# Patient Record
Sex: Male | Born: 1940
Health system: Southern US, Community
[De-identification: ages and names within clinical notes are randomized; demographics above are authoritative.]

## PROBLEM LIST (undated history)

## (undated) DIAGNOSIS — Z5189 Encounter for other specified aftercare: Secondary | ICD-10-CM

## (undated) DIAGNOSIS — G4730 Sleep apnea, unspecified: Secondary | ICD-10-CM

## (undated) DIAGNOSIS — G2581 Restless legs syndrome: Secondary | ICD-10-CM

## (undated) DIAGNOSIS — Z8719 Personal history of other diseases of the digestive system: Secondary | ICD-10-CM

## (undated) DIAGNOSIS — C801 Malignant (primary) neoplasm, unspecified: Secondary | ICD-10-CM

## (undated) DIAGNOSIS — R0600 Dyspnea, unspecified: Secondary | ICD-10-CM

## (undated) DIAGNOSIS — D519 Vitamin B12 deficiency anemia, unspecified: Secondary | ICD-10-CM

## (undated) DIAGNOSIS — K635 Polyp of colon: Secondary | ICD-10-CM

## (undated) DIAGNOSIS — Z8551 Personal history of malignant neoplasm of bladder: Secondary | ICD-10-CM

## (undated) DIAGNOSIS — K2270 Barrett's esophagus without dysplasia: Secondary | ICD-10-CM

## (undated) DIAGNOSIS — J189 Pneumonia, unspecified organism: Secondary | ICD-10-CM

## (undated) DIAGNOSIS — I2510 Atherosclerotic heart disease of native coronary artery without angina pectoris: Secondary | ICD-10-CM

## (undated) DIAGNOSIS — F329 Major depressive disorder, single episode, unspecified: Secondary | ICD-10-CM

## (undated) DIAGNOSIS — E119 Type 2 diabetes mellitus without complications: Secondary | ICD-10-CM

## (undated) DIAGNOSIS — M1710 Unilateral primary osteoarthritis, unspecified knee: Secondary | ICD-10-CM

## (undated) DIAGNOSIS — E785 Hyperlipidemia, unspecified: Secondary | ICD-10-CM

## (undated) DIAGNOSIS — K219 Gastro-esophageal reflux disease without esophagitis: Secondary | ICD-10-CM

## (undated) DIAGNOSIS — J449 Chronic obstructive pulmonary disease, unspecified: Secondary | ICD-10-CM

## (undated) DIAGNOSIS — N189 Chronic kidney disease, unspecified: Secondary | ICD-10-CM

## (undated) DIAGNOSIS — I10 Essential (primary) hypertension: Secondary | ICD-10-CM

## (undated) DIAGNOSIS — K222 Esophageal obstruction: Secondary | ICD-10-CM

## (undated) DIAGNOSIS — F32A Depression, unspecified: Secondary | ICD-10-CM

## (undated) DIAGNOSIS — T7840XA Allergy, unspecified, initial encounter: Secondary | ICD-10-CM

## (undated) DIAGNOSIS — R519 Headache, unspecified: Secondary | ICD-10-CM

## (undated) DIAGNOSIS — F419 Anxiety disorder, unspecified: Secondary | ICD-10-CM

## (undated) HISTORY — DX: Personal history of malignant neoplasm of bladder: Z85.51

## (undated) HISTORY — DX: Vitamin B12 deficiency anemia, unspecified: D51.9

## (undated) HISTORY — PX: CORONARY ARTERY BYPASS GRAFT: SHX141

## (undated) HISTORY — PX: CERVICAL DISCECTOMY: SHX98

## (undated) HISTORY — DX: Atherosclerotic heart disease of native coronary artery without angina pectoris: I25.10

## (undated) HISTORY — DX: Essential (primary) hypertension: I10

## (undated) HISTORY — DX: Allergy, unspecified, initial encounter: T78.40XA

## (undated) HISTORY — PX: LUMBAR LAMINECTOMY: SHX95

## (undated) HISTORY — DX: Barrett's esophagus without dysplasia: K22.70

## (undated) HISTORY — PX: POPLITEAL SYNOVIAL CYST EXCISION: SUR555

## (undated) HISTORY — PX: KNEE ARTHROSCOPY: SUR90

## (undated) HISTORY — DX: Gastro-esophageal reflux disease without esophagitis: K21.9

## (undated) HISTORY — DX: Depression, unspecified: F32.A

## (undated) HISTORY — PX: CHOLECYSTECTOMY: SHX55

## (undated) HISTORY — DX: Major depressive disorder, single episode, unspecified: F32.9

## (undated) HISTORY — DX: Sleep apnea, unspecified: G47.30

## (undated) HISTORY — DX: Unilateral primary osteoarthritis, unspecified knee: M17.10

## (undated) HISTORY — DX: Chronic obstructive pulmonary disease, unspecified: J44.9

## (undated) HISTORY — DX: Encounter for other specified aftercare: Z51.89

## (undated) HISTORY — DX: Polyp of colon: K63.5

## (undated) HISTORY — DX: Malignant (primary) neoplasm, unspecified: C80.1

## (undated) HISTORY — PX: NASAL SINUS SURGERY: SHX719

## (undated) HISTORY — PX: OTHER SURGICAL HISTORY: SHX169

## (undated) HISTORY — DX: Hyperlipidemia, unspecified: E78.5

## (undated) HISTORY — DX: Esophageal obstruction: K22.2

---

## 1997-07-16 ENCOUNTER — Ambulatory Visit (HOSPITAL_COMMUNITY): Admission: RE | Admit: 1997-07-16 | Discharge: 1997-07-16 | Payer: Self-pay | Admitting: Internal Medicine

## 1997-07-19 ENCOUNTER — Ambulatory Visit (HOSPITAL_COMMUNITY): Admission: RE | Admit: 1997-07-19 | Discharge: 1997-07-19 | Payer: Self-pay | Admitting: Gastroenterology

## 1997-08-02 ENCOUNTER — Ambulatory Visit (HOSPITAL_COMMUNITY): Admission: RE | Admit: 1997-08-02 | Discharge: 1997-08-02 | Payer: Self-pay | Admitting: Neurosurgery

## 1998-06-07 ENCOUNTER — Ambulatory Visit (HOSPITAL_COMMUNITY): Admission: RE | Admit: 1998-06-07 | Discharge: 1998-06-07 | Payer: Self-pay | Admitting: Neurosurgery

## 1998-09-19 ENCOUNTER — Observation Stay (HOSPITAL_COMMUNITY): Admission: RE | Admit: 1998-09-19 | Discharge: 1998-09-20 | Payer: Self-pay | Admitting: Neurosurgery

## 1998-09-22 ENCOUNTER — Inpatient Hospital Stay (HOSPITAL_COMMUNITY): Admission: EM | Admit: 1998-09-22 | Discharge: 1998-09-26 | Payer: Self-pay | Admitting: Emergency Medicine

## 1998-09-22 ENCOUNTER — Encounter: Payer: Self-pay | Admitting: Neurosurgery

## 1998-09-24 ENCOUNTER — Encounter: Payer: Self-pay | Admitting: Neurosurgery

## 1998-11-19 ENCOUNTER — Encounter: Admission: RE | Admit: 1998-11-19 | Discharge: 1998-11-19 | Payer: Self-pay | Admitting: Neurosurgery

## 1998-12-20 ENCOUNTER — Encounter: Payer: Self-pay | Admitting: Neurosurgery

## 1998-12-20 ENCOUNTER — Ambulatory Visit (HOSPITAL_COMMUNITY): Admission: RE | Admit: 1998-12-20 | Discharge: 1998-12-20 | Payer: Self-pay | Admitting: Neurosurgery

## 1999-01-03 ENCOUNTER — Ambulatory Visit (HOSPITAL_COMMUNITY): Admission: RE | Admit: 1999-01-03 | Discharge: 1999-01-03 | Payer: Self-pay | Admitting: Neurosurgery

## 1999-01-03 ENCOUNTER — Encounter: Payer: Self-pay | Admitting: Neurosurgery

## 1999-01-10 ENCOUNTER — Encounter: Admission: RE | Admit: 1999-01-10 | Discharge: 1999-01-10 | Payer: Self-pay | Admitting: Neurosurgery

## 1999-01-17 ENCOUNTER — Encounter: Payer: Self-pay | Admitting: Neurosurgery

## 1999-01-17 ENCOUNTER — Ambulatory Visit (HOSPITAL_COMMUNITY): Admission: RE | Admit: 1999-01-17 | Discharge: 1999-01-17 | Payer: Self-pay | Admitting: Neurosurgery

## 1999-03-18 ENCOUNTER — Encounter: Admission: RE | Admit: 1999-03-18 | Discharge: 1999-03-18 | Payer: Self-pay | Admitting: Neurosurgery

## 2000-03-24 ENCOUNTER — Encounter: Payer: Self-pay | Admitting: Cardiovascular Disease

## 2000-03-25 ENCOUNTER — Inpatient Hospital Stay (HOSPITAL_COMMUNITY): Admission: RE | Admit: 2000-03-25 | Discharge: 2000-03-31 | Payer: Self-pay | Admitting: Cardiovascular Disease

## 2000-03-27 ENCOUNTER — Encounter: Payer: Self-pay | Admitting: Thoracic Surgery (Cardiothoracic Vascular Surgery)

## 2000-03-28 ENCOUNTER — Encounter: Payer: Self-pay | Admitting: Thoracic Surgery (Cardiothoracic Vascular Surgery)

## 2000-03-29 ENCOUNTER — Encounter: Payer: Self-pay | Admitting: Cardiothoracic Surgery

## 2000-05-11 ENCOUNTER — Encounter (HOSPITAL_COMMUNITY): Admission: RE | Admit: 2000-05-11 | Discharge: 2000-08-09 | Payer: Self-pay | Admitting: Cardiovascular Disease

## 2000-07-26 ENCOUNTER — Encounter: Payer: Self-pay | Admitting: Cardiovascular Disease

## 2000-07-26 ENCOUNTER — Ambulatory Visit (HOSPITAL_COMMUNITY): Admission: AD | Admit: 2000-07-26 | Discharge: 2000-07-26 | Payer: Self-pay | Admitting: Cardiovascular Disease

## 2000-08-11 ENCOUNTER — Ambulatory Visit (HOSPITAL_COMMUNITY): Admission: RE | Admit: 2000-08-11 | Discharge: 2000-08-11 | Payer: Self-pay | Admitting: Urology

## 2000-08-11 ENCOUNTER — Encounter (INDEPENDENT_AMBULATORY_CARE_PROVIDER_SITE_OTHER): Payer: Self-pay | Admitting: Specialist

## 2000-11-04 ENCOUNTER — Other Ambulatory Visit: Admission: RE | Admit: 2000-11-04 | Discharge: 2000-11-04 | Payer: Self-pay | Admitting: Gastroenterology

## 2000-11-04 ENCOUNTER — Encounter (INDEPENDENT_AMBULATORY_CARE_PROVIDER_SITE_OTHER): Payer: Self-pay | Admitting: Specialist

## 2000-11-04 DIAGNOSIS — D126 Benign neoplasm of colon, unspecified: Secondary | ICD-10-CM | POA: Insufficient documentation

## 2001-02-16 ENCOUNTER — Ambulatory Visit (HOSPITAL_COMMUNITY): Admission: RE | Admit: 2001-02-16 | Discharge: 2001-02-16 | Payer: Self-pay | Admitting: Cardiovascular Disease

## 2001-10-25 ENCOUNTER — Encounter: Payer: Self-pay | Admitting: Gastroenterology

## 2001-10-25 ENCOUNTER — Ambulatory Visit (HOSPITAL_COMMUNITY): Admission: RE | Admit: 2001-10-25 | Discharge: 2001-10-25 | Payer: Self-pay | Admitting: Gastroenterology

## 2001-12-28 ENCOUNTER — Ambulatory Visit (HOSPITAL_BASED_OUTPATIENT_CLINIC_OR_DEPARTMENT_OTHER): Admission: RE | Admit: 2001-12-28 | Discharge: 2001-12-28 | Payer: Self-pay | Admitting: Urology

## 2001-12-28 ENCOUNTER — Encounter (INDEPENDENT_AMBULATORY_CARE_PROVIDER_SITE_OTHER): Payer: Self-pay | Admitting: *Deleted

## 2002-04-07 ENCOUNTER — Inpatient Hospital Stay (HOSPITAL_COMMUNITY): Admission: AD | Admit: 2002-04-07 | Discharge: 2002-04-11 | Payer: Self-pay | Admitting: Cardiovascular Disease

## 2002-04-10 ENCOUNTER — Encounter: Payer: Self-pay | Admitting: Cardiovascular Disease

## 2002-04-11 ENCOUNTER — Encounter (INDEPENDENT_AMBULATORY_CARE_PROVIDER_SITE_OTHER): Payer: Self-pay | Admitting: Specialist

## 2002-07-27 ENCOUNTER — Encounter (INDEPENDENT_AMBULATORY_CARE_PROVIDER_SITE_OTHER): Payer: Self-pay | Admitting: Specialist

## 2002-07-27 ENCOUNTER — Ambulatory Visit (HOSPITAL_COMMUNITY): Admission: RE | Admit: 2002-07-27 | Discharge: 2002-07-27 | Payer: Self-pay | Admitting: Gastroenterology

## 2002-07-27 ENCOUNTER — Encounter: Payer: Self-pay | Admitting: Gastroenterology

## 2002-07-27 DIAGNOSIS — K209 Esophagitis, unspecified without bleeding: Secondary | ICD-10-CM | POA: Insufficient documentation

## 2003-01-01 ENCOUNTER — Inpatient Hospital Stay (HOSPITAL_COMMUNITY): Admission: RE | Admit: 2003-01-01 | Discharge: 2003-01-02 | Payer: Self-pay | Admitting: Neurosurgery

## 2003-03-27 ENCOUNTER — Ambulatory Visit (HOSPITAL_COMMUNITY): Admission: RE | Admit: 2003-03-27 | Discharge: 2003-03-27 | Payer: Self-pay | Admitting: Neurosurgery

## 2003-04-05 ENCOUNTER — Inpatient Hospital Stay (HOSPITAL_COMMUNITY): Admission: RE | Admit: 2003-04-05 | Discharge: 2003-04-06 | Payer: Self-pay | Admitting: Neurosurgery

## 2003-05-07 ENCOUNTER — Ambulatory Visit (HOSPITAL_BASED_OUTPATIENT_CLINIC_OR_DEPARTMENT_OTHER): Admission: RE | Admit: 2003-05-07 | Discharge: 2003-05-07 | Payer: Self-pay | Admitting: Urology

## 2003-05-07 ENCOUNTER — Ambulatory Visit (HOSPITAL_COMMUNITY): Admission: RE | Admit: 2003-05-07 | Discharge: 2003-05-07 | Payer: Self-pay | Admitting: Urology

## 2003-05-07 ENCOUNTER — Encounter (INDEPENDENT_AMBULATORY_CARE_PROVIDER_SITE_OTHER): Payer: Self-pay | Admitting: Specialist

## 2003-11-25 ENCOUNTER — Ambulatory Visit: Payer: Self-pay | Admitting: Oncology

## 2003-11-26 ENCOUNTER — Encounter: Admission: RE | Admit: 2003-11-26 | Discharge: 2003-11-26 | Payer: Self-pay | Admitting: Internal Medicine

## 2003-11-26 ENCOUNTER — Ambulatory Visit: Payer: Self-pay | Admitting: Internal Medicine

## 2003-12-03 ENCOUNTER — Ambulatory Visit: Payer: Self-pay | Admitting: Internal Medicine

## 2004-01-07 ENCOUNTER — Ambulatory Visit: Payer: Self-pay | Admitting: Internal Medicine

## 2004-01-31 ENCOUNTER — Ambulatory Visit: Payer: Self-pay | Admitting: Pulmonary Disease

## 2004-01-31 ENCOUNTER — Ambulatory Visit (HOSPITAL_BASED_OUTPATIENT_CLINIC_OR_DEPARTMENT_OTHER): Admission: RE | Admit: 2004-01-31 | Discharge: 2004-01-31 | Payer: Self-pay | Admitting: Internal Medicine

## 2004-02-25 ENCOUNTER — Ambulatory Visit: Payer: Self-pay | Admitting: Oncology

## 2004-03-24 ENCOUNTER — Ambulatory Visit: Payer: Self-pay | Admitting: Internal Medicine

## 2004-05-22 ENCOUNTER — Ambulatory Visit: Payer: Self-pay | Admitting: Internal Medicine

## 2004-05-22 ENCOUNTER — Ambulatory Visit (HOSPITAL_COMMUNITY): Admission: RE | Admit: 2004-05-22 | Discharge: 2004-05-22 | Payer: Self-pay | Admitting: Otolaryngology

## 2004-05-26 ENCOUNTER — Ambulatory Visit: Payer: Self-pay | Admitting: Oncology

## 2004-06-02 ENCOUNTER — Ambulatory Visit (HOSPITAL_COMMUNITY): Admission: RE | Admit: 2004-06-02 | Discharge: 2004-06-02 | Payer: Self-pay | Admitting: Oncology

## 2004-06-06 ENCOUNTER — Encounter (INDEPENDENT_AMBULATORY_CARE_PROVIDER_SITE_OTHER): Payer: Self-pay | Admitting: *Deleted

## 2004-06-06 ENCOUNTER — Ambulatory Visit (HOSPITAL_COMMUNITY): Admission: RE | Admit: 2004-06-06 | Discharge: 2004-06-07 | Payer: Self-pay | Admitting: Otolaryngology

## 2004-07-03 ENCOUNTER — Ambulatory Visit: Payer: Self-pay | Admitting: Internal Medicine

## 2004-08-25 ENCOUNTER — Ambulatory Visit: Payer: Self-pay | Admitting: Oncology

## 2004-09-01 ENCOUNTER — Ambulatory Visit: Payer: Self-pay | Admitting: Internal Medicine

## 2004-10-10 ENCOUNTER — Emergency Department (HOSPITAL_COMMUNITY): Admission: EM | Admit: 2004-10-10 | Discharge: 2004-10-10 | Payer: Self-pay | Admitting: Emergency Medicine

## 2004-10-20 ENCOUNTER — Ambulatory Visit: Payer: Self-pay | Admitting: Oncology

## 2004-11-05 ENCOUNTER — Ambulatory Visit: Payer: Self-pay | Admitting: Internal Medicine

## 2004-11-12 ENCOUNTER — Ambulatory Visit: Payer: Self-pay | Admitting: Gastroenterology

## 2004-11-13 ENCOUNTER — Ambulatory Visit: Payer: Self-pay | Admitting: Gastroenterology

## 2005-01-05 ENCOUNTER — Ambulatory Visit: Payer: Self-pay | Admitting: Internal Medicine

## 2005-03-23 ENCOUNTER — Ambulatory Visit: Payer: Self-pay | Admitting: Internal Medicine

## 2005-03-30 ENCOUNTER — Ambulatory Visit: Payer: Self-pay | Admitting: Internal Medicine

## 2005-04-22 ENCOUNTER — Ambulatory Visit: Payer: Self-pay | Admitting: Oncology

## 2005-04-23 LAB — IRON AND TIBC
%SAT: 51 % (ref 20–55)
Iron: 161 ug/dL (ref 42–165)
TIBC: 314 ug/dL (ref 215–435)
UIBC: 153 ug/dL

## 2005-04-23 LAB — CBC WITH DIFFERENTIAL/PLATELET
BASO%: 0.6 % (ref 0.0–2.0)
Basophils Absolute: 0 10*3/uL (ref 0.0–0.1)
EOS%: 3.4 % (ref 0.0–7.0)
Eosinophils Absolute: 0.2 10*3/uL (ref 0.0–0.5)
HCT: 39 % (ref 38.7–49.9)
HGB: 13.4 g/dL (ref 13.0–17.1)
LYMPH%: 35.2 % (ref 14.0–48.0)
MCH: 31.7 pg (ref 28.0–33.4)
MCHC: 34.3 g/dL (ref 32.0–35.9)
MCV: 92.4 fL (ref 81.6–98.0)
MONO#: 0.5 10*3/uL (ref 0.1–0.9)
MONO%: 10.1 % (ref 0.0–13.0)
NEUT#: 2.6 10*3/uL (ref 1.5–6.5)
NEUT%: 50.7 % (ref 40.0–75.0)
Platelets: 206 10*3/uL (ref 145–400)
RBC: 4.22 10*6/uL (ref 4.20–5.71)
RDW: 12.6 % (ref 11.2–14.6)
WBC: 5.1 10*3/uL (ref 4.0–10.0)
lymph#: 1.8 10*3/uL (ref 0.9–3.3)

## 2005-04-23 LAB — FERRITIN: Ferritin: 752 ng/mL — ABNORMAL HIGH (ref 22–322)

## 2005-05-27 ENCOUNTER — Ambulatory Visit: Payer: Self-pay | Admitting: Internal Medicine

## 2005-06-03 ENCOUNTER — Ambulatory Visit: Payer: Self-pay | Admitting: Internal Medicine

## 2005-06-24 ENCOUNTER — Ambulatory Visit (HOSPITAL_COMMUNITY): Admission: RE | Admit: 2005-06-24 | Discharge: 2005-06-24 | Payer: Self-pay | Admitting: Oncology

## 2005-07-07 ENCOUNTER — Ambulatory Visit: Payer: Self-pay | Admitting: Internal Medicine

## 2005-08-18 ENCOUNTER — Ambulatory Visit: Payer: Self-pay | Admitting: Internal Medicine

## 2005-09-03 ENCOUNTER — Ambulatory Visit: Payer: Self-pay | Admitting: Internal Medicine

## 2005-09-23 ENCOUNTER — Ambulatory Visit: Payer: Self-pay | Admitting: Internal Medicine

## 2005-10-16 ENCOUNTER — Ambulatory Visit: Payer: Self-pay | Admitting: Oncology

## 2005-10-23 ENCOUNTER — Ambulatory Visit: Payer: Self-pay | Admitting: Internal Medicine

## 2005-11-04 ENCOUNTER — Ambulatory Visit: Payer: Self-pay | Admitting: Gastroenterology

## 2005-11-12 ENCOUNTER — Ambulatory Visit: Payer: Self-pay | Admitting: Gastroenterology

## 2005-11-17 ENCOUNTER — Ambulatory Visit: Payer: Self-pay | Admitting: Gastroenterology

## 2005-11-17 HISTORY — PX: COLONOSCOPY: SHX174

## 2005-11-20 ENCOUNTER — Ambulatory Visit: Payer: Self-pay | Admitting: Internal Medicine

## 2005-12-18 ENCOUNTER — Ambulatory Visit: Payer: Self-pay | Admitting: Internal Medicine

## 2006-01-05 ENCOUNTER — Ambulatory Visit: Payer: Self-pay | Admitting: Internal Medicine

## 2006-02-05 ENCOUNTER — Ambulatory Visit: Payer: Self-pay | Admitting: Internal Medicine

## 2006-03-17 ENCOUNTER — Ambulatory Visit: Payer: Self-pay | Admitting: Internal Medicine

## 2006-04-19 ENCOUNTER — Ambulatory Visit: Payer: Self-pay | Admitting: Internal Medicine

## 2006-04-19 ENCOUNTER — Ambulatory Visit: Payer: Self-pay | Admitting: Oncology

## 2006-04-21 LAB — CBC WITH DIFFERENTIAL/PLATELET
BASO%: 0.5 % (ref 0.0–2.0)
Basophils Absolute: 0 10*3/uL (ref 0.0–0.1)
EOS%: 5 % (ref 0.0–7.0)
Eosinophils Absolute: 0.3 10*3/uL (ref 0.0–0.5)
HCT: 37.6 % — ABNORMAL LOW (ref 38.7–49.9)
HGB: 13.2 g/dL (ref 13.0–17.1)
LYMPH%: 37 % (ref 14.0–48.0)
MCH: 31.9 pg (ref 28.0–33.4)
MCHC: 35 g/dL (ref 32.0–35.9)
MCV: 91.1 fL (ref 81.6–98.0)
MONO#: 0.6 10*3/uL (ref 0.1–0.9)
MONO%: 9.9 % (ref 0.0–13.0)
NEUT#: 2.7 10*3/uL (ref 1.5–6.5)
NEUT%: 47.6 % (ref 40.0–75.0)
Platelets: 225 10*3/uL (ref 145–400)
RBC: 4.13 10*6/uL — ABNORMAL LOW (ref 4.20–5.71)
RDW: 14 % (ref 11.2–14.6)
WBC: 5.6 10*3/uL (ref 4.0–10.0)
lymph#: 2.1 10*3/uL (ref 0.9–3.3)

## 2006-04-21 LAB — COMPREHENSIVE METABOLIC PANEL
ALT: 15 U/L (ref 0–53)
AST: 16 U/L (ref 0–37)
Albumin: 4.1 g/dL (ref 3.5–5.2)
Alkaline Phosphatase: 60 U/L (ref 39–117)
BUN: 25 mg/dL — ABNORMAL HIGH (ref 6–23)
CO2: 26 mEq/L (ref 19–32)
Calcium: 9.1 mg/dL (ref 8.4–10.5)
Chloride: 105 mEq/L (ref 96–112)
Creatinine, Ser: 1.1 mg/dL (ref 0.40–1.50)
Glucose, Bld: 145 mg/dL — ABNORMAL HIGH (ref 70–99)
Potassium: 4.2 mEq/L (ref 3.5–5.3)
Sodium: 140 mEq/L (ref 135–145)
Total Bilirubin: 0.4 mg/dL (ref 0.3–1.2)
Total Protein: 7.3 g/dL (ref 6.0–8.3)

## 2006-04-21 LAB — IRON AND TIBC
%SAT: 32 % (ref 20–55)
Iron: 80 ug/dL (ref 42–165)
TIBC: 248 ug/dL (ref 215–435)
UIBC: 168 ug/dL

## 2006-04-21 LAB — LACTATE DEHYDROGENASE: LDH: 126 U/L (ref 94–250)

## 2006-04-21 LAB — FERRITIN: Ferritin: 871 ng/mL — ABNORMAL HIGH (ref 22–322)

## 2006-04-26 ENCOUNTER — Ambulatory Visit: Payer: Self-pay | Admitting: Internal Medicine

## 2006-04-28 ENCOUNTER — Ambulatory Visit: Payer: Self-pay | Admitting: Gastroenterology

## 2006-04-28 LAB — CONVERTED CEMR LAB
ALT: 17 units/L (ref 0–40)
AST: 20 units/L (ref 0–37)
Albumin: 3.4 g/dL — ABNORMAL LOW (ref 3.5–5.2)
Alkaline Phosphatase: 56 units/L (ref 39–117)
BUN: 21 mg/dL (ref 6–23)
Bilirubin, Direct: 0.1 mg/dL (ref 0.0–0.3)
CO2: 31 meq/L (ref 19–32)
Calcium: 9 mg/dL (ref 8.4–10.5)
Chloride: 109 meq/L (ref 96–112)
Creatinine, Ser: 1 mg/dL (ref 0.4–1.5)
Folate: >20 ng/mL
GFR calc Af Amer: 97 mL/min
GFR calc non Af Amer: 80 mL/min
Glucose, Bld: 115 mg/dL — ABNORMAL HIGH (ref 70–99)
INR: 1 (ref 0.9–2.0)
Iron: 113 ug/dL (ref 42–165)
Potassium: 4.2 meq/L (ref 3.5–5.1)
Prothrombin Time: 12.4 s (ref 10.0–14.0)
Saturation Ratios: 42.3 % (ref 20.0–50.0)
Sodium: 143 meq/L (ref 135–145)
Total Bilirubin: 0.7 mg/dL (ref 0.3–1.2)
Total Protein: 6.8 g/dL (ref 6.0–8.3)
Transferrin: 191 mg/dL — ABNORMAL LOW (ref >=212.0)
Vitamin B-12: 862 pg/mL (ref 211–911)
aPTT: 27.8 s (ref 26.5–36.5)

## 2006-05-18 ENCOUNTER — Ambulatory Visit (HOSPITAL_COMMUNITY): Admission: RE | Admit: 2006-05-18 | Discharge: 2006-05-18 | Payer: Self-pay | Admitting: Gastroenterology

## 2006-05-18 ENCOUNTER — Encounter (INDEPENDENT_AMBULATORY_CARE_PROVIDER_SITE_OTHER): Payer: Self-pay | Admitting: *Deleted

## 2006-05-20 ENCOUNTER — Ambulatory Visit: Payer: Self-pay | Admitting: Gastroenterology

## 2006-05-28 ENCOUNTER — Ambulatory Visit: Payer: Self-pay | Admitting: Internal Medicine

## 2006-06-09 ENCOUNTER — Ambulatory Visit: Payer: Self-pay | Admitting: Gastroenterology

## 2006-06-16 ENCOUNTER — Ambulatory Visit: Payer: Self-pay | Admitting: Gastroenterology

## 2006-06-16 LAB — CONVERTED CEMR LAB
Fecal Occult Blood: NEGATIVE
OCCULT 1: NEGATIVE
OCCULT 2: NEGATIVE
OCCULT 3: NEGATIVE
OCCULT 4: NEGATIVE
OCCULT 5: NEGATIVE

## 2006-06-24 ENCOUNTER — Ambulatory Visit: Payer: Self-pay | Admitting: Internal Medicine

## 2006-07-29 ENCOUNTER — Ambulatory Visit: Payer: Self-pay | Admitting: Internal Medicine

## 2006-07-29 ENCOUNTER — Encounter: Payer: Self-pay | Admitting: *Deleted

## 2006-07-29 DIAGNOSIS — G608 Other hereditary and idiopathic neuropathies: Secondary | ICD-10-CM | POA: Insufficient documentation

## 2006-07-29 DIAGNOSIS — M545 Low back pain, unspecified: Secondary | ICD-10-CM | POA: Insufficient documentation

## 2006-07-29 DIAGNOSIS — J309 Allergic rhinitis, unspecified: Secondary | ICD-10-CM | POA: Insufficient documentation

## 2006-07-29 DIAGNOSIS — F32A Depression, unspecified: Secondary | ICD-10-CM | POA: Insufficient documentation

## 2006-07-29 DIAGNOSIS — F329 Major depressive disorder, single episode, unspecified: Secondary | ICD-10-CM | POA: Insufficient documentation

## 2006-08-13 ENCOUNTER — Encounter: Payer: Self-pay | Admitting: Internal Medicine

## 2006-09-08 ENCOUNTER — Ambulatory Visit: Payer: Self-pay | Admitting: Internal Medicine

## 2006-09-08 DIAGNOSIS — D518 Other vitamin B12 deficiency anemias: Secondary | ICD-10-CM | POA: Insufficient documentation

## 2006-09-22 ENCOUNTER — Encounter: Payer: Self-pay | Admitting: Internal Medicine

## 2006-10-12 ENCOUNTER — Ambulatory Visit: Payer: Self-pay | Admitting: Internal Medicine

## 2006-10-12 DIAGNOSIS — I10 Essential (primary) hypertension: Secondary | ICD-10-CM | POA: Insufficient documentation

## 2006-10-12 LAB — CONVERTED CEMR LAB
Cholesterol, target level: 200 mg/dL
HDL goal, serum: 40 mg/dL
LDL Goal: 100 mg/dL

## 2006-10-18 ENCOUNTER — Ambulatory Visit: Payer: Self-pay | Admitting: Oncology

## 2006-10-21 ENCOUNTER — Encounter: Payer: Self-pay | Admitting: Internal Medicine

## 2006-10-21 LAB — CBC WITH DIFFERENTIAL/PLATELET
BASO%: 0.1 % (ref 0.0–2.0)
Basophils Absolute: 0 10*3/uL (ref 0.0–0.1)
EOS%: 3.7 % (ref 0.0–7.0)
Eosinophils Absolute: 0.2 10*3/uL (ref 0.0–0.5)
HCT: 34.5 % — ABNORMAL LOW (ref 38.7–49.9)
HGB: 12.3 g/dL — ABNORMAL LOW (ref 13.0–17.1)
LYMPH%: 35 % (ref 14.0–48.0)
MCH: 33.2 pg (ref 28.0–33.4)
MCHC: 35.6 g/dL (ref 32.0–35.9)
MCV: 93.3 fL (ref 81.6–98.0)
MONO#: 0.5 10*3/uL (ref 0.1–0.9)
MONO%: 8.6 % (ref 0.0–13.0)
NEUT#: 2.8 10*3/uL (ref 1.5–6.5)
NEUT%: 52.6 % (ref 40.0–75.0)
Platelets: 146 10*3/uL (ref 145–400)
RBC: 3.7 10*6/uL — ABNORMAL LOW (ref 4.20–5.71)
RDW: 13.5 % (ref 11.2–14.6)
WBC: 5.2 10*3/uL (ref 4.0–10.0)
lymph#: 1.8 10*3/uL (ref 0.9–3.3)

## 2006-10-21 LAB — COMPREHENSIVE METABOLIC PANEL
ALT: 15 U/L (ref 0–53)
AST: 16 U/L (ref 0–37)
Albumin: 3.7 g/dL (ref 3.5–5.2)
Alkaline Phosphatase: 50 U/L (ref 39–117)
BUN: 18 mg/dL (ref 6–23)
CO2: 26 mEq/L (ref 19–32)
Calcium: 8.7 mg/dL (ref 8.4–10.5)
Chloride: 106 mEq/L (ref 96–112)
Creatinine, Ser: 1.04 mg/dL (ref 0.40–1.50)
Glucose, Bld: 151 mg/dL — ABNORMAL HIGH (ref 70–99)
Potassium: 4 mEq/L (ref 3.5–5.3)
Sodium: 142 mEq/L (ref 135–145)
Total Bilirubin: 0.4 mg/dL (ref 0.3–1.2)
Total Protein: 6 g/dL (ref 6.0–8.3)

## 2006-10-21 LAB — LACTATE DEHYDROGENASE: LDH: 130 U/L (ref 94–250)

## 2006-10-21 LAB — IRON AND TIBC
%SAT: 35 % (ref 20–55)
Iron: 86 ug/dL (ref 42–165)
TIBC: 246 ug/dL (ref 215–435)
UIBC: 160 ug/dL

## 2006-10-21 LAB — FERRITIN: Ferritin: 394 ng/mL — ABNORMAL HIGH (ref 22–322)

## 2006-11-10 ENCOUNTER — Ambulatory Visit: Payer: Self-pay | Admitting: Internal Medicine

## 2006-11-24 ENCOUNTER — Ambulatory Visit: Payer: Self-pay | Admitting: Internal Medicine

## 2006-12-13 ENCOUNTER — Ambulatory Visit: Payer: Self-pay | Admitting: Internal Medicine

## 2006-12-13 LAB — CONVERTED CEMR LAB
BUN: 23 mg/dL (ref 6–23)
CO2: 32 meq/L (ref 19–32)
Calcium: 9.6 mg/dL (ref 8.4–10.5)
Chloride: 104 meq/L (ref 96–112)
Creatinine, Ser: 1.3 mg/dL (ref 0.4–1.5)
GFR calc Af Amer: 71 mL/min
GFR calc non Af Amer: 59 mL/min
Glucose, Bld: 148 mg/dL — ABNORMAL HIGH (ref 70–99)
Potassium: 5.1 meq/L (ref 3.5–5.1)
Sodium: 143 meq/L (ref 135–145)

## 2007-01-05 ENCOUNTER — Ambulatory Visit (HOSPITAL_BASED_OUTPATIENT_CLINIC_OR_DEPARTMENT_OTHER): Admission: RE | Admit: 2007-01-05 | Discharge: 2007-01-05 | Payer: Self-pay | Admitting: Urology

## 2007-01-05 ENCOUNTER — Encounter (INDEPENDENT_AMBULATORY_CARE_PROVIDER_SITE_OTHER): Payer: Self-pay | Admitting: Urology

## 2007-02-25 ENCOUNTER — Ambulatory Visit: Payer: Self-pay | Admitting: Internal Medicine

## 2007-04-08 ENCOUNTER — Ambulatory Visit: Payer: Self-pay | Admitting: Internal Medicine

## 2007-04-08 DIAGNOSIS — E039 Hypothyroidism, unspecified: Secondary | ICD-10-CM | POA: Insufficient documentation

## 2007-04-08 DIAGNOSIS — R5383 Other fatigue: Secondary | ICD-10-CM | POA: Insufficient documentation

## 2007-04-08 DIAGNOSIS — R5381 Other malaise: Secondary | ICD-10-CM | POA: Insufficient documentation

## 2007-04-08 LAB — CONVERTED CEMR LAB
ALT: 22 units/L (ref 0–53)
AST: 19 units/L (ref 0–37)
Albumin: 3.8 g/dL (ref 3.5–5.2)
Alkaline Phosphatase: 61 units/L (ref 39–117)
Chloride: 101 meq/L (ref 96–112)
Cholesterol: 146 mg/dL (ref 0–200)
Direct LDL: 73.7 mg/dL
GFR calc Af Amer: 97 mL/min
GFR calc non Af Amer: 80 mL/min
Potassium: 5.1 meq/L (ref 3.5–5.1)
TSH: 2.72 microintl units/mL (ref 0.35–5.50)
Total Bilirubin: 0.8 mg/dL (ref 0.3–1.2)
Total Protein: 6.9 g/dL (ref 6.0–8.3)

## 2007-04-11 ENCOUNTER — Ambulatory Visit (HOSPITAL_COMMUNITY): Admission: RE | Admit: 2007-04-11 | Discharge: 2007-04-11 | Payer: Self-pay | Admitting: Urology

## 2007-04-11 ENCOUNTER — Encounter: Payer: Self-pay | Admitting: Urology

## 2007-04-19 ENCOUNTER — Ambulatory Visit: Payer: Self-pay | Admitting: Gastroenterology

## 2007-04-21 ENCOUNTER — Ambulatory Visit: Payer: Self-pay | Admitting: Oncology

## 2007-04-25 ENCOUNTER — Ambulatory Visit: Payer: Self-pay | Admitting: Internal Medicine

## 2007-04-25 DIAGNOSIS — M1710 Unilateral primary osteoarthritis, unspecified knee: Secondary | ICD-10-CM | POA: Insufficient documentation

## 2007-04-25 LAB — COMPREHENSIVE METABOLIC PANEL
ALT: 13 U/L (ref 0–53)
AST: 14 U/L (ref 0–37)
Albumin: 4.2 g/dL (ref 3.5–5.2)
Calcium: 9.5 mg/dL (ref 8.4–10.5)
Chloride: 105 mEq/L (ref 96–112)
Creatinine, Ser: 0.98 mg/dL (ref 0.40–1.50)
Potassium: 4.7 mEq/L (ref 3.5–5.3)
Sodium: 140 mEq/L (ref 135–145)
Total Protein: 7.3 g/dL (ref 6.0–8.3)

## 2007-04-25 LAB — CBC WITH DIFFERENTIAL/PLATELET
Basophils Absolute: 0 10*3/uL (ref 0.0–0.1)
Eosinophils Absolute: 0 10*3/uL (ref 0.0–0.5)
HGB: 13 g/dL (ref 13.0–17.1)
MCV: 89 fL (ref 81.6–98.0)
MONO#: 0.6 10*3/uL (ref 0.1–0.9)
NEUT#: 3 10*3/uL (ref 1.5–6.5)
RBC: 4.16 10*6/uL — ABNORMAL LOW (ref 4.20–5.71)
RDW: 13.8 % (ref 11.2–14.6)
WBC: 6.1 10*3/uL (ref 4.0–10.0)

## 2007-04-25 LAB — IRON AND TIBC: TIBC: 274 ug/dL (ref 215–435)

## 2007-05-03 ENCOUNTER — Ambulatory Visit: Payer: Self-pay | Admitting: Gastroenterology

## 2007-05-03 ENCOUNTER — Encounter: Payer: Self-pay | Admitting: Gastroenterology

## 2007-05-04 ENCOUNTER — Encounter: Payer: Self-pay | Admitting: Internal Medicine

## 2007-05-09 ENCOUNTER — Telehealth: Payer: Self-pay | Admitting: Internal Medicine

## 2007-05-23 ENCOUNTER — Ambulatory Visit: Payer: Self-pay | Admitting: Gastroenterology

## 2007-05-23 DIAGNOSIS — K2270 Barrett's esophagus without dysplasia: Secondary | ICD-10-CM | POA: Insufficient documentation

## 2007-07-02 DIAGNOSIS — Z8551 Personal history of malignant neoplasm of bladder: Secondary | ICD-10-CM | POA: Insufficient documentation

## 2007-07-07 ENCOUNTER — Ambulatory Visit: Payer: Self-pay | Admitting: Internal Medicine

## 2007-07-07 LAB — CONVERTED CEMR LAB
BUN: 22 mg/dL (ref 6–23)
Basophils Absolute: 0 10*3/uL (ref 0.0–0.1)
Basophils Relative: 0.6 % (ref 0.0–1.0)
Creatinine, Ser: 1.1 mg/dL (ref 0.4–1.5)
Eosinophils Absolute: 0.2 10*3/uL (ref 0.0–0.7)
Lymphocytes Relative: 38.2 % (ref 12.0–46.0)
Monocytes Absolute: 0.5 10*3/uL (ref 0.1–1.0)
Neutro Abs: 2.6 10*3/uL (ref 1.4–7.7)
Neutrophils Relative %: 47.4 % (ref 43.0–77.0)
RDW: 13.9 % (ref 11.5–14.6)
Sodium: 141 meq/L (ref 135–145)
TSH: 1.82 microintl units/mL (ref 0.35–5.50)

## 2007-08-03 ENCOUNTER — Encounter: Payer: Self-pay | Admitting: Internal Medicine

## 2007-08-25 ENCOUNTER — Ambulatory Visit: Payer: Self-pay | Admitting: Internal Medicine

## 2007-09-12 ENCOUNTER — Encounter: Payer: Self-pay | Admitting: Internal Medicine

## 2007-09-13 ENCOUNTER — Encounter: Payer: Self-pay | Admitting: Internal Medicine

## 2007-10-13 ENCOUNTER — Ambulatory Visit: Payer: Self-pay | Admitting: Internal Medicine

## 2007-10-13 DIAGNOSIS — J440 Chronic obstructive pulmonary disease with acute lower respiratory infection: Secondary | ICD-10-CM

## 2007-10-13 DIAGNOSIS — H04129 Dry eye syndrome of unspecified lacrimal gland: Secondary | ICD-10-CM | POA: Insufficient documentation

## 2007-10-13 DIAGNOSIS — J209 Acute bronchitis, unspecified: Secondary | ICD-10-CM | POA: Insufficient documentation

## 2007-10-18 ENCOUNTER — Telehealth: Payer: Self-pay | Admitting: Internal Medicine

## 2007-10-21 ENCOUNTER — Ambulatory Visit: Payer: Self-pay | Admitting: Oncology

## 2007-12-01 ENCOUNTER — Telehealth: Payer: Self-pay | Admitting: Internal Medicine

## 2007-12-14 ENCOUNTER — Ambulatory Visit: Payer: Self-pay | Admitting: Internal Medicine

## 2007-12-20 ENCOUNTER — Telehealth: Payer: Self-pay | Admitting: Internal Medicine

## 2008-02-08 ENCOUNTER — Ambulatory Visit: Payer: Self-pay | Admitting: Internal Medicine

## 2008-04-17 ENCOUNTER — Ambulatory Visit: Payer: Self-pay | Admitting: Internal Medicine

## 2008-04-17 LAB — CONVERTED CEMR LAB
CO2: 31 meq/L (ref 19–32)
Calcium: 9.6 mg/dL (ref 8.4–10.5)
Creatinine, Ser: 0.9 mg/dL (ref 0.4–1.5)
Eosinophils Absolute: 0.2 10*3/uL (ref 0.0–0.7)
Glucose, Bld: 117 mg/dL — ABNORMAL HIGH (ref 70–99)
HCT: 42.3 % (ref 39.0–52.0)
Hemoglobin: 14.7 g/dL (ref 13.0–17.0)
Iron: 99 ug/dL (ref 42–165)
Lymphocytes Relative: 33.9 % (ref 12.0–46.0)
MCV: 94.4 fL (ref 78.0–100.0)
Monocytes Absolute: 0.5 10*3/uL (ref 0.1–1.0)
Neutrophils Relative %: 53 % (ref 43.0–77.0)
Potassium: 4.6 meq/L (ref 3.5–5.1)
RBC: 4.49 M/uL (ref 4.22–5.81)
RDW: 12.8 % (ref 11.5–14.6)
Saturation Ratios: 29.6 % (ref 20.0–50.0)
Sodium: 144 meq/L (ref 135–145)

## 2008-06-19 ENCOUNTER — Ambulatory Visit (HOSPITAL_BASED_OUTPATIENT_CLINIC_OR_DEPARTMENT_OTHER): Admission: RE | Admit: 2008-06-19 | Discharge: 2008-06-19 | Payer: Self-pay | Admitting: Urology

## 2008-06-25 ENCOUNTER — Ambulatory Visit: Payer: Self-pay | Admitting: Internal Medicine

## 2008-06-25 DIAGNOSIS — L570 Actinic keratosis: Secondary | ICD-10-CM | POA: Insufficient documentation

## 2008-07-10 ENCOUNTER — Ambulatory Visit (HOSPITAL_BASED_OUTPATIENT_CLINIC_OR_DEPARTMENT_OTHER): Admission: RE | Admit: 2008-07-10 | Discharge: 2008-07-10 | Payer: Self-pay | Admitting: Urology

## 2008-07-10 ENCOUNTER — Encounter (INDEPENDENT_AMBULATORY_CARE_PROVIDER_SITE_OTHER): Payer: Self-pay | Admitting: Urology

## 2008-08-29 ENCOUNTER — Telehealth: Payer: Self-pay | Admitting: Internal Medicine

## 2008-09-17 ENCOUNTER — Ambulatory Visit: Payer: Self-pay | Admitting: Internal Medicine

## 2008-09-17 LAB — CONVERTED CEMR LAB
Eosinophils Relative: 4.1 % (ref 0.0–5.0)
Lymphocytes Relative: 38.6 % (ref 12.0–46.0)
MCHC: 36 g/dL (ref 30.0–36.0)
MCV: 91.3 fL (ref 78.0–100.0)
Neutrophils Relative %: 45.6 % (ref 43.0–77.0)
RDW: 13.1 % (ref 11.5–14.6)

## 2008-10-19 ENCOUNTER — Ambulatory Visit: Payer: Self-pay | Admitting: Internal Medicine

## 2008-10-19 DIAGNOSIS — N139 Obstructive and reflux uropathy, unspecified: Secondary | ICD-10-CM | POA: Insufficient documentation

## 2008-11-08 ENCOUNTER — Encounter: Payer: Self-pay | Admitting: Internal Medicine

## 2008-11-13 ENCOUNTER — Encounter: Admission: RE | Admit: 2008-11-13 | Discharge: 2008-11-13 | Payer: Self-pay | Admitting: Gastroenterology

## 2008-12-19 ENCOUNTER — Ambulatory Visit: Payer: Self-pay | Admitting: Internal Medicine

## 2008-12-24 ENCOUNTER — Telehealth: Payer: Self-pay | Admitting: Internal Medicine

## 2008-12-24 DIAGNOSIS — N63 Unspecified lump in unspecified breast: Secondary | ICD-10-CM | POA: Insufficient documentation

## 2008-12-27 ENCOUNTER — Encounter: Admission: RE | Admit: 2008-12-27 | Discharge: 2008-12-27 | Payer: Self-pay | Admitting: Internal Medicine

## 2008-12-27 LAB — HM MAMMOGRAPHY

## 2009-01-14 ENCOUNTER — Telehealth: Payer: Self-pay | Admitting: Internal Medicine

## 2009-02-19 ENCOUNTER — Ambulatory Visit: Payer: Self-pay | Admitting: Internal Medicine

## 2009-03-25 ENCOUNTER — Ambulatory Visit: Payer: Self-pay | Admitting: Internal Medicine

## 2009-04-05 ENCOUNTER — Encounter (INDEPENDENT_AMBULATORY_CARE_PROVIDER_SITE_OTHER): Payer: Self-pay | Admitting: *Deleted

## 2009-04-08 ENCOUNTER — Encounter: Payer: Self-pay | Admitting: Internal Medicine

## 2009-04-10 ENCOUNTER — Encounter: Payer: Self-pay | Admitting: Internal Medicine

## 2009-04-10 ENCOUNTER — Ambulatory Visit (HOSPITAL_COMMUNITY): Admission: RE | Admit: 2009-04-10 | Discharge: 2009-04-10 | Payer: Self-pay | Admitting: Cardiovascular Disease

## 2009-04-17 ENCOUNTER — Encounter: Payer: Self-pay | Admitting: Internal Medicine

## 2009-04-23 ENCOUNTER — Ambulatory Visit: Payer: Self-pay | Admitting: Internal Medicine

## 2009-05-24 ENCOUNTER — Encounter: Payer: Self-pay | Admitting: Internal Medicine

## 2009-05-24 ENCOUNTER — Ambulatory Visit: Payer: Self-pay | Admitting: Internal Medicine

## 2009-07-09 ENCOUNTER — Ambulatory Visit: Payer: Self-pay | Admitting: Internal Medicine

## 2009-08-05 ENCOUNTER — Ambulatory Visit: Payer: Self-pay | Admitting: Internal Medicine

## 2009-08-05 DIAGNOSIS — G5620 Lesion of ulnar nerve, unspecified upper limb: Secondary | ICD-10-CM | POA: Insufficient documentation

## 2009-09-05 ENCOUNTER — Encounter: Payer: Self-pay | Admitting: Internal Medicine

## 2009-09-17 ENCOUNTER — Ambulatory Visit: Payer: Self-pay | Admitting: Internal Medicine

## 2009-10-22 ENCOUNTER — Ambulatory Visit: Payer: Self-pay | Admitting: Internal Medicine

## 2009-10-22 ENCOUNTER — Encounter: Payer: Self-pay | Admitting: Internal Medicine

## 2009-10-23 ENCOUNTER — Telehealth: Payer: Self-pay | Admitting: Internal Medicine

## 2009-10-29 ENCOUNTER — Encounter: Payer: Self-pay | Admitting: Internal Medicine

## 2009-12-27 ENCOUNTER — Ambulatory Visit: Payer: Self-pay | Admitting: Internal Medicine

## 2009-12-27 LAB — CONVERTED CEMR LAB
Basophils Absolute: 0 10*3/uL (ref 0.0–0.1)
Chloride: 106 meq/L (ref 96–112)
Eosinophils Absolute: 0.2 10*3/uL (ref 0.0–0.7)
Folate: >20 ng/mL
GFR calc non Af Amer: 67.29 mL/min (ref >60.00)
HCT: 37.6 % — ABNORMAL LOW (ref 39.0–52.0)
Lymphs Abs: 1.9 10*3/uL (ref 0.7–4.0)
MCHC: 34.1 g/dL (ref 30.0–36.0)
Monocytes Relative: 8.5 % (ref 3.0–12.0)
Potassium: 4.8 meq/L (ref 3.5–5.1)
RDW: 13.7 % (ref 11.5–14.6)
TSH: 1.86 microintl units/mL (ref 0.35–5.50)

## 2010-02-05 ENCOUNTER — Ambulatory Visit
Admission: RE | Admit: 2010-02-05 | Discharge: 2010-02-05 | Payer: Self-pay | Source: Home / Self Care | Attending: Internal Medicine | Admitting: Internal Medicine

## 2010-02-09 ENCOUNTER — Encounter (HOSPITAL_COMMUNITY): Payer: Self-pay | Admitting: Oncology

## 2010-02-18 NOTE — Miscellaneous (Signed)
Summary: Orders Update  Clinical Lists Changes  Orders: Added new Test order of T-2 View CXR ([id number]) - Signed 

## 2010-02-18 NOTE — Assessment & Plan Note (Signed)
Summary: 2 month rov/njr   Vital Signs:  Patient profile:   70 year old male Height:      69 inches Weight:      203 pounds BMI:     30.09 Temp:     98.2 degrees F oral Pulse rate:   72 / minute Resp:     14 per minute BP sitting:   136 / 80  (left arm)  Vitals Entered By: Willy Eddy, LPN (February 19, 2009 9:28 AM) CC: roa, Hypertension Management   Primary Care Provider:  Darryll Capers  CC:  roa and Hypertension Management.  History of Present Illness: Pt had a mamogram that was negative, he had sharp pain in the left arm and felt a knot in the left breast the pain stopped with pressure on the left peck area. adequate pain control is the main issue with this pt  Hypertension History:      He denies headache, chest pain, palpitations, dyspnea with exertion, orthopnea, PND, peripheral edema, visual symptoms, neurologic problems, syncope, and side effects from treatment.        Positive major cardiovascular risk factors include male age 53 years old or older, hyperlipidemia, and hypertension.  Negative major cardiovascular risk factors include non-tobacco-user status.        Positive history for target organ damage include ASHD (either angina/prior MI/prior CABG).     Preventive Screening-Counseling & Management  Alcohol-Tobacco     Smoking Status: quit     Year Quit: 1977     Passive Smoke Exposure: no  Problems Prior to Update: 1)  Breast Mass, Left  (ICD-611.72) 2)  Urinary Obstruction Unspecified  (ICD-599.60) 3)  Other Specified Anemias  (ICD-285.8) 4)  Actinic Keratosis  (ICD-702.0) 5)  Loc Osteoarthros Not Spec Prim/sec Lower Leg  (ICD-715.36) 6)  Dry Eye Syndrome  (ICD-375.15) 7)  COPD  (ICD-496) 8)  Actinic Keratosis, Ear, Right  (ICD-702.0) 9)  Malaise and Fatigue  (ICD-780.79) 10)  Esophagitis  (ICD-530.10) 11)  Colonic Polyps  (ICD-211.3) 12)  Carcinoma, Bladder, Hx of  (ICD-V10.51) 13)  Stricture and Stenosis of Esophagus  (ICD-530.3) 14)   Barretts Esophagus  (ICD-530.85) 15)  Loc Osteoarthros Not Spec Prim/sec Lower Leg  (ICD-715.36) 16)  Uns Advrs Eff Uns Rx Medicinal&biological Sbstnc  (ICD-995.20) 17)  Other Malaise and Fatigue  (ICD-780.79) 18)  Hypothyroidism  (ICD-244.9) 19)  Family History of Cad Male 1st Degree Relative <50  (ICD-V17.3) 20)  Hypertension  (ICD-401.9) 21)  Anemia, B12 Deficiency  (ICD-281.1) 22)  Gerd, Severe  (ICD-530.81) 23)  Hyperlipidemia Nec/nos  (ICD-272.4) 24)  Low Back Pain  (ICD-724.2) 25)  Depression  (ICD-311) 26)  Allergic Rhinitis  (ICD-477.9) 27)  Neuropathy, 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3) 14)  Barretts Esophagus  (ICD-530.85) 15)  Loc Osteoarthros Not Spec Prim/sec Lower Leg  (ICD-715.36) 16)  Uns Advrs Eff Uns Rx Medicinal&biological  Sbstnc  (ICD-995.20) 17)  Other Malaise and Fatigue  (ICD-780.79) 18)  Hypothyroidism  (ICD-244.9) 19)  Family History of Cad Male 1st Degree Relative <50  (ICD-V17.3) 20)  Hypertension  (ICD-401.9) 21)  Anemia, B12 Deficiency  (ICD-281.1) 22)  Gerd, Severe  (ICD-530.81) 23)  Hyperlipidemia Nec/nos  (ICD-272.4) 24)  Low Back Pain  (ICD-724.2) 25)  Depression  (ICD-311) 26)  Allergic Rhinitis  (ICD-477.9) 27)  Neuropathy, 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) 28)  Coronary Artery Disease  (ICD-414.00)  Medications Prior to Update: 1)   Metoprolol Succinate 200 Mg  Tb24 (Metoprolol Succinate) .... Once Daily 2)  Effexor 75 Mg  Tabs (Venlafaxine Hcl) .... Take 1 Tablet By Mouth Bid 3)  Loratadine 10 Mg Tabs (Loratadine) 4)  Folic Acid 400 Mcg  Tabs (Folic Acid) .... Take 1 Tablet By Mouth Once A Day 5)  Metoclopramide Hcl 10 Mg Tabs (Metoclopramide Hcl) .... One By Mouth Qac 6)  Etodolac 300 Mg Caps (Etodolac) .[name].. 1 Once Daily 7)  Isosorbide Mononitrate Cr 60 Mg  Tb24 (Isosorbide Mononitrate) .... Two Times A Day 8)  Nexium 40 Mg Cpdr (Esomeprazole Magnesium) .... One  By Mouth Bid 9)  Vytorin 10-40 Mg  Tabs (Ezetimibe-Simvastatin) .... Qhs 10)  Adult Aspirin Ec Low Strength 81 Mg  Tbec (Aspirin) .... Once Daily 11)  Percocet 10-650 Mg  Tabs (Oxycodone-Acetaminophen) .... One By Mouth Bid 12)  Symbicort 160-4.5 Mcg/act  Aero (Budesonide-Formoterol Fumarate) .... Once Daily 13)  Nitroquick 0.4 Mg  Subl (Nitroglycerin) .... As Needed 14)  Lisinopril 20 Mg Tabs (Lisinopril) .[name].. 1 Once Daily 15)  Pataday 0.2 % Soln (Olopatadine Hcl) .... One Drop Daily 16)  Keflex 500 Mg Caps (Cephalexin) .... One By Mouth Qid X 7 Days  Current Medications (verified): 1)  Metoprolol Succinate 200 Mg  Tb24 (Metoprolol Succinate) .... Once Daily 2)  Effexor 75 Mg  Tabs (Venlafaxine Hcl) .... Take 1 Tablet By Mouth Bid 3)  Loratadine 10 Mg Tabs (Loratadine) 4)  Folic Acid 400 Mcg  Tabs (Folic Acid) .... Take 1 Tablet By Mouth Once A Day 5)  Metoclopramide Hcl 10 Mg Tabs (Metoclopramide Hcl) .... One By Mouth Qac 6)  Etodolac 300 Mg Caps (Etodolac) .[name].. 1 Once Daily 7)  Isosorbide Mononitrate Cr 60 Mg  Tb24 (Isosorbide Mononitrate) .... Two Times A Day 8)  Nexium 40 Mg Cpdr (Esomeprazole Magnesium) .... One  By Mouth Bid 9)  Vytorin 10-40 Mg  Tabs (Ezetimibe-Simvastatin) .... Qhs 10)  Adult Aspirin Ec Low Strength 81 Mg  Tbec (Aspirin) .... Once Daily 11)  Percocet 10-650 Mg  Tabs (Oxycodone-Acetaminophen) .... One By Mouth Bid 12)  Symbicort  160-4.5 Mcg/act  Aero (Budesonide-Formoterol Fumarate) .... Once Daily 13)  Nitroquick 0.4 Mg  Subl (Nitroglycerin) .... As Needed 14)  Lisinopril 20 Mg Tabs (Lisinopril) .[name].. 1 Once Daily 15)  Pataday 0.2 % Soln (Olopatadine Hcl) .... One Drop Daily  Allergies (verified): No Known Drug Allergies  Past History:  Family History: Last updated: 10/12/2006 Family History of CAD Male 1st degree relative <50 Family History Hypertension  Social History: Last updated: 02/08/2008 Married Former Smoker Drug use-no Regular exercise-no  Risk Factors: Exercise: no (02/08/2008)  Risk Factors: Smoking Status: quit (02/19/2009) Passive Smoke Exposure: no (02/19/2009)  Past medical, surgical, family and social histories (including risk factors) reviewed, and no changes noted (except as noted below).  Past Medical History: Reviewed history from 10/13/2007 and no changes required. Current Problems:  ESOPHAGITIS (ICD-530.10) COLONIC POLYPS (ICD-211.3) CARCINOMA,  BLADDER, HX OF (ICD-V10.51) STRICTURE AND STENOSIS OF ESOPHAGUS (ICD-530.3) BARRETTS ESOPHAGUS (ICD-530.85) LOC OSTEOARTHROS NOT SPEC PRIM/SEC LOWER LEG (ICD-715.36) UNS ADVRS EFF UNS RX MEDICINAL&BIOLOGICAL SBSTNC (ICD-995.20) OTHER MALAISE AND FATIGUE (ICD-780.79) HYPOTHYROIDISM (ICD-244.9) FAMILY HISTORY OF CAD MALE 1ST DEGREE RELATIVE <50 (ICD-V17.3) HYPERTENSION (ICD-401.9) ANEMIA, B12 DEFICIENCY (ICD-281.1) GERD, SEVERE (ICD-530.81) HYPERLIPIDEMIA NEC/NOS (ICD-272.4) LOW BACK PAIN (ICD-724.2) DEPRESSION (ICD-311) ALLERGIC RHINITIS (ICD-477.9) NEUROPATHY, IDIOPATHIC PERIPHERAL NEC (ICD-356.8) CORONARY ARTERY DISEASE (ICD-414.00) COPD  Past Surgical History: Reviewed history from 12/14/2007 and no changes required. CABG X4 (2002) ACDF Lumbar laminectomy & fusion Bladder; secondary to CA arthroscopy and bakers cyst  Family History: Reviewed history from 10/12/2006 and no changes required. Family History of CAD  Male 1st degree relative <50 Family History Hypertension  Social History: Reviewed history from 02/08/2008 and no changes required. Married Former Smoker Drug use-no Regular exercise-no  Review of Systems  The patient denies anorexia, fever, weight loss, weight gain, vision loss, decreased hearing, hoarseness, chest pain, syncope, dyspnea on exertion, peripheral edema, prolonged cough, headaches, hemoptysis, abdominal pain, melena, hematochezia, severe indigestion/heartburn, hematuria, incontinence, genital sores, muscle weakness, suspicious skin lesions, transient blindness, difficulty walking, depression, unusual weight change, abnormal bleeding, enlarged lymph nodes, angioedema, breast masses, and testicular masses.    Physical Exam  General:  Well-developed,well-nourished,in no acute distress; alert,appropriate and cooperative throughout examination Head:  Normocephalic and atraumatic without obvious abnormalities. No apparent alopecia or balding. Eyes:  pupils equal and pupils round.   Ears:  R ear normal and L ear normal.   Mouth:  pharynx pink and moist.   Neck:  No deformities, masses, or tenderness noted. Lungs:  normal respiratory effort and no wheezes.    increased I to E Heart:  normal rate and regular rhythm.  2/6 SM Abdomen:  Bowel sounds positive,abdomen soft and non-tender without masses, organomegaly or hernias noted. Msk:  decreased ROM, joint tenderness, and joint swelling.  lumbar lordosis and SI joint tenderness.   Extremities:  No clubbing, cyanosis, edema, or deformity noted with normal full range of motion of all joints.   Neurologic:  alert & oriented X3 and DTRs symmetrical and normal.     Impression & Recommendations:  Problem # 1:  BREAST MASS, LEFT (ICD-611.72)  mamagram was negative  Mammogram was ordered  Problem # 2:  BARRETTS ESOPHAGUS (ICD-530.85) Assessment: Unchanged stable with the medicaton sample of nexium have helped the nexium is th  ebest for the control  Problem # 3:  COPD (ICD-496) Assessment: Unchanged using the symbicort intermitantly His updated medication list for this problem includes:    Symbicort 160-4.5 Mcg/act Aero (Budesonide-formoterol fumarate) ..... Once daily  Vaccines Reviewed: Flu Vax: Historical (10/19/2008)  Problem # 4:  HYPERTENSION (ICD-401.9) Assessment: Unchanged stable His updated medication list for this problem includes:    Metoprolol Succinate 200 Mg Tb24 (Metoprolol succinate) ..... Once daily    Lisinopril 20 Mg Tabs (Lisinopril) .[name]... 1 once daily  BP today: 136/80 Prior BP: 140/80 (12/19/2008)  Prior 10 Yr Risk Heart Disease: N/A (10/12/2006)  Labs Reviewed: K+: 4.6 (04/17/2008) Creat: : 0.9 (04/17/2008)   Chol: 165 (04/17/2008)   HDL: 32.20 (04/17/2008)   LDL: DEL (04/08/2007)   TG: 245 (04/08/2007)  Problem # 5:  CORONARY ARTERY DISEASE (ICD-414.00) Assessment: Unchanged  atypical pain in the mid back, the pain is not exertional ( no predictive factors) His updated medication list for this problem includes:    Metoprolol Succinate 200 Mg Tb24 (Metoprolol succinate) ..... Once daily    Isosorbide  Mononitrate Cr 60 Mg Tb24 (Isosorbide mononitrate) .[name]..[name] Two times a day    Adult Aspirin Ec Low Strength 81 Mg Tbec (Aspirin) ..... Once daily    Nitroquick 0.4 Mg Subl (Nitroglycerin) .[name]... As needed    Lisinopril 20 Mg Tabs (Lisinopril) .[name]... 1 once daily  Labs Reviewed: Chol: 165 (04/17/2008)   HDL: 32.20 (04/17/2008)   LDL: DEL (04/08/2007)   TG: 245 (04/08/2007)  Lipid Goals: Chol Goal: 200 (10/12/2006)   HDL Goal: 40 (10/12/2006)   LDL Goal: 100 (10/12/2006)   TG Goal: 150 (10/12/2006)  Problem # 6:  LOW BACK PAIN (ICD-724.2) Assessment: Unchanged trial of longer acting pain meds pain is the main limiting factor in activity His updated medication list for this problem includes:    Etodolac 300 Mg Caps (Etodolac) .[name]... 1 once daily    Adult Aspirin Ec Low Strength  81 Mg Tbec (Aspirin) ..... Once daily    Ms Contin 30 Mg Xr12h-tab (Morphine sulfate) ..... One by mouth bid  Discussed use of moist heat or ice, modified activities, medications, and stretching/strengthening exercises. Back care instructions given. To be seen in 2 weeks if no improvement; sooner if worsening of symptoms.   Complete Medication List: 1)  Metoprolol Succinate 200 Mg Tb24 (Metoprolol succinate) .... Once daily 2)  Effexor 75 Mg Tabs (Venlafaxine hcl) .... Take 1 tablet by mouth bid 3)  Loratadine 10 Mg Tabs (Loratadine) 4)  Folic Acid 400 Mcg Tabs (Folic acid) .... Take 1 tablet by mouth once a day 5)  Metoclopramide Hcl 10 Mg Tabs (Metoclopramide hcl) .... One by mouth qac 6)  Etodolac 300 Mg Caps (Etodolac) .[name].. 1 once daily 7)  Isosorbide Mononitrate Cr 60 Mg Tb24 (Isosorbide mononitrate) .... Two times a day 8)  Nexium 40 Mg Cpdr (Esomeprazole magnesium) .... One  by mouth bid 9)  Vytorin 10-40 Mg Tabs (Ezetimibe-simvastatin) .... Qhs 10)  Adult Aspirin Ec Low Strength 81 Mg Tbec (Aspirin) .... Once daily 11)  Ms Contin 30 Mg Xr12h-tab (Morphine sulfate) .... One by mouth bid 12)  Symbicort 160-4.5 Mcg/act Aero (Budesonide-formoterol fumarate) .... Once daily 13)  Nitroquick 0.4 Mg Subl (Nitroglycerin) .... As needed 14)  Lisinopril 20 Mg Tabs (Lisinopril) .[name].. 1 once daily 15)  Pataday 0.2 % Soln (Olopatadine hcl) .... One drop daily  Hypertension Assessment/Plan:      The patient's hypertensive risk group is category C: Target organ damage and/or diabetes.  Today's blood pressure is 136/80.  His blood pressure goal is < 140/90. th  Patient Instructions: 1)  Please schedule a follow-up appointment in 1 month. Prescriptions: MS CONTIN 30 MG XR12H-TAB (MORPHINE SULFATE) one by mouth BID  #60 x 0   Entered and Authorized by:   Stacie Glaze MD   Signed by:   Stacie Glaze MD on 02/19/2009   Method used:   Print then Give to Patient   RxID:    [id number]   Appended Document: Orders Update     Clinical Lists Changes  Orders: Added new Service order of Est. Patient Level IV (70623) - Signed

## 2010-02-18 NOTE — Assessment & Plan Note (Signed)
Summary: b12//ccm   Nurse Visit   Allergies: No Known Drug Allergies  Medication Administration  Injection # 1:    Medication: Vit B12 1000 mcg    Diagnosis: ANEMIA, B12 DEFICIENCY (ICD-281.1)    Route: IM    Site: L deltoid    Exp Date: 03/19/2011    Lot #: [id number]    Mfr: [employer]    Patient tolerated injection without complications    Given by: Willy Eddy, LPN (September 17, 2009 12:16 PM)  Orders Added: 1)  Vit B12 1000 mcg [J3420] 2)  Admin of Therapeutic Inj  intramuscular or subcutaneous [21308]

## 2010-02-18 NOTE — Assessment & Plan Note (Signed)
Summary: 2 month rov/njr   Vital Signs:  Patient profile:   70 year old male Height:      69 inches Weight:      200 pounds BMI:     29.64 Temp:     98.2 degrees F oral Pulse rate:   68 / minute Resp:     14 per minute BP sitting:   120 / 70  (left arm)  Vitals Entered By: Willy Eddy, LPN (October 22, 2009 9:02 AM) CC: roa Is Patient Diabetic? No   Primary Care Provider:  Darryll Capers  CC:  roa.  History of Present Illness: Feels occasoanlly SOB with exertion at rest not SOB with gradual walking  no increased SOB this has ben persisant cardiologist has stated not heart use of the inhailer seems to "make it worse" no chest pains   Preventive Screening-Counseling & Management  Alcohol-Tobacco     Smoking Status: quit     Year Quit: 1977     Passive Smoke Exposure: no     Tobacco Counseling: not indicated; no tobacco use  Comments: no signifcant smoking hx  Problems Prior to Update: 1)  Dyspnea On Exertion  (ICD-786.09) 2)  Ulnar Neuropathy, Left  (ICD-354.2) 3)  Breast Mass, Left  (ICD-611.72) 4)  Urinary Obstruction Unspecified  (ICD-599.60) 5)  Other Specified Anemias  (ICD-285.8) 6)  Actinic Keratosis  (ICD-702.0) 7)  Loc Osteoarthros Not Spec Prim/sec Lower Leg  (ICD-715.36) 8)  Dry Eye Syndrome  (ICD-375.15) 9)  COPD  (ICD-496) 10)  Actinic Keratosis, Ear, Right  (ICD-702.0) 11)  Malaise and Fatigue  (ICD-780.79) 12)  Esophagitis  (ICD-530.10) 13)  Colonic Polyps  (ICD-211.3) 14)  Carcinoma, Bladder, Hx of  (ICD-V10.51) 15)  Stricture and Stenosis of Esophagus  (ICD-530.3) 16)  Barretts Esophagus  (ICD-530.85) 17)  Loc Osteoarthros Not Spec Prim/sec Lower Leg  (ICD-715.36) 18)  Uns Advrs Eff Uns Rx Medicinal&biological Sbstnc  (ICD-995.20) 19)  Other Malaise and Fatigue  (ICD-780.79) 20)  Hypothyroidism  (ICD-244.9) 21)  Family History of Cad Male 1st Degree Relative <50  (ICD-V17.3) 22)  Hypertension  (ICD-401.9) 23)  Anemia, B12  Deficiency  (ICD-281.1) 24)  Gerd, Severe  (ICD-530.81) 25)  Hyperlipidemia Nec/nos  (ICD-272.4) 26)  Low Back Pain  (ICD-724.2) 27)  Depression  (ICD-311) 28)  Allergic Rhinitis  (ICD-477.9) 29)  Neuropathy, 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) 30)  Coronary Artery Disease  (ICD-414.00)  Current Problems (verified): 1)  Ulnar Neuropathy, Left  (ICD-354.2) 2)  Breast Mass, Left  (ICD-611.72) 3)  Urinary Obstruction Unspecified  (ICD-599.60) 4)  Other Specified Anemias  (ICD-285.8) 5)  Actinic Keratosis  (ICD-702.0) 6)  Loc Osteoarthros Not Spec Prim/sec Lower Leg  (ICD-715.36) 7)  Dry Eye Syndrome  (ICD-375.15) 8)  COPD  (ICD-496) 9)  Actinic Keratosis, Ear, Right  (ICD-702.0) 10)  Malaise and Fatigue  (ICD-780.79) 11)  Esophagitis  (ICD-530.10) 12)  Colonic Polyps  (ICD-211.3) 13)  Carcinoma, Bladder, Hx of  (ICD-V10.51) 14)  Stricture and Stenosis of Esophagus  (ICD-530.3) 15)  Barretts Esophagus  (ICD-530.85) 16)  Loc Osteoarthros Not Spec Prim/sec Lower Leg  (ICD-715.36) 17)  Uns Advrs Eff Uns Rx Medicinal&biological Sbstnc  (ICD-995.20) 18)  Other Malaise and Fatigue  (ICD-780.79) 19)  Hypothyroidism  (ICD-244.9) 20)  Family History of Cad Male 1st Degree Relative <50  (ICD-V17.3) 21)  Hypertension  (ICD-401.9) 22)  Anemia, B12 Deficiency  (ICD-281.1) 23)  Gerd, Severe  (ICD-530.81) 24)  Hyperlipidemia Nec/nos  (ICD-272.4)  25)  Low Back Pain  (ICD-724.2) 26)  Depression  (ICD-311) 27)  Allergic Rhinitis  (ICD-477.9) 28)  Neuropathy, 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 1 Once Daily 2)  Venlafaxine Hcl 75 Mg Tabs (Venlafaxine Hcl) .[name].. 1 Two Times A Day 3)  Loratadine 10 Mg Tabs (Loratadine) .[name].. 1 Once Daily 4)  Folic Acid 400 Mcg  Tabs (Folic Acid) .... Take 1 Tablet By Mouth Once A Day 5)  Metoclopramide Hcl 10 Mg Tabs (Metoclopramide Hcl) ....  One By Mouth Qac 6)  Etodolac 300 Mg Caps (Etodolac) .[name].. 1 Once Daily 7)  Isosorbide Mononitrate Cr 60 Mg  Tb24 (Isosorbide Mononitrate) .... Two Times A Day 8)  Nexium 40 Mg Cpdr (Esomeprazole Magnesium) .... One  By Mouth Bid 9)  Vytorin 10-40 Mg  Tabs (Ezetimibe-Simvastatin) .... Qhs 10)  Adult Aspirin Ec Low Strength 81 Mg  Tbec (Aspirin) .... Once Daily 11)  Oxycodone-Acetaminophen 10-325 Mg Tabs (Oxycodone-Acetaminophen) .... One By Mouth Two Times A Day As Directed 12)  Symbicort 160-4.5 Mcg/act  Aero (Budesonide-Formoterol Fumarate) .... Two Puff By Mouth Two Times A Day Rinse Mouth With Water After Use 13)  Nitroquick 0.4 Mg  Subl (Nitroglycerin) .... As Needed 14)  Lisinopril 20 Mg Tabs (Lisinopril) .[name].. 1 Once Daily 15)  Pataday 0.2 % Soln (Olopatadine Hcl) .... One Drop Daily 16)  Prilosec 40 Mg Cpdr (Omeprazole) .[name].. 1 Once Daily 17)  Niaspan 500 Mg Cr-Tabs (Niacin (Antihyperlipidemic)) .[name].. 1 Once Daily 18)  Ropinirole Hcl 1 Mg Tabs (Ropinirole Hcl) .[name].. 1 Once Daily 19)  Fexofenadine Hcl 180 Mg Tabs (Fexofenadine Hcl) .[name].. 1 Once Daily As Needed  Current Medications (verified): 1)  Bystolic 10 Mg Tabs (Nebivolol Hcl) .[name].. 1 Once Daily 2)  Venlafaxine Hcl 75 Mg Tabs (Venlafaxine Hcl) .[name].. 1 Two Times A Day 3)  Loratadine 10 Mg Tabs (Loratadine) .[name].. 1 Once Daily 4)  Folic Acid 400 Mcg  Tabs (Folic Acid) .... Take 1 Tablet By Mouth Once A Day 5)  Metoclopramide Hcl 10 Mg Tabs (Metoclopramide Hcl) .... One By Mouth Qac 6)  Etodolac 300 Mg Caps (Etodolac) .[name].. 1 Once Daily 7)  Isosorbide Mononitrate Cr 60 Mg  Tb24 (Isosorbide Mononitrate) .... Two Times A Day 8)  Nexium 40 Mg Cpdr (Esomeprazole Magnesium) .... One  By Mouth Bid 9)  Vytorin 10-40 Mg  Tabs (Ezetimibe-Simvastatin) .... Qhs 10)  Adult Aspirin Ec Low Strength 81 Mg  Tbec (Aspirin) .... Once Daily 11)  Oxycodone-Acetaminophen 10-325 Mg Tabs (Oxycodone-Acetaminophen) .... One By Mouth Two Times A Day As Directed 12)   Nitroquick 0.4 Mg  Subl (Nitroglycerin) .... As Needed 13)  Lisinopril 20 Mg Tabs (Lisinopril) .[name].. 1 Once Daily 14)  Pataday 0.2 % Soln (Olopatadine Hcl) .... One Drop Daily 15)  Prilosec 40 Mg Cpdr (Omeprazole) .[name].. 1 Once Daily 16)  Ropinirole Hcl 1 Mg Tabs (Ropinirole Hcl) .[name].. 1 Once Daily 17)  Fexofenadine Hcl 180 Mg Tabs (Fexofenadine Hcl) .[name].. 1 Once Daily As Needed  Allergies (verified): No Known Drug Allergies  Past History:  Family History: Last updated: 10/12/2006 Family History of CAD Male 1st degree relative <50 Family History Hypertension  Social History: Last updated: 02/08/2008 Married Former Smoker Drug use-no Regular exercise-no  Risk Factors: Exercise: no (02/08/2008)  Risk Factors: Smoking Status: quit (10/22/2009) Passive Smoke Exposure: no (10/22/2009)  Past medical, surgical, family and social  histories (including risk factors) reviewed, and no changes noted (except as noted below).  Past Medical History: Reviewed history from 10/13/2007 and no changes required. Current Problems:  ESOPHAGITIS (ICD-530.10) COLONIC POLYPS (ICD-211.3) CARCINOMA, BLADDER, HX OF (ICD-V10.51) STRICTURE AND STENOSIS OF ESOPHAGUS (ICD-530.3) BARRETTS ESOPHAGUS (ICD-530.85) LOC OSTEOARTHROS NOT SPEC PRIM/SEC LOWER LEG (ICD-715.36) UNS ADVRS EFF UNS RX MEDICINAL&BIOLOGICAL SBSTNC (ICD-995.20) OTHER MALAISE AND FATIGUE (ICD-780.79) HYPOTHYROIDISM (ICD-244.9) FAMILY HISTORY OF CAD MALE 1ST DEGREE RELATIVE <50 (ICD-V17.3) HYPERTENSION (ICD-401.9) ANEMIA, B12 DEFICIENCY (ICD-281.1) GERD, SEVERE (ICD-530.81) HYPERLIPIDEMIA NEC/NOS (ICD-272.4) LOW BACK PAIN (ICD-724.2) DEPRESSION (ICD-311) ALLERGIC RHINITIS (ICD-477.9) NEUROPATHY, IDIOPATHIC PERIPHERAL NEC (ICD-356.8) CORONARY ARTERY DISEASE (ICD-414.00) COPD  Past Surgical History: Reviewed history from 12/14/2007 and no changes required. CABG X4 (2002) ACDF Lumbar laminectomy & fusion Bladder; secondary to  CA arthroscopy and bakers cyst  Family History: Reviewed history from 10/12/2006 and no changes required. Family History of CAD Male 1st degree relative <50 Family History Hypertension  Social History: Reviewed history from 02/08/2008 and no changes required. Married Former Smoker Drug use-no Regular exercise-no  Review of Systems       The patient complains of hoarseness and dyspnea on exertion.  The patient denies anorexia, fever, weight loss, weight gain, vision loss, decreased hearing, chest pain, syncope, peripheral edema, prolonged cough, headaches, hemoptysis, abdominal pain, melena, hematochezia, severe indigestion/heartburn, hematuria, incontinence, genital sores, muscle weakness, suspicious skin lesions, transient blindness, difficulty walking, depression, unusual weight change, abnormal bleeding, enlarged lymph nodes, angioedema, breast masses, and testicular masses.         Flu Vaccine Consent Questions     Do you have a history of severe allergic reactions to this vaccine? no    Any prior history of allergic reactions to egg and/or gelatin? no    Do you have a sensitivity to the preservative Thimersol? no    Do you have a past history of Guillan-Barre Syndrome? no    Do you currently have an acute febrile illness? no    Have you ever had a severe reaction to latex? no    Vaccine information given and explained to patient? yes    Are you currently pregnant? no    Lot Number:[id number]   Exp Date:07/19/2010   Site Given  Left Deltoid IM   Physical Exam  General:  Well-developed,well-nourished,in no acute distress; alert,appropriate and cooperative throughout examination Head:  Normocephalic and atraumatic without obvious abnormalities. No apparent alopecia or balding. Eyes:  pupils equal and pupils round.   Ears:  R ear normal and L ear normal.   Nose:  no external deformity and no nasal discharge.   Neck:  No deformities, masses, or tenderness noted. Lungs:  normal  respiratory effort and no wheezes.    increased I to E Heart:  normal rate and regular rhythm.  2/6 SM Abdomen:  Bowel sounds positive,abdomen soft and non-tender without masses, organomegaly or hernias noted. Msk:  joint tenderness and joint swelling.   Pulses:  R and L carotid,radial,femoral,dorsalis pedis and posterior tibial pulses are full and equal bilaterally Extremities:  No clubbing, cyanosis, edema, or deformity noted with normal full range of motion of all joints.     Impression & Recommendations:  Problem # 1:  DYSPNEA ON EXERTION (ICD-786.09) he is on symbicort but feels there is not any benifir will order PFT's today  mild COPD his SOB may be a BB effect The following medications were removed from the medication list:    Symbicort 160-4.5 Mcg/act Aero (Budesonide-formoterol fumarate) .[name]..[name] Two puff  by mouth two times a day rinse mouth with water after use His updated medication list for this problem includes:    Bystolic 10 Mg Tabs (Nebivolol hcl) .[name]... 1 once daily    Lisinopril 20 Mg Tabs (Lisinopril) .[name]... 1 once daily  Recommended fluid and salt restriction.   Problem # 2:  HYPOTHYROIDISM (ICD-244.9)  Labs Reviewed: TSH: 2.07 (09/17/2008)    Chol: 165 (04/17/2008)   HDL: 32.20 (04/17/2008)   LDL: DEL (04/08/2007)   TG: 245 (04/08/2007)  Problem # 3:  HYPERTENSION (ICD-401.9) Assessment: Unchanged  His updated medication list for this problem includes:    Bystolic 10 Mg Tabs (Nebivolol hcl) .[name]... 1 once daily    Lisinopril 20 Mg Tabs (Lisinopril) .[name]... 1 once daily  BP today: 120/70 Prior BP: 120/72 (08/05/2009)  Prior 10 Yr Risk Heart Disease: N/A (10/12/2006)  Labs Reviewed: K+: 4.6 (04/17/2008) Creat: : 0.9 (04/17/2008)   Chol: 165 (04/17/2008)   HDL: 32.20 (04/17/2008)   LDL: DEL (04/08/2007)   TG: 245 (04/08/2007)  Problem # 4:  STRICTURE AND STENOSIS OF ESOPHAGUS (ICD-530.3) iincreased dyspahgia with swallowing hx of stricture and barretts consider  egd with palnned colon  Problem # 5:  COPD (ICD-496) hold symbicort for now The following medications were removed from the medication list:    Symbicort 160-4.5 Mcg/act Aero (Budesonide-formoterol fumarate) .[name]..[name] Two puff by mouth two times a day rinse mouth with water after use  Orders: Spirometry w/Graph (94010)  Vaccines Reviewed: Pneumovax: Pneumovax (Medicare) (10/22/2009)   Flu Vax: Fluvax 3+ (10/22/2009)  Complete Medication List: 1)  Bystolic 10 Mg Tabs (Nebivolol hcl) .[name].. 1 once daily 2)  Venlafaxine Hcl 75 Mg Tabs (Venlafaxine hcl) .[name].. 1 two times a day 3)  Loratadine 10 Mg Tabs (Loratadine) .[name].. 1 once daily 4)  Folic Acid 400 Mcg Tabs (Folic acid) .... Take 1 tablet by mouth once a day 5)  Metoclopramide Hcl 10 Mg Tabs (Metoclopramide hcl) .... One by mouth qac 6)  Etodolac 300 Mg Caps (Etodolac) .[name].. 1 once daily 7)  Isosorbide Mononitrate Cr 60 Mg Tb24 (Isosorbide mononitrate) .... Two times a day 8)  Nexium 40 Mg Cpdr (Esomeprazole magnesium) .... One  by mouth bid 9)  Vytorin 10-40 Mg Tabs (Ezetimibe-simvastatin) .... Qhs 10)  Adult Aspirin Ec Low Strength 81 Mg Tbec (Aspirin) .... Once daily 11)  Oxycodone-acetaminophen 10-325 Mg Tabs (Oxycodone-acetaminophen) .... One by mouth two times a day as directed 12)  Nitroquick 0.4 Mg Subl (Nitroglycerin) .... As needed 13)  Lisinopril 20 Mg Tabs (Lisinopril) .[name].. 1 once daily 14)  Pataday 0.2 % Soln (Olopatadine hcl) .... One drop daily 15)  Prilosec 40 Mg Cpdr (Omeprazole) .[name].. 1 once daily 16)  Ropinirole Hcl 1 Mg Tabs (Ropinirole hcl) .[name].. 1 once daily 17)  Fexofenadine Hcl 180 Mg Tabs (Fexofenadine hcl) .[name].. 1 once daily as needed  Other Orders: Flu Vaccine 93yrs + MEDICARE PATIENTS (E4540) Administration Flu vaccine - MCR (G0008) Vit B12 1000 mcg (J8119) Admin of Therapeutic Inj  intramuscular or subcutaneous (14782) Pneumococcal Vaccine (95621) Admin 1st Vaccine (30865) Orthopedic Surgeon Referral (Ortho  Surgeon)  Patient Instructions: 1)  Please schedule a follow-up appointment in 2 months. Prescriptions: OXYCODONE-ACETAMINOPHEN 10-325 MG TABS (OXYCODONE-ACETAMINOPHEN) one by mouth two times a day as directed  #60 x 0   Entered by:   Willy Eddy, LPN   Authorized by:   Stacie Glaze MD   Signed by:   Willy Eddy, LPN on 78/46/9629  Method used:   Print then Give to Patient   RxID:   [id number] OXYCODONE-ACETAMINOPHEN 10-325 MG TABS (OXYCODONE-ACETAMINOPHEN) one by mouth two times a day as directed  #60 x 0   Entered by:   Willy Eddy, LPN   Authorized by:   Stacie Glaze MD   Signed by:   Willy Eddy, LPN on 37/10/6267   Method used:   Print then Give to Patient   RxID:   [id number]    Immunizations Administered:  Pneumonia Vaccine:    Vaccine Type: Pneumovax (Medicare)    Site: right deltoid    Mfr: Merck    Dose: 0.5 ml    Route: IM    Given by: Willy Eddy, LPN    Exp. Date: 02/09/2011    Lot #: [id number]    Medication Administration  Injection # 1:    Medication: Vit B12 1000 mcg    Diagnosis: ANEMIA, B12 DEFICIENCY (ICD-281.1)    Route: IM    Site: R deltoid    Exp Date: 08/19/2011    Lot #: [id number]    Mfr: [employer] Regent    Patient tolerated injection without complications    Given by: Willy Eddy, LPN (October 22, 2009 9:06 AM)  Orders Added: 1)  Flu Vaccine 30yrs + MEDICARE PATIENTS [Q2039] 2)  Administration Flu vaccine - MCR [G0008] 3)  Vit B12 1000 mcg [J3420] 4)  Admin of Therapeutic Inj  intramuscular or subcutaneous [96372] 5)  Pneumococcal Vaccine [90732] 6)  Admin 1st Vaccine [90471] 7)  Est. Patient Level IV [16967] 8)  Orthopedic Surgeon Referral [Ortho Surgeon] 9)  Spirometry w/Graph [89381]

## 2010-02-18 NOTE — Letter (Signed)
Summary: Endoscopy Letter  [facility]  [address] [place], [place] [address]   Phone: [phone]  Fax: [phone]      April 05, 2009 MRN: [id number]   [name] [address] [place], [place]  [address]   Dear Mr. Dredge,   According to your medical record, it is time for you to schedule an Endoscopy. Endoscopic screening is recommended for patients with certain upper digestive tract conditions because of associated increased risk for cancers of the upper digestive system.  This letter has been generated based on the recommendations made at the time of your prior procedure. If you feel that in your particular situation this may no longer apply, please contact our office.  Please call our office at [phone]) to schedule this appointment or to update your records at your earliest convenience.  Thank you for cooperating with [place] to provide you with the very best care possible.   Sincerely,  Barbette Hair. Arlyce Dice, M.D.  [facility] [phone]

## 2010-02-18 NOTE — Assessment & Plan Note (Signed)
Summary: B12 INJ/NJR   Nurse Visit   Allergies: No Known Drug Allergies  Medication Administration  Injection # 1:    Medication: Vit B12 1000 mcg    Diagnosis: ANEMIA, B12 DEFICIENCY (ICD-281.1)    Route: IM    Site: R deltoid    Exp Date: 10/18/2010    Lot #: [id number]    Mfr: [employer] Regent    Patient tolerated injection without complications    Given by: Willy Eddy, LPN (April 23, 4096 11:59 AM)  Orders Added: 1)  Vit B12 1000 mcg [J3420] 2)  Admin of Therapeutic Inj  intramuscular or subcutaneous [11914]

## 2010-02-18 NOTE — Assessment & Plan Note (Signed)
Summary: 1 MONTH ROV/NJR   Vital Signs:  Patient profile:   70 year old male Height:      69 inches Weight:      200 pounds BMI:     29.64 Temp:     98.2 degrees F oral Pulse rate:   72 / minute Resp:     14 per minute BP sitting:   150 / 80  (left arm)  Vitals Entered By: Willy Eddy, LPN (March 26, 7827 10:12 AM) CC: roa- couldnt toler ate morphine- went back on percocet , Hypertension Management   Primary Care Provider:  Darryll Capers  CC:  roa- couldnt toler ate morphine- went back on percocet  and Hypertension Management.  History of Present Illness: trial of MS did not work with the pt resumming the use of the percocet we discussed the use of percocet and the possibility of [place] of methadone long  discussion of the percocet use back pain is stable   Hypertension History:      He denies headache, chest pain, palpitations, dyspnea with exertion, orthopnea, PND, peripheral edema, visual symptoms, neurologic problems, syncope, and side effects from treatment.        Positive major cardiovascular risk factors include male age 45 years old or older, hyperlipidemia, and hypertension.  Negative major cardiovascular risk factors include non-tobacco-user status.        Positive history for target organ damage include ASHD (either angina/prior MI/prior CABG).     Preventive Screening-Counseling & Management  Alcohol-Tobacco     Smoking Status: quit     Year Quit: 1977     Passive Smoke Exposure: no  Current Problems (verified): 1)  Breast Mass, Left  (ICD-611.72) 2)  Urinary Obstruction Unspecified  (ICD-599.60) 3)  Other Specified Anemias  (ICD-285.8) 4)  Actinic Keratosis  (ICD-702.0) 5)  Loc Osteoarthros Not Spec Prim/sec Lower Leg  (ICD-715.36) 6)  Dry Eye Syndrome  (ICD-375.15) 7)  COPD  (ICD-496) 8)  Actinic Keratosis, Ear, Right  (ICD-702.0) 9)  Malaise and Fatigue  (ICD-780.79) 10)  Esophagitis  (ICD-530.10) 11)  Colonic Polyps  (ICD-211.3) 12)  Carcinoma,  Bladder, Hx of  (ICD-V10.51) 13)  Stricture and Stenosis of Esophagus  (ICD-530.3) 14)  Barretts Esophagus  (ICD-530.85) 15)  Loc Osteoarthros Not Spec Prim/sec Lower Leg  (ICD-715.36) 16)  Uns Advrs Eff Uns Rx Medicinal&biological Sbstnc  (ICD-995.20) 17)  Other Malaise and Fatigue  (ICD-780.79) 18)  Hypothyroidism  (ICD-244.9) 19)  Family History of Cad Male 1st Degree Relative <50  (ICD-V17.3) 20)  Hypertension  (ICD-401.9) 21)  Anemia, B12 Deficiency  (ICD-281.1) 22)  Gerd, Severe  (ICD-530.81) 23)  Hyperlipidemia Nec/nos  (ICD-272.4) 24)  Low Back Pain  (ICD-724.2) 25)  Depression  (ICD-311) 26)  Allergic Rhinitis  (ICD-477.9) 27)  Neuropathy, 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) 28)  Coronary Artery Disease  (ICD-414.00)  Current Medications (verified): 1)  Metoprolol Succinate 200 Mg  Tb24 (Metoprolol Succinate) .... Once Daily 2)  Effexor 75 Mg  Tabs (Venlafaxine Hcl) .... Take 1 Tablet By Mouth Bid 3)  Loratadine 10 Mg Tabs (Loratadine) 4)  Folic Acid 400 Mcg  Tabs (Folic Acid) .... Take 1 Tablet By Mouth Once A Day 5)  Metoclopramide Hcl 10 Mg Tabs (Metoclopramide Hcl) .... One By Mouth Qac 6)  Etodolac 300 Mg Caps (Etodolac) .[name].. 1 Once Daily 7)  Isosorbide Mononitrate Cr 60 Mg  Tb24 (Isosorbide Mononitrate) .... Two Times A Day 8)  Nexium 40 Mg Cpdr (Esomeprazole Magnesium) .Marland KitchenMarland KitchenMarland Kitchen  One  By Mouth Bid 9)  Vytorin 10-40 Mg  Tabs (Ezetimibe-Simvastatin) .... Qhs 10)  Adult Aspirin Ec Low Strength 81 Mg  Tbec (Aspirin) .... Once Daily 11)  Percocet 10-650 Mg Tabs (Oxycodone-Acetaminophen) .[name].. 1 Two Times A Day 12)  Symbicort 160-4.5 Mcg/act  Aero (Budesonide-Formoterol Fumarate) .... Once Daily 13)  Nitroquick 0.4 Mg  Subl (Nitroglycerin) .... As Needed 14)  Lisinopril 20 Mg Tabs (Lisinopril) .[name].. 1 Once Daily 15)  Pataday 0.2 % Soln (Olopatadine Hcl) .... One Drop Daily  Allergies (verified): No Known Drug Allergies  Past History:  Family History: Last updated:  10/12/2006 Family History of CAD Male 1st degree relative <50 Family History Hypertension  Social History: Last updated: 02/08/2008 Married Former Smoker Drug use-no Regular exercise-no  Risk Factors: Exercise: no (02/08/2008)  Risk Factors: Smoking Status: quit (03/25/2009) Passive Smoke Exposure: no (03/25/2009)  Past medical, surgical, family and social histories (including risk factors) reviewed, and no changes noted (except as noted below).  Past Medical History: Reviewed history from 10/13/2007 and no changes required. Current Problems:  ESOPHAGITIS (ICD-530.10) COLONIC POLYPS (ICD-211.3) CARCINOMA, BLADDER, HX OF (ICD-V10.51) STRICTURE AND STENOSIS OF ESOPHAGUS (ICD-530.3) BARRETTS ESOPHAGUS (ICD-530.85) LOC OSTEOARTHROS NOT SPEC PRIM/SEC LOWER LEG (ICD-715.36) UNS ADVRS EFF UNS RX MEDICINAL&BIOLOGICAL SBSTNC (ICD-995.20) OTHER MALAISE AND FATIGUE (ICD-780.79) HYPOTHYROIDISM (ICD-244.9) FAMILY HISTORY OF CAD MALE 1ST DEGREE RELATIVE <50 (ICD-V17.3) HYPERTENSION (ICD-401.9) ANEMIA, B12 DEFICIENCY (ICD-281.1) GERD, SEVERE (ICD-530.81) HYPERLIPIDEMIA NEC/NOS (ICD-272.4) LOW BACK PAIN (ICD-724.2) DEPRESSION (ICD-311) ALLERGIC RHINITIS (ICD-477.9) NEUROPATHY, IDIOPATHIC PERIPHERAL NEC (ICD-356.8) CORONARY ARTERY DISEASE (ICD-414.00) COPD  Past Surgical History: Reviewed history from 12/14/2007 and no changes required. CABG X4 (2002) ACDF Lumbar laminectomy & fusion Bladder; secondary to CA arthroscopy and bakers cyst  Family History: Reviewed history from 10/12/2006 and no changes required. Family History of CAD Male 1st degree relative <50 Family History Hypertension  Social History: Reviewed history from 02/08/2008 and no changes required. Married Former Smoker Drug use-no Regular exercise-no  Review of Systems  The patient denies anorexia, fever, weight loss, weight gain, vision loss, decreased hearing, hoarseness, chest pain, syncope, dyspnea on  exertion, peripheral edema, prolonged cough, headaches, hemoptysis, abdominal pain, melena, hematochezia, severe indigestion/heartburn, hematuria, incontinence, genital sores, muscle weakness, suspicious skin lesions, transient blindness, difficulty walking, depression, unusual weight change, abnormal bleeding, enlarged lymph nodes, angioedema, and breast masses.    Physical Exam  General:  Well-developed,well-nourished,in no acute distress; alert,appropriate and cooperative throughout examination Head:  Normocephalic and atraumatic without obvious abnormalities. No apparent alopecia or balding. Eyes:  pupils equal and pupils round.   Ears:  R ear normal and L ear normal.   Nose:  no external deformity and no nasal discharge.   Mouth:  pharynx pink and moist.   Neck:  No deformities, masses, or tenderness noted. Lungs:  normal respiratory effort and no wheezes.    increased I to E Heart:  normal rate and regular rhythm.  2/6 SM Abdomen:  Bowel sounds positive,abdomen soft and non-tender without masses, organomegaly or hernias noted.   Impression & Recommendations:  Problem # 1:  HYPERTENSION (ICD-401.9)  His updated medication list for this problem includes:    Metoprolol Succinate 200 Mg Tb24 (Metoprolol succinate) ..... Once daily    Lisinopril 20 Mg Tabs (Lisinopril) .[name]... 1 once daily  BP today: 150/80 Prior BP: 136/80 (02/19/2009)  Prior 10 Yr Risk Heart Disease: N/A (10/12/2006)  Labs Reviewed: K+: 4.6 (04/17/2008) Creat: : 0.9 (04/17/2008)   Chol: 165 (04/17/2008)   HDL: 32.20 (04/17/2008)  LDL: DEL (04/08/2007)   TG: 245 (04/08/2007)  Problem # 2:  LOW BACK PAIN (ICD-724.2) resume the percocet His updated medication list for this problem includes:    Etodolac 300 Mg Caps (Etodolac) .[name]... 1 once daily    Adult Aspirin Ec Low Strength 81 Mg Tbec (Aspirin) ..... Once daily    Oxycodone-acetaminophen 10-325 Mg Tabs (Oxycodone-acetaminophen) ..... One by mouth two times a  day as directed  Discussed use of moist heat or ice, modified activities, medications, and stretching/strengthening exercises. Back care instructions given. To be seen in 2 weeks if no improvement; sooner if worsening of symptoms.   Problem # 3:  NEUROPATHY, IDIOPATHIC PERIPHERAL NEC (ICD-356.8) b12 shots  Problem # 4:  ANEMIA, B12 DEFICIENCY (ICD-281.1) resume these month His updated medication list for this problem includes:    Folic Acid 400 Mcg Tabs (Folic acid) .[name]... Take 1 tablet by mouth once a day  Hgb: 13.2 (09/17/2008)   Hct: 36.6 (09/17/2008)   Platelets: 168.0 (09/17/2008) RBC: 4.01 (09/17/2008)   RDW: 13.1 (09/17/2008)   WBC: 9.0 (09/17/2008) MCV: 91.3 (09/17/2008)   MCHC: 36.0 (09/17/2008) Iron: 99 (04/17/2008)   % Sat: 29.6 (04/17/2008) B12: >1500 pg/mL (04/17/2008)   Folate: 16.0 (04/17/2008)   TSH: 2.07 (09/17/2008)  Complete Medication List: 1)  Metoprolol Succinate 200 Mg Tb24 (Metoprolol succinate) .... Once daily 2)  Effexor 75 Mg Tabs (Venlafaxine hcl) .... Take 1 tablet by mouth bid 3)  Loratadine 10 Mg Tabs (Loratadine) 4)  Folic Acid 400 Mcg Tabs (Folic acid) .... Take 1 tablet by mouth once a day 5)  Metoclopramide Hcl 10 Mg Tabs (Metoclopramide hcl) .... One by mouth qac 6)  Etodolac 300 Mg Caps (Etodolac) .[name].. 1 once daily 7)  Isosorbide Mononitrate Cr 60 Mg Tb24 (Isosorbide mononitrate) .... Two times a day 8)  Nexium 40 Mg Cpdr (Esomeprazole magnesium) .... One  by mouth bid 9)  Vytorin 10-40 Mg Tabs (Ezetimibe-simvastatin) .... Qhs 10)  Adult Aspirin Ec Low Strength 81 Mg Tbec (Aspirin) .... Once daily 11)  Oxycodone-acetaminophen 10-325 Mg Tabs (Oxycodone-acetaminophen) .... One by mouth two times a day as directed 12)  Symbicort 160-4.5 Mcg/act Aero (Budesonide-formoterol fumarate) .... Once daily 13)  Nitroquick 0.4 Mg Subl (Nitroglycerin) .... As needed 14)  Lisinopril 20 Mg Tabs (Lisinopril) .[name].. 1 once daily 15)  Pataday 0.2 % Soln (Olopatadine  hcl) .... One drop daily  Hypertension Assessment/Plan:      The patient's hypertensive risk group is category C: Target organ damage and/or diabetes.  Today's blood pressure is 150/80.  His blood pressure goal is < 140/90.  Patient Instructions: 1)  B12 shots 2)  every  month at noon come back   one month on a tuesday 3)  Please schedule a follow-up appointment in 3 months. Prescriptions: OXYCODONE-ACETAMINOPHEN 10-325 MG TABS (OXYCODONE-ACETAMINOPHEN) one by mouth two times a day as directed  #60 x 0   Entered and Authorized by:   Stacie Glaze MD   Signed by:   Stacie Glaze MD on 03/25/2009   Method used:   Print then Give to Patient   RxID:   [id number] OXYCODONE-ACETAMINOPHEN 10-325 MG TABS (OXYCODONE-ACETAMINOPHEN) one by mouth two times a day as directed  #60 x 0   Entered and Authorized by:   Stacie Glaze MD   Signed by:   Stacie Glaze MD on 03/25/2009   Method used:   Print then Give to Patient   RxID:   [id number] OXYCODONE-ACETAMINOPHEN 10-325  MG TABS (OXYCODONE-ACETAMINOPHEN) one by mouth two times a day as directed  #60 x 0   Entered and Authorized by:   Stacie Glaze MD   Signed by:   Stacie Glaze MD on 03/25/2009   Method used:   Print then Give to Patient   RxID:   [id number]

## 2010-02-18 NOTE — Assessment & Plan Note (Signed)
Summary: 2 month rov/njr   Vital Signs:  Patient profile:   70 year old male Height:      69 inches Weight:      192 pounds BMI:     28.46 Temp:     98.2 degrees F oral Pulse rate:   72 / minute Resp:     14 per minute BP sitting:   124 / 80  (left arm)  Vitals Entered By: Willy Eddy, LPN (December 27, 2009 8:34 AM) CC: roa, Hypertension Management Is Patient Diabetic? No   Primary Care Provider:  Darryll Capers  CC:  roa and Hypertension Management.  History of Present Illness: Has lost weight and has been doing well Has surgery for nerve intrapment at right elbow bu Dr Mina Marble Has good control of HTN and control of back pain  Hypertension History:      He denies headache, chest pain, palpitations, dyspnea with exertion, orthopnea, PND, peripheral edema, visual symptoms, neurologic problems, syncope, and side effects from treatment.        Positive major cardiovascular risk factors include male age 71 years old or older, hyperlipidemia, and hypertension.  Negative major cardiovascular risk factors include non-tobacco-user status.        Positive history for target organ damage include ASHD (either angina/prior MI/prior CABG).     Preventive Screening-Counseling & Management  Alcohol-Tobacco     Smoking Status: quit     Year Quit: 1977     Passive Smoke Exposure: no     Tobacco Counseling: not indicated; no tobacco use  Problems Prior to Update: 1)  Dyspnea On Exertion  (ICD-786.09) 2)  Ulnar Neuropathy, Left  (ICD-354.2) 3)  Breast Mass, Left  (ICD-611.72) 4)  Urinary Obstruction Unspecified  (ICD-599.60) 5)  Other Specified Anemias  (ICD-285.8) 6)  Actinic Keratosis  (ICD-702.0) 7)  Loc Osteoarthros Not Spec Prim/sec Lower Leg  (ICD-715.36) 8)  Dry Eye Syndrome  (ICD-375.15) 9)  COPD  (ICD-496) 10)  Actinic Keratosis, Ear, Right  (ICD-702.0) 11)  Malaise and Fatigue  (ICD-780.79) 12)  Esophagitis  (ICD-530.10) 13)  Colonic Polyps  (ICD-211.3) 14)   Carcinoma, Bladder, Hx of  (ICD-V10.51) 15)  Stricture and Stenosis of Esophagus  (ICD-530.3) 16)  Barretts Esophagus  (ICD-530.85) 17)  Loc Osteoarthros Not Spec Prim/sec Lower Leg  (ICD-715.36) 18)  Uns Advrs Eff Uns Rx Medicinal&biological Sbstnc  (ICD-995.20) 19)  Other Malaise and Fatigue  (ICD-780.79) 20)  Hypothyroidism  (ICD-244.9) 21)  Family History of Cad Male 1st Degree Relative <50  (ICD-V17.3) 22)  Hypertension  (ICD-401.9) 23)  Anemia, B12 Deficiency  (ICD-281.1) 24)  Gerd, Severe  (ICD-530.81) 25)  Hyperlipidemia Nec/nos  (ICD-272.4) 26)  Low Back Pain  (ICD-724.2) 27)  Depression  (ICD-311) 28)  Allergic Rhinitis  (ICD-477.9) 29)  Neuropathy, 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) 30)  Coronary Artery Disease  (ICD-414.00)  Current Problems (verified): 1)  Dyspnea On Exertion  (ICD-786.09) 2)  Ulnar Neuropathy, Left  (ICD-354.2) 3)  Breast Mass, Left  (ICD-611.72) 4)  Urinary Obstruction Unspecified  (ICD-599.60) 5)  Other Specified Anemias  (ICD-285.8) 6)  Actinic Keratosis  (ICD-702.0) 7)  Loc Osteoarthros Not Spec Prim/sec Lower Leg  (ICD-715.36) 8)  Dry Eye Syndrome  (ICD-375.15) 9)  COPD  (ICD-496) 10)  Actinic Keratosis, Ear, Right  (ICD-702.0) 11)  Malaise and Fatigue  (ICD-780.79) 12)  Esophagitis  (ICD-530.10) 13)  Colonic Polyps  (ICD-211.3) 14)  Carcinoma, Bladder, Hx of  (ICD-V10.51) 15)  Stricture and Stenosis  of Esophagus  (ICD-530.3) 16)  Barretts Esophagus  (ICD-530.85) 17)  Loc Osteoarthros Not Spec Prim/sec Lower Leg  (ICD-715.36) 18)  Uns Advrs Eff Uns Rx Medicinal&biological Sbstnc  (ICD-995.20) 19)  Other Malaise and Fatigue  (ICD-780.79) 20)  Hypothyroidism  (ICD-244.9) 21)  Family History of Cad Male 1st Degree Relative <50  (ICD-V17.3) 22)  Hypertension  (ICD-401.9) 23)  Anemia, B12 Deficiency  (ICD-281.1) 24)  Gerd, Severe  (ICD-530.81) 25)  Hyperlipidemia Nec/nos  (ICD-272.4) 26)  Low Back Pain  (ICD-724.2) 27)  Depression   (ICD-311) 28)  Allergic Rhinitis  (ICD-477.9) 29)  Neuropathy, 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) 30)  Coronary Artery Disease  (ICD-414.00)  Medications Prior to Update: 1)  Bystolic 10 Mg Tabs (Nebivolol Hcl) .[name].. 1 Once Daily 2)  Venlafaxine Hcl 75 Mg Tabs (Venlafaxine Hcl) .[name].. 1 Two Times A Day 3)  Loratadine 10 Mg Tabs (Loratadine) .[name].. 1 Once Daily 4)  Folic Acid 400 Mcg  Tabs (Folic Acid) .... Take 1 Tablet By Mouth Once A Day 5)  Metoclopramide Hcl 10 Mg Tabs (Metoclopramide Hcl) .... One By Mouth Qac 6)  Etodolac 300 Mg Caps (Etodolac) .[name].. 1 Once Daily 7)  Isosorbide Mononitrate Cr 60 Mg  Tb24 (Isosorbide Mononitrate) .... Two Times A Day 8)  Nexium 40 Mg Cpdr (Esomeprazole Magnesium) .... One  By Mouth Bid 9)  Vytorin 10-40 Mg  Tabs (Ezetimibe-Simvastatin) .... Qhs 10)  Adult Aspirin Ec Low Strength 81 Mg  Tbec (Aspirin) .... Once Daily 11)  Oxycodone-Acetaminophen 10-325 Mg Tabs (Oxycodone-Acetaminophen) .... One By Mouth Two Times A Day As Directed 12)  Nitroquick 0.4 Mg  Subl (Nitroglycerin) .... As Needed 13)  Lisinopril 20 Mg Tabs (Lisinopril) .[name].. 1 Once Daily 14)  Pataday 0.2 % Soln (Olopatadine Hcl) .... One Drop Daily 15)  Prilosec 40 Mg Cpdr (Omeprazole) .[name].. 1 Once Daily 16)  Ropinirole Hcl 2 Mg Tabs (Ropinirole Hcl) .[name].. 1 Once Daily 17)  Fexofenadine Hcl 180 Mg Tabs (Fexofenadine Hcl) .[name].. 1 Once Daily As Needed  Current Medications (verified): 1)  Bystolic 10 Mg Tabs (Nebivolol Hcl) .[name].. 1 Once Daily 2)  Venlafaxine Hcl 75 Mg Tabs (Venlafaxine Hcl) .[name].. 1 Two Times A Day 3)  Loratadine 10 Mg Tabs (Loratadine) .[name].. 1 Once Daily 4)  Folic Acid 400 Mcg  Tabs (Folic Acid) .... Take 1 Tablet By Mouth Once A Day 5)  Metoclopramide Hcl 10 Mg Tabs (Metoclopramide Hcl) .... One By Mouth Qac 6)  Etodolac 300 Mg Caps (Etodolac) .[name].. 1 Once Daily 7)  Isosorbide Mononitrate Cr 60 Mg  Tb24 (Isosorbide Mononitrate) .... Two Times A Day 8)  Nexium 40 Mg Cpdr  (Esomeprazole Magnesium) .... One  By Mouth Bid 9)  Vytorin 10-40 Mg  Tabs (Ezetimibe-Simvastatin) .... Qhs 10)  Adult Aspirin Ec Low Strength 81 Mg  Tbec (Aspirin) .... Once Daily 11)  Oxycodone-Acetaminophen 10-325 Mg Tabs (Oxycodone-Acetaminophen) .... One By Mouth Two Times A Day As Directed 12)  Nitroquick 0.4 Mg  Subl (Nitroglycerin) .... As Needed 13)  Lisinopril 20 Mg Tabs (Lisinopril) .[name].. 1 Once Daily 14)  Pataday 0.2 % Soln (Olopatadine Hcl) .... One Drop Daily 15)  Prilosec 40 Mg Cpdr (Omeprazole) .[name].. 1 Once Daily 16)  Ropinirole Hcl 2 Mg Tabs (Ropinirole Hcl) .[name].. 1 Once Daily 17)  Fexofenadine Hcl 180 Mg Tabs (Fexofenadine Hcl) .[name].. 1 Once Daily As Needed  Allergies (verified): No Known Drug Allergies  Past History:  Family History: Last updated: 10/12/2006 Family History of CAD  Male 1st degree relative <50 Family History Hypertension  Social History: Last updated: 02/08/2008 Married Former Smoker Drug use-no Regular exercise-no  Risk Factors: Exercise: no (02/08/2008)  Risk Factors: Smoking Status: quit (12/27/2009) Passive Smoke Exposure: no (12/27/2009)  Past medical, surgical, family and social histories (including risk factors) reviewed, and no changes noted (except as noted below).  Past Medical History: Reviewed history from 10/13/2007 and no changes required. Current Problems:  ESOPHAGITIS (ICD-530.10) COLONIC POLYPS (ICD-211.3) CARCINOMA, BLADDER, HX OF (ICD-V10.51) STRICTURE AND STENOSIS OF ESOPHAGUS (ICD-530.3) BARRETTS ESOPHAGUS (ICD-530.85) LOC OSTEOARTHROS NOT SPEC PRIM/SEC LOWER LEG (ICD-715.36) UNS ADVRS EFF UNS RX MEDICINAL&BIOLOGICAL SBSTNC (ICD-995.20) OTHER MALAISE AND FATIGUE (ICD-780.79) HYPOTHYROIDISM (ICD-244.9) FAMILY HISTORY OF CAD MALE 1ST DEGREE RELATIVE <50 (ICD-V17.3) HYPERTENSION (ICD-401.9) ANEMIA, B12 DEFICIENCY (ICD-281.1) GERD, SEVERE (ICD-530.81) HYPERLIPIDEMIA NEC/NOS (ICD-272.4) LOW BACK PAIN  (ICD-724.2) DEPRESSION (ICD-311) ALLERGIC RHINITIS (ICD-477.9) NEUROPATHY, IDIOPATHIC PERIPHERAL NEC (ICD-356.8) CORONARY ARTERY DISEASE (ICD-414.00) COPD  Past Surgical History: Reviewed history from 12/14/2007 and no changes required. CABG X4 (2002) ACDF Lumbar laminectomy & fusion Bladder; secondary to CA arthroscopy and bakers cyst  Family History: Reviewed history from 10/12/2006 and no changes required. Family History of CAD Male 1st degree relative <50 Family History Hypertension  Social History: Reviewed history from 02/08/2008 and no changes required. Married Former Smoker Drug use-no Regular exercise-no  Review of Systems  The patient denies anorexia, fever, weight loss, weight gain, vision loss, decreased hearing, hoarseness, chest pain, syncope, dyspnea on exertion, peripheral edema, prolonged cough, headaches, hemoptysis, abdominal pain, melena, hematochezia, severe indigestion/heartburn, hematuria, incontinence, genital sores, muscle weakness, suspicious skin lesions, transient blindness, difficulty walking, depression, unusual weight change, abnormal bleeding, enlarged lymph nodes, angioedema, and breast masses.    Physical Exam  General:  Well-developed,well-nourished,in no acute distress; alert,appropriate and cooperative throughout examination Head:  Normocephalic and atraumatic without obvious abnormalities. No apparent alopecia or balding. Eyes:  pupils equal and pupils round.   Ears:  R ear normal and L ear normal.   Nose:  no external deformity and no nasal discharge.   Mouth:  pharynx pink and moist.   Neck:  No deformities, masses, or tenderness noted. Lungs:  normal respiratory effort and no wheezes.    increased I to E Abdomen:  Bowel sounds positive,abdomen soft and non-tender without masses, organomegaly or hernias noted. Msk:  joint tenderness and joint swelling.   Neurologic:  No cranial nerve deficits noted. Station and gait are normal.  Plantar reflexes are down-going bilaterally. DTRs are symmetrical throughout. Sensory, motor and coordinative functions appear intact.   Impression & Recommendations:  Problem # 1:  HYPOTHYROIDISM (ICD-244.9)  Labs Reviewed: TSH: 2.07 (09/17/2008)    Chol: 165 (04/17/2008)   HDL: 32.20 (04/17/2008)   LDL: DEL (04/08/2007)   TG: 245 (04/08/2007)  Orders: TLB-TSH (Thyroid Stimulating Hormone) (84443-TSH) Venipuncture (16109)  Problem # 2:  HYPERTENSION (ICD-401.9)  His updated medication list for this problem includes:    Bystolic 10 Mg Tabs (Nebivolol hcl) .[name]... 1 once daily    Lisinopril 20 Mg Tabs (Lisinopril) .[name]... 1 once daily  BP today: 124/80 Prior BP: 120/70 (10/22/2009)  Prior 10 Yr Risk Heart Disease: N/A (10/12/2006)  Labs Reviewed: K+: 4.6 (04/17/2008) Creat: : 0.9 (04/17/2008)   Chol: 165 (04/17/2008)   HDL: 32.20 (04/17/2008)   LDL: DEL (04/08/2007)   TG: 245 (04/08/2007)  Orders: TLB-BMP (Basic Metabolic Panel-BMET) (80048-METABOL)  Problem # 3:  COPD (ICD-496) Assessment: Unchanged  Vaccines Reviewed: Pneumovax: Pneumovax (Medicare) (10/22/2009)   Flu Vax: Fluvax 3+ (10/22/2009)  Problem # 4:  CORONARY ARTERY DISEASE (ICD-414.00) Assessment: Unchanged  His updated medication list for this problem includes:    Bystolic 10 Mg Tabs (Nebivolol hcl) .[name]... 1 once daily    Isosorbide Mononitrate Cr 60 Mg Tb24 (Isosorbide mononitrate) .[name]..[name] Two times a day    Adult Aspirin Ec Low Strength 81 Mg Tbec (Aspirin) ..... Once daily    Nitroquick 0.4 Mg Subl (Nitroglycerin) .[name]... As needed    Lisinopril 20 Mg Tabs (Lisinopril) .[name]... 1 once daily  Labs Reviewed: Chol: 165 (04/17/2008)   HDL: 32.20 (04/17/2008)   LDL: DEL (04/08/2007)   TG: 245 (04/08/2007)  Lipid Goals: Chol Goal: 200 (10/12/2006)   HDL Goal: 40 (10/12/2006)   LDL Goal: 100 (10/12/2006)   TG Goal: 150 (10/12/2006)  Problem # 5:  ACTINIC KERATOSIS, EAR, RIGHT (ICD-702.0)  treat with cryo THREE  LESION  the lesion was identifies as a   ak'S ON RIGHT PINNA       and 40 seconds of cryotherapy with the liguid nitrogen gun was apllied to the site. The pt tolerated the procedure and post procedure care was discussed  Orders: Cryotherapy/Destruction benign or premalignant lesion (1st lesion)  (17000) Cryotherapy/Destruction benign or premalignant lesion (2nd-14th lesions) (17003)  Complete Medication List: 1)  Bystolic 10 Mg Tabs (Nebivolol hcl) .[name].. 1 once daily 2)  Venlafaxine Hcl 75 Mg Tabs (Venlafaxine hcl) .[name].. 1 two times a day 3)  Loratadine 10 Mg Tabs (Loratadine) .[name].. 1 once daily 4)  Folic Acid 400 Mcg Tabs (Folic acid) .... Take 1 tablet by mouth once a day 5)  Metoclopramide Hcl 10 Mg Tabs (Metoclopramide hcl) .... One by mouth qac 6)  Etodolac 300 Mg Caps (Etodolac) .[name].. 1 once daily 7)  Isosorbide Mononitrate Cr 60 Mg Tb24 (Isosorbide mononitrate) .... Two times a day 8)  Nexium 40 Mg Cpdr (Esomeprazole magnesium) .... One  by mouth bid 9)  Vytorin 10-40 Mg Tabs (Ezetimibe-simvastatin) .... Qhs 10)  Adult Aspirin Ec Low Strength 81 Mg Tbec (Aspirin) .... Once daily 11)  Oxycodone-acetaminophen 10-325 Mg Tabs (Oxycodone-acetaminophen) .... One by mouth two times a day as directed 12)  Nitroquick 0.4 Mg Subl (Nitroglycerin) .... As needed 13)  Lisinopril 20 Mg Tabs (Lisinopril) .[name].. 1 once daily 14)  Pataday 0.2 % Soln (Olopatadine hcl) .... One drop daily 15)  Prilosec 40 Mg Cpdr (Omeprazole) .[name].. 1 once daily 16)  Ropinirole Hcl 2 Mg Tabs (Ropinirole hcl) .[name].. 1 once daily 17)  Fexofenadine Hcl 180 Mg Tabs (Fexofenadine hcl) .[name].. 1 once daily as needed  Other Orders: Vit B12 1000 mcg (J3420) Admin of Therapeutic Inj  intramuscular or subcutaneous (16109) TLB-B12 + Folate Pnl (60454_09811-B14/NWG) TLB-CBC Platelet - w/Differential (85025-CBCD)  Hypertension Assessment/Plan:      The patient's hypertensive risk group is category C: Target organ damage and/or diabetes.   Today's blood pressure is 124/80.  His blood pressure goal is < 140/90.  Patient Instructions: 1)  Please schedule a follow-up appointment in 3 months. 2)  Tagamet or cimetadine OYC take twice a day Prescriptions: OXYCODONE-ACETAMINOPHEN 10-325 MG TABS (OXYCODONE-ACETAMINOPHEN) one by mouth two times a day as directed  #60 x 0   Entered by:   Willy Eddy, LPN   Authorized by:   Stacie Glaze MD   Signed by:   Willy Eddy, LPN on 95/62/1308   Method used:   Print then Give to Patient   RxID:   [id number] OXYCODONE-ACETAMINOPHEN 10-325 MG TABS (OXYCODONE-ACETAMINOPHEN) one by  mouth two times a day as directed  #60 x 0   Entered by:   Willy Eddy, LPN   Authorized by:   Stacie Glaze MD   Signed by:   Willy Eddy, LPN on 29/56/2130   Method used:   Print then Give to Patient   RxID:   [id number] OXYCODONE-ACETAMINOPHEN 10-325 MG TABS (OXYCODONE-ACETAMINOPHEN) one by mouth two times a day as directed  #60 x 0   Entered by:   Willy Eddy, LPN   Authorized by:   Stacie Glaze MD   Signed by:   Willy Eddy, LPN on 32/44/0102   Method used:   Print then Give to Patient   RxID:   [id number]    Medication Administration  Injection # 1:    Medication: Vit B12 1000 mcg    Diagnosis: ANEMIA, B12 DEFICIENCY (ICD-281.1)    Route: IM    Site: L deltoid    Exp Date: 08/19/2011    Lot #: [id number]    Mfr: [employer]    Patient tolerated injection without complications    Given by: Willy Eddy, LPN (December 27, 2009 8:35 AM)  Orders Added: 1)  Vit B12 1000 mcg [J3420] 2)  Admin of Therapeutic Inj  intramuscular or subcutaneous [96372] 3)  Est. Patient Level IV [56387] 4)  TLB-BMP (Basic Metabolic Panel-BMET) [80048-METABOL] 5)  TLB-B12 + Folate Pnl [[id number]-B12/FOL] 6)  TLB-CBC Platelet - w/Differential [85025-CBCD] 7)  TLB-TSH (Thyroid Stimulating Hormone) [84443-TSH] 8)  Venipuncture [56433] 9)   Cryotherapy/Destruction benign or premalignant lesion (1st lesion)  [17000] 10)  Cryotherapy/Destruction benign or premalignant lesion (2nd-14th lesions) [17003]

## 2010-02-18 NOTE — Assessment & Plan Note (Signed)
Summary: 2 month rovn/njr   Vital Signs:  Patient profile:   70 year old male Height:      69 inches Weight:      201 pounds BMI:     29.79 Temp:     97.9 degrees F oral Pulse rate:   68 / minute Resp:     14 per minute BP sitting:   120 / 72  (left arm)  Vitals Entered By: Willy Eddy, LPN (August 05, 2009 9:46 AM) CC: roa, Hypertension Management Is Patient Diabetic? No   Primary Care Provider:  Darryll Capers  CC:  roa and Hypertension Management.  History of Present Illness: monitering of chronic pain medicatons has CAD and HTN has been stable on current pain medication acute compliant of elbow pain increased with rotation of wrist likely epicodylitis no acute tramua   Hypertension History:      He denies headache, chest pain, palpitations, dyspnea with exertion, orthopnea, PND, peripheral edema, visual symptoms, neurologic problems, syncope, and side effects from treatment.        Positive major cardiovascular risk factors include male age 62 years old or older, hyperlipidemia, and hypertension.  Negative major cardiovascular risk factors include non-tobacco-user status.        Positive history for target organ damage include ASHD (either angina/prior MI/prior CABG).     Preventive Screening-Counseling & Management  Alcohol-Tobacco     Smoking Status: quit     Year Quit: 1977     Passive Smoke Exposure: no  Problems Prior to Update: 1)  Breast Mass, Left  (ICD-611.72) 2)  Urinary Obstruction Unspecified  (ICD-599.60) 3)  Other Specified Anemias  (ICD-285.8) 4)  Actinic Keratosis  (ICD-702.0) 5)  Loc Osteoarthros Not Spec Prim/sec Lower Leg  (ICD-715.36) 6)  Dry Eye Syndrome  (ICD-375.15) 7)  COPD  (ICD-496) 8)  Actinic Keratosis, Ear, Right  (ICD-702.0) 9)  Malaise and Fatigue  (ICD-780.79) 10)  Esophagitis  (ICD-530.10) 11)  Colonic Polyps  (ICD-211.3) 12)  Carcinoma, Bladder, Hx of  (ICD-V10.51) 13)  Stricture and Stenosis of Esophagus   (ICD-530.3) 14)  Barretts Esophagus  (ICD-530.85) 15)  Loc Osteoarthros Not Spec Prim/sec Lower Leg  (ICD-715.36) 16)  Uns Advrs Eff Uns Rx Medicinal&biological Sbstnc  (ICD-995.20) 17)  Other Malaise and Fatigue  (ICD-780.79) 18)  Hypothyroidism  (ICD-244.9) 19)  Family History of Cad Male 1st Degree Relative <50  (ICD-V17.3) 20)  Hypertension  (ICD-401.9) 21)  Anemia, B12 Deficiency  (ICD-281.1) 22)  Gerd, Severe  (ICD-530.81) 23)  Hyperlipidemia Nec/nos  (ICD-272.4) 24)  Low Back Pain  (ICD-724.2) 25)  Depression  (ICD-311) 26)  Allergic Rhinitis  (ICD-477.9) 27)  Neuropathy, 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3) 14)  Barretts Esophagus  (ICD-530.85) 15)  Loc Osteoarthros Not Spec Prim/sec Lower Leg  (ICD-715.36) 16)  Uns Advrs Eff Uns Rx Medicinal&biological Sbstnc  (ICD-995.20) 17)  Other Malaise and  Fatigue  (ICD-780.79) 18)  Hypothyroidism  (ICD-244.9) 19)  Family History of Cad Male 1st Degree Relative <50  (ICD-V17.3) 20)  Hypertension  (ICD-401.9) 21)  Anemia, B12 Deficiency  (ICD-281.1) 22)  Gerd, Severe  (ICD-530.81) 23)  Hyperlipidemia Nec/nos  (ICD-272.4) 24)  Low Back Pain  (ICD-724.2) 25)  Depression  (ICD-311) 26)  Allergic Rhinitis  (ICD-477.9) 27)  Neuropathy, 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 1 Once Daily 2)  Effexor 75 Mg  Tabs (Venlafaxine Hcl) .... Take 1 Tablet By Mouth Bid 3)  Loratadine 10 Mg Tabs (Loratadine) .[name].. 1 Once Daily 4)  Folic Acid 400 Mcg  Tabs (Folic Acid) .... Take 1 Tablet By Mouth Once A Day 5)  Metoclopramide Hcl 10 Mg Tabs (Metoclopramide Hcl) .... One By Mouth Qac 6)  Etodolac 300 Mg Caps (Etodolac) .[name].. 1 Once Daily 7)  Isosorbide Mononitrate Cr 60 Mg  Tb24 (Isosorbide Mononitrate) .... Two Times A Day 8)  Nexium 40 Mg Cpdr (Esomeprazole Magnesium) .... One  By Mouth Bid 9)  Vytorin 10-40 Mg  Tabs (Ezetimibe-Simvastatin) .... Qhs 10)  Adult Aspirin Ec Low Strength 81 Mg  Tbec (Aspirin) .... Once Daily 11)  Oxycodone-Acetaminophen 10-325 Mg Tabs (Oxycodone-Acetaminophen) .... One By Mouth Two Times A Day As Directed 12)  Symbicort 160-4.5 Mcg/act  Aero (Budesonide-Formoterol Fumarate) .... Two Puff By Mouth Two Times A Day Rinse Mouth With Water After Use 13)  Nitroquick 0.4 Mg  Subl (Nitroglycerin) .... As Needed 14)  Lisinopril 20 Mg Tabs (Lisinopril) .[name].. 1 Once Daily 15)  Pataday 0.2 % Soln (Olopatadine Hcl) .... One Drop Daily 16)  Prilosec 40 Mg Cpdr (Omeprazole) .[name].. 1 Once Daily 17)  Niaspan 500 Mg Cr-Tabs (Niacin (Antihyperlipidemic)) .[name].. 1 Once Daily 18)  Ropinirole Hcl 1 Mg Tabs (Ropinirole Hcl) .[name].. 1 Once Daily 19)  Fexofenadine Hcl 180 Mg Tabs (Fexofenadine Hcl) .[name].. 1 Once Daily As Needed  Current Medications (verified): 1)  Bystolic 10 Mg Tabs (Nebivolol Hcl) .[name].. 1 Once Daily 2)  Effexor 75 Mg  Tabs (Venlafaxine Hcl) .... Take 1 Tablet By Mouth Bid 3)  Loratadine 10 Mg Tabs (Loratadine) .[name].. 1 Once Daily 4)  Folic Acid 400 Mcg  Tabs (Folic Acid) .... Take 1 Tablet By Mouth Once A Day 5)  Metoclopramide Hcl 10 Mg Tabs (Metoclopramide Hcl) .... One By Mouth Qac 6)  Etodolac 300 Mg Caps (Etodolac) .[name].. 1 Once Daily 7)  Isosorbide Mononitrate Cr 60 Mg  Tb24 (Isosorbide Mononitrate)  .... Two Times A Day 8)  Nexium 40 Mg Cpdr (Esomeprazole Magnesium) .... One  By Mouth Bid 9)  Vytorin 10-40 Mg  Tabs (Ezetimibe-Simvastatin) .... Qhs 10)  Adult Aspirin Ec Low Strength 81 Mg  Tbec (Aspirin) .... Once Daily 11)  Oxycodone-Acetaminophen 10-325 Mg Tabs (Oxycodone-Acetaminophen) .... One By Mouth Two Times A Day As Directed 12)  Symbicort 160-4.5 Mcg/act  Aero (Budesonide-Formoterol Fumarate) .... Two Puff By Mouth Two Times A Day Rinse Mouth With Water After Use 13)  Nitroquick 0.4 Mg  Subl (Nitroglycerin) .... As Needed 14)  Lisinopril 20 Mg Tabs (Lisinopril) .[name].. 1 Once Daily 15)  Pataday 0.2 % Soln (Olopatadine Hcl) .... One Drop Daily 16)  Prilosec 40 Mg Cpdr (Omeprazole) .[name].. 1 Once Daily 17)  Niaspan 500 Mg Cr-Tabs (Niacin (Antihyperlipidemic)) .[name].. 1 Once Daily 18)  Ropinirole Hcl 1 Mg Tabs (Ropinirole Hcl) .Marland KitchenMarland KitchenMarland Kitchen 1  Once Daily 19)  Fexofenadine Hcl 180 Mg Tabs (Fexofenadine Hcl) .[name].. 1 Once Daily As Needed  Allergies (verified): No Known Drug Allergies  Past History:  Family History: Last updated: 10/12/2006 Family History of CAD Male 1st degree relative <50 Family History Hypertension  Social History: Last updated: 02/08/2008 Married Former Smoker Drug use-no Regular exercise-no  Risk Factors: Exercise: no (02/08/2008)  Risk Factors: Smoking Status: quit (08/05/2009) Passive Smoke Exposure: no (08/05/2009)  Past medical, surgical, family and social histories (including risk factors) reviewed, and no changes noted (except as noted below).  Past Medical History: Reviewed history from 10/13/2007 and no changes required. Current Problems:  ESOPHAGITIS (ICD-530.10) COLONIC POLYPS (ICD-211.3) CARCINOMA, BLADDER, HX OF (ICD-V10.51) STRICTURE AND STENOSIS OF ESOPHAGUS (ICD-530.3) BARRETTS ESOPHAGUS (ICD-530.85) LOC OSTEOARTHROS NOT SPEC PRIM/SEC LOWER LEG (ICD-715.36) UNS ADVRS EFF UNS RX MEDICINAL&BIOLOGICAL SBSTNC (ICD-995.20) OTHER MALAISE AND FATIGUE  (ICD-780.79) HYPOTHYROIDISM (ICD-244.9) FAMILY HISTORY OF CAD MALE 1ST DEGREE RELATIVE <50 (ICD-V17.3) HYPERTENSION (ICD-401.9) ANEMIA, B12 DEFICIENCY (ICD-281.1) GERD, SEVERE (ICD-530.81) HYPERLIPIDEMIA NEC/NOS (ICD-272.4) LOW BACK PAIN (ICD-724.2) DEPRESSION (ICD-311) ALLERGIC RHINITIS (ICD-477.9) NEUROPATHY, IDIOPATHIC PERIPHERAL NEC (ICD-356.8) CORONARY ARTERY DISEASE (ICD-414.00) COPD  Past Surgical History: Reviewed history from 12/14/2007 and no changes required. CABG X4 (2002) ACDF Lumbar laminectomy & fusion Bladder; secondary to CA arthroscopy and bakers cyst  Family History: Reviewed history from 10/12/2006 and no changes required. Family History of CAD Male 1st degree relative <50 Family History Hypertension  Social History: Reviewed history from 02/08/2008 and no changes required. Married Former Smoker Drug use-no Regular exercise-no  Review of Systems  The patient denies anorexia, fever, weight loss, weight gain, vision loss, decreased hearing, hoarseness, chest pain, syncope, dyspnea on exertion, peripheral edema, prolonged cough, headaches, hemoptysis, abdominal pain, melena, hematochezia, severe indigestion/heartburn, hematuria, incontinence, genital sores, muscle weakness, suspicious skin lesions, transient blindness, difficulty walking, depression, unusual weight change, abnormal bleeding, enlarged lymph nodes, angioedema, breast masses, and testicular masses.    Physical Exam  General:  Well-developed,well-nourished,in no acute distress; alert,appropriate and cooperative throughout examination Head:  Normocephalic and atraumatic without obvious abnormalities. No apparent alopecia or balding. Eyes:  pupils equal and pupils round.   Ears:  R ear normal and L ear normal.   Nose:  no external deformity and no nasal discharge.   Mouth:  pharynx pink and moist.   Neck:  No deformities, masses, or tenderness noted. Lungs:  normal respiratory effort and no  wheezes.    increased I to E Heart:  normal rate and regular rhythm.  2/6 SM Abdomen:  Bowel sounds positive,abdomen soft and non-tender without masses, organomegaly or hernias noted. Msk:  joint tenderness and joint swelling.   Pulses:  R and L carotid,radial,femoral,dorsalis pedis and posterior tibial pulses are full and equal bilaterally Extremities:  No clubbing, cyanosis, edema, or deformity noted with normal full range of motion of all joints.   Neurologic:  No cranial nerve deficits noted. Station and gait are normal. Plantar reflexes are down-going bilaterally. DTRs are symmetrical throughout. Sensory, motor and coordinative functions appear intact.   Impression & Recommendations:  Problem # 1:  ULNAR NEUROPATHY, LEFT (ICD-354.2) Assessment New right elbow joint Informed consent obtained and then the joint was prepped in a sterile manor and 40 mg depo and 1/2 cc 1% lidocaine injected into the synovial space. After care discussed. Pt tolerated procedure well. '  Orders: Joint Aspirate / Injection, Intermediate (47829) Depo- Medrol 40mg  (J1030)  Problem # 2:  HYPERTENSION (ICD-401.9) Assessment: Unchanged  His updated medication list for  this problem includes:    Bystolic 10 Mg Tabs (Nebivolol hcl) .[name]... 1 once daily    Lisinopril 20 Mg Tabs (Lisinopril) .[name]... 1 once daily  BP today: 120/72 Prior BP: 130/80 (05/24/2009)  Prior 10 Yr Risk Heart Disease: N/A (10/12/2006)  Labs Reviewed: K+: 4.6 (04/17/2008) Creat: : 0.9 (04/17/2008)   Chol: 165 (04/17/2008)   HDL: 32.20 (04/17/2008)   LDL: DEL (04/08/2007)   TG: 245 (04/08/2007)  Problem # 3:  HYPERLIPIDEMIA NEC/NOS (ICD-272.4) Assessment: Unchanged  His updated medication list for this problem includes:    Vytorin 10-40 Mg Tabs (Ezetimibe-simvastatin) ..... Qhs    Niaspan 500 Mg Cr-tabs (Niacin (antihyperlipidemic)) .[name]... 1 once daily  Labs Reviewed: SGOT: 19 (04/08/2007)   SGPT: 22 (04/08/2007)  Lipid Goals: Chol  Goal: 200 (10/12/2006)   HDL Goal: 40 (10/12/2006)   LDL Goal: 100 (10/12/2006)   TG Goal: 150 (10/12/2006)  Prior 10 Yr Risk Heart Disease: N/A (10/12/2006)   HDL:32.20 (04/17/2008), 27.8 (04/08/2007)  LDL:DEL (04/08/2007)  Chol:165 (04/17/2008), 146 (04/08/2007)  Trig:245 (04/08/2007)  Problem # 4:  LOW BACK PAIN (ICD-724.2) Assessment: Unchanged  His updated medication list for this problem includes:    Etodolac 300 Mg Caps (Etodolac) .[name]... 1 once daily    Adult Aspirin Ec Low Strength 81 Mg Tbec (Aspirin) ..... Once daily    Oxycodone-acetaminophen 10-325 Mg Tabs (Oxycodone-acetaminophen) ..... One by mouth two times a day as directed  Discussed use of moist heat or ice, modified activities, medications, and stretching/strengthening exercises. Back care instructions given. To be seen in 2 weeks if no improvement; sooner if worsening of symptoms.   Complete Medication List: 1)  Bystolic 10 Mg Tabs (Nebivolol hcl) .[name].. 1 once daily 2)  Effexor 75 Mg Tabs (Venlafaxine hcl) .... Take 1 tablet by mouth bid 3)  Loratadine 10 Mg Tabs (Loratadine) .[name].. 1 once daily 4)  Folic Acid 400 Mcg Tabs (Folic acid) .... Take 1 tablet by mouth once a day 5)  Metoclopramide Hcl 10 Mg Tabs (Metoclopramide hcl) .... One by mouth qac 6)  Etodolac 300 Mg Caps (Etodolac) .[name].. 1 once daily 7)  Isosorbide Mononitrate Cr 60 Mg Tb24 (Isosorbide mononitrate) .... Two times a day 8)  Nexium 40 Mg Cpdr (Esomeprazole magnesium) .... One  by mouth bid 9)  Vytorin 10-40 Mg Tabs (Ezetimibe-simvastatin) .... Qhs 10)  Adult Aspirin Ec Low Strength 81 Mg Tbec (Aspirin) .... Once daily 11)  Oxycodone-acetaminophen 10-325 Mg Tabs (Oxycodone-acetaminophen) .... One by mouth two times a day as directed 12)  Symbicort 160-4.5 Mcg/act Aero (Budesonide-formoterol fumarate) .... Two puff by mouth two times a day rinse mouth with water after use 13)  Nitroquick 0.4 Mg Subl (Nitroglycerin) .... As needed 14)  Lisinopril 20 Mg Tabs  (Lisinopril) .[name].. 1 once daily 15)  Pataday 0.2 % Soln (Olopatadine hcl) .... One drop daily 16)  Prilosec 40 Mg Cpdr (Omeprazole) .[name].. 1 once daily 17)  Niaspan 500 Mg Cr-tabs (Niacin (antihyperlipidemic)) .[name].. 1 once daily 18)  Ropinirole Hcl 1 Mg Tabs (Ropinirole hcl) .[name].. 1 once daily 19)  Fexofenadine Hcl 180 Mg Tabs (Fexofenadine hcl) .[name].. 1 once daily as needed  Hypertension Assessment/Plan:      The patient's hypertensive risk group is category C: Target organ damage and/or diabetes.  Today's blood pressure is 120/72.  His blood pressure goal is < 140/90.  Patient Instructions: 1)  Please schedule a follow-up appointment in 2 months. Prescriptions: OXYCODONE-ACETAMINOPHEN 10-325 MG TABS (OXYCODONE-ACETAMINOPHEN) one by mouth two times a  day as directed  #60 x 0   Entered by:   Willy Eddy, LPN   Authorized by:   Stacie Glaze MD   Signed by:   Willy Eddy, LPN on 75/64/3329   Method used:   Print then Give to Patient   RxID:   [id number] OXYCODONE-ACETAMINOPHEN 10-325 MG TABS (OXYCODONE-ACETAMINOPHEN) one by mouth two times a day as directed  #60 x 0   Entered by:   Willy Eddy, LPN   Authorized by:   Stacie Glaze MD   Signed by:   Willy Eddy, LPN on 09/32/3557   Method used:   Print then Give to Patient   RxID:   [id number]

## 2010-02-18 NOTE — Letter (Signed)
Summary: [facility]   Imported By: Maryln Gottron 09/12/2009 09:11:31  _____________________________________________________________________  External Attachment:    Type:   Image     Comment:   External Document

## 2010-02-18 NOTE — Progress Notes (Signed)
Summary: REFILL REQUEST (Dosage increase from 1mg  to 2mg ?)  Phone Note Refill Request Message from:  Patient's Wife on October 23, 2009 9:28 AM  Refills Requested: Medication #1:  ROPINIROLE HCL 2 MG TABS 1 once daily   Notes: Dosage was increased to 2mg  per pts wife due to pt exp leg cramping..... [facility].    Initial call taken by: Debbra Riding,  October 23, 2009 9:28 AM    New/Updated Medications: ROPINIROLE HCL 2 MG TABS (ROPINIROLE HCL) 1 once daily Prescriptions: ROPINIROLE HCL 2 MG TABS (ROPINIROLE HCL) 1 once daily  #30 x 6   Entered by:   Willy Eddy, LPN   Authorized by:   Stacie Glaze MD   Signed by:   Willy Eddy, LPN on 54/09/8117   Method used:   Electronically to        [facility]  [phone]* (retail)       [address]       [place], [place]  [address]       Ph: [phone] or [phone]       Fax: [phone]   RxID:   [id number]

## 2010-02-18 NOTE — Consult Note (Signed)
Summary: [facility]  [facility] [place]   Imported By: Maryln Gottron 11/13/2009 15:18:55  _____________________________________________________________________  External Attachment:    Type:   Image     Comment:   External Document

## 2010-02-18 NOTE — Assessment & Plan Note (Signed)
Summary: 2 month fup//ccm/pt rsc from bmp/cjr   Vital Signs:  Patient profile:   70 year old male Height:      69 inches Weight:      200 pounds BMI:     29.64 Temp:     98.2 degrees F oral Pulse rate:   72 / minute Resp:     14 per minute BP sitting:   130 / 80  (left arm)  Vitals Entered By: Willy Eddy, LPN (May 24, 2593 10:43 AM) CC: roa   Primary Care Provider:  Darryll Capers  CC:  roa.  History of Present Illness: The pt was seen by cardiology who did extensive testing and lipid ( echo, cardioloyte and blood work) the lipids were at goal except for the HDL and niacin wa restarted The other labs were normal and the cardiac testing was normal the etiology of the SOB is most likely CV deconditioning and COPD has a hx of pulmonary nodule hx of a smoking and COPD upper back pain in back muscles  Preventive Screening-Counseling & Management  Alcohol-Tobacco     Smoking Status: quit     Year Quit: 1977     Passive Smoke Exposure: no  Problems Prior to Update: 1)  Breast Mass, Left  (ICD-611.72) 2)  Urinary Obstruction Unspecified  (ICD-599.60) 3)  Other Specified Anemias  (ICD-285.8) 4)  Actinic Keratosis  (ICD-702.0) 5)  Loc Osteoarthros Not Spec Prim/sec Lower Leg  (ICD-715.36) 6)  Dry Eye Syndrome  (ICD-375.15) 7)  COPD  (ICD-496) 8)  Actinic Keratosis, Ear, Right  (ICD-702.0) 9)  Malaise and Fatigue  (ICD-780.79) 10)  Esophagitis  (ICD-530.10) 11)  Colonic Polyps  (ICD-211.3) 12)  Carcinoma, Bladder, Hx of  (ICD-V10.51) 13)  Stricture and Stenosis of Esophagus  (ICD-530.3) 14)  Barretts Esophagus  (ICD-530.85) 15)  Loc Osteoarthros Not Spec Prim/sec Lower Leg  (ICD-715.36) 16)  Uns Advrs Eff Uns Rx Medicinal&biological Sbstnc  (ICD-995.20) 17)  Other Malaise and Fatigue  (ICD-780.79) 18)  Hypothyroidism  (ICD-244.9) 19)  Family History of Cad Male 1st Degree Relative <50  (ICD-V17.3) 20)  Hypertension  (ICD-401.9) 21)  Anemia, B12 Deficiency   (ICD-281.1) 22)  Gerd, Severe  (ICD-530.81) 23)  Hyperlipidemia Nec/nos  (ICD-272.4) 24)  Low Back Pain  (ICD-724.2) 25)  Depression  (ICD-311) 26)  Allergic Rhinitis  (ICD-477.9) 27)  Neuropathy, 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3) 14)  Barretts Esophagus  (ICD-530.85) 15)  Loc Osteoarthros Not Spec Prim/sec Lower Leg  (ICD-715.36) 16)  Uns Advrs Eff Uns Rx Medicinal&biological Sbstnc  (ICD-995.20) 17)  Other Malaise and Fatigue  (ICD-780.79) 18)  Hypothyroidism  (ICD-244.9) 19)  Family History of Cad Male 1st Degree Relative <50  (ICD-V17.3) 20)  Hypertension  (ICD-401.9) 21)  Anemia, B12 Deficiency  (ICD-281.1) 22)  Gerd, Severe  (ICD-530.81) 23)  Hyperlipidemia Nec/nos  (ICD-272.4) 24)  Low Back Pain  (  ICD-724.2) 25)  Depression  (ICD-311) 26)  Allergic Rhinitis  (ICD-477.9) 27)  Neuropathy, 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) 28)  Coronary Artery Disease  (ICD-414.00)  Medications Prior to Update: 1)  Metoprolol Succinate 200 Mg  Tb24 (Metoprolol Succinate) .... Once Daily 2)  Effexor 75 Mg  Tabs (Venlafaxine Hcl) .... Take 1 Tablet By Mouth Bid 3)  Loratadine 10 Mg Tabs (Loratadine) .[name].. 1 Once Daily 4)  Folic Acid 400 Mcg  Tabs (Folic Acid) .... Take 1 Tablet By Mouth Once A Day 5)  Metoclopramide Hcl 10 Mg Tabs (Metoclopramide Hcl) .... One By Mouth Qac 6)  Etodolac  300 Mg Caps (Etodolac) .[name].. 1 Once Daily 7)  Isosorbide Mononitrate Cr 60 Mg  Tb24 (Isosorbide Mononitrate) .... Two Times A Day 8)  Nexium 40 Mg Cpdr (Esomeprazole Magnesium) .... One  By Mouth Bid 9)  Vytorin 10-40 Mg  Tabs (Ezetimibe-Simvastatin) .... Qhs 10)  Adult Aspirin Ec Low Strength 81 Mg  Tbec (Aspirin) .... Once Daily 11)  Oxycodone-Acetaminophen 10-325 Mg Tabs (Oxycodone-Acetaminophen) .... One By Mouth Two Times A Day As Directed 12)  Symbicort 160-4.5 Mcg/act  Aero (Budesonide-Formoterol Fumarate) .... Once Daily 13)  Nitroquick 0.4 Mg  Subl (Nitroglycerin) .... As Needed 14)  Lisinopril 20 Mg Tabs (Lisinopril) .[name].. 1 Once Daily 15)  Pataday 0.2 % Soln (Olopatadine Hcl) .... One Drop Daily 16)  Prilosec 40 Mg Cpdr (Omeprazole) .[name].. 1 Once Daily  Current Medications (verified): 1)  Bystolic 10 Mg Tabs (Nebivolol Hcl) .[name].. 1 Once Daily 2)  Effexor 75 Mg  Tabs (Venlafaxine Hcl) .... Take 1 Tablet By Mouth Bid 3)  Loratadine 10 Mg Tabs (Loratadine) .[name].. 1 Once Daily 4)  Folic Acid 400 Mcg  Tabs (Folic Acid) .... Take 1 Tablet By Mouth Once A Day 5)  Metoclopramide Hcl 10 Mg Tabs (Metoclopramide Hcl) .... One By Mouth Qac 6)  Etodolac 300 Mg Caps (Etodolac) .[name].. 1 Once Daily 7)  Isosorbide Mononitrate Cr 60 Mg  Tb24 (Isosorbide Mononitrate) .... Two Times A Day 8)  Nexium 40 Mg Cpdr (Esomeprazole Magnesium) .... One  By Mouth Bid 9)  Vytorin 10-40 Mg  Tabs (Ezetimibe-Simvastatin) .... Qhs 10)  Adult Aspirin Ec Low Strength 81 Mg  Tbec (Aspirin) .... Once Daily 11)  Oxycodone-Acetaminophen 10-325 Mg Tabs (Oxycodone-Acetaminophen) .... One By Mouth Two Times A Day As Directed 12)  Symbicort 160-4.5 Mcg/act  Aero (Budesonide-Formoterol Fumarate) .... Two Puff By Mouth Two Times A Day Rinse Mouth With Water After Use 13)  Nitroquick 0.4 Mg  Subl (Nitroglycerin) .... As Needed 14)  Lisinopril 20 Mg Tabs (Lisinopril) .[name].. 1 Once Daily 15)  Pataday 0.2 % Soln (Olopatadine Hcl) .... One  Drop Daily 16)  Prilosec 40 Mg Cpdr (Omeprazole) .[name].. 1 Once Daily 17)  Niaspan 500 Mg Cr-Tabs (Niacin (Antihyperlipidemic)) .[name].. 1 Once Daily 18)  Ropinirole Hcl 1 Mg Tabs (Ropinirole Hcl) .[name].. 1 Once Daily 19)  Fexofenadine Hcl 180 Mg Tabs (Fexofenadine Hcl) .[name].. 1 Once Daily As Needed  Allergies (verified): No Known Drug Allergies  Past History:  Family History: Last updated: 10/12/2006 Family History of CAD Male 1st degree relative <50 Family History Hypertension  Social History: Last updated: 02/08/2008 Married Former Smoker Drug use-no Regular exercise-no  Risk Factors: Exercise: no (02/08/2008)  Risk Factors: Smoking Status: quit (05/24/2009) Passive Smoke Exposure: no (05/24/2009)  Past medical, surgical, family and social histories (including risk factors) reviewed, and no changes noted (except as noted below).  Past Medical History: Reviewed history  from 10/13/2007 and no changes required. Current Problems:  ESOPHAGITIS (ICD-530.10) COLONIC POLYPS (ICD-211.3) CARCINOMA, BLADDER, HX OF (ICD-V10.51) STRICTURE AND STENOSIS OF ESOPHAGUS (ICD-530.3) BARRETTS ESOPHAGUS (ICD-530.85) LOC OSTEOARTHROS NOT SPEC PRIM/SEC LOWER LEG (ICD-715.36) UNS ADVRS EFF UNS RX MEDICINAL&BIOLOGICAL SBSTNC (ICD-995.20) OTHER MALAISE AND FATIGUE (ICD-780.79) HYPOTHYROIDISM (ICD-244.9) FAMILY HISTORY OF CAD MALE 1ST DEGREE RELATIVE <50 (ICD-V17.3) HYPERTENSION (ICD-401.9) ANEMIA, B12 DEFICIENCY (ICD-281.1) GERD, SEVERE (ICD-530.81) HYPERLIPIDEMIA NEC/NOS (ICD-272.4) LOW BACK PAIN (ICD-724.2) DEPRESSION (ICD-311) ALLERGIC RHINITIS (ICD-477.9) NEUROPATHY, IDIOPATHIC PERIPHERAL NEC (ICD-356.8) CORONARY ARTERY DISEASE (ICD-414.00) COPD  Past Surgical History: Reviewed history from 12/14/2007 and no changes required. CABG X4 (2002) ACDF Lumbar laminectomy & fusion Bladder; secondary to CA arthroscopy and bakers cyst  Family History: Reviewed history from 10/12/2006 and no  changes required. Family History of CAD Male 1st degree relative <50 Family History Hypertension  Social History: Reviewed history from 02/08/2008 and no changes required. Married Former Smoker Drug use-no Regular exercise-no  Review of Systems       The patient complains of hoarseness and dyspnea on exertion.  The patient denies anorexia, fever, weight loss, weight gain, vision loss, decreased hearing, chest pain, syncope, peripheral edema, prolonged cough, headaches, hemoptysis, abdominal pain, melena, hematochezia, severe indigestion/heartburn, hematuria, incontinence, genital sores, muscle weakness, suspicious skin lesions, transient blindness, difficulty walking, depression, unusual weight change, abnormal bleeding, enlarged lymph nodes, angioedema, breast masses, and testicular masses.    Physical Exam  General:  Well-developed,well-nourished,in no acute distress; alert,appropriate and cooperative throughout examination Head:  Normocephalic and atraumatic without obvious abnormalities. No apparent alopecia or balding. Eyes:  pupils equal and pupils round.   Ears:  R ear normal and L ear normal.   Nose:  no external deformity and no nasal discharge.   Neck:  No deformities, masses, or tenderness noted. Lungs:  normal respiratory effort and no wheezes.    increased I to E Heart:  normal rate and regular rhythm.  2/6 SM Abdomen:  Bowel sounds positive,abdomen soft and non-tender without masses, organomegaly or hernias noted. Msk:  decreased ROM, joint tenderness, and joint swelling.  lumbar lordosis and SI joint tenderness.   Neurologic:  alert & oriented X3 and DTRs symmetrical and normal.     Impression & Recommendations:  Problem # 1:  COPD (ICD-496) Assessment Deteriorated we discussed cardiopulmony exercizes  and the change from lopressor to bystollic and willl schedule PFT at next OV  His updated medication list for this problem includes: will increase the dose and  fregency    Symbicort 160-4.5 Mcg/act Aero (Budesonide-formoterol fumarate) .[name]..[name] Two puff by mouth two times a day rinse mouth with water after use  Vaccines Reviewed: Flu Vax: Historical (10/19/2008) and need CPX  Problem # 2:  HYPOTHYROIDISM (ICD-244.9) Assessment: Unchanged  Labs Reviewed: TSH: 2.07 (09/17/2008)    Chol: 165 (04/17/2008)   HDL: 32.20 (04/17/2008)   LDL: DEL (04/08/2007)   TG: 245 (04/08/2007)  Problem # 3:  ESOPHAGITIS (ICD-530.10) be sure to take the PPI and need to have review visit with GI His updated medication list for this problem includes:    Nexium 40 Mg Cpdr (Esomeprazole magnesium) ..... One  by mouth bid    Prilosec 40 Mg Cpdr (Omeprazole) .[name]... 1 once daily  Problem # 4:  BARRETTS ESOPHAGUS (ICD-530.85) as above  Problem # 5:  HYPERTENSION (ICD-401.9)  stable His updated medication list for this problem includes:    Bystolic 10 Mg Tabs (Nebivolol hcl) .[name]... 1 once daily    Lisinopril 20 Mg Tabs (Lisinopril) .Marland KitchenMarland KitchenMarland KitchenMarland Kitchen  1 once daily  BP today: 130/80 Prior BP: 150/80 (03/25/2009)  Prior 10 Yr Risk Heart Disease: N/A (10/12/2006)  Labs Reviewed: K+: 4.6 (04/17/2008) Creat: : 0.9 (04/17/2008)   Chol: 165 (04/17/2008)   HDL: 32.20 (04/17/2008)   LDL: DEL (04/08/2007)   TG: 245 (04/08/2007)  Problem # 6:  MALAISE AND FATIGUE (ICD-780.79) b12 today  Complete Medication List: 1)  Bystolic 10 Mg Tabs (Nebivolol hcl) .[name].. 1 once daily 2)  Effexor 75 Mg Tabs (Venlafaxine hcl) .... Take 1 tablet by mouth bid 3)  Loratadine 10 Mg Tabs (Loratadine) .[name].. 1 once daily 4)  Folic Acid 400 Mcg Tabs (Folic acid) .... Take 1 tablet by mouth once a day 5)  Metoclopramide Hcl 10 Mg Tabs (Metoclopramide hcl) .... One by mouth qac 6)  Etodolac 300 Mg Caps (Etodolac) .[name].. 1 once daily 7)  Isosorbide Mononitrate Cr 60 Mg Tb24 (Isosorbide mononitrate) .... Two times a day 8)  Nexium 40 Mg Cpdr (Esomeprazole magnesium) .... One  by mouth bid 9)  Vytorin 10-40 Mg Tabs  (Ezetimibe-simvastatin) .... Qhs 10)  Adult Aspirin Ec Low Strength 81 Mg Tbec (Aspirin) .... Once daily 11)  Oxycodone-acetaminophen 10-325 Mg Tabs (Oxycodone-acetaminophen) .... One by mouth two times a day as directed 12)  Symbicort 160-4.5 Mcg/act Aero (Budesonide-formoterol fumarate) .... Two puff by mouth two times a day rinse mouth with water after use 13)  Nitroquick 0.4 Mg Subl (Nitroglycerin) .... As needed 14)  Lisinopril 20 Mg Tabs (Lisinopril) .[name].. 1 once daily 15)  Pataday 0.2 % Soln (Olopatadine hcl) .... One drop daily 16)  Prilosec 40 Mg Cpdr (Omeprazole) .[name].. 1 once daily 17)  Niaspan 500 Mg Cr-tabs (Niacin (antihyperlipidemic)) .[name].. 1 once daily 18)  Ropinirole Hcl 1 Mg Tabs (Ropinirole hcl) .[name].. 1 once daily 19)  Fexofenadine Hcl 180 Mg Tabs (Fexofenadine hcl) .[name].. 1 once daily as needed  Other Orders: Vit B12 1000 mcg (J3420) Admin of Therapeutic Inj  intramuscular or subcutaneous ([address])  Patient Instructions: 1)  The cardiopulmonary exercize we recommned 2)   is 3)  level walk for 15 min every day ( as fast a you can tolerate walking) 4)  or 15 on stationary bike  daily 5)  we are getting a chest xray today 6)  Please schedule a follow-up appointment in 2 months. Prescriptions: OXYCODONE-ACETAMINOPHEN 10-325 MG TABS (OXYCODONE-ACETAMINOPHEN) one by mouth two times a day as directed  #60 x 0   Entered and Authorized by:   Stacie Glaze MD   Signed by:   Stacie Glaze MD on 05/24/2009   Method used:   Print then Give to Patient   RxID:   [id number] OXYCODONE-ACETAMINOPHEN 10-325 MG TABS (OXYCODONE-ACETAMINOPHEN) one by mouth two times a day as directed  #60 x 0   Entered and Authorized by:   Stacie Glaze MD   Signed by:   Stacie Glaze MD on 05/24/2009   Method used:   Print then Give to Patient   RxID:   [id number] ETODOLAC 300 MG CAPS (ETODOLAC) 1 once daily  #30 x 6   Entered by:   Willy Eddy, LPN   Authorized by:   Stacie Glaze  MD   Signed by:   Willy Eddy, LPN on 29/52/8413   Method used:   Electronically to        [facility]  [phone]* (retail)       [phone] [facility] [address]  [place], [place]  [address]       Ph: [phone] or [phone]       Fax: [phone]   RxID:   [id number]    Medication Administration  Injection # 1:    Medication: Vit B12 1000 mcg    Diagnosis: ANEMIA, B12 DEFICIENCY (ICD-281.1)    Route: IM    Site: L deltoid    Exp Date: 10/18/2010    Lot #: [id number]    Mfr: [employer] Regent    Patient tolerated injection without complications    Given by: Willy Eddy, LPN (May 24, 4268 10:46 AM)  Orders Added: 1)  Vit B12 1000 mcg [J3420] 2)  Admin of Therapeutic Inj  intramuscular or subcutaneous [96372] 3)  Est. Patient Level IV [62376]

## 2010-02-18 NOTE — Assessment & Plan Note (Signed)
Summary: B12 INJ // RS/PTS WIFE RSC/CJR   Nurse Visit   Allergies: No Known Drug Allergies  Medication Administration  Injection # 1:    Medication: Vit B12 1000 mcg    Diagnosis: ANEMIA, B12 DEFICIENCY (ICD-281.1)    Route: IM    Site: L deltoid    Exp Date: 10/17/2010    Lot #: [id number]    Mfr: [employer]    Patient tolerated injection without complications    Given by: Willy Eddy, LPN (July 09, 2009 12:22 PM)  Orders Added: 1)  Vit B12 1000 mcg [J3420] 2)  Admin of Therapeutic Inj  intramuscular or subcutaneous [95621]

## 2010-02-20 NOTE — Assessment & Plan Note (Signed)
Summary: B12 INJ/NJR   Nurse Visit   Allergies: No Known Drug Allergies  Medication Administration  Injection # 1:    Medication: Vit B12 1000 mcg    Diagnosis: ANEMIA, B12 DEFICIENCY (ICD-281.1)    Route: IM    Site: L deltoid    Exp Date: 09/18/2011    Lot #: [id number]    Mfr: [employer] Regent    Patient tolerated injection without complications    Given by: Willy Eddy, LPN (February 05, 2010 5:06 PM)  Orders Added: 1)  Vit B12 1000 mcg [J3420] 2)  Admin of Therapeutic Inj  intramuscular or subcutaneous [54098]

## 2010-02-21 NOTE — Letter (Signed)
Summary: [facility]   [facility]   Imported By: Maryln Gottron 04/12/2009 15:27:39  _____________________________________________________________________  External Attachment:    Type:   Image     Comment:   External Document

## 2010-03-17 ENCOUNTER — Ambulatory Visit (INDEPENDENT_AMBULATORY_CARE_PROVIDER_SITE_OTHER): Payer: MEDICARE | Admitting: Internal Medicine

## 2010-03-17 DIAGNOSIS — E538 Deficiency of other specified B group vitamins: Secondary | ICD-10-CM

## 2010-03-17 MED ORDER — CYANOCOBALAMIN 1000 MCG/ML IJ SOLN
1000.0000 ug | Freq: Once | INTRAMUSCULAR | Status: AC
  Administered 2010-03-17: 1000 ug via INTRAMUSCULAR

## 2010-03-28 ENCOUNTER — Encounter: Payer: Self-pay | Admitting: Internal Medicine

## 2010-03-31 ENCOUNTER — Ambulatory Visit (INDEPENDENT_AMBULATORY_CARE_PROVIDER_SITE_OTHER): Payer: MEDICARE | Admitting: Internal Medicine

## 2010-03-31 ENCOUNTER — Encounter: Payer: Self-pay | Admitting: Internal Medicine

## 2010-03-31 DIAGNOSIS — J449 Chronic obstructive pulmonary disease, unspecified: Secondary | ICD-10-CM

## 2010-03-31 DIAGNOSIS — R918 Other nonspecific abnormal finding of lung field: Secondary | ICD-10-CM

## 2010-03-31 DIAGNOSIS — K2270 Barrett's esophagus without dysplasia: Secondary | ICD-10-CM

## 2010-03-31 DIAGNOSIS — G5620 Lesion of ulnar nerve, unspecified upper limb: Secondary | ICD-10-CM

## 2010-03-31 DIAGNOSIS — M545 Low back pain, unspecified: Secondary | ICD-10-CM

## 2010-03-31 DIAGNOSIS — G608 Other hereditary and idiopathic neuropathies: Secondary | ICD-10-CM

## 2010-03-31 DIAGNOSIS — I10 Essential (primary) hypertension: Secondary | ICD-10-CM

## 2010-03-31 DIAGNOSIS — L570 Actinic keratosis: Secondary | ICD-10-CM

## 2010-03-31 MED ORDER — OXYCODONE-ACETAMINOPHEN 10-325 MG PO TABS: 1.0000 | ORAL_TABLET | Freq: Two times a day (BID) | ORAL | Status: DC | PRN

## 2010-03-31 NOTE — Assessment & Plan Note (Signed)
Has lost weight and subjectively feels that his breathing is better  He is due for monitoring for pulmonary nodules and has some point tenderness on his posterior chest wall on the right therefore we will order a CT of the chest for continued monitoring

## 2010-03-31 NOTE — Assessment & Plan Note (Signed)
He is due monitoring for his Barrett's esophagus with an EGD and he is also due colonoscopy screening we'll refer to Dr. Oscar La for these procedures

## 2010-03-31 NOTE — Assessment & Plan Note (Signed)
Refill his Percocet x3

## 2010-03-31 NOTE — Progress Notes (Signed)
  Subjective:    Patient ID: [name], male    DOB: Sep 20, 1940, 70 y.o.   MRN: [id number]  HPI Patient is a 70 year old white male who presents for followup of multiple medical problems including hypothyroidism hypertension hyperlipidemia and chief complaint of actinic keratoses on his for head ears and right hand.  He states that his pain control has been adequate he has not *early refill on his Percocet refills will be given today we reviewed the laboratory values drawn from last visit about his BUN creatinine and his lipid panels which are stable he denies any increase in chest pain shortness of breath PND orthopnea this time    Review of Systems  Constitutional: Negative for fever and fatigue.  HENT: Negative for hearing loss, congestion, neck pain and postnasal drip.   Eyes: Negative for discharge, redness and visual disturbance.  Respiratory: Negative for cough, shortness of breath and wheezing.   Cardiovascular: Negative for leg swelling.  Gastrointestinal: Negative for abdominal pain, constipation and abdominal distention.  Genitourinary: Negative for urgency and frequency.  Musculoskeletal: Negative for joint swelling and arthralgias.  Skin: Negative for color change and rash.  Neurological: Negative for weakness and light-headedness.  Hematological: Negative for adenopathy.  Psychiatric/Behavioral: Negative for behavioral problems.   Past Medical History  Diagnosis Date  . Esophagitis   . Colon polyps   . History of bladder cancer   . Esophagus, Barrett's   . Stenosis of esophagus   . Localized osteoarthrosis, lower leg   . Malaise and fatigue   . Hypothyroidism   . Hypertension   . Anemia   . B12 deficiency anemia   . GERD (gastroesophageal reflux disease)   . Hyperlipidemia   . Depression   . Allergy   . CAD (coronary artery disease)   . COPD (chronic obstructive pulmonary disease)   . Low back pain    Past Surgical History  Procedure Date  . Coronary  artery bypass graft     x4  . Cervical discectomy     ACDF  . Lumbar laminectomy     and fusion  . Bladder cancer   . Popliteal synovial cyst excision   . Knee arthroscopy     reports that he quit smoking about 37 years ago. He does not have any smokeless tobacco history on file. He reports that he does not drink alcohol or use illicit drugs. family history includes Cancer in his mother; Coronary artery disease in an unspecified family member; Hypertension in his father; and Stroke in his father. No Known Allergies      Objective:   Physical Exam  Constitutional: He is oriented to person, place, and time. He appears well-developed and well-nourished.  HENT:  Head: Normocephalic and atraumatic.  Eyes: Conjunctivae are normal. Pupils are equal, round, and reactive to light.  Neck: Normal range of motion. Neck supple.  Cardiovascular: Normal rate and regular rhythm.   Pulmonary/Chest: Effort normal and breath sounds normal.  Abdominal: Soft. Bowel sounds are normal.  Musculoskeletal: Normal range of motion.  Neurological: He is alert and oriented to person, place, and time.  Skin:        Multiple actinic keratoses are detected on the years the temples bilaterally and on the left cheek and right forearm          Assessment & Plan:

## 2010-03-31 NOTE — Assessment & Plan Note (Signed)
Stable, but has increased numbness in the bottom of his feet. Has an appointment with the podiatrist for foot check

## 2010-03-31 NOTE — Assessment & Plan Note (Signed)
Persistent numbness and tingling in the right arm to the hand after all her release surgery the patient is doing physical therapy at home has a return appointment with the surgeon  In May.

## 2010-03-31 NOTE — Assessment & Plan Note (Signed)
We treated actinic keratoses on both earlobes both temple areas and right hand for a total of 5 lesions  Informed consent was obtained in the lesion was treated for 60 seconds of liquid nitrogen application the patient tolerated the procedure well as procedural care was discussed with the patient and instructions should the lesion reappears contact our office immediately

## 2010-03-31 NOTE — Assessment & Plan Note (Addendum)
Blood pressure is stable today no significant increase in chest pain on an ACE inhibitor and a long-acting nitrate with no swelling in his feet at this time review of his basic metabolic panel from the last visit shows stable creatinine and potassium on his current medications no change planned at this time we'll monitor  Labs at next visit

## 2010-04-04 ENCOUNTER — Encounter: Payer: Self-pay | Admitting: Gastroenterology

## 2010-04-04 ENCOUNTER — Ambulatory Visit (INDEPENDENT_AMBULATORY_CARE_PROVIDER_SITE_OTHER)
Admission: RE | Admit: 2010-04-04 | Discharge: 2010-04-04 | Disposition: A | Discharge status: Home / Self Care | Payer: Medicare Other | Source: Ambulatory Visit | Attending: Internal Medicine | Admitting: Internal Medicine

## 2010-04-04 DIAGNOSIS — R918 Other nonspecific abnormal finding of lung field: Secondary | ICD-10-CM

## 2010-04-04 DIAGNOSIS — J4489 Other specified chronic obstructive pulmonary disease: Secondary | ICD-10-CM

## 2010-04-04 DIAGNOSIS — J449 Chronic obstructive pulmonary disease, unspecified: Secondary | ICD-10-CM

## 2010-04-08 NOTE — Letter (Signed)
Summary: Pre Visit Letter Revised  [facility]  [address] [place], [place] [address]   Phone: [phone]  Fax: [phone]        04/04/2010 MRN: [id number]  [name] [address] [place], [place]  [address]  [place]             Procedure Date:  04-29-10 2pm           Dr. Arlyce Dice  [facility]   Welcome to the Gastroenterology Division at [facility].    You are scheduled to see a nurse for your pre-procedure visit on 04-15-10 at 11am on the 3rd floor at [facility], [address]  We ask that you try to arrive at our office 15 minutes prior to your appointment time to allow for check-in.  Please take a minute to review the attached form.  If you answer "Yes" to one or more of the questions on the first page, we ask that you call the person listed at your earliest opportunity.  If you answer "No" to all of the questions, please complete the rest of the form and bring it to your appointment.    Your nurse visit will consist of discussing your medical and surgical history, your immediate family medical history, and your medications.   If you are unable to list all of your medications on the form, please bring the medication bottles to your appointment and we will list them.  We will need to be aware of both prescribed and over the counter drugs.  We will need to know exact dosage information as well.    Please be prepared to read and sign documents such as consent forms, a financial agreement, and acknowledgement forms.  If necessary, and with your consent, a friend or relative is welcome to sit-in on the nurse visit with you.  Please bring your insurance card so that we may make a copy of it.  If your insurance requires a referral to see a specialist, please bring your referral form from your primary care physician.  No co-pay is required for this nurse visit.     If you cannot keep your appointment, please call [phone] to cancel or reschedule prior  to your appointment date.  This allows [place] the opportunity to schedule an appointment for another patient in need of care.    Thank you for choosing [facility] for your medical needs.  We appreciate the opportunity to care for you.  Please visit [place] at our website  to learn more about our practice.  Sincerely, The Gastroenterology Division

## 2010-04-08 NOTE — Progress Notes (Signed)
Pts wife informed.

## 2010-04-15 ENCOUNTER — Ambulatory Visit (AMBULATORY_SURGERY_CENTER): Payer: Medicare Other | Admitting: *Deleted

## 2010-04-15 VITALS — Ht 69.0 in | Wt 190.3 lb

## 2010-04-15 DIAGNOSIS — K2270 Barrett's esophagus without dysplasia: Secondary | ICD-10-CM

## 2010-04-24 ENCOUNTER — Other Ambulatory Visit: Payer: Self-pay | Admitting: Internal Medicine

## 2010-04-28 ENCOUNTER — Encounter: Payer: Self-pay | Admitting: *Deleted

## 2010-04-28 LAB — POCT I-STAT 4, (NA,K, GLUC, HGB,HCT)
HCT: 41 % (ref 39.0–52.0)
Sodium: 140 mEq/L (ref 135–145)

## 2010-04-28 LAB — POCT I-STAT, CHEM 8
Calcium, Ion: 1.24 mmol/L (ref 1.12–1.32)
Chloride: 104 mEq/L (ref 96–112)
Creatinine, Ser: 1 mg/dL (ref 0.4–1.5)
Glucose, Bld: 125 mg/dL — ABNORMAL HIGH (ref 70–99)
HCT: 43 % (ref 39.0–52.0)
Potassium: 4.1 mEq/L (ref 3.5–5.1)

## 2010-04-29 ENCOUNTER — Ambulatory Visit (AMBULATORY_SURGERY_CENTER): Payer: Medicare Other | Admitting: Gastroenterology

## 2010-04-29 ENCOUNTER — Encounter: Payer: Self-pay | Admitting: Gastroenterology

## 2010-04-29 VITALS — BP 121/65 | HR 60 | Temp 97.4°F | Resp 20 | Ht 70.0 in | Wt 190.0 lb

## 2010-04-29 DIAGNOSIS — K2270 Barrett's esophagus without dysplasia: Secondary | ICD-10-CM

## 2010-04-29 DIAGNOSIS — K222 Esophageal obstruction: Secondary | ICD-10-CM

## 2010-04-29 DIAGNOSIS — K2940 Chronic atrophic gastritis without bleeding: Secondary | ICD-10-CM

## 2010-04-29 DIAGNOSIS — K2970 Gastritis, unspecified, without bleeding: Secondary | ICD-10-CM

## 2010-04-29 DIAGNOSIS — R1310 Dysphagia, unspecified: Secondary | ICD-10-CM

## 2010-04-29 DIAGNOSIS — K2990 Gastroduodenitis, unspecified, without bleeding: Secondary | ICD-10-CM

## 2010-04-29 HISTORY — PX: ESOPHAGOGASTRODUODENOSCOPY: SHX1529

## 2010-04-29 MED ORDER — SODIUM CHLORIDE 0.9 % IV SOLN: 500.0000 mL | INTRAVENOUS | Status: DC

## 2010-04-29 NOTE — Patient Instructions (Signed)
Esophageal Stricture (Narrowing) The esophagus is the long, narrow tube which carries food and liquid from the mouth to the stomach. Sometimes a part of the esophagus becomes narrow and makes it difficult, painful, or even impossible to swallow. This is called an esophageal stricture.  SYMPTOMS Some of the problems are difficulty swallowing or pain with swallowing. CAUSES Common causes of blockage or strictures of the esophagus are:  Exposure of the lower esophagus to the acid from the stomach may cause narrowing.   Hiatal hernia in which a small part of the stomach bulges up through the diaphragm can cause a narrowing in the bottom of the esophagus.   Scleroderma is a tissue disorder that affects the esophagus and makes swallowing difficult.   Achalasia is an absence of nerves in the lower esophagus and to the esophageal sphincter. This absence of nerves may be congenital (present since birth). This can cause irregular spasms which do not allow food and fluid through.   Strictures may develop from swallowing materials which damage the esophagus. Examples are acids or alkalis such as lye.   Schatzki's Ring is a narrow ring of non-cancerous tissue which narrows the lower esophagus. The cause of this is unknown.   Growths can block the esophagus.  DIAGNOSIS Your caregiver often suspects this problem by taking a medical history. They will also do a physical exam. They may then take X-rays and/or perform an endoscopy. Endoscopy is an exam in which a tube like a small flexible telescope is used to look at your esophagus.  TREATMENT AND PROCEDURE  One form of treatment is to dilate the narrow area. This means to stretch it.   When this is not successful, chest surgery may be required. This is a much more extensive form of treatment with a longer recovery time.  Both of the above treatments make the passage of food and water into the stomach easier. They also make it easier for stomach contents  to bubble back into the esophagus. Special medications may be used following the procedure to help prevent further narrowing. Medications may be used to lower the amount of acid in the stomach juice.  SEEK IMMEDIATE MEDICAL CARE IF:  Your swallowing is becoming more painful, difficult, or you are unable to swallow.   You vomit up blood.   You develop black tarry stools.   You develop chills or an unexplained fever of over 101 F (38.3 C).   You develop chest or abdominal pain.   You develop shortness of breath, feel lightheaded, or faint.  Follow up with medical care as your caregiver suggests. Document Released: 09/15/2005 Document Re-Released: 11/02/2006 [facility] Patient Information 2011 [place], [place]  .Barrett's Esophagus The esophagus is the muscular tube that carries food and saliva from the mouth to the stomach. Barrett's esophagus involves changes in the esophagus. Some of its lining is replaced by a type of tissue similar to that found in the intestine. This process is called intestinal metaplasia. While Barrett's esophagus may cause no symptoms itself, a small number of people with this condition develop a relatively rare but often deadly type of cancer of the esophagus. It is called esophageal adenocarcinoma. Barrett's esophagus is associated with the common condition called GERD (gastroesophageal reflux disease). HOW THE ESOPHAGUS WORKS The esophagus carries food, liquids, and saliva from the mouth to the stomach. The stomach acts as a container to start digestion and pump food and liquids into the intestines in a controlled process. Food can then be properly digested  over time. Nutrients can be taken in (absorbed) by the intestines. The esophagus moves food to the stomach by coordinated contractions of its muscular lining. This process is automatic. People are usually not aware of it. Many people have felt their esophagus when they:  Swallow something too large.   Try to eat  too quickly.   Drink very hot or cold liquids.  They then feel the movement of the food or drink down the esophagus into the stomach. This may be an uncomfortable feeling. DIGESTIVE TRACT The muscular layers of the esophagus are normally pinched together at both the upper and lower ends by muscles. These muscles are called sphincters. When a person swallows, the sphincters relax automatically. This allows food or drink to pass from the mouth, into the stomach. The muscles then close rapidly. This prevents the swallowed food or drink from leaking out of the stomach, back into the esophagus or into the mouth. These muscles make it possible to swallow while lying down or even upside-down. When people belch to release swallowed air or gas from carbonated beverages, the sphincters relax. Then small amounts of food or drink may come back up, briefly. This condition is called reflux. The esophagus quickly squeezes the material back into the stomach. This is considered normal. These functions of the esophagus are an important part of everyday life. However, people who must have their esophagus removed, for example because of cancer, can live a relatively healthy life without it. GERD (GASTROESOPHAGEAL REFLUX DISEASE) Having some stomach contents (liquids or gas) sometimes reflux (come back up from the stomach into the esophagus) is considered normal. When it happens often, and causes other symptoms, it is considered a medical problem or disease. However, it is not necessarily a serious one or one that requires seeing a caregiver. The stomach produces acid and enzymes to digest food. When this mixture refluxes into the esophagus more often than normal or for a longer period of time than normal, it may produce symptoms. These symptoms are often called acid reflux. They are often described by people as heartburn, indigestion, or "gas". The symptoms often consist of a burning sensation below and behind the lower part  of the breastbone or sternum. Almost everyone has experienced these symptoms at least once. This is typically a result of overeating. Other things that provoke GERD symptoms include:  Being overweight.   Eating certain types of foods.   Being pregnant.  In most people, GERD symptoms may last only a short time and require no treatment at all.  More continual symptoms are often quickly relieved by over-the-counter acid-reducing agents, such as antacids. Other drugs used to relieve GERD symptoms are antisecretory drugs, such as histamine2 (H2) blockers or proton pump inhibitors. People who have symptoms often should talk with their caregiver. Other diseases can have similar symptoms. Prescription medicines, together with other actions, might be needed to reduce reflux. GERD that is untreated over a long period can lead to problems. An example is an ulcer in the esophagus, that could cause bleeding. Another common problem is scar tissue that blocks the movement of swallowed food and drink through the esophagus. This condition is called stricture. Esophageal reflux may also cause certain less common symptoms. These include hoarseness or chronic cough. It sometimes provokes conditions such as asthma. While most patients find that lifestyle changes and acid-blocking drugs relieve their symptoms, caregivers sometimes advise surgery. Overall, GERD is one of the most common medical conditions. About 20 percent of the population can  be affected over a lifetime. GERD AND BARRETT'S ESOPHAGUS The exact causes of Barrett's esophagus are not known. It is thought to be caused in part by the same factors that cause GERD. People who do not have heartburn can have Barrett's esophagus. However, it is found about 3 to 5 times more often in people with this condition. Barrett's esophagus is uncommon in children. The average age at diagnosis is 94. But it is usually difficult to know when the problem started. It is about  twice as common in men as in women. It is much more common in white men than in men of other races. BARRETT'S ESOPHAGUS AND CANCER OF THE ESOPHAGUS Barrett's esophagus does not cause symptoms itself. However, it seems to precede the development of a particular kind of cancer. This cancer is esophageal adenocarcinoma. The risk of developing this cancer is 30 to 125 times higher in people who have Barrett's esophagus than in people who do not. This type of cancer is increasing quickly in white men. The increase is possibly related to the rise in obesity and GERD. For people who have Barrett's esophagus, the risk of getting cancer of the esophagus is small. It is less than 1 percent (0.4 percent to 0.5 percent) per year. Esophageal adenocarcinoma is often not curable. This is partly because the disease is often discovered at a late stage and treatments are not effective. DIAGNOSIS (HOW TO TELL WHAT IS WRONG) AND SCREENING Diagnosing Barrett's esophagus is not easy. At the present time it cannot be diagnosed based on symptoms, physical exam, or blood tests. The only useful test is upper gastrointestinal endoscopy and biopsy. In this procedure, a flexible tube called an endoscope is used. This tool is like a pencil sized flexible telescope. It has a light and tiny camera. It is passed into the esophagus. If the tissue appears suspicious to your caregiver, biopsies must be done. A biopsy is the removal of a small piece of tissue. This is done using a pincher-like device passed through the endoscope. A pathologist is a specialist who examines body tissue samples. He or she examines the tissue under a microscope to confirm the diagnosis. Looking for a medical problem in people who do not know whether they have one is called screening. Currently, there are no commonly accepted guidelines on who should have endoscopy, to check for Barrett's esophagus. There are many reasons for the lack of firm recommendations about  screening. Among them are the great cost and occasional risk of side effects of the test. Also, the rate of finding Barrett's esophagus is low. Finding the problem early has not been proven to prevent deaths from cancer. Many caregivers advise that adult patients who are over the age of 71 and have had GERD symptoms for a number of years have endoscopy, to see whether they have Barrett's esophagus. Screening for this condition in people who have no symptoms is not advised. TREATMENT Barrett's esophagus has no cure, other than surgical removal of the esophagus. This is a serious operation. Surgery is advised only for people who have a high risk of developing cancer or who already have it. Most caregivers recommend treating GERD with acid-blocking drugs. This is sometimes linked to improvement in the extent of the Barrett's tissue. But this approach has not been proven to reduce the risk of cancer. Treating reflux with surgery for GERD also does not seem to cure Barrett's esophagus. Several experimental approaches are under study. One attempts to see whether destroying the Barrett's  tissue by heat or other means, through an endoscope, can get rid of the condition. But this approach has potential risks and unknown effectiveness. LOOKING FOR DYSPLASIA AND CANCER Occasional endoscopic examinations to look for early warning signs of cancer are generally advised for people who have Barrett's esophagus. This approach is called surveillance. When people who have Barrett's esophagus develop cancer, the process seems to go through an intermediate stage. In this stage cancer cells appear in the Barrett's tissue. This condition is called dysplasia. It can be seen only in biopsies with a microscope. The process is patchy and cannot be seen directly through the endoscope. So, multiple biopsies must be taken. Even then, it can be missed. The process of change from Barrett's to cancer seems to happen only in a few patients. It  is less than 1 percent per year. And it happens over a relatively long period of time. Most caregivers advise that patients with Barrett's esophagus undergo occasional endoscopy to have biopsies. The recommended time between endoscopies varies depending on circumstances. The best time interval has not been decided. TREATMENT FOR DYSPLASIA OR ESOPHAGEAL ADENOCARCINOMA If a person with Barrett's esophagus is found to have dysplasia or cancer, the caregiver will usually recommend surgery. This is if the person is strong enough and has a good chance of being cured. The type of surgery may vary. But it usually involves removing most of the esophagus and pulling the stomach up into the chest to attach it to what remains of the esophagus. Many patients with Barrett's esophagus are elderly. They may have many other medical problems that make surgery unwise. In these patients, other methods to treat dysplasia are being studied. HOPE THROUGH RESEARCH Many important questions about Barrett's esophagus need further research to:  Find better ways to identify people who have the problem.   Find out what causes it.   Test treatments that may prevent or get rid of it.   Find better treatments for people who have Barrett's esophagus with cancer.  The [employer], and the [employer], sponsor research programs to study Barrett's esophagus. IMPORTANT POINTS TO REMEMBER  In Barrett's esophagus, the cells lining the esophagus change. They become similar to the cells lining the intestine.   Barrett's esophagus is connected with gastroesophageal reflux disease or GERD.   A small number of people with Barrett's esophagus may develop esophageal cancer.   Barrett's esophagus is diagnosed by upper gastrointestinal endoscopy and biopsy.   People who have Barrett's esophagus should have periodic esophageal exams.   Taking acid-blocking drugs for GERD  may help improve Barrett's esophagus.   Removal of the esophagus is recommended only for people who have a high risk of developing cancer or who already have it.  ADDITIONAL RESOURCES For more information about GERD or Barrett's esophagus, contact: [employer] [address] [place], [place] [address] Phone: [phone] or [phone] Fax: [phone] Email: iffgd@iffgd .org Internet: www.iffgd.[facility] [employer] Information [employer]aringhouse [address] [place], [place] [address] Email: nddic@info [email or web address].si Document Released: 03/28/2003 Document Re-Released: 06/25/2009 [facility] Patient Information 2011 [place], [place].

## 2010-04-30 ENCOUNTER — Telehealth: Payer: Self-pay | Admitting: *Deleted

## 2010-04-30 ENCOUNTER — Ambulatory Visit (INDEPENDENT_AMBULATORY_CARE_PROVIDER_SITE_OTHER): Payer: Medicare Other | Admitting: Internal Medicine

## 2010-04-30 DIAGNOSIS — E538 Deficiency of other specified B group vitamins: Secondary | ICD-10-CM

## 2010-04-30 MED ORDER — CYANOCOBALAMIN 1000 MCG/ML IJ SOLN
1000.0000 ug | Freq: Once | INTRAMUSCULAR | Status: AC
  Administered 2010-04-30: 1000 ug via INTRAMUSCULAR

## 2010-04-30 NOTE — Telephone Encounter (Signed)

## 2010-05-14 ENCOUNTER — Encounter: Payer: Self-pay | Admitting: Gastroenterology

## 2010-05-22 ENCOUNTER — Other Ambulatory Visit: Payer: Self-pay | Admitting: Internal Medicine

## 2010-05-28 ENCOUNTER — Ambulatory Visit (INDEPENDENT_AMBULATORY_CARE_PROVIDER_SITE_OTHER): Payer: Medicare Other | Admitting: Internal Medicine

## 2010-05-28 DIAGNOSIS — E538 Deficiency of other specified B group vitamins: Secondary | ICD-10-CM

## 2010-05-28 MED ORDER — CYANOCOBALAMIN 1000 MCG/ML IJ SOLN
1000.0000 ug | Freq: Once | INTRAMUSCULAR | Status: AC
  Administered 2010-05-28: 1000 ug via INTRAMUSCULAR

## 2010-06-03 NOTE — Op Note (Signed)
[name], [name]             ACCOUNT NO.:  [id number]   MEDICAL RECORD NO.:  [id number]          PATIENT TYPE:  AMB   LOCATION:  [facility]                         FACILITY:  [facility]   PHYSICIAN:  Maretta Bees. [name], M.D.DATE OF BIRTH:  Aug 31, 1940   DATE OF PROCEDURE:  06/19/2008  DATE OF DISCHARGE:                               OPERATIVE REPORT   PREOPERATIVE DIAGNOSES:  Urethral stricture, history of bladder cancer.   POSTOPERATIVE DIAGNOSES:  Urethral stricture, history of bladder cancer,  rule out distal left ureteral cancer.   PROCEDURE:  Cystoscopy, urethral dilation with filiform and followers,  left retrograde pyelogram with interpretation, left ureteroscopy and  left double-J catheter placement.   SURGEON:  Dr. Larey Dresser.   ANESTHESIA:  General.   INDICATIONS:  This gentleman is having a very slow stream due to a  recurrent urethral stricture.  Incidentally also had a previous penile  implant placed at [facility] for Peyronie's disease.  This now is  nonfunctional.  He also needs surveillance cystoscopy for a history of  bladder cancer and retrograde pyelograms in coordination with that.   PROCEDURE:  The patient was brought to the operating room and placed in  lithotomy position.  External genitalia prepped and draped in usual  fashion.  He was cystoscoped and the anterior urethra was unremarkable  until I reached a pinpoint urethral stricture that required dilation  with guidewire and then the use of Heyman dilators from 16 to 26 [place].  I was unable to I was then able to pass the cystoscope without  difficulty through the stricture, but he had a very high rigid prostate  and bladder neck region.  Inspection of the bladder revealed no stones,  tumors or inflammatory lesions and a perfectly benign looking bladder  mucosa.   Using the cystoscope and cone-tipped ureteral catheters, I performed  bilateral retrograde pyelograms.  The right ureter was delicate  and  without filling defects and the right pyelocaliceal system was normal.  The left pyelocaliceal system was normal and the upper ureter was normal  but the distal 2 or 3 cm of ureter on the left was irregular and  narrowed.  I am concerned about intraureteral lesion.  I passed a  Glidewire up the left ureter.  The ureteral orifice was too small to  pass the ureteroscope so I inserted the inner core of a ureteral access  sheath and it was tight about 3 to 4 cm above the left ureteral orifice.  I left the guidewire in place and then inserted a 6-French rigid  ureteroscope but because of the rigidity of the bladder neck.  I could  not negotiate beyond the ureteral orifice.  I then utilized the flexible  digital ureteroscope and passed it over the Glidewire but it would not  go up beyond the ureteral orifice.  I then used the access sheath for  the digital scope and was able to dilate the tight area in the ureter  again about 3 to 4 cm just proximal to the left ureteral orifice.  I  then had to pass the digital ureteroscope over the  Glidewire because I  did not want to risk losing access to the pyelocaliceal system and there  was impaired flow of irrigation fluid.  There was some bleeding in the  very distal ureter and I could not adequately visualize the mucosa.  I  felt it best to this time that I leave in a double-J catheter and repeat  a ureteroscopy in a week or two for now.  Therefore I put an open-ended  ureteral catheter over the guidewire to measure the ureteral length  which was 26 cm.  After back loading the  cystoscope, I inserted a 6-French 26 cm Contour double-J catheter with a  full coil in the renal pelvis and full coil in the bladder.  The bladder  was emptied, scope removed.  The patient sent to recovery room in good  condition having tolerated the procedure well.      Maretta Bees. [name], M.D.  Electronically Signed     LJP/MEDQ  D:  06/19/2008  T:  06/19/2008   Job:  [id number]   cc:   Nicki Guadalajara, M.D.  Fax: [phone]

## 2010-06-03 NOTE — Op Note (Signed)
NAMEOVAL, CAVAZOS             ACCOUNT NO.:  [id number]   MEDICAL RECORD NO.:  [id number]          PATIENT TYPE:  AMB   LOCATION:  [facility]                         FACILITY:  [facility]   PHYSICIAN:  Maretta Bees. [name], M.D.DATE OF BIRTH:  12/01/1940   DATE OF PROCEDURE:  DATE OF DISCHARGE:                               OPERATIVE REPORT   PREOPERATIVE DIAGNOSIS:  Rule out left ureteral carcinoma.   POSTOPERATIVE DIAGNOSIS:  Rule out left ureteral carcinoma.   PROCEDURE:  Cystoscopy, left retrograde pyelogram with interpretation,  left ureteroscopy, left ureteral biopsy and insertion of a left double-J  catheter.   SURGEON:  Dr. Larey Dresser.   ANESTHESIA:  General.   INDICATIONS:  This 70 year old gentleman has a history of bladder  carcinoma and during the course of dilating A urethral stricture and  evaluating him for urothelial carcinoma, a retrograde pyelogram showed  an irregular narrowed distal left ureter.  Earlier this month when that  was done, I left up a double-J catheter and he is brought back for  further evaluation.   PROCEDURE:  The patient was brought to the operating room and placed in  lithotomy position.  The external genitalia prepped and draped in the  usual fashion.  He was cystoscoped and I had to re-mildly dilate a  recurrent urethral stricture and he had a very fixed high-riding bladder  neck.  The bladder was unremarkable, except for a left double-J  catheter.  I pulled out the left double-J catheter with a grasper and  set up a metal guidewire into the left ureter.  I tried to manipulate  the digital ureteroscope up the guidewire, but the angulation from the  high bladder neck was too sharp.  Therefore, I inserted a ureteral  access sheath and inserted the digital ureteroscope.  I saw no  definitive papillary tumor, but there was erythema and edema.  Subsequently, I was able to use a grasper and obtain a piece of ureteral  mucosa for pathology.   The  left retrograde pyelogram showed some minimal fullness of the  calyces with some back flow, probably from the water flow pressure from  the ureteroscopy.  There were no filling defects seen in the upper  ureter.   I then used the guidewire to reinsert a 6-French 26 cm Contour double-J  catheter with a full coil in the renal pelvis and a full coil in the  bladder and a string brought out per urethra.  The bladder was emptied  and the scope removed.  The patient was sent to recovery room in good  condition having tolerated the procedure well.      Maretta Bees. [name], M.D.  Electronically Signed    LJP/MEDQ  D:  07/10/2008  T:  07/11/2008  Job:  [id number]

## 2010-06-03 NOTE — Op Note (Signed)
[name], [name]             ACCOUNT NO.:  [id number]   MEDICAL RECORD NO.:  [id number]          PATIENT TYPE:  AMB   LOCATION:  DAY                          FACILITY:  [facility]   PHYSICIAN:  Bertram Millard. Dahlstedt, M.D.DATE OF BIRTH:  Feb 04, 1940   DATE OF PROCEDURE:  04/11/2007  DATE OF DISCHARGE:                               OPERATIVE REPORT   PREOPERATIVE DIAGNOSIS:  1. Bladder tumor,.  2. History of a slightly abnormal right retrograde pyelogram.   POSTOPERATIVE DIAGNOSIS:  1. Bladder tumor.  2. Normal right retrograde pyelogram.   PRINCIPAL PROCEDURE:  1. Cystogram.  2. Right retrograde ureteropyelogram.  3. Transurethral resection of the bladder tumor 1 cm lesion.   SURGEON:  Bertram Millard. Dahlstedt, M.D.   ANESTHESIA:  General with LMA.   COMPLICATIONS:  None.   BRIEF HISTORY:  This gentleman came in about a week ago for persistent  gross painless hematuria.  He has had a recent cystoscopy and  retrograde.  This showed no evident bladder abnormalities, that was  actually in December.  Flexible cystoscopy, in the office, revealed a  left anterior lateral bladder tumor, towards the bladder neck.  Due to  his persistent hematuria, it was recommended that he undergo TURBT.  He  had retrogrades done at that time, and there was some question of  filling defects although this was more than likely air bubbles.  He  presents, at this time for a definitive procedure, to look at that right  system, and to get rid of his bladder tumor.  The risks and  complications have been discussed with the patient who desires to  proceed.   DESCRIPTION OF PROCEDURE:  The patient received preoperative IV  antibiotics.  He was identified in the holding area, and taken to the  operating room where general anesthetic was administered using the LMA.  He was placed in the dorsal lithotomy position.  Genitalia and perineum  were prepped and draped.   At this point in time, a time out was  called.  Proper patient, proper  procedure, proper site, surgeon, and other vital information was  discussed.  At this point, the procedure commenced.   A 22-French panendoscope was advanced under direct vision through his  urethra.  There was minimal narrowing of his urethra at the bulbous  urethra.  His prostate was not obstructed.  I then inspected the  bladder.  The prostate was quite fixed, and he did have an implant which  made a thorough inspection of the bladder was somewhat difficult;  however, both with the Foroblique and right-angle lenses, the bladder  tumor was seen on the left anterior sidewall near the bladder neck.  It  was very difficult, once this had been seen with the right angle lens,  to envision it with the Foroblique lens.  However, once this was done,  the biopsy forceps were then used to totally remove the bladder tumor  down to the muscular layer.  Two to three bites were taken to get rid of  this.   At this point there was some bleeding, and there was no evident  bladder  tumor tissue left.  I then removed the cold cup biopsy forceps, and  placed the Bugbee electrode through the bridge.  The bladder tumor  site/base was coagulated quite thoroughly.  Inspection revealed no other  bladder lesions.  The biopsy site was hemostatic.   At this point a retrograde ureteropyelogram was performed on the right,  using the 6-French open-ended catheter.  Normal ureter and renal pelvis  as well as calyceal system was seen.  There were no filling defects.   At this point the procedure was terminated.  The bladder was drained,  and the scope removed.  He was then awakened and taken to PACU in stable  condition.      Bertram Millard. Dahlstedt, M.D.  Electronically Signed     SMD/MEDQ  D:  04/11/2007  T:  04/11/2007  Job:  [id number]

## 2010-06-03 NOTE — Assessment & Plan Note (Signed)
[facility]                         GASTROENTEROLOGY OFFICE NOTE   NAME:[name], [name]                    MRN:          [id number]  DATE:06/09/2006                            DOB:          1940-04-06    PROBLEM:  Dysphagia.   Mr. Gabler has returned for scheduled followup.  He has a history of  an esophageal stricture for which he underwent endoscopy in April 2008.  Since his dilation, his dysphagia is significantly improved.  He has  only one episode of minimal dysphagia when he ate some meats.  Changes  were seen at the GE junction, characterized by an irregular GE junction.  Biopsies were positive for Barrett's esophagus.  No dysplasia was seen.  A biopsy of the small bowel were taken to rule out celiac sprue.  These  were entirely normal.  Mr. Conran has a history of iron deficiency  anemia.  A colonoscopy in October 2007 was normal.  Mr. Casale notes  that his stools are more solid than they have been in the past.  Altogether, he is feeling quite well.   EXAMINATION:  VITAL SIGNS:  Pulse 68, blood pressure 120/68, weight 201.   IMPRESSION:  1. Esophageal stricture - minimally symptomatic.  2. History of iron deficiency anemia.  Etiology for this is not clear.      There is no evidence for malabsorption by numbers or by small bowel      biopsy.  3. Barrett's esophagus.   RECOMMENDATIONS:  1. Continue Zegerid.  2. I carefully instructed Mr. Dines to contact me, should his      dysphagia worsen.  3. Follow-up hemoccults.  4. Follow-up upper endoscopy for Barrett's esophagus in approximately      one year.     Barbette Hair. Arlyce Dice, MD,FACG  Electronically Signed    RDK/MedQ  DD: 06/09/2006  DT: 06/09/2006  Job #: [id number]   cc:   Samul Dada, M.D.  Stacie Glaze, MD

## 2010-06-03 NOTE — Op Note (Signed)
NAMEPEARCE, LITTLEFIELD             ACCOUNT NO.:  [id number]   MEDICAL RECORD NO.:  [id number]          PATIENT TYPE:  AMB   LOCATION:  [facility]                         FACILITY:  [facility]   PHYSICIAN:  Maretta Bees. [name], M.D.DATE OF BIRTH:  1940/05/03   DATE OF PROCEDURE:  01/05/2007  DATE OF DISCHARGE:                               OPERATIVE REPORT   PREOPERATIVE DIAGNOSIS:  Urethral stricture and history of bladder  carcinoma.   POSTOPERATIVE DIAGNOSIS:  Urethral stricture and history of bladder  carcinoma.   PROCEDURE:  Cystoscopy, urethral dilation, bilateral retrograde  pyelograms with interpretation and cold cup bladder biopsies.   SURGEON:  Dr. Larey Dresser   ANESTHESIA:  General.   INDICATIONS:  This 70 year old gentleman has had a long history of low  grade and noninvasive bladder carcinomas going back to 1995, 1996 and  2002. He had a higher great tumor in 2003 that was noninvasive then he  was treated with BCG.  The he had been there previously treated in the  1990s with BCG.  He also has a known bulbous urethral stricture that I  could not negotiate in the office last week and he is brought in for  further evaluation treatment.   PROCEDURE:  The patient brought to the operating room and placed  lithotomy position.  External genitalia were prepped and draped in usual  fashion.  He was cystoscoped and he had a bulbous urethral stricture  that I needed to then dilate to 26-French.  I then was able to insert  the cystoscope, but he had a very rigid prostate and a high bladder neck  complicated in part by his penile implant.  The bladder had no papillary  tumors or stones and there was just a couple mildly erythematous areas.   Bilateral retrograde pyelograms were obtained through the cystoscope  utilizing a cone-tipped ureteral catheter and both pyelocaliceal systems  and ureters were delicate with no obstruction and no filling defects.  There were a couple of air bubble  some study on the right but they were  obviously air bubbles as they moved upward into in the pyelocaliceal  system from the ureter.   I then performed cold cup bladder biopsies on the bladder base and  fulgurated the biopsy sites with the Bugbee electrode.  The bladder was  emptied, scope removed.  He had good hemostasis and tolerated the  procedure well.      Maretta Bees. [name], M.D.  Electronically Signed     LJP/MEDQ  D:  01/05/2007  T:  01/05/2007  Job:  [id number]   cc:   Darlin Priestly, MD  Fax: [phone]

## 2010-06-06 NOTE — Cardiovascular Report (Signed)
NAME:  [name], BIAS                       ACCOUNT NO.:  [id number]   MEDICAL RECORD NO.:  [id number]                   PATIENT TYPE:  INP   LOCATION:  3713                                 FACILITY:  [facility]   PHYSICIAN:  Darlin Priestly, M.D.             DATE OF BIRTH:  [birth year]   DATE OF PROCEDURE:  04/10/2002  DATE OF DISCHARGE:                              CARDIAC CATHETERIZATION   PROCEDURE:  1. Left heart catheterization.  2. Coronary angiography.  3. Left ventriculogram.  4. Saphenous vein graft angiography.  5. Left internal mammary artery angiography.   CARDIOLOGIST:  Darlin Priestly, M.D.   COMPLICATIONS:  None.   INDICATIONS FOR PROCEDURE:  The patient is a 70 year old black male patient  of Dr. [name] with history of CAD status post coronary artery bypass  surgery consisting of LIMA to LAD, vein graft to OM, vein graft to PDA.  The  patient recently complained of increasing chest pain and is no for repeat  cardiac catheterization to assess coronary status.   DESCRIPTION OF PROCEDURE:  After giving informed consent, the patient was  brought to the cardiac catheterization lab where the left groin was shaved,  prepped, and draped in the usual sterile fashion.  ECG monitoring was  established.  Using modified Seldinger technique, a 6-French arterial sheath  was inserted in the left femoral artery.  The 6-French diagnostic catheter  was used to perform diagnostic angiography.   This reveals a medium size left main with mild 60% ostial lesion.   The LAD is a medium size that gives rise to two diagonal branches.  There is  retrograde filling of the LIMA at its insertion in the mid portion of the  LAD.  The first and second diagonal are medium size vessels with no  significant disease.   The left internal mammary artery at insertion of the mid portion of the LAD  has no high-grade stenosis.  There is no significant disease beyond the  insertion.   The  left circumflex is a medium size vessel which courses in the A-V grove  and gives rise to two large obtuse marginal branches.  The A-V groove  circumflex has a 60% mid vessel lesion extending across the first obtuse  marginal .  This is a medium size vessel.  More distal in the A-V groove is  a small 80% stenotic lesion just prior to the takeoff of the second OM.  The  first OM is a large vessel which fills in an antegrade fashion.  There is a  saphenous vein graft to the upper bifurcation of the obtuse marginal which  is widely patent.  The second obtuse marginal is a small vessel which  bifurcates in the mid segment and has no significant disease.   The right coronary artery is a large vessel which is dominant and gives rise  to a PDA as well as a posterolateral branch.  There is diffuse disease of  the proximal and mid RCA up to 70%.  There is a 90 to 95% distal RCA lesion  prior to the bifurcation of the PDA and posterolateral branches.   Saphenous vein graft to the PDA is widely patent.  This does actually fill  the distal RCA through posterolateral branch.   Left ventriculogram reveals mildly depressed EF of 35 to 40% with mild  global hypokinesis. There is mild mitral regurgitation.   HEMODYNAMICS:  Systemic arterial pressure 145/72, left ventricular pressure  142/80, LV EDP 21.   CONCLUSION:  1. Significant left main and two-vessel coronary artery disease.  2. Patent left internal mammary artery to left anterior descending artery.  3. Patent saphenous vein graft to obtuse marginal.  4. Patent saphenous vein graft to posterior descending artery.  5. Mildly depressed left ventricular systolic function with wall motion     abnormalities as noted above.  6. Mild mitral regurgitation.  7. Elevated left ventricular end-diastolic pressure.                                               Darlin Priestly, M.D.    RHM/MEDQ  D:  04/10/2002  T:  04/11/2002  Job:  [id number]   cc:    Nicki Guadalajara, M.D.  [phone], [address]  [place], [place] [address]  Fax: [phone]

## 2010-06-06 NOTE — Discharge Summary (Signed)
NAME:  [name], [name] NO.:  [id number]   MEDICAL RECORD NO.:  [id number]                   PATIENT TYPE:  INP   LOCATION:  3713                                 FACILITY:  [facility]   PHYSICIAN:  [name], M.D.                  DATE OF BIRTH:  [birth year]   DATE OF ADMISSION:  04/07/2002  DATE OF DISCHARGE:  04/11/2002                                 DISCHARGE SUMMARY   ADMISSION DIAGNOSES:  1. Unstable angina.  2. Anemia.  3. Normal left ventricular function.  4. Know coronary artery disease with history of coronary artery bypass     grafting in March 2002.  5. Hyperlipidemia.  6. Hypertension.  7. Recent esophagogastroduodenoscopy and colonoscopy by Dr. Arlyce Hughes with     recent esophageal dilatation.  8. History of bladder cancer followed by Dr. Vonita Hughes status post completed     chemotherapy course.   DISCHARGE DIAGNOSES:  1. Unstable angina status post cardiac catheterization by [name],     M.D., revealing patent bypass grafts.  Planned continued medical therapy     for coronary artery disease.  2. Anemia.     a. Status post gastrointestinal consultation.     b. Status post hematology consultation with subsequent bone marrow        biopsy.     c. Iron deficiency.  3. Normal left ventricular function.  4. Know coronary artery disease with history of coronary artery bypass     grafting in March 2002.  5. Hyperlipidemia.  6. Hypertension.  7. Recent esophagogastroduodenoscopy and colonoscopy by Dr. Arlyce Hughes with     recent esophageal dilatation.  8. History of bladder cancer followed by Dr. Vonita Hughes status post completed     chemotherapy course.   HISTORY OF PRESENT ILLNESS:  The patient is a 70 year old married white male  who presented to the office for routine evaluation on 03/28/2002.  At that  time, he was hypertensive with recurrent episodes of shortness of breath as  well as episodes of chest pain.  He has a known history of  CAD status post  CABG March 2002.  Because of recurrent chest pain, he had undergone repeat  catheterization in July 2002 which showed patent grafts.  LV function near  65%.   At the office on 03/28/2002, he was having continued difficulty with shortness  of breath.  He denied any wheezing.  However, he had not been very active  because he got so short of breath which has led to further deconditioning.   When he saw Dr. Tresa Hughes on 03/28/2002, it was noted that he had recently  undergone colonoscopy as well as endoscopy by Dr. Arlyce Hughes. As well, he had  undergone esophageal dilatation.  He denied any change in bowel or bladder  habits.  Denied any stool discoloration.  He had completed chemotherapy for  bladder cancer by Dr. Vonita Hughes.  At that  time, Dr. Tresa Hughes planned to check  laboratories including a CBC, CMET, fasting lipid profile, liver function  tests, and pro time.  Recommendation, in light of recurrent increasing  shortness of breath and episodes of chest pain requiring nitroglycerin use,  was that definitive diagnostic cardiac catheterization be performed.  Risks  and benefits of the procedure were discussed with the patient, and he was  willing to proceed.  Will check labs in the following week and they were  also given guaiac cards to check for hemoccult stools.  It was felt he could  possibly have progressive anemia leading to masking of CAD with dyspnea and  angina symptomatology.   Preprocedure labs were checked on 04/05/2002 which revealed a hemoglobin of  8.5, hematocrit 27.0.  These were reviewed, and it was felt he needed  admission for transfusion and repeat GI evaluation.   HOSPITAL COURSE:  On 04/07/2002, the patient was admitted.  At that time he  was planned for type and cross, transfused 2 units packed red blood cells,  and planned GI consultation.  On 04/07/2002, the patient was seen in GI  consultation by Dr. Marina Hughes.  At that time, it was felt the patient had  microcytic  anemia which was heme negative with recent GI evaluation,  unrevealing for any source of chronic loss.  Continue Nexium, follow  hemoccult, and plan hematology consult.  Planned for iron supplementation.  It was felt that, if the heme workup was negative and the stools were  positive, then he may need a capsule endoscopy as an outpatient.   If the heme was negative, then we could refer back to Dr. Arlyce Hughes for EGD  with biopsy to rule out sprue as well as the capsule endoscopy.  It was felt  that if the blood count normalized on iron, then we could keep him on iron  indefinitely and check hemoglobin and hematocrit periodically.   On 04/10/2002, the patient underwent cardiac catheterization by Dr. Lenise Hughes.  He was found to have patent bypass grafts.  He did have some  significant native vessel circumflex disease,but it was a very small vessel  and not amenable to intervention.  Planned to continue medical therapy for  CAD.  Ejection fraction was 35 to 40% with mild global hypokinesis and mild  MR.  At that time, we planned to obtain hematology consultation.   On 04/10/2002, the patient was seen in hematology consultation by Dr.  Truett Hughes.  At that point, it was felt the basis for the microcytic anemia  was not clear.  GI workup was negative.  They planned to proceed with a bone  marrow exam the following morning.   On 04/11/2002, the patient underwent bone marrow aspirate and biopsy by  hematology.  The procedure was thought from hematology standpoint the  patient was stable for discharge home if okay with cardiology.  At that  time, the patient had been seen and evaluated by Dr. Susa Hughes who  had deemed him stable for discharge home from a cardiac standpoint.   At the time of discharge, his heart rate was 75, blood pressure 125/65.  Labs were all stable.  Iron studies do show iron deficiency.  Groin sites well healed post catheterization procedure.  He is deemed stable  for  discharge home.   CONSULTATIONS:  1. GI consultation by [name]. [name], M.D., on 04/07/2002 for evaluation of     anemia.  2. Hematology consultation by Leighton Roach. [name], M.D., on 04/10/2002  for     evaluation of anemia.   PROCEDURES:  1. Cardiac catheterization on 04/10/2002 by [name], M.D.  This     revealed patent coronary artery bypass grafts.  There was no significant     disease after the graft anastomosis sites.  However, the native left     circumflex did have significant disease, but this was a very small vessel     and not amenable to pecutaneous coronary intervention.  The patient had     EF 35 to 40% with mild global hypokinesis, mild MR.  The patient     tolerated the procedure well, and there were no complication.  2. Bone marrow biopsy performed 04/11/2002 by Lonna Cobb, Nurse     Practitioner.  Results and findings are pending at the time of discharge     home, and the patient is to follow up in the office for the results     tomorrow.   LABORATORY DATA:  Iron studies showed total iron 12, TIBC 341, percent  saturation 4, B12 of 317.  Cardiac enzymes negative with CK 59, MB 1.2,  troponin 0.01.  Sodium 139, potassium 4.0, chloride 103, CO2 31, glucose  167, BUN 20, creatinine 1.1.  PT 14.9, INR 1.2, PTT 27, white count 4.8,  hemoglobin 8.1, hematocrit 25.7, and platelets 160 at time of admission on  04/07/2002.  Post transfusion on 04/08/2002, hemoglobin was up to 10.2,  hematocrit 30.3.  The next day on 04/09/2002, hemoglobin 11.2, hematocrit  34.7.  By time of discharge home on 04/11/2002, hemoglobin 10.9, hematocrit  33.1.  At the time of discharge, sodium 142, potassium 4.2, BUN 14,  creatinine 0.9, glucose 120.   Chest x-ray 04/10/2002 shows left basilar scar atelectasis.   EKG showed sinus bradycardia at 59 beats per minute, nonspecific ST-T  changes, no significant new changes.   DISCHARGE MEDICATIONS:  1. Isosorbide 60 mg once a day.  2. Altace  10 mg once a day.  3. Toprol XL 100 mg 1/2 tablet a day.  4. Nexium 40 mg once a day.  5. Vioxx 25 mg once a day.  6. Lexapro 10 mg once a day.  7. Allegra 180 mg once a day.  8. OxyContin 10 mg twice a day.  9. Folic acid 400 mg once a day.  10.      Klor-Con 10 mEq once a day.  11.      Furosemide 20 mg once a day.  12.      Lipitor 40 mg once a day.  13.      Aspirin 325 mg once a day.  14.      Vitamin E.  15.      Niaspan 500 mg each night.  16.      Advair 500/20 one each night.  17.      Ferrous sulfate 325 mg 3 times a day.   ACTIVITY:  No lifting greater than 5 pounds.  No driving or sexual activity  for 3 days.   DIET:  Low-fat, low-cholesterol diet.   FOLLOW UP:  Call [phone] for followup.   He has a followup appointment with Dr. Tresa Hughes on April 13 at 2 o'clock p.m.  He is going to follow up in the office upcoming Friday to fill out some  disability forms.  Per hematology: Take bandage off on the morning of 04/12/2002 and may shower.  Call them if there is any increased pain or bleeding from the  bone marrow  site at [phone].   Follow up at [facility] with Dr.  Filbert Berthold.  Apparently they have already called the patient this afternoon with  an appointment time for them to follow up in the office with bone marrow  results tomorrow.       Mary B. Easley, P.A.-C.                   [name], M.D.    MBE/MEDQ  D:  04/11/2002  T:  04/12/2002  Job:  [id number]   cc:   Jillyn Hidden B. [name], M.D.  [address] [facility]  [place]  [place]  [address]  Fax: [phone]   Enzo Montgomery. Filbert Berthold, M.D.  [address] [place] [address]  Fax: [phone]   [name]. [name], M.D. [facility]   Molly Maduro D. [name], M.D. [facility]

## 2010-06-06 NOTE — Op Note (Signed)
NAME:  [name], [name]                       ACCOUNT NO.:  [id number]   MEDICAL RECORD NO.:  [id number]                   PATIENT TYPE:  AMB   LOCATION:  [facility]                                 FACILITY:  [facility]   PHYSICIAN:  Cristi Loron, M.D.            DATE OF BIRTH:  09/07/1940   DATE OF PROCEDURE:  04/05/2003  DATE OF DISCHARGE:                                 OPERATIVE REPORT   PREOPERATIVE DIAGNOSIS:  Recurrent left L4-5 herniated nucleus pulposus,  spinal stenosis, lumbar radiculopathy with lumbago.   POSTOPERATIVE DIAGNOSIS:  Recurrent left L4-5 herniated nucleus pulposus,  spinal stenosis, lumbar radiculopathy with lumbago.   OPERATION PERFORMED:  Re-do left L4-5 microdiskectomy using microdissection.   SURGEON:  Cristi Loron, M.D.   ASSISTANT:  None.   ANESTHESIA:  General endotracheal.   ESTIMATED BLOOD LOSS:  50 mL.   SPECIMENS:  None.   DRAINS:  None.   COMPLICATIONS:  None.   INDICATIONS FOR PROCEDURE:  The patient is a 70 year old white male on whom  I performed a left L4-5 microdiskectomy about four months ago for herniated  disk.  He did very well initially but then a couple of weeks ago developed  severe recurrent left leg pain.  The patient has failed medical management  and was worked up with a lumbar MRI which demonstrated a large recurrent  herniated disk at L4-5 on the left.  I dicussed the various treatment  options with the patient including surgery.  The patient weighed the risks,  benefits and alternatives to surgery and decided to proceed with a re-do  microdiskectomy.   DESCRIPTION OF PROCEDURE:  The patient was brought to the operating room by  the anesthesia team.  General endotracheal anesthesia was induced.  The  patient was then turned to the prone position on the Wilson frame.  His  lumbosacral region was then shaved and prepared with Betadine scrub and  Betadine solution and sterile drapes were applied.  I then injected the  area  to be incised with Marcaine with epinephrine solution.  I used a scalpel to  incise through his previous lumbar incision.  I used electrocautery to  dissect down to the thoracolumbar fascia, divided the fascia just to the  left of the midline performing a left-sided subperiosteal dissection  stripping the paraspinous musculature from the left spinous process and  lamina of L4 and L5.  I inserted the McCullough retractor for exposure and  then obtained intraoperative radiograph to confirm our location.  We then  brought the operating microscope into the field and under its magnification  and illumination, completed the microdissection.  I used a high speed drill  to drill away some of the epidural fibrosis and to widen the patient's  previous L4 laminotomy.  I drilled off some of the more cephalad L4 lamina  until I encountered some relatively nonscarred down dura.  I then used  microdissection to free up  the dura from the epidural fibrosis and then  removed the epidural fibrosis and then exposed the left L5 nerve root.  I  used microdissection to free up from the scar tissue and the underlying  intervertebral disk.  When I did, I exposed a large recurrent herniated disk  which was in multiple fragments.  I removed the herniated disk with  pituitary forceps.  There was a fairly large hole in the annulus fibrosis.  I widened the hole somewhat with the 15 blade scalpel and then performed a  partial intervertebral diskectomy using the pituitary forceps and Epstein  and Carlens curets.  I then used the osteophyte tool to remove some  redundant ligament from the vertebral end plates at Z6-1.  I then palpated  along the ventral surface of the thecal sac and along the exit route of the  left L5 nerve root and it was well decompressed throughout the neural  foramen.  I obtained stringent hemostasis using bipolar electrocautery.  I  copiously irrigated the wound out with bacitracin, removed the  solution, and  then removed the [facility] retractor.  I then reapproximated the patient's  thoracolumbar fascia with interrupted #1 Vicryl sutures, subcutaneous tissue  with interrupted 2-0 Vicryl suture and the skin with Bioglue as the patient  is allergic to tape.  The drapes were removed.  The patient was subsequently  returned to supine position where he was extubated by the anesthesia team.  All sponge, needle and instrument counts were correct at the end of the  case.                                               Cristi Loron, M.D.    JDJ/MEDQ  D:  04/06/2003  T:  04/09/2003  Job:  [id number]

## 2010-06-06 NOTE — Op Note (Signed)
NAME:  [name], [name]                       ACCOUNT NO.:  [id number]   MEDICAL RECORD NO.:  [id number]                   PATIENT TYPE:  INP   LOCATION:  3008                                 FACILITY:  [facility]   PHYSICIAN:  Cristi Loron, M.D.            DATE OF BIRTH:  1940/10/14   DATE OF PROCEDURE:  04/05/2003  DATE OF DISCHARGE:  04/06/2003                                 OPERATIVE REPORT   ADDENDUM:  I forgot to mention that when I was done with the redo  microdiscectomy, I inspected the thecal sac and nerve root and noted there  was some thinning of the thecal sac, I did not see any spinal fluid but I  thought it prudent to place some BioGlue in the epidural space.                                               Cristi Loron, M.D.    JDJ/MEDQ  D:  04/09/2003  T:  04/10/2003  Job:  [id number]

## 2010-06-06 NOTE — Op Note (Signed)
NAMESERGE, MAIN             ACCOUNT NO.:  [id number]   MEDICAL RECORD NO.:  [id number]          PATIENT TYPE:  OIB   LOCATION:  2550                         FACILITY:  [facility]   PHYSICIAN:  Suzanna Obey, M.D.       DATE OF BIRTH:  May 25, 1940   DATE OF PROCEDURE:  06/06/2004  DATE OF DISCHARGE:                                 OPERATIVE REPORT   PREOPERATIVE DIAGNOSIS:  Chronic sinusitis, deviated septum, and turbinate  hypertrophy.   POSTOPERATIVE DIAGNOSIS:  Chronic sinusitis, deviated septum, and turbinate  hypertrophy.   SURGICAL PROCEDURE:  Bilateral maxillary antrostomy, bilateral  ethmoidectomy, bilateral frontal sinusotomy, Stealth guidance system,  septoplasty, and submucous resection of inferior turbinates.   ANESTHESIA:  General endotracheal tube.   ESTIMATED BLOOD LOSS:  Less than 25 cc.   INDICATIONS:  This is a 70 year old who has had a chronic sinusitis that has  been refractory to medical therapy.  He has pulmonary problems that may be  exacerbated by this chronic sinus issue.  He certainly had adequate medical  therapy.  He also has sleep apnea and has nasal obstruction preventing him  from tolerating CPAP very well.  He was informed of the risks and benefits  of the procedure, including bleeding, infection, perforation of the septum,  chronic crusting and drying, change in external appearance of the nose,  numbness of the teeth, CSF leak, change in the sense of smell, blindness.  scarring of the sinuses, and risk of the anesthetic.  He understands that  this operation will unlikely fix his obstructive sleep apnea and he will  still need to wear CPAP.  All his questions were answered, and consent was  obtained.   OPERATION:  The patient was taken to the operating room and placed in the  supine position.  After adequate general endotracheal tube anesthesia, he  was prepped and draped in the usual sterile manner, and the Stealth  [employer] helmet was  positioned and calibrated.  Oxymetazoline pledgets  were placed into the nose bilaterally, and then the septum and inferior and  middle turbinates were injected with 1% lidocaine with 1:100,000.  A left  hemitransfixion incision was performed, raising a mucoperichondrial and  osteal flap.  The cartilage was divided about 1 cm posterior to the caudal  strut, and the posterior cartilage was removed.  The Jansen-Middleton  forceps were then used to remove the deviated portion of the bone, and the  inferior spur was removed with a 4 mm osteotome.  The septum was also  deviated into the right vestibule anteriorly, and this was freed up on both  sides of the septum and a pocket created between the medial crura.  This  allowed the septum to swing back into midline.  It was sutured in position  with a quilting 4-0 plain gut stitch and 4-0 chromic to close the skin.  The  quilting stitch was placed to the septum.  The turbinates were then  infractured.  Midline incision was made with a 15 blade.  Mucosa flap  elevated superiorly, and the inferior mucosa and bone were removed with a  turbinate scissors.  The edges were cauterized with suction cautery.  The  flaps were laid back down, and both turbinates were outfractured.  The  sinuses were then addressed.  Using the Stealth guidance system, the  antrostomy was opened with a retrograde fascia, removing the uncinate  process on the left aide, and antrostomy was opened widely.  There were  polyps within the middle meatus area which were removed, and the ethmoids  were opened from posterior to anterior using the Stealth guidance.  There  was a lot of polyp material up in the frontal ducts, which were opened, and  the 40-degree microdebrider with guidance was used to open up the  nasofrontal duct nicely.  The polyp was removed from this nasofrontal duct  region.  I could see clearly into the frontal sinus.  A pledget was placed,  and the right side was  repeated in the same fashion.  Again, antrostomy  opened, ethmoids opened, and polyps throughout the nasofrontal duct region  also opened with a microdebrider, and using Stealth for all the positions of  the sinus.  The sinuses were opened, and good hemostasis with oxymetazoline  pledgets.  The right side had a concha bullosa that had to be trimmed on the  lateral aspect to open up the middle meatus area nicely.  The Kennedy packs  were then placed into both ethmoids and soaked with bacitracin and injected  with saline.  The rolled Telfa was soaked in bacitracin and placed into the  nose bilaterally and secured with a 3-0 nylon.  The nasopharynx had been  suctioned out of all blood and debris, and the oral cavity and oropharynx  was suctioned out of all blood and debris under direct visualization.  The  patient was then awakened and brought to recovery in stable condition.  Counts correct.      JB/MEDQ  D:  06/06/2004  T:  06/06/2004  Job:  [id number]   cc:   Stacie Glaze, M.D. [facility]

## 2010-06-06 NOTE — Consult Note (Signed)
NAME:  [name], [name]                       ACCOUNT NO.:  [id number]   MEDICAL RECORD NO.:  [id number]                   PATIENT TYPE:  INP   LOCATION:  3713                                 FACILITY:  [facility]   PHYSICIAN:  Lowell C. Catha Gosselin, M.D.               DATE OF BIRTH:  04-26-1940   DATE OF CONSULTATION:  04/10/2002  DATE OF DISCHARGE:                                   CONSULTATION   SUMMARY:  The patient is a 70 year old male patient seen for anemia.   Past medical history is significant for a history of bladder cancer, history  of coronary artery disease status post coronary artery bypass grafting in  March 2002.  He has had a history of gastroesophageal reflux disease and  esophageal stricture status post dilatation October 2003.  He has had a  history of hyperlipidemia and anemia.  He was admitted on April 07, 2002  with shortness of breath and chest pain.  On admission his hemoglobin was  8.1, MCV 73, white count 4.8, platelet count 160,000.  A normal PT and PTT.  He had a serum iron level of 12.  TIBC level of 341.  Percent saturation of  4%.  B12 of 317.  He was given two units of packed red blood cells on April 07, 2002 with hemoglobin up to 10.2 post transfusion on April 08, 2002.  GI  workup in the past, October 2003, showed an EGD with gastritis and  esophageal stricture.  Colonoscopy in October 2002 showed a polyp in the  sigmoid colon, hyperplastic.  No evidence of malignancy, [id number].  He was  started on ferrous sulfate 325 mg t.i.d. on April 07, 2002.   PAST MEDICAL HISTORY:  His past medical history is significant for bladder  cancer.  Initial diagnosis made 10 to 12 years ago, recurrent in August  1995.  He has had recurrences in December 1996, July 2002, and December  2003.  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  He has had  noninvasive disease in the past.  He has had intravesical chemotherapy in  December 2003 and February 2004.  He has  had a cystoscopy that was negative  in March 2004.  Past medical history otherwise is as above.  He is on  extensive home medications including aspirin, Lipitor, Lexapro, Nexium,  ferrous sulfate, Advair, folic acid, Lasix, Imdur, Claritin, Niacin,  OxyContin, Theo-Dur, Altace, Vioxx, and vitamin E.   ALLERGIES:  ZOCOR.   FAMILY HISTORY:  Positive for melanoma in his mother.   SOCIAL HISTORY:  Lives in [place].  No alcohol use.  He has three  children, all healthy.  He is disabled.  Previously worked in [employer].  Quit tobacco quite some time ago, 25 years ago after smoking 1/2 pack a day  for 15 years.   REVIEW OF SYMPTOMS:  Having some night sweats.  No fever.  Eats well.  He  has a balanced diet.   PHYSICAL EXAMINATION:  VITAL SIGNS:  Temperature 96.3, pulse 64,  respirations 16, blood pressure 110/64.  Room air O2 saturation 97%.  HEENT:  Normocephalic and atraumatic.  Pupils equal, round, regular and  react to light.  Extraocular muscles are intact.  Sclerae are anicteric.  Oral pharynx is clear.  There is no palpable cervical, supraclavicular or  axillary lymph nodes.  CHEST:  Lungs are clear anteriorly.  HEART:  Regular rate and rhythm.  ABDOMEN:  Soft, nontender.  EXTREMITIES:  No clubbing, cyanosis or edema.  NEUROLOGIC:  He is alert and oriented.   LABORATORY DATA:  White count 4.9, hemoglobin 10.9, platelet count 158,000  again post transfusion, MCV of 74.2.  RBC and folate level pending.  Sodium  139, potassium 4, creatinine 1.1, BUN 20.  Stool was hemoccult negative.  Of  note his hemoglobin in December 2003 was 9. In November 2002 his hemoglobin  was 11.3.   ASSESSMENT:  A 70 year old male patient with recurrent bladder carcinoma  status post intravesical chemotherapy with a recent negative cystoscopy. He  now; however, is anemic with microcystic indices but it is unclear why.  He  is status post transfusion.  He has had a fairly recent colonoscopy and   esophagogastroduodenoscopy that were negative for any evidence of  gastrointestinal source of his bleeding.   PLAN:  Since there is no obvious basis for his microcytic anemia and he has  been transfused will go ahead and proceed with a bone marrow exam at 10:30  am on April 11, 2002 to look at his morphology and iron stores.  I have  discussed that with the family.  They understand the risk for bleeding and  infection is quite low.  He does desire to go ahead and proceed.  We will  get the results in a couple of days.  If he can otherwise go home after his  bone marrow exam from a cardiac standpoint, we could follow him up as an  outpatient to go over the results.                                               Lowell C. Catha Gosselin, M.D.    LCS/MEDQ  D:  04/10/2002  T:  04/11/2002  Job:  [id number]   cc:   Nicki Guadalajara, M.D.  [phone] [address]  [place], [place] [address]  Fax: [phone]   New patient [facility] [place]

## 2010-06-06 NOTE — Op Note (Signed)
NAME:  [name], [name] NO.:  [id number]   MEDICAL RECORD NO.:  [id number]                   PATIENT TYPE:  INP   LOCATION:  3105                                 FACILITY:  [facility]   PHYSICIAN:  Cristi Loron, M.D.            DATE OF BIRTH:  08-09-1940   DATE OF PROCEDURE:  01/01/2003  DATE OF DISCHARGE:                                 OPERATIVE REPORT   BRIEF HISTORY:  The patient is a 70 year old white male, who has suffered  from back and left leg pain.  He failed medical management and a lumbar MRI  demonstrated disk at L4-5 on the left.  The patient's symptoms were  consistent with a left L5 radiculopathy.  I discussed the various treatment  options with the patient, including surgery.  The patient weighed the risks,  benefits and alternatives to surgery and has decided to proceed with a  microdiskectomy.   PREOPERATIVE DIAGNOSES:  1. Left L4-5 herniated nucleus pulposus.  2. Degenerative disk disease.  3. Chemosis.  4. Lumbar radiculopathy and lumbago.   POSTOPERATIVE DIAGNOSES:  1. Left L4-5 herniated nucleus pulposus.  2. Degenerative disk disease.  3. Chemosis.  4. Lumbar radiculopathy and lumbago.   PROCEDURE:  Left L4-5 microdiskectomy, using microdissection.   SURGEON:  Cristi Loron, M.D.   ASSISTANT:  Hilda Lias, M.D.   ANESTHESIA:  General endotracheal.   ESTIMATED BLOOD LOSS:  Minimal.   SPECIMENS:  None.   DRAINS:  None.   COMPLICATIONS:  None.   DESCRIPTION OF PROCEDURE:  The patient was brought to the operating room by  anesthesia team.  General endotracheal anesthesia was induced.  The patient  was then turned to the prone position on a Wilson frame.  His lumbosacral  region was then prepared with Betadine scrub and Betadine solution.  Sterile  drapes were applied.  I then injected the area to be incised with Marcaine  with epinephrine solution, and used a scalpel to make an incision over the  L4-5  interspace.  I used electrocautery to dissect down to thoracolumbar  fascia.  I undermined the fascia just to the left of midline, performing a  left-sided subperiosteal dissection, stripped the paraspinous musculature  from the left spinous process and the lamina of L4 and L5.  I inserted the  McCullough retractor for exposure, and then obtained intraoperative  radiograph to confirm our location.   I then brought the operative microscope into the field and under its  magnification and illumination to complete microdissection.  I used a high-  speed drill to perform a left L4 laminotomy.  I widened the laminotomy with  the Kerrison punch, and removed the left L4-5 ligament of Flavum.  I  performed a foraminotomy about the left L5 nerve root.  I then used  microdissection to free up the thecal sac and left L5 nerve root from the  epidural tissue.  Dr. Jeral Fruit gently retracted  the nerve root medially with  the Draco retractor, exposing a moderate-sized underlying herniated disk.  I  incised into the herniated disk with 15-blade scalpel, and performed a  partial intervertebral diskectomy with the Epstein and Scoville curets and  the pituitary forceps.  I then used the osteophyte tool to remove some  redundant ligament from the vertebral endplate at L4-5.  I then, halfway  along the retro surface of the thecal sac and along the exit route of left  L5 nerve root; noted the neural surfaces were well decompressed.   I obtained hemostasis using bipolar electrocautery.  Copiously irrigated  with Bacitracin solution.  I removed the solution and then removed the  [facility] retractor.  I then reapproximated the patient's thoracolumbar  fascia with interrupted #1 Vicryl suture.  Subcutaneous was reapproximated  with interrupted 2-0 Vicryl suture, and the skin with Steri-Strips and  Benzoin.  The wound was then covered with Bacitracin ointment.  Sterile  dressings applied.  The drapes were removed and  the patient was subsequently  returned to the supine position, where he was extubated by anesthesia team  and transported to the post-anesthesia care unit in stable condition.  All  sponge, needle and instrument counts were correct at the end of this case.                                               Cristi Loron, M.D.    JDJ/MEDQ  D:  01/01/2003  T:  01/02/2003  Job:  [id number]

## 2010-06-06 NOTE — Op Note (Signed)
[name], [name]                         ACCOUNT NO.:  [id number]   MEDICAL RECORD NO.:  [id number]                   PATIENT TYPE:   LOCATION:                                       FACILITY:   PHYSICIAN:  Maretta Bees. [name], M.D.             DATE OF BIRTH:   DATE OF PROCEDURE:  05/07/2003  DATE OF DISCHARGE:                                 OPERATIVE REPORT   PREOPERATIVE DIAGNOSIS:  Recurrent bladder carcinoma and urethral stricture.   POSTOPERATIVE DIAGNOSIS:  Recurrent bladder carcinoma and urethral  stricture.   OPERATION/PROCEDURE:  1. Cystoscopy  2. Urethral dilation.  3. Cold cup resection of a bladder tumor.  4. Right retrograde pyelogram with interpretation.   SURGEON:  Maretta Bees. [name], M.D.   ANESTHESIA:  General.   INDICATIONS:  This 70 year old white male is found to have recurrent bladder  carcinoma over the right intramural ureter on surveillance cystoscopy.  He  is brought to the OR today for treatment of that.  He has a history of  bulbous urethral stricture.   DESCRIPTION OF PROCEDURE:  The patient is brought to the operating room,  placed in the lithotomy position.  External genitalia are prepped and draped  in the usual fashion.  He was cystoscoped after dilating bulbous urethral  stricture to 26-French.  He had a high bladder neck but otherwise no  obstruction.  He had mild trabeculation.  The only lesion was a papillary  tumor with some surrounding erythema over the right intramural ureter,  measuring about 1 cm in size.  The cold cup bladder biopsy forceps was used  to resect this papillary tissue and send it to pathology.  The biopsy site  and surrounding erythematous mucosa were fulgurated with the Bugbee  electrode.  A right retrograde pyelogram with the cone-tip catheter was  performed in the right ureter and pyelocalyceal system was perfectly normal.  There were no filling defects.  Bladder was emptied.  The scope was removed.  The patient  was sent to the recovery room in good condition having tolerated  the procedure well.                                               Maretta Bees. [name], M.D.    LJP/MEDQ  D:  05/07/2003  T:  05/07/2003  Job:  [id number]

## 2010-06-06 NOTE — Discharge Summary (Signed)
[place]. [facility]  Patient:    [name], [name]                    MRN: [id number] Adm. Date:  [id number] Disc. Date: [id number] Attending:  Charlett Lango Dictator:   Loura Pardon, P.A. CC:         Stacie Glaze, M.D. [place]  Lennette Bihari, M.D.   Discharge Summary  DATE OF BIRTH:  Feb 05, 1940  CARDIOLOGIST:  Nicki Guadalajara, M.D.  PRIMARY CARE PHYSICIAN:  Darryll Capers, M.D.  DISCHARGE DIAGNOSES: 1. Left main coronary artery disease with class III progressive angina. 2. Postoperative acute blood loss anemia not received transfusion but iron    supplementation only. 3. Right shoulder stiffness most likely due to intraoperative stretching    injury.  SECONDARY DIAGNOSES: 1. Hyperlipidemia. 2. History of bladder cancer. 3. Peyronies disease, status post implant. 4. Degenerative disk disease of the thoracic spine. 5. Gastroesophageal reflux disease. 6. Fibromyalgia. 7. Positive stress test with ischemic changes in the inferolateral wall and    apex.  PROCEDURES: 1. Left heart catheterization, March 24, 2000.  Left heart catheterization,    left ventriculogram, and coronary angiography, Dr. Nicki Guadalajara.  Study    demonstrated significant calcification involving the left main coronary    artery as well as the right coronary system.  Left main coronary artery had    an inferior shelf at the ostium with stenosis of 70% to 80%.  The left    anterior descending coronary artery had 20% stenosis at mid point.  There    were three major diagonal vessels.  There was 80% to 90% mid point stenosis    on the third diagonal.  The left circumflex had 30% narrowing at the first    marginal.  The right coronary artery had a 50% stenosis at the mid segment,    95% stenosis beyond the crux just proximal to the posterior descending    coronary artery.  Mid and distal 40% posterior descending coronary artery    stenosis.  There was normal left ventricular  function. 2. On March 25, 2000, carotid duplex ultrasound.  No hemodynamically    significant internal carotid artery stenosis. 3. On March 26, 2000, coronary artery bypass graft surgery times four,    Dr. Charlett Lango, surgeon.  In this procedure, the left internal    mammary artery was connected and then beside that the left anterior    descending coronary artery.  There were risks demonstrating __________    factors from the aorta to the first obtuse marginal and sequential reverse    saphenous vein graft was fashioned from the aorta to the posterior    descending and then to the posterior lateral branch.  The patient tolerated    the procedure well and was transferred in stable satisfactory condition to    the intensive care unit.  DISPOSITION:  The patient is ready for discharge on postoperative day #5.  His postoperative recovery has been without significant incident.  He has experienced no respiratory compromise and no cardiac dysrhythmias.  He does have complaint of right shoulder stiffness soreness/soreness most likely due to intraoperative stretch injury.  He does express the fact that it has gotten better over time and does feel better after his exercise and kept up with his range of motion exercises.  The patient experienced postoperative acute blood loss anemia but did not require any postoperative transfusions.  Hemoglobin on March 29, 2000, postoperative day #3, was 8.2 and rising.  The patient has remained afebrile in the postoperative period.  His mental status has remained clear.  He is ambulating in the hallways progressive distances.  His pain is controlled well with oral analgesics.  He is taking only six Percocet per day at the time of discharge.  His incisions are healing nicely.  There is no evidence of swelling, erythema, or drainage.  DISCHARGE MEDICATIONS:  1. Percocet 5/325 1-2 tabs p.o. q.4-6h. p.r.n. pain.  2. Lasix 40 mg daily for a five-day period.   3. Potassium chloride 20 mEq daily for a five-day period.  4. Niferex 150 mg daily.  5. Lopressor 50 mg 1/2-tab in the morning and 1/2-tab in the evening.  6. Zocor 20 mg at bedtime daily.  7. Enteric-coated aspirin 325 mg daily.  8. Prilosec 20 mg daily.  9. Celebrex 200 mg daily. 10. Effexor 75 mg daily. 11. Vitamin E 400 IU daily. 12. Folic acid 400 mcg daily. 13. Allegra 180 mg daily.  DISCHARGE ACTIVITY:  Ambulation as tolerated.  He was asked not to lift any weight more than ten pounds nor to drive for the next six weeks.  DISCHARGE DIET:  Low sodium, low cholesterol.  WOUND CARE:  He may shower daily keeping his incisions clean and dry.  FOLLOW-UP APPOINTMENT:  He will see Dr. Tresa Endo in two weeks.  He is to call [phone] to arrange the appointment.  A chest x-ray will be taken.  He will also see Dr. Dorris Fetch on Wednesday, April 28, 2000, at 9:45 a.m., and he is asked to bring the chest x-ray to that appointment.  HISTORY OF PRESENT ILLNESS:  The patient is a 70 year old gentleman who presents with a history of chest discomfort for a number of years.  He was having some chest tightness and pressure, but in the last year, it has become progressively worse, and he can not climb a hill or do any physical activity without experiencing chest tightness or shortness of breath.  He also feels weak and experiences a hot sensation in the upper body during these episodes of pressure and tightness.  The patient underwent a stress Cardiolite study which was positive for ischemia in the inferolateral and apical regions.  He was scheduled for left heart catheterization at [facility] on March 24, 2000.  HOSPITAL COURSE:  The patient reported to [facility] for elective left heart catheterization.  The study has been dictated above demonstrating obstructive lesions in the left main coronary artery and in the  right coronary artery.  He was seen by Dr.  Charlett Lango in consultation for the cardiovascular thoracic surgeon in [place] who described the risks and benefits of revascularization surgery to the patient.  The patient agreed to the risks and arranged to have the procedure.  This was done on March 26, 2000.  Four bypasses were placed as previously described.  The patient was extubated the day of surgery.  He was off supplemental oxygen and ambulating well by postoperative day #2.  He did not experience any postoperative cardiac arrhythmias.  He did complain of some right shoulder pain which has been treated with heat and oral analgesics as well as range of motion exercises. He has remained afebrile in the postoperative period.  His mental status has been clear.  His pain is well-controlled.  He is ready for discharge March 31, 2000 with medications and follow up as dictated above. DD:  03/30/00 TD:  03/31/00 Job: [id number]

## 2010-06-06 NOTE — Procedures (Signed)
NAME:  [name], [name] NO.:  [id number]   MEDICAL RECORD NO.:  [id number]          PATIENT TYPE:  OUT   LOCATION:  SLEEP CENTER                 FACILITY:  [facility]   PHYSICIAN:  Marcelyn Bruins, M.D. [facility] DATE OF BIRTH:  1940-02-07   DATE OF STUDY:  01/31/2004                              NOCTURNAL POLYSOMNOGRAM   REFERRING PHYSICIAN:  Dr. Darryll Capers   INDICATION FOR THE STUDY:  Hypersomnia with sleep apnea.   SLEEP ARCHITECTURE:  The patient had a total sleep time of 194 minutes with  significantly decreased REM and slow wave sleep. Sleep onset latency was  very prolonged at 154 minutes and REM latency was prolonged as well.   IMPRESSION:  1.  Severe obstructive sleep apnea/hypopnea syndrome with a respiratory      disturbance index of 44 events per hour and O2 desaturation as low as      87%. The events were not positional. The patient did not meet split      night criteria due to the very late sleep onset. The patient was very      restless with frequent movements while awake.  2.  Moderate to loud snoring noted throughout.  3.  No clinically significant cardiac arrhythmias.  4.  Very large numbers of leg jerks with significant sleep disruption. It is      really unclear how much of this is attributable to his sleep apnea and      how much to a secondary sleep disorder. Clinical correlation is      suggested after appropriate treatment of his sleep apnea.      KC/MEDQ  D:  02/12/2004 10:04:49  T:  02/12/2004 15:36:58  Job:  [id number]

## 2010-06-06 NOTE — Cardiovascular Report (Signed)
[place]. [facility]  Patient:    [name], [name]                    MRN: [id number] Proc. Date: 07/26/00 Adm. Date:  [id number] Attending:  Virgina Evener CC:         Stacie Glaze, M.D. [facility]   Cardiac Catheterization  INDICATIONS:  The patient is 70 year old gentleman, who underwent bypass surgery in March 2002.  Catheterization at that time revealed coronary calcification with 80% ostial left main stenosis, 89% stenosis in a small third diagonal vessel, 20-30% mid LAD narrowing, 30% OM stenosis, and a diffuse 50% RCA mid right coronary stenosis with 95% distal right coronary artery stenosis.  He underwent bypass surgery with a LIMA to the LAD, vein to the marginal and sequential vein to the PDA and PLA.  Initially, he had felt significantly improved, but recently began to notice a decline in exercise tolerance with shortness of breath as well as intrascapular chest pain.  A followup Cardiolite study done on June 19 showed marked improvement in his prior perfusion abnormality in the inferior to infralateral wall. However, there was still subtle inferolateral thinning with borderline ischemia distally.  Because of his change in symptomatology, he is now referred for repeat definitive catheterization.  PROCEDURES PERFORMED:  Left heart catheterization, cine coronary angiography, biplane cine left ventriculography, selective angiography into the sequential vein graft to the PDA, PLA, angiography in the vein graft to the obtuse marginal vessel, and selective angiography into the left internal mammary artery.  HEMODYNAMIC DATA:  Central aortic pressure 106/56, left ventricular pressure 106/21.  ANGIOGRAPHIC DATA:  The left main coronary artery had 80% ostial stenosis and bifurcated into an LAD and left circumflex system.  The LAD had a 30% narrowing in the second diagonal vessel.  The third diagonal vessel was small and had narrowing of at least  80%.  There was a suggestion of muscle bridging in the middle LAD beyond the LIMA insertion with a narrowing of approximately 40 to less than 50% during systole from a native injection.  The left circumflex vessel gave rise to a prominent marginal vessel that had 20% proximal stenosis.  The right coronary artery had diffuse narrowing of 30% proximally and 50-60% in the mid segment with a 90% stenosis in the very small anterior RV marginal branch, a 30-40% region of the crux.  There was 95% stenosis between the PDA takeoff.  There was diffuse 40-50% stenosis in the mid distal PDA beyond the vein graft insertion site.  Selective angiography into the vein graft to the circumflex vessel revealed this to be widely patent.  There was mild tapering of approximately 30% at the anastomosis into the OM vessel.  The vein graft to the right coronary artery was widely patent and anastomosed sequentially into the PDA and PLA.  As noted above, there was diffuse 40-50% narrowing in this small PDA beyond the graft insertion.  The left internal mammary artery was widely patent.  This vessel was tortuous in its mid segment and there was a suggestion of a "mild kinking" with narrowing of approximately 40% in this segment.  The previously noted muscle bridging appeared much less in the mid LAD beyond the LIMA insertion, and approximately only 30%.  Biplane cinearteriography revealed preserved global LV contractility.  There was a suggestion of a mild mitral valve prolapse with trace MR.  IMPRESSION: 1. Normal left ventricular function. 2. Suggestion of a mild mitral valve prolapse.  3. Significant native coronary obstructive disease with 80% ostial    left main stenosis, 30% second diagonal stenosis with 80-90%    diffuse stenosis and a very small third diagonal vessel and evidence    for muscle bridging of 40-50% in the mid left anterior descending beyond    the left internal mammary artery  insertion, 20% obtuse marginal    stenosis, diffuse right coronary artery disease with 30% proximally    and 50-60% in the mid segment and 90% distally before the posterior    descending artery takeoff and diffuse 40-50% mid distal posterior    descending artery stenoses beyond the graft insertion. 4. Patent sequential vein graft to the right coronary artery, to the    posterior descending artery, and posterolateral artery. 5. Patent vein graft to the circumflex marginal vessel with distal    tapering of 30% as the anastomosis. 6. Patent left internal mammary artery to the left anterior descending    with evidence for tortuosity with questionable kinking in the mid segment    with narrowing of approximately 30%.  RECOMMENDATIONS:  Medical therapy. DD:  07/26/00 TD:  07/26/00 Job: [id number]

## 2010-06-06 NOTE — Cardiovascular Report (Signed)
[place]. [facility]  Patient:    [name], [name]                    MRN: [id number] Proc. Date: 03/24/00 Adm. Date:  [id number] Attending:  Virgina Evener CC:         Orville Govern, M.D.  Cath Lab.  Salvatore Decent Dorris Fetch, M.D.  Darryll Capers, M.D.   Cardiac Catheterization  PROCEDURES PERFORMED:  Cardiac catheterization.  INDICATIONS:  [name] is a very pleasant 70 year old gentleman who is the husband of one of my patients. I initially saw him on February 24, 2000 at which time he had recently noticed exertional chest tightness. He does have a history of mild hyperlipidemia, also a remote history of bladder CA, Peyronies disease status post implant and degenerative disease of the thoracic spine. He underwent an exercise Cardiolite study which suggested stress-induced ischemia in the inferolateral wall and apex. The patient had been started on Norvasc empirically at his initial office visit and since initiating therapy, he has had complete chest pain resolution. The patient was seen in the office yesterday in follow-up of his Cardiolite scan. He is now referred for definitive diagnostic cardiac catheterization.  HEMODYNAMIC DATA: 1. Central aortic pressure was 144/79 mmHg. 2. Left ventricular pressure 144/33 mmHg.  ANGIOGRAPHIC DATA: 1. Left main coronary artery:  There was significant coronary calcification    involving the left main as well as right coronary systems. The    left main coronary artery had an inferior shelf at the ostium with stenosis    of 70-80% in the ostial to proximal segment that was fairly focal. The    left main coronary artery bifurcated into a left anterior descending artery    and left circumflex system. 2. Left anterior descending artery:  The left anterior descending artery gave    rise to three major diagonal vessels. There was an 80-90% mid stenosis    in the third diagonal vessel, there was 20%  narrowing in the mid left    anterior descending artery. 3. Circumflex coronary artery:  The circumflex vessel gave rise to a prominent    first marginal vessel and small second marginal vessel. There was 30%    narrowing in this first marginal vessel. 4. Right coronary artery:  The right coronary artery was of large caliber.    There was diffuse 50% slue of stenosis in its midsegment. There was 95%    stenosis beyond the crux but just proximal to the posterior descending    artery distal right coronary artery bifurcation. There was mid and distal    40% posterior descending artery narrowings. 5. Left subclavian and internal mammary artery:  The left subclavian and    internal mammary artery were normal and suitable for coronary artery bypass    grafting revascularization surgery.  BIPLANE CINEANGIOGRAPHY: 1. One plus angiographic mitral valve prolapse. 2. There was normal global left ventricular function. 3. There was very subtle minimal inferolateral hypocontractility.  DISTAL AORTOGRAPHY:  The distal aortography did not demonstrate any significant aortoiliac disease. There was no renal artery stenosis.  IMPRESSION: 1. Normal left ventricular function with subtle inferolateral hypocontractility. 2. Mitral valve prolapse. 3. Significant coronary calcification with significant coronary    obstructive disease with 80% ostial left main coronary artery stenosis,    80-90% third diagonal stenosis of the left anterior descending artery with    20% mid left anterior descending artery narrowing, 30% first obtuse  marginal stenosis  of the left circumflex coronary artery, diffuse 50% mid    right coronary artery stenosis and 95% distal right coronary artery stenosis just prior to the bifurcation of the posterior descending artery    and distal right coronary artery.  RECOMMENDATIONS:  Coronary artery bypass grafting revascularization surgery. DD:  03/24/00 TD:  03/24/00 Job:  [id number]

## 2010-06-06 NOTE — Op Note (Signed)
   NAME:  [name], [name]                       ACCOUNT NO.:  [id number]   MEDICAL RECORD NO.:  [id number]                   PATIENT TYPE:  AMB   LOCATION:  [facility]                                 FACILITY:  [facility]   PHYSICIAN:  Maretta Bees. [name], M.D.             DATE OF BIRTH:  October 16, 1940   DATE OF PROCEDURE:  12/28/2001  DATE OF DISCHARGE:                                 OPERATIVE REPORT   PREOPERATIVE DIAGNOSIS:  Recurrent bladder carcinoma, penile prosthesis and  urethral stricture.   POSTOPERATIVE DIAGNOSIS:  Recurrent bladder carcinoma, penile prosthesis and  urethral stricture.   PROCEDURE:  Cystoscopy, urethral dilation, cold cup bladder biopsy and right  retrograde pyelogram with interpretation.   SURGEON:  Maretta Bees. [name], M.D.   ANESTHESIA:  General.   INDICATIONS:  This 70 year old gentleman underwent surveillance cystoscopy  and was found to have a recurrent bladder carcinoma just lateral to the  right ureteral orifice. He is brought to the operating room today for  further treatment and evaluation. He also has a known mild bulbous urethral  stricture.   PROCEDURE:  The patient was brought to the operating room and placed in the  lithotomy position. The external genitalia were prepped and draped in the  usual sterile fashion.   He was cystoscoped and in order to do so I had to dilate a mild bulbous  urethral stricture. He had a somewhat high bladder neck. The only lesion in  the bladder was a 0.5 cm papillary tumor just lateral to the right ureteral  orifice which I removed totally with a cold cup bladder biopsy forceps and  fulgurated with a Bugbee electrode. I  then performed a right retrograde  pyelogram with a cone tipped catheter and there were no filling defects or  lesions in the upper collecting system.   The bladder was emptied and the scope was removed. The patient was  sent to  the recovery room in good condition.                  Maretta Bees. [name], M.D.    LJP/MEDQ  D:  12/28/2001  T:  12/28/2001  Job:  [id number]
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
## 2010-06-06 NOTE — Op Note (Signed)
[facility]  Patient:    [name], [name]                    MRN: [id number] Proc. Date: 08/11/00 Adm. Date:  [id number] Disc. Date: [id number] Attending:  Virgina Evener CC:         Lennette Bihari, M.D.   Operative Report  PREOPERATIVE DIAGNOSIS:  Recurrent bladder carcinoma.  POSTOPERATIVE DIAGNOSIS:  Recurrent bladder carcinoma.  OPERATION: 1. Cystoscopy. 2. Resection and fulguration of bladder carcinoma.  SURGEON:  Maretta Bees. [name], M.D.  ANESTHESIA:  General.  INDICATIONS:  This 70 year old white male has a history of bladder carcinoma in August 1995 and December 1996.  He had a biopsy of the bladder in 1997 that was benign, and he has had no recurrent tumors until a few days ago when he underwent cystoscopy in the office, and he had a papillary tumor just inside the bladder neck.  I was able to see it with retroflexion of the flexible scope.  He is brought to the OR today for removal of this lesion.  DESCRIPTION OF PROCEDURE:  The patient was brought to the operating room and placed in lithotomy position.  External genitalia were prepped and draped in the usual fashion.  He underwent cystoscopy, and there was a sharp angulation up to the high-riding bladder neck complicated by the presence of a penile prosthesis.  There was a papillary tumor seen basically only with the right angle lens just inside the bladder neck at 10 oclock.  I could not access the lesion with the cold cup bladder biopsy forceps.  I then inserted a 24-French resectoscope sheath after dilating with a 28-French, and the loop almost could reach the lesion, but I could not resect it.  Therefore, I put the cystoscope back in and, using L-brand elevator, I was able to deflect the Bugbee electrode and reach it.  I started to fulgurate, and then I realized I would like to try and get tissue for biopsy if possible; so I put in an alligator forceps and got a small piece  of tissue, but it was a very tight and difficult angle.  I then reinserted the Bugbee Electrode and fulgurated this lesion. Actually, in the course of fulgurating the base, I did remove tissue attached to the Bugbee Electrode.  It was sent for pathology.  Hopefully there will be no excessive burn effects so that they can still get a pathologic reading. The lesion was 1 to 1.5 cm in size.  I completely fulgurated and destroyed it including surrounding mucosa with the Bugbee Electrode and L-brand elevator. There was essentially no bleeding from this biopsy site and very excellent hemostasis.  There was some slight bleeding from the bladder neck from instrumentation.  I felt it best to leave in the Foley catheter, so a 22-French 5 cc Foley was inserted and connected to closed drainage, and he will remove that tomorrow at home. DD:  08/11/00 TD:  08/11/00 Job: [id number]

## 2010-06-06 NOTE — Assessment & Plan Note (Signed)
[facility]                         GASTROENTEROLOGY OFFICE NOTE   NAME:Lesiak, ALVAR MALINOSKI                    MRN:          [id number]  DATE:04/28/2006                            DOB:          [birth year]    PROBLEM LIST:  1. Diarrhea.  2. Dysphagia.   Mr. Demaria has returned for reevaluation. He has had longstanding  problems with intermittent diarrhea. This seems to be getting worse. He  has a full feeling and complains of some lower abdominal pain with  passage of mucous. He underwent colonoscopy in October 2007 which was  normal. He also still complains of some dysphagia to solids. In October,  he underwent Savary dilatation to 18 mm.   PHYSICAL EXAMINATION:  VITAL SIGNS:  Pulse 70, blood pressure 120/68,  weight 204.   IMPRESSION:  1. Intermittent diarrhea. Together with history of iron deficiency      anemia at least raises the question of malabsorption.  2. Recurrent dysphagia.   RECOMMENDATIONS:  1. Upper endoscopy with balloon dilatation. I will obtain small bowel      biopsies to rule out celiac sprue.  2. Check iron studies, folate, B12, keratin level and CBC.     Barbette Hair. Arlyce Dice, MD,FACG  Electronically Signed    RDK/MedQ  DD: 04/28/2006  DT: 04/28/2006  Job #: [id number]   cc:   Samul Dada, M.D.

## 2010-06-06 NOTE — Op Note (Signed)
[place]  Patient:    [name], [name]                    MRN: [id number] Proc. Date: 03/26/00 Adm. Date:  [id number] Attending:  Charlett Lango CC:         Nicki Guadalajara, M.D.  Stacie Glaze, M.D.   Operative Report  PREOPERATIVE DIAGNOSIS:  Left main coronary disease with Class 3 progressive angina.  POSTOPERATIVE DIAGNOSIS:  Left main coronary disease with Class 3 progressive angina.  OPERATION PERFORMED: 1. Median sternotomy. 2. extracorporeal circulation. 3. Coronary artery bypass grafting times four (left internal mammary artery    to left anterior descending, saphenous vein graft to obtuse marginal-1,    saphenous vein graft to posterior descending and posterolateral).  SURGEON:  Salvatore Decent. Dorris Fetch, M.D.  ASSISTANT:  Maxwell Marion, RNFA  ANESTHESIA:  General.  FINDINGS:  Small coronaries.  Posterior and posterolateral of fair quality. LAD and OM-1 good quality.  Good quality vein.  Good quality mammary artery. Left ventricular hypertrophy.  CLINICAL NOTE:  [name] is a 70 year old gentleman with history of Class 3 angina that has become progressive over the past several weeks.  He underwent stress test, which showed evidence of inferior ischemia.  He then underwent cardiac catheterization, which showed an 80% left main stenosis and a tight distal right coronary stenosis at the bifurcation that was about 95%. Left ventricular function was reasonably well preserved with an ejection fraction of approximately 50%.  The patient was referred for coronary artery bypass grafting.  The indications, risks, benefits and alternatives were discussed in detail with the patient.  He understood, accepted and agreed to proceed.  DESCRIPTION OF PROCEDURE:  [name] was brought to the preop holding area on March 26, 2000.  Monitors were placed to monitor arterial, central venous and pulmonary arterial pressures.  EKG  leads were placed for continuous telemetry.  The patient was brought to the operating room, anesthetized and intubated.  A Foley catheter was placed.  Intravenous antibiotics were administered.  The chest, abdomen and leg were prepped and draped in the usual fashion.  A median sternotomy were performed.  Simultaneous incisions were made in the medial aspect of the right leg at the ankle to harvest the saphenous vein. The saphenous vein at the ankle was small, therefore the vein was harvested from the thigh from the groin to just below the knee.  This was a good quality segment of vein.  The left internal mammary artery was harvested in an extrapleural fashion.  The edges were doubly clipped and divided.  The patient was fully heparinized prior to dividing the distal end of the mammary artery. There was good flow to the cut end of the vessel.  The mammary was placed in a papaverine soaked sponge and placed into the left pleural space.  The pericardium was opened.   The ascending aorta was inspected and palpated. There was no palpable atherosclerotic disease.  The aorta was cannulated via concentric two Ethibond nonpledgetted purse-string sutures.  The dual-staged venous cannula was placed via purse-string suture in the right atrial appendage.  Cardiopulmonary bypass was instituted and the patient was cooled to 32 degrees Celsius.  The coronary arteries were inspected and anastomotic sites were chosen.  The conduits were inspected and cut to length.  A foam pad was placed in the pericardium to protect the left phrenic nerve.  A temperature probe was placed in the myocardial septum and a  cardioplegia cannula was placed in the ascending aorta.  The aorta was crossclamped.  The left ventricle was emptied via the aortic root vent.  Cardiac arrest was achieved with a combination of cold antegrade blood cardioplegia and topical iced saline.  After administering 600 cc if cardioplegia the  myocardial septal temperature was 9 degrees Celsius.  The following distal anastomoses were performed.  First a reversed saphenous vein graft was placed end-to-side to the posterior descending and posterolateral branches of the right coronary artery.  The posterior descending was a 1.5 mm fair quality target.  There was some disease distal to the anastomosis.  A side-to-side anastomosis was performed off the side branch of the vein graft using a running 7-0 Prolene suture.  There was good flow through the anastomosis.  An end-to-side anastomosis then was constructhed to the third posterolateral branch.  This was, by far, te largest of the posterolaterals and the only one adequate for grafting.  It was a 1.3 mm fair quality target.  Again the anastomosis was performed with a running 7-0 Prolene suture.  The graft was then flushed.  There was excellent flow.  Cardioplegia was administered.  There was good hemostasis at both anastomose.  Next, a reversed saphenous vein graft was placed end-to-side to the first obtuse marginal branch of the left circumflex coronary artery.  This circulation was compromised the by 80% ostial LAD stenosis.  The first obtuse marginal was a 1.5 mm good quality target.  The anastomosis was performed with a running 7-0 Prolene suture and probed distally and proximally to insure patency before tying the suture.  Again, there was excellent flow to the graft.  Cardioplegia was administered and there was good hemostasis.  Next, a window was made in the anterior portion of the pericardium.  The left internal mammary artery was brought through this window.  The distal end was spatulated and was anastomosed end-to-side to the LAD.  The LAD  was a 1.5 mm good quality target.  The mammary artery was a 2 mm good quality conduit.  At completion of the mammary to LAD anastomosis the bulldog clamp was removed  from the mammary.  Immediate septal rewarming was noted.   Lidocaine was administered.  The mammary pedicle was tacked to the epicardial surface of the heart with 6-0 Prolene sutures.  The aortic crossclamp was removed.  Tota; crossclamp time was 53 minutes.  The patient initially fibrillated, but then spontaneously reverted to sinus bradycardia.  A partial occlusion clamp was placed on the ascending aorta.  The cardioplegia cannula was removed. The proximal vein graft anastomoses were performed to 4.0 mm punch aortotomies with running 6-0 Prolene sutures.  At completion of the final proximal anastomosis the patient was placed in Trendelenburg position. Air was allowed to vent as the partial clamp was removed prior to tying the suture.  Air was then aspirated from both veins and bulldog clamps were removed, and flow was restored.  Of note, the patient had been placed on renal dose dopamine during the bypass run for relatively low urine output, approximately 70 minutes into the bypass.  Rewarming was begun at the completion of the LIMA to the LAD anastomosis. Epicardial pacing wires were placed on the right ventricle and right atrium. After patient was rewarmed to 36.8 degrees Celsius he was weaned from cardiopulmonary bypass, still on renal dose dopamine, and DDD paced at a rate of 90 beats/minute.  The total bypass time was 105 minutes.  The initial cardiac index was  greater than 2 liters/minute/meter squared and the patient maintained good cardiac function throughout the post bypass period.  The post tussive protamine was administered and was well tolerated.  The atrial and aortic cannulae were removed.  The remainder of the protamine was administered without incident.  The chest was irrigated with 1 liter of warm normal saline containing 1 gram of vancomycin.  Hemostasis was achieved.  The pericardium was brought together in the midline.  It could not be completely reapproximated for fear of compressing the heart or underlying vein  grafts. The left pleural space was opened enough to admit a chest tube in the left pleura; and, two mediastinal chest tubes were placed through separate subcostal incisions.  The wound was irrigated with 1 liter of warm normal saline containing 1 gram of vancomycin.  After assuring adequate hemostasis the sternum was closed with heavy gauge stainless steel wires.  The pectoralis fascia was closed with running #1 Vicryl suture.  The subcutaneous tissue was closed with a running 2-0 Vicryl suture and the skin was closed with a 3-0 Vicryl subcuticular suture.  All sponge, needle and instrument counts were correct at the end of the procedure.  After being moved from the OR table to the bed for transport to the SICU the patient had a drop in his blood pressure.  I was called back to the room and noted that the patient had low filling pressures.  He was treated with _____ administration.  Dopamine and Neo-Synephrine drips were increased slightly and the patients blood pressure  stabilized.  He then was transported to the surgical intensive care unit without incident. DD:  03/26/00 TD:  03/28/00 Job: [id number]

## 2010-06-21 ENCOUNTER — Other Ambulatory Visit: Payer: Self-pay | Admitting: Internal Medicine

## 2010-07-02 ENCOUNTER — Ambulatory Visit (INDEPENDENT_AMBULATORY_CARE_PROVIDER_SITE_OTHER): Payer: Medicare Other | Admitting: Internal Medicine

## 2010-07-02 DIAGNOSIS — D519 Vitamin B12 deficiency anemia, unspecified: Secondary | ICD-10-CM

## 2010-07-02 DIAGNOSIS — D518 Other vitamin B12 deficiency anemias: Secondary | ICD-10-CM

## 2010-07-02 MED ORDER — CYANOCOBALAMIN 1000 MCG/ML IJ SOLN
1000.0000 ug | Freq: Once | INTRAMUSCULAR | Status: AC
  Administered 2010-07-02: 1000 ug via INTRAMUSCULAR

## 2010-07-11 ENCOUNTER — Ambulatory Visit (INDEPENDENT_AMBULATORY_CARE_PROVIDER_SITE_OTHER): Payer: Medicare Other | Admitting: Internal Medicine

## 2010-07-11 ENCOUNTER — Encounter: Payer: Self-pay | Admitting: Internal Medicine

## 2010-07-11 VITALS — BP 110/70 | HR 72 | Temp 98.2°F | Resp 16 | Ht 69.0 in | Wt 190.0 lb

## 2010-07-11 DIAGNOSIS — K209 Esophagitis, unspecified without bleeding: Secondary | ICD-10-CM

## 2010-07-11 DIAGNOSIS — L570 Actinic keratosis: Secondary | ICD-10-CM

## 2010-07-11 DIAGNOSIS — I10 Essential (primary) hypertension: Secondary | ICD-10-CM

## 2010-07-11 DIAGNOSIS — K222 Esophageal obstruction: Secondary | ICD-10-CM

## 2010-07-11 DIAGNOSIS — M1710 Unilateral primary osteoarthritis, unspecified knee: Secondary | ICD-10-CM

## 2010-07-11 MED ORDER — ESOMEPRAZOLE MAGNESIUM 40 MG PO CPDR: 40.0000 mg | DELAYED_RELEASE_CAPSULE | Freq: Two times a day (BID) | ORAL | Status: DC

## 2010-07-11 MED ORDER — METHYLPREDNISOLONE ACETATE 40 MG/ML IJ SUSP: 20.0000 mg | Freq: Once | INTRAMUSCULAR | Status: DC

## 2010-07-11 NOTE — Progress Notes (Signed)
Subjective:    Patient ID: [name], male    DOB: May 10, 1940, 70 y.o.   MRN: [id number]  HPI  The patient had a recent dilation of his esophagus with a history of Barrett's esophagus.  He still is having some dysphagia, gastritis, with painful swallowing. He is currently on Prilosec 40 mg and Reglan He is also having increased pain in his knee he has osteoarthritis, degenerative joint disease of the left knee. Blood pressure is in good control combination of lisinopril and diastolic Cholesterol monitoring is not due at this time   Review of Systems  Constitutional: Negative for fever and fatigue.  HENT: Negative for hearing loss, congestion, neck pain and postnasal drip.   Eyes: Negative for discharge, redness and visual disturbance.  Respiratory: Negative for cough, shortness of breath and wheezing.   Cardiovascular: Negative for leg swelling.  Gastrointestinal: Positive for nausea. Negative for abdominal pain, constipation and abdominal distention.       Dysphagia gastritis symptoms  Genitourinary: Negative for urgency and frequency.  Musculoskeletal: Negative for joint swelling and arthralgias.  Skin: Negative for color change and rash.  Neurological: Negative for weakness and light-headedness.  Hematological: Negative for adenopathy.  Psychiatric/Behavioral: Negative for behavioral problems.   Past Medical History  Diagnosis Date  . Esophagitis   . Colon polyps   . History of bladder cancer   . Esophagus, Barrett's   . Stenosis of esophagus   . Localized osteoarthrosis, lower leg   . Malaise and fatigue   . Hypothyroidism   . Hypertension   . Anemia   . B12 deficiency anemia   . GERD (gastroesophageal reflux disease)   . Hyperlipidemia   . Depression   . Allergy   . CAD (coronary artery disease)   . COPD (chronic obstructive pulmonary disease)   . Low back pain    Past Surgical History  Procedure Date  . Coronary artery bypass graft     x4  .  Cervical discectomy     ACDF  . Lumbar laminectomy     and fusion  . Bladder cancer   . Popliteal synovial cyst excision   . Knee arthroscopy     reports that he quit smoking about 37 years ago. He does not have any smokeless tobacco history on file. He reports that he does not drink alcohol or use illicit drugs. family history includes Cancer in his mother; Coronary artery disease in an unspecified family member; Hypertension in his father; and Stroke in his father. No Known Allergies     Objective:   Physical Exam  Constitutional: He appears well-developed and well-nourished.  HENT:  Head: Normocephalic and atraumatic.  Eyes: Conjunctivae are normal. Pupils are equal, round, and reactive to light.  Neck: Normal range of motion. Neck supple.  Cardiovascular: Normal rate and regular rhythm.   Pulmonary/Chest: Effort normal and breath sounds normal.  Abdominal: Soft. Bowel sounds are normal.  Musculoskeletal:       Degenerative changes to the left knee          Assessment & Plan:  Patient's Barrett's esophagitis has certainly been difficult to control with a combination of Reglan and over-the-counter PPIs.  We have increased his Nexium to one by mouth twice a day we'll continue the Reglan.  His blood pressure is well controlled with current medications including his diastolic has no chest pain he does have an unusual pain radiating to his left axillae that is momentary there are no masses noted axillae on  physical examination and the pain only lasts for a few seconds I am wondering if this pain is related to his esophagitis.  He does not have a pleuritic component to the pain nor is there an exertional component. The pain does not seem to be accelerating in frequency he cannot do anything to reproduce the pain.  He did have a chest x-ray a CT scan last year.  His knee pain has worsened and he is requesting an injection. Informed consent obtained and the patient's left knee  was cleaned with Betadine local anesthesia obtained with topical spray 40 mg of Depo-Medrol and 1/2 cc of lidocaine was injected into the joint space the patient tolerated the injection well post injection care discussed with patient ice placed    The patient gave informed consent and the site was identified as actinic keratoses 40 seconds of cryotherapy using liquid nitrogen was applied to the lesion the patient tolerated the procedure well post care instructions were given to the patient

## 2010-08-11 ENCOUNTER — Telehealth: Payer: Self-pay | Admitting: *Deleted

## 2010-08-11 ENCOUNTER — Other Ambulatory Visit: Payer: Self-pay | Admitting: Internal Medicine

## 2010-08-11 MED ORDER — OXYCODONE-ACETAMINOPHEN 10-325 MG PO TABS: 1.0000 | ORAL_TABLET | Freq: Four times a day (QID) | ORAL | Status: DC | PRN

## 2010-08-11 NOTE — Telephone Encounter (Signed)
Done

## 2010-08-11 NOTE — Telephone Encounter (Signed)
Pt is coming in for b-12 inj tomorrow and is req to pick up scripts for oxyCODONE-acetaminophen (PERCOCET) 10-325 MG per tablet when pt comes in for injs.

## 2010-08-12 ENCOUNTER — Ambulatory Visit (INDEPENDENT_AMBULATORY_CARE_PROVIDER_SITE_OTHER): Payer: Medicare Other | Admitting: Internal Medicine

## 2010-08-12 DIAGNOSIS — D519 Vitamin B12 deficiency anemia, unspecified: Secondary | ICD-10-CM

## 2010-08-12 DIAGNOSIS — D518 Other vitamin B12 deficiency anemias: Secondary | ICD-10-CM

## 2010-08-12 MED ORDER — CYANOCOBALAMIN 1000 MCG/ML IJ SOLN
1000.0000 ug | INTRAMUSCULAR | Status: DC
  Administered 2010-08-12: 1000 ug via INTRAMUSCULAR

## 2010-08-25 ENCOUNTER — Other Ambulatory Visit: Payer: Self-pay | Admitting: Internal Medicine

## 2010-09-07 ENCOUNTER — Inpatient Hospital Stay (INDEPENDENT_AMBULATORY_CARE_PROVIDER_SITE_OTHER)
Admission: RE | Admit: 2010-09-07 | Discharge: 2010-09-07 | Disposition: A | Discharge status: Home / Self Care | Payer: Medicare Other | Source: Ambulatory Visit | Attending: Family Medicine | Admitting: Family Medicine

## 2010-09-07 DIAGNOSIS — B880 Other acariasis: Secondary | ICD-10-CM

## 2010-09-16 ENCOUNTER — Ambulatory Visit (INDEPENDENT_AMBULATORY_CARE_PROVIDER_SITE_OTHER): Payer: Medicare Other | Admitting: Internal Medicine

## 2010-09-16 DIAGNOSIS — D518 Other vitamin B12 deficiency anemias: Secondary | ICD-10-CM

## 2010-09-16 DIAGNOSIS — D519 Vitamin B12 deficiency anemia, unspecified: Secondary | ICD-10-CM

## 2010-09-16 MED ORDER — CYANOCOBALAMIN 1000 MCG/ML IJ SOLN
1000.0000 ug | INTRAMUSCULAR | Status: DC
  Administered 2010-09-16 – 2010-11-05 (×2): 1000 ug via INTRAMUSCULAR

## 2010-09-17 ENCOUNTER — Ambulatory Visit: Payer: Medicare Other | Admitting: Internal Medicine

## 2010-09-25 ENCOUNTER — Other Ambulatory Visit: Payer: Self-pay | Admitting: Internal Medicine

## 2010-10-01 ENCOUNTER — Ambulatory Visit (INDEPENDENT_AMBULATORY_CARE_PROVIDER_SITE_OTHER): Payer: Medicare Other | Admitting: Internal Medicine

## 2010-10-01 ENCOUNTER — Encounter: Payer: Self-pay | Admitting: Internal Medicine

## 2010-10-01 VITALS — BP 120/80 | HR 72 | Temp 98.2°F | Resp 16 | Ht 69.0 in | Wt 190.0 lb

## 2010-10-01 DIAGNOSIS — IMO0001 Reserved for inherently not codable concepts without codable children: Secondary | ICD-10-CM

## 2010-10-01 DIAGNOSIS — I10 Essential (primary) hypertension: Secondary | ICD-10-CM

## 2010-10-01 DIAGNOSIS — T148XXA Other injury of unspecified body region, initial encounter: Secondary | ICD-10-CM

## 2010-10-01 DIAGNOSIS — Z23 Encounter for immunization: Secondary | ICD-10-CM

## 2010-10-01 DIAGNOSIS — W57XXXA Bitten or stung by nonvenomous insect and other nonvenomous arthropods, initial encounter: Secondary | ICD-10-CM

## 2010-10-01 DIAGNOSIS — E876 Hypokalemia: Secondary | ICD-10-CM

## 2010-10-01 LAB — BASIC METABOLIC PANEL
CO2: 29 mEq/L (ref 19–32)
Chloride: 106 mEq/L (ref 96–112)
Creatinine, Ser: 1.1 mg/dL (ref 0.4–1.5)
Sodium: 142 mEq/L (ref 135–145)

## 2010-10-01 MED ORDER — OXYCODONE-ACETAMINOPHEN 10-325 MG PO TABS: 1.0000 | ORAL_TABLET | Freq: Four times a day (QID) | ORAL | Status: DC | PRN

## 2010-10-01 MED ORDER — DOXYCYCLINE HYCLATE 100 MG PO TABS: 100.0000 mg | ORAL_TABLET | Freq: Two times a day (BID) | ORAL | Status: AC

## 2010-10-01 MED ORDER — VENLAFAXINE HCL 75 MG PO TABS: 75.0000 mg | ORAL_TABLET | Freq: Two times a day (BID) | ORAL | Status: DC

## 2010-10-01 NOTE — Progress Notes (Signed)
Subjective:    Patient ID: [name], male    DOB: 1940/03/18, 70 y.o.   MRN: [id number]  HPI blood pressure is stable on reduced blood pressure medications.  It should be noted that he had high potassium detected by his cardiologist the lisinopril was discontinued and was started and was prescribed the plan was for him to check a potassium and the potassium remained elevated to discontinue the losartan his blood pressure is well-controlled on the reduced dose of the diastolic his pulse is 72 we will refill his pain medications and his Effexor today.    Review of Systems  Constitutional: Negative for fever and fatigue.  HENT: Negative for hearing loss, congestion, neck pain and postnasal drip.   Eyes: Negative for discharge, redness and visual disturbance.  Respiratory: Negative for cough, shortness of breath and wheezing.   Cardiovascular: Negative for leg swelling.  Gastrointestinal: Negative for abdominal pain, constipation and abdominal distention.  Genitourinary: Negative for urgency and frequency.  Musculoskeletal: Negative for joint swelling and arthralgias.  Skin: Negative for color change and rash.  Neurological: Negative for weakness and light-headedness.  Hematological: Negative for adenopathy.  Psychiatric/Behavioral: Negative for behavioral problems.       Past Medical History  Diagnosis Date  . Esophagitis   . Colon polyps   . History of bladder cancer   . Esophagus, Barrett's   . Stenosis of esophagus   . Localized osteoarthrosis, lower leg   . Malaise and fatigue   . Hypothyroidism   . Hypertension   . Anemia   . B12 deficiency anemia   . GERD (gastroesophageal reflux disease)   . Hyperlipidemia   . Depression   . Allergy   . CAD (coronary artery disease)   . COPD (chronic obstructive pulmonary disease)   . Low back pain    Past Surgical History  Procedure Date  . Coronary artery bypass graft     x4  . Cervical discectomy     ACDF  . Lumbar  laminectomy     and fusion  . Bladder cancer   . Popliteal synovial cyst excision   . Knee arthroscopy     reports that he quit smoking about 37 years ago. He does not have any smokeless tobacco history on file. He reports that he does not drink alcohol or use illicit drugs. family history includes Cancer in his mother; Coronary artery disease in an unspecified family member; Hypertension in his father; and Stroke in his father. No Known Allergies  Objective:   Physical Exam  Nursing note and vitals reviewed. Constitutional: He appears well-developed and well-nourished.  HENT:  Head: Normocephalic and atraumatic.  Eyes: Conjunctivae are normal. Pupils are equal, round, and reactive to light.  Neck: Normal range of motion. Neck supple.  Cardiovascular: Normal rate and regular rhythm.   Pulmonary/Chest: Effort normal and breath sounds normal.  Abdominal: Soft. Bowel sounds are normal.          Assessment & Plan:  Patient is a 70 year old white male followed for hypertension hyperlipidemia osteoarthritis with severe back pain and COPD.  We discussed the findings that were seen on his chest x-ray of mild to moderate COPD the fact that he was a smoker 40 years ago for 20+ years probably contributes to this diagnosis he is not short of breath with activity at this time we discussed that there is no apparent intervention for her COPD and less becomes symptomatic but the best intervention was continued daily activity such as  walking.  We refilled his pain medications per protocol reviewed the blood work [employer] and other testing done by his cardiologist.  It was noted that his potassium was elevated and we recheck that today on a reduced dose of an ARB versus the ace inhibitor he was on this if his potassium remains elevated I would recommend that we discontinue the ARB ACE class in this patient. He is aware of this possibility and we will contact him with the potassium result

## 2010-10-13 LAB — BASIC METABOLIC PANEL
CO2: 32
GFR calc non Af Amer: >60
Glucose, Bld: 130 — ABNORMAL HIGH
Potassium: 4.7
Sodium: 140

## 2010-10-24 LAB — POCT I-STAT 4, (NA,K, GLUC, HGB,HCT)
Glucose, Bld: 110 — ABNORMAL HIGH
HCT: 46
Hemoglobin: 15.6
Operator id: 268271
Potassium: 4.3
Sodium: 139

## 2010-11-05 ENCOUNTER — Ambulatory Visit (INDEPENDENT_AMBULATORY_CARE_PROVIDER_SITE_OTHER): Payer: Medicare Other | Admitting: Internal Medicine

## 2010-11-05 DIAGNOSIS — D518 Other vitamin B12 deficiency anemias: Secondary | ICD-10-CM

## 2010-11-17 ENCOUNTER — Other Ambulatory Visit: Payer: Self-pay | Admitting: Internal Medicine

## 2010-12-31 ENCOUNTER — Ambulatory Visit (INDEPENDENT_AMBULATORY_CARE_PROVIDER_SITE_OTHER): Payer: Medicare Other | Admitting: Internal Medicine

## 2010-12-31 ENCOUNTER — Encounter: Payer: Self-pay | Admitting: Internal Medicine

## 2010-12-31 VITALS — BP 124/74 | HR 72 | Temp 98.2°F | Resp 16 | Ht 69.0 in | Wt 192.0 lb

## 2010-12-31 DIAGNOSIS — D519 Vitamin B12 deficiency anemia, unspecified: Secondary | ICD-10-CM

## 2010-12-31 DIAGNOSIS — E785 Hyperlipidemia, unspecified: Secondary | ICD-10-CM

## 2010-12-31 DIAGNOSIS — I10 Essential (primary) hypertension: Secondary | ICD-10-CM

## 2010-12-31 DIAGNOSIS — M1710 Unilateral primary osteoarthritis, unspecified knee: Secondary | ICD-10-CM

## 2010-12-31 DIAGNOSIS — T887XXA Unspecified adverse effect of drug or medicament, initial encounter: Secondary | ICD-10-CM

## 2010-12-31 DIAGNOSIS — D518 Other vitamin B12 deficiency anemias: Secondary | ICD-10-CM

## 2010-12-31 MED ORDER — CYANOCOBALAMIN 1000 MCG/ML IJ SOLN
1000.0000 ug | Freq: Once | INTRAMUSCULAR | Status: AC
  Administered 2010-12-31: 1000 ug via INTRAMUSCULAR

## 2010-12-31 MED ORDER — ETODOLAC 300 MG PO CAPS: 300.0000 mg | ORAL_CAPSULE | Freq: Three times a day (TID) | ORAL | Status: DC

## 2010-12-31 MED ORDER — OXYCODONE-ACETAMINOPHEN 10-325 MG PO TABS: 1.0000 | ORAL_TABLET | Freq: Four times a day (QID) | ORAL | Status: DC | PRN

## 2010-12-31 MED ORDER — METHYLPREDNISOLONE ACETATE 40 MG/ML IJ SUSP: 40.0000 mg | Freq: Once | INTRAMUSCULAR | Status: DC

## 2010-12-31 NOTE — Progress Notes (Signed)
Subjective:    Patient ID: [name], male    DOB: 01/12/1941, 70 y.o.   MRN: [id number]  HPI  The pt has been seen by Dr Isabel Caprice and he has tesdted positive for bladder cancer cells but plans to be seen again in 6 months Back pain is stable CAD stable HTN stable Having severe pain in his right knee   Review of Systems  Constitutional: Negative for fever and fatigue.  HENT: Negative for hearing loss, congestion, neck pain and postnasal drip.   Eyes: Negative for discharge, redness and visual disturbance.  Respiratory: Negative for cough, shortness of breath and wheezing.   Cardiovascular: Negative for leg swelling.  Gastrointestinal: Negative for abdominal pain, constipation and abdominal distention.  Genitourinary: Negative for urgency and frequency.  Musculoskeletal: Negative for joint swelling and arthralgias.  Skin: Negative for color change and rash.  Neurological: Negative for weakness and light-headedness.  Hematological: Negative for adenopathy.  Psychiatric/Behavioral: Negative for behavioral problems.   Past Medical History  Diagnosis Date  . Esophagitis   . Colon polyps   . History of bladder cancer   . Esophagus, Barrett's   . Stenosis of esophagus   . Localized osteoarthrosis, lower leg   . Malaise and fatigue   . Hypothyroidism   . Hypertension   . Anemia   . B12 deficiency anemia   . GERD (gastroesophageal reflux disease)   . Hyperlipidemia   . Depression   . Allergy   . CAD (coronary artery disease)   . COPD (chronic obstructive pulmonary disease)   . Low back pain     History   Social History  . Marital Status: Married    Spouse Name: N/A    Number of Children: N/A  . Years of Education: N/A   Occupational History  . Not on file.   Social History Main Topics  . Smoking status: Former Smoker    Quit date: 03/30/1973  . Smokeless tobacco: Not on file   Comment: quit in 1977  . Alcohol Use: No  . Drug Use: No  . Sexually Active:  Yes   Other Topics Concern  . Not on file   Social History Narrative  . No narrative on file    Past Surgical History  Procedure Date  . Coronary artery bypass graft     x4  . Cervical discectomy     ACDF  . Lumbar laminectomy     and fusion  . Bladder cancer   . Popliteal synovial cyst excision   . Knee arthroscopy     Family History  Problem Relation Age of Onset  . Coronary artery disease    . Cancer Mother     melanoma  . Stroke Father   . Hypertension Father     No Known Allergies  Current Outpatient Prescriptions on File Prior to Visit  Medication Sig Dispense Refill  . aspirin 81 MG tablet Take 81 mg by mouth daily.        [name] esomeprazole (NEXIUM) 40 MG capsule Take 1 capsule (40 mg total) by mouth 2 (two) times daily.  60 capsule  11  . etodolac (LODINE) 300 MG capsule TAKE ONE CAPSULE BY MOUTH EVERY DAY  30 capsule  6  . ezetimibe-simvastatin (VYTORIN) 10-40 MG per tablet Take 1 tablet by mouth at bedtime.        . fexofenadine (ALLEGRA) 180 MG tablet Take 180 mg by mouth daily.        . folic acid (  FOLVITE) 400 MCG tablet Take 400 mcg by mouth daily.        . isosorbide mononitrate (IMDUR) 60 MG 24 hr tablet TAKE ONE TABLET BY MOUTH TWICE DAILY  60 tablet  6  . losartan (COZAAR) 50 MG tablet Take 50 mg by mouth daily.        . metoCLOPramide (REGLAN) 10 MG tablet Take 10 mg by mouth 4 (four) times daily.        . nebivolol (BYSTOLIC) 10 MG tablet Take 5 mg by mouth daily.       . nitroGLYCERIN (NITROSTAT) 0.4 MG SL tablet Place 0.4 mg under the tongue every 5 (five) minutes as needed.        . Olopatadine HCl (PATADAY) 0.2 % SOLN Apply 1 drop to eye daily.        [name] oxyCODONE-acetaminophen (PERCOCET) 10-325 MG per tablet Take 1 tablet by mouth every 6 (six) hours as needed for pain.  60 tablet  0  . oxyCODONE-acetaminophen (PERCOCET) 10-325 MG per tablet Take 1 tablet by mouth every 6 (six) hours as needed for pain.  60 tablet  0  . oxyCODONE-acetaminophen  (PERCOCET) 10-325 MG per tablet Take 1 tablet by mouth 2 (two) times daily as needed.        [name] rOPINIRole (REQUIP) 2 MG tablet TAKE ONE TABLET BY MOUTH EVERY DAY  30 tablet  6  . venlafaxine (EFFEXOR) 75 MG tablet Take 1 tablet (75 mg total) by mouth 2 (two) times daily.  60 tablet  6   Current Facility-Administered Medications on File Prior to Visit  Medication Dose Route Frequency Provider Last Rate Last Dose  . 0.9 %  sodium chloride infusion  500 mL Intravenous Continuous Louis Meckel, MD      . cyanocobalamin ((VITAMIN B-12)) injection 1,000 mcg  1,000 mcg Intramuscular Q30 days Carrie Mew   1,000 mcg at 11/05/10 0954  . methylPREDNISolone acetate (DEPO-MEDROL) injection 20 mg  20 mg Intra-articular Once Carrie Mew        BP 124/74  Pulse 72  Temp 98.2 F (36.8 C)  Resp 16  Ht 5\' 9"  (1.753 m)  Wt 192 lb (87.091 kg)  BMI 28.35 kg/m2       Objective:   Physical Exam  Nursing note and vitals reviewed. Constitutional: He appears well-developed and well-nourished.  HENT:  Head: Normocephalic and atraumatic.  Eyes: Conjunctivae are normal. Pupils are equal, round, and reactive to light.  Neck: Normal range of motion. Neck supple.  Cardiovascular: Normal rate and regular rhythm.   Pulmonary/Chest: Effort normal and breath sounds normal.  Abdominal: Soft. Bowel sounds are normal.  Musculoskeletal:       Knee pain in right knee  Skin: Skin is warm and dry.          Assessment & Plan:   Patient is seen for followup of hypertension which is stable gastroesophageal reflux is stable osteoarthritis is worsened with significant knee pain on the right knee.  Patient was informed of risks and benefits of steroid injection and he agreed to a steroid injection in his right knee.  Patient's lipids and the med have been monitored recently and we will be we'll pursue that again in 2-3 months for a followup with fasting blood work.  He also required a refill of  his Percocet for chronic pain   Informed consent obtained and the patient's knee was prepped with betadine. Local anesthesia was obtained with topical spray. Then 40  mg of Depo-Medrol and 1/2 cc of lidocaine was injected into the joint space. The patient tolerated the procedure without complications. Post injection care discussed with patient.

## 2010-12-31 NOTE — Patient Instructions (Signed)
The patient is instructed to continue all medications as prescribed. Schedule followup with check out clerk upon leaving the clinic You have received a steroid injection into a joint space. It will take up to 48 hours before you notice a difference in the pain in the joint. For the next few hours keep ice on the site of the injection. Do not exert the injected joint for the next 24 hours.  

## 2011-02-23 ENCOUNTER — Ambulatory Visit (INDEPENDENT_AMBULATORY_CARE_PROVIDER_SITE_OTHER): Payer: Medicare Other | Admitting: Internal Medicine

## 2011-02-23 ENCOUNTER — Other Ambulatory Visit: Payer: Self-pay | Admitting: Internal Medicine

## 2011-02-23 DIAGNOSIS — D518 Other vitamin B12 deficiency anemias: Secondary | ICD-10-CM

## 2011-02-23 DIAGNOSIS — D519 Vitamin B12 deficiency anemia, unspecified: Secondary | ICD-10-CM

## 2011-02-23 MED ORDER — CYANOCOBALAMIN 1000 MCG/ML IJ SOLN
1000.0000 ug | INTRAMUSCULAR | Status: DC
  Administered 2011-02-23: 1000 ug via INTRAMUSCULAR

## 2011-03-13 ENCOUNTER — Ambulatory Visit (INDEPENDENT_AMBULATORY_CARE_PROVIDER_SITE_OTHER): Payer: Medicare Other | Admitting: Internal Medicine

## 2011-03-13 ENCOUNTER — Telehealth: Payer: Self-pay | Admitting: *Deleted

## 2011-03-13 ENCOUNTER — Encounter: Payer: Self-pay | Admitting: Internal Medicine

## 2011-03-13 DIAGNOSIS — I10 Essential (primary) hypertension: Secondary | ICD-10-CM

## 2011-03-13 DIAGNOSIS — E785 Hyperlipidemia, unspecified: Secondary | ICD-10-CM

## 2011-03-13 DIAGNOSIS — I2510 Atherosclerotic heart disease of native coronary artery without angina pectoris: Secondary | ICD-10-CM

## 2011-03-13 DIAGNOSIS — J301 Allergic rhinitis due to pollen: Secondary | ICD-10-CM

## 2011-03-13 MED ORDER — OXYCODONE-ACETAMINOPHEN 10-325 MG PO TABS: 1.0000 | ORAL_TABLET | Freq: Two times a day (BID) | ORAL | Status: DC | PRN

## 2011-03-13 MED ORDER — SOLIFENACIN SUCCINATE 5 MG PO TABS: 10.0000 mg | ORAL_TABLET | Freq: Every day | ORAL | Status: DC

## 2011-03-13 NOTE — Progress Notes (Signed)
Subjective:    Patient ID: [name], male    DOB: [birth year], 71 y.o.   MRN: [id number]  HPI Extensive visit talking about the loss of his wife filled with grief pain associated with the loss of his wife and his function level.  Blood pressure stable on his current medication no chest pain reported osteoarthritis and back pain controlled with his current regimen of Percocet.  He has a chief complaint of persistent allergic rhinitis he states that the nasal corticosteroids given to her last visit did help   Review of Systems  Constitutional: Negative for fever and fatigue.  HENT: Negative for hearing loss, congestion, neck pain and postnasal drip.   Eyes: Negative for discharge, redness and visual disturbance.  Respiratory: Negative for cough, shortness of breath and wheezing.   Cardiovascular: Negative for leg swelling.  Gastrointestinal: Negative for abdominal pain, constipation and abdominal distention.  Genitourinary: Negative for urgency and frequency.  Musculoskeletal: Negative for joint swelling and arthralgias.  Skin: Negative for color change and rash.  Neurological: Negative for weakness and light-headedness.  Hematological: Negative for adenopathy.  Psychiatric/Behavioral: Negative for behavioral problems.   Past Medical History  Diagnosis Date  . Esophagitis   . Colon polyps   . History of bladder cancer   . Esophagus, Barrett's   . Stenosis of esophagus   . Localized osteoarthrosis, lower leg   . Malaise and fatigue   . Hypothyroidism   . Hypertension   . Anemia   . B12 deficiency anemia   . GERD (gastroesophageal reflux disease)   . Hyperlipidemia   . Depression   . Allergy   . CAD (coronary artery disease)   . COPD (chronic obstructive pulmonary disease)   . Low back pain     History   Social History  . Marital Status: Married    Spouse Name: N/A    Number of Children: N/A  . Years of Education: N/A   Occupational History  . Not on file.    Social History Main Topics  . Smoking status: Former Smoker    Quit date: 03/30/1973  . Smokeless tobacco: Not on file   Comment: quit in 1977  . Alcohol Use: No  . Drug Use: No  . Sexually Active: Yes   Other Topics Concern  . Not on file   Social History Narrative  . No narrative on file    Past Surgical History  Procedure Date  . Coronary artery bypass graft     x4  . Cervical discectomy     ACDF  . Lumbar laminectomy     and fusion  . Bladder cancer   . Popliteal synovial cyst excision   . Knee arthroscopy     Family History  Problem Relation Age of Onset  . Coronary artery disease    . Cancer Mother     melanoma  . Stroke Father   . Hypertension Father     No Known Allergies  Current Outpatient Prescriptions on File Prior to Visit  Medication Sig Dispense Refill  . aspirin 81 MG tablet Take 81 mg by mouth daily.        [name] esomeprazole (NEXIUM) 40 MG capsule Take 1 capsule (40 mg total) by mouth 2 (two) times daily.  60 capsule  11  . etodolac (LODINE) 300 MG capsule Take 1 capsule (300 mg total) by mouth every 8 (eight) hours.  60 capsule  6  . ezetimibe-simvastatin (VYTORIN) 10-40 MG per tablet Take 1 tablet  by mouth at bedtime.        . fexofenadine (ALLEGRA) 180 MG tablet Take 180 mg by mouth daily.        . folic acid (FOLVITE) 400 MCG tablet Take 400 mcg by mouth daily.        . isosorbide mononitrate (IMDUR) 60 MG 24 hr tablet TAKE ONE TABLET BY MOUTH TWICE DAILY  60 tablet  6  . losartan (COZAAR) 50 MG tablet Take 50 mg by mouth daily.        . metoCLOPramide (REGLAN) 10 MG tablet Take 10 mg by mouth 4 (four) times daily.        . nebivolol (BYSTOLIC) 10 MG tablet Take 5 mg by mouth daily.       . nitroGLYCERIN (NITROSTAT) 0.4 MG SL tablet Place 0.4 mg under the tongue every 5 (five) minutes as needed.        . Olopatadine HCl (PATADAY) 0.2 % SOLN Apply 1 drop to eye daily.        [name] rOPINIRole (REQUIP) 2 MG tablet TAKE ONE TABLET BY MOUTH EVERY DAY   30 tablet  6  . venlafaxine (EFFEXOR) 75 MG tablet Take 1 tablet (75 mg total) by mouth 2 (two) times daily.  60 tablet  6  . DISCONTD: oxyCODONE-acetaminophen (PERCOCET) 10-325 MG per tablet Take 1 tablet by mouth 2 (two) times daily as needed.        [name] DISCONTD: oxyCODONE-acetaminophen (PERCOCET) 10-325 MG per tablet Take 1 tablet by mouth every 6 (six) hours as needed for pain.  60 tablet  0  . DISCONTD: oxyCODONE-acetaminophen (PERCOCET) 10-325 MG per tablet Take 1 tablet by mouth every 6 (six) hours as needed for pain.  60 tablet  0   Current Facility-Administered Medications on File Prior to Visit  Medication Dose Route Frequency Provider Last Rate Last Dose  . 0.9 %  sodium chloride infusion  500 mL Intravenous Continuous Louis Meckel, MD      . cyanocobalamin ((VITAMIN B-12)) injection 1,000 mcg  1,000 mcg Intramuscular Q30 days Carrie Mew, MD   1,000 mcg at 11/05/10 0954  . cyanocobalamin ((VITAMIN B-12)) injection 1,000 mcg  1,000 mcg Intramuscular Q30 days Carrie Mew, MD   1,000 mcg at 02/23/11 1237  . methylPREDNISolone acetate (DEPO-MEDROL) injection 20 mg  20 mg Intra-articular Once Carrie Mew, MD      . methylPREDNISolone acetate (DEPO-MEDROL) injection 40 mg  40 mg Intra-articular Once Carrie Mew, MD        BP 120/70  Pulse 72  Temp 98.3 F (36.8 C)  Resp 16  Ht 5\' 9"  (1.753 m)  Wt 196 lb (88.905 kg)  BMI 28.94 kg/m2       Objective:   Physical Exam  Constitutional: He appears well-developed and well-nourished.  HENT:  Head: Normocephalic and atraumatic.  Eyes: Conjunctivae are normal. Pupils are equal, round, and reactive to light.  Neck: Normal range of motion. Neck supple.  Cardiovascular: Normal rate and regular rhythm.   Pulmonary/Chest: Effort normal and breath sounds normal.  Abdominal: Soft. Bowel sounds are normal.          Assessment & Plan:  We've counseled about the loss of his wife.  Blood pressure  stable pertinent medications.  No apparent chest pain.  Allergic rhinitis we'll start a nasal corticosteroid twice daily

## 2011-03-13 NOTE — Telephone Encounter (Signed)
Error

## 2011-03-13 NOTE — Patient Instructions (Signed)
The patient is instructed to continue all medications as prescribed. Schedule followup with check out clerk upon leaving the clinic  

## 2011-04-07 ENCOUNTER — Ambulatory Visit (INDEPENDENT_AMBULATORY_CARE_PROVIDER_SITE_OTHER): Payer: Medicare Other | Admitting: Internal Medicine

## 2011-04-07 DIAGNOSIS — D518 Other vitamin B12 deficiency anemias: Secondary | ICD-10-CM

## 2011-04-07 DIAGNOSIS — D519 Vitamin B12 deficiency anemia, unspecified: Secondary | ICD-10-CM

## 2011-04-07 MED ORDER — CYANOCOBALAMIN 1000 MCG/ML IJ SOLN
1000.0000 ug | INTRAMUSCULAR | Status: DC
  Administered 2011-04-07: 1000 ug via INTRAMUSCULAR

## 2011-05-06 ENCOUNTER — Other Ambulatory Visit: Payer: Self-pay | Admitting: *Deleted

## 2011-05-06 DIAGNOSIS — M1710 Unilateral primary osteoarthritis, unspecified knee: Secondary | ICD-10-CM

## 2011-05-06 MED ORDER — ETODOLAC 300 MG PO CAPS: 300.0000 mg | ORAL_CAPSULE | Freq: Three times a day (TID) | ORAL | Status: DC

## 2011-05-13 ENCOUNTER — Ambulatory Visit (INDEPENDENT_AMBULATORY_CARE_PROVIDER_SITE_OTHER): Payer: Medicare Other | Admitting: Internal Medicine

## 2011-05-13 DIAGNOSIS — D518 Other vitamin B12 deficiency anemias: Secondary | ICD-10-CM

## 2011-05-13 DIAGNOSIS — D519 Vitamin B12 deficiency anemia, unspecified: Secondary | ICD-10-CM

## 2011-05-13 MED ORDER — CYANOCOBALAMIN 1000 MCG/ML IJ SOLN
1000.0000 ug | INTRAMUSCULAR | Status: AC
  Administered 2011-05-13: 1000 ug via INTRAMUSCULAR

## 2011-05-18 ENCOUNTER — Other Ambulatory Visit: Payer: Self-pay | Admitting: *Deleted

## 2011-05-18 MED ORDER — ISOSORBIDE MONONITRATE ER 60 MG PO TB24: 60.0000 mg | ORAL_TABLET | Freq: Every day | ORAL | Status: DC

## 2011-05-18 MED ORDER — ROPINIROLE HCL 2 MG PO TABS: 2.0000 mg | ORAL_TABLET | Freq: Every day | ORAL | Status: DC

## 2011-06-11 ENCOUNTER — Ambulatory Visit: Payer: Medicare Other | Admitting: Internal Medicine

## 2011-06-13 ENCOUNTER — Other Ambulatory Visit: Payer: Self-pay | Admitting: Internal Medicine

## 2011-06-16 NOTE — Telephone Encounter (Signed)
Dr. Jenkins pt. 

## 2011-06-18 ENCOUNTER — Encounter: Payer: Self-pay | Admitting: Family Medicine

## 2011-06-18 ENCOUNTER — Telehealth: Payer: Self-pay | Admitting: Family Medicine

## 2011-06-18 ENCOUNTER — Ambulatory Visit (INDEPENDENT_AMBULATORY_CARE_PROVIDER_SITE_OTHER): Payer: Medicare Other | Admitting: Family Medicine

## 2011-06-18 VITALS — BP 138/86 | HR 68 | Resp 16 | Wt 192.0 lb

## 2011-06-18 DIAGNOSIS — R42 Dizziness and giddiness: Secondary | ICD-10-CM

## 2011-06-18 DIAGNOSIS — D518 Other vitamin B12 deficiency anemias: Secondary | ICD-10-CM

## 2011-06-18 DIAGNOSIS — G4762 Sleep related leg cramps: Secondary | ICD-10-CM

## 2011-06-18 MED ORDER — CYANOCOBALAMIN 1000 MCG/ML IJ SOLN
1000.0000 ug | Freq: Once | INTRAMUSCULAR | Status: AC
  Administered 2011-06-18: 1000 ug via INTRAMUSCULAR

## 2011-06-18 MED ORDER — MECLIZINE HCL 12.5 MG PO TABS: 12.5000 mg | ORAL_TABLET | Freq: Three times a day (TID) | ORAL | Status: AC | PRN

## 2011-06-18 NOTE — Patient Instructions (Addendum)
Insomnia Insomnia is frequent trouble falling and/or staying asleep. Insomnia can be a long term problem or a short term problem. Both are common. Insomnia can be a short term problem when the wakefulness is related to a certain stress or worry. Long term insomnia is often related to ongoing stress during waking hours and/or poor sleeping habits. Overtime, sleep deprivation itself can make the problem worse. Every little thing feels more severe because you are overtired and your ability to cope is decreased. CAUSES   Stress, anxiety, and depression.   Poor sleeping habits.   Distractions such as TV in the bedroom.   Naps close to bedtime.   Engaging in emotionally charged conversations before bed.   Technical reading before sleep.   Alcohol and other sedatives. They may make the problem worse. They can hurt normal sleep patterns and normal dream activity.   Stimulants such as caffeine for several hours prior to bedtime.   Pain syndromes and shortness of breath can cause insomnia.   Exercise late at night.   Changing time zones may cause sleeping problems (jet lag).  It is sometimes helpful to have someone observe your sleeping patterns. They should look for periods of not breathing during the night (sleep apnea). They should also look to see how long those periods last. If you live alone or observers are uncertain, you can also be observed at a sleep clinic where your sleep patterns will be professionally monitored. Sleep apnea requires a checkup and treatment. Give your caregivers your medical history. Give your caregivers observations your family has made about your sleep.  SYMPTOMS   Not feeling rested in the morning.   Anxiety and restlessness at bedtime.   Difficulty falling and staying asleep.  TREATMENT   Your caregiver may prescribe treatment for an underlying medical disorders. Your caregiver can give advice or help if you are using alcohol or other drugs for  self-medication. Treatment of underlying problems will usually eliminate insomnia problems.   Medications can be prescribed for short time use. They are generally not recommended for lengthy use.   Over-the-counter sleep medicines are not recommended for lengthy use. They can be habit forming.   You can promote easier sleeping by making lifestyle changes such as:   Using relaxation techniques that help with breathing and reduce muscle tension.   Exercising earlier in the day.   Changing your diet and the time of your last meal. No night time snacks.   Establish a regular time to go to bed.   Counseling can help with stressful problems and worry.   Soothing music and white noise may be helpful if there are background noises you cannot remove.   Stop tedious detailed work at least one hour before bedtime.  HOME CARE INSTRUCTIONS   Keep a diary. Inform your caregiver about your progress. This includes any medication side effects. See your caregiver regularly. Take note of:   Times when you are asleep.   Times when you are awake during the night.   The quality of your sleep.   How you feel the next day.  This information will help your caregiver care for you.  Get out of bed if you are still awake after 15 minutes. Read or do some quiet activity. Keep the lights down. Wait until you feel sleepy and go back to bed.   Keep regular sleeping and waking hours. Avoid naps.   Exercise regularly.   Avoid distractions at bedtime. Distractions include watching television or engaging   in any intense or detailed activity like attempting to balance the household checkbook.   Develop a bedtime ritual. Keep a familiar routine of bathing, brushing your teeth, climbing into bed at the same time each night, listening to soothing music. Routines increase the success of falling to sleep faster.   Use relaxation techniques. This can be using breathing and muscle tension release routines. It can  also include visualizing peaceful scenes. You can also help control troubling or intruding thoughts by keeping your mind occupied with boring or repetitive thoughts like the old concept of counting sheep. You can make it more creative like imagining planting one beautiful flower after another in your backyard garden.   During your day, work to eliminate stress. When this is not possible use some of the previous suggestions to help reduce the anxiety that accompanies stressful situations.  MAKE SURE YOU:   Understand these instructions.   Will watch your condition.   Will get help right away if you are not doing well or get worse.  Document Released: 01/03/2000 Document Revised: 12/25/2010 Document Reviewed: 02/02/2007 [facility] Patient Information 2012 [place], [place].  Leg Cramps Leg cramps that occur during exercise can be caused by poor circulation or dehydration. However, muscle cramps that occur at rest or during the night are usually not due to any serious medical problem. Heat cramps may cause muscle spasms during hot weather.  CAUSES There is no clear cause for muscle cramps. However, dehydration may be a factor for those who do not drink enough fluids and those who exercise in the heat. Imbalances in the level of sodium, potassium, calcium or magnesium in the muscle tissue may also be a factor. Some medications, such as water pills (diuretics), may cause loss of chemicals that the body needs (like sodium and potassium) and cause muscle cramps. TREATMENT   Make sure your diet has enough fluids and essential minerals for the muscle to work normally.   Avoid strenuous exercise for several days if you have been having frequent leg cramps.   Stretch and massage the cramped muscle for several minutes.   Some medicines may be helpful in some patients with night cramps. Only take over-the-counter or prescription medicines as directed by your caregiver.  SEEK IMMEDIATE MEDICAL CARE IF:    Your leg cramps become worse.   Your foot becomes cold, numb, or blue.  Document Released: 02/13/2004 Document Revised: 12/25/2010 Document Reviewed: 01/31/2008 [facility] Patient Information 2012 [place], [place].

## 2011-06-18 NOTE — Telephone Encounter (Signed)
Pt here to see md

## 2011-06-18 NOTE — Progress Notes (Signed)
Subjective:    Patient ID: [name], male    DOB: 09/28/1940, 71 y.o.   MRN: [id number]  HPI  Patient is seen with vague dizziness. Onset Monday. History of similar symptoms in past. By description this is more disequilibrium. Worse with head movement to the right or left side. No syncope or presyncope. He had no nausea or vomiting. No speech changes. No dysphagia. No focal weakness. No alleviating factors. Denies nasal congestion. No headaches. He does not recall what has been treated with in past for similar flareups. No hearing changes.  Frequent bilateral leg cramps. Occasional arm cramps. Generally drinks plenty of fluids. Recent labs per cardiologist reportedly normal. No history of electrolyte disturbance.  Poor sleep quality. Frequent early morning awakening. Wife passed away from motor vehicle accident back in February. He has good support. Overall coping fairly well. No alcohol use. Frequently wakes up at night to eat.  Also has some nocturia which disrupts his sleep. Restless legs treated with Requip  Past Medical History  Diagnosis Date  . Esophagitis   . Colon polyps   . History of bladder cancer   . Esophagus, Barrett's   . Stenosis of esophagus   . Localized osteoarthrosis, lower leg   . Malaise and fatigue   . Hypothyroidism   . Hypertension   . Anemia   . B12 deficiency anemia   . GERD (gastroesophageal reflux disease)   . Hyperlipidemia   . Depression   . Allergy   . CAD (coronary artery disease)   . COPD (chronic obstructive pulmonary disease)   . Low back pain    Past Surgical History  Procedure Date  . Coronary artery bypass graft     x4  . Cervical discectomy     ACDF  . Lumbar laminectomy     and fusion  . Bladder cancer   . Popliteal synovial cyst excision   . Knee arthroscopy     reports that he quit smoking about 38 years ago. He does not have any smokeless tobacco history on file. He reports that he does not drink alcohol or use illicit  drugs. family history includes Cancer in his mother; Coronary artery disease in an unspecified family member; Hypertension in his father; and Stroke in his father. No Known Allergies    Review of Systems  Constitutional: Negative for fever, activity change, appetite change and unexpected weight change.  HENT: Negative for trouble swallowing.   Eyes: Negative for visual disturbance.  Respiratory: Negative for cough and shortness of breath.   Cardiovascular: Negative for chest pain, palpitations and leg swelling.  Gastrointestinal: Negative for abdominal pain.  Genitourinary: Negative for dysuria.  Musculoskeletal: Negative for back pain.  Skin: Negative for rash.  Neurological: Positive for dizziness.  Psychiatric/Behavioral: Positive for sleep disturbance. Negative for suicidal ideas, confusion and agitation.       Objective:   Physical Exam  Constitutional: He is oriented to person, place, and time. He appears well-developed and well-nourished.  HENT:  Mouth/Throat: Oropharynx is clear and moist.  Eyes: Pupils are equal, round, and reactive to light.  Neck: Neck supple. No thyromegaly present.  Cardiovascular: Normal rate and regular rhythm.   Pulmonary/Chest: Effort normal and breath sounds normal. No respiratory distress. He has no wheezes. He has no rales.  Musculoskeletal: He exhibits no edema.  Lymphadenopathy:    He has no cervical adenopathy.  Neurological: He is alert and oriented to person, place, and time. No cranial nerve deficit.  Skin: No rash  noted.  Psychiatric: He has a normal mood and affect. His behavior is normal. Judgment and thought content normal.          Assessment & Plan:  #1 dizziness. By description this sounds more like vertigo which usually very transient. No associated red flags. Consider vestibular rehabilitation if symptoms persist. Short-term followup meclozine 12.5 mg every 8 hours when necessary #2 leg cramps with history of frequent  similar symptoms in past. Increase hydration. Consider B complex multivitamin. #3 chronic insomnia. Sleep hygiene discussed with handout given. #4 B12 deficiency. Patient requesting injection this was given.

## 2011-06-18 NOTE — Telephone Encounter (Signed)
Pulled from Triage vmail, pt called at 9:26. Would like to be seen today and also get B12 shot. Pt was seen by Dr. Caryl Never as a walk-in.

## 2011-07-17 ENCOUNTER — Encounter: Payer: Self-pay | Admitting: Internal Medicine

## 2011-07-17 ENCOUNTER — Ambulatory Visit (INDEPENDENT_AMBULATORY_CARE_PROVIDER_SITE_OTHER): Payer: Medicare Other | Admitting: Internal Medicine

## 2011-07-17 VITALS — BP 130/80 | HR 72 | Temp 98.6°F | Resp 16 | Ht 69.0 in | Wt 194.0 lb

## 2011-07-17 DIAGNOSIS — M549 Dorsalgia, unspecified: Secondary | ICD-10-CM

## 2011-07-17 DIAGNOSIS — L570 Actinic keratosis: Secondary | ICD-10-CM

## 2011-07-17 DIAGNOSIS — G589 Mononeuropathy, unspecified: Secondary | ICD-10-CM

## 2011-07-17 DIAGNOSIS — I10 Essential (primary) hypertension: Secondary | ICD-10-CM

## 2011-07-17 DIAGNOSIS — R4589 Other symptoms and signs involving emotional state: Secondary | ICD-10-CM

## 2011-07-17 DIAGNOSIS — E538 Deficiency of other specified B group vitamins: Secondary | ICD-10-CM

## 2011-07-17 DIAGNOSIS — F329 Major depressive disorder, single episode, unspecified: Secondary | ICD-10-CM

## 2011-07-17 DIAGNOSIS — G629 Polyneuropathy, unspecified: Secondary | ICD-10-CM

## 2011-07-17 MED ORDER — CYANOCOBALAMIN 1000 MCG/ML IJ SOLN
1000.0000 ug | INTRAMUSCULAR | Status: DC
  Administered 2011-07-17: 1000 ug via INTRAMUSCULAR

## 2011-07-17 MED ORDER — VENLAFAXINE HCL 75 MG PO TABS: 75.0000 mg | ORAL_TABLET | Freq: Every day | ORAL | Status: DC

## 2011-07-17 NOTE — Progress Notes (Signed)
Subjective:    Patient ID: [name], male    DOB: [birth year], 71 y.o.   MRN: [id number]  HPI Presents for followup of hypertension gastroesophageal reflux osteoarthritis.  He is dealing with grief and depression over the loss of his wife.  He is also dealing with loneliness and some mild despondency he denies any suicidal homicidal ideation.  Patient has a history of sun exposure and multiple actinic keratosis has been treated in the past he complains today of the keratoses on his left ear and on his   Review of Systems  Constitutional: Negative for fever and fatigue.  HENT: Negative for hearing loss, congestion, neck pain and postnasal drip.   Eyes: Negative for discharge, redness and visual disturbance.  Respiratory: Negative for cough, shortness of breath and wheezing.   Cardiovascular: Negative for leg swelling.  Gastrointestinal: Negative for abdominal pain, constipation and abdominal distention.  Genitourinary: Negative for urgency and frequency.  Musculoskeletal: Negative for joint swelling and arthralgias.  Skin: Negative for color change and rash.  Neurological: Negative for weakness and light-headedness.  Hematological: Negative for adenopathy.  Psychiatric/Behavioral: Negative for behavioral problems.   Past Medical History  Diagnosis Date  . Esophagitis   . Colon polyps   . History of bladder cancer   . Esophagus, Barrett's   . Stenosis of esophagus   . Localized osteoarthrosis, lower leg   . Malaise and fatigue   . Hypothyroidism   . Hypertension   . Anemia   . B12 deficiency anemia   . GERD (gastroesophageal reflux disease)   . Hyperlipidemia   . Depression   . Allergy   . CAD (coronary artery disease)   . COPD (chronic obstructive pulmonary disease)   . Low back pain     History   Social History  . Marital Status: Married    Spouse Name: N/A    Number of Children: N/A  . Years of Education: N/A   Occupational History  . Not on file.     Social History Main Topics  . Smoking status: Former Smoker    Quit date: 03/30/1973  . Smokeless tobacco: Not on file   Comment: quit in 1977  . Alcohol Use: No  . Drug Use: No  . Sexually Active: Yes   Other Topics Concern  . Not on file   Social History Narrative  . No narrative on file    Past Surgical History  Procedure Date  . Coronary artery bypass graft     x4  . Cervical discectomy     ACDF  . Lumbar laminectomy     and fusion  . Bladder cancer   . Popliteal synovial cyst excision   . Knee arthroscopy     Family History  Problem Relation Age of Onset  . Coronary artery disease    . Cancer Mother     melanoma  . Stroke Father   . Hypertension Father     No Known Allergies  Current Outpatient Prescriptions on File Prior to Visit  Medication Sig Dispense Refill  . gabapentin (NEURONTIN) 100 MG capsule Take 100 mg by mouth 3 (three) times daily. titrating up to 1 bid and 1-2 at bedtime      . aspirin 81 MG tablet Take 81 mg by mouth daily.        [name] esomeprazole (NEXIUM) 40 MG capsule Take 1 capsule (40 mg total) by mouth 2 (two) times daily.  60 capsule  11  . etodolac (LODINE) 300  MG capsule Take 1 capsule (300 mg total) by mouth every 8 (eight) hours.  60 capsule  6  . ezetimibe-simvastatin (VYTORIN) 10-40 MG per tablet Take 1 tablet by mouth at bedtime.        . fexofenadine (ALLEGRA) 180 MG tablet TAKE ONE TABLET BY MOUTH EVERY DAY  30 tablet  6  . folic acid (FOLVITE) 400 MCG tablet Take 400 mcg by mouth daily.        . isosorbide mononitrate (IMDUR) 60 MG 24 hr tablet Take 1 tablet (60 mg total) by mouth daily.  60 tablet  6  . losartan (COZAAR) 50 MG tablet Take 50 mg by mouth daily.        . metoCLOPramide (REGLAN) 10 MG tablet Take 10 mg by mouth 4 (four) times daily.        . nebivolol (BYSTOLIC) 10 MG tablet Take 5 mg by mouth daily.       . nitroGLYCERIN (NITROSTAT) 0.4 MG SL tablet Place 0.4 mg under the tongue every 5 (five) minutes as  needed.        . Olopatadine HCl (PATADAY) 0.2 % SOLN Apply 1 drop to eye daily.        [name] oxyCODONE-acetaminophen (PERCOCET) 10-325 MG per tablet Take 1 tablet by mouth 2 (two) times daily as needed.  60 tablet  0  . oxyCODONE-acetaminophen (PERCOCET) 10-325 MG per tablet Take 1 tablet by mouth 2 (two) times daily as needed.  60 tablet  0  . rOPINIRole (REQUIP) 2 MG tablet Take 1 tablet (2 mg total) by mouth at bedtime.  90 tablet  3  . solifenacin (VESICARE) 5 MG tablet Take 2 tablets (10 mg total) by mouth daily.      [name] venlafaxine (EFFEXOR) 75 MG tablet TAKE ONE TABLET BY MOUTH EVERY DAY  30 tablet  6   Current Facility-Administered Medications on File Prior to Visit  Medication Dose Route Frequency Provider Last Rate Last Dose  . methylPREDNISolone acetate (DEPO-MEDROL) injection 20 mg  20 mg Intra-articular Once Stacie Glaze, MD      . methylPREDNISolone acetate (DEPO-MEDROL) injection 40 mg  40 mg Intra-articular Once Stacie Glaze, MD      . DISCONTD: 0.9 %  sodium chloride infusion  500 mL Intravenous Continuous Louis Meckel, MD      . DISCONTD: cyanocobalamin ((VITAMIN B-12)) injection 1,000 mcg  1,000 mcg Intramuscular Q30 days Stacie Glaze, MD   1,000 mcg at 11/05/10 0954  . DISCONTD: cyanocobalamin ((VITAMIN B-12)) injection 1,000 mcg  1,000 mcg Intramuscular Q30 days Stacie Glaze, MD   1,000 mcg at 02/23/11 1237  . DISCONTD: cyanocobalamin ((VITAMIN B-12)) injection 1,000 mcg  1,000 mcg Intramuscular Q30 days Stacie Glaze, MD   1,000 mcg at 04/07/11 1231    BP 130/80  Pulse 72  Temp 98.6 F (37 C)  Resp 16  Ht 5\' 9"  (1.753 m)  Wt 194 lb (87.998 kg)  BMI 28.65 kg/m2       Objective:   Physical Exam  Nursing note and vitals reviewed. Constitutional: He is oriented to person, place, and time. He appears well-developed and well-nourished.  HENT:  Head: Normocephalic and atraumatic.  Eyes: Conjunctivae are normal. Pupils are equal, round, and reactive to light.   Neck: Normal range of motion. Neck supple.  Cardiovascular: Normal rate and regular rhythm.   Pulmonary/Chest: Effort normal and breath sounds normal.  Abdominal: Soft. Bowel sounds are normal.  Musculoskeletal: Normal range  of motion.  Neurological: He is alert and oriented to person, place, and time.  Skin: Skin is warm and dry.          Assessment & Plan:  Stable blood pressure with no evident recurrent CAD.  Gastroesophageal reflux is stable on current medications  He is compliant with his Vytorin for his hyperlipidemia and risk reduction for CAD.  He has actinic keratosis require treatment on his left for head and 3 spots on his left ear   Informed consent was obtained in the lesions were treated for 60 seconds of liquid nitrogen application the patient tolerated the procedure well as procedural care was discussed with the patient and instructions should the lesion reappears contact our office immediately

## 2011-07-17 NOTE — Patient Instructions (Signed)
The patient is instructed to continue all medications as prescribed. Schedule followup with check out clerk upon leaving the clinic  

## 2011-10-16 ENCOUNTER — Ambulatory Visit (INDEPENDENT_AMBULATORY_CARE_PROVIDER_SITE_OTHER): Payer: Medicare Other | Admitting: *Deleted

## 2011-10-16 DIAGNOSIS — D518 Other vitamin B12 deficiency anemias: Secondary | ICD-10-CM

## 2011-10-16 DIAGNOSIS — Z23 Encounter for immunization: Secondary | ICD-10-CM

## 2011-10-16 MED ORDER — CYANOCOBALAMIN 1000 MCG/ML IJ SOLN
1000.0000 ug | Freq: Once | INTRAMUSCULAR | Status: AC
  Administered 2011-10-16: 1000 ug via INTRAMUSCULAR

## 2011-10-26 ENCOUNTER — Telehealth: Payer: Self-pay | Admitting: Internal Medicine

## 2011-10-26 MED ORDER — OXYCODONE-ACETAMINOPHEN 10-325 MG PO TABS: 1.0000 | ORAL_TABLET | Freq: Two times a day (BID) | ORAL | Status: DC | PRN

## 2011-10-26 NOTE — Telephone Encounter (Signed)
Ov in 2 weeks-printed and will call pt and let him know after dr Lovell Sheehan that it is ready for pick up

## 2011-10-26 NOTE — Telephone Encounter (Signed)
Pt called req to get refill oxyCODONE-acetaminophen (PERCOCET) 10-325 MG per tablet. Pls call when ready for pick up. Pt said that his daughter may come to pick up when ready.

## 2011-11-09 ENCOUNTER — Encounter: Payer: Self-pay | Admitting: Internal Medicine

## 2011-11-09 ENCOUNTER — Other Ambulatory Visit: Payer: Self-pay | Admitting: *Deleted

## 2011-11-09 ENCOUNTER — Ambulatory Visit (INDEPENDENT_AMBULATORY_CARE_PROVIDER_SITE_OTHER): Payer: Medicare Other | Admitting: Internal Medicine

## 2011-11-09 VITALS — BP 130/80 | HR 72 | Temp 98.6°F | Resp 16 | Ht 69.0 in | Wt 190.0 lb

## 2011-11-09 DIAGNOSIS — M7550 Bursitis of unspecified shoulder: Secondary | ICD-10-CM

## 2011-11-09 DIAGNOSIS — G8929 Other chronic pain: Secondary | ICD-10-CM

## 2011-11-09 DIAGNOSIS — M25569 Pain in unspecified knee: Secondary | ICD-10-CM

## 2011-11-09 DIAGNOSIS — M67919 Unspecified disorder of synovium and tendon, unspecified shoulder: Secondary | ICD-10-CM

## 2011-11-09 DIAGNOSIS — M549 Dorsalgia, unspecified: Secondary | ICD-10-CM

## 2011-11-09 DIAGNOSIS — Z8639 Personal history of other endocrine, nutritional and metabolic disease: Secondary | ICD-10-CM

## 2011-11-09 DIAGNOSIS — M25562 Pain in left knee: Secondary | ICD-10-CM

## 2011-11-09 DIAGNOSIS — M719 Bursopathy, unspecified: Secondary | ICD-10-CM

## 2011-11-09 DIAGNOSIS — M1710 Unilateral primary osteoarthritis, unspecified knee: Secondary | ICD-10-CM

## 2011-11-09 MED ORDER — OXYCODONE-ACETAMINOPHEN 10-325 MG PO TABS: 1.0000 | ORAL_TABLET | Freq: Two times a day (BID) | ORAL | Status: DC | PRN

## 2011-11-09 MED ORDER — CYANOCOBALAMIN 1000 MCG/ML IJ SOLN
1000.0000 ug | INTRAMUSCULAR | Status: DC
  Administered 2011-11-09: 1000 ug via INTRAMUSCULAR

## 2011-11-09 MED ORDER — METHYLPREDNISOLONE ACETATE 40 MG/ML IJ SUSP: 40.0000 mg | Freq: Once | INTRAMUSCULAR | Status: DC

## 2011-11-09 NOTE — Patient Instructions (Signed)
The patient is instructed to continue all medications as prescribed. Schedule followup with check out clerk upon leaving the clinic  

## 2011-11-09 NOTE — Progress Notes (Signed)
Subjective:    Patient ID: [name], male    DOB: 11-08-1940, 71 y.o.   MRN: [id number]  HPI Follow  up chronic pain management On medications for chronic pain and  has chronic knee pain with hx of prior knee injections that has been successful He was on neurotin but could not tolerate it... He has been off the medication and feels betters.[name] He di not feel that this helped the neuropathy    Review of Systems  Constitutional: Negative for fever and fatigue.  HENT: Positive for congestion, rhinorrhea and postnasal drip. Negative for hearing loss and neck pain.   Eyes: Negative for discharge, redness and visual disturbance.  Respiratory: Negative for cough, shortness of breath and wheezing.   Cardiovascular: Positive for leg swelling.  Gastrointestinal: Negative for abdominal pain, constipation and abdominal distention.  Genitourinary: Negative for urgency and frequency.  Musculoskeletal: Positive for myalgias and joint swelling. Negative for arthralgias.  Skin: Negative for color change and rash.  Neurological: Negative for weakness and light-headedness.  Hematological: Negative for adenopathy.  Psychiatric/Behavioral: Negative for behavioral problems.   Past Medical History  Diagnosis Date  . Esophagitis   . Colon polyps   . History of bladder cancer   . Esophagus, Barrett's   . Stenosis of esophagus   . Localized osteoarthrosis, lower leg   . Malaise and fatigue   . Hypothyroidism   . Hypertension   . Anemia   . B12 deficiency anemia   . GERD (gastroesophageal reflux disease)   . Hyperlipidemia   . Depression   . Allergy   . CAD (coronary artery disease)   . COPD (chronic obstructive pulmonary disease)   . Low back pain     History   Social History  . Marital Status: Married    Spouse Name: N/A    Number of Children: N/A  . Years of Education: N/A   Occupational History  . Not on file.   Social History Main Topics  . Smoking status: Former Smoker      Quit date: 03/30/1973  . Smokeless tobacco: Not on file   Comment: quit in 1977  . Alcohol Use: No  . Drug Use: No  . Sexually Active: Yes   Other Topics Concern  . Not on file   Social History Narrative  . No narrative on file    Past Surgical History  Procedure Date  . Coronary artery bypass graft     x4  . Cervical discectomy     ACDF  . Lumbar laminectomy     and fusion  . Bladder cancer   . Popliteal synovial cyst excision   . Knee arthroscopy     Family History  Problem Relation Age of Onset  . Coronary artery disease    . Cancer Mother     melanoma  . Stroke Father   . Hypertension Father     No Known Allergies  Current Outpatient Prescriptions on File Prior to Visit  Medication Sig Dispense Refill  . aspirin 81 MG tablet Take 81 mg by mouth daily.        [name] esomeprazole (NEXIUM) 40 MG capsule Take 1 capsule (40 mg total) by mouth 2 (two) times daily.  60 capsule  11  . etodolac (LODINE) 300 MG capsule Take 1 capsule (300 mg total) by mouth every 8 (eight) hours.  60 capsule  6  . ezetimibe-simvastatin (VYTORIN) 10-40 MG per tablet Take 1 tablet by mouth at bedtime.        [name]  fexofenadine (ALLEGRA) 180 MG tablet TAKE ONE TABLET BY MOUTH EVERY DAY  30 tablet  6  . folic acid (FOLVITE) 400 MCG tablet Take 400 mcg by mouth daily.        . isosorbide mononitrate (IMDUR) 60 MG 24 hr tablet Take 1 tablet (60 mg total) by mouth daily.  60 tablet  6  . losartan (COZAAR) 50 MG tablet Take 50 mg by mouth daily.        . metoCLOPramide (REGLAN) 10 MG tablet Take 10 mg by mouth 4 (four) times daily.        . nebivolol (BYSTOLIC) 10 MG tablet Take 5 mg by mouth daily.       . nitroGLYCERIN (NITROSTAT) 0.4 MG SL tablet Place 0.4 mg under the tongue every 5 (five) minutes as needed.        . Olopatadine HCl (PATADAY) 0.2 % SOLN Apply 1 drop to eye daily.        [name] oxyCODONE-acetaminophen (PERCOCET) 10-325 MG per tablet Take 1 tablet by mouth 2 (two) times daily as needed.   60 tablet  0  . oxyCODONE-acetaminophen (PERCOCET) 10-325 MG per tablet Take 1 tablet by mouth 2 (two) times daily as needed.  60 tablet  0  . rOPINIRole (REQUIP) 2 MG tablet Take 1 tablet (2 mg total) by mouth at bedtime.  90 tablet  3  . solifenacin (VESICARE) 5 MG tablet Take 2 tablets (10 mg total) by mouth daily.      [name] venlafaxine (EFFEXOR) 75 MG tablet Take 1 tablet (75 mg total) by mouth daily.  90 tablet  3   Current Facility-Administered Medications on File Prior to Visit  Medication Dose Route Frequency Provider Last Rate Last Dose  . cyanocobalamin ((VITAMIN B-12)) injection 1,000 mcg  1,000 mcg Intramuscular Q30 days Stacie Glaze, MD   1,000 mcg at 07/17/11 0959  . methylPREDNISolone acetate (DEPO-MEDROL) injection 20 mg  20 mg Intra-articular Once Stacie Glaze, MD      . methylPREDNISolone acetate (DEPO-MEDROL) injection 40 mg  40 mg Intra-articular Once Stacie Glaze, MD        BP 130/80  Pulse 72  Temp 98.6 F (37 C)  Resp 16  Ht 5\' 9"  (1.753 m)  Wt 190 lb (86.183 kg)  BMI 28.06 kg/m2        Objective:   Physical Exam  Vitals reviewed. Constitutional: He appears well-developed and well-nourished.  HENT:  Head: Normocephalic and atraumatic.  Eyes: Conjunctivae normal are normal. Pupils are equal, round, and reactive to light.  Neck: Normal range of motion. Neck supple.  Cardiovascular: Normal rate and regular rhythm.   Murmur heard. Pulmonary/Chest: Effort normal and breath sounds normal.  Abdominal: Soft. Bowel sounds are normal.  Musculoskeletal: He exhibits edema and tenderness.          Assessment & Plan:   Informed consent obtained and the patient's left knee was prepped with betadine. Local anesthesia was obtained with topical spray. Then 40 mg of Depo-Medrol and 1/2 cc of lidocaine was injected into the joint space. The patient tolerated the procedure without complications. Post injection care discussed with patient.  HTN  Stable Chronic  back pain Should pain and bursitis with pain with movement ... ROM limited to 90 degrees   Informed consent obtained and the patient's left  shoulderprepped with betadine. Local anesthesia was obtained with topical spray. Then 40 mg of Depo-Medrol and 1/2 cc of lidocaine was injected into the joint space.  The patient tolerated the procedure without complications. Post injection care discussed with patient.

## 2011-12-24 ENCOUNTER — Telehealth: Payer: Self-pay | Admitting: Internal Medicine

## 2011-12-24 ENCOUNTER — Ambulatory Visit (INDEPENDENT_AMBULATORY_CARE_PROVIDER_SITE_OTHER): Payer: Medicare Other | Admitting: Internal Medicine

## 2011-12-24 DIAGNOSIS — D518 Other vitamin B12 deficiency anemias: Secondary | ICD-10-CM

## 2011-12-24 DIAGNOSIS — D519 Vitamin B12 deficiency anemia, unspecified: Secondary | ICD-10-CM

## 2011-12-24 MED ORDER — CYANOCOBALAMIN 1000 MCG/ML IJ SOLN
1000.0000 ug | INTRAMUSCULAR | Status: AC
  Administered 2011-12-24: 1000 ug via INTRAMUSCULAR

## 2011-12-24 NOTE — Telephone Encounter (Signed)
You  May put him in at lunch today as a double book since it is only a b 12 injection

## 2011-12-24 NOTE — Telephone Encounter (Signed)
Called pt and schd for b-12 inj as noted.

## 2011-12-24 NOTE — Telephone Encounter (Signed)
Pt req to get b-12 inj today. No other slot avail today. Is it ok to sch with another nurse? pls advise.

## 2012-01-06 ENCOUNTER — Other Ambulatory Visit: Payer: Self-pay | Admitting: *Deleted

## 2012-01-06 DIAGNOSIS — M1710 Unilateral primary osteoarthritis, unspecified knee: Secondary | ICD-10-CM

## 2012-01-06 MED ORDER — ETODOLAC 300 MG PO CAPS: 300.0000 mg | ORAL_CAPSULE | Freq: Three times a day (TID) | ORAL | Status: DC

## 2012-01-07 ENCOUNTER — Other Ambulatory Visit: Payer: Self-pay | Admitting: Dermatology

## 2012-01-08 ENCOUNTER — Encounter: Payer: Self-pay | Admitting: Internal Medicine

## 2012-01-08 ENCOUNTER — Ambulatory Visit (INDEPENDENT_AMBULATORY_CARE_PROVIDER_SITE_OTHER): Payer: Medicare Other | Admitting: Internal Medicine

## 2012-01-08 VITALS — BP 150/74 | HR 80 | Temp 98.8°F | Resp 18 | Wt 189.0 lb

## 2012-01-08 DIAGNOSIS — M542 Cervicalgia: Secondary | ICD-10-CM

## 2012-01-08 DIAGNOSIS — I10 Essential (primary) hypertension: Secondary | ICD-10-CM

## 2012-01-08 MED ORDER — METHYLPREDNISOLONE ACETATE 80 MG/ML IJ SUSP
80.0000 mg | Freq: Once | INTRAMUSCULAR | Status: AC
  Administered 2012-01-08: 80 mg via INTRAMUSCULAR

## 2012-01-08 MED ORDER — CYCLOBENZAPRINE HCL 10 MG PO TABS: 10.0000 mg | ORAL_TABLET | Freq: Three times a day (TID) | ORAL | Status: DC | PRN

## 2012-01-08 NOTE — Progress Notes (Signed)
Subjective:    Patient ID: [name], male    DOB: 19-Dec-1940, 71 y.o.   MRN: [id number]  HPI  71 year old patient who has a history of prior cervical radiculopathy. He is status post neck surgery approximately 5 years ago. For the past 2 months she has had increasing neck pain this has intensified over the past 2 days with marked decreased range of motion. No constitutional complaints. He has treated hypertension and coronary artery disease which has been stable.  Past Medical History  Diagnosis Date  . Esophagitis   . Colon polyps   . History of bladder cancer   . Esophagus, Barrett's   . Stenosis of esophagus   . Localized osteoarthrosis, lower leg   . Malaise and fatigue   . Hypothyroidism   . Hypertension   . Anemia   . B12 deficiency anemia   . GERD (gastroesophageal reflux disease)   . Hyperlipidemia   . Depression   . Allergy   . CAD (coronary artery disease)   . COPD (chronic obstructive pulmonary disease)   . Low back pain     History   Social History  . Marital Status: Married    Spouse Name: N/A    Number of Children: N/A  . Years of Education: N/A   Occupational History  . Not on file.   Social History Main Topics  . Smoking status: Former Smoker    Quit date: 03/30/1973  . Smokeless tobacco: Not on file     Comment: quit in 1977  . Alcohol Use: No  . Drug Use: No  . Sexually Active: Yes   Other Topics Concern  . Not on file   Social History Narrative  . No narrative on file    Past Surgical History  Procedure Date  . Coronary artery bypass graft     x4  . Cervical discectomy     ACDF  . Lumbar laminectomy     and fusion  . Bladder cancer   . Popliteal synovial cyst excision   . Knee arthroscopy     Family History  Problem Relation Age of Onset  . Coronary artery disease    . Cancer Mother     melanoma  . Stroke Father   . Hypertension Father     No Known Allergies  Current Outpatient Prescriptions on File Prior to  Visit  Medication Sig Dispense Refill  . aspirin 81 MG tablet Take 81 mg by mouth daily.        [name] esomeprazole (NEXIUM) 40 MG capsule Take 1 capsule (40 mg total) by mouth 2 (two) times daily.  60 capsule  11  . etodolac (LODINE) 300 MG capsule Take 1 capsule (300 mg total) by mouth every 8 (eight) hours.  60 capsule  6  . ezetimibe-simvastatin (VYTORIN) 10-40 MG per tablet Take 1 tablet by mouth at bedtime.        . fexofenadine (ALLEGRA) 180 MG tablet TAKE ONE TABLET BY MOUTH EVERY DAY  30 tablet  6  . folic acid (FOLVITE) 400 MCG tablet Take 400 mcg by mouth daily.        . isosorbide mononitrate (IMDUR) 60 MG 24 hr tablet Take 1 tablet (60 mg total) by mouth daily.  60 tablet  6  . losartan (COZAAR) 50 MG tablet Take 50 mg by mouth daily.        . metoCLOPramide (REGLAN) 10 MG tablet Take 10 mg by mouth 4 (four) times daily.        [name]  nebivolol (BYSTOLIC) 10 MG tablet Take 5 mg by mouth daily.       . nitroGLYCERIN (NITROSTAT) 0.4 MG SL tablet Place 0.4 mg under the tongue every 5 (five) minutes as needed.        . Olopatadine HCl (PATADAY) 0.2 % SOLN Apply 1 drop to eye daily.        [name] oxyCODONE-acetaminophen (PERCOCET) 10-325 MG per tablet Take 1 tablet by mouth 2 (two) times daily as needed.  60 tablet  0  . oxyCODONE-acetaminophen (PERCOCET) 10-325 MG per tablet Take 1 tablet by mouth 2 (two) times daily as needed.  60 tablet  0  . rOPINIRole (REQUIP) 2 MG tablet Take 1 tablet (2 mg total) by mouth at bedtime.  90 tablet  3  . solifenacin (VESICARE) 5 MG tablet Take 2 tablets (10 mg total) by mouth daily.      [name] venlafaxine (EFFEXOR) 75 MG tablet Take 1 tablet (75 mg total) by mouth daily.  90 tablet  3   Current Facility-Administered Medications on File Prior to Visit  Medication Dose Route Frequency Provider Last Rate Last Dose  . cyanocobalamin ((VITAMIN B-12)) injection 1,000 mcg  1,000 mcg Intramuscular Q30 days Stacie Glaze, MD   1,000 mcg at 07/17/11 0959  . methylPREDNISolone  acetate (DEPO-MEDROL) injection 20 mg  20 mg Intra-articular Once Stacie Glaze, MD      . methylPREDNISolone acetate (DEPO-MEDROL) injection 40 mg  40 mg Intra-articular Once Stacie Glaze, MD        BP 150/74  Pulse 80  Temp 98.8 F (37.1 C) (Oral)  Resp 18  Wt 189 lb (85.73 kg)  SpO2 96%       Review of Systems  Constitutional: Negative for fever, chills, appetite change and fatigue.  HENT: Negative for hearing loss, ear pain, congestion, sore throat, trouble swallowing, neck stiffness, dental problem, voice change and tinnitus.   Eyes: Negative for pain, discharge and visual disturbance.  Respiratory: Negative for cough, chest tightness, wheezing and stridor.   Cardiovascular: Negative for chest pain, palpitations and leg swelling.  Gastrointestinal: Negative for nausea, vomiting, abdominal pain, diarrhea, constipation, blood in stool and abdominal distention.  Genitourinary: Negative for urgency, hematuria, flank pain, discharge, difficulty urinating and genital sores.  Musculoskeletal: Negative for myalgias, back pain, joint swelling, arthralgias (Severe neck pain) and gait problem.  Skin: Negative for rash.  Neurological: Negative for dizziness, syncope, speech difficulty, weakness, numbness and headaches.  Hematological: Negative for adenopathy. Does not bruise/bleed easily.  Psychiatric/Behavioral: Negative for behavioral problems and dysphoric mood. The patient is not nervous/anxious.        Objective:   Physical Exam  Constitutional: He appears well-developed and well-nourished. No distress.  Musculoskeletal:       Marked decreased range of motion.  lateral movement of the neck tender to cause posterior neck pain; anterior and posterior range of motion somewhat better but still diminished. No cervical adenopathy          Assessment & Plan:   Neck pain. The patient has analgesic on hand. He will treat her with with a most relaxer warm compresses a soft  cervical collar. He received Depo-Medrol 80. If not improved early next week or he contact the office for referral or possible cervical MRI

## 2012-01-08 NOTE — Patient Instructions (Addendum)
Soft cervical collar as discussed Pain medications and muscle relaxers as prescribed  Call next week if unimproved

## 2012-02-15 ENCOUNTER — Encounter: Payer: Self-pay | Admitting: Internal Medicine

## 2012-02-15 ENCOUNTER — Ambulatory Visit (INDEPENDENT_AMBULATORY_CARE_PROVIDER_SITE_OTHER): Payer: Medicare Other | Admitting: Internal Medicine

## 2012-02-15 ENCOUNTER — Other Ambulatory Visit: Payer: Self-pay | Admitting: *Deleted

## 2012-02-15 VITALS — BP 120/76 | HR 72 | Temp 98.3°F | Resp 16 | Ht 69.0 in | Wt 187.0 lb

## 2012-02-15 DIAGNOSIS — G8929 Other chronic pain: Secondary | ICD-10-CM

## 2012-02-15 DIAGNOSIS — M549 Dorsalgia, unspecified: Secondary | ICD-10-CM

## 2012-02-15 DIAGNOSIS — M1710 Unilateral primary osteoarthritis, unspecified knee: Secondary | ICD-10-CM

## 2012-02-15 DIAGNOSIS — I10 Essential (primary) hypertension: Secondary | ICD-10-CM

## 2012-02-15 DIAGNOSIS — D519 Vitamin B12 deficiency anemia, unspecified: Secondary | ICD-10-CM

## 2012-02-15 DIAGNOSIS — D518 Other vitamin B12 deficiency anemias: Secondary | ICD-10-CM

## 2012-02-15 MED ORDER — METHYLPREDNISOLONE ACETATE 40 MG/ML IJ SUSP: 40.0000 mg | Freq: Once | INTRAMUSCULAR | Status: DC

## 2012-02-15 MED ORDER — OXYCODONE-ACETAMINOPHEN 10-325 MG PO TABS
1.0000 | ORAL_TABLET | Freq: Two times a day (BID) | ORAL | Status: DC | PRN
Start: 2012-02-15 — End: 2012-06-30

## 2012-02-15 MED ORDER — CYANOCOBALAMIN 1000 MCG/ML IJ SOLN
1000.0000 ug | INTRAMUSCULAR | Status: AC
  Administered 2012-02-15: 1000 ug via INTRAMUSCULAR

## 2012-02-15 NOTE — Progress Notes (Signed)
Subjective:    Patient ID: [name], male    DOB: 03-Aug-1940, 72 y.o.   MRN: [id number]  HPI Analysis done the patient had one fall in which she slipped no injury was reported he is orthopedic patient with a history of back pain. Patient also has treated depression a depression screening tool was used to assess stability of his depression treatment.  Refill pain medication per protocol Blood pressure stable on current medications COPD stable   Review of Systems  Constitutional: Positive for fatigue. Negative for fever.  HENT: Negative for hearing loss, congestion, neck pain and postnasal drip.   Eyes: Negative for discharge, redness and visual disturbance.  Respiratory: Negative for cough, shortness of breath and wheezing.   Cardiovascular: Negative for leg swelling.  Gastrointestinal: Negative for abdominal pain, constipation and abdominal distention.  Genitourinary: Negative for urgency and frequency.  Musculoskeletal: Positive for myalgias, back pain, joint swelling and arthralgias.  Skin: Negative for color change and rash.  Neurological: Positive for weakness. Negative for light-headedness.  Hematological: Negative for adenopathy.  Psychiatric/Behavioral: Negative for behavioral problems.   Past Medical History  Diagnosis Date  . Esophagitis   . Colon polyps   . History of bladder cancer   . Esophagus, Barrett's   . Stenosis of esophagus   . Localized osteoarthrosis, lower leg   . Malaise and fatigue   . Hypothyroidism   . Hypertension   . Anemia   . B12 deficiency anemia   . GERD (gastroesophageal reflux disease)   . Hyperlipidemia   . Depression   . Allergy   . CAD (coronary artery disease)   . COPD (chronic obstructive pulmonary disease)   . Low back pain     History   Social History  . Marital Status: Married    Spouse Name: N/A    Number of Children: N/A  . Years of Education: N/A   Occupational History  . Not on file.   Social History  Main Topics  . Smoking status: Former Smoker    Quit date: 03/30/1973  . Smokeless tobacco: Not on file     Comment: quit in 1977  . Alcohol Use: No  . Drug Use: No  . Sexually Active: Yes   Other Topics Concern  . Not on file   Social History Narrative  . No narrative on file    Past Surgical History  Procedure Date  . Coronary artery bypass graft     x4  . Cervical discectomy     ACDF  . Lumbar laminectomy     and fusion  . Bladder cancer   . Popliteal synovial cyst excision   . Knee arthroscopy     Family History  Problem Relation Age of Onset  . Coronary artery disease    . Cancer Mother     melanoma  . Stroke Father   . Hypertension Father     No Known Allergies  Current Outpatient Prescriptions on File Prior to Visit  Medication Sig Dispense Refill  . aspirin 81 MG tablet Take 81 mg by mouth daily.        . cyclobenzaprine (FLEXERIL) 10 MG tablet Take 1 tablet (10 mg total) by mouth 3 (three) times daily as needed for muscle spasms.  30 tablet  0  . esomeprazole (NEXIUM) 40 MG capsule Take 1 capsule (40 mg total) by mouth 2 (two) times daily.  60 capsule  11  . etodolac (LODINE) 300 MG capsule Take 1 capsule (300 mg  total) by mouth every 8 (eight) hours.  60 capsule  6  . ezetimibe-simvastatin (VYTORIN) 10-40 MG per tablet Take 1 tablet by mouth at bedtime.        . fexofenadine (ALLEGRA) 180 MG tablet TAKE ONE TABLET BY MOUTH EVERY DAY  30 tablet  6  . folic acid (FOLVITE) 400 MCG tablet Take 400 mcg by mouth daily.        . isosorbide mononitrate (IMDUR) 60 MG 24 hr tablet Take 1 tablet (60 mg total) by mouth daily.  60 tablet  6  . losartan (COZAAR) 50 MG tablet Take 50 mg by mouth daily.        . metoCLOPramide (REGLAN) 10 MG tablet Take 10 mg by mouth 4 (four) times daily.        . nebivolol (BYSTOLIC) 10 MG tablet Take 5 mg by mouth daily.       . nitroGLYCERIN (NITROSTAT) 0.4 MG SL tablet Place 0.4 mg under the tongue every 5 (five) minutes as needed.         . Olopatadine HCl (PATADAY) 0.2 % SOLN Apply 1 drop to eye daily.        [name] oxyCODONE-acetaminophen (PERCOCET) 10-325 MG per tablet Take 1 tablet by mouth 2 (two) times daily as needed.  60 tablet  0  . oxyCODONE-acetaminophen (PERCOCET) 10-325 MG per tablet Take 1 tablet by mouth 2 (two) times daily as needed.  60 tablet  0  . rOPINIRole (REQUIP) 2 MG tablet Take 1 tablet (2 mg total) by mouth at bedtime.  90 tablet  3  . solifenacin (VESICARE) 5 MG tablet Take 2 tablets (10 mg total) by mouth daily.      [name] venlafaxine (EFFEXOR) 75 MG tablet Take 1 tablet (75 mg total) by mouth daily.  90 tablet  3   Current Facility-Administered Medications on File Prior to Visit  Medication Dose Route Frequency Provider Last Rate Last Dose  . cyanocobalamin ((VITAMIN B-12)) injection 1,000 mcg  1,000 mcg Intramuscular Q30 days Stacie Glaze, MD   1,000 mcg at 07/17/11 0959  . methylPREDNISolone acetate (DEPO-MEDROL) injection 20 mg  20 mg Intra-articular Once Stacie Glaze, MD      . methylPREDNISolone acetate (DEPO-MEDROL) injection 40 mg  40 mg Intra-articular Once Stacie Glaze, MD        BP 120/76  Pulse 72  Temp 98.3 F (36.8 C)  Resp 16  Ht 5\' 9"  (1.753 m)  Wt 187 lb (84.823 kg)  BMI 27.62 kg/m2       Objective:   Physical Exam  Nursing note and vitals reviewed. Constitutional: He appears well-developed and well-nourished.  HENT:  Head: Normocephalic and atraumatic.  Eyes: Conjunctivae normal are normal. Pupils are equal, round, and reactive to light.  Neck: Normal range of motion. Neck supple.  Cardiovascular: Normal rate and regular rhythm.   Pulmonary/Chest: Effort normal and breath sounds normal.  Abdominal: Soft. Bowel sounds are normal.  Musculoskeletal: He exhibits edema and tenderness.       Left knee pain in medial compartment  Neurological: He displays normal reflexes. Coordination abnormal.          Assessment & Plan:  Blood pressure on current medications  we will give samples of his medications.  Samples of Vytorin also be given.  Pain medication refill per protocol. Patient's last knee injection was in June of last year.  He had a shoulder injection in October.    Informed consent obtained and the  patient's left knee was prepped with betadine. Local anesthesia was obtained with topical spray. Then 40 mg of Depo-Medrol and 1/2 cc of lidocaine was injected into the joint space. The patient tolerated the procedure without complications. Post injection care discussed with patient.

## 2012-02-15 NOTE — Patient Instructions (Signed)
The patient is instructed to continue all medications as prescribed. Schedule followup with check out clerk upon leaving the clinic  

## 2012-03-22 ENCOUNTER — Telehealth: Payer: Self-pay | Admitting: Internal Medicine

## 2012-03-22 NOTE — Telephone Encounter (Signed)
Left message on machine None available . Pt instructed to call back if he wanted them sent to his pharmacy

## 2012-03-22 NOTE — Telephone Encounter (Signed)
Patient called stating that he would like some samples of vytorin 10-40 mg 1poqd at bedtime and nexium 40 mg 1po bid. Please assist.

## 2012-03-24 ENCOUNTER — Ambulatory Visit (INDEPENDENT_AMBULATORY_CARE_PROVIDER_SITE_OTHER): Payer: Medicare Other | Admitting: *Deleted

## 2012-03-24 DIAGNOSIS — D519 Vitamin B12 deficiency anemia, unspecified: Secondary | ICD-10-CM

## 2012-03-24 DIAGNOSIS — D518 Other vitamin B12 deficiency anemias: Secondary | ICD-10-CM

## 2012-03-24 MED ORDER — CYANOCOBALAMIN 1000 MCG/ML IJ SOLN
1000.0000 ug | INTRAMUSCULAR | Status: AC
  Administered 2012-03-24: 1000 ug via INTRAMUSCULAR

## 2012-05-11 ENCOUNTER — Telehealth: Payer: Self-pay | Admitting: Internal Medicine

## 2012-05-11 MED ORDER — ESOMEPRAZOLE MAGNESIUM 40 MG PO CPDR: 40.0000 mg | DELAYED_RELEASE_CAPSULE | Freq: Two times a day (BID) | ORAL | Status: DC

## 2012-05-11 NOTE — Telephone Encounter (Signed)
Please let pt know - I must have the name of the pharmacy-I cant just fax to any number--thanks

## 2012-05-11 NOTE — Telephone Encounter (Signed)
Please let pt know script is ready for pick up

## 2012-05-11 NOTE — Telephone Encounter (Signed)
Left message on machine We need to know the name of the pharmacy and jot the number- please call us with pharmacy name

## 2012-05-11 NOTE — Telephone Encounter (Signed)
Called pt to let him know that RX was ready to be picked up and he then requested that we fax it to [phone].

## 2012-05-11 NOTE — Telephone Encounter (Signed)
PT is requesting that we PRINT him a RX for a years supply of esomeprazole (NEXIUM) 40 MG capsule. Please assist.

## 2012-05-16 ENCOUNTER — Ambulatory Visit (INDEPENDENT_AMBULATORY_CARE_PROVIDER_SITE_OTHER): Payer: Medicare Other | Admitting: Internal Medicine

## 2012-05-16 DIAGNOSIS — D518 Other vitamin B12 deficiency anemias: Secondary | ICD-10-CM

## 2012-05-16 DIAGNOSIS — D519 Vitamin B12 deficiency anemia, unspecified: Secondary | ICD-10-CM

## 2012-05-16 MED ORDER — CYANOCOBALAMIN 1000 MCG/ML IJ SOLN
1000.0000 ug | INTRAMUSCULAR | Status: AC
  Administered 2012-05-16: 1000 ug via INTRAMUSCULAR

## 2012-06-15 ENCOUNTER — Ambulatory Visit (INDEPENDENT_AMBULATORY_CARE_PROVIDER_SITE_OTHER): Payer: Medicare Other | Admitting: Internal Medicine

## 2012-06-15 ENCOUNTER — Encounter: Payer: Self-pay | Admitting: Internal Medicine

## 2012-06-15 VITALS — BP 126/72 | HR 72 | Temp 98.6°F | Resp 16 | Ht 69.0 in | Wt 185.0 lb

## 2012-06-15 DIAGNOSIS — I10 Essential (primary) hypertension: Secondary | ICD-10-CM

## 2012-06-15 DIAGNOSIS — D519 Vitamin B12 deficiency anemia, unspecified: Secondary | ICD-10-CM

## 2012-06-15 DIAGNOSIS — D518 Other vitamin B12 deficiency anemias: Secondary | ICD-10-CM

## 2012-06-15 DIAGNOSIS — M1710 Unilateral primary osteoarthritis, unspecified knee: Secondary | ICD-10-CM

## 2012-06-15 DIAGNOSIS — E785 Hyperlipidemia, unspecified: Secondary | ICD-10-CM

## 2012-06-15 MED ORDER — EZETIMIBE-SIMVASTATIN 10-40 MG PO TABS: 1.0000 | ORAL_TABLET | Freq: Every day | ORAL | Status: DC

## 2012-06-15 MED ORDER — METHYLPREDNISOLONE ACETATE 40 MG/ML IJ SUSP: 40.0000 mg | Freq: Once | INTRAMUSCULAR | Status: DC

## 2012-06-15 MED ORDER — CYANOCOBALAMIN 1000 MCG/ML IJ SOLN
1000.0000 ug | INTRAMUSCULAR | Status: DC
  Administered 2012-06-15: 1000 ug via INTRAMUSCULAR

## 2012-06-15 NOTE — Progress Notes (Signed)
  Subjective:    Patient ID: [name], male    DOB: 04-Oct-1940, 73 y.o.   MRN: [id number]  HPI Patient is a 72 year old male who is followed for hypertension hyperlipidemia currently on Vytorin but has been out of medication.  Blood pressure is stable actually improved since he's lost weight and been exercising on a regular basis he has 2 primary complaint today one is a growth beneath his left eye and the other is arthritic pain in his left knee.  Patient's had injection of the knee in the past he does have severe osteoarthritis and loss of cartilage in the left knee.     Review of Systems  Constitutional: Negative for fever and fatigue.  HENT: Negative for hearing loss, congestion, neck pain and postnasal drip.   Eyes: Negative for discharge, redness and visual disturbance.  Respiratory: Negative for cough, shortness of breath and wheezing.   Cardiovascular: Negative for leg swelling.  Gastrointestinal: Negative for abdominal pain, constipation and abdominal distention.  Genitourinary: Negative for urgency and frequency.  Musculoskeletal: Positive for joint swelling and gait problem. Negative for arthralgias.  Skin: Negative for color change and rash.       Growth under left eye  Neurological: Negative for weakness and light-headedness.  Hematological: Negative for adenopathy.  Psychiatric/Behavioral: Negative for behavioral problems.       Objective:   Physical Exam  Nursing note and vitals reviewed. Constitutional: He appears well-developed and well-nourished.  HENT:  Head: Normocephalic and atraumatic.  Eyes: Conjunctivae are normal. Pupils are equal, round, and reactive to light.  Neck: Normal range of motion. Neck supple.  Cardiovascular: Normal rate and regular rhythm.   Pulmonary/Chest: Effort normal and breath sounds normal.  Abdominal: Soft. Bowel sounds are normal.  Skin:  Small wart growth under left          Assessment & Plan:  patienhas a small  wart underneath the left thigh this was removed with surgical scissors.  Patient has severe osteoarthritis of the left knee  Informed consent obtained and the patient's knee was prepped with betadine. Local anesthesia was obtained with topical spray. Then 40 mg of Depo-Medrol and 1/2 cc of lidocaine was injected into the joint space. The patient tolerated the procedure without complications. Post injection care discussed with patient.  We discussed the probability that he will require knee replacementt  Patient's blood pressure stayed Hyperlipidemia resume Vytorin

## 2012-06-17 ENCOUNTER — Other Ambulatory Visit: Payer: Self-pay | Admitting: Internal Medicine

## 2012-06-17 ENCOUNTER — Ambulatory Visit: Payer: Medicare Other | Admitting: Internal Medicine

## 2012-06-21 ENCOUNTER — Ambulatory Visit (INDEPENDENT_AMBULATORY_CARE_PROVIDER_SITE_OTHER): Payer: Medicare Other | Admitting: Cardiovascular Disease

## 2012-06-21 ENCOUNTER — Encounter: Payer: Self-pay | Admitting: Cardiovascular Disease

## 2012-06-21 VITALS — BP 126/70 | HR 75 | Ht 70.0 in | Wt 182.0 lb

## 2012-06-21 DIAGNOSIS — R0609 Other forms of dyspnea: Secondary | ICD-10-CM

## 2012-06-21 DIAGNOSIS — E8881 Metabolic syndrome: Secondary | ICD-10-CM

## 2012-06-21 DIAGNOSIS — R0989 Other specified symptoms and signs involving the circulatory and respiratory systems: Secondary | ICD-10-CM

## 2012-06-21 DIAGNOSIS — I2510 Atherosclerotic heart disease of native coronary artery without angina pectoris: Secondary | ICD-10-CM

## 2012-06-21 DIAGNOSIS — I255 Ischemic cardiomyopathy: Secondary | ICD-10-CM

## 2012-06-21 DIAGNOSIS — G2581 Restless legs syndrome: Secondary | ICD-10-CM

## 2012-06-21 DIAGNOSIS — I119 Hypertensive heart disease without heart failure: Secondary | ICD-10-CM

## 2012-06-21 DIAGNOSIS — E785 Hyperlipidemia, unspecified: Secondary | ICD-10-CM

## 2012-06-21 DIAGNOSIS — I2589 Other forms of chronic ischemic heart disease: Secondary | ICD-10-CM

## 2012-06-21 DIAGNOSIS — J449 Chronic obstructive pulmonary disease, unspecified: Secondary | ICD-10-CM

## 2012-06-21 NOTE — Progress Notes (Signed)
Patient ID: [name], male   DOB: 06-Aug-1940, 72 y.o.   MRN: [id number]   HPI: [name], is a 72 y.o. male who presents to the office today for cardiology followup evaluation. [name] is now 72 years old. In 2002 he underwent CABG revascularization surgery LIMA to his LAD, vein to the marginal, vein to the RCA. Her catheterization in 2004 showed patent grafts appear echo Doppler study in August 2012 ejection fraction of 40-45% with moderate septal hypokinesis with grade 1 diastolic dysfunction as well as aortic valve sclerosis. Additional problems include GERD, mild emphysema, as well as obstructive sleep apnea. He no longer utilizes CPAP the since his wife died in February 10, 2013following a motor vehicle accident, he has lost approximately 25 pounds. He remains active. Still notes mild shortness of breath with activity he does have issues with cramps in his legs typically at night. He also is a history of restless leg syndrome and has benefited with requip. His last blood work was done  in December 2013. At that time, LDL was 57 with LDL particle #1035, small LDL particle number was 611, slightly increased, HDLC was 35 triglycerides 93 total cholesterol 111 initial resistance course slightly elevated at 53. Over the past 6 months [name] has felt well. Has recurrent anginal symptoms. Is less marked on a perfusion study was August 2012.  Past Medical History  Diagnosis Date  . Esophagitis   . Colon polyps   . History of bladder cancer   . Esophagus, Barrett's   . Stenosis of esophagus   . Localized osteoarthrosis, lower leg   . Malaise and fatigue   . Hypothyroidism   . Hypertension   . Anemia   . B12 deficiency anemia   . GERD (gastroesophageal reflux disease)   . Hyperlipidemia   . Depression   . Allergy   . CAD (coronary artery disease)   . COPD (chronic obstructive pulmonary disease)   . Low back pain     Past Surgical History  Procedure Laterality Date  .  Coronary artery bypass graft      x4  . Cervical discectomy      ACDF  . Lumbar laminectomy      and fusion  . Bladder cancer    . Popliteal synovial cyst excision    . Knee arthroscopy      No Known Allergies  Current Outpatient Prescriptions  Medication Sig Dispense Refill  . lisinopril (PRINIVIL,ZESTRIL) 20 MG tablet Take 20 mg by mouth daily.      [name] aspirin 81 MG tablet Take 81 mg by mouth daily.        [name] esomeprazole (NEXIUM) 40 MG capsule Take 1 capsule (40 mg total) by mouth 2 (two) times daily.  180 capsule  3  . etodolac (LODINE) 300 MG capsule TAKE ONE CAPSULE BY MOUTH EVERY 8 HOURS  60 capsule  6  . ezetimibe-simvastatin (VYTORIN) 10-40 MG per tablet Take 1 tablet by mouth at bedtime.  30 tablet  11  . folic acid (FOLVITE) 400 MCG tablet Take 400 mcg by mouth daily.        . isosorbide mononitrate (IMDUR) 60 MG 24 hr tablet Take 1 tablet (60 mg total) by mouth daily.  60 tablet  6  . nebivolol (BYSTOLIC) 10 MG tablet Take 5 mg by mouth daily.       . nitroGLYCERIN (NITROSTAT) 0.4 MG SL tablet Place 0.4 mg under the tongue every 5 (five) minutes as  needed.        [name] oxyCODONE-acetaminophen (PERCOCET) 10-325 MG per tablet Take 1 tablet by mouth 2 (two) times daily as needed.  180 tablet  0  . rOPINIRole (REQUIP) 2 MG tablet TAKE ONE TABLET BY MOUTH AT BEDTIME  90 tablet  0  . venlafaxine (EFFEXOR) 75 MG tablet Take 1 tablet (75 mg total) by mouth daily.  90 tablet  3   Current Facility-Administered Medications  Medication Dose Route Frequency Provider Last Rate Last Dose  . cyanocobalamin ((VITAMIN B-12)) injection 1,000 mcg  1,000 mcg Intramuscular Q30 days Stacie Glaze, MD   1,000 mcg at 07/17/11 1610   Socially he is widowed in January 2013. He has 3 children 6 grandchildren. 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 There is no recent tobacco use. Is no alcohol  ROS is negative for fever  chills night sweats. Denies tachycardia palpitations. There some mild shortness of breath with activity. She level is improved. He is unaware of any snoring. He denies residual daytime sleepiness. He denies palpitations. He still has issues with restless legs. He still notes nocturnal leg cramps. Other system review is negative  PE BP 126/70  Pulse 75  Ht 5\' 10"  (1.778 m)  Wt 182 lb (82.555 kg)  BMI 26.11 kg/m2  General: Alert, oriented, no distress.  HEENT: Normocephalic, atraumatic. Pupils round and reactive; sclera anicteric; Nose without nasal septal hypertrophy Mouth/Parynx benign; Mallinpatti scale 3 Neck: No JVD, no carotid briuts Lungs: clear to ausculatation and percussion; no wheezing or rales Heart: RRR, s1 s2 normal 1/6 systolic murmur. Abdomen: soft, nontender; no hepatosplenomehaly, BS+; abdominal aorta nontender and not dilated by palpation. Pulses 2+ Extremities: no clubbinbg cyanosis or edema, Homan's sign negative  Neurologic: grossly nonfocal  ECG: Sinus rhythm at 75 beats per minute. There is mild RV conduction delay. Intervals were normal.  LABS:  BMET    Component Value Date/Time   NA 142 10/01/2010 1113   K 4.5 10/01/2010 1113   CL 106 10/01/2010 1113   CO2 29 10/01/2010 1113   GLUCOSE 106* 10/01/2010 1113   BUN 26* 10/01/2010 1113   CREATININE 1.1 10/01/2010 1113   CALCIUM 8.8 10/01/2010 1113   GFRNONAA 67.29 12/27/2009 0909   GFRAA 86 07/07/2007 1044     Hepatic Function Panel     Component Value Date/Time   PROT 7.3 04/25/2007 1127   ALBUMIN 4.2 04/25/2007 1127   AST 14 04/25/2007 1127   ALT 13 04/25/2007 1127   ALKPHOS 66 04/25/2007 1127   BILITOT 0.4 04/25/2007 1127   BILIDIR 0.2 04/08/2007 1000     CBC    Component Value Date/Time   WBC 5.1 12/27/2009 0909   WBC 6.1 04/25/2007 1127   RBC 4.00* 12/27/2009 0909   RBC 4.16* 04/25/2007 1127   HGB 12.8* 12/27/2009 0909   HGB 13.0 04/25/2007 1127   HCT 37.6* 12/27/2009 0909   HCT 37.0* 04/25/2007 1127   PLT 140.0*  12/27/2009 0909   PLT 227 04/25/2007 1127   MCV 94.0 12/27/2009 0909   MCV 89.0 04/25/2007 1127   MCH 31.3 04/25/2007 1127   MCHC 34.1 12/27/2009 0909   MCHC 35.2 04/25/2007 1127   RDW 13.7 12/27/2009 0909   RDW 13.8 04/25/2007 1127   LYMPHSABS 1.9 12/27/2009 0909   LYMPHSABS 2.5 04/25/2007 1127   MONOABS 0.4 12/27/2009 0909   MONOABS 0.6 04/25/2007 1127  EOSABS 0.2 12/27/2009 0909   EOSABS 0.0 04/25/2007 1127   BASOSABS 0.0 12/27/2009 0909   BASOSABS 0.0 04/25/2007 1127     BNP No results found for this basename: probnp    Lipid Panel     Component Value Date/Time   CHOL 165 04/17/2008 0911   TRIG 245* 04/08/2007 1000   HDL 32.20* 04/17/2008 0911   CHOLHDL 5.3 CALC 04/08/2007 1000   VLDL 49* 04/08/2007 1000     RADIOLOGY: No results found.    ASSESSMENT AND PLAN: The cardiac standpoint, Mr. Heinle continues to do very well. He is now 12 years status post his CABG revascularization surgery. His last catheterization was 10 years ago for recurrent chest pain which continued to show patent grafts. His ejection fraction is 40-45%. Presently he is doing well. He has more energy with his 25 pound weight loss. He did discuss his nocturnal cramps. His blood pressure is now well controlled. His shortness of breath has improved. Will continue him on his current medical regimen. I will repeat lab work in December and see him back in the office for followup evaluation.     Lennette Bihari, MD, [facility]  06/21/2012 9:09 AM

## 2012-06-21 NOTE — Patient Instructions (Signed)
Your physician recommends that you schedule a follow-up appointment in: 6 MONTHS.  Your physician recommends that you return for lab work in: 6 MONTHS.

## 2012-06-30 ENCOUNTER — Telehealth: Payer: Self-pay | Admitting: Internal Medicine

## 2012-06-30 MED ORDER — OXYCODONE-ACETAMINOPHEN 10-325 MG PO TABS: 1.0000 | ORAL_TABLET | Freq: Two times a day (BID) | ORAL | Status: DC | PRN

## 2012-06-30 NOTE — Telephone Encounter (Signed)
Pt needs 90 day refill of oxyCODONE-acetaminophen (PERCOCET) 10-325 MG per tablet .

## 2012-06-30 NOTE — Telephone Encounter (Signed)
Printed and will call pt to pikc up after dr Lovell Sheehan signs tomorrow,friday

## 2012-07-29 ENCOUNTER — Other Ambulatory Visit: Payer: Self-pay | Admitting: Cardiovascular Disease

## 2012-07-29 NOTE — Telephone Encounter (Signed)
Rx was sent to pharmacy electronically. 

## 2012-08-15 ENCOUNTER — Other Ambulatory Visit: Payer: Self-pay | Admitting: Internal Medicine

## 2012-08-26 ENCOUNTER — Ambulatory Visit (INDEPENDENT_AMBULATORY_CARE_PROVIDER_SITE_OTHER): Payer: Medicare Other | Admitting: Internal Medicine

## 2012-08-26 DIAGNOSIS — E538 Deficiency of other specified B group vitamins: Secondary | ICD-10-CM

## 2012-08-26 MED ORDER — CYANOCOBALAMIN 1000 MCG/ML IJ SOLN
1000.0000 ug | Freq: Once | INTRAMUSCULAR | Status: AC
  Administered 2012-08-26: 1000 ug via INTRAMUSCULAR

## 2012-08-29 ENCOUNTER — Encounter: Payer: Self-pay | Admitting: Gastroenterology

## 2012-09-01 ENCOUNTER — Encounter: Payer: Self-pay | Admitting: Family Medicine

## 2012-09-01 ENCOUNTER — Ambulatory Visit (INDEPENDENT_AMBULATORY_CARE_PROVIDER_SITE_OTHER): Payer: Medicare Other | Admitting: Family Medicine

## 2012-09-01 VITALS — BP 128/72 | HR 73 | Temp 97.7°F | Wt 187.0 lb

## 2012-09-01 DIAGNOSIS — IMO0002 Reserved for concepts with insufficient information to code with codable children: Secondary | ICD-10-CM

## 2012-09-01 DIAGNOSIS — M1710 Unilateral primary osteoarthritis, unspecified knee: Secondary | ICD-10-CM

## 2012-09-01 DIAGNOSIS — M1712 Unilateral primary osteoarthritis, left knee: Secondary | ICD-10-CM

## 2012-09-01 NOTE — Progress Notes (Signed)
  Subjective:    Patient ID: [name], male    DOB: 06-04-1940, 72 y.o.   MRN: [id number]  HPI Osteoarthritis left knee. Patient's had previous steroid injections and requesting the same today. He stays quite active. Frequently he does a lot of crawling and climbing. He has known osteoarthritis. Did get some improvement from the steroid injection several months ago but worsening of pain over the past couple weeks. No recent acute injury. Pain is mostly medial aspect left knee. He is aware that he will likely need total knee replacement soon.  Past Medical History  Diagnosis Date  . Esophagitis   . Colon polyps   . History of bladder cancer   . Esophagus, Barrett's   . Stenosis of esophagus   . Localized osteoarthrosis, lower leg   . Malaise and fatigue   . Hypothyroidism   . Hypertension   . Anemia   . B12 deficiency anemia   . GERD (gastroesophageal reflux disease)   . Hyperlipidemia   . Depression   . Allergy   . CAD (coronary artery disease)   . COPD (chronic obstructive pulmonary disease)   . Low back pain    Past Surgical History  Procedure Laterality Date  . Coronary artery bypass graft      x4  . Cervical discectomy      ACDF  . Lumbar laminectomy      and fusion  . Bladder cancer    . Popliteal synovial cyst excision    . Knee arthroscopy      reports that he quit smoking about 39 years ago. He does not have any smokeless tobacco history on file. He reports that he does not drink alcohol or use illicit drugs. family history includes Cancer in his mother; Coronary artery disease in an other family member; Hypertension in his father; Stroke in his father. No Known Allergies    Review of Systems  Constitutional: Negative for fever and chills.  Respiratory: Negative for stridor.   Musculoskeletal: Positive for arthralgias.       Objective:   Physical Exam  Constitutional: He appears well-developed and well-nourished.  Cardiovascular: Normal rate  and regular rhythm.   Pulmonary/Chest: Effort normal and breath sounds normal. No respiratory distress. He has no wheezes. He has no rales.  Musculoskeletal:  Left knee reveals no erythema. Fairly good range of motion with some crepitus. Mild medial compartment tenderness. No instability. No erythema. No ecchymosis No effusion.          Assessment & Plan:  Advanced osteoarthritis left knee. Patient requesting corticosteroid injection. Discussed risk and benefits and patient consented.  Knee prepped with Betadine. Using anterior medial approach injected 40 mg Depo-Medrol 2 cc of plain Xylocaine using 25-gauge one and 1/2 inch needle. Patient tolerated well. We discussed that there are limits on frequency of corticosteroid injection. If this is not helping for several months he is aware he needs to see orthopedist for further evaluation

## 2012-09-01 NOTE — Patient Instructions (Addendum)
Follow up promptly for any redness or increased swelling.

## 2012-09-23 ENCOUNTER — Ambulatory Visit (INDEPENDENT_AMBULATORY_CARE_PROVIDER_SITE_OTHER): Payer: Medicare Other | Admitting: *Deleted

## 2012-09-23 DIAGNOSIS — D518 Other vitamin B12 deficiency anemias: Secondary | ICD-10-CM

## 2012-09-23 MED ORDER — CYANOCOBALAMIN 1000 MCG/ML IJ SOLN
1000.0000 ug | Freq: Once | INTRAMUSCULAR | Status: AC
  Administered 2012-09-23: 1000 ug via INTRAMUSCULAR

## 2012-10-17 ENCOUNTER — Ambulatory Visit: Payer: Medicare Other | Admitting: Internal Medicine

## 2012-10-18 ENCOUNTER — Ambulatory Visit (INDEPENDENT_AMBULATORY_CARE_PROVIDER_SITE_OTHER): Payer: Medicare Other | Admitting: Internal Medicine

## 2012-10-18 ENCOUNTER — Encounter: Payer: Self-pay | Admitting: Internal Medicine

## 2012-10-18 VITALS — BP 122/62 | HR 68 | Temp 98.0°F | Ht 70.0 in | Wt 188.0 lb

## 2012-10-18 DIAGNOSIS — R918 Other nonspecific abnormal finding of lung field: Secondary | ICD-10-CM

## 2012-10-18 DIAGNOSIS — D518 Other vitamin B12 deficiency anemias: Secondary | ICD-10-CM

## 2012-10-18 DIAGNOSIS — M545 Low back pain, unspecified: Secondary | ICD-10-CM

## 2012-10-18 DIAGNOSIS — Z23 Encounter for immunization: Secondary | ICD-10-CM

## 2012-10-18 DIAGNOSIS — E785 Hyperlipidemia, unspecified: Secondary | ICD-10-CM

## 2012-10-18 DIAGNOSIS — L570 Actinic keratosis: Secondary | ICD-10-CM

## 2012-10-18 DIAGNOSIS — M79604 Pain in right leg: Secondary | ICD-10-CM

## 2012-10-18 DIAGNOSIS — D519 Vitamin B12 deficiency anemia, unspecified: Secondary | ICD-10-CM

## 2012-10-18 DIAGNOSIS — I10 Essential (primary) hypertension: Secondary | ICD-10-CM

## 2012-10-18 DIAGNOSIS — M79609 Pain in unspecified limb: Secondary | ICD-10-CM

## 2012-10-18 MED ORDER — CYANOCOBALAMIN 1000 MCG/ML IJ SOLN
1000.0000 ug | INTRAMUSCULAR | Status: AC
  Administered 2012-10-18: 1000 ug via INTRAMUSCULAR

## 2012-10-18 MED ORDER — ISOSORBIDE MONONITRATE ER 60 MG PO TB24: ORAL_TABLET | ORAL | Status: DC

## 2012-10-18 MED ORDER — OXYCODONE-ACETAMINOPHEN 10-325 MG PO TABS: 1.0000 | ORAL_TABLET | Freq: Two times a day (BID) | ORAL | Status: DC | PRN

## 2012-10-18 MED ORDER — GABAPENTIN 100 MG PO CAPS: ORAL_CAPSULE | ORAL | Status: DC

## 2012-10-18 MED ORDER — FLUTICASONE PROPIONATE 50 MCG/ACT NA SUSP: 2.0000 | Freq: Every day | NASAL | Status: DC

## 2012-10-18 MED ORDER — ESOMEPRAZOLE MAGNESIUM 40 MG PO CPDR: 40.0000 mg | DELAYED_RELEASE_CAPSULE | Freq: Two times a day (BID) | ORAL | Status: DC

## 2012-10-18 NOTE — Progress Notes (Signed)
Subjective:    Patient ID: [name], male    DOB: 10-Jul-1940, 72 y.o.   MRN: [id number]  HPI  72 year old white male with chronic lower remedy pain, hypertension and coronary artery disease for followup. Patient reports he has had chronic leg pains for years. It has been worsening over last several months. He reports history of 2 lumbar surgeries in the past. His leg pains are worse with prolonged sitting. Patient also has a tough time when he first gets up in the morning. He takes approximately 2 hours for him to get "moving". He complains of morning joint pains and chronic achiness in both legs. Symptoms not localized to proximal or distal muscles.  He reports chronic numbness in the distal part of his foot bilaterally. This is presumed secondary to lumbar spinal stenosis.  He currently takes Percocet 10/325 mg one half tablet twice daily. He denies any adverse effects. He denies any constipation.  He also has history of lung nodules and thinks he is due for surveillance.  He also complains of 2 scaly areas in right arm. He has history of actinic keratosis.  Review of Systems Patient up-to-date with B12 injections, negative for bowel or bladder incontinence, mild lower extremity weakness Past Medical History  Diagnosis Date  . Esophagitis   . Colon polyps   . History of bladder cancer   . Esophagus, Barrett's   . Stenosis of esophagus   . Localized osteoarthrosis, lower leg   . Malaise and fatigue   . Hypothyroidism   . Hypertension   . Anemia   . B12 deficiency anemia   . GERD (gastroesophageal reflux disease)   . Hyperlipidemia   . Depression   . Allergy   . CAD (coronary artery disease)   . COPD (chronic obstructive pulmonary disease)   . Low back pain     History   Social History  . Marital Status: Married    Spouse Name: N/A    Number of Children: N/A  . Years of Education: N/A   Occupational History  . Not on file.   Social History Main Topics  .  Smoking status: Former Smoker    Quit date: 03/30/1973  . Smokeless tobacco: Not on file     Comment: quit in 1977  . Alcohol Use: No  . Drug Use: No  . Sexual Activity: Yes   Other Topics Concern  . Not on file   Social History Narrative  . No narrative on file    Past Surgical History  Procedure Laterality Date  . Coronary artery bypass graft      x4  . Cervical discectomy      ACDF  . Lumbar laminectomy      and fusion  . Bladder cancer    . Popliteal synovial cyst excision    . Knee arthroscopy      Family History  Problem Relation Age of Onset  . Coronary artery disease    . Cancer Mother     melanoma  . Stroke Father   . Hypertension Father     No Known Allergies  Current Outpatient Prescriptions on File Prior to Visit  Medication Sig Dispense Refill  . aspirin 81 MG tablet Take 81 mg by mouth daily.        [name] etodolac (LODINE) 300 MG capsule TAKE ONE CAPSULE BY MOUTH EVERY 8 HOURS  60 capsule  6  . ezetimibe-simvastatin (VYTORIN) 10-40 MG per tablet Take 1 tablet by mouth at bedtime.  30 tablet  11  . folic acid (FOLVITE) 400 MCG tablet Take 400 mcg by mouth daily.        [name] losartan (COZAAR) 50 MG tablet TAKE ONE TABLET BY MOUTH DAILY.  30 tablet  6  . nebivolol (BYSTOLIC) 10 MG tablet Take 5 mg by mouth daily.       . nitroGLYCERIN (NITROSTAT) 0.4 MG SL tablet Place 0.4 mg under the tongue every 5 (five) minutes as needed.        [name] rOPINIRole (REQUIP) 2 MG tablet TAKE ONE TABLET BY MOUTH AT BEDTIME  90 tablet  0  . venlafaxine (EFFEXOR) 75 MG tablet TAKE ONE TABLET BY MOUTH DAILY.  90 tablet  3   No current facility-administered medications on file prior to visit.    BP 122/62  Pulse 68  Temp(Src) 98 F (36.7 C) (Oral)  Ht 5\' 10"  (1.778 m)  Wt 188 lb (85.276 kg)  BMI 26.98 kg/m2       Objective:   Physical Exam  Constitutional: He is oriented to person, place, and time. He appears well-developed and well-nourished.  HENT:  Head:  Normocephalic and atraumatic.  Right Ear: External ear normal.  Left Ear: External ear normal.  Mouth/Throat: Oropharynx is clear and moist.  Eyes: EOM are normal. Pupils are equal, round, and reactive to light.  Neck: Neck supple.  No carotid bruit  Cardiovascular: Normal rate, regular rhythm and normal heart sounds.   No murmur heard. Pulmonary/Chest: Effort normal and breath sounds normal. He has no wheezes.  Abdominal: Soft. Bowel sounds are normal. There is no tenderness.  Musculoskeletal: He exhibits no edema.  Discomfort with lumbar flexion, extension and side bending.  Patient able to squat but has mild weakness. Patient able to toe and heel walk without difficulty.  Neurological: He is alert and oriented to person, place, and time. He has normal reflexes. He displays normal reflexes. No cranial nerve deficit.  Decreased sensation to temperature and vibration in feet bilaterally  Skin: Skin is warm and dry.  2 cm scaly areas x 2 on right forearm.  Psychiatric: He has a normal mood and affect. His behavior is normal.          Assessment & Plan:

## 2012-10-18 NOTE — Assessment & Plan Note (Signed)
72 year old white male with chronic bilateral leg pains. I suspect his symptoms are multifactorial. His discomfort mainly stems from lumbar stenosis. He may have component of osteoarthritis in his large joints of lower extremity. I suggest we monitor his electrolytes and kidney function while he takes NSAIDs. Continue same dose of Percocet for now. Pain contract signed.  Trial of gabapentin. Slowly titrate over next several weeks. Try to reduce Percocet dose if possible.  Check CPK and sedimentation rate. I doubt his symptoms secondary to myalgia myositis from statin use.

## 2012-10-18 NOTE — Assessment & Plan Note (Addendum)
Refer to Dr. Delton Coombes for follow up.  His last CT scan was in 03/2010  -  1. Small clusters of micronodules in the upper lobes seen on the  most recent chest CT have resolved consistent with resolved infectious or inflammatory process.  2. Tiny right middle and right upper lobe nodules are unchanged back to 2007 consistent with benignity. No follow-up imaging is recommended.

## 2012-10-18 NOTE — Assessment & Plan Note (Signed)
Patient has two area on right forearm.  Treated with liquid nitrogen.  He tolerated well.  Aftercare discussed.  Reassess in 2 months.

## 2012-10-18 NOTE — Patient Instructions (Addendum)
Use over the counter hydrocortisone cream twice daily as directed (face) Use over the counter fexofenadine 180 mg once daily Please sign on to MyChart to view your test results.

## 2012-11-23 ENCOUNTER — Institutional Professional Consult (permissible substitution): Payer: Medicare Other | Admitting: Emergency Medicine

## 2012-11-24 ENCOUNTER — Ambulatory Visit (INDEPENDENT_AMBULATORY_CARE_PROVIDER_SITE_OTHER): Payer: Medicare Other | Admitting: Internal Medicine

## 2012-11-24 DIAGNOSIS — D518 Other vitamin B12 deficiency anemias: Secondary | ICD-10-CM

## 2012-11-24 MED ORDER — CYANOCOBALAMIN 1000 MCG/ML IJ SOLN
1000.0000 ug | Freq: Once | INTRAMUSCULAR | Status: AC
  Administered 2012-11-24: 1000 ug via INTRAMUSCULAR

## 2012-11-29 ENCOUNTER — Other Ambulatory Visit: Payer: Self-pay | Admitting: Internal Medicine

## 2012-12-13 ENCOUNTER — Encounter: Payer: Self-pay | Admitting: Cardiovascular Disease

## 2012-12-13 ENCOUNTER — Ambulatory Visit (INDEPENDENT_AMBULATORY_CARE_PROVIDER_SITE_OTHER): Payer: Medicare Other | Admitting: Cardiovascular Disease

## 2012-12-13 VITALS — BP 144/80 | HR 68 | Ht 69.0 in | Wt 185.6 lb

## 2012-12-13 DIAGNOSIS — E782 Mixed hyperlipidemia: Secondary | ICD-10-CM

## 2012-12-13 DIAGNOSIS — R5381 Other malaise: Secondary | ICD-10-CM

## 2012-12-13 DIAGNOSIS — J449 Chronic obstructive pulmonary disease, unspecified: Secondary | ICD-10-CM

## 2012-12-13 DIAGNOSIS — I10 Essential (primary) hypertension: Secondary | ICD-10-CM

## 2012-12-13 DIAGNOSIS — G2581 Restless legs syndrome: Secondary | ICD-10-CM

## 2012-12-13 DIAGNOSIS — I2510 Atherosclerotic heart disease of native coronary artery without angina pectoris: Secondary | ICD-10-CM

## 2012-12-13 DIAGNOSIS — K219 Gastro-esophageal reflux disease without esophagitis: Secondary | ICD-10-CM

## 2012-12-13 NOTE — Patient Instructions (Addendum)
Your physician recommends that you return for lab work fasting.  Your physician recommends that you schedule a follow-up appointment in:6 MONTHS. 

## 2012-12-13 NOTE — Progress Notes (Signed)
Patient ID: [name], male   DOB: 1940/05/27, 72 y.o.   MRN: [id number]     HPI: [name], is a 72 y.o. male who presents to the office today for a 6 month cardiology followup evaluation.  [name] is now 72 years old. In 2002 he underwent CABG revascularization surgery LIMA to his LAD, vein to the marginal, vein to the RCA. Her catheterization in 2004 showed patent grafts appear echo Doppler study in August 2012 ejection fraction of 40-45% with moderate septal hypokinesis with grade 1 diastolic dysfunction as well as aortic valve sclerosis. Additional problems include GERD, mild emphysema, as well as obstructive sleep apnea. He no longer utilizes CPAP. He has lost approximately 25 pounds since his wife's that the motor vehicle accident in January 2013 to He remains active. Still notes mild shortness of breath with activity he does have issues with cramps in his legs typically at night. He also is a history of restless leg syndrome and has benefited with requip. His last blood work was done  in December 2013. At that time, LDL was 57 with LDL particle #1035, small LDL particle number was 611, slightly increased, HDLC was 35 triglycerides 93 total cholesterol 111 initial resistance course slightly elevated at 53. Over the past 6 months [name] has felt well. He denies recurrent anginal symptoms. His last myocardial perfusion study was done in August 2012. 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  Past Medical History  Diagnosis Date  . Esophagitis   . Colon polyps   . History of bladder cancer   . Esophagus, Barrett's   . Stenosis of esophagus   . Localized osteoarthrosis, lower leg   . Malaise and fatigue   . Hypothyroidism   . Hypertension   . Anemia   . B12 deficiency anemia   . GERD (gastroesophageal reflux disease)   . Hyperlipidemia   . Depression   . Allergy   . CAD (coronary artery disease)     . COPD (chronic obstructive pulmonary disease)   . Low back pain     Past Surgical History  Procedure Laterality Date  . Coronary artery bypass graft      x4  . Cervical discectomy      ACDF  . Lumbar laminectomy      and fusion  . Bladder cancer    . Popliteal synovial cyst excision    . Knee arthroscopy      No Known Allergies  Current Outpatient Prescriptions  Medication Sig Dispense Refill  . aspirin 81 MG tablet Take 81 mg by mouth daily.        [name] esomeprazole (NEXIUM) 40 MG capsule Take 1 capsule (40 mg total) by mouth 2 (two) times daily.  30 capsule  11  . etodolac (LODINE) 300 MG capsule take 1 tablet twice daily.      [name] ezetimibe-simvastatin (VYTORIN) 10-40 MG per tablet Take 1 tablet by mouth at bedtime.  30 tablet  11  . folic acid (FOLVITE) 400 MCG tablet Take 400 mcg by mouth daily.        . isosorbide mononitrate (IMDUR) 60 MG 24 hr tablet TAKE ONE TABLET BY MOUTH DAILY.  180 tablet  3  . losartan (COZAAR) 50 MG tablet TAKE ONE TABLET BY MOUTH DAILY.  30 tablet  6  . nebivolol (BYSTOLIC) 10 MG tablet Take 5 mg by mouth daily.       [name]  nitroGLYCERIN (NITROSTAT) 0.4 MG SL tablet Place 0.4 mg under the tongue every 5 (five) minutes as needed.        [name] oxyCODONE-acetaminophen (PERCOCET) 10-325 MG per tablet Take 1 tablet by mouth 2 (two) times daily as needed.  60 tablet  0  . rOPINIRole (REQUIP) 2 MG tablet TAKE ONE TABLET BY MOUTH AT BEDTIME  90 tablet  1  . venlafaxine (EFFEXOR) 75 MG tablet TAKE ONE TABLET BY MOUTH DAILY.  90 tablet  3   No current facility-administered medications for this visit.   Socially he is widowed in January 2013. He has 3 children 6 grandchildren. 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 There is no recent tobacco use.  no alcohol  ROS is negative for fever chills night sweats. He denies rashes. There is no change in vision or hearing. He denies increasing  cough or excess sputum production. There is no wheezing. He does have mild COPD. Denies tachycardia palpitations. There some mild shortness of breath with activity. There is no angina. He denies nausea vomiting or diarrhea. He does have history of GERD. He denies blood in stool or urine. He is unaware of any snoring. He denies residual daytime sleepiness. He denies palpitations. He restless legs symptoms have improved".. He still notes nocturnal leg cramps. Other contents of 12 point system review is negative  PE BP 144/80  Pulse 68  Ht 5\' 9"  (1.753 m)  Wt 185 lb 9.6 oz (84.188 kg)  BMI 27.40 kg/m2  General: Alert, oriented, no distress.  HEENT: Normocephalic, atraumatic. Pupils round and reactive; sclera anicteric; Nose without nasal septal hypertrophy Mouth/Parynx benign; Mallinpatti scale 3 Neck: No JVD, no carotid briuts Lungs: clear to ausculatation and percussion; no wheezing or rales Heart: RRR, s1 s2 normal 1/6 systolic murmur. Abdomen: soft, nontender; no hepatosplenomehaly, BS+; abdominal aorta nontender and not dilated by palpation. Pulses 2+ Extremities: no clubbinbg cyanosis or edema, Homan's sign negative  Neurologic: grossly nonfocal Psychologic: Normal affect and mood. He did get depressed initially following his wife's death.  ECG: Sinus rhythm at 68beats per minute. There is mild RV conduction delay. Intervals were normal.  LABS:  BMET    Component Value Date/Time   NA 142 10/01/2010 1113   K 4.5 10/01/2010 1113   CL 106 10/01/2010 1113   CO2 29 10/01/2010 1113   GLUCOSE 106* 10/01/2010 1113   BUN 26* 10/01/2010 1113   CREATININE 1.1 10/01/2010 1113   CALCIUM 8.8 10/01/2010 1113   GFRNONAA 67.29 12/27/2009 0909   GFRAA 86 07/07/2007 1044     Hepatic Function Panel     Component Value Date/Time   PROT 7.3 04/25/2007 1127   ALBUMIN 4.2 04/25/2007 1127   AST 14 04/25/2007 1127   ALT 13 04/25/2007 1127   ALKPHOS 66 04/25/2007 1127   BILITOT 0.4 04/25/2007 1127   BILIDIR 0.2  04/08/2007 1000     CBC    Component Value Date/Time   WBC 5.1 12/27/2009 0909   WBC 6.1 04/25/2007 1127   RBC 4.00* 12/27/2009 0909   RBC 4.16* 04/25/2007 1127   HGB 12.8* 12/27/2009 0909   HGB 13.0 04/25/2007 1127   HCT 37.6* 12/27/2009 0909   HCT 37.0* 04/25/2007 1127   PLT 140.0* 12/27/2009 0909   PLT 227 04/25/2007 1127   MCV 94.0 12/27/2009 0909   MCV 89.0 04/25/2007 1127   MCH 31.3 04/25/2007 1127  MCHC 34.1 12/27/2009 0909   MCHC 35.2 04/25/2007 1127   RDW 13.7 12/27/2009 0909   RDW 13.8 04/25/2007 1127   LYMPHSABS 1.9 12/27/2009 0909   LYMPHSABS 2.5 04/25/2007 1127   MONOABS 0.4 12/27/2009 0909   MONOABS 0.6 04/25/2007 1127   EOSABS 0.2 12/27/2009 0909   EOSABS 0.0 04/25/2007 1127   BASOSABS 0.0 12/27/2009 0909   BASOSABS 0.0 04/25/2007 1127     BNP No results found for this basename: probnp    Lipid Panel     Component Value Date/Time   CHOL 165 04/17/2008 0911   TRIG 245* 04/08/2007 1000   HDL 32.20* 04/17/2008 0911   CHOLHDL 5.3 CALC 04/08/2007 1000   VLDL 49* 04/08/2007 1000     RADIOLOGY: No results found.    ASSESSMENT AND PLAN: From a cardiac standpoint, Mr. Broughton continues to do very well and is now 12 years status post his CABG revascularization surgery. His last catheterization was 10 years ago for recurrent chest pain which continued to show patent grafts. His ejection fraction is 40-45%. Presently he is doing well. He has more energy with his 25 pound weight loss. His blood pressure is well-controlled. He never did obtain laboratory which we have scheduled for him in June. I am requesting a repeat laboratory be obtained in the fasting state consisting of a CBC, CMP, and MR lipoprotein, TSH I did provide him with samples of Vytorin 10/40 today since he is in the "donut hole" and the cause was significant. I've encouraged increased activity. I will contact him regarding laboratory results. I will see him in 6 months for cardiology reassessment.   Lennette Bihari, MD, [facility]    12/13/2012 9:44 AM

## 2012-12-22 ENCOUNTER — Ambulatory Visit (INDEPENDENT_AMBULATORY_CARE_PROVIDER_SITE_OTHER): Payer: Medicare Other | Admitting: *Deleted

## 2012-12-22 DIAGNOSIS — D519 Vitamin B12 deficiency anemia, unspecified: Secondary | ICD-10-CM

## 2012-12-22 DIAGNOSIS — D518 Other vitamin B12 deficiency anemias: Secondary | ICD-10-CM

## 2012-12-22 MED ORDER — CYANOCOBALAMIN 1000 MCG/ML IJ SOLN
1000.0000 ug | INTRAMUSCULAR | Status: AC
  Administered 2012-12-22: 1000 ug via INTRAMUSCULAR

## 2013-01-23 ENCOUNTER — Ambulatory Visit (INDEPENDENT_AMBULATORY_CARE_PROVIDER_SITE_OTHER): Payer: Medicare Other | Admitting: Internal Medicine

## 2013-01-23 DIAGNOSIS — D518 Other vitamin B12 deficiency anemias: Secondary | ICD-10-CM

## 2013-01-23 DIAGNOSIS — D519 Vitamin B12 deficiency anemia, unspecified: Secondary | ICD-10-CM

## 2013-01-23 MED ORDER — CYANOCOBALAMIN 1000 MCG/ML IJ SOLN
1000.0000 ug | INTRAMUSCULAR | Status: AC
  Administered 2013-01-23: 1000 ug via INTRAMUSCULAR

## 2013-02-06 ENCOUNTER — Ambulatory Visit: Payer: Medicare Other | Admitting: Internal Medicine

## 2013-02-10 ENCOUNTER — Ambulatory Visit (INDEPENDENT_AMBULATORY_CARE_PROVIDER_SITE_OTHER): Payer: Medicare Other | Admitting: Internal Medicine

## 2013-02-10 ENCOUNTER — Telehealth: Payer: Self-pay | Admitting: Internal Medicine

## 2013-02-10 ENCOUNTER — Other Ambulatory Visit: Payer: Self-pay | Admitting: *Deleted

## 2013-02-10 ENCOUNTER — Encounter: Payer: Self-pay | Admitting: Internal Medicine

## 2013-02-10 VITALS — BP 140/86 | HR 72 | Temp 98.2°F | Resp 16 | Ht 69.0 in | Wt 189.0 lb

## 2013-02-10 DIAGNOSIS — E785 Hyperlipidemia, unspecified: Secondary | ICD-10-CM

## 2013-02-10 DIAGNOSIS — M545 Low back pain, unspecified: Secondary | ICD-10-CM

## 2013-02-10 DIAGNOSIS — M19012 Primary osteoarthritis, left shoulder: Secondary | ICD-10-CM

## 2013-02-10 DIAGNOSIS — I10 Essential (primary) hypertension: Secondary | ICD-10-CM

## 2013-02-10 DIAGNOSIS — R918 Other nonspecific abnormal finding of lung field: Secondary | ICD-10-CM

## 2013-02-10 DIAGNOSIS — M19019 Primary osteoarthritis, unspecified shoulder: Secondary | ICD-10-CM

## 2013-02-10 LAB — BASIC METABOLIC PANEL
BUN: 17 mg/dL (ref 6–23)
CHLORIDE: 103 meq/L (ref 96–112)
CO2: 33 mEq/L — ABNORMAL HIGH (ref 19–32)
Calcium: 9.1 mg/dL (ref 8.4–10.5)
Creatinine, Ser: 1 mg/dL (ref 0.4–1.5)
GFR: 77.45 mL/min (ref >60.00)
GLUCOSE: 81 mg/dL (ref 70–99)
POTASSIUM: 4.1 meq/L (ref 3.5–5.1)
SODIUM: 141 meq/L (ref 135–145)

## 2013-02-10 MED ORDER — SULINDAC 150 MG PO TABS: 150.0000 mg | ORAL_TABLET | Freq: Two times a day (BID) | ORAL | Status: DC

## 2013-02-10 MED ORDER — OXYCODONE-ACETAMINOPHEN 10-325 MG PO TABS: 1.0000 | ORAL_TABLET | Freq: Two times a day (BID) | ORAL | Status: DC | PRN

## 2013-02-10 MED ORDER — SIMVASTATIN 40 MG PO TABS: 40.0000 mg | ORAL_TABLET | Freq: Every day | ORAL | Status: DC

## 2013-02-10 MED ORDER — EZETIMIBE 10 MG PO TABS: 10.0000 mg | ORAL_TABLET | Freq: Every day | ORAL | Status: DC

## 2013-02-10 MED ORDER — NITROGLYCERIN 0.4 MG SL SUBL: 0.4000 mg | SUBLINGUAL_TABLET | SUBLINGUAL | Status: DC | PRN

## 2013-02-10 NOTE — Patient Instructions (Addendum)
darvocet at bedtime prn  To replace vytorin  Simvastatin 40 and Zetia 10

## 2013-02-10 NOTE — Addendum Note (Signed)
Addended by: Allyne Gee on: 02/10/2013 04:07 PM   Modules accepted: Orders

## 2013-02-10 NOTE — Progress Notes (Signed)
   Subjective:    Patient ID: [name], male    DOB: [birth year], 73 y.o.   MRN: [id number]  HPI Increased pian in joints, neck, back and knees Multiple back surgeries and neck surgeries and he is not interested in more surgery at this time. Currently using the percocet 10/325 He has darvocet at home  And these have worked better. The percocet is not his choice.   Review of Systems     Objective:   Physical Exam        Assessment & Plan:  Discussion of  pain management:  Now on a percocet .5  To TID And darvocet at bedtime  Blood pressure stable On effexor 150 every other day  Failed lyric and gabapentin in past  Lodine change

## 2013-02-10 NOTE — Progress Notes (Signed)
Pre visit review using our clinic review tool, if applicable. No additional management support is needed unless otherwise documented below in the visit note. 

## 2013-02-10 NOTE — Telephone Encounter (Signed)
done

## 2013-02-10 NOTE — Addendum Note (Signed)
Addended by: Allyne Gee on: 02/10/2013 02:20 PM   Modules accepted: Orders

## 2013-02-10 NOTE — Telephone Encounter (Signed)
Pt came back because he realized that he forgot that he also needed his oxyCODONE-acetaminophen (PERCOCET) 10-325 MG per tablet refill. Please call. Thanks!

## 2013-02-22 ENCOUNTER — Telehealth: Payer: Self-pay | Admitting: Internal Medicine

## 2013-02-22 NOTE — Telephone Encounter (Signed)
Relevant patient education mailed to patient.  

## 2013-03-03 ENCOUNTER — Ambulatory Visit (INDEPENDENT_AMBULATORY_CARE_PROVIDER_SITE_OTHER): Payer: Medicare Other | Admitting: Internal Medicine

## 2013-03-03 DIAGNOSIS — D519 Vitamin B12 deficiency anemia, unspecified: Secondary | ICD-10-CM

## 2013-03-03 DIAGNOSIS — D518 Other vitamin B12 deficiency anemias: Secondary | ICD-10-CM

## 2013-03-03 MED ORDER — CYANOCOBALAMIN 1000 MCG/ML IJ SOLN
1000.0000 ug | INTRAMUSCULAR | Status: AC
  Administered 2013-03-03: 1000 ug via INTRAMUSCULAR

## 2013-03-06 ENCOUNTER — Telehealth: Payer: Self-pay | Admitting: Internal Medicine

## 2013-03-06 ENCOUNTER — Other Ambulatory Visit: Payer: Self-pay | Admitting: Internal Medicine

## 2013-03-06 NOTE — Telephone Encounter (Signed)
Pt states he was prescribed simvastatin (ZOCOR) 40 MG tablet and ezetimibe (ZETIA) 10 MG tablet due the cost of Vytorin.  However, pt states the ezetimibe (ZETIA) 10 MG tablet will cost him $210, which is more than the Vytorin itself.  Pt requesting alternate med.

## 2013-03-06 NOTE — Telephone Encounter (Signed)
Just take simvastatin per dr Arnoldo Morale Left message on machine for pt

## 2013-03-11 ENCOUNTER — Other Ambulatory Visit: Payer: Self-pay | Admitting: Cardiovascular Disease

## 2013-03-13 NOTE — Telephone Encounter (Signed)
E sentrx 

## 2013-03-23 ENCOUNTER — Telehealth: Payer: Self-pay | Admitting: Internal Medicine

## 2013-03-23 NOTE — Telephone Encounter (Signed)
Pt needs a PA on vytorin 10-40 call [phone] [facility]. This  Med is not on pt med list

## 2013-03-23 NOTE — Telephone Encounter (Signed)
Tier exception request has been submitted via phone, [id number]. Pt is aware.

## 2013-03-30 LAB — COMPREHENSIVE METABOLIC PANEL
ALT: 14 U/L (ref 0–53)
AST: 19 U/L (ref 0–37)
Albumin: 4.1 g/dL (ref 3.5–5.2)
Alkaline Phosphatase: 66 U/L (ref 39–117)
BUN: 18 mg/dL (ref 6–23)
CO2: 31 mEq/L (ref 19–32)
Calcium: 9.2 mg/dL (ref 8.4–10.5)
Chloride: 103 mEq/L (ref 96–112)
Creat: 1.03 mg/dL (ref 0.50–1.35)
Glucose, Bld: 106 mg/dL — ABNORMAL HIGH (ref 70–99)
Potassium: 4.3 mEq/L (ref 3.5–5.3)
Sodium: 139 mEq/L (ref 135–145)
Total Bilirubin: 0.8 mg/dL (ref 0.2–1.2)
Total Protein: 6.8 g/dL (ref 6.0–8.3)

## 2013-03-30 LAB — CBC
HCT: 40 % (ref 39.0–52.0)
Hemoglobin: 13.4 g/dL (ref 13.0–17.0)
MCH: 29.2 pg (ref 26.0–34.0)
MCHC: 33.5 g/dL (ref 30.0–36.0)
MCV: 87.1 fL (ref 78.0–100.0)
Platelets: 154 10*3/uL (ref 150–400)
RBC: 4.59 MIL/uL (ref 4.22–5.81)
RDW: 13.6 % (ref 11.5–15.5)
WBC: 6 10*3/uL (ref 4.0–10.5)

## 2013-03-30 LAB — TSH: TSH: 1.603 u[IU]/mL (ref 0.350–4.500)

## 2013-03-31 ENCOUNTER — Other Ambulatory Visit: Payer: Self-pay | Admitting: *Deleted

## 2013-03-31 LAB — NMR LIPOPROFILE WITH LIPIDS
Cholesterol, Total: 116 mg/dL (ref <200)
HDL Particle Number: 28 umol/L — ABNORMAL LOW (ref >=30.5)
HDL Size: 8.5 nm — ABNORMAL LOW (ref >=9.2)
HDL-C: 34 mg/dL — ABNORMAL LOW (ref >=40)
LDL (calc): 66 mg/dL (ref <100)
LDL Particle Number: 1190 nmol/L — ABNORMAL HIGH (ref <1000)
LDL Size: 19.9 nm — ABNORMAL LOW (ref >20.5)
LP-IR Score: 67 — ABNORMAL HIGH (ref <=45)
Large HDL-P: 1.9 umol/L — ABNORMAL LOW (ref >=4.8)
Large VLDL-P: 2 nmol/L (ref <=2.7)
Small LDL Particle Number: 859 nmol/L — ABNORMAL HIGH (ref <=527)
Triglycerides: 80 mg/dL (ref <150)
VLDL Size: 48 nm — ABNORMAL HIGH (ref <=46.6)

## 2013-03-31 MED ORDER — EZETIMIBE-SIMVASTATIN 10-40 MG PO TABS: 1.0000 | ORAL_TABLET | Freq: Every day | ORAL | Status: DC

## 2013-04-11 ENCOUNTER — Encounter: Payer: Self-pay | Admitting: Gastroenterology

## 2013-04-11 ENCOUNTER — Other Ambulatory Visit: Payer: Self-pay | Admitting: Internal Medicine

## 2013-04-13 ENCOUNTER — Other Ambulatory Visit: Payer: Self-pay | Admitting: *Deleted

## 2013-04-13 MED ORDER — EZETIMIBE-SIMVASTATIN 10-40 MG PO TABS: 1.0000 | ORAL_TABLET | Freq: Every day | ORAL | Status: DC

## 2013-04-17 ENCOUNTER — Telehealth: Payer: Self-pay | Admitting: *Deleted

## 2013-04-17 NOTE — Telephone Encounter (Signed)
Left message for patient to return call.

## 2013-04-24 ENCOUNTER — Encounter: Payer: Self-pay | Admitting: Gastroenterology

## 2013-04-24 ENCOUNTER — Telehealth: Payer: Self-pay | Admitting: *Deleted

## 2013-04-24 NOTE — Telephone Encounter (Signed)
Informed patient of lab results and Dr. Evette Georges recommendations. Patient voice understanding of instructions given.

## 2013-04-24 NOTE — Telephone Encounter (Signed)
Pt was calling in regards to blood work. He stated that you called him last week.  TK

## 2013-05-08 ENCOUNTER — Other Ambulatory Visit: Payer: Self-pay | Admitting: Internal Medicine

## 2013-05-22 ENCOUNTER — Telehealth: Payer: Self-pay | Admitting: Internal Medicine

## 2013-05-22 ENCOUNTER — Ambulatory Visit (INDEPENDENT_AMBULATORY_CARE_PROVIDER_SITE_OTHER): Payer: Medicare Other | Admitting: Internal Medicine

## 2013-05-22 DIAGNOSIS — E538 Deficiency of other specified B group vitamins: Secondary | ICD-10-CM

## 2013-05-22 MED ORDER — CYANOCOBALAMIN 1000 MCG/ML IJ SOLN
1000.0000 ug | Freq: Once | INTRAMUSCULAR | Status: AC
  Administered 2013-05-22: 1000 ug via INTRAMUSCULAR

## 2013-05-22 NOTE — Telephone Encounter (Signed)
yes

## 2013-05-22 NOTE — Telephone Encounter (Signed)
Pt long time pt of dr Arnoldo Morale and would like to know if you will accept him?

## 2013-05-23 NOTE — Telephone Encounter (Signed)
lmom for pt to sch °

## 2013-05-23 NOTE — Telephone Encounter (Signed)
Pt aware and states needs appt asap due to health concerns.

## 2013-05-29 ENCOUNTER — Ambulatory Visit (INDEPENDENT_AMBULATORY_CARE_PROVIDER_SITE_OTHER): Payer: Medicare Other | Admitting: Internal Medicine

## 2013-05-29 ENCOUNTER — Encounter: Payer: Self-pay | Admitting: Internal Medicine

## 2013-05-29 VITALS — BP 120/80 | HR 63 | Temp 97.9°F | Resp 20 | Ht 68.0 in | Wt 186.0 lb

## 2013-05-29 DIAGNOSIS — I2510 Atherosclerotic heart disease of native coronary artery without angina pectoris: Secondary | ICD-10-CM

## 2013-05-29 DIAGNOSIS — I10 Essential (primary) hypertension: Secondary | ICD-10-CM

## 2013-05-29 DIAGNOSIS — E785 Hyperlipidemia, unspecified: Secondary | ICD-10-CM

## 2013-05-29 DIAGNOSIS — Z0000 Encounter for general adult medical examination without abnormal findings: Secondary | ICD-10-CM

## 2013-05-29 DIAGNOSIS — M545 Low back pain, unspecified: Secondary | ICD-10-CM

## 2013-05-29 DIAGNOSIS — Z23 Encounter for immunization: Secondary | ICD-10-CM

## 2013-05-29 DIAGNOSIS — G2581 Restless legs syndrome: Secondary | ICD-10-CM

## 2013-05-29 DIAGNOSIS — G608 Other hereditary and idiopathic neuropathies: Secondary | ICD-10-CM

## 2013-05-29 DIAGNOSIS — I255 Ischemic cardiomyopathy: Secondary | ICD-10-CM

## 2013-05-29 NOTE — Progress Notes (Signed)
Subjective:    Patient ID: [name], male    DOB: Jan 06, 1941, 73 y.o.   MRN: [id number]  HPI 73 year old patient who is seen today for an annual physical.  He is a former patient [name] and is transferring his care He is followed by multiple specialists including cardiology urology, and GI. Doing well today except for chronic pain.  She complains left shoulder pain and bilateral leg pain, left greater than the right.  He is on a 3 times a day regimen of oxycodone.  Social history he is a Theme park manager.  Wife died in a motor vehicle accident a couple of years ago.  Former smoker  Family history.  Both parents died at approximately age 71 father from complications of 3 vascular disease and mother from complications of a melanoma.  One brother with obesity  Past Medical History  Diagnosis Date  . Esophagitis   . Colon polyps   . History of bladder cancer   . Esophagus, Barrett's   . Stenosis of esophagus   . Localized osteoarthrosis, lower leg   . Malaise and fatigue   . Hypothyroidism   . Hypertension   . Anemia   . B12 deficiency anemia   . GERD (gastroesophageal reflux disease)   . Hyperlipidemia   . Depression   . Allergy   . CAD (coronary artery disease)   . COPD (chronic obstructive pulmonary disease)   . Low back pain     History   Social History  . Marital Status: Married    Spouse Name: N/A    Number of Children: N/A  . Years of Education: N/A   Occupational History  . Not on file.   Social History Main Topics  . Smoking status: Former Smoker    Quit date: 03/30/1973  . Smokeless tobacco: Not on file     Comment: quit in 1977  . Alcohol Use: No  . Drug Use: No  . Sexual Activity: Yes   Other Topics Concern  . Not on file   Social History Narrative  . No narrative on file    Past Surgical History  Procedure Laterality Date  . Coronary artery bypass graft      x4  . Cervical discectomy      ACDF  . Lumbar laminectomy      and fusion  .  Bladder cancer    . Popliteal synovial cyst excision    . Knee arthroscopy      Family History  Problem Relation Age of Onset  . Coronary artery disease    . Cancer Mother     melanoma  . Stroke Father   . Hypertension Father     No Known Allergies  Current Outpatient Prescriptions on File Prior to Visit  Medication Sig Dispense Refill  . aspirin 81 MG tablet Take 81 mg by mouth daily.        [name] esomeprazole (NEXIUM) 40 MG capsule Take 1 capsule (40 mg total) by mouth 2 (two) times daily.  30 capsule  11  . etodolac (LODINE) 300 MG capsule TAKE ONE CAPSULE BY MOUTH EVERY 8 HOURS  60 capsule  0  . ezetimibe-simvastatin (VYTORIN) 10-40 MG per tablet Take 1 tablet by mouth daily.  90 tablet  3  . folic acid (FOLVITE) 702 MCG tablet Take 400 mcg by mouth daily.        . isosorbide mononitrate (IMDUR) 60 MG 24 hr tablet TAKE ONE TABLET BY MOUTH DAILY.  [place]  tablet  3  . losartan (COZAAR) 50 MG tablet TAKE ONE TABLET BY MOUTH ONCE DAILY  30 tablet  6  . nebivolol (BYSTOLIC) 10 MG tablet Take 5 mg by mouth daily.       . nitroGLYCERIN (NITROSTAT) 0.4 MG SL tablet Place 1 tablet (0.4 mg total) under the tongue every 5 (five) minutes as needed for chest pain.  30 tablet  3  . oxyCODONE-acetaminophen (PERCOCET) 10-325 MG per tablet Take 1 tablet by mouth 2 (two) times daily as needed.  60 tablet  0  . rOPINIRole (REQUIP) 2 MG tablet TAKE ONE TABLET BY MOUTH AT BEDTIME  90 tablet  1  . venlafaxine (EFFEXOR) 75 MG tablet TAKE ONE TABLET BY MOUTH DAILY.  90 tablet  3   No current facility-administered medications on file prior to visit.    BP 120/80  Pulse 63  Temp(Src) 97.9 F (36.6 C) (Oral)  Resp 20  Ht 5\' 8"  (1.727 m)  Wt 186 lb (84.369 kg)  BMI 28.29 kg/m2  SpO2 98%   1. Risk factors, based on past  M,S,F history patient has known coronary artery disease.  He has dyslipidemia and hypertension-   2.  Physical activities:Limited due to orthopedic complaints and especially leg pain  and peripheral neuropathy and osteoarthritis   3.  Depression/mood:History depression, stable on medication   4.  Hearing:No deficits   5.  ADL's:Independent in all aspects of daily living   6.  Fall risk:Slightly increased due to peripheral neuropathy   7.  Home safety:No problems identified   8.  Height weight, and visual acuity;Height and weight stable.  No change in visual acuity   9.  Counseling:Heart healthy diet.  Encouraged ongoing weight loss recommended   10. Lab orders based on risk factors:Recent laboratory studies reviewed   11. Referral :Followup GI for colonoscopy.  Followup urology and cardiology   12. Care plan:Continue aggressive risk factor modification   13. Cognitive assessment: Alert and oriented with normal affect.  No cognitive dysfunction       Review of Systems  Constitutional: Negative for fever, chills, activity change, appetite change and fatigue.  HENT: Negative for congestion, dental problem, ear pain, hearing loss, mouth sores, rhinorrhea, sinus pressure, sneezing, tinnitus, trouble swallowing and voice change.   Eyes: Negative for photophobia, pain, redness and visual disturbance.  Respiratory: Negative for apnea, cough, choking, chest tightness, shortness of breath and wheezing.   Cardiovascular: Negative for chest pain, palpitations and leg swelling.  Gastrointestinal: Negative for nausea, vomiting, abdominal pain, diarrhea, constipation, blood in stool, abdominal distention, anal bleeding and rectal pain.  Genitourinary: Negative for dysuria, urgency, frequency, hematuria, flank pain, decreased urine volume, discharge, penile swelling, scrotal swelling, difficulty urinating, genital sores and testicular pain.  Musculoskeletal: Positive for arthralgias, back pain, gait problem and neck stiffness. Negative for joint swelling, myalgias and neck pain.  Skin: Negative for color change, rash and wound.  Neurological: Negative for dizziness,  tremors, seizures, syncope, facial asymmetry, speech difficulty, weakness, light-headedness, numbness and headaches.  Hematological: Negative for adenopathy. Does not bruise/bleed easily.  Psychiatric/Behavioral: Negative for suicidal ideas, hallucinations, behavioral problems, confusion, sleep disturbance, self-injury, dysphoric mood, decreased concentration and agitation. The patient is not nervous/anxious.        Objective:   Physical Exam  Constitutional: He appears well-developed and well-nourished.  HENT:  Head: Normocephalic and atraumatic.  Right Ear: External ear normal.  Left Ear: External ear normal.  Nose: Nose normal.  Mouth/Throat: Oropharynx is clear  and moist.  Eyes: Conjunctivae and EOM are normal. Pupils are equal, round, and reactive to light. No scleral icterus.  Neck: Normal range of motion. Neck supple. No JVD present. No thyromegaly present.  Cardiovascular: Regular rhythm and normal heart sounds.  Exam reveals no gallop and no friction rub.   No murmur heard. Dorsalis pedis pulses intact.  Posterior tibial pulses not easily palpable  Pulmonary/Chest: Effort normal and breath sounds normal. He exhibits no tenderness.  Status post sternotomy  Abdominal: Soft. Bowel sounds are normal. He exhibits no distension and no mass. There is no tenderness.  Genitourinary: Penis normal.  Prosthesis in place  Musculoskeletal: Normal range of motion. He exhibits no edema and no tenderness.  Lymphadenopathy:    He has no cervical adenopathy.  Neurological: He is alert. He has normal reflexes. No cranial nerve deficit. Coordination normal.  Absent Achilles reflexes Decreased vibratory sensation distal legs  Skin: Skin is warm and dry. No rash noted.  Surgical scars, left knee, and right elbow  Psychiatric: He has a normal mood and affect. His behavior is normal.          Assessment & Plan:  Preventive health examination Coronary artery disease.  Followup  cardiology Dyslipidemia.  Continue statin therapy Hypothyroidism Hypertension Depression stable Chronic pain syndrome Peripheral neuropathy, history of restless leg syndrome Barrett's esophagus.  Followup GI History of bladder cancer.  Followup urology

## 2013-05-29 NOTE — Patient Instructions (Signed)
Limit your sodium (Salt) intake   You need to lose weight.  Consider a lower calorie diet and regular exercise. 

## 2013-06-05 ENCOUNTER — Encounter: Payer: Self-pay | Admitting: Cardiovascular Disease

## 2013-06-05 ENCOUNTER — Ambulatory Visit (INDEPENDENT_AMBULATORY_CARE_PROVIDER_SITE_OTHER): Payer: Medicare Other | Admitting: Cardiovascular Disease

## 2013-06-05 VITALS — BP 138/62 | HR 63 | Ht 69.0 in | Wt 185.8 lb

## 2013-06-05 DIAGNOSIS — G2581 Restless legs syndrome: Secondary | ICD-10-CM

## 2013-06-05 DIAGNOSIS — I2510 Atherosclerotic heart disease of native coronary artery without angina pectoris: Secondary | ICD-10-CM

## 2013-06-05 DIAGNOSIS — Z951 Presence of aortocoronary bypass graft: Secondary | ICD-10-CM | POA: Insufficient documentation

## 2013-06-05 DIAGNOSIS — E039 Hypothyroidism, unspecified: Secondary | ICD-10-CM

## 2013-06-05 DIAGNOSIS — I10 Essential (primary) hypertension: Secondary | ICD-10-CM

## 2013-06-05 DIAGNOSIS — E785 Hyperlipidemia, unspecified: Secondary | ICD-10-CM

## 2013-06-05 NOTE — Progress Notes (Signed)
Patient ID: [name], male   DOB: 1940-01-23, 73 y.o.   MRN: [id number]      HPI: [name] is a 73 y.o. male who presents to the office today for a 6 month cardiology followup evaluation.  [name] has known CAD and underwent CABG revascularization surgery LIMA to his LAD, vein to the marginal, vein to the RCA. Catheterization in 2004 showed patent grafts. An echo Doppler study in August 2012 ejection fraction of 40-45% with moderate septal hypokinesis with grade 1 diastolic dysfunction as well as aortic valve sclerosis. Additional problems include GERD, mild emphysema, as well as obstructive sleep apnea. He no longer utilizes CPAP. He has lost approximately 25 pounds since his wife's death from a motor vehicle accident in January 2013 to He remains active. Still notes mild shortness of breath with activity he does have issues with cramps in his legs typically at night. He also is a history of restless leg syndrome and has benefited with requip. His last blood work was done  in December 2013. At that time, LDL was 57 with LDL particle #1035, small LDL particle number was 611, slightly increased, HDLC was 35 triglycerides 93 total cholesterol 111 initial resistance course slightly elevated at 53.  BP blood work in March 2015 revealed normal renal function.  Fasting glucose was minimally increased at 106.  Total cholesterol was 116 triglycerides 80, and LDL 66, with an HDL of 34.  LDL particle number was slightly increased at 1190. Over the past 6 months [name] has felt well. He denies recurrent anginal symptoms.  He denies any palpitations.  There is no presyncope or syncope.  His last myocardial perfusion study was done in August 2012. 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  Past Medical History  Diagnosis Date  . Esophagitis   . Colon polyps   . History of bladder cancer   . Esophagus, Barrett's   .  Stenosis of esophagus   . Localized osteoarthrosis, lower leg   . Malaise and fatigue   . Hypothyroidism   . Hypertension   . Anemia   . B12 deficiency anemia   . GERD (gastroesophageal reflux disease)   . Hyperlipidemia   . Depression   . Allergy   . CAD (coronary artery disease)   . COPD (chronic obstructive pulmonary disease)   . Low back pain     Past Surgical History  Procedure Laterality Date  . Coronary artery bypass graft      x4  . Cervical discectomy      ACDF  . Lumbar laminectomy      and fusion  . Bladder cancer    . Popliteal synovial cyst excision    . Knee arthroscopy      Not on File  Current Outpatient Prescriptions  Medication Sig Dispense Refill  . aspirin 81 MG tablet Take 81 mg by mouth daily.        [name] esomeprazole (NEXIUM) 40 MG capsule Take 1 capsule (40 mg total) by mouth 2 (two) times daily.  30 capsule  11  . etodolac (LODINE) 300 MG capsule TAKE ONE CAPSULE BY MOUTH EVERY 8 HOURS  60 capsule  0  . ezetimibe-simvastatin (VYTORIN) 10-40 MG per tablet Take 1 tablet by mouth daily.  90 tablet  3  . folic acid (FOLVITE) 242 MCG tablet Take 400 mcg by mouth daily.        [name]  isosorbide mononitrate (IMDUR) 60 MG 24 hr tablet TAKE ONE TABLET BY MOUTH DAILY.  180 tablet  3  . losartan (COZAAR) 50 MG tablet TAKE ONE TABLET BY MOUTH ONCE DAILY  30 tablet  6  . nebivolol (BYSTOLIC) 10 MG tablet Take 5 mg by mouth daily.       . nitroGLYCERIN (NITROSTAT) 0.4 MG SL tablet Place 1 tablet (0.4 mg total) under the tongue every 5 (five) minutes as needed for chest pain.  30 tablet  3  . oxyCODONE-acetaminophen (PERCOCET) 10-325 MG per tablet Take 1 tablet by mouth 2 (two) times daily as needed.  60 tablet  0  . rOPINIRole (REQUIP) 2 MG tablet TAKE ONE TABLET BY MOUTH AT BEDTIME  90 tablet  1  . venlafaxine (EFFEXOR) 75 MG tablet TAKE ONE TABLET BY MOUTH DAILY.  90 tablet  3   No current facility-administered medications for this visit.   Socially he is widowed  in January 2013. He has 3 children 6 grandchildren. 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 There is no recent tobacco use.  no alcohol  ROS General: Negative; No fevers, chills, or night sweats;  HEENT: Negative; No changes in vision or hearing, sinus congestion, difficulty swallowing Pulmonary: Negative; No cough, wheezing, shortness of breath, hemoptysis Cardiovascular: Negative; No chest pain, presyncope, syncope, palpatations.  Minimal shortness of breath with activity GI: GERD; No nausea, vomiting, diarrhea, or abdominal pain GU: Negative; No dysuria, hematuria, or difficulty voiding Musculoskeletal: Positive for multiple joint aches involving his legs, hips, back, and shoulder Hematologic/Oncology: Negative; no easy bruising, bleeding Endocrine: Negative; no heat/cold intolerance; no diabetes Neuro: Negative; no changes in balance, headaches Skin: Negative; No rashes or skin lesions Psychiatric: Negative; No behavioral problems, depression Sleep: He no longer uses CPAP with his 25 pound weight loss.  His restless legs improved with low-dose Requip; No snoring, daytime sleepiness, hypersomnolence, bruxism, hypnogognic hallucinations, no cataplexy Other comprehensive 14 point system review is negative.   PE BP 138/62  Pulse 63  Ht 5\' 9"  (1.753 m)  Wt 185 lb 12.8 oz (84.278 kg)  BMI 27.43 kg/m2  General: Alert, oriented, no distress.  HEENT: Normocephalic, atraumatic. Pupils round and reactive; sclera anicteric; Nose without nasal septal hypertrophy Mouth/Parynx benign; Mallinpatti scale 3 Neck: No JVD, no carotid bruits with normal upstroke Lungs: clear to ausculatation and percussion; no wheezing or rales Chest wall: Nontender to palpation Heart: RRR, s1 s2 normal 1/6 systolic murmur.  No S3 or S4 gallop.  No diastolic murmur.. Abdomen: soft, nontender; no hepatosplenomehaly, BS+; abdominal aorta  nontender and not dilated by palpation. Pulses 2+ Extremities: no clubbinbg cyanosis or edema, Homan's sign negative  Neurologic: grossly nonfocal Psychologic: Normal affect and mood. He did get depressed initially following his wife's death, but this has improved  ECG (independently read by me): Normal sinus rhythm at 63 beats per minute.  No ectopy; normal intervals.  Prior November 2014 ECG: Sinus rhythm at 68beats per minute. There is mild RV conduction delay. Intervals were normal.  LABS:  BMET    Component Value Date/Time   NA 139 03/30/2013 1053   K 4.3 03/30/2013 1053   CL 103 03/30/2013 1053   CO2 31 03/30/2013 1053   GLUCOSE 106* 03/30/2013 1053   BUN 18 03/30/2013 1053   CREATININE 1.03 03/30/2013 1053   CREATININE 1.0 02/10/2013 1414   CALCIUM 9.2 03/30/2013 1053  [facility] 67.29 12/27/2009 0909   GFRAA 86 07/07/2007 1044     Hepatic Function Panel     Component Value Date/Time   PROT 6.8 03/30/2013 1053   ALBUMIN 4.1 03/30/2013 1053   AST 19 03/30/2013 1053   ALT 14 03/30/2013 1053   ALKPHOS 66 03/30/2013 1053   BILITOT 0.8 03/30/2013 1053   BILIDIR 0.2 04/08/2007 1000     CBC    Component Value Date/Time   WBC 6.0 03/30/2013 1053   WBC 6.1 04/25/2007 1127   RBC 4.59 03/30/2013 1053   RBC 4.16* 04/25/2007 1127   HGB 13.4 03/30/2013 1053   HGB 13.0 04/25/2007 1127   HCT 40.0 03/30/2013 1053   HCT 37.0* 04/25/2007 1127   PLT 154 03/30/2013 1053   PLT 227 04/25/2007 1127   MCV 87.1 03/30/2013 1053   MCV 89.0 04/25/2007 1127   MCH 29.2 03/30/2013 1053   MCH 31.3 04/25/2007 1127   MCHC 33.5 03/30/2013 1053   MCHC 35.2 04/25/2007 1127   RDW 13.6 03/30/2013 1053   RDW 13.8 04/25/2007 1127   LYMPHSABS 1.9 12/27/2009 0909   LYMPHSABS 2.5 04/25/2007 1127   MONOABS 0.4 12/27/2009 0909   MONOABS 0.6 04/25/2007 1127   EOSABS 0.2 12/27/2009 0909   EOSABS 0.0 04/25/2007 1127   BASOSABS 0.0 12/27/2009 0909   BASOSABS 0.0 04/25/2007 1127     BNP No results found for this basename: probnp    Lipid  Panel     Component Value Date/Time   CHOL 165 04/17/2008 0911   TRIG 80 03/30/2013 1053   TRIG 245* 04/08/2007 1000   HDL 32.20* 04/17/2008 0911   CHOLHDL 5.3 CALC 04/08/2007 1000   VLDL 49* 04/08/2007 1000   LDLCALC 66 03/30/2013 1053     RADIOLOGY: No results found.    ASSESSMENT AND PLAN:  Mr. Jorge continues is 13 years status post his CABG revascularization surgery. His last catheterization was 10 years ago for recurrent chest pain which continued to show patent grafts. His ejection fraction is 40-45%. Presently he is doing well. He has more energy with his 25 pound weight loss.  His blood pressure, today we'll recheck by me was 130/70.  He is on Bystolic 5 mg in addition to losartan 50 mg for blood pressure control.  He also is on isosorbide nitrate 60 mg.  He's not having angina symptoms.  He continues to do well with Vytorin 10/40 with his lipid therapy.  LDL particle number was still slightly increased.  He is working.  He now has established primary care with [name].  I reviewed his blood work from March.  He denies nonrestorative sleep.  His restless legs have improved with rectal.  As long as he remains stable.  I will see him in 6 months for cardiology reevaluation to  Troy Sine, MD, [facility]  06/05/2013 9:29 AM

## 2013-06-05 NOTE — Patient Instructions (Signed)
Your physician recommends that you schedule a follow-up appointment in: 6 months  

## 2013-06-13 ENCOUNTER — Other Ambulatory Visit: Payer: Self-pay | Admitting: Internal Medicine

## 2013-06-19 ENCOUNTER — Ambulatory Visit: Payer: Medicare Other | Admitting: Internal Medicine

## 2013-06-23 ENCOUNTER — Ambulatory Visit (INDEPENDENT_AMBULATORY_CARE_PROVIDER_SITE_OTHER): Payer: Medicare Other | Admitting: *Deleted

## 2013-06-23 DIAGNOSIS — E538 Deficiency of other specified B group vitamins: Secondary | ICD-10-CM

## 2013-06-23 MED ORDER — CYANOCOBALAMIN 1000 MCG/ML IJ SOLN
1000.0000 ug | Freq: Once | INTRAMUSCULAR | Status: AC
  Administered 2013-06-23: 1000 ug via INTRAMUSCULAR

## 2013-07-04 ENCOUNTER — Other Ambulatory Visit: Payer: Self-pay | Admitting: Internal Medicine

## 2013-07-11 ENCOUNTER — Encounter: Payer: Self-pay | Admitting: *Deleted

## 2013-07-11 ENCOUNTER — Telehealth: Payer: Self-pay | Admitting: Internal Medicine

## 2013-07-11 DIAGNOSIS — M150 Primary generalized (osteo)arthritis: Principal | ICD-10-CM

## 2013-07-11 DIAGNOSIS — M159 Polyosteoarthritis, unspecified: Secondary | ICD-10-CM

## 2013-07-11 DIAGNOSIS — M8949 Other hypertrophic osteoarthropathy, multiple sites: Secondary | ICD-10-CM

## 2013-07-11 NOTE — Telephone Encounter (Signed)
ok 

## 2013-07-11 NOTE — Telephone Encounter (Signed)
Pt needs referral to see dr Barkley Boards [phone] for osteoarthritis in knees,back and hand. Pt has medicare pri. Can we referral?

## 2013-07-11 NOTE — Telephone Encounter (Signed)
Okay to do referral

## 2013-07-12 NOTE — Telephone Encounter (Signed)
Spoke to pt told asked him what type of doctor? Pt said Rheumatology. Told pt okay will send order for referral to Rheumatology and someone will contact him regarding an appointment. Pt verbalized understanding.

## 2013-07-14 ENCOUNTER — Encounter: Payer: Self-pay | Admitting: Gastroenterology

## 2013-07-14 ENCOUNTER — Ambulatory Visit (INDEPENDENT_AMBULATORY_CARE_PROVIDER_SITE_OTHER): Payer: Medicare Other | Admitting: Gastroenterology

## 2013-07-14 VITALS — BP 124/62 | HR 68 | Ht 69.0 in | Wt 189.0 lb

## 2013-07-14 DIAGNOSIS — Z8719 Personal history of other diseases of the digestive system: Secondary | ICD-10-CM

## 2013-07-14 DIAGNOSIS — K219 Gastro-esophageal reflux disease without esophagitis: Secondary | ICD-10-CM

## 2013-07-14 DIAGNOSIS — R1319 Other dysphagia: Secondary | ICD-10-CM

## 2013-07-14 NOTE — Patient Instructions (Signed)
You have been scheduled for an endoscopy with Dr. Deatra Ina. Please follow written instructions given to you at your visit today. If you use inhalers (even only as needed), please bring them with you on the day of your procedure. Your physician has requested that you go to www.startemmi.com and enter the access code given to you at your visit today. This web site gives a general overview about your procedure. However, you should still follow specific instructions given to you by our office regarding your preparation for the procedure.

## 2013-07-14 NOTE — Progress Notes (Signed)
Reviewed and agree with management. Robert D. Kaplan, M.D., FACG  

## 2013-07-14 NOTE — Progress Notes (Signed)
07/14/2013 [name] [id number] [birth year]   HISTORY OF PRESENT ILLNESS:  This is a pleasant 73 year old male who is known to Dr. Deatra Ina.  He presents to our office today with complaints of dysphagia to solid food.  He says that hot dogs and watermelon in particular are culprits, but other things get stuck as well.  He has a history of significant GERD and questionable history of Barrett's esophagus. Last EGD was in April 2012 at which time he was found to have mild gastritis, with suspected Barrett's esophagus, and a stricture at the GE junction status post [facility] dilation. His esophageal biopsies actually did not show any Barrett's/intestinal metaplasia.  Biopsies from EGD in 2009 also were negative for Barrett's on pathology.  He does take Nexium 40 mg twice daily, which over all controls his reflux symptoms well.  He says that without the medication he can barely drink water without getting symptoms.  He says that the last endoscopy with dilation allowed only very minimal improvement in his swallowing symptoms. The dysphagia has been persistent and worsening since then.  Sometimes the food will eventually go down, but sometimes he actually has to vomit the contents back up.   Past Medical History  Diagnosis Date  . Esophagitis   . Colon polyps   . History of bladder cancer   . Esophagus, Barrett's   . Stenosis of esophagus   . Localized osteoarthrosis, lower leg   . Malaise and fatigue   . Hypothyroidism   . Hypertension   . Anemia   . B12 deficiency anemia   . GERD (gastroesophageal reflux disease)   . Hyperlipidemia   . Depression   . Allergy   . CAD (coronary artery disease)   . COPD (chronic obstructive pulmonary disease)   . Low back pain    Past Surgical History  Procedure Laterality Date  . Coronary artery bypass graft      x4  . Cervical discectomy      ACDF  . Lumbar laminectomy      and fusion  . Bladder cancer    . Popliteal synovial cyst excision     . Knee arthroscopy    . Esophagogastroduodenoscopy  04/29/2010  . Colonoscopy  11/17/2005    normal     reports that he quit smoking about 40 years ago. He has never used smokeless tobacco. He reports that he does not drink alcohol or use illicit drugs. family history includes Cancer in his mother; Coronary artery disease in an other family member; Hypertension in his father; Stroke in his father. There is no history of Colon cancer or Esophageal cancer. No Known Allergies    Outpatient Encounter Prescriptions as of 07/14/2013  Medication Sig  . aspirin 81 MG tablet Take 81 mg by mouth daily.    [name] esomeprazole (NEXIUM) 40 MG capsule Take 1 capsule (40 mg total) by mouth 2 (two) times daily.  [name] etodolac (LODINE) 300 MG capsule TAKE ONE CAPSULE BY MOUTH EVERY 8 HOURS  . ezetimibe-simvastatin (VYTORIN) 10-40 MG per tablet Take 1 tablet by mouth daily.  . folic acid (FOLVITE) 765 MCG tablet Take 400 mcg by mouth daily.    . isosorbide mononitrate (IMDUR) 60 MG 24 hr tablet TAKE ONE TABLET BY MOUTH DAILY.  [name] losartan (COZAAR) 50 MG tablet TAKE ONE TABLET BY MOUTH ONCE DAILY  . nebivolol (BYSTOLIC) 10 MG tablet Take 5 mg by mouth daily.   . nitroGLYCERIN (NITROSTAT) 0.4 MG SL  tablet Place 1 tablet (0.4 mg total) under the tongue every 5 (five) minutes as needed for chest pain.  [name] oxyCODONE-acetaminophen (PERCOCET) 10-325 MG per tablet Take 1 tablet by mouth 2 (two) times daily as needed.  [name] rOPINIRole (REQUIP) 2 MG tablet TAKE ONE TABLET BY MOUTH AT BEDTIME  . tamsulosin (FLOMAX) 0.4 MG CAPS capsule Use as directed  . venlafaxine (EFFEXOR) 75 MG tablet TAKE ONE TABLET BY MOUTH DAILY.  . [DISCONTINUED] VYTORIN 10-40 MG per tablet TAKE ONE TABLET BY MOUTH ONCE DAILY AT BEDTIME     REVIEW OF SYSTEMS  : All other systems reviewed and negative except where noted in the History of Present Illness.   PHYSICAL EXAM: BP 124/62  Pulse 68  Ht 5\' 9"  (1.753 m)  Wt 189 lb (85.73 kg)  BMI 27.90  kg/m2 General: Well developed white male in no acute distress Head: Normocephalic and atraumatic Eyes:  Sclerae anicteric, conjunctiva pink. Ears: Normal auditory acuity.  Lungs: Clear throughout to auscultation Heart: Regular rate and rhythm Abdomen: Soft, non-distended.  Normal bowel sounds.  Non-tender. Musculoskeletal: Symmetrical with no gross deformities  Skin: No lesions on visible extremities Extremities: No edema  Neurological: Alert oriented x 4, grossly non-focal Psychological:  Alert and cooperative. Normal mood and affect  ASSESSMENT AND PLAN: -Solid food dysphagia with history of stricture s/p dilation on EGD in 04/2010:  Will schedule for EGD with repeat dilation with Dr. Deatra Ina.  The risks, benefits, and alternatives were discussed with the patient and he consents to proceed.  -GERD:  ? History of Barrett's, mentioned on endoscopies, but last two pathologies did not show any metaplasia.  Overall symptoms well-controlled on Nexium 40 mg BID.

## 2013-07-17 ENCOUNTER — Encounter: Payer: Self-pay | Admitting: Internal Medicine

## 2013-07-17 ENCOUNTER — Ambulatory Visit (INDEPENDENT_AMBULATORY_CARE_PROVIDER_SITE_OTHER): Payer: Medicare Other | Admitting: Internal Medicine

## 2013-07-17 VITALS — BP 150/80 | HR 62 | Temp 98.4°F | Resp 20 | Ht 69.0 in | Wt 191.0 lb

## 2013-07-17 DIAGNOSIS — M545 Low back pain, unspecified: Secondary | ICD-10-CM

## 2013-07-17 DIAGNOSIS — M79605 Pain in left leg: Secondary | ICD-10-CM

## 2013-07-17 DIAGNOSIS — H8113 Benign paroxysmal vertigo, bilateral: Secondary | ICD-10-CM

## 2013-07-17 DIAGNOSIS — G5620 Lesion of ulnar nerve, unspecified upper limb: Secondary | ICD-10-CM

## 2013-07-17 DIAGNOSIS — H8110 Benign paroxysmal vertigo, unspecified ear: Secondary | ICD-10-CM

## 2013-07-17 DIAGNOSIS — M79609 Pain in unspecified limb: Secondary | ICD-10-CM

## 2013-07-17 DIAGNOSIS — M79604 Pain in right leg: Secondary | ICD-10-CM

## 2013-07-17 DIAGNOSIS — G608 Other hereditary and idiopathic neuropathies: Secondary | ICD-10-CM

## 2013-07-17 DIAGNOSIS — R350 Frequency of micturition: Secondary | ICD-10-CM

## 2013-07-17 DIAGNOSIS — G5622 Lesion of ulnar nerve, left upper limb: Secondary | ICD-10-CM

## 2013-07-17 DIAGNOSIS — I10 Essential (primary) hypertension: Secondary | ICD-10-CM

## 2013-07-17 LAB — POCT URINALYSIS DIPSTICK
Bilirubin, UA: 3
Blood, UA: NEGATIVE
Glucose, UA: NEGATIVE
Ketones, UA: NEGATIVE
LEUKOCYTES UA: NEGATIVE
Nitrite, UA: NEGATIVE
PROTEIN UA: NEGATIVE
Spec Grav, UA: 1.025
Urobilinogen, UA: 0.2
pH, UA: 5

## 2013-07-17 MED ORDER — MECLIZINE HCL 25 MG PO TABS: 25.0000 mg | ORAL_TABLET | Freq: Three times a day (TID) | ORAL | Status: DC | PRN

## 2013-07-17 NOTE — Patient Instructions (Addendum)
Rheumatology consultation as scheduledBenign Positional Vertigo Vertigo means you feel like you or your surroundings are moving when they are not. Benign positional vertigo is the most common form of vertigo. Benign means that the cause of your condition is not serious. Benign positional vertigo is more common in older adults. CAUSES  Benign positional vertigo is the result of an upset in the labyrinth system. This is an area in the middle ear that helps control your balance. This may be caused by a viral infection, head injury, or repetitive motion. However, often no specific cause is found. SYMPTOMS  Symptoms of benign positional vertigo occur when you move your head or eyes in different directions. Some of the symptoms may include:  Loss of balance and falls.  Vomiting.  Blurred vision.  Dizziness.  Nausea.  Involuntary eye movements (nystagmus). DIAGNOSIS  Benign positional vertigo is usually diagnosed by physical exam. If the specific cause of your benign positional vertigo is unknown, your caregiver may perform imaging tests, such as magnetic resonance imaging (MRI) or computed tomography (CT). TREATMENT  Your caregiver may recommend movements or procedures to correct the benign positional vertigo. Medicines such as meclizine, benzodiazepines, and medicines for nausea may be used to treat your symptoms. In rare cases, if your symptoms are caused by certain conditions that affect the inner ear, you may need surgery. HOME CARE INSTRUCTIONS   Follow your caregiver's instructions.  Move slowly. Do not make sudden body or head movements.  Avoid driving.  Avoid operating heavy machinery.  Avoid performing any tasks that would be dangerous to you or others during a vertigo episode.  Drink enough fluids to keep your urine clear or pale yellow. SEEK IMMEDIATE MEDICAL CARE IF:   You develop problems with walking, weakness, numbness, or using your arms, hands, or legs.  You have  difficulty speaking.  You develop severe headaches.  Your nausea or vomiting continues or gets worse.  You develop visual changes.  Your family or friends notice any behavioral changes.  Your condition gets worse.  You have a fever.  You develop a stiff neck or sensitivity to light. MAKE SURE YOU:   Understand these instructions.  Will watch your condition.  Will get help right away if you are not doing well or get worse. Document Released: 10/13/2005 Document Revised: 03/30/2011 Document Reviewed: 09/25/2010 [facility] Patient Information 2015 [place], [place]. This information is not intended to replace advice given to you by your health care Rickie Gutierres. Make sure you discuss any questions you have with your health care Aamya Orellana.

## 2013-07-17 NOTE — Progress Notes (Signed)
Pre-visit discussion using our clinic review tool. No additional management support is needed unless otherwise documented below in the visit note.  

## 2013-07-17 NOTE — Progress Notes (Signed)
Subjective:    Patient ID: [name], male    DOB: [birth year], 73 y.o.   MRN: [id number]  HPI  73 year old patient, who presents today with some multiple concerns.  His chief complaint is dizziness.  He states that he is having vertigo-type symptoms for at least 5 years with 2 significant flares per year.  For the past several days.  He has had significant positional vertigo, associated with nausea.]  He also complains of urinary frequency.  He does have a history recurrent bladder cancer and has been prescribed Flomax by urology.  This has not been helpful.  Urinalysis reviewed today and was normal He complains of left arm low back and neck pain.  He does have a history of chronic low back pain, idiopathic neuropathy, as well as a left ulnar neuropathy. He has coronary artery disease, which has been stable.  He has an appointment pending to see rheumatology.  He is treated with Percocet when necessary.  Past Medical History  Diagnosis Date  . Esophagitis   . Colon polyps   . History of bladder cancer   . Esophagus, Barrett's   . Stenosis of esophagus   . Localized osteoarthrosis, lower leg   . Malaise and fatigue   . Hypothyroidism   . Hypertension   . Anemia   . B12 deficiency anemia   . GERD (gastroesophageal reflux disease)   . Hyperlipidemia   . Depression   . Allergy   . CAD (coronary artery disease)   . COPD (chronic obstructive pulmonary disease)   . Low back pain     History   Social History  . Marital Status: Married    Spouse Name: N/A    Number of Children: N/A  . Years of Education: N/A   Occupational History  . Not on file.   Social History Main Topics  . Smoking status: Former Smoker    Quit date: 03/30/1973  . Smokeless tobacco: Never Used     Comment: quit in 1977  . Alcohol Use: No  . Drug Use: No  . Sexual Activity: Yes   Other Topics Concern  . Not on file   Social History Narrative  . No narrative on file    Past Surgical  History  Procedure Laterality Date  . Coronary artery bypass graft      x4  . Cervical discectomy      ACDF  . Lumbar laminectomy      and fusion  . Bladder cancer    . Popliteal synovial cyst excision    . Knee arthroscopy    . Esophagogastroduodenoscopy  04/29/2010  . Colonoscopy  11/17/2005    normal     Family History  Problem Relation Age of Onset  . Coronary artery disease    . Cancer Mother     melanoma  . Stroke Father   . Hypertension Father   . Colon cancer Neg Hx   . Esophageal cancer Neg Hx     No Known Allergies  Current Outpatient Prescriptions on File Prior to Visit  Medication Sig Dispense Refill  . aspirin 81 MG tablet Take 81 mg by mouth daily.        [name] esomeprazole (NEXIUM) 40 MG capsule Take 1 capsule (40 mg total) by mouth 2 (two) times daily.  30 capsule  11  . etodolac (LODINE) 300 MG capsule TAKE ONE CAPSULE BY MOUTH EVERY 8 HOURS  60 capsule  5  . ezetimibe-simvastatin (VYTORIN) 10-40 MG  per tablet Take 1 tablet by mouth daily.  90 tablet  3  . folic acid (FOLVITE) 891 MCG tablet Take 400 mcg by mouth daily.        . isosorbide mononitrate (IMDUR) 60 MG 24 hr tablet TAKE ONE TABLET BY MOUTH DAILY.  180 tablet  3  . losartan (COZAAR) 50 MG tablet TAKE ONE TABLET BY MOUTH ONCE DAILY  30 tablet  6  . nebivolol (BYSTOLIC) 10 MG tablet Take 5 mg by mouth daily.       . nitroGLYCERIN (NITROSTAT) 0.4 MG SL tablet Place 1 tablet (0.4 mg total) under the tongue every 5 (five) minutes as needed for chest pain.  30 tablet  3  . oxyCODONE-acetaminophen (PERCOCET) 10-325 MG per tablet Take 1 tablet by mouth 2 (two) times daily as needed.  60 tablet  0  . rOPINIRole (REQUIP) 2 MG tablet TAKE ONE TABLET BY MOUTH AT BEDTIME  90 tablet  0  . tamsulosin (FLOMAX) 0.4 MG CAPS capsule Use as directed      . venlafaxine (EFFEXOR) 75 MG tablet TAKE ONE TABLET BY MOUTH DAILY.  90 tablet  3   No current facility-administered medications on file prior to visit.    BP  150/80  Pulse 62  Temp(Src) 98.4 F (36.9 C) (Oral)  Resp 20  Ht 5\' 9"  (1.753 m)  Wt 191 lb (86.637 kg)  BMI 28.19 kg/m2  SpO2 98%       Review of Systems  Constitutional: Negative for fever, chills, appetite change and fatigue.  HENT: Negative for congestion, dental problem, ear pain, hearing loss, sore throat, tinnitus, trouble swallowing and voice change.   Eyes: Negative for pain, discharge and visual disturbance.  Respiratory: Negative for cough, chest tightness, wheezing and stridor.   Cardiovascular: Negative for chest pain, palpitations and leg swelling.  Gastrointestinal: Negative for nausea, vomiting, abdominal pain, diarrhea, constipation, blood in stool and abdominal distention.  Genitourinary: Positive for urgency and frequency. Negative for hematuria, flank pain, discharge, difficulty urinating and genital sores.  Musculoskeletal: Positive for arthralgias, back pain and neck pain. Negative for gait problem, joint swelling, myalgias and neck stiffness.  Skin: Negative for rash.  Neurological: Positive for dizziness. Negative for syncope, speech difficulty, weakness, numbness and headaches.  Hematological: Negative for adenopathy. Does not bruise/bleed easily.  Psychiatric/Behavioral: Negative for behavioral problems and dysphoric mood. The patient is not nervous/anxious.        Objective:   Physical Exam  Constitutional: He is oriented to person, place, and time. He appears well-developed.  HENT:  Head: Normocephalic.  Right Ear: External ear normal.  Left Ear: External ear normal.  Eyes: Conjunctivae and EOM are normal.  Neck: Normal range of motion.  Cardiovascular: Normal rate and normal heart sounds.   Pulmonary/Chest: Breath sounds normal.  Abdominal: Bowel sounds are normal.  Musculoskeletal: Normal range of motion. He exhibits no edema and no tenderness.  Normal gait Normal finger to nose testing Cranial nerve examination normal  Neurological: He is  alert and oriented to person, place, and time.  Psychiatric: He has a normal mood and affect. His behavior is normal.          Assessment & Plan:   Benign positional vertigo.  We'll give information and instructions concerning repositioning maneuvers.  We'll treat with meclizine Coronary artery disease, stable Chronic low back and neck pain.  Patient referred to rheumatology.  Patient is on Percocet Urinary frequency.  Followup urology.  History of bladder cancer status  post cystoscopy 4 months ago

## 2013-07-20 ENCOUNTER — Encounter: Payer: Self-pay | Admitting: Gastroenterology

## 2013-07-25 ENCOUNTER — Ambulatory Visit (INDEPENDENT_AMBULATORY_CARE_PROVIDER_SITE_OTHER): Payer: Medicare Other | Admitting: *Deleted

## 2013-07-25 DIAGNOSIS — D518 Other vitamin B12 deficiency anemias: Secondary | ICD-10-CM

## 2013-07-25 MED ORDER — CYANOCOBALAMIN 1000 MCG/ML IJ SOLN
1000.0000 ug | Freq: Once | INTRAMUSCULAR | Status: AC
  Administered 2013-07-25: 1000 ug via INTRAMUSCULAR

## 2013-07-28 ENCOUNTER — Encounter: Payer: Self-pay | Admitting: Gastroenterology

## 2013-07-28 ENCOUNTER — Ambulatory Visit (AMBULATORY_SURGERY_CENTER): Payer: Medicare Other | Admitting: Gastroenterology

## 2013-07-28 VITALS — BP 119/67 | HR 58 | Temp 96.5°F | Resp 22 | Ht 69.0 in | Wt 189.0 lb

## 2013-07-28 DIAGNOSIS — R1319 Other dysphagia: Secondary | ICD-10-CM

## 2013-07-28 DIAGNOSIS — K2270 Barrett's esophagus without dysplasia: Secondary | ICD-10-CM

## 2013-07-28 DIAGNOSIS — K222 Esophageal obstruction: Secondary | ICD-10-CM

## 2013-07-28 MED ORDER — SODIUM CHLORIDE 0.9 % IV SOLN: 500.0000 mL | INTRAVENOUS | Status: DC

## 2013-07-28 NOTE — Op Note (Signed)
[place]  [address]. [place], [id number]   ENDOSCOPY PROCEDURE REPORT  PATIENT: [name], [name]  MR#: [id number] BIRTHDATE: March 10, 1940 , 73  yrs. old GENDER: Male ENDOSCOPIST: Inda Castle, MD REFERRED BY:  Bluford Kaufmann, M.D. PROCEDURE DATE:  07/28/2013 PROCEDURE:  EGD w/ biopsy and Maloney dilation of esophagus ASA CLASS:     Class II INDICATIONS:  Dysphagia.   history of Barrett's esophagus. MEDICATIONS: MAC sedation, administered by CRNA, Fentanyl-Detailed 180 mg IV, and Simethicone 0.6cc PO TOPICAL ANESTHETIC:  DESCRIPTION OF PROCEDURE: After the risks benefits and alternatives of the procedure were thoroughly explained, informed consent was obtained.  The LB [id number] endoscope was introduced through the mouth and advanced to the third portion of the duodenum. Without limitations.  The instrument was slowly withdrawn as the mucosa was fully examined.      There was moderate stenosis or stricture at the GE junction.  The 9 mm gastroscope easily traversed the area.  The GE junction was very irregular.  Biopsies were taken to evaluate for Barrett's esophagus.   There was moderate stenosis or stricture at the GE junction.  The 9 mm gastroscope easily traversed the area.  The GE junction was very irregular.  Biopsies were taken to evaluate for Barrett's esophagus.   The remainder of the upper endoscopy exam was otherwise normal.  Retroflexed views revealed no abnormalities. The scope was then withdrawn from the patient and the procedure completed.  A #52 Isabell Jarvis dilator was passed with mild resistance.  There was no heme.  COMPLICATIONS: There were no complications. ENDOSCOPIC IMPRESSION: 1.   esophageal stricture-status post Maloney dilation 2.  Barrett's esophagus  RECOMMENDATIONS: Await biopsy results barium swallow if dysphagia persists  REPEAT EXAM:  eSigned:  Inda Castle, MD 07/28/2013 2:46  PM   CC:

## 2013-07-28 NOTE — Progress Notes (Signed)
Procedure ends, to recovery, report given and VSS. 

## 2013-07-28 NOTE — Progress Notes (Signed)
No complaints noted in the recovery room. Maw   

## 2013-07-28 NOTE — Patient Instructions (Signed)
YOU HAD AN ENDOSCOPIC PROCEDURE TODAY AT [facility]: Refer to the procedure report that was given to you for any specific questions about what was found during the examination.  If the procedure report does not answer your questions, please call your gastroenterologist to clarify.  If you requested that your care partner not be given the details of your procedure findings, then the procedure report has been included in a sealed envelope for you to review at your convenience later.  YOU SHOULD EXPECT: Some feelings of bloating in the abdomen. Passage of more gas than usual.  Walking can help get rid of the air that was put into your GI tract during the procedure and reduce the bloating. If you had a lower endoscopy (such as a colonoscopy or flexible sigmoidoscopy) you may notice spotting of blood in your stool or on the toilet paper. If you underwent a bowel prep for your procedure, then you may not have a normal bowel movement for a few days.  DIET:  Drink plenty of fluids but you should avoid alcoholic beverages for 24 hours.  Please follow the dilatation diet the rest of the day.  ACTIVITY: Your care partner should take you home directly after the procedure.  You should plan to take it easy, moving slowly for the rest of the day.  You can resume normal activity the day after the procedure however you should NOT DRIVE or use heavy machinery for 24 hours (because of the sedation medicines used during the test).    SYMPTOMS TO REPORT IMMEDIATELY: A gastroenterologist can be reached at any hour.  During normal business hours, 8:30 AM to 5:00 PM Monday through Friday, call [phone].  After hours and on weekends, please call the GI answering service at [phone] who will take a message and have the physician on call contact you.     Following upper endoscopy (EGD)  Vomiting of blood or coffee ground material  New chest pain or pain under the shoulder blades  Painful or  persistently difficult swallowing  New shortness of breath  Fever of 100F or higher  Black, tarry-looking stools  FOLLOW UP: If any biopsies were taken you will be contacted by phone or by letter within the next 1-3 weeks.  Call your gastroenterologist if you have not heard about the biopsies in 3 weeks.  Our staff will call the home number listed on your records the next business day following your procedure to check on you and address any questions or concerns that you may have at that time regarding the information given to you following your procedure. This is a courtesy call and so if there is no answer at the home number and we have not heard from you through the emergency physician on call, we will assume that you have returned to your regular daily activities without incident.  SIGNATURES/CONFIDENTIALITY: You and/or your care partner have signed paperwork which will be entered into your electronic medical record.  These signatures attest to the fact that that the information above on your After Visit Summary has been reviewed and is understood.  Full responsibility of the confidentiality of this discharge information lies with you and/or your care-partner.    Handouts were given to your care partner on a dilatation diet and barrett's esophagus. You may resume your current medications today. Please call if dysphagia continues...barium swallow.

## 2013-07-28 NOTE — Progress Notes (Signed)
Called to room to assist during endoscopic procedure.  Patient ID and intended procedure confirmed with present staff. Received instructions for my participation in the procedure from the performing physician.  

## 2013-07-31 ENCOUNTER — Telehealth: Payer: Self-pay | Admitting: *Deleted

## 2013-07-31 NOTE — Telephone Encounter (Signed)
  Follow up Call-  Call back number 07/28/2013  Post procedure Call Back phone  # [phone]  Permission to leave phone message Yes     Patient questions:  Do you have a fever, pain , or abdominal swelling? No. Pain Score  0 *  Have you tolerated food without any problems? Yes.    Have you been able to return to your normal activities? Yes.    Do you have any questions about your discharge instructions: Diet   No. Medications  No. Follow up visit  No.  Do you have questions or concerns about your Care? No.  Actions: * If pain score is 4 or above: No action needed, pain <4.

## 2013-08-15 ENCOUNTER — Encounter: Payer: Self-pay | Admitting: Gastroenterology

## 2013-08-25 ENCOUNTER — Ambulatory Visit (INDEPENDENT_AMBULATORY_CARE_PROVIDER_SITE_OTHER): Payer: Medicare Other | Admitting: Internal Medicine

## 2013-08-25 ENCOUNTER — Encounter: Payer: Self-pay | Admitting: *Deleted

## 2013-08-25 ENCOUNTER — Encounter: Payer: Self-pay | Admitting: Internal Medicine

## 2013-08-25 VITALS — BP 122/70 | HR 63 | Temp 98.3°F | Resp 18 | Ht 69.0 in | Wt 188.0 lb

## 2013-08-25 DIAGNOSIS — M545 Low back pain, unspecified: Secondary | ICD-10-CM

## 2013-08-25 DIAGNOSIS — E039 Hypothyroidism, unspecified: Secondary | ICD-10-CM

## 2013-08-25 DIAGNOSIS — M2550 Pain in unspecified joint: Secondary | ICD-10-CM

## 2013-08-25 DIAGNOSIS — I2510 Atherosclerotic heart disease of native coronary artery without angina pectoris: Secondary | ICD-10-CM

## 2013-08-25 DIAGNOSIS — I10 Essential (primary) hypertension: Secondary | ICD-10-CM

## 2013-08-25 DIAGNOSIS — D518 Other vitamin B12 deficiency anemias: Secondary | ICD-10-CM

## 2013-08-25 DIAGNOSIS — K222 Esophageal obstruction: Secondary | ICD-10-CM

## 2013-08-25 DIAGNOSIS — I2584 Coronary atherosclerosis due to calcified coronary lesion: Secondary | ICD-10-CM

## 2013-08-25 MED ORDER — OXYCODONE-ACETAMINOPHEN 10-325 MG PO TABS: 1.0000 | ORAL_TABLET | Freq: Two times a day (BID) | ORAL | Status: DC | PRN

## 2013-08-25 MED ORDER — CYANOCOBALAMIN 1000 MCG/ML IJ SOLN
1000.0000 ug | Freq: Once | INTRAMUSCULAR | Status: AC
  Administered 2013-08-25: 1000 ug via INTRAMUSCULAR

## 2013-08-25 NOTE — Progress Notes (Signed)
Subjective:    Patient ID: [name], male    DOB: 02-27-1940, 73 y.o.   MRN: [id number]  HPI  73 year old patient who is seen today for followup.  He has a history of osteoarthritis and has had a recent rheumatology evaluation in [place].  He is on oxycodone for pain relief.  He has a history of hypothyroidism, hypertension, and coronary artery disease.  He has a history of esophageal stricture that has required dilatation in the past.  In general, he seems fairly stable except for his arthritic complaints.  He is on oxycodone 10 taking 1 half tablet 3 times daily.  He has COPD, which has been stable. Due to his chronic rheumatologic complaints and history of multiple tick exposures (greater than 20.  tics removed.  This year), he is requesting testing for Lyme's disease  Past Medical History  Diagnosis Date  . Esophagitis   . Colon polyps   . History of bladder cancer   . Esophagus, Barrett's   . Stenosis of esophagus   . Localized osteoarthrosis, lower leg   . Malaise and fatigue   . Hypothyroidism   . Hypertension   . Anemia   . B12 deficiency anemia   . GERD (gastroesophageal reflux disease)   . Hyperlipidemia   . Depression   . Allergy   . CAD (coronary artery disease)   . COPD (chronic obstructive pulmonary disease)   . Low back pain   . Blood transfusion without reported diagnosis   . Cancer     bladder cancer 8 times    History   Social History  . Marital Status: Married    Spouse Name: N/A    Number of Children: N/A  . Years of Education: N/A   Occupational History  . Not on file.   Social History Main Topics  . Smoking status: Former Smoker    Quit date: 03/30/1973  . Smokeless tobacco: Never Used     Comment: quit in 1977  . Alcohol Use: No  . Drug Use: No  . Sexual Activity: Yes   Other Topics Concern  . Not on file   Social History Narrative  . No narrative on file    Past Surgical History  Procedure Laterality Date  .  Coronary artery bypass graft      x4  . Cervical discectomy      ACDF  . Lumbar laminectomy      and fusion  . Bladder cancer    . Popliteal synovial cyst excision    . Knee arthroscopy    . Esophagogastroduodenoscopy  04/29/2010  . Colonoscopy  11/17/2005    normal     Family History  Problem Relation Age of Onset  . Coronary artery disease    . Cancer Mother     melanoma  . Stroke Father   . Hypertension Father   . Colon cancer Neg Hx   . Esophageal cancer Neg Hx   . Stomach cancer Neg Hx   . Rectal cancer Neg Hx     No Known Allergies  Current Outpatient Prescriptions on File Prior to Visit  Medication Sig Dispense Refill  . aspirin 81 MG tablet Take 81 mg by mouth daily.        [name] esomeprazole (NEXIUM) 40 MG capsule Take 1 capsule (40 mg total) by mouth 2 (two) times daily.  30 capsule  11  . etodolac (LODINE) 300 MG capsule TAKE ONE CAPSULE BY MOUTH EVERY 8 HOURS  60 capsule  5  . ezetimibe-simvastatin (VYTORIN) 10-40 MG per tablet Take 1 tablet by mouth daily.  90 tablet  3  . folic acid (FOLVITE) 294 MCG tablet Take 400 mcg by mouth daily.        . isosorbide mononitrate (IMDUR) 60 MG 24 hr tablet TAKE ONE TABLET BY MOUTH DAILY.  180 tablet  3  . losartan (COZAAR) 50 MG tablet TAKE ONE TABLET BY MOUTH ONCE DAILY  30 tablet  6  . meclizine (ANTIVERT) 25 MG tablet Take 1 tablet (25 mg total) by mouth 3 (three) times daily as needed for dizziness.  30 tablet  0  . nebivolol (BYSTOLIC) 10 MG tablet Take 5 mg by mouth daily.       . nitroGLYCERIN (NITROSTAT) 0.4 MG SL tablet Place 1 tablet (0.4 mg total) under the tongue every 5 (five) minutes as needed for chest pain.  30 tablet  3  . oxyCODONE-acetaminophen (PERCOCET) 10-325 MG per tablet Take 1 tablet by mouth 2 (two) times daily as needed.  60 tablet  0  . rOPINIRole (REQUIP) 2 MG tablet TAKE ONE TABLET BY MOUTH AT BEDTIME  90 tablet  0  . tamsulosin (FLOMAX) 0.4 MG CAPS capsule Use as directed      . venlafaxine  (EFFEXOR) 75 MG tablet TAKE ONE TABLET BY MOUTH DAILY.  90 tablet  3   No current facility-administered medications on file prior to visit.    BP 122/70  Pulse 63  Temp(Src) 98.3 F (36.8 C) (Oral)  Resp 18  Ht 5\' 9"  (1.753 m)  Wt 188 lb (85.276 kg)  BMI 27.75 kg/m2  SpO2 98%     Review of Systems  Constitutional: Negative for fever, chills, appetite change and fatigue.  HENT: Negative for congestion, dental problem, ear pain, hearing loss, sore throat, tinnitus, trouble swallowing and voice change.   Eyes: Negative for pain, discharge and visual disturbance.  Respiratory: Negative for cough, chest tightness, wheezing and stridor.   Cardiovascular: Negative for chest pain, palpitations and leg swelling.  Gastrointestinal: Negative for nausea, vomiting, abdominal pain, diarrhea, constipation, blood in stool and abdominal distention.  Genitourinary: Negative for urgency, hematuria, flank pain, discharge, difficulty urinating and genital sores.  Musculoskeletal: Positive for arthralgias, back pain and myalgias. Negative for gait problem, joint swelling and neck stiffness.  Skin: Negative for rash.  Neurological: Negative for dizziness, syncope, speech difficulty, weakness, numbness and headaches.  Hematological: Negative for adenopathy. Does not bruise/bleed easily.  Psychiatric/Behavioral: Negative for behavioral problems and dysphoric mood. The patient is not nervous/anxious.        Objective:   Physical Exam  Constitutional: He is oriented to person, place, and time. He appears well-developed.  HENT:  Head: Normocephalic.  Right Ear: External ear normal.  Left Ear: External ear normal.  Eyes: Conjunctivae and EOM are normal.  Neck: Normal range of motion.  Cardiovascular: Normal rate and normal heart sounds.   Pulmonary/Chest: Breath sounds normal.  Abdominal: Bowel sounds are normal.  Musculoskeletal: Normal range of motion. He exhibits no edema and no tenderness.    Neurological: He is alert and oriented to person, place, and time.  Psychiatric: He has a normal mood and affect. His behavior is normal.          Assessment & Plan:   Osteoarthritis.  Oxycodone refilled.  Urine drug screen obtained.  We'll screen for Lyme disease with serological testing Hypertension well controlled.  Repeat blood pressure 110/60 Coronary artery disease, stable Dyslipidemia.  We'll discuss statin therapy

## 2013-08-25 NOTE — Progress Notes (Signed)
Pre visit review using our clinic review tool, if applicable. No additional management support is needed unless otherwise documented below in the visit note. 

## 2013-08-25 NOTE — Patient Instructions (Signed)
Limit your sodium (Salt) intake    It is important that you exercise regularly, at least 20 minutes 3 to 4 times per week.  If you develop chest pain or shortness of breath seek  medical attention.  Return in 6 months for follow-up  

## 2013-08-28 LAB — B. BURGDORFI ANTIBODIES: B burgdorferi Ab IgG+IgM: 0.37 {ISR}

## 2013-09-07 ENCOUNTER — Other Ambulatory Visit: Payer: Self-pay | Admitting: Internal Medicine

## 2013-10-03 ENCOUNTER — Ambulatory Visit (INDEPENDENT_AMBULATORY_CARE_PROVIDER_SITE_OTHER): Payer: Medicare Other | Admitting: *Deleted

## 2013-10-03 DIAGNOSIS — D518 Other vitamin B12 deficiency anemias: Secondary | ICD-10-CM

## 2013-10-03 MED ORDER — CYANOCOBALAMIN 1000 MCG/ML IJ SOLN
1000.0000 ug | Freq: Once | INTRAMUSCULAR | Status: AC
  Administered 2013-10-03: 1000 ug via INTRAMUSCULAR

## 2013-10-10 ENCOUNTER — Other Ambulatory Visit: Payer: Self-pay | Admitting: Cardiovascular Disease

## 2013-10-10 ENCOUNTER — Other Ambulatory Visit: Payer: Self-pay | Admitting: Internal Medicine

## 2013-10-10 ENCOUNTER — Telehealth: Payer: Self-pay | Admitting: Internal Medicine

## 2013-10-10 MED ORDER — NEBIVOLOL HCL 10 MG PO TABS: 5.0000 mg | ORAL_TABLET | Freq: Every day | ORAL | Status: DC

## 2013-10-10 MED ORDER — LOSARTAN POTASSIUM 50 MG PO TABS: ORAL_TABLET | ORAL | Status: DC

## 2013-10-10 MED ORDER — ETODOLAC 300 MG PO CAPS: ORAL_CAPSULE | ORAL | Status: DC

## 2013-10-10 NOTE — Telephone Encounter (Signed)
Rx was sent to pharmacy electronically. 

## 2013-10-10 NOTE — Telephone Encounter (Signed)
Pt needs new rx nebivolol (BYSTOLIC) 10 MG tablet  90 day sent [facility] Also needs etodolac (LODINE) 300 MG capsule   Changed to 90 day losartan (COZAAR) 50 MG tablet  90 day  Was sent for 30 day , (pls resend)  ezetimibe-simvastatin (VYTORIN) 10-40 MG per tablet 30 day

## 2013-10-10 NOTE — Telephone Encounter (Signed)
Left detailed message Rx's sent to pharmacy as requested. 

## 2013-10-19 ENCOUNTER — Telehealth: Payer: Self-pay | Admitting: Internal Medicine

## 2013-10-19 MED ORDER — OXYCODONE-ACETAMINOPHEN 10-325 MG PO TABS: 1.0000 | ORAL_TABLET | Freq: Two times a day (BID) | ORAL | Status: DC | PRN

## 2013-10-19 NOTE — Telephone Encounter (Signed)
Pt needs new rx oxycodone °

## 2013-10-19 NOTE — Telephone Encounter (Signed)
Ok 30 day

## 2013-10-19 NOTE — Telephone Encounter (Signed)
Please advise if refill okay for Oxycodone?

## 2013-10-19 NOTE — Telephone Encounter (Signed)
rx up front for p/u, pt aware

## 2013-11-01 ENCOUNTER — Ambulatory Visit (INDEPENDENT_AMBULATORY_CARE_PROVIDER_SITE_OTHER): Payer: Medicare Other | Admitting: *Deleted

## 2013-11-01 DIAGNOSIS — D518 Other vitamin B12 deficiency anemias: Secondary | ICD-10-CM

## 2013-11-01 MED ORDER — CYANOCOBALAMIN 1000 MCG/ML IJ SOLN
1000.0000 ug | Freq: Once | INTRAMUSCULAR | Status: AC
  Administered 2013-11-01: 1000 ug via INTRAMUSCULAR

## 2013-11-06 ENCOUNTER — Other Ambulatory Visit: Payer: Self-pay | Admitting: Internal Medicine

## 2013-11-06 ENCOUNTER — Telehealth: Payer: Self-pay | Admitting: Internal Medicine

## 2013-11-06 DIAGNOSIS — H8113 Benign paroxysmal vertigo, bilateral: Secondary | ICD-10-CM

## 2013-11-06 MED ORDER — MECLIZINE HCL 25 MG PO TABS: 25.0000 mg | ORAL_TABLET | Freq: Three times a day (TID) | ORAL | Status: DC | PRN

## 2013-11-06 NOTE — Telephone Encounter (Signed)
Left message on voicemail to call office.  

## 2013-11-06 NOTE — Telephone Encounter (Signed)
Spoke to pt, told him Rx was sent to pharmacy for Meclizine but if your symptoms do not improve please make an appointment. Pt verbalized understanding.

## 2013-11-06 NOTE — Telephone Encounter (Signed)
Pt is having same symptoms as he had this past June. Pt would like a refill of the meclizine (ANTIVERT) 25 MG tablet Pt states he feels like he has air in his head,pressure and sort of stopped up. Gets this same thing a couple of times/yr. [facility]

## 2013-11-20 ENCOUNTER — Encounter: Payer: Self-pay | Admitting: Cardiovascular Disease

## 2013-11-20 ENCOUNTER — Ambulatory Visit (INDEPENDENT_AMBULATORY_CARE_PROVIDER_SITE_OTHER): Payer: Medicare Other | Admitting: Cardiovascular Disease

## 2013-11-20 VITALS — BP 134/68 | HR 56 | Ht 69.0 in | Wt 188.6 lb

## 2013-11-20 DIAGNOSIS — I2510 Atherosclerotic heart disease of native coronary artery without angina pectoris: Secondary | ICD-10-CM

## 2013-11-20 DIAGNOSIS — I255 Ischemic cardiomyopathy: Secondary | ICD-10-CM

## 2013-11-20 DIAGNOSIS — G2581 Restless legs syndrome: Secondary | ICD-10-CM

## 2013-11-20 DIAGNOSIS — R0602 Shortness of breath: Secondary | ICD-10-CM

## 2013-11-20 DIAGNOSIS — E785 Hyperlipidemia, unspecified: Secondary | ICD-10-CM

## 2013-11-20 NOTE — Progress Notes (Signed)
Patient ID: [name], male   DOB: [birth year], 73 y.o.   MRN: [id number]      HPI: [name] is a 73 y.o. male who presents to the office today for a 6 month cardiology followup evaluation.  [name] has known CAD and underwent CABG revascularization surgery LIMA to his LAD, vein to the marginal, vein to the RCA. Catheterization in 2004 showed patent grafts. An echo Doppler study in August 2012 revealed an ejection fraction of 40-45% with moderate septal hypokinesis with grade 1 diastolic dysfunction as well as aortic valve sclerosis. Additional problems include GERD, mild emphysema, as well as obstructive sleep apnea. He no longer utilizes CPAP. He has lost approximately 25 pounds since his wife's death from a motor vehicle accident in January 2013.  He remains active and notes mild shortness of breath with activity particularly when walking up hills but this has not changed.  He also is a history of restless leg syndrome and has benefited with requip. His last blood work was done  in December 2013. At that time, LDL was 57 with LDL particle #1035, small LDL particle number was 611, slightly increased, HDLC was 35 triglycerides 93 total cholesterol 111 initial resistance course slightly elevated at 53.  BP blood work in March 2015 revealed normal renal function.  Fasting glucose was minimally increased at 106.  Total cholesterol was 116 triglycerides 80, and LDL 66, with an HDL of 34.  LDL particle number was slightly increased at 1190.  Over the past 6 months [name] has felt well. He denies recurrent anginal symptoms.  He denies any palpitations.  There is no presyncope or syncope.  His last myocardial perfusion study was done in August 2012. 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  Past Medical History  Diagnosis Date  . Esophagitis   . Colon polyps   . History of bladder cancer   . Esophagus, Barrett's     . Stenosis of esophagus   . Localized osteoarthrosis, lower leg   . Malaise and fatigue   . Hypothyroidism   . Hypertension   . Anemia   . B12 deficiency anemia   . GERD (gastroesophageal reflux disease)   . Hyperlipidemia   . Depression   . Allergy   . CAD (coronary artery disease)   . COPD (chronic obstructive pulmonary disease)   . Low back pain   . Blood transfusion without reported diagnosis   . Cancer     bladder cancer 8 times    Past Surgical History  Procedure Laterality Date  . Coronary artery bypass graft      x4  . Cervical discectomy      ACDF  . Lumbar laminectomy      and fusion  . Bladder cancer    . Popliteal synovial cyst excision    . Knee arthroscopy    . Esophagogastroduodenoscopy  04/29/2010  . Colonoscopy  11/17/2005    normal     No Known Allergies  Current Outpatient Prescriptions  Medication Sig Dispense Refill  . aspirin 81 MG tablet Take 81 mg by mouth daily.      [name] esomeprazole (NEXIUM) 40 MG capsule Take 1 capsule (40 mg total) by mouth 2 (two) times daily. 30 capsule 11  . etodolac (LODINE) 300 MG capsule TAKE ONE CAPSULE BY MOUTH EVERY 8 HOURS 180 capsule 1  . ezetimibe-simvastatin (VYTORIN) 10-40 MG per tablet Take  1 tablet by mouth daily. 90 tablet 3  . FLUVIRIN SUSP   0  . folic acid (FOLVITE) 885 MCG tablet Take 400 mcg by mouth daily.      . isosorbide mononitrate (IMDUR) 60 MG 24 hr tablet TAKE ONE TABLET BY MOUTH DAILY. 180 tablet 3  . losartan (COZAAR) 50 MG tablet TAKE ONE TABLET BY MOUTH ONCE DAILY 90 tablet 1  . meclizine (ANTIVERT) 25 MG tablet Take 1 tablet (25 mg total) by mouth 3 (three) times daily as needed for dizziness. 30 tablet 1  . nebivolol (BYSTOLIC) 10 MG tablet Take 0.5 tablets (5 mg total) by mouth daily. 45 tablet 1  . oxyCODONE-acetaminophen (PERCOCET) 10-325 MG per tablet Take 1 tablet by mouth 2 (two) times daily as needed. 60 tablet 0  . rOPINIRole (REQUIP) 2 MG tablet TAKE ONE TABLET BY MOUTH AT  BEDTIME 90 tablet 1  . tamsulosin (FLOMAX) 0.4 MG CAPS capsule Use as directed    . venlafaxine (EFFEXOR) 75 MG tablet TAKE ONE TABLET BY MOUTH DAILY. 90 tablet 3  . VYTORIN 10-40 MG per tablet TAKE ONE TABLET BY MOUTH AT BEDTIME 30 tablet 2  . nitroGLYCERIN (NITROSTAT) 0.4 MG SL tablet Place 1 tablet (0.4 mg total) under the tongue every 5 (five) minutes as needed for chest pain. 30 tablet 3   No current facility-administered medications for this visit.   Socially he is widowed in January 2013. He has 3 children 6 grandchildren. 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 There is no recent tobacco use.  no alcohol  ROS General: Negative; No fevers, chills, or night sweats;  HEENT: Negative; No changes in vision or hearing, sinus congestion, difficulty swallowing Pulmonary: Negative; No cough, wheezing, shortness of breath, hemoptysis Cardiovascular: Negative; No chest pain, presyncope, syncope, palpatations.  Minimal shortness of breath with activity GI: GERD; No nausea, vomiting, diarrhea, or abdominal pain GU: Negative; No dysuria, hematuria, or difficulty voiding Musculoskeletal: Positive for multiple joint aches involving his legs, hips, back, and shoulder Hematologic/Oncology: Negative; no easy bruising, bleeding Endocrine: Negative; no heat/cold intolerance; no diabetes Neuro: Negative; no changes in balance, headaches Skin: Negative; No rashes or skin lesions Psychiatric: Negative; No behavioral problems, depression Sleep: He no longer uses CPAP with his 25 pound weight loss.  His restless legs improved with low-dose Requip; No snoring, daytime sleepiness, hypersomnolence, bruxism, hypnogognic hallucinations, no cataplexy Other comprehensive 14 point system review is negative.   PE BP 134/68 mmHg  Pulse 56  Ht 5\' 9"  (1.753 m)  Wt 188 lb 9.6 oz (85.548 kg)  BMI 27.84 kg/m2  General: Alert,  oriented, no distress.  HEENT: Normocephalic, atraumatic. Pupils round and reactive; sclera anicteric; Nose without nasal septal hypertrophy Mouth/Parynx benign; Mallinpatti scale 3 Neck: No JVD, no carotid bruits with normal upstroke Lungs: clear to ausculatation and percussion; no wheezing or rales Chest wall: Nontender to palpation Heart: RRR, s1 s2 normal 1/6 systolic murmur.  No S3 or S4 gallop.  No diastolic murmur.. Abdomen: soft, nontender; no hepatosplenomehaly, BS+; abdominal aorta nontender and not dilated by palpation. Pulses 2+ Extremities: no clubbinbg cyanosis or edema, Homan's sign negative  Neurologic: grossly nonfocal Psychologic: Normal affect and mood. He did get depressed initially following his wife's death, but this has improved  ECG (independently read by me): sinus bradycardia 56 bpm.  Mild RV conduction delay.  Borderline first-degree AV block with a PR  interval at 208 ms.  No ectopy.  Prior 06/05/2013 ECG (independently read by me): Normal sinus rhythm at 63 beats per minute.  No ectopy; normal intervals.  Prior November 2014 ECG: Sinus rhythm at 68beats per minute. There is mild RV conduction delay. Intervals were normal.  LABS:  BMET    Component Value Date/Time   NA 139 03/30/2013 1053   K 4.3 03/30/2013 1053   CL 103 03/30/2013 1053   CO2 31 03/30/2013 1053   GLUCOSE 106* 03/30/2013 1053   BUN 18 03/30/2013 1053   CREATININE 1.03 03/30/2013 1053   CREATININE 1.0 02/10/2013 1414   CALCIUM 9.2 03/30/2013 1053   GFRNONAA 67.29 12/27/2009 0909   GFRAA 86 07/07/2007 1044     Hepatic Function Panel     Component Value Date/Time   PROT 6.8 03/30/2013 1053   ALBUMIN 4.1 03/30/2013 1053   AST 19 03/30/2013 1053   ALT 14 03/30/2013 1053   ALKPHOS 66 03/30/2013 1053   BILITOT 0.8 03/30/2013 1053   BILIDIR 0.2 04/08/2007 1000     CBC    Component Value Date/Time   WBC 6.0 03/30/2013 1053   WBC 6.1 04/25/2007 1127   RBC 4.59 03/30/2013 1053    RBC 4.16* 04/25/2007 1127   HGB 13.4 03/30/2013 1053   HGB 13.0 04/25/2007 1127   HCT 40.0 03/30/2013 1053   HCT 37.0* 04/25/2007 1127   PLT 154 03/30/2013 1053   PLT 227 04/25/2007 1127   MCV 87.1 03/30/2013 1053   MCV 89.0 04/25/2007 1127   MCH 29.2 03/30/2013 1053   MCH 31.3 04/25/2007 1127   MCHC 33.5 03/30/2013 1053   MCHC 35.2 04/25/2007 1127   RDW 13.6 03/30/2013 1053   RDW 13.8 04/25/2007 1127   LYMPHSABS 1.9 12/27/2009 0909   LYMPHSABS 2.5 04/25/2007 1127   MONOABS 0.4 12/27/2009 0909   MONOABS 0.6 04/25/2007 1127   EOSABS 0.2 12/27/2009 0909   EOSABS 0.0 04/25/2007 1127   BASOSABS 0.0 12/27/2009 0909   BASOSABS 0.0 04/25/2007 1127     BNP No results found for: PROBNP  Lipid Panel     Component Value Date/Time   CHOL 165 04/17/2008 0911   TRIG 80 03/30/2013 1053   TRIG 245* 04/08/2007 1000   HDL 32.20* 04/17/2008 0911   CHOLHDL 5.3 CALC 04/08/2007 1000   VLDL 49* 04/08/2007 1000   LDLCALC 66 03/30/2013 1053     RADIOLOGY: No results found.    ASSESSMENT AND PLAN:  [name] continues is 14 years status post his CABG revascularization surgery. His last catheterization was 11 years ago for recurrent chest pain which continued to show patent grafts. His ejection fraction is 40-45% which was documented on his last echo in August 2012. A myocardial perfusion study at that time showed normal perfusion. His blood pressure today is controlled and on repeat by me was 128/70 on his medical regimen consisting of losartan 50 mg Bystolic 5 mg in addition to his isosorbide mononitrate 60 mg.  He's not having anginal symptoms.  He is tolerating Vytorin 10/44.  Hyperlipidemia.  There is no edema.  His GERD is controlled with Nexium.  He does admit to having allergies to ragweed.  His lipid studies in March were revealed an LDL cholesterol of 66 however, his triglycerides were elevated as were his VLDL, and HDL remained low, consistent with an atherogenic dyslipidemia  pattern.  We discussed diet, reduction of carbohydrates and avoidance of sweets.  He sees [name] for his primary  care.  I will see him in 6 months for cardiology reevaluation or sooner if problems arise.  Troy Sine, MD, [facility]  11/20/2013 12:56 PM

## 2013-11-20 NOTE — Patient Instructions (Signed)
Your physician wants you to follow-up in: 6 months or sooner if needed with Dr. Kelly. You will receive a reminder letter in the mail two months in advance. If you don't receive a letter, please call our office to schedule the follow-up appointment. 

## 2013-11-21 DIAGNOSIS — R0602 Shortness of breath: Secondary | ICD-10-CM

## 2013-11-21 DIAGNOSIS — R0609 Other forms of dyspnea: Secondary | ICD-10-CM | POA: Insufficient documentation

## 2013-11-21 DIAGNOSIS — E785 Hyperlipidemia, unspecified: Secondary | ICD-10-CM | POA: Insufficient documentation

## 2013-11-21 DIAGNOSIS — R0600 Dyspnea, unspecified: Secondary | ICD-10-CM | POA: Insufficient documentation

## 2013-11-29 ENCOUNTER — Ambulatory Visit (INDEPENDENT_AMBULATORY_CARE_PROVIDER_SITE_OTHER): Payer: Medicare Other | Admitting: Internal Medicine

## 2013-11-29 ENCOUNTER — Encounter: Payer: Self-pay | Admitting: Internal Medicine

## 2013-11-29 DIAGNOSIS — I10 Essential (primary) hypertension: Secondary | ICD-10-CM

## 2013-11-29 DIAGNOSIS — M79605 Pain in left leg: Secondary | ICD-10-CM

## 2013-11-29 DIAGNOSIS — E785 Hyperlipidemia, unspecified: Secondary | ICD-10-CM

## 2013-11-29 DIAGNOSIS — M79604 Pain in right leg: Secondary | ICD-10-CM

## 2013-11-29 DIAGNOSIS — I2583 Coronary atherosclerosis due to lipid rich plaque: Secondary | ICD-10-CM

## 2013-11-29 DIAGNOSIS — D518 Other vitamin B12 deficiency anemias: Secondary | ICD-10-CM

## 2013-11-29 DIAGNOSIS — I2510 Atherosclerotic heart disease of native coronary artery without angina pectoris: Secondary | ICD-10-CM

## 2013-11-29 MED ORDER — EZETIMIBE-SIMVASTATIN 10-40 MG PO TABS: 1.0000 | ORAL_TABLET | Freq: Every day | ORAL | Status: DC

## 2013-11-29 MED ORDER — CYANOCOBALAMIN 1000 MCG/ML IJ SOLN
1000.0000 ug | Freq: Once | INTRAMUSCULAR | Status: AC
  Administered 2013-11-29: 1000 ug via INTRAMUSCULAR

## 2013-11-29 MED ORDER — OXYCODONE-ACETAMINOPHEN 10-325 MG PO TABS: 1.0000 | ORAL_TABLET | Freq: Two times a day (BID) | ORAL | Status: DC | PRN

## 2013-11-29 NOTE — Progress Notes (Signed)
Subjective:    Patient ID: [name], male    DOB: [birth year], 73 y.o.   MRN: [id number]  HPI 73 year old patient who is seen today in follow-up.  He has a history of coronary artery disease and has been seen by cardiology recently.  He has been quite stable.  He has treated hypertension and a history of dyslipidemia.  He remains on Vytorin, which she continues to tolerate well He remains on narcotics due to back pain and peripheral neuropathy pain in general doing about the same His cardiopulmonary status is stable.  He has stable mild dyspnea on exertion.  He states that he went deer hunting yesterday He has been seen by GI recently and does have a history of Barrett's esophagus  Past Medical History  Diagnosis Date  . Esophagitis   . Colon polyps   . History of bladder cancer   . Esophagus, Barrett's   . Stenosis of esophagus   . Localized osteoarthrosis, lower leg   . Malaise and fatigue   . Hypothyroidism   . Hypertension   . Anemia   . B12 deficiency anemia   . GERD (gastroesophageal reflux disease)   . Hyperlipidemia   . Depression   . Allergy   . CAD (coronary artery disease)   . COPD (chronic obstructive pulmonary disease)   . Low back pain   . Blood transfusion without reported diagnosis   . Cancer     bladder cancer 8 times    History   Social History  . Marital Status: Married    Spouse Name: N/A    Number of Children: N/A  . Years of Education: N/A   Occupational History  . Not on file.   Social History Main Topics  . Smoking status: Former Smoker    Quit date: 03/30/1973  . Smokeless tobacco: Never Used     Comment: quit in 1977  . Alcohol Use: No  . Drug Use: No  . Sexual Activity: Yes   Other Topics Concern  . Not on file   Social History Narrative    Past Surgical History  Procedure Laterality Date  . Coronary artery bypass graft      x4  . Cervical discectomy      ACDF  . Lumbar laminectomy      and fusion  . Bladder  cancer    . Popliteal synovial cyst excision    . Knee arthroscopy    . Esophagogastroduodenoscopy  04/29/2010  . Colonoscopy  11/17/2005    normal     Family History  Problem Relation Age of Onset  . Coronary artery disease    . Cancer Mother     melanoma  . Stroke Father   . Hypertension Father   . Colon cancer Neg Hx   . Esophageal cancer Neg Hx   . Stomach cancer Neg Hx   . Rectal cancer Neg Hx     No Known Allergies  Current Outpatient Prescriptions on File Prior to Visit  Medication Sig Dispense Refill  . aspirin 81 MG tablet Take 81 mg by mouth daily.      [name] esomeprazole (NEXIUM) 40 MG capsule Take 1 capsule (40 mg total) by mouth 2 (two) times daily. 30 capsule 11  . etodolac (LODINE) 300 MG capsule TAKE ONE CAPSULE BY MOUTH EVERY 8 HOURS 616 capsule 1  . folic acid (FOLVITE) 073 MCG tablet Take 400 mcg by mouth daily.      . isosorbide mononitrate (IMDUR)  60 MG 24 hr tablet TAKE ONE TABLET BY MOUTH DAILY. 180 tablet 3  . losartan (COZAAR) 50 MG tablet TAKE ONE TABLET BY MOUTH ONCE DAILY 90 tablet 1  . meclizine (ANTIVERT) 25 MG tablet Take 1 tablet (25 mg total) by mouth 3 (three) times daily as needed for dizziness. 30 tablet 1  . nebivolol (BYSTOLIC) 10 MG tablet Take 0.5 tablets (5 mg total) by mouth daily. 45 tablet 1  . nitroGLYCERIN (NITROSTAT) 0.4 MG SL tablet Place 1 tablet (0.4 mg total) under the tongue every 5 (five) minutes as needed for chest pain. 30 tablet 3  . rOPINIRole (REQUIP) 2 MG tablet TAKE ONE TABLET BY MOUTH AT BEDTIME 90 tablet 1  . tamsulosin (FLOMAX) 0.4 MG CAPS capsule Use as directed    . venlafaxine (EFFEXOR) 75 MG tablet TAKE ONE TABLET BY MOUTH DAILY. 90 tablet 3   No current facility-administered medications on file prior to visit.    BP 130/62 mmHg  Pulse 65  Temp(Src) 98.1 F (36.7 C) (Oral)  Resp 20  Ht 5\' 9"  (1.753 m)  Wt 191 lb (86.637 kg)  BMI 28.19 kg/m2  SpO2 97%      Review of Systems  Constitutional: Negative  for fever, chills, appetite change and fatigue.  HENT: Negative for congestion, dental problem, ear pain, hearing loss, sore throat, tinnitus, trouble swallowing and voice change.   Eyes: Negative for pain, discharge and visual disturbance.  Respiratory: Negative for cough, chest tightness, wheezing and stridor.   Cardiovascular: Negative for chest pain, palpitations and leg swelling.  Gastrointestinal: Negative for nausea, vomiting, abdominal pain, diarrhea, constipation, blood in stool and abdominal distention.  Genitourinary: Negative for urgency, hematuria, flank pain, discharge, difficulty urinating and genital sores.  Musculoskeletal: Positive for back pain and arthralgias. Negative for myalgias, joint swelling, gait problem and neck stiffness.  Skin: Negative for rash.  Neurological: Positive for numbness. Negative for dizziness, syncope, speech difficulty, weakness and headaches.  Hematological: Negative for adenopathy. Does not bruise/bleed easily.  Psychiatric/Behavioral: Negative for behavioral problems and dysphoric mood. The patient is not nervous/anxious.        Objective:   Physical Exam  Constitutional: He is oriented to person, place, and time. He appears well-developed.  HENT:  Head: Normocephalic.  Right Ear: External ear normal.  Left Ear: External ear normal.  Eyes: Conjunctivae and EOM are normal.  Neck: Normal range of motion.  Cardiovascular: Normal rate and normal heart sounds.   Pulmonary/Chest: Breath sounds normal.  Abdominal: Bowel sounds are normal.  Musculoskeletal: Normal range of motion. He exhibits no edema or tenderness.  Neurological: He is alert and oriented to person, place, and time.  Psychiatric: He has a normal mood and affect. His behavior is normal.          Assessment & Plan:   Coronary artery disease.  Status post CABG Hypertension, stable Dyslipidemia.  Continue Vytorin.  Check lipid profile next visit.  Patient is nonfasting  today Peripheral neuropathy Chronic low back pain.  Analgesics refilled  CPX 6 months

## 2013-11-29 NOTE — Progress Notes (Signed)
Pre visit review using our clinic review tool, if applicable. No additional management support is needed unless otherwise documented below in the visit note. 

## 2013-11-29 NOTE — Patient Instructions (Signed)
Limit your sodium (Salt) intake    It is important that you exercise regularly, at least 20 minutes 3 to 4 times per week.  If you develop chest pain or shortness of breath seek  medical attention.  Return in 6 months for follow-up  

## 2014-01-05 ENCOUNTER — Telehealth: Payer: Self-pay | Admitting: Internal Medicine

## 2014-01-05 NOTE — Telephone Encounter (Signed)
Patient needs re-fill on oxyCODONE-acetaminophen (PERCOCET) 10-325 MG per tablet

## 2014-01-05 NOTE — Telephone Encounter (Signed)
Patient is aware Dr. Raliegh Ip will be back on Monday.

## 2014-01-08 MED ORDER — OXYCODONE-ACETAMINOPHEN 10-325 MG PO TABS: 1.0000 | ORAL_TABLET | Freq: Two times a day (BID) | ORAL | Status: DC | PRN

## 2014-01-08 NOTE — Telephone Encounter (Signed)
OK #60 

## 2014-01-08 NOTE — Telephone Encounter (Signed)
Okay to refill Oxycodone?

## 2014-01-08 NOTE — Telephone Encounter (Signed)
Left detailed message Rx ready for pickup. Rx printed and signed by Dr. Raliegh Ip.

## 2014-01-15 ENCOUNTER — Telehealth: Payer: Self-pay | Admitting: Internal Medicine

## 2014-01-15 NOTE — Telephone Encounter (Signed)
Spoke to pt told him Rx ready for pickup left message on 12/21 that it is at the front desk. Pt verbalized understanding.

## 2014-01-15 NOTE — Telephone Encounter (Signed)
Pt needs new rx oxycodone °

## 2014-01-22 ENCOUNTER — Other Ambulatory Visit: Payer: Self-pay | Admitting: Internal Medicine

## 2014-01-23 ENCOUNTER — Other Ambulatory Visit: Payer: Self-pay

## 2014-01-23 MED ORDER — ISOSORBIDE MONONITRATE ER 60 MG PO TB24: ORAL_TABLET | ORAL | Status: DC

## 2014-01-23 NOTE — Telephone Encounter (Signed)
Rx request for Indur 60 mg- TAke 1 tablet by mouth once daily #180  Pharm:  [facility]  Rx sent to pharmacy.

## 2014-02-05 ENCOUNTER — Ambulatory Visit (INDEPENDENT_AMBULATORY_CARE_PROVIDER_SITE_OTHER): Payer: Medicare Other | Admitting: *Deleted

## 2014-02-05 ENCOUNTER — Other Ambulatory Visit: Payer: Self-pay | Admitting: Internal Medicine

## 2014-02-05 DIAGNOSIS — D518 Other vitamin B12 deficiency anemias: Secondary | ICD-10-CM | POA: Diagnosis not present

## 2014-02-05 MED ORDER — OXYCODONE-ACETAMINOPHEN 10-325 MG PO TABS: 1.0000 | ORAL_TABLET | Freq: Two times a day (BID) | ORAL | Status: DC | PRN

## 2014-02-05 MED ORDER — CYANOCOBALAMIN 1000 MCG/ML IJ SOLN
1000.0000 ug | Freq: Once | INTRAMUSCULAR | Status: AC
  Administered 2014-02-05: 1000 ug via INTRAMUSCULAR

## 2014-02-05 NOTE — Telephone Encounter (Signed)
Pt requesting refill for Percocet, please advise if okay? Last fill 12/21, #60.

## 2014-02-05 NOTE — Telephone Encounter (Signed)
Discussed with Dr. Raliegh Ip, okay to refill. Rx printed and signed. Pt here to pickup.

## 2014-02-05 NOTE — Telephone Encounter (Signed)
Patient need a re-fill on oxyCODONE-acetaminophen (PERCOCET) 10-325 MG per tablet

## 2014-02-27 ENCOUNTER — Other Ambulatory Visit: Payer: Self-pay | Admitting: Internal Medicine

## 2014-03-08 ENCOUNTER — Ambulatory Visit (INDEPENDENT_AMBULATORY_CARE_PROVIDER_SITE_OTHER): Payer: Medicare Other | Admitting: *Deleted

## 2014-03-08 ENCOUNTER — Telehealth: Payer: Self-pay | Admitting: Internal Medicine

## 2014-03-08 DIAGNOSIS — D518 Other vitamin B12 deficiency anemias: Secondary | ICD-10-CM | POA: Diagnosis not present

## 2014-03-08 MED ORDER — OXYCODONE-ACETAMINOPHEN 10-325 MG PO TABS: 1.0000 | ORAL_TABLET | Freq: Two times a day (BID) | ORAL | Status: DC | PRN

## 2014-03-08 MED ORDER — CYANOCOBALAMIN 1000 MCG/ML IJ SOLN
1000.0000 ug | Freq: Once | INTRAMUSCULAR | Status: AC
  Administered 2014-03-08: 1000 ug via INTRAMUSCULAR

## 2014-03-08 NOTE — Telephone Encounter (Signed)
Pt request refill oxyCODONE-acetaminophen (PERCOCET) 10-325 MG per tablet  Pt called today to get a b12 and asked if he could get his rx while he is here.  Advised pt I would ask, but we do ask for up to 3 days. Pt voiced understanding,

## 2014-03-08 NOTE — Telephone Encounter (Signed)
Pt notified Rx ready for pickup. Rx printed and signed.  

## 2014-03-13 DIAGNOSIS — R3989 Other symptoms and signs involving the genitourinary system: Secondary | ICD-10-CM | POA: Diagnosis not present

## 2014-04-04 ENCOUNTER — Telehealth: Payer: Self-pay | Admitting: Internal Medicine

## 2014-04-04 ENCOUNTER — Ambulatory Visit (INDEPENDENT_AMBULATORY_CARE_PROVIDER_SITE_OTHER): Payer: Medicare Other | Admitting: Internal Medicine

## 2014-04-04 DIAGNOSIS — D518 Other vitamin B12 deficiency anemias: Secondary | ICD-10-CM

## 2014-04-04 MED ORDER — CYANOCOBALAMIN 1000 MCG/ML IJ SOLN
1000.0000 ug | Freq: Once | INTRAMUSCULAR | Status: AC
  Administered 2014-04-04: 1000 ug via INTRAMUSCULAR

## 2014-04-04 MED ORDER — OXYCODONE-ACETAMINOPHEN 10-325 MG PO TABS: 1.0000 | ORAL_TABLET | Freq: Two times a day (BID) | ORAL | Status: DC | PRN

## 2014-04-04 NOTE — Telephone Encounter (Signed)
Pt notified Rx ready for pickup. Rx printed and signed.  

## 2014-04-04 NOTE — Telephone Encounter (Signed)
Pt need new rx oxycodone °

## 2014-04-25 ENCOUNTER — Other Ambulatory Visit: Payer: Self-pay | Admitting: Internal Medicine

## 2014-05-01 DIAGNOSIS — Z8551 Personal history of malignant neoplasm of bladder: Secondary | ICD-10-CM | POA: Diagnosis not present

## 2014-05-01 DIAGNOSIS — N359 Urethral stricture, unspecified: Secondary | ICD-10-CM | POA: Diagnosis not present

## 2014-05-01 DIAGNOSIS — R3989 Other symptoms and signs involving the genitourinary system: Secondary | ICD-10-CM | POA: Diagnosis not present

## 2014-05-10 ENCOUNTER — Telehealth: Payer: Self-pay | Admitting: Internal Medicine

## 2014-05-10 ENCOUNTER — Ambulatory Visit (INDEPENDENT_AMBULATORY_CARE_PROVIDER_SITE_OTHER): Payer: Medicare Other | Admitting: *Deleted

## 2014-05-10 DIAGNOSIS — D518 Other vitamin B12 deficiency anemias: Secondary | ICD-10-CM

## 2014-05-10 MED ORDER — OXYCODONE-ACETAMINOPHEN 10-325 MG PO TABS: 1.0000 | ORAL_TABLET | Freq: Two times a day (BID) | ORAL | Status: DC | PRN

## 2014-05-10 MED ORDER — CYANOCOBALAMIN 1000 MCG/ML IJ SOLN
1000.0000 ug | Freq: Once | INTRAMUSCULAR | Status: AC
  Administered 2014-05-10: 1000 ug via INTRAMUSCULAR

## 2014-05-10 NOTE — Telephone Encounter (Signed)
Pt request refill oxyCODONE-acetaminophen (PERCOCET) 10-325 MG per tablet  Pt coming for b12 inj today and if it all poss would like to pu rx

## 2014-05-10 NOTE — Telephone Encounter (Signed)
Tried to contact pt voicemail box is full unable to leave message. Rx ready for pickup. Rx printed and signed by Dr. Sherren Mocha.

## 2014-05-21 ENCOUNTER — Ambulatory Visit (INDEPENDENT_AMBULATORY_CARE_PROVIDER_SITE_OTHER): Payer: Medicare Other | Admitting: Cardiovascular Disease

## 2014-05-21 ENCOUNTER — Encounter: Payer: Self-pay | Admitting: Cardiovascular Disease

## 2014-05-21 VITALS — BP 130/64 | HR 56 | Ht 69.0 in | Wt 193.4 lb

## 2014-05-21 DIAGNOSIS — I2583 Coronary atherosclerosis due to lipid rich plaque: Principal | ICD-10-CM

## 2014-05-21 DIAGNOSIS — E785 Hyperlipidemia, unspecified: Secondary | ICD-10-CM

## 2014-05-21 DIAGNOSIS — I10 Essential (primary) hypertension: Secondary | ICD-10-CM | POA: Diagnosis not present

## 2014-05-21 DIAGNOSIS — I255 Ischemic cardiomyopathy: Secondary | ICD-10-CM

## 2014-05-21 DIAGNOSIS — Z79899 Other long term (current) drug therapy: Secondary | ICD-10-CM

## 2014-05-21 DIAGNOSIS — I2510 Atherosclerotic heart disease of native coronary artery without angina pectoris: Secondary | ICD-10-CM

## 2014-05-21 LAB — COMPREHENSIVE METABOLIC PANEL
ALT: 11 U/L (ref 0–53)
AST: 18 U/L (ref 0–37)
Albumin: 4.3 g/dL (ref 3.5–5.2)
Alkaline Phosphatase: 71 U/L (ref 39–117)
BILIRUBIN TOTAL: 0.7 mg/dL (ref 0.2–1.2)
BUN: 27 mg/dL — ABNORMAL HIGH (ref 6–23)
CO2: 30 meq/L (ref 19–32)
CREATININE: 1.32 mg/dL (ref 0.50–1.35)
Calcium: 9.6 mg/dL (ref 8.4–10.5)
Chloride: 104 mEq/L (ref 96–112)
GLUCOSE: 121 mg/dL — AB (ref 70–99)
POTASSIUM: 5.6 meq/L — AB (ref 3.5–5.3)
Sodium: 139 mEq/L (ref 135–145)
Total Protein: 7.4 g/dL (ref 6.0–8.3)

## 2014-05-21 LAB — CBC
HCT: 39.7 % (ref 39.0–52.0)
HEMOGLOBIN: 13.4 g/dL (ref 13.0–17.0)
MCH: 29.8 pg (ref 26.0–34.0)
MCHC: 33.8 g/dL (ref 30.0–36.0)
MCV: 88.2 fL (ref 78.0–100.0)
MPV: 9.1 fL (ref 8.6–12.4)
Platelets: 144 10*3/uL — ABNORMAL LOW (ref 150–400)
RBC: 4.5 MIL/uL (ref 4.22–5.81)
RDW: 14.2 % (ref 11.5–15.5)
WBC: 6 10*3/uL (ref 4.0–10.5)

## 2014-05-21 LAB — LIPID PANEL
Cholesterol: 140 mg/dL (ref 0–200)
HDL: 30 mg/dL — ABNORMAL LOW (ref >=40)
LDL Cholesterol: 66 mg/dL (ref 0–99)
Total CHOL/HDL Ratio: 4.7 Ratio
Triglycerides: 221 mg/dL — ABNORMAL HIGH (ref <150)
VLDL: 44 mg/dL — ABNORMAL HIGH (ref 0–40)

## 2014-05-21 NOTE — Progress Notes (Signed)
Patient ID: [name], male   DOB: 1940-04-10, 74 y.o.   MRN: [id number]      HPI: [name] is a 74 y.o. male who presents to the office today for a 6 month cardiology followup evaluation.  [name] has known CAD and underwent CABG revascularization surgery in 2001 with a LIMA to his LAD, vein to the marginal, vein to the RCA. Catheterization in 2004 showed patent grafts. An echo Doppler study in August 2012 revealed an ejection fraction of 40-45% with moderate septal hypokinesis with grade 1 diastolic dysfunction as well as aortic valve sclerosis. Additional problems include GERD, mild emphysema, as well as obstructive sleep apnea. He no longer utilizes CPAP. He has lost approximately 25 pounds since his wife's death from a motor vehicle accident in January 2013.  He remains active and notes mild shortness of breath with activity particularly when walking up hills but this has not changed.  He also is a history of restless leg syndrome and has benefited with requip. Laboratory in 2013 demonstrated LDL 57 with LDL particle #1035, small LDL particle number was 611, slightly increased, HDLC was 35 triglycerides 93 total cholesterol 111 initial resistance course slightly elevated at 53.  BP blood work in March 2015 revealed normal renal function.  Fasting glucose was minimally increased at 106.  Total cholesterol was 116 triglycerides 80, and LDL 66, with an HDL of 34.  LDL particle number was slightly increased at 1190.  Over the past 6 months [name] has felt well. He denies recurrent anginal symptoms.  He denies any palpitations.  There is no presyncope or syncope.  His last myocardial perfusion study was done in August 2012. 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  Does have issues with his in his legs and his peripheral neuropathy.  He also notes some left shoulder discomfort.  He has not had laboratory year.  He  presents for evaluation.  Past Medical History  Diagnosis Date  . Esophagitis   . Colon polyps   . History of bladder cancer   . Esophagus, Barrett's   . Stenosis of esophagus   . Localized osteoarthrosis, lower leg   . Malaise and fatigue   . Hypothyroidism   . Hypertension   . Anemia   . B12 deficiency anemia   . GERD (gastroesophageal reflux disease)   . Hyperlipidemia   . Depression   . Allergy   . CAD (coronary artery disease)   . COPD (chronic obstructive pulmonary disease)   . Low back pain   . Blood transfusion without reported diagnosis   . Cancer     bladder cancer 8 times    Past Surgical History  Procedure Laterality Date  . Coronary artery bypass graft      x4  . Cervical discectomy      ACDF  . Lumbar laminectomy      and fusion  . Bladder cancer    . Popliteal synovial cyst excision    . Knee arthroscopy    . Esophagogastroduodenoscopy  04/29/2010  . Colonoscopy  11/17/2005    normal     No Known Allergies  Current Outpatient Prescriptions  Medication Sig Dispense Refill  . aspirin 81 MG tablet Take 81 mg by mouth daily.      [name] BYSTOLIC 10 MG tablet TAKE ONE-HALF TABLET BY MOUTH ONCE DAILY 45 tablet 1  . esomeprazole (NEXIUM) 40 MG capsule  Take 1 capsule (40 mg total) by mouth 2 (two) times daily. 30 capsule 11  . etodolac (LODINE) 300 MG capsule TAKE ONE CAPSULE BY MOUTH EVERY 8 HOURS 180 capsule 1  . ezetimibe-simvastatin (VYTORIN) 10-40 MG per tablet Take 1 tablet by mouth daily. 90 tablet 3  . folic acid (FOLVITE) 086 MCG tablet Take 400 mcg by mouth daily.      . isosorbide mononitrate (IMDUR) 60 MG 24 hr tablet TAKE ONE TABLET BY MOUTH DAILY. 180 tablet 1  . losartan (COZAAR) 50 MG tablet TAKE ONE TABLET BY MOUTH ONCE DAILY 90 tablet 1  . meclizine (ANTIVERT) 25 MG tablet TAKE 1 TABLET BY MOUTH 3 TIMES DAILY AS NEEDED FOR DIZZINESS. 30 tablet 0  . nitroGLYCERIN (NITROSTAT) 0.4 MG SL tablet Place 1 tablet (0.4 mg total) under the tongue every  5 (five) minutes as needed for chest pain. 30 tablet 3  . oxyCODONE-acetaminophen (PERCOCET) 10-325 MG per tablet Take 1 tablet by mouth 2 (two) times daily as needed. 60 tablet 0  . rOPINIRole (REQUIP) 2 MG tablet TAKE ONE TABLET BY MOUTH AT BEDTIME 90 tablet 1  . venlafaxine (EFFEXOR) 75 MG tablet TAKE ONE TABLET BY MOUTH DAILY. 90 tablet 3  . gabapentin (NEURONTIN) 300 MG capsule Take 1 capsule by mouth 2 (two) times daily.     No current facility-administered medications for this visit.   Socially he is widowed in January 2013. He has 3 children 6 grandchildren. 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 There is no recent tobacco use.  no alcohol  ROS General: Negative; No fevers, chills, or night sweats;  HEENT: Negative; No changes in vision or hearing, sinus congestion, difficulty swallowing Pulmonary: Negative; No cough, wheezing, shortness of breath, hemoptysis Cardiovascular: Negative; No chest pain, presyncope, syncope, palpatations.  Minimal shortness of breath with activity GI: GERD; No nausea, vomiting, diarrhea, or abdominal pain GU: Negative; No dysuria, hematuria, or difficulty voiding Musculoskeletal: Positive for multiple joint aches involving his legs, hips, back, and shoulder Hematologic/Oncology: Negative; no easy bruising, bleeding Endocrine: Negative; no heat/cold intolerance; no diabetes Neuro: Negative; no changes in balance, headaches Skin: Negative; No rashes or skin lesions Psychiatric: Negative; No behavioral problems, depression Sleep: He no longer uses CPAP with his 25 pound weight loss.  His restless legs improved with low-dose Requip; No snoring, daytime sleepiness, hypersomnolence, bruxism, hypnogognic hallucinations, no cataplexy Other comprehensive 14 point system review is negative.   PE BP 130/64 mmHg  Pulse 56  Ht $R'5\' 9"'nq$  (1.753 m)  Wt 193 lb 6.4 oz (87.726 kg)  BMI  28.55 kg/m2  General: Alert, oriented, no distress.  HEENT: Normocephalic, atraumatic. Pupils round and reactive; sclera anicteric; Nose without nasal septal hypertrophy Mouth/Parynx benign; Mallinpatti scale 3 Neck: No JVD, no carotid bruits with normal upstroke Lungs: clear to ausculatation and percussion; no wheezing or rales Chest wall: Nontender to palpation Heart: RRR, s1 s2 normal 1/6 systolic murmur.  No S3 or S4 gallop.  No diastolic murmur.. Abdomen: soft, nontender; no hepatosplenomehaly, BS+; abdominal aorta nontender and not dilated by palpation. Pulses 2+ Extremities: no clubbinbg cyanosis or edema, Homan's sign negative  Neurologic: grossly nonfocal Psychologic: Normal affect and mood.   ECG (independently read by me): Sinus bradycardia 56 bpm.  No ectopy.  Mild RV conduction delay.  QTc interval 395  ECG (independently read by me): sinus bradycardia 56 bpm.  Mild RV  conduction delay.  Borderline first-degree AV block with a PR interval at 208 ms.  No ectopy.  Prior 06/05/2013 ECG (independently read by me): Normal sinus rhythm at 63 beats per minute.  No ectopy; normal intervals.  Prior November 2014 ECG: Sinus rhythm at 68beats per minute. There is mild RV conduction delay. Intervals were normal.  LABS:  BMP Latest Ref Rng 03/30/2013 02/10/2013 10/01/2010  Glucose 70 - 99 mg/dL 106(H) 81 106(H)  BUN 6 - 23 mg/dL 18 17 26(H)  Creatinine 0.50 - 1.35 mg/dL 1.03 1.0 1.1  Sodium 135 - 145 mEq/L 139 141 142  Potassium 3.5 - 5.3 mEq/L 4.3 4.1 4.5  Chloride 96 - 112 mEq/L 103 103 106  CO2 19 - 32 mEq/L 31 33(H) 29  Calcium 8.4 - 10.5 mg/dL 9.2 9.1 8.8   Hepatic Function Latest Ref Rng 03/30/2013 04/25/2007 04/08/2007  Total Protein 6.0 - 8.3 g/dL 6.8 7.3 6.9  Albumin 3.5 - 5.2 g/dL 4.1 4.2 3.8  AST 0 - 37 U/L $Remo'19 14 19  'GUDCx$ ALT 0 - 53 U/L $Remo'14 13 22  'qxrvI$ Alk Phosphatase 39 - 117 U/L 66 66 61  Total Bilirubin 0.2 - 1.2 mg/dL 0.8 0.4 0.8  Bilirubin, Direct 0.0-0.3 mg/dL - - 0.2     CBC Latest Ref Rng 03/30/2013 12/27/2009 09/17/2008  WBC 4.0 - 10.5 K/uL 6.0 5.1 9.0  Hemoglobin 13.0 - 17.0 g/dL 13.4 12.8(L) 13.2  Hematocrit 39.0 - 52.0 % 40.0 37.6(L) 36.6(L)  Platelets 150 - 400 K/uL 154 140.0(L) 168.0     Lipid Panel     Component Value Date/Time   CHOL 116 03/30/2013 1053   CHOL 165 04/17/2008 0911   TRIG 80 03/30/2013 1053   TRIG 245* 04/08/2007 1000   HDL 34* 03/30/2013 1053   HDL 32.20* 04/17/2008 0911   CHOLHDL 5.3 CALC 04/08/2007 1000   VLDL 49* 04/08/2007 1000   LDLCALC 66 03/30/2013 1053     RADIOLOGY: No results found.    ASSESSMENT AND PLAN:  [name] is now 74 years old and is 15  years status post his CABG revascularization surgery. His last catheterization was 12 years ago for recurrent chest pain which continued to show patent grafts. His ejection fraction is 40-45% which was documented on his last echo in August 2012. A myocardial perfusion study at that time showed normal perfusion. His blood pressure today is controlled on his  medical regimen consisting of losartan 50 mg Bystolic 5 mg in addition to his isosorbide mononitrate 60 mg.  He's not having anginal symptoms.  He is tolerating Vytorin 10/44 for his hyperlipidemia.  He does have some arthritic issues, particularly involving his left shoulder.  He also notes some initial weakness in his legs and has peripheral neuropathy for which he takes gabapentin with improvement.  He has been taking Effexor for many years for depression and he seems to be in excellent frame of mind and mood.  He is fasting today.  Complete set of laboratory will be obtained in the fasting state.  I will contact him and adjustments to his medications will be made if necessary.  I will see him in 6 months for follow-up evaluation.  Prior to that office visit, he will undergo a 28.  Follow-up nuclear perfusion study, which will be over 15 years since his CABG surgery to continue to self graft patency.  There is no  edema.  His GERD is controlled with Nexium.    Troy Sine, MD, [facility]  05/21/2014 8:31 AM

## 2014-05-21 NOTE — Patient Instructions (Signed)
Your physician recommends that you return for lab work in: today  Your physician has requested that you have a [place] an d Office visit in November 2016

## 2014-05-30 ENCOUNTER — Other Ambulatory Visit: Payer: Medicare Other

## 2014-06-06 ENCOUNTER — Encounter: Payer: Self-pay | Admitting: Internal Medicine

## 2014-06-06 ENCOUNTER — Ambulatory Visit (INDEPENDENT_AMBULATORY_CARE_PROVIDER_SITE_OTHER): Payer: Medicare Other | Admitting: Internal Medicine

## 2014-06-06 VITALS — BP 150/70 | HR 65 | Temp 97.8°F | Resp 20 | Ht 67.75 in | Wt 199.0 lb

## 2014-06-06 DIAGNOSIS — Z0000 Encounter for general adult medical examination without abnormal findings: Secondary | ICD-10-CM | POA: Diagnosis not present

## 2014-06-06 DIAGNOSIS — I2510 Atherosclerotic heart disease of native coronary artery without angina pectoris: Secondary | ICD-10-CM

## 2014-06-06 DIAGNOSIS — I10 Essential (primary) hypertension: Secondary | ICD-10-CM

## 2014-06-06 DIAGNOSIS — G2581 Restless legs syndrome: Secondary | ICD-10-CM

## 2014-06-06 DIAGNOSIS — D518 Other vitamin B12 deficiency anemias: Secondary | ICD-10-CM | POA: Diagnosis not present

## 2014-06-06 DIAGNOSIS — K2270 Barrett's esophagus without dysplasia: Secondary | ICD-10-CM

## 2014-06-06 DIAGNOSIS — E785 Hyperlipidemia, unspecified: Secondary | ICD-10-CM

## 2014-06-06 DIAGNOSIS — E034 Atrophy of thyroid (acquired): Secondary | ICD-10-CM

## 2014-06-06 DIAGNOSIS — K219 Gastro-esophageal reflux disease without esophagitis: Secondary | ICD-10-CM

## 2014-06-06 DIAGNOSIS — M25562 Pain in left knee: Secondary | ICD-10-CM

## 2014-06-06 MED ORDER — CYANOCOBALAMIN 1000 MCG/ML IJ SOLN
1000.0000 ug | Freq: Once | INTRAMUSCULAR | Status: AC
  Administered 2014-06-06: 1000 ug via INTRAMUSCULAR

## 2014-06-06 MED ORDER — OXYCODONE-ACETAMINOPHEN 10-325 MG PO TABS: 1.0000 | ORAL_TABLET | Freq: Two times a day (BID) | ORAL | Status: DC | PRN

## 2014-06-06 NOTE — Progress Notes (Signed)
Pre visit review using our clinic review tool, if applicable. No additional management support is needed unless otherwise documented below in the visit note. 

## 2014-06-06 NOTE — Progress Notes (Signed)
Subjective:    Patient ID: [name], male    DOB: 11-21-1940, 74 y.o.   MRN: [id number]  HPI 60 -year-old patient who is seen today for an annual physical.   He is a former patient [name] and has transferred his care He is followed by multiple specialists including cardiology urology, and GI. Doing well today except for chronic pain.  He complains left shoulder pain and bilateral leg pain, left greater than the right.  He is on a 3 times a day regimen of oxycodone chronically.  Social history he is a Theme park manager.  Wife died in a motor vehicle accident a couple of years ago.  Former smoker  Family history.  Both parents died at approximately age 36 father from complications of cerebrovascular disease and mother from complications of a melanoma.  One brother with obesity  Past Medical History  Diagnosis Date  . Esophagitis   . Colon polyps   . History of bladder cancer   . Esophagus, Barrett's   . Stenosis of esophagus   . Localized osteoarthrosis, lower leg   . Malaise and fatigue   . Hypothyroidism   . Hypertension   . Anemia   . B12 deficiency anemia   . GERD (gastroesophageal reflux disease)   . Hyperlipidemia   . Depression   . Allergy   . CAD (coronary artery disease)   . COPD (chronic obstructive pulmonary disease)   . Low back pain   . Blood transfusion without reported diagnosis   . Cancer     bladder cancer 8 times    History   Social History  . Marital Status: Married    Spouse Name: N/A  . Number of Children: N/A  . Years of Education: N/A   Occupational History  . Not on file.   Social History Main Topics  . Smoking status: Former Smoker    Quit date: 03/30/1973  . Smokeless tobacco: Never Used     Comment: quit in 1977  . Alcohol Use: No  . Drug Use: No  . Sexual Activity: Yes   Other Topics Concern  . Not on file   Social History Narrative    Past Surgical History  Procedure Laterality Date  . Coronary artery bypass graft      x4  . Cervical discectomy      ACDF  . Lumbar laminectomy      and fusion  . Bladder cancer    . Popliteal synovial cyst excision    . Knee arthroscopy    . Esophagogastroduodenoscopy  04/29/2010  . Colonoscopy  11/17/2005    normal     Family History  Problem Relation Age of Onset  . Coronary artery disease    . Cancer Mother     melanoma  . Stroke Father   . Hypertension Father   . Colon cancer Neg Hx   . Esophageal cancer Neg Hx   . Stomach cancer Neg Hx   . Rectal cancer Neg Hx     No Known Allergies  Current Outpatient Prescriptions on File Prior to Visit  Medication Sig Dispense Refill  . aspirin 81 MG tablet Take 81 mg by mouth daily.      [name] BYSTOLIC 10 MG tablet TAKE ONE-HALF TABLET BY MOUTH ONCE DAILY 45 tablet 1  . esomeprazole (NEXIUM) 40 MG capsule Take 1 capsule (40 mg total) by mouth 2 (two) times daily. 30 capsule 11  . etodolac (LODINE) 300 MG capsule TAKE ONE CAPSULE  BY MOUTH EVERY 8 HOURS 180 capsule 1  . ezetimibe-simvastatin (VYTORIN) 10-40 MG per tablet Take 1 tablet by mouth daily. 90 tablet 3  . folic acid (FOLVITE) 622 MCG tablet Take 400 mcg by mouth daily.      [name] gabapentin (NEURONTIN) 300 MG capsule Take 1 capsule by mouth 2 (two) times daily.    . isosorbide mononitrate (IMDUR) 60 MG 24 hr tablet TAKE ONE TABLET BY MOUTH DAILY. 180 tablet 1  . losartan (COZAAR) 50 MG tablet TAKE ONE TABLET BY MOUTH ONCE DAILY 90 tablet 1  . meclizine (ANTIVERT) 25 MG tablet TAKE 1 TABLET BY MOUTH 3 TIMES DAILY AS NEEDED FOR DIZZINESS. 30 tablet 0  . nitroGLYCERIN (NITROSTAT) 0.4 MG SL tablet Place 1 tablet (0.4 mg total) under the tongue every 5 (five) minutes as needed for chest pain. 30 tablet 3  . oxyCODONE-acetaminophen (PERCOCET) 10-325 MG per tablet Take 1 tablet by mouth 2 (two) times daily as needed. 60 tablet 0  . rOPINIRole (REQUIP) 2 MG tablet TAKE ONE TABLET BY MOUTH AT BEDTIME 90 tablet 1  . venlafaxine (EFFEXOR) 75 MG tablet TAKE ONE TABLET BY MOUTH  DAILY. 90 tablet 3   No current facility-administered medications on file prior to visit.    There were no vitals taken for this visit.   1. Risk factors, based on past  M,S,F history patient has known coronary artery disease.  He has dyslipidemia and hypertension-   2.  Physical activities:Limited due to orthopedic complaints and especially leg pain and peripheral neuropathy and osteoarthritis.  He also complains of left shoulder pain.  He states left knee pain is now lifestyle limiting  3.  Depression/mood:History depression, stable on medication   4.  Hearing: Moderate  deficits   5.  ADL's:Independent in all aspects of daily living   6.  Fall risk:Slightly increased due to peripheral neuropathy ; has follow once over the past year without injury  7.  Home safety:No problems identified   8.  Height weight, and visual acuity;Height and weight stable.  No change in visual acuity .  No recent eye examination  9.  Counseling:Heart healthy diet.  Encouraged ongoing weight loss recommended   10. Lab orders based on risk factors:Recent laboratory studies reviewed   11. Referral :Followup GI for upper endoscopy.  Followup urology and cardiology .  He will be referred to orthopedics due to worsening left shoulder and left knee pain  12. Care plan:Continue aggressive risk factor modification   13. Cognitive assessment: Alert and oriented with normal affect.  No cognitive dysfunction  14.  Preventive services will include periodic upper endoscopies due to his Barrett's esophagus.  He will need follow-up colonoscopy at 10 year intervals.  This be scheduled in October 2017.  He is followed by urology on a every six-month interval and periodically by cardiology.  He'll be seen at least annually by primary care.  15.  Provider list update includes primary care medicine GI and urology as well as cardiology follow-up with ophthalmology encouraged      Review of Systems  Constitutional:  Negative for fever, chills, activity change, appetite change and fatigue.  HENT: Negative for congestion, dental problem, ear pain, hearing loss, mouth sores, rhinorrhea, sinus pressure, sneezing, tinnitus, trouble swallowing and voice change.   Eyes: Negative for photophobia, pain, redness and visual disturbance.  Respiratory: Negative for apnea, cough, choking, chest tightness, shortness of breath and wheezing.   Cardiovascular: Negative for chest pain, palpitations and leg  swelling.  Gastrointestinal: Negative for nausea, vomiting, abdominal pain, diarrhea, constipation, blood in stool, abdominal distention, anal bleeding and rectal pain.  Genitourinary: Negative for dysuria, urgency, frequency, hematuria, flank pain, decreased urine volume, discharge, penile swelling, scrotal swelling, difficulty urinating, genital sores and testicular pain.  Musculoskeletal: Positive for back pain, arthralgias, gait problem and neck stiffness. Negative for myalgias, joint swelling and neck pain.  Skin: Negative for color change, rash and wound.  Neurological: Negative for dizziness, tremors, seizures, syncope, facial asymmetry, speech difficulty, weakness, light-headedness, numbness and headaches.  Hematological: Negative for adenopathy. Does not bruise/bleed easily.  Psychiatric/Behavioral: Negative for suicidal ideas, hallucinations, behavioral problems, confusion, sleep disturbance, self-injury, dysphoric mood, decreased concentration and agitation. The patient is not nervous/anxious.        Objective:   Physical Exam  Constitutional: He appears well-developed and well-nourished.  HENT:  Head: Normocephalic and atraumatic.  Right Ear: External ear normal.  Left Ear: External ear normal.  Nose: Nose normal.  Mouth/Throat: Oropharynx is clear and moist.  Upper dentures in place  Eyes: Conjunctivae and EOM are normal. Pupils are equal, round, and reactive to light. No scleral icterus.  Neck: Normal  range of motion. Neck supple. No JVD present. No thyromegaly present.  Surgical scar left anterior neck  Cardiovascular: Regular rhythm and normal heart sounds.  Exam reveals no gallop and no friction rub.   No murmur heard. Dorsalis pedis pulses intact.  Posterior tibial pulses not easily palpable  Pulmonary/Chest: Effort normal and breath sounds normal. He exhibits no tenderness.  Status post sternotomy  Abdominal: Soft. Bowel sounds are normal. He exhibits no distension and no mass. There is no tenderness.  Genitourinary: Penis normal.  Prosthesis in place  Musculoskeletal: Normal range of motion. He exhibits no edema or tenderness.  Lymphadenopathy:    He has no cervical adenopathy.  Neurological: He is alert. He has normal reflexes. No cranial nerve deficit. Coordination normal.  Absent Achilles reflexes Decreased vibratory sensation distal legs, more marked left foot  Skin: Skin is warm and dry. No rash noted.  Surgical scars, left knee, and right elbow  Psychiatric: He has a normal mood and affect. His behavior is normal.          Assessment & Plan:  Preventive health examination Coronary artery disease.  Followup cardiology Dyslipidemia.  Continue statin therapy Hypothyroidism Hypertension Depression stable Chronic pain syndrome.  Analgesics refilled Peripheral neuropathy, history of restless leg syndrome Barrett's esophagus.  Followup GI History of bladder cancer.  Followup urology Worsening left shoulder left knee pain.  Will set up orthopedic referral per patient request

## 2014-06-06 NOTE — Patient Instructions (Addendum)
Limit your sodium (Salt) intake  Orthopedic consultation as discussed    It is important that you exercise regularly, at least 20 minutes 3 to 4 times per week.  If you develop chest pain or shortness of breath seek  medical attention.  GI and urology follow-up as scheduled  See your eye doctor annually  Return in one year for follow-up   Health Maintenance A healthy lifestyle and preventative care can promote health and wellness.  Maintain regular health, dental, and eye exams.  Eat a healthy diet. Foods like vegetables, fruits, whole grains, low-fat dairy products, and lean protein foods contain the nutrients you need and are low in calories. Decrease your intake of foods high in solid fats, added sugars, and salt. Get information about a proper diet from your health care provider, if necessary.  Regular physical exercise is one of the most important things you can do for your health. Most adults should get at least 150 minutes of moderate-intensity exercise (any activity that increases your heart rate and causes you to sweat) each week. In addition, most adults need muscle-strengthening exercises on 2 or more days a week.   Maintain a healthy weight. The body mass index (BMI) is a screening tool to identify possible weight problems. It provides an estimate of body fat based on height and weight. Your health care provider can find your BMI and can help you achieve or maintain a healthy weight. For males 20 years and older:  A BMI below 18.5 is considered underweight.  A BMI of 18.5 to 24.9 is normal.  A BMI of 25 to 29.9 is considered overweight.  A BMI of 30 and above is considered obese.  Maintain normal blood lipids and cholesterol by exercising and minimizing your intake of saturated fat. Eat a balanced diet with plenty of fruits and vegetables. Blood tests for lipids and cholesterol should begin at age 46 and be repeated every 5 years. If your lipid or cholesterol levels are  high, you are over age 68, or you are at high risk for heart disease, you may need your cholesterol levels checked more frequently.Ongoing high lipid and cholesterol levels should be treated with medicines if diet and exercise are not working.  If you smoke, find out from your health care provider how to quit. If you do not use tobacco, do not start.  Lung cancer screening is recommended for adults aged 31-80 years who are at high risk for developing lung cancer because of a history of smoking. A yearly low-dose CT scan of the lungs is recommended for people who have at least a 30-pack-year history of smoking and are current smokers or have quit within the past 15 years. A pack year of smoking is smoking an average of 1 pack of cigarettes a day for 1 year (for example, a 30-pack-year history of smoking could mean smoking 1 pack a day for 30 years or 2 packs a day for 15 years). Yearly screening should continue until the smoker has stopped smoking for at least 15 years. Yearly screening should be stopped for people who develop a health problem that would prevent them from having lung cancer treatment.  If you choose to drink alcohol, do not have more than 2 drinks per day. One drink is considered to be 12 oz (360 mL) of beer, 5 oz (150 mL) of wine, or 1.5 oz (45 mL) of liquor.  Avoid the use of street drugs. Do not share needles with anyone. Ask for  help if you need support or instructions about stopping the use of drugs.  High blood pressure causes heart disease and increases the risk of stroke. Blood pressure should be checked at least every 1-2 years. Ongoing high blood pressure should be treated with medicines if weight loss and exercise are not effective.  If you are 35-68 years old, ask your health care provider if you should take aspirin to prevent heart disease.  Diabetes screening involves taking a blood sample to check your fasting blood sugar level. This should be done once every 3 years  after age 56 if you are at a normal weight and without risk factors for diabetes. Testing should be considered at a younger age or be carried out more frequently if you are overweight and have at least 1 risk factor for diabetes.  Colorectal cancer can be detected and often prevented. Most routine colorectal cancer screening begins at the age of 1 and continues through age 33. However, your health care provider may recommend screening at an earlier age if you have risk factors for colon cancer. On a yearly basis, your health care provider may provide home test kits to check for hidden blood in the stool. A small camera at the end of a tube may be used to directly examine the colon (sigmoidoscopy or colonoscopy) to detect the earliest forms of colorectal cancer. Talk to your health care provider about this at age 27 when routine screening begins. A direct exam of the colon should be repeated every 5-10 years through age 69, unless early forms of precancerous polyps or small growths are found.  People who are at an increased risk for hepatitis B should be screened for this virus. You are considered at high risk for hepatitis B if:  You were born in a country where hepatitis B occurs often. Talk with your health care provider about which countries are considered high risk.  Your parents were born in a high-risk country and you have not received a shot to protect against hepatitis B (hepatitis B vaccine).  You have HIV or AIDS.  You use needles to inject street drugs.  You live with, or have sex with, someone who has hepatitis B.  You are a man who has sex with other men (MSM).  You get hemodialysis treatment.  You take certain medicines for conditions like cancer, organ transplantation, and autoimmune conditions.  Hepatitis C blood testing is recommended for all people born from [birth year] through 1963 and any individual with known risk factors for hepatitis C.  Healthy men should no longer receive  prostate-specific antigen (PSA) blood tests as part of routine cancer screening. Talk to your health care provider about prostate cancer screening.  Testicular cancer screening is not recommended for adolescents or adult males who have no symptoms. Screening includes self-exam, a health care provider exam, and other screening tests. Consult with your health care provider about any symptoms you have or any concerns you have about testicular cancer.  Practice safe sex. Use condoms and avoid high-risk sexual practices to reduce the spread of sexually transmitted infections (STIs).  You should be screened for STIs, including gonorrhea and chlamydia if:  You are sexually active and are younger than 24 years.  You are older than 24 years, and your health care provider tells you that you are at risk for this type of infection.  Your sexual activity has changed since you were last screened, and you are at an increased risk for chlamydia or gonorrhea.  care provider if you are at risk.  If you are at risk of being infected with HIV, it is recommended that you take a prescription medicine daily to prevent HIV infection. This is called pre-exposure prophylaxis (PrEP). You are considered at risk if:  You are a man who has sex with other men (MSM).  You are a heterosexual man who is sexually active with multiple partners.  You take drugs by injection.  You are sexually active with a partner who has HIV.  Talk with your health care provider about whether you are at high risk of being infected with HIV. If you choose to begin PrEP, you should first be tested for HIV. You should then be tested every 3 months for as long as you are taking PrEP.  Use sunscreen. Apply sunscreen liberally and repeatedly throughout the day. You should seek shade when your shadow is shorter than you. Protect yourself by wearing long sleeves, pants, a wide-brimmed hat, and sunglasses year round whenever you are outdoors.  Tell  your health care provider of new moles or changes in moles, especially if there is a change in shape or color. Also, tell your health care provider if a mole is larger than the size of a pencil eraser.  A one-time screening for abdominal aortic aneurysm (AAA) and surgical repair of large AAAs by ultrasound is recommended for men aged 65-75 years who are current or former smokers.  Stay current with your vaccines (immunizations). Document Released: 07/04/2007 Document Revised: 01/10/2013 Document Reviewed: 06/02/2010 [employer] Patient Information 2015 [employer]. This information is not intended to replace advice given to you by your health care provider. Make sure you discuss any questions you have with your health care provider.  

## 2014-06-15 ENCOUNTER — Other Ambulatory Visit: Payer: Self-pay | Admitting: Family Medicine

## 2014-06-19 ENCOUNTER — Telehealth: Payer: Self-pay | Admitting: *Deleted

## 2014-06-19 DIAGNOSIS — Z79899 Other long term (current) drug therapy: Secondary | ICD-10-CM

## 2014-06-19 DIAGNOSIS — R899 Unspecified abnormal finding in specimens from other organs, systems and tissues: Secondary | ICD-10-CM | POA: Diagnosis not present

## 2014-06-19 NOTE — Telephone Encounter (Signed)
-----   Message from Troy Sine, MD sent at 06/18/2014  9:32 PM EDT ----- Re check BMET; avoid foods with K;

## 2014-06-19 NOTE — Telephone Encounter (Signed)
Patient notified of lab results and recommendations. Order for B-MET placed. Patient will go this week to have done. 

## 2014-06-21 DIAGNOSIS — M25512 Pain in left shoulder: Secondary | ICD-10-CM | POA: Diagnosis not present

## 2014-06-21 DIAGNOSIS — M25562 Pain in left knee: Secondary | ICD-10-CM | POA: Diagnosis not present

## 2014-06-21 DIAGNOSIS — M25561 Pain in right knee: Secondary | ICD-10-CM | POA: Diagnosis not present

## 2014-06-21 LAB — BASIC METABOLIC PANEL
BUN: 31 mg/dL — AB (ref 6–23)
CO2: 28 mEq/L (ref 19–32)
Calcium: 9.1 mg/dL (ref 8.4–10.5)
Chloride: 106 mEq/L (ref 96–112)
Creat: 1.29 mg/dL (ref 0.50–1.35)
Glucose, Bld: 111 mg/dL — ABNORMAL HIGH (ref 70–99)
POTASSIUM: 5.9 meq/L — AB (ref 3.5–5.3)
Sodium: 142 mEq/L (ref 135–145)

## 2014-06-22 ENCOUNTER — Other Ambulatory Visit: Payer: Self-pay

## 2014-06-22 DIAGNOSIS — E875 Hyperkalemia: Secondary | ICD-10-CM

## 2014-06-28 DIAGNOSIS — E875 Hyperkalemia: Secondary | ICD-10-CM | POA: Diagnosis not present

## 2014-06-29 DIAGNOSIS — M25512 Pain in left shoulder: Secondary | ICD-10-CM | POA: Diagnosis not present

## 2014-06-29 LAB — BASIC METABOLIC PANEL
BUN: 28 mg/dL — ABNORMAL HIGH (ref 6–23)
CO2: 29 mEq/L (ref 19–32)
Calcium: 9 mg/dL (ref 8.4–10.5)
Chloride: 105 mEq/L (ref 96–112)
Creat: 1.31 mg/dL (ref 0.50–1.35)
GLUCOSE: 143 mg/dL — AB (ref 70–99)
Potassium: 5 mEq/L (ref 3.5–5.3)
SODIUM: 140 meq/L (ref 135–145)

## 2014-07-03 ENCOUNTER — Encounter: Payer: Self-pay | Admitting: *Deleted

## 2014-07-20 ENCOUNTER — Ambulatory Visit (INDEPENDENT_AMBULATORY_CARE_PROVIDER_SITE_OTHER): Payer: Medicare Other | Admitting: *Deleted

## 2014-07-20 ENCOUNTER — Other Ambulatory Visit: Payer: Self-pay | Admitting: Internal Medicine

## 2014-07-20 DIAGNOSIS — D518 Other vitamin B12 deficiency anemias: Secondary | ICD-10-CM | POA: Diagnosis not present

## 2014-07-20 MED ORDER — OXYCODONE-ACETAMINOPHEN 10-325 MG PO TABS: 1.0000 | ORAL_TABLET | Freq: Two times a day (BID) | ORAL | Status: DC | PRN

## 2014-07-20 MED ORDER — CYANOCOBALAMIN 1000 MCG/ML IJ SOLN
1000.0000 ug | Freq: Once | INTRAMUSCULAR | Status: AC
  Administered 2014-07-20: 1000 ug via INTRAMUSCULAR

## 2014-07-20 NOTE — Telephone Encounter (Signed)
done

## 2014-07-20 NOTE — Telephone Encounter (Signed)
Pt has an appt at 1145 am for b12 injection. Pt would like to pick up percocet rx today.

## 2014-07-28 ENCOUNTER — Other Ambulatory Visit: Payer: Self-pay | Admitting: Internal Medicine

## 2014-08-27 ENCOUNTER — Telehealth: Payer: Self-pay | Admitting: Internal Medicine

## 2014-08-27 ENCOUNTER — Ambulatory Visit (INDEPENDENT_AMBULATORY_CARE_PROVIDER_SITE_OTHER): Payer: Medicare Other | Admitting: *Deleted

## 2014-08-27 DIAGNOSIS — D518 Other vitamin B12 deficiency anemias: Secondary | ICD-10-CM

## 2014-08-27 MED ORDER — CYANOCOBALAMIN 1000 MCG/ML IJ SOLN
1000.0000 ug | Freq: Once | INTRAMUSCULAR | Status: AC
  Administered 2014-08-27: 1000 ug via INTRAMUSCULAR

## 2014-08-27 MED ORDER — OXYCODONE-ACETAMINOPHEN 10-325 MG PO TABS: 1.0000 | ORAL_TABLET | Freq: Two times a day (BID) | ORAL | Status: DC | PRN

## 2014-08-27 NOTE — Telephone Encounter (Signed)
Pt here for injection and picked up Rx for Oxycodone. Rx printed and signed.

## 2014-08-27 NOTE — Telephone Encounter (Signed)
Pt request refill of the following: oxyCODONE-acetaminophen (PERCOCET) 10-325 MG per tablet   Phamacy:

## 2014-09-14 ENCOUNTER — Other Ambulatory Visit: Payer: Self-pay | Admitting: Internal Medicine

## 2014-09-28 ENCOUNTER — Telehealth: Payer: Self-pay | Admitting: Internal Medicine

## 2014-09-28 ENCOUNTER — Ambulatory Visit (INDEPENDENT_AMBULATORY_CARE_PROVIDER_SITE_OTHER): Payer: Medicare Other | Admitting: *Deleted

## 2014-09-28 DIAGNOSIS — D518 Other vitamin B12 deficiency anemias: Secondary | ICD-10-CM | POA: Diagnosis not present

## 2014-09-28 MED ORDER — CYANOCOBALAMIN 1000 MCG/ML IJ SOLN
1000.0000 ug | Freq: Once | INTRAMUSCULAR | Status: AC
  Administered 2014-09-28: 1000 ug via INTRAMUSCULAR

## 2014-09-28 MED ORDER — OXYCODONE-ACETAMINOPHEN 10-325 MG PO TABS: 1.0000 | ORAL_TABLET | Freq: Two times a day (BID) | ORAL | Status: DC | PRN

## 2014-09-28 NOTE — Telephone Encounter (Signed)
Pt request refill of the following  oxyCODONE-acetaminophen (PERCOCET) 10-325 MG per tablet ° ° °Phamacy: ° °

## 2014-09-28 NOTE — Telephone Encounter (Signed)
Pt notified Rx ready for pickup. Rx printed and signed.  

## 2014-10-16 ENCOUNTER — Other Ambulatory Visit: Payer: Self-pay | Admitting: Internal Medicine

## 2014-10-22 ENCOUNTER — Encounter: Payer: Self-pay | Admitting: Internal Medicine

## 2014-10-22 ENCOUNTER — Ambulatory Visit (INDEPENDENT_AMBULATORY_CARE_PROVIDER_SITE_OTHER): Payer: Medicare Other | Admitting: Internal Medicine

## 2014-10-22 VITALS — BP 116/70 | HR 64 | Temp 98.4°F | Resp 20 | Ht 67.75 in | Wt 194.0 lb

## 2014-10-22 DIAGNOSIS — I499 Cardiac arrhythmia, unspecified: Secondary | ICD-10-CM

## 2014-10-22 DIAGNOSIS — I2510 Atherosclerotic heart disease of native coronary artery without angina pectoris: Secondary | ICD-10-CM | POA: Diagnosis not present

## 2014-10-22 DIAGNOSIS — Z23 Encounter for immunization: Secondary | ICD-10-CM

## 2014-10-22 DIAGNOSIS — E785 Hyperlipidemia, unspecified: Secondary | ICD-10-CM

## 2014-10-22 DIAGNOSIS — E039 Hypothyroidism, unspecified: Secondary | ICD-10-CM | POA: Diagnosis not present

## 2014-10-22 MED ORDER — VENLAFAXINE HCL 75 MG PO TABS: 75.0000 mg | ORAL_TABLET | Freq: Every day | ORAL | Status: DC

## 2014-10-22 NOTE — Progress Notes (Signed)
Subjective:    Patient ID: [name], male    DOB: 1940/10/21, 74 y.o.   MRN: [id number]  HPI  75 year old patient who is seen today for his biannual follow-up.  He continues to have fairly diffuse pain.  He is on chronic analgesics.  Last spring he was seen by orthopedics due to worsening left knee and left shoulder pain. He is scheduled to see cardiology for a nuclear stress test later this fall.  .  Otherwise, he has done well.  Denies any cardiopulmonary complaints.  He does have essential hypertension and B12 deficiency.  He has COPD and Barrett's esophagus.  Past Medical History  Diagnosis Date  . Esophagitis   . Colon polyps   . History of bladder cancer   . Esophagus, Barrett's   . Stenosis of esophagus   . Localized osteoarthrosis, lower leg   . Malaise and fatigue   . Hypothyroidism   . Hypertension   . Anemia   . B12 deficiency anemia   . GERD (gastroesophageal reflux disease)   . Hyperlipidemia   . Depression   . Allergy   . CAD (coronary artery disease)   . COPD (chronic obstructive pulmonary disease) ([place])   . Low back pain   . Blood transfusion without reported diagnosis   . Cancer [facility])     bladder cancer 8 times    Social History   Social History  . Marital Status: Married    Spouse Name: N/A  . Number of Children: N/A  . Years of Education: N/A   Occupational History  . Not on file.   Social History Main Topics  . Smoking status: Former Smoker    Quit date: 03/30/1976  . Smokeless tobacco: Never Used     Comment: quit in 1978  . Alcohol Use: No  . Drug Use: No  . Sexual Activity: Yes   Other Topics Concern  . Not on file   Social History Narrative    Past Surgical History  Procedure Laterality Date  . Coronary artery bypass graft      x4  . Cervical discectomy      ACDF  . Lumbar laminectomy      and fusion  . Bladder cancer    . Popliteal synovial cyst excision    . Knee arthroscopy    . Esophagogastroduodenoscopy   04/29/2010  . Colonoscopy  11/17/2005    normal     Family History  Problem Relation Age of Onset  . Coronary artery disease    . Cancer Mother     melanoma  . Stroke Father   . Hypertension Father   . Colon cancer Neg Hx   . Esophageal cancer Neg Hx   . Stomach cancer Neg Hx   . Rectal cancer Neg Hx     No Known Allergies  Current Outpatient Prescriptions on File Prior to Visit  Medication Sig Dispense Refill  . aspirin 81 MG tablet Take 81 mg by mouth daily.      [name] BYSTOLIC 10 MG tablet TAKE ONE-HALF TABLET BY MOUTH ONCE DAILY 45 tablet 1  . esomeprazole (NEXIUM) 40 MG capsule Take 1 capsule (40 mg total) by mouth 2 (two) times daily. 30 capsule 11  . etodolac (LODINE) 300 MG capsule TAKE ONE CAPSULE BY MOUTH EVERY 8 HOURS 180 capsule 1  . ezetimibe-simvastatin (VYTORIN) 10-40 MG per tablet Take 1 tablet by mouth daily. 90 tablet 3  . folic acid (FOLVITE) 202 MCG tablet Take  400 mcg by mouth daily.      [name] gabapentin (NEURONTIN) 300 MG capsule Take 1 capsule by mouth 2 (two) times daily.    . isosorbide mononitrate (IMDUR) 60 MG 24 hr tablet TAKE ONE TABLET BY MOUTH DAILY. 180 tablet 1  . losartan (COZAAR) 50 MG tablet TAKE ONE TABLET BY MOUTH ONCE DAILY 90 tablet 1  . meclizine (ANTIVERT) 25 MG tablet TAKE ONE TABLET BY MOUTH THREE TIMES DAILY AS NEEDED FOR DIZZINESS 30 tablet 2  . nitroGLYCERIN (NITROSTAT) 0.4 MG SL tablet Place 1 tablet (0.4 mg total) under the tongue every 5 (five) minutes as needed for chest pain. 30 tablet 3  . oxyCODONE-acetaminophen (PERCOCET) 10-325 MG per tablet Take 1 tablet by mouth 2 (two) times daily as needed. 60 tablet 0  . rOPINIRole (REQUIP) 2 MG tablet TAKE ONE TABLET BY MOUTH AT BEDTIME 90 tablet 1  . rOPINIRole (REQUIP) 2 MG tablet TAKE ONE TABLET BY MOUTH AT BEDTIME 90 tablet 1  . venlafaxine (EFFEXOR) 75 MG tablet TAKE ONE TABLET BY MOUTH DAILY. 90 tablet 3  . VYTORIN 10-40 MG tablet TAKE ONE TABLET BY MOUTH AT BEDTIME 30 tablet 5   No  current facility-administered medications on file prior to visit.    BP 116/70 mmHg  Pulse 64  Temp(Src) 98.4 F (36.9 C) (Oral)  Resp 20  Ht 5' 7.75" (1.721 m)  Wt 194 lb (87.998 kg)  BMI 29.71 kg/m2  SpO2 99%      Review of Systems  Constitutional: Negative for fever, chills, appetite change and fatigue.  HENT: Negative for congestion, dental problem, ear pain, hearing loss, sore throat, tinnitus, trouble swallowing and voice change.   Eyes: Negative for pain, discharge and visual disturbance.  Respiratory: Negative for cough, chest tightness, wheezing and stridor.   Cardiovascular: Negative for chest pain, palpitations and leg swelling.  Gastrointestinal: Negative for nausea, vomiting, abdominal pain, diarrhea, constipation, blood in stool and abdominal distention.  Genitourinary: Negative for urgency, hematuria, flank pain, discharge, difficulty urinating and genital sores.  Musculoskeletal: Positive for back pain and arthralgias. Negative for myalgias, joint swelling, gait problem and neck stiffness.  Skin: Negative for rash.  Neurological: Negative for dizziness, syncope, speech difficulty, weakness, numbness and headaches.  Hematological: Negative for adenopathy. Does not bruise/bleed easily.  Psychiatric/Behavioral: Negative for behavioral problems and dysphoric mood. The patient is not nervous/anxious.        Objective:   Physical Exam  Constitutional: He is oriented to person, place, and time. He appears well-developed.  HENT:  Head: Normocephalic.  Right Ear: External ear normal.  Left Ear: External ear normal.  Eyes: Conjunctivae and EOM are normal.  Neck: Normal range of motion.  Cardiovascular: Normal rate and normal heart sounds.   Frequent ectopics  Pulmonary/Chest: Breath sounds normal.  Abdominal: Bowel sounds are normal.  Musculoskeletal: Normal range of motion. He exhibits no edema or tenderness.  Neurological: He is alert and oriented to person,  place, and time.  Psychiatric: He has a normal mood and affect. His behavior is normal.          Assessment & Plan:  Coronary artery disease Cardiac dysrhythmia.  Will check an EKG to rule out A. Fib Essential hypertension, stable Osteoarthritis.  Continue analgesics.  Follow-up orthopedics  Follow-up cardiology Recheck here 6 months

## 2014-10-22 NOTE — Patient Instructions (Signed)
Cardiology follow-up as scheduled  Limit your sodium (Salt) intake    It is important that you exercise regularly, at least 20 minutes 3 to 4 times per week.  If you develop chest pain or shortness of breath seek  medical attention.  Return in 6 months for follow-up     

## 2014-10-22 NOTE — Progress Notes (Signed)
Pre visit review using our clinic review tool, if applicable. No additional management support is needed unless otherwise documented below in the visit note. 

## 2014-11-02 DIAGNOSIS — N359 Urethral stricture, unspecified: Secondary | ICD-10-CM | POA: Diagnosis not present

## 2014-11-02 DIAGNOSIS — Z8551 Personal history of malignant neoplasm of bladder: Secondary | ICD-10-CM | POA: Diagnosis not present

## 2014-11-05 ENCOUNTER — Ambulatory Visit (INDEPENDENT_AMBULATORY_CARE_PROVIDER_SITE_OTHER): Payer: Medicare Other | Admitting: *Deleted

## 2014-11-05 ENCOUNTER — Telehealth: Payer: Self-pay | Admitting: Internal Medicine

## 2014-11-05 DIAGNOSIS — D518 Other vitamin B12 deficiency anemias: Secondary | ICD-10-CM | POA: Diagnosis not present

## 2014-11-05 MED ORDER — OXYCODONE-ACETAMINOPHEN 10-325 MG PO TABS: 1.0000 | ORAL_TABLET | Freq: Two times a day (BID) | ORAL | Status: DC | PRN

## 2014-11-05 MED ORDER — CYANOCOBALAMIN 1000 MCG/ML IJ SOLN
1000.0000 ug | Freq: Once | INTRAMUSCULAR | Status: AC
  Administered 2014-11-05: 1000 ug via INTRAMUSCULAR

## 2014-11-05 NOTE — Telephone Encounter (Signed)
Pt needs new rx oxycodone °

## 2014-11-05 NOTE — Telephone Encounter (Signed)
Left detailed message on voicemail Rx ready for pickup, will be at the front desk. Rx printed and signed.  

## 2014-11-08 ENCOUNTER — Ambulatory Visit: Payer: Medicare Other | Admitting: *Deleted

## 2014-11-16 ENCOUNTER — Encounter: Payer: Self-pay | Admitting: Cardiovascular Disease

## 2014-11-16 ENCOUNTER — Ambulatory Visit (INDEPENDENT_AMBULATORY_CARE_PROVIDER_SITE_OTHER): Payer: Medicare Other | Admitting: Cardiovascular Disease

## 2014-11-16 VITALS — BP 140/78 | HR 61 | Ht 69.0 in | Wt 192.0 lb

## 2014-11-16 DIAGNOSIS — I25810 Atherosclerosis of coronary artery bypass graft(s) without angina pectoris: Secondary | ICD-10-CM

## 2014-11-16 DIAGNOSIS — E785 Hyperlipidemia, unspecified: Secondary | ICD-10-CM

## 2014-11-16 DIAGNOSIS — Z79899 Other long term (current) drug therapy: Secondary | ICD-10-CM

## 2014-11-16 DIAGNOSIS — K219 Gastro-esophageal reflux disease without esophagitis: Secondary | ICD-10-CM | POA: Diagnosis not present

## 2014-11-16 DIAGNOSIS — I10 Essential (primary) hypertension: Secondary | ICD-10-CM

## 2014-11-16 DIAGNOSIS — I2510 Atherosclerotic heart disease of native coronary artery without angina pectoris: Secondary | ICD-10-CM

## 2014-11-16 MED ORDER — EZETIMIBE-SIMVASTATIN 10-20 MG PO TABS: 1.0000 | ORAL_TABLET | Freq: Every day | ORAL | Status: DC

## 2014-11-16 NOTE — Patient Instructions (Signed)
Your physician has recommended you make the following change in your medication: the vytorin has been changed from 10/40 to 10/20. A new prescription has been sent to your pharmacy.  Your physician recommends that you return for lab work in: 2 months.  Your physician has requested that you have a lexiscan myoview. For further information please visit [email or web address]. Please follow instruction sheet, as given.  Your physician wants you to follow-up in: 6 months or sooner if needed. You will receive a reminder letter in the mail two months in advance. If you don't receive a letter, please call our office to schedule the follow-up appointment.  If you need a refill on your cardiac medications before your next appointment, please call your pharmacy.

## 2014-11-16 NOTE — Progress Notes (Signed)
Patient ID: [name], male   DOB: 1940-02-14, 74 y.o.   MRN: [id number]      HPI: [name] is a 74 y.o. male who presents to the office today for a 6 month cardiology followup evaluation.  [name] has known CAD and underwent CABG revascularization surgery in 2001 with a LIMA to his LAD, vein to the marginal, vein to the RCA. Catheterization in 2004 showed patent grafts. An echo Doppler study in August 2012 revealed an ejection fraction of 40-45% with moderate septal hypokinesis with grade 1 diastolic dysfunction as well as aortic valve sclerosis. Additional problems include GERD, mild emphysema, as well as obstructive sleep apnea. He no longer utilizes CPAP. He has lost approximately 25 pounds since his wife's death from a motor vehicle accident in January 2013.  He remains active and notes mild shortness of breath with activity particularly when walking up hills but this has not changed.  He also is a history of restless leg syndrome and has benefited with requip. Laboratory in 2013 demonstrated LDL 57 with LDL particle #1035, small LDL particle number was 611, slightly increased, HDLC was 35 triglycerides 93 total cholesterol 111 initial resistance course slightly elevated at 53.  BP blood work in March 2015 revealed normal renal function.  Fasting glucose was minimally increased at 106.  Total cholesterol was 116 triglycerides 80, and LDL 66, with an HDL of 34.  LDL particle number was slightly increased at 1190.  His last myocardial perfusion study was in August 2012. 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  He admits to arthralgias without myalgias.  He has issues with his in his legs and his peripheral neuropathy.  He also notes some left shoulder discomfort.  When I last saw him 6 months ago, commended that he undergo a four-year follow-up nuclear perfusion study.  Apparently this is scheduled for over the next  several weeks, but has not been done prior to this office visit.  He denies episodes of chest pain.  He denies PND, orthopnea.  He presents for evaluation.  Past Medical History  Diagnosis Date  . Esophagitis   . Colon polyps   . History of bladder cancer   . Esophagus, Barrett's   . Stenosis of esophagus   . Localized osteoarthrosis, lower leg   . Malaise and fatigue   . Hypothyroidism   . Hypertension   . Anemia   . B12 deficiency anemia   . GERD (gastroesophageal reflux disease)   . Hyperlipidemia   . Depression   . Allergy   . CAD (coronary artery disease)   . COPD (chronic obstructive pulmonary disease) ([place])   . Low back pain   . Blood transfusion without reported diagnosis   . Cancer [facility])     bladder cancer 8 times    Past Surgical History  Procedure Laterality Date  . Coronary artery bypass graft      x4  . Cervical discectomy      ACDF  . Lumbar laminectomy      and fusion  . Bladder cancer    . Popliteal synovial cyst excision    . Knee arthroscopy    . Esophagogastroduodenoscopy  04/29/2010  . Colonoscopy  11/17/2005    normal     Allergies  Allergen Reactions  . Adhesive [Tape]     Itching, reddened skin    Current Outpatient Prescriptions  Medication Sig Dispense Refill  .  aspirin 81 MG tablet Take 81 mg by mouth daily.      [name] BYSTOLIC 10 MG tablet TAKE ONE-HALF TABLET BY MOUTH ONCE DAILY 45 tablet 1  . esomeprazole (NEXIUM) 20 MG capsule Take 20 mg by mouth daily.    [name] etodolac (LODINE) 300 MG capsule TAKE ONE CAPSULE BY MOUTH EVERY 8 HOURS 803 capsule 1  . folic acid (FOLVITE) 212 MCG tablet Take 400 mcg by mouth daily.      [name] gabapentin (NEURONTIN) 300 MG capsule Take 1 capsule by mouth 2 (two) times daily.    . isosorbide mononitrate (IMDUR) 60 MG 24 hr tablet TAKE ONE TABLET BY MOUTH DAILY. 180 tablet 1  . losartan (COZAAR) 50 MG tablet TAKE ONE TABLET BY MOUTH ONCE DAILY 90 tablet 1  . meclizine (ANTIVERT) 25 MG tablet TAKE ONE TABLET BY  MOUTH THREE TIMES DAILY AS NEEDED FOR DIZZINESS 30 tablet 2  . nitroGLYCERIN (NITROSTAT) 0.4 MG SL tablet Place 1 tablet (0.4 mg total) under the tongue every 5 (five) minutes as needed for chest pain. 30 tablet 3  . oxyCODONE-acetaminophen (PERCOCET) 10-325 MG tablet Take 1 tablet by mouth 2 (two) times daily as needed. 60 tablet 0  . rOPINIRole (REQUIP) 2 MG tablet TAKE ONE TABLET BY MOUTH AT BEDTIME 90 tablet 1  . venlafaxine (EFFEXOR) 75 MG tablet Take 1 tablet (75 mg total) by mouth daily. 90 tablet 3  . ezetimibe-simvastatin (VYTORIN) 10-20 MG tablet Take 1 tablet by mouth daily. 30 tablet 6   No current facility-administered medications for this visit.   Socially he is widowed in January 2013. He has 3 children 6 grandchildren. 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 There is no recent tobacco use.  no alcohol  ROS General: Negative; No fevers, chills, or night sweats;  HEENT: Negative; No changes in vision or hearing, sinus congestion, difficulty swallowing Pulmonary: Negative; No cough, wheezing, shortness of breath, hemoptysis Cardiovascular: Negative; No chest pain, presyncope, syncope, palpatations.  Minimal shortness of breath with activity GI: GERD; No nausea, vomiting, diarrhea, or abdominal pain GU: Negative; No dysuria, hematuria, or difficulty voiding Musculoskeletal: Positive for multiple joint aches involving his legs, hips, back, and shoulder Hematologic/Oncology: Negative; no easy bruising, bleeding Endocrine: Negative; no heat/cold intolerance; no diabetes Neuro: Negative; no changes in balance, headaches Skin: Negative; No rashes or skin lesions Psychiatric: Negative; No behavioral problems, depression Sleep: He no longer uses CPAP with his 25 pound weight loss.  His restless legs improved with low-dose Requip; No snoring, daytime sleepiness, hypersomnolence, bruxism, hypnogognic  hallucinations, no cataplexy Other comprehensive 14 point system review is negative.   PE BP 140/78 mmHg  Pulse 61  Ht $R'5\' 9"'KE$  (1.753 m)  Wt 192 lb (87.091 kg)  BMI 28.34 kg/m2   Wt Readings from Last 3 Encounters:  11/16/14 192 lb (87.091 kg)  10/22/14 194 lb (87.998 kg)  06/06/14 199 lb (90.266 kg)   General: Alert, oriented, no distress.  HEENT: Normocephalic, atraumatic. Pupils round and reactive; sclera anicteric; Nose without nasal septal hypertrophy Mouth/Parynx benign; Mallinpatti scale 3 Neck: No JVD, no carotid bruits with normal upstroke Lungs: clear to ausculatation and percussion; no wheezing or rales Chest wall: Nontender to palpation Heart: RRR, s1 s2 normal 1/6 systolic murmur.  No S3 or S4 gallop.  No diastolic murmur.. Abdomen: soft, nontender; no hepatosplenomehaly, BS+; abdominal aorta nontender and not dilated by palpation.  Pulses 2+ Extremities: no clubbinbg cyanosis or edema, Homan's sign negative  Neurologic: grossly nonfocal Psychologic: Normal affect and mood.   ECG (independently read by me): Normal sinus rhythm at 61 bpm.  No ectopy.  Normal intervals.  May 2016 ECG (independently read by me): Sinus bradycardia 56 bpm.  No ectopy.  Mild RV conduction delay.  QTc interval 395  ECG (independently read by me): sinus bradycardia 56 bpm.  Mild RV conduction delay.  Borderline first-degree AV block with a PR interval at 208 ms.  No ectopy.  Prior 06/05/2013 ECG (independently read by me): Normal sinus rhythm at 63 beats per minute.  No ectopy; normal intervals.  Prior November 2014 ECG: Sinus rhythm at 68beats per minute. There is mild RV conduction delay. Intervals were normal.  LABS:  BMP Latest Ref Rng 06/28/2014 06/19/2014 05/21/2014  Glucose 70 - 99 mg/dL 143(H) 111(H) 121(H)  BUN 6 - 23 mg/dL 28(H) 31(H) 27(H)  Creatinine 0.50 - 1.35 mg/dL 1.31 1.29 1.32  Sodium 135 - 145 mEq/L 140 142 139  Potassium 3.5 - 5.3 mEq/L 5.0 5.9(H) 5.6(H)  Chloride 96 -  112 mEq/L 105 106 104  CO2 19 - 32 mEq/L $Remove'29 28 30  'HMLefAa$ Calcium 8.4 - 10.5 mg/dL 9.0 9.1 9.6   Hepatic Function Latest Ref Rng 05/21/2014 03/30/2013 04/25/2007  Total Protein 6.0 - 8.3 g/dL 7.4 6.8 7.3  Albumin 3.5 - 5.2 g/dL 4.3 4.1 4.2  AST 0 - 37 U/L $Remo'18 19 14  'nfcMp$ ALT 0 - 53 U/L $Remo'11 14 13  'FiasL$ Alk Phosphatase 39 - 117 U/L 71 66 66  Total Bilirubin 0.2 - 1.2 mg/dL 0.7 0.8 0.4  Bilirubin, Direct 0.0-0.3 mg/dL - - -    CBC Latest Ref Rng 05/21/2014 03/30/2013 12/27/2009  WBC 4.0 - 10.5 K/uL 6.0 6.0 5.1  Hemoglobin 13.0 - 17.0 g/dL 13.4 13.4 12.8(L)  Hematocrit 39.0 - 52.0 % 39.7 40.0 37.6(L)  Platelets 150 - 400 K/uL 144(L) 154 140.0(L)     Lipid Panel     Component Value Date/Time   CHOL 140 05/21/2014 0852   CHOL 116 03/30/2013 1053   TRIG 221* 05/21/2014 0852   TRIG 80 03/30/2013 1053   HDL 30* 05/21/2014 0852   HDL 34* 03/30/2013 1053   CHOLHDL 4.7 05/21/2014 0852   VLDL 44* 05/21/2014 0852   LDLCALC 66 05/21/2014 0852   LDLCALC 66 03/30/2013 1053     RADIOLOGY: No results found.    ASSESSMENT AND PLAN:  Mr. Simonin is a 74 year old Caucasian male who is 15 years status post his CABG revascularization surgery. His last catheterization was in 2004 for recurrent chest pain which continued to show patent grafts. His ejection fraction is 40-45% which was documented on his last echo in August 2012. A myocardial perfusion study at that time showed normal perfusion.  Mr. Schaller major complaints are that of some arthralgias.  He has been taking Vytorin 10/40 for hyperlipidemia.  His last LDL was excellent at 66.  I will reduce his dose to 10/40, and also recommend initiation of coenzyme Q10 to see if this may help some of his symptomatology if this is statin related.  However, it may be due to generalized arthritic issues.  His blood pressure today is controlled on his  medical regimen consisting of losartan 50 mg Bystolic 5 mg in addition to his isosorbide mononitrate 60 mg.  He is not having  anginal symptoms.  He will be undergoing his nuclear perfusion study in November  for surveillance of his CAD 15 years after his prior CABG revascularization surgery.  I will contact him regarding the results.  His GERD remained stable on over-the-counter PPI.    On his reduced dose of Vytorin, I will recheck a lipid panel and, hence, a metabolic panel in 2 months.  As long as he remains stable, I will see him in 6 months for cardiology reevaluation.  Troy Sine, MD, [facility]  11/16/2014 9:06 AM

## 2014-11-22 ENCOUNTER — Encounter (HOSPITAL_COMMUNITY): Payer: Medicare Other

## 2014-11-23 ENCOUNTER — Telehealth (HOSPITAL_COMMUNITY): Payer: Self-pay

## 2014-11-23 NOTE — Telephone Encounter (Signed)
Encounter complete. 

## 2014-11-27 ENCOUNTER — Telehealth (HOSPITAL_COMMUNITY): Payer: Self-pay

## 2014-11-27 NOTE — Telephone Encounter (Signed)
Encounter complete. 

## 2014-11-28 ENCOUNTER — Ambulatory Visit (HOSPITAL_COMMUNITY)
Admission: RE | Admit: 2014-11-28 | Discharge: 2014-11-28 | Disposition: A | Discharge status: Home / Self Care | Payer: Medicare Other | Source: Ambulatory Visit | Attending: Cardiovascular Disease | Admitting: Cardiovascular Disease

## 2014-11-28 DIAGNOSIS — R5383 Other fatigue: Secondary | ICD-10-CM | POA: Diagnosis not present

## 2014-11-28 DIAGNOSIS — Z87891 Personal history of nicotine dependence: Secondary | ICD-10-CM | POA: Insufficient documentation

## 2014-11-28 DIAGNOSIS — I2510 Atherosclerotic heart disease of native coronary artery without angina pectoris: Secondary | ICD-10-CM | POA: Diagnosis not present

## 2014-11-28 DIAGNOSIS — I10 Essential (primary) hypertension: Secondary | ICD-10-CM | POA: Insufficient documentation

## 2014-11-28 DIAGNOSIS — I2583 Coronary atherosclerosis due to lipid rich plaque: Secondary | ICD-10-CM

## 2014-11-28 DIAGNOSIS — R0609 Other forms of dyspnea: Secondary | ICD-10-CM | POA: Insufficient documentation

## 2014-11-28 DIAGNOSIS — Z8249 Family history of ischemic heart disease and other diseases of the circulatory system: Secondary | ICD-10-CM | POA: Insufficient documentation

## 2014-11-28 LAB — MYOCARDIAL PERFUSION IMAGING
CHL CUP NUCLEAR SRS: 3
CHL CUP NUCLEAR SSS: 4
CSEPPHR: 68 {beats}/min
LV dias vol: 116 mL
LVSYSVOL: 53 mL
NUC STRESS TID: 1.13
Rest HR: 54 {beats}/min
SDS: 1

## 2014-11-28 MED ORDER — TECHNETIUM TC 99M SESTAMIBI GENERIC - CARDIOLITE
32.7000 | Freq: Once | INTRAVENOUS | Status: AC | PRN
  Administered 2014-11-28: 10.9 via INTRAVENOUS

## 2014-11-28 MED ORDER — REGADENOSON 0.4 MG/5ML IV SOLN
0.4000 mg | Freq: Once | INTRAVENOUS | Status: AC
  Administered 2014-11-28: 0.4 mg via INTRAVENOUS

## 2014-11-28 MED ORDER — TECHNETIUM TC 99M SESTAMIBI GENERIC - CARDIOLITE
10.9000 | Freq: Once | INTRAVENOUS | Status: AC | PRN
  Administered 2014-11-28: 10.9 via INTRAVENOUS

## 2014-12-05 ENCOUNTER — Ambulatory Visit: Payer: Self-pay | Admitting: Internal Medicine

## 2014-12-06 ENCOUNTER — Telehealth: Payer: Self-pay | Admitting: *Deleted

## 2014-12-06 ENCOUNTER — Encounter: Payer: Self-pay | Admitting: *Deleted

## 2014-12-06 NOTE — Telephone Encounter (Signed)
-----   Message from Troy Sine, MD sent at 12/03/2014  8:03 AM EST ----- Low risk; nl nuc

## 2014-12-06 NOTE — Telephone Encounter (Signed)
Called to give results. Patient not available. Will mail a note.

## 2014-12-12 ENCOUNTER — Ambulatory Visit (INDEPENDENT_AMBULATORY_CARE_PROVIDER_SITE_OTHER): Payer: Medicare Other | Admitting: *Deleted

## 2014-12-12 ENCOUNTER — Telehealth: Payer: Self-pay | Admitting: Internal Medicine

## 2014-12-12 DIAGNOSIS — D518 Other vitamin B12 deficiency anemias: Secondary | ICD-10-CM | POA: Diagnosis not present

## 2014-12-12 MED ORDER — CYANOCOBALAMIN 1000 MCG/ML IJ SOLN
1000.0000 ug | Freq: Once | INTRAMUSCULAR | Status: AC
  Administered 2014-12-12: 1000 ug via INTRAMUSCULAR

## 2014-12-12 MED ORDER — OXYCODONE-ACETAMINOPHEN 10-325 MG PO TABS: 1.0000 | ORAL_TABLET | Freq: Two times a day (BID) | ORAL | Status: DC | PRN

## 2014-12-12 NOTE — Telephone Encounter (Signed)
Pt has an appt today and would like to pick up hydrocodone today

## 2014-12-12 NOTE — Telephone Encounter (Signed)
Left message on voicemail Rx ready for pickup. Rx printed and signed by Dr. Sherren Mocha.

## 2014-12-21 ENCOUNTER — Other Ambulatory Visit: Payer: Self-pay | Admitting: Family Medicine

## 2014-12-21 MED ORDER — NEBIVOLOL HCL 10 MG PO TABS: 5.0000 mg | ORAL_TABLET | Freq: Every day | ORAL | Status: DC

## 2014-12-21 NOTE — Telephone Encounter (Signed)
Sent to the pharmacy by e-scribe.   Pt has upcoming appt in May 2017 for annual wellness exam.

## 2015-01-09 ENCOUNTER — Ambulatory Visit (INDEPENDENT_AMBULATORY_CARE_PROVIDER_SITE_OTHER): Payer: Medicare Other | Admitting: *Deleted

## 2015-01-09 ENCOUNTER — Telehealth: Payer: Self-pay | Admitting: Internal Medicine

## 2015-01-09 DIAGNOSIS — D518 Other vitamin B12 deficiency anemias: Secondary | ICD-10-CM

## 2015-01-09 MED ORDER — CYANOCOBALAMIN 1000 MCG/ML IJ SOLN
1000.0000 ug | Freq: Once | INTRAMUSCULAR | Status: AC
  Administered 2015-01-09: 1000 ug via INTRAMUSCULAR

## 2015-01-09 MED ORDER — OXYCODONE-ACETAMINOPHEN 10-325 MG PO TABS: 1.0000 | ORAL_TABLET | Freq: Two times a day (BID) | ORAL | Status: DC | PRN

## 2015-01-09 NOTE — Telephone Encounter (Signed)
° ° ° ° °

## 2015-01-09 NOTE — Telephone Encounter (Signed)
Pt notified Rx ready for pickup. Rx printed and signed.  

## 2015-02-04 ENCOUNTER — Other Ambulatory Visit: Payer: Self-pay | Admitting: *Deleted

## 2015-02-04 MED ORDER — ISOSORBIDE MONONITRATE ER 60 MG PO TB24: ORAL_TABLET | ORAL | Status: DC

## 2015-02-04 MED ORDER — ETODOLAC 300 MG PO CAPS: ORAL_CAPSULE | ORAL | Status: DC

## 2015-02-21 ENCOUNTER — Ambulatory Visit (INDEPENDENT_AMBULATORY_CARE_PROVIDER_SITE_OTHER): Payer: Medicare Other | Admitting: *Deleted

## 2015-02-21 ENCOUNTER — Telehealth: Payer: Self-pay | Admitting: Internal Medicine

## 2015-02-21 DIAGNOSIS — D518 Other vitamin B12 deficiency anemias: Secondary | ICD-10-CM

## 2015-02-21 MED ORDER — OXYCODONE-ACETAMINOPHEN 10-325 MG PO TABS: 1.0000 | ORAL_TABLET | Freq: Two times a day (BID) | ORAL | Status: DC | PRN

## 2015-02-21 MED ORDER — CYANOCOBALAMIN 1000 MCG/ML IJ SOLN
1000.0000 ug | Freq: Once | INTRAMUSCULAR | Status: AC
  Administered 2015-02-21: 1000 ug via INTRAMUSCULAR

## 2015-02-21 NOTE — Telephone Encounter (Signed)
Pt needs new rx hydrocodone. Pt has an appt at 1145 today

## 2015-02-21 NOTE — Telephone Encounter (Signed)
Pt notified Rx will be ready for pickup when he comes for his injection. Rx printed and signed.

## 2015-03-22 ENCOUNTER — Other Ambulatory Visit: Payer: Self-pay | Admitting: Internal Medicine

## 2015-03-22 ENCOUNTER — Ambulatory Visit (INDEPENDENT_AMBULATORY_CARE_PROVIDER_SITE_OTHER): Payer: Medicare Other | Admitting: *Deleted

## 2015-03-22 DIAGNOSIS — E538 Deficiency of other specified B group vitamins: Secondary | ICD-10-CM

## 2015-03-22 MED ORDER — OXYCODONE-ACETAMINOPHEN 10-325 MG PO TABS: 1.0000 | ORAL_TABLET | Freq: Two times a day (BID) | ORAL | Status: DC | PRN

## 2015-03-22 MED ORDER — CYANOCOBALAMIN 1000 MCG/ML IJ SOLN
1000.0000 ug | Freq: Once | INTRAMUSCULAR | Status: AC
  Administered 2015-03-22: 1000 ug via INTRAMUSCULAR

## 2015-03-22 NOTE — Telephone Encounter (Signed)
° ° ° ° °

## 2015-03-22 NOTE — Telephone Encounter (Signed)
rx ready for pick up and patient is aware  

## 2015-03-22 NOTE — Telephone Encounter (Signed)
Pt last visit 10/22/14 Pt last refill 02/21/15 #60

## 2015-03-25 ENCOUNTER — Telehealth: Payer: Self-pay | Admitting: *Deleted

## 2015-03-25 MED ORDER — ROPINIROLE HCL 2 MG PO TABS: 2.0000 mg | ORAL_TABLET | Freq: Every day | ORAL | Status: DC

## 2015-03-25 NOTE — Telephone Encounter (Signed)
Refill sent.

## 2015-04-23 ENCOUNTER — Ambulatory Visit (INDEPENDENT_AMBULATORY_CARE_PROVIDER_SITE_OTHER): Payer: Medicare Other | Admitting: *Deleted

## 2015-04-23 ENCOUNTER — Telehealth: Payer: Self-pay | Admitting: Internal Medicine

## 2015-04-23 DIAGNOSIS — E538 Deficiency of other specified B group vitamins: Secondary | ICD-10-CM | POA: Diagnosis not present

## 2015-04-23 MED ORDER — CYANOCOBALAMIN 1000 MCG/ML IJ SOLN
1000.0000 ug | Freq: Once | INTRAMUSCULAR | Status: AC
  Administered 2015-04-23: 1000 ug via INTRAMUSCULAR

## 2015-04-23 MED ORDER — OXYCODONE-ACETAMINOPHEN 10-325 MG PO TABS: 1.0000 | ORAL_TABLET | Freq: Two times a day (BID) | ORAL | Status: DC | PRN

## 2015-04-23 NOTE — Telephone Encounter (Signed)
Pt has an appt today at 1145 am and would like to pick up new rx hydrocdone

## 2015-04-23 NOTE — Telephone Encounter (Signed)
Left message on voicemail Rx ready for pickup. Rx printed and signed.  

## 2015-05-13 DIAGNOSIS — N138 Other obstructive and reflux uropathy: Secondary | ICD-10-CM | POA: Diagnosis not present

## 2015-05-13 DIAGNOSIS — Z8551 Personal history of malignant neoplasm of bladder: Secondary | ICD-10-CM | POA: Diagnosis not present

## 2015-05-13 DIAGNOSIS — N358 Other urethral stricture: Secondary | ICD-10-CM | POA: Diagnosis not present

## 2015-05-13 DIAGNOSIS — N401 Enlarged prostate with lower urinary tract symptoms: Secondary | ICD-10-CM | POA: Diagnosis not present

## 2015-05-13 DIAGNOSIS — R3912 Poor urinary stream: Secondary | ICD-10-CM | POA: Diagnosis not present

## 2015-05-13 DIAGNOSIS — Z0000 Encounter for general adult medical examination without abnormal findings: Secondary | ICD-10-CM | POA: Diagnosis not present

## 2015-05-28 ENCOUNTER — Telehealth: Payer: Self-pay | Admitting: Internal Medicine

## 2015-05-28 ENCOUNTER — Ambulatory Visit (INDEPENDENT_AMBULATORY_CARE_PROVIDER_SITE_OTHER): Payer: Medicare Other | Admitting: *Deleted

## 2015-05-28 DIAGNOSIS — E538 Deficiency of other specified B group vitamins: Secondary | ICD-10-CM | POA: Diagnosis not present

## 2015-05-28 MED ORDER — OXYCODONE-ACETAMINOPHEN 10-325 MG PO TABS: 1.0000 | ORAL_TABLET | Freq: Two times a day (BID) | ORAL | Status: DC | PRN

## 2015-05-28 MED ORDER — CYANOCOBALAMIN 1000 MCG/ML IJ SOLN
1000.0000 ug | Freq: Once | INTRAMUSCULAR | Status: AC
  Administered 2015-05-28: 1000 ug via INTRAMUSCULAR

## 2015-05-28 NOTE — Telephone Encounter (Signed)
Pt need new Rx for Oxycodone.

## 2015-05-28 NOTE — Telephone Encounter (Signed)
Pt notified Rx ready for pickup, also need to schedule physical. Pt verbalized understanding. Rx printed and signed.

## 2015-06-03 ENCOUNTER — Other Ambulatory Visit: Payer: Self-pay

## 2015-06-03 NOTE — Telephone Encounter (Signed)
Okay to refill? 

## 2015-06-03 NOTE — Telephone Encounter (Signed)
PT REQUESTING A REFILL ON ETODOLAC 300MG  TAKE ONE CAP BY MOUTH EVERY 8 HRS  LAST FILLED 02/04/15, LAST OV 10/22/2014. OKAY TO REFILL?

## 2015-06-04 ENCOUNTER — Other Ambulatory Visit: Payer: Self-pay

## 2015-06-04 MED ORDER — ETODOLAC 300 MG PO CAPS: ORAL_CAPSULE | ORAL | Status: DC

## 2015-06-04 NOTE — Telephone Encounter (Signed)
Okay to refill? 

## 2015-06-07 ENCOUNTER — Ambulatory Visit (INDEPENDENT_AMBULATORY_CARE_PROVIDER_SITE_OTHER): Payer: Medicare Other | Admitting: Internal Medicine

## 2015-06-07 ENCOUNTER — Other Ambulatory Visit: Payer: Self-pay | Admitting: *Deleted

## 2015-06-07 ENCOUNTER — Telehealth: Payer: Self-pay | Admitting: Internal Medicine

## 2015-06-07 ENCOUNTER — Encounter: Payer: Self-pay | Admitting: Internal Medicine

## 2015-06-07 VITALS — BP 140/80 | HR 67 | Temp 98.0°F | Ht 67.25 in | Wt 188.0 lb

## 2015-06-07 DIAGNOSIS — I2510 Atherosclerotic heart disease of native coronary artery without angina pectoris: Secondary | ICD-10-CM | POA: Diagnosis not present

## 2015-06-07 DIAGNOSIS — D518 Other vitamin B12 deficiency anemias: Secondary | ICD-10-CM | POA: Diagnosis not present

## 2015-06-07 DIAGNOSIS — M5440 Lumbago with sciatica, unspecified side: Secondary | ICD-10-CM

## 2015-06-07 DIAGNOSIS — E039 Hypothyroidism, unspecified: Secondary | ICD-10-CM | POA: Diagnosis not present

## 2015-06-07 DIAGNOSIS — G8929 Other chronic pain: Secondary | ICD-10-CM

## 2015-06-07 LAB — CBC WITH DIFFERENTIAL/PLATELET
BASOS ABS: 0 10*3/uL (ref 0.0–0.1)
Basophils Relative: 0.5 % (ref 0.0–3.0)
EOS PCT: 5 % (ref 0.0–5.0)
Eosinophils Absolute: 0.4 10*3/uL (ref 0.0–0.7)
HCT: 42.9 % (ref 39.0–52.0)
HEMOGLOBIN: 14.4 g/dL (ref 13.0–17.0)
Lymphocytes Relative: 30.4 % (ref 12.0–46.0)
Lymphs Abs: 2.1 10*3/uL (ref 0.7–4.0)
MCHC: 33.5 g/dL (ref 30.0–36.0)
MCV: 87.8 fl (ref 78.0–100.0)
MONO ABS: 0.5 10*3/uL (ref 0.1–1.0)
MONOS PCT: 7.6 % (ref 3.0–12.0)
Neutro Abs: 3.9 10*3/uL (ref 1.4–7.7)
Neutrophils Relative %: 56.5 % (ref 43.0–77.0)
Platelets: 177 10*3/uL (ref 150.0–400.0)
RBC: 4.89 Mil/uL (ref 4.22–5.81)
RDW: 14.1 % (ref 11.5–15.5)
WBC: 7 10*3/uL (ref 4.0–10.5)

## 2015-06-07 LAB — VITAMIN B12: VITAMIN B 12: 739 pg/mL (ref 211–911)

## 2015-06-07 LAB — TSH: TSH: 2.87 u[IU]/mL (ref 0.35–4.50)

## 2015-06-07 MED ORDER — ATORVASTATIN CALCIUM 40 MG PO TABS: 40.0000 mg | ORAL_TABLET | Freq: Every day | ORAL | Status: DC

## 2015-06-07 NOTE — Patient Instructions (Signed)
Limit your sodium (Salt) intake  Follow up with chronic pain clinic as discussed  Return in 6 months for follow-up

## 2015-06-07 NOTE — Telephone Encounter (Signed)
Please call in a new prescription for atorvastatin 40 mg #90 one daily

## 2015-06-07 NOTE — Progress Notes (Signed)
Pre visit review using our clinic review tool, if applicable. No additional management support is needed unless otherwise documented below in the visit note. 

## 2015-06-07 NOTE — Telephone Encounter (Signed)
Rx sent to pharmacy   

## 2015-06-07 NOTE — Telephone Encounter (Signed)
Pt saw Dr Raliegh Ip today and was told he was going to call him in a different cholesterol. He checked with the pharmacy and they have nothing.    [facility]

## 2015-06-07 NOTE — Progress Notes (Signed)
Subjective:    Patient ID: [name], male    DOB: [birth year], 75 y.o.   MRN: [id number]  HPI  75 year old patient who is scheduled for a physical today, but he declines. He complains of worsening the left leg pain.  He has had a cervical and 2, lumbar procedures, last approximately 10 years ago.  He has been on chronic narcotics for in excess of 10 years.  He states that his back and left leg pain, especially have worsened over the past several months. He has been on oxycodone 1 half tablet 3 times daily.  He also is on gabapentin. He has not seen his neurosurgeon since his last procedure  Past Medical History  Diagnosis Date  . Esophagitis   . Colon polyps   . History of bladder cancer   . Esophagus, Barrett's   . Stenosis of esophagus   . Localized osteoarthrosis, lower leg   . Malaise and fatigue   . Hypothyroidism   . Hypertension   . Anemia   . B12 deficiency anemia   . GERD (gastroesophageal reflux disease)   . Hyperlipidemia   . Depression   . Allergy   . CAD (coronary artery disease)   . COPD (chronic obstructive pulmonary disease) ([place])   . Low back pain   . Blood transfusion without reported diagnosis   . Cancer [facility])     bladder cancer 8 times     Social History   Social History  . Marital Status: Married    Spouse Name: N/A  . Number of Children: N/A  . Years of Education: N/A   Occupational History  . Not on file.   Social History Main Topics  . Smoking status: Former Smoker    Quit date: 03/30/1976  . Smokeless tobacco: Never Used     Comment: quit in 1978  . Alcohol Use: No  . Drug Use: No  . Sexual Activity: Yes   Other Topics Concern  . Not on file   Social History Narrative    Past Surgical History  Procedure Laterality Date  . Coronary artery bypass graft      x4  . Cervical discectomy      ACDF  . Lumbar laminectomy      and fusion  . Bladder cancer    . Popliteal synovial cyst excision    . Knee arthroscopy    .  Esophagogastroduodenoscopy  04/29/2010  . Colonoscopy  11/17/2005    normal     Family History  Problem Relation Age of Onset  . Coronary artery disease    . Cancer Mother     melanoma  . Stroke Father   . Hypertension Father   . Colon cancer Neg Hx   . Esophageal cancer Neg Hx   . Stomach cancer Neg Hx   . Rectal cancer Neg Hx     Allergies  Allergen Reactions  . Adhesive [Tape]     Itching, reddened skin    Current Outpatient Prescriptions on File Prior to Visit  Medication Sig Dispense Refill  . aspirin 81 MG tablet Take 81 mg by mouth daily.      [name] etodolac (LODINE) 300 MG capsule TAKE ONE CAPSULE BY MOUTH EVERY 8 HOURS [id number] capsule 0  . folic acid (FOLVITE) A999333 MCG tablet Take 400 mcg by mouth daily.      [name] gabapentin (NEURONTIN) 300 MG capsule Take 1 capsule by mouth 2 (two) times daily.    . isosorbide mononitrate (  IMDUR) 60 MG 24 hr tablet TAKE ONE TABLET BY MOUTH DAILY. 180 tablet 1  . losartan (COZAAR) 50 MG tablet TAKE ONE TABLET BY MOUTH ONCE DAILY 90 tablet 1  . meclizine (ANTIVERT) 25 MG tablet TAKE ONE TABLET BY MOUTH THREE TIMES DAILY AS NEEDED FOR DIZZINESS 30 tablet 2  . nebivolol (BYSTOLIC) 10 MG tablet Take 0.5 tablets (5 mg total) by mouth daily. 45 tablet 1  . nitroGLYCERIN (NITROSTAT) 0.4 MG SL tablet Place 1 tablet (0.4 mg total) under the tongue every 5 (five) minutes as needed for chest pain. 30 tablet 3  . oxyCODONE-acetaminophen (PERCOCET) 10-325 MG tablet Take 1 tablet by mouth 2 (two) times daily as needed. 60 tablet 0  . rOPINIRole (REQUIP) 2 MG tablet Take 1 tablet (2 mg total) by mouth at bedtime. 90 tablet 1  . venlafaxine (EFFEXOR) 75 MG tablet Take 1 tablet (75 mg total) by mouth daily. 90 tablet 3   No current facility-administered medications on file prior to visit.    BP 140/80 mmHg  Pulse 67  Temp(Src) 98 F (36.7 C) (Oral)  Ht 5' 7.25" (1.708 m)  Wt 188 lb (85.276 kg)  BMI 29.23 kg/m2  SpO2 96%     Review of Systems    Constitutional: Negative for fever, chills, appetite change and fatigue.  HENT: Negative for congestion, dental problem, ear pain, hearing loss, sore throat, tinnitus, trouble swallowing and voice change.   Eyes: Negative for pain, discharge and visual disturbance.  Respiratory: Negative for cough, chest tightness, wheezing and stridor.   Cardiovascular: Negative for chest pain, palpitations and leg swelling.  Gastrointestinal: Negative for nausea, vomiting, abdominal pain, diarrhea, constipation, blood in stool and abdominal distention.  Genitourinary: Negative for urgency, hematuria, flank pain, discharge, difficulty urinating and genital sores.  Musculoskeletal: Positive for back pain. Negative for myalgias, joint swelling, arthralgias, gait problem and neck stiffness.  Skin: Negative for rash.  Neurological: Negative for dizziness, syncope, speech difficulty, weakness, numbness and headaches.  Hematological: Negative for adenopathy. Does not bruise/bleed easily.  Psychiatric/Behavioral: Negative for behavioral problems and dysphoric mood. The patient is not nervous/anxious.        Objective:   Physical Exam  Constitutional: He is oriented to person, place, and time. He appears well-developed.  HENT:  Head: Normocephalic.  Right Ear: External ear normal.  Left Ear: External ear normal.  Eyes: Conjunctivae and EOM are normal.  Neck: Normal range of motion.  Cardiovascular: Normal rate and normal heart sounds.   Pulmonary/Chest: Breath sounds normal.  Abdominal: Bowel sounds are normal.  Musculoskeletal: Normal range of motion. He exhibits no edema or tenderness.  Neurological: He is alert and oriented to person, place, and time.  Psychiatric: He has a normal mood and affect. His behavior is normal.          Assessment & Plan:   Chronic back and left leg pain.  Will schedule appointment with pain management.  May need follow-up MRI Essential hypertension,  stable Hypothyroidism.  We'll check TSH History of B12 deficiency.  We'll check a B12 level  Recheck 6 months

## 2015-06-21 ENCOUNTER — Ambulatory Visit (INDEPENDENT_AMBULATORY_CARE_PROVIDER_SITE_OTHER): Payer: Medicare Other | Admitting: Internal Medicine

## 2015-06-21 ENCOUNTER — Encounter: Payer: Self-pay | Admitting: Internal Medicine

## 2015-06-21 DIAGNOSIS — I10 Essential (primary) hypertension: Secondary | ICD-10-CM | POA: Diagnosis not present

## 2015-06-21 DIAGNOSIS — R300 Dysuria: Secondary | ICD-10-CM

## 2015-06-21 DIAGNOSIS — N139 Obstructive and reflux uropathy, unspecified: Secondary | ICD-10-CM

## 2015-06-21 DIAGNOSIS — I2510 Atherosclerotic heart disease of native coronary artery without angina pectoris: Secondary | ICD-10-CM

## 2015-06-21 DIAGNOSIS — E039 Hypothyroidism, unspecified: Secondary | ICD-10-CM | POA: Diagnosis not present

## 2015-06-21 DIAGNOSIS — N3000 Acute cystitis without hematuria: Secondary | ICD-10-CM

## 2015-06-21 LAB — POC URINALSYSI DIPSTICK (AUTOMATED)
BILIRUBIN UA: 3
Blood, UA: 2
Glucose, UA: NEGATIVE
Ketones, UA: 1
NITRITE UA: NEGATIVE
PH UA: 5.5
Protein, UA: 3
Spec Grav, UA: 1.025
Urobilinogen, UA: 0.2

## 2015-06-21 MED ORDER — SULFAMETHOXAZOLE-TRIMETHOPRIM 800-160 MG PO TABS: 1.0000 | ORAL_TABLET | Freq: Two times a day (BID) | ORAL | Status: DC

## 2015-06-21 MED ORDER — TAMSULOSIN HCL 0.4 MG PO CAPS: 0.4000 mg | ORAL_CAPSULE | Freq: Every day | ORAL | Status: DC

## 2015-06-21 NOTE — Progress Notes (Signed)
Pre visit review using our clinic review tool, if applicable. No additional management support is needed unless otherwise documented below in the visit note. 

## 2015-06-21 NOTE — Progress Notes (Signed)
Subjective:    Patient ID: [name], male    DOB: August 22, 1940, 75 y.o.   MRN: [id number]  HPI  75 year old patient who is followed every 6 months by urology due to a history of bladder cancer (times 8).  He had cystoscopy 2 or 3 weeks ago.  Since that time he has had some penile discomfort as well as dysuria.  He states that he has had intermittent fever as high as 102. He also complains of chronic urinary frequency.  He states this has been present for years.  His prior provider listed a diagnosis of urinary obstruction.  He states he urology has not checked his prostate in a couple of years.  He was scheduled here for a physical recently, but he declined and his prostate was not examined.  He only has nocturia times 2. Urinalysis did reveal significant pyuria  Past Medical History  Diagnosis Date  . Esophagitis   . Colon polyps   . History of bladder cancer   . Esophagus, Barrett's   . Stenosis of esophagus   . Localized osteoarthrosis, lower leg   . Malaise and fatigue   . Hypothyroidism   . Hypertension   . Anemia   . B12 deficiency anemia   . GERD (gastroesophageal reflux disease)   . Hyperlipidemia   . Depression   . Allergy   . CAD (coronary artery disease)   . COPD (chronic obstructive pulmonary disease) ([place])   . Low back pain   . Blood transfusion without reported diagnosis   . Cancer [facility])     bladder cancer 8 times     Social History   Social History  . Marital Status: Married    Spouse Name: N/A  . Number of Children: N/A  . Years of Education: N/A   Occupational History  . Not on file.   Social History Main Topics  . Smoking status: Former Smoker    Quit date: 03/30/1976  . Smokeless tobacco: Never Used     Comment: quit in 1978  . Alcohol Use: No  . Drug Use: No  . Sexual Activity: Yes   Other Topics Concern  . Not on file   Social History Narrative    Past Surgical History  Procedure Laterality Date  . Coronary artery bypass  graft      x4  . Cervical discectomy      ACDF  . Lumbar laminectomy      and fusion  . Bladder cancer    . Popliteal synovial cyst excision    . Knee arthroscopy    . Esophagogastroduodenoscopy  04/29/2010  . Colonoscopy  11/17/2005    normal     Family History  Problem Relation Age of Onset  . Coronary artery disease    . Cancer Mother     melanoma  . Stroke Father   . Hypertension Father   . Colon cancer Neg Hx   . Esophageal cancer Neg Hx   . Stomach cancer Neg Hx   . Rectal cancer Neg Hx     Allergies  Allergen Reactions  . Adhesive [Tape]     Itching, reddened skin    Current Outpatient Prescriptions on File Prior to Visit  Medication Sig Dispense Refill  . aspirin 81 MG tablet Take 81 mg by mouth daily.      [name] atorvastatin (LIPITOR) 40 MG tablet Take 1 tablet (40 mg total) by mouth daily. 90 tablet 0  . etodolac (LODINE) 300 MG  capsule TAKE ONE CAPSULE BY MOUTH EVERY 8 HOURS 99991111 capsule 0  . folic acid (FOLVITE) A999333 MCG tablet Take 400 mcg by mouth daily.      [name] gabapentin (NEURONTIN) 300 MG capsule Take 1 capsule by mouth 2 (two) times daily.    . isosorbide mononitrate (IMDUR) 60 MG 24 hr tablet TAKE ONE TABLET BY MOUTH DAILY. 180 tablet 1  . losartan (COZAAR) 50 MG tablet TAKE ONE TABLET BY MOUTH ONCE DAILY 90 tablet 1  . nebivolol (BYSTOLIC) 10 MG tablet Take 0.5 tablets (5 mg total) by mouth daily. 45 tablet 1  . nitroGLYCERIN (NITROSTAT) 0.4 MG SL tablet Place 1 tablet (0.4 mg total) under the tongue every 5 (five) minutes as needed for chest pain. 30 tablet 3  . omeprazole (PRILOSEC) 10 MG capsule Take 10 mg by mouth daily.    [name] oxyCODONE-acetaminophen (PERCOCET) 10-325 MG tablet Take 1 tablet by mouth 2 (two) times daily as needed. 60 tablet 0  . rOPINIRole (REQUIP) 2 MG tablet Take 1 tablet (2 mg total) by mouth at bedtime. 90 tablet 1  . venlafaxine (EFFEXOR) 75 MG tablet Take 1 tablet (75 mg total) by mouth daily. 90 tablet 3  . meclizine (ANTIVERT) 25  MG tablet TAKE ONE TABLET BY MOUTH THREE TIMES DAILY AS NEEDED FOR DIZZINESS (Patient not taking: Reported on 06/21/2015) 30 tablet 2   No current facility-administered medications on file prior to visit.    BP 160/78 mmHg  Pulse 65  Temp(Src) 97.9 F (36.6 C) (Oral)  Ht 5' 7.25" (1.708 m)  Wt 193 lb (87.544 kg)  BMI 30.01 kg/m2  SpO2 95%     Review of Systems  Constitutional: Negative for fever, chills, appetite change and fatigue.  HENT: Negative for congestion, dental problem, ear pain, hearing loss, sore throat, tinnitus, trouble swallowing and voice change.   Eyes: Negative for pain, discharge and visual disturbance.  Respiratory: Negative for cough, chest tightness, wheezing and stridor.   Cardiovascular: Negative for chest pain, palpitations and leg swelling.  Gastrointestinal: Negative for nausea, vomiting, abdominal pain, diarrhea, constipation, blood in stool and abdominal distention.  Genitourinary: Positive for dysuria, urgency, frequency and penile pain. Negative for hematuria, flank pain, discharge, difficulty urinating and genital sores.  Musculoskeletal: Negative for myalgias, back pain, joint swelling, arthralgias, gait problem and neck stiffness.  Skin: Negative for rash.  Neurological: Negative for dizziness, syncope, speech difficulty, weakness, numbness and headaches.  Hematological: Negative for adenopathy. Does not bruise/bleed easily.  Psychiatric/Behavioral: Negative for behavioral problems and dysphoric mood. The patient is not nervous/anxious.        Objective:   Physical Exam  Constitutional: He is oriented to person, place, and time. He appears well-developed.  HENT:  Head: Normocephalic.  Right Ear: External ear normal.  Left Ear: External ear normal.  Eyes: Conjunctivae and EOM are normal.  Neck: Normal range of motion.  Cardiovascular: Normal rate and normal heart sounds.   Pulmonary/Chest: Breath sounds normal.  Abdominal: Bowel sounds are  normal.  Musculoskeletal: Normal range of motion. He exhibits no edema or tenderness.  Neurological: He is alert and oriented to person, place, and time.  Psychiatric: He has a normal mood and affect. His behavior is normal.          Assessment & Plan:   UTI following cystoscopy.  Will check a urine culture.  Will treat with Septra DS for 7 days Urinary frequency.  Will give a trial of Flomax History of bladder cancer Social  hypertension, stable  Nyoka Cowden, MD

## 2015-06-21 NOTE — Patient Instructions (Signed)
Take your antibiotic as prescribed until ALL of it is gone, but stop if you develop a rash, swelling, or any side effects of the medication.  Contact our office as soon as possible if  there are side effects of the medication.  Drink as much fluid as you  can tolerate over the next few days

## 2015-06-23 LAB — URINE CULTURE: Colony Count: >=100000

## 2015-06-25 ENCOUNTER — Telehealth: Payer: Self-pay | Admitting: Internal Medicine

## 2015-06-25 ENCOUNTER — Ambulatory Visit (INDEPENDENT_AMBULATORY_CARE_PROVIDER_SITE_OTHER): Payer: Medicare Other | Admitting: *Deleted

## 2015-06-25 DIAGNOSIS — E538 Deficiency of other specified B group vitamins: Secondary | ICD-10-CM | POA: Diagnosis not present

## 2015-06-25 MED ORDER — CYANOCOBALAMIN 1000 MCG/ML IJ SOLN
1000.0000 ug | Freq: Once | INTRAMUSCULAR | Status: AC
  Administered 2015-06-25: 1000 ug via INTRAMUSCULAR

## 2015-06-25 MED ORDER — OXYCODONE-ACETAMINOPHEN 10-325 MG PO TABS: 1.0000 | ORAL_TABLET | Freq: Two times a day (BID) | ORAL | Status: DC | PRN

## 2015-06-25 NOTE — Telephone Encounter (Signed)
Left message Rx ready for pickup, will be at the front desk. Rx printed and signed.  

## 2015-06-25 NOTE — Telephone Encounter (Signed)
Pt request refill of the following:   oxyCODONE-acetaminophen (PERCOCET) 10-325 MG tablet   Pt will be in at 11:45 for b12 injection    Phamacy:

## 2015-06-28 DIAGNOSIS — R310 Gross hematuria: Secondary | ICD-10-CM | POA: Diagnosis not present

## 2015-06-28 DIAGNOSIS — N359 Urethral stricture, unspecified: Secondary | ICD-10-CM | POA: Diagnosis not present

## 2015-07-25 ENCOUNTER — Telehealth: Payer: Self-pay | Admitting: Internal Medicine

## 2015-07-25 ENCOUNTER — Other Ambulatory Visit: Payer: Self-pay

## 2015-07-25 ENCOUNTER — Ambulatory Visit: Payer: Medicare Other | Admitting: *Deleted

## 2015-07-25 MED ORDER — NEBIVOLOL HCL 10 MG PO TABS: 5.0000 mg | ORAL_TABLET | Freq: Every day | ORAL | Status: DC

## 2015-07-25 NOTE — Telephone Encounter (Signed)
Pt request refill  oxyCODONE-acetaminophen (PERCOCET) 10-325 MG tablet  Pt would like to pick up Friday 7/7 at his b12 appointment

## 2015-07-26 ENCOUNTER — Ambulatory Visit (INDEPENDENT_AMBULATORY_CARE_PROVIDER_SITE_OTHER): Payer: Medicare Other | Admitting: *Deleted

## 2015-07-26 ENCOUNTER — Other Ambulatory Visit: Payer: Self-pay | Admitting: *Deleted

## 2015-07-26 DIAGNOSIS — E538 Deficiency of other specified B group vitamins: Secondary | ICD-10-CM

## 2015-07-26 MED ORDER — ETODOLAC 300 MG PO CAPS: ORAL_CAPSULE | ORAL | Status: DC

## 2015-07-26 MED ORDER — CYANOCOBALAMIN 1000 MCG/ML IJ SOLN
1000.0000 ug | INTRAMUSCULAR | Status: DC
  Administered 2015-07-26: 1000 ug via INTRAMUSCULAR

## 2015-07-26 MED ORDER — OXYCODONE-ACETAMINOPHEN 10-325 MG PO TABS: 1.0000 | ORAL_TABLET | Freq: Two times a day (BID) | ORAL | Status: DC | PRN

## 2015-07-26 NOTE — Telephone Encounter (Signed)
Pt notified Rx ready for pickup. Rx printed and signed.  

## 2015-08-07 ENCOUNTER — Encounter (HOSPITAL_COMMUNITY): Payer: Self-pay | Admitting: Emergency Medicine

## 2015-08-07 DIAGNOSIS — Z87891 Personal history of nicotine dependence: Secondary | ICD-10-CM | POA: Insufficient documentation

## 2015-08-07 DIAGNOSIS — Z8551 Personal history of malignant neoplasm of bladder: Secondary | ICD-10-CM | POA: Insufficient documentation

## 2015-08-07 DIAGNOSIS — I2510 Atherosclerotic heart disease of native coronary artery without angina pectoris: Secondary | ICD-10-CM | POA: Diagnosis not present

## 2015-08-07 DIAGNOSIS — I10 Essential (primary) hypertension: Secondary | ICD-10-CM | POA: Diagnosis not present

## 2015-08-07 DIAGNOSIS — N50819 Testicular pain, unspecified: Secondary | ICD-10-CM | POA: Diagnosis not present

## 2015-08-07 DIAGNOSIS — N50811 Right testicular pain: Secondary | ICD-10-CM | POA: Diagnosis present

## 2015-08-07 DIAGNOSIS — J449 Chronic obstructive pulmonary disease, unspecified: Secondary | ICD-10-CM | POA: Insufficient documentation

## 2015-08-07 DIAGNOSIS — N452 Orchitis: Secondary | ICD-10-CM | POA: Insufficient documentation

## 2015-08-07 DIAGNOSIS — N433 Hydrocele, unspecified: Secondary | ICD-10-CM | POA: Diagnosis not present

## 2015-08-07 DIAGNOSIS — Z7982 Long term (current) use of aspirin: Secondary | ICD-10-CM | POA: Insufficient documentation

## 2015-08-07 DIAGNOSIS — Z951 Presence of aortocoronary bypass graft: Secondary | ICD-10-CM | POA: Insufficient documentation

## 2015-08-07 NOTE — ED Notes (Signed)
Pt. reports worsening right testicular pain with swelling onset last week , denies injury /mild dysuria . No fever or chills.

## 2015-08-08 ENCOUNTER — Emergency Department (HOSPITAL_COMMUNITY): Payer: Medicare Other

## 2015-08-08 ENCOUNTER — Emergency Department (HOSPITAL_COMMUNITY)
Admission: EM | Admit: 2015-08-08 | Discharge: 2015-08-08 | Disposition: A | Discharge status: Home / Self Care | Payer: Medicare Other | Attending: Emergency Medicine | Admitting: Emergency Medicine

## 2015-08-08 DIAGNOSIS — N433 Hydrocele, unspecified: Secondary | ICD-10-CM | POA: Diagnosis not present

## 2015-08-08 DIAGNOSIS — N452 Orchitis: Secondary | ICD-10-CM

## 2015-08-08 DIAGNOSIS — R52 Pain, unspecified: Secondary | ICD-10-CM

## 2015-08-08 LAB — URINALYSIS, ROUTINE W REFLEX MICROSCOPIC
Glucose, UA: NEGATIVE mg/dL
KETONES UR: 15 mg/dL — AB
NITRITE: NEGATIVE
PH: 5 (ref 5.0–8.0)
Protein, ur: 100 mg/dL — AB
SPECIFIC GRAVITY, URINE: 1.03 (ref 1.005–1.030)

## 2015-08-08 LAB — URINE MICROSCOPIC-ADD ON

## 2015-08-08 LAB — COMPREHENSIVE METABOLIC PANEL
ALK PHOS: 70 U/L (ref 38–126)
ALT: 15 U/L — ABNORMAL LOW (ref 17–63)
ANION GAP: 6 (ref 5–15)
AST: 21 U/L (ref 15–41)
Albumin: 3.6 g/dL (ref 3.5–5.0)
BILIRUBIN TOTAL: 0.5 mg/dL (ref 0.3–1.2)
BUN: 23 mg/dL — ABNORMAL HIGH (ref 6–20)
CALCIUM: 9.1 mg/dL (ref 8.9–10.3)
CO2: 27 mmol/L (ref 22–32)
Chloride: 107 mmol/L (ref 101–111)
Creatinine, Ser: 1.42 mg/dL — ABNORMAL HIGH (ref 0.61–1.24)
GFR, EST AFRICAN AMERICAN: 55 mL/min — AB (ref >60)
GFR, EST NON AFRICAN AMERICAN: 47 mL/min — AB (ref >60)
GLUCOSE: 124 mg/dL — AB (ref 65–99)
Potassium: 4.4 mmol/L (ref 3.5–5.1)
Sodium: 140 mmol/L (ref 135–145)
TOTAL PROTEIN: 7.4 g/dL (ref 6.5–8.1)

## 2015-08-08 LAB — CBC WITH DIFFERENTIAL/PLATELET
Basophils Absolute: 0 10*3/uL (ref 0.0–0.1)
Basophils Relative: 0 %
Eosinophils Absolute: 0.1 10*3/uL (ref 0.0–0.7)
Eosinophils Relative: 1 %
HEMATOCRIT: 35.3 % — AB (ref 39.0–52.0)
HEMOGLOBIN: 11.8 g/dL — AB (ref 13.0–17.0)
LYMPHS ABS: 1.7 10*3/uL (ref 0.7–4.0)
Lymphocytes Relative: 12 %
MCH: 29.8 pg (ref 26.0–34.0)
MCHC: 33.4 g/dL (ref 30.0–36.0)
MCV: 89.1 fL (ref 78.0–100.0)
MONO ABS: 1 10*3/uL (ref 0.1–1.0)
MONOS PCT: 7 %
NEUTROS ABS: 11.2 10*3/uL — AB (ref 1.7–7.7)
NEUTROS PCT: 80 %
Platelets: 170 10*3/uL (ref 150–400)
RBC: 3.96 MIL/uL — ABNORMAL LOW (ref 4.22–5.81)
RDW: 14.5 % (ref 11.5–15.5)
WBC: 14.1 10*3/uL — ABNORMAL HIGH (ref 4.0–10.5)

## 2015-08-08 MED ORDER — CEFTRIAXONE SODIUM 250 MG IJ SOLR
250.0000 mg | Freq: Once | INTRAMUSCULAR | Status: AC
  Administered 2015-08-08: 250 mg via INTRAMUSCULAR
  Filled 2015-08-08: qty 250

## 2015-08-08 MED ORDER — LEVOFLOXACIN 750 MG PO TABS: ORAL_TABLET | ORAL | Status: DC

## 2015-08-08 MED ORDER — LEVOFLOXACIN 750 MG PO TABS
750.0000 mg | ORAL_TABLET | Freq: Once | ORAL | Status: AC
  Administered 2015-08-08: 750 mg via ORAL
  Filled 2015-08-08: qty 1

## 2015-08-08 NOTE — ED Provider Notes (Signed)
CSN: [id number]     Arrival date & time 08/07/15  2317 History  By signing my name below, I, Evelene Croon, attest that this documentation has been prepared under the direction and in the presence of Ripley Fraise, MD . Electronically Signed: Evelene Croon, Scribe. 08/08/2015. 2:38 AM.  Chief Complaint  Patient presents with  . Testicle Pain    Patient is a 75 y.o. male presenting with testicular pain. The history is provided by the patient. No language interpreter was used.  Testicle Pain This is a recurrent problem. The problem occurs every several days. The problem has been resolved. Pertinent negatives include no chest pain, no abdominal pain and no shortness of breath. Nothing relieves the symptoms. The treatment provided no relief.    HPI Comments:  [name] is a 75 y.o. male who presents to the Emergency Department complaining of 8/10, testicular pain which began ~1430 this evening. He notes the pain resolved~1930. He denies recent injury and penile discharge.  Pt states ~ 1 week ago he had a similar episode of pain and since then he has been having intermittent fevers which also returned today, reports TMAX of  102. Pt has been evaluated by his Urologist for his symptoms as well as abdominal pain and dysuria which he has been experiencing for a few weeks; denies abdominal pain and dysuria at this time. Pt notes he was placed on antibiotics which he took for  the last 30 days. He was then started on another antibiotic 4 days ago. Pt has a  h/o bladder CA which is currently in remission. He denies h/o prostate CA and other prostate issues. No alleviating factors noted.    Past Medical History  Diagnosis Date  . Esophagitis   . Colon polyps   . History of bladder cancer   . Esophagus, Barrett's   . Stenosis of esophagus   . Localized osteoarthrosis, lower leg   . Malaise and fatigue   . Hypothyroidism   . Hypertension   . Anemia   . B12 deficiency anemia   . GERD  (gastroesophageal reflux disease)   . Hyperlipidemia   . Depression   . Allergy   . CAD (coronary artery disease)   . COPD (chronic obstructive pulmonary disease) ([place])   . Low back pain   . Blood transfusion without reported diagnosis   . Cancer [facility])     bladder cancer 8 times   Past Surgical History  Procedure Laterality Date  . Coronary artery bypass graft      x4  . Cervical discectomy      ACDF  . Lumbar laminectomy      and fusion  . Bladder cancer    . Popliteal synovial cyst excision    . Knee arthroscopy    . Esophagogastroduodenoscopy  04/29/2010  . Colonoscopy  11/17/2005    normal    Family History  Problem Relation Age of Onset  . Coronary artery disease    . Cancer Mother     melanoma  . Stroke Father   . Hypertension Father   . Colon cancer Neg Hx   . Esophageal cancer Neg Hx   . Stomach cancer Neg Hx   . Rectal cancer Neg Hx    Social History  Substance Use Topics  . Smoking status: Former Smoker    Quit date: 03/30/1976  . Smokeless tobacco: Never Used     Comment: quit in 1978  . Alcohol Use: No    Review  of Systems  Constitutional: Negative for fever.  Respiratory: Negative for shortness of breath.   Cardiovascular: Negative for chest pain.  Gastrointestinal: Negative for abdominal pain.  Genitourinary: Positive for testicular pain.  All other systems reviewed and are negative.  Allergies  Adhesive  Home Medications   Prior to Admission medications   Medication Sig Start Date End Date Taking? Authorizing Provider  aspirin 81 MG tablet Take 81 mg by mouth daily.      Historical Provider, MD  atorvastatin (LIPITOR) 40 MG tablet Take 1 tablet (40 mg total) by mouth daily. 06/07/15   Marletta Lor, MD  etodolac (LODINE) 300 MG capsule TAKE ONE CAPSULE BY MOUTH EVERY 8 HOURS 07/26/15   Marletta Lor, MD  folic acid (FOLVITE) A999333 MCG tablet Take 400 mcg by mouth daily.      Historical Provider, MD  gabapentin (NEURONTIN) 300 MG  capsule Take 1 capsule by mouth 2 (two) times daily. 04/28/14   Historical Provider, MD  isosorbide mononitrate (IMDUR) 60 MG 24 hr tablet TAKE ONE TABLET BY MOUTH DAILY. 02/04/15   Marletta Lor, MD  losartan (COZAAR) 50 MG tablet TAKE ONE TABLET BY MOUTH ONCE DAILY 10/10/13   Marletta Lor, MD  meclizine (ANTIVERT) 25 MG tablet TAKE ONE TABLET BY MOUTH THREE TIMES DAILY AS NEEDED FOR DIZZINESS Patient not taking: Reported on 06/21/2015 06/15/14   Marletta Lor, MD  nebivolol (BYSTOLIC) 10 MG tablet Take 0.5 tablets (5 mg total) by mouth daily. 07/25/15   Laurey Morale, MD  nitroGLYCERIN (NITROSTAT) 0.4 MG SL tablet Place 1 tablet (0.4 mg total) under the tongue every 5 (five) minutes as needed for chest pain. 02/10/13   Ricard Dillon, MD  omeprazole (PRILOSEC) 10 MG capsule Take 10 mg by mouth daily.    Historical Provider, MD  oxyCODONE-acetaminophen (PERCOCET) 10-325 MG tablet Take 1 tablet by mouth 2 (two) times daily as needed. 07/26/15   Marletta Lor, MD  rOPINIRole (REQUIP) 2 MG tablet Take 1 tablet (2 mg total) by mouth at bedtime. 03/25/15   Marletta Lor, MD  sulfamethoxazole-trimethoprim (BACTRIM DS,SEPTRA DS) 800-160 MG tablet Take 1 tablet by mouth 2 (two) times daily. 06/21/15   Marletta Lor, MD  tamsulosin (FLOMAX) 0.4 MG CAPS capsule Take 1 capsule (0.4 mg total) by mouth daily. 06/21/15   Marletta Lor, MD  venlafaxine (EFFEXOR) 75 MG tablet Take 1 tablet (75 mg total) by mouth daily. 10/22/14   Marletta Lor, MD   BP 128/61 mmHg  Pulse 78  Temp(Src) 98.5 F (36.9 C) (Oral)  Resp 21  Ht 5\' 8"  (1.727 m)  Wt 189 lb (85.73 kg)  BMI 28.74 kg/m2  SpO2 95% Physical Exam CONSTITUTIONAL: Well developed/well nourished HEAD: Normocephalic/atraumatic EYES: EOMI ENMT: Mucous membranes moist NECK: supple no meningeal signs SPINE/BACK:entire spine nontender CV: S1/S2 noted, no murmurs/rubs/gallops noted LUNGS: Lungs are clear to auscultation  bilaterally, no apparent distress ABDOMEN: soft, nontender, no rebound or guarding, bowel sounds noted throughout abdomen GU:no cva tenderness; right testicle enlarged and focally tender, no overlying erythema. Scrotal implant noted, non-tender RECTAL: Prostate enlarged but nontender, no masses noted. Nurse chaperone present  NEURO: Pt is awake/alert/appropriate, moves all extremitiesx4.  No facial droop.   EXTREMITIES: pulses normal/equal, full ROM SKIN: warm, color normal PSYCH: no abnormalities of mood noted, alert and oriented to situation  ED Course  Procedures  Medications  cefTRIAXone (ROCEPHIN) injection 250 mg (250 mg Intramuscular Given 08/08/15 0347)  levofloxacin (LEVAQUIN) tablet 750 mg (750 mg Oral Given 08/08/15 0347)     DIAGNOSTIC STUDIES:  Oxygen Saturation is 99% on RA, normal by my interpretation.    COORDINATION OF CARE:  1:47 AM Discussed treatment plan with pt at bedside and pt agreed to plan. Pt with orchitis No signs of acute torsion per radiology He is well appearing/nontoxic, not septic appearing He reports restarting ABX several days ago, and it is BID dosing Advised him to stop this med (he is unsure of name and pharmacy closed) Will start levaquin (dosing discussed with pharmacy who recommends 750mg  daily for 10 days) F/u with urology in next week  Labs Review Labs Reviewed  URINALYSIS, ROUTINE W REFLEX MICROSCOPIC (NOT AT [facility]) - Abnormal; Notable for the following:    Color, Urine AMBER (*)    APPearance TURBID (*)    Hgb urine dipstick SMALL (*)    Bilirubin Urine LARGE (*)    Ketones, ur 15 (*)    Protein, ur 100 (*)    Leukocytes, UA LARGE (*)    All other components within normal limits  CBC WITH DIFFERENTIAL/PLATELET - Abnormal; Notable for the following:    WBC 14.1 (*)    RBC 3.96 (*)    Hemoglobin 11.8 (*)    HCT 35.3 (*)    Neutro Abs 11.2 (*)    All other components within normal limits  COMPREHENSIVE METABOLIC PANEL -  Abnormal; Notable for the following:    Glucose, Bld 124 (*)    BUN 23 (*)    Creatinine, Ser 1.42 (*)    ALT 15 (*)    GFR calc non Af Amer 47 (*)    GFR calc Af Amer 55 (*)    All other components within normal limits  URINE MICROSCOPIC-ADD ON - Abnormal; Notable for the following:    Squamous Epithelial / LPF 0-5 (*)    Bacteria, UA FEW (*)    All other components within normal limits    Imaging Review US Scrotum  08/08/2015  CLINICAL DATA:  Worsening RIGHT testicular pain and swelling beginning last week. EXAM: SCROTAL ULTRASOUND DOPPLER ULTRASOUND OF THE TESTICLES TECHNIQUE: Complete ultrasound examination of the testicles, epididymis, and other scrotal structures was performed. Color and spectral Doppler ultrasound were also utilized to evaluate blood flow to the testicles. COMPARISON:  None. FINDINGS: Right testicle Measurements: 4.6 x 3.2 x 3.3 cm. No mass or microlithiasis visualized. Mild hyperemia. Left testicle Measurements: 4.2 x 2.4 x 2.6 cm. No mass or microlithiasis visualized. Right epididymis: Mildly enlarged, hyperemic and heterogeneous. 10 mm anechoic cyst within or adjacent to the epididymis. Left epididymis: Normal in size and appearance. Multiple anechoic cysts measure up to 7 mm. Hydrocele:  Small bilateral hydroceles. Varicocele:  None visualized. Pulsed Doppler interrogation of both testes demonstrates normal low resistance arterial and venous waveforms bilaterally. IMPRESSION: Acute RIGHT epididymitis orchitis. Small bilateral hydroceles. Electronically Signed   By: Elon Alas M.D.   On: 08/08/2015 02:54   [place] Art/ven Flow Abd Pelv Doppler  08/08/2015  CLINICAL DATA:  Worsening RIGHT testicular pain and swelling beginning last week. EXAM: SCROTAL ULTRASOUND DOPPLER ULTRASOUND OF THE TESTICLES TECHNIQUE: Complete ultrasound examination of the testicles, epididymis, and other scrotal structures was performed. Color and spectral Doppler ultrasound were also utilized  to evaluate blood flow to the testicles. COMPARISON:  None. FINDINGS: Right testicle Measurements: 4.6 x 3.2 x 3.3 cm. No mass or microlithiasis visualized. Mild hyperemia. Left testicle Measurements: 4.2 x 2.4 x 2.6  cm. No mass or microlithiasis visualized. Right epididymis: Mildly enlarged, hyperemic and heterogeneous. 10 mm anechoic cyst within or adjacent to the epididymis. Left epididymis: Normal in size and appearance. Multiple anechoic cysts measure up to 7 mm. Hydrocele:  Small bilateral hydroceles. Varicocele:  None visualized. Pulsed Doppler interrogation of both testes demonstrates normal low resistance arterial and venous waveforms bilaterally. IMPRESSION: Acute RIGHT epididymitis orchitis. Small bilateral hydroceles. Electronically Signed   By: Elon Alas M.D.   On: 08/08/2015 02:54   I have personally reviewed and evaluated these lab results as part of my medical decision-making.    MDM   Final diagnoses:  Pain  Orchitis    Nursing notes including past medical history and social history reviewed and considered in documentation Labs/vital reviewed myself and considered during evaluation   I personally performed the services described in this documentation, which was scribed in my presence. The recorded information has been reviewed and is accurate.       Ripley Fraise, MD 08/08/15 [phone]

## 2015-08-16 ENCOUNTER — Ambulatory Visit: Payer: Medicare Other

## 2015-08-23 ENCOUNTER — Ambulatory Visit (INDEPENDENT_AMBULATORY_CARE_PROVIDER_SITE_OTHER): Payer: Medicare Other | Admitting: *Deleted

## 2015-08-23 ENCOUNTER — Telehealth: Payer: Self-pay | Admitting: Internal Medicine

## 2015-08-23 DIAGNOSIS — N453 Epididymo-orchitis: Secondary | ICD-10-CM | POA: Diagnosis not present

## 2015-08-23 DIAGNOSIS — E538 Deficiency of other specified B group vitamins: Secondary | ICD-10-CM

## 2015-08-23 MED ORDER — CYANOCOBALAMIN 1000 MCG/ML IJ SOLN
1000.0000 ug | Freq: Once | INTRAMUSCULAR | Status: AC
  Administered 2015-08-23: 1000 ug via INTRAMUSCULAR

## 2015-08-23 MED ORDER — OXYCODONE-ACETAMINOPHEN 10-325 MG PO TABS: 1.0000 | ORAL_TABLET | Freq: Two times a day (BID) | ORAL | 0 refills | Status: DC | PRN

## 2015-08-23 NOTE — Telephone Encounter (Signed)
Left voicemail letting patient know Rx ready for pick up at the front.

## 2015-08-23 NOTE — Telephone Encounter (Signed)
Okay to refill? 

## 2015-08-23 NOTE — Telephone Encounter (Signed)
Rx printed

## 2015-08-23 NOTE — Telephone Encounter (Signed)
ok 

## 2015-08-23 NOTE — Telephone Encounter (Signed)
Pt need new Rx for Oxycodone  **Pt would like to pick up when he gets his inj at 11:45 today

## 2015-09-05 ENCOUNTER — Encounter: Payer: Medicare Other | Attending: Physical Medicine & Rehabilitation | Admitting: Physical Medicine & Rehabilitation

## 2015-09-05 ENCOUNTER — Encounter: Payer: Self-pay | Admitting: Physical Medicine & Rehabilitation

## 2015-09-05 VITALS — BP 154/87 | HR 84 | Resp 17

## 2015-09-05 DIAGNOSIS — K219 Gastro-esophageal reflux disease without esophagitis: Secondary | ICD-10-CM | POA: Insufficient documentation

## 2015-09-05 DIAGNOSIS — F329 Major depressive disorder, single episode, unspecified: Secondary | ICD-10-CM | POA: Insufficient documentation

## 2015-09-05 DIAGNOSIS — N189 Chronic kidney disease, unspecified: Secondary | ICD-10-CM | POA: Diagnosis not present

## 2015-09-05 DIAGNOSIS — Z951 Presence of aortocoronary bypass graft: Secondary | ICD-10-CM | POA: Diagnosis not present

## 2015-09-05 DIAGNOSIS — G8929 Other chronic pain: Secondary | ICD-10-CM | POA: Diagnosis not present

## 2015-09-05 DIAGNOSIS — M159 Polyosteoarthritis, unspecified: Secondary | ICD-10-CM | POA: Diagnosis not present

## 2015-09-05 DIAGNOSIS — C801 Malignant (primary) neoplasm, unspecified: Secondary | ICD-10-CM | POA: Diagnosis not present

## 2015-09-05 DIAGNOSIS — Z5181 Encounter for therapeutic drug level monitoring: Secondary | ICD-10-CM | POA: Diagnosis not present

## 2015-09-05 DIAGNOSIS — E785 Hyperlipidemia, unspecified: Secondary | ICD-10-CM | POA: Diagnosis not present

## 2015-09-05 DIAGNOSIS — M545 Low back pain: Secondary | ICD-10-CM | POA: Insufficient documentation

## 2015-09-05 DIAGNOSIS — I129 Hypertensive chronic kidney disease with stage 1 through stage 4 chronic kidney disease, or unspecified chronic kidney disease: Secondary | ICD-10-CM | POA: Diagnosis not present

## 2015-09-05 DIAGNOSIS — G479 Sleep disorder, unspecified: Secondary | ICD-10-CM | POA: Diagnosis not present

## 2015-09-05 DIAGNOSIS — Z79899 Other long term (current) drug therapy: Secondary | ICD-10-CM

## 2015-09-05 DIAGNOSIS — M25562 Pain in left knee: Secondary | ICD-10-CM | POA: Insufficient documentation

## 2015-09-05 MED ORDER — DICLOFENAC SODIUM 1 % TD GEL: 2.0000 g | Freq: Four times a day (QID) | TRANSDERMAL | Status: DC

## 2015-09-05 MED ORDER — GABAPENTIN 300 MG PO CAPS: 300.0000 mg | ORAL_CAPSULE | Freq: Three times a day (TID) | ORAL | 1 refills | Status: DC

## 2015-09-05 NOTE — Addendum Note (Signed)
Addended by: Geryl Rankins D on: 09/05/2015 02:28 PM   Modules accepted: Orders

## 2015-09-05 NOTE — Progress Notes (Signed)
Subjective:    Patient ID: [name], male    DOB: Jul 19, 1940, 75 y.o.   MRN: [id number]  HPI  75 y/o male with pmh of HTN, HLD, GERD, RLS, CKD, CABG, Depression, generalized OA, bladder CA, low back pain presents with left knee pain. Located mainly on the medial aspect. Pain started ~2005 year ago and getting progressively worse.  He denies inciting event.  Rest improves the pain.  Walking exacerbated the pain.  Sharp pain.  Non-radiating.  Constant pain.  Today, severity 5/10.  He has associated weakness.  He has had arthroscopy ~10 years.  He has had steroid injections, which only lasted a short time, last one ~2014.  Denies falls.  Pain prevents from doing enjoyable activities like fishing, hunting, gardening.  He has tried Ambulance person which helps.  He recently had an xray of his knee at [facility].  He also had an MRI of his shoulder, at [facility], which showed bursitis ~2014.   Pain Inventory Average Pain 8 Pain Right Now 9 My pain is other  In the last 24 hours, has pain interfered with the following? General activity 8 Relation with others 8 Enjoyment of life 9 What TIME of day is your pain at its worst? morning Sleep (in general) Poor  Pain is worse with: walking, bending and sitting Pain improves with: NA Relief from Meds: 4  Mobility walk without assistance ability to climb steps?  no do you drive?  yes Do you have any goals in this area?  yes  Function retired Do you have any goals in this area?  yes  Neuro/Psych bladder control problems weakness numbness tremor tingling trouble walking spasms anxiety  Prior Studies initial visit   Physicians involved in your care Primary care .   Family History  Problem Relation Age of Onset  . Cancer Mother     melanoma  . Stroke Father   . Hypertension Father   . Coronary artery disease    . Colon cancer Neg Hx   . Esophageal cancer Neg Hx   . Stomach cancer Neg Hx   . Rectal cancer Neg Hx    Social History     Social History  . Marital status: Married    Spouse name: N/A  . Number of children: N/A  . Years of education: N/A   Social History Main Topics  . Smoking status: Former Smoker    Quit date: 03/30/1976  . Smokeless tobacco: Never Used     Comment: quit in 1978  . Alcohol use No  . Drug use: No  . Sexual activity: Yes   Other Topics Concern  . None   Social History Narrative  . None   Past Surgical History:  Procedure Laterality Date  . bladder cancer    . CERVICAL DISCECTOMY     ACDF  . COLONOSCOPY  11/17/2005   normal   . CORONARY ARTERY BYPASS GRAFT     x4  . ESOPHAGOGASTRODUODENOSCOPY  04/29/2010  . KNEE ARTHROSCOPY    . LUMBAR LAMINECTOMY     and fusion  . POPLITEAL SYNOVIAL CYST EXCISION     Past Medical History:  Diagnosis Date  . Allergy   . Anemia   . B12 deficiency anemia   . Blood transfusion without reported diagnosis   . CAD (coronary artery disease)   . Cancer ([place])    bladder cancer 8 times  . Colon polyps   . COPD (chronic obstructive pulmonary disease) ([place])   .  Depression   . Esophagitis   . Esophagus, Barrett's   . GERD (gastroesophageal reflux disease)   . History of bladder cancer   . Hyperlipidemia   . Hypertension   . Hypothyroidism   . Localized osteoarthrosis, lower leg   . Low back pain   . Malaise and fatigue   . Stenosis of esophagus    BP (!) 154/87   Pulse 84   Resp 17   SpO2 96%   Opioid Risk Score:   Fall Risk Score:  `1  Depression screen PHQ 2/9  Depression screen [facility] 2/9 09/05/2015 06/07/2015 06/06/2014 05/29/2013 02/15/2012 07/17/2011  Decreased Interest 2 0 0 0 2 1  Down, Depressed, Hopeless 1 0 0 0 1 1  PHQ - 2 Score 3 0 0 0 3 2  Altered sleeping 1 - - - 0 -  Tired, decreased energy 2 - - - 1 -  Change in appetite 1 - - - 0 -  Feeling bad or failure about yourself  1 - - - 1 -  Trouble concentrating 2 - - - 1 -  Moving slowly or fidgety/restless 2 - - - 0 -  Suicidal thoughts 1 - - - 0 -  PHQ-9 Score 13  - - - 6 -  Difficult doing work/chores Very difficult - - - - -    Review of Systems  Constitutional: Positive for chills and fever.  Respiratory: Positive for apnea and shortness of breath.   Gastrointestinal: Positive for nausea.  Genitourinary: Positive for dysuria.  Musculoskeletal: Positive for gait problem.  Skin: Positive for rash.  Neurological: Positive for weakness.       Tingling Spasms   Psychiatric/Behavioral: The patient is nervous/anxious.   All other systems reviewed and are negative.     Objective:   Physical Exam Gen: NAD. Vital signs reviewed HENT: Normocephalic, Atraumatic Eyes: EOMI, Conj WNL Cardio: S1, S2 normal, RRR Pulm: B/l clear to auscultation.  Effort normal Abd: Soft, non-distended, non-tender, BS+ MSK:  Gait slightly antalgic, including heel/toe walking.   No TTP.    No edema.   Neg grind test  Neg McMurrey's Neuro: CN II-XII grossly intact.    Sensation intact to light touch in all LE dermatomes, except feet (chronic)  Reflexes 1+ throughout  Strength  5/5 in all LE myotomes Skin: Warm and Dry    Assessment & Plan:  75 y/o male with pmh of HTN, HLD, GERD, CKD, CABG, Depression, bladder CA, generalized OA, RLS, low back pain presents with left knee pain.   1. Chronic left knee pain  Due to CAD, will avoid NSAIDs  Pt has tried bracing without benefit  NCTrack reviewed  Will obtain films from Ortho - based on results with consider Hyaluronic acid  Will order Voltaren gel, will consider lidoderm patch/compound medication in future if necessary  Plan to order PT for generalized pain and OA, with TENS, strengthening and quad strengthening and aquatic therapy, however, pt states he does not have time for therapy and wants to hold off on it for the time being  Cont ice/heat   Will order gabapentin 300 qhs  Will have pt sign pain contract and UDS  Will consider Biowave in future  UDS performed, no contract signed on this visit  2.  Generalized OA  See #1  3. Sleep disturbance  Will order gabapentin 300 qhs

## 2015-09-09 ENCOUNTER — Encounter: Payer: Self-pay | Admitting: Gastroenterology

## 2015-09-15 LAB — TOXASSURE SELECT,+ANTIDEPR,UR: PDF: 0

## 2015-09-17 NOTE — Progress Notes (Signed)
Urine drug screen for this encounter is consistent for prescribed medications.   

## 2015-09-25 ENCOUNTER — Other Ambulatory Visit: Payer: Self-pay | Admitting: Internal Medicine

## 2015-09-30 ENCOUNTER — Other Ambulatory Visit: Payer: Self-pay | Admitting: Internal Medicine

## 2015-09-30 ENCOUNTER — Ambulatory Visit (INDEPENDENT_AMBULATORY_CARE_PROVIDER_SITE_OTHER): Payer: Medicare Other | Admitting: *Deleted

## 2015-09-30 DIAGNOSIS — E538 Deficiency of other specified B group vitamins: Secondary | ICD-10-CM | POA: Diagnosis not present

## 2015-09-30 DIAGNOSIS — Z23 Encounter for immunization: Secondary | ICD-10-CM | POA: Diagnosis not present

## 2015-09-30 MED ORDER — OXYCODONE-ACETAMINOPHEN 10-325 MG PO TABS: 1.0000 | ORAL_TABLET | Freq: Two times a day (BID) | ORAL | 0 refills | Status: DC | PRN

## 2015-09-30 MED ORDER — CYANOCOBALAMIN 1000 MCG/ML IJ SOLN
1000.0000 ug | Freq: Once | INTRAMUSCULAR | Status: AC
  Administered 2015-09-30: 1000 ug via INTRAMUSCULAR

## 2015-09-30 NOTE — Telephone Encounter (Signed)
Left message on voicemail Rx ready for pickup, will be at the front desk. Rx printed and signed. 

## 2015-09-30 NOTE — Telephone Encounter (Signed)
Pt request refill  oxyCODONE-acetaminophen (PERCOCET) 10-325 MG tablet  Pt getting b12 at 11:45 and would l ke to pick up rx at that time

## 2015-10-03 ENCOUNTER — Encounter: Payer: Self-pay | Admitting: Physical Medicine & Rehabilitation

## 2015-10-03 ENCOUNTER — Encounter: Payer: Medicare Other | Attending: Physical Medicine & Rehabilitation | Admitting: Physical Medicine & Rehabilitation

## 2015-10-03 ENCOUNTER — Ambulatory Visit (HOSPITAL_COMMUNITY)
Admission: RE | Admit: 2015-10-03 | Discharge: 2015-10-03 | Disposition: A | Discharge status: Home / Self Care | Payer: Medicare Other | Source: Ambulatory Visit | Attending: Physical Medicine & Rehabilitation | Admitting: Physical Medicine & Rehabilitation

## 2015-10-03 VITALS — BP 156/87 | HR 86 | Resp 16

## 2015-10-03 DIAGNOSIS — M159 Polyosteoarthritis, unspecified: Secondary | ICD-10-CM | POA: Diagnosis not present

## 2015-10-03 DIAGNOSIS — Z951 Presence of aortocoronary bypass graft: Secondary | ICD-10-CM | POA: Diagnosis not present

## 2015-10-03 DIAGNOSIS — G8929 Other chronic pain: Secondary | ICD-10-CM | POA: Insufficient documentation

## 2015-10-03 DIAGNOSIS — F329 Major depressive disorder, single episode, unspecified: Secondary | ICD-10-CM | POA: Insufficient documentation

## 2015-10-03 DIAGNOSIS — M7582 Other shoulder lesions, left shoulder: Secondary | ICD-10-CM

## 2015-10-03 DIAGNOSIS — M25562 Pain in left knee: Secondary | ICD-10-CM | POA: Diagnosis not present

## 2015-10-03 DIAGNOSIS — Z5181 Encounter for therapeutic drug level monitoring: Secondary | ICD-10-CM

## 2015-10-03 DIAGNOSIS — M170 Bilateral primary osteoarthritis of knee: Secondary | ICD-10-CM | POA: Diagnosis not present

## 2015-10-03 DIAGNOSIS — E785 Hyperlipidemia, unspecified: Secondary | ICD-10-CM | POA: Diagnosis not present

## 2015-10-03 DIAGNOSIS — N189 Chronic kidney disease, unspecified: Secondary | ICD-10-CM | POA: Insufficient documentation

## 2015-10-03 DIAGNOSIS — K219 Gastro-esophageal reflux disease without esophagitis: Secondary | ICD-10-CM | POA: Insufficient documentation

## 2015-10-03 DIAGNOSIS — M7552 Bursitis of left shoulder: Secondary | ICD-10-CM

## 2015-10-03 DIAGNOSIS — C801 Malignant (primary) neoplasm, unspecified: Secondary | ICD-10-CM | POA: Diagnosis not present

## 2015-10-03 DIAGNOSIS — M179 Osteoarthritis of knee, unspecified: Secondary | ICD-10-CM | POA: Diagnosis not present

## 2015-10-03 DIAGNOSIS — I129 Hypertensive chronic kidney disease with stage 1 through stage 4 chronic kidney disease, or unspecified chronic kidney disease: Secondary | ICD-10-CM | POA: Insufficient documentation

## 2015-10-03 DIAGNOSIS — M545 Low back pain: Secondary | ICD-10-CM | POA: Insufficient documentation

## 2015-10-03 DIAGNOSIS — G479 Sleep disorder, unspecified: Secondary | ICD-10-CM | POA: Insufficient documentation

## 2015-10-03 DIAGNOSIS — R269 Unspecified abnormalities of gait and mobility: Secondary | ICD-10-CM

## 2015-10-03 MED ORDER — GABAPENTIN 300 MG PO CAPS: 300.0000 mg | ORAL_CAPSULE | Freq: Three times a day (TID) | ORAL | 1 refills | Status: DC

## 2015-10-03 MED ORDER — DULOXETINE HCL 30 MG PO CPEP: 30.0000 mg | ORAL_CAPSULE | Freq: Every day | ORAL | 1 refills | Status: DC

## 2015-10-03 NOTE — Progress Notes (Addendum)
Subjective:    Patient ID: [name], male    DOB: 01/18/1941, 75 y.o.   MRN: [id number]  HPI  75 y/o male with pmh of HTN, HLD, GERD, RLS, CKD, CABG, Depression, generalized OA, bladder CA, low back pain presents with left knee pain. Located mainly on the medial aspect. Pain started ~2005 year ago and getting progressively worse.  He denies inciting event.  Rest improves the pain.  Walking exacerbated the pain.  Sharp pain.  Non-radiating.  Constant pain.  He has associated weakness.  He has had arthroscopy ~10 years.  He has had steroid injections, which only lasted a short time, last one ~2014.  Pain prevents from doing enjoyable activities like fishing, hunting, gardening.  He has tried Ambulance person which helps.  He recently had an xray of his knee at [facility].  He also had an MRI of his shoulder, at [facility], which showed bursitis ~2014.   Last clinic visit 09/05/15.  Pt did not obtain films from Ortho.  He states he never received the prescription for the voltaren gel.  The Gabapentin helped.  Since that time, he states he hurts all over.  It gets worse as the weather gets colder.  But otherwise, he had improved with the changes.  Pt also complains of near falls with forward lean.  Pain Inventory Average Pain 8 Pain Right Now 6 My pain is sharp and aching  In the last 24 hours, has pain interfered with the following? General activity 8 Relation with others 5 Enjoyment of life 6 What TIME of day is your pain at its worst? evening Sleep (in general) Poor  Pain is worse with: walking, bending and standing Pain improves with: rest and medication Relief from Meds: 6  Mobility walk without assistance ability to climb steps?  yes do you drive?  yes Do you have any goals in this area?  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   yes x-rays  Physicians involved in your care Any changes since last visit?  no   Family History  Problem Relation Age of Onset  . Cancer Mother     melanoma  . Stroke Father   . Hypertension Father   . Coronary artery disease    . Colon cancer Neg Hx   . Esophageal cancer Neg Hx   . Stomach cancer Neg Hx   . Rectal cancer Neg Hx    Social History   Social History  . Marital status: Married    Spouse name: N/A  . Number of children: N/A  . Years of education: N/A   Social History Main Topics  . Smoking status: Former Smoker    Quit date: 03/30/1976  . Smokeless tobacco: Never Used     Comment: quit in 1978  . Alcohol use No  . Drug use: No  . Sexual activity: Yes   Other Topics Concern  . None   Social History Narrative  . None   Past Surgical History:  Procedure Laterality Date  . bladder cancer    . CERVICAL DISCECTOMY     ACDF  . COLONOSCOPY  11/17/2005   normal   . CORONARY ARTERY BYPASS GRAFT     x4  . ESOPHAGOGASTRODUODENOSCOPY  04/29/2010  . KNEE ARTHROSCOPY    . LUMBAR LAMINECTOMY     and fusion  . POPLITEAL  SYNOVIAL CYST EXCISION     Past Medical History:  Diagnosis Date  . Allergy   . Anemia   . B12 deficiency anemia   . Blood transfusion without reported diagnosis   . CAD (coronary artery disease)   . Cancer ([place])    bladder cancer 8 times  . Colon polyps   . COPD (chronic obstructive pulmonary disease) ([place])   . Depression   . Esophagitis   . Esophagus, Barrett's   . GERD (gastroesophageal reflux disease)   . History of bladder cancer   . Hyperlipidemia   . Hypertension   . Hypothyroidism   . Localized osteoarthrosis, lower leg   . Low back pain   . Malaise and fatigue   . Stenosis of esophagus    BP (!) 156/87 (BP Location: Left Arm, Patient Position: Sitting, Cuff Size: Large)   Pulse 86   Resp 16   SpO2 94%   Opioid Risk Score:   Fall Risk Score:  `1  Depression screen PHQ 2/9  Depression screen [facility] 2/9 09/05/2015  06/07/2015 06/06/2014 05/29/2013 02/15/2012 07/17/2011  Decreased Interest 2 0 0 0 2 1  Down, Depressed, Hopeless 1 0 0 0 1 1  PHQ - 2 Score 3 0 0 0 3 2  Altered sleeping 1 - - - 0 -  Tired, decreased energy 2 - - - 1 -  Change in appetite 1 - - - 0 -  Feeling bad or failure about yourself  1 - - - 1 -  Trouble concentrating 2 - - - 1 -  Moving slowly or fidgety/restless 2 - - - 0 -  Suicidal thoughts 1 - - - 0 -  PHQ-9 Score 13 - - - 6 -  Difficult doing work/chores Very difficult - - - - -    Review of Systems  Constitutional: Positive for chills, diaphoresis and fever.  HENT: Negative.   Eyes: Negative.   Respiratory: Positive for apnea and shortness of breath.   Gastrointestinal: Positive for abdominal pain.  Genitourinary: Positive for dysuria.  Musculoskeletal: Positive for arthralgias, back pain and gait problem.  Skin: Positive for rash.  Allergic/Immunologic: Negative.   Neurological: Positive for tremors and weakness.       Tingling Spasms   Psychiatric/Behavioral: The patient is nervous/anxious.   All other systems reviewed and are negative.     Objective:   Physical Exam Gen: NAD. Vital signs reviewed HENT: Normocephalic, Atraumatic Eyes: EOMI, Conj WNL Cardio: S1, S2 normal, RRR Pulm: B/l clear to auscultation.  Effort normal Abd: Soft, non-distended, non-tender, BS+ MSK:  Gait slightly antalgic, including heel/toe walking.   No TTP.    No edema.   Neg grind test  Neg McMurrey's Neuro: CN II-XII grossly intact.    Sensation intact to light touch in all LE dermatomes, except feet (chronic)  Reflexes 1+ throughout  Strength  5/5 in all LE myotomes Skin: Warm and Dry    Assessment & Plan:  75 y/o male with pmh of HTN, HLD, GERD, CKD, CABG, Depression, bladder CA, generalized OA, RLS, low back pain presents with left knee pain.   1. Chronic left knee pain  Due to CAD, will avoid NSAIDs  Pt has tried bracing without benefit  Cont ice/heat   Signed pain  contract and UDS  Will order PT for generalized pain and OA, with TENS, strengthening and quad strengthening and aquatic therapy  Will increase gabapentin 300 to TID  UDS performed,contract signed on this visit  Will  obtain films from Ortho, still pending - based on results with consider Hyaluronic acid. Unclear where pt obtained these, will order xray of knee  Instructed to obtain Voltaren gel, will consider lidoderm patch/compound medication in future if necessary  Will order Cymbalta 30mg  daily, consider increase to 60 on next visit  Will consider Biowave in future  2. Generalized OA  See #1  3. Sleep disturbance  Cont gabapentin 300  4. Abnormality of gait  Forward lean with near falls  PT ordered  5. Left shoulder  MRI reviewed 06/2014 showing degenerative changes, rotator cuff pathology, and bursitis

## 2015-10-03 NOTE — Addendum Note (Signed)
Addended by: Delice Lesch A on: 10/03/2015 11:13 AM   Modules accepted: Orders

## 2015-10-03 NOTE — Patient Instructions (Signed)
Please obtain Voltaren gel

## 2015-10-07 ENCOUNTER — Telehealth: Payer: Self-pay | Admitting: *Deleted

## 2015-10-07 NOTE — Telephone Encounter (Signed)
We received a fax from [facility] stating that 'Mr. Talkington says Dr. Posey Pronto was going to send in a Rx for a cream (presumably Voltaren Gel).  They have not received'. Please advise

## 2015-10-07 NOTE — Telephone Encounter (Signed)
I ordered the medication for him on 09/05/15, but he never picked it up.  I did not order it again, because I assume it is still on file.  If we need to prescribe it again, we can, but I'm not sure why we would need to.  Thanks.

## 2015-10-09 MED ORDER — DICLOFENAC SODIUM 1 % TD GEL: 2.0000 g | Freq: Four times a day (QID) | TRANSDERMAL | 0 refills | Status: DC

## 2015-10-09 NOTE — Telephone Encounter (Signed)
Original order was for clinic administration

## 2015-10-11 ENCOUNTER — Telehealth: Payer: Self-pay | Admitting: Physical Medicine & Rehabilitation

## 2015-10-11 NOTE — Telephone Encounter (Signed)
Patient called about his prescription Voltaren Gel.  I have asked Yvone Neu and he is working on the prior authorization with [facility] [employer].  I have left patient a message letting him know that we are working on this.

## 2015-10-24 ENCOUNTER — Ambulatory Visit: Payer: Medicare Other | Admitting: Physical Medicine & Rehabilitation

## 2015-10-28 ENCOUNTER — Telehealth: Payer: Self-pay | Admitting: Internal Medicine

## 2015-10-28 ENCOUNTER — Ambulatory Visit (INDEPENDENT_AMBULATORY_CARE_PROVIDER_SITE_OTHER): Payer: Medicare Other | Admitting: *Deleted

## 2015-10-28 DIAGNOSIS — D518 Other vitamin B12 deficiency anemias: Secondary | ICD-10-CM

## 2015-10-28 DIAGNOSIS — E538 Deficiency of other specified B group vitamins: Secondary | ICD-10-CM

## 2015-10-28 MED ORDER — OXYCODONE-ACETAMINOPHEN 10-325 MG PO TABS: 1.0000 | ORAL_TABLET | Freq: Two times a day (BID) | ORAL | 0 refills | Status: DC | PRN

## 2015-10-28 MED ORDER — CYANOCOBALAMIN 1000 MCG/ML IJ SOLN
1000.0000 ug | Freq: Once | INTRAMUSCULAR | Status: AC
  Administered 2015-10-28: 1000 ug via INTRAMUSCULAR

## 2015-10-28 NOTE — Telephone Encounter (Signed)
Pt need new Rx for Oxycodone   Pt aware of the three day refill protocol

## 2015-10-28 NOTE — Telephone Encounter (Signed)
Pt here to pickup Rx. Rx printed and signed by Dr. Sherren Mocha.

## 2015-10-29 ENCOUNTER — Ambulatory Visit: Payer: Medicare Other | Admitting: *Deleted

## 2015-10-30 ENCOUNTER — Encounter: Payer: Self-pay | Admitting: Physical Medicine & Rehabilitation

## 2015-10-30 ENCOUNTER — Encounter: Payer: Medicare Other | Attending: Physical Medicine & Rehabilitation | Admitting: Physical Medicine & Rehabilitation

## 2015-10-30 VITALS — BP 163/79 | HR 78 | Resp 16

## 2015-10-30 DIAGNOSIS — K219 Gastro-esophageal reflux disease without esophagitis: Secondary | ICD-10-CM | POA: Insufficient documentation

## 2015-10-30 DIAGNOSIS — G8929 Other chronic pain: Secondary | ICD-10-CM | POA: Insufficient documentation

## 2015-10-30 DIAGNOSIS — M7552 Bursitis of left shoulder: Secondary | ICD-10-CM | POA: Diagnosis not present

## 2015-10-30 DIAGNOSIS — G479 Sleep disorder, unspecified: Secondary | ICD-10-CM | POA: Insufficient documentation

## 2015-10-30 DIAGNOSIS — N189 Chronic kidney disease, unspecified: Secondary | ICD-10-CM | POA: Insufficient documentation

## 2015-10-30 DIAGNOSIS — E785 Hyperlipidemia, unspecified: Secondary | ICD-10-CM | POA: Insufficient documentation

## 2015-10-30 DIAGNOSIS — M545 Low back pain: Secondary | ICD-10-CM | POA: Insufficient documentation

## 2015-10-30 DIAGNOSIS — I129 Hypertensive chronic kidney disease with stage 1 through stage 4 chronic kidney disease, or unspecified chronic kidney disease: Secondary | ICD-10-CM | POA: Insufficient documentation

## 2015-10-30 DIAGNOSIS — C801 Malignant (primary) neoplasm, unspecified: Secondary | ICD-10-CM | POA: Insufficient documentation

## 2015-10-30 DIAGNOSIS — M25562 Pain in left knee: Secondary | ICD-10-CM | POA: Insufficient documentation

## 2015-10-30 DIAGNOSIS — Z951 Presence of aortocoronary bypass graft: Secondary | ICD-10-CM | POA: Insufficient documentation

## 2015-10-30 DIAGNOSIS — M159 Polyosteoarthritis, unspecified: Secondary | ICD-10-CM | POA: Insufficient documentation

## 2015-10-30 DIAGNOSIS — F329 Major depressive disorder, single episode, unspecified: Secondary | ICD-10-CM | POA: Insufficient documentation

## 2015-10-30 NOTE — Progress Notes (Signed)
Subacromial Shoulder injection:   Indication: Shoulder pain not relieved by medication management and other conservative care.  Informed consent was obtained after describing risks and benefits of the procedure with the patient, this includes bleeding, bruising, infection and medication side effects. The patient wishes to proceed and has given written consent. Patient was placed in a seated position. The left shoulder was marked and prepped with betadine in the subacromial area. A 25-gauge 1-1/2 inch needle was inserted into the subacromial area. After negative draw back for blood, a solution containing 1 mL of 6 mg per ML celestone and 4 mL of 1% lidocaine was injected. A band aid was applied. The patient tolerated the procedure well. Post procedure instructions were given.

## 2015-11-13 ENCOUNTER — Encounter (HOSPITAL_BASED_OUTPATIENT_CLINIC_OR_DEPARTMENT_OTHER): Payer: Medicare Other | Admitting: Physical Medicine & Rehabilitation

## 2015-11-13 ENCOUNTER — Encounter: Payer: Self-pay | Admitting: Physical Medicine & Rehabilitation

## 2015-11-13 VITALS — BP 149/87 | HR 76

## 2015-11-13 DIAGNOSIS — M25562 Pain in left knee: Secondary | ICD-10-CM | POA: Diagnosis not present

## 2015-11-13 DIAGNOSIS — E785 Hyperlipidemia, unspecified: Secondary | ICD-10-CM | POA: Diagnosis not present

## 2015-11-13 DIAGNOSIS — R269 Unspecified abnormalities of gait and mobility: Secondary | ICD-10-CM | POA: Diagnosis not present

## 2015-11-13 DIAGNOSIS — M545 Low back pain: Secondary | ICD-10-CM | POA: Diagnosis not present

## 2015-11-13 DIAGNOSIS — M159 Polyosteoarthritis, unspecified: Secondary | ICD-10-CM | POA: Diagnosis not present

## 2015-11-13 DIAGNOSIS — K219 Gastro-esophageal reflux disease without esophagitis: Secondary | ICD-10-CM | POA: Diagnosis not present

## 2015-11-13 DIAGNOSIS — G479 Sleep disorder, unspecified: Secondary | ICD-10-CM | POA: Diagnosis not present

## 2015-11-13 DIAGNOSIS — Z951 Presence of aortocoronary bypass graft: Secondary | ICD-10-CM | POA: Diagnosis not present

## 2015-11-13 DIAGNOSIS — G8929 Other chronic pain: Secondary | ICD-10-CM | POA: Diagnosis not present

## 2015-11-13 DIAGNOSIS — M7552 Bursitis of left shoulder: Secondary | ICD-10-CM

## 2015-11-13 DIAGNOSIS — M170 Bilateral primary osteoarthritis of knee: Secondary | ICD-10-CM

## 2015-11-13 DIAGNOSIS — N189 Chronic kidney disease, unspecified: Secondary | ICD-10-CM | POA: Diagnosis not present

## 2015-11-13 DIAGNOSIS — C801 Malignant (primary) neoplasm, unspecified: Secondary | ICD-10-CM | POA: Diagnosis not present

## 2015-11-13 DIAGNOSIS — F329 Major depressive disorder, single episode, unspecified: Secondary | ICD-10-CM | POA: Diagnosis not present

## 2015-11-13 DIAGNOSIS — I129 Hypertensive chronic kidney disease with stage 1 through stage 4 chronic kidney disease, or unspecified chronic kidney disease: Secondary | ICD-10-CM | POA: Diagnosis not present

## 2015-11-13 DIAGNOSIS — M7582 Other shoulder lesions, left shoulder: Secondary | ICD-10-CM

## 2015-11-13 MED ORDER — GABAPENTIN 600 MG PO TABS: 600.0000 mg | ORAL_TABLET | Freq: Three times a day (TID) | ORAL | 1 refills | Status: DC

## 2015-11-13 MED ORDER — DULOXETINE HCL 60 MG PO CPEP: 60.0000 mg | ORAL_CAPSULE | Freq: Every day | ORAL | 1 refills | Status: DC

## 2015-11-13 NOTE — Progress Notes (Signed)
Subjective:    Patient ID: [name], male    DOB: Sep 22, 1940, 75 y.o.   MRN: [id number]  HPI  75 y/o male with pmh of HTN, HLD, GERD, RLS, CKD, CABG, Depression, generalized OA, bladder CA, low back pain presents for follow up with left knee pain. Located mainly on the medial aspect. Pain started ~2005 year ago and getting progressively worse.  He denies inciting event.  Rest improves the pain.  Walking exacerbated the pain.  Sharp pain.  Non-radiating.  Constant pain.  He has associated weakness.  He has had arthroscopy ~10 years.  He has had steroid injections, which only lasted a short time, last one ~2014.  Pain prevents from doing enjoyable activities like fishing, hunting, gardening.  He has tried Ambulance person which helps.  He also had an MRI of his shoulder, at [facility], which showed bursitis ~2014.   Last clinic visit 10/03/15.  (Was seen for shoulder injection 10/30/15).  Of note, pt know significant improvement in shoulder pain.  Since last visit, he did not go to PT because he states he was never called. The increased Gabapentin helped. He did obtain films of his knees.  He notes improvement with Cymbalta as well. He has not followed up with the pharmacy to obtain Voltaren gel.  Sleep has improved.  He is doing significantly better.  He notes intermittent cramps.    Pain Inventory Average Pain 8 Pain Right Now 7 My pain is aching  In the last 24 hours, has pain interfered with the following? General activity 8 Relation with others 6 Enjoyment of life 8 What TIME of day is your pain at its worst? daytime Sleep (in general) Poor  Pain is worse with: walking, bending, sitting, standing and some activites Pain improves with: medication Relief from Meds: 5  Mobility walk without assistance how many minutes can you walk? 60 ability to climb steps?  yes do you drive?  yes Do you have any goals in this area?  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  yes  Neuro/Psych bowel control problems tremor spasms anxiety  Prior Studies Any changes since last visit?  no x-rays  Physicians involved in your care Any changes since last visit?  no   Family History  Problem Relation Age of Onset  . Cancer Mother     melanoma  . Stroke Father   . Hypertension Father   . Coronary artery disease    . Colon cancer Neg Hx   . Esophageal cancer Neg Hx   . Stomach cancer Neg Hx   . Rectal cancer Neg Hx    Social History   Social History  . Marital status: Married    Spouse name: N/A  . Number of children: N/A  . Years of education: N/A   Social History Main Topics  . Smoking status: Former Smoker    Quit date: 03/30/1976  . Smokeless tobacco: Never Used     Comment: quit in 1978  . Alcohol use No  . Drug use: No  . Sexual activity: Yes   Other Topics Concern  . None   Social History Narrative  . None   Past Surgical History:  Procedure Laterality Date  . bladder cancer    . CERVICAL DISCECTOMY     ACDF  . COLONOSCOPY  11/17/2005   normal   . CORONARY ARTERY BYPASS GRAFT  x4  . ESOPHAGOGASTRODUODENOSCOPY  04/29/2010  . KNEE ARTHROSCOPY    . LUMBAR LAMINECTOMY     and fusion  . POPLITEAL SYNOVIAL CYST EXCISION     Past Medical History:  Diagnosis Date  . Allergy   . Anemia   . B12 deficiency anemia   . Blood transfusion without reported diagnosis   . CAD (coronary artery disease)   . Cancer ([place])    bladder cancer 8 times  . Colon polyps   . COPD (chronic obstructive pulmonary disease) ([place])   . Depression   . Esophagitis   . Esophagus, Barrett's   . GERD (gastroesophageal reflux disease)   . History of bladder cancer   . Hyperlipidemia   . Hypertension   . Hypothyroidism   . Localized osteoarthrosis, lower leg   . Low back pain   . Malaise and fatigue   . Stenosis of esophagus    BP (!) 149/87 (BP Location: Left Arm, Patient Position: Sitting, Cuff Size: Large)    Pulse 76   SpO2 94%   Opioid Risk Score:   Fall Risk Score:  `1  Depression screen PHQ 2/9  Depression screen [facility] 2/9 09/05/2015 06/07/2015 06/06/2014 05/29/2013 02/15/2012 07/17/2011  Decreased Interest 2 0 0 0 2 1  Down, Depressed, Hopeless 1 0 0 0 1 1  PHQ - 2 Score 3 0 0 0 3 2  Altered sleeping 1 - - - 0 -  Tired, decreased energy 2 - - - 1 -  Change in appetite 1 - - - 0 -  Feeling bad or failure about yourself  1 - - - 1 -  Trouble concentrating 2 - - - 1 -  Moving slowly or fidgety/restless 2 - - - 0 -  Suicidal thoughts 1 - - - 0 -  PHQ-9 Score 13 - - - 6 -  Difficult doing work/chores Very difficult - - - - -    Review of Systems  Constitutional: Positive for diaphoresis.  HENT: Negative.   Eyes: Negative.   Respiratory: Positive for shortness of breath.   Gastrointestinal: Positive for abdominal pain.  Endocrine: Negative.   Genitourinary: Positive for dysuria.  Musculoskeletal: Positive for arthralgias, back pain and gait problem.       Spasms   Skin: Negative.   Allergic/Immunologic: Negative.   Neurological:       Tingling Spasms   Psychiatric/Behavioral: The patient is nervous/anxious.   All other systems reviewed and are negative.     Objective:   Physical Exam Gen: NAD. Vital signs reviewed HENT: Normocephalic, Atraumatic Eyes: EOMI, Conj WNL Cardio: S1, S2 normal, RRR Pulm: B/l clear to auscultation.  Effort normal Abd: Soft, non-distended, non-tender, BS+ MSK:  Gait slightly antalgic, including heel/toe walking.   No TTP.    No edema.   Neg grind test  Neg McMurrey's Neuro: CN II-XII grossly intact.    Sensation intact to light touch in all LE dermatomes, except feet (chronic)  Reflexes 1+ throughout  Strength  5/5 in all LE myotomes Skin: Warm and Dry    Assessment & Plan:  75 y/o male with pmh of HTN, HLD, GERD, CKD, CABG, Depression, bladder CA, generalized OA, RLS, low back pain presents with left knee pain.   1. Chronic left knee  pain  Due to CAD, will avoid NSAIDs  Pt has tried bracing without benefit  Cont ice/heat   Signed pain contract and UDS  Xrays of b/l knees reviewed, severe OA, L>R.   Pt  states he did not receive call from, encouraged follow up PT for generalized pain and OA, with TENS, strengthening and quad strengthening and aquatic therapy  Will increase Gabapentin to 600 to TID  Instructed to obtain Voltaren gel (again)  Will increase Cymbalta to 60mg  daily  Will consider lidoderm patch/compound medication in future if necessary  Has tried muscle relaxer without benefit, will consider revisiting   2. Generalized OA  See #1  3. Sleep disturbance  Gabapentin increased to 600 TID  4. Abnormality of gait  Forward lean with near falls  PT ordered  5. Left shoulder  MRI reviewed 06/2014 showing degenerative changes, rotator cuff pathology, and bursitis  Steroid injection performed on 10/30/15 with great benefit

## 2015-11-14 ENCOUNTER — Ambulatory Visit: Payer: Medicare Other | Admitting: Physical Medicine & Rehabilitation

## 2015-11-23 ENCOUNTER — Other Ambulatory Visit: Payer: Self-pay | Admitting: Internal Medicine

## 2015-11-25 DIAGNOSIS — N359 Urethral stricture, unspecified: Secondary | ICD-10-CM | POA: Diagnosis not present

## 2015-11-25 DIAGNOSIS — Z8551 Personal history of malignant neoplasm of bladder: Secondary | ICD-10-CM | POA: Diagnosis not present

## 2015-11-27 ENCOUNTER — Ambulatory Visit (INDEPENDENT_AMBULATORY_CARE_PROVIDER_SITE_OTHER): Payer: Medicare Other | Admitting: *Deleted

## 2015-11-27 ENCOUNTER — Telehealth: Payer: Self-pay | Admitting: Internal Medicine

## 2015-11-27 DIAGNOSIS — E538 Deficiency of other specified B group vitamins: Secondary | ICD-10-CM

## 2015-11-27 MED ORDER — OXYCODONE-ACETAMINOPHEN 10-325 MG PO TABS: 1.0000 | ORAL_TABLET | Freq: Two times a day (BID) | ORAL | 0 refills | Status: DC | PRN

## 2015-11-27 MED ORDER — CYANOCOBALAMIN 1000 MCG/ML IJ SOLN
1000.0000 ug | Freq: Once | INTRAMUSCULAR | Status: AC
  Administered 2015-11-27: 1000 ug via INTRAMUSCULAR

## 2015-11-27 NOTE — Telephone Encounter (Signed)
Left message on voicemail Rx ready for pickup, will be at the front desk. Rx printed and signed. 

## 2015-11-27 NOTE — Telephone Encounter (Signed)
° ° °  Pt req refill on oxycodone

## 2015-12-03 ENCOUNTER — Encounter: Payer: Self-pay | Admitting: Internal Medicine

## 2015-12-03 ENCOUNTER — Ambulatory Visit (INDEPENDENT_AMBULATORY_CARE_PROVIDER_SITE_OTHER): Payer: Medicare Other | Admitting: Internal Medicine

## 2015-12-03 VITALS — BP 144/72 | HR 87 | Temp 98.2°F | Wt 193.6 lb

## 2015-12-03 DIAGNOSIS — K2270 Barrett's esophagus without dysplasia: Secondary | ICD-10-CM

## 2015-12-03 DIAGNOSIS — D518 Other vitamin B12 deficiency anemias: Secondary | ICD-10-CM

## 2015-12-03 DIAGNOSIS — E039 Hypothyroidism, unspecified: Secondary | ICD-10-CM | POA: Diagnosis not present

## 2015-12-03 DIAGNOSIS — G8929 Other chronic pain: Secondary | ICD-10-CM

## 2015-12-03 DIAGNOSIS — I10 Essential (primary) hypertension: Secondary | ICD-10-CM | POA: Diagnosis not present

## 2015-12-03 DIAGNOSIS — I255 Ischemic cardiomyopathy: Secondary | ICD-10-CM | POA: Diagnosis not present

## 2015-12-03 DIAGNOSIS — M545 Low back pain, unspecified: Secondary | ICD-10-CM

## 2015-12-03 NOTE — Patient Instructions (Signed)
Limit your sodium (Salt) intake    It is important that you exercise regularly, at least 20 minutes 3 to 4 times per week.  If you develop chest pain or shortness of breath seek  medical attention.  Schedule your colonoscopy to help detect colon cancer.  Return in 6 months for follow-up

## 2015-12-03 NOTE — Progress Notes (Signed)
Subjective:    Patient ID: [name], male    DOB: [birth year], 75 y.o.   MRN: [id number]  HPI  75 year old patient who is seen today for his six-month follow-up.  He has a history of coronary artery disease status post CABG.  He has ischemic cardiomyopathy. He is followed closely by urology due to bladder neoplasm.  He has had a recent evaluation. He has osteoarthritis and chronic back pain.  He is also followed closely by pain management. He has had a recent recall letter for follow-up colonoscopy. He has dyslipidemia and remains on atorvastatin. No cardiopulmonary complaints. All complaints are musculoskeletal.  Past Medical History:  Diagnosis Date  . Allergy   . Anemia   . B12 deficiency anemia   . Blood transfusion without reported diagnosis   . CAD (coronary artery disease)   . Cancer ([place])    bladder cancer 8 times  . Colon polyps   . COPD (chronic obstructive pulmonary disease) ([place])   . Depression   . Esophagitis   . Esophagus, Barrett's   . GERD (gastroesophageal reflux disease)   . History of bladder cancer   . Hyperlipidemia   . Hypertension   . Hypothyroidism   . Localized osteoarthrosis, lower leg   . Low back pain   . Malaise and fatigue   . Stenosis of esophagus      Social History   Social History  . Marital status: Married    Spouse name: N/A  . Number of children: N/A  . Years of education: N/A   Occupational History  . Not on file.   Social History Main Topics  . Smoking status: Former Smoker    Quit date: 03/30/1976  . Smokeless tobacco: Never Used     Comment: quit in 1978  . Alcohol use No  . Drug use: No  . Sexual activity: Yes   Other Topics Concern  . Not on file   Social History Narrative  . No narrative on file    Past Surgical History:  Procedure Laterality Date  . bladder cancer    . CERVICAL DISCECTOMY     ACDF  . COLONOSCOPY  11/17/2005   normal   . CORONARY ARTERY BYPASS GRAFT     x4  .  ESOPHAGOGASTRODUODENOSCOPY  04/29/2010  . KNEE ARTHROSCOPY    . LUMBAR LAMINECTOMY     and fusion  . POPLITEAL SYNOVIAL CYST EXCISION      Family History  Problem Relation Age of Onset  . Cancer Mother     melanoma  . Stroke Father   . Hypertension Father   . Coronary artery disease    . Colon cancer Neg Hx   . Esophageal cancer Neg Hx   . Stomach cancer Neg Hx   . Rectal cancer Neg Hx     Allergies  Allergen Reactions  . Adhesive [Tape]     Itching, reddened skin    Current Outpatient Prescriptions on File Prior to Visit  Medication Sig Dispense Refill  . aspirin 81 MG tablet Take 81 mg by mouth daily.      [name] atorvastatin (LIPITOR) 40 MG tablet Take 1 tablet (40 mg total) by mouth daily. 90 tablet 0  . cephALEXin (KEFLEX) 500 MG capsule     . diclofenac sodium (VOLTAREN) 1 % GEL Apply 2 g topically 4 (four) times daily. 300 g 0  . DULoxetine (CYMBALTA) 60 MG capsule Take 1 capsule (60 mg total) by mouth daily. 30 capsule  1  . etodolac (LODINE) 300 MG capsule TAKE ONE CAPSULE BY MOUTH EVERY 8 HOURS 99991111 capsule 0  . folic acid (FOLVITE) A999333 MCG tablet Take 400 mcg by mouth daily.      [name] gabapentin (NEURONTIN) 600 MG tablet Take 1 tablet (600 mg total) by mouth 3 (three) times daily. 90 tablet 1  . isosorbide mononitrate (IMDUR) 60 MG 24 hr tablet TAKE ONE TABLET BY MOUTH DAILY. 180 tablet 1  . losartan (COZAAR) 50 MG tablet TAKE ONE TABLET BY MOUTH ONCE DAILY 90 tablet 1  . nebivolol (BYSTOLIC) 10 MG tablet Take 0.5 tablets (5 mg total) by mouth daily. 45 tablet 1  . nitroGLYCERIN (NITROSTAT) 0.4 MG SL tablet Place 1 tablet (0.4 mg total) under the tongue every 5 (five) minutes as needed for chest pain. 30 tablet 3  . omeprazole (PRILOSEC) 10 MG capsule Take 10 mg by mouth daily.    [name] oxyCODONE-acetaminophen (PERCOCET) 10-325 MG tablet Take 1 tablet by mouth 2 (two) times daily as needed. 60 tablet 0  . rOPINIRole (REQUIP) 2 MG tablet TAKE ONE TABLET BY MOUTH AT BEDTIME. 90  tablet 1  . venlafaxine (EFFEXOR) 75 MG tablet TAKE ONE TABLET BY MOUTH ONCE DAILY. 90 tablet 3   No current facility-administered medications on file prior to visit.     BP (!) 144/72 (BP Location: Right Arm, Patient Position: Sitting, Cuff Size: Normal)   Pulse 87   Temp 98.2 F (36.8 C) (Oral)   Wt 193 lb 9.6 oz (87.8 kg)   SpO2 98%   BMI 29.44 kg/m      Review of Systems  Constitutional: Negative for appetite change, chills, fatigue and fever.  HENT: Negative for congestion, dental problem, ear pain, hearing loss, sore throat, tinnitus, trouble swallowing and voice change.   Eyes: Negative for pain, discharge and visual disturbance.  Respiratory: Negative for cough, chest tightness, wheezing and stridor.   Cardiovascular: Negative for chest pain, palpitations and leg swelling.  Gastrointestinal: Positive for abdominal pain. Negative for abdominal distention, blood in stool, constipation, diarrhea, nausea and vomiting.  Genitourinary: Negative for difficulty urinating, discharge, flank pain, genital sores, hematuria and urgency.  Musculoskeletal: Positive for arthralgias, back pain and gait problem. Negative for joint swelling, myalgias and neck stiffness.  Skin: Negative for rash.  Neurological: Negative for dizziness, syncope, speech difficulty, weakness, numbness and headaches.  Hematological: Negative for adenopathy. Does not bruise/bleed easily.  Psychiatric/Behavioral: Negative for behavioral problems and dysphoric mood. The patient is not nervous/anxious.        Objective:   Physical Exam  Constitutional: He is oriented to person, place, and time. He appears well-developed.  Repeat blood pressure 100/62  HENT:  Head: Normocephalic.  Right Ear: External ear normal.  Left Ear: External ear normal.  Eyes: Conjunctivae and EOM are normal.  Neck: Normal range of motion.  Cardiovascular: Normal rate and normal heart sounds.   Pulmonary/Chest: Breath sounds normal.    Status post sternotomy  Abdominal: Bowel sounds are normal.  Musculoskeletal: Normal range of motion. He exhibits no edema or tenderness.  Neurological: He is alert and oriented to person, place, and time.  Psychiatric: He has a normal mood and affect. His behavior is normal.          Assessment & Plan:   Coronary artery disease/ischemic cardiomyopathy, stable Essential hypertension, well-controlled Dyslipidemia.  Continue statin therapy Chronic low back pain.  Followed chronic pain management  CPX here 6 months  [name] Pilar Plate

## 2015-12-03 NOTE — Progress Notes (Signed)
Pre visit review using our clinic review tool, if applicable. No additional management support is needed unless otherwise documented below in the visit note. 

## 2015-12-11 ENCOUNTER — Encounter: Payer: Medicare Other | Admitting: Physical Medicine & Rehabilitation

## 2015-12-11 ENCOUNTER — Encounter: Payer: Self-pay | Admitting: Registered Nurse

## 2015-12-11 ENCOUNTER — Encounter: Payer: Medicare Other | Attending: Physical Medicine & Rehabilitation | Admitting: Registered Nurse

## 2015-12-11 VITALS — BP 157/80 | HR 97

## 2015-12-11 DIAGNOSIS — M170 Bilateral primary osteoarthritis of knee: Secondary | ICD-10-CM | POA: Diagnosis not present

## 2015-12-11 DIAGNOSIS — G479 Sleep disorder, unspecified: Secondary | ICD-10-CM | POA: Diagnosis not present

## 2015-12-11 DIAGNOSIS — G8929 Other chronic pain: Secondary | ICD-10-CM | POA: Insufficient documentation

## 2015-12-11 DIAGNOSIS — I129 Hypertensive chronic kidney disease with stage 1 through stage 4 chronic kidney disease, or unspecified chronic kidney disease: Secondary | ICD-10-CM | POA: Diagnosis not present

## 2015-12-11 DIAGNOSIS — M25562 Pain in left knee: Secondary | ICD-10-CM | POA: Insufficient documentation

## 2015-12-11 DIAGNOSIS — E785 Hyperlipidemia, unspecified: Secondary | ICD-10-CM | POA: Insufficient documentation

## 2015-12-11 DIAGNOSIS — M159 Polyosteoarthritis, unspecified: Secondary | ICD-10-CM

## 2015-12-11 DIAGNOSIS — G894 Chronic pain syndrome: Secondary | ICD-10-CM

## 2015-12-11 DIAGNOSIS — K219 Gastro-esophageal reflux disease without esophagitis: Secondary | ICD-10-CM | POA: Diagnosis not present

## 2015-12-11 DIAGNOSIS — N189 Chronic kidney disease, unspecified: Secondary | ICD-10-CM | POA: Insufficient documentation

## 2015-12-11 DIAGNOSIS — F329 Major depressive disorder, single episode, unspecified: Secondary | ICD-10-CM | POA: Diagnosis not present

## 2015-12-11 DIAGNOSIS — C801 Malignant (primary) neoplasm, unspecified: Secondary | ICD-10-CM | POA: Insufficient documentation

## 2015-12-11 DIAGNOSIS — Z951 Presence of aortocoronary bypass graft: Secondary | ICD-10-CM | POA: Insufficient documentation

## 2015-12-11 DIAGNOSIS — M545 Low back pain, unspecified: Secondary | ICD-10-CM

## 2015-12-11 MED ORDER — DICLOFENAC SODIUM 1 % TD GEL: 2.0000 g | Freq: Four times a day (QID) | TRANSDERMAL | 0 refills | Status: DC

## 2015-12-11 NOTE — Progress Notes (Signed)
Subjective:    Patient ID: [name], male    DOB: 1940/06/20, 75 y.o.   MRN: [id number]  HPI: [name] is a 75 year old male who returns for follow up appointment. He states his pain is located in his bilateral hands, bilateral upper extremities,lower back and left knee. He rates his pain 9. His current exercise regime is walking. According to [name] is prescribed by Dr. Burnice Hughes and picked up on  11/27/2015.  Pain Inventory Average Pain 8 Pain Right Now 9 My pain is stabbing and aching  In the last 24 hours, has pain interfered with the following? General activity 8 Relation with others 7 Enjoyment of life 8 What TIME of day is your pain at its worst? varies Sleep (in general) Poor  Pain is worse with: sitting and standing Pain improves with: medication Relief from Meds: 2  Mobility walk without assistance ability to climb steps?  yes do you drive?  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  yes  Neuro/Psych bladder control problems weakness spasms anxiety  Prior Studies Any changes since last visit?  no  Physicians involved in your care Any changes since last visit?  no   Family History  Problem Relation Age of Onset  . Cancer Mother     melanoma  . Stroke Father   . Hypertension Father   . Coronary artery disease    . Colon cancer Neg Hx   . Esophageal cancer Neg Hx   . Stomach cancer Neg Hx   . Rectal cancer Neg Hx    Social History   Social History  . Marital status: Married    Spouse name: N/A  . Number of children: N/A  . Years of education: N/A   Social History Main Topics  . Smoking status: Former Smoker    Quit date: 03/30/1976  . Smokeless tobacco: Never Used     Comment: quit in 1978  . Alcohol use No  . Drug use: No  . Sexual activity: Yes   Other Topics Concern  . None   Social History Narrative  . None   Past Surgical  History:  Procedure Laterality Date  . bladder cancer    . CERVICAL DISCECTOMY     ACDF  . COLONOSCOPY  11/17/2005   normal   . CORONARY ARTERY BYPASS GRAFT     x4  . ESOPHAGOGASTRODUODENOSCOPY  04/29/2010  . KNEE ARTHROSCOPY    . LUMBAR LAMINECTOMY     and fusion  . POPLITEAL SYNOVIAL CYST EXCISION     Past Medical History:  Diagnosis Date  . Allergy   . Anemia   . B12 deficiency anemia   . Blood transfusion without reported diagnosis   . CAD (coronary artery disease)   . Cancer ([place])    bladder cancer 8 times  . Colon polyps   . COPD (chronic obstructive pulmonary disease) ([place])   . Depression   . Esophagitis   . Esophagus, Barrett's   . GERD (gastroesophageal reflux disease)   . History of bladder cancer   . Hyperlipidemia   . Hypertension   . Hypothyroidism   . Localized osteoarthrosis, lower leg   . Low back pain   . Malaise and fatigue   . Stenosis of esophagus    BP (!) 157/80 (BP Location: Left Arm, Patient Position: Sitting, Cuff Size: Normal)   Pulse 97  SpO2 95%   Opioid Risk Score:   Fall Risk Score:  `1  Depression screen PHQ 2/9  Depression screen [facility] 2/9 09/05/2015 06/07/2015 06/06/2014 05/29/2013 02/15/2012 07/17/2011  Decreased Interest 2 0 0 0 2 1  Down, Depressed, Hopeless 1 0 0 0 1 1  PHQ - 2 Score 3 0 0 0 3 2  Altered sleeping 1 - - - 0 -  Tired, decreased energy 2 - - - 1 -  Change in appetite 1 - - - 0 -  Feeling bad or failure about yourself  1 - - - 1 -  Trouble concentrating 2 - - - 1 -  Moving slowly or fidgety/restless 2 - - - 0 -  Suicidal thoughts 1 - - - 0 -  PHQ-9 Score 13 - - - 6 -  Difficult doing work/chores Very difficult - - - - -   Review of Systems  Constitutional: Positive for chills and fever.       Night sweats  HENT: Negative.   Eyes: Negative.   Respiratory: Positive for apnea and shortness of breath.   Cardiovascular: Negative.   Gastrointestinal: Positive for abdominal pain.  Endocrine: Negative.     Genitourinary: Positive for difficulty urinating.  Musculoskeletal: Positive for arthralgias, back pain and joint swelling.       Spasms  Skin: Negative.   Allergic/Immunologic: Negative.   Neurological: Negative.   Hematological: Negative.   Psychiatric/Behavioral: The patient is nervous/anxious.   All other systems reviewed and are negative.      Objective:   Physical Exam  Constitutional: He is oriented to person, place, and time. He appears well-developed and well-nourished.  HENT:  Head: Normocephalic and atraumatic.  Neck: Normal range of motion. Neck supple.  Cardiovascular: Normal rate and regular rhythm.   Pulmonary/Chest: Effort normal and breath sounds normal.  Musculoskeletal:  Normal Muscle Bulk and Muscle Testing Reveals: Upper Extremities: Full ROM and Muscle Strength 5/5 Back without spinal tenderness noted Lower Extremities: Full ROM and Muscle Strength 5/5 Right Lower Extremity Flexion Produces Pain into Right Hip Arises from Table with ease Narrow Based Gait  Neurological: He is alert and oriented to person, place, and time.  Skin: Skin is warm and dry.  Psychiatric: He has a normal mood and affect.  Nursing note and vitals reviewed.         Assessment & Plan:  1. Chronic left knee pain: Schedule for Left Knee injection with Dr. Posey Hughes. 2. LBP: Continue HEP as tolerated 3. Generalized OA: Continue with Voltaren Gel 4. Chronic Pain Syndrome: Continue Gabapentin and Cymbalta  20 minutes of face to face patient care time was spent during this visit. All questions were encouraged and answered.

## 2015-12-26 ENCOUNTER — Ambulatory Visit (INDEPENDENT_AMBULATORY_CARE_PROVIDER_SITE_OTHER): Payer: Medicare Other | Admitting: Cardiovascular Disease

## 2015-12-26 ENCOUNTER — Encounter: Payer: Self-pay | Admitting: Cardiovascular Disease

## 2015-12-26 VITALS — BP 153/91 | HR 74 | Ht 69.0 in | Wt 191.4 lb

## 2015-12-26 DIAGNOSIS — E785 Hyperlipidemia, unspecified: Secondary | ICD-10-CM

## 2015-12-26 DIAGNOSIS — D518 Other vitamin B12 deficiency anemias: Secondary | ICD-10-CM

## 2015-12-26 DIAGNOSIS — I10 Essential (primary) hypertension: Secondary | ICD-10-CM

## 2015-12-26 DIAGNOSIS — I2510 Atherosclerotic heart disease of native coronary artery without angina pectoris: Secondary | ICD-10-CM | POA: Diagnosis not present

## 2015-12-26 DIAGNOSIS — I2583 Coronary atherosclerosis due to lipid rich plaque: Secondary | ICD-10-CM | POA: Diagnosis not present

## 2015-12-26 DIAGNOSIS — G2581 Restless legs syndrome: Secondary | ICD-10-CM

## 2015-12-26 DIAGNOSIS — E039 Hypothyroidism, unspecified: Secondary | ICD-10-CM | POA: Diagnosis not present

## 2015-12-26 LAB — COMPREHENSIVE METABOLIC PANEL
ALBUMIN: 4.1 g/dL (ref 3.6–5.1)
ALK PHOS: 82 U/L (ref 40–115)
ALT: 12 U/L (ref 9–46)
AST: 21 U/L (ref 10–35)
BUN: 15 mg/dL (ref 7–25)
CALCIUM: 9.4 mg/dL (ref 8.6–10.3)
CO2: 31 mmol/L (ref 20–31)
CREATININE: 1.17 mg/dL (ref 0.70–1.18)
Chloride: 102 mmol/L (ref 98–110)
Glucose, Bld: 118 mg/dL — ABNORMAL HIGH (ref 65–99)
Potassium: 4.7 mmol/L (ref 3.5–5.3)
Sodium: 141 mmol/L (ref 135–146)
TOTAL PROTEIN: 7.2 g/dL (ref 6.1–8.1)
Total Bilirubin: 0.9 mg/dL (ref 0.2–1.2)

## 2015-12-26 LAB — TSH: TSH: 2.36 mIU/L (ref 0.40–4.50)

## 2015-12-26 LAB — CBC
HEMATOCRIT: 42.5 % (ref 38.5–50.0)
HEMOGLOBIN: 14.2 g/dL (ref 13.2–17.1)
MCH: 29.4 pg (ref 27.0–33.0)
MCHC: 33.4 g/dL (ref 32.0–36.0)
MCV: 88 fL (ref 80.0–100.0)
MPV: 9.1 fL (ref 7.5–12.5)
Platelets: 156 10*3/uL (ref 140–400)
RBC: 4.83 MIL/uL (ref 4.20–5.80)
RDW: 14.3 % (ref 11.0–15.0)
WBC: 6.7 10*3/uL (ref 3.8–10.8)

## 2015-12-26 LAB — LIPID PANEL
CHOLESTEROL: 175 mg/dL (ref <200)
HDL: 32 mg/dL — AB (ref >40)
LDL Cholesterol: 115 mg/dL — ABNORMAL HIGH (ref <100)
TRIGLYCERIDES: 138 mg/dL (ref <150)
Total CHOL/HDL Ratio: 5.5 Ratio — ABNORMAL HIGH (ref <5.0)
VLDL: 28 mg/dL (ref <30)

## 2015-12-26 MED ORDER — LOSARTAN POTASSIUM 50 MG PO TABS: ORAL_TABLET | ORAL | 11 refills | Status: DC

## 2015-12-26 MED ORDER — NEBIVOLOL HCL 10 MG PO TABS: 10.0000 mg | ORAL_TABLET | Freq: Every day | ORAL | 11 refills | Status: DC

## 2015-12-26 NOTE — Patient Instructions (Signed)
Medication Instructions:  Increase the Bystolic to 1 tablet daily. ( 10 mg)  The losartan has been changed to 1.5 tablets daily (75 mg)   Labwork:  Fasting TODAY  Testing/Procedures:  none  Follow-Up:  none  Your physician wants you to follow-up in: 6 months or sooner if needed. You will receive a reminder letter in the mail two months in advance. If you don't receive a letter, please call our office to schedule the follow-up appointment.

## 2015-12-27 ENCOUNTER — Encounter: Payer: Medicare Other | Attending: Physical Medicine & Rehabilitation | Admitting: Physical Medicine & Rehabilitation

## 2015-12-27 ENCOUNTER — Encounter: Payer: Self-pay | Admitting: Physical Medicine & Rehabilitation

## 2015-12-27 VITALS — BP 170/91 | HR 80 | Resp 14

## 2015-12-27 DIAGNOSIS — M545 Low back pain: Secondary | ICD-10-CM | POA: Insufficient documentation

## 2015-12-27 DIAGNOSIS — G479 Sleep disorder, unspecified: Secondary | ICD-10-CM | POA: Insufficient documentation

## 2015-12-27 DIAGNOSIS — M25562 Pain in left knee: Secondary | ICD-10-CM | POA: Insufficient documentation

## 2015-12-27 DIAGNOSIS — I129 Hypertensive chronic kidney disease with stage 1 through stage 4 chronic kidney disease, or unspecified chronic kidney disease: Secondary | ICD-10-CM | POA: Diagnosis not present

## 2015-12-27 DIAGNOSIS — C801 Malignant (primary) neoplasm, unspecified: Secondary | ICD-10-CM | POA: Diagnosis not present

## 2015-12-27 DIAGNOSIS — Z951 Presence of aortocoronary bypass graft: Secondary | ICD-10-CM | POA: Insufficient documentation

## 2015-12-27 DIAGNOSIS — K219 Gastro-esophageal reflux disease without esophagitis: Secondary | ICD-10-CM | POA: Insufficient documentation

## 2015-12-27 DIAGNOSIS — F329 Major depressive disorder, single episode, unspecified: Secondary | ICD-10-CM | POA: Diagnosis not present

## 2015-12-27 DIAGNOSIS — G8929 Other chronic pain: Secondary | ICD-10-CM | POA: Diagnosis not present

## 2015-12-27 DIAGNOSIS — M159 Polyosteoarthritis, unspecified: Secondary | ICD-10-CM | POA: Diagnosis not present

## 2015-12-27 DIAGNOSIS — M1712 Unilateral primary osteoarthritis, left knee: Secondary | ICD-10-CM

## 2015-12-27 DIAGNOSIS — E785 Hyperlipidemia, unspecified: Secondary | ICD-10-CM | POA: Diagnosis not present

## 2015-12-27 DIAGNOSIS — N189 Chronic kidney disease, unspecified: Secondary | ICD-10-CM | POA: Diagnosis not present

## 2015-12-27 NOTE — Progress Notes (Deleted)
Subjective:    Patient ID: [name], male    DOB: December 24, 1940, 75 y.o.   MRN: [id number]  HPI  75 y/o male with pmh of HTN, HLD, GERD, RLS, CKD, CABG, Depression, generalized OA, bladder CA, low back pain presents for follow up with left knee pain. Located mainly on the medial aspect. Pain started ~2005 year ago and getting progressively worse.  He denies inciting event.  Rest improves the pain.  Walking exacerbated the pain.  Sharp pain.  Non-radiating.  Constant pain.  He has associated weakness.  He has had arthroscopy ~10 years.  He has had steroid injections, which only lasted a short time, last one ~2014.  Pain prevents from doing enjoyable activities like fishing, hunting, gardening.  He has tried Ambulance person which helps.  He also had an MRI of his shoulder, at [facility], which showed bursitis ~2014.   Last clinic visit 10/03/15.  (Was seen for shoulder injection 10/30/15).  Of note, pt know significant improvement in shoulder pain.  Since last visit, he did not go to PT because he states he was never called. The increased Gabapentin helped. He did obtain films of his knees.  He notes improvement with Cymbalta as well. He has not followed up with the pharmacy to obtain Voltaren gel.  Sleep has improved.  He is doing significantly better.  He notes intermittent cramps.    Pain Inventory Average Pain 8 Pain Right Now 7 My pain is aching  In the last 24 hours, has pain interfered with the following? General activity 8 Relation with others 6 Enjoyment of life 8 What TIME of day is your pain at its worst? daytime Sleep (in general) Poor  Pain is worse with: walking, bending, sitting, standing and some activites Pain improves with: medication Relief from Meds: 5  Mobility walk without assistance how many minutes can you walk? 60 ability to climb steps?  yes do you drive?  yes Do you have any goals in this area?  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  yes  Neuro/Psych bowel control problems tremor spasms anxiety  Prior Studies Any changes since last visit?  no x-rays  Physicians involved in your care Any changes since last visit?  no   Family History  Problem Relation Age of Onset  . Cancer Mother     melanoma  . Stroke Father   . Hypertension Father   . Coronary artery disease    . Colon cancer Neg Hx   . Esophageal cancer Neg Hx   . Stomach cancer Neg Hx   . Rectal cancer Neg Hx    Social History   Social History  . Marital status: Married    Spouse name: N/A  . Number of children: N/A  . Years of education: N/A   Social History Main Topics  . Smoking status: Former Smoker    Quit date: 03/30/1976  . Smokeless tobacco: Never Used     Comment: quit in 1978  . Alcohol use No  . Drug use: No  . Sexual activity: Yes   Other Topics Concern  . Not on file   Social History Narrative  . No narrative on file   Past Surgical History:  Procedure Laterality Date  . bladder cancer    . CERVICAL DISCECTOMY     ACDF  . COLONOSCOPY  11/17/2005   normal   . CORONARY ARTERY BYPASS  GRAFT     x4  . ESOPHAGOGASTRODUODENOSCOPY  04/29/2010  . KNEE ARTHROSCOPY    . LUMBAR LAMINECTOMY     and fusion  . POPLITEAL SYNOVIAL CYST EXCISION     Past Medical History:  Diagnosis Date  . Allergy   . Anemia   . B12 deficiency anemia   . Blood transfusion without reported diagnosis   . CAD (coronary artery disease)   . Cancer ([place])    bladder cancer 8 times  . Colon polyps   . COPD (chronic obstructive pulmonary disease) ([place])   . Depression   . Esophagitis   . Esophagus, Barrett's   . GERD (gastroesophageal reflux disease)   . History of bladder cancer   . Hyperlipidemia   . Hypertension   . Hypothyroidism   . Localized osteoarthrosis, lower leg   . Low back pain   . Malaise and fatigue   . Stenosis of esophagus    There were no vitals taken for this visit.  Opioid Risk  Score:   Fall Risk Score:  `1  Depression screen PHQ 2/9  Depression screen [facility] 2/9 09/05/2015 06/07/2015 06/06/2014 05/29/2013 02/15/2012 07/17/2011  Decreased Interest 2 0 0 0 2 1  Down, Depressed, Hopeless 1 0 0 0 1 1  PHQ - 2 Score 3 0 0 0 3 2  Altered sleeping 1 - - - 0 -  Tired, decreased energy 2 - - - 1 -  Change in appetite 1 - - - 0 -  Feeling bad or failure about yourself  1 - - - 1 -  Trouble concentrating 2 - - - 1 -  Moving slowly or fidgety/restless 2 - - - 0 -  Suicidal thoughts 1 - - - 0 -  PHQ-9 Score 13 - - - 6 -  Difficult doing work/chores Very difficult - - - - -    Review of Systems  Constitutional: Positive for diaphoresis.  HENT: Negative.   Eyes: Negative.   Respiratory: Positive for shortness of breath.   Gastrointestinal: Positive for abdominal pain.  Endocrine: Negative.   Genitourinary: Positive for dysuria.  Musculoskeletal: Positive for arthralgias, back pain and gait problem.       Spasms   Skin: Negative.   Allergic/Immunologic: Negative.   Neurological:       Tingling Spasms   Psychiatric/Behavioral: The patient is nervous/anxious.   All other systems reviewed and are negative.     Objective:   Physical Exam Gen: NAD. Vital signs reviewed HENT: Normocephalic, Atraumatic Eyes: EOMI, Conj WNL Cardio: S1, S2 normal, RRR Pulm: B/l clear to auscultation.  Effort normal Abd: Soft, non-distended, non-tender, BS+ MSK:  Gait slightly antalgic, including heel/toe walking.   No TTP.    No edema.   Neg grind test  Neg McMurrey's Neuro: CN II-XII grossly intact.    Sensation intact to light touch in all LE dermatomes, except feet (chronic)  Reflexes 1+ throughout  Strength  5/5 in all LE myotomes Skin: Warm and Dry    Assessment & Plan:  75 y/o male with pmh of HTN, HLD, GERD, CKD, CABG, Depression, bladder CA, generalized OA, RLS, low back pain presents with left knee pain.   1. Chronic left knee pain  Due to CAD, will avoid NSAIDs  Pt  has tried bracing without benefit  Cont ice/heat   Signed pain contract and UDS  Xrays of b/l knees reviewed, severe OA, L>R.   Pt states he did not receive call from, encouraged follow  up PT for generalized pain and OA, with TENS, strengthening and quad strengthening and aquatic therapy  Will increase Gabapentin to 600 to TID  Instructed to obtain Voltaren gel (again)  Will increase Cymbalta to 60mg  daily  Will consider lidoderm patch/compound medication in future if necessary  Has tried muscle relaxer without benefit, will consider revisiting   2. Generalized OA  See #1  3. Sleep disturbance  Gabapentin increased to 600 TID  4. Abnormality of gait  Forward lean with near falls  PT ordered  5. Left shoulder  MRI reviewed 06/2014 showing degenerative changes, rotator cuff pathology, and bursitis  Steroid injection performed on 10/30/15 with great benefit

## 2015-12-27 NOTE — Progress Notes (Signed)
Left knee injection Assistant: Zorita Pang  Indication: Knee pain not relieved by medication management, steroid injection, and other conservative care.  Informed consent was obtained after describing risks and benefits of the procedure with the patient, this includes bleeding, bruising, infection and medication side effects. The patient wishes to proceed and has given written consent. Patient was placed in a seated position. The left knee was marked and prepped with betadine in the anterolateral space. A 21-gauge 2 inch needle was inserted into the space. After negative draw back for blood, a solution containing 6 mL of SynviscOne was injected. A band aid was applied. The patient tolerated the procedure well. Post procedure instructions were given.

## 2015-12-28 NOTE — Progress Notes (Signed)
Patient ID: [name], male   DOB: 1940/11/18, 75 y.o.   MRN: [id number]      HPI: [name] is a 75 y.o. male who presents to the office today for a 6 month cardiology followup evaluation.  [name] has known CAD and underwent CABG revascularization surgery in 2001 with a LIMA to his LAD, vein to the marginal, vein to the RCA. Catheterization in 2004 showed patent grafts. An echo Doppler study in August 2012 revealed an ejection fraction of 40-45% with moderate septal hypokinesis with grade 1 diastolic dysfunction as well as aortic valve sclerosis. Additional problems include GERD, mild emphysema, as well as obstructive sleep apnea. He no longer utilizes CPAP. He has lost approximately 25 pounds since his wife's death from a motor vehicle accident in January 2013.  He remains active and notes mild shortness of breath with activity particularly when walking up hills but this has not changed.  He also is a history of restless leg syndrome and has benefited with requip. Laboratory in 2013 demonstrated LDL 57 with LDL particle #[id number], small LDL particle number was 611, slightly increased, HDLC was 35 triglycerides 93 total cholesterol 111 initial resistance course slightly elevated at 53.  BP blood work in March 2015 revealed normal renal function.  Fasting glucose was minimally increased at 106.  Total cholesterol was 116 triglycerides 80, and LDL 66, with an HDL of 34.  LDL particle number was slightly increased at 1190.  Since I last saw him, he undewent a five-year follow-up nuclear perfusion study in November 2016.  Ejection fraction was 54%.  He had normal perfusion.  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  He admits to arthralgias without myalgias.  He has issues with his in his legs and his peripheral neuropathy.  He denies episodes of chest pain.  He denies PND, orthopnea.  He presents for  evaluation.  Past Medical History:  Diagnosis Date  . Allergy   . Anemia   . B12 deficiency anemia   . Blood transfusion without reported diagnosis   . CAD (coronary artery disease)   . Cancer (HCC)    bladder cancer 8 times  . Colon polyps   . COPD (chronic obstructive pulmonary disease) (HCC)   . Depression   . Esophagitis   . Esophagus, Barrett's   . GERD (gastroesophageal reflux disease)   . History of bladder cancer   . Hyperlipidemia   . Hypertension   . Hypothyroidism   . Localized osteoarthrosis, lower leg   . Low back pain   . Malaise and fatigue   . Stenosis of esophagus     Past Surgical History:  Procedure Laterality Date  . bladder cancer    . CERVICAL DISCECTOMY     ACDF  . COLONOSCOPY  11/17/2005   normal   . CORONARY ARTERY BYPASS GRAFT     x4  . ESOPHAGOGASTRODUODENOSCOPY  04/29/2010  . KNEE ARTHROSCOPY    . LUMBAR LAMINECTOMY     and fusion  . POPLITEAL SYNOVIAL CYST EXCISION      Allergies  Allergen Reactions  . Adhesive [Tape]     Itching, reddened skin    Current Outpatient Prescriptions  Medication Sig Dispense Refill  . aspirin 81 MG tablet Take 81 mg by mouth daily.      [name] atorvastatin (LIPITOR) 40 MG tablet Take 1 tablet (40 mg total) by mouth  daily. 90 tablet 0  . cephALEXin (KEFLEX) 500 MG capsule     . diclofenac sodium (VOLTAREN) 1 % GEL Apply 2 g topically 4 (four) times daily. 300 g 0  . DULoxetine (CYMBALTA) 60 MG capsule Take 1 capsule (60 mg total) by mouth daily. 30 capsule 1  . etodolac (LODINE) 300 MG capsule TAKE ONE CAPSULE BY MOUTH EVERY 8 HOURS 494 capsule 0  . folic acid (FOLVITE) 496 MCG tablet Take 400 mcg by mouth daily.      [name] gabapentin (NEURONTIN) 600 MG tablet Take 1 tablet (600 mg total) by mouth 3 (three) times daily. 90 tablet 1  . isosorbide mononitrate (IMDUR) 60 MG 24 hr tablet TAKE ONE TABLET BY MOUTH DAILY. 180 tablet 1  . losartan (COZAAR) 50 MG tablet Take 1.5 tablets daily 45 tablet 11  . nebivolol  (BYSTOLIC) 10 MG tablet Take 1 tablet (10 mg total) by mouth daily. 45 tablet 11  . nitroGLYCERIN (NITROSTAT) 0.4 MG SL tablet Place 1 tablet (0.4 mg total) under the tongue every 5 (five) minutes as needed for chest pain. 30 tablet 3  . omeprazole (PRILOSEC) 10 MG capsule Take 10 mg by mouth daily.    [name] oxyCODONE-acetaminophen (PERCOCET) 10-325 MG tablet Take 1 tablet by mouth 2 (two) times daily as needed. 60 tablet 0  . rOPINIRole (REQUIP) 2 MG tablet TAKE ONE TABLET BY MOUTH AT BEDTIME. 90 tablet 1  . venlafaxine (EFFEXOR) 75 MG tablet TAKE ONE TABLET BY MOUTH ONCE DAILY. 90 tablet 3   No current facility-administered medications for this visit.    Socially he is widowed in January 2013. He has 3 children 6 grandchildren. 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 There is no recent tobacco use.  no alcohol  ROS General: Negative; No fevers, chills, or night sweats;  HEENT: Negative; No changes in vision or hearing, sinus congestion, difficulty swallowing Pulmonary: Negative; No cough, wheezing, shortness of breath, hemoptysis Cardiovascular: Negative; No chest pain, presyncope, syncope, palpatations.  Minimal shortness of breath with activity GI: GERD; No nausea, vomiting, diarrhea, or abdominal pain GU: Negative; No dysuria, hematuria, or difficulty voiding Musculoskeletal: Positive for multiple joint aches involving his legs, hips, back, and shoulder Hematologic/Oncology: Negative; no easy bruising, bleeding Endocrine: Negative; no heat/cold intolerance; no diabetes Neuro: Negative; no changes in balance, headaches Skin: Negative; No rashes or skin lesions Psychiatric: Negative; No behavioral problems, depression Sleep: He no longer uses CPAP with his 25 pound weight loss.  His restless legs improved with low-dose Requip; No snoring, daytime sleepiness, hypersomnolence, bruxism, hypnogognic hallucinations,  no cataplexy Other comprehensive 14 point system review is negative.   PE BP (!) 153/91   Pulse 74   Ht '5\' 9"'$  (1.753 m)   Wt 191 lb 6.4 oz (86.8 kg)   BMI 28.26 kg/m    Repeat blood pressure by me was 158/88.  Wt Readings from Last 3 Encounters:  12/26/15 191 lb 6.4 oz (86.8 kg)  12/03/15 193 lb 9.6 oz (87.8 kg)  08/07/15 189 lb (85.7 kg)   General: Alert, oriented, no distress.  HEENT: Normocephalic, atraumatic. Pupils round and reactive; sclera anicteric; Nose without nasal septal hypertrophy Mouth/Parynx benign; Mallinpatti scale 3 Neck: No JVD, no carotid bruits with normal upstroke Lungs: clear to ausculatation and percussion; no wheezing or rales Chest wall: Nontender to palpation Heart: RRR, s1 s2 normal 1/6 systolic murmur.  No  S3 or S4 gallop.  No diastolic murmur.. Abdomen: soft, nontender; no hepatosplenomehaly, BS+; abdominal aorta nontender and not dilated by palpation. Pulses 2+ Extremities: no clubbinbg cyanosis or edema, Homan's sign negative  Neurologic: grossly nonfocal Psychologic: Normal affect and mood.   ECG (independently read by me): Normal sinus rhythm at 74 bpm.  No ectopy.  Normal intervals.  No ST segment changes.  October 2016 ECG (independently read by me): Normal sinus rhythm at 61 bpm.  No ectopy.  Normal intervals.  May 2016 ECG (independently read by me): Sinus bradycardia 56 bpm.  No ectopy.  Mild RV conduction delay.  QTc interval 395  ECG (independently read by me): sinus bradycardia 56 bpm.  Mild RV conduction delay.  Borderline first-degree AV block with a PR interval at 208 ms.  No ectopy.  Prior 06/05/2013 ECG (independently read by me): Normal sinus rhythm at 63 beats per minute.  No ectopy; normal intervals.  Prior November 2014 ECG: Sinus rhythm at 68beats per minute. There is mild RV conduction delay. Intervals were normal.  LABS:  BMP Latest Ref Rng & Units 12/26/2015 08/07/2015 06/28/2014  Glucose 65 - 99 mg/dL 118(H) 124(H)  143(H)  BUN 7 - 25 mg/dL 15 23(H) 28(H)  Creatinine 0.70 - 1.18 mg/dL 1.17 1.42(H) 1.31  Sodium 135 - 146 mmol/L 141 140 140  Potassium 3.5 - 5.3 mmol/L 4.7 4.4 5.0  Chloride 98 - 110 mmol/L 102 107 105  CO2 20 - 31 mmol/L '31 27 29  '$ Calcium 8.6 - 10.3 mg/dL 9.4 9.1 9.0   Hepatic Function Latest Ref Rng & Units 12/26/2015 08/07/2015 05/21/2014  Total Protein 6.1 - 8.1 g/dL 7.2 7.4 7.4  Albumin 3.6 - 5.1 g/dL 4.1 3.6 4.3  AST 10 - 35 U/L '21 21 18  '$ ALT 9 - 46 U/L 12 15(L) 11  Alk Phosphatase 40 - 115 U/L 82 70 71  Total Bilirubin 0.2 - 1.2 mg/dL 0.9 0.5 0.7  Bilirubin, Direct 0.0 - 0.3 mg/dL - - -    CBC Latest Ref Rng & Units 12/26/2015 08/07/2015 06/07/2015  WBC 3.8 - 10.8 K/uL 6.7 14.1(H) 7.0  Hemoglobin 13.2 - 17.1 g/dL 14.2 11.8(L) 14.4  Hematocrit 38.5 - 50.0 % 42.5 35.3(L) 42.9  Platelets 140 - 400 K/uL 156 170 177.0     Lipid Panel     Component Value Date/Time   CHOL 175 12/26/2015 1034   CHOL 116 03/30/2013 1053   TRIG 138 12/26/2015 1034   TRIG 80 03/30/2013 1053   HDL 32 (L) 12/26/2015 1034   HDL 34 (L) 03/30/2013 1053   CHOLHDL 5.5 (H) 12/26/2015 1034   VLDL 28 12/26/2015 1034   LDLCALC 115 (H) 12/26/2015 1034   LDLCALC 66 03/30/2013 1053     RADIOLOGY: No results found.    ASSESSMENT AND PLAN:  [name] is a 75 year old Caucasian male who is 16 years status post his CABG revascularization surgery in 2001. His last catheterization was in 2004 for recurrent chest pain which continued to show patent grafts. His ejection fraction is 40-45% which was documented on his last echo in August 2012. A myocardial perfusion study at that time showed normal perfusion. I reviewed his 5 year follow-up nuclear study that he had done in November 2016.  This was unchanged and continue to show normal perfusion and function.  His blood pressure today is elevated despite taking losartan 50 mg, isosorbide 60 mg, and Bystolic 5 mg.  I have recommended he further titrate  Bystolic IN86  mg and  increase losartan to 75 mg.  He is fasting today and repeat fasting laboratory will be obtained.  He is now on atorvastatin 40 mg for hyperlipidemia and has been tolerating this fairly well.  He takes Cymbalta for his prior  He takes gabapentin and requip for restless legs, and peripheriphal neuropathy.   His GERD is controlled on omeprazole. His weight has remained stable.  He will monitor his blood pressure.   I will contact him regarding his blood work and if adjustments to his medications are needed.  I will see him in 6 months for reevaluation.  Time spent: 25 minutes Troy Sine, MD, [facility]  12/28/2015 7:12 AM

## 2016-01-03 ENCOUNTER — Other Ambulatory Visit: Payer: Self-pay

## 2016-01-03 ENCOUNTER — Ambulatory Visit (INDEPENDENT_AMBULATORY_CARE_PROVIDER_SITE_OTHER): Payer: Medicare Other | Admitting: *Deleted

## 2016-01-03 ENCOUNTER — Telehealth: Payer: Self-pay | Admitting: Internal Medicine

## 2016-01-03 DIAGNOSIS — E538 Deficiency of other specified B group vitamins: Secondary | ICD-10-CM

## 2016-01-03 MED ORDER — CYANOCOBALAMIN 1000 MCG/ML IJ SOLN
1000.0000 ug | Freq: Once | INTRAMUSCULAR | Status: AC
  Administered 2016-01-03: 1000 ug via INTRAMUSCULAR

## 2016-01-03 MED ORDER — OXYCODONE-ACETAMINOPHEN 10-325 MG PO TABS: 1.0000 | ORAL_TABLET | Freq: Two times a day (BID) | ORAL | 0 refills | Status: DC | PRN

## 2016-01-03 NOTE — Telephone Encounter (Signed)
Pt is aware can take up to 3 days for new rx. Pt needs new rx hydrocodone and has an appt today and would like to pick up rx today

## 2016-01-03 NOTE — Telephone Encounter (Signed)
Done

## 2016-01-07 NOTE — Telephone Encounter (Signed)
Please call.

## 2016-01-12 DIAGNOSIS — R071 Chest pain on breathing: Secondary | ICD-10-CM | POA: Diagnosis not present

## 2016-01-12 DIAGNOSIS — I10 Essential (primary) hypertension: Secondary | ICD-10-CM | POA: Diagnosis not present

## 2016-01-14 ENCOUNTER — Other Ambulatory Visit: Payer: Self-pay | Admitting: Physical Medicine & Rehabilitation

## 2016-01-20 HISTORY — PX: HAND SURGERY: SHX662

## 2016-01-22 ENCOUNTER — Encounter: Payer: Medicare Other | Admitting: Physical Medicine & Rehabilitation

## 2016-01-22 ENCOUNTER — Other Ambulatory Visit: Payer: Self-pay | Admitting: *Deleted

## 2016-01-22 ENCOUNTER — Telehealth: Payer: Self-pay | Admitting: *Deleted

## 2016-01-22 DIAGNOSIS — I2583 Coronary atherosclerosis due to lipid rich plaque: Principal | ICD-10-CM

## 2016-01-22 DIAGNOSIS — I2510 Atherosclerotic heart disease of native coronary artery without angina pectoris: Secondary | ICD-10-CM

## 2016-01-22 DIAGNOSIS — Z79899 Other long term (current) drug therapy: Secondary | ICD-10-CM

## 2016-01-22 DIAGNOSIS — E782 Mixed hyperlipidemia: Secondary | ICD-10-CM

## 2016-01-22 MED ORDER — ATORVASTATIN CALCIUM 80 MG PO TABS: 80.0000 mg | ORAL_TABLET | Freq: Every day | ORAL | 6 refills | Status: DC

## 2016-01-22 NOTE — Telephone Encounter (Signed)
Patient notified of lab results and recommendations. He chose to try the increased dose of atorvastatin. He will call back if he cannot tolerate the increased dose. New prescription sent to [facility].

## 2016-01-22 NOTE — Telephone Encounter (Signed)
-----   Message from Troy Sine, MD sent at 01/22/2016 12:53 PM EST ----- Labs of x LDL increased;  Increase atorvastatin to 80 mg if on 40 mg or if not able to tolerate higher dose, add zetia 10 mg; f/u lab in 2-3 months

## 2016-01-22 NOTE — Telephone Encounter (Signed)
New atorvastatin 80 mg dose sent to [facility] on [address] per Dr Evette Georges orders.

## 2016-01-29 ENCOUNTER — Encounter: Payer: Medicare Other | Admitting: Physical Medicine & Rehabilitation

## 2016-01-30 ENCOUNTER — Other Ambulatory Visit: Payer: Self-pay | Admitting: Internal Medicine

## 2016-02-04 ENCOUNTER — Other Ambulatory Visit: Payer: Self-pay | Admitting: Internal Medicine

## 2016-02-04 ENCOUNTER — Ambulatory Visit: Payer: Medicare Other

## 2016-02-04 ENCOUNTER — Telehealth: Payer: Self-pay | Admitting: Internal Medicine

## 2016-02-04 DIAGNOSIS — E538 Deficiency of other specified B group vitamins: Secondary | ICD-10-CM

## 2016-02-04 MED ORDER — CYANOCOBALAMIN 1000 MCG/ML IJ SOLN: 1000.0000 ug | Freq: Once | INTRAMUSCULAR | 0 refills | Status: AC

## 2016-02-04 MED ORDER — OXYCODONE-ACETAMINOPHEN 10-325 MG PO TABS
1.0000 | ORAL_TABLET | Freq: Two times a day (BID) | ORAL | 0 refills | Status: DC | PRN
Start: 2016-02-04 — End: 2016-03-09

## 2016-02-04 NOTE — Telephone Encounter (Signed)
Rx was given to pt when he came in to get his vit B12 shot today.

## 2016-02-04 NOTE — Telephone Encounter (Signed)
Pt is aware his hydrocodone may not be ready today . Pt is sch for b12 inj today. Pt needs new rx hydrocodone

## 2016-02-08 ENCOUNTER — Other Ambulatory Visit: Payer: Self-pay | Admitting: Internal Medicine

## 2016-02-13 ENCOUNTER — Encounter: Payer: Medicare Other | Admitting: Physical Medicine & Rehabilitation

## 2016-03-03 ENCOUNTER — Encounter: Payer: Self-pay | Admitting: Internal Medicine

## 2016-03-03 ENCOUNTER — Ambulatory Visit (INDEPENDENT_AMBULATORY_CARE_PROVIDER_SITE_OTHER): Payer: Medicare Other | Admitting: Internal Medicine

## 2016-03-03 VITALS — BP 150/78 | HR 68 | Temp 97.8°F | Ht 69.0 in | Wt 185.4 lb

## 2016-03-03 DIAGNOSIS — I10 Essential (primary) hypertension: Secondary | ICD-10-CM

## 2016-03-03 DIAGNOSIS — J301 Allergic rhinitis due to pollen: Secondary | ICD-10-CM | POA: Diagnosis not present

## 2016-03-03 DIAGNOSIS — I255 Ischemic cardiomyopathy: Secondary | ICD-10-CM | POA: Diagnosis not present

## 2016-03-03 MED ORDER — EZETIMIBE 10 MG PO TABS: 10.0000 mg | ORAL_TABLET | Freq: Every day | ORAL | Status: DC

## 2016-03-03 MED ORDER — ATORVASTATIN CALCIUM 80 MG PO TABS: 40.0000 mg | ORAL_TABLET | Freq: Every day | ORAL | 6 refills | Status: DC

## 2016-03-03 MED ORDER — PREDNISONE 10 MG PO TABS: 10.0000 mg | ORAL_TABLET | Freq: Two times a day (BID) | ORAL | 0 refills | Status: DC

## 2016-03-03 NOTE — Progress Notes (Signed)
Subjective:    Patient ID: [name], male    DOB: 01/28/1940, 76 y.o.   MRN: [id number]  HPI  76 year old patient who is seen today complaining of sinus concerns.  He states for the past 3 or 4 months he has had increasing sinus congestion and pressure.  He has had some postnasal drip.  He has been using Nasonex.  He has avoided decongestants due to hypertension.  Blood pressure has been running a bit high He is followed closely by cardiology with ischemic cardiomyopathy.  He states that he is having worsening leg pain with atorvastatin and wishes to switch back to Vytorin.  This was discontinued in the past due to cost considerations He complains of low-grade fever.  For the past several days.  He is also had some loose stools.  Past Medical History:  Diagnosis Date  . Allergy   . Anemia   . B12 deficiency anemia   . Blood transfusion without reported diagnosis   . CAD (coronary artery disease)   . Cancer ([place])    bladder cancer 8 times  . Colon polyps   . COPD (chronic obstructive pulmonary disease) ([place])   . Depression   . Esophagitis   . Esophagus, Barrett's   . GERD (gastroesophageal reflux disease)   . History of bladder cancer   . Hyperlipidemia   . Hypertension   . Hypothyroidism   . Localized osteoarthrosis, lower leg   . Low back pain   . Malaise and fatigue   . Stenosis of esophagus      Social History   Social History  . Marital status: Married    Spouse name: N/A  . Number of children: N/A  . Years of education: N/A   Occupational History  . Not on file.   Social History Main Topics  . Smoking status: Former Smoker    Quit date: 03/30/1976  . Smokeless tobacco: Never Used     Comment: quit in 1978  . Alcohol use No  . Drug use: No  . Sexual activity: Yes   Other Topics Concern  . Not on file   Social History Narrative  . No narrative on file    Past Surgical History:  Procedure Laterality Date  . bladder cancer    . CERVICAL  DISCECTOMY     ACDF  . COLONOSCOPY  11/17/2005   normal   . CORONARY ARTERY BYPASS GRAFT     x4  . ESOPHAGOGASTRODUODENOSCOPY  04/29/2010  . KNEE ARTHROSCOPY    . LUMBAR LAMINECTOMY     and fusion  . POPLITEAL SYNOVIAL CYST EXCISION      Family History  Problem Relation Age of Onset  . Cancer Mother     melanoma  . Stroke Father   . Hypertension Father   . Coronary artery disease    . Colon cancer Neg Hx   . Esophageal cancer Neg Hx   . Stomach cancer Neg Hx   . Rectal cancer Neg Hx     Allergies  Allergen Reactions  . Adhesive [Tape]     Itching, reddened skin    Current Outpatient Prescriptions on File Prior to Visit  Medication Sig Dispense Refill  . aspirin 81 MG tablet Take 81 mg by mouth daily.      [name] atorvastatin (LIPITOR) 80 MG tablet Take 1 tablet (80 mg total) by mouth daily. 30 tablet 6  . cephALEXin (KEFLEX) 500 MG capsule     . diclofenac sodium (VOLTAREN)  1 % GEL Apply 2 g topically 4 (four) times daily. 300 g 0  . DULoxetine (CYMBALTA) 30 MG capsule TAKE ONE CAPSULE BY MOUTH ONCE DAILY 90 capsule 0  . DULoxetine (CYMBALTA) 60 MG capsule Take 1 capsule (60 mg total) by mouth daily. 30 capsule 1  . etodolac (LODINE) 300 MG capsule TAKE ONE CAPSULE BY MOUTH EVERY 8 HOURS 180 capsule 0  . etodolac (LODINE) 300 MG capsule TAKE ONE CAPSULE BY MOUTH EVERY 8 HOURS [id number] capsule 0  . folic acid (FOLVITE) A999333 MCG tablet Take 400 mcg by mouth daily.      [name] gabapentin (NEURONTIN) 600 MG tablet Take 1 tablet (600 mg total) by mouth 3 (three) times daily. 90 tablet 1  . isosorbide mononitrate (IMDUR) 60 MG 24 hr tablet TAKE ONE TABLET BY MOUTH ONCE DAILY 30 tablet 11  . losartan (COZAAR) 50 MG tablet Take 1.5 tablets daily 45 tablet 11  . nebivolol (BYSTOLIC) 10 MG tablet Take 1 tablet (10 mg total) by mouth daily. 45 tablet 11  . nitroGLYCERIN (NITROSTAT) 0.4 MG SL tablet Place 1 tablet (0.4 mg total) under the tongue every 5 (five) minutes as needed for chest pain. 30  tablet 3  . omeprazole (PRILOSEC) 10 MG capsule Take 10 mg by mouth daily.    [name] oxyCODONE-acetaminophen (PERCOCET) 10-325 MG tablet Take 1 tablet by mouth 2 (two) times daily as needed. 60 tablet 0  . rOPINIRole (REQUIP) 2 MG tablet TAKE ONE TABLET BY MOUTH AT BEDTIME. 90 tablet 1  . venlafaxine (EFFEXOR) 75 MG tablet TAKE ONE TABLET BY MOUTH ONCE DAILY. 90 tablet 3   No current facility-administered medications on file prior to visit.     BP (!) 150/78 (BP Location: Left Arm, Patient Position: Sitting, Cuff Size: Normal)   Pulse 68   Temp 97.8 F (36.6 C) (Oral)   Ht 5\' 9"  (1.753 m)   Wt 185 lb 6.4 oz (84.1 kg)   SpO2 98%   BMI 27.38 kg/m     Review of Systems  Constitutional: Positive for activity change, appetite change and fatigue. Negative for chills and fever.  HENT: Positive for congestion, postnasal drip, rhinorrhea and sinus pressure. Negative for dental problem, ear pain, hearing loss, sore throat, tinnitus, trouble swallowing and voice change.   Eyes: Negative for pain, discharge and visual disturbance.  Respiratory: Negative for cough, chest tightness, wheezing and stridor.   Cardiovascular: Negative for chest pain, palpitations and leg swelling.  Gastrointestinal: Negative for abdominal distention, abdominal pain, blood in stool, constipation, diarrhea, nausea and vomiting.  Genitourinary: Negative for difficulty urinating, discharge, flank pain, genital sores, hematuria and urgency.  Musculoskeletal: Negative for arthralgias, back pain, gait problem, joint swelling, myalgias and neck stiffness.  Skin: Negative for rash.  Neurological: Negative for dizziness, syncope, speech difficulty, weakness, numbness and headaches.  Hematological: Negative for adenopathy. Does not bruise/bleed easily.  Psychiatric/Behavioral: Negative for behavioral problems and dysphoric mood. The patient is not nervous/anxious.        Objective:   Physical Exam  Constitutional: He is  oriented to person, place, and time. He appears well-developed and well-nourished. No distress.   Afebrile Appears well No distress  HENT:  Head: Normocephalic.  Right Ear: External ear normal.  Left Ear: External ear normal.  No focal sinus tenderness  Eyes: Conjunctivae and EOM are normal.  Neck: Normal range of motion.  Cardiovascular: Normal rate and normal heart sounds.   Pulmonary/Chest: Breath sounds normal. No respiratory distress. He has  no wheezes. He has no rales.  Abdominal: Bowel sounds are normal.  Musculoskeletal: Normal range of motion. He exhibits no edema or tenderness.  Neurological: He is alert and oriented to person, place, and time.  Psychiatric: He has a normal mood and affect. His behavior is normal.          Assessment & Plan:   Sinus congestion.  Will treat with short-term oral prednisone, expectorants.  Continue nasal steroids.  Will hydrate Dyslipidemia.  Patient states that he no longer is tolerating atorvastatin and wishes to switch back to simvastatin Coronary artery disease Chronic low back pain.Follow-up chronic pain management.   [place]

## 2016-03-03 NOTE — Patient Instructions (Addendum)
Use saline irrigation, warm  moist compresses and over-the-counter decongestants only as directed.  Call if there is no improvement in 5 to 7 days, or sooner if you develop increasing pain, fever, or any new symptoms.  Hydrate and Humidify  Drink enough water to keep your urine clear or pale yellow. Staying hydrated will help to thin your mucus.  Use a cool mist humidifier to keep the humidity level in your home above 50%.  Inhale steam for 10-15 minutes, 3-4 times a day or as told by your health care provider. You can do this in the bathroom while a hot shower is running.  Limit your exposure to cool or dry air. Rest  Rest as much as possible.  Sleep with your head raised (elevated).  Make sure to get enough sleep each night. General instructions

## 2016-03-03 NOTE — Progress Notes (Signed)
Pre visit review using our clinic review tool, if applicable. No additional management support is needed unless otherwise documented below in the visit note. 

## 2016-03-04 ENCOUNTER — Other Ambulatory Visit: Payer: Self-pay | Admitting: Internal Medicine

## 2016-03-04 MED ORDER — EZETIMIBE 10 MG PO TABS: 10.0000 mg | ORAL_TABLET | Freq: Every day | ORAL | 2 refills | Status: DC

## 2016-03-04 MED ORDER — ATORVASTATIN CALCIUM 80 MG PO TABS: 40.0000 mg | ORAL_TABLET | Freq: Every day | ORAL | 3 refills | Status: DC

## 2016-03-09 ENCOUNTER — Ambulatory Visit (INDEPENDENT_AMBULATORY_CARE_PROVIDER_SITE_OTHER): Payer: Medicare Other

## 2016-03-09 ENCOUNTER — Other Ambulatory Visit: Payer: Self-pay | Admitting: Internal Medicine

## 2016-03-09 ENCOUNTER — Telehealth: Payer: Self-pay | Admitting: Internal Medicine

## 2016-03-09 DIAGNOSIS — E538 Deficiency of other specified B group vitamins: Secondary | ICD-10-CM

## 2016-03-09 MED ORDER — CYANOCOBALAMIN 1000 MCG/ML IJ SOLN
1000.0000 ug | Freq: Once | INTRAMUSCULAR | Status: AC
  Administered 2016-03-09: 1000 ug via INTRAMUSCULAR

## 2016-03-09 MED ORDER — OXYCODONE-ACETAMINOPHEN 10-325 MG PO TABS: 1.0000 | ORAL_TABLET | Freq: Two times a day (BID) | ORAL | 0 refills | Status: DC | PRN

## 2016-03-09 NOTE — Telephone Encounter (Signed)
Pt needs new rx oxycodone. Pt has an appt today at 1115 am

## 2016-03-09 NOTE — Progress Notes (Signed)
Patient received his B12 injection for his Vit B12 defeciency on 03/09/2016 at 11.20am. Given by Rosealee Albee CMA.

## 2016-03-09 NOTE — Telephone Encounter (Signed)
Pt notified Rx ready for pickup. Rx printed and signed.  

## 2016-03-31 ENCOUNTER — Ambulatory Visit (INDEPENDENT_AMBULATORY_CARE_PROVIDER_SITE_OTHER): Payer: Medicare Other

## 2016-03-31 ENCOUNTER — Telehealth: Payer: Self-pay | Admitting: Internal Medicine

## 2016-03-31 DIAGNOSIS — E538 Deficiency of other specified B group vitamins: Secondary | ICD-10-CM

## 2016-03-31 MED ORDER — CYANOCOBALAMIN 1000 MCG/ML IJ SOLN
1000.0000 ug | Freq: Once | INTRAMUSCULAR | Status: AC
  Administered 2016-03-31: 1000 ug via INTRAMUSCULAR

## 2016-03-31 NOTE — Telephone Encounter (Signed)
Too early to print may print Rx on 04/06/16  Pt made aware

## 2016-03-31 NOTE — Progress Notes (Signed)
Patient got his B12 injection on 03/31/2016 at 11.30am for his Vit B12 deficiency. Given by Rosealee Albee CMA.

## 2016-03-31 NOTE — Telephone Encounter (Signed)
Pt request refill  oxyCODONE-acetaminophen (PERCOCET) 10-325 MG tablet  Pt states he would like to pick up at his b12 inj today. However, last refill 2/19. Advised pt may not be aboe to get today.  Dr Raliegh Ip is out.

## 2016-04-06 MED ORDER — OXYCODONE-ACETAMINOPHEN 10-325 MG PO TABS: 1.0000 | ORAL_TABLET | Freq: Two times a day (BID) | ORAL | 0 refills | Status: DC | PRN

## 2016-04-06 NOTE — Telephone Encounter (Signed)
Pt notified Rx ready for pickup. Rx printed and signed.  

## 2016-04-06 NOTE — Telephone Encounter (Signed)
Rx printed awaiting to be signed.  

## 2016-04-11 ENCOUNTER — Other Ambulatory Visit: Payer: Self-pay | Admitting: Internal Medicine

## 2016-05-04 ENCOUNTER — Telehealth: Payer: Self-pay | Admitting: Internal Medicine

## 2016-05-04 NOTE — Telephone Encounter (Signed)
° ° °  Pt request refill of the following: ° °oxyCODONE-acetaminophen (PERCOCET) 10-325 MG tablet ° °Phamacy: °

## 2016-05-04 NOTE — Telephone Encounter (Signed)
Okay to refill for 30 days; patient will need to drop by the office for a signed prescription

## 2016-05-05 ENCOUNTER — Other Ambulatory Visit: Payer: Self-pay

## 2016-05-05 MED ORDER — OXYCODONE-ACETAMINOPHEN 10-325 MG PO TABS: 1.0000 | ORAL_TABLET | Freq: Two times a day (BID) | ORAL | 0 refills | Status: DC | PRN

## 2016-05-05 NOTE — Telephone Encounter (Signed)
Called patient, he is aware his rx is ready for pickup.

## 2016-05-06 ENCOUNTER — Ambulatory Visit (INDEPENDENT_AMBULATORY_CARE_PROVIDER_SITE_OTHER): Payer: Medicare Other

## 2016-05-06 DIAGNOSIS — E538 Deficiency of other specified B group vitamins: Secondary | ICD-10-CM

## 2016-05-06 MED ORDER — CYANOCOBALAMIN 1000 MCG/ML IJ SOLN
1000.0000 ug | Freq: Once | INTRAMUSCULAR | Status: AC
  Administered 2016-05-06: 1000 ug via INTRAMUSCULAR

## 2016-06-09 ENCOUNTER — Encounter: Payer: Medicare Other | Admitting: Internal Medicine

## 2016-06-11 ENCOUNTER — Telehealth: Payer: Self-pay | Admitting: Internal Medicine

## 2016-06-11 MED ORDER — OXYCODONE-ACETAMINOPHEN 10-325 MG PO TABS: 1.0000 | ORAL_TABLET | Freq: Two times a day (BID) | ORAL | 0 refills | Status: DC | PRN

## 2016-06-11 NOTE — Telephone Encounter (Signed)
Pt needs new rx percocet °

## 2016-06-11 NOTE — Telephone Encounter (Signed)
Rx is printed awaiting to be signed, Dr Raliegh Ip will return to the office on 06/16/16.

## 2016-06-11 NOTE — Telephone Encounter (Signed)
Pt notified Rx ready for pickup. Rx printed and signed.  

## 2016-06-12 ENCOUNTER — Ambulatory Visit: Payer: Medicare Other | Admitting: Family Medicine

## 2016-06-12 ENCOUNTER — Ambulatory Visit (INDEPENDENT_AMBULATORY_CARE_PROVIDER_SITE_OTHER): Payer: Medicare Other

## 2016-06-12 DIAGNOSIS — E538 Deficiency of other specified B group vitamins: Secondary | ICD-10-CM

## 2016-06-12 MED ORDER — CYANOCOBALAMIN 1000 MCG/ML IJ SOLN
1000.0000 ug | Freq: Once | INTRAMUSCULAR | Status: AC
  Administered 2016-06-12: 1000 ug via INTRAMUSCULAR

## 2016-06-16 ENCOUNTER — Encounter: Payer: Self-pay | Admitting: Gastroenterology

## 2016-07-01 ENCOUNTER — Encounter: Payer: Medicare Other | Admitting: Internal Medicine

## 2016-07-02 ENCOUNTER — Ambulatory Visit (INDEPENDENT_AMBULATORY_CARE_PROVIDER_SITE_OTHER): Payer: Medicare Other | Admitting: Internal Medicine

## 2016-07-02 ENCOUNTER — Encounter: Payer: Self-pay | Admitting: Internal Medicine

## 2016-07-02 VITALS — BP 148/68 | HR 58 | Temp 97.7°F | Ht 68.0 in | Wt 188.8 lb

## 2016-07-02 DIAGNOSIS — I10 Essential (primary) hypertension: Secondary | ICD-10-CM

## 2016-07-02 DIAGNOSIS — Z0001 Encounter for general adult medical examination with abnormal findings: Secondary | ICD-10-CM

## 2016-07-02 DIAGNOSIS — Z0000 Encounter for general adult medical examination without abnormal findings: Secondary | ICD-10-CM

## 2016-07-02 DIAGNOSIS — M1990 Unspecified osteoarthritis, unspecified site: Secondary | ICD-10-CM | POA: Diagnosis not present

## 2016-07-02 DIAGNOSIS — I2510 Atherosclerotic heart disease of native coronary artery without angina pectoris: Secondary | ICD-10-CM

## 2016-07-02 DIAGNOSIS — E7800 Pure hypercholesterolemia, unspecified: Secondary | ICD-10-CM

## 2016-07-02 DIAGNOSIS — G629 Polyneuropathy, unspecified: Secondary | ICD-10-CM | POA: Diagnosis not present

## 2016-07-02 DIAGNOSIS — E538 Deficiency of other specified B group vitamins: Secondary | ICD-10-CM

## 2016-07-02 DIAGNOSIS — E039 Hypothyroidism, unspecified: Secondary | ICD-10-CM

## 2016-07-02 NOTE — Patient Instructions (Addendum)
WE NOW OFFER   [facility] FAST TRACK!!!  SAME DAY Appointments for ACUTE CARE  Such as: Sprains, Injuries, cuts, abrasions, rashes, muscle pain, joint pain, back pain Colds, flu, sore throats, headache, allergies, cough, fever  Ear pain, sinus and eye infections Abdominal pain, nausea, vomiting, diarrhea, upset stomach Animal/insect bites  3 Easy Ways to Schedule: Walk-In Scheduling Call in scheduling Mychart Sign-up: [email or web address]   Limit your sodium (Salt) intake    It is important that you exercise regularly, at least 20 minutes 3 to 4 times per week.  If you develop chest pain or shortness of breath seek  medical attention.  Schedule your colonoscopy to help detect colon cancer.  Urology follow-up as scheduled  Return in 6 months for follow-up

## 2016-07-02 NOTE — Progress Notes (Signed)
Subjective:    Patient ID: [name], male    DOB: September 01, 1940, 76 y.o.   MRN: [id number]  HPI  76 year old patient who is seen today for a annual health examination and subsequent Medicare wellness visit. He is followed by cardiology for coronary artery disease with ischemic cardiomyopathy. He has a history of Barrett's esophagus and is also followed by GI. He has significant arthritis and chronic low back pain as well as a peripheral neuropathy and is on chronic narcotics. He has COPD. He continues to have significant low back pain His cardiac status has been stable  He states he generally uses oxycodone 1 half tablet 2 or 3 times daily  Past Medical History:  Diagnosis Date  . Allergy   . Anemia   . B12 deficiency anemia   . Blood transfusion without reported diagnosis   . CAD (coronary artery disease)   . Cancer ([place])    bladder cancer 8 times  . Colon polyps   . COPD (chronic obstructive pulmonary disease) ([place])   . Depression   . Esophagitis   . Esophagus, Barrett's   . GERD (gastroesophageal reflux disease)   . History of bladder cancer   . Hyperlipidemia   . Hypertension   . Hypothyroidism   . Localized osteoarthrosis, lower leg   . Low back pain   . Malaise and fatigue   . Stenosis of esophagus      Social History   Social History  . Marital status: Married    Spouse name: N/A  . Number of children: N/A  . Years of education: N/A   Occupational History  . Not on file.   Social History Main Topics  . Smoking status: Former Smoker    Quit date: 03/30/1976  . Smokeless tobacco: Never Used     Comment: quit in 1978  . Alcohol use No  . Drug use: No  . Sexual activity: Yes   Other Topics Concern  . Not on file   Social History Narrative  . No narrative on file    Past Surgical History:  Procedure Laterality Date  . bladder cancer    . CERVICAL DISCECTOMY     ACDF  . COLONOSCOPY  11/17/2005   normal   . CORONARY ARTERY BYPASS GRAFT      x4  . ESOPHAGOGASTRODUODENOSCOPY  04/29/2010  . KNEE ARTHROSCOPY    . LUMBAR LAMINECTOMY     and fusion  . POPLITEAL SYNOVIAL CYST EXCISION      Family History  Problem Relation Age of Onset  . Cancer Mother        melanoma  . Stroke Father   . Hypertension Father   . Coronary artery disease Unknown   . Colon cancer Neg Hx   . Esophageal cancer Neg Hx   . Stomach cancer Neg Hx   . Rectal cancer Neg Hx     Allergies  Allergen Reactions  . Adhesive [Tape]     Itching, reddened skin    Current Outpatient Prescriptions on File Prior to Visit  Medication Sig Dispense Refill  . aspirin 81 MG tablet Take 81 mg by mouth daily.      . cephALEXin (KEFLEX) 500 MG capsule     . diclofenac sodium (VOLTAREN) 1 % GEL Apply 2 g topically 4 (four) times daily. 300 g 0  . DULoxetine (CYMBALTA) 30 MG capsule TAKE ONE CAPSULE BY MOUTH ONCE DAILY 90 capsule 0  . DULoxetine (CYMBALTA) 60 MG capsule Take  1 capsule (60 mg total) by mouth daily. 30 capsule 1  . etodolac (LODINE) 300 MG capsule TAKE ONE CAPSULE BY MOUTH EVERY 8 HOURS 180 capsule 0  . etodolac (LODINE) 300 MG capsule TAKE ONE CAPSULE BY MOUTH EVERY 8 HOURS 180 capsule 0  . ezetimibe (ZETIA) 10 MG tablet Take 1 tablet (10 mg total) by mouth daily. 90 tablet 2  . folic acid (FOLVITE) 174 MCG tablet Take 400 mcg by mouth daily.      [name] gabapentin (NEURONTIN) 600 MG tablet Take 1 tablet (600 mg total) by mouth 3 (three) times daily. 90 tablet 1  . isosorbide mononitrate (IMDUR) 60 MG 24 hr tablet TAKE ONE TABLET BY MOUTH ONCE DAILY 30 tablet 11  . losartan (COZAAR) 50 MG tablet Take 1.5 tablets daily 45 tablet 11  . nebivolol (BYSTOLIC) 10 MG tablet Take 1 tablet (10 mg total) by mouth daily. 45 tablet 11  . nitroGLYCERIN (NITROSTAT) 0.4 MG SL tablet Place 1 tablet (0.4 mg total) under the tongue every 5 (five) minutes as needed for chest pain. 30 tablet 3  . omeprazole (PRILOSEC) 10 MG capsule Take 10 mg by mouth daily.    [name]  oxyCODONE-acetaminophen (PERCOCET) 10-325 MG tablet Take 1 tablet by mouth 2 (two) times daily as needed. 60 tablet 0  . predniSONE (DELTASONE) 10 MG tablet Take 1 tablet (10 mg total) by mouth 2 (two) times daily with a meal. 20 tablet 0  . rOPINIRole (REQUIP) 2 MG tablet TAKE ONE TABLET BY MOUTH AT BEDTIME. 90 tablet 1  . venlafaxine (EFFEXOR) 75 MG tablet TAKE ONE TABLET BY MOUTH ONCE DAILY. 90 tablet 3  . atorvastatin (LIPITOR) 80 MG tablet Take 0.5 tablets (40 mg total) by mouth daily. 90 tablet 3   Current Facility-Administered Medications on File Prior to Visit  Medication Dose Route Frequency Provider Last Rate Last Dose  . ezetimibe (ZETIA) tablet 10 mg  10 mg Oral Daily Marletta Lor, MD        BP (!) 148/68 (BP Location: Left Arm, Patient Position: Sitting, Cuff Size: Normal)   Pulse (!) 58   Temp 97.7 F (36.5 C) (Oral)   Ht 5\' 8"  (1.727 m)   Wt 188 lb 12.8 oz (85.6 kg)   SpO2 97%   BMI 28.71 kg/m   Medicare wellness visit  1. Risk factors, based on past  M,S,F history.  Patient has known coronary artery disease with ischemic cardio myopathy, status post CABG  2.  Physical activities: patient states that he is able to walk on a level surface and limited only by arthritic pain does have some DOE with inclines  3.  Depression/mood:no history of major depression or mood disorder  4.  Hearing:no major deficits  5.  ADL's:independent  6.  Fall risk:low to moderate due to arthritic pain  7.  Home safety:no problems identified  8.  Height weight, and visual acuity;height and weight stable no change in visual acuity.  No recent eye examinations.  9.  Counseling:continue heart healthy diet.  More rigorous exercise encouraged  10. Lab orders based on risk factors:laboratory profile including lipid panel will be reviewed  11. Referral :urology, cardiology and GI  12. Care plan:patient needs follow-up colonoscopy  13. Cognitive assessment: alert and appropriate,  normal affect no cognitive dysfunction  14. Screening: Patient provided with a written and personalized 5-10 year screening schedule in the AVS.    15. Provider List Update: primary care cardiology GI urology  Review of Systems  Constitutional: Negative for appetite change, chills, fatigue and fever.  HENT: Negative for congestion, dental problem, ear pain, hearing loss, sore throat, tinnitus, trouble swallowing and voice change.   Eyes: Negative for pain, discharge and visual disturbance.  Respiratory: Negative for cough, chest tightness, wheezing and stridor.   Cardiovascular: Negative for chest pain, palpitations and leg swelling.  Gastrointestinal: Negative for abdominal distention, abdominal pain, blood in stool, constipation, diarrhea, nausea and vomiting.  Genitourinary: Negative for difficulty urinating, discharge, flank pain, genital sores, hematuria and urgency.  Musculoskeletal: Positive for arthralgias and back pain. Negative for gait problem, joint swelling, myalgias and neck stiffness.  Skin: Positive for rash.  Neurological: Negative for dizziness, syncope, speech difficulty, weakness, numbness and headaches.  Hematological: Negative for adenopathy. Does not bruise/bleed easily.  Psychiatric/Behavioral: Negative for behavioral problems and dysphoric mood. The patient is not nervous/anxious.        Objective:   Physical Exam  Constitutional: He appears well-developed and well-nourished.  HENT:  Head: Normocephalic and atraumatic.  Right Ear: External ear normal.  Left Ear: External ear normal.  Nose: Nose normal.  Mouth/Throat: Oropharynx is clear and moist.  edentulous  Eyes: Conjunctivae and EOM are normal. Pupils are equal, round, and reactive to light. No scleral icterus.  Neck: Normal range of motion. Neck supple. No JVD present. No thyromegaly present.  Cardiovascular: Regular rhythm, normal heart sounds and intact distal pulses.  Exam reveals no gallop and  no friction rub.   No murmur heard. Status post sternotomy Dorsalis pedis pulses full.  Posterior tibial pulses not easily palpable  Pulmonary/Chest: Effort normal and breath sounds normal. He exhibits no tenderness.  Abdominal: Soft. Bowel sounds are normal. He exhibits no distension and no mass. There is no tenderness.  Genitourinary: Penis normal.  Genitourinary Comments: Right testicular enlargement with hardware in place  Musculoskeletal: Normal range of motion. He exhibits no edema or tenderness.  Lymphadenopathy:    He has no cervical adenopathy.  Neurological: He is alert. He has normal reflexes. No cranial nerve deficit. Coordination normal.  Decreased vibratory sensation  Skin: Skin is warm and dry. No rash noted.  Right upper quadrant scar as well as scar involving the left medial knee  Psychiatric: He has a normal mood and affect. His behavior is normal.          Assessment & Plan:   Preventive health examination Assessment Medicare wellness visit Essential hypertension, stable Coronary artery disease Health maintenance.  Will schedule follow-up colonoscopy Chronic pain/osteoarthritis Peripheral neuropathy  Follow-up 6 months Follow-up multiple consultants We'll review laboratory studies  Nyoka Cowden

## 2016-07-13 ENCOUNTER — Ambulatory Visit (INDEPENDENT_AMBULATORY_CARE_PROVIDER_SITE_OTHER): Payer: Medicare Other

## 2016-07-13 ENCOUNTER — Telehealth: Payer: Self-pay | Admitting: Internal Medicine

## 2016-07-13 DIAGNOSIS — E538 Deficiency of other specified B group vitamins: Secondary | ICD-10-CM | POA: Diagnosis not present

## 2016-07-13 MED ORDER — CYANOCOBALAMIN 1000 MCG/ML IJ SOLN
1000.0000 ug | Freq: Once | INTRAMUSCULAR | Status: AC
  Administered 2016-07-13: 1000 ug via INTRAMUSCULAR

## 2016-07-13 MED ORDER — OXYCODONE-ACETAMINOPHEN 10-325 MG PO TABS
1.0000 | ORAL_TABLET | Freq: Two times a day (BID) | ORAL | 0 refills | Status: DC | PRN
Start: 2016-07-13 — End: 2016-08-20

## 2016-07-13 NOTE — Telephone Encounter (Signed)
Rx printed waiting to be signed.  

## 2016-07-13 NOTE — Telephone Encounter (Signed)
Pt need new Rx for Oxycodone   Pt is aware of 3 business days for refills and someone will call when ready for pick up. °

## 2016-07-13 NOTE — Progress Notes (Signed)
Pt was in today. Vitamin B12 injection given in right deltoid. Patient tolerated well.

## 2016-07-13 NOTE — Telephone Encounter (Signed)
Pt notified Rx ready for pickup. Rx printed and signed.  

## 2016-07-28 ENCOUNTER — Encounter: Payer: Self-pay | Admitting: Cardiovascular Disease

## 2016-07-28 ENCOUNTER — Ambulatory Visit (INDEPENDENT_AMBULATORY_CARE_PROVIDER_SITE_OTHER): Payer: Medicare Other | Admitting: Cardiovascular Disease

## 2016-07-28 VITALS — BP 156/68 | HR 53 | Ht 68.0 in | Wt 187.8 lb

## 2016-07-28 DIAGNOSIS — I255 Ischemic cardiomyopathy: Secondary | ICD-10-CM

## 2016-07-28 DIAGNOSIS — E038 Other specified hypothyroidism: Secondary | ICD-10-CM

## 2016-07-28 DIAGNOSIS — I25810 Atherosclerosis of coronary artery bypass graft(s) without angina pectoris: Secondary | ICD-10-CM

## 2016-07-28 DIAGNOSIS — M791 Myalgia, unspecified site: Secondary | ICD-10-CM

## 2016-07-28 DIAGNOSIS — Z79899 Other long term (current) drug therapy: Secondary | ICD-10-CM

## 2016-07-28 DIAGNOSIS — E785 Hyperlipidemia, unspecified: Secondary | ICD-10-CM

## 2016-07-28 DIAGNOSIS — R0602 Shortness of breath: Secondary | ICD-10-CM

## 2016-07-28 DIAGNOSIS — I10 Essential (primary) hypertension: Secondary | ICD-10-CM

## 2016-07-28 MED ORDER — LOSARTAN POTASSIUM 100 MG PO TABS: 100.0000 mg | ORAL_TABLET | Freq: Every day | ORAL | 3 refills | Status: DC

## 2016-07-28 MED ORDER — ROSUVASTATIN CALCIUM 20 MG PO TABS: 20.0000 mg | ORAL_TABLET | Freq: Every day | ORAL | 3 refills | Status: DC

## 2016-07-28 NOTE — Progress Notes (Signed)
Patient ID: [name], male   DOB: Jul 09, 1940, 76 y.o.   MRN: [id number]      HPI: [name] is a 76 y.o. male who presents to the office today for an 8 month cardiology followup evaluation.  [name] has known CAD and underwent CABG revascularization surgery in 2001 with a LIMA to his LAD, vein to the marginal, vein to the RCA. Catheterization in 2004 showed patent grafts. An echo Doppler study in August 2012 revealed an ejection fraction of 40-45% with moderate septal hypokinesis with grade 1 diastolic dysfunction as well as aortic valve sclerosis. Additional problems include GERD, mild emphysema, as well as obstructive sleep apnea. He no longer utilizes CPAP. He has lost approximately 25 pounds since his wife's death from a motor vehicle accident in January 2013.  He remains active and notes mild shortness of breath with activity particularly when walking up hills but this has not changed.  He also is a history of restless leg syndrome and has benefited with requip. Laboratory in 2013 demonstrated LDL 57 with LDL particle #1035, small LDL particle number was 611, slightly increased, HDLC was 35 triglycerides 93 total cholesterol 111 initial resistance course slightly elevated at 53.  BP blood work in March 2015 revealed normal renal function.  Fasting glucose was minimally increased at 106.  Total cholesterol was 116 triglycerides 80, and LDL 66, with an HDL of 34.  LDL particle number was slightly increased at 1190.  He undewent a five-year follow-up nuclear perfusion study in November 2016.  Ejection fraction was 54%.  He had normal perfusion.  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  He admits to arthralgias without myalgias.  He has issues with his in his legs and his peripheral neuropathy.  When I last saw him, I further titrated his losartan to 75 mg and Bystolic to 10 mg.  He believes his blood pressure and  pulse have been better.  He admits to leg discomfort which seems worse with atorvastatin.  He has noticed some shortness of breath with a pill walking and with exertion but denies recurrent episodes of chest tightness.   He denies PND, orthopnea.  He presents for evaluation.  Past Medical History:  Diagnosis Date  . Allergy   . Anemia   . B12 deficiency anemia   . Blood transfusion without reported diagnosis   . CAD (coronary artery disease)   . Cancer ([place])    bladder cancer 8 times  . Colon polyps   . COPD (chronic obstructive pulmonary disease) ([place])   . Depression   . Esophagitis   . Esophagus, Barrett's   . GERD (gastroesophageal reflux disease)   . History of bladder cancer   . Hyperlipidemia   . Hypertension   . Hypothyroidism   . Localized osteoarthrosis, lower leg   . Low back pain   . Malaise and fatigue   . Stenosis of esophagus     Past Surgical History:  Procedure Laterality Date  . bladder cancer    . CERVICAL DISCECTOMY     ACDF  . COLONOSCOPY  11/17/2005   normal   . CORONARY ARTERY BYPASS GRAFT     x4  . ESOPHAGOGASTRODUODENOSCOPY  04/29/2010  . KNEE ARTHROSCOPY    . LUMBAR LAMINECTOMY     and fusion  . POPLITEAL SYNOVIAL CYST EXCISION      Allergies  Allergen Reactions  . Adhesive [Tape]  Itching, reddened skin    Current Outpatient Prescriptions  Medication Sig Dispense Refill  . aspirin 81 MG tablet Take 81 mg by mouth daily.      . Cyanocobalamin (B-12 COMPLIANCE INJECTION IJ) Inject 500 mg as directed every 30 (thirty) days.    . diclofenac sodium (VOLTAREN) 1 % GEL Apply 2 g topically 4 (four) times daily. 300 g 0  . DULoxetine (CYMBALTA) 60 MG capsule Take 1 capsule (60 mg total) by mouth daily. 30 capsule 1  . etodolac (LODINE) 300 MG capsule TAKE ONE CAPSULE BY MOUTH EVERY 8 HOURS 180 capsule 0  . ezetimibe (ZETIA) 10 MG tablet Take 1 tablet (10 mg total) by mouth daily. 90 tablet 2  . folic acid (FOLVITE) 989 MCG tablet Take 400  mcg by mouth daily.      [name] gabapentin (NEURONTIN) 600 MG tablet Take 1 tablet (600 mg total) by mouth 3 (three) times daily. 90 tablet 1  . isosorbide mononitrate (IMDUR) 60 MG 24 hr tablet TAKE ONE TABLET BY MOUTH ONCE DAILY 30 tablet 11  . nebivolol (BYSTOLIC) 10 MG tablet Take 1 tablet (10 mg total) by mouth daily. 45 tablet 11  . nitroGLYCERIN (NITROSTAT) 0.4 MG SL tablet Place 1 tablet (0.4 mg total) under the tongue every 5 (five) minutes as needed for chest pain. 30 tablet 3  . omeprazole (PRILOSEC) 10 MG capsule Take 10 mg by mouth daily.    [name] oxyCODONE-acetaminophen (PERCOCET) 10-325 MG tablet Take 1 tablet by mouth 2 (two) times daily as needed. 60 tablet 0  . rOPINIRole (REQUIP) 2 MG tablet TAKE ONE TABLET BY MOUTH AT BEDTIME. 90 tablet 1  . venlafaxine (EFFEXOR) 75 MG tablet TAKE ONE TABLET BY MOUTH ONCE DAILY. 90 tablet 3  . losartan (COZAAR) 100 MG tablet Take 1 tablet (100 mg total) by mouth daily. 90 tablet 3  . rosuvastatin (CRESTOR) 20 MG tablet Take 1 tablet (20 mg total) by mouth daily. 90 tablet 3   No current facility-administered medications for this visit.    Socially he is widowed in January 2013. He has 3 children 6 grandchildren. 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 There is no recent tobacco use.  no alcohol  ROS General: Negative; No fevers, chills, or night sweats;  HEENT: Negative; No changes in vision or hearing, sinus congestion, difficulty swallowing Pulmonary: Negative; No cough, wheezing, shortness of breath, hemoptysis Cardiovascular: Negative; No chest pain, presyncope, syncope, palpatations.  Minimal shortness of breath with activity GI: GERD; No nausea, vomiting, diarrhea, or abdominal pain GU: Negative; No dysuria, hematuria, or difficulty voiding Musculoskeletal: Positive for multiple joint aches involving his legs, hips, back, and shoulder Hematologic/Oncology:  Negative; no easy bruising, bleeding Endocrine: Negative; no heat/cold intolerance; no diabetes Neuro: Negative; no changes in balance, headaches Skin: Negative; No rashes or skin lesions Psychiatric: Negative; No behavioral problems, depression Sleep: He no longer uses CPAP with his 25 pound weight loss.  His restless legs improved with low-dose Requip; No snoring, daytime sleepiness, hypersomnolence, bruxism, hypnogognic hallucinations, no cataplexy Other comprehensive 14 point system review is negative.   PE BP (!) 156/68   Pulse (!) 53   Ht '5\' 8"'$  (1.727 m)   Wt 187 lb 12.8 oz (85.2 kg)   BMI 28.55 kg/m    Repeat blood pressure by me was 140/70  Wt Readings from Last 3 Encounters:  07/28/16 187 lb  12.8 oz (85.2 kg)  07/02/16 188 lb 12.8 oz (85.6 kg)  03/03/16 185 lb 6.4 oz (84.1 kg)    General: Alert, oriented, no distress.  Skin: normal turgor, no rashes, warm and dry HEENT: Normocephalic, atraumatic. Pupils equal round and reactive to light; sclera anicteric; extraocular muscles intact;  Nose without nasal septal hypertrophy Mouth/Parynx benign; Mallinpatti scale 3 Neck: No JVD, no carotid bruits; normal carotid upstroke Lungs: clear to ausculatation and percussion; no wheezing or rales Chest wall: without tenderness to palpitation Heart: PMI not displaced, RRR, s1 s2 normal, 1/6 systolic murmur, no diastolic murmur, no rubs, gallops, thrills, or heaves Abdomen: soft, nontender; no hepatosplenomehaly, BS+; abdominal aorta nontender and not dilated by palpation. Back: no CVA tenderness Pulses 2+ Musculoskeletal: full range of motion, normal strength, no joint deformities Extremities: no clubbing cyanosis or edema, Homan's sign negative  Neurologic: grossly nonfocal; Cranial nerves grossly wnl Psychologic: Normal mood and affect  ECG (independently read by me): Sinus bradycardia 53 bpm.  No ectopy.  Normal intervals.  No ST segment changes.  December 2017 ECG  (independently read by me): Normal sinus rhythm at 74 bpm.  No ectopy.  Normal intervals.  No ST segment changes.  October 2016 ECG (independently read by me): Normal sinus rhythm at 61 bpm.  No ectopy.  Normal intervals.  May 2016 ECG (independently read by me): Sinus bradycardia 56 bpm.  No ectopy.  Mild RV conduction delay.  QTc interval 395  ECG (independently read by me): sinus bradycardia 56 bpm.  Mild RV conduction delay.  Borderline first-degree AV block with a PR interval at 208 ms.  No ectopy.  Prior 06/05/2013 ECG (independently read by me): Normal sinus rhythm at 63 beats per minute.  No ectopy; normal intervals.  Prior November 2014 ECG: Sinus rhythm at 68beats per minute. There is mild RV conduction delay. Intervals were normal.  LABS:  BMP Latest Ref Rng & Units 12/26/2015 08/07/2015 06/28/2014  Glucose 65 - 99 mg/dL 118(H) 124(H) 143(H)  BUN 7 - 25 mg/dL 15 23(H) 28(H)  Creatinine 0.70 - 1.18 mg/dL 1.17 1.42(H) 1.31  Sodium 135 - 146 mmol/L 141 140 140  Potassium 3.5 - 5.3 mmol/L 4.7 4.4 5.0  Chloride 98 - 110 mmol/L 102 107 105  CO2 20 - 31 mmol/L '31 27 29  '$ Calcium 8.6 - 10.3 mg/dL 9.4 9.1 9.0   Hepatic Function Latest Ref Rng & Units 12/26/2015 08/07/2015 05/21/2014  Total Protein 6.1 - 8.1 g/dL 7.2 7.4 7.4  Albumin 3.6 - 5.1 g/dL 4.1 3.6 4.3  AST 10 - 35 U/L '21 21 18  '$ ALT 9 - 46 U/L 12 15(L) 11  Alk Phosphatase 40 - 115 U/L 82 70 71  Total Bilirubin 0.2 - 1.2 mg/dL 0.9 0.5 0.7  Bilirubin, Direct 0.0 - 0.3 mg/dL - - -    CBC Latest Ref Rng & Units 12/26/2015 08/07/2015 06/07/2015  WBC 3.8 - 10.8 K/uL 6.7 14.1(H) 7.0  Hemoglobin 13.2 - 17.1 g/dL 14.2 11.8(L) 14.4  Hematocrit 38.5 - 50.0 % 42.5 35.3(L) 42.9  Platelets 140 - 400 K/uL 156 170 177.0     Lipid Panel     Component Value Date/Time   CHOL 175 12/26/2015 1034   CHOL 116 03/30/2013 1053   TRIG 138 12/26/2015 1034   TRIG 80 03/30/2013 1053   HDL 32 (L) 12/26/2015 1034   HDL 34 (L) 03/30/2013 1053    CHOLHDL 5.5 (H) 12/26/2015 1034   VLDL 28 12/26/2015 1034  LDLCALC 115 (H) 12/26/2015 1034   LDLCALC 66 03/30/2013 1053     RADIOLOGY: No results found.  IMPRESSION:  1. Essential hypertension   2. Coronary artery disease involving coronary bypass graft of native heart without angina pectoris   3. Cardiomyopathy, ischemic   4. Other specified hypothyroidism   5. Hyperlipidemia LDL goal <70   6. Medication management   7. SOB (shortness of breath)   8. Myalgia     ASSESSMENT AND PLAN:  [name] is a 76 year old Caucasian male who is 17 years status post his CABG revascularization surgery in 2001. His last catheterization was in 2004 for recurrent chest pain which continued to show patent grafts. His ejection fraction is 40-45% which was documented on his last echo in August 2012. A myocardial perfusion study at that time showed normal perfusion. I reviewed his 5 year follow-up nuclear study that he had done in November 2016.  This was unchanged and continue to show normal perfusion and function.  At his last office visit, I further titrated his medications for more optimal blood pressure control.  Blood pressure today is improved but still slightly elevated and I will further increase losartan to 100 mg daily.  He is bradycardic on his current dose of nebivolol but is doing well.  He is not having anginal symptoms and continues to be on isosorbide 60 mg in addition to his beta blocker therapy along with losartan.  He is having issues and he believes his leg discomfort is worse since being switched to atorvastatin.  As result, I will discontinue this and switch this to Crestor 20 mg daily.  He will continue his current dose of Zetia 10 mg.  I am recommending a follow-up echo Doppler study to reassess his shortness of breath, and both systolic and diastolic function.  This will be done in October 2018, and I will see him in follow-up for further evaluation. Time spent: 25 minutes Troy Sine, MD, [facility]  07/30/2016 11:10 PM

## 2016-07-28 NOTE — Patient Instructions (Signed)
Medication Instructions:   STOP ATORVASTATIN. This has been replaced with rosuvastatin.  The losartan has been increased from 50 mg to 100 mg daily.  Labwork:  Prior to next office visit. Lab slips provided.  Testing/Procedures:  Your physician has requested that you have an echocardiogram. Echocardiography is a painless test that uses sound waves to create images of your heart. It provides your doctor with information about the size and shape of your heart and how well your heart's chambers and valves are working. This procedure takes approximately one hour. There are no restrictions for this procedure.  THIS WILL BE  DONE IN OCTOBER  Follow-Up:  OCTOBER  Any Other Special Instructions Will Be Listed Below (If Applicable).

## 2016-08-03 ENCOUNTER — Encounter: Payer: Self-pay | Admitting: Internal Medicine

## 2016-08-16 ENCOUNTER — Other Ambulatory Visit: Payer: Self-pay | Admitting: Internal Medicine

## 2016-08-20 ENCOUNTER — Telehealth: Payer: Self-pay | Admitting: Internal Medicine

## 2016-08-20 MED ORDER — OXYCODONE-ACETAMINOPHEN 10-325 MG PO TABS: 1.0000 | ORAL_TABLET | Freq: Two times a day (BID) | ORAL | 0 refills | Status: DC | PRN

## 2016-08-20 NOTE — Telephone Encounter (Signed)
Rx printed, awaiting to be signed  

## 2016-08-20 NOTE — Telephone Encounter (Signed)
°  Pt is getting a b12 at 11:45 and would like to pick up below med at that time    Pt request refill of the following:  oxyCODONE-acetaminophen (PERCOCET) 10-325 MG tablet   Phamacy:

## 2016-08-21 ENCOUNTER — Ambulatory Visit (INDEPENDENT_AMBULATORY_CARE_PROVIDER_SITE_OTHER): Payer: Medicare Other

## 2016-08-21 DIAGNOSIS — E538 Deficiency of other specified B group vitamins: Secondary | ICD-10-CM | POA: Diagnosis not present

## 2016-08-21 MED ORDER — CYANOCOBALAMIN 1000 MCG/ML IJ SOLN
1000.0000 ug | Freq: Once | INTRAMUSCULAR | Status: AC
  Administered 2016-08-21: 1000 ug via INTRAMUSCULAR

## 2016-08-21 NOTE — Telephone Encounter (Signed)
Pt notified Rx ready for pickup. Rx printed and signed.  

## 2016-09-23 ENCOUNTER — Telehealth: Payer: Self-pay | Admitting: Internal Medicine

## 2016-09-23 NOTE — Telephone Encounter (Signed)
Pt request refill   oxyCODONE-acetaminophen (PERCOCET) 10-325 MG tablet  Pt is coming in tomorrow 9/6 for b12 inj at 11:45 and would like to pick up at that time.

## 2016-09-24 ENCOUNTER — Ambulatory Visit (INDEPENDENT_AMBULATORY_CARE_PROVIDER_SITE_OTHER): Payer: Medicare Other

## 2016-09-24 DIAGNOSIS — E538 Deficiency of other specified B group vitamins: Secondary | ICD-10-CM | POA: Diagnosis not present

## 2016-09-24 MED ORDER — CYANOCOBALAMIN 1000 MCG/ML IJ SOLN
1000.0000 ug | Freq: Once | INTRAMUSCULAR | Status: AC
  Administered 2016-09-24: 1000 ug via INTRAMUSCULAR

## 2016-09-24 MED ORDER — OXYCODONE-ACETAMINOPHEN 10-325 MG PO TABS: 1.0000 | ORAL_TABLET | Freq: Two times a day (BID) | ORAL | 0 refills | Status: DC | PRN

## 2016-09-24 NOTE — Telephone Encounter (Signed)
Rx printed, awaiting to be signed  

## 2016-09-24 NOTE — Telephone Encounter (Signed)
Pt notified Rx ready for pickup. Rx printed and signed.  

## 2016-10-16 ENCOUNTER — Other Ambulatory Visit: Payer: Self-pay | Admitting: Internal Medicine

## 2016-10-26 ENCOUNTER — Encounter: Payer: Self-pay | Admitting: Internal Medicine

## 2016-10-26 ENCOUNTER — Ambulatory Visit (INDEPENDENT_AMBULATORY_CARE_PROVIDER_SITE_OTHER): Payer: Medicare Other | Admitting: Internal Medicine

## 2016-10-26 ENCOUNTER — Telehealth: Payer: Self-pay

## 2016-10-26 ENCOUNTER — Ambulatory Visit (INDEPENDENT_AMBULATORY_CARE_PROVIDER_SITE_OTHER): Payer: Medicare Other

## 2016-10-26 VITALS — BP 148/62 | HR 67 | Temp 98.2°F | Ht 68.0 in | Wt 187.6 lb

## 2016-10-26 DIAGNOSIS — Z79899 Other long term (current) drug therapy: Secondary | ICD-10-CM | POA: Diagnosis not present

## 2016-10-26 DIAGNOSIS — E538 Deficiency of other specified B group vitamins: Secondary | ICD-10-CM | POA: Diagnosis not present

## 2016-10-26 DIAGNOSIS — G8929 Other chronic pain: Secondary | ICD-10-CM

## 2016-10-26 DIAGNOSIS — M545 Low back pain: Secondary | ICD-10-CM | POA: Diagnosis not present

## 2016-10-26 DIAGNOSIS — Z23 Encounter for immunization: Secondary | ICD-10-CM | POA: Diagnosis not present

## 2016-10-26 DIAGNOSIS — G894 Chronic pain syndrome: Secondary | ICD-10-CM | POA: Diagnosis not present

## 2016-10-26 MED ORDER — NITROGLYCERIN 0.4 MG SL SUBL: 0.4000 mg | SUBLINGUAL_TABLET | SUBLINGUAL | 3 refills | Status: DC | PRN

## 2016-10-26 MED ORDER — CYANOCOBALAMIN 1000 MCG/ML IJ SOLN
1000.0000 ug | Freq: Once | INTRAMUSCULAR | Status: AC
  Administered 2016-10-26: 1000 ug via INTRAMUSCULAR

## 2016-10-26 MED ORDER — OXYCODONE-ACETAMINOPHEN 10-325 MG PO TABS: 1.0000 | ORAL_TABLET | Freq: Two times a day (BID) | ORAL | 0 refills | Status: DC | PRN

## 2016-10-26 NOTE — Progress Notes (Signed)
   Subjective:    Patient ID: [name], male    DOB: [birth year], 76 y.o.   MRN: [id number]  HPI  76 year old patient who is seen today for a pain management visit. He has significant arthritis and chronic low back pain as well as a peripheral neuropathy and is on chronic narcotics  He continues to have significant low back pain as well as significant knee and hip pain He states he generally uses oxycodone 1 half tablet 2 or 3 times daily  [place] printed, initialed  and scanned into the EMR.  Indication for chronic opioid: chronic back pain as well as generalized arthritis affecting primarily the hips and the knees Medication and dose: Percocet 1 half to 1 tablet 2-3 times daily # pills per month: maximum 90 per month Last UDS date: 10/26/2016 Pain contract signed (Y/N): pain contract signed 10/26/2016 Date narcotic database last reviewed (include red flags): 10/26/2016  Review of Systems     Objective:   Physical Exam        Assessment & Plan:   Encounter for chronic pain management (G89.29) Narcotic use  (711.90) Pain management contract signed (Z02.89)  Nyoka Cowden

## 2016-10-26 NOTE — Telephone Encounter (Signed)
Pain management visit is required by law

## 2016-10-26 NOTE — Telephone Encounter (Signed)
Pt is coming in for B12 today and also wants to pick up rx for Percocet. Pt advised we usually allow 3 business days for rx refill but he states this is usually how his is done.   Dr. Raliegh Ip - Please advise on refill. Thanks!

## 2016-10-26 NOTE — Telephone Encounter (Signed)
Appt scheduled for this afternoon.

## 2016-10-26 NOTE — Patient Instructions (Signed)
Call or return to clinic prn if these symptoms worsen or fail to improve as anticipated.  Return in 3 months for follow-up

## 2016-10-28 ENCOUNTER — Ambulatory Visit (HOSPITAL_COMMUNITY): Payer: Medicare Other | Attending: Cardiology

## 2016-10-28 ENCOUNTER — Other Ambulatory Visit: Payer: Self-pay

## 2016-10-28 DIAGNOSIS — R0602 Shortness of breath: Secondary | ICD-10-CM | POA: Diagnosis not present

## 2016-10-28 DIAGNOSIS — I255 Ischemic cardiomyopathy: Secondary | ICD-10-CM

## 2016-10-28 DIAGNOSIS — I420 Dilated cardiomyopathy: Secondary | ICD-10-CM | POA: Insufficient documentation

## 2016-10-28 DIAGNOSIS — I5030 Unspecified diastolic (congestive) heart failure: Secondary | ICD-10-CM | POA: Diagnosis not present

## 2016-10-30 LAB — PAIN MGMT, PROFILE 8 W/CONF, U
6 ACETYLMORPHINE: NEGATIVE ng/mL (ref <10)
ALCOHOL METABOLITES: NEGATIVE ng/mL (ref <500)
Amphetamines: NEGATIVE ng/mL (ref <500)
Benzodiazepines: NEGATIVE ng/mL (ref <100)
Buprenorphine, Urine: NEGATIVE ng/mL (ref <5)
CODEINE: NEGATIVE ng/mL (ref <50)
CREATININE: 124.2 mg/dL
Cocaine Metabolite: NEGATIVE ng/mL (ref <150)
HYDROMORPHONE: NEGATIVE ng/mL (ref <50)
Hydrocodone: NEGATIVE ng/mL (ref <50)
MDMA: NEGATIVE ng/mL (ref <500)
Marijuana Metabolite: NEGATIVE ng/mL (ref <20)
Morphine: NEGATIVE ng/mL (ref <50)
NOROXYCODONE: 1414 ng/mL — AB (ref <50)
Norhydrocodone: NEGATIVE ng/mL (ref <50)
OXYCODONE: 920 ng/mL — AB (ref <50)
OXYCODONE: POSITIVE ng/mL — AB (ref <100)
OXYMORPHONE: 1192 ng/mL — AB (ref <50)
Opiates: NEGATIVE ng/mL (ref <100)
Oxidant: NEGATIVE ug/mL (ref <200)
pH: 5.44 (ref 4.5–9.0)

## 2016-10-31 ENCOUNTER — Other Ambulatory Visit: Payer: Self-pay | Admitting: Internal Medicine

## 2016-11-03 ENCOUNTER — Encounter: Payer: Self-pay | Admitting: Cardiovascular Disease

## 2016-11-03 ENCOUNTER — Ambulatory Visit (INDEPENDENT_AMBULATORY_CARE_PROVIDER_SITE_OTHER): Payer: Medicare Other | Admitting: Cardiovascular Disease

## 2016-11-03 VITALS — BP 150/74 | HR 59 | Ht 69.0 in | Wt 186.2 lb

## 2016-11-03 DIAGNOSIS — I519 Heart disease, unspecified: Secondary | ICD-10-CM

## 2016-11-03 DIAGNOSIS — I10 Essential (primary) hypertension: Secondary | ICD-10-CM

## 2016-11-03 DIAGNOSIS — I25810 Atherosclerosis of coronary artery bypass graft(s) without angina pectoris: Secondary | ICD-10-CM

## 2016-11-03 DIAGNOSIS — E785 Hyperlipidemia, unspecified: Secondary | ICD-10-CM

## 2016-11-03 DIAGNOSIS — R0602 Shortness of breath: Secondary | ICD-10-CM | POA: Diagnosis not present

## 2016-11-03 DIAGNOSIS — K219 Gastro-esophageal reflux disease without esophagitis: Secondary | ICD-10-CM

## 2016-11-03 DIAGNOSIS — I5189 Other ill-defined heart diseases: Secondary | ICD-10-CM

## 2016-11-03 NOTE — Progress Notes (Signed)
Patient ID: [name], male   DOB: 12-Mar-1940, 76 y.o.   MRN: [id number]      HPI: [name] is a 76 y.o. male who presents to the office today for a 3 month cardiology followup evaluation.  [name] has known CAD and underwent CABG revascularization surgery in 2001 with a LIMA to his LAD, vein to the marginal, vein to the RCA. Catheterization in 2004 showed patent grafts. An echo Doppler study in August 2012 revealed an ejection fraction of 40-45% with moderate septal hypokinesis with grade 1 diastolic dysfunction as well as aortic valve sclerosis. Additional problems include GERD, mild emphysema, as well as obstructive sleep apnea. He no longer utilizes CPAP. He has lost approximately 25 pounds since his wife's death from a motor vehicle accident in January 2013.  He remains active and notes mild shortness of breath with activity particularly when walking up hills but this has not changed.  He also is a history of restless leg syndrome and has benefited with requip. Laboratory in 2013 demonstrated LDL 57 with LDL particle #1035, small LDL particle number was 611, slightly increased, HDLC was 35 triglycerides 93 total cholesterol 111 initial resistance course slightly elevated at 53.  BP blood work in March 2015 revealed normal renal function.  Fasting glucose was minimally increased at 106.  Total cholesterol was 116 triglycerides 80, and LDL 66, with an HDL of 34.  LDL particle number was slightly increased at 1190.  He undewent a five-year follow-up nuclear perfusion study in November 2016.  Ejection fraction was 54%.  He had normal perfusion.  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  He admits to arthralgias without myalgias.  He has issues with his in his legs and his peripheral neuropathy.  I further titrated his losartan to 75 mg and Bystolic to 10 mg.  He believes his blood pressure and pulse have been better.   He admits to leg discomfort which seems worse with atorvastatin.  When I last saw him, he had noticed some shortness of breath with uphill walking and with exertion but denied recurrent episodes of chest tightness.   He denied PND, orthopnea.  He underwent a follow-up echo Doppler study on 10/28/2016.  Ejection fraction was excellent at 60-65%.  There was grade 2 diastolic dysfunction.  There was evidence for aortic sclerosis without stenosis.  There was mild LA dilation.  He had trivial PR.  He has continued to do well.  This past weekend after that her cane.  He was cutting trees that had fallen on his property and worked hard all day without chest pain.  He was supposed to follow-up laboratory but this was never completed.  He presents for reevaluation.  Past Medical History:  Diagnosis Date  . Allergy   . Anemia   . B12 deficiency anemia   . Blood transfusion without reported diagnosis   . CAD (coronary artery disease)   . Cancer ([place])    bladder cancer 8 times  . Colon polyps   . COPD (chronic obstructive pulmonary disease) ([place])   . Depression   . Esophagitis   . Esophagus, Barrett's   . GERD (gastroesophageal reflux disease)   . History of bladder cancer   . Hyperlipidemia   . Hypertension   . Hypothyroidism   . Localized osteoarthrosis, lower leg   . Low back pain   . Malaise and fatigue   . Stenosis  of esophagus     Past Surgical History:  Procedure Laterality Date  . bladder cancer    . CERVICAL DISCECTOMY     ACDF  . COLONOSCOPY  11/17/2005   normal   . CORONARY ARTERY BYPASS GRAFT     x4  . ESOPHAGOGASTRODUODENOSCOPY  04/29/2010  . KNEE ARTHROSCOPY    . LUMBAR LAMINECTOMY     and fusion  . POPLITEAL SYNOVIAL CYST EXCISION      Allergies  Allergen Reactions  . Adhesive [Tape]     Itching, reddened skin    Current Outpatient Prescriptions  Medication Sig Dispense Refill  . aspirin 81 MG tablet Take 81 mg by mouth daily.      . Cyanocobalamin (B-12  COMPLIANCE INJECTION IJ) Inject 500 mg as directed every 30 (thirty) days.    . diclofenac sodium (VOLTAREN) 1 % GEL Apply 2 g topically 4 (four) times daily. 300 g 0  . etodolac (LODINE) 300 MG capsule TAKE ONE CAPSULE BY MOUTH EVERY 8 HOURS 180 capsule 1  . ezetimibe (ZETIA) 10 MG tablet TAKE 1 TABLET BY MOUTH DAILY 90 tablet 2  . folic acid (FOLVITE) 400 MCG tablet Take 400 mcg by mouth daily.      [name] gabapentin (NEURONTIN) 600 MG tablet Take 1 tablet (600 mg total) by mouth 3 (three) times daily. 90 tablet 1  . isosorbide mononitrate (IMDUR) 60 MG 24 hr tablet TAKE ONE TABLET BY MOUTH ONCE DAILY 30 tablet 11  . nebivolol (BYSTOLIC) 10 MG tablet Take 1 tablet (10 mg total) by mouth daily. 45 tablet 11  . nitroGLYCERIN (NITROSTAT) 0.4 MG SL tablet Place 1 tablet (0.4 mg total) under the tongue every 5 (five) minutes as needed for chest pain. 30 tablet 3  . omeprazole (PRILOSEC) 10 MG capsule Take 10 mg by mouth daily.    [name] oxyCODONE-acetaminophen (PERCOCET) 10-325 MG tablet Take 1 tablet by mouth 2 (two) times daily as needed. 180 tablet 0  . rOPINIRole (REQUIP) 2 MG tablet TAKE 1 TABLET BY MOUTH AT BEDTIME 90 tablet 1  . venlafaxine (EFFEXOR) 75 MG tablet TAKE ONE TABLET BY MOUTH ONCE DAILY. 90 tablet 3  . losartan (COZAAR) 100 MG tablet Take 1 tablet (100 mg total) by mouth daily. 90 tablet 3  . rosuvastatin (CRESTOR) 20 MG tablet Take 1 tablet (20 mg total) by mouth daily. 90 tablet 3   No current facility-administered medications for this visit.    Socially he is widowed in January 2013. He has 3 children 6 grandchildren. 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 There is no recent tobacco use.  no alcohol  ROS General: Negative; No fevers, chills, or night sweats;  HEENT: Negative; No changes in vision or hearing, sinus congestion, difficulty swallowing Pulmonary: Negative; No cough, wheezing, shortness of  breath, hemoptysis Cardiovascular: Negative; No chest pain, presyncope, syncope, palpatations.  Minimal shortness of breath with activity GI: GERD; No nausea, vomiting, diarrhea, or abdominal pain GU: Negative; No dysuria, hematuria, or difficulty voiding Musculoskeletal: Positive for multiple joint aches involving his legs, hips, back, and shoulder Hematologic/Oncology: Negative; no easy bruising, bleeding Endocrine: Negative; no heat/cold intolerance; no diabetes Neuro: Negative; no changes in balance, headaches Skin: Negative; No rashes or skin lesions Psychiatric: Negative; No behavioral problems, depression Sleep: He no longer uses CPAP with his 25 pound weight loss.  His restless legs improved with low-dose Requip; No  snoring, daytime sleepiness, hypersomnolence, bruxism, hypnogognic hallucinations, no cataplexy Other comprehensive 14 point system review is negative.   PE BP (!) 150/74   Pulse (!) 59   Ht '5\' 9"'$  (1.753 m)   Wt 186 lb 3.2 oz (84.5 kg)   BMI 27.50 kg/m    Repeat blood pressure by me was 130/74  Wt Readings from Last 3 Encounters:  11/03/16 186 lb 3.2 oz (84.5 kg)  10/26/16 187 lb 9.6 oz (85.1 kg)  07/28/16 187 lb 12.8 oz (85.2 kg)   General: Alert, oriented, no distress.  Skin: normal turgor, no rashes, warm and dry HEENT: Normocephalic, atraumatic. Pupils equal round and reactive to light; sclera anicteric; extraocular muscles intact;  Nose without nasal septal hypertrophy Mouth/Parynx benign; Mallinpatti scale 3 Neck: No JVD, no carotid bruits; normal carotid upstroke Lungs: clear to ausculatation and percussion; no wheezing or rales Chest wall: without tenderness to palpitation Heart: PMI not displaced, RRR, s1 s2 normal, 1/6 systolic murmur, no diastolic murmur, no rubs, gallops, thrills, or heaves Abdomen: soft, nontender; no hepatosplenomehaly, BS+; abdominal aorta nontender and not dilated by palpation. Back: no CVA tenderness Pulses  2+ Musculoskeletal: full range of motion, normal strength, no joint deformities Extremities: no clubbing cyanosis or edema, Homan's sign negative  Neurologic: grossly nonfocal; Cranial nerves grossly wnl Psychologic: Normal mood and affect   July 2-018 ECG (independently read by me): Sinus bradycardia 53 bpm.  No ectopy.  Normal intervals.  No ST segment changes.  December 2017 ECG (independently read by me): Normal sinus rhythm at 74 bpm.  No ectopy.  Normal intervals.  No ST segment changes.  October 2016 ECG (independently read by me): Normal sinus rhythm at 61 bpm.  No ectopy.  Normal intervals.  May 2016 ECG (independently read by me): Sinus bradycardia 56 bpm.  No ectopy.  Mild RV conduction delay.  QTc interval 395  ECG (independently read by me): sinus bradycardia 56 bpm.  Mild RV conduction delay.  Borderline first-degree AV block with a PR interval at 208 ms.  No ectopy.  Prior 06/05/2013 ECG (independently read by me): Normal sinus rhythm at 63 beats per minute.  No ectopy; normal intervals.  Prior November 2014 ECG: Sinus rhythm at 68beats per minute. There is mild RV conduction delay. Intervals were normal.  LABS:  BMP Latest Ref Rng & Units 12/26/2015 08/07/2015 06/28/2014  Glucose 65 - 99 mg/dL 118(H) 124(H) 143(H)  BUN 7 - 25 mg/dL 15 23(H) 28(H)  Creatinine 0.70 - 1.18 mg/dL 1.17 1.42(H) 1.31  Sodium 135 - 146 mmol/L 141 140 140  Potassium 3.5 - 5.3 mmol/L 4.7 4.4 5.0  Chloride 98 - 110 mmol/L 102 107 105  CO2 20 - 31 mmol/L '31 27 29  '$ Calcium 8.6 - 10.3 mg/dL 9.4 9.1 9.0   Hepatic Function Latest Ref Rng & Units 12/26/2015 08/07/2015 05/21/2014  Total Protein 6.1 - 8.1 g/dL 7.2 7.4 7.4  Albumin 3.6 - 5.1 g/dL 4.1 3.6 4.3  AST 10 - 35 U/L '21 21 18  '$ ALT 9 - 46 U/L 12 15(L) 11  Alk Phosphatase 40 - 115 U/L 82 70 71  Total Bilirubin 0.2 - 1.2 mg/dL 0.9 0.5 0.7  Bilirubin, Direct 0.0 - 0.3 mg/dL - - -    CBC Latest Ref Rng & Units 12/26/2015 08/07/2015 06/07/2015  WBC 3.8  - 10.8 K/uL 6.7 14.1(H) 7.0  Hemoglobin 13.2 - 17.1 g/dL 14.2 11.8(L) 14.4  Hematocrit 38.5 - 50.0 % 42.5 35.3(L) 42.9  Platelets 140 -  400 K/uL 156 170 177.0     Lipid Panel     Component Value Date/Time   CHOL 175 12/26/2015 1034   CHOL 116 03/30/2013 1053   TRIG 138 12/26/2015 1034   TRIG 80 03/30/2013 1053   HDL 32 (L) 12/26/2015 1034   HDL 34 (L) 03/30/2013 1053   CHOLHDL 5.5 (H) 12/26/2015 1034   VLDL 28 12/26/2015 1034   LDLCALC 115 (H) 12/26/2015 1034   LDLCALC 66 03/30/2013 1053     RADIOLOGY: No results found.  IMPRESSION:  1. Essential hypertension   2. Coronary artery disease involving coronary bypass graft of native heart without angina pectoris   3. Hyperlipidemia LDL goal <70   4. SOB (shortness of breath)   5. Grade II diastolic dysfunction   6. Gastroesophageal reflux disease without esophagitis     ASSESSMENT AND PLAN:  [name] is a 76 year old Caucasian male who is 17 years status post his CABG revascularization surgery in 2001. His last catheterization was in 2004 for recurrent chest pain which continued to show patent grafts. His ejection fraction is 40-45% which was documented on his last echo in August 2012. A myocardial perfusion study at that time showed normal perfusion. A  5 year follow-up nuclear study  in November 2016 was unchanged and showed normal perfusion and function.  On the last several office visits.  I titrated his medications for more optimal blood pressure control.  His blood pressure today on repeat by me was improved to 138/74, although I did suggest ideal would be less than 580 systolically.  He continues to be on losartan 998 mg, Bystolic 10 mg, and isosorbide 60 mg.  He is not having any anginal symptoms.  I reviewed his follow-up echo Doppler study.  This confirms normal systolic function with grade 2 diastolic dysfunction which may be a contributor in his dyspnea with activity.  However, this past weekend he was able to work  hard in his yard, cutting trees and did well.  He continues to be on Zetia 10 mg and rosuvastatin 20 mg for hyperlipidemia.  His LDL in December 2017 was elevated at 1:15.  He did not have his follow-up laboratory.  I'm recommending a complete set of laboratory be obtained in the fasting state.  If LDL continues to be greater than 70.  I will attempt further medication titration.  He continues to be on Effexor daily for mild anxiety/depression and is doing well.  His GERD is controlled with omeprazole.  He has restless legs on room.  All in addition to gabapentin.  He admits to low back pain and knee arthritic discomfort which sometimes limits his ability to exercise as he had in the past.  I will contact him regarding his laboratory results.  I will see him in 6 months for reevaluation.  This was unchanged and continue to show normal perfusion and function.  At his last office visit, I further titrated his medications for more optimal blood pressure control.  Blood pressure today is improved but still slightly elevated and I will further increase losartan to 100 mg daily.  He is bradycardic on his current dose of nebivolol but is doing well.  He is not having anginal symptoms and continues to be on isosorbide 60 mg in addition to his beta blocker therapy along with losartan.  He is having issues and he believes his leg discomfort is worse since being switched to atorvastatin.  As result, I will discontinue this and  switch this to Crestor 20 mg daily.  He will continue his current dose of Zetia 10 mg.  I am recommending a follow-up echo Doppler study to reassess his shortness of breath, and both systolic and diastolic function.  This will be done in October 2018, and I will see him in follow-up for further evaluation. Time spent: 25 minutes Troy Sine, MD, [facility]  11/03/2016 8:38 AM

## 2016-11-03 NOTE — Patient Instructions (Signed)
Medication Instructions:  Your physician recommends that you continue on your current medications as directed. Please refer to the Current Medication list given to you today.  Labwork: Please return for FASTING labs (CMET, CBC, Lipid, TSH)  Our in office lab hours are Monday-Friday 8:00-4:30, closed for lunch 1-2 pm.  No appointment needed.  Follow-Up: Your physician wants you to follow-up in: 6 months with Dr. Claiborne Billings.  You will receive a reminder letter in the mail two months in advance. If you don't receive a letter, please call our office to schedule the follow-up appointment.   Any Other Special Instructions Will Be Listed Below (If Applicable).     If you need a refill on your cardiac medications before your next appointment, please call your pharmacy.

## 2016-11-03 NOTE — Addendum Note (Signed)
Addended by: Patria Mane A on: 11/03/2016 09:11 AM   Modules accepted: Orders

## 2016-11-24 ENCOUNTER — Observation Stay (HOSPITAL_COMMUNITY)
Admission: EM | Admit: 2016-11-24 | Discharge: 2016-11-25 | Disposition: A | Discharge status: Home / Self Care | Payer: Medicare Other | Attending: Orthopedic Surgery | Admitting: Orthopedic Surgery

## 2016-11-24 ENCOUNTER — Other Ambulatory Visit: Payer: Self-pay

## 2016-11-24 ENCOUNTER — Encounter (HOSPITAL_COMMUNITY)
Admission: EM | Disposition: A | Discharge status: Home / Self Care | Payer: Self-pay | Source: Home / Self Care | Attending: Emergency Medicine

## 2016-11-24 ENCOUNTER — Encounter (HOSPITAL_COMMUNITY): Payer: Self-pay | Admitting: Emergency Medicine

## 2016-11-24 ENCOUNTER — Emergency Department (HOSPITAL_COMMUNITY): Payer: Medicare Other

## 2016-11-24 ENCOUNTER — Emergency Department (HOSPITAL_COMMUNITY): Payer: Medicare Other | Admitting: Certified Registered"

## 2016-11-24 ENCOUNTER — Telehealth: Payer: Self-pay

## 2016-11-24 DIAGNOSIS — S66222A Laceration of extensor muscle, fascia and tendon of left thumb at wrist and hand level, initial encounter: Secondary | ICD-10-CM | POA: Insufficient documentation

## 2016-11-24 DIAGNOSIS — I2510 Atherosclerotic heart disease of native coronary artery without angina pectoris: Secondary | ICD-10-CM | POA: Diagnosis not present

## 2016-11-24 DIAGNOSIS — G609 Hereditary and idiopathic neuropathy, unspecified: Secondary | ICD-10-CM | POA: Diagnosis not present

## 2016-11-24 DIAGNOSIS — E039 Hypothyroidism, unspecified: Secondary | ICD-10-CM | POA: Diagnosis not present

## 2016-11-24 DIAGNOSIS — G2581 Restless legs syndrome: Secondary | ICD-10-CM | POA: Insufficient documentation

## 2016-11-24 DIAGNOSIS — S62631A Displaced fracture of distal phalanx of left index finger, initial encounter for closed fracture: Secondary | ICD-10-CM | POA: Diagnosis not present

## 2016-11-24 DIAGNOSIS — S62623A Displaced fracture of medial phalanx of left middle finger, initial encounter for closed fracture: Secondary | ICD-10-CM | POA: Diagnosis not present

## 2016-11-24 DIAGNOSIS — S66121A Laceration of flexor muscle, fascia and tendon of left index finger at wrist and hand level, initial encounter: Secondary | ICD-10-CM | POA: Diagnosis not present

## 2016-11-24 DIAGNOSIS — Z87891 Personal history of nicotine dependence: Secondary | ICD-10-CM | POA: Diagnosis not present

## 2016-11-24 DIAGNOSIS — S62633A Displaced fracture of distal phalanx of left middle finger, initial encounter for closed fracture: Secondary | ICD-10-CM | POA: Diagnosis not present

## 2016-11-24 DIAGNOSIS — Z8551 Personal history of malignant neoplasm of bladder: Secondary | ICD-10-CM | POA: Diagnosis not present

## 2016-11-24 DIAGNOSIS — W312XXA Contact with powered woodworking and forming machines, initial encounter: Secondary | ICD-10-CM | POA: Insufficient documentation

## 2016-11-24 DIAGNOSIS — Z951 Presence of aortocoronary bypass graft: Secondary | ICD-10-CM | POA: Diagnosis not present

## 2016-11-24 DIAGNOSIS — S62621A Displaced fracture of medial phalanx of left index finger, initial encounter for closed fracture: Secondary | ICD-10-CM | POA: Diagnosis not present

## 2016-11-24 DIAGNOSIS — S6992XA Unspecified injury of left wrist, hand and finger(s), initial encounter: Secondary | ICD-10-CM | POA: Diagnosis not present

## 2016-11-24 DIAGNOSIS — Z79899 Other long term (current) drug therapy: Secondary | ICD-10-CM | POA: Insufficient documentation

## 2016-11-24 DIAGNOSIS — S62633B Displaced fracture of distal phalanx of left middle finger, initial encounter for open fracture: Secondary | ICD-10-CM | POA: Insufficient documentation

## 2016-11-24 DIAGNOSIS — D519 Vitamin B12 deficiency anemia, unspecified: Secondary | ICD-10-CM | POA: Insufficient documentation

## 2016-11-24 DIAGNOSIS — S62621B Displaced fracture of medial phalanx of left index finger, initial encounter for open fracture: Secondary | ICD-10-CM | POA: Diagnosis not present

## 2016-11-24 DIAGNOSIS — S61217A Laceration without foreign body of left little finger without damage to nail, initial encounter: Secondary | ICD-10-CM | POA: Diagnosis not present

## 2016-11-24 DIAGNOSIS — Z7982 Long term (current) use of aspirin: Secondary | ICD-10-CM | POA: Diagnosis not present

## 2016-11-24 DIAGNOSIS — K219 Gastro-esophageal reflux disease without esophagitis: Secondary | ICD-10-CM | POA: Diagnosis not present

## 2016-11-24 DIAGNOSIS — S62603B Fracture of unspecified phalanx of left middle finger, initial encounter for open fracture: Secondary | ICD-10-CM

## 2016-11-24 DIAGNOSIS — S62623B Displaced fracture of medial phalanx of left middle finger, initial encounter for open fracture: Secondary | ICD-10-CM | POA: Insufficient documentation

## 2016-11-24 DIAGNOSIS — F329 Major depressive disorder, single episode, unspecified: Secondary | ICD-10-CM | POA: Diagnosis not present

## 2016-11-24 DIAGNOSIS — S62522B Displaced fracture of distal phalanx of left thumb, initial encounter for open fracture: Principal | ICD-10-CM | POA: Insufficient documentation

## 2016-11-24 DIAGNOSIS — I255 Ischemic cardiomyopathy: Secondary | ICD-10-CM | POA: Insufficient documentation

## 2016-11-24 DIAGNOSIS — T07XXXA Unspecified multiple injuries, initial encounter: Secondary | ICD-10-CM

## 2016-11-24 DIAGNOSIS — S61211A Laceration without foreign body of left index finger without damage to nail, initial encounter: Secondary | ICD-10-CM | POA: Diagnosis not present

## 2016-11-24 DIAGNOSIS — S62631B Displaced fracture of distal phalanx of left index finger, initial encounter for open fracture: Secondary | ICD-10-CM | POA: Diagnosis not present

## 2016-11-24 DIAGNOSIS — S62601B Fracture of unspecified phalanx of left index finger, initial encounter for open fracture: Secondary | ICD-10-CM

## 2016-11-24 DIAGNOSIS — J449 Chronic obstructive pulmonary disease, unspecified: Secondary | ICD-10-CM | POA: Diagnosis not present

## 2016-11-24 DIAGNOSIS — S62609B Fracture of unspecified phalanx of unspecified finger, initial encounter for open fracture: Secondary | ICD-10-CM | POA: Diagnosis present

## 2016-11-24 DIAGNOSIS — E785 Hyperlipidemia, unspecified: Secondary | ICD-10-CM | POA: Insufficient documentation

## 2016-11-24 DIAGNOSIS — S61012A Laceration without foreign body of left thumb without damage to nail, initial encounter: Secondary | ICD-10-CM | POA: Diagnosis not present

## 2016-11-24 DIAGNOSIS — I10 Essential (primary) hypertension: Secondary | ICD-10-CM | POA: Diagnosis not present

## 2016-11-24 HISTORY — PX: I & D EXTREMITY: SHX5045

## 2016-11-24 HISTORY — PX: I&D EXTREMITY: SHX5045

## 2016-11-24 LAB — BASIC METABOLIC PANEL
Anion gap: 6 (ref 5–15)
BUN: 18 mg/dL (ref 6–20)
CO2: 26 mmol/L (ref 22–32)
Calcium: 8.6 mg/dL — ABNORMAL LOW (ref 8.9–10.3)
Chloride: 107 mmol/L (ref 101–111)
Creatinine, Ser: 1.42 mg/dL — ABNORMAL HIGH (ref 0.61–1.24)
GFR calc Af Amer: 55 mL/min — ABNORMAL LOW (ref >60)
GFR calc non Af Amer: 47 mL/min — ABNORMAL LOW (ref >60)
Glucose, Bld: 128 mg/dL — ABNORMAL HIGH (ref 65–99)
Potassium: 4.1 mmol/L (ref 3.5–5.1)
Sodium: 139 mmol/L (ref 135–145)

## 2016-11-24 LAB — CBC WITH DIFFERENTIAL/PLATELET
Basophils Absolute: 0 10*3/uL (ref 0.0–0.1)
Basophils Relative: 1 %
Eosinophils Absolute: 0.3 10*3/uL (ref 0.0–0.7)
Eosinophils Relative: 6 %
HCT: 33.7 % — ABNORMAL LOW (ref 39.0–52.0)
Hemoglobin: 11 g/dL — ABNORMAL LOW (ref 13.0–17.0)
Lymphocytes Relative: 28 %
Lymphs Abs: 1.6 10*3/uL (ref 0.7–4.0)
MCH: 29 pg (ref 26.0–34.0)
MCHC: 32.6 g/dL (ref 30.0–36.0)
MCV: 88.9 fL (ref 78.0–100.0)
Monocytes Absolute: 0.5 10*3/uL (ref 0.1–1.0)
Monocytes Relative: 8 %
Neutro Abs: 3.4 10*3/uL (ref 1.7–7.7)
Neutrophils Relative %: 57 %
Platelets: 137 10*3/uL — ABNORMAL LOW (ref 150–400)
RBC: 3.79 MIL/uL — ABNORMAL LOW (ref 4.22–5.81)
RDW: 14.1 % (ref 11.5–15.5)
WBC: 5.9 10*3/uL (ref 4.0–10.5)

## 2016-11-24 SURGERY — IRRIGATION AND DEBRIDEMENT EXTREMITY
Anesthesia: Monitor Anesthesia Care | Laterality: Left

## 2016-11-24 MED ORDER — ROSUVASTATIN CALCIUM 20 MG PO TABS
20.0000 mg | ORAL_TABLET | Freq: Every day | ORAL | Status: DC
  Administered 2016-11-25: 20 mg via ORAL
  Filled 2016-11-24: qty 1

## 2016-11-24 MED ORDER — ONDANSETRON HCL 4 MG/2ML IJ SOLN: 4.0000 mg | Freq: Four times a day (QID) | INTRAMUSCULAR | Status: DC | PRN

## 2016-11-24 MED ORDER — OXYCODONE HCL 5 MG PO TABS
5.0000 mg | ORAL_TABLET | ORAL | Status: DC | PRN
  Administered 2016-11-25: 10 mg via ORAL
  Filled 2016-11-24: qty 2

## 2016-11-24 MED ORDER — PROPOFOL 10 MG/ML IV BOLUS
INTRAVENOUS | Status: AC
  Filled 2016-11-24: qty 20

## 2016-11-24 MED ORDER — MEPERIDINE HCL 25 MG/ML IJ SOLN: 6.2500 mg | INTRAMUSCULAR | Status: DC | PRN

## 2016-11-24 MED ORDER — ONDANSETRON HCL 4 MG/2ML IJ SOLN
INTRAMUSCULAR | Status: DC | PRN
  Administered 2016-11-24: 4 mg via INTRAVENOUS

## 2016-11-24 MED ORDER — MORPHINE SULFATE (PF) 4 MG/ML IV SOLN
6.0000 mg | Freq: Once | INTRAVENOUS | Status: DC
Start: 2016-11-24 — End: 2016-11-24

## 2016-11-24 MED ORDER — TETANUS-DIPHTH-ACELL PERTUSSIS 5-2.5-18.5 LF-MCG/0.5 IM SUSP
0.5000 mL | Freq: Once | INTRAMUSCULAR | Status: AC
  Administered 2016-11-24: 0.5 mL via INTRAMUSCULAR
  Filled 2016-11-24: qty 0.5

## 2016-11-24 MED ORDER — BUPIVACAINE HCL (PF) 0.25 % IJ SOLN
INTRAMUSCULAR | Status: AC
  Filled 2016-11-24: qty 30

## 2016-11-24 MED ORDER — MORPHINE SULFATE (PF) 4 MG/ML IV SOLN: 1.0000 mg | INTRAVENOUS | Status: DC | PRN

## 2016-11-24 MED ORDER — GABAPENTIN 600 MG PO TABS
600.0000 mg | ORAL_TABLET | Freq: Three times a day (TID) | ORAL | Status: DC
  Administered 2016-11-25 (×2): 600 mg via ORAL
  Filled 2016-11-24 (×2): qty 1

## 2016-11-24 MED ORDER — ISOSORBIDE MONONITRATE ER 60 MG PO TB24
60.0000 mg | ORAL_TABLET | Freq: Every day | ORAL | Status: DC
  Administered 2016-11-25: 60 mg via ORAL
  Filled 2016-11-24: qty 1

## 2016-11-24 MED ORDER — FENTANYL CITRATE (PF) 250 MCG/5ML IJ SOLN
INTRAMUSCULAR | Status: AC
  Filled 2016-11-24: qty 5

## 2016-11-24 MED ORDER — SODIUM CHLORIDE 0.9 % IR SOLN
Status: DC | PRN
  Administered 2016-11-24: 6000 mL

## 2016-11-24 MED ORDER — HYDROMORPHONE HCL 1 MG/ML IJ SOLN: 0.2500 mg | INTRAMUSCULAR | Status: DC | PRN

## 2016-11-24 MED ORDER — LIDOCAINE HCL (CARDIAC) 20 MG/ML IV SOLN
INTRAVENOUS | Status: DC | PRN
  Administered 2016-11-24: 30 mg via INTRATRACHEAL

## 2016-11-24 MED ORDER — VENLAFAXINE HCL 37.5 MG PO TABS
75.0000 mg | ORAL_TABLET | Freq: Every day | ORAL | Status: DC
  Administered 2016-11-25: 75 mg via ORAL
  Filled 2016-11-24: qty 2

## 2016-11-24 MED ORDER — NEBIVOLOL HCL 10 MG PO TABS
10.0000 mg | ORAL_TABLET | Freq: Every day | ORAL | Status: DC
  Administered 2016-11-25: 10 mg via ORAL
  Filled 2016-11-24: qty 1

## 2016-11-24 MED ORDER — 0.9 % SODIUM CHLORIDE (POUR BTL) OPTIME
TOPICAL | Status: DC | PRN
  Administered 2016-11-24: 1000 mL

## 2016-11-24 MED ORDER — ASPIRIN 81 MG PO CHEW
81.0000 mg | CHEWABLE_TABLET | Freq: Every day | ORAL | Status: DC
  Administered 2016-11-25: 81 mg via ORAL
  Filled 2016-11-24: qty 1

## 2016-11-24 MED ORDER — PROPOFOL 10 MG/ML IV BOLUS
INTRAVENOUS | Status: DC | PRN
  Administered 2016-11-24 (×16): 20 mg via INTRAVENOUS

## 2016-11-24 MED ORDER — LACTATED RINGERS IV SOLN
INTRAVENOUS | Status: DC | PRN
  Administered 2016-11-24: 20:00:00 via INTRAVENOUS

## 2016-11-24 MED ORDER — SODIUM CHLORIDE 0.9 % IV SOLN
INTRAVENOUS | Status: DC
  Administered 2016-11-24: 17:00:00 via INTRAVENOUS

## 2016-11-24 MED ORDER — SODIUM CHLORIDE 0.45 % IV SOLN
INTRAVENOUS | Status: DC
  Administered 2016-11-25: via INTRAVENOUS

## 2016-11-24 MED ORDER — DEXAMETHASONE SODIUM PHOSPHATE 10 MG/ML IJ SOLN
INTRAMUSCULAR | Status: DC | PRN
  Administered 2016-11-24: 10 mg via INTRAVENOUS

## 2016-11-24 MED ORDER — CEFAZOLIN SODIUM-DEXTROSE 1-4 GM/50ML-% IV SOLN
1.0000 g | INTRAVENOUS | Status: AC
  Administered 2016-11-25: 1 g via INTRAVENOUS
  Filled 2016-11-24: qty 50

## 2016-11-24 MED ORDER — METHOCARBAMOL 500 MG PO TABS: 500.0000 mg | ORAL_TABLET | Freq: Four times a day (QID) | ORAL | Status: DC | PRN

## 2016-11-24 MED ORDER — ROPINIROLE HCL 1 MG PO TABS
2.0000 mg | ORAL_TABLET | Freq: Every day | ORAL | Status: DC
Start: 2016-11-24 — End: 2016-11-25
  Administered 2016-11-25: 2 mg via ORAL
  Filled 2016-11-24: qty 2

## 2016-11-24 MED ORDER — METHOCARBAMOL 1000 MG/10ML IJ SOLN: 500.0000 mg | Freq: Four times a day (QID) | INTRAVENOUS | Status: DC | PRN

## 2016-11-24 MED ORDER — ONDANSETRON HCL 4 MG/2ML IJ SOLN: 4.0000 mg | Freq: Once | INTRAMUSCULAR | Status: DC | PRN

## 2016-11-24 MED ORDER — BUPIVACAINE-EPINEPHRINE (PF) 0.5% -1:200000 IJ SOLN
INTRAMUSCULAR | Status: DC | PRN
  Administered 2016-11-24: 30 mL via PERINEURAL

## 2016-11-24 MED ORDER — LOSARTAN POTASSIUM 50 MG PO TABS
100.0000 mg | ORAL_TABLET | Freq: Every day | ORAL | Status: DC
  Administered 2016-11-25: 100 mg via ORAL
  Filled 2016-11-24: qty 2

## 2016-11-24 MED ORDER — CEFAZOLIN SODIUM-DEXTROSE 2-4 GM/100ML-% IV SOLN
2.0000 g | Freq: Once | INTRAVENOUS | Status: AC
  Administered 2016-11-24: 2 g via INTRAVENOUS
  Filled 2016-11-24: qty 100

## 2016-11-24 MED ORDER — SENNA 8.6 MG PO TABS
1.0000 | ORAL_TABLET | Freq: Two times a day (BID) | ORAL | Status: DC
  Administered 2016-11-25 (×2): 8.6 mg via ORAL
  Filled 2016-11-24 (×2): qty 1

## 2016-11-24 MED ORDER — FENTANYL CITRATE (PF) 250 MCG/5ML IJ SOLN
INTRAMUSCULAR | Status: DC | PRN
  Administered 2016-11-24: 50 ug via INTRAVENOUS
  Administered 2016-11-24 (×4): 25 ug via INTRAVENOUS
  Administered 2016-11-24 (×2): 50 ug via INTRAVENOUS

## 2016-11-24 MED ORDER — MIDAZOLAM HCL 2 MG/2ML IJ SOLN
INTRAMUSCULAR | Status: AC
  Filled 2016-11-24: qty 2

## 2016-11-24 MED ORDER — FAMOTIDINE 20 MG PO TABS: 20.0000 mg | ORAL_TABLET | Freq: Two times a day (BID) | ORAL | Status: DC | PRN

## 2016-11-24 MED ORDER — ONDANSETRON HCL 4 MG PO TABS: 4.0000 mg | ORAL_TABLET | Freq: Four times a day (QID) | ORAL | Status: DC | PRN

## 2016-11-24 MED ORDER — FENTANYL CITRATE (PF) 100 MCG/2ML IJ SOLN
50.0000 ug | Freq: Once | INTRAMUSCULAR | Status: AC
  Administered 2016-11-24: 50 ug via INTRAVENOUS

## 2016-11-24 MED ORDER — NITROGLYCERIN 0.4 MG SL SUBL: 0.4000 mg | SUBLINGUAL_TABLET | SUBLINGUAL | Status: DC | PRN

## 2016-11-24 MED ORDER — EZETIMIBE 10 MG PO TABS
10.0000 mg | ORAL_TABLET | Freq: Every day | ORAL | Status: DC
  Administered 2016-11-25: 10 mg via ORAL
  Filled 2016-11-24: qty 1

## 2016-11-24 MED ORDER — FENTANYL CITRATE (PF) 100 MCG/2ML IJ SOLN
INTRAMUSCULAR | Status: AC
  Filled 2016-11-24: qty 2

## 2016-11-24 MED ORDER — CEFAZOLIN SODIUM-DEXTROSE 1-4 GM/50ML-% IV SOLN
1.0000 g | Freq: Three times a day (TID) | INTRAVENOUS | Status: DC
  Administered 2016-11-25: 1 g via INTRAVENOUS
  Filled 2016-11-24 (×2): qty 50

## 2016-11-24 SURGICAL SUPPLY — 46 items
BANDAGE ACE 4X5 VEL STRL LF (GAUZE/BANDAGES/DRESSINGS) IMPLANT
BNDG COHESIVE 1X5 TAN STRL LF (GAUZE/BANDAGES/DRESSINGS) ×4 IMPLANT
BNDG CONFORM 2 STRL LF (GAUZE/BANDAGES/DRESSINGS) ×4 IMPLANT
BNDG ELASTIC 2X5.8 VLCR STR LF (GAUZE/BANDAGES/DRESSINGS) ×2 IMPLANT
BNDG GAUZE ELAST 4 BULKY (GAUZE/BANDAGES/DRESSINGS) ×2 IMPLANT
CORDS BIPOLAR (ELECTRODE) ×2 IMPLANT
COVER SURGICAL LIGHT HANDLE (MISCELLANEOUS) ×2 IMPLANT
CUFF TOURNIQUET SINGLE 18IN (TOURNIQUET CUFF) ×2 IMPLANT
CUFF TOURNIQUET SINGLE 24IN (TOURNIQUET CUFF) IMPLANT
DRSG ADAPTIC 3X8 NADH LF (GAUZE/BANDAGES/DRESSINGS) ×2 IMPLANT
GAUZE SPONGE 4X4 12PLY STRL (GAUZE/BANDAGES/DRESSINGS) ×4 IMPLANT
GAUZE XEROFORM 1X8 LF (GAUZE/BANDAGES/DRESSINGS) IMPLANT
GAUZE XEROFORM 5X9 LF (GAUZE/BANDAGES/DRESSINGS) ×2 IMPLANT
GLOVE BIOGEL M 8.0 STRL (GLOVE) ×2 IMPLANT
GLOVE SS BIOGEL STRL SZ 8 (GLOVE) ×1 IMPLANT
GLOVE SUPERSENSE BIOGEL SZ 8 (GLOVE) ×1
GOWN STRL REUS W/ TWL LRG LVL3 (GOWN DISPOSABLE) ×1 IMPLANT
GOWN STRL REUS W/ TWL XL LVL3 (GOWN DISPOSABLE) ×2 IMPLANT
GOWN STRL REUS W/TWL LRG LVL3 (GOWN DISPOSABLE) ×1
GOWN STRL REUS W/TWL XL LVL3 (GOWN DISPOSABLE) ×2
K-WIRE 0.9X102 (Wire) ×4 IMPLANT
KIT BASIN OR (CUSTOM PROCEDURE TRAY) ×2 IMPLANT
KIT ROOM TURNOVER OR (KITS) ×2 IMPLANT
KWIRE 0.9X102 (Wire) ×2 IMPLANT
MANIFOLD NEPTUNE II (INSTRUMENTS) ×2 IMPLANT
NEEDLE HYPO 25GX1X1/2 BEV (NEEDLE) ×2 IMPLANT
NS IRRIG 1000ML POUR BTL (IV SOLUTION) ×2 IMPLANT
PACK ORTHO EXTREMITY (CUSTOM PROCEDURE TRAY) ×2 IMPLANT
PAD ARMBOARD 7.5X6 YLW CONV (MISCELLANEOUS) ×2 IMPLANT
PAD CAST 4YDX4 CTTN HI CHSV (CAST SUPPLIES) ×1 IMPLANT
PADDING CAST COTTON 4X4 STRL (CAST SUPPLIES) ×1
SCRUB BETADINE 4OZ XXX (MISCELLANEOUS) ×2 IMPLANT
SET CYSTO W/LG BORE CLAMP LF (SET/KITS/TRAYS/PACK) ×2 IMPLANT
SOL PREP POV-IOD 4OZ 10% (MISCELLANEOUS) ×2 IMPLANT
SPLINT FIBERGLASS 3X35 (CAST SUPPLIES) ×2 IMPLANT
SPLINT FINGER W/BULB (SOFTGOODS) ×6 IMPLANT
SPONGE LAP 4X18 X RAY DECT (DISPOSABLE) ×2 IMPLANT
SUT CHROMIC 4 0 PS 2 18 (SUTURE) ×4 IMPLANT
SUT CHROMIC 5 0 P 3 (SUTURE) ×10 IMPLANT
SWAB CULTURE ESWAB REG 1ML (MISCELLANEOUS) IMPLANT
SYR CONTROL 10ML LL (SYRINGE) ×2 IMPLANT
TOWEL OR 17X24 6PK STRL BLUE (TOWEL DISPOSABLE) ×2 IMPLANT
TOWEL OR 17X26 10 PK STRL BLUE (TOWEL DISPOSABLE) ×2 IMPLANT
TUBE CONNECTING 12X1/4 (SUCTIONS) ×2 IMPLANT
WATER STERILE IRR 1000ML POUR (IV SOLUTION) IMPLANT
YANKAUER SUCT BULB TIP NO VENT (SUCTIONS) ×2 IMPLANT

## 2016-11-24 NOTE — Transfer of Care (Signed)
Immediate Anesthesia Transfer of Care Note  Patient: [name]  Procedure(s) Performed: IRRIGATION AND DEBRIDEMENT LEFT HAND, THUMB, INDEX, MIDDLE, RING, AND SMALL FINGERS WITH RECONSTRUCTION (Left )  Patient Location: PACU  Anesthesia Type:MAC combined with regional for post-op pain  Level of Consciousness: awake, alert  and oriented  Airway & Oxygen Therapy: Patient Spontanous Breathing  Post-op Assessment: Report given to RN and Post -op Vital signs reviewed and stable  Post vital signs: Reviewed and stable  Last Vitals:  Vitals:   11/24/16 1652 11/24/16 1831  BP: 134/81 (!) 164/77  Pulse: 65 64  Resp: 17 19  Temp: 36.8 C   SpO2:  98%    Last Pain:  Vitals:   11/24/16 1828  TempSrc:   PainSc: 5          Complications: No apparent anesthesia complications

## 2016-11-24 NOTE — ED Notes (Signed)
Pt returned from xray

## 2016-11-24 NOTE — Telephone Encounter (Signed)
Patient presented to clinic with a traumatic injury to his left hand. He was using a saw (table saw?) approximately 27mins prior to arrival and has lacerations to all 4 fingers and the thumb. The most significant injury appears to be to the middle finger, there appeared to be exposed tendon or possible bone. Pt was advised that this was beyond our capabilities in our office. Discussed case with Dorothyann Peng, NP and he examined the injury. He advised pt seek care immediately at [facility] ED. Called ED Triage Nurse to advise pt is on his way there. Pt advised to go straight to ED for evaluation. Wound was pressure wrapped with non-adherent pads and coflex. Pt advised to keep hand above heart level while en route. Pt voiced understanding to all recommendations and agrees to go to ED immediately.   Dr. Raliegh Ip - Jul[facility] Rainier. Thanks!

## 2016-11-24 NOTE — Progress Notes (Signed)
Orthopedic Tech Progress Note Patient Details:  ACEA YAGI 1940-10-24 [id number]  Ortho Devices Type of Ortho Device: Arm sling Ortho Device/Splint Location: kuzman sling Ortho Device/Splint Interventions: Ordered, Application   Braulio Bosch 11/24/2016, 10:36 PM

## 2016-11-24 NOTE — Op Note (Signed)
See full dictation [id number]  Status post skill saw injury requiring #1 irrigation debridement left thumb skin subQ tissue and open fracture #2 open treatment distal phalanx fracture thumb #3 reconstruction volar soft tissue left thumb & reconstruction and exploration of the penetrating wound and soft tissue left thumb  #4 irrigation and debridement skin subQ tissue and bone secondary to open fracture left index finger #5 nailbed repair left index finger #6 nailbed grafting left index finger #7 ORIF middle phalanx and distal phalanx fracture left index finger #8 complex soft tissue reconstruction with dorsal flap left index finger  #9 irrigation and debridement skin subcutaneous tissue and bone second at open fracture left middle finger #10 open reduction internal fixation distal phalanx and middle phalanx left index finger- these were 2 separate fractures #11 zone 1 flexor tendon repair with FiberWire left middle finger #12 nailbed repair complex left middle finger #13 complex volar flap reconstruction left middle finger  #14 irrigation and debridement skin subcutaneous tissue with wound closure and exploration of the penetrating wound left small finger  Patient be admitted for IV antibiotics. He'll be discharged tomorrow we'll watch him closely.  We'll plan to take down his dressings in 2 weeks  Maricarmen Braziel M.D.

## 2016-11-24 NOTE — ED Notes (Signed)
ED Provider at bedside. 

## 2016-11-24 NOTE — ED Notes (Signed)
Patient transported to X-ray 

## 2016-11-24 NOTE — ED Notes (Signed)
MD examined and rewrapped pt's hand.

## 2016-11-24 NOTE — ED Triage Notes (Signed)
Pt arrives to ED after using table saw and cutting all five finger on left hand. Bleeding controlled with pressure dressing at this time.

## 2016-11-24 NOTE — ED Provider Notes (Signed)
[facility] Provider Note   CSN: [id number] Arrival date & time: 11/24/16  1623     History   Chief Complaint Chief Complaint  Patient presents with  . Hand Injury    HPI [name] is a 76 y.o. male.  HPI   46yM with multiple lacerations to fingers L hand. Injured while using tablesaw just prior to arrival. Denies any other injuries. He is R handed.   Past Medical History:  Diagnosis Date  . Allergy   . Anemia   . B12 deficiency anemia   . Blood transfusion without reported diagnosis   . CAD (coronary artery disease)   . Cancer ([place])    bladder cancer 8 times  . Colon polyps   . COPD (chronic obstructive pulmonary disease) ([place])   . Depression   . Esophagitis   . Esophagus, Barrett's   . GERD (gastroesophageal reflux disease)   . History of bladder cancer   . Hyperlipidemia   . Hypertension   . Hypothyroidism   . Localized osteoarthrosis, lower leg   . Low back pain   . Malaise and fatigue   . Stenosis of esophagus     Patient Active Problem List   Diagnosis Date Noted  . B12 deficiency 05/28/2015  . GERD (gastroesophageal reflux disease) 11/16/2014  . Hyperlipidemia LDL goal <70 11/21/2013  . Exertional shortness of breath 11/21/2013  . Other dysphagia 07/14/2013  . History of esophageal stricture 07/14/2013  . CAD (coronary artery disease) 06/05/2013  . Leg pain, bilateral 10/18/2012  . Restless leg syndrome 06/21/2012  . Cardiomyopathy, ischemic 06/21/2012  . DYSPNEA ON EXERTION 10/22/2009  . ULNAR NEUROPATHY, LEFT 08/05/2009  . OTHER SPECIFIED ANEMIAS 10/19/2008  . Urinary obstruction 10/19/2008  . ACTINIC KERATOSIS 06/25/2008  . DRY EYE SYNDROME 10/13/2007  . COPD 10/13/2007  . CARCINOMA, BLADDER, HX OF 07/02/2007  . STRICTURE AND STENOSIS OF ESOPHAGUS 05/23/2007  . BARRETTS ESOPHAGUS 05/23/2007  . Hypothyroidism 04/08/2007  . Other malaise and fatigue 04/08/2007  . UNS ADVRS EFF UNS RX  MEDICINAL&BIOLOGICAL SBSTNC 04/08/2007  . Essential hypertension 10/12/2006  . ANEMIA, B12 DEFICIENCY 09/08/2006  . Hyperlipidemia 07/29/2006  . DEPRESSION 07/29/2006  . NEUROPATHY, IDIOPATHIC PERIPHERAL NEC 07/29/2006  . Allergic rhinitis 07/29/2006  . GERD, SEVERE 07/29/2006  . LOW BACK PAIN 07/29/2006  . Coronary atherosclerosis 07/14/2006  . COLONIC POLYPS 11/04/2000    Past Surgical History:  Procedure Laterality Date  . bladder cancer    . CERVICAL DISCECTOMY     ACDF  . COLONOSCOPY  11/17/2005   normal   . CORONARY ARTERY BYPASS GRAFT     x4  . ESOPHAGOGASTRODUODENOSCOPY  04/29/2010  . KNEE ARTHROSCOPY    . LUMBAR LAMINECTOMY     and fusion  . POPLITEAL SYNOVIAL CYST EXCISION         Home Medications    Prior to Admission medications   Medication Sig Start Date End Date Taking? Authorizing Provider  aspirin 81 MG tablet Take 81 mg by mouth daily.      [provider]  Cyanocobalamin (B-12 COMPLIANCE INJECTION IJ) Inject 500 mg as directed every 30 (thirty) days.    [provider]  diclofenac sodium (VOLTAREN) 1 % GEL Apply 2 g topically 4 (four) times daily. 12/11/15   Bayard Hugger, NP  etodolac (LODINE) 300 MG capsule TAKE ONE CAPSULE BY MOUTH EVERY 8 HOURS 08/17/16   Marletta Lor, MD  ezetimibe (ZETIA) 10 MG tablet TAKE  1 TABLET BY MOUTH DAILY 11/02/16   Marletta Lor, MD  folic acid (FOLVITE) 761 MCG tablet Take 400 mcg by mouth daily.      [provider]  gabapentin (NEURONTIN) 600 MG tablet Take 1 tablet (600 mg total) by mouth 3 (three) times daily. 11/13/15   Jamse Arn, MD  isosorbide mononitrate (IMDUR) 60 MG 24 hr tablet TAKE ONE TABLET BY MOUTH ONCE DAILY 02/10/16   Marletta Lor, MD  losartan (COZAAR) 100 MG tablet Take 1 tablet (100 mg total) by mouth daily. 07/28/16 10/26/16  Troy Sine, MD  nebivolol (BYSTOLIC) 10 MG tablet Take 1 tablet (10 mg total) by mouth daily. 12/26/15   Troy Sine, MD  nitroGLYCERIN (NITROSTAT) 0.4 MG SL tablet Place 1 tablet (0.4 mg total) under the tongue every 5 (five) minutes as needed for chest pain. 10/26/16   Marletta Lor, MD  omeprazole (PRILOSEC) 10 MG capsule Take 10 mg by mouth daily.    [provider]  oxyCODONE-acetaminophen (PERCOCET) 10-325 MG tablet Take 1 tablet by mouth 2 (two) times daily as needed. 10/26/16   Marletta Lor, MD  rOPINIRole (REQUIP) 2 MG tablet TAKE 1 TABLET BY MOUTH AT BEDTIME 10/16/16   Marletta Lor, MD  rosuvastatin (CRESTOR) 20 MG tablet Take 1 tablet (20 mg total) by mouth daily. 07/28/16 10/26/16  Troy Sine, MD  venlafaxine (EFFEXOR) 75 MG tablet TAKE ONE TABLET BY MOUTH ONCE DAILY. 11/25/15   Marletta Lor, MD    Family History Family History  Problem Relation Age of Onset  . Cancer Mother        melanoma  . Stroke Father   . Hypertension Father   . Coronary artery disease Unknown   . Colon cancer Neg Hx   . Esophageal cancer Neg Hx   . Stomach cancer Neg Hx   . Rectal cancer Neg Hx     Social History Social History   Tobacco Use  . Smoking status: Former Smoker    Last attempt to quit: 03/30/1976    Years since quitting: 40.6  . Smokeless tobacco: Never Used  . Tobacco comment: quit in 1978  Substance Use Topics  . Alcohol use: No    Alcohol/week: 0.0 oz  . Drug use: No     Allergies   Adhesive [tape]   Review of Systems Review of Systems  All systems reviewed and negative, other than as noted in HPI.   Physical Exam Updated Vital Signs BP 134/81 (BP Location: Right Arm)   Pulse 65   Temp 98.3 F (36.8 C) (Oral)   Resp 17   SpO2 98%   Physical Exam  Musculoskeletal:  Complex lacerations to distal L little, middle, index fingers and thumb. Deformity distal phalanx/DIP of middle finger with visible flexor tendon injury. Location of some lacerations concerning for possible digital nerve injury but he reports sensation intact to  light touch b/l in all finger tips. Minimal bleeding from all wounds expect middle finger, but this is non-pulsatile and controllable with pressure.       ED Treatments / Results  Labs (all labs ordered are listed, but only abnormal results are displayed) Labs Reviewed  CBC WITH DIFFERENTIAL/PLATELET - Abnormal; Notable for the following components:      Result Value   RBC 3.79 (*)    Hemoglobin 11.0 (*)    HCT 33.7 (*)    Platelets 137 (*)    All other components  within normal limits  BASIC METABOLIC PANEL - Abnormal; Notable for the following components:   Glucose, Bld 128 (*)    Creatinine, Ser 1.42 (*)    Calcium 8.6 (*)    GFR calc non Af Amer 47 (*)    GFR calc Af Amer 55 (*)    All other components within normal limits    EKG  EKG Interpretation None       Radiology No results found.  Procedures Procedures (including critical care time)  Medications Ordered in ED Medications  ceFAZolin (ANCEF) IVPB 2g/100 mL premix (not administered)  Tdap (BOOSTRIX) injection 0.5 mL (not administered)  morphine 4 MG/ML injection 6 mg (not administered)  0.9 %  sodium chloride infusion (not administered)     Initial Impression / Assessment and Plan / ED Course  I have reviewed the triage vital signs and the nursing notes.  Pertinent labs & imaging results that were available during my care of the patient were reviewed by me and considered in my medical decision making (see chart for details).     89yM with tablesaw injuries. Multiple complex lacerations to fingers. Clinically comminuted fracture of distal phalanx with flexor tendon laceration.  Wounds gently irrigated and loosely wrapped with saline soaked gauze and bulky dressing.  Update tetanus. Abx. Consult to hand surgery. Imaging. NPO. Pain meds. Basic labs/EKG.   5:08 PM Discussed with Dr Amedeo Plenty. He is about to start a complex case. Will evaluate pt when done or mid-level to assess in mean time. Pt still  comfortable. Updated on plan.   Final Clinical Impressions(s) / ED Diagnoses   Final diagnoses:  Open displaced fracture of phalanx of left index finger, unspecified phalanx, initial encounter  Open displaced fracture of phalanx of left middle finger, unspecified phalanx, initial encounter  Multiple lacerations    ED Discharge Orders    None       Virgel Manifold, MD 11/24/16 1759

## 2016-11-24 NOTE — ED Notes (Signed)
Pt transported to short stay with RN.

## 2016-11-24 NOTE — Anesthesia Procedure Notes (Signed)
Anesthesia Regional Block: Supraclavicular block   Pre-Anesthetic Checklist: ,, timeout performed, Correct Patient, Correct Site, Correct Laterality, Correct Procedure, Correct Position, site marked, Risks and benefits discussed,  Surgical consent,  Pre-op evaluation,  At surgeon's request and post-op pain management  Laterality: Left  Prep: chloraprep       Needles:  Injection technique: Single-shot  Needle Type: Echogenic Stimulator Needle     Needle Length: 9cm  Needle Gauge: 21     Additional Needles:   Procedures:, nerve stimulator,,,,,,,   Nerve Stimulator or Paresthesia:  Response: 0.4 mA,   Additional Responses:   Narrative:  Start time: 11/24/2016 7:20 PM End time: 11/24/2016 7:30 PM Injection made incrementally with aspirations every 5 mL.  Performed by: Personally  Anesthesiologist: Lillia Abed, MD  Additional Notes: Monitors applied. Patient sedated. Sterile prep and drape,hand hygiene and sterile gloves were used. Relevant anatomy identified.Needle position confirmed.Local anesthetic injected incrementally after negative aspiration. Local anesthetic spread visualized around nerve(s). Vascular puncture avoided. No complications. Image printed for medical record.The patient tolerated the procedure well.

## 2016-11-24 NOTE — H&P (Signed)
[name] is an 76 y.o. male.   Chief Complaint: Table saw injury left hand to include thumb, index, middle, ring and small fingers HPI: The patient is a very pleasant 76 year old gentleman who sustained a significant table saw injury to the left hand. He had significant soft tissue disarray and open injuries noted about the thumb index and middle finger as well as a lesser extent the small finger. He presented to the [facility] emergency room for evaluation. He is noted to have open fractures about the left thumb, index finger with extensor tendon involvement as well as significant comminution and an open fracture about the middle finger distal tip with significant soft tissue disarray. He is noted to have laceration to the left small finger as well. He was previously placed in wet-to-dry compress dressings. He denies significant pain at this juncture. We have reviewed the findings at length and have discussed with the patient the severity of his injury. We have discussed with him the need for surgical intervention. I noted does have a significant cardiac history to include a prior CABG revascularization surgery in 2001, this is followed by Dr. West Bali. The patient recently seen Dr. Claiborne Billings October 17,2018. And was stable overall. His most recent ejection fraction is 60-65%. He takes aspirin on a daily basis, however, states that he has not taken this since Saturday as he "ran out". Since his prior CABG he states he has had multiple surgeries to include multiple low back procedures and tolerated these without difficulties from an operative standpoint.  Past Medical History:  Diagnosis Date  . Allergy   . Anemia   . B12 deficiency anemia   . Blood transfusion without reported diagnosis   . CAD (coronary artery disease)   . Cancer ([place])    bladder cancer 8 times  . Colon polyps   . COPD (chronic obstructive pulmonary disease) ([place])   . Depression   . Esophagitis   . Esophagus, Barrett's    . GERD (gastroesophageal reflux disease)   . History of bladder cancer   . Hyperlipidemia   . Hypertension   . Hypothyroidism   . Localized osteoarthrosis, lower leg   . Low back pain   . Malaise and fatigue   . Stenosis of esophagus     Past Surgical History:  Procedure Laterality Date  . bladder cancer    . CERVICAL DISCECTOMY     ACDF  . COLONOSCOPY  11/17/2005   normal   . CORONARY ARTERY BYPASS GRAFT     x4  . ESOPHAGOGASTRODUODENOSCOPY  04/29/2010  . KNEE ARTHROSCOPY    . LUMBAR LAMINECTOMY     and fusion  . POPLITEAL SYNOVIAL CYST EXCISION      Family History  Problem Relation Age of Onset  . Cancer Mother        melanoma  . Stroke Father   . Hypertension Father   . Coronary artery disease Unknown   . Colon cancer Neg Hx   . Esophageal cancer Neg Hx   . Stomach cancer Neg Hx   . Rectal cancer Neg Hx    Social History:  reports that he quit smoking about 40 years ago. he has never used smokeless tobacco. He reports that he does not drink alcohol or use drugs.  Allergies:  Allergies  Allergen Reactions  . Adhesive [Tape]     Itching, reddened skin     (Not in a hospital admission)  Results for orders placed or performed during the  hospital encounter of 11/24/16 (from the past 48 hour(s))  CBC with Differential     Status: Abnormal   Collection Time: 11/24/16  4:45 PM  Result Value Ref Range   WBC 5.9 4.0 - 10.5 K/uL   RBC 3.79 (L) 4.22 - 5.81 MIL/uL   Hemoglobin 11.0 (L) 13.0 - 17.0 g/dL   HCT 33.7 (L) 39.0 - 52.0 %   MCV 88.9 78.0 - 100.0 fL   MCH 29.0 26.0 - 34.0 pg   MCHC 32.6 30.0 - 36.0 g/dL   RDW 14.1 11.5 - 15.5 %   Platelets 137 (L) 150 - 400 K/uL   Neutrophils Relative % 57 %   Neutro Abs 3.4 1.7 - 7.7 K/uL   Lymphocytes Relative 28 %   Lymphs Abs 1.6 0.7 - 4.0 K/uL   Monocytes Relative 8 %   Monocytes Absolute 0.5 0.1 - 1.0 K/uL   Eosinophils Relative 6 %   Eosinophils Absolute 0.3 0.0 - 0.7 K/uL   Basophils Relative 1 %    Basophils Absolute 0.0 0.0 - 0.1 K/uL  Basic metabolic panel     Status: Abnormal   Collection Time: 11/24/16  4:45 PM  Result Value Ref Range   Sodium 139 135 - 145 mmol/L   Potassium 4.1 3.5 - 5.1 mmol/L   Chloride 107 101 - 111 mmol/L   CO2 26 22 - 32 mmol/L   Glucose, Bld 128 (H) 65 - 99 mg/dL   BUN 18 6 - 20 mg/dL   Creatinine, Ser 1.42 (H) 0.61 - 1.24 mg/dL   Calcium 8.6 (L) 8.9 - 10.3 mg/dL   GFR calc non Af Amer 47 (L) >60 mL/min   GFR calc Af Amer 55 (L) >60 mL/min    Comment: (NOTE) The eGFR has been calculated using the CKD EPI equation. This calculation has not been validated in all clinical situations. eGFR's persistently <60 mL/min signify possible Chronic Kidney Disease.    Anion gap 6 5 - 15   Dg Hand Complete Left  Result Date: 11/24/2016 CLINICAL DATA:  Table saw injury today macerated Ing the second and third digits. EXAM: LEFT HAND - COMPLETE 3+ VIEW COMPARISON:  None. FINDINGS: Fine bony detail is limited by overlying gauze. There is an open, comminuted fracture of the left second middle phalangeal head with extension into the distal interphalangeal joint. Subcutaneous emphysema is seen along the dorsum of the index finger and there is flexion of the finger at the DIP joint likely representing injury to the extensor tendon with unopposed flexor tendon action on the finger. An open comminuted fracture of the left distal phalanx of the middle finger is noted with splint long axis and transverse components, dorsal angulation of the distal main fracture fragments and intra-articular extension into the DIP joint. There is a comminuted fracture involving the head and neck of the third middle phalanx with volar displacement of the middle phalangeal head and extension into the DIP joint. There is osteoarthritic joint space narrowing and subchondral cystic change at the first [facility] joint. Degenerative joint space narrowing is identified of the first, second and third MCP  articulations. Tiny marginal erosions are noted of the second, third and fifth PIP joints. IMPRESSION: 1. Acute, open, comminuted fractures of the left middle phalanx of the index finger as well as left distal and middle phalanges of the middle with intra-articular involvement as above described. 2. Osteoarthritis otherwise noted of the hand and wrist. Electronically Signed   By: Meredith Leeds.D.  On: 11/24/2016 18:21    Review of Systems  Constitutional: Negative.   HENT: Negative.   Eyes: Negative.   Cardiovascular:       See history of present illness  Skin: Negative.     Blood pressure (!) 164/77, pulse 64, temperature 98.3 F (36.8 C), temperature source Oral, resp. rate 19, height _0  (1.753 m), weight 83.9 kg (185 lb), SpO2 98 %. Physical Exam  The patient is alert and oriented in no acute distress. The patient complains of pain in the affected upper extremity.  The patient is noted to have a normal HEENT exam. Lung fields show equal chest expansion and no shortness of breath. Abdomen exam is nontender without distention. Lower extremity examination does not show any fracture dislocation or blood clot symptoms. Pelvis is stable and the neck and back are stable and nontender. Examination of the left hand shows that he has open injury to the left thumb with an oblique laceration about the ulnar aspect of the thumb, refill is intact, gross sensation is intact, FPL and EPL appear intact. Evaluation of the index finger shows he has significant deformity about the distal tip with open injury present and loss of extensor function by terminal extensor tendon. Evaluation of the middle finger shows he has significant soft tissue disarray with an open fracture and associated flexion deformity present. Refill does appear to be intact about the intact soft tissues. Small finger shows that he has a 5 cm laceration about the distal tip. His sensation is intact, refill is intact FDS FDP appear intact  terminal extensor tendon appears intact. Assessment/Plan Table saw injury to the left hand to include the thumb, index, middle and small finger with open fractures, tendon involvement and significant soft tissue disarray We are planning surgery for your upper extremity. The risk and benefits of surgery to include risk of bleeding, infection, anesthesia,  damage to normal structures and failure of the surgery to accomplish its intended goals of relieving symptoms and restoring function have been discussed in detail. With this in mind we plan to proceed. I have specifically discussed with the patient the pre-and postoperative regime and the dos and don'ts and risk and benefits in great detail. Risk and benefits of surgery also include risk of dystrophy(CRPS), chronic nerve pain, failure of the healing process to go onto completion and other inherent risks of surgery The relavent the pathophysiology of the disease/injury process, as well as the alternatives for treatment and postoperative course of action has been discussed in great detail with the patient who desires to proceed.  We will do everything in our power to help you (the patient) restore function to the upper extremity. It is a pleasure to see this patient today.  Ishan Sanroman L, PA-C 11/24/2016, 6:43 PM

## 2016-11-24 NOTE — Anesthesia Postprocedure Evaluation (Signed)
Anesthesia Post Note  Patient: [name]  Procedure(s) Performed: IRRIGATION AND DEBRIDEMENT LEFT HAND, THUMB, INDEX, MIDDLE, RING, AND SMALL FINGERS WITH RECONSTRUCTION (Left )     Patient location during evaluation: PACU Anesthesia Type: General Level of consciousness: awake and alert and patient cooperative Pain management: pain level controlled Vital Signs Assessment: post-procedure vital signs reviewed and stable Respiratory status: spontaneous breathing and respiratory function stable Cardiovascular status: stable Anesthetic complications: no    Last Vitals:  Vitals:   11/24/16 2245 11/24/16 2246  BP:  (!) 162/80  Pulse: 70 70  Resp: 18 15  Temp: 36.6 C   SpO2: 94% 92%    Last Pain:  Vitals:   11/24/16 2245  TempSrc:   PainSc: 0-No pain                 Yudit Modesitt DAVID

## 2016-11-24 NOTE — Anesthesia Preprocedure Evaluation (Addendum)
Anesthesia Evaluation  Patient identified by MRN, date of birth, ID band Patient awake    Reviewed: Allergy & Precautions, NPO status , Patient's Chart, lab work & pertinent test results  Airway Mallampati: I  TM Distance: >3 FB Neck ROM: Full    Dental  (+) Dental Advisory Given   Pulmonary COPD, former smoker,    Pulmonary exam normal        Cardiovascular hypertension, Pt. on medications + CAD and + CABG  Normal cardiovascular exam     Neuro/Psych Depression    GI/Hepatic GERD  Medicated and Controlled,  Endo/Other    Renal/GU      Musculoskeletal   Abdominal   Peds  Hematology   Anesthesia Other Findings   Reproductive/Obstetrics                            Anesthesia Physical Anesthesia Plan  ASA: III  Anesthesia Plan: Regional and MAC   Post-op Pain Management:    Induction: Intravenous  PONV Risk Score and Plan: 2 and Dexamethasone, Ondansetron and Treatment may vary due to age or medical condition  Airway Management Planned: Simple Face Mask  Additional Equipment:   Intra-op Plan:   Post-operative Plan:   Informed Consent: I have reviewed the patients History and Physical, chart, labs and discussed the procedure including the risks, benefits and alternatives for the proposed anesthesia with the patient or authorized representative who has indicated his/her understanding and acceptance.     Plan Discussed with: CRNA and Surgeon  Anesthesia Plan Comments:       Anesthesia Quick Evaluation

## 2016-11-24 NOTE — OR Nursing (Signed)
Informed Consent for Irrigation and Debridement of LEFT Hand was written incorrectly. Consent read RIGHT hand. Consent was signed by patient, Short Stay RN, PA, and Anesthesiologist. It was not caught until the patient was slightly sedated and timeout was performed in the Operating Room. Team was in agreement that LEFT hand injury was obvious and to continue with LEFT hand surgery. Safety Zone Portal was completed.

## 2016-11-25 ENCOUNTER — Encounter (HOSPITAL_COMMUNITY): Payer: Self-pay | Admitting: Orthopedic Surgery

## 2016-11-25 MED ORDER — SULFAMETHOXAZOLE-TRIMETHOPRIM 800-160 MG PO TABS: 1.0000 | ORAL_TABLET | Freq: Two times a day (BID) | ORAL | 0 refills | Status: AC

## 2016-11-25 MED ORDER — CEPHALEXIN 500 MG PO CAPS: 500.0000 mg | ORAL_CAPSULE | Freq: Four times a day (QID) | ORAL | 0 refills | Status: AC

## 2016-11-25 MED ORDER — SULFAMETHOXAZOLE-TRIMETHOPRIM 800-160 MG PO TABS
1.0000 | ORAL_TABLET | Freq: Two times a day (BID) | ORAL | Status: DC
  Administered 2016-11-25: 1 via ORAL
  Filled 2016-11-25: qty 1

## 2016-11-25 MED ORDER — OXYCODONE HCL 5 MG PO TABS: 5.0000 mg | ORAL_TABLET | ORAL | 0 refills | Status: AC | PRN

## 2016-11-25 NOTE — Progress Notes (Signed)
Orthopedic Tech Progress Note Patient Details:  [name] [birth year] [id number]  Ortho Devices Type of Ortho Device: Arm sling Ortho Device/Splint Location: lue Ortho Device/Splint Interventions: Loanne Drilling, Mekiyah Gladwell 11/25/2016, 2:03 PM As ordered by PA Aaron Edelman

## 2016-11-25 NOTE — Discharge Summary (Signed)
Physician Discharge Summary  Patient ID: [name] MRN: [id number] DOB/AGE: [id number] y.o.  Admit date: 11/24/2016 Discharge date: 11/25/2016  Admission Diagnoses: table saw injury left hand to include thumb, index finger, middle finger and small finger Open fractures to the left index finger distal tip as well as middle finger distal tip Past Medical History:  Diagnosis Date  . Allergy   . Anemia   . B12 deficiency anemia   . Blood transfusion without reported diagnosis   . CAD (coronary artery disease)   . Cancer ([place])    bladder cancer 8 times  . Colon polyps   . COPD (chronic obstructive pulmonary disease) ([place])   . Depression   . Esophagitis   . Esophagus, Barrett's   . GERD (gastroesophageal reflux disease)   . History of bladder cancer   . Hyperlipidemia   . Hypertension   . Hypothyroidism   . Localized osteoarthrosis, lower leg   . Low back pain   . Malaise and fatigue   . Stenosis of esophagus     Discharge Diagnoses:  Active Problems:   Open fractures of multiple sites of phalanx of left hand   Surgeries: Procedure(s): IRRIGATION AND DEBRIDEMENT LEFT HAND, THUMB, INDEX, MIDDLE, RING, AND SMALL FINGERS WITH RECONSTRUCTION on 11/24/2016  please see operative report Consultants: none  Discharged Condition: Improved  Hospital Course: [name] is an 76 y.o. male who was admitted 11/24/2016 with a chief complaint of  Chief Complaint  Patient presents with  . Hand Injury  , and found to have a diagnosis of Chainsaw injury.  They were brought to the operating room on 11/24/2016 and underwent Procedure(s): IRRIGATION AND DEBRIDEMENT LEFT HAND, THUMB, INDEX, MIDDLE, RING, AND SMALL FINGERS WITH RECONSTRUCTION.   The patient was noted to have significant injuries with open fractures and significant soft tissue disarray about the index and middle finger of the left hand. He underwent copious irrigation, debridement, repair and revision with pinning  of the open fractures of the index and middle finger. He tolerated the procedure well there was no complications present. The thumb and small finger and went I and D and were repaired without difficulties. Patient was admitted for close observation, IV antibiotics and pain management. Postoperative day #1 the patient was awake, alert and oriented. He was very conversant and had no difficulties. His pain was controlled at that juncture. He was tolerating a regular diet. Physical examination of the upper extremity showed that the left hand splint was clean, dry and intact. No signs of ascending cellulitis or infection present. We discussed with the patient all discharge issues and will discharge him home in stable condition. He will need to follow-up with [place] in approximate 2 weeks for a wound check and therapy immediately following for custom-made splints for the fingers. He will be given Bactrim as well as Keflex for antibiotic purposes given the severity of his open fractures. They were given perioperative antibiotics:  Anti-infectives (From admission, onward)   Start     Dose/Rate Route Frequency Ordered Stop   11/25/16 1000  sulfamethoxazole-trimethoprim (BACTRIM DS,SEPTRA DS) 800-160 MG per tablet 1 tablet     1 tablet Oral Every 12 hours 11/25/16 0951     11/25/16 0600  ceFAZolin (ANCEF) IVPB 1 g/50 mL premix     1 g 100 mL/hr over 30 Minutes Intravenous Every 8 hours 11/24/16 2337     11/25/16 0000  cephALEXin (KEFLEX) 500 MG capsule     500 mg Oral  4 times daily 11/25/16 0953 12/03/16 2359   11/25/16 0000  sulfamethoxazole-trimethoprim (BACTRIM DS,SEPTRA DS) 800-160 MG tablet     1 tablet Oral 2 times daily 11/25/16 0953 12/09/16 2359   11/24/16 2345  ceFAZolin (ANCEF) IVPB 1 g/50 mL premix     1 g 100 mL/hr over 30 Minutes Intravenous NOW 11/24/16 2337 11/25/16 0133   11/24/16 1700  ceFAZolin (ANCEF) IVPB 2g/100 mL premix     2 g 200 mL/hr over 30 Minutes Intravenous  Once 11/24/16 1653  11/24/16 1721    .  They were given sequential compression devices, early ambulation, and aspirin for DVT prophylaxis.  Recent vital signs:  Patient Vitals for the past 24 hrs:  BP Temp Temp src Pulse Resp SpO2 Height Weight  11/25/16 0448 (!) 155/76 98.3 F (36.8 C) Oral 72 18 99 % - -  11/24/16 2246 (!) 162/80 - - 70 15 92 % - -  11/24/16 2245 - 97.9 F (36.6 C) - 70 18 94 % - -  11/24/16 2232 103/68 97.7 F (36.5 C) - 70 20 94 % - -  11/24/16 2230 - - - 67 (!) 21 96 % - -  11/24/16 2222 (!) 158/85 97.7 F (36.5 C) - 61 11 99 % - -  11/24/16 2220 (!) 194/162 - - 61 17 93 % - -  11/24/16 2216 (!) 150/95 - - 71 17 98 % - -  11/24/16 2215 - - - 69 18 92 % - -  11/24/16 2202 138/83 - - 67 16 (!) 88 % - -  11/24/16 2200 - - - 68 19 94 % - -  11/24/16 2146 (!) 150/70 97.7 F (36.5 C) - 69 17 91 % - -  11/24/16 2145 - 97.7 F (36.5 C) - - 15 91 % - -  11/24/16 1831 (!) 164/77 - - 64 19 98 % - -  11/24/16 1657 - - - - - - 5\' 9"  (1.753 m) 83.9 kg (185 lb)  11/24/16 1652 134/81 98.3 F (36.8 C) Oral 65 17 - - -  11/24/16 1632 (!) 159/68 98.8 F (37.1 C) Oral 73 17 98 % - -  .  Recent laboratory studies: Dg Hand Complete Left  Result Date: 11/24/2016 CLINICAL DATA:  Table saw injury today macerated Ing the second and third digits. EXAM: LEFT HAND - COMPLETE 3+ VIEW COMPARISON:  None. FINDINGS: Fine bony detail is limited by overlying gauze. There is an open, comminuted fracture of the left second middle phalangeal head with extension into the distal interphalangeal joint. Subcutaneous emphysema is seen along the dorsum of the index finger and there is flexion of the finger at the DIP joint likely representing injury to the extensor tendon with unopposed flexor tendon action on the finger. An open comminuted fracture of the left distal phalanx of the middle finger is noted with splint long axis and transverse components, dorsal angulation of the distal main fracture fragments and  intra-articular extension into the DIP joint. There is a comminuted fracture involving the head and neck of the third middle phalanx with volar displacement of the middle phalangeal head and extension into the DIP joint. There is osteoarthritic joint space narrowing and subchondral cystic change at the first [facility] joint. Degenerative joint space narrowing is identified of the first, second and third MCP articulations. Tiny marginal erosions are noted of the second, third and fifth PIP joints. IMPRESSION: 1. Acute, open, comminuted fractures of the left middle phalanx of the  index finger as well as left distal and middle phalanges of the middle with intra-articular involvement as above described. 2. Osteoarthritis otherwise noted of the hand and wrist. Electronically Signed   By: Ashley Royalty M.D.   On: 11/24/2016 18:21    Discharge Medications:   Allergies as of 11/25/2016      Reactions   Adhesive [tape]    Itching, reddened skin      Medication List    STOP taking these medications   losartan 100 MG tablet Commonly known as:  COZAAR     TAKE these medications   aspirin 81 MG tablet Take 81 mg by mouth daily.   B-12 COMPLIANCE INJECTION IJ Inject 500 mg as directed every 30 (thirty) days.   cephALEXin 500 MG capsule Commonly known as:  KEFLEX Take 1 capsule (500 mg total) 4 (four) times daily for 8 days by mouth.   diclofenac sodium 1 % Gel Commonly known as:  VOLTAREN Apply 2 g topically 4 (four) times daily.   etodolac 300 MG capsule Commonly known as:  LODINE TAKE ONE CAPSULE BY MOUTH EVERY 8 HOURS   ezetimibe 10 MG tablet Commonly known as:  ZETIA TAKE 1 TABLET BY MOUTH DAILY   folic acid 948 MCG tablet Commonly known as:  FOLVITE Take 400 mcg by mouth daily.   gabapentin 600 MG tablet Commonly known as:  NEURONTIN Take 1 tablet (600 mg total) by mouth 3 (three) times daily.   isosorbide mononitrate 60 MG 24 hr tablet Commonly known as:  IMDUR TAKE ONE TABLET BY  MOUTH ONCE DAILY   nebivolol 10 MG tablet Commonly known as:  BYSTOLIC Take 1 tablet (10 mg total) by mouth daily.   nitroGLYCERIN 0.4 MG SL tablet Commonly known as:  NITROSTAT Place 1 tablet (0.4 mg total) under the tongue every 5 (five) minutes as needed for chest pain.   omeprazole 10 MG capsule Commonly known as:  PRILOSEC Take 10 mg by mouth daily.   oxyCODONE 5 MG immediate release tablet Commonly known as:  Oxy IR/ROXICODONE Take 1 tablet (5 mg total) every 4 (four) hours as needed for up to 7 days by mouth for moderate pain or breakthrough pain.   oxyCODONE-acetaminophen 10-325 MG tablet Commonly known as:  PERCOCET Take 1 tablet by mouth 2 (two) times daily as needed.   rOPINIRole 2 MG tablet Commonly known as:  REQUIP TAKE 1 TABLET BY MOUTH AT BEDTIME   rosuvastatin 20 MG tablet Commonly known as:  CRESTOR Take 1 tablet (20 mg total) by mouth daily.   sulfamethoxazole-trimethoprim 800-160 MG tablet Commonly known as:  BACTRIM DS,SEPTRA DS Take 1 tablet 2 (two) times daily for 14 days by mouth.   venlafaxine 75 MG tablet Commonly known as:  EFFEXOR TAKE ONE TABLET BY MOUTH ONCE DAILY.       Diagnostic Studies: Dg Hand Complete Left  Result Date: 11/24/2016 CLINICAL DATA:  Table saw injury today macerated Ing the second and third digits. EXAM: LEFT HAND - COMPLETE 3+ VIEW COMPARISON:  None. FINDINGS: Fine bony detail is limited by overlying gauze. There is an open, comminuted fracture of the left second middle phalangeal head with extension into the distal interphalangeal joint. Subcutaneous emphysema is seen along the dorsum of the index finger and there is flexion of the finger at the DIP joint likely representing injury to the extensor tendon with unopposed flexor tendon action on the finger. An open comminuted fracture of the left distal phalanx of the middle  finger is noted with splint long axis and transverse components, dorsal angulation of the distal main  fracture fragments and intra-articular extension into the DIP joint. There is a comminuted fracture involving the head and neck of the third middle phalanx with volar displacement of the middle phalangeal head and extension into the DIP joint. There is osteoarthritic joint space narrowing and subchondral cystic change at the first [facility] joint. Degenerative joint space narrowing is identified of the first, second and third MCP articulations. Tiny marginal erosions are noted of the second, third and fifth PIP joints. IMPRESSION: 1. Acute, open, comminuted fractures of the left middle phalanx of the index finger as well as left distal and middle phalanges of the middle with intra-articular involvement as above described. 2. Osteoarthritis otherwise noted of the hand and wrist. Electronically Signed   By: Ashley Royalty M.D.   On: 11/24/2016 18:21    They benefited maximally from their hospital stay and there were no complications.     Disposition: 01-Home or Self Care Discharge Instructions    Call MD / Call 911   Complete by:  As directed    If you experience chest pain or shortness of breath, CALL 911 and be transported to the hospital emergency room.  If you develope a fever above 101 F, pus (white drainage) or increased drainage or redness at the wound, or calf pain, call your surgeon's office.   Constipation Prevention   Complete by:  As directed    Drink plenty of fluids.  Prune juice may be helpful.  You may use a stool softener, such as Colace (over the counter) 100 mg twice a day.  Use MiraLax (over the counter) for constipation as needed.   Diet - low sodium heart healthy   Complete by:  As directed    Discharge instructions   Complete by:  As directed    Keep bandage clean and dry.  Call for any problems.  No smoking.  Criteria for driving a car: you should be off your pain medicine for 7-8 hours, able to drive one handed(confident), thinking clearly and feeling able in your judgement to  drive. Continue elevation as it will decrease swelling.  If instructed by MD move your fingers within the confines of the bandage/splint.  Use ice if instructed by your MD. Call immediately for any sudden loss of feeling in your hand/arm or change in functional abilities of the extremity.We recommend that you to take vitamin C 1000 mg a day to promote healing. We also recommend that if you require  pain medicine that you take a stool softener to prevent constipation as most pain medicines will have constipation side effects. We recommend either Peri-Colace or Senokot and recommend that you also consider adding MiraLAX as well to prevent the constipation affects from pain medicine if you are required to use them. These medicines are over the counter and may be purchased at a local pharmacy. A cup of yogurt and a probiotic can also be helpful during the recovery process as the medicines can disrupt your intestinal environment.   Increase activity slowly as tolerated   Complete by:  As directed      Follow-up Information    Roseanne Kaufman, MD. Schedule an appointment as soon as possible for a visit in 2 week(s).   Specialty:  Orthopedic Surgery Contact information: [address] [place] [address] [phone]            Signed: Ivan Croft 11/25/2016, 9:56  AM

## 2016-11-25 NOTE — Discharge Instructions (Signed)
Keep bandage clean and dry.  Call for any problems.  No smoking.  Criteria for driving a car: you should be off your pain medicine for 7-8 hours, able to drive one handed(confident), thinking clearly and feeling able in your judgement to drive. °Continue elevation as it will decrease swelling.  If instructed by MD move your fingers within the confines of the bandage/splint.  Use ice if instructed by your MD. Call immediately for any sudden loss of feeling in your hand/arm or change in functional abilities of the extremity.We recommend that you to take vitamin C 1000 mg a day to promote healing. °We also recommend that if you require  pain medicine that you take a stool softener to prevent constipation as most pain medicines will have constipation side effects. We recommend either Peri-Colace or Senokot and recommend that you also consider adding MiraLAX as well to prevent the constipation affects from pain medicine if you are required to use them. These medicines are over the counter and may be purchased at a local pharmacy. A cup of yogurt and a probiotic can also be helpful during the recovery process as the medicines can disrupt your intestinal environment. ° °

## 2016-11-25 NOTE — Op Note (Signed)
NAME:  [name], [name]                  ACCOUNT NO.:  MEDICAL RECORD NO.:  [id number]  LOCATION:                                 FACILITY:  PHYSICIAN:  Satira Anis. Amedeo Plenty, M.D.     DATE OF BIRTH:  DATE OF PROCEDURE: DATE OF DISCHARGE:                              OPERATIVE REPORT   PREOPERATIVE DIAGNOSES:  Skill saw injury to the left hand involving the thumb, index, middle, and small finger.  He has mutilating open wounds about the thumb, middle and index finger, small finger soft tissue injury only.  He has comminuted distal and middle phalanx fractures of the index and middle finger and a distal phalanx fracture of the thumb.  The patient was counseled in regard to risks and benefits of surgery. He was extensively evaluated.  The patient is going to be urgently treated for the table saw injury and understands the risks and benefits remained to the upper extremity predicament.  I have marked the correct left upper extremity.  The patient understands our plans and concerns and we will move forward in an urgent fashion.  SURGICAL PROCEDURES PERFORMED: 1. Irrigation and debridement, left thumb skin, subcutaneous tissue,     bone and associated soft tissue secondary to open fracture.  This     was an excisional debridement with curette, knife, and scissor, 2.5-     3 cm. 2. Open treatment of distal phalanx fracture, left thumb. 3. Exploration of penetrating wound, left thumb and complex volar soft     tissue reconstruction. 4. Irrigation and debridement of open fracture, skin, subcutaneous     tissue, bone, and associated soft tissues about the left index     finger.  This was an excisional debridement with curette, knife,     blade, and scissor. 5. Complex nail bed repair, left index finger. 6. Nail bed grafting, left index finger, complex in nature. 7. Open reduction and internal fixation, middle phalanx fracture, left     index finger. 8. Open reduction and internal  fixation, distal phalanx fracture, left     index finger. 9. Complex soft tissue reconstruction with dorsal flap, left index     finger. 10.Irrigation and debridement of skin, subcutaneous tissue, bone, and     associated soft tissues secondary to open fracture about the left     middle finger including the distal and middle phalanx.  This was an     excisional debridement with curette, knife, and scissor. 11.Open reduction and internal fixation of distal phalanx fracture,     left middle finger. 12.Open reduction and internal fixation of left index finger middle     phalanx fracture, complex in nature. 13.Left middle finger zone 1 flexor tendon repair with FiberWire     suture. 14.Complex nail bed repair, left middle finger. 15.Complex volar flap reconstruction, left middle finger. 16.Irrigation and debridement of skin and subcutaneous tissue with     wound closure and exploration of the penetrating wound, left small     finger.  SURGEON:  Satira Anis. Amedeo Plenty, MD.  ASSISTANT:  Avelina Laine, PA-C.  COMPLICATIONS:  None.  ANESTHESIA:  Block with IV sedation.  TOURNIQUET TIME:  Less than 2 hours.  INDICATIONS:  This is a 76 year old gentleman, who is completely pleasant and underwent a severe injury today.  He was taken emergently to the operative theater as noted above.  He was counseled in regard to risks and benefits surgery.  I examined his hand, which was bandaged and we went through the risks and benefit profile, do's and don'ts, need for amputation, possible risk of infection, other issues remained to the severe injury such as this.  We proceeded to the operative theater on an emergent basis.  OPERATIVE PROCEDURE:  The patient was seen by myself and Anesthesia, taken to the operative theater, underwent IV sedation and following this, underwent 2 separate Hibiclens scrubs by myself.  Following this, my physician assistant, Avelina Laine, performed a 10-minute  surgical Betadine scrub and paint.  Sterile field was secured.  Time-out was observed.  All questions were encouraged and answered, and at this time, we commenced with surgery on the left upper extremity.  I should note the patient had irrigation and debridement of skin, subcutaneous tissue, and bone about the thumb.  This was performed with curette, knife, and scissor.  I should note the patient then underwent irrigation and debridement of skin, subcutaneous tissue, and bone about the distal phalanx and middle phalanx as well as nail bed and nail plate tissue and tendinous tissue including the extensor apparatus, left index finger.  This was an excisional debridement with curette, knife, blade, and scissor.  Following this, middle finger was addressed with irrigation and debridement utilizing curette, knife, and scissor.  This was an excisional debridement and included bone about the distal and middle phalanx, which were both fractured as well as nail bed and the volar soft tissue structures, which were severely mangled and had exposed and injured flexor digitorum profundus tendon.  Following this, irrigation and debridement of the small finger occurred.  Once this was complete, we then placed 6 L of fluid through and through the wounds and were very aggressive about the irrigation and debridement process.  Following this, towels and drapes were changed and the patient underwent open treatment of distal phalanx fracture about the left thumb.  Following this, we performed exploration of the penetrating wound about the thumb and complex volar reconstruction about the left thumb.  Following this, we performed open reduction and internal fixation of the middle phalanx of the index finger and open reduction and internal fixation of the distal phalanx of the index finger with Kirschner wire, which was buried and kept away from any exposed outside surface.  I buried the pin distally in  the distal phalanx region and will take this out in the office if we can achieve wound healing in the future.  Thus, ORIF of middle phalanx and distal phalanx was accomplished, left index finger.  Following this, we performed complex look to the nail.  The nail plate had been avulsed.  There was soft tissue defect including the nail bed. I performed a nail bed repair with fine chromic suture under 4.0 loupe magnification.  This was done proximally in the germinal matrix.  He will have a high tendency towards deformed nail issues.  Following this, I then performed a turndown flap with the distal portion as a free nail bed flap and graft.  This was inset with fine chromic suture.  Once this was complete, we then irrigated copiously and I was quite pleased overall and satisfied.  We then performed complex dorsal soft tissue reconstruction.  The extensor  apparatus was completely obliterated and I did not choose to perform a free graft here as I would rather the patient just simply have some stiffness occur and hopefully some wound healing with some extensor lag into the future.  Once this was complete, the patient underwent open reduction and internal fixation of the middle phalanx fracture and open reduction and internal fixation of the distal phalanx fracture with Kirschner wires, which were buried beneath the skin and will be removed later once healing was secured.  This was very complex ORIF as there were splintered fragments throughout.  The patient had marked abnormality and looked quite poor.  I was very impressed by the amount of bony destruction and bone loss for that matter.  Nevertheless, we were able to achieve adequate and reasonable anatomic fixation.  Following this, I performed a zone 1 flexor tendon repair and a volar plate reconstruction as well.  I tied the volar plate into the tendon in hopes that we can have meaningful healing.  Following this, we removed the  nail plate and after the nail plate was removed, performed a complex nailbed repair about the left middle finger.  This was done with fine chromic suture under 4.0 loupe magnification.  Following this, we performed a complex volar soft tissue flap reconstruction with a chromic suture of half the middle finger.  Once this was complete, we then placed Adaptic under the eponychial folds and dressed the wound.  We then performed closure of the wounds about the small finger.  Portions of this would have to heal in by secondary intent healing.  Following this, complex dressing was accomplished.  Finger splints applied.  He would be admitted for IV antibiotics.  I did deflate the tourniquet in less than 2 hours, refill was excellent and I was pleased with this.  I give him a variable prognosis at this time.  He has a lot in front of him and very significant challenges ahead to try and have a functional extremity.  This is quite devastating injury.  We discussed these issues with the patient preoperatively, of course.  Once again, IV antibiotics and we will take his dressings down in 10-14 days to see how he is doing.  Fingertip protectors will be placed and we will go ahead and begin MCP range of motion in 3-4 weeks, consider some PIP range of motion under the guys of our therapist.  He tolerated the procedure well.  He had a block with IV sedation and there were no complicating features.  Do's and don'ts have been discussed and all questions have been encouraged and answered.     Satira Anis. Amedeo Plenty, M.D.     [facility]  D:  11/24/2016  T:  11/24/2016  Job:  [id number]

## 2016-11-25 NOTE — Evaluation (Addendum)
Occupational Therapy Evaluation Patient Details Name: [name] MRN: [id number] DOB: 11-02-1940 Today's Date: 11/25/2016    History of Present Illness Pt is a 76 y.o. male presenting following table saw injury to L hand. Now s/p I&D L hand, thumb, index, middle, ring, and small fingers with reconstruction. PMHx: CAD, Cancer, COPD, Depression, GERD, Hyperlipidemia, HTN, Hypothyroidism, Low back pain, ACDF, CABG, Lumbar laminectomy.   Clinical Impression   Pt reports he was independent with ADL PTA. Currently pt requires supervision for ADL and functional mobility. Educated pt on elevation, ice, and ROM for edema management. Pt with decreased gross motor coordination in LUE, ?nerve block. Pt planning to d/c home alone with intermittent supervision from family. Recommend outpatient OT (hand therapy) for follow up per MD clearance. No further acute OT needs identified; signing off at this time. Please re-consult if needs change. Thank you for this referral.    Follow Up Recommendations  Outpatient OT;Supervision - Intermittent(Outpatient hand therapy per MD clearance)    Equipment Recommendations  None recommended by OT    Recommendations for Other Services       Precautions / Restrictions Precautions Precautions: None Restrictions Weight Bearing Restrictions: No      Mobility Bed Mobility Overal bed mobility: Needs Assistance Bed Mobility: Supine to Sit     Supine to sit: Min assist     General bed mobility comments: Hand held assist to boost up into sitting  Transfers Overall transfer level: Needs assistance Equipment used: None Transfers: Sit to/from Stand Sit to Stand: Supervision         General transfer comment: for safety. No unsteadiness or LOB    Balance Overall balance assessment: No apparent balance deficits (not formally assessed)                                         ADL either performed or assessed with clinical judgement    ADL Overall ADL's : Needs assistance/impaired Eating/Feeding: Set up;Sitting   Grooming: Supervision/safety;Standing   Upper Body Bathing: Set up;Sitting   Lower Body Bathing: Supervison/ safety;Sit to/from stand   Upper Body Dressing : Set up;Sitting Upper Body Dressing Details (indicate cue type and reason): to don gown Lower Body Dressing: Supervision/safety;Sit to/from stand Lower Body Dressing Details (indicate cue type and reason): to adjust socks Toilet Transfer: Supervision/safety;Ambulation Toilet Transfer Details (indicate cue type and reason): Simulated by sit to stand from EOB with functional mobility         Functional mobility during ADLs: Supervision/safety General ADL Comments: Educated pt on elevation, ice, and LUE elbow/shoulder ROM for edema management; pt able to teach back at end of session.     Vision         Perception     Praxis      Pertinent Vitals/Pain Pain Assessment: 0-10 Pain Score: 5  Pain Location: back Pain Descriptors / Indicators: Aching;Sore Pain Intervention(s): Monitored during session;Repositioned     Hand Dominance Right   Extremity/Trunk Assessment Upper Extremity Assessment Upper Extremity Assessment: LUE deficits/detail LUE Deficits / Details: decreased gross motor coordination--?nerve block LUE Coordination: decreased gross motor   Lower Extremity Assessment Lower Extremity Assessment: Defer to PT evaluation   Cervical / Trunk Assessment Cervical / Trunk Assessment: Other exceptions Cervical / Trunk Exceptions: hx back sx and pain   Communication Communication Communication: No difficulties   Cognition Arousal/Alertness: Awake/alert Behavior During Therapy: WFL for  tasks assessed/performed Overall Cognitive Status: Within Functional Limits for tasks assessed                                     General Comments       Exercises     Shoulder Instructions      Home Living Family/patient  expects to be discharged to:: Private residence Living Arrangements: Alone Available Help at Discharge: Family;Available PRN/intermittently Type of Home: House       Home Layout: Multi-level;Able to live on main level with bedroom/bathroom     Bathroom Shower/Tub: Occupational psychologist: Standard     Home Equipment: Shower seat          Prior Functioning/Environment Level of Independence: Independent                 OT Problem List:        OT Treatment/Interventions:      OT Goals(Current goals can be found in the care plan section) Acute Rehab OT Goals Patient Stated Goal: return home OT Goal Formulation: All assessment and education complete, DC therapy  OT Frequency:     Barriers to D/C:            Co-evaluation              AM-PAC PT "6 Clicks" Daily Activity     Outcome Measure Help from another person eating meals?: None Help from another person taking care of personal grooming?: A Little Help from another person toileting, which includes using toliet, bedpan, or urinal?: A Little Help from another person bathing (including washing, rinsing, drying)?: A Little Help from another person to put on and taking off regular upper body clothing?: A Little Help from another person to put on and taking off regular lower body clothing?: A Little 6 Click Score: 19   End of Session Nurse Communication: Mobility status  Activity Tolerance: Patient tolerated treatment well Patient left: in chair;with call bell/phone within reach  OT Visit Diagnosis: Pain Pain - part of body: (back)                Time: 0539-7673 OT Time Calculation (min): 17 min Charges:  OT General Charges $OT Visit: 1 Visit OT Evaluation $OT Eval Moderate Complexity: 1 Mod G-Codes: OT G-codes **NOT FOR INPATIENT CLASS** Functional Assessment Tool Used: Clinical judgement Functional Limitation: Self care Self Care Current Status (A1937): At least 1 percent but less than  20 percent impaired, limited or restricted Self Care Goal Status (T0240): At least 1 percent but less than 20 percent impaired, limited or restricted Self Care Discharge Status [phone]): At least 1 percent but less than 20 percent impaired, limited or restricted   [name], M.S., OTR/L Pager: [place] 11/25/2016, 10:38 AM

## 2016-11-25 NOTE — Evaluation (Signed)
Physical Therapy Evaluation Patient Details Name: [name] MRN: [id number] DOB: 02-Jun-1940 Today's Date: 11/25/2016   History of Present Illness  Pt is a 76 y.o. male presenting following table saw injury to L hand. Now s/p I&D L hand, thumb, index, middle, ring, and small fingers with reconstruction. PMHx: CAD, Cancer, COPD, Depression, GERD, Hyperlipidemia, HTN, Hypothyroidism, Low back pain, ACDF, CABG, Lumbar laminectomy.  Clinical Impression  Patient is functioning at Mod I level for all mobility and gait.  Able to negotiate stairs with min guard assist.  No further PT needs identified - PT will sign off.  Ready for d/c from PT perspective.    Follow Up Recommendations No PT follow up;Supervision - Intermittent    Equipment Recommendations  None recommended by PT    Recommendations for Other Services       Precautions / Restrictions Precautions Precautions: None Restrictions Weight Bearing Restrictions: No Other Position/Activity Restrictions: elevate Lt hand      Mobility  Bed Mobility Overal bed mobility: Needs Assistance Bed Mobility: Supine to Sit     Supine to sit: Min assist     General bed mobility comments: In chair as PT entered room  Transfers Overall transfer level: Modified independent Equipment used: None Transfers: Sit to/from Stand Sit to Stand: Modified independent (Device/Increase time)         General transfer comment: No unsteadiness.  Increased time  Ambulation/Gait Ambulation/Gait assistance: Modified independent (Device/Increase time) Ambulation Distance (Feet): 110 Feet Assistive device: None Gait Pattern/deviations: Step-through pattern;Decreased stride length Gait velocity: decreased Gait velocity interpretation: Below normal speed for age/gender General Gait Details: Patient with slow, guarded gait.  No loss of balance during gait.  Stairs Stairs: Yes Stairs assistance: Min guard Stair Management: No rails;Step to  pattern;Forwards Number of Stairs: 3 General stair comments: Instructed patient on step-to pattern.  Instructed family member on guarding patient safely on stairs.  Wheelchair Mobility    Modified Rankin (Stroke Patients Only)       Balance Overall balance assessment: Modified Independent                                           Pertinent Vitals/Pain Pain Assessment: 0-10 Pain Score: 4  Pain Location: back Pain Descriptors / Indicators: Aching;Sore Pain Intervention(s): Monitored during session    Home Living Family/patient expects to be discharged to:: Private residence Living Arrangements: Alone Available Help at Discharge: Family;Available PRN/intermittently Type of Home: House Home Access: Stairs to enter Entrance Stairs-Rails: None Entrance Stairs-Number of Steps: 2 Home Layout: Multi-level;Able to live on main level with bedroom/bathroom Home Equipment: Shower seat      Prior Function Level of Independence: Independent               Hand Dominance   Dominant Hand: Right    Extremity/Trunk Assessment   Upper Extremity Assessment Upper Extremity Assessment: Defer to OT evaluation LUE Deficits / Details: decreased gross motor coordination--?nerve block LUE Coordination: decreased gross motor    Lower Extremity Assessment Lower Extremity Assessment: Overall WFL for tasks assessed    Cervical / Trunk Assessment Cervical / Trunk Assessment: Other exceptions Cervical / Trunk Exceptions: hx back sx and pain  Communication   Communication: No difficulties  Cognition Arousal/Alertness: Awake/alert Behavior During Therapy: WFL for tasks assessed/performed Overall Cognitive Status: Within Functional Limits for tasks assessed  General Comments      Exercises     Assessment/Plan    PT Assessment Patent does not need any further PT services  PT Problem List         PT  Treatment Interventions      PT Goals (Current goals can be found in the Care Plan section)  Acute Rehab PT Goals Patient Stated Goal: return home PT Goal Formulation: All assessment and education complete, DC therapy    Frequency     Barriers to discharge        Co-evaluation               AM-PAC PT "6 Clicks" Daily Activity  Outcome Measure Difficulty turning over in bed (including adjusting bedclothes, sheets and blankets)?: None Difficulty moving from lying on back to sitting on the side of the bed? : A Little Difficulty sitting down on and standing up from a chair with arms (e.g., wheelchair, bedside commode, etc,.)?: None Help needed moving to and from a bed to chair (including a wheelchair)?: None Help needed walking in hospital room?: None Help needed climbing 3-5 steps with a railing? : A Little 6 Click Score: 22    End of Session   Activity Tolerance: Patient tolerated treatment well Patient left: in chair;with call bell/phone within reach;with family/visitor present Nurse Communication: Mobility status(No f/u PT needed.  Ready for d/c from PT perspective) PT Visit Diagnosis: Difficulty in walking, not elsewhere classified (R26.2);Pain Pain - part of body: (back - chronic)    Time: 9030-0923 PT Time Calculation (min) (ACUTE ONLY): 11 min   Charges:   PT Evaluation $PT Eval Low Complexity: 1 Low     PT G Codes:   PT G-Codes **NOT FOR INPATIENT CLASS** Functional Assessment Tool Used: AM-PAC 6 Clicks Basic Mobility;Clinical judgement Functional Limitation: Mobility: Walking and moving around Mobility: Walking and Moving Around Current Status (R0076): At least 1 percent but less than 20 percent impaired, limited or restricted Mobility: Walking and Moving Around Goal Status [phone]): At least 1 percent but less than 20 percent impaired, limited or restricted Mobility: Walking and Moving Around Discharge Status [phone]): At least 1 percent but less than 20  percent impaired, limited or restricted    Carita Pian. Sanjuana Kava, [facility] Acute Rehab Services Pager [place] 11/25/2016, 11:43 AM

## 2016-12-01 ENCOUNTER — Encounter: Payer: Self-pay | Admitting: Internal Medicine

## 2016-12-01 ENCOUNTER — Ambulatory Visit (INDEPENDENT_AMBULATORY_CARE_PROVIDER_SITE_OTHER): Payer: Medicare Other | Admitting: Internal Medicine

## 2016-12-01 VITALS — BP 150/70 | HR 68 | Temp 98.3°F | Ht 69.0 in | Wt 182.4 lb

## 2016-12-01 DIAGNOSIS — I10 Essential (primary) hypertension: Secondary | ICD-10-CM | POA: Diagnosis not present

## 2016-12-01 DIAGNOSIS — B9789 Other viral agents as the cause of diseases classified elsewhere: Secondary | ICD-10-CM | POA: Diagnosis not present

## 2016-12-01 DIAGNOSIS — J069 Acute upper respiratory infection, unspecified: Secondary | ICD-10-CM

## 2016-12-01 DIAGNOSIS — E538 Deficiency of other specified B group vitamins: Secondary | ICD-10-CM

## 2016-12-01 MED ORDER — CYANOCOBALAMIN 1000 MCG/ML IJ SOLN
1000.0000 ug | Freq: Once | INTRAMUSCULAR | Status: AC
  Administered 2016-12-01: 1000 ug via INTRAMUSCULAR

## 2016-12-01 NOTE — Patient Instructions (Addendum)

## 2016-12-01 NOTE — Progress Notes (Signed)
Subjective:    Patient ID: [name], male    DOB: 04-29-1940, 76 y.o.   MRN: [id number]  HPI  76 year old patient who presents with a one-week history of increasing chest congestion cough and low-grade fever.  He describes some occasional dyspnea with some wheezing.  He has a remote very brief tobacco history of smoking. Presently is on dual antibiotic therapy following a traumatic injury to the left hand.  Past Medical History:  Diagnosis Date  . Allergy   . Anemia   . B12 deficiency anemia   . Blood transfusion without reported diagnosis   . CAD (coronary artery disease)   . Cancer ([place])    bladder cancer 8 times  . Colon polyps   . COPD (chronic obstructive pulmonary disease) ([place])   . Depression   . Esophagitis   . Esophagus, Barrett's   . GERD (gastroesophageal reflux disease)   . History of bladder cancer   . Hyperlipidemia   . Hypertension   . Hypothyroidism   . Localized osteoarthrosis, lower leg   . Low back pain   . Malaise and fatigue   . Stenosis of esophagus      Social History   Socioeconomic History  . Marital status: Married    Spouse name: Not on file  . Number of children: Not on file  . Years of education: Not on file  . Highest education level: Not on file  Social Needs  . Financial resource strain: Not on file  . Food insecurity - worry: Not on file  . Food insecurity - inability: Not on file  . Transportation needs - medical: Not on file  . Transportation needs - non-medical: Not on file  Occupational History  . Not on file  Tobacco Use  . Smoking status: Former Smoker    Last attempt to quit: 03/30/1976    Years since quitting: 40.7  . Smokeless tobacco: Never Used  . Tobacco comment: quit in 1978  Substance and Sexual Activity  . Alcohol use: No    Alcohol/week: 0.0 oz  . Drug use: No  . Sexual activity: Yes  Other Topics Concern  . Not on file  Social History Narrative  . Not on file    Past Surgical History:    Procedure Laterality Date  . bladder cancer    . CERVICAL DISCECTOMY     ACDF  . COLONOSCOPY  11/17/2005   normal   . CORONARY ARTERY BYPASS GRAFT     x4  . ESOPHAGOGASTRODUODENOSCOPY  04/29/2010  . KNEE ARTHROSCOPY    . LUMBAR LAMINECTOMY     and fusion  . POPLITEAL SYNOVIAL CYST EXCISION      Family History  Problem Relation Age of Onset  . Cancer Mother        melanoma  . Stroke Father   . Hypertension Father   . Coronary artery disease Unknown   . Colon cancer Neg Hx   . Esophageal cancer Neg Hx   . Stomach cancer Neg Hx   . Rectal cancer Neg Hx     Allergies  Allergen Reactions  . Adhesive [Tape]     Itching, reddened skin    Current Outpatient Medications on File Prior to Visit  Medication Sig Dispense Refill  . aspirin 81 MG tablet Take 81 mg by mouth daily.      . cephALEXin (KEFLEX) 500 MG capsule Take 1 capsule (500 mg total) 4 (four) times daily for 8 days by mouth. [place]  capsule 0  . Cyanocobalamin (B-12 COMPLIANCE INJECTION IJ) Inject 500 mg as directed every 30 (thirty) days.    . diclofenac sodium (VOLTAREN) 1 % GEL Apply 2 g topically 4 (four) times daily. 300 g 0  . etodolac (LODINE) 300 MG capsule TAKE ONE CAPSULE BY MOUTH EVERY 8 HOURS 180 capsule 1  . ezetimibe (ZETIA) 10 MG tablet TAKE 1 TABLET BY MOUTH DAILY 90 tablet 2  . folic acid (FOLVITE) 188 MCG tablet Take 400 mcg by mouth daily.      [name] gabapentin (NEURONTIN) 600 MG tablet Take 1 tablet (600 mg total) by mouth 3 (three) times daily. 90 tablet 1  . isosorbide mononitrate (IMDUR) 60 MG 24 hr tablet TAKE ONE TABLET BY MOUTH ONCE DAILY 30 tablet 11  . nebivolol (BYSTOLIC) 10 MG tablet Take 1 tablet (10 mg total) by mouth daily. 45 tablet 11  . nitroGLYCERIN (NITROSTAT) 0.4 MG SL tablet Place 1 tablet (0.4 mg total) under the tongue every 5 (five) minutes as needed for chest pain. 30 tablet 3  . omeprazole (PRILOSEC) 10 MG capsule Take 10 mg by mouth daily.    [name] oxyCODONE (OXY IR/ROXICODONE) 5 MG  immediate release tablet Take 1 tablet (5 mg total) every 4 (four) hours as needed for up to 7 days by mouth for moderate pain or breakthrough pain. 30 tablet 0  . oxyCODONE-acetaminophen (PERCOCET) 10-325 MG tablet Take 1 tablet by mouth 2 (two) times daily as needed. 180 tablet 0  . rOPINIRole (REQUIP) 2 MG tablet TAKE 1 TABLET BY MOUTH AT BEDTIME 90 tablet 1  . sulfamethoxazole-trimethoprim (BACTRIM DS,SEPTRA DS) 800-160 MG tablet Take 1 tablet 2 (two) times daily for 14 days by mouth. 28 tablet 0  . venlafaxine (EFFEXOR) 75 MG tablet TAKE ONE TABLET BY MOUTH ONCE DAILY. 90 tablet 3  . rosuvastatin (CRESTOR) 20 MG tablet Take 1 tablet (20 mg total) by mouth daily. 90 tablet 3   No current facility-administered medications on file prior to visit.     BP (!) 150/70 (BP Location: Left Arm, Patient Position: Sitting, Cuff Size: Normal)   Pulse 68   Temp 98.3 F (36.8 C) (Oral)   Ht 5\' 9"  (1.753 m)   Wt 182 lb 6.4 oz (82.7 kg)   SpO2 94%   BMI 26.94 kg/m     Review of Systems  Constitutional: Negative for appetite change, chills, fatigue and fever.  HENT: Negative for congestion, dental problem, ear pain, hearing loss, sore throat, tinnitus, trouble swallowing and voice change.   Eyes: Negative for pain, discharge and visual disturbance.  Respiratory: Positive for cough, shortness of breath and wheezing. Negative for chest tightness and stridor.   Cardiovascular: Negative for chest pain, palpitations and leg swelling.  Gastrointestinal: Negative for abdominal distention, abdominal pain, blood in stool, constipation, diarrhea, nausea and vomiting.  Genitourinary: Negative for difficulty urinating, discharge, flank pain, genital sores, hematuria and urgency.  Musculoskeletal: Negative for arthralgias, back pain, gait problem, joint swelling, myalgias and neck stiffness.  Skin: Negative for rash.  Neurological: Negative for dizziness, syncope, speech difficulty, weakness, numbness and  headaches.  Hematological: Negative for adenopathy. Does not bruise/bleed easily.  Psychiatric/Behavioral: Negative for behavioral problems and dysphoric mood. The patient is not nervous/anxious.        Objective:   Physical Exam  Constitutional: He is oriented to person, place, and time. He appears well-developed.  HENT:  Head: Normocephalic.  Right Ear: External ear normal.  Left Ear: External ear normal.  Eyes: Conjunctivae and EOM are normal.  Neck: Normal range of motion.  Cardiovascular: Normal rate and normal heart sounds.  Pulmonary/Chest: Effort normal and breath sounds normal. No respiratory distress. He has no wheezes. He has no rales.  No distress Chest is clear O2 saturation 94%   Abdominal: Bowel sounds are normal.  Musculoskeletal: Normal range of motion. He exhibits no edema or tenderness.  Neurological: He is alert and oriented to person, place, and time.  Psychiatric: He has a normal mood and affect. His behavior is normal.          Assessment & Plan:   Viral URI with cough Status post traumatic injury left hand.  Patient will complete the prophylactic antibiotic therapy Chronic low back pain  We will treat with Mucinex DM. Will call if unimproved Complete antibiotic therapy  Nyoka Cowden

## 2016-12-04 ENCOUNTER — Encounter: Payer: Self-pay | Admitting: Internal Medicine

## 2016-12-04 ENCOUNTER — Other Ambulatory Visit: Payer: Self-pay | Admitting: Internal Medicine

## 2016-12-07 DIAGNOSIS — E038 Other specified hypothyroidism: Secondary | ICD-10-CM | POA: Diagnosis not present

## 2016-12-07 DIAGNOSIS — M79642 Pain in left hand: Secondary | ICD-10-CM | POA: Diagnosis not present

## 2016-12-07 DIAGNOSIS — S61317D Laceration without foreign body of left little finger with damage to nail, subsequent encounter: Secondary | ICD-10-CM | POA: Diagnosis not present

## 2016-12-07 DIAGNOSIS — E785 Hyperlipidemia, unspecified: Secondary | ICD-10-CM | POA: Diagnosis not present

## 2016-12-07 DIAGNOSIS — Z79899 Other long term (current) drug therapy: Secondary | ICD-10-CM | POA: Diagnosis not present

## 2016-12-07 DIAGNOSIS — I10 Essential (primary) hypertension: Secondary | ICD-10-CM | POA: Diagnosis not present

## 2016-12-08 ENCOUNTER — Telehealth: Payer: Self-pay | Admitting: *Deleted

## 2016-12-08 ENCOUNTER — Telehealth: Payer: Self-pay | Admitting: Physician Assistant

## 2016-12-08 ENCOUNTER — Other Ambulatory Visit: Payer: Self-pay | Admitting: *Deleted

## 2016-12-08 DIAGNOSIS — E875 Hyperkalemia: Secondary | ICD-10-CM | POA: Diagnosis not present

## 2016-12-08 DIAGNOSIS — Z79899 Other long term (current) drug therapy: Secondary | ICD-10-CM

## 2016-12-08 LAB — COMPREHENSIVE METABOLIC PANEL
A/G RATIO: 1.3 (ref 1.2–2.2)
ALBUMIN: 4.4 g/dL (ref 3.5–4.8)
ALK PHOS: 92 IU/L (ref 39–117)
ALT: 16 IU/L (ref 0–44)
AST: 19 IU/L (ref 0–40)
BUN / CREAT RATIO: 19 (ref 10–24)
BUN: 40 mg/dL — ABNORMAL HIGH (ref 8–27)
Bilirubin Total: 0.5 mg/dL (ref 0.0–1.2)
CO2: 18 mmol/L — AB (ref 20–29)
CREATININE: 2.06 mg/dL — AB (ref 0.76–1.27)
Calcium: 9.5 mg/dL (ref 8.6–10.2)
Chloride: 106 mmol/L (ref 96–106)
GFR calc Af Amer: 36 mL/min/{1.73_m2} — ABNORMAL LOW (ref >59)
GFR, EST NON AFRICAN AMERICAN: 31 mL/min/{1.73_m2} — AB (ref >59)
GLOBULIN, TOTAL: 3.3 g/dL (ref 1.5–4.5)
Glucose: 100 mg/dL — ABNORMAL HIGH (ref 65–99)
POTASSIUM: 6.7 mmol/L — AB (ref 3.5–5.2)
SODIUM: 139 mmol/L (ref 134–144)
Total Protein: 7.7 g/dL (ref 6.0–8.5)

## 2016-12-08 LAB — CBC
HEMATOCRIT: 36.4 % — AB (ref 37.5–51.0)
HEMOGLOBIN: 11.8 g/dL — AB (ref 13.0–17.7)
MCH: 29.1 pg (ref 26.6–33.0)
MCHC: 32.4 g/dL (ref 31.5–35.7)
MCV: 90 fL (ref 79–97)
Platelets: 228 10*3/uL (ref 150–379)
RBC: 4.05 x10E6/uL — ABNORMAL LOW (ref 4.14–5.80)
RDW: 13.5 % (ref 12.3–15.4)
WBC: 9.1 10*3/uL (ref 3.4–10.8)

## 2016-12-08 LAB — LIPID PANEL
CHOL/HDL RATIO: 3.4 ratio (ref 0.0–5.0)
CHOLESTEROL TOTAL: 93 mg/dL — AB (ref 100–199)
HDL: 27 mg/dL — ABNORMAL LOW (ref >39)
LDL CALC: 41 mg/dL (ref 0–99)
TRIGLYCERIDES: 126 mg/dL (ref 0–149)
VLDL Cholesterol Cal: 25 mg/dL (ref 5–40)

## 2016-12-08 LAB — POTASSIUM: Potassium: 6.5 mmol/L — ABNORMAL HIGH (ref 3.5–5.2)

## 2016-12-08 LAB — TSH: TSH: 1.46 u[IU]/mL (ref 0.450–4.500)

## 2016-12-08 MED ORDER — SODIUM POLYSTYRENE SULFONATE 15 GM/60ML PO SUSP: 30.0000 g | Freq: Once | ORAL | 0 refills | Status: AC

## 2016-12-08 NOTE — Telephone Encounter (Signed)
[facility] called this morning with a critical value of K 6.7, confirmed on repeat and without hemolysis. I called the patient and he is not having any new symptoms. I sent a staff message to Dr. Claiborne Billings. I told the patient someone would call him back to let him know to proceed. I did not see a potassium supplement on the pt's medication list.

## 2016-12-08 NOTE — Telephone Encounter (Signed)
-----   Message from Troy Sine, MD sent at 12/08/2016  9:01 AM EST ----- Regarding: RE: K 6.7 Repeat stat K;  Avoid voltaren and K containing food; if still elevated will need treatement. TK ----- Message ----- From: Ledora Bottcher, PA Sent: 12/08/2016   7:07 AM To: Troy Sine, MD Subject: K 6.7                                          Critical value called from [facility] for K 6.7, confirmed on repeat without hemolysis. I called the patient this morning, he is not having any new symptoms and is not taking a potassium supplement to his knowledge. He states his K was high a few years ago.   I told him you or someone else would call him back and let him know how to proceed.  Thanks Angie

## 2016-12-08 NOTE — Telephone Encounter (Signed)
Received fax from [place], critical potassium 6.5.   Dr. Claiborne Billings aware and orders received.    Medication sent to preferred pharmacy (per pharmacist dosing).   Reviewed with pharmacist, patient needs to hold Bactrim tonight as well.  Patient aware, will hold Lodine, Bactrim, hydrate, avoid potassium rich foods and repeat lab tomorrow.

## 2016-12-08 NOTE — Telephone Encounter (Signed)
Spoke to patient, will come to have blood work repeated today at our office.    Aware of recommendations to avoid voltaren and potassium rich foods, verbalized understanding.

## 2016-12-09 DIAGNOSIS — E875 Hyperkalemia: Secondary | ICD-10-CM | POA: Diagnosis not present

## 2016-12-09 DIAGNOSIS — Z79899 Other long term (current) drug therapy: Secondary | ICD-10-CM | POA: Diagnosis not present

## 2016-12-09 LAB — BASIC METABOLIC PANEL
BUN/Creatinine Ratio: 19 (ref 10–24)
BUN: 40 mg/dL — ABNORMAL HIGH (ref 8–27)
CO2: 22 mmol/L (ref 20–29)
CREATININE: 2.15 mg/dL — AB (ref 0.76–1.27)
Calcium: 9.1 mg/dL (ref 8.6–10.2)
Chloride: 106 mmol/L (ref 96–106)
GFR, EST AFRICAN AMERICAN: 34 mL/min/{1.73_m2} — AB (ref >59)
GFR, EST NON AFRICAN AMERICAN: 29 mL/min/{1.73_m2} — AB (ref >59)
Glucose: 207 mg/dL — ABNORMAL HIGH (ref 65–99)
Potassium: 5.4 mmol/L — ABNORMAL HIGH (ref 3.5–5.2)
Sodium: 143 mmol/L (ref 134–144)

## 2016-12-16 DIAGNOSIS — I2583 Coronary atherosclerosis due to lipid rich plaque: Secondary | ICD-10-CM | POA: Diagnosis not present

## 2016-12-16 DIAGNOSIS — E782 Mixed hyperlipidemia: Secondary | ICD-10-CM | POA: Diagnosis not present

## 2016-12-16 DIAGNOSIS — I2510 Atherosclerotic heart disease of native coronary artery without angina pectoris: Secondary | ICD-10-CM | POA: Diagnosis not present

## 2016-12-16 DIAGNOSIS — Z79899 Other long term (current) drug therapy: Secondary | ICD-10-CM | POA: Diagnosis not present

## 2016-12-17 ENCOUNTER — Other Ambulatory Visit: Payer: Self-pay | Admitting: *Deleted

## 2016-12-17 DIAGNOSIS — Z79899 Other long term (current) drug therapy: Secondary | ICD-10-CM

## 2016-12-17 DIAGNOSIS — E875 Hyperkalemia: Secondary | ICD-10-CM

## 2016-12-17 LAB — COMPREHENSIVE METABOLIC PANEL
ALT: 11 IU/L (ref 0–44)
AST: 18 IU/L (ref 0–40)
Albumin/Globulin Ratio: 1.3 (ref 1.2–2.2)
Albumin: 4.1 g/dL (ref 3.5–4.8)
Alkaline Phosphatase: 78 IU/L (ref 39–117)
BUN/Creatinine Ratio: 17 (ref 10–24)
BUN: 23 mg/dL (ref 8–27)
Bilirubin Total: 0.4 mg/dL (ref 0.0–1.2)
CALCIUM: 9.2 mg/dL (ref 8.6–10.2)
CO2: 24 mmol/L (ref 20–29)
CREATININE: 1.36 mg/dL — AB (ref 0.76–1.27)
Chloride: 106 mmol/L (ref 96–106)
GFR calc Af Amer: 59 mL/min/{1.73_m2} — ABNORMAL LOW (ref >59)
GFR, EST NON AFRICAN AMERICAN: 51 mL/min/{1.73_m2} — AB (ref >59)
GLUCOSE: 103 mg/dL — AB (ref 65–99)
Globulin, Total: 3.2 g/dL (ref 1.5–4.5)
Potassium: 5.9 mmol/L — ABNORMAL HIGH (ref 3.5–5.2)
Sodium: 144 mmol/L (ref 134–144)
Total Protein: 7.3 g/dL (ref 6.0–8.5)

## 2016-12-17 LAB — LIPID PANEL
CHOL/HDL RATIO: 3.7 ratio (ref 0.0–5.0)
Cholesterol, Total: 100 mg/dL (ref 100–199)
HDL: 27 mg/dL — AB (ref >39)
LDL Calculated: 45 mg/dL (ref 0–99)
TRIGLYCERIDES: 142 mg/dL (ref 0–149)
VLDL Cholesterol Cal: 28 mg/dL (ref 5–40)

## 2016-12-17 MED ORDER — SODIUM POLYSTYRENE SULFONATE 15 GM/60ML PO SUSP: 15.0000 g | Freq: Once | ORAL | 0 refills | Status: AC

## 2016-12-23 DIAGNOSIS — E875 Hyperkalemia: Secondary | ICD-10-CM | POA: Diagnosis not present

## 2016-12-23 DIAGNOSIS — Z4789 Encounter for other orthopedic aftercare: Secondary | ICD-10-CM | POA: Diagnosis not present

## 2016-12-23 DIAGNOSIS — Z79899 Other long term (current) drug therapy: Secondary | ICD-10-CM | POA: Diagnosis not present

## 2016-12-24 LAB — BASIC METABOLIC PANEL
BUN/Creatinine Ratio: 18 (ref 10–24)
BUN: 23 mg/dL (ref 8–27)
CALCIUM: 8.9 mg/dL (ref 8.6–10.2)
CO2: 26 mmol/L (ref 20–29)
CREATININE: 1.29 mg/dL — AB (ref 0.76–1.27)
Chloride: 106 mmol/L (ref 96–106)
GFR calc Af Amer: 63 mL/min/{1.73_m2} (ref >59)
GFR, EST NON AFRICAN AMERICAN: 54 mL/min/{1.73_m2} — AB (ref >59)
Glucose: 95 mg/dL (ref 65–99)
Potassium: 5 mmol/L (ref 3.5–5.2)
Sodium: 144 mmol/L (ref 134–144)

## 2017-01-07 ENCOUNTER — Ambulatory Visit: Payer: Medicare Other | Admitting: Internal Medicine

## 2017-01-07 DIAGNOSIS — S61317D Laceration without foreign body of left little finger with damage to nail, subsequent encounter: Secondary | ICD-10-CM | POA: Diagnosis not present

## 2017-01-07 DIAGNOSIS — S61112D Laceration without foreign body of left thumb with damage to nail, subsequent encounter: Secondary | ICD-10-CM | POA: Diagnosis not present

## 2017-01-07 DIAGNOSIS — S61313D Laceration without foreign body of left middle finger with damage to nail, subsequent encounter: Secondary | ICD-10-CM | POA: Diagnosis not present

## 2017-01-07 DIAGNOSIS — S61311D Laceration without foreign body of left index finger with damage to nail, subsequent encounter: Secondary | ICD-10-CM | POA: Diagnosis not present

## 2017-01-13 ENCOUNTER — Ambulatory Visit: Payer: Medicare Other | Admitting: Internal Medicine

## 2017-01-15 ENCOUNTER — Other Ambulatory Visit: Payer: Self-pay | Admitting: Internal Medicine

## 2017-01-22 ENCOUNTER — Ambulatory Visit (INDEPENDENT_AMBULATORY_CARE_PROVIDER_SITE_OTHER): Payer: Medicare Other

## 2017-01-22 DIAGNOSIS — D225 Melanocytic nevi of trunk: Secondary | ICD-10-CM | POA: Diagnosis not present

## 2017-01-22 DIAGNOSIS — X32XXXA Exposure to sunlight, initial encounter: Secondary | ICD-10-CM | POA: Diagnosis not present

## 2017-01-22 DIAGNOSIS — L218 Other seborrheic dermatitis: Secondary | ICD-10-CM | POA: Diagnosis not present

## 2017-01-22 DIAGNOSIS — L0202 Furuncle of face: Secondary | ICD-10-CM | POA: Diagnosis not present

## 2017-01-22 DIAGNOSIS — E538 Deficiency of other specified B group vitamins: Secondary | ICD-10-CM

## 2017-01-22 DIAGNOSIS — L570 Actinic keratosis: Secondary | ICD-10-CM | POA: Diagnosis not present

## 2017-01-22 MED ORDER — CYANOCOBALAMIN 1000 MCG/ML IJ SOLN
1000.0000 ug | Freq: Once | INTRAMUSCULAR | Status: AC
  Administered 2017-01-22: 1000 ug via INTRAMUSCULAR

## 2017-01-27 ENCOUNTER — Encounter: Payer: Self-pay | Admitting: Internal Medicine

## 2017-01-27 ENCOUNTER — Ambulatory Visit (INDEPENDENT_AMBULATORY_CARE_PROVIDER_SITE_OTHER): Payer: Medicare Other | Admitting: Internal Medicine

## 2017-01-27 VITALS — BP 132/70 | HR 64 | Temp 98.5°F | Ht 69.0 in | Wt 184.8 lb

## 2017-01-27 DIAGNOSIS — G8929 Other chronic pain: Secondary | ICD-10-CM

## 2017-01-27 DIAGNOSIS — M545 Low back pain, unspecified: Secondary | ICD-10-CM

## 2017-01-27 MED ORDER — OXYCODONE-ACETAMINOPHEN 10-325 MG PO TABS: 1.0000 | ORAL_TABLET | Freq: Two times a day (BID) | ORAL | 0 refills | Status: DC | PRN

## 2017-01-27 NOTE — Patient Instructions (Signed)
Limit your sodium (Salt) intake  Return in 6 months for follow-up  

## 2017-01-27 NOTE — Progress Notes (Signed)
   Subjective:    Patient ID: [name], male    DOB: [birth year], 77 y.o.   MRN: [id number]  HPI  77 year old patient who is seen today for a pain management visit. He has significant arthritis and chronic low back pain as well as a peripheral neuropathy and is on chronic narcotics  He continues to have significant low back pain as well as significant knee and hip pain He states he generally uses oxycodone 1 half tablet 2 or 3 times daily  [place] printed, initialed  and scanned into the EMR.  Patient was seen by hand surgery on November 6 and was dispensed additional oxycodone at that time  Indication for chronic opioid: chronic back pain as well as generalized arthritis affecting primarily the hips and the knees Medication and dose: Percocet 1 half to 1 tablet 2-3 times daily # pills per month: maximum 90 per month Last UDS date: 10/26/2016 Pain contract signed (Y/N): pain contract signed 10/26/2016 Date narcotic database last reviewed (include red flags):  January 27, 2017  Review of Systems     Objective:   Physical Exam        Assessment & Plan:   Encounter for chronic pain management (G89.29) Narcotic use  (711.90) Pain management contract signed (Z02.89)  Nyoka Cowden

## 2017-02-09 DIAGNOSIS — S62522B Displaced fracture of distal phalanx of left thumb, initial encounter for open fracture: Secondary | ICD-10-CM | POA: Diagnosis not present

## 2017-02-16 ENCOUNTER — Other Ambulatory Visit: Payer: Self-pay | Admitting: Internal Medicine

## 2017-02-17 DIAGNOSIS — S62621D Displaced fracture of medial phalanx of left index finger, subsequent encounter for fracture with routine healing: Secondary | ICD-10-CM | POA: Diagnosis not present

## 2017-02-17 DIAGNOSIS — S62639B Displaced fracture of distal phalanx of unspecified finger, initial encounter for open fracture: Secondary | ICD-10-CM | POA: Insufficient documentation

## 2017-02-17 DIAGNOSIS — S61311D Laceration without foreign body of left index finger with damage to nail, subsequent encounter: Secondary | ICD-10-CM | POA: Diagnosis not present

## 2017-02-17 DIAGNOSIS — S61319A Laceration without foreign body of unspecified finger with damage to nail, initial encounter: Secondary | ICD-10-CM | POA: Insufficient documentation

## 2017-02-17 DIAGNOSIS — S61019A Laceration without foreign body of unspecified thumb without damage to nail, initial encounter: Secondary | ICD-10-CM | POA: Insufficient documentation

## 2017-02-17 DIAGNOSIS — S61218A Laceration without foreign body of other finger without damage to nail, initial encounter: Secondary | ICD-10-CM | POA: Insufficient documentation

## 2017-02-17 DIAGNOSIS — S62629B Displaced fracture of medial phalanx of unspecified finger, initial encounter for open fracture: Secondary | ICD-10-CM | POA: Insufficient documentation

## 2017-03-02 ENCOUNTER — Ambulatory Visit (INDEPENDENT_AMBULATORY_CARE_PROVIDER_SITE_OTHER): Payer: Medicare Other

## 2017-03-02 DIAGNOSIS — E538 Deficiency of other specified B group vitamins: Secondary | ICD-10-CM | POA: Diagnosis not present

## 2017-03-02 MED ORDER — CYANOCOBALAMIN 1000 MCG/ML IJ SOLN
1000.0000 ug | Freq: Once | INTRAMUSCULAR | Status: AC
  Administered 2017-03-02: 1000 ug via INTRAMUSCULAR

## 2017-03-02 NOTE — Progress Notes (Signed)
  Per orders of Dr.Kwiatkowski, injection of vitamin B 12 given by Abelardo Diesel. Patient tolerated injection well.

## 2017-03-15 DIAGNOSIS — M79642 Pain in left hand: Secondary | ICD-10-CM | POA: Diagnosis not present

## 2017-03-19 DIAGNOSIS — S62621D Displaced fracture of medial phalanx of left index finger, subsequent encounter for fracture with routine healing: Secondary | ICD-10-CM | POA: Diagnosis not present

## 2017-03-19 DIAGNOSIS — S61313D Laceration without foreign body of left middle finger with damage to nail, subsequent encounter: Secondary | ICD-10-CM | POA: Diagnosis not present

## 2017-03-27 ENCOUNTER — Other Ambulatory Visit: Payer: Self-pay | Admitting: Cardiovascular Disease

## 2017-04-01 DIAGNOSIS — M79642 Pain in left hand: Secondary | ICD-10-CM | POA: Diagnosis not present

## 2017-04-02 ENCOUNTER — Telehealth: Payer: Self-pay | Admitting: Family Medicine

## 2017-04-02 NOTE — Telephone Encounter (Signed)
Copied from [place]. Topic: General - Other >> Apr 02, 2017 10:38 AM Yvette Rack wrote: Reason for CRM: patient daughter Zettie Pho calling wanting someone to call her about father she would like for her father to be referred to another Urologist he hasn't went to go see one for 2 yrs now he has leg and groin pain been complaining for sometimes/months daughter says and she said that he need to be seen by a Urologist

## 2017-04-02 NOTE — Telephone Encounter (Signed)
Spoke with patient daughter and informed her that Dr.Kwiatkowski is out of the office and will get back to her as soon as possible.

## 2017-04-05 ENCOUNTER — Telehealth: Payer: Self-pay | Admitting: Internal Medicine

## 2017-04-05 NOTE — Telephone Encounter (Signed)
Suggest return office visit to evaluate

## 2017-04-05 NOTE — Telephone Encounter (Unsigned)
Copied from [place]. Topic: General - Other >> Apr 02, 2017 10:38 AM Yvette Rack wrote: Reason for CRM: patient daughter Zettie Pho calling wanting someone to call her about father she would like for her father to be referred to another Urologist he hasn't went to go see one for 2 yrs now he has leg and groin pain been complaining for sometimes/months daughter says and she said that he need to be seen by a Urologist   >> Apr 05, 2017  5:13 PM Neva Seat wrote: Pt's daughter needs a call back to discuss the urgency of pt to be referred to a urologist.   [phone] Earnest Bailey

## 2017-04-06 NOTE — Telephone Encounter (Signed)
Spoke to daughter a few mins ago, please see other telephone note.

## 2017-04-06 NOTE — Telephone Encounter (Signed)
Called pt and was unable to reach him. Called [name] his daughter and informed her to have pt make an appointment.

## 2017-04-08 ENCOUNTER — Other Ambulatory Visit: Payer: Self-pay | Admitting: Internal Medicine

## 2017-04-08 ENCOUNTER — Telehealth: Payer: Self-pay

## 2017-04-08 NOTE — Telephone Encounter (Signed)
Copied from [place] [phone]. Topic: Appointment Scheduling - Scheduling Inquiry for Clinic >> Apr 08, 2017  9:25 AM Burnis Medin, NT wrote: Reason for CRM: Patient called and wanted to get scheduled for his B12  shot. Pt would like a call back for scheduling. Also patient request if he could get shot at a quarter to 12 on any day.

## 2017-04-09 ENCOUNTER — Ambulatory Visit (INDEPENDENT_AMBULATORY_CARE_PROVIDER_SITE_OTHER): Payer: Medicare Other

## 2017-04-09 DIAGNOSIS — E538 Deficiency of other specified B group vitamins: Secondary | ICD-10-CM

## 2017-04-09 MED ORDER — CYANOCOBALAMIN 1000 MCG/ML IJ SOLN
1000.0000 ug | Freq: Once | INTRAMUSCULAR | Status: AC
  Administered 2017-04-09: 1000 ug via INTRAMUSCULAR

## 2017-04-09 NOTE — Progress Notes (Signed)
Per orders of Dr. Sharren Bridge, injection of Cyanocobalamin given by Wyvonne Lenz. Patient tolerated injection well.

## 2017-04-09 NOTE — Telephone Encounter (Signed)
Noted  

## 2017-04-09 NOTE — Telephone Encounter (Signed)
Pt called back to check and wanted to come in today. I have placed him on the nurse schedule for his b12 shot.

## 2017-04-15 DIAGNOSIS — S61317D Laceration without foreign body of left little finger with damage to nail, subsequent encounter: Secondary | ICD-10-CM | POA: Diagnosis not present

## 2017-04-15 DIAGNOSIS — S62631D Displaced fracture of distal phalanx of left index finger, subsequent encounter for fracture with routine healing: Secondary | ICD-10-CM | POA: Diagnosis not present

## 2017-04-15 DIAGNOSIS — G5602 Carpal tunnel syndrome, left upper limb: Secondary | ICD-10-CM | POA: Diagnosis not present

## 2017-04-19 ENCOUNTER — Other Ambulatory Visit: Payer: Self-pay | Admitting: Internal Medicine

## 2017-04-21 ENCOUNTER — Other Ambulatory Visit: Payer: Self-pay | Admitting: Internal Medicine

## 2017-04-21 NOTE — Telephone Encounter (Signed)
Copied from [place] [phone]. Topic: Quick Communication - Rx Refill/Question >> Apr 21, 2017 12:59 PM Arletha Grippe wrote: Medication: oxyCODONE-acetaminophen (PERCOCET) 10-325 MG tablet Has the patient contacted their pharmacy? No. (Agent: If no, request that the patient contact the pharmacy for the refill.) Preferred Pharmacy (with phone number or street name): pick up only [phone]  Agent: Please be advised that RX refills may take up to 3 business days. We ask that you follow-up with your pharmacy.

## 2017-04-21 NOTE — Telephone Encounter (Signed)
Oxycodone-acetaminophen 10-325 LOV: 01/27/17 PCP: Dr Inda Merlin Pharmacy: [facility] - [place], [place]

## 2017-04-22 ENCOUNTER — Other Ambulatory Visit: Payer: Self-pay

## 2017-04-22 MED ORDER — OXYCODONE-ACETAMINOPHEN 10-325 MG PO TABS: 1.0000 | ORAL_TABLET | Freq: Two times a day (BID) | ORAL | 0 refills | Status: DC | PRN

## 2017-04-22 NOTE — Telephone Encounter (Signed)
Hi Dr.Fry  Dr.Kwiatkowski forgot to sign 2 prescriptions this is one of them can you e-scribe this for me please.

## 2017-04-23 NOTE — Telephone Encounter (Signed)
These will need to wait for Dr. Raliegh Ip to sign

## 2017-04-26 ENCOUNTER — Other Ambulatory Visit: Payer: Self-pay

## 2017-04-26 MED ORDER — OXYCODONE-ACETAMINOPHEN 10-325 MG PO TABS: 1.0000 | ORAL_TABLET | Freq: Two times a day (BID) | ORAL | 0 refills | Status: DC | PRN

## 2017-04-27 ENCOUNTER — Ambulatory Visit: Payer: Medicare Other | Admitting: Internal Medicine

## 2017-04-27 DIAGNOSIS — Z0289 Encounter for other administrative examinations: Secondary | ICD-10-CM

## 2017-05-10 DIAGNOSIS — C672 Malignant neoplasm of lateral wall of bladder: Secondary | ICD-10-CM | POA: Diagnosis not present

## 2017-05-10 DIAGNOSIS — N35014 Post-traumatic urethral stricture, male, unspecified: Secondary | ICD-10-CM | POA: Diagnosis not present

## 2017-05-10 DIAGNOSIS — Z8551 Personal history of malignant neoplasm of bladder: Secondary | ICD-10-CM | POA: Diagnosis not present

## 2017-05-10 DIAGNOSIS — Z87440 Personal history of urinary (tract) infections: Secondary | ICD-10-CM | POA: Diagnosis not present

## 2017-05-27 DIAGNOSIS — G5600 Carpal tunnel syndrome, unspecified upper limb: Secondary | ICD-10-CM | POA: Insufficient documentation

## 2017-05-27 DIAGNOSIS — G5602 Carpal tunnel syndrome, left upper limb: Secondary | ICD-10-CM | POA: Diagnosis not present

## 2017-05-27 DIAGNOSIS — S62621S Displaced fracture of medial phalanx of left index finger, sequela: Secondary | ICD-10-CM | POA: Diagnosis not present

## 2017-05-27 DIAGNOSIS — S62631S Displaced fracture of distal phalanx of left index finger, sequela: Secondary | ICD-10-CM | POA: Diagnosis not present

## 2017-06-01 DIAGNOSIS — I10 Essential (primary) hypertension: Secondary | ICD-10-CM | POA: Diagnosis not present

## 2017-06-01 DIAGNOSIS — Z951 Presence of aortocoronary bypass graft: Secondary | ICD-10-CM | POA: Diagnosis not present

## 2017-06-01 DIAGNOSIS — C672 Malignant neoplasm of lateral wall of bladder: Secondary | ICD-10-CM | POA: Diagnosis not present

## 2017-06-01 DIAGNOSIS — G4733 Obstructive sleep apnea (adult) (pediatric): Secondary | ICD-10-CM | POA: Diagnosis not present

## 2017-06-01 DIAGNOSIS — Z8551 Personal history of malignant neoplasm of bladder: Secondary | ICD-10-CM | POA: Diagnosis not present

## 2017-06-01 DIAGNOSIS — Z9889 Other specified postprocedural states: Secondary | ICD-10-CM | POA: Diagnosis not present

## 2017-06-01 DIAGNOSIS — N35014 Post-traumatic urethral stricture, male, unspecified: Secondary | ICD-10-CM | POA: Diagnosis not present

## 2017-06-01 DIAGNOSIS — N35919 Unspecified urethral stricture, male, unspecified site: Secondary | ICD-10-CM | POA: Diagnosis not present

## 2017-06-01 DIAGNOSIS — I2510 Atherosclerotic heart disease of native coronary artery without angina pectoris: Secondary | ICD-10-CM | POA: Diagnosis not present

## 2017-06-01 DIAGNOSIS — N400 Enlarged prostate without lower urinary tract symptoms: Secondary | ICD-10-CM | POA: Diagnosis not present

## 2017-06-01 DIAGNOSIS — K219 Gastro-esophageal reflux disease without esophagitis: Secondary | ICD-10-CM | POA: Diagnosis not present

## 2017-06-01 DIAGNOSIS — C679 Malignant neoplasm of bladder, unspecified: Secondary | ICD-10-CM | POA: Diagnosis not present

## 2017-06-01 DIAGNOSIS — E785 Hyperlipidemia, unspecified: Secondary | ICD-10-CM | POA: Diagnosis not present

## 2017-06-01 DIAGNOSIS — G629 Polyneuropathy, unspecified: Secondary | ICD-10-CM | POA: Diagnosis not present

## 2017-06-03 ENCOUNTER — Ambulatory Visit (INDEPENDENT_AMBULATORY_CARE_PROVIDER_SITE_OTHER): Payer: Medicare Other | Admitting: Family Medicine

## 2017-06-03 ENCOUNTER — Telehealth: Payer: Self-pay | Admitting: Internal Medicine

## 2017-06-03 DIAGNOSIS — E538 Deficiency of other specified B group vitamins: Secondary | ICD-10-CM

## 2017-06-03 MED ORDER — CYANOCOBALAMIN 1000 MCG/ML IJ SOLN
1000.0000 ug | Freq: Once | INTRAMUSCULAR | Status: AC
  Administered 2017-06-03: 1000 ug via INTRAMUSCULAR

## 2017-06-03 NOTE — Telephone Encounter (Signed)
Refill for control med Oxycodone-acetaminophen 10-325 MG  LR 04/26/17 #60 tabs   LOV  01/27/17 Dr. Inda Merlin

## 2017-06-03 NOTE — Telephone Encounter (Signed)
Patient wants a prescription for B12 sent to the pharmacy.  His daughter is going to give him his B12 shots from now on.

## 2017-06-03 NOTE — Progress Notes (Signed)
Per orders of Dr. Kwiatkowski, injection of Vitamin B 12 given by Crestina Strike ANN. Patient tolerated injection well. 

## 2017-06-03 NOTE — Telephone Encounter (Signed)
Copied from [place] [id number]. Topic: Quick Communication - Rx Refill/Question >> Jun 03, 2017  9:23 AM Bea Graff, NT wrote: Medication: oxyCODONE-acetaminophen (PERCOCET) 10-325 MG tablet [[id number]] Has the patient contacted their pharmacy? Yes.   (Agent: If no, request that the patient contact the pharmacy for the refill.) Preferred Pharmacy (with phone number or street name): [place], [place] - [address] [phone] (Phone) [phone] (Fax)     Agent: Please be advised that RX refills may take up to 3 business days. We ask that you follow-up with your pharmacy.

## 2017-06-04 ENCOUNTER — Telehealth: Payer: Self-pay | Admitting: Cardiovascular Disease

## 2017-06-04 ENCOUNTER — Other Ambulatory Visit: Payer: Self-pay

## 2017-06-04 MED ORDER — CYANOCOBALAMIN 1000 MCG/ML IJ SOLN: 1000.0000 ug | Freq: Once | INTRAMUSCULAR | 0 refills | Status: DC

## 2017-06-04 MED ORDER — "NEEDLE (DISP) 25G X 1"" MISC": 0 refills | Status: DC

## 2017-06-04 MED ORDER — "SYRINGE/NEEDLE (DISP) 25G X 1"" 3 ML MISC": 0 refills | Status: DC

## 2017-06-04 NOTE — Telephone Encounter (Signed)
ok 

## 2017-06-04 NOTE — Telephone Encounter (Signed)
Patient came by office and stated that he only wanted to know if it was sue not a refill. No further action needed.

## 2017-06-04 NOTE — Telephone Encounter (Signed)
Spoke to pt needles syringes and Rx sent to [facility].

## 2017-06-04 NOTE — Telephone Encounter (Signed)
   [facility] Pre-operative Risk Assessment    Request for surgical clearance:  1. What type of surgery is being performed? Right wrist: rt. L.O. CTR   2. When is this surgery scheduled? TBD   3. What type of clearance is required (medical clearance vs. Pharmacy clearance to hold med vs. Both)? Both   4. Are there any medications that need to be held prior to surgery and how long?  Non-specified but is on aspirin   5. Practice name and name of physician performing surgery?  Roseanne Kaufman, MD with [facility]   6. What is your office phone number [phone]    7.   What is your office fax number [phone] - Orson Slick = surgical scheduler  8.   Anesthesia type (None, local, MAC, general) ? Local w/IV sedation or MAC   Sheral Apley M 06/04/2017, 2:00 PM  _________________________________________________________________   (provider comments below)

## 2017-06-04 NOTE — Telephone Encounter (Signed)
Okay for me to send? Please advise

## 2017-06-09 NOTE — Telephone Encounter (Signed)
Left message for patient to call back  

## 2017-06-09 NOTE — Telephone Encounter (Signed)
   Primary Cardiologist:Thomas Claiborne Billings, MD (last seen 10/2016)  Chart reviewed as part of pre-operative protocol coverage. Because of Cristian Davitt Gutierres's past medical history and time since last visit, he/she will require a follow-up visit in order to better assess preoperative cardiovascular risk.  Pre-op covering staff: - Please schedule appointment and call patient to inform them. - Please contact requesting surgeon's office via preferred method (i.e, phone, fax) to inform them of need for appointment prior to surgery.  [place], [place]  06/09/2017, 2:26 PM

## 2017-06-10 NOTE — Telephone Encounter (Signed)
Left message to call back  

## 2017-06-15 NOTE — Telephone Encounter (Signed)
Follow up    Patient returning call about preop

## 2017-06-15 NOTE — Telephone Encounter (Signed)
SPOKE WITH PT AND PT SCHEDULED 06-16-17  8 AM  WITH [facility]  SURGICAL CLEARANCE

## 2017-06-16 ENCOUNTER — Ambulatory Visit: Payer: Medicare Other | Admitting: Cardiology

## 2017-06-16 ENCOUNTER — Encounter: Payer: Self-pay | Admitting: Cardiology

## 2017-06-16 VITALS — BP 140/72 | HR 67 | Ht 69.0 in | Wt 189.8 lb

## 2017-06-16 DIAGNOSIS — E785 Hyperlipidemia, unspecified: Secondary | ICD-10-CM

## 2017-06-16 DIAGNOSIS — I10 Essential (primary) hypertension: Secondary | ICD-10-CM

## 2017-06-16 DIAGNOSIS — N183 Chronic kidney disease, stage 3 unspecified: Secondary | ICD-10-CM | POA: Insufficient documentation

## 2017-06-16 DIAGNOSIS — I2510 Atherosclerotic heart disease of native coronary artery without angina pectoris: Secondary | ICD-10-CM | POA: Diagnosis not present

## 2017-06-16 DIAGNOSIS — Z01810 Encounter for preprocedural cardiovascular examination: Secondary | ICD-10-CM | POA: Insufficient documentation

## 2017-06-16 DIAGNOSIS — Z951 Presence of aortocoronary bypass graft: Secondary | ICD-10-CM

## 2017-06-16 NOTE — Patient Instructions (Signed)
Medication Instructions: Your physician recommends that you continue on your current medications as directed. Please refer to the Current Medication list given to you today.  If you need a refill on your cardiac medications before your next appointment, please call your pharmacy.     Follow-Up: Your physician wants you to follow-up in 6 months with Dr. Kelly. You will receive a reminder letter in the mail two months in advance. If you don't receive a letter, please call our office at [phone] to schedule this follow-up appointment.   Special Instructions:    Thank you for choosing [facility]!!    

## 2017-06-16 NOTE — Telephone Encounter (Signed)
Clearance office note on 06/16/17 routed to [facility] Attn: Orson Slick.

## 2017-06-16 NOTE — Progress Notes (Signed)
06/16/2017 [name]   11-08-1940  [id number]  Primary Physician Marletta Lor, MD Primary Cardiologist: Dr Claiborne Billings  HPI:  76 y/o male well know to Dr Claiborne Billings with a history of CABG in 2001. He had a Myoview in  2016 that was low risk. Echo Oct 2018 showed an EF of 60-65% with moderate LVH and grade 2 DD. He was taken off Cozaar in the past secondary to hyperkalemia. Overall he has done pretty well from a cardiac standpoint. He is active, doing yard work without significant issues. He has chronic "fatigue" but denies chest pain. He is in the office today for pre op clearance. He injusred his hand in a table saw accident in Nov and needs repair (Dr Amedeo Plenty).    Current Outpatient Medications  Medication Sig Dispense Refill  . aspirin 81 MG tablet Take 81 mg by mouth daily.      [name] BYSTOLIC 10 MG tablet TAKE ONE TABLET BY MOUTH ONCE DAILY 45 tablet 7  . Cyanocobalamin (B-12 COMPLIANCE INJECTION IJ) Inject 500 mg as directed every 30 (thirty) days.    [name] etodolac (LODINE) 300 MG capsule TAKE 1 CAPSULE BY MOUTH EVERY 8 HOURS 180 capsule 1  . ezetimibe (ZETIA) 10 MG tablet TAKE 1 TABLET BY MOUTH DAILY 90 tablet 2  . folic acid (FOLVITE) 546 MCG tablet Take 400 mcg by mouth daily.      [name] gabapentin (NEURONTIN) 600 MG tablet Take 1 tablet (600 mg total) by mouth 3 (three) times daily. 90 tablet 1  . isosorbide mononitrate (IMDUR) 60 MG 24 hr tablet TAKE 1 TABLET BY MOUTH ONCE DAILY 90 tablet 0  . NEEDLE, DISP, 25 G (B-D DISP NEEDLE 25GX1") 25G X 1" MISC Inject 1000 mcg into muscle once a month. 50 each 0  . nitroGLYCERIN (NITROSTAT) 0.4 MG SL tablet Place 1 tablet (0.4 mg total) under the tongue every 5 (five) minutes as needed for chest pain. 30 tablet 3  . omeprazole (PRILOSEC) 10 MG capsule Take 10 mg by mouth daily.    [name] oxyCODONE-acetaminophen (PERCOCET) 10-325 MG tablet Take 1 tablet by mouth 2 (two) times daily as needed. 60 tablet 0  . rOPINIRole (REQUIP) 2 MG tablet TAKE 1  TABLET BY MOUTH AT BEDTIME 90 tablet 1  . SYRINGE-NEEDLE, DISP, 3 ML (BD ECLIPSE SYRINGE) 25G X 1" 3 ML MISC Use to inject 1000 mcg into muscle once a month. 50 each 0  . venlafaxine (EFFEXOR) 75 MG tablet TAKE ONE TABLET BY MOUTH ONCE DAILY 90 tablet 3  . rosuvastatin (CRESTOR) 20 MG tablet Take 1 tablet (20 mg total) by mouth daily. 90 tablet 3   No current facility-administered medications for this visit.     Allergies  Allergen Reactions  . Losartan Potassium Other (See Comments)    Hyperkalemia  . Adhesive [Tape]     Itching, reddened skin    Past Medical History:  Diagnosis Date  . Allergy   . Anemia   . B12 deficiency anemia   . Blood transfusion without reported diagnosis   . CAD (coronary artery disease)   . Cancer ([place])    bladder cancer 8 times  . Colon polyps   . COPD (chronic obstructive pulmonary disease) ([place])   . Depression   . Esophagitis   . Esophagus, Barrett's   . GERD (gastroesophageal reflux disease)   . History of bladder cancer   . Hyperlipidemia   . Hypertension   . Hypothyroidism   .  Localized osteoarthrosis, lower leg   . Low back pain   . Malaise and fatigue   . Stenosis of esophagus     Social History   Socioeconomic History  . Marital status: Married    Spouse name: Not on file  . Number of children: Not on file  . Years of education: Not on file  . Highest education level: Not on file  Occupational History  . Not on file  Social Needs  . Financial resource strain: Not on file  . Food insecurity:    Worry: Not on file    Inability: Not on file  . Transportation needs:    Medical: Not on file    Non-medical: Not on file  Tobacco Use  . Smoking status: Former Smoker    Last attempt to quit: 03/30/1976    Years since quitting: 41.2  . Smokeless tobacco: Never Used  . Tobacco comment: quit in 1978  Substance and Sexual Activity  . Alcohol use: No    Alcohol/week: 0.0 oz  . Drug use: No  . Sexual activity: Yes  Lifestyle    . Physical activity:    Days per week: Not on file    Minutes per session: Not on file  . Stress: Not on file  Relationships  . Social connections:    Talks on phone: Not on file    Gets together: Not on file    Attends religious service: Not on file    Active member of club or organization: Not on file    Attends meetings of clubs or organizations: Not on file    Relationship status: Not on file  . Intimate partner violence:    Fear of current or ex partner: Not on file    Emotionally abused: Not on file    Physically abused: Not on file    Forced sexual activity: Not on file  Other Topics Concern  . Not on file  Social History Narrative  . Not on file     Family History  Problem Relation Age of Onset  . Cancer Mother        melanoma  . Stroke Father   . Hypertension Father   . Coronary artery disease Unknown   . Colon cancer Neg Hx   . Esophageal cancer Neg Hx   . Stomach cancer Neg Hx   . Rectal cancer Neg Hx      Review of Systems: General: negative for chills, fever, night sweats or weight changes.  Cardiovascular: negative for chest pain, dyspnea on exertion, edema, orthopnea, palpitations, paroxysmal nocturnal dyspnea or shortness of breath Dermatological: negative for rash Respiratory: negative for cough or wheezing Urologic: negative for hematuria Abdominal: negative for nausea, vomiting, diarrhea, bright red blood per rectum, melena, or hematemesis Neurologic: negative for visual changes, syncope, or dizziness All other systems reviewed and are otherwise negative except as noted above.    Blood pressure 140/72, pulse 67, height 5\' 9"  (1.753 m), weight 189 lb 12.8 oz (86.1 kg).  General appearance: alert, cooperative and no distress Neck: no carotid bruit and no JVD Lungs: clear to auscultation bilaterally Heart: regular rate and rhythm Abdomen: soft, non-tender; bowel sounds normal; no masses,  no organomegaly Extremities: extremities normal,  atraumatic, no cyanosis or edema Pulses: 2+ and symmetric Skin: Skin color, texture, turgor normal. No rashes or lesions Neurologic: Grossly normal  EKG NSR  ASSESSMENT AND PLAN:   Pre-operative cardiovascular examination Pt seen today for pre op (hand surgery) clearance  Essential hypertension Hyperkalemia with Cozaar- avoid ACE or ARB  CRI (chronic renal insufficiency), stage 3 (moderate) (HCC) CRI 3A  Hyperlipidemia LDL goal <70 Lipitor changed to Crestor _ no change in chronic leg pain   PLAN   Chart reviewed as part of pre-operative protocol coverage. Given past medical history, physical exam and interview today in the office, based on ACC/AHA guidelines, [name] would be at acceptable risk for the planned procedure without further cardiovascular testing.   I will route this recommendation to the requesting party via Epic fax function and remove from pre-op pool.  Please call with questions.  Kerin Ransom, PA-C 06/16/2017, 8:27 AM

## 2017-06-16 NOTE — Assessment & Plan Note (Signed)
Hyperkalemia with Cozaar- avoid ACE or ARB

## 2017-06-16 NOTE — Assessment & Plan Note (Signed)
Lipitor changed to Crestor _ no change in chronic leg pain

## 2017-06-16 NOTE — Assessment & Plan Note (Signed)
Pt seen today for pre op (hand surgery) clearance

## 2017-06-16 NOTE — Assessment & Plan Note (Signed)
CRI 3A

## 2017-07-02 DIAGNOSIS — G5602 Carpal tunnel syndrome, left upper limb: Secondary | ICD-10-CM | POA: Diagnosis not present

## 2017-07-10 ENCOUNTER — Other Ambulatory Visit: Payer: Self-pay | Admitting: Internal Medicine

## 2017-07-14 NOTE — Telephone Encounter (Signed)
Will see if cardiology will take over as Dr. Raliegh Ip will be retiring in Sept.

## 2017-07-16 ENCOUNTER — Telehealth: Payer: Self-pay | Admitting: Internal Medicine

## 2017-07-16 NOTE — Telephone Encounter (Signed)
Copied from [place] [phone]. Topic: Quick Communication - Rx Refill/Question >> Jul 16, 2017  2:36 PM Margot Ables wrote: Medication: oxyCODONE-acetaminophen (PERCOCET) 10-325 MG tablet  - pt is out of medication - takes 2/day - advised to call 3-5 days ahead of time  B12 injection - pt daughter is a Marine scientist and administers for him Has the patient contacted their pharmacy? No - controlled Preferred Pharmacy (with phone number or street name): [place], [place] - [address] [phone] (Phone) [phone] (Fax)

## 2017-07-19 DIAGNOSIS — M79642 Pain in left hand: Secondary | ICD-10-CM | POA: Insufficient documentation

## 2017-07-19 NOTE — Telephone Encounter (Signed)
Patient is aware that this medication can only be refilled at the time of a follow-up office visit

## 2017-07-19 NOTE — Telephone Encounter (Signed)
Pt was notified that he needs to make an appointment for refill on Oxycodone. Pt states he hasnt been making an appointment for this Rx. I advise pt that he was seen 01/2017 for the issue so he has been coming. I advise pt to schedule an OV. Pt was advised that we have an appointment for time for today at 4 and 4:15 and tomorrow earliest is at 1:15pm. Pt declined. Pt asked if he could " Just swing by here and pick it up'' Pt was advised that he needs an OV not to drop in. Pt states that he has an OV at 2:30pm at another doctors office and he will drop by.

## 2017-07-20 ENCOUNTER — Ambulatory Visit (INDEPENDENT_AMBULATORY_CARE_PROVIDER_SITE_OTHER): Payer: Medicare Other | Admitting: Internal Medicine

## 2017-07-20 ENCOUNTER — Encounter: Payer: Self-pay | Admitting: Internal Medicine

## 2017-07-20 VITALS — BP 142/60 | HR 63 | Temp 98.3°F | Wt 191.0 lb

## 2017-07-20 DIAGNOSIS — M545 Low back pain, unspecified: Secondary | ICD-10-CM

## 2017-07-20 DIAGNOSIS — I10 Essential (primary) hypertension: Secondary | ICD-10-CM | POA: Diagnosis not present

## 2017-07-20 DIAGNOSIS — M79604 Pain in right leg: Secondary | ICD-10-CM | POA: Diagnosis not present

## 2017-07-20 DIAGNOSIS — G629 Polyneuropathy, unspecified: Secondary | ICD-10-CM | POA: Diagnosis not present

## 2017-07-20 DIAGNOSIS — M79605 Pain in left leg: Secondary | ICD-10-CM

## 2017-07-20 DIAGNOSIS — G8929 Other chronic pain: Secondary | ICD-10-CM | POA: Diagnosis not present

## 2017-07-20 MED ORDER — OXYCODONE-ACETAMINOPHEN 10-325 MG PO TABS: 1.0000 | ORAL_TABLET | Freq: Two times a day (BID) | ORAL | 0 refills | Status: DC | PRN

## 2017-07-20 NOTE — Progress Notes (Signed)
   Subjective:    Patient ID: [name], male    DOB: August 08, 1940, 77 y.o.   MRN: [id number]  HPI  77 year old patient who is seen today for chronic pain evaluation per opioid protocol.  Indication for chronic opioid: Patient has been on oxycodone a number of years prescribed by his prior physician.  He has been seen by pain management also in the past.  He has a significant painful neuropathy and chronic low back pain secondary to degenerative joint disease. Medication and dose: Oxycodone 10.  He usually takes 1/2 tablet 3-4 times per day # pills per month: Maximum 60/month Last UDS date: October 26, 2016 Opioid Treatment Agreement signed (Y/N): Yes Opioid Treatment Agreement last reviewed with patient:  July 20, 2017 [place] reviewed this encounter (include red flags):  Yes  [place] printed, initialed  and scanned into the EMR.    Review of Systems     Objective:   Physical Exam  Skin:  ulcerated nodular lesion involving the right ear          Assessment & Plan:    Encounter for chronic pain management (G89.29) Narcotic use  (711.90) Pain management contract signed (Z02.89)  Patient will need to establish with a new provider.  He is aware that any new provider may not feel comfortable prescribing oxycodone and he may require referral to a chronic pain management clinic.  Patient made aware that he has been the only patient in my practice that I have been willing to prescribe oxycodone.  Probable BCE right ear.  Patient has seen dermatology in the past (Dr Rosana Hoes).  Patient agreeable to follow-up with dermatology  Marletta Lor

## 2017-07-20 NOTE — Patient Instructions (Addendum)
Please follow-up with your dermatologist due to a suspected basal cell skin cancer involving your right ear  Please establish with a new primary care provider in 3 months  Limit your sodium (Salt) intake  Please check your blood pressure on a regular basis.  If it is consistently greater than 150/90, please make an office appointment.

## 2017-07-27 ENCOUNTER — Other Ambulatory Visit: Payer: Self-pay | Admitting: Internal Medicine

## 2017-07-27 NOTE — Telephone Encounter (Signed)
Okay for refill? Please advise 

## 2017-08-09 ENCOUNTER — Other Ambulatory Visit: Payer: Self-pay | Admitting: Cardiovascular Disease

## 2017-08-28 ENCOUNTER — Other Ambulatory Visit: Payer: Self-pay | Admitting: Internal Medicine

## 2017-08-30 DIAGNOSIS — M5412 Radiculopathy, cervical region: Secondary | ICD-10-CM | POA: Diagnosis not present

## 2017-08-30 DIAGNOSIS — M25562 Pain in left knee: Secondary | ICD-10-CM | POA: Diagnosis not present

## 2017-08-30 DIAGNOSIS — Z981 Arthrodesis status: Secondary | ICD-10-CM | POA: Diagnosis not present

## 2017-08-30 DIAGNOSIS — M542 Cervicalgia: Secondary | ICD-10-CM | POA: Diagnosis not present

## 2017-08-30 NOTE — Telephone Encounter (Signed)
Okay for refill? Please advise 

## 2017-09-06 DIAGNOSIS — M25552 Pain in left hip: Secondary | ICD-10-CM | POA: Diagnosis not present

## 2017-09-06 DIAGNOSIS — M1712 Unilateral primary osteoarthritis, left knee: Secondary | ICD-10-CM | POA: Diagnosis not present

## 2017-09-07 ENCOUNTER — Other Ambulatory Visit: Payer: Self-pay | Admitting: Internal Medicine

## 2017-09-07 DIAGNOSIS — M542 Cervicalgia: Secondary | ICD-10-CM | POA: Diagnosis not present

## 2017-09-14 DIAGNOSIS — M542 Cervicalgia: Secondary | ICD-10-CM | POA: Diagnosis not present

## 2017-10-04 DIAGNOSIS — M5412 Radiculopathy, cervical region: Secondary | ICD-10-CM | POA: Insufficient documentation

## 2017-10-16 ENCOUNTER — Other Ambulatory Visit: Payer: Self-pay | Admitting: Internal Medicine

## 2017-10-18 DIAGNOSIS — M1712 Unilateral primary osteoarthritis, left knee: Secondary | ICD-10-CM | POA: Insufficient documentation

## 2017-10-18 NOTE — Telephone Encounter (Signed)
Left message to return phone call. Rx will be refilled. Pt needs to f/u with new PCP for further refills.

## 2017-12-02 ENCOUNTER — Encounter: Payer: Self-pay | Admitting: *Deleted

## 2017-12-02 ENCOUNTER — Encounter: Payer: Self-pay | Admitting: Gastroenterology

## 2017-12-02 ENCOUNTER — Other Ambulatory Visit: Payer: Self-pay | Admitting: *Deleted

## 2017-12-02 ENCOUNTER — Ambulatory Visit: Payer: Medicare Other | Admitting: Gastroenterology

## 2017-12-02 VITALS — BP 144/70 | HR 72 | Ht 69.0 in | Wt 195.0 lb

## 2017-12-02 DIAGNOSIS — R1310 Dysphagia, unspecified: Secondary | ICD-10-CM | POA: Diagnosis not present

## 2017-12-02 DIAGNOSIS — K2270 Barrett's esophagus without dysplasia: Secondary | ICD-10-CM

## 2017-12-02 DIAGNOSIS — Z1211 Encounter for screening for malignant neoplasm of colon: Secondary | ICD-10-CM

## 2017-12-02 DIAGNOSIS — R109 Unspecified abdominal pain: Secondary | ICD-10-CM

## 2017-12-02 DIAGNOSIS — K219 Gastro-esophageal reflux disease without esophagitis: Secondary | ICD-10-CM

## 2017-12-02 MED ORDER — OMEPRAZOLE 40 MG PO CPDR: 40.0000 mg | DELAYED_RELEASE_CAPSULE | Freq: Every day | ORAL | 6 refills | Status: DC

## 2017-12-02 MED ORDER — NA SULFATE-K SULFATE-MG SULF 17.5-3.13-1.6 GM/177ML PO SOLN: 1.0000 | Freq: Once | ORAL | 0 refills | Status: AC

## 2017-12-02 NOTE — Patient Instructions (Signed)
We have sent the following medications to your pharmacy for you to pick up at your convenience:  Omeprazole  You have been scheduled for a colonoscopy. Please follow written instructions given to you at your visit today.  Please pick up your prep supplies at the pharmacy within the next 1-3 days. If you use inhalers (even only as needed), please bring them with you on the day of your procedure. Your physician has requested that you go to www.startemmi.com and enter the access code given to you at your visit today. This web site gives a general overview about your procedure. However, you should still follow specific instructions given to you by our office regarding your preparation for the procedure.

## 2017-12-02 NOTE — Progress Notes (Signed)
HPI :  77 y/o male previously followed by Dr. Deatra Ina, last seen in 2015, new to me, with a history of Barrett's esophagus, COPD, coronary artery disease, history of bladder cancer, here for evaluation of multiple symptoms.  He reports a long-standing history of GERD. He has a history of short segment Barrett's esophagus. His last EGD was in 2015 remarkable for short segment of Barrett's esophagus as well as a GEJ stenosis which was dilated. He is overdue for a surveillance EGD. He has been taking omeprazole 10 mg once a day. He reports over time that had worked okay for him however more recently he is been having symptoms that bother him. He endorses some pyrosis that is going on, he also has worsening dysphagia in that most thinks he swallowed gets stuck at the bottom of his esophagus. He's been chewing his food very well and eating smaller portions to minimize symptoms.  He also has some occasional pain in his right lower quadrant to right inguinal area. Symptoms can her with a bowel movement. He denies any constipation or diarrhea. He denies any blood in his stools. He denies any heavy lifting. He does report that having a bowel movement will improve some of his pain. His last colonoscopy was 12 years ago. He denies any family history of colon cancer or esophageal cancer.  He denies any cardiopulmonary symptoms. He does not use supplemental oxygen. He had an echocardiogram about a year ago, EF of 66 5%  Endoscopic history: EGD 07/28/2013 - GEJ stenosis, short segment Barrett's esophagus - path c/w nondysplastic Barrett's Colonoscopy 11/17/2005 - normal Colonoscopy 10/2000 - small adenomas  Past Medical History:  Diagnosis Date  . Allergy   . Anemia   . B12 deficiency anemia   . Blood transfusion without reported diagnosis   . CAD (coronary artery disease)   . Cancer ([place])    bladder cancer 8 times  . Colon polyps   . COPD (chronic obstructive pulmonary disease) ([place])   . Depression     . Esophagitis   . Esophagus, Barrett's   . GERD (gastroesophageal reflux disease)   . History of bladder cancer   . Hyperlipidemia   . Hypertension   . Hypothyroidism   . Localized osteoarthrosis, lower leg   . Low back pain   . Malaise and fatigue   . Stenosis of esophagus      Past Surgical History:  Procedure Laterality Date  . bladder cancer    . CERVICAL DISCECTOMY     ACDF  . COLONOSCOPY  11/17/2005   normal   . CORONARY ARTERY BYPASS GRAFT     x4  . ESOPHAGOGASTRODUODENOSCOPY  04/29/2010  . I&D EXTREMITY Left 11/24/2016   Procedure: IRRIGATION AND DEBRIDEMENT LEFT HAND, THUMB, INDEX, MIDDLE, RING, AND SMALL FINGERS WITH RECONSTRUCTION;  Surgeon: Roseanne Kaufman, MD;  Location: [place] [facility] [place] [facility];  Service: Orthopedics;  Laterality: Left;  . KNEE ARTHROSCOPY    . LUMBAR LAMINECTOMY     and fusion  . POPLITEAL SYNOVIAL CYST EXCISION     Family History  Problem Relation Age of Onset  . Cancer Mother        melanoma  . Stroke Father   . Hypertension Father   . Coronary artery disease Unknown   . Colon cancer Neg Hx   . Esophageal cancer Neg Hx   . Stomach cancer Neg Hx   . Rectal cancer Neg Hx    Social History   Tobacco Use  . Smoking status: Former  Smoker    Last attempt to quit: 03/30/1976    Years since quitting: 41.7  . Smokeless tobacco: Never Used  . Tobacco comment: quit in 1978  Substance Use Topics  . Alcohol use: No    Alcohol/week: 0.0 standard drinks  . Drug use: No   Current Outpatient Medications  Medication Sig Dispense Refill  . aspirin 81 MG tablet Take 81 mg by mouth daily.      [name] BYSTOLIC 10 MG tablet TAKE ONE TABLET BY MOUTH ONCE DAILY 45 tablet 7  . cyanocobalamin (,VITAMIN B-12,) 1000 MCG/ML injection INJECT 1 ML (CC) INTRAMUSCULARLY FOR 1 DOSE 6 mL 3  . Cyanocobalamin (B-12 COMPLIANCE INJECTION IJ) Inject 500 mg as directed every 30 (thirty) days.    [name] etodolac (LODINE) 300 MG capsule TAKE 1 CAPSULE BY MOUTH EVERY 8 HOURS 180 capsule 1  .  ezetimibe (ZETIA) 10 MG tablet TAKE 1 TABLET BY MOUTH ONCE DAILY 90 tablet 2  . folic acid (FOLVITE) 696 MCG tablet Take 400 mcg by mouth daily.      [name] gabapentin (NEURONTIN) 600 MG tablet Take 1 tablet (600 mg total) by mouth 3 (three) times daily. 90 tablet 1  . isosorbide mononitrate (IMDUR) 60 MG 24 hr tablet TAKE 1 TABLET BY MOUTH ONCE DAILY 90 tablet 3  . NEEDLE, DISP, 25 G (B-D DISP NEEDLE 25GX1") 25G X 1" MISC Inject 1000 mcg into muscle once a month. 50 each 0  . nitroGLYCERIN (NITROSTAT) 0.4 MG SL tablet Place 1 tablet (0.4 mg total) under the tongue every 5 (five) minutes as needed for chest pain. 30 tablet 3  . omeprazole (PRILOSEC) 10 MG capsule Take 10 mg by mouth daily.    [name] oxyCODONE-acetaminophen (PERCOCET) 10-325 MG tablet Take 1 tablet by mouth 2 (two) times daily as needed. 60 tablet 0  . rOPINIRole (REQUIP) 2 MG tablet TAKE 1 TABLET BY MOUTH AT BEDTIME 90 tablet 1  . rosuvastatin (CRESTOR) 20 MG tablet TAKE 1 TABLET BY MOUTH DAILY 90 tablet 1  . SYRINGE-NEEDLE, DISP, 3 ML (BD ECLIPSE SYRINGE) 25G X 1" 3 ML MISC Use to inject 1000 mcg into muscle once a month. 50 each 0  . venlafaxine (EFFEXOR) 75 MG tablet TAKE ONE TABLET BY MOUTH ONCE DAILY 90 tablet 3   No current facility-administered medications for this visit.    Allergies  Allergen Reactions  . Losartan Potassium Other (See Comments)    Hyperkalemia  . Tape Rash    Itching, reddened skin Itching, reddened skin     Review of Systems: All systems reviewed and negative except where noted in HPI.   Lab Results  Component Value Date   WBC 9.1 12/07/2016   HGB 11.8 (L) 12/07/2016   HCT 36.4 (L) 12/07/2016   MCV 90 12/07/2016   PLT 228 12/07/2016    Lab Results  Component Value Date   CREATININE 1.29 (H) 12/23/2016   BUN 23 12/23/2016   NA 144 12/23/2016   K 5.0 12/23/2016   CL 106 12/23/2016   CO2 26 12/23/2016    Lab Results  Component Value Date   ALT 11 12/16/2016   AST 18 12/16/2016    ALKPHOS 78 12/16/2016   BILITOT 0.4 12/16/2016     Physical Exam: BP (!) 144/70   Pulse 72   Ht 5\' 9"  (1.753 m)   Wt 195 lb (88.5 kg)   BMI 28.80 kg/m  Constitutional: Pleasant,well-developed, male in no acute distress. HEENT: Normocephalic and atraumatic. Conjunctivae  are normal. No scleral icterus. Neck supple.  Cardiovascular: Normal rate, regular rhythm.  Pulmonary/chest: Effort normal and breath sounds normal. No wheezing, rales or rhonchi. Abdominal: Soft, nondistended, nontender.  There are no masses palpable. No hepatomegaly. Extremities: no edema Lymphadenopathy: No cervical adenopathy noted. Neurological: Alert and oriented to person place and time. Skin: Skin is warm and dry. No rashes noted. Psychiatric: Normal mood and affect. Behavior is normal.   ASSESSMENT AND PLAN: 77 year old male here for a new patient visit with me to discuss the following issues:  Barrett esophagus / dysphagia / GERD - worsening symptoms while on omeprazole 10 mg a day. Recommend he increase to 40 mg once daily to see if this can reduce his symptoms. Otherwise he is overdue for surveillance exam and in light of his dysphagia I'm recommending he have an endoscopy in the near future. I discussed risks and benefits of endoscopy and possible dilation with him and he wanted to proceed. Further recommendations pending the results.  Abdominal pain / colon cancer screening - there is some relation of his abdominal pain to his bowel movements. He is overdue for a colonoscopy. Recommend colonoscopy initially to further evaluate. I discussed risk benefits of colonoscopy and anesthesia and he wanted to proceed. Further recommendations pending the result.  Wendell Cellar, MD [facility]  CC: Marletta Lor, MD

## 2017-12-13 ENCOUNTER — Encounter: Payer: Self-pay | Admitting: Cardiovascular Disease

## 2017-12-13 ENCOUNTER — Encounter: Payer: Self-pay | Admitting: *Deleted

## 2017-12-13 ENCOUNTER — Telehealth: Payer: Self-pay

## 2017-12-13 ENCOUNTER — Ambulatory Visit: Payer: Medicare Other | Admitting: Cardiovascular Disease

## 2017-12-13 VITALS — BP 139/70 | HR 64 | Ht 69.0 in | Wt 191.2 lb

## 2017-12-13 DIAGNOSIS — I10 Essential (primary) hypertension: Secondary | ICD-10-CM | POA: Diagnosis not present

## 2017-12-13 DIAGNOSIS — G2581 Restless legs syndrome: Secondary | ICD-10-CM

## 2017-12-13 DIAGNOSIS — Z951 Presence of aortocoronary bypass graft: Secondary | ICD-10-CM | POA: Diagnosis not present

## 2017-12-13 DIAGNOSIS — N183 Chronic kidney disease, stage 3 unspecified: Secondary | ICD-10-CM

## 2017-12-13 DIAGNOSIS — N2889 Other specified disorders of kidney and ureter: Secondary | ICD-10-CM

## 2017-12-13 DIAGNOSIS — I519 Heart disease, unspecified: Secondary | ICD-10-CM | POA: Diagnosis not present

## 2017-12-13 DIAGNOSIS — Z79899 Other long term (current) drug therapy: Secondary | ICD-10-CM

## 2017-12-13 DIAGNOSIS — E785 Hyperlipidemia, unspecified: Secondary | ICD-10-CM

## 2017-12-13 DIAGNOSIS — R0600 Dyspnea, unspecified: Secondary | ICD-10-CM

## 2017-12-13 DIAGNOSIS — I5189 Other ill-defined heart diseases: Secondary | ICD-10-CM

## 2017-12-13 DIAGNOSIS — I2510 Atherosclerotic heart disease of native coronary artery without angina pectoris: Secondary | ICD-10-CM | POA: Diagnosis not present

## 2017-12-13 DIAGNOSIS — R0609 Other forms of dyspnea: Secondary | ICD-10-CM | POA: Diagnosis not present

## 2017-12-13 MED ORDER — HYDROCHLOROTHIAZIDE 12.5 MG PO CAPS: 12.5000 mg | ORAL_CAPSULE | Freq: Every day | ORAL | 3 refills | Status: DC

## 2017-12-13 NOTE — Telephone Encounter (Signed)
I would be happy to see him, however oxycodone is not a narcotic that I feel comfortable prescribing. Please make patient aware. If he decides to stay with me, will need referral to pain management clinic or we can discuss alternative pain management options.

## 2017-12-13 NOTE — Progress Notes (Signed)
Patient ID: [name], male   DOB: [birth year], 77 y.o.   MRN: [id number]      HPI: [name] is a 77 y.o. male who presents to the office today for a 78 month cardiology followup evaluation.  [name] has known CAD and underwent CABG revascularization surgery in 2001 with a LIMA to his LAD, vein to the marginal, vein to the RCA. Catheterization in 2004 showed patent grafts. An echo Doppler study in August 2012 revealed an ejection fraction of 40-45% with moderate septal hypokinesis with grade 1 diastolic dysfunction as well as aortic valve sclerosis. Additional problems include GERD, mild emphysema, as well as obstructive sleep apnea. He no longer utilizes CPAP. He has lost approximately 25 pounds since his wife's death from a motor vehicle accident in January 2013.  He remains active and notes mild shortness of breath with activity particularly when walking up hills but this has not changed.  He also is a history of restless leg syndrome and has benefited with requip. Laboratory in 2013 demonstrated LDL 57 with LDL particle #1035, small LDL particle number was 611, slightly increased, HDLC was 35 triglycerides 93 total cholesterol 111 initial resistance course slightly elevated at 53.  BP blood work in March 2015 revealed normal renal function.  Fasting glucose was minimally increased at 106.  Total cholesterol was 116 triglycerides 80, and LDL 66, with an HDL of 34.  LDL particle number was slightly increased at 1190.  He undewent a five-year follow-up nuclear perfusion study in November 2016.  Ejection fraction was 54%.  He had normal perfusion.  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  He admits to arthralgias without myalgias.  He has issues with his in his legs and his peripheral neuropathy.  I further titrated his losartan to 75 mg and Bystolic to 10 mg.  He believes his blood pressure and pulse have been better.   He admits to leg discomfort which seems worse with atorvastatin.  When I last saw him, he had noticed some shortness of breath with uphill walking and with exertion but denied recurrent episodes of chest tightness.   He denied PND, orthopnea.  He underwent a follow-up echo Doppler study on 10/28/2016.  Ejection fraction was excellent at 60-65%.  There was grade 2 diastolic dysfunction.  There was evidence for aortic sclerosis without stenosis.  There was mild LA dilation.  He had trivial PR.    I last saw him in October 2018.  Over the last year, he underwent left hand surgery.  His primary care with [name] is retired.  Recently, he has noticed progressive increase in shortness of breath and this seems to develop even trying to walk up very small hill.  He denies any definitive chest tightness.  He has started to run out of some of his chronic pain meds which had been prescribed by [name].  He has not yet reestablished with another primary physician.  He continues to take Bystolic 10 mg and isosorbide 60 mg for his CAD and hypertension.  He is on Zetia 10 mg and rosuvastatin for hyperlipidemia.  Continues to be on chronic aspirin therapy.  He has restless legs and is on Requip.  His previous primary physician had prescribed Effexor in addition to Percocet.  He presents for evaluation.  Past Medical History:  Diagnosis Date  . Allergy   . Anemia   . B12 deficiency anemia   .  Blood transfusion without reported diagnosis   . CAD (coronary artery disease)   . Cancer ([place])    bladder cancer 8 times  . Colon polyps   . COPD (chronic obstructive pulmonary disease) ([place])   . Depression   . Esophagitis   . Esophagus, Barrett's   . GERD (gastroesophageal reflux disease)   . History of bladder cancer   . Hyperlipidemia   . Hypertension   . Hypothyroidism   . Localized osteoarthrosis, lower leg   . Low back pain   . Malaise and fatigue   . Stenosis of esophagus     Past Surgical  History:  Procedure Laterality Date  . bladder cancer    . CERVICAL DISCECTOMY     ACDF  . COLONOSCOPY  11/17/2005   normal   . CORONARY ARTERY BYPASS GRAFT     x4  . ESOPHAGOGASTRODUODENOSCOPY  04/29/2010  . I&D EXTREMITY Left 11/24/2016   Procedure: IRRIGATION AND DEBRIDEMENT LEFT HAND, THUMB, INDEX, MIDDLE, RING, AND SMALL FINGERS WITH RECONSTRUCTION;  Surgeon: Roseanne Kaufman, MD;  Location: [facility];  Service: Orthopedics;  Laterality: Left;  . KNEE ARTHROSCOPY    . LUMBAR LAMINECTOMY     and fusion  . POPLITEAL SYNOVIAL CYST EXCISION      Allergies  Allergen Reactions  . Losartan Potassium Other (See Comments)    Hyperkalemia  . Tape Rash    Itching, reddened skin Itching, reddened skin    Current Outpatient Medications  Medication Sig Dispense Refill  . aspirin 81 MG tablet Take 81 mg by mouth daily.      [name] BYSTOLIC 10 MG tablet TAKE ONE TABLET BY MOUTH ONCE DAILY 45 tablet 7  . cyanocobalamin (,VITAMIN B-12,) 1000 MCG/ML injection INJECT 1 ML (CC) INTRAMUSCULARLY FOR 1 DOSE 6 mL 3  . etodolac (LODINE) 300 MG capsule TAKE 1 CAPSULE BY MOUTH EVERY 8 HOURS 180 capsule 1  . ezetimibe (ZETIA) 10 MG tablet TAKE 1 TABLET BY MOUTH ONCE DAILY 90 tablet 2  . folic acid (FOLVITE) 287 MCG tablet Take 400 mcg by mouth daily.      [name] gabapentin (NEURONTIN) 600 MG tablet Take 1 tablet (600 mg total) by mouth 3 (three) times daily. 90 tablet 1  . isosorbide mononitrate (IMDUR) 60 MG 24 hr tablet TAKE 1 TABLET BY MOUTH ONCE DAILY 90 tablet 3  . NEEDLE, DISP, 25 G (B-D DISP NEEDLE 25GX1") 25G X 1" MISC Inject 1000 mcg into muscle once a month. 50 each 0  . nitroGLYCERIN (NITROSTAT) 0.4 MG SL tablet Place 1 tablet (0.4 mg total) under the tongue every 5 (five) minutes as needed for chest pain. 30 tablet 3  . omeprazole (PRILOSEC) 10 MG capsule Take 10 mg by mouth daily.    [name] omeprazole (PRILOSEC) 40 MG capsule Take 1 capsule (40 mg total) by mouth daily. 30 capsule 6  .  oxyCODONE-acetaminophen (PERCOCET) 10-325 MG tablet Take 1 tablet by mouth 2 (two) times daily as needed. 60 tablet 0  . rOPINIRole (REQUIP) 2 MG tablet TAKE 1 TABLET BY MOUTH AT BEDTIME 90 tablet 1  . rosuvastatin (CRESTOR) 20 MG tablet TAKE 1 TABLET BY MOUTH DAILY 90 tablet 1  . SYRINGE-NEEDLE, DISP, 3 ML (BD ECLIPSE SYRINGE) 25G X 1" 3 ML MISC Use to inject 1000 mcg into muscle once a month. 50 each 0  . hydrochlorothiazide (MICROZIDE) 12.5 MG capsule Take 1 capsule (12.5 mg total) by mouth daily. 90 capsule 3  . venlafaxine (EFFEXOR) 75 MG tablet  Take 1 tablet (75 mg total) by mouth daily. 10 tablet 0   No current facility-administered medications for this visit.    Socially he is widowed in January 2013. He has 3 children 6 grandchildren. 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 There is no recent tobacco use.  no alcohol  ROS General: Negative; No fevers, chills, or night sweats;  HEENT: Negative; No changes in vision or hearing, sinus congestion, difficulty swallowing Pulmonary: Negative; No cough, wheezing, shortness of breath, hemoptysis Cardiovascular: Negative; No chest pain, presyncope, syncope, palpatations.  Minimal shortness of breath with activity GI: GERD; No nausea, vomiting, diarrhea, or abdominal pain GU: Negative; No dysuria, hematuria, or difficulty voiding Musculoskeletal: Positive for multiple joint aches involving his legs, hips, back, and shoulder Hematologic/Oncology: Negative; no easy bruising, bleeding Endocrine: Negative; no heat/cold intolerance; no diabetes Neuro: Negative; no changes in balance, headaches Skin: Negative; No rashes or skin lesions Psychiatric: Negative; No behavioral problems, depression Sleep: He no longer uses CPAP with his 25 pound weight loss.  His restless legs improved with low-dose Requip; No snoring, daytime sleepiness, hypersomnolence, bruxism,  hypnogognic hallucinations, no cataplexy Other comprehensive 14 point system review is negative.   PE BP 139/70   Pulse 64   Ht '5\' 9"'$  (1.753 m)   Wt 191 lb 3.2 oz (86.7 kg)   BMI 28.24 kg/m    Repeat blood pressure by me was 140/72  Wt Readings from Last 3 Encounters:  12/13/17 191 lb 3.2 oz (86.7 kg)  12/02/17 195 lb (88.5 kg)  07/20/17 191 lb (86.6 kg)   General: Alert, oriented, no distress.  Skin: normal turgor, no rashes, warm and dry HEENT: Normocephalic, atraumatic. Pupils equal round and reactive to light; sclera anicteric; extraocular muscles intact;  Nose without nasal septal hypertrophy Mouth/Parynx benign; Mallinpatti scale 3 Neck: No JVD, no carotid bruits; normal carotid upstroke Lungs: clear to ausculatation and percussion; no wheezing or rales Chest wall: without tenderness to palpitation Heart: PMI not displaced, RRR, s1 s2 normal, 1/6 systolic murmur, no diastolic murmur, no rubs, gallops, thrills, or heaves Abdomen: soft, nontender; no hepatosplenomehaly, BS+; abdominal aorta nontender and not dilated by palpation. Back: no CVA tenderness Pulses 2+ Musculoskeletal: full range of motion, normal strength, no joint deformities Extremities: no clubbing cyanosis or edema, Homan's sign negative  Neurologic: grossly nonfocal; Cranial nerves grossly wnl Psychologic: Normal mood and affect  ECG (independently read by me): NSR at 64; IRBBB; normal intervals  July 2018 ECG (independently read by me): Sinus bradycardia 53 bpm.  No ectopy.  Normal intervals.  No ST segment changes.  December 2017 ECG (independently read by me): Normal sinus rhythm at 74 bpm.  No ectopy.  Normal intervals.  No ST segment changes.  October 2016 ECG (independently read by me): Normal sinus rhythm at 61 bpm.  No ectopy.  Normal intervals.  May 2016 ECG (independently read by me): Sinus bradycardia 56 bpm.  No ectopy.  Mild RV conduction delay.  QTc interval 395  ECG (independently read  by me): sinus bradycardia 56 bpm.  Mild RV conduction delay.  Borderline first-degree AV block with a PR interval at 208 ms.  No ectopy.  Prior 06/05/2013 ECG (independently read by me): Normal sinus rhythm at 63 beats per minute.  No ectopy; normal intervals.  Prior November 2014 ECG: Sinus rhythm at 68beats per minute. There is mild RV conduction delay. Intervals were  normal.  LABS:  BMP Latest Ref Rng & Units 12/23/2016 12/16/2016 12/09/2016  Glucose 65 - 99 mg/dL 95 103(H) 207(H)  BUN 8 - 27 mg/dL 23 23 40(H)  Creatinine 0.76 - 1.27 mg/dL 1.29(H) 1.36(H) 2.15(H)  BUN/Creat Ratio 10 - '24 18 17 19  '$ Sodium 134 - 144 mmol/L 144 144 143  Potassium 3.5 - 5.2 mmol/L 5.0 5.9(H) 5.4(H)  Chloride 96 - 106 mmol/L 106 106 106  CO2 20 - 29 mmol/L '26 24 22  '$ Calcium 8.6 - 10.2 mg/dL 8.9 9.2 9.1   Hepatic Function Latest Ref Rng & Units 12/16/2016 12/07/2016 12/26/2015  Total Protein 6.0 - 8.5 g/dL 7.3 7.7 7.2  Albumin 3.5 - 4.8 g/dL 4.1 4.4 4.1  AST 0 - 40 IU/L '18 19 21  '$ ALT 0 - 44 IU/L '11 16 12  '$ Alk Phosphatase 39 - 117 IU/L 78 92 82  Total Bilirubin 0.0 - 1.2 mg/dL 0.4 0.5 0.9  Bilirubin, Direct 0.0 - 0.3 mg/dL - - -    CBC Latest Ref Rng & Units 12/07/2016 11/24/2016 12/26/2015  WBC 3.4 - 10.8 x10E3/uL 9.1 5.9 6.7  Hemoglobin 13.0 - 17.7 g/dL 11.8(L) 11.0(L) 14.2  Hematocrit 37.5 - 51.0 % 36.4(L) 33.7(L) 42.5  Platelets 150 - 379 x10E3/uL 228 137(L) 156     Lipid Panel     Component Value Date/Time   CHOL 100 12/16/2016 1353   CHOL 116 03/30/2013 1053   TRIG 142 12/16/2016 1353   TRIG 80 03/30/2013 1053   HDL 27 (L) 12/16/2016 1353   HDL 34 (L) 03/30/2013 1053   CHOLHDL 3.7 12/16/2016 1353   CHOLHDL 5.5 (H) 12/26/2015 1034   VLDL 28 12/26/2015 1034   LDLCALC 45 12/16/2016 1353   LDLCALC 66 03/30/2013 1053     RADIOLOGY: No results found.  IMPRESSION:  1. Coronary artery disease involving native coronary artery of native heart without angina pectoris   2. Hx of CABG     3. Exertional dyspnea   4. Grade II diastolic dysfunction   5. Essential hypertension   6. Hyperlipidemia LDL goal <70   7. Medication management   8. CRI (chronic renal insufficiency), stage 3 (moderate) (HCC)   9. Restless legs syndrome     ASSESSMENT AND PLAN:  Mr. Zingaro is a 77 year old Caucasian male who is 18 years status post his CABG revascularization surgery in 2001. His last catheterization in 2004 for recurrent chest pain continued to show patent grafts. His ejection fraction of 40-45% was documented in August 2012.  His most recent echo in October 2018 showed an EF of 60 to 65% with grade 2 diastolic dysfunction, aortic valve sclerosis without stenosis mild left atrial dilation.  Since I last saw him, unfortunately he had stained left hand injury with a cutting saw required surgery by Dr. Amedeo Plenty.  He was seen in May for preoperative assessment and at that time was stable.  Recently he has noticed a significant decline in his ability to do any activity without development of shortness of breath.  Because of the change in symptomatology I will obtain a one-year follow-up echo.  I am also scheduling him for follow-up [place] study to assess for potential ischemic etiologies.  I am obtaining fasting laboratory.  I was going to start him on spironolactone, but in the past he had had issues with elevated potassium on ARB therapy.  For this reason I will start HCTZ 12.5 mg instead.  I have recommended that he follow-up  with his previous [place] office and have suggested possible burst Dr. Elease Hashimoto for his primary evaluation.  Otherwise I have suggested a referral to [place].  He continues to be on Zetia and rosuvastatin for hyperlipidemia.  When last checked in November 2018 his LDL was excellent at 45.  We will see him in 3 months for follow-up evaluation or sooner if problem arise.  Time spent: 30 minutes  Troy Sine, MD, [facility]  12/15/2017 6:39 PM

## 2017-12-13 NOTE — Telephone Encounter (Signed)
Pt came in to office to ask for refills on medications and to schedule TOC visit with Jerilee Hoh. Visit has been scheduled for 12/23/17.  Rx refills: B12 Oxycodone Effexor  [facility]

## 2017-12-13 NOTE — Patient Instructions (Addendum)
Medication Instructions:  START HCTZ 12.5 mg daily  If you need a refill on your cardiac medications before your next appointment, please call your pharmacy.   Lab work: Please return for FASTING labs in 1-2 weeks (CMET, CBC, Lipid, TSH)  Our in office lab hours are Monday-Friday 8:00-4:00, closed for lunch 12:45-1:45 pm.  No appointment needed.  If you have labs (blood work) drawn today and your tests are completely normal, you will receive your results only by: [name] MyChart Message (if you have MyChart) OR . A paper copy in the mail If you have any lab test that is abnormal or we need to change your treatment, we will call you to review the results.  Testing/Procedures: Your physician has requested that you have an echocardiogram. Echocardiography is a painless test that uses sound waves to create images of your heart. It provides your doctor with information about the size and shape of your heart and how well your heart's chambers and valves are working. This procedure takes approximately one hour. There are no restrictions for this procedure.  Your physician has requested that you have a lexiscan myoview. For further information please visit [email or web address]. Please follow instruction sheet, as given.  Follow-Up: At [facility], you and your health needs are our priority.  As part of our continuing mission to provide you with exceptional heart care, we have created designated Provider Care Teams.  These Care Teams include your primary Cardiologist (physician) and Advanced Practice Providers (APPs -  Physician Assistants and Nurse Practitioners) who all work together to provide you with the care you need, when you need it. You will need a follow up appointment in 3 months.  Please call our office 2 months in advance to schedule this appointment.  You may see [name], MD or one of the following Advanced Practice Providers on your designated Care Team: [place]ra [place], [place] . Fabian Sharp,  PA-C    **Call [facility] at [facility] to see if you can establish with a new provider

## 2017-12-14 NOTE — Telephone Encounter (Signed)
Left message on machine for patient to return our call CRM 

## 2017-12-15 ENCOUNTER — Other Ambulatory Visit: Payer: Self-pay | Admitting: Internal Medicine

## 2017-12-15 ENCOUNTER — Encounter: Payer: Self-pay | Admitting: Cardiovascular Disease

## 2017-12-15 ENCOUNTER — Telehealth: Payer: Self-pay | Admitting: Internal Medicine

## 2017-12-15 ENCOUNTER — Encounter: Payer: Self-pay | Admitting: Gastroenterology

## 2017-12-15 DIAGNOSIS — E038 Other specified hypothyroidism: Secondary | ICD-10-CM

## 2017-12-15 MED ORDER — VENLAFAXINE HCL 75 MG PO TABS: 75.0000 mg | ORAL_TABLET | Freq: Every day | ORAL | 0 refills | Status: DC

## 2017-12-15 NOTE — Telephone Encounter (Signed)
Okay for refill? Please advise 

## 2017-12-15 NOTE — Telephone Encounter (Signed)
Okay to refill? 

## 2017-12-15 NOTE — Telephone Encounter (Signed)
Copied from [place] [id number]. Topic: Quick Communication - Rx Refill/Question >> Dec 15, 2017  9:52 AM Cecelia Byars, NT wrote: Medication:  venlafaxine [facility]) 75 MG table, patient has been out of medication for 1 week ,has an appointment with Dr Jerilee Hoh on 12/23/17  Has the patient contacted their pharmacy? yes  (Agent: If no, request that the patient contact the pharmacy for the refill. (Agent: If yes, when and what did the pharmacy advise?  Preferred Pharmacy (with phone number or street name  [facility] [address] [place] - [place] [address] [phone] (Phone) [phone] (Fax    Agent: Please be advised that RX refills may take up to 3 business days. We ask that you follow-up with your pharmacy.

## 2017-12-15 NOTE — Telephone Encounter (Signed)
Please see note below / Patient has been out of medication x1 week.  Appointment to establish with Dr. Jerilee Hoh on 12/23/17 /

## 2017-12-23 ENCOUNTER — Ambulatory Visit (HOSPITAL_COMMUNITY): Payer: Medicare Other | Attending: Cardiovascular Disease

## 2017-12-23 ENCOUNTER — Encounter: Payer: Medicare Other | Admitting: Internal Medicine

## 2017-12-23 ENCOUNTER — Other Ambulatory Visit: Payer: Self-pay

## 2017-12-23 DIAGNOSIS — I10 Essential (primary) hypertension: Secondary | ICD-10-CM

## 2017-12-23 DIAGNOSIS — I2510 Atherosclerotic heart disease of native coronary artery without angina pectoris: Secondary | ICD-10-CM

## 2017-12-23 DIAGNOSIS — Z951 Presence of aortocoronary bypass graft: Secondary | ICD-10-CM | POA: Diagnosis not present

## 2017-12-24 ENCOUNTER — Encounter: Payer: Self-pay | Admitting: Internal Medicine

## 2017-12-24 ENCOUNTER — Ambulatory Visit (INDEPENDENT_AMBULATORY_CARE_PROVIDER_SITE_OTHER): Payer: Medicare Other | Admitting: Internal Medicine

## 2017-12-24 VITALS — BP 140/80 | HR 62 | Temp 98.3°F | Ht 69.0 in | Wt 191.3 lb

## 2017-12-24 DIAGNOSIS — Z79899 Other long term (current) drug therapy: Secondary | ICD-10-CM | POA: Diagnosis not present

## 2017-12-24 DIAGNOSIS — I10 Essential (primary) hypertension: Secondary | ICD-10-CM | POA: Diagnosis not present

## 2017-12-24 DIAGNOSIS — G8929 Other chronic pain: Secondary | ICD-10-CM

## 2017-12-24 DIAGNOSIS — M5412 Radiculopathy, cervical region: Secondary | ICD-10-CM

## 2017-12-24 DIAGNOSIS — E038 Other specified hypothyroidism: Secondary | ICD-10-CM | POA: Diagnosis not present

## 2017-12-24 DIAGNOSIS — I2510 Atherosclerotic heart disease of native coronary artery without angina pectoris: Secondary | ICD-10-CM

## 2017-12-24 DIAGNOSIS — Z5181 Encounter for therapeutic drug level monitoring: Secondary | ICD-10-CM

## 2017-12-24 DIAGNOSIS — N183 Chronic kidney disease, stage 3 unspecified: Secondary | ICD-10-CM

## 2017-12-24 DIAGNOSIS — R0602 Shortness of breath: Secondary | ICD-10-CM

## 2017-12-24 DIAGNOSIS — E785 Hyperlipidemia, unspecified: Secondary | ICD-10-CM | POA: Diagnosis not present

## 2017-12-24 DIAGNOSIS — E538 Deficiency of other specified B group vitamins: Secondary | ICD-10-CM

## 2017-12-24 DIAGNOSIS — M545 Low back pain: Secondary | ICD-10-CM

## 2017-12-24 DIAGNOSIS — Z951 Presence of aortocoronary bypass graft: Secondary | ICD-10-CM | POA: Diagnosis not present

## 2017-12-24 MED ORDER — VENLAFAXINE HCL 75 MG PO TABS: 75.0000 mg | ORAL_TABLET | Freq: Every day | ORAL | 0 refills | Status: DC

## 2017-12-24 MED ORDER — OXYCODONE-ACETAMINOPHEN 10-325 MG PO TABS: 1.0000 | ORAL_TABLET | Freq: Two times a day (BID) | ORAL | 0 refills | Status: DC | PRN

## 2017-12-24 MED ORDER — "NEEDLE (DISP) 25G X 1"" MISC": 0 refills | Status: DC

## 2017-12-24 MED ORDER — CYANOCOBALAMIN 1000 MCG/ML IJ SOLN: INTRAMUSCULAR | 3 refills | Status: DC

## 2017-12-24 NOTE — Patient Instructions (Signed)
-  It was nice meeting you today!  -Medication refills have been sent to [facility].  -Please follow-up as scheduled for your stress test later this month.  -Please schedule your annual Medicare wellness visit with our wellness coach.  Please come fasting to that visit.  -I will see you back in follow-up in 3 months per opioid management protocol.

## 2017-12-24 NOTE — Progress Notes (Signed)
Established Patient Office Visit     CC/Reason for Visit: Establish care, medication refills, follow-up chronic medical conditions  HPI: [name] is a 77 y.o. male who is coming in today for the above mentioned reasons.  He is past due for his annual wellness visit.  Past Medical History is significant for: Coronary artery disease followed by Dr. Claiborne Billings with recent increase in dyspnea on exertion schedule for stress test later this month, B12 deficiency on monthly supplementation, history of bladder cancer that has been stable, depression stable on Effexor, hypertension that has been uncontrolled with recent titration and hydrochlorothiazide by cardiology, hyperlipidemia on Crestor.  He is coming in today to establish care with me but mainly for medication refill as per the opioid management protocol.  He has been maintained on oxycodone ever since [name] was in practice in this office, this was then taken over by Dr. Raliegh Ip.  He takes this for chronic back pain, chronic left knee pain and for residual pain following his bladder cancer.   Past Medical/Surgical History: Past Medical History:  Diagnosis Date  . Allergy   . Anemia   . B12 deficiency anemia   . Blood transfusion without reported diagnosis   . CAD (coronary artery disease)   . Cancer ([place])    bladder cancer 8 times  . Colon polyps   . COPD (chronic obstructive pulmonary disease) ([place])   . Depression   . Esophagitis   . Esophagus, Barrett's   . GERD (gastroesophageal reflux disease)   . History of bladder cancer   . Hyperlipidemia   . Hypertension   . Hypothyroidism   . Localized osteoarthrosis, lower leg   . Low back pain   . Malaise and fatigue   . Stenosis of esophagus     Past Surgical History:  Procedure Laterality Date  . bladder cancer    . CERVICAL DISCECTOMY     ACDF  . COLONOSCOPY  11/17/2005   normal   . CORONARY ARTERY BYPASS GRAFT     x4  . ESOPHAGOGASTRODUODENOSCOPY  04/29/2010  .  I&D EXTREMITY Left 11/24/2016   Procedure: IRRIGATION AND DEBRIDEMENT LEFT HAND, THUMB, INDEX, MIDDLE, RING, AND SMALL FINGERS WITH RECONSTRUCTION;  Surgeon: Roseanne Kaufman, MD;  Location: [facility];  Service: Orthopedics;  Laterality: Left;  . KNEE ARTHROSCOPY    . LUMBAR LAMINECTOMY     and fusion  . POPLITEAL SYNOVIAL CYST EXCISION      Social History:  reports that he quit smoking about 41 years ago. He has never used smokeless tobacco. He reports that he does not drink alcohol or use drugs.  Allergies: Allergies  Allergen Reactions  . Losartan Potassium Other (See Comments)    Hyperkalemia  . Tape Rash    Itching, reddened skin Itching, reddened skin    Family History:  Family History  Problem Relation Age of Onset  . Melanoma Mother   . Stroke Father   . Hypertension Father   . Coronary artery disease Unknown   . Colon cancer Neg Hx   . Esophageal cancer Neg Hx   . Stomach cancer Neg Hx   . Rectal cancer Neg Hx      Current Outpatient Medications:  .  aspirin 81 MG tablet, Take 81 mg by mouth daily.  , Disp: , Rfl:  .  BYSTOLIC 10 MG tablet, TAKE ONE TABLET BY MOUTH ONCE DAILY, Disp: 45 tablet, Rfl: 7 .  cyanocobalamin (,VITAMIN B-12,) 1000 MCG/ML injection, INJECT  1 ML (CC) INTRAMUSCULARLY FOR 1 DOSE, Disp: 6 mL, Rfl: 3 .  etodolac (LODINE) 300 MG capsule, TAKE 1 CAPSULE BY MOUTH EVERY 8 HOURS, Disp: 180 capsule, Rfl: 1 .  ezetimibe (ZETIA) 10 MG tablet, TAKE 1 TABLET BY MOUTH ONCE DAILY, Disp: 90 tablet, Rfl: 2 .  folic acid (FOLVITE) 237 MCG tablet, Take 400 mcg by mouth daily.  , Disp: , Rfl:  .  gabapentin (NEURONTIN) 600 MG tablet, Take 1 tablet (600 mg total) by mouth 3 (three) times daily., Disp: 90 tablet, Rfl: 1 .  hydrochlorothiazide (MICROZIDE) 12.5 MG capsule, Take 1 capsule (12.5 mg total) by mouth daily., Disp: 90 capsule, Rfl: 3 .  isosorbide mononitrate (IMDUR) 60 MG 24 hr tablet, TAKE 1 TABLET BY MOUTH ONCE DAILY, Disp: 90 tablet, Rfl: 3 .  NEEDLE,  DISP, 25 G (B-D DISP NEEDLE 25GX1") 25G X 1" MISC, Inject 1000 mcg into muscle once a month., Disp: 50 each, Rfl: 0 .  nitroGLYCERIN (NITROSTAT) 0.4 MG SL tablet, Place 1 tablet (0.4 mg total) under the tongue every 5 (five) minutes as needed for chest pain., Disp: 30 tablet, Rfl: 3 .  omeprazole (PRILOSEC) 40 MG capsule, Take 1 capsule (40 mg total) by mouth daily., Disp: 30 capsule, Rfl: 6 .  oxyCODONE-acetaminophen (PERCOCET) 10-325 MG tablet, Take 1 tablet by mouth 2 (two) times daily as needed., Disp: 60 tablet, Rfl: 0 .  rOPINIRole (REQUIP) 2 MG tablet, TAKE 1 TABLET BY MOUTH AT BEDTIME, Disp: 90 tablet, Rfl: 1 .  rosuvastatin (CRESTOR) 20 MG tablet, TAKE 1 TABLET BY MOUTH DAILY, Disp: 90 tablet, Rfl: 1 .  venlafaxine (EFFEXOR) 75 MG tablet, Take 1 tablet (75 mg total) by mouth daily., Disp: 10 tablet, Rfl: 0 .  oxyCODONE-acetaminophen (PERCOCET) 10-325 MG tablet, Take 1 tablet by mouth 2 (two) times daily as needed for pain. Refill in 1 month, Disp: 60 tablet, Rfl: 0 .  oxyCODONE-acetaminophen (PERCOCET) 10-325 MG tablet, Take 1 tablet by mouth 2 (two) times daily as needed for pain., Disp: 60 tablet, Rfl: 0  Review of Systems:  Constitutional: Denies fever, chills, diaphoresis, appetite change and fatigue.  HEENT: Denies photophobia, eye pain, redness, hearing loss, ear pain, congestion, sore throat, rhinorrhea, sneezing, mouth sores, trouble swallowing, neck pain, neck stiffness and tinnitus.   Respiratory: Denies cough, chest tightness,  and wheezing.  Has had increase in shortness of breath and dyspnea on exertion Cardiovascular: Denies chest pain, palpitations and leg swelling.  Gastrointestinal: Denies nausea, vomiting, abdominal pain, diarrhea, constipation, blood in stool and abdominal distention.  Genitourinary: Denies dysuria, urgency, frequency, hematuria, flank pain and difficulty urinating.  Endocrine: Denies: hot or cold intolerance, sweats, changes in hair or nails, polyuria,  polydipsia. Musculoskeletal: Denies myalgias, back pain, joint swelling, arthralgias and gait problem.  Skin: Denies pallor, rash and wound.  Neurological: Denies dizziness, seizures, syncope, weakness, light-headedness, numbness and headaches.  Hematological: Denies adenopathy. Easy bruising, personal or family bleeding history  Psychiatric/Behavioral: Denies suicidal ideation, mood changes, confusion, nervousness, sleep disturbance and agitation    Physical Exam: Vitals:   12/24/17 1631  BP: 140/80  Pulse: 62  Temp: 98.3 F (36.8 C)  TempSrc: Oral  SpO2: 96%  Weight: 191 lb 4.8 oz (86.8 kg)  Height: 5\' 9"  (1.753 m)    Body mass index is 28.25 kg/m.   Constitutional: NAD, calm, comfortable Eyes: PERRL, lids and conjunctivae normal ENMT: Mucous membranes are moist. Neck: normal, supple, no masses, no thyromegaly Respiratory: clear to auscultation bilaterally, no  wheezing, no crackles. Normal respiratory effort. No accessory muscle use.  Cardiovascular: Regular rate and rhythm, no murmurs / rubs / gallops. No extremity edema. 2+ pedal pulses. No carotid bruits.  Abdomen: no tenderness, no masses palpated. No hepatosplenomegaly. Bowel sounds positive.  Musculoskeletal: no clubbing / cyanosis. No joint deformity upper and lower extremities. Good ROM, no contractures. Normal muscle tone.  Skin: no rashes, lesions, ulcers. No induration Neurologic: CN 2-12 grossly intact. Sensation intact, DTR normal. Strength 5/5 in all 4.  Psychiatric: Normal judgment and insight. Alert and oriented x 3. Normal mood.    Impression and Plan:  Essential hypertension -Not well controlled, followed by cardiology with recent addition of hydrochlorothiazide.  Other specified hypothyroidism  -I question this diagnosis, he is not on levothyroxine and he had a TSH drawn today that was 3.0. -I will strike this diagnosis from his record.  CRI (chronic renal insufficiency), stage 3 (moderate)  (HCC) -Baseline creatinine appears to be between 1.2 and 1.6 with a GFR of greater than 30 but less than 60.  However has had a recent increase in creatinine, may eventually need referral to nephrology.  Exertional shortness of breath Coronary artery disease involving native coronary artery of native heart without angina pectoris -Scheduled for stress test later this month.  Continue to follow with cardiology  B12 deficiency  -Will receive B12 injection today  High risk medication use Chronic low back pain without sciatica, unspecified back pain laterality Chronic low back pain -Have discussed prescription of Percocet with patient at length today.  He has been on this medication for over 15 years, he has not escalated the dose, has no red flags (see below for details). -I have explained to him that oxycodone is not typically a medication that I am comfortable prescribing long-term and that as of right now he is the only patient in my practice that I am willing to prescribe oxycodone to. -He has been to the pain management clinic in the past.  [facility] printed, initialed  and scanned into the EMR.   Indication for chronic opioid:  Painful neuropathy and chronic low back pain secondary to degenerative joint disease Medication and dose:  Oxycodone 10 mg, half a tablet 3-4 times a day # pills per month:  Maximum 60/month Last UDS date:  Will obtain today Opioid Treatment Agreement signed:  Yes Opioid Treatment Agreement last reviewed with patient:  12/24/2017 [facility] reviewed this encounter (include red flags):   Yes, no red flags, overdose risk score is 170      Patient Instructions  -It was nice meeting you today!  -Medication refills have been sent to [facility].  -Please follow-up as scheduled for your stress test later this month.  -Please schedule your annual Medicare wellness visit with our wellness coach.  Please come fasting to that visit.  -I will see you back  in follow-up in 3 months per opioid management protocol.     [name], MD [facility]

## 2017-12-25 LAB — CBC
HEMATOCRIT: 37.7 % (ref 37.5–51.0)
Hemoglobin: 12.6 g/dL — ABNORMAL LOW (ref 13.0–17.7)
MCH: 28.6 pg (ref 26.6–33.0)
MCHC: 33.4 g/dL (ref 31.5–35.7)
MCV: 86 fL (ref 79–97)
Platelets: 149 10*3/uL — ABNORMAL LOW (ref 150–450)
RBC: 4.4 x10E6/uL (ref 4.14–5.80)
RDW: 13.1 % (ref 12.3–15.4)
WBC: 5.8 10*3/uL (ref 3.4–10.8)

## 2017-12-25 LAB — COMPREHENSIVE METABOLIC PANEL
A/G RATIO: 1.6 (ref 1.2–2.2)
ALT: 10 IU/L (ref 0–44)
AST: 16 IU/L (ref 0–40)
Albumin: 4.1 g/dL (ref 3.5–4.8)
Alkaline Phosphatase: 80 IU/L (ref 39–117)
BUN/Creatinine Ratio: 16 (ref 10–24)
BUN: 25 mg/dL (ref 8–27)
Bilirubin Total: 0.5 mg/dL (ref 0.0–1.2)
CO2: 25 mmol/L (ref 20–29)
Calcium: 9.2 mg/dL (ref 8.6–10.2)
Chloride: 100 mmol/L (ref 96–106)
Creatinine, Ser: 1.61 mg/dL — ABNORMAL HIGH (ref 0.76–1.27)
GFR calc non Af Amer: 41 mL/min/{1.73_m2} — ABNORMAL LOW (ref >59)
GFR, EST AFRICAN AMERICAN: 48 mL/min/{1.73_m2} — AB (ref >59)
GLOBULIN, TOTAL: 2.6 g/dL (ref 1.5–4.5)
Glucose: 117 mg/dL — ABNORMAL HIGH (ref 65–99)
POTASSIUM: 4.6 mmol/L (ref 3.5–5.2)
SODIUM: 141 mmol/L (ref 134–144)
TOTAL PROTEIN: 6.7 g/dL (ref 6.0–8.5)

## 2017-12-25 LAB — LIPID PANEL
CHOL/HDL RATIO: 3.5 ratio (ref 0.0–5.0)
Cholesterol, Total: 99 mg/dL — ABNORMAL LOW (ref 100–199)
HDL: 28 mg/dL — ABNORMAL LOW (ref >39)
LDL CALC: 32 mg/dL (ref 0–99)
Triglycerides: 193 mg/dL — ABNORMAL HIGH (ref 0–149)
VLDL Cholesterol Cal: 39 mg/dL (ref 5–40)

## 2017-12-25 LAB — TSH: TSH: 3 u[IU]/mL (ref 0.450–4.500)

## 2017-12-27 ENCOUNTER — Other Ambulatory Visit: Payer: Self-pay | Admitting: Internal Medicine

## 2017-12-29 ENCOUNTER — Telehealth: Payer: Self-pay | Admitting: Gastroenterology

## 2017-12-29 ENCOUNTER — Ambulatory Visit (AMBULATORY_SURGERY_CENTER): Payer: Medicare Other | Admitting: Gastroenterology

## 2017-12-29 ENCOUNTER — Encounter: Payer: Self-pay | Admitting: Gastroenterology

## 2017-12-29 VITALS — BP 124/67 | HR 64 | Temp 97.5°F | Resp 17 | Ht 69.0 in | Wt 195.0 lb

## 2017-12-29 DIAGNOSIS — K222 Esophageal obstruction: Secondary | ICD-10-CM | POA: Diagnosis not present

## 2017-12-29 DIAGNOSIS — R1310 Dysphagia, unspecified: Secondary | ICD-10-CM

## 2017-12-29 DIAGNOSIS — R1030 Lower abdominal pain, unspecified: Secondary | ICD-10-CM

## 2017-12-29 DIAGNOSIS — K317 Polyp of stomach and duodenum: Secondary | ICD-10-CM | POA: Diagnosis not present

## 2017-12-29 DIAGNOSIS — Z1211 Encounter for screening for malignant neoplasm of colon: Secondary | ICD-10-CM | POA: Diagnosis not present

## 2017-12-29 DIAGNOSIS — K2270 Barrett's esophagus without dysplasia: Secondary | ICD-10-CM

## 2017-12-29 DIAGNOSIS — D124 Benign neoplasm of descending colon: Secondary | ICD-10-CM | POA: Diagnosis not present

## 2017-12-29 DIAGNOSIS — K219 Gastro-esophageal reflux disease without esophagitis: Secondary | ICD-10-CM | POA: Diagnosis not present

## 2017-12-29 MED ORDER — SODIUM CHLORIDE 0.9 % IV SOLN: 500.0000 mL | Freq: Once | INTRAVENOUS | Status: DC

## 2017-12-29 NOTE — Patient Instructions (Addendum)
Follow Dilation Diet. Please read handouts provided. Await pathology results. Continue present medications.       YOU HAD AN ENDOSCOPIC PROCEDURE TODAY AT [facility]:   Refer to the procedure report that was given to you for any specific questions about what was found during the examination.  If the procedure report does not answer your questions, please call your gastroenterologist to clarify.  If you requested that your care partner not be given the details of your procedure findings, then the procedure report has been included in a sealed envelope for you to review at your convenience later.  YOU SHOULD EXPECT: Some feelings of bloating in the abdomen. Passage of more gas than usual.  Walking can help get rid of the air that was put into your GI tract during the procedure and reduce the bloating. If you had a lower endoscopy (such as a colonoscopy or flexible sigmoidoscopy) you may notice spotting of blood in your stool or on the toilet paper. If you underwent a bowel prep for your procedure, you may not have a normal bowel movement for a few days.  Please Note:  You might notice some irritation and congestion in your nose or some drainage.  This is from the oxygen used during your procedure.  There is no need for concern and it should clear up in a day or so.  SYMPTOMS TO REPORT IMMEDIATELY:   Following lower endoscopy (colonoscopy or flexible sigmoidoscopy):  Excessive amounts of blood in the stool  Significant tenderness or worsening of abdominal pains  Swelling of the abdomen that is new, acute  Fever of 100F or higher   Following upper endoscopy (EGD)  Vomiting of blood or coffee ground material  New chest pain or pain under the shoulder blades  Painful or persistently difficult swallowing  New shortness of breath  Fever of 100F or higher  Black, tarry-looking stools  For urgent or emergent issues, a gastroenterologist can be reached at any hour by  calling [phone].   DIET:  Drink plenty of fluids but you should avoid alcoholic beverages for 24 hours.  ACTIVITY:  You should plan to take it easy for the rest of today and you should NOT DRIVE or use heavy machinery until tomorrow (because of the sedation medicines used during the test).    FOLLOW UP: Our staff will call the number listed on your records the next business day following your procedure to check on you and address any questions or concerns that you may have regarding the information given to you following your procedure. If we do not reach you, we will leave a message.  However, if you are feeling well and you are not experiencing any problems, there is no need to return our call.  We will assume that you have returned to your regular daily activities without incident.  If any biopsies were taken you will be contacted by phone or by letter within the next 1-3 weeks.  Please call us at [phone] if you have not heard about the biopsies in 3 weeks.    SIGNATURES/CONFIDENTIALITY: You and/or your care partner have signed paperwork which will be entered into your electronic medical record.  These signatures attest to the fact that that the information above on your After Visit Summary has been reviewed and is understood.  Full responsibility of the confidentiality of this discharge information lies with you and/or your care-partner.

## 2017-12-29 NOTE — Progress Notes (Signed)
PT taken to PACU. Monitors in place. VSS. Report given to RN. 

## 2017-12-29 NOTE — Op Note (Addendum)
[facility] Patient Name: [name] Procedure Date: 12/29/2017 2:24 PM MRN: [id number] Endoscopist: Remo Lipps P. Havery Moros , MD Age: 77 Referring MD:  Date of Birth: 1940/07/12 Gender: Male Account #: [id number] Procedure:                Colonoscopy Indications:              Lower abdominal pain sometimes improved with bowel                            movements, due for screening colonoscopy Medicines:                Monitored Anesthesia Care Procedure:                Pre-Anesthesia Assessment:                           - Prior to the procedure, a History and Physical                            was performed, and patient medications and                            allergies were reviewed. The patient's tolerance of                            previous anesthesia was also reviewed. The risks                            and benefits of the procedure and the sedation                            options and risks were discussed with the patient.                            All questions were answered, and informed consent                            was obtained. Prior Anticoagulants: The patient has                            taken no previous anticoagulant or antiplatelet                            agents. ASA Grade Assessment: III - A patient with                            severe systemic disease. After reviewing the risks                            and benefits, the patient was deemed in                            satisfactory condition to undergo the procedure.  After obtaining informed consent, the colonoscope                            was passed under direct vision. Throughout the                            procedure, the patient's blood pressure, pulse, and                            oxygen saturations were monitored continuously. The                            Colonoscope was introduced through the anus and                            advanced  to the the cecum, identified by                            appendiceal orifice and ileocecal valve. The                            colonoscopy was performed without difficulty. The                            patient tolerated the procedure well. The quality                            of the bowel preparation was adequate. The                            ileocecal valve, appendiceal orifice, and rectum                            were photographed. Scope In: 2:45:55 PM Scope Out: 3:00:45 PM Scope Withdrawal Time: 0 hours 12 minutes 16 seconds  Total Procedure Duration: 0 hours 14 minutes 50 seconds  Findings:                 The perianal and digital rectal examinations were                            normal.                           A 3 mm polyp was found in the descending colon. The                            polyp was sessile. The polyp was removed with a                            cold snare. Resection and retrieval were complete.                           A few small-mouthed diverticula were found in the  sigmoid colon and ascending colon.                           The exam was otherwise without abnormality. Of                            note, the entrance to the IC valve was quite                            angulated and could not be intubated despite                            multiple attempts. Complications:            No immediate complications. Estimated blood loss:                            Minimal. Estimated Blood Loss:     Estimated blood loss was minimal. Impression:               - One 3 mm polyp in the descending colon, removed                            with a cold snare. Resected and retrieved.                           - Diverticulosis in the sigmoid colon and in the                            ascending colon.                           - The examination was otherwise normal.                           No cause for pain noted on this  exam. Recommendation:           - Patient has a contact number available for                            emergencies. The signs and symptoms of potential                            delayed complications were discussed with the                            patient. Return to normal activities tomorrow.                            Written discharge instructions were provided to the                            patient.                           - Resume previous diet.                           -  Continue present medications.                           - Await pathology results                           - Consideration for non-IV contrast CT scan of the                            abdomen / pelvis to evaluate pain (given renal                            function) Remo Lipps P. Dewain Platz, MD 12/29/2017 3:07:01 PM This report has been signed electronically.

## 2017-12-29 NOTE — Telephone Encounter (Signed)
Colonoscopy done today, no cause for abdominal pain. Recommend CT abdomen / pelvis to further evaluate, would do without contrast due to his renal function. He agreed.  Patty can you help coordinate CT abdomen / pelvis without contrast for this patient to evaluate abdominal pain. Thanks

## 2017-12-29 NOTE — Progress Notes (Signed)
Called to room to assist during endoscopic procedure.  Patient ID and intended procedure confirmed with present staff. Received instructions for my participation in the procedure from the performing physician.  

## 2017-12-29 NOTE — Op Note (Signed)
[facility] Patient Name: [name] Procedure Date: 12/29/2017 2:25 PM MRN: [id number] Endoscopist: Remo Lipps P. Havery Moros , MD Age: 77 Referring MD:  Date of Birth: 1940-10-29 Gender: Male Account #: [id number] Procedure:                Upper GI endoscopy Indications:              Dysphagia, Follow-up of Barrett's esophagus -                            reflux better controlled on omeprazole 40mg  once                            daily Medicines:                Monitored Anesthesia Care Procedure:                Pre-Anesthesia Assessment:                           - Prior to the procedure, a History and Physical                            was performed, and patient medications and                            allergies were reviewed. The patient's tolerance of                            previous anesthesia was also reviewed. The risks                            and benefits of the procedure and the sedation                            options and risks were discussed with the patient.                            All questions were answered, and informed consent                            was obtained. Prior Anticoagulants: The patient has                            taken no previous anticoagulant or antiplatelet                            agents. ASA Grade Assessment: III - A patient with                            severe systemic disease. After reviewing the risks                            and benefits, the patient was deemed in  satisfactory condition to undergo the procedure.                           After obtaining informed consent, the endoscope was                            passed under direct vision. Throughout the                            procedure, the patient's blood pressure, pulse, and                            oxygen saturations were monitored continuously. The                            Model GIF-HQ190 [id number]) scope was  introduced                            through the mouth, and advanced to the second part                            of duodenum. The upper GI endoscopy was                            accomplished without difficulty. The patient                            tolerated the procedure well. Scope In: Scope Out: Findings:                 Esophagogastric landmarks were identified: the                            Z-line was found at 37 cm, the gastroesophageal                            junction was found at 38 cm and the upper extent of                            the gastric folds was found at 38 cm from the                            incisors.                           There were esophageal mucosal changes classified as                            Barrett's stage C0-M1 per Prague criteria present                            at the gastroesophageal junction. The maximum                            longitudinal extent of  these mucosal changes was 1                            cm in length (1cm tongue) with a few small islands                            of salmon colored mucosa. Biopsies were taken with                            a cold forceps for histology.                           One benign-appearing, intrinsic mild stenosis was                            found 38 cm from the incisors. A TTS dilator was                            passed through the scope. Dilation with a 16-17-18                            mm balloon dilator was performed to 16 mm, 17 mm                            and 18 mm after which an appropriate mucosal wrent                            was appreciated.                           The esophagus was tortous, but exam of the                            esophagus was otherwise normal.                           A single small sessile polyp was found in the                            cardia. Biopsies were taken with a cold forceps for                            histology.                            The exam of the stomach was otherwise normal aside                            from a few benign appearing small polyps.                           The duodenal bulb and second portion of the  duodenum were normal. Complications:            No immediate complications. Estimated blood loss:                            Minimal. Estimated Blood Loss:     Estimated blood loss was minimal. Impression:               - Esophagogastric landmarks identified.                           - Esophageal mucosal changes suspicious for 1cm                            tongue of Barrett's esophagus. Biopsied.                           - Benign-appearing esophageal stenosis. Dilated to                            78mm with good result.                           - A single gastric polyp. Biopsied.                           - Normal duodenal bulb and second portion of the                            duodenum. Recommendation:           - Patient has a contact number available for                            emergencies. The signs and symptoms of potential                            delayed complications were discussed with the                            patient. Return to normal activities tomorrow.                            Written discharge instructions were provided to the                            patient.                           - Resume previous diet.                           - Continue present medications.                           - Await pathology results. Remo Lipps P. Armbruster, MD 12/29/2017 3:12:04 PM This report has been signed electronically.

## 2017-12-30 ENCOUNTER — Telehealth: Payer: Self-pay | Admitting: *Deleted

## 2017-12-30 ENCOUNTER — Telehealth (HOSPITAL_COMMUNITY): Payer: Self-pay

## 2017-12-30 NOTE — Telephone Encounter (Signed)
Left message on machine to call back  

## 2017-12-30 NOTE — Telephone Encounter (Signed)
  Follow up Call-  Call back number 12/29/2017  Post procedure Call Back phone  # [phone]  Permission to leave phone message Yes  Some recent data might be hidden     Patient questions:  Do you have a fever, pain , or abdominal swelling? No. Pain Score  0 *  Have you tolerated food without any problems? Yes.    Have you been able to return to your normal activities? Yes.    Do you have any questions about your discharge instructions: Diet   No. Medications  No. Follow up visit  No.  Do you have questions or concerns about your Care? No.  Actions: * If pain score is 4 or above: No action needed, pain <4.

## 2017-12-30 NOTE — Telephone Encounter (Signed)
Encounter complete. 

## 2017-12-30 NOTE — Telephone Encounter (Signed)
You have been scheduled for a CT scan of the abdomen and pelvis at [facility] are scheduled on 01/05/18 at 1230 pm. You should arrive 15 minutes prior to your appointment time for registration. Please follow the written instructions below on the day of your exam.

## 2017-12-31 ENCOUNTER — Telehealth: Payer: Self-pay | Admitting: Gastroenterology

## 2017-12-31 MED ORDER — VENLAFAXINE HCL 75 MG PO TABS: 75.0000 mg | ORAL_TABLET | Freq: Every day | ORAL | 1 refills | Status: DC

## 2017-12-31 NOTE — Telephone Encounter (Signed)
The patient has been notified of this information and all questions answered. The pt will pick up contrast at [facility] prior to appt.  The pt has been advised of the information and verbalized understanding.

## 2017-12-31 NOTE — Telephone Encounter (Signed)
Pt states he went to get his venlafaxine (EFFEXOR) 75 MG tablet and it was only ten pills. He said that the ten was given the first time until his appt on 12/5 with Dr Jerilee Hoh. This last fill was for ten pills again. Patient said he takes one a day and usually gets a 90 day supply.  [facility] [address].

## 2017-12-31 NOTE — Addendum Note (Signed)
Addended by: Westley Hummer B on: 12/31/2017 02:35 PM   Modules accepted: Orders

## 2017-12-31 NOTE — Telephone Encounter (Signed)
Refill sent.

## 2017-12-31 NOTE — Telephone Encounter (Signed)
See alternate note the pt was given CT appt information

## 2018-01-04 ENCOUNTER — Ambulatory Visit (HOSPITAL_COMMUNITY)
Admission: RE | Admit: 2018-01-04 | Discharge: 2018-01-04 | Disposition: A | Discharge status: Home / Self Care | Payer: Medicare Other | Source: Ambulatory Visit | Attending: Cardiology | Admitting: Cardiology

## 2018-01-04 ENCOUNTER — Encounter: Payer: Self-pay | Admitting: Gastroenterology

## 2018-01-04 ENCOUNTER — Other Ambulatory Visit: Payer: Self-pay | Admitting: *Deleted

## 2018-01-04 DIAGNOSIS — I2510 Atherosclerotic heart disease of native coronary artery without angina pectoris: Secondary | ICD-10-CM | POA: Diagnosis not present

## 2018-01-04 DIAGNOSIS — Z951 Presence of aortocoronary bypass graft: Secondary | ICD-10-CM

## 2018-01-04 LAB — MYOCARDIAL PERFUSION IMAGING
CHL CUP NUCLEAR SDS: 4
LV dias vol: 111 mL (ref 62–150)
LV sys vol: 54 mL
Peak HR: 65 {beats}/min
Rest HR: 53 {beats}/min
SRS: 2
SSS: 6
TID: 1.18

## 2018-01-04 MED ORDER — TECHNETIUM TC 99M TETROFOSMIN IV KIT
8.3000 | PACK | Freq: Once | INTRAVENOUS | Status: AC | PRN
  Administered 2018-01-04: 8.3 via INTRAVENOUS
  Filled 2018-01-04: qty 9

## 2018-01-04 MED ORDER — TECHNETIUM TC 99M TETROFOSMIN IV KIT
25.9000 | PACK | Freq: Once | INTRAVENOUS | Status: AC | PRN
  Administered 2018-01-04: 25.9 via INTRAVENOUS
  Filled 2018-01-04: qty 26

## 2018-01-04 MED ORDER — NITROGLYCERIN 0.4 MG SL SUBL: 0.4000 mg | SUBLINGUAL_TABLET | SUBLINGUAL | 3 refills | Status: DC | PRN

## 2018-01-04 MED ORDER — REGADENOSON 0.4 MG/5ML IV SOLN
0.4000 mg | Freq: Once | INTRAVENOUS | Status: AC
  Administered 2018-01-04: 0.4 mg via INTRAVENOUS

## 2018-01-05 ENCOUNTER — Ambulatory Visit (HOSPITAL_COMMUNITY)
Admission: RE | Admit: 2018-01-05 | Discharge: 2018-01-05 | Disposition: A | Discharge status: Home / Self Care | Payer: Medicare Other | Source: Ambulatory Visit | Attending: Gastroenterology | Admitting: Gastroenterology

## 2018-01-05 ENCOUNTER — Encounter (HOSPITAL_COMMUNITY): Payer: Self-pay

## 2018-01-05 DIAGNOSIS — R1031 Right lower quadrant pain: Secondary | ICD-10-CM | POA: Diagnosis not present

## 2018-01-05 DIAGNOSIS — R1030 Lower abdominal pain, unspecified: Secondary | ICD-10-CM | POA: Insufficient documentation

## 2018-01-05 DIAGNOSIS — R195 Other fecal abnormalities: Secondary | ICD-10-CM | POA: Diagnosis not present

## 2018-01-07 ENCOUNTER — Telehealth: Payer: Self-pay | Admitting: Gastroenterology

## 2018-01-07 DIAGNOSIS — N35014 Post-traumatic urethral stricture, male, unspecified: Secondary | ICD-10-CM | POA: Diagnosis not present

## 2018-01-07 DIAGNOSIS — C672 Malignant neoplasm of lateral wall of bladder: Secondary | ICD-10-CM | POA: Diagnosis not present

## 2018-01-07 MED ORDER — DICYCLOMINE HCL 10 MG PO CAPS: 10.0000 mg | ORAL_CAPSULE | Freq: Three times a day (TID) | ORAL | 1 refills | Status: DC | PRN

## 2018-01-07 NOTE — Telephone Encounter (Signed)
Thanks Jan 

## 2018-01-07 NOTE — Telephone Encounter (Signed)
Pt returning your call

## 2018-01-07 NOTE — Telephone Encounter (Signed)
Called pt.  Relayed results and recommendations.  Patient expressed understanding. Sending script of bentyl to [facility] on [address] per his request. He said his pain never stops and gets worse when he needs to have a BM.  He said he will try the Bentyl and let us know in a week if it has helped.

## 2018-01-07 NOTE — Telephone Encounter (Signed)
Pt calling back returning your call

## 2018-01-07 NOTE — Telephone Encounter (Signed)
-----   Message from Yetta Flock, MD sent at 01/06/2018  7:59 AM EST ----- Jan can you let this patient know that I do not see any problems on his CT scan to account for his pain, it seems normal. His colonoscopy also looked good. Unsure what is driving his pain symptoms, could be bowel spasm. We can give him a trial of bentyl to use, 10mg  every 8 hrs PRN, if he wishes to try something for this. Thanks

## 2018-01-10 ENCOUNTER — Telehealth: Payer: Self-pay

## 2018-01-10 NOTE — Telephone Encounter (Signed)
Pt updated with test results along with Dr. Evette Georges recommendation. Verbalized understanding.

## 2018-01-10 NOTE — Telephone Encounter (Signed)
Incoming fax from [facility] for verification on quantity of Bentyl Rx sent for #[phone]. Correction updated with [facility] fax. Quantity changed to #[phone].

## 2018-02-01 DIAGNOSIS — M5416 Radiculopathy, lumbar region: Secondary | ICD-10-CM | POA: Diagnosis not present

## 2018-02-01 DIAGNOSIS — I10 Essential (primary) hypertension: Secondary | ICD-10-CM | POA: Diagnosis not present

## 2018-02-10 DIAGNOSIS — M5116 Intervertebral disc disorders with radiculopathy, lumbar region: Secondary | ICD-10-CM | POA: Diagnosis not present

## 2018-02-10 DIAGNOSIS — M5416 Radiculopathy, lumbar region: Secondary | ICD-10-CM | POA: Diagnosis not present

## 2018-02-10 DIAGNOSIS — M5117 Intervertebral disc disorders with radiculopathy, lumbosacral region: Secondary | ICD-10-CM | POA: Diagnosis not present

## 2018-02-10 DIAGNOSIS — M48061 Spinal stenosis, lumbar region without neurogenic claudication: Secondary | ICD-10-CM | POA: Diagnosis not present

## 2018-02-10 DIAGNOSIS — M4726 Other spondylosis with radiculopathy, lumbar region: Secondary | ICD-10-CM | POA: Diagnosis not present

## 2018-02-15 ENCOUNTER — Other Ambulatory Visit: Payer: Self-pay | Admitting: Cardiovascular Disease

## 2018-02-17 DIAGNOSIS — I10 Essential (primary) hypertension: Secondary | ICD-10-CM | POA: Diagnosis not present

## 2018-02-18 DIAGNOSIS — N35014 Post-traumatic urethral stricture, male, unspecified: Secondary | ICD-10-CM | POA: Diagnosis not present

## 2018-02-18 DIAGNOSIS — C672 Malignant neoplasm of lateral wall of bladder: Secondary | ICD-10-CM | POA: Diagnosis not present

## 2018-03-02 ENCOUNTER — Other Ambulatory Visit: Payer: Self-pay | Admitting: Internal Medicine

## 2018-03-03 ENCOUNTER — Other Ambulatory Visit: Payer: Self-pay | Admitting: Internal Medicine

## 2018-03-03 DIAGNOSIS — I2510 Atherosclerotic heart disease of native coronary artery without angina pectoris: Secondary | ICD-10-CM | POA: Diagnosis not present

## 2018-03-03 DIAGNOSIS — M461 Sacroiliitis, not elsewhere classified: Secondary | ICD-10-CM | POA: Diagnosis not present

## 2018-03-03 DIAGNOSIS — M47816 Spondylosis without myelopathy or radiculopathy, lumbar region: Secondary | ICD-10-CM | POA: Diagnosis not present

## 2018-03-03 DIAGNOSIS — M5416 Radiculopathy, lumbar region: Secondary | ICD-10-CM | POA: Diagnosis not present

## 2018-03-03 DIAGNOSIS — Z79899 Other long term (current) drug therapy: Secondary | ICD-10-CM

## 2018-03-03 DIAGNOSIS — M48062 Spinal stenosis, lumbar region with neurogenic claudication: Secondary | ICD-10-CM | POA: Diagnosis not present

## 2018-03-03 NOTE — Telephone Encounter (Signed)
Copied from [facility] [id number]. Topic: Quick Communication - Rx Refill/Question >> Mar 03, 2018  2:22 PM Carolyn Stare wrote: Medication oxyCODONE-acetaminophen (PERCOCET) 10-325 MG tablet  Preferred [facility]   Agent: Please be advised that RX refills may take up to 3 business days. We ask that you follow-up with your pharmacy.

## 2018-03-09 DIAGNOSIS — M5416 Radiculopathy, lumbar region: Secondary | ICD-10-CM | POA: Diagnosis not present

## 2018-03-09 DIAGNOSIS — I10 Essential (primary) hypertension: Secondary | ICD-10-CM | POA: Diagnosis not present

## 2018-03-09 DIAGNOSIS — M48062 Spinal stenosis, lumbar region with neurogenic claudication: Secondary | ICD-10-CM | POA: Diagnosis not present

## 2018-03-10 ENCOUNTER — Other Ambulatory Visit: Payer: Self-pay | Admitting: *Deleted

## 2018-03-10 MED ORDER — ETODOLAC 300 MG PO CAPS: ORAL_CAPSULE | ORAL | 1 refills | Status: DC

## 2018-03-23 ENCOUNTER — Ambulatory Visit: Payer: Medicare Other | Admitting: Cardiovascular Disease

## 2018-03-24 ENCOUNTER — Encounter: Payer: Self-pay | Admitting: *Deleted

## 2018-03-30 ENCOUNTER — Inpatient Hospital Stay (HOSPITAL_COMMUNITY)
Admission: EM | Admit: 2018-03-30 | Discharge: 2018-04-06 | DRG: 871 | Disposition: A | Discharge status: Home Health | Payer: Medicare Other | Attending: Internal Medicine | Admitting: Internal Medicine

## 2018-03-30 ENCOUNTER — Other Ambulatory Visit: Payer: Self-pay

## 2018-03-30 ENCOUNTER — Encounter (HOSPITAL_COMMUNITY): Payer: Self-pay

## 2018-03-30 ENCOUNTER — Emergency Department (HOSPITAL_COMMUNITY): Payer: Medicare Other

## 2018-03-30 DIAGNOSIS — E872 Acidosis: Secondary | ICD-10-CM | POA: Diagnosis present

## 2018-03-30 DIAGNOSIS — G2581 Restless legs syndrome: Secondary | ICD-10-CM | POA: Diagnosis present

## 2018-03-30 DIAGNOSIS — K219 Gastro-esophageal reflux disease without esophagitis: Secondary | ICD-10-CM | POA: Diagnosis present

## 2018-03-30 DIAGNOSIS — A419 Sepsis, unspecified organism: Secondary | ICD-10-CM | POA: Diagnosis not present

## 2018-03-30 DIAGNOSIS — G4733 Obstructive sleep apnea (adult) (pediatric): Secondary | ICD-10-CM | POA: Diagnosis present

## 2018-03-30 DIAGNOSIS — K319 Disease of stomach and duodenum, unspecified: Secondary | ICD-10-CM | POA: Diagnosis present

## 2018-03-30 DIAGNOSIS — K831 Obstruction of bile duct: Secondary | ICD-10-CM | POA: Diagnosis not present

## 2018-03-30 DIAGNOSIS — E875 Hyperkalemia: Secondary | ICD-10-CM | POA: Diagnosis not present

## 2018-03-30 DIAGNOSIS — A4151 Sepsis due to Escherichia coli [E. coli]: Principal | ICD-10-CM | POA: Diagnosis present

## 2018-03-30 DIAGNOSIS — J810 Acute pulmonary edema: Secondary | ICD-10-CM

## 2018-03-30 DIAGNOSIS — Z8551 Personal history of malignant neoplasm of bladder: Secondary | ICD-10-CM

## 2018-03-30 DIAGNOSIS — E119 Type 2 diabetes mellitus without complications: Secondary | ICD-10-CM | POA: Diagnosis not present

## 2018-03-30 DIAGNOSIS — K838 Other specified diseases of biliary tract: Secondary | ICD-10-CM | POA: Diagnosis not present

## 2018-03-30 DIAGNOSIS — E1122 Type 2 diabetes mellitus with diabetic chronic kidney disease: Secondary | ICD-10-CM | POA: Diagnosis not present

## 2018-03-30 DIAGNOSIS — N183 Chronic kidney disease, stage 3 unspecified: Secondary | ICD-10-CM | POA: Diagnosis present

## 2018-03-30 DIAGNOSIS — Z1612 Extended spectrum beta lactamase (ESBL) resistance: Secondary | ICD-10-CM | POA: Diagnosis not present

## 2018-03-30 DIAGNOSIS — Z7982 Long term (current) use of aspirin: Secondary | ICD-10-CM

## 2018-03-30 DIAGNOSIS — A499 Bacterial infection, unspecified: Secondary | ICD-10-CM

## 2018-03-30 DIAGNOSIS — Z951 Presence of aortocoronary bypass graft: Secondary | ICD-10-CM

## 2018-03-30 DIAGNOSIS — Z79899 Other long term (current) drug therapy: Secondary | ICD-10-CM | POA: Diagnosis not present

## 2018-03-30 DIAGNOSIS — I2510 Atherosclerotic heart disease of native coronary artery without angina pectoris: Secondary | ICD-10-CM | POA: Diagnosis not present

## 2018-03-30 DIAGNOSIS — K859 Acute pancreatitis without necrosis or infection, unspecified: Secondary | ICD-10-CM | POA: Diagnosis present

## 2018-03-30 DIAGNOSIS — B3781 Candidal esophagitis: Secondary | ICD-10-CM | POA: Diagnosis present

## 2018-03-30 DIAGNOSIS — N179 Acute kidney failure, unspecified: Secondary | ICD-10-CM | POA: Diagnosis not present

## 2018-03-30 DIAGNOSIS — J449 Chronic obstructive pulmonary disease, unspecified: Secondary | ICD-10-CM | POA: Diagnosis not present

## 2018-03-30 DIAGNOSIS — K851 Biliary acute pancreatitis without necrosis or infection: Secondary | ICD-10-CM | POA: Diagnosis present

## 2018-03-30 DIAGNOSIS — E11649 Type 2 diabetes mellitus with hypoglycemia without coma: Secondary | ICD-10-CM | POA: Diagnosis not present

## 2018-03-30 DIAGNOSIS — R1013 Epigastric pain: Secondary | ICD-10-CM | POA: Diagnosis not present

## 2018-03-30 DIAGNOSIS — K810 Acute cholecystitis: Secondary | ICD-10-CM | POA: Diagnosis present

## 2018-03-30 DIAGNOSIS — I10 Essential (primary) hypertension: Secondary | ICD-10-CM | POA: Diagnosis not present

## 2018-03-30 DIAGNOSIS — Z87891 Personal history of nicotine dependence: Secondary | ICD-10-CM

## 2018-03-30 DIAGNOSIS — K8309 Other cholangitis: Secondary | ICD-10-CM | POA: Diagnosis not present

## 2018-03-30 DIAGNOSIS — R7881 Bacteremia: Secondary | ICD-10-CM | POA: Diagnosis not present

## 2018-03-30 DIAGNOSIS — E876 Hypokalemia: Secondary | ICD-10-CM | POA: Diagnosis not present

## 2018-03-30 DIAGNOSIS — D72829 Elevated white blood cell count, unspecified: Secondary | ICD-10-CM | POA: Diagnosis present

## 2018-03-30 DIAGNOSIS — E785 Hyperlipidemia, unspecified: Secondary | ICD-10-CM | POA: Diagnosis present

## 2018-03-30 DIAGNOSIS — I5030 Unspecified diastolic (congestive) heart failure: Secondary | ICD-10-CM | POA: Diagnosis present

## 2018-03-30 DIAGNOSIS — R109 Unspecified abdominal pain: Secondary | ICD-10-CM

## 2018-03-30 DIAGNOSIS — I130 Hypertensive heart and chronic kidney disease with heart failure and stage 1 through stage 4 chronic kidney disease, or unspecified chronic kidney disease: Secondary | ICD-10-CM | POA: Diagnosis not present

## 2018-03-30 DIAGNOSIS — Z01810 Encounter for preprocedural cardiovascular examination: Secondary | ICD-10-CM | POA: Diagnosis not present

## 2018-03-30 DIAGNOSIS — E1165 Type 2 diabetes mellitus with hyperglycemia: Secondary | ICD-10-CM | POA: Diagnosis present

## 2018-03-30 DIAGNOSIS — Z981 Arthrodesis status: Secondary | ICD-10-CM

## 2018-03-30 DIAGNOSIS — R1084 Generalized abdominal pain: Secondary | ICD-10-CM | POA: Diagnosis not present

## 2018-03-30 HISTORY — DX: Restless legs syndrome: G25.81

## 2018-03-30 LAB — COMPREHENSIVE METABOLIC PANEL
ALT: 32 U/L (ref 0–44)
ANION GAP: 11 (ref 5–15)
AST: 70 U/L — ABNORMAL HIGH (ref 15–41)
Albumin: 3.8 g/dL (ref 3.5–5.0)
Alkaline Phosphatase: 90 U/L (ref 38–126)
BUN: 20 mg/dL (ref 8–23)
CO2: 22 mmol/L (ref 22–32)
Calcium: 8.9 mg/dL (ref 8.9–10.3)
Chloride: 106 mmol/L (ref 98–111)
Creatinine, Ser: 1.59 mg/dL — ABNORMAL HIGH (ref 0.61–1.24)
GFR calc Af Amer: 48 mL/min — ABNORMAL LOW (ref >60)
GFR calc non Af Amer: 42 mL/min — ABNORMAL LOW (ref >60)
Glucose, Bld: 182 mg/dL — ABNORMAL HIGH (ref 70–99)
Potassium: 4.2 mmol/L (ref 3.5–5.1)
SODIUM: 139 mmol/L (ref 135–145)
Total Bilirubin: 1.5 mg/dL — ABNORMAL HIGH (ref 0.3–1.2)
Total Protein: 7.6 g/dL (ref 6.5–8.1)

## 2018-03-30 LAB — CBC
HCT: 41.5 % (ref 39.0–52.0)
Hemoglobin: 13.2 g/dL (ref 13.0–17.0)
MCH: 27.9 pg (ref 26.0–34.0)
MCHC: 31.8 g/dL (ref 30.0–36.0)
MCV: 87.7 fL (ref 80.0–100.0)
PLATELETS: 186 10*3/uL (ref 150–400)
RBC: 4.73 MIL/uL (ref 4.22–5.81)
RDW: 14.1 % (ref 11.5–15.5)
WBC: 21.4 10*3/uL — ABNORMAL HIGH (ref 4.0–10.5)
nRBC: 0 % (ref 0.0–0.2)

## 2018-03-30 LAB — URINALYSIS, ROUTINE W REFLEX MICROSCOPIC
Glucose, UA: NEGATIVE mg/dL
Hgb urine dipstick: NEGATIVE
KETONES UR: NEGATIVE mg/dL
Leukocytes,Ua: NEGATIVE
Nitrite: NEGATIVE
PH: 5 (ref 5.0–8.0)
Protein, ur: 30 mg/dL — AB
Specific Gravity, Urine: 1.014 (ref 1.005–1.030)

## 2018-03-30 LAB — LIPASE, BLOOD: Lipase: 390 U/L — ABNORMAL HIGH (ref 11–51)

## 2018-03-30 LAB — I-STAT TROPONIN, ED: Troponin i, poc: 0.05 ng/mL (ref 0.00–0.08)

## 2018-03-30 LAB — LACTIC ACID, PLASMA: Lactic Acid, Venous: 2.4 mmol/L (ref 0.5–1.9)

## 2018-03-30 MED ORDER — INSULIN ASPART 100 UNIT/ML ~~LOC~~ SOLN: 0.0000 [IU] | SUBCUTANEOUS | Status: DC

## 2018-03-30 MED ORDER — SODIUM CHLORIDE 0.9 % IV BOLUS (SEPSIS)
1000.0000 mL | Freq: Once | INTRAVENOUS | Status: AC
  Administered 2018-03-30: 1000 mL via INTRAVENOUS

## 2018-03-30 MED ORDER — IOHEXOL 300 MG/ML  SOLN
80.0000 mL | Freq: Once | INTRAMUSCULAR | Status: AC | PRN
  Administered 2018-03-30: 80 mL via INTRAVENOUS

## 2018-03-30 MED ORDER — FENTANYL CITRATE (PF) 100 MCG/2ML IJ SOLN
25.0000 ug | INTRAMUSCULAR | Status: DC | PRN
  Administered 2018-03-31: 50 ug via INTRAVENOUS
  Administered 2018-03-31: 25 ug via INTRAVENOUS
  Administered 2018-03-31 – 2018-04-01 (×5): 50 ug via INTRAVENOUS
  Administered 2018-04-01: 100 ug via INTRAVENOUS
  Administered 2018-04-01 – 2018-04-05 (×10): 50 ug via INTRAVENOUS
  Filled 2018-03-30 (×18): qty 2

## 2018-03-30 MED ORDER — ACETAMINOPHEN 650 MG RE SUPP: 650.0000 mg | Freq: Four times a day (QID) | RECTAL | Status: DC | PRN

## 2018-03-30 MED ORDER — SODIUM CHLORIDE 0.9 % IV SOLN
1.0000 g | Freq: Once | INTRAVENOUS | Status: AC
  Administered 2018-03-30: 1 g via INTRAVENOUS
  Filled 2018-03-30: qty 10

## 2018-03-30 MED ORDER — PIPERACILLIN-TAZOBACTAM 3.375 G IVPB 30 MIN: 3.3750 g | Freq: Once | INTRAVENOUS | Status: DC

## 2018-03-30 MED ORDER — MORPHINE SULFATE (PF) 4 MG/ML IV SOLN
4.0000 mg | Freq: Once | INTRAVENOUS | Status: AC
  Administered 2018-03-30: 4 mg via INTRAVENOUS
  Filled 2018-03-30: qty 1

## 2018-03-30 MED ORDER — ONDANSETRON HCL 4 MG PO TABS: 4.0000 mg | ORAL_TABLET | Freq: Four times a day (QID) | ORAL | Status: DC | PRN

## 2018-03-30 MED ORDER — PANTOPRAZOLE SODIUM 40 MG IV SOLR
40.0000 mg | Freq: Once | INTRAVENOUS | Status: DC
  Filled 2018-03-30: qty 40

## 2018-03-30 MED ORDER — ONDANSETRON HCL 4 MG/2ML IJ SOLN: 4.0000 mg | Freq: Four times a day (QID) | INTRAMUSCULAR | Status: DC | PRN

## 2018-03-30 MED ORDER — HYDRALAZINE HCL 20 MG/ML IJ SOLN: 10.0000 mg | INTRAMUSCULAR | Status: DC | PRN

## 2018-03-30 MED ORDER — METRONIDAZOLE IN NACL 5-0.79 MG/ML-% IV SOLN
500.0000 mg | Freq: Three times a day (TID) | INTRAVENOUS | Status: DC
  Administered 2018-03-31 – 2018-04-01 (×5): 500 mg via INTRAVENOUS
  Filled 2018-03-30 (×5): qty 100

## 2018-03-30 MED ORDER — PANTOPRAZOLE SODIUM 40 MG IV SOLR
40.0000 mg | Freq: Once | INTRAVENOUS | Status: AC
  Administered 2018-03-30: 40 mg via INTRAVENOUS

## 2018-03-30 MED ORDER — SODIUM CHLORIDE 0.9% FLUSH
3.0000 mL | Freq: Once | INTRAVENOUS | Status: AC
  Administered 2018-03-30: 3 mL via INTRAVENOUS

## 2018-03-30 MED ORDER — SODIUM CHLORIDE 0.9 % IV SOLN
INTRAVENOUS | Status: AC
  Administered 2018-03-31: 06:00:00 via INTRAVENOUS

## 2018-03-30 MED ORDER — ACETAMINOPHEN 325 MG PO TABS
650.0000 mg | ORAL_TABLET | Freq: Four times a day (QID) | ORAL | Status: DC | PRN
  Administered 2018-03-31 – 2018-04-05 (×7): 650 mg via ORAL
  Filled 2018-03-30 (×9): qty 2

## 2018-03-30 MED ORDER — SODIUM CHLORIDE 0.9 % IV SOLN
1000.0000 mL | INTRAVENOUS | Status: DC
  Administered 2018-03-30: 1000 mL via INTRAVENOUS

## 2018-03-30 NOTE — ED Notes (Signed)
Pt in CT.

## 2018-03-30 NOTE — ED Provider Notes (Signed)
[facility] EMERGENCY DEPARTMENT Provider Note   CSN: [id number] Arrival date & time: 03/30/18  1556    History   Chief Complaint Chief Complaint  Patient presents with   Abdominal Pain   Tremors    HPI [name] is a 78 y.o. male.     HPI Patient reports he been having severe pain in his abdomen.  Patient was today he was having severe epigastric pain.  He reports that he felt severely nauseated but was unable to vomit.  Patient reports urinary frequency and dysuria.  He has not had a fever that he is documented.  He reports that he has dealt for a longer period of time with pretty regular pain that involves his lower flank and abdomen region and radiates to the testicles and upper thighs.  He reports that has been problem is dealt with for actually a number of years.  He reports he is always in pain regarding the situation.  He has had transitional cell bladder cancer and required urethral stricture dilations in the past.  He reports that his last dilation did not yield him any relief.  Patient does not drink any alcohol. Past Medical History:  Diagnosis Date   Allergy    Anemia    B12 deficiency anemia    Blood transfusion without reported diagnosis    CAD (coronary artery disease)    Cancer ([place] [place])    bladder cancer 8 times   Colon polyps    COPD (chronic obstructive pulmonary disease) (HCC)    Depression    Esophagitis    Esophagus, Barrett's    GERD (gastroesophageal reflux disease)    History of bladder cancer    Hyperlipidemia    Hypertension    Hypothyroidism    pt denies    Localized osteoarthrosis, lower leg    Low back pain    Malaise and fatigue    Sleep apnea    Stenosis of esophagus     Patient Active Problem List   Diagnosis Date Noted   Osteoarthritis of left knee 10/18/2017   Cervical radiculopathy 10/04/2017   Pain of left hand 07/19/2017   Pre-operative cardiovascular examination 06/16/2017    CRI (chronic renal insufficiency), stage 3 (moderate) ([place]) 06/16/2017   Carpal tunnel syndrome 05/27/2017   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 05/10/2017   Open fracture of base of middle phalanx of finger 02/17/2017   Laceration of index finger 02/17/2017   Laceration of nail bed of finger 02/17/2017   Laceration of thumb 02/17/2017   Open fracture of distal phalanx of finger 02/17/2017   Open fractures of multiple sites of phalanx of left hand 11/24/2016   B12 deficiency 05/28/2015   GERD (gastroesophageal reflux disease) 11/16/2014   Hyperlipidemia LDL goal <70 11/21/2013   Exertional shortness of breath 11/21/2013   Other dysphagia 07/14/2013   History of esophageal stricture 07/14/2013   Hx of CABG 06/05/2013   Leg pain, bilateral 10/18/2012   Restless leg syndrome 06/21/2012   ULNAR NEUROPATHY, LEFT 08/05/2009   Urinary obstruction 10/19/2008   ACTINIC KERATOSIS 06/25/2008   DRY EYE SYNDROME 10/13/2007   CARCINOMA, BLADDER, HX OF 07/02/2007   BARRETTS ESOPHAGUS 05/23/2007   Hypothyroidism 04/08/2007   Other malaise and fatigue 04/08/2007   Essential hypertension 10/12/2006   ANEMIA, B12 DEFICIENCY 09/08/2006   DEPRESSION 07/29/2006   NEUROPATHY, IDIOPATHIC PERIPHERAL NEC 07/29/2006   Allergic rhinitis 07/29/2006   LOW BACK PAIN 07/29/2006   COLONIC POLYPS 11/04/2000    Past  Surgical History:  Procedure Laterality Date   bladder cancer     CERVICAL DISCECTOMY     ACDF   COLONOSCOPY  11/17/2005   normal    CORONARY ARTERY BYPASS GRAFT     x4   ESOPHAGOGASTRODUODENOSCOPY  04/29/2010   I&D EXTREMITY Left 11/24/2016   Procedure: IRRIGATION AND DEBRIDEMENT LEFT HAND, THUMB, INDEX, MIDDLE, RING, AND SMALL FINGERS WITH RECONSTRUCTION;  Surgeon: Roseanne Kaufman, MD;  Location: [facility];  Service: Orthopedics;  Laterality: Left;   KNEE ARTHROSCOPY     LUMBAR LAMINECTOMY     and fusion   NASAL SINUS SURGERY     POPLITEAL  SYNOVIAL CYST EXCISION          Home Medications    Prior to Admission medications   Medication Sig Start Date End Date Taking? Authorizing Provider  acetaminophen (TYLENOL) 325 MG tablet Take 650 mg by mouth every 6 (six) hours as needed for mild pain.   Yes [provider]  aspirin 81 MG tablet Take 81 mg by mouth daily.     Yes [provider]  BYSTOLIC 10 MG tablet TAKE ONE TABLET BY MOUTH ONCE DAILY Patient taking differently: Take 10 mg by mouth daily.  03/29/17  Yes Troy Sine, MD  cyanocobalamin (,VITAMIN B-12,) 1000 MCG/ML injection INJECT 1 ML (CC) INTRAMUSCULARLY FOR 1 DOSE Patient taking differently: Inject 1,000 mcg into the muscle once.  12/24/17  Yes Isaac Bliss, Rayford Halsted, MD  dicyclomine (BENTYL) 10 MG capsule Take 1 capsule (10 mg total) by mouth every 8 (eight) hours as needed for spasms. 01/07/18  Yes Armbruster, Carlota Raspberry, MD  etodolac (LODINE) 300 MG capsule TAKE 1 CAPSULE BY MOUTH EVERY 8 HOURS Patient taking differently: Take 300 mg by mouth every 8 (eight) hours.  03/10/18  Yes Isaac Bliss, Rayford Halsted, MD  ezetimibe (ZETIA) 10 MG tablet TAKE 1 TABLET BY MOUTH ONCE DAILY Patient taking differently: Take 10 mg by mouth daily.  09/07/17  Yes Marletta Lor, MD  folic acid (FOLVITE) 253 MCG tablet Take 400 mcg by mouth daily.     Yes [provider]  gabapentin (NEURONTIN) 600 MG tablet Take 1 tablet (600 mg total) by mouth 3 (three) times daily. 11/13/15  Yes Jamse Arn, MD  hydrochlorothiazide (MICROZIDE) 12.5 MG capsule Take 1 capsule (12.5 mg total) by mouth daily. 12/13/17 03/30/18 Yes Troy Sine, MD  isosorbide mononitrate (IMDUR) 60 MG 24 hr tablet TAKE 1 TABLET BY MOUTH ONCE DAILY Patient taking differently: Take 60 mg by mouth daily.  07/14/17  Yes Troy Sine, MD  nitroGLYCERIN (NITROSTAT) 0.4 MG SL tablet Place 1 tablet (0.4 mg total) under the tongue every 5 (five) minutes as needed for chest pain.  01/04/18  Yes Isaac Bliss, Rayford Halsted, MD  omeprazole (PRILOSEC) 40 MG capsule Take 1 capsule (40 mg total) by mouth daily. 12/02/17  Yes Armbruster, Carlota Raspberry, MD  oxyCODONE-acetaminophen (PERCOCET) 10-325 MG tablet Take 1 tablet by mouth 2 (two) times daily as needed. 12/24/17  Yes Isaac Bliss, Rayford Halsted, MD  Probiotic Product (PROBIOTIC ADVANCED PO) Take 1 tablet by mouth daily.   Yes [provider]  rOPINIRole (REQUIP) 2 MG tablet TAKE 1 TABLET BY MOUTH AT BEDTIME Patient taking differently: Take 2 mg by mouth at bedtime.  10/18/17  Yes Dorena Cookey, MD  rosuvastatin (CRESTOR) 20 MG tablet TAKE 1 TABLET BY MOUTH ONCE DAILY Patient taking differently: Take 20 mg by mouth daily.  02/16/18  Yes Troy Sine, MD  venlafaxine (EFFEXOR) 75 MG tablet Take 1 tablet (75 mg total) by mouth daily. 12/31/17  Yes Erline Hau, MD  NEEDLE, DISP, 25 G (B-D DISP NEEDLE 25GX1") 25G X 1" MISC Inject 1000 mcg into muscle once a month. 12/24/17   Isaac Bliss, Rayford Halsted, MD    Family History Family History  Problem Relation Age of Onset   Melanoma Mother    Stroke Father    Hypertension Father    Coronary artery disease Other    Colon cancer Neg Hx    Esophageal cancer Neg Hx    Stomach cancer Neg Hx    Rectal cancer Neg Hx     Social History Social History   Tobacco Use   Smoking status: Former Smoker    Last attempt to quit: 03/30/1976    Years since quitting: 42.0   Smokeless tobacco: Never Used   Tobacco comment: quit in 1978  Substance Use Topics   Alcohol use: No    Alcohol/week: 0.0 standard drinks   Drug use: No     Allergies   Losartan potassium and Tape   Review of Systems Review of Systems 10 Systems reviewed and are negative for acute change except as noted in the HPI.   Physical Exam Updated Vital Signs BP 139/76 (BP Location: Left Arm)    Pulse 73    Temp 98.1 F (36.7 C) (Oral)    Resp 18    SpO2 98%   Physical  Exam Constitutional:      Comments: Patient is alert and nontoxic.  He is uncomfortable in appearance.  No respiratory distress at rest.  HENT:     Head: Normocephalic and atraumatic.     Mouth/Throat:     Comments: Mouth is very dry.  Glossitis. Eyes:     Extraocular Movements: Extraocular movements intact.  Cardiovascular:     Rate and Rhythm: Normal rate and regular rhythm.  Pulmonary:     Effort: Pulmonary effort is normal.     Breath sounds: Normal breath sounds.  Abdominal:     Comments: Abdomen soft.  Moderate epigastric discomfort to palpation.  No guarding.  Moderate suprapubic tenderness to palpation as well.  Femoral pulses 2+ and symmetric.  No CVA tenderness to palpation.  Musculoskeletal:     Comments: No peripheral edema.  Calves are soft nontender.  Dorsalis pedis pulses 2+ and symmetric.  Skin condition very good.  Skin:    General: Skin is warm and dry.  Neurological:     General: No focal deficit present.     Mental Status: He is oriented to person, place, and time.     Coordination: Coordination normal.  Psychiatric:        Mood and Affect: Mood normal.      ED Treatments / Results  Labs (all labs ordered are listed, but only abnormal results are displayed) Labs Reviewed  LIPASE, BLOOD - Abnormal; Notable for the following components:      Result Value   Lipase 390 (*)    All other components within normal limits  COMPREHENSIVE METABOLIC PANEL - Abnormal; Notable for the following components:   Glucose, Bld 182 (*)    Creatinine, Ser 1.59 (*)    AST 70 (*)    Total Bilirubin 1.5 (*)    GFR calc non Af Amer 42 (*)    GFR calc Af Amer 48 (*)    All other components within normal limits  CBC -  Abnormal; Notable for the following components:   WBC 21.4 (*)    All other components within normal limits  URINALYSIS, ROUTINE W REFLEX MICROSCOPIC - Abnormal; Notable for the following components:   Color, Urine AMBER (*)    Bilirubin Urine SMALL (*)     Protein, ur 30 (*)    Bacteria, UA RARE (*)    All other components within normal limits  LACTIC ACID, PLASMA - Abnormal; Notable for the following components:   Lactic Acid, Venous 2.4 (*)    All other components within normal limits  CULTURE, BLOOD (ROUTINE X 2)  CULTURE, BLOOD (ROUTINE X 2)  LACTIC ACID, PLASMA  I-STAT TROPONIN, ED    EKG EKG Interpretation  Date/Time:  Wednesday March 30 2018 16:13:34 EDT Ventricular Rate:  64 PR Interval:  208 QRS Duration: 82 QT Interval:  396 QTC Calculation: 408 R Axis:   34 Text Interpretation:  Normal sinus rhythm Normal ECG No significant change since last tracing Confirmed by Deno Etienne [phone]) on 03/30/2018 8:23:05 PM   Radiology Ct Abdomen Pelvis W Contrast  Result Date: 03/30/2018 CLINICAL DATA:  78 y/o  M; generalized abdominal pain and tremors. EXAM: CT ABDOMEN AND PELVIS WITH CONTRAST TECHNIQUE: Multidetector CT imaging of the abdomen and pelvis was performed using the standard protocol following bolus administration of intravenous contrast. CONTRAST:  77mL OMNIPAQUE IOHEXOL 300 MG/ML  SOLN COMPARISON:  01/05/2018 CT abdomen and pelvis FINDINGS: Lower chest: No acute abnormality. Hepatobiliary: No focal liver abnormality. No gallbladder wall thickening or radiopaque gallstone identified within the gallbladder. Increased dilatation of the common bile duct measuring up to 15 mm at the liver hilum. The common bile duct near the ampulla is opacified (series 3, image 33 and series 6, image 66). Pancreas: Edema surrounding the pancreas compatible with acute pancreatitis. No findings of necrosis or acute peripancreatic collection at this time. No main duct dilatation. Spleen: Normal in size without focal abnormality. Adrenals/Urinary Tract: Adrenal glands are unremarkable. Kidneys are normal, without renal calculi, focal lesion, or hydronephrosis. Bladder is unremarkable. Stomach/Bowel: Stomach is within normal limits. Appendix not identified,  no pericecal inflammation. No evidence of bowel wall thickening, distention, or inflammatory changes. Vascular/Lymphatic: Aortic atherosclerosis. No enlarged abdominal or pelvic lymph nodes. Reproductive: Prostate is unremarkable. Other: No abdominal wall hernia or abnormality. No abdominopelvic ascites. Musculoskeletal: No fracture is seen. IMPRESSION: 1. Findings compatible with acute pancreatitis. No necrosis or acute peripancreatic collection identified at this time. 2. Increased dilatation of the common bile duct measuring up to 15 mm at the liver hilum and opacified common bile duct near the ampulla. No main duct dilatation of the pancreas. Findings may represent stricture, obstructing mass, or choledocholithiasis. This can be further evaluated with MRI/MRCP without and with contrast. Electronically Signed   By: Kristine Garbe M.D.   On: 03/30/2018 23:08    Procedures Procedures (including critical care time)  Medications Ordered in ED Medications  sodium chloride flush (NS) 0.9 % injection 3 mL (has no administration in time range)  cefTRIAXone (ROCEPHIN) 1 g in sodium chloride 0.9 % 100 mL IVPB (1 g Intravenous New Bag/Given 03/30/18 2333)  sodium chloride 0.9 % bolus 1,000 mL (has no administration in time range)    Followed by  0.9 %  sodium chloride infusion (1,000 mLs Intravenous New Bag/Given 03/30/18 2332)  morphine 4 MG/ML injection 4 mg (has no administration in time range)  pantoprazole (PROTONIX) injection 40 mg (has no administration in time range)  iohexol (OMNIPAQUE) 300 MG/ML  solution 80 mL (80 mLs Intravenous Contrast Given 03/30/18 2233)     Initial Impression / Assessment and Plan / ED Course  I have reviewed the triage vital signs and the nursing notes.  Pertinent labs & imaging results that were available during my care of the patient were reviewed by me and considered in my medical decision making (see chart for details).       Patient presents with more  acute epigastric upper abdominal pain.  A CT and lipase are consistent with pancreatitis.  CT also raises possibility of biliary dilation and necessity of MRCP.  Patient's LFTs are not significantly elevated to coaberate choledocholithiasis.  Patient does have significant leukocytosis.  He describes urinary symptoms although urinalysis is not grossly positive.  Patient started on fluid and Rocephin and pain control.  Plan for admission for treatment of pancreatitis and further diagnostic evaluation.  Urine and blood cultures pending.  Final Clinical Impressions(s) / ED Diagnoses   Final diagnoses:  Acute pancreatitis, unspecified complication status, unspecified pancreatitis type    ED Discharge Orders    None       Charlesetta Shanks, MD 03/30/18 2340

## 2018-03-30 NOTE — H&P (Signed)
History and Physical    [name] PPJ:[id number] DOB: 01/31/1940 DOA: 03/30/2018  PCP: Isaac Bliss, Rayford Halsted, MD   Patient coming from: Home   Chief Complaint: Abdominal pain, tremors   HPI: [name] is a 78 y.o. male with medical history significant for coronary artery disease, bladder cancer, hypertension, Barrett esophagus, hiatal hernia, and GERD, now presenting to the emergency department for evaluation of severe upper abdominal pain with nausea and shaking chills.  Patient reports that he has had chronic intermittent lower abdominal and groin pain, but for the past week has been experiencing some pain in his epigastrium, particularly after eating.  He has had nausea associated with this, but has not vomited.  He reports severe worsening in his symptoms over the past couple days with shaking chills.  Denies diarrhea, chest pain, or shortness of breath.  ED Course: Upon arrival to the ED, patient is found to be afebrile, saturating well on room air, and with vitals otherwise normal.  EKG features a sinus rhythm.  Chemistry panel is notable for creatinine 1.59, similar to priors, as well as mild elevation in AST and total bilirubin.  CBC is notable for leukocytosis to 21,400.  Lactic acid is elevated to 2.4.  Lipase is elevated to 390, troponin is normal, and UA notable for proteinuria.  Blood cultures were collected and the patient was treated with a liter of normal saline, morphine, Rocephin, and Zofran in the ED.  He remains hemodynamically stable and will be observed for ongoing evaluation and management.  Review of Systems:  All other systems reviewed and apart from HPI, are negative.  Past Medical History:  Diagnosis Date  . Allergy   . Anemia   . B12 deficiency anemia   . Blood transfusion without reported diagnosis   . CAD (coronary artery disease)   . Cancer ([place])    bladder cancer 8 times  . Colon polyps   . COPD (chronic obstructive pulmonary disease)  ([place])   . Depression   . Esophagitis   . Esophagus, Barrett's   . GERD (gastroesophageal reflux disease)   . History of bladder cancer   . Hyperlipidemia   . Hypertension   . Hypothyroidism    pt denies   . Localized osteoarthrosis, lower leg   . Low back pain   . Malaise and fatigue   . Sleep apnea   . Stenosis of esophagus     Past Surgical History:  Procedure Laterality Date  . bladder cancer    . CERVICAL DISCECTOMY     ACDF  . COLONOSCOPY  11/17/2005   normal   . CORONARY ARTERY BYPASS GRAFT     x4  . ESOPHAGOGASTRODUODENOSCOPY  04/29/2010  . I&D EXTREMITY Left 11/24/2016   Procedure: IRRIGATION AND DEBRIDEMENT LEFT HAND, THUMB, INDEX, MIDDLE, RING, AND SMALL FINGERS WITH RECONSTRUCTION;  Surgeon: Roseanne Kaufman, MD;  Location: [facility];  Service: Orthopedics;  Laterality: Left;  . KNEE ARTHROSCOPY    . LUMBAR LAMINECTOMY     and fusion  . NASAL SINUS SURGERY    . POPLITEAL SYNOVIAL CYST EXCISION       reports that he quit smoking about 42 years ago. He has never used smokeless tobacco. He reports that he does not drink alcohol or use drugs.  Allergies  Allergen Reactions  . Losartan Potassium Other (See Comments)    Hyperkalemia  . Tape Rash    Itching, reddened skin Itching, reddened skin    Family History  Problem  Relation Age of Onset  . Melanoma Mother   . Stroke Father   . Hypertension Father   . Coronary artery disease Other   . Colon cancer Neg Hx   . Esophageal cancer Neg Hx   . Stomach cancer Neg Hx   . Rectal cancer Neg Hx      Prior to Admission medications   Medication Sig Start Date End Date Taking? Authorizing Provider  acetaminophen (TYLENOL) 325 MG tablet Take 650 mg by mouth every 6 (six) hours as needed for mild pain.   Yes [provider]  aspirin 81 MG tablet Take 81 mg by mouth daily.     Yes [provider]  BYSTOLIC 10 MG tablet TAKE ONE TABLET BY MOUTH ONCE DAILY Patient taking differently: Take 10 mg by  mouth daily.  03/29/17  Yes Troy Sine, MD  cyanocobalamin (,VITAMIN B-12,) 1000 MCG/ML injection INJECT 1 ML (CC) INTRAMUSCULARLY FOR 1 DOSE Patient taking differently: Inject 1,000 mcg into the muscle once.  12/24/17  Yes Isaac Bliss, Rayford Halsted, MD  dicyclomine (BENTYL) 10 MG capsule Take 1 capsule (10 mg total) by mouth every 8 (eight) hours as needed for spasms. 01/07/18  Yes Armbruster, Carlota Raspberry, MD  etodolac (LODINE) 300 MG capsule TAKE 1 CAPSULE BY MOUTH EVERY 8 HOURS Patient taking differently: Take 300 mg by mouth every 8 (eight) hours.  03/10/18  Yes Isaac Bliss, Rayford Halsted, MD  ezetimibe (ZETIA) 10 MG tablet TAKE 1 TABLET BY MOUTH ONCE DAILY Patient taking differently: Take 10 mg by mouth daily.  09/07/17  Yes Marletta Lor, MD  folic acid (FOLVITE) 295 MCG tablet Take 400 mcg by mouth daily.     Yes [provider]  gabapentin (NEURONTIN) 600 MG tablet Take 1 tablet (600 mg total) by mouth 3 (three) times daily. 11/13/15  Yes Jamse Arn, MD  hydrochlorothiazide (MICROZIDE) 12.5 MG capsule Take 1 capsule (12.5 mg total) by mouth daily. 12/13/17 03/30/18 Yes Troy Sine, MD  isosorbide mononitrate (IMDUR) 60 MG 24 hr tablet TAKE 1 TABLET BY MOUTH ONCE DAILY Patient taking differently: Take 60 mg by mouth daily.  07/14/17  Yes Troy Sine, MD  nitroGLYCERIN (NITROSTAT) 0.4 MG SL tablet Place 1 tablet (0.4 mg total) under the tongue every 5 (five) minutes as needed for chest pain. 01/04/18  Yes Isaac Bliss, Rayford Halsted, MD  omeprazole (PRILOSEC) 40 MG capsule Take 1 capsule (40 mg total) by mouth daily. 12/02/17  Yes Armbruster, Carlota Raspberry, MD  oxyCODONE-acetaminophen (PERCOCET) 10-325 MG tablet Take 1 tablet by mouth 2 (two) times daily as needed. 12/24/17  Yes Isaac Bliss, Rayford Halsted, MD  Probiotic Product (PROBIOTIC ADVANCED PO) Take 1 tablet by mouth daily.   Yes [provider]  rOPINIRole (REQUIP) 2 MG tablet TAKE 1 TABLET BY MOUTH AT  BEDTIME Patient taking differently: Take 2 mg by mouth at bedtime.  10/18/17  Yes Dorena Cookey, MD  rosuvastatin (CRESTOR) 20 MG tablet TAKE 1 TABLET BY MOUTH ONCE DAILY Patient taking differently: Take 20 mg by mouth daily.  02/16/18  Yes Troy Sine, MD  venlafaxine (EFFEXOR) 75 MG tablet Take 1 tablet (75 mg total) by mouth daily. 12/31/17  Yes Erline Hau, MD  NEEDLE, DISP, 25 G (B-D DISP NEEDLE 25GX1") 25G X 1" MISC Inject 1000 mcg into muscle once a month. 12/24/17   Erline Hau, MD    Physical Exam: Vitals:   03/30/18 1607 03/30/18  1908 03/30/18 2328  BP: (!) 143/78 139/76 (!) 120/57  Pulse: 63 73 74  Resp: 20 18 18   Temp: 98.1 F (36.7 C)    TempSrc: Oral    SpO2: 98% 98% 94%    Constitutional: NAD, appears uncomfortable  Eyes: PERTLA, lids and conjunctivae normal ENMT: Mucous membranes are moist. Posterior pharynx clear of any exudate or lesions.   Neck: normal, supple, no masses, no thyromegaly Respiratory: clear to auscultation bilaterally, no wheezing, no crackles. Normal respiratory effort.   Cardiovascular: S1 & S2 heard, regular rate and rhythm. No extremity edema.  Abdomen: No distension, tender in epigastrium without rebound pain or guarding. Bowel sounds active.  Musculoskeletal: no clubbing / cyanosis. No joint deformity upper and lower extremities.   Skin: no significant rashes, lesions, ulcers. Warm, dry, well-perfused. Neurologic: No facial asymmtery. Sensation intact. Moving all extremities.  Psychiatric: Alert and oriented to person, place, and situation. Pleasant, cooperative.    Labs on Admission: I have personally reviewed following labs and imaging studies  CBC: Recent Labs  Lab 03/30/18 1616  WBC 21.4*  HGB 13.2  HCT 41.5  MCV 87.7  PLT 620   Basic Metabolic Panel: Recent Labs  Lab 03/30/18 1616  NA 139  K 4.2  CL 106  CO2 22  GLUCOSE 182*  BUN 20  CREATININE 1.59*  CALCIUM 8.9   GFR: CrCl cannot  be calculated (Unknown ideal weight.). Liver Function Tests: Recent Labs  Lab 03/30/18 1616  AST 70*  ALT 32  ALKPHOS 90  BILITOT 1.5*  PROT 7.6  ALBUMIN 3.8   Recent Labs  Lab 03/30/18 1616  LIPASE 390*   No results for input(s): AMMONIA in the last 168 hours. Coagulation Profile: No results for input(s): INR, PROTIME in the last 168 hours. Cardiac Enzymes: No results for input(s): CKTOTAL, CKMB, CKMBINDEX, TROPONINI in the last 168 hours. BNP (last 3 results) No results for input(s): PROBNP in the last 8760 hours. HbA1C: No results for input(s): HGBA1C in the last 72 hours. CBG: No results for input(s): GLUCAP in the last 168 hours. Lipid Profile: No results for input(s): CHOL, HDL, LDLCALC, TRIG, CHOLHDL, LDLDIRECT in the last 72 hours. Thyroid Function Tests: No results for input(s): TSH, T4TOTAL, FREET4, T3FREE, THYROIDAB in the last 72 hours. Anemia Panel: No results for input(s): VITAMINB12, FOLATE, FERRITIN, TIBC, IRON, RETICCTPCT in the last 72 hours. Urine analysis:    Component Value Date/Time   COLORURINE AMBER (A) 03/30/2018 2045   APPEARANCEUR CLEAR 03/30/2018 2045   LABSPEC 1.014 03/30/2018 2045   PHURINE 5.0 03/30/2018 2045   GLUCOSEU NEGATIVE 03/30/2018 2045   HGBUR NEGATIVE 03/30/2018 2045   BILIRUBINUR SMALL (A) 03/30/2018 2045   BILIRUBINUR 3 06/21/2015 0838   KETONESUR NEGATIVE 03/30/2018 2045   PROTEINUR 30 (A) 03/30/2018 2045   UROBILINOGEN 0.2 06/21/2015 0838   NITRITE NEGATIVE 03/30/2018 2045   LEUKOCYTESUR NEGATIVE 03/30/2018 2045   Sepsis Labs: @LABRCNTIP (procalcitonin:4,lacticidven:4) )No results found for this or any previous visit (from the past 240 hour(s)).   Radiological Exams on Admission: Ct Abdomen Pelvis W Contrast  Result Date: 03/30/2018 CLINICAL DATA:  78 y/o  M; generalized abdominal pain and tremors. EXAM: CT ABDOMEN AND PELVIS WITH CONTRAST TECHNIQUE: Multidetector CT imaging of the abdomen and pelvis was performed  using the standard protocol following bolus administration of intravenous contrast. CONTRAST:  24mL OMNIPAQUE IOHEXOL 300 MG/ML  SOLN COMPARISON:  01/05/2018 CT abdomen and pelvis FINDINGS: Lower chest: No acute abnormality. Hepatobiliary: No focal liver abnormality.  No gallbladder wall thickening or radiopaque gallstone identified within the gallbladder. Increased dilatation of the common bile duct measuring up to 15 mm at the liver hilum. The common bile duct near the ampulla is opacified (series 3, image 33 and series 6, image 66). Pancreas: Edema surrounding the pancreas compatible with acute pancreatitis. No findings of necrosis or acute peripancreatic collection at this time. No main duct dilatation. Spleen: Normal in size without focal abnormality. Adrenals/Urinary Tract: Adrenal glands are unremarkable. Kidneys are normal, without renal calculi, focal lesion, or hydronephrosis. Bladder is unremarkable. Stomach/Bowel: Stomach is within normal limits. Appendix not identified, no pericecal inflammation. No evidence of bowel wall thickening, distention, or inflammatory changes. Vascular/Lymphatic: Aortic atherosclerosis. No enlarged abdominal or pelvic lymph nodes. Reproductive: Prostate is unremarkable. Other: No abdominal wall hernia or abnormality. No abdominopelvic ascites. Musculoskeletal: No fracture is seen. IMPRESSION: 1. Findings compatible with acute pancreatitis. No necrosis or acute peripancreatic collection identified at this time. 2. Increased dilatation of the common bile duct measuring up to 15 mm at the liver hilum and opacified common bile duct near the ampulla. No main duct dilatation of the pancreas. Findings may represent stricture, obstructing mass, or choledocholithiasis. This can be further evaluated with MRI/MRCP without and with contrast. Electronically Signed   By: Kristine Garbe M.D.   On: 03/30/2018 23:08    EKG: Independently reviewed. Sinus rhythm.   Assessment/Plan    1. Acute pancreatitis; CBD dilatation with opacity near ampulla  - Presents with a week of intermittent upper abdominal pain and nausea that worsened acutely 2 days ago and has been severe, associated with shaking chills and nausea but no vomiting  - He is afebrile here with marked leukocytosis and elevated lactate  - Lipase is 390 and there is mild elevation in AST and total bilirubin  - CT abd/pelvis with acute uncomplicated pancreatitis and CBD dilatation with opacification of CBD near the ampulla  - Blood cultures collected in ED and he was treated with Rocephin, morphine, and IVF  - Continue IVF hydration, bowel-rest, and pain-control; check MRCP, trend lipase and LFT's - With shaking chills and marked leukocytosis, will continue empiric antibiotics with Zosyn for possible cholangitis   2. CAD  - No anginal complaints  - Resume asa, statin, and beta-blocker once appropriate for a diet    3. CKD III  - SCr is 1.59 on admission, similar to priors  - Renally-dose medications    4. Hyperglycemia  - No hx of DM, no A1c on file - Secondary to acute illness vs DM   - Check A1c, follow CBG's and use low-intensity SSI with Novolog if needed    DVT prophylaxis: SCD's  Code Status: Full  Family Communication: Discussed with patient  Consults called: None Admission status: Observation     Vianne Bulls, MD [facility] Pager [phone]  If 7PM-7AM, please contact night-coverage [email or web address] Password [facility]  03/30/2018, 11:53 PM

## 2018-03-30 NOTE — ED Triage Notes (Signed)
Pt reports generalized abd pain and tremors since this weekend. Denies n/v. Describes pain as stabbing. Pt a.o, tremoring during triage

## 2018-03-31 ENCOUNTER — Observation Stay (HOSPITAL_COMMUNITY): Payer: Medicare Other

## 2018-03-31 ENCOUNTER — Encounter (HOSPITAL_COMMUNITY): Payer: Self-pay

## 2018-03-31 DIAGNOSIS — R7881 Bacteremia: Secondary | ICD-10-CM | POA: Diagnosis present

## 2018-03-31 DIAGNOSIS — G2581 Restless legs syndrome: Secondary | ICD-10-CM | POA: Diagnosis present

## 2018-03-31 DIAGNOSIS — R1084 Generalized abdominal pain: Secondary | ICD-10-CM

## 2018-03-31 DIAGNOSIS — R933 Abnormal findings on diagnostic imaging of other parts of digestive tract: Secondary | ICD-10-CM | POA: Diagnosis not present

## 2018-03-31 DIAGNOSIS — K859 Acute pancreatitis without necrosis or infection, unspecified: Secondary | ICD-10-CM | POA: Diagnosis present

## 2018-03-31 DIAGNOSIS — R932 Abnormal findings on diagnostic imaging of liver and biliary tract: Secondary | ICD-10-CM | POA: Diagnosis not present

## 2018-03-31 DIAGNOSIS — K831 Obstruction of bile duct: Secondary | ICD-10-CM | POA: Diagnosis not present

## 2018-03-31 DIAGNOSIS — A419 Sepsis, unspecified organism: Secondary | ICD-10-CM | POA: Diagnosis not present

## 2018-03-31 DIAGNOSIS — I130 Hypertensive heart and chronic kidney disease with heart failure and stage 1 through stage 4 chronic kidney disease, or unspecified chronic kidney disease: Secondary | ICD-10-CM | POA: Diagnosis not present

## 2018-03-31 DIAGNOSIS — R8569 Abnormal cytological findings in specimens from other digestive organs and abdominal cavity: Secondary | ICD-10-CM | POA: Diagnosis not present

## 2018-03-31 DIAGNOSIS — Z951 Presence of aortocoronary bypass graft: Secondary | ICD-10-CM | POA: Diagnosis not present

## 2018-03-31 DIAGNOSIS — I5030 Unspecified diastolic (congestive) heart failure: Secondary | ICD-10-CM | POA: Diagnosis present

## 2018-03-31 DIAGNOSIS — N183 Chronic kidney disease, stage 3 (moderate): Secondary | ICD-10-CM | POA: Diagnosis not present

## 2018-03-31 DIAGNOSIS — R1013 Epigastric pain: Secondary | ICD-10-CM | POA: Diagnosis not present

## 2018-03-31 DIAGNOSIS — Z8551 Personal history of malignant neoplasm of bladder: Secondary | ICD-10-CM | POA: Diagnosis not present

## 2018-03-31 DIAGNOSIS — E119 Type 2 diabetes mellitus without complications: Secondary | ICD-10-CM | POA: Diagnosis not present

## 2018-03-31 DIAGNOSIS — Z7982 Long term (current) use of aspirin: Secondary | ICD-10-CM | POA: Diagnosis not present

## 2018-03-31 DIAGNOSIS — I2510 Atherosclerotic heart disease of native coronary artery without angina pectoris: Secondary | ICD-10-CM | POA: Diagnosis not present

## 2018-03-31 DIAGNOSIS — B3781 Candidal esophagitis: Secondary | ICD-10-CM | POA: Diagnosis not present

## 2018-03-31 DIAGNOSIS — G4733 Obstructive sleep apnea (adult) (pediatric): Secondary | ICD-10-CM | POA: Diagnosis present

## 2018-03-31 DIAGNOSIS — D72829 Elevated white blood cell count, unspecified: Secondary | ICD-10-CM | POA: Diagnosis not present

## 2018-03-31 DIAGNOSIS — R609 Edema, unspecified: Secondary | ICD-10-CM | POA: Diagnosis not present

## 2018-03-31 DIAGNOSIS — J984 Other disorders of lung: Secondary | ICD-10-CM | POA: Diagnosis not present

## 2018-03-31 DIAGNOSIS — K2950 Unspecified chronic gastritis without bleeding: Secondary | ICD-10-CM | POA: Diagnosis not present

## 2018-03-31 DIAGNOSIS — K810 Acute cholecystitis: Secondary | ICD-10-CM | POA: Diagnosis present

## 2018-03-31 DIAGNOSIS — A499 Bacterial infection, unspecified: Secondary | ICD-10-CM | POA: Diagnosis not present

## 2018-03-31 DIAGNOSIS — K851 Biliary acute pancreatitis without necrosis or infection: Secondary | ICD-10-CM | POA: Diagnosis not present

## 2018-03-31 DIAGNOSIS — I129 Hypertensive chronic kidney disease with stage 1 through stage 4 chronic kidney disease, or unspecified chronic kidney disease: Secondary | ICD-10-CM | POA: Diagnosis not present

## 2018-03-31 DIAGNOSIS — E1122 Type 2 diabetes mellitus with diabetic chronic kidney disease: Secondary | ICD-10-CM | POA: Diagnosis present

## 2018-03-31 DIAGNOSIS — K317 Polyp of stomach and duodenum: Secondary | ICD-10-CM | POA: Diagnosis not present

## 2018-03-31 DIAGNOSIS — Z87891 Personal history of nicotine dependence: Secondary | ICD-10-CM | POA: Diagnosis not present

## 2018-03-31 DIAGNOSIS — R109 Unspecified abdominal pain: Secondary | ICD-10-CM

## 2018-03-31 DIAGNOSIS — K838 Other specified diseases of biliary tract: Secondary | ICD-10-CM | POA: Diagnosis not present

## 2018-03-31 DIAGNOSIS — N189 Chronic kidney disease, unspecified: Secondary | ICD-10-CM | POA: Diagnosis not present

## 2018-03-31 DIAGNOSIS — Z8719 Personal history of other diseases of the digestive system: Secondary | ICD-10-CM | POA: Diagnosis not present

## 2018-03-31 DIAGNOSIS — N179 Acute kidney failure, unspecified: Secondary | ICD-10-CM | POA: Diagnosis present

## 2018-03-31 DIAGNOSIS — Z79899 Other long term (current) drug therapy: Secondary | ICD-10-CM | POA: Diagnosis not present

## 2018-03-31 DIAGNOSIS — E872 Acidosis: Secondary | ICD-10-CM | POA: Diagnosis present

## 2018-03-31 DIAGNOSIS — K8309 Other cholangitis: Secondary | ICD-10-CM | POA: Diagnosis not present

## 2018-03-31 DIAGNOSIS — K3189 Other diseases of stomach and duodenum: Secondary | ICD-10-CM | POA: Diagnosis not present

## 2018-03-31 DIAGNOSIS — J449 Chronic obstructive pulmonary disease, unspecified: Secondary | ICD-10-CM | POA: Diagnosis present

## 2018-03-31 DIAGNOSIS — E875 Hyperkalemia: Secondary | ICD-10-CM | POA: Diagnosis present

## 2018-03-31 DIAGNOSIS — Z01810 Encounter for preprocedural cardiovascular examination: Secondary | ICD-10-CM | POA: Diagnosis not present

## 2018-03-31 DIAGNOSIS — Z1612 Extended spectrum beta lactamase (ESBL) resistance: Secondary | ICD-10-CM | POA: Diagnosis present

## 2018-03-31 DIAGNOSIS — K219 Gastro-esophageal reflux disease without esophagitis: Secondary | ICD-10-CM | POA: Diagnosis present

## 2018-03-31 DIAGNOSIS — A4151 Sepsis due to Escherichia coli [E. coli]: Secondary | ICD-10-CM | POA: Diagnosis not present

## 2018-03-31 LAB — CBC WITH DIFFERENTIAL/PLATELET
Abs Immature Granulocytes: 0.08 10*3/uL — ABNORMAL HIGH (ref 0.00–0.07)
Basophils Absolute: 0 10*3/uL (ref 0.0–0.1)
Basophils Relative: 0 %
Eosinophils Absolute: 0 10*3/uL (ref 0.0–0.5)
Eosinophils Relative: 0 %
HCT: 36 % — ABNORMAL LOW (ref 39.0–52.0)
Hemoglobin: 11.7 g/dL — ABNORMAL LOW (ref 13.0–17.0)
Immature Granulocytes: 1 %
LYMPHS ABS: 0.7 10*3/uL (ref 0.7–4.0)
Lymphocytes Relative: 6 %
MCH: 28.4 pg (ref 26.0–34.0)
MCHC: 32.5 g/dL (ref 30.0–36.0)
MCV: 87.4 fL (ref 80.0–100.0)
Monocytes Absolute: 0.5 10*3/uL (ref 0.1–1.0)
Monocytes Relative: 4 %
NRBC: 0 % (ref 0.0–0.2)
Neutro Abs: 11.3 10*3/uL — ABNORMAL HIGH (ref 1.7–7.7)
Neutrophils Relative %: 89 %
PLATELETS: UNDETERMINED 10*3/uL (ref 150–400)
RBC: 4.12 MIL/uL — ABNORMAL LOW (ref 4.22–5.81)
RDW: 14.6 % (ref 11.5–15.5)
WBC: 12.6 10*3/uL — ABNORMAL HIGH (ref 4.0–10.5)

## 2018-03-31 LAB — BLOOD CULTURE ID PANEL (REFLEXED)
Acinetobacter baumannii: NOT DETECTED
CANDIDA TROPICALIS: NOT DETECTED
Candida albicans: NOT DETECTED
Candida glabrata: NOT DETECTED
Candida krusei: NOT DETECTED
Candida parapsilosis: NOT DETECTED
Carbapenem resistance: NOT DETECTED
Enterobacter cloacae complex: NOT DETECTED
Enterobacteriaceae species: DETECTED — AB
Enterococcus species: NOT DETECTED
Escherichia coli: DETECTED — AB
Haemophilus influenzae: NOT DETECTED
KLEBSIELLA OXYTOCA: NOT DETECTED
Klebsiella pneumoniae: NOT DETECTED
Listeria monocytogenes: NOT DETECTED
Neisseria meningitidis: NOT DETECTED
PROTEUS SPECIES: NOT DETECTED
Pseudomonas aeruginosa: NOT DETECTED
Serratia marcescens: NOT DETECTED
Staphylococcus aureus (BCID): NOT DETECTED
Staphylococcus species: NOT DETECTED
Streptococcus agalactiae: NOT DETECTED
Streptococcus pneumoniae: NOT DETECTED
Streptococcus pyogenes: NOT DETECTED
Streptococcus species: NOT DETECTED

## 2018-03-31 LAB — LIPID PANEL
Cholesterol: 80 mg/dL (ref 0–200)
HDL: 32 mg/dL — ABNORMAL LOW (ref >40)
LDL Cholesterol: 34 mg/dL (ref 0–99)
Total CHOL/HDL Ratio: 2.5 RATIO
Triglycerides: 72 mg/dL (ref <150)
VLDL: 14 mg/dL (ref 0–40)

## 2018-03-31 LAB — COMPREHENSIVE METABOLIC PANEL
ALT: 26 U/L (ref 0–44)
AST: 43 U/L — ABNORMAL HIGH (ref 15–41)
Albumin: 2.9 g/dL — ABNORMAL LOW (ref 3.5–5.0)
Alkaline Phosphatase: 65 U/L (ref 38–126)
Anion gap: 8 (ref 5–15)
BUN: 29 mg/dL — ABNORMAL HIGH (ref 8–23)
CALCIUM: 8.2 mg/dL — AB (ref 8.9–10.3)
CO2: 26 mmol/L (ref 22–32)
Chloride: 110 mmol/L (ref 98–111)
Creatinine, Ser: 1.93 mg/dL — ABNORMAL HIGH (ref 0.61–1.24)
GFR calc Af Amer: 38 mL/min — ABNORMAL LOW (ref >60)
GFR calc non Af Amer: 33 mL/min — ABNORMAL LOW (ref >60)
GLUCOSE: 124 mg/dL — AB (ref 70–99)
Potassium: 5.4 mmol/L — ABNORMAL HIGH (ref 3.5–5.1)
Sodium: 144 mmol/L (ref 135–145)
Total Bilirubin: 2.1 mg/dL — ABNORMAL HIGH (ref 0.3–1.2)
Total Protein: 6.1 g/dL — ABNORMAL LOW (ref 6.5–8.1)

## 2018-03-31 LAB — GLUCOSE, CAPILLARY
Glucose-Capillary: 119 mg/dL — ABNORMAL HIGH (ref 70–99)
Glucose-Capillary: 91 mg/dL (ref 70–99)
Glucose-Capillary: 98 mg/dL (ref 70–99)
Glucose-Capillary: 60 mg/dL — ABNORMAL LOW (ref 70–99)
Glucose-Capillary: 89 mg/dL (ref 70–99)
Glucose-Capillary: 151 mg/dL — ABNORMAL HIGH (ref 70–99)
Glucose-Capillary: 132 mg/dL — ABNORMAL HIGH (ref 70–99)

## 2018-03-31 LAB — LIPASE, BLOOD: Lipase: 1793 U/L — ABNORMAL HIGH (ref 11–51)

## 2018-03-31 LAB — INFLUENZA PANEL BY PCR (TYPE A & B)
Influenza A By PCR: NEGATIVE
Influenza B By PCR: NEGATIVE

## 2018-03-31 LAB — LACTIC ACID, PLASMA
LACTIC ACID, VENOUS: 2.2 mmol/L — AB (ref 0.5–1.9)
Lactic Acid, Venous: 1.7 mmol/L (ref 0.5–1.9)

## 2018-03-31 LAB — HEMOGLOBIN A1C
Hgb A1c MFr Bld: 7 % — ABNORMAL HIGH (ref 4.8–5.6)
Mean Plasma Glucose: 154.2 mg/dL

## 2018-03-31 MED ORDER — NEBIVOLOL HCL 10 MG PO TABS
10.0000 mg | ORAL_TABLET | Freq: Every day | ORAL | Status: DC
  Administered 2018-03-31 – 2018-04-06 (×6): 10 mg via ORAL
  Filled 2018-03-31 (×7): qty 1

## 2018-03-31 MED ORDER — PANTOPRAZOLE SODIUM 40 MG PO TBEC: 40.0000 mg | DELAYED_RELEASE_TABLET | Freq: Every day | ORAL | Status: DC

## 2018-03-31 MED ORDER — GADOBUTROL 1 MMOL/ML IV SOLN
8.4000 mL | Freq: Once | INTRAVENOUS | Status: AC | PRN
  Administered 2018-03-31: 8.4 mL via INTRAVENOUS

## 2018-03-31 MED ORDER — SODIUM CHLORIDE 0.9 % IV SOLN
2.0000 g | Freq: Once | INTRAVENOUS | Status: AC
  Administered 2018-03-31: 2 g via INTRAVENOUS
  Filled 2018-03-31: qty 2

## 2018-03-31 MED ORDER — SODIUM CHLORIDE 0.9 % IV SOLN
2.0000 g | INTRAVENOUS | Status: DC
  Administered 2018-03-31 – 2018-04-01 (×2): 2 g via INTRAVENOUS
  Filled 2018-03-31 (×2): qty 20

## 2018-03-31 MED ORDER — PHENOL 1.4 % MT LIQD
1.0000 | OROMUCOSAL | Status: DC | PRN
  Administered 2018-03-31 – 2018-04-01 (×2): 1 via OROMUCOSAL
  Filled 2018-03-31: qty 177

## 2018-03-31 MED ORDER — FENTANYL CITRATE (PF) 100 MCG/2ML IJ SOLN
50.0000 ug | Freq: Once | INTRAMUSCULAR | Status: AC
  Administered 2018-03-31: 50 ug via INTRAVENOUS

## 2018-03-31 MED ORDER — ENOXAPARIN SODIUM 40 MG/0.4ML ~~LOC~~ SOLN
40.0000 mg | SUBCUTANEOUS | Status: DC
  Administered 2018-03-31: 40 mg via SUBCUTANEOUS
  Filled 2018-03-31: qty 0.4

## 2018-03-31 MED ORDER — LACTATED RINGERS IV SOLN
INTRAVENOUS | Status: DC
  Administered 2018-03-31 – 2018-04-01 (×4): via INTRAVENOUS

## 2018-03-31 MED ORDER — FAMOTIDINE IN NACL 20-0.9 MG/50ML-% IV SOLN
20.0000 mg | Freq: Two times a day (BID) | INTRAVENOUS | Status: DC
  Administered 2018-03-31 (×2): 20 mg via INTRAVENOUS
  Filled 2018-03-31 (×3): qty 50

## 2018-03-31 MED ORDER — VANCOMYCIN HCL 10 G IV SOLR
1500.0000 mg | Freq: Once | INTRAVENOUS | Status: AC
  Administered 2018-03-31: 1500 mg via INTRAVENOUS
  Filled 2018-03-31: qty 1500

## 2018-03-31 MED ORDER — INDOMETHACIN 50 MG RE SUPP
100.0000 mg | Freq: Once | RECTAL | Status: DC
  Filled 2018-03-31: qty 2

## 2018-03-31 MED ORDER — SODIUM CHLORIDE 0.9 % IV SOLN
2.0000 g | INTRAVENOUS | Status: DC
  Filled 2018-03-31: qty 2

## 2018-03-31 MED ORDER — SODIUM CHLORIDE 0.9 % IV SOLN: 1000.0000 mL | INTRAVENOUS | Status: DC

## 2018-03-31 MED ORDER — PANTOPRAZOLE SODIUM 40 MG PO TBEC
40.0000 mg | DELAYED_RELEASE_TABLET | Freq: Two times a day (BID) | ORAL | Status: DC
  Administered 2018-03-31: 40 mg via ORAL
  Filled 2018-03-31: qty 1

## 2018-03-31 MED ORDER — ISOSORBIDE MONONITRATE ER 60 MG PO TB24
60.0000 mg | ORAL_TABLET | Freq: Every day | ORAL | Status: DC
  Administered 2018-03-31 – 2018-04-06 (×6): 60 mg via ORAL
  Filled 2018-03-31 (×6): qty 1

## 2018-03-31 MED ORDER — ALBUTEROL SULFATE (2.5 MG/3ML) 0.083% IN NEBU
2.5000 mg | INHALATION_SOLUTION | Freq: Four times a day (QID) | RESPIRATORY_TRACT | Status: DC | PRN
  Administered 2018-04-01 – 2018-04-04 (×3): 2.5 mg via RESPIRATORY_TRACT
  Filled 2018-03-31 (×3): qty 3

## 2018-03-31 MED ORDER — FOLIC ACID 1 MG PO TABS
500.0000 ug | ORAL_TABLET | Freq: Every day | ORAL | Status: DC
  Administered 2018-03-31 – 2018-04-06 (×6): 0.5 mg via ORAL
  Filled 2018-03-31 (×6): qty 1

## 2018-03-31 NOTE — Consult Note (Signed)
[facility] Consult: 11:25 AM 03/31/2018  LOS: 0 days    Referring Provider: Dr Harvest Forest  Primary Care Physician:  Isaac Bliss, Rayford Halsted, MD Primary Gastroenterologist:  Dr. Havery Moros.       Reason for Consultation:  Acute pancreatitis, ? CBD stricture?   HPI: ETHERIDGE GEIL is a 78 y.o. male.  PMH CAD, status post CABG 2001.  Recurrent bladder cancer.  HTN.  OSA, COPD not on oxygen.  Stage 3 CKD.   GI hx of Barrett's esophagus, adenomatous polyps.   12/2017 EGD.  For surveillance of Barrett's and c/o dysphagia.  Changes in the distal esophagus visually appeared as Barrett's, (path: Mild inflammation c/w GER.  No goblet cell metaplasia, dysplasia, malignancy).  Tortuous esophagus.  Mild distal esophageal stenosis was balloon dilated.  Small sessile polyp (hyperplastic) in the gastric cardia.   Remainder of stomach and duodenum to D2 were normal 12/2017 Colonoscopy.  3 mm, sessile, polyp at descending colon (tubular adenoma).  A few sigmoid and ascending diverticula.  Due to severe angulation at the IC valve, the distal ileum was not intubated despite multiple attempts.   Nothing on colonoscopy or EGD explain patient's complaint of right lower quadrant and groin pain 12/2017 CT abdomen/pelvis without contrast for evaluation of the pain was very normal.  Approximately 10 days ago, on a Sunday patient developed pain in the upper abdomen/epigastric region.  At times this radiated down into the lower abdomen and bilateral groins.  He has had similar pain before but this was more intense and more focused in the epigastric region.  PO intake triggered nausea but no vomiting, had dry heaves on 1 or 2 occasions.  + chills, rigors, sweats.  Has not been eating that much.  Neither bismuth or oxycodone provided  relief.. Patient has been compliant with generic omeprazole 40 mg daily but he says he gets breakthrough reflux on the generic and that the brand name omeprazole works better for him.  Has not had recurrent dysphagia.  Last med change occurred in 11/2017 when he was started on low-dose HCTZ  Finally came to the ED yesterday. Lipase 1793.  T bili 1.5.  Alk phos 90.  AST/ALT 70/32. WBC 21.4.  Elevated lactic acid.  Potassium 5.4  03/30/2018 CT abdomen pelvis with contrast shows acute pancreatitis.  CBD 15 mm diameter with opacification at the CBD near the ampulla raising question of stricture versus stone versus mass. 03/31/2018 MR/MRCP shows mild, acute, uncomplicated pancreatitis.  Gallbladder distended with mild peri-cholecystic fluid but but no wall thickening..  Intra-and extrahepatic ducts dilated.  Question distal CBD stricture but no choledocholithiasis.  Pancreatic duct normal.  Changes possibly reflecting peptic ulcer disease or gastritis in the stomach.  Patient does not consume alcoholic beverages and no prior history of significant EtOH use. No family history of issues with the gallbladder, pancreas. Patient has been a widower for 7 years, his wife died in a motor vehicle accident.  Past Medical History:  Diagnosis Date   Allergy    B12 deficiency anemia    Blood  transfusion without reported diagnosis    CAD (coronary artery disease)    Colon polyps    COPD (chronic obstructive pulmonary disease) (HCC)    Depression    Esophagus, Barrett's    GERD (gastroesophageal reflux disease)    History of bladder cancer    Bladder cancer "8 times"   Hyperlipidemia    Hypertension    Localized osteoarthrosis, lower leg    Restless leg syndrome    Sleep apnea    Stenosis of esophagus     Past Surgical History:  Procedure Laterality Date   BACK SURGERY     bladder cancer     CERVICAL DISCECTOMY     ACDF   COLONOSCOPY  11/17/2005   normal    CORONARY ARTERY  BYPASS GRAFT     x4   ESOPHAGOGASTRODUODENOSCOPY  04/29/2010   I&D EXTREMITY Left 11/24/2016   Procedure: IRRIGATION AND DEBRIDEMENT LEFT HAND, THUMB, INDEX, MIDDLE, RING, AND SMALL FINGERS WITH RECONSTRUCTION;  Surgeon: Roseanne Kaufman, MD;  Location: [facility];  Service: Orthopedics;  Laterality: Left;   KNEE ARTHROSCOPY     LUMBAR LAMINECTOMY     and fusion   NASAL SINUS SURGERY     POPLITEAL SYNOVIAL CYST EXCISION      Prior to Admission medications   Medication Sig Start Date End Date Taking? Authorizing Provider  acetaminophen (TYLENOL) 325 MG tablet Take 650 mg by mouth every 6 (six) hours as needed for mild pain.   Yes [provider]  aspirin 81 MG tablet Take 81 mg by mouth daily.     Yes [provider]  BYSTOLIC 10 MG tablet TAKE ONE TABLET BY MOUTH ONCE DAILY Patient taking differently: Take 10 mg by mouth daily.  03/29/17  Yes Troy Sine, MD  cyanocobalamin (,VITAMIN B-12,) 1000 MCG/ML injection INJECT 1 ML (CC) INTRAMUSCULARLY FOR 1 DOSE Patient taking differently: Inject 1,000 mcg into the muscle once.  12/24/17  Yes Isaac Bliss, Rayford Halsted, MD  dicyclomine (BENTYL) 10 MG capsule Take 1 capsule (10 mg total) by mouth every 8 (eight) hours as needed for spasms. 01/07/18  Yes Armbruster, Carlota Raspberry, MD  etodolac (LODINE) 300 MG capsule TAKE 1 CAPSULE BY MOUTH EVERY 8 HOURS Patient taking differently: Take 300 mg by mouth every 8 (eight) hours.  03/10/18  Yes Isaac Bliss, Rayford Halsted, MD  ezetimibe (ZETIA) 10 MG tablet TAKE 1 TABLET BY MOUTH ONCE DAILY Patient taking differently: Take 10 mg by mouth daily.  09/07/17  Yes Marletta Lor, MD  folic acid (FOLVITE) 034 MCG tablet Take 400 mcg by mouth daily.     Yes [provider]  gabapentin (NEURONTIN) 600 MG tablet Take 1 tablet (600 mg total) by mouth 3 (three) times daily. 11/13/15  Yes Jamse Arn, MD  hydrochlorothiazide (MICROZIDE) 12.5 MG capsule Take 1 capsule (12.5 mg total)  by mouth daily. 12/13/17 03/30/18 Yes Troy Sine, MD  isosorbide mononitrate (IMDUR) 60 MG 24 hr tablet TAKE 1 TABLET BY MOUTH ONCE DAILY Patient taking differently: Take 60 mg by mouth daily.  07/14/17  Yes Troy Sine, MD  nitroGLYCERIN (NITROSTAT) 0.4 MG SL tablet Place 1 tablet (0.4 mg total) under the tongue every 5 (five) minutes as needed for chest pain. 01/04/18  Yes Isaac Bliss, Rayford Halsted, MD  omeprazole (PRILOSEC) 40 MG capsule Take 1 capsule (40 mg total) by mouth daily. 12/02/17  Yes Armbruster, Carlota Raspberry, MD  oxyCODONE-acetaminophen (PERCOCET) 10-325 MG tablet Take 1 tablet by  mouth 2 (two) times daily as needed. 12/24/17  Yes Isaac Bliss, Rayford Halsted, MD  Probiotic Product (PROBIOTIC ADVANCED PO) Take 1 tablet by mouth daily.   Yes [provider]  rOPINIRole (REQUIP) 2 MG tablet TAKE 1 TABLET BY MOUTH AT BEDTIME Patient taking differently: Take 2 mg by mouth at bedtime.  10/18/17  Yes Dorena Cookey, MD  rosuvastatin (CRESTOR) 20 MG tablet TAKE 1 TABLET BY MOUTH ONCE DAILY Patient taking differently: Take 20 mg by mouth daily.  02/16/18  Yes Troy Sine, MD  venlafaxine (EFFEXOR) 75 MG tablet Take 1 tablet (75 mg total) by mouth daily. 12/31/17  Yes Erline Hau, MD  NEEDLE, DISP, 25 G (B-D DISP NEEDLE 25GX1") 25G X 1" MISC Inject 1000 mcg into muscle once a month. 12/24/17   Isaac Bliss, Rayford Halsted, MD    Scheduled Meds:  insulin aspart  0-9 Units Subcutaneous Q4H   Infusions:  sodium chloride 1,000 mL (03/30/18 2332)   ceFEPime (MAXIPIME) IV     famotidine (PEPCID) IV 20 mg (03/31/18 1027)   metronidazole 500 mg (03/31/18 0825)   PRN Meds: acetaminophen **OR** acetaminophen, albuterol, fentaNYL (SUBLIMAZE) injection, hydrALAZINE, ondansetron **OR** ondansetron (ZOFRAN) IV   Allergies as of 03/30/2018 - Review Complete 03/30/2018  Allergen Reaction Noted   Losartan potassium Other (See Comments) 06/16/2017   Tape Rash  11/16/2014    Family History  Problem Relation Age of Onset   Melanoma Mother    Stroke Father    Hypertension Father    Coronary artery disease Other    Colon cancer Neg Hx    Esophageal cancer Neg Hx    Stomach cancer Neg Hx    Rectal cancer Neg Hx     Social History   Socioeconomic History   Marital status: Married    Spouse name: Not on file   Number of children: Not on file   Years of education: Not on file   Highest education level: Not on file  Occupational History   Not on file  Social Needs   Financial resource strain: Not on file   Food insecurity:    Worry: Not on file    Inability: Not on file   Transportation needs:    Medical: Not on file    Non-medical: Not on file  Tobacco Use   Smoking status: Former Smoker    Last attempt to quit: 03/30/1976    Years since quitting: 42.0   Smokeless tobacco: Never Used   Tobacco comment: quit in 1978  Substance and Sexual Activity   Alcohol use: No    Alcohol/week: 0.0 standard drinks   Drug use: No   Sexual activity: Yes  Lifestyle   Physical activity:    Days per week: Not on file    Minutes per session: Not on file   Stress: Not on file  Relationships   Social connections:    Talks on phone: Not on file    Gets together: Not on file    Attends religious service: Not on file    Active member of club or organization: Not on file    Attends meetings of clubs or organizations: Not on file    Relationship status: Not on file   Intimate partner violence:    Fear of current or ex partner: Not on file    Emotionally abused: Not on file    Physically abused: Not on file    Forced sexual activity: Not on file  Other Topics Concern   Not on file  Social History Narrative   Not on file    REVIEW OF SYSTEMS: Constitutional: Generally patient is quite functional at home in the last week or so he been weak and tired. ENT:  No nose bleeds Pulm: Able chronic dyspnea with heavier  exertion.  Cough. CV:  No palpitations, no LE edema.  Chest pain GU:  No hematuria, no frequency GI:  Per HPI Heme: Denies unusual or excessive bleeding or bruising. Transfusions: Because having been transfused around the time of his bypass surgery Neuro:  No headaches, no peripheral tingling or numbness Derm:  No itching, no rash or sores.  Endocrine:  No sweats or chills.  No polyuria or dysuria Immunization: Vaccination history reviewed.  He is up-to-date on his flu shot. Travel:  None beyond local counties in last few months.    PHYSICAL EXAM: Vital signs in last 24 hours: Vitals:   03/31/18 0103 03/31/18 0519  BP: 128/70 (!) 143/71  Pulse: 83 70  Resp: 18 18  Temp: 98.3 F (36.8 C) 98.9 F (37.2 C)  SpO2: 95% 97%   Wt Readings from Last 3 Encounters:  03/31/18 84.4 kg  01/04/18 86.6 kg  12/29/17 88.5 kg    General: Pleasant, non-ill-appearing elderly WM.  Comfortable, sitting up in bed. Head: No facial swelling or asymmetry. Eyes: No scleral icterus, no conjunctival pallor. Ears: Not hard of hearing Nose: No congestion or discharge Mouth: Oropharynx moist, clear, pink.  Tongue midline. Neck: No JVD, no masses, no thyromegaly. Lungs: Clear but reduced breath sounds bilaterally.  No labored breathing cough. Heart: RRR.  No MRG.  S1-S2 present.  Well-healed sternal incision scar. Abdomen: Soft.  Slight tenderness across the upper abdomen without guarding or rebound.  Bowel sounds normal but hypoactive.  No HSM, masses, bruits, hernias..   Rectal: Deferred. Musc/Skeltl: No joint redness, swelling Extremities: No CCE. Neurologic: Alert.  Appropriate.  Moves all 4 limbs without tremors.  No signs of restless leg presently.  Fully oriented. Skin: No rash, no sores, no jaundice. Tattoos: 1 tattoo on upper left arm (placed ~age 29) Nodes: No cervical adenopathy. Psych:, Pleasant, cooperative.  Fluid speech.  Intake/Output from previous day: 03/11 0701 - 03/12 0700 In:  685.3 [I.V.:335.3; IV Piggyback:350] Out: 325 [Urine:325] Intake/Output this shift: No intake/output data recorded.  LAB RESULTS: Recent Labs    03/30/18 1616 03/31/18 0508  WBC 21.4* 12.6*  HGB 13.2 11.7*  HCT 41.5 36.0*  PLT 186 PLATELET CLUMPS NOTED ON SMEAR, UNABLE TO ESTIMATE   BMET Lab Results  Component Value Date   NA 144 03/31/2018   NA 139 03/30/2018   NA 141 12/24/2017   K 5.4 (H) 03/31/2018   K 4.2 03/30/2018   K 4.6 12/24/2017   CL 110 03/31/2018   CL 106 03/30/2018   CL 100 12/24/2017   CO2 26 03/31/2018   CO2 22 03/30/2018   CO2 25 12/24/2017   GLUCOSE 124 (H) 03/31/2018   GLUCOSE 182 (H) 03/30/2018   GLUCOSE 117 (H) 12/24/2017   BUN 29 (H) 03/31/2018   BUN 20 03/30/2018   BUN 25 12/24/2017   CREATININE 1.93 (H) 03/31/2018   CREATININE 1.59 (H) 03/30/2018   CREATININE 1.61 (H) 12/24/2017   CALCIUM 8.2 (L) 03/31/2018   CALCIUM 8.9 03/30/2018   CALCIUM 9.2 12/24/2017   LFT Recent Labs    03/30/18 1616 03/31/18 0508  PROT 7.6 6.1*  ALBUMIN 3.8 2.9*  AST 70* 43*  ALT  32 26  ALKPHOS 90 65  BILITOT 1.5* 2.1*   PT/INR Lab Results  Component Value Date   INR 1.0 RATIO 04/28/2006   Hepatitis Panel No results for input(s): HEPBSAG, HCVAB, HEPAIGM, HEPBIGM in the last 72 hours. C-Diff No components found for: CDIFF Lipase     Component Value Date/Time   LIPASE 1,793 (H) 03/31/2018 1610    Drugs of Abuse  No results found for: LABOPIA, COCAINSCRNUR, LABBENZ, AMPHETMU, THCU, LABBARB   RADIOLOGY STUDIES: Ct Abdomen Pelvis W Contrast  Result Date: 03/30/2018 CLINICAL DATA:  77 y/o  M; generalized abdominal pain and tremors. EXAM: CT ABDOMEN AND PELVIS WITH CONTRAST TECHNIQUE: Multidetector CT imaging of the abdomen and pelvis was performed using the standard protocol following bolus administration of intravenous contrast. CONTRAST:  59m OMNIPAQUE IOHEXOL 300 MG/ML  SOLN COMPARISON:  01/05/2018 CT abdomen and pelvis FINDINGS: Lower chest: No  acute abnormality. Hepatobiliary: No focal liver abnormality. No gallbladder wall thickening or radiopaque gallstone identified within the gallbladder. Increased dilatation of the common bile duct measuring up to 15 mm at the liver hilum. The common bile duct near the ampulla is opacified (series 3, image 33 and series 6, image 66). Pancreas: Edema surrounding the pancreas compatible with acute pancreatitis. No findings of necrosis or acute peripancreatic collection at this time. No main duct dilatation. Spleen: Normal in size without focal abnormality. Adrenals/Urinary Tract: Adrenal glands are unremarkable. Kidneys are normal, without renal calculi, focal lesion, or hydronephrosis. Bladder is unremarkable. Stomach/Bowel: Stomach is within normal limits. Appendix not identified, no pericecal inflammation. No evidence of bowel wall thickening, distention, or inflammatory changes. Vascular/Lymphatic: Aortic atherosclerosis. No enlarged abdominal or pelvic lymph nodes. Reproductive: Prostate is unremarkable. Other: No abdominal wall hernia or abnormality. No abdominopelvic ascites. Musculoskeletal: No fracture is seen. IMPRESSION: 1. Findings compatible with acute pancreatitis. No necrosis or acute peripancreatic collection identified at this time. 2. Increased dilatation of the common bile duct measuring up to 15 mm at the liver hilum and opacified common bile duct near the ampulla. No main duct dilatation of the pancreas. Findings may represent stricture, obstructing mass, or choledocholithiasis. This can be further evaluated with MRI/MRCP without and with contrast. Electronically Signed   By: LKristine GarbeM.D.   On: 03/30/2018 23:08   Mr 3d Recon At Scanner  Result Date: 03/31/2018 CLINICAL DATA:  Acute pancreatitis. EXAM: MRI ABDOMEN WITHOUT AND WITH CONTRAST (INCLUDING MRCP) TECHNIQUE: Multiplanar multisequence MR imaging of the abdomen was performed both before and after the administration of  intravenous contrast. Heavily T2-weighted images of the biliary and pancreatic ducts were obtained, and three-dimensional MRCP images were rendered by post processing. CONTRAST:  8.4 cc Gadavist COMPARISON:  CT scan 03/30/2018 FINDINGS: Lower chest: The lung bases are clear of an acute process. The heart is normal in size. No pericardial effusion. Hepatobiliary: No focal hepatic lesions. Mild stable intrahepatic and extrahepatic biliary dilatation when compared to CT scan from 2019. The gallbladder is distended. No significant gallbladder wall thickening but there is mild pericholecystic fluid. Small gallstones suspected in the gallbladder neck. Intra and extrahepatic biliary dilatation. No common bile duct stones are identified. No ampullary mass or pancreatic head mass. There could be a distal stricture. This appears to be chronic. Pancreas: Mild peripancreatic inflammatory changes with a small amount of fluid in the anterior pararenal spaces. Normal pancreatic enhancement and normal caliber and course of the main pancreatic duct. Findings consistent with mild uncomplicated pancreatitis as seen on the CT scan. Spleen:  Normal  size.  No focal lesions. Adrenals/Urinary Tract: The adrenal glands and kidneys are unremarkable. Stomach/Bowel: The gastric wall in the body and antral regions appears somewhat thickened and edematous. There is also moderate enhancement. Could not exclude peptic ulcer disease/gastritis. The visualized small bowel and colon are unremarkable. Vascular/Lymphatic: No pathologically enlarged lymph nodes identified. No abdominal aortic aneurysm demonstrated. Small scattered nodes, likely inflammatory. Other:  Small amount of free fluid but no overt ascites. Musculoskeletal: No significant findings. IMPRESSION: 1. MR findings consistent with mild acute uncomplicated pancreatitis. 2. Mild gallbladder distension and mild pericholecystic fluid but no gallbladder wall thickening or other definite  findings for acute cholecystitis. 3. Stable appearing intra and extrahepatic biliary dilatation. Possible distal stricture. No common bile duct stones are identified. Normal main pancreatic duct. 4. Possible inflammatory changes involving the stomach which could suggest peptic ulcer disease or gastritis. Electronically Signed   By: Marijo Sanes M.D.   On: 03/31/2018 08:43   Mr Abdomen Mrcp Moise Boring Contast  Result Date: 03/31/2018 CLINICAL DATA:  Acute pancreatitis. EXAM: MRI ABDOMEN WITHOUT AND WITH CONTRAST (INCLUDING MRCP) TECHNIQUE: Multiplanar multisequence MR imaging of the abdomen was performed both before and after the administration of intravenous contrast. Heavily T2-weighted images of the biliary and pancreatic ducts were obtained, and three-dimensional MRCP images were rendered by post processing. CONTRAST:  8.4 cc Gadavist COMPARISON:  CT scan 03/30/2018 FINDINGS: Lower chest: The lung bases are clear of an acute process. The heart is normal in size. No pericardial effusion. Hepatobiliary: No focal hepatic lesions. Mild stable intrahepatic and extrahepatic biliary dilatation when compared to CT scan from 2019. The gallbladder is distended. No significant gallbladder wall thickening but there is mild pericholecystic fluid. Small gallstones suspected in the gallbladder neck. Intra and extrahepatic biliary dilatation. No common bile duct stones are identified. No ampullary mass or pancreatic head mass. There could be a distal stricture. This appears to be chronic. Pancreas: Mild peripancreatic inflammatory changes with a small amount of fluid in the anterior pararenal spaces. Normal pancreatic enhancement and normal caliber and course of the main pancreatic duct. Findings consistent with mild uncomplicated pancreatitis as seen on the CT scan. Spleen:  Normal size.  No focal lesions. Adrenals/Urinary Tract: The adrenal glands and kidneys are unremarkable. Stomach/Bowel: The gastric wall in the body and  antral regions appears somewhat thickened and edematous. There is also moderate enhancement. Could not exclude peptic ulcer disease/gastritis. The visualized small bowel and colon are unremarkable. Vascular/Lymphatic: No pathologically enlarged lymph nodes identified. No abdominal aortic aneurysm demonstrated. Small scattered nodes, likely inflammatory. Other:  Small amount of free fluid but no overt ascites. Musculoskeletal: No significant findings. IMPRESSION: 1. MR findings consistent with mild acute uncomplicated pancreatitis. 2. Mild gallbladder distension and mild pericholecystic fluid but no gallbladder wall thickening or other definite findings for acute cholecystitis. 3. Stable appearing intra and extrahepatic biliary dilatation. Possible distal stricture. No common bile duct stones are identified. Normal main pancreatic duct. 4. Possible inflammatory changes involving the stomach which could suggest peptic ulcer disease or gastritis. Electronically Signed   By: Marijo Sanes M.D.   On: 03/31/2018 08:43     IMPRESSION:   *     Acute pancreatitis.  Dilated intrahepatic ducts and CBD.  ? CBD stricture.   *    Meets sepsis criteria.  ? Cholangitis.  Cefepime. Metronidazole in place day 2.  WBCs improved  *     Hyperkalemia.  *     AKI.  Baseline stage 3 CKD.  PLAN:     *   Set ERCP up for 0930 tomorrow with Dr Rush Landmark.  Pt may also need upper EUS but will add this once Dr Rush Landmark has evaluated case.    *   Continue abx.   ak to have clears if tolerated.  NPO after midnight.     Azucena Freed  03/31/2018, 11:25 AM Phone [phone]

## 2018-03-31 NOTE — H&P (View-Only) (Signed)
[facility] Consult: 11:25 AM 03/31/2018  LOS: 0 days    Referring Provider: Dr Harvest Forest  Primary Care Physician:  Isaac Bliss, Rayford Halsted, MD Primary Gastroenterologist:  Dr. Havery Moros.       Reason for Consultation:  Acute pancreatitis, ? CBD stricture?   HPI: [name] is a 78 y.o. male.  PMH CAD, status post CABG 2001.  Recurrent bladder cancer.  HTN.  OSA, COPD not on oxygen.  Stage 3 CKD.   GI hx of Barrett's esophagus, adenomatous polyps.   12/2017 EGD.  For surveillance of Barrett's and c/o dysphagia.  Changes in the distal esophagus visually appeared as Barrett's, (path: Mild inflammation c/w GER.  No goblet cell metaplasia, dysplasia, malignancy).  Tortuous esophagus.  Mild distal esophageal stenosis was balloon dilated.  Small sessile polyp (hyperplastic) in the gastric cardia.   Remainder of stomach and duodenum to D2 were normal 12/2017 Colonoscopy.  3 mm, sessile, polyp at descending colon (tubular adenoma).  A few sigmoid and ascending diverticula.  Due to severe angulation at the IC valve, the distal ileum was not intubated despite multiple attempts.   Nothing on colonoscopy or EGD explain patient's complaint of right lower quadrant and groin pain 12/2017 CT abdomen/pelvis without contrast for evaluation of the pain was very normal.  Approximately 10 days ago, on a Sunday patient developed pain in the upper abdomen/epigastric region.  At times this radiated down into the lower abdomen and bilateral groins.  He has had similar pain before but this was more intense and more focused in the epigastric region.  PO intake triggered nausea but no vomiting, had dry heaves on 1 or 2 occasions.  + chills, rigors, sweats.  Has not been eating that much.  Neither bismuth or oxycodone provided  relief.. Patient has been compliant with generic omeprazole 40 mg daily but he says he gets breakthrough reflux on the generic and that the brand name omeprazole works better for him.  Has not had recurrent dysphagia.  Last med change occurred in 11/2017 when he was started on low-dose HCTZ  Finally came to the ED yesterday. Lipase 1793.  T bili 1.5.  Alk phos 90.  AST/ALT 70/32. WBC 21.4.  Elevated lactic acid.  Potassium 5.4  03/30/2018 CT abdomen pelvis with contrast shows acute pancreatitis.  CBD 15 mm diameter with opacification at the CBD near the ampulla raising question of stricture versus stone versus mass. 03/31/2018 MR/MRCP shows mild, acute, uncomplicated pancreatitis.  Gallbladder distended with mild peri-cholecystic fluid but but no wall thickening..  Intra-and extrahepatic ducts dilated.  Question distal CBD stricture but no choledocholithiasis.  Pancreatic duct normal.  Changes possibly reflecting peptic ulcer disease or gastritis in the stomach.  Patient does not consume alcoholic beverages and no prior history of significant EtOH use. No family history of issues with the gallbladder, pancreas. Patient has been a widower for 7 years, his wife died in a motor vehicle accident.  Past Medical History:  Diagnosis Date   Allergy    B12 deficiency anemia    Blood  transfusion without reported diagnosis    CAD (coronary artery disease)    Colon polyps    COPD (chronic obstructive pulmonary disease) (HCC)    Depression    Esophagus, Barrett's    GERD (gastroesophageal reflux disease)    History of bladder cancer    Bladder cancer "8 times"   Hyperlipidemia    Hypertension    Localized osteoarthrosis, lower leg    Restless leg syndrome    Sleep apnea    Stenosis of esophagus     Past Surgical History:  Procedure Laterality Date   BACK SURGERY     bladder cancer     CERVICAL DISCECTOMY     ACDF   COLONOSCOPY  11/17/2005   normal    CORONARY ARTERY  BYPASS GRAFT     x4   ESOPHAGOGASTRODUODENOSCOPY  04/29/2010   I&D EXTREMITY Left 11/24/2016   Procedure: IRRIGATION AND DEBRIDEMENT LEFT HAND, THUMB, INDEX, MIDDLE, RING, AND SMALL FINGERS WITH RECONSTRUCTION;  Surgeon: Roseanne Kaufman, MD;  Location: [facility];  Service: Orthopedics;  Laterality: Left;   KNEE ARTHROSCOPY     LUMBAR LAMINECTOMY     and fusion   NASAL SINUS SURGERY     POPLITEAL SYNOVIAL CYST EXCISION      Prior to Admission medications   Medication Sig Start Date End Date Taking? Authorizing Provider  acetaminophen (TYLENOL) 325 MG tablet Take 650 mg by mouth every 6 (six) hours as needed for mild pain.   Yes [provider]  aspirin 81 MG tablet Take 81 mg by mouth daily.     Yes [provider]  BYSTOLIC 10 MG tablet TAKE ONE TABLET BY MOUTH ONCE DAILY Patient taking differently: Take 10 mg by mouth daily.  03/29/17  Yes Troy Sine, MD  cyanocobalamin (,VITAMIN B-12,) 1000 MCG/ML injection INJECT 1 ML (CC) INTRAMUSCULARLY FOR 1 DOSE Patient taking differently: Inject 1,000 mcg into the muscle once.  12/24/17  Yes Isaac Bliss, Rayford Halsted, MD  dicyclomine (BENTYL) 10 MG capsule Take 1 capsule (10 mg total) by mouth every 8 (eight) hours as needed for spasms. 01/07/18  Yes Armbruster, Carlota Raspberry, MD  etodolac (LODINE) 300 MG capsule TAKE 1 CAPSULE BY MOUTH EVERY 8 HOURS Patient taking differently: Take 300 mg by mouth every 8 (eight) hours.  03/10/18  Yes Isaac Bliss, Rayford Halsted, MD  ezetimibe (ZETIA) 10 MG tablet TAKE 1 TABLET BY MOUTH ONCE DAILY Patient taking differently: Take 10 mg by mouth daily.  09/07/17  Yes Marletta Lor, MD  folic acid (FOLVITE) 034 MCG tablet Take 400 mcg by mouth daily.     Yes [provider]  gabapentin (NEURONTIN) 600 MG tablet Take 1 tablet (600 mg total) by mouth 3 (three) times daily. 11/13/15  Yes Jamse Arn, MD  hydrochlorothiazide (MICROZIDE) 12.5 MG capsule Take 1 capsule (12.5 mg total)  by mouth daily. 12/13/17 03/30/18 Yes Troy Sine, MD  isosorbide mononitrate (IMDUR) 60 MG 24 hr tablet TAKE 1 TABLET BY MOUTH ONCE DAILY Patient taking differently: Take 60 mg by mouth daily.  07/14/17  Yes Troy Sine, MD  nitroGLYCERIN (NITROSTAT) 0.4 MG SL tablet Place 1 tablet (0.4 mg total) under the tongue every 5 (five) minutes as needed for chest pain. 01/04/18  Yes Isaac Bliss, Rayford Halsted, MD  omeprazole (PRILOSEC) 40 MG capsule Take 1 capsule (40 mg total) by mouth daily. 12/02/17  Yes Armbruster, Carlota Raspberry, MD  oxyCODONE-acetaminophen (PERCOCET) 10-325 MG tablet Take 1 tablet by  mouth 2 (two) times daily as needed. 12/24/17  Yes Isaac Bliss, Rayford Halsted, MD  Probiotic Product (PROBIOTIC ADVANCED PO) Take 1 tablet by mouth daily.   Yes [provider]  rOPINIRole (REQUIP) 2 MG tablet TAKE 1 TABLET BY MOUTH AT BEDTIME Patient taking differently: Take 2 mg by mouth at bedtime.  10/18/17  Yes Dorena Cookey, MD  rosuvastatin (CRESTOR) 20 MG tablet TAKE 1 TABLET BY MOUTH ONCE DAILY Patient taking differently: Take 20 mg by mouth daily.  02/16/18  Yes Troy Sine, MD  venlafaxine (EFFEXOR) 75 MG tablet Take 1 tablet (75 mg total) by mouth daily. 12/31/17  Yes Erline Hau, MD  NEEDLE, DISP, 25 G (B-D DISP NEEDLE 25GX1") 25G X 1" MISC Inject 1000 mcg into muscle once a month. 12/24/17   Isaac Bliss, Rayford Halsted, MD    Scheduled Meds:  insulin aspart  0-9 Units Subcutaneous Q4H   Infusions:  sodium chloride 1,000 mL (03/30/18 2332)   ceFEPime (MAXIPIME) IV     famotidine (PEPCID) IV 20 mg (03/31/18 1027)   metronidazole 500 mg (03/31/18 0825)   PRN Meds: acetaminophen **OR** acetaminophen, albuterol, fentaNYL (SUBLIMAZE) injection, hydrALAZINE, ondansetron **OR** ondansetron (ZOFRAN) IV   Allergies as of 03/30/2018 - Review Complete 03/30/2018  Allergen Reaction Noted   Losartan potassium Other (See Comments) 06/16/2017   Tape Rash  11/16/2014    Family History  Problem Relation Age of Onset   Melanoma Mother    Stroke Father    Hypertension Father    Coronary artery disease Other    Colon cancer Neg Hx    Esophageal cancer Neg Hx    Stomach cancer Neg Hx    Rectal cancer Neg Hx     Social History   Socioeconomic History   Marital status: Married    Spouse name: Not on file   Number of children: Not on file   Years of education: Not on file   Highest education level: Not on file  Occupational History   Not on file  Social Needs   Financial resource strain: Not on file   Food insecurity:    Worry: Not on file    Inability: Not on file   Transportation needs:    Medical: Not on file    Non-medical: Not on file  Tobacco Use   Smoking status: Former Smoker    Last attempt to quit: 03/30/1976    Years since quitting: 42.0   Smokeless tobacco: Never Used   Tobacco comment: quit in 1978  Substance and Sexual Activity   Alcohol use: No    Alcohol/week: 0.0 standard drinks   Drug use: No   Sexual activity: Yes  Lifestyle   Physical activity:    Days per week: Not on file    Minutes per session: Not on file   Stress: Not on file  Relationships   Social connections:    Talks on phone: Not on file    Gets together: Not on file    Attends religious service: Not on file    Active member of club or organization: Not on file    Attends meetings of clubs or organizations: Not on file    Relationship status: Not on file   Intimate partner violence:    Fear of current or ex partner: Not on file    Emotionally abused: Not on file    Physically abused: Not on file    Forced sexual activity: Not on file  Other Topics Concern   Not on file  Social History Narrative   Not on file    REVIEW OF SYSTEMS: Constitutional: Generally patient is quite functional at home in the last week or so he been weak and tired. ENT:  No nose bleeds Pulm: Able chronic dyspnea with heavier  exertion.  Cough. CV:  No palpitations, no LE edema.  Chest pain GU:  No hematuria, no frequency GI:  Per HPI Heme: Denies unusual or excessive bleeding or bruising. Transfusions: Because having been transfused around the time of his bypass surgery Neuro:  No headaches, no peripheral tingling or numbness Derm:  No itching, no rash or sores.  Endocrine:  No sweats or chills.  No polyuria or dysuria Immunization: Vaccination history reviewed.  He is up-to-date on his flu shot. Travel:  None beyond local counties in last few months.    PHYSICAL EXAM: Vital signs in last 24 hours: Vitals:   03/31/18 0103 03/31/18 0519  BP: 128/70 (!) 143/71  Pulse: 83 70  Resp: 18 18  Temp: 98.3 F (36.8 C) 98.9 F (37.2 C)  SpO2: 95% 97%   Wt Readings from Last 3 Encounters:  03/31/18 84.4 kg  01/04/18 86.6 kg  12/29/17 88.5 kg    General: Pleasant, non-ill-appearing elderly WM.  Comfortable, sitting up in bed. Head: No facial swelling or asymmetry. Eyes: No scleral icterus, no conjunctival pallor. Ears: Not hard of hearing Nose: No congestion or discharge Mouth: Oropharynx moist, clear, pink.  Tongue midline. Neck: No JVD, no masses, no thyromegaly. Lungs: Clear but reduced breath sounds bilaterally.  No labored breathing cough. Heart: RRR.  No MRG.  S1-S2 present.  Well-healed sternal incision scar. Abdomen: Soft.  Slight tenderness across the upper abdomen without guarding or rebound.  Bowel sounds normal but hypoactive.  No HSM, masses, bruits, hernias..   Rectal: Deferred. Musc/Skeltl: No joint redness, swelling Extremities: No CCE. Neurologic: Alert.  Appropriate.  Moves all 4 limbs without tremors.  No signs of restless leg presently.  Fully oriented. Skin: No rash, no sores, no jaundice. Tattoos: 1 tattoo on upper left arm (placed ~age 29) Nodes: No cervical adenopathy. Psych:, Pleasant, cooperative.  Fluid speech.  Intake/Output from previous day: 03/11 0701 - 03/12 0700 In:  685.3 [I.V.:335.3; IV Piggyback:350] Out: 325 [Urine:325] Intake/Output this shift: No intake/output data recorded.  LAB RESULTS: Recent Labs    03/30/18 1616 03/31/18 0508  WBC 21.4* 12.6*  HGB 13.2 11.7*  HCT 41.5 36.0*  PLT 186 PLATELET CLUMPS NOTED ON SMEAR, UNABLE TO ESTIMATE   BMET Lab Results  Component Value Date   NA 144 03/31/2018   NA 139 03/30/2018   NA 141 12/24/2017   K 5.4 (H) 03/31/2018   K 4.2 03/30/2018   K 4.6 12/24/2017   CL 110 03/31/2018   CL 106 03/30/2018   CL 100 12/24/2017   CO2 26 03/31/2018   CO2 22 03/30/2018   CO2 25 12/24/2017   GLUCOSE 124 (H) 03/31/2018   GLUCOSE 182 (H) 03/30/2018   GLUCOSE 117 (H) 12/24/2017   BUN 29 (H) 03/31/2018   BUN 20 03/30/2018   BUN 25 12/24/2017   CREATININE 1.93 (H) 03/31/2018   CREATININE 1.59 (H) 03/30/2018   CREATININE 1.61 (H) 12/24/2017   CALCIUM 8.2 (L) 03/31/2018   CALCIUM 8.9 03/30/2018   CALCIUM 9.2 12/24/2017   LFT Recent Labs    03/30/18 1616 03/31/18 0508  PROT 7.6 6.1*  ALBUMIN 3.8 2.9*  AST 70* 43*  ALT  32 26  ALKPHOS 90 65  BILITOT 1.5* 2.1*   PT/INR Lab Results  Component Value Date   INR 1.0 RATIO 04/28/2006   Hepatitis Panel No results for input(s): HEPBSAG, HCVAB, HEPAIGM, HEPBIGM in the last 72 hours. C-Diff No components found for: CDIFF Lipase     Component Value Date/Time   LIPASE 1,793 (H) 03/31/2018 1610    Drugs of Abuse  No results found for: LABOPIA, COCAINSCRNUR, LABBENZ, AMPHETMU, THCU, LABBARB   RADIOLOGY STUDIES: Ct Abdomen Pelvis W Contrast  Result Date: 03/30/2018 CLINICAL DATA:  77 y/o  M; generalized abdominal pain and tremors. EXAM: CT ABDOMEN AND PELVIS WITH CONTRAST TECHNIQUE: Multidetector CT imaging of the abdomen and pelvis was performed using the standard protocol following bolus administration of intravenous contrast. CONTRAST:  59m OMNIPAQUE IOHEXOL 300 MG/ML  SOLN COMPARISON:  01/05/2018 CT abdomen and pelvis FINDINGS: Lower chest: No  acute abnormality. Hepatobiliary: No focal liver abnormality. No gallbladder wall thickening or radiopaque gallstone identified within the gallbladder. Increased dilatation of the common bile duct measuring up to 15 mm at the liver hilum. The common bile duct near the ampulla is opacified (series 3, image 33 and series 6, image 66). Pancreas: Edema surrounding the pancreas compatible with acute pancreatitis. No findings of necrosis or acute peripancreatic collection at this time. No main duct dilatation. Spleen: Normal in size without focal abnormality. Adrenals/Urinary Tract: Adrenal glands are unremarkable. Kidneys are normal, without renal calculi, focal lesion, or hydronephrosis. Bladder is unremarkable. Stomach/Bowel: Stomach is within normal limits. Appendix not identified, no pericecal inflammation. No evidence of bowel wall thickening, distention, or inflammatory changes. Vascular/Lymphatic: Aortic atherosclerosis. No enlarged abdominal or pelvic lymph nodes. Reproductive: Prostate is unremarkable. Other: No abdominal wall hernia or abnormality. No abdominopelvic ascites. Musculoskeletal: No fracture is seen. IMPRESSION: 1. Findings compatible with acute pancreatitis. No necrosis or acute peripancreatic collection identified at this time. 2. Increased dilatation of the common bile duct measuring up to 15 mm at the liver hilum and opacified common bile duct near the ampulla. No main duct dilatation of the pancreas. Findings may represent stricture, obstructing mass, or choledocholithiasis. This can be further evaluated with MRI/MRCP without and with contrast. Electronically Signed   By: LKristine GarbeM.D.   On: 03/30/2018 23:08   Mr 3d Recon At Scanner  Result Date: 03/31/2018 CLINICAL DATA:  Acute pancreatitis. EXAM: MRI ABDOMEN WITHOUT AND WITH CONTRAST (INCLUDING MRCP) TECHNIQUE: Multiplanar multisequence MR imaging of the abdomen was performed both before and after the administration of  intravenous contrast. Heavily T2-weighted images of the biliary and pancreatic ducts were obtained, and three-dimensional MRCP images were rendered by post processing. CONTRAST:  8.4 cc Gadavist COMPARISON:  CT scan 03/30/2018 FINDINGS: Lower chest: The lung bases are clear of an acute process. The heart is normal in size. No pericardial effusion. Hepatobiliary: No focal hepatic lesions. Mild stable intrahepatic and extrahepatic biliary dilatation when compared to CT scan from 2019. The gallbladder is distended. No significant gallbladder wall thickening but there is mild pericholecystic fluid. Small gallstones suspected in the gallbladder neck. Intra and extrahepatic biliary dilatation. No common bile duct stones are identified. No ampullary mass or pancreatic head mass. There could be a distal stricture. This appears to be chronic. Pancreas: Mild peripancreatic inflammatory changes with a small amount of fluid in the anterior pararenal spaces. Normal pancreatic enhancement and normal caliber and course of the main pancreatic duct. Findings consistent with mild uncomplicated pancreatitis as seen on the CT scan. Spleen:  Normal  size.  No focal lesions. Adrenals/Urinary Tract: The adrenal glands and kidneys are unremarkable. Stomach/Bowel: The gastric wall in the body and antral regions appears somewhat thickened and edematous. There is also moderate enhancement. Could not exclude peptic ulcer disease/gastritis. The visualized small bowel and colon are unremarkable. Vascular/Lymphatic: No pathologically enlarged lymph nodes identified. No abdominal aortic aneurysm demonstrated. Small scattered nodes, likely inflammatory. Other:  Small amount of free fluid but no overt ascites. Musculoskeletal: No significant findings. IMPRESSION: 1. MR findings consistent with mild acute uncomplicated pancreatitis. 2. Mild gallbladder distension and mild pericholecystic fluid but no gallbladder wall thickening or other definite  findings for acute cholecystitis. 3. Stable appearing intra and extrahepatic biliary dilatation. Possible distal stricture. No common bile duct stones are identified. Normal main pancreatic duct. 4. Possible inflammatory changes involving the stomach which could suggest peptic ulcer disease or gastritis. Electronically Signed   By: Marijo Sanes M.D.   On: 03/31/2018 08:43   Mr Abdomen Mrcp Moise Boring Contast  Result Date: 03/31/2018 CLINICAL DATA:  Acute pancreatitis. EXAM: MRI ABDOMEN WITHOUT AND WITH CONTRAST (INCLUDING MRCP) TECHNIQUE: Multiplanar multisequence MR imaging of the abdomen was performed both before and after the administration of intravenous contrast. Heavily T2-weighted images of the biliary and pancreatic ducts were obtained, and three-dimensional MRCP images were rendered by post processing. CONTRAST:  8.4 cc Gadavist COMPARISON:  CT scan 03/30/2018 FINDINGS: Lower chest: The lung bases are clear of an acute process. The heart is normal in size. No pericardial effusion. Hepatobiliary: No focal hepatic lesions. Mild stable intrahepatic and extrahepatic biliary dilatation when compared to CT scan from 2019. The gallbladder is distended. No significant gallbladder wall thickening but there is mild pericholecystic fluid. Small gallstones suspected in the gallbladder neck. Intra and extrahepatic biliary dilatation. No common bile duct stones are identified. No ampullary mass or pancreatic head mass. There could be a distal stricture. This appears to be chronic. Pancreas: Mild peripancreatic inflammatory changes with a small amount of fluid in the anterior pararenal spaces. Normal pancreatic enhancement and normal caliber and course of the main pancreatic duct. Findings consistent with mild uncomplicated pancreatitis as seen on the CT scan. Spleen:  Normal size.  No focal lesions. Adrenals/Urinary Tract: The adrenal glands and kidneys are unremarkable. Stomach/Bowel: The gastric wall in the body and  antral regions appears somewhat thickened and edematous. There is also moderate enhancement. Could not exclude peptic ulcer disease/gastritis. The visualized small bowel and colon are unremarkable. Vascular/Lymphatic: No pathologically enlarged lymph nodes identified. No abdominal aortic aneurysm demonstrated. Small scattered nodes, likely inflammatory. Other:  Small amount of free fluid but no overt ascites. Musculoskeletal: No significant findings. IMPRESSION: 1. MR findings consistent with mild acute uncomplicated pancreatitis. 2. Mild gallbladder distension and mild pericholecystic fluid but no gallbladder wall thickening or other definite findings for acute cholecystitis. 3. Stable appearing intra and extrahepatic biliary dilatation. Possible distal stricture. No common bile duct stones are identified. Normal main pancreatic duct. 4. Possible inflammatory changes involving the stomach which could suggest peptic ulcer disease or gastritis. Electronically Signed   By: Marijo Sanes M.D.   On: 03/31/2018 08:43     IMPRESSION:   *     Acute pancreatitis.  Dilated intrahepatic ducts and CBD.  ? CBD stricture.   *    Meets sepsis criteria.  ? Cholangitis.  Cefepime. Metronidazole in place day 2.  WBCs improved  *     Hyperkalemia.  *     AKI.  Baseline stage 3 CKD.  PLAN:     *   Set ERCP up for 0930 tomorrow with Dr Rush Landmark.  Pt may also need upper EUS but will add this once Dr Rush Landmark has evaluated case.    *   Continue abx.   ak to have clears if tolerated.  NPO after midnight.     Azucena Freed  03/31/2018, 11:25 AM Phone [phone]

## 2018-03-31 NOTE — Progress Notes (Signed)
CRITICAL VALUE ALERT  Critical Value:  Lactic Acid 2.2  Date & Time Notied:  03/31/18 @ 06:28  Provider Notified: Baltazar Najjar   Orders Received/Actions taken: n/a

## 2018-03-31 NOTE — ED Notes (Signed)
ED TO INPATIENT HANDOFF REPORT  ED Nurse Name and Phone #:  Myriam Jacobson [phone]  S Name/Age/Gender [name] 78 y.o. male Room/Bed: 028C/028C  Code Status   Code Status: Full Code  Home/SNF/Other Home Patient oriented to: self, place, time and situation Is this baseline? Yes   Triage Complete: Triage complete  Chief Complaint ABD Pain   Triage Note Pt reports generalized abd pain and tremors since this weekend. Denies n/v. Describes pain as stabbing. Pt a.o, tremoring during triage   Allergies Allergies  Allergen Reactions  . Losartan Potassium Other (See Comments)    Hyperkalemia  . Tape Rash    Itching, reddened skin Itching, reddened skin    Level of Care/Admitting Diagnosis ED Disposition    ED Disposition Condition [facility] Area: [place] [100100]  Level of Care: Med-Surg [16]  I expect the patient will be discharged within 24 hours: No (not a candidate for 5C-Observation unit)  Diagnosis: Acute biliary pancreatitis [588502]  Admitting Physician: Vianne Bulls [[id number]]  Attending Physician: Vianne Bulls [[id number]]  PT Class (Do Not Modify): Observation [104]  PT Acc Code (Do Not Modify): Observation [10022]       B Medical/Surgery History Past Medical History:  Diagnosis Date  . Allergy   . Anemia   . B12 deficiency anemia   . Blood transfusion without reported diagnosis   . CAD (coronary artery disease)   . Cancer ([place])    bladder cancer 8 times  . Colon polyps   . COPD (chronic obstructive pulmonary disease) ([place])   . Depression   . Esophagitis   . Esophagus, Barrett's   . GERD (gastroesophageal reflux disease)   . History of bladder cancer   . Hyperlipidemia   . Hypertension   . Hypothyroidism    pt denies   . Localized osteoarthrosis, lower leg   . Low back pain   . Malaise and fatigue   . Sleep apnea   . Stenosis of esophagus    Past Surgical History:  Procedure Laterality Date  .  bladder cancer    . CERVICAL DISCECTOMY     ACDF  . COLONOSCOPY  11/17/2005   normal   . CORONARY ARTERY BYPASS GRAFT     x4  . ESOPHAGOGASTRODUODENOSCOPY  04/29/2010  . I&D EXTREMITY Left 11/24/2016   Procedure: IRRIGATION AND DEBRIDEMENT LEFT HAND, THUMB, INDEX, MIDDLE, RING, AND SMALL FINGERS WITH RECONSTRUCTION;  Surgeon: Roseanne Kaufman, MD;  Location: [facility];  Service: Orthopedics;  Laterality: Left;  . KNEE ARTHROSCOPY    . LUMBAR LAMINECTOMY     and fusion  . NASAL SINUS SURGERY    . POPLITEAL SYNOVIAL CYST EXCISION       A IV Location/Drains/Wounds Patient Lines/Drains/Airways Status   Active Line/Drains/Airways    Name:   Placement date:   Placement time:   Site:   Days:   Peripheral IV 03/30/18 Right;Anterior Forearm   03/30/18    2211    Forearm   1   External Urinary Catheter   11/24/16    2345    -   492   Airway   11/24/16    1957     492   Incision (Closed) 11/24/16 Arm Left   11/24/16    2125     492   Wound / Incision (Open or Dehisced) 11/24/16 Laceration;Puncture Hand Left all four fingers and thumb cut by tablesaw blade   11/24/16  1700    Hand   492          Intake/Output Last 24 hours No intake or output data in the 24 hours ending 03/31/18 0011  Labs/Imaging Results for orders placed or performed during the hospital encounter of 03/30/18 (from the past 48 hour(s))  Lipase, blood     Status: Abnormal   Collection Time: 03/30/18  4:16 PM  Result Value Ref Range   Lipase 390 (H) 11 - 51 U/L    Comment: Performed at [facility], [place] [address] [place], [place] [address]  Comprehensive metabolic panel     Status: Abnormal   Collection Time: 03/30/18  4:16 PM  Result Value Ref Range   Sodium 139 135 - 145 mmol/L   Potassium 4.2 3.5 - 5.1 mmol/L   Chloride 106 98 - 111 mmol/L   CO2 22 22 - 32 mmol/L   Glucose, Bld 182 (H) 70 - 99 mg/dL   BUN 20 8 - 23 mg/dL   Creatinine, Ser 1.59 (H) 0.61 - 1.24 mg/dL   Calcium 8.9 8.9 - 10.3 mg/dL   Total  Protein 7.6 6.5 - 8.1 g/dL   Albumin 3.8 3.5 - 5.0 g/dL   AST 70 (H) 15 - 41 U/L   ALT 32 0 - 44 U/L   Alkaline Phosphatase 90 38 - 126 U/L   Total Bilirubin 1.5 (H) 0.3 - 1.2 mg/dL   GFR calc non Af Amer 42 (L) >60 mL/min   GFR calc Af Amer 48 (L) >60 mL/min   Anion gap 11 5 - 15    Comment: Performed at [facility] [address], [place], [place] [address]  CBC     Status: Abnormal   Collection Time: 03/30/18  4:16 PM  Result Value Ref Range   WBC 21.4 (H) 4.0 - 10.5 K/uL   RBC 4.73 4.22 - 5.81 MIL/uL   Hemoglobin 13.2 13.0 - 17.0 g/dL   HCT 41.5 39.0 - 52.0 %   MCV 87.7 80.0 - 100.0 fL   MCH 27.9 26.0 - 34.0 pg   MCHC 31.8 30.0 - 36.0 g/dL   RDW 14.1 11.5 - 15.5 %   Platelets 186 150 - 400 K/uL   nRBC 0.0 0.0 - 0.2 %    Comment: Performed at [facility], [place] [address], [place], [place] [address]  I-Stat Troponin, ED (not at [facility])     Status: None   Collection Time: 03/30/18  4:29 PM  Result Value Ref Range   Troponin i, poc 0.05 0.00 - 0.08 ng/mL   Comment 3            Comment: Due to the release kinetics of cTnI, a negative result within the first hours of the onset of symptoms does not rule out myocardial infarction with certainty. If myocardial infarction is still suspected, repeat the test at appropriate intervals.   Urinalysis, Routine w reflex microscopic     Status: Abnormal   Collection Time: 03/30/18  8:45 PM  Result Value Ref Range   Color, Urine AMBER (A) YELLOW    Comment: BIOCHEMICALS MAY BE AFFECTED BY COLOR   APPearance CLEAR CLEAR   Specific Gravity, Urine 1.014 1.005 - 1.030   pH 5.0 5.0 - 8.0   Glucose, UA NEGATIVE NEGATIVE mg/dL   Hgb urine dipstick NEGATIVE NEGATIVE   Bilirubin Urine SMALL (A) NEGATIVE   Ketones, ur NEGATIVE NEGATIVE mg/dL   Protein, ur 30 (A) NEGATIVE  mg/dL   Nitrite NEGATIVE NEGATIVE   Leukocytes,Ua NEGATIVE NEGATIVE   RBC / HPF 0-5 0 - 5 RBC/hpf   WBC, UA 0-5 0 - 5 WBC/hpf   Bacteria, UA RARE (A) NONE SEEN    Squamous Epithelial / LPF 0-5 0 - 5   Mucus PRESENT     Comment: Performed at [facility], [place] [address], [place], [place] [address]  Lactic acid, plasma     Status: Abnormal   Collection Time: 03/30/18 10:12 PM  Result Value Ref Range   Lactic Acid, Venous 2.4 (HH) 0.5 - 1.9 mmol/L    Comment: CRITICAL RESULT CALLED TO, READ BACK BY AND VERIFIED WITH: WOODRUF C,RN 03/30/18 2241 WAYK Performed at [facility], [place] [address], [place], [place] [address]    Ct Abdomen Pelvis W Contrast  Result Date: 03/30/2018 CLINICAL DATA:  78 y/o  M; generalized abdominal pain and tremors. EXAM: CT ABDOMEN AND PELVIS WITH CONTRAST TECHNIQUE: Multidetector CT imaging of the abdomen and pelvis was performed using the standard protocol following bolus administration of intravenous contrast. CONTRAST:  69mL OMNIPAQUE IOHEXOL 300 MG/ML  SOLN COMPARISON:  01/05/2018 CT abdomen and pelvis FINDINGS: Lower chest: No acute abnormality. Hepatobiliary: No focal liver abnormality. No gallbladder wall thickening or radiopaque gallstone identified within the gallbladder. Increased dilatation of the common bile duct measuring up to 15 mm at the liver hilum. The common bile duct near the ampulla is opacified (series 3, image 33 and series 6, image 66). Pancreas: Edema surrounding the pancreas compatible with acute pancreatitis. No findings of necrosis or acute peripancreatic collection at this time. No main duct dilatation. Spleen: Normal in size without focal abnormality. Adrenals/Urinary Tract: Adrenal glands are unremarkable. Kidneys are normal, without renal calculi, focal lesion, or hydronephrosis. Bladder is unremarkable. Stomach/Bowel: Stomach is within normal limits. Appendix not identified, no pericecal inflammation. No evidence of bowel wall thickening, distention, or inflammatory changes. Vascular/Lymphatic: Aortic atherosclerosis. No enlarged abdominal or pelvic lymph nodes. Reproductive: Prostate is  unremarkable. Other: No abdominal wall hernia or abnormality. No abdominopelvic ascites. Musculoskeletal: No fracture is seen. IMPRESSION: 1. Findings compatible with acute pancreatitis. No necrosis or acute peripancreatic collection identified at this time. 2. Increased dilatation of the common bile duct measuring up to 15 mm at the liver hilum and opacified common bile duct near the ampulla. No main duct dilatation of the pancreas. Findings may represent stricture, obstructing mass, or choledocholithiasis. This can be further evaluated with MRI/MRCP without and with contrast. Electronically Signed   By: Kristine Garbe M.D.   On: 03/30/2018 23:08    Pending Labs Unresulted Labs (From admission, onward)    Start     Ordered   03/31/18 0500  Comprehensive metabolic panel  Tomorrow morning,   R     03/30/18 2353   03/31/18 0500  Lipase, blood  Tomorrow morning,   R     03/30/18 2353   03/31/18 0500  CBC WITH DIFFERENTIAL  Tomorrow morning,   R     03/30/18 2353   03/30/18 2353  Lactic acid, plasma  Now then every 3 hours,   STAT     03/30/18 2353   03/30/18 2350  Hemoglobin A1c  Once,   R    Comments:  To assess prior glycemic control    03/30/18 2353   03/30/18 2156  Culture, blood (routine x 2)  BLOOD CULTURE X 2,   STAT     03/30/18 2155   Unscheduled  Protime-INR  With next blood draw,   R     03/30/18 2353   Unscheduled  Lactate dehydrogenase  With next blood draw,   R     03/30/18 2355          Vitals/Pain Today's Vitals   03/30/18 1607 03/30/18 1612 03/30/18 1908 03/30/18 2328  BP: (!) 143/78  139/76 (!) 120/57  Pulse: 63  73 74  Resp: 20  18 18   Temp: 98.1 F (36.7 C)     TempSrc: Oral     SpO2: 98%  98% 94%  PainSc:  10-Worst pain ever      Isolation Precautions No active isolations  Medications Medications  sodium chloride 0.9 % bolus 1,000 mL (1,000 mLs Intravenous New Bag/Given 03/30/18 2349)    Followed by  0.9 %  sodium chloride infusion (1,000  mLs Intravenous New Bag/Given 03/30/18 2332)  insulin aspart (novoLOG) injection 0-9 Units (has no administration in time range)  0.9 %  sodium chloride infusion (has no administration in time range)  acetaminophen (TYLENOL) tablet 650 mg (has no administration in time range)    Or  acetaminophen (TYLENOL) suppository 650 mg (has no administration in time range)  ondansetron (ZOFRAN) tablet 4 mg (has no administration in time range)    Or  ondansetron (ZOFRAN) injection 4 mg (has no administration in time range)  fentaNYL (SUBLIMAZE) injection 25-50 mcg (has no administration in time range)  hydrALAZINE (APRESOLINE) injection 10 mg (has no administration in time range)  metroNIDAZOLE (FLAGYL) IVPB 500 mg (has no administration in time range)  sodium chloride flush (NS) 0.9 % injection 3 mL (3 mLs Intravenous Given 03/30/18 2341)  cefTRIAXone (ROCEPHIN) 1 g in sodium chloride 0.9 % 100 mL IVPB (1 g Intravenous New Bag/Given 03/30/18 2333)  iohexol (OMNIPAQUE) 300 MG/ML solution 80 mL (80 mLs Intravenous Contrast Given 03/30/18 2233)  morphine 4 MG/ML injection 4 mg (4 mg Intravenous Given 03/30/18 2348)  pantoprazole (PROTONIX) injection 40 mg (40 mg Intravenous Given 03/30/18 2338)    Mobility walks Low fall risk   Focused Assessments    R Recommendations: See Admitting Provider Note  Report given to:   Additional Notes:  Pt to have MRA done, last ate around noon yesterday, is not claustrophobic

## 2018-03-31 NOTE — Progress Notes (Signed)
Pharmacy Antibiotic Note  [name] is a 78 y.o. male admitted on 03/30/2018 with pancreatitis w/ leukocytosis.  Pharmacy has been consulted for cefepime dosing.  Plan: Cefepime 2g IV Q24H.  Height: 5\' 9"  (175.3 cm) Weight: 186 lb (84.4 kg) IBW/kg (Calculated) : 70.7  Temp (24hrs), Avg:98.1 F (36.7 C), Min:98.1 F (36.7 C), Max:98.1 F (36.7 C)  Recent Labs  Lab 03/30/18 1616 03/30/18 2212  WBC 21.4*  --   CREATININE 1.59*  --   LATICACIDVEN  --  2.4*    Estimated Creatinine Clearance: 40.1 mL/min (A) (by C-G formula based on SCr of 1.59 mg/dL (H)).    Allergies  Allergen Reactions  . Losartan Potassium Other (See Comments)    Hyperkalemia  . Tape Rash    Itching, reddened skin Itching, reddened skin    Thank you for allowing pharmacy to be a part of this patient's care.  Wynona Neat, PharmD, BCPS  03/31/2018 12:18 AM

## 2018-03-31 NOTE — Progress Notes (Signed)
PHARMACY - PHYSICIAN COMMUNICATION CRITICAL VALUE ALERT - BLOOD CULTURE IDENTIFICATION (BCID)  [name] is an 78 y.o. male who presented to [facility] on 03/30/2018 with a chief complaint of abdominal pain.  Assessment:  His blood cultures are positive from Gram negative rods with E coli detected on BCID. He is covered with Cefepime, and can narrow to Ceftriaxone. Since he has intra-abdominal pathology continuing anaerobic coverage with Metronidazole is a reasonable plan.  Name of physician (or Provider) Contacted: Fuller Plan, MD  Current antibiotics: Cefepime and Metronidazole  Changes to prescribed antibiotics recommended:  Change Cefepime to Ceftriaxone 2g IV q24h Continue Metronidazole  Results for orders placed or performed during the hospital encounter of 03/30/18  Blood Culture ID Panel (Reflexed) (Collected: 03/30/2018 10:12 PM)  Result Value Ref Range   Enterococcus species NOT DETECTED NOT DETECTED   Listeria monocytogenes NOT DETECTED NOT DETECTED   Staphylococcus species NOT DETECTED NOT DETECTED   Staphylococcus aureus (BCID) NOT DETECTED NOT DETECTED   Streptococcus species NOT DETECTED NOT DETECTED   Streptococcus agalactiae NOT DETECTED NOT DETECTED   Streptococcus pneumoniae NOT DETECTED NOT DETECTED   Streptococcus pyogenes NOT DETECTED NOT DETECTED   Acinetobacter baumannii NOT DETECTED NOT DETECTED   Enterobacteriaceae species DETECTED (A) NOT DETECTED   Enterobacter cloacae complex NOT DETECTED NOT DETECTED   Escherichia coli DETECTED (A) NOT DETECTED   Klebsiella oxytoca NOT DETECTED NOT DETECTED   Klebsiella pneumoniae NOT DETECTED NOT DETECTED   Proteus species NOT DETECTED NOT DETECTED   Serratia marcescens NOT DETECTED NOT DETECTED   Carbapenem resistance NOT DETECTED NOT DETECTED   Haemophilus influenzae NOT DETECTED NOT DETECTED   Neisseria meningitidis NOT DETECTED NOT DETECTED   Pseudomonas aeruginosa NOT DETECTED NOT DETECTED   Candida  albicans NOT DETECTED NOT DETECTED   Candida glabrata NOT DETECTED NOT DETECTED   Candida krusei NOT DETECTED NOT DETECTED   Candida parapsilosis NOT DETECTED NOT DETECTED   Candida tropicalis NOT DETECTED NOT DETECTED   Legrand Como, Pharm.D., BCPS, BCIDP Clinical Pharmacist Phone: [phone] Please check AMION for all [place] numbers 03/31/2018, 2:22 PM

## 2018-03-31 NOTE — Progress Notes (Addendum)
Progress Note    [name]  QMV:[id number] DOB: Nov 08, 1940  DOA: 03/30/2018 PCP: [name], Jacob Halsted, MD    Brief Narrative:   Chief complaint: Abdominal pain   Medical records reviewed and are as summarized below:  [name] is an 78 y.o. male with pmh of CAD, bladder cancer, HTN, Barrett's esophagus, esophageal stenosis s/p dilation, hiatal hernia, colon polyps, and GERD; who presents with complaints of epigastric abdominal pain most notably after eating.  Patient found to have significant signs of pancreatitis with of the common bile dilatation.  Assessment/Plan:   Principal Problem:   Acute biliary pancreatitis Active Problems:   Essential hypertension   CKD (chronic kidney disease), stage III (HCC)   Leukocytosis   CAD (coronary artery disease)   Diabetes mellitus type 2, noninsulin dependent ([place])   Pancreatitis   Sepsis ([place])   Bacteremia due to Gram-negative bacteria  Sepsis 2/2 bacteremia: Acute and present on admission.  Patient found to have elevated lactic acid of 2.4 with white blood cell count elevated up to 21.4 on admission.  Unclear source but patient complained of fever chills, rigors for which patient was empirically started on cefepime and metronidazole. -Blood culture resulted positive for gram-negative rods, will follow-up for further classification and sensitivity -Continue cefepime and metronidazole -Trend lactic acid levels -Addendum: Recurrent fevers up to 102 F this afternoon.  Patient given one-time dose of vancomycin and check influenza screen given diarrhea. -May warrant formal consult in a.m. to infectious disease  Acute pancreatitis, elevated liver enzyme: Lipase elevated 390-> 1793 with mild elevation in AST and total bilirubin.  CT scan showing acute pancreatitis and increased dilatation of the common bile duct measuring up to 15 mm at the liver hilum and opacification of the common bile near the ampulla.  Patient was  started on oral/IV fluids and MRCP revealed signs of possible distal biliary stenosis. -Clear liquid diet as tolerated -Increase IV fluids to lactated Ringer's at 200 ml/hr per GI -Continue fentanyl as needed pain control -Consulted [facility] to evaluate, plan for ERCP in a.m  Addendum: Diarrhea: Acute.  Patient noted have explosive diarrhea by nursing staff. -Continue metronidazole day 2  Acute kidney injury on chronic kidney disease:Baseline creatinine previously noted to be around 1.6, but creatinine elevated up to 1.93 today with BUN 29. -Strict I&Os -Continue IV fluids as seen above  Hyperkalemia: Mild elevation in potassium up to 5.4. -Continue IV fluids -Recheck potassium in a.m.  Diabetes mellitus type 2 with hypoglycemia: Patient initially noted to be hyperglycemic with blood glucose into the 60s.  He had initially been placed on a sliding scale of insulin but was given juice with improvement of blood sugars. Hemoglobin A1c 7. -Hypoglycemic protocols initiated -Discontinue sliding scale insulin for now due to hypoglycemia -Consider restarting sliding scale regimen after procedure once patient tolerating p.o.  Essential hypertension: Blood pressures currently uncontrolled 284/13. -Resume Bystolic -Hold furosemide  History of coronary artery disease -Continue isosorbide mononitrate -Consider restarting aspirin and statin when medically  GERD with history of esophagitis, hiatal hernia: Patient with recent EGD performed on 12/2017 for which patient underwent esophageal dilation after found to have esophageal stricture and Barrett's esophagus by Dr. Havery Hughes.  CT scan performed 3/11 noted signs of gastritis or peptic ulcer disease in the stomach as well. -Continue pharmacy substitution of Protonix twice daily for omeprazole  Dyslipidemia -Hold simvastatin and Zetia  Dyspnea: Patient reports that this chronic.  Recommend continue outpatient follow-up with  pulmonology  Body mass index is 27.47 kg/m.   Family Communication/Anticipated D/C date and plan/Code Status   DVT prophylaxis: Lovenox ordered. Code Status: Full Code.  Family Communication: No family present at bedside Disposition Plan: Possibly discharge home in 2 to 3 days depending on pending procedure and resolution of pancreatitis symptoms   Medical Consultants:    [facility]   Anti-Infectives:    Cefepime changed to Rocephin effective day 2  Metronidazole day 2  Subjective:   Patient reports having epigastric pain that occurs shortly after every time that he eats.  He also reports shortness of breath that he notes is chronic for last 4 to 5 years followed by [name].  Objective:    Vitals:   03/31/18 0030 03/31/18 0103 03/31/18 0519 03/31/18 1155  BP: 121/61 128/70 (!) 143/71 (!) 161/56  Pulse: 79 83 70 85  Resp:  18 18 18   Temp:  98.3 F (36.8 C) 98.9 F (37.2 C) 97.9 F (36.6 C)  TempSrc:  Oral Oral Oral  SpO2: 94% 95% 97% 96%  Weight:      Height:        Intake/Output Summary (Last 24 hours) at 03/31/2018 1249 Last data filed at 03/31/2018 0602 Gross per 24 hour  Intake 685.29 ml  Output 325 ml  Net 360.29 ml   Filed Weights   03/31/18 0000  Weight: 84.4 kg    Exam: Constitutional: Elderly male who appears to be in no acute discomfort, calm, comfortable Eyes: PERRL, lids and conjunctivae normal ENMT: Mucous membranes are dry. Posterior pharynx clear of any exudate or lesions.   Neck: normal, supple, no masses, no thyromegaly Respiratory: clear to auscultation bilaterally, no wheezing, no crackles. Normal respiratory effort. No accessory muscle use.  Cardiovascular: Regular rate and rhythm, no murmurs / rubs / gallops. No extremity edema. 2+ pedal pulses. No carotid bruits.  Abdomen: Tenderness to palpation epigastrically, no masses palpated. No hepatosplenomegaly. Bowel sounds positive.  Musculoskeletal: no clubbing /  cyanosis. No joint deformity upper and lower extremities. Good ROM, no contractures. Normal muscle tone.  Skin: no rashes, lesions, ulcers. No induration Neurologic: CN 2-12 grossly intact. Sensation intact, DTR normal. Strength 5/5 in all 4.  Psychiatric: Normal judgment and insight. Alert and oriented x 3. Normal mood.    Data Reviewed:   I have personally reviewed following labs and imaging studies:  Labs: Labs show the following:   Basic Metabolic Panel: Recent Labs  Lab 03/30/18 1616 03/31/18 0508  NA 139 144  K 4.2 5.4*  CL 106 110  CO2 22 26  GLUCOSE 182* 124*  BUN 20 29*  CREATININE 1.59* 1.93*  CALCIUM 8.9 8.2*   GFR Estimated Creatinine Clearance: 33.1 mL/min (A) (by C-G formula based on SCr of 1.93 mg/dL (H)). Liver Function Tests: Recent Labs  Lab 03/30/18 1616 03/31/18 0508  AST 70* 43*  ALT 32 26  ALKPHOS 90 65  BILITOT 1.5* 2.1*  PROT 7.6 6.1*  ALBUMIN 3.8 2.9*   Recent Labs  Lab 03/30/18 1616 03/31/18 0508  LIPASE 390* 1,793*   No results for input(s): AMMONIA in the last 168 hours. Coagulation profile No results for input(s): INR, PROTIME in the last 168 hours.  CBC: Recent Labs  Lab 03/30/18 1616 03/31/18 0508  WBC 21.4* 12.6*  NEUTROABS  --  11.3*  HGB 13.2 11.7*  HCT 41.5 36.0*  MCV 87.7 87.4  PLT 186 PLATELET CLUMPS NOTED ON SMEAR, UNABLE TO ESTIMATE   Cardiac Enzymes: No results for  input(s): CKTOTAL, CKMB, CKMBINDEX, TROPONINI in the last 168 hours. BNP (last 3 results) No results for input(s): PROBNP in the last 8760 hours. CBG: Recent Labs  Lab 03/31/18 0124 03/31/18 0525 03/31/18 0859 03/31/18 1105 03/31/18 1208  GLUCAP 119* 91 98 60* 89   D-Dimer: No results for input(s): DDIMER in the last 72 hours. Hgb A1c: Recent Labs    03/31/18 0109  HGBA1C 7.0*   Lipid Profile: No results for input(s): CHOL, HDL, LDLCALC, TRIG, CHOLHDL, LDLDIRECT in the last 72 hours. Thyroid function studies: No results for  input(s): TSH, T4TOTAL, T3FREE, THYROIDAB in the last 72 hours.  Invalid input(s): FREET3 Anemia work up: No results for input(s): VITAMINB12, FOLATE, FERRITIN, TIBC, IRON, RETICCTPCT in the last 72 hours. Sepsis Labs: Recent Labs  Lab 03/30/18 1616 03/30/18 2212 03/31/18 0109 03/31/18 0508  WBC 21.4*  --   --  12.6*  LATICACIDVEN  --  2.4* 1.7 2.2*    Microbiology Recent Results (from the past 240 hour(s))  Culture, blood (routine x 2)     Status: None (Preliminary result)   Collection Time: 03/30/18 10:12 PM  Result Value Ref Range Status   Specimen Description BLOOD RIGHT FOREARM  Final   Special Requests   Final    BOTTLES DRAWN AEROBIC AND ANAEROBIC Blood Culture adequate volume   Culture  Setup Time   Final    Organism ID to follow GRAM NEGATIVE RODS CRITICAL RESULT CALLED TO, READ BACK BY AND VERIFIED WITH: PHRMD M TURNER @1311  03/31/2018 BY S GEZAHEGN Performed at [facility], [address] [place], [place] [address]    Culture GRAM NEGATIVE RODS  Final   Report Status PENDING  Incomplete  Culture, blood (routine x 2)     Status: None (Preliminary result)   Collection Time: 03/30/18 10:12 PM  Result Value Ref Range Status   Specimen Description BLOOD RIGHT HAND  Final   Special Requests   Final    BOTTLES DRAWN AEROBIC AND ANAEROBIC Blood Culture adequate volume   Culture  Setup Time   Final    GRAM NEGATIVE RODS AEROBIC BOTTLE ONLY CRITICAL VALUE NOTED.  VALUE IS CONSISTENT WITH PREVIOUSLY REPORTED AND CALLED VALUE. Performed at [facility], [place] [address] [place], [place] [address]    Culture GRAM NEGATIVE RODS  Final   Report Status PENDING  Incomplete    Procedures and diagnostic studies:  Ct Abdomen Pelvis W Contrast  Result Date: 03/30/2018 CLINICAL DATA:  78 y/o  M; generalized abdominal pain and tremors. EXAM: CT ABDOMEN AND PELVIS WITH CONTRAST TECHNIQUE: Multidetector CT imaging of the abdomen and pelvis was performed using the  standard protocol following bolus administration of intravenous contrast. CONTRAST:  64mL OMNIPAQUE IOHEXOL 300 MG/ML  SOLN COMPARISON:  01/05/2018 CT abdomen and pelvis FINDINGS: Lower chest: No acute abnormality. Hepatobiliary: No focal liver abnormality. No gallbladder wall thickening or radiopaque gallstone identified within the gallbladder. Increased dilatation of the common bile duct measuring up to 15 mm at the liver hilum. The common bile duct near the ampulla is opacified (series 3, image 33 and series 6, image 66). Pancreas: Edema surrounding the pancreas compatible with acute pancreatitis. No findings of necrosis or acute peripancreatic collection at this time. No main duct dilatation. Spleen: Normal in size without focal abnormality. Adrenals/Urinary Tract: Adrenal glands are unremarkable. Kidneys are normal, without renal calculi, focal lesion, or hydronephrosis. Bladder is unremarkable. Stomach/Bowel: Stomach is within normal limits. Appendix not identified, no pericecal inflammation.  No evidence of bowel wall thickening, distention, or inflammatory changes. Vascular/Lymphatic: Aortic atherosclerosis. No enlarged abdominal or pelvic lymph nodes. Reproductive: Prostate is unremarkable. Other: No abdominal wall hernia or abnormality. No abdominopelvic ascites. Musculoskeletal: No fracture is seen. IMPRESSION: 1. Findings compatible with acute pancreatitis. No necrosis or acute peripancreatic collection identified at this time. 2. Increased dilatation of the common bile duct measuring up to 15 mm at the liver hilum and opacified common bile duct near the ampulla. No main duct dilatation of the pancreas. Findings may represent stricture, obstructing mass, or choledocholithiasis. This can be further evaluated with MRI/MRCP without and with contrast. Electronically Signed   By: Kristine Garbe M.D.   On: 03/30/2018 23:08   Mr 3d Recon At Scanner  Result Date: 03/31/2018 CLINICAL DATA:  Acute  pancreatitis. EXAM: MRI ABDOMEN WITHOUT AND WITH CONTRAST (INCLUDING MRCP) TECHNIQUE: Multiplanar multisequence MR imaging of the abdomen was performed both before and after the administration of intravenous contrast. Heavily T2-weighted images of the biliary and pancreatic ducts were obtained, and three-dimensional MRCP images were rendered by post processing. CONTRAST:  8.4 cc Gadavist COMPARISON:  CT scan 03/30/2018 FINDINGS: Lower chest: The lung bases are clear of an acute process. The heart is normal in size. No pericardial effusion. Hepatobiliary: No focal hepatic lesions. Mild stable intrahepatic and extrahepatic biliary dilatation when compared to CT scan from 2019. The gallbladder is distended. No significant gallbladder wall thickening but there is mild pericholecystic fluid. Small gallstones suspected in the gallbladder neck. Intra and extrahepatic biliary dilatation. No common bile duct stones are identified. No ampullary mass or pancreatic head mass. There could be a distal stricture. This appears to be chronic. Pancreas: Mild peripancreatic inflammatory changes with a small amount of fluid in the anterior pararenal spaces. Normal pancreatic enhancement and normal caliber and course of the main pancreatic duct. Findings consistent with mild uncomplicated pancreatitis as seen on the CT scan. Spleen:  Normal size.  No focal lesions. Adrenals/Urinary Tract: The adrenal glands and kidneys are unremarkable. Stomach/Bowel: The gastric wall in the body and antral regions appears somewhat thickened and edematous. There is also moderate enhancement. Could not exclude peptic ulcer disease/gastritis. The visualized small bowel and colon are unremarkable. Vascular/Lymphatic: No pathologically enlarged lymph nodes identified. No abdominal aortic aneurysm demonstrated. Small scattered nodes, likely inflammatory. Other:  Small amount of free fluid but no overt ascites. Musculoskeletal: No significant findings.  IMPRESSION: 1. MR findings consistent with mild acute uncomplicated pancreatitis. 2. Mild gallbladder distension and mild pericholecystic fluid but no gallbladder wall thickening or other definite findings for acute cholecystitis. 3. Stable appearing intra and extrahepatic biliary dilatation. Possible distal stricture. No common bile duct stones are identified. Normal main pancreatic duct. 4. Possible inflammatory changes involving the stomach which could suggest peptic ulcer disease or gastritis. Electronically Signed   By: Marijo Sanes M.D.   On: 03/31/2018 08:43   Mr Abdomen Mrcp Moise Boring Contast  Result Date: 03/31/2018 CLINICAL DATA:  Acute pancreatitis. EXAM: MRI ABDOMEN WITHOUT AND WITH CONTRAST (INCLUDING MRCP) TECHNIQUE: Multiplanar multisequence MR imaging of the abdomen was performed both before and after the administration of intravenous contrast. Heavily T2-weighted images of the biliary and pancreatic ducts were obtained, and three-dimensional MRCP images were rendered by post processing. CONTRAST:  8.4 cc Gadavist COMPARISON:  CT scan 03/30/2018 FINDINGS: Lower chest: The lung bases are clear of an acute process. The heart is normal in size. No pericardial effusion. Hepatobiliary: No focal hepatic lesions. Mild stable intrahepatic and extrahepatic  biliary dilatation when compared to CT scan from 2019. The gallbladder is distended. No significant gallbladder wall thickening but there is mild pericholecystic fluid. Small gallstones suspected in the gallbladder neck. Intra and extrahepatic biliary dilatation. No common bile duct stones are identified. No ampullary mass or pancreatic head mass. There could be a distal stricture. This appears to be chronic. Pancreas: Mild peripancreatic inflammatory changes with a small amount of fluid in the anterior pararenal spaces. Normal pancreatic enhancement and normal caliber and course of the main pancreatic duct. Findings consistent with mild uncomplicated  pancreatitis as seen on the CT scan. Spleen:  Normal size.  No focal lesions. Adrenals/Urinary Tract: The adrenal glands and kidneys are unremarkable. Stomach/Bowel: The gastric wall in the body and antral regions appears somewhat thickened and edematous. There is also moderate enhancement. Could not exclude peptic ulcer disease/gastritis. The visualized small bowel and colon are unremarkable. Vascular/Lymphatic: No pathologically enlarged lymph nodes identified. No abdominal aortic aneurysm demonstrated. Small scattered nodes, likely inflammatory. Other:  Small amount of free fluid but no overt ascites. Musculoskeletal: No significant findings. IMPRESSION: 1. MR findings consistent with mild acute uncomplicated pancreatitis. 2. Mild gallbladder distension and mild pericholecystic fluid but no gallbladder wall thickening or other definite findings for acute cholecystitis. 3. Stable appearing intra and extrahepatic biliary dilatation. Possible distal stricture. No common bile duct stones are identified. Normal main pancreatic duct. 4. Possible inflammatory changes involving the stomach which could suggest peptic ulcer disease or gastritis. Electronically Signed   By: Marijo Sanes M.D.   On: 03/31/2018 08:43    Medications:    insulin aspart  0-9 Units Subcutaneous Q4H   pantoprazole  40 mg Oral Q0600   Continuous Infusions:  sodium chloride 1,000 mL (03/30/18 2332)   ceFEPime (MAXIPIME) IV     lactated ringers     metronidazole 500 mg (03/31/18 0825)     LOS: 0 days   Jaydah Stahle A Kaitlynne Wenz  [facility]   *Please refer to [email or web address], password [email or web address] to get updated schedule on who will round on this patient, as hospitalists switch teams weekly. If 7PM-7AM, please contact night-coverage at [email or web address], password [email or web address] for any overnight needs.

## 2018-04-01 ENCOUNTER — Encounter (HOSPITAL_COMMUNITY)
Admission: EM | Disposition: A | Discharge status: Home Health | Payer: Self-pay | Source: Home / Self Care | Attending: Internal Medicine

## 2018-04-01 ENCOUNTER — Inpatient Hospital Stay (HOSPITAL_COMMUNITY): Payer: Medicare Other | Admitting: Anesthesiology

## 2018-04-01 ENCOUNTER — Inpatient Hospital Stay (HOSPITAL_COMMUNITY): Payer: Medicare Other

## 2018-04-01 ENCOUNTER — Encounter (HOSPITAL_COMMUNITY): Payer: Self-pay | Admitting: Anesthesiology

## 2018-04-01 DIAGNOSIS — K8309 Other cholangitis: Secondary | ICD-10-CM

## 2018-04-01 HISTORY — PX: ESOPHAGOGASTRODUODENOSCOPY (EGD) WITH PROPOFOL: SHX5813

## 2018-04-01 HISTORY — PX: BILIARY STENT PLACEMENT: SHX5538

## 2018-04-01 HISTORY — PX: SPHINCTEROTOMY: SHX5544

## 2018-04-01 HISTORY — PX: EUS: SHX5427

## 2018-04-01 HISTORY — PX: BIOPSY: SHX5522

## 2018-04-01 HISTORY — PX: REMOVAL OF STONES: SHX5545

## 2018-04-01 HISTORY — PX: ENDOSCOPIC RETROGRADE CHOLANGIOPANCREATOGRAPHY (ERCP) WITH PROPOFOL: SHX5810

## 2018-04-01 HISTORY — PX: BILIARY BRUSHING: SHX6843

## 2018-04-01 LAB — GLUCOSE, CAPILLARY
Glucose-Capillary: 112 mg/dL — ABNORMAL HIGH (ref 70–99)
Glucose-Capillary: 113 mg/dL — ABNORMAL HIGH (ref 70–99)
Glucose-Capillary: 123 mg/dL — ABNORMAL HIGH (ref 70–99)
Glucose-Capillary: 135 mg/dL — ABNORMAL HIGH (ref 70–99)
Glucose-Capillary: 145 mg/dL — ABNORMAL HIGH (ref 70–99)
Glucose-Capillary: 162 mg/dL — ABNORMAL HIGH (ref 70–99)
Glucose-Capillary: 96 mg/dL (ref 70–99)

## 2018-04-01 LAB — CBC WITH DIFFERENTIAL/PLATELET
ABS IMMATURE GRANULOCYTES: 0.16 10*3/uL — AB (ref 0.00–0.07)
Basophils Absolute: 0 10*3/uL (ref 0.0–0.1)
Basophils Relative: 0 %
Eosinophils Absolute: 0.1 10*3/uL (ref 0.0–0.5)
Eosinophils Relative: 1 %
HCT: 31.1 % — ABNORMAL LOW (ref 39.0–52.0)
Hemoglobin: 9.6 g/dL — ABNORMAL LOW (ref 13.0–17.0)
Immature Granulocytes: 1 %
Lymphocytes Relative: 6 %
Lymphs Abs: 0.6 10*3/uL — ABNORMAL LOW (ref 0.7–4.0)
MCH: 27.5 pg (ref 26.0–34.0)
MCHC: 30.9 g/dL (ref 30.0–36.0)
MCV: 89.1 fL (ref 80.0–100.0)
MONO ABS: 0.4 10*3/uL (ref 0.1–1.0)
MONOS PCT: 3 %
Neutro Abs: 10.1 10*3/uL — ABNORMAL HIGH (ref 1.7–7.7)
Neutrophils Relative %: 89 %
Platelets: UNDETERMINED 10*3/uL (ref 150–400)
RBC: 3.49 MIL/uL — ABNORMAL LOW (ref 4.22–5.81)
RDW: 15.1 % (ref 11.5–15.5)
WBC: 11.4 10*3/uL — ABNORMAL HIGH (ref 4.0–10.5)
nRBC: 0 % (ref 0.0–0.2)

## 2018-04-01 LAB — COMPREHENSIVE METABOLIC PANEL
ALT: 24 U/L (ref 0–44)
ANION GAP: 10 (ref 5–15)
AST: 58 U/L — ABNORMAL HIGH (ref 15–41)
Albumin: 2.7 g/dL — ABNORMAL LOW (ref 3.5–5.0)
Alkaline Phosphatase: 61 U/L (ref 38–126)
BUN: 37 mg/dL — ABNORMAL HIGH (ref 8–23)
CO2: 23 mmol/L (ref 22–32)
Calcium: 8 mg/dL — ABNORMAL LOW (ref 8.9–10.3)
Chloride: 107 mmol/L (ref 98–111)
Creatinine, Ser: 2.15 mg/dL — ABNORMAL HIGH (ref 0.61–1.24)
GFR calc Af Amer: 34 mL/min — ABNORMAL LOW (ref >60)
GFR calc non Af Amer: 29 mL/min — ABNORMAL LOW (ref >60)
Glucose, Bld: 131 mg/dL — ABNORMAL HIGH (ref 70–99)
Potassium: 3.8 mmol/L (ref 3.5–5.1)
SODIUM: 140 mmol/L (ref 135–145)
Total Bilirubin: 1.1 mg/dL (ref 0.3–1.2)
Total Protein: 6.1 g/dL — ABNORMAL LOW (ref 6.5–8.1)

## 2018-04-01 SURGERY — ENDOSCOPIC RETROGRADE CHOLANGIOPANCREATOGRAPHY (ERCP) WITH PROPOFOL
Anesthesia: General

## 2018-04-01 SURGERY — UPPER ESOPHAGEAL ENDOSCOPIC ULTRASOUND (EUS)
Anesthesia: Monitor Anesthesia Care

## 2018-04-01 MED ORDER — ENOXAPARIN SODIUM 40 MG/0.4ML ~~LOC~~ SOLN
40.0000 mg | SUBCUTANEOUS | Status: DC
  Administered 2018-04-02 – 2018-04-06 (×5): 40 mg via SUBCUTANEOUS
  Filled 2018-04-01 (×5): qty 0.4

## 2018-04-01 MED ORDER — PANTOPRAZOLE SODIUM 40 MG PO TBEC
40.0000 mg | DELAYED_RELEASE_TABLET | Freq: Every day | ORAL | Status: DC
  Administered 2018-04-02 – 2018-04-06 (×5): 40 mg via ORAL
  Filled 2018-04-01 (×5): qty 1

## 2018-04-01 MED ORDER — FLUCONAZOLE 100MG IVPB
100.0000 mg | INTRAVENOUS | Status: DC
  Administered 2018-04-01 – 2018-04-04 (×4): 100 mg via INTRAVENOUS
  Filled 2018-04-01 (×4): qty 50

## 2018-04-01 MED ORDER — GLUCAGON HCL RDNA (DIAGNOSTIC) 1 MG IJ SOLR
INTRAMUSCULAR | Status: AC
  Filled 2018-04-01: qty 1

## 2018-04-01 MED ORDER — SUGAMMADEX SODIUM 200 MG/2ML IV SOLN
INTRAVENOUS | Status: DC | PRN
  Administered 2018-04-01: 200 mg via INTRAVENOUS

## 2018-04-01 MED ORDER — INDOMETHACIN 50 MG RE SUPP
RECTAL | Status: AC
  Filled 2018-04-01: qty 2

## 2018-04-01 MED ORDER — LIDOCAINE 2% (20 MG/ML) 5 ML SYRINGE
INTRAMUSCULAR | Status: DC | PRN
  Administered 2018-04-01: 100 mg via INTRAVENOUS

## 2018-04-01 MED ORDER — GLUCAGON HCL RDNA (DIAGNOSTIC) 1 MG IJ SOLR
INTRAMUSCULAR | Status: DC | PRN
  Administered 2018-04-01: .25 mg via INTRAVENOUS

## 2018-04-01 MED ORDER — SODIUM CHLORIDE 0.9 % IV BOLUS
1000.0000 mL | Freq: Once | INTRAVENOUS | Status: AC
  Administered 2018-04-01: 1000 mL via INTRAVENOUS

## 2018-04-01 MED ORDER — SODIUM CHLORIDE 0.9 % IV SOLN
INTRAVENOUS | Status: AC
  Administered 2018-04-01: 13:00:00 via INTRAVENOUS

## 2018-04-01 MED ORDER — PHENYLEPHRINE 40 MCG/ML (10ML) SYRINGE FOR IV PUSH (FOR BLOOD PRESSURE SUPPORT)
PREFILLED_SYRINGE | INTRAVENOUS | Status: DC | PRN
  Administered 2018-04-01 (×2): 80 ug via INTRAVENOUS

## 2018-04-01 MED ORDER — ONDANSETRON HCL 4 MG/2ML IJ SOLN
INTRAMUSCULAR | Status: DC | PRN
  Administered 2018-04-01: 4 mg via INTRAVENOUS

## 2018-04-01 MED ORDER — PROPOFOL 10 MG/ML IV BOLUS
INTRAVENOUS | Status: DC | PRN
  Administered 2018-04-01: 150 mg via INTRAVENOUS
  Administered 2018-04-01: 50 mg via INTRAVENOUS

## 2018-04-01 MED ORDER — SODIUM CHLORIDE 0.9 % IV SOLN
INTRAVENOUS | Status: DC | PRN
  Administered 2018-04-01: 30 mL

## 2018-04-01 MED ORDER — ROCURONIUM BROMIDE 10 MG/ML (PF) SYRINGE
PREFILLED_SYRINGE | INTRAVENOUS | Status: DC | PRN
  Administered 2018-04-01: 50 mg via INTRAVENOUS

## 2018-04-01 MED ORDER — CIPROFLOXACIN IN D5W 400 MG/200ML IV SOLN
INTRAVENOUS | Status: AC
  Filled 2018-04-01: qty 200

## 2018-04-01 MED ORDER — SODIUM CHLORIDE 0.9 % IV SOLN
INTRAVENOUS | Status: DC | PRN
  Administered 2018-04-01: 50 ug/min via INTRAVENOUS

## 2018-04-01 MED ORDER — LACTATED RINGERS IV SOLN
INTRAVENOUS | Status: DC | PRN
  Administered 2018-04-01: 10:00:00 via INTRAVENOUS

## 2018-04-01 MED ORDER — INDOMETHACIN 50 MG RE SUPP
RECTAL | Status: DC | PRN
  Administered 2018-04-01: 100 mg via RECTAL

## 2018-04-01 NOTE — Op Note (Addendum)
[facility] Patient Name: [name] Procedure Date : 04/01/2018 MRN: [id number] Attending MD: Justice Britain , MD Date of Birth: [birth year] CSN: [id number] Age: 78 Admit Type: Inpatient Procedure:                ERCP Indications:              Biliary dilation on Computed Tomogram Scan, Biliary                            stricture on magnetic resonance imaging, Evaluation                            and possible treatment of bile duct stone(s),                            Abnormal liver function test, Acute pancreatitis,                            Acute pancreatitis suspected due to gallstone,                            Concern for Cholangitis in setting of GNR bacteremia Providers:                Justice Britain, MD, Cleda Daub, RN, Cletis Athens, Technician Referring MD:              Medicines:                General Anesthesia, Cipro 400 mg IV, Indomethacin                            811 mg PR Complications:            No immediate complications. Estimated Blood Loss:     Estimated blood loss was minimal. Procedure:                Pre-Anesthesia Assessment:                           - Prior to the procedure, a History and Physical                            was performed, and patient medications and                            allergies were reviewed. The patient's tolerance of                            previous anesthesia was also reviewed. The risks                            and benefits of the procedure and the sedation  options and risks were discussed with the patient.                            All questions were answered, and informed consent                            was obtained. Prior Anticoagulants: The patient has                            taken Lovenox (enoxaparin), last dose was 1 day                            prior to procedure. ASA Grade Assessment: II - A                             patient with mild systemic disease. After reviewing                            the risks and benefits, the patient was deemed in                            satisfactory condition to undergo the procedure.                           After obtaining informed consent, the scope was                            passed under direct vision. Throughout the                            procedure, the patient's blood pressure, pulse, and                            oxygen saturations were monitored continuously. The                            TJF-Q180V ([id number] Olympus duodenoscope was                            introduced through the mouth, and used to inject                            contrast into and used to inject contrast into the                            bile duct and ventral pancreatic duct. The ERCP was                            accomplished without difficulty. The patient                            tolerated the procedure. Scope In: Scope Out: Findings:      The scout film was normal.  The esophagus was successfully intubated under direct vision without       detailed examination of the pharynx, larynx, and associated structures,       and upper GI tract. The major papilla was flat.      A short 0.035 inch Soft Jagwire was passed into the biliary tree. The       Autotome sphincterotome was passed over the guidewire and the bile duct       was then deeply cannulated. Contrast was injected. I personally       interpreted the bile duct and pancreatic duct images. Ductal flow of       contrast was adequate. Image quality was adequate. Contrast extended to       the hepatic ducts. Opacification of the entire biliary tree except for       the cystic duct and gallbladder was successful. Low injection at the       orifice of the biliary tree led to a small injection of contrast       inadvertantly into the pancreatic duct. The pancreatic duct was normal       appearing. The lower third of the  main bile duct to the ampulla       contained a single moderate stenosis 9 mm in length. The lower third of       the main duct contained filling defect(s) thought to be sludge. A 5 mm       biliary sphincterotomy was made with a monofilament Autotome       sphincterotome using ERBE electrocautery due to the flat nature of the       ampulla. There was self limited oozing from the sphincterotomy which did       not require treatment. To discover objects, the biliary tree was swept       with a retrieval balloon starting at the bifurcation. Thick biliary       sludge was swept from the duct. Debris was swept from the duct. An       occlusion cholangiogram was performed that showed no further significant       biliary pathology. Cells for cytology were obtained by brushing in the       biliary pancreatic junction and distal third of the bile duct. I watched       for flow for the next 5 minutes but drainage was inadequate. I decided       to place a stent. One 10 Fr by 5 cm plastic biliary stent with a single       external flap and a single internal flap was placed into the common bile       duct. Bile flowed through the stent. The stent was in good position.      The duodenoscope was withdrawn from the patient. Impression:               - The major papilla appeared to be flat.                           - The fluoroscopic examination was suspicious for                            sludge.                           - A single  moderate biliary stricture was found in                            the lower third of the main bile duct. The                            stricture was indeterminate. This was brushed for                            cytology.                           - A biliary sphincterotomy was performed.                           - The biliary tree was swept and sludge and debris                            were found.                           - One plastic biliary stent was placed into  the                            common bile duct and allowed good drainage at the                            end of the procedure of the biliary tree suggestive                            that stenosis was significant. Recommendation:           - The patient will be observed post-procedure,                            until all discharge criteria are met.                           - Return patient to hospital ward for ongoing care.                           - Check liver enzymes (AST, ALT, alkaline                            phosphatase, bilirubin) in the morning.                           - Continue aggressive Pancreatitis supportive care.                            If he does not have improvement in his nutrition                            within the next 24-36 hours he will need Cortrak  feeding supplementation.                           - Further recommendations as per the Inpatient GI                            service.                           - Continue antibiotics as per medical service and                            bactermia.                           - Surgery should be consulted during this admission                            to discuss timing of likely cholecystectomy in                            setting of presumed biliary pancreatitis. Also the                            gallbladder did not fill during this ERCP so                            concern (although EUS not too revealing of                            significant gallbladder wall thickening) as to                            whether a component of cholecystitis is occuring as                            well.                           - Follow up cytology.                           - Plan for repeat ERCP and stent exchange in                            37-month or sooner but will need repeat                            cross-sectional imaging as well which can be                             determined in the near future based on his clinical                            trajectory.                           -  Watch for pancreatitis, bleeding, perforation,                            and cholangitis.                           - The findings and recommendations were discussed                            with the patient.                           - The findings and recommendations were discussed                            with the patient's family. Procedure Code(s):        --- Professional ---                           [phone], Endoscopic retrograde                            cholangiopancreatography (ERCP); with placement of                            endoscopic stent into biliary or pancreatic duct,                            including pre- and post-dilation and guide wire                            passage, when performed, including sphincterotomy,                            when performed, each stent                           [id number], Endoscopic retrograde                            cholangiopancreatography (ERCP); with removal of                            calculi/debris from biliary/pancreatic duct(s) Diagnosis Code(s):        --- Professional ---                           Y69.4, Other specified diseases of biliary tract                           K80.51, Calculus of bile duct without cholangitis                            or cholecystitis with obstruction                           R94.5, Abnormal results of liver function studies  K85.90, Acute pancreatitis without necrosis or                            infection, unspecified CPT copyright 2018 [employer]. All rights reserved. The codes documented in this report are preliminary and upon coder review may  be revised to meet current compliance requirements. Justice Britain, MD 04/01/2018 12:31:21 PM Number of Addenda: 0

## 2018-04-01 NOTE — Anesthesia Procedure Notes (Signed)
Procedure Name: Intubation Date/Time: 04/01/2018 10:06 AM Performed by: Eulas Post, Nijee Heatwole W, CRNA Pre-anesthesia Checklist: Patient identified, Emergency Drugs available, Suction available and Patient being monitored Patient Re-evaluated:Patient Re-evaluated prior to induction Oxygen Delivery Method: Circle system utilized Preoxygenation: Pre-oxygenation with 100% oxygen Induction Type: IV induction Ventilation: Mask ventilation without difficulty Laryngoscope Size: Miller and 2 Grade View: Grade I Tube type: Oral Tube size: 7.5 mm Number of attempts: 1 Airway Equipment and Method: Stylet Placement Confirmation: ETT inserted through vocal cords under direct vision,  positive ETCO2 and breath sounds checked- equal and bilateral Secured at: 23 cm Tube secured with: Tape Dental Injury: Teeth and Oropharynx as per pre-operative assessment

## 2018-04-01 NOTE — Progress Notes (Addendum)
Patient arrived to Endoscopy. Upon assessing patients IV site, redness, pain and erythema present. 9cm x 7cm redness and erythema proximal from IV site. Patient stated moderate pain level prior to arriving to Endoscopy. Flagyl running at present. Pharmacy notified, procedural MD and MDA notified. IV removed. Patient reports pain relief to IV removal.   Pharmacy informed of infiltration and treat with warm compresses.

## 2018-04-01 NOTE — Interval H&P Note (Signed)
History and Physical Interval Note:  04/01/2018 9:42 AM  [name]  has presented today for surgery, with the diagnosis of Pancreatitis.  Dilated CBD..  The various methods of treatment have been discussed with the patient and family. After consideration of risks, benefits and other options for treatment, the patient has consented to  Procedure(s): ENDOSCOPIC RETROGRADE CHOLANGIOPANCREATOGRAPHY (ERCP) WITH PROPOFOL (N/A) UPPER ENDOSCOPIC ULTRASOUND (EUS) LINEAR (N/A) as a surgical intervention.  The patient's history has been reviewed, patient examined, no change in status, stable for surgery.  I have reviewed the patient's chart and labs.  Questions were answered to the patient's satisfaction.   The risks of EUS including bleeding, infection, aspiration pneumonia and intestinal perforation were discussed as was the possibility it may not give a definitive diagnosis.  If a biopsy of the pancreas is done as part of the EUS, there is an additional risk of pancreatitis at the rate of about 1%.  It was explained that procedure related pancreatitis is typically mild, although can be severe and even life threatening, which is why we do not perform random pancreatic biopsies and only biopsy a lesion we feel is concerning enough to warrant the risk.  The risks of an ERCP were discussed at length, including but not limited to the risk of perforation, bleeding, abdominal pain, post-ERCP pancreatitis (while usually mild can be severe and even life threatening).  This patient's case was reviewed with Dr. Bryan Lemma and with the patient and his daughter.  Liver tests have improved from yesterday into today.  Unfortunately, he continues to have fevers as high as 103.  His MRI was reviewed by me personally and has previously been reviewed by Dr. Bryan Lemma in radiology and there has been some increased ductal dilation from the CT scans in December to CT scan on admission and MRI CP yesterday.  He has bacteremia  as well.  I am partially perplexed by the liver tests being much better today with a normal bilirubin normal alkaline phosphatase and near normal AST.  He could have had transient bacteremia and cholangitis from pancreatitis and he could still have a small stone less than 5 mm since the sensitivity of MRI/MRCP is less than 90% for those small stones.  I will proceed with an EUS and if I can clearly delineate the entire biliary duct without any evidence of sludge or stone I will stop there.  If not or if there is stones/debris within the biliary tree we will proceed with an ERCP attempt.  [employer]

## 2018-04-01 NOTE — Anesthesia Postprocedure Evaluation (Signed)
Anesthesia Post Note  Patient: DREVIN ORTNER  Procedure(s) Performed: ENDOSCOPIC RETROGRADE CHOLANGIOPANCREATOGRAPHY (ERCP) WITH PROPOFOL (N/A ) UPPER ENDOSCOPIC ULTRASOUND (EUS) LINEAR (N/A ) BIOPSY SPHINCTEROTOMY BILIARY BRUSHING BILIARY STENT PLACEMENT     Patient location during evaluation: Endoscopy Anesthesia Type: General Level of consciousness: awake and sedated Pain management: pain level controlled Vital Signs Assessment: post-procedure vital signs reviewed and stable Respiratory status: spontaneous breathing Cardiovascular status: stable Postop Assessment: no apparent nausea or vomiting Anesthetic complications: no    Last Vitals:  Vitals:   04/01/18 1220 04/01/18 1230  BP: (!) 160/60 (!) 152/61  Pulse: 83 78  Resp: 17 (!) 21  Temp:    SpO2: 96% 94%    Last Pain:  Vitals:   04/01/18 1202  TempSrc: Axillary  PainSc: 2    Pain Goal: Patients Stated Pain Goal: 2 (04/01/18 0728)                 Huston Foley

## 2018-04-01 NOTE — Transfer of Care (Signed)
Immediate Anesthesia Transfer of Care Note  Patient: [name]  Procedure(s) Performed: ENDOSCOPIC RETROGRADE CHOLANGIOPANCREATOGRAPHY (ERCP) WITH PROPOFOL (N/A ) UPPER ENDOSCOPIC ULTRASOUND (EUS) LINEAR (N/A ) BIOPSY SPHINCTEROTOMY BILIARY BRUSHING BILIARY STENT PLACEMENT  Patient Location: PACU  Anesthesia Type:General  Level of Consciousness: patient cooperative and responds to stimulation  Airway & Oxygen Therapy: Patient Spontanous Breathing and Patient connected to face mask oxygen  Post-op Assessment: Report given to RN and Post -op Vital signs reviewed and stable  Post vital signs: Reviewed and stable  Last Vitals:  Vitals Value Taken Time  BP 138/84 04/01/2018 12:02 PM  Temp    Pulse 81 04/01/2018 12:04 PM  Resp 22 04/01/2018 12:04 PM  SpO2 100 % 04/01/2018 12:04 PM  Vitals shown include unvalidated device data.  Last Pain:  Vitals:   04/01/18 1202  TempSrc: Oral  PainSc:       Patients Stated Pain Goal: 2 (34/91/79 1505)  Complications: No apparent anesthesia complications

## 2018-04-01 NOTE — Anesthesia Preprocedure Evaluation (Signed)
Anesthesia Evaluation  Patient identified by MRN, date of birth, ID band Patient awake    Reviewed: Allergy & Precautions, NPO status , Patient's Chart, lab work & pertinent test results  Airway Mallampati: I  TM Distance: >3 FB Neck ROM: Full    Dental  (+) Dental Advisory Given   Pulmonary COPD, former smoker,    Pulmonary exam normal breath sounds clear to auscultation       Cardiovascular hypertension, Pt. on home beta blockers and Pt. on medications + CAD and + CABG  Normal cardiovascular exam Rhythm:Regular Rate:Normal     Neuro/Psych Depression    GI/Hepatic GERD  Medicated,  Endo/Other    Renal/GU Renal InsufficiencyRenal disease     Musculoskeletal   Abdominal Normal abdominal exam  (+)   Peds  Hematology  (+) Blood dyscrasia, anemia ,   Anesthesia Other Findings   Reproductive/Obstetrics                             Anesthesia Physical  Anesthesia Plan  ASA: III  Anesthesia Plan: General   Post-op Pain Management:    Induction: Intravenous  PONV Risk Score and Plan: 2 and Dexamethasone, Ondansetron and Treatment may vary due to age or medical condition  Airway Management Planned: Oral ETT  Additional Equipment:   Intra-op Plan:   Post-operative Plan: Extubation in OR  Informed Consent: I have reviewed the patients History and Physical, chart, labs and discussed the procedure including the risks, benefits and alternatives for the proposed anesthesia with the patient or authorized representative who has indicated his/her understanding and acceptance.       Plan Discussed with: CRNA  Anesthesia Plan Comments:         Anesthesia Quick Evaluation

## 2018-04-01 NOTE — Op Note (Signed)
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 Estimated Blood Loss:     Estimated blood loss was minimal. Estimated blood                            loss was minimal. Procedure:                Pre-Anesthesia Assessment:                           - Prior to the procedure, a History and Physical                            was performed, and patient medications and                            allergies were reviewed. The patient's tolerance of                            previous anesthesia was also reviewed. The risks                            and benefits of the procedure and the sedation                            options and risks were discussed with the patient.                            All questions were answered, and informed consent  was obtained. Prior Anticoagulants: The patient has                            taken Lovenox (enoxaparin), last dose was 1 day                            prior to procedure. ASA Grade Assessment: II - A                            patient with mild systemic disease. After reviewing                            the risks and benefits, the patient was deemed in   satisfactory condition to undergo the procedure.                           After obtaining informed consent, the endoscope was                            passed under direct vision. Throughout the                            procedure, the patient's blood pressure, pulse, and                            oxygen saturations were monitored continuously. The                            [id number]-H190 ([id number] Olympus gastroscope was                            introduced through the mouth, and advanced to the                            second part of duodenum. The GF-[id number]                            Olympus Linear EUS scope was introduced through the                            mouth, and advanced to the duodenum for ultrasound                            examination from the stomach and duodenum. The                            upper EUS was accomplished without difficulty. The                            patient tolerated the procedure. Scope In: Scope Out: Findings:      ENDOSCOPIC FINDING: :      White nummular lesions were noted on the proximal esophagus. Biopsies       were taken with a cold forceps for  histology to rule out Candida.      No other gross lesions were noted in the mid esophagus and in the distal       esophagus.      Localized atrophic mucosa was found in the gastric fundus. Biopsies were       taken with a cold forceps for histology.      Multiple dispersed, small non-bleeding erosions were found in the       gastric body, at the incisura and in the gastric antrum. There were no       stigmata of recent bleeding.      No other gross lesions were noted in the entire examined stomach.       Biopsies were taken with a cold forceps for histology and Helicobacter       pylori testing.      Localized severe mucosal changes characterized by congestion, erythema,       friability (with contact bleeding), granularity, inflammation and       altered texture were found at the  pylorus. Biopsies were taken with a       cold forceps for histology.      No gross lesions were noted in the duodenal bulb, in the first portion       of the duodenum and in the second portion of the duodenum.      The ampulla was flat but otherwise normal no bile expressed through       suction.      ENDOSONOGRAPHIC FINDING: :      Pancreatic parenchymal abnormalities were noted in the in the genu of       the pancreas, body of the pancreas and tail of the pancreas. These       consisted of diffuse echogenicity - this is most likely a result of the       acute pancreatitis. The head of the pancreas was not grossly abnormal       however the heterogeniety of the head of pancreas due to inflammation       can decrease our sensitivity to find subtle lesions.      The pancreatic duct had a normal endosonographic appearance in the genu       of the pancreas, body of the pancreas and tail of the pancreas.      There was dilation in the common bile duct and in the common hepatic       duct (11.4 mm).      There was a suggestion of a stricture in the very distal lower third of       the main bile duct when the CBD was lost and then found at the ampulla.      Moderate hyperechoic material consistent with sludge was visualized       endosonographically in the common bile duct just above the above noted       stensosi.      Moderate hyperechoic material consistent with sludge was visualized       endosonographically in the gallbladder.      Endosonographic imaging in the visualized portion of the liver showed no       lesion.      The celiac region was visualized. Impression:               EGD Impression:                           -  White nummular lesions on esophageal mucosa.                            Biopsied for Candida rule out.                           - Gastric mucosal atrophy at fundus. Biopsied.                           - Non-bleeding erosive gastropathy. Biopsied for HP.                            - Congested, erythematous, friable (with contact                            bleeding), granular, inflamed and texture changed                            mucosa in the pylorus. Biopsied to rule out adenoma                            or other processes..                           - No gross lesions in the duodenal bulb, in the                            first portion of the duodenum and in the second                            portion of the duodenum.                           - Ampulla was flat. No bile was able to be                            suctioned out.                           EUS Impression:                           - Pancreatic parenchymal abnormalities consisting                            of diffuse echogenicity were noted in the genu of                            the pancreas, pancreatic body and pancreatic tail.                            The head of the pancreas due to the heterogeneity                            was not grossly abnormal  however, the chance of                            missing subtle lesions with decreased sensitivity                            in setting of acute pancreatitis is possible.                           - The pancreatic duct had a normal endosonographic                            appearance in the genu of the pancreas, body of the                            pancreas and tail of the pancreas.                           - There was dilation in the common bile duct and in                            the common hepatic duct.                           - There was a suggestion of a stricture in the                            lower third of the main bile duct.                           - Hyperechoic material consistent with sludge was                            visualized endosonographically in the common bile                            duct above the likely stricture.                           - Hyperechoic material consistent with sludge  was                            visualized endosonographically in the gallbladder. Recommendation:           - Proceed to scheduled ERCP.                           - Await path results.                           - The findings and recommendations were discussed                            with the patient.                           -  The findings and recommendations were discussed                            with the patient's family. Procedure Code(s):        --- Professional ---                           [phone], Esophagogastroduodenoscopy, flexible,                            transoral; with endoscopic ultrasound examination                            limited to the esophagus, stomach or duodenum, and                            adjacent structures Diagnosis Code(s):        --- Professional ---                           K22.8, Other specified diseases of esophagus                           K31.89, Other diseases of stomach and duodenum                           K92.2, Gastrointestinal hemorrhage, unspecified                           K29.70, Gastritis, unspecified, without bleeding                           K83.8, Other specified diseases of biliary tract                           K83.1, Obstruction of bile duct                           R74.8, Abnormal levels of other serum enzymes                           K85.90, Acute pancreatitis without necrosis or                            infection, unspecified CPT copyright 2018 [employer]. All rights reserved. The codes documented in this report are preliminary and upon coder review may  be revised to meet current compliance requirements. Justice Britain, MD 04/01/2018 12:15:06 PM Number of Addenda: 0

## 2018-04-01 NOTE — Progress Notes (Addendum)
[facility] PROGRESS NOTE    Progress Note  [name]  ZWC:[id number] DOB: 1940-08-28 DOA: 03/30/2018 PCP: Isaac Bliss, Rayford Halsted, MD     Brief Narrative:   [name] is an 78 y.o. male past medical history of CAD, bladder cancer essential hypertension, esophageal stenosis status post dilation who presents with complaint of epigastric pain after eating was found to have biliary pancreatitis  Assessment/Plan:   Sepsis ([place]) to ascending cholangitis/gram-negative bacteremia: He was fluid resuscitated and started empirically on IV Rocephin and Flagyl. Blood cultures were positive for gram-negative rods speciation and sensitivities are pending. Lactic acid acidosis is improving, blood pressure has remained stable. He continues to spike fevers. MRCP done on 03/31/2018 showed acute pancreatitis, mild gallbladder distention with pericholecystic fluid and gallbladder wall thickening concerning for acute cholecystitis, he had intrahepatic and extrahepatic bile duct dilation with a possible stricture no common bile duct identified GI was consulted recommended an ERCP on 04/01/2018 show stricture and sludge no stones.  Biopsies were done and results are pending. BCDI show possible E. coli Discontinue vancomycin. GI recommended to order a CMP, lactic acid prealbumin and C-reactive protein.  Acute biliary pancreatitis: CT scan and MRCP showed acute pancreatitis with a 50 mm dilation of the common bile duct near the ampulla. Keep him on a clear liquid diet. IV narcotics for pain control. GI has been consulted, who recommended an ERCP see above for further details. We will consult surgery for possible cholecystectomy.  Esophageal candidiasis: Start IV Diflucan.  Acute kidney injury on   CKD (chronic kidney disease), stage III ([place]): With a baseline creatinine around 1.3-1.6.  This morning 2.1, likely prerenal azotemia in the setting of sepsis, NSAID use and IV contrast  (03/30/2018) and possibly IV vancomycin We will restart aggressive IV fluid hydration hold all nephrotoxic drugs, discontinue vancomycin Monitor strict I's and O's a basic metabolic panel daily. I have encouraged the patient to drink as much as possible.  HyPerkalemia: Improving with IV fluid hydration.  Essential hypertension Continue to hold Lasix. Continue Bystolic, blood pressure mildly elevated will continue to monitor.  History of esophagitis: Continue IV Protonix twice a day.  Dyslipidemia: Hold simvastatin and Zetia.   DVT prophylaxis: lovenxo Family Communication:daughter Disposition Plan/Barrier to D/C: unable to determine Code Status:     Code Status Orders  (From admission, onward)         Start     Ordered   03/30/18 2350  Full code  Continuous     03/30/18 2353        Code Status History    Date Active Date Inactive Code Status Order ID Comments User Context   11/24/2016 2337 11/25/2016 1700 Full Code [id number]  Avelina Laine, PA-C Inpatient        IV Access:    Peripheral IV   Procedures and diagnostic studies:   Ct Abdomen Pelvis W Contrast  Result Date: 03/30/2018 CLINICAL DATA:  78 y/o  M; generalized abdominal pain and tremors. EXAM: CT ABDOMEN AND PELVIS WITH CONTRAST TECHNIQUE: Multidetector CT imaging of the abdomen and pelvis was performed using the standard protocol following bolus administration of intravenous contrast. CONTRAST:  15mL OMNIPAQUE IOHEXOL 300 MG/ML  SOLN COMPARISON:  01/05/2018 CT abdomen and pelvis FINDINGS: Lower chest: No acute abnormality. Hepatobiliary: No focal liver abnormality. No gallbladder wall thickening or radiopaque gallstone identified within the gallbladder. Increased dilatation of the common bile duct measuring up to 15 mm at the liver hilum. The common bile duct  near the ampulla is opacified (series 3, image 33 and series 6, image 66). Pancreas: Edema surrounding the pancreas compatible with acute  pancreatitis. No findings of necrosis or acute peripancreatic collection at this time. No main duct dilatation. Spleen: Normal in size without focal abnormality. Adrenals/Urinary Tract: Adrenal glands are unremarkable. Kidneys are normal, without renal calculi, focal lesion, or hydronephrosis. Bladder is unremarkable. Stomach/Bowel: Stomach is within normal limits. Appendix not identified, no pericecal inflammation. No evidence of bowel wall thickening, distention, or inflammatory changes. Vascular/Lymphatic: Aortic atherosclerosis. No enlarged abdominal or pelvic lymph nodes. Reproductive: Prostate is unremarkable. Other: No abdominal wall hernia or abnormality. No abdominopelvic ascites. Musculoskeletal: No fracture is seen. IMPRESSION: 1. Findings compatible with acute pancreatitis. No necrosis or acute peripancreatic collection identified at this time. 2. Increased dilatation of the common bile duct measuring up to 15 mm at the liver hilum and opacified common bile duct near the ampulla. No main duct dilatation of the pancreas. Findings may represent stricture, obstructing mass, or choledocholithiasis. This can be further evaluated with MRI/MRCP without and with contrast. Electronically Signed   By: Kristine Garbe M.D.   On: 03/30/2018 23:08   Mr 3d Recon At Scanner  Result Date: 03/31/2018 CLINICAL DATA:  Acute pancreatitis. EXAM: MRI ABDOMEN WITHOUT AND WITH CONTRAST (INCLUDING MRCP) TECHNIQUE: Multiplanar multisequence MR imaging of the abdomen was performed both before and after the administration of intravenous contrast. Heavily T2-weighted images of the biliary and pancreatic ducts were obtained, and three-dimensional MRCP images were rendered by post processing. CONTRAST:  8.4 cc Gadavist COMPARISON:  CT scan 03/30/2018 FINDINGS: Lower chest: The lung bases are clear of an acute process. The heart is normal in size. No pericardial effusion. Hepatobiliary: No focal hepatic lesions. Mild  stable intrahepatic and extrahepatic biliary dilatation when compared to CT scan from 2019. The gallbladder is distended. No significant gallbladder wall thickening but there is mild pericholecystic fluid. Small gallstones suspected in the gallbladder neck. Intra and extrahepatic biliary dilatation. No common bile duct stones are identified. No ampullary mass or pancreatic head mass. There could be a distal stricture. This appears to be chronic. Pancreas: Mild peripancreatic inflammatory changes with a small amount of fluid in the anterior pararenal spaces. Normal pancreatic enhancement and normal caliber and course of the main pancreatic duct. Findings consistent with mild uncomplicated pancreatitis as seen on the CT scan. Spleen:  Normal size.  No focal lesions. Adrenals/Urinary Tract: The adrenal glands and kidneys are unremarkable. Stomach/Bowel: The gastric wall in the body and antral regions appears somewhat thickened and edematous. There is also moderate enhancement. Could not exclude peptic ulcer disease/gastritis. The visualized small bowel and colon are unremarkable. Vascular/Lymphatic: No pathologically enlarged lymph nodes identified. No abdominal aortic aneurysm demonstrated. Small scattered nodes, likely inflammatory. Other:  Small amount of free fluid but no overt ascites. Musculoskeletal: No significant findings. IMPRESSION: 1. MR findings consistent with mild acute uncomplicated pancreatitis. 2. Mild gallbladder distension and mild pericholecystic fluid but no gallbladder wall thickening or other definite findings for acute cholecystitis. 3. Stable appearing intra and extrahepatic biliary dilatation. Possible distal stricture. No common bile duct stones are identified. Normal main pancreatic duct. 4. Possible inflammatory changes involving the stomach which could suggest peptic ulcer disease or gastritis. Electronically Signed   By: Marijo Sanes M.D.   On: 03/31/2018 08:43   Mr Abdomen Mrcp Moise Boring  Contast  Result Date: 03/31/2018 CLINICAL DATA:  Acute pancreatitis. EXAM: MRI ABDOMEN WITHOUT AND WITH CONTRAST (INCLUDING MRCP) TECHNIQUE: Multiplanar multisequence  MR imaging of the abdomen was performed both before and after the administration of intravenous contrast. Heavily T2-weighted images of the biliary and pancreatic ducts were obtained, and three-dimensional MRCP images were rendered by post processing. CONTRAST:  8.4 cc Gadavist COMPARISON:  CT scan 03/30/2018 FINDINGS: Lower chest: The lung bases are clear of an acute process. The heart is normal in size. No pericardial effusion. Hepatobiliary: No focal hepatic lesions. Mild stable intrahepatic and extrahepatic biliary dilatation when compared to CT scan from 2019. The gallbladder is distended. No significant gallbladder wall thickening but there is mild pericholecystic fluid. Small gallstones suspected in the gallbladder neck. Intra and extrahepatic biliary dilatation. No common bile duct stones are identified. No ampullary mass or pancreatic head mass. There could be a distal stricture. This appears to be chronic. Pancreas: Mild peripancreatic inflammatory changes with a small amount of fluid in the anterior pararenal spaces. Normal pancreatic enhancement and normal caliber and course of the main pancreatic duct. Findings consistent with mild uncomplicated pancreatitis as seen on the CT scan. Spleen:  Normal size.  No focal lesions. Adrenals/Urinary Tract: The adrenal glands and kidneys are unremarkable. Stomach/Bowel: The gastric wall in the body and antral regions appears somewhat thickened and edematous. There is also moderate enhancement. Could not exclude peptic ulcer disease/gastritis. The visualized small bowel and colon are unremarkable. Vascular/Lymphatic: No pathologically enlarged lymph nodes identified. No abdominal aortic aneurysm demonstrated. Small scattered nodes, likely inflammatory. Other:  Small amount of free fluid but no overt  ascites. Musculoskeletal: No significant findings. IMPRESSION: 1. MR findings consistent with mild acute uncomplicated pancreatitis. 2. Mild gallbladder distension and mild pericholecystic fluid but no gallbladder wall thickening or other definite findings for acute cholecystitis. 3. Stable appearing intra and extrahepatic biliary dilatation. Possible distal stricture. No common bile duct stones are identified. Normal main pancreatic duct. 4. Possible inflammatory changes involving the stomach which could suggest peptic ulcer disease or gastritis. Electronically Signed   By: Marijo Sanes M.D.   On: 03/31/2018 08:43     Medical Consultants:    None.  Anti-Infectives:   IV Rocephin  Subjective:    [name] feels much better than yesterday he is tolerating his diet.  Objective:    Vitals:   03/31/18 1155 03/31/18 1531 03/31/18 2106 04/01/18 0443  BP: (!) 161/56 (!) 158/61 101/65 (!) 155/56  Pulse: 85 90 85 90  Resp: 18 18 16 20   Temp: 97.9 F (36.6 C) (!) 102.8 F (39.3 C) 98.9 F (37.2 C) (!) 102.9 F (39.4 C)  TempSrc: Oral Oral Oral Oral  SpO2: 96% 97% 93% 93%  Weight:  84 kg    Height:  5\' 9"  (1.753 m)      Intake/Output Summary (Last 24 hours) at 04/01/2018 0837 Last data filed at 04/01/2018 0300 Gross per 24 hour  Intake 1715.48 ml  Output --  Net 1715.48 ml   Filed Weights   03/31/18 0000 03/31/18 1531  Weight: 84.4 kg 84 kg    Exam: General exam: In no acute distress. Respiratory system: Good air movement and clear to auscultation. Cardiovascular system: S1 & S2 heard, RRR.  Gastrointestinal system: Abdomen is nondistended, soft and nontender.  Central nervous system: Alert and oriented. No focal neurological deficits. Extremities: No pedal edema. Skin: No rashes, lesions or ulcers Psychiatry: Judgement and insight appear normal. Mood & affect appropriate.    Data Reviewed:    Labs: Basic Metabolic Panel: Recent Labs  Lab  03/30/18 1616 03/31/18 0508 04/01/18  0406  NA 139 144 140  K 4.2 5.4* 3.8  CL 106 110 107  CO2 22 26 23   GLUCOSE 182* 124* 131*  BUN 20 29* 37*  CREATININE 1.59* 1.93* 2.15*  CALCIUM 8.9 8.2* 8.0*   GFR Estimated Creatinine Clearance: 29.7 mL/min (A) (by C-G formula based on SCr of 2.15 mg/dL (H)). Liver Function Tests: Recent Labs  Lab 03/30/18 1616 03/31/18 0508 04/01/18 0406  AST 70* 43* 58*  ALT 32 26 24  ALKPHOS 90 65 61  BILITOT 1.5* 2.1* 1.1  PROT 7.6 6.1* 6.1*  ALBUMIN 3.8 2.9* 2.7*   Recent Labs  Lab 03/30/18 1616 03/31/18 0508  LIPASE 390* 1,793*   No results for input(s): AMMONIA in the last 168 hours. Coagulation profile No results for input(s): INR, PROTIME in the last 168 hours.  CBC: Recent Labs  Lab 03/30/18 1616 03/31/18 0508  WBC 21.4* 12.6*  NEUTROABS  --  11.3*  HGB 13.2 11.7*  HCT 41.5 36.0*  MCV 87.7 87.4  PLT 186 PLATELET CLUMPS NOTED ON SMEAR, UNABLE TO ESTIMATE   Cardiac Enzymes: No results for input(s): CKTOTAL, CKMB, CKMBINDEX, TROPONINI in the last 168 hours. BNP (last 3 results) No results for input(s): PROBNP in the last 8760 hours. CBG: Recent Labs  Lab 03/31/18 1643 03/31/18 1956 04/01/18 0027 04/01/18 0358 04/01/18 0751  GLUCAP 151* 132* 123* 112* 96   D-Dimer: No results for input(s): DDIMER in the last 72 hours. Hgb A1c: Recent Labs    03/31/18 0109  HGBA1C 7.0*   Lipid Profile: Recent Labs    03/31/18 0508  CHOL 80  HDL 32*  LDLCALC 34  TRIG 72  CHOLHDL 2.5   Thyroid function studies: No results for input(s): TSH, T4TOTAL, T3FREE, THYROIDAB in the last 72 hours.  Invalid input(s): FREET3 Anemia work up: No results for input(s): VITAMINB12, FOLATE, FERRITIN, TIBC, IRON, RETICCTPCT in the last 72 hours. Sepsis Labs: Recent Labs  Lab 03/30/18 1616 03/30/18 2212 03/31/18 0109 03/31/18 0508  WBC 21.4*  --   --  12.6*  LATICACIDVEN  --  2.4* 1.7 2.2*   Microbiology Recent Results (from the  past 240 hour(s))  Culture, blood (routine x 2)     Status: None (Preliminary result)   Collection Time: 03/30/18 10:12 PM  Result Value Ref Range Status   Specimen Description BLOOD RIGHT FOREARM  Final   Special Requests   Final    BOTTLES DRAWN AEROBIC AND ANAEROBIC Blood Culture adequate volume   Culture  Setup Time   Final    Organism ID to follow GRAM NEGATIVE RODS CRITICAL RESULT CALLED TO, READ BACK BY AND VERIFIED WITH: PHRMD M TURNER @1311  03/31/2018 BY S GEZAHEGN Performed at [facility], [address], [place],  [address]    Culture GRAM NEGATIVE RODS  Final   Report Status PENDING  Incomplete  Culture, blood (routine x 2)     Status: None (Preliminary result)   Collection Time: 03/30/18 10:12 PM  Result Value Ref Range Status   Specimen Description BLOOD RIGHT HAND  Final   Special Requests   Final    BOTTLES DRAWN AEROBIC AND ANAEROBIC Blood Culture adequate volume   Culture  Setup Time   Final    GRAM NEGATIVE RODS AEROBIC BOTTLE ONLY CRITICAL VALUE NOTED.  VALUE IS CONSISTENT WITH PREVIOUSLY REPORTED AND CALLED VALUE. Performed at [facility], [place] [address] [place],  [address]    Culture GRAM NEGATIVE RODS  Final   Report Status PENDING  Incomplete  Blood Culture ID Panel (Reflexed)     Status: Abnormal   Collection Time: 03/30/18 10:12 PM  Result Value Ref Range Status   Enterococcus species NOT DETECTED NOT DETECTED Final   Listeria monocytogenes NOT DETECTED NOT DETECTED Final   Staphylococcus species NOT DETECTED NOT DETECTED Final   Staphylococcus aureus (BCID) NOT DETECTED NOT DETECTED Final   Streptococcus species NOT DETECTED NOT DETECTED Final   Streptococcus agalactiae NOT DETECTED NOT DETECTED Final   Streptococcus pneumoniae NOT DETECTED NOT DETECTED Final   Streptococcus pyogenes NOT DETECTED NOT DETECTED Final   Acinetobacter baumannii NOT DETECTED NOT DETECTED Final   Enterobacteriaceae species DETECTED (A) NOT  DETECTED Final    Comment: Enterobacteriaceae represent a large family of gram-negative bacteria, not a single organism. CRITICAL RESULT CALLED TO, READ BACK BY AND VERIFIED WITH: PHRMD M TURNER @1311  03/31/2018 BY S GEZAHEGN    Enterobacter cloacae complex NOT DETECTED NOT DETECTED Final   Escherichia coli DETECTED (A) NOT DETECTED Final    Comment: CRITICAL RESULT CALLED TO, READ BACK BY AND VERIFIED WITH: PHRMD M TURNER @1311  03/31/2018 BY S GEZAHEGN    Klebsiella oxytoca NOT DETECTED NOT DETECTED Final   Klebsiella pneumoniae NOT DETECTED NOT DETECTED Final   Proteus species NOT DETECTED NOT DETECTED Final   Serratia marcescens NOT DETECTED NOT DETECTED Final   Carbapenem resistance NOT DETECTED NOT DETECTED Final   Haemophilus influenzae NOT DETECTED NOT DETECTED Final   Neisseria meningitidis NOT DETECTED NOT DETECTED Final   Pseudomonas aeruginosa NOT DETECTED NOT DETECTED Final   Candida albicans NOT DETECTED NOT DETECTED Final   Candida glabrata NOT DETECTED NOT DETECTED Final   Candida krusei NOT DETECTED NOT DETECTED Final   Candida parapsilosis NOT DETECTED NOT DETECTED Final   Candida tropicalis NOT DETECTED NOT DETECTED Final    Comment: Performed at [facility], [place] [address], [place], [place] [address]     Medications:    [MAR Hold] enoxaparin (LOVENOX) injection  40 mg Subcutaneous H29J   [MAR Hold] folic acid  242 mcg Oral Daily   [MAR Hold] indomethacin  100 mg Rectal Once   [MAR Hold] isosorbide mononitrate  60 mg Oral Daily   [MAR Hold] nebivolol  10 mg Oral Daily   [MAR Hold] pantoprazole  40 mg Oral Daily   Continuous Infusions:  [MAR Hold] cefTRIAXone (ROCEPHIN)  IV Stopped (03/31/18 2139)   lactated ringers 200 mL/hr at 04/01/18 0633   [MAR Hold] metronidazole 500 mg (04/01/18 0753)     LOS: 1 day   Charlynne Cousins  [facility]  04/01/2018, 8:37 AM

## 2018-04-02 ENCOUNTER — Encounter (HOSPITAL_COMMUNITY): Payer: Self-pay | Admitting: Gastroenterology

## 2018-04-02 ENCOUNTER — Inpatient Hospital Stay: Payer: Self-pay

## 2018-04-02 LAB — COMPREHENSIVE METABOLIC PANEL
ALT: 22 U/L (ref 0–44)
AST: 49 U/L — ABNORMAL HIGH (ref 15–41)
Albumin: 2.3 g/dL — ABNORMAL LOW (ref 3.5–5.0)
Alkaline Phosphatase: 47 U/L (ref 38–126)
Anion gap: 8 (ref 5–15)
BUN: 41 mg/dL — ABNORMAL HIGH (ref 8–23)
CHLORIDE: 110 mmol/L (ref 98–111)
CO2: 21 mmol/L — ABNORMAL LOW (ref 22–32)
Calcium: 7.5 mg/dL — ABNORMAL LOW (ref 8.9–10.3)
Creatinine, Ser: 2.27 mg/dL — ABNORMAL HIGH (ref 0.61–1.24)
GFR calc non Af Amer: 27 mL/min — ABNORMAL LOW (ref >60)
GFR, EST AFRICAN AMERICAN: 32 mL/min — AB (ref >60)
Glucose, Bld: 146 mg/dL — ABNORMAL HIGH (ref 70–99)
Potassium: 3.4 mmol/L — ABNORMAL LOW (ref 3.5–5.1)
Sodium: 139 mmol/L (ref 135–145)
Total Bilirubin: 0.6 mg/dL (ref 0.3–1.2)
Total Protein: 5.6 g/dL — ABNORMAL LOW (ref 6.5–8.1)

## 2018-04-02 LAB — GLUCOSE, CAPILLARY
Glucose-Capillary: 123 mg/dL — ABNORMAL HIGH (ref 70–99)
Glucose-Capillary: 110 mg/dL — ABNORMAL HIGH (ref 70–99)
Glucose-Capillary: 103 mg/dL — ABNORMAL HIGH (ref 70–99)
Glucose-Capillary: 136 mg/dL — ABNORMAL HIGH (ref 70–99)
Glucose-Capillary: 121 mg/dL — ABNORMAL HIGH (ref 70–99)

## 2018-04-02 LAB — CULTURE, BLOOD (ROUTINE X 2)
SPECIAL REQUESTS: ADEQUATE
Special Requests: ADEQUATE

## 2018-04-02 LAB — C-REACTIVE PROTEIN: CRP: 9.9 mg/dL — AB (ref <1.0)

## 2018-04-02 LAB — SEDIMENTATION RATE: SED RATE: 73 mm/h — AB (ref 0–16)

## 2018-04-02 LAB — LACTIC ACID, PLASMA: Lactic Acid, Venous: 1.3 mmol/L (ref 0.5–1.9)

## 2018-04-02 LAB — PREALBUMIN: Prealbumin: 9.4 mg/dL — ABNORMAL LOW (ref 18–38)

## 2018-04-02 MED ORDER — SODIUM CHLORIDE 0.9 % IV SOLN
INTRAVENOUS | Status: DC
  Administered 2018-04-02 (×2): via INTRAVENOUS

## 2018-04-02 MED ORDER — POTASSIUM CHLORIDE CRYS ER 20 MEQ PO TBCR: 40.0000 meq | EXTENDED_RELEASE_TABLET | Freq: Two times a day (BID) | ORAL | Status: DC

## 2018-04-02 MED ORDER — SODIUM CHLORIDE 0.9 % IV SOLN
1.0000 g | Freq: Two times a day (BID) | INTRAVENOUS | Status: DC
  Administered 2018-04-02 – 2018-04-03 (×3): 1 g via INTRAVENOUS
  Filled 2018-04-02 (×3): qty 1

## 2018-04-02 MED ORDER — POTASSIUM CHLORIDE CRYS ER 20 MEQ PO TBCR
40.0000 meq | EXTENDED_RELEASE_TABLET | Freq: Every day | ORAL | Status: DC
  Administered 2018-04-02 – 2018-04-06 (×5): 40 meq via ORAL
  Filled 2018-04-02 (×5): qty 2

## 2018-04-02 NOTE — Progress Notes (Signed)
Spoke with RN re PICC order for d/c home on antibiotic therapy.  States not planning for discharge home today.

## 2018-04-02 NOTE — Progress Notes (Signed)
Pharmacy Antibiotic Note  [name] is a 78 y.o. male admitted on 03/30/2018 with ESBL Ecoli bacteremia.  Pharmacy has been consulted for Merrem dosing. Cefepime and Flagyl have been discontinued. SCr continues to trend up at 2.27 with estimated CrCl~48mL/min. WBC has been trending down. Lactic acid is down. Currently patient is afebrile. S/p ERCP on 3/13 with stricture and sludge but no stones. Biopsies were sent.   Plan: Merrem 1g IV every 12 hours.  Monitor renal function, clinical status.  Need Fluconazole?   Height: 5\' 9"  (175.3 cm) Weight: 185 lb 3 oz (84 kg) IBW/kg (Calculated) : 70.7  Temp (24hrs), Avg:98.6 F (37 C), Min:98 F (36.7 C), Max:99.8 F (37.7 C)  Recent Labs  Lab 03/30/18 1616 03/30/18 2212 03/31/18 0109 03/31/18 0508 04/01/18 0406 04/01/18 0754 04/02/18 0326  WBC 21.4*  --   --  12.6*  --  11.4*  --   CREATININE 1.59*  --   --  1.93* 2.15*  --  2.27*  LATICACIDVEN  --  2.4* 1.7 2.2*  --   --  1.3    Estimated Creatinine Clearance: 28.1 mL/min (A) (by C-G formula based on SCr of 2.27 mg/dL (H)).    Allergies  Allergen Reactions  . Losartan Potassium Other (See Comments)    Hyperkalemia  . Tape Rash    Itching, reddened skin Itching, reddened skin    Antimicrobials this admission: Cefepime 3/12 >> 3/14 Flagyl 3/12 >> 3/14 Merrem 3/14 >> Fluconazole 3/13 >>  Dose adjustments this admission:   Microbiology results: 3/14 BCx >> ESBL EColi   Thank you for allowing pharmacy to be a part of this patient's care.  Brain Hilts 04/02/2018 10:20 AM

## 2018-04-02 NOTE — Plan of Care (Signed)
  Problem: Clinical Measurements: Goal: Ability to maintain clinical measurements within normal limits will improve Outcome: Progressing   Problem: Nutrition: Goal: Adequate nutrition will be maintained Outcome: Progressing   Problem: Pain Managment: Goal: General experience of comfort will improve Outcome: Progressing   

## 2018-04-02 NOTE — Progress Notes (Signed)
[facility] PROGRESS NOTE    Progress Note  [name]  JAS:[id number] DOB: [birth year] DOA: 03/30/2018 PCP: Isaac Bliss, Rayford Halsted, MD     Brief Narrative:   [name] is an 78 y.o. male past medical history of CAD, bladder cancer essential hypertension, esophageal stenosis status post dilation who presents with complaint of epigastric pain after eating was found to have biliary pancreatitis  Assessment/Plan:   Sepsis ([place]) to ascending cholangitis/gram-negative bacteremia: He was fluid resuscitated and started empirically on IV Rocephin and Flagyl. Has remained afebrile Blood cultures were positive for ESBL E. coli, change antibiotics to IV meropenem. GI was consulted recommended an ERCP on 04/01/2018 show stricture and sludge no stones.  Biopsies were done and results are pending. Liver enzymes are trending down, his CRP was 9.9, lactic acid is less than 2.  Acute biliary pancreatitis: CT scan and MRCP showed acute pancreatitis with a 50 mm dilation of the common bile duct near the ampulla. ERP CP performed with sphincterotomy and stent placement. Vance diet as tolerated. We will consult surgery for possible cholecystectomy. We will insert a PICC line as will need 2 weeks of IV Merrem meropenem.  As his blood cultures grew ESBL. Denies any diarrhea.  Esophageal candidiasis: Cont. IV Diflucan.  Acute kidney injury on  CKD (chronic kidney disease), stage III ([place]): With a baseline creatinine around 1.3-1.6.   In  the setting of sepsis, NSAID use and IV contrast (03/30/2018) and possibly IV vancomycin.  Vancomycin has been discontinued. Continue aggressive IV fluid hydration, his creatinine has plateaued today is 2.2. Monitor strict I's and O's.  He relates he has put out more than a liter as per patient. I's and O's poorly recorded.  HyPerkalemia: Hypokalemic will replete orally.  Essential hypertension Continue to hold Lasix. Continue Bystolic,  blood pressure mildly elevated will continue to monitor.  History of esophagitis: Protonix oral.  Dyslipidemia: Hold simvastatin and Zetia.   DVT prophylaxis: lovenxo Family Communication:daughter Disposition Plan/Barrier to D/C: unable to determine Code Status:     Code Status Orders  (From admission, onward)         Start     Ordered   03/30/18 2350  Full code  Continuous     03/30/18 2353        Code Status History    Date Active Date Inactive Code Status Order ID Comments User Context   11/24/2016 2337 11/25/2016 1700 Full Code [id number]  Avelina Laine, PA-C Inpatient        IV Access:    Peripheral IV   Procedures and diagnostic studies:   Dg Ercp Biliary & Pancreatic Ducts  Result Date: 04/01/2018 CLINICAL DATA:  78 year old male with a history of cholelithiasis EXAM: ERCP TECHNIQUE: Multiple spot images obtained with the fluoroscopic device and submitted for interpretation post-procedure. FLUOROSCOPY TIME:  Fluoroscopy Time:  2 minutes 43 seconds COMPARISON:  MR 03/31/2018 FINDINGS: Limited intraoperative images of ERCP. Initial image demonstrates endoscope projecting over the upper abdomen none, with safety wire in position of partial opacification of the extrahepatic biliary ducts. Subsequently there is retrieval balloon deployed and then placement of plastic biliary stent. IMPRESSION: Limited images during ERCP demonstrates partial opacification of the extrahepatic biliary ducts, deployment of a retrieval balloon, and placement of plastic biliary stent Please refer to the dictated operative report for full details of intraoperative findings and procedure. Electronically Signed   By: Corrie Mckusick D.O.   On: 04/01/2018 12:39     Medical Consultants:  None.  Anti-Infectives:   IV Rocephin  Subjective:    [name] he relates he had a rough night otherwise he feels better tolerating his diet he is in a good mood that he is going to try his  regular food.  Objective:    Vitals:   04/01/18 1230 04/01/18 1641 04/01/18 2027 04/02/18 0522  BP: (!) 152/61 139/69 (!) 159/96 (!) 157/78  Pulse: 78 70 71 93  Resp: (!) 21 16 19 16   Temp:  98 F (36.7 C) 98.1 F (36.7 C) 98.6 F (37 C)  TempSrc:   Oral Oral  SpO2: 94% 98% 98% 95%  Weight:      Height:        Intake/Output Summary (Last 24 hours) at 04/02/2018 0946 Last data filed at 04/02/2018 0622 Gross per 24 hour  Intake 3480.11 ml  Output 550 ml  Net 2930.11 ml   Filed Weights   03/31/18 0000 03/31/18 1531  Weight: 84.4 kg 84 kg    Exam: General exam: In no acute distress. Respiratory system: Good air movement and clear to auscultation. Cardiovascular system: S1 & S2 heard, RRR.  Gastrointestinal system: Abdomen is nondistended, soft and nontender.  Central nervous system: Alert and oriented. No focal neurological deficits. Extremities: No pedal edema. Skin: No rashes, lesions or ulcers Psychiatry: Judgement and insight appear normal. Mood & affect appropriate.    Data Reviewed:    Labs: Basic Metabolic Panel: Recent Labs  Lab 03/30/18 1616 03/31/18 0508 04/01/18 0406 04/02/18 0326  NA 139 144 140 139  K 4.2 5.4* 3.8 3.4*  CL 106 110 107 110  CO2 22 26 23  21*  GLUCOSE 182* 124* 131* 146*  BUN 20 29* 37* 41*  CREATININE 1.59* 1.93* 2.15* 2.27*  CALCIUM 8.9 8.2* 8.0* 7.5*   GFR Estimated Creatinine Clearance: 28.1 mL/min (A) (by C-G formula based on SCr of 2.27 mg/dL (H)). Liver Function Tests: Recent Labs  Lab 03/30/18 1616 03/31/18 0508 04/01/18 0406 04/02/18 0326  AST 70* 43* 58* 49*  ALT 32 26 24 22   ALKPHOS 90 65 61 47  BILITOT 1.5* 2.1* 1.1 0.6  PROT 7.6 6.1* 6.1* 5.6*  ALBUMIN 3.8 2.9* 2.7* 2.3*   Recent Labs  Lab 03/30/18 1616 03/31/18 0508  LIPASE 390* 1,793*   No results for input(s): AMMONIA in the last 168 hours. Coagulation profile No results for input(s): INR, PROTIME in the last 168 hours.  CBC: Recent Labs   Lab 03/30/18 1616 03/31/18 0508 04/01/18 0754  WBC 21.4* 12.6* 11.4*  NEUTROABS  --  11.3* 10.1*  HGB 13.2 11.7* 9.6*  HCT 41.5 36.0* 31.1*  MCV 87.7 87.4 89.1  PLT 186 PLATELET CLUMPS NOTED ON SMEAR, UNABLE TO ESTIMATE PLATELET CLUMPS NOTED ON SMEAR, UNABLE TO ESTIMATE   Cardiac Enzymes: No results for input(s): CKTOTAL, CKMB, CKMBINDEX, TROPONINI in the last 168 hours. BNP (last 3 results) No results for input(s): PROBNP in the last 8760 hours. CBG: Recent Labs  Lab 04/01/18 1649 04/01/18 2200 04/01/18 2357 04/02/18 0405 04/02/18 0748  GLUCAP 162* 135* 145* 123* 110*   D-Dimer: No results for input(s): DDIMER in the last 72 hours. Hgb A1c: Recent Labs    03/31/18 0109  HGBA1C 7.0*   Lipid Profile: Recent Labs    03/31/18 0508  CHOL 80  HDL 32*  LDLCALC 34  TRIG 72  CHOLHDL 2.5   Thyroid function studies: No results for input(s): TSH, T4TOTAL, T3FREE, THYROIDAB in the last 72 hours.  Invalid input(s): FREET3 Anemia work up: No results for input(s): VITAMINB12, FOLATE, FERRITIN, TIBC, IRON, RETICCTPCT in the last 72 hours. Sepsis Labs: Recent Labs  Lab 03/30/18 1616 03/30/18 2212 03/31/18 0109 03/31/18 0508 04/01/18 0754 04/02/18 0326  WBC 21.4*  --   --  12.6* 11.4*  --   LATICACIDVEN  --  2.4* 1.7 2.2*  --  1.3   Microbiology Recent Results (from the past 240 hour(s))  Culture, blood (routine x 2)     Status: Abnormal   Collection Time: 03/30/18 10:12 PM  Result Value Ref Range Status   Specimen Description BLOOD RIGHT FOREARM  Final   Special Requests   Final    BOTTLES DRAWN AEROBIC AND ANAEROBIC Blood Culture adequate volume   Culture  Setup Time   Final    GRAM NEGATIVE RODS CRITICAL RESULT CALLED TO, READ BACK BY AND VERIFIED WITH: PHRMD M TURNER @1311  03/31/2018 BY S GEZAHEGN Performed at [facility], [place] [address], [place], [place] [address]    Culture (A)  Final    ESCHERICHIA COLI Confirmed Extended Spectrum  Beta-Lactamase Producer (ESBL).  In bloodstream infections from ESBL organisms, carbapenems are preferred over piperacillin/tazobactam. They are shown to have a lower risk of mortality.    Report Status 04/02/2018 FINAL  Final   Organism ID, Bacteria ESCHERICHIA COLI  Final      Susceptibility   Escherichia coli - MIC*    AMPICILLIN >=32 RESISTANT Resistant     CEFAZOLIN >=64 RESISTANT Resistant     CEFEPIME RESISTANT Resistant     CEFTAZIDIME RESISTANT Resistant     CEFTRIAXONE >=64 RESISTANT Resistant     CIPROFLOXACIN >=4 RESISTANT Resistant     GENTAMICIN <=1 SENSITIVE Sensitive     IMIPENEM <=0.25 SENSITIVE Sensitive     TRIMETH/SULFA <=20 SENSITIVE Sensitive     AMPICILLIN/SULBACTAM >=32 RESISTANT Resistant     PIP/TAZO 8 SENSITIVE Sensitive     Extended ESBL POSITIVE Resistant     * ESCHERICHIA COLI  Culture, blood (routine x 2)     Status: Abnormal   Collection Time: 03/30/18 10:12 PM  Result Value Ref Range Status   Specimen Description BLOOD RIGHT HAND  Final   Special Requests   Final    BOTTLES DRAWN AEROBIC AND ANAEROBIC Blood Culture adequate volume   Culture  Setup Time   Final    GRAM NEGATIVE RODS AEROBIC BOTTLE ONLY CRITICAL VALUE NOTED.  VALUE IS CONSISTENT WITH PREVIOUSLY REPORTED AND CALLED VALUE. Performed at [facility], [place] [address] [place], [place] [address]    Culture ESCHERICHIA COLI (A)  Final   Report Status 04/02/2018 FINAL  Final  Blood Culture ID Panel (Reflexed)     Status: Abnormal   Collection Time: 03/30/18 10:12 PM  Result Value Ref Range Status   Enterococcus species NOT DETECTED NOT DETECTED Final   Listeria monocytogenes NOT DETECTED NOT DETECTED Final   Staphylococcus species NOT DETECTED NOT DETECTED Final   Staphylococcus aureus (BCID) NOT DETECTED NOT DETECTED Final   Streptococcus species NOT DETECTED NOT DETECTED Final   Streptococcus agalactiae NOT DETECTED NOT DETECTED Final   Streptococcus pneumoniae NOT DETECTED NOT  DETECTED Final   Streptococcus pyogenes NOT DETECTED NOT DETECTED Final   Acinetobacter baumannii NOT DETECTED NOT DETECTED Final   Enterobacteriaceae species DETECTED (A) NOT DETECTED Final    Comment: Enterobacteriaceae represent a large family of gram-negative bacteria, not a single organism. CRITICAL RESULT CALLED TO, READ BACK BY AND  VERIFIED WITH: PHRMD M TURNER @1311  03/31/2018 BY S GEZAHEGN    Enterobacter cloacae complex NOT DETECTED NOT DETECTED Final   Escherichia coli DETECTED (A) NOT DETECTED Final    Comment: CRITICAL RESULT CALLED TO, READ BACK BY AND VERIFIED WITH: PHRMD M TURNER @1311  03/31/2018 BY S GEZAHEGN    Klebsiella oxytoca NOT DETECTED NOT DETECTED Final   Klebsiella pneumoniae NOT DETECTED NOT DETECTED Final   Proteus species NOT DETECTED NOT DETECTED Final   Serratia marcescens NOT DETECTED NOT DETECTED Final   Carbapenem resistance NOT DETECTED NOT DETECTED Final   Haemophilus influenzae NOT DETECTED NOT DETECTED Final   Neisseria meningitidis NOT DETECTED NOT DETECTED Final   Pseudomonas aeruginosa NOT DETECTED NOT DETECTED Final   Candida albicans NOT DETECTED NOT DETECTED Final   Candida glabrata NOT DETECTED NOT DETECTED Final   Candida krusei NOT DETECTED NOT DETECTED Final   Candida parapsilosis NOT DETECTED NOT DETECTED Final   Candida tropicalis NOT DETECTED NOT DETECTED Final    Comment: Performed at [facility], [place] [address], [place], [place] [address]     Medications:    enoxaparin (LOVENOX) injection  40 mg Subcutaneous W10U   folic acid  725 mcg Oral Daily   isosorbide mononitrate  60 mg Oral Daily   nebivolol  10 mg Oral Daily   pantoprazole  40 mg Oral Daily   Continuous Infusions:  cefTRIAXone (ROCEPHIN)  IV 2 g (04/01/18 2146)   fluconazole (DIFLUCAN) IV 100 mg (04/01/18 1623)   lactated ringers 200 mL/hr at 04/01/18 0633     LOS: 2 days   Charlynne Cousins  [facility]  04/02/2018, 9:46 AM

## 2018-04-02 NOTE — Progress Notes (Signed)
Spoke with Rn re PICC plan to be placed 04-03-18

## 2018-04-02 NOTE — Progress Notes (Signed)
Messaged Dr Aileen Fass  Re blood cultures, states no need to repeat.  Will place PICC when able.

## 2018-04-02 NOTE — Progress Notes (Signed)
[facility] Subjective: Patient seems to be doing fairly well after an ERCP yesterday. The LFT's are improving and he denies having any abdominal pain, nausea or vomiting. He feels bloated. He has been having several small volume BM's per day but denies having any melena or hematochezia. He is tolerating clears well and is waiting for his breakfast to come up. He is to get a PICC ljne prior to discharge  So he can complete his antibiotics for the sepsis he presented with noted to be from [facility].  Objective: Vital signs in last 24 hours: Temp:  [98 F (36.7 C)-99.8 F (37.7 C)] 98.6 F (37 C) (03/14 0522) Pulse Rate:  [70-93] 93 (03/14 0522) Resp:  [13-21] 16 (03/14 0522) BP: (138-160)/(60-96) 157/78 (03/14 0522) SpO2:  [94 %-100 %] 95 % (03/14 0522) Last BM Date: 04/02/18  Intake/Output from previous day: 03/13 0701 - 03/14 0700 In: 3480.1 [P.O.:1500; I.V.:1830.1; IV Piggyback:150] Out: 550 [Urine:550] Intake/Output this shift: Total I/O In: 120 [P.O.:120] Out: -   General appearance: alert, cooperative, appears stated age, morbidly obese and pale Resp: clear to auscultation bilaterally Cardio: regular rate and rhythm, S1, S2 normal, no murmur, click, rub or gallop GI: soft with miminal RUQ tenderness on palpation but without gaurding, rebound or rigidity; bowel sounds hypoactive; no masses,  no organomegaly Extremities: extremities normal, atraumatic, no cyanosis or edema  Lab Results: Recent Labs    03/30/18 1616 03/31/18 0508 04/01/18 0754  WBC 21.4* 12.6* 11.4*  HGB 13.2 11.7* 9.6*  HCT 41.5 36.0* 31.1*  PLT 186 PLATELET CLUMPS NOTED ON SMEAR, UNABLE TO ESTIMATE PLATELET CLUMPS NOTED ON SMEAR, UNABLE TO ESTIMATE   BMET Recent Labs    03/31/18 0508 04/01/18 0406 04/02/18 0326  NA 144 140 139  K 5.4* 3.8 3.4*  CL 110 107 110  CO2 26 23 21*  GLUCOSE 124* 131* 146*  BUN 29* 37* 41*  CREATININE 1.93* 2.15* 2.27*  CALCIUM 8.2* 8.0* 7.5*   LFT Recent Labs     04/02/18 0326  PROT 5.6*  ALBUMIN 2.3*  AST 49*  ALT 22  ALKPHOS 47  BILITOT 0.6   Studies/Results: Dg Ercp Biliary & Pancreatic Ducts  Result Date: 04/01/2018 CLINICAL DATA:  78 year old male with a history of cholelithiasis EXAM: ERCP TECHNIQUE: Multiple spot images obtained with the fluoroscopic device and submitted for interpretation post-procedure. FLUOROSCOPY TIME:  Fluoroscopy Time:  2 minutes 43 seconds COMPARISON:  MR 03/31/2018 FINDINGS: Limited intraoperative images of ERCP. Initial image demonstrates endoscope projecting over the upper abdomen none, with safety wire in position of partial opacification of the extrahepatic biliary ducts. Subsequently there is retrieval balloon deployed and then placement of plastic biliary stent. IMPRESSION: Limited images during ERCP demonstrates partial opacification of the extrahepatic biliary ducts, deployment of a retrieval balloon, and placement of plastic biliary stent Please refer to the dictated operative report for full details of intraoperative findings and procedure. Electronically Signed   By: Corrie Mckusick D.O.   On: 04/01/2018 12:39   [place] Ekg Site Rite  Result Date: 04/02/2018 If Site Rite image not attached, placement could not be confirmed due to current cardiac rhythm.  Medications: I have reviewed the patient's current medications.  Assessment/Plan: 1) Acute biliary pancreatitis with dilated common bile duct ERCP with sphincterotomy and stent stent placement performed yesterday.  Patient seems to be doing really well today he is tolerating his diet without any problems no nausea vomiting.  Elective laparoscopic cholecystectomy is planned in the near future  2) Sepsis with ascending cholangitis and gram-negative bacteremia-patient will be discharged home with a PICC line to finish his course of antibiotics at home. 3) Esophageal candidiasis on IV Diflucan/History of reflux esophagitis on PPI. 4) Acute kidney injury on chronic  kidney disease stage III. 5) Essential hypertension  LOS: 2 days   Perlie Scheuring 04/02/2018, 10:28 AM

## 2018-04-03 DIAGNOSIS — Z01810 Encounter for preprocedural cardiovascular examination: Secondary | ICD-10-CM

## 2018-04-03 LAB — BASIC METABOLIC PANEL
Anion gap: 7 (ref 5–15)
BUN: 32 mg/dL — ABNORMAL HIGH (ref 8–23)
CO2: 21 mmol/L — AB (ref 22–32)
Calcium: 7.7 mg/dL — ABNORMAL LOW (ref 8.9–10.3)
Chloride: 111 mmol/L (ref 98–111)
Creatinine, Ser: 1.88 mg/dL — ABNORMAL HIGH (ref 0.61–1.24)
GFR calc Af Amer: 40 mL/min — ABNORMAL LOW (ref >60)
GFR calc non Af Amer: 34 mL/min — ABNORMAL LOW (ref >60)
Glucose, Bld: 197 mg/dL — ABNORMAL HIGH (ref 70–99)
POTASSIUM: 3.6 mmol/L (ref 3.5–5.1)
Sodium: 139 mmol/L (ref 135–145)

## 2018-04-03 LAB — GLUCOSE, CAPILLARY
Glucose-Capillary: 131 mg/dL — ABNORMAL HIGH (ref 70–99)
Glucose-Capillary: 132 mg/dL — ABNORMAL HIGH (ref 70–99)
Glucose-Capillary: 133 mg/dL — ABNORMAL HIGH (ref 70–99)
Glucose-Capillary: 136 mg/dL — ABNORMAL HIGH (ref 70–99)
Glucose-Capillary: 165 mg/dL — ABNORMAL HIGH (ref 70–99)
Glucose-Capillary: 176 mg/dL — ABNORMAL HIGH (ref 70–99)

## 2018-04-03 LAB — LIPASE, BLOOD: LIPASE: 174 U/L — AB (ref 11–51)

## 2018-04-03 MED ORDER — SODIUM CHLORIDE 0.9 % IV SOLN
2.0000 g | Freq: Two times a day (BID) | INTRAVENOUS | Status: DC
  Administered 2018-04-03 – 2018-04-06 (×6): 2 g via INTRAVENOUS
  Filled 2018-04-03 (×6): qty 2

## 2018-04-03 MED ORDER — ROSUVASTATIN CALCIUM 20 MG PO TABS
20.0000 mg | ORAL_TABLET | Freq: Every evening | ORAL | Status: DC
  Administered 2018-04-03 – 2018-04-05 (×3): 20 mg via ORAL
  Filled 2018-04-03 (×3): qty 1

## 2018-04-03 MED ORDER — SODIUM CHLORIDE 0.9% FLUSH
10.0000 mL | Freq: Two times a day (BID) | INTRAVENOUS | Status: DC
  Administered 2018-04-04 – 2018-04-06 (×4): 10 mL

## 2018-04-03 MED ORDER — HYDROCHLOROTHIAZIDE 12.5 MG PO CAPS
12.5000 mg | ORAL_CAPSULE | Freq: Every day | ORAL | Status: DC
  Administered 2018-04-03 – 2018-04-05 (×3): 12.5 mg via ORAL
  Filled 2018-04-03 (×3): qty 1

## 2018-04-03 MED ORDER — SODIUM CHLORIDE 0.9% FLUSH
10.0000 mL | INTRAVENOUS | Status: DC | PRN
  Administered 2018-04-03: 10 mL
  Filled 2018-04-03: qty 40

## 2018-04-03 MED ORDER — ROPINIROLE HCL 1 MG PO TABS
2.0000 mg | ORAL_TABLET | Freq: Every day | ORAL | Status: DC
  Administered 2018-04-03 – 2018-04-05 (×3): 2 mg via ORAL
  Filled 2018-04-03 (×3): qty 2

## 2018-04-03 NOTE — Progress Notes (Signed)
Spoke with RN re PICC still to be placed for d/c home.  No d/c plans for today.

## 2018-04-03 NOTE — Progress Notes (Signed)
Pharmacy Antibiotic Note  [name] is a 78 y.o. male admitted on 03/30/2018 with ESBL Ecoli bacteremia.  Pharmacy has been consulted for Merrem dosing. Cefepime and Flagyl have been discontinued.S/p ERCP on 3/13 with stricture and sludge but no stones. Biopsies were sent.   SCr is improving at 1.88 with good urine output charted.  WBC has been trending down.  Lactic acid is down.  Currently patient is afebrile.   Plan: Increase Merrem to 2g IV every 12 hours with improved renal function.  Monitor renal function, clinical status.  Need Fluconazole?   Height: 5\' 9"  (175.3 cm) Weight: 185 lb 3 oz (84 kg) IBW/kg (Calculated) : 70.7  Temp (24hrs), Avg:99.9 F (37.7 C), Min:99.6 F (37.6 C), Max:100.1 F (37.8 C)  Recent Labs  Lab 03/30/18 1616 03/30/18 2212 03/31/18 0109 03/31/18 0508 04/01/18 0406 04/01/18 0754 04/02/18 0326 04/03/18 0412  WBC 21.4*  --   --  12.6*  --  11.4*  --   --   CREATININE 1.59*  --   --  1.93* 2.15*  --  2.27* 1.88*  LATICACIDVEN  --  2.4* 1.7 2.2*  --   --  1.3  --     Estimated Creatinine Clearance: 34 mL/min (A) (by C-G formula based on SCr of 1.88 mg/dL (H)).    Allergies  Allergen Reactions  . Losartan Potassium Other (See Comments)    Hyperkalemia  . Tape Rash    Itching, reddened skin Itching, reddened skin    Antimicrobials this admission: Cefepime 3/12 >> 3/14 Flagyl 3/12 >> 3/14 Merrem 3/14 >> Fluconazole 3/13 >>  Dose adjustments this admission:   Microbiology results: 3/14 BCx >> ESBL EColi   Thank you for allowing pharmacy to be a part of this patient's care.  Brain Hilts 04/03/2018 10:18 AM

## 2018-04-03 NOTE — Evaluation (Signed)
Physical Therapy Evaluation Patient Details Name: [name] MRN: [id number] DOB: 1940-08-09 Today's Date: 04/03/2018   History of Present Illness  [name] is a 78 y.o. male with medical history significant for coronary artery disease, bladder cancer, hypertension, Barrett esophagus, hiatal hernia, and GERD,  presented to the emergency department for evaluation of severe upper abdominal pain with nausea and shaking chills. Acute biliary pancreatitis with dilated common bile duct ERCP with sphincterotomy and stent stent placement; Doctors are considering surgery for lap cholecystectomy  Clinical Impression   Pt admitted with above diagnosis. Pt currently with functional limitations due to the deficits listed below (see PT Problem List). Independent prior to admission; still working as a Environmental education officer; Lives alone; Presents overall moving well, walking hallway; noting fatigue with hallway amb, and incr in pain (reports chronic pain);  Pt will benefit from skilled PT to increase their independence and safety with mobility to allow discharge to the venue listed below.       Follow Up Recommendations No PT follow up;Other (comment);Supervision/Assistance - 24 hour(HHRN for IV antibiotics) Daughter is concerned about his ability to manage at home and offers her home as a place he can stay with 24 hours assistance  Would he benefit form referral to pain specialist?   Equipment Recommendations  Rolling walker with 5" wheels    Recommendations for Other Services       Precautions / Restrictions        Mobility  Bed Mobility                  Transfers Overall transfer level: Needs assistance Equipment used: None Transfers: Sit to/from Stand Sit to Stand: Supervision         General transfer comment: Supervision for safety  Ambulation/Gait Ambulation/Gait assistance: Min guard Gait Distance (Feet): 250 Feet Assistive device: IV Pole Gait Pattern/deviations:  Step-through pattern Gait velocity: slowed   General Gait Details: Cues to self-monitor for activity tolerance  Stairs            Wheelchair Mobility    Modified Rankin (Stroke Patients Only)       Balance Overall balance assessment: Mild deficits observed, not formally tested                                           Pertinent Vitals/Pain Pain Assessment: No/denies pain    Home Living Family/patient expects to be discharged to:: Private residence Living Arrangements: Alone Available Help at Discharge: Family(daugther lives close and checks in) Type of Home: House Home Access: Stairs to enter Entrance Stairs-Rails: None Entrance Stairs-Number of Steps: 2 Home Layout: Multi-level;Able to live on main level with bedroom/bathroom   Additional Comments: Need to get more home equipment info    Prior Function Level of Independence: Independent         Comments: 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; Has been dealing with chronic pain for years     Hand Dominance        Extremity/Trunk Assessment   Upper Extremity Assessment Upper Extremity Assessment: Overall WFL for tasks assessed(for simple tasks)    Lower Extremity Assessment Lower Extremity Assessment: Generalized weakness       Communication   Communication: No difficulties  Cognition Arousal/Alertness: Awake/alert Behavior During Therapy: WFL for tasks assessed/performed Overall Cognitive Status: Within Functional Limits for tasks assessed  General Comments      Exercises     Assessment/Plan    PT Assessment Patient needs continued PT services  PT Problem List Decreased strength;Decreased activity tolerance;Decreased balance;Pain;Decreased mobility;Decreased knowledge of use of DME       PT Treatment Interventions DME instruction;Gait training;Stair training;Functional mobility training;Therapeutic activities;Therapeutic  exercise;Balance training;Patient/family education    PT Goals (Current goals can be found in the Care Plan section)  Acute Rehab PT Goals Patient Stated Goal: did notstate PT Goal Formulation: With patient Time For Goal Achievement: 04/17/18 Potential to Achieve Goals: Good    Frequency Min 3X/week   Barriers to discharge Other (comment) Daughter expressed concerns to me re: his ability to manage at home by himself, especially managing with a PICC line    Co-evaluation               AM-PAC PT "6 Clicks" Mobility  Outcome Measure Help needed turning from your back to your side while in a flat bed without using bedrails?: None Help needed moving from lying on your back to sitting on the side of a flat bed without using bedrails?: None Help needed moving to and from a bed to a chair (including a wheelchair)?: A Little Help needed standing up from a chair using your arms (e.g., wheelchair or bedside chair)?: None Help needed to walk in hospital room?: None Help needed climbing 3-5 steps with a railing? : A Little 6 Click Score: 22    End of Session Equipment Utilized During Treatment: Gait belt Activity Tolerance: Patient tolerated treatment well Patient left: in chair;with call bell/phone within reach Nurse Communication: Mobility status PT Visit Diagnosis: Unsteadiness on feet (R26.81);Other abnormalities of gait and mobility (R26.89)    Time: 1210-1230 PT Time Calculation (min) (ACUTE ONLY): 20 min   Charges:   PT Evaluation $PT Eval Low Complexity: [place], PT  Acute Rehabilitation Services Pager [phone] Office [place] 04/03/2018, 4:28 PM

## 2018-04-03 NOTE — Progress Notes (Signed)
Peripherally Inserted Central Catheter/Midline Placement  The IV Nurse has discussed with the patient and/or persons authorized to consent for the patient, the purpose of this procedure and the potential benefits and risks involved with this procedure.  The benefits include less needle sticks, lab draws from the catheter, and the patient may be discharged home with the catheter. Risks include, but not limited to, infection, bleeding, blood clot (thrombus formation), and puncture of an artery; nerve damage and irregular heartbeat and possibility to perform a PICC exchange if needed/ordered by physician.  Alternatives to this procedure were also discussed.  Bard Power PICC patient education guide, fact sheet on infection prevention and patient information card has been provided to patient /or left at bedside.    PICC/Midline Placement Documentation  PICC Single Lumen 04/03/18 PICC Right Cephalic 38 cm 0 cm (Active)  Indication for Insertion or Continuance of Line Prolonged intravenous therapies 04/03/2018  7:07 PM  Exposed Catheter (cm) 0 cm 04/03/2018  7:07 PM  Site Assessment Clean;Dry;Intact 04/03/2018  7:07 PM  Line Status Flushed;Saline locked;Blood return noted 04/03/2018  7:07 PM  Dressing Type Transparent 04/03/2018  7:07 PM  Dressing Status Clean;Dry;Intact;Antimicrobial disc in place 04/03/2018  7:07 PM  Line Care Connections checked and tightened 04/03/2018  7:07 PM  Line Adjustment (NICU/IV Team Only) No 04/03/2018  7:07 PM  Dressing Intervention New dressing 04/03/2018  7:07 PM  Dressing Change Due 04/10/18 04/03/2018  7:07 PM       Rolena Infante 04/03/2018, 7:07 PM

## 2018-04-03 NOTE — Progress Notes (Signed)
[facility]OSS [facility] Subjective: Since I last evaluated the patient, patient seems to have some worsening abdominal pain after he has been eating fried foods and buys from [employer].  He was supposed to be discharged home with a PICC line but that was changed due to the change in his overall status.  He has had some nausea but with but no vomiting.  He denies any melena or hematochezia..  Objective: Vital signs in last 24 hours: Temp:  [99.6 F (37.6 C)-100.1 F (37.8 C)] 99.6 F (37.6 C) (03/15 0515) Pulse Rate:  [88-89] 89 (03/15 0515) Resp:  [17-21] 21 (03/15 0515) BP: (146-171)/(77-80) 171/78 (03/15 0515) SpO2:  [96 %] 96 % (03/15 0515) Last BM Date: 04/02/18  Intake/Output from previous day: 03/14 0701 - 03/15 0700 In: 1481.2 [P.O.:900; I.V.:290.4; IV Piggyback:290.7] Out: 2700 [Urine:2700] Intake/Output this shift: Total I/O In: -  Out: 400 [Urine:400]  General appearance: alert, cooperative and mild distress Resp: clear to auscultation bilaterally Cardio: regular rate and rhythm, S1, S2 normal, no murmur, click, rub or gallop GI: soft with epigastric tenderness on palpation with no gaurding; bowel sounds normal; no masses,  no organomegaly Extremities: extremities normal, atraumatic, no cyanosis or edema  Lab Results: Recent Labs    04/01/18 0754  WBC 11.4*  HGB 9.6*  HCT 31.1*  PLT PLATELET CLUMPS NOTED ON SMEAR, UNABLE TO ESTIMATE   BMET Recent Labs    04/01/18 0406 04/02/18 0326 04/03/18 0412  NA 140 139 139  K 3.8 3.4* 3.6  CL 107 110 111  CO2 23 21* 21*  GLUCOSE 131* 146* 197*  BUN 37* 41* 32*  CREATININE 2.15* 2.27* 1.88*  CALCIUM 8.0* 7.5* 7.7*   LFT Recent Labs    04/02/18 0326  PROT 5.6*  ALBUMIN 2.3*  AST 49*  ALT 22  ALKPHOS 47  BILITOT 0.6   Studies/Results: Dg Ercp Biliary & Pancreatic Ducts  Result Date: 04/01/2018 CLINICAL DATA:  78 year old male with a history of cholelithiasis EXAM: ERCP TECHNIQUE: Multiple spot images  obtained with the fluoroscopic device and submitted for interpretation post-procedure. FLUOROSCOPY TIME:  Fluoroscopy Time:  2 minutes 43 seconds COMPARISON:  MR 03/31/2018 FINDINGS: Limited intraoperative images of ERCP. Initial image demonstrates endoscope projecting over the upper abdomen none, with safety wire in position of partial opacification of the extrahepatic biliary ducts. Subsequently there is retrieval balloon deployed and then placement of plastic biliary stent. IMPRESSION: Limited images during ERCP demonstrates partial opacification of the extrahepatic biliary ducts, deployment of a retrieval balloon, and placement of plastic biliary stent Please refer to the dictated operative report for full details of intraoperative findings and procedure. Electronically Signed   By: Corrie Mckusick D.O.   On: 04/01/2018 12:39   [place] Ekg Site Rite  Result Date: 04/02/2018 If Site Rite image not attached, placement could not be confirmed due to current cardiac rhythm.  Medications: I have reviewed the patient's current medications.  Assessment/Plan: 1) Acute biliary pancreatitis complicated by ascending cholangitis and gram-negative bacteremia on IV Meropenem.  CT scan and MRCP showed acute pancreatitis with dilation of the common bile duct at the ampulla and ERCP was done with this antrectomy and stent placement.  Patient is awaiting a laparoscopic cholecystectomy once his acute cholangitis is treated.  Patient is strongly been advised to avoid eating food from outside the hospital. As per my discussion with Dr. Aileen Fass there is not much to add from a GI standpoint therefore we will sign off please call as needed.  2) Esophageal candidiasis on Diflucan. 3) Acute kidney injury injury on chronic kidney disease stage III. 4) History of esophagitis on PPI's.  LOS: 3 days   Tiarra Anastacio 04/03/2018, 8:22 AM

## 2018-04-03 NOTE — Progress Notes (Addendum)
[facility] PROGRESS NOTE    Progress Note  [name]  QHU:[id number] DOB: 1940-02-16 DOA: 03/30/2018 PCP: [name]     Brief Narrative:   [name] is an 78 y.o. male past medical history of CAD, bladder cancer essential hypertension, esophageal stenosis status post dilation who presents with complaint of epigastric pain after eating was found to have biliary pancreatitis  Assessment/Plan:   Sepsis ([place]) to ascending cholangitis/gram-negative bacteremia: Has remained afebrile Blood cultures were positive for ESBL E. coli, change antibiotics to IV meropenem started is 04/02/2018. GI was consulted recommended an ERCP on 04/01/2018 show stricture and sludge no stones, stent placed  Biopsies were done and results are pending.  Acute biliary pancreatitis: CT scan and MRCP showed acute pancreatitis dilation of the common bile duct near the ampulla. ERCP performed with sphincterotomy and stent placement. Consulted GI su=rgery for lap choli.[name] PICC line inserted on 3.14.2020 and will need 2 weeks of IV Merrem meropenem.  As his blood cultures grew ESBL. Surgery was consulted.  Esophageal candidiasis: Cont. IV Diflucan.  Acute kidney injury on  CKD (chronic kidney disease), stage III ([place]): With a baseline creatinine around 1.3-1.6.   In  the setting of sepsis, NSAID use and IV contrast (03/30/2018) and possibly IV vancomycin.  Vancomycin has been discontinued. Creatinine today is improving 1.8, KVO IV fluids recheck basic metabolic panel in the morning.  HyPerkalemia: Hypokalemic will replete orally.  Essential hypertension: Continue to hold Lasix. Continue Bystolic, blood pressure mildly elevated will continue to monitor.  History of esophagitis: Protonix oral.  Dyslipidemia: Hold simvastatin and Zetia.  Pre-op evaluation: 2D echo last year showed an EF of 60% with grade 2 diastolic heart failure. His Peri-operative Gupta score is  0.29% for cardiac complications, he cannot perform 4 metabolic equivalents. Cardiology preop evaluation.   DVT prophylaxis: lovenox Family Communication:daughter Disposition Plan/Barrier to D/C: unable to determine Code Status:     Code Status Orders  (From admission, onward)         Start     Ordered   03/30/18 2350  Full code  Continuous     03/30/18 2353        Code Status History    Date Active Date Inactive Code Status Order ID Comments User Context   11/24/2016 2337 11/25/2016 1700 Full Code [id number]  Avelina Laine, PA-C Inpatient        IV Access:    Peripheral IV   Procedures and diagnostic studies:   Dg Ercp Biliary & Pancreatic Ducts  Result Date: 04/01/2018 CLINICAL DATA:  78 year old male with a history of cholelithiasis EXAM: ERCP TECHNIQUE: Multiple spot images obtained with the fluoroscopic device and submitted for interpretation post-procedure. FLUOROSCOPY TIME:  Fluoroscopy Time:  2 minutes 43 seconds COMPARISON:  MR 03/31/2018 FINDINGS: Limited intraoperative images of ERCP. Initial image demonstrates endoscope projecting over the upper abdomen none, with safety wire in position of partial opacification of the extrahepatic biliary ducts. Subsequently there is retrieval balloon deployed and then placement of plastic biliary stent. IMPRESSION: Limited images during ERCP demonstrates partial opacification of the extrahepatic biliary ducts, deployment of a retrieval balloon, and placement of plastic biliary stent Please refer to the dictated operative report for full details of intraoperative findings and procedure. Electronically Signed   By: Corrie Mckusick D.O.   On: 04/01/2018 12:39   [place] Ekg Site Rite  Result Date: 04/02/2018 If Site Rite image not attached, placement could not be confirmed due to current  cardiac rhythm.    Medical Consultants:    None.  Anti-Infectives:   IV meropenem  Subjective:    [name] tolerating his diet no  new complaints.  Objective:    Vitals:   04/02/18 0522 04/02/18 1442 04/02/18 2244 04/03/18 0515  BP: (!) 157/78 (!) 160/77 (!) 146/80 (!) 171/78  Pulse: 93 88 89 89  Resp: 16  17 (!) 21  Temp: 98.6 F (37 C) 100.1 F (37.8 C) 99.9 F (37.7 C) 99.6 F (37.6 C)  TempSrc: Oral Oral Oral Oral  SpO2: 95% 96% 96% 96%  Weight:      Height:        Intake/Output Summary (Last 24 hours) at 04/03/2018 0937 Last data filed at 04/03/2018 0752 Gross per 24 hour  Intake 1481.16 ml  Output 3100 ml  Net -1618.84 ml   Filed Weights   03/31/18 0000 03/31/18 1531  Weight: 84.4 kg 84 kg    Exam: General exam: In no acute distress. Respiratory system: Good air movement and clear to auscultation. Cardiovascular system: S1 & S2 heard, RRR.  Gastrointestinal system: Abdomen is nondistended, soft and nontender.  Central nervous system: Alert and oriented. No focal neurological deficits. Extremities: No pedal edema. Skin: No rashes, lesions or ulcers Psychiatry: Judgement and insight appear normal. Mood & affect appropriate.    Data Reviewed:    Labs: Basic Metabolic Panel: Recent Labs  Lab 03/30/18 1616 03/31/18 0508 04/01/18 0406 04/02/18 0326 04/03/18 0412  NA 139 144 140 139 139  K 4.2 5.4* 3.8 3.4* 3.6  CL 106 110 107 110 111  CO2 22 26 23  21* 21*  GLUCOSE 182* 124* 131* 146* 197*  BUN 20 29* 37* 41* 32*  CREATININE 1.59* 1.93* 2.15* 2.27* 1.88*  CALCIUM 8.9 8.2* 8.0* 7.5* 7.7*   GFR Estimated Creatinine Clearance: 34 mL/min (A) (by C-G formula based on SCr of 1.88 mg/dL (H)). Liver Function Tests: Recent Labs  Lab 03/30/18 1616 03/31/18 0508 04/01/18 0406 04/02/18 0326  AST 70* 43* 58* 49*  ALT 32 26 24 22   ALKPHOS 90 65 61 47  BILITOT 1.5* 2.1* 1.1 0.6  PROT 7.6 6.1* 6.1* 5.6*  ALBUMIN 3.8 2.9* 2.7* 2.3*   Recent Labs  Lab 03/30/18 1616 03/31/18 0508  LIPASE 390* 1,793*   No results for input(s): AMMONIA in the last 168 hours. Coagulation profile No  results for input(s): INR, PROTIME in the last 168 hours.  CBC: Recent Labs  Lab 03/30/18 1616 03/31/18 0508 04/01/18 0754  WBC 21.4* 12.6* 11.4*  NEUTROABS  --  11.3* 10.1*  HGB 13.2 11.7* 9.6*  HCT 41.5 36.0* 31.1*  MCV 87.7 87.4 89.1  PLT 186 PLATELET CLUMPS NOTED ON SMEAR, UNABLE TO ESTIMATE PLATELET CLUMPS NOTED ON SMEAR, UNABLE TO ESTIMATE   Cardiac Enzymes: No results for input(s): CKTOTAL, CKMB, CKMBINDEX, TROPONINI in the last 168 hours. BNP (last 3 results) No results for input(s): PROBNP in the last 8760 hours. CBG: Recent Labs  Lab 04/02/18 1604 04/02/18 2241 04/03/18 0024 04/03/18 0526 04/03/18 0812  GLUCAP 136* 121* 132* 165* 131*   D-Dimer: No results for input(s): DDIMER in the last 72 hours. Hgb A1c: No results for input(s): HGBA1C in the last 72 hours. Lipid Profile: No results for input(s): CHOL, HDL, LDLCALC, TRIG, CHOLHDL, LDLDIRECT in the last 72 hours. Thyroid function studies: No results for input(s): TSH, T4TOTAL, T3FREE, THYROIDAB in the last 72 hours.  Invalid input(s): FREET3 Anemia work up: No results for input(s):  VITAMINB12, FOLATE, FERRITIN, TIBC, IRON, RETICCTPCT in the last 72 hours. Sepsis Labs: Recent Labs  Lab 03/30/18 1616 03/30/18 2212 03/31/18 0109 03/31/18 0508 04/01/18 0754 04/02/18 0326  WBC 21.4*  --   --  12.6* 11.4*  --   LATICACIDVEN  --  2.4* 1.7 2.2*  --  1.3   Microbiology Recent Results (from the past 240 hour(s))  Culture, blood (routine x 2)     Status: Abnormal   Collection Time: 03/30/18 10:12 PM  Result Value Ref Range Status   Specimen Description BLOOD RIGHT FOREARM  Final   Special Requests   Final    BOTTLES DRAWN AEROBIC AND ANAEROBIC Blood Culture adequate volume   Culture  Setup Time   Final    GRAM NEGATIVE RODS CRITICAL RESULT CALLED TO, READ BACK BY AND VERIFIED WITH: PHRMD M TURNER @1311  03/31/2018 BY S GEZAHEGN Performed at [facility], [address] [place], [place] [address]     Culture (A)  Final    ESCHERICHIA COLI Confirmed Extended Spectrum Beta-Lactamase Producer (ESBL).  In bloodstream infections from ESBL organisms, carbapenems are preferred over piperacillin/tazobactam. They are shown to have a lower risk of mortality.    Report Status 04/02/2018 FINAL  Final   Organism ID, Bacteria ESCHERICHIA COLI  Final      Susceptibility   Escherichia coli - MIC*    AMPICILLIN >=32 RESISTANT Resistant     CEFAZOLIN >=64 RESISTANT Resistant     CEFEPIME RESISTANT Resistant     CEFTAZIDIME RESISTANT Resistant     CEFTRIAXONE >=64 RESISTANT Resistant     CIPROFLOXACIN >=4 RESISTANT Resistant     GENTAMICIN <=1 SENSITIVE Sensitive     IMIPENEM <=0.25 SENSITIVE Sensitive     TRIMETH/SULFA <=20 SENSITIVE Sensitive     AMPICILLIN/SULBACTAM >=32 RESISTANT Resistant     PIP/TAZO 8 SENSITIVE Sensitive     Extended ESBL POSITIVE Resistant     * ESCHERICHIA COLI  Culture, blood (routine x 2)     Status: Abnormal   Collection Time: 03/30/18 10:12 PM  Result Value Ref Range Status   Specimen Description BLOOD RIGHT HAND  Final   Special Requests   Final    BOTTLES DRAWN AEROBIC AND ANAEROBIC Blood Culture adequate volume   Culture  Setup Time   Final    GRAM NEGATIVE RODS AEROBIC BOTTLE ONLY CRITICAL VALUE NOTED.  VALUE IS CONSISTENT WITH PREVIOUSLY REPORTED AND CALLED VALUE. Performed at [facility], [place] [address] [place], [place] [address]    Culture ESCHERICHIA COLI (A)  Final   Report Status 04/02/2018 FINAL  Final  Blood Culture ID Panel (Reflexed)     Status: Abnormal   Collection Time: 03/30/18 10:12 PM  Result Value Ref Range Status   Enterococcus species NOT DETECTED NOT DETECTED Final   Listeria monocytogenes NOT DETECTED NOT DETECTED Final   Staphylococcus species NOT DETECTED NOT DETECTED Final   Staphylococcus aureus (BCID) NOT DETECTED NOT DETECTED Final   Streptococcus species NOT DETECTED NOT DETECTED Final   Streptococcus agalactiae NOT  DETECTED NOT DETECTED Final   Streptococcus pneumoniae NOT DETECTED NOT DETECTED Final   Streptococcus pyogenes NOT DETECTED NOT DETECTED Final   Acinetobacter baumannii NOT DETECTED NOT DETECTED Final   Enterobacteriaceae species DETECTED (A) NOT DETECTED Final    Comment: Enterobacteriaceae represent a large family of gram-negative bacteria, not a single organism. CRITICAL RESULT CALLED TO, READ BACK BY AND VERIFIED WITH: PHRMD M TURNER @1311  03/31/2018 BY S GEZAHEGN  Enterobacter cloacae complex NOT DETECTED NOT DETECTED Final   Escherichia coli DETECTED (A) NOT DETECTED Final    Comment: CRITICAL RESULT CALLED TO, READ BACK BY AND VERIFIED WITH: PHRMD M TURNER @1311  03/31/2018 BY S GEZAHEGN    Klebsiella oxytoca NOT DETECTED NOT DETECTED Final   Klebsiella pneumoniae NOT DETECTED NOT DETECTED Final   Proteus species NOT DETECTED NOT DETECTED Final   Serratia marcescens NOT DETECTED NOT DETECTED Final   Carbapenem resistance NOT DETECTED NOT DETECTED Final   Haemophilus influenzae NOT DETECTED NOT DETECTED Final   Neisseria meningitidis NOT DETECTED NOT DETECTED Final   Pseudomonas aeruginosa NOT DETECTED NOT DETECTED Final   Candida albicans NOT DETECTED NOT DETECTED Final   Candida glabrata NOT DETECTED NOT DETECTED Final   Candida krusei NOT DETECTED NOT DETECTED Final   Candida parapsilosis NOT DETECTED NOT DETECTED Final   Candida tropicalis NOT DETECTED NOT DETECTED Final    Comment: Performed at [facility], [place] [address], [place], [place] [address]     Medications:   . enoxaparin (LOVENOX) injection  40 mg Subcutaneous Q24H  . folic acid  267 mcg Oral Daily  . isosorbide mononitrate  60 mg Oral Daily  . nebivolol  10 mg Oral Daily  . pantoprazole  40 mg Oral Daily  . potassium chloride  40 mEq Oral Daily   Continuous Infusions: . sodium chloride 100 mL/hr at 04/02/18 2251  . fluconazole (DIFLUCAN) IV 100 mg (04/02/18 1409)  . meropenem (MERREM) IV 1 g  (04/02/18 2254)     LOS: 3 days   Charlynne Cousins  [facility]  04/03/2018, 9:37 AM

## 2018-04-03 NOTE — Consult Note (Addendum)
Cardiology Consultation:   Patient ID: [name]; [id number]; October 04, 1940   Admit date: 03/30/2018 Date of Consult: 04/03/2018  Primary Care Provider: Isaac Bliss, Rayford Halsted, MD Primary Cardiologist: Dr. [name], MD  Patient Profile:   [name] is a 78 y.o. male with a hx of CAD with CABG revascularization surgery in 2001 with a LIMA to his LAD, vein to the marginal, vein to the RCA, GERD, mild emphysema and OSA who is being seen today for preoperative evaluation at the request of Dr. Venetia Constable.  History of Present Illness:   [name] is a 78 year old male with a history as stated above who presented to [facility] on 03/31/2018 with complaints of severe upper abdominal pain with nausea and chills for the last week prior to presentation. He reported associated nausea but no vomiting and reports symptoms are worse with eating. He denies chest pain, palpitations, orthopnea symptoms, lower extremity swelling or shortness of breath.  In the ED, he was found to be afebrile and was saturating well on room air.  EKG with no acute ischemic features.  His creatinine was found to be moderately elevated at 1.59.  He was found to have leukocytosis at 21,400 as well as an elevated lactic acid at 2.4.  His troponin level was normal. Blood cultures were collected and he was started on IV fluid hydration as well as empiric antibiotics.  CT of abdomen/pelvis with acute uncomplicated pancreatitis and CBD dilation with opacification of CBD near the ampulla. GI team as well as surgical team was consulted given the above. CT scan and MRCP showed acute pancreatitis with a 50 mm dilation of the common bile duct near the ampulla. Surgery was consulted for possible cholecystectomy.    Of note [name] has a known history of CAD who underwent CABG revascularization in 2001 with LIMA to his LAD, SVG to marginal and SVG to RCA.  Catheterization in 2014 showed patent grafts.  An echocardiogram 08/2010  revealed an LVEF of 40 to 45% with moderate septal hypokinesis and grade 1 DD as well as aortic valve sclerosis.  Went a 5-year follow-up nuclear perfusion study 11/2014 which showed an LVEF of 54% with normal perfusion.  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 He underwent an echocardiogram 10/28/2016 which showed a normal LV function with grade 2 diastolic dysfunction as well as aortic sclerosis without stenosis, mild LA dilation and trivial PR.   He was seen by Dr. Claiborne Billings preoperative assessment after sustaining a left hand injury with a cutting saw which required surgery by Dr. Amedeo Plenty.  At that time, he began to notice a significant decline in his physical ability without significant SOB.  He underwent a Lexiscan Myoview study 01/04/2018 to assess for potential ischemic etiologies given his decline which showed a low risk nuclear perfusion with an LVEF of 51% and no ischemia. HCTZ 12.5 mg.  He was continued on Zetia and rosuvastatin for hyperlipidemia.  LDL at that time was 45.    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 He denies chest pain or other anginal symptoms. He denies palpitations, LE swelling, orthopnea symptoms, dizziness or syncope. He has SOB with exertion but this has had no change over the course of several years.    Past Medical History:  Diagnosis Date  Allergy    B12 deficiency anemia    Blood transfusion without reported diagnosis    CAD (coronary artery disease)    Colon polyps    COPD (chronic obstructive pulmonary disease) (HCC)    Depression    Esophagus, Barrett's    GERD (gastroesophageal reflux disease)    History of bladder cancer    Bladder cancer "8 times"   Hyperlipidemia    Hypertension    Localized osteoarthrosis, lower leg    Restless  leg syndrome    Sleep apnea    Stenosis of esophagus     Past Surgical History:  Procedure Laterality Date   BACK SURGERY     BILIARY BRUSHING  04/01/2018   Procedure: BILIARY BRUSHING;  Surgeon: Irving Copas., MD;  Location: [place];  Service: Gastroenterology;;   BILIARY STENT PLACEMENT  04/01/2018   Procedure: BILIARY STENT PLACEMENT;  Surgeon: Irving Copas., MD;  Location: [place];  Service: Gastroenterology;;   BIOPSY  04/01/2018   Procedure: BIOPSY;  Surgeon: Irving Copas., MD;  Location: [facility] ENDOSCOPY;  Service: Gastroenterology;;   bladder cancer     CERVICAL DISCECTOMY     ACDF   COLONOSCOPY  11/17/2005   normal    CORONARY ARTERY BYPASS GRAFT     x4   ENDOSCOPIC RETROGRADE CHOLANGIOPANCREATOGRAPHY (ERCP) WITH PROPOFOL N/A 04/01/2018   Procedure: ENDOSCOPIC RETROGRADE CHOLANGIOPANCREATOGRAPHY (ERCP) WITH PROPOFOL;  Surgeon: Irving Copas., MD;  Location: [place];  Service: Gastroenterology;  Laterality: N/A;   ESOPHAGOGASTRODUODENOSCOPY  04/29/2010   ESOPHAGOGASTRODUODENOSCOPY (EGD) WITH PROPOFOL N/A 04/01/2018   Procedure: ESOPHAGOGASTRODUODENOSCOPY (EGD) WITH PROPOFOL;  Surgeon: Rush Landmark Telford Nab., MD;  Location: [place];  Service: Gastroenterology;  Laterality: N/A;   EUS  04/01/2018   Procedure: FULL UPPER ENDOSCOPIC ULTRASOUND (EUS) RADIAL;  Surgeon: Irving Copas., MD;  Location: [facility] ENDOSCOPY;  Service: Gastroenterology;;   I&D EXTREMITY Left 11/24/2016   Procedure: IRRIGATION AND DEBRIDEMENT LEFT HAND, THUMB, INDEX, MIDDLE, RING, AND SMALL FINGERS WITH RECONSTRUCTION;  Surgeon: Roseanne Kaufman, MD;  Location: [place];  Service: Orthopedics;  Laterality: Left;   KNEE ARTHROSCOPY     LUMBAR LAMINECTOMY     and fusion   NASAL SINUS SURGERY     POPLITEAL SYNOVIAL CYST EXCISION     REMOVAL OF STONES  04/01/2018   Procedure: REMOVAL OF STONES;  Surgeon: Rush Landmark Telford Nab., MD;   Location: [place];  Service: Gastroenterology;;   Joan Mayans  04/01/2018   Procedure: Joan Mayans;  Surgeon: Mansouraty, Telford Nab., MD;  Location: [place];  Service: Gastroenterology;;     Prior to Admission medications   Medication Sig Start Date End Date Taking? Authorizing Provider  acetaminophen (TYLENOL) 325 MG tablet Take 650 mg by mouth every 6 (six) hours as needed for mild pain.   Yes [provider]  aspirin 81 MG tablet Take 81 mg by mouth daily.     Yes [provider]  BYSTOLIC 10 MG tablet TAKE ONE TABLET BY MOUTH ONCE DAILY Patient taking differently: Take 10 mg by mouth daily.  03/29/17  Yes Troy Sine, MD  cyanocobalamin (,VITAMIN B-12,) 1000 MCG/ML injection INJECT 1 ML (CC) INTRAMUSCULARLY FOR 1 DOSE Patient taking differently: Inject 1,000 mcg into the muscle once.  12/24/17  Yes Isaac Bliss, Rayford Halsted, MD  dicyclomine (BENTYL) 10 MG capsule Take 1 capsule (10 mg total) by mouth every 8 (eight) hours as needed for spasms. 01/07/18  Yes Armbruster, Carlota Raspberry, MD  etodolac (LODINE) 300 MG capsule  TAKE 1 CAPSULE BY MOUTH EVERY 8 HOURS Patient taking differently: Take 300 mg by mouth every 8 (eight) hours.  03/10/18  Yes Isaac Bliss, Rayford Halsted, MD  ezetimibe (ZETIA) 10 MG tablet TAKE 1 TABLET BY MOUTH ONCE DAILY Patient taking differently: Take 10 mg by mouth daily.  09/07/17  Yes Marletta Lor, MD  folic acid (FOLVITE) 102 MCG tablet Take 400 mcg by mouth daily.     Yes [provider]  gabapentin (NEURONTIN) 600 MG tablet Take 1 tablet (600 mg total) by mouth 3 (three) times daily. 11/13/15  Yes Jamse Arn, MD  hydrochlorothiazide (MICROZIDE) 12.5 MG capsule Take 1 capsule (12.5 mg total) by mouth daily. 12/13/17 03/30/18 Yes Troy Sine, MD  isosorbide mononitrate (IMDUR) 60 MG 24 hr tablet TAKE 1 TABLET BY MOUTH ONCE DAILY Patient taking differently: Take 60 mg by mouth daily.  07/14/17  Yes Troy Sine, MD  nitroGLYCERIN (NITROSTAT) 0.4 MG SL tablet Place 1 tablet (0.4 mg total) under the tongue every 5 (five) minutes as needed for chest pain. 01/04/18  Yes Isaac Bliss, Rayford Halsted, MD  omeprazole (PRILOSEC) 40 MG capsule Take 1 capsule (40 mg total) by mouth daily. 12/02/17  Yes Armbruster, Carlota Raspberry, MD  oxyCODONE-acetaminophen (PERCOCET) 10-325 MG tablet Take 1 tablet by mouth 2 (two) times daily as needed. 12/24/17  Yes Isaac Bliss, Rayford Halsted, MD  Probiotic Product (PROBIOTIC ADVANCED PO) Take 1 tablet by mouth daily.   Yes [provider]  rOPINIRole (REQUIP) 2 MG tablet TAKE 1 TABLET BY MOUTH AT BEDTIME Patient taking differently: Take 2 mg by mouth at bedtime.  10/18/17  Yes Dorena Cookey, MD  rosuvastatin (CRESTOR) 20 MG tablet TAKE 1 TABLET BY MOUTH ONCE DAILY Patient taking differently: Take 20 mg by mouth daily.  02/16/18  Yes Troy Sine, MD  venlafaxine (EFFEXOR) 75 MG tablet Take 1 tablet (75 mg total) by mouth daily. 12/31/17  Yes Erline Hau, MD  NEEDLE, DISP, 25 G (B-D DISP NEEDLE 25GX1") 25G X 1" MISC Inject 1000 mcg into muscle once a month. 12/24/17   Isaac Bliss, Rayford Halsted, MD    Inpatient Medications: Scheduled Meds:  enoxaparin (LOVENOX) injection  40 mg Subcutaneous H85I   folic acid  778 mcg Oral Daily   hydrochlorothiazide  12.5 mg Oral Daily   isosorbide mononitrate  60 mg Oral Daily   nebivolol  10 mg Oral Daily   pantoprazole  40 mg Oral Daily   potassium chloride  40 mEq Oral Daily   rOPINIRole  2 mg Oral QHS   rosuvastatin  20 mg Oral QPM   Continuous Infusions:  fluconazole (DIFLUCAN) IV 100 mg (04/02/18 1409)   meropenem (MERREM) IV     PRN Meds: acetaminophen **OR** acetaminophen, albuterol, fentaNYL (SUBLIMAZE) injection, hydrALAZINE, ondansetron **OR** ondansetron (ZOFRAN) IV, phenol  Allergies:    Allergies  Allergen Reactions   Losartan Potassium Other (See Comments)    Hyperkalemia   Tape  Rash    Itching, reddened skin Itching, reddened skin    Social History:   Social History   Socioeconomic History   Marital status: Married    Spouse name: Not on file   Number of children: Not on file   Years of education: Not on file   Highest education level: Not on file  Occupational History   Not on file  Social Needs   Financial resource strain: Not on file   Food insecurity:  Worry: Not on file    Inability: Not on file   Transportation needs:    Medical: Not on file    Non-medical: Not on file  Tobacco Use   Smoking status: Former Smoker    Last attempt to quit: 03/30/1976    Years since quitting: 42.0   Smokeless tobacco: Never Used   Tobacco comment: quit in 1978  Substance and Sexual Activity   Alcohol use: No    Alcohol/week: 0.0 standard drinks   Drug use: No   Sexual activity: Yes  Lifestyle   Physical activity:    Days per week: Not on file    Minutes per session: Not on file   Stress: Not on file  Relationships   Social connections:    Talks on phone: Not on file    Gets together: Not on file    Attends religious service: Not on file    Active member of club or organization: Not on file    Attends meetings of clubs or organizations: Not on file    Relationship status: Not on file   Intimate partner violence:    Fear of current or ex partner: Not on file    Emotionally abused: Not on file    Physically abused: Not on file    Forced sexual activity: Not on file  Other Topics Concern   Not on file  Social History Narrative   Not on file    Family History:   Family History  Problem Relation Age of Onset   Melanoma Mother    Stroke Father    Hypertension Father    Coronary artery disease Other    Colon cancer Neg Hx    Esophageal cancer Neg Hx    Stomach cancer Neg Hx    Rectal cancer Neg Hx    Family Status:  Family Status  Relation Name Status   Mother  Deceased   Father  Deceased   Other  (Not  Specified)   Neg Hx  (Not Specified)    ROS:  Please see the history of present illness.  All other ROS reviewed and negative.     Physical Exam/Data:   Vitals:   04/02/18 0522 04/02/18 1442 04/02/18 2244 04/03/18 0515  BP: (!) 157/78 (!) 160/77 (!) 146/80 (!) 171/78  Pulse: 93 88 89 89  Resp: 16  17 (!) 21  Temp: 98.6 F (37 C) 100.1 F (37.8 C) 99.9 F (37.7 C) 99.6 F (37.6 C)  TempSrc: Oral Oral Oral Oral  SpO2: 95% 96% 96% 96%  Weight:      Height:        Intake/Output Summary (Last 24 hours) at 04/03/2018 1309 Last data filed at 04/03/2018 0958 Gross per 24 hour  Intake 1821.16 ml  Output 2600 ml  Net -778.84 ml   Filed Weights   03/31/18 0000 03/31/18 1531  Weight: 84.4 kg 84 kg   Body mass index is 27.35 kg/m.   General: Well developed, well nourished, NAD Skin: Warm, dry, intact  Head: Normocephalic, atraumatic, sclera non-icteric, no xanthomas, clear, moist mucus membranes. Neck: Negative for carotid bruits. No JVD Lungs:Clear to ausculation bilaterally. No wheezes, rales, or rhonchi. Breathing is unlabored. Cardiovascular: RRR with S1 S2. No murmurs, rubs, gallops, or LV heave appreciated. Abdomen: Soft, tender, non-distended with normoactive bowel sounds.  No obvious abdominal masses. MSK: Strength and tone appear normal for age. 5/5 in all extremities Extremities: No edema. No clubbing or cyanosis. DP/PT pulses 2+ bilaterally Neuro:  Alert and oriented. No focal deficits. No facial asymmetry. MAE spontaneously. Psych: Responds to questions appropriately with normal affect.    EKG:  The EKG was personally reviewed and demonstrates: No new tracing as of 03/31/2018. 03/30/2018 NSR  Telemetry:  Telemetry was personally reviewed and demonstrates:  NSR  Relevant CV Studies:  ECHO: 12/23/2017: Study Conclusions  - Left ventricle: The cavity size was normal. There was mild   concentric hypertrophy. Systolic function was normal. The   estimated ejection  fraction was in the range of 60% to 65%. Wall   motion was normal; there were no regional wall motion   abnormalities. Doppler parameters are consistent with abnormal   left ventricular relaxation (grade 1 diastolic dysfunction). - Aortic valve: Trileaflet; mildly thickened, mildly calcified   leaflets. There was trivial regurgitation. - Mitral valve: There was no significant regurgitation. - Left atrium: The atrium was mildly dilated. - Tricuspid valve: There was no significant regurgitation. - Pulmonic valve: There was no significant regurgitation. - Inferior vena cava: The vessel was normal in size. The   respirophasic diameter changes were in the normal range (= 50%),   consistent with normal central venous pressure.  Impressions:  - Normal LV EF, no significant valve disease.   Stress test: 01/04/2018:  The left ventricular ejection fraction is mildly decreased (45-54%).  Nuclear stress EF: 51%.  This is a low risk study.   Normal resting and stress perfusion. No ischemia or infarction EF Estimated at 51% apex appears hypokinetic RV appears prominent   Laboratory Data:  Chemistry Recent Labs  Lab 04/01/18 0406 04/02/18 0326 04/03/18 0412  NA 140 139 139  K 3.8 3.4* 3.6  CL 107 110 111  CO2 23 21* 21*  GLUCOSE 131* 146* 197*  BUN 37* 41* 32*  CREATININE 2.15* 2.27* 1.88*  CALCIUM 8.0* 7.5* 7.7*  GFRNONAA 29* 27* 34*  GFRAA 34* 32* 40*  ANIONGAP 10 8 7     Total Protein  Date Value Ref Range Status  04/02/2018 5.6 (L) 6.5 - 8.1 g/dL Final  12/24/2017 6.7 6.0 - 8.5 g/dL Final   Albumin  Date Value Ref Range Status  04/02/2018 2.3 (L) 3.5 - 5.0 g/dL Final  12/24/2017 4.1 3.5 - 4.8 g/dL Final   AST  Date Value Ref Range Status  04/02/2018 49 (H) 15 - 41 U/L Final   ALT  Date Value Ref Range Status  04/02/2018 22 0 - 44 U/L Final   Alkaline Phosphatase  Date Value Ref Range Status  04/02/2018 47 38 - 126 U/L Final   Total Bilirubin  Date Value  Ref Range Status  04/02/2018 0.6 0.3 - 1.2 mg/dL Final   Bilirubin Total  Date Value Ref Range Status  12/24/2017 0.5 0.0 - 1.2 mg/dL Final   Hematology Recent Labs  Lab 03/30/18 1616 03/31/18 0508 04/01/18 0754  WBC 21.4* 12.6* 11.4*  RBC 4.73 4.12* 3.49*  HGB 13.2 11.7* 9.6*  HCT 41.5 36.0* 31.1*  MCV 87.7 87.4 89.1  MCH 27.9 28.4 27.5  MCHC 31.8 32.5 30.9  RDW 14.1 14.6 15.1  PLT 186 PLATELET CLUMPS NOTED ON SMEAR, UNABLE TO ESTIMATE PLATELET CLUMPS NOTED ON SMEAR, UNABLE TO ESTIMATE   Cardiac EnzymesNo results for input(s): TROPONINI in the last 168 hours.  Recent Labs  Lab 03/30/18 1629  TROPIPOC 0.05    BNPNo results for input(s): BNP, PROBNP in the last 168 hours.  DDimer No results for input(s): DDIMER in the last 168 hours. TSH:  Lab  Results  Component Value Date   TSH 3.000 12/24/2017   Lipids: Lab Results  Component Value Date   CHOL 80 03/31/2018   HDL 32 (L) 03/31/2018   LDLCALC 34 03/31/2018   LDLDIRECT 90.3 04/17/2008   TRIG 72 03/31/2018   CHOLHDL 2.5 03/31/2018   HgbA1c: Lab Results  Component Value Date   HGBA1C 7.0 (H) 03/31/2018    Radiology/Studies:  Ct Abdomen Pelvis W Contrast  Result Date: 03/30/2018 CLINICAL DATA:  78 y/o  M; generalized abdominal pain and tremors. EXAM: CT ABDOMEN AND PELVIS WITH CONTRAST TECHNIQUE: Multidetector CT imaging of the abdomen and pelvis was performed using the standard protocol following bolus administration of intravenous contrast. CONTRAST:  16mL OMNIPAQUE IOHEXOL 300 MG/ML  SOLN COMPARISON:  01/05/2018 CT abdomen and pelvis FINDINGS: Lower chest: No acute abnormality. Hepatobiliary: No focal liver abnormality. No gallbladder wall thickening or radiopaque gallstone identified within the gallbladder. Increased dilatation of the common bile duct measuring up to 15 mm at the liver hilum. The common bile duct near the ampulla is opacified (series 3, image 33 and series 6, image 66). Pancreas: Edema  surrounding the pancreas compatible with acute pancreatitis. No findings of necrosis or acute peripancreatic collection at this time. No main duct dilatation. Spleen: Normal in size without focal abnormality. Adrenals/Urinary Tract: Adrenal glands are unremarkable. Kidneys are normal, without renal calculi, focal lesion, or hydronephrosis. Bladder is unremarkable. Stomach/Bowel: Stomach is within normal limits. Appendix not identified, no pericecal inflammation. No evidence of bowel wall thickening, distention, or inflammatory changes. Vascular/Lymphatic: Aortic atherosclerosis. No enlarged abdominal or pelvic lymph nodes. Reproductive: Prostate is unremarkable. Other: No abdominal wall hernia or abnormality. No abdominopelvic ascites. Musculoskeletal: No fracture is seen. IMPRESSION: 1. Findings compatible with acute pancreatitis. No necrosis or acute peripancreatic collection identified at this time. 2. Increased dilatation of the common bile duct measuring up to 15 mm at the liver hilum and opacified common bile duct near the ampulla. No main duct dilatation of the pancreas. Findings may represent stricture, obstructing mass, or choledocholithiasis. This can be further evaluated with MRI/MRCP without and with contrast. Electronically Signed   By: Kristine Garbe M.D.   On: 03/30/2018 23:08   [name] At [facility]  Result Date: 03/31/2018 CLINICAL DATA:  Acute pancreatitis. EXAM: MRI ABDOMEN WITHOUT AND WITH CONTRAST (INCLUDING MRCP) TECHNIQUE: Multiplanar multisequence Jacob imaging of the abdomen was performed both before and after the administration of intravenous contrast. Heavily T2-weighted images of the biliary and pancreatic ducts were obtained, and three-dimensional MRCP images were rendered by post processing. CONTRAST:  8.4 cc Gadavist COMPARISON:  CT scan 03/30/2018 FINDINGS: Lower chest: The lung bases are clear of an acute process. The heart is normal in size. No pericardial effusion.  Hepatobiliary: No focal hepatic lesions. Mild stable intrahepatic and extrahepatic biliary dilatation when compared to CT scan from 2019. The gallbladder is distended. No significant gallbladder wall thickening but there is mild pericholecystic fluid. Small gallstones suspected in the gallbladder neck. Intra and extrahepatic biliary dilatation. No common bile duct stones are identified. No ampullary mass or pancreatic head mass. There could be a distal stricture. This appears to be chronic. Pancreas: Mild peripancreatic inflammatory changes with a small amount of fluid in the anterior pararenal spaces. Normal pancreatic enhancement and normal caliber and course of the main pancreatic duct. Findings consistent with mild uncomplicated pancreatitis as seen on the CT scan. Spleen:  Normal size.  No focal lesions. Adrenals/Urinary Tract: The adrenal glands and kidneys are unremarkable.  Stomach/Bowel: The gastric wall in the body and antral regions appears somewhat thickened and edematous. There is also moderate enhancement. Could not exclude peptic ulcer disease/gastritis. The visualized small bowel and colon are unremarkable. Vascular/Lymphatic: No pathologically enlarged lymph nodes identified. No abdominal aortic aneurysm demonstrated. Small scattered nodes, likely inflammatory. Other:  Small amount of free fluid but no overt ascites. Musculoskeletal: No significant findings. IMPRESSION: 1. Jacob findings consistent with mild acute uncomplicated pancreatitis. 2. Mild gallbladder distension and mild pericholecystic fluid but no gallbladder wall thickening or other definite findings for acute cholecystitis. 3. Stable appearing intra and extrahepatic biliary dilatation. Possible distal stricture. No common bile duct stones are identified. Normal main pancreatic duct. 4. Possible inflammatory changes involving the stomach which could suggest peptic ulcer disease or gastritis. Electronically Signed   By: Marijo Sanes M.D.    On: 03/31/2018 08:43   Dg Ercp Biliary & Pancreatic Ducts  Result Date: 04/01/2018 CLINICAL DATA:  78 year old male with a history of cholelithiasis EXAM: ERCP TECHNIQUE: Multiple spot images obtained with the fluoroscopic device and submitted for interpretation post-procedure. FLUOROSCOPY TIME:  Fluoroscopy Time:  2 minutes 43 seconds COMPARISON:  Jacob 03/31/2018 FINDINGS: Limited intraoperative images of ERCP. Initial image demonstrates endoscope projecting over the upper abdomen none, with safety wire in position of partial opacification of the extrahepatic biliary ducts. Subsequently there is retrieval balloon deployed and then placement of plastic biliary stent. IMPRESSION: Limited images during ERCP demonstrates partial opacification of the extrahepatic biliary ducts, deployment of a retrieval balloon, and placement of plastic biliary stent Please refer to the dictated operative report for full details of intraoperative findings and procedure. Electronically Signed   By: Corrie Mckusick D.O.   On: 04/01/2018 12:39   Jacob Abdomen Mrcp Moise Boring Contast  Result Date: 03/31/2018 CLINICAL DATA:  Acute pancreatitis. EXAM: MRI ABDOMEN WITHOUT AND WITH CONTRAST (INCLUDING MRCP) TECHNIQUE: Multiplanar multisequence Jacob imaging of the abdomen was performed both before and after the administration of intravenous contrast. Heavily T2-weighted images of the biliary and pancreatic ducts were obtained, and three-dimensional MRCP images were rendered by post processing. CONTRAST:  8.4 cc Gadavist COMPARISON:  CT scan 03/30/2018 FINDINGS: Lower chest: The lung bases are clear of an acute process. The heart is normal in size. No pericardial effusion. Hepatobiliary: No focal hepatic lesions. Mild stable intrahepatic and extrahepatic biliary dilatation when compared to CT scan from 2019. The gallbladder is distended. No significant gallbladder wall thickening but there is mild pericholecystic fluid. Small gallstones suspected in the  gallbladder neck. Intra and extrahepatic biliary dilatation. No common bile duct stones are identified. No ampullary mass or pancreatic head mass. There could be a distal stricture. This appears to be chronic. Pancreas: Mild peripancreatic inflammatory changes with a small amount of fluid in the anterior pararenal spaces. Normal pancreatic enhancement and normal caliber and course of the main pancreatic duct. Findings consistent with mild uncomplicated pancreatitis as seen on the CT scan. Spleen:  Normal size.  No focal lesions. Adrenals/Urinary Tract: The adrenal glands and kidneys are unremarkable. Stomach/Bowel: The gastric wall in the body and antral regions appears somewhat thickened and edematous. There is also moderate enhancement. Could not exclude peptic ulcer disease/gastritis. The visualized small bowel and colon are unremarkable. Vascular/Lymphatic: No pathologically enlarged lymph nodes identified. No abdominal aortic aneurysm demonstrated. Small scattered nodes, likely inflammatory. Other:  Small amount of free fluid but no overt ascites. Musculoskeletal: No significant findings. IMPRESSION: 1. Jacob findings consistent with mild acute uncomplicated pancreatitis. 2. Mild gallbladder  distension and mild pericholecystic fluid but no gallbladder wall thickening or other definite findings for acute cholecystitis. 3. Stable appearing intra and extrahepatic biliary dilatation. Possible distal stricture. No common bile duct stones are identified. Normal main pancreatic duct. 4. Possible inflammatory changes involving the stomach which could suggest peptic ulcer disease or gastritis. Electronically Signed   By: Marijo Sanes M.D.   On: 03/31/2018 08:43   [place] Ekg Site Rite  Result Date: 04/02/2018 If Site Rite image not attached, placement could not be confirmed due to current cardiac rhythm.  Assessment and Plan:   1.  Preoperative assessment: -He was seen by Dr. Claiborne Billings preoperative assessment after his  sustaining a left hand injury with a cutting saw which required surgery by Dr. Amedeo Plenty.  At that time, he began to notice a significant decline in his ability without significant SOB.  He underwent a Lexiscan Myoview study to assess for potential ischemic etiologies given his recent decline in physical activity. HCTZ 12.5 mg. He was continued on Zetia and rosuvastatin for hyperlipidemia.  -Lexiscan performed 01/04/2018 showed a low risk nuclear perfusion with an LVEF of 51% and no ischemia. -Patient presented with acute abdominal pain found to have acute biliary pancreatitis with dilated common bile duct per ERCP with sphincterotomy and stent stent placement and plans for surgical intervention with elective laparoscopic cholecystectomy -Patient has sepsis with ascending cholangitis and gram-negative bacteremia has been on IV antibiotics with improvement -Revised cardiac risk index risk evaluation indicates a grade 2 risk, indicating a 6.0% 30-day risk of death, MI or cardiac arrest in the setting of postsurgical procedure. [facility] activity status index is calculated at 42.2 points indicating a functional capacity of 7.93 METs -Per above and in the setting of no recent anginal symptoms, he is at higher risk for surgical intervention however is also at risk for not completing surgery given acute infection.  -MD to follow with final recommendations   2.  Sepsis in the setting of cholangitis/gram-negative bacteremia: -CT of abdomen/pelvis with acute uncomplicated pancreatitis and CBD dilation with opacification of CBD near the ampulla. GI team as well as surgical team was consulted given the above. CT scan and MRCP showed acute pancreatitis with a 50 mm dilation of the common bile duct near the ampulla. Surgery was consulted for possible cholecystectomy.   -See #1  3.  Acute biliary pancreatitis: -Followed by internal medicine and surgical teams -Plan for cholecystectomy once cleared from a cardiology  perspective -See #1 above  4.  Essential hypertension: -Elevated, 171/78 -Continue current regimen -Would consider med titration once stable from an infection standpoint   5.  Dyslipidemia: -Stable, continue current regimen -On statin  6.  Acute on chronic kidney disease stage III: -Creatinine, 1.88 today with a baseline of 1.3- 2.2 range -We will continue to monitor with daily labs    For questions or updates, please contact [place] Please consult [email or web address] for contact info under Cardiology/STEMI.   Lyndel Safe NP-C [facility] Pager: [phone] 04/03/2018 1:09 PM  History and all data above reviewed.  Patient examined.  I agree with the findings as above.  The patient has had no recent cardiovascular symptoms.  He reports that he is relatively active.  He grows peaches.  He does have chronic DOE but no recent exacerbation.  He denies PND or orthopnea.  He has had no recent chest pain, neck or arm pain.  He presents with acute biliary pancreatitis and he might need to have cholecystectomy.    The  patient exam reveals COR:RRR  ,  Lungs: Clear  ,  Abd: Positive bowel sounds, no rebound no guarding, Ext Mild bilateral edema  .  All available labs, radiology testing, previous records reviewed. Agree with documented assessment and plan.   Preop:  The patient has a moderate risk.  I had this conversation with the patient and his daughter.  They understand the risk and would be willing to proceed with surgery.  No change in therapy or further testing is indicated.     Jeneen Rinks Kariya Lavergne  8:51 PM  04/03/2018

## 2018-04-04 ENCOUNTER — Encounter: Payer: Self-pay | Admitting: Gastroenterology

## 2018-04-04 ENCOUNTER — Inpatient Hospital Stay (HOSPITAL_COMMUNITY): Payer: Medicare Other

## 2018-04-04 DIAGNOSIS — Z1612 Extended spectrum beta lactamase (ESBL) resistance: Secondary | ICD-10-CM

## 2018-04-04 DIAGNOSIS — A499 Bacterial infection, unspecified: Secondary | ICD-10-CM

## 2018-04-04 LAB — CBC WITH DIFFERENTIAL/PLATELET
Abs Immature Granulocytes: 0.07 10*3/uL (ref 0.00–0.07)
BASOS ABS: 0.1 10*3/uL (ref 0.0–0.1)
Basophils Relative: 0 %
EOS PCT: 4 %
Eosinophils Absolute: 0.4 10*3/uL (ref 0.0–0.5)
HCT: 30.4 % — ABNORMAL LOW (ref 39.0–52.0)
Hemoglobin: 9.7 g/dL — ABNORMAL LOW (ref 13.0–17.0)
Immature Granulocytes: 1 %
Lymphocytes Relative: 10 %
Lymphs Abs: 1.1 10*3/uL (ref 0.7–4.0)
MCH: 28.6 pg (ref 26.0–34.0)
MCHC: 31.9 g/dL (ref 30.0–36.0)
MCV: 89.7 fL (ref 80.0–100.0)
Monocytes Absolute: 1.1 10*3/uL — ABNORMAL HIGH (ref 0.1–1.0)
Monocytes Relative: 9 %
NRBC: 0 % (ref 0.0–0.2)
Neutro Abs: 9 10*3/uL — ABNORMAL HIGH (ref 1.7–7.7)
Neutrophils Relative %: 76 %
Platelets: 130 10*3/uL — ABNORMAL LOW (ref 150–400)
RBC: 3.39 MIL/uL — ABNORMAL LOW (ref 4.22–5.81)
RDW: 15.3 % (ref 11.5–15.5)
WBC: 11.7 10*3/uL — ABNORMAL HIGH (ref 4.0–10.5)

## 2018-04-04 LAB — BASIC METABOLIC PANEL
ANION GAP: 8 (ref 5–15)
Anion gap: 3 — ABNORMAL LOW (ref 5–15)
Anion gap: 10 (ref 5–15)
BUN: 32 mg/dL — ABNORMAL HIGH (ref 8–23)
BUN: 31 mg/dL — ABNORMAL HIGH (ref 8–23)
BUN: 32 mg/dL — ABNORMAL HIGH (ref 8–23)
CO2: 25 mmol/L (ref 22–32)
CO2: 23 mmol/L (ref 22–32)
CO2: 25 mmol/L (ref 22–32)
Calcium: 8 mg/dL — ABNORMAL LOW (ref 8.9–10.3)
Calcium: 8.5 mg/dL — ABNORMAL LOW (ref 8.9–10.3)
Calcium: 8.4 mg/dL — ABNORMAL LOW (ref 8.9–10.3)
Chloride: 112 mmol/L — ABNORMAL HIGH (ref 98–111)
Chloride: 110 mmol/L (ref 98–111)
Chloride: 105 mmol/L (ref 98–111)
Creatinine, Ser: 1.68 mg/dL — ABNORMAL HIGH (ref 0.61–1.24)
Creatinine, Ser: 1.74 mg/dL — ABNORMAL HIGH (ref 0.61–1.24)
Creatinine, Ser: 1.79 mg/dL — ABNORMAL HIGH (ref 0.61–1.24)
GFR calc Af Amer: 45 mL/min — ABNORMAL LOW (ref >60)
GFR calc Af Amer: 43 mL/min — ABNORMAL LOW (ref >60)
GFR calc Af Amer: 42 mL/min — ABNORMAL LOW (ref >60)
GFR calc non Af Amer: 39 mL/min — ABNORMAL LOW (ref >60)
GFR calc non Af Amer: 38 mL/min — ABNORMAL LOW (ref >60)
GFR calc non Af Amer: 36 mL/min — ABNORMAL LOW (ref >60)
Glucose, Bld: 146 mg/dL — ABNORMAL HIGH (ref 70–99)
Glucose, Bld: 207 mg/dL — ABNORMAL HIGH (ref 70–99)
Glucose, Bld: 247 mg/dL — ABNORMAL HIGH (ref 70–99)
POTASSIUM: 4.1 mmol/L (ref 3.5–5.1)
Potassium: 4 mmol/L (ref 3.5–5.1)
Potassium: 3.7 mmol/L (ref 3.5–5.1)
SODIUM: 140 mmol/L (ref 135–145)
Sodium: 141 mmol/L (ref 135–145)
Sodium: 140 mmol/L (ref 135–145)

## 2018-04-04 LAB — GLUCOSE, CAPILLARY
GLUCOSE-CAPILLARY: 133 mg/dL — AB (ref 70–99)
Glucose-Capillary: 129 mg/dL — ABNORMAL HIGH (ref 70–99)
Glucose-Capillary: 134 mg/dL — ABNORMAL HIGH (ref 70–99)
Glucose-Capillary: 159 mg/dL — ABNORMAL HIGH (ref 70–99)
Glucose-Capillary: 174 mg/dL — ABNORMAL HIGH (ref 70–99)
Glucose-Capillary: 231 mg/dL — ABNORMAL HIGH (ref 70–99)

## 2018-04-04 LAB — MAGNESIUM: MAGNESIUM: 1.2 mg/dL — AB (ref 1.7–2.4)

## 2018-04-04 LAB — LACTIC ACID, PLASMA: Lactic Acid, Venous: 0.8 mmol/L (ref 0.5–1.9)

## 2018-04-04 MED ORDER — OXYCODONE HCL 5 MG PO TABS
5.0000 mg | ORAL_TABLET | Freq: Three times a day (TID) | ORAL | Status: DC
  Administered 2018-04-04 – 2018-04-06 (×8): 5 mg via ORAL
  Filled 2018-04-04 (×8): qty 1

## 2018-04-04 MED ORDER — OXYCODONE HCL 5 MG PO TABS: 5.0000 mg | ORAL_TABLET | Freq: Three times a day (TID) | ORAL | Status: DC | PRN

## 2018-04-04 MED ORDER — FUROSEMIDE 10 MG/ML IJ SOLN
40.0000 mg | Freq: Once | INTRAMUSCULAR | Status: AC
  Administered 2018-04-04: 40 mg via INTRAVENOUS
  Filled 2018-04-04: qty 4

## 2018-04-04 MED ORDER — FLUCONAZOLE IN SODIUM CHLORIDE 200-0.9 MG/100ML-% IV SOLN
200.0000 mg | INTRAVENOUS | Status: DC
  Filled 2018-04-04: qty 100

## 2018-04-04 NOTE — Progress Notes (Signed)
PHARMACY NOTE:  ANTIMICROBIAL RENAL DOSAGE ADJUSTMENT  Current antimicrobial regimen includes a mismatch between antimicrobial dosage and estimated renal function.  As per policy approved by the Pharmacy & Therapeutics and Medical Executive Committees, the antimicrobial dosage will be adjusted accordingly.  Current antimicrobial dosage:  Fluconazole 100mg  IV q24h  Indication: Esophageal Candidiasis  Renal Function:  Estimated Creatinine Clearance: 35.7 mL/min (A) (by C-G formula based on SCr of 1.79 mg/dL (H)). []      On intermittent HD, scheduled: []      On CRRT    Antimicrobial dosage has been changed to:  Fluconazole 200mg  IV q24h  Additional comments:   Thank you for allowing pharmacy to be a part of this patient's care.  Norva Riffle, [facility] 04/04/2018 4:50 PM

## 2018-04-04 NOTE — Progress Notes (Signed)
[facility] PROGRESS NOTE    Progress Note  [name]  EQA:[id number] DOB: 1940/08/04 DOA: 03/30/2018 PCP: Isaac Bliss, Rayford Halsted, MD     Brief Narrative:   [name] is an 78 y.o. male past medical history of CAD, bladder cancer essential hypertension, esophageal stenosis status post dilation who presents with complaint of epigastric pain after eating was found to have biliary pancreatitis  Assessment/Plan:   Sepsis ([place]) to ascending cholangitis/gram-negative bacteremia: Has remained afebrile. Blood cultures were positive for ESBL E. coli, change antibiotics to IV meropenem started is 04/02/2018. GI was consulted recommended an ERCP on 04/01/2018 show stricture and sludge no stones, stent placed  Biopsies were done and results are pending. 2 weeks of antibiotics for his ESBL bacteremia.  Acute biliary pancreatitis: CT scan and MRCP showed acute pancreatitis dilation of the common bile duct near the ampulla. ERCP performed with sphincterotomy and stent placement.  Biopsy results are still pending. Consulted GI surgery for lap choli. PICC line inserted on 3.14.2020. Narcotics to his home dose.  Esophageal candidiasis: Cont. IV Diflucan.  Acute kidney injury on  CKD (chronic kidney disease), stage III ([place]): With a baseline creatinine around 1.3-1.6.   In  the setting of sepsis, NSAID use and IV contrast (03/30/2018) and possibly IV vancomycin.  He received IV Lasix overnight basic metabolic panel is pending today. Tolerating his diet keep fluids at [facility].  HyPerkalemia: Hypokalemic will replete orally.  Essential hypertension: Continue to hold Lasix. Continue Bystolic, blood pressure mildly elevated will continue to monitor.  History of esophagitis: Protonix oral.  Dyslipidemia: Hold simvastatin and Zetia.  Pre-op evaluation: 2D echo last year showed an EF of 60% with grade 2 diastolic heart failure. Patient has agreed to proceed with surgical  intervention despite risk and benefits. He was consulted recommended no further testing.   DVT prophylaxis: lovenox Family Communication:daughter Disposition Plan/Barrier to D/C: unable to determine Code Status:     Code Status Orders  (From admission, onward)         Start     Ordered   03/30/18 2350  Full code  Continuous     03/30/18 2353        Code Status History    Date Active Date Inactive Code Status Order ID Comments User Context   11/24/2016 2337 11/25/2016 1700 Full Code [id number]  Avelina Laine, PA-C Inpatient        IV Access:    Peripheral IV   Procedures and diagnostic studies:   Dg Chest Port 1 View  Result Date: 04/04/2018 CLINICAL DATA:  Flash pulmonary edema, bilat lower extremity swelling EXAM: PORTABLE CHEST 1 VIEW COMPARISON:  Chest CT, 04/04/2010.  Chest radiographs, 05/24/2009. FINDINGS: Cardiac silhouette is normal in size. There stable changes from previous CABG surgery. No mediastinal or hilar masses. No convincing adenopathy. Stable scarring in the left mid to lower lung with blunting of the left costophrenic sulci. Lungs otherwise clear. No pleural effusion or pneumothorax. Right PICC has its tip in the lower superior vena cava. Skeletal structures grossly intact. Stable changes from a previous anterior cervical spine fusion. IMPRESSION: 1. No acute cardiopulmonary disease. No radiographic evidence of pulmonary edema. Electronically Signed   By: Lajean Manes M.D.   On: 04/04/2018 01:05     Medical Consultants:    None.  Anti-Infectives:   IV meropenem  Subjective:    [name] tolerating his diet.  Objective:    Vitals:   04/03/18 1722 04/03/18 2031 04/04/18  0124 04/04/18 0405  BP: (!) 153/76 (!) 174/80  (!) 164/89  Pulse: 63 75 84 95  Resp:  20 20 19   Temp: 98.6 F (37 C) 98.4 F (36.9 C)  98.3 F (36.8 C)  TempSrc: Oral Oral  Oral  SpO2: 100% 99% 97% 100%  Weight:      Height:        Intake/Output  Summary (Last 24 hours) at 04/04/2018 1007 Last data filed at 04/04/2018 0600 Gross per 24 hour  Intake 159.24 ml  Output 1510 ml  Net -1350.76 ml   Filed Weights   03/31/18 0000 03/31/18 1531  Weight: 84.4 kg 84 kg    Exam: General exam: In no acute distress. Respiratory system: Good air movement and clear to auscultation. Cardiovascular system: S1 & S2 heard, RRR.  Gastrointestinal system: Abdomen is nondistended, soft and nontender.  Central nervous system: Alert and oriented. No focal neurological deficits. Extremities: No pedal edema. Skin: No rashes, lesions or ulcers Psychiatry: Judgement and insight appear normal. Mood & affect appropriate.    Data Reviewed:    Labs: Basic Metabolic Panel: Recent Labs  Lab 04/01/18 0406 04/02/18 0326 04/03/18 0412 04/04/18 0123 04/04/18 0349  NA 140 139 139 140 141  K 3.8 3.4* 3.6 4.1 4.0  CL 107 110 111 112* 110  CO2 23 21* 21* 25 23  GLUCOSE 131* 146* 197* 146* 207*  BUN 37* 41* 32* 32* 31*  CREATININE 2.15* 2.27* 1.88* 1.68* 1.74*  CALCIUM 8.0* 7.5* 7.7* 8.0* 8.5*  MG  --   --   --  1.2*  --    GFR Estimated Creatinine Clearance: 36.7 mL/min (A) (by C-G formula based on SCr of 1.74 mg/dL (H)). Liver Function Tests: Recent Labs  Lab 03/30/18 1616 03/31/18 0508 04/01/18 0406 04/02/18 0326  AST 70* 43* 58* 49*  ALT 32 26 24 22   ALKPHOS 90 65 61 47  BILITOT 1.5* 2.1* 1.1 0.6  PROT 7.6 6.1* 6.1* 5.6*  ALBUMIN 3.8 2.9* 2.7* 2.3*   Recent Labs  Lab 03/30/18 1616 03/31/18 0508 04/03/18 0412  LIPASE 390* 1,793* 174*   No results for input(s): AMMONIA in the last 168 hours. Coagulation profile No results for input(s): INR, PROTIME in the last 168 hours.  CBC: Recent Labs  Lab 03/30/18 1616 03/31/18 0508 04/01/18 0754 04/04/18 0123  WBC 21.4* 12.6* 11.4* 11.7*  NEUTROABS  --  11.3* 10.1* 9.0*  HGB 13.2 11.7* 9.6* 9.7*  HCT 41.5 36.0* 31.1* 30.4*  MCV 87.7 87.4 89.1 89.7  PLT 186 PLATELET CLUMPS NOTED  ON SMEAR, UNABLE TO ESTIMATE PLATELET CLUMPS NOTED ON SMEAR, UNABLE TO ESTIMATE 130*   Cardiac Enzymes: No results for input(s): CKTOTAL, CKMB, CKMBINDEX, TROPONINI in the last 168 hours. BNP (last 3 results) No results for input(s): PROBNP in the last 8760 hours. CBG: Recent Labs  Lab 04/03/18 1710 04/03/18 2028 04/04/18 0019 04/04/18 0400 04/04/18 0812  GLUCAP 136* 133* 134* 174* 159*   D-Dimer: No results for input(s): DDIMER in the last 72 hours. Hgb A1c: No results for input(s): HGBA1C in the last 72 hours. Lipid Profile: No results for input(s): CHOL, HDL, LDLCALC, TRIG, CHOLHDL, LDLDIRECT in the last 72 hours. Thyroid function studies: No results for input(s): TSH, T4TOTAL, T3FREE, THYROIDAB in the last 72 hours.  Invalid input(s): FREET3 Anemia work up: No results for input(s): VITAMINB12, FOLATE, FERRITIN, TIBC, IRON, RETICCTPCT in the last 72 hours. Sepsis Labs: Recent Labs  Lab 03/30/18 1616  03/31/18 0109 03/31/18  2536 04/01/18 0754 04/02/18 0326 04/04/18 0123  WBC 21.4*  --   --  12.6* 11.4*  --  11.7*  LATICACIDVEN  --    < > 1.7 2.2*  --  1.3 0.8   < > = values in this interval not displayed.   Microbiology Recent Results (from the past 240 hour(s))  Culture, blood (routine x 2)     Status: Abnormal   Collection Time: 03/30/18 10:12 PM  Result Value Ref Range Status   Specimen Description BLOOD RIGHT FOREARM  Final   Special Requests   Final    BOTTLES DRAWN AEROBIC AND ANAEROBIC Blood Culture adequate volume   Culture  Setup Time   Final    GRAM NEGATIVE RODS CRITICAL RESULT CALLED TO, READ BACK BY AND VERIFIED WITH: PHRMD M TURNER @1311  03/31/2018 BY S GEZAHEGN Performed at [facility], [address], [place], [place] [address]    Culture (A)  Final    ESCHERICHIA COLI Confirmed Extended Spectrum Beta-Lactamase Producer (ESBL).  In bloodstream infections from ESBL organisms, carbapenems are preferred over piperacillin/tazobactam. They  are shown to have a lower risk of mortality.    Report Status 04/02/2018 FINAL  Final   Organism ID, Bacteria ESCHERICHIA COLI  Final      Susceptibility   Escherichia coli - MIC*    AMPICILLIN >=32 RESISTANT Resistant     CEFAZOLIN >=64 RESISTANT Resistant     CEFEPIME RESISTANT Resistant     CEFTAZIDIME RESISTANT Resistant     CEFTRIAXONE >=64 RESISTANT Resistant     CIPROFLOXACIN >=4 RESISTANT Resistant     GENTAMICIN <=1 SENSITIVE Sensitive     IMIPENEM <=0.25 SENSITIVE Sensitive     TRIMETH/SULFA <=20 SENSITIVE Sensitive     AMPICILLIN/SULBACTAM >=32 RESISTANT Resistant     PIP/TAZO 8 SENSITIVE Sensitive     Extended ESBL POSITIVE Resistant     * ESCHERICHIA COLI  Culture, blood (routine x 2)     Status: Abnormal   Collection Time: 03/30/18 10:12 PM  Result Value Ref Range Status   Specimen Description BLOOD RIGHT HAND  Final   Special Requests   Final    BOTTLES DRAWN AEROBIC AND ANAEROBIC Blood Culture adequate volume   Culture  Setup Time   Final    GRAM NEGATIVE RODS AEROBIC BOTTLE ONLY CRITICAL VALUE NOTED.  VALUE IS CONSISTENT WITH PREVIOUSLY REPORTED AND CALLED VALUE. Performed at [facility], [place] [address] [place], [place] [address]    Culture ESCHERICHIA COLI (A)  Final   Report Status 04/02/2018 FINAL  Final  Blood Culture ID Panel (Reflexed)     Status: Abnormal   Collection Time: 03/30/18 10:12 PM  Result Value Ref Range Status   Enterococcus species NOT DETECTED NOT DETECTED Final   Listeria monocytogenes NOT DETECTED NOT DETECTED Final   Staphylococcus species NOT DETECTED NOT DETECTED Final   Staphylococcus aureus (BCID) NOT DETECTED NOT DETECTED Final   Streptococcus species NOT DETECTED NOT DETECTED Final   Streptococcus agalactiae NOT DETECTED NOT DETECTED Final   Streptococcus pneumoniae NOT DETECTED NOT DETECTED Final   Streptococcus pyogenes NOT DETECTED NOT DETECTED Final   Acinetobacter baumannii NOT DETECTED NOT DETECTED Final    Enterobacteriaceae species DETECTED (A) NOT DETECTED Final    Comment: Enterobacteriaceae represent a large family of gram-negative bacteria, not a single organism. CRITICAL RESULT CALLED TO, READ BACK BY AND VERIFIED WITH: PHRMD M TURNER @1311  03/31/2018 BY S GEZAHEGN    Enterobacter cloacae complex  NOT DETECTED NOT DETECTED Final   Escherichia coli DETECTED (A) NOT DETECTED Final    Comment: CRITICAL RESULT CALLED TO, READ BACK BY AND VERIFIED WITH: PHRMD M TURNER @1311  03/31/2018 BY S GEZAHEGN    Klebsiella oxytoca NOT DETECTED NOT DETECTED Final   Klebsiella pneumoniae NOT DETECTED NOT DETECTED Final   Proteus species NOT DETECTED NOT DETECTED Final   Serratia marcescens NOT DETECTED NOT DETECTED Final   Carbapenem resistance NOT DETECTED NOT DETECTED Final   Haemophilus influenzae NOT DETECTED NOT DETECTED Final   Neisseria meningitidis NOT DETECTED NOT DETECTED Final   Pseudomonas aeruginosa NOT DETECTED NOT DETECTED Final   Candida albicans NOT DETECTED NOT DETECTED Final   Candida glabrata NOT DETECTED NOT DETECTED Final   Candida krusei NOT DETECTED NOT DETECTED Final   Candida parapsilosis NOT DETECTED NOT DETECTED Final   Candida tropicalis NOT DETECTED NOT DETECTED Final    Comment: Performed at [facility], [place] [address] [place], [place] [address]     Medications:   . enoxaparin (LOVENOX) injection  40 mg Subcutaneous Q24H  . folic acid  355 mcg Oral Daily  . hydrochlorothiazide  12.5 mg Oral Daily  . isosorbide mononitrate  60 mg Oral Daily  . nebivolol  10 mg Oral Daily  . pantoprazole  40 mg Oral Daily  . potassium chloride  40 mEq Oral Daily  . rOPINIRole  2 mg Oral QHS  . rosuvastatin  20 mg Oral QPM  . sodium chloride flush  10-40 mL Intracatheter Q12H   Continuous Infusions: . fluconazole (DIFLUCAN) IV Stopped (04/03/18 1528)  . meropenem (MERREM) IV 2 g (04/04/18 1002)     LOS: 4 days   Charlynne Cousins  [facility]  04/04/2018,  10:07 AM

## 2018-04-04 NOTE — Consult Note (Signed)
Surgical Consultation Requesting provider: Dr. Aileen Fass  CC: cholangititis  HPI: very pleasant 78yo male with significant medical/ cardiac history as listed below who was admitted on 3/11 and found to have cholangitis/ pancreatitis. He reports months of abdominal discomfort preceeding this. He underwent EUS and ERCP on 3/13 with Dr. Rush Landmark. Findings included sludge in the gallbladder and common duct on EUS as well as a significant stricture of the distal CBD. Non-filling of the cystic duct was also noted. Stent was placed. Cytology from brushings of the CBD stricture is pending. Surgery is asked to see regarding cholecystectomy.  Patient reports issues with dyspnea overnight which improve with lasix.   Allergies  Allergen Reactions  . Losartan Potassium Other (See Comments)    Hyperkalemia  . Tape Rash    Itching, reddened skin Itching, reddened skin    Past Medical History:  Diagnosis Date  . Allergy   . B12 deficiency anemia   . Blood transfusion without reported diagnosis   . CAD (coronary artery disease)   . Colon polyps   . COPD (chronic obstructive pulmonary disease) ([place])   . Depression   . Esophagus, Barrett's   . GERD (gastroesophageal reflux disease)   . History of bladder cancer    Bladder cancer "8 times"  . Hyperlipidemia   . Hypertension   . Localized osteoarthrosis, lower leg   . Restless leg syndrome   . Sleep apnea   . Stenosis of esophagus     Past Surgical History:  Procedure Laterality Date  . BACK SURGERY    . BILIARY BRUSHING  04/01/2018   Procedure: BILIARY BRUSHING;  Surgeon: Rush Landmark Telford Nab., MD;  Location: [facility];  Service: Gastroenterology;;  . BILIARY STENT PLACEMENT  04/01/2018   Procedure: BILIARY STENT PLACEMENT;  Surgeon: Irving Copas., MD;  Location: [facility];  Service: Gastroenterology;;  . BIOPSY  04/01/2018   Procedure: BIOPSY;  Surgeon: Irving Copas., MD;  Location: [facility];  Service:  Gastroenterology;;  . bladder cancer    . CERVICAL DISCECTOMY     ACDF  . COLONOSCOPY  11/17/2005   normal   . CORONARY ARTERY BYPASS GRAFT     x4  . ENDOSCOPIC RETROGRADE CHOLANGIOPANCREATOGRAPHY (ERCP) WITH PROPOFOL N/A 04/01/2018   Procedure: ENDOSCOPIC RETROGRADE CHOLANGIOPANCREATOGRAPHY (ERCP) WITH PROPOFOL;  Surgeon: Rush Landmark Telford Nab., MD;  Location: [facility];  Service: Gastroenterology;  Laterality: N/A;  . ESOPHAGOGASTRODUODENOSCOPY  04/29/2010  . ESOPHAGOGASTRODUODENOSCOPY (EGD) WITH PROPOFOL N/A 04/01/2018   Procedure: ESOPHAGOGASTRODUODENOSCOPY (EGD) WITH PROPOFOL;  Surgeon: Rush Landmark Telford Nab., MD;  Location: [facility];  Service: Gastroenterology;  Laterality: N/A;  . EUS  04/01/2018   Procedure: FULL UPPER ENDOSCOPIC ULTRASOUND (EUS) RADIAL;  Surgeon: Irving Copas., MD;  Location: [facility];  Service: Gastroenterology;;  . I&D EXTREMITY Left 11/24/2016   Procedure: IRRIGATION AND DEBRIDEMENT LEFT HAND, THUMB, INDEX, MIDDLE, RING, AND SMALL FINGERS WITH RECONSTRUCTION;  Surgeon: Roseanne Kaufman, MD;  Location: [facility];  Service: Orthopedics;  Laterality: Left;  . KNEE ARTHROSCOPY    . LUMBAR LAMINECTOMY     and fusion  . NASAL SINUS SURGERY    . POPLITEAL SYNOVIAL CYST EXCISION    . REMOVAL OF STONES  04/01/2018   Procedure: REMOVAL OF STONES;  Surgeon: Rush Landmark Telford Nab., MD;  Location: [facility];  Service: Gastroenterology;;  . Joan Mayans  04/01/2018   Procedure: Joan Mayans;  Surgeon: Mansouraty, Telford Nab., MD;  Location: [facility];  Service: Gastroenterology;;    Family History  Problem Relation Age of  Onset  . Melanoma Mother   . Stroke Father   . Hypertension Father   . Coronary artery disease Other   . Colon cancer Neg Hx   . Esophageal cancer Neg Hx   . Stomach cancer Neg Hx   . Rectal cancer Neg Hx     Social History   Socioeconomic History  . Marital status: Married    Spouse name: Not on file  . Number of  children: Not on file  . Years of education: Not on file  . Highest education level: Not on file  Occupational History  . Not on file  Social Needs  . Financial resource strain: Not on file  . Food insecurity:    Worry: Not on file    Inability: Not on file  . Transportation needs:    Medical: Not on file    Non-medical: Not on file  Tobacco Use  . Smoking status: Former Smoker    Last attempt to quit: 03/30/1976    Years since quitting: 42.0  . Smokeless tobacco: Never Used  . Tobacco comment: quit in 1978  Substance and Sexual Activity  . Alcohol use: No    Alcohol/week: 0.0 standard drinks  . Drug use: No  . Sexual activity: Yes  Lifestyle  . Physical activity:    Days per week: Not on file    Minutes per session: Not on file  . Stress: Not on file  Relationships  . Social connections:    Talks on phone: Not on file    Gets together: Not on file    Attends religious service: Not on file    Active member of club or organization: Not on file    Attends meetings of clubs or organizations: Not on file    Relationship status: Not on file  Other Topics Concern  . Not on file  Social History Narrative  . Not on file    No current facility-administered medications on file prior to encounter.    Current Outpatient Medications on File Prior to Encounter  Medication Sig Dispense Refill  . acetaminophen (TYLENOL) 325 MG tablet Take 650 mg by mouth every 6 (six) hours as needed for mild pain.    [name] aspirin 81 MG tablet Take 81 mg by mouth daily.      [name] BYSTOLIC 10 MG tablet TAKE ONE TABLET BY MOUTH ONCE DAILY (Patient taking differently: Take 10 mg by mouth daily. ) 45 tablet 7  . cyanocobalamin (,VITAMIN B-12,) 1000 MCG/ML injection INJECT 1 ML (CC) INTRAMUSCULARLY FOR 1 DOSE (Patient taking differently: Inject 1,000 mcg into the muscle once. ) 6 mL 3  . dicyclomine (BENTYL) 10 MG capsule Take 1 capsule (10 mg total) by mouth every 8 (eight) hours as needed for spasms. 3060  capsule 1  . etodolac (LODINE) 300 MG capsule TAKE 1 CAPSULE BY MOUTH EVERY 8 HOURS (Patient taking differently: Take 300 mg by mouth every 8 (eight) hours. ) 180 capsule 1  . ezetimibe (ZETIA) 10 MG tablet TAKE 1 TABLET BY MOUTH ONCE DAILY (Patient taking differently: Take 10 mg by mouth daily. ) 90 tablet 2  . folic acid (FOLVITE) 585 MCG tablet Take 400 mcg by mouth daily.      [name] gabapentin (NEURONTIN) 600 MG tablet Take 1 tablet (600 mg total) by mouth 3 (three) times daily. 90 tablet 1  . hydrochlorothiazide (MICROZIDE) 12.5 MG capsule Take 1 capsule (12.5 mg total) by mouth daily. 90 capsule 3  . isosorbide mononitrate (  IMDUR) 60 MG 24 hr tablet TAKE 1 TABLET BY MOUTH ONCE DAILY (Patient taking differently: Take 60 mg by mouth daily. ) 90 tablet 3  . nitroGLYCERIN (NITROSTAT) 0.4 MG SL tablet Place 1 tablet (0.4 mg total) under the tongue every 5 (five) minutes as needed for chest pain. 30 tablet 3  . omeprazole (PRILOSEC) 40 MG capsule Take 1 capsule (40 mg total) by mouth daily. 30 capsule 6  . oxyCODONE-acetaminophen (PERCOCET) 10-325 MG tablet Take 1 tablet by mouth 2 (two) times daily as needed. 60 tablet 0  . Probiotic Product (PROBIOTIC ADVANCED PO) Take 1 tablet by mouth daily.    [name] rOPINIRole (REQUIP) 2 MG tablet TAKE 1 TABLET BY MOUTH AT BEDTIME (Patient taking differently: Take 2 mg by mouth at bedtime. ) 90 tablet 1  . rosuvastatin (CRESTOR) 20 MG tablet TAKE 1 TABLET BY MOUTH ONCE DAILY (Patient taking differently: Take 20 mg by mouth daily. ) 90 tablet 0  . venlafaxine (EFFEXOR) 75 MG tablet Take 1 tablet (75 mg total) by mouth daily. 90 tablet 1  . NEEDLE, DISP, 25 G (B-D DISP NEEDLE 25GX1") 25G X 1" MISC Inject 1000 mcg into muscle once a month. 50 each 0    Review of Systems: a complete, 10pt review of systems was completed with pertinent positives and negatives as documented in the HPI  Physical Exam: Vitals:   04/04/18 0124 04/04/18 0405  BP:  (!) 164/89  Pulse: 84 95   Resp: 20 19  Temp:  98.3 F (36.8 C)  SpO2: 97% 100%   Gen: A&Ox3, no distress, eating breakfast Head: normocephalic, atraumatic Eyes: extraocular motions intact, anicteric.  Neck: supple without mass or thyromegaly Chest: unlabored respirations, symmetrical air entry, clear bilaterally. Healed sternotomy/ drain sites   Cardiovascular: RRR with palpable distal pulses, no pedal edema Abdomen: soft, nondistended, nontender. No mass or organomegaly.  Extremities: warm, without edema, no deformities  Neuro: grossly intact Psych: appropriate mood and affect, normal insight  Skin: warm and dry   CBC Latest Ref Rng & Units 04/04/2018 04/01/2018 03/31/2018  WBC 4.0 - 10.5 K/uL 11.7(H) 11.4(H) 12.6(H)  Hemoglobin 13.0 - 17.0 g/dL 9.7(L) 9.6(L) 11.7(L)  Hematocrit 39.0 - 52.0 % 30.4(L) 31.1(L) 36.0(L)  Platelets 150 - 400 K/uL 130(L) PLATELET CLUMPS NOTED ON SMEAR, UNABLE TO ESTIMATE PLATELET CLUMPS NOTED ON SMEAR, UNABLE TO ESTIMATE    CMP Latest Ref Rng & Units 04/04/2018 04/04/2018 04/03/2018  Glucose 70 - 99 mg/dL 207(H) 146(H) 197(H)  BUN 8 - 23 mg/dL 31(H) 32(H) 32(H)  Creatinine 0.61 - 1.24 mg/dL 1.74(H) 1.68(H) 1.88(H)  Sodium 135 - 145 mmol/L 141 140 139  Potassium 3.5 - 5.1 mmol/L 4.0 4.1 3.6  Chloride 98 - 111 mmol/L 110 112(H) 111  CO2 22 - 32 mmol/L 23 25 21(L)  Calcium 8.9 - 10.3 mg/dL 8.5(L) 8.0(L) 7.7(L)  Total Protein 6.5 - 8.1 g/dL - - -  Total Bilirubin 0.3 - 1.2 mg/dL - - -  Alkaline Phos 38 - 126 U/L - - -  AST 15 - 41 U/L - - -  ALT 0 - 44 U/L - - -    Lab Results  Component Value Date   INR 1.0 RATIO 04/28/2006    Imaging: Dg Chest Port 1 View  Result Date: 04/04/2018 CLINICAL DATA:  Flash pulmonary edema, bilat lower extremity swelling EXAM: PORTABLE CHEST 1 VIEW COMPARISON:  Chest CT, 04/04/2010.  Chest radiographs, 05/24/2009. FINDINGS: Cardiac silhouette is normal in size. There stable changes  from previous CABG surgery. No mediastinal or hilar masses. No  convincing adenopathy. Stable scarring in the left mid to lower lung with blunting of the left costophrenic sulci. Lungs otherwise clear. No pleural effusion or pneumothorax. Right PICC has its tip in the lower superior vena cava. Skeletal structures grossly intact. Stable changes from a previous anterior cervical spine fusion. IMPRESSION: 1. No acute cardiopulmonary disease. No radiographic evidence of pulmonary edema. Electronically Signed   By: Lajean Manes M.D.   On: 04/04/2018 01:05   [place] Ekg Site Rite  Result Date: 04/02/2018 If Site Rite image not attached, placement could not be confirmed due to current cardiac rhythm.   A/P: Biliary stricture s/p stent. Cytology pending. Sludge noted on EUS, possible small stones in the gallbladder neck noted on MRCP. ESBL e coli bacteremia on antibiotics.  -Await cytology results prior to considering lap chole. The stricture is the likely etiology of the cholangitis/ bacteremia, not the sludge. He has an increased risk of periop morbidity/ mortality and we discussed this.    Romana Juniper, MD [facility] Surgery, [place] Pager [phone]

## 2018-04-04 NOTE — TOC Initial Note (Signed)
Transition of Care [facility]) - Initial/Assessment Note    Patient Details  Name: [name] MRN: [id number] Date of Birth: September 09, 1940  Transition of Care [facility]) CM/SW Contact:    Marilu Favre, RN Phone Number: 04/04/2018, 2:26 PM  Clinical Narrative:                  Confirmed face sheet information. Patient has transportation to appointments and can have prescriptions filled.   Patient aware home health nurse will not be present every time a dose of IV antibiotic is due. He and his daughter will be trained how to administer the medication.   PT recommendation is for 24 hour assistance at home. Patient lives alone states his daughter is right next door. Patient understands recommendation of 24 hour assistance but does not feel that he needs anyone to stay with him. His has family close by.   Daughter is a Therapist, sports.   Patient already has walker and 3 in 1 at home.   Referral given to Carolynn Sayers with [facility] and accepted. Referral for home health RN given to [facility] with [facility] awaiting call back.   Spoke with MD, he is aware will need order for home health RN .   Expected Discharge Plan: [place] Barriers to Discharge: No Barriers Identified   Patient Goals and CMS Choice Patient states their goals for this hospitalization and ongoing recovery are:: to go home  CMS Medicare.gov Compare Post Acute Care list provided to:: Patient Choice offered to / list presented to : Patient  Expected Discharge Plan and Services Expected Discharge Plan: [place] Discharge Planning Services: CM Consult Post Acute Care Choice: [place] arrangements for the past 2 months: Single Family Home Expected Discharge Date: 04/03/18                   [facility] Arranged: RN    Prior Living Arrangements/Services Living arrangements for the past 2 months: Single Family Home Lives with:: Self Patient language and need for  interpreter reviewed:: Yes Do you feel safe going back to the place where you live?: Yes      Need for Family Participation in Patient Care: Yes (Comment) Care giver support system in place?: Yes (comment) Current home services: DME(has walker and 3 in 1 at home already ) Criminal Activity/Legal Involvement Pertinent to Current Situation/Hospitalization: No - Comment as needed  Activities of Daily Living Home Assistive Devices/Equipment: None ADL Screening (condition at time of admission) Patient's cognitive ability adequate to safely complete daily activities?: Yes Is the patient deaf or have difficulty hearing?: No Does the patient have difficulty seeing, even when wearing glasses/contacts?: No Does the patient have difficulty concentrating, remembering, or making decisions?: No Patient able to express need for assistance with ADLs?: Yes Does the patient have difficulty dressing or bathing?: No Independently performs ADLs?: Yes (appropriate for developmental age) Does the patient have difficulty walking or climbing stairs?: No Weakness of Legs: None Weakness of Arms/Hands: None  Permission Sought/Granted Permission sought to share information with : Case Manager Permission granted to share information with : Yes, Verbal Permission Granted  Share Information with NAME: Zettie Pho   Permission granted to share info w AGENCY: [facility], [place] granted to share info w Relationship: daughter   Permission granted to share info w Contact Information: yes   Emotional Assessment Appearance:: Appears stated age Attitude/Demeanor/Rapport: Engaged, Self-Confident Affect (  typically observed): Accepting Orientation: : Oriented to Self, Oriented to Place, Oriented to  Time, Oriented to Situation   Psych Involvement: No (comment)  Admission diagnosis:  Acute pancreatitis, unspecified complication status, unspecified pancreatitis type [K85.90] Patient  Active Problem List   Diagnosis Date Noted  . ESBL (extended spectrum beta-lactamase) producing bacteria infection 04/04/2018  . Ascending cholangitis 04/01/2018  . Diabetes mellitus type 2, noninsulin dependent ([place]) 03/31/2018  . Pancreatitis 03/31/2018  . Sepsis ([place]) 03/31/2018  . Bacteremia due to Gram-negative bacteria 03/31/2018  . Abdominal pain   . Dilated bile duct   . Acute biliary pancreatitis 03/30/2018  . Leukocytosis 03/30/2018  . CAD (coronary artery disease) 03/30/2018  . Osteoarthritis of left knee 10/18/2017  . Cervical radiculopathy 10/04/2017  . Pain of left hand 07/19/2017  . Pre-operative cardiovascular examination 06/16/2017  . CKD (chronic kidney disease), stage III ([place]) 06/16/2017  . Carpal tunnel syndrome 05/27/2017  . 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 05/10/2017  . Open fracture of base of middle phalanx of finger 02/17/2017  . Laceration of index finger 02/17/2017  . Laceration of nail bed of finger 02/17/2017  . Laceration of thumb 02/17/2017  . Open fracture of distal phalanx of finger 02/17/2017  . Open fractures of multiple sites of phalanx of left hand 11/24/2016  . B12 deficiency 05/28/2015  . GERD (gastroesophageal reflux disease) 11/16/2014  . Hyperlipidemia LDL goal <70 11/21/2013  . Exertional shortness of breath 11/21/2013  . Other dysphagia 07/14/2013  . History of esophageal stricture 07/14/2013  . Hx of CABG 06/05/2013  . Leg pain, bilateral 10/18/2012  . Restless leg syndrome 06/21/2012  . ULNAR NEUROPATHY, LEFT 08/05/2009  . Urinary obstruction 10/19/2008  . ACTINIC KERATOSIS 06/25/2008  . DRY EYE SYNDROME 10/13/2007  . CARCINOMA, BLADDER, HX OF 07/02/2007  . BARRETTS ESOPHAGUS 05/23/2007  . Hypothyroidism 04/08/2007  . Other malaise and fatigue 04/08/2007  . Essential hypertension 10/12/2006  . ANEMIA, B12 DEFICIENCY 09/08/2006  . DEPRESSION 07/29/2006  . NEUROPATHY, IDIOPATHIC PERIPHERAL NEC 07/29/2006  . Allergic  rhinitis 07/29/2006  . LOW BACK PAIN 07/29/2006  . COLONIC POLYPS 11/04/2000   PCP:  Isaac Bliss, Rayford Halsted, MD Pharmacy:   [place], [place] - [place] [address] [place] [place] [address] Phone: [phone] Fax: [place] [facility] [address] [place] [address] [place] [address] Phone: [phone] Fax: [phone]     Social Determinants of Health ([place]) Interventions    Readmission Risk Interventions 30 Day Unplanned Readmission Risk Score     ED to Hosp-Admission (Current) from 03/30/2018 in [place]  30 Day Unplanned Readmission Risk Score (%)  15 Filed at 04/04/2018 1200     This score is the patient's risk of an unplanned readmission within 30 days of being discharged (0 -100%). The score is based on dignosis, age, lab data, medications, orders, and past utilization.   Low:  0-14.9   Medium: 15-21.9   High: 22-29.9   Extreme: 30 and above       No flowsheet data found.

## 2018-04-04 NOTE — Progress Notes (Signed)
Physical Therapy Treatment Patient Details Name: [name] MRN: [id number] DOB: [birth year] Today's Date: 04/04/2018    History of Present Illness [name] is a 78 y.o. male with medical history significant for coronary artery disease, bladder cancer, hypertension, Barrett esophagus, hiatal hernia, and GERD,  presented to the emergency department for evaluation of severe upper abdominal pain with nausea and shaking chills. Acute biliary pancreatitis with dilated common bile duct ERCP with sphincterotomy and stent stent placement    PT Comments    Continuing work on functional mobility and activity tolerance;  Walked entire unit with RW today, he used it well, somewhat fatigued at end of walk, but not as fatigued as when he walked the unit with Tameika, Mobility tech this morning   Follow Up Recommendations  No PT follow up;Other (comment);Supervision/Assistance - 24 hour(HHRN for IV antibiotics)     Equipment Recommendations  Rolling walker with 5" wheels    Recommendations for Other Services       Precautions / Restrictions Precautions Precautions: Other (comment) Precaution Comments: Contact Precautions    Mobility  Bed Mobility                  Transfers Overall transfer level: Needs assistance Equipment used: None Transfers: Sit to/from Stand Sit to Stand: Supervision         General transfer comment: Supervision for safety  Ambulation/Gait Ambulation/Gait assistance: Supervision Gait Distance (Feet): 550 Feet Assistive device: Rolling walker (2 wheeled) Gait Pattern/deviations: Step-through pattern Gait velocity: slowed   General Gait Details: Cues to self-monitor for activity tolerance   Stairs             Wheelchair Mobility    Modified Rankin (Stroke Patients Only)       Balance Overall balance assessment: Mild deficits observed, not formally tested                                           Cognition Arousal/Alertness: Awake/alert Behavior During Therapy: WFL for tasks assessed/performed Overall Cognitive Status: Within Functional Limits for tasks assessed                                        Exercises      General Comments        Pertinent Vitals/Pain Pain Assessment: No/denies pain    Home Living                      Prior Function            PT Goals (current goals can now be found in the care plan section) Acute Rehab PT Goals Patient Stated Goal: be able to go to his home PT Goal Formulation: With patient Time For Goal Achievement: 04/17/18 Potential to Achieve Goals: Good Progress towards PT goals: Progressing toward goals    Frequency    Min 3X/week      PT Plan Current plan remains appropriate    Co-evaluation              AM-PAC PT "6 Clicks" Mobility   Outcome Measure  Help needed turning from your back to your side while in a flat bed without using bedrails?: None Help needed moving from lying on your back to sitting on the side  of a flat bed without using bedrails?: None Help needed moving to and from a bed to a chair (including a wheelchair)?: A Little Help needed standing up from a chair using your arms (e.g., wheelchair or bedside chair)?: None Help needed to walk in hospital room?: None Help needed climbing 3-5 steps with a railing? : A Little 6 Click Score: 22    End of Session   Activity Tolerance: Patient tolerated treatment well Patient left: in chair;with call bell/phone within reach Nurse Communication: Mobility status PT Visit Diagnosis: Unsteadiness on feet (R26.81);Other abnormalities of gait and mobility (R26.89)     Time: 4360-6770 PT Time Calculation (min) (ACUTE ONLY): 22 min  Charges:  $Gait Training: 8-22 mins                     Roney Marion, [place] Pager [phone] Office [place] 04/04/2018, 5:00 PM

## 2018-04-04 NOTE — Progress Notes (Signed)
Paged by bedside RN regarding pts increasing SOB and dyspnea. Pts O2 sats were 97% on RA and RR: 25. Lactic Acid previously drawn was 0.8. Pt received an Albuterol neb prior to my arrival. Upon entering room pt was sitting up in bed A&Ox4 and in NAD. Slightly tachypneic with a RR of 20 and some accessory muscle use. He states that he feels better after the breathing treatment. LS: Coarse with slight crackles noted bilaterally. Also noted to have +1 pitting edema in the BLE. Will order 40mg  of Lasix IV x1 and informed bedside RN to page if pts remains SOB.   Arby Barrette AGPCNP-C, AGNP-C [facility] Pager [phone]

## 2018-04-05 LAB — BASIC METABOLIC PANEL
Anion gap: 9 (ref 5–15)
BUN: 32 mg/dL — AB (ref 8–23)
CALCIUM: 8.7 mg/dL — AB (ref 8.9–10.3)
CO2: 26 mmol/L (ref 22–32)
CREATININE: 1.8 mg/dL — AB (ref 0.61–1.24)
Chloride: 106 mmol/L (ref 98–111)
GFR calc Af Amer: 42 mL/min — ABNORMAL LOW (ref >60)
GFR calc non Af Amer: 36 mL/min — ABNORMAL LOW (ref >60)
Glucose, Bld: 132 mg/dL — ABNORMAL HIGH (ref 70–99)
Potassium: 4.5 mmol/L (ref 3.5–5.1)
Sodium: 141 mmol/L (ref 135–145)

## 2018-04-05 LAB — GLUCOSE, CAPILLARY
Glucose-Capillary: 108 mg/dL — ABNORMAL HIGH (ref 70–99)
Glucose-Capillary: 124 mg/dL — ABNORMAL HIGH (ref 70–99)
Glucose-Capillary: 134 mg/dL — ABNORMAL HIGH (ref 70–99)
Glucose-Capillary: 136 mg/dL — ABNORMAL HIGH (ref 70–99)
Glucose-Capillary: 147 mg/dL — ABNORMAL HIGH (ref 70–99)
Glucose-Capillary: 169 mg/dL — ABNORMAL HIGH (ref 70–99)

## 2018-04-05 MED ORDER — FLUCONAZOLE 100 MG PO TABS
100.0000 mg | ORAL_TABLET | Freq: Every day | ORAL | Status: DC
  Administered 2018-04-05 – 2018-04-06 (×2): 100 mg via ORAL
  Filled 2018-04-05 (×2): qty 1

## 2018-04-05 NOTE — Care Management Important Message (Signed)
Important Message  Patient Details  Name: [name] MRN: [id number] Date of Birth: 08-31-1940   Medicare Important Message Given:  Yes    Orbie Pyo 04/05/2018, 10:06 AM

## 2018-04-05 NOTE — Progress Notes (Signed)
Physical Therapy Treatment Patient Details Name: [name] MRN: [id number] DOB: Jan 28, 1940 Today's Date: 04/05/2018    History of Present Illness [name] is a 78 y.o. male with medical history significant for coronary artery disease, bladder cancer, hypertension, Barrett esophagus, hiatal hernia, and GERD,  presented to the emergency department for evaluation of severe upper abdominal pain with nausea and shaking chills. Acute biliary pancreatitis with dilated common bile duct ERCP with sphincterotomy and stent stent placement    PT Comments    Pt coming out of restroom on entry and agreeable to ambulation with therapy. Pt reports not using RW for ambulation today and request using IV pole. Pt ambulated 600 feet with min guard assist, and took 1x seated rest break half way. Pt grandson coming to visit and request to return to room. D/c plans remain appropriate at this time. PT will continue to follow acutely and perform stair training.    Follow Up Recommendations  No PT follow up;Other (comment);Supervision/Assistance - 24 hour(HHRN for IV antibiotics)     Equipment Recommendations  Rolling walker with 5" wheels       Precautions / Restrictions Precautions Precautions: Other (comment) Precaution Comments: Contact Precautions Restrictions Weight Bearing Restrictions: No    Mobility  Bed Mobility                  Transfers Overall transfer level: Needs assistance Equipment used: None Transfers: Sit to/from Stand Sit to Stand: Supervision         General transfer comment: Supervision for safety  Ambulation/Gait Ambulation/Gait assistance: Supervision Gait Distance (Feet): 600 Feet Assistive device: IV Pole Gait Pattern/deviations: Step-through pattern Gait velocity: slowed Gait velocity interpretation: <1.8 ft/sec, indicate of risk for recurrent falls General Gait Details: 1x seated rest break         Balance Overall balance assessment:  Mild deficits observed, not formally tested                                          Cognition Arousal/Alertness: Awake/alert Behavior During Therapy: WFL for tasks assessed/performed Overall Cognitive Status: Within Functional Limits for tasks assessed                                               Pertinent Vitals/Pain Pain Assessment: No/denies pain           PT Goals (current goals can now be found in the care plan section) Acute Rehab PT Goals Patient Stated Goal: be able to go to his home PT Goal Formulation: With patient Time For Goal Achievement: 04/17/18 Potential to Achieve Goals: Good Progress towards PT goals: Progressing toward goals    Frequency    Min 3X/week      PT Plan Current plan remains appropriate       AM-PAC PT "6 Clicks" Mobility   Outcome Measure  Help needed turning from your back to your side while in a flat bed without using bedrails?: None Help needed moving from lying on your back to sitting on the side of a flat bed without using bedrails?: None Help needed moving to and from a bed to a chair (including a wheelchair)?: A Little Help needed standing up from a chair using your arms (e.g., wheelchair or bedside  chair)?: None Help needed to walk in hospital room?: None Help needed climbing 3-5 steps with a railing? : A Little 6 Click Score: 22    End of Session Equipment Utilized During Treatment: Gait belt Activity Tolerance: Patient tolerated treatment well Patient left: in chair;with call bell/phone within reach Nurse Communication: Mobility status PT Visit Diagnosis: Unsteadiness on feet (R26.81);Other abnormalities of gait and mobility (R26.89)     Time: 1550-1610 PT Time Calculation (min) (ACUTE ONLY): 20 min  Charges:  $Gait Training: 8-22 mins                     [name] B. Migdalia Dk PT, DPT Acute Rehabilitation Services Pager [phone] Office [phone]    [place] 04/05/2018, 5:39 PM

## 2018-04-05 NOTE — Progress Notes (Signed)
[facility] PROGRESS NOTE    Progress Note  [name]  KGY:[id number] DOB: April 06, 1940 DOA: 03/30/2018 PCP: [name], Jacob Halsted, MD     Brief Narrative:   [name] is an 78 y.o. male past medical history of CAD, bladder cancer essential hypertension, esophageal stenosis status post dilation who presents with complaint of epigastric pain after eating was found to have biliary pancreatitis  Events: 03/30/2018 blood culture showed [name] 03/30/2018 CT scan of the abdomen and pelvis showed biliary duct dilation 03/30/2018 MRCP showed acute uncomplicated pancreatitis. Mild gallbladder distension and mild pericholecystic fluid but no gallbladder wall thickening or other definite findings for acute Cholecystitis.  Stable appearing intra and extrahepatic biliary dilatation. Possible distal stricture. No common bile duct stones. 04/01/2018 ERCP: Esophageal mucosa compatible with Candida, gastric mucosal atrophy Congested erythematous friable mucosa of the pylorus, no gross lesions in the duodenum, EUS showed peripancreatic abnormalities consisting with pancreatitis.,  But the head of the pancreas was not grossly abnormal, pancreatic duct are normal sonographic appearance, there was dilation of the common bile duct into the hepatic duct, there was a suggested stricture in the lower third common bile duct and hyperechoic material compatible with sludge.                   Assessment/Plan:   Sepsis ([place]) to ascending cholangitis/gram-negative bacteremia: Blood cultures were positive for ESBL [name], change antibiotics to IV meropenem started is 04/02/2018. MRCP on 03/30/2018 showed acute pancreatitis, mild gallbladder distention with pericholecystic fluid and gallbladder wall thickening concerning for acute cholecystitis, he had intrahepatic and extrahepatic bile duct dilation with a possible stricture no common bile duct identified GI was consulted recommended an ERCP on  04/01/2018 show stricture and sludge no stones, stent placed   2 weeks of antibiotics for his ESBL bacteremia end date 04/12/2018 Sepsis physiology has resolved.  Acute biliary pancreatitis: CT scan and MRCP showed acute biliary dilation of the near the ampulla. ERCP performed with sphincterotomy and stent placement.  Brushing of Common bile duct  showed atypical cells present. Awaiting surgery recommendations. PICC line inserted on 3.14.2020. Continue narcotics at his home dose.  Esophageal candidiasis: Changed to oral Diflucan  Acute kidney injury on  CKD (chronic kidney disease), stage III ([place]): With a baseline creatinine around 1.3-1.6.   In  the setting of sepsis, NSAID use and IV contrast (03/30/2018) and possibly IV vancomycin. Give IV Lasix on 04/04/2018, for dyspnea which she has at home. Patient is just mildly positive today, his creatinine today is 1.8. We will try to avoid diuretic therapy. His creatinine is close to baseline.  He always has trace lower extremity edema.  HyPerkalemia: Hypokalemic will replete orally.  Essential hypertension: Continue to hold Lasix. Continue Bystolic, blood pressure mildly elevated will continue to monitor.  History of esophagitis: Protonix oral.  Dyslipidemia: Hold simvastatin and Zetia.  Pre-op evaluation: 2D echo last year showed an EF of 60% with grade 2 diastolic heart failure. Patient has agreed to proceed with surgical intervention despite risk and benefits. Cardiology was consulted recommended no further testing.   DVT prophylaxis: lovenox Family Communication:daughter Disposition Plan/Barrier to D/C: Per surgery Code Status:     Code Status Orders  (From admission, onward)         Start     Ordered   03/30/18 2350  Full code  Continuous     03/30/18 2353        Code Status History    Date  Active Date Inactive Code Status Order ID Comments User Context   11/24/2016 2337 11/25/2016 1700 Full Code [id number]   Avelina Laine, PA-C Inpatient        IV Access:    Peripheral IV   Procedures and diagnostic studies:   Dg Chest Port 1 View  Result Date: 04/04/2018 CLINICAL DATA:  Flash pulmonary edema, bilat lower extremity swelling EXAM: PORTABLE CHEST 1 VIEW COMPARISON:  Chest CT, 04/04/2010.  Chest radiographs, 05/24/2009. FINDINGS: Cardiac silhouette is normal in size. There stable changes from previous CABG surgery. No mediastinal or hilar masses. No convincing adenopathy. Stable scarring in the left mid to lower lung with blunting of the left costophrenic sulci. Lungs otherwise clear. No pleural effusion or pneumothorax. Right PICC has its tip in the lower superior vena cava. Skeletal structures grossly intact. Stable changes from a previous anterior cervical spine fusion. IMPRESSION: 1. No acute cardiopulmonary disease. No radiographic evidence of pulmonary edema. Electronically Signed   By: Lajean Manes M.D.   On: 04/04/2018 01:05     Medical Consultants:    None.  Anti-Infectives:   IV meropenem  Subjective:    [name] tolerating his diet.  Objective:    Vitals:   04/04/18 0405 04/04/18 1430 04/04/18 2218 04/05/18 0512  BP: (!) 164/89 (!) 153/84 (!) 160/84 (!) 156/81  Pulse: 95 71 82 76  Resp: 19 16 17    Temp: 98.3 F (36.8 C) 99.2 F (37.3 C) 100.1 F (37.8 C) 98.9 F (37.2 C)  TempSrc: Oral Oral Oral Oral  SpO2: 100% 98% 98% (!) 83%  Weight:      Height:        Intake/Output Summary (Last 24 hours) at 04/05/2018 1034 Last data filed at 04/05/2018 0831 Gross per 24 hour  Intake 572.88 ml  Output 1950 ml  Net -1377.12 ml   Filed Weights   03/31/18 0000 03/31/18 1531  Weight: 84.4 kg 84 kg    Exam: General exam: In no acute distress. Respiratory system: Good air movement and clear to auscultation. Cardiovascular system: S1 & S2 heard, RRR.  Gastrointestinal system: Abdomen is nondistended, soft and nontender.  Central nervous system: Alert  and oriented. No focal neurological deficits. Extremities: No pedal edema. Skin: No rashes, lesions or ulcers Psychiatry: Judgement and insight appear normal. Mood & affect appropriate.    Data Reviewed:    Labs: Basic Metabolic Panel: Recent Labs  Lab 04/03/18 0412 04/04/18 0123 04/04/18 0349 04/04/18 1140 04/05/18 0435  NA 139 140 141 140 141  K 3.6 4.1 4.0 3.7 4.5  CL 111 112* 110 105 106  CO2 21* 25 23 25 26   GLUCOSE 197* 146* 207* 247* 132*  BUN 32* 32* 31* 32* 32*  CREATININE 1.88* 1.68* 1.74* 1.79* 1.80*  CALCIUM 7.7* 8.0* 8.5* 8.4* 8.7*  MG  --  1.2*  --   --   --    GFR Estimated Creatinine Clearance: 35.5 mL/min (A) (by C-G formula based on SCr of 1.8 mg/dL (H)). Liver Function Tests: Recent Labs  Lab 03/30/18 1616 03/31/18 0508 04/01/18 0406 04/02/18 0326  AST 70* 43* 58* 49*  ALT 32 26 24 22   ALKPHOS 90 65 61 47  BILITOT 1.5* 2.1* 1.1 0.6  PROT 7.6 6.1* 6.1* 5.6*  ALBUMIN 3.8 2.9* 2.7* 2.3*   Recent Labs  Lab 03/30/18 1616 03/31/18 0508 04/03/18 0412  LIPASE 390* 1,793* 174*   No results for input(s): AMMONIA in the last 168 hours. Coagulation profile No results  for input(s): INR, PROTIME in the last 168 hours.  CBC: Recent Labs  Lab 03/30/18 1616 03/31/18 0508 04/01/18 0754 04/04/18 0123  WBC 21.4* 12.6* 11.4* 11.7*  NEUTROABS  --  11.3* 10.1* 9.0*  HGB 13.2 11.7* 9.6* 9.7*  HCT 41.5 36.0* 31.1* 30.4*  MCV 87.7 87.4 89.1 89.7  PLT 186 PLATELET CLUMPS NOTED ON SMEAR, UNABLE TO ESTIMATE PLATELET CLUMPS NOTED ON SMEAR, UNABLE TO ESTIMATE 130*   Cardiac Enzymes: No results for input(s): CKTOTAL, CKMB, CKMBINDEX, TROPONINI in the last 168 hours. BNP (last 3 results) No results for input(s): PROBNP in the last 8760 hours. CBG: Recent Labs  Lab 04/04/18 1634 04/04/18 2224 04/05/18 0034 04/05/18 0508 04/05/18 0751  GLUCAP 129* 133* 136* 108* 147*   D-Dimer: No results for input(s): DDIMER in the last 72 hours. Hgb A1c: No  results for input(s): HGBA1C in the last 72 hours. Lipid Profile: No results for input(s): CHOL, HDL, LDLCALC, TRIG, CHOLHDL, LDLDIRECT in the last 72 hours. Thyroid function studies: No results for input(s): TSH, T4TOTAL, T3FREE, THYROIDAB in the last 72 hours.  Invalid input(s): FREET3 Anemia work up: No results for input(s): VITAMINB12, FOLATE, FERRITIN, TIBC, IRON, RETICCTPCT in the last 72 hours. Sepsis Labs: Recent Labs  Lab 03/30/18 1616  03/31/18 0109 03/31/18 0508 04/01/18 0754 04/02/18 0326 04/04/18 0123  WBC 21.4*  --   --  12.6* 11.4*  --  11.7*  LATICACIDVEN  --    < > 1.7 2.2*  --  1.3 0.8   < > = values in this interval not displayed.   Microbiology Recent Results (from the past 240 hour(s))  Culture, blood (routine x 2)     Status: Abnormal   Collection Time: 03/30/18 10:12 PM  Result Value Ref Range Status   Specimen Description BLOOD RIGHT FOREARM  Final   Special Requests   Final    BOTTLES DRAWN AEROBIC AND ANAEROBIC Blood Culture adequate volume   Culture  Setup Time   Final    GRAM NEGATIVE RODS CRITICAL RESULT CALLED TO, READ BACK BY AND VERIFIED WITH: PHRMD M TURNER @1311  03/31/2018 BY S GEZAHEGN Performed at [facility], [address] [place], [place] [address]    Culture (A)  Final    ESCHERICHIA Hughes Confirmed Extended Spectrum Beta-Lactamase Producer (ESBL).  In bloodstream infections from ESBL organisms, carbapenems are preferred over piperacillin/tazobactam. They are shown to have a lower risk of mortality.    Report Status 04/02/2018 FINAL  Final   Organism ID, Bacteria ESCHERICHIA Hughes  Final      Susceptibility   Escherichia Hughes - MIC*    AMPICILLIN >=32 RESISTANT Resistant     CEFAZOLIN >=64 RESISTANT Resistant     CEFEPIME RESISTANT Resistant     CEFTAZIDIME RESISTANT Resistant     CEFTRIAXONE >=64 RESISTANT Resistant     CIPROFLOXACIN >=4 RESISTANT Resistant     GENTAMICIN <=1 SENSITIVE Sensitive     IMIPENEM <=0.25  SENSITIVE Sensitive     TRIMETH/SULFA <=20 SENSITIVE Sensitive     AMPICILLIN/SULBACTAM >=32 RESISTANT Resistant     PIP/TAZO 8 SENSITIVE Sensitive     Extended ESBL POSITIVE Resistant     * ESCHERICHIA Hughes  Culture, blood (routine x 2)     Status: Abnormal   Collection Time: 03/30/18 10:12 PM  Result Value Ref Range Status   Specimen Description BLOOD RIGHT HAND  Final   Special Requests   Final    BOTTLES DRAWN AEROBIC AND ANAEROBIC Blood  Culture adequate volume   Culture  Setup Time   Final    GRAM NEGATIVE RODS AEROBIC BOTTLE ONLY CRITICAL VALUE NOTED.  VALUE IS CONSISTENT WITH PREVIOUSLY REPORTED AND CALLED VALUE. Performed at [facility], [place] [address] [place], [place] [address]    Culture ESCHERICHIA Hughes (A)  Final   Report Status 04/02/2018 FINAL  Final  Blood Culture ID Panel (Reflexed)     Status: Abnormal   Collection Time: 03/30/18 10:12 PM  Result Value Ref Range Status   Enterococcus species NOT DETECTED NOT DETECTED Final   Listeria monocytogenes NOT DETECTED NOT DETECTED Final   Staphylococcus species NOT DETECTED NOT DETECTED Final   Staphylococcus aureus (BCID) NOT DETECTED NOT DETECTED Final   Streptococcus species NOT DETECTED NOT DETECTED Final   Streptococcus agalactiae NOT DETECTED NOT DETECTED Final   Streptococcus pneumoniae NOT DETECTED NOT DETECTED Final   Streptococcus pyogenes NOT DETECTED NOT DETECTED Final   Acinetobacter baumannii NOT DETECTED NOT DETECTED Final   Enterobacteriaceae species DETECTED (A) NOT DETECTED Final    Comment: Enterobacteriaceae represent a large family of gram-negative bacteria, not a single organism. CRITICAL RESULT CALLED TO, READ BACK BY AND VERIFIED WITH: PHRMD M TURNER @1311  03/31/2018 BY S GEZAHEGN    Enterobacter cloacae complex NOT DETECTED NOT DETECTED Final   Escherichia Hughes DETECTED (A) NOT DETECTED Final    Comment: CRITICAL RESULT CALLED TO, READ BACK BY AND VERIFIED WITH: PHRMD M TURNER @1311   03/31/2018 BY S GEZAHEGN    Klebsiella oxytoca NOT DETECTED NOT DETECTED Final   Klebsiella pneumoniae NOT DETECTED NOT DETECTED Final   Proteus species NOT DETECTED NOT DETECTED Final   Serratia marcescens NOT DETECTED NOT DETECTED Final   Carbapenem resistance NOT DETECTED NOT DETECTED Final   Haemophilus influenzae NOT DETECTED NOT DETECTED Final   Neisseria meningitidis NOT DETECTED NOT DETECTED Final   Pseudomonas aeruginosa NOT DETECTED NOT DETECTED Final   Candida albicans NOT DETECTED NOT DETECTED Final   Candida glabrata NOT DETECTED NOT DETECTED Final   Candida krusei NOT DETECTED NOT DETECTED Final   Candida parapsilosis NOT DETECTED NOT DETECTED Final   Candida tropicalis NOT DETECTED NOT DETECTED Final    Comment: Performed at [facility], [place] [address] [place], [place] [address]     Medications:   . enoxaparin (LOVENOX) injection  40 mg Subcutaneous Q24H  . folic acid  676 mcg Oral Daily  . hydrochlorothiazide  12.5 mg Oral Daily  . isosorbide mononitrate  60 mg Oral Daily  . nebivolol  10 mg Oral Daily  . oxyCODONE  5 mg Oral TID  . pantoprazole  40 mg Oral Daily  . potassium chloride  40 mEq Oral Daily  . rOPINIRole  2 mg Oral QHS  . rosuvastatin  20 mg Oral QPM  . sodium chloride flush  10-40 mL Intracatheter Q12H   Continuous Infusions: . fluconazole (DIFLUCAN) IV    . meropenem (MERREM) IV 2 g (04/05/18 1010)     LOS: 5 days   Charlynne Cousins  [facility]  04/05/2018, 10:34 AM

## 2018-04-05 NOTE — Progress Notes (Signed)
[facility] Progress Note  4 Days Post-Op  Subjective: CC: pain  Patient complaining of generalized abdominal pain. Complains of nausea that is similar to his chronic nausea but worse. Tolerating diet.   Objective: Vital signs in last 24 hours: Temp:  [98.9 F (37.2 C)-100.1 F (37.8 C)] 98.9 F (37.2 C) (03/17 0512) Pulse Rate:  [71-82] 76 (03/17 0512) Resp:  [16-17] 17 (03/16 2218) BP: (153-160)/(81-84) 156/81 (03/17 0512) SpO2:  [83 %-98 %] 83 % (03/17 0512) Last BM Date: 04/05/18  Intake/Output from previous day: 03/16 0701 - 03/17 0700 In: 332.9 [P.O.:120; IV Piggyback:212.9] Out: 1950 [Urine:1950] Intake/Output this shift: No intake/output data recorded.  PE: Gen:  Alert, NAD, pleasant Card:  Regular rate and rhythm, pedal pulses 2+ BL Pulm:  Normal effort, clear to auscultation bilaterally Abd: Soft, non-tender, non-distended, +BS Skin: warm and dry, no rashes  Psych: A&Ox3   Lab Results:  Recent Labs    04/04/18 0123  WBC 11.7*  HGB 9.7*  HCT 30.4*  PLT 130*   BMET Recent Labs    04/04/18 1140 04/05/18 0435  NA 140 141  K 3.7 4.5  CL 105 106  CO2 25 26  GLUCOSE 247* 132*  BUN 32* 32*  CREATININE 1.79* 1.80*  CALCIUM 8.4* 8.7*   PT/INR No results for input(s): LABPROT, INR in the last 72 hours. CMP     Component Value Date/Time   NA 141 04/05/2018 0435   NA 141 12/24/2017 0819   K 4.5 04/05/2018 0435   CL 106 04/05/2018 0435   CO2 26 04/05/2018 0435   GLUCOSE 132 (H) 04/05/2018 0435   BUN 32 (H) 04/05/2018 0435   BUN 25 12/24/2017 0819   CREATININE 1.80 (H) 04/05/2018 0435   CREATININE 1.17 12/26/2015 1034   CALCIUM 8.7 (L) 04/05/2018 0435   PROT 5.6 (L) 04/02/2018 0326   PROT 6.7 12/24/2017 0819   ALBUMIN 2.3 (L) 04/02/2018 0326   ALBUMIN 4.1 12/24/2017 0819   AST 49 (H) 04/02/2018 0326   ALT 22 04/02/2018 0326   ALKPHOS 47 04/02/2018 0326   BILITOT 0.6 04/02/2018 0326   BILITOT 0.5 12/24/2017 0819   GFRNONAA 36  (L) 04/05/2018 0435   GFRAA 42 (L) 04/05/2018 0435   Lipase     Component Value Date/Time   LIPASE 174 (H) 04/03/2018 0412       Studies/Results: Dg Chest Port 1 View  Result Date: 04/04/2018 CLINICAL DATA:  Flash pulmonary edema, bilat lower extremity swelling EXAM: PORTABLE CHEST 1 VIEW COMPARISON:  Chest CT, 04/04/2010.  Chest radiographs, 05/24/2009. FINDINGS: Cardiac silhouette is normal in size. There stable changes from previous CABG surgery. No mediastinal or hilar masses. No convincing adenopathy. Stable scarring in the left mid to lower lung with blunting of the left costophrenic sulci. Lungs otherwise clear. No pleural effusion or pneumothorax. Right PICC has its tip in the lower superior vena cava. Skeletal structures grossly intact. Stable changes from a previous anterior cervical spine fusion. IMPRESSION: 1. No acute cardiopulmonary disease. No radiographic evidence of pulmonary edema. Electronically Signed   By: Lajean Manes M.D.   On: 04/04/2018 01:05    Anti-infectives: Anti-infectives (From admission, onward)   Start     Dose/Rate Route Frequency Ordered Stop   04/05/18 1500  fluconazole (DIFLUCAN) IVPB 200 mg     200 mg 100 mL/hr over 60 Minutes Intravenous Every 24 hours 04/04/18 1650     04/03/18 2200  meropenem (MERREM) 2 g in sodium chloride 0.9 %  100 mL IVPB     2 g 200 mL/hr over 30 Minutes Intravenous Every 12 hours 04/03/18 1019     04/02/18 1000  meropenem (MERREM) 1 g in sodium chloride 0.9 % 100 mL IVPB  Status:  Discontinued     1 g 200 mL/hr over 30 Minutes Intravenous Every 12 hours 04/02/18 0949 04/03/18 1019   04/01/18 1500  fluconazole (DIFLUCAN) IVPB 100 mg  Status:  Discontinued     100 mg 50 mL/hr over 60 Minutes Intravenous Every 24 hours 04/01/18 1352 04/04/18 1650   03/31/18 2200  ceFEPIme (MAXIPIME) 2 g in sodium chloride 0.9 % 100 mL IVPB  Status:  Discontinued     2 g 200 mL/hr over 30 Minutes Intravenous Every 24 hours 03/31/18 0019  03/31/18 1436   03/31/18 2200  cefTRIAXone (ROCEPHIN) 2 g in sodium chloride 0.9 % 100 mL IVPB  Status:  Discontinued     2 g 200 mL/hr over 30 Minutes Intravenous Every 24 hours 03/31/18 1436 04/02/18 0947   03/31/18 1630  vancomycin (VANCOCIN) 1,500 mg in sodium chloride 0.9 % 500 mL IVPB     1,500 mg 250 mL/hr over 120 Minutes Intravenous  Once 03/31/18 1539 03/31/18 2044   03/31/18 0030  ceFEPIme (MAXIPIME) 2 g in sodium chloride 0.9 % 100 mL IVPB     2 g 200 mL/hr over 30 Minutes Intravenous  Once 03/31/18 0019 03/31/18 1401   03/31/18 0000  metroNIDAZOLE (FLAGYL) IVPB 500 mg  Status:  Discontinued     500 mg 100 mL/hr over 60 Minutes Intravenous Every 8 hours 03/30/18 2353 04/01/18 0853   03/30/18 2345  piperacillin-tazobactam (ZOSYN) IVPB 3.375 g  Status:  Discontinued     3.375 g 100 mL/hr over 30 Minutes Intravenous  Once 03/30/18 2334 03/30/18 2335   03/30/18 2200  cefTRIAXone (ROCEPHIN) 1 g in sodium chloride 0.9 % 100 mL IVPB     1 g 200 mL/hr over 30 Minutes Intravenous  Once 03/30/18 2155 03/31/18 0035       Assessment/Plan HTN HLD COPD CAD GERD/ Barrett's esophagus Hx of bladder cancer ESBL E.coli bacteremia   Biliary stricture s/p stenting - cytology still pending - Await cytology results prior to considering lap chole. The stricture is the likely etiology of the cholangitis/ bacteremia, not the sludge. He has an increased risk of periop morbidity/ mortality and we discussed this.  - Sludge noted on EUS, possible small stones in the gallbladder neck noted on MRCP  FEN: HH diet  VTE: SCDs, lovenox ID: meropenem 3/15>> diflucan 3/17>>  LOS: 5 days    Brigid Re , [facility] 04/05/2018, 7:45 AM Pager: [place]: [phone]

## 2018-04-06 ENCOUNTER — Other Ambulatory Visit: Payer: Self-pay | Admitting: Cardiovascular Disease

## 2018-04-06 ENCOUNTER — Inpatient Hospital Stay (HOSPITAL_COMMUNITY): Payer: Medicare Other

## 2018-04-06 ENCOUNTER — Other Ambulatory Visit: Payer: Self-pay | Admitting: Physical Medicine & Rehabilitation

## 2018-04-06 DIAGNOSIS — R609 Edema, unspecified: Secondary | ICD-10-CM

## 2018-04-06 DIAGNOSIS — K831 Obstruction of bile duct: Secondary | ICD-10-CM

## 2018-04-06 LAB — GLUCOSE, CAPILLARY
Glucose-Capillary: 127 mg/dL — ABNORMAL HIGH (ref 70–99)
Glucose-Capillary: 151 mg/dL — ABNORMAL HIGH (ref 70–99)
Glucose-Capillary: 157 mg/dL — ABNORMAL HIGH (ref 70–99)
Glucose-Capillary: 172 mg/dL — ABNORMAL HIGH (ref 70–99)

## 2018-04-06 LAB — BASIC METABOLIC PANEL
Anion gap: 4 — ABNORMAL LOW (ref 5–15)
BUN: 32 mg/dL — ABNORMAL HIGH (ref 8–23)
CO2: 27 mmol/L (ref 22–32)
CREATININE: 1.75 mg/dL — AB (ref 0.61–1.24)
Calcium: 7.7 mg/dL — ABNORMAL LOW (ref 8.9–10.3)
Chloride: 107 mmol/L (ref 98–111)
GFR calc Af Amer: 43 mL/min — ABNORMAL LOW (ref >60)
GFR calc non Af Amer: 37 mL/min — ABNORMAL LOW (ref >60)
Glucose, Bld: 175 mg/dL — ABNORMAL HIGH (ref 70–99)
Potassium: 4.1 mmol/L (ref 3.5–5.1)
Sodium: 138 mmol/L (ref 135–145)

## 2018-04-06 MED ORDER — FUROSEMIDE 40 MG PO TABS: 40.0000 mg | ORAL_TABLET | ORAL | 0 refills | Status: DC | PRN

## 2018-04-06 MED ORDER — HEPARIN SOD (PORK) LOCK FLUSH 100 UNIT/ML IV SOLN
250.0000 [IU] | INTRAVENOUS | Status: AC | PRN
  Administered 2018-04-06: 250 [IU]

## 2018-04-06 MED ORDER — ERTAPENEM IV (FOR PTA / DISCHARGE USE ONLY): 1.0000 g | INTRAVENOUS | 0 refills | Status: AC

## 2018-04-06 MED ORDER — FLUCONAZOLE 100 MG PO TABS: 100.0000 mg | ORAL_TABLET | Freq: Every day | ORAL | 0 refills | Status: AC

## 2018-04-06 MED ORDER — SODIUM CHLORIDE 0.9 % IV SOLN
1.0000 g | INTRAVENOUS | Status: DC
  Administered 2018-04-06: 1000 mg via INTRAVENOUS
  Filled 2018-04-06: qty 1

## 2018-04-06 NOTE — Discharge Summary (Signed)
Discharge Summary  [name] NIO:[id number] DOB: 06-Feb-1940  PCP: [name], Jacob Halsted, MD  Admit date: 03/30/2018 Discharge date: 04/06/2018  Time spent: 65mns, more than 50% time spent on coordination of care.  Recommendations for Outpatient Follow-up:  1. F/u with PCP within a week  for hospital discharge follow up, repeat cbc/bmp at follow up.  2. F/u with GI Dr MRush Landmark 3. F/u with General surgery Dr CKae Helleras needed 4. F/u with cardiology Dr KClaiborne Billingsfor h/o CAD, diastolic dysfunction  5. Home health arranged for home iv abx infusion, last dose on 3/28, can pull picc line once finish abx. Repeat surveillence blood culture after finish abx treatment  Discharge Diagnoses:  Active Hospital Problems   Diagnosis Date Noted   Sepsis ([facility] 03/31/2018   ESBL (extended spectrum beta-lactamase) producing bacteria infection 04/04/2018   Ascending cholangitis 04/01/2018   Diabetes mellitus type 2, noninsulin dependent ([facility] 03/31/2018   Pancreatitis 03/31/2018   Bacteremia due to Gram-negative bacteria 03/31/2018   Abdominal pain    Dilated bile duct    Acute biliary pancreatitis 03/30/2018   Leukocytosis 03/30/2018   CAD (coronary artery disease) 03/30/2018   CKD (chronic kidney disease), stage III ([facility] 06/16/2017   Essential hypertension 10/12/2006    Resolved Hospital Problems  No resolved problems to display.    Discharge Condition: stable  Diet recommendation: heart healthy/carb modified  Filed Weights   03/31/18 0000 03/31/18 1531  Weight: 84.4 kg 84 kg    History of present illness:  PCP: HIsaac Hughes ERayford Halsted MD   Patient coming from: Home   Chief Complaint: Abdominal pain, tremors   HPI: Jacob KNOLLis a 78y.o. male with medical history significant for coronary artery disease, bladder cancer, hypertension, Barrett esophagus, hiatal hernia, and GERD, now presenting to the emergency department for evaluation of severe  upper abdominal pain with nausea and shaking chills.  Patient reports that he has had chronic intermittent lower abdominal and groin pain, but for the past week has been experiencing some pain in his epigastrium, particularly after eating.  He has had nausea associated with this, but has not vomited.  He reports severe worsening in his symptoms over the past couple days with shaking chills.  Denies diarrhea, chest pain, or shortness of breath.  ED Course: Upon arrival to the ED, patient is found to be afebrile, saturating well on room air, and with vitals otherwise normal.  EKG features a sinus rhythm.  Chemistry panel is notable for creatinine 1.59, similar to priors, as well as mild elevation in AST and total bilirubin.  CBC is notable for leukocytosis to 21,400.  Lactic acid is elevated to 2.4.  Lipase is elevated to 390, troponin is normal, and UA notable for proteinuria.  Blood cultures were collected and the patient was treated with a liter of normal saline, morphine, Rocephin, and Zofran in the ED.  He remains hemodynamically stable and will be observed for ongoing evaluation and management.  Hospital Course:  Principal Problem:   Sepsis ([facility] Active Problems:   Essential hypertension   CKD (chronic kidney disease), stage III ([facility] [place]   Acute biliary pancreatitis   Leukocytosis   CAD (coronary artery disease)   Diabetes mellitus type 2, noninsulin dependent (HCC)   Pancreatitis   Bacteremia due to Gram-negative bacteria   Abdominal pain   Dilated bile duct   Ascending cholangitis   ESBL (extended spectrum beta-lactamase) producing bacteria infection  Sepsis ([facility] to ascending cholangitis/gram-negative bacteremia: Blood  cultures were positive for ESBL E. coli, change antibiotics to IV meropenem started is 04/02/2018. MRCP on 03/30/2018 showed acute pancreatitis, mild gallbladder distention with pericholecystic fluid and gallbladder wall thickening concerning for acute cholecystitis, he had  intrahepatic and extrahepatic bile duct dilation with a possible stricture no common bile duct identified GI was consulted recommended an ERCP on 04/01/2018 show stricture and sludge no stones, stent placed   2 weeks of antibiotics for his ESBL bacteremia end date 04/16/2018 Sepsis physiology has resolved.  Acute biliary pancreatitis: CT scan and MRCP showed acute biliary dilation of the near the ampulla. ERCP performed with sphincterotomy and stent placement.  Brushing of Common bile duct  showed atypical cells present. No ab pain, tolerating diet, he is cleared to discharge home with repeat ERCP in a few weeks.   Esophageal candidiasis: Changed to oral Diflucan, discharged on three more days of diflucan  Acute kidney injury on  CKD (chronic kidney disease), stage III ([place]): With a baseline creatinine around 1.3-1.6.   In  the setting of sepsis, NSAID use and IV contrast (03/30/2018) and possibly IV vancomycin. Give IV Lasix on 04/04/2018, for dyspnea which she has at home. Cr peaked at 2.27, cr 1.75 at discharge Patient is to follow up with pcp, repeat bmp  H/o HyPerkalemia on ARBs: Hypokalemia likely due to poor oral intake, corrected, normalized.  Essential hypertension: Stable on Bystolic, imdur .  History of esophagitis: Protonix oral.  Dyslipidemia: Hold simvastatin and Zetia.  Pre-op evaluation: 2D echo last year showed an EF of 60% with grade 2 diastolic heart failure. Patient has agreed to proceed with surgical intervention despite risk and benefits. Cardiology was consulted recommended no further testing.  Mild left lower extremity pitting edema: Venous US negative for DVT, prn lasix, elevate legs  DVT prophylaxis: lovenox Family Communication:daughter over the phone Disposition Plan/Barrier to D/C: patient is cleared to discharge home by surgery and gi Code Status: full    Procedures:  ERCP with biliary stent placement, EUS on 3/13 by GI Dr  Rush Landmark  Consultations:  GI  General surgery  Discharge Exam: BP (!) 123/57 (BP Location: Left Arm)    Pulse 79    Temp 98.2 F (36.8 C) (Oral)    Resp 17    Ht 5' 9" (1.753 m)    Wt 84 kg    SpO2 97%    BMI 27.35 kg/m   General: NAD, pleasant Cardiovascular: RRR Respiratory: CTABL Ab: soft, nontender Extremity: mild left lower extremity pitting edema  Discharge Instructions You were cared for by a hospitalist during your hospital stay. If you have any questions about your discharge medications or the care you received while you were in the hospital after you are discharged, you can call the unit and asked to speak with the hospitalist on call if the hospitalist that took care of you is not available. Once you are discharged, your primary care physician will handle any further medical issues. Please note that NO REFILLS for any discharge medications will be authorized once you are discharged, as it is imperative that you return to your primary care physician (or establish a relationship with a primary care physician if you do not have one) for your aftercare needs so that they can reassess your need for medications and monitor your lab values.  Discharge Instructions    Diet - low sodium heart healthy   Complete by:  As directed    Carb modified diet   Home infusion instructions [facility] May follow [facility] Dosing Protocol; May administer Cathflo as needed to maintain patency of vascular access device.; Flushing of vascular access device: per [facility] Protocol: 0.9% NaCl pre/post medica...   Complete by:  As directed °  ° Instructions:  May follow [facility] Dosing Protocol  ° Instructions:  May administer Cathflo as needed to maintain patency of vascular access device.  ° Instructions:  Flushing of vascular access device: per [facility] Protocol: 0.9% NaCl pre/post medication administration and prn patency; Heparin 100 u/ml, 5ml for implanted ports and Heparin 10u/ml, 5ml for all  other central venous catheters.  ° Instructions:  May follow [facility] Anaphylaxis Protocol for First Dose Administration in the home: 0.9% NaCl at 25-50 ml/hr to maintain IV access for protocol meds. Epinephrine 0.3 ml IV/IM PRN and Benadryl 25-50 IV/IM PRN s/s of anaphylaxis.  ° Instructions:  [facility] Infusion Coordinator (RN) to assist per patient IV care needs in the home PRN.  ° Increase activity slowly   Complete by:  As directed °  °  ° °Allergies as of 04/06/2018   °   Reactions  ° Losartan Potassium Other (See Comments)  ° Hyperkalemia  ° Tape Rash  ° Itching, reddened skin °Itching, reddened skin  °  °  °Medication List  °  °STOP taking these medications   °etodolac 300 MG capsule °Commonly known as:  LODINE °  °hydrochlorothiazide 12.5 MG capsule °Commonly known as:  MICROZIDE °  °  °TAKE these medications   °acetaminophen 325 MG tablet °Commonly known as:  TYLENOL °Take 650 mg by mouth every 6 (six) hours as needed for mild pain. °  °aspirin 81 MG tablet °Take 81 mg by mouth daily. °  °Bystolic 10 MG tablet °Generic drug:  nebivolol °TAKE ONE TABLET BY MOUTH ONCE DAILY °What changed:  how much to take °  °cyanocobalamin 1000 MCG/ML injection °Commonly known as:  (VITAMIN B-12) °INJECT 1 ML (CC) INTRAMUSCULARLY FOR 1 DOSE °What changed:   °· how much to take °· how to take this °· when to take this °· additional instructions °  °dicyclomine 10 MG capsule °Commonly known as:  BENTYL °Take 1 capsule (10 mg total) by mouth every 8 (eight) hours as needed for spasms. °  °ertapenem  IVPB °Commonly known as:  INVANZ °Inject 1 g into the vein daily for 10 days. Indication:  ESBL E. Coli Bacteremia °Last Day of Therapy:  04/16/2018 °Labs - Once weekly:  CBC/D and BMP, °Labs - Every other week:  ESR and CRP °  °ezetimibe 10 MG tablet °Commonly known as:  ZETIA °TAKE 1 TABLET BY MOUTH ONCE DAILY °  °fluconazole 100 MG tablet °Commonly known as:  DIFLUCAN °Take 1 tablet (100 mg total) by mouth daily for 3  days. °Start taking on:  April 07, 2018 °  °folic acid 400 MCG tablet °Commonly known as:  FOLVITE °Take 400 mcg by mouth daily. °  °furosemide 40 MG tablet °Commonly known as:  Lasix °Take 1 tablet (40 mg total) by mouth every other day as needed for up to 30 days for fluid or edema. °  °gabapentin 600 MG tablet °Commonly known as:  Neurontin °Take 1 tablet (600 mg total) by mouth 3 (three) times daily. °  °isosorbide mononitrate 60 MG 24 hr tablet °Commonly known as:  IMDUR °TAKE 1 TABLET BY MOUTH ONCE DAILY °  °NEEDLE (DISP) 25 G 25G X 1" Misc °Commonly known as:  B-D DISP NEEDLE 25GX1" °Inject 1000   mcg into muscle once a month. °  °nitroGLYCERIN 0.4 MG SL tablet °Commonly known as:  NITROSTAT °Place 1 tablet (0.4 mg total) under the tongue every 5 (five) minutes as needed for chest pain. °  °omeprazole 40 MG capsule °Commonly known as:  PRILOSEC °Take 1 capsule (40 mg total) by mouth daily. °  °oxyCODONE-acetaminophen 10-325 MG tablet °Commonly known as:  PERCOCET °Take 1 tablet by mouth 2 (two) times daily as needed. °  °PROBIOTIC ADVANCED PO °Take 1 tablet by mouth daily. °  °rOPINIRole 2 MG tablet °Commonly known as:  REQUIP °TAKE 1 TABLET BY MOUTH AT BEDTIME °  °rosuvastatin 20 MG tablet °Commonly known as:  CRESTOR °TAKE 1 TABLET BY MOUTH ONCE DAILY °  °venlafaxine 75 MG tablet °Commonly known as:  EFFEXOR °Take 1 tablet (75 mg total) by mouth daily. °  °  °  °  ° °  °Home Infusion Instuctions  °(From admission, onward)  °  ° °  °  Start     Ordered  ° 04/06/18 0000  Home infusion instructions [facility] May follow [facility] Dosing [facility]; May administer Cathflo as needed to maintain patency of vascular access device.; Flushing of vascular access device: per [facility] Protocol: 0.9% NaCl pre/post medica...    °Question Answer Comment  °Instructions May follow [facility] Dosing Protocol   °Instructions May administer Cathflo as needed to maintain patency of vascular access device.   °Instructions  Flushing of vascular access device: per [facility] Protocol: 0.9% NaCl pre/post medication administration and prn patency; Heparin 100 u/ml, 5ml for implanted ports and Heparin 10u/ml, 5ml for all other central venous catheters.   °Instructions May follow [facility] Anaphylaxis Protocol for First Dose Administration in the home: 0.9% NaCl at 25-50 ml/hr to maintain IV access for protocol meds. Epinephrine 0.3 ml IV/IM PRN and Benadryl 25-50 IV/IM PRN s/s of anaphylaxis.   °Instructions [facility] Infusion Coordinator (RN) to assist per patient IV care needs in the home PRN.   °  ° 04/06/18 1126  °  °  °  ° °Allergies  °Allergen Reactions  °• Losartan Potassium Other (See Comments)  °  Hyperkalemia  °• Tape Rash  °  Itching, reddened skin °Itching, reddened skin  ° °Follow-up Information   ° [facility] Follow up.   °Why:  [phone]  ° °  °  ° Hernandez Acosta, Estela Y, MD Follow up in 1 week(s).   °Specialty:  Internal Medicine °Why:  hospital discharge follow up. repeat cbc/bmp at follow up. °Contact information: °[address] °[place] [address] °[phone] ° ° °  °  ° Kelly, Thomas A, MD Follow up.   °Specialty:  Cardiology °Why:  for CAD and diastolic heart dysfunction  °Contact information: °[address] °[address] °[place] [address] [phone] ° ° °  °  ° Mansouraty, Gabriel Jr., MD Follow up in 3 week(s).   °Specialties:  Gastroenterology, Internal Medicine °Why:  for biliary stricture  °Contact information: °[address] °[place] [address] °[phone] ° ° °  °  ° Connor, Chelsea A, MD Follow up.   °Specialty:  General Surgery °Why:  as needed  °Contact information: °[address] °[address] °[place] [address] °[phone] ° ° °  °  °  ° ° ° °The results of significant diagnostics from this hospitalization (including imaging, microbiology, ancillary and laboratory) are listed below for reference.   ° °Significant Diagnostic Studies: °Ct   Abdomen Pelvis W  Contrast ° °Result Date: 03/30/2018 °CLINICAL DATA:  77 y/o  M; generalized abdominal pain and tremors. EXAM: CT ABDOMEN AND PELVIS WITH CONTRAST TECHNIQUE: Multidetector CT imaging of the abdomen and pelvis was performed using the standard protocol following bolus administration of intravenous contrast. CONTRAST:  80mL OMNIPAQUE IOHEXOL 300 MG/ML  SOLN COMPARISON:  01/05/2018 CT abdomen and pelvis FINDINGS: Lower chest: No acute abnormality. Hepatobiliary: No focal liver abnormality. No gallbladder wall thickening or radiopaque gallstone identified within the gallbladder. Increased dilatation of the common bile duct measuring up to 15 mm at the liver hilum. The common bile duct near the ampulla is opacified (series 3, image 33 and series 6, image 66). Pancreas: Edema surrounding the pancreas compatible with acute pancreatitis. No findings of necrosis or acute peripancreatic collection at this time. No main duct dilatation. Spleen: Normal in size without focal abnormality. Adrenals/Urinary Tract: Adrenal glands are unremarkable. Kidneys are normal, without renal calculi, focal lesion, or hydronephrosis. Bladder is unremarkable. Stomach/Bowel: Stomach is within normal limits. Appendix not identified, no pericecal inflammation. No evidence of bowel wall thickening, distention, or inflammatory changes. Vascular/Lymphatic: Aortic atherosclerosis. No enlarged abdominal or pelvic lymph nodes. Reproductive: Prostate is unremarkable. Other: No abdominal wall hernia or abnormality. No abdominopelvic ascites. Musculoskeletal: No fracture is seen. IMPRESSION: 1. Findings compatible with acute pancreatitis. No necrosis or acute peripancreatic collection identified at this time. 2. Increased dilatation of the common bile duct measuring up to 15 mm at the liver hilum and opacified common bile duct near the ampulla. No main duct dilatation of the pancreas. Findings may represent stricture, obstructing mass, or choledocholithiasis.  This can be further evaluated with MRI/MRCP without and with contrast. Electronically Signed   By: Lance  Furusawa-Stratton M.D.   On: 03/30/2018 23:08  ° °Mr 3d Recon At Scanner ° °Result Date: 03/31/2018 °CLINICAL DATA:  Acute pancreatitis. EXAM: MRI ABDOMEN WITHOUT AND WITH CONTRAST (INCLUDING MRCP) TECHNIQUE: Multiplanar multisequence MR imaging of the abdomen was performed both before and after the administration of intravenous contrast. Heavily T2-weighted images of the biliary and pancreatic ducts were obtained, and three-dimensional MRCP images were rendered by post processing. CONTRAST:  8.4 cc Gadavist COMPARISON:  CT scan 03/30/2018 FINDINGS: Lower chest: The lung bases are clear of an acute process. The heart is normal in size. No pericardial effusion. Hepatobiliary: No focal hepatic lesions. Mild stable intrahepatic and extrahepatic biliary dilatation when compared to CT scan from 2019. The gallbladder is distended. No significant gallbladder wall thickening but there is mild pericholecystic fluid. Small gallstones suspected in the gallbladder neck. Intra and extrahepatic biliary dilatation. No common bile duct stones are identified. No ampullary mass or pancreatic head mass. There could be a distal stricture. This appears to be chronic. Pancreas: Mild peripancreatic inflammatory changes with a small amount of fluid in the anterior pararenal spaces. Normal pancreatic enhancement and normal caliber and course of the main pancreatic duct. Findings consistent with mild uncomplicated pancreatitis as seen on the CT scan. Spleen:  Normal size.  No focal lesions. Adrenals/Urinary Tract: The adrenal glands and kidneys are unremarkable. Stomach/Bowel: The gastric wall in the body and antral regions appears somewhat thickened and edematous. There is also moderate enhancement. Could not exclude peptic ulcer disease/gastritis. The visualized small bowel and colon are unremarkable. Vascular/Lymphatic: No  pathologically enlarged lymph nodes identified. No abdominal aortic aneurysm demonstrated. Small scattered nodes, likely inflammatory. Other:  Small amount of free fluid but no overt ascites. Musculoskeletal: No significant findings. IMPRESSION: 1. MR findings   consistent with mild acute uncomplicated pancreatitis. 2. Mild gallbladder distension and mild pericholecystic fluid but no gallbladder wall thickening or other definite findings for acute cholecystitis. 3. Stable appearing intra and extrahepatic biliary dilatation. Possible distal stricture. No common bile duct stones are identified. Normal main pancreatic duct. 4. Possible inflammatory changes involving the stomach which could suggest peptic ulcer disease or gastritis. Electronically Signed   By: P.  Gallerani M.D.   On: 03/31/2018 08:43  ° °Dg Chest Port 1 View ° °Result Date: 04/04/2018 °CLINICAL DATA:  Flash pulmonary edema, bilat lower extremity swelling EXAM: PORTABLE CHEST 1 VIEW COMPARISON:  Chest CT, 04/04/2010.  Chest radiographs, 05/24/2009. FINDINGS: Cardiac silhouette is normal in size. There stable changes from previous CABG surgery. No mediastinal or hilar masses. No convincing adenopathy. Stable scarring in the left mid to lower lung with blunting of the left costophrenic sulci. Lungs otherwise clear. No pleural effusion or pneumothorax. Right PICC has its tip in the lower superior vena cava. Skeletal structures grossly intact. Stable changes from a previous anterior cervical spine fusion. IMPRESSION: 1. No acute cardiopulmonary disease. No radiographic evidence of pulmonary edema. Electronically Signed   By: David  Ormond M.D.   On: 04/04/2018 01:05  ° °Dg Ercp Biliary & Pancreatic Ducts ° °Result Date: 04/01/2018 °CLINICAL DATA:  77-year-old male with a history of cholelithiasis EXAM: ERCP TECHNIQUE: Multiple spot images obtained with the fluoroscopic device and submitted for interpretation post-procedure. FLUOROSCOPY TIME:  Fluoroscopy Time:   2 minutes 43 seconds COMPARISON:  MR 03/31/2018 FINDINGS: Limited intraoperative images of ERCP. Initial image demonstrates endoscope projecting over the upper abdomen none, with safety wire in position of partial opacification of the extrahepatic biliary ducts. Subsequently there is retrieval balloon deployed and then placement of plastic biliary stent. IMPRESSION: Limited images during ERCP demonstrates partial opacification of the extrahepatic biliary ducts, deployment of a retrieval balloon, and placement of plastic biliary stent Please refer to the dictated operative report for full details of intraoperative findings and procedure. Electronically Signed   By: Jaime  Wagner D.O.   On: 04/01/2018 12:39  ° °Mr Abdomen Mrcp W Wo Contast ° °Result Date: 03/31/2018 °CLINICAL DATA:  Acute pancreatitis. EXAM: MRI ABDOMEN WITHOUT AND WITH CONTRAST (INCLUDING MRCP) TECHNIQUE: Multiplanar multisequence MR imaging of the abdomen was performed both before and after the administration of intravenous contrast. Heavily T2-weighted images of the biliary and pancreatic ducts were obtained, and three-dimensional MRCP images were rendered by post processing. CONTRAST:  8.4 cc Gadavist COMPARISON:  CT scan 03/30/2018 FINDINGS: Lower chest: The lung bases are clear of an acute process. The heart is normal in size. No pericardial effusion. Hepatobiliary: No focal hepatic lesions. Mild stable intrahepatic and extrahepatic biliary dilatation when compared to CT scan from 2019. The gallbladder is distended. No significant gallbladder wall thickening but there is mild pericholecystic fluid. Small gallstones suspected in the gallbladder neck. Intra and extrahepatic biliary dilatation. No common bile duct stones are identified. No ampullary mass or pancreatic head mass. There could be a distal stricture. This appears to be chronic. Pancreas: Mild peripancreatic inflammatory changes with a small amount of fluid in the anterior pararenal  spaces. Normal pancreatic enhancement and normal caliber and course of the main pancreatic duct. Findings consistent with mild uncomplicated pancreatitis as seen on the CT scan. Spleen:  Normal size.  No focal lesions. Adrenals/Urinary Tract: The adrenal glands and kidneys are unremarkable. Stomach/Bowel: The gastric wall in the body and antral regions appears somewhat thickened and edematous. There is also   also moderate enhancement. Could not exclude peptic ulcer disease/gastritis. The visualized small bowel and colon are unremarkable. Vascular/Lymphatic: No pathologically enlarged lymph nodes identified. No abdominal aortic aneurysm demonstrated. Small scattered nodes, likely inflammatory. Other:  Small amount of free fluid but no overt ascites. Musculoskeletal: No significant findings. IMPRESSION: 1. MR findings consistent with mild acute uncomplicated pancreatitis. 2. Mild gallbladder distension and mild pericholecystic fluid but no gallbladder wall thickening or other definite findings for acute cholecystitis. 3. Stable appearing intra and extrahepatic biliary dilatation. Possible distal stricture. No common bile duct stones are identified. Normal main pancreatic duct. 4. Possible inflammatory changes involving the stomach which could suggest peptic ulcer disease or gastritis. Electronically Signed   By: Marijo Sanes M.D.   On: 03/31/2018 08:43   [place] Ekg Site Rite  Result Date: 04/02/2018 If Site Rite image not attached, placement could not be confirmed due to current cardiac rhythm.   Microbiology: Recent Results (from the past 240 hour(s))  Culture, blood (routine x 2)     Status: Abnormal   Collection Time: 03/30/18 10:12 PM  Result Value Ref Range Status   Specimen Description BLOOD RIGHT FOREARM  Final   Special Requests   Final    BOTTLES DRAWN AEROBIC AND ANAEROBIC Blood Culture adequate volume   Culture  Setup Time   Final    GRAM NEGATIVE RODS CRITICAL RESULT CALLED TO, READ BACK BY AND  VERIFIED WITH: PHRMD M TURNER _0  03/31/2018 BY S GEZAHEGN Performed at [facility], [address] [place], [place] [address]    Culture (A)  Final    ESCHERICHIA COLI Confirmed Extended Spectrum Beta-Lactamase Producer (ESBL).  In bloodstream infections from ESBL organisms, carbapenems are preferred over piperacillin/tazobactam. They are shown to have a lower risk of mortality.    Report Status 04/02/2018 FINAL  Final   Organism ID, Bacteria ESCHERICHIA COLI  Final      Susceptibility   Escherichia coli - MIC*    AMPICILLIN >=32 RESISTANT Resistant     CEFAZOLIN >=64 RESISTANT Resistant     CEFEPIME RESISTANT Resistant     CEFTAZIDIME RESISTANT Resistant     CEFTRIAXONE >=64 RESISTANT Resistant     CIPROFLOXACIN >=4 RESISTANT Resistant     GENTAMICIN <=1 SENSITIVE Sensitive     IMIPENEM <=0.25 SENSITIVE Sensitive     TRIMETH/SULFA <=20 SENSITIVE Sensitive     AMPICILLIN/SULBACTAM >=32 RESISTANT Resistant     PIP/TAZO 8 SENSITIVE Sensitive     Extended ESBL POSITIVE Resistant     * ESCHERICHIA COLI  Culture, blood (routine x 2)     Status: Abnormal   Collection Time: 03/30/18 10:12 PM  Result Value Ref Range Status   Specimen Description BLOOD RIGHT HAND  Final   Special Requests   Final    BOTTLES DRAWN AEROBIC AND ANAEROBIC Blood Culture adequate volume   Culture  Setup Time   Final    GRAM NEGATIVE RODS AEROBIC BOTTLE ONLY CRITICAL VALUE NOTED.  VALUE IS CONSISTENT WITH PREVIOUSLY REPORTED AND CALLED VALUE. Performed at [facility], [place] [address], [place], [place] [address]    Culture ESCHERICHIA COLI (A)  Final   Report Status 04/02/2018 FINAL  Final  Blood Culture ID Panel (Reflexed)     Status: Abnormal   Collection Time: 03/30/18 10:12 PM  Result Value Ref Range Status   Enterococcus species NOT DETECTED NOT DETECTED Final   Listeria monocytogenes NOT DETECTED NOT DETECTED Final   Staphylococcus species NOT DETECTED NOT  Final  °  Staphylococcus aureus (BCID) NOT DETECTED NOT DETECTED Final  ° Streptococcus species NOT DETECTED NOT DETECTED Final  ° Streptococcus agalactiae NOT DETECTED NOT DETECTED Final  ° Streptococcus pneumoniae NOT DETECTED NOT DETECTED Final  ° Streptococcus pyogenes NOT DETECTED NOT DETECTED Final  ° Acinetobacter baumannii NOT DETECTED NOT DETECTED Final  ° Enterobacteriaceae species DETECTED (A) NOT DETECTED Final  °  Comment: Enterobacteriaceae represent a large family of gram-negative bacteria, not a single organism. °CRITICAL RESULT CALLED TO, READ BACK BY AND VERIFIED WITH: °PHRMD M TURNER @1311 03/31/2018 BY S GEZAHEGN °  ° Enterobacter cloacae complex NOT DETECTED NOT DETECTED Final  ° Escherichia coli DETECTED (A) NOT DETECTED Final  °  Comment: CRITICAL RESULT CALLED TO, READ BACK BY AND VERIFIED WITH: °PHRMD M TURNER @1311 03/31/2018 BY S GEZAHEGN °  ° Klebsiella oxytoca NOT DETECTED NOT DETECTED Final  ° Klebsiella pneumoniae NOT DETECTED NOT DETECTED Final  ° Proteus species NOT DETECTED NOT DETECTED Final  ° Serratia marcescens NOT DETECTED NOT DETECTED Final  ° Carbapenem resistance NOT DETECTED NOT DETECTED Final  ° Haemophilus influenzae NOT DETECTED NOT DETECTED Final  ° Neisseria meningitidis NOT DETECTED NOT DETECTED Final  ° Pseudomonas aeruginosa NOT DETECTED NOT DETECTED Final  ° Candida albicans NOT DETECTED NOT DETECTED Final  ° Candida glabrata NOT DETECTED NOT DETECTED Final  ° Candida krusei NOT DETECTED NOT DETECTED Final  ° Candida parapsilosis NOT DETECTED NOT DETECTED Final  ° Candida tropicalis NOT DETECTED NOT DETECTED Final  °  Comment: Performed at [facility], [address] [place], [place] [address]  °  ° °Labs: °Basic Metabolic Panel: °Recent Labs  °Lab 04/04/18 °0123 04/04/18 °0349 04/04/18 °1140 04/05/18 °0435 04/06/18 °0429  °NA 140 141 140 141 138  °K 4.1 4.0 3.7 4.5 4.1  °CL 112* 110 105 106 107  °CO2 25 23 25 26 27  °GLUCOSE 146* 207* 247* 132* 175*  °BUN 32* 31* 32*  32* 32*  °CREATININE 1.68* 1.74* 1.79* 1.80* 1.75*  °CALCIUM 8.0* 8.5* 8.4* 8.7* 7.7*  °MG 1.2*  --   --   --   --   ° °Liver Function Tests: °Recent Labs  °Lab 03/30/18 °1616 03/31/18 °0508 04/01/18 °0406 04/02/18 °0326  °AST 70* 43* 58* 49*  °ALT 32 26 24 22  °ALKPHOS 90 65 61 47  °BILITOT 1.5* 2.1* 1.1 0.6  °PROT 7.6 6.1* 6.1* 5.6*  °ALBUMIN 3.8 2.9* 2.7* 2.3*  ° °Recent Labs  °Lab 03/30/18 °1616 03/31/18 °0508 04/03/18 °0412  °LIPASE 390* 1,793* 174*  ° °No results for input(s): AMMONIA in the last 168 hours. °CBC: °Recent Labs  °Lab 03/30/18 °1616 03/31/18 °0508 04/01/18 °0754 04/04/18 °0123  °WBC 21.4* 12.6* 11.4* 11.7*  °NEUTROABS  --  11.3* 10.1* 9.0*  °HGB 13.2 11.7* 9.6* 9.7*  °HCT 41.5 36.0* 31.1* 30.4*  °MCV 87.7 87.4 89.1 89.7  °PLT 186 PLATELET CLUMPS NOTED ON SMEAR, UNABLE TO ESTIMATE PLATELET CLUMPS NOTED ON SMEAR, UNABLE TO ESTIMATE 130*  ° °Cardiac Enzymes: °No results for input(s): CKTOTAL, CKMB, CKMBINDEX, TROPONINI in the last 168 hours. °BNP: °BNP (last 3 results) °No results for input(s): BNP in the last 8760 hours. ° °ProBNP (last 3 results) °No results for input(s): PROBNP in the last 8760 hours. ° °CBG: °Recent Labs  °Lab 04/05/18 °1611 04/05/18 °2024 04/06/18 °0000 04/06/18 °0417 04/06/18 °0817  °GLUCAP 134* 124* 127* 151* 157*  ° ° ° ° ° °Signed: ° °Fang Xu MD, PhD  °  [facility] °04/06/2018, 11:50 AM ° ° ° °

## 2018-04-06 NOTE — Progress Notes (Signed)
Lower extremity venous has been completed.   Preliminary results in CV Proc.   Abram Sander 04/06/2018 1:33 PM

## 2018-04-06 NOTE — Progress Notes (Signed)
PHARMACY CONSULT NOTE FOR:  OUTPATIENT  PARENTERAL ANTIBIOTIC THERAPY (OPAT)  Indication: ESBL E. Coli Bacteremia Regimen: Ertapenem 1 gm every 24 hours End date:  04/16/2018  Discussed with Dr. Erlinda Hong and will switch to ertapenem at home for ease of administration.  IV antibiotic discharge orders are pended. To discharging provider:  please sign these orders via discharge navigator,  Select New Orders & click on the button choice - Manage This Unsigned Work.     Thank you for allowing pharmacy to be a part of this patient's care.  Jimmy Footman, PharmD, BCPS, [facility] Clinical Pharmacist Phone: [phone] 04/06/2018, 11:09 AM

## 2018-04-06 NOTE — Progress Notes (Signed)
Pt discharged home in stable condition after going over discharge teaching with no concerns voiced. AVS and prescription given before leaving unit

## 2018-04-06 NOTE — TOC Transition Note (Signed)
Transition of Care [facility]) - CM/SW Discharge Note   Patient Details  Name: [name] MRN: [id number] Date of Birth: 01/06/1941  [facility] CM/SW Contact:  Marilu Favre, RN Phone Number: 04/06/2018, 11:42 AM   Clinical Narrative:     Patient for discharge today.   Pam with [facility] aware. Dan with [facility] aware  Patient will receive dose of Invanz here at hospital prior to discharge. Pam will connect with patient and daughter [name].  Final next level of care: [place] Barriers to Discharge: No Barriers Identified   Patient Goals and CMS Choice Patient states their goals for this hospitalization and ongoing recovery are:: to go home  CMS Medicare.gov Compare Post Acute Care list provided to:: Patient Choice offered to / list presented to : Patient  Discharge Placement                       Discharge Plan and Services Discharge Planning Services: CM Consult Post Acute Care Choice: Home Health              HH Arranged: RN     Social Determinants of Health (SDOH) Interventions     Readmission Risk Interventions No flowsheet data found.

## 2018-04-06 NOTE — Progress Notes (Signed)
[facility] Progress Note  5 Days Post-Op  Subjective: CC-  Feels the same as yesterday. Main complaints are back and bilateral knee pain. Continues to have some epigastric pain. Ate 100% of his breakfast. Some nausea (chronic), no emesis. States that he had a large BM this morning.   Objective: Vital signs in last 24 hours: Temp:  [98.2 F (36.8 C)-100 F (37.8 C)] 98.2 F (36.8 C) (03/18 0430) Pulse Rate:  [78-82] 79 (03/18 0430) Resp:  [17-18] 17 (03/18 0430) BP: (123-147)/(57-85) 123/57 (03/18 0430) SpO2:  [97 %-100 %] 97 % (03/18 0430) Last BM Date: 04/05/18  Intake/Output from previous day: 03/17 0701 - 03/18 0700 In: 1300 [P.O.:1200; IV Piggyback:100] Out: 2100 [Urine:2100] Intake/Output this shift: Total I/O In: -  Out: 525 [Urine:525]  PE: Gen:  Alert, NAD, pleasant HEENT: EOM's intact, pupils equal and round Card:  RRR, 2+ DP pulses Pulm:  CTAB, no W/R/R, effort normal Abd: Soft, NT/ND, +BS, no HSM Skin: no rashes noted, warm and dry  Lab Results:  Recent Labs    04/04/18 0123  WBC 11.7*  HGB 9.7*  HCT 30.4*  PLT 130*   BMET Recent Labs    04/05/18 0435 04/06/18 0429  NA 141 138  K 4.5 4.1  CL 106 107  CO2 26 27  GLUCOSE 132* 175*  BUN 32* 32*  CREATININE 1.80* 1.75*  CALCIUM 8.7* 7.7*   PT/INR No results for input(s): LABPROT, INR in the last 72 hours. CMP     Component Value Date/Time   NA 138 04/06/2018 0429   NA 141 12/24/2017 0819   K 4.1 04/06/2018 0429   CL 107 04/06/2018 0429   CO2 27 04/06/2018 0429   GLUCOSE 175 (H) 04/06/2018 0429   BUN 32 (H) 04/06/2018 0429   BUN 25 12/24/2017 0819   CREATININE 1.75 (H) 04/06/2018 0429   CREATININE 1.17 12/26/2015 1034   CALCIUM 7.7 (L) 04/06/2018 0429   PROT 5.6 (L) 04/02/2018 0326   PROT 6.7 12/24/2017 0819   ALBUMIN 2.3 (L) 04/02/2018 0326   ALBUMIN 4.1 12/24/2017 0819   AST 49 (H) 04/02/2018 0326   ALT 22 04/02/2018 0326   ALKPHOS 47 04/02/2018 0326   BILITOT  0.6 04/02/2018 0326   BILITOT 0.5 12/24/2017 0819   GFRNONAA 37 (L) 04/06/2018 0429   GFRAA 43 (L) 04/06/2018 0429   Lipase     Component Value Date/Time   LIPASE 174 (H) 04/03/2018 0412       Studies/Results: No results found.  Anti-infectives: Anti-infectives (From admission, onward)   Start     Dose/Rate Route Frequency Ordered Stop   04/05/18 1500  fluconazole (DIFLUCAN) IVPB 200 mg  Status:  Discontinued     200 mg 100 mL/hr over 60 Minutes Intravenous Every 24 hours 04/04/18 1650 04/05/18 1042   04/05/18 1045  fluconazole (DIFLUCAN) tablet 100 mg     100 mg Oral Daily 04/05/18 1042 04/09/18 0959   04/03/18 2200  meropenem (MERREM) 2 g in sodium chloride 0.9 % 100 mL IVPB     2 g 200 mL/hr over 30 Minutes Intravenous Every 12 hours 04/03/18 1019     04/02/18 1000  meropenem (MERREM) 1 g in sodium chloride 0.9 % 100 mL IVPB  Status:  Discontinued     1 g 200 mL/hr over 30 Minutes Intravenous Every 12 hours 04/02/18 0949 04/03/18 1019   04/01/18 1500  fluconazole (DIFLUCAN) IVPB 100 mg  Status:  Discontinued  100 mg 50 mL/hr over 60 Minutes Intravenous Every 24 hours 04/01/18 1352 04/04/18 1650   03/31/18 2200  ceFEPIme (MAXIPIME) 2 g in sodium chloride 0.9 % 100 mL IVPB  Status:  Discontinued     2 g 200 mL/hr over 30 Minutes Intravenous Every 24 hours 03/31/18 0019 03/31/18 1436   03/31/18 2200  cefTRIAXone (ROCEPHIN) 2 g in sodium chloride 0.9 % 100 mL IVPB  Status:  Discontinued     2 g 200 mL/hr over 30 Minutes Intravenous Every 24 hours 03/31/18 1436 04/02/18 0947   03/31/18 1630  vancomycin (VANCOCIN) 1,500 mg in sodium chloride 0.9 % 500 mL IVPB     1,500 mg 250 mL/hr over 120 Minutes Intravenous  Once 03/31/18 1539 03/31/18 2044   03/31/18 0030  ceFEPIme (MAXIPIME) 2 g in sodium chloride 0.9 % 100 mL IVPB     2 g 200 mL/hr over 30 Minutes Intravenous  Once 03/31/18 0019 03/31/18 1401   03/31/18 0000  metroNIDAZOLE (FLAGYL) IVPB 500 mg  Status:   Discontinued     500 mg 100 mL/hr over 60 Minutes Intravenous Every 8 hours 03/30/18 2353 04/01/18 0853   03/30/18 2345  piperacillin-tazobactam (ZOSYN) IVPB 3.375 g  Status:  Discontinued     3.375 g 100 mL/hr over 30 Minutes Intravenous  Once 03/30/18 2334 03/30/18 2335   03/30/18 2200  cefTRIAXone (ROCEPHIN) 1 g in sodium chloride 0.9 % 100 mL IVPB     1 g 200 mL/hr over 30 Minutes Intravenous  Once 03/30/18 2155 03/31/18 0035       Assessment/Plan HTN HLD COPD CAD GERD/ Barrett's esophagus Hx of bladder cancer ESBL E.coli bacteremia   Biliary stricture s/p stenting - s/p ERCP and EUS 3/13 with plastic biliary stent placed into common bile duct >> cytology: ATYPICAL CELLS PRESENT >> pathology pending - Sludge noted on EUS, possible small stones in the gallbladder neck noted on MRCP - The stricture is the likely etiology of the cholangitis/ bacteremia, not the sludge. He has an increased risk of periop morbidity/ mortalityand we discussed this.  FEN: HH diet  VTE: SCDs, lovenox ID: meropenem 3/15>>,  diflucan 3/13>>3/17 Foley: none  Plan: Patient does not seem to have cholecystitis, he is tolerating a diet and has no abdominal tenderness on exam. VSS. Spoke with Dr. Rush Landmark of GI, he is considering repeat imaging and ERCP in 3-4 weeks. Patient could be discharged when medically stable with GI follow up and referred to surgery as needed once workup complete.   LOS: 6 days    Wellington Hampshire , [facility] 04/06/2018, 8:57 AM Pager: [phone] Mon-Thurs 7:00 am-4:30 pm Fri 7:00 am -11:30 AM Sat-Sun 7:00 am-11:30 am

## 2018-04-06 NOTE — Progress Notes (Signed)
Physical Therapy Treatment Patient Details Name: [name] MRN: [id number] DOB: 06-17-1940 Today's Date: 04/06/2018    History of Present Illness [name] is a 78 y.o. male with medical history significant for coronary artery disease, bladder cancer, hypertension, Barrett esophagus, hiatal hernia, and GERD,  presented to the emergency department for evaluation of severe upper abdominal pain with nausea and shaking chills. Acute biliary pancreatitis with dilated common bile duct ERCP with sphincterotomy and stent stent placement    PT Comments    Patient is making good progress with PT.  From a mobility standpoint anticipate patient will be ready for DC home when medically ready.    Follow Up Recommendations  No PT follow up;Other (comment);Supervision/Assistance - 24 hour(HHRN for IV antibiotics)     Equipment Recommendations  None recommended by PT    Recommendations for Other Services       Precautions / Restrictions Precautions Precautions: Other (comment) Precaution Comments: Contact Precautions Restrictions Weight Bearing Restrictions: No    Mobility  Bed Mobility                  Transfers Overall transfer level: Independent Equipment used: None Transfers: Sit to/from Stand              Ambulation/Gait Ambulation/Gait assistance: Scientist, forensic (Feet): 400 Feet Assistive device: None Gait Pattern/deviations: Step-through pattern(lateral sway) Gait velocity: decreased   General Gait Details: unsteady but no overt LOB with higher level balance activities   Stairs Stairs: Yes Stairs assistance: Min guard Stair Management: No rails;Step to pattern;Forwards Number of Stairs: 4 General stair comments: a little more unsteady with descending but again no LOB and no use of rails as pt does not have rails at home   Wheelchair Mobility    Modified Rankin (Stroke Patients Only)       Balance Overall balance assessment:  Mild deficits observed, not formally tested                                          Cognition Arousal/Alertness: Awake/alert Behavior During Therapy: WFL for tasks assessed/performed Overall Cognitive Status: Within Functional Limits for tasks assessed                                        Exercises      General Comments General comments (skin integrity, edema, etc.): daughter present      Pertinent Vitals/Pain Pain Assessment: Faces Faces Pain Scale: Hurts a little bit Pain Location: back and abdomen Pain Descriptors / Indicators: Guarding;Discomfort;Sharp Pain Intervention(s): Limited activity within patient's tolerance;Monitored during session;Repositioned    Home Living                      Prior Function            PT Goals (current goals can now be found in the care plan section) Acute Rehab PT Goals Patient Stated Goal: be able to go to his home Progress towards PT goals: Progressing toward goals    Frequency    Min 3X/week      PT Plan Current plan remains appropriate    Co-evaluation              AM-PAC PT "6 Clicks" Mobility   Outcome Measure  Help  needed turning from your back to your side while in a flat bed without using bedrails?: None Help needed moving from lying on your back to sitting on the side of a flat bed without using bedrails?: None Help needed moving to and from a bed to a chair (including a wheelchair)?: None Help needed standing up from a chair using your arms (e.g., wheelchair or bedside chair)?: None Help needed to walk in hospital room?: None Help needed climbing 3-5 steps with a railing? : A Little 6 Click Score: 23    End of Session Equipment Utilized During Treatment: Gait belt Activity Tolerance: Patient tolerated treatment well Patient left: with call bell/phone within reach;with family/visitor present;Other (comment)(pt sitting EOB) Nurse Communication: Mobility  status PT Visit Diagnosis: Unsteadiness on feet (R26.81);Other abnormalities of gait and mobility (R26.89)     Time: 2951-8841 PT Time Calculation (min) (ACUTE ONLY): 20 min  Charges:  $Gait Training: 8-22 mins                     Earney Navy, PTA Acute Rehabilitation Services Pager: [phone] Office: [phone]     Darliss Cheney 04/06/2018, 3:36 PM

## 2018-04-07 ENCOUNTER — Ambulatory Visit: Payer: Medicare Other | Admitting: Cardiovascular Disease

## 2018-04-07 ENCOUNTER — Telehealth: Payer: Self-pay | Admitting: Cardiovascular Disease

## 2018-04-07 ENCOUNTER — Telehealth: Payer: Self-pay | Admitting: Gastroenterology

## 2018-04-07 ENCOUNTER — Encounter: Payer: Self-pay | Admitting: Cardiovascular Disease

## 2018-04-07 DIAGNOSIS — N183 Chronic kidney disease, stage 3 (moderate): Secondary | ICD-10-CM | POA: Diagnosis not present

## 2018-04-07 DIAGNOSIS — I350 Nonrheumatic aortic (valve) stenosis: Secondary | ICD-10-CM

## 2018-04-07 DIAGNOSIS — I2510 Atherosclerotic heart disease of native coronary artery without angina pectoris: Secondary | ICD-10-CM | POA: Diagnosis not present

## 2018-04-07 DIAGNOSIS — I10 Essential (primary) hypertension: Secondary | ICD-10-CM

## 2018-04-07 DIAGNOSIS — J449 Chronic obstructive pulmonary disease, unspecified: Secondary | ICD-10-CM | POA: Diagnosis not present

## 2018-04-07 DIAGNOSIS — B3781 Candidal esophagitis: Secondary | ICD-10-CM | POA: Diagnosis not present

## 2018-04-07 DIAGNOSIS — Z7982 Long term (current) use of aspirin: Secondary | ICD-10-CM | POA: Diagnosis not present

## 2018-04-07 DIAGNOSIS — R0609 Other forms of dyspnea: Secondary | ICD-10-CM

## 2018-04-07 DIAGNOSIS — E785 Hyperlipidemia, unspecified: Secondary | ICD-10-CM

## 2018-04-07 DIAGNOSIS — Z5181 Encounter for therapeutic drug level monitoring: Secondary | ICD-10-CM | POA: Diagnosis not present

## 2018-04-07 DIAGNOSIS — R7881 Bacteremia: Secondary | ICD-10-CM | POA: Diagnosis not present

## 2018-04-07 DIAGNOSIS — N179 Acute kidney failure, unspecified: Secondary | ICD-10-CM

## 2018-04-07 DIAGNOSIS — Z452 Encounter for adjustment and management of vascular access device: Secondary | ICD-10-CM | POA: Diagnosis not present

## 2018-04-07 DIAGNOSIS — K222 Esophageal obstruction: Secondary | ICD-10-CM | POA: Diagnosis not present

## 2018-04-07 DIAGNOSIS — K851 Biliary acute pancreatitis without necrosis or infection: Secondary | ICD-10-CM | POA: Diagnosis not present

## 2018-04-07 DIAGNOSIS — I130 Hypertensive heart and chronic kidney disease with heart failure and stage 1 through stage 4 chronic kidney disease, or unspecified chronic kidney disease: Secondary | ICD-10-CM | POA: Diagnosis not present

## 2018-04-07 DIAGNOSIS — K8309 Other cholangitis: Secondary | ICD-10-CM

## 2018-04-07 DIAGNOSIS — Z951 Presence of aortocoronary bypass graft: Secondary | ICD-10-CM | POA: Diagnosis not present

## 2018-04-07 DIAGNOSIS — I519 Heart disease, unspecified: Secondary | ICD-10-CM

## 2018-04-07 DIAGNOSIS — Z792 Long term (current) use of antibiotics: Secondary | ICD-10-CM | POA: Diagnosis not present

## 2018-04-07 DIAGNOSIS — Z8551 Personal history of malignant neoplasm of bladder: Secondary | ICD-10-CM | POA: Diagnosis not present

## 2018-04-07 DIAGNOSIS — R0600 Dyspnea, unspecified: Secondary | ICD-10-CM

## 2018-04-07 DIAGNOSIS — I5030 Unspecified diastolic (congestive) heart failure: Secondary | ICD-10-CM | POA: Diagnosis not present

## 2018-04-07 DIAGNOSIS — I5189 Other ill-defined heart diseases: Secondary | ICD-10-CM

## 2018-04-07 DIAGNOSIS — B9620 Unspecified Escherichia coli [E. coli] as the cause of diseases classified elsewhere: Secondary | ICD-10-CM

## 2018-04-07 NOTE — Patient Instructions (Signed)
.  Medication Instructions:  The current medical regimen is effective;  continue present plan and medications.  If you need a refill on your cardiac medications before your next appointment, please call your pharmacy.   Lab work: BNP, and CMET If you have labs (blood work) drawn today and your tests are completely normal, you will receive your results only by: [name] MyChart Message (if you have MyChart) OR . A paper copy in the mail If you have any lab test that is abnormal or we need to change your treatment, we will call you to review the results.   Follow-Up: At [facility], you and your health needs are our priority.  As part of our continuing mission to provide you with exceptional heart care, we have created designated Provider Care Teams.  These Care Teams include your primary Cardiologist (physician) and Advanced Practice Providers (APPs -  Physician Assistants and Nurse Practitioners) who all work together to provide you with the care you need, when you need it. You will need a follow up appointment in 6 weeks.  Please call our office 2 months in advance to schedule this appointment.  You may see [name], MD or one of the following Advanced Practice Providers on your designated Care Team: [place], [place] . Fabian Sharp, PA-C

## 2018-04-07 NOTE — Telephone Encounter (Signed)
Dr. Rush Landmark, please see the note below.

## 2018-04-07 NOTE — Telephone Encounter (Signed)
That is fine with me, if okay with Dr. Rush Landmark. Thanks

## 2018-04-07 NOTE — Telephone Encounter (Signed)
Dr. Havery Moros, pt had surgery 3.13.20 by Dr. Rush Landmark and requested to schedule follow up with Dr. Rush Landmark.  Will you approve the switch?

## 2018-04-07 NOTE — Telephone Encounter (Signed)
RETURNED [place] THAT PT WAS D/C YESTERDAY FOR SEPSIS, PANCREATITIS, PITTING EDEMA INCREASED SOB AND CURRENTLY HAS PICC LINE WITH ANTIBIOTICS DAILY. SHE STATES THAT PT NEEDS TO HAVE MEDICATION ADJUSTED TO A RENAL DOSING PER D/C MD (PER DAUGHTER) HE IS NOT FEELING ANY BETTER (D/T SEPSIS) AND IS SOB W/VERY LITTLE EXERTION. SON HAS APPT TODAY AND WOULD LIKE TO BE SEEN FOR ADJUSTMENT OF MEDICATION AND FOR PITTING EDEMA THAT IS WORSE ON THE LEFT. HAS BEEN HAVING A LOW GRADE FEVER OF 99 INFORMED THAT WE ARE TAKING TEMP BEFORE ENTERING OUR SUITE. SHE WILL BE HERE AT 220 PM FOR APPT

## 2018-04-07 NOTE — Telephone Encounter (Signed)
Pt c/o Shortness Of Breath: STAT if SOB developed within the last 24 hours or pt is noticeably SOB on the phone  1. Are you currently SOB (can you hear that pt is SOB on the phone)? Yes, pt is not on the phone, daughter calling for him 2. How long have you been experiencing SOB? It started  About 3 nights ago- he have had some shortness of breath, bu het iis extremely short of breath and also edema in his left leg- pt just discharged yesterday evening  3. Are you SOB when sitting or when up moving around? both  4. Are you currently experiencing any other symptoms? No

## 2018-04-07 NOTE — Telephone Encounter (Signed)
Thank you for reaching out. I am OK with proceeding in this fashion and taking over care. GM

## 2018-04-07 NOTE — Telephone Encounter (Signed)
Patient was seen in office today.  Questions were answered.

## 2018-04-08 LAB — COMPREHENSIVE METABOLIC PANEL
ALK PHOS: 99 IU/L (ref 39–117)
ALT: 18 IU/L (ref 0–44)
AST: 27 IU/L (ref 0–40)
Albumin/Globulin Ratio: 1.1 — ABNORMAL LOW (ref 1.2–2.2)
Albumin: 3.3 g/dL — ABNORMAL LOW (ref 3.7–4.7)
BUN/Creatinine Ratio: 18 (ref 10–24)
BUN: 30 mg/dL — ABNORMAL HIGH (ref 8–27)
Bilirubin Total: 0.4 mg/dL (ref 0.0–1.2)
CO2: 25 mmol/L (ref 20–29)
CREATININE: 1.71 mg/dL — AB (ref 0.76–1.27)
Calcium: 8.1 mg/dL — ABNORMAL LOW (ref 8.6–10.2)
Chloride: 101 mmol/L (ref 96–106)
GFR calc Af Amer: 44 mL/min/{1.73_m2} — ABNORMAL LOW (ref >59)
GFR calc non Af Amer: 38 mL/min/{1.73_m2} — ABNORMAL LOW (ref >59)
Globulin, Total: 3 g/dL (ref 1.5–4.5)
Glucose: 160 mg/dL — ABNORMAL HIGH (ref 65–99)
Potassium: 5.1 mmol/L (ref 3.5–5.2)
Sodium: 143 mmol/L (ref 134–144)
Total Protein: 6.3 g/dL (ref 6.0–8.5)

## 2018-04-08 LAB — BRAIN NATRIURETIC PEPTIDE: BNP: 319.7 pg/mL — ABNORMAL HIGH (ref 0.0–100.0)

## 2018-04-09 ENCOUNTER — Encounter: Payer: Self-pay | Admitting: Cardiovascular Disease

## 2018-04-09 DIAGNOSIS — Z452 Encounter for adjustment and management of vascular access device: Secondary | ICD-10-CM | POA: Diagnosis not present

## 2018-04-09 DIAGNOSIS — Z5181 Encounter for therapeutic drug level monitoring: Secondary | ICD-10-CM | POA: Diagnosis not present

## 2018-04-09 DIAGNOSIS — B9620 Unspecified Escherichia coli [E. coli] as the cause of diseases classified elsewhere: Secondary | ICD-10-CM | POA: Diagnosis not present

## 2018-04-09 DIAGNOSIS — I130 Hypertensive heart and chronic kidney disease with heart failure and stage 1 through stage 4 chronic kidney disease, or unspecified chronic kidney disease: Secondary | ICD-10-CM | POA: Diagnosis not present

## 2018-04-09 DIAGNOSIS — E785 Hyperlipidemia, unspecified: Secondary | ICD-10-CM | POA: Diagnosis not present

## 2018-04-09 DIAGNOSIS — K222 Esophageal obstruction: Secondary | ICD-10-CM | POA: Diagnosis not present

## 2018-04-09 DIAGNOSIS — N183 Chronic kidney disease, stage 3 (moderate): Secondary | ICD-10-CM | POA: Diagnosis not present

## 2018-04-09 DIAGNOSIS — K851 Biliary acute pancreatitis without necrosis or infection: Secondary | ICD-10-CM | POA: Diagnosis not present

## 2018-04-09 DIAGNOSIS — Z7982 Long term (current) use of aspirin: Secondary | ICD-10-CM | POA: Diagnosis not present

## 2018-04-09 DIAGNOSIS — Z792 Long term (current) use of antibiotics: Secondary | ICD-10-CM | POA: Diagnosis not present

## 2018-04-09 DIAGNOSIS — I5030 Unspecified diastolic (congestive) heart failure: Secondary | ICD-10-CM | POA: Diagnosis not present

## 2018-04-09 DIAGNOSIS — J449 Chronic obstructive pulmonary disease, unspecified: Secondary | ICD-10-CM | POA: Diagnosis not present

## 2018-04-09 DIAGNOSIS — Z8551 Personal history of malignant neoplasm of bladder: Secondary | ICD-10-CM | POA: Diagnosis not present

## 2018-04-09 DIAGNOSIS — B3781 Candidal esophagitis: Secondary | ICD-10-CM | POA: Diagnosis not present

## 2018-04-09 DIAGNOSIS — I2510 Atherosclerotic heart disease of native coronary artery without angina pectoris: Secondary | ICD-10-CM | POA: Diagnosis not present

## 2018-04-09 NOTE — Progress Notes (Signed)
Patient ID: [name], male   DOB: 1940/04/01, 78 y.o.   MRN: [id number]      HPI: [name] is a 78 y.o. male who presents to the office today for a 4 month cardiology followup evaluation.  [name] has known CAD and underwent CABG revascularization surgery in 2001 with a LIMA to his LAD, vein to the marginal, vein to the RCA. Catheterization in 2004 showed patent grafts. An echo Doppler study in August 2012 revealed an ejection fraction of 40-45% with moderate septal hypokinesis with grade 1 diastolic dysfunction as well as aortic valve sclerosis. Additional problems include GERD, mild emphysema, as well as obstructive sleep apnea. He no longer utilizes CPAP. He has lost approximately 25 pounds since his wife's death from a motor vehicle accident in January 2013.  He remains active and notes mild shortness of breath with activity particularly when walking up hills but this has not changed.  He also is a history of restless leg syndrome and has benefited with requip. Laboratory in 2013 demonstrated LDL 57 with LDL particle #[id number], small LDL particle number was 611, slightly increased, HDLC was 35 triglycerides 93 total cholesterol 111 initial resistance course slightly elevated at 53.  BP blood work in March 2015 revealed normal renal function.  Fasting glucose was minimally increased at 106.  Total cholesterol was 116 triglycerides 80, and LDL 66, with an HDL of 34.  LDL particle number was slightly increased at 1190.  He undewent a five-year follow-up nuclear perfusion study in November 2016.  Ejection fraction was 54%.  He had normal perfusion.  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  He admits to arthralgias without myalgias.  He has issues with his in his legs and his peripheral neuropathy.  I further titrated his losartan to 75 mg and Bystolic to 10 mg.  He believes his blood pressure and pulse have been better.   He admits to leg discomfort which seems worse with atorvastatin.  When I last saw him, he had noticed some shortness of breath with uphill walking and with exertion but denied recurrent episodes of chest tightness.   He denied PND, orthopnea.  He underwent a follow-up echo Doppler study on 10/28/2016.  Ejection fraction was excellent at 60-65%.  There was grade 2 diastolic dysfunction.  There was evidence for aortic sclerosis without stenosis.  There was mild LA dilation.  He had trivial PR.    I saw him in October 2018. He subsequently  underwent left hand surgery.  His primary care with Dr. Burnice Hughes is retired.  When I last saw him in November 2019 recently, he  noticed progressive increase in shortness of breath and this seems to develop even trying to walk up very small hill.  He denied any definitive chest tightness.  He has started to run out of some of his chronic pain meds which had been prescribed by Dr. Burnice Hughes.  He has not yet reestablished with another primary physician.  He was on Bystolic 10 mg and isosorbide 60 mg for his CAD and hypertension.  He was on Zetia 10 mg and rosuvastatin for hyperlipidemia.  He continues to be on chronic aspirin therapy.  He has restless legs and is on Requip.  His previous primary physician had prescribed Effexor in addition to Percocet.  In November 2019, with his change in symptomatology I recommended a follow-up echo Doppler study and a lexiscan  Myoview.  As he had had some issues with elevated potassium on ARB therapy and I started him on HCTZ other than spironolactone.  December 23, 2017 an echo Doppler study showed an EF of 60 to 65% with grade 1 diastolic dysfunction and mild aortic valve stenosis.  A Lexiscan Myoview study on January 04, 2018 was low risk and demonstrated normal perfusion without scar or ischemia.  EF was 51%.  Since I saw him, he has been hospitalized from March 11 through April 05, 2020 presenting with abdominal discomfort,.  At  time, revealed leukocytosis to 21,400, elevated lactic acid of 2.4, elevated lipase at 390 urinalysis notable for proteinuria and a creatinine had risen to 1.59 and there was mild elevation of AST and total bilirubin.  He was ultimately had positive blood cultures for coli to have sepsis and colitis.  An MRCP showed acute pancreatitis, mild gallbladder distention with peri-cholecystic fluid and gallbladder wall thickening.  School he underwent ERCP on April 01, 2018 which showed stricture and sludge without stone he underwent sphincterotomy and stent placement.  He is planned to be on antibiotics for several weeks.  During his hospitalization he developed acute kidney injury with peak creatinine at 2.27.  His medications were adjusted and on discharge creatinine was 1.75.  He now presents for cardiology follow-up evaluation  Past Medical History:  Diagnosis Date  . Allergy   . B12 deficiency anemia   . Blood transfusion without reported diagnosis   . CAD (coronary artery disease)   . Colon polyps   . COPD (chronic obstructive pulmonary disease) ([place])   . Depression   . Esophagus, Barrett's   . GERD (gastroesophageal reflux disease)   . History of bladder cancer    Bladder cancer "8 times"  . Hyperlipidemia   . Hypertension   . Localized osteoarthrosis, lower leg   . Restless leg syndrome   . Sleep apnea   . Stenosis of esophagus     Past Surgical History:  Procedure Laterality Date  . BACK SURGERY    . BILIARY BRUSHING  04/01/2018   Procedure: BILIARY BRUSHING;  Surgeon: Jacob Landmark Telford Nab., MD;  Location: [facility];  Service: Gastroenterology;;  . BILIARY STENT PLACEMENT  04/01/2018   Procedure: BILIARY STENT PLACEMENT;  Surgeon: [name]., MD;  Location: [place];  Service: Gastroenterology;;  . BIOPSY  04/01/2018   Procedure: BIOPSY;  Surgeon: [name]., MD;  Location: [facility];  Service: Gastroenterology;;  . bladder cancer    . CERVICAL  DISCECTOMY     ACDF  . COLONOSCOPY  11/17/2005   normal   . CORONARY ARTERY BYPASS GRAFT     x4  . ENDOSCOPIC RETROGRADE CHOLANGIOPANCREATOGRAPHY (ERCP) WITH PROPOFOL N/A 04/01/2018   Procedure: ENDOSCOPIC RETROGRADE CHOLANGIOPANCREATOGRAPHY (ERCP) WITH PROPOFOL;  Surgeon: Jacob Landmark Telford Nab., MD;  Location: [place] [facility];  Service: Gastroenterology;  Laterality: N/A;  . ESOPHAGOGASTRODUODENOSCOPY  04/29/2010  . ESOPHAGOGASTRODUODENOSCOPY (EGD) WITH PROPOFOL N/A 04/01/2018   Procedure: ESOPHAGOGASTRODUODENOSCOPY (EGD) WITH PROPOFOL;  Surgeon: Jacob Landmark Telford Nab., MD;  Location: [place];  Service: Gastroenterology;  Laterality: N/A;  . EUS  04/01/2018   Procedure: FULL UPPER ENDOSCOPIC ULTRASOUND (EUS) RADIAL;  Surgeon: [name]., MD;  Location: [facility];  Service: Gastroenterology;;  . I&D EXTREMITY Left 11/24/2016   Procedure: IRRIGATION AND DEBRIDEMENT LEFT HAND, THUMB, INDEX, MIDDLE, RING, AND SMALL FINGERS WITH RECONSTRUCTION;  Surgeon: Roseanne Kaufman, MD;  Location: [place];  Service: Orthopedics;  Laterality: Left;  . KNEE ARTHROSCOPY    .  LUMBAR LAMINECTOMY     and fusion  . NASAL SINUS SURGERY    . POPLITEAL SYNOVIAL CYST EXCISION    . REMOVAL OF STONES  04/01/2018   Procedure: REMOVAL OF STONES;  Surgeon: Jacob Landmark Telford Nab., MD;  Location: [place];  Service: Gastroenterology;;  . Joan Mayans  04/01/2018   Procedure: Joan Mayans;  Surgeon: [name]., MD;  Location: [facility];  Service: Gastroenterology;;    Allergies  Allergen Reactions  . Losartan Potassium Other (See Comments)    Hyperkalemia  . Tape Rash    Itching, reddened skin Itching, reddened skin    Current Outpatient Medications  Medication Sig Dispense Refill  . acetaminophen (TYLENOL) 325 MG tablet Take 650 mg by mouth every 6 (six) hours as needed for mild pain.    [name] aspirin 81 MG tablet Take 81 mg by mouth daily.      [name] BYSTOLIC 10 MG tablet Take 1  tablet by mouth once daily 45 tablet 8  . cyanocobalamin (,VITAMIN B-12,) 1000 MCG/ML injection INJECT 1 ML (CC) INTRAMUSCULARLY FOR 1 DOSE (Patient taking differently: Inject 1,000 mcg into the muscle once. ) 6 mL 3  . dicyclomine (BENTYL) 10 MG capsule Take 1 capsule (10 mg total) by mouth every 8 (eight) hours as needed for spasms. 3060 capsule 1  . ertapenem (INVANZ) IVPB Inject 1 g into the vein daily for 10 days. Indication:  ESBL E. Coli Bacteremia Last Day of Therapy:  04/16/2018 Labs - Once weekly:  CBC/D and BMP, Labs - Every other week:  ESR and CRP 10 Units 0  . ezetimibe (ZETIA) 10 MG tablet TAKE 1 TABLET BY MOUTH ONCE DAILY (Patient taking differently: Take 10 mg by mouth daily. ) 90 tablet 2  . fluconazole (DIFLUCAN) 100 MG tablet Take 1 tablet (100 mg total) by mouth daily for 3 days. 3 tablet 0  . folic acid (FOLVITE) 638 MCG tablet Take 400 mcg by mouth daily.      . furosemide (LASIX) 40 MG tablet Take 1 tablet (40 mg total) by mouth every other day as needed for up to 30 days for fluid or edema. 15 tablet 0  . gabapentin (NEURONTIN) 600 MG tablet Take 1 tablet (600 mg total) by mouth 3 (three) times daily. 90 tablet 1  . isosorbide mononitrate (IMDUR) 60 MG 24 hr tablet TAKE 1 TABLET BY MOUTH ONCE DAILY (Patient taking differently: Take 60 mg by mouth daily. ) 90 tablet 3  . NEEDLE, DISP, 25 G (B-D DISP NEEDLE 25GX1") 25G X 1" MISC Inject 1000 mcg into muscle once a month. 50 each 0  . nitroGLYCERIN (NITROSTAT) 0.4 MG SL tablet Place 1 tablet (0.4 mg total) under the tongue every 5 (five) minutes as needed for chest pain. 30 tablet 3  . omeprazole (PRILOSEC) 40 MG capsule Take 1 capsule (40 mg total) by mouth daily. 30 capsule 6  . oxyCODONE-acetaminophen (PERCOCET) 10-325 MG tablet Take 1 tablet by mouth 2 (two) times daily as needed. 60 tablet 0  . Probiotic Product (PROBIOTIC ADVANCED PO) Take 1 tablet by mouth daily.    [name] rOPINIRole (REQUIP) 2 MG tablet TAKE 1 TABLET BY MOUTH  AT BEDTIME (Patient taking differently: Take 2 mg by mouth at bedtime. ) 90 tablet 1  . rosuvastatin (CRESTOR) 20 MG tablet TAKE 1 TABLET BY MOUTH ONCE DAILY (Patient taking differently: Take 20 mg by mouth daily. ) 90 tablet 0  . venlafaxine (EFFEXOR) 75 MG tablet Take 1 tablet (75 mg  total) by mouth daily. 90 tablet 1   No current facility-administered medications for this visit.    Socially he is widowed in January 2013. He has 3 children 6 grandchildren. 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 There is no recent tobacco use.  no alcohol.  His daughter is the wife of my patient Mr. Theodosia Paling.  ROS General: Negative; No fevers, chills, or night sweats;  HEENT: Negative; No changes in vision or hearing, sinus congestion, difficulty swallowing Pulmonary: Negative; No cough, wheezing, shortness of breath, hemoptysis Cardiovascular: Negative; No chest pain, presyncope, syncope, palpatations.  Minimal shortness of breath with activity GI: GERD; recent pancreatitis and septic cholangitis GU: Recent acute kidney injury Musculoskeletal: Positive for multiple joint aches involving his legs, hips, back, and shoulder Hematologic/Oncology: Negative; no easy bruising, bleeding Endocrine: Negative; no heat/cold intolerance; no diabetes Neuro: Negative; no changes in balance, headaches Skin: Negative; No rashes or skin lesions Psychiatric: Negative; No behavioral problems, depression Sleep: He no longer uses CPAP with his 25 pound weight loss.  His restless legs improved with low-dose Requip; No snoring, daytime sleepiness, hypersomnolence, bruxism, hypnogognic hallucinations, no cataplexy Other comprehensive 14 point system review is negative.   PE BP 110/76   Pulse 71   Ht '5\' 9"'$  (1.753 m)   Wt 190 lb 12.8 oz (86.5 kg)   BMI 28.18 kg/m    Repeat blood pressure by me was 118/64 supine and 114/62 standing  Wt  Readings from Last 3 Encounters:  04/07/18 190 lb 12.8 oz (86.5 kg)  03/31/18 185 lb 3 oz (84 kg)  01/04/18 191 lb (86.6 kg)   General: Alert, oriented, no distress.  Skin: normal turgor, no rashes, warm and dry HEENT: Normocephalic, atraumatic. Pupils equal round and reactive to light; sclera anicteric; extraocular muscles intact; F Nose without nasal septal hypertrophy Mouth/Parynx benign; Mallinpatti scale 3 Neck: No JVD, no carotid bruits; normal carotid upstroke Lungs: clear to ausculatation and percussion; no wheezing or rales Chest wall: without tenderness to palpitation Heart: PMI not displaced, RRR, s1 s2 normal, 1/6 systolic murmur, no diastolic murmur, no rubs, gallops, thrills, or heaves Abdomen: soft, nontender; no hepatosplenomehaly, BS+; abdominal aorta nontender and not dilated by palpation. Back: no CVA tenderness Pulses 2+ Musculoskeletal: full range of motion, normal strength, no joint deformities Extremities: PICC line in place right upper extremity; no clubbing cyanosis or edema, Homan's sign negative  Neurologic: grossly nonfocal; Cranial nerves grossly wnl Psychologic: Normal mood and affect    ECG (independently read by me): Sinus rhythm with mild sinus arrhythmia at 71 bpm.  QTc interval 465 ms.  No significant ST changes.  No ectopy.  December 13, 2017 ECG (independently read by me): NSR at 64; IRBBB; normal intervals  July 2018 ECG (independently read by me): Sinus bradycardia 53 bpm.  No ectopy.  Normal intervals.  No ST segment changes.  December 2017 ECG (independently read by me): Normal sinus rhythm at 74 bpm.  No ectopy.  Normal intervals.  No ST segment changes.  October 2016 ECG (independently read by me): Normal sinus rhythm at 61 bpm.  No ectopy.  Normal intervals.  May 2016 ECG (independently read by me): Sinus bradycardia 56 bpm.  No ectopy.  Mild RV conduction delay.  QTc interval 395  ECG (independently read by me): sinus bradycardia 56  bpm.  Mild RV conduction delay.  Borderline first-degree AV block with a  PR interval at 208 ms.  No ectopy.  Prior 06/05/2013 ECG (independently read by me): Normal sinus rhythm at 63 beats per minute.  No ectopy; normal intervals.  Prior November 2014 ECG: Sinus rhythm at 68beats per minute. There is mild RV conduction delay. Intervals were normal.  LABS:  BMP Latest Ref Rng & Units 04/07/2018 04/06/2018 04/05/2018  Glucose 65 - 99 mg/dL 160(H) 175(H) 132(H)  BUN 8 - 27 mg/dL 30(H) 32(H) 32(H)  Creatinine 0.76 - 1.27 mg/dL 1.71(H) 1.75(H) 1.80(H)  BUN/Creat Ratio 10 - 24 18 - -  Sodium 134 - 144 mmol/L 143 138 141  Potassium 3.5 - 5.2 mmol/L 5.1 4.1 4.5  Chloride 96 - 106 mmol/L 101 107 106  CO2 20 - 29 mmol/L '25 27 26  '$ Calcium 8.6 - 10.2 mg/dL 8.1(L) 7.7(L) 8.7(L)   Hepatic Function Latest Ref Rng & Units 04/07/2018 04/02/2018 04/01/2018  Total Protein 6.0 - 8.5 g/dL 6.3 5.6(L) 6.1(L)  Albumin 3.7 - 4.7 g/dL 3.3(L) 2.3(L) 2.7(L)  AST 0 - 40 IU/L 27 49(H) 58(H)  ALT 0 - 44 IU/L '18 22 24  '$ Alk Phosphatase 39 - 117 IU/L 99 47 61  Total Bilirubin 0.0 - 1.2 mg/dL 0.4 0.6 1.1  Bilirubin, Direct 0.0 - 0.3 mg/dL - - -    CBC Latest Ref Rng & Units 04/04/2018 04/01/2018 03/31/2018  WBC 4.0 - 10.5 K/uL 11.7(H) 11.4(H) 12.6(H)  Hemoglobin 13.0 - 17.0 g/dL 9.7(L) 9.6(L) 11.7(L)  Hematocrit 39.0 - 52.0 % 30.4(L) 31.1(L) 36.0(L)  Platelets 150 - 400 K/uL 130(L) PLATELET CLUMPS NOTED ON SMEAR, UNABLE TO ESTIMATE PLATELET CLUMPS NOTED ON SMEAR, UNABLE TO ESTIMATE     Lipid Panel     Component Value Date/Time   CHOL 80 03/31/2018 0508   CHOL 99 (L) 12/24/2017 0819   CHOL 116 03/30/2013 1053   TRIG 72 03/31/2018 0508   TRIG 80 03/30/2013 1053   HDL 32 (L) 03/31/2018 0508   HDL 28 (L) 12/24/2017 0819   HDL 34 (L) 03/30/2013 1053   CHOLHDL 2.5 03/31/2018 0508   VLDL 14 03/31/2018 0508   LDLCALC 34 03/31/2018 0508   LDLCALC 32 12/24/2017 0819   LDLCALC 66 03/30/2013 1053     RADIOLOGY: No  results found.  IMPRESSION:  1. Coronary artery disease involving native coronary artery of native heart without angina pectoris   2. Hx of CABG   3. Mild aortic stenosis   4. Grade II diastolic dysfunction   5. Essential hypertension   6. Hyperlipidemia with target LDL less than 70   7. Exertional dyspnea   8. E coli bacteremia   9. AKI (acute kidney injury) ([place])   10. Ascending cholangitis     ASSESSMENT AND PLAN:  Mr. Sabo is a 78 year old Caucasian male who has known CAD and is 19 years status post his CABG revascularization surgery in 2001. His last catheterization in 2004 for recurrent chest pain continued to show patent grafts. His ejection fraction of 40-45% was documented in August 2012.  In October 2018 an echo revealed an EF of 60 to 65% with grade 2 diastolic dysfunction, aortic valve sclerosis without stenosis mild left atrial dilation.  When I last saw him in November 2020 he was having issues with mild shortness of breath swelling.  His most recent echo in December 2019 continue to show an EF of 60 to 65%.  There was now grade 1 diastolic dysfunction.  There was mild aortic stenosis. A Lexiscan Myoview study  on January 04, 2018 was low risk and showed an EF of 51% without ischemia.  I extensively reviewed his most recent hospitalization where he was remitted with pancreatitis and felt to have a sending cholangitis with E. coli sepsis.  He underwent sphincterotomy and stent placement in his bile duct by Dr. Justice Britain.  Presently, he appears euvolemic.  He was discharged yesterday with a creatinine of 1.75 which had improved from a peak of 2.27.  His ECG today remains stable and demonstrates sinus rhythm.  He appears euvolemic.  He was sent home from the hospital on furosemide 40 mg every other day, isosorbide 60 mg, ertapenem IVPB for his E. coli bacteremia in addition to his Zetia 10 mg and aspirin.  He continues to be on gabapentin.  He is no longer on ARB therapy with  losartan.  There is only  trivial residual left ankle edema.  I have suggested he try reducing furosemide to 20 mg.  I will recheck a Cmet in BNP level in the office today.  I will see him in the office in 6 weeks for reevaluation or sooner as needed.  Time spent: 30 minutes  Troy Sine, MD, [facility]  04/09/2018 11:32 AM

## 2018-04-10 ENCOUNTER — Encounter: Payer: Self-pay | Admitting: Gastroenterology

## 2018-04-11 ENCOUNTER — Other Ambulatory Visit: Payer: Self-pay | Admitting: Physical Medicine & Rehabilitation

## 2018-04-12 DIAGNOSIS — B3781 Candidal esophagitis: Secondary | ICD-10-CM | POA: Diagnosis not present

## 2018-04-12 DIAGNOSIS — Z5181 Encounter for therapeutic drug level monitoring: Secondary | ICD-10-CM | POA: Diagnosis not present

## 2018-04-12 DIAGNOSIS — I2510 Atherosclerotic heart disease of native coronary artery without angina pectoris: Secondary | ICD-10-CM | POA: Diagnosis not present

## 2018-04-12 DIAGNOSIS — B9620 Unspecified Escherichia coli [E. coli] as the cause of diseases classified elsewhere: Secondary | ICD-10-CM | POA: Diagnosis not present

## 2018-04-12 DIAGNOSIS — E785 Hyperlipidemia, unspecified: Secondary | ICD-10-CM | POA: Diagnosis not present

## 2018-04-12 DIAGNOSIS — K222 Esophageal obstruction: Secondary | ICD-10-CM | POA: Diagnosis not present

## 2018-04-12 DIAGNOSIS — Z7982 Long term (current) use of aspirin: Secondary | ICD-10-CM | POA: Diagnosis not present

## 2018-04-12 DIAGNOSIS — Z792 Long term (current) use of antibiotics: Secondary | ICD-10-CM | POA: Diagnosis not present

## 2018-04-12 DIAGNOSIS — I5030 Unspecified diastolic (congestive) heart failure: Secondary | ICD-10-CM | POA: Diagnosis not present

## 2018-04-12 DIAGNOSIS — N183 Chronic kidney disease, stage 3 (moderate): Secondary | ICD-10-CM | POA: Diagnosis not present

## 2018-04-12 DIAGNOSIS — Z452 Encounter for adjustment and management of vascular access device: Secondary | ICD-10-CM | POA: Diagnosis not present

## 2018-04-12 DIAGNOSIS — Z8551 Personal history of malignant neoplasm of bladder: Secondary | ICD-10-CM | POA: Diagnosis not present

## 2018-04-12 DIAGNOSIS — I130 Hypertensive heart and chronic kidney disease with heart failure and stage 1 through stage 4 chronic kidney disease, or unspecified chronic kidney disease: Secondary | ICD-10-CM | POA: Diagnosis not present

## 2018-04-12 DIAGNOSIS — J449 Chronic obstructive pulmonary disease, unspecified: Secondary | ICD-10-CM | POA: Diagnosis not present

## 2018-04-12 DIAGNOSIS — K851 Biliary acute pancreatitis without necrosis or infection: Secondary | ICD-10-CM | POA: Diagnosis not present

## 2018-04-14 ENCOUNTER — Telehealth: Payer: Self-pay | Admitting: Internal Medicine

## 2018-04-14 DIAGNOSIS — R7881 Bacteremia: Secondary | ICD-10-CM | POA: Diagnosis not present

## 2018-04-14 NOTE — Telephone Encounter (Signed)
WebEx appointment made 

## 2018-04-14 NOTE — Telephone Encounter (Signed)
Copied from [place] [id number]. Topic: General - Other >> Apr 14, 2018  1:53 PM Lennox Solders wrote: Reason for CRM: terry willard friend left on refill mailbox that patient just got  out of hospital and needs refill on gabapentin . This med was last prescribed by dr patel. Pt no longer sees dr patel. [facility] [address]

## 2018-04-14 NOTE — Telephone Encounter (Signed)
Why does he need this med? Seems like it was last prescribed in 2017. Webex visit?

## 2018-04-15 ENCOUNTER — Ambulatory Visit (INDEPENDENT_AMBULATORY_CARE_PROVIDER_SITE_OTHER): Payer: Medicare Other | Admitting: Internal Medicine

## 2018-04-15 ENCOUNTER — Other Ambulatory Visit: Payer: Self-pay

## 2018-04-15 DIAGNOSIS — K2100 Gastro-esophageal reflux disease with esophagitis, without bleeding: Secondary | ICD-10-CM

## 2018-04-15 DIAGNOSIS — Z79899 Other long term (current) drug therapy: Secondary | ICD-10-CM

## 2018-04-15 DIAGNOSIS — K8309 Other cholangitis: Secondary | ICD-10-CM

## 2018-04-15 DIAGNOSIS — Z09 Encounter for follow-up examination after completed treatment for conditions other than malignant neoplasm: Secondary | ICD-10-CM

## 2018-04-15 DIAGNOSIS — A499 Bacterial infection, unspecified: Secondary | ICD-10-CM | POA: Diagnosis not present

## 2018-04-15 DIAGNOSIS — G894 Chronic pain syndrome: Secondary | ICD-10-CM | POA: Diagnosis not present

## 2018-04-15 DIAGNOSIS — A419 Sepsis, unspecified organism: Secondary | ICD-10-CM | POA: Diagnosis not present

## 2018-04-15 DIAGNOSIS — Z1612 Extended spectrum beta lactamase (ESBL) resistance: Secondary | ICD-10-CM

## 2018-04-15 MED ORDER — OXYCODONE-ACETAMINOPHEN 10-325 MG PO TABS: 1.0000 | ORAL_TABLET | Freq: Four times a day (QID) | ORAL | 0 refills | Status: DC | PRN

## 2018-04-15 MED ORDER — PANTOPRAZOLE SODIUM 40 MG PO TBEC: 40.0000 mg | DELAYED_RELEASE_TABLET | Freq: Every day | ORAL | 3 refills | Status: DC

## 2018-04-15 NOTE — Progress Notes (Signed)
Virtual Visit via Video Note  I connected with [name] on 04/15/18 at  9:30 AM EDT by a video enabled telemedicine application and verified that I am speaking with the correct person using two identifiers.  Location patient: home Location provider: work office Persons participating in the virtual visit: patient, provider, daughter [name] who is a Marine scientist and helps with history and technology portion of visit  I discussed the limitations of evaluation and management by telemedicine and the availability of in person appointments. The patient expressed understanding and agreed to proceed.   HPI: The purpose of this visit is for hospital follow-up.  [name] was hospitalized from 3/11 through 04/06/2018 for ascending cholangitis with sepsis due to ESBL E. coli.  He was sent home on Invanz for 14 days which is to be completed on 04/16/2018 via PICC line.  [facility] charge of this.  GI physician was Dr. Justice Britain, has follow-up appointment with him on 4/21 to discuss repeat ERCP due to atypical bile duct cells found during procedure.  During his hospitalization he also developed acute kidney injury with a peak creatinine of 2.27 that had decreased to 1.75 at discharge, daughter tells me [facility] she will repeat labs 3 days ago at home and creatinine has further decreased to 1.13 which is better than his baseline of around 1.3.  He also had esophageal candidiasis and was discharged with a course of Diflucan.  He was given as needed Lasix for edema, edema has resolved so daughter has stopped giving him Lasix.  Since hospital discharge his acute symptoms are mostly resolved, he still has some right upper quadrant pain and epigastric pain in addition to feeling extremely weak.  He is on a pain contract with me and receives oxycodone 10 mg 3 times daily as needed with a limit of 60 tablets a month, he states that since his hospital discharge he is having more  pain and wonders what we can do about this.  He also would like to change over-the-counter Prilosec to an actual prescription for Protonix because "it does not work as well"   ROS: Constitutional: Denies fever, chills, diaphoresis, appetite change and fatigue.  HEENT: Denies photophobia, eye pain, redness, hearing loss, ear pain, congestion, sore throat, rhinorrhea, sneezing, mouth sores, trouble swallowing, neck pain, neck stiffness and tinnitus.   Respiratory: Denies SOB, DOE, cough, chest tightness,  and wheezing.   Cardiovascular: Denies chest pain, palpitations and leg swelling.  Gastrointestinal: Denies nausea, vomiting, abdominal pain, diarrhea, constipation, blood in stool and abdominal distention.  Genitourinary: Denies dysuria, urgency, frequency, hematuria, flank pain and difficulty urinating.  Endocrine: Denies: hot or cold intolerance, sweats, changes in hair or nails, polyuria, polydipsia. Musculoskeletal: Denies myalgias, back pain, joint swelling, arthralgias and gait problem.  Skin: Denies pallor, rash and wound.  Neurological: Denies dizziness, seizures, syncope, weakness, light-headedness, numbness and headaches.  Hematological: Denies adenopathy. Easy bruising, personal or family bleeding history  Psychiatric/Behavioral: Denies suicidal ideation, mood changes, confusion, nervousness, sleep disturbance and agitation   Past Medical History:  Diagnosis Date  . Allergy   . B12 deficiency anemia   . Blood transfusion without reported diagnosis   . CAD (coronary artery disease)   . Colon polyps   . COPD (chronic obstructive pulmonary disease) ([place])   . Depression   . Esophagus, Barrett's   . GERD (gastroesophageal reflux disease)   . History of bladder cancer    Bladder cancer "8 times"  .  Hyperlipidemia   . Hypertension   . Localized osteoarthrosis, lower leg   . Restless leg syndrome   . Sleep apnea   . Stenosis of esophagus     Past Surgical History:   Procedure Laterality Date  . BACK SURGERY    . BILIARY BRUSHING  04/01/2018   Procedure: BILIARY BRUSHING;  Surgeon: Rush Landmark Telford Nab., MD;  Location: [place];  Service: Gastroenterology;;  . BILIARY STENT PLACEMENT  04/01/2018   Procedure: BILIARY STENT PLACEMENT;  Surgeon: Irving Copas., MD;  Location: [place];  Service: Gastroenterology;;  . BIOPSY  04/01/2018   Procedure: BIOPSY;  Surgeon: Irving Copas., MD;  Location: [place];  Service: Gastroenterology;;  . bladder cancer    . CERVICAL DISCECTOMY     ACDF  . COLONOSCOPY  11/17/2005   normal   . CORONARY ARTERY BYPASS GRAFT     x4  . ENDOSCOPIC RETROGRADE CHOLANGIOPANCREATOGRAPHY (ERCP) WITH PROPOFOL N/A 04/01/2018   Procedure: ENDOSCOPIC RETROGRADE CHOLANGIOPANCREATOGRAPHY (ERCP) WITH PROPOFOL;  Surgeon: Rush Landmark Telford Nab., MD;  Location: [place];  Service: Gastroenterology;  Laterality: N/A;  . ESOPHAGOGASTRODUODENOSCOPY  04/29/2010  . ESOPHAGOGASTRODUODENOSCOPY (EGD) WITH PROPOFOL N/A 04/01/2018   Procedure: ESOPHAGOGASTRODUODENOSCOPY (EGD) WITH PROPOFOL;  Surgeon: Rush Landmark Telford Nab., MD;  Location: [place];  Service: Gastroenterology;  Laterality: N/A;  . EUS  04/01/2018   Procedure: FULL UPPER ENDOSCOPIC ULTRASOUND (EUS) RADIAL;  Surgeon: Irving Copas., MD;  Location: [place];  Service: Gastroenterology;;  . I&D EXTREMITY Left 11/24/2016   Procedure: IRRIGATION AND DEBRIDEMENT LEFT HAND, THUMB, INDEX, MIDDLE, RING, AND SMALL FINGERS WITH RECONSTRUCTION;  Surgeon: Roseanne Kaufman, MD;  Location: [place];  Service: Orthopedics;  Laterality: Left;  . KNEE ARTHROSCOPY    . LUMBAR LAMINECTOMY     and fusion  . NASAL SINUS SURGERY    . POPLITEAL SYNOVIAL CYST EXCISION    . REMOVAL OF STONES  04/01/2018   Procedure: REMOVAL OF STONES;  Surgeon: Rush Landmark Telford Nab., MD;  Location: [place];  Service: Gastroenterology;;  . [name]  04/01/2018   Procedure:  [name];  Surgeon: Mansouraty, Telford Nab., MD;  Location: [facility] ENDOSCOPY;  Service: Gastroenterology;;    Family History  Problem Relation Age of Onset  . Melanoma Mother   . Stroke Father   . Hypertension Father   . Coronary artery disease Other   . Colon cancer Neg Hx   . Esophageal cancer Neg Hx   . Stomach cancer Neg Hx   . Rectal cancer Neg Hx     SOCIAL HX:   reports that he quit smoking about 42 years ago. He has never used smokeless tobacco. He reports that he does not drink alcohol or use drugs.   Current Outpatient Medications:  .  acetaminophen (TYLENOL) 325 MG tablet, Take 650 mg by mouth every 6 (six) hours as needed for mild pain., Disp: , Rfl:  .  aspirin 81 MG tablet, Take 81 mg by mouth daily.  , Disp: , Rfl:  .  BYSTOLIC 10 MG tablet, Take 1 tablet by mouth once daily, Disp: 45 tablet, Rfl: 8 .  cyanocobalamin (,VITAMIN B-12,) 1000 MCG/ML injection, INJECT 1 ML (CC) INTRAMUSCULARLY FOR 1 DOSE (Patient taking differently: Inject 1,000 mcg into the muscle once. ), Disp: 6 mL, Rfl: 3 .  dicyclomine (BENTYL) 10 MG capsule, Take 1 capsule (10 mg total) by mouth every 8 (eight) hours as needed for spasms., Disp: 3060 capsule, Rfl: 1 .  ertapenem (INVANZ) IVPB, Inject 1 g into  the vein daily for 10 days. Indication:  ESBL E. Coli Bacteremia Last Day of Therapy:  04/16/2018 Labs - Once weekly:  CBC/D and BMP, Labs - Every other week:  ESR and CRP, Disp: 10 Units, Rfl: 0 .  ezetimibe (ZETIA) 10 MG tablet, TAKE 1 TABLET BY MOUTH ONCE DAILY (Patient taking differently: Take 10 mg by mouth daily. ), Disp: 90 tablet, Rfl: 2 .  folic acid (FOLVITE) 630 MCG tablet, Take 400 mcg by mouth daily.  , Disp: , Rfl:  .  furosemide (LASIX) 40 MG tablet, Take 1 tablet (40 mg total) by mouth every other day as needed for up to 30 days for fluid or edema., Disp: 15 tablet, Rfl: 0 .  gabapentin (NEURONTIN) 600 MG tablet, Take 1 tablet (600 mg total) by mouth 3 (three) times daily., Disp: 90  tablet, Rfl: 1 .  isosorbide mononitrate (IMDUR) 60 MG 24 hr tablet, TAKE 1 TABLET BY MOUTH ONCE DAILY (Patient taking differently: Take 60 mg by mouth daily. ), Disp: 90 tablet, Rfl: 3 .  NEEDLE, DISP, 25 G (B-D DISP NEEDLE 25GX1") 25G X 1" MISC, Inject 1000 mcg into muscle once a month., Disp: 50 each, Rfl: 0 .  nitroGLYCERIN (NITROSTAT) 0.4 MG SL tablet, Place 1 tablet (0.4 mg total) under the tongue every 5 (five) minutes as needed for chest pain., Disp: 30 tablet, Rfl: 3 .  oxyCODONE-acetaminophen (PERCOCET) 10-325 MG tablet, Take 1 tablet by mouth every 6 (six) hours as needed., Disp: 120 tablet, Rfl: 0 .  pantoprazole (PROTONIX) 40 MG tablet, Take 1 tablet (40 mg total) by mouth daily., Disp: 30 tablet, Rfl: 3 .  Probiotic Product (PROBIOTIC ADVANCED PO), Take 1 tablet by mouth daily., Disp: , Rfl:  .  rOPINIRole (REQUIP) 2 MG tablet, TAKE 1 TABLET BY MOUTH AT BEDTIME (Patient taking differently: Take 2 mg by mouth at bedtime. ), Disp: 90 tablet, Rfl: 1 .  rosuvastatin (CRESTOR) 20 MG tablet, TAKE 1 TABLET BY MOUTH ONCE DAILY (Patient taking differently: Take 20 mg by mouth daily. ), Disp: 90 tablet, Rfl: 0 .  venlafaxine (EFFEXOR) 75 MG tablet, Take 1 tablet (75 mg total) by mouth daily., Disp: 90 tablet, Rfl: 1  EXAM:   VITALS per patient if applicable: None available  GENERAL: alert, oriented, appears well and in no acute distress, pale  HEENT: atraumatic, conjunttiva clear, no obvious abnormalities on inspection of external nose and ears  NECK: normal movements of the head and neck  LUNGS: on inspection no signs of respiratory distress, breathing rate appears normal, no obvious gross increased work of breathing, gasping or wheezing  CV: no obvious cyanosis  MS: moves all visible extremities without noticeable abnormality, no significant lower extremity edema  PSYCH/NEURO: pleasant and cooperative, no obvious depression or anxiety, speech and thought processing grossly intact   ASSESSMENT AND PLAN:   Hospital discharge follow-up ESBL (extended spectrum beta-lactamase) producing bacteria infection Sepsis, due to unspecified organism, unspecified whether acute organ dysfunction present [facility]) Ascending cholangitis -Hospital records were reviewed in detail. -He is to complete his ertapenem tomorrow on 3/28. -Daughter will contact [facility] to see if they already have an order for PICC line removal after completion of antibiotic therapy, otherwise she knows to contact us so we can provide this. -He has follow-up with GI next month, there is some concern for atypical cells on brushing of common bile duct. -His acute renal failure has resolved and his creatinine is currently better than baseline.  Chronic  pain syndrome High risk medication use  -Understandably so, he has had increasing pain requirements since discharge from hospital. -He is under a pain contract with me, in fact is the only patient in my practice who is on oxycodone.  He is prescribed 10/325 mg up to 3 times a day as needed with a limit of 60 tablets a month. -I have reviewed the [place] controlled substance database today.  He has no red flags, his overdose risk score is 70. -Will allow a one-time refill of 120 tablets so he can take it 4 times a day for the next 30 days as he recovers from his hospitalization and ERCP with stenting.  Gastroesophageal reflux disease with esophagitis -Per patient request, will substitute Prilosec OTC for branded Protonix 40 mg daily.  Prescription has been sent.     I discussed the assessment and treatment plan with the patient. The patient was provided an opportunity to ask questions and all were answered. The patient agreed with the plan and demonstrated an understanding of the instructions.   The patient was advised to call back or seek an in-person evaluation if the symptoms worsen or if the condition fails to improve as anticipated.  I provided  26 minutes of non-face-to-face time during this encounter.   [name], MD  [facility]

## 2018-04-16 DIAGNOSIS — B3781 Candidal esophagitis: Secondary | ICD-10-CM | POA: Diagnosis not present

## 2018-04-16 DIAGNOSIS — Z7982 Long term (current) use of aspirin: Secondary | ICD-10-CM | POA: Diagnosis not present

## 2018-04-16 DIAGNOSIS — Z792 Long term (current) use of antibiotics: Secondary | ICD-10-CM | POA: Diagnosis not present

## 2018-04-16 DIAGNOSIS — J449 Chronic obstructive pulmonary disease, unspecified: Secondary | ICD-10-CM | POA: Diagnosis not present

## 2018-04-16 DIAGNOSIS — K851 Biliary acute pancreatitis without necrosis or infection: Secondary | ICD-10-CM | POA: Diagnosis not present

## 2018-04-16 DIAGNOSIS — I130 Hypertensive heart and chronic kidney disease with heart failure and stage 1 through stage 4 chronic kidney disease, or unspecified chronic kidney disease: Secondary | ICD-10-CM | POA: Diagnosis not present

## 2018-04-16 DIAGNOSIS — K222 Esophageal obstruction: Secondary | ICD-10-CM | POA: Diagnosis not present

## 2018-04-16 DIAGNOSIS — Z452 Encounter for adjustment and management of vascular access device: Secondary | ICD-10-CM | POA: Diagnosis not present

## 2018-04-16 DIAGNOSIS — N183 Chronic kidney disease, stage 3 (moderate): Secondary | ICD-10-CM | POA: Diagnosis not present

## 2018-04-16 DIAGNOSIS — I5030 Unspecified diastolic (congestive) heart failure: Secondary | ICD-10-CM | POA: Diagnosis not present

## 2018-04-16 DIAGNOSIS — B9620 Unspecified Escherichia coli [E. coli] as the cause of diseases classified elsewhere: Secondary | ICD-10-CM | POA: Diagnosis not present

## 2018-04-16 DIAGNOSIS — Z5181 Encounter for therapeutic drug level monitoring: Secondary | ICD-10-CM | POA: Diagnosis not present

## 2018-04-16 DIAGNOSIS — Z8551 Personal history of malignant neoplasm of bladder: Secondary | ICD-10-CM | POA: Diagnosis not present

## 2018-04-16 DIAGNOSIS — E785 Hyperlipidemia, unspecified: Secondary | ICD-10-CM | POA: Diagnosis not present

## 2018-04-16 DIAGNOSIS — I2510 Atherosclerotic heart disease of native coronary artery without angina pectoris: Secondary | ICD-10-CM | POA: Diagnosis not present

## 2018-04-18 ENCOUNTER — Telehealth: Payer: Self-pay | Admitting: Internal Medicine

## 2018-04-18 DIAGNOSIS — E785 Hyperlipidemia, unspecified: Secondary | ICD-10-CM | POA: Diagnosis not present

## 2018-04-18 DIAGNOSIS — B9620 Unspecified Escherichia coli [E. coli] as the cause of diseases classified elsewhere: Secondary | ICD-10-CM | POA: Diagnosis not present

## 2018-04-18 DIAGNOSIS — Z5181 Encounter for therapeutic drug level monitoring: Secondary | ICD-10-CM | POA: Diagnosis not present

## 2018-04-18 DIAGNOSIS — J449 Chronic obstructive pulmonary disease, unspecified: Secondary | ICD-10-CM | POA: Diagnosis not present

## 2018-04-18 DIAGNOSIS — Z8551 Personal history of malignant neoplasm of bladder: Secondary | ICD-10-CM | POA: Diagnosis not present

## 2018-04-18 DIAGNOSIS — I2510 Atherosclerotic heart disease of native coronary artery without angina pectoris: Secondary | ICD-10-CM | POA: Diagnosis not present

## 2018-04-18 DIAGNOSIS — Z452 Encounter for adjustment and management of vascular access device: Secondary | ICD-10-CM | POA: Diagnosis not present

## 2018-04-18 DIAGNOSIS — K222 Esophageal obstruction: Secondary | ICD-10-CM | POA: Diagnosis not present

## 2018-04-18 DIAGNOSIS — B3781 Candidal esophagitis: Secondary | ICD-10-CM | POA: Diagnosis not present

## 2018-04-18 DIAGNOSIS — I130 Hypertensive heart and chronic kidney disease with heart failure and stage 1 through stage 4 chronic kidney disease, or unspecified chronic kidney disease: Secondary | ICD-10-CM | POA: Diagnosis not present

## 2018-04-18 DIAGNOSIS — Z792 Long term (current) use of antibiotics: Secondary | ICD-10-CM | POA: Diagnosis not present

## 2018-04-18 DIAGNOSIS — K851 Biliary acute pancreatitis without necrosis or infection: Secondary | ICD-10-CM | POA: Diagnosis not present

## 2018-04-18 DIAGNOSIS — Z7982 Long term (current) use of aspirin: Secondary | ICD-10-CM | POA: Diagnosis not present

## 2018-04-18 DIAGNOSIS — N183 Chronic kidney disease, stage 3 (moderate): Secondary | ICD-10-CM | POA: Diagnosis not present

## 2018-04-18 DIAGNOSIS — I5030 Unspecified diastolic (congestive) heart failure: Secondary | ICD-10-CM | POA: Diagnosis not present

## 2018-04-18 NOTE — Telephone Encounter (Signed)
Copied from [place] [phone]. Topic: Quick Communication - See Telephone Encounter >> Apr 18, 2018  9:12 AM Vernona Rieger wrote: CRM for notification. See Telephone encounter for: 04/18/18.  Arbie Cookey, RN with [facility] called and said she left a detailed message last week on a voicemail but has not heard back. She would like to know if Dr Jerilee Hoh will be signing the nursing orders for him? Call back @ [phone]

## 2018-04-19 ENCOUNTER — Telehealth: Payer: Self-pay | Admitting: Internal Medicine

## 2018-04-19 DIAGNOSIS — Z5181 Encounter for therapeutic drug level monitoring: Secondary | ICD-10-CM | POA: Diagnosis not present

## 2018-04-19 DIAGNOSIS — I5030 Unspecified diastolic (congestive) heart failure: Secondary | ICD-10-CM | POA: Diagnosis not present

## 2018-04-19 DIAGNOSIS — K222 Esophageal obstruction: Secondary | ICD-10-CM | POA: Diagnosis not present

## 2018-04-19 DIAGNOSIS — I130 Hypertensive heart and chronic kidney disease with heart failure and stage 1 through stage 4 chronic kidney disease, or unspecified chronic kidney disease: Secondary | ICD-10-CM | POA: Diagnosis not present

## 2018-04-19 DIAGNOSIS — Z8551 Personal history of malignant neoplasm of bladder: Secondary | ICD-10-CM | POA: Diagnosis not present

## 2018-04-19 DIAGNOSIS — B9620 Unspecified Escherichia coli [E. coli] as the cause of diseases classified elsewhere: Secondary | ICD-10-CM | POA: Diagnosis not present

## 2018-04-19 DIAGNOSIS — Z452 Encounter for adjustment and management of vascular access device: Secondary | ICD-10-CM | POA: Diagnosis not present

## 2018-04-19 DIAGNOSIS — I2510 Atherosclerotic heart disease of native coronary artery without angina pectoris: Secondary | ICD-10-CM | POA: Diagnosis not present

## 2018-04-19 DIAGNOSIS — N183 Chronic kidney disease, stage 3 (moderate): Secondary | ICD-10-CM | POA: Diagnosis not present

## 2018-04-19 DIAGNOSIS — Z792 Long term (current) use of antibiotics: Secondary | ICD-10-CM | POA: Diagnosis not present

## 2018-04-19 DIAGNOSIS — K851 Biliary acute pancreatitis without necrosis or infection: Secondary | ICD-10-CM | POA: Diagnosis not present

## 2018-04-19 DIAGNOSIS — B3781 Candidal esophagitis: Secondary | ICD-10-CM | POA: Diagnosis not present

## 2018-04-19 DIAGNOSIS — Z7982 Long term (current) use of aspirin: Secondary | ICD-10-CM | POA: Diagnosis not present

## 2018-04-19 DIAGNOSIS — J449 Chronic obstructive pulmonary disease, unspecified: Secondary | ICD-10-CM | POA: Diagnosis not present

## 2018-04-19 DIAGNOSIS — E785 Hyperlipidemia, unspecified: Secondary | ICD-10-CM | POA: Diagnosis not present

## 2018-04-19 NOTE — Telephone Encounter (Signed)
Spoke with Arbie Cookey at [facility]

## 2018-04-19 NOTE — Telephone Encounter (Signed)
Copied from [place] [phone]. Topic: Quick Communication - Home Health Verbal Orders >> Apr 19, 2018 11:20 AM [place] wrote: Caller/Agency: [facility] Number:[phone] Antibodies have run thru, at patient home now, requesting order to removed Pic line Call asap at PT home

## 2018-04-19 NOTE — Telephone Encounter (Signed)
What orders? Don't believe I have heard anything about this. But yes, I can sign his HH orders.

## 2018-04-20 NOTE — Telephone Encounter (Signed)
Verbal orders given to Tina. °

## 2018-04-20 NOTE — Telephone Encounter (Signed)
OK for [facility] to remove PICC

## 2018-04-20 NOTE — Telephone Encounter (Signed)
Tina w/[facility] called to check on VO to d/c Picc Line. Advised her Apolonio Schneiders will call back once ok'd by Dr. Jerilee Hoh.

## 2018-05-03 ENCOUNTER — Ambulatory Visit: Payer: Medicare Other | Admitting: Gastroenterology

## 2018-05-04 ENCOUNTER — Telehealth: Payer: Self-pay

## 2018-05-04 NOTE — Telephone Encounter (Signed)
-----   Message from Timothy Lasso, RN sent at 04/06/2018  8:41 AM EDT ----- Please send letter when able. Please schedule a CT abdomen/pelvis pancreas protocol with/without contrast approximately 4 to 5 weeks since his last imaging study. Please schedule a follow-up in clinic thereafter with me so I can review this and plan accordingly a possible repeat EUS/ERCP. Tentatively let us get the patient on the list for a ERCP/EUS in approximately 6 to 7 weeks. Thank you.  GM

## 2018-05-05 ENCOUNTER — Other Ambulatory Visit: Payer: Self-pay | Admitting: *Deleted

## 2018-05-05 MED ORDER — ROPINIROLE HCL 2 MG PO TABS: 2.0000 mg | ORAL_TABLET | Freq: Every day | ORAL | 1 refills | Status: DC

## 2018-05-05 NOTE — Telephone Encounter (Signed)
Message sent to myself to call pt in 4-5 weeks to set up CT scan and follow up

## 2018-05-10 ENCOUNTER — Encounter: Payer: Self-pay | Admitting: *Deleted

## 2018-05-10 ENCOUNTER — Other Ambulatory Visit: Payer: Self-pay

## 2018-05-10 ENCOUNTER — Encounter: Payer: Self-pay | Admitting: Gastroenterology

## 2018-05-10 ENCOUNTER — Ambulatory Visit (INDEPENDENT_AMBULATORY_CARE_PROVIDER_SITE_OTHER): Payer: Medicare Other | Admitting: Gastroenterology

## 2018-05-10 VITALS — Ht 69.0 in | Wt 180.0 lb

## 2018-05-10 DIAGNOSIS — Z8719 Personal history of other diseases of the digestive system: Secondary | ICD-10-CM | POA: Diagnosis not present

## 2018-05-10 DIAGNOSIS — R1031 Right lower quadrant pain: Secondary | ICD-10-CM | POA: Diagnosis not present

## 2018-05-10 DIAGNOSIS — R945 Abnormal results of liver function studies: Secondary | ICD-10-CM | POA: Diagnosis not present

## 2018-05-10 DIAGNOSIS — R198 Other specified symptoms and signs involving the digestive system and abdomen: Secondary | ICD-10-CM | POA: Diagnosis not present

## 2018-05-10 DIAGNOSIS — K831 Obstruction of bile duct: Secondary | ICD-10-CM

## 2018-05-10 DIAGNOSIS — R7989 Other specified abnormal findings of blood chemistry: Secondary | ICD-10-CM

## 2018-05-10 DIAGNOSIS — R1032 Left lower quadrant pain: Secondary | ICD-10-CM

## 2018-05-10 DIAGNOSIS — G8929 Other chronic pain: Secondary | ICD-10-CM

## 2018-05-10 DIAGNOSIS — D649 Anemia, unspecified: Secondary | ICD-10-CM

## 2018-05-10 DIAGNOSIS — Z9889 Other specified postprocedural states: Secondary | ICD-10-CM

## 2018-05-10 NOTE — Progress Notes (Signed)
[facility] VISIT   Primary Care Provider Isaac Bliss, Rayford Halsted, MD [place] [address] [phone]  Patient Profile: [name] is a 78 y.o. male with a pmh significant for CAD, COPD, MDD, hypertension, hyperlipidemia, bladder cancer, sleep apnea, restless leg syndrome, Barrett's esophagus, chronic abdominal pain, recent pancreatitis-query gallstone related, status post sphincterotomy with biliary stent in place due to poor flow.  The patient presents to the [facility] for an evaluation and management of problem(s) noted below:  Problem List 1. Biliary stricture   2. History of pancreatitis   3. Chronic bilateral lower abdominal pain   4. Abnormal LFTs   5. Abnormal findings on esophagogastroduodenoscopy (EGD)   6. History of ERCP   7. Anemia, unspecified type     History of Present Illness I connected with  [name] on 05/12/18. I verified that I was speaking with the correct person using two identifiers. Due to the COVID-19 Pandemic, this service was provided via telemedicine using Audio/Visual Media but during the visit we lost media and proceeded with just an audio only visit to provide timely and excellent care. The patient was located at home. The provider was located in the office. The patient did consent to this visit and is aware of charges through their insurance as well as the limitations of evaluation and management by telemedicine. Other persons participating in this telemedicine service were patient's daughter.  Please see initial consultation note from Dr. Havery Moros for full details of HPI and work-up prior to recent hospitalization.  Patient was admitted to the hospital in March in the setting of a bout of pancreatitis of unclear etiology.  Imaging was performed at the time including a CT abdomen pelvis as well as MRI/MRCP.  The patient developed gram-negative rod sepsis with  ESBL bacteria.  No other source for possible bacteremia was found other than the possibility of the biliary tract.  The MRI showed evidence of gallbladder distention and mild pericholecystic fluid with findings of pancreatitis as well as intra-/extrahepatic biliary ductal dilation and a possible distal stricture with a normal pancreatic duct.  Inflammatory changes involving the stomach suggesting possible peptic ulcer disease were also found.  The patient underwent an EUS and subsequent ERCP.  The EUS had results as below but briefly showed some white numular lesions on the esophageal mucosa some gastric atrophy as well as nonbleeding erosive gastropathy and a very friable pylorus and normal duodenum.  EUS showed pancreatic parenchymal abnormalities consisting of echogenicity and heterogeneity but otherwise normal pancreatic duct and dilation of the CBD and common hepatic duct with suggestion of a stricture in the distal third of the main bile duct there was some sludge material proximal to the stricture.  An ERCP was performed and that showed ERCP showed a biliary stricture with removal of stage and debris after sphincterotomy and plastic stent placement.  Atypical cells were found on the biliary brushings.  As result of the atypical brushings surgery felt uncomfortable with moving forward with a cholecystectomy until further information was guided.  He was discharged.  He completed his antibiotics.  Interval History This is a scheduled follow-up.  The patient describes still having discomfort similar to what he had when he initially met Dr. Havery Moros.  He describes a chronic abdominal pain that he has had for at least the last 5 to 6 years that occurs in his lower abdomen mostly in his suprapubic region.  This has not changed.  He  underwent an upper and lower endoscopy as well as cross-sectional imaging to help guide Dr. Havery Moros for evaluation of this but nothing was found.  The patient states he is having  new discomfort in his lower esophagus/upper abdominal region.  He describes having a spasm-like discomfort that comes and goes within just a few seconds.  There is no relation to food intake or fasting.  Patient's weight has been steady since his discharge.  He describes no nausea or vomiting.  He has been taking oxycodone for his back pain issues.  He reports continued weakness.  Documentation in the chart does not show all of the home health labs that have been performed recently but his daughter who is a nurse describes being told that his hemoglobin and his kidney function was improving on a daily basis.  GI Review of Systems Positive as above Negative for odynophagia, bloating, melena, hematochezia  Review of Systems General: Denies fevers/chills/weight loss HEENT: Denies oral lesions Cardiovascular: Denies chest pain Pulmonary: Denies shortness of breath Gastroenterological: See HPI Genitourinary: Denies darkened urine Hematological: Denies easy bruising/bleeding Dermatological: Denies jaundice Psychological: Mood is anxious to get to the bottom of his discomforts that have plagued him for years now   Medications Current Outpatient Medications  Medication Sig Dispense Refill  . acetaminophen (TYLENOL) 325 MG tablet Take 650 mg by mouth every 6 (six) hours as needed for mild pain.    [name] aspirin 81 MG tablet Take 81 mg by mouth daily.      . cyanocobalamin (,VITAMIN B-12,) 1000 MCG/ML injection INJECT 1 ML (CC) INTRAMUSCULARLY FOR 1 DOSE (Patient taking differently: Inject 1,000 mcg into the muscle every 30 (thirty) days. ) 6 mL 3  . dicyclomine (BENTYL) 10 MG capsule Take 1 capsule (10 mg total) by mouth every 8 (eight) hours as needed for spasms. 3060 capsule 1  . ezetimibe (ZETIA) 10 MG tablet TAKE 1 TABLET BY MOUTH ONCE DAILY (Patient taking differently: Take 10 mg by mouth daily. ) 90 tablet 2  . folic acid (FOLVITE) 758 MCG tablet Take 400 mcg by mouth daily.      [name] gabapentin  (NEURONTIN) 600 MG tablet Take 1 tablet (600 mg total) by mouth 3 (three) times daily. (Patient taking differently: Take 600 mg by mouth 2 (two) times daily. ) 90 tablet 1  . isosorbide mononitrate (IMDUR) 60 MG 24 hr tablet TAKE 1 TABLET BY MOUTH ONCE DAILY (Patient taking differently: Take 60 mg by mouth daily. ) 90 tablet 3  . NEEDLE, DISP, 25 G (B-D DISP NEEDLE 25GX1") 25G X 1" MISC Inject 1000 mcg into muscle once a month. 50 each 0  . nitroGLYCERIN (NITROSTAT) 0.4 MG SL tablet Place 1 tablet (0.4 mg total) under the tongue every 5 (five) minutes as needed for chest pain. 30 tablet 3  . oxyCODONE-acetaminophen (PERCOCET) 10-325 MG tablet Take 1 tablet by mouth every 6 (six) hours as needed. 120 tablet 0  . pantoprazole (PROTONIX) 40 MG tablet Take 1 tablet (40 mg total) by mouth daily. 30 tablet 3  . Probiotic Product (PROBIOTIC ADVANCED PO) Take 1 tablet by mouth daily.    [name] rOPINIRole (REQUIP) 2 MG tablet Take 1 tablet (2 mg total) by mouth at bedtime. 90 tablet 1  . rosuvastatin (CRESTOR) 20 MG tablet TAKE 1 TABLET BY MOUTH ONCE DAILY (Patient taking differently: Take 20 mg by mouth daily. ) 90 tablet 0  . venlafaxine (EFFEXOR) 75 MG tablet Take 1 tablet (75 mg total) by mouth daily.  90 tablet 1   No current facility-administered medications for this visit.     Allergies Allergies  Allergen Reactions  . Losartan Potassium Other (See Comments)    Hyperkalemia  . Tape Rash    Itching, reddened skin Itching, reddened skin    Histories Past Medical History:  Diagnosis Date  . Allergy   . B12 deficiency anemia   . Blood transfusion without reported diagnosis   . CAD (coronary artery disease)   . Colon polyps   . COPD (chronic obstructive pulmonary disease) ([place])   . Depression   . Esophagus, Barrett's   . GERD (gastroesophageal reflux disease)   . History of bladder cancer    Bladder cancer "8 times"  . Hyperlipidemia   . Hypertension   . Localized osteoarthrosis, lower leg    . Restless leg syndrome   . Sleep apnea   . Stenosis of esophagus    Past Surgical History:  Procedure Laterality Date  . BILIARY BRUSHING  04/01/2018   Procedure: BILIARY BRUSHING;  Surgeon: Rush Landmark Telford Nab., MD;  Location: [facility];  Service: Gastroenterology;;  . BILIARY STENT PLACEMENT  04/01/2018   Procedure: BILIARY STENT PLACEMENT;  Surgeon: Irving Copas., MD;  Location: [facility];  Service: Gastroenterology;;  . BIOPSY  04/01/2018   Procedure: BIOPSY;  Surgeon: Irving Copas., MD;  Location: [facility];  Service: Gastroenterology;;  . bladder cancer    . CERVICAL DISCECTOMY     ACDF  . COLONOSCOPY  11/17/2005   normal   . CORONARY ARTERY BYPASS GRAFT     x4  . ENDOSCOPIC RETROGRADE CHOLANGIOPANCREATOGRAPHY (ERCP) WITH PROPOFOL N/A 04/01/2018   Procedure: ENDOSCOPIC RETROGRADE CHOLANGIOPANCREATOGRAPHY (ERCP) WITH PROPOFOL;  Surgeon: Rush Landmark Telford Nab., MD;  Location: [facility];  Service: Gastroenterology;  Laterality: N/A;  . ESOPHAGOGASTRODUODENOSCOPY  04/29/2010  . ESOPHAGOGASTRODUODENOSCOPY (EGD) WITH PROPOFOL N/A 04/01/2018   Procedure: ESOPHAGOGASTRODUODENOSCOPY (EGD) WITH PROPOFOL;  Surgeon: Rush Landmark Telford Nab., MD;  Location: [facility];  Service: Gastroenterology;  Laterality: N/A;  . EUS  04/01/2018   Procedure: FULL UPPER ENDOSCOPIC ULTRASOUND (EUS) RADIAL;  Surgeon: Irving Copas., MD;  Location: [facility];  Service: Gastroenterology;;  . I&D EXTREMITY Left 11/24/2016   Procedure: IRRIGATION AND DEBRIDEMENT LEFT HAND, THUMB, INDEX, MIDDLE, RING, AND SMALL FINGERS WITH RECONSTRUCTION;  Surgeon: Roseanne Kaufman, MD;  Location: [facility];  Service: Orthopedics;  Laterality: Left;  . KNEE ARTHROSCOPY Left   . LUMBAR LAMINECTOMY     and fusion x 2  . NASAL SINUS SURGERY    . POPLITEAL SYNOVIAL CYST EXCISION    . REMOVAL OF STONES  04/01/2018   Procedure: REMOVAL OF STONES;  Surgeon: Rush Landmark Telford Nab., MD;   Location: [facility];  Service: Gastroenterology;;  . Joan Mayans  04/01/2018   Procedure: Joan Mayans;  Surgeon: Mansouraty, Telford Nab., MD;  Location: [facility];  Service: Gastroenterology;;   Social History   Socioeconomic History  . Marital status: Married    Spouse name: Not on file  . Number of children: Not on file  . Years of education: Not on file  . Highest education level: Not on file  Occupational History  . Not on file  Social Needs  . Financial resource strain: Not on file  . Food insecurity:    Worry: Not on file    Inability: Not on file  . Transportation needs:    Medical: Not on file    Non-medical: Not on file  Tobacco Use  . Smoking status: Former Smoker  Last attempt to quit: 03/30/1976    Years since quitting: 42.1  . Smokeless tobacco: Never Used  . Tobacco comment: quit in 1978  Substance and Sexual Activity  . Alcohol use: No    Alcohol/week: 0.0 standard drinks  . Drug use: No  . Sexual activity: Yes  Lifestyle  . Physical activity:    Days per week: Not on file    Minutes per session: Not on file  . Stress: Not on file  Relationships  . Social connections:    Talks on phone: Not on file    Gets together: Not on file    Attends religious service: Not on file    Active member of club or organization: Not on file    Attends meetings of clubs or organizations: Not on file    Relationship status: Not on file  . Intimate partner violence:    Fear of current or ex partner: Not on file    Emotionally abused: Not on file    Physically abused: Not on file    Forced sexual activity: Not on file  Other Topics Concern  . Not on file  Social History Narrative  . Not on file   Family History  Problem Relation Age of Onset  . Melanoma Mother   . Stroke Father   . Hypertension Father   . Coronary artery disease Other   . Colon cancer Neg Hx   . Esophageal cancer Neg Hx   . Stomach cancer Neg Hx   . Rectal cancer Neg Hx   .  Pancreatic cancer Neg Hx   . Liver disease Neg Hx   . Inflammatory bowel disease Neg Hx    I have reviewed his medical, social, and family history in detail and updated the electronic medical record as necessary.    PHYSICAL EXAMINATION  Telehealth visit But no significant issues on visual media today   REVIEW OF DATA  I reviewed the following data at the time of this encounter:  GI Procedures and Studies  March 2020 EUS EGD Impression: - White nummular lesions on esophageal mucosa. Biopsied for Candida rule out. - Gastric mucosal atrophy at fundus. Biopsied. - Non-bleeding erosive gastropathy. Biopsied for HP. - Congested, erythematous, friable (with contact bleeding), granular, inflamed and texture changed mucosa in the pylorus. Biopsied to rule out adenoma or other processes.. - No gross lesions in the duodenal bulb, in the first portion of the duodenum and in the second portion of the duodenum. - Ampulla was flat. No bile was able to be suctioned out. EUS Impression: - Pancreatic parenchymal abnormalities consisting of diffuse echogenicity were noted in the genu of the pancreas, pancreatic body and pancreatic tail. The head of the pancreas due to the heterogeneity was not grossly abnormal however, the chance of missing subtle lesions with decreased sensitivity in setting of acute pancreatitis is possible. - The pancreatic duct had a normal endosonographic appearance in the genu of the pancreas, body of the pancreas and tail of the pancreas. - There was dilation in the common bile duct and in the common hepatic duct. - There was a suggestion of a stricture in the lower third of the main bile duct. - Hyperechoic material consistent with sludge was visualized endosonographically in the common bile duct above the likely stricture. - Hyperechoic material consistent with sludge was visualized endosonographically in the Gallbladder.  March 2020 ERCP - The major papilla appeared  to be flat. - The fluoroscopic examination was suspicious for sludge. -  A single moderate biliary stricture was found in the lower third of the main bile duct. The stricture was indeterminate. This was brushed for cytology. - A biliary sphincterotomy was performed. - The biliary tree was swept and sludge and debris were found. - One plastic biliary stent was placed into the common bile duct and allowed good drainage at the end of the procedure of the biliary tree suggestive that stenosis was significant.  Laboratory Studies  Reviewed in epic  Imaging Studies  March 2020 MRI/MRCP IMPRESSION: 1. MR findings consistent with mild acute uncomplicated pancreatitis. 2. Mild gallbladder distension and mild pericholecystic fluid but no gallbladder wall thickening or other definite findings for acute cholecystitis. 3. Stable appearing intra and extrahepatic biliary dilatation. Possible distal stricture. No common bile duct stones are identified. Normal main pancreatic duct. 4. Possible inflammatory changes involving the stomach which could suggest peptic ulcer disease or gastritis.  March 2020 CT abdomen pelvis with contrast IMPRESSION: 1. Findings compatible with acute pancreatitis. No necrosis or acute peripancreatic collection identified at this time. 2. Increased dilatation of the common bile duct measuring up to 15 mm at the liver hilum and opacified common bile duct near the ampulla. No main duct dilatation of the pancreas. Findings may represent stricture, obstructing mass, or choledocholithiasis. This can be further evaluated with MRI/MRCP without and with contrast.   ASSESSMENT  Mr. Kenley is a 78 y.o. male with a pmh significant for CAD, COPD, MDD, hypertension, hyperlipidemia, bladder cancer, sleep apnea, restless leg syndrome, Barrett's esophagus, chronic abdominal pain, recent pancreatitis-query gallstone related, status post sphincterotomy with biliary stent in place due  to poor flow.  The patient is seen today for evaluation and management of:  1. Biliary stricture   2. History of pancreatitis   3. Chronic bilateral lower abdominal pain   4. Abnormal LFTs   5. Abnormal findings on esophagogastroduodenoscopy (EGD)   6. History of ERCP   7. Anemia, unspecified type    The patient's presentation of pancreatitis with subsequent ESBL bacteremia was most concerning for biliary pancreatitis in the setting of his liver test abnormalities and no other source for potential ESBL bacteremia.  We that being said he does have a stricture at the distal portion of his bile duct and this returned on brushings as atypical.  I suspect that this is more likely inflammatory driven rather than an underlying pancreatic cancer or cholangiocarcinoma but we must be thoughtful and thorough.  The patient will benefit from repeat imaging now a few weeks post his recent pancreatitis.  He is able to eat but is still complaining of his lower abdominal discomfort which has had chronically.  I do not suspect that he has ongoing pancreatitis.  He needs to get a cross-sectional CT and I would like to do this as a pancreas protocol CT abdomen.  His creatinine based on his primary care doctor's note had improved to less than 1.2 and thus I think it is reasonable to do this with contrast.  I think that he would then benefit from a repeat endoscopic evaluation including an EUS and ERCP.  I will plan to biopsy the strictured area and replace a stent into that area as well.  Cholangioscopy I am not sure is necessary at this point in time but we will do repeat brushings as well.  I do not think this is a pancreatic cancer based on my prior EUS though the heterogeneity from having underlying pancreatitis can decrease the sensitivity of finding subtle lesions.  I described and discussed my concerns about the possibility of an underlying precancerous or cancerous process that we need to understand before his  gallbladder is removed.  I do think that gallbladder resection is still something we will need to consider however if he has or maintains a biliary stricture we may need to consider a combined procedure or [place].  Hopefully not.  Laboratories will be performed as outlined below.  The risks of EUS including bleeding, infection, aspiration pneumonia and intestinal perforation were discussed as was the possibility it may not give a definitive diagnosis.  If a biopsy of the pancreas is done as part of the EUS, there is an additional risk of pancreatitis at the rate of about 1%.  It was explained that procedure related pancreatitis is typically mild, although can be severe and even life threatening, which is why we do not perform random pancreatic biopsies and only biopsy a lesion we feel is concerning enough to warrant the risk.  The risks of an ERCP were discussed at length, including but not limited to the risk of perforation, bleeding, abdominal pain, post-ERCP pancreatitis (while usually mild can be severe and even life threatening).  The risks and benefits of endoscopic evaluation were discussed with the patient; these include but are not limited to the risk of perforation, infection, bleeding, missed lesions, lack of diagnosis, severe illness requiring hospitalization, as well as anesthesia and sedation related illnesses.  The patient and daughter are agreeable to proceed.    PLAN  Laboratories as outlined below Pancreas protocol CT abdomen/pelvis Plan for EUS/ERCP within 1 to 2 weeks after CT pancreas protocol was performed Evaluation for chronic abdominal pain will continue after further management of his biliary issues at present Still believe that patient will benefit from a cholecystectomy but will need to try to show that this patient does not have an underlying cancerous process first   Orders Placed This Encounter  Procedures  . CT Abdomen Pelvis W Contrast  . CBC  . Comp Met (CMET)  .  B12  . Folate  . INR/PT  . Sedimentation rate  . CRP High sensitivity    New Prescriptions   No medications on file   Modified Medications   No medications on file    Planned Follow Up No follow-ups on file.   Justice Britain, MD [facility] Office # [phone]

## 2018-05-10 NOTE — Patient Instructions (Addendum)
If you are age 76 or older, your body mass index should be between 23-30. Your Body mass index is 26.58 kg/m. If this is out of the aforementioned range listed, please consider follow up with your Primary Care Provider.  If you are age 13 or younger, your body mass index should be between 19-25. Your Body mass index is 26.58 kg/m. If this is out of the aformentioned range listed, please consider follow up with your Primary Care Provider.   It has been recommended to you by your physician that you have a(n) EUS/ERCP in hospital in 5 weeks completed. We did not schedule the procedure(s) today due to Covid-19. Please contact our office at [phone] should you decide to have the procedure completed.  You will also need a CT scan done in the next 2-3 weeks. A order has been sent to [facility] and they will contact you to schedule that. If you have not heard from them within a week you make contact them at [phone].  I have placed you on  Wait-list for your procedures  and will contact you once I am to schedule your procedures. Should you not hear from me within a 2-3 week timeframe please contact our office  at [phone].    I have also requested your most recent labs from [facility].  Thank you for choosing me and [facility].  Dr. Rush Landmark

## 2018-05-12 DIAGNOSIS — Z9889 Other specified postprocedural states: Secondary | ICD-10-CM | POA: Insufficient documentation

## 2018-05-12 DIAGNOSIS — D649 Anemia, unspecified: Secondary | ICD-10-CM | POA: Insufficient documentation

## 2018-05-12 DIAGNOSIS — R945 Abnormal results of liver function studies: Secondary | ICD-10-CM | POA: Insufficient documentation

## 2018-05-12 DIAGNOSIS — Z8719 Personal history of other diseases of the digestive system: Secondary | ICD-10-CM | POA: Insufficient documentation

## 2018-05-12 DIAGNOSIS — R7989 Other specified abnormal findings of blood chemistry: Secondary | ICD-10-CM | POA: Insufficient documentation

## 2018-05-12 DIAGNOSIS — R198 Other specified symptoms and signs involving the digestive system and abdomen: Secondary | ICD-10-CM | POA: Insufficient documentation

## 2018-05-16 ENCOUNTER — Telehealth: Payer: Self-pay

## 2018-05-16 NOTE — Telephone Encounter (Signed)
-----   Message from Marlon Pel, RN sent at 05/12/2018 12:01 PM EDT ----- Regarding: FW: CT scan See note from [facility] ----- Message ----- From: Irving Copas., MD Sent: 05/12/2018  11:33 AM EDT To: Timothy Lasso, RN Subject: CT scan                                        Andren Bethea or covering RN,We ordered a CT abdomen pelvis with contrast.  The patient needs to have a CT abdomen/pelvis pancreas protocol performed which is slightly different.   Can we ensure that the CT scan is pancreas protocol as per radiology? Let me know if there are any issues.Thank you.GM

## 2018-05-16 NOTE — Telephone Encounter (Signed)
I called and confirmed that the CT will be pancreatic protocol with Miriam at GI.

## 2018-05-17 ENCOUNTER — Telehealth: Payer: Self-pay | Admitting: Cardiovascular Disease

## 2018-05-17 DIAGNOSIS — M5416 Radiculopathy, lumbar region: Secondary | ICD-10-CM | POA: Diagnosis not present

## 2018-05-17 DIAGNOSIS — M48062 Spinal stenosis, lumbar region with neurogenic claudication: Secondary | ICD-10-CM | POA: Diagnosis not present

## 2018-05-18 ENCOUNTER — Other Ambulatory Visit (INDEPENDENT_AMBULATORY_CARE_PROVIDER_SITE_OTHER): Payer: Medicare Other

## 2018-05-18 ENCOUNTER — Telehealth: Payer: Self-pay | Admitting: Cardiovascular Disease

## 2018-05-18 DIAGNOSIS — R1031 Right lower quadrant pain: Secondary | ICD-10-CM

## 2018-05-18 DIAGNOSIS — R1032 Left lower quadrant pain: Secondary | ICD-10-CM

## 2018-05-18 DIAGNOSIS — Z8719 Personal history of other diseases of the digestive system: Secondary | ICD-10-CM | POA: Diagnosis not present

## 2018-05-18 DIAGNOSIS — R7989 Other specified abnormal findings of blood chemistry: Secondary | ICD-10-CM

## 2018-05-18 DIAGNOSIS — K831 Obstruction of bile duct: Secondary | ICD-10-CM

## 2018-05-18 DIAGNOSIS — Z9889 Other specified postprocedural states: Secondary | ICD-10-CM

## 2018-05-18 DIAGNOSIS — R945 Abnormal results of liver function studies: Secondary | ICD-10-CM

## 2018-05-18 DIAGNOSIS — R198 Other specified symptoms and signs involving the digestive system and abdomen: Secondary | ICD-10-CM

## 2018-05-18 DIAGNOSIS — G8929 Other chronic pain: Secondary | ICD-10-CM

## 2018-05-18 LAB — COMPREHENSIVE METABOLIC PANEL
ALT: 8 U/L (ref 0–53)
AST: 14 U/L (ref 0–37)
Albumin: 3.9 g/dL (ref 3.5–5.2)
Alkaline Phosphatase: 93 U/L (ref 39–117)
BUN: 15 mg/dL (ref 6–23)
CO2: 29 mEq/L (ref 19–32)
Calcium: 8.8 mg/dL (ref 8.4–10.5)
Chloride: 104 mEq/L (ref 96–112)
Creatinine, Ser: 1.46 mg/dL (ref 0.40–1.50)
GFR: 46.94 mL/min — ABNORMAL LOW (ref >60.00)
Glucose, Bld: 171 mg/dL — ABNORMAL HIGH (ref 70–99)
Potassium: 4.5 mEq/L (ref 3.5–5.1)
Sodium: 140 mEq/L (ref 135–145)
Total Bilirubin: 0.4 mg/dL (ref 0.2–1.2)
Total Protein: 7.3 g/dL (ref 6.0–8.3)

## 2018-05-18 LAB — FOLATE: Folate: >23.3 ng/mL (ref >5.9)

## 2018-05-18 LAB — CBC
HCT: 35.4 % — ABNORMAL LOW (ref 39.0–52.0)
Hemoglobin: 12.2 g/dL — ABNORMAL LOW (ref 13.0–17.0)
MCHC: 34.5 g/dL (ref 30.0–36.0)
MCV: 85.3 fl (ref 78.0–100.0)
Platelets: 147 10*3/uL — ABNORMAL LOW (ref 150.0–400.0)
RBC: 4.15 Mil/uL — ABNORMAL LOW (ref 4.22–5.81)
RDW: 15 % (ref 11.5–15.5)
WBC: 6.5 10*3/uL (ref 4.0–10.5)

## 2018-05-18 LAB — PROTIME-INR
INR: 1.1 ratio — ABNORMAL HIGH (ref 0.8–1.0)
Prothrombin Time: 12.7 s (ref 9.6–13.1)

## 2018-05-18 LAB — HIGH SENSITIVITY CRP: CRP, High Sensitivity: 1.26 mg/L (ref 0.000–5.000)

## 2018-05-18 LAB — VITAMIN B12: Vitamin B-12: 696 pg/mL (ref 211–911)

## 2018-05-18 LAB — SEDIMENTATION RATE: Sed Rate: 17 mm/hr (ref 0–20)

## 2018-05-18 NOTE — Telephone Encounter (Signed)
Spoke to pt daughter. Will use her smartphone for video visit with Dr. Claiborne Billings tomorrow at 1:20 pm. She stated that she will have BP, HR and weight available at time of appointment. Explained D[employer]Me for video visit and that someone will call ahead of time to get vitals and review meds. Pt daughter verbalized thanks and understanding.

## 2018-05-19 ENCOUNTER — Telehealth (INDEPENDENT_AMBULATORY_CARE_PROVIDER_SITE_OTHER): Payer: Medicare Other | Admitting: Cardiovascular Disease

## 2018-05-19 VITALS — Ht 69.0 in | Wt 180.0 lb

## 2018-05-19 DIAGNOSIS — I2510 Atherosclerotic heart disease of native coronary artery without angina pectoris: Secondary | ICD-10-CM | POA: Diagnosis not present

## 2018-05-19 DIAGNOSIS — I519 Heart disease, unspecified: Secondary | ICD-10-CM | POA: Diagnosis not present

## 2018-05-19 DIAGNOSIS — K8309 Other cholangitis: Secondary | ICD-10-CM

## 2018-05-19 DIAGNOSIS — Z951 Presence of aortocoronary bypass graft: Secondary | ICD-10-CM

## 2018-05-19 DIAGNOSIS — I5189 Other ill-defined heart diseases: Secondary | ICD-10-CM

## 2018-05-19 DIAGNOSIS — I10 Essential (primary) hypertension: Secondary | ICD-10-CM

## 2018-05-19 DIAGNOSIS — E785 Hyperlipidemia, unspecified: Secondary | ICD-10-CM

## 2018-05-19 DIAGNOSIS — N179 Acute kidney failure, unspecified: Secondary | ICD-10-CM

## 2018-05-19 DIAGNOSIS — I350 Nonrheumatic aortic (valve) stenosis: Secondary | ICD-10-CM

## 2018-05-19 DIAGNOSIS — R0609 Other forms of dyspnea: Secondary | ICD-10-CM

## 2018-05-19 DIAGNOSIS — R0600 Dyspnea, unspecified: Secondary | ICD-10-CM

## 2018-05-19 NOTE — Progress Notes (Signed)
Virtual Visit via Video Note   This visit type was conducted due to national recommendations for restrictions regarding the COVID-19 Pandemic (e.g. social distancing) in an effort to limit this patient's exposure and mitigate transmission in our community.  Due to his co-morbid illnesses, this patient is at least at moderate risk for complications without adequate follow up.  This format is felt to be most appropriate for this patient at this time.  All issues noted in this document were discussed and addressed.  A limited physical exam was performed with this format.  Please refer to the patient's chart for his consent to telehealth for [facility].   Evaluation Performed:  Follow-up visit  Date:  05/19/2018   ID:  [name], [name] 1940-12-31, MRN [id number]  Patient Location: Home Provider Location: Home  PCP:  Isaac Bliss, Rayford Halsted, MD  Cardiologist:  [name], MD  Electrophysiologist:  None   Chief Complaint:  F/U visit with dyspnea  History of Present Illness:    [name] is a 78 y.o. male who has known CAD and underwent CABG revascularization surgery in 2001 with a LIMA to his LAD, vein to the marginal, vein to the RCA. Catheterization in 2004 showed patent grafts. An echo Doppler study in August 2012 revealed an ejection fraction of 40-45% with moderate septal hypokinesis with grade 1 diastolic dysfunction as well as aortic valve sclerosis. Additional problems include GERD, mild emphysema, as well as obstructive sleep apnea. He no longer utilizes CPAP. He has lost approximately 25 pounds since his wife's death from a motor vehicle accident in January 2013.  He remains active and notes mild shortness of breath with activity particularly when walking up hills but this has not changed.  He also is a history of restless leg syndrome and has benefited with requip. Laboratory in 2013 demonstrated LDL 57 with LDL particle #1035, small LDL particle number was 611,  slightly increased, HDLC was 35 triglycerides 93 total cholesterol 111 initial resistance course slightly elevated at 53.  BP blood work in March 2015 revealed normal renal function.  Fasting glucose was minimally increased at 106.  Total cholesterol was 116 triglycerides 80, and LDL 66, with an HDL of 34.  LDL particle number was slightly increased at 1190.  He undewent a five-year follow-up nuclear perfusion study in November 2016.  Ejection fraction was 54%.  He had normal perfusion.  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  He admits to arthralgias without myalgias.  He has issues with his in his legs and his peripheral neuropathy.  I further titrated his losartan to 75 mg and Bystolic to 10 mg.  He believes his blood pressure and pulse have been better.  He admits to leg discomfort which seems worse with atorvastatin.  When I last saw him, he had noticed some shortness of breath with uphill walking and with exertion but denied recurrent episodes of chest tightness.   He denied PND, orthopnea.  He underwent a follow-up echo Doppler study on 10/28/2016.  Ejection fraction was excellent at 60-65%.  There was grade 2 diastolic dysfunction.  There was evidence for aortic sclerosis without stenosis.  There was mild LA dilation.  He had trivial PR.     When I last saw him in November 2019, he  noticed progressive increase in shortness of breath and this seems to develop even trying to walk up very small hill.  He denied any definitive chest tightness.  He has started to run out of some of his chronic pain meds which had been prescribed by [name].  He has not yet reestablished with another primary physician.  He was on Bystolic 10 mg and isosorbide 60 mg for his CAD and hypertension.  He was on Zetia 10 mg and rosuvastatin for hyperlipidemia.  He continues to be on chronic aspirin therapy.  He has restless legs and is on Requip.  His  previous primary physician had prescribed Effexor in addition to Percocet.  In November 2019, with his change in symptomatology I recommended a follow-up echo Doppler study and a lexiscan  Myoview.  As he had had some issues with elevated potassium on ARB therapy and I started him on HCTZ other than spironolactone.  December 23, 2017 an echo Doppler study showed an EF of 60 to 65% with grade 1 diastolic dysfunction and mild aortic valve stenosis.  A Lexiscan Myoview study on January 04, 2018 was low risk and demonstrated normal perfusion without scar or ischemia.  EF was 51%.  He was hospitalized from March 11 through April 05, 2020 presenting with abdominal discomfort,.  At time, revealed leukocytosis to 21,400, elevated lactic acid of 2.4, elevated lipase at 390 urinalysis notable for proteinuria and a creatinine had risen to 1.59 and there was mild elevation of AST and total bilirubin.  He was ultimately had positive blood cultures for coli to have sepsis and colitis.  An MRCP showed acute pancreatitis, mild gallbladder distention with peri-cholecystic fluid and gallbladder wall thickening.  School he underwent ERCP on April 01, 2018 which showed stricture and sludge without stone he underwent sphincterotomy and stent placement.  He is planned to be on antibiotics for several weeks.  During his hospitalization he developed acute kidney injury with peak creatinine at 2.27.  His medications were adjusted and on discharge creatinine was 1.75.    I saw him in follow-up of his hospitalization on April 07, 2018.  He was no longer on losartan due to his acute kidney injury.  At that time he was doing well and I suggested he reduce furosemide to 20 mg.  Laboratories showed slight additional improvement in renal function with a creatinine of 1.71.  A brain natruretic peptide was drawn and this was mildly increased at 319.  He had a  telemedicine follow-up on May 10, 2018 with Dr. Rush Landmark of GI and was still  complaining of lower esophageal/upper abdominal discomfort described as step spasm.  He is scheduled to undergo a CT of his pancreas..  From a cardiac perspective, he feels improved.  He no longer has any leg swelling.  He denies chest pain but he continues to experience his typical shortness of breath with activity.  Most recent laboratory has shown improvement in renal function with a creatinine improved to 1.46 from 1.71.  He continues to have issues with back and leg discomfort and remotely has undergone epidural steroid injections with [name].  He is unaware of any palpitations.  He denies presyncope or syncope.   The patient does not have symptoms concerning for COVID-19 infection (fever, chills, cough, or new shortness of breath).    Past Medical History:  Diagnosis Date   Allergy    B12 deficiency anemia    Blood transfusion without reported diagnosis    CAD (coronary artery disease)    Colon polyps    COPD (chronic obstructive pulmonary disease) (HCC)    Depression  Esophagus, Barrett's    GERD (gastroesophageal reflux disease)    History of bladder cancer    Bladder cancer "8 times"   Hyperlipidemia    Hypertension    Localized osteoarthrosis, lower leg    Restless leg syndrome    Sleep apnea    Stenosis of esophagus    Past Surgical History:  Procedure Laterality Date   BILIARY BRUSHING  04/01/2018   Procedure: BILIARY BRUSHING;  Surgeon: [name] Landmark Telford Nab., MD;  Location: [place];  Service: Gastroenterology;;   BILIARY STENT PLACEMENT  04/01/2018   Procedure: BILIARY STENT PLACEMENT;  Surgeon: Irving Copas., MD;  Location: [place];  Service: Gastroenterology;;   BIOPSY  04/01/2018   Procedure: BIOPSY;  Surgeon: Irving Copas., MD;  Location[facility] ENDOSCOPY;  Service: Gastroenterology;;   bladder cancer     CERVICAL DISCECTOMY     ACDF   COLONOSCOPY  11/17/2005   normal    CORONARY ARTERY BYPASS GRAFT      x4   ENDOSCOPIC RETROGRADE CHOLANGIOPANCREATOGRAPHY (ERCP) WITH PROPOFOL N/A 04/01/2018   Procedure: ENDOSCOPIC RETROGRADE CHOLANGIOPANCREATOGRAPHY (ERCP) WITH PROPOFOL;  Surgeon: Irving Copas., MD;  Location: [place];  Service: Gastroenterology;  Laterality: N/A;   ESOPHAGOGASTRODUODENOSCOPY  04/29/2010   ESOPHAGOGASTRODUODENOSCOPY (EGD) WITH PROPOFOL N/A 04/01/2018   Procedure: ESOPHAGOGASTRODUODENOSCOPY (EGD) WITH PROPOFOL;  Surgeon: [name] Landmark Telford Nab., MD;  Location: [place];  Service: Gastroenterology;  Laterality: N/A;   EUS  04/01/2018   Procedure: FULL UPPER ENDOSCOPIC ULTRASOUND (EUS) RADIAL;  Surgeon: Irving Copas., MD;  Location[facility] ENDOSCOPY;  Service: Gastroenterology;;   I&D EXTREMITY Left 11/24/2016   Procedure: IRRIGATION AND DEBRIDEMENT LEFT HAND, THUMB, INDEX, MIDDLE, RING, AND SMALL FINGERS WITH RECONSTRUCTION;  Surgeon: Roseanne Kaufman, MD;  Location: [place];  Service: Orthopedics;  Laterality: Left;   KNEE ARTHROSCOPY Left    LUMBAR LAMINECTOMY     and fusion x 2   NASAL SINUS SURGERY     POPLITEAL SYNOVIAL CYST EXCISION     REMOVAL OF STONES  04/01/2018   Procedure: REMOVAL OF STONES;  Surgeon: [name] Landmark Telford Nab., MD;  Location: [place];  Service: Gastroenterology;;   Joan Mayans  04/01/2018   Procedure: Joan Mayans;  Surgeon: Irving Copas., MD;  Location: [place];  Service: Gastroenterology;;     Current Meds  Medication Sig   acetaminophen (TYLENOL) 325 MG tablet Take 650 mg by mouth every 6 (six) hours as needed for mild pain.   aspirin 81 MG tablet Take 81 mg by mouth daily.     cyanocobalamin (,VITAMIN B-12,) 1000 MCG/ML injection INJECT 1 ML (CC) INTRAMUSCULARLY FOR 1 DOSE (Patient taking differently: Inject 1,000 mcg into the muscle every 30 (thirty) days. )   dicyclomine (BENTYL) 10 MG capsule Take 1 capsule (10 mg total) by mouth every 8 (eight) hours as needed for spasms.   ezetimibe  (ZETIA) 10 MG tablet TAKE 1 TABLET BY MOUTH ONCE DAILY (Patient taking differently: Take 10 mg by mouth daily. )   folic acid (FOLVITE) 270 MCG tablet Take 400 mcg by mouth daily.     gabapentin (NEURONTIN) 600 MG tablet Take 1 tablet (600 mg total) by mouth 3 (three) times daily. (Patient taking differently: Take 600 mg by mouth 2 (two) times daily. )   isosorbide mononitrate (IMDUR) 60 MG 24 hr tablet TAKE 1 TABLET BY MOUTH ONCE DAILY (Patient taking differently: Take 60 mg by mouth daily. )   NEEDLE, DISP, 25 G (B-D DISP NEEDLE 25GX1") 25G X 1" MISC  Inject 1000 mcg into muscle once a month.   nitroGLYCERIN (NITROSTAT) 0.4 MG SL tablet Place 1 tablet (0.4 mg total) under the tongue every 5 (five) minutes as needed for chest pain.   oxyCODONE-acetaminophen (PERCOCET) 10-325 MG tablet Take 1 tablet by mouth every 6 (six) hours as needed.   pantoprazole (PROTONIX) 40 MG tablet Take 1 tablet (40 mg total) by mouth daily.   Probiotic Product (PROBIOTIC ADVANCED PO) Take 1 tablet by mouth daily.   rOPINIRole (REQUIP) 2 MG tablet Take 1 tablet (2 mg total) by mouth at bedtime.   rosuvastatin (CRESTOR) 20 MG tablet TAKE 1 TABLET BY MOUTH ONCE DAILY (Patient taking differently: Take 20 mg by mouth daily. )   venlafaxine (EFFEXOR) 75 MG tablet Take 1 tablet (75 mg total) by mouth daily.     Allergies:   Losartan potassium and Tape   Social History   Tobacco Use   Smoking status: Former Smoker    Last attempt to quit: 03/30/1976    Years since quitting: 42.1   Smokeless tobacco: Never Used   Tobacco comment: quit in 1978  Substance Use Topics   Alcohol use: No    Alcohol/week: 0.0 standard drinks   Drug use: No     Family Hx: The patient's family history includes Coronary artery disease in an other family member; Hypertension in his father; Melanoma in his mother; Stroke in his father. There is no history of Colon cancer, Esophageal cancer, Stomach cancer, Rectal cancer,  Pancreatic cancer, Liver disease, or Inflammatory bowel disease.  ROS:   Please see the history of present illness.    He denies any fevers chills night sweats, change in taste or smell He denies wheezing.  He admits to shortness of breath with walking He denies chest tightness or pressure.  He is unaware of any rhythm abnormality Continues to have issues with his abdomen following his recent biliary stricture He is unaware of blood in his stool His edema has resolved Continues to have low back and leg discomfort and remotely has undergone epidural injections most recently in February by [name] He is sleeping adequately All other systems reviewed and are negative.   Prior CV studies:   The following studies were reviewed today:  ECHO Study Conclusions 12/23/2017  - Left ventricle: The cavity size was normal. There was mild   concentric hypertrophy. Systolic function was normal. The   estimated ejection fraction was in the range of 60% to 65%. Wall   motion was normal; there were no regional wall motion   abnormalities. Doppler parameters are consistent with abnormal   left ventricular relaxation (grade 1 diastolic dysfunction). - Aortic valve: Trileaflet; mildly thickened, mildly calcified   leaflets. There was trivial regurgitation. - Mitral valve: There was no significant regurgitation. - Left atrium: The atrium was mildly dilated. - Tricuspid valve: There was no significant regurgitation. - Pulmonic valve: There was no significant regurgitation. - Inferior vena cava: The vessel was normal in size. The   respirophasic diameter changes were in the normal range (= 50%),   consistent with normal central venous pressure.  Impressions:  - Normal LV EF, no significant valve disease.  Myoview Study Highlights 04/06/2018    The left ventricular ejection fraction is mildly decreased (45-54%).  Nuclear stress EF: 51%.  This is a low risk study.   Normal resting and  stress perfusion. No ischemia or infarction EF Estimated at 51% apex appears hypokinetic RV appears prominent     LE  Venous Dop plerSummary: 04/06/2018 Right: No evidence of common femoral vein obstruction. Left: There is no evidence of deep vein thrombosis in the lower extremity. No cystic structure found in the popliteal fossa.   Labs/Other Tests and Data Reviewed:    EKG:  An ECG dated 04/07/2018 was personally reviewed today and demonstrated:  Sinus rhythm with mild sinus arrhythmia at 71 bpm.  QTc interval 465 ms.  No significant ST changes.  No ectopy.  Recent Labs: 12/24/2017: TSH 3.000 04/04/2018: Magnesium 1.2 04/07/2018: BNP 319.7 05/18/2018: ALT 8; BUN 15; Creatinine, Ser 1.46; Hemoglobin 12.2; Platelets 147.0; Potassium 4.5; Sodium 140   Recent Lipid Panel Lab Results  Component Value Date/Time   CHOL 80 03/31/2018 05:08 AM   CHOL 99 (L) 12/24/2017 08:19 AM   CHOL 116 03/30/2013 10:53 AM   TRIG 72 03/31/2018 05:08 AM   TRIG 80 03/30/2013 10:53 AM   HDL 32 (L) 03/31/2018 05:08 AM   HDL 28 (L) 12/24/2017 08:19 AM   HDL 34 (L) 03/30/2013 10:53 AM   CHOLHDL 2.5 03/31/2018 05:08 AM   LDLCALC 34 03/31/2018 05:08 AM   LDLCALC 32 12/24/2017 08:19 AM   LDLCALC 66 03/30/2013 10:53 AM   LDLDIRECT 90.3 04/17/2008 09:11 AM    Wt Readings from Last 3 Encounters:  05/19/18 180 lb (81.6 kg)  05/10/18 180 lb (81.6 kg)  05/10/18 180 lb (81.6 kg)     Objective:    Vital Signs:  Ht 5\' 9"  (1.753 m)    Wt 180 lb (81.6 kg)    BMI 26.58 kg/m    He appears well-developed well-nourished no acute distress. Breathing is normal and unlabored. There is no audible wheezing I palpation his rhythm remained stable.  He is unaware of irregularity. He does not have any chest wall discomfort to palpation At present he does not have abdominal pain He continues to have low back pain and leg discomfort There is no leg swelling There is normal affect and cognition  ASSESSMENT & PLAN:     1. CAD: Status post CABG revascularization surgery in 2001.  Last catheterization in 2004 showed patent grafts.  I again reviewed his most recent Myoview study remains low risk and did not show significant abnormality. 2. Dyspnea with activity: This most likely is due to a combination of his previously noted diastolic dysfunction and mild aortic valve disease. He has not been able to be active and there also may be a significant component of aerobic deconditioning. 3. Essential hypertension: Reportedly his blood pressures have been fairly stable but there has been some lability. 4. Hyperlipidemia with target less than 70.  He continues to be on rosuvastatin and Zetia with most recent LDL 35. 5. Acute kidney injury: This has improved.  He is no longer on losartan.  Most recent creatinine is 1.41 6. Recent ascending cholangitis status post sphincterotomy and biliary stent placement.  Continues to have some issues abdominally and will be scheduled to undergo pancreatic CT.  COVID-19 Education: The signs and symptoms of COVID-19 were discussed with the patient and how to seek care for testing (follow up with PCP or arrange E-visit).  The importance of social distancing was discussed today.  Time:   Today, I have spent 30 minutes with the patient with telehealth technology discussing the above problems.     Medication Adjustments/Labs and Tests Ordered: Current medicines are reviewed at length with the patient today.  Concerns regarding medicines are outlined above.   Tests Ordered: No orders of the  defined types were placed in this encounter.   Medication Changes: No orders of the defined types were placed in this encounter.   Disposition:  Follow up 3 months  Signed, [name], MD  05/19/2018 1:28 PM    [facility]

## 2018-05-19 NOTE — Patient Instructions (Signed)
Medication Instructions:  The current medical regimen is effective;  continue present plan and medications.  If you need a refill on your cardiac medications before your next appointment, please call your pharmacy.   Follow-Up: At [facility], you and your health needs are our priority.  As part of our continuing mission to provide you with exceptional heart care, we have created designated Provider Care Teams.  These Care Teams include your primary Cardiologist (physician) and Advanced Practice Providers (APPs -  Physician Assistants and Nurse Practitioners) who all work together to provide you with the care you need, when you need it. You will need a follow up appointment in 3 months.  You may see Thomas Kelly, MD or one of the following Advanced Practice Providers on your designated Care Team: Hao Meng, PA-C . Angela Duke, PA-C    

## 2018-05-21 ENCOUNTER — Other Ambulatory Visit: Payer: Self-pay | Admitting: Cardiovascular Disease

## 2018-05-23 ENCOUNTER — Ambulatory Visit
Admission: RE | Admit: 2018-05-23 | Discharge: 2018-05-23 | Disposition: A | Discharge status: Home / Self Care | Payer: Medicare Other | Source: Ambulatory Visit | Attending: Gastroenterology | Admitting: Gastroenterology

## 2018-05-23 ENCOUNTER — Other Ambulatory Visit: Payer: Self-pay

## 2018-05-23 DIAGNOSIS — R945 Abnormal results of liver function studies: Secondary | ICD-10-CM

## 2018-05-23 DIAGNOSIS — G8929 Other chronic pain: Secondary | ICD-10-CM

## 2018-05-23 DIAGNOSIS — R198 Other specified symptoms and signs involving the digestive system and abdomen: Secondary | ICD-10-CM

## 2018-05-23 DIAGNOSIS — K831 Obstruction of bile duct: Secondary | ICD-10-CM

## 2018-05-23 DIAGNOSIS — R7989 Other specified abnormal findings of blood chemistry: Secondary | ICD-10-CM

## 2018-05-23 DIAGNOSIS — Z9889 Other specified postprocedural states: Secondary | ICD-10-CM

## 2018-05-23 DIAGNOSIS — R1031 Right lower quadrant pain: Secondary | ICD-10-CM

## 2018-05-23 DIAGNOSIS — R109 Unspecified abdominal pain: Secondary | ICD-10-CM | POA: Diagnosis not present

## 2018-05-23 DIAGNOSIS — Z8719 Personal history of other diseases of the digestive system: Secondary | ICD-10-CM

## 2018-05-23 DIAGNOSIS — R1032 Left lower quadrant pain: Secondary | ICD-10-CM

## 2018-05-23 MED ORDER — IOPAMIDOL (ISOVUE-300) INJECTION 61%
100.0000 mL | Freq: Once | INTRAVENOUS | Status: AC | PRN
  Administered 2018-05-23: 100 mL via INTRAVENOUS

## 2018-06-03 ENCOUNTER — Telehealth: Payer: Self-pay | Admitting: *Deleted

## 2018-06-03 NOTE — Telephone Encounter (Signed)
Webb Silversmith (nurse from [facility]) is calling to have orders for PT extended.  She called the patient for a follow up and patient complains of abdominal pain, weakness, and SOB with exercise.

## 2018-06-03 NOTE — Telephone Encounter (Signed)
Left message on machine for Anne with verbal orders to extend PT per Dr Jerilee Hoh

## 2018-06-08 ENCOUNTER — Telehealth: Payer: Self-pay | Admitting: Internal Medicine

## 2018-06-08 NOTE — Telephone Encounter (Signed)
Copied from [place] [id number]. Topic: Quick Communication - Home Health Verbal Orders >> Jun 08, 2018 12:17 PM Margot Ables wrote: Caller/Agency: Malachy Mood RN w/[facility] Callback Number: [phone], secure VM Requesting OT/PT/Skilled Nursing/Social Work/Speech Therapy: SN Frequency: spoke with pts daughter and was questioned as to why home health was ordered for pt. Please call to advise.

## 2018-06-09 NOTE — Telephone Encounter (Signed)
Spoke with daughter and HH is no longer needed.

## 2018-06-09 NOTE — Telephone Encounter (Signed)
Ok by me to order

## 2018-06-22 DIAGNOSIS — L570 Actinic keratosis: Secondary | ICD-10-CM | POA: Diagnosis not present

## 2018-06-22 DIAGNOSIS — C44319 Basal cell carcinoma of skin of other parts of face: Secondary | ICD-10-CM | POA: Diagnosis not present

## 2018-06-22 DIAGNOSIS — L821 Other seborrheic keratosis: Secondary | ICD-10-CM | POA: Diagnosis not present

## 2018-06-28 ENCOUNTER — Telehealth: Payer: Self-pay | Admitting: *Deleted

## 2018-06-28 DIAGNOSIS — R0602 Shortness of breath: Secondary | ICD-10-CM

## 2018-06-28 NOTE — Telephone Encounter (Signed)
Copied from [place] [id number]. Topic: Referral - Request for Referral >> Jun 28, 2018  9:33 AM Erick Blinks wrote: Has patient seen PCP for this complaint? Yes  *If NO, is insurance requiring patient see PCP for this issue before PCP can refer them? Referral for which specialty: Pulmonology  Preferred provider/office: Highest recommended Reason for referral: Complaints about shortness of breath and difficulty breathing   Best Contact: [phone] [name], daughter)

## 2018-06-28 NOTE — Telephone Encounter (Signed)
I am ok with referral to pulmonary, but if this is acute SOB he likely needs to be seen urgently. Can we follow up on this to determine if in office visit or ED is better? What is going on?

## 2018-06-29 DIAGNOSIS — C44329 Squamous cell carcinoma of skin of other parts of face: Secondary | ICD-10-CM | POA: Diagnosis not present

## 2018-06-29 NOTE — Telephone Encounter (Signed)
Spoke with daughter and SOB has been on going for some time now.  Referral placed.

## 2018-06-29 NOTE — Addendum Note (Signed)
Addended by: Westley Hummer B on: 06/29/2018 08:34 AM   Modules accepted: Orders

## 2018-07-05 ENCOUNTER — Telehealth: Payer: Self-pay

## 2018-07-05 ENCOUNTER — Telehealth: Payer: Self-pay | Admitting: Gastroenterology

## 2018-07-05 NOTE — Telephone Encounter (Signed)
Copied from [place] [id number] [phone]. Topic: Quick Communication - Rx Refill/Question >> Jul 05, 2018 11:13 AM Carolyn Stare wrote: Medication ezetimibe (ZETIA) 10 MG tablet  Preferred Pharmacy [place]: Please be advised that RX refills may take up to 3 business days. We ask that you follow-up with your pharmacy.

## 2018-07-05 NOTE — Telephone Encounter (Signed)
Patient daughter [name] called wanting to speak with the nurse about her fathers follow up biopsy that was discuss.

## 2018-07-05 NOTE — Telephone Encounter (Signed)
The pt daughter is aware that we will be calling him soon with an appt for his procedure.

## 2018-07-06 MED ORDER — EZETIMIBE 10 MG PO TABS: 10.0000 mg | ORAL_TABLET | Freq: Every day | ORAL | 1 refills | Status: DC

## 2018-07-18 NOTE — Telephone Encounter (Signed)
Opened in error

## 2018-07-26 NOTE — Telephone Encounter (Signed)
Pt's wife called regarding scheduling procedure.

## 2018-07-27 ENCOUNTER — Ambulatory Visit (INDEPENDENT_AMBULATORY_CARE_PROVIDER_SITE_OTHER): Payer: Medicare Other | Admitting: Internal Medicine

## 2018-07-27 ENCOUNTER — Institutional Professional Consult (permissible substitution): Payer: Medicare Other | Admitting: Internal Medicine

## 2018-07-27 ENCOUNTER — Encounter: Payer: Self-pay | Admitting: Internal Medicine

## 2018-07-27 ENCOUNTER — Other Ambulatory Visit: Payer: Self-pay

## 2018-07-27 ENCOUNTER — Ambulatory Visit (INDEPENDENT_AMBULATORY_CARE_PROVIDER_SITE_OTHER): Payer: Medicare Other

## 2018-07-27 DIAGNOSIS — R0600 Dyspnea, unspecified: Secondary | ICD-10-CM

## 2018-07-27 DIAGNOSIS — R0609 Other forms of dyspnea: Secondary | ICD-10-CM

## 2018-07-27 DIAGNOSIS — K2100 Gastro-esophageal reflux disease with esophagitis, without bleeding: Secondary | ICD-10-CM

## 2018-07-27 DIAGNOSIS — J90 Pleural effusion, not elsewhere classified: Secondary | ICD-10-CM

## 2018-07-27 MED ORDER — PANTOPRAZOLE SODIUM 40 MG PO TBEC: DELAYED_RELEASE_TABLET | ORAL | 3 refills | Status: DC

## 2018-07-27 NOTE — Assessment & Plan Note (Addendum)
Onset late 1990's  - Spirometry 10/22/09   FEV1 2.96 (86%)  With  fev1/fvc ratio 0.85 no graphics avail  - 07/27/2018   Walked RA  2 laps @  approx 26ft each @ avg pace  stopped due to  End of study, sob but not out of breath with sats still 94%  Decades of sob reported = symptoms are markedly disproportionate to objective findings and not clear to what extent this is actually a pulmonary  problem but pt does appear to have difficult to sort out respiratory symptoms of unknown origin for which  DDX  = almost all start with A and  include Adherence, Ace Inhibitors, Acid Reflux, Active Sinus Disease, Alpha 1 Antitripsin deficiency, Anxiety masquerading as Airways dz,  ABPA,  Allergy(esp in young), Aspiration (esp in elderly), Adverse effects of meds,  Active smoking or Vaping, A bunch of PE's/clot burden (a few small clots can't cause this syndrome unless there is already severe underlying pulm or vascular dz with poor reserve),  Anemia or thyroid disorder, plus two Bs  = Bronchiectasis and Beta blocker use..and one C= CHF   Adherence is always the initial "prime suspect" and is a multilayered concern that requires a "trust but verify" approach in every patient - starting with knowing how to use medications, especially inhalers, correctly, keeping up with refills and understanding the fundamental difference between maintenance and prns vs those medications only taken for a very short course and then stopped and not refilled.  - return with all meds in hand using a trust but verify approach to confirm accurate Medication  Reconciliation The principal here is that until we are certain that the  patients are doing what we've asked, it makes no sense to ask them to do more.   ? Acid (or non-acid) GERD > always difficult to exclude as up to 75% of pts in some series report no assoc GI/ Heartburn symptoms and he has h/o suspected Barretts > rec max (24h)  acid suppression and diet restrictions/ reviewed and  instructions given in writing.   ? Allergy /asthma > no variability to suggest   ? Anxiety/depression/ deconditioning  > usually at the bottom of this list of usual suspects but should be much higher on this pt's based on H and P and note already on psychotropics and may interfere with adherence and also interpretation of response or lack thereof to symptom management which can be quite subjective.   ?  A bunch of PE's > d dimer Pos with worsening effusions > needs CTa or v/q with venous dopplers to complete the w/u but the hx is not typical for pe   ? chf > bnp only a bit up and does not usually cause L effusion isolated

## 2018-07-27 NOTE — Progress Notes (Addendum)
[name], male    DOB: 02-Mar-1940     MRN: [id number]   Brief patient profile:  9 yowm quit smoking 1978 cc " nasal congestion" all his life, took shots in his 78s but never really helped then breathing problem started around late 1990s (@ his  mid 37's) > cards eval > cabg no better even p cardiac rehab and gradually worse to point where [facility] = can't walk a nl pace on a flat grade s sob but does fine slow and flat so referred to pulmonary clinic 07/27/2018 by Dr   [name]      History of Present Illness  07/27/2018  Pulmonary/ 1st office eval/[name]  Chief Complaint  Patient presents with  . Pulmonary Consult    Referred by Dr. Matthew Hughes. Pt c/o SOB for the past year. He states he gets out of breath just bending over. He also gets SOB lying flat and when he lies on his right side.   Dyspnea: MMRC2 = can't walk a nl pace on a flat grade s sob but does fine slow and flat  Cough: better p sinus surgery  Sleep: wakes up every few hours s cough or gasping for breath, dx osa > can't tolerate cpap / wakes up feeling back pain and tired  SABA use: none  250 ft gentle hill to mb but hasn't done in a year and says he think he could in pm's but "not before lunch cause I'm hurting way too bad all over"   No obvious day to day or daytime variability or assoc excess/ purulent sputum or mucus plugs or hemoptysis or cp or chest tightness, subjective wheeze - does have overt HB on ppi pc daily and possible barrett's f/u [name] planned     Sleeping  without nocturnal  or early am exacerbation  of respiratory  c/o's or need for noct saba. Also denies any obvious fluctuation of symptoms with weather or environmental changes or other aggravating or alleviating factors except as outlined above   No unusual exposure hx or h/o childhood pna/ asthma or knowledge of premature birth.  Current Allergies, Complete Past Medical History, Past Surgical History, Family History, and Social  History were reviewed in [employer] record.  ROS  The following are not active complaints unless bolded Hoarseness, sore throat, dysphagia, dental problems, itching, sneezing,  nasal congestion or discharge of excess mucus or purulent secretions, ear ache,   fever, chills, sweats, unintended wt loss or wt gain, classically pleuritic or exertional cp,  orthopnea pnd or arm/hand swelling  or leg swelling, presyncope, palpitations, abdominal pain, anorexia, nausea, vomiting, diarrhea  or change in bowel habits or change in bladder habits, change in stools or change in urine, dysuria, hematuria,  rash, arthralgias, visual complaints, headache, numbness, weakness or ataxia or problems with walking or coordination,  change in mood or  memory.             Past Medical History:  Diagnosis Date  . Allergy   . B12 deficiency anemia   . Blood transfusion without reported diagnosis   . CAD (coronary artery disease)   . Colon polyps   . COPD (chronic obstructive pulmonary disease) ([place])   . Depression   . Esophagus, Barrett's   . GERD (gastroesophageal reflux disease)   . History of bladder cancer    Bladder cancer "8 times"  . Hyperlipidemia   . Hypertension   . Localized osteoarthrosis, lower leg   .  Restless leg syndrome   . Sleep apnea   . Stenosis of esophagus     Outpatient Medications Prior to Visit  Medication Sig Dispense Refill  . acetaminophen (TYLENOL) 325 MG tablet Take 650 mg by mouth every 6 (six) hours as needed for mild pain.    [name] aspirin 81 MG tablet Take 81 mg by mouth daily.      [name] BYSTOLIC 10 MG tablet Take 10 mg by mouth daily.    . cyanocobalamin (,VITAMIN B-12,) 1000 MCG/ML injection INJECT 1 ML (CC) INTRAMUSCULARLY FOR 1 DOSE (Patient taking differently: Inject 1,000 mcg into the muscle every 30 (thirty) days. ) 6 mL 3  . dicyclomine (BENTYL) 10 MG capsule Take 1 capsule (10 mg total) by mouth every 8 (eight) hours as needed for spasms. 3060  capsule 1  . ezetimibe (ZETIA) 10 MG tablet Take 1 tablet (10 mg total) by mouth daily. 90 tablet 1  . folic acid (FOLVITE) 053 MCG tablet Take 400 mcg by mouth daily.      . isosorbide mononitrate (IMDUR) 60 MG 24 hr tablet TAKE 1 TABLET BY MOUTH ONCE DAILY (Patient taking differently: Take 60 mg by mouth daily. ) 90 tablet 3  . Multiple Vitamins-Minerals (MULTIVITAMIN WITH MINERALS) tablet Take 1 tablet by mouth daily.    [name] NEEDLE, DISP, 25 G (B-D DISP NEEDLE 25GX1") 25G X 1" MISC Inject 1000 mcg into muscle once a month. 50 each 0  . nitroGLYCERIN (NITROSTAT) 0.4 MG SL tablet Place 1 tablet (0.4 mg total) under the tongue every 5 (five) minutes as needed for chest pain. 30 tablet 3  . pantoprazole (PROTONIX) 40 MG tablet Take 1 tablet (40 mg total) by mouth daily. 30 tablet 3  . polycarbophil (FIBERCON) 625 MG tablet Take 625 mg by mouth daily.    . Probiotic Product (PROBIOTIC ADVANCED PO) Take 1 tablet by mouth daily.    [name] rOPINIRole (REQUIP) 2 MG tablet Take 1 tablet (2 mg total) by mouth at bedtime. 90 tablet 1  . rosuvastatin (CRESTOR) 20 MG tablet Take 1 tablet by mouth once daily 90 tablet 3  . venlafaxine (EFFEXOR) 75 MG tablet Take 1 tablet (75 mg total) by mouth daily. 90 tablet 1  . gabapentin (NEURONTIN) 600 MG tablet Take 1 tablet (600 mg total) by mouth 3 (three) times daily. (Patient taking differently: Take 600 mg by mouth 2 (two) times daily. ) 90 tablet 1  . oxyCODONE-acetaminophen (PERCOCET) 10-325 MG tablet Take 1 tablet by mouth every 6 (six) hours as needed. 120 tablet 0      Objective:     BP 110/68 (BP Location: Left Arm, Cuff Size: Normal)   Pulse 68   Temp 98 F (36.7 C) (Oral)   Ht 5\' 9"  (1.753 m)   Wt 176 lb (79.8 kg)   SpO2 95% Comment: on RA  BMI 25.99 kg/m   SpO2: 95 %(on RA)     amb somber wm nad tends to dwell on what he's been told rather than responding to specific questions about resp symptoms in a coherent fashion.  [name] HEENT: No upper teeth,  nl  oropharynx. Nl external ear canals without cough reflex -  Mild bilateral non-specific turbinate edema     NECK :  without JVD/Nodes/TM/ nl carotid upstrokes bilaterally   LUNGS: no acc muscle use,  Min barrel  contour chest wall with bilateral  slightly decreased bs s audible wheeze and  without cough on insp or exp maneuver and  min  Hyperresonant  to  percussion bilaterally     CV:  RRR  no s3 or murmur or increase in P2, and no edema   ABD:  soft and nontender with pos end  insp Hoover's  in the supine position. No bruits or organomegaly appreciated, bowel sounds nl  MS:   Nl gait/  ext warm without deformities, calf tenderness, cyanosis or clubbing No obvious joint restrictions   SKIN: warm and dry without lesions    NEURO:  alert, approp, nl sensorium with  no motor or cerebellar deficits apparent.        CXR PA and Lateral:   07/27/2018 :    I personally reviewed images and agree with radiology impression as follows:   1. Small left pleural effusion mildly increased in size from the prior chest radiographs. There is associated left upper lobe lingula and left lower lobe linear opacities consistent with atelectasis. 2. No evidence of pneumonia or pulmonary edema. 3. Stable changes from CABG surgery.   Labs ordered/ reviewed:      Chemistry      Component Value Date/Time   NA 139 07/27/2018 1618   NA 143 04/07/2018 1543   K 4.2 07/27/2018 1618   CL 101 07/27/2018 1618   CO2 30 07/27/2018 1618   BUN 23 07/27/2018 1618   BUN 30 (H) 04/07/2018 1543   CREATININE 1.64 (H) 07/27/2018 1618   CREATININE 1.17 12/26/2015 1034      Component Value Date/Time   CALCIUM 9.1 07/27/2018 1618   ALKPHOS 93 05/18/2018 1519   AST 14 05/18/2018 1519   ALT 8 05/18/2018 1519   BILITOT 0.4 05/18/2018 1519   BILITOT 0.4 04/07/2018 1543        Lab Results  Component Value Date   WBC 8.4 07/27/2018   HGB 12.6 (L) 07/27/2018   HCT 37.5 (L) 07/27/2018   MCV 84.4 07/27/2018    PLT 244.0 07/27/2018     Lab Results  Component Value Date   DDIMER 4.01 (H) 07/27/2018      Lab Results  Component Value Date   TSH 3.05 07/27/2018     Lab Results  Component Value Date   PROBNP 150.0 (H) 07/27/2018       Lab Results  Component Value Date   ESRSEDRATE 31 (H) 07/27/2018   ESRSEDRATE 17 05/18/2018   ESRSEDRATE 73 (H) 04/02/2018           Assessment   No problem-specific Assessment & Plan notes found for this encounter.     Christinia Gully, MD 07/27/2018

## 2018-07-27 NOTE — Patient Instructions (Signed)
Change pantoprazole to where you take it Take 30- 60 min before your first and last meals of the day   GERD (REFLUX)  is an extremely common cause of respiratory symptoms just like yours , many times with no obvious heartburn at all.    It can be treated with medication, but also with lifestyle changes including elevation of the head of your bed (ideally with 6 -8inch blocks under the headboard of your bed),  Smoking cessation, avoidance of late meals, excessive alcohol, and avoid fatty foods, chocolate, peppermint, colas, red wine, and acidic juices such as orange juice.  NO MINT OR MENTHOL PRODUCTS SO NO COUGH DROPS  USE SUGARLESS CANDY INSTEAD (Jolley ranchers or Stover's or Life Savers) or even ice chips will also do - the key is to swallow to prevent all throat clearing. NO OIL BASED VITAMINS - use powdered substitutes.  Avoid fish oil when coughing.   To get the most out of exercise, you need to be continuously aware that you are short of breath, but never out of breath, for 15- 30 minutes daily. As you improve, it will actually be easier for you to do the same amount of exercise  in  30 minutes so always push to the level where you are short of breath.     Please remember to go to the lab and x-ray department   for your tests - we will call you with the results when they are available.     Please schedule a follow up office visit in 6 weeks, call sooner if needed with all medications /inhalers/ solutions in hand so we can verify exactly what you are taking. This includes all medications from all doctors and over the counters

## 2018-07-28 ENCOUNTER — Other Ambulatory Visit: Payer: Self-pay | Admitting: Internal Medicine

## 2018-07-28 ENCOUNTER — Ambulatory Visit: Payer: Medicare Other

## 2018-07-28 ENCOUNTER — Telehealth: Payer: Self-pay | Admitting: Internal Medicine

## 2018-07-28 ENCOUNTER — Other Ambulatory Visit: Payer: Self-pay | Admitting: *Deleted

## 2018-07-28 ENCOUNTER — Ambulatory Visit (HOSPITAL_COMMUNITY)
Admission: RE | Admit: 2018-07-28 | Discharge: 2018-07-28 | Disposition: A | Discharge status: Home / Self Care | Payer: Medicare Other | Source: Ambulatory Visit | Attending: Internal Medicine | Admitting: Internal Medicine

## 2018-07-28 ENCOUNTER — Ambulatory Visit
Admission: RE | Admit: 2018-07-28 | Discharge: 2018-07-28 | Disposition: A | Discharge status: Home / Self Care | Payer: Medicare Other | Source: Ambulatory Visit | Attending: Internal Medicine | Admitting: Internal Medicine

## 2018-07-28 ENCOUNTER — Encounter: Payer: Self-pay | Admitting: Internal Medicine

## 2018-07-28 DIAGNOSIS — R058 Other specified cough: Secondary | ICD-10-CM | POA: Insufficient documentation

## 2018-07-28 DIAGNOSIS — R0609 Other forms of dyspnea: Secondary | ICD-10-CM

## 2018-07-28 DIAGNOSIS — J90 Pleural effusion, not elsewhere classified: Secondary | ICD-10-CM | POA: Insufficient documentation

## 2018-07-28 DIAGNOSIS — R0600 Dyspnea, unspecified: Secondary | ICD-10-CM

## 2018-07-28 DIAGNOSIS — R0602 Shortness of breath: Secondary | ICD-10-CM

## 2018-07-28 DIAGNOSIS — R05 Cough: Secondary | ICD-10-CM | POA: Insufficient documentation

## 2018-07-28 LAB — CBC WITH DIFFERENTIAL/PLATELET
Basophils Absolute: 0.1 10*3/uL (ref 0.0–0.1)
Basophils Relative: 1.2 % (ref 0.0–3.0)
Eosinophils Absolute: 0.3 10*3/uL (ref 0.0–0.7)
Eosinophils Relative: 3.3 % (ref 0.0–5.0)
HCT: 37.5 % — ABNORMAL LOW (ref 39.0–52.0)
Hemoglobin: 12.6 g/dL — ABNORMAL LOW (ref 13.0–17.0)
Lymphocytes Relative: 28.5 % (ref 12.0–46.0)
Lymphs Abs: 2.4 10*3/uL (ref 0.7–4.0)
MCHC: 33.6 g/dL (ref 30.0–36.0)
MCV: 84.4 fl (ref 78.0–100.0)
Monocytes Absolute: 0.7 10*3/uL (ref 0.1–1.0)
Monocytes Relative: 7.9 % (ref 3.0–12.0)
Neutro Abs: 4.9 10*3/uL (ref 1.4–7.7)
Neutrophils Relative %: 59.1 % (ref 43.0–77.0)
Platelets: 244 10*3/uL (ref 150.0–400.0)
RBC: 4.44 Mil/uL (ref 4.22–5.81)
RDW: 14.6 % (ref 11.5–15.5)
WBC: 8.4 10*3/uL (ref 4.0–10.5)

## 2018-07-28 LAB — RESPIRATORY ALLERGY PROFILE REGION II ~~LOC~~
Allergen, A. alternata, m6: <0.1 kU/L
Allergen, Cedar tree, t12: <0.1 kU/L
Allergen, Comm Silver Birch, t9: <0.1 kU/L
Allergen, Cottonwood, t14: <0.1 kU/L
Allergen, D pternoyssinus,d7: <0.1 kU/L
Allergen, Mouse Urine Protein, e78: <0.1 kU/L
Allergen, Mulberry, t76: <0.1 kU/L
Allergen, Oak,t7: <0.1 kU/L
Allergen, P. notatum, m1: <0.1 kU/L
Aspergillus fumigatus, m3: <0.1 kU/L
Bermuda Grass: <0.1 kU/L
Box Elder IgE: <0.1 kU/L
CLADOSPORIUM HERBARUM (M2) IGE: <0.1 kU/L
COMMON RAGWEED (SHORT) (W1) IGE: <0.1 kU/L
Cat Dander: 0.44 kU/L — ABNORMAL HIGH
Class: 0
Class: 0
Class: 0
Class: 0
Class: 0
Class: 0
Class: 0
Class: 0
Class: 0
Class: 0
Class: 0
Class: 0
Class: 0
Class: 0
Class: 0
Class: 0
Class: 0
Class: 0
Class: 0
Class: 0
Class: 0
Class: 0
Class: 1
Class: 1
Cockroach: <0.1 kU/L
D. farinae: <0.1 kU/L
Dog Dander: 0.55 kU/L — ABNORMAL HIGH
Elm IgE: <0.1 kU/L
IgE (Immunoglobulin E), Serum: 258 kU/L — ABNORMAL HIGH (ref <=114)
Johnson Grass: <0.1 kU/L
Pecan/Hickory Tree IgE: <0.1 kU/L
Rough Pigweed  IgE: <0.1 kU/L
Sheep Sorrel IgE: <0.1 kU/L
Timothy Grass: <0.1 kU/L

## 2018-07-28 LAB — BASIC METABOLIC PANEL
BUN: 23 mg/dL (ref 6–23)
CO2: 30 mEq/L (ref 19–32)
Calcium: 9.1 mg/dL (ref 8.4–10.5)
Chloride: 101 mEq/L (ref 96–112)
Creatinine, Ser: 1.64 mg/dL — ABNORMAL HIGH (ref 0.40–1.50)
GFR: 41.03 mL/min — ABNORMAL LOW (ref >60.00)
Glucose, Bld: 164 mg/dL — ABNORMAL HIGH (ref 70–99)
Potassium: 4.2 mEq/L (ref 3.5–5.1)
Sodium: 139 mEq/L (ref 135–145)

## 2018-07-28 LAB — SEDIMENTATION RATE: Sed Rate: 31 mm/hr — ABNORMAL HIGH (ref 0–20)

## 2018-07-28 LAB — BRAIN NATRIURETIC PEPTIDE: Pro B Natriuretic peptide (BNP): 150 pg/mL — ABNORMAL HIGH (ref 0.0–100.0)

## 2018-07-28 LAB — D-DIMER, QUANTITATIVE: D-Dimer, Quant: 4.01 mcg/mL FEU — ABNORMAL HIGH (ref <0.50)

## 2018-07-28 LAB — TSH: TSH: 3.05 u[IU]/mL (ref 0.35–4.50)

## 2018-07-28 LAB — INTERPRETATION:

## 2018-07-28 NOTE — Progress Notes (Unsigned)
vi 

## 2018-07-28 NOTE — Progress Notes (Signed)
I see that pt was already given cxr results See phone note dated 07/28/2018  Want to ensure pt obtains all of these labs results Per Davy Pique results have already been given to the pt's daughter and she understands that the dopplers and vq scan are being ordered I ATC the pt and did not get an answer and his MB is full  [facility]

## 2018-07-28 NOTE — Progress Notes (Signed)
CTa ordered.

## 2018-07-28 NOTE — Assessment & Plan Note (Signed)
First noted 06/23/18 Abd ct f/u from pancreatitis  Probably will need thoracentesis once we have ruled out PE    Discussed in detail all the  indications, usual  risks and alternatives  relative to the benefits with patient who agrees to proceed with w/u as outlined.      Total time devoted to counseling  > 50 % of initial 60 min office visit:  review case with pt/ directly observed portions of ambulatory 02 saturation study/  discussion of options/alternatives/ personally creating written customized instructions  in presence of pt  then going over those specific  Instructions directly with the pt including how to use all of the meds but in particular covering each new medication in detail and the difference between the maintenance= "automatic" meds and the prns using an action plan format for the latter (If this problem/symptom => do that organization reading Left to right).  Please see AVS from this visit for a full list of these instructions which I personally wrote for this pt and  are unique to this visit.

## 2018-07-28 NOTE — Telephone Encounter (Signed)
Returned call to patient daughter who his emergency contact. Made aware pt will need ASAP CTA due to results of cxr. She will receive a call once scheduled. Results of BMET pending. Nothing further needed.

## 2018-07-29 ENCOUNTER — Other Ambulatory Visit: Payer: Self-pay | Admitting: *Deleted

## 2018-07-29 ENCOUNTER — Other Ambulatory Visit: Payer: Self-pay | Admitting: Internal Medicine

## 2018-07-29 ENCOUNTER — Ambulatory Visit (HOSPITAL_COMMUNITY)
Admission: RE | Admit: 2018-07-29 | Discharge: 2018-07-29 | Disposition: A | Discharge status: Home / Self Care | Payer: Medicare Other | Source: Ambulatory Visit | Attending: Internal Medicine | Admitting: Internal Medicine

## 2018-07-29 ENCOUNTER — Telehealth: Payer: Self-pay | Admitting: Internal Medicine

## 2018-07-29 ENCOUNTER — Other Ambulatory Visit: Payer: Self-pay

## 2018-07-29 DIAGNOSIS — R0602 Shortness of breath: Secondary | ICD-10-CM | POA: Diagnosis not present

## 2018-07-29 DIAGNOSIS — J90 Pleural effusion, not elsewhere classified: Secondary | ICD-10-CM

## 2018-07-29 NOTE — Progress Notes (Signed)
error 

## 2018-07-29 NOTE — Telephone Encounter (Signed)
Called and spoke with [name], she missed a phone call about testing. Call was transferred to [place] to get set up for covid testing prior to thoracentesis.   Nothing further needed at this time

## 2018-07-30 ENCOUNTER — Other Ambulatory Visit: Payer: Self-pay | Admitting: Cardiovascular Disease

## 2018-08-01 ENCOUNTER — Other Ambulatory Visit (HOSPITAL_COMMUNITY)
Admission: RE | Admit: 2018-08-01 | Discharge: 2018-08-01 | Disposition: A | Discharge status: Home / Self Care | Payer: Medicare Other | Source: Ambulatory Visit | Attending: Internal Medicine | Admitting: Internal Medicine

## 2018-08-01 DIAGNOSIS — Z1159 Encounter for screening for other viral diseases: Secondary | ICD-10-CM | POA: Diagnosis not present

## 2018-08-01 NOTE — Telephone Encounter (Signed)
Patient daughter called back her CB# [phone]

## 2018-08-01 NOTE — Telephone Encounter (Signed)
Left message on machine to call back  

## 2018-08-02 LAB — SARS CORONAVIRUS 2 (TAT 6-24 HRS): SARS Coronavirus 2: NEGATIVE

## 2018-08-02 NOTE — Progress Notes (Signed)
ATC, NA and no option to leave msg 

## 2018-08-02 NOTE — Telephone Encounter (Signed)
I tried all available numbers to reach the pt- home number voice mail full Number left by the daughter to call says not in service.  Will try later

## 2018-08-03 ENCOUNTER — Other Ambulatory Visit: Payer: Self-pay

## 2018-08-03 DIAGNOSIS — K831 Obstruction of bile duct: Secondary | ICD-10-CM

## 2018-08-03 NOTE — Telephone Encounter (Signed)
The pt daughter returned call and pt was scheduled for 09/12/18 and Covid testing for 09/08/18.  Instructions mailed to the home and pt daughter verbalized understanding.

## 2018-08-03 NOTE — Telephone Encounter (Signed)
Unable to reach pt will call again tomorrow 7/16

## 2018-08-04 ENCOUNTER — Other Ambulatory Visit: Payer: Self-pay | Admitting: Internal Medicine

## 2018-08-04 ENCOUNTER — Other Ambulatory Visit: Payer: Self-pay

## 2018-08-04 ENCOUNTER — Ambulatory Visit (HOSPITAL_COMMUNITY)
Admission: RE | Admit: 2018-08-04 | Discharge: 2018-08-04 | Disposition: A | Discharge status: Home / Self Care | Payer: Medicare Other | Source: Ambulatory Visit | Attending: Internal Medicine | Admitting: Internal Medicine

## 2018-08-04 DIAGNOSIS — J90 Pleural effusion, not elsewhere classified: Secondary | ICD-10-CM | POA: Insufficient documentation

## 2018-08-04 MED ORDER — ISOSORBIDE MONONITRATE ER 60 MG PO TB24: 60.0000 mg | ORAL_TABLET | Freq: Every day | ORAL | 1 refills | Status: DC

## 2018-08-04 NOTE — Progress Notes (Signed)
IR requested by [name] for possible image-guided left thoracentesis.  Limited chest ultrasound revealed a very small amount of fluid not amendable to percutaneous drainage. Images reviewed by Dr. Pascal Lux who agrees. Informed patient that procedure will not occur today. All questions answered and concerns addressed. Will notify [name] of findings today.  IR available in future if needed.   Bea Graff Rayme Bui, PA-C 08/04/2018, 3:01 PM

## 2018-08-05 ENCOUNTER — Ambulatory Visit (HOSPITAL_COMMUNITY): Admission: RE | Admit: 2018-08-05 | Payer: Medicare Other | Source: Ambulatory Visit

## 2018-08-22 ENCOUNTER — Telehealth: Payer: Self-pay | Admitting: Internal Medicine

## 2018-08-22 NOTE — Telephone Encounter (Signed)
Medication Refill - Medication: Oxycodone  Has the patient contacted their pharmacy? Yes (Agent: If no, request that the patient contact the pharmacy for the refill.) (Agent: If yes, when and what did the pharmacy advise?)Contact PCP  Preferred Pharmacy (with phone number or street name):  [place], [place] - [address] [phone] (Phone) [phone] (Fax)     Agent: Please be advised that RX refills may take up to 3 business days. We ask that you follow-up with your pharmacy.

## 2018-08-23 NOTE — Telephone Encounter (Signed)
Pt called back and stated that dr Jerilee Hoh has filled Rx before. Pt would like a call back from the nurse regarding. Please advise   Cb# [phone]

## 2018-08-23 NOTE — Telephone Encounter (Signed)
Left message on machine  CRM

## 2018-08-23 NOTE — Telephone Encounter (Signed)
Chronic narcs need visit q 3 mo for refills. Can be virtual.

## 2018-08-23 NOTE — Telephone Encounter (Signed)
Visit scheduled °

## 2018-08-23 NOTE — Telephone Encounter (Signed)
Spoke with patient and he states that in an office visit there was an agrement to refill Oxycodone.  Office visit 04/15/2018: Chronic pain syndrome High risk medication use  -Understandably so, he has had increasing pain requirements since discharge from hospital. -He is under a pain contract with me, in fact is the only patient in my practice who is on oxycodone.  He is prescribed 10/325 mg up to 3 times a day as needed with a limit of 60 tablets a month. -I have reviewed the [place] controlled substance database today.  He has no red flags, his overdose risk score is 70. -Will allow a one-time refill of 120 tablets so he can take it 4 times a day for the next 30 days as he recovers from his hospitalization and ERCP with stenting.  Please advise

## 2018-08-23 NOTE — Telephone Encounter (Signed)
I have never prescribed this to him before. He will need an in-person visit. Also, may consider contacting original prescriber for refills.

## 2018-08-24 ENCOUNTER — Other Ambulatory Visit: Payer: Self-pay

## 2018-08-24 ENCOUNTER — Ambulatory Visit (INDEPENDENT_AMBULATORY_CARE_PROVIDER_SITE_OTHER): Payer: Medicare Other | Admitting: Internal Medicine

## 2018-08-24 DIAGNOSIS — M545 Low back pain, unspecified: Secondary | ICD-10-CM

## 2018-08-24 DIAGNOSIS — G8929 Other chronic pain: Secondary | ICD-10-CM | POA: Diagnosis not present

## 2018-08-24 MED ORDER — OXYCODONE-ACETAMINOPHEN 10-325 MG PO TABS: 1.0000 | ORAL_TABLET | Freq: Four times a day (QID) | ORAL | 0 refills | Status: AC | PRN

## 2018-08-24 NOTE — Progress Notes (Signed)
Virtual Visit via Telephone Note  I connected with [name] on 08/24/18 at 10:30 AM EDT by telephone and verified that I am speaking with the correct person using two identifiers.   I discussed the limitations, risks, security and privacy concerns of performing an evaluation and management service by telephone and the availability of in person appointments. I also discussed with the patient that there may be a patient responsible charge related to this service. The patient expressed understanding and agreed to proceed.  Location patient: home Location provider: work office Participants present for the call: patient, provider Patient did not have a visit in the prior 7 days to address this/these issue(s).   History of Present Illness:  This is a scheduled visit mainly for narcotic refills.  He was hospitalized in March of this year with sepsis due to ascending cholangitis with ESBL E. coli, he completed a course of IV antibiotics (Invanz).  GI, Dr. Rush Landmark, performed ERCP that admission with stable placement and was also found to have atypical cells.  He tells me that he has a repeat ERCP scheduled for the end of this month.  He still complains of some shortness of breath, has followed up with [name] for this, he has been found to have small pleural effusions but other than this no etiology.  He takes chronic oxycodone 10/325 mg twice a day for chronic pain.   Observations/Objective: Patient sounds cheerful and well on the phone. I do not appreciate any increased work of breathing. Speech and thought processing are grossly intact. Patient reported vitals: None reported today   Current Outpatient Medications:  .  acetaminophen (TYLENOL) 325 MG tablet, Take 650 mg by mouth every 6 (six) hours as needed for mild pain., Disp: , Rfl:  .  BYSTOLIC 10 MG tablet, Take 10 mg by mouth daily., Disp: , Rfl:  .  cyanocobalamin (,VITAMIN B-12,) 1000 MCG/ML injection, INJECT 1 ML (CC)  INTRAMUSCULARLY FOR 1 DOSE (Patient taking differently: Inject 1,000 mcg into the muscle every 30 (thirty) days. ), Disp: 6 mL, Rfl: 3 .  dicyclomine (BENTYL) 10 MG capsule, Take 1 capsule (10 mg total) by mouth every 8 (eight) hours as needed for spasms., Disp: 3060 capsule, Rfl: 1 .  ezetimibe (ZETIA) 10 MG tablet, Take 1 tablet (10 mg total) by mouth daily., Disp: 90 tablet, Rfl: 1 .  folic acid (FOLVITE) 202 MCG tablet, Take 400 mcg by mouth daily.  , Disp: , Rfl:  .  isosorbide mononitrate (IMDUR) 60 MG 24 hr tablet, Take 1 tablet (60 mg total) by mouth daily., Disp: 90 tablet, Rfl: 1 .  NEEDLE, DISP, 25 G (B-D DISP NEEDLE 25GX1") 25G X 1" MISC, Inject 1000 mcg into muscle once a month., Disp: 50 each, Rfl: 0 .  nitroGLYCERIN (NITROSTAT) 0.4 MG SL tablet, Place 1 tablet (0.4 mg total) under the tongue every 5 (five) minutes as needed for chest pain., Disp: 30 tablet, Rfl: 3 .  pantoprazole (PROTONIX) 40 MG tablet, Take 30- 60 min before your first and last meals of the day, Disp: 60 tablet, Rfl: 3 .  polycarbophil (FIBERCON) 625 MG tablet, Take 625 mg by mouth daily., Disp: , Rfl:  .  Probiotic Product (PROBIOTIC ADVANCED PO), Take 1 tablet by mouth daily., Disp: , Rfl:  .  rOPINIRole (REQUIP) 2 MG tablet, Take 1 tablet (2 mg total) by mouth at bedtime., Disp: 90 tablet, Rfl: 1 .  venlafaxine (EFFEXOR) 75 MG tablet, Take 1 tablet (  75 mg total) by mouth daily., Disp: 90 tablet, Rfl: 1 .  oxyCODONE-acetaminophen (PERCOCET) 10-325 MG tablet, Take 1 tablet by mouth every 6 (six) hours as needed for up to 5 days for pain., Disp: 60 tablet, Rfl: 0 .  oxyCODONE-acetaminophen (PERCOCET) 10-325 MG tablet, Take 1 tablet by mouth every 6 (six) hours as needed for up to 5 days for pain., Disp: 60 tablet, Rfl: 0 .  oxyCODONE-acetaminophen (PERCOCET) 10-325 MG tablet, Take 1 tablet by mouth every 6 (six) hours as needed for up to 5 days for pain., Disp: 60 tablet, Rfl: 0  Review of Systems:  Constitutional:  Denies fever, chills, diaphoresis, appetite change and fatigue.  HEENT: Denies photophobia, eye pain, redness, hearing loss, ear pain, congestion, sore throat, rhinorrhea, sneezing, mouth sores, trouble swallowing, neck pain, neck stiffness and tinnitus.   Respiratory: Denies SOB, DOE, cough, chest tightness,  and wheezing.   Cardiovascular: Denies chest pain, palpitations and leg swelling.  Gastrointestinal: Denies nausea, vomiting, abdominal pain, diarrhea, constipation, blood in stool and abdominal distention.  Genitourinary: Denies dysuria, urgency, frequency, hematuria, flank pain and difficulty urinating.  Endocrine: Denies: hot or cold intolerance, sweats, changes in hair or nails, polyuria, polydipsia. Musculoskeletal: Denies myalgias, back pain, joint swelling, arthralgias and gait problem.  Skin: Denies pallor, rash and wound.  Neurological: Denies dizziness, seizures, syncope, weakness, light-headedness, numbness and headaches.  Hematological: Denies adenopathy. Easy bruising, personal or family bleeding history  Psychiatric/Behavioral: Denies suicidal ideation, mood changes, confusion, nervousness, sleep disturbance and agitation   Assessment and Plan:  Chronic low back pain without sciatica, unspecified back pain laterality  NCCSRS reviewed in EPIC   Indication for chronic opioid:  Chronic low back pain without sciatica Medication and dose:  Oxycodone 10/325 mg every 8 hours as needed # pills per month:  Limited to 60 tablets/month Last UDS date:  None on file Opioid Treatment Agreement signed:  Needs to have vital signs at next in person appointment Opioid Treatment Agreement last reviewed with patient:  Not on file [place] reviewed this encounter (include red flags):   Yes, no red flags, overdose risk score is 170    I discussed the assessment and treatment plan with the patient. The patient was provided an opportunity to ask questions and all were answered. The patient  agreed with the plan and demonstrated an understanding of the instructions.   The patient was advised to call back or seek an in-person evaluation if the symptoms worsen or if the condition fails to improve as anticipated.  I provided 13 minutes of non-face-to-face time during this encounter.   [name], MD [facility]

## 2018-09-07 ENCOUNTER — Encounter: Payer: Self-pay | Admitting: Internal Medicine

## 2018-09-07 ENCOUNTER — Ambulatory Visit: Payer: Medicare Other | Admitting: Internal Medicine

## 2018-09-07 ENCOUNTER — Other Ambulatory Visit: Payer: Self-pay

## 2018-09-07 DIAGNOSIS — R05 Cough: Secondary | ICD-10-CM | POA: Diagnosis not present

## 2018-09-07 DIAGNOSIS — R0600 Dyspnea, unspecified: Secondary | ICD-10-CM

## 2018-09-07 DIAGNOSIS — J90 Pleural effusion, not elsewhere classified: Secondary | ICD-10-CM | POA: Diagnosis not present

## 2018-09-07 DIAGNOSIS — R058 Other specified cough: Secondary | ICD-10-CM

## 2018-09-07 DIAGNOSIS — R0609 Other forms of dyspnea: Secondary | ICD-10-CM | POA: Diagnosis not present

## 2018-09-07 DIAGNOSIS — R059 Cough, unspecified: Secondary | ICD-10-CM

## 2018-09-07 NOTE — Patient Instructions (Signed)
GERD (REFLUX)  is an extremely common cause of respiratory symptoms just like yours , many times with no obvious heartburn at all.    It can be treated with medication, but also with lifestyle changes including elevation of the head of your bed (ideally with 6 -8inch blocks under the headboard of your bed),  Smoking cessation, avoidance of late meals, excessive alcohol, and avoid fatty foods, chocolate, peppermint, colas, red wine, and acidic juices such as orange juice.  NO MINT OR MENTHOL PRODUCTS SO NO COUGH DROPS  USE SUGARLESS CANDY INSTEAD (Jolley ranchers or Stover's or Life Savers) or even ice chips will also do - the key is to swallow to prevent all throat clearing. NO OIL BASED VITAMINS - use powdered substitutes.  Avoid fish oil when coughing.  Continue protonix 40 mg Take 30- 60 min before your first and last meals of the day and share your symptoms of difficulty swallowing with your GI doctor   Please schedule a follow up office visit in 6 weeks, call sooner if needed with all medications /inhalers/ solutions in hand so we can verify exactly what you are taking. This includes all medications from all doctors and over the [place] separate them into two bags:  the ones you take automatically, no matter what, vs the ones you take just when you feel you need them "BAG #2 is UP TO YOU"  - this will really help [place] help you take your medications more effectively.

## 2018-09-07 NOTE — Assessment & Plan Note (Signed)
First noted 06/23/18 Abd ct f/u from pancreatitis -  Perfusion lung scan 07/28/18  neg for pe/ venous dopplers nl - L chest u/s  08/04/2018  No free effusion to tap  Mostly likely permanent pleural scarring and doubt it's significant given how sedentary he has become and how well he performed on spirometry prior to the effusion > f/u prn    I had an extended discussion with the patient reviewing all relevant studies completed to date and  lasting 25 minutes of a 40  minute office visit addressing      re  severe non-specific but potentially very serious refractory respiratory symptoms of uncertain and potentially multiple  Etiologies.   I directly observed portions of ambulatory 02 saturation study   Each maintenance medication was reviewed in detail including most importantly the difference between maintenance and prns and under what circumstances the prns are to be triggered using an action plan format that is not reflected in the computer generated alphabetically organized AVS.    Please see AVS for specific instructions unique to this office visit that I personally wrote and verbalized to the the pt in detail and then reviewed with pt  by my nurse highlighting any changes in therapy/plan of care  recommended at today's visit.

## 2018-09-07 NOTE — Progress Notes (Signed)
[name], male    DOB: 10-31-1940     MRN: [id number]   Brief patient profile:  17 yowm quit smoking 1978 cc " nasal congestion" all his life, took shots in his 27s but never really helped then breathing problem started around late 1990s (@ his  mid 6's) > cards eval > cabg no better even p cardiac rehab and gradually worse to point where [facility] = can't walk a nl pace on a flat grade s sob but does fine slow and flat so referred to pulmonary clinic 07/27/2018 by Dr   Jerilee Hoh      History of Present Illness  07/27/2018  Pulmonary/ 1st office eval/Andray Assefa  Chief Complaint  Patient presents with  . Pulmonary Consult    Referred by Dr. Matthew Saras. Pt c/o SOB for the past year. He states he gets out of breath just bending over. He also gets SOB lying flat and when he lies on his right side.   Dyspnea: MMRC2 = can't walk a nl pace on a flat grade s sob but does fine slow and flat  Cough: better p sinus surgery  Sleep: wakes up every few hours s cough or gasping for breath, dx osa > can't tolerate cpap / wakes up feeling back pain and tired  SABA use: none  250 ft gentle hill to mb but hasn't done in a year and says he think he could in pm's but "not before lunch cause I'm hurting way too bad all over" rec Change pantoprazole to where you take it Take 30- 60 min before your first and last meals of the day  GERD diet  To get the most out of exercise, you need to be continuously aware that you are short of breath, but never out of breath, for 15- 30 minutes daily. As you improve, it will actually be easier for you to do the same amount of exercise  in  30 minutes so always push to the level where you are short of breath.      09/07/2018  f/u ov/[facility] re: L effusion p pancreatitis/ doe and uacs, did not bring meds/not following gerd diet with active dysphagia Chief Complaint  Patient presents with  . Follow-up    Patient reports that he still has sob with exertion. He reports  that since his last visit he has had a couple really bad days.   Dyspnea:  mb still makes sob and has to stop twice both ways  Cough: clear throat freq/ ?  better on protonix min mucus - same problem as long as he can remember  "head always stopped up but better p sinus surger around 2000 / using lots of mints / min actual mucus  Sleeping: cough worse at hs despite bed blocks and 4 in  SABA use: no  02: no   No obvious day to day or daytime variability or assoc excess/ purulent sputum or mucus plugs or hemoptysis or cp or chest tightness, subjective wheeze or overt sinus or hb symptoms.    Also denies any obvious fluctuation of symptoms with weather or environmental changes or other aggravating or alleviating factors except as outlined above   No unusual exposure hx or h/o childhood pna/ asthma or knowledge of premature birth.  Current Allergies, Complete Past Medical History, Past Surgical History, Family History, and Social History were reviewed in [employer] record.  ROS  The following are not active complaints unless bolded Hoarseness, sore throat,  dysphagia, dental problems, itching, sneezing,  nasal congestion and sesnation of discharge of excess mucus or purulent secretions, ear ache,   fever, chills, sweats, unintended wt loss or wt gain, classically pleuritic or exertional cp,  orthopnea pnd or arm/hand swelling  or leg swelling, presyncope, palpitations, abdominal pain, anorexia, nausea, vomiting, diarrhea  or change in bowel habits or change in bladder habits, change in stools or change in urine, dysuria, hematuria,  rash, arthralgias, visual complaints, headache, numbness, weakness or ataxia or problems with walking or coordination,  change in mood or  memory.        Current Meds  Medication Sig  . acetaminophen (TYLENOL) 325 MG tablet Take 650 mg by mouth every 6 (six) hours as needed for mild pain.  [name] aspirin EC 81 MG tablet Take 81 mg by mouth at bedtime.   [name] BYSTOLIC 10 MG tablet Take 10 mg by mouth daily.  . cyanocobalamin (,VITAMIN B-12,) 1000 MCG/ML injection INJECT 1 ML (CC) INTRAMUSCULARLY FOR 1 DOSE (Patient taking differently: Inject 1,000 mcg into the muscle every 30 (thirty) days. )  . dicyclomine (BENTYL) 10 MG capsule Take 1 capsule (10 mg total) by mouth every 8 (eight) hours as needed for spasms.  [name] ezetimibe (ZETIA) 10 MG tablet Take 1 tablet (10 mg total) by mouth daily.  . folic acid (FOLVITE) 638 MCG tablet Take 400 mcg by mouth daily.    . hydroxypropyl methylcellulose / hypromellose (ISOPTO TEARS / GONIOVISC) 2.5 % ophthalmic solution Place 1 drop into both eyes 3 (three) times daily as needed for dry eyes.  . isosorbide mononitrate (IMDUR) 60 MG 24 hr tablet Take 1 tablet (60 mg total) by mouth daily.  . Menthol, Topical Analgesic, (BENGAY EX) Apply 1 application topically daily as needed (pain).  [name] NEEDLE, DISP, 25 G (B-D DISP NEEDLE 25GX1") 25G X 1" MISC Inject 1000 mcg into muscle once a month.  . nitroGLYCERIN (NITROSTAT) 0.4 MG SL tablet Place 1 tablet (0.4 mg total) under the tongue every 5 (five) minutes as needed for chest pain.  [name] oxyCODONE-acetaminophen (PERCOCET) 10-325 MG tablet Take 1 tablet by mouth 2 (two) times daily.  . pantoprazole (PROTONIX) 40 MG tablet Take 30- 60 min before your first and last meals of the day (Patient taking differently: Take 40 mg by mouth daily. )  . Probiotic Product (PROBIOTIC ADVANCED PO) Take 1 tablet by mouth 2 (two) times daily.   [name] rOPINIRole (REQUIP) 2 MG tablet Take 1 tablet (2 mg total) by mouth at bedtime.  . rosuvastatin (CRESTOR) 20 MG tablet Take 20 mg by mouth daily.  [name] venlafaxine (EFFEXOR) 75 MG tablet Take 1 tablet (75 mg total) by mouth daily.                  Objective:    Amb somber wm nad has trouble answering questions related to symptoms with anything specific eg  "hurt all over" "always had congestion"   Wt Readings from Last 3 Encounters:  09/07/18  179 lb 9.6 oz (81.5 kg)  07/27/18 176 lb (79.8 kg)  05/19/18 180 lb (81.6 kg)     Vital signs reviewed - Note on arrival 02 sats  96% on RA         HEENT:  No upper teech, nl oropharynx but mucosa extremely dry. Nl external ear canals without cough reflex -  Mild bilateral non-specific turbinate edema     NECK :  without JVD/Nodes/TM/ nl carotid upstrokes bilaterally   LUNGS:  no acc muscle use,  Min barrel  contour chest wall with bilateral  slightly decreased bs and dullness at L base to percussion   without cough on insp or exp maneuvers  / no rattling at all on FVC or voluntary cough    CV:  RRR  no s3 or murmur or increase in P2, and no edema   ABD:  soft and nontender with pos end  insp Hoover's  in the supine position. No bruits or organomegaly appreciated, bowel sounds nl  MS:   Nl gait/  ext warm without deformities, calf tenderness, cyanosis or clubbing No obvious joint restrictions   SKIN: warm and dry without lesions    NEURO:  alert, approp, nl sensorium with  no motor or cerebellar deficits apparent.              Assessment

## 2018-09-07 NOTE — Assessment & Plan Note (Addendum)
Onset "as far back as he can remember with sense of nasal congestion and pnds, better p sinus surgery 2006 [place]  - Allergy profile  08/08/2018 >  Eos 0.3 /  IgE  258 RAST Pos dog > cat  - Sinus CT 09/07/2018 >>>   >>>> Needs to avoid mints, use hard rock candy to promote swallowing salivary secretions and stop the throat clearing if possible if not consider trial of gabapentin for irritable larynx syndrome (onset was probably early in life but much worse since pancreatitis flare)

## 2018-09-07 NOTE — Assessment & Plan Note (Addendum)
Onset late 1990's  - Spirometry 10/22/09   FEV1 2.96 (86%)  With  fev1/fvc ratio 0.85 no graphics avail  - 07/27/2018   Walked RA  2 laps @  approx 214ft each @ avg pace  stopped due to  End of study, sob but not out of breath with sats still 94% -  D dimer 09/27/18 = 4.01 >>> perfusion lung scan 07/28/2018 > no evidence of pe - venous dopplers 07/29/18  done due to pos d dimer > neg bilaterally  - 09/07/2018   Walked RA  2 laps @  approx 250 ft each @ slow then fast pace  stopped due to  End of study, no sob and sats 94% at end of study  The pleural effusion certainly isn't helping but he's describing doe at home at a much lower level of intensity than could be attributed to the effusion and is not reproducible here > rec reconditioning and f/u in 6 weeks with all meds in hand using a trust but verify approach to confirm accurate Medication  Reconciliation The principal here is that until we are certain that the  patients are doing what we've asked, it makes no sense to ask them to do more.

## 2018-09-08 ENCOUNTER — Other Ambulatory Visit (HOSPITAL_COMMUNITY)
Admission: RE | Admit: 2018-09-08 | Discharge: 2018-09-08 | Disposition: A | Discharge status: Home / Self Care | Payer: Medicare Other | Source: Ambulatory Visit | Attending: Gastroenterology | Admitting: Gastroenterology

## 2018-09-08 DIAGNOSIS — Z20828 Contact with and (suspected) exposure to other viral communicable diseases: Secondary | ICD-10-CM | POA: Diagnosis not present

## 2018-09-08 DIAGNOSIS — Z01812 Encounter for preprocedural laboratory examination: Secondary | ICD-10-CM | POA: Insufficient documentation

## 2018-09-08 LAB — SARS CORONAVIRUS 2 (TAT 6-24 HRS): SARS Coronavirus 2: NEGATIVE

## 2018-09-09 ENCOUNTER — Other Ambulatory Visit: Payer: Self-pay

## 2018-09-09 ENCOUNTER — Encounter (HOSPITAL_COMMUNITY): Payer: Self-pay | Admitting: *Deleted

## 2018-09-09 NOTE — Progress Notes (Signed)
Pt denies any acute pulmonary issues. Pt denies chest pain.  Pt stated that he is under the care of Dr. Claiborne Billings, Cardiology and Dr. [name], PCP.  Pt denies recent labs. Pt made aware to stop taking  vitamins, fish oil and herbal medications. Do not take any NSAIDs ie: Ibuprofen, Advil, Naproxen (Aleve), Motrin, BC and Goody Powder. Pt verbalized understanding of all pre-op instructions.  Anesthesiologist, please review pt pulmonary and cardiac history on DOS.

## 2018-09-09 NOTE — Progress Notes (Signed)
Attempted to call pt for pre call procedure. Called multiple times with no response and automated VM

## 2018-09-12 ENCOUNTER — Ambulatory Visit (HOSPITAL_COMMUNITY): Payer: Medicare Other | Admitting: Certified Registered"

## 2018-09-12 ENCOUNTER — Ambulatory Visit (HOSPITAL_COMMUNITY): Payer: Medicare Other

## 2018-09-12 ENCOUNTER — Ambulatory Visit (HOSPITAL_COMMUNITY)
Admission: RE | Admit: 2018-09-12 | Discharge: 2018-09-12 | Disposition: A | Discharge status: Home / Self Care | Payer: Medicare Other | Attending: Gastroenterology | Admitting: Gastroenterology

## 2018-09-12 ENCOUNTER — Other Ambulatory Visit: Payer: Self-pay

## 2018-09-12 ENCOUNTER — Encounter (HOSPITAL_COMMUNITY)
Admission: RE | Disposition: A | Discharge status: Home / Self Care | Payer: Self-pay | Source: Home / Self Care | Attending: Gastroenterology

## 2018-09-12 ENCOUNTER — Encounter (HOSPITAL_COMMUNITY): Payer: Self-pay

## 2018-09-12 DIAGNOSIS — Z87891 Personal history of nicotine dependence: Secondary | ICD-10-CM | POA: Insufficient documentation

## 2018-09-12 DIAGNOSIS — G2581 Restless legs syndrome: Secondary | ICD-10-CM | POA: Diagnosis not present

## 2018-09-12 DIAGNOSIS — I2510 Atherosclerotic heart disease of native coronary artery without angina pectoris: Secondary | ICD-10-CM | POA: Diagnosis not present

## 2018-09-12 DIAGNOSIS — K317 Polyp of stomach and duodenum: Secondary | ICD-10-CM | POA: Diagnosis not present

## 2018-09-12 DIAGNOSIS — J449 Chronic obstructive pulmonary disease, unspecified: Secondary | ICD-10-CM | POA: Insufficient documentation

## 2018-09-12 DIAGNOSIS — Z951 Presence of aortocoronary bypass graft: Secondary | ICD-10-CM | POA: Insufficient documentation

## 2018-09-12 DIAGNOSIS — K831 Obstruction of bile duct: Secondary | ICD-10-CM | POA: Diagnosis not present

## 2018-09-12 DIAGNOSIS — K838 Other specified diseases of biliary tract: Secondary | ICD-10-CM | POA: Diagnosis not present

## 2018-09-12 DIAGNOSIS — Z86010 Personal history of colonic polyps: Secondary | ICD-10-CM | POA: Diagnosis not present

## 2018-09-12 DIAGNOSIS — K859 Acute pancreatitis without necrosis or infection, unspecified: Secondary | ICD-10-CM | POA: Diagnosis not present

## 2018-09-12 DIAGNOSIS — K8051 Calculus of bile duct without cholangitis or cholecystitis with obstruction: Secondary | ICD-10-CM | POA: Diagnosis not present

## 2018-09-12 DIAGNOSIS — G4730 Sleep apnea, unspecified: Secondary | ICD-10-CM | POA: Insufficient documentation

## 2018-09-12 DIAGNOSIS — K219 Gastro-esophageal reflux disease without esophagitis: Secondary | ICD-10-CM | POA: Insufficient documentation

## 2018-09-12 DIAGNOSIS — I10 Essential (primary) hypertension: Secondary | ICD-10-CM | POA: Diagnosis not present

## 2018-09-12 DIAGNOSIS — K2950 Unspecified chronic gastritis without bleeding: Secondary | ICD-10-CM | POA: Insufficient documentation

## 2018-09-12 DIAGNOSIS — F329 Major depressive disorder, single episode, unspecified: Secondary | ICD-10-CM | POA: Insufficient documentation

## 2018-09-12 DIAGNOSIS — E785 Hyperlipidemia, unspecified: Secondary | ICD-10-CM | POA: Insufficient documentation

## 2018-09-12 DIAGNOSIS — Z4659 Encounter for fitting and adjustment of other gastrointestinal appliance and device: Secondary | ICD-10-CM | POA: Diagnosis not present

## 2018-09-12 DIAGNOSIS — K2970 Gastritis, unspecified, without bleeding: Secondary | ICD-10-CM | POA: Diagnosis not present

## 2018-09-12 DIAGNOSIS — Z8551 Personal history of malignant neoplasm of bladder: Secondary | ICD-10-CM | POA: Insufficient documentation

## 2018-09-12 DIAGNOSIS — K2270 Barrett's esophagus without dysplasia: Secondary | ICD-10-CM | POA: Diagnosis not present

## 2018-09-12 DIAGNOSIS — R8589 Other abnormal findings in specimens from digestive organs and abdominal cavity: Secondary | ICD-10-CM | POA: Diagnosis not present

## 2018-09-12 DIAGNOSIS — Z9689 Presence of other specified functional implants: Secondary | ICD-10-CM | POA: Diagnosis not present

## 2018-09-12 DIAGNOSIS — Z8719 Personal history of other diseases of the digestive system: Secondary | ICD-10-CM | POA: Diagnosis not present

## 2018-09-12 DIAGNOSIS — K228 Other specified diseases of esophagus: Secondary | ICD-10-CM

## 2018-09-12 HISTORY — PX: ESOPHAGOGASTRODUODENOSCOPY (EGD) WITH PROPOFOL: SHX5813

## 2018-09-12 HISTORY — PX: ENDOSCOPIC MUCOSAL RESECTION: SHX6839

## 2018-09-12 HISTORY — PX: STENT REMOVAL: SHX6421

## 2018-09-12 HISTORY — PX: REMOVAL OF STONES: SHX5545

## 2018-09-12 HISTORY — PX: SAVORY DILATION: SHX5439

## 2018-09-12 HISTORY — PX: FINE NEEDLE ASPIRATION: SHX5430

## 2018-09-12 HISTORY — PX: HEMOSTASIS CLIP PLACEMENT: SHX6857

## 2018-09-12 HISTORY — PX: EUS: SHX5427

## 2018-09-12 HISTORY — PX: BILIARY DILATION: SHX6850

## 2018-09-12 HISTORY — PX: BILIARY BRUSHING: SHX6843

## 2018-09-12 HISTORY — PX: SUBMUCOSAL LIFTING INJECTION: SHX6855

## 2018-09-12 HISTORY — PX: ENDOSCOPIC RETROGRADE CHOLANGIOPANCREATOGRAPHY (ERCP) WITH PROPOFOL: SHX5810

## 2018-09-12 HISTORY — PX: BILIARY STENT PLACEMENT: SHX5538

## 2018-09-12 HISTORY — PX: BIOPSY: SHX5522

## 2018-09-12 SURGERY — ESOPHAGOGASTRODUODENOSCOPY (EGD) WITH PROPOFOL
Anesthesia: General

## 2018-09-12 MED ORDER — SUCCINYLCHOLINE CHLORIDE 200 MG/10ML IV SOSY
PREFILLED_SYRINGE | INTRAVENOUS | Status: DC | PRN
  Administered 2018-09-12: 120 mg via INTRAVENOUS

## 2018-09-12 MED ORDER — DEXAMETHASONE SODIUM PHOSPHATE 10 MG/ML IJ SOLN
INTRAMUSCULAR | Status: DC | PRN
  Administered 2018-09-12: 8 mg via INTRAVENOUS

## 2018-09-12 MED ORDER — INDOMETHACIN 50 MG RE SUPP
RECTAL | Status: DC | PRN
  Administered 2018-09-12: 100 mg via RECTAL

## 2018-09-12 MED ORDER — CIPROFLOXACIN IN D5W 400 MG/200ML IV SOLN
INTRAVENOUS | Status: DC | PRN
  Administered 2018-09-12: 400 mg via INTRAVENOUS

## 2018-09-12 MED ORDER — LIDOCAINE 2% (20 MG/ML) 5 ML SYRINGE
INTRAMUSCULAR | Status: DC | PRN
  Administered 2018-09-12: 100 mg via INTRAVENOUS

## 2018-09-12 MED ORDER — PROPOFOL 10 MG/ML IV BOLUS
INTRAVENOUS | Status: DC | PRN
  Administered 2018-09-12: 150 mg via INTRAVENOUS

## 2018-09-12 MED ORDER — FENTANYL CITRATE (PF) 100 MCG/2ML IJ SOLN
INTRAMUSCULAR | Status: DC | PRN
  Administered 2018-09-12 (×2): 50 ug via INTRAVENOUS

## 2018-09-12 MED ORDER — ONDANSETRON HCL 4 MG/2ML IJ SOLN
INTRAMUSCULAR | Status: DC | PRN
  Administered 2018-09-12: 4 mg via INTRAVENOUS

## 2018-09-12 MED ORDER — SODIUM CHLORIDE 0.9 % IV SOLN
INTRAVENOUS | Status: DC | PRN
  Administered 2018-09-12: 40 mL

## 2018-09-12 MED ORDER — ROCURONIUM BROMIDE 50 MG/5ML IV SOSY
PREFILLED_SYRINGE | INTRAVENOUS | Status: DC | PRN
  Administered 2018-09-12 (×2): 10 mg via INTRAVENOUS
  Administered 2018-09-12: 50 mg via INTRAVENOUS

## 2018-09-12 MED ORDER — GLUCAGON HCL RDNA (DIAGNOSTIC) 1 MG IJ SOLR
INTRAMUSCULAR | Status: DC | PRN
  Administered 2018-09-12 (×2): 0.25 mg via INTRAVENOUS

## 2018-09-12 MED ORDER — GLYCOPYRROLATE PF 0.2 MG/ML IJ SOSY
PREFILLED_SYRINGE | INTRAMUSCULAR | Status: DC | PRN
  Administered 2018-09-12: .1 mg via INTRAVENOUS

## 2018-09-12 MED ORDER — GLUCAGON HCL RDNA (DIAGNOSTIC) 1 MG IJ SOLR
INTRAMUSCULAR | Status: AC
  Filled 2018-09-12: qty 1

## 2018-09-12 MED ORDER — INDOMETHACIN 50 MG RE SUPP
RECTAL | Status: AC
  Filled 2018-09-12: qty 2

## 2018-09-12 MED ORDER — PHENYLEPHRINE 40 MCG/ML (10ML) SYRINGE FOR IV PUSH (FOR BLOOD PRESSURE SUPPORT)
PREFILLED_SYRINGE | INTRAVENOUS | Status: DC | PRN
  Administered 2018-09-12: 80 ug via INTRAVENOUS

## 2018-09-12 MED ORDER — SUGAMMADEX SODIUM 200 MG/2ML IV SOLN
INTRAVENOUS | Status: DC | PRN
  Administered 2018-09-12: 160 mg via INTRAVENOUS

## 2018-09-12 MED ORDER — SODIUM CHLORIDE 0.9 % IV SOLN
INTRAVENOUS | Status: DC | PRN
  Administered 2018-09-12: 40 ug/min via INTRAVENOUS

## 2018-09-12 MED ORDER — SODIUM CHLORIDE 0.9 % IV SOLN
INTRAVENOUS | Status: DC
  Administered 2018-09-12 (×3): via INTRAVENOUS

## 2018-09-12 MED ORDER — CIPROFLOXACIN IN D5W 400 MG/200ML IV SOLN
INTRAVENOUS | Status: AC
  Filled 2018-09-12: qty 200

## 2018-09-12 SURGICAL SUPPLY — 15 items

## 2018-09-12 NOTE — Op Note (Signed)
[facility] Patient Name: [name] Procedure Date : 09/12/2018 MRN: [id number] Attending MD: Justice Britain , MD Date of Birth: 1940-08-10 CSN: [id number] Age: 78 Admit Type: Outpatient Procedure:                Upper EUS Indications:              Common bile duct dilation (acquired) seen on CT                            scan, Obstruction of bile duct on CT, Acute                            pancreatitis Providers:                Justice Britain, MD, Vista Lawman, RN, Cletis Athens, Technician Referring MD:              Medicines:                General Anesthesia Complications:            No immediate complications. Estimated Blood Loss:     Estimated blood loss was minimal. Procedure:                Pre-Anesthesia Assessment:                           - Prior to the procedure, a History and Physical                            was performed, and patient medications and                            allergies were reviewed. The patient's tolerance of                            previous anesthesia was also reviewed. The risks                            and benefits of the procedure and the sedation                            options and risks were discussed with the patient.                            All questions were answered, and informed consent                            was obtained. [Anticoagulant Agents] [id number] Prior to                            [place]. [ASA Grade]. After reviewing the risks                            and  benefits, the patient was deemed in                            satisfactory condition to undergo the procedure.                           After obtaining informed consent, the endoscope was                            passed under direct vision. Throughout the                            procedure, the patient's blood pressure, pulse, and                            oxygen saturations were monitored  continuously. The                            [id number]) Olympus Linear EUS scope was                            introduced through the mouth, and advanced to the                            duodenum for ultrasound examination from the                            stomach and duodenum. The upper EUS was                            accomplished without difficulty. Scope In: Scope Out: Findings:      ENDOSCOPIC FINDING: :      No gross lesions were noted in the proximal esophagus and in the mid       esophagus.      Scattered islands of salmon-colored mucosa were present from 41 to 43       cm. No other visible abnormalities were present.      There is no endoscopic evidence of stenosis in the entire esophagus.      Segmental mild inflammation characterized by erythema was found in the       gastric body and in the gastric antrum. Biopsies were taken with a cold       forceps for histology and Helicobacter pylori testing.      A single 10 mm semi-sessile polyp with no bleeding and stigmata of       recent bleeding was found in the prepyloric region of the stomach.       Preparations were made for mucosal resection. Orise gel was injected to       raise the lesion. Snare mucosal resection was performed. Resection and       retrieval were complete. To close a defect after mucosal resection, two       hemostatic clips were successfully placed (MR conditional). There was no       bleeding at the end of the procedure.      No gross lesions were noted in the duodenal bulb, in the first portion  of the duodenum and in the second portion of the duodenum. Biopsies for       histology were taken with a cold forceps for evaluation of celiac       disease.      A previously placed plastic biliary stent was seen at the major papilla.       Removal of a stent was accomplished with a snare. This was biopsied with       a sent for cytology.      There was evidence of a patent sphincterotomy in  the major papilla.      A guidewire was placed and the scope was withdrawn. Dilation was       performed in the entire esophagus with a Savary dilator with no       resistance at 16 mm and mild resistance at 17 mm and 18 mm. The dilation       site was examined following endoscope reinsertion and showed mild       mucosal disruption and no perforation.      ENDOSONOGRAPHIC FINDING: :      There was a suggestion of a stricture in the lower third of the main       bile duct distally at the point of where biliary dilation was noted.       Fine needle biopsy was performed of this area. It is not a clear       pancreatic mass but overall measured 9.0 mm by 12.4 mm. Color Doppler       imaging was utilized prior to needle puncture to confirm a lack of       significant vascular structures within the needle path. Five passes were       made with the 22 gauge ultrasound biopsy needle using a transduodenal       approach. Visible cores of tissue were obtained. Preliminary cytologic       examination and touch preps were performed. Final cytology results are       pending.      Multiple stones were visualized endosonographically in the common bile       duct. The largest stone measured up to 10 mm in greatest dimension. The       stones were irregular. They were hyperechoic and characterized by       shadowing.      There was dilation in the common bile duct and in the common hepatic       duct which measured up to 17 mm.      The pancreatic duct had a normal endosonographic appearance in the       pancreatic head (PD = 2.5 mm), genu of the pancreas (PD = 0.7 mm), body       of the pancreas (PD = 0.7 mm -> 0.8 mm) and tail of the pancreas (PD =       0.6 mm).      Pancreatic parenchymal abnormalities were noted in the entire pancreas.       These consisted of diffuse echogenicity and lobularity without       honeycombing.      Endosonographic imaging of the ampulla showed no intramural        (subepithelial) lesion.      Endosonographic imaging in the visualized portion of the liver showed no       mass.      No malignant-appearing lymph nodes were visualized in the celiac region       (  level 20), peripancreatic region and porta hepatis region.      The celiac region was visualized. Impression:               EGD Impression:                           - No gross lesions in proximal/middle esophagus.                            Salmon-colored mucosa suspicious for short-segment                            Barrett's esophagus. Biopsied. Dilation of                            esophagus performed.                           - Gastritis. Biopsied.                           - A single gastric polyp. Resected and retrieved                            via mucosal resection. Clips (MR conditional) were                            placed to close the defect.                           - No gross lesions in the duodenal bulb, in the                            first portion of the duodenum and in the second                            portion of the duodenum. Biopsied for                            enteropathy/celiac rule out.                           - Plastic biliary stent in the duodenum. Removed.                           - Patent sphincterotomy was found.                           EUS Impression:                           - There was a suggestion of a stricture in the                            lower third of the main bile duct. Fine needle  biopsy performed of the region (although no overt                            mass was noted significant thickening present).                           - Multiple stones were visualized                            endosonographically in the common bile duct.                           - There was dilation in the common bile duct and in                            the common hepatic duct which measured up to 17 mm.                            - The pancreatic duct had a normal endosonographic                            appearance in the pancreatic head, genu of the                            pancreas, body of the pancreas and tail of the                            pancreas.                           - Pancreatic parenchymal abnormalities consisting                            of diffuse echogenicity and lobularity were noted                            in the entire pancreas.                           - No malignant-appearing lymph nodes were                            visualized in the celiac region (level 20),                            peripancreatic region and porta hepatis region. Recommendation:           - Proceed to scheduled ERCP.                           - Await cytology results and await path results.                           - Based on findings of pathology will dictate  follow up EGD needs.                           - Based on patient's response to empiric dilation                            will consider further workup of patient's dysphagia                            with manometry.                           - The findings and recommendations were discussed                            with the patient.                           - The findings and recommendations were discussed                            with the patient's family. Procedure Code(s):        --- Professional ---                           [phone], 65, Esophagogastroduodenoscopy, flexible,                            transoral; with endoscopic mucosal resection                           43238, Esophagogastroduodenoscopy, flexible,                            transoral; with transendoscopic ultrasound-guided                            intramural or transmural fine needle                            aspiration/biopsy(s), (includes endoscopic                            ultrasound examination limited to the esophagus,                             stomach or duodenum, and adjacent structures)                           43247, Esophagogastroduodenoscopy, flexible,                            transoral; with removal of foreign body(s)                           43248, Esophagogastroduodenoscopy, flexible,  transoral; with insertion of guide wire followed by                            passage of dilator(s) through esophagus over guide                            wire Diagnosis Code(s):        --- Professional ---                           K22.8, Other specified diseases of esophagus                           K80.51, Calculus of bile duct without cholangitis                            or cholecystitis with obstruction                           K29.70, Gastritis, unspecified, without bleeding                           K31.7, Polyp of stomach and duodenum                           Z46.59, Encounter for fitting and adjustment of                            other gastrointestinal appliance and device                           Z98.890, Other specified postprocedural states                           K83.8, Other specified diseases of biliary tract                           K86.9, Disease of pancreas, unspecified                           I89.9, Noninfective disorder of lymphatic vessels                            and lymph nodes, unspecified                           K85.90, Acute pancreatitis without necrosis or                            infection, unspecified                           R93.2, Abnormal findings on diagnostic imaging of                            liver and biliary tract CPT copyright 2019 [employer]. All rights reserved. The codes  documented in this report are preliminary and upon coder review may  be revised to meet current compliance requirements. Justice Britain, MD 09/12/2018 3:38:22 PM Number of Addenda: 0

## 2018-09-12 NOTE — Discharge Instructions (Signed)
YOU HAD AN ENDOSCOPIC PROCEDURE TODAY: Refer to the procedure report and other information in the discharge instructions given to you for any specific questions about what was found during the examination. If this information does not answer your questions, please call [facility] office at [phone] to clarify.   YOU SHOULD EXPECT: Some feelings of bloating in the abdomen. Passage of more gas than usual. Walking can help get rid of the air that was put into your GI tract during the procedure and reduce the bloating. If you had a lower endoscopy (such as a colonoscopy or flexible sigmoidoscopy) you may notice spotting of blood in your stool or on the toilet paper. Some abdominal soreness may be present for a day or two, also.  DIET: Your first meal following the procedure should be a light meal and then it is ok to progress to your normal diet. A half-sandwich or bowl of soup is an example of a good first meal. Heavy or fried foods are harder to digest and may make you feel nauseous or bloated. Drink plenty of fluids but you should avoid alcoholic beverages for 24 hours. If you had a esophageal dilation, please see attached instructions for diet.    ACTIVITY: Your care partner should take you home directly after the procedure. You should plan to take it easy, moving slowly for the rest of the day. You can resume normal activity the day after the procedure however YOU SHOULD NOT DRIVE, use power tools, machinery or perform tasks that involve climbing or major physical exertion for 24 hours (because of the sedation medicines used during the test).   SYMPTOMS TO REPORT IMMEDIATELY: A gastroenterologist can be reached at any hour. Please call [phone]  for any of the following symptoms:   Following upper endoscopy (EGD, EUS, ERCP, esophageal dilation) Vomiting of blood or coffee ground material  New, significant abdominal pain  New, significant chest pain or pain under the shoulder blades  Painful or  persistently difficult swallowing  New shortness of breath  Black, tarry-looking or red, bloody stools  FOLLOW UP:  If any biopsies were taken you will be contacted by phone or by letter within the next 1-3 weeks. Call [phone]  if you have not heard about the biopsies in 3 weeks.  Please also call with any specific questions about appointments or follow up tests.  

## 2018-09-12 NOTE — Anesthesia Preprocedure Evaluation (Signed)
Anesthesia Evaluation  Patient identified by MRN, date of birth, ID band Patient awake    Reviewed: Allergy & Precautions, NPO status , Patient's Chart, lab work & pertinent test results  Airway Mallampati: I  TM Distance: >3 FB Neck ROM: Full    Dental  (+) Dental Advisory Given   Pulmonary COPD, former smoker,    Pulmonary exam normal breath sounds clear to auscultation       Cardiovascular hypertension, Pt. on home beta blockers and Pt. on medications + CAD and + CABG  Normal cardiovascular exam Rhythm:Regular Rate:Normal     Neuro/Psych Depression    GI/Hepatic GERD  Medicated,  Endo/Other    Renal/GU Renal InsufficiencyRenal disease     Musculoskeletal   Abdominal Normal abdominal exam  (+)   Peds  Hematology  (+) Blood dyscrasia, anemia ,   Anesthesia Other Findings   Reproductive/Obstetrics                             Anesthesia Physical  Anesthesia Plan  ASA: III  Anesthesia Plan: General   Post-op Pain Management:    Induction: Intravenous  PONV Risk Score and Plan: 2 and Dexamethasone, Ondansetron and Treatment may vary due to age or medical condition  Airway Management Planned: Oral ETT  Additional Equipment:   Intra-op Plan:   Post-operative Plan: Extubation in OR  Informed Consent: I have reviewed the patients History and Physical, chart, labs and discussed the procedure including the risks, benefits and alternatives for the proposed anesthesia with the patient or authorized representative who has indicated his/her understanding and acceptance.       Plan Discussed with: CRNA  Anesthesia Plan Comments:         Anesthesia Quick Evaluation

## 2018-09-12 NOTE — Anesthesia Procedure Notes (Signed)
Procedure Name: Intubation Date/Time: 09/12/2018 12:38 PM Performed by: Orlie Dakin, CRNA Pre-anesthesia Checklist: Patient identified, Emergency Drugs available, Suction available and Patient being monitored Patient Re-evaluated:Patient Re-evaluated prior to induction Oxygen Delivery Method: Circle system utilized Preoxygenation: Pre-oxygenation with 100% oxygen Induction Type: IV induction Ventilation: Mask ventilation without difficulty Laryngoscope Size: Miller and 3 Grade View: Grade I Tube type: Oral Tube size: 7.5 mm Number of attempts: 1 Airway Equipment and Method: Stylet Placement Confirmation: ETT inserted through vocal cords under direct vision,  positive ETCO2 and breath sounds checked- equal and bilateral Secured at: 23 cm Tube secured with: Tape Dental Injury: Teeth and Oropharynx as per pre-operative assessment

## 2018-09-12 NOTE — Transfer of Care (Signed)
Immediate Anesthesia Transfer of Care Note  Patient: [name]  Procedure(s) Performed: ESOPHAGOGASTRODUODENOSCOPY (EGD) WITH PROPOFOL (N/A ) UPPER ENDOSCOPIC ULTRASOUND (EUS) RADIAL (N/A ) ENDOSCOPIC RETROGRADE CHOLANGIOPANCREATOGRAPHY (ERCP) WITH PROPOFOL (N/A ) STENT REMOVAL BIOPSY FINE NEEDLE ASPIRATION (FNA) LINEAR BILIARY DILATION BILIARY STENT PLACEMENT BILIARY BRUSHING REMOVAL OF STONES ENDOSCOPIC MUCOSAL RESECTION SUBMUCOSAL LIFTING INJECTION HEMOSTASIS CLIP PLACEMENT SAVORY DILATION (N/A )  Patient Location: Endoscopy Unit  Anesthesia Type:General  Level of Consciousness: drowsy and patient cooperative  Airway & Oxygen Therapy: Patient Spontanous Breathing and Patient connected to face mask oxygen  Post-op Assessment: Report given to RN, Post -op Vital signs reviewed and stable and Patient moving all extremities X 4  Post vital signs: Reviewed and stable  Last Vitals:  Vitals Value Taken Time  BP 143/51 09/12/18 1503  Temp    Pulse 60 09/12/18 1505  Resp 20 09/12/18 1505  SpO2 99 % 09/12/18 1505  Vitals shown include unvalidated device data.  Last Pain:  Vitals:   09/12/18 1123  TempSrc: Temporal  PainSc: 0-No pain         Complications: No apparent anesthesia complications

## 2018-09-12 NOTE — H&P (Signed)
GASTROENTEROLOGY PROCEDURE H&P NOTE   Primary Care Physician: Isaac Bliss, Rayford Halsted, MD  HPI: [name] is a 78 y.o. male who presents for EUS + ERCP.  Past Medical History:  Diagnosis Date  . Allergy   . B12 deficiency anemia   . Blood transfusion without reported diagnosis   . CAD (coronary artery disease)   . Cancer ([place])    bladder  . Colon polyps   . COPD (chronic obstructive pulmonary disease) ([place])   . Depression   . Esophagus, Barrett's   . GERD (gastroesophageal reflux disease)   . History of bladder cancer    Bladder cancer "8 times"  . Hyperlipidemia   . Hypertension   . Localized osteoarthrosis, lower leg   . Restless leg syndrome   . Sleep apnea    does not wear cpap  . Stenosis of esophagus    Past Surgical History:  Procedure Laterality Date  . BILIARY BRUSHING  04/01/2018   Procedure: BILIARY BRUSHING;  Surgeon: Rush Landmark Telford Nab., MD;  Location: [place];  Service: Gastroenterology;;  . BILIARY STENT PLACEMENT  04/01/2018   Procedure: BILIARY STENT PLACEMENT;  Surgeon: Irving Copas., MD;  Location: [place];  Service: Gastroenterology;;  . BIOPSY  04/01/2018   Procedure: BIOPSY;  Surgeon: Irving Copas., MD;  Location: [place];  Service: Gastroenterology;;  . bladder cancer    . CERVICAL DISCECTOMY     ACDF  . COLONOSCOPY  11/17/2005   normal   . CORONARY ARTERY BYPASS GRAFT     x4  . ENDOSCOPIC RETROGRADE CHOLANGIOPANCREATOGRAPHY (ERCP) WITH PROPOFOL N/A 04/01/2018   Procedure: ENDOSCOPIC RETROGRADE CHOLANGIOPANCREATOGRAPHY (ERCP) WITH PROPOFOL;  Surgeon: Rush Landmark Telford Nab., MD;  Location: [place];  Service: Gastroenterology;  Laterality: N/A;  . ESOPHAGOGASTRODUODENOSCOPY  04/29/2010  . ESOPHAGOGASTRODUODENOSCOPY (EGD) WITH PROPOFOL N/A 04/01/2018   Procedure: ESOPHAGOGASTRODUODENOSCOPY (EGD) WITH PROPOFOL;  Surgeon: Rush Landmark Telford Nab., MD;  Location: [place];  Service:  Gastroenterology;  Laterality: N/A;  . EUS  04/01/2018   Procedure: FULL UPPER ENDOSCOPIC ULTRASOUND (EUS) RADIAL;  Surgeon: Irving Copas., MD;  Location: [place];  Service: Gastroenterology;;  . I&D EXTREMITY Left 11/24/2016   Procedure: IRRIGATION AND DEBRIDEMENT LEFT HAND, THUMB, INDEX, MIDDLE, RING, AND SMALL FINGERS WITH RECONSTRUCTION;  Surgeon: Roseanne Kaufman, MD;  Location: [place];  Service: Orthopedics;  Laterality: Left;  . KNEE ARTHROSCOPY Left   . LUMBAR LAMINECTOMY     and fusion x 2  . NASAL SINUS SURGERY    . POPLITEAL SYNOVIAL CYST EXCISION    . REMOVAL OF STONES  04/01/2018   Procedure: REMOVAL OF STONES;  Surgeon: Rush Landmark Telford Nab., MD;  Location: [place];  Service: Gastroenterology;;  . Joan Mayans  04/01/2018   Procedure: Joan Mayans;  Surgeon: Irving Copas., MD;  Location: [place];  Service: Gastroenterology;;   Current Facility-Administered Medications  Medication Dose Route Frequency Provider Last Rate Last Dose  . 0.9 %  sodium chloride infusion   Intravenous Continuous Mansouraty, Telford Nab., MD       Allergies  Allergen Reactions  . Losartan Potassium Other (See Comments)    Hyperkalemia  . Tape Itching and Rash    reddened skin    Family History  Problem Relation Age of Onset  . Melanoma Mother   . Stroke Father   . Hypertension Father   . Coronary artery disease Other   . Colon cancer Neg Hx   . Esophageal cancer Neg Hx   . Stomach cancer  Neg Hx   . Rectal cancer Neg Hx   . Pancreatic cancer Neg Hx   . Liver disease Neg Hx   . Inflammatory bowel disease Neg Hx    Social History   Socioeconomic History  . Marital status: Married    Spouse name: Not on file  . Number of children: Not on file  . Years of education: Not on file  . Highest education level: Not on file  Occupational History  . Not on file  Social Needs  . Financial resource strain: Not on file  . Food insecurity    Worry: Not on  file    Inability: Not on file  . Transportation needs    Medical: Not on file    Non-medical: Not on file  Tobacco Use  . Smoking status: Former Smoker    Packs/day: 0.50    Years: 5.00    Pack years: 2.50    Types: Cigarettes    Quit date: 03/30/1976    Years since quitting: 42.4  . Smokeless tobacco: Never Used  Substance and Sexual Activity  . Alcohol use: No    Alcohol/week: 0.0 standard drinks  . Drug use: No  . Sexual activity: Yes  Lifestyle  . Physical activity    Days per week: Not on file    Minutes per session: Not on file  . Stress: Not on file  Relationships  . Social Herbalist on phone: Not on file    Gets together: Not on file    Attends religious service: Not on file    Active member of club or organization: Not on file    Attends meetings of clubs or organizations: Not on file    Relationship status: Not on file  . Intimate partner violence    Fear of current or ex partner: Not on file    Emotionally abused: Not on file    Physically abused: Not on file    Forced sexual activity: Not on file  Other Topics Concern  . Not on file  Social History Narrative  . Not on file    Physical Exam: Vital signs in last 24 hours: Temp:  [97.7 F (36.5 C)] 97.7 F (36.5 C) (08/24 1123) Pulse Rate:  [64] 64 (08/24 1123) Resp:  [20] 20 (08/24 1123) BP: (161)/(77) 161/77 (08/24 1123) SpO2:  [95 %] 95 % (08/24 1123)   GEN: NAD EYE: Sclerae anicteric ENT: MMM CV: Non-tachycardic GI: Soft, TTP in epigastrium and LQs (7-8/10 currently) NEURO:  Alert & Oriented x 3  Lab Results: No results for input(s): WBC, HGB, HCT, PLT in the last 72 hours. BMET No results for input(s): NA, K, CL, CO2, GLUCOSE, BUN, CREATININE, CALCIUM in the last 72 hours. LFT No results for input(s): PROT, ALBUMIN, AST, ALT, ALKPHOS, BILITOT, BILIDIR, IBILI in the last 72 hours. PT/INR No results for input(s): LABPROT, INR in the last 72 hours.   Impression / Plan: This  is a 78 y.o.male who presents for EUS/ERCP.  The risks and benefits of endoscopic evaluation were discussed with the patient; these include but are not limited to the risk of perforation, infection, bleeding, missed lesions, lack of diagnosis, severe illness requiring hospitalization, as well as anesthesia and sedation related illnesses.  The patient is agreeable to proceed.    Justice Britain, MD [facility] Advanced [facility]oscopy Office # [phone]

## 2018-09-12 NOTE — Op Note (Signed)
[facility] Patient Name: [name] Procedure Date : 09/12/2018 MRN: [id number] Attending MD: Justice Britain , MD Date of Birth: 05-Jan-1941 CSN: [id number] Age: 78 Admit Type: Outpatient Procedure:                ERCP Indications:              Common bile duct stricture, Abnormal abdominal CT,                            Biliary dilation on Computed Tomogram Scan, Stent                            change Providers:                Justice Britain, MD, Vista Lawman, RN, Cletis Athens, Technician Referring MD:             Rayford Halsted. Isaac Bliss Medicines:                General Anesthesia, Cipro 400 mg IV, Indomethacin                            100 mg PR, Glucagon 0.5 mg IV Complications:            No immediate complications. Estimated Blood Loss:     Estimated blood loss was minimal. Procedure:                Pre-Anesthesia Assessment:                           - Prior to the procedure, a History and Physical                            was performed, and patient medications and                            allergies were reviewed. The patient's tolerance of                            previous anesthesia was also reviewed. The risks                            and benefits of the procedure and the sedation                            options and risks were discussed with the patient.                            All questions were answered, and informed consent                            was obtained. Prior Anticoagulants: The patient has  taken no previous anticoagulant or antiplatelet                            agents. ASA Grade Assessment: III - A patient with                            severe systemic disease. After reviewing the risks                            and benefits, the patient was deemed in                            satisfactory condition to undergo the procedure.                           After  obtaining informed consent, the scope was                            passed under direct vision. Throughout the                            procedure, the patient's blood pressure, pulse, and                            oxygen saturations were monitored continuously. The                            TJF-Q180V ([id number]) Olympus duodenoscope was                            introduced through the mouth, and used to inject                            contrast into and used to inject contrast into the                            bile duct. The ERCP was accomplished without                            difficulty. The patient tolerated the procedure. Scope In: Scope Out: Findings:      The scout film was normal - the previously placed stent had been removed       during EUS.      The esophagus was successfully intubated under direct vision without       detailed examination of the pharynx, larynx, and associated structures,       and upper GI tract. There was no stent seen emerging from the major       papilla since it had been removed at time of EUS. A biliary       sphincterotomy had been performed. The sphincterotomy appeared open.      A short 0.035 inch Soft Jagwire was passed into the biliary tree. The       Autotome sphincterotome was passed over the guidewire and the bile duct       was  then deeply cannulated. Contrast was injected. I personally       interpreted the bile duct images. Ductal flow of contrast was adequate.       Image quality was adequate. Contrast extended to the hepatic ducts.       Opacification of the entire biliary tree except for the gallbladder was       successful. The lower third of the main duct contained a single moderate       stenosis 10 mm in length. The main bile duct was severely dilated,       secondary to aforementioned stricture. The largest diameter was 17 mm.       The main bile duct contained filling defects thought to be stones and       sludge. Dilation  of the common bile duct stricture with an 08-27-08 mm       balloon (to a maximum balloon size of 10 mm) dilator was successful. To       discover objects, the biliary tree was swept with a retrieval balloon       starting at the bifurcation. Sludge was swept from the duct. Many stone       fragments were removed. No stones remained. Clots were swept from the       duct. Cells for cytology were obtained by brushing in the lower third of       the main bile duct at area of stenosis. An occlusion cholangiogram was       performed that showed no further significant biliary pathology other       than that noted above. Two plastic biliary stents (10 Fr by 5 cm & 8.5       Fr by 5 cm) with single external flaps and single internal flaps were       placed into the common bile duct. Bile flowed through the stents. The       stents were in good position.      A pancreatogram was not performed.      The duodenoscope was withdrawn from the patient. Impression:               - Previous biliary stent removed at time of EUS.                           - Prior biliary sphincterotomy appeared open.                           - Filling defects consistent with stones and sludge                            were seen on the cholangiogram.                           - A single moderate biliary stricture was found in                            the lower third of the main bile duct. The                            stricture was indeterminate. This stricture was  dilated. This stricture was brushed for cytology.                            Two plastic biliary stents were placed into the                            common bile duct to traverse the stricture.                           - The entire main bile duct was severely dilated,                            secondary to aforementioned stricture.                           - Choledocholithiasis and clot/debris was found.                             Complete removal was accomplished by sweeping after                            sphincteroplasty. Recommendation:           - The patient will be observed post-procedure,                            until all discharge criteria are met.                           - Discharge patient to home.                           - Patient has a contact number available for                            emergencies. The signs and symptoms of potential                            delayed complications were discussed with the                            patient. Return to normal activities tomorrow.                            Written discharge instructions were provided to the                            patient.                           - Low fat diet for 1 week.                           - Watch for pancreatitis, bleeding, perforation,  and cholangitis.                           - Check liver enzymes (AST, ALT, alkaline                            phosphatase, bilirubin) in 1 week.                           - Await cytology results.                           - Repeat ERCP at appointment to be scheduled to                            exchange stent. Depending on results, will consider                            role of Cholangioscopy at next ERCP.                           - The findings and recommendations were discussed                            with the patient.                           - The findings and recommendations were discussed                            with the patient's family. Procedure Code(s):        --- Professional ---                           [phone], Endoscopic retrograde                            cholangiopancreatography (ERCP); with placement of                            endoscopic stent into biliary or pancreatic duct,                            including pre- and post-dilation and guide wire                            passage, when performed, including  sphincterotomy,                            when performed, each stent                           43274, 59, Endoscopic retrograde                            cholangiopancreatography (ERCP); with placement of  endoscopic stent into biliary or pancreatic duct,                            including pre- and post-dilation and guide wire                            passage, when performed, including sphincterotomy,                            when performed, each stent                           43264, Endoscopic retrograde                            cholangiopancreatography (ERCP); with removal of                            calculi/debris from biliary/pancreatic duct(s) Diagnosis Code(s):        --- Professional ---                           K80.51, Calculus of bile duct without cholangitis                            or cholecystitis with obstruction                           Z46.59, Encounter for fitting and adjustment of                            other gastrointestinal appliance and device                           R93.5, Abnormal findings on diagnostic imaging of                            other abdominal regions, including retroperitoneum                           K83.8, Other specified diseases of biliary tract                           R93.2, Abnormal findings on diagnostic imaging of                            liver and biliary tract CPT copyright 2019 American Medical Association. All rights reserved. The codes documented in this report are preliminary and upon coder review may  be revised to meet current compliance requirements. Justice Britain, MD 09/12/2018 3:22:05 PM Number of Addenda: 0

## 2018-09-13 ENCOUNTER — Encounter (HOSPITAL_COMMUNITY): Payer: Self-pay | Admitting: Gastroenterology

## 2018-09-13 ENCOUNTER — Ambulatory Visit
Admission: RE | Admit: 2018-09-13 | Discharge: 2018-09-13 | Disposition: A | Discharge status: Home / Self Care | Payer: Medicare Other | Source: Ambulatory Visit | Attending: Internal Medicine | Admitting: Internal Medicine

## 2018-09-13 ENCOUNTER — Inpatient Hospital Stay: Admission: RE | Admit: 2018-09-13 | Payer: Medicare Other | Source: Ambulatory Visit

## 2018-09-13 DIAGNOSIS — R058 Other specified cough: Secondary | ICD-10-CM

## 2018-09-13 DIAGNOSIS — R05 Cough: Secondary | ICD-10-CM | POA: Diagnosis not present

## 2018-09-13 DIAGNOSIS — R059 Cough, unspecified: Secondary | ICD-10-CM

## 2018-09-13 DIAGNOSIS — R0600 Dyspnea, unspecified: Secondary | ICD-10-CM | POA: Diagnosis not present

## 2018-09-13 NOTE — Anesthesia Postprocedure Evaluation (Signed)
Anesthesia Post Note  Patient: [name]  Procedure(s) Performed: ESOPHAGOGASTRODUODENOSCOPY (EGD) WITH PROPOFOL (N/A ) UPPER ENDOSCOPIC ULTRASOUND (EUS) RADIAL (N/A ) ENDOSCOPIC RETROGRADE CHOLANGIOPANCREATOGRAPHY (ERCP) WITH PROPOFOL (N/A ) STENT REMOVAL BIOPSY FINE NEEDLE ASPIRATION (FNA) LINEAR BILIARY DILATION BILIARY STENT PLACEMENT BILIARY BRUSHING REMOVAL OF STONES ENDOSCOPIC MUCOSAL RESECTION SUBMUCOSAL LIFTING INJECTION HEMOSTASIS CLIP PLACEMENT SAVORY DILATION (N/A )     Patient location during evaluation: PACU Anesthesia Type: General Level of consciousness: sedated Pain management: pain level controlled Vital Signs Assessment: post-procedure vital signs reviewed and stable Respiratory status: spontaneous breathing Cardiovascular status: stable Postop Assessment: no apparent nausea or vomiting Anesthetic complications: no    Last Vitals:  Vitals:   09/12/18 1523 09/12/18 1553  BP: (!) 151/78 139/72  Pulse: 63 65  Resp: 19 14  Temp:    SpO2: 93% 93%    Last Pain:  Vitals:   09/12/18 1553  TempSrc:   PainSc: 0-No pain   Pain Goal:                   Huston Foley

## 2018-09-14 NOTE — Progress Notes (Signed)
ATC, NA and no option to leave VM

## 2018-09-19 ENCOUNTER — Encounter: Payer: Self-pay | Admitting: Gastroenterology

## 2018-09-30 ENCOUNTER — Telehealth: Payer: Self-pay | Admitting: Internal Medicine

## 2018-09-30 NOTE — Telephone Encounter (Signed)
Pt daughter [name] is calling and pt needs a refill on oxycodone and nitroglycerin . [facility] on [address]

## 2018-10-05 MED ORDER — NITROGLYCERIN 0.4 MG SL SUBL: 0.4000 mg | SUBLINGUAL_TABLET | SUBLINGUAL | 3 refills | Status: DC | PRN

## 2018-10-05 NOTE — Telephone Encounter (Signed)
Spoke with pharmacist and patient has 2 prescriptions on file.  1 will be filled today.  Left message on machine for daughter.

## 2018-10-05 NOTE — Telephone Encounter (Signed)
Can we look into this? Looks like he had an OV in August for refills, did we refill 1 or 3 months?

## 2018-10-10 ENCOUNTER — Ambulatory Visit (INDEPENDENT_AMBULATORY_CARE_PROVIDER_SITE_OTHER): Payer: Medicare Other | Admitting: Cardiovascular Disease

## 2018-10-10 ENCOUNTER — Encounter: Payer: Self-pay | Admitting: Cardiovascular Disease

## 2018-10-10 ENCOUNTER — Other Ambulatory Visit: Payer: Self-pay

## 2018-10-10 ENCOUNTER — Telehealth: Payer: Self-pay

## 2018-10-10 DIAGNOSIS — I10 Essential (primary) hypertension: Secondary | ICD-10-CM

## 2018-10-10 DIAGNOSIS — Z951 Presence of aortocoronary bypass graft: Secondary | ICD-10-CM | POA: Diagnosis not present

## 2018-10-10 DIAGNOSIS — R0602 Shortness of breath: Secondary | ICD-10-CM

## 2018-10-10 DIAGNOSIS — N183 Chronic kidney disease, stage 3 unspecified: Secondary | ICD-10-CM

## 2018-10-10 DIAGNOSIS — I2510 Atherosclerotic heart disease of native coronary artery without angina pectoris: Secondary | ICD-10-CM | POA: Diagnosis not present

## 2018-10-10 DIAGNOSIS — E785 Hyperlipidemia, unspecified: Secondary | ICD-10-CM | POA: Diagnosis not present

## 2018-10-10 DIAGNOSIS — J90 Pleural effusion, not elsewhere classified: Secondary | ICD-10-CM

## 2018-10-10 MED ORDER — AMLODIPINE BESYLATE 2.5 MG PO TABS: 2.5000 mg | ORAL_TABLET | Freq: Every day | ORAL | 3 refills | Status: DC

## 2018-10-10 NOTE — Patient Instructions (Addendum)
Medication Instructions:  Start Amlodipine 2.5 mg daily  If you need a refill on your cardiac medications before your next appointment, please call your pharmacy.   Testing/Procedures: Echocardiogram (6 months) - Your physician has requested that you have an echocardiogram. Echocardiography is a painless test that uses sound waves to create images of your heart. It provides your doctor with information about the size and shape of your heart and how well your heart's chambers and valves are working. This procedure takes approximately one hour. There are no restrictions for this procedure. This will be performed at our [facility] location - [address], [address].   Follow-Up: At [facility], you and your health needs are our priority.  As part of our continuing mission to provide you with exceptional heart care, we have created designated Provider Care Teams.  These Care Teams include your primary Cardiologist (physician) and Advanced Practice Providers (APPs -  Physician Assistants and Nurse Practitioners) who all work together to provide you with the care you need, when you need it. You will need a follow up appointment in 6 months.  Please call our office 2 months in advance to schedule this appointment.  You may see [name], MD or one of the following Advanced Practice Providers on your designated Care Team: [place], [place] . Fabian Sharp, PA-C

## 2018-10-10 NOTE — Telephone Encounter (Signed)
Patient was called, reminded of appointment in office for [facility] today. Patient verbalized understanding, will come with mask on.

## 2018-10-10 NOTE — Progress Notes (Signed)
Patient ID: [name], male   DOB: 1940/10/26, 78 y.o.   MRN: [id number]      HPI: [name] is a 78 y.o. male who presents to the office today for a 4 month cardiology followup evaluation.  [name] has known CAD and underwent CABG revascularization surgery in 2001 with a LIMA to his LAD, vein to the marginal, vein to the RCA. Catheterization in 2004 showed patent grafts. An echo Doppler study in August 2012 revealed an ejection fraction of 40-45% with moderate septal hypokinesis with grade 1 diastolic dysfunction as well as aortic valve sclerosis. Additional problems include GERD, mild emphysema, as well as obstructive sleep apnea. He no longer utilizes CPAP. He has lost approximately 25 pounds since his wife's death from a motor vehicle accident in January 2013.  He remains active and notes mild shortness of breath with activity particularly when walking up hills but this has not changed.  He also is a history of restless leg syndrome and has benefited with requip. Laboratory in 2013 demonstrated LDL 57 with LDL particle #1035, small LDL particle number was 611, slightly increased, HDLC was 35 triglycerides 93 total cholesterol 111 initial resistance course slightly elevated at 53.  BP blood work in March 2015 revealed normal renal function.  Fasting glucose was minimally increased at 106.  Total cholesterol was 116 triglycerides 80, and LDL 66, with an HDL of 34.  LDL particle number was slightly increased at 1190.  He undewent a five-year follow-up nuclear perfusion study in November 2016.  Ejection fraction was 54%.  He had normal perfusion.  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  He admits to arthralgias without myalgias.  He has issues with his in his legs and his peripheral neuropathy.  I further titrated his losartan to 75 mg and Bystolic to 10 mg.  He believes his blood pressure and pulse have been better.   He admits to leg discomfort which seems worse with atorvastatin.  When I last saw him, he had noticed some shortness of breath with uphill walking and with exertion but denied recurrent episodes of chest tightness.   He denied PND, orthopnea.  He underwent a follow-up echo Doppler study on 10/28/2016.  Ejection fraction was excellent at 60-65%.  There was grade 2 diastolic dysfunction.  There was evidence for aortic sclerosis without stenosis.  There was mild LA dilation.  He had trivial PR.    I saw him in October 2018. He subsequently  underwent left hand surgery.  His primary care with [name] is retired.  When I last saw him in November 2019 recently, he  noticed progressive increase in shortness of breath and this seems to develop even trying to walk up very small hill.  He denied any definitive chest tightness.  He has started to run out of some of his chronic pain meds which had been prescribed by [name].  He has not yet reestablished with another primary physician.  He was on Bystolic 10 mg and isosorbide 60 mg for his CAD and hypertension.  He was on Zetia 10 mg and rosuvastatin for hyperlipidemia.  He continues to be on chronic aspirin therapy.  He has restless legs and is on Requip.  His previous primary physician had prescribed Effexor in addition to Percocet.  In November 2019, with his change in symptomatology I recommended a follow-up echo Doppler study and a lexiscan  Myoview.  As he had had some issues with elevated potassium on ARB therapy and I started him on HCTZ other than spironolactone.  December 23, 2017 an echo Doppler study showed an EF of 60 to 65% with grade 1 diastolic dysfunction and mild aortic valve stenosis.  A Lexiscan Myoview study on January 04, 2018 was low risk and demonstrated normal perfusion without scar or ischemia.  EF was 51%.  He was hospitalized from March 11 through April 06, 2019 presenting with abdominal discomfort,.  Laboratory revealed  leukocytosis to 21,400, elevated lactic acid of 2.4, elevated lipase at 390 urinalysis notable for proteinuria and a creatinine had risen to 1.59 and there was mild elevation of AST and total bilirubin.  He was ultimately had positive blood cultures for coli to have sepsis and colitis.  An MRCP showed acute pancreatitis, mild gallbladder distention with peri-cholecystic fluid and gallbladder wall thickening.  School he underwent ERCP on April 01, 2018 which showed stricture and sludge without stone he underwent sphincterotomy and stent placement.  He is planned to be on antibiotics for several weeks.  During his hospitalization he developed acute kidney injury with peak creatinine at 2.27.  His medications were adjusted and on discharge creatinine was 1.75.    He was last evaluated by me in a telemedicine evaluation on May 19, 2018.  From a cardiac perspective he felt improved.  He was not having any leg swelling.  He denied any chest pain but continued to experience shortness of breath symptoms.  Creatinine had improved to 1.46.  He continues to have issues with back and leg discomfort and remotely had undergone steroid injections with [name].  Presently, he continues to experience his chronic shortness of breath but that has not increased in severity.  He denies any chest pain.  He has had some issues with swallowing and continues to see GI physician.  He has undergone esophageal dilatation.  He is unaware of any rhythm disturbance.  He has been evaluated by [name] and he was referred for a lower extremity Doppler study which was negative for DVT.  He had been noted to have a left pleural effusion which is followed by 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  He presents for follow-up cardiology evaluation  Past Medical History:  Diagnosis Date  . Allergy   . B12 deficiency anemia   . Blood transfusion without reported diagnosis   . CAD (coronary artery disease)   . Cancer  ([place])    bladder  . Colon polyps   . COPD (chronic obstructive pulmonary disease) ([place])   . Depression   . Esophagus, Barrett's   . GERD (gastroesophageal reflux disease)   . History of bladder cancer    Bladder cancer "8 times"  . Hyperlipidemia   . Hypertension   . Localized osteoarthrosis, lower leg   . Restless leg syndrome   . Sleep apnea    does not wear cpap  . Stenosis of esophagus     Past Surgical History:  Procedure Laterality Date  . BILIARY BRUSHING  04/01/2018   Procedure: BILIARY BRUSHING;  Surgeon: Rush Landmark Telford Nab., MD;  Location: [place];  Service: Gastroenterology;;  . BILIARY BRUSHING  09/12/2018   Procedure: BILIARY BRUSHING;  Surgeon: Irving Copas., MD;  Location: [place];  Service: Gastroenterology;;  . BILIARY DILATION  09/12/2018   Procedure: BILIARY DILATION;  Surgeon: Irving Copas., MD;  Location: [place];  Service: Gastroenterology;;  . BILIARY STENT PLACEMENT  04/01/2018   Procedure: BILIARY STENT PLACEMENT;  Surgeon: Rush Landmark Telford Nab., MD;  Location: [place];  Service: Gastroenterology;;  . BILIARY STENT PLACEMENT  09/12/2018   Procedure: BILIARY STENT PLACEMENT;  Surgeon: Irving Copas., MD;  Location: [place];  Service: Gastroenterology;;  . BIOPSY  04/01/2018   Procedure: BIOPSY;  Surgeon: Irving Copas., MD;  Location: [place];  Service: Gastroenterology;;  . BIOPSY  09/12/2018   Procedure: BIOPSY;  Surgeon: Irving Copas., MD;  Location: [place];  Service: Gastroenterology;;  . bladder cancer    . CERVICAL DISCECTOMY     ACDF  . COLONOSCOPY  11/17/2005   normal   . CORONARY ARTERY BYPASS GRAFT     x4  . ENDOSCOPIC MUCOSAL RESECTION  09/12/2018   Procedure: ENDOSCOPIC MUCOSAL RESECTION;  Surgeon: Rush Landmark Telford Nab., MD;  Location: [facility] ENDOSCOPY;  Service: Gastroenterology;;  . ENDOSCOPIC RETROGRADE CHOLANGIOPANCREATOGRAPHY (ERCP) WITH PROPOFOL N/A  04/01/2018   Procedure: ENDOSCOPIC RETROGRADE CHOLANGIOPANCREATOGRAPHY (ERCP) WITH PROPOFOL;  Surgeon: Irving Copas., MD;  Location: [place];  Service: Gastroenterology;  Laterality: N/A;  . ENDOSCOPIC RETROGRADE CHOLANGIOPANCREATOGRAPHY (ERCP) WITH PROPOFOL N/A 09/12/2018   Procedure: ENDOSCOPIC RETROGRADE CHOLANGIOPANCREATOGRAPHY (ERCP) WITH PROPOFOL;  Surgeon: Rush Landmark Telford Nab., MD;  Location: [place];  Service: Gastroenterology;  Laterality: N/A;  . ESOPHAGOGASTRODUODENOSCOPY  04/29/2010  . ESOPHAGOGASTRODUODENOSCOPY (EGD) WITH PROPOFOL N/A 04/01/2018   Procedure: ESOPHAGOGASTRODUODENOSCOPY (EGD) WITH PROPOFOL;  Surgeon: Rush Landmark Telford Nab., MD;  Location: [place];  Service: Gastroenterology;  Laterality: N/A;  . ESOPHAGOGASTRODUODENOSCOPY (EGD) WITH PROPOFOL N/A 09/12/2018   Procedure: ESOPHAGOGASTRODUODENOSCOPY (EGD) WITH PROPOFOL;  Surgeon: Rush Landmark Telford Nab., MD;  Location: [place];  Service: Gastroenterology;  Laterality: N/A;  . EUS  04/01/2018   Procedure: FULL UPPER ENDOSCOPIC ULTRASOUND (EUS) RADIAL;  Surgeon: Irving Copas., MD;  Location: [place];  Service: Gastroenterology;;  . EUS N/A 09/12/2018   Procedure: UPPER ENDOSCOPIC ULTRASOUND (EUS) RADIAL;  Surgeon: Irving Copas., MD;  Location: [place];  Service: Gastroenterology;  Laterality: N/A;  . FINE NEEDLE ASPIRATION  09/12/2018   Procedure: FINE NEEDLE ASPIRATION (FNA) LINEAR;  Surgeon: Irving Copas., MD;  Location: [place];  Service: Gastroenterology;;  . HEMOSTASIS CLIP PLACEMENT  09/12/2018   Procedure: HEMOSTASIS CLIP PLACEMENT;  Surgeon: Irving Copas., MD;  Location: [facility] ENDOSCOPY;  Service: Gastroenterology;;  . I&D EXTREMITY Left 11/24/2016   Procedure: IRRIGATION AND DEBRIDEMENT LEFT HAND, THUMB, INDEX, MIDDLE, RING, AND SMALL FINGERS WITH RECONSTRUCTION;  Surgeon: Roseanne Kaufman, MD;  Location: [place];  Service: Orthopedics;   Laterality: Left;  . KNEE ARTHROSCOPY Left   . LUMBAR LAMINECTOMY     and fusion x 2  . NASAL SINUS SURGERY    . POPLITEAL SYNOVIAL CYST EXCISION    . REMOVAL OF STONES  04/01/2018   Procedure: REMOVAL OF STONES;  Surgeon: Rush Landmark Telford Nab., MD;  Location: [place];  Service: Gastroenterology;;  . REMOVAL OF STONES  09/12/2018   Procedure: REMOVAL OF STONES;  Surgeon: Irving Copas., MD;  Location: [place];  Service: Gastroenterology;;  . Azzie Almas DILATION N/A 09/12/2018   Procedure: Azzie Almas DILATION;  Surgeon: Irving Copas., MD;  Location: [place];  Service: Gastroenterology;  Laterality: N/A;  . SPHINCTEROTOMY  04/01/2018   Procedure: SPHINCTEROTOMY;  Surgeon: Rush Landmark Telford Nab., MD;  Location: [place];  Service: Gastroenterology;;  . Lavell Islam REMOVAL  09/12/2018   Procedure: STENT REMOVAL;  Surgeon: Irving Copas., MD;  Location: [place];  Service: Gastroenterology;;  . Maryagnes Amos  INJECTION  09/12/2018   Procedure: SUBMUCOSAL LIFTING INJECTION;  Surgeon: Rush Landmark Telford Nab., MD;  Location: [facility] ENDOSCOPY;  Service: Gastroenterology;;    Allergies  Allergen Reactions  . Losartan Potassium Other (See Comments)    Hyperkalemia  . Tape Itching and Rash    reddened skin     Current Outpatient Medications  Medication Sig Dispense Refill  . acetaminophen (TYLENOL) 325 MG tablet Take 650 mg by mouth every 6 (six) hours as needed for mild pain.    [name] aspirin EC 81 MG tablet Take 81 mg by mouth at bedtime.    [name] BYSTOLIC 10 MG tablet Take 10 mg by mouth daily.    . cyanocobalamin (,VITAMIN B-12,) 1000 MCG/ML injection INJECT 1 ML (CC) INTRAMUSCULARLY FOR 1 DOSE (Patient taking differently: Inject 1,000 mcg into the muscle every 30 (thirty) days. ) 6 mL 3  . dicyclomine (BENTYL) 10 MG capsule Take 1 capsule (10 mg total) by mouth every 8 (eight) hours as needed for spasms. 3060 capsule 1  . ezetimibe (ZETIA) 10 MG tablet Take 1  tablet (10 mg total) by mouth daily. 90 tablet 1  . folic acid (FOLVITE) 631 MCG tablet Take 400 mcg by mouth daily.      . isosorbide mononitrate (IMDUR) 60 MG 24 hr tablet Take 1 tablet (60 mg total) by mouth daily. 90 tablet 1  . Menthol, Topical Analgesic, (BENGAY EX) Apply 1 application topically daily as needed (pain).    [name] NEEDLE, DISP, 25 G (B-D DISP NEEDLE 25GX1") 25G X 1" MISC Inject 1000 mcg into muscle once a month. 50 each 0  . nitroGLYCERIN (NITROSTAT) 0.4 MG SL tablet Place 1 tablet (0.4 mg total) under the tongue every 5 (five) minutes as needed for chest pain. 30 tablet 3  . oxyCODONE-acetaminophen (PERCOCET) 10-325 MG tablet Take 1 tablet by mouth 2 (two) times daily.    . pantoprazole (PROTONIX) 40 MG tablet Take 30- 60 min before your first and last meals of the day (Patient taking differently: Take 40 mg by mouth daily. ) 60 tablet 3  . Probiotic Product (PROBIOTIC ADVANCED PO) Take 1 tablet by mouth 2 (two) times daily.     [name] rOPINIRole (REQUIP) 2 MG tablet Take 1 tablet (2 mg total) by mouth at bedtime. 90 tablet 1  . rosuvastatin (CRESTOR) 20 MG tablet Take 20 mg by mouth daily.    [name] venlafaxine (EFFEXOR) 75 MG tablet Take 1 tablet (75 mg total) by mouth daily. 90 tablet 1  . amLODipine (NORVASC) 2.5 MG tablet Take 1 tablet (2.5 mg total) by mouth daily. 180 tablet 3   No current facility-administered medications for this visit.    Socially he is widowed in January 2013. He has 3 children 6 grandchildren. 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 There is no recent tobacco use.  no alcohol.  His daughter is the wife of my patient Mr. Theodosia Paling.  ROS General: Negative; No fevers, chills, or night sweats;  HEENT: Negative; No changes in vision or hearing, sinus congestion, difficulty swallowing Pulmonary: Negative; No cough, wheezing, shortness of breath, hemoptysis Cardiovascular:  Negative; No chest pain, presyncope, syncope, palpatations.  Minimal shortness of breath with activity GI: GERD; recent pancreatitis and septic cholangitis GU: Recent acute kidney injury Musculoskeletal: Positive for multiple joint aches involving his legs, hips, back, and shoulder Hematologic/Oncology: Negative; no easy bruising, bleeding Endocrine: Negative;  no heat/cold intolerance; no diabetes Neuro: Negative; no changes in balance, headaches Skin: Negative; No rashes or skin lesions Psychiatric: Negative; No behavioral problems, depression Sleep: He no longer uses CPAP with his 25 pound weight loss.  His restless legs improved with low-dose Requip; No snoring, daytime sleepiness, hypersomnolence, bruxism, hypnogognic hallucinations, no cataplexy Other comprehensive 14 point system review is negative.   PE BP (!) 147/74   Pulse 63   Ht '5\' 9"'$  (1.753 m)   Wt 181 lb (82.1 kg)   BMI 26.73 kg/m    Repeat blood pressure by me was 142/70  Wt Readings from Last 3 Encounters:  10/10/18 181 lb (82.1 kg)  09/07/18 179 lb 9.6 oz (81.5 kg)  07/27/18 176 lb (79.8 kg)   General: Alert, oriented, no distress.  Skin: normal turgor, no rashes, warm and dry HEENT: Normocephalic, atraumatic. Pupils equal round and reactive to light; sclera anicteric; extraocular muscles intact;  Nose without nasal septal hypertrophy Mouth/Parynx benign; Mallinpatti scale 3 Neck: No JVD, no carotid bruits; normal carotid upstroke Lungs: Slightly decreased breath sounds at the base, no wheezing Chest wall: without tenderness to palpitation Heart: PMI not displaced, RRR, s1 s2 normal, 1/6 systolic murmur, no diastolic murmur, no rubs, gallops, thrills, or heaves Abdomen: soft, nontender; no hepatosplenomehaly, BS+; abdominal aorta nontender and not dilated by palpation. Back: no CVA tenderness Pulses 2+ Musculoskeletal: full range of motion, normal strength, no joint deformities Extremities: no clubbing  cyanosis or edema, Homan's sign negative  Neurologic: grossly nonfocal; Cranial nerves grossly wnl Psychologic: Normal mood and affect    ECG (independently read by me): Sinus rhythm with mild sinus arrhythmia at 71 bpm.  QTc interval 465 ms.  No significant ST changes.  No ectopy.  December 13, 2017 ECG (independently read by me): NSR at 64; IRBBB; normal intervals  July 2018 ECG (independently read by me): Sinus bradycardia 53 bpm.  No ectopy.  Normal intervals.  No ST segment changes.  December 2017 ECG (independently read by me): Normal sinus rhythm at 74 bpm.  No ectopy.  Normal intervals.  No ST segment changes.  October 2016 ECG (independently read by me): Normal sinus rhythm at 61 bpm.  No ectopy.  Normal intervals.  May 2016 ECG (independently read by me): Sinus bradycardia 56 bpm.  No ectopy.  Mild RV conduction delay.  QTc interval 395  ECG (independently read by me): sinus bradycardia 56 bpm.  Mild RV conduction delay.  Borderline first-degree AV block with a PR interval at 208 ms.  No ectopy.  Prior 06/05/2013 ECG (independently read by me): Normal sinus rhythm at 63 beats per minute.  No ectopy; normal intervals.  Prior November 2014 ECG: Sinus rhythm at 68beats per minute. There is mild RV conduction delay. Intervals were normal.  LABS:  BMP Latest Ref Rng & Units 07/27/2018 05/18/2018 04/07/2018  Glucose 70 - 99 mg/dL 164(H) 171(H) 160(H)  BUN 6 - 23 mg/dL 23 15 30(H)  Creatinine 0.40 - 1.50 mg/dL 1.64(H) 1.46 1.71(H)  BUN/Creat Ratio 10 - 24 - - 18  Sodium 135 - 145 mEq/L 139 140 143  Potassium 3.5 - 5.1 mEq/L 4.2 4.5 5.1  Chloride 96 - 112 mEq/L 101 104 101  CO2 19 - 32 mEq/L '30 29 25  '$ Calcium 8.4 - 10.5 mg/dL 9.1 8.8 8.1(L)   Hepatic Function Latest Ref Rng & Units 05/18/2018 04/07/2018 04/02/2018  Total Protein 6.0 - 8.3 g/dL 7.3 6.3 5.6(L)  Albumin 3.5 - 5.2 g/dL 3.9 3.3(L)  2.3(L)  AST 0 - 37 U/L 14 27 49(H)  ALT 0 - 53 U/L '8 18 22  '$ Alk Phosphatase 39 - 117 U/L  93 99 47  Total Bilirubin 0.2 - 1.2 mg/dL 0.4 0.4 0.6  Bilirubin, Direct 0.0 - 0.3 mg/dL - - -    CBC Latest Ref Rng & Units 07/27/2018 05/18/2018 04/04/2018  WBC 4.0 - 10.5 K/uL 8.4 6.5 11.7(H)  Hemoglobin 13.0 - 17.0 g/dL 12.6(L) 12.2(L) 9.7(L)  Hematocrit 39.0 - 52.0 % 37.5(L) 35.4(L) 30.4(L)  Platelets 150.0 - 400.0 K/uL 244.0 147.0(L) 130(L)     Lipid Panel     Component Value Date/Time   CHOL 80 03/31/2018 0508   CHOL 99 (L) 12/24/2017 0819   CHOL 116 03/30/2013 1053   TRIG 72 03/31/2018 0508   TRIG 80 03/30/2013 1053   HDL 32 (L) 03/31/2018 0508   HDL 28 (L) 12/24/2017 0819   HDL 34 (L) 03/30/2013 1053   CHOLHDL 2.5 03/31/2018 0508   VLDL 14 03/31/2018 0508   LDLCALC 34 03/31/2018 0508   LDLCALC 32 12/24/2017 0819   LDLCALC 66 03/30/2013 1053     RADIOLOGY: No results found.  IMPRESSION:  1. Coronary artery disease involving native coronary artery of native heart without angina pectoris   2. Hx of CABG   3. Essential hypertension   4. Shortness of breath   5. Hyperlipidemia with target LDL less than 70   6. CRI (chronic renal insufficiency), stage 3 (moderate) (HCC)   7. Pleural effusion on left     ASSESSMENT AND PLAN:  Mr. Craighead is a 78 year old Caucasian male who has known CAD and is 19 years status post his CABG revascularization surgery in 2001. His last catheterization in 2004 for recurrent chest pain continued to show patent grafts. His ejection fraction of 40-45% was documented in August 2012.  In October 2018 an echo revealed an EF of 60 to 65% with grade 2 diastolic dysfunction, aortic valve sclerosis without stenosis mild left atrial dilation.   His most recent echo in December 2019 continue to show an EF of 60 to 65%.  There was now grade 1 diastolic dysfunction.  There was mild aortic stenosis. A Lexiscan Myoview study on January 04, 2018 was low risk and showed an EF of 51% without ischemia.  I extensively reviewed his most recent hospitalization  where he was remitted with pancreatitis and felt to have a sending cholangitis with E. coli sepsis.  He underwent sphincterotomy and stent placement in his bile duct by Dr. Justice Britain.  He has continued to have issues with significant shortness of breath which is stable but chronic.  He is followed by [name].  There was some discussion of possibly undergoing a left thoracentesis but ultimately this was not done.  His blood pressure today is mildly elevated his regimen consisting of Bystolic 10 mg, isosorbide 60 mg.  I am adding amlodipine initially at very low dose of 2.5 mg and attempt to achieve a target of less than 834 systolic.  He is not having any anginal symptoms.  He continues to experience of mid epigastric discomfort and has had some issues with swallowing for which she will be following up with his GI physicians.  He continues to be on Zetia and rosuvastatin for hyperlipidemia.  He has stage III chronic kidney disease with creatinine 1.64 July 2020.  He will be following up with [name] next week and in 2 weeks with Dr. Rush Landmark.  I have recommended that he undergo a follow-up echo Doppler study in March 2021 and will see him following that study for further evaluation.  Time spent: 25 minutes Troy Sine, MD, [facility]  10/12/2018 4:26 PM

## 2018-10-17 ENCOUNTER — Ambulatory Visit: Payer: Medicare Other | Admitting: Internal Medicine

## 2018-10-25 ENCOUNTER — Ambulatory Visit: Payer: Medicare Other | Admitting: Internal Medicine

## 2018-10-25 ENCOUNTER — Encounter: Payer: Self-pay | Admitting: Gastroenterology

## 2018-10-25 ENCOUNTER — Other Ambulatory Visit: Payer: Self-pay

## 2018-10-25 ENCOUNTER — Other Ambulatory Visit (INDEPENDENT_AMBULATORY_CARE_PROVIDER_SITE_OTHER): Payer: Medicare Other

## 2018-10-25 ENCOUNTER — Ambulatory Visit: Payer: Medicare Other | Admitting: Gastroenterology

## 2018-10-25 VITALS — BP 128/60 | HR 68 | Temp 97.4°F | Ht 69.0 in | Wt 181.4 lb

## 2018-10-25 DIAGNOSIS — K8050 Calculus of bile duct without cholangitis or cholecystitis without obstruction: Secondary | ICD-10-CM | POA: Diagnosis not present

## 2018-10-25 DIAGNOSIS — R1319 Other dysphagia: Secondary | ICD-10-CM

## 2018-10-25 DIAGNOSIS — K831 Obstruction of bile duct: Secondary | ICD-10-CM

## 2018-10-25 DIAGNOSIS — Z9889 Other specified postprocedural states: Secondary | ICD-10-CM

## 2018-10-25 DIAGNOSIS — K2270 Barrett's esophagus without dysplasia: Secondary | ICD-10-CM | POA: Diagnosis not present

## 2018-10-25 DIAGNOSIS — K838 Other specified diseases of biliary tract: Secondary | ICD-10-CM

## 2018-10-25 DIAGNOSIS — K22719 Barrett's esophagus with dysplasia, unspecified: Secondary | ICD-10-CM | POA: Diagnosis not present

## 2018-10-25 DIAGNOSIS — R1310 Dysphagia, unspecified: Secondary | ICD-10-CM

## 2018-10-25 LAB — HEPATIC FUNCTION PANEL
ALT: 7 U/L (ref 0–53)
AST: 15 U/L (ref 0–37)
Albumin: 4.1 g/dL (ref 3.5–5.2)
Alkaline Phosphatase: 73 U/L (ref 39–117)
Bilirubin, Direct: 0.1 mg/dL (ref 0.0–0.3)
Total Bilirubin: 0.4 mg/dL (ref 0.2–1.2)
Total Protein: 7.7 g/dL (ref 6.0–8.3)

## 2018-10-25 LAB — LIPASE: Lipase: 35 U/L (ref 11.0–59.0)

## 2018-10-25 NOTE — Patient Instructions (Addendum)
Your provider has requested that you go to the basement level for lab work before leaving today. Press "B" on the elevator. The lab is located at the first door on the left as you exit the elevator.  You have been scheduled for an endoscopy+ERCP. Please follow written instructions given to you at your visit today. If you use inhalers (even only as needed), please bring them with you on the day of your procedure.  Increase Protonix to twice daily for the next month.   Thank you for choosing me and [facility].  Dr. Rush Landmark

## 2018-10-26 ENCOUNTER — Encounter: Payer: Self-pay | Admitting: Gastroenterology

## 2018-10-26 LAB — CANCER ANTIGEN 19-9: CA 19-9: 6 U/mL (ref <34)

## 2018-10-26 LAB — IGG 4: IgG, Subclass 4: 128 mg/dL — ABNORMAL HIGH (ref 2–96)

## 2018-10-26 NOTE — Progress Notes (Signed)
[place] VISIT   Primary Care Provider Isaac Bliss, Rayford Halsted, MD [place] [address] [phone]  Patient Profile: [name] is a 78 y.o. male with a pmh significant for CAD, COPD, MDD, hypertension, hyperlipidemia, bladder cancer, sleep apnea, restless leg syndrome, Barrett's esophagus without dysplasia, chronic abdominal pain, recent pancreatitis-gallstone related but still has gallbladder in place, status ERCP with distal biliary stricture and choledocholithiasis.  The patient presents to the [facility] for an evaluation and management of problem(s) noted below:  Problem List 1. Biliary stricture   2. Choledocholithiasis   3. Barrett's esophagus without dysplasia   4. History of ERCP   5. Dilation of biliary tract   6. Esophageal dysphagia     History of Present Illness Please see initial consultation note from Dr. Havery Moros and hospital notes from March 2020 and my recent progress notes for full details of HPI.  Interval History The patient returns for scheduled follow-up post recent outpatient EUS and ERCP.  Results of both of those procedures are noted below.  The patient continues to have his multitude of symptoms of lower abdominal discomfort as well as his discomfort in his legs bilaterally.  Weight has been stable overall.  He is taking his acid reducing medication daily.  Has not had significant right upper quadrant abdominal discomfort at this point.  Denies any jaundice or darkened urine.  No pruritus.  No nausea or vomiting.  His dysphagia has improved with our dilation.  GI Review of Systems Positive as above Negative for odynophagia, change in bowel habits, melena, hematochezia  Review of Systems General: Denies fevers/chills/weight loss Cardiovascular: Denies chest pain Pulmonary: Denies shortness of breath Gastroenterological: See HPI Genitourinary: Denies darkened  urine Hematological: Denies easy bruising/bleeding Dermatological: Denies jaundice Psychological: Mood is stable   Medications Current Outpatient Medications  Medication Sig Dispense Refill   acetaminophen (TYLENOL) 325 MG tablet Take 650 mg by mouth every 6 (six) hours as needed for mild pain.     amLODipine (NORVASC) 2.5 MG tablet Take 1 tablet (2.5 mg total) by mouth daily. 180 tablet 3   aspirin EC 81 MG tablet Take 81 mg by mouth at bedtime.     BYSTOLIC 10 MG tablet Take 10 mg by mouth daily.     cyanocobalamin (,VITAMIN B-12,) 1000 MCG/ML injection INJECT 1 ML (CC) INTRAMUSCULARLY FOR 1 DOSE (Patient taking differently: Inject 1,000 mcg into the muscle every 30 (thirty) days. ) 6 mL 3   dicyclomine (BENTYL) 10 MG capsule Take 1 capsule (10 mg total) by mouth every 8 (eight) hours as needed for spasms. 3060 capsule 1   ezetimibe (ZETIA) 10 MG tablet Take 1 tablet (10 mg total) by mouth daily. 90 tablet 1   folic acid (FOLVITE) A999333 MCG tablet Take 400 mcg by mouth daily.       isosorbide mononitrate (IMDUR) 60 MG 24 hr tablet Take 1 tablet (60 mg total) by mouth daily. 90 tablet 1   Menthol, Topical Analgesic, (BENGAY EX) Apply 1 application topically daily as needed (pain).     NEEDLE, DISP, 25 G (B-D DISP NEEDLE 25GX1") 25G X 1" MISC Inject 1000 mcg into muscle once a month. 50 each 0   nitroGLYCERIN (NITROSTAT) 0.4 MG SL tablet Place 1 tablet (0.4 mg total) under the tongue every 5 (five) minutes as needed for chest pain. 30 tablet 3   oxyCODONE-acetaminophen (PERCOCET) 10-325 MG tablet Take 1 tablet by mouth 2 (two) times daily.  pantoprazole (PROTONIX) 40 MG tablet Take 30- 60 min before your first and last meals of the day (Patient taking differently: Take 40 mg by mouth daily. ) 60 tablet 3   Probiotic Product (PROBIOTIC ADVANCED PO) Take 1 tablet by mouth 2 (two) times daily.      rOPINIRole (REQUIP) 2 MG tablet Take 1 tablet (2 mg total) by mouth at bedtime.  90 tablet 1   rosuvastatin (CRESTOR) 20 MG tablet Take 20 mg by mouth daily.     venlafaxine (EFFEXOR) 75 MG tablet Take 1 tablet (75 mg total) by mouth daily. 90 tablet 1   No current facility-administered medications for this visit.     Allergies Allergies  Allergen Reactions   Losartan Potassium Other (See Comments)    Hyperkalemia   Tape Itching and Rash    reddened skin     Histories Past Medical History:  Diagnosis Date   Allergy    B12 deficiency anemia    Blood transfusion without reported diagnosis    CAD (coronary artery disease)    Cancer (HCC)    bladder   Colon polyps    COPD (chronic obstructive pulmonary disease) (HCC)    Depression    Esophagus, Barrett's    GERD (gastroesophageal reflux disease)    History of bladder cancer    Bladder cancer "8 times"   Hyperlipidemia    Hypertension    Localized osteoarthrosis, lower leg    Restless leg syndrome    Sleep apnea    does not wear cpap   Stenosis of esophagus    Past Surgical History:  Procedure Laterality Date   BILIARY BRUSHING  04/01/2018   Procedure: BILIARY BRUSHING;  Surgeon: Irving Copas., MD;  Location: [facility] ENDOSCOPY;  Service: Gastroenterology;;   BILIARY BRUSHING  09/12/2018   Procedure: BILIARY BRUSHING;  Surgeon: Irving Copas., MD;  Location: [place];  Service: Gastroenterology;;   BILIARY DILATION  09/12/2018   Procedure: BILIARY DILATION;  Surgeon: Irving Copas., MD;  Location: [place];  Service: Gastroenterology;;   BILIARY STENT PLACEMENT  04/01/2018   Procedure: BILIARY STENT PLACEMENT;  Surgeon: Irving Copas., MD;  Location: [place];  Service: Gastroenterology;;   BILIARY STENT PLACEMENT  09/12/2018   Procedure: BILIARY STENT PLACEMENT;  Surgeon: Irving Copas., MD;  Location: [place];  Service: Gastroenterology;;   BIOPSY  04/01/2018   Procedure: BIOPSY;  Surgeon: Irving Copas., MD;   Location: [place];  Service: Gastroenterology;;   BIOPSY  09/12/2018   Procedure: BIOPSY;  Surgeon: Irving Copas., MD;  Location: [facility] ENDOSCOPY;  Service: Gastroenterology;;   bladder cancer     CERVICAL DISCECTOMY     ACDF   COLONOSCOPY  11/17/2005   normal    CORONARY ARTERY BYPASS GRAFT     x4   ENDOSCOPIC MUCOSAL RESECTION  09/12/2018   Procedure: ENDOSCOPIC MUCOSAL RESECTION;  Surgeon: Irving Copas., MD;  Location: [facility] ENDOSCOPY;  Service: Gastroenterology;;   ENDOSCOPIC RETROGRADE CHOLANGIOPANCREATOGRAPHY (ERCP) WITH PROPOFOL N/A 04/01/2018   Procedure: ENDOSCOPIC RETROGRADE CHOLANGIOPANCREATOGRAPHY (ERCP) WITH PROPOFOL;  Surgeon: Irving Copas., MD;  Location: [place];  Service: Gastroenterology;  Laterality: N/A;   ENDOSCOPIC RETROGRADE CHOLANGIOPANCREATOGRAPHY (ERCP) WITH PROPOFOL N/A 09/12/2018   Procedure: ENDOSCOPIC RETROGRADE CHOLANGIOPANCREATOGRAPHY (ERCP) WITH PROPOFOL;  Surgeon: Rush Landmark Telford Nab., MD;  Location: [place];  Service: Gastroenterology;  Laterality: N/A;   ESOPHAGOGASTRODUODENOSCOPY  04/29/2010   ESOPHAGOGASTRODUODENOSCOPY (EGD) WITH PROPOFOL N/A 04/01/2018   Procedure: ESOPHAGOGASTRODUODENOSCOPY (EGD) WITH PROPOFOL;  Surgeon: Irving Copas., MD;  Location: [place];  Service: Gastroenterology;  Laterality: N/A;   ESOPHAGOGASTRODUODENOSCOPY (EGD) WITH PROPOFOL N/A 09/12/2018   Procedure: ESOPHAGOGASTRODUODENOSCOPY (EGD) WITH PROPOFOL;  Surgeon: Rush Landmark Telford Nab., MD;  Location: [place];  Service: Gastroenterology;  Laterality: N/A;   EUS  04/01/2018   Procedure: FULL UPPER ENDOSCOPIC ULTRASOUND (EUS) RADIAL;  Surgeon: Irving Copas., MD;  Location: [place];  Service: Gastroenterology;;   EUS N/A 09/12/2018   Procedure: UPPER ENDOSCOPIC ULTRASOUND (EUS) RADIAL;  Surgeon: Irving Copas., MD;  Location: [place];  Service: Gastroenterology;  Laterality: N/A;   FINE  NEEDLE ASPIRATION  09/12/2018   Procedure: FINE NEEDLE ASPIRATION (FNA) LINEAR;  Surgeon: Irving Copas., MD;  Location: [place];  Service: Gastroenterology;;   HEMOSTASIS CLIP PLACEMENT  09/12/2018   Procedure: HEMOSTASIS CLIP PLACEMENT;  Surgeon: Irving Copas., MD;  Location: [facility] ENDOSCOPY;  Service: Gastroenterology;;   I&D EXTREMITY Left 11/24/2016   Procedure: IRRIGATION AND DEBRIDEMENT LEFT HAND, THUMB, INDEX, MIDDLE, RING, AND SMALL FINGERS WITH RECONSTRUCTION;  Surgeon: Roseanne Kaufman, MD;  Location: [place];  Service: Orthopedics;  Laterality: Left;   KNEE ARTHROSCOPY Left    LUMBAR LAMINECTOMY     and fusion x 2   NASAL SINUS SURGERY     POPLITEAL SYNOVIAL CYST EXCISION     REMOVAL OF STONES  04/01/2018   Procedure: REMOVAL OF STONES;  Surgeon: Rush Landmark Telford Nab., MD;  Location: [place];  Service: Gastroenterology;;   REMOVAL OF STONES  09/12/2018   Procedure: REMOVAL OF STONES;  Surgeon: Irving Copas., MD;  Location: [place];  Service: Gastroenterology;;   Azzie Almas DILATION N/A 09/12/2018   Procedure: Azzie Almas DILATION;  Surgeon: Irving Copas., MD;  Location: [place];  Service: Gastroenterology;  Laterality: N/A;   SPHINCTEROTOMY  04/01/2018   Procedure: SPHINCTEROTOMY;  Surgeon: Mansouraty, Telford Nab., MD;  Location: [place];  Service: Gastroenterology;;   Lavell Islam REMOVAL  09/12/2018   Procedure: STENT REMOVAL;  Surgeon: Irving Copas., MD;  Location: [place];  Service: Gastroenterology;;   SUBMUCOSAL LIFTING INJECTION  09/12/2018   Procedure: SUBMUCOSAL LIFTING INJECTION;  Surgeon: Irving Copas., MD;  Location: [facility] ENDOSCOPY;  Service: Gastroenterology;;   Social History   Socioeconomic History   Marital status: Married    Spouse name: Not on file   Number of children: Not on file   Years of education: Not on file   Highest education level: Not on file  Occupational History   Not  on file  Social Needs   Financial resource strain: Not on file   Food insecurity    Worry: Not on file    Inability: Not on file   Transportation needs    Medical: Not on file    Non-medical: Not on file  Tobacco Use   Smoking status: Former Smoker    Packs/day: 0.50    Years: 5.00    Pack years: 2.50    Types: Cigarettes    Quit date: 03/30/1976    Years since quitting: 42.6   Smokeless tobacco: Never Used  Substance and Sexual Activity   Alcohol use: No    Alcohol/week: 0.0 standard drinks   Drug use: No   Sexual activity: Yes  Lifestyle   Physical activity    Days per week: Not on file    Minutes per session: Not on file   Stress: Not on file  Relationships   Social connections    Talks on phone: Not on  file    Gets together: Not on file    Attends religious service: Not on file    Active member of club or organization: Not on file    Attends meetings of clubs or organizations: Not on file    Relationship status: Not on file   Intimate partner violence    Fear of current or ex partner: Not on file    Emotionally abused: Not on file    Physically abused: Not on file    Forced sexual activity: Not on file  Other Topics Concern   Not on file  Social History Narrative   Not on file   Family History  Problem Relation Age of Onset   Melanoma Mother    Stroke Father    Hypertension Father    Coronary artery disease Other    Colon cancer Neg Hx    Esophageal cancer Neg Hx    Stomach cancer Neg Hx    Rectal cancer Neg Hx    Pancreatic cancer Neg Hx    Liver disease Neg Hx    Inflammatory bowel disease Neg Hx    I have reviewed his medical, social, and family history in detail and updated the electronic medical record as necessary.    PHYSICAL EXAMINATION  Telehealth visit But no significant issues on visual media today   REVIEW OF DATA  I reviewed the following data at the time of this encounter:  GI Procedures and Studies    August 2020 ERCP - Previous biliary stent removed at time of EUS. - Prior biliary sphincterotomy appeared open. - Filling defects consistent with stones and sludge were seen on the cholangiogram. - A single moderate biliary stricture was found in the lower third of the main bile duct. The stricture was indeterminate. This stricture was dilated. This stricture was brushed for cytology. Two plastic biliary stents were placed into the common bile duct to traverse the stricture. - The entire main bile duct was severely dilated, secondary to aforementioned stricture. - Choledocholithiasis and clot/debris was found. Complete removal was accomplished by sweeping after sphincteroplasty.  August 2020 EUS EGD Impression: - No gross lesions in proximal/middle esophagus. Salmon-colored mucosa suspicious for short-segment Barrett's esophagus. Biopsied. Dilation of esophagus performed. - Gastritis. Biopsied. - A single gastric polyp. Resected and retrieved via mucosal resection. Clips (MR conditional) were placed to close the defect. - No gross lesions in the duodenal bulb, in the first portion of the duodenum and in the second portion of the duodenum. Biopsied for enteropathy/celiac rule out. - Plastic biliary stent in the duodenum. Removed. - Patent sphincterotomy was found. EUS Impression: - There was a suggestion of a stricture in the lower third of the main bile duct. Fine needle biopsy performed of the region (although no overt mass was noted significant thickening present). - Multiple stones were visualized endosonographically in the common bile duct. - There was dilation in the common bile duct and in the common hepatic duct which measured up to 17 mm. - The pancreatic duct had a normal endosonographic appearance in the pancreatic head, genu of the pancreas, body of the pancreas and tail of the pancreas. - Pancreatic parenchymal abnormalities consisting of diffuse echogenicity and lobularity were  noted in the entire pancreas. - No malignant-appearing lymph nodes were visualized in the celiac region (level 20), peripancreatic region and porta hepatis region.  Diagnosis 1. Duodenum, Biopsy - BENIGN SMALL BOWEL MUCOSA. - NO ACTIVE INFLAMMATION OR VILLOUS ATROPHY IDENTIFIED. 2. Stomach, biopsy - CHRONIC INACTIVE  GASTRITIS. - THERE IS NO EVIDENCE OF HELICOBACTER PYLORI, DYSPLASIA OR MALIGNANCY. - SEE COMMENT. 3. Esophagus, biopsy - INTESTINAL METAPLASIA (GOBLET CELL METAPLASIA) CONSISTENT WITH BARRETT'S ESOPHAGUS. - THERE IS NO EVIDENCE OF DYSPLASIA OR MALIGNANCY. 4. Esophagus, biopsy, random - BENIGN SQUAMOUS MUCOSA. - THERE IS NO EVIDENCE OF SIGNIFICANT INFLAMMATION, DYSPLASIA, OR MALIGNANCY. 5. Stomach, biopsy, pre-pyloric - ULCERATED HYPERPLASTIC POLYP(S). - THERE IS NO EVIDENCE OF MALIGNANCY.  Thank you  Diagnosis BILE DUCT BRUSHING, BILIARY STENT, A (SPECIMEN 1 OF 3, COLLECTED 09/12/18): BENIGN REACTIVE/REPARATIVE CHANGES.  Diagnosis BILE DUCT BRUSHING SPECIMEN B, DISTAL CBD STRICTURE (SPECIMEN 2 OF 3 COLLECTED 09/12/2018) BENIGN REACTIVE/REPARATIVE CHANGES.   Diagnosis BILE DUCT BRUSHING, C (SPECIMEN 3 OF 3, COLLECTED 09/12/18): BENIGN REACTIVE/REPARATIVE CHANGES  Laboratory Studies  Reviewed in epic  Imaging Studies  May 2020 CT abdomen pelvis with without contrast  IMPRESSION: 1. Resolution of peripancreatic inflammatory changes since previous study. No evidence of pancreatic mass, ductal dilatation, or pseudocysts. 2. Internal biliary stent in place, with stable mild biliary ductal dilatation.   ASSESSMENT  Mr. Kuruc is a 78 y.o. male  with a pmh significant for CAD, COPD, MDD, hypertension, hyperlipidemia, bladder cancer, sleep apnea, restless leg syndrome, Barrett's esophagus without dysplasia, chronic abdominal pain, recent pancreatitis-gallstone related but still has gallbladder in place, status ERCP with distal biliary stricture and  choledocholithiasis.  The patient is seen today for evaluation and management of:  1. Biliary stricture   2. Choledocholithiasis   3. Barrett's esophagus without dysplasia   4. History of ERCP   5. Dilation of biliary tract   6. Esophageal dysphagia    Overall, the patient is hemodynamically and clinically stable.  His dysphagia has improved with recent dilation at time of recent ERCP.  We will plan to repeat dilation at next procedure.  The patient's episode of pancreatitis earlier this year was most definitely choledocholithiasis related.  His recent ERCP with stone extraction is evidence of this.  He has significant sludge in his gallbladder as well.  As such I do think at some point the patient should undergo a cholecystectomy.  While I am trying to still better evaluate the patient's biliary narrowing it may be reasonable for [place] to plan a spyglass scope as I had previously indicated to try and further identify understand this distal biliary stricture and try and get some further biopsies.  His biopsies via EUS showed no evidence of true mass or lesion and have basically just shown reparative or inflammatory regions and issues.  As such I think if we do 1 further repeat ERCP and we still have nothing that shows any further evidence of atypical cells with his most recent imaging showing no evidence of a mass or lesion I think it would be reasonable for him to be reevaluated by surgery to consider a cholecystectomy.  I will try to rule out autoimmune cholangiopathy via sending IgG4.  I also would like to consider sending a CA-19-9 which I discussed with the patient is a marker that can be seen in bile duct cancers as well as pancreas cancers and if it is very high would make [place] more concerned about the possibility of this but it was low would make his feel little bit more comfortable about this likely be biliary narrowing as a result of prior stone disease.  I will send a note to the surgeon who had  evaluated him previously in the hospital months ago to bring her up-to-date and see about her thoughts of potential  cholecystectomy in the future.   The risks and benefits of endoscopic evaluation were discussed with the patient; these include but are not limited to the risk of perforation, infection, bleeding, missed lesions, lack of diagnosis, severe illness requiring hospitalization, as well as anesthesia and sedation related illnesses.  The patient is agreeable to proceed.   The risks of an ERCP were discussed at length, including but not limited to the risk of perforation, bleeding, abdominal pain, post-ERCP pancreatitis (while usually mild can be severe and even life threatening).  All patient questions were answered, to the best of my ability, and the patient agrees to the aforementioned plan of action with follow-up as indicated.   PLAN  Laboratories as outlined below ERCP with attempt at spyglass and repeat brushings and dilation EGD at time of ERCP with dilation repeat Repeat biopsies at time of EGD of Barrett's to ensure no evidence of dysplasia and then can put on appropriate surveillance protocol Send updated note to surgeon who had previously evaluated patient for role of cholecystectomy in near future   Orders Placed This Encounter  Procedures   Procedural/ Surgical Case Request: ENDOSCOPIC RETROGRADE CHOLANGIOPANCREATOGRAPHY (ERCP) +EGD with spyglass   Lipase   Hepatic function panel   Cancer antigen 19-9   IgG 4   Ambulatory referral to Gastroenterology    New Prescriptions   No medications on file   Modified Medications   No medications on file    Planned Follow Up No follow-ups on file.   Justice Britain, MD [facility] # [phone]

## 2018-10-27 DIAGNOSIS — K838 Other specified diseases of biliary tract: Secondary | ICD-10-CM | POA: Insufficient documentation

## 2018-10-27 DIAGNOSIS — K8050 Calculus of bile duct without cholangitis or cholecystitis without obstruction: Secondary | ICD-10-CM | POA: Insufficient documentation

## 2018-10-27 DIAGNOSIS — R1310 Dysphagia, unspecified: Secondary | ICD-10-CM | POA: Insufficient documentation

## 2018-10-27 DIAGNOSIS — R1319 Other dysphagia: Secondary | ICD-10-CM | POA: Insufficient documentation

## 2018-10-29 ENCOUNTER — Other Ambulatory Visit: Payer: Self-pay | Admitting: Internal Medicine

## 2018-10-31 ENCOUNTER — Telehealth: Payer: Self-pay

## 2018-10-31 NOTE — Telephone Encounter (Signed)
Mansouraty, Telford Nab., MD  Timothy Lasso, RN        Mikhael Hendriks,  Please reach out to the patient and patient's daughter to let them know about his results.  His liver tests remain completely normal.  His lipase is normal showing no evidence of recurrent pancreatitis.  His CA-19-9 is normal thus making an underlying biliary or pancreatic cancer very very unlikely (we already had this as a very low likelihood but helpful for [place] to know this lab wise as well).  His IgG4 level is slightly elevated not at 2-3 times the upper limit of normal to normal I think about an autoimmune cholangiopathy. I will discuss this with he and his daughter further after we proceed with our next ERCP with spyglass.  I will not start him on steroids currently.  Also let them know that I reached out to his surgeon to update her about his recent follow-up ERCPs and imaging.  Can you put a new problem as elevated IgG4 or elevated diagnostic laboratory marker?  Thanks.  GM

## 2018-10-31 NOTE — Telephone Encounter (Signed)
-----   Message from Timothy Lasso, RN sent at 09/19/2018  2:05 PM EDT ----- [name],  Please send letter when able.  EGD/ERCP in 6 to 8 weeks (I believe he has a clinic appointment and we should make sure that ERCP occurred after).  Please put on the information tab that we will need to do formal Barrett's biopsies as well as an ERCP with cholangioscopy/by. This should be a 2.5-hour time slot.  Please also increase PPI to twice daily (send refills for 3 months).  Thank you.  GM

## 2018-11-01 DIAGNOSIS — R899 Unspecified abnormal finding in specimens from other organs, systems and tissues: Secondary | ICD-10-CM | POA: Insufficient documentation

## 2018-11-01 NOTE — Telephone Encounter (Signed)
The patient has been notified of this information and all questions answered. New problem added to the pt chart.  He will keep the appt for ERCP as planned

## 2018-11-02 ENCOUNTER — Other Ambulatory Visit: Payer: Self-pay

## 2018-11-02 ENCOUNTER — Ambulatory Visit (INDEPENDENT_AMBULATORY_CARE_PROVIDER_SITE_OTHER): Payer: Medicare Other

## 2018-11-02 ENCOUNTER — Encounter: Payer: Self-pay | Admitting: Internal Medicine

## 2018-11-02 ENCOUNTER — Ambulatory Visit: Payer: Medicare Other | Admitting: Internal Medicine

## 2018-11-02 DIAGNOSIS — Z23 Encounter for immunization: Secondary | ICD-10-CM

## 2018-11-02 DIAGNOSIS — J90 Pleural effusion, not elsewhere classified: Secondary | ICD-10-CM

## 2018-11-02 DIAGNOSIS — R0600 Dyspnea, unspecified: Secondary | ICD-10-CM

## 2018-11-02 DIAGNOSIS — R0602 Shortness of breath: Secondary | ICD-10-CM

## 2018-11-02 DIAGNOSIS — R05 Cough: Secondary | ICD-10-CM | POA: Diagnosis not present

## 2018-11-02 DIAGNOSIS — R058 Other specified cough: Secondary | ICD-10-CM

## 2018-11-02 DIAGNOSIS — R0609 Other forms of dyspnea: Secondary | ICD-10-CM

## 2018-11-02 NOTE — Progress Notes (Signed)
Spoke with pt and notified of results per Dr. Wert. Pt verbalized understanding and denied any questions. 

## 2018-11-02 NOTE — Patient Instructions (Addendum)
Zyrtec is best choice for a night time antihistamine take as needed   Ok to take flonase 1-2 at bedtime and if not improving then you will to see ENT Janace Hoard)    Please remember to go to the  x-ray department  for your tests - we will call you with the results when they are available     Please schedule a follow up visit in 3 months but call sooner if needed

## 2018-11-02 NOTE — Assessment & Plan Note (Addendum)
Onset late 1990's  - Spirometry 10/22/09   FEV1 2.96 (86%)  With  fev1/fvc ratio 0.85 no graphics avail  - 07/27/2018   Walked RA  2 laps @  approx 248ft each @ avg pace  stopped due to  End of study, sob but not out of breath with sats still 94% -  D dimer 09/27/18 = 4.01 >>> perfusion lung scan 07/28/2018 > no evidence of pe - venous dopplers 07/29/18  done due to pos d dimer > neg bilaterally  - 09/07/2018   Walked RA  2 laps @  approx 250 ft each @ slow then fast pace  stopped due to  End of study, no sob and sats 94% at end of study   Improved and more limited by "weak legs" than sob at this point so will just monitor tis with repeat walking sats at ov in 3 months but rec he call sooner prn

## 2018-11-02 NOTE — Assessment & Plan Note (Signed)
Onset "as far back as he can remember with sense of nasal congestion and pnds, better p sinus surgery 2006 [place]  - Allergy profile  08/08/2018 >  Eos 0.3 /  IgE  258 RAST Pos dog > cat  - Sinus CT 09/14/2018 : Very mild mucosal thickening right maxillary sinus, anterior left ethmoid air cells and left frontal sinus. Status post partial ethmoidectomy and bilateral maxillary Antrostomy.  No evidence of a pulmonary problem here and has established ENT doc so rec  1) try flonase q hs with prn zyrtec  2) f/u with ENT Janace Hoard) if still not doing better with head "congestion"

## 2018-11-02 NOTE — Assessment & Plan Note (Signed)
First noted 06/23/18 Abd ct f/u from pancreatitis -  Perfusion lung scan 07/28/18  neg for pe/ venous dopplers nl - L chest u/s  08/04/2018  No free effusion to tap - f/u cxr 11/02/2018 no change very small/ loculated L effsuion   Most likely this represents permanent pleural parenchymal scarring from previous acute illness with pancreatitis vs HCAP but no assoc symptoms, no progression x 4 months so no need for further f/u unless directed by new symptoms   Discussed in detail all the  indications, usual  risks and alternatives  relative to the benefits with patient who agrees to proceed with conservative f/u as outlined

## 2018-11-02 NOTE — Progress Notes (Signed)
[name], male    DOB: January 12, 1941     MRN: [id number]   Brief patient profile:  39 yowm quit smoking 1978  cc " nasal congestion" all his life, took shots in his 58s but never really helped then sob  started around late 1990s (@ his  mid 95's) > cards eval > cabg no better even p cardiac rehab and gradually worse to point where [facility] = can't walk a nl pace on a flat grade s sob but does fine slow and flat so referred to pulmonary clinic 07/27/2018 by Dr   Jerilee Hoh      History of Present Illness  07/27/2018  Pulmonary/ 1st office eval/Adrinne Sze  Chief Complaint  Patient presents with  . Pulmonary Consult    Referred by Dr. Matthew Saras. Pt c/o SOB for the past year. He states he gets out of breath just bending over. He also gets SOB lying flat and when he lies on his right side.   Dyspnea: MMRC2 = can't walk a nl pace on a flat grade s sob but does fine slow and flat  Cough: better p sinus surgery  Sleep: wakes up every few hours s cough or gasping for breath, dx osa > can't tolerate cpap / wakes up feeling back pain and tired  SABA use: none  250 ft gentle hill to mb but hasn't done in a year and says he think he could in pm's but "not before lunch cause I'm hurting way too bad all over" rec Change pantoprazole to where you take it Take 30- 60 min before your first and last meals of the day  GERD diet  To get the most out of exercise, you need to be continuously aware that you are short of breath, but never out of breath, for 15- 30 minutes daily. As you improve, it will actually be easier for you to do the same amount of exercise  in  30 minutes so always push to the level where you are short of breath.      09/07/2018  f/u ov/Joli Koob re: L effusion p pancreatitis/ doe and uacs, did not bring meds/not following gerd diet with active dysphagia Chief Complaint  Patient presents with  . Follow-up    Patient reports that he still has sob with exertion. He reports that since his  last visit he has had a couple really bad days.   Dyspnea:  mb still makes sob and has to stop twice both ways  Cough: clear throat freq/ ?  better on protonix min mucus - same problem as long as he can remember  "head always stopped up but better p sinus surger around 2000 / using lots of mints / min actual mucus  Sleeping: cough worse at hs despite bed blocks and 4 in  rec GERD  Continue protonix 40 mg Take 30- 60 min before your first and last meals of the day and share your symptoms of difficulty swallowing with your GI doctor Please schedule a follow up office visit in 6 weeks, call sooner if needed with all medications /inhalers/ solutions    11/02/2018  f/u ov/Jyron Turman re:  L effusion / pancreatitis brought some but not all meds  Chief Complaint  Patient presents with  . Follow-up    Pt c/o increased congestion in his head- c/o HA and sinus pressure.    Dyspnea:  Improved mb and back s stopping if does it in am "before legs get tired"  Cough:  head and chest congestion/ "loose liquid"  Can't tell what color - rx flonase a couple of times a week / "antihistamine" helps  Worse day vs noct  Sleeping: bed blocks plus pillow around 20 degrees HOB does fine  SABA use: none  02: none    No obvious day to day or daytime variability or assoc excess/ purulent sputum or mucus plugs or hemoptysis or cp or chest tightness, subjective wheeze or overt  hb symptoms.   Sleeping as above  without nocturnal  or early am exacerbation  of respiratory  c/o's or need for noct saba. Also denies any obvious fluctuation of symptoms with weather or environmental changes or other aggravating or alleviating factors except as outlined above   No unusual exposure hx or h/o childhood pna/ asthma or knowledge of premature birth.  Current Allergies, Complete Past Medical History, Past Surgical History, Family History, and Social History were reviewed in [employer] record.  ROS  The following  are not active complaints unless bolded Hoarseness, sore throat, dysphagia, dental problems, itching, sneezing,  nasal congestion or discharge of excess mucus or purulent secretions, ear ache,   fever, chills, sweats, unintended wt loss or wt gain, classically pleuritic or exertional cp,  orthopnea pnd or arm/hand swelling  or leg swelling, presyncope, palpitations, abdominal pain, anorexia, nausea, vomiting, diarrhea  or change in bowel habits or change in bladder habits, change in stools or change in urine, dysuria, hematuria,  rash, arthralgias, visual complaints, headache, numbness, weakness or ataxia or problems with walking or coordination,  change in mood or  memory.        Current Meds  Medication Sig  . acetaminophen (TYLENOL) 325 MG tablet Take 650 mg by mouth every 6 (six) hours as needed for mild pain.  [name] amLODipine (NORVASC) 2.5 MG tablet Take 1 tablet (2.5 mg total) by mouth daily.  [name] aspirin EC 81 MG tablet Take 81 mg by mouth at bedtime.  [name] BYSTOLIC 10 MG tablet Take 10 mg by mouth daily.  . cyanocobalamin (,VITAMIN B-12,) 1000 MCG/ML injection INJECT 1 ML (CC) INTRAMUSCULARLY FOR 1 DOSE (Patient taking differently: Inject 1,000 mcg into the muscle every 30 (thirty) days. )  . dicyclomine (BENTYL) 10 MG capsule Take 1 capsule (10 mg total) by mouth every 8 (eight) hours as needed for spasms.  [name] ezetimibe (ZETIA) 10 MG tablet Take 1 tablet (10 mg total) by mouth daily.  . Fiber Complete TABS Take 1 tablet by mouth daily.  . folic acid (FOLVITE) A999333 MCG tablet Take 400 mcg by mouth daily.    . isosorbide mononitrate (IMDUR) 60 MG 24 hr tablet Take 1 tablet (60 mg total) by mouth daily.  . Menthol, Topical Analgesic, (BENGAY EX) Apply 1 application topically daily as needed (pain).  . Multiple Vitamins-Minerals (MULTIVITAMIN WITH MINERALS) tablet Take 1 tablet by mouth daily.  [name] NEEDLE, DISP, 25 G (B-D DISP NEEDLE 25GX1") 25G X 1" MISC Inject 1000 mcg into muscle once a month.  .  nitroGLYCERIN (NITROSTAT) 0.4 MG SL tablet Place 1 tablet (0.4 mg total) under the tongue every 5 (five) minutes as needed for chest pain.  [name] oxyCODONE-acetaminophen (PERCOCET) 10-325 MG tablet Take 1 tablet by mouth 2 (two) times daily.  . pantoprazole (PROTONIX) 40 MG tablet Take 30- 60 min before your first and last meals of the day (Patient taking differently: Take 40 mg by mouth daily. )  . Probiotic Product (PROBIOTIC ADVANCED PO) Take 1 tablet by mouth  2 (two) times daily.   [name] rOPINIRole (REQUIP) 2 MG tablet TAKE 1 TABLET BY MOUTH AT BEDTIME  . rosuvastatin (CRESTOR) 20 MG tablet Take 20 mg by mouth daily.  [name] venlafaxine (EFFEXOR) 75 MG tablet Take 1 tablet (75 mg total) by mouth daily.                    Objective:       11/02/2018      181  09/07/18 179 lb 9.6 oz (81.5 kg)  07/27/18 176 lb (79.8 kg)  05/19/18 180 lb (81.6 kg)     Vital signs reviewed - Note on arrival 02 sats  96% on RA       amb wm nad     HEENT : pt wearing mask not removed for exam due to covid -19 concerns.    NECK :  without JVD/Nodes/TM/ nl carotid upstrokes bilaterally   LUNGS: no acc muscle use,  Nl contour chest with minimal decreased bs with dullness L base without cough on insp or exp maneuvers   CV:  RRR  no s3 or murmur or increase in P2, and no edema   ABD:  soft and nontender with nl inspiratory excursion in the supine position. No bruits or organomegaly appreciated, bowel sounds nl  MS:  Nl gait/ ext warm without deformities, calf tenderness, cyanosis or clubbing No obvious joint restrictions   SKIN: warm and dry without lesions    NEURO:  alert, approp, nl sensorium with  no motor or cerebellar deficits apparent.       CXR PA and Lateral:   11/02/2018 :    I personally reviewed images and agree with radiology impression as follows:    Stable mild loculated left pleural effusion is noted with associated left basilar atelectasis or scarring.        Assessment

## 2018-11-24 ENCOUNTER — Other Ambulatory Visit (HOSPITAL_COMMUNITY)
Admission: RE | Admit: 2018-11-24 | Discharge: 2018-11-24 | Disposition: A | Discharge status: Home / Self Care | Payer: Medicare Other | Source: Ambulatory Visit | Attending: Gastroenterology | Admitting: Gastroenterology

## 2018-11-24 DIAGNOSIS — Z20828 Contact with and (suspected) exposure to other viral communicable diseases: Secondary | ICD-10-CM | POA: Insufficient documentation

## 2018-11-24 DIAGNOSIS — Z01812 Encounter for preprocedural laboratory examination: Secondary | ICD-10-CM | POA: Diagnosis not present

## 2018-11-25 ENCOUNTER — Encounter (HOSPITAL_COMMUNITY): Payer: Self-pay | Admitting: *Deleted

## 2018-11-26 LAB — NOVEL CORONAVIRUS, NAA (HOSP ORDER, SEND-OUT TO REF LAB; TAT 18-24 HRS): SARS-CoV-2, NAA: NOT DETECTED

## 2018-11-27 NOTE — Anesthesia Preprocedure Evaluation (Addendum)
Anesthesia Evaluation  Patient identified by MRN, date of birth, ID band Patient awake    Reviewed: Allergy & Precautions, NPO status , Patient's Chart, lab work & pertinent test results  Airway Mallampati: II  TM Distance: >3 FB Neck ROM: Full    Dental  (+) Edentulous Upper, Dental Advisory Given   Pulmonary former smoker,    breath sounds clear to auscultation       Cardiovascular hypertension, + CAD and + DOE   Rhythm:Regular Rate:Normal     Neuro/Psych    GI/Hepatic hiatal hernia, GERD  ,  Endo/Other  diabetesHypothyroidism   Renal/GU Renal disease     Musculoskeletal   Abdominal   Peds  Hematology   Anesthesia Other Findings   Reproductive/Obstetrics                           Anesthesia Physical Anesthesia Plan  ASA: IV  Anesthesia Plan: General   Post-op Pain Management:    Induction: Intravenous  PONV Risk Score and Plan:   Airway Management Planned: Mask and Simple Face Mask  Additional Equipment:   Intra-op Plan:   Post-operative Plan:   Informed Consent:   Plan Discussed with: Anesthesiologist  Anesthesia Plan Comments:         Anesthesia Quick Evaluation

## 2018-11-28 ENCOUNTER — Encounter (HOSPITAL_COMMUNITY): Payer: Self-pay | Admitting: *Deleted

## 2018-11-28 ENCOUNTER — Ambulatory Visit (HOSPITAL_COMMUNITY): Payer: Medicare Other | Admitting: Anesthesiology

## 2018-11-28 ENCOUNTER — Ambulatory Visit (HOSPITAL_COMMUNITY): Payer: Medicare Other

## 2018-11-28 ENCOUNTER — Ambulatory Visit (HOSPITAL_COMMUNITY)
Admission: RE | Admit: 2018-11-28 | Discharge: 2018-11-28 | Disposition: A | Discharge status: Home / Self Care | Payer: Medicare Other | Attending: Gastroenterology | Admitting: Gastroenterology

## 2018-11-28 ENCOUNTER — Encounter (HOSPITAL_COMMUNITY)
Admission: RE | Disposition: A | Discharge status: Home / Self Care | Payer: Self-pay | Source: Home / Self Care | Attending: Gastroenterology

## 2018-11-28 ENCOUNTER — Other Ambulatory Visit: Payer: Self-pay

## 2018-11-28 DIAGNOSIS — E538 Deficiency of other specified B group vitamins: Secondary | ICD-10-CM | POA: Insufficient documentation

## 2018-11-28 DIAGNOSIS — K2270 Barrett's esophagus without dysplasia: Secondary | ICD-10-CM

## 2018-11-28 DIAGNOSIS — K838 Other specified diseases of biliary tract: Secondary | ICD-10-CM | POA: Diagnosis not present

## 2018-11-28 DIAGNOSIS — Z7982 Long term (current) use of aspirin: Secondary | ICD-10-CM | POA: Insufficient documentation

## 2018-11-28 DIAGNOSIS — I2510 Atherosclerotic heart disease of native coronary artery without angina pectoris: Secondary | ICD-10-CM | POA: Diagnosis not present

## 2018-11-28 DIAGNOSIS — Z951 Presence of aortocoronary bypass graft: Secondary | ICD-10-CM | POA: Insufficient documentation

## 2018-11-28 DIAGNOSIS — I10 Essential (primary) hypertension: Secondary | ICD-10-CM | POA: Diagnosis not present

## 2018-11-28 DIAGNOSIS — Z9889 Other specified postprocedural states: Secondary | ICD-10-CM

## 2018-11-28 DIAGNOSIS — K8050 Calculus of bile duct without cholangitis or cholecystitis without obstruction: Secondary | ICD-10-CM | POA: Diagnosis not present

## 2018-11-28 DIAGNOSIS — D649 Anemia, unspecified: Secondary | ICD-10-CM | POA: Diagnosis not present

## 2018-11-28 DIAGNOSIS — E785 Hyperlipidemia, unspecified: Secondary | ICD-10-CM | POA: Diagnosis not present

## 2018-11-28 DIAGNOSIS — Z9049 Acquired absence of other specified parts of digestive tract: Secondary | ICD-10-CM | POA: Insufficient documentation

## 2018-11-28 DIAGNOSIS — G4730 Sleep apnea, unspecified: Secondary | ICD-10-CM | POA: Diagnosis not present

## 2018-11-28 DIAGNOSIS — K831 Obstruction of bile duct: Secondary | ICD-10-CM | POA: Diagnosis not present

## 2018-11-28 DIAGNOSIS — F329 Major depressive disorder, single episode, unspecified: Secondary | ICD-10-CM | POA: Insufficient documentation

## 2018-11-28 DIAGNOSIS — Z87891 Personal history of nicotine dependence: Secondary | ICD-10-CM | POA: Insufficient documentation

## 2018-11-28 DIAGNOSIS — Z4589 Encounter for adjustment and management of other implanted devices: Secondary | ICD-10-CM | POA: Diagnosis not present

## 2018-11-28 DIAGNOSIS — Z8719 Personal history of other diseases of the digestive system: Secondary | ICD-10-CM | POA: Insufficient documentation

## 2018-11-28 DIAGNOSIS — G2581 Restless legs syndrome: Secondary | ICD-10-CM | POA: Diagnosis not present

## 2018-11-28 DIAGNOSIS — Z79899 Other long term (current) drug therapy: Secondary | ICD-10-CM | POA: Diagnosis not present

## 2018-11-28 DIAGNOSIS — Z8551 Personal history of malignant neoplasm of bladder: Secondary | ICD-10-CM | POA: Diagnosis not present

## 2018-11-28 DIAGNOSIS — Z4659 Encounter for fitting and adjustment of other gastrointestinal appliance and device: Secondary | ICD-10-CM

## 2018-11-28 DIAGNOSIS — J449 Chronic obstructive pulmonary disease, unspecified: Secondary | ICD-10-CM | POA: Diagnosis not present

## 2018-11-28 DIAGNOSIS — K228 Other specified diseases of esophagus: Secondary | ICD-10-CM | POA: Diagnosis not present

## 2018-11-28 DIAGNOSIS — K8051 Calculus of bile duct without cholangitis or cholecystitis with obstruction: Secondary | ICD-10-CM | POA: Diagnosis not present

## 2018-11-28 DIAGNOSIS — R8569 Abnormal cytological findings in specimens from other digestive organs and abdominal cavity: Secondary | ICD-10-CM | POA: Diagnosis not present

## 2018-11-28 DIAGNOSIS — K219 Gastro-esophageal reflux disease without esophagitis: Secondary | ICD-10-CM | POA: Diagnosis not present

## 2018-11-28 DIAGNOSIS — K839 Disease of biliary tract, unspecified: Secondary | ICD-10-CM | POA: Diagnosis not present

## 2018-11-28 HISTORY — PX: BILIARY STENT PLACEMENT: SHX5538

## 2018-11-28 HISTORY — PX: ESOPHAGOGASTRODUODENOSCOPY (EGD) WITH PROPOFOL: SHX5813

## 2018-11-28 HISTORY — PX: STENT REMOVAL: SHX6421

## 2018-11-28 HISTORY — DX: Personal history of other diseases of the digestive system: Z87.19

## 2018-11-28 HISTORY — PX: ERCP: SHX5425

## 2018-11-28 HISTORY — PX: SAVORY DILATION: SHX5439

## 2018-11-28 HISTORY — PX: BIOPSY: SHX5522

## 2018-11-28 HISTORY — PX: REMOVAL OF STONES: SHX5545

## 2018-11-28 HISTORY — PX: SPYGLASS CHOLANGIOSCOPY: SHX5441

## 2018-11-28 HISTORY — PX: BILIARY DILATION: SHX6850

## 2018-11-28 HISTORY — PX: BILIARY BRUSHING: SHX6843

## 2018-11-28 SURGERY — ERCP, WITH INTERVENTION IF INDICATED
Anesthesia: General

## 2018-11-28 MED ORDER — PROPOFOL 10 MG/ML IV BOLUS
INTRAVENOUS | Status: DC | PRN
  Administered 2018-11-28: 160 mg via INTRAVENOUS

## 2018-11-28 MED ORDER — FENTANYL CITRATE (PF) 100 MCG/2ML IJ SOLN: 25.0000 ug | INTRAMUSCULAR | Status: DC | PRN

## 2018-11-28 MED ORDER — OXYCODONE HCL 5 MG/5ML PO SOLN: 5.0000 mg | Freq: Once | ORAL | Status: DC | PRN

## 2018-11-28 MED ORDER — SUGAMMADEX SODIUM 200 MG/2ML IV SOLN
INTRAVENOUS | Status: DC | PRN
  Administered 2018-11-28: 200 mg via INTRAVENOUS

## 2018-11-28 MED ORDER — LIDOCAINE 2% (20 MG/ML) 5 ML SYRINGE
INTRAMUSCULAR | Status: DC | PRN
  Administered 2018-11-28: 80 mg via INTRAVENOUS

## 2018-11-28 MED ORDER — INDOMETHACIN 50 MG RE SUPP
RECTAL | Status: AC
  Filled 2018-11-28: qty 2

## 2018-11-28 MED ORDER — EPHEDRINE SULFATE-NACL 50-0.9 MG/10ML-% IV SOSY
PREFILLED_SYRINGE | INTRAVENOUS | Status: DC | PRN
  Administered 2018-11-28: 5 mg via INTRAVENOUS
  Administered 2018-11-28 (×2): 10 mg via INTRAVENOUS

## 2018-11-28 MED ORDER — PHENYLEPHRINE 40 MCG/ML (10ML) SYRINGE FOR IV PUSH (FOR BLOOD PRESSURE SUPPORT)
PREFILLED_SYRINGE | INTRAVENOUS | Status: DC | PRN
  Administered 2018-11-28: 120 ug via INTRAVENOUS
  Administered 2018-11-28: 80 ug via INTRAVENOUS
  Administered 2018-11-28: 120 ug via INTRAVENOUS
  Administered 2018-11-28: 80 ug via INTRAVENOUS

## 2018-11-28 MED ORDER — SODIUM CHLORIDE 0.9 % IV SOLN
INTRAVENOUS | Status: DC | PRN
  Administered 2018-11-28: 70 mL

## 2018-11-28 MED ORDER — FENTANYL CITRATE (PF) 250 MCG/5ML IJ SOLN
INTRAMUSCULAR | Status: DC | PRN
  Administered 2018-11-28 (×2): 50 ug via INTRAVENOUS

## 2018-11-28 MED ORDER — INDOMETHACIN 50 MG RE SUPP
RECTAL | Status: DC | PRN
  Administered 2018-11-28: 100 mg via RECTAL

## 2018-11-28 MED ORDER — SODIUM CHLORIDE 0.9 % IV SOLN: INTRAVENOUS | Status: DC

## 2018-11-28 MED ORDER — PHENYLEPHRINE HCL-NACL 10-0.9 MG/250ML-% IV SOLN
INTRAVENOUS | Status: DC | PRN
  Administered 2018-11-28: 30 ug/min via INTRAVENOUS

## 2018-11-28 MED ORDER — ACETAMINOPHEN 160 MG/5ML PO SOLN: 325.0000 mg | ORAL | Status: DC | PRN

## 2018-11-28 MED ORDER — SODIUM CHLORIDE 0.9 % IV SOLN
1.5000 g | Freq: Once | INTRAVENOUS | Status: AC
  Administered 2018-11-28: 1.5 g via INTRAVENOUS
  Filled 2018-11-28 (×2): qty 4

## 2018-11-28 MED ORDER — ROCURONIUM BROMIDE 10 MG/ML (PF) SYRINGE
PREFILLED_SYRINGE | INTRAVENOUS | Status: DC | PRN
  Administered 2018-11-28: 20 mg via INTRAVENOUS
  Administered 2018-11-28: 80 mg via INTRAVENOUS

## 2018-11-28 MED ORDER — CIPROFLOXACIN HCL 500 MG PO TABS: 500.0000 mg | ORAL_TABLET | Freq: Two times a day (BID) | ORAL | 0 refills | Status: AC

## 2018-11-28 MED ORDER — ACETAMINOPHEN 325 MG PO TABS: 325.0000 mg | ORAL_TABLET | ORAL | Status: DC | PRN

## 2018-11-28 MED ORDER — MEPERIDINE HCL 100 MG/ML IJ SOLN: 6.2500 mg | INTRAMUSCULAR | Status: DC | PRN

## 2018-11-28 MED ORDER — LACTATED RINGERS IV SOLN
INTRAVENOUS | Status: DC | PRN
  Administered 2018-11-28 (×2): via INTRAVENOUS

## 2018-11-28 MED ORDER — OXYCODONE HCL 5 MG PO TABS: 5.0000 mg | ORAL_TABLET | Freq: Once | ORAL | Status: DC | PRN

## 2018-11-28 MED ORDER — GLUCAGON HCL RDNA (DIAGNOSTIC) 1 MG IJ SOLR
INTRAMUSCULAR | Status: AC
  Filled 2018-11-28: qty 2

## 2018-11-28 MED ORDER — ONDANSETRON HCL 4 MG/2ML IJ SOLN: 4.0000 mg | Freq: Once | INTRAMUSCULAR | Status: DC | PRN

## 2018-11-28 MED ORDER — GLUCAGON HCL RDNA (DIAGNOSTIC) 1 MG IJ SOLR
INTRAMUSCULAR | Status: DC | PRN
  Administered 2018-11-28 (×3): 0.25 mg via INTRAVENOUS

## 2018-11-28 MED ORDER — ONDANSETRON HCL 4 MG/2ML IJ SOLN
INTRAMUSCULAR | Status: DC | PRN
  Administered 2018-11-28: 4 mg via INTRAVENOUS

## 2018-11-28 NOTE — Transfer of Care (Signed)
Immediate Anesthesia Transfer of Care Note  Patient: [name]  Procedure(s) Performed: ENDOSCOPIC RETROGRADE CHOLANGIOPANCREATOGRAPHY (ERCP) +EGD with spyglass (N/A ) SPYGLASS CHOLANGIOSCOPY (N/A ) SAVORY DILATION (N/A ) ESOPHAGOGASTRODUODENOSCOPY (EGD) WITH PROPOFOL (N/A ) STENT REMOVAL BILIARY DILATION BIOPSY BILIARY BRUSHING REMOVAL OF STONES BILIARY STENT PLACEMENT  Patient Location: PACU  Anesthesia Type:General  Level of Consciousness: awake and alert   Airway & Oxygen Therapy: Patient Spontanous Breathing and Patient connected to nasal cannula oxygen  Post-op Assessment: Report given to RN, Post -op Vital signs reviewed and stable and Patient moving all extremities X 4  Post vital signs: Reviewed and stable  Last Vitals:  Vitals Value Taken Time  BP 154/68 11/28/18 1017  Temp    Pulse 59 11/28/18 1018  Resp    SpO2 100 % 11/28/18 1018  Vitals shown include unvalidated device data.  Last Pain:  Vitals:   11/28/18 0708  TempSrc: Oral  PainSc: 5       Patients Stated Pain Goal: 5 (0000000 [id number])  Complications: No apparent anesthesia complications

## 2018-11-28 NOTE — Anesthesia Postprocedure Evaluation (Signed)
Anesthesia Post Note  Patient: [name]  Procedure(s) Performed: ENDOSCOPIC RETROGRADE CHOLANGIOPANCREATOGRAPHY (ERCP) +EGD with spyglass (N/A ) SPYGLASS CHOLANGIOSCOPY (N/A ) SAVORY DILATION (N/A ) ESOPHAGOGASTRODUODENOSCOPY (EGD) WITH PROPOFOL (N/A ) STENT REMOVAL BILIARY DILATION BIOPSY BILIARY BRUSHING REMOVAL OF STONES BILIARY STENT PLACEMENT     Patient location during evaluation: Endoscopy Anesthesia Type: General Level of consciousness: awake Pain management: pain level controlled Vital Signs Assessment: post-procedure vital signs reviewed and stable Respiratory status: spontaneous breathing Cardiovascular status: stable Postop Assessment: no apparent nausea or vomiting Anesthetic complications: no    Last Vitals:  Vitals:   11/28/18 1035 11/28/18 1050  BP: (!) 149/77 (!) 146/69  Pulse: (!) 58 (!) 59  Resp: 16 13  Temp:  (!) 36.2 C  SpO2: 100% 95%    Last Pain:  Vitals:   11/28/18 1050  TempSrc:   PainSc: 0-No pain                 Miciah Shealy

## 2018-11-28 NOTE — H&P (Signed)
GASTROENTEROLOGY PROCEDURE H&P NOTE   Primary Care Physician: Isaac Bliss, Rayford Halsted, MD  HPI: [name] is a 78 y.o. male who presents for ERCP.  Past Medical History:  Diagnosis Date  . Allergy   . B12 deficiency anemia   . Blood transfusion without reported diagnosis   . CAD (coronary artery disease)   . Cancer ([place])    bladder-   . Colon polyps   . COPD (chronic obstructive pulmonary disease) ([place])   . Depression   . Esophagus, Barrett's   . GERD (gastroesophageal reflux disease)   . History of bladder cancer    Bladder cancer "8 times"  . History of hiatal hernia   . Hyperlipidemia   . Hypertension   . Localized osteoarthrosis, lower leg   . Restless leg syndrome   . Sleep apnea    does not wear cpap  . Stenosis of esophagus    Past Surgical History:  Procedure Laterality Date  . BILIARY BRUSHING  04/01/2018   Procedure: BILIARY BRUSHING;  Surgeon: Rush Landmark Telford Nab., MD;  Location: [place];  Service: Gastroenterology;;  . BILIARY BRUSHING  09/12/2018   Procedure: BILIARY BRUSHING;  Surgeon: Irving Copas., MD;  Location: [place];  Service: Gastroenterology;;  . BILIARY DILATION  09/12/2018   Procedure: BILIARY DILATION;  Surgeon: Irving Copas., MD;  Location: [place];  Service: Gastroenterology;;  . BILIARY STENT PLACEMENT  04/01/2018   Procedure: BILIARY STENT PLACEMENT;  Surgeon: Irving Copas., MD;  Location: [place];  Service: Gastroenterology;;  . BILIARY STENT PLACEMENT  09/12/2018   Procedure: BILIARY STENT PLACEMENT;  Surgeon: Irving Copas., MD;  Location: [place];  Service: Gastroenterology;;  . BIOPSY  04/01/2018   Procedure: BIOPSY;  Surgeon: Irving Copas., MD;  Location: [place];  Service: Gastroenterology;;  . BIOPSY  09/12/2018   Procedure: BIOPSY;  Surgeon: Irving Copas., MD;  Location: [place];  Service: Gastroenterology;;  . bladder cancer       x 8 cystoscopy  . CERVICAL DISCECTOMY     ACDF  . COLONOSCOPY  11/17/2005   normal   . CORONARY ARTERY BYPASS GRAFT     x4  . ENDOSCOPIC MUCOSAL RESECTION  09/12/2018   Procedure: ENDOSCOPIC MUCOSAL RESECTION;  Surgeon: Rush Landmark Telford Nab., MD;  Location: [facility] ENDOSCOPY;  Service: Gastroenterology;;  . ENDOSCOPIC RETROGRADE CHOLANGIOPANCREATOGRAPHY (ERCP) WITH PROPOFOL N/A 04/01/2018   Procedure: ENDOSCOPIC RETROGRADE CHOLANGIOPANCREATOGRAPHY (ERCP) WITH PROPOFOL;  Surgeon: Irving Copas., MD;  Location: [place];  Service: Gastroenterology;  Laterality: N/A;  . ENDOSCOPIC RETROGRADE CHOLANGIOPANCREATOGRAPHY (ERCP) WITH PROPOFOL N/A 09/12/2018   Procedure: ENDOSCOPIC RETROGRADE CHOLANGIOPANCREATOGRAPHY (ERCP) WITH PROPOFOL;  Surgeon: Rush Landmark Telford Nab., MD;  Location: [place];  Service: Gastroenterology;  Laterality: N/A;  . ESOPHAGOGASTRODUODENOSCOPY  04/29/2010  . ESOPHAGOGASTRODUODENOSCOPY (EGD) WITH PROPOFOL N/A 04/01/2018   Procedure: ESOPHAGOGASTRODUODENOSCOPY (EGD) WITH PROPOFOL;  Surgeon: Rush Landmark Telford Nab., MD;  Location: [place];  Service: Gastroenterology;  Laterality: N/A;  . ESOPHAGOGASTRODUODENOSCOPY (EGD) WITH PROPOFOL N/A 09/12/2018   Procedure: ESOPHAGOGASTRODUODENOSCOPY (EGD) WITH PROPOFOL;  Surgeon: Rush Landmark Telford Nab., MD;  Location: [place];  Service: Gastroenterology;  Laterality: N/A;  . EUS  04/01/2018   Procedure: FULL UPPER ENDOSCOPIC ULTRASOUND (EUS) RADIAL;  Surgeon: Irving Copas., MD;  Location: [place];  Service: Gastroenterology;;  . EUS N/A 09/12/2018   Procedure: UPPER ENDOSCOPIC ULTRASOUND (EUS) RADIAL;  Surgeon: Irving Copas., MD;  Location: [place];  Service: Gastroenterology;  Laterality: N/A;  . FINE NEEDLE  ASPIRATION  09/12/2018   Procedure: FINE NEEDLE ASPIRATION (FNA) LINEAR;  Surgeon: Irving Copas., MD;  Location: [place];  Service: Gastroenterology;;  . HEMOSTASIS  CLIP PLACEMENT  09/12/2018   Procedure: HEMOSTASIS CLIP PLACEMENT;  Surgeon: Irving Copas., MD;  Location: [facility] ENDOSCOPY;  Service: Gastroenterology;;  . I&D EXTREMITY Left 11/24/2016   Procedure: IRRIGATION AND DEBRIDEMENT LEFT HAND, THUMB, INDEX, MIDDLE, RING, AND SMALL FINGERS WITH RECONSTRUCTION;  Surgeon: Roseanne Kaufman, MD;  Location: [place];  Service: Orthopedics;  Laterality: Left;  . KNEE ARTHROSCOPY Left   . LUMBAR LAMINECTOMY     and fusion x 2  . NASAL SINUS SURGERY    . POPLITEAL SYNOVIAL CYST EXCISION    . REMOVAL OF STONES  04/01/2018   Procedure: REMOVAL OF STONES;  Surgeon: Rush Landmark Telford Nab., MD;  Location: [place];  Service: Gastroenterology;;  . REMOVAL OF STONES  09/12/2018   Procedure: REMOVAL OF STONES;  Surgeon: Irving Copas., MD;  Location: [place];  Service: Gastroenterology;;  . Azzie Almas DILATION N/A 09/12/2018   Procedure: Azzie Almas DILATION;  Surgeon: Irving Copas., MD;  Location: [place];  Service: Gastroenterology;  Laterality: N/A;  . SPHINCTEROTOMY  04/01/2018   Procedure: SPHINCTEROTOMY;  Surgeon: Rush Landmark Telford Nab., MD;  Location: [place];  Service: Gastroenterology;;  . Lavell Islam REMOVAL  09/12/2018   Procedure: STENT REMOVAL;  Surgeon: Irving Copas., MD;  Location: [place];  Service: Gastroenterology;;  . Lia Foyer LIFTING INJECTION  09/12/2018   Procedure: SUBMUCOSAL LIFTING INJECTION;  Surgeon: Irving Copas., MD;  Location: [place];  Service: Gastroenterology;;   Current Facility-Administered Medications  Medication Dose Route Frequency Provider Last Rate Last Dose  . 0.9 %  sodium chloride infusion   Intravenous Continuous Mansouraty, Telford Nab., MD      . ampicillin-sulbactam (UNASYN) 1.5 g in sodium chloride 0.9 % 100 mL IVPB  1.5 g Intravenous Once Mansouraty, Telford Nab., MD       Allergies  Allergen Reactions  . Losartan Potassium Other (See Comments)    Hyperkalemia   . Tape Itching and Rash    reddened skin    Family History  Problem Relation Age of Onset  . Melanoma Mother   . Stroke Father   . Hypertension Father   . Coronary artery disease Other   . Colon cancer Neg Hx   . Esophageal cancer Neg Hx   . Stomach cancer Neg Hx   . Rectal cancer Neg Hx   . Pancreatic cancer Neg Hx   . Liver disease Neg Hx   . Inflammatory bowel disease Neg Hx    Social History   Socioeconomic History  . Marital status: Married    Spouse name: Not on file  . Number of children: Not on file  . Years of education: Not on file  . Highest education level: Not on file  Occupational History  . Not on file  Social Needs  . Financial resource strain: Not on file  . Food insecurity    Worry: Not on file    Inability: Not on file  . Transportation needs    Medical: Not on file    Non-medical: Not on file  Tobacco Use  . Smoking status: Former Smoker    Packs/day: 0.50    Years: 5.00    Pack years: 2.50    Types: Cigarettes    Quit date: 03/30/1976    Years since quitting: 42.6  . Smokeless tobacco: Never Used  Substance and Sexual  Activity  . Alcohol use: No    Alcohol/week: 0.0 standard drinks  . Drug use: No  . Sexual activity: Yes  Lifestyle  . Physical activity    Days per week: Not on file    Minutes per session: Not on file  . Stress: Not on file  Relationships  . Social Herbalist on phone: Not on file    Gets together: Not on file    Attends religious service: Not on file    Active member of club or organization: Not on file    Attends meetings of clubs or organizations: Not on file    Relationship status: Not on file  . Intimate partner violence    Fear of current or ex partner: Not on file    Emotionally abused: Not on file    Physically abused: Not on file    Forced sexual activity: Not on file  Other Topics Concern  . Not on file  Social History Narrative  . Not on file    Physical Exam: Vital signs in last 24  hours: Temp:  [98.9 F (37.2 C)] 98.9 F (37.2 C) (11/09 0708) Resp:  [18] 18 (11/09 0708) BP: (155)/(71) 155/71 (11/09 0708) SpO2:  [97 %] 97 % (11/09 0708) Weight:  [82.1 kg] 82.1 kg (11/09 0708)   GEN: NAD EYE: Sclerae anicteric ENT: MMM CV: Non-tachycardic GI: Soft, NT/ND NEURO:  Alert & Oriented x 3  Lab Results: No results for input(s): WBC, HGB, HCT, PLT in the last 72 hours. BMET No results for input(s): NA, K, CL, CO2, GLUCOSE, BUN, CREATININE, CALCIUM in the last 72 hours. LFT No results for input(s): PROT, ALBUMIN, AST, ALT, ALKPHOS, BILITOT, BILIDIR, IBILI in the last 72 hours. PT/INR No results for input(s): LABPROT, INR in the last 72 hours.   Impression / Plan: This is a 78 y.o.male who presents for ERCP.   The risks of an ERCP were discussed at length, including but not limited to the risk of perforation, bleeding, abdominal pain, post-ERCP pancreatitis (while usually mild can be severe and even life threatening).  The risks and benefits of endoscopic evaluation were discussed with the patient; these include but are not limited to the risk of perforation, infection, bleeding, missed lesions, lack of diagnosis, severe illness requiring hospitalization, as well as anesthesia and sedation related illnesses.  The patient is agreeable to proceed.    Justice Britain, MD [facility] Office # [phone]

## 2018-11-28 NOTE — Op Note (Addendum)
[facility] Patient Name: [name] Procedure Date : 11/28/2018 MRN: [id number] Attending MD: Justice Britain , MD Date of Birth: 1941/01/10 CSN: [id number] Age: 78 Admit Type: Inpatient Procedure:                ERCP Indications:              Common bile duct stricture, Biliary dilation on                            Computed Tomogram Scan, Stent change, History of                            Gallstone Pancreatitis s/p ERCP with stone                            extraction with previous Atypical biliary brushings                            holding a cholecystectomy now all biopsies and                            brushings negative for last ERCP Providers:                Justice Britain, MD, Carlyn Reichert, RN, Ladona Ridgel, Technician Referring MD:             Rayford Halsted. Isaac Bliss, Dr. Kae Heller, Dr. Ninfa Linden Medicines:                General Anesthesia, Unasyn 1.5 g IV, Indomethacin                            100 mg PR, Glucagon 6.28 mg IV Complications:            No immediate complications. Estimated Blood Loss:     Estimated blood loss was minimal. Procedure:                Pre-Anesthesia Assessment:                           - Prior to the procedure, a History and Physical                            was performed, and patient medications and                            allergies were reviewed. The patient's tolerance of                            previous anesthesia was also reviewed. The risks                            and benefits of the procedure and the sedation  options and risks were discussed with the patient.                            All questions were answered, and informed consent                            was obtained. Prior Anticoagulants: The patient has                            taken no previous anticoagulant or antiplatelet                            agents except for aspirin. ASA  Grade Assessment:                            III - A patient with severe systemic disease. After                            reviewing the risks and benefits, the patient was                            deemed in satisfactory condition to undergo the                            procedure.                           After obtaining informed consent, the scope was                            passed under direct vision. Throughout the                            procedure, the patient's blood pressure, pulse, and                            oxygen saturations were monitored continuously. The                            GIF-H190 ([id number]) Olympus gastroscope was                            introduced through the mouth, and used to inject                            contrast into and used to locate the major papilla.                            The TJF-Q180V ([id number]) Olympus duodenoscope was                            introduced through the mouth, and used to inject  contrast into and used for direct visualization of                            the bile duct. The ERCP was accomplished without                            difficulty. The patient tolerated the procedure. Scope In: Scope Out: Findings:      Two biliary stents were visible on the scout film.      A standard esophagogastroduodenoscopy scope was used for the examination       of the upper gastrointestinal tract. The scope was passed under direct       vision through the upper GI tract. No gross mucosal lesions were noted       in the proximal esophagus and in the mid esophagus. Scattered islands of       salmon-colored mucosa were present from 40 to 41 cm. No other visible       abnormalities were present. The maximum longitudinal extent of these       esophageal mucosal changes was 1 cm in length. The Z-line was irregular       and was found 41 cm from the incisors. Biopsies were taken in the distal       esophagus  through the esophagogastroduodenoscope with the cold forceps       for histology from the scattered islands. A guidewire was placed and the       scope was withdrawn. Dilation was performed in the entire esophagus with       a Savary dilator with no resistance at 16 mm and 17 mm and mild       resistance at 18 mm and the scope was reinserted without evidence of       significant mucosal changes.No gross lesions were noted in the entire       examined stomach. No gross lesions were noted in the duodenal bulb, in       the first portion of the duodenum and in the second portion of the       duodenum. A biliary sphincterotomy had been performed. The       sphincterotomy appeared open. Two plastic biliary stents originating in       the biliary tree were emerging from the major papilla. The stents were       visibly patent. Two stents were removed from the biliary tree using a       snare and sent for cytology.      A short 0.035 inch Soft Jagwire was passed into the biliary tree. The       Autotome sphincterotome was passed over the guidewire and the bile duct       was then deeply cannulated. Contrast was injected. I personally       interpreted the bile duct images. Ductal flow of contrast was adequate.       Image quality was adequate. Contrast extended to the hepatic ducts.       Opacification of the entire biliary tree except for the gallbladder was       successful. The maximum diameter of the ducts was 16 mm. The main bile       duct contained filling defects thought to be sludge/debris (new from       previous ERCP which had completely cleared the duct). The  lower third of       the main bile duct contained a single localized stenosis <10 mm in       length. Dilation of the common bile duct narrowing with an 08-27-08 mm x       5.5 cm CRE balloon (to a maximum balloon size of 10 mm) dilator was       successful. The biliary tree was swept with a retrieval balloon starting       at the  bifurcation. Sludge was swept from the duct. A few stones were       removed. No stones remained. Cells for cytology were obtained by       brushing in the lower third of the main bile duct narrowing/stenosis.      The bile duct was then explored endoscopically using the SpyGlass direct       visualization system - it did require the placement of the probe in most       instances in the long-position due to the angulation at the D1/D2 angle.       The SpyGlass probe was advanced to the bifurcation. Visibility with the       probe was good. The lower third of the main bile duct contained a       localized irregularity with moderate narrowing. There was evidence of       some abnormal appearing tissue though had more of a visual look of       inflammatory changes rather than typical malignant/premalignant changes.       The lower third of the main bile duct was biopsied with a SpyBite       miniature biopsy forceps for histology. The SpyGlass probe was removed.      A wire was replaced and then the biliary tree was swept with a retrieval       balloon starting at the bifurcation. Nothing was found. An occlusion       cholangiogram was performed that showed no further significant biliary       pathology. Three plastic biliary stents with a single external flap and       a single internal flap were placed into the common bile duct (Two 10 Fr       by 5 cm & One 8.5 Fr by 5 cm). Bile flowed through the stents. The       stents were in good position.      A pancreatogram was not performed.      The duodenoscope was withdrawn from the patient. Impression:               - Salmon-colored mucosal islands suspicious for                            short-segment Barrett's esophagus - biopsied.                           - Z-line irregular, 41 cm from the incisors.                           - Dilation performed in the entire esophagus.                           - No gross lesions in the stomach.                            -  No gross lesions in the duodenal bulb, in the                            first portion of the duodenum and in the second                            portion of the duodenum.                           - Prior biliary sphincterotomy appeared open.                           - Two visibly patent stents from the biliary tree                            were seen in the major papilla. These were removed                            and sent for cytology.                           - The fluoroscopic examination was suspicious for                            sludge/debris.                           - A single localized biliary stricture was found in                            the lower third of the main bile duct. The                            stricture was indeterminate. This was dilated. This                            was brushed for cytology.                           - Choledocholithiasis and biliary sludge was found.                            Complete removal was accomplished by sweeping on                            multiple occasions. Occlusion cholangiogram showed                            a clear duct at end of procedure.                           - Moderately narrowed lumen in the biliary tract  visualized via SpyGlass. This was biopsied.                           - Three plastic biliary stents were placed into the                            common bile duct to aid in dilation of the                            narrowing.                           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 The                            consideration of an IgG4 mediated process remains.                            However, there is no doubt that the patient                            continues to drop stones/sludge from the                            gallbladder into the duct as evidenced on  last 2                            ERCPs of significant stone/debris being removed. Recommendation:           - The patient will be observed post-procedure,                            until all discharge criteria are met.                           - Discharge patient to home.                           - Patient has a contact number available for                            emergencies. The signs and symptoms of potential                            delayed complications were discussed with the                            patient. Return to normal activities tomorrow.                            Written discharge instructions were provided to the  patient.                           - Observe patient's clinical course.                           - Await cytology results and await path results.                           - Ciprofloxacin 500 mg BID x 5-days (Rx sent to                            pharmacy) to decrease risk of post-infectious ERCP                            complications post-SpyGlass.                           - Check liver enzymes (AST, ALT, alkaline                            phosphatase, bilirubin) in 2 weeks.                           - Watch for pancreatitis, bleeding, perforation,                            and cholangitis.                           - Patient should be re-evaluated by Surgery to                            discuss timing of cholecystectomy - if the                            pathology is unremarkable, then I suspect, that                            this is an inflammatory driven stricture. He did                            have a positive IgG4 elevation, so consideration of                            potential role of steroids can be considered. But                            there is no doubt that he is still having                            significant sludge/stone debris develop and exude                            from his  gallbladder.                           -  Return to GI clinic in 1 month.                           - The findings and recommendations were discussed                            with the patient.                           - The findings and recommendations were discussed                            with the patient's family. Procedure Code(s):        --- Professional ---                           [phone], Endoscopic retrograde                            cholangiopancreatography (ERCP); with removal and                            exchange of stent(s), biliary or pancreatic duct,                            including pre- and post-dilation and guide wire                            passage, when performed, including sphincterotomy,                            when performed, each stent exchanged                           43276, 23, Endoscopic retrograde                            cholangiopancreatography (ERCP); with removal and                            exchange of stent(s), biliary or pancreatic duct,                            including pre- and post-dilation and guide wire                            passage, when performed, including sphincterotomy,                            when performed, each stent exchanged                           43276, 59, Endoscopic retrograde                            cholangiopancreatography (ERCP); with removal and  exchange of stent(s), biliary or pancreatic duct,                            including pre- and post-dilation and guide wire                            passage, when performed, including sphincterotomy,                            when performed, each stent exchanged                           43264, Endoscopic retrograde                            cholangiopancreatography (ERCP); with removal of                            calculi/debris from biliary/pancreatic duct(s)                           43248,  Esophagogastroduodenoscopy, flexible,                            transoral; with insertion of guide wire followed by                            passage of dilator(s) through esophagus over guide                            wire                           43273, Endoscopic cannulation of papilla with                            direct visualization of pancreatic/common bile                            duct(s) (List separately in addition to code(s) for                            primary procedure) Diagnosis Code(s):        --- Professional ---                           K22.8, Other specified diseases of esophagus                           K80.51, Calculus of bile duct without cholangitis                            or cholecystitis with obstruction                           Z96.89, Presence of other specified functional  implants                           R93.2, Abnormal findings on diagnostic imaging of                            liver and biliary tract                           Z46.59, Encounter for fitting and adjustment of                            other gastrointestinal appliance and device                           K83.8, Other specified diseases of biliary tract CPT copyright 2019 [employer]. All rights reserved. The codes documented in this report are preliminary and upon coder review may  be revised to meet current compliance requirements. Justice Britain, MD 11/28/2018 10:21:19 AM Number of Addenda: 0

## 2018-11-28 NOTE — Anesthesia Procedure Notes (Signed)
Procedure Name: Intubation Date/Time: 11/28/2018 7:57 AM Performed by: Harden Mo, CRNA Pre-anesthesia Checklist: Patient identified, Emergency Drugs available, Suction available and Patient being monitored Patient Re-evaluated:Patient Re-evaluated prior to induction Oxygen Delivery Method: Circle System Utilized Preoxygenation: Pre-oxygenation with 100% oxygen Induction Type: IV induction Ventilation: Mask ventilation without difficulty and Oral airway inserted - appropriate to patient size Laryngoscope Size: Sabra Heck and 2 Grade View: Grade I Tube type: Oral Tube size: 7.0 mm Number of attempts: 1 Airway Equipment and Method: Stylet and Oral airway Placement Confirmation: ETT inserted through vocal cords under direct vision,  positive ETCO2 and breath sounds checked- equal and bilateral Secured at: 22 cm Tube secured with: Tape Dental Injury: Teeth and Oropharynx as per pre-operative assessment

## 2018-11-29 ENCOUNTER — Encounter (HOSPITAL_COMMUNITY): Payer: Self-pay | Admitting: Gastroenterology

## 2018-11-29 ENCOUNTER — Telehealth: Payer: Self-pay | Admitting: Gastroenterology

## 2018-11-29 DIAGNOSIS — K831 Obstruction of bile duct: Secondary | ICD-10-CM

## 2018-11-29 LAB — SURGICAL PATHOLOGY

## 2018-11-29 LAB — CYTOLOGY - NON PAP

## 2018-11-29 NOTE — Telephone Encounter (Signed)
Pt's daughter [name] called; she stated that she was under the impression that pt was to schedule a FU with Dr. Rush Landmark in two week, but availability is showing 01/19/19.  Pt would like to discuss.

## 2018-11-29 NOTE — Telephone Encounter (Signed)
Per the procedure report the pt has been scheduled for office visit on 12/31 and labs in 2 weeks.  The pt daughter has been advised.

## 2018-11-30 LAB — CYTOLOGY - NON PAP

## 2018-12-01 ENCOUNTER — Encounter: Payer: Self-pay | Admitting: Gastroenterology

## 2018-12-01 ENCOUNTER — Other Ambulatory Visit: Payer: Self-pay

## 2018-12-01 DIAGNOSIS — K8050 Calculus of bile duct without cholangitis or cholecystitis without obstruction: Secondary | ICD-10-CM

## 2018-12-07 ENCOUNTER — Ambulatory Visit: Payer: Self-pay | Admitting: Surgery

## 2018-12-07 DIAGNOSIS — K8050 Calculus of bile duct without cholangitis or cholecystitis without obstruction: Secondary | ICD-10-CM | POA: Diagnosis not present

## 2018-12-07 NOTE — H&P (View-Only) (Signed)
Surgical H&P  CC: abdominal pain  HPI: very nice 78 year old gentleman with significant medical/cardiac history as listed below who initially presented in March of this year with cholangitis and pancreatitis, which was preceded by months of abdominal discomfort.  He had undergone endoscopic ultrasound and ERCP during his admission with findings including sludge in the gallbladder and common duct on EUS as well as significant stricture of the distal common bile duct.  Nonfilling of the cystic duct was also noted.  A stent was placed at that time, cytology was performed revealing atypical cells. Cholecystectomy at that time was deferred as malignancy workup was ongoing and the biliary stricture thought to be the cause of his cholangitis and bacteremia, in addition to a finding increased risk of perioperative morbidity... Since that time he has undergone 2 more ERCPs as well as another EUS with fine-needle biopsy of the stricture as well as cholangioscopy with biopsies which have shown inflammation treatment changes.  Dr. Rush Landmark has exchanged his stents multiple times and notes that each time significant amounts of stones and sludge and debris are removed, most recently he had 3 plastic stents placed in the common bile duct to further dilate this area.  There has still been no definitive evidence of a mass or lesion within the bile duct or pancreas on imaging.  There is some possibility of autoimmune cholangiopathy.   Given repeated accumulation of stones and debris, he has been referred for consideration of cholecystectomy.  Ultimately he may require a covered metal stent or steroids for possible autoimmune cholangiopathy, but the source of choledocholithiasis needs to be removed. The patient has some GI issues with indigestion, he also describes bilateral lower extremities pain starting to ease which is relieved with rest.  He reports that he has been eating well overall.  He has not had any prior  abdominal surgeries.  Allergies  Allergen Reactions  . Losartan Potassium Other (See Comments)    Hyperkalemia  . Tape Itching and Rash    reddened skin     Past Medical History:  Diagnosis Date  . Allergy   . B12 deficiency anemia   . Blood transfusion without reported diagnosis   . CAD (coronary artery disease)   . Cancer ([place])    bladder-   . Colon polyps   . COPD (chronic obstructive pulmonary disease) ([place])   . Depression   . Esophagus, Barrett's   . GERD (gastroesophageal reflux disease)   . History of bladder cancer    Bladder cancer "8 times"  . History of hiatal hernia   . Hyperlipidemia   . Hypertension   . Localized osteoarthrosis, lower leg   . Restless leg syndrome   . Sleep apnea    does not wear cpap  . Stenosis of esophagus     Past Surgical History:  Procedure Laterality Date  . BILIARY BRUSHING  04/01/2018   Procedure: BILIARY BRUSHING;  Surgeon: Rush Landmark Telford Nab., MD;  Location: [place];  Service: Gastroenterology;;  . BILIARY BRUSHING  09/12/2018   Procedure: BILIARY BRUSHING;  Surgeon: Irving Copas., MD;  Location: [place];  Service: Gastroenterology;;  . BILIARY BRUSHING  11/28/2018   Procedure: BILIARY BRUSHING;  Surgeon: Irving Copas., MD;  Location: [place];  Service: Gastroenterology;;  . BILIARY DILATION  09/12/2018   Procedure: BILIARY DILATION;  Surgeon: Irving Copas., MD;  Location: [place];  Service: Gastroenterology;;  . BILIARY DILATION  11/28/2018   Procedure: BILIARY DILATION;  Surgeon: Irving Copas., MD;  Location: [facility];  Service: Gastroenterology;;  . BILIARY STENT PLACEMENT  04/01/2018   Procedure: BILIARY STENT PLACEMENT;  Surgeon: Rush Landmark Telford Nab., MD;  Location: [place];  Service: Gastroenterology;;  . BILIARY STENT PLACEMENT  09/12/2018   Procedure: BILIARY STENT PLACEMENT;  Surgeon: Irving Copas., MD;  Location: [place];  Service:  Gastroenterology;;  . BILIARY STENT PLACEMENT  11/28/2018   Procedure: BILIARY STENT PLACEMENT;  Surgeon: Irving Copas., MD;  Location: [place];  Service: Gastroenterology;;  . BIOPSY  04/01/2018   Procedure: BIOPSY;  Surgeon: Irving Copas., MD;  Location: [place];  Service: Gastroenterology;;  . BIOPSY  09/12/2018   Procedure: BIOPSY;  Surgeon: Irving Copas., MD;  Location: [place];  Service: Gastroenterology;;  . BIOPSY  11/28/2018   Procedure: BIOPSY;  Surgeon: Irving Copas., MD;  Location: [place];  Service: Gastroenterology;;  . bladder cancer      x 8 cystoscopy  . CERVICAL DISCECTOMY     ACDF  . COLONOSCOPY  11/17/2005   normal   . CORONARY ARTERY BYPASS GRAFT     x4  . ENDOSCOPIC MUCOSAL RESECTION  09/12/2018   Procedure: ENDOSCOPIC MUCOSAL RESECTION;  Surgeon: Rush Landmark Telford Nab., MD;  Location: [facility];  Service: Gastroenterology;;  . ENDOSCOPIC RETROGRADE CHOLANGIOPANCREATOGRAPHY (ERCP) WITH PROPOFOL N/A 04/01/2018   Procedure: ENDOSCOPIC RETROGRADE CHOLANGIOPANCREATOGRAPHY (ERCP) WITH PROPOFOL;  Surgeon: Irving Copas., MD;  Location: [place];  Service: Gastroenterology;  Laterality: N/A;  . ENDOSCOPIC RETROGRADE CHOLANGIOPANCREATOGRAPHY (ERCP) WITH PROPOFOL N/A 09/12/2018   Procedure: ENDOSCOPIC RETROGRADE CHOLANGIOPANCREATOGRAPHY (ERCP) WITH PROPOFOL;  Surgeon: Rush Landmark Telford Nab., MD;  Location: [place];  Service: Gastroenterology;  Laterality: N/A;  . ERCP N/A 11/28/2018   Procedure: ENDOSCOPIC RETROGRADE CHOLANGIOPANCREATOGRAPHY (ERCP) +EGD with spyglass;  Surgeon: Rush Landmark Telford Nab., MD;  Location: [place];  Service: Gastroenterology;  Laterality: N/A;  . ESOPHAGOGASTRODUODENOSCOPY  04/29/2010  . ESOPHAGOGASTRODUODENOSCOPY (EGD) WITH PROPOFOL N/A 04/01/2018   Procedure: ESOPHAGOGASTRODUODENOSCOPY (EGD) WITH PROPOFOL;  Surgeon: Rush Landmark Telford Nab., MD;  Location: [place];   Service: Gastroenterology;  Laterality: N/A;  . ESOPHAGOGASTRODUODENOSCOPY (EGD) WITH PROPOFOL N/A 09/12/2018   Procedure: ESOPHAGOGASTRODUODENOSCOPY (EGD) WITH PROPOFOL;  Surgeon: Rush Landmark Telford Nab., MD;  Location: [place];  Service: Gastroenterology;  Laterality: N/A;  . ESOPHAGOGASTRODUODENOSCOPY (EGD) WITH PROPOFOL N/A 11/28/2018   Procedure: ESOPHAGOGASTRODUODENOSCOPY (EGD) WITH PROPOFOL;  Surgeon: Rush Landmark Telford Nab., MD;  Location: [place];  Service: Gastroenterology;  Laterality: N/A;  . EUS  04/01/2018   Procedure: FULL UPPER ENDOSCOPIC ULTRASOUND (EUS) RADIAL;  Surgeon: Irving Copas., MD;  Location: [place];  Service: Gastroenterology;;  . EUS N/A 09/12/2018   Procedure: UPPER ENDOSCOPIC ULTRASOUND (EUS) RADIAL;  Surgeon: Irving Copas., MD;  Location: [place];  Service: Gastroenterology;  Laterality: N/A;  . FINE NEEDLE ASPIRATION  09/12/2018   Procedure: FINE NEEDLE ASPIRATION (FNA) LINEAR;  Surgeon: Irving Copas., MD;  Location: [place];  Service: Gastroenterology;;  . HEMOSTASIS CLIP PLACEMENT  09/12/2018   Procedure: HEMOSTASIS CLIP PLACEMENT;  Surgeon: Irving Copas., MD;  Location: [facility];  Service: Gastroenterology;;  . I&D EXTREMITY Left 11/24/2016   Procedure: IRRIGATION AND DEBRIDEMENT LEFT HAND, THUMB, INDEX, MIDDLE, RING, AND SMALL FINGERS WITH RECONSTRUCTION;  Surgeon: Roseanne Kaufman, MD;  Location: [place];  Service: Orthopedics;  Laterality: Left;  . KNEE ARTHROSCOPY Left   . LUMBAR LAMINECTOMY     and fusion x 2  . NASAL SINUS SURGERY    . POPLITEAL SYNOVIAL CYST EXCISION    .  REMOVAL OF STONES  04/01/2018   Procedure: REMOVAL OF STONES;  Surgeon: Rush Landmark Telford Nab., MD;  Location: [place];  Service: Gastroenterology;;  . REMOVAL OF STONES  09/12/2018   Procedure: REMOVAL OF STONES;  Surgeon: Irving Copas., MD;  Location: [place];  Service: Gastroenterology;;  . REMOVAL OF  STONES  11/28/2018   Procedure: REMOVAL OF STONES;  Surgeon: Irving Copas., MD;  Location: [place];  Service: Gastroenterology;;  . Azzie Almas DILATION N/A 09/12/2018   Procedure: Azzie Almas DILATION;  Surgeon: Irving Copas., MD;  Location: [place];  Service: Gastroenterology;  Laterality: N/A;  . SAVORY DILATION N/A 11/28/2018   Procedure: SAVORY DILATION;  Surgeon: Rush Landmark Telford Nab., MD;  Location: [place];  Service: Gastroenterology;  Laterality: N/A;  . SPHINCTEROTOMY  04/01/2018   Procedure: SPHINCTEROTOMY;  Surgeon: Rush Landmark Telford Nab., MD;  Location: [place];  Service: Gastroenterology;;  . Bess Kinds CHOLANGIOSCOPY N/A 11/28/2018   Procedure: [id number] CHOLANGIOSCOPY;  Surgeon: Irving Copas., MD;  Location: [place];  Service: Gastroenterology;  Laterality: N/A;  . STENT REMOVAL  09/12/2018   Procedure: STENT REMOVAL;  Surgeon: Irving Copas., MD;  Location: [place];  Service: Gastroenterology;;  . Lavell Islam REMOVAL  11/28/2018   Procedure: STENT REMOVAL;  Surgeon: Irving Copas., MD;  Location: [place];  Service: Gastroenterology;;  . Lia Foyer LIFTING INJECTION  09/12/2018   Procedure: SUBMUCOSAL LIFTING INJECTION;  Surgeon: Irving Copas., MD;  Location: [facility]OSCOPY;  Service: Gastroenterology;;    Family History  Problem Relation Age of Onset  . Melanoma Mother   . Stroke Father   . Hypertension Father   . Coronary artery disease Other   . Colon cancer Neg Hx   . Esophageal cancer Neg Hx   . Stomach cancer Neg Hx   . Rectal cancer Neg Hx   . Pancreatic cancer Neg Hx   . Liver disease Neg Hx   . Inflammatory bowel disease Neg Hx     Social History   Socioeconomic History  . Marital status: Married    Spouse name: Not on file  . Number of children: Not on file  . Years of education: Not on file  . Highest education level: Not on file  Occupational History  . Not on file  Social Needs   . Financial resource strain: Not on file  . Food insecurity    Worry: Not on file    Inability: Not on file  . Transportation needs    Medical: Not on file    Non-medical: Not on file  Tobacco Use  . Smoking status: Former Smoker    Packs/day: 0.50    Years: 5.00    Pack years: 2.50    Types: Cigarettes    Quit date: 03/30/1976    Years since quitting: 42.7  . Smokeless tobacco: Never Used  Substance and Sexual Activity  . Alcohol use: No    Alcohol/week: 0.0 standard drinks  . Drug use: No  . Sexual activity: Yes  Lifestyle  . Physical activity    Days per week: Not on file    Minutes per session: Not on file  . Stress: Not on file  Relationships  . Social Herbalist on phone: Not on file    Gets together: Not on file    Attends religious service: Not on file    Active member of club or organization: Not on file    Attends meetings of clubs or organizations: Not on  file    Relationship status: Not on file  Other Topics Concern  . Not on file  Social History Narrative  . Not on file    Current Outpatient Medications on File Prior to Visit  Medication Sig Dispense Refill  . acetaminophen (TYLENOL) 325 MG tablet Take 650 mg by mouth every 6 (six) hours as needed for mild pain.    [name] amLODipine (NORVASC) 2.5 MG tablet Take 1 tablet (2.5 mg total) by mouth daily. 180 tablet 3  . aspirin EC 81 MG tablet Take 81 mg by mouth at bedtime.    [name] BYSTOLIC 10 MG tablet Take 10 mg by mouth daily.    . cyanocobalamin (,VITAMIN B-12,) 1000 MCG/ML injection INJECT 1 ML (CC) INTRAMUSCULARLY FOR 1 DOSE (Patient taking differently: Inject 1,000 mcg into the muscle every 30 (thirty) days. ) 6 mL 3  . dicyclomine (BENTYL) 10 MG capsule Take 1 capsule (10 mg total) by mouth every 8 (eight) hours as needed for spasms. 3060 capsule 1  . ezetimibe (ZETIA) 10 MG tablet Take 1 tablet (10 mg total) by mouth daily. 90 tablet 1  . Fiber Complete TABS Take 1 tablet by mouth daily.    .  fluticasone (FLONASE) 50 MCG/ACT nasal spray Place 1-2 sprays into both nostrils at bedtime as needed for allergies or rhinitis.    . folic acid (FOLVITE) A999333 MCG tablet Take 400 mcg by mouth daily.      . isosorbide mononitrate (IMDUR) 60 MG 24 hr tablet Take 1 tablet (60 mg total) by mouth daily. 90 tablet 1  . Menthol, Topical Analgesic, (BENGAY EX) Apply 1 application topically daily as needed (pain).    . Multiple Vitamins-Minerals (MULTIVITAMIN WITH MINERALS) tablet Take 1 tablet by mouth daily.    . Naphazoline-Pheniramine (OPCON-A) 0.027-0.315 % SOLN Place 1 drop into both eyes daily as needed (itching eyes).    . NEEDLE, DISP, 25 G (B-D DISP NEEDLE 25GX1") 25G X 1" MISC Inject 1000 mcg into muscle once a month. 50 each 0  . nitroGLYCERIN (NITROSTAT) 0.4 MG SL tablet Place 1 tablet (0.4 mg total) under the tongue every 5 (five) minutes as needed for chest pain. 30 tablet 3  . oxyCODONE-acetaminophen (PERCOCET) 10-325 MG tablet Take 1 tablet by mouth 2 (two) times daily.    . pantoprazole (PROTONIX) 40 MG tablet Take 30- 60 min before your first and last meals of the day (Patient taking differently: Take 40 mg by mouth 2 (two) times daily before a meal. ) 60 tablet 3  . Probiotic Product (PROBIOTIC ADVANCED PO) Take 1 tablet by mouth 2 (two) times daily.     [name] rOPINIRole (REQUIP) 2 MG tablet TAKE 1 TABLET BY MOUTH AT BEDTIME (Patient taking differently: Take 2 mg by mouth at bedtime. ) 90 tablet 0  . rosuvastatin (CRESTOR) 20 MG tablet Take 20 mg by mouth daily.    [name] venlafaxine (EFFEXOR) 75 MG tablet Take 1 tablet (75 mg total) by mouth daily. 90 tablet 1   No current facility-administered medications on file prior to visit.     Review of Systems: a complete, 10pt review of systems was completed with pertinent positives and negatives as documented in the HPI  Physical Exam: Weight: 184.38 lb Height: 69in Body Surface Area: 2 m Body Mass Index: 27.23 kg/m  Temp.: 97.34F(Oral)   Pulse: 68 (Regular)  P.OX: 95% (Room air) BP: 120/62 (Sitting, Left Arm, Standard)  Gen: alert and cooperative Eye: extraocular motion intact, no scleral  icterus Chest: unlabored respirations, symmetrical air entry CV: regular rate and rhythm, no pedal edema Abdomen: soft, nontender, nondistended.  MSK: strength symmetrical throughout, no deformity Neuro: grossly intact, normal gait Psych: normal mood and affect, appropriate insight Skin: warm and dry, no rash or lesion on limited exam    CBC Latest Ref Rng & Units 07/27/2018 05/18/2018 04/04/2018  WBC 4.0 - 10.5 K/uL 8.4 6.5 11.7(H)  Hemoglobin 13.0 - 17.0 g/dL 12.6(L) 12.2(L) 9.7(L)  Hematocrit 39.0 - 52.0 % 37.5(L) 35.4(L) 30.4(L)  Platelets 150.0 - 400.0 K/uL 244.0 147.0(L) 130(L)    CMP Latest Ref Rng & Units 10/25/2018 07/27/2018 05/18/2018  Glucose 70 - 99 mg/dL - 164(H) 171(H)  BUN 6 - 23 mg/dL - 23 15  Creatinine 0.40 - 1.50 mg/dL - 1.64(H) 1.46  Sodium 135 - 145 mEq/L - 139 140  Potassium 3.5 - 5.1 mEq/L - 4.2 4.5  Chloride 96 - 112 mEq/L - 101 104  CO2 19 - 32 mEq/L - 30 29  Calcium 8.4 - 10.5 mg/dL - 9.1 8.8  Total Protein 6.0 - 8.3 g/dL 7.7 - 7.3  Total Bilirubin 0.2 - 1.2 mg/dL 0.4 - 0.4  Alkaline Phos 39 - 117 U/L 73 - 93  AST 0 - 37 U/L 15 - 14  ALT 0 - 53 U/L 7 - 8    Lab Results  Component Value Date   INR 1.1 (H) 05/18/2018   INR 1.0 RATIO 04/28/2006    Imaging: No results found.   A/P: CHOLEDOCHOLITHIASIS (K80.50) Story: I recommend proceeding with laparoscopic cholecystectomy. Discussed risks of surgery including bleeding, pain, scarring, intraabdominal injury specifically to the common bile duct and sequelae, bile leak, conversion to open surgery, failure to resolve symptoms, blood clots/ pulmonary embolus, heart attack, pneumonia, stroke, death. Questions welcomed and answered to patient's satisfaction. He wishes to proceed with surgery We will try to do this in the next couple of weeks- will need  cardiac clearance, his cardiologist is Dr. Claiborne Billings  Patient Active Problem List   Diagnosis Date Noted  . elevated IgG4 11/01/2018  . Choledocholithiasis 10/27/2018  . Dilation of biliary tract 10/27/2018  . Esophageal dysphagia 10/27/2018  . Upper airway cough syndrome 07/28/2018  . Pleural effusion on left 07/28/2018  . History of pancreatitis 05/12/2018  . Abnormal LFTs 05/12/2018  . Abnormal findings on esophagogastroduodenoscopy (EGD) 05/12/2018  . History of ERCP 05/12/2018  . Anemia 05/12/2018  . Biliary stricture   . ESBL (extended spectrum beta-lactamase) producing bacteria infection 04/04/2018  . Ascending cholangitis 04/01/2018  . Diabetes mellitus type 2, noninsulin dependent ([place]) 03/31/2018  . Pancreatitis 03/31/2018  . Sepsis ([place]) 03/31/2018  . Bacteremia due to Gram-negative bacteria 03/31/2018  . Abdominal pain   . Dilated bile duct   . Acute biliary pancreatitis 03/30/2018  . Leukocytosis 03/30/2018  . CAD (coronary artery disease) 03/30/2018  . Osteoarthritis of left knee 10/18/2017  . Cervical radiculopathy 10/04/2017  . Pain of left hand 07/19/2017  . Pre-operative cardiovascular examination 06/16/2017  . CKD (chronic kidney disease), stage III 06/16/2017  . Carpal tunnel syndrome 05/27/2017  . 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 05/10/2017  . Open fracture of base of middle phalanx of finger 02/17/2017  . Laceration of index finger 02/17/2017  . Laceration of nail bed of finger 02/17/2017  . Laceration of thumb 02/17/2017  . Open fracture of distal phalanx of finger 02/17/2017  . Open fractures of multiple sites of phalanx of left hand 11/24/2016  . B12 deficiency 05/28/2015  .  GERD (gastroesophageal reflux disease) 11/16/2014  . Hyperlipidemia LDL goal <70 11/21/2013  . DOE (dyspnea on exertion) 11/21/2013  . Other dysphagia 07/14/2013  . History of esophageal stricture 07/14/2013  . Hx of CABG 06/05/2013  . Leg pain, bilateral 10/18/2012  .  Restless leg syndrome 06/21/2012  . ULNAR NEUROPATHY, LEFT 08/05/2009  . Urinary obstruction 10/19/2008  . ACTINIC KERATOSIS 06/25/2008  . DRY EYE SYNDROME 10/13/2007  . CARCINOMA, BLADDER, HX OF 07/02/2007  . BARRETTS ESOPHAGUS 05/23/2007  . Hypothyroidism 04/08/2007  . Other malaise and fatigue 04/08/2007  . Essential hypertension 10/12/2006  . ANEMIA, B12 DEFICIENCY 09/08/2006  . DEPRESSION 07/29/2006  . NEUROPATHY, IDIOPATHIC PERIPHERAL NEC 07/29/2006  . Allergic rhinitis 07/29/2006  . LOW BACK PAIN 07/29/2006  . COLONIC POLYPS 11/04/2000       Romana Juniper, MD [facility] Surgery, [place]  See AMION to contact appropriate on-call provider

## 2018-12-07 NOTE — H&P (Signed)
Surgical H&P  CC: abdominal pain  HPI: very nice 78 year old gentleman with significant medical/cardiac history as listed below who initially presented in March of this year with cholangitis and pancreatitis, which was preceded by months of abdominal discomfort.  He had undergone endoscopic ultrasound and ERCP during his admission with findings including sludge in the gallbladder and common duct on EUS as well as significant stricture of the distal common bile duct.  Nonfilling of the cystic duct was also noted.  A stent was placed at that time, cytology was performed revealing atypical cells. Cholecystectomy at that time was deferred as malignancy workup was ongoing and the biliary stricture thought to be the cause of his cholangitis and bacteremia, in addition to a finding increased risk of perioperative morbidity... Since that time he has undergone 2 more ERCPs as well as another EUS with fine-needle biopsy of the stricture as well as cholangioscopy with biopsies which have shown inflammation treatment changes.  Dr. Rush Landmark has exchanged his stents multiple times and notes that each time significant amounts of stones and sludge and debris are removed, most recently he had 3 plastic stents placed in the common bile duct to further dilate this area.  There has still been no definitive evidence of a mass or lesion within the bile duct or pancreas on imaging.  There is some possibility of autoimmune cholangiopathy.   Given repeated accumulation of stones and debris, he has been referred for consideration of cholecystectomy.  Ultimately he may require a covered metal stent or steroids for possible autoimmune cholangiopathy, but the source of choledocholithiasis needs to be removed. The patient has some GI issues with indigestion, he also describes bilateral lower extremities pain starting to ease which is relieved with rest.  He reports that he has been eating well overall.  He has not had any prior  abdominal surgeries.  Allergies  Allergen Reactions  . Losartan Potassium Other (See Comments)    Hyperkalemia  . Tape Itching and Rash    reddened skin     Past Medical History:  Diagnosis Date  . Allergy   . B12 deficiency anemia   . Blood transfusion without reported diagnosis   . CAD (coronary artery disease)   . Cancer ([place])    bladder-   . Colon polyps   . COPD (chronic obstructive pulmonary disease) ([place])   . Depression   . Esophagus, Barrett's   . GERD (gastroesophageal reflux disease)   . History of bladder cancer    Bladder cancer "8 times"  . History of hiatal hernia   . Hyperlipidemia   . Hypertension   . Localized osteoarthrosis, lower leg   . Restless leg syndrome   . Sleep apnea    does not wear cpap  . Stenosis of esophagus     Past Surgical History:  Procedure Laterality Date  . BILIARY BRUSHING  04/01/2018   Procedure: BILIARY BRUSHING;  Surgeon: Rush Landmark Telford Nab., MD;  Location: [place];  Service: Gastroenterology;;  . BILIARY BRUSHING  09/12/2018   Procedure: BILIARY BRUSHING;  Surgeon: Irving Copas., MD;  Location: [place];  Service: Gastroenterology;;  . BILIARY BRUSHING  11/28/2018   Procedure: BILIARY BRUSHING;  Surgeon: Irving Copas., MD;  Location: [place];  Service: Gastroenterology;;  . BILIARY DILATION  09/12/2018   Procedure: BILIARY DILATION;  Surgeon: Irving Copas., MD;  Location: [place];  Service: Gastroenterology;;  . BILIARY DILATION  11/28/2018   Procedure: BILIARY DILATION;  Surgeon: Irving Copas., MD;  Location: [facility];  Service: Gastroenterology;;  . BILIARY STENT PLACEMENT  04/01/2018   Procedure: BILIARY STENT PLACEMENT;  Surgeon: Rush Landmark Telford Nab., MD;  Location: [place];  Service: Gastroenterology;;  . BILIARY STENT PLACEMENT  09/12/2018   Procedure: BILIARY STENT PLACEMENT;  Surgeon: Irving Copas., MD;  Location: [place];  Service:  Gastroenterology;;  . BILIARY STENT PLACEMENT  11/28/2018   Procedure: BILIARY STENT PLACEMENT;  Surgeon: Irving Copas., MD;  Location: [place];  Service: Gastroenterology;;  . BIOPSY  04/01/2018   Procedure: BIOPSY;  Surgeon: Irving Copas., MD;  Location: [place];  Service: Gastroenterology;;  . BIOPSY  09/12/2018   Procedure: BIOPSY;  Surgeon: Irving Copas., MD;  Location: [place];  Service: Gastroenterology;;  . BIOPSY  11/28/2018   Procedure: BIOPSY;  Surgeon: Irving Copas., MD;  Location: [place];  Service: Gastroenterology;;  . bladder cancer      x 8 cystoscopy  . CERVICAL DISCECTOMY     ACDF  . COLONOSCOPY  11/17/2005   normal   . CORONARY ARTERY BYPASS GRAFT     x4  . ENDOSCOPIC MUCOSAL RESECTION  09/12/2018   Procedure: ENDOSCOPIC MUCOSAL RESECTION;  Surgeon: Rush Landmark Telford Nab., MD;  Location: [facility];  Service: Gastroenterology;;  . ENDOSCOPIC RETROGRADE CHOLANGIOPANCREATOGRAPHY (ERCP) WITH PROPOFOL N/A 04/01/2018   Procedure: ENDOSCOPIC RETROGRADE CHOLANGIOPANCREATOGRAPHY (ERCP) WITH PROPOFOL;  Surgeon: Irving Copas., MD;  Location: [place];  Service: Gastroenterology;  Laterality: N/A;  . ENDOSCOPIC RETROGRADE CHOLANGIOPANCREATOGRAPHY (ERCP) WITH PROPOFOL N/A 09/12/2018   Procedure: ENDOSCOPIC RETROGRADE CHOLANGIOPANCREATOGRAPHY (ERCP) WITH PROPOFOL;  Surgeon: Rush Landmark Telford Nab., MD;  Location: [place];  Service: Gastroenterology;  Laterality: N/A;  . ERCP N/A 11/28/2018   Procedure: ENDOSCOPIC RETROGRADE CHOLANGIOPANCREATOGRAPHY (ERCP) +EGD with spyglass;  Surgeon: Rush Landmark Telford Nab., MD;  Location: [place];  Service: Gastroenterology;  Laterality: N/A;  . ESOPHAGOGASTRODUODENOSCOPY  04/29/2010  . ESOPHAGOGASTRODUODENOSCOPY (EGD) WITH PROPOFOL N/A 04/01/2018   Procedure: ESOPHAGOGASTRODUODENOSCOPY (EGD) WITH PROPOFOL;  Surgeon: Rush Landmark Telford Nab., MD;  Location: [place];   Service: Gastroenterology;  Laterality: N/A;  . ESOPHAGOGASTRODUODENOSCOPY (EGD) WITH PROPOFOL N/A 09/12/2018   Procedure: ESOPHAGOGASTRODUODENOSCOPY (EGD) WITH PROPOFOL;  Surgeon: Rush Landmark Telford Nab., MD;  Location: [place];  Service: Gastroenterology;  Laterality: N/A;  . ESOPHAGOGASTRODUODENOSCOPY (EGD) WITH PROPOFOL N/A 11/28/2018   Procedure: ESOPHAGOGASTRODUODENOSCOPY (EGD) WITH PROPOFOL;  Surgeon: Rush Landmark Telford Nab., MD;  Location: [place];  Service: Gastroenterology;  Laterality: N/A;  . EUS  04/01/2018   Procedure: FULL UPPER ENDOSCOPIC ULTRASOUND (EUS) RADIAL;  Surgeon: Irving Copas., MD;  Location: [place];  Service: Gastroenterology;;  . EUS N/A 09/12/2018   Procedure: UPPER ENDOSCOPIC ULTRASOUND (EUS) RADIAL;  Surgeon: Irving Copas., MD;  Location: [place];  Service: Gastroenterology;  Laterality: N/A;  . FINE NEEDLE ASPIRATION  09/12/2018   Procedure: FINE NEEDLE ASPIRATION (FNA) LINEAR;  Surgeon: Irving Copas., MD;  Location: [place];  Service: Gastroenterology;;  . HEMOSTASIS CLIP PLACEMENT  09/12/2018   Procedure: HEMOSTASIS CLIP PLACEMENT;  Surgeon: Irving Copas., MD;  Location: [facility] ENDOSCOPY;  Service: Gastroenterology;;  . I&D EXTREMITY Left 11/24/2016   Procedure: IRRIGATION AND DEBRIDEMENT LEFT HAND, THUMB, INDEX, MIDDLE, RING, AND SMALL FINGERS WITH RECONSTRUCTION;  Surgeon: Roseanne Kaufman, MD;  Location: [place];  Service: Orthopedics;  Laterality: Left;  . KNEE ARTHROSCOPY Left   . LUMBAR LAMINECTOMY     and fusion x 2  . NASAL SINUS SURGERY    . POPLITEAL SYNOVIAL CYST EXCISION    .  REMOVAL OF STONES  04/01/2018   Procedure: REMOVAL OF STONES;  Surgeon: Rush Landmark Telford Nab., MD;  Location: [place];  Service: Gastroenterology;;  . REMOVAL OF STONES  09/12/2018   Procedure: REMOVAL OF STONES;  Surgeon: Irving Copas., MD;  Location: [place];  Service: Gastroenterology;;  . REMOVAL OF  STONES  11/28/2018   Procedure: REMOVAL OF STONES;  Surgeon: Irving Copas., MD;  Location: [place];  Service: Gastroenterology;;  . Azzie Almas DILATION N/A 09/12/2018   Procedure: Azzie Almas DILATION;  Surgeon: Irving Copas., MD;  Location: [place];  Service: Gastroenterology;  Laterality: N/A;  . SAVORY DILATION N/A 11/28/2018   Procedure: SAVORY DILATION;  Surgeon: Rush Landmark Telford Nab., MD;  Location: [place];  Service: Gastroenterology;  Laterality: N/A;  . SPHINCTEROTOMY  04/01/2018   Procedure: SPHINCTEROTOMY;  Surgeon: Rush Landmark Telford Nab., MD;  Location: [place];  Service: Gastroenterology;;  . Bess Kinds CHOLANGIOSCOPY N/A 11/28/2018   Procedure: [id number] CHOLANGIOSCOPY;  Surgeon: Irving Copas., MD;  Location: [place];  Service: Gastroenterology;  Laterality: N/A;  . STENT REMOVAL  09/12/2018   Procedure: STENT REMOVAL;  Surgeon: Irving Copas., MD;  Location: [place];  Service: Gastroenterology;;  . Lavell Islam REMOVAL  11/28/2018   Procedure: STENT REMOVAL;  Surgeon: Irving Copas., MD;  Location: [place];  Service: Gastroenterology;;  . Lia Foyer LIFTING INJECTION  09/12/2018   Procedure: SUBMUCOSAL LIFTING INJECTION;  Surgeon: Irving Copas., MD;  Location: [facility] ENDOSCOPY;  Service: Gastroenterology;;    Family History  Problem Relation Age of Onset  . Melanoma Mother   . Stroke Father   . Hypertension Father   . Coronary artery disease Other   . Colon cancer Neg Hx   . Esophageal cancer Neg Hx   . Stomach cancer Neg Hx   . Rectal cancer Neg Hx   . Pancreatic cancer Neg Hx   . Liver disease Neg Hx   . Inflammatory bowel disease Neg Hx     Social History   Socioeconomic History  . Marital status: Married    Spouse name: Not on file  . Number of children: Not on file  . Years of education: Not on file  . Highest education level: Not on file  Occupational History  . Not on file  Social Needs   . Financial resource strain: Not on file  . Food insecurity    Worry: Not on file    Inability: Not on file  . Transportation needs    Medical: Not on file    Non-medical: Not on file  Tobacco Use  . Smoking status: Former Smoker    Packs/day: 0.50    Years: 5.00    Pack years: 2.50    Types: Cigarettes    Quit date: 03/30/1976    Years since quitting: 42.7  . Smokeless tobacco: Never Used  Substance and Sexual Activity  . Alcohol use: No    Alcohol/week: 0.0 standard drinks  . Drug use: No  . Sexual activity: Yes  Lifestyle  . Physical activity    Days per week: Not on file    Minutes per session: Not on file  . Stress: Not on file  Relationships  . Social Herbalist on phone: Not on file    Gets together: Not on file    Attends religious service: Not on file    Active member of club or organization: Not on file    Attends meetings of clubs or organizations: Not on  file    Relationship status: Not on file  Other Topics Concern  . Not on file  Social History Narrative  . Not on file    Current Outpatient Medications on File Prior to Visit  Medication Sig Dispense Refill  . acetaminophen (TYLENOL) 325 MG tablet Take 650 mg by mouth every 6 (six) hours as needed for mild pain.    [name] amLODipine (NORVASC) 2.5 MG tablet Take 1 tablet (2.5 mg total) by mouth daily. 180 tablet 3  . aspirin EC 81 MG tablet Take 81 mg by mouth at bedtime.    [name] BYSTOLIC 10 MG tablet Take 10 mg by mouth daily.    . cyanocobalamin (,VITAMIN B-12,) 1000 MCG/ML injection INJECT 1 ML (CC) INTRAMUSCULARLY FOR 1 DOSE (Patient taking differently: Inject 1,000 mcg into the muscle every 30 (thirty) days. ) 6 mL 3  . dicyclomine (BENTYL) 10 MG capsule Take 1 capsule (10 mg total) by mouth every 8 (eight) hours as needed for spasms. 3060 capsule 1  . ezetimibe (ZETIA) 10 MG tablet Take 1 tablet (10 mg total) by mouth daily. 90 tablet 1  . Fiber Complete TABS Take 1 tablet by mouth daily.    .  fluticasone (FLONASE) 50 MCG/ACT nasal spray Place 1-2 sprays into both nostrils at bedtime as needed for allergies or rhinitis.    . folic acid (FOLVITE) A999333 MCG tablet Take 400 mcg by mouth daily.      . isosorbide mononitrate (IMDUR) 60 MG 24 hr tablet Take 1 tablet (60 mg total) by mouth daily. 90 tablet 1  . Menthol, Topical Analgesic, (BENGAY EX) Apply 1 application topically daily as needed (pain).    . Multiple Vitamins-Minerals (MULTIVITAMIN WITH MINERALS) tablet Take 1 tablet by mouth daily.    . Naphazoline-Pheniramine (OPCON-A) 0.027-0.315 % SOLN Place 1 drop into both eyes daily as needed (itching eyes).    . NEEDLE, DISP, 25 G (B-D DISP NEEDLE 25GX1") 25G X 1" MISC Inject 1000 mcg into muscle once a month. 50 each 0  . nitroGLYCERIN (NITROSTAT) 0.4 MG SL tablet Place 1 tablet (0.4 mg total) under the tongue every 5 (five) minutes as needed for chest pain. 30 tablet 3  . oxyCODONE-acetaminophen (PERCOCET) 10-325 MG tablet Take 1 tablet by mouth 2 (two) times daily.    . pantoprazole (PROTONIX) 40 MG tablet Take 30- 60 min before your first and last meals of the day (Patient taking differently: Take 40 mg by mouth 2 (two) times daily before a meal. ) 60 tablet 3  . Probiotic Product (PROBIOTIC ADVANCED PO) Take 1 tablet by mouth 2 (two) times daily.     [name] rOPINIRole (REQUIP) 2 MG tablet TAKE 1 TABLET BY MOUTH AT BEDTIME (Patient taking differently: Take 2 mg by mouth at bedtime. ) 90 tablet 0  . rosuvastatin (CRESTOR) 20 MG tablet Take 20 mg by mouth daily.    [name] venlafaxine (EFFEXOR) 75 MG tablet Take 1 tablet (75 mg total) by mouth daily. 90 tablet 1   No current facility-administered medications on file prior to visit.     Review of Systems: a complete, 10pt review of systems was completed with pertinent positives and negatives as documented in the HPI  Physical Exam: Weight: 184.38 lb Height: 69in Body Surface Area: 2 m Body Mass Index: 27.23 kg/m  Temp.: 97.65F(Oral)   Pulse: 68 (Regular)  P.OX: 95% (Room air) BP: 120/62 (Sitting, Left Arm, Standard)  Gen: alert and cooperative Eye: extraocular motion intact, no scleral  icterus Chest: unlabored respirations, symmetrical air entry CV: regular rate and rhythm, no pedal edema Abdomen: soft, nontender, nondistended.  MSK: strength symmetrical throughout, no deformity Neuro: grossly intact, normal gait Psych: normal mood and affect, appropriate insight Skin: warm and dry, no rash or lesion on limited exam    CBC Latest Ref Rng & Units 07/27/2018 05/18/2018 04/04/2018  WBC 4.0 - 10.5 K/uL 8.4 6.5 11.7(H)  Hemoglobin 13.0 - 17.0 g/dL 12.6(L) 12.2(L) 9.7(L)  Hematocrit 39.0 - 52.0 % 37.5(L) 35.4(L) 30.4(L)  Platelets 150.0 - 400.0 K/uL 244.0 147.0(L) 130(L)    CMP Latest Ref Rng & Units 10/25/2018 07/27/2018 05/18/2018  Glucose 70 - 99 mg/dL - 164(H) 171(H)  BUN 6 - 23 mg/dL - 23 15  Creatinine 0.40 - 1.50 mg/dL - 1.64(H) 1.46  Sodium 135 - 145 mEq/L - 139 140  Potassium 3.5 - 5.1 mEq/L - 4.2 4.5  Chloride 96 - 112 mEq/L - 101 104  CO2 19 - 32 mEq/L - 30 29  Calcium 8.4 - 10.5 mg/dL - 9.1 8.8  Total Protein 6.0 - 8.3 g/dL 7.7 - 7.3  Total Bilirubin 0.2 - 1.2 mg/dL 0.4 - 0.4  Alkaline Phos 39 - 117 U/L 73 - 93  AST 0 - 37 U/L 15 - 14  ALT 0 - 53 U/L 7 - 8    Lab Results  Component Value Date   INR 1.1 (H) 05/18/2018   INR 1.0 RATIO 04/28/2006    Imaging: No results found.   A/P: CHOLEDOCHOLITHIASIS (K80.50) Story: I recommend proceeding with laparoscopic cholecystectomy. Discussed risks of surgery including bleeding, pain, scarring, intraabdominal injury specifically to the common bile duct and sequelae, bile leak, conversion to open surgery, failure to resolve symptoms, blood clots/ pulmonary embolus, heart attack, pneumonia, stroke, death. Questions welcomed and answered to patient's satisfaction. He wishes to proceed with surgery We will try to do this in the next couple of weeks- will need  cardiac clearance, his cardiologist is Dr. Claiborne Billings  Patient Active Problem List   Diagnosis Date Noted  . elevated IgG4 11/01/2018  . Choledocholithiasis 10/27/2018  . Dilation of biliary tract 10/27/2018  . Esophageal dysphagia 10/27/2018  . Upper airway cough syndrome 07/28/2018  . Pleural effusion on left 07/28/2018  . History of pancreatitis 05/12/2018  . Abnormal LFTs 05/12/2018  . Abnormal findings on esophagogastroduodenoscopy (EGD) 05/12/2018  . History of ERCP 05/12/2018  . Anemia 05/12/2018  . Biliary stricture   . ESBL (extended spectrum beta-lactamase) producing bacteria infection 04/04/2018  . Ascending cholangitis 04/01/2018  . Diabetes mellitus type 2, noninsulin dependent ([place]) 03/31/2018  . Pancreatitis 03/31/2018  . Sepsis ([place]) 03/31/2018  . Bacteremia due to Gram-negative bacteria 03/31/2018  . Abdominal pain   . Dilated bile duct   . Acute biliary pancreatitis 03/30/2018  . Leukocytosis 03/30/2018  . CAD (coronary artery disease) 03/30/2018  . Osteoarthritis of left knee 10/18/2017  . Cervical radiculopathy 10/04/2017  . Pain of left hand 07/19/2017  . Pre-operative cardiovascular examination 06/16/2017  . CKD (chronic kidney disease), stage III 06/16/2017  . Carpal tunnel syndrome 05/27/2017  . 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 05/10/2017  . Open fracture of base of middle phalanx of finger 02/17/2017  . Laceration of index finger 02/17/2017  . Laceration of nail bed of finger 02/17/2017  . Laceration of thumb 02/17/2017  . Open fracture of distal phalanx of finger 02/17/2017  . Open fractures of multiple sites of phalanx of left hand 11/24/2016  . B12 deficiency 05/28/2015  .  GERD (gastroesophageal reflux disease) 11/16/2014  . Hyperlipidemia LDL goal <70 11/21/2013  . DOE (dyspnea on exertion) 11/21/2013  . Other dysphagia 07/14/2013  . History of esophageal stricture 07/14/2013  . Hx of CABG 06/05/2013  . Leg pain, bilateral 10/18/2012  .  Restless leg syndrome 06/21/2012  . ULNAR NEUROPATHY, LEFT 08/05/2009  . Urinary obstruction 10/19/2008  . ACTINIC KERATOSIS 06/25/2008  . DRY EYE SYNDROME 10/13/2007  . CARCINOMA, BLADDER, HX OF 07/02/2007  . BARRETTS ESOPHAGUS 05/23/2007  . Hypothyroidism 04/08/2007  . Other malaise and fatigue 04/08/2007  . Essential hypertension 10/12/2006  . ANEMIA, B12 DEFICIENCY 09/08/2006  . DEPRESSION 07/29/2006  . NEUROPATHY, IDIOPATHIC PERIPHERAL NEC 07/29/2006  . Allergic rhinitis 07/29/2006  . LOW BACK PAIN 07/29/2006  . COLONIC POLYPS 11/04/2000       Romana Juniper, MD [facility], [place]  See AMION to contact appropriate on-call provider

## 2018-12-09 ENCOUNTER — Telehealth: Payer: Self-pay

## 2018-12-09 NOTE — Telephone Encounter (Signed)
   [facility] Pre-operative Risk Assessment    Request for surgical clearance:  >>>>URGENT PER FAX<<<  1. What type of surgery is being performed? LAPAROSCOPIC CHOLECYSTECTOMY   2. When is this surgery scheduled? TBD   3. What type of clearance is required (medical clearance vs. Pharmacy clearance to hold med vs. Both)? MEDICAL  4. Are there any medications that need to be held prior to surgery and how long? NONE LISTED   5. Practice name and name of physician performing surgery? [facility] ATTN:ARMEN   6. What is your office phone number [phone]   7.   What is your office fax number  [phone]  8.   Anesthesia type (None, local, MAC, general) ? GENERAL

## 2018-12-09 NOTE — Telephone Encounter (Signed)
Cardiac clearance marked as urgent. I called his home phone number twice, mailbox is full, unable to leave message. I left a message on his cell phone to call back and speak to the preop APP of the day. Last myoview in 12.2019 was low, if he does not have cardiac issues recently, then he will be cleared. Waiting for the patient to call back

## 2018-12-12 ENCOUNTER — Other Ambulatory Visit (INDEPENDENT_AMBULATORY_CARE_PROVIDER_SITE_OTHER): Payer: Medicare Other

## 2018-12-12 DIAGNOSIS — K831 Obstruction of bile duct: Secondary | ICD-10-CM

## 2018-12-12 LAB — ALT: ALT: 9 U/L (ref 0–53)

## 2018-12-12 LAB — ALKALINE PHOSPHATASE: Alkaline Phosphatase: 73 U/L (ref 39–117)

## 2018-12-12 LAB — BILIRUBIN, DIRECT: Bilirubin, Direct: 0.1 mg/dL (ref 0.0–0.3)

## 2018-12-12 LAB — AST: AST: 18 U/L (ref 0–37)

## 2018-12-12 NOTE — Telephone Encounter (Signed)
   Primary Cardiologist: [name], MD  Chart reviewed as part of pre-operative protocol coverage. Patient was contacted 12/12/2018 in reference to pre-operative risk assessment for pending surgery as outlined below.  [name] was last seen on 10/10/2018 by Dr. Claiborne Billings.  Since that day, [name] has done well from a cardiac standpoint. He has a low risk Myoview in 11/2017 and normal LVEF by echo with grade 1 diastolic dysfunction at that time.  He has had issues with shortness of breath but today states that he is "doing absolutely great" with no chest discomfort or shortness of breath. He says that his shortness of breath is much better since seeing a pulmonologist.  He is able to climb a flight of stairs and walk a distance on flat ground but has difficulty going up hills.  His limitation is mostly due to leg pain and not breathing.  According to the RCRI, [name] is a class II risk with 0.9% risk of major cardiac event perioperatively.  Therefore, based on ACC/AHA guidelines, the patient would be at acceptable risk for the planned procedure without further cardiovascular testing.   There is no request to hold aspirin for the surgery.  We recommend continuing aspirin 81 mg through procedures if possible, however, this can be held if deemed necessary.  I will route this recommendation to the requesting party via Epic fax function and remove from pre-op pool.  Please call with questions.  [name], NP 12/12/2018, 9:32 AM

## 2018-12-28 NOTE — Patient Instructions (Addendum)
DUE TO COVID-19 ONLY ONE VISITOR IS ALLOWED TO COME WITH YOU AND STAY IN THE WAITING ROOM ONLY DURING PRE OP AND PROCEDURE DAY OF SURGERY. THE 1 VISITOR MAY VISIT WITH YOU AFTER SURGERY IN YOUR PRIVATE ROOM DURING VISITING HOURS ONLY!   ONCE YOUR COVID TEST IS COMPLETED, PLEASE BEGIN THE QUARANTINE INSTRUCTIONS AS OUTLINED IN YOUR HANDOUT.                [name]    Your procedure is scheduled on: 12/14   Report to [facility] Main  Entrance   Report to admitting at 12:00 PM     Call this number if you have problems the morning of surgery [phone]    Remember: Do not eat food after Midnight. Clear liquids until 8:00am DOS    CLEAR LIQUID DIET   Foods Allowed                                                                     Foods Excluded  Coffee and tea, regular and decaf                             liquids that you cannot  Plain Jell-O any favor except red or purple                                           see through such as: Fruit ices (not with fruit pulp)                                     milk, soups, orange juice  Iced Popsicles                                    All solid food Carbonated beverages, regular and diet                                    Cranberry, grape and apple juices Sports drinks like Gatorade Lightly seasoned clear broth or consume(fat free) Sugar, honey syrup   BRUSH YOUR TEETH MORNING OF SURGERY AND RINSE YOUR MOUTH OUT, NO CHEWING GUM CANDY OR MINTS.     Take these medicines the morning of surgery with A SIP OF WATER:  Oxycodone, Venlafaxine, Bystolic, Amlodipine, Protonix                  You may not have any metal on your body including piercings             Do not wear jewelry, lotions, powders or  deodorant                       Men may shave face and neck.   Do not bring valuables to the hospital. [facility] IS NOT  RESPONSIBLE   FOR VALUABLES.  Contacts, dentures or bridgework may not be worn  into surgery.       Patients discharged the day of surgery will not be allowed to drive home.   IF YOU ARE HAVING SURGERY AND GOING HOME THE SAME DAY, YOU MUST HAVE AN ADULT TO DRIVE YOU HOME AND BE WITH YOU FOR 24 HOURS  . YOU MAY GO HOME BY TAXI OR UBER OR ORTHERWISE, BUT AN ADULT MUST ACCOMPANY YOU HOME AND STAY WITH YOU FOR 24 HOURS.  Name and phone number of your driver:  Special Instructions: N/A              Please read over the following fact sheets you were given: _____________________________________________________________________             [facility] - Preparing for Surgery  Before surgery, you can play an important role.   Because skin is not sterile, your skin needs to be as free of germs as possible .  You can reduce the number of germs on your skin by washing with CHG (chlorahexidine gluconate) soap before surgery .  CHG is an antiseptic cleaner which kills germs and bonds with the skin to continue killing germs even after washing. Please DO NOT use if you have an allergy to CHG or antibacterial soaps.   If your skin becomes reddened/irritated stop using the CHG and inform your nurse when you arrive at Short Stay.   You may shave your face/neck.  Please follow these instructions carefully:  1.  Shower with CHG Soap the night before surgery and the  morning of Surgery.  2.  If you choose to wash your hair, wash your hair first as usual with your  normal  shampoo.  3.  After you shampoo, rinse your hair and body thoroughly to remove the  shampoo.                                        4.  Use CHG as you would any other liquid soap.  You can apply chg directly  to the skin and wash                       Gently with a scrungie or clean washcloth.  5.  Apply the CHG Soap to your body ONLY FROM THE NECK DOWN.   Do not use on face/ open                           Wound or open sores. Avoid contact with eyes, ears mouth and genitals (private parts).                        Wash face,  Genitals (private parts) with your normal soap.             6.  Wash thoroughly, paying special attention to the area where your surgery  will be performed.  7.  Thoroughly rinse your body with warm water from the neck down.  8.  DO NOT shower/wash with your normal soap after using and rinsing off  the CHG Soap.             9.  Pat yourself dry with a clean towel.  10.  Wear clean pajamas.            11.  Place clean sheets on your bed the night of your first shower and do not  sleep with pets. Day of Surgery : Do not apply any lotions/deodorants the morning of surgery.  Please wear clean clothes to the hospital/surgery center.  FAILURE TO FOLLOW THESE INSTRUCTIONS MAY RESULT IN THE CANCELLATION OF YOUR SURGERY PATIENT SIGNATURE_________________________________  NURSE SIGNATURE__________________________________  ________________________________________________________________________   [name]  An incentive spirometer is a tool that can help keep your lungs clear and active. This tool measures how well you are filling your lungs with each breath. Taking long deep breaths may help reverse or decrease the chance of developing breathing (pulmonary) problems (especially infection) following:  A long period of time when you are unable to move or be active. BEFORE THE PROCEDURE   If the spirometer includes an indicator to show your best effort, your nurse or respiratory therapist will set it to a desired goal.  If possible, sit up straight or lean slightly forward. Try not to slouch.  Hold the incentive spirometer in an upright position. INSTRUCTIONS FOR USE  1. Sit on the edge of your bed if possible, or sit up as far as you can in bed or on a chair. 2. Hold the incentive spirometer in an upright position. 3. Breathe out normally. 4. Place the mouthpiece in your mouth and seal your lips tightly around it. 5. Breathe in slowly and as deeply as possible,  raising the piston or the ball toward the top of the column. 6. Hold your breath for 3-5 seconds or for as long as possible. Allow the piston or ball to fall to the bottom of the column. 7. Remove the mouthpiece from your mouth and breathe out normally. 8. Rest for a few seconds and repeat Steps 1 through 7 at least 10 times every 1-2 hours when you are awake. Take your time and take a few normal breaths between deep breaths. 9. The spirometer may include an indicator to show your best effort. Use the indicator as a goal to work toward during each repetition. 10. After each set of 10 deep breaths, practice coughing to be sure your lungs are clear. If you have an incision (the cut made at the time of surgery), support your incision when coughing by placing a pillow or rolled up towels firmly against it. Once you are able to get out of bed, walk around indoors and cough well. You may stop using the incentive spirometer when instructed by your caregiver.  RISKS AND COMPLICATIONS  Take your time so you do not get dizzy or light-headed.  If you are in pain, you may need to take or ask for pain medication before doing incentive spirometry. It is harder to take a deep breath if you are having pain. AFTER USE  Rest and breathe slowly and easily.  It can be helpful to keep track of a log of your progress. Your caregiver can provide you with a simple table to help with this. If you are using the spirometer at home, follow these instructions: Ball IF:   You are having difficultly using the spirometer.  You have trouble using the spirometer as often as instructed.  Your pain medication is not giving enough relief while using the spirometer.  You develop fever of 100.5 F (38.1 C) or higher. SEEK IMMEDIATE MEDICAL CARE IF:   You cough up bloody sputum  that had not been present before.  You develop fever of 102 F (38.9 C) or greater.  You develop worsening pain at or near the  incision site. MAKE SURE YOU:   Understand these instructions.  Will watch your condition.  Will get help right away if you are not doing well or get worse. Document Released: 05/18/2006 Document Revised: 03/30/2011 Document Reviewed: 07/19/2006 [facility] Patient Information 2014 [place], [place].   ________________________________________________________________________

## 2018-12-29 ENCOUNTER — Encounter (HOSPITAL_COMMUNITY)
Admission: RE | Admit: 2018-12-29 | Discharge: 2018-12-29 | Disposition: A | Discharge status: Home / Self Care | Payer: Medicare Other | Source: Ambulatory Visit | Attending: Surgery | Admitting: Surgery

## 2018-12-29 ENCOUNTER — Other Ambulatory Visit: Payer: Self-pay

## 2018-12-29 ENCOUNTER — Encounter (INDEPENDENT_AMBULATORY_CARE_PROVIDER_SITE_OTHER): Payer: Self-pay

## 2018-12-29 ENCOUNTER — Encounter (HOSPITAL_COMMUNITY): Payer: Self-pay

## 2018-12-29 ENCOUNTER — Other Ambulatory Visit (HOSPITAL_COMMUNITY)
Admission: RE | Admit: 2018-12-29 | Discharge: 2018-12-29 | Disposition: A | Discharge status: Home / Self Care | Payer: Medicare Other | Source: Ambulatory Visit | Attending: Surgery | Admitting: Surgery

## 2018-12-29 DIAGNOSIS — Z20828 Contact with and (suspected) exposure to other viral communicable diseases: Secondary | ICD-10-CM | POA: Diagnosis not present

## 2018-12-29 DIAGNOSIS — Z01812 Encounter for preprocedural laboratory examination: Secondary | ICD-10-CM | POA: Insufficient documentation

## 2018-12-29 LAB — COMPREHENSIVE METABOLIC PANEL
ALT: 14 U/L (ref 0–44)
AST: 21 U/L (ref 15–41)
Albumin: 3.8 g/dL (ref 3.5–5.0)
Alkaline Phosphatase: 64 U/L (ref 38–126)
Anion gap: 8 (ref 5–15)
BUN: 19 mg/dL (ref 8–23)
CO2: 29 mmol/L (ref 22–32)
Calcium: 8.8 mg/dL — ABNORMAL LOW (ref 8.9–10.3)
Chloride: 104 mmol/L (ref 98–111)
Creatinine, Ser: 1.4 mg/dL — ABNORMAL HIGH (ref 0.61–1.24)
GFR calc Af Amer: 56 mL/min — ABNORMAL LOW (ref >60)
GFR calc non Af Amer: 48 mL/min — ABNORMAL LOW (ref >60)
Glucose, Bld: 162 mg/dL — ABNORMAL HIGH (ref 70–99)
Potassium: 4.1 mmol/L (ref 3.5–5.1)
Sodium: 141 mmol/L (ref 135–145)
Total Bilirubin: 0.8 mg/dL (ref 0.3–1.2)
Total Protein: 7.5 g/dL (ref 6.5–8.1)

## 2018-12-29 LAB — CBC WITH DIFFERENTIAL/PLATELET
Abs Immature Granulocytes: 0.03 10*3/uL (ref 0.00–0.07)
Basophils Absolute: 0.1 10*3/uL (ref 0.0–0.1)
Basophils Relative: 1 %
Eosinophils Absolute: 0.3 10*3/uL (ref 0.0–0.5)
Eosinophils Relative: 3 %
HCT: 39.5 % (ref 39.0–52.0)
Hemoglobin: 12.4 g/dL — ABNORMAL LOW (ref 13.0–17.0)
Immature Granulocytes: 0 %
Lymphocytes Relative: 18 %
Lymphs Abs: 1.9 10*3/uL (ref 0.7–4.0)
MCH: 27.7 pg (ref 26.0–34.0)
MCHC: 31.4 g/dL (ref 30.0–36.0)
MCV: 88.4 fL (ref 80.0–100.0)
Monocytes Absolute: 0.6 10*3/uL (ref 0.1–1.0)
Monocytes Relative: 6 %
Neutro Abs: 7.5 10*3/uL (ref 1.7–7.7)
Neutrophils Relative %: 72 %
Platelets: 192 10*3/uL (ref 150–400)
RBC: 4.47 MIL/uL (ref 4.22–5.81)
RDW: 14.3 % (ref 11.5–15.5)
WBC: 10.4 10*3/uL (ref 4.0–10.5)
nRBC: 0 % (ref 0.0–0.2)

## 2018-12-29 NOTE — Progress Notes (Signed)
PCP - Dr. Deniece Ree Cardiologist - Dr. Corky Downs  Chest x-ray -11/02/18 EKG - 04/12/18 Stress Test - no ECHO - 10/10/18 Cardiac Cath -   Sleep Study - yes CPAP - yes but doesn't use it anymore because he rips it off at night  Fasting Blood Sugar - NA Checks Blood Sugar _____ times a day  Blood Thinner Instructions:ASA Aspirin Instructions:none given by MD.  Pt will stop today Last Dose:12/29/18  Anesthesia review:   Patient denies shortness of breath, fever, cough and chest pain at PAT appointment  yes Patient verbalized understanding of instructions that were given to them at the PAT appointment. Patient was also instructed that they will need to review over the PAT instructions again at home before surgery. yes

## 2018-12-30 LAB — NOVEL CORONAVIRUS, NAA (HOSP ORDER, SEND-OUT TO REF LAB; TAT 18-24 HRS): SARS-CoV-2, NAA: NOT DETECTED

## 2018-12-30 NOTE — Progress Notes (Signed)
Anesthesia Chart Review   Case: [name] Date/Time: 01/02/19 1350   Procedure: LAPAROSCOPIC CHOLECYSTECTOMY (N/A )   Anesthesia type: General   Pre-op diagnosis: CHOLECYSTITIS   Location: [facility] ROOM 01 / [facility]   Surgeons: Clovis Riley, MD      DISCUSSION:78 y.o. former smoker (2.5 pack years, quit 03/30/76) with h/o HTN, GERD, HLD, COPD, CAD (CABG 2001), sleep apnea w/o device, cholecystitis scheduled for above procedure 01/02/2019 with Dr. Romana Juniper.   Cleared by cardiology 12/12/2018.  Per [name], NP, "[name] was last seen on 10/10/2018 by Dr. Claiborne Billings.  Since that day, BOEDY SADRI has done well from a cardiac standpoint. He has a low risk Myoview in 11/2017 and normal LVEF by echo with grade 1 diastolic dysfunction at that time.  He has had issues with shortness of breath but today states that he is "doing absolutely great" with no chest discomfort or shortness of breath. He says that his shortness of breath is much better since seeing a pulmonologist.  He is able to climb a flight of stairs and walk a distance on flat ground but has difficulty going up hills.  His limitation is mostly due to leg pain and not breathing.  According to the RCRI, ABDOU DOTTERY is a class II risk with 0.9% risk of major cardiac event perioperatively.  Therefore, based on ACC/AHA guidelines, the patient would be at acceptable risk for the planned procedure without further cardiovascular testing.   There is no request to hold aspirin for the surgery.  We recommend continuing aspirin 81 mg through procedures if possible, however, this can be held if deemed necessary."  Anticipate pt can proceed with planned procedure barring acute status change.   VS: BP (!) 145/66   Pulse 66   Temp 36.8 C (Oral)   Resp 18   Ht 5\' 9"  (1.753 m)   Wt 80.7 kg   SpO2 95%   BMI 26.29 kg/m   PROVIDERS: Isaac Bliss, Rayford Halsted, MD is PCP   [name], MD is Cardiologist  LABS: Labs  reviewed: Acceptable for surgery. (all labs ordered are listed, but only abnormal results are displayed)  Labs Reviewed  CBC WITH DIFFERENTIAL/PLATELET - Abnormal; Notable for the following components:      Result Value   Hemoglobin 12.4 (*)    All other components within normal limits  COMPREHENSIVE METABOLIC PANEL - Abnormal; Notable for the following components:   Glucose, Bld 162 (*)    Creatinine, Ser 1.40 (*)    Calcium 8.8 (*)    GFR calc non Af Amer 48 (*)    GFR calc Af Amer 56 (*)    All other components within normal limits     IMAGES:   EKG: 04/12/2018 71 bpm  Normal sinus rhythm   CV: Myocardial Perfusion Imaging 01/04/2018  The left ventricular ejection fraction is mildly decreased (45-54%).  Nuclear stress EF: 51%.  This is a low risk study.   Normal resting and stress perfusion. No ischemia or infarction EF Estimated at 51% apex appears hypokinetic RV appears prominent   Echo 12/23/2017 Study Conclusions  - Left ventricle: The cavity size was normal. There was mild   concentric hypertrophy. Systolic function was normal. The   estimated ejection fraction was in the range of 60% to 65%. Wall   motion was normal; there were no regional wall motion   abnormalities. Doppler parameters are consistent with abnormal   left ventricular relaxation (grade 1  diastolic dysfunction). - Aortic valve: Trileaflet; mildly thickened, mildly calcified   leaflets. There was trivial regurgitation. - Mitral valve: There was no significant regurgitation. - Left atrium: The atrium was mildly dilated. - Tricuspid valve: There was no significant regurgitation. - Pulmonic valve: There was no significant regurgitation. - Inferior vena cava: The vessel was normal in size. The   respirophasic diameter changes were in the normal range (= 50%),   consistent with normal central venous pressure.  Impressions:  - Normal LV EF, no significant valve disease. Past Medical  History:  Diagnosis Date  . Allergy   . B12 deficiency anemia   . Blood transfusion without reported diagnosis   . CAD (coronary artery disease)   . Cancer ([place])    bladder-   . Colon polyps   . COPD (chronic obstructive pulmonary disease) ([place])   . Depression   . Esophagus, Barrett's   . GERD (gastroesophageal reflux disease)   . History of bladder cancer    Bladder cancer "8 times"  . History of hiatal hernia   . Hyperlipidemia   . Hypertension   . Localized osteoarthrosis, lower leg   . Restless leg syndrome   . Sleep apnea    does not wear cpap  . Stenosis of esophagus     Past Surgical History:  Procedure Laterality Date  . BILIARY BRUSHING  04/01/2018   Procedure: BILIARY BRUSHING;  Surgeon: Rush Landmark Telford Nab., MD;  Location: [place];  Service: Gastroenterology;;  . BILIARY BRUSHING  09/12/2018   Procedure: BILIARY BRUSHING;  Surgeon: Irving Copas., MD;  Location: [place];  Service: Gastroenterology;;  . BILIARY BRUSHING  11/28/2018   Procedure: BILIARY BRUSHING;  Surgeon: Irving Copas., MD;  Location: [place];  Service: Gastroenterology;;  . BILIARY DILATION  09/12/2018   Procedure: BILIARY DILATION;  Surgeon: Irving Copas., MD;  Location: [place];  Service: Gastroenterology;;  . BILIARY DILATION  11/28/2018   Procedure: BILIARY DILATION;  Surgeon: Irving Copas., MD;  Location: [place];  Service: Gastroenterology;;  . BILIARY STENT PLACEMENT  04/01/2018   Procedure: BILIARY STENT PLACEMENT;  Surgeon: Irving Copas., MD;  Location: [place];  Service: Gastroenterology;;  . BILIARY STENT PLACEMENT  09/12/2018   Procedure: BILIARY STENT PLACEMENT;  Surgeon: Irving Copas., MD;  Location: [place];  Service: Gastroenterology;;  . BILIARY STENT PLACEMENT  11/28/2018   Procedure: BILIARY STENT PLACEMENT;  Surgeon: Irving Copas., MD;  Location: [place];  Service:  Gastroenterology;;  . BIOPSY  04/01/2018   Procedure: BIOPSY;  Surgeon: Irving Copas., MD;  Location: [place];  Service: Gastroenterology;;  . BIOPSY  09/12/2018   Procedure: BIOPSY;  Surgeon: Irving Copas., MD;  Location: [place];  Service: Gastroenterology;;  . BIOPSY  11/28/2018   Procedure: BIOPSY;  Surgeon: Irving Copas., MD;  Location: [facility] ENDOSCOPY;  Service: Gastroenterology;;  . bladder cancer      x 8 cystoscopy  . CERVICAL DISCECTOMY     ACDF  . COLONOSCOPY  11/17/2005   normal   . CORONARY ARTERY BYPASS GRAFT     x4  . ENDOSCOPIC MUCOSAL RESECTION  09/12/2018   Procedure: ENDOSCOPIC MUCOSAL RESECTION;  Surgeon: Rush Landmark Telford Nab., MD;  Location: [facility] ENDOSCOPY;  Service: Gastroenterology;;  . ENDOSCOPIC RETROGRADE CHOLANGIOPANCREATOGRAPHY (ERCP) WITH PROPOFOL N/A 04/01/2018   Procedure: ENDOSCOPIC RETROGRADE CHOLANGIOPANCREATOGRAPHY (ERCP) WITH PROPOFOL;  Surgeon: Irving Copas., MD;  Location: [place];  Service: Gastroenterology;  Laterality: N/A;  . ENDOSCOPIC  RETROGRADE CHOLANGIOPANCREATOGRAPHY (ERCP) WITH PROPOFOL N/A 09/12/2018   Procedure: ENDOSCOPIC RETROGRADE CHOLANGIOPANCREATOGRAPHY (ERCP) WITH PROPOFOL;  Surgeon: Rush Landmark Telford Nab., MD;  Location: [place];  Service: Gastroenterology;  Laterality: N/A;  . ERCP N/A 11/28/2018   Procedure: ENDOSCOPIC RETROGRADE CHOLANGIOPANCREATOGRAPHY (ERCP) +EGD with spyglass;  Surgeon: Rush Landmark Telford Nab., MD;  Location: [place];  Service: Gastroenterology;  Laterality: N/A;  . ESOPHAGOGASTRODUODENOSCOPY  04/29/2010  . ESOPHAGOGASTRODUODENOSCOPY (EGD) WITH PROPOFOL N/A 04/01/2018   Procedure: ESOPHAGOGASTRODUODENOSCOPY (EGD) WITH PROPOFOL;  Surgeon: Rush Landmark Telford Nab., MD;  Location: [place];  Service: Gastroenterology;  Laterality: N/A;  . ESOPHAGOGASTRODUODENOSCOPY (EGD) WITH PROPOFOL N/A 09/12/2018   Procedure: ESOPHAGOGASTRODUODENOSCOPY (EGD) WITH PROPOFOL;   Surgeon: Rush Landmark Telford Nab., MD;  Location: [place];  Service: Gastroenterology;  Laterality: N/A;  . ESOPHAGOGASTRODUODENOSCOPY (EGD) WITH PROPOFOL N/A 11/28/2018   Procedure: ESOPHAGOGASTRODUODENOSCOPY (EGD) WITH PROPOFOL;  Surgeon: Rush Landmark Telford Nab., MD;  Location: [place];  Service: Gastroenterology;  Laterality: N/A;  . EUS  04/01/2018   Procedure: FULL UPPER ENDOSCOPIC ULTRASOUND (EUS) RADIAL;  Surgeon: Irving Copas., MD;  Location: [place];  Service: Gastroenterology;;  . EUS N/A 09/12/2018   Procedure: UPPER ENDOSCOPIC ULTRASOUND (EUS) RADIAL;  Surgeon: Irving Copas., MD;  Location: [place];  Service: Gastroenterology;  Laterality: N/A;  . FINE NEEDLE ASPIRATION  09/12/2018   Procedure: FINE NEEDLE ASPIRATION (FNA) LINEAR;  Surgeon: Irving Copas., MD;  Location: [place];  Service: Gastroenterology;;  . HEMOSTASIS CLIP PLACEMENT  09/12/2018   Procedure: HEMOSTASIS CLIP PLACEMENT;  Surgeon: Irving Copas., MD;  Location: [facility] ENDOSCOPY;  Service: Gastroenterology;;  . I&D EXTREMITY Left 11/24/2016   Procedure: IRRIGATION AND DEBRIDEMENT LEFT HAND, THUMB, INDEX, MIDDLE, RING, AND SMALL FINGERS WITH RECONSTRUCTION;  Surgeon: Roseanne Kaufman, MD;  Location: [place];  Service: Orthopedics;  Laterality: Left;  . KNEE ARTHROSCOPY Left   . LUMBAR LAMINECTOMY     and fusion x 2  . NASAL SINUS SURGERY    . POPLITEAL SYNOVIAL CYST EXCISION    . REMOVAL OF STONES  04/01/2018   Procedure: REMOVAL OF STONES;  Surgeon: Rush Landmark Telford Nab., MD;  Location: [place];  Service: Gastroenterology;;  . REMOVAL OF STONES  09/12/2018   Procedure: REMOVAL OF STONES;  Surgeon: Irving Copas., MD;  Location: [place];  Service: Gastroenterology;;  . REMOVAL OF STONES  11/28/2018   Procedure: REMOVAL OF STONES;  Surgeon: Irving Copas., MD;  Location: [place];  Service: Gastroenterology;;  . Azzie Almas DILATION N/A  09/12/2018   Procedure: Azzie Almas DILATION;  Surgeon: Irving Copas., MD;  Location: [place];  Service: Gastroenterology;  Laterality: N/A;  . SAVORY DILATION N/A 11/28/2018   Procedure: SAVORY DILATION;  Surgeon: Rush Landmark Telford Nab., MD;  Location: [place];  Service: Gastroenterology;  Laterality: N/A;  . SPHINCTEROTOMY  04/01/2018   Procedure: SPHINCTEROTOMY;  Surgeon: Rush Landmark Telford Nab., MD;  Location: [place];  Service: Gastroenterology;;  . Bess Kinds CHOLANGIOSCOPY N/A 11/28/2018   Procedure: [id number] CHOLANGIOSCOPY;  Surgeon: Irving Copas., MD;  Location: [place];  Service: Gastroenterology;  Laterality: N/A;  . STENT REMOVAL  09/12/2018   Procedure: STENT REMOVAL;  Surgeon: Irving Copas., MD;  Location: [place];  Service: Gastroenterology;;  . Lavell Islam REMOVAL  11/28/2018   Procedure: STENT REMOVAL;  Surgeon: Irving Copas., MD;  Location: [place];  Service: Gastroenterology;;  . Lia Foyer LIFTING INJECTION  09/12/2018   Procedure: SUBMUCOSAL LIFTING INJECTION;  Surgeon: Irving Copas., MD;  Location: [place];  Service: Gastroenterology;;  MEDICATIONS: . acetaminophen (TYLENOL) 325 MG tablet  . amLODipine (NORVASC) 2.5 MG tablet  . aspirin EC 81 MG tablet  . BYSTOLIC 10 MG tablet  . cyanocobalamin (,VITAMIN B-12,) 1000 MCG/ML injection  . dicyclomine (BENTYL) 10 MG capsule  . ezetimibe (ZETIA) 10 MG tablet  . Fiber Complete TABS  . fluticasone (FLONASE) 50 MCG/ACT nasal spray  . folic acid (FOLVITE) A999333 MCG tablet  . isosorbide mononitrate (IMDUR) 60 MG 24 hr tablet  . Menthol, Topical Analgesic, (BENGAY EX)  . Multiple Vitamins-Minerals (MULTIVITAMIN WITH MINERALS) tablet  . Naphazoline-Pheniramine (OPCON-A) 0.027-0.315 % SOLN  . NEEDLE, DISP, 25 G (B-D DISP NEEDLE 25GX1") 25G X 1" MISC  . nitroGLYCERIN (NITROSTAT) 0.4 MG SL tablet  . oxyCODONE-acetaminophen (PERCOCET) 10-325 MG tablet  .  pantoprazole (PROTONIX) 40 MG tablet  . Probiotic Product (PROBIOTIC ADVANCED PO)  . rOPINIRole (REQUIP) 2 MG tablet  . rosuvastatin (CRESTOR) 20 MG tablet  . venlafaxine (EFFEXOR) 75 MG tablet   No current facility-administered medications for this encounter.   Maia Plan [facility] Pre-Surgical Testing [phone] 12/30/18  11:57 AM

## 2018-12-30 NOTE — Anesthesia Preprocedure Evaluation (Addendum)
Anesthesia Evaluation  Patient identified by MRN, date of birth, ID band Patient awake    Reviewed: Allergy & Precautions, H&P , NPO status , Patient's Chart, lab work & pertinent test results  Airway Mallampati: II   Neck ROM: full    Dental   Pulmonary sleep apnea , COPD, former smoker,    breath sounds clear to auscultation       Cardiovascular hypertension, + CAD and + CABG   Rhythm:regular Rate:Normal     Neuro/Psych PSYCHIATRIC DISORDERS Depression  Neuromuscular disease    GI/Hepatic hiatal hernia, GERD  ,  Endo/Other  diabetes, Type 2Hypothyroidism   Renal/GU Renal InsufficiencyRenal disease     Musculoskeletal  (+) Arthritis ,   Abdominal   Peds  Hematology   Anesthesia Other Findings   Reproductive/Obstetrics                            Anesthesia Physical Anesthesia Plan  ASA: III  Anesthesia Plan: General   Post-op Pain Management:    Induction: Intravenous  PONV Risk Score and Plan: 2 and Ondansetron, Dexamethasone and Treatment may vary due to age or medical condition  Airway Management Planned: Oral ETT  Additional Equipment:   Intra-op Plan:   Post-operative Plan: Extubation in OR  Informed Consent: I have reviewed the patients History and Physical, chart, labs and discussed the procedure including the risks, benefits and alternatives for the proposed anesthesia with the patient or authorized representative who has indicated his/her understanding and acceptance.       Plan Discussed with: CRNA, Anesthesiologist and Surgeon  Anesthesia Plan Comments: (See PAT note 12/29/2018, Konrad Felix, PA-C)       Anesthesia Quick Evaluation

## 2019-01-01 MED ORDER — BUPIVACAINE LIPOSOME 1.3 % IJ SUSP
20.0000 mL | Freq: Once | INTRAMUSCULAR | Status: DC
  Filled 2019-01-01: qty 20

## 2019-01-02 ENCOUNTER — Encounter (HOSPITAL_COMMUNITY)
Admission: RE | Disposition: A | Discharge status: Home / Self Care | Payer: Self-pay | Source: Home / Self Care | Attending: Surgery

## 2019-01-02 ENCOUNTER — Telehealth (HOSPITAL_COMMUNITY): Payer: Self-pay | Admitting: *Deleted

## 2019-01-02 ENCOUNTER — Ambulatory Visit (HOSPITAL_COMMUNITY): Payer: Medicare Other | Admitting: Certified Registered Nurse Anesthetist

## 2019-01-02 ENCOUNTER — Ambulatory Visit (HOSPITAL_COMMUNITY)
Admission: RE | Admit: 2019-01-02 | Discharge: 2019-01-02 | Disposition: A | Discharge status: Home / Self Care | Payer: Medicare Other | Source: Home / Self Care | Attending: Surgery | Admitting: Surgery

## 2019-01-02 ENCOUNTER — Other Ambulatory Visit: Payer: Self-pay

## 2019-01-02 ENCOUNTER — Ambulatory Visit (HOSPITAL_COMMUNITY): Payer: Medicare Other | Admitting: Physician Assistant

## 2019-01-02 ENCOUNTER — Encounter (HOSPITAL_COMMUNITY): Payer: Self-pay | Admitting: Surgery

## 2019-01-02 DIAGNOSIS — E1122 Type 2 diabetes mellitus with diabetic chronic kidney disease: Secondary | ICD-10-CM | POA: Diagnosis not present

## 2019-01-02 DIAGNOSIS — I25810 Atherosclerosis of coronary artery bypass graft(s) without angina pectoris: Secondary | ICD-10-CM | POA: Diagnosis not present

## 2019-01-02 DIAGNOSIS — Z91048 Other nonmedicinal substance allergy status: Secondary | ICD-10-CM | POA: Insufficient documentation

## 2019-01-02 DIAGNOSIS — Z20828 Contact with and (suspected) exposure to other viral communicable diseases: Secondary | ICD-10-CM | POA: Diagnosis not present

## 2019-01-02 DIAGNOSIS — Z823 Family history of stroke: Secondary | ICD-10-CM | POA: Insufficient documentation

## 2019-01-02 DIAGNOSIS — R6520 Severe sepsis without septic shock: Secondary | ICD-10-CM | POA: Diagnosis not present

## 2019-01-02 DIAGNOSIS — I130 Hypertensive heart and chronic kidney disease with heart failure and stage 1 through stage 4 chronic kidney disease, or unspecified chronic kidney disease: Secondary | ICD-10-CM | POA: Diagnosis not present

## 2019-01-02 DIAGNOSIS — K811 Chronic cholecystitis: Secondary | ICD-10-CM | POA: Diagnosis not present

## 2019-01-02 DIAGNOSIS — E785 Hyperlipidemia, unspecified: Secondary | ICD-10-CM | POA: Insufficient documentation

## 2019-01-02 DIAGNOSIS — D519 Vitamin B12 deficiency anemia, unspecified: Secondary | ICD-10-CM | POA: Diagnosis not present

## 2019-01-02 DIAGNOSIS — Z951 Presence of aortocoronary bypass graft: Secondary | ICD-10-CM | POA: Insufficient documentation

## 2019-01-02 DIAGNOSIS — Z8551 Personal history of malignant neoplasm of bladder: Secondary | ICD-10-CM | POA: Insufficient documentation

## 2019-01-02 DIAGNOSIS — J449 Chronic obstructive pulmonary disease, unspecified: Secondary | ICD-10-CM | POA: Insufficient documentation

## 2019-01-02 DIAGNOSIS — I4519 Other right bundle-branch block: Secondary | ICD-10-CM | POA: Diagnosis not present

## 2019-01-02 DIAGNOSIS — I214 Non-ST elevation (NSTEMI) myocardial infarction: Secondary | ICD-10-CM | POA: Diagnosis not present

## 2019-01-02 DIAGNOSIS — J440 Chronic obstructive pulmonary disease with acute lower respiratory infection: Secondary | ICD-10-CM | POA: Diagnosis not present

## 2019-01-02 DIAGNOSIS — K8010 Calculus of gallbladder with chronic cholecystitis without obstruction: Secondary | ICD-10-CM | POA: Insufficient documentation

## 2019-01-02 DIAGNOSIS — N289 Disorder of kidney and ureter, unspecified: Secondary | ICD-10-CM | POA: Insufficient documentation

## 2019-01-02 DIAGNOSIS — J9601 Acute respiratory failure with hypoxia: Secondary | ICD-10-CM | POA: Diagnosis not present

## 2019-01-02 DIAGNOSIS — I2510 Atherosclerotic heart disease of native coronary artery without angina pectoris: Secondary | ICD-10-CM | POA: Insufficient documentation

## 2019-01-02 DIAGNOSIS — A419 Sepsis, unspecified organism: Secondary | ICD-10-CM | POA: Diagnosis not present

## 2019-01-02 DIAGNOSIS — Z808 Family history of malignant neoplasm of other organs or systems: Secondary | ICD-10-CM | POA: Insufficient documentation

## 2019-01-02 DIAGNOSIS — G709 Myoneural disorder, unspecified: Secondary | ICD-10-CM | POA: Insufficient documentation

## 2019-01-02 DIAGNOSIS — K449 Diaphragmatic hernia without obstruction or gangrene: Secondary | ICD-10-CM | POA: Insufficient documentation

## 2019-01-02 DIAGNOSIS — G4730 Sleep apnea, unspecified: Secondary | ICD-10-CM | POA: Insufficient documentation

## 2019-01-02 DIAGNOSIS — Z87891 Personal history of nicotine dependence: Secondary | ICD-10-CM | POA: Insufficient documentation

## 2019-01-02 DIAGNOSIS — Z888 Allergy status to other drugs, medicaments and biological substances status: Secondary | ICD-10-CM | POA: Insufficient documentation

## 2019-01-02 DIAGNOSIS — I351 Nonrheumatic aortic (valve) insufficiency: Secondary | ICD-10-CM | POA: Diagnosis not present

## 2019-01-02 DIAGNOSIS — R0602 Shortness of breath: Secondary | ICD-10-CM | POA: Diagnosis not present

## 2019-01-02 DIAGNOSIS — E039 Hypothyroidism, unspecified: Secondary | ICD-10-CM | POA: Diagnosis not present

## 2019-01-02 DIAGNOSIS — J309 Allergic rhinitis, unspecified: Secondary | ICD-10-CM | POA: Diagnosis not present

## 2019-01-02 DIAGNOSIS — F329 Major depressive disorder, single episode, unspecified: Secondary | ICD-10-CM | POA: Insufficient documentation

## 2019-01-02 DIAGNOSIS — M1712 Unilateral primary osteoarthritis, left knee: Secondary | ICD-10-CM | POA: Diagnosis not present

## 2019-01-02 DIAGNOSIS — K219 Gastro-esophageal reflux disease without esophagitis: Secondary | ICD-10-CM | POA: Insufficient documentation

## 2019-01-02 DIAGNOSIS — G2581 Restless legs syndrome: Secondary | ICD-10-CM | POA: Diagnosis not present

## 2019-01-02 DIAGNOSIS — R7989 Other specified abnormal findings of blood chemistry: Secondary | ICD-10-CM | POA: Diagnosis not present

## 2019-01-02 DIAGNOSIS — Z87442 Personal history of urinary calculi: Secondary | ICD-10-CM | POA: Insufficient documentation

## 2019-01-02 DIAGNOSIS — G4733 Obstructive sleep apnea (adult) (pediatric): Secondary | ICD-10-CM | POA: Diagnosis not present

## 2019-01-02 DIAGNOSIS — N1832 Chronic kidney disease, stage 3b: Secondary | ICD-10-CM | POA: Diagnosis not present

## 2019-01-02 DIAGNOSIS — E119 Type 2 diabetes mellitus without complications: Secondary | ICD-10-CM | POA: Insufficient documentation

## 2019-01-02 DIAGNOSIS — I5043 Acute on chronic combined systolic (congestive) and diastolic (congestive) heart failure: Secondary | ICD-10-CM | POA: Diagnosis not present

## 2019-01-02 DIAGNOSIS — I10 Essential (primary) hypertension: Secondary | ICD-10-CM | POA: Insufficient documentation

## 2019-01-02 DIAGNOSIS — J189 Pneumonia, unspecified organism: Secondary | ICD-10-CM | POA: Diagnosis not present

## 2019-01-02 DIAGNOSIS — Z8249 Family history of ischemic heart disease and other diseases of the circulatory system: Secondary | ICD-10-CM | POA: Insufficient documentation

## 2019-01-02 DIAGNOSIS — M1990 Unspecified osteoarthritis, unspecified site: Secondary | ICD-10-CM | POA: Insufficient documentation

## 2019-01-02 SURGERY — CHOLECYSTECTOMY, ROBOT-ASSISTED, LAPAROSCOPIC
Anesthesia: General

## 2019-01-02 MED ORDER — ROCURONIUM BROMIDE 10 MG/ML (PF) SYRINGE
PREFILLED_SYRINGE | INTRAVENOUS | Status: AC
  Filled 2019-01-02: qty 10

## 2019-01-02 MED ORDER — SODIUM CHLORIDE 0.9% FLUSH: 3.0000 mL | Freq: Two times a day (BID) | INTRAVENOUS | Status: DC

## 2019-01-02 MED ORDER — FENTANYL CITRATE (PF) 100 MCG/2ML IJ SOLN: 25.0000 ug | INTRAMUSCULAR | Status: DC | PRN

## 2019-01-02 MED ORDER — SUCCINYLCHOLINE CHLORIDE 200 MG/10ML IV SOSY
PREFILLED_SYRINGE | INTRAVENOUS | Status: AC
  Filled 2019-01-02: qty 10

## 2019-01-02 MED ORDER — PHENYLEPHRINE HCL-NACL 10-0.9 MG/250ML-% IV SOLN
INTRAVENOUS | Status: DC | PRN
  Administered 2019-01-02: 35 ug/min via INTRAVENOUS

## 2019-01-02 MED ORDER — ONDANSETRON HCL 4 MG/2ML IJ SOLN: 4.0000 mg | Freq: Four times a day (QID) | INTRAMUSCULAR | Status: DC | PRN

## 2019-01-02 MED ORDER — PROPOFOL 10 MG/ML IV BOLUS
INTRAVENOUS | Status: DC | PRN
  Administered 2019-01-02: 130 mg via INTRAVENOUS

## 2019-01-02 MED ORDER — OXYCODONE HCL 5 MG/5ML PO SOLN: 5.0000 mg | Freq: Once | ORAL | Status: DC | PRN

## 2019-01-02 MED ORDER — OXYCODONE HCL 5 MG PO TABS: 5.0000 mg | ORAL_TABLET | ORAL | Status: DC | PRN

## 2019-01-02 MED ORDER — BUPIVACAINE HCL 0.25 % IJ SOLN
INTRAMUSCULAR | Status: DC | PRN
  Administered 2019-01-02: 30 mL

## 2019-01-02 MED ORDER — INDOCYANINE GREEN 25 MG IV SOLR
2.5000 mg | Freq: Once | INTRAVENOUS | Status: AC
  Administered 2019-01-02: 2.5 mg via INTRAVENOUS
  Filled 2019-01-02: qty 2.5
  Filled 2019-01-02: qty 25

## 2019-01-02 MED ORDER — DEXAMETHASONE SODIUM PHOSPHATE 10 MG/ML IJ SOLN
INTRAMUSCULAR | Status: AC
  Filled 2019-01-02: qty 1

## 2019-01-02 MED ORDER — LIDOCAINE 2% (20 MG/ML) 5 ML SYRINGE
INTRAMUSCULAR | Status: DC | PRN
  Administered 2019-01-02: 60 mg via INTRAVENOUS

## 2019-01-02 MED ORDER — ACETAMINOPHEN 500 MG PO TABS
1000.0000 mg | ORAL_TABLET | ORAL | Status: AC
  Administered 2019-01-02: 1000 mg via ORAL
  Filled 2019-01-02: qty 2

## 2019-01-02 MED ORDER — 0.9 % SODIUM CHLORIDE (POUR BTL) OPTIME
TOPICAL | Status: DC | PRN
  Administered 2019-01-02: 1000 mL

## 2019-01-02 MED ORDER — FENTANYL CITRATE (PF) 100 MCG/2ML IJ SOLN
INTRAMUSCULAR | Status: DC | PRN
  Administered 2019-01-02: 50 ug via INTRAVENOUS
  Administered 2019-01-02: 100 ug via INTRAVENOUS

## 2019-01-02 MED ORDER — SODIUM CHLORIDE 0.9% FLUSH: 3.0000 mL | INTRAVENOUS | Status: DC | PRN

## 2019-01-02 MED ORDER — BUPIVACAINE LIPOSOME 1.3 % IJ SUSP
INTRAMUSCULAR | Status: DC | PRN
  Administered 2019-01-02: 20 mL

## 2019-01-02 MED ORDER — BUPIVACAINE HCL (PF) 0.25 % IJ SOLN
INTRAMUSCULAR | Status: AC
  Filled 2019-01-02: qty 30

## 2019-01-02 MED ORDER — CHLORHEXIDINE GLUCONATE 4 % EX LIQD: 60.0000 mL | Freq: Once | CUTANEOUS | Status: DC

## 2019-01-02 MED ORDER — SUGAMMADEX SODIUM 200 MG/2ML IV SOLN
INTRAVENOUS | Status: DC | PRN
  Administered 2019-01-02: 200 mg via INTRAVENOUS

## 2019-01-02 MED ORDER — OXYCODONE HCL 5 MG PO TABS: 5.0000 mg | ORAL_TABLET | Freq: Once | ORAL | Status: DC | PRN

## 2019-01-02 MED ORDER — ROCURONIUM BROMIDE 50 MG/5ML IV SOSY
PREFILLED_SYRINGE | INTRAVENOUS | Status: DC | PRN
  Administered 2019-01-02: 20 mg via INTRAVENOUS
  Administered 2019-01-02: 50 mg via INTRAVENOUS

## 2019-01-02 MED ORDER — CEFAZOLIN SODIUM-DEXTROSE 2-4 GM/100ML-% IV SOLN
2.0000 g | INTRAVENOUS | Status: AC
  Administered 2019-01-02: 2 g via INTRAVENOUS
  Filled 2019-01-02: qty 100

## 2019-01-02 MED ORDER — LIDOCAINE 2% (20 MG/ML) 5 ML SYRINGE
INTRAMUSCULAR | Status: AC
  Filled 2019-01-02: qty 5

## 2019-01-02 MED ORDER — ACETAMINOPHEN 650 MG RE SUPP
650.0000 mg | RECTAL | Status: DC | PRN
  Filled 2019-01-02: qty 1

## 2019-01-02 MED ORDER — FENTANYL CITRATE (PF) 250 MCG/5ML IJ SOLN
INTRAMUSCULAR | Status: AC
  Filled 2019-01-02: qty 5

## 2019-01-02 MED ORDER — PROPOFOL 10 MG/ML IV BOLUS
INTRAVENOUS | Status: AC
  Filled 2019-01-02: qty 20

## 2019-01-02 MED ORDER — DOCUSATE SODIUM 100 MG PO CAPS: 100.0000 mg | ORAL_CAPSULE | Freq: Two times a day (BID) | ORAL | 0 refills | Status: AC

## 2019-01-02 MED ORDER — LACTATED RINGERS IV SOLN
INTRAVENOUS | Status: DC | PRN
  Administered 2019-01-02: 1 via INTRAVENOUS

## 2019-01-02 MED ORDER — SODIUM CHLORIDE 0.9 % IV SOLN: 250.0000 mL | INTRAVENOUS | Status: DC | PRN

## 2019-01-02 MED ORDER — FENTANYL CITRATE (PF) 100 MCG/2ML IJ SOLN
INTRAMUSCULAR | Status: AC
  Filled 2019-01-02: qty 4

## 2019-01-02 MED ORDER — ACETAMINOPHEN 325 MG PO TABS: 650.0000 mg | ORAL_TABLET | ORAL | Status: DC | PRN

## 2019-01-02 MED ORDER — ONDANSETRON HCL 4 MG/2ML IJ SOLN
INTRAMUSCULAR | Status: DC | PRN
  Administered 2019-01-02: 4 mg via INTRAVENOUS

## 2019-01-02 MED ORDER — OMEPRAZOLE 40 MG PO CPDR: 40.0000 mg | DELAYED_RELEASE_CAPSULE | Freq: Two times a day (BID) | ORAL | 3 refills | Status: DC

## 2019-01-02 MED ORDER — LACTATED RINGERS IV SOLN
INTRAVENOUS | Status: DC
  Administered 2019-01-02 (×2): via INTRAVENOUS

## 2019-01-02 MED ORDER — FENTANYL CITRATE (PF) 100 MCG/2ML IJ SOLN
25.0000 ug | INTRAMUSCULAR | Status: DC | PRN
  Administered 2019-01-02 (×3): 50 ug via INTRAVENOUS

## 2019-01-02 MED ORDER — DEXAMETHASONE SODIUM PHOSPHATE 10 MG/ML IJ SOLN
INTRAMUSCULAR | Status: DC | PRN
  Administered 2019-01-02: 10 mg via INTRAVENOUS

## 2019-01-02 MED ORDER — ONDANSETRON HCL 4 MG/2ML IJ SOLN
INTRAMUSCULAR | Status: AC
  Filled 2019-01-02: qty 2

## 2019-01-02 MED ORDER — TRAMADOL HCL 50 MG PO TABS: 50.0000 mg | ORAL_TABLET | Freq: Four times a day (QID) | ORAL | 0 refills | Status: DC | PRN

## 2019-01-02 SURGICAL SUPPLY — 53 items
APPLIER CLIP 5 13 M/L LIGAMAX5 (MISCELLANEOUS)
BLADE SURG SZ11 CARB STEEL (BLADE) ×2 IMPLANT
CHLORAPREP W/TINT 26 (MISCELLANEOUS) ×2 IMPLANT
CLIP APPLIE 5 13 M/L LIGAMAX5 (MISCELLANEOUS) IMPLANT
CLIP VESOLOCK LG 6/CT PURPLE (CLIP) IMPLANT
COVER TIP SHEARS 8 DVNC (MISCELLANEOUS) ×1 IMPLANT
COVER TIP SHEARS 8MM DA VINCI (MISCELLANEOUS) ×1
COVER WAND RF STERILE (DRAPES) ×2 IMPLANT
DECANTER SPIKE VIAL GLASS SM (MISCELLANEOUS) ×2 IMPLANT
DERMABOND ADVANCED (GAUZE/BANDAGES/DRESSINGS) ×1
DERMABOND ADVANCED .7 DNX12 (GAUZE/BANDAGES/DRESSINGS) ×1 IMPLANT
DRAPE ARM DVNC X/XI (DISPOSABLE) ×4 IMPLANT
DRAPE COLUMN DVNC XI (DISPOSABLE) ×1 IMPLANT
DRAPE DA VINCI XI ARM (DISPOSABLE) ×4
DRAPE DA VINCI XI COLUMN (DISPOSABLE) ×1
ELECT REM PT RETURN 15FT ADLT (MISCELLANEOUS) ×2 IMPLANT
GAUZE 4X4 16PLY RFD (DISPOSABLE) ×2 IMPLANT
GAUZE SPONGE 2X2 8PLY STRL LF (GAUZE/BANDAGES/DRESSINGS) IMPLANT
GLOVE BIO SURGEON STRL SZ 6 (GLOVE) ×4 IMPLANT
GLOVE INDICATOR 6.5 STRL GRN (GLOVE) ×4 IMPLANT
GOWN STRL REUS W/TWL LRG LVL3 (GOWN DISPOSABLE) ×4 IMPLANT
GOWN STRL REUS W/TWL XL LVL3 (GOWN DISPOSABLE) IMPLANT
GRASPER SUT TROCAR 14GX15 (MISCELLANEOUS) IMPLANT
IRRIGATOR SUCT 8 DISP DVNC XI (IRRIGATION / IRRIGATOR) IMPLANT
IRRIGATOR SUCTION 8MM XI DISP (IRRIGATION / IRRIGATOR)
KIT BASIN OR (CUSTOM PROCEDURE TRAY) ×2 IMPLANT
KIT PROCEDURE DA VINCI SI (MISCELLANEOUS)
KIT PROCEDURE DVNC SI (MISCELLANEOUS) IMPLANT
KIT TURNOVER KIT A (KITS) IMPLANT
MANIFOLD NEPTUNE II (INSTRUMENTS) ×2 IMPLANT
NEEDLE HYPO 22GX1.5 SAFETY (NEEDLE) ×2 IMPLANT
NEEDLE INSUFFLATION 14GA 120MM (NEEDLE) ×2 IMPLANT
PACK CARDIOVASCULAR III (CUSTOM PROCEDURE TRAY) ×2 IMPLANT
PENCIL SMOKE EVACUATOR (MISCELLANEOUS) IMPLANT
SEAL CANN UNIV 5-8 DVNC XI (MISCELLANEOUS) ×4 IMPLANT
SEAL XI 5MM-8MM UNIVERSAL (MISCELLANEOUS) ×4
SEALER VESSEL DA VINCI XI (MISCELLANEOUS)
SEALER VESSEL EXT DVNC XI (MISCELLANEOUS) IMPLANT
SET IRRIG TUBING LAPAROSCOPIC (IRRIGATION / IRRIGATOR) ×2 IMPLANT
SOL ANTI FOG 6CC (MISCELLANEOUS) ×1 IMPLANT
SOLUTION ANTI FOG 6CC (MISCELLANEOUS) ×1
SOLUTION ELECTROLUBE (MISCELLANEOUS) ×2 IMPLANT
SPONGE GAUZE 2X2 STER 10/PKG (GAUZE/BANDAGES/DRESSINGS)
SPONGE LAP 18X18 RF (DISPOSABLE) ×2 IMPLANT
SUT MNCRL AB 4-0 PS2 18 (SUTURE) ×2 IMPLANT
SYR CONTROL 10ML LL (SYRINGE) ×2 IMPLANT
SYS RETRIEVAL 5MM INZII UNIV (BASKET) ×2
SYSTEM RETRIEVL 5MM INZII UNIV (BASKET) ×1 IMPLANT
TOWEL OR 17X26 10 PK STRL BLUE (TOWEL DISPOSABLE) ×2 IMPLANT
TOWEL OR NON WOVEN STRL DISP B (DISPOSABLE) ×2 IMPLANT
TRAY FOLEY MTR SLVR 16FR STAT (SET/KITS/TRAYS/PACK) IMPLANT
TROCAR BLADELESS OPT 5 100 (ENDOMECHANICALS) ×2 IMPLANT
TUBING INSUFFLATION 10FT LAP (TUBING) ×2 IMPLANT

## 2019-01-02 NOTE — Interval H&P Note (Signed)
History and Physical Interval Note:  01/02/2019 10:34 AM  [name]  has presented today for surgery, with the diagnosis of CHOLECYSTITIS.  The various methods of treatment have been discussed with the patient and family. After consideration of risks, benefits and other options for treatment, the patient has consented to  Procedure(s): XI ROBOTIC [place] (N/A) as a surgical intervention.  The patient's history has been reviewed, patient examined, no change in status, stable for surgery.  I have reviewed the patient's chart and labs.  Questions were answered to the patient's satisfaction.     Antoninette Lerner Rich Brave

## 2019-01-02 NOTE — Op Note (Signed)
Operative Note  [name] 78 y.o. male [id number]  01/02/2019  Surgeon: Clovis Riley MD FACS  Assistant: Gurney Maxin MD FACS  Procedure performed: [facility] Robotic Cholecystectomy  Preop diagnosis: chronic cholecystitis, history of recurrent choledocholithiasis and prior cholangitis Post-op diagnosis/intraop findings: same  Specimens: gallbladder  Retained items: none  EBL: minimal  Complications: none  Description of procedure: After obtaining informed consent the patient was brought to the operating room. Antibiotics were administered. SCD's were applied. General endotracheal anesthesia was initiated and a formal time-out was performed. The abdomen was prepped and draped in the usual sterile fashion and the abdomen was entered using a periumbilical Veress needle just to the patient's left of midline after instilling the site with local. Insufflation to 84mmHg was obtained, 25mm trocar and camera inserted, and gross inspection revealed no evidence of injury from our entry or other intraabdominal abnormalities.  Bilateral laparoscopic assisted taps blocks were performed with Exparel mixed with quarter percent Marcaine.  3 additional 8 mm robotic trocars were introduced in the under direct visualization and following infiltration with local.  The robot was then docked and instruments inserted under direct visualization.  The gallbladder was retracted cephalad and the infundibulum was retracted laterally.  There was evidence of chronic cholecystitis with a copious amount of fat infiltration and edema of the peritoneum overlying the gallbladder and porta.  The liver is purplish gray in color and slightly contracted, but no mass or evidence of malignancy was apparent on gross inspection of the liver or right upper quadrant.  A combination of hook electrocautery and blunt dissection was utilized to clear the peritoneum from the neck and cystic duct, circumferentially isolating the cystic  artery and cystic duct and lifting the gallbladder from the cystic plate. The critical view of safety was achieved with the cystic artery, cystic duct, and liver bed visualized between them with no other structures.  This was confirmed using firefly. The artery was clipped with a single clip proximally and distally and divided as was the cystic duct with three clips on the proximal end. The gallbladder was dissected from the liver plate using electrocautery. Once freed the gallbladder was placed in an endocatch bag and removed through the upper quadrant trocar site. A small amount of bleeding on the liver bed was controlled with cautery. Some bile had been spilled from the gallbladder during its dissection from the liver bed. This was aspirated and the right upper quadrant was irrigated copiously until the effluent was clear. Hemostasis was once again confirmed, and reinspection of the abdomen revealed no injuries. The clips were well opposed without any bile leak from the duct or the liver bed.  Again firefly was employed and there was no bile leak present.  The robotic instruments were removed under direct visualization and the robot undocked.  The abdomen was desufflated and all trocars removed. The skin incisions were closed with subcuticular monocryl and Dermabond. The patient was awakened, extubated and transported to the recovery room in stable condition.    All counts were correct at the completion of the case.

## 2019-01-02 NOTE — Anesthesia Procedure Notes (Signed)
Procedure Name: Intubation Date/Time: 01/02/2019 11:45 AM Performed by: Maxwell Caul, CRNA Pre-anesthesia Checklist: Patient identified, Emergency Drugs available, Suction available and Patient being monitored Patient Re-evaluated:Patient Re-evaluated prior to induction Oxygen Delivery Method: Circle system utilized Preoxygenation: Pre-oxygenation with 100% oxygen Induction Type: IV induction Ventilation: Mask ventilation without difficulty and Oral airway inserted - appropriate to patient size Laryngoscope Size: Mac and 4 Grade View: Grade I Tube type: Oral Tube size: 7.5 mm Number of attempts: 1 Airway Equipment and Method: Stylet and Oral airway Placement Confirmation: ETT inserted through vocal cords under direct vision,  positive ETCO2 and breath sounds checked- equal and bilateral Secured at: 21 cm Tube secured with: Tape Dental Injury: Teeth and Oropharynx as per pre-operative assessment

## 2019-01-02 NOTE — Discharge Instructions (Signed)
LAPAROSCOPIC/ ROBOTIC SURGERY: POST OP INSTRUCTIONS  ######################################################################  EAT Gradually transition to a high fiber diet with a fiber supplement over the next few weeks after discharge.  Start with a pureed / full liquid diet (see below)  WALK Walk an hour a day.  Control your pain to do that.    CONTROL PAIN Control pain so that you can walk, sleep, tolerate sneezing/coughing, go up/down stairs.  HAVE A BOWEL MOVEMENT DAILY Keep your bowels regular to avoid problems.  OK to try a laxative to override constipation.  OK to use an antidairrheal to slow down diarrhea.  Call if not better after 2 tries  CALL IF YOU HAVE PROBLEMS/CONCERNS Call if you are still struggling despite following these instructions. Call if you have concerns not answered by these instructions  ######################################################################    1. DIET: Follow a light bland diet & liquids the first 24 hours after arrival home, such as soup, liquids, starches, etc.  Be sure to drink plenty of fluids.  Quickly advance to a usual solid diet within a few days.  Avoid fast food or heavy, high fat meals as you are more likely to get nauseated or have irregular bowels.  A low-sugar, high-fiber diet for the rest of your life is ideal.  2. Take your usually prescribed home medications unless otherwise directed.  3. PAIN CONTROL: a. Pain is best controlled by a usual combination of three different methods TOGETHER: i. Ice/Heat ii. Over the counter pain medication iii. Prescription pain medication b. Most patients will experience some swelling and bruising around the incisions.  Ice packs or heating pads (30-60 minutes up to 6 times a day) will help. Use ice for the first few days to help decrease swelling and bruising, then switch to heat to help relax tight/sore spots and speed recovery.  Some people prefer to use ice alone, heat alone, alternating  between ice & heat.  Experiment to what works for you.  Swelling and bruising can take several weeks to resolve.   c. It is helpful to take an over-the-counter pain medication regularly for the first few weeks.  Choose one of the following that works best for you: i. Naproxen (Aleve, etc)  Two 220mg  tabs twice a day  OR Ibuprofen (Advil, etc) Three 200mg  tabs four times a day (every meal & bedtime) AND ii. Acetaminophen (Tylenol, etc) 500-650mg  four times a day (every meal & bedtime) d. A  prescription for pain medication (such as oxycodone, hydrocodone, tramadol, gabapentin, methocarbamol, etc) should be given to you upon discharge.  Take your pain medication as prescribed.  i. If you are having problems/concerns with the prescription medicine (does not control pain, nausea, vomiting, rash, itching, etc), please call us [phone] to see if we need to switch you to a different pain medicine that will work better for you and/or control your side effect better. ii. If you need a refill on your pain medication, please give [place] 48 hour notice.  contact your pharmacy.  They will contact our office to request authorization. Prescriptions will not be filled after 5 pm or on week-ends  4. Avoid getting constipated.   a. Between the surgery and the pain medications, it is common to experience some constipation.   b. Increasing fluid intake and taking a fiber supplement (such as Metamucil, Citrucel, FiberCon, MiraLax, etc) 1-2 times a day regularly will usually help prevent this problem from occurring.   c. A mild laxative (prune juice, Milk of Magnesia, MiraLax, etc)  should be taken according to package directions if there are no bowel movements after 48 hours.   5. Watch out for diarrhea.   a. If you have many loose bowel movements, simplify your diet to bland foods & liquids for a few days.   b. Stop any stool softeners and decrease your fiber supplement.   c. Switching to mild anti-diarrheal medications  (Kayopectate, Pepto Bismol) can help.   d. If this worsens or does not improve, please call us.  6. Wash / shower every day.  You may shower over the skin glue which is waterproof.  Continue to shower over incision(s) after the dressing is off. No rubbing, scrubbing, lotions or ointments to incisions. Do not soak, submerge or swim for at least 2 weeks or until incisions are completely healed.  7. Glue will flake off after 2 weeks. You may leave incisions open to air, or you may apply dressing/Band-Aid to cover the incision for comfort if you wish.   8. ACTIVITIES as tolerated:   a. You may resume regular (light) daily activities beginning the next day--such as daily self-care, walking, climbing stairs--gradually increasing activities as tolerated.  If you can walk 30 minutes without difficulty, it is safe to try more intense activity such as jogging, treadmill, bicycling, low-impact aerobics, swimming, etc. b. Save the most intensive and strenuous activity for last such as sit-ups, heavy lifting, contact sports, etc  Refrain from any heavy lifting or straining until you are off narcotics for pain control.   c. DO NOT PUSH THROUGH PAIN.  Let pain be your guide: If it hurts to do something, don't do it.  Pain is your body warning you to avoid that activity for another week until the pain goes down. d. You may drive when you are no longer taking prescription pain medication, you can comfortably wear a seatbelt, and you can safely maneuver your car and apply brakes. e. Dennis Bast may have sexual intercourse when it is comfortable.  9. FOLLOW UP in our office a. Please call [facility] at [phone] to set up an appointment to see your surgeon in the office for a follow-up appointment approximately 2-3 weeks after your surgery. b. Make sure that you call for this appointment the day you arrive home to insure a convenient appointment time.  10. IF YOU HAVE DISABILITY OR FAMILY LEAVE FORMS, BRING THEM TO THE OFFICE  FOR PROCESSING.  DO NOT GIVE THEM TO YOUR DOCTOR.   WHEN TO CALL us [phone]: 1. Poor pain control 2. Reactions / problems with new medications (rash/itching, nausea, etc)  3. Fever over 101.5 F (38.5 C) 4. Inability to urinate 5. Nausea and/or vomiting 6. Worsening swelling or bruising 7. Continued bleeding from incision. 8. Increased pain, redness, or drainage from the incision   The clinic staff is available to answer your questions during regular business hours (8:30am-5pm).  Please don't hesitate to call and ask to speak to one of our nurses for clinical concerns.   If you have a medical emergency, go to the nearest emergency room or call 911.  A surgeon from [facility] is always on call at the [facility], [place], [place], [place], [place]  [id number] ? MAIN: [phone] ? TOLL FREE: [phone] ?  FAX [phone] [email or web address]

## 2019-01-02 NOTE — Transfer of Care (Signed)
Immediate Anesthesia Transfer of Care Note  Patient: [name]  Procedure(s) Performed: XI ROBOTIC ASSISTED LAPAROSCOPIC CHOLECYSTECTOMY (N/A )  Patient Location: PACU  Anesthesia Type:General  Level of Consciousness: awake, alert  and oriented  Airway & Oxygen Therapy: Patient Spontanous Breathing and Patient connected to face mask oxygen  Post-op Assessment: Report given to RN and Post -op Vital signs reviewed and stable  Post vital signs: Reviewed and stable  Last Vitals:  Vitals Value Taken Time  BP 123/71 01/02/19 1302  Temp    Pulse 60 01/02/19 1303  Resp 18 01/02/19 1303  SpO2 100 % 01/02/19 1303  Vitals shown include unvalidated device data.  Last Pain:  Vitals:   01/02/19 1031  TempSrc:   PainSc: 7       Patients Stated Pain Goal: 3 ([id number])  Complications: No apparent anesthesia complications

## 2019-01-03 LAB — SURGICAL PATHOLOGY

## 2019-01-04 ENCOUNTER — Other Ambulatory Visit: Payer: Self-pay

## 2019-01-04 ENCOUNTER — Emergency Department (HOSPITAL_COMMUNITY): Payer: Medicare Other

## 2019-01-04 ENCOUNTER — Encounter (HOSPITAL_COMMUNITY): Payer: Self-pay | Admitting: Emergency Medicine

## 2019-01-04 ENCOUNTER — Inpatient Hospital Stay (HOSPITAL_COMMUNITY)
Admission: EM | Admit: 2019-01-04 | Discharge: 2019-01-08 | DRG: 853 | Disposition: A | Discharge status: Home Health | Payer: Medicare Other | Attending: Internal Medicine | Admitting: Internal Medicine

## 2019-01-04 DIAGNOSIS — I25810 Atherosclerosis of coronary artery bypass graft(s) without angina pectoris: Secondary | ICD-10-CM | POA: Diagnosis not present

## 2019-01-04 DIAGNOSIS — I21A1 Myocardial infarction type 2: Secondary | ICD-10-CM

## 2019-01-04 DIAGNOSIS — E1122 Type 2 diabetes mellitus with diabetic chronic kidney disease: Secondary | ICD-10-CM | POA: Diagnosis present

## 2019-01-04 DIAGNOSIS — Z79891 Long term (current) use of opiate analgesic: Secondary | ICD-10-CM

## 2019-01-04 DIAGNOSIS — J9601 Acute respiratory failure with hypoxia: Secondary | ICD-10-CM | POA: Diagnosis not present

## 2019-01-04 DIAGNOSIS — I5043 Acute on chronic combined systolic (congestive) and diastolic (congestive) heart failure: Secondary | ICD-10-CM | POA: Diagnosis present

## 2019-01-04 DIAGNOSIS — K811 Chronic cholecystitis: Secondary | ICD-10-CM | POA: Diagnosis present

## 2019-01-04 DIAGNOSIS — I340 Nonrheumatic mitral (valve) insufficiency: Secondary | ICD-10-CM | POA: Diagnosis not present

## 2019-01-04 DIAGNOSIS — Z808 Family history of malignant neoplasm of other organs or systems: Secondary | ICD-10-CM

## 2019-01-04 DIAGNOSIS — K219 Gastro-esophageal reflux disease without esophagitis: Secondary | ICD-10-CM | POA: Diagnosis present

## 2019-01-04 DIAGNOSIS — Z823 Family history of stroke: Secondary | ICD-10-CM

## 2019-01-04 DIAGNOSIS — J440 Chronic obstructive pulmonary disease with acute lower respiratory infection: Secondary | ICD-10-CM | POA: Diagnosis present

## 2019-01-04 DIAGNOSIS — I130 Hypertensive heart and chronic kidney disease with heart failure and stage 1 through stage 4 chronic kidney disease, or unspecified chronic kidney disease: Secondary | ICD-10-CM | POA: Diagnosis present

## 2019-01-04 DIAGNOSIS — J309 Allergic rhinitis, unspecified: Secondary | ICD-10-CM | POA: Diagnosis present

## 2019-01-04 DIAGNOSIS — N1832 Chronic kidney disease, stage 3b: Secondary | ICD-10-CM | POA: Diagnosis present

## 2019-01-04 DIAGNOSIS — I351 Nonrheumatic aortic (valve) insufficiency: Secondary | ICD-10-CM | POA: Diagnosis present

## 2019-01-04 DIAGNOSIS — I2510 Atherosclerotic heart disease of native coronary artery without angina pectoris: Secondary | ICD-10-CM | POA: Diagnosis present

## 2019-01-04 DIAGNOSIS — Z951 Presence of aortocoronary bypass graft: Secondary | ICD-10-CM

## 2019-01-04 DIAGNOSIS — Z888 Allergy status to other drugs, medicaments and biological substances status: Secondary | ICD-10-CM

## 2019-01-04 DIAGNOSIS — E785 Hyperlipidemia, unspecified: Secondary | ICD-10-CM | POA: Diagnosis present

## 2019-01-04 DIAGNOSIS — R6520 Severe sepsis without septic shock: Secondary | ICD-10-CM

## 2019-01-04 DIAGNOSIS — Z20828 Contact with and (suspected) exposure to other viral communicable diseases: Secondary | ICD-10-CM | POA: Diagnosis present

## 2019-01-04 DIAGNOSIS — Z7982 Long term (current) use of aspirin: Secondary | ICD-10-CM

## 2019-01-04 DIAGNOSIS — D519 Vitamin B12 deficiency anemia, unspecified: Secondary | ICD-10-CM | POA: Diagnosis present

## 2019-01-04 DIAGNOSIS — I214 Non-ST elevation (NSTEMI) myocardial infarction: Secondary | ICD-10-CM

## 2019-01-04 DIAGNOSIS — G2581 Restless legs syndrome: Secondary | ICD-10-CM | POA: Diagnosis present

## 2019-01-04 DIAGNOSIS — J189 Pneumonia, unspecified organism: Secondary | ICD-10-CM

## 2019-01-04 DIAGNOSIS — Z79899 Other long term (current) drug therapy: Secondary | ICD-10-CM

## 2019-01-04 DIAGNOSIS — Z91048 Other nonmedicinal substance allergy status: Secondary | ICD-10-CM

## 2019-01-04 DIAGNOSIS — Z8619 Personal history of other infectious and parasitic diseases: Secondary | ICD-10-CM

## 2019-01-04 DIAGNOSIS — R778 Other specified abnormalities of plasma proteins: Secondary | ICD-10-CM | POA: Diagnosis not present

## 2019-01-04 DIAGNOSIS — A419 Sepsis, unspecified organism: Principal | ICD-10-CM

## 2019-01-04 DIAGNOSIS — R7989 Other specified abnormal findings of blood chemistry: Secondary | ICD-10-CM | POA: Diagnosis not present

## 2019-01-04 DIAGNOSIS — I10 Essential (primary) hypertension: Secondary | ICD-10-CM | POA: Diagnosis present

## 2019-01-04 DIAGNOSIS — Z8551 Personal history of malignant neoplasm of bladder: Secondary | ICD-10-CM

## 2019-01-04 DIAGNOSIS — F329 Major depressive disorder, single episode, unspecified: Secondary | ICD-10-CM | POA: Diagnosis present

## 2019-01-04 DIAGNOSIS — Z8719 Personal history of other diseases of the digestive system: Secondary | ICD-10-CM

## 2019-01-04 DIAGNOSIS — R0602 Shortness of breath: Secondary | ICD-10-CM | POA: Diagnosis not present

## 2019-01-04 DIAGNOSIS — Z8249 Family history of ischemic heart disease and other diseases of the circulatory system: Secondary | ICD-10-CM

## 2019-01-04 DIAGNOSIS — G4733 Obstructive sleep apnea (adult) (pediatric): Secondary | ICD-10-CM | POA: Diagnosis present

## 2019-01-04 DIAGNOSIS — Z86010 Personal history of colonic polyps: Secondary | ICD-10-CM

## 2019-01-04 DIAGNOSIS — F32A Depression, unspecified: Secondary | ICD-10-CM | POA: Diagnosis present

## 2019-01-04 DIAGNOSIS — J9600 Acute respiratory failure, unspecified whether with hypoxia or hypercapnia: Secondary | ICD-10-CM | POA: Diagnosis present

## 2019-01-04 DIAGNOSIS — Z955 Presence of coronary angioplasty implant and graft: Secondary | ICD-10-CM

## 2019-01-04 DIAGNOSIS — I4519 Other right bundle-branch block: Secondary | ICD-10-CM | POA: Diagnosis present

## 2019-01-04 DIAGNOSIS — M1712 Unilateral primary osteoarthritis, left knee: Secondary | ICD-10-CM | POA: Diagnosis present

## 2019-01-04 DIAGNOSIS — Z87891 Personal history of nicotine dependence: Secondary | ICD-10-CM

## 2019-01-04 DIAGNOSIS — N183 Chronic kidney disease, stage 3 unspecified: Secondary | ICD-10-CM | POA: Diagnosis present

## 2019-01-04 DIAGNOSIS — Z981 Arthrodesis status: Secondary | ICD-10-CM

## 2019-01-04 DIAGNOSIS — I5033 Acute on chronic diastolic (congestive) heart failure: Secondary | ICD-10-CM

## 2019-01-04 LAB — CBC
HCT: 40.5 % (ref 39.0–52.0)
Hemoglobin: 12.2 g/dL — ABNORMAL LOW (ref 13.0–17.0)
MCH: 28 pg (ref 26.0–34.0)
MCHC: 30.1 g/dL (ref 30.0–36.0)
MCV: 93.1 fL (ref 80.0–100.0)
Platelets: 174 10*3/uL (ref 150–400)
RBC: 4.35 MIL/uL (ref 4.22–5.81)
RDW: 14.5 % (ref 11.5–15.5)
WBC: 22.8 10*3/uL — ABNORMAL HIGH (ref 4.0–10.5)
nRBC: 0 % (ref 0.0–0.2)

## 2019-01-04 LAB — BRAIN NATRIURETIC PEPTIDE: B Natriuretic Peptide: 1392.1 pg/mL — ABNORMAL HIGH (ref 0.0–100.0)

## 2019-01-04 LAB — COMPREHENSIVE METABOLIC PANEL
ALT: 25 U/L (ref 0–44)
AST: 42 U/L — ABNORMAL HIGH (ref 15–41)
Albumin: 3.1 g/dL — ABNORMAL LOW (ref 3.5–5.0)
Alkaline Phosphatase: 66 U/L (ref 38–126)
Anion gap: 11 (ref 5–15)
BUN: 25 mg/dL — ABNORMAL HIGH (ref 8–23)
CO2: 27 mmol/L (ref 22–32)
Calcium: 9.1 mg/dL (ref 8.9–10.3)
Chloride: 103 mmol/L (ref 98–111)
Creatinine, Ser: 1.51 mg/dL — ABNORMAL HIGH (ref 0.61–1.24)
GFR calc Af Amer: 51 mL/min — ABNORMAL LOW (ref >60)
GFR calc non Af Amer: 44 mL/min — ABNORMAL LOW (ref >60)
Glucose, Bld: 123 mg/dL — ABNORMAL HIGH (ref 70–99)
Potassium: 3.9 mmol/L (ref 3.5–5.1)
Sodium: 141 mmol/L (ref 135–145)
Total Bilirubin: 0.8 mg/dL (ref 0.3–1.2)
Total Protein: 7.6 g/dL (ref 6.5–8.1)

## 2019-01-04 LAB — TROPONIN I (HIGH SENSITIVITY)
Troponin I (High Sensitivity): 562 ng/L (ref <18)
Troponin I (High Sensitivity): 2259 ng/L (ref <18)
Troponin I (High Sensitivity): 3189 ng/L (ref <18)
Troponin I (High Sensitivity): 4847 ng/L (ref <18)

## 2019-01-04 LAB — SARS CORONAVIRUS 2 (TAT 6-24 HRS): SARS Coronavirus 2: NEGATIVE

## 2019-01-04 LAB — POC SARS CORONAVIRUS 2 AG -  ED: SARS Coronavirus 2 Ag: NEGATIVE

## 2019-01-04 MED ORDER — SODIUM CHLORIDE 0.9 % IV SOLN
1.0000 g | INTRAVENOUS | Status: DC
  Administered 2019-01-05 – 2019-01-07 (×3): 1 g via INTRAVENOUS
  Filled 2019-01-04: qty 1
  Filled 2019-01-04: qty 10
  Filled 2019-01-04 (×2): qty 1

## 2019-01-04 MED ORDER — ISOSORBIDE MONONITRATE ER 60 MG PO TB24
60.0000 mg | ORAL_TABLET | Freq: Every day | ORAL | Status: DC
  Administered 2019-01-04 – 2019-01-08 (×5): 60 mg via ORAL
  Filled 2019-01-04 (×4): qty 1
  Filled 2019-01-04: qty 2

## 2019-01-04 MED ORDER — SODIUM CHLORIDE 0.9 % IV SOLN: 500.0000 mg | Freq: Once | INTRAVENOUS | Status: DC

## 2019-01-04 MED ORDER — CEFTRIAXONE SODIUM 1 G IJ SOLR
1.0000 g | Freq: Once | INTRAMUSCULAR | Status: AC
Start: 2019-01-04 — End: 2019-01-04
  Administered 2019-01-04: 1 g via INTRAVENOUS
  Filled 2019-01-04: qty 10

## 2019-01-04 MED ORDER — SODIUM CHLORIDE 0.9% FLUSH: 3.0000 mL | Freq: Once | INTRAVENOUS | Status: DC

## 2019-01-04 MED ORDER — IOHEXOL 350 MG/ML SOLN
100.0000 mL | Freq: Once | INTRAVENOUS | Status: AC | PRN
  Administered 2019-01-04: 100 mL via INTRAVENOUS

## 2019-01-04 MED ORDER — HEPARIN SODIUM (PORCINE) 5000 UNIT/ML IJ SOLN
5000.0000 [IU] | Freq: Three times a day (TID) | INTRAMUSCULAR | Status: DC
  Administered 2019-01-04 – 2019-01-05 (×2): 5000 [IU] via SUBCUTANEOUS
  Filled 2019-01-04 (×2): qty 1

## 2019-01-04 MED ORDER — ONDANSETRON HCL 4 MG/2ML IJ SOLN
4.0000 mg | Freq: Four times a day (QID) | INTRAMUSCULAR | Status: DC | PRN
  Administered 2019-01-04 – 2019-01-05 (×2): 4 mg via INTRAVENOUS
  Filled 2019-01-04 (×2): qty 2

## 2019-01-04 MED ORDER — AMLODIPINE BESYLATE 2.5 MG PO TABS
2.5000 mg | ORAL_TABLET | Freq: Every day | ORAL | Status: DC
  Administered 2019-01-04 – 2019-01-08 (×5): 2.5 mg via ORAL
  Filled 2019-01-04 (×5): qty 1

## 2019-01-04 MED ORDER — ROSUVASTATIN CALCIUM 20 MG PO TABS
20.0000 mg | ORAL_TABLET | Freq: Every day | ORAL | Status: DC
  Administered 2019-01-04 – 2019-01-08 (×5): 20 mg via ORAL
  Filled 2019-01-04 (×5): qty 1

## 2019-01-04 MED ORDER — FUROSEMIDE 10 MG/ML IJ SOLN
40.0000 mg | Freq: Once | INTRAMUSCULAR | Status: AC
  Administered 2019-01-04: 40 mg via INTRAVENOUS
  Filled 2019-01-04: qty 4

## 2019-01-04 MED ORDER — SODIUM CHLORIDE 0.9 % IV SOLN
500.0000 mg | Freq: Once | INTRAVENOUS | Status: AC
  Administered 2019-01-04: 500 mg via INTRAVENOUS
  Filled 2019-01-04: qty 500

## 2019-01-04 MED ORDER — ASPIRIN 81 MG PO CHEW
324.0000 mg | CHEWABLE_TABLET | Freq: Once | ORAL | Status: AC
  Administered 2019-01-04: 324 mg via ORAL
  Filled 2019-01-04: qty 4

## 2019-01-04 MED ORDER — NEBIVOLOL HCL 5 MG PO TABS
10.0000 mg | ORAL_TABLET | Freq: Every day | ORAL | Status: DC
  Administered 2019-01-05 – 2019-01-08 (×4): 10 mg via ORAL
  Filled 2019-01-04 (×4): qty 2

## 2019-01-04 MED ORDER — ACETAMINOPHEN 325 MG PO TABS
650.0000 mg | ORAL_TABLET | Freq: Four times a day (QID) | ORAL | Status: DC | PRN
  Administered 2019-01-08: 650 mg via ORAL
  Filled 2019-01-04: qty 2

## 2019-01-04 MED ORDER — HYDROMORPHONE HCL 1 MG/ML IJ SOLN
0.5000 mg | INTRAMUSCULAR | Status: AC | PRN
  Administered 2019-01-04 – 2019-01-05 (×2): 0.5 mg via INTRAVENOUS
  Filled 2019-01-04 (×2): qty 1

## 2019-01-04 MED ORDER — ROPINIROLE HCL 1 MG PO TABS
2.0000 mg | ORAL_TABLET | Freq: Every day | ORAL | Status: DC
  Administered 2019-01-04 – 2019-01-07 (×4): 2 mg via ORAL
  Filled 2019-01-04 (×4): qty 2

## 2019-01-04 MED ORDER — ASPIRIN EC 81 MG PO TBEC
81.0000 mg | DELAYED_RELEASE_TABLET | Freq: Every day | ORAL | Status: DC
  Administered 2019-01-04 – 2019-01-07 (×4): 81 mg via ORAL
  Filled 2019-01-04 (×4): qty 1

## 2019-01-04 MED ORDER — ONDANSETRON HCL 4 MG PO TABS: 4.0000 mg | ORAL_TABLET | Freq: Four times a day (QID) | ORAL | Status: DC | PRN

## 2019-01-04 MED ORDER — VENLAFAXINE HCL 75 MG PO TABS
75.0000 mg | ORAL_TABLET | Freq: Every day | ORAL | Status: DC
  Administered 2019-01-04 – 2019-01-08 (×5): 75 mg via ORAL
  Filled 2019-01-04 (×2): qty 1
  Filled 2019-01-04: qty 2
  Filled 2019-01-04 (×2): qty 1

## 2019-01-04 MED ORDER — HYDROMORPHONE HCL 1 MG/ML IJ SOLN
0.5000 mg | Freq: Once | INTRAMUSCULAR | Status: AC | PRN
  Administered 2019-01-04: 0.5 mg via INTRAVENOUS
  Filled 2019-01-04: qty 1

## 2019-01-04 MED ORDER — DEXTROSE 5 % IV SOLN
250.0000 mg | INTRAVENOUS | Status: DC
  Administered 2019-01-05: 250 mg via INTRAVENOUS
  Filled 2019-01-04 (×2): qty 250

## 2019-01-04 MED ORDER — EZETIMIBE 10 MG PO TABS
10.0000 mg | ORAL_TABLET | Freq: Every day | ORAL | Status: DC
  Administered 2019-01-04 – 2019-01-08 (×5): 10 mg via ORAL
  Filled 2019-01-04 (×5): qty 1

## 2019-01-04 MED ORDER — TRAMADOL HCL 50 MG PO TABS
50.0000 mg | ORAL_TABLET | Freq: Four times a day (QID) | ORAL | Status: DC | PRN
  Administered 2019-01-05 – 2019-01-08 (×6): 50 mg via ORAL
  Filled 2019-01-04 (×6): qty 1

## 2019-01-04 MED ORDER — PANTOPRAZOLE SODIUM 40 MG PO TBEC
80.0000 mg | DELAYED_RELEASE_TABLET | Freq: Every day | ORAL | Status: DC
  Administered 2019-01-04 – 2019-01-08 (×5): 80 mg via ORAL
  Filled 2019-01-04 (×5): qty 2

## 2019-01-04 MED ORDER — NEBIVOLOL HCL 10 MG PO TABS
10.0000 mg | ORAL_TABLET | Freq: Every day | ORAL | Status: DC
  Filled 2019-01-04: qty 1

## 2019-01-04 MED ORDER — ACETAMINOPHEN 650 MG RE SUPP: 650.0000 mg | Freq: Four times a day (QID) | RECTAL | Status: DC | PRN

## 2019-01-04 NOTE — ED Notes (Signed)
Spoke to Dr. Radford Pax.  Was told to call hospitalist to find out it it's ok to put on Heparin drip s/p cholecystectomy

## 2019-01-04 NOTE — ED Provider Notes (Signed)
[facility] EMERGENCY DEPARTMENT Provider Note   CSN: [id number] Arrival date & time: 01/04/19  1135     History Chief Complaint  Patient presents with  . Shortness of Breath    [name] is a 78 y.o. male.  78 year old male with extensive past medical history below including CAD, COPD, hypertension, recent cholecystectomy who presents with shortness of breath.  2 days ago, the patient had an outpatient laparoscopic cholecystectomy by Dr. Windle Guard.  Surgery went well and he was discharged home.  His pain has been adequately controlled by oxycodone at home and he reports doing well yesterday.  This morning, he woke up and had sudden onset of shortness of breath.  When he was picked up by EMS he was hypoxic in the 80s and placed on a nonrebreather.  He reports some coughing today but had no cough yesterday.  He tested negative for COVID-19 prior to the surgery.  He denies any associated chest pain.  The history is provided by the patient.  Shortness of Breath      Past Medical History:  Diagnosis Date  . Allergy   . B12 deficiency anemia   . Blood transfusion without reported diagnosis   . CAD (coronary artery disease)   . Cancer ([place])    bladder-   . Colon polyps   . COPD (chronic obstructive pulmonary disease) ([place])   . Depression   . Esophagus, Barrett's   . GERD (gastroesophageal reflux disease)   . History of bladder cancer    Bladder cancer "8 times"  . History of hiatal hernia   . Hyperlipidemia   . Hypertension   . Localized osteoarthrosis, lower leg   . Restless leg syndrome   . Sleep apnea    does not wear cpap  . Stenosis of esophagus     Patient Active Problem List   Diagnosis Date Noted  . elevated IgG4 11/01/2018  . Choledocholithiasis 10/27/2018  . Dilation of biliary tract 10/27/2018  . Esophageal dysphagia 10/27/2018  . Upper airway cough syndrome 07/28/2018  . Pleural effusion on left 07/28/2018  . History of  pancreatitis 05/12/2018  . Abnormal LFTs 05/12/2018  . Abnormal findings on esophagogastroduodenoscopy (EGD) 05/12/2018  . History of ERCP 05/12/2018  . Anemia 05/12/2018  . Biliary stricture   . ESBL (extended spectrum beta-lactamase) producing bacteria infection 04/04/2018  . Ascending cholangitis 04/01/2018  . Diabetes mellitus type 2, noninsulin dependent ([place]) 03/31/2018  . Pancreatitis 03/31/2018  . Sepsis ([place]) 03/31/2018  . Bacteremia due to Gram-negative bacteria 03/31/2018  . Abdominal pain   . Dilated bile duct   . Acute biliary pancreatitis 03/30/2018  . Leukocytosis 03/30/2018  . CAD (coronary artery disease) 03/30/2018  . Osteoarthritis of left knee 10/18/2017  . Cervical radiculopathy 10/04/2017  . Pain of left hand 07/19/2017  . Pre-operative cardiovascular examination 06/16/2017  . CKD (chronic kidney disease), stage III 06/16/2017  . Carpal tunnel syndrome 05/27/2017  . 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 05/10/2017  . Open fracture of base of middle phalanx of finger 02/17/2017  . Laceration of index finger 02/17/2017  . Laceration of nail bed of finger 02/17/2017  . Laceration of thumb 02/17/2017  . Open fracture of distal phalanx of finger 02/17/2017  . Open fractures of multiple sites of phalanx of left hand 11/24/2016  . B12 deficiency 05/28/2015  . GERD (gastroesophageal reflux disease) 11/16/2014  . Hyperlipidemia LDL goal <70 11/21/2013  . DOE (dyspnea on exertion) 11/21/2013  . Other dysphagia 07/14/2013  .  History of esophageal stricture 07/14/2013  . Hx of CABG 06/05/2013  . Leg pain, bilateral 10/18/2012  . Restless leg syndrome 06/21/2012  . ULNAR NEUROPATHY, LEFT 08/05/2009  . Urinary obstruction 10/19/2008  . ACTINIC KERATOSIS 06/25/2008  . DRY EYE SYNDROME 10/13/2007  . CARCINOMA, BLADDER, HX OF 07/02/2007  . BARRETTS ESOPHAGUS 05/23/2007  . Hypothyroidism 04/08/2007  . Other malaise and fatigue 04/08/2007  . Essential hypertension  10/12/2006  . ANEMIA, B12 DEFICIENCY 09/08/2006  . DEPRESSION 07/29/2006  . NEUROPATHY, IDIOPATHIC PERIPHERAL NEC 07/29/2006  . Allergic rhinitis 07/29/2006  . LOW BACK PAIN 07/29/2006  . COLONIC POLYPS 11/04/2000    Past Surgical History:  Procedure Laterality Date  . BILIARY BRUSHING  04/01/2018   Procedure: BILIARY BRUSHING;  Surgeon: Rush Landmark Telford Nab., MD;  Location: [place];  Service: Gastroenterology;;  . BILIARY BRUSHING  09/12/2018   Procedure: BILIARY BRUSHING;  Surgeon: Irving Copas., MD;  Location: [place];  Service: Gastroenterology;;  . BILIARY BRUSHING  11/28/2018   Procedure: BILIARY BRUSHING;  Surgeon: Irving Copas., MD;  Location: [place];  Service: Gastroenterology;;  . BILIARY DILATION  09/12/2018   Procedure: BILIARY DILATION;  Surgeon: Irving Copas., MD;  Location: [place];  Service: Gastroenterology;;  . BILIARY DILATION  11/28/2018   Procedure: BILIARY DILATION;  Surgeon: Irving Copas., MD;  Location: [place];  Service: Gastroenterology;;  . BILIARY STENT PLACEMENT  04/01/2018   Procedure: BILIARY STENT PLACEMENT;  Surgeon: Irving Copas., MD;  Location: [place];  Service: Gastroenterology;;  . BILIARY STENT PLACEMENT  09/12/2018   Procedure: BILIARY STENT PLACEMENT;  Surgeon: Irving Copas., MD;  Location: [place];  Service: Gastroenterology;;  . BILIARY STENT PLACEMENT  11/28/2018   Procedure: BILIARY STENT PLACEMENT;  Surgeon: Irving Copas., MD;  Location: [place];  Service: Gastroenterology;;  . BIOPSY  04/01/2018   Procedure: BIOPSY;  Surgeon: Irving Copas., MD;  Location: [place];  Service: Gastroenterology;;  . BIOPSY  09/12/2018   Procedure: BIOPSY;  Surgeon: Irving Copas., MD;  Location: [place];  Service: Gastroenterology;;  . BIOPSY  11/28/2018   Procedure: BIOPSY;  Surgeon: Irving Copas., MD;  Location: [facility]  ENDOSCOPY;  Service: Gastroenterology;;  . bladder cancer      x 8 cystoscopy  . CERVICAL DISCECTOMY     ACDF  . COLONOSCOPY  11/17/2005   normal   . CORONARY ARTERY BYPASS GRAFT     x4  . ENDOSCOPIC MUCOSAL RESECTION  09/12/2018   Procedure: ENDOSCOPIC MUCOSAL RESECTION;  Surgeon: Rush Landmark Telford Nab., MD;  Location: [facility] ENDOSCOPY;  Service: Gastroenterology;;  . ENDOSCOPIC RETROGRADE CHOLANGIOPANCREATOGRAPHY (ERCP) WITH PROPOFOL N/A 04/01/2018   Procedure: ENDOSCOPIC RETROGRADE CHOLANGIOPANCREATOGRAPHY (ERCP) WITH PROPOFOL;  Surgeon: Irving Copas., MD;  Location: [place];  Service: Gastroenterology;  Laterality: N/A;  . ENDOSCOPIC RETROGRADE CHOLANGIOPANCREATOGRAPHY (ERCP) WITH PROPOFOL N/A 09/12/2018   Procedure: ENDOSCOPIC RETROGRADE CHOLANGIOPANCREATOGRAPHY (ERCP) WITH PROPOFOL;  Surgeon: Rush Landmark Telford Nab., MD;  Location: [place];  Service: Gastroenterology;  Laterality: N/A;  . ERCP N/A 11/28/2018   Procedure: ENDOSCOPIC RETROGRADE CHOLANGIOPANCREATOGRAPHY (ERCP) +EGD with spyglass;  Surgeon: Rush Landmark Telford Nab., MD;  Location: [place];  Service: Gastroenterology;  Laterality: N/A;  . ESOPHAGOGASTRODUODENOSCOPY  04/29/2010  . ESOPHAGOGASTRODUODENOSCOPY (EGD) WITH PROPOFOL N/A 04/01/2018   Procedure: ESOPHAGOGASTRODUODENOSCOPY (EGD) WITH PROPOFOL;  Surgeon: Rush Landmark Telford Nab., MD;  Location: [place];  Service: Gastroenterology;  Laterality: N/A;  . ESOPHAGOGASTRODUODENOSCOPY (EGD) WITH PROPOFOL N/A 09/12/2018   Procedure: ESOPHAGOGASTRODUODENOSCOPY (EGD) WITH  PROPOFOL;  Surgeon: Irving Copas., MD;  Location: [place];  Service: Gastroenterology;  Laterality: N/A;  . ESOPHAGOGASTRODUODENOSCOPY (EGD) WITH PROPOFOL N/A 11/28/2018   Procedure: ESOPHAGOGASTRODUODENOSCOPY (EGD) WITH PROPOFOL;  Surgeon: Rush Landmark Telford Nab., MD;  Location: [place];  Service: Gastroenterology;  Laterality: N/A;  . EUS  04/01/2018   Procedure: FULL  UPPER ENDOSCOPIC ULTRASOUND (EUS) RADIAL;  Surgeon: Irving Copas., MD;  Location: [place];  Service: Gastroenterology;;  . EUS N/A 09/12/2018   Procedure: UPPER ENDOSCOPIC ULTRASOUND (EUS) RADIAL;  Surgeon: Irving Copas., MD;  Location: [place];  Service: Gastroenterology;  Laterality: N/A;  . FINE NEEDLE ASPIRATION  09/12/2018   Procedure: FINE NEEDLE ASPIRATION (FNA) LINEAR;  Surgeon: Irving Copas., MD;  Location: [place];  Service: Gastroenterology;;  . HEMOSTASIS CLIP PLACEMENT  09/12/2018   Procedure: HEMOSTASIS CLIP PLACEMENT;  Surgeon: Irving Copas., MD;  Location: [facility] ENDOSCOPY;  Service: Gastroenterology;;  . I&D EXTREMITY Left 11/24/2016   Procedure: IRRIGATION AND DEBRIDEMENT LEFT HAND, THUMB, INDEX, MIDDLE, RING, AND SMALL FINGERS WITH RECONSTRUCTION;  Surgeon: Roseanne Kaufman, MD;  Location: [place];  Service: Orthopedics;  Laterality: Left;  . KNEE ARTHROSCOPY Left   . LUMBAR LAMINECTOMY     and fusion x 2  . NASAL SINUS SURGERY    . POPLITEAL SYNOVIAL CYST EXCISION    . REMOVAL OF STONES  04/01/2018   Procedure: REMOVAL OF STONES;  Surgeon: Rush Landmark Telford Nab., MD;  Location: [place];  Service: Gastroenterology;;  . REMOVAL OF STONES  09/12/2018   Procedure: REMOVAL OF STONES;  Surgeon: Irving Copas., MD;  Location: [place];  Service: Gastroenterology;;  . REMOVAL OF STONES  11/28/2018   Procedure: REMOVAL OF STONES;  Surgeon: Irving Copas., MD;  Location: [place];  Service: Gastroenterology;;  . Azzie Almas DILATION N/A 09/12/2018   Procedure: Azzie Almas DILATION;  Surgeon: Irving Copas., MD;  Location: [place];  Service: Gastroenterology;  Laterality: N/A;  . SAVORY DILATION N/A 11/28/2018   Procedure: SAVORY DILATION;  Surgeon: Rush Landmark Telford Nab., MD;  Location: [place];  Service: Gastroenterology;  Laterality: N/A;  . SPHINCTEROTOMY  04/01/2018   Procedure: SPHINCTEROTOMY;   Surgeon: Rush Landmark Telford Nab., MD;  Location: [place];  Service: Gastroenterology;;  . Bess Kinds CHOLANGIOSCOPY N/A 11/28/2018   Procedure: [id number] CHOLANGIOSCOPY;  Surgeon: Irving Copas., MD;  Location: [place];  Service: Gastroenterology;  Laterality: N/A;  . STENT REMOVAL  09/12/2018   Procedure: STENT REMOVAL;  Surgeon: Irving Copas., MD;  Location: [place];  Service: Gastroenterology;;  . Lavell Islam REMOVAL  11/28/2018   Procedure: STENT REMOVAL;  Surgeon: Irving Copas., MD;  Location: [place];  Service: Gastroenterology;;  . Lia Foyer LIFTING INJECTION  09/12/2018   Procedure: SUBMUCOSAL LIFTING INJECTION;  Surgeon: Irving Copas., MD;  Location: [facility] END[facility]COPY;  Service: Gastroenterology;;       Family History  Problem Relation Age of Onset  . Melanoma Mother   . Stroke Father   . Hypertension Father   . Coronary artery disease Other   . Colon cancer Neg Hx   . Esophageal cancer Neg Hx   . Stomach cancer Neg Hx   . Rectal cancer Neg Hx   . Pancreatic cancer Neg Hx   . Liver disease Neg Hx   . Inflammatory bowel disease Neg Hx     Social History   Tobacco Use  . Smoking status: Former Smoker    Packs/day: 0.50    Years: 5.00  Pack years: 2.50    Types: Cigarettes    Quit date: 03/30/1976    Years since quitting: 42.7  . Smokeless tobacco: Never Used  Substance Use Topics  . Alcohol use: No    Alcohol/week: 0.0 standard drinks  . Drug use: No    Home Medications Prior to Admission medications   Medication Sig Start Date End Date Taking? Authorizing Provider  acetaminophen (TYLENOL) 325 MG tablet Take 650 mg by mouth every 6 (six) hours as needed for mild pain.   Yes [provider]  amLODipine (NORVASC) 2.5 MG tablet Take 1 tablet (2.5 mg total) by mouth daily. 10/10/18 01/08/19 Yes Troy Sine, MD  aspirin EC 81 MG tablet Take 81 mg by mouth at bedtime.   Yes [provider]  BYSTOLIC 10  MG tablet Take 10 mg by mouth daily. 07/02/18  Yes [provider]  cyanocobalamin (,VITAMIN B-12,) 1000 MCG/ML injection INJECT 1 ML (CC) INTRAMUSCULARLY FOR 1 DOSE Patient taking differently: Inject 1,000 mcg into the muscle every 30 (thirty) days. On the 15th of each month 12/24/17  Yes Isaac Bliss, Rayford Halsted, MD  dicyclomine (BENTYL) 10 MG capsule Take 1 capsule (10 mg total) by mouth every 8 (eight) hours as needed for spasms. 01/07/18  Yes Armbruster, Carlota Raspberry, MD  docusate sodium (COLACE) 100 MG capsule Take 1 capsule (100 mg total) by mouth 2 (two) times daily. OK to decrease to once daily or discontinue if having diarrhea 01/02/19 02/01/19 Yes Clovis Riley, MD  ezetimibe (ZETIA) 10 MG tablet Take 1 tablet (10 mg total) by mouth daily. 07/06/18  Yes Isaac Bliss, Rayford Halsted, MD  Fiber Complete TABS Take 1 tablet by mouth daily.   Yes [provider]  fluticasone (FLONASE) 50 MCG/ACT nasal spray Place 1-2 sprays into both nostrils at bedtime as needed for allergies or rhinitis.   Yes [provider]  folic acid (FOLVITE) A999333 MCG tablet Take 400 mcg by mouth daily.     Yes [provider]  isosorbide mononitrate (IMDUR) 60 MG 24 hr tablet Take 1 tablet (60 mg total) by mouth daily. 08/04/18  Yes Troy Sine, MD  Menthol, Topical Analgesic, (BENGAY EX) Apply 1 application topically daily as needed (pain).   Yes [provider]  Multiple Vitamins-Minerals (MULTIVITAMIN WITH MINERALS) tablet Take 1 tablet by mouth daily.   Yes [provider]  Naphazoline-Pheniramine (OPCON-A) 0.027-0.315 % SOLN Place 1 drop into both eyes daily as needed (itching eyes).   Yes [provider]  omeprazole (PRILOSEC) 40 MG capsule Take 1 capsule (40 mg total) by mouth 2 (two) times daily after a meal. 01/02/19 01/02/20 Yes Clovis Riley, MD  Probiotic Product (PROBIOTIC ADVANCED PO) Take 1 tablet by mouth 2 (two) times daily.    Yes [provider]  rOPINIRole (REQUIP) 2 MG tablet TAKE 1 TABLET BY MOUTH AT BEDTIME Patient taking differently: Take 2 mg by mouth at bedtime.  11/01/18  Yes Isaac Bliss, Rayford Halsted, MD  rosuvastatin (CRESTOR) 20 MG tablet Take 20 mg by mouth daily.   Yes [provider]  traMADol (ULTRAM) 50 MG tablet Take 1 tablet (50 mg total) by mouth every 6 (six) hours as needed. 01/02/19 01/02/20 Yes Clovis Riley, MD  venlafaxine (EFFEXOR) 75 MG tablet Take 1 tablet (75 mg total) by mouth daily. 12/31/17  Yes Erline Hau, MD  NEEDLE, DISP, 25 G (B-D DISP NEEDLE 25GX1") 25G X 1" MISC Inject  1000 mcg into muscle once a month. 12/24/17   Isaac Bliss, Rayford Halsted, MD  nitroGLYCERIN (NITROSTAT) 0.4 MG SL tablet Place 1 tablet (0.4 mg total) under the tongue every 5 (five) minutes as needed for chest pain. 10/05/18   Isaac Bliss, Rayford Halsted, MD  pantoprazole (PROTONIX) 40 MG tablet Take 30- 60 min before your first and last meals of the day Patient not taking: Reported on 01/04/2019 07/27/18   Tanda Rockers, MD    Allergies    Losartan potassium and Tape  Review of Systems   Review of Systems  Respiratory: Positive for shortness of breath.    All other systems reviewed and are negative except that which was mentioned in HPI  Physical Exam Updated Vital Signs BP (!) 152/86   Pulse 97   Temp 98.7 F (37.1 C) (Oral)   Resp (!) 32   SpO2 94%   Physical Exam Vitals and nursing note reviewed.  Constitutional:      General: He is not in acute distress.    Appearance: He is well-developed.  HENT:     Head: Normocephalic and atraumatic.  Eyes:     Conjunctiva/sclera: Conjunctivae normal.  Cardiovascular:     Rate and Rhythm: Normal rate and regular rhythm.     Heart sounds: Normal heart sounds. No murmur.  Pulmonary:     Comments: Dyspneic during conversation without respiratory distress, crackles in bilateral bases Abdominal:     General: Bowel sounds are normal.  There is no distension.     Palpations: Abdomen is soft.     Tenderness: There is no abdominal tenderness.     Comments: Healing laparoscopic surgical sites clean dry and intact  Musculoskeletal:     Cervical back: Neck supple.     Right lower leg: No edema.     Left lower leg: No edema.  Skin:    General: Skin is warm and dry.  Neurological:     Mental Status: He is alert and oriented to person, place, and time.     Comments: Fluent speech  Psychiatric:        Judgment: Judgment normal.     ED Results / Procedures / Treatments   Labs (all labs ordered are listed, but only abnormal results are displayed) Labs Reviewed  CBC - Abnormal; Notable for the following components:      Result Value   WBC 22.8 (*)    Hemoglobin 12.2 (*)    All other components within normal limits  COMPREHENSIVE METABOLIC PANEL - Abnormal; Notable for the following components:   Glucose, Bld 123 (*)    BUN 25 (*)    Creatinine, Ser 1.51 (*)    Albumin 3.1 (*)    AST 42 (*)    GFR calc non Af Amer 44 (*)    GFR calc Af Amer 51 (*)    All other components within normal limits  BRAIN NATRIURETIC PEPTIDE - Abnormal; Notable for the following components:   B Natriuretic Peptide 1,392.1 (*)    All other components within normal limits  TROPONIN I (HIGH SENSITIVITY) - Abnormal; Notable for the following components:   Troponin I (High Sensitivity) 562 (*)    All other components within normal limits  TROPONIN I (HIGH SENSITIVITY) - Abnormal; Notable for the following components:   Troponin I (High Sensitivity) 2,259 (*)    All other components within normal limits  SARS CORONAVIRUS 2 (TAT 6-24 HRS)  POC SARS CORONAVIRUS 2 AG -  ED  EKG EKG Interpretation  Date/Time:  Wednesday January 04 2019 11:40:58 EST Ventricular Rate:  93 PR Interval:  174 QRS Duration: 96 QT Interval:  334 QTC Calculation: 415 R Axis:   67 Text Interpretation: Normal sinus rhythm Incomplete right bundle branch block  Possible Lateral infarct , age undetermined Possible Inferior infarct , age undetermined Abnormal ECG artifact limits interpretation Confirmed by Theotis Burrow [phone]) on 01/04/2019 11:54:08 AM   Radiology DG Chest 2 View  Result Date: 01/04/2019 CLINICAL DATA:  Shortness of breath, history of recent cholecystectomy EXAM: CHEST - 2 VIEW COMPARISON:  11/02/2018 FINDINGS: Cardiac shadow is accentuated by the portable technique. Aortic calcifications are noted. Postsurgical changes are seen. Patchy infiltrates are identified bilaterally which are new from the prior exam. Some scarring in the left mid lung as well as chronic left effusion is noted. Common bile duct stents are noted on the margin of the film. Postsurgical changes in the cervical spine are seen. IMPRESSION: Bilateral infiltrative opacities new from the prior exam consistent with multifocal pneumonia. Chronic changes in the left base. Electronically Signed   By: Inez Catalina M.D.   On: 01/04/2019 13:05    Procedures Procedures (including critical care time) CRITICAL CARE Performed by: Wenda Overland Wentworth Edelen   Total critical care time: 30 minutes  Critical care time was exclusive of separately billable procedures and treating other patients.  Critical care was necessary to treat or prevent imminent or life-threatening deterioration.  Critical care was time spent personally by me on the following activities: development of treatment plan with patient and/or surrogate as well as nursing, discussions with consultants, evaluation of patient's response to treatment, examination of patient, obtaining history from patient or surrogate, ordering and performing treatments and interventions, ordering and review of laboratory studies, ordering and review of radiographic studies, pulse oximetry and re-evaluation of patient's condition.  Medications Ordered in ED Medications  sodium chloride flush (NS) 0.9 % injection 3 mL (3 mLs Intravenous Not  Given 01/04/19 1204)  aspirin chewable tablet 324 mg (324 mg Oral Given 01/04/19 1554)    ED Course  I have reviewed the triage vital signs and the nursing notes.  Pertinent labs & imaging results that were available during my care of the patient were reviewed by me and considered in my medical decision making (see chart for details).    MDM Rules/Calculators/A&P                       Patient was hypoxic and dyspneic without respiratory distress, able to transition to nasal cannula.  Differential includes PE given recent surgery or volume overload with pulmonary edema.  Less likely COVID-19 however possible given current pandemic.  Rapid Covid test negative, 6-hour test sent.  Lab work shows creatinine 1.5, initial troponin V 62, BNP elevated at 1300.  Chest x-ray shows patchy infiltrates suggestive of multifocal pneumonia but I question whether this is fluid.  WBC is 22.8.  I have ordered CTA of chest and I have also consulted cardiology as his repeat troponin is climbing at 2259.  I have given aspirin but have held off on nitroglycerin given patient's recent surgery and denial of any chest pain symptoms.  Patient signed out to oncoming provider pending completion of CTA, cardiology recommendations, and admission.  VANN MENDILLO was evaluated in Emergency Department on 01/04/2019 for the symptoms described in the history of present illness. He was evaluated in the context of the global COVID-19 pandemic, which necessitated consideration that  the patient might be at risk for infection with the SARS-CoV-2 virus that causes COVID-19. Institutional protocols and algorithms that pertain to the evaluation of patients at risk for COVID-19 are in a state of rapid change based on information released by regulatory bodies including the CDC and federal and state organizations. These policies and algorithms were followed during the patient's care in the ED.  Final Clinical Impression(s) / ED Diagnoses  Final diagnoses:  None    Rx / DC Orders ED Discharge Orders    None       Jakhari Space, Wenda Overland, MD 01/04/19 1610

## 2019-01-04 NOTE — Progress Notes (Signed)
I was called by his nurse in the ED regarding increasing troponins. I reviewed Dr. Kingsley Plan notes [facility] patient of Dr. Ellouise Newer) . In view of EKG changes and also continued elevation n troponins, saft to start Heparin until reevaluated in the morning. Patient without chest pain and hemodynamically stable. May need cardiac work up  Once stable and echo is pending.  Adrian Prows, MD, [facility] 01/04/2019, 11:10 PM [facility] [place] Pager: [phone] Office: [phone]

## 2019-01-04 NOTE — ED Notes (Signed)
Sent page to Tylene Fantasia about troponin increasing

## 2019-01-04 NOTE — ED Notes (Signed)
Patient transported to CT 

## 2019-01-04 NOTE — ED Triage Notes (Signed)
Pt here from home with c/o sob , that started this morning after he got up , pt had a lap chole done yesterday at [facility]

## 2019-01-04 NOTE — ED Provider Notes (Signed)
Pt signed out by Dr. Rex Kras pending CTA and cards consult.  Pt's CTA does not show PE, but shows multifocal pneumonia.  He was given rocephin/zithromax.  Pt is oxygenating well on 4L by [place].  If it comes off, his oxygen drops.  The pt was seen by cards who feel his elevated troponin is due to demand.  POC covid test negative, but PCR test is still pending.  Pt d/w Dr. Maylene Roes (triad) for admission.   Isla Pence, MD 01/04/19 [phone]

## 2019-01-04 NOTE — Anesthesia Postprocedure Evaluation (Signed)
Anesthesia Post Note  Patient: [name]  Procedure(s) Performed: XI ROBOTIC ASSISTED LAPAROSCOPIC CHOLECYSTECTOMY (N/A )     Patient location during evaluation: PACU Anesthesia Type: General Level of consciousness: awake and alert Pain management: pain level controlled Vital Signs Assessment: post-procedure vital signs reviewed and stable Respiratory status: spontaneous breathing, nonlabored ventilation, respiratory function stable and patient connected to nasal cannula oxygen Cardiovascular status: blood pressure returned to baseline and stable Postop Assessment: no apparent nausea or vomiting Anesthetic complications: no    Last Vitals:  Vitals:   01/02/19 1428 01/02/19 1504  BP: 105/66 137/64  Pulse:  73  Resp: 16 18  Temp: (!) 36.3 C (!) 36.2 C  SpO2: 95% 93%    Last Pain:  Vitals:   01/02/19 1504  TempSrc: Axillary  PainSc: 0-No pain                 Tyliyah Mcmeekin S

## 2019-01-04 NOTE — H&P (Signed)
History and Physical    [name] [id number] DOB: 08/26/1940 DOA: 01/04/2019  PCP: Isaac Bliss, Rayford Halsted, MD  Patient coming from: Home   Chief Complaint: SOB   HPI: [name] is a 78 y.o. male with medical history significant of coronary artery disease, COPD, hypertension, hyperlipidemia with recent cholecystectomy who presents with sudden onset of shortness of breath.  He had an uncomplicated surgical procedure 2 days ago, woke up this morning feeling short of breath and could not walk a short distance in his house.  He admits to feeling hot and cold but did not take a temperature at home, admits to a productive cough, shortness of breath but no chest pain, no nausea and vomiting, improved abdominal pain postsurgically, and continued diarrhea.  ED Course: Labs obtained which revealed creatinine stable at 1.5, BNP 1392, troponin  162 which subsequently elevated to 2259, WBC 22.8.  Covid antigen testing was negative, PCR pending.  CTA chest was negative for PE but did reveal multifocal pneumonia.  Cardiology also consulted for elevated troponin.  Review of Systems: As per HPI. Otherwise, all other review of systems reviewed and are negative.   Past Medical History:  Diagnosis Date  . Allergy   . B12 deficiency anemia   . Blood transfusion without reported diagnosis   . CAD (coronary artery disease)   . Cancer ([place])    bladder-   . Colon polyps   . COPD (chronic obstructive pulmonary disease) ([place])   . Depression   . Esophagus, Barrett's   . GERD (gastroesophageal reflux disease)   . History of bladder cancer    Bladder cancer "8 times"  . History of hiatal hernia   . Hyperlipidemia   . Hypertension   . Localized osteoarthrosis, lower leg   . Restless leg syndrome   . Sleep apnea    does not wear cpap  . Stenosis of esophagus     Past Surgical History:  Procedure Laterality Date  . BILIARY BRUSHING  04/01/2018   Procedure: BILIARY BRUSHING;  Surgeon:  Rush Landmark Telford Nab., MD;  Location: [place];  Service: Gastroenterology;;  . BILIARY BRUSHING  09/12/2018   Procedure: BILIARY BRUSHING;  Surgeon: Irving Copas., MD;  Location: [place];  Service: Gastroenterology;;  . BILIARY BRUSHING  11/28/2018   Procedure: BILIARY BRUSHING;  Surgeon: Irving Copas., MD;  Location: [place];  Service: Gastroenterology;;  . BILIARY DILATION  09/12/2018   Procedure: BILIARY DILATION;  Surgeon: Irving Copas., MD;  Location: [place];  Service: Gastroenterology;;  . BILIARY DILATION  11/28/2018   Procedure: BILIARY DILATION;  Surgeon: Irving Copas., MD;  Location: [place];  Service: Gastroenterology;;  . BILIARY STENT PLACEMENT  04/01/2018   Procedure: BILIARY STENT PLACEMENT;  Surgeon: Irving Copas., MD;  Location: [place];  Service: Gastroenterology;;  . BILIARY STENT PLACEMENT  09/12/2018   Procedure: BILIARY STENT PLACEMENT;  Surgeon: Irving Copas., MD;  Location: [place];  Service: Gastroenterology;;  . BILIARY STENT PLACEMENT  11/28/2018   Procedure: BILIARY STENT PLACEMENT;  Surgeon: Irving Copas., MD;  Location: [place];  Service: Gastroenterology;;  . BIOPSY  04/01/2018   Procedure: BIOPSY;  Surgeon: Irving Copas., MD;  Location: [place];  Service: Gastroenterology;;  . BIOPSY  09/12/2018   Procedure: BIOPSY;  Surgeon: Irving Copas., MD;  Location: [place];  Service: Gastroenterology;;  . BIOPSY  11/28/2018   Procedure: BIOPSY;  Surgeon: Irving Copas., MD;  Location: [facility]  ENDOSCOPY;  Service: Gastroenterology;;  . bladder cancer      x 8 cystoscopy  . CERVICAL DISCECTOMY     ACDF  . COLONOSCOPY  11/17/2005   normal   . CORONARY ARTERY BYPASS GRAFT     x4  . ENDOSCOPIC MUCOSAL RESECTION  09/12/2018   Procedure: ENDOSCOPIC MUCOSAL RESECTION;  Surgeon: Rush Landmark Telford Nab., MD;  Location: [facility] ENDOSCOPY;   Service: Gastroenterology;;  . ENDOSCOPIC RETROGRADE CHOLANGIOPANCREATOGRAPHY (ERCP) WITH PROPOFOL N/A 04/01/2018   Procedure: ENDOSCOPIC RETROGRADE CHOLANGIOPANCREATOGRAPHY (ERCP) WITH PROPOFOL;  Surgeon: Irving Copas., MD;  Location: [place];  Service: Gastroenterology;  Laterality: N/A;  . ENDOSCOPIC RETROGRADE CHOLANGIOPANCREATOGRAPHY (ERCP) WITH PROPOFOL N/A 09/12/2018   Procedure: ENDOSCOPIC RETROGRADE CHOLANGIOPANCREATOGRAPHY (ERCP) WITH PROPOFOL;  Surgeon: Rush Landmark Telford Nab., MD;  Location: [place];  Service: Gastroenterology;  Laterality: N/A;  . ERCP N/A 11/28/2018   Procedure: ENDOSCOPIC RETROGRADE CHOLANGIOPANCREATOGRAPHY (ERCP) +EGD with spyglass;  Surgeon: Rush Landmark Telford Nab., MD;  Location: [place];  Service: Gastroenterology;  Laterality: N/A;  . ESOPHAGOGASTRODUODENOSCOPY  04/29/2010  . ESOPHAGOGASTRODUODENOSCOPY (EGD) WITH PROPOFOL N/A 04/01/2018   Procedure: ESOPHAGOGASTRODUODENOSCOPY (EGD) WITH PROPOFOL;  Surgeon: Rush Landmark Telford Nab., MD;  Location: [place];  Service: Gastroenterology;  Laterality: N/A;  . ESOPHAGOGASTRODUODENOSCOPY (EGD) WITH PROPOFOL N/A 09/12/2018   Procedure: ESOPHAGOGASTRODUODENOSCOPY (EGD) WITH PROPOFOL;  Surgeon: Rush Landmark Telford Nab., MD;  Location: [place];  Service: Gastroenterology;  Laterality: N/A;  . ESOPHAGOGASTRODUODENOSCOPY (EGD) WITH PROPOFOL N/A 11/28/2018   Procedure: ESOPHAGOGASTRODUODENOSCOPY (EGD) WITH PROPOFOL;  Surgeon: Rush Landmark Telford Nab., MD;  Location: [place];  Service: Gastroenterology;  Laterality: N/A;  . EUS  04/01/2018   Procedure: FULL UPPER ENDOSCOPIC ULTRASOUND (EUS) RADIAL;  Surgeon: Irving Copas., MD;  Location: [place];  Service: Gastroenterology;;  . EUS N/A 09/12/2018   Procedure: UPPER ENDOSCOPIC ULTRASOUND (EUS) RADIAL;  Surgeon: Irving Copas., MD;  Location: [place];  Service: Gastroenterology;  Laterality: N/A;  . FINE NEEDLE ASPIRATION   09/12/2018   Procedure: FINE NEEDLE ASPIRATION (FNA) LINEAR;  Surgeon: Irving Copas., MD;  Location: [place];  Service: Gastroenterology;;  . HEMOSTASIS CLIP PLACEMENT  09/12/2018   Procedure: HEMOSTASIS CLIP PLACEMENT;  Surgeon: Irving Copas., MD;  Location: [facility] ENDOSCOPY;  Service: Gastroenterology;;  . I&D EXTREMITY Left 11/24/2016   Procedure: IRRIGATION AND DEBRIDEMENT LEFT HAND, THUMB, INDEX, MIDDLE, RING, AND SMALL FINGERS WITH RECONSTRUCTION;  Surgeon: Roseanne Kaufman, MD;  Location: [place];  Service: Orthopedics;  Laterality: Left;  . KNEE ARTHROSCOPY Left   . LUMBAR LAMINECTOMY     and fusion x 2  . NASAL SINUS SURGERY    . POPLITEAL SYNOVIAL CYST EXCISION    . REMOVAL OF STONES  04/01/2018   Procedure: REMOVAL OF STONES;  Surgeon: Rush Landmark Telford Nab., MD;  Location: [place];  Service: Gastroenterology;;  . REMOVAL OF STONES  09/12/2018   Procedure: REMOVAL OF STONES;  Surgeon: Irving Copas., MD;  Location: [place];  Service: Gastroenterology;;  . REMOVAL OF STONES  11/28/2018   Procedure: REMOVAL OF STONES;  Surgeon: Irving Copas., MD;  Location: [place];  Service: Gastroenterology;;  . Azzie Almas DILATION N/A 09/12/2018   Procedure: Azzie Almas DILATION;  Surgeon: Irving Copas., MD;  Location: [place];  Service: Gastroenterology;  Laterality: N/A;  . SAVORY DILATION N/A 11/28/2018   Procedure: SAVORY DILATION;  Surgeon: Rush Landmark Telford Nab., MD;  Location: [place];  Service: Gastroenterology;  Laterality: N/A;  . SPHINCTEROTOMY  04/01/2018   Procedure: SPHINCTEROTOMY;  Surgeon: Irving Copas., MD;  Location: [place] ENDOSCOPY;  Service: Gastroenterology;;  . Bess Kinds CHOLANGIOSCOPY N/A 11/28/2018   Procedure: [id number] CHOLANGIOSCOPY;  Surgeon: Irving Copas., MD;  Location: [place];  Service: Gastroenterology;  Laterality: N/A;  . STENT REMOVAL  09/12/2018   Procedure: STENT REMOVAL;  Surgeon:  Irving Copas., MD;  Location: [place];  Service: Gastroenterology;;  . Lavell Islam REMOVAL  11/28/2018   Procedure: STENT REMOVAL;  Surgeon: Irving Copas., MD;  Location: [place];  Service: Gastroenterology;;  . Lia Foyer LIFTING INJECTION  09/12/2018   Procedure: SUBMUCOSAL LIFTING INJECTION;  Surgeon: Irving Copas., MD;  Location: [place];  Service: Gastroenterology;;     reports that he quit smoking about 42 years ago. His smoking use included cigarettes. He has a 2.50 pack-year smoking history. He has never used smokeless tobacco. He reports that he does not drink alcohol or use drugs.  Allergies  Allergen Reactions  . Losartan Potassium Other (See Comments)    Hyperkalemia  . Tape Itching and Rash    reddened skin     Family History  Problem Relation Age of Onset  . Melanoma Mother   . Stroke Father   . Hypertension Father   . Coronary artery disease Other   . Colon cancer Neg Hx   . Esophageal cancer Neg Hx   . Stomach cancer Neg Hx   . Rectal cancer Neg Hx   . Pancreatic cancer Neg Hx   . Liver disease Neg Hx   . Inflammatory bowel disease Neg Hx      Prior to Admission medications   Medication Sig Start Date End Date Taking? Authorizing Provider  acetaminophen (TYLENOL) 325 MG tablet Take 650 mg by mouth every 6 (six) hours as needed for mild pain.   Yes [provider]  amLODipine (NORVASC) 2.5 MG tablet Take 1 tablet (2.5 mg total) by mouth daily. 10/10/18 01/08/19 Yes Troy Sine, MD  aspirin EC 81 MG tablet Take 81 mg by mouth at bedtime.   Yes [provider]  BYSTOLIC 10 MG tablet Take 10 mg by mouth daily. 07/02/18  Yes [provider]  cyanocobalamin (,VITAMIN B-12,) 1000 MCG/ML injection INJECT 1 ML (CC) INTRAMUSCULARLY FOR 1 DOSE Patient taking differently: Inject 1,000 mcg into the muscle every 30 (thirty) days. On the 15th of each month 12/24/17  Yes Isaac Bliss, Rayford Halsted, MD    dicyclomine (BENTYL) 10 MG capsule Take 1 capsule (10 mg total) by mouth every 8 (eight) hours as needed for spasms. 01/07/18  Yes Armbruster, Carlota Raspberry, MD  docusate sodium (COLACE) 100 MG capsule Take 1 capsule (100 mg total) by mouth 2 (two) times daily. OK to decrease to once daily or discontinue if having diarrhea 01/02/19 02/01/19 Yes Clovis Riley, MD  ezetimibe (ZETIA) 10 MG tablet Take 1 tablet (10 mg total) by mouth daily. 07/06/18  Yes Isaac Bliss, Rayford Halsted, MD  Fiber Complete TABS Take 1 tablet by mouth daily.   Yes [provider]  fluticasone (FLONASE) 50 MCG/ACT nasal spray Place 1-2 sprays into both nostrils at bedtime as needed for allergies or rhinitis.   Yes [provider]  folic acid (FOLVITE) [id number] MCG tablet Take 400 mcg by mouth daily.     Yes [provider]  isosorbide mononitrate (IMDUR) 60 MG 24 hr tablet Take 1 tablet (60 mg total) by mouth daily. 08/04/18  Yes Troy Sine, MD  Menthol, Topical Analgesic, (BENGAY EX) Apply 1 application topically daily as needed (pain).  Yes [provider]  Multiple Vitamins-Minerals (MULTIVITAMIN WITH MINERALS) tablet Take 1 tablet by mouth daily.   Yes [provider]  Naphazoline-Pheniramine (OPCON-A) 0.027-0.315 % SOLN Place 1 drop into both eyes daily as needed (itching eyes).   Yes [provider]  omeprazole (PRILOSEC) 40 MG capsule Take 1 capsule (40 mg total) by mouth 2 (two) times daily after a meal. 01/02/19 01/02/20 Yes Clovis Riley, MD  Probiotic Product (PROBIOTIC ADVANCED PO) Take 1 tablet by mouth 2 (two) times daily.    Yes [provider]  rOPINIRole (REQUIP) 2 MG tablet TAKE 1 TABLET BY MOUTH AT BEDTIME Patient taking differently: Take 2 mg by mouth at bedtime.  11/01/18  Yes Isaac Bliss, Rayford Halsted, MD  rosuvastatin (CRESTOR) 20 MG tablet Take 20 mg by mouth daily.   Yes [provider]  traMADol (ULTRAM) 50 MG tablet Take 1  tablet (50 mg total) by mouth every 6 (six) hours as needed. 01/02/19 01/02/20 Yes Clovis Riley, MD  venlafaxine (EFFEXOR) 75 MG tablet Take 1 tablet (75 mg total) by mouth daily. 12/31/17  Yes Erline Hau, MD  NEEDLE, DISP, 25 G (B-D DISP NEEDLE 25GX1") 25G X 1" MISC Inject 1000 mcg into muscle once a month. 12/24/17   Isaac Bliss, Rayford Halsted, MD  nitroGLYCERIN (NITROSTAT) 0.4 MG SL tablet Place 1 tablet (0.4 mg total) under the tongue every 5 (five) minutes as needed for chest pain. 10/05/18   Isaac Bliss, Rayford Halsted, MD  pantoprazole (PROTONIX) 40 MG tablet Take 30- 60 min before your first and last meals of the day Patient not taking: Reported on 01/04/2019 07/27/18   Tanda Rockers, MD    Physical Exam: Vitals:   01/04/19 1545 01/04/19 1615 01/04/19 1701 01/04/19 1702  BP: (!) 152/86 (!) 141/60 124/77   Pulse: 97 (!) 102 (!) 105 95  Resp: (!) 32 (!) 31 (!) 32 (!) 27  Temp:      TempSrc:      SpO2: 94% 94% (!) 89% 92%     Constitutional: NAD, calm, comfortable Eyes: PERRL, lids and conjunctivae normal ENMT: Deferred as patient was wearing a facemask Respiratory: Diffuse crackles bilaterally, no wheezing, normal respiratory effort without accessory muscle use or distress, no conversational dyspnea, on 4 L nasal cannula O2  Cardiovascular: Regular rate and rhythm, no murmurs. No extremity edema.  Abdomen: Soft, nondistended, nontender to palpation. Bowel sounds positive.  Musculoskeletal: No joint deformity upper and lower extremities. No contractures. Normal muscle tone.  Skin: no rashes, lesions, ulcers on exposed skin  Neurologic: Alert and oriented, speech fluent, CN 2-12 grossly intact. No focal deficits.   Psychiatric: Normal judgment and insight. Normal mood and affect   Labs on Admission: I have personally reviewed following labs and imaging studies  CBC: Recent Labs  Lab 12/29/18 1200 01/04/19 1153  WBC 10.4 22.8*  NEUTROABS 7.5  --   HGB  12.4* 12.2*  HCT 39.5 40.5  MCV 88.4 93.1  PLT 192 [id number]   Basic Metabolic Panel: Recent Labs  Lab 12/29/18 1200 01/04/19 1327  NA 141 141  K 4.1 3.9  CL 104 103  CO2 29 27  GLUCOSE 162* 123*  BUN 19 25*  CREATININE 1.40* 1.51*  CALCIUM 8.8* 9.1   GFR: Estimated Creatinine Clearance: 41.6 mL/min (A) (by C-G formula based on SCr of 1.51 mg/dL (H)). Liver Function Tests: Recent Labs  Lab 12/29/18 1200 01/04/19 1327  AST 21 42*  ALT 14  25  ALKPHOS 64 66  BILITOT 0.8 0.8  PROT 7.5 7.6  ALBUMIN 3.8 3.1*   No results for input(s): LIPASE, AMYLASE in the last 168 hours. No results for input(s): AMMONIA in the last 168 hours. Coagulation Profile: No results for input(s): INR, PROTIME in the last 168 hours. Cardiac Enzymes: No results for input(s): CKTOTAL, CKMB, CKMBINDEX, TROPONINI in the last 168 hours. BNP (last 3 results) Recent Labs    07/27/18 1618  PROBNP 150.0*   HbA1C: No results for input(s): HGBA1C in the last 72 hours. CBG: No results for input(s): GLUCAP in the last 168 hours. Lipid Profile: No results for input(s): CHOL, HDL, LDLCALC, TRIG, CHOLHDL, LDLDIRECT in the last 72 hours. Thyroid Function Tests: No results for input(s): TSH, T4TOTAL, FREET4, T3FREE, THYROIDAB in the last 72 hours. Anemia Panel: No results for input(s): VITAMINB12, FOLATE, FERRITIN, TIBC, IRON, RETICCTPCT in the last 72 hours. Urine analysis:    Component Value Date/Time   COLORURINE AMBER (A) 03/30/2018 2045   APPEARANCEUR CLEAR 03/30/2018 2045   LABSPEC 1.014 03/30/2018 2045   PHURINE 5.0 03/30/2018 2045   GLUCOSEU NEGATIVE 03/30/2018 2045   HGBUR NEGATIVE 03/30/2018 2045   BILIRUBINUR SMALL (A) 03/30/2018 2045   BILIRUBINUR 3 06/21/2015 0838   KETONESUR NEGATIVE 03/30/2018 2045   PROTEINUR 30 (A) 03/30/2018 2045   UROBILINOGEN 0.2 06/21/2015 0838   NITRITE NEGATIVE 03/30/2018 2045   LEUKOCYTESUR NEGATIVE 03/30/2018 2045   Sepsis Labs:  !!!!!!!!!!!!!!!!!!!!!!!!!!!!!!!!!!!!!!!!!!!! @LABRCNTIP (procalcitonin:4,lacticidven:4) ) Recent Results (from the past 240 hour(s))  Novel Coronavirus, NAA (Hosp order, Send-out to [facility]; TAT 18-24 hrs     Status: None   Collection Time: 12/29/18  4:17 PM   Specimen: Nasopharyngeal Swab; Respiratory  Result Value Ref Range Status   SARS-CoV-2, NAA NOT DETECTED NOT DETECTED Final    Comment: (NOTE) This nucleic acid amplification test was developed and its performance characteristics determined by [employer]. Nucleic acid amplification tests include PCR and TMA. This test has not been FDA cleared or approved. This test has been authorized by FDA under an Emergency Use Authorization (EUA). This test is only authorized for the duration of time the declaration that circumstances exist justifying the authorization of the emergency use of in vitro diagnostic tests for detection of SARS-CoV-2 virus and/or diagnosis of COVID-19 infection under section 564(b)(1) of the Act, 21 U.S.C. [id number]) (1), unless the authorization is terminated or revoked sooner. When diagnostic testing is negative, the possibility of a false negative result should be considered in the context of a patient's recent exposures and the presence of clinical signs and symptoms consistent with COVID-19. An individual without symptoms of COVID- 19 and who is not shedding SARS-CoV-2 vi rus would expect to have a negative (not detected) result in this assay. Performed At: [facility] [address] [place], [place] [id number] Rush Farmer MD [id number]    [place]  Final    Comment: Performed at [facility], [place] [address] [place], [place] [address]     Radiological Exams on Admission: DG Chest 2 View  Result Date: 01/04/2019 CLINICAL DATA:  Shortness of breath, history of recent cholecystectomy EXAM: CHEST - 2 VIEW COMPARISON:  11/02/2018 FINDINGS: Cardiac  shadow is accentuated by the portable technique. Aortic calcifications are noted. Postsurgical changes are seen. Patchy infiltrates are identified bilaterally which are new from the prior exam. Some scarring in the left mid lung as well as chronic left effusion is noted. Common bile duct stents are noted  on the margin of the film. Postsurgical changes in the cervical spine are seen. IMPRESSION: Bilateral infiltrative opacities new from the prior exam consistent with multifocal pneumonia. Chronic changes in the left base. Electronically Signed   By: Inez Catalina M.D.   On: 01/04/2019 13:05   CT Angio Chest PE W/Cm &/Or Wo Cm  Result Date: 01/04/2019 CLINICAL DATA:  Shortness of breath. Recent surgery. Evaluate for pulmonary embolus. EXAM: CT ANGIOGRAPHY CHEST WITH CONTRAST TECHNIQUE: Multidetector CT imaging of the chest was performed using the standard protocol during bolus administration of intravenous contrast. Multiplanar CT image reconstructions and MIPs were obtained to evaluate the vascular anatomy. CONTRAST:  132mL OMNIPAQUE IOHEXOL 350 MG/ML SOLN COMPARISON:  CT chest 04/04/2010 FINDINGS: Cardiovascular: Previous median sternotomy and CABG procedure. Heart size normal. No pericardial effusion. Aortic atherosclerosis. The main pulmonary artery appears patent. There is no central obstructing pulmonary emboli identified bilaterally. No lobar or segmental pulmonary artery filling defects identified. Mediastinum/Nodes: The trachea appears patent and is midline. Normal appearance of the thyroid gland. Normal appearance of the esophagus. No enlarged mediastinal or hilar adenopathy. Lungs/Pleura: Mild diffuse pleural thickening overlying the left lung is identified. Multifocal bilateral airspace and ground-glass densities are noted throughout both lungs. These findings correspond with the chest radiograph findings from earlier today and are favored to represent multifocal pneumonia. Upper Abdomen: Postoperative  changes within the upper abdomen are identified. Biliary drain is noted. Pneumoperitoneum is identified within the upper abdomen, likely postoperative. Musculoskeletal: No chest wall abnormality. No acute or significant osseous findings. Review of the MIP images confirms the above findings. IMPRESSION: 1. No evidence for acute pulmonary embolus. 2. Extensive bilateral ground-glass and airspace opacities are noted throughout both lungs concerning for multi focal pneumonia. 3. Mild diffuse pleural thickening overlies the left lung with decrease in volume of left pleural effusion. Aortic Atherosclerosis (ICD10-I70.0). Electronically Signed   By: Kerby Moors M.D.   On: 01/04/2019 16:59    EKG: Independently reviewed. Sinus tachycardia   Assessment/Plan Principal Problem:   Acute hypoxemic respiratory failure (HCC) Active Problems:   Depression   Essential hypertension   Restless leg syndrome   Hx of CABG   Hyperlipidemia LDL goal <70   GERD (gastroesophageal reflux disease)   CKD (chronic kidney disease), stage III   CAD (coronary artery disease)   Multifocal pneumonia   Severe sepsis (HCC)   Elevated troponin   Acute on chronic diastolic (congestive) heart failure (HCC)    Acute hypoxemic respiratory failure -CTA chest negative for PE, but did show extensive bilateral ground-glass and airspace opacities are noted throughout both lungs concerning for multi focal pneumonia. -Required NRB at presentation to ED, now weaned to 4L [place] O2  Severe sepsis secondary to multifocal pneumonia, sepsis POA, with elevated troponin and respiratory failure  -POC Covid Ag test negative, PCR pending  -Rocephin/Azithromycin   Elevated troponin secondary to demand ischemia CAD s/p CABG  -Appreciate cardiology consult  -Continue aspirin, Imdur  Acute on chronic diastolic HF -Appreciate cardiology consult  -Repeat echo pending -Lasix   HTN -Amlodipine, Bystolic  HLD -Crestor, Zetia  CKD stage  3b  -Baseline Cr ~1.6-1.7 -Stable   S/p cholecystectomy  -Stable   Depression -Continue Effexor  GERD, hx Barrett's esophagus  -PPI    DVT prophylaxis: Subcutaneous heparin Code Status: Full code Family Communication: No family at bedside Disposition Plan: Pending clinical improvement of respiratory failure, sepsis, multifocal pneumonia and further cardiology evaluation Consults called: Cardiology   Admission status: Inpatient   *  I certify that at the point of admission it is my clinical judgment that the patient will require inpatient hospital care spanning beyond 2 midnights from the point of admission due to high intensity of service, high risk for further deterioration and high frequency of surveillance required.Dessa Phi, DO [facility] 01/04/2019, 5:24 PM   Available via Epic secure chat 7am-7pm After these hours, please refer to coverage provider listed on [email or web address]

## 2019-01-04 NOTE — ED Notes (Addendum)
Patient titrated off of NRB and placed on 3L O2 via n.c. pt tolerating  well, O2 sats remain around 95%

## 2019-01-04 NOTE — ED Notes (Signed)
Patient transported to X-ray 

## 2019-01-04 NOTE — ED Notes (Signed)
Pt is NSR on monitor 

## 2019-01-04 NOTE — ED Notes (Signed)
Pt removed O2 [place], O2 sat 89%. RN replaced [place] and pt O2 sat now 94%

## 2019-01-04 NOTE — Consult Note (Addendum)
Cardiology Consultation:   Patient ID: UWE KOIS MRN: [id number]; DOB: 12-28-1940  Admit date: 01/04/2019 Date of Consult: 01/04/2019  Primary Care Provider: Isaac Bliss, Rayford Halsted, MD Primary Cardiologist: [name], MD  Primary Electrophysiologist:  None    Patient Profile:   [name] is a 78 y.o. 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 Rex Kras.  History of Present Illness:   [name] follows with Dr. Claiborne Billings for the above cardiac issues. He underwent CABG  In 2001 with LIMA to [place], SVG to marginal, and SVG to RCA. An echo in 2012 showed LVEF 40-50% with moderate septal hypokinesis and G1DD and AS. Catheterization in 2014 showed patent grafts. He had a nuclear stress test in 2016 which showed an LVEF 54% and normal perfusion. He had an echo in 2018 showing normal LV function and G2DD as well as AS without stenosis, mild LA dilation and trivial PR. In 2019 he showed significant decline is in his physical ability. He had a Lexiscan myoview 12/2017 showed LVEF 51% and no ischemia. He was continued on Zetia and Rosuvastatin for HLD. HCTZ for HTN.   He was seen in March by cardiology for a preo-op clearance for cholecystectomy. He ended up undergoing ERCP with sphincterotomy and stent placement. Patient was still having GI issues post -procedure. He followed up with cardiology was doing well from a cardiac stand point.   He saw Dr. Martin Majestic (pulmonology) for lower extremity dopplers which were negative for DVT. He also was noted to have a left pleural effusion which was felt to have been permanent pleural scarring.   The patient was last seen in the office 10/10/18 by Dr. Claiborne Billings. He has h/o of chronic with no acute changes. No chest pain.  On 01/02/19  patient underwent laparoscopic cholecystectomy at [facility] and was discharged in stable condition. COVID was negative prior to surgery.   The patient arrived to the ED 01/04/19 for shortness of breath. When he woke up this morning he felt sudden onset shortness of breath. He called his son-in-law who brought him to the ED. In the ED he was found to be hypoxic and placed on NRB. He was weaned off NRB to  at 3L O2. BP 152/86, pulse 97, afebrile, RR 32.  Labs showed potassium 3.9,  Glucose 123, creatinine 1.51, BUN 25, albumin 3.1, AST 41. WBC 22, Hgb 12.2. Lactic acid normal. BNP 1,392. HS troponin elevated 562 > 2,259. EKG showed NSR with iRBBB, with minimal STE in lateral leads. CXR showed bilateral infiltrative opacities consistent with multifocal pneumonia. CTA chest was ordered.   Patient is currently on 4L O2 and is working to breath. Patient does see a pulmonologist for his chronic SOB but has not been diagnosed with COPD. Denies chest pain. Denies tobacco/alcohol/drug use. He lives alone and is able to care for himself.    Heart Pathway Score:     Past Medical History:  Diagnosis Date   Allergy    B12 deficiency anemia    Blood transfusion without reported diagnosis    CAD (coronary artery disease)    Cancer (HCC)    bladder-    Colon polyps    COPD (chronic obstructive pulmonary disease) (HCC)    Depression    Esophagus, Barrett's  GERD (gastroesophageal reflux disease)    History of bladder cancer    Bladder cancer "8 times"   History of hiatal hernia    Hyperlipidemia    Hypertension    Localized osteoarthrosis, lower leg    Restless leg syndrome    Sleep apnea    does not wear cpap   Stenosis of esophagus     Past Surgical History:  Procedure Laterality Date   BILIARY BRUSHING  04/01/2018   Procedure: BILIARY BRUSHING;  Surgeon: Irving Copas., MD;  Location: [facility] ENDOSCOPY;  Service: Gastroenterology;;   BILIARY BRUSHING  09/12/2018   Procedure:  BILIARY BRUSHING;  Surgeon: Irving Copas., MD;  Location: [place];  Service: Gastroenterology;;   BILIARY BRUSHING  11/28/2018   Procedure: BILIARY BRUSHING;  Surgeon: Irving Copas., MD;  Location: [place];  Service: Gastroenterology;;   BILIARY DILATION  09/12/2018   Procedure: BILIARY DILATION;  Surgeon: Irving Copas., MD;  Location: [place];  Service: Gastroenterology;;   BILIARY DILATION  11/28/2018   Procedure: BILIARY DILATION;  Surgeon: Irving Copas., MD;  Location: [place];  Service: Gastroenterology;;   BILIARY STENT PLACEMENT  04/01/2018   Procedure: BILIARY STENT PLACEMENT;  Surgeon: Irving Copas., MD;  Location: [place];  Service: Gastroenterology;;   BILIARY STENT PLACEMENT  09/12/2018   Procedure: BILIARY STENT PLACEMENT;  Surgeon: Irving Copas., MD;  Location: [place];  Service: Gastroenterology;;   BILIARY STENT PLACEMENT  11/28/2018   Procedure: BILIARY STENT PLACEMENT;  Surgeon: Irving Copas., MD;  Location: [place];  Service: Gastroenterology;;   BIOPSY  04/01/2018   Procedure: BIOPSY;  Surgeon: Irving Copas., MD;  Location: [place];  Service: Gastroenterology;;   BIOPSY  09/12/2018   Procedure: BIOPSY;  Surgeon: Irving Copas., MD;  Location: [place];  Service: Gastroenterology;;   BIOPSY  11/28/2018   Procedure: BIOPSY;  Surgeon: Irving Copas., MD;  Location: [facility] ENDOSCOPY;  Service: Gastroenterology;;   bladder cancer      x 8 cystoscopy   CERVICAL DISCECTOMY     ACDF   COLONOSCOPY  11/17/2005   normal    CORONARY ARTERY BYPASS GRAFT     x4   ENDOSCOPIC MUCOSAL RESECTION  09/12/2018   Procedure: ENDOSCOPIC MUCOSAL RESECTION;  Surgeon: Irving Copas., MD;  Location: [facility] ENDOSCOPY;  Service: Gastroenterology;;   ENDOSCOPIC RETROGRADE CHOLANGIOPANCREATOGRAPHY (ERCP) WITH PROPOFOL N/A 04/01/2018   Procedure:  ENDOSCOPIC RETROGRADE CHOLANGIOPANCREATOGRAPHY (ERCP) WITH PROPOFOL;  Surgeon: Irving Copas., MD;  Location: [place];  Service: Gastroenterology;  Laterality: N/A;   ENDOSCOPIC RETROGRADE CHOLANGIOPANCREATOGRAPHY (ERCP) WITH PROPOFOL N/A 09/12/2018   Procedure: ENDOSCOPIC RETROGRADE CHOLANGIOPANCREATOGRAPHY (ERCP) WITH PROPOFOL;  Surgeon: Rush Landmark Telford Nab., MD;  Location: [place];  Service: Gastroenterology;  Laterality: N/A;   ERCP N/A 11/28/2018   Procedure: ENDOSCOPIC RETROGRADE CHOLANGIOPANCREATOGRAPHY (ERCP) +EGD with spyglass;  Surgeon: Rush Landmark Telford Nab., MD;  Location: [place];  Service: Gastroenterology;  Laterality: N/A;   ESOPHAGOGASTRODUODENOSCOPY  04/29/2010   ESOPHAGOGASTRODUODENOSCOPY (EGD) WITH PROPOFOL N/A 04/01/2018   Procedure: ESOPHAGOGASTRODUODENOSCOPY (EGD) WITH PROPOFOL;  Surgeon: Rush Landmark Telford Nab., MD;  Location: [place];  Service: Gastroenterology;  Laterality: N/A;   ESOPHAGOGASTRODUODENOSCOPY (EGD) WITH PROPOFOL N/A 09/12/2018   Procedure: ESOPHAGOGASTRODUODENOSCOPY (EGD) WITH PROPOFOL;  Surgeon: Rush Landmark Telford Nab., MD;  Location: [place];  Service: Gastroenterology;  Laterality: N/A;   ESOPHAGOGASTRODUODENOSCOPY (EGD) WITH PROPOFOL N/A 11/28/2018   Procedure: ESOPHAGOGASTRODUODENOSCOPY (EGD) WITH PROPOFOL;  Surgeon: Rush Landmark Telford Nab., MD;  Location: [place];  Service: Gastroenterology;  Laterality: N/A;   EUS  04/01/2018   Procedure: FULL UPPER ENDOSCOPIC ULTRASOUND (EUS) RADIAL;  Surgeon: Irving Copas., MD;  Location: [place];  Service: Gastroenterology;;   EUS N/A 09/12/2018   Procedure: UPPER ENDOSCOPIC ULTRASOUND (EUS) RADIAL;  Surgeon: Irving Copas., MD;  Location: [place];  Service: Gastroenterology;  Laterality: N/A;   FINE NEEDLE ASPIRATION  09/12/2018   Procedure: FINE NEEDLE ASPIRATION (FNA) LINEAR;  Surgeon: Irving Copas., MD;  Location: [place];   Service: Gastroenterology;;   HEMOSTASIS CLIP PLACEMENT  09/12/2018   Procedure: HEMOSTASIS CLIP PLACEMENT;  Surgeon: Irving Copas., MD;  Location: [facility] ENDOSCOPY;  Service: Gastroenterology;;   I&D EXTREMITY Left 11/24/2016   Procedure: IRRIGATION AND DEBRIDEMENT LEFT HAND, THUMB, INDEX, MIDDLE, RING, AND SMALL FINGERS WITH RECONSTRUCTION;  Surgeon: Roseanne Kaufman, MD;  Location: [place];  Service: Orthopedics;  Laterality: Left;   KNEE ARTHROSCOPY Left    LUMBAR LAMINECTOMY     and fusion x 2   NASAL SINUS SURGERY     POPLITEAL SYNOVIAL CYST EXCISION     REMOVAL OF STONES  04/01/2018   Procedure: REMOVAL OF STONES;  Surgeon: Rush Landmark Telford Nab., MD;  Location: [place];  Service: Gastroenterology;;   REMOVAL OF STONES  09/12/2018   Procedure: REMOVAL OF STONES;  Surgeon: Irving Copas., MD;  Location: [place];  Service: Gastroenterology;;   REMOVAL OF STONES  11/28/2018   Procedure: REMOVAL OF STONES;  Surgeon: Irving Copas., MD;  Location: [place];  Service: Gastroenterology;;   Azzie Almas DILATION N/A 09/12/2018   Procedure: Azzie Almas DILATION;  Surgeon: Irving Copas., MD;  Location: [place];  Service: Gastroenterology;  Laterality: N/A;   SAVORY DILATION N/A 11/28/2018   Procedure: SAVORY DILATION;  Surgeon: Rush Landmark Telford Nab., MD;  Location: [place];  Service: Gastroenterology;  Laterality: N/A;   SPHINCTEROTOMY  04/01/2018   Procedure: SPHINCTEROTOMY;  Surgeon: Mansouraty, Telford Nab., MD;  Location: [place];  Service: Gastroenterology;;   Bess Kinds CHOLANGIOSCOPY N/A 11/28/2018   Procedure: [id number] CHOLANGIOSCOPY;  Surgeon: Irving Copas., MD;  Location: [place];  Service: Gastroenterology;  Laterality: N/A;   STENT REMOVAL  09/12/2018   Procedure: STENT REMOVAL;  Surgeon: Irving Copas., MD;  Location: [place];  Service: Gastroenterology;;   Lavell Islam REMOVAL  11/28/2018   Procedure:  STENT REMOVAL;  Surgeon: Irving Copas., MD;  Location: [place];  Service: Gastroenterology;;   SUBMUCOSAL LIFTING INJECTION  09/12/2018   Procedure: SUBMUCOSAL LIFTING INJECTION;  Surgeon: Irving Copas., MD;  Location: [place];  Service: Gastroenterology;;     Home Medications:  Prior to Admission medications   Medication Sig Start Date End Date Taking? Authorizing Provider  acetaminophen (TYLENOL) 325 MG tablet Take 650 mg by mouth every 6 (six) hours as needed for mild pain.   Yes [provider]  amLODipine (NORVASC) 2.5 MG tablet Take 1 tablet (2.5 mg total) by mouth daily. 10/10/18 01/08/19 Yes Troy Sine, MD  aspirin EC 81 MG tablet Take 81 mg by mouth at bedtime.   Yes [provider]  BYSTOLIC 10 MG tablet Take 10 mg by mouth daily. 07/02/18  Yes [provider]  cyanocobalamin (,VITAMIN B-12,) 1000 MCG/ML injection INJECT 1 ML (CC) INTRAMUSCULARLY FOR 1 DOSE Patient taking differently: Inject 1,000 mcg into the muscle every 30 (thirty) days. On the 15th of each month 12/24/17  Yes Isaac Bliss, Rayford Halsted, MD  dicyclomine (BENTYL) 10 MG capsule Take 1 capsule (  10 mg total) by mouth every 8 (eight) hours as needed for spasms. 01/07/18  Yes Armbruster, Carlota Raspberry, MD  docusate sodium (COLACE) 100 MG capsule Take 1 capsule (100 mg total) by mouth 2 (two) times daily. OK to decrease to once daily or discontinue if having diarrhea 01/02/19 02/01/19 Yes Clovis Riley, MD  ezetimibe (ZETIA) 10 MG tablet Take 1 tablet (10 mg total) by mouth daily. 07/06/18  Yes Isaac Bliss, Rayford Halsted, MD  Fiber Complete TABS Take 1 tablet by mouth daily.   Yes [provider]  fluticasone (FLONASE) 50 MCG/ACT nasal spray Place 1-2 sprays into both nostrils at bedtime as needed for allergies or rhinitis.   Yes [provider]  folic acid (FOLVITE) A999333 MCG tablet Take 400 mcg by mouth daily.     Yes [provider]    isosorbide mononitrate (IMDUR) 60 MG 24 hr tablet Take 1 tablet (60 mg total) by mouth daily. 08/04/18  Yes Troy Sine, MD  Menthol, Topical Analgesic, (BENGAY EX) Apply 1 application topically daily as needed (pain).   Yes [provider]  Multiple Vitamins-Minerals (MULTIVITAMIN WITH MINERALS) tablet Take 1 tablet by mouth daily.   Yes [provider]  Naphazoline-Pheniramine (OPCON-A) 0.027-0.315 % SOLN Place 1 drop into both eyes daily as needed (itching eyes).   Yes [provider]  omeprazole (PRILOSEC) 40 MG capsule Take 1 capsule (40 mg total) by mouth 2 (two) times daily after a meal. 01/02/19 01/02/20 Yes Clovis Riley, MD  Probiotic Product (PROBIOTIC ADVANCED PO) Take 1 tablet by mouth 2 (two) times daily.    Yes [provider]  rOPINIRole (REQUIP) 2 MG tablet TAKE 1 TABLET BY MOUTH AT BEDTIME Patient taking differently: Take 2 mg by mouth at bedtime.  11/01/18  Yes Isaac Bliss, Rayford Halsted, MD  rosuvastatin (CRESTOR) 20 MG tablet Take 20 mg by mouth daily.   Yes [provider]  traMADol (ULTRAM) 50 MG tablet Take 1 tablet (50 mg total) by mouth every 6 (six) hours as needed. 01/02/19 01/02/20 Yes Clovis Riley, MD  venlafaxine (EFFEXOR) 75 MG tablet Take 1 tablet (75 mg total) by mouth daily. 12/31/17  Yes Erline Hau, MD  NEEDLE, DISP, 25 G (B-D DISP NEEDLE 25GX1") 25G X 1" MISC Inject 1000 mcg into muscle once a month. 12/24/17   Isaac Bliss, Rayford Halsted, MD  nitroGLYCERIN (NITROSTAT) 0.4 MG SL tablet Place 1 tablet (0.4 mg total) under the tongue every 5 (five) minutes as needed for chest pain. 10/05/18   Isaac Bliss, Rayford Halsted, MD  pantoprazole (PROTONIX) 40 MG tablet Take 30- 60 min before your first and last meals of the day Patient not taking: Reported on 01/04/2019 07/27/18   Tanda Rockers, MD    Inpatient Medications: Scheduled Meds:  aspirin  324 mg Oral Once   sodium chloride flush  3 mL  Intravenous Once   Continuous Infusions:  PRN Meds:   Allergies:    Allergies  Allergen Reactions   Losartan Potassium Other (See Comments)    Hyperkalemia   Tape Itching and Rash    reddened skin     Social History:   Social History   Socioeconomic History   Marital status: Married    Spouse name: Not on file   Number of children: Not on file   Years of education: Not on file   Highest education level: Not on file  Occupational History   Not on file  Tobacco Use   Smoking status: Former Smoker    Packs/day: 0.50    Years: 5.00    Pack years: 2.50    Types: Cigarettes    Quit date: 03/30/1976    Years since quitting: 42.7   Smokeless tobacco: Never Used  Substance and Sexual Activity   Alcohol use: No    Alcohol/week: 0.0 standard drinks   Drug use: No   Sexual activity: Yes  Other Topics Concern   Not on file  Social History Narrative   Not on file   Social Determinants of Health   Financial Resource Strain:    Difficulty of Paying Living Expenses: Not on file  Food Insecurity:    Worried About Charity fundraiser in the Last Year: Not on file   [employer] of Food in the Last Year: Not on file  Transportation Needs:    Lack of Transportation (Medical): Not on file   Lack of Transportation (Non-Medical): Not on file  Physical Activity:    Days of Exercise per Week: Not on file   Minutes of Exercise per Session: Not on file  Stress:    Feeling of Stress : Not on file  Social Connections:    Frequency of Communication with Friends and Family: Not on file   Frequency of Social Gatherings with Friends and Family: Not on file   Attends Religious Services: Not on file   Active Member of Clubs or Organizations: Not on file   Attends Archivist Meetings: Not on file   Marital Status: Not on file  Intimate Partner Violence:    Fear of Current or Ex-Partner: Not on file   Emotionally Abused: Not on file   Physically  Abused: Not on file   Sexually Abused: Not on file    Family History:   Family History  Problem Relation Age of Onset   Melanoma Mother    Stroke Father    Hypertension Father    Coronary artery disease Other    Colon cancer Neg Hx    Esophageal cancer Neg Hx    Stomach cancer Neg Hx    Rectal cancer Neg Hx    Pancreatic cancer Neg Hx    Liver disease Neg Hx    Inflammatory bowel disease Neg Hx      ROS:  Please see the history of present illness.  All other ROS reviewed and negative.     Physical Exam/Data:   Vitals:   01/04/19 1330 01/04/19 1415 01/04/19 1430 01/04/19 1504  BP: (!) 150/79 (!) 145/78 129/69   Pulse: (!) 102   (!) 101  Resp: (!) 31     Temp:      TempSrc:      SpO2: 95%   94%   No intake or output data in the 24 hours ending 01/04/19 1549 Last 3 Weights 01/02/2019 12/29/2018 11/28/2018  Weight (lbs) 180 lb 178 lb 181 lb  Weight (kg) 81.647 kg 80.74 kg 82.101 kg     There is no height or weight on file to calculate BMI.  General:  [name] in no mild respiratory distress HEENT: normal Lymph: no adenopathy Neck: no JVD Endocrine:  No thryomegaly Vascular: No carotid bruits; FA pulses 2+ bilaterally without bruits  Cardiac:  normal S1, S2; RRR; no murmur  Lungs:  Diffuse rales/rhonchii; 4L O2 Abd: soft, nontender, no hepatomegaly  Ext: no edema Musculoskeletal:  No deformities, BUE and BLE strength normal and equal Skin: warm and dry  Neuro:  CNs 2-12 intact, no focal abnormalities noted Psych:  Normal affect   EKG:  The EKG was personally reviewed and demonstrates:  NSR, iRBBB, minimal ST elevation lateral leads, q waves infero/lateral leads Telemetry:  Telemetry was personally reviewed and demonstrates:  NSR, He 90s, rare PVC  Relevant CV Studies:  Myoview 12/2017  The left ventricular ejection fraction is mildly decreased (45-54%).  Nuclear stress EF: 51%.  This is a low risk study.   Normal resting and stress perfusion.  No ischemia or infarction EF Estimated at 51% apex appears hypokinetic RV appears prominent   Echo 2019 Study Conclusions  - Left ventricle: The cavity size was normal. There was mild   concentric hypertrophy. Systolic function was normal. The   estimated ejection fraction was in the range of 60% to 65%. Wall   motion was normal; there were no regional wall motion   abnormalities. Doppler parameters are consistent with abnormal   left ventricular relaxation (grade 1 diastolic dysfunction). - Aortic valve: Trileaflet; mildly thickened, mildly calcified   leaflets. There was trivial regurgitation. - Mitral valve: There was no significant regurgitation. - Left atrium: The atrium was mildly dilated. - Tricuspid valve: There was no significant regurgitation. - Pulmonic valve: There was no significant regurgitation. - Inferior vena cava: The vessel was normal in size. The   respirophasic diameter changes were in the normal range (= 50%),   consistent with normal central venous pressure.   Laboratory Data:  High Sensitivity Troponin:   Recent Labs  Lab 01/04/19 1153 01/04/19 1327  TROPONINIHS 562* 2,259*     Chemistry Recent Labs  Lab 12/29/18 1200 01/04/19 1327  NA 141 141  K 4.1 3.9  CL 104 103  CO2 29 27  GLUCOSE 162* 123*  BUN 19 25*  CREATININE 1.40* 1.51*  CALCIUM 8.8* 9.1  GFRNONAA 48* 44*  GFRAA 56* 51*  ANIONGAP 8 11    Recent Labs  Lab 12/29/18 1200 01/04/19 1327  PROT 7.5 7.6  ALBUMIN 3.8 3.1*  AST 21 42*  ALT 14 25  ALKPHOS 64 66  BILITOT 0.8 0.8   Hematology Recent Labs  Lab 12/29/18 1200 01/04/19 1153  WBC 10.4 22.8*  RBC 4.47 4.35  HGB 12.4* 12.2*  HCT 39.5 40.5  MCV 88.4 93.1  MCH 27.7 28.0  MCHC 31.4 30.1  RDW 14.3 14.5  PLT 192 174   BNP Recent Labs  Lab 01/04/19 1327  BNP 1,392.1*    DDimer No results for input(s): DDIMER in the last 168 hours.   Radiology/Studies:  DG Chest 2 View  Result Date: 01/04/2019 CLINICAL  DATA:  Shortness of breath, history of recent cholecystectomy EXAM: CHEST - 2 VIEW COMPARISON:  11/02/2018 FINDINGS: Cardiac shadow is accentuated by the portable technique. Aortic calcifications are noted. Postsurgical changes are seen. Patchy infiltrates are identified bilaterally which are new from the prior exam. Some scarring in the left mid lung as well as chronic left effusion is noted. Common bile duct stents are noted on the margin of the film. Postsurgical changes in the cervical spine are seen. IMPRESSION: Bilateral infiltrative opacities new from the prior exam consistent with multifocal pneumonia. Chronic changes in the left base. Electronically Signed   By: Inez Catalina M.D.   On: 01/04/2019 13:05    Assessment and Plan:   SOB Patient had a cholecystectomy 2 days ago. Patient found to be hypoxic/SOB by EMS placed on NRB and transitioned to [place] 3L O2. Patient found o have  leukocytosis with possible PNA. BNP and HS troponin elevated. CXR shows multifocal PNA.  - Likely multifactorial in the setting of possible PNA and heart failure - CTA chest ordered>> negative for PE  Multifocal Pneumonia - presenting with SOB, now on 4L O2.  - COVID negative - CXR showed new PNA. White count elevated - per IM  Elevated Troponin - Hs troponin elevated up to 2,000 - Patient not complaining of chest pain - Could be demand ischemia in the setting of PNA/Heart failure - continue to trend troponin  CAD s/p CABG - CABG in 2001 and Myoview in 2019 showing no ischemia - Patient does not complain of chest pain despite elevated troponin. EKG with no ischemic changes - Would hold on any ischemic work-up at this time until acute illness has been treated and breathing has improved.  - Continue ASA and Imdur - Not on BB due to ?chronic SOB/COPD - Not on ACE/ARB 2/2 to renal function  Acute on chronic diastolic Heart Failure -BNP elevated to 1,300 on admission - Last echo 2019 with preserved EF and  G1DD - Possible that patient is fluid up from recent surgery although patient is euvolemic on exam. Diffuse crackles on exam  - Will recheck echo - Would try one does of lasix and monitor response. - creatinine 1.51 (which appears to be around baseline)  HTN - continue home meds amlodipine and Imdur at baseline  HLD - Rosuvastatin  Renal Insufficiency/CKD III - Appears to have elevated creatinine for the last couple years - 1.5 on admission (around baseline)  For questions or updates, please contact [facility] Please consult [email or web address] for contact info under     Signed, Cadence Ninfa Meeker, PA-C  01/04/2019 3:49 PM   Personally seen and examined. Agree with above.  78 year old with shortness of breath multifocal pneumonia elevated troponin 2000 with known coronary disease CABG. currently not having any chest discomfort.  Moderately short of breath however.  GEN: Well nourished, well developed, in no acute distress  HEENT: normal  Neck: no JVD, carotid bruits, or masses Cardiac: RRR; no murmurs, rubs, or gallops,no edema  Respiratory:  clear to auscultation bilaterally, normal work of breathing, scattered rhonchi heard throughout GI: soft, nontender, nondistended, + BS MS: no deformity or atrophy  Skin: warm and dry, no rash Neuro:  Alert and Oriented x 3, Strength and sensation are intact Psych: euthymic mood, full affect  Assessment and plan:  Type II MI in the setting of multifocal pneumonia with known CAD CABG and acute on chronic diastolic heart failure  -We will go ahead and continue with aggressive medical management.  Repeat echocardiogram.  Diurese with IV Lasix.   -Since he just had his laparoscopic cholecystectomy, we will hold off on high-dose anticoagulation at this time unless anginal symptoms begin develop or more worrisome constellation of symptoms arise. -Continue with rosuvastatin, amlodipine, Imdur Baseline creatinine 1.5  Candee Furbish, MD

## 2019-01-04 NOTE — ED Notes (Signed)
Spoke to Dr. Einar Gip regarding troponin.  Per Dr. Einar Gip, [facility] cardiology team is following pt and is aware of his troponins increasing.  Pt is asymptomatic at this time.  Will try to page [facility] again

## 2019-01-04 NOTE — ED Notes (Signed)
Pt decreased to 4L O2 via [place], O2 sat 96%

## 2019-01-04 NOTE — Progress Notes (Signed)
Troponin continues to rise, now over 4,000.  Advised RN to contact Cardiology, who is currently following this patient with elevated troponins.

## 2019-01-05 ENCOUNTER — Inpatient Hospital Stay (HOSPITAL_COMMUNITY): Payer: Medicare Other

## 2019-01-05 ENCOUNTER — Encounter (HOSPITAL_COMMUNITY)
Admission: EM | Disposition: A | Discharge status: Home / Self Care | Payer: Self-pay | Source: Home / Self Care | Attending: Internal Medicine

## 2019-01-05 ENCOUNTER — Other Ambulatory Visit: Payer: Self-pay

## 2019-01-05 DIAGNOSIS — I25810 Atherosclerosis of coronary artery bypass graft(s) without angina pectoris: Secondary | ICD-10-CM

## 2019-01-05 DIAGNOSIS — I214 Non-ST elevation (NSTEMI) myocardial infarction: Secondary | ICD-10-CM

## 2019-01-05 DIAGNOSIS — I340 Nonrheumatic mitral (valve) insufficiency: Secondary | ICD-10-CM

## 2019-01-05 HISTORY — PX: LEFT HEART CATH AND CORS/GRAFTS ANGIOGRAPHY: CATH118250

## 2019-01-05 HISTORY — PX: CORONARY STENT INTERVENTION: CATH118234

## 2019-01-05 LAB — CBC
HCT: 34.8 % — ABNORMAL LOW (ref 39.0–52.0)
Hemoglobin: 11.1 g/dL — ABNORMAL LOW (ref 13.0–17.0)
MCH: 27.9 pg (ref 26.0–34.0)
MCHC: 31.9 g/dL (ref 30.0–36.0)
MCV: 87.4 fL (ref 80.0–100.0)
Platelets: 201 10*3/uL (ref 150–400)
RBC: 3.98 MIL/uL — ABNORMAL LOW (ref 4.22–5.81)
RDW: 14.5 % (ref 11.5–15.5)
WBC: 15.5 10*3/uL — ABNORMAL HIGH (ref 4.0–10.5)
nRBC: 0 % (ref 0.0–0.2)

## 2019-01-05 LAB — BASIC METABOLIC PANEL
Anion gap: 14 (ref 5–15)
BUN: 27 mg/dL — ABNORMAL HIGH (ref 8–23)
CO2: 29 mmol/L (ref 22–32)
Calcium: 9 mg/dL (ref 8.9–10.3)
Chloride: 101 mmol/L (ref 98–111)
Creatinine, Ser: 1.64 mg/dL — ABNORMAL HIGH (ref 0.61–1.24)
GFR calc Af Amer: 46 mL/min — ABNORMAL LOW (ref >60)
GFR calc non Af Amer: 40 mL/min — ABNORMAL LOW (ref >60)
Glucose, Bld: 152 mg/dL — ABNORMAL HIGH (ref 70–99)
Potassium: 4.8 mmol/L (ref 3.5–5.1)
Sodium: 144 mmol/L (ref 135–145)

## 2019-01-05 LAB — POCT ACTIVATED CLOTTING TIME
Activated Clotting Time: 230 seconds
Activated Clotting Time: 235 seconds

## 2019-01-05 LAB — ECHOCARDIOGRAM COMPLETE
Height: 69 in
Weight: 2817.6 oz

## 2019-01-05 SURGERY — LEFT HEART CATH AND CORS/GRAFTS ANGIOGRAPHY
Anesthesia: LOCAL

## 2019-01-05 MED ORDER — SODIUM CHLORIDE 0.9 % IV SOLN: INTRAVENOUS | Status: DC

## 2019-01-05 MED ORDER — FAMOTIDINE IN NACL 20-0.9 MG/50ML-% IV SOLN
INTRAVENOUS | Status: AC
  Filled 2019-01-05: qty 50

## 2019-01-05 MED ORDER — SODIUM CHLORIDE 0.9% FLUSH: 3.0000 mL | INTRAVENOUS | Status: DC | PRN

## 2019-01-05 MED ORDER — TIROFIBAN HCL IN NACL 5-0.9 MG/100ML-% IV SOLN
INTRAVENOUS | Status: DC | PRN
  Administered 2019-01-05: 0.075 ug/kg/min via INTRAVENOUS

## 2019-01-05 MED ORDER — TIROFIBAN HCL IN NACL 5-0.9 MG/100ML-% IV SOLN
INTRAVENOUS | Status: AC
  Filled 2019-01-05: qty 100

## 2019-01-05 MED ORDER — HYDRALAZINE HCL 20 MG/ML IJ SOLN: 10.0000 mg | INTRAMUSCULAR | Status: AC | PRN

## 2019-01-05 MED ORDER — LIDOCAINE HCL (PF) 1 % IJ SOLN
INTRAMUSCULAR | Status: AC
  Filled 2019-01-05: qty 30

## 2019-01-05 MED ORDER — CLOPIDOGREL BISULFATE 300 MG PO TABS
ORAL_TABLET | ORAL | Status: DC | PRN
  Administered 2019-01-05: 600 mg via ORAL

## 2019-01-05 MED ORDER — LABETALOL HCL 5 MG/ML IV SOLN: 10.0000 mg | INTRAVENOUS | Status: AC | PRN

## 2019-01-05 MED ORDER — HEPARIN SODIUM (PORCINE) 1000 UNIT/ML IJ SOLN
INTRAMUSCULAR | Status: AC
  Filled 2019-01-05: qty 1

## 2019-01-05 MED ORDER — VERAPAMIL HCL 2.5 MG/ML IV SOLN
INTRAVENOUS | Status: DC | PRN
  Administered 2019-01-05: 10 mL via INTRA_ARTERIAL

## 2019-01-05 MED ORDER — SODIUM CHLORIDE 0.9% FLUSH
3.0000 mL | Freq: Two times a day (BID) | INTRAVENOUS | Status: DC
  Administered 2019-01-05: 3 mL via INTRAVENOUS

## 2019-01-05 MED ORDER — HEPARIN (PORCINE) 25000 UT/250ML-% IV SOLN
1000.0000 [IU]/h | INTRAVENOUS | Status: DC
  Administered 2019-01-05: 1000 [IU]/h via INTRAVENOUS
  Filled 2019-01-05: qty 250

## 2019-01-05 MED ORDER — MIDAZOLAM HCL 2 MG/2ML IJ SOLN
INTRAMUSCULAR | Status: DC | PRN
  Administered 2019-01-05 (×2): 0.5 mg via INTRAVENOUS

## 2019-01-05 MED ORDER — MIDAZOLAM HCL 2 MG/2ML IJ SOLN
INTRAMUSCULAR | Status: AC
  Filled 2019-01-05: qty 2

## 2019-01-05 MED ORDER — ENSURE ENLIVE PO LIQD: 237.0000 mL | Freq: Two times a day (BID) | ORAL | Status: DC

## 2019-01-05 MED ORDER — TIROFIBAN (AGGRASTAT) BOLUS VIA INFUSION
INTRAVENOUS | Status: DC | PRN
  Administered 2019-01-05: 1997.5 ug via INTRAVENOUS

## 2019-01-05 MED ORDER — ALUM & MAG HYDROXIDE-SIMETH 200-200-20 MG/5ML PO SUSP
30.0000 mL | ORAL | Status: DC | PRN
  Administered 2019-01-05: 30 mL via ORAL
  Filled 2019-01-05: qty 30

## 2019-01-05 MED ORDER — ASPIRIN 81 MG PO CHEW: 81.0000 mg | CHEWABLE_TABLET | ORAL | Status: DC

## 2019-01-05 MED ORDER — IOHEXOL 350 MG/ML SOLN
INTRAVENOUS | Status: DC | PRN
  Administered 2019-01-05: 135 mL

## 2019-01-05 MED ORDER — SODIUM CHLORIDE 0.9% FLUSH
3.0000 mL | Freq: Two times a day (BID) | INTRAVENOUS | Status: DC
  Administered 2019-01-05 – 2019-01-08 (×6): 3 mL via INTRAVENOUS

## 2019-01-05 MED ORDER — ASPIRIN 81 MG PO CHEW
81.0000 mg | CHEWABLE_TABLET | ORAL | Status: AC
  Administered 2019-01-05: 81 mg via ORAL
  Filled 2019-01-05: qty 1

## 2019-01-05 MED ORDER — HEPARIN (PORCINE) IN NACL 1000-0.9 UT/500ML-% IV SOLN
INTRAVENOUS | Status: AC
  Filled 2019-01-05: qty 1000

## 2019-01-05 MED ORDER — VERAPAMIL HCL 2.5 MG/ML IV SOLN
INTRAVENOUS | Status: DC | PRN
  Administered 2019-01-05 (×2): 500 ug via INTRACORONARY

## 2019-01-05 MED ORDER — VERAPAMIL HCL 2.5 MG/ML IV SOLN
INTRAVENOUS | Status: AC
  Filled 2019-01-05: qty 2

## 2019-01-05 MED ORDER — SODIUM CHLORIDE 0.9 % IV SOLN: 250.0000 mL | INTRAVENOUS | Status: DC | PRN

## 2019-01-05 MED ORDER — FENTANYL CITRATE (PF) 100 MCG/2ML IJ SOLN
INTRAMUSCULAR | Status: DC | PRN
  Administered 2019-01-05: 12.5 ug via INTRAVENOUS
  Administered 2019-01-05 (×2): 25 ug via INTRAVENOUS
  Administered 2019-01-05: 12.5 ug via INTRAVENOUS

## 2019-01-05 MED ORDER — NITROGLYCERIN 1 MG/10 ML FOR IR/CATH LAB
INTRA_ARTERIAL | Status: DC | PRN
  Administered 2019-01-05: 200 ug via INTRACORONARY

## 2019-01-05 MED ORDER — FENTANYL CITRATE (PF) 100 MCG/2ML IJ SOLN
INTRAMUSCULAR | Status: AC
  Filled 2019-01-05: qty 2

## 2019-01-05 MED ORDER — NITROGLYCERIN 1 MG/10 ML FOR IR/CATH LAB
INTRA_ARTERIAL | Status: AC
  Filled 2019-01-05: qty 10

## 2019-01-05 MED ORDER — CLOPIDOGREL BISULFATE 75 MG PO TABS
75.0000 mg | ORAL_TABLET | Freq: Every day | ORAL | Status: DC
  Administered 2019-01-06 – 2019-01-08 (×3): 75 mg via ORAL
  Filled 2019-01-05 (×3): qty 1

## 2019-01-05 MED ORDER — LIDOCAINE HCL (PF) 1 % IJ SOLN
INTRAMUSCULAR | Status: DC | PRN
  Administered 2019-01-05: 2 mL via INTRADERMAL

## 2019-01-05 MED ORDER — HEPARIN SODIUM (PORCINE) 5000 UNIT/ML IJ SOLN
5000.0000 [IU] | Freq: Three times a day (TID) | INTRAMUSCULAR | Status: DC
  Administered 2019-01-06 – 2019-01-08 (×7): 5000 [IU] via SUBCUTANEOUS
  Filled 2019-01-05 (×7): qty 1

## 2019-01-05 MED ORDER — HEPARIN SODIUM (PORCINE) 1000 UNIT/ML IJ SOLN
INTRAMUSCULAR | Status: DC | PRN
  Administered 2019-01-05: 5000 [IU] via INTRAVENOUS
  Administered 2019-01-05: 4000 [IU] via INTRAVENOUS

## 2019-01-05 MED ORDER — HEPARIN (PORCINE) IN NACL 1000-0.9 UT/500ML-% IV SOLN
INTRAVENOUS | Status: AC
  Filled 2019-01-05: qty 500

## 2019-01-05 MED ORDER — CLOPIDOGREL BISULFATE 300 MG PO TABS
ORAL_TABLET | ORAL | Status: AC
  Filled 2019-01-05: qty 2

## 2019-01-05 MED ORDER — HEPARIN (PORCINE) IN NACL 1000-0.9 UT/500ML-% IV SOLN
INTRAVENOUS | Status: DC | PRN
  Administered 2019-01-05 (×3): 500 mL

## 2019-01-05 MED ORDER — FAMOTIDINE IN NACL 20-0.9 MG/50ML-% IV SOLN
INTRAVENOUS | Status: AC | PRN
  Administered 2019-01-05: 20 mg via INTRAVENOUS

## 2019-01-05 SURGICAL SUPPLY — 20 items
BALLN SAPPHIRE 2.5X12 (BALLOONS) ×2
BALLN SAPPHIRE ~~LOC~~ 3.25X18 (BALLOONS) ×1 IMPLANT
BALLOON SAPPHIRE 2.5X12 (BALLOONS) IMPLANT
CATH DXT MULTI JL4 JR4 ANG PIG (CATHETERS) ×2 IMPLANT
CATH EXTRAC PRONTO 5.5F 138CM (CATHETERS) ×1 IMPLANT
CATH INFINITI 5 FR IM (CATHETERS) ×1 IMPLANT
CATH VISTA GUIDE 6FR MPA1 (CATHETERS) ×1 IMPLANT
DEVICE RAD COMP TR BAND LRG (VASCULAR PRODUCTS) ×1 IMPLANT
DEVICE SPIDERFX EMB PROT 5MM (WIRE) ×1 IMPLANT
GLIDESHEATH SLEND SS 6F .021 (SHEATH) ×2 IMPLANT
GUIDEWIRE INQWIRE 1.5J.035X260 (WIRE) ×1 IMPLANT
INQWIRE 1.5J .035X260CM (WIRE) ×2
KIT ENCORE 26 ADVANTAGE (KITS) ×1 IMPLANT
KIT HEART LEFT (KITS) ×2 IMPLANT
PACK CARDIAC CATHETERIZATION (CUSTOM PROCEDURE TRAY) ×2 IMPLANT
STENT RESOLUTE ONYX 3.0X18 (Permanent Stent) ×1 IMPLANT
STENT RESOLUTE ONYX3.0X38 (Permanent Stent) ×2 IMPLANT
TRANSDUCER W/STOPCOCK (MISCELLANEOUS) ×2 IMPLANT
TUBING CIL FLEX 10 FLL-RA (TUBING) ×2 IMPLANT
WIRE RUNTHROUGH .014X180CM (WIRE) ×1 IMPLANT

## 2019-01-05 NOTE — H&P (View-Only) (Signed)
Progress Note  Patient Name: [name] Date of Encounter: 01/05/2019  Primary Cardiologist: [name], MD   Subjective   Breathing is better this morning.  He is not having any chest discomfort however his troponin has increased to 4000.  COVID-19 negative.  Inpatient Medications    Scheduled Meds: . amLODipine  2.5 mg Oral Daily  . aspirin EC  81 mg Oral QHS  . ezetimibe  10 mg Oral Daily  . feeding supplement (ENSURE ENLIVE)  237 mL Oral BID BM  . isosorbide mononitrate  60 mg Oral Daily  . nebivolol  10 mg Oral Daily  . pantoprazole  80 mg Oral Daily  . rOPINIRole  2 mg Oral QHS  . rosuvastatin  20 mg Oral Daily  . sodium chloride flush  3 mL Intravenous Once  . sodium chloride flush  3 mL Intravenous Q12H  . venlafaxine  75 mg Oral Daily   Continuous Infusions: . sodium chloride    . azithromycin    . cefTRIAXone (ROCEPHIN)  IV    . heparin     PRN Meds: sodium chloride, acetaminophen **OR** acetaminophen, HYDROmorphone (DILAUDID) injection, ondansetron **OR** ondansetron (ZOFRAN) IV, sodium chloride flush, traMADol   Vital Signs    Vitals:   01/04/19 1900 01/05/19 0000 01/05/19 0004 01/05/19 0430  BP: 133/82  125/65 120/72  Pulse:   83 87  Resp: (!) 36  (!) 21 (!) 23  Temp:   98.1 F (36.7 C) 98.3 F (36.8 C)  TempSrc:   Oral Oral  SpO2:   96% 98%  Weight:  79.9 kg    Height:   5\' 9"  (1.753 m)     Intake/Output Summary (Last 24 hours) at 01/05/2019 0836 Last data filed at 01/05/2019 0600 Gross per 24 hour  Intake 590 ml  Output 550 ml  Net 40 ml   Last 3 Weights 01/05/2019 01/02/2019 12/29/2018  Weight (lbs) 176 lb 1.6 oz 180 lb 178 lb  Weight (kg) 79.878 kg 81.647 kg 80.74 kg      Telemetry    Sinus rhythm no other adverse arrhythmias- Personally Reviewed  ECG    Sinus rhythm with ST depression noted in the precordial leads- Personally Reviewed  Physical Exam   GEN: No acute distress.   Neck: No JVD Cardiac: RRR, no  murmurs, rubs, or gallops.  Respiratory: Clear to auscultation bilaterally. GI: Soft, nontender, non-distended  MS: No edema; No deformity. Neuro:  Nonfocal  Psych: Normal affect   Labs    High Sensitivity Troponin:   Recent Labs  Lab 01/04/19 1153 01/04/19 1327 01/04/19 1720 01/04/19 2115  TROPONINIHS 562* 2,259* 3,189* 4,847*      Chemistry Recent Labs  Lab 12/29/18 1200 01/04/19 1327 01/05/19 0354  NA 141 141 144  K 4.1 3.9 4.8  CL 104 103 101  CO2 29 27 29   GLUCOSE 162* 123* 152*  BUN 19 25* 27*  CREATININE 1.40* 1.51* 1.64*  CALCIUM 8.8* 9.1 9.0  PROT 7.5 7.6  --   ALBUMIN 3.8 3.1*  --   AST 21 42*  --   ALT 14 25  --   ALKPHOS 64 66  --   BILITOT 0.8 0.8  --   GFRNONAA 48* 44* 40*  GFRAA 56* 51* 46*  ANIONGAP 8 11 14      Hematology Recent Labs  Lab 12/29/18 1200 01/04/19 1153 01/05/19 0354  WBC 10.4 22.8* 15.5*  RBC 4.47 4.35 3.98*  HGB 12.4* 12.2* 11.1*  HCT 39.5 40.5 34.8*  MCV 88.4 93.1 87.4  MCH 27.7 28.0 27.9  MCHC 31.4 30.1 31.9  RDW 14.3 14.5 14.5  PLT 192 174 201    BNP Recent Labs  Lab 01/04/19 1327  BNP 1,392.1*     DDimer No results for input(s): DDIMER in the last 168 hours.   Radiology    DG Chest 2 View  Result Date: 01/04/2019 CLINICAL DATA:  Shortness of breath, history of recent cholecystectomy EXAM: CHEST - 2 VIEW COMPARISON:  11/02/2018 FINDINGS: Cardiac shadow is accentuated by the portable technique. Aortic calcifications are noted. Postsurgical changes are seen. Patchy infiltrates are identified bilaterally which are new from the prior exam. Some scarring in the left mid lung as well as chronic left effusion is noted. Common bile duct stents are noted on the margin of the film. Postsurgical changes in the cervical spine are seen. IMPRESSION: Bilateral infiltrative opacities new from the prior exam consistent with multifocal pneumonia. Chronic changes in the left base. Electronically Signed   By: Inez Catalina M.D.    On: 01/04/2019 13:05   CT Angio Chest PE W/Cm &/Or Wo Cm  Result Date: 01/04/2019 CLINICAL DATA:  Shortness of breath. Recent surgery. Evaluate for pulmonary embolus. EXAM: CT ANGIOGRAPHY CHEST WITH CONTRAST TECHNIQUE: Multidetector CT imaging of the chest was performed using the standard protocol during bolus administration of intravenous contrast. Multiplanar CT image reconstructions and MIPs were obtained to evaluate the vascular anatomy. CONTRAST:  174mL OMNIPAQUE IOHEXOL 350 MG/ML SOLN COMPARISON:  CT chest 04/04/2010 FINDINGS: Cardiovascular: Previous median sternotomy and CABG procedure. Heart size normal. No pericardial effusion. Aortic atherosclerosis. The main pulmonary artery appears patent. There is no central obstructing pulmonary emboli identified bilaterally. No lobar or segmental pulmonary artery filling defects identified. Mediastinum/Nodes: The trachea appears patent and is midline. Normal appearance of the thyroid gland. Normal appearance of the esophagus. No enlarged mediastinal or hilar adenopathy. Lungs/Pleura: Mild diffuse pleural thickening overlying the left lung is identified. Multifocal bilateral airspace and ground-glass densities are noted throughout both lungs. These findings correspond with the chest radiograph findings from earlier today and are favored to represent multifocal pneumonia. Upper Abdomen: Postoperative changes within the upper abdomen are identified. Biliary drain is noted. Pneumoperitoneum is identified within the upper abdomen, likely postoperative. Musculoskeletal: No chest wall abnormality. No acute or significant osseous findings. Review of the MIP images confirms the above findings. IMPRESSION: 1. No evidence for acute pulmonary embolus. 2. Extensive bilateral ground-glass and airspace opacities are noted throughout both lungs concerning for multi focal pneumonia. 3. Mild diffuse pleural thickening overlies the left lung with decrease in volume of left pleural  effusion. Aortic Atherosclerosis (ICD10-I70.0). Electronically Signed   By: Kerby Moors M.D.   On: 01/04/2019 16:59    Cardiac Studies   Echo currently being performed.  Myoview 2019-EF 51% with no ischemia or infarct  Echo 2019-EF 60 to 65% otherwise unremarkable  Patient Profile     78 y.o. male with CAD post CABG 2001 LIMA to LAD vein graft to marginal branch vein graft to right coronary artery with recent cholecystectomy laparoscopic on Monday here with bilateral pneumonia shortness of breath and non-ST elevation myocardial infarction possibly type II MI with rising troponin.  Also has underlying COPD obstructive sleep apnea  Assessment & Plan    Non-ST elevation myocardial infarction-potentially type II MI CAD post CABG Bilateral pneumonia Chronic kidney disease 3 COPD Cholecystectomy Monday laparoscopic Acute on chronic diastolic heart failure  -Negative for PE on  CT -Bilateral pneumonia noted, white count improving on current antibiotics -IV Lasix has been administered  -IV heparin has been started secondary to marked elevation in troponin currently 4000 -We will go ahead and proceed with cardiac catheterization, risks and benefits of been explained including stroke heart attack death renal impairment bleeding.  He is willing to proceed. -He did have cholecystectomy on Monday laparoscopically.  At this time, should be okay for anticoagulation, watch for any signs of bleeding.  Weighing risks versus benefits.      For questions or updates, please contact [place] Please consult [email or web address] for contact info under        Signed, Candee Furbish, MD  01/05/2019, 8:36 AM

## 2019-01-05 NOTE — Interval H&P Note (Signed)
History and Physical Interval Note:  01/05/2019 2:56 PM  [name]  has presented today for surgery, with the diagnosis of NSTEMI.  The various methods of treatment have been discussed with the patient and family. After consideration of risks, benefits and other options for treatment, the patient has consented to  Procedure(s): LEFT HEART CATH AND CORS/GRAFTS ANGIOGRAPHY (N/A) as a surgical intervention.  The patient's history has been reviewed, patient examined, no change in status, stable for surgery.  I have reviewed the patient's chart and labs.  Questions were answered to the patient's satisfaction.    Cath Lab Visit (complete for each Cath Lab visit)  Clinical Evaluation Leading to the Procedure:   ACS: Yes.    Non-ACS:  N/A  [place] Standard

## 2019-01-05 NOTE — Plan of Care (Signed)
  Problem: Pain Managment: Goal: General experience of comfort will improve Outcome: Progressing Note: PO pain meds effective.   Problem: Cardiovascular: Goal: Vascular access site(s) Level 0-1 will be maintained Outcome: Progressing Note: Left radial site is level 0.  No s/s of bleeding or hematoma noted.

## 2019-01-05 NOTE — Progress Notes (Signed)
Progress Note  Patient Name: [name] Date of Encounter: 01/05/2019  Primary Cardiologist: [name], MD   Subjective   Breathing is better this morning.  He is not having any chest discomfort however his troponin has increased to 4000.  COVID-19 negative.  Inpatient Medications    Scheduled Meds: . amLODipine  2.5 mg Oral Daily  . aspirin EC  81 mg Oral QHS  . ezetimibe  10 mg Oral Daily  . feeding supplement (ENSURE ENLIVE)  237 mL Oral BID BM  . isosorbide mononitrate  60 mg Oral Daily  . nebivolol  10 mg Oral Daily  . pantoprazole  80 mg Oral Daily  . rOPINIRole  2 mg Oral QHS  . rosuvastatin  20 mg Oral Daily  . sodium chloride flush  3 mL Intravenous Once  . sodium chloride flush  3 mL Intravenous Q12H  . venlafaxine  75 mg Oral Daily   Continuous Infusions: . sodium chloride    . azithromycin    . cefTRIAXone (ROCEPHIN)  IV    . heparin     PRN Meds: sodium chloride, acetaminophen **OR** acetaminophen, HYDROmorphone (DILAUDID) injection, ondansetron **OR** ondansetron (ZOFRAN) IV, sodium chloride flush, traMADol   Vital Signs    Vitals:   01/04/19 1900 01/05/19 0000 01/05/19 0004 01/05/19 0430  BP: 133/82  125/65 120/72  Pulse:   83 87  Resp: (!) 36  (!) 21 (!) 23  Temp:   98.1 F (36.7 C) 98.3 F (36.8 C)  TempSrc:   Oral Oral  SpO2:   96% 98%  Weight:  79.9 kg    Height:   5\' 9"  (1.753 m)     Intake/Output Summary (Last 24 hours) at 01/05/2019 0836 Last data filed at 01/05/2019 0600 Gross per 24 hour  Intake 590 ml  Output 550 ml  Net 40 ml   Last 3 Weights 01/05/2019 01/02/2019 12/29/2018  Weight (lbs) 176 lb 1.6 oz 180 lb 178 lb  Weight (kg) 79.878 kg 81.647 kg 80.74 kg      Telemetry    Sinus rhythm no other adverse arrhythmias- Personally Reviewed  ECG    Sinus rhythm with ST depression noted in the precordial leads- Personally Reviewed  Physical Exam   GEN: No acute distress.   Neck: No JVD Cardiac: RRR, no  murmurs, rubs, or gallops.  Respiratory: Clear to auscultation bilaterally. GI: Soft, nontender, non-distended  MS: No edema; No deformity. Neuro:  Nonfocal  Psych: Normal affect   Labs    High Sensitivity Troponin:   Recent Labs  Lab 01/04/19 1153 01/04/19 1327 01/04/19 1720 01/04/19 2115  TROPONINIHS 562* 2,259* 3,189* 4,847*      Chemistry Recent Labs  Lab 12/29/18 1200 01/04/19 1327 01/05/19 0354  NA 141 141 144  K 4.1 3.9 4.8  CL 104 103 101  CO2 29 27 29   GLUCOSE 162* 123* 152*  BUN 19 25* 27*  CREATININE 1.40* 1.51* 1.64*  CALCIUM 8.8* 9.1 9.0  PROT 7.5 7.6  --   ALBUMIN 3.8 3.1*  --   AST 21 42*  --   ALT 14 25  --   ALKPHOS 64 66  --   BILITOT 0.8 0.8  --   GFRNONAA 48* 44* 40*  GFRAA 56* 51* 46*  ANIONGAP 8 11 14      Hematology Recent Labs  Lab 12/29/18 1200 01/04/19 1153 01/05/19 0354  WBC 10.4 22.8* 15.5*  RBC 4.47 4.35 3.98*  HGB 12.4* 12.2* 11.1*  HCT 39.5 40.5 34.8*  MCV 88.4 93.1 87.4  MCH 27.7 28.0 27.9  MCHC 31.4 30.1 31.9  RDW 14.3 14.5 14.5  PLT 192 174 201    BNP Recent Labs  Lab 01/04/19 1327  BNP 1,392.1*     DDimer No results for input(s): DDIMER in the last 168 hours.   Radiology    DG Chest 2 View  Result Date: 01/04/2019 CLINICAL DATA:  Shortness of breath, history of recent cholecystectomy EXAM: CHEST - 2 VIEW COMPARISON:  11/02/2018 FINDINGS: Cardiac shadow is accentuated by the portable technique. Aortic calcifications are noted. Postsurgical changes are seen. Patchy infiltrates are identified bilaterally which are new from the prior exam. Some scarring in the left mid lung as well as chronic left effusion is noted. Common bile duct stents are noted on the margin of the film. Postsurgical changes in the cervical spine are seen. IMPRESSION: Bilateral infiltrative opacities new from the prior exam consistent with multifocal pneumonia. Chronic changes in the left base. Electronically Signed   By: Inez Catalina M.D.    On: 01/04/2019 13:05   CT Angio Chest PE W/Cm &/Or Wo Cm  Result Date: 01/04/2019 CLINICAL DATA:  Shortness of breath. Recent surgery. Evaluate for pulmonary embolus. EXAM: CT ANGIOGRAPHY CHEST WITH CONTRAST TECHNIQUE: Multidetector CT imaging of the chest was performed using the standard protocol during bolus administration of intravenous contrast. Multiplanar CT image reconstructions and MIPs were obtained to evaluate the vascular anatomy. CONTRAST:  144mL OMNIPAQUE IOHEXOL 350 MG/ML SOLN COMPARISON:  CT chest 04/04/2010 FINDINGS: Cardiovascular: Previous median sternotomy and CABG procedure. Heart size normal. No pericardial effusion. Aortic atherosclerosis. The main pulmonary artery appears patent. There is no central obstructing pulmonary emboli identified bilaterally. No lobar or segmental pulmonary artery filling defects identified. Mediastinum/Nodes: The trachea appears patent and is midline. Normal appearance of the thyroid gland. Normal appearance of the esophagus. No enlarged mediastinal or hilar adenopathy. Lungs/Pleura: Mild diffuse pleural thickening overlying the left lung is identified. Multifocal bilateral airspace and ground-glass densities are noted throughout both lungs. These findings correspond with the chest radiograph findings from earlier today and are favored to represent multifocal pneumonia. Upper Abdomen: Postoperative changes within the upper abdomen are identified. Biliary drain is noted. Pneumoperitoneum is identified within the upper abdomen, likely postoperative. Musculoskeletal: No chest wall abnormality. No acute or significant osseous findings. Review of the MIP images confirms the above findings. IMPRESSION: 1. No evidence for acute pulmonary embolus. 2. Extensive bilateral ground-glass and airspace opacities are noted throughout both lungs concerning for multi focal pneumonia. 3. Mild diffuse pleural thickening overlies the left lung with decrease in volume of left pleural  effusion. Aortic Atherosclerosis (ICD10-I70.0). Electronically Signed   By: Kerby Moors M.D.   On: 01/04/2019 16:59    Cardiac Studies   Echo currently being performed.  Myoview 2019-EF 51% with no ischemia or infarct  Echo 2019-EF 60 to 65% otherwise unremarkable  Patient Profile     78 y.o. male with CAD post CABG 2001 LIMA to LAD vein graft to marginal branch vein graft to right coronary artery with recent cholecystectomy laparoscopic on Monday here with bilateral pneumonia shortness of breath and non-ST elevation myocardial infarction possibly type II MI with rising troponin.  Also has underlying COPD obstructive sleep apnea  Assessment & Plan    Non-ST elevation myocardial infarction-potentially type II MI CAD post CABG Bilateral pneumonia Chronic kidney disease 3 COPD Cholecystectomy Monday laparoscopic Acute on chronic diastolic heart failure  -Negative for PE on  CT -Bilateral pneumonia noted, white count improving on current antibiotics -IV Lasix has been administered  -IV heparin has been started secondary to marked elevation in troponin currently 4000 -We will go ahead and proceed with cardiac catheterization, risks and benefits of been explained including stroke heart attack death renal impairment bleeding.  He is willing to proceed. -He did have cholecystectomy on Monday laparoscopically.  At this time, should be okay for anticoagulation, watch for any signs of bleeding.  Weighing risks versus benefits.      For questions or updates, please contact [place] Please consult [email or web address] for contact info under        Signed, Candee Furbish, MD  01/05/2019, 8:36 AM

## 2019-01-05 NOTE — Progress Notes (Signed)
  Echocardiogram 2D Echocardiogram has been performed.  [name] 01/05/2019, 9:28 AM

## 2019-01-05 NOTE — Progress Notes (Signed)
He is feeling better today. O2 requirement decreasing. Denies abdominal pain or nausea, did have some diarrhea yesterday.  Abdomen is soft and nontender, nondistended, incisions all look good no cellulitis or hematoma  POD 3 robotic cholecystectomy (previously admitted with cholangitis, has three CBD stents in place currently, see operative H&P from 12/14 and prior notes from Dr. Rush Landmark as well as inpatient surgical consultation from 04/04/18)- appears to be recovering well from the GI standpoint and notes that the abdominal pain he was having before surgery has resolved. Significant cardiac history- unfortunately now with nstemi/ multifocal pneumonia. Appreciate care from the medical and cardiology services. Ok to anticoagulate if needed, would follow hgb closely. Please contact us if we can be of help.    [facility] PA

## 2019-01-05 NOTE — Progress Notes (Signed)
Initial Nutrition Assessment  INTERVENTION:   -Ensure Enlive po BID, each supplement provides 350 kcal and 20 grams of protein  NUTRITION DIAGNOSIS:   Increased nutrient needs related to acute illness as evidenced by estimated needs.  GOAL:   Patient will meet greater than or equal to 90% of their needs  MONITOR:   PO intake, Supplement acceptance, Diet advancement, Labs, Weight trends, I & O's  REASON FOR ASSESSMENT:   Malnutrition Screening Tool    ASSESSMENT:   78 y.o. male with medical history significant of coronary artery disease, COPD, hypertension, hyperlipidemia with recent cholecystectomy who presents with sudden onset of shortness of breath.  12/14: s/p lap cholecystectomy  **RD working remotely**  Patient having heart cath today, currently in cath lab. Pt NPO.  Ensure supplements have been ordered, not able to drink yet d/t NPO.  Per weight records, pt has lost 4 lbs since 10/14, insignificant for time frame.  Labs reviewed. Medications: IV Zofran PRN  NUTRITION - FOCUSED PHYSICAL EXAM:  Working remotely.  Diet Order:   Diet Order            Diet NPO time specified Except for: Sips with Meds  Diet effective now              EDUCATION NEEDS:   No education needs have been identified at this time  Skin:  Skin Assessment: Reviewed RN Assessment  Last BM:  12/16  Height:   Ht Readings from Last 1 Encounters:  01/05/19 5\' 9"  (1.753 m)    Weight:   Wt Readings from Last 1 Encounters:  01/05/19 79.9 kg    Ideal Body Weight:  72.7 kg  BMI:  Body mass index is 26.01 kg/m.  Estimated Nutritional Needs:   Kcal:  1900-2100  Protein:  80-90g  Fluid:  1.9L/day  Clayton Bibles, MS, RD, LDN Inpatient Clinical Dietitian Pager: [phone] After Hours Pager: [phone]

## 2019-01-05 NOTE — Progress Notes (Signed)
PROGRESS NOTE    [name]  [id number] DOB: 1940/07/12 DOA: 01/04/2019 PCP: Isaac Bliss, Rayford Halsted, MD     Brief Narrative:  [name] is a 78 y.o. male with medical history significant of coronary artery disease, COPD, hypertension, hyperlipidemia with recent cholecystectomy who presents with sudden onset of shortness of breath.  He had an uncomplicated surgical procedure 2 days ago, woke up this morning feeling short of breath and could not walk a short distance in his house.  He admits to feeling hot and cold but did not take a temperature at home, admits to a productive cough, shortness of breath but no chest pain, no nausea and vomiting, improved abdominal pain postsurgically, and continued diarrhea. Labs obtained which revealed creatinine stable at 1.5, BNP 1392, troponin  162 which subsequently elevated to 2259, WBC 22.8.  Covid antigen testing was negative, PCR pending.  CTA chest was negative for PE but did reveal multifocal pneumonia.  Cardiology also consulted for elevated troponin.  New events last 24 hours / Subjective: Troponin elevated 4,847 overnight.  He has no complaints of chest pain this morning.  He states that his breathing has actually improved today.  Assessment & Plan:   Principal Problem:   Acute hypoxemic respiratory failure (HCC) Active Problems:   Depression   Essential hypertension   Restless leg syndrome   Hx of CABG   Hyperlipidemia LDL goal <70   GERD (gastroesophageal reflux disease)   CKD (chronic kidney disease), stage III   CAD (coronary artery disease)   Multifocal pneumonia   Severe sepsis (HCC)   Elevated troponin   Acute on chronic diastolic (congestive) heart failure (HCC)   Acute respiratory failure (HCC)   Acute hypoxemic respiratory failure -CTA chest negative for PE, but did show extensive bilateral ground-glass and airspace opacities are noted throughout both lungs concerning for multi focal  pneumonia. -Required NRB at presentation to ED, now weaned to 4L [place] O2  Severe sepsis secondary to multifocal pneumonia, sepsis POA, with elevated troponin and respiratory failure  -Covid negative  -Rocephin/Azithromycin  -WBC improving  NSTEMI CAD s/p CABG  -Appreciate cardiology consult  -Continue aspirin, Imdur -IV heparin (briefly discussed with general surgery PA on call, there should be no issues with IV heparin post-op)  -Heart cath planned for today  Acute on chronic diastolic HF -Appreciate cardiology consult  -Repeat echo pending -Lasix given   HTN -Amlodipine, Bystolic  HLD -Crestor, Zetia  CKD stage 3b  -Baseline Cr ~1.6-1.7 -Stable   S/p cholecystectomy  -Stable   Depression -Continue Effexor  GERD, hx Barrett's esophagus  -PPI   DVT prophylaxis: IV heparin Code Status: Full Family Communication: Spoke with daughter with update Disposition Plan: Heart cath planned   Consultants:   Cardiology  Procedures:   Heart cath 12/17   Antimicrobials:  Anti-infectives (From admission, onward)   Start     Dose/Rate Route Frequency Ordered Stop   01/05/19 1830  azithromycin (ZITHROMAX) 250 mg in dextrose 5 % 125 mL IVPB     250 mg 125 mL/hr over 60 Minutes Intravenous Every 24 hours 01/04/19 1819     01/05/19 1730  cefTRIAXone (ROCEPHIN) 1 g in sodium chloride 0.9 % 100 mL IVPB     1 g 200 mL/hr over 30 Minutes Intravenous Every 24 hours 01/04/19 1819     01/04/19 1830  azithromycin (ZITHROMAX) 500 mg in sodium chloride 0.9 % 250 mL IVPB     500 mg 250 mL/hr over 60  Minutes Intravenous  Once 01/04/19 1825 01/04/19 1936   01/04/19 1715  cefTRIAXone (ROCEPHIN) 1 g in sodium chloride 0.9 % 100 mL IVPB     1 g 200 mL/hr over 30 Minutes Intravenous  Once 01/04/19 1711 01/04/19 1825   01/04/19 1715  azithromycin (ZITHROMAX) 500 mg in sodium chloride 0.9 % 250 mL IVPB  Status:  Discontinued     500 mg 250 mL/hr over 60 Minutes Intravenous  Once  01/04/19 1711 01/04/19 1825        Objective: Vitals:   01/04/19 1900 01/05/19 0000 01/05/19 0004 01/05/19 0430  BP: 133/82  125/65 120/72  Pulse:   83 87  Resp: (!) 36  (!) 21 (!) 23  Temp:   98.1 F (36.7 C) 98.3 F (36.8 C)  TempSrc:   Oral Oral  SpO2:   96% 98%  Weight:  79.9 kg    Height:   5\' 9"  (1.753 m)     Intake/Output Summary (Last 24 hours) at 01/05/2019 0947 Last data filed at 01/05/2019 0600 Gross per 24 hour  Intake 590 ml  Output 550 ml  Net 40 ml   Filed Weights   01/05/19 0000  Weight: 79.9 kg    Examination:  General exam: Appears calm and comfortable  Respiratory system: Mild crackles. Respiratory effort normal. No respiratory distress. No conversational dyspnea.  Cardiovascular system: S1 & S2 heard, RRR. No murmurs. No pedal edema. Gastrointestinal system: Abdomen is nondistended, soft and nontender. Normal bowel sounds heard. Central nervous system: Alert and oriented. No focal neurological deficits. Speech clear.  Extremities: Symmetric in appearance  Skin: No rashes, lesions or ulcers on exposed skin  Psychiatry: Judgement and insight appear normal. Mood & affect appropriate.   Data Reviewed: I have personally reviewed following labs and imaging studies  CBC: Recent Labs  Lab 12/29/18 1200 01/04/19 1153 01/05/19 0354  WBC 10.4 22.8* 15.5*  NEUTROABS 7.5  --   --   HGB 12.4* 12.2* 11.1*  HCT 39.5 40.5 34.8*  MCV 88.4 93.1 87.4  PLT 192 174 [id number]   Basic Metabolic Panel: Recent Labs  Lab 12/29/18 1200 01/04/19 1327 01/05/19 0354  NA 141 141 144  K 4.1 3.9 4.8  CL 104 103 101  CO2 29 27 29   GLUCOSE 162* 123* 152*  BUN 19 25* 27*  CREATININE 1.40* 1.51* 1.64*  CALCIUM 8.8* 9.1 9.0   GFR: Estimated Creatinine Clearance: 38.3 mL/min (A) (by C-G formula based on SCr of 1.64 mg/dL (H)). Liver Function Tests: Recent Labs  Lab 12/29/18 1200 01/04/19 1327  AST 21 42*  ALT 14 25  ALKPHOS 64 66  BILITOT 0.8 0.8  PROT 7.5  7.6  ALBUMIN 3.8 3.1*   No results for input(s): LIPASE, AMYLASE in the last 168 hours. No results for input(s): AMMONIA in the last 168 hours. Coagulation Profile: No results for input(s): INR, PROTIME in the last 168 hours. Cardiac Enzymes: No results for input(s): CKTOTAL, CKMB, CKMBINDEX, TROPONINI in the last 168 hours. BNP (last 3 results) Recent Labs    07/27/18 1618  PROBNP 150.0*   HbA1C: No results for input(s): HGBA1C in the last 72 hours. CBG: No results for input(s): GLUCAP in the last 168 hours. Lipid Profile: No results for input(s): CHOL, HDL, LDLCALC, TRIG, CHOLHDL, LDLDIRECT in the last 72 hours. Thyroid Function Tests: No results for input(s): TSH, T4TOTAL, FREET4, T3FREE, THYROIDAB in the last 72 hours. Anemia Panel: No results for input(s): VITAMINB12, FOLATE, FERRITIN, TIBC, IRON, RETICCTPCT  in the last 72 hours. Sepsis Labs: No results for input(s): PROCALCITON, LATICACIDVEN in the last 168 hours.  Recent Results (from the past 240 hour(s))  Novel Coronavirus, NAA (Hosp order, Send-out to Ref Lab; TAT 18-24 hrs     Status: None   Collection Time: 12/29/18  4:17 PM   Specimen: Nasopharyngeal Swab; Respiratory  Result Value Ref Range Status   SARS-CoV-2, NAA NOT DETECTED NOT DETECTED Final    Comment: (NOTE) This nucleic acid amplification test was developed and its performance characteristics determined by [employer]. Nucleic acid amplification tests include PCR and TMA. This test has not been FDA cleared or approved. This test has been authorized by FDA under an Emergency Use Authorization (EUA). This test is only authorized for the duration of time the declaration that circumstances exist justifying the authorization of the emergency use of in vitro diagnostic tests for detection of SARS-CoV-2 virus and/or diagnosis of COVID-19 infection under section 564(b)(1) of the Act, 21 U.S.C. [id number]) (1), unless the authorization is terminated  or revoked sooner. When diagnostic testing is negative, the possibility of a false negative result should be considered in the context of a patient's recent exposures and the presence of clinical signs and symptoms consistent with COVID-19. An individual without symptoms of COVID- 19 and who is not shedding SARS-CoV-2 vi rus would expect to have a negative (not detected) result in this assay. Performed At: [facility] [address] [place], [place] [id number] Rush Farmer MD [id number]    [employer]  Final    Comment: Performed at [facility], [place] [address], [place], [place] [address]  SARS CORONAVIRUS 2 (TAT 6-24 HRS) Nasopharyngeal Nasopharyngeal Swab     Status: None   Collection Time: 01/04/19  3:06 PM   Specimen: Nasopharyngeal Swab  Result Value Ref Range Status   SARS Coronavirus 2 NEGATIVE NEGATIVE Final    Comment: (NOTE) SARS-CoV-2 target nucleic acids are NOT DETECTED. The SARS-CoV-2 RNA is generally detectable in upper and lower respiratory specimens during the acute phase of infection. Negative results do not preclude SARS-CoV-2 infection, do not rule out co-infections with other pathogens, and should not be used as the sole basis for treatment or other patient management decisions. Negative results must be combined with clinical observations, patient history, and epidemiological information. The expected result is Negative. Fact Sheet for Patients: SugarRoll.be Fact Sheet for Healthcare Providers: https://www.woods-mathews.com/ This test is not yet approved or cleared by the [place] FDA and  has been authorized for detection and/or diagnosis of SARS-CoV-2 by FDA under an Emergency Use Authorization (EUA). This EUA will remain  in effect (meaning this test can be used) for the duration of the COVID-19 declaration under Section 56 4(b)(1) of the Act, 21 U.S.C. section  360bbb-3(b)(1), unless the authorization is terminated or revoked sooner. Performed at [facility], [place] [address], [place], [place] [address]       Radiology Studies: DG Chest 2 View  Result Date: 01/04/2019 CLINICAL DATA:  Shortness of breath, history of recent cholecystectomy EXAM: CHEST - 2 VIEW COMPARISON:  11/02/2018 FINDINGS: Cardiac shadow is accentuated by the portable technique. Aortic calcifications are noted. Postsurgical changes are seen. Patchy infiltrates are identified bilaterally which are new from the prior exam. Some scarring in the left mid lung as well as chronic left effusion is noted. Common bile duct stents are noted on the margin of the film. Postsurgical changes in the cervical spine are seen. IMPRESSION: Bilateral infiltrative  opacities new from the prior exam consistent with multifocal pneumonia. Chronic changes in the left base. Electronically Signed   By: Inez Catalina M.D.   On: 01/04/2019 13:05   CT Angio Chest PE W/Cm &/Or Wo Cm  Result Date: 01/04/2019 CLINICAL DATA:  Shortness of breath. Recent surgery. Evaluate for pulmonary embolus. EXAM: CT ANGIOGRAPHY CHEST WITH CONTRAST TECHNIQUE: Multidetector CT imaging of the chest was performed using the standard protocol during bolus administration of intravenous contrast. Multiplanar CT image reconstructions and MIPs were obtained to evaluate the vascular anatomy. CONTRAST:  112mL OMNIPAQUE IOHEXOL 350 MG/ML SOLN COMPARISON:  CT chest 04/04/2010 FINDINGS: Cardiovascular: Previous median sternotomy and CABG procedure. Heart size normal. No pericardial effusion. Aortic atherosclerosis. The main pulmonary artery appears patent. There is no central obstructing pulmonary emboli identified bilaterally. No lobar or segmental pulmonary artery filling defects identified. Mediastinum/Nodes: The trachea appears patent and is midline. Normal appearance of the thyroid gland. Normal appearance of the esophagus. No enlarged  mediastinal or hilar adenopathy. Lungs/Pleura: Mild diffuse pleural thickening overlying the left lung is identified. Multifocal bilateral airspace and ground-glass densities are noted throughout both lungs. These findings correspond with the chest radiograph findings from earlier today and are favored to represent multifocal pneumonia. Upper Abdomen: Postoperative changes within the upper abdomen are identified. Biliary drain is noted. Pneumoperitoneum is identified within the upper abdomen, likely postoperative. Musculoskeletal: No chest wall abnormality. No acute or significant osseous findings. Review of the MIP images confirms the above findings. IMPRESSION: 1. No evidence for acute pulmonary embolus. 2. Extensive bilateral ground-glass and airspace opacities are noted throughout both lungs concerning for multi focal pneumonia. 3. Mild diffuse pleural thickening overlies the left lung with decrease in volume of left pleural effusion. Aortic Atherosclerosis (ICD10-I70.0). Electronically Signed   By: Kerby Moors M.D.   On: 01/04/2019 16:59      Scheduled Meds: . amLODipine  2.5 mg Oral Daily  . aspirin  81 mg Oral Pre-Cath  . aspirin EC  81 mg Oral QHS  . ezetimibe  10 mg Oral Daily  . feeding supplement (ENSURE ENLIVE)  237 mL Oral BID BM  . isosorbide mononitrate  60 mg Oral Daily  . nebivolol  10 mg Oral Daily  . pantoprazole  80 mg Oral Daily  . rOPINIRole  2 mg Oral QHS  . rosuvastatin  20 mg Oral Daily  . sodium chloride flush  3 mL Intravenous Once  . sodium chloride flush  3 mL Intravenous Q12H  . sodium chloride flush  3 mL Intravenous Q12H  . venlafaxine  75 mg Oral Daily   Continuous Infusions: . sodium chloride    . sodium chloride    . sodium chloride    . azithromycin    . cefTRIAXone (ROCEPHIN)  IV    . heparin       LOS: 1 day      Time spent: 35 minutes   Dessa Phi, DO [facility] 01/05/2019, 9:47 AM   Available via Epic secure chat  7am-7pm After these hours, please refer to coverage provider listed on [email or web address]

## 2019-01-05 NOTE — Progress Notes (Signed)
ANTICOAGULATION CONSULT NOTE - Initial Consult  Pharmacy Consult for heparin  Indication: chest pain/ACS  Allergies  Allergen Reactions  . Losartan Potassium Other (See Comments)    Hyperkalemia  . Tape Itching and Rash    reddened skin     Patient Measurements: Height: 5\' 9"  (175.3 cm) Weight: 176 lb 1.6 oz (79.9 kg) IBW/kg (Calculated) : 70.7   Vital Signs: Temp: 98.3 F (36.8 C) (12/17 0430) Temp Source: Oral (12/17 0430) BP: 120/72 (12/17 0430) Pulse Rate: 87 (12/17 0430)  Labs: Recent Labs    01/04/19 1153 01/04/19 1327 01/04/19 1720 01/04/19 2115 01/05/19 0354  HGB 12.2*  --   --   --  11.1*  HCT 40.5  --   --   --  34.8*  PLT 174  --   --   --  201  CREATININE  --  1.51*  --   --  1.64*  TROPONINIHS 562* 2,259* 3,189* 4,847*  --     Estimated Creatinine Clearance: 38.3 mL/min (A) (by C-G formula based on SCr of 1.64 mg/dL (H)).   Medical History: Past Medical History:  Diagnosis Date  . Allergy   . B12 deficiency anemia   . Blood transfusion without reported diagnosis   . CAD (coronary artery disease)   . Cancer ([place])    bladder-   . Colon polyps   . COPD (chronic obstructive pulmonary disease) ([place])   . Depression   . Esophagus, Barrett's   . GERD (gastroesophageal reflux disease)   . History of bladder cancer    Bladder cancer "8 times"  . History of hiatal hernia   . Hyperlipidemia   . Hypertension   . Localized osteoarthrosis, lower leg   . Restless leg syndrome   . Sleep apnea    does not wear cpap  . Stenosis of esophagus     Medications:  Medications Prior to Admission  Medication Sig Dispense Refill Last Dose  . acetaminophen (TYLENOL) 325 MG tablet Take 650 mg by mouth every 6 (six) hours as needed for mild pain.   01/03/2019 at Unknown time  . amLODipine (NORVASC) 2.5 MG tablet Take 1 tablet (2.5 mg total) by mouth daily. 180 tablet 3 01/04/2019 at Unknown time  . aspirin EC 81 MG tablet Take 81 mg by mouth at bedtime.    01/04/2019 at Unknown time  . BYSTOLIC 10 MG tablet Take 10 mg by mouth daily.   01/04/2019 at Unknown time  . cyanocobalamin (,VITAMIN B-12,) 1000 MCG/ML injection INJECT 1 ML (CC) INTRAMUSCULARLY FOR 1 DOSE (Patient taking differently: Inject 1,000 mcg into the muscle every 30 (thirty) days. On the 15th of each month) 6 mL 3 01/03/2019 at Unknown time  . dicyclomine (BENTYL) 10 MG capsule Take 1 capsule (10 mg total) by mouth every 8 (eight) hours as needed for spasms. 3060 capsule 1 01/03/2019 at Unknown time  . docusate sodium (COLACE) 100 MG capsule Take 1 capsule (100 mg total) by mouth 2 (two) times daily. OK to decrease to once daily or discontinue if having diarrhea 30 capsule 0 Past Week at Unknown time  . ezetimibe (ZETIA) 10 MG tablet Take 1 tablet (10 mg total) by mouth daily. 90 tablet 1 01/03/2019 at Unknown time  . Fiber Complete TABS Take 1 tablet by mouth daily.   01/03/2019 at Unknown time  . fluticasone (FLONASE) 50 MCG/ACT nasal spray Place 1-2 sprays into both nostrils at bedtime as needed for allergies or rhinitis.   01/03/2019 at  Unknown time  . folic acid (FOLVITE) A999333 MCG tablet Take 400 mcg by mouth daily.     01/03/2019 at Unknown time  . isosorbide mononitrate (IMDUR) 60 MG 24 hr tablet Take 1 tablet (60 mg total) by mouth daily. 90 tablet 1 01/03/2019 at Unknown time  . Menthol, Topical Analgesic, (BENGAY EX) Apply 1 application topically daily as needed (pain).   Past Month at Unknown time  . Multiple Vitamins-Minerals (MULTIVITAMIN WITH MINERALS) tablet Take 1 tablet by mouth daily.   Past Week at Unknown time  . Naphazoline-Pheniramine (OPCON-A) 0.027-0.315 % SOLN Place 1 drop into both eyes daily as needed (itching eyes).   Past Week at Unknown time  . omeprazole (PRILOSEC) 40 MG capsule Take 1 capsule (40 mg total) by mouth 2 (two) times daily after a meal. 90 capsule 3 01/03/2019 at Unknown time  . Probiotic Product (PROBIOTIC ADVANCED PO) Take 1 tablet by mouth 2  (two) times daily.    01/03/2019 at Unknown time  . rOPINIRole (REQUIP) 2 MG tablet TAKE 1 TABLET BY MOUTH AT BEDTIME (Patient taking differently: Take 2 mg by mouth at bedtime. ) 90 tablet 0 01/03/2019 at Unknown time  . rosuvastatin (CRESTOR) 20 MG tablet Take 20 mg by mouth daily.   01/03/2019 at Unknown time  . traMADol (ULTRAM) 50 MG tablet Take 1 tablet (50 mg total) by mouth every 6 (six) hours as needed. 20 tablet 0 Past Week at Unknown time  . venlafaxine (EFFEXOR) 75 MG tablet Take 1 tablet (75 mg total) by mouth daily. 90 tablet 1 01/03/2019 at Unknown time  . NEEDLE, DISP, 25 G (B-D DISP NEEDLE 25GX1") 25G X 1" MISC Inject 1000 mcg into muscle once a month. 50 each 0 unk  . nitroGLYCERIN (NITROSTAT) 0.4 MG SL tablet Place 1 tablet (0.4 mg total) under the tongue every 5 (five) minutes as needed for chest pain. 30 tablet 3 unk   Scheduled:  . amLODipine  2.5 mg Oral Daily  . aspirin EC  81 mg Oral QHS  . ezetimibe  10 mg Oral Daily  . feeding supplement (ENSURE ENLIVE)  237 mL Oral BID BM  . isosorbide mononitrate  60 mg Oral Daily  . nebivolol  10 mg Oral Daily  . pantoprazole  80 mg Oral Daily  . rOPINIRole  2 mg Oral QHS  . rosuvastatin  20 mg Oral Daily  . sodium chloride flush  3 mL Intravenous Once  . sodium chloride flush  3 mL Intravenous Q12H  . venlafaxine  75 mg Oral Daily    Assessment: 78 yo male with elevated troponin and history of CAD to start heparin.  -heparin sq given ~ 7am today -Hg= 1.6  Goal of Therapy:  Heparin level 0.3-0.7 units/ml Monitor platelets by anticoagulation protocol: Yes   Plan:  -No heparin bolus -Begin heparin at 1000 units/hr -Heparin level in 8 hours and daily wth CBC daily  Hildred Laser, PharmD Clinical Pharmacist **Pharmacist phone directory can now be found on [email or web address] ([email or web address]).  Listed under [place].

## 2019-01-06 ENCOUNTER — Telehealth: Payer: Self-pay | Admitting: Gastroenterology

## 2019-01-06 DIAGNOSIS — I214 Non-ST elevation (NSTEMI) myocardial infarction: Secondary | ICD-10-CM

## 2019-01-06 LAB — BASIC METABOLIC PANEL
Anion gap: 12 (ref 5–15)
BUN: 24 mg/dL — ABNORMAL HIGH (ref 8–23)
CO2: 25 mmol/L (ref 22–32)
Calcium: 8.3 mg/dL — ABNORMAL LOW (ref 8.9–10.3)
Chloride: 104 mmol/L (ref 98–111)
Creatinine, Ser: 1.3 mg/dL — ABNORMAL HIGH (ref 0.61–1.24)
GFR calc Af Amer: >60 mL/min (ref >60)
GFR calc non Af Amer: 53 mL/min — ABNORMAL LOW (ref >60)
Glucose, Bld: 127 mg/dL — ABNORMAL HIGH (ref 70–99)
Potassium: 3.8 mmol/L (ref 3.5–5.1)
Sodium: 141 mmol/L (ref 135–145)

## 2019-01-06 LAB — CBC
HCT: 29 % — ABNORMAL LOW (ref 39.0–52.0)
Hemoglobin: 9.2 g/dL — ABNORMAL LOW (ref 13.0–17.0)
MCH: 27.5 pg (ref 26.0–34.0)
MCHC: 31.7 g/dL (ref 30.0–36.0)
MCV: 86.8 fL (ref 80.0–100.0)
Platelets: 174 10*3/uL (ref 150–400)
RBC: 3.34 MIL/uL — ABNORMAL LOW (ref 4.22–5.81)
RDW: 14.3 % (ref 11.5–15.5)
WBC: 8.1 10*3/uL (ref 4.0–10.5)
nRBC: 0 % (ref 0.0–0.2)

## 2019-01-06 MED ORDER — AZITHROMYCIN 250 MG PO TABS
250.0000 mg | ORAL_TABLET | Freq: Every day | ORAL | Status: DC
  Administered 2019-01-06 – 2019-01-08 (×3): 250 mg via ORAL
  Filled 2019-01-06 (×3): qty 1

## 2019-01-06 NOTE — Progress Notes (Signed)
Progress Note  Patient Name: SAYGE MCDONNELL Date of Encounter: 01/06/2019  Primary Cardiologist: [name], MD   Subjective   No Chest discomfort, ambulating fairly comfortably No abdominal tenderness  Inpatient Medications    Scheduled Meds: . amLODipine  2.5 mg Oral Daily  . aspirin EC  81 mg Oral QHS  . clopidogrel  75 mg Oral Q breakfast  . ezetimibe  10 mg Oral Daily  . feeding supplement (ENSURE ENLIVE)  237 mL Oral BID BM  . heparin  5,000 Units Subcutaneous Q8H  . isosorbide mononitrate  60 mg Oral Daily  . nebivolol  10 mg Oral Daily  . pantoprazole  80 mg Oral Daily  . rOPINIRole  2 mg Oral QHS  . rosuvastatin  20 mg Oral Daily  . sodium chloride flush  3 mL Intravenous Once  . sodium chloride flush  3 mL Intravenous Q12H  . sodium chloride flush  3 mL Intravenous Q12H  . venlafaxine  75 mg Oral Daily   Continuous Infusions: . sodium chloride    . sodium chloride    . azithromycin 250 mg (01/05/19 1803)  . cefTRIAXone (ROCEPHIN)  IV 1 g (01/05/19 1930)   PRN Meds: sodium chloride, sodium chloride, acetaminophen **OR** acetaminophen, alum & mag hydroxide-simeth, ondansetron **OR** ondansetron (ZOFRAN) IV, sodium chloride flush, sodium chloride flush, traMADol   Vital Signs    Vitals:   01/05/19 2008 01/05/19 2211 01/06/19 0400 01/06/19 0429  BP: (!) 144/74   125/68  Pulse: 95 98  81  Resp: (!) 27 20  20   Temp: 99.8 F (37.7 C)   98.9 F (37.2 C)  TempSrc: Oral   Oral  SpO2: 94% 94%  96%  Weight:   78.7 kg   Height:        Intake/Output Summary (Last 24 hours) at 01/06/2019 0940 Last data filed at 01/05/2019 2359 Gross per 24 hour  Intake 955 ml  Output 100 ml  Net 855 ml   Last 3 Weights 01/06/2019 01/05/2019 01/02/2019  Weight (lbs) 173 lb 8 oz 176 lb 1.6 oz 180 lb  Weight (kg) 78.7 kg 79.878 kg 81.647 kg      Telemetry    Sinus rhythm no other adverse arrhythmias- Personally Reviewed, no change from prior  ECG    Sinus  rhythm with ST depression noted in the precordial leads- Personally Reviewed  Physical Exam  GEN: Well nourished, well developed, in no acute distress  HEENT: normal  Neck: no JVD, carotid bruits, or masses Cardiac: RRR; no murmurs, rubs, or gallops,no edema  Respiratory:  clear to auscultation bilaterally, normal work of breathing GI: soft, nontender, nondistended, + BS MS: no deformity or atrophy  Skin: warm and dry, no rash Neuro:  Alert and Oriented x 3, Strength and sensation are intact Psych: euthymic mood, full affect   Labs    High Sensitivity Troponin:   Recent Labs  Lab 01/04/19 1153 01/04/19 1327 01/04/19 1720 01/04/19 2115  TROPONINIHS 562* 2,259* 3,189* 4,847*      Chemistry Recent Labs  Lab 01/04/19 1327 01/05/19 0354 01/06/19 0323  NA 141 144 141  K 3.9 4.8 3.8  CL 103 101 104  CO2 27 29 25   GLUCOSE 123* 152* 127*  BUN 25* 27* 24*  CREATININE 1.51* 1.64* 1.30*  CALCIUM 9.1 9.0 8.3*  PROT 7.6  --   --   ALBUMIN 3.1*  --   --   AST 42*  --   --  ALT 25  --   --   ALKPHOS 66  --   --   BILITOT 0.8  --   --   GFRNONAA 44* 40* 53*  GFRAA 51* 46* >60  ANIONGAP 11 14 12      Hematology Recent Labs  Lab 01/04/19 1153 01/05/19 0354 01/06/19 0323  WBC 22.8* 15.5* 8.1  RBC 4.35 3.98* 3.34*  HGB 12.2* 11.1* 9.2*  HCT 40.5 34.8* 29.0*  MCV 93.1 87.4 86.8  MCH 28.0 27.9 27.5  MCHC 30.1 31.9 31.7  RDW 14.5 14.5 14.3  PLT 174 201 174    BNP Recent Labs  Lab 01/04/19 1327  BNP 1,392.1*     DDimer No results for input(s): DDIMER in the last 168 hours.   Radiology    DG Chest 2 View  Result Date: 01/04/2019 CLINICAL DATA:  Shortness of breath, history of recent cholecystectomy EXAM: CHEST - 2 VIEW COMPARISON:  11/02/2018 FINDINGS: Cardiac shadow is accentuated by the portable technique. Aortic calcifications are noted. Postsurgical changes are seen. Patchy infiltrates are identified bilaterally which are new from the prior exam. Some  scarring in the left mid lung as well as chronic left effusion is noted. Common bile duct stents are noted on the margin of the film. Postsurgical changes in the cervical spine are seen. IMPRESSION: Bilateral infiltrative opacities new from the prior exam consistent with multifocal pneumonia. Chronic changes in the left base. Electronically Signed   By: Inez Catalina M.D.   On: 01/04/2019 13:05   CT Angio Chest PE W/Cm &/Or Wo Cm  Result Date: 01/04/2019 CLINICAL DATA:  Shortness of breath. Recent surgery. Evaluate for pulmonary embolus. EXAM: CT ANGIOGRAPHY CHEST WITH CONTRAST TECHNIQUE: Multidetector CT imaging of the chest was performed using the standard protocol during bolus administration of intravenous contrast. Multiplanar CT image reconstructions and MIPs were obtained to evaluate the vascular anatomy. CONTRAST:  110mL OMNIPAQUE IOHEXOL 350 MG/ML SOLN COMPARISON:  CT chest 04/04/2010 FINDINGS: Cardiovascular: Previous median sternotomy and CABG procedure. Heart size normal. No pericardial effusion. Aortic atherosclerosis. The main pulmonary artery appears patent. There is no central obstructing pulmonary emboli identified bilaterally. No lobar or segmental pulmonary artery filling defects identified. Mediastinum/Nodes: The trachea appears patent and is midline. Normal appearance of the thyroid gland. Normal appearance of the esophagus. No enlarged mediastinal or hilar adenopathy. Lungs/Pleura: Mild diffuse pleural thickening overlying the left lung is identified. Multifocal bilateral airspace and ground-glass densities are noted throughout both lungs. These findings correspond with the chest radiograph findings from earlier today and are favored to represent multifocal pneumonia. Upper Abdomen: Postoperative changes within the upper abdomen are identified. Biliary drain is noted. Pneumoperitoneum is identified within the upper abdomen, likely postoperative. Musculoskeletal: No chest wall abnormality. No  acute or significant osseous findings. Review of the MIP images confirms the above findings. IMPRESSION: 1. No evidence for acute pulmonary embolus. 2. Extensive bilateral ground-glass and airspace opacities are noted throughout both lungs concerning for multi focal pneumonia. 3. Mild diffuse pleural thickening overlies the left lung with decrease in volume of left pleural effusion. Aortic Atherosclerosis (ICD10-I70.0). Electronically Signed   By: Kerby Moors M.D.   On: 01/04/2019 16:59   CARDIAC CATHETERIZATION  Result Date: 01/05/2019 Conclusions: 1. Severe native coronary artery disease, including 75% ostial LMCA stenosis, 50% mid LAD disease, sequential 90% and 70% proximal/mid LCx stenoses as well as 70% mid OM1 lesion, and sequential 70% ostial, 80% proximal, and 95% distal RCA stenoses. 2. Widely patent LIMA-LAD. 3. Chronically occluded  SVG-OM1. 4. Subtotally occluded SVG-rPDA-rPL with 99% mid graft stenosis followed by extensive thrombus, some of which has embolized into the distal branches. 5. Upper normal left ventricular filling pressure. 6. Successful PCI to SVG-rPDA-rPL using overlapping Resolute Onyx 3.0 x 18 mm, 3.0 x 38 mm, and 3.0 x 38 mm drug-eluting stents with 0% residual stenosis and TIMI-3 flow.  Small amount of nonocclusive thrombus remained in the rPL branch at the end of the procedure. Recommendations: 1. Indefinite dual antiplatelet therapy with aspirin and clopidogrel, as tolerated. 2. Medical therapy of protected LMCA and LCx disease.  If the patient has refractory symptoms, complex PCI of the ostial LMCA, LCx, and OM1 would need to be considered. 3. Aggressive secondary prevention. 4. Gentle post-catheterization hydration with close monitoring of renal function and hemoglobin, given contrast exposure and glycoprotein IIb/IIIa use during the intervention. Nelva Bush, MD [facility]   ECHOCARDIOGRAM COMPLETE  Result Date: 01/05/2019   ECHOCARDIOGRAM REPORT   Patient  Name:   ALBARO JOVE Date of Exam: 01/05/2019 Medical Rec #:  [id number]         Height:       69.0 in Accession #:    [id number]        Weight:       176.1 lb Date of Birth:  10-Jan-1941         BSA:          1.96 m Patient Age:    78 years          BP:           120/72 mmHg Patient Gender: M                 HR:           91 bpm. Exam Location:  Inpatient Procedure: 2D Echo, Cardiac Doppler and Color Doppler Indications:    Dyspnea 786.09 / R06.00  History:        Patient has prior history of Echocardiogram examinations, most                 recent 12/23/2017. CAD, COPD; Risk Factors:Hypertension,                 Dyslipidemia, Sleep Apnea and GERD. Cancer.  Sonographer:    Jonelle Sidle Dance Referring Phys: [id number] [place]  1. Left ventricular ejection fraction, by visual estimation, is 40 to 45%. The left ventricle has mild to moderately decreased function. There is mildly increased left ventricular hypertrophy.  2. Severe hypokinesis of the left ventricular, entire inferior wall and inferoseptal wall.  3. Elevated left atrial and left ventricular end-diastolic pressures.  4. Left ventricular diastolic parameters are consistent with Grade I diastolic dysfunction (impaired relaxation).  5. The left ventricle demonstrates regional wall motion abnormalities.  6. Global right ventricle has mildly reduced systolic function.The right ventricular size is normal. No increase in right ventricular wall thickness.  7. Left atrial size was moderately dilated.  8. Right atrial size was normal.  9. The mitral valve is abnormal. Mild mitral valve regurgitation. Posterior leaflet tethering is suspected. 10. The tricuspid valve is grossly normal. Tricuspid valve regurgitation is trivial. 11. The aortic valve is tricuspid. Aortic valve regurgitation is trivial. Mild aortic valve sclerosis without stenosis. 12. The pulmonic valve was grossly normal. Pulmonic valve regurgitation is not visualized. 13. Possible atrial  shunt by color doppler. 14. Cannot exclude ASD/PFO. Consider transesophageal echocardiogram, if clinically indicated. 15. The inferior vena cava is normal  in size with greater than 50% respiratory variability, suggesting right atrial pressure of 3 mmHg. 16. The left ventricular function has worsened. In comparison to the previous echocardiogram(s): The left ventricular function is worsened. LVEF 60-65% in 2019. FINDINGS  Left Ventricle: Left ventricular ejection fraction, by visual estimation, is 40 to 45%. The left ventricle has mild to moderately decreased function. Severe hypokinesis of the left ventricular, entire inferior wall and inferoseptal wall. The left ventricle demonstrates regional wall motion abnormalities. There is mildly increased left ventricular hypertrophy. Left ventricular diastolic parameters are consistent with Grade I diastolic dysfunction (impaired relaxation). Elevated left atrial and left ventricular end-diastolic pressures. Right Ventricle: The right ventricular size is normal. No increase in right ventricular wall thickness. Global RV systolic function is has mildly reduced systolic function. Left Atrium: Left atrial size was moderately dilated. Right Atrium: Right atrial size was normal in size Pericardium: There is no evidence of pericardial effusion. Mitral Valve: The mitral valve is abnormal. Mild mitral valve regurgitation. Tricuspid Valve: The tricuspid valve is grossly normal. Tricuspid valve regurgitation is trivial. Aortic Valve: The aortic valve is tricuspid. Aortic valve regurgitation is trivial. Mild aortic valve sclerosis is present, with no evidence of aortic valve stenosis. Pulmonic Valve: The pulmonic valve was grossly normal. Pulmonic valve regurgitation is not visualized. Pulmonic regurgitation is not visualized. Aorta: The aortic root and ascending aorta are structurally normal, with no evidence of dilitation. Venous: The inferior vena cava is normal in size with  greater than 50% respiratory variability, suggesting right atrial pressure of 3 mmHg. IAS/Shunts: Possible atrial shunt by color doppler. Additional Comments: Flow jet at the atrial septum was seen, possible venous return; however, could not exclude ASD/PFO. If clinically indicated consider transesophageal echocardiogram.  LEFT VENTRICLE PLAX 2D LVIDd:         5.15 cm  Diastology LVIDs:         4.15 cm  LV e' lateral:   7.51 cm/s LV PW:         1.20 cm  LV E/e' lateral: 16.6 LV IVS:        1.03 cm  LV e' medial:    4.90 cm/s LVOT diam:     2.40 cm  LV E/e' medial:  25.5 LV SV:         50 ml LV SV Index:   25.33 LVOT Area:     4.52 cm  RIGHT VENTRICLE            IVC RV Basal diam:  2.66 cm    IVC diam: 1.18 cm RV S prime:     9.36 cm/s TAPSE (M-mode): 1.2 cm LEFT ATRIUM             Index       RIGHT ATRIUM           Index LA diam:        5.40 cm 2.76 cm/m  RA Area:     13.90 cm LA Vol (A2C):   58.5 ml 29.89 ml/m RA Volume:   32.80 ml  16.76 ml/m LA Vol (A4C):   84.6 ml 43.22 ml/m LA Biplane Vol: 73.8 ml 37.71 ml/m  AORTIC VALVE LVOT Vmax:   88.60 cm/s LVOT Vmean:  59.800 cm/s LVOT VTI:    0.190 m  AORTA Ao Root diam: 3.70 cm Ao Asc diam:  3.10 cm MITRAL VALVE MV Area (PHT): 4.80 cm              SHUNTS MV PHT:  45.82 msec            Systemic VTI:  0.19 m MV Decel Time: 158 msec              Systemic Diam: 2.40 cm MV E velocity: 125.00 cm/s 103 cm/s MV A velocity: 123.00 cm/s 70.3 cm/s MV E/A ratio:  1.02        1.5  Lyman Bishop MD Electronically signed by Lyman Bishop MD Signature Date/Time: 01/05/2019/10:24:39 AM    Final     Cardiac Studies   Echo currently being performed.  Myoview 2019-EF 51% with no ischemia or infarct  Echo 2019-EF 60 to 65% otherwise unremarkable  Patient Profile     78 y.o. male with CAD post CABG 2001 LIMA to LAD vein graft to marginal branch vein graft to right coronary artery with recent cholecystectomy laparoscopic on Monday here with bilateral pneumonia  shortness of breath and non-ST elevation myocardial infarction possibly type II MI with rising troponin.  Also has underlying COPD obstructive sleep apnea  Assessment & Plan    Non-ST elevation myocardial infarction CAD post CABG Bilateral pneumonia Chronic kidney disease 3 COPD Cholecystectomy Monday laparoscopic Acute on chronic diastolic heart failure  -He did have cholecystectomy on Monday laparoscopically. Continue to monitor hemoglobin. It is down from yesterday. He is on dual antiplatelet therapy currently with Plavix and aspirin. We did not utilize Aggrastat post catheterization. 3 stents placed to the SVG to RCA graft. Large thrombus burden. Troponin 4000.  -Continue with goal-directed therapy. -Repeat CBC tomorrow, overall appears comfortable. No abdominal tenderness. Appreciate surgery's note.     For questions or updates, please contact [place] Please consult [email or web address] for contact info under        Signed, Candee Furbish, MD  01/06/2019, 9:40 AM

## 2019-01-06 NOTE — Telephone Encounter (Signed)
The pt CT scan for 12/22 has been cancelled-the pt is currently hospitalized.  They will keep appt for 12/31 to speak with Dr Rush Landmark.

## 2019-01-06 NOTE — Progress Notes (Signed)
PHARMACIST - PHYSICIAN COMMUNICATION ° °CONCERNING: Antibiotic IV to Oral Route Change Policy ° °RECOMMENDATION: °This patient is receiving azithromycin by the intravenous route.  Based on criteria approved by the Pharmacy and Therapeutics Committee, the antibiotic(s) is/are being converted to the equivalent oral dose form(s). ° ° °DESCRIPTION: °These criteria include: °Patient being treated for a respiratory tract infection, urinary tract infection, cellulitis or clostridium difficile associated diarrhea if on metronidazole °The patient is not neutropenic and does not exhibit a GI malabsorption state °The patient is eating (either orally or via tube) and/or has been taking other orally administered medications for a least 24 hours °The patient is improving clinically and has a Tmax < 100.5 ° °If you have questions about this conversion, please contact the Pharmacy Department  °[]  ( [phone] )  [place] °[]  ( [phone] )  [facility] °[x]  ( [phone] )  [place] °[]  ( [phone] )  [facility] °[]  ( [phone] )  [facility]   ° ° °Tailey Top, PharmD °Clinical Pharmacist °**Pharmacist phone directory can now be found on [email or web address] (PW [email or web address]).  Listed under [facility]. ° ° °

## 2019-01-06 NOTE — Progress Notes (Addendum)
CARDIAC REHAB PHASE I   PRE:  Rate/Rhythm: 87 SR  BP:  Supine:   Sitting: 124/72  Standing:    SaO2: 3L 97%  MODE:  Ambulation: 240 ft   POST:  Rate/Rhythm: 102 ST  BP:  Supine:   Sitting: 130/80  Standing:    SaO2: 85% RA, 87% 2L 95% 3L [id number] Pt assisted to bathroom and then he walked 240 ft on oxygen, rolling walker and asst x 1. Took 3L to keep sats up. No CP. Tired by end of walk. MI education started. Discussed the importance of plavix with stents. Reviewed NTG use, MI restrictions, gave diabetic and heart healthy diets,and discussed CRP 2. Referred to [place] CRP 2. Pt stated he attended after CABG. Pt does not have smart phone but has IPAD for virtual ex. To sitting on side of bed with bed alarm. Wrote qualifying note for oxygen if needed. Pt is interested in participating in Virtual Cardiac and Pulmonary Rehab. Pt advised that Virtual Cardiac and Pulmonary Rehab is provided at no cost to the patient.  Checklist:  1. Pt has smart device  ie smartphone and/or ipad for downloading an app  Yes 2. Reliable internet/wifi service    Yes 3. Understands how to use their smartphone and navigate within an app.  Yes   Pt verbalized understanding and is in agreement.    Graylon Good, RN BSN  01/06/2019 9:18 AM

## 2019-01-06 NOTE — Evaluation (Signed)
Physical Therapy Evaluation Patient Details Name: [name] MRN: [id number] DOB: 1940/09/29 Today's Date: 01/06/2019   History of Present Illness  78 year old male presents to ED with sudden onset of SOB. PMH includes: CAD, bladder Ca, HTN , COPD, recently here with acute billiary pancreatitis with dilated common bile duct ERCP with sphincterotomy and stent placement.  Clinical Impression  Patient received sitting up on side of bed with daughter present in room. He is pleasant and agrees to PT assessment. Reports he has had mild shortness of breath for years but this was different. Patient received on 3 lpm O2 with sats 98%-100% at rest. Removed supplemental O2 while at rest, sats remained in mid 90%s. Walked 150 feet on room air without ad and min guard. O2 saturations remained >90 throughout session. Patient reports 6/10 RPE. He will continue to benefit from skilled PT while here to improve mobility and activity tolerance.           Follow Up Recommendations Home health PT    Equipment Recommendations  None recommended by PT    Recommendations for Other Services       Precautions / Restrictions Precautions Precaution Comments: mod fall Restrictions Weight Bearing Restrictions: No      Mobility  Bed Mobility               General bed mobility comments: patient seated on side of bed  Transfers Overall transfer level: Modified independent Equipment used: None                Ambulation/Gait Ambulation/Gait assistance: Min guard Gait Distance (Feet): 150 Feet Assistive device: None Gait Pattern/deviations: WFL(Within Functional Limits) Gait velocity: slightly decreased   General Gait Details: steady with ambulation, no LOB  Stairs            Wheelchair Mobility    Modified Rankin (Stroke Patients Only)       Balance Overall balance assessment: Mild deficits observed, not formally tested;Independent                                            Pertinent Vitals/Pain Pain Assessment: No/denies pain    Home Living Family/patient expects to be discharged to:: Private residence Living Arrangements: Alone Available Help at Discharge: Family;Available PRN/intermittently Type of Home: House Home Access: Stairs to enter Entrance Stairs-Rails: None Entrance Stairs-Number of Steps: 2 Home Layout: Multi-level;Able to live on main level with bedroom/bathroom Home Equipment: Shower seat - built in      Prior Function Level of Independence: Independent         Comments: Drives, independent at baseline however reports he has had SOB for years     Hand Dominance   Dominant Hand: Right    Extremity/Trunk Assessment   Upper Extremity Assessment Upper Extremity Assessment: Overall WFL for tasks assessed    Lower Extremity Assessment Lower Extremity Assessment: Overall WFL for tasks assessed    Cervical / Trunk Assessment Cervical / Trunk Assessment: Normal  Communication   Communication: No difficulties  Cognition Arousal/Alertness: Awake/alert Behavior During Therapy: WFL for tasks assessed/performed Overall Cognitive Status: Within Functional Limits for tasks assessed                                        General Comments  Exercises     Assessment/Plan    PT Assessment Patient needs continued PT services  PT Problem List Decreased strength;Decreased activity tolerance;Decreased mobility       PT Treatment Interventions Therapeutic activities;Gait training;Functional mobility training;Stair training;Therapeutic exercise;Patient/family education    PT Goals (Current goals can be found in the Care Plan section)  Acute Rehab PT Goals Patient Stated Goal: to return home PT Goal Formulation: With patient/family Time For Goal Achievement: 01/13/19 Potential to Achieve Goals: Good    Frequency Min 3X/week   Barriers to discharge Decreased caregiver support daughter  lives next door but she works.    Co-evaluation               AM-PAC PT "6 Clicks" Mobility  Outcome Measure Help needed turning from your back to your side while in a flat bed without using bedrails?: None Help needed moving from lying on your back to sitting on the side of a flat bed without using bedrails?: None Help needed moving to and from a bed to a chair (including a wheelchair)?: A Little Help needed standing up from a chair using your arms (e.g., wheelchair or bedside chair)?: None Help needed to walk in hospital room?: A Little Help needed climbing 3-5 steps with a railing? : A Little 6 Click Score: 21    End of Session Equipment Utilized During Treatment: Gait belt Activity Tolerance: Patient tolerated treatment well Patient left: in bed;with call bell/phone within reach;with family/visitor present;with bed alarm set Nurse Communication: Mobility status PT Visit Diagnosis: Muscle weakness (generalized) (M62.81);Difficulty in walking, not elsewhere classified (R26.2)    Time: [id number] PT Time Calculation (min) (ACUTE ONLY): 15 min   Charges:   PT Evaluation $PT Eval Moderate Complexity: 1 Mod PT Treatments $Gait Training: 8-22 mins        Naszir Cott, PT, GCS 01/06/19,3:35 PM

## 2019-01-06 NOTE — Progress Notes (Addendum)
SATURATION QUALIFICATIONS: (This note is used to comply with regulatory documentation for home oxygen)  Patient Saturations on Room Air at Rest = 89%  Patient Saturations on Room Air while Ambulating = 85%  Patient Saturations on 3 Liters of oxygen while Ambulating = 95%  Please briefly explain why patient needs home oxygen: needs oxygen to keep sats up walking

## 2019-01-06 NOTE — Progress Notes (Signed)
PROGRESS NOTE    [name]  [id number] DOB: 02-07-1940 DOA: 01/04/2019 PCP: Isaac Bliss, Rayford Halsted, MD     Brief Narrative:  [name] is a 78 y.o. male with medical history significant of coronary artery disease, COPD, hypertension, hyperlipidemia with recent cholecystectomy who presents with sudden onset of shortness of breath.  He had an uncomplicated surgical procedure 2 days ago, woke up this morning feeling short of breath and could not walk a short distance in his house.  He admits to feeling hot and cold but did not take a temperature at home, admits to a productive cough, shortness of breath but no chest pain, no nausea and vomiting, improved abdominal pain postsurgically, and continued diarrhea. Labs obtained which revealed creatinine stable at 1.5, BNP 1392, troponin  162 which subsequently elevated to 2259, WBC 22.8.  Covid antigen testing was negative, PCR pending.  CTA chest was negative for PE but did reveal multifocal pneumonia.  Cardiology also consulted for elevated troponin.  Ultimately patient underwent heart catheterization and required PCI.  New events last 24 hours / Subjective: No complaints of chest pain, still having some shortness of breath.  Cough has resolved.  Assessment & Plan:   Principal Problem:   Acute hypoxemic respiratory failure (HCC) Active Problems:   Depression   Essential hypertension   Restless leg syndrome   Hx of CABG   Hyperlipidemia LDL goal <70   GERD (gastroesophageal reflux disease)   CKD (chronic kidney disease), stage III   CAD (coronary artery disease)   Multifocal pneumonia   Severe sepsis (HCC)   Elevated troponin   Acute on chronic diastolic (congestive) heart failure (HCC)   Acute respiratory failure (HCC)   Non-ST elevation (NSTEMI) myocardial infarction ([place])   Acute hypoxemic respiratory failure -CTA chest negative for PE, but did show extensive bilateral ground-glass and airspace opacities are  noted throughout both lungs concerning for multi focal pneumonia. -Required NRB at presentation to ED, now weaned to 3L  O2 -Continue to wean oxygen as able to room air  Severe sepsis secondary to multifocal pneumonia, sepsis POA, with elevated troponin and respiratory failure  -Covid negative  -Rocephin/Azithromycin  -Leukocytosis resolved, afebrile overnight  NSTEMI CAD s/p CABG  -Appreciate cardiology consult  -Status post heart cath with PCI -Continue aspirin, Plavix, imdur, crestor   Acute on chronic systolic and diastolic HF -Appreciate cardiology consult  -Echocardiogram shows EF 40 to 45%, severe hypokinesis left ventricular, inferior wall, inferior septal wall  HTN -Amlodipine, Bystolic  HLD -Crestor, Zetia  CKD stage 3b  -Baseline Cr ~1.6-1.7 -Stable   S/p cholecystectomy  -Stable   Depression -Continue Effexor  GERD, hx Barrett's esophagus  -PPI   DVT prophylaxis: Subq hep  Code Status: Full Family Communication: No family at bedside Disposition Plan: Pending continued medical stabilization, clinical improvement   Consultants:   Cardiology  Procedures:   Heart cath 12/17    Antimicrobials:  Anti-infectives (From admission, onward)   Start     Dose/Rate Route Frequency Ordered Stop   01/06/19 1000  azithromycin (ZITHROMAX) tablet 250 mg     250 mg Oral Daily 01/06/19 0954     01/05/19 1830  azithromycin (ZITHROMAX) 250 mg in dextrose 5 % 125 mL IVPB  Status:  Discontinued     250 mg 125 mL/hr over 60 Minutes Intravenous Every 24 hours 01/04/19 1819 01/06/19 0954   01/05/19 1730  cefTRIAXone (ROCEPHIN) 1 g in sodium chloride 0.9 % 100 mL IVPB  1 g 200 mL/hr over 30 Minutes Intravenous Every 24 hours 01/04/19 1819     01/04/19 1830  azithromycin (ZITHROMAX) 500 mg in sodium chloride 0.9 % 250 mL IVPB     500 mg 250 mL/hr over 60 Minutes Intravenous  Once 01/04/19 1825 01/04/19 1936   01/04/19 1715  cefTRIAXone (ROCEPHIN) 1 g in  sodium chloride 0.9 % 100 mL IVPB     1 g 200 mL/hr over 30 Minutes Intravenous  Once 01/04/19 1711 01/04/19 1825   01/04/19 1715  azithromycin (ZITHROMAX) 500 mg in sodium chloride 0.9 % 250 mL IVPB  Status:  Discontinued     500 mg 250 mL/hr over 60 Minutes Intravenous  Once 01/04/19 1711 01/04/19 1825       Objective: Vitals:   01/05/19 2008 01/05/19 2211 01/06/19 0400 01/06/19 0429  BP: (!) 144/74   125/68  Pulse: 95 98  81  Resp: (!) 27 20  20   Temp: 99.8 F (37.7 C)   98.9 F (37.2 C)  TempSrc: Oral   Oral  SpO2: 94% 94%  96%  Weight:   78.7 kg   Height:        Intake/Output Summary (Last 24 hours) at 01/06/2019 1037 Last data filed at 01/05/2019 2359 Gross per 24 hour  Intake 955 ml  Output 100 ml  Net 855 ml   Filed Weights   01/05/19 0000 01/06/19 0400  Weight: 79.9 kg 78.7 kg    Examination: General exam: Appears calm and comfortable  Respiratory system: Mild crackles throughout, worse in right lower base, on 3 L nasal cannula O2 without acute respiratory distress Cardiovascular system: S1 & S2 heard, RRR. No pedal edema. Gastrointestinal system: Abdomen is nondistended, soft and nontender. Normal bowel sounds heard. Central nervous system: Alert and oriented. Non focal exam. Speech clear  Extremities: Symmetric in appearance bilaterally  Skin: No rashes, lesions or ulcers on exposed skin  Psychiatry: Judgement and insight appear stable. Mood & affect appropriate.    Data Reviewed: I have personally reviewed following labs and imaging studies  CBC: Recent Labs  Lab 01/04/19 1153 01/05/19 0354 01/06/19 0323  WBC 22.8* 15.5* 8.1  HGB 12.2* 11.1* 9.2*  HCT 40.5 34.8* 29.0*  MCV 93.1 87.4 86.8  PLT 174 201 [id number]   Basic Metabolic Panel: Recent Labs  Lab 01/04/19 1327 01/05/19 0354 01/06/19 0323  NA 141 144 141  K 3.9 4.8 3.8  CL 103 101 104  CO2 27 29 25   GLUCOSE 123* 152* 127*  BUN 25* 27* 24*  CREATININE 1.51* 1.64* 1.30*  CALCIUM 9.1  9.0 8.3*   GFR: Estimated Creatinine Clearance: 48.3 mL/min (A) (by C-G formula based on SCr of 1.3 mg/dL (H)). Liver Function Tests: Recent Labs  Lab 01/04/19 1327  AST 42*  ALT 25  ALKPHOS 66  BILITOT 0.8  PROT 7.6  ALBUMIN 3.1*   No results for input(s): LIPASE, AMYLASE in the last 168 hours. No results for input(s): AMMONIA in the last 168 hours. Coagulation Profile: No results for input(s): INR, PROTIME in the last 168 hours. Cardiac Enzymes: No results for input(s): CKTOTAL, CKMB, CKMBINDEX, TROPONINI in the last 168 hours. BNP (last 3 results) Recent Labs    07/27/18 1618  PROBNP 150.0*   HbA1C: No results for input(s): HGBA1C in the last 72 hours. CBG: No results for input(s): GLUCAP in the last 168 hours. Lipid Profile: No results for input(s): CHOL, HDL, LDLCALC, TRIG, CHOLHDL, LDLDIRECT in the last 72 hours. Thyroid  Function Tests: No results for input(s): TSH, T4TOTAL, FREET4, T3FREE, THYROIDAB in the last 72 hours. Anemia Panel: No results for input(s): VITAMINB12, FOLATE, FERRITIN, TIBC, IRON, RETICCTPCT in the last 72 hours. Sepsis Labs: No results for input(s): PROCALCITON, LATICACIDVEN in the last 168 hours.  Recent Results (from the past 240 hour(s))  Novel Coronavirus, NAA (Hosp order, Send-out to Ref Lab; TAT 18-24 hrs     Status: None   Collection Time: 12/29/18  4:17 PM   Specimen: Nasopharyngeal Swab; Respiratory  Result Value Ref Range Status   SARS-CoV-2, NAA NOT DETECTED NOT DETECTED Final    Comment: (NOTE) This nucleic acid amplification test was developed and its performance characteristics determined by [employer]. Nucleic acid amplification tests include PCR and TMA. This test has not been FDA cleared or approved. This test has been authorized by FDA under an Emergency Use Authorization (EUA). This test is only authorized for the duration of time the declaration that circumstances exist justifying the authorization of the  emergency use of in vitro diagnostic tests for detection of SARS-CoV-2 virus and/or diagnosis of COVID-19 infection under section 564(b)(1) of the Act, 21 U.S.C. [id number]) (1), unless the authorization is terminated or revoked sooner. When diagnostic testing is negative, the possibility of a false negative result should be considered in the context of a patient's recent exposures and the presence of clinical signs and symptoms consistent with COVID-19. An individual without symptoms of COVID- 19 and who is not shedding SARS-CoV-2 vi rus would expect to have a negative (not detected) result in this assay. Performed At: [facility] [address] [place], [place] [id number] Rush Farmer MD [id number]    [place]  Final    Comment: Performed at [facility], [place] [address] [place], [place] [address]  SARS CORONAVIRUS 2 (TAT 6-24 HRS) Nasopharyngeal Nasopharyngeal Swab     Status: None   Collection Time: 01/04/19  3:06 PM   Specimen: Nasopharyngeal Swab  Result Value Ref Range Status   SARS Coronavirus 2 NEGATIVE NEGATIVE Final    Comment: (NOTE) SARS-CoV-2 target nucleic acids are NOT DETECTED. The SARS-CoV-2 RNA is generally detectable in upper and lower respiratory specimens during the acute phase of infection. Negative results do not preclude SARS-CoV-2 infection, do not rule out co-infections with other pathogens, and should not be used as the sole basis for treatment or other patient management decisions. Negative results must be combined with clinical observations, patient history, and epidemiological information. The expected result is Negative. Fact Sheet for Patients: SugarRoll.be Fact Sheet for Healthcare Providers: https://www.woods-mathews.com/ This test is not yet approved or cleared by the [place] FDA and  has been authorized for detection and/or diagnosis of SARS-CoV-2  by FDA under an Emergency Use Authorization (EUA). This EUA will remain  in effect (meaning this test can be used) for the duration of the COVID-19 declaration under Section 56 4(b)(1) of the Act, 21 U.S.C. section 360bbb-3(b)(1), unless the authorization is terminated or revoked sooner. Performed at [facility], [place] [address] [place], [place] [address]       Radiology Studies: DG Chest 2 View  Result Date: 01/04/2019 CLINICAL DATA:  Shortness of breath, history of recent cholecystectomy EXAM: CHEST - 2 VIEW COMPARISON:  11/02/2018 FINDINGS: Cardiac shadow is accentuated by the portable technique. Aortic calcifications are noted. Postsurgical changes are seen. Patchy infiltrates are identified bilaterally which are new from the prior exam. Some scarring in the left mid lung as well as  chronic left effusion is noted. Common bile duct stents are noted on the margin of the film. Postsurgical changes in the cervical spine are seen. IMPRESSION: Bilateral infiltrative opacities new from the prior exam consistent with multifocal pneumonia. Chronic changes in the left base. Electronically Signed   By: Inez Catalina M.D.   On: 01/04/2019 13:05   CT Angio Chest PE W/Cm &/Or Wo Cm  Result Date: 01/04/2019 CLINICAL DATA:  Shortness of breath. Recent surgery. Evaluate for pulmonary embolus. EXAM: CT ANGIOGRAPHY CHEST WITH CONTRAST TECHNIQUE: Multidetector CT imaging of the chest was performed using the standard protocol during bolus administration of intravenous contrast. Multiplanar CT image reconstructions and MIPs were obtained to evaluate the vascular anatomy. CONTRAST:  135mL OMNIPAQUE IOHEXOL 350 MG/ML SOLN COMPARISON:  CT chest 04/04/2010 FINDINGS: Cardiovascular: Previous median sternotomy and CABG procedure. Heart size normal. No pericardial effusion. Aortic atherosclerosis. The main pulmonary artery appears patent. There is no central obstructing pulmonary emboli identified bilaterally. No  lobar or segmental pulmonary artery filling defects identified. Mediastinum/Nodes: The trachea appears patent and is midline. Normal appearance of the thyroid gland. Normal appearance of the esophagus. No enlarged mediastinal or hilar adenopathy. Lungs/Pleura: Mild diffuse pleural thickening overlying the left lung is identified. Multifocal bilateral airspace and ground-glass densities are noted throughout both lungs. These findings correspond with the chest radiograph findings from earlier today and are favored to represent multifocal pneumonia. Upper Abdomen: Postoperative changes within the upper abdomen are identified. Biliary drain is noted. Pneumoperitoneum is identified within the upper abdomen, likely postoperative. Musculoskeletal: No chest wall abnormality. No acute or significant osseous findings. Review of the MIP images confirms the above findings. IMPRESSION: 1. No evidence for acute pulmonary embolus. 2. Extensive bilateral ground-glass and airspace opacities are noted throughout both lungs concerning for multi focal pneumonia. 3. Mild diffuse pleural thickening overlies the left lung with decrease in volume of left pleural effusion. Aortic Atherosclerosis (ICD10-I70.0). Electronically Signed   By: Kerby Moors M.D.   On: 01/04/2019 16:59   CARDIAC CATHETERIZATION  Result Date: 01/05/2019 Conclusions: 1. Severe native coronary artery disease, including 75% ostial LMCA stenosis, 50% mid LAD disease, sequential 90% and 70% proximal/mid LCx stenoses as well as 70% mid OM1 lesion, and sequential 70% ostial, 80% proximal, and 95% distal RCA stenoses. 2. Widely patent LIMA-LAD. 3. Chronically occluded SVG-OM1. 4. Subtotally occluded SVG-rPDA-rPL with 99% mid graft stenosis followed by extensive thrombus, some of which has embolized into the distal branches. 5. Upper normal left ventricular filling pressure. 6. Successful PCI to SVG-rPDA-rPL using overlapping Resolute Onyx 3.0 x 18 mm, 3.0 x 38 mm,  and 3.0 x 38 mm drug-eluting stents with 0% residual stenosis and TIMI-3 flow.  Small amount of nonocclusive thrombus remained in the rPL branch at the end of the procedure. Recommendations: 1. Indefinite dual antiplatelet therapy with aspirin and clopidogrel, as tolerated. 2. Medical therapy of protected LMCA and LCx disease.  If the patient has refractory symptoms, complex PCI of the ostial LMCA, LCx, and OM1 would need to be considered. 3. Aggressive secondary prevention. 4. Gentle post-catheterization hydration with close monitoring of renal function and hemoglobin, given contrast exposure and glycoprotein IIb/IIIa use during the intervention. Nelva Bush, MD [facility]   ECHOCARDIOGRAM COMPLETE  Result Date: 01/05/2019   ECHOCARDIOGRAM REPORT   Patient Name:   ROBERTANTHONY SULLENDER Date of Exam: 01/05/2019 Medical Rec #:  [id number]         Height:       69.0 in Accession #:  [id number]        Weight:       176.1 lb Date of Birth:  Aug 22, 1940         BSA:          1.96 m Patient Age:    12 years          BP:           120/72 mmHg Patient Gender: M                 HR:           91 bpm. Exam Location:  Inpatient Procedure: 2D Echo, Cardiac Doppler and Color Doppler Indications:    Dyspnea 786.09 / R06.00  History:        Patient has prior history of Echocardiogram examinations, most                 recent 12/23/2017. CAD, COPD; Risk Factors:Hypertension,                 Dyslipidemia, Sleep Apnea and GERD. Cancer.  Sonographer:    Jonelle Sidle Dance Referring Phys: [id number] [place]  1. Left ventricular ejection fraction, by visual estimation, is 40 to 45%. The left ventricle has mild to moderately decreased function. There is mildly increased left ventricular hypertrophy.  2. Severe hypokinesis of the left ventricular, entire inferior wall and inferoseptal wall.  3. Elevated left atrial and left ventricular end-diastolic pressures.  4. Left ventricular diastolic parameters are consistent  with Grade I diastolic dysfunction (impaired relaxation).  5. The left ventricle demonstrates regional wall motion abnormalities.  6. Global right ventricle has mildly reduced systolic function.The right ventricular size is normal. No increase in right ventricular wall thickness.  7. Left atrial size was moderately dilated.  8. Right atrial size was normal.  9. The mitral valve is abnormal. Mild mitral valve regurgitation. Posterior leaflet tethering is suspected. 10. The tricuspid valve is grossly normal. Tricuspid valve regurgitation is trivial. 11. The aortic valve is tricuspid. Aortic valve regurgitation is trivial. Mild aortic valve sclerosis without stenosis. 12. The pulmonic valve was grossly normal. Pulmonic valve regurgitation is not visualized. 13. Possible atrial shunt by color doppler. 14. Cannot exclude ASD/PFO. Consider transesophageal echocardiogram, if clinically indicated. 15. The inferior vena cava is normal in size with greater than 50% respiratory variability, suggesting right atrial pressure of 3 mmHg. 16. The left ventricular function has worsened. In comparison to the previous echocardiogram(s): The left ventricular function is worsened. LVEF 60-65% in 2019. FINDINGS  Left Ventricle: Left ventricular ejection fraction, by visual estimation, is 40 to 45%. The left ventricle has mild to moderately decreased function. Severe hypokinesis of the left ventricular, entire inferior wall and inferoseptal wall. The left ventricle demonstrates regional wall motion abnormalities. There is mildly increased left ventricular hypertrophy. Left ventricular diastolic parameters are consistent with Grade I diastolic dysfunction (impaired relaxation). Elevated left atrial and left ventricular end-diastolic pressures. Right Ventricle: The right ventricular size is normal. No increase in right ventricular wall thickness. Global RV systolic function is has mildly reduced systolic function. Left Atrium: Left atrial  size was moderately dilated. Right Atrium: Right atrial size was normal in size Pericardium: There is no evidence of pericardial effusion. Mitral Valve: The mitral valve is abnormal. Mild mitral valve regurgitation. Tricuspid Valve: The tricuspid valve is grossly normal. Tricuspid valve regurgitation is trivial. Aortic Valve: The aortic valve is tricuspid. Aortic valve regurgitation is trivial. Mild aortic valve sclerosis is present, with  no evidence of aortic valve stenosis. Pulmonic Valve: The pulmonic valve was grossly normal. Pulmonic valve regurgitation is not visualized. Pulmonic regurgitation is not visualized. Aorta: The aortic root and ascending aorta are structurally normal, with no evidence of dilitation. Venous: The inferior vena cava is normal in size with greater than 50% respiratory variability, suggesting right atrial pressure of 3 mmHg. IAS/Shunts: Possible atrial shunt by color doppler. Additional Comments: Flow jet at the atrial septum was seen, possible venous return; however, could not exclude ASD/PFO. If clinically indicated consider transesophageal echocardiogram.  LEFT VENTRICLE PLAX 2D LVIDd:         5.15 cm  Diastology LVIDs:         4.15 cm  LV e' lateral:   7.51 cm/s LV PW:         1.20 cm  LV E/e' lateral: 16.6 LV IVS:        1.03 cm  LV e' medial:    4.90 cm/s LVOT diam:     2.40 cm  LV E/e' medial:  25.5 LV SV:         50 ml LV SV Index:   25.33 LVOT Area:     4.52 cm  RIGHT VENTRICLE            IVC RV Basal diam:  2.66 cm    IVC diam: 1.18 cm RV S prime:     9.36 cm/s TAPSE (M-mode): 1.2 cm LEFT ATRIUM             Index       RIGHT ATRIUM           Index LA diam:        5.40 cm 2.76 cm/m  RA Area:     13.90 cm LA Vol (A2C):   58.5 ml 29.89 ml/m RA Volume:   32.80 ml  16.76 ml/m LA Vol (A4C):   84.6 ml 43.22 ml/m LA Biplane Vol: 73.8 ml 37.71 ml/m  AORTIC VALVE LVOT Vmax:   88.60 cm/s LVOT Vmean:  59.800 cm/s LVOT VTI:    0.190 m  AORTA Ao Root diam: 3.70 cm Ao Asc diam:  3.10  cm MITRAL VALVE MV Area (PHT): 4.80 cm              SHUNTS MV PHT:        45.82 msec            Systemic VTI:  0.19 m MV Decel Time: 158 msec              Systemic Diam: 2.40 cm MV E velocity: 125.00 cm/s 103 cm/s MV A velocity: 123.00 cm/s 70.3 cm/s MV E/A ratio:  1.02        1.5  Lyman Bishop MD Electronically signed by Lyman Bishop MD Signature Date/Time: 01/05/2019/10:24:39 AM    Final       Scheduled Meds:  amLODipine  2.5 mg Oral Daily   aspirin EC  81 mg Oral QHS   azithromycin  250 mg Oral Daily   clopidogrel  75 mg Oral Q breakfast   ezetimibe  10 mg Oral Daily   feeding supplement (ENSURE ENLIVE)  237 mL Oral BID BM   heparin  5,000 Units Subcutaneous Q8H   isosorbide mononitrate  60 mg Oral Daily   nebivolol  10 mg Oral Daily   pantoprazole  80 mg Oral Daily   rOPINIRole  2 mg Oral QHS   rosuvastatin  20 mg Oral Daily  sodium chloride flush  3 mL Intravenous Once   sodium chloride flush  3 mL Intravenous Q12H   sodium chloride flush  3 mL Intravenous Q12H   venlafaxine  75 mg Oral Daily   Continuous Infusions:  sodium chloride     sodium chloride     cefTRIAXone (ROCEPHIN)  IV 1 g (01/05/19 1930)     LOS: 2 days      Time spent: 25 minutes   Dessa Phi, DO [facility] 01/06/2019, 10:37 AM   Available via Epic secure chat 7am-7pm After these hours, please refer to coverage provider listed on [email or web address]

## 2019-01-06 NOTE — Telephone Encounter (Signed)
[name] (on HIPAA) called to inform that pt is hospitalised due to complications from gallbladder removal surgery.  She requested to reschedule CT scheduled 12.22.20.

## 2019-01-06 NOTE — TOC Initial Note (Signed)
Transition of Care [facility]) - Initial/Assessment Note    Patient Details  Name: [name] MRN: [id number] Date of Birth: 26-Mar-1940  Transition of [facility]) CM/SW Contact:    Bethena Roys, RN Phone Number: 01/06/2019, 11:39 AM  Clinical Narrative: Patient presented for shortness of breath. Post heart catheterization 01-05-19. Prior to arrival patient was from home alone with the support of daughter. Patient lives in a single family home and bedroom is downstairs. Patient states that daughter takes him to appointments and picks up medications from [employer] [facility]. Patient did not use any durable medical equipment prior to hospitalization. Physical and Occupational Therapies have been consulted for recommendations. Case Manager will continue to follow oxygen need for home and for additional transition of care needs                  Expected Discharge Plan: [place] Barriers to Discharge: Continued Medical Work up(IV antibiotics- following for home 02 needs.)   Expected Discharge Plan and Services Expected Discharge Plan: [place]   Discharge Planning Services: CM Consult Post Acute Care Choice: [place] arrangements for the past 2 months: Single Family Home                  Prior Living Arrangements/Services Living arrangements for the past 2 months: Single Family Home Lives with:: Self(daughter checks in hon him.) Patient language and need for interpreter reviewed:: Yes        Need for Family Participation in Patient Care: Yes (Comment) Care giver support system in place?: Yes (comment)   Criminal Activity/Legal Involvement Pertinent to Current Situation/Hospitalization: No - Comment as needed  Activities of Daily Living Home Assistive Devices/Equipment: Eyeglasses, Grab bars in shower ADL Screening (condition at time of admission) Patient's cognitive ability adequate to safely complete daily activities?:  Yes Is the patient deaf or have difficulty hearing?: No Does the patient have difficulty seeing, even when wearing glasses/contacts?: No Does the patient have difficulty concentrating, remembering, or making decisions?: No Patient able to express need for assistance with ADLs?: Yes Does the patient have difficulty dressing or bathing?: No Independently performs ADLs?: Yes (appropriate for developmental age) Does the patient have difficulty walking or climbing stairs?: No Weakness of Legs: None Weakness of Arms/Hands: None  Permission Sought/Granted Permission sought to share information with : Family Supports                Emotional Assessment Appearance:: Appears stated age Attitude/Demeanor/Rapport: Engaged Affect (typically observed): Appropriate Orientation: : Oriented to Situation, Oriented to  Time, Oriented to Place, Oriented to Self Alcohol / Substance Use: Not Applicable Psych Involvement: No (comment)  Admission diagnosis:  Acute respiratory failure (HCC) [J96.00] Elevated troponin [R77.8] Acute respiratory failure with hypoxia ([place]) [J96.01] Acute hypoxemic respiratory failure ([place]) [J96.01] Multifocal pneumonia [J18.9] Patient Active Problem List   Diagnosis Date Noted  . Non-ST elevation (NSTEMI) myocardial infarction ([place])   . Acute hypoxemic respiratory failure ([place]) 01/04/2019  . Multifocal pneumonia 01/04/2019  . Severe sepsis ([place]) 01/04/2019  . Elevated troponin 01/04/2019  . Acute on chronic diastolic (congestive) heart failure ([place]) 01/04/2019  . Acute respiratory failure ([place]) 01/04/2019  . elevated IgG4 11/01/2018  . Choledocholithiasis 10/27/2018  . Dilation of biliary tract 10/27/2018  . Esophageal dysphagia 10/27/2018  . Upper airway cough syndrome 07/28/2018  . Pleural effusion on left 07/28/2018  . History of pancreatitis 05/12/2018  . Abnormal LFTs 05/12/2018  . Abnormal findings on  esophagogastroduodenoscopy (EGD) 05/12/2018  . History  of ERCP 05/12/2018  . Anemia 05/12/2018  . Biliary stricture   . ESBL (extended spectrum beta-lactamase) producing bacteria infection 04/04/2018  . Ascending cholangitis 04/01/2018  . Diabetes mellitus type 2, noninsulin dependent ([place]) 03/31/2018  . Pancreatitis 03/31/2018  . Sepsis ([place]) 03/31/2018  . Bacteremia due to Gram-negative bacteria 03/31/2018  . Abdominal pain   . Dilated bile duct   . Acute biliary pancreatitis 03/30/2018  . Leukocytosis 03/30/2018  . CAD (coronary artery disease) 03/30/2018  . Osteoarthritis of left knee 10/18/2017  . Cervical radiculopathy 10/04/2017  . Pain of left hand 07/19/2017  . Pre-operative cardiovascular examination 06/16/2017  . CKD (chronic kidney disease), stage III 06/16/2017  . Carpal tunnel syndrome 05/27/2017  . 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 05/10/2017  . Open fracture of base of middle phalanx of finger 02/17/2017  . Laceration of index finger 02/17/2017  . Laceration of nail bed of finger 02/17/2017  . Laceration of thumb 02/17/2017  . Open fracture of distal phalanx of finger 02/17/2017  . Open fractures of multiple sites of phalanx of left hand 11/24/2016  . B12 deficiency 05/28/2015  . GERD (gastroesophageal reflux disease) 11/16/2014  . Hyperlipidemia LDL goal <70 11/21/2013  . DOE (dyspnea on exertion) 11/21/2013  . Other dysphagia 07/14/2013  . History of esophageal stricture 07/14/2013  . Hx of CABG 06/05/2013  . Leg pain, bilateral 10/18/2012  . Restless leg syndrome 06/21/2012  . ULNAR NEUROPATHY, LEFT 08/05/2009  . Urinary obstruction 10/19/2008  . ACTINIC KERATOSIS 06/25/2008  . DRY EYE SYNDROME 10/13/2007  . CARCINOMA, BLADDER, HX OF 07/02/2007  . BARRETTS ESOPHAGUS 05/23/2007  . Hypothyroidism 04/08/2007  . Other malaise and fatigue 04/08/2007  . Essential hypertension 10/12/2006  . ANEMIA, B12 DEFICIENCY 09/08/2006  . Depression 07/29/2006  . NEUROPATHY, IDIOPATHIC PERIPHERAL NEC 07/29/2006  .  Allergic rhinitis 07/29/2006  . LOW BACK PAIN 07/29/2006  . COLONIC POLYPS 11/04/2000   PCP:  Isaac Bliss, Rayford Halsted, MD Pharmacy:   [place], [place] - [place] [address] [place] [place] [address] Phone: [phone] Fax: [place], [facility] [address] [place] [address] [place] [address] Phone: [phone] Fax: [phone]     Social Determinants of Health ([place]) Interventions    Readmission Risk Interventions Readmission Risk Prevention Plan 01/06/2019  Transportation Screening Complete  HRI or [place] Complete  Social Work Consult for [place] Planning/Counseling Complete  Palliative Care Screening Not Applicable  Medication Review Press photographer) Complete  Some recent data might be hidden

## 2019-01-07 LAB — BASIC METABOLIC PANEL
Anion gap: 9 (ref 5–15)
BUN: 21 mg/dL (ref 8–23)
CO2: 29 mmol/L (ref 22–32)
Calcium: 8.7 mg/dL — ABNORMAL LOW (ref 8.9–10.3)
Chloride: 102 mmol/L (ref 98–111)
Creatinine, Ser: 1.26 mg/dL — ABNORMAL HIGH (ref 0.61–1.24)
GFR calc Af Amer: >60 mL/min (ref >60)
GFR calc non Af Amer: 55 mL/min — ABNORMAL LOW (ref >60)
Glucose, Bld: 206 mg/dL — ABNORMAL HIGH (ref 70–99)
Potassium: 3.6 mmol/L (ref 3.5–5.1)
Sodium: 140 mmol/L (ref 135–145)

## 2019-01-07 LAB — CBC
HCT: 30.2 % — ABNORMAL LOW (ref 39.0–52.0)
Hemoglobin: 9.6 g/dL — ABNORMAL LOW (ref 13.0–17.0)
MCH: 27.6 pg (ref 26.0–34.0)
MCHC: 31.8 g/dL (ref 30.0–36.0)
MCV: 86.8 fL (ref 80.0–100.0)
Platelets: 193 10*3/uL (ref 150–400)
RBC: 3.48 MIL/uL — ABNORMAL LOW (ref 4.22–5.81)
RDW: 14.2 % (ref 11.5–15.5)
WBC: 6.6 10*3/uL (ref 4.0–10.5)
nRBC: 0 % (ref 0.0–0.2)

## 2019-01-07 MED ORDER — PHENOL 1.4 % MT LIQD
1.0000 | OROMUCOSAL | Status: DC | PRN
  Administered 2019-01-07: 1 via OROMUCOSAL
  Filled 2019-01-07: qty 177

## 2019-01-07 NOTE — Evaluation (Signed)
Occupational Therapy Evaluation Patient Details Name: [name] MRN: [id number] DOB: 20-Feb-1940 Today's Date: 01/07/2019    History of Present Illness 78 year old male presents to ED with sudden onset of SOB. PMH includes: CAD, bladder Ca, HTN , COPD, recently here with acute billiary pancreatitis with dilated common bile duct ERCP with sphincterotomy and stent placement.   Clinical Impression   Pt PTA: living alone with family nearby. Pt was independent with ADL and mobility. Pt currently SOB, but O2 sats remain >90% on RA. Pt ambulating in room with no physical assist and no LOB episodes. Pt requiring no physical assist for UB/LB ADL sitting at bedside or standing at sink. No follow-up OT required at this time. OT signing off.    Follow Up Recommendations  No OT follow up    Equipment Recommendations  None recommended by OT    Recommendations for Other Services       Precautions / Restrictions Precautions Precautions: Fall Restrictions Weight Bearing Restrictions: No      Mobility Bed Mobility Overal bed mobility: Modified Independent                Transfers Overall transfer level: Modified independent Equipment used: None                  Balance Overall balance assessment: No apparent balance deficits (not formally assessed)                                         ADL either performed or assessed with clinical judgement   ADL Overall ADL's : At baseline                                       General ADL Comments: Pt requiring no physical assist for UB/LB ADL sitting at bedside or standing at sink. O2 remained >90% on RA throughout exertion.     Vision Baseline Vision/History: No visual deficits Patient Visual Report: No change from baseline Vision Assessment?: No apparent visual deficits     Perception     Praxis      Pertinent Vitals/Pain Pain Assessment: No/denies pain     Hand Dominance  Right   Extremity/Trunk Assessment Upper Extremity Assessment Upper Extremity Assessment: Overall WFL for tasks assessed   Lower Extremity Assessment Lower Extremity Assessment: Overall WFL for tasks assessed   Cervical / Trunk Assessment Cervical / Trunk Assessment: Normal   Communication Communication Communication: No difficulties   Cognition Arousal/Alertness: Awake/alert Behavior During Therapy: WFL for tasks assessed/performed Overall Cognitive Status: Within Functional Limits for tasks assessed                                     General Comments       Exercises     Shoulder Instructions      Home Living Family/patient expects to be discharged to:: Private residence Living Arrangements: Alone Available Help at Discharge: Family;Available PRN/intermittently Type of Home: House Home Access: Stairs to enter [employer] of Steps: 2 Entrance Stairs-Rails: None Home Layout: Multi-level;Able to live on main level with bedroom/bathroom     Bathroom Shower/Tub: Occupational psychologist: Standard     Home Equipment: Shower seat - built  in          Prior Functioning/Environment Level of Independence: Independent        Comments: Drives, independent at baseline however reports he has had SOB for years        OT Problem List: Decreased activity tolerance      OT Treatment/Interventions:      OT Goals(Current goals can be found in the care plan section) Acute Rehab OT Goals Patient Stated Goal: to return home OT Goal Formulation: With patient Time For Goal Achievement: 01/21/19 Potential to Achieve Goals: Good  OT Frequency:     Barriers to D/C:            Co-evaluation              AM-PAC OT "6 Clicks" Daily Activity     Outcome Measure Help from another person eating meals?: None Help from another person taking care of personal grooming?: None Help from another person toileting, which includes using  toliet, bedpan, or urinal?: None Help from another person bathing (including washing, rinsing, drying)?: None Help from another person to put on and taking off regular upper body clothing?: None Help from another person to put on and taking off regular lower body clothing?: None 6 Click Score: 24   End of Session Equipment Utilized During Treatment: Gait belt Nurse Communication: Mobility status  Activity Tolerance: Patient tolerated treatment well Patient left: in bed;with call bell/phone within reach  OT Visit Diagnosis: Muscle weakness (generalized) (M62.81);Unsteadiness on feet (R26.81)                Time: [id number] OT Time Calculation (min): 16 min Charges:  OT General Charges $OT Visit: 1 Visit OT Evaluation $OT Eval Low Complexity: 1 Low  Darryl Nestle) Marsa Aris OTR/L Acute Rehabilitation Services Pager: [phone] Office: [phone]   Jenene Slicker Kameryn Tisdel 01/07/2019, 1:28 PM

## 2019-01-07 NOTE — Plan of Care (Signed)
  Problem: Clinical Measurements: Goal: Respiratory complications will improve Outcome: Progressing Note: Oxygen requirements improving; decreased O2 to 2L/[place].  No s/s of respiratory complications noted. Goal: Cardiovascular complication will be avoided Outcome: Progressing Note: NSR on telemetry.  No s/s of cardiovascular complication noted.

## 2019-01-07 NOTE — Progress Notes (Addendum)
Progress Note  Patient Name: [name] Date of Encounter: 01/07/2019  Primary Cardiologist: [name], MD   Subjective   Pt on room air, but still feels mildly SOB. O2 sats good. Recovering from surgery and PNA, as well as PCI.  Inpatient Medications    Scheduled Meds: . amLODipine  2.5 mg Oral Daily  . aspirin EC  81 mg Oral QHS  . azithromycin  250 mg Oral Daily  . clopidogrel  75 mg Oral Q breakfast  . ezetimibe  10 mg Oral Daily  . feeding supplement (ENSURE ENLIVE)  237 mL Oral BID BM  . heparin  5,000 Units Subcutaneous Q8H  . isosorbide mononitrate  60 mg Oral Daily  . nebivolol  10 mg Oral Daily  . pantoprazole  80 mg Oral Daily  . rOPINIRole  2 mg Oral QHS  . rosuvastatin  20 mg Oral Daily  . sodium chloride flush  3 mL Intravenous Once  . sodium chloride flush  3 mL Intravenous Q12H  . sodium chloride flush  3 mL Intravenous Q12H  . venlafaxine  75 mg Oral Daily   Continuous Infusions: . sodium chloride    . sodium chloride    . cefTRIAXone (ROCEPHIN)  IV 1 g (01/06/19 1845)   PRN Meds: sodium chloride, sodium chloride, acetaminophen **OR** acetaminophen, alum & mag hydroxide-simeth, ondansetron **OR** ondansetron (ZOFRAN) IV, phenol, sodium chloride flush, sodium chloride flush, traMADol   Vital Signs    Vitals:   01/06/19 2216 01/07/19 0526 01/07/19 0530 01/07/19 0722  BP: 123/63 124/65    Pulse: 77 71    Resp: (!) 21     Temp: 99.2 F (37.3 C) 98.1 F (36.7 C)    TempSrc: Oral Oral    SpO2: 97% 97%  95%  Weight:   78.7 kg   Height:        Intake/Output Summary (Last 24 hours) at 01/07/2019 1034 Last data filed at 01/07/2019 0400 Gross per 24 hour  Intake 580 ml  Output 275 ml  Net 305 ml   Last 3 Weights 01/07/2019 01/06/2019 01/05/2019  Weight (lbs) 173 Jacob 8 oz 173 Jacob 8 oz 176 Jacob 1.6 oz  Weight (kg) 78.7 kg 78.7 kg 79.878 kg      Telemetry    Sinus rhythm in the 80s - Personally Reviewed  ECG    No new tracings -  Personally Reviewed  Physical Exam   GEN: No acute distress.   Neck: No JVD Cardiac: RRR, no murmurs, rubs, or gallops.  Respiratory: respirations unlabored, diminished throughout GI: Soft, nontender, non-distended  MS: No edema; No deformity. Neuro:  Nonfocal  Psych: Normal affect   Labs    High Sensitivity Troponin:   Recent Labs  Lab 01/04/19 1153 01/04/19 1327 01/04/19 1720 01/04/19 2115  TROPONINIHS 562* 2,259* 3,189* 4,847*      Chemistry Recent Labs  Lab 01/04/19 1327 01/05/19 0354 01/06/19 0323  NA 141 144 141  K 3.9 4.8 3.8  CL 103 101 104  CO2 27 29 25   GLUCOSE 123* 152* 127*  BUN 25* 27* 24*  CREATININE 1.51* 1.64* 1.30*  CALCIUM 9.1 9.0 8.3*  PROT 7.6  --   --   ALBUMIN 3.1*  --   --   AST 42*  --   --   ALT 25  --   --   ALKPHOS 66  --   --   BILITOT 0.8  --   --   GFRNONAA 44*  40* 53*  GFRAA 51* 46* >60  ANIONGAP 11 14 12      Hematology Recent Labs  Lab 01/05/19 0354 01/06/19 0323 01/07/19 1009  WBC 15.5* 8.1 6.6  RBC 3.98* 3.34* 3.48*  HGB 11.1* 9.2* 9.6*  HCT 34.8* 29.0* 30.2*  MCV 87.4 86.8 86.8  MCH 27.9 27.5 27.6  MCHC 31.9 31.7 31.8  RDW 14.5 14.3 14.2  PLT 201 174 193    BNP Recent Labs  Lab 01/04/19 1327  BNP 1,392.1*     DDimer No results for input(s): DDIMER in the last 168 hours.   Radiology    CARDIAC CATHETERIZATION  Result Date: 01/05/2019 Conclusions: 1. Severe native coronary artery disease, including 75% ostial LMCA stenosis, 50% mid LAD disease, sequential 90% and 70% proximal/mid LCx stenoses as well as 70% mid OM1 lesion, and sequential 70% ostial, 80% proximal, and 95% distal RCA stenoses. 2. Widely patent LIMA-LAD. 3. Chronically occluded SVG-OM1. 4. Subtotally occluded SVG-rPDA-rPL with 99% mid graft stenosis followed by extensive thrombus, some of which has embolized into the distal branches. 5. Upper normal left ventricular filling pressure. 6. Successful PCI to SVG-rPDA-rPL using overlapping  Resolute Onyx 3.0 x 18 mm, 3.0 x 38 mm, and 3.0 x 38 mm drug-eluting stents with 0% residual stenosis and TIMI-3 flow.  Small amount of nonocclusive thrombus remained in the rPL branch at the end of the procedure. Recommendations: 1. Indefinite dual antiplatelet therapy with aspirin and clopidogrel, as tolerated. 2. Medical therapy of protected LMCA and LCx disease.  If the patient has refractory symptoms, complex PCI of the ostial LMCA, LCx, and OM1 would need to be considered. 3. Aggressive secondary prevention. 4. Gentle post-catheterization hydration with close monitoring of renal function and hemoglobin, given contrast exposure and glycoprotein IIb/IIIa use during the intervention. Nelva Bush, MD [facility]    Cardiac Studies   Left heart cath 01/05/19: Conclusions: 1. Severe native coronary artery disease, including 75% ostial LMCA stenosis, 50% mid LAD disease, sequential 90% and 70% proximal/mid LCx stenoses as well as 70% mid OM1 lesion, and sequential 70% ostial, 80% proximal, and 95% distal RCA stenoses. 2. Widely patent LIMA-LAD. 3. Chronically occluded SVG-OM1. 4. Subtotally occluded SVG-rPDA-rPL with 99% mid graft stenosis followed by extensive thrombus, some of which has embolized into the distal branches. 5. Upper normal left ventricular filling pressure. 6. Successful PCI to SVG-rPDA-rPL using overlapping Resolute Onyx 3.0 x 18 mm, 3.0 x 38 mm, and 3.0 x 38 mm drug-eluting stents with 0% residual stenosis and TIMI-3 flow.  Small amount of nonocclusive thrombus remained in the rPL branch at the end of the procedure.  Recommendations: 1. Indefinite dual antiplatelet therapy with aspirin and clopidogrel, as tolerated. 2. Medical therapy of protected LMCA and LCx disease.  If the patient has refractory symptoms, complex PCI of the ostial LMCA, LCx, and OM1 would need to be considered. 3. Aggressive secondary prevention. 4. Gentle post-catheterization hydration with close  monitoring of renal function and hemoglobin, given contrast exposure and glycoprotein IIb/IIIa use during the intervention.   Echo 01/05/19:  1. Left ventricular ejection fraction, by visual estimation, is 40 to 45%. The left ventricle has mild to moderately decreased function. There is mildly increased left ventricular hypertrophy.  2. Severe hypokinesis of the left ventricular, entire inferior wall and inferoseptal wall.  3. Elevated left atrial and left ventricular end-diastolic pressures.  4. Left ventricular diastolic parameters are consistent with Grade I diastolic dysfunction (impaired relaxation).  5. The left ventricle demonstrates regional wall motion  abnormalities.  6. Global right ventricle has mildly reduced systolic function.The right ventricular size is normal. No increase in right ventricular wall thickness.  7. Left atrial size was moderately dilated.  8. Right atrial size was normal.  9. The mitral valve is abnormal. Mild mitral valve regurgitation. Posterior leaflet tethering is suspected. 10. The tricuspid valve is grossly normal. Tricuspid valve regurgitation is trivial. 11. The aortic valve is tricuspid. Aortic valve regurgitation is trivial. Mild aortic valve sclerosis without stenosis. 12. The pulmonic valve was grossly normal. Pulmonic valve regurgitation is not visualized. 13. Possible atrial shunt by color doppler. 14. Cannot exclude ASD/PFO. Consider transesophageal echocardiogram, if clinically indicated. 15. The inferior vena cava is normal in size with greater than 50% respiratory variability, suggesting right atrial pressure of 3 mmHg. 16. The left ventricular function has worsened.  Patient Profile     78 y.o. male with CAD post CABG 2001 LIMA to LAD vein graft to marginal branch vein graft to right coronary artery with recent cholecystectomy laparoscopic on Monday here with bilateral pneumonia shortness of breath and non-ST elevation myocardial infarction  possibly type II MI with rising troponin.  Also has underlying COPD obstructive sleep apnea  Assessment & Plan    1. NSTEMI, s/p CABG (2001) He has significant native CAD including 75% ostial left main, 50% mid LAD, sequential disease in the LCx o f90% then 70% as well as 70% OM1 and sequential 70% ostial, 80% proximal, and 95% distal RCA - widely patent LIMA-LAD - subtotally occluded SVG-rPDA-rPL with 99% mid graft occlusion treated with overlapping DES x 3 - patient will be on indefinite ASA + plavix - plan for aggressive secondary prevention - if refractory symptoms, may need to consider complex PCI of ostial left main, LCx and OM1 - Hb dropped to 9.2 from 11.1 - now stable at 9.6 - recommend discharging on nu-iron supplement - recheck CBC in 1 week- cards or PCP - continue ASA, plavix, BB, statin, zetia, imdur, and amlodipine   2. CKD stage III - post-cath hydration - sCr 1.30 (1.64)  - he is at or below baseline   3. Hyperlipidemia - 03/31/2018: Cholesterol 80; HDL 32; LDL Cholesterol 34; Triglycerides 72; VLDL 14 - continue statin and zetia   4. Hypertension - pressure well-controlled - continue present medications without changes   5. PNA - per primary - now on room air, ambulated in hall without oxygen   For questions or updates, please contact [place] Please consult [email or web address] for contact info under      Signed, Ledora Bottcher, PA  01/07/2019, 10:34 AM    Patient seen and examined. Agree with assessment and plan.  I have been taking care of him for 20 years and he underwent CABG surgery in 2001 with his LIMA to his LAD, vein to the OM and vein to the RCA.  He suffered a non-STEMI following laparoscopic cholecystectomy. I reviewed his most recent angiographic findings where he was found to have high-grade thrombotic subtotal stenoses involving the SVG to distal RCA which was successfully intervened upon.  He has an old occluded graft to the OM  vessel.  We will initially try an increase medical regimen for his left main and circumflex territory but if he has continued issues with anginal symptomatology which is not classic chest pressure for him consideration for complex intervention may be necessary in the future.  At present he is still having issues with diarrhea following his laparoscopic cholecystectomy for acute cholecystitis.  Creatinine has slightly improved today from 1.64-1.30.  There is no JVD.  Lungs were clear.  Rhythm is regular with 1/6 systolic murmur.  Right upper quadrant tenderness has significantly improved.  Continue nitrates, beta-blocker, and amlodipine for concomitant CAD.  Will follow.   Troy Sine, MD, [facility] 01/07/2019 11:49 AM

## 2019-01-07 NOTE — Progress Notes (Addendum)
PROGRESS NOTE    [name]  [id number] DOB: Feb 07, 1940 DOA: 01/04/2019 PCP: Isaac Bliss, Rayford Halsted, MD     Brief Narrative:  [name] is a 78 y.o. male with medical history significant of coronary artery disease, COPD, hypertension, hyperlipidemia with recent cholecystectomy who presents with sudden onset of shortness of breath.  He had an uncomplicated surgical procedure 2 days ago, woke up this morning feeling short of breath and could not walk a short distance in his house.  He admits to feeling hot and cold but did not take a temperature at home, admits to a productive cough, shortness of breath but no chest pain, no nausea and vomiting, improved abdominal pain postsurgically, and continued diarrhea. Labs obtained which revealed creatinine stable at 1.5, BNP 1392, troponin  162 which subsequently elevated to 2259, WBC 22.8.  Covid antigen testing was negative, PCR pending.  CTA chest was negative for PE but did reveal multifocal pneumonia.  Cardiology also consulted for elevated troponin.  Ultimately patient underwent heart catheterization and required PCI.  New events last 24 hours / Subjective: Breathing has improved. Still having diarrhea.   Assessment & Plan:   Principal Problem:   Acute hypoxemic respiratory failure (HCC) Active Problems:   Depression   Essential hypertension   Restless leg syndrome   Hx of CABG   Hyperlipidemia LDL goal <70   GERD (gastroesophageal reflux disease)   CKD (chronic kidney disease), stage III   CAD (coronary artery disease)   Multifocal pneumonia   Severe sepsis (HCC)   Elevated troponin   Acute on chronic diastolic (congestive) heart failure (HCC)   Acute respiratory failure (HCC)   Non-ST elevation (NSTEMI) myocardial infarction ([place])   Acute hypoxemic respiratory failure -CTA chest negative for PE, but did show extensive bilateral ground-glass and airspace opacities are noted throughout both lungs concerning for  multi focal pneumonia. -Required NRB at presentation to ED, now weaned to 1L [place] O2 -Continue to wean oxygen as able to room air  Severe sepsis secondary to multifocal pneumonia, sepsis POA, with elevated troponin and respiratory failure  -Covid negative  -Rocephin/Azithromycin  -Leukocytosis resolved, afebrile overnight  NSTEMI CAD s/p CABG  -Appreciate cardiology consult  -Status post heart cath with PCI -Continue aspirin, Plavix, imdur, crestor   Acute on chronic systolic and diastolic HF -Appreciate cardiology consult  -Echocardiogram shows EF 40 to 45%, severe hypokinesis left ventricular, inferior wall, inferior septal wall  HTN -Amlodipine, Bystolic  HLD -Crestor, Zetia  CKD stage 3b  -Baseline Cr ~1.6-1.7 -Stable   S/p cholecystectomy  -Stable   Depression -Continue Effexor  GERD, hx Barrett's esophagus  -PPI   DVT prophylaxis: Subq hep  Code Status: Full Family Communication: No family at bedside; updated daughter over the phone this afternoon Disposition Plan: Pending continued medical stabilization, clinical improvement. Home with home health when cleared for discharge by cardiology    Consultants:   Cardiology  Procedures:   Heart cath 12/17    Antimicrobials:  Anti-infectives (From admission, onward)   Start     Dose/Rate Route Frequency Ordered Stop   01/06/19 1000  azithromycin (ZITHROMAX) tablet 250 mg     250 mg Oral Daily 01/06/19 0954     01/05/19 1830  azithromycin (ZITHROMAX) 250 mg in dextrose 5 % 125 mL IVPB  Status:  Discontinued     250 mg 125 mL/hr over 60 Minutes Intravenous Every 24 hours 01/04/19 1819 01/06/19 0954   01/05/19 1730  cefTRIAXone (ROCEPHIN) 1 g  in sodium chloride 0.9 % 100 mL IVPB     1 g 200 mL/hr over 30 Minutes Intravenous Every 24 hours 01/04/19 1819     01/04/19 1830  azithromycin (ZITHROMAX) 500 mg in sodium chloride 0.9 % 250 mL IVPB     500 mg 250 mL/hr over 60 Minutes Intravenous  Once  01/04/19 1825 01/04/19 1936   01/04/19 1715  cefTRIAXone (ROCEPHIN) 1 g in sodium chloride 0.9 % 100 mL IVPB     1 g 200 mL/hr over 30 Minutes Intravenous  Once 01/04/19 1711 01/04/19 1825   01/04/19 1715  azithromycin (ZITHROMAX) 500 mg in sodium chloride 0.9 % 250 mL IVPB  Status:  Discontinued     500 mg 250 mL/hr over 60 Minutes Intravenous  Once 01/04/19 1711 01/04/19 1825       Objective: Vitals:   01/07/19 0526 01/07/19 0530 01/07/19 0722 01/07/19 1000  BP: 124/65     Pulse: 71     Resp:      Temp: 98.1 F (36.7 C)     TempSrc: Oral     SpO2: 97%  95% 96%  Weight:  78.7 kg    Height:        Intake/Output Summary (Last 24 hours) at 01/07/2019 1156 Last data filed at 01/07/2019 0400 Gross per 24 hour  Intake 580 ml  Output 275 ml  Net 305 ml   Filed Weights   01/05/19 0000 01/06/19 0400 01/07/19 0530  Weight: 79.9 kg 78.7 kg 78.7 kg    Examination: General exam: Appears calm and comfortable  Respiratory system: Clear to auscultation. Respiratory effort normal. On 1L [place] O2  Cardiovascular system: S1 & S2 heard, RRR. No pedal edema. Gastrointestinal system: Abdomen is nondistended, soft and nontender. Normal bowel sounds heard. Central nervous system: Alert and oriented. Non focal exam. Speech clear  Extremities: Symmetric in appearance bilaterally  Skin: No rashes, lesions or ulcers on exposed skin  Psychiatry: Judgement and insight appear stable. Mood & affect appropriate.    Data Reviewed: I have personally reviewed following labs and imaging studies  CBC: Recent Labs  Lab 01/04/19 1153 01/05/19 0354 01/06/19 0323 01/07/19 1009  WBC 22.8* 15.5* 8.1 6.6  HGB 12.2* 11.1* 9.2* 9.6*  HCT 40.5 34.8* 29.0* 30.2*  MCV 93.1 87.4 86.8 86.8  PLT 174 201 174 0000000   Basic Metabolic Panel: Recent Labs  Lab 01/04/19 1327 01/05/19 0354 01/06/19 0323 01/07/19 1009  NA 141 144 141 140  K 3.9 4.8 3.8 3.6  CL 103 101 104 102  CO2 27 29 25 29   GLUCOSE 123*  152* 127* 206*  BUN 25* 27* 24* 21  CREATININE 1.51* 1.64* 1.30* 1.26*  CALCIUM 9.1 9.0 8.3* 8.7*   GFR: Estimated Creatinine Clearance: 49.9 mL/min (A) (by C-G formula based on SCr of 1.26 mg/dL (H)). Liver Function Tests: Recent Labs  Lab 01/04/19 1327  AST 42*  ALT 25  ALKPHOS 66  BILITOT 0.8  PROT 7.6  ALBUMIN 3.1*   No results for input(s): LIPASE, AMYLASE in the last 168 hours. No results for input(s): AMMONIA in the last 168 hours. Coagulation Profile: No results for input(s): INR, PROTIME in the last 168 hours. Cardiac Enzymes: No results for input(s): CKTOTAL, CKMB, CKMBINDEX, TROPONINI in the last 168 hours. BNP (last 3 results) Recent Labs    07/27/18 1618  PROBNP 150.0*   HbA1C: No results for input(s): HGBA1C in the last 72 hours. CBG: No results for input(s): GLUCAP in the  last 168 hours. Lipid Profile: No results for input(s): CHOL, HDL, LDLCALC, TRIG, CHOLHDL, LDLDIRECT in the last 72 hours. Thyroid Function Tests: No results for input(s): TSH, T4TOTAL, FREET4, T3FREE, THYROIDAB in the last 72 hours. Anemia Panel: No results for input(s): VITAMINB12, FOLATE, FERRITIN, TIBC, IRON, RETICCTPCT in the last 72 hours. Sepsis Labs: No results for input(s): PROCALCITON, LATICACIDVEN in the last 168 hours.  Recent Results (from the past 240 hour(s))  Novel Coronavirus, NAA (Hosp order, Send-out to Ref Lab; TAT 18-24 hrs     Status: None   Collection Time: 12/29/18  4:17 PM   Specimen: Nasopharyngeal Swab; Respiratory  Result Value Ref Range Status   SARS-CoV-2, NAA NOT DETECTED NOT DETECTED Final    Comment: (NOTE) This nucleic acid amplification test was developed and its performance characteristics determined by [employer]. Nucleic acid amplification tests include PCR and TMA. This test has not been FDA cleared or approved. This test has been authorized by FDA under an Emergency Use Authorization (EUA). This test is only authorized for the  duration of time the declaration that circumstances exist justifying the authorization of the emergency use of in vitro diagnostic tests for detection of SARS-CoV-2 virus and/or diagnosis of COVID-19 infection under section 564(b)(1) of the Act, 21 U.S.C. [id number]) (1), unless the authorization is terminated or revoked sooner. When diagnostic testing is negative, the possibility of a false negative result should be considered in the context of a patient's recent exposures and the presence of clinical signs and symptoms consistent with COVID-19. An individual without symptoms of COVID- 19 and who is not shedding SARS-CoV-2 vi rus would expect to have a negative (not detected) result in this assay. Performed At: [facility] [address] [place], [place] [id number] Rush Farmer MD [id number]    [place]  Final    Comment: Performed at [facility], [place] [address] [place], [place] [address]  SARS CORONAVIRUS 2 (TAT 6-24 HRS) Nasopharyngeal Nasopharyngeal Swab     Status: None   Collection Time: 01/04/19  3:06 PM   Specimen: Nasopharyngeal Swab  Result Value Ref Range Status   SARS Coronavirus 2 NEGATIVE NEGATIVE Final    Comment: (NOTE) SARS-CoV-2 target nucleic acids are NOT DETECTED. The SARS-CoV-2 RNA is generally detectable in upper and lower respiratory specimens during the acute phase of infection. Negative results do not preclude SARS-CoV-2 infection, do not rule out co-infections with other pathogens, and should not be used as the sole basis for treatment or other patient management decisions. Negative results must be combined with clinical observations, patient history, and epidemiological information. The expected result is Negative. Fact Sheet for Patients: SugarRoll.be Fact Sheet for Healthcare Providers: https://www.woods-mathews.com/ This test is not yet approved or cleared by the  [place] FDA and  has been authorized for detection and/or diagnosis of SARS-CoV-2 by FDA under an Emergency Use Authorization (EUA). This EUA will remain  in effect (meaning this test can be used) for the duration of the COVID-19 declaration under Section 56 4(b)(1) of the Act, 21 U.S.C. section 360bbb-3(b)(1), unless the authorization is terminated or revoked sooner. Performed at [facility], [place] [address] [place], [place] [address]       Radiology Studies: CARDIAC CATHETERIZATION  Result Date: 01/05/2019 Conclusions: 1. Severe native coronary artery disease, including 75% ostial LMCA stenosis, 50% mid LAD disease, sequential 90% and 70% proximal/mid LCx stenoses as well as 70% mid OM1 lesion, and sequential 70% ostial, 80% proximal, and 95%  distal RCA stenoses. 2. Widely patent LIMA-LAD. 3. Chronically occluded SVG-OM1. 4. Subtotally occluded SVG-rPDA-rPL with 99% mid graft stenosis followed by extensive thrombus, some of which has embolized into the distal branches. 5. Upper normal left ventricular filling pressure. 6. Successful PCI to SVG-rPDA-rPL using overlapping Resolute Onyx 3.0 x 18 mm, 3.0 x 38 mm, and 3.0 x 38 mm drug-eluting stents with 0% residual stenosis and TIMI-3 flow.  Small amount of nonocclusive thrombus remained in the rPL branch at the end of the procedure. Recommendations: 1. Indefinite dual antiplatelet therapy with aspirin and clopidogrel, as tolerated. 2. Medical therapy of protected LMCA and LCx disease.  If the patient has refractory symptoms, complex PCI of the ostial LMCA, LCx, and OM1 would need to be considered. 3. Aggressive secondary prevention. 4. Gentle post-catheterization hydration with close monitoring of renal function and hemoglobin, given contrast exposure and glycoprotein IIb/IIIa use during the intervention. Nelva Bush, MD [facility]      Scheduled Meds: . amLODipine  2.5 mg Oral Daily  . aspirin EC  81 mg Oral QHS  .  azithromycin  250 mg Oral Daily  . clopidogrel  75 mg Oral Q breakfast  . ezetimibe  10 mg Oral Daily  . feeding supplement (ENSURE ENLIVE)  237 mL Oral BID BM  . heparin  5,000 Units Subcutaneous Q8H  . isosorbide mononitrate  60 mg Oral Daily  . nebivolol  10 mg Oral Daily  . pantoprazole  80 mg Oral Daily  . rOPINIRole  2 mg Oral QHS  . rosuvastatin  20 mg Oral Daily  . sodium chloride flush  3 mL Intravenous Once  . sodium chloride flush  3 mL Intravenous Q12H  . sodium chloride flush  3 mL Intravenous Q12H  . venlafaxine  75 mg Oral Daily   Continuous Infusions: . sodium chloride    . sodium chloride    . cefTRIAXone (ROCEPHIN)  IV 1 g (01/06/19 1845)     LOS: 3 days      Time spent: 25 minutes   Dessa Phi, DO [facility] 01/07/2019, 11:56 AM   Available via Epic secure chat 7am-7pm After these hours, please refer to coverage provider listed on [email or web address]

## 2019-01-07 NOTE — Progress Notes (Signed)
CARDIAC REHAB PHASE I   PRE:  Rate/Rhythm: 80 SR                             Bathroom    SaO2: 92%RA  MODE:  Ambulation: 250 ft   POST:  Rate/Rhythm: 92 SR  BP:  Supine:   Sitting: 128/75  Standing:    SaO2: 89-92%RA [id number] Noticed pt off telemetry. Went to room and pt coming out of bathroom and washing hands at sink. Took pt for a walk and monitored sats after connecting back to telemetry. Pt stated did not need assistance or walker. He was able to walk independently. Sats dropped a few times but quickly rebounded with pursed lip breathing. Sats much better than when we walked yesterday morning. Pt stated he feels that his breathing is almost back to what is normal for him. He stated he has been seeing pulmonologist. Jama Flavors 250 ft, resting at 125 ft and taking deep breaths as needed. Back to room and sitting on side of bed. No CP. Reviewed walking for ex with pt listening to his body and increasing distance as tolerated. Left off oxygen and sats at 90-91%RA.   Graylon Good, RN BSN  01/07/2019 8:40 AM

## 2019-01-08 ENCOUNTER — Other Ambulatory Visit: Payer: Self-pay | Admitting: Internal Medicine

## 2019-01-08 DIAGNOSIS — Z951 Presence of aortocoronary bypass graft: Secondary | ICD-10-CM

## 2019-01-08 DIAGNOSIS — I10 Essential (primary) hypertension: Secondary | ICD-10-CM

## 2019-01-08 DIAGNOSIS — E785 Hyperlipidemia, unspecified: Secondary | ICD-10-CM

## 2019-01-08 DIAGNOSIS — R778 Other specified abnormalities of plasma proteins: Secondary | ICD-10-CM

## 2019-01-08 LAB — CBC
HCT: 28.4 % — ABNORMAL LOW (ref 39.0–52.0)
Hemoglobin: 9.2 g/dL — ABNORMAL LOW (ref 13.0–17.0)
MCH: 27.8 pg (ref 26.0–34.0)
MCHC: 32.4 g/dL (ref 30.0–36.0)
MCV: 85.8 fL (ref 80.0–100.0)
Platelets: 198 10*3/uL (ref 150–400)
RBC: 3.31 MIL/uL — ABNORMAL LOW (ref 4.22–5.81)
RDW: 14 % (ref 11.5–15.5)
WBC: 6.8 10*3/uL (ref 4.0–10.5)
nRBC: 0 % (ref 0.0–0.2)

## 2019-01-08 LAB — BASIC METABOLIC PANEL
Anion gap: 11 (ref 5–15)
BUN: 19 mg/dL (ref 8–23)
CO2: 28 mmol/L (ref 22–32)
Calcium: 8.4 mg/dL — ABNORMAL LOW (ref 8.9–10.3)
Chloride: 104 mmol/L (ref 98–111)
Creatinine, Ser: 1.44 mg/dL — ABNORMAL HIGH (ref 0.61–1.24)
GFR calc Af Amer: 54 mL/min — ABNORMAL LOW (ref >60)
GFR calc non Af Amer: 47 mL/min — ABNORMAL LOW (ref >60)
Glucose, Bld: 132 mg/dL — ABNORMAL HIGH (ref 70–99)
Potassium: 3.8 mmol/L (ref 3.5–5.1)
Sodium: 143 mmol/L (ref 135–145)

## 2019-01-08 MED ORDER — CEFDINIR 300 MG PO CAPS: 300.0000 mg | ORAL_CAPSULE | Freq: Two times a day (BID) | ORAL | 0 refills | Status: AC

## 2019-01-08 MED ORDER — AMLODIPINE BESYLATE 2.5 MG PO TABS: 5.0000 mg | ORAL_TABLET | Freq: Every day | ORAL | 2 refills | Status: DC

## 2019-01-08 MED ORDER — AZITHROMYCIN 250 MG PO TABS: 250.0000 mg | ORAL_TABLET | Freq: Every day | ORAL | 0 refills | Status: AC

## 2019-01-08 MED ORDER — CLOPIDOGREL BISULFATE 75 MG PO TABS: 75.0000 mg | ORAL_TABLET | Freq: Every day | ORAL | 2 refills | Status: AC

## 2019-01-08 NOTE — TOC Transition Note (Signed)
Transition of Care [facility]) - CM/SW Discharge Note   Patient Details  Name: [name] MRN: [id number] Date of Birth: 09-Dec-1940  Transition of Care [facility]) CM/SW Contact:  Claudie Leach, RN 01/08/2019, 11:16 AM   Clinical Narrative:    Patient to d/c home with daughter and [facility] therapy.  Discussed Medicare rated list with patient and that ultimately it would come down to what agency can take insurance.  Patient referral accepted by [facility] with Alvis Lemmings.  Information given to patient on AVS.  No DME needed.   Patient would like further questions to be directed to daughter.    Final next level of care: [place] Barriers to Discharge: No Barriers Identified   Patient Goals and CMS Choice Patient states their goals for this hospitalization and ongoing recovery are:: "to return home" CMS Medicare.gov Compare Post Acute Care list provided to:: Patient Choice offered to / list presented to : Patient   Discharge Plan and Services   Discharge Planning Services: CM Consult Post Acute Care Choice: Home Health                    HH Arranged: PT, OT [facility] Agency: [place] Date [place]: 01/08/19 Time HH Agency Contacted: 34 Representative spoke with at [place]: [place]   Readmission Risk Interventions Readmission Risk Prevention Plan 01/06/2019  Transportation Screening Complete  [place] or [place] Complete  Social Work Consult for [place] Planning/Counseling Complete  Palliative Care Screening Not Applicable  Medication Review [employer] photographer) Complete  Some recent data might be hidden

## 2019-01-08 NOTE — Progress Notes (Signed)
Progress Note  Patient Name: [name] Date of Encounter: 01/08/2019  Primary Cardiologist: [name], MD  Subjective   No chest pain; he had some abdominal discomfort last evening which seem to improve with sitting up, now pain-free and feeling well  Inpatient Medications    Scheduled Meds: . amLODipine  2.5 mg Oral Daily  . aspirin EC  81 mg Oral QHS  . azithromycin  250 mg Oral Daily  . clopidogrel  75 mg Oral Q breakfast  . ezetimibe  10 mg Oral Daily  . feeding supplement (ENSURE ENLIVE)  237 mL Oral BID BM  . heparin  5,000 Units Subcutaneous Q8H  . isosorbide mononitrate  60 mg Oral Daily  . nebivolol  10 mg Oral Daily  . pantoprazole  80 mg Oral Daily  . rOPINIRole  2 mg Oral QHS  . rosuvastatin  20 mg Oral Daily  . sodium chloride flush  3 mL Intravenous Once  . sodium chloride flush  3 mL Intravenous Q12H  . sodium chloride flush  3 mL Intravenous Q12H  . venlafaxine  75 mg Oral Daily   Continuous Infusions: . sodium chloride    . sodium chloride    . cefTRIAXone (ROCEPHIN)  IV 1 g (01/07/19 1725)   PRN Meds: sodium chloride, sodium chloride, acetaminophen **OR** acetaminophen, alum & mag hydroxide-simeth, ondansetron **OR** ondansetron (ZOFRAN) IV, phenol, sodium chloride flush, sodium chloride flush, traMADol   Vital Signs    Vitals:   01/07/19 1000 01/07/19 1400 01/07/19 2127 01/08/19 0555  BP:  123/63 118/62 129/63  Pulse:   79 76  Resp:   (!) 23 (!) 23  Temp:  98.1 F (36.7 C)  98.2 F (36.8 C)  TempSrc:  Oral  Oral  SpO2: 96% 94% 94% 97%  Weight:    78.8 kg  Height:        Intake/Output Summary (Last 24 hours) at 01/08/2019 0832 Last data filed at 01/08/2019 0556 Gross per 24 hour  Intake 243 ml  Output 325 ml  Net -82 ml    I/O since admission: +1118  [facility] Weights   01/06/19 0400 01/07/19 0530 01/08/19 0555  Weight: 78.7 kg 78.7 kg 78.8 kg    Telemetry    Sinus 65- Personally Reviewed  ECG    January 06, 2019 ECG (independently read by me): Normal sinus rhythm at 86, inferior Q waves, RV conduction delay  Physical Exam   BP 129/63 (BP Location: Left Arm)   Pulse 76   Temp 98.2 F (36.8 C) (Oral)   Resp (!) 23   Ht 5\' 9"  (1.753 m)   Wt 78.8 kg   SpO2 97%   BMI 25.65 kg/m  General: Alert, oriented, no distress.  Skin: normal turgor, no rashes, warm and dry HEENT: Normocephalic, atraumatic. Pupils equal round and reactive to light; sclera anicteric; extraocular muscles intact;  Nose without nasal septal hypertrophy Mouth/Parynx benign;  Neck: No JVD, no carotid bruits; normal carotid upstroke Lungs: clear to ausculatation and percussion; no wheezing or rales Chest wall: without tenderness to palpitation Heart: PMI not displaced, RRR, s1 s2 normal, 1/6 systolic murmur, no diastolic murmur, no rubs, gallops, thrills, or heaves Abdomen: soft, nontender; no hepatosplenomehaly, BS+; abdominal aorta nontender and not dilated by palpation. Back: no CVA tenderness Pulses 2+ Musculoskeletal: full range of motion, normal strength, no joint deformities Extremities: no clubbing cyanosis or edema, Homan's sign negative  Neurologic: grossly nonfocal; Cranial nerves grossly wnl Psychologic: Normal mood and  affect   Labs    Chemistry Recent Labs  Lab 01/04/19 1327 01/06/19 0323 01/07/19 1009 01/08/19 0336  NA 141 141 140 143  K 3.9 3.8 3.6 3.8  CL 103 104 102 104  CO2 27 25 29 28   GLUCOSE 123* 127* 206* 132*  BUN 25* 24* 21 19  CREATININE 1.51* 1.30* 1.26* 1.44*  CALCIUM 9.1 8.3* 8.7* 8.4*  PROT 7.6  --   --   --   ALBUMIN 3.1*  --   --   --   AST 42*  --   --   --   ALT 25  --   --   --   ALKPHOS 66  --   --   --   BILITOT 0.8  --   --   --   GFRNONAA 44* 53* 55* 47*  GFRAA 51* >60 >60 54*  ANIONGAP 11 12 9 11      Hematology Recent Labs  Lab 01/06/19 0323 01/07/19 1009 01/08/19 0336  WBC 8.1 6.6 6.8  RBC 3.34* 3.48* 3.31*  HGB 9.2* 9.6* 9.2*  HCT 29.0* 30.2* 28.4*    MCV 86.8 86.8 85.8  MCH 27.5 27.6 27.8  MCHC 31.7 31.8 32.4  RDW 14.3 14.2 14.0  PLT 174 193 198      Lab 01/04/19 1153 01/04/19 1327 01/04/19 1720 01/04/19 2115  TROPONINIHS 562* 2,259* 3,189* 4,847*         Cardiac EnzymesNo results for input(s): TROPONINI in the last 168 hours. No results for input(s): TROPIPOC in the last 168 hours.   BNP Recent Labs  Lab 01/04/19 1327  BNP 1,392.1*     DDimer No results for input(s): DDIMER in the last 168 hours.   Lipid Panel     Component Value Date/Time   CHOL 80 03/31/2018 0508   CHOL 99 (L) 12/24/2017 0819   CHOL 116 03/30/2013 1053   TRIG 72 03/31/2018 0508   TRIG 80 03/30/2013 1053   HDL 32 (L) 03/31/2018 0508   HDL 28 (L) 12/24/2017 0819   HDL 34 (L) 03/30/2013 1053   CHOLHDL 2.5 03/31/2018 0508   VLDL 14 03/31/2018 0508   LDLCALC 34 03/31/2018 0508   LDLCALC 32 12/24/2017 0819   LDLCALC 66 03/30/2013 1053   LDLDIRECT 90.3 04/17/2008 0911     Radiology    No results found.  Cardiac Studies   Left heart cath 01/05/19: Conclusions: 1. Severe native coronary artery disease, including 75% ostial LMCA stenosis, 50% mid LAD disease, sequential 90% and 70% proximal/mid LCx stenoses as well as 70% mid OM1 lesion, and sequential 70% ostial, 80% proximal, and 95% distal RCA stenoses. 2. Widely patent LIMA-LAD. 3. Chronically occluded SVG-OM1. 4. Subtotally occluded SVG-rPDA-rPL with 99% mid graft stenosis followed by extensive thrombus, some of which has embolized into the distal branches. 5. Upper normal left ventricular filling pressure. 6. Successful PCI to SVG-rPDA-rPL using overlapping Resolute Onyx 3.0 x 18 mm, 3.0 x 38 mm, and 3.0 x 38 mm drug-eluting stents with 0% residual stenosis and TIMI-3 flow. Small amount of nonocclusive thrombus remained in the rPL branch at the end of the procedure.  Recommendations: 1. Indefinite dual antiplatelet therapy with aspirin and clopidogrel, as tolerated. 2. Medical  therapy of protected LMCA and LCx disease. If the patient has refractory symptoms, complex PCI of the ostial LMCA, LCx, and OM1 would need to be considered. 3. Aggressive secondary prevention. 4. Gentle post-catheterization hydration with close monitoring of renal function and hemoglobin, given  contrast exposure and glycoprotein IIb/IIIa use during the intervention.        Echo 01/05/19: 1. Left ventricular ejection fraction, by visual estimation, is 40 to 45%. The left ventricle has mild to moderately decreased function. There is mildly increased left ventricular hypertrophy. 2. Severe hypokinesis of the left ventricular, entire inferior wall and inferoseptal wall. 3. Elevated left atrial and left ventricular end-diastolic pressures. 4. Left ventricular diastolic parameters are consistent with Grade I diastolic dysfunction (impaired relaxation). 5. The left ventricle demonstrates regional wall motion abnormalities. 6. Global right ventricle has mildly reduced systolic function.The right ventricular size is normal. No increase in right ventricular wall thickness. 7. Left atrial size was moderately dilated. 8. Right atrial size was normal. 9. The mitral valve is abnormal. Mild mitral valve regurgitation. Posterior leaflet tethering is suspected. 10. The tricuspid valve is grossly normal. Tricuspid valve regurgitation is trivial. 11. The aortic valve is tricuspid. Aortic valve regurgitation is trivial. Mild aortic valve sclerosis without stenosis. 12. The pulmonic valve was grossly normal. Pulmonic valve regurgitation is not visualized. 13. Possible atrial shunt by color doppler. 14. Cannot exclude ASD/PFO. Consider transesophageal echocardiogram, if clinically indicated. 15. The inferior vena cava is normal in size with greater than 50% respiratory variability, suggesting right atrial pressure of 3 mmHg. 16. The left ventricular function has worsened.  Patient Profile     78  y.o. male with CAD post CABG 2001 LIMA to LAD vein graft to marginal branch vein graft to right coronary artery with recent cholecystectomy laparoscopic on 01/02/2019 here with bilateral pneumonia shortness of breath and non-ST elevation myocardial infarction.  Assessment & Plan    1.  Non-STEMI, status post CABG revascularization surgery 2001: Cath data reviewed.  Patent LIMA to LAD, previously documented old occlusion of the vein graft to the circumflex and new high-grade thrombotic subtotal stenosis of the vein graft supplying the PDA.  He underwent successful PCI to this vein graft with thrombectomy and DES stenting x3.  Patient has proximal left main and native circumflex disease.  Initial plan is to increase medical therapy trial but if he develops recurrent symptomatology will need consideration for complex intervention.  Will empirically increase amlodipine to 5 mg daily.  Continue isosorbide and nebivolol.  Recommend long-term DAPT indefinitely.  Cardiac stable.  2.  Chronic kidney disease, stage III.  Creatinine slightly increased today at 1.44 increased from 1.26 yesterday.  3. Hyperlipidemia: Cholesterol March 2020 LDL 34 on statin/Zetia  4.  Essential hypertension: Blood pressure controlled on current therapy  5.  Pneumonia: Covid negative.  On Rocephin/azithromycin.  Leukocytosis resolved  6.  Cholecystitis, status post laparoscopic cholecystectomy      Signed, Troy Sine, MD, [facility] 01/08/2019, 8:32 AM

## 2019-01-08 NOTE — Progress Notes (Signed)
Safety plan with call light and bed alarm reviewed.  Pt declined bed alarm, stating he has been getting up and going to bathroom by himself.  Call light within reach.  Will continue to monitor patient closely.

## 2019-01-08 NOTE — Discharge Summary (Signed)
Physician Discharge Summary  [name] [id number] DOB: 06-Oct-1940 DOA: 01/04/2019  PCP: Isaac Bliss, Rayford Halsted, MD  Admit date: 01/04/2019 Discharge date: 01/08/2019  Admitted From: Home Disposition:  Home  Recommendations for Outpatient Follow-up:  1. Follow up with PCP in 1 week 2. Follow up with Cardiology in 2 weeks   Discharge Condition: Stable CODE STATUS: Full  Diet recommendation: Heart healthy   Brief/Interim Summary: Jacob Kerner Chandleris a 78 y.o.malewith medical history significant ofcoronary artery disease, COPD, hypertension, hyperlipidemia with recent cholecystectomy who presents with sudden onset of shortness of breath. He had an uncomplicated surgical procedure 2 days ago, woke up this morning feeling short of breath and could not walk a short distance in his house. He admits to feeling hot and cold but did not take a temperature at home, admits to a productive cough, shortness of breath but no chest pain, no nausea and vomiting, improved abdominal pain postsurgically, and continued diarrhea. Labs obtained which revealed creatinine stable at 1.5, BNP 1392, troponin 162 which subsequently elevated to 2259, WBC 22.8. Covid negative. CTA chest was negative for PE but did reveal multifocal pneumonia.  He was started on empiric antibiotics. Cardiology also consulted for elevated troponin.  Ultimately patient underwent heart catheterization and required PCI. He was weaned off O2.   Discharge Diagnoses:  Principal Problem:   Acute hypoxemic respiratory failure (HCC) Active Problems:   Depression   Essential hypertension   Restless leg syndrome   Hx of CABG   Hyperlipidemia LDL goal <70   GERD (gastroesophageal reflux disease)   CKD (chronic kidney disease), stage III   CAD (coronary artery disease)   Multifocal pneumonia   Severe sepsis (HCC)   Elevated troponin   Acute on chronic diastolic (congestive) heart failure (HCC)   Acute respiratory  failure (HCC)   Non-ST elevation (NSTEMI) myocardial infarction ([place])   Acute hypoxemic respiratory failure -CTA chest negative for PE, but did show extensive bilateral ground-glass and airspace opacities are noted throughout both lungs concerning for multi focal pneumonia. -Required NRB at presentation to ED, now weaned to room air  Severe sepsis secondary to multifocal pneumonia, sepsis POA, with elevated troponin and respiratory failure  -Covid negative  -Rocephin/Azithromycin --> Omnicef/Azithromycin on discharge  -Leukocytosis resolved, afebrile overnight  NSTEMI CAD s/p CABG  -Appreciate cardiology consult -Status post heart cath with PCI -Continue aspirin, Plavix, imdur, crestor  -Will need dual antiplatelet indefinitely  Acute on chronic systolic and diastolic HF -Appreciate cardiology consult  -Echocardiogram shows EF 40 to 45%, severe hypokinesis left ventricular, inferior wall, inferior septal wall -Stable  HTN -Amlodipine, Bystolic -Amlodipine dose increased to 5 mg daily  HLD -Crestor, Zetia  CKD stage 3b  -Baseline Cr ~1.6-1.7 -Stable Cr 1.44 this morning   S/p cholecystectomy  -Stable  Depression -Continue Effexor  GERD, hx Barrett's esophagus -PPI    Discharge Instructions  Discharge Instructions    Amb Referral to Cardiac Rehabilitation   Complete by: As directed    Diagnosis:  Coronary Stents NSTEMI     After initial evaluation and assessments completed: Virtual Based Care may be provided alone or in conjunction with Phase 2 Cardiac Rehab based on patient barriers.: Yes   Call MD for:  difficulty breathing, headache or visual disturbances   Complete by: As directed    Call MD for:  extreme fatigue   Complete by: As directed    Call MD for:  persistant dizziness or light-headedness   Complete by: As directed  Call MD for:  persistant nausea and vomiting   Complete by: As directed    Call MD for:  severe uncontrolled pain    Complete by: As directed    Call MD for:  temperature >100.4   Complete by: As directed    Diet - low sodium heart healthy   Complete by: As directed    Discharge instructions   Complete by: As directed    You were cared for by a hospitalist during your hospital stay. If you have any questions about your discharge medications or the care you received while you were in the hospital after you are discharged, you can call the unit and ask to speak with the hospitalist on call if the hospitalist that took care of you is not available. Once you are discharged, your primary care physician will handle any further medical issues. Please note that NO REFILLS for any discharge medications will be authorized once you are discharged, as it is imperative that you return to your primary care physician (or establish a relationship with a primary care physician if you do not have one) for your aftercare needs so that they can reassess your need for medications and monitor your lab values.   Increase activity slowly   Complete by: As directed      Allergies as of 01/08/2019      Reactions   Losartan Potassium Other (See Comments)   Hyperkalemia   Tape Itching, Rash   reddened skin      Medication List    TAKE these medications   acetaminophen 325 MG tablet Commonly known as: TYLENOL Take 650 mg by mouth every 6 (six) hours as needed for mild pain.   amLODipine 2.5 MG tablet Commonly known as: NORVASC Take 2 tablets (5 mg total) by mouth daily. What changed: how much to take   aspirin EC 81 MG tablet Take 81 mg by mouth at bedtime.   azithromycin 250 MG tablet Commonly known as: ZITHROMAX Take 1 tablet (250 mg total) by mouth daily for 1 day.   BENGAY EX Apply 1 application topically daily as needed (pain).   Bystolic 10 MG tablet Generic drug: nebivolol Take 10 mg by mouth daily.   cefdinir 300 MG capsule Commonly known as: OMNICEF Take 1 capsule (300 mg total) by mouth 2 (two) times  daily for 1 day.   clopidogrel 75 MG tablet Commonly known as: PLAVIX Take 1 tablet (75 mg total) by mouth daily with breakfast.   cyanocobalamin 1000 MCG/ML injection Commonly known as: (VITAMIN B-12) INJECT 1 ML (CC) INTRAMUSCULARLY FOR 1 DOSE What changed:   how much to take  how to take this  when to take this  additional instructions   dicyclomine 10 MG capsule Commonly known as: BENTYL Take 1 capsule (10 mg total) by mouth every 8 (eight) hours as needed for spasms.   docusate sodium 100 MG capsule Commonly known as: Colace Take 1 capsule (100 mg total) by mouth 2 (two) times daily. OK to decrease to once daily or discontinue if having diarrhea   ezetimibe 10 MG tablet Commonly known as: ZETIA Take 1 tablet (10 mg total) by mouth daily.   Fiber Complete Tabs Take 1 tablet by mouth daily.   fluticasone 50 MCG/ACT nasal spray Commonly known as: FLONASE Place 1-2 sprays into both nostrils at bedtime as needed for allergies or rhinitis.   folic acid A999333 MCG tablet Commonly known as: FOLVITE Take 400 mcg by mouth daily.  isosorbide mononitrate 60 MG 24 hr tablet Commonly known as: IMDUR Take 1 tablet (60 mg total) by mouth daily.   multivitamin with minerals tablet Take 1 tablet by mouth daily.   NEEDLE (DISP) 25 G 25G X 1" Misc Commonly known as: B-D DISP NEEDLE 25GX1" Inject 1000 mcg into muscle once a month.   nitroGLYCERIN 0.4 MG SL tablet Commonly known as: NITROSTAT Place 1 tablet (0.4 mg total) under the tongue every 5 (five) minutes as needed for chest pain.   omeprazole 40 MG capsule Commonly known as: PriLOSEC Take 1 capsule (40 mg total) by mouth 2 (two) times daily after a meal.   Opcon-A 0.027-0.315 % Soln Generic drug: Naphazoline-Pheniramine Place 1 drop into both eyes daily as needed (itching eyes).   PROBIOTIC ADVANCED PO Take 1 tablet by mouth 2 (two) times daily.   rOPINIRole 2 MG tablet Commonly known as: REQUIP TAKE 1 TABLET  BY MOUTH AT BEDTIME   rosuvastatin 20 MG tablet Commonly known as: CRESTOR Take 20 mg by mouth daily.   traMADol 50 MG tablet Commonly known as: Ultram Take 1 tablet (50 mg total) by mouth every 6 (six) hours as needed.   venlafaxine 75 MG tablet Commonly known as: EFFEXOR Take 1 tablet (75 mg total) by mouth daily.      Follow-up Information    Isaac Bliss, Rayford Halsted, MD. Schedule an appointment as soon as possible for a visit in 1 week(s).   Specialty: Internal Medicine Contact information: [place] [address] [phone]        Troy Sine, MD. Schedule an appointment as soon as possible for a visit in 2 week(s).   Specialty: Cardiology Contact information: [address] [place] [address] [phone]          Allergies  Allergen Reactions  . Losartan Potassium Other (See Comments)    Hyperkalemia  . Tape Itching and Rash    reddened skin     Consultations:  Cardiology    Procedures/Studies: DG Chest 2 View  Result Date: 01/04/2019 CLINICAL DATA:  Shortness of breath, history of recent cholecystectomy EXAM: CHEST - 2 VIEW COMPARISON:  11/02/2018 FINDINGS: Cardiac shadow is accentuated by the portable technique. Aortic calcifications are noted. Postsurgical changes are seen. Patchy infiltrates are identified bilaterally which are new from the prior exam. Some scarring in the left mid lung as well as chronic left effusion is noted. Common bile duct stents are noted on the margin of the film. Postsurgical changes in the cervical spine are seen. IMPRESSION: Bilateral infiltrative opacities new from the prior exam consistent with multifocal pneumonia. Chronic changes in the left base. Electronically Signed   By: Inez Catalina M.D.   On: 01/04/2019 13:05   CT Angio Chest PE W/Cm &/Or Wo Cm  Result Date: 01/04/2019 CLINICAL DATA:  Shortness of breath. Recent surgery. Evaluate for pulmonary embolus. EXAM: CT  ANGIOGRAPHY CHEST WITH CONTRAST TECHNIQUE: Multidetector CT imaging of the chest was performed using the standard protocol during bolus administration of intravenous contrast. Multiplanar CT image reconstructions and MIPs were obtained to evaluate the vascular anatomy. CONTRAST:  129mL OMNIPAQUE IOHEXOL 350 MG/ML SOLN COMPARISON:  CT chest 04/04/2010 FINDINGS: Cardiovascular: Previous median sternotomy and CABG procedure. Heart size normal. No pericardial effusion. Aortic atherosclerosis. The main pulmonary artery appears patent. There is no central obstructing pulmonary emboli identified bilaterally. No lobar or segmental pulmonary artery filling defects identified. Mediastinum/Nodes: The trachea appears patent and is midline. Normal  appearance of the thyroid gland. Normal appearance of the esophagus. No enlarged mediastinal or hilar adenopathy. Lungs/Pleura: Mild diffuse pleural thickening overlying the left lung is identified. Multifocal bilateral airspace and ground-glass densities are noted throughout both lungs. These findings correspond with the chest radiograph findings from earlier today and are favored to represent multifocal pneumonia. Upper Abdomen: Postoperative changes within the upper abdomen are identified. Biliary drain is noted. Pneumoperitoneum is identified within the upper abdomen, likely postoperative. Musculoskeletal: No chest wall abnormality. No acute or significant osseous findings. Review of the MIP images confirms the above findings. IMPRESSION: 1. No evidence for acute pulmonary embolus. 2. Extensive bilateral ground-glass and airspace opacities are noted throughout both lungs concerning for multi focal pneumonia. 3. Mild diffuse pleural thickening overlies the left lung with decrease in volume of left pleural effusion. Aortic Atherosclerosis (ICD10-I70.0). Electronically Signed   By: Kerby Moors M.D.   On: 01/04/2019 16:59   CARDIAC CATHETERIZATION  Result Date:  01/05/2019 Conclusions: 1. Severe native coronary artery disease, including 75% ostial LMCA stenosis, 50% mid LAD disease, sequential 90% and 70% proximal/mid LCx stenoses as well as 70% mid OM1 lesion, and sequential 70% ostial, 80% proximal, and 95% distal RCA stenoses. 2. Widely patent LIMA-LAD. 3. Chronically occluded SVG-OM1. 4. Subtotally occluded SVG-rPDA-rPL with 99% mid graft stenosis followed by extensive thrombus, some of which has embolized into the distal branches. 5. Upper normal left ventricular filling pressure. 6. Successful PCI to SVG-rPDA-rPL using overlapping Resolute Onyx 3.0 x 18 mm, 3.0 x 38 mm, and 3.0 x 38 mm drug-eluting stents with 0% residual stenosis and TIMI-3 flow.  Small amount of nonocclusive thrombus remained in the rPL branch at the end of the procedure. Recommendations: 1. Indefinite dual antiplatelet therapy with aspirin and clopidogrel, as tolerated. 2. Medical therapy of protected LMCA and LCx disease.  If the patient has refractory symptoms, complex PCI of the ostial LMCA, LCx, and OM1 would need to be considered. 3. Aggressive secondary prevention. 4. Gentle post-catheterization hydration with close monitoring of renal function and hemoglobin, given contrast exposure and glycoprotein IIb/IIIa use during the intervention. Nelva Bush, MD [facility]   ECHOCARDIOGRAM COMPLETE  Result Date: 01/05/2019   ECHOCARDIOGRAM REPORT   Patient Name:   [name] Date of Exam: 01/05/2019 Medical Rec #:  [id number]         Height:       69.0 in Accession #:    [id number]        Weight:       176.1 lb Date of Birth:  07/14/1940         BSA:          1.96 m Patient Age:    78 years          BP:           120/72 mmHg Patient Gender: M                 HR:           91 bpm. Exam Location:  Inpatient Procedure: 2D Echo, Cardiac Doppler and Color Doppler Indications:    Dyspnea 786.09 / R06.00  History:        Patient has prior history of Echocardiogram examinations, most                  recent 12/23/2017. CAD, COPD; Risk Factors:Hypertension,                 Dyslipidemia, Sleep Apnea  and GERD. Cancer.  Sonographer:    Jonelle Sidle Dance Referring Phys: [id number] [place]  1. Left ventricular ejection fraction, by visual estimation, is 40 to 45%. The left ventricle has mild to moderately decreased function. There is mildly increased left ventricular hypertrophy.  2. Severe hypokinesis of the left ventricular, entire inferior wall and inferoseptal wall.  3. Elevated left atrial and left ventricular end-diastolic pressures.  4. Left ventricular diastolic parameters are consistent with Grade I diastolic dysfunction (impaired relaxation).  5. The left ventricle demonstrates regional wall motion abnormalities.  6. Global right ventricle has mildly reduced systolic function.The right ventricular size is normal. No increase in right ventricular wall thickness.  7. Left atrial size was moderately dilated.  8. Right atrial size was normal.  9. The mitral valve is abnormal. Mild mitral valve regurgitation. Posterior leaflet tethering is suspected. 10. The tricuspid valve is grossly normal. Tricuspid valve regurgitation is trivial. 11. The aortic valve is tricuspid. Aortic valve regurgitation is trivial. Mild aortic valve sclerosis without stenosis. 12. The pulmonic valve was grossly normal. Pulmonic valve regurgitation is not visualized. 13. Possible atrial shunt by color doppler. 14. Cannot exclude ASD/PFO. Consider transesophageal echocardiogram, if clinically indicated. 15. The inferior vena cava is normal in size with greater than 50% respiratory variability, suggesting right atrial pressure of 3 mmHg. 16. The left ventricular function has worsened. In comparison to the previous echocardiogram(s): The left ventricular function is worsened. LVEF 60-65% in 2019. FINDINGS  Left Ventricle: Left ventricular ejection fraction, by visual estimation, is 40 to 45%. The left ventricle has mild  to moderately decreased function. Severe hypokinesis of the left ventricular, entire inferior wall and inferoseptal wall. The left ventricle demonstrates regional wall motion abnormalities. There is mildly increased left ventricular hypertrophy. Left ventricular diastolic parameters are consistent with Grade I diastolic dysfunction (impaired relaxation). Elevated left atrial and left ventricular end-diastolic pressures. Right Ventricle: The right ventricular size is normal. No increase in right ventricular wall thickness. Global RV systolic function is has mildly reduced systolic function. Left Atrium: Left atrial size was moderately dilated. Right Atrium: Right atrial size was normal in size Pericardium: There is no evidence of pericardial effusion. Mitral Valve: The mitral valve is abnormal. Mild mitral valve regurgitation. Tricuspid Valve: The tricuspid valve is grossly normal. Tricuspid valve regurgitation is trivial. Aortic Valve: The aortic valve is tricuspid. Aortic valve regurgitation is trivial. Mild aortic valve sclerosis is present, with no evidence of aortic valve stenosis. Pulmonic Valve: The pulmonic valve was grossly normal. Pulmonic valve regurgitation is not visualized. Pulmonic regurgitation is not visualized. Aorta: The aortic root and ascending aorta are structurally normal, with no evidence of dilitation. Venous: The inferior vena cava is normal in size with greater than 50% respiratory variability, suggesting right atrial pressure of 3 mmHg. IAS/Shunts: Possible atrial shunt by color doppler. Additional Comments: Flow jet at the atrial septum was seen, possible venous return; however, could not exclude ASD/PFO. If clinically indicated consider transesophageal echocardiogram.  LEFT VENTRICLE PLAX 2D LVIDd:         5.15 cm  Diastology LVIDs:         4.15 cm  LV e' lateral:   7.51 cm/s LV PW:         1.20 cm  LV E/e' lateral: 16.6 LV IVS:        1.03 cm  LV e' medial:    4.90 cm/s LVOT diam:      2.40 cm  LV E/e' medial:  25.5  LV SV:         50 ml LV SV Index:   25.33 LVOT Area:     4.52 cm  RIGHT VENTRICLE            IVC RV Basal diam:  2.66 cm    IVC diam: 1.18 cm RV S prime:     9.36 cm/s TAPSE (M-mode): 1.2 cm LEFT ATRIUM             Index       RIGHT ATRIUM           Index LA diam:        5.40 cm 2.76 cm/m  RA Area:     13.90 cm LA Vol (A2C):   58.5 ml 29.89 ml/m RA Volume:   32.80 ml  16.76 ml/m LA Vol (A4C):   84.6 ml 43.22 ml/m LA Biplane Vol: 73.8 ml 37.71 ml/m  AORTIC VALVE LVOT Vmax:   88.60 cm/s LVOT Vmean:  59.800 cm/s LVOT VTI:    0.190 m  AORTA Ao Root diam: 3.70 cm Ao Asc diam:  3.10 cm MITRAL VALVE MV Area (PHT): 4.80 cm              SHUNTS MV PHT:        45.82 msec            Systemic VTI:  0.19 m MV Decel Time: 158 msec              Systemic Diam: 2.40 cm MV E velocity: 125.00 cm/s 103 cm/s MV A velocity: 123.00 cm/s 70.3 cm/s MV E/A ratio:  1.02        1.5  Lyman Bishop MD Electronically signed by Lyman Bishop MD Signature Date/Time: 01/05/2019/10:24:39 AM    Final        Discharge Exam: Vitals:   01/07/19 2127 01/08/19 0555  BP: 118/62 129/63  Pulse: 79 76  Resp: (!) 23 (!) 23  Temp:  98.2 F (36.8 C)  SpO2: 94% 97%    General: Pt is alert, awake, not in acute distress Cardiovascular: RRR, S1/S2 +, no edema Respiratory: CTA bilaterally, no wheezing, no rhonchi, no respiratory distress Abdominal: Soft, NT, ND, bowel sounds + Extremities: no edema, no cyanosis Psych: Stable     The results of significant diagnostics from this hospitalization (including imaging, microbiology, ancillary and laboratory) are listed below for reference.     Microbiology: Recent Results (from the past 240 hour(s))  Novel Coronavirus, NAA (Hosp order, Send-out to Ref Lab; TAT 18-24 hrs     Status: None   Collection Time: 12/29/18  4:17 PM   Specimen: Nasopharyngeal Swab; Respiratory  Result Value Ref Range Status   SARS-CoV-2, NAA NOT DETECTED NOT DETECTED Final     Comment: (NOTE) This nucleic acid amplification test was developed and its performance characteristics determined by [employer]. Nucleic acid amplification tests include PCR and TMA. This test has not been FDA cleared or approved. This test has been authorized by FDA under an Emergency Use Authorization (EUA). This test is only authorized for the duration of time the declaration that circumstances exist justifying the authorization of the emergency use of in vitro diagnostic tests for detection of SARS-CoV-2 virus and/or diagnosis of COVID-19 infection under section 564(b)(1) of the Act, 21 U.S.C. [id number]) (1), unless the authorization is terminated or revoked sooner. When diagnostic testing is negative, the possibility of a false negative result should be considered in the context of a patient's recent  exposures and the presence of clinical signs and symptoms consistent with COVID-19. An individual without symptoms of COVID- 19 and who is not shedding SARS-CoV-2 vi rus would expect to have a negative (not detected) result in this assay. Performed At: [facility] [address] [place], [place] [id number] Rush Farmer MD [id number]    [place]  Final    Comment: Performed at [facility], [place] [address] [place], [place] [address]  SARS CORONAVIRUS 2 (TAT 6-24 HRS) Nasopharyngeal Nasopharyngeal Swab     Status: None   Collection Time: 01/04/19  3:06 PM   Specimen: Nasopharyngeal Swab  Result Value Ref Range Status   SARS Coronavirus 2 NEGATIVE NEGATIVE Final    Comment: (NOTE) SARS-CoV-2 target nucleic acids are NOT DETECTED. The SARS-CoV-2 RNA is generally detectable in upper and lower respiratory specimens during the acute phase of infection. Negative results do not preclude SARS-CoV-2 infection, do not rule out co-infections with other pathogens, and should not be used as the sole basis for treatment or other patient  management decisions. Negative results must be combined with clinical observations, patient history, and epidemiological information. The expected result is Negative. Fact Sheet for Patients: SugarRoll.be Fact Sheet for Healthcare Providers: https://www.woods-mathews.com/ This test is not yet approved or cleared by the [place] FDA and  has been authorized for detection and/or diagnosis of SARS-CoV-2 by FDA under an Emergency Use Authorization (EUA). This EUA will remain  in effect (meaning this test can be used) for the duration of the COVID-19 declaration under Section 56 4(b)(1) of the Act, 21 U.S.C. section 360bbb-3(b)(1), unless the authorization is terminated or revoked sooner. Performed at [facility], [place] [address], [place], [place] [address]      Labs: BNP (last 3 results) Recent Labs    04/07/18 1543 01/04/19 1327  BNP 319.7* [id number]*   Basic Metabolic Panel: Recent Labs  Lab 01/04/19 1327 01/05/19 0354 01/06/19 0323 01/07/19 1009 01/08/19 0336  NA 141 144 141 140 143  K 3.9 4.8 3.8 3.6 3.8  CL 103 101 104 102 104  CO2 27 29 25 29 28   GLUCOSE 123* 152* 127* 206* 132*  BUN 25* 27* 24* 21 19  CREATININE 1.51* 1.64* 1.30* 1.26* 1.44*  CALCIUM 9.1 9.0 8.3* 8.7* 8.4*   Liver Function Tests: Recent Labs  Lab 01/04/19 1327  AST 42*  ALT 25  ALKPHOS 66  BILITOT 0.8  PROT 7.6  ALBUMIN 3.1*   No results for input(s): LIPASE, AMYLASE in the last 168 hours. No results for input(s): AMMONIA in the last 168 hours. CBC: Recent Labs  Lab 01/04/19 1153 01/05/19 0354 01/06/19 0323 01/07/19 1009 01/08/19 0336  WBC 22.8* 15.5* 8.1 6.6 6.8  HGB 12.2* 11.1* 9.2* 9.6* 9.2*  HCT 40.5 34.8* 29.0* 30.2* 28.4*  MCV 93.1 87.4 86.8 86.8 85.8  PLT 174 201 174 193 198   Cardiac Enzymes: No results for input(s): CKTOTAL, CKMB, CKMBINDEX, TROPONINI in the last 168 hours. BNP: Invalid input(s): POCBNP CBG: No  results for input(s): GLUCAP in the last 168 hours. D-Dimer No results for input(s): DDIMER in the last 72 hours. Hgb A1c No results for input(s): HGBA1C in the last 72 hours. Lipid Profile No results for input(s): CHOL, HDL, LDLCALC, TRIG, CHOLHDL, LDLDIRECT in the last 72 hours. Thyroid function studies No results for input(s): TSH, T4TOTAL, T3FREE, THYROIDAB in the last 72 hours.  Invalid input(s): FREET3 Anemia work up No results for input(s): VITAMINB12, FOLATE, FERRITIN,  TIBC, IRON, RETICCTPCT in the last 72 hours. Urinalysis    Component Value Date/Time   COLORURINE AMBER (A) 03/30/2018 2045   APPEARANCEUR CLEAR 03/30/2018 2045   LABSPEC 1.014 03/30/2018 2045   PHURINE 5.0 03/30/2018 2045   GLUCOSEU NEGATIVE 03/30/2018 2045   HGBUR NEGATIVE 03/30/2018 2045   BILIRUBINUR SMALL (A) 03/30/2018 2045   BILIRUBINUR 3 06/21/2015 0838   KETONESUR NEGATIVE 03/30/2018 2045   PROTEINUR 30 (A) 03/30/2018 2045   UROBILINOGEN 0.2 06/21/2015 0838   NITRITE NEGATIVE 03/30/2018 2045   LEUKOCYTESUR NEGATIVE 03/30/2018 2045   Sepsis Labs Invalid input(s): PROCALCITONIN,  WBC,  LACTICIDVEN Microbiology Recent Results (from the past 240 hour(s))  Novel Coronavirus, NAA (Hosp order, Send-out to Ref Lab; TAT 18-24 hrs     Status: None   Collection Time: 12/29/18  4:17 PM   Specimen: Nasopharyngeal Swab; Respiratory  Result Value Ref Range Status   SARS-CoV-2, NAA NOT DETECTED NOT DETECTED Final    Comment: (NOTE) This nucleic acid amplification test was developed and its performance characteristics determined by [employer]. Nucleic acid amplification tests include PCR and TMA. This test has not been FDA cleared or approved. This test has been authorized by FDA under an Emergency Use Authorization (EUA). This test is only authorized for the duration of time the declaration that circumstances exist justifying the authorization of the emergency use of in vitro diagnostic tests  for detection of SARS-CoV-2 virus and/or diagnosis of COVID-19 infection under section 564(b)(1) of the Act, 21 U.S.C. [id number]) (1), unless the authorization is terminated or revoked sooner. When diagnostic testing is negative, the possibility of a false negative result should be considered in the context of a patient's recent exposures and the presence of clinical signs and symptoms consistent with COVID-19. An individual without symptoms of COVID- 19 and who is not shedding SARS-CoV-2 vi rus would expect to have a negative (not detected) result in this assay. Performed At: [facility] [address] [place], [place] [id number] Rush Farmer MD [id number]    [place]  Final    Comment: Performed at [facility], [place] [address] [place], [place] [address]  SARS CORONAVIRUS 2 (TAT 6-24 HRS) Nasopharyngeal Nasopharyngeal Swab     Status: None   Collection Time: 01/04/19  3:06 PM   Specimen: Nasopharyngeal Swab  Result Value Ref Range Status   SARS Coronavirus 2 NEGATIVE NEGATIVE Final    Comment: (NOTE) SARS-CoV-2 target nucleic acids are NOT DETECTED. The SARS-CoV-2 RNA is generally detectable in upper and lower respiratory specimens during the acute phase of infection. Negative results do not preclude SARS-CoV-2 infection, do not rule out co-infections with other pathogens, and should not be used as the sole basis for treatment or other patient management decisions. Negative results must be combined with clinical observations, patient history, and epidemiological information. The expected result is Negative. Fact Sheet for Patients: SugarRoll.be Fact Sheet for Healthcare Providers: https://www.woods-mathews.com/ This test is not yet approved or cleared by the [place] FDA and  has been authorized for detection and/or diagnosis of SARS-CoV-2 by FDA under an Emergency Use Authorization  (EUA). This EUA will remain  in effect (meaning this test can be used) for the duration of the COVID-19 declaration under Section 56 4(b)(1) of the Act, 21 U.S.C. section 360bbb-3(b)(1), unless the authorization is terminated or revoked sooner. Performed at [facility], [place] [address] [place], [place] [address]      Patient was seen and examined on the  day of discharge and was found to be in stable condition. Time coordinating discharge: 25 minutes including assessment and coordination of care, as well as examination of the patient.   SIGNED:  Dessa Phi, DO [facility] 01/08/2019, 9:29 AM

## 2019-01-09 ENCOUNTER — Telehealth: Payer: Self-pay

## 2019-01-09 NOTE — Telephone Encounter (Signed)
Transition Care Management Follow-up Telephone Call  Date of discharge and from where: 01/08/2019 from [facility]  How have you been since you were released from the hospital? "I'm still not breathing too good".  Any questions or concerns? Yes ; he states he needs his new prescription written for pain medication  Items Reviewed:  Did the pt receive and understand the discharge instructions provided? Yes   Medications obtained and verified? Yes   Any new allergies since your discharge? No   Dietary orders reviewed? Yes  Do you have support at home? Yes   Other (ie: DME, Home Health, etc) N/A  Functional Questionnaire: (I = Independent and D = Dependent) ADL's: Patient is able to take care of his own needs since home from hospital.   Bathing/Dressing-    Meal Prep-   Eating-   Maintaining continence-   Transferring/Ambulation-   Managing Meds-    Follow up appointments reviewed:    PCP Hospital f/u appt confirmed? Yes  Dr. Jerilee Hoh office will notify him of appointment  Specialist Hospital f/u appt confirmed? Yes  Scheduled to see multiple specialists in next several months.  Are transportation arrangements needed? No   If their condition worsens, is the pt aware to call  their PCP or go to the ED? Yes  Was the patient provided with contact information for the PCP's office or ED? Yes  Was the pt encouraged to call back with questions or concerns? Yes

## 2019-01-10 ENCOUNTER — Ambulatory Visit (HOSPITAL_COMMUNITY): Admission: RE | Admit: 2019-01-10 | Payer: Medicare Other | Source: Ambulatory Visit

## 2019-01-10 ENCOUNTER — Telehealth: Payer: Self-pay | Admitting: Internal Medicine

## 2019-01-10 NOTE — Telephone Encounter (Signed)
Copied from [place] [id number]. Topic: General - Other >> Jan 10, 2019  1:24 PM Keene Breath wrote: Reason for CRM: Patient called to schedule a hosp. Follow up appt. Before 01/20.21.  Also patient would like a refill on the oxycodone that he was taking before.  Please call to discuss (Daughter [name] at [phone])

## 2019-01-10 NOTE — Telephone Encounter (Signed)
Message Routed to PCP CMA 

## 2019-01-11 ENCOUNTER — Telehealth (HOSPITAL_COMMUNITY): Payer: Self-pay

## 2019-01-11 DIAGNOSIS — A419 Sepsis, unspecified organism: Secondary | ICD-10-CM | POA: Diagnosis not present

## 2019-01-11 DIAGNOSIS — J9601 Acute respiratory failure with hypoxia: Secondary | ICD-10-CM | POA: Diagnosis not present

## 2019-01-11 DIAGNOSIS — J440 Chronic obstructive pulmonary disease with acute lower respiratory infection: Secondary | ICD-10-CM | POA: Diagnosis not present

## 2019-01-11 DIAGNOSIS — J189 Pneumonia, unspecified organism: Secondary | ICD-10-CM | POA: Diagnosis not present

## 2019-01-11 DIAGNOSIS — Z48812 Encounter for surgical aftercare following surgery on the circulatory system: Secondary | ICD-10-CM | POA: Diagnosis not present

## 2019-01-11 NOTE — Telephone Encounter (Signed)
Do we know why he was on oxycodone? Can this wait till his HFU appointment? [place]

## 2019-01-11 NOTE — Telephone Encounter (Signed)
Pt insurance is active and benefits verified through [facility]. Co-pay $20.00, DED $0.00/$0.00 met, out of pocket $4,500.00/$3,315.45 met, co-insurance 0%. No pre-authorization required. Passport, 01/11/2019 @ 11:42AM, TRZ#[id number]  Will contact patient to see if he is interested in the Cardiac Rehab Program. If interested, patient will need to complete follow up appt. Once completed, patient will be contacted for scheduling upon review by the RN Navigator.

## 2019-01-11 NOTE — Telephone Encounter (Signed)
Spoke with daughter and virtual hospital follow up scheduled.

## 2019-01-11 NOTE — Telephone Encounter (Signed)
Called patient to see if he is interested in the Cardiac Rehab Program. Patient expressed interest. Explained scheduling process and went over insurance, patient verbalized understanding. Will contact patient for scheduling once f/u has been completed.  °

## 2019-01-12 ENCOUNTER — Telehealth: Payer: Self-pay | Admitting: Internal Medicine

## 2019-01-12 NOTE — Telephone Encounter (Signed)
Medication: oxyCODONE-acetaminophen (PERCOCET) 10-325 MG per tablet [id number]  DISCONTINUED  Has the patient contacted their pharmacy? Yes  (Agent: If no, request that the patient contact the pharmacy for the refill.) (Agent: If yes, when and what did the pharmacy advise?)  Preferred Pharmacy (with phone number or street name): [place], [place] - [id number]  Phone:  [phone] Fax:  [phone]     Agent: Please be advised that RX refills may take up to 3 business days. We ask that you follow-up with your pharmacy.

## 2019-01-14 ENCOUNTER — Other Ambulatory Visit: Payer: Self-pay | Admitting: Internal Medicine

## 2019-01-14 DIAGNOSIS — E038 Other specified hypothyroidism: Secondary | ICD-10-CM

## 2019-01-14 DIAGNOSIS — E538 Deficiency of other specified B group vitamins: Secondary | ICD-10-CM

## 2019-01-17 ENCOUNTER — Encounter: Payer: Self-pay | Admitting: Internal Medicine

## 2019-01-17 ENCOUNTER — Other Ambulatory Visit: Payer: Self-pay

## 2019-01-17 ENCOUNTER — Telehealth (INDEPENDENT_AMBULATORY_CARE_PROVIDER_SITE_OTHER): Payer: Medicare Other | Admitting: Internal Medicine

## 2019-01-17 VITALS — Wt 170.0 lb

## 2019-01-17 DIAGNOSIS — E038 Other specified hypothyroidism: Secondary | ICD-10-CM | POA: Diagnosis not present

## 2019-01-17 DIAGNOSIS — M545 Low back pain, unspecified: Secondary | ICD-10-CM

## 2019-01-17 DIAGNOSIS — E538 Deficiency of other specified B group vitamins: Secondary | ICD-10-CM

## 2019-01-17 DIAGNOSIS — G8929 Other chronic pain: Secondary | ICD-10-CM | POA: Diagnosis not present

## 2019-01-17 DIAGNOSIS — R197 Diarrhea, unspecified: Secondary | ICD-10-CM

## 2019-01-17 MED ORDER — OXYCODONE-ACETAMINOPHEN 10-325 MG PO TABS: 1.0000 | ORAL_TABLET | Freq: Two times a day (BID) | ORAL | 0 refills | Status: AC | PRN

## 2019-01-17 MED ORDER — VENLAFAXINE HCL 75 MG PO TABS: 75.0000 mg | ORAL_TABLET | Freq: Every day | ORAL | 1 refills | Status: DC

## 2019-01-17 MED ORDER — CYANOCOBALAMIN 1000 MCG/ML IJ SOLN: INTRAMUSCULAR | 1 refills | Status: DC

## 2019-01-17 MED ORDER — SACCHAROMYCES BOULARDII 250 MG PO CAPS: 250.0000 mg | ORAL_CAPSULE | Freq: Two times a day (BID) | ORAL | 2 refills | Status: DC

## 2019-01-17 MED ORDER — EZETIMIBE 10 MG PO TABS: 10.0000 mg | ORAL_TABLET | Freq: Every day | ORAL | 1 refills | Status: DC

## 2019-01-17 NOTE — Progress Notes (Signed)
Virtual Visit via Video Note  I connected with [name] on 01/17/19 at 10:30 AM EST by a video enabled telemedicine application and verified that I am speaking with the correct person using two identifiers.  Location patient: home Location provider: work office Persons participating in the virtual visit: patient, provider  I discussed the limitations of evaluation and management by telemedicine and the availability of in person appointments. The patient expressed understanding and agreed to proceed.   HPI:  [name] is a 78 year old man who is coming in today for the above mentioned reasons, specifically transitional care services face-to-face visit.   Dates hospitalized: 01/04/2019-01/08/2019 Days since discharge from hospital: 9 Patient was discharged from the hospital to: Home Reason for admission to hospital: Shortness of breath Date of interactive phone contact with patient and/or caregiver: 01/09/2019  I have reviewed in detail patient's discharge summary plus pertinent specific notes, labs, and images from the hospitalization.  Yes  Day prior to hospital admission patient had an outpatient laparoscopic cholecystectomy that went well.  He woke up the morning of admission very short of breath and with some chest pains.  In the hospital he was noted to have multifocal pneumonia with a negative Covid test he was also found to have elevated troponins and diagnosed with a non-ST elevated MI.  Cardiology was consulted and he underwent a heart catheterization with PCI intervention.  Was also diagnosed as having new systolic heart failure with an ejection fraction of 40 to 45%.  He is now on aspirin, Plavix, Imdur, Crestor.  Since hospital discharge his chest pain is gone and his shortness of breath has improved significantly.  He completed his course of antibiotics last week.  His main complaints now seem to revolve around his bowels.  Ever since his discharge from the  hospital he is having 4-5 bowel movements a day.  He is also due for his oxycodone refills, also needs refills of his Effexor. He has an appointment with his GI doctor on Thursday, he is also requesting a urology referral due to his history of bladder cancer.  His previous urologist, Dr. Risa Grill has now retired.  Medication reconciliation was done today and patient is taking meds as recommended by discharging hospitalist/specialist.  Yes   ROS: Constitutional: Denies fever, chills, diaphoresis, appetite change and fatigue.  HEENT: Denies photophobia, eye pain, redness, hearing loss, ear pain, congestion, sore throat, rhinorrhea, sneezing, mouth sores, trouble swallowing, neck pain, neck stiffness and tinnitus.   Respiratory: Denies  cough, chest tightness,  and wheezing.   Cardiovascular: Denies chest pain, palpitations and leg swelling.  Gastrointestinal: Denies nausea, vomiting, abdominal pain,  constipation, blood in stool and abdominal distention.  Genitourinary: Denies dysuria, urgency, frequency, hematuria, flank pain and difficulty urinating.  Endocrine: Denies: hot or cold intolerance, sweats, changes in hair or nails, polyuria, polydipsia. Musculoskeletal: Denies myalgias, back pain, joint swelling, arthralgias and gait problem.  Skin: Denies pallor, rash and wound.  Neurological: Denies dizziness, seizures, syncope, weakness, light-headedness, numbness and headaches.  Hematological: Denies adenopathy. Easy bruising, personal or family bleeding history  Psychiatric/Behavioral: Denies suicidal ideation, mood changes, confusion, nervousness, sleep disturbance and agitation   Past Medical History:  Diagnosis Date  . Allergy   . B12 deficiency anemia   . Blood transfusion without reported diagnosis   . CAD (coronary artery disease)   . Cancer ([place])    bladder-   . Colon polyps   . COPD (chronic obstructive pulmonary disease) ([place])   .  Depression   . Esophagus, Barrett's   . GERD  (gastroesophageal reflux disease)   . History of bladder cancer    Bladder cancer "8 times"  . History of hiatal hernia   . Hyperlipidemia   . Hypertension   . Localized osteoarthrosis, lower leg   . Restless leg syndrome   . Sleep apnea    does not wear cpap  . Stenosis of esophagus     Past Surgical History:  Procedure Laterality Date  . BILIARY BRUSHING  04/01/2018   Procedure: BILIARY BRUSHING;  Surgeon: Rush Landmark Telford Nab., MD;  Location: [place];  Service: Gastroenterology;;  . BILIARY BRUSHING  09/12/2018   Procedure: BILIARY BRUSHING;  Surgeon: Irving Copas., MD;  Location: [place];  Service: Gastroenterology;;  . BILIARY BRUSHING  11/28/2018   Procedure: BILIARY BRUSHING;  Surgeon: Irving Copas., MD;  Location: [place];  Service: Gastroenterology;;  . BILIARY DILATION  09/12/2018   Procedure: BILIARY DILATION;  Surgeon: Irving Copas., MD;  Location: [place];  Service: Gastroenterology;;  . BILIARY DILATION  11/28/2018   Procedure: BILIARY DILATION;  Surgeon: Irving Copas., MD;  Location: [place];  Service: Gastroenterology;;  . BILIARY STENT PLACEMENT  04/01/2018   Procedure: BILIARY STENT PLACEMENT;  Surgeon: Irving Copas., MD;  Location: [place];  Service: Gastroenterology;;  . BILIARY STENT PLACEMENT  09/12/2018   Procedure: BILIARY STENT PLACEMENT;  Surgeon: Irving Copas., MD;  Location: [place];  Service: Gastroenterology;;  . BILIARY STENT PLACEMENT  11/28/2018   Procedure: BILIARY STENT PLACEMENT;  Surgeon: Irving Copas., MD;  Location: [place];  Service: Gastroenterology;;  . BIOPSY  04/01/2018   Procedure: BIOPSY;  Surgeon: Irving Copas., MD;  Location: [place];  Service: Gastroenterology;;  . BIOPSY  09/12/2018   Procedure: BIOPSY;  Surgeon: Irving Copas., MD;  Location: [place];  Service: Gastroenterology;;  . BIOPSY  11/28/2018    Procedure: BIOPSY;  Surgeon: Irving Copas., MD;  Location: [facility] ENDOSCOPY;  Service: Gastroenterology;;  . bladder cancer      x 8 cystoscopy  . CERVICAL DISCECTOMY     ACDF  . COLONOSCOPY  11/17/2005   normal   . CORONARY ARTERY BYPASS GRAFT     x4  . CORONARY STENT INTERVENTION N/A 01/05/2019   Procedure: CORONARY STENT INTERVENTION;  Surgeon: Nelva Bush, MD;  Location: [place] [facility];  Service: Cardiovascular;  Laterality: N/A;  . ENDOSCOPIC MUCOSAL RESECTION  09/12/2018   Procedure: ENDOSCOPIC MUCOSAL RESECTION;  Surgeon: Rush Landmark, Telford Nab., MD;  Location: [facility] ENDOSCOPY;  Service: Gastroenterology;;  . ENDOSCOPIC RETROGRADE CHOLANGIOPANCREATOGRAPHY (ERCP) WITH PROPOFOL N/A 04/01/2018   Procedure: ENDOSCOPIC RETROGRADE CHOLANGIOPANCREATOGRAPHY (ERCP) WITH PROPOFOL;  Surgeon: Irving Copas., MD;  Location: [place];  Service: Gastroenterology;  Laterality: N/A;  . ENDOSCOPIC RETROGRADE CHOLANGIOPANCREATOGRAPHY (ERCP) WITH PROPOFOL N/A 09/12/2018   Procedure: ENDOSCOPIC RETROGRADE CHOLANGIOPANCREATOGRAPHY (ERCP) WITH PROPOFOL;  Surgeon: Rush Landmark Telford Nab., MD;  Location: [place];  Service: Gastroenterology;  Laterality: N/A;  . ERCP N/A 11/28/2018   Procedure: ENDOSCOPIC RETROGRADE CHOLANGIOPANCREATOGRAPHY (ERCP) +EGD with spyglass;  Surgeon: Rush Landmark Telford Nab., MD;  Location: [place];  Service: Gastroenterology;  Laterality: N/A;  . ESOPHAGOGASTRODUODENOSCOPY  04/29/2010  . ESOPHAGOGASTRODUODENOSCOPY (EGD) WITH PROPOFOL N/A 04/01/2018   Procedure: ESOPHAGOGASTRODUODENOSCOPY (EGD) WITH PROPOFOL;  Surgeon: Rush Landmark Telford Nab., MD;  Location: [place];  Service: Gastroenterology;  Laterality: N/A;  . ESOPHAGOGASTRODUODENOSCOPY (EGD) WITH PROPOFOL N/A 09/12/2018   Procedure: ESOPHAGOGASTRODUODENOSCOPY (EGD) WITH PROPOFOL;  Surgeon: Rush Landmark,  Telford Nab., MD;  Location: [facility];  Service: Gastroenterology;  Laterality: N/A;  .  ESOPHAGOGASTRODUODENOSCOPY (EGD) WITH PROPOFOL N/A 11/28/2018   Procedure: ESOPHAGOGASTRODUODENOSCOPY (EGD) WITH PROPOFOL;  Surgeon: Rush Landmark Telford Nab., MD;  Location: [place];  Service: Gastroenterology;  Laterality: N/A;  . EUS  04/01/2018   Procedure: FULL UPPER ENDOSCOPIC ULTRASOUND (EUS) RADIAL;  Surgeon: Irving Copas., MD;  Location: [place];  Service: Gastroenterology;;  . EUS N/A 09/12/2018   Procedure: UPPER ENDOSCOPIC ULTRASOUND (EUS) RADIAL;  Surgeon: Irving Copas., MD;  Location: [place];  Service: Gastroenterology;  Laterality: N/A;  . FINE NEEDLE ASPIRATION  09/12/2018   Procedure: FINE NEEDLE ASPIRATION (FNA) LINEAR;  Surgeon: Irving Copas., MD;  Location: [place];  Service: Gastroenterology;;  . HEMOSTASIS CLIP PLACEMENT  09/12/2018   Procedure: HEMOSTASIS CLIP PLACEMENT;  Surgeon: Irving Copas., MD;  Location: [facility]COPY;  Service: Gastroenterology;;  . I & D EXTREMITY Left 11/24/2016   Procedure: IRRIGATION AND DEBRIDEMENT LEFT HAND, THUMB, INDEX, MIDDLE, RING, AND SMALL FINGERS WITH RECONSTRUCTION;  Surgeon: Roseanne Kaufman, MD;  Location: [place];  Service: Orthopedics;  Laterality: Left;  . KNEE ARTHROSCOPY Left   . LEFT HEART CATH AND CORS/GRAFTS ANGIOGRAPHY N/A 01/05/2019   Procedure: LEFT HEART CATH AND CORS/GRAFTS ANGIOGRAPHY;  Surgeon: Nelva Bush, MD;  Location: [place] CV [facility];  Service: Cardiovascular;  Laterality: N/A;  . LUMBAR LAMINECTOMY     and fusion x 2  . NASAL SINUS SURGERY    . POPLITEAL SYNOVIAL CYST EXCISION    . REMOVAL OF STONES  04/01/2018   Procedure: REMOVAL OF STONES;  Surgeon: Rush Landmark Telford Nab., MD;  Location: [place];  Service: Gastroenterology;;  . REMOVAL OF STONES  09/12/2018   Procedure: REMOVAL OF STONES;  Surgeon: Irving Copas., MD;  Location: [place];  Service: Gastroenterology;;  . REMOVAL OF STONES  11/28/2018   Procedure: REMOVAL OF STONES;   Surgeon: Irving Copas., MD;  Location: [place];  Service: Gastroenterology;;  . Azzie Almas DILATION N/A 09/12/2018   Procedure: Azzie Almas DILATION;  Surgeon: Irving Copas., MD;  Location: [place];  Service: Gastroenterology;  Laterality: N/A;  . SAVORY DILATION N/A 11/28/2018   Procedure: SAVORY DILATION;  Surgeon: Rush Landmark Telford Nab., MD;  Location: [place];  Service: Gastroenterology;  Laterality: N/A;  . SPHINCTEROTOMY  04/01/2018   Procedure: SPHINCTEROTOMY;  Surgeon: Rush Landmark Telford Nab., MD;  Location: [place];  Service: Gastroenterology;;  . Bess Kinds CHOLANGIOSCOPY N/A 11/28/2018   Procedure: [id number] CHOLANGIOSCOPY;  Surgeon: Irving Copas., MD;  Location: [place];  Service: Gastroenterology;  Laterality: N/A;  . STENT REMOVAL  09/12/2018   Procedure: STENT REMOVAL;  Surgeon: Irving Copas., MD;  Location: [place];  Service: Gastroenterology;;  . Lavell Islam REMOVAL  11/28/2018   Procedure: STENT REMOVAL;  Surgeon: Irving Copas., MD;  Location: [place];  Service: Gastroenterology;;  . Lia Foyer LIFTING INJECTION  09/12/2018   Procedure: SUBMUCOSAL LIFTING INJECTION;  Surgeon: Irving Copas., MD;  Location: [facility] ENDOSCOPY;  Service: Gastroenterology;;    Family History  Problem Relation Age of Onset  . Melanoma Mother   . Stroke Father   . Hypertension Father   . Coronary artery disease Other   . Colon cancer Neg Hx   . Esophageal cancer Neg Hx   . Stomach cancer Neg Hx   . Rectal cancer Neg Hx   . Pancreatic cancer Neg Hx   . Liver disease Neg Hx   . Inflammatory bowel disease  Neg Hx     SOCIAL HX:   reports that he quit smoking about 42 years ago. His smoking use included cigarettes. He has a 2.50 pack-year smoking history. He has never used smokeless tobacco. He reports that he does not drink alcohol or use drugs.   Current Outpatient Medications:  .  acetaminophen (TYLENOL) 325 MG tablet,  Take 650 mg by mouth every 6 (six) hours as needed for mild pain., Disp: , Rfl:  .  amLODipine (NORVASC) 2.5 MG tablet, Take 2 tablets (5 mg total) by mouth daily., Disp: 60 tablet, Rfl: 2 .  aspirin EC 81 MG tablet, Take 81 mg by mouth at bedtime., Disp: , Rfl:  .  BYSTOLIC 10 MG tablet, Take 10 mg by mouth daily., Disp: , Rfl:  .  clopidogrel (PLAVIX) 75 MG tablet, Take 1 tablet (75 mg total) by mouth daily with breakfast., Disp: 30 tablet, Rfl: 2 .  cyanocobalamin (,VITAMIN B-12,) 1000 MCG/ML injection, INJECT 1ML INTO THE MUSCLE FOR 1 DOSE, Disp: 6 mL, Rfl: 1 .  dicyclomine (BENTYL) 10 MG capsule, Take 1 capsule (10 mg total) by mouth every 8 (eight) hours as needed for spasms., Disp: 3060 capsule, Rfl: 1 .  docusate sodium (COLACE) 100 MG capsule, Take 1 capsule (100 mg total) by mouth 2 (two) times daily. OK to decrease to once daily or discontinue if having diarrhea (Patient not taking: Reported on 01/09/2019), Disp: 30 capsule, Rfl: 0 .  ezetimibe (ZETIA) 10 MG tablet, Take 1 tablet (10 mg total) by mouth daily., Disp: 90 tablet, Rfl: 1 .  Fiber Complete TABS, Take 1 tablet by mouth daily., Disp: , Rfl:  .  fluticasone (FLONASE) 50 MCG/ACT nasal spray, Place 1-2 sprays into both nostrils at bedtime as needed for allergies or rhinitis., Disp: , Rfl:  .  folic acid (FOLVITE) A999333 MCG tablet, Take 400 mcg by mouth daily.  , Disp: , Rfl:  .  isosorbide mononitrate (IMDUR) 60 MG 24 hr tablet, Take 1 tablet (60 mg total) by mouth daily., Disp: 90 tablet, Rfl: 1 .  Menthol, Topical Analgesic, (BENGAY EX), Apply 1 application topically daily as needed (pain)., Disp: , Rfl:  .  Multiple Vitamins-Minerals (MULTIVITAMIN WITH MINERALS) tablet, Take 1 tablet by mouth daily., Disp: , Rfl:  .  Naphazoline-Pheniramine (OPCON-A) 0.027-0.315 % SOLN, Place 1 drop into both eyes daily as needed (itching eyes)., Disp: , Rfl:  .  NEEDLE, DISP, 25 G (B-D DISP NEEDLE 25GX1") 25G X 1" MISC, Inject 1000 mcg into muscle  once a month., Disp: 50 each, Rfl: 0 .  nitroGLYCERIN (NITROSTAT) 0.4 MG SL tablet, Place 1 tablet (0.4 mg total) under the tongue every 5 (five) minutes as needed for chest pain., Disp: 30 tablet, Rfl: 3 .  omeprazole (PRILOSEC) 40 MG capsule, Take 1 capsule (40 mg total) by mouth 2 (two) times daily after a meal., Disp: 90 capsule, Rfl: 3 .  oxyCODONE-acetaminophen (PERCOCET) 10-325 MG tablet, Take 1 tablet by mouth every 12 (twelve) hours as needed for up to 5 days for pain., Disp: 60 tablet, Rfl: 0 .  oxyCODONE-acetaminophen (PERCOCET) 10-325 MG tablet, Take 1 tablet by mouth every 12 (twelve) hours as needed for up to 5 days for pain., Disp: 60 tablet, Rfl: 0 .  oxyCODONE-acetaminophen (PERCOCET) 10-325 MG tablet, Take 1 tablet by mouth every 12 (twelve) hours as needed for up to 5 days for pain., Disp: 60 tablet, Rfl: 0 .  Probiotic Product (PROBIOTIC ADVANCED PO), Take 1 tablet  by mouth 2 (two) times daily. , Disp: , Rfl:  .  rOPINIRole (REQUIP) 2 MG tablet, TAKE 1 TABLET BY MOUTH AT BEDTIME (Patient taking differently: Take 2 mg by mouth at bedtime. ), Disp: 90 tablet, Rfl: 0 .  rosuvastatin (CRESTOR) 20 MG tablet, Take 20 mg by mouth daily., Disp: , Rfl:  .  saccharomyces boulardii (FLORASTOR) 250 MG capsule, Take 1 capsule (250 mg total) by mouth 2 (two) times daily., Disp: 60 capsule, Rfl: 2 .  venlafaxine (EFFEXOR) 75 MG tablet, Take 1 tablet (75 mg total) by mouth daily., Disp: 90 tablet, Rfl: 1  EXAM:   VITALS per patient if applicable: None reported  GENERAL: alert, oriented, appears well and in no acute distress  HEENT: atraumatic, conjunttiva clear, no obvious abnormalities on inspection of external nose and ears  NECK: normal movements of the head and neck  LUNGS: on inspection no signs of respiratory distress, breathing rate appears normal, no obvious gross increased work of breathing, gasping or wheezing  CV: no obvious cyanosis  MS: moves all visible extremities without  noticeable abnormality  PSYCH/NEURO: pleasant and cooperative, no obvious depression or anxiety, speech and thought processing grossly intact  ASSESSMENT AND PLAN:   Chronic bilateral low back pain without sciatica -He is on oxycodone pain management contract with me. -PDMP has been reviewed, overdose risk score is 160, no red flags. -He was prescribed tramadol after his cholecystectomy but he did not fill this.  B12 deficiency  - Plan: cyanocobalamin (,VITAMIN B-12,) 1000 MCG/ML injection  Diarrhea, unspecified type  -Wonder if gut flora imbalance following antibiotic therapy. -Florastor will be prescribed. -Has follow-up with GI scheduled for later this week.  Non-ST elevated MI/new systolic heart failure -Appears compensated, has follow-up with cardiology next week.     I discussed the assessment and treatment plan with the patient. The patient was provided an opportunity to ask questions and all were answered. The patient agreed with the plan and demonstrated an understanding of the instructions.   The patient was advised to call back or seek an in-person evaluation if the symptoms worsen or if the condition fails to improve as anticipated.    [name], MD  [facility]

## 2019-01-17 NOTE — Telephone Encounter (Signed)
Filled at office visit 01/17/2019.

## 2019-01-18 ENCOUNTER — Ambulatory Visit: Payer: Medicare Other | Admitting: Medical

## 2019-01-19 ENCOUNTER — Ambulatory Visit: Payer: Medicare Other | Admitting: Physician Assistant

## 2019-01-19 ENCOUNTER — Encounter: Payer: Self-pay | Admitting: Physician Assistant

## 2019-01-19 ENCOUNTER — Ambulatory Visit: Payer: Medicare Other | Admitting: Gastroenterology

## 2019-01-19 ENCOUNTER — Encounter (HOSPITAL_COMMUNITY): Payer: Self-pay

## 2019-01-19 ENCOUNTER — Other Ambulatory Visit: Payer: Self-pay

## 2019-01-19 ENCOUNTER — Encounter: Payer: Self-pay | Admitting: Gastroenterology

## 2019-01-19 VITALS — BP 106/58 | HR 78 | Temp 98.0°F | Ht 66.5 in | Wt 174.4 lb

## 2019-01-19 VITALS — BP 142/68 | HR 78 | Temp 97.2°F | Ht 69.0 in | Wt 174.6 lb

## 2019-01-19 DIAGNOSIS — N179 Acute kidney failure, unspecified: Secondary | ICD-10-CM

## 2019-01-19 DIAGNOSIS — I25810 Atherosclerosis of coronary artery bypass graft(s) without angina pectoris: Secondary | ICD-10-CM

## 2019-01-19 DIAGNOSIS — R1319 Other dysphagia: Secondary | ICD-10-CM

## 2019-01-19 DIAGNOSIS — I214 Non-ST elevation (NSTEMI) myocardial infarction: Secondary | ICD-10-CM | POA: Diagnosis not present

## 2019-01-19 DIAGNOSIS — K529 Noninfective gastroenteritis and colitis, unspecified: Secondary | ICD-10-CM | POA: Diagnosis not present

## 2019-01-19 DIAGNOSIS — Z9049 Acquired absence of other specified parts of digestive tract: Secondary | ICD-10-CM

## 2019-01-19 DIAGNOSIS — D649 Anemia, unspecified: Secondary | ICD-10-CM

## 2019-01-19 DIAGNOSIS — Z9889 Other specified postprocedural states: Secondary | ICD-10-CM

## 2019-01-19 DIAGNOSIS — K831 Obstruction of bile duct: Secondary | ICD-10-CM

## 2019-01-19 DIAGNOSIS — E785 Hyperlipidemia, unspecified: Secondary | ICD-10-CM

## 2019-01-19 DIAGNOSIS — I10 Essential (primary) hypertension: Secondary | ICD-10-CM | POA: Diagnosis not present

## 2019-01-19 DIAGNOSIS — I5033 Acute on chronic diastolic (congestive) heart failure: Secondary | ICD-10-CM | POA: Diagnosis not present

## 2019-01-19 DIAGNOSIS — Z7902 Long term (current) use of antithrombotics/antiplatelets: Secondary | ICD-10-CM

## 2019-01-19 DIAGNOSIS — I255 Ischemic cardiomyopathy: Secondary | ICD-10-CM | POA: Diagnosis not present

## 2019-01-19 DIAGNOSIS — R1310 Dysphagia, unspecified: Secondary | ICD-10-CM

## 2019-01-19 DIAGNOSIS — G4733 Obstructive sleep apnea (adult) (pediatric): Secondary | ICD-10-CM

## 2019-01-19 LAB — CBC
Hematocrit: 35.4 % — ABNORMAL LOW (ref 37.5–51.0)
Hemoglobin: 11.5 g/dL — ABNORMAL LOW (ref 13.0–17.7)
MCH: 27.9 pg (ref 26.6–33.0)
MCHC: 32.5 g/dL (ref 31.5–35.7)
MCV: 86 fL (ref 79–97)
Platelets: 229 10*3/uL (ref 150–450)
RBC: 4.12 x10E6/uL — ABNORMAL LOW (ref 4.14–5.80)
RDW: 13.9 % (ref 11.6–15.4)
WBC: 7.8 10*3/uL (ref 3.4–10.8)

## 2019-01-19 LAB — BASIC METABOLIC PANEL
BUN/Creatinine Ratio: 12 (ref 10–24)
BUN: 19 mg/dL (ref 8–27)
CO2: 24 mmol/L (ref 20–29)
Calcium: 9.4 mg/dL (ref 8.6–10.2)
Chloride: 102 mmol/L (ref 96–106)
Creatinine, Ser: 1.56 mg/dL — ABNORMAL HIGH (ref 0.76–1.27)
GFR calc Af Amer: 49 mL/min/{1.73_m2} — ABNORMAL LOW (ref >59)
GFR calc non Af Amer: 43 mL/min/{1.73_m2} — ABNORMAL LOW (ref >59)
Glucose: 175 mg/dL — ABNORMAL HIGH (ref 65–99)
Potassium: 4.1 mmol/L (ref 3.5–5.2)
Sodium: 140 mmol/L (ref 134–144)

## 2019-01-19 MED ORDER — DICYCLOMINE HCL 10 MG PO CAPS: 10.0000 mg | ORAL_CAPSULE | Freq: Three times a day (TID) | ORAL | 2 refills | Status: DC

## 2019-01-19 NOTE — Patient Instructions (Signed)
Medication Instructions:  Your physician recommends that you continue on your current medications as directed. Please refer to the Current Medication list given to you today.  *If you need a refill on your cardiac medications before your next appointment, please call your pharmacy*  Lab Work: Your physician recommends that you return for lab work today: CBC, BMET  If you have labs (blood work) drawn today and your tests are completely normal, you will receive your results only by: [name] MyChart Message (if you have MyChart) OR . A paper copy in the mail If you have any lab test that is abnormal or we need to change your treatment, we will call you to review the results.  Follow-Up: At [facility], you and your health needs are our priority.  As part of our continuing mission to provide you with exceptional heart care, we have created designated Provider Care Teams.  These Care Teams include your primary Cardiologist (physician) and Advanced Practice Providers (APPs -  Physician Assistants and Nurse Practitioners) who all work together to provide you with the care you need, when you need it.  Your next appointment:   2-3 month(s)  The format for your next appointment:   Either In Person or Virtual  Provider:   You may see [name], MD or one of the following Advanced Practice Providers on your designated Care Team:    Almyra Deforest, PA-C  Fabian Sharp, PA-C or   Roby Lofts, [place]

## 2019-01-19 NOTE — Patient Instructions (Addendum)
Pleas wait for atleast 3 weeks before doing SIBOTest due to being on antibiotics recently.   You have been given a testing kit to check for small intestine bacterial overgrowth (SIBO) which is completed by a company named [employer]. Make sure to return your test in the mail using the return mailing label given you along with the kit. Your demographic and insurance information have already been sent to the company and they should be in contact with you over the next week regarding this test. Please keep in mind that you will be getting a call from phone number [phone] or a [phone] number. If you do not hear from them within this time frame, please call our office at [phone].   Stop Fibercon.   Start Metamucil 1-2 times daily.   We have sent the following medications to your pharmacy for you to pick up at your convenience:. Bentyl   Your provider has requested that you go to the basement level for lab work before leaving today. Press "B" on the elevator. The lab is located at the first door on the left as you exit the elevator.  Stool Study Kit   You will also need lab work in Feb 2021. Our office will call to remind you.   You will also need ERCP in May 2021. Our office will contact you at a later time to schedule.   You will be contaced by our office prior to your procedure(ERCP) for directions on holding your Coumadin/Warfarin.  If you do not hear from our office 1 week prior to your scheduled procedure, please call [phone] to discuss.  Thank you for choosing me and [facility].  Dr. Rush Landmark

## 2019-01-19 NOTE — Progress Notes (Signed)
[place] VISIT   Primary Care Provider Isaac Bliss, Rayford Halsted, MD [place] [address] [phone]  Patient Profile: [name] is a 78 y.o. male with a pmh significant for CAD (status post repeat PCI in 12/20), COPD, MDD, hypertension, hyperlipidemia, bladder cancer, sleep apnea, restless leg syndrome, Barrett's esophagus without dysplasia, chronic abdominal pain, recent pancreatitis-gallstone related and now status post cholecystectomy, distal biliary stricture status post ERCP with choledocholithiasis removal and stents in place.  The patient presents to the [facility] for an evaluation and management of problem(s) noted below:  Problem List 1. Biliary stricture   2. History of biliary duct stent placement   3. History of cholecystectomy   4. Esophageal dysphagia   5. Chronic diarrhea   6. Antiplatelet or antithrombotic long-term use     History of Present Illness Please see initial consultation note from Dr. Havery Moros and hospital notes from March 2020 and my recent progress notes for full details of HPI.  Interval History The patient returns with his daughter status post his most recent ERCP in November of this year with results as below.  We referred the patient back to surgery for consideration of cholecystectomy which he underwent earlier this month.  Findings were significant for severe chronic cholecystitis.  He was eventually discharged.  A few days later he we presented with pneumonia and had an NSTEMI and was found to have on cardiac catheterization need for PCI intervention for which a drug-eluting stent was placed and Plavix therapy was initiated.  The patient is very thankful for his gallbladder being out as he has had some improvements in his abdominal discomfort in the midepigastrium/right upper quadrant/back.  He continues to have longstanding issues in regards to chronic loose bowel  movements however.  This has been ongoing for many years.  He recalls 50 years ago while working for a [employer] that the onset of his loose stools and urgency occurred.  He had tried FiberCon without significant improvement.  Patient was just seen by his cardiologist earlier today.  Patient's dysphagia has improved status post his recent dilation.  GI Review of Systems Positive as above Negative for dysphagia, odynophagia, melena, hematochezia   Review of Systems General: Denies fevers/chills/weight loss Cardiovascular: Denies chest pain Pulmonary: Denies shortness of breath Gastroenterological: See HPI Genitourinary: Denies darkened urine Hematological: Denies easy bruising/bleeding Dermatological: Denies jaundice Psychological: Mood is stable   Medications Current Outpatient Medications  Medication Sig Dispense Refill  . acetaminophen (TYLENOL) 325 MG tablet Take 650 mg by mouth every 6 (six) hours as needed for mild pain.    [name] amLODipine (NORVASC) 2.5 MG tablet Take 2 tablets (5 mg total) by mouth daily. 60 tablet 2  . aspirin EC 81 MG tablet Take 81 mg by mouth at bedtime.    [name] BYSTOLIC 10 MG tablet Take 10 mg by mouth daily.    . clopidogrel (PLAVIX) 75 MG tablet Take 1 tablet (75 mg total) by mouth daily with breakfast. 30 tablet 2  . cyanocobalamin (,VITAMIN B-12,) 1000 MCG/ML injection INJECT 1ML INTO THE MUSCLE FOR 1 DOSE 6 mL 1  . dicyclomine (BENTYL) 10 MG capsule Take 1 capsule (10 mg total) by mouth every 8 (eight) hours as needed for spasms. 3060 capsule 1  . docusate sodium (COLACE) 100 MG capsule Take 1 capsule (100 mg total) by mouth 2 (two) times daily. OK to decrease to once daily or discontinue if having diarrhea 30 capsule 0  .  ezetimibe (ZETIA) 10 MG tablet Take 1 tablet (10 mg total) by mouth daily. 90 tablet 1  . Fiber Complete TABS Take 1 tablet by mouth daily.    . fluticasone (FLONASE) 50 MCG/ACT nasal spray Place 1-2 sprays into both nostrils at  bedtime as needed for allergies or rhinitis.    . folic acid (FOLVITE) A999333 MCG tablet Take 400 mcg by mouth daily.      . isosorbide mononitrate (IMDUR) 60 MG 24 hr tablet Take 1 tablet (60 mg total) by mouth daily. 90 tablet 1  . Menthol, Topical Analgesic, (BENGAY EX) Apply 1 application topically daily as needed (pain).    . Multiple Vitamins-Minerals (MULTIVITAMIN WITH MINERALS) tablet Take 1 tablet by mouth daily.    . Naphazoline-Pheniramine (OPCON-A) 0.027-0.315 % SOLN Place 1 drop into both eyes daily as needed (itching eyes).    . NEEDLE, DISP, 25 G (B-D DISP NEEDLE 25GX1") 25G X 1" MISC Inject 1000 mcg into muscle once a month. 50 each 0  . nitroGLYCERIN (NITROSTAT) 0.4 MG SL tablet Place 1 tablet (0.4 mg total) under the tongue every 5 (five) minutes as needed for chest pain. 30 tablet 3  . omeprazole (PRILOSEC) 40 MG capsule Take 1 capsule (40 mg total) by mouth 2 (two) times daily after a meal. 90 capsule 3  . oxyCODONE-acetaminophen (PERCOCET) 10-325 MG tablet Take 1 tablet by mouth every 12 (twelve) hours as needed for up to 5 days for pain. 60 tablet 0  . oxyCODONE-acetaminophen (PERCOCET) 10-325 MG tablet Take 1 tablet by mouth every 12 (twelve) hours as needed for up to 5 days for pain. 60 tablet 0  . oxyCODONE-acetaminophen (PERCOCET) 10-325 MG tablet Take 1 tablet by mouth every 12 (twelve) hours as needed for up to 5 days for pain. 60 tablet 0  . Probiotic Product (PROBIOTIC ADVANCED PO) Take 1 tablet by mouth 2 (two) times daily.     [name] rOPINIRole (REQUIP) 2 MG tablet TAKE 1 TABLET BY MOUTH AT BEDTIME (Patient taking differently: Take 2 mg by mouth at bedtime. ) 90 tablet 0  . rosuvastatin (CRESTOR) 20 MG tablet Take 20 mg by mouth daily.    [name] saccharomyces boulardii (FLORASTOR) 250 MG capsule Take 1 capsule (250 mg total) by mouth 2 (two) times daily. 60 capsule 2  . venlafaxine (EFFEXOR) 75 MG tablet Take 1 tablet (75 mg total) by mouth daily. 90 tablet 1  . dicyclomine  (BENTYL) 10 MG capsule Take 1 capsule (10 mg total) by mouth 4 (four) times daily -  before meals and at bedtime. 90 capsule 2   No current facility-administered medications for this visit.    Allergies Allergies  Allergen Reactions  . Losartan Potassium Other (See Comments)    Hyperkalemia  . Tape Itching and Rash    reddened skin     Histories Past Medical History:  Diagnosis Date  . Allergy   . B12 deficiency anemia   . Blood transfusion without reported diagnosis   . CAD (coronary artery disease)   . Cancer ([place])    bladder-   . Colon polyps   . COPD (chronic obstructive pulmonary disease) ([place])   . Depression   . Esophagus, Barrett's   . GERD (gastroesophageal reflux disease)   . History of bladder cancer    Bladder cancer "8 times"  . History of hiatal hernia   . Hyperlipidemia   . Hypertension   . Localized osteoarthrosis, lower leg   . Restless leg syndrome   .  Sleep apnea    does not wear cpap  . Stenosis of esophagus    Past Surgical History:  Procedure Laterality Date  . BILIARY BRUSHING  04/01/2018   Procedure: BILIARY BRUSHING;  Surgeon: Rush Landmark Telford Nab., MD;  Location: [place];  Service: Gastroenterology;;  . BILIARY BRUSHING  09/12/2018   Procedure: BILIARY BRUSHING;  Surgeon: Irving Copas., MD;  Location: [place];  Service: Gastroenterology;;  . BILIARY BRUSHING  11/28/2018   Procedure: BILIARY BRUSHING;  Surgeon: Irving Copas., MD;  Location: [place];  Service: Gastroenterology;;  . BILIARY DILATION  09/12/2018   Procedure: BILIARY DILATION;  Surgeon: Irving Copas., MD;  Location: [place];  Service: Gastroenterology;;  . BILIARY DILATION  11/28/2018   Procedure: BILIARY DILATION;  Surgeon: Irving Copas., MD;  Location: [place];  Service: Gastroenterology;;  . BILIARY STENT PLACEMENT  04/01/2018   Procedure: BILIARY STENT PLACEMENT;  Surgeon: Irving Copas., MD;  Location:  [place];  Service: Gastroenterology;;  . BILIARY STENT PLACEMENT  09/12/2018   Procedure: BILIARY STENT PLACEMENT;  Surgeon: Irving Copas., MD;  Location: [place];  Service: Gastroenterology;;  . BILIARY STENT PLACEMENT  11/28/2018   Procedure: BILIARY STENT PLACEMENT;  Surgeon: Irving Copas., MD;  Location: [place];  Service: Gastroenterology;;  . BIOPSY  04/01/2018   Procedure: BIOPSY;  Surgeon: Irving Copas., MD;  Location: [place];  Service: Gastroenterology;;  . BIOPSY  09/12/2018   Procedure: BIOPSY;  Surgeon: Irving Copas., MD;  Location: [place];  Service: Gastroenterology;;  . BIOPSY  11/28/2018   Procedure: BIOPSY;  Surgeon: Irving Copas., MD;  Location: [facility] ENDOSCOPY;  Service: Gastroenterology;;  . bladder cancer      x 8 cystoscopy  . CERVICAL DISCECTOMY     ACDF  . COLONOSCOPY  11/17/2005   normal   . CORONARY ARTERY BYPASS GRAFT     x4  . CORONARY STENT INTERVENTION N/A 01/05/2019   Procedure: CORONARY STENT INTERVENTION;  Surgeon: Nelva Bush, MD;  Location: [place] [facility];  Service: Cardiovascular;  Laterality: N/A;  . ENDOSCOPIC MUCOSAL RESECTION  09/12/2018   Procedure: ENDOSCOPIC MUCOSAL RESECTION;  Surgeon: Rush Landmark, Telford Nab., MD;  Location: [facility];  Service: Gastroenterology;;  . ENDOSCOPIC RETROGRADE CHOLANGIOPANCREATOGRAPHY (ERCP) WITH PROPOFOL N/A 04/01/2018   Procedure: ENDOSCOPIC RETROGRADE CHOLANGIOPANCREATOGRAPHY (ERCP) WITH PROPOFOL;  Surgeon: Irving Copas., MD;  Location: [place];  Service: Gastroenterology;  Laterality: N/A;  . ENDOSCOPIC RETROGRADE CHOLANGIOPANCREATOGRAPHY (ERCP) WITH PROPOFOL N/A 09/12/2018   Procedure: ENDOSCOPIC RETROGRADE CHOLANGIOPANCREATOGRAPHY (ERCP) WITH PROPOFOL;  Surgeon: Rush Landmark Telford Nab., MD;  Location: [place];  Service: Gastroenterology;  Laterality: N/A;  . ERCP N/A 11/28/2018   Procedure: ENDOSCOPIC RETROGRADE  CHOLANGIOPANCREATOGRAPHY (ERCP) +EGD with spyglass;  Surgeon: Rush Landmark Telford Nab., MD;  Location: [place];  Service: Gastroenterology;  Laterality: N/A;  . ESOPHAGOGASTRODUODENOSCOPY  04/29/2010  . ESOPHAGOGASTRODUODENOSCOPY (EGD) WITH PROPOFOL N/A 04/01/2018   Procedure: ESOPHAGOGASTRODUODENOSCOPY (EGD) WITH PROPOFOL;  Surgeon: Rush Landmark Telford Nab., MD;  Location: [place];  Service: Gastroenterology;  Laterality: N/A;  . ESOPHAGOGASTRODUODENOSCOPY (EGD) WITH PROPOFOL N/A 09/12/2018   Procedure: ESOPHAGOGASTRODUODENOSCOPY (EGD) WITH PROPOFOL;  Surgeon: Rush Landmark Telford Nab., MD;  Location: [place];  Service: Gastroenterology;  Laterality: N/A;  . ESOPHAGOGASTRODUODENOSCOPY (EGD) WITH PROPOFOL N/A 11/28/2018   Procedure: ESOPHAGOGASTRODUODENOSCOPY (EGD) WITH PROPOFOL;  Surgeon: Rush Landmark Telford Nab., MD;  Location: [place];  Service: Gastroenterology;  Laterality: N/A;  . EUS  04/01/2018   Procedure: FULL UPPER ENDOSCOPIC ULTRASOUND (EUS) RADIAL;  Surgeon: Irving Copas., MD;  Location: [place];  Service: Gastroenterology;;  . EUS N/A 09/12/2018   Procedure: UPPER ENDOSCOPIC ULTRASOUND (EUS) RADIAL;  Surgeon: Irving Copas., MD;  Location: [place];  Service: Gastroenterology;  Laterality: N/A;  . FINE NEEDLE ASPIRATION  09/12/2018   Procedure: FINE NEEDLE ASPIRATION (FNA) LINEAR;  Surgeon: Irving Copas., MD;  Location: [place];  Service: Gastroenterology;;  . HEMOSTASIS CLIP PLACEMENT  09/12/2018   Procedure: HEMOSTASIS CLIP PLACEMENT;  Surgeon: Irving Copas., MD;  Location: [facility] ENDOSCOPY;  Service: Gastroenterology;;  . I & D EXTREMITY Left 11/24/2016   Procedure: IRRIGATION AND DEBRIDEMENT LEFT HAND, THUMB, INDEX, MIDDLE, RING, AND SMALL FINGERS WITH RECONSTRUCTION;  Surgeon: Roseanne Kaufman, MD;  Location: [place];  Service: Orthopedics;  Laterality: Left;  . KNEE ARTHROSCOPY Left   . LEFT HEART CATH AND CORS/GRAFTS  ANGIOGRAPHY N/A 01/05/2019   Procedure: LEFT HEART CATH AND CORS/GRAFTS ANGIOGRAPHY;  Surgeon: Nelva Bush, MD;  Location: [place] [facility];  Service: Cardiovascular;  Laterality: N/A;  . LUMBAR LAMINECTOMY     and fusion x 2  . NASAL SINUS SURGERY    . POPLITEAL SYNOVIAL CYST EXCISION    . REMOVAL OF STONES  04/01/2018   Procedure: REMOVAL OF STONES;  Surgeon: Rush Landmark Telford Nab., MD;  Location: [place];  Service: Gastroenterology;;  . REMOVAL OF STONES  09/12/2018   Procedure: REMOVAL OF STONES;  Surgeon: Irving Copas., MD;  Location: [place];  Service: Gastroenterology;;  . REMOVAL OF STONES  11/28/2018   Procedure: REMOVAL OF STONES;  Surgeon: Irving Copas., MD;  Location: [place];  Service: Gastroenterology;;  . Azzie Almas DILATION N/A 09/12/2018   Procedure: Azzie Almas DILATION;  Surgeon: Irving Copas., MD;  Location: [place];  Service: Gastroenterology;  Laterality: N/A;  . SAVORY DILATION N/A 11/28/2018   Procedure: SAVORY DILATION;  Surgeon: Rush Landmark Telford Nab., MD;  Location: [place];  Service: Gastroenterology;  Laterality: N/A;  . SPHINCTEROTOMY  04/01/2018   Procedure: SPHINCTEROTOMY;  Surgeon: Rush Landmark Telford Nab., MD;  Location: [place];  Service: Gastroenterology;;  . Bess Kinds CHOLANGIOSCOPY N/A 11/28/2018   Procedure: [id number] CHOLANGIOSCOPY;  Surgeon: Irving Copas., MD;  Location: [place];  Service: Gastroenterology;  Laterality: N/A;  . STENT REMOVAL  09/12/2018   Procedure: STENT REMOVAL;  Surgeon: Irving Copas., MD;  Location: [place];  Service: Gastroenterology;;  . Lavell Islam REMOVAL  11/28/2018   Procedure: STENT REMOVAL;  Surgeon: Irving Copas., MD;  Location: [place];  Service: Gastroenterology;;  . Lia Foyer LIFTING INJECTION  09/12/2018   Procedure: SUBMUCOSAL LIFTING INJECTION;  Surgeon: Irving Copas., MD;  Location: [place];  Service:  Gastroenterology;;   Social History   Socioeconomic History  . Marital status: Married    Spouse name: Not on file  . Number of children: Not on file  . Years of education: Not on file  . Highest education level: Not on file  Occupational History  . Not on file  Tobacco Use  . Smoking status: Former Smoker    Packs/day: 0.50    Years: 5.00    Pack years: 2.50    Types: Cigarettes    Quit date: 03/30/1976    Years since quitting: 42.8  . Smokeless tobacco: Never Used  Substance and Sexual Activity  . Alcohol use: No    Alcohol/week: 0.0 standard drinks  . Drug use: No  . Sexual activity: Yes  Other Topics Concern  . Not on file  Social History Narrative  . Not on file   Social Determinants of Health   Financial Resource Strain:   . Difficulty of Paying Living Expenses: Not on file  Food Insecurity:   . Worried About Charity fundraiser in the Last Year: Not on file  . Ran Out of Food in the Last Year: Not on file  Transportation Needs:   . Lack of Transportation (Medical): Not on file  . Lack of Transportation (Non-Medical): Not on file  Physical Activity:   . Days of Exercise per Week: Not on file  . Minutes of Exercise per Session: Not on file  Stress:   . Feeling of Stress : Not on file  Social Connections:   . Frequency of Communication with Friends and Family: Not on file  . Frequency of Social Gatherings with Friends and Family: Not on file  . Attends Religious Services: Not on file  . Active Member of Clubs or Organizations: Not on file  . Attends Archivist Meetings: Not on file  . Marital Status: Not on file  Intimate Partner Violence:   . Fear of Current or Ex-Partner: Not on file  . Emotionally Abused: Not on file  . Physically Abused: Not on file  . Sexually Abused: Not on file   Family History  Problem Relation Age of Onset  . Melanoma Mother   . Stroke Father   . Hypertension Father   . Coronary artery disease Other   . Colon  cancer Neg Hx   . Esophageal cancer Neg Hx   . Stomach cancer Neg Hx   . Rectal cancer Neg Hx   . Pancreatic cancer Neg Hx   . Liver disease Neg Hx   . Inflammatory bowel disease Neg Hx    I have reviewed his medical, social, and family history in detail and updated the electronic medical record as necessary.    PHYSICAL EXAMINATION  BP (!) 106/58 (BP Location: Left Arm, Patient Position: Sitting, Cuff Size: Normal)   Pulse 78   Temp 98 F (36.7 C)   Ht 5' 6.5" (1.689 m) Comment: height measured without shoes  Wt 174 lb 6 oz (79.1 kg)   BMI 27.72 kg/m  GEN: NAD, appears stated age, doesn't appear chronically ill, accompanied by daughter PSYCH: Cooperative, without pressured speech EYE: Conjunctivae pink, sclerae anicteric ENT: MMM CV: RR without R/Gs  RESP: CTAB posteriorly, without wheezing GI: NABS, soft, protuberant, mild tenderness to palpation in the lower abdomen, ND, without rebound or guarding MSK/EXT: Trace bilateral lower extremity edema SKIN: No jaundice NEURO:  Alert & Oriented x 3, no focal deficits   REVIEW OF DATA  I reviewed the following data at the time of this encounter:  GI Procedures and Studies  November 2020 ERCP - Salmon-colored mucosal islands suspicious for short-segment Barrett's esophagus - biopsied. - Z-line irregular, 41 cm from the incisors. - Dilation performed in the entire esophagus. - No gross lesions in the stomach. - No gross lesions in the duodenal bulb, in the first portion of the duodenum and in the second portion of the duodenum. - Prior biliary sphincterotomy appeared open. - Two visibly patent stents from the biliary tree were seen in the major papilla. These were removed and sent for cytology. - The fluoroscopic examination was suspicious for sludge/debris. - A single localized biliary stricture was found in the lower third of the main bile duct. The stricture was indeterminate. This was dilated. This was brushed for  cytology. -  Choledocholithiasis and biliary sludge was found. Complete removal was accomplished by sweeping on multiple occasions. Occlusion cholangiogram showed a clear duct at end of procedure. - Moderately narrowed lumen in the biliary tract visualized via SpyGlass. This was biopsied. - Three plastic biliary stents were placed into the common bile duct to aid in dilation of the narrowing. - Overall, suspect this is more an inflammatory stricture/stenosis from stone disease. The consideration of an IgG4 mediated process remains. However, there is no doubt that the patient continues to drop stones/sludge from the gallbladder into the duct as evidenced on last 2 ERCPs of significant stone/debris being removed.  Pathology Specimen Submitted: A. COMMON BILE DUCT, REMOVED STENTS X2:  FINAL MICROSCOPIC DIAGNOSIS:  - No malignant cells identified  - Benign reactive/reparative changes   Specimen Submitted: A. COMMON BILE DUCT, BRUSHING:  FINAL MICROSCOPIC DIAGNOSIS:  - Atypical cells present  ADDENDUM:  The atypia observed is favored to be reactive in nature.   FINAL MICROSCOPIC DIAGNOSIS:  A. COMMON BILE DUCT, DISTAL, BIOPSY:  - Glandular mucosa, eroded and inflamed with granulation tissue and  reactive changes.  - Negative for malignancy.  B. ESOPHAGUS, 40 CM, BIOPSY:  - Squamocolumnar esophageal mucosa with reactive/regenerative changes.  - Negative for intestinal metaplasia (goblet cell metaplasia).   Laboratory Studies  Reviewed in epic  Imaging Studies  No new abdominal imaging   ASSESSMENT  [name] is a 78 y.o. male with a pmh significant for CAD (status post repeat PCI in 12/20), COPD, MDD, hypertension, hyperlipidemia, bladder cancer, sleep apnea, restless leg syndrome, Barrett's esophagus without dysplasia, chronic abdominal pain, recent pancreatitis-gallstone related and now status post cholecystectomy, distal biliary stricture status post ERCP with  choledocholithiasis removal and stents in place.  The patient is seen today for evaluation and management of:  1. Biliary stricture   2. History of biliary duct stent placement   3. History of cholecystectomy   4. Esophageal dysphagia   5. Chronic diarrhea   6. Antiplatelet or antithrombotic long-term use    Overall, the patient is hemodynamically and clinically stable.  The patient is very appreciative for his gallbladder being out, he feels that there has been some significant improvement in some of the issues that he has been dealing with for years.  He still continues to have loose stools as well as fecal urgency for which he is hopeful we can understand the reasoning.  More than 50% the time when he finally defecates he has improvement in his symptoms.  I suspect that his chronic diarrheal symptoms are more likely functional in origin.  The etiology is most likely irritable bowel syndrome.  We may consider the role of flexible sigmoidoscopy in the future just to quickly get biopsies to ensure he does not have microscopic/collagenous colitis.  FiberCon has not been helpful.  We will try to bulk him with Metamucil if this does not work he will stop that.  I think a TCA would be helpful however he is already on an SNRI we have to work with his PCP before trialing that.  I would like the patient to initiate Bentyl sparingly since he has a history of urinary issues and is post bladder cancer multiple times.  His dysphagia is improved with recent dilation.  I still believe that his biliary stricture is most likely inflammatory related based on all his prior imaging.  I will plan to repeat an ERCP with hopeful stent removal and see how he does.  If there remains a narrowing  then I plan to repeat brushings and place a fully covered self-expanding metal stent and effort of trying to keep things open as much as possible.  We did discuss the previous elevation in his IgG4.  This mid could be a cause for  stricturing disease though in a single area it is rare for [place] to see IgG4 cholangiopathy but we have to keep that in mind.  Since he has had a recent PCI intervention and is just getting over pneumonia I do not think steroids would be helpful for [place] at this time.  We will consider the role of steroids in the future.  We will need to get permission/approval from his cardiology provider to ensure that he can come off Plavix safely for his next ERCP.  I am willing because he has 3 stents in place to push his ERCP out to May or June as long as liver tests are doing well.  We plan to recheck those in February.  He is getting labs done by his primary doctor in a few weeks and he will have those labs sent to [place] once they are completed.  With all of the work that we have done I think we have effectively ruled out a malignancy but we have to always keep that in the back of her mind especially if the stricture persists.  The risks and benefits of endoscopic evaluation were discussed with the patient; these include but are not limited to the risk of perforation, infection, bleeding, missed lesions, lack of diagnosis, severe illness requiring hospitalization, as well as anesthesia and sedation related illnesses.  The patient is agreeable to proceed.    PLAN  Fecal elastase to be obtained SIBO breath testing to be performed (he will not be on any antibiotics or recent antibiotics for at least 3 weeks prior to the breath test) Initiate Bentyl 2-4 times daily Repeat ERCP with stent removal and potential repeat brushings or replacement of fully covered self-expanding metal stent in May or June of this year (will need to get cardiology approval to come off Plavix) We will consider TCA in future with PCP approval if okay since he is already on an SNRI Consider role of a flexible sigmoidoscopy to rule out microscopic/collagenous colitis At some point will consider repeat pancreas protocol CT versus MRI/MRCP to evaluate  pancreas further from a cross-sectional perspective   Orders Placed This Encounter  Procedures  . Pancreatic Elastase, Fecal    New Prescriptions   DICYCLOMINE (BENTYL) 10 MG CAPSULE    Take 1 capsule (10 mg total) by mouth 4 (four) times daily -  before meals and at bedtime.   Modified Medications   No medications on file    Planned Follow Up No follow-ups on file.   Justice Britain, MD [facility] # [phone]

## 2019-01-19 NOTE — Progress Notes (Signed)
Cardiology Office Note:    Date:  01/21/2019   ID:  [name], [name] 28-Aug-1940, MRN [id number]  PCP:  Isaac Bliss, Rayford Halsted, MD  Cardiologist:  [name], MD  Electrophysiologist:  None   Referring MD: Isaac Bliss, [place]*   Chief Complaint  Patient presents with  . Hospitalization Follow-up    seen for Dr. Claiborne Billings.     History of Present Illness:    [name] is a 78 y.o. male with a hx of CAD s/p CABG with LIMA-LAD, SVG-marginal, SVG-RCA in 2001, mild emphysema, hypertension, hyperlipidemia, and history of obstructive sleep apnea not on CPAP therapy.  Echocardiogram in August 2012 showed EF of 40 to 45% with moderate septal hypokinesis, grade 1 DD, aortic valve sclerosis.  Last repeat echocardiogram obtained in December 2019 showed EF 60 to 65%, grade 1 DD.  Myoview study obtained on 01/04/2018 was low risk and demonstrated normal perfusion without scar or ischemia, EF 51%.  He was admitted in March 2021 with sepsis and colitis.  MRCP demonstrated acute pancreatitis, mild gallbladder distention and peri-cholecystic fluid. He eventually underwent robotic cholecystectomy by Dr. Windle Guard on 01/02/2019.  After his procedure, he developed a bilateral pneumonia and shortness of breath with increasing troponin.  CT of the chest was negative for PE.  Patient was treated with IV Lasix and antibiotic.  Given elevated troponin, patient underwent cardiac catheterization on 01/05/2019 which showed 99% subtotally occluded SVG-RPDA/RPL.  This was treated with 3 overlapping resolute Onyx drug-eluting stents.  Postprocedure, it was recommended to continue aspirin and Plavix indefinitely.  If patient later this develop refractory symptoms, may consider PCI of ostial left main, left circumflex and OM1.  Echocardiogram obtained on the same day showed EF 40 to 45%, severe hypokinesis of the entire inferior and inferoseptal wall, grade 1 DD, mild MR, cannot exclude ASD/PFO.  Patient presents  today for cardiology office visit.  He continued to have fatigue however gradually improving.  He previously had a epigastric pain which he attributed to acid reflux, now he think about it and may have been angina.  He says that her epigastric pain has completely resolved since the hospitalization.  He is walking at home multiple times throughout the day without any issue.  I encouraged him to increase activity level.  EKG does show worsening T wave inversion in the inferior leads and new T wave inversion in the lateral leads.  I discussed this with my colleague we think this is the evolutionary changes.  Patient is completely asymptomatic therefore would not proceed with any further ischemic work-up.  I recommended followup in 2 to 3 months.  Otherwise he has no lower extremity edema, orthopnea or PND.   Past Medical History:  Diagnosis Date  . Allergy   . B12 deficiency anemia   . Blood transfusion without reported diagnosis   . CAD (coronary artery disease)   . Cancer ([place])    bladder-   . Colon polyps   . COPD (chronic obstructive pulmonary disease) ([place])   . Depression   . Esophagus, Barrett's   . GERD (gastroesophageal reflux disease)   . History of bladder cancer    Bladder cancer "8 times"  . History of hiatal hernia   . Hyperlipidemia   . Hypertension   . Localized osteoarthrosis, lower leg   . Restless leg syndrome   . Sleep apnea    does not wear cpap  . Stenosis of esophagus     Past Surgical History:  Procedure Laterality Date  . BILIARY BRUSHING  04/01/2018   Procedure: BILIARY BRUSHING;  Surgeon: Rush Landmark Telford Nab., MD;  Location: [place];  Service: Gastroenterology;;  . BILIARY BRUSHING  09/12/2018   Procedure: BILIARY BRUSHING;  Surgeon: Irving Copas., MD;  Location: [place];  Service: Gastroenterology;;  . BILIARY BRUSHING  11/28/2018   Procedure: BILIARY BRUSHING;  Surgeon: Irving Copas., MD;  Location: [place];  Service:  Gastroenterology;;  . BILIARY DILATION  09/12/2018   Procedure: BILIARY DILATION;  Surgeon: Irving Copas., MD;  Location: [place];  Service: Gastroenterology;;  . BILIARY DILATION  11/28/2018   Procedure: BILIARY DILATION;  Surgeon: Irving Copas., MD;  Location: [place];  Service: Gastroenterology;;  . BILIARY STENT PLACEMENT  04/01/2018   Procedure: BILIARY STENT PLACEMENT;  Surgeon: Irving Copas., MD;  Location: [place];  Service: Gastroenterology;;  . BILIARY STENT PLACEMENT  09/12/2018   Procedure: BILIARY STENT PLACEMENT;  Surgeon: Irving Copas., MD;  Location: [place];  Service: Gastroenterology;;  . BILIARY STENT PLACEMENT  11/28/2018   Procedure: BILIARY STENT PLACEMENT;  Surgeon: Irving Copas., MD;  Location: [place];  Service: Gastroenterology;;  . BIOPSY  04/01/2018   Procedure: BIOPSY;  Surgeon: Irving Copas., MD;  Location: [place];  Service: Gastroenterology;;  . BIOPSY  09/12/2018   Procedure: BIOPSY;  Surgeon: Irving Copas., MD;  Location: [place];  Service: Gastroenterology;;  . BIOPSY  11/28/2018   Procedure: BIOPSY;  Surgeon: Irving Copas., MD;  Location: [facility] ENDOSCOPY;  Service: Gastroenterology;;  . bladder cancer      x 8 cystoscopy  . CERVICAL DISCECTOMY     ACDF  . COLONOSCOPY  11/17/2005   normal   . CORONARY ARTERY BYPASS GRAFT     x4  . CORONARY STENT INTERVENTION N/A 01/05/2019   Procedure: CORONARY STENT INTERVENTION;  Surgeon: Nelva Bush, MD;  Location: [place] CV [facility];  Service: Cardiovascular;  Laterality: N/A;  . ENDOSCOPIC MUCOSAL RESECTION  09/12/2018   Procedure: ENDOSCOPIC MUCOSAL RESECTION;  Surgeon: Rush Landmark, Telford Nab., MD;  Location: [facility] ENDOSCOPY;  Service: Gastroenterology;;  . ENDOSCOPIC RETROGRADE CHOLANGIOPANCREATOGRAPHY (ERCP) WITH PROPOFOL N/A 04/01/2018   Procedure: ENDOSCOPIC RETROGRADE CHOLANGIOPANCREATOGRAPHY (ERCP) WITH  PROPOFOL;  Surgeon: Irving Copas., MD;  Location: [place];  Service: Gastroenterology;  Laterality: N/A;  . ENDOSCOPIC RETROGRADE CHOLANGIOPANCREATOGRAPHY (ERCP) WITH PROPOFOL N/A 09/12/2018   Procedure: ENDOSCOPIC RETROGRADE CHOLANGIOPANCREATOGRAPHY (ERCP) WITH PROPOFOL;  Surgeon: Rush Landmark Telford Nab., MD;  Location: [place];  Service: Gastroenterology;  Laterality: N/A;  . ERCP N/A 11/28/2018   Procedure: ENDOSCOPIC RETROGRADE CHOLANGIOPANCREATOGRAPHY (ERCP) +EGD with spyglass;  Surgeon: Rush Landmark Telford Nab., MD;  Location: [place];  Service: Gastroenterology;  Laterality: N/A;  . ESOPHAGOGASTRODUODENOSCOPY  04/29/2010  . ESOPHAGOGASTRODUODENOSCOPY (EGD) WITH PROPOFOL N/A 04/01/2018   Procedure: ESOPHAGOGASTRODUODENOSCOPY (EGD) WITH PROPOFOL;  Surgeon: Rush Landmark Telford Nab., MD;  Location: [place];  Service: Gastroenterology;  Laterality: N/A;  . ESOPHAGOGASTRODUODENOSCOPY (EGD) WITH PROPOFOL N/A 09/12/2018   Procedure: ESOPHAGOGASTRODUODENOSCOPY (EGD) WITH PROPOFOL;  Surgeon: Rush Landmark Telford Nab., MD;  Location: [place];  Service: Gastroenterology;  Laterality: N/A;  . ESOPHAGOGASTRODUODENOSCOPY (EGD) WITH PROPOFOL N/A 11/28/2018   Procedure: ESOPHAGOGASTRODUODENOSCOPY (EGD) WITH PROPOFOL;  Surgeon: Rush Landmark Telford Nab., MD;  Location: [place];  Service: Gastroenterology;  Laterality: N/A;  . EUS  04/01/2018   Procedure: FULL UPPER ENDOSCOPIC ULTRASOUND (EUS) RADIAL;  Surgeon: Irving Copas., MD;  Location: [place];  Service: Gastroenterology;;  . EUS N/A 09/12/2018   Procedure:  UPPER ENDOSCOPIC ULTRASOUND (EUS) RADIAL;  Surgeon: Rush Landmark Telford Nab., MD;  Location: [place];  Service: Gastroenterology;  Laterality: N/A;  . FINE NEEDLE ASPIRATION  09/12/2018   Procedure: FINE NEEDLE ASPIRATION (FNA) LINEAR;  Surgeon: Irving Copas., MD;  Location: [place];  Service: Gastroenterology;;  . HEMOSTASIS CLIP PLACEMENT   09/12/2018   Procedure: HEMOSTASIS CLIP PLACEMENT;  Surgeon: Irving Copas., MD;  Location: [facility] ENDOSCOPY;  Service: Gastroenterology;;  . I & D EXTREMITY Left 11/24/2016   Procedure: IRRIGATION AND DEBRIDEMENT LEFT HAND, THUMB, INDEX, MIDDLE, RING, AND SMALL FINGERS WITH RECONSTRUCTION;  Surgeon: Roseanne Kaufman, MD;  Location: [place];  Service: Orthopedics;  Laterality: Left;  . KNEE ARTHROSCOPY Left   . LEFT HEART CATH AND CORS/GRAFTS ANGIOGRAPHY N/A 01/05/2019   Procedure: LEFT HEART CATH AND CORS/GRAFTS ANGIOGRAPHY;  Surgeon: Nelva Bush, MD;  Location: [place] CV LAB;  Service: Cardiovascular;  Laterality: N/A;  . LUMBAR LAMINECTOMY     and fusion x 2  . NASAL SINUS SURGERY    . POPLITEAL SYNOVIAL CYST EXCISION    . REMOVAL OF STONES  04/01/2018   Procedure: REMOVAL OF STONES;  Surgeon: Rush Landmark Telford Nab., MD;  Location: [place];  Service: Gastroenterology;;  . REMOVAL OF STONES  09/12/2018   Procedure: REMOVAL OF STONES;  Surgeon: Irving Copas., MD;  Location: [place];  Service: Gastroenterology;;  . REMOVAL OF STONES  11/28/2018   Procedure: REMOVAL OF STONES;  Surgeon: Irving Copas., MD;  Location: [place];  Service: Gastroenterology;;  . Azzie Almas DILATION N/A 09/12/2018   Procedure: Azzie Almas DILATION;  Surgeon: Irving Copas., MD;  Location: [place];  Service: Gastroenterology;  Laterality: N/A;  . SAVORY DILATION N/A 11/28/2018   Procedure: SAVORY DILATION;  Surgeon: Rush Landmark Telford Nab., MD;  Location: [place];  Service: Gastroenterology;  Laterality: N/A;  . SPHINCTEROTOMY  04/01/2018   Procedure: SPHINCTEROTOMY;  Surgeon: Rush Landmark Telford Nab., MD;  Location: [place];  Service: Gastroenterology;;  . Bess Kinds CHOLANGIOSCOPY N/A 11/28/2018   Procedure: [id number] CHOLANGIOSCOPY;  Surgeon: Irving Copas., MD;  Location: [place];  Service: Gastroenterology;  Laterality: N/A;  . STENT REMOVAL   09/12/2018   Procedure: STENT REMOVAL;  Surgeon: Irving Copas., MD;  Location: [place];  Service: Gastroenterology;;  . Lavell Islam REMOVAL  11/28/2018   Procedure: STENT REMOVAL;  Surgeon: Irving Copas., MD;  Location: [place];  Service: Gastroenterology;;  . Lia Foyer LIFTING INJECTION  09/12/2018   Procedure: SUBMUCOSAL LIFTING INJECTION;  Surgeon: Irving Copas., MD;  Location: [place];  Service: Gastroenterology;;    Current Medications: Current Meds  Medication Sig  . acetaminophen (TYLENOL) 325 MG tablet Take 650 mg by mouth every 6 (six) hours as needed for mild pain.  [name] amLODipine (NORVASC) 2.5 MG tablet Take 2 tablets (5 mg total) by mouth daily.  [name] aspirin EC 81 MG tablet Take 81 mg by mouth at bedtime.  [name] BYSTOLIC 10 MG tablet Take 10 mg by mouth daily.  . clopidogrel (PLAVIX) 75 MG tablet Take 1 tablet (75 mg total) by mouth daily with breakfast.  . cyanocobalamin (,VITAMIN B-12,) 1000 MCG/ML injection INJECT 1ML INTO THE MUSCLE FOR 1 DOSE  . dicyclomine (BENTYL) 10 MG capsule Take 1 capsule (10 mg total) by mouth every 8 (eight) hours as needed for spasms.  [name] docusate sodium (COLACE) 100 MG capsule Take 1 capsule (100 mg total) by mouth 2 (two) times daily. OK to decrease to once daily or discontinue if  having diarrhea  . ezetimibe (ZETIA) 10 MG tablet Take 1 tablet (10 mg total) by mouth daily.  . Fiber Complete TABS Take 1 tablet by mouth daily.  . fluticasone (FLONASE) 50 MCG/ACT nasal spray Place 1-2 sprays into both nostrils at bedtime as needed for allergies or rhinitis.  . folic acid (FOLVITE) A999333 MCG tablet Take 400 mcg by mouth daily.    . isosorbide mononitrate (IMDUR) 60 MG 24 hr tablet Take 1 tablet (60 mg total) by mouth daily.  . Menthol, Topical Analgesic, (BENGAY EX) Apply 1 application topically daily as needed (pain).  . Multiple Vitamins-Minerals (MULTIVITAMIN WITH MINERALS) tablet Take 1 tablet by mouth daily.  .  Naphazoline-Pheniramine (OPCON-A) 0.027-0.315 % SOLN Place 1 drop into both eyes daily as needed (itching eyes).  . NEEDLE, DISP, 25 G (B-D DISP NEEDLE 25GX1") 25G X 1" MISC Inject 1000 mcg into muscle once a month.  . nitroGLYCERIN (NITROSTAT) 0.4 MG SL tablet Place 1 tablet (0.4 mg total) under the tongue every 5 (five) minutes as needed for chest pain.  [name] omeprazole (PRILOSEC) 40 MG capsule Take 1 capsule (40 mg total) by mouth 2 (two) times daily after a meal.  . oxyCODONE-acetaminophen (PERCOCET) 10-325 MG tablet Take 1 tablet by mouth every 12 (twelve) hours as needed for up to 5 days for pain.  [name] oxyCODONE-acetaminophen (PERCOCET) 10-325 MG tablet Take 1 tablet by mouth every 12 (twelve) hours as needed for up to 5 days for pain.  [name] oxyCODONE-acetaminophen (PERCOCET) 10-325 MG tablet Take 1 tablet by mouth every 12 (twelve) hours as needed for up to 5 days for pain.  . Probiotic Product (PROBIOTIC ADVANCED PO) Take 1 tablet by mouth 2 (two) times daily.   [name] rOPINIRole (REQUIP) 2 MG tablet TAKE 1 TABLET BY MOUTH AT BEDTIME (Patient taking differently: Take 2 mg by mouth at bedtime. )  . rosuvastatin (CRESTOR) 20 MG tablet Take 20 mg by mouth daily.  [name] saccharomyces boulardii (FLORASTOR) 250 MG capsule Take 1 capsule (250 mg total) by mouth 2 (two) times daily.  [name] venlafaxine (EFFEXOR) 75 MG tablet Take 1 tablet (75 mg total) by mouth daily.     Allergies:   Losartan potassium and Tape   Social History   Socioeconomic History  . Marital status: Married    Spouse name: Not on file  . Number of children: Not on file  . Years of education: Not on file  . Highest education level: Not on file  Occupational History  . Not on file  Tobacco Use  . Smoking status: Former Smoker    Packs/day: 0.50    Years: 5.00    Pack years: 2.50    Types: Cigarettes    Quit date: 03/30/1976    Years since quitting: 42.8  . Smokeless tobacco: Never Used  Substance and Sexual Activity  . Alcohol use:  No    Alcohol/week: 0.0 standard drinks  . Drug use: No  . Sexual activity: Yes  Other Topics Concern  . Not on file  Social History Narrative  . Not on file   Social Determinants of Health   Financial Resource Strain:   . Difficulty of Paying Living Expenses: Not on file  Food Insecurity:   . Worried About Charity fundraiser in the Last Year: Not on file  . Ran Out of Food in the Last Year: Not on file  Transportation Needs:   . Lack of Transportation (Medical): Not on file  . Lack of  Transportation (Non-Medical): Not on file  Physical Activity:   . Days of Exercise per Week: Not on file  . Minutes of Exercise per Session: Not on file  Stress:   . Feeling of Stress : Not on file  Social Connections:   . Frequency of Communication with Friends and Family: Not on file  . Frequency of Social Gatherings with Friends and Family: Not on file  . Attends Religious Services: Not on file  . Active Member of Clubs or Organizations: Not on file  . Attends Archivist Meetings: Not on file  . Marital Status: Not on file     Family History: The patient's family history includes Coronary artery disease in an other family member; Hypertension in his father; Melanoma in his mother; Stroke in his father. There is no history of Colon cancer, Esophageal cancer, Stomach cancer, Rectal cancer, Pancreatic cancer, Liver disease, or Inflammatory bowel disease.  ROS:   Please see the history of present illness.     All other systems reviewed and are negative.  EKGs/Labs/Other Studies Reviewed:    The following studies were reviewed today:  Cath 01/05/2019 Conclusions: 1. Severe native coronary artery disease, including 75% ostial LMCA stenosis, 50% mid LAD disease, sequential 90% and 70% proximal/mid LCx stenoses as well as 70% mid OM1 lesion, and sequential 70% ostial, 80% proximal, and 95% distal RCA stenoses. 2. Widely patent LIMA-LAD. 3. Chronically occluded  SVG-OM1. 4. Subtotally occluded SVG-rPDA-rPL with 99% mid graft stenosis followed by extensive thrombus, some of which has embolized into the distal branches. 5. Upper normal left ventricular filling pressure. 6. Successful PCI to SVG-rPDA-rPL using overlapping Resolute Onyx 3.0 x 18 mm, 3.0 x 38 mm, and 3.0 x 38 mm drug-eluting stents with 0% residual stenosis and TIMI-3 flow.  Small amount of nonocclusive thrombus remained in the rPL branch at the end of the procedure.  Recommendations: 1. Indefinite dual antiplatelet therapy with aspirin and clopidogrel, as tolerated. 2. Medical therapy of protected LMCA and LCx disease.  If the patient has refractory symptoms, complex PCI of the ostial LMCA, LCx, and OM1 would need to be considered. 3. Aggressive secondary prevention. 4. Gentle post-catheterization hydration with close monitoring of renal function and hemoglobin, given contrast exposure and glycoprotein IIb/IIIa use during the intervention.   Echo 01/05/2019 IMPRESSIONS    1. Left ventricular ejection fraction, by visual estimation, is 40 to 45%. The left ventricle has mild to moderately decreased function. There is mildly increased left ventricular hypertrophy.  2. Severe hypokinesis of the left ventricular, entire inferior wall and inferoseptal wall.  3. Elevated left atrial and left ventricular end-diastolic pressures.  4. Left ventricular diastolic parameters are consistent with Grade I diastolic dysfunction (impaired relaxation).  5. The left ventricle demonstrates regional wall motion abnormalities.  6. Global right ventricle has mildly reduced systolic function.The right ventricular size is normal. No increase in right ventricular wall thickness.  7. Left atrial size was moderately dilated.  8. Right atrial size was normal.  9. The mitral valve is abnormal. Mild mitral valve regurgitation. Posterior leaflet tethering is suspected. 10. The tricuspid valve is grossly normal.  Tricuspid valve regurgitation is trivial. 11. The aortic valve is tricuspid. Aortic valve regurgitation is trivial. Mild aortic valve sclerosis without stenosis. 12. The pulmonic valve was grossly normal. Pulmonic valve regurgitation is not visualized. 13. Possible atrial shunt by color doppler. 14. Cannot exclude ASD/PFO. Consider transesophageal echocardiogram, if clinically indicated. 15. The inferior vena cava is normal in size with  greater than 50% respiratory variability, suggesting right atrial pressure of 3 mmHg. 16. The left ventricular function has worsened.   EKG:  EKG is ordered today.  The ekg ordered today demonstrates normal sinus rhythm, T wave inversion in inferolateral leads.  Q waves in the inferior lead.  Recent Labs: 04/04/2018: Magnesium 1.2 07/27/2018: Pro B Natriuretic peptide (BNP) 150.0; TSH 3.05 01/04/2019: ALT 25; B Natriuretic Peptide 1,392.1 01/19/2019: BUN 19; Creatinine, Ser 1.56; Hemoglobin 11.5; Platelets 229; Potassium 4.1; Sodium 140  Recent Lipid Panel    Component Value Date/Time   CHOL 80 03/31/2018 0508   CHOL 99 (L) 12/24/2017 0819   CHOL 116 03/30/2013 1053   TRIG 72 03/31/2018 0508   TRIG 80 03/30/2013 1053   HDL 32 (L) 03/31/2018 0508   HDL 28 (L) 12/24/2017 0819   HDL 34 (L) 03/30/2013 1053   CHOLHDL 2.5 03/31/2018 0508   VLDL 14 03/31/2018 0508   LDLCALC 34 03/31/2018 0508   LDLCALC 32 12/24/2017 0819   LDLCALC 66 03/30/2013 1053   LDLDIRECT 90.3 04/17/2008 0911    Physical Exam:    VS:  BP (!) 142/68   Pulse 78   Temp (!) 97.2 F (36.2 C)   Ht 5\' 9"  (1.753 m)   Wt 174 lb 9.6 oz (79.2 kg)   SpO2 97%   BMI 25.78 kg/m     Wt Readings from Last 3 Encounters:  01/19/19 174 lb 6 oz (79.1 kg)  01/19/19 174 lb 9.6 oz (79.2 kg)  01/17/19 170 lb (77.1 kg)     GEN:  Well nourished, well developed in no acute distress HEENT: Normal NECK: No JVD; No carotid bruits LYMPHATICS: No lymphadenopathy CARDIAC: RRR, no murmurs, rubs,  gallops RESPIRATORY:  Clear to auscultation without rales, wheezing or rhonchi  ABDOMEN: Soft, non-tender, non-distended MUSCULOSKELETAL:  No edema; No deformity  SKIN: Warm and dry NEUROLOGIC:  Alert and oriented x 3 PSYCHIATRIC:  Normal affect   ASSESSMENT:    1. Non-ST elevation (NSTEMI) myocardial infarction ([place])   2. Anemia, unspecified type   3. AKI (acute kidney injury) ([place])   4. Essential hypertension   5. Ischemic cardiomyopathy   6. Coronary artery disease involving coronary bypass graft of native heart without angina pectoris   7. Hyperlipidemia LDL goal <70   8. OSA (obstructive sleep apnea)    PLAN:    In order of problems listed above:  1. NSTEMI: Recently underwent DES x3.  Continue aspirin and Plavix.  Denies any further anginal symptom.  2. CAD s/p CABG: Recent cardiac catheterization revealed subtotally occluded SVG to RPDA-RPL.  This was treated with three overlapping drug-eluting stents.  Continue aspirin, Plavix and beta-blocker.  3. Ischemic cardiomyopathy: Euvolemic on physical exam  4. AKI: Obtain basic metabolic panel  5. Anemia: Repeat CBC.  6. Hypertension: Continue on current therapy  7. Hyperlipidemia: On Crestor and Zetia  8. Obstructive sleep apnea: On CPAP.   Medication Adjustments/Labs and Tests Ordered: Current medicines are reviewed at length with the patient today.  Concerns regarding medicines are outlined above.  Orders Placed This Encounter  Procedures  . Basic metabolic panel  . CBC  . EKG 12-Lead   No orders of the defined types were placed in this encounter.   Patient Instructions  Medication Instructions:  Your physician recommends that you continue on your current medications as directed. Please refer to the Current Medication list given to you today.  *If you need a refill on your cardiac medications before  your next appointment, please call your pharmacy*  Lab Work: Your physician recommends that you return for  lab work today: CBC, BMET  If you have labs (blood work) drawn today and your tests are completely normal, you will receive your results only by: [name] MyChart Message (if you have MyChart) OR . A paper copy in the mail If you have any lab test that is abnormal or we need to change your treatment, we will call you to review the results.  Follow-Up: At [facility], you and your health needs are our priority.  As part of our continuing mission to provide you with exceptional heart care, we have created designated Provider Care Teams.  These Care Teams include your primary Cardiologist (physician) and Advanced Practice Providers (APPs -  Physician Assistants and Nurse Practitioners) who all work together to provide you with the care you need, when you need it.  Your next appointment:   2-3 month(s)  The format for your next appointment:   Either In Person or Virtual  Provider:   You may see [name], MD or one of the following Advanced Practice Providers on your designated Care Team:    Almyra Deforest, PA-C  Fabian Sharp, PA-C or   Roby Lofts, PA-C     Signed, [place], [place]  01/21/2019 8:51 PM    [place]

## 2019-01-20 DIAGNOSIS — I219 Acute myocardial infarction, unspecified: Secondary | ICD-10-CM

## 2019-01-20 HISTORY — DX: Acute myocardial infarction, unspecified: I21.9

## 2019-01-21 ENCOUNTER — Encounter: Payer: Self-pay | Admitting: Physician Assistant

## 2019-01-23 ENCOUNTER — Other Ambulatory Visit: Payer: Self-pay

## 2019-01-23 DIAGNOSIS — Z9049 Acquired absence of other specified parts of digestive tract: Secondary | ICD-10-CM

## 2019-01-23 DIAGNOSIS — K8050 Calculus of bile duct without cholangitis or cholecystitis without obstruction: Secondary | ICD-10-CM

## 2019-01-23 DIAGNOSIS — Z9889 Other specified postprocedural states: Secondary | ICD-10-CM

## 2019-01-23 DIAGNOSIS — K529 Noninfective gastroenteritis and colitis, unspecified: Secondary | ICD-10-CM

## 2019-01-23 DIAGNOSIS — Z8719 Personal history of other diseases of the digestive system: Secondary | ICD-10-CM

## 2019-01-24 DIAGNOSIS — A419 Sepsis, unspecified organism: Secondary | ICD-10-CM | POA: Diagnosis not present

## 2019-01-24 DIAGNOSIS — J189 Pneumonia, unspecified organism: Secondary | ICD-10-CM | POA: Diagnosis not present

## 2019-01-24 DIAGNOSIS — J9601 Acute respiratory failure with hypoxia: Secondary | ICD-10-CM | POA: Diagnosis not present

## 2019-01-24 DIAGNOSIS — Z48812 Encounter for surgical aftercare following surgery on the circulatory system: Secondary | ICD-10-CM | POA: Diagnosis not present

## 2019-01-24 DIAGNOSIS — J440 Chronic obstructive pulmonary disease with acute lower respiratory infection: Secondary | ICD-10-CM | POA: Diagnosis not present

## 2019-01-25 ENCOUNTER — Other Ambulatory Visit: Payer: Medicare Other

## 2019-01-25 DIAGNOSIS — K529 Noninfective gastroenteritis and colitis, unspecified: Secondary | ICD-10-CM

## 2019-01-26 DIAGNOSIS — J9601 Acute respiratory failure with hypoxia: Secondary | ICD-10-CM | POA: Diagnosis not present

## 2019-01-26 DIAGNOSIS — J189 Pneumonia, unspecified organism: Secondary | ICD-10-CM | POA: Diagnosis not present

## 2019-01-26 DIAGNOSIS — Z48812 Encounter for surgical aftercare following surgery on the circulatory system: Secondary | ICD-10-CM | POA: Diagnosis not present

## 2019-01-26 DIAGNOSIS — A419 Sepsis, unspecified organism: Secondary | ICD-10-CM | POA: Diagnosis not present

## 2019-01-26 DIAGNOSIS — J440 Chronic obstructive pulmonary disease with acute lower respiratory infection: Secondary | ICD-10-CM | POA: Diagnosis not present

## 2019-01-28 ENCOUNTER — Other Ambulatory Visit: Payer: Self-pay | Admitting: Internal Medicine

## 2019-01-30 ENCOUNTER — Telehealth: Payer: Self-pay | Admitting: Cardiovascular Disease

## 2019-01-30 DIAGNOSIS — J189 Pneumonia, unspecified organism: Secondary | ICD-10-CM | POA: Diagnosis not present

## 2019-01-30 DIAGNOSIS — A419 Sepsis, unspecified organism: Secondary | ICD-10-CM | POA: Diagnosis not present

## 2019-01-30 DIAGNOSIS — J440 Chronic obstructive pulmonary disease with acute lower respiratory infection: Secondary | ICD-10-CM | POA: Diagnosis not present

## 2019-01-30 DIAGNOSIS — J9601 Acute respiratory failure with hypoxia: Secondary | ICD-10-CM | POA: Diagnosis not present

## 2019-01-30 DIAGNOSIS — Z48812 Encounter for surgical aftercare following surgery on the circulatory system: Secondary | ICD-10-CM | POA: Diagnosis not present

## 2019-01-30 NOTE — Telephone Encounter (Signed)
  We are recommending the COVID-19 vaccine to all of our patients. Cardiac medications (including blood thinners) should not deter anyone from being vaccinated and there is no need to hold any of those medications prior to vaccine administration.     Currently, there is a hotline to call (active 01/27/19) to schedule vaccination appointments as no walk-ins will be accepted.   Number: [phone]    If you have further questions or concerns about the vaccine process, please visit [email or web address] or contact your primary care physician.   Advised patient of about information

## 2019-02-01 LAB — PANCREATIC ELASTASE, FECAL: Pancreatic Elastase-1, Stool: >500 mcg/g

## 2019-02-02 ENCOUNTER — Ambulatory Visit: Payer: Medicare Other | Admitting: Internal Medicine

## 2019-02-02 DIAGNOSIS — J9601 Acute respiratory failure with hypoxia: Secondary | ICD-10-CM | POA: Diagnosis not present

## 2019-02-02 DIAGNOSIS — Z48812 Encounter for surgical aftercare following surgery on the circulatory system: Secondary | ICD-10-CM | POA: Diagnosis not present

## 2019-02-02 DIAGNOSIS — J440 Chronic obstructive pulmonary disease with acute lower respiratory infection: Secondary | ICD-10-CM | POA: Diagnosis not present

## 2019-02-02 DIAGNOSIS — J189 Pneumonia, unspecified organism: Secondary | ICD-10-CM | POA: Diagnosis not present

## 2019-02-02 DIAGNOSIS — A419 Sepsis, unspecified organism: Secondary | ICD-10-CM | POA: Diagnosis not present

## 2019-02-07 ENCOUNTER — Encounter: Payer: Self-pay | Admitting: Internal Medicine

## 2019-02-07 ENCOUNTER — Other Ambulatory Visit: Payer: Self-pay

## 2019-02-07 ENCOUNTER — Ambulatory Visit (INDEPENDENT_AMBULATORY_CARE_PROVIDER_SITE_OTHER)
Admission: RE | Admit: 2019-02-07 | Discharge: 2019-02-07 | Disposition: A | Discharge status: Home / Self Care | Payer: Medicare Other | Source: Ambulatory Visit | Attending: Internal Medicine | Admitting: Internal Medicine

## 2019-02-07 ENCOUNTER — Ambulatory Visit: Payer: Medicare Other | Admitting: Internal Medicine

## 2019-02-07 ENCOUNTER — Ambulatory Visit: Payer: Medicare Other

## 2019-02-07 DIAGNOSIS — R0600 Dyspnea, unspecified: Secondary | ICD-10-CM

## 2019-02-07 DIAGNOSIS — J90 Pleural effusion, not elsewhere classified: Secondary | ICD-10-CM

## 2019-02-07 DIAGNOSIS — R0609 Other forms of dyspnea: Secondary | ICD-10-CM

## 2019-02-07 NOTE — Progress Notes (Signed)
[name], male    DOB: January 12, 1941     MRN: [id number]   Brief patient profile:  39 yowm quit smoking 1978  cc " nasal congestion" all his life, took shots in his 58s but never really helped then sob  started around late 1990s (@ his  mid 95's) > cards eval > cabg no better even p cardiac rehab and gradually worse to point where [facility] = can't walk a nl pace on a flat grade s sob but does fine slow and flat so referred to pulmonary clinic 07/27/2018 by Dr   Jerilee Hoh      History of Present Illness  07/27/2018  Pulmonary/ 1st office eval/Graig Hessling  Chief Complaint  Patient presents with  . Pulmonary Consult    Referred by Dr. Matthew Saras. Pt c/o SOB for the past year. He states he gets out of breath just bending over. He also gets SOB lying flat and when he lies on his right side.   Dyspnea: MMRC2 = can't walk a nl pace on a flat grade s sob but does fine slow and flat  Cough: better p sinus surgery  Sleep: wakes up every few hours s cough or gasping for breath, dx osa > can't tolerate cpap / wakes up feeling back pain and tired  SABA use: none  250 ft gentle hill to mb but hasn't done in a year and says he think he could in pm's but "not before lunch cause I'm hurting way too bad all over" rec Change pantoprazole to where you take it Take 30- 60 min before your first and last meals of the day  GERD diet  To get the most out of exercise, you need to be continuously aware that you are short of breath, but never out of breath, for 15- 30 minutes daily. As you improve, it will actually be easier for you to do the same amount of exercise  in  30 minutes so always push to the level where you are short of breath.      09/07/2018  f/u ov/Aundre Hietala re: L effusion p pancreatitis/ doe and uacs, did not bring meds/not following gerd diet with active dysphagia Chief Complaint  Patient presents with  . Follow-up    Patient reports that he still has sob with exertion. He reports that since his  last visit he has had a couple really bad days.   Dyspnea:  mb still makes sob and has to stop twice both ways  Cough: clear throat freq/ ?  better on protonix min mucus - same problem as long as he can remember  "head always stopped up but better p sinus surger around 2000 / using lots of mints / min actual mucus  Sleeping: cough worse at hs despite bed blocks and 4 in  rec GERD  Continue protonix 40 mg Take 30- 60 min before your first and last meals of the day and share your symptoms of difficulty swallowing with your GI doctor Please schedule a follow up office visit in 6 weeks, call sooner if needed with all medications /inhalers/ solutions    11/02/2018  f/u ov/Arsh Feutz re:  L effusion / pancreatitis brought some but not all meds  Chief Complaint  Patient presents with  . Follow-up    Pt c/o increased congestion in his head- c/o HA and sinus pressure.    Dyspnea:  Improved mb and back s stopping if does it in am "before legs get tired"  Cough:  head and chest congestion/ "loose liquid"  Can't tell what color - rx flonase a couple of times a week / "antihistamine" helps  Worse day vs noct  Sleeping: bed blocks plus pillow around 20 degrees HOB does fine  SABA use: none  02: none  rec Zyrtec is best choice for a night time antihistamine take as needed  Ok to take flonase 1-2 at bedtime and if not improving then you will to see ENT Janace Hoard)      02/07/2019  f/u ov/Hayli Milligan re:  L effusion in setting of h/o pancreatitis  And transient ischemic chf  Chief Complaint  Patient presents with  . Follow-up    Breathing is much improved since the last visit. He c/o "head and chest congestion".   Dyspnea:  mb and back better 300 ft x says it's up to a  20 degree incline Cough: none / never saw Janace Hoard but 50 % better on zyrtec / flonase  Sleeping: 4 in bed blocks  SABA use: none  02: none    No obvious day to day or daytime variability or assoc excess/ purulent sputum or mucus plugs or hemoptysis  or cp or chest tightness, subjective wheeze or overt sinus or hb symptoms.   Sleeping  without nocturnal  or early am exacerbation  of respiratory  c/o's or need for noct saba. Also denies any obvious fluctuation of symptoms with weather or environmental changes or other aggravating or alleviating factors except as outlined above   No unusual exposure hx or h/o childhood pna/ asthma or knowledge of premature birth.  Current Allergies, Complete Past Medical History, Past Surgical History, Family History, and Social History were reviewed in [employer] record.  ROS  The following are not active complaints unless bolded Hoarseness, sore throat, dysphagia, dental problems, itching, sneezing,  nasal congestion or discharge of excess mucus or purulent secretions, ear ache,   fever, chills, sweats, unintended wt loss or wt gain, classically pleuritic or exertional cp,  orthopnea pnd or arm/hand swelling  or leg swelling, presyncope, palpitations, abdominal pain, anorexia, nausea, vomiting, diarrhea  or change in bowel habits or change in bladder habits, change in stools or change in urine, dysuria, hematuria,  rash, arthralgias, visual complaints, headache, numbness, weakness or ataxia or problems with walking or coordination,  change in mood or  memory.        Current Meds  Medication Sig  . acetaminophen (TYLENOL) 325 MG tablet Take 650 mg by mouth every 6 (six) hours as needed for mild pain.  [name] amLODipine (NORVASC) 2.5 MG tablet Take 2 tablets (5 mg total) by mouth daily.  [name] aspirin EC 81 MG tablet Take 81 mg by mouth at bedtime.  [name] BYSTOLIC 10 MG tablet Take 10 mg by mouth daily.  . clopidogrel (PLAVIX) 75 MG tablet Take 1 tablet (75 mg total) by mouth daily with breakfast.  . cyanocobalamin (,VITAMIN B-12,) 1000 MCG/ML injection INJECT 1ML INTO THE MUSCLE FOR 1 DOSE  . dicyclomine (BENTYL) 10 MG capsule Take 1 capsule (10 mg total) by mouth every 8 (eight) hours as needed for  spasms.  [name] dicyclomine (BENTYL) 10 MG capsule Take 1 capsule (10 mg total) by mouth 4 (four) times daily -  before meals and at bedtime.  [name] ezetimibe (ZETIA) 10 MG tablet Take 1 tablet (10 mg total) by mouth daily.  . Fiber Complete TABS Take 1 tablet by mouth daily.  . fluticasone (FLONASE) 50 MCG/ACT nasal spray Place 1-2 sprays  into both nostrils at bedtime as needed for allergies or rhinitis.  . folic acid (FOLVITE) A999333 MCG tablet Take 400 mcg by mouth daily.    . isosorbide mononitrate (IMDUR) 60 MG 24 hr tablet Take 1 tablet (60 mg total) by mouth daily.  . Menthol, Topical Analgesic, (BENGAY EX) Apply 1 application topically daily as needed (pain).  . Multiple Vitamins-Minerals (MULTIVITAMIN WITH MINERALS) tablet Take 1 tablet by mouth daily.  . Naphazoline-Pheniramine (OPCON-A) 0.027-0.315 % SOLN Place 1 drop into both eyes daily as needed (itching eyes).  . NEEDLE, DISP, 25 G (B-D DISP NEEDLE 25GX1") 25G X 1" MISC Inject 1000 mcg into muscle once a month.  . nitroGLYCERIN (NITROSTAT) 0.4 MG SL tablet Place 1 tablet (0.4 mg total) under the tongue every 5 (five) minutes as needed for chest pain.  [name] omeprazole (PRILOSEC) 40 MG capsule Take 1 capsule (40 mg total) by mouth 2 (two) times daily after a meal.  . oxyCODONE-acetaminophen (PERCOCET) 10-325 MG tablet Take 1 tablet by mouth 2 (two) times daily as needed for pain.  . Probiotic Product (PROBIOTIC ADVANCED PO) Take 1 tablet by mouth 2 (two) times daily.   [name] rOPINIRole (REQUIP) 2 MG tablet TAKE 1 TABLET BY MOUTH AT BEDTIME  . rosuvastatin (CRESTOR) 20 MG tablet Take 20 mg by mouth daily.  [name] saccharomyces boulardii (FLORASTOR) 250 MG capsule Take 1 capsule (250 mg total) by mouth 2 (two) times daily.  [name] venlafaxine (EFFEXOR) 75 MG tablet Take 1 tablet (75 mg total) by mouth daily.                 Objective:    02/07/2019        175  11/02/2018      181  09/07/18 179 lb 9.6 oz (81.5 kg)  07/27/18 176 lb (79.8 kg)  05/19/18  180 lb (81.6 kg)   Vital signs reviewed - Note on arrival 02 sats  95% on RA      HEENT : pt wearing mask not removed for exam due to covid -19 concerns.    NECK :  without JVD/Nodes/TM/ nl carotid upstrokes bilaterally   LUNGS: no acc muscle use,  Nl contour chest which is clear to A and P bilaterally without cough on insp or exp maneuvers   CV:  RRR  no s3 or murmur or increase in P2, and no edema   ABD:  soft and nontender with nl inspiratory excursion in the supine position. No bruits or organomegaly appreciated, bowel sounds nl  MS:  Nl gait/ ext warm without deformities, calf tenderness, cyanosis or clubbing No obvious joint restrictions   SKIN: warm and dry without lesions    NEURO:  alert, approp, nl sensorium with  no motor or cerebellar deficits apparent.      CXR PA and Lateral:   02/07/2019 :    I personally reviewed images and agree with radiology impression as follows:     Interval resolution of the patchy/nodular bilateral airspace disease.  Stable left pleural scarring and pleuroparenchymal disease at the left base.   CTa 01/04/2019 : Derald Macleod: Mild diffuse pleural thickening overlying the left lung              Assessment

## 2019-02-07 NOTE — Progress Notes (Signed)
Spoke with pt and notified of results per Dr. Wert. Pt verbalized understanding and denied any questions. 

## 2019-02-07 NOTE — Patient Instructions (Signed)
Please remember to go to the  x-ray department  for your tests - we will call you with the results when they are available      If you are satisfied with your treatment plan,  let your doctor know and he/she can either refill your medications or you can return here when your prescription runs out.     If in any way you are not 100% satisfied,  please tell us.  If 100% better, tell your friends!  Pulmonary follow up is as needed

## 2019-02-08 ENCOUNTER — Encounter: Payer: Self-pay | Admitting: Internal Medicine

## 2019-02-08 ENCOUNTER — Telehealth (HOSPITAL_COMMUNITY): Payer: Self-pay | Admitting: *Deleted

## 2019-02-08 NOTE — Assessment & Plan Note (Signed)
Onset late 1990's  - Spirometry 10/22/09   FEV1 2.96 (86%)  With  fev1/fvc ratio 0.85 no graphics avail  - 07/27/2018   Walked RA  2 laps @  approx 241ft each @ avg pace  stopped due to  End of study, sob but not out of breath with sats still 94% -  D dimer 09/27/18 = 4.01 >>> perfusion lung scan 07/28/2018 > no evidence of pe - venous dopplers 07/29/18  done due to pos d dimer > neg bilaterally  - 09/07/2018   Walked RA  2 laps @  approx 250 ft each @ slow then fast pace  stopped due to  End of study, no sob and sats 94% at end of study  Resolved p rx for IHD > no further pulmonary f/u needed          Each maintenance medication was reviewed in detail including emphasizing most importantly the difference between maintenance and prns and under what circumstances the prns are to be triggered using an action plan format that is not reflected in the computer generated alphabetically organized AVS which I have not found useful in most complex patients, especially with respiratory illnesses  Total time for H and P, chart review, counseling,   and generating AVS / charting =  20 min

## 2019-02-08 NOTE — Telephone Encounter (Signed)
Pt daughter called in response to a letter that her father [name] received regarding Cardiac Rehab.  Pt is on his last week of PT and per daughter he is doing well and she sees an improvement in his stamina.  Although she quickly adds that he is not doing any exercise or activity on non therapy days.   Reviewed with pt daughter the virtual platform for cardiac rehab as we are not able to bring patients on site as staff have been deployed to support areas in need due to covid.  Pt daughter understood and works for the health department here in [employer]. Pt daughter felt her father would need more supervision and  Immediate accountability to be successful and asked that we contact her father when we are able to schedule onsite. Discussed ways to increase activity in his ADL's. Cherre Huger, BSN Cardiac and Training and development officer

## 2019-02-08 NOTE — Assessment & Plan Note (Signed)
First noted 06/23/18 Abd ct f/u from pancreatitis -  Perfusion lung scan 07/28/18  neg for pe/ venous dopplers nl - L chest u/s  08/04/2018  No free effusion to tap - f/u cxr 11/02/2018 no change very small/ loculated L effsuion  - CT 01/04/2019  As dz //Pleura: Mild diffuse pleural thickening overlying the left Lung  - cxr 02/07/2019 residual scarring only   Likely this was result of HCAP or pancreatitis but either way no further f/u needed/ advised

## 2019-02-09 DIAGNOSIS — A419 Sepsis, unspecified organism: Secondary | ICD-10-CM | POA: Diagnosis not present

## 2019-02-09 DIAGNOSIS — Z48812 Encounter for surgical aftercare following surgery on the circulatory system: Secondary | ICD-10-CM | POA: Diagnosis not present

## 2019-02-09 DIAGNOSIS — J9601 Acute respiratory failure with hypoxia: Secondary | ICD-10-CM | POA: Diagnosis not present

## 2019-02-09 DIAGNOSIS — J189 Pneumonia, unspecified organism: Secondary | ICD-10-CM | POA: Diagnosis not present

## 2019-02-09 DIAGNOSIS — J440 Chronic obstructive pulmonary disease with acute lower respiratory infection: Secondary | ICD-10-CM | POA: Diagnosis not present

## 2019-02-13 DIAGNOSIS — J440 Chronic obstructive pulmonary disease with acute lower respiratory infection: Secondary | ICD-10-CM | POA: Diagnosis not present

## 2019-02-13 DIAGNOSIS — Z48812 Encounter for surgical aftercare following surgery on the circulatory system: Secondary | ICD-10-CM | POA: Diagnosis not present

## 2019-02-13 DIAGNOSIS — J9601 Acute respiratory failure with hypoxia: Secondary | ICD-10-CM | POA: Diagnosis not present

## 2019-02-13 DIAGNOSIS — J189 Pneumonia, unspecified organism: Secondary | ICD-10-CM | POA: Diagnosis not present

## 2019-02-13 DIAGNOSIS — A419 Sepsis, unspecified organism: Secondary | ICD-10-CM | POA: Diagnosis not present

## 2019-02-15 DIAGNOSIS — Z8551 Personal history of malignant neoplasm of bladder: Secondary | ICD-10-CM | POA: Diagnosis not present

## 2019-02-15 DIAGNOSIS — R3912 Poor urinary stream: Secondary | ICD-10-CM | POA: Diagnosis not present

## 2019-02-16 DIAGNOSIS — J189 Pneumonia, unspecified organism: Secondary | ICD-10-CM | POA: Diagnosis not present

## 2019-02-16 DIAGNOSIS — A419 Sepsis, unspecified organism: Secondary | ICD-10-CM | POA: Diagnosis not present

## 2019-02-16 DIAGNOSIS — J440 Chronic obstructive pulmonary disease with acute lower respiratory infection: Secondary | ICD-10-CM | POA: Diagnosis not present

## 2019-02-16 DIAGNOSIS — J9601 Acute respiratory failure with hypoxia: Secondary | ICD-10-CM | POA: Diagnosis not present

## 2019-02-16 DIAGNOSIS — Z48812 Encounter for surgical aftercare following surgery on the circulatory system: Secondary | ICD-10-CM | POA: Diagnosis not present

## 2019-02-21 DIAGNOSIS — J189 Pneumonia, unspecified organism: Secondary | ICD-10-CM | POA: Diagnosis not present

## 2019-02-21 DIAGNOSIS — J440 Chronic obstructive pulmonary disease with acute lower respiratory infection: Secondary | ICD-10-CM | POA: Diagnosis not present

## 2019-02-21 DIAGNOSIS — Z48812 Encounter for surgical aftercare following surgery on the circulatory system: Secondary | ICD-10-CM | POA: Diagnosis not present

## 2019-02-21 DIAGNOSIS — A419 Sepsis, unspecified organism: Secondary | ICD-10-CM | POA: Diagnosis not present

## 2019-02-21 DIAGNOSIS — J9601 Acute respiratory failure with hypoxia: Secondary | ICD-10-CM | POA: Diagnosis not present

## 2019-02-22 ENCOUNTER — Telehealth: Payer: Self-pay | Admitting: Internal Medicine

## 2019-02-22 DIAGNOSIS — G629 Polyneuropathy, unspecified: Secondary | ICD-10-CM

## 2019-02-22 DIAGNOSIS — Z9181 History of falling: Secondary | ICD-10-CM

## 2019-02-22 DIAGNOSIS — G8929 Other chronic pain: Secondary | ICD-10-CM

## 2019-02-22 NOTE — Telephone Encounter (Signed)
Pt's daughter, Zettie Pho, called to see if she can get her father's physical therapy extended because he is unable to go in for Cardiac rehab  [name] can be reached at [phone]

## 2019-02-23 DIAGNOSIS — J440 Chronic obstructive pulmonary disease with acute lower respiratory infection: Secondary | ICD-10-CM | POA: Diagnosis not present

## 2019-02-23 DIAGNOSIS — Z48812 Encounter for surgical aftercare following surgery on the circulatory system: Secondary | ICD-10-CM | POA: Diagnosis not present

## 2019-02-23 DIAGNOSIS — J189 Pneumonia, unspecified organism: Secondary | ICD-10-CM | POA: Diagnosis not present

## 2019-02-23 DIAGNOSIS — J9601 Acute respiratory failure with hypoxia: Secondary | ICD-10-CM | POA: Diagnosis not present

## 2019-02-23 DIAGNOSIS — A419 Sepsis, unspecified organism: Secondary | ICD-10-CM | POA: Diagnosis not present

## 2019-02-28 NOTE — Addendum Note (Signed)
Addended by: Westley Hummer B on: 02/28/2019 01:31 PM   Modules accepted: Orders

## 2019-02-28 NOTE — Telephone Encounter (Signed)
Spoke with daughter and patient request [place].  Referral placed.

## 2019-02-28 NOTE — Telephone Encounter (Signed)
[place] for PT referral.  [facility]

## 2019-03-01 DIAGNOSIS — A419 Sepsis, unspecified organism: Secondary | ICD-10-CM | POA: Diagnosis not present

## 2019-03-01 DIAGNOSIS — J440 Chronic obstructive pulmonary disease with acute lower respiratory infection: Secondary | ICD-10-CM | POA: Diagnosis not present

## 2019-03-01 DIAGNOSIS — J189 Pneumonia, unspecified organism: Secondary | ICD-10-CM | POA: Diagnosis not present

## 2019-03-01 DIAGNOSIS — Z48812 Encounter for surgical aftercare following surgery on the circulatory system: Secondary | ICD-10-CM | POA: Diagnosis not present

## 2019-03-01 DIAGNOSIS — J9601 Acute respiratory failure with hypoxia: Secondary | ICD-10-CM | POA: Diagnosis not present

## 2019-03-02 ENCOUNTER — Telehealth (HOSPITAL_COMMUNITY): Payer: Self-pay

## 2019-03-02 NOTE — Telephone Encounter (Signed)
Pt insurance is active and benefits verified through [employer] Medicare Co-pay 0, DED 0/0 met, out of pocket $4,500/$94.59 met, co-insurance 0%. no pre-authorization required. Passport, 03/02/2019@10:50am, REF# [id number]  Will contact patient to see if he is interested in the Cardiac Rehab Program. If interested, patient will need to complete follow up appt. Once completed, patient will be contacted for scheduling upon review by the RN Navigator. 

## 2019-03-07 NOTE — Telephone Encounter (Signed)
Pt stated that he is not interested in the cardiac rehab right now and will call back if anything changes. Closed referral.

## 2019-03-08 DIAGNOSIS — J440 Chronic obstructive pulmonary disease with acute lower respiratory infection: Secondary | ICD-10-CM | POA: Diagnosis not present

## 2019-03-08 DIAGNOSIS — J9601 Acute respiratory failure with hypoxia: Secondary | ICD-10-CM | POA: Diagnosis not present

## 2019-03-08 DIAGNOSIS — J189 Pneumonia, unspecified organism: Secondary | ICD-10-CM | POA: Diagnosis not present

## 2019-03-08 DIAGNOSIS — Z48812 Encounter for surgical aftercare following surgery on the circulatory system: Secondary | ICD-10-CM | POA: Diagnosis not present

## 2019-03-08 DIAGNOSIS — A419 Sepsis, unspecified organism: Secondary | ICD-10-CM | POA: Diagnosis not present

## 2019-03-08 NOTE — Telephone Encounter (Signed)
Patient's daughter called saying that Simona Huh the physical therapist told the patient that today was his last day because they haven't received any orders to extend his PT.   Simona Huh number is [phone]  Simona Huh works for [employer]

## 2019-03-08 NOTE — Telephone Encounter (Signed)
Left detailed message on machine for [place] with verbal orders.

## 2019-03-11 ENCOUNTER — Other Ambulatory Visit: Payer: Self-pay | Admitting: Internal Medicine

## 2019-03-21 ENCOUNTER — Telehealth: Payer: Self-pay | Admitting: Internal Medicine

## 2019-03-21 NOTE — Telephone Encounter (Signed)
Order faxed and confirmed and son in law is aware.

## 2019-03-21 NOTE — Telephone Encounter (Signed)
[name] has been waiting for two weeks for PT form to be completed for the pt to continue the PT.  [place]  [phone]) AttnSimona Huh.  Pts son-in-law is wanting a call back to let them know that this can be done today.  Pts family is aware that Dr. Jerilee Hoh is not in the office she has been pulled to work in the hospital they expressed understanding.

## 2019-03-23 ENCOUNTER — Telehealth: Payer: Self-pay

## 2019-03-23 ENCOUNTER — Telehealth (INDEPENDENT_AMBULATORY_CARE_PROVIDER_SITE_OTHER): Payer: Medicare Other | Admitting: Cardiovascular Disease

## 2019-03-23 ENCOUNTER — Encounter: Payer: Self-pay | Admitting: Cardiovascular Disease

## 2019-03-23 VITALS — Wt 175.0 lb

## 2019-03-23 DIAGNOSIS — E785 Hyperlipidemia, unspecified: Secondary | ICD-10-CM

## 2019-03-23 DIAGNOSIS — K8050 Calculus of bile duct without cholangitis or cholecystitis without obstruction: Secondary | ICD-10-CM

## 2019-03-23 DIAGNOSIS — I25810 Atherosclerosis of coronary artery bypass graft(s) without angina pectoris: Secondary | ICD-10-CM | POA: Diagnosis not present

## 2019-03-23 DIAGNOSIS — Z951 Presence of aortocoronary bypass graft: Secondary | ICD-10-CM | POA: Diagnosis not present

## 2019-03-23 DIAGNOSIS — N183 Chronic kidney disease, stage 3 unspecified: Secondary | ICD-10-CM

## 2019-03-23 DIAGNOSIS — I214 Non-ST elevation (NSTEMI) myocardial infarction: Secondary | ICD-10-CM

## 2019-03-23 DIAGNOSIS — I255 Ischemic cardiomyopathy: Secondary | ICD-10-CM

## 2019-03-23 NOTE — Progress Notes (Signed)
Virtual Visit via Telephone Note   This visit type was conducted due to national recommendations for restrictions regarding the COVID-19 Pandemic (e.g. social distancing) in an effort to limit this patient's exposure and mitigate transmission in our community.  Due to his co-morbid illnesses, this patient is at least at moderate risk for complications without adequate follow up.  This format is felt to be most appropriate for this patient at this time.  A video evaluation was initiated but  the patient had difficulties with the video transmission and the evaluation was transitioned to a phone visit for completion.    All issues noted in this document were discussed and addressed.  No physical exam could be performed with this format.  Please refer to the patient's chart for his  consent to telehealth for [facility].   Date:  03/23/2019   ID:  [name], DOB [birth year], MRN [id number]  Patient Location: Home Provider Location: office  PCP:  Isaac Bliss, Rayford Halsted, MD  Cardiologist:  [name], MD   Electrophysiologist:  None   Evaluation Performed:  Follow-Up Visit  Chief Complaint:  6 month F/U visit with me  History of Present Illness:    YISSOCHOR DIBBEN is a 79 y.o. male with known CAD who underwent CABG revascularization surgery in 2001 with a LIMA to his LAD, vein to the marginal, vein to the RCA. Catheterization in 2004 showed patent grafts. An echo Doppler study in August 2012 revealed an ejection fraction of 40-45% with moderate septal hypokinesis with grade 1 diastolic dysfunction as well as aortic valve sclerosis. Additional problems include GERD, mild emphysema, as well as obstructive sleep apnea. He no longer utilizes CPAP. He has lost approximately 25 pounds since his wife's death from a motor vehicle accident in January 2013.  He remains active and notes mild shortness of breath with activity particularly when walking up hills but this has not changed.  He  also is a history of restless leg syndrome and has benefited with requip. Laboratory in 2013 demonstrated LDL 57 with LDL particle #[id number], small LDL particle number was 611, slightly increased, HDLC was 35 triglycerides 93 total cholesterol 111 initial resistance course slightly elevated at 53.  BP blood work in March 2015 revealed normal renal function.  Fasting glucose was minimally increased at 106.  Total cholesterol was 116 triglycerides 80, and LDL 66, with an HDL of 34.  LDL particle number was slightly increased at 1190.  He undewent a five-year follow-up nuclear perfusion study in November 2016.  Ejection fraction was 54%.  He had normal perfusion.  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  He admits to arthralgias without myalgias.  He has issues with his in his legs and his peripheral neuropathy.  I further titrated his losartan to 75 mg and Bystolic to 10 mg.  He believes his blood pressure and pulse have been better.  He admits to leg discomfort which seems worse with atorvastatin.  When I last saw him, he had noticed some shortness of breath with uphill walking and with exertion but denied recurrent episodes of chest tightness.   He denied PND, orthopnea.  He underwent a follow-up echo Doppler study on 10/28/2016.  Ejection fraction was excellent at 60-65%.  There was grade 2 diastolic dysfunction.  There was evidence for aortic sclerosis without stenosis.  There was mild LA dilation.  He had trivial PR.  I saw him in October 2018. He subsequently  underwent left hand surgery.  His primary care with [name] is retired.  When I last saw him in November 2019 recently, he  noticed progressive increase in shortness of breath and this seems to develop even trying to walk up very small hill.  He denied any definitive chest tightness.  He has started to run out of some of his chronic pain meds which had been prescribed by Dr.  Burnice Logan.  He has not yet reestablished with another primary physician.  He was on Bystolic 10 mg and isosorbide 60 mg for his CAD and hypertension.  He was on Zetia 10 mg and rosuvastatin for hyperlipidemia.  He continues to be on chronic aspirin therapy.  He has restless legs and is on Requip.  His previous primary physician had prescribed Effexor in addition to Percocet.  In November 2019, with his change in symptomatology I recommended a follow-up echo Doppler study and a lexiscan  Myoview.  As he had had some issues with elevated potassium on ARB therapy and I started him on HCTZ other than spironolactone.  December 23, 2017 an echo Doppler study showed an EF of 60 to 65% with grade 1 diastolic dysfunction and mild aortic valve stenosis.  A Lexiscan Myoview study on January 04, 2018 was low risk and demonstrated normal perfusion without scar or ischemia.  EF was 51%.  He was hospitalized from March 11 through April 06, 2019 presenting with abdominal discomfort,.  Laboratory revealed leukocytosis to 21,400, elevated lactic acid of 2.4, elevated lipase at 390 urinalysis notable for proteinuria and a creatinine had risen to 1.59 and there was mild elevation of AST and total bilirubin.  He was ultimately had positive blood cultures for coli to have sepsis and colitis.  An MRCP showed acute pancreatitis, mild gallbladder distention with peri-cholecystic fluid and gallbladder wall thickening.  School he underwent ERCP on April 01, 2018 which showed stricture and sludge without stone he underwent sphincterotomy and stent placement.  He is planned to be on antibiotics for several weeks.  During his hospitalization he developed acute kidney injury with peak creatinine at 2.27.  His medications were adjusted and on discharge creatinine was 1.75.    He was evaluated by me in a telemedicine evaluation on May 19, 2018.  From a cardiac perspective he felt improved.  He was not having any leg swelling.  He  denied any chest pain but continued to experience shortness of breath symptoms.  Creatinine had improved to 1.46.  He continues to have issues with back and leg discomfort and remotely had undergone steroid injections with [name].  I last saw him in evaluation September 2020 at which time he continued  to experience his chronic shortness of breath but that has not increased in severity.  He denies any chest pain.  He has had some issues with swallowing and continues to see GI physician.  He has undergone esophageal dilatation.  He is unaware of any rhythm disturbance.  He has been evaluated by [name] and he was referred for a lower extremity Doppler study which was negative for DVT.  He had been noted to have a left pleural effusion which is followed by 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  He eventually underwent robotic cholecystectomy by Dr. Windle Guard on 01/02/2019.   He has biliary stents in place for distal biliary stricture.  After his procedure, he developed a bilateral pneumonia and shortness  of breath with increasing troponin.  CT of the chest was negative for PE.  Patient was treated with IV Lasix and antibiotic. Troponin was elevated and he subsequently underwent cardiac catheterization on 01/05/2019 by Dr. Saunders Revel which showed 99% subtotally occluded SVG-RPDA/RPL.  This was treated with 3 overlapping resolute Onyx drug-eluting stents.  Postprocedure, it was recommended to continue aspirin and Plavix indefinitely and if the patient subsequently developed  refractory symptoms there was consideration for PCI of ostial left main, left circumflex and OM1.  Echocardiogram obtained on the same day showed EF 40 to 45%, severe hypokinesis of the entire inferior and inferoseptal wall, grade 1 DD, mild MR, cannot exclude ASD/PFO.  He was seen in follow-up by Loma Sousa on January 19, 2019.  He continued to have fatigue however gradually improving.  He previously had a epigastric pain  which he attributed to acid reflux which retrospectively may have been his angina since there was complete resolution of this discomfort following his hospitalization.  He was walking at home multiple times throughout the day without any issue.    Over the last several months, he denies any recurrent chest pain.  He admits to feeling "rough."  He was getting physical therapy times per week.  He has been staying at home all the time.  His heart rate has been regular.  He is not aware of any palpitations.  He has not been using CPAP.  They requested a telemedicine for this evaluation.  The patient does not have symptoms concerning for COVID-19 infection (fever, chills, cough, or new shortness of breath).    Past Medical History:  Diagnosis Date  . Allergy   . B12 deficiency anemia   . Blood transfusion without reported diagnosis   . CAD (coronary artery disease)   . Cancer ([place])    bladder-   . Colon polyps   . COPD (chronic obstructive pulmonary disease) ([place])   . Depression   . Esophagus, Barrett's   . GERD (gastroesophageal reflux disease)   . History of bladder cancer    Bladder cancer "8 times"  . History of hiatal hernia   . Hyperlipidemia   . Hypertension   . Localized osteoarthrosis, lower leg   . Restless leg syndrome   . Sleep apnea    does not wear cpap  . Stenosis of esophagus    Past Surgical History:  Procedure Laterality Date  . BILIARY BRUSHING  04/01/2018   Procedure: BILIARY BRUSHING;  Surgeon: Rush Landmark Telford Nab., MD;  Location: [place];  Service: Gastroenterology;;  . BILIARY BRUSHING  09/12/2018   Procedure: BILIARY BRUSHING;  Surgeon: Irving Copas., MD;  Location: [place];  Service: Gastroenterology;;  . BILIARY BRUSHING  11/28/2018   Procedure: BILIARY BRUSHING;  Surgeon: Irving Copas., MD;  Location: [place];  Service: Gastroenterology;;  . BILIARY DILATION  09/12/2018   Procedure: BILIARY DILATION;  Surgeon: Irving Copas., MD;  Location: [place];  Service: Gastroenterology;;  . BILIARY DILATION  11/28/2018   Procedure: BILIARY DILATION;  Surgeon: Irving Copas., MD;  Location: [place];  Service: Gastroenterology;;  . BILIARY STENT PLACEMENT  04/01/2018   Procedure: BILIARY STENT PLACEMENT;  Surgeon: Irving Copas., MD;  Location: [place];  Service: Gastroenterology;;  . BILIARY STENT PLACEMENT  09/12/2018   Procedure: BILIARY STENT PLACEMENT;  Surgeon: Irving Copas., MD;  Location: [place];  Service: Gastroenterology;;  . BILIARY STENT PLACEMENT  11/28/2018   Procedure: BILIARY STENT PLACEMENT;  Surgeon:  Mansouraty, Telford Nab., MD;  Location: [place];  Service: Gastroenterology;;  . BIOPSY  04/01/2018   Procedure: BIOPSY;  Surgeon: Irving Copas., MD;  Location: [place];  Service: Gastroenterology;;  . BIOPSY  09/12/2018   Procedure: BIOPSY;  Surgeon: Irving Copas., MD;  Location: [place];  Service: Gastroenterology;;  . BIOPSY  11/28/2018   Procedure: BIOPSY;  Surgeon: Irving Copas., MD;  Location: [place];  Service: Gastroenterology;;  . bladder cancer      x 8 cystoscopy  . CERVICAL DISCECTOMY     ACDF  . COLONOSCOPY  11/17/2005   normal   . CORONARY ARTERY BYPASS GRAFT     x4  . CORONARY STENT INTERVENTION N/A 01/05/2019   Procedure: CORONARY STENT INTERVENTION;  Surgeon: Nelva Bush, MD;  Location: [place] [facility];  Service: Cardiovascular;  Laterality: N/A;  . ENDOSCOPIC MUCOSAL RESECTION  09/12/2018   Procedure: ENDOSCOPIC MUCOSAL RESECTION;  Surgeon: Rush Landmark, Telford Nab., MD;  Location: [facility] ENDOSCOPY;  Service: Gastroenterology;;  . ENDOSCOPIC RETROGRADE CHOLANGIOPANCREATOGRAPHY (ERCP) WITH PROPOFOL N/A 04/01/2018   Procedure: ENDOSCOPIC RETROGRADE CHOLANGIOPANCREATOGRAPHY (ERCP) WITH PROPOFOL;  Surgeon: Irving Copas., MD;  Location: [place];  Service: Gastroenterology;   Laterality: N/A;  . ENDOSCOPIC RETROGRADE CHOLANGIOPANCREATOGRAPHY (ERCP) WITH PROPOFOL N/A 09/12/2018   Procedure: ENDOSCOPIC RETROGRADE CHOLANGIOPANCREATOGRAPHY (ERCP) WITH PROPOFOL;  Surgeon: Rush Landmark Telford Nab., MD;  Location: [place];  Service: Gastroenterology;  Laterality: N/A;  . ERCP N/A 11/28/2018   Procedure: ENDOSCOPIC RETROGRADE CHOLANGIOPANCREATOGRAPHY (ERCP) +EGD with spyglass;  Surgeon: Rush Landmark Telford Nab., MD;  Location: [place];  Service: Gastroenterology;  Laterality: N/A;  . ESOPHAGOGASTRODUODENOSCOPY  04/29/2010  . ESOPHAGOGASTRODUODENOSCOPY (EGD) WITH PROPOFOL N/A 04/01/2018   Procedure: ESOPHAGOGASTRODUODENOSCOPY (EGD) WITH PROPOFOL;  Surgeon: Rush Landmark Telford Nab., MD;  Location: [place];  Service: Gastroenterology;  Laterality: N/A;  . ESOPHAGOGASTRODUODENOSCOPY (EGD) WITH PROPOFOL N/A 09/12/2018   Procedure: ESOPHAGOGASTRODUODENOSCOPY (EGD) WITH PROPOFOL;  Surgeon: Rush Landmark Telford Nab., MD;  Location: [place];  Service: Gastroenterology;  Laterality: N/A;  . ESOPHAGOGASTRODUODENOSCOPY (EGD) WITH PROPOFOL N/A 11/28/2018   Procedure: ESOPHAGOGASTRODUODENOSCOPY (EGD) WITH PROPOFOL;  Surgeon: Rush Landmark Telford Nab., MD;  Location: [place];  Service: Gastroenterology;  Laterality: N/A;  . EUS  04/01/2018   Procedure: FULL UPPER ENDOSCOPIC ULTRASOUND (EUS) RADIAL;  Surgeon: Irving Copas., MD;  Location: [place];  Service: Gastroenterology;;  . EUS N/A 09/12/2018   Procedure: UPPER ENDOSCOPIC ULTRASOUND (EUS) RADIAL;  Surgeon: Irving Copas., MD;  Location: [place];  Service: Gastroenterology;  Laterality: N/A;  . FINE NEEDLE ASPIRATION  09/12/2018   Procedure: FINE NEEDLE ASPIRATION (FNA) LINEAR;  Surgeon: Irving Copas., MD;  Location: [place];  Service: Gastroenterology;;  . HEMOSTASIS CLIP PLACEMENT  09/12/2018   Procedure: HEMOSTASIS CLIP PLACEMENT;  Surgeon: Irving Copas., MD;  Location: [facility]  ENDOSCOPY;  Service: Gastroenterology;;  . I & D EXTREMITY Left 11/24/2016   Procedure: IRRIGATION AND DEBRIDEMENT LEFT HAND, THUMB, INDEX, MIDDLE, RING, AND SMALL FINGERS WITH RECONSTRUCTION;  Surgeon: Roseanne Kaufman, MD;  Location: [place];  Service: Orthopedics;  Laterality: Left;  . KNEE ARTHROSCOPY Left   . LEFT HEART CATH AND CORS/GRAFTS ANGIOGRAPHY N/A 01/05/2019   Procedure: LEFT HEART CATH AND CORS/GRAFTS ANGIOGRAPHY;  Surgeon: Nelva Bush, MD;  Location: [place] [facility];  Service: Cardiovascular;  Laterality: N/A;  . LUMBAR LAMINECTOMY     and fusion x 2  . NASAL SINUS SURGERY    . POPLITEAL SYNOVIAL CYST EXCISION    . REMOVAL OF STONES  04/01/2018   Procedure: REMOVAL OF STONES;  Surgeon: Rush Landmark Telford Nab., MD;  Location: [place];  Service: Gastroenterology;;  . REMOVAL OF STONES  09/12/2018   Procedure: REMOVAL OF STONES;  Surgeon: Irving Copas., MD;  Location: [place];  Service: Gastroenterology;;  . REMOVAL OF STONES  11/28/2018   Procedure: REMOVAL OF STONES;  Surgeon: Irving Copas., MD;  Location: [place];  Service: Gastroenterology;;  . Azzie Almas DILATION N/A 09/12/2018   Procedure: Azzie Almas DILATION;  Surgeon: Irving Copas., MD;  Location: [place];  Service: Gastroenterology;  Laterality: N/A;  . SAVORY DILATION N/A 11/28/2018   Procedure: SAVORY DILATION;  Surgeon: Rush Landmark Telford Nab., MD;  Location: [place];  Service: Gastroenterology;  Laterality: N/A;  . SPHINCTEROTOMY  04/01/2018   Procedure: SPHINCTEROTOMY;  Surgeon: Rush Landmark Telford Nab., MD;  Location: [place];  Service: Gastroenterology;;  . Bess Kinds CHOLANGIOSCOPY N/A 11/28/2018   Procedure: [id number] CHOLANGIOSCOPY;  Surgeon: Irving Copas., MD;  Location: [place];  Service: Gastroenterology;  Laterality: N/A;  . STENT REMOVAL  09/12/2018   Procedure: STENT REMOVAL;  Surgeon: Irving Copas., MD;  Location: [place];   Service: Gastroenterology;;  . Lavell Islam REMOVAL  11/28/2018   Procedure: STENT REMOVAL;  Surgeon: Irving Copas., MD;  Location: [place];  Service: Gastroenterology;;  . Lia Foyer LIFTING INJECTION  09/12/2018   Procedure: SUBMUCOSAL LIFTING INJECTION;  Surgeon: Irving Copas., MD;  Location: [place];  Service: Gastroenterology;;     Current Meds  Medication Sig  . acetaminophen (TYLENOL) 325 MG tablet Take 650 mg by mouth every 6 (six) hours as needed for mild pain.  [name] amLODipine (NORVASC) 2.5 MG tablet Take 2 tablets (5 mg total) by mouth daily.  [name] aspirin EC 81 MG tablet Take 81 mg by mouth at bedtime.  [name] BYSTOLIC 10 MG tablet Take 10 mg by mouth daily.  . clopidogrel (PLAVIX) 75 MG tablet Take 1 tablet (75 mg total) by mouth daily with breakfast.  . cyanocobalamin (,VITAMIN B-12,) 1000 MCG/ML injection INJECT 1ML INTO THE MUSCLE FOR 1 DOSE  . dicyclomine (BENTYL) 10 MG capsule Take 1 capsule (10 mg total) by mouth 4 (four) times daily -  before meals and at bedtime.  [name] ezetimibe (ZETIA) 10 MG tablet Take 1 tablet (10 mg total) by mouth daily.  . Fiber Complete TABS Take 1 tablet by mouth daily.  . fluticasone (FLONASE) 50 MCG/ACT nasal spray Place 1-2 sprays into both nostrils at bedtime as needed for allergies or rhinitis.  [name] isosorbide mononitrate (IMDUR) 60 MG 24 hr tablet Take 1 tablet (60 mg total) by mouth daily.  . Menthol, Topical Analgesic, (BENGAY EX) Apply 1 application topically daily as needed (pain).  . Multiple Vitamins-Minerals (MULTIVITAMIN WITH MINERALS) tablet Take 1 tablet by mouth daily.  . Naphazoline-Pheniramine (OPCON-A) 0.027-0.315 % SOLN Place 1 drop into both eyes daily as needed (itching eyes).  . NEEDLE, DISP, 25 G (B-D DISP NEEDLE 25GX1") 25G X 1" MISC Inject 1000 mcg into muscle once a month.  . nitroGLYCERIN (NITROSTAT) 0.4 MG SL tablet Place 1 tablet (0.4 mg total) under the tongue every 5 (five) minutes as needed for chest pain.    [name] omeprazole (PRILOSEC) 40 MG capsule Take 1 capsule (40 mg total) by mouth 2 (two) times daily after a meal.  . oxyCODONE-acetaminophen (PERCOCET) 10-325 MG tablet Take 1 tablet by mouth 2 (two) times daily as needed for pain.  . Probiotic Product (PROBIOTIC ADVANCED PO) Take 1 tablet by  mouth 2 (two) times daily.   [name] rOPINIRole (REQUIP) 2 MG tablet TAKE 1 TABLET BY MOUTH AT BEDTIME  . rosuvastatin (CRESTOR) 20 MG tablet Take 20 mg by mouth daily.  [name] saccharomyces boulardii (FLORASTOR) 250 MG capsule Take 1 capsule (250 mg total) by mouth 2 (two) times daily.  [name] venlafaxine (EFFEXOR) 75 MG tablet Take 1 tablet (75 mg total) by mouth daily.     Allergies:   Losartan potassium and Tape   Social History   Tobacco Use  . Smoking status: Former Smoker    Packs/day: 0.50    Years: 5.00    Pack years: 2.50    Types: Cigarettes    Quit date: 03/30/1976    Years since quitting: 43.0  . Smokeless tobacco: Never Used  Substance Use Topics  . Alcohol use: No    Alcohol/week: 0.0 standard drinks  . Drug use: No     Family Hx: The patient's family history includes Coronary artery disease in an other family member; Hypertension in his father; Melanoma in his mother; Stroke in his father. There is no history of Colon cancer, Esophageal cancer, Stomach cancer, Rectal cancer, Pancreatic cancer, Liver disease, or Inflammatory bowel disease.  ROS:   Please see the history of present illness.    Positive for fatigue No fevers chills night sweats No wheezing Continues to have mild shortness of breath with activity No anginal symptoms Previous "acid reflux "improved Positive for anemia Positive for hyperlipidemia Not using CPAP All other systems reviewed and are negative.   Prior CV studies:   The following studies were reviewed today:  CATH/PCI: 01/05/2019 Conclusions: 1. Severe native coronary artery disease, including 75% ostial LMCA stenosis, 50% mid LAD disease, sequential 90% and  70% proximal/mid LCx stenoses as well as 70% mid OM1 lesion, and sequential 70% ostial, 80% proximal, and 95% distal RCA stenoses. 2. Widely patent LIMA-LAD. 3. Chronically occluded SVG-OM1. 4. Subtotally occluded SVG-rPDA-rPL with 99% mid graft stenosis followed by extensive thrombus, some of which has embolized into the distal branches. 5. Upper normal left ventricular filling pressure. 6. Successful PCI to SVG-rPDA-rPL using overlapping Resolute Onyx 3.0 x 18 mm, 3.0 x 38 mm, and 3.0 x 38 mm drug-eluting stents with 0% residual stenosis and TIMI-3 flow.  Small amount of nonocclusive thrombus remained in the rPL branch at the end of the procedure.  Recommendations: 1. Indefinite dual antiplatelet therapy with aspirin and clopidogrel, as tolerated. 2. Medical therapy of protected LMCA and LCx disease.  If the patient has refractory symptoms, complex PCI of the ostial LMCA, LCx, and OM1 would need to be considered. 3. Aggressive secondary prevention. 4. Gentle post-catheterization hydration with close monitoring of renal function and hemoglobin, given contrast exposure and glycoprotein IIb/IIIa use during the intervention.  Intervention  Echo    01/05/2019 IMPRESSIONS  1. Left ventricular ejection fraction, by visual estimation, is 40 to 45%. The left ventricle has mild to moderately decreased function. There is mildly increased left ventricular hypertrophy. 2. Severe hypokinesis of the left ventricular, entire inferior wall and inferoseptal wall. 3. Elevated left atrial and left ventricular end-diastolic pressures. 4. Left ventricular diastolic parameters are consistent with Grade I diastolic dysfunction (impaired relaxation). 5. The left ventricle demonstrates regional wall motion abnormalities. 6. Global right ventricle has mildly reduced systolic function.The right ventricular size is normal. No increase in right ventricular wall thickness. 7. Left atrial size was moderately  dilated. 8. Right atrial size was normal. 9. The mitral valve is abnormal. Mild  mitral valve regurgitation. Posterior leaflet tethering is suspected. 10. The tricuspid valve is grossly normal. Tricuspid valve regurgitation is trivial. 11. The aortic valve is tricuspid. Aortic valve regurgitation is trivial. Mild aortic valve sclerosis without stenosis. 12. The pulmonic valve was grossly normal. Pulmonic valve regurgitation is not visualized. 13. Possible atrial shunt by color doppler. 14. Cannot exclude ASD/PFO. Consider transesophageal echocardiogram, if clinically indicated. 15. The inferior vena cava is normal in size with greater than 50% respiratory variability, suggesting right atrial pressure of 3 mmHg. 16. The left ventricular function has worsened.   Labs/Other Tests and Data Reviewed:    EKG:  An ECG dated 01/19/2019 was personally reviewed today and demonstrated:  Normal sinus rhythm 69 bpm, inferior infarct, T wave inversion 2 3 and F, V5 through V6.  No ectopy.  Normal intervals  Recent Labs: 04/04/2018: Magnesium 1.2 07/27/2018: Pro B Natriuretic peptide (BNP) 150.0; TSH 3.05 01/04/2019: ALT 25; B Natriuretic Peptide 1,392.1 01/19/2019: BUN 19; Creatinine, Ser 1.56; Hemoglobin 11.5; Platelets 229; Potassium 4.1; Sodium 140   Recent Lipid Panel Lab Results  Component Value Date/Time   CHOL 80 03/31/2018 05:08 AM   CHOL 99 (L) 12/24/2017 08:19 AM   CHOL 116 03/30/2013 10:53 AM   TRIG 72 03/31/2018 05:08 AM   TRIG 80 03/30/2013 10:53 AM   HDL 32 (L) 03/31/2018 05:08 AM   HDL 28 (L) 12/24/2017 08:19 AM   HDL 34 (L) 03/30/2013 10:53 AM   CHOLHDL 2.5 03/31/2018 05:08 AM   LDLCALC 34 03/31/2018 05:08 AM   LDLCALC 32 12/24/2017 08:19 AM   LDLCALC 66 03/30/2013 10:53 AM   LDLDIRECT 90.3 04/17/2008 09:11 AM    Wt Readings from Last 3 Encounters:  03/23/19 175 lb (79.4 kg)  02/07/19 175 lb (79.4 kg)  01/19/19 174 lb 6 oz (79.1 kg)     Objective:    Vital Signs:  Wt  175 lb (79.4 kg)   BMI 27.82 kg/m    Vital signs are later rechecked by his daughter with blood pressure 130/60, heart rate 62.  Since this was a virtual exam I could not physically examine the patient. He sounded fatigued No audible wheezing Heart rhythm was regular There with chest discomfort to palpation No abdominal tenderness No significant leg swelling    ASSESSMENT & PLAN:    1. CAD/status post CABG/recent non-STEMI: The patient recent hospitalization was thoroughly reviewed with him.  His most recent catheterization of December 17 showed severe native CAD with a widely patent LIMA to the LAD, chronically occluded SVG to the OM1, and a subtotally occluded vein graft supplying the PDA and PL vessel requiring overlapping Resolute Onyx 3.0x18, 3.0x38 and 3.0 x 38 mm drug-eluting stents resulting in return of TIMI-3 flow and 0 residual stenosis.  He had a small amount of nonocclusive thrombus in the right PL branch at the end of the procedure.  He has been without recurrent anginal symptoms.  He is on dual antiplatelet therapy with aspirin and Plavix.  I discussed with him since he had an ACS ideally this should be uninterrupted for 12 months.  However if he will require repeat biliary procedure depending upon his symptoms status if possible this can be arranged after 6 months depending upon his clinical status. 2. Ischemic cardiomyopathy: EF 40 to 45%.  Not able to physically examined today to optimally assess volume status.  Creatinine 1.56 in December 2020; depending upon subsequent laboratory, medication adjustment may be necessary. 3. Essential hypertension: Blood pressure currently controlled  on amlodipine and isosorbide. 4. Hyperlipidemia: Currently on rosuvastatin 20 mg and Zetia 10 mg. 5. Chronic GI issues: History of Barrett's esophagus without dysplasia, gallstone related pancreatitis now status post cholecystectomy, biliary stricture status post ERCP with choledocholithiasis  removal and stents in place followed by Dr. Rush Landmark.  If an additional procedure is necessary for stenting I recommend deferring this for at least 6 months following his recent ACS. 6. Obstructive sleep apnea: Currently not utilizing his CPAP therapy. 7. Pleural effusion: Recently followed by [name] 8. History of bladder cancer and urethral strictures, recently evaluated by Dr. Jeffie Pollock  COVID-19 Education: The signs and symptoms of COVID-19 were discussed with the patient and how to seek care for testing (follow up with PCP or arrange E-visit).  The importance of social distancing was discussed today.  Time:   Today, I have spent 30 minutes with the patient with telehealth technology discussing the above problems.     Medication Adjustments/Labs and Tests Ordered: Current medicines are reviewed at length with the patient today.  Concerns regarding medicines are outlined above.   Tests Ordered: No orders of the defined types were placed in this encounter.   Medication Changes: No orders of the defined types were placed in this encounter.   Follow Up:  In Person in 3 months Signed, [name], MD  03/23/2019 8:32 AM    [place]

## 2019-03-23 NOTE — Telephone Encounter (Signed)
LVM2CB to discuss AVS and see if pt was able to check BP/pulse. 3/4

## 2019-03-24 ENCOUNTER — Telehealth: Payer: Self-pay | Admitting: Cardiovascular Disease

## 2019-03-24 NOTE — Telephone Encounter (Signed)
Pt c/o BP issue: STAT if pt c/o blurred vision, one-sided weakness or slurred speech  1. What are your last 5 BP readings?   03/23/19 130/60 HR 62  2. Are you having any other symptoms (ex. Dizziness, headache, blurred vision, passed out)? No  3. What is your BP issue? [name] is calling stating Dr. Claiborne Billings requested the patient's BP and HR during Lukis's virtual appointment with him yesterday 03/23/19 and advised her to call in today with the readings. Please advise.

## 2019-03-24 NOTE — Telephone Encounter (Signed)
Will route to MD to make aware of BP-   Thanks!

## 2019-03-24 NOTE — Telephone Encounter (Signed)
LVM2CB on daughter's cell phone per dpr. Will mail AVS

## 2019-03-24 NOTE — Telephone Encounter (Signed)
BP and HR okay per Dr.Kelly. Will call pt to review and mail AVS

## 2019-03-24 NOTE — Patient Instructions (Signed)
Medication Instructions:  CONTINUE WITH CURRENT MEDICATIONS. NO CHANGES.  *If you need a refill on your cardiac medications before your next appointment, please call your pharmacy*     Follow-Up: At [facility], you and your health needs are our priority.  As part of our continuing mission to provide you with exceptional heart care, we have created designated Provider Care Teams.  These Care Teams include your primary Cardiologist (physician) and Advanced Practice Providers (APPs -  Physician Assistants and Nurse Practitioners) who all work together to provide you with the care you need, when you need it.  We recommend signing up for the patient portal called "MyChart".  Sign up information is provided on this After Visit Summary.  MyChart is used to connect with patients for Virtual Visits (Telemedicine).  Patients are able to view lab/test results, encounter notes, upcoming appointments, etc.  Non-urgent messages can be sent to your provider as well.   To learn more about what you can do with MyChart, go to [email or web address].    Your next appointment:   3 month(s)  The format for your next appointment:   In Person  Provider:   Thomas Kelly, MD    

## 2019-03-27 NOTE — Telephone Encounter (Signed)
Spoke with Daughter per DPR regarding Dr.Kelly's review of pts BP and HR. Notified that per MD these readings were fine and that we were not changing his treatment at this time. Reviewed AVS with daughter from [place] and notified that I mailed a copy to his home address. Notified Daughter that 3 month follow up had been made for 07/13/19 at 3:20pm. Daughter verbalized understanding and thankful for the call. No other questions at this time.

## 2019-03-27 NOTE — Telephone Encounter (Signed)
LVM2CB 3/8

## 2019-03-27 NOTE — Telephone Encounter (Signed)
Patient's daughter is returning call. Please call and advise.

## 2019-04-07 ENCOUNTER — Telehealth: Payer: Self-pay | Admitting: Internal Medicine

## 2019-04-07 NOTE — Telephone Encounter (Signed)
[name] from called and advised that [facility] received order on Thursday 04/06/2019 pt refused and request home health come back next week.Needing verbal orders.

## 2019-04-07 NOTE — Telephone Encounter (Signed)
Orders for what? They said they already have orders...Marland KitchenMarland Kitchen

## 2019-04-10 ENCOUNTER — Ambulatory Visit (HOSPITAL_COMMUNITY): Payer: Medicare Other | Attending: Cardiovascular Disease

## 2019-04-10 ENCOUNTER — Other Ambulatory Visit: Payer: Self-pay | Admitting: *Deleted

## 2019-04-10 ENCOUNTER — Other Ambulatory Visit: Payer: Self-pay

## 2019-04-10 ENCOUNTER — Other Ambulatory Visit: Payer: Self-pay | Admitting: Cardiovascular Disease

## 2019-04-10 DIAGNOSIS — I119 Hypertensive heart disease without heart failure: Secondary | ICD-10-CM | POA: Diagnosis not present

## 2019-04-10 DIAGNOSIS — J449 Chronic obstructive pulmonary disease, unspecified: Secondary | ICD-10-CM | POA: Insufficient documentation

## 2019-04-10 DIAGNOSIS — I252 Old myocardial infarction: Secondary | ICD-10-CM | POA: Insufficient documentation

## 2019-04-10 DIAGNOSIS — Z951 Presence of aortocoronary bypass graft: Secondary | ICD-10-CM | POA: Diagnosis not present

## 2019-04-10 DIAGNOSIS — I2510 Atherosclerotic heart disease of native coronary artery without angina pectoris: Secondary | ICD-10-CM | POA: Insufficient documentation

## 2019-04-10 DIAGNOSIS — E785 Hyperlipidemia, unspecified: Secondary | ICD-10-CM | POA: Insufficient documentation

## 2019-04-10 MED ORDER — CLOPIDOGREL BISULFATE 75 MG PO TABS: 75.0000 mg | ORAL_TABLET | Freq: Every day | ORAL | 3 refills | Status: DC

## 2019-04-10 NOTE — Telephone Encounter (Signed)
Patient walked in requesting rx for Plavix.  States pharmacy requested rx last week and was not completed.  Per chart review-patient had VV 03/23/2019 with Dr. Kelly-instructed to continue DAP-ASA and Plavix.    Rx sent to pharmacy.

## 2019-04-10 NOTE — Telephone Encounter (Signed)
*  STAT* If patient is at the pharmacy, call can be transferred to refill team.   1. Which medications need to be refilled? (please list name of each medication and dose if known)  clopidogrel (PLAVIX) tablet 75 mg   2. Which pharmacy/location (including street and city if local pharmacy) is medication to be sent to?  [place], [place] - [id number]  3. Do they need a 30 day or 90 day supply? 87   Daughter says she called the pharmacy last week to get this medication refilled. She was trying to prevent the patient from running out of medication. When she called back to the pharmacy she was told by the pharmacy that they had not heard a response from Dr. Claiborne Billings yet.  This medication is not in the patient's current medication list. It looks as if it was discontinued by Dr. Saunders Revel in December 2020. The daughter says that Dr. Claiborne Billings wants him to be on this every day. Please confirm what the patient needs to do  The patient is out of medication.

## 2019-04-11 ENCOUNTER — Telehealth: Payer: Self-pay | Admitting: Internal Medicine

## 2019-04-11 DIAGNOSIS — M170 Bilateral primary osteoarthritis of knee: Secondary | ICD-10-CM | POA: Diagnosis not present

## 2019-04-11 DIAGNOSIS — M545 Low back pain: Secondary | ICD-10-CM | POA: Diagnosis not present

## 2019-04-11 DIAGNOSIS — I252 Old myocardial infarction: Secondary | ICD-10-CM | POA: Diagnosis not present

## 2019-04-11 DIAGNOSIS — G8929 Other chronic pain: Secondary | ICD-10-CM | POA: Diagnosis not present

## 2019-04-11 DIAGNOSIS — I2510 Atherosclerotic heart disease of native coronary artery without angina pectoris: Secondary | ICD-10-CM | POA: Diagnosis not present

## 2019-04-11 MED ORDER — CLOPIDOGREL BISULFATE 75 MG PO TABS: 75.0000 mg | ORAL_TABLET | Freq: Every day | ORAL | 3 refills | Status: DC

## 2019-04-11 NOTE — Telephone Encounter (Signed)
[name] from [facility] is needing verbal orders for home health physical therapy --his frequency 1w4   [name] can be reached at [phone]

## 2019-04-12 NOTE — Telephone Encounter (Signed)
Verbal orders given to [facility]

## 2019-04-12 NOTE — Telephone Encounter (Signed)
Ok to place [facility] orders.

## 2019-04-19 DIAGNOSIS — I2510 Atherosclerotic heart disease of native coronary artery without angina pectoris: Secondary | ICD-10-CM | POA: Diagnosis not present

## 2019-04-19 DIAGNOSIS — M170 Bilateral primary osteoarthritis of knee: Secondary | ICD-10-CM | POA: Diagnosis not present

## 2019-04-19 DIAGNOSIS — I252 Old myocardial infarction: Secondary | ICD-10-CM | POA: Diagnosis not present

## 2019-04-19 DIAGNOSIS — G8929 Other chronic pain: Secondary | ICD-10-CM | POA: Diagnosis not present

## 2019-04-19 DIAGNOSIS — M545 Low back pain: Secondary | ICD-10-CM | POA: Diagnosis not present

## 2019-04-22 ENCOUNTER — Other Ambulatory Visit: Payer: Self-pay | Admitting: Cardiovascular Disease

## 2019-04-22 DIAGNOSIS — R0609 Other forms of dyspnea: Secondary | ICD-10-CM

## 2019-04-22 DIAGNOSIS — R0600 Dyspnea, unspecified: Secondary | ICD-10-CM

## 2019-04-26 DIAGNOSIS — G8929 Other chronic pain: Secondary | ICD-10-CM | POA: Diagnosis not present

## 2019-04-26 DIAGNOSIS — M545 Low back pain: Secondary | ICD-10-CM | POA: Diagnosis not present

## 2019-04-26 DIAGNOSIS — I2510 Atherosclerotic heart disease of native coronary artery without angina pectoris: Secondary | ICD-10-CM | POA: Diagnosis not present

## 2019-04-26 DIAGNOSIS — I252 Old myocardial infarction: Secondary | ICD-10-CM | POA: Diagnosis not present

## 2019-04-26 DIAGNOSIS — M170 Bilateral primary osteoarthritis of knee: Secondary | ICD-10-CM | POA: Diagnosis not present

## 2019-04-29 ENCOUNTER — Other Ambulatory Visit: Payer: Self-pay | Admitting: Cardiovascular Disease

## 2019-04-29 DIAGNOSIS — R0600 Dyspnea, unspecified: Secondary | ICD-10-CM

## 2019-04-29 DIAGNOSIS — R0609 Other forms of dyspnea: Secondary | ICD-10-CM

## 2019-05-03 DIAGNOSIS — M545 Low back pain: Secondary | ICD-10-CM | POA: Diagnosis not present

## 2019-05-03 DIAGNOSIS — M170 Bilateral primary osteoarthritis of knee: Secondary | ICD-10-CM | POA: Diagnosis not present

## 2019-05-03 DIAGNOSIS — G8929 Other chronic pain: Secondary | ICD-10-CM | POA: Diagnosis not present

## 2019-05-03 DIAGNOSIS — I2510 Atherosclerotic heart disease of native coronary artery without angina pectoris: Secondary | ICD-10-CM | POA: Diagnosis not present

## 2019-05-03 DIAGNOSIS — I252 Old myocardial infarction: Secondary | ICD-10-CM | POA: Diagnosis not present

## 2019-05-06 ENCOUNTER — Other Ambulatory Visit: Payer: Self-pay | Admitting: Cardiovascular Disease

## 2019-05-08 ENCOUNTER — Ambulatory Visit (INDEPENDENT_AMBULATORY_CARE_PROVIDER_SITE_OTHER): Payer: Medicare Other | Admitting: Family Medicine

## 2019-05-08 ENCOUNTER — Encounter: Payer: Self-pay | Admitting: Family Medicine

## 2019-05-08 ENCOUNTER — Telehealth: Payer: Self-pay | Admitting: Cardiovascular Disease

## 2019-05-08 ENCOUNTER — Other Ambulatory Visit: Payer: Self-pay

## 2019-05-08 VITALS — BP 130/68 | HR 66 | Temp 97.6°F | Wt 180.7 lb

## 2019-05-08 DIAGNOSIS — L989 Disorder of the skin and subcutaneous tissue, unspecified: Secondary | ICD-10-CM | POA: Diagnosis not present

## 2019-05-08 DIAGNOSIS — L988 Other specified disorders of the skin and subcutaneous tissue: Secondary | ICD-10-CM | POA: Diagnosis not present

## 2019-05-08 DIAGNOSIS — T148XXA Other injury of unspecified body region, initial encounter: Secondary | ICD-10-CM

## 2019-05-08 DIAGNOSIS — S40862A Insect bite (nonvenomous) of left upper arm, initial encounter: Secondary | ICD-10-CM

## 2019-05-08 DIAGNOSIS — W57XXXA Bitten or stung by nonvenomous insect and other nonvenomous arthropods, initial encounter: Secondary | ICD-10-CM | POA: Diagnosis not present

## 2019-05-08 DIAGNOSIS — S40861A Insect bite (nonvenomous) of right upper arm, initial encounter: Secondary | ICD-10-CM

## 2019-05-08 DIAGNOSIS — L570 Actinic keratosis: Secondary | ICD-10-CM

## 2019-05-08 DIAGNOSIS — S30860A Insect bite (nonvenomous) of lower back and pelvis, initial encounter: Secondary | ICD-10-CM

## 2019-05-08 DIAGNOSIS — T280XXA Burn of mouth and pharynx, initial encounter: Secondary | ICD-10-CM

## 2019-05-08 MED ORDER — DOXYCYCLINE HYCLATE 100 MG PO CAPS: 100.0000 mg | ORAL_CAPSULE | Freq: Two times a day (BID) | ORAL | 0 refills | Status: DC

## 2019-05-08 NOTE — Telephone Encounter (Signed)
Pt c/o swelling: STAT is pt has developed SOB within 24 hours  1) How much weight have you gained and in what time span? Not sure if he has gained weight  2) If swelling, where is the swelling located? Mostly in left leg  3) Are you currently taking a fluid pill? No  4) Are you currently SOB? no  5) Do you have a log of your daily weights (if so, list)?no  6) Have you gained 3 pounds in a day or 5 pounds in a week? no  7) Have you traveled recently? no   Patient's daughter calling stating the patient's legs have been swelling. She states it is mostly the left leg and that it has pitting edema. She states he has also been SOB since Saturday. She also states he had 20 ticks taken off him last week and is not sure if it is related. She states she is also going to call the patient's PCP. Please advise.

## 2019-05-08 NOTE — Telephone Encounter (Signed)
Spoke with patient's daughter about LE edema. She had about 20 ticks on his legs recently. He also has SOB and pitting edema. Unsure if there has been any weight gain.   She is currently on hold with PCP to see if patient can be evaluated - advised this is recommended due to # of tick bites, swelling, concerns for infection  Will route to primary cardiologist Dr. Claiborne Billings   Scheduled for OV with Curt Bears NP on 4/21 @ 3:45pm  Asked that daughter call back w/update if patient is able to be evaluated @ PCP office between now and then

## 2019-05-08 NOTE — Patient Instructions (Signed)
Tick Bite Information, Adult  Ticks are insects that draw blood for food. Most ticks live in shrubs and grassy areas. They climb onto people and animals that brush against the leaves and grasses that they rest on. Then they bite, attaching themselves to the skin. Most ticks are harmless, but some ticks carry germs that can spread to a person through a bite and cause a disease. To reduce your risk of getting a disease from a tick bite, it is important to take steps to prevent tick bites. It is also important to check for ticks after being outdoors. If you find that a tick has attached to you, watch for symptoms of disease. How can I prevent tick bites? Take these steps to help prevent tick bites when you are outdoors in an area where ticks are found:  Use insect repellent that has DEET (20% or higher), picaridin, or IR3535 in it. Use it on: ? Skin that is showing. ? The top of your boots. ? Your pant legs. ? Your sleeve cuffs.  For repellent products that contain permethrin, follow product instructions. Use these products on: ? Clothing. ? Gear. ? Boots. ? Tents.  Wear protective clothing. Long sleeves and long pants offer the best protection from ticks.  Wear light-colored clothing so you can see ticks more easily.  Tuck your pant legs into your socks.  If you go walking on a trail, stay in the middle of the trail so your skin, hair, and clothing do not touch the bushes.  Avoid walking through areas with long grass.  Check for ticks on your clothing, hair, and skin often while you are outside, and check again before you go inside. Make sure to check the places that ticks attach themselves most often. These places include the scalp, neck, armpits, waist, groin, and joint areas. Ticks that carry a disease called Lyme disease have to be attached to the skin for 24-48 hours. Checking for ticks every day will lessen your risk of this and other diseases.  When you come indoors, wash your  clothes and take a shower or a bath right away. Dry your clothes in a dryer on high heat for at least 60 minutes. This will kill any ticks in your clothes. What is the proper way to remove a tick? If you find a tick on your body, remove it as soon as possible. Removing a tick sooner rather than later can prevent germs from passing from the tick to your body. To remove a tick that is crawling on your skin but has not bitten:  Go outdoors and brush the tick off.  Remove the tick with tape or a lint roller. To remove a tick that is attached to your skin:  Wash your hands.  If you have latex gloves, put them on.  Use tweezers, curved forceps, or a tick-removal tool to gently grasp the tick as close to your skin and the tick's head as possible.  Gently pull with steady, upward pressure until the tick lets go. When removing the tick: ? Take care to keep the tick's head attached to its body. ? Do not twist or jerk the tick. This can make the tick's head or mouth break off. ? Do not squeeze or crush the tick's body. This could force disease-carrying fluids from the tick into your body. Do not try to remove a tick with heat, alcohol, petroleum jelly, or fingernail polish. Using these methods can cause the tick to salivate and regurgitate into   your bloodstream, increasing your risk of getting a disease. What should I do after removing a tick?  Clean the bite area with soap and water, rubbing alcohol, or an iodine scrub.  If an antiseptic cream or ointment is available, apply a small amount to the bite site.  Wash and disinfect any instruments that you used to remove the tick. How should I dispose of a tick? To dispose of a live tick, use one of these methods:  Place it in rubbing alcohol.  Place it in a sealed bag or container.  Wrap it tightly in tape.  Flush it down the toilet. Contact a health care provider if:  You have symptoms of a disease after a tick bite. Symptoms of a  tick-borne disease can occur from moments after the tick bites to up to 30 days after a tick is removed. Symptoms include: ? Muscle, joint, or bone pain. ? Difficulty walking or moving your legs. ? Numbness in the legs. ? Paralysis. ? Red rash around the tick bite area that is shaped like a target or a "bull's-eye." ? Redness and swelling in the area of the tick bite. ? Fever. ? Repeated vomiting. ? Diarrhea. ? Weight loss. ? Tender, swollen lymph glands. ? Shortness of breath. ? Cough. ? Pain in the abdomen. ? Headache. ? Abnormal tiredness. ? A change in your level of consciousness. ? Confusion. Get help right away if:  You are not able to remove a tick.  A part of a tick breaks off and gets stuck in your skin.  Your symptoms get worse. Summary  Ticks may carry germs that can spread to a person through a bite and cause disease.  Wear protective clothing and use insect repellent to prevent tick bites. Follow product instructions.  If you find a tick on your body, remove it as soon as possible. If the tick is attached, do not try to remove with heat, alcohol, petroleum jelly, or fingernail polish.  Remove the attached tick using tweezers, curved forceps, or a tick-removal tool. Gently pull with steady, upward pressure until the tick lets go. Do not twist or jerk the tick. Do not squeeze or crush the tick's body.  If you have symptoms after being bitten by a tick, contact a health care provider. This information is not intended to replace advice given to you by your health care provider. Make sure you discuss any questions you have with your health care provider. Document Revised: 12/18/2016 Document Reviewed: 10/18/2015 [employer]vier Patient Education  2020 [employer]  

## 2019-05-08 NOTE — Progress Notes (Addendum)
Subjective:     Patient ID: [name], male   DOB: [birth year], 79 y.o.   MRN: [id number]  HPI [name] is seen for the following issues  He states last week he was out doing some mowing and yard work and got into some ticks and he estimates he had as many as 35-50 bites.  With the assistance of his daughter he removed at least 43 ticks and has only most these were deer ticks.  He has not had any fever, headaches, or erythema migrans type rash.  He has several sites that are pruritic and punctate.  No petechiae.  He states he had multiple medical complications last year including cholecystectomy followed by MI and pneumonia  He has had some chronic dyspnea since his cardiac event and is followed closely by cardiology.  No recent chest pains.  He describes some burning mouth symptoms that have been present for several months possibly over a year.  He does have history of B12 deficiency and gets regular B12 injections.  He had B12 last year that was over 600.  He is not sure which type of toothpaste he uses  Finally, he has a couple of irritated skin lesions on his neck that he would like to have treated if possible.  He states he has had similar lesions treated by dermatology in past with liquid nitrogen.  These are both irritated because of shaving.  One is on the left side of neck and the others on the right side.  He has not noted any bleeding.  They sometimes itch.  Past Medical History:  Diagnosis Date  . Allergy   . B12 deficiency anemia   . Blood transfusion without reported diagnosis   . CAD (coronary artery disease)   . Cancer ([place])    bladder-   . Colon polyps   . COPD (chronic obstructive pulmonary disease) ([place])   . Depression   . Esophagus, Barrett's   . GERD (gastroesophageal reflux disease)   . History of bladder cancer    Bladder cancer "8 times"  . History of hiatal hernia   . Hyperlipidemia   . Hypertension   . Localized osteoarthrosis, lower leg   .  Restless leg syndrome   . Sleep apnea    does not wear cpap  . Stenosis of esophagus    Past Surgical History:  Procedure Laterality Date  . BILIARY BRUSHING  04/01/2018   Procedure: BILIARY BRUSHING;  Surgeon: Rush Landmark Telford Nab., MD;  Location: [place];  Service: Gastroenterology;;  . BILIARY BRUSHING  09/12/2018   Procedure: BILIARY BRUSHING;  Surgeon: Irving Copas., MD;  Location: [place];  Service: Gastroenterology;;  . BILIARY BRUSHING  11/28/2018   Procedure: BILIARY BRUSHING;  Surgeon: Irving Copas., MD;  Location: [place];  Service: Gastroenterology;;  . BILIARY DILATION  09/12/2018   Procedure: BILIARY DILATION;  Surgeon: Irving Copas., MD;  Location: [place];  Service: Gastroenterology;;  . BILIARY DILATION  11/28/2018   Procedure: BILIARY DILATION;  Surgeon: Irving Copas., MD;  Location: [place];  Service: Gastroenterology;;  . BILIARY STENT PLACEMENT  04/01/2018   Procedure: BILIARY STENT PLACEMENT;  Surgeon: Irving Copas., MD;  Location: [place];  Service: Gastroenterology;;  . BILIARY STENT PLACEMENT  09/12/2018   Procedure: BILIARY STENT PLACEMENT;  Surgeon: Irving Copas., MD;  Location: [place];  Service: Gastroenterology;;  . BILIARY STENT PLACEMENT  11/28/2018   Procedure: BILIARY STENT PLACEMENT;  Surgeon: Irving Copas.,  MD;  Location: [place];  Service: Gastroenterology;;  . BIOPSY  04/01/2018   Procedure: BIOPSY;  Surgeon: Irving Copas., MD;  Location: [place];  Service: Gastroenterology;;  . BIOPSY  09/12/2018   Procedure: BIOPSY;  Surgeon: Irving Copas., MD;  Location: [place];  Service: Gastroenterology;;  . BIOPSY  11/28/2018   Procedure: BIOPSY;  Surgeon: Irving Copas., MD;  Location: [place];  Service: Gastroenterology;;  . bladder cancer      x 8 cystoscopy  . CERVICAL DISCECTOMY     ACDF  . COLONOSCOPY   11/17/2005   normal   . CORONARY ARTERY BYPASS GRAFT     x4  . CORONARY STENT INTERVENTION N/A 01/05/2019   Procedure: CORONARY STENT INTERVENTION;  Surgeon: Nelva Bush, MD;  Location: [place] [facility];  Service: Cardiovascular;  Laterality: N/A;  . ENDOSCOPIC MUCOSAL RESECTION  09/12/2018   Procedure: ENDOSCOPIC MUCOSAL RESECTION;  Surgeon: Rush Landmark, Telford Nab., MD;  Location: [facility] ENDOSCOPY;  Service: Gastroenterology;;  . ENDOSCOPIC RETROGRADE CHOLANGIOPANCREATOGRAPHY (ERCP) WITH PROPOFOL N/A 04/01/2018   Procedure: ENDOSCOPIC RETROGRADE CHOLANGIOPANCREATOGRAPHY (ERCP) WITH PROPOFOL;  Surgeon: Irving Copas., MD;  Location: [place];  Service: Gastroenterology;  Laterality: N/A;  . ENDOSCOPIC RETROGRADE CHOLANGIOPANCREATOGRAPHY (ERCP) WITH PROPOFOL N/A 09/12/2018   Procedure: ENDOSCOPIC RETROGRADE CHOLANGIOPANCREATOGRAPHY (ERCP) WITH PROPOFOL;  Surgeon: Rush Landmark Telford Nab., MD;  Location: [place];  Service: Gastroenterology;  Laterality: N/A;  . ERCP N/A 11/28/2018   Procedure: ENDOSCOPIC RETROGRADE CHOLANGIOPANCREATOGRAPHY (ERCP) +EGD with spyglass;  Surgeon: Rush Landmark Telford Nab., MD;  Location: [place];  Service: Gastroenterology;  Laterality: N/A;  . ESOPHAGOGASTRODUODENOSCOPY  04/29/2010  . ESOPHAGOGASTRODUODENOSCOPY (EGD) WITH PROPOFOL N/A 04/01/2018   Procedure: ESOPHAGOGASTRODUODENOSCOPY (EGD) WITH PROPOFOL;  Surgeon: Rush Landmark Telford Nab., MD;  Location: [place];  Service: Gastroenterology;  Laterality: N/A;  . ESOPHAGOGASTRODUODENOSCOPY (EGD) WITH PROPOFOL N/A 09/12/2018   Procedure: ESOPHAGOGASTRODUODENOSCOPY (EGD) WITH PROPOFOL;  Surgeon: Rush Landmark Telford Nab., MD;  Location: [place];  Service: Gastroenterology;  Laterality: N/A;  . ESOPHAGOGASTRODUODENOSCOPY (EGD) WITH PROPOFOL N/A 11/28/2018   Procedure: ESOPHAGOGASTRODUODENOSCOPY (EGD) WITH PROPOFOL;  Surgeon: Rush Landmark Telford Nab., MD;  Location: [place];  Service:  Gastroenterology;  Laterality: N/A;  . EUS  04/01/2018   Procedure: FULL UPPER ENDOSCOPIC ULTRASOUND (EUS) RADIAL;  Surgeon: Irving Copas., MD;  Location: [place];  Service: Gastroenterology;;  . EUS N/A 09/12/2018   Procedure: UPPER ENDOSCOPIC ULTRASOUND (EUS) RADIAL;  Surgeon: Irving Copas., MD;  Location: [place];  Service: Gastroenterology;  Laterality: N/A;  . FINE NEEDLE ASPIRATION  09/12/2018   Procedure: FINE NEEDLE ASPIRATION (FNA) LINEAR;  Surgeon: Irving Copas., MD;  Location: [place];  Service: Gastroenterology;;  . HEMOSTASIS CLIP PLACEMENT  09/12/2018   Procedure: HEMOSTASIS CLIP PLACEMENT;  Surgeon: Irving Copas., MD;  Location: [facility] ENDOSCOPY;  Service: Gastroenterology;;  . I & D EXTREMITY Left 11/24/2016   Procedure: IRRIGATION AND DEBRIDEMENT LEFT HAND, THUMB, INDEX, MIDDLE, RING, AND SMALL FINGERS WITH RECONSTRUCTION;  Surgeon: Roseanne Kaufman, MD;  Location: [place];  Service: Orthopedics;  Laterality: Left;  . KNEE ARTHROSCOPY Left   . LEFT HEART CATH AND CORS/GRAFTS ANGIOGRAPHY N/A 01/05/2019   Procedure: LEFT HEART CATH AND CORS/GRAFTS ANGIOGRAPHY;  Surgeon: Nelva Bush, MD;  Location: [place] [facility];  Service: Cardiovascular;  Laterality: N/A;  . LUMBAR LAMINECTOMY     and fusion x 2  . NASAL SINUS SURGERY    . POPLITEAL SYNOVIAL CYST EXCISION    . REMOVAL OF STONES  04/01/2018  Procedure: REMOVAL OF STONES;  Surgeon: Rush Landmark Telford Nab., MD;  Location: [place];  Service: Gastroenterology;;  . REMOVAL OF STONES  09/12/2018   Procedure: REMOVAL OF STONES;  Surgeon: Irving Copas., MD;  Location: [place];  Service: Gastroenterology;;  . REMOVAL OF STONES  11/28/2018   Procedure: REMOVAL OF STONES;  Surgeon: Irving Copas., MD;  Location: [place];  Service: Gastroenterology;;  . Azzie Almas DILATION N/A 09/12/2018   Procedure: Azzie Almas DILATION;  Surgeon: Irving Copas., MD;   Location: [place];  Service: Gastroenterology;  Laterality: N/A;  . SAVORY DILATION N/A 11/28/2018   Procedure: SAVORY DILATION;  Surgeon: Rush Landmark Telford Nab., MD;  Location: [place];  Service: Gastroenterology;  Laterality: N/A;  . SPHINCTEROTOMY  04/01/2018   Procedure: SPHINCTEROTOMY;  Surgeon: Rush Landmark Telford Nab., MD;  Location: [place];  Service: Gastroenterology;;  . Bess Kinds CHOLANGIOSCOPY N/A 11/28/2018   Procedure: [id number] CHOLANGIOSCOPY;  Surgeon: Irving Copas., MD;  Location: [place];  Service: Gastroenterology;  Laterality: N/A;  . STENT REMOVAL  09/12/2018   Procedure: STENT REMOVAL;  Surgeon: Irving Copas., MD;  Location: [place];  Service: Gastroenterology;;  . Lavell Islam REMOVAL  11/28/2018   Procedure: STENT REMOVAL;  Surgeon: Irving Copas., MD;  Location: [place];  Service: Gastroenterology;;  . Lia Foyer LIFTING INJECTION  09/12/2018   Procedure: SUBMUCOSAL LIFTING INJECTION;  Surgeon: Irving Copas., MD;  Location: [place];  Service: Gastroenterology;;    reports that he quit smoking about 43 years ago. His smoking use included cigarettes. He has a 2.50 pack-year smoking history. He has never used smokeless tobacco. He reports that he does not drink alcohol or use drugs. family history includes Coronary artery disease in an other family member; Hypertension in his father; Melanoma in his mother; Stroke in his father. Allergies  Allergen Reactions  . Losartan Potassium Other (See Comments)    Hyperkalemia  . Tape Itching and Rash    reddened skin        Review of Systems  Constitutional: Negative for chills and fatigue.  HENT: Negative for sore throat.   Eyes: Negative for visual disturbance.  Respiratory: Negative for cough, chest tightness and shortness of breath.   Cardiovascular: Negative for chest pain, palpitations and leg swelling.  Musculoskeletal: Negative for arthralgias.  Skin:  Negative for rash.  Neurological: Negative for dizziness, syncope, weakness, light-headedness and headaches.       Objective:   Physical Exam Vitals reviewed.  Constitutional:      Appearance: Normal appearance.  HENT:     Mouth/Throat:     Comments: Oropharynx clear Cardiovascular:     Rate and Rhythm: Normal rate and regular rhythm.  Pulmonary:     Effort: Pulmonary effort is normal.     Breath sounds: Normal breath sounds.  Musculoskeletal:     Right lower leg: No edema.     Left lower leg: No edema.  Skin:    Comments: He has multiple small punctate areas on his trunk and extremities at sites where he is pulled off ticks.  There is no evidence for erythema migrans rash.  He has well demarcated scaly hyperkeratotic lesions involving left side of neck and right side of neck.  Both are about 3 to 4 mm diameter.  No ulceration.    Neurological:     Mental Status: He is alert.        Assessment:     #1 multiple tick bites.  He is showing evidence of local  allergic reaction but fortunately no signs or symptoms of Lyme disease or [place] spotted fever at this time  #2 irritated seborrheic keratoses versus actinic keratoses of the neck  #3 burning mouth symptoms.  He does have history of B12 deficiency but is on replacement    Plan:     -Given the sheer number of bites we decided to go ahead and treat with doxycycline 100 mg twice daily for 10 days.  We reviewed signs and symptoms of Lyme disease and [place] spotted fever.  He is looking at measures to reduce tick population on his property  -We recommend he check his toothpaste to make sure this does not contain sodium lauryl sulfate.  Mouth symptoms are relatively minor.  Consider follow-up B12 level at next follow-up with primary  -Patient requesting treatment with liquid nitrogen for irritated keratoses of the neck.  We discussed risk including blistering, pain, low risk of infection and patient consented.  We  treated 1 lesion left side of neck and 1 right side of neck and patient tolerated well.  Keep clean with soap and water and follow-up for any signs of secondary infection  Eulas Post MD [facility] at [facility]  Addendum:  Tick bite locations included right lower back, upper back bilaterally, right arm. Left arm.    Eulas Post MD [facility] at [facility]

## 2019-05-10 ENCOUNTER — Other Ambulatory Visit: Payer: Self-pay

## 2019-05-10 ENCOUNTER — Ambulatory Visit (INDEPENDENT_AMBULATORY_CARE_PROVIDER_SITE_OTHER): Payer: Medicare Other | Admitting: Adult Health

## 2019-05-10 ENCOUNTER — Encounter: Payer: Self-pay | Admitting: Adult Health

## 2019-05-10 VITALS — BP 130/64 | HR 66 | Ht 69.0 in | Wt 180.8 lb

## 2019-05-10 DIAGNOSIS — M170 Bilateral primary osteoarthritis of knee: Secondary | ICD-10-CM | POA: Diagnosis not present

## 2019-05-10 DIAGNOSIS — M545 Low back pain: Secondary | ICD-10-CM | POA: Diagnosis not present

## 2019-05-10 DIAGNOSIS — I5032 Chronic diastolic (congestive) heart failure: Secondary | ICD-10-CM

## 2019-05-10 DIAGNOSIS — I358 Other nonrheumatic aortic valve disorders: Secondary | ICD-10-CM

## 2019-05-10 DIAGNOSIS — I252 Old myocardial infarction: Secondary | ICD-10-CM | POA: Diagnosis not present

## 2019-05-10 DIAGNOSIS — G4733 Obstructive sleep apnea (adult) (pediatric): Secondary | ICD-10-CM

## 2019-05-10 DIAGNOSIS — R0602 Shortness of breath: Secondary | ICD-10-CM

## 2019-05-10 DIAGNOSIS — G8929 Other chronic pain: Secondary | ICD-10-CM | POA: Diagnosis not present

## 2019-05-10 DIAGNOSIS — I2510 Atherosclerotic heart disease of native coronary artery without angina pectoris: Secondary | ICD-10-CM | POA: Diagnosis not present

## 2019-05-10 DIAGNOSIS — W57XXXS Bitten or stung by nonvenomous insect and other nonvenomous arthropods, sequela: Secondary | ICD-10-CM

## 2019-05-10 DIAGNOSIS — E7800 Pure hypercholesterolemia, unspecified: Secondary | ICD-10-CM

## 2019-05-10 DIAGNOSIS — Z79899 Other long term (current) drug therapy: Secondary | ICD-10-CM

## 2019-05-10 MED ORDER — HYDROCHLOROTHIAZIDE 12.5 MG PO CAPS: 12.5000 mg | ORAL_CAPSULE | ORAL | 2 refills | Status: DC

## 2019-05-10 NOTE — Telephone Encounter (Signed)
Dr.Kelly aware.  °

## 2019-05-10 NOTE — Progress Notes (Signed)
Cardiology Office Note   Date:  05/10/2019   ID:  Maasai, Turturro Apr 06, 1940, MRN [id number]  PCP:  Isaac Bliss, Rayford Halsted, MD  Cardiologist:  Dr. Claiborne Billings CC: Shortness of breath    History of Present Illness: [name] is a 79 y.o. male who presents for ongoing assessment and management of of CAD who underwent CABG revascularization surgery in 2001 with a LIMA to his LAD, vein to the marginal, vein to the RCA. Catheterization in 2004 showed patent grafts. An echo Doppler study in August 2012 revealed an ejection fraction of 40-45% with moderate septal hypokinesis with grade 1 diastolic dysfunction as well as aortic valve sclerosis. Additional problems include GERD, mild emphysema, as well as obstructive sleep apnea. He no longer utilizes CPAP.   He undewent a five-year follow-up nuclear perfusion study in November 2016. Ejection fraction was 54%. He had normal perfusion. 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 He admits to arthralgias without myalgias. He has issues with his in his legs and his peripheral neuropathy.  December 23, 2017 an echo Doppler study showed an EF of 60 to 65% with grade 1 diastolic dysfunction and mild aortic valve stenosis. A Lexiscan Myoview study on January 04, 2018 was low risk and demonstrated normal perfusion without scar or ischemia. EF was 51%.  He washospitalized from March 11 through March 18, 2020presenting with abdominal discomfort,. He was ultimately had positive blood cultures for coli to have sepsis and colitis. An MRCP showed acute pancreatitis, mild gallbladder distention with peri-cholecystic fluid and gallbladder wall thickening. School he underwent ERCP on April 01, 2018 which showed stricture and sludge without stone he underwent sphincterotomy and stent placement.  Last seen by Dr. Claiborne Billings via telemedicine 03/23/2019. He was stable from CV standpoint. If he  required any biliary procedures he would have to wait 6 months to continue DAPT for one year.  He called our office on 05/08/2019 for complaints of leg swelling and shortness of breath. Also reported that ticks were taken off within the last week. He is here for follow up evaluation.   He comes today with multiple somatic complaints.  He states that his breathing has gotten worse over the last couple of weeks.  He states he is always short of breath and has been for years.  He also has noticed that when he is on his feet a lot he has some edema in his legs.  He also complains of chronic pain in his legs for which she takes oxycodone 3 times a day.  He denies any chest pain.  He states he is unable to sleep well due to his chronic pain.  He does not wear CPAP with history of OSA.  Past Medical History:  Diagnosis Date  . Allergy   . B12 deficiency anemia   . Blood transfusion without reported diagnosis   . CAD (coronary artery disease)   . Cancer ([place])    bladder-   . Colon polyps   . COPD (chronic obstructive pulmonary disease) ([place])   . Depression   . Esophagus, Barrett's   . GERD (gastroesophageal reflux disease)   . History of bladder cancer    Bladder cancer "8 times"  . History of hiatal hernia   . Hyperlipidemia   . Hypertension   . Localized osteoarthrosis, lower leg   . Restless leg syndrome   . Sleep apnea    does not wear  cpap  . Stenosis of esophagus     Past Surgical History:  Procedure Laterality Date  . BILIARY BRUSHING  04/01/2018   Procedure: BILIARY BRUSHING;  Surgeon: Rush Landmark Telford Nab., MD;  Location: [place];  Service: Gastroenterology;;  . BILIARY BRUSHING  09/12/2018   Procedure: BILIARY BRUSHING;  Surgeon: Irving Copas., MD;  Location: [place];  Service: Gastroenterology;;  . BILIARY BRUSHING  11/28/2018   Procedure: BILIARY BRUSHING;  Surgeon: Irving Copas., MD;  Location: [place];  Service: Gastroenterology;;  .  BILIARY DILATION  09/12/2018   Procedure: BILIARY DILATION;  Surgeon: Irving Copas., MD;  Location: [place];  Service: Gastroenterology;;  . BILIARY DILATION  11/28/2018   Procedure: BILIARY DILATION;  Surgeon: Irving Copas., MD;  Location: [place];  Service: Gastroenterology;;  . BILIARY STENT PLACEMENT  04/01/2018   Procedure: BILIARY STENT PLACEMENT;  Surgeon: Irving Copas., MD;  Location: [place];  Service: Gastroenterology;;  . BILIARY STENT PLACEMENT  09/12/2018   Procedure: BILIARY STENT PLACEMENT;  Surgeon: Irving Copas., MD;  Location: [place];  Service: Gastroenterology;;  . BILIARY STENT PLACEMENT  11/28/2018   Procedure: BILIARY STENT PLACEMENT;  Surgeon: Irving Copas., MD;  Location: [place];  Service: Gastroenterology;;  . BIOPSY  04/01/2018   Procedure: BIOPSY;  Surgeon: Irving Copas., MD;  Location: [place];  Service: Gastroenterology;;  . BIOPSY  09/12/2018   Procedure: BIOPSY;  Surgeon: Irving Copas., MD;  Location: [place];  Service: Gastroenterology;;  . BIOPSY  11/28/2018   Procedure: BIOPSY;  Surgeon: Irving Copas., MD;  Location: [facility] ENDOSCOPY;  Service: Gastroenterology;;  . bladder cancer      x 8 cystoscopy  . CERVICAL DISCECTOMY     ACDF  . COLONOSCOPY  11/17/2005   normal   . CORONARY ARTERY BYPASS GRAFT     x4  . CORONARY STENT INTERVENTION N/A 01/05/2019   Procedure: CORONARY STENT INTERVENTION;  Surgeon: Nelva Bush, MD;  Location: [place] [facility];  Service: Cardiovascular;  Laterality: N/A;  . ENDOSCOPIC MUCOSAL RESECTION  09/12/2018   Procedure: ENDOSCOPIC MUCOSAL RESECTION;  Surgeon: Rush Landmark, Telford Nab., MD;  Location: [facility] ENDOSCOPY;  Service: Gastroenterology;;  . ENDOSCOPIC RETROGRADE CHOLANGIOPANCREATOGRAPHY (ERCP) WITH PROPOFOL N/A 04/01/2018   Procedure: ENDOSCOPIC RETROGRADE CHOLANGIOPANCREATOGRAPHY (ERCP) WITH PROPOFOL;  Surgeon:  Irving Copas., MD;  Location: [place];  Service: Gastroenterology;  Laterality: N/A;  . ENDOSCOPIC RETROGRADE CHOLANGIOPANCREATOGRAPHY (ERCP) WITH PROPOFOL N/A 09/12/2018   Procedure: ENDOSCOPIC RETROGRADE CHOLANGIOPANCREATOGRAPHY (ERCP) WITH PROPOFOL;  Surgeon: Rush Landmark Telford Nab., MD;  Location: [place];  Service: Gastroenterology;  Laterality: N/A;  . ERCP N/A 11/28/2018   Procedure: ENDOSCOPIC RETROGRADE CHOLANGIOPANCREATOGRAPHY (ERCP) +EGD with spyglass;  Surgeon: Rush Landmark Telford Nab., MD;  Location: [place];  Service: Gastroenterology;  Laterality: N/A;  . ESOPHAGOGASTRODUODENOSCOPY  04/29/2010  . ESOPHAGOGASTRODUODENOSCOPY (EGD) WITH PROPOFOL N/A 04/01/2018   Procedure: ESOPHAGOGASTRODUODENOSCOPY (EGD) WITH PROPOFOL;  Surgeon: Rush Landmark Telford Nab., MD;  Location: [place];  Service: Gastroenterology;  Laterality: N/A;  . ESOPHAGOGASTRODUODENOSCOPY (EGD) WITH PROPOFOL N/A 09/12/2018   Procedure: ESOPHAGOGASTRODUODENOSCOPY (EGD) WITH PROPOFOL;  Surgeon: Rush Landmark Telford Nab., MD;  Location: [place];  Service: Gastroenterology;  Laterality: N/A;  . ESOPHAGOGASTRODUODENOSCOPY (EGD) WITH PROPOFOL N/A 11/28/2018   Procedure: ESOPHAGOGASTRODUODENOSCOPY (EGD) WITH PROPOFOL;  Surgeon: Rush Landmark Telford Nab., MD;  Location: [place];  Service: Gastroenterology;  Laterality: N/A;  . EUS  04/01/2018   Procedure: FULL UPPER ENDOSCOPIC ULTRASOUND (EUS) RADIAL;  Surgeon: Rush Landmark Telford Nab., MD;  Location: [facility] ENDOSCOPY;  Service: Gastroenterology;;  . EUS N/A 09/12/2018   Procedure: UPPER ENDOSCOPIC ULTRASOUND (EUS) RADIAL;  Surgeon: Irving Copas., MD;  Location: [place];  Service: Gastroenterology;  Laterality: N/A;  . FINE NEEDLE ASPIRATION  09/12/2018   Procedure: FINE NEEDLE ASPIRATION (FNA) LINEAR;  Surgeon: Irving Copas., MD;  Location: [place];  Service: Gastroenterology;;  . HEMOSTASIS CLIP PLACEMENT  09/12/2018    Procedure: HEMOSTASIS CLIP PLACEMENT;  Surgeon: Irving Copas., MD;  Location: [facility] ENDOSCOPY;  Service: Gastroenterology;;  . I & D EXTREMITY Left 11/24/2016   Procedure: IRRIGATION AND DEBRIDEMENT LEFT HAND, THUMB, INDEX, MIDDLE, RING, AND SMALL FINGERS WITH RECONSTRUCTION;  Surgeon: Roseanne Kaufman, MD;  Location: [place];  Service: Orthopedics;  Laterality: Left;  . KNEE ARTHROSCOPY Left   . LEFT HEART CATH AND CORS/GRAFTS ANGIOGRAPHY N/A 01/05/2019   Procedure: LEFT HEART CATH AND CORS/GRAFTS ANGIOGRAPHY;  Surgeon: Nelva Bush, MD;  Location: [place] [facility];  Service: Cardiovascular;  Laterality: N/A;  . LUMBAR LAMINECTOMY     and fusion x 2  . NASAL SINUS SURGERY    . POPLITEAL SYNOVIAL CYST EXCISION    . REMOVAL OF STONES  04/01/2018   Procedure: REMOVAL OF STONES;  Surgeon: Rush Landmark Telford Nab., MD;  Location: [place];  Service: Gastroenterology;;  . REMOVAL OF STONES  09/12/2018   Procedure: REMOVAL OF STONES;  Surgeon: Irving Copas., MD;  Location: [place];  Service: Gastroenterology;;  . REMOVAL OF STONES  11/28/2018   Procedure: REMOVAL OF STONES;  Surgeon: Irving Copas., MD;  Location: [place];  Service: Gastroenterology;;  . Azzie Almas DILATION N/A 09/12/2018   Procedure: Azzie Almas DILATION;  Surgeon: Irving Copas., MD;  Location: [place];  Service: Gastroenterology;  Laterality: N/A;  . SAVORY DILATION N/A 11/28/2018   Procedure: SAVORY DILATION;  Surgeon: Rush Landmark Telford Nab., MD;  Location: [place];  Service: Gastroenterology;  Laterality: N/A;  . SPHINCTEROTOMY  04/01/2018   Procedure: SPHINCTEROTOMY;  Surgeon: Rush Landmark Telford Nab., MD;  Location: [place];  Service: Gastroenterology;;  . Bess Kinds CHOLANGIOSCOPY N/A 11/28/2018   Procedure: [id number] CHOLANGIOSCOPY;  Surgeon: Irving Copas., MD;  Location: [place];  Service: Gastroenterology;  Laterality: N/A;  . STENT REMOVAL  09/12/2018    Procedure: STENT REMOVAL;  Surgeon: Irving Copas., MD;  Location: [place];  Service: Gastroenterology;;  . Lavell Islam REMOVAL  11/28/2018   Procedure: STENT REMOVAL;  Surgeon: Irving Copas., MD;  Location: [place];  Service: Gastroenterology;;  . Lia Foyer LIFTING INJECTION  09/12/2018   Procedure: SUBMUCOSAL LIFTING INJECTION;  Surgeon: Irving Copas., MD;  Location: [place];  Service: Gastroenterology;;     Current Outpatient Medications  Medication Sig Dispense Refill  . acetaminophen (TYLENOL) 325 MG tablet Take 650 mg by mouth every 6 (six) hours as needed for mild pain.    [name] aspirin EC 81 MG tablet Take 81 mg by mouth at bedtime.    [name] BYSTOLIC 10 MG tablet Take 1 tablet by mouth once daily 45 tablet 0  . clopidogrel (PLAVIX) 75 MG tablet Take 1 tablet (75 mg total) by mouth daily. 90 tablet 3  . cyanocobalamin (,VITAMIN B-12,) 1000 MCG/ML injection INJECT 1ML INTO THE MUSCLE FOR 1 DOSE 6 mL 1  . dicyclomine (BENTYL) 10 MG capsule Take 1 capsule (10 mg total) by mouth 4 (four) times daily -  before meals and at bedtime. 90 capsule 2  . doxycycline (VIBRAMYCIN) 100 MG capsule Take 1 capsule (100  mg total) by mouth 2 (two) times daily. 20 capsule 0  . ezetimibe (ZETIA) 10 MG tablet Take 1 tablet (10 mg total) by mouth daily. 90 tablet 1  . Fiber Complete TABS Take 1 tablet by mouth daily.    . fluticasone (FLONASE) 50 MCG/ACT nasal spray Place 1-2 sprays into both nostrils at bedtime as needed for allergies or rhinitis.    [name] isosorbide mononitrate (IMDUR) 60 MG 24 hr tablet Take 1 tablet by mouth once daily 90 tablet 3  . Menthol, Topical Analgesic, (BENGAY EX) Apply 1 application topically daily as needed (pain).    . Multiple Vitamins-Minerals (MULTIVITAMIN WITH MINERALS) tablet Take 1 tablet by mouth daily.    . Naphazoline-Pheniramine (OPCON-A) 0.027-0.315 % SOLN Place 1 drop into both eyes daily as needed (itching eyes).    . NEEDLE, DISP, 25 G  (B-D DISP NEEDLE 25GX1") 25G X 1" MISC Inject 1000 mcg into muscle once a month. 50 each 0  . nitroGLYCERIN (NITROSTAT) 0.4 MG SL tablet Place 1 tablet (0.4 mg total) under the tongue every 5 (five) minutes as needed for chest pain. 30 tablet 3  . omeprazole (PRILOSEC) 40 MG capsule Take 1 capsule (40 mg total) by mouth 2 (two) times daily after a meal. 90 capsule 3  . oxyCODONE-acetaminophen (PERCOCET) 10-325 MG tablet Take 1 tablet by mouth 2 (two) times daily as needed for pain.    . Probiotic Product (PROBIOTIC ADVANCED PO) Take 1 tablet by mouth 2 (two) times daily.     [name] rOPINIRole (REQUIP) 2 MG tablet TAKE 1 TABLET BY MOUTH AT BEDTIME 90 tablet 0  . rosuvastatin (CRESTOR) 20 MG tablet Take 20 mg by mouth daily.    [name] saccharomyces boulardii (FLORASTOR) 250 MG capsule Take 1 capsule (250 mg total) by mouth 2 (two) times daily. 60 capsule 2  . venlafaxine (EFFEXOR) 75 MG tablet Take 1 tablet (75 mg total) by mouth daily. 90 tablet 1  . amLODipine (NORVASC) 2.5 MG tablet Take 2 tablets (5 mg total) by mouth daily. 60 tablet 2  . hydrochlorothiazide (MICROZIDE) 12.5 MG capsule Take 1 capsule (12.5 mg total) by mouth once a week. 15 capsule 2   No current facility-administered medications for this visit.    Allergies:   Losartan potassium and Tape    Social History:  The patient  reports that he quit smoking about 43 years ago. His smoking use included cigarettes. He has a 2.50 pack-year smoking history. He has never used smokeless tobacco. He reports that he does not drink alcohol or use drugs.   Family History:  The patient's family history includes Coronary artery disease in an other family member; Hypertension in his father; Melanoma in his mother; Stroke in his father.    ROS: All other systems are reviewed and negative. Unless otherwise mentioned in H&P    PHYSICAL EXAM: VS:  BP 130/64   Pulse 66   Ht 5\' 9"  (1.753 m)   Wt 180 lb 12.8 oz (82 kg)   SpO2 96%   BMI 26.70 kg/m  ,  BMI Body mass index is 26.7 kg/m. GEN: Well nourished, well developed, in no acute distress HEENT: normal Neck: no JVD, carotid bruits, or masses Cardiac: RRR; no murmurs, rubs, or gallops 1+ dependent pretibial  edema  Respiratory:  Clear to auscultation bilaterally, normal work of breathing, diminished on the left lower lobe.  No wheezes, no crackles, no rails. GI: soft, nontender, nondistended, + BS MS: no deformity or atrophy  Skin: warm and dry, no rash, several small "bites" bilateral legs, some on arms, and neck. Neuro:  Strength and sensation are intact Psych: euthymic mood, full affect   EKG: Not completed this office visit  Recent Labs: 07/27/2018: Pro B Natriuretic peptide (BNP) 150.0; TSH 3.05 01/04/2019: ALT 25; B Natriuretic Peptide 1,392.1 01/19/2019: BUN 19; Creatinine, Ser 1.56; Hemoglobin 11.5; Platelets 229; Potassium 4.1; Sodium 140    Lipid Panel    Component Value Date/Time   CHOL 80 03/31/2018 0508   CHOL 99 (L) 12/24/2017 0819   CHOL 116 03/30/2013 1053   TRIG 72 03/31/2018 0508   TRIG 80 03/30/2013 1053   HDL 32 (L) 03/31/2018 0508   HDL 28 (L) 12/24/2017 0819   HDL 34 (L) 03/30/2013 1053   CHOLHDL 2.5 03/31/2018 0508   VLDL 14 03/31/2018 0508   LDLCALC 34 03/31/2018 0508   LDLCALC 32 12/24/2017 0819   LDLCALC 66 03/30/2013 1053   LDLDIRECT 90.3 04/17/2008 0911      Wt Readings from Last 3 Encounters:  05/10/19 180 lb 12.8 oz (82 kg)  05/08/19 180 lb 11.2 oz (82 kg)  03/23/19 175 lb (79.4 kg)      Other studies Reviewed:  CATH/PCI: 01/05/2019 Conclusions: 1. Severe native coronary artery disease, including 75% ostial LMCA stenosis, 50% mid LAD disease, sequential 90% and 70% proximal/mid LCx stenoses as well as 70% mid OM1 lesion, and sequential 70% ostial, 80% proximal, and 95% distal RCA stenoses. 2. Widely patent LIMA-LAD. 3. Chronically occluded SVG-OM1. 4. Subtotally occluded SVG-rPDA-rPL with 99% mid graft stenosis followed by  extensive thrombus, some of which has embolized into the distal branches. 5. Upper normal left ventricular filling pressure. 6. Successful PCI to SVG-rPDA-rPL using overlapping Resolute Onyx 3.0 x 18 mm, 3.0 x 38 mm, and 3.0 x 38 mm drug-eluting stents with 0% residual stenosis and TIMI-3 flow. Small amount of nonocclusive thrombus remained in the rPL branch at the end of the procedure.  Recommendations: 1. Indefinite dual antiplatelet therapy with aspirin and clopidogrel, as tolerated. 2. Medical therapy of protected LMCA and LCx disease. If the patient has refractory symptoms, complex PCI of the ostial LMCA, LCx, and OM1 would need to be considered. 3. Aggressive secondary prevention. 4. Gentle post-catheterization hydration with close monitoring of renal function and hemoglobin, given contrast exposure and glycoprotein IIb/IIIa use during the intervention.  Intervention  Echo    01/05/2019 IMPRESSIONS  1. Left ventricular ejection fraction, by visual estimation, is 40 to 45%. The left ventricle has mild to moderately decreased function. There is mildly increased left ventricular hypertrophy. 2. Severe hypokinesis of the left ventricular, entire inferior wall and inferoseptal wall. 3. Elevated left atrial and left ventricular end-diastolic pressures. 4. Left ventricular diastolic parameters are consistent with Grade I diastolic dysfunction (impaired relaxation). 5. The left ventricle demonstrates regional wall motion abnormalities. 6. Global right ventricle has mildly reduced systolic function.The right ventricular size is normal. No increase in right ventricular wall thickness. 7. Left atrial size was moderately dilated. 8. Right atrial size was normal. 9. The mitral valve is abnormal. Mild mitral valve regurgitation. Posterior leaflet tethering is suspected. 10. The tricuspid valve is grossly normal. Tricuspid valve regurgitation is trivial. 11. The aortic valve is  tricuspid. Aortic valve regurgitation is trivial. Mild aortic valve sclerosis without stenosis. 12. The pulmonic valve was grossly normal. Pulmonic valve regurgitation is not visualized. 13. Possible atrial shunt by color doppler. 14. Cannot exclude ASD/PFO. Consider transesophageal echocardiogram, if clinically indicated. 15. The  inferior vena cava is normal in size with greater than 50% respiratory variability, suggesting right atrial pressure of 3 mmHg. 16. The left ventricular function has worsened.     ASSESSMENT AND PLAN:  1.  Lower extremity edema: I do not see evidence of massive or even moderate fluid overload.  He does have some dependent edema which have explained to him may be related to amlodipine and isosorbide.  For symptomatic relief I have given him HCTZ 12.5 mg which she is to take only once a week to assist with the edema.  I do not want him to take it daily as he does have a history of mild renal insufficiency.  I am checking a BMET and a BNP along with a CBC today.   I also checked Dopplers on his legs as he was complaining of chronic leg pain, with portable Doppler in the exam room.  He has good strong pulses, no bruits bilaterally.  He does have a bruise on his right last 3 toes but he does not know how this occurred.  He is on Plavix which may explain some of this.  2.  Dyspnea on exertion: He reports that the dyspnea has worsened over the last couple weeks after having the tick bites, and some prior to having had the infestation.  I have reviewed his last echocardiogram report completed on 04/10/2019.  This revealed normal EF of 50 to 55%, with grade 2 diastolic dysfunction.  With his history of COPD and OSA this may be also contributing to his current breathing status.  I have asked him to discuss with Dr. Claiborne Billings the need to reconsider CPAP therapy.  He may need to be referred to pulmonary for further testing at Dr. Evette Georges discretion.  3.  CAD: Most recent cardiac  catheterization 01/05/2019 revealed severe native vessel coronary artery disease, coronary artery bypass grafting, with occlusion of SVG to RPDA-RPL with 99% mid graft stenosis followed by thrombus.  He did have successful PCI to SVG-RPDA-RPL using overlapping resolute drug-eluting stents.  As this occurred approximately 4-1/2 months ago I do not believe his breathing status is necessary related to ischemia.  He denies any chest pain.  Will defer to Dr. Claiborne Billings on need to do further cardiac testing.  Continue isosorbide, and dual antiplatelet therapy with aspirin and Plavix.[name]  4.  Hyperlipidemia: Continue Crestor 20 mg daily, and Zetia..  Goal of LDL less than 70.  Most recent LDL on 03/31/2018 was 34.  Will need follow-up fasting labs if not completed by PCP on follow-up visit.  5.  Chronic leg pain: He does not appear to have any issues concerning blood flow, good distal pulses.  I do not hear any bruits on auscultation.  He continues on oxycodone 3 times daily.  May need to follow-up with PCP or neurologist concerning his discomfort.  6.  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  He has seen PCP who has placed him on doxycycline.  He states that they have gotten some better.  Current medicines are reviewed at length with the patient today.  I have spent 45 minutes dedicated to the care of this patient on the date of this encounter to include pre-visit review of records, assessment, management and diagnostic testing,with shared decision making.  Labs/ tests ordered today include: BMET, CBC, BNP  Phill Myron. [place] Pugh, ANP, AACC   05/10/2019 4:47 PM  [facility] [phone] Fax [phone]  Notice: This dictation was prepared with Dragon dictation along with smaller phrase technology. Any transcriptional errors that result from this process are unintentional and  may not be corrected upon review.

## 2019-05-10 NOTE — Patient Instructions (Signed)
Medication Instructions:  START- HCTZ 12.5 mg by mouth once a week  *If you need a refill on your cardiac medications before your next appointment, please call your pharmacy*   Lab Work: BMP, BNP, and CBC  If you have labs (blood work) drawn today and your tests are completely normal, you will receive your results only by: [name] MyChart Message (if you have MyChart) OR . A paper copy in the mail If you have any lab test that is abnormal or we need to change your treatment, we will call you to review the results.   Testing/Procedures: None Ordered   Follow-Up: At [facility], you and your health needs are our priority.  As part of our continuing mission to provide you with exceptional heart care, we have created designated Provider Care Teams.  These Care Teams include your primary Cardiologist (physician) and Advanced Practice Providers (APPs -  Physician Assistants and Nurse Practitioners) who all work together to provide you with the care you need, when you need it.  We recommend signing up for the patient portal called "MyChart".  Sign up information is provided on this After Visit Summary.  MyChart is used to connect with patients for Virtual Visits (Telemedicine).  Patients are able to view lab/test results, encounter notes, upcoming appointments, etc.  Non-urgent messages can be sent to your provider as well.   To learn more about what you can do with MyChart, go to [email or web address].    Your next appointment:   Keep appointment with Dr Claiborne Billings April 21  The format for your next appointment:   In Person  Provider:   [name], MD

## 2019-05-11 LAB — BASIC METABOLIC PANEL
BUN/Creatinine Ratio: 13 (ref 10–24)
BUN: 20 mg/dL (ref 8–27)
CO2: 27 mmol/L (ref 20–29)
Calcium: 9.1 mg/dL (ref 8.6–10.2)
Chloride: 104 mmol/L (ref 96–106)
Creatinine, Ser: 1.51 mg/dL — ABNORMAL HIGH (ref 0.76–1.27)
GFR calc Af Amer: 51 mL/min/{1.73_m2} — ABNORMAL LOW (ref >59)
GFR calc non Af Amer: 44 mL/min/{1.73_m2} — ABNORMAL LOW (ref >59)
Glucose: 134 mg/dL — ABNORMAL HIGH (ref 65–99)
Potassium: 4.3 mmol/L (ref 3.5–5.2)
Sodium: 143 mmol/L (ref 134–144)

## 2019-05-11 LAB — CBC
Hematocrit: 36.6 % — ABNORMAL LOW (ref 37.5–51.0)
Hemoglobin: 11.6 g/dL — ABNORMAL LOW (ref 13.0–17.7)
MCH: 26.5 pg — ABNORMAL LOW (ref 26.6–33.0)
MCHC: 31.7 g/dL (ref 31.5–35.7)
MCV: 84 fL (ref 79–97)
Platelets: 168 10*3/uL (ref 150–450)
RBC: 4.37 x10E6/uL (ref 4.14–5.80)
RDW: 13.8 % (ref 11.6–15.4)
WBC: 6.2 10*3/uL (ref 3.4–10.8)

## 2019-05-11 LAB — BRAIN NATRIURETIC PEPTIDE: BNP: 190.9 pg/mL — ABNORMAL HIGH (ref 0.0–100.0)

## 2019-05-16 ENCOUNTER — Telehealth: Payer: Self-pay | Admitting: *Deleted

## 2019-05-16 DIAGNOSIS — Z79899 Other long term (current) drug therapy: Secondary | ICD-10-CM

## 2019-05-16 NOTE — Telephone Encounter (Signed)
Pt aware of his blood work, BMP ordered and mailed to pt 

## 2019-05-16 NOTE — Telephone Encounter (Signed)
-----   Message from Lendon Colonel, NP sent at 05/13/2019 11:47 AM EDT ----- BNP does not reveal significant fluid overload.  Will review labs after he tries the HCTZ.  Should have BMET ordered already.

## 2019-05-17 DIAGNOSIS — R3912 Poor urinary stream: Secondary | ICD-10-CM | POA: Diagnosis not present

## 2019-05-17 DIAGNOSIS — Z8551 Personal history of malignant neoplasm of bladder: Secondary | ICD-10-CM | POA: Diagnosis not present

## 2019-05-18 DIAGNOSIS — I2510 Atherosclerotic heart disease of native coronary artery without angina pectoris: Secondary | ICD-10-CM | POA: Diagnosis not present

## 2019-05-18 DIAGNOSIS — M545 Low back pain: Secondary | ICD-10-CM | POA: Diagnosis not present

## 2019-05-18 DIAGNOSIS — I252 Old myocardial infarction: Secondary | ICD-10-CM | POA: Diagnosis not present

## 2019-05-18 DIAGNOSIS — G8929 Other chronic pain: Secondary | ICD-10-CM | POA: Diagnosis not present

## 2019-05-18 DIAGNOSIS — M170 Bilateral primary osteoarthritis of knee: Secondary | ICD-10-CM | POA: Diagnosis not present

## 2019-05-19 ENCOUNTER — Other Ambulatory Visit: Payer: Self-pay | Admitting: Cardiovascular Disease

## 2019-05-19 NOTE — Telephone Encounter (Signed)
New Message   *STAT* If patient is at the pharmacy, call can be transferred to refill team.   1. Which medications need to be refilled? (please list name of each medication and dose if known) amLODipine (NORVASC) 2.5 MG tablet(Expired)  2. Which pharmacy/location (including street and city if local pharmacy) is medication to be sent to? [place], [place] - [id number]  3. Do they need a 30 day or 90 day supply? 90 Day   Patient is out of medication and needs today

## 2019-05-22 MED ORDER — AMLODIPINE BESYLATE 2.5 MG PO TABS: 5.0000 mg | ORAL_TABLET | Freq: Every day | ORAL | 2 refills | Status: DC

## 2019-05-22 NOTE — Telephone Encounter (Signed)
Rx has been sent to the pharmacy electronically. ° °

## 2019-05-22 NOTE — Telephone Encounter (Signed)
*  STAT* If patient is at the pharmacy, call can be transferred to refill team.   1. Which medications need to be refilled? (please list name of each medication and dose if known)  Amlodipine- need this today  2. Which pharmacy/location (including street and city if local pharmacy) is medication to be sent to?*[facility] [place], [place]  3. Do they need a 30 day or 90 day supply? 60 days and refills

## 2019-05-24 DIAGNOSIS — M170 Bilateral primary osteoarthritis of knee: Secondary | ICD-10-CM | POA: Diagnosis not present

## 2019-05-24 DIAGNOSIS — G8929 Other chronic pain: Secondary | ICD-10-CM | POA: Diagnosis not present

## 2019-05-24 DIAGNOSIS — I252 Old myocardial infarction: Secondary | ICD-10-CM | POA: Diagnosis not present

## 2019-05-24 DIAGNOSIS — M545 Low back pain: Secondary | ICD-10-CM | POA: Diagnosis not present

## 2019-05-24 DIAGNOSIS — I2510 Atherosclerotic heart disease of native coronary artery without angina pectoris: Secondary | ICD-10-CM | POA: Diagnosis not present

## 2019-05-27 ENCOUNTER — Other Ambulatory Visit: Payer: Self-pay | Admitting: Cardiovascular Disease

## 2019-05-29 ENCOUNTER — Telehealth: Payer: Self-pay | Admitting: Internal Medicine

## 2019-05-29 DIAGNOSIS — M545 Low back pain, unspecified: Secondary | ICD-10-CM

## 2019-05-29 DIAGNOSIS — G8929 Other chronic pain: Secondary | ICD-10-CM

## 2019-05-29 DIAGNOSIS — I10 Essential (primary) hypertension: Secondary | ICD-10-CM

## 2019-05-29 DIAGNOSIS — G629 Polyneuropathy, unspecified: Secondary | ICD-10-CM

## 2019-05-29 NOTE — Chronic Care Management (AMB) (Signed)
  Chronic Care Management   Note  05/29/2019 Name: [name] MRN: [id number] DOB: 1940-10-03  [name] is a 79 y.o. year old male who is a primary care patient of Isaac Bliss, Rayford Halsted, MD. I reached out to [name] by phone today in response to a referral sent by [name]'s PCP, Isaac Bliss, Rayford Halsted, MD.   [name] was given information about Chronic Care Management services today including:  1. CCM service includes personalized support from designated clinical staff supervised by his physician, including individualized plan of care and coordination with other care providers 2. 24/7 contact phone numbers for assistance for urgent and routine care needs. 3. Service will only be billed when office clinical staff spend 20 minutes or more in a month to coordinate care. 4. Only one practitioner may furnish and bill the service in a calendar month. 5. The patient may stop CCM services at any time (effective at the end of the month) by phone call to the office staff.   Patient agreed to services and verbal consent obtained.   Follow up plan:   [place]

## 2019-05-31 DIAGNOSIS — I2510 Atherosclerotic heart disease of native coronary artery without angina pectoris: Secondary | ICD-10-CM | POA: Diagnosis not present

## 2019-05-31 DIAGNOSIS — I252 Old myocardial infarction: Secondary | ICD-10-CM | POA: Diagnosis not present

## 2019-05-31 DIAGNOSIS — M170 Bilateral primary osteoarthritis of knee: Secondary | ICD-10-CM | POA: Diagnosis not present

## 2019-05-31 DIAGNOSIS — G8929 Other chronic pain: Secondary | ICD-10-CM | POA: Diagnosis not present

## 2019-05-31 DIAGNOSIS — M545 Low back pain: Secondary | ICD-10-CM | POA: Diagnosis not present

## 2019-06-26 ENCOUNTER — Telehealth: Payer: Self-pay | Admitting: Internal Medicine

## 2019-06-26 NOTE — Telephone Encounter (Signed)
Patient needs 3 month refills on his Oxycodone- about 3 pills left  [name]

## 2019-06-27 NOTE — Telephone Encounter (Signed)
Spoke with the pt and informed him of the message below.  Appt scheduled for 6/9 at 11:30am.

## 2019-06-27 NOTE — Telephone Encounter (Signed)
Needs OV for controlled substance refill. Could be virtual.

## 2019-06-28 ENCOUNTER — Other Ambulatory Visit: Payer: Self-pay

## 2019-06-28 ENCOUNTER — Telehealth (INDEPENDENT_AMBULATORY_CARE_PROVIDER_SITE_OTHER): Payer: Medicare Other | Admitting: Internal Medicine

## 2019-06-28 DIAGNOSIS — G8929 Other chronic pain: Secondary | ICD-10-CM

## 2019-06-28 DIAGNOSIS — M545 Low back pain, unspecified: Secondary | ICD-10-CM

## 2019-06-28 MED ORDER — OXYCODONE-ACETAMINOPHEN 10-325 MG PO TABS: 1.0000 | ORAL_TABLET | Freq: Two times a day (BID) | ORAL | 0 refills | Status: DC | PRN

## 2019-06-28 MED ORDER — OXYCODONE-ACETAMINOPHEN 10-325 MG PO TABS: 1.0000 | ORAL_TABLET | Freq: Two times a day (BID) | ORAL | 0 refills | Status: AC | PRN

## 2019-06-28 NOTE — Progress Notes (Signed)
Virtual Visit via Telephone Note  I connected with [name] on 06/28/19 at 11:30 AM EDT by telephone and verified that I am speaking with the correct person using two identifiers.   I discussed the limitations, risks, security and privacy concerns of performing an evaluation and management service by telephone and the availability of in person appointments. I also discussed with the patient that there may be a patient responsible charge related to this service. The patient expressed understanding and agreed to proceed.  Location patient: home Location provider: work office Participants present for the call: patient, provider Patient did not have a visit in the prior 7 days to address this/these issue(s).   History of Present Illness:  This visit has been scheduled for narcotic refills per contract.  He is prescribed Percocet 10/325.  He states he has pain all over but mainly over his lower back and legs.  This is chronic.  He cuts the tablet in half and then takes 1 tablet in the morning 1 tablet at noon and sometimes 1 tablet in the evening.   Observations/Objective: Patient sounds cheerful and well on the phone. I do not appreciate any increased work of breathing. Speech and thought processing are grossly intact. Patient reported vitals: None reported   Current Outpatient Medications:  .  acetaminophen (TYLENOL) 325 MG tablet, Take 650 mg by mouth every 6 (six) hours as needed for mild pain., Disp: , Rfl:  .  amLODipine (NORVASC) 2.5 MG tablet, Take 2 tablets (5 mg total) by mouth daily., Disp: 90 tablet, Rfl: 2 .  aspirin EC 81 MG tablet, Take 81 mg by mouth at bedtime., Disp: , Rfl:  .  BYSTOLIC 10 MG tablet, Take 1 tablet by mouth once daily, Disp: 45 tablet, Rfl: 0 .  clopidogrel (PLAVIX) 75 MG tablet, Take 1 tablet (75 mg total) by mouth daily., Disp: 90 tablet, Rfl: 3 .  cyanocobalamin (,VITAMIN B-12,) 1000 MCG/ML injection, INJECT 1ML INTO THE MUSCLE FOR 1 DOSE,  Disp: 6 mL, Rfl: 1 .  dicyclomine (BENTYL) 10 MG capsule, Take 1 capsule (10 mg total) by mouth 4 (four) times daily -  before meals and at bedtime., Disp: 90 capsule, Rfl: 2 .  doxycycline (VIBRAMYCIN) 100 MG capsule, Take 1 capsule (100 mg total) by mouth 2 (two) times daily., Disp: 20 capsule, Rfl: 0 .  ezetimibe (ZETIA) 10 MG tablet, Take 1 tablet (10 mg total) by mouth daily., Disp: 90 tablet, Rfl: 1 .  Fiber Complete TABS, Take 1 tablet by mouth daily., Disp: , Rfl:  .  fluticasone (FLONASE) 50 MCG/ACT nasal spray, Place 1-2 sprays into both nostrils at bedtime as needed for allergies or rhinitis., Disp: , Rfl:  .  hydrochlorothiazide (MICROZIDE) 12.5 MG capsule, Take 1 capsule (12.5 mg total) by mouth once a week., Disp: 15 capsule, Rfl: 2 .  isosorbide mononitrate (IMDUR) 60 MG 24 hr tablet, Take 1 tablet by mouth once daily, Disp: 90 tablet, Rfl: 3 .  Menthol, Topical Analgesic, (BENGAY EX), Apply 1 application topically daily as needed (pain)., Disp: , Rfl:  .  Multiple Vitamins-Minerals (MULTIVITAMIN WITH MINERALS) tablet, Take 1 tablet by mouth daily., Disp: , Rfl:  .  Naphazoline-Pheniramine (OPCON-A) 0.027-0.315 % SOLN, Place 1 drop into both eyes daily as needed (itching eyes)., Disp: , Rfl:  .  NEEDLE, DISP, 25 G (B-D DISP NEEDLE 25GX1") 25G X 1" MISC, Inject 1000 mcg into muscle once a month., Disp: 50 each, Rfl: 0 .  nitroGLYCERIN (NITROSTAT) 0.4 MG SL tablet, Place 1 tablet (0.4 mg total) under the tongue every 5 (five) minutes as needed for chest pain., Disp: 30 tablet, Rfl: 3 .  omeprazole (PRILOSEC) 40 MG capsule, Take 1 capsule (40 mg total) by mouth 2 (two) times daily after a meal., Disp: 90 capsule, Rfl: 3 .  oxyCODONE-acetaminophen (PERCOCET) 10-325 MG tablet, Take 1 tablet by mouth 2 (two) times daily as needed for pain., Disp: , Rfl:  .  Probiotic Product (PROBIOTIC ADVANCED PO), Take 1 tablet by mouth 2 (two) times daily. , Disp: , Rfl:  .  rOPINIRole (REQUIP) 2 MG  tablet, TAKE 1 TABLET BY MOUTH AT BEDTIME, Disp: 90 tablet, Rfl: 0 .  rosuvastatin (CRESTOR) 20 MG tablet, Take 1 tablet by mouth once daily, Disp: 90 tablet, Rfl: 0 .  saccharomyces boulardii (FLORASTOR) 250 MG capsule, Take 1 capsule (250 mg total) by mouth 2 (two) times daily., Disp: 60 capsule, Rfl: 2 .  venlafaxine (EFFEXOR) 75 MG tablet, Take 1 tablet (75 mg total) by mouth daily., Disp: 90 tablet, Rfl: 1  Review of Systems:  Constitutional: Denies fever, chills, diaphoresis, appetite change and fatigue.  HEENT: Denies photophobia, eye pain, redness, hearing loss, ear pain, congestion, sore throat, rhinorrhea, sneezing, mouth sores, trouble swallowing, neck pain, neck stiffness and tinnitus.   Respiratory: Denies SOB, DOE, cough, chest tightness,  and wheezing.   Cardiovascular: Denies chest pain, palpitations and leg swelling.  Gastrointestinal: Denies nausea, vomiting, abdominal pain, diarrhea, constipation, blood in stool and abdominal distention.  Genitourinary: Denies dysuria, urgency, frequency, hematuria, flank pain and difficulty urinating.  Endocrine: Denies: hot or cold intolerance, sweats, changes in hair or nails, polyuria, polydipsia. Musculoskeletal: Denies myalgias, back pain, joint swelling, arthralgias and gait problem.  Skin: Denies pallor, rash and wound.  Neurological: Denies dizziness, seizures, syncope, weakness, light-headedness, numbness and headaches.  Hematological: Denies adenopathy. Easy bruising, personal or family bleeding history  Psychiatric/Behavioral: Denies suicidal ideation, mood changes, confusion, nervousness, sleep disturbance and agitation   Assessment and Plan:  Chronic bilateral low back pain without sciatica -PDMP reviewed, no red flags, overdose risk score 60.  Refill Percocet 10/325 mg 60 tablets/month x 3 months. -Need to make sure updated pain contract is signed at next in person visit in 3 months, he will return then for his  physical.   I discussed the assessment and treatment plan with the patient. The patient was provided an opportunity to ask questions and all were answered. The patient agreed with the plan and demonstrated an understanding of the instructions.   The patient was advised to call back or seek an in-person evaluation if the symptoms worsen or if the condition fails to improve as anticipated.  I provided 13 minutes of non-face-to-face time during this encounter.   [name], MD [facility]

## 2019-07-04 ENCOUNTER — Ambulatory Visit: Payer: Medicare Other

## 2019-07-04 ENCOUNTER — Other Ambulatory Visit: Payer: Self-pay

## 2019-07-04 DIAGNOSIS — M545 Low back pain, unspecified: Secondary | ICD-10-CM

## 2019-07-04 DIAGNOSIS — I2510 Atherosclerotic heart disease of native coronary artery without angina pectoris: Secondary | ICD-10-CM

## 2019-07-04 DIAGNOSIS — G8929 Other chronic pain: Secondary | ICD-10-CM

## 2019-07-04 DIAGNOSIS — I214 Non-ST elevation (NSTEMI) myocardial infarction: Secondary | ICD-10-CM

## 2019-07-04 DIAGNOSIS — E785 Hyperlipidemia, unspecified: Secondary | ICD-10-CM | POA: Insufficient documentation

## 2019-07-04 DIAGNOSIS — I10 Essential (primary) hypertension: Secondary | ICD-10-CM

## 2019-07-04 DIAGNOSIS — E538 Deficiency of other specified B group vitamins: Secondary | ICD-10-CM

## 2019-07-04 NOTE — Addendum Note (Signed)
Addended by: Westley Hummer B on: 07/04/2019 09:17 AM   Modules accepted: Orders

## 2019-07-04 NOTE — Chronic Care Management (AMB) (Signed)
Chronic Care Management Pharmacy  Name: [name]  MRN: [id number] DOB: Dec 01, 1940  Initial Questions: 1. Have you seen any other providers since your last visit? NA 2. Any changes in your medicines or health? No   Chief Complaint/ HPI  [name],  79 y.o. , male presents for their Initial CCM visit with the clinical pharmacist via telephone due to COVID-19 Pandemic.  Patient expressed overall he is doing well. His daughter helps manage his medications. She picks up refills and organizes pill box for him. However, patient feels confident in what he is taking.  Patient requested information/ resources of Medicare options and helping him decide which is best for him.   PCP : [name], Jacob Halsted, MD  Their chronic conditions include: HTN, HLD, CAD, Hx of NSTEMI, Hx of CABG, Ischemic cardiomyopathy, Allergic rhinitis, Barrett's esophagus, GERD, Pain, RLS, vitamin B12 deficiency, constipation, CRI stage 3  Office Visits: 06/28/2019- Jacob Frohlich, MD- Patient presented for virtual visit for chronic bilateral low back pain. Percocet 10/325mg  was refilled. Pain contract to be updated and signed at next in person visit in 3 months.   05/08/2019- Jacob Littler, MD- Patient presented for office visit for tick bites, burning mouth symptoms, and irritated skin lesions on neck. Patient was prescribed doxycycline 100mg  BID for 10 days. Patient to make sure toothpast does not contain sodium lauryl sulfate and B12 to be considered at next follow up with primary. For neck lesions, patient requested treatment with liquid nitrogen. 1 lesion treated on left side of neck and 1 on right side of neck.   Consult Visit: 05/10/2019- Cardiology- Jacob Sims, NP- Patient presented for office visit for follow up of CAD. (CABG in 2001). Patient chief complaints edema, DOE. For edema, patient was prescribed HCTZ 12.5mg  once a week. Checked Dopplers on legs. Good strong pulses. No  bruits bilaterally. For DOE, patient was instructed to discuss with Dr. Claiborne Billings to reconsider CPAP therapy. BMET, CBC, BNP ordered.   03/23/2019- Cardiology- Jacob Majestic, MD- Patient presented for telephone visit for 6 month follow up. Patient to continue DAPT ideally for 12 months uninterrupted. If repeat biliary procedure be required, can be arranged after 6 months. No major interventions made. Patient to follow up in 3 months.   02/07/2019- Pulmonology- Jacob Logan, MD- Patient presented for office visit for head and chest congestion. Patient reports cough improvement on Zyrtec and Flonase. Patient to obtain X-ray.   Medications: Outpatient Encounter Medications as of 07/04/2019  Medication Sig Note  . acetaminophen (TYLENOL) 325 MG tablet Take 650 mg by mouth every 6 (six) hours as needed for mild pain.   [name] amLODipine (NORVASC) 2.5 MG tablet Take 2 tablets (5 mg total) by mouth daily.   [name] aspirin EC 81 MG tablet Take 81 mg by mouth at bedtime.   [name] BYSTOLIC 10 MG tablet Take 1 tablet by mouth once daily   . clopidogrel (PLAVIX) 75 MG tablet Take 1 tablet (75 mg total) by mouth daily.   . cyanocobalamin (,VITAMIN B-12,) 1000 MCG/ML injection INJECT 1ML INTO THE MUSCLE FOR 1 DOSE   . ezetimibe (ZETIA) 10 MG tablet Take 1 tablet (10 mg total) by mouth daily.   . Fiber Complete TABS Take 1 each by mouth daily.  07/04/2019: Taking gummies.  . fluticasone (FLONASE) 50 MCG/ACT nasal spray Place 1-2 sprays into both nostrils at bedtime as needed for allergies or rhinitis.   . hydrochlorothiazide (MICROZIDE) 12.5 MG capsule Take 1 capsule (12.5 mg total)  by mouth once a week.   . isosorbide mononitrate (IMDUR) 60 MG 24 hr tablet Take 1 tablet by mouth once daily   . loratadine (CLARITIN) 10 MG tablet Take 10 mg by mouth daily.   . Menthol, Topical Analgesic, (BENGAY EX) Apply 1 application topically daily as needed (pain).   . Multiple Vitamins-Minerals (MULTIVITAMIN WITH MINERALS) tablet Take 1 tablet  by mouth daily.   . Naphazoline-Pheniramine (OPCON-A) 0.027-0.315 % SOLN Place 1 drop into both eyes daily as needed (itching eyes).   . NEEDLE, DISP, 25 G (B-D DISP NEEDLE 25GX1") 25G X 1" MISC Inject 1000 mcg into muscle once a month.   . nitroGLYCERIN (NITROSTAT) 0.4 MG SL tablet Place 1 tablet (0.4 mg total) under the tongue every 5 (five) minutes as needed for chest pain.   [name] omeprazole (PRILOSEC) 40 MG capsule Take 1 capsule (40 mg total) by mouth 2 (two) times daily after a meal. (Patient taking differently: Take 20 mg by mouth in the morning. Over-the- counter)   . oxyCODONE-acetaminophen (PERCOCET) 10-325 MG tablet Take 1 tablet by mouth 2 (two) times daily as needed for pain.   [name] rOPINIRole (REQUIP) 2 MG tablet TAKE 1 TABLET BY MOUTH AT BEDTIME   . rosuvastatin (CRESTOR) 20 MG tablet Take 1 tablet by mouth once daily   . saccharomyces boulardii (FLORASTOR) 250 MG capsule Take 1 capsule (250 mg total) by mouth 2 (two) times daily.   [name] venlafaxine (EFFEXOR) 75 MG tablet Take 1 tablet (75 mg total) by mouth daily.   [name] dicyclomine (BENTYL) 10 MG capsule Take 1 capsule (10 mg total) by mouth 4 (four) times daily -  before meals and at bedtime. (Patient not taking: Reported on 07/04/2019)   . doxycycline (VIBRAMYCIN) 100 MG capsule Take 1 capsule (100 mg total) by mouth 2 (two) times daily. (Patient not taking: Reported on 07/04/2019)   . Probiotic Product (PROBIOTIC ADVANCED PO) Take 1 tablet by mouth 2 (two) times daily.  (Patient not taking: Reported on 07/04/2019)    No facility-administered encounter medications on file as of 07/04/2019.     Current Diagnosis/Assessment:  Goals Addressed            This Visit's Progress   . Pharmacy Care Plan       CARE PLAN ENTRY  Current Barriers:  . Chronic Disease Management support, education, and care coordination needs related to Hypertension, Hyperlipidemia, Coronary Artery Disease, and History of NSTEMI, bilateral back pain, vitamin B12  deficiency    Hypertension . Pharmacist Clinical Goal(s): o Over the next 90 days, patient will work with PharmD and providers to maintain BP goal <130/80 . Current regimen:   Amlodipine 2.5mg , 2 tablets once daily  nebivolol (Bystolic) 10mg , 1 tablet once daily . Patient self care activities - Over the next 90 days, patient will: o Check BP as instructed, document, and provide at future appointments o Ensure daily salt intake < 2300 mg/day  Hyperlipidemia . Pharmacist Clinical Goal(s): o Over the next 90 days, patient will work with PharmD and providers to maintain LDL goal < 70 . Current regimen:  . Rosuvastatin 20mg , 1 tablet once daily . Ezetimibe (Zetia) 10mg , 1 tablet once daily . Patient self care activities - Over the next 90 days, patient will: o Continue current medications as instructed.   Coronary artery disease/ History of NSTEMI . Pharmacist Clinical Goal(s) o Over the next 90 days, patient will work with PharmD and providers to reduce risk of recurrent NSTEMI  .  Current regimen:   Isosorbide mononitrate 60mg , 1 tablet once daily  Nitroglycerin 0.4mg  SL, 1 tablet under tongue every five minutes as needed for chest pain   Aspirin 81mg , 1 tablet once daily  Clopidogrel 75mg , 1 tablet once daily . Interventions: o Recommend checking expiration date of nitroglycerin and refilling as needed.  . Patient self care activities - Over the next 90 days, patient will: o Continue current medications as instructed and keep follow up visit with Dr. Claiborne Billings.   Pain . Pharmacist Clinical Goal(s) o Over the next 90 days, patient will work with PharmD and providers to minimize pain level.  . Current regimen:  . Acetaminophen 325mg , 2 tablets every six hours as needed for mild pain . Menthol analgesic (Bengay) apply topically daily as needed for pain . Oxycodone- APAP 10/325g, 1 tablet twice daily as needed for pain . Interventions: o  The maximum daily dose of acetaminophen  was discussed with the patient. He was encouraged not to exceed 4,000 mg of acetaminophen during a 24 hour period and was asked to keep in mind that acetaminophen can also be found in many over-the-counter cold medications as well as narcotics that may be given for pain. . Patient self care activities o Patient will continue current medications as prescribed.   Vitamin B12 deficiency . Pharmacist Clinical Goal(s) o Over the next 90 days, patient will work with PharmD and providers to maintain vitamin B12 levels: 211 - 911 pg/mL . Current regimen:  o Cyanocobalamin 1048mcg/ml, inject 1 ML into muscle for 1 dose (once a month . Interventions: o Recommend repeat vitamin B12 level at next physical.  . Patient self care activities o Patient will continue current medications as prescribed.   Medication management . Pharmacist Clinical Goal(s): o Over the next 90 days, patient will work with PharmD and providers to maintain optimal medication adherence . Current pharmacy: [facility] . Interventions o Comprehensive medication review performed. o Continue current medication management strategy . Patient self care activities - Over the next 90 days, patient will: o Take medications as prescribed o Report any questions or concerns to PharmD and/or provider(s)  Initial goal documentation        Ischemic cardiomyopathy   Patient reports swelling is doing much better since being on HCTZ once weekly.   Type: Diastolic dysfunction   Last ejection fraction: 50 to 55% (04/10/2019)   Patient is currently controlled on the following medications:  Fluid management  HCTZ 12.5mg , 1 capsule once a week   Plan  Continue current medications   Hypertension  - denies dizziness/ lightheadedness - orthostatic hypotension  Office blood pressures are  BP Readings from Last 3 Encounters:  05/10/19 130/64  05/08/19 130/68  02/07/19 (!) 112/54    Patient has failed these meds in the past:  losartan   Patient checks BP at home weekly by home health    Patient home BP readings are ranging: unable to provide exact readings   Patient is controlled on:   Amlodipine 2.5mg , 2 tablets once daily  nebivolol (Bystolic) 10mg , 1 tablet once daily  Plan Managed by cardiology Claiborne Billings) Continue current medications   Hyperlipidemia   Lipid Panel     Component Value Date/Time   CHOL 80 03/31/2018 0508   CHOL 99 (L) 12/24/2017 0819   CHOL 116 03/30/2013 1053   TRIG 72 03/31/2018 0508   TRIG 80 03/30/2013 1053   HDL 32 (L) 03/31/2018 0508   HDL 28 (L) 12/24/2017 0819   HDL 34 (  L) 03/30/2013 1053   LDLCALC 34 03/31/2018 0508   LDLCALC 32 12/24/2017 0819   LDLCALC 66 03/30/2013 1053   LDLDIRECT 90.3 04/17/2008 0911     The ASCVD Risk score (Goff DC Jr., et al., 2013) failed to calculate for the following reasons:   The patient has a prior MI or stroke diagnosis   Patient has failed these meds in past: atorvastatin, simvastatin  Patient is currently controlled on the following medications:  . Rosuvastatin 20mg , 1 tablet once daily . Ezetimibe (Zetia) 10mg , 1 tablet once daily  Plan Managed by cardiology Claiborne Billings) Continue current medications  CAD (Hx of CABG, NSTEMI)    Patient reported rarely using nitroglycerin.   Patient is currently controlled on the following medications:   Isosorbide mononitrate 60mg , 1 tablet once daily  Nitroglycerin 0.4mg  SL, 1 tablet under tongue every five minutes as needed for chest pain   Aspirin 81mg , 1 tablet once daily  Clopidogrel 75mg , 1 tablet once daily  We discussed:  checking expiration date of nitroglycerin and replacing as needed.   Plan Managed by cardiology Claiborne Billings) Continue current medications   Pain   Patient is currently controlled on the following medications:  . Acetaminophen 325mg , 2 tablets every six hours as needed for mild pain (patient reports taking 1 tablet at night occasionally)  . Menthol analgesic  (Bengay) apply topically daily as needed for pain . Oxycodone- APAP 10/325g, 1 tablet twice daily as needed for pain (patient reports taking 0.5 tablet in the morning lunch, and sometimes at dinner time)   We discussed: The maximum daily dose of acetaminophen was discussed with the patient. He was encouraged not to exceed 4,000 mg of acetaminophen during a 24 hour period and was asked to keep in mind that acetaminophen can also be found in many over-the-counter cold medications as well as narcotics that may be given for pain. The patient expresses understanding of these issues and questions were answered.  Plan Continue current medications  Vitamin B12 deficiency   Vitamin B12: 696 (05/18/2018)  Patient is currently controlled on the following medications:  . Cyanocobalamin 1031mcg/ml, inject 1 ML into muscle for 1 dose (once a month)   Plan Continue current medications  Barrett's esophagus, GERD    Patient is currently controlled on the following medications:  . Omeprazole 40mg , 1 capsule twice daily after a meal (takes 20mg  once in the morning)   Plan Continue current medications  Allergic rhinitis   Patient is currently controlled on the following medications:  . Fluticasone (Flonase) nasal spray, 1 to 2 sprays into both nostrils at bedtime as needed for allergies or rhinitis . Loratadine 10mg , 1 tablet once daily   Plan Managed by pulmonology Roni Bread)  Continue current medications   RLS   Patient is currently controlled on the following medications:  . Ropinirole 2mg , 1 tablet at bedtime   Plan Continue current medications  Constipation   Patient is currently controlled on the following medications:  . Fiber complete gummies  . Florastor 250mg , 1 capsule twice daily   Plan Continue current medications   Depression  Reports taking for 30 years.  Had some depression in the past, but states doing well currently.   Patient is currently controlled on the following  medications:  . Venlafaxine 75mg , 1 tablet once daily   Plan Continue current medications  CRI, Stage 3   Kidney Function Lab Results  Component Value Date/Time   CREATININE 1.51 (H) 05/10/2019 04:34 PM   CREATININE 1.56 (H)  01/19/2019 09:20 AM   CREATININE 1.17 12/26/2015 10:34 AM   CREATININE 1.31 06/28/2014 11:03 AM   GFR 41.03 (L) 07/27/2018 04:18 PM   GFRNONAA 44 (L) 05/10/2019 04:34 PM   GFRAA 51 (L) 05/10/2019 04:34 PM  Kidney function stable.  Plan Continue to monitor and adjust medications as needed.     Medication Management  Patient organizes medications: daughter organizes medications in pill box  Primary Pharmacy: [facility]  Adherence:  - omeprazole 40mg  (last filled 05/15/19 for 45 day supply).- patient reports taking OTC now.   Follow up Follow up visit with PharmD in 5 months.  Provided patient, information on Medicare plan consultants for [place] patients.    Anson Crofts, PharmD Clinical Pharmacist [facility] [place] [phone]

## 2019-07-04 NOTE — Patient Instructions (Addendum)
Visit Information  Goals Addressed            This Visit's Progress   . Pharmacy Care Plan       CARE PLAN ENTRY  Current Barriers:  . Chronic Disease Management support, education, and care coordination needs related to Hypertension, Hyperlipidemia, Coronary Artery Disease, and History of NSTEMI, bilateral back pain, vitamin B12 deficiency    Hypertension . Pharmacist Clinical Goal(s): o Over the next 90 days, patient will work with PharmD and providers to maintain BP goal <130/80 . Current regimen:   Amlodipine 2.5mg , 2 tablets once daily  nebivolol (Bystolic) 10mg , 1 tablet once daily . Patient self care activities - Over the next 90 days, patient will: o Check BP as instructed, document, and provide at future appointments o Ensure daily salt intake < 2300 mg/day  Hyperlipidemia . Pharmacist Clinical Goal(s): o Over the next 90 days, patient will work with PharmD and providers to maintain LDL goal < 70 . Current regimen:  . Rosuvastatin 20mg , 1 tablet once daily . Ezetimibe (Zetia) 10mg , 1 tablet once daily . Patient self care activities - Over the next 90 days, patient will: o Continue current medications as instructed.   Coronary artery disease/ History of NSTEMI . Pharmacist Clinical Goal(s) o Over the next 90 days, patient will work with PharmD and providers to reduce risk of recurrent NSTEMI  . Current regimen:   Isosorbide mononitrate 60mg , 1 tablet once daily  Nitroglycerin 0.4mg  SL, 1 tablet under tongue every five minutes as needed for chest pain   Aspirin 81mg , 1 tablet once daily  Clopidogrel 75mg , 1 tablet once daily . Interventions: o Recommend checking expiration date of nitroglycerin and refilling as needed.  . Patient self care activities - Over the next 90 days, patient will: o Continue current medications as instructed and keep follow up visit with Dr. Claiborne Billings.   Pain . Pharmacist Clinical Goal(s) o Over the next 90 days, patient will work with  PharmD and providers to minimize pain level.  . Current regimen:  . Acetaminophen 325mg , 2 tablets every six hours as needed for mild pain . Menthol analgesic (Bengay) apply topically daily as needed for pain . Oxycodone- APAP 10/325g, 1 tablet twice daily as needed for pain . Interventions: o  The maximum daily dose of acetaminophen was discussed with the patient. He was encouraged not to exceed 4,000 mg of acetaminophen during a 24 hour period and was asked to keep in mind that acetaminophen can also be found in many over-the-counter cold medications as well as narcotics that may be given for pain. . Patient self care activities o Patient will continue current medications as prescribed.   Vitamin B12 deficiency . Pharmacist Clinical Goal(s) o Over the next 90 days, patient will work with PharmD and providers to maintain vitamin B12 levels: 211 - 911 pg/mL . Current regimen:  o Cyanocobalamin 1017mcg/ml, inject 1 ML into muscle for 1 dose (once a month . Interventions: o Recommend repeat vitamin B12 level at next physical.  . Patient self care activities o Patient will continue current medications as prescribed.   Medication management . Pharmacist Clinical Goal(s): o Over the next 90 days, patient will work with PharmD and providers to maintain optimal medication adherence . Current pharmacy: [facility] . Interventions o Comprehensive medication review performed. o Continue current medication management strategy . Patient self care activities - Over the next 90 days, patient will: o Take medications as prescribed o Report any questions or concerns to  PharmD and/or provider(s)  Initial goal documentation        Mr. Molner was given information about Chronic Care Management services today including:  1. CCM service includes personalized support from designated clinical staff supervised by his physician, including individualized plan of care and coordination with other care  providers 2. 24/7 contact phone numbers for assistance for urgent and routine care needs. 3. Standard insurance, coinsurance, copays and deductibles apply for chronic care management only during months in which we provide at least 20 minutes of these services. Most insurances cover these services at 100%, however patients may be responsible for any copay, coinsurance and/or deductible if applicable. This service may help you avoid the need for more expensive face-to-face services. 4. Only one practitioner may furnish and bill the service in a calendar month. 5. The patient may stop CCM services at any time (effective at the end of the month) by phone call to the office staff.  Patient agreed to services and verbal consent obtained.   The patient verbalized understanding of instructions provided today and agreed to receive a mailed copy of patient instruction and/or educational materials. Telephone follow up appointment with pharmacy team member scheduled for: 5 months  Anson Crofts, PharmD Clinical Pharmacist [facility] [phone]

## 2019-07-07 ENCOUNTER — Telehealth: Payer: Self-pay | Admitting: Gastroenterology

## 2019-07-07 MED ORDER — OMEPRAZOLE 40 MG PO CPDR: 40.0000 mg | DELAYED_RELEASE_CAPSULE | Freq: Every day | ORAL | 2 refills | Status: DC

## 2019-07-07 NOTE — Telephone Encounter (Addendum)
Follow- up appointment has been scheduled and must be kept for 08/2019.Per [name] pt has been taking Omeprazole OTC 20mg - BID. Refill for Omeprazole 40mg  once daily has been sent to [facility]. [name] has been informed that pt will take 40 mg once daily, and aware of appointment in Aug 2021.

## 2019-07-13 ENCOUNTER — Other Ambulatory Visit: Payer: Self-pay

## 2019-07-13 ENCOUNTER — Ambulatory Visit: Payer: Medicare Other | Admitting: Cardiovascular Disease

## 2019-07-13 ENCOUNTER — Encounter: Payer: Self-pay | Admitting: Cardiovascular Disease

## 2019-07-13 DIAGNOSIS — R0602 Shortness of breath: Secondary | ICD-10-CM | POA: Diagnosis not present

## 2019-07-13 DIAGNOSIS — Z79899 Other long term (current) drug therapy: Secondary | ICD-10-CM | POA: Diagnosis not present

## 2019-07-13 DIAGNOSIS — Z951 Presence of aortocoronary bypass graft: Secondary | ICD-10-CM

## 2019-07-13 DIAGNOSIS — I25709 Atherosclerosis of coronary artery bypass graft(s), unspecified, with unspecified angina pectoris: Secondary | ICD-10-CM | POA: Diagnosis not present

## 2019-07-13 DIAGNOSIS — G4733 Obstructive sleep apnea (adult) (pediatric): Secondary | ICD-10-CM

## 2019-07-13 DIAGNOSIS — I5032 Chronic diastolic (congestive) heart failure: Secondary | ICD-10-CM

## 2019-07-13 DIAGNOSIS — E785 Hyperlipidemia, unspecified: Secondary | ICD-10-CM | POA: Diagnosis not present

## 2019-07-13 MED ORDER — ROSUVASTATIN CALCIUM 10 MG PO TABS: 10.0000 mg | ORAL_TABLET | Freq: Every day | ORAL | 3 refills | Status: DC

## 2019-07-13 NOTE — Progress Notes (Signed)
Patient ID: [name], male   DOB: 01/22/1940, 79 y.o.   MRN: [id number]      HPI: [name] is a 78 y.o. male who presents to the office today for a 3 month cardiology followup evaluation.  [name] has known CAD and underwent CABG revascularization surgery in 2001 with a LIMA to his LAD, vein to the marginal, vein to the RCA. Catheterization in 2004 showed patent grafts. An echo Doppler study in August 2012 revealed an ejection fraction of 40-45% with moderate septal hypokinesis with grade 1 diastolic dysfunction as well as aortic valve sclerosis. Additional problems include GERD, mild emphysema, as well as obstructive sleep apnea. He no longer utilizes CPAP. He has lost approximately 25 pounds since his wife's death from a motor vehicle accident in January 2013.  He remains active and notes mild shortness of breath with activity particularly when walking up hills but this has not changed.  He also is a history of restless leg syndrome and has benefited with requip. Laboratory in 2013 demonstrated LDL 57 with LDL particle #1035, small LDL particle number was 611, slightly increased, HDLC was 35 triglycerides 93 total cholesterol 111 initial resistance course slightly elevated at 53.  BP blood work in March 2015 revealed normal renal function.  Fasting glucose was minimally increased at 106.  Total cholesterol was 116 triglycerides 80, and LDL 66, with an HDL of 34.  LDL particle number was slightly increased at 1190.  He undewent a five-year follow-up nuclear perfusion study in November 2016.  Ejection fraction was 54%.  He had normal perfusion.  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  He admits to arthralgias without myalgias.  He has issues with his in his legs and his peripheral neuropathy.  I further titrated his losartan to 75 mg and Bystolic to 10 mg.  He believes his blood pressure and pulse have been better.   He admits to leg discomfort which seems worse with atorvastatin.  When I last saw him, he had noticed some shortness of breath with uphill walking and with exertion but denied recurrent episodes of chest tightness.   He denied PND, orthopnea.  He underwent a follow-up echo Doppler study on 10/28/2016.  Ejection fraction was excellent at 60-65%.  There was grade 2 diastolic dysfunction.  There was evidence for aortic sclerosis without stenosis.  There was mild LA dilation.  He had trivial PR.    I saw him in October 2018. He subsequently  underwent left hand surgery.  His primary care with Dr. Amador Cunas is retired.  When I last saw him in November 2019 recently, he  noticed progressive increase in shortness of breath and this seems to develop even trying to walk up very small hill.  He denied any definitive chest tightness.  He has started to run out of some of his chronic pain meds which had been prescribed by Dr. Amador Cunas.  He has not yet reestablished with another primary physician.  He was on Bystolic 10 mg and isosorbide 60 mg for his CAD and hypertension.  He was on Zetia 10 mg and rosuvastatin for hyperlipidemia.  He continues to be on chronic aspirin therapy.  He has restless legs and is on Requip.  His previous primary physician had prescribed Effexor in addition to Percocet.  In November 2019, with his change in symptomatology I recommended a follow-up echo Doppler study and a lexiscan  Myoview.  As he had had some issues with elevated potassium on ARB therapy and I started him on HCTZ other than spironolactone.  December 23, 2017 an echo Doppler study showed an EF of 60 to 65% with grade 1 diastolic dysfunction and mild aortic valve stenosis.  A Lexiscan Myoview study on January 04, 2018 was low risk and demonstrated normal perfusion without scar or ischemia.  EF was 51%.  He was hospitalized from March 11 through April 06, 2019 presenting with abdominal discomfort,.  Laboratory revealed  leukocytosis to 21,400, elevated lactic acid of 2.4, elevated lipase at 390 urinalysis notable for proteinuria and a creatinine had risen to 1.59 and there was mild elevation of AST and total bilirubin.  He was ultimately had positive blood cultures for coli to have sepsis and colitis.  An MRCP showed acute pancreatitis, mild gallbladder distention with peri-cholecystic fluid and gallbladder wall thickening.  School he underwent ERCP on April 01, 2018 which showed stricture and sludge without stone he underwent sphincterotomy and stent placement.  He is planned to be on antibiotics for several weeks.  During his hospitalization he developed acute kidney injury with peak creatinine at 2.27.  His medications were adjusted and on discharge creatinine was 1.75.    He was  evaluated by me in a telemedicine evaluation on May 19, 2018.  From a cardiac perspective he felt improved.  He was not having any leg swelling.  He denied any chest pain but continued to experience shortness of breath symptoms.  Creatinine had improved to 1.46.  He continues to have issues with back and leg discomfort and remotely had undergone steroid injections with [name].  I saw him in evaluation September 2020 at which time he continued  to experience his chronic shortness of breath but that has not increased in severity. He denies any chest pain. He has had some issues with swallowing and continues to see GI physician. He has undergone esophageal dilatation. He is unaware of any rhythm disturbance. He has been evaluated by [name] and he was referred for a lower extremity Doppler study which was negative for DVT. He had been noted to have a left pleural effusion which is followed by 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  He eventually underwent robotic cholecystectomy by Dr. Windle Guard on 01/02/2019.  He has biliary stents in place for distal biliary stricture. After his procedure, he developed a bilateral  pneumonia and shortness of breath with increasing troponin. CT of the chest was negative for PE.Patient was treated with IV Lasix and antibiotic. Troponin was elevated and he subsequently underwent cardiac catheterization on 01/05/2019 by Dr. Saunders Revel which showed 99% subtotally occluded SVG-RPDA/RPL. This was treated with 3 overlapping resolute Onyx drug-eluting stents. Postprocedure, it was recommended to continue aspirin and Plavix indefinitely and if the patient subsequently developed refractory symptoms there was consideration for PCI of ostial left main, left circumflex and OM1. Echocardiogram obtained on the same day showed EF 40 to 45%, severe hypokinesis of the entire inferior and inferoseptal wall, grade 1 DD, mild MR, cannot exclude ASD/PFO.  He was seen in follow-up by Almyra Deforest on January 19, 2019. He continued to have fatigue however gradually improving. He previously had a epigastric pain which he attributed to acid reflux which retrospectively may have been his angina since there was complete resolution of this discomfort following his hospitalization. He was walking at home multiple times throughout the day without any issue.   He was seen in  a telemedicine evaluation in March 2021 and over the several months prior to that evaluation he denied any recurrent chest pain episodes.  However he admitted to feeling "rough."  He was getting physical therapy times per week.  He has been staying at home all the time.  His heart rate has been regular.  He is not aware of any palpitations.  He had not been using CPAP.   Over the past several months, he continues to feel weak and essentially "hurts all over."  He states he has aches in his legs, back and shoulders.  Oftentimes he notices his legs hurt worse before he has to go to the bathroom.  Denies any anginal symptoms.  He is unaware of any cardiac arrhythmia.  He denies presyncope or syncope.  He presents for reevaluation.  Past Medical  History:  Diagnosis Date  . Allergy   . B12 deficiency anemia   . Blood transfusion without reported diagnosis   . CAD (coronary artery disease)   . Cancer ([place])    bladder-   . Colon polyps   . COPD (chronic obstructive pulmonary disease) ([place])   . Depression   . Esophagus, Barrett's   . GERD (gastroesophageal reflux disease)   . History of bladder cancer    Bladder cancer "8 times"  . History of hiatal hernia   . Hyperlipidemia   . Hypertension   . Localized osteoarthrosis, lower leg   . Restless leg syndrome   . Sleep apnea    does not wear cpap  . Stenosis of esophagus     Past Surgical History:  Procedure Laterality Date  . BILIARY BRUSHING  04/01/2018   Procedure: BILIARY BRUSHING;  Surgeon: Rush Landmark Telford Nab., MD;  Location: [place];  Service: Gastroenterology;;  . BILIARY BRUSHING  09/12/2018   Procedure: BILIARY BRUSHING;  Surgeon: Irving Copas., MD;  Location: [place];  Service: Gastroenterology;;  . BILIARY BRUSHING  11/28/2018   Procedure: BILIARY BRUSHING;  Surgeon: Irving Copas., MD;  Location: [place];  Service: Gastroenterology;;  . BILIARY DILATION  09/12/2018   Procedure: BILIARY DILATION;  Surgeon: Irving Copas., MD;  Location: [place];  Service: Gastroenterology;;  . BILIARY DILATION  11/28/2018   Procedure: BILIARY DILATION;  Surgeon: Irving Copas., MD;  Location: [place];  Service: Gastroenterology;;  . BILIARY STENT PLACEMENT  04/01/2018   Procedure: BILIARY STENT PLACEMENT;  Surgeon: Irving Copas., MD;  Location: [place];  Service: Gastroenterology;;  . BILIARY STENT PLACEMENT  09/12/2018   Procedure: BILIARY STENT PLACEMENT;  Surgeon: Irving Copas., MD;  Location: [place];  Service: Gastroenterology;;  . BILIARY STENT PLACEMENT  11/28/2018   Procedure: BILIARY STENT PLACEMENT;  Surgeon: Irving Copas., MD;  Location: [place];  Service:  Gastroenterology;;  . BIOPSY  04/01/2018   Procedure: BIOPSY;  Surgeon: Irving Copas., MD;  Location: [place];  Service: Gastroenterology;;  . BIOPSY  09/12/2018   Procedure: BIOPSY;  Surgeon: Irving Copas., MD;  Location: [place];  Service: Gastroenterology;;  . BIOPSY  11/28/2018   Procedure: BIOPSY;  Surgeon: Irving Copas., MD;  Location: [facility] ENDOSCOPY;  Service: Gastroenterology;;  . bladder cancer      x 8 cystoscopy  . CERVICAL DISCECTOMY     ACDF  . COLONOSCOPY  11/17/2005   normal   . CORONARY ARTERY BYPASS GRAFT     x4  . CORONARY STENT INTERVENTION N/A 01/05/2019   Procedure: CORONARY STENT INTERVENTION;  Surgeon:  End, Harrell Gave, MD;  Location: [place] [facility];  Service: Cardiovascular;  Laterality: N/A;  . ENDOSCOPIC MUCOSAL RESECTION  09/12/2018   Procedure: ENDOSCOPIC MUCOSAL RESECTION;  Surgeon: Rush Landmark, Telford Nab., MD;  Location: [facility] ENDOSCOPY;  Service: Gastroenterology;;  . ENDOSCOPIC RETROGRADE CHOLANGIOPANCREATOGRAPHY (ERCP) WITH PROPOFOL N/A 04/01/2018   Procedure: ENDOSCOPIC RETROGRADE CHOLANGIOPANCREATOGRAPHY (ERCP) WITH PROPOFOL;  Surgeon: Irving Copas., MD;  Location: [place];  Service: Gastroenterology;  Laterality: N/A;  . ENDOSCOPIC RETROGRADE CHOLANGIOPANCREATOGRAPHY (ERCP) WITH PROPOFOL N/A 09/12/2018   Procedure: ENDOSCOPIC RETROGRADE CHOLANGIOPANCREATOGRAPHY (ERCP) WITH PROPOFOL;  Surgeon: Rush Landmark Telford Nab., MD;  Location: [place];  Service: Gastroenterology;  Laterality: N/A;  . ERCP N/A 11/28/2018   Procedure: ENDOSCOPIC RETROGRADE CHOLANGIOPANCREATOGRAPHY (ERCP) +EGD with spyglass;  Surgeon: Rush Landmark Telford Nab., MD;  Location: [place];  Service: Gastroenterology;  Laterality: N/A;  . ESOPHAGOGASTRODUODENOSCOPY  04/29/2010  . ESOPHAGOGASTRODUODENOSCOPY (EGD) WITH PROPOFOL N/A 04/01/2018   Procedure: ESOPHAGOGASTRODUODENOSCOPY (EGD) WITH PROPOFOL;  Surgeon: Rush Landmark Telford Nab., MD;   Location: [place];  Service: Gastroenterology;  Laterality: N/A;  . ESOPHAGOGASTRODUODENOSCOPY (EGD) WITH PROPOFOL N/A 09/12/2018   Procedure: ESOPHAGOGASTRODUODENOSCOPY (EGD) WITH PROPOFOL;  Surgeon: Rush Landmark Telford Nab., MD;  Location: [place];  Service: Gastroenterology;  Laterality: N/A;  . ESOPHAGOGASTRODUODENOSCOPY (EGD) WITH PROPOFOL N/A 11/28/2018   Procedure: ESOPHAGOGASTRODUODENOSCOPY (EGD) WITH PROPOFOL;  Surgeon: Rush Landmark Telford Nab., MD;  Location: [place];  Service: Gastroenterology;  Laterality: N/A;  . EUS  04/01/2018   Procedure: FULL UPPER ENDOSCOPIC ULTRASOUND (EUS) RADIAL;  Surgeon: Irving Copas., MD;  Location: [place];  Service: Gastroenterology;;  . EUS N/A 09/12/2018   Procedure: UPPER ENDOSCOPIC ULTRASOUND (EUS) RADIAL;  Surgeon: Irving Copas., MD;  Location: [place];  Service: Gastroenterology;  Laterality: N/A;  . FINE NEEDLE ASPIRATION  09/12/2018   Procedure: FINE NEEDLE ASPIRATION (FNA) LINEAR;  Surgeon: Irving Copas., MD;  Location: [place];  Service: Gastroenterology;;  . HEMOSTASIS CLIP PLACEMENT  09/12/2018   Procedure: HEMOSTASIS CLIP PLACEMENT;  Surgeon: Irving Copas., MD;  Location: [facility] ENDOSCOPY;  Service: Gastroenterology;;  . I & D EXTREMITY Left 11/24/2016   Procedure: IRRIGATION AND DEBRIDEMENT LEFT HAND, THUMB, INDEX, MIDDLE, RING, AND SMALL FINGERS WITH RECONSTRUCTION;  Surgeon: Roseanne Kaufman, MD;  Location: [place];  Service: Orthopedics;  Laterality: Left;  . KNEE ARTHROSCOPY Left   . LEFT HEART CATH AND CORS/GRAFTS ANGIOGRAPHY N/A 01/05/2019   Procedure: LEFT HEART CATH AND CORS/GRAFTS ANGIOGRAPHY;  Surgeon: Nelva Bush, MD;  Location: [place] [facility];  Service: Cardiovascular;  Laterality: N/A;  . LUMBAR LAMINECTOMY     and fusion x 2  . NASAL SINUS SURGERY    . POPLITEAL SYNOVIAL CYST EXCISION    . REMOVAL OF STONES  04/01/2018   Procedure: REMOVAL OF STONES;  Surgeon:  Rush Landmark Telford Nab., MD;  Location: [place];  Service: Gastroenterology;;  . REMOVAL OF STONES  09/12/2018   Procedure: REMOVAL OF STONES;  Surgeon: Irving Copas., MD;  Location: [place];  Service: Gastroenterology;;  . REMOVAL OF STONES  11/28/2018   Procedure: REMOVAL OF STONES;  Surgeon: Irving Copas., MD;  Location: [place];  Service: Gastroenterology;;  . Azzie Almas DILATION N/A 09/12/2018   Procedure: Azzie Almas DILATION;  Surgeon: Irving Copas., MD;  Location: [place];  Service: Gastroenterology;  Laterality: N/A;  . SAVORY DILATION N/A 11/28/2018   Procedure: SAVORY DILATION;  Surgeon: Rush Landmark Telford Nab., MD;  Location: [place];  Service: Gastroenterology;  Laterality: N/A;  . SPHINCTEROTOMY  04/01/2018  Procedure: SPHINCTEROTOMY;  Surgeon: Rush Landmark Telford Nab., MD;  Location: [place];  Service: Gastroenterology;;  . Bess Kinds CHOLANGIOSCOPY N/A 11/28/2018   Procedure: GEXBMWUX CHOLANGIOSCOPY;  Surgeon: Irving Copas., MD;  Location: [place];  Service: Gastroenterology;  Laterality: N/A;  . STENT REMOVAL  09/12/2018   Procedure: STENT REMOVAL;  Surgeon: Irving Copas., MD;  Location: [place];  Service: Gastroenterology;;  . Lavell Islam REMOVAL  11/28/2018   Procedure: STENT REMOVAL;  Surgeon: Irving Copas., MD;  Location: [place];  Service: Gastroenterology;;  . Lia Foyer LIFTING INJECTION  09/12/2018   Procedure: SUBMUCOSAL LIFTING INJECTION;  Surgeon: Irving Copas., MD;  Location: [facility] ENDOSCOPY;  Service: Gastroenterology;;    Allergies  Allergen Reactions  . Losartan Potassium Other (See Comments)    Hyperkalemia  . Tape Itching and Rash    reddened skin     Current Outpatient Medications  Medication Sig Dispense Refill  . acetaminophen (TYLENOL) 325 MG tablet Take 650 mg by mouth every 6 (six) hours as needed for mild pain.    [name] amLODipine (NORVASC) 2.5 MG tablet Take 2  tablets (5 mg total) by mouth daily. 90 tablet 2  . aspirin EC 81 MG tablet Take 81 mg by mouth at bedtime.    [name] BYSTOLIC 10 MG tablet Take 1 tablet by mouth once daily 45 tablet 0  . clopidogrel (PLAVIX) 75 MG tablet Take 1 tablet (75 mg total) by mouth daily. 90 tablet 3  . cyanocobalamin (,VITAMIN B-12,) 1000 MCG/ML injection INJECT 1ML INTO THE MUSCLE FOR 1 DOSE 6 mL 1  . dicyclomine (BENTYL) 10 MG capsule Take 1 capsule (10 mg total) by mouth 4 (four) times daily -  before meals and at bedtime. 90 capsule 2  . doxycycline (VIBRAMYCIN) 100 MG capsule Take 1 capsule (100 mg total) by mouth 2 (two) times daily. 20 capsule 0  . ezetimibe (ZETIA) 10 MG tablet Take 1 tablet (10 mg total) by mouth daily. 90 tablet 1  . Fiber Complete TABS Take 1 each by mouth daily.     . fluticasone (FLONASE) 50 MCG/ACT nasal spray Place 1-2 sprays into both nostrils at bedtime as needed for allergies or rhinitis.    . hydrochlorothiazide (MICROZIDE) 12.5 MG capsule Take 1 capsule (12.5 mg total) by mouth once a week. 15 capsule 2  . isosorbide mononitrate (IMDUR) 60 MG 24 hr tablet Take 1 tablet by mouth once daily 90 tablet 3  . loratadine (CLARITIN) 10 MG tablet Take 10 mg by mouth daily.    . Menthol, Topical Analgesic, (BENGAY EX) Apply 1 application topically daily as needed (pain).    . Multiple Vitamins-Minerals (MULTIVITAMIN WITH MINERALS) tablet Take 1 tablet by mouth daily.    . Naphazoline-Pheniramine (OPCON-A) 0.027-0.315 % SOLN Place 1 drop into both eyes daily as needed (itching eyes).    . NEEDLE, DISP, 25 G (B-D DISP NEEDLE 25GX1") 25G X 1" MISC Inject 1000 mcg into muscle once a month. 50 each 0  . nitroGLYCERIN (NITROSTAT) 0.4 MG SL tablet Place 1 tablet (0.4 mg total) under the tongue every 5 (five) minutes as needed for chest pain. 30 tablet 3  . omeprazole (PRILOSEC) 40 MG capsule Take 1 capsule (40 mg total) by mouth daily. 30 capsule 2  . oxyCODONE-acetaminophen (PERCOCET) 10-325 MG tablet  Take 1 tablet by mouth 2 (two) times daily as needed for pain. 60 tablet 0  . Probiotic Product (PROBIOTIC ADVANCED PO) Take 1 tablet by mouth 2 (two) times daily.     [name]  rOPINIRole (REQUIP) 2 MG tablet TAKE 1 TABLET BY MOUTH AT BEDTIME 90 tablet 0  . rosuvastatin (CRESTOR) 10 MG tablet Take 1 tablet (10 mg total) by mouth daily. 90 tablet 3  . saccharomyces boulardii (FLORASTOR) 250 MG capsule Take 1 capsule (250 mg total) by mouth 2 (two) times daily. 60 capsule 2  . venlafaxine (EFFEXOR) 75 MG tablet Take 1 tablet (75 mg total) by mouth daily. 90 tablet 1   No current facility-administered medications for this visit.   Socially he is widowed in January 2013. He has 3 children 6 grandchildren. 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 There is no recent tobacco use.  no alcohol.  His daughter is the wife of my patient Mr. Theodosia Paling.  ROS General: Negative; No fevers, chills, or night sweats;  HEENT: Negative; No changes in vision or hearing, sinus congestion, difficulty swallowing Pulmonary: Negative; No cough, wheezing, shortness of breath, hemoptysis Cardiovascular: Negative; No chest pain, presyncope, syncope, palpatations.  Minimal shortness of breath with activity GI: GERD; recent pancreatitis and septic cholangitis GU: Recent acute kidney injury Musculoskeletal: Positive for multiple joint aches involving his legs, hips, back, and shoulder Hematologic/Oncology: Negative; no easy bruising, bleeding Endocrine: Negative; no heat/cold intolerance; no diabetes Neuro: Negative; no changes in balance, headaches Skin: Negative; No rashes or skin lesions Psychiatric: Negative; No behavioral problems, depression Sleep: He no longer uses CPAP with his 25 pound weight loss.  His restless legs improved with low-dose Requip; No snoring, daytime sleepiness, hypersomnolence, bruxism, hypnogognic hallucinations, no  cataplexy Other comprehensive 14 point system review is negative.   PE BP 120/60   Pulse (!) 58   Temp 98.1 F (36.7 C)   Ht '5\' 9"'$  (1.753 m)   Wt 180 lb 3.2 oz (81.7 kg)   SpO2 94%   BMI 26.61 kg/m    Repeat blood pressure by me was 130/64  Wt Readings from Last 3 Encounters:  07/13/19 180 lb 3.2 oz (81.7 kg)  05/10/19 180 lb 12.8 oz (82 kg)  05/08/19 180 lb 11.2 oz (82 kg)   General: Alert, oriented, no distress.  Skin: normal turgor, no rashes, warm and dry HEENT: Normocephalic, atraumatic. Pupils equal round and reactive to light; sclera anicteric; extraocular muscles intact;  Nose without nasal septal hypertrophy Mouth/Parynx benign; Mallinpatti scale 3 Neck: No JVD, no carotid bruits; normal carotid upstroke Lungs: clear to ausculatation and percussion; no wheezing or rales Chest wall: without tenderness to palpitation Heart: PMI not displaced, RRR, s1 s2 normal, 6-2/8 systolic murmur, no diastolic murmur, no rubs, gallops, thrills, or heaves Abdomen: soft, nontender; no hepatosplenomehaly, BS+; abdominal aorta nontender and not dilated by palpation. Back: no CVA tenderness Pulses 2+ Musculoskeletal: full range of motion, normal strength, no joint deformities Extremities: no clubbing cyanosis or edema, Homan's sign negative  Neurologic: grossly nonfocal; Cranial nerves grossly wnl Psychologic: Normal mood and affect   ECG (independently read by me): Sinus bradycardia 58 bpm.  Q wave in lead III.  QTc interval 426 ms.  No ectopy.  October 10, 2018 ECG (independently read by me): Sinus rhythm with mild sinus arrhythmia at 71 bpm.  QTc interval 465 ms.  No significant ST changes.  No ectopy.  December 13, 2017 ECG (independently read by me): NSR at 64; IRBBB; normal intervals  July 2018 ECG (independently read by me): Sinus bradycardia 53 bpm.  No ectopy.  Normal intervals.  No ST segment changes.  December 2017 ECG (independently read by me): Normal sinus rhythm  at 74 bpm.  No ectopy.  Normal intervals.  No ST segment changes.  October 2016 ECG (independently read by me): Normal sinus rhythm at 61 bpm.  No ectopy.  Normal intervals.  May 2016 ECG (independently read by me): Sinus bradycardia 56 bpm.  No ectopy.  Mild RV conduction delay.  QTc interval 395  ECG (independently read by me): sinus bradycardia 56 bpm.  Mild RV conduction delay.  Borderline first-degree AV block with a PR interval at 208 ms.  No ectopy.  Prior 06/05/2013 ECG (independently read by me): Normal sinus rhythm at 63 beats per minute.  No ectopy; normal intervals.  Prior November 2014 ECG: Sinus rhythm at 68beats per minute. There is mild RV conduction delay. Intervals were normal.  LABS:  BMP Latest Ref Rng & Units 05/10/2019 01/19/2019 01/08/2019  Glucose 65 - 99 mg/dL 134(H) 175(H) 132(H)  BUN 8 - 27 mg/dL '20 19 19  '$ Creatinine 0.76 - 1.27 mg/dL 1.51(H) 1.56(H) 1.44(H)  BUN/Creat Ratio 10 - '24 13 12 '$ -  Sodium 134 - 144 mmol/L 143 140 143  Potassium 3.5 - 5.2 mmol/L 4.3 4.1 3.8  Chloride 96 - 106 mmol/L 104 102 104  CO2 20 - 29 mmol/L '27 24 28  '$ Calcium 8.6 - 10.2 mg/dL 9.1 9.4 8.4(L)   Hepatic Function Latest Ref Rng & Units 01/04/2019 12/29/2018 12/12/2018  Total Protein 6.5 - 8.1 g/dL 7.6 7.5 -  Albumin 3.5 - 5.0 g/dL 3.1(L) 3.8 -  AST 15 - 41 U/L 42(H) 21 18  ALT 0 - 44 U/L '25 14 9  '$ Alk Phosphatase 38 - 126 U/L 66 64 73  Total Bilirubin 0.3 - 1.2 mg/dL 0.8 0.8 -  Bilirubin, Direct 0.0 - 0.3 mg/dL - - 0.1    CBC Latest Ref Rng & Units 05/10/2019 01/19/2019 01/08/2019  WBC 3.4 - 10.8 x10E3/uL 6.2 7.8 6.8  Hemoglobin 13.0 - 17.7 g/dL 11.6(L) 11.5(L) 9.2(L)  Hematocrit 37.5 - 51.0 % 36.6(L) 35.4(L) 28.4(L)  Platelets 150 - 450 x10E3/uL 168 229 198     Lipid Panel     Component Value Date/Time   CHOL 80 03/31/2018 0508   CHOL 99 (L) 12/24/2017 0819   CHOL 116 03/30/2013 1053   TRIG 72 03/31/2018 0508   TRIG 80 03/30/2013 1053   HDL 32 (L) 03/31/2018 0508    HDL 28 (L) 12/24/2017 0819   HDL 34 (L) 03/30/2013 1053   CHOLHDL 2.5 03/31/2018 0508   VLDL 14 03/31/2018 0508   LDLCALC 34 03/31/2018 0508   LDLCALC 32 12/24/2017 0819   LDLCALC 66 03/30/2013 1053     RADIOLOGY: No results found.  ECHO 04/10/2119 IMPRESSIONS  1. Left ventricular ejection fraction, by estimation, is 50 to 55%. The  left ventricle has low normal function. The left ventricle has no regional  wall motion abnormalities. Left ventricular diastolic parameters are  consistent with Grade II diastolic  dysfunction (pseudonormalization).  2. Right ventricular systolic function is normal. The right ventricular  size is normal. There is moderately elevated pulmonary artery systolic  pressure.  3. Left atrial size was moderately dilated.  4. The mitral valve is normal in structure. Trivial mitral valve  regurgitation. No evidence of mitral stenosis.  5. The aortic valve is grossly normal. Aortic valve regurgitation is  trivial. No aortic stenosis is present.   IMPRESSION:  1. 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 Hx of CABG   3. Diastolic CHF, chronic ([place])   4. Hyperlipidemia LDL goal <70   5. OSA (obstructive sleep apnea)   6. Medication management   7. SOB (shortness of breath)     ASSESSMENT AND PLAN:  [name] is a 79 year old Caucasian male who has known CAD and is 20 years status post his CABG revascularization surgery in 2001. His catheterization in 2004 for recurrent chest pain showed patent grafts. His ejection fraction of 40-45% was documented in August 2012.  In October 2018 an echo revealed an EF of 60 to 65% with grade 2 diastolic dysfunction, aortic valve sclerosis without stenosis mild left atrial dilation.   His  echo in December 2019 continued to show an EF of 60 to 65%.  There was now grade 1 diastolic dysfunction.  There was mild aortic stenosis. A Lexiscan Myoview study on  January 04, 2018 was low risk and showed an EF of 51% without ischemia.  He developed  pancreatitis and felt to have acsending cholangitis with E. coli sepsis.  He underwent sphincterotomy and stent placement in his bile duct by Dr. Justice Britain.  He has continued to have issues with significant shortness of breath which is stable but chronic.  I again reviewed his most recent catheterization from January 05, 2019 which showed a 99% subtotally occluded vein graft to his right PDA/PL vessel which was treated successfully with 3 overlapping Resolute Onyx DES stents.  It is recommended that he continue aspirin/Plavix indefinitely.  Echo Doppler study on the same day showed an EF of 40 to 45%.  Presently he has been without recurrent anginal symptoms.  His major issue now is that he "hurts all over."  He has been on rosuvastatin 20 mg and I will reduce his dose to 10 mg in the event that statins are contributing to some of his overall discomfort.  He continues to be on Zetia.  Target LDL is less than 70.  I recommended the addition of coenzyme every 10.  Have also suggested that we check laboratory with fasting labs, comprehensive metabolic panel CBC, vitamin D level, TSH, and lipid studies.  His ECG remained stable and shows sinus bradycardia at 58.  He continues to be on Bystolic 10 mg in addition to his isosorbide 60 mg, amlodipine 5 mg hydrochlorothiazide 12.5 mg once a week in addition to his aspirin and Plavix.  He is no longer using CPAP therapy for his OSA.  He believes he is sleeping adequately..  I will see him in 6 months for follow-up evaluation or sooner as needed.   Troy Sine, MD, [facility]  07/14/2019 5:14 PM

## 2019-07-13 NOTE — Patient Instructions (Signed)
Medication Instructions:  DECREASE YOUR ROSUVASTATIN TO 10MG  DAILY  BEGIN TAKING COQ10 DAILY- OTC (USUALLY COMES IN 300MG )  *If you need a refill on your cardiac medications before your next appointment, please call your pharmacy*   Lab Work: FASTING LABS: CMET CBC TSH LIPID VIT D  If you have labs (blood work) drawn today and your tests are completely normal, you will receive your results only by: [name] MyChart Message (if you have MyChart) OR . A paper copy in the mail If you have any lab test that is abnormal or we need to change your treatment, we will call you to review the results.   Follow-Up: IN DECEMBER At [facility], you and your health needs are our priority.  As part of our continuing mission to provide you with exceptional heart care, we have created designated Provider Care Teams.  These Care Teams include your primary Cardiologist (physician) and Advanced Practice Providers (APPs -  Physician Assistants and Nurse Practitioners) who all work together to provide you with the care you need, when you need it.  We recommend signing up for the patient portal called "MyChart".  Sign up information is provided on this After Visit Summary.  MyChart is used to connect with patients for Virtual Visits (Telemedicine).  Patients are able to view lab/test results, encounter notes, upcoming appointments, etc.  Non-urgent messages can be sent to your provider as well.   To learn more about what you can do with MyChart, go to [email or web address].    Your next appointment:   6 month(s)  The format for your next appointment:   In Person  Provider:   [name], MD

## 2019-07-14 ENCOUNTER — Encounter: Payer: Self-pay | Admitting: Cardiovascular Disease

## 2019-07-20 DIAGNOSIS — I25709 Atherosclerosis of coronary artery bypass graft(s), unspecified, with unspecified angina pectoris: Secondary | ICD-10-CM | POA: Diagnosis not present

## 2019-07-20 DIAGNOSIS — Z951 Presence of aortocoronary bypass graft: Secondary | ICD-10-CM | POA: Diagnosis not present

## 2019-07-20 DIAGNOSIS — I2510 Atherosclerotic heart disease of native coronary artery without angina pectoris: Secondary | ICD-10-CM | POA: Diagnosis not present

## 2019-07-20 DIAGNOSIS — I5032 Chronic diastolic (congestive) heart failure: Secondary | ICD-10-CM | POA: Diagnosis not present

## 2019-07-20 DIAGNOSIS — E7800 Pure hypercholesterolemia, unspecified: Secondary | ICD-10-CM | POA: Diagnosis not present

## 2019-07-20 DIAGNOSIS — G4733 Obstructive sleep apnea (adult) (pediatric): Secondary | ICD-10-CM | POA: Diagnosis not present

## 2019-07-20 DIAGNOSIS — R0602 Shortness of breath: Secondary | ICD-10-CM | POA: Diagnosis not present

## 2019-07-20 DIAGNOSIS — Z79899 Other long term (current) drug therapy: Secondary | ICD-10-CM | POA: Diagnosis not present

## 2019-07-21 LAB — COMPREHENSIVE METABOLIC PANEL
ALT: 7 IU/L (ref 0–44)
AST: 17 IU/L (ref 0–40)
Albumin/Globulin Ratio: 1.3 (ref 1.2–2.2)
Albumin: 4.1 g/dL (ref 3.7–4.7)
Alkaline Phosphatase: 83 IU/L (ref 48–121)
BUN/Creatinine Ratio: 12 (ref 10–24)
BUN: 18 mg/dL (ref 8–27)
Bilirubin Total: 0.4 mg/dL (ref 0.0–1.2)
CO2: 25 mmol/L (ref 20–29)
Calcium: 9 mg/dL (ref 8.6–10.2)
Chloride: 100 mmol/L (ref 96–106)
Creatinine, Ser: 1.46 mg/dL — ABNORMAL HIGH (ref 0.76–1.27)
GFR calc Af Amer: 53 mL/min/{1.73_m2} — ABNORMAL LOW (ref >59)
GFR calc non Af Amer: 46 mL/min/{1.73_m2} — ABNORMAL LOW (ref >59)
Globulin, Total: 3.2 g/dL (ref 1.5–4.5)
Glucose: 125 mg/dL — ABNORMAL HIGH (ref 65–99)
Potassium: 4.7 mmol/L (ref 3.5–5.2)
Sodium: 142 mmol/L (ref 134–144)
Total Protein: 7.3 g/dL (ref 6.0–8.5)

## 2019-07-21 LAB — VITAMIN D 25 HYDROXY (VIT D DEFICIENCY, FRACTURES): Vit D, 25-Hydroxy: 28.9 ng/mL — ABNORMAL LOW (ref 30.0–100.0)

## 2019-07-21 LAB — CBC
Hematocrit: 33.8 % — ABNORMAL LOW (ref 37.5–51.0)
Hemoglobin: 11.4 g/dL — ABNORMAL LOW (ref 13.0–17.7)
MCH: 26.9 pg (ref 26.6–33.0)
MCHC: 33.7 g/dL (ref 31.5–35.7)
MCV: 80 fL (ref 79–97)
Platelets: 177 10*3/uL (ref 150–450)
RBC: 4.24 x10E6/uL (ref 4.14–5.80)
RDW: 14.1 % (ref 11.6–15.4)
WBC: 8.7 10*3/uL (ref 3.4–10.8)

## 2019-07-21 LAB — LIPID PANEL
Chol/HDL Ratio: 3.5 ratio (ref 0.0–5.0)
Cholesterol, Total: 113 mg/dL (ref 100–199)
HDL: 32 mg/dL — ABNORMAL LOW (ref >39)
LDL Chol Calc (NIH): 56 mg/dL (ref 0–99)
Triglycerides: 145 mg/dL (ref 0–149)
VLDL Cholesterol Cal: 25 mg/dL (ref 5–40)

## 2019-07-21 LAB — TSH: TSH: 3.04 u[IU]/mL (ref 0.450–4.500)

## 2019-07-31 ENCOUNTER — Other Ambulatory Visit: Payer: Self-pay | Admitting: Cardiovascular Disease

## 2019-07-31 DIAGNOSIS — R0600 Dyspnea, unspecified: Secondary | ICD-10-CM

## 2019-07-31 DIAGNOSIS — R0609 Other forms of dyspnea: Secondary | ICD-10-CM

## 2019-08-05 ENCOUNTER — Other Ambulatory Visit: Payer: Self-pay | Admitting: Internal Medicine

## 2019-08-16 DIAGNOSIS — N35113 Postinfective membranous urethral stricture, not elsewhere classified: Secondary | ICD-10-CM | POA: Diagnosis not present

## 2019-08-27 IMAGING — CT CT ABD-PELV W/O CM
2 of 4 series · 17 of 46 positions shown, 19 images · non-contrast
Comparison: 06/21/2009 from [HOSPITAL]

CLINICAL DATA: Chronic right lower quadrant and groin pain, with
worsening over past 5 months. Loose stools. Personal history of
bladder carcinoma.

EXAM:
CT ABDOMEN AND PELVIS WITHOUT CONTRAST
TECHNIQUE: Multidetector CT imaging of the abdomen and pelvis was performed
following the standard protocol without IV contrast.

[Series 2: axial st · axial · 0.83mm/px · z∈[-412,+22]mm · 14 of 97 slices shown, 16 images]
[im 5/97  soft-tissue]
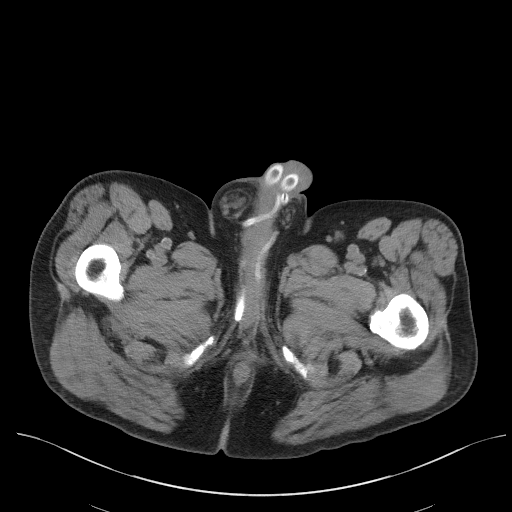
[im 5/97  bone]
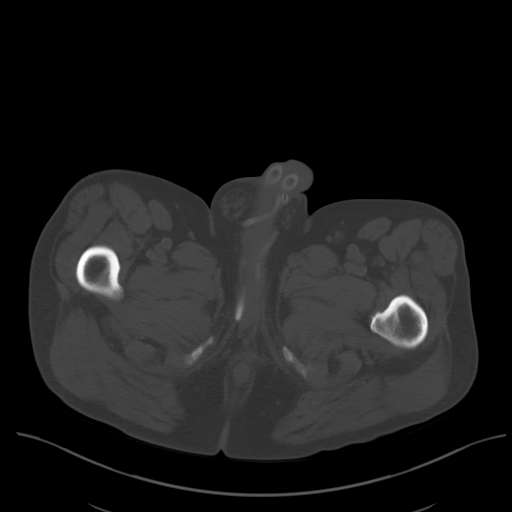
[im 15/97  soft-tissue]
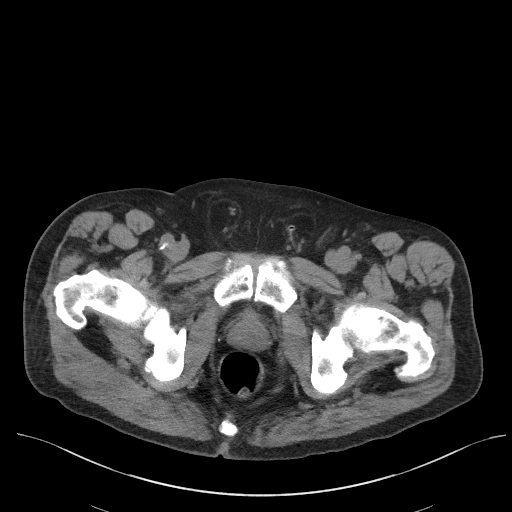
[im 20/97  soft-tissue]
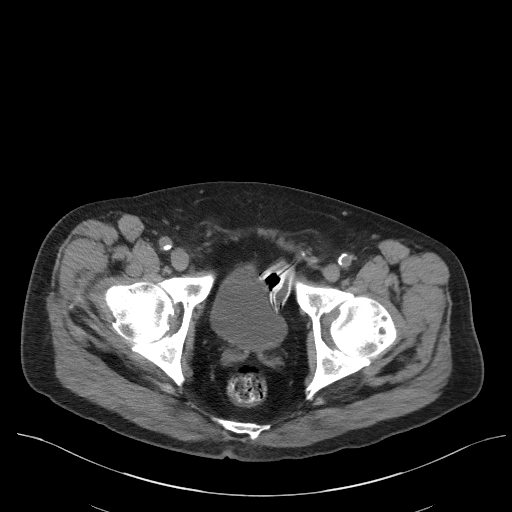
[im 25/97  soft-tissue]
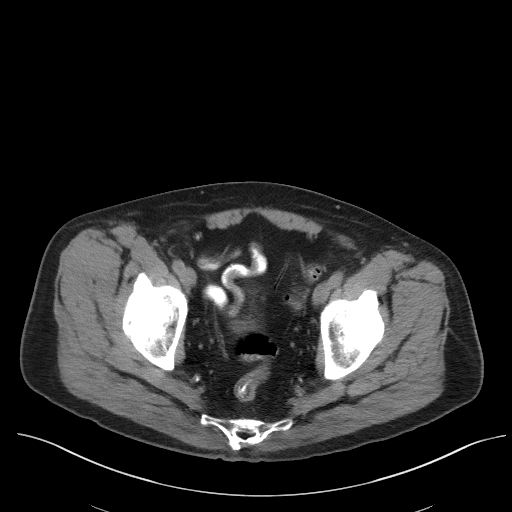
[im 34/97  soft-tissue]
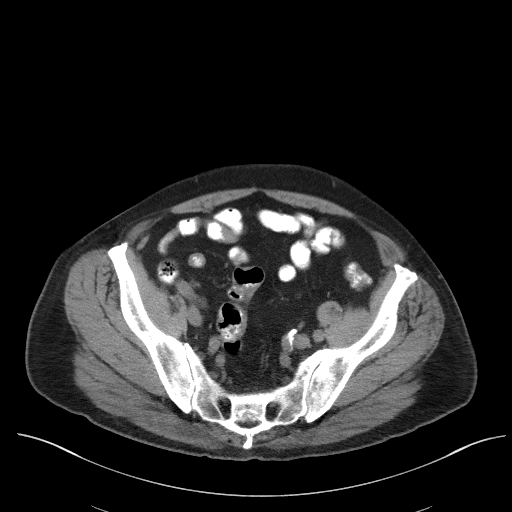
[im 39/97  soft-tissue]
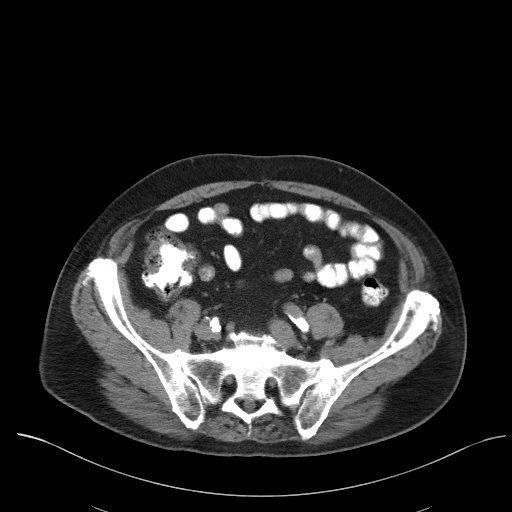
[im 44/97  soft-tissue]
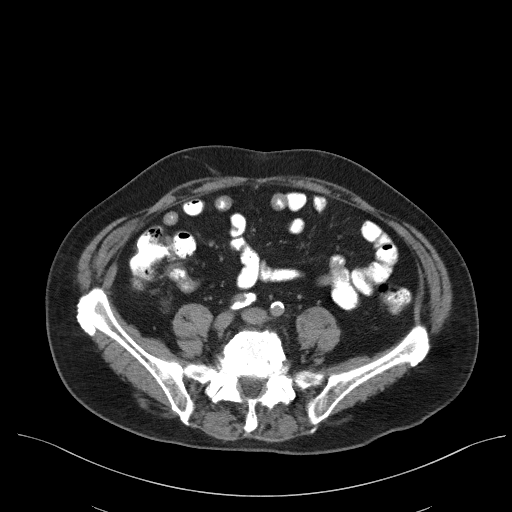
[im 53/97  soft-tissue]
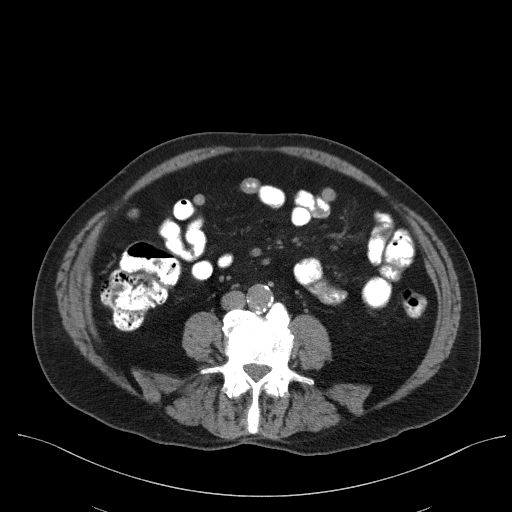
[im 58/97  soft-tissue]
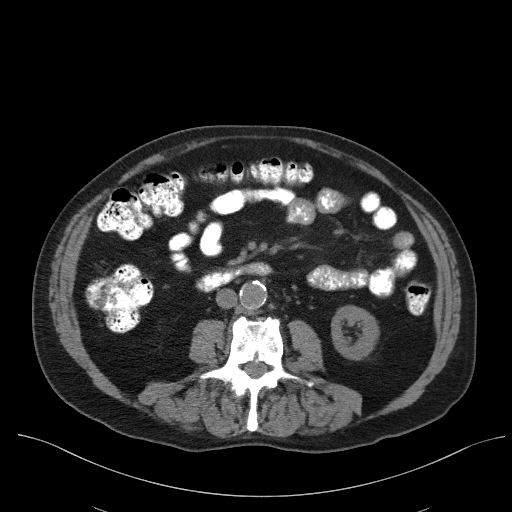
[im 58/97  bone]
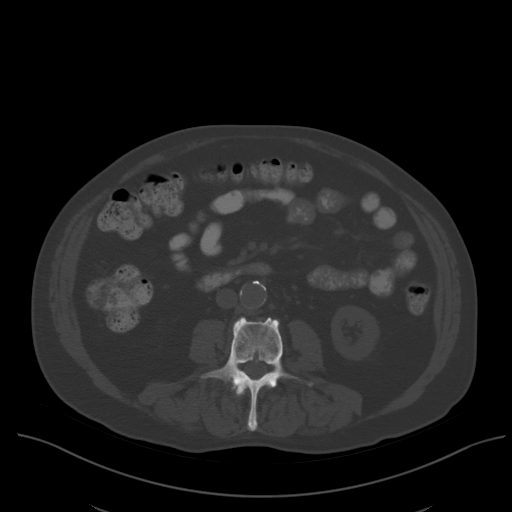
[im 63/97  soft-tissue]
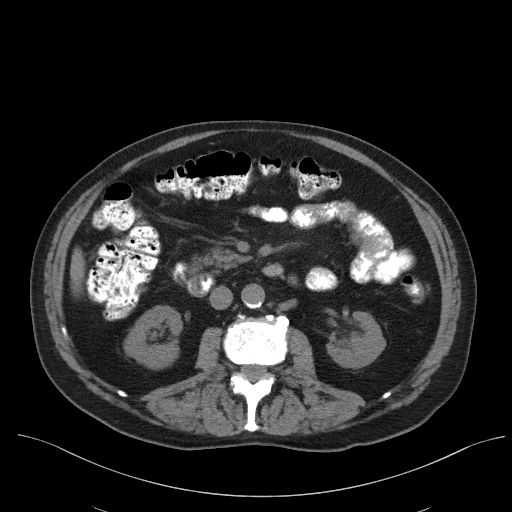
[im 73/97  soft-tissue]
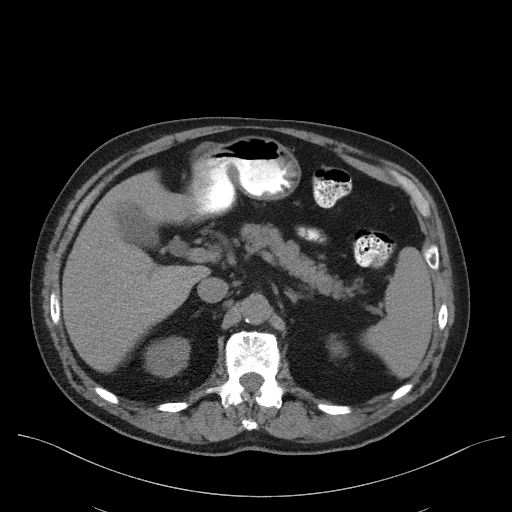
[im 77/97  soft-tissue]
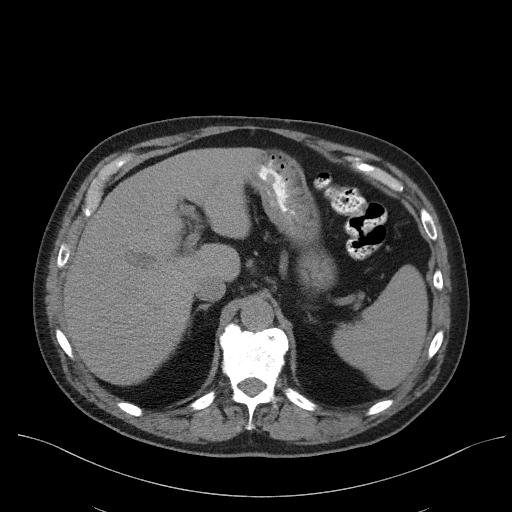
[im 82/97  soft-tissue]
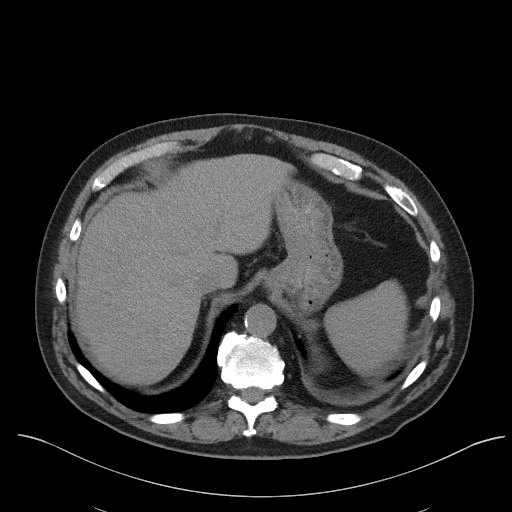
[im 92/97  soft-tissue]
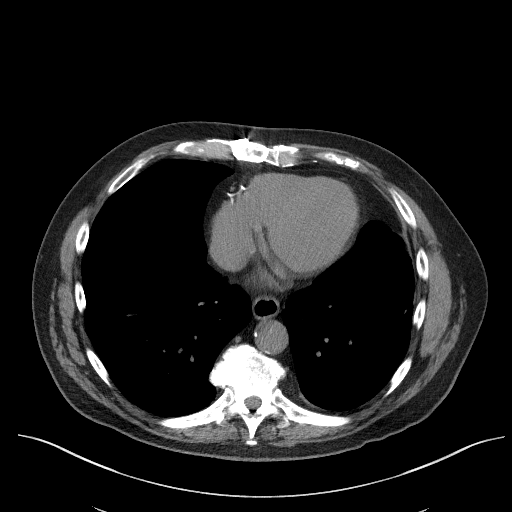

[Series 4: coronal st · coronal · 0.95mm/px · 3 of 98 slices shown]
[im 33/98  soft-tissue]
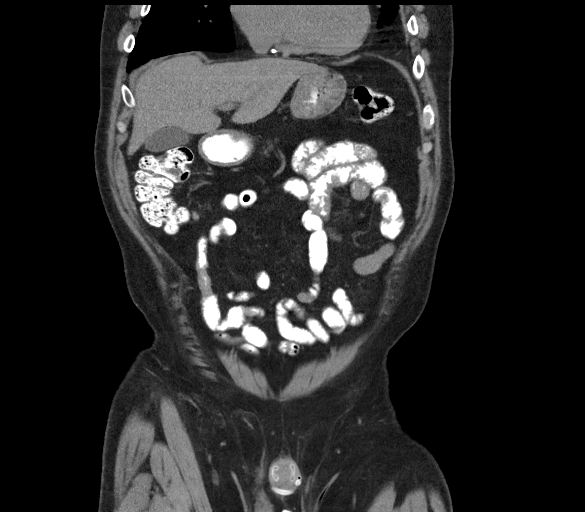
[im 44/98  soft-tissue]
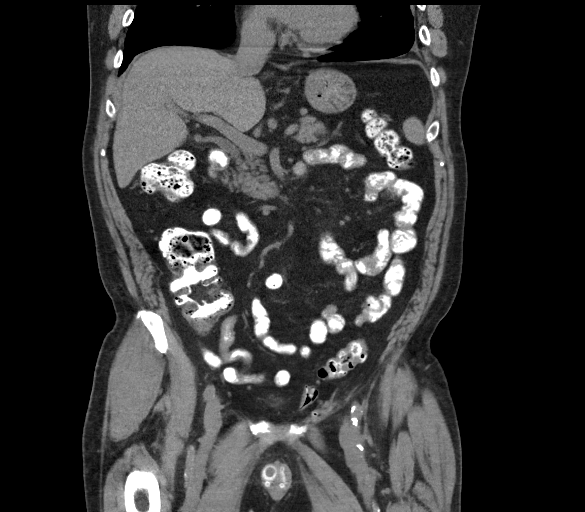
[im 54/98  soft-tissue]
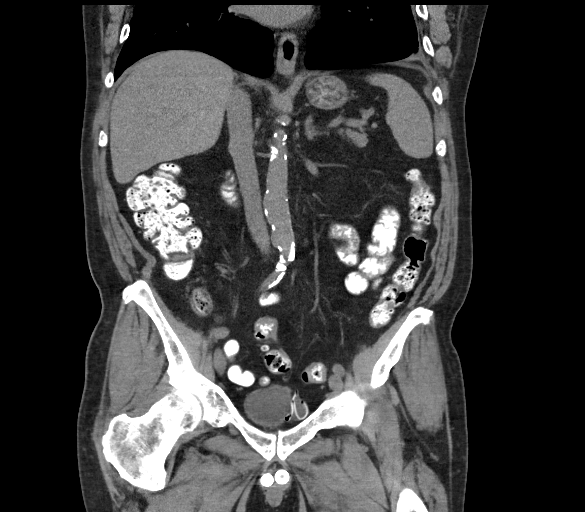

[17 of 46 positions shown; findings below may reference images not displayed]

FINDINGS: Lower chest: No acute findings.

Hepatobiliary: No mass visualized on this unenhanced exam.
Gallbladder is unremarkable.

Pancreas: No mass or inflammatory process visualized on this
unenhanced exam.

Spleen:  Within normal limits in size.

Adrenals/Urinary tract: No evidence of urolithiasis or
hydronephrosis. Unremarkable unopacified urinary bladder.

Stomach/Bowel: No evidence of obstruction, inflammatory process, or
abnormal fluid collections.

Vascular/Lymphatic: No pathologically enlarged lymph nodes
identified. No evidence of abdominal aortic aneurysm. Aortic
atherosclerosis.

Reproductive:  No mass or other significant abnormality.

Other: Penile prosthesis, with deflated reservoir seen in the left
suprapubic region.

Musculoskeletal:  No suspicious bone lesions identified.
IMPRESSION: No evidence of urolithiasis, hydronephrosis, or other acute
findings.

## 2019-09-07 ENCOUNTER — Ambulatory Visit: Payer: Medicare Other | Admitting: Gastroenterology

## 2019-09-23 ENCOUNTER — Other Ambulatory Visit: Payer: Self-pay | Admitting: Cardiovascular Disease

## 2019-10-02 ENCOUNTER — Other Ambulatory Visit: Payer: Self-pay | Admitting: Gastroenterology

## 2019-10-14 ENCOUNTER — Other Ambulatory Visit: Payer: Self-pay | Admitting: Internal Medicine

## 2019-10-14 DIAGNOSIS — E038 Other specified hypothyroidism: Secondary | ICD-10-CM

## 2019-10-23 ENCOUNTER — Telehealth: Payer: Self-pay | Admitting: Internal Medicine

## 2019-10-23 NOTE — Telephone Encounter (Signed)
Pt is calling in for a refill on his Rx Oxycodone -acetaminophen (PERCOCET) 10-325 MG  Pharm:  [facility] on [address].

## 2019-10-24 NOTE — Telephone Encounter (Signed)
Left message on machine for patient.  An appointment needed for more refills.

## 2019-10-26 ENCOUNTER — Telehealth: Payer: Self-pay | Admitting: Internal Medicine

## 2019-10-26 NOTE — Telephone Encounter (Signed)
Pt call and made a appt and stated he will be out of medication before his appt and want a refill on oxyCODONE-acetaminophen (PERCOCET) 10-325 MG tablet sent to  [place], [place] - [address] Phone:  [phone]  Fax:  [phone]

## 2019-10-28 NOTE — Telephone Encounter (Signed)
Ok to refill enough until his appointment.

## 2019-10-29 ENCOUNTER — Other Ambulatory Visit: Payer: Self-pay | Admitting: Internal Medicine

## 2019-10-29 ENCOUNTER — Other Ambulatory Visit: Payer: Self-pay | Admitting: Gastroenterology

## 2019-10-30 NOTE — Telephone Encounter (Signed)
See other phone note

## 2019-10-30 NOTE — Telephone Encounter (Signed)
Message sent back to PCP as per the pharmacist Nira Conn, the Rx has to be sent electronically.

## 2019-11-01 ENCOUNTER — Other Ambulatory Visit: Payer: Self-pay | Admitting: Internal Medicine

## 2019-11-01 DIAGNOSIS — G8929 Other chronic pain: Secondary | ICD-10-CM

## 2019-11-01 MED ORDER — OXYCODONE-ACETAMINOPHEN 10-325 MG PO TABS: 1.0000 | ORAL_TABLET | Freq: Two times a day (BID) | ORAL | 0 refills | Status: DC | PRN

## 2019-11-01 NOTE — Telephone Encounter (Signed)
Sent Rx for 1 month. Will need appointment for further refills.

## 2019-11-02 NOTE — Telephone Encounter (Signed)
Spoke with the pt and informed him of the message below.  Also reminded pt of previous appt scheduled for 10/21 to arrive at 9:45am.

## 2019-11-09 ENCOUNTER — Telehealth: Payer: Self-pay | Admitting: *Deleted

## 2019-11-09 ENCOUNTER — Ambulatory Visit (INDEPENDENT_AMBULATORY_CARE_PROVIDER_SITE_OTHER): Payer: Medicare Other

## 2019-11-09 ENCOUNTER — Ambulatory Visit: Payer: Medicare Other | Admitting: Gastroenterology

## 2019-11-09 ENCOUNTER — Other Ambulatory Visit (INDEPENDENT_AMBULATORY_CARE_PROVIDER_SITE_OTHER): Payer: Medicare Other

## 2019-11-09 ENCOUNTER — Encounter: Payer: Self-pay | Admitting: Internal Medicine

## 2019-11-09 ENCOUNTER — Other Ambulatory Visit: Payer: Self-pay

## 2019-11-09 ENCOUNTER — Ambulatory Visit (INDEPENDENT_AMBULATORY_CARE_PROVIDER_SITE_OTHER): Payer: Medicare Other | Admitting: Internal Medicine

## 2019-11-09 ENCOUNTER — Encounter: Payer: Self-pay | Admitting: Gastroenterology

## 2019-11-09 VITALS — BP 128/70 | HR 60 | Ht 69.0 in | Wt 181.0 lb

## 2019-11-09 VITALS — BP 110/80 | HR 61 | Temp 98.4°F | Wt 180.8 lb

## 2019-11-09 DIAGNOSIS — R0609 Other forms of dyspnea: Secondary | ICD-10-CM

## 2019-11-09 DIAGNOSIS — I5033 Acute on chronic diastolic (congestive) heart failure: Secondary | ICD-10-CM

## 2019-11-09 DIAGNOSIS — Z9889 Other specified postprocedural states: Secondary | ICD-10-CM

## 2019-11-09 DIAGNOSIS — Z9049 Acquired absence of other specified parts of digestive tract: Secondary | ICD-10-CM | POA: Diagnosis not present

## 2019-11-09 DIAGNOSIS — Z23 Encounter for immunization: Secondary | ICD-10-CM | POA: Diagnosis not present

## 2019-11-09 DIAGNOSIS — R601 Generalized edema: Secondary | ICD-10-CM | POA: Diagnosis not present

## 2019-11-09 DIAGNOSIS — K831 Obstruction of bile duct: Secondary | ICD-10-CM | POA: Diagnosis not present

## 2019-11-09 DIAGNOSIS — J90 Pleural effusion, not elsewhere classified: Secondary | ICD-10-CM | POA: Diagnosis not present

## 2019-11-09 DIAGNOSIS — R600 Localized edema: Secondary | ICD-10-CM

## 2019-11-09 DIAGNOSIS — R0600 Dyspnea, unspecified: Secondary | ICD-10-CM

## 2019-11-09 DIAGNOSIS — R109 Unspecified abdominal pain: Secondary | ICD-10-CM | POA: Diagnosis not present

## 2019-11-09 DIAGNOSIS — E119 Type 2 diabetes mellitus without complications: Secondary | ICD-10-CM | POA: Diagnosis not present

## 2019-11-09 DIAGNOSIS — R0601 Orthopnea: Secondary | ICD-10-CM

## 2019-11-09 DIAGNOSIS — K589 Irritable bowel syndrome without diarrhea: Secondary | ICD-10-CM

## 2019-11-09 DIAGNOSIS — R899 Unspecified abnormal finding in specimens from other organs, systems and tissues: Secondary | ICD-10-CM | POA: Diagnosis not present

## 2019-11-09 LAB — HEPATIC FUNCTION PANEL
ALT: 8 U/L (ref 0–53)
AST: 18 U/L (ref 0–37)
Albumin: 3.7 g/dL (ref 3.5–5.2)
Alkaline Phosphatase: 70 U/L (ref 39–117)
Bilirubin, Direct: 0.1 mg/dL (ref 0.0–0.3)
Total Bilirubin: 0.4 mg/dL (ref 0.2–1.2)
Total Protein: 6.9 g/dL (ref 6.0–8.3)

## 2019-11-09 LAB — COMPREHENSIVE METABOLIC PANEL
AG Ratio: 1.3 (calc) (ref 1.0–2.5)
ALT: 7 U/L — ABNORMAL LOW (ref 9–46)
AST: 17 U/L (ref 10–35)
Albumin: 3.7 g/dL (ref 3.6–5.1)
Alkaline phosphatase (APISO): 69 U/L (ref 35–144)
BUN/Creatinine Ratio: 16 (calc) (ref 6–22)
BUN: 25 mg/dL (ref 7–25)
CO2: 30 mmol/L (ref 20–32)
Calcium: 8.8 mg/dL (ref 8.6–10.3)
Chloride: 108 mmol/L (ref 98–110)
Creat: 1.61 mg/dL — ABNORMAL HIGH (ref 0.70–1.18)
Globulin: 2.9 g/dL (calc) (ref 1.9–3.7)
Glucose, Bld: 134 mg/dL — ABNORMAL HIGH (ref 65–99)
Potassium: 4.5 mmol/L (ref 3.5–5.3)
Sodium: 143 mmol/L (ref 135–146)
Total Bilirubin: 0.4 mg/dL (ref 0.2–1.2)
Total Protein: 6.6 g/dL (ref 6.1–8.1)

## 2019-11-09 LAB — POCT GLYCOSYLATED HEMOGLOBIN (HGB A1C): Hemoglobin A1C: 6.3 % — AB (ref 4.0–5.6)

## 2019-11-09 LAB — CBC WITH DIFFERENTIAL/PLATELET
Absolute Monocytes: 670 cells/uL (ref 200–950)
Basophils Absolute: 50 cells/uL (ref 0–200)
Basophils Relative: 0.7 %
Eosinophils Absolute: 259 cells/uL (ref 15–500)
Eosinophils Relative: 3.6 %
HCT: 31 % — ABNORMAL LOW (ref 38.5–50.0)
Hemoglobin: 9.7 g/dL — ABNORMAL LOW (ref 13.2–17.1)
Lymphs Abs: 1750 cells/uL (ref 850–3900)
MCH: 25.9 pg — ABNORMAL LOW (ref 27.0–33.0)
MCHC: 31.3 g/dL — ABNORMAL LOW (ref 32.0–36.0)
MCV: 82.9 fL (ref 80.0–100.0)
MPV: 10 fL (ref 7.5–12.5)
Monocytes Relative: 9.3 %
Neutro Abs: 4471 cells/uL (ref 1500–7800)
Neutrophils Relative %: 62.1 %
Platelets: 172 10*3/uL (ref 140–400)
RBC: 3.74 10*6/uL — ABNORMAL LOW (ref 4.20–5.80)
RDW: 14.5 % (ref 11.0–15.0)
Total Lymphocyte: 24.3 %
WBC: 7.2 10*3/uL (ref 3.8–10.8)

## 2019-11-09 MED ORDER — FUROSEMIDE 40 MG PO TABS: 40.0000 mg | ORAL_TABLET | Freq: Every day | ORAL | 1 refills | Status: DC

## 2019-11-09 MED ORDER — FUROSEMIDE 40 MG PO TABS: 40.0000 mg | ORAL_TABLET | Freq: Two times a day (BID) | ORAL | 1 refills | Status: DC

## 2019-11-09 NOTE — Progress Notes (Signed)
Established Patient Office Visit     This visit occurred during the SARS-CoV-2 public health emergency.  Safety protocols were in place, including screening questions prior to the visit, additional usage of staff PPE, and extensive cleaning of exam room while observing appropriate contact time as indicated for disinfecting solutions.    CC/Reason for Visit: "I feel like I am dying"  HPI: [name] is a 79 y.o. male who is coming in today for the above mentioned reasons.  He is coming in today complaining of progressive dyspnea on exertion to the point where "even if up and down to pick up a stick I am short of breath".  I cannot even walk from my house to my car without having to stop 3 times".  He denies any ongoing chest pain.  He feels like his legs are significantly swollen.  He feels that when he goes to sleep at night that someone is "smothering him with a pillow", he has to wake up and sit at the side of the bed and this will gradually improve.  He has chronic pain syndrome and is treated with oxycodone.  He also has a lot of somatic pain complaints.  His cardiac history is extensive.  I will insert this below:  known CAD and underwent CABG revascularization surgery in 2001 with a LIMA to his LAD, vein to the marginal, vein to the RCA. Catheterization in 2004 showed patent grafts. An echo Doppler study in August 2012 revealed an ejection fraction of 40-45% with moderate septal hypokinesis with grade 1 diastolic dysfunction as well as aortic valve sclerosis.He undewent a five-year follow-up nuclear perfusion study in November 2016.  Ejection fraction was 54%.  He had normal perfusion. December 23, 2017 an echo Doppler study showed an EF of 60 to 65% with grade 1 diastolic dysfunction and mild aortic valve stenosis.  A Lexiscan Myoview study on January 04, 2018 was low risk and demonstrated normal perfusion without scar or ischemia.  EF was 51%.  In the spring 2020 he had abdominal  discomfort, and ended up having cholangitis and E. coli sepsis.  During the hospitalization for his cholecystectomy he ended up having an NSTEMI.  He had a repeat cardiac catheterization in December 2020 which showed 99% subtotally occluded SVG-RPDA/RPL. This was treated with 3 overlapping resolute Onyx drug-eluting stents. Postprocedure, it was recommended to continue aspirin and Plavix indefinitelyand if the patient subsequently developedrefractory symptomsthere was consideration forPCI of ostial left main, left circumflex and OM1. Echocardiogram obtained on the same day showed EF 40 to 45%, severe hypokinesis of the entire inferior and inferoseptal wall, grade 1 DD, mild MR, cannot exclude ASD/PFO.   Past Medical/Surgical History: Past Medical History:  Diagnosis Date  . Allergy   . B12 deficiency anemia   . Blood transfusion without reported diagnosis   . CAD (coronary artery disease)   . Cancer ([place])    bladder-   . Colon polyps   . COPD (chronic obstructive pulmonary disease) ([place])   . Depression   . Esophagus, Barrett's   . GERD (gastroesophageal reflux disease)   . History of bladder cancer    Bladder cancer "8 times"  . History of hiatal hernia   . Hyperlipidemia   . Hypertension   . Localized osteoarthrosis, lower leg   . Restless leg syndrome   . Sleep apnea    does not wear cpap  . Stenosis of esophagus     Past Surgical History:  Procedure  Laterality Date  . BILIARY BRUSHING  04/01/2018   Procedure: BILIARY BRUSHING;  Surgeon: Rush Landmark Telford Nab., MD;  Location: [place];  Service: Gastroenterology;;  . BILIARY BRUSHING  09/12/2018   Procedure: BILIARY BRUSHING;  Surgeon: Irving Copas., MD;  Location: [place];  Service: Gastroenterology;;  . BILIARY BRUSHING  11/28/2018   Procedure: BILIARY BRUSHING;  Surgeon: Irving Copas., MD;  Location: [place];  Service: Gastroenterology;;  . BILIARY DILATION  09/12/2018   Procedure:  BILIARY DILATION;  Surgeon: Irving Copas., MD;  Location: [place];  Service: Gastroenterology;;  . BILIARY DILATION  11/28/2018   Procedure: BILIARY DILATION;  Surgeon: Irving Copas., MD;  Location: [place];  Service: Gastroenterology;;  . BILIARY STENT PLACEMENT  04/01/2018   Procedure: BILIARY STENT PLACEMENT;  Surgeon: Irving Copas., MD;  Location: [place];  Service: Gastroenterology;;  . BILIARY STENT PLACEMENT  09/12/2018   Procedure: BILIARY STENT PLACEMENT;  Surgeon: Irving Copas., MD;  Location: [place];  Service: Gastroenterology;;  . BILIARY STENT PLACEMENT  11/28/2018   Procedure: BILIARY STENT PLACEMENT;  Surgeon: Irving Copas., MD;  Location: [place];  Service: Gastroenterology;;  . BIOPSY  04/01/2018   Procedure: BIOPSY;  Surgeon: Irving Copas., MD;  Location: [place];  Service: Gastroenterology;;  . BIOPSY  09/12/2018   Procedure: BIOPSY;  Surgeon: Irving Copas., MD;  Location: [place];  Service: Gastroenterology;;  . BIOPSY  11/28/2018   Procedure: BIOPSY;  Surgeon: Irving Copas., MD;  Location: [facility] ENDOSCOPY;  Service: Gastroenterology;;  . bladder cancer      x 8 cystoscopy  . CERVICAL DISCECTOMY     ACDF  . COLONOSCOPY  11/17/2005   normal   . CORONARY ARTERY BYPASS GRAFT     x4  . CORONARY STENT INTERVENTION N/A 01/05/2019   Procedure: CORONARY STENT INTERVENTION;  Surgeon: Nelva Bush, MD;  Location: [place] [facility];  Service: Cardiovascular;  Laterality: N/A;  . ENDOSCOPIC MUCOSAL RESECTION  09/12/2018   Procedure: ENDOSCOPIC MUCOSAL RESECTION;  Surgeon: Rush Landmark, Telford Nab., MD;  Location: [facility] ENDOSCOPY;  Service: Gastroenterology;;  . ENDOSCOPIC RETROGRADE CHOLANGIOPANCREATOGRAPHY (ERCP) WITH PROPOFOL N/A 04/01/2018   Procedure: ENDOSCOPIC RETROGRADE CHOLANGIOPANCREATOGRAPHY (ERCP) WITH PROPOFOL;  Surgeon: Irving Copas., MD;  Location: [place];  Service: Gastroenterology;  Laterality: N/A;  . ENDOSCOPIC RETROGRADE CHOLANGIOPANCREATOGRAPHY (ERCP) WITH PROPOFOL N/A 09/12/2018   Procedure: ENDOSCOPIC RETROGRADE CHOLANGIOPANCREATOGRAPHY (ERCP) WITH PROPOFOL;  Surgeon: Rush Landmark Telford Nab., MD;  Location: [place];  Service: Gastroenterology;  Laterality: N/A;  . ERCP N/A 11/28/2018   Procedure: ENDOSCOPIC RETROGRADE CHOLANGIOPANCREATOGRAPHY (ERCP) +EGD with spyglass;  Surgeon: Rush Landmark Telford Nab., MD;  Location: [place];  Service: Gastroenterology;  Laterality: N/A;  . ESOPHAGOGASTRODUODENOSCOPY  04/29/2010  . ESOPHAGOGASTRODUODENOSCOPY (EGD) WITH PROPOFOL N/A 04/01/2018   Procedure: ESOPHAGOGASTRODUODENOSCOPY (EGD) WITH PROPOFOL;  Surgeon: Rush Landmark Telford Nab., MD;  Location: [place];  Service: Gastroenterology;  Laterality: N/A;  . ESOPHAGOGASTRODUODENOSCOPY (EGD) WITH PROPOFOL N/A 09/12/2018   Procedure: ESOPHAGOGASTRODUODENOSCOPY (EGD) WITH PROPOFOL;  Surgeon: Rush Landmark Telford Nab., MD;  Location: [place];  Service: Gastroenterology;  Laterality: N/A;  . ESOPHAGOGASTRODUODENOSCOPY (EGD) WITH PROPOFOL N/A 11/28/2018   Procedure: ESOPHAGOGASTRODUODENOSCOPY (EGD) WITH PROPOFOL;  Surgeon: Rush Landmark Telford Nab., MD;  Location: [place];  Service: Gastroenterology;  Laterality: N/A;  . EUS  04/01/2018   Procedure: FULL UPPER ENDOSCOPIC ULTRASOUND (EUS) RADIAL;  Surgeon: Irving Copas., MD;  Location: [place];  Service: Gastroenterology;;  . EUS N/A 09/12/2018   Procedure: UPPER  ENDOSCOPIC ULTRASOUND (EUS) RADIAL;  Surgeon: Rush Landmark Telford Nab., MD;  Location: [place];  Service: Gastroenterology;  Laterality: N/A;  . FINE NEEDLE ASPIRATION  09/12/2018   Procedure: FINE NEEDLE ASPIRATION (FNA) LINEAR;  Surgeon: Irving Copas., MD;  Location: [place];  Service: Gastroenterology;;  . HEMOSTASIS CLIP PLACEMENT  09/12/2018   Procedure: HEMOSTASIS CLIP PLACEMENT;  Surgeon:  Irving Copas., MD;  Location: [facility] ENDOSCOPY;  Service: Gastroenterology;;  . I & D EXTREMITY Left 11/24/2016   Procedure: IRRIGATION AND DEBRIDEMENT LEFT HAND, THUMB, INDEX, MIDDLE, RING, AND SMALL FINGERS WITH RECONSTRUCTION;  Surgeon: Roseanne Kaufman, MD;  Location: [place];  Service: Orthopedics;  Laterality: Left;  . KNEE ARTHROSCOPY Left   . LEFT HEART CATH AND CORS/GRAFTS ANGIOGRAPHY N/A 01/05/2019   Procedure: LEFT HEART CATH AND CORS/GRAFTS ANGIOGRAPHY;  Surgeon: Nelva Bush, MD;  Location: [place] [facility];  Service: Cardiovascular;  Laterality: N/A;  . LUMBAR LAMINECTOMY     and fusion x 2  . NASAL SINUS SURGERY    . POPLITEAL SYNOVIAL CYST EXCISION    . REMOVAL OF STONES  04/01/2018   Procedure: REMOVAL OF STONES;  Surgeon: Rush Landmark Telford Nab., MD;  Location: [place];  Service: Gastroenterology;;  . REMOVAL OF STONES  09/12/2018   Procedure: REMOVAL OF STONES;  Surgeon: Irving Copas., MD;  Location: [place];  Service: Gastroenterology;;  . REMOVAL OF STONES  11/28/2018   Procedure: REMOVAL OF STONES;  Surgeon: Irving Copas., MD;  Location: [place];  Service: Gastroenterology;;  . Azzie Almas DILATION N/A 09/12/2018   Procedure: Azzie Almas DILATION;  Surgeon: Irving Copas., MD;  Location: [place];  Service: Gastroenterology;  Laterality: N/A;  . SAVORY DILATION N/A 11/28/2018   Procedure: SAVORY DILATION;  Surgeon: Rush Landmark Telford Nab., MD;  Location: [place];  Service: Gastroenterology;  Laterality: N/A;  . SPHINCTEROTOMY  04/01/2018   Procedure: SPHINCTEROTOMY;  Surgeon: Rush Landmark Telford Nab., MD;  Location: [place];  Service: Gastroenterology;;  . Bess Kinds CHOLANGIOSCOPY N/A 11/28/2018   Procedure: SFKCLEXN CHOLANGIOSCOPY;  Surgeon: Irving Copas., MD;  Location: [place];  Service: Gastroenterology;  Laterality: N/A;  . STENT REMOVAL  09/12/2018   Procedure: STENT REMOVAL;  Surgeon: Irving Copas., MD;  Location: [place];  Service: Gastroenterology;;  . Lavell Islam REMOVAL  11/28/2018   Procedure: STENT REMOVAL;  Surgeon: Irving Copas., MD;  Location: [place];  Service: Gastroenterology;;  . Lia Foyer LIFTING INJECTION  09/12/2018   Procedure: SUBMUCOSAL LIFTING INJECTION;  Surgeon: Irving Copas., MD;  Location: [place];  Service: Gastroenterology;;    Social History:  reports that he quit smoking about 43 years ago. His smoking use included cigarettes. He has a 2.50 pack-year smoking history. He has never used smokeless tobacco. He reports that he does not drink alcohol and does not use drugs.  Allergies: Allergies  Allergen Reactions  . Losartan Potassium Other (See Comments)    Hyperkalemia  . Tape Itching and Rash    reddened skin     Family History:  Family History  Problem Relation Age of Onset  . Melanoma Mother   . Stroke Father   . Hypertension Father   . Coronary artery disease Other   . Colon cancer Neg Hx   . Esophageal cancer Neg Hx   . Stomach cancer Neg Hx   . Rectal cancer Neg Hx   . Pancreatic cancer Neg Hx   . Liver disease Neg Hx   . Inflammatory bowel disease Neg Hx  Current Outpatient Medications:  .  acetaminophen (TYLENOL) 325 MG tablet, Take 650 mg by mouth every 6 (six) hours as needed for mild pain., Disp: , Rfl:  .  amLODipine (NORVASC) 2.5 MG tablet, TAKE 2 TABLETS BY MOUTH DAILY, Disp: 90 tablet, Rfl: 8 .  aspirin EC 81 MG tablet, Take 81 mg by mouth at bedtime., Disp: , Rfl:  .  BYSTOLIC 10 MG tablet, Take 1 tablet by mouth once daily, Disp: 90 tablet, Rfl: 3 .  clopidogrel (PLAVIX) 75 MG tablet, Take 1 tablet (75 mg total) by mouth daily., Disp: 90 tablet, Rfl: 3 .  cyanocobalamin (,VITAMIN B-12,) 1000 MCG/ML injection, INJECT 1ML INTO THE MUSCLE FOR 1 DOSE, Disp: 6 mL, Rfl: 1 .  dicyclomine (BENTYL) 10 MG capsule, Take 1 capsule (10 mg total) by mouth 4 (four) times daily -  before meals and  at bedtime., Disp: 90 capsule, Rfl: 2 .  doxycycline (VIBRAMYCIN) 100 MG capsule, Take 1 capsule (100 mg total) by mouth 2 (two) times daily., Disp: 20 capsule, Rfl: 0 .  ezetimibe (ZETIA) 10 MG tablet, Take 1 tablet (10 mg total) by mouth daily., Disp: 90 tablet, Rfl: 1 .  Fiber Complete TABS, Take 1 each by mouth daily. , Disp: , Rfl:  .  fluticasone (FLONASE) 50 MCG/ACT nasal spray, Place 1-2 sprays into both nostrils at bedtime as needed for allergies or rhinitis., Disp: , Rfl:  .  isosorbide mononitrate (IMDUR) 60 MG 24 hr tablet, Take 1 tablet by mouth once daily, Disp: 90 tablet, Rfl: 3 .  loratadine (CLARITIN) 10 MG tablet, Take 10 mg by mouth daily., Disp: , Rfl:  .  Menthol, Topical Analgesic, (BENGAY EX), Apply 1 application topically daily as needed (pain)., Disp: , Rfl:  .  Multiple Vitamins-Minerals (MULTIVITAMIN WITH MINERALS) tablet, Take 1 tablet by mouth daily., Disp: , Rfl:  .  Naphazoline-Pheniramine (OPCON-A) 0.027-0.315 % SOLN, Place 1 drop into both eyes daily as needed (itching eyes)., Disp: , Rfl:  .  NEEDLE, DISP, 25 G (B-D DISP NEEDLE 25GX1") 25G X 1" MISC, Inject 1000 mcg into muscle once a month., Disp: 50 each, Rfl: 0 .  nitroGLYCERIN (NITROSTAT) 0.4 MG SL tablet, Place 1 tablet (0.4 mg total) under the tongue every 5 (five) minutes as needed for chest pain., Disp: 30 tablet, Rfl: 3 .  omeprazole (PRILOSEC) 40 MG capsule, Take 1 capsule by mouth once daily, Disp: 30 capsule, Rfl: 2 .  oxyCODONE-acetaminophen (PERCOCET) 10-325 MG tablet, Take 1 tablet by mouth 2 (two) times daily as needed for pain., Disp: 60 tablet, Rfl: 0 .  Probiotic Product (PROBIOTIC ADVANCED PO), Take 1 tablet by mouth 2 (two) times daily. , Disp: , Rfl:  .  rOPINIRole (REQUIP) 2 MG tablet, TAKE 1 TABLET BY MOUTH AT BEDTIME, Disp: 90 tablet, Rfl: 0 .  rosuvastatin (CRESTOR) 10 MG tablet, Take 1 tablet (10 mg total) by mouth daily., Disp: 90 tablet, Rfl: 3 .  saccharomyces boulardii (FLORASTOR) 250  MG capsule, Take 1 capsule (250 mg total) by mouth 2 (two) times daily., Disp: 60 capsule, Rfl: 2 .  venlafaxine (EFFEXOR) 75 MG tablet, Take 1 tablet by mouth once daily, Disp: 90 tablet, Rfl: 0 .  furosemide (LASIX) 40 MG tablet, Take 1 tablet (40 mg total) by mouth 2 (two) times daily., Disp: 90 tablet, Rfl: 1  Review of Systems:  Constitutional: Denies fever, chills, diaphoresis, appetite change. HEENT: Denies photophobia, eye pain, redness, hearing loss, ear pain, congestion, sore throat, rhinorrhea,  sneezing, mouth sores, trouble swallowing, neck pain, neck stiffness and tinnitus.   Respiratory: Denies SOB, DOE, cough, chest tightness,  and wheezing.   Cardiovascular: Denies chest pain, palpitations and leg swelling.  Gastrointestinal: Denies nausea, vomiting, abdominal pain, diarrhea, constipation, blood in stool and abdominal distention.  Genitourinary: Denies dysuria, urgency, frequency, hematuria, flank pain and difficulty urinating.  Endocrine: Denies: hot or cold intolerance, sweats, changes in hair or nails, polyuria, polydipsia. Musculoskeletal: Denies myalgias, back pain, joint swelling, arthralgias and gait problem.  Skin: Denies pallor, rash and wound.  Neurological: Denies dizziness, seizures, syncope, weakness, light-headedness, numbness and headaches.  Hematological: Denies adenopathy. Easy bruising, personal or family bleeding history  Psychiatric/Behavioral: Denies suicidal ideation, mood changes, confusion, nervousness, sleep disturbance and agitation    Physical Exam: Vitals:   11/09/19 0852  BP: 110/80  Pulse: 61  Temp: 98.4 F (36.9 C)  TempSrc: Oral  SpO2: 95%  Weight: 180 lb 12.8 oz (82 kg)    Body mass index is 26.7 kg/m.   Constitutional: NAD, calm, comfortable Eyes: PERRL, lids and conjunctivae normal ENMT: Mucous membranes are moist.  Respiratory: Bilateral crackles, more prominent over the lung bases. Cardiovascular: Regular rate and rhythm, no  murmurs / rubs / gallops.  2+ pitting lower extremity edema up to the knees bilaterally. 2+ pedal pulses.  Neurologic: Grossly intact and nonfocal Psychiatric: Normal judgment and insight. Alert and oriented x 3. Normal mood.    Impression and Plan:  Acute on chronic diastolic (congestive) heart failure (HCC) Bilateral lower extremity edema Orthopnea DOE (dyspnea on exertion)   -Last echo from March 2021 shows an ejection fraction of 50 to 55% with grade 2 diastolic dysfunction. -He does not appear to be on diuretic therapy. -I will start him on Lasix 40 mg twice daily to start today. -He is on Bystolic, Imdur, aspirin, Plavix, statin, no ACE inhibitor/ARB due to renal dysfunction.  Not sure that amlodipine is a good medication for him given his ongoing lower extremity edema. -Chest x-ray to be done in office as well as lab work today. -EKG done in office and interpreted by myself shows sinus bradycardia at a rate of 58, however no acute ST or T wave changes. -Case has been discussed with on-call cardiologist, Dr. Skeet Latch.  She agrees with management plan and will arrange for follow-up in office early next week. -Patient knows to go to the emergency department over the weekend if his symptoms were to progress.  Need for influenza vaccination -Flu vaccine administered today.  Diabetes mellitus type 2, noninsulin dependent ([place])  -Well-controlled with an A1c of 6.3 today.   Time spent: 48 minutes. Greater than 50% of this time was spent in direct contact with the patient, coordinating care and discussing relevant ongoing clinical issues, including concerns for acute diastolic heart failure, plan to do labs and x-ray in office today, plan to start twice daily diuretic therapy, as well as consulting with on-call cardiologist.   Patient Instructions  -Nice seeing you today!!  -Lab work today; will notify you once results are available.  -Chest xray today.  -Start lasix  (furosemide) 40 mg daily.  -Stop taking HCTZ weekly.  -Flu vaccine today.  -Schedule follow up in 2 weeks.     [name], MD [facility] at [facility]

## 2019-11-09 NOTE — Addendum Note (Signed)
Addended by: Westley Hummer B on: 11/09/2019 04:17 PM   Modules accepted: Orders

## 2019-11-09 NOTE — Patient Instructions (Addendum)
Your provider has requested that you go to the basement level for lab work before leaving today. Press "B" on the elevator. The lab is located at the first door on the left as you exit the elevator.  It has been recommended to you by your physician that you have a(n) ERCP in Jan 2022 . We did not schedule the procedure(s) today. We will contact you at later time to schedule.   If you are age 77 or older, your body mass index should be between 23-30. Your Body mass index is 26.73 kg/m. If this is out of the aforementioned range listed, please consider follow up with your Primary Care Provider.  If you are age 80 or younger, your body mass index should be between 19-25. Your Body mass index is 26.73 kg/m. If this is out of the aformentioned range listed, please consider follow up with your Primary Care Provider.    Due to recent changes in healthcare laws, you may see the results of your imaging and laboratory studies on MyChart before your provider has had a chance to review them.  We understand that in some cases there may be results that are confusing or concerning to you. Not all laboratory results come back in the same time frame and the provider may be waiting for multiple results in order to interpret others.  Please give [place] 48 hours in order for your provider to thoroughly review all the results before contacting the office for clarification of your results.   Thank you for choosing me and [facility].  Dr. Rush Landmark

## 2019-11-09 NOTE — Telephone Encounter (Signed)
Dr Oval Linsey DOD spoke with Dr Alden Hipp regarding today's visit Per Dr Oval Linsey patient needs follow up Monday and go to ED if worsening symptoms Called and spoke with patient, scheduled visit for Monday with Denyse Amass. Patient will call back if unable to come, not at home

## 2019-11-09 NOTE — Patient Instructions (Addendum)
-  Nice seeing you today!!  -Lab work today; will notify you once results are available.  -Chest xray today.  -Start lasix (furosemide) 40 mg twice daily.  -Stop taking HCTZ weekly.  -Flu vaccine today.  -Schedule follow up in 2 weeks.

## 2019-11-09 NOTE — Progress Notes (Signed)
[facility] VISIT   Primary Care Provider Isaac Bliss, Rayford Halsted, MD [place] [address] [phone]  Patient Profile: [name] is a 79 y.o. male with a pmh significant for CAD (status post PCI in 12/20), COPD, MDD, hypertension, hyperlipidemia, bladder cancer, sleep apnea, restless leg syndrome, Barrett's esophagus without dysplasia, chronic abdominal pain, prior pancreatitis secondary to gallstone disease, s/p CCK, distal biliary stricture NOS (with stents in place), elevated IgG4.  The patient presents to the [facility] for an evaluation and management of problem(s) noted below:  Problem List 1. Biliary stricture   2. Elevated IgG4   3. Functional abdominal pain syndrome   4. Lower extremity edema   5. History of ERCP     History of Present Illness Please see initial consultation note and my prior progress notes for full details of HPI.   Interval History The patient returns, not accompanied by his daughter, for follow-up.  I have not seen him in the last 10 months.  His last ERCP was in November 2020.  He has 3 biliary stent still present.  We had held off repeat ERCP due to his issues of need for continued Plavix use (he will be 1 year post intervention in December).  The patient has had a stable weight.  He continues to have lower abdominal discomforts as he has had for years.  Bowel habits change at times.  No blood in his stools.  Continues on his Bentyl.  He feels that his prior right upper quadrant and upper abdominal discomfort has continued to be okay since his gallbladder resection.  He denies any overt dysphagia currently.  What has been a bigger problem for the patient recently is that over the course of the last few months he feels he is having increasing shortness of breath as well as increasing lower extremity edema.  He is going to be seeing cardiology in the coming week.  GI Review of  Systems Positive as above including still having bloating Negative for odynophagia, melena, hematochezia    Review of Systems General: Denies fevers/chills/weight loss unintentionally Cardiovascular: Denies recurrent chest pain Pulmonary: Increased shortness of breath and dyspnea on exertion over last few months Gastroenterological: See HPI Genitourinary: Denies darkened urine Hematological: Positive for easy bruising/bleeding due to Plavix Dermatological: Denies jaundice Psychological: Mood is stable though he wonders if he will ever be pain-free   Medications Current Outpatient Medications  Medication Sig Dispense Refill  . acetaminophen (TYLENOL) 325 MG tablet Take 650 mg by mouth every 6 (six) hours as needed for mild pain.    [name] amLODipine (NORVASC) 2.5 MG tablet TAKE 2 TABLETS BY MOUTH DAILY 90 tablet 8  . aspirin EC 81 MG tablet Take 81 mg by mouth at bedtime.    [name] BYSTOLIC 10 MG tablet Take 1 tablet by mouth once daily 90 tablet 3  . clopidogrel (PLAVIX) 75 MG tablet Take 1 tablet (75 mg total) by mouth daily. 90 tablet 3  . cyanocobalamin (,VITAMIN B-12,) 1000 MCG/ML injection INJECT 1ML INTO THE MUSCLE FOR 1 DOSE 6 mL 1  . dicyclomine (BENTYL) 10 MG capsule Take 1 capsule (10 mg total) by mouth 4 (four) times daily -  before meals and at bedtime. 90 capsule 2  . doxycycline (VIBRAMYCIN) 100 MG capsule Take 1 capsule (100 mg total) by mouth 2 (two) times daily. 20 capsule 0  . ezetimibe (ZETIA) 10 MG tablet Take 1 tablet (10 mg total) by mouth  daily. 90 tablet 1  . Fiber Complete TABS Take 1 each by mouth daily.     . fluticasone (FLONASE) 50 MCG/ACT nasal spray Place 1-2 sprays into both nostrils at bedtime as needed for allergies or rhinitis.    . furosemide (LASIX) 40 MG tablet Take 1 tablet (40 mg total) by mouth 2 (two) times daily. 90 tablet 1  . isosorbide mononitrate (IMDUR) 60 MG 24 hr tablet Take 1 tablet by mouth once daily 90 tablet 3  . loratadine (CLARITIN) 10 MG  tablet Take 10 mg by mouth daily.    . Menthol, Topical Analgesic, (BENGAY EX) Apply 1 application topically daily as needed (pain).    . Multiple Vitamins-Minerals (MULTIVITAMIN WITH MINERALS) tablet Take 1 tablet by mouth daily.    . Naphazoline-Pheniramine (OPCON-A) 0.027-0.315 % SOLN Place 1 drop into both eyes daily as needed (itching eyes).    . NEEDLE, DISP, 25 G (B-D DISP NEEDLE 25GX1") 25G X 1" MISC Inject 1000 mcg into muscle once a month. 50 each 0  . nitroGLYCERIN (NITROSTAT) 0.4 MG SL tablet Place 1 tablet (0.4 mg total) under the tongue every 5 (five) minutes as needed for chest pain. 30 tablet 3  . omeprazole (PRILOSEC) 40 MG capsule Take 1 capsule by mouth once daily (Patient taking differently: 20 mg. ) 30 capsule 2  . oxyCODONE-acetaminophen (PERCOCET) 10-325 MG tablet Take 1 tablet by mouth 2 (two) times daily as needed for pain. 60 tablet 0  . Probiotic Product (PROBIOTIC ADVANCED PO) Take 1 tablet by mouth 2 (two) times daily.     [name] rOPINIRole (REQUIP) 2 MG tablet TAKE 1 TABLET BY MOUTH AT BEDTIME 90 tablet 0  . rosuvastatin (CRESTOR) 10 MG tablet Take 1 tablet (10 mg total) by mouth daily. 90 tablet 3  . saccharomyces boulardii (FLORASTOR) 250 MG capsule Take 1 capsule (250 mg total) by mouth 2 (two) times daily. 60 capsule 2  . venlafaxine (EFFEXOR) 75 MG tablet Take 1 tablet by mouth once daily 90 tablet 0   No current facility-administered medications for this visit.    Allergies Allergies  Allergen Reactions  . Losartan Potassium Other (See Comments)    Hyperkalemia  . Tape Itching and Rash    reddened skin     Histories Past Medical History:  Diagnosis Date  . Allergy   . B12 deficiency anemia   . Blood transfusion without reported diagnosis   . CAD (coronary artery disease)   . Cancer ([place])    bladder-   . Colon polyps   . COPD (chronic obstructive pulmonary disease) ([place])   . Depression   . Esophagus, Barrett's   . GERD (gastroesophageal reflux  disease)   . History of bladder cancer    Bladder cancer "8 times"  . History of hiatal hernia   . Hyperlipidemia   . Hypertension   . Localized osteoarthrosis, lower leg   . Restless leg syndrome   . Sleep apnea    does not wear cpap  . Stenosis of esophagus    Past Surgical History:  Procedure Laterality Date  . BILIARY BRUSHING  04/01/2018   Procedure: BILIARY BRUSHING;  Surgeon: Rush Landmark Telford Nab., MD;  Location: [place];  Service: Gastroenterology;;  . BILIARY BRUSHING  09/12/2018   Procedure: BILIARY BRUSHING;  Surgeon: Irving Copas., MD;  Location: [place];  Service: Gastroenterology;;  . BILIARY BRUSHING  11/28/2018   Procedure: BILIARY BRUSHING;  Surgeon: Irving Copas., MD;  Location: [place];  Service: Gastroenterology;;  . BILIARY DILATION  09/12/2018   Procedure: BILIARY DILATION;  Surgeon: Irving Copas., MD;  Location: [place];  Service: Gastroenterology;;  . BILIARY DILATION  11/28/2018   Procedure: BILIARY DILATION;  Surgeon: Irving Copas., MD;  Location: [place];  Service: Gastroenterology;;  . BILIARY STENT PLACEMENT  04/01/2018   Procedure: BILIARY STENT PLACEMENT;  Surgeon: Irving Copas., MD;  Location: [place];  Service: Gastroenterology;;  . BILIARY STENT PLACEMENT  09/12/2018   Procedure: BILIARY STENT PLACEMENT;  Surgeon: Irving Copas., MD;  Location: [place];  Service: Gastroenterology;;  . BILIARY STENT PLACEMENT  11/28/2018   Procedure: BILIARY STENT PLACEMENT;  Surgeon: Irving Copas., MD;  Location: [place];  Service: Gastroenterology;;  . BIOPSY  04/01/2018   Procedure: BIOPSY;  Surgeon: Irving Copas., MD;  Location: [place];  Service: Gastroenterology;;  . BIOPSY  09/12/2018   Procedure: BIOPSY;  Surgeon: Irving Copas., MD;  Location: [place];  Service: Gastroenterology;;  . BIOPSY  11/28/2018   Procedure: BIOPSY;   Surgeon: Irving Copas., MD;  Location: [place];  Service: Gastroenterology;;  . bladder cancer      x 8 cystoscopy  . CERVICAL DISCECTOMY     ACDF  . COLONOSCOPY  11/17/2005   normal   . CORONARY ARTERY BYPASS GRAFT     x4  . CORONARY STENT INTERVENTION N/A 01/05/2019   Procedure: CORONARY STENT INTERVENTION;  Surgeon: Nelva Bush, MD;  Location: [place] [facility];  Service: Cardiovascular;  Laterality: N/A;  . ENDOSCOPIC MUCOSAL RESECTION  09/12/2018   Procedure: ENDOSCOPIC MUCOSAL RESECTION;  Surgeon: Rush Landmark, Telford Nab., MD;  Location: [facility];  Service: Gastroenterology;;  . ENDOSCOPIC RETROGRADE CHOLANGIOPANCREATOGRAPHY (ERCP) WITH PROPOFOL N/A 04/01/2018   Procedure: ENDOSCOPIC RETROGRADE CHOLANGIOPANCREATOGRAPHY (ERCP) WITH PROPOFOL;  Surgeon: Irving Copas., MD;  Location: [place];  Service: Gastroenterology;  Laterality: N/A;  . ENDOSCOPIC RETROGRADE CHOLANGIOPANCREATOGRAPHY (ERCP) WITH PROPOFOL N/A 09/12/2018   Procedure: ENDOSCOPIC RETROGRADE CHOLANGIOPANCREATOGRAPHY (ERCP) WITH PROPOFOL;  Surgeon: Rush Landmark Telford Nab., MD;  Location: [place];  Service: Gastroenterology;  Laterality: N/A;  . ERCP N/A 11/28/2018   Procedure: ENDOSCOPIC RETROGRADE CHOLANGIOPANCREATOGRAPHY (ERCP) +EGD with spyglass;  Surgeon: Rush Landmark Telford Nab., MD;  Location: [place];  Service: Gastroenterology;  Laterality: N/A;  . ESOPHAGOGASTRODUODENOSCOPY  04/29/2010  . ESOPHAGOGASTRODUODENOSCOPY (EGD) WITH PROPOFOL N/A 04/01/2018   Procedure: ESOPHAGOGASTRODUODENOSCOPY (EGD) WITH PROPOFOL;  Surgeon: Rush Landmark Telford Nab., MD;  Location: [place];  Service: Gastroenterology;  Laterality: N/A;  . ESOPHAGOGASTRODUODENOSCOPY (EGD) WITH PROPOFOL N/A 09/12/2018   Procedure: ESOPHAGOGASTRODUODENOSCOPY (EGD) WITH PROPOFOL;  Surgeon: Rush Landmark Telford Nab., MD;  Location: [place];  Service: Gastroenterology;  Laterality: N/A;  . ESOPHAGOGASTRODUODENOSCOPY  (EGD) WITH PROPOFOL N/A 11/28/2018   Procedure: ESOPHAGOGASTRODUODENOSCOPY (EGD) WITH PROPOFOL;  Surgeon: Rush Landmark Telford Nab., MD;  Location: [place];  Service: Gastroenterology;  Laterality: N/A;  . EUS  04/01/2018   Procedure: FULL UPPER ENDOSCOPIC ULTRASOUND (EUS) RADIAL;  Surgeon: Irving Copas., MD;  Location: [place];  Service: Gastroenterology;;  . EUS N/A 09/12/2018   Procedure: UPPER ENDOSCOPIC ULTRASOUND (EUS) RADIAL;  Surgeon: Irving Copas., MD;  Location: [place];  Service: Gastroenterology;  Laterality: N/A;  . FINE NEEDLE ASPIRATION  09/12/2018   Procedure: FINE NEEDLE ASPIRATION (FNA) LINEAR;  Surgeon: Irving Copas., MD;  Location: [place];  Service: Gastroenterology;;  . HEMOSTASIS CLIP PLACEMENT  09/12/2018   Procedure: HEMOSTASIS CLIP PLACEMENT;  Surgeon: Irving Copas., MD;  Location: [place];  Service: Gastroenterology;;  . I & D EXTREMITY Left 11/24/2016   Procedure: IRRIGATION AND DEBRIDEMENT LEFT HAND, THUMB, INDEX, MIDDLE, RING, AND SMALL FINGERS WITH RECONSTRUCTION;  Surgeon: Roseanne Kaufman, MD;  Location: [place];  Service: Orthopedics;  Laterality: Left;  . KNEE ARTHROSCOPY Left   . LEFT HEART CATH AND CORS/GRAFTS ANGIOGRAPHY N/A 01/05/2019   Procedure: LEFT HEART CATH AND CORS/GRAFTS ANGIOGRAPHY;  Surgeon: Nelva Bush, MD;  Location: [place] [facility];  Service: Cardiovascular;  Laterality: N/A;  . LUMBAR LAMINECTOMY     and fusion x 2  . NASAL SINUS SURGERY    . POPLITEAL SYNOVIAL CYST EXCISION    . REMOVAL OF STONES  04/01/2018   Procedure: REMOVAL OF STONES;  Surgeon: Rush Landmark Telford Nab., MD;  Location: [place];  Service: Gastroenterology;;  . REMOVAL OF STONES  09/12/2018   Procedure: REMOVAL OF STONES;  Surgeon: Irving Copas., MD;  Location: [place];  Service: Gastroenterology;;  . REMOVAL OF STONES  11/28/2018   Procedure: REMOVAL OF STONES;  Surgeon: Irving Copas., MD;  Location: [place];  Service: Gastroenterology;;  . Azzie Almas DILATION N/A 09/12/2018   Procedure: Azzie Almas DILATION;  Surgeon: Irving Copas., MD;  Location: [place];  Service: Gastroenterology;  Laterality: N/A;  . SAVORY DILATION N/A 11/28/2018   Procedure: SAVORY DILATION;  Surgeon: Rush Landmark Telford Nab., MD;  Location: [place];  Service: Gastroenterology;  Laterality: N/A;  . SPHINCTEROTOMY  04/01/2018   Procedure: SPHINCTEROTOMY;  Surgeon: Rush Landmark Telford Nab., MD;  Location: [place];  Service: Gastroenterology;;  . Bess Kinds CHOLANGIOSCOPY N/A 11/28/2018   Procedure: [id number] CHOLANGIOSCOPY;  Surgeon: Irving Copas., MD;  Location: [place];  Service: Gastroenterology;  Laterality: N/A;  . STENT REMOVAL  09/12/2018   Procedure: STENT REMOVAL;  Surgeon: Irving Copas., MD;  Location: [place];  Service: Gastroenterology;;  . Lavell Islam REMOVAL  11/28/2018   Procedure: STENT REMOVAL;  Surgeon: Irving Copas., MD;  Location: [place];  Service: Gastroenterology;;  . Lia Foyer LIFTING INJECTION  09/12/2018   Procedure: SUBMUCOSAL LIFTING INJECTION;  Surgeon: Irving Copas., MD;  Location: [place];  Service: Gastroenterology;;   Social History   Socioeconomic History  . Marital status: Married    Spouse name: Not on file  . Number of children: Not on file  . Years of education: Not on file  . Highest education level: Not on file  Occupational History  . Not on file  Tobacco Use  . Smoking status: Former Smoker    Packs/day: 0.50    Years: 5.00    Pack years: 2.50    Types: Cigarettes    Quit date: 03/30/1976    Years since quitting: 43.6  . Smokeless tobacco: Never Used  Vaping Use  . Vaping Use: Never used  Substance and Sexual Activity  . Alcohol use: No    Alcohol/week: 0.0 standard drinks  . Drug use: No  . Sexual activity: Yes  Other Topics Concern  . Not on file  Social History Narrative   . Not on file   Social Determinants of Health   Financial Resource Strain: Low Risk   . Difficulty of Paying Living Expenses: Not hard at all  Food Insecurity:   . Worried About Charity fundraiser in the Last Year: Not on file  . Ran Out of Food in the Last Year: Not on file  Transportation Needs: No Transportation Needs  . Lack of Transportation (Medical): No  . Lack of Transportation (Non-Medical):  No  Physical Activity:   . Days of Exercise per Week: Not on file  . Minutes of Exercise per Session: Not on file  Stress:   . Feeling of Stress : Not on file  Social Connections:   . Frequency of Communication with Friends and Family: Not on file  . Frequency of Social Gatherings with Friends and Family: Not on file  . Attends Religious Services: Not on file  . Active Member of Clubs or Organizations: Not on file  . Attends Archivist Meetings: Not on file  . Marital Status: Not on file  Intimate Partner Violence:   . Fear of Current or Ex-Partner: Not on file  . Emotionally Abused: Not on file  . Physically Abused: Not on file  . Sexually Abused: Not on file   Family History  Problem Relation Age of Onset  . Melanoma Mother   . Stroke Father   . Hypertension Father   . Coronary artery disease Other   . Colon cancer Neg Hx   . Esophageal cancer Neg Hx   . Stomach cancer Neg Hx   . Rectal cancer Neg Hx   . Pancreatic cancer Neg Hx   . Liver disease Neg Hx   . Inflammatory bowel disease Neg Hx    I have reviewed his medical, social, and family history in detail and updated the electronic medical record as necessary.    PHYSICAL EXAMINATION  BP 128/70   Pulse 60   Ht 5\' 9"  (1.753 m)   Wt 181 lb (82.1 kg)   SpO2 93%   BMI 26.73 kg/m  GEN: NAD, appears stated age, doesn't appear chronically ill (actually looks stronger than my last visit with him a year ago) PSYCH: Cooperative, without pressured speech EYE: Conjunctivae pink, sclerae anicteric ENT:  MMM CV: Nontachycardic RESP: No audible wheezing GI: NABS, soft, protuberant, mild tenderness to palpation in the lower abdomen, ND, without rebound or guarding MSK/EXT: Bilateral lower extremity edema SKIN: No jaundice, bruising noted from Plavix NEURO:  Alert & Oriented x 3, no focal deficits   REVIEW OF DATA  I reviewed the following data at the time of this encounter:  GI Procedures and Studies  Previously reviewed  Laboratory Studies  Reviewed in epic  Imaging Studies  No new abdominal imaging   ASSESSMENT  [name] is a 79 y.o. male with a pmh significant for CAD (status post PCI in 12/20), COPD, MDD, hypertension, hyperlipidemia, bladder cancer, sleep apnea, restless leg syndrome, Barrett's esophagus without dysplasia, chronic abdominal pain, prior pancreatitis secondary to gallstone disease, s/p CCK, distal biliary stricture NOS (with stents in place), elevated IgG4.  The patient is seen today for evaluation and management of:  1. Biliary stricture   2. Elevated IgG4   3. Functional abdominal pain syndrome   4. Lower extremity edema   5. History of ERCP    The patient is clinically stable and hemodynamically stable from a GI standpoint.  In regards to his chronic abdominal discomforts, I do believe that he likely has a functional abdominal pain.  For now he will continue his current dose of PPI as well as his fiber supplementation in effort of trying to keep things moving.  The biliary stricture which has been indeterminate for etiology although overall felt to most likely be a result of inflammatory process as a result of prior choledocholithiasis needs further evaluation and follow-up.  After it has been a full year of Plavix, I plan to proceed with  biliary stent removal and consideration of fully covered metal stenting versus leaving the patient without any biliary stents.  Repeat brushings will be obtained then we will consider the role of cholangioscopy.  I will plan  to have the patient have repeat laboratories to reevaluate and see if he has a still persistent elevated IgG4.  Certainly, the patient could be at risk of IgG4 related biliary stricturing but I would need more data before we plan to put the patient on steroids.  Also, we will want to repeat imaging to reevaluate the pancreatic portion of the biliary tree to ensure we are not seeing any sort of mass or lesion though based on how the patient has done I do not suspect that there will end up being a malignancy related stricture.  Unfortunately, right now the biggest thing will be for him to have further evaluation from a cardiology perspective in regards to his increasing shortness of breath and dyspnea on exertion.  My plan will be for ERCP at the end of December beginning of January when he can come off Plavix for 5 full days.  We will work with our cardiology colleagues to ensure we able to do that moving forward.  The risks of an ERCP were discussed at length, including but not limited to the risk of perforation, bleeding, abdominal pain, post-ERCP pancreatitis (while usually mild can be severe and even life threatening).  The risks and benefits of endoscopic evaluation were discussed with the patient; these include but are not limited to the risk of perforation, infection, bleeding, missed lesions, lack of diagnosis, severe illness requiring hospitalization, as well as anesthesia and sedation related illnesses.  The patient is agreeable to proceed.  All patient questions were answered to the best of my ability, and the patient agrees to the aforementioned plan of action with follow-up as indicated.   PLAN  Consider SIBO breath testing in future Continue Bentyl as needed 2-4 times daily Plan for repeat ERCP with stent removal and potential repeat brushings or replacement of fully covered self-expanding metal stent in end of December versus early January (will need to get cardiology approval to come off  Plavix) We will consider TCA in future though patient feels okay with just monitoring symptoms for now He wants to hold off on flexible sigmoidoscopy  Repeat IgG4 Follow-up hepatic function panel Proceed with pancreas protocol CT abdomen   Orders Placed This Encounter  Procedures  . Hepatic function panel    New Prescriptions   No medications on file   Modified Medications   No medications on file    Planned Follow Up No follow-ups on file.   Total Time in Face-to-Face and in Coordination of Care for patient including independent/personal interpretation/review of prior testing, medical history, examination, medication adjustment, communicating results with the patient directly, and documentation with the EHR is 30 minutes.   Justice Britain, MD [facility] Office # [phone]

## 2019-11-10 NOTE — Progress Notes (Signed)
Cardiology Clinic Note   Patient Name: [name] Date of Encounter: 11/13/2019  Primary Care Provider:  Isaac Bliss, Rayford Halsted, MD Primary Cardiologist:  [name], MD  Patient Profile    [name] 79 year old male presents to the clinic today for follow-up evaluation of his coronary artery disease, essential hypertension, and acute on chronic diastolic CHF.  Past Medical History    Past Medical History:  Diagnosis Date  . Allergy   . B12 deficiency anemia   . Blood transfusion without reported diagnosis   . CAD (coronary artery disease)   . Cancer ([place])    bladder-   . Colon polyps   . COPD (chronic obstructive pulmonary disease) ([place])   . Depression   . Esophagus, Barrett's   . GERD (gastroesophageal reflux disease)   . History of bladder cancer    Bladder cancer "8 times"  . History of hiatal hernia   . Hyperlipidemia   . Hypertension   . Localized osteoarthrosis, lower leg   . Restless leg syndrome   . Sleep apnea    does not wear cpap  . Stenosis of esophagus    Past Surgical History:  Procedure Laterality Date  . BILIARY BRUSHING  04/01/2018   Procedure: BILIARY BRUSHING;  Surgeon: Rush Landmark Telford Nab., MD;  Location: [place];  Service: Gastroenterology;;  . BILIARY BRUSHING  09/12/2018   Procedure: BILIARY BRUSHING;  Surgeon: Irving Copas., MD;  Location: [place];  Service: Gastroenterology;;  . BILIARY BRUSHING  11/28/2018   Procedure: BILIARY BRUSHING;  Surgeon: Irving Copas., MD;  Location: [place];  Service: Gastroenterology;;  . BILIARY DILATION  09/12/2018   Procedure: BILIARY DILATION;  Surgeon: Irving Copas., MD;  Location: [place];  Service: Gastroenterology;;  . BILIARY DILATION  11/28/2018   Procedure: BILIARY DILATION;  Surgeon: Irving Copas., MD;  Location: [place];  Service: Gastroenterology;;  . BILIARY STENT PLACEMENT  04/01/2018   Procedure: BILIARY  STENT PLACEMENT;  Surgeon: Irving Copas., MD;  Location: [place];  Service: Gastroenterology;;  . BILIARY STENT PLACEMENT  09/12/2018   Procedure: BILIARY STENT PLACEMENT;  Surgeon: Irving Copas., MD;  Location: [place];  Service: Gastroenterology;;  . BILIARY STENT PLACEMENT  11/28/2018   Procedure: BILIARY STENT PLACEMENT;  Surgeon: Irving Copas., MD;  Location: [place];  Service: Gastroenterology;;  . BIOPSY  04/01/2018   Procedure: BIOPSY;  Surgeon: Irving Copas., MD;  Location: [place];  Service: Gastroenterology;;  . BIOPSY  09/12/2018   Procedure: BIOPSY;  Surgeon: Irving Copas., MD;  Location: [place];  Service: Gastroenterology;;  . BIOPSY  11/28/2018   Procedure: BIOPSY;  Surgeon: Irving Copas., MD;  Location: [facility] ENDOSCOPY;  Service: Gastroenterology;;  . bladder cancer      x 8 cystoscopy  . CERVICAL DISCECTOMY     ACDF  . COLONOSCOPY  11/17/2005   normal   . CORONARY ARTERY BYPASS GRAFT     x4  . CORONARY STENT INTERVENTION N/A 01/05/2019   Procedure: CORONARY STENT INTERVENTION;  Surgeon: Nelva Bush, MD;  Location: [facility];  Service: Cardiovascular;  Laterality: N/A;  . ENDOSCOPIC MUCOSAL RESECTION  09/12/2018   Procedure: ENDOSCOPIC MUCOSAL RESECTION;  Surgeon: Rush Landmark, Telford Nab., MD;  Location: [facility] ENDOSCOPY;  Service: Gastroenterology;;  . ENDOSCOPIC RETROGRADE CHOLANGIOPANCREATOGRAPHY (ERCP) WITH PROPOFOL N/A 04/01/2018   Procedure: ENDOSCOPIC RETROGRADE CHOLANGIOPANCREATOGRAPHY (ERCP) WITH PROPOFOL;  Surgeon: Irving Copas., MD;  Location: [place];  Service:  Gastroenterology;  Laterality: N/A;  . ENDOSCOPIC RETROGRADE CHOLANGIOPANCREATOGRAPHY (ERCP) WITH PROPOFOL N/A 09/12/2018   Procedure: ENDOSCOPIC RETROGRADE CHOLANGIOPANCREATOGRAPHY (ERCP) WITH PROPOFOL;  Surgeon: Rush Landmark Telford Nab., MD;  Location: [place];  Service: Gastroenterology;  Laterality:  N/A;  . ERCP N/A 11/28/2018   Procedure: ENDOSCOPIC RETROGRADE CHOLANGIOPANCREATOGRAPHY (ERCP) +EGD with spyglass;  Surgeon: Rush Landmark Telford Nab., MD;  Location: [place];  Service: Gastroenterology;  Laterality: N/A;  . ESOPHAGOGASTRODUODENOSCOPY  04/29/2010  . ESOPHAGOGASTRODUODENOSCOPY (EGD) WITH PROPOFOL N/A 04/01/2018   Procedure: ESOPHAGOGASTRODUODENOSCOPY (EGD) WITH PROPOFOL;  Surgeon: Rush Landmark Telford Nab., MD;  Location: [place];  Service: Gastroenterology;  Laterality: N/A;  . ESOPHAGOGASTRODUODENOSCOPY (EGD) WITH PROPOFOL N/A 09/12/2018   Procedure: ESOPHAGOGASTRODUODENOSCOPY (EGD) WITH PROPOFOL;  Surgeon: Rush Landmark Telford Nab., MD;  Location: [place];  Service: Gastroenterology;  Laterality: N/A;  . ESOPHAGOGASTRODUODENOSCOPY (EGD) WITH PROPOFOL N/A 11/28/2018   Procedure: ESOPHAGOGASTRODUODENOSCOPY (EGD) WITH PROPOFOL;  Surgeon: Rush Landmark Telford Nab., MD;  Location: [place];  Service: Gastroenterology;  Laterality: N/A;  . EUS  04/01/2018   Procedure: FULL UPPER ENDOSCOPIC ULTRASOUND (EUS) RADIAL;  Surgeon: Irving Copas., MD;  Location: [place];  Service: Gastroenterology;;  . EUS N/A 09/12/2018   Procedure: UPPER ENDOSCOPIC ULTRASOUND (EUS) RADIAL;  Surgeon: Irving Copas., MD;  Location: [place];  Service: Gastroenterology;  Laterality: N/A;  . FINE NEEDLE ASPIRATION  09/12/2018   Procedure: FINE NEEDLE ASPIRATION (FNA) LINEAR;  Surgeon: Irving Copas., MD;  Location: [place];  Service: Gastroenterology;;  . HEMOSTASIS CLIP PLACEMENT  09/12/2018   Procedure: HEMOSTASIS CLIP PLACEMENT;  Surgeon: Irving Copas., MD;  Location: [facility] ENDOSCOPY;  Service: Gastroenterology;;  . I & D EXTREMITY Left 11/24/2016   Procedure: IRRIGATION AND DEBRIDEMENT LEFT HAND, THUMB, INDEX, MIDDLE, RING, AND SMALL FINGERS WITH RECONSTRUCTION;  Surgeon: Roseanne Kaufman, MD;  Location: [place];  Service: Orthopedics;  Laterality: Left;  .  KNEE ARTHROSCOPY Left   . LEFT HEART CATH AND CORS/GRAFTS ANGIOGRAPHY N/A 01/05/2019   Procedure: LEFT HEART CATH AND CORS/GRAFTS ANGIOGRAPHY;  Surgeon: Nelva Bush, MD;  Location: [place] [facility];  Service: Cardiovascular;  Laterality: N/A;  . LUMBAR LAMINECTOMY     and fusion x 2  . NASAL SINUS SURGERY    . POPLITEAL SYNOVIAL CYST EXCISION    . REMOVAL OF STONES  04/01/2018   Procedure: REMOVAL OF STONES;  Surgeon: Rush Landmark Telford Nab., MD;  Location: [place];  Service: Gastroenterology;;  . REMOVAL OF STONES  09/12/2018   Procedure: REMOVAL OF STONES;  Surgeon: Irving Copas., MD;  Location: [place];  Service: Gastroenterology;;  . REMOVAL OF STONES  11/28/2018   Procedure: REMOVAL OF STONES;  Surgeon: Irving Copas., MD;  Location: [place];  Service: Gastroenterology;;  . Azzie Almas DILATION N/A 09/12/2018   Procedure: Azzie Almas DILATION;  Surgeon: Irving Copas., MD;  Location: [place];  Service: Gastroenterology;  Laterality: N/A;  . SAVORY DILATION N/A 11/28/2018   Procedure: SAVORY DILATION;  Surgeon: Rush Landmark Telford Nab., MD;  Location: [place];  Service: Gastroenterology;  Laterality: N/A;  . SPHINCTEROTOMY  04/01/2018   Procedure: SPHINCTEROTOMY;  Surgeon: Rush Landmark Telford Nab., MD;  Location: [place];  Service: Gastroenterology;;  . Bess Kinds CHOLANGIOSCOPY N/A 11/28/2018   Procedure: WGNFAOZH CHOLANGIOSCOPY;  Surgeon: Irving Copas., MD;  Location: [place];  Service: Gastroenterology;  Laterality: N/A;  . STENT REMOVAL  09/12/2018   Procedure: STENT REMOVAL;  Surgeon: Irving Copas., MD;  Location: [place];  Service: Gastroenterology;;  . Lavell Islam REMOVAL  11/28/2018  Procedure: STENT REMOVAL;  Surgeon: Irving Copas., MD;  Location: [place];  Service: Gastroenterology;;  . Lia Foyer LIFTING INJECTION  09/12/2018   Procedure: SUBMUCOSAL LIFTING INJECTION;  Surgeon: Irving Copas., MD;  Location: [facility] ENDOSCOPY;  Service: Gastroenterology;;    Allergies  Allergies  Allergen Reactions  . Losartan Potassium Other (See Comments)    Hyperkalemia  . Tape Itching and Rash    reddened skin     History of Present Illness    Mr. Dowson has a PMH of coronary artery disease status post CABG 2001 LIMA-LAD, SVG to marginal, SVG to RCA.  Cardiac catheterization 2004 showed patent grafts.  Echocardiogram 8/12 showed EF 40-45% with moderate hypokinesis, G1 DD, aortic sclerosis.  Nuclear stress test 11/16 showed an EF of 54%, normal perfusion.  Echocardiogram 12/19 showed an EF of 60-65%, G1 DD, mild aortic valve stenosis.  Nuclear stress test 12/19 showed low risk and normal perfusion.  Spring 2020 he had abdominal discomfort which ended up being cholangeitis and E. coli sepsis.  During his hospitalization for cholecystectomy he had a NSTEMI.  His repeat cardiac catheterization December 2020 showed 99% subtotal occlusion SVG-RPDA/RPL.  He was treated with 3 overlapping DES.  He was continued on his aspirin Plavix.  Echocardiogram showed an EF of 40-45%, severe hypokinesis of inferior and anterior septal wall, G1 DD, mild MR.  He presented to his PCP on 11/09/2019.  He complained of progressive dyspnea on exertion.  He was not able to walk from his house to his car without stopping to take several breaks.  He denied chest pain.  He felt like his legs were significantly swollen.  He indicated when he laid down he felt like someone was smothering below.  Case was discussed with Dr. Oval Linsey.  He was started on Lasix 40 mg twice daily.  He was continued on his Bystolic, Imdur, aspirin, Plavix, statin.  He presents to the clinic today for follow-up evaluation and states he is breathing much better today.  He states that he had a significant amount of swelling in his lower extremities.  He has lower extremity support stockings but has not been wearing them.  He indicated that he  stays away from using extra salt in his food.  However, he does not look for low-sodium options.  He was not taking his furosemide twice daily.  He was taking it daily.  He states that prior to this event he was taking furosemide 1 time per week.  I will continue his furosemide daily, repeat BMP, have him follow-up in 3 months, give salty 6, give  support stockings sheet, and have him weigh himself daily.  Today he denies chest pain, shortness of breath, lower extremity edema, fatigue, palpitations, melena, hematuria, hemoptysis, diaphoresis, weakness, presyncope, syncope, orthopnea, and PND.   Home Medications    Prior to Admission medications   Medication Sig Start Date End Date Taking? Authorizing Provider  acetaminophen (TYLENOL) 325 MG tablet Take 650 mg by mouth every 6 (six) hours as needed for mild pain.    [provider]  amLODipine (NORVASC) 2.5 MG tablet TAKE 2 TABLETS BY MOUTH DAILY 09/26/19   Troy Sine, MD  aspirin EC 81 MG tablet Take 81 mg by mouth at bedtime.    [provider]  BYSTOLIC 10 MG tablet Take 1 tablet by mouth once daily 08/03/19   Troy Sine, MD  clopidogrel (PLAVIX) 75 MG tablet Take 1 tablet (75 mg total) by mouth daily.  04/11/19   Troy Sine, MD  cyanocobalamin (,VITAMIN B-12,) 1000 MCG/ML injection INJECT 1ML INTO THE MUSCLE FOR 1 DOSE 01/17/19   Isaac Bliss, Rayford Halsted, MD  dicyclomine (BENTYL) 10 MG capsule Take 1 capsule (10 mg total) by mouth 4 (four) times daily -  before meals and at bedtime. 01/19/19   Mansouraty, Telford Nab., MD  doxycycline (VIBRAMYCIN) 100 MG capsule Take 1 capsule (100 mg total) by mouth 2 (two) times daily. 05/08/19   Burchette, Alinda Sierras, MD  ezetimibe (ZETIA) 10 MG tablet Take 1 tablet (10 mg total) by mouth daily. 01/17/19   Isaac Bliss, Rayford Halsted, MD  Fiber Complete TABS Take 1 each by mouth daily.     [provider]  fluticasone (FLONASE) 50 MCG/ACT nasal spray Place 1-2 sprays  into both nostrils at bedtime as needed for allergies or rhinitis.    [provider]  furosemide (LASIX) 40 MG tablet Take 1 tablet (40 mg total) by mouth 2 (two) times daily. 11/09/19   Isaac Bliss, Rayford Halsted, MD  isosorbide mononitrate (IMDUR) 60 MG 24 hr tablet Take 1 tablet by mouth once daily 05/08/19   Troy Sine, MD  loratadine (CLARITIN) 10 MG tablet Take 10 mg by mouth daily.    [provider]  Menthol, Topical Analgesic, (BENGAY EX) Apply 1 application topically daily as needed (pain).    [provider]  Multiple Vitamins-Minerals (MULTIVITAMIN WITH MINERALS) tablet Take 1 tablet by mouth daily.    [provider]  Naphazoline-Pheniramine (OPCON-A) 0.027-0.315 % SOLN Place 1 drop into both eyes daily as needed (itching eyes).    [provider]  NEEDLE, DISP, 25 G (B-D DISP NEEDLE 25GX1") 25G X 1" MISC Inject 1000 mcg into muscle once a month. 12/24/17   Isaac Bliss, Rayford Halsted, MD  nitroGLYCERIN (NITROSTAT) 0.4 MG SL tablet Place 1 tablet (0.4 mg total) under the tongue every 5 (five) minutes as needed for chest pain. 10/05/18   Isaac Bliss, Rayford Halsted, MD  omeprazole (PRILOSEC) 40 MG capsule Take 1 capsule by mouth once daily Patient taking differently: 20 mg.  10/30/19   Mansouraty, Telford Nab., MD  oxyCODONE-acetaminophen (PERCOCET) 10-325 MG tablet Take 1 tablet by mouth 2 (two) times daily as needed for pain. 11/01/19   Isaac Bliss, Rayford Halsted, MD  Probiotic Product (PROBIOTIC ADVANCED PO) Take 1 tablet by mouth 2 (two) times daily.     [provider]  rOPINIRole (REQUIP) 2 MG tablet TAKE 1 TABLET BY MOUTH AT BEDTIME 10/30/19   Isaac Bliss, Rayford Halsted, MD  rosuvastatin (CRESTOR) 10 MG tablet Take 1 tablet (10 mg total) by mouth daily. 07/13/19   Troy Sine, MD  saccharomyces boulardii (FLORASTOR) 250 MG capsule Take 1 capsule (250 mg total) by mouth 2 (two) times daily. 01/17/19   Isaac Bliss, Rayford Halsted, MD  venlafax[facility]) 75 MG tablet Take 1 tablet by mouth once daily 10/16/19   Isaac Bliss, Rayford Halsted, MD    Family History    Family History  Problem Relation Age of Onset  . Melanoma Mother   . Stroke Father   . Hypertension Father   . Coronary artery disease Other   . Colon cancer Neg Hx   . Esophageal cancer Neg Hx   . Stomach cancer Neg Hx   . Rectal cancer Neg Hx   . Pancreatic cancer Neg Hx   . Liver disease Neg Hx   . Inflammatory bowel disease Neg Hx  He indicated that his mother is deceased. He indicated that his father is deceased. He indicated that the status of his neg hx is unknown. He indicated that the status of his other is unknown.  Social History    Social History   Socioeconomic History  . Marital status: Married    Spouse name: Not on file  . Number of children: Not on file  . Years of education: Not on file  . Highest education level: Not on file  Occupational History  . Not on file  Tobacco Use  . Smoking status: Former Smoker    Packs/day: 0.50    Years: 5.00    Pack years: 2.50    Types: Cigarettes    Quit date: 03/30/1976    Years since quitting: 43.6  . Smokeless tobacco: Never Used  Vaping Use  . Vaping Use: Never used  Substance and Sexual Activity  . Alcohol use: No    Alcohol/week: 0.0 standard drinks  . Drug use: No  . Sexual activity: Yes  Other Topics Concern  . Not on file  Social History Narrative  . Not on file   Social Determinants of Health   Financial Resource Strain: Low Risk   . Difficulty of Paying Living Expenses: Not hard at all  Food Insecurity:   . Worried About Charity fundraiser in the Last Year: Not on file  . Ran Out of Food in the Last Year: Not on file  Transportation Needs: No Transportation Needs  . Lack of Transportation (Medical): No  . Lack of Transportation (Non-Medical): No  Physical Activity:   . Days of Exercise per Week: Not on file  . Minutes of Exercise per Session: Not on  file  Stress:   . Feeling of Stress : Not on file  Social Connections:   . Frequency of Communication with Friends and Family: Not on file  . Frequency of Social Gatherings with Friends and Family: Not on file  . Attends Religious Services: Not on file  . Active Member of Clubs or Organizations: Not on file  . Attends Archivist Meetings: Not on file  . Marital Status: Not on file  Intimate Partner Violence:   . Fear of Current or Ex-Partner: Not on file  . Emotionally Abused: Not on file  . Physically Abused: Not on file  . Sexually Abused: Not on file     Review of Systems    General:  No chills, fever, night sweats or weight changes.  Cardiovascular:  No chest pain, dyspnea on exertion, edema, orthopnea, palpitations, paroxysmal nocturnal dyspnea. Dermatological: No rash, lesions/masses Respiratory: No cough, dyspnea Urologic: No hematuria, dysuria Abdominal:   No nausea, vomiting, diarrhea, bright red blood per rectum, melena, or hematemesis Neurologic:  No visual changes, wkns, changes in mental status. All other systems reviewed and are otherwise negative except as noted above.  Physical Exam    VS:  BP 134/60 (BP Location: Left Arm, Patient Position: Sitting, Cuff Size: Normal)   Pulse 67   Ht 5\' 9"  (1.753 m)   Wt 175 lb 12.8 oz (79.7 kg)   SpO2 91%   BMI 25.96 kg/m  , BMI Body mass index is 25.96 kg/m. GEN: Well nourished, well developed, in no acute distress. HEENT: normal. Neck: Supple, no JVD, carotid bruits, or masses. Cardiac: RRR, no murmurs, rubs, or gallops. No clubbing, cyanosis, edema.  Radials/DP/PT 2+ and equal bilaterally.  Respiratory:  Respirations regular and unlabored, clear to auscultation bilaterally. GI:  Soft, nontender, nondistended, BS + x 4. MS: no deformity or atrophy. Skin: warm and dry, no rash. Neuro:  Strength and sensation are intact. Psych: Normal affect.  Accessory Clinical Findings    Recent Labs: 05/10/2019: BNP  190.9 07/20/2019: TSH 3.040 11/09/2019: ALT 8; BUN 25; Creat 1.61; Hemoglobin 9.7; Platelets 172; Potassium 4.5; Sodium 143   Recent Lipid Panel    Component Value Date/Time   CHOL 113 07/20/2019 0850   CHOL 116 03/30/2013 1053   TRIG 145 07/20/2019 0850   TRIG 80 03/30/2013 1053   HDL 32 (L) 07/20/2019 0850   HDL 34 (L) 03/30/2013 1053   CHOLHDL 3.5 07/20/2019 0850   CHOLHDL 2.5 03/31/2018 0508   VLDL 14 03/31/2018 0508   LDLCALC 56 07/20/2019 0850   LDLCALC 66 03/30/2013 1053   LDLDIRECT 90.3 04/17/2008 0911    ECG personally reviewed by me today-none today.  Echocardiogram 04/10/2019 IMPRESSIONS    1. Left ventricular ejection fraction, by estimation, is 50 to 55%. The  left ventricle has low normal function. The left ventricle has no regional  wall motion abnormalities. Left ventricular diastolic parameters are  consistent with Grade II diastolic  dysfunction (pseudonormalization).  2. Right ventricular systolic function is normal. The right ventricular  size is normal. There is moderately elevated pulmonary artery systolic  pressure.  3. Left atrial size was moderately dilated.  4. The mitral valve is normal in structure. Trivial mitral valve  regurgitation. No evidence of mitral stenosis.  5. The aortic valve is grossly normal. Aortic valve regurgitation is  trivial. No aortic stenosis is present.   Cardiac catheterization 01/05/2019 1. Severe native coronary artery disease, including 75% ostial LMCA stenosis, 50% mid LAD disease, sequential 90% and 70% proximal/mid LCx stenoses as well as 70% mid OM1 lesion, and sequential 70% ostial, 80% proximal, and 95% distal RCA stenoses. 2. Widely patent LIMA-LAD. 3. Chronically occluded SVG-OM1. 4. Subtotally occluded SVG-rPDA-rPL with 99% mid graft stenosis followed by extensive thrombus, some of which has embolized into the distal branches. 5. Upper normal left ventricular filling pressure. 6. Successful PCI to  SVG-rPDA-rPL using overlapping Resolute Onyx 3.0 x 18 mm, 3.0 x 38 mm, and 3.0 x 38 mm drug-eluting stents with 0% residual stenosis and TIMI-3 flow.  Small amount of nonocclusive thrombus remained in the rPL branch at the end of the procedure.  Recommendations: 1. Indefinite dual antiplatelet therapy with aspirin and clopidogrel, as tolerated. 2. Medical therapy of protected LMCA and LCx disease.  If the patient has refractory symptoms, complex PCI of the ostial LMCA, LCx, and OM1 would need to be considered. 3. Aggressive secondary prevention. 4. Gentle post-catheterization hydration with close monitoring of renal function and hemoglobin, given contrast exposure and glycoprotein IIb/IIIa use during the intervention.  Diagnostic Dominance: Right  Intervention     Assessment & Plan   1.  Acute on chronic diastolic CHF-breathing much improved today.  Had presented to PCP 11/09/2019 with complaints of increased DOE and lower extremity swelling.  Weight today 175.8.  Was 180.8 pounds with weight at PCP clinic. Continue furosemide, Bystolic, Heart healthy low-sodium diet-salty 6 given 1800 mg sodium restriction Increase physical activity as tolerated Daily weights-contact office with weight increase of 3 pounds overnight or 5 pounds in 1 week Lower extremity support stockings-Cool sheet given. Elevate lower extremities when not active Order BMP  Coronary artery disease-no chest pain today.  Had non-STEMI and underwent cardiac catheterization 01/05/2019 received DES x3 to his SVG-RPDA/RPL graft Continue amlodipine, Bystolic,  clopidogrel, Zetia, Imdur, nitroglycerin, rosuvastatin Heart healthy low-sodium diet-salty 6 given Increase physical activity as tolerated  Essential hypertension-BP today 134/60.  Well-controlled at home Continue Bystolic, amlodipine, Imdur Heart healthy low-sodium diet-salty 6 given Increase physical activity as tolerated  Hyperlipidemia-LDL 56 on  07/20/2019 Continue Zetia, rosuvastatin Heart healthy low-sodium high-fiber diet Increase physical activity as tolerated  Disposition: Follow-up with Dr. Claiborne Billings in 3 months.  Jossie Ng. Shamarra Warda NP-C    11/13/2019, 9:42 AM [address] Office [phone] Fax [phone]  Notice: This dictation was prepared with Dragon dictation along with smaller phrase technology. Any transcriptional errors that result from this process are unintentional and may not be corrected upon review.

## 2019-11-11 ENCOUNTER — Other Ambulatory Visit: Payer: Self-pay | Admitting: Internal Medicine

## 2019-11-11 DIAGNOSIS — D619 Aplastic anemia, unspecified: Secondary | ICD-10-CM

## 2019-11-11 DIAGNOSIS — D649 Anemia, unspecified: Secondary | ICD-10-CM

## 2019-11-11 NOTE — Progress Notes (Signed)
cb

## 2019-11-12 ENCOUNTER — Encounter: Payer: Self-pay | Admitting: Gastroenterology

## 2019-11-12 DIAGNOSIS — R600 Localized edema: Secondary | ICD-10-CM | POA: Insufficient documentation

## 2019-11-12 DIAGNOSIS — R109 Unspecified abdominal pain: Secondary | ICD-10-CM | POA: Insufficient documentation

## 2019-11-13 ENCOUNTER — Other Ambulatory Visit: Payer: Self-pay

## 2019-11-13 ENCOUNTER — Encounter: Payer: Self-pay | Admitting: General Practice

## 2019-11-13 ENCOUNTER — Ambulatory Visit: Payer: Medicare Other | Admitting: General Practice

## 2019-11-13 VITALS — BP 134/60 | HR 67 | Ht 69.0 in | Wt 175.8 lb

## 2019-11-13 DIAGNOSIS — Z79899 Other long term (current) drug therapy: Secondary | ICD-10-CM | POA: Diagnosis not present

## 2019-11-13 DIAGNOSIS — I10 Essential (primary) hypertension: Secondary | ICD-10-CM | POA: Diagnosis not present

## 2019-11-13 DIAGNOSIS — I25709 Atherosclerosis of coronary artery bypass graft(s), unspecified, with unspecified angina pectoris: Secondary | ICD-10-CM | POA: Diagnosis not present

## 2019-11-13 DIAGNOSIS — I5033 Acute on chronic diastolic (congestive) heart failure: Secondary | ICD-10-CM

## 2019-11-13 DIAGNOSIS — E785 Hyperlipidemia, unspecified: Secondary | ICD-10-CM

## 2019-11-13 LAB — BASIC METABOLIC PANEL
BUN/Creatinine Ratio: 14 (ref 10–24)
BUN: 23 mg/dL (ref 8–27)
CO2: 30 mmol/L — ABNORMAL HIGH (ref 20–29)
Calcium: 9.1 mg/dL (ref 8.6–10.2)
Chloride: 101 mmol/L (ref 96–106)
Creatinine, Ser: 1.62 mg/dL — ABNORMAL HIGH (ref 0.76–1.27)
GFR calc Af Amer: 47 mL/min/{1.73_m2} — ABNORMAL LOW (ref >59)
GFR calc non Af Amer: 40 mL/min/{1.73_m2} — ABNORMAL LOW (ref >59)
Glucose: 138 mg/dL — ABNORMAL HIGH (ref 65–99)
Potassium: 4.4 mmol/L (ref 3.5–5.2)
Sodium: 145 mmol/L — ABNORMAL HIGH (ref 134–144)

## 2019-11-13 NOTE — Patient Instructions (Signed)
Medication Instructions:  The current medical regimen is effective;  continue present plan and medications as directed. Please refer to the Current Medication list given to you today.; *If you need a refill on your cardiac medications before your next appointment, please call your pharmacy*  Lab Work: BMET TODAY If you have labs (blood work) drawn today and your tests are completely normal, you will receive your results only by:  [place] (if you have MyChart) OR A paper copy in the mail.  If you have any lab test that is abnormal or we need to change your treatment, we will call you to review the results. You may go to any [facility] that is convenient for you however, we do have a lab in our office that is able to assist you. You DO NOT need an appointment for our lab. The lab is open 8:00am and closes at 4:00pm. Lunch 12:45 - 1:45pm.  Special Instructions FLUID RESTRICTION NO MORE THEN 48 OZ PER DAY OF ALL FLUIDS  PLEASE PURCHASE AND WEAR COMPRESSION STOCKINGS DAILY AND OFF AT BEDTIME. Compression stockings are elastic socks that squeeze the legs. They help to increase blood flow to the legs and to decrease swelling in the legs from fluid retention, and reduce the chance of developing blood clots in the lower legs. Please put on in the AM when dressing and off at night when dressing for bed. PLEASE MAKE SURE TO ELEVATE YOUR FEET & LEGS WHILE SITTING, THIS WILL HELP WITH THE SWELLING ALSO.  TAKE AND LOG YOUR DAILY WEIGHT, CALL IF YOUR WEIGHT HAS INCREASE >3 lbs/DAY OR 5 lbs/WEEK. BRING LOG TO FOLLOW UP APPOINTMENT  PLEASE READ AND FOLLOW SALTY 6-ATTACHED  PLEASE INCREASE PHYSICAL ACTIVITY AS TOLERATED  Follow-Up: Your next appointment:  3 month(s) In Person with [name], MD -[place], FNP-C   At [facility], you and your health needs are our priority.  As part of our continuing mission to provide you with exceptional heart care, we have created designated Provider Care  Teams.  These Care Teams include your primary Cardiologist (physician) and Advanced Practice Providers (APPs -  Physician Assistants and Nurse Practitioners) who all work together to provide you with the care you need, when you need it.  We recommend signing up for the patient portal called "MyChart".  Sign up information is provided on this After Visit Summary.  MyChart is used to connect with patients for Virtual Visits (Telemedicine).  Patients are able to view lab/test results, encounter notes, upcoming appointments, etc.  Non-urgent messages can be sent to your provider as well.   To learn more about what you can do with MyChart, go to [email or web address].

## 2019-11-17 ENCOUNTER — Telehealth: Payer: Self-pay | Admitting: General Practice

## 2019-11-17 DIAGNOSIS — I5033 Acute on chronic diastolic (congestive) heart failure: Secondary | ICD-10-CM

## 2019-11-17 NOTE — Telephone Encounter (Signed)
Patient returning call for lab results. 

## 2019-11-22 MED ORDER — FUROSEMIDE 40 MG PO TABS: 40.0000 mg | ORAL_TABLET | ORAL | 1 refills | Status: DC

## 2019-11-22 NOTE — Telephone Encounter (Signed)
Pt aware of lab results and new directions verbalizes understanding ./cy

## 2019-11-22 NOTE — Telephone Encounter (Signed)
Patient returning call in regards to lab results.  

## 2019-11-22 NOTE — Telephone Encounter (Signed)
Jacob Pelton, NP  11/14/2019 11:07 AM EDT     Please contact Mr. [name] and let him know that his lab work from yesterday has been reviewed. We will change his furosemide to 40 mg every other day. No further testing is needed at this time. He will need to continue to follow a low-sodium diet, keep his fluids to monitor 64 ounces daily, and continue his daily weights. Thank you.

## 2019-12-07 ENCOUNTER — Telehealth: Payer: Self-pay | Admitting: Internal Medicine

## 2019-12-07 ENCOUNTER — Other Ambulatory Visit: Payer: Self-pay | Admitting: Internal Medicine

## 2019-12-07 DIAGNOSIS — G8929 Other chronic pain: Secondary | ICD-10-CM

## 2019-12-07 DIAGNOSIS — M545 Low back pain, unspecified: Secondary | ICD-10-CM

## 2019-12-07 MED ORDER — OXYCODONE-ACETAMINOPHEN 10-325 MG PO TABS: 1.0000 | ORAL_TABLET | Freq: Two times a day (BID) | ORAL | 0 refills | Status: DC | PRN

## 2019-12-07 NOTE — Telephone Encounter (Signed)
Rx sent 

## 2019-12-07 NOTE — Telephone Encounter (Signed)
Last office visit:  11/09/19

## 2019-12-07 NOTE — Telephone Encounter (Signed)
Pt is calling in stating that he would like to get a refill on Rx Oxycodone-acetaminophen (PERCOCET) 10-325 MG stating that he only has 2 pills and he normally gets three script of the Rx.  Pharm:  [facility] on [address].

## 2019-12-12 ENCOUNTER — Other Ambulatory Visit: Payer: Self-pay | Admitting: Internal Medicine

## 2019-12-13 DIAGNOSIS — L821 Other seborrheic keratosis: Secondary | ICD-10-CM | POA: Diagnosis not present

## 2019-12-13 DIAGNOSIS — L812 Freckles: Secondary | ICD-10-CM | POA: Diagnosis not present

## 2019-12-13 DIAGNOSIS — L570 Actinic keratosis: Secondary | ICD-10-CM | POA: Diagnosis not present

## 2019-12-13 DIAGNOSIS — Z85828 Personal history of other malignant neoplasm of skin: Secondary | ICD-10-CM | POA: Diagnosis not present

## 2019-12-13 DIAGNOSIS — C4442 Squamous cell carcinoma of skin of scalp and neck: Secondary | ICD-10-CM | POA: Diagnosis not present

## 2019-12-22 ENCOUNTER — Other Ambulatory Visit: Payer: Self-pay

## 2020-01-06 ENCOUNTER — Other Ambulatory Visit: Payer: Self-pay | Admitting: Internal Medicine

## 2020-01-14 ENCOUNTER — Other Ambulatory Visit: Payer: Self-pay | Admitting: Internal Medicine

## 2020-01-14 DIAGNOSIS — E038 Other specified hypothyroidism: Secondary | ICD-10-CM

## 2020-01-16 ENCOUNTER — Other Ambulatory Visit: Payer: Self-pay | Admitting: *Deleted

## 2020-01-16 MED ORDER — EZETIMIBE 10 MG PO TABS
10.0000 mg | ORAL_TABLET | Freq: Every day | ORAL | 1 refills | Status: DC
Start: 2020-01-16 — End: 2020-07-16

## 2020-01-23 NOTE — Progress Notes (Signed)
Cardiology Clinic Note   Patient Name: [name] Date of Encounter: 01/26/2020  Primary Care Provider:  Isaac Bliss, Rayford Halsted, MD Primary Cardiologist:  [name], MD  Patient Profile    [name] 80 year old male presents to the clinic today for follow-up evaluation of his coronary artery disease, essential hypertension, and acute on chronic diastolic CHF.  Past Medical History    Past Medical History:  Diagnosis Date  . Allergy   . B12 deficiency anemia   . Blood transfusion without reported diagnosis   . CAD (coronary artery disease)   . Cancer ([place])    bladder-   . Colon polyps   . COPD (chronic obstructive pulmonary disease) ([place])   . Depression   . Esophagus, Barrett's   . GERD (gastroesophageal reflux disease)   . History of bladder cancer    Bladder cancer "8 times"  . History of hiatal hernia   . Hyperlipidemia   . Hypertension   . Localized osteoarthrosis, lower leg   . Restless leg syndrome   . Sleep apnea    does not wear cpap  . Stenosis of esophagus    Past Surgical History:  Procedure Laterality Date  . BILIARY BRUSHING  04/01/2018   Procedure: BILIARY BRUSHING;  Surgeon: Rush Landmark Telford Nab., MD;  Location: [place];  Service: Gastroenterology;;  . BILIARY BRUSHING  09/12/2018   Procedure: BILIARY BRUSHING;  Surgeon: Irving Copas., MD;  Location: [place];  Service: Gastroenterology;;  . BILIARY BRUSHING  11/28/2018   Procedure: BILIARY BRUSHING;  Surgeon: Irving Copas., MD;  Location: [place];  Service: Gastroenterology;;  . BILIARY DILATION  09/12/2018   Procedure: BILIARY DILATION;  Surgeon: Irving Copas., MD;  Location: [place];  Service: Gastroenterology;;  . BILIARY DILATION  11/28/2018   Procedure: BILIARY DILATION;  Surgeon: Irving Copas., MD;  Location: [place];  Service: Gastroenterology;;  . BILIARY STENT PLACEMENT  04/01/2018   Procedure: BILIARY STENT  PLACEMENT;  Surgeon: Irving Copas., MD;  Location: [place];  Service: Gastroenterology;;  . BILIARY STENT PLACEMENT  09/12/2018   Procedure: BILIARY STENT PLACEMENT;  Surgeon: Irving Copas., MD;  Location: [place];  Service: Gastroenterology;;  . BILIARY STENT PLACEMENT  11/28/2018   Procedure: BILIARY STENT PLACEMENT;  Surgeon: Irving Copas., MD;  Location: [place];  Service: Gastroenterology;;  . BIOPSY  04/01/2018   Procedure: BIOPSY;  Surgeon: Irving Copas., MD;  Location: [place];  Service: Gastroenterology;;  . BIOPSY  09/12/2018   Procedure: BIOPSY;  Surgeon: Irving Copas., MD;  Location: [place];  Service: Gastroenterology;;  . BIOPSY  11/28/2018   Procedure: BIOPSY;  Surgeon: Irving Copas., MD;  Location: [facility] ENDOSCOPY;  Service: Gastroenterology;;  . bladder cancer      x 8 cystoscopy  . CERVICAL DISCECTOMY     ACDF  . COLONOSCOPY  11/17/2005   normal   . CORONARY ARTERY BYPASS GRAFT     x4  . CORONARY STENT INTERVENTION N/A 01/05/2019   Procedure: CORONARY STENT INTERVENTION;  Surgeon: Nelva Bush, MD;  Location: [place] [facility];  Service: Cardiovascular;  Laterality: N/A;  . ENDOSCOPIC MUCOSAL RESECTION  09/12/2018   Procedure: ENDOSCOPIC MUCOSAL RESECTION;  Surgeon: Rush Landmark, Telford Nab., MD;  Location: [facility] ENDOSCOPY;  Service: Gastroenterology;;  . ENDOSCOPIC RETROGRADE CHOLANGIOPANCREATOGRAPHY (ERCP) WITH PROPOFOL N/A 04/01/2018   Procedure: ENDOSCOPIC RETROGRADE CHOLANGIOPANCREATOGRAPHY (ERCP) WITH PROPOFOL;  Surgeon: Irving Copas., MD;  Location: [place];  Service:  Gastroenterology;  Laterality: N/A;  . ENDOSCOPIC RETROGRADE CHOLANGIOPANCREATOGRAPHY (ERCP) WITH PROPOFOL N/A 09/12/2018   Procedure: ENDOSCOPIC RETROGRADE CHOLANGIOPANCREATOGRAPHY (ERCP) WITH PROPOFOL;  Surgeon: Rush Landmark Telford Nab., MD;  Location: [place];  Service: Gastroenterology;  Laterality: N/A;   . ERCP N/A 11/28/2018   Procedure: ENDOSCOPIC RETROGRADE CHOLANGIOPANCREATOGRAPHY (ERCP) +EGD with spyglass;  Surgeon: Rush Landmark Telford Nab., MD;  Location: [place];  Service: Gastroenterology;  Laterality: N/A;  . ESOPHAGOGASTRODUODENOSCOPY  04/29/2010  . ESOPHAGOGASTRODUODENOSCOPY (EGD) WITH PROPOFOL N/A 04/01/2018   Procedure: ESOPHAGOGASTRODUODENOSCOPY (EGD) WITH PROPOFOL;  Surgeon: Rush Landmark Telford Nab., MD;  Location: [place];  Service: Gastroenterology;  Laterality: N/A;  . ESOPHAGOGASTRODUODENOSCOPY (EGD) WITH PROPOFOL N/A 09/12/2018   Procedure: ESOPHAGOGASTRODUODENOSCOPY (EGD) WITH PROPOFOL;  Surgeon: Rush Landmark Telford Nab., MD;  Location: [place];  Service: Gastroenterology;  Laterality: N/A;  . ESOPHAGOGASTRODUODENOSCOPY (EGD) WITH PROPOFOL N/A 11/28/2018   Procedure: ESOPHAGOGASTRODUODENOSCOPY (EGD) WITH PROPOFOL;  Surgeon: Rush Landmark Telford Nab., MD;  Location: [place];  Service: Gastroenterology;  Laterality: N/A;  . EUS  04/01/2018   Procedure: FULL UPPER ENDOSCOPIC ULTRASOUND (EUS) RADIAL;  Surgeon: Irving Copas., MD;  Location: [place];  Service: Gastroenterology;;  . EUS N/A 09/12/2018   Procedure: UPPER ENDOSCOPIC ULTRASOUND (EUS) RADIAL;  Surgeon: Irving Copas., MD;  Location: [place];  Service: Gastroenterology;  Laterality: N/A;  . FINE NEEDLE ASPIRATION  09/12/2018   Procedure: FINE NEEDLE ASPIRATION (FNA) LINEAR;  Surgeon: Irving Copas., MD;  Location: [place];  Service: Gastroenterology;;  . HEMOSTASIS CLIP PLACEMENT  09/12/2018   Procedure: HEMOSTASIS CLIP PLACEMENT;  Surgeon: Irving Copas., MD;  Location: [facility] ENDOSCOPY;  Service: Gastroenterology;;  . I & D EXTREMITY Left 11/24/2016   Procedure: IRRIGATION AND DEBRIDEMENT LEFT HAND, THUMB, INDEX, MIDDLE, RING, AND SMALL FINGERS WITH RECONSTRUCTION;  Surgeon: Roseanne Kaufman, MD;  Location: [place];  Service: Orthopedics;  Laterality: Left;  . KNEE  ARTHROSCOPY Left   . LEFT HEART CATH AND CORS/GRAFTS ANGIOGRAPHY N/A 01/05/2019   Procedure: LEFT HEART CATH AND CORS/GRAFTS ANGIOGRAPHY;  Surgeon: Nelva Bush, MD;  Location: [place] [facility];  Service: Cardiovascular;  Laterality: N/A;  . LUMBAR LAMINECTOMY     and fusion x 2  . NASAL SINUS SURGERY    . POPLITEAL SYNOVIAL CYST EXCISION    . REMOVAL OF STONES  04/01/2018   Procedure: REMOVAL OF STONES;  Surgeon: Rush Landmark Telford Nab., MD;  Location: [place];  Service: Gastroenterology;;  . REMOVAL OF STONES  09/12/2018   Procedure: REMOVAL OF STONES;  Surgeon: Irving Copas., MD;  Location: [place];  Service: Gastroenterology;;  . REMOVAL OF STONES  11/28/2018   Procedure: REMOVAL OF STONES;  Surgeon: Irving Copas., MD;  Location: [place];  Service: Gastroenterology;;  . Azzie Almas DILATION N/A 09/12/2018   Procedure: Azzie Almas DILATION;  Surgeon: Irving Copas., MD;  Location: [place];  Service: Gastroenterology;  Laterality: N/A;  . SAVORY DILATION N/A 11/28/2018   Procedure: SAVORY DILATION;  Surgeon: Rush Landmark Telford Nab., MD;  Location: [place];  Service: Gastroenterology;  Laterality: N/A;  . SPHINCTEROTOMY  04/01/2018   Procedure: SPHINCTEROTOMY;  Surgeon: Rush Landmark Telford Nab., MD;  Location: [employer];  Service: Gastroenterology;;  . Bess Kinds CHOLANGIOSCOPY N/A 11/28/2018   Procedure: [id number] CHOLANGIOSCOPY;  Surgeon: Irving Copas., MD;  Location: [place];  Service: Gastroenterology;  Laterality: N/A;  . STENT REMOVAL  09/12/2018   Procedure: STENT REMOVAL;  Surgeon: Irving Copas., MD;  Location: [place];  Service: Gastroenterology;;  . Lavell Islam REMOVAL  11/28/2018  Procedure: STENT REMOVAL;  Surgeon: Lemar Lofty., MD;  Location: [facility] ENDOSCOPY;  Service: Gastroenterology;;  . Sunnie Nielsen LIFTING INJECTION  09/12/2018   Procedure: SUBMUCOSAL LIFTING INJECTION;  Surgeon: Lemar Lofty., MD;  Location: [facility] ENDOSCOPY;  Service: Gastroenterology;;    Allergies  Allergies  Allergen Reactions  . Losartan Potassium Other (See Comments)    Hyperkalemia  . Tape Itching and Rash    reddened skin     History of Present Illness    Mr. Venezia has a PMH of coronary artery disease status post CABG 2001 LIMA-LAD, SVG to marginal, SVG to RCA.  Cardiac catheterization 2004 showed patent grafts.  Echocardiogram 8/12 showed EF 40-45% with moderate hypokinesis, G1 DD, aortic sclerosis.  Nuclear stress test 11/16 showed an EF of 54%, normal perfusion.  Echocardiogram 12/19 showed an EF of 60-65%, G1 DD, mild aortic valve stenosis.  Nuclear stress test 12/19 showed low risk and normal perfusion.  Spring 2020 he had abdominal discomfort which ended up being cholangeitis and E. coli sepsis.  During his hospitalization for cholecystectomy he had a NSTEMI.  His repeat cardiac catheterization December 2020 showed 99% subtotal occlusion SVG-RPDA/RPL.  He was treated with 3 overlapping DES.  He was continued on his aspirin Plavix.  Echocardiogram showed an EF of 40-45%, severe hypokinesis of inferior and anterior septal wall, G1 DD, mild MR.  He presented to his PCP on 11/09/2019.  He complained of progressive dyspnea on exertion.  He was not able to walk from his house to his car without stopping to take several breaks.  He denied chest pain.  He felt like his legs were significantly swollen.  He indicated when he laid down he felt like someone was smothering below.  Case was discussed with Dr. Duke Salvia.  He was started on Lasix 40 mg twice daily.  He was continued on his Bystolic, Imdur, aspirin, Plavix, statin.  He presented to the clinic 11/13/19 for follow-up evaluation and stated he was breathing much better today.  He stated that he had a significant amount of swelling in his lower extremities.  He had lower extremity support stockings but had not been wearing them.  He indicated that he  stayed away from using extra salt in his food.  However, he did not look for low-sodium options.  He was not taking his furosemide twice daily.  He was taking it daily.  He stated that prior to the event he was taking furosemide 1 time per week.  I will continued his furosemide daily, repeated BMP, planned follow-up in 3 months, give salty 6, give [place] support stockings sheet, and had him weigh himself daily.  His BMP was stable.  He presents the clinic today for follow-up evaluation states he feels weak.  He has had another 4 pound weight loss and he is also having orthopedic type problems.  He is working with GI and would like to have his gallbladder stents removed.  However due to his stenting in March he will been unable to have the stents removed until that time.  He denies any chest pain, increased work of breathing, and cardiac problems.  He does notice that his activity tolerance is somewhat less however he feels this is mainly limited due to his orthopedic problems.  We discussed continuing his antiplatelet therapy until mid March and then allowing him to pause for upcoming surgery/procedures.  I will have him increase his physical activity as tolerated, increase his calorie intake, and follow-up in 3 to  4 months with Dr. Claiborne Billings.  Today he denies chest pain, shortness of breath, lower extremity edema, fatigue, palpitations, melena, hematuria, hemoptysis, diaphoresis, weakness, presyncope, syncope, orthopnea, and PND.  Home Medications    Prior to Admission medications   Medication Sig Start Date End Date Taking? Authorizing Provider  acetaminophen (TYLENOL) 325 MG tablet Take 650 mg by mouth every 6 (six) hours as needed for mild pain.    [provider]  amLODipine (NORVASC) 2.5 MG tablet TAKE 2 TABLETS BY MOUTH DAILY 09/26/19   Troy Sine, MD  aspirin EC 81 MG tablet Take 81 mg by mouth at bedtime.    [provider]  BYSTOLIC 10 MG tablet Take 1 tablet by mouth  once daily 08/03/19   Troy Sine, MD  clopidogrel (PLAVIX) 75 MG tablet Take 1 tablet (75 mg total) by mouth daily. 04/11/19   Troy Sine, MD  cyanocobalamin (,VITAMIN B-12,) 1000 MCG/ML injection INJECT 1ML INTO THE MUSCLE FOR 1 DOSE 01/17/19   Isaac Bliss, Rayford Halsted, MD  dicyclomine (BENTYL) 10 MG capsule Take 1 capsule (10 mg total) by mouth 4 (four) times daily -  before meals and at bedtime. 01/19/19   Mansouraty, Telford Nab., MD  doxycycline (VIBRAMYCIN) 100 MG capsule Take 1 capsule (100 mg total) by mouth 2 (two) times daily. 05/08/19   Burchette, Alinda Sierras, MD  ezetimibe (ZETIA) 10 MG tablet Take 1 tablet (10 mg total) by mouth daily. 01/16/20   Isaac Bliss, Rayford Halsted, MD  Fiber Complete TABS Take 1 each by mouth daily.     [provider]  fluticasone (FLONASE) 50 MCG/ACT nasal spray Place 1-2 sprays into both nostrils at bedtime as needed for allergies or rhinitis.    [provider]  furosemide (LASIX) 40 MG tablet Take 1 tablet (40 mg total) by mouth every other day. 11/22/19   Deberah Pelton, NP  isosorbide mononitrate (IMDUR) 60 MG 24 hr tablet Take 1 tablet by mouth once daily 05/08/19   Troy Sine, MD  loratadine (CLARITIN) 10 MG tablet Take 10 mg by mouth daily.    [provider]  Menthol, Topical Analgesic, (BENGAY EX) Apply 1 application topically daily as needed (pain).    [provider]  Multiple Vitamins-Minerals (MULTIVITAMIN WITH MINERALS) tablet Take 1 tablet by mouth daily.    [provider]  Naphazoline-Pheniramine (OPCON-A) 0.027-0.315 % SOLN Place 1 drop into both eyes daily as needed (itching eyes).    [provider]  NEEDLE, DISP, 25 G (B-D DISP NEEDLE 25GX1") 25G X 1" MISC Inject 1000 mcg into muscle once a month. 12/24/17   Isaac Bliss, Rayford Halsted, MD  nitroGLYCERIN (NITROSTAT) 0.4 MG SL tablet DISSOLVE ONE TABLET UNDER THE TONGUE EVERY 5 MINUTES AS NEEDED FOR CHEST PAIN. 12/12/19    Isaac Bliss, Rayford Halsted, MD  omeprazole (PRILOSEC) 40 MG capsule Take 1 capsule by mouth once daily Patient taking differently: 20 mg.  10/30/19   Mansouraty, Telford Nab., MD  oxyCODONE-acetaminophen (PERCOCET) 10-325 MG tablet Take 1 tablet by mouth 2 (two) times daily as needed for pain. 12/07/19   Isaac Bliss, Rayford Halsted, MD  oxyCODONE-acetaminophen (PERCOCET) 10-325 MG tablet Take 1 tablet by mouth 2 (two) times daily as needed for pain. 12/07/19   Isaac Bliss, Rayford Halsted, MD  oxyCODONE-acetaminophen (PERCOCET) 10-325 MG tablet Take 1 tablet by mouth 2 (two) times daily as needed for pain. 12/07/19   Isaac Bliss, Rayford Halsted, MD  Probiotic Product (PROBIOTIC  ADVANCED PO) Take 1 tablet by mouth 2 (two) times daily.     [provider]  rOPINIRole (REQUIP) 2 MG tablet TAKE 1 TABLET BY MOUTH AT BEDTIME 10/30/19   Isaac Bliss, Rayford Halsted, MD  rosuvastatin (CRESTOR) 10 MG tablet Take 1 tablet (10 mg total) by mouth daily. 07/13/19   Troy Sine, MD  saccharomyces boulardii (FLORASTOR) 250 MG capsule Take 1 capsule (250 mg total) by mouth 2 (two) times daily. 01/17/19   Isaac Bliss, Rayford Halsted, MD  venlafaxine [facility]) 75 MG tablet Take 1 tablet by mouth once daily 01/16/20   Isaac Bliss, Rayford Halsted, MD    Family History    Family History  Problem Relation Age of Onset  . Melanoma Mother   . Stroke Father   . Hypertension Father   . Coronary artery disease Other   . Colon cancer Neg Hx   . Esophageal cancer Neg Hx   . Stomach cancer Neg Hx   . Rectal cancer Neg Hx   . Pancreatic cancer Neg Hx   . Liver disease Neg Hx   . Inflammatory bowel disease Neg Hx    He indicated that his mother is deceased. He indicated that his father is deceased. He indicated that the status of his neg hx is unknown. He indicated that the status of his other is unknown.  Social History    Social History   Socioeconomic History  . Marital status: Married    Spouse name: Not  on file  . Number of children: Not on file  . Years of education: Not on file  . Highest education level: Not on file  Occupational History  . Not on file  Tobacco Use  . Smoking status: Former Smoker    Packs/day: 0.50    Years: 5.00    Pack years: 2.50    Types: Cigarettes    Quit date: 03/30/1976    Years since quitting: 43.8  . Smokeless tobacco: Never Used  Vaping Use  . Vaping Use: Never used  Substance and Sexual Activity  . Alcohol use: No    Alcohol/week: 0.0 standard drinks  . Drug use: No  . Sexual activity: Yes  Other Topics Concern  . Not on file  Social History Narrative  . Not on file   Social Determinants of Health   Financial Resource Strain: Low Risk   . Difficulty of Paying Living Expenses: Not hard at all  Food Insecurity: Not on file  Transportation Needs: No Transportation Needs  . Lack of Transportation (Medical): No  . Lack of Transportation (Non-Medical): No  Physical Activity: Not on file  Stress: Not on file  Social Connections: Not on file  Intimate Partner Violence: Not on file     Review of Systems    General:  No chills, fever, night sweats or weight changes.  Cardiovascular:  No chest pain, dyspnea on exertion, edema, orthopnea, palpitations, paroxysmal nocturnal dyspnea. Dermatological: No rash, lesions/masses Respiratory: No cough, dyspnea Urologic: No hematuria, dysuria Abdominal:   No nausea, vomiting, diarrhea, bright red blood per rectum, melena, or hematemesis Neurologic:  No visual changes, wkns, changes in mental status. All other systems reviewed and are otherwise negative except as noted above.  Physical Exam    VS:  BP (!) 148/62   Pulse 62   Ht 5\' 9"  (1.753 m)   Wt 171 lb 4.8 oz (77.7 kg)   SpO2 95%   BMI 25.30 kg/m  , BMI Body mass index  is 25.3 kg/m. GEN: Well nourished, well developed, in no acute distress. HEENT: normal. Neck: Supple, no JVD, carotid bruits, or masses. Cardiac: RRR, no murmurs, rubs, or  gallops. No clubbing, cyanosis, edema.  Radials/DP/PT 2+ and equal bilaterally.  Respiratory:  Respirations regular and unlabored, clear to auscultation bilaterally. GI: Soft, nontender, nondistended, BS + x 4. MS: no deformity or atrophy. Skin: warm and dry, no rash. Neuro:  Strength and sensation are intact. Psych: Normal affect.  Accessory Clinical Findings    Recent Labs: 05/10/2019: BNP 190.9 07/20/2019: TSH 3.040 11/09/2019: ALT 8; Hemoglobin 9.7; Platelets 172 11/13/2019: BUN 23; Creatinine, Ser 1.62; Potassium 4.4; Sodium 145   Recent Lipid Panel    Component Value Date/Time   CHOL 113 07/20/2019 0850   CHOL 116 03/30/2013 1053   TRIG 145 07/20/2019 0850   TRIG 80 03/30/2013 1053   HDL 32 (L) 07/20/2019 0850   HDL 34 (L) 03/30/2013 1053   CHOLHDL 3.5 07/20/2019 0850   CHOLHDL 2.5 03/31/2018 0508   VLDL 14 03/31/2018 0508   LDLCALC 56 07/20/2019 0850   LDLCALC 66 03/30/2013 1053   LDLDIRECT 90.3 04/17/2008 0911    ECG personally reviewed by me today-none today.- No acute changes  Echocardiogram 04/10/2019 IMPRESSIONS    1. Left ventricular ejection fraction, by estimation, is 50 to 55%. The  left ventricle has low normal function. The left ventricle has no regional  wall motion abnormalities. Left ventricular diastolic parameters are  consistent with Grade II diastolic  dysfunction (pseudonormalization).  2. Right ventricular systolic function is normal. The right ventricular  size is normal. There is moderately elevated pulmonary artery systolic  pressure.  3. Left atrial size was moderately dilated.  4. The mitral valve is normal in structure. Trivial mitral valve  regurgitation. No evidence of mitral stenosis.  5. The aortic valve is grossly normal. Aortic valve regurgitation is  trivial. No aortic stenosis is present.   Cardiac catheterization 01/05/2019 1. Severe native coronary artery disease, including 75% ostial LMCA stenosis, 50% mid LAD  disease, sequential 90% and 70% proximal/mid LCx stenoses as well as 70% mid OM1 lesion, and sequential 70% ostial, 80% proximal, and 95% distal RCA stenoses. 2. Widely patent LIMA-LAD. 3. Chronically occluded SVG-OM1. 4. Subtotally occluded SVG-rPDA-rPL with 99% mid graft stenosis followed by extensive thrombus, some of which has embolized into the distal branches. 5. Upper normal left ventricular filling pressure. 6. Successful PCI to SVG-rPDA-rPL using overlapping Resolute Onyx 3.0 x 18 mm, 3.0 x 38 mm, and 3.0 x 38 mm drug-eluting stents with 0% residual stenosis and TIMI-3 flow. Small amount of nonocclusive thrombus remained in the rPL branch at the end of the procedure.  Recommendations: 1. Indefinite dual antiplatelet therapy with aspirin and clopidogrel, as tolerated. 2. Medical therapy of protected LMCA and LCx disease. If the patient has refractory symptoms, complex PCI of the ostial LMCA, LCx, and OM1 would need to be considered. 3. Aggressive secondary prevention. 4. Gentle post-catheterization hydration with close monitoring of renal function and hemoglobin, given contrast exposure and glycoprotein IIb/IIIa use during the intervention.  Diagnostic Dominance: Right  Intervention     Assessment & Plan   1.  Acute on chronic diastolic CHF-no increased DOE.  Had presented to PCP 11/09/2019 with complaints of increased DOE and lower extremity swelling.  Weight today 171.3.  Was 180.8 pounds with weight at PCP clinic. Continue furosemide, Bystolic, Heart healthy low-sodium diet-salty 6 given 1800 mg sodium restriction Increase physical activity as tolerated Continue Daily  weights-contact office with weight increase of 3 pounds overnight or 5 pounds in 1 week Continue lower extremity support stockings Elevate lower extremities when not active   Coronary artery disease-denies chest pain.  Had non-STEMI and underwent cardiac catheterization 01/05/2019 received DES x3 to his  SVG-RPDA/RPL graft Continue amlodipine, Bystolic, clopidogrel, Zetia, Imdur, nitroglycerin, rosuvastatin Heart healthy low-sodium diet-salty 6 given Maintain physical activity as tolerated  Essential hypertension-BP today 148/62.  Well-controlled at home Continue Bystolic, amlodipine, Imdur Heart healthy low-sodium diet-salty 6 given Increase physical activity as tolerated  Hyperlipidemia-LDL 56 on 07/20/2019 Continue Zetia, rosuvastatin Heart healthy low-sodium high-fiber diet Increase physical activity as tolerated  Disposition: Follow-up with Dr. Claiborne Billings in 3-4 months.   Jossie Ng. Mammie Meras NP-C    01/26/2020, 9:51 AM [address] Office [phone] Fax [phone]  Notice: This dictation was prepared with Dragon dictation along with smaller phrase technology. Any transcriptional errors that result from this process are unintentional and may not be corrected upon review.

## 2020-01-25 ENCOUNTER — Other Ambulatory Visit: Payer: Self-pay | Admitting: Internal Medicine

## 2020-01-25 DIAGNOSIS — E538 Deficiency of other specified B group vitamins: Secondary | ICD-10-CM

## 2020-01-26 ENCOUNTER — Other Ambulatory Visit: Payer: Self-pay

## 2020-01-26 ENCOUNTER — Encounter: Payer: Self-pay | Admitting: General Practice

## 2020-01-26 ENCOUNTER — Ambulatory Visit: Payer: Medicare Other | Admitting: General Practice

## 2020-01-26 VITALS — BP 148/62 | HR 62 | Ht 69.0 in | Wt 171.3 lb

## 2020-01-26 DIAGNOSIS — I25709 Atherosclerosis of coronary artery bypass graft(s), unspecified, with unspecified angina pectoris: Secondary | ICD-10-CM

## 2020-01-26 DIAGNOSIS — E785 Hyperlipidemia, unspecified: Secondary | ICD-10-CM

## 2020-01-26 DIAGNOSIS — I10 Essential (primary) hypertension: Secondary | ICD-10-CM | POA: Diagnosis not present

## 2020-01-26 DIAGNOSIS — I5033 Acute on chronic diastolic (congestive) heart failure: Secondary | ICD-10-CM

## 2020-01-26 NOTE — Patient Instructions (Signed)
Medication Instructions:  The current medical regimen is effective;  continue present plan and medications as directed. Please refer to the Current Medication list given to you today.  *If you need a refill on your cardiac medications before your next appointment, please call your pharmacy*  Lab Work:   Testing/Procedures:  NONE    NONE  Special Instructions INCREASE CALORIES IN YOUR DIET  PLEASE READ AND FOLLOW SALTY 6-ATTACHED-1,800mg  daily  CONTINUE DAILY WEIGHTS  PLEASE INCREASE PHYSICAL ACTIVITY AS TOLERATED  Follow-Up: Your next appointment:  3-4 month(s) In Person with [name], MD OR IF UNAVAILABLE [place], FNP-C  At [facility], you and your health needs are our priority.  As part of our continuing mission to provide you with exceptional heart care, we have created designated Provider Care Teams.  These Care Teams include your primary Cardiologist (physician) and Advanced Practice Providers (APPs -  Physician Assistants and Nurse Practitioners) who all work together to provide you with the care you need, when you need it.            6 SALTY THINGS TO AVOID     1,800MG  DAILY

## 2020-01-29 ENCOUNTER — Telehealth: Payer: Self-pay | Admitting: Gastroenterology

## 2020-01-29 DIAGNOSIS — R3915 Urgency of urination: Secondary | ICD-10-CM | POA: Diagnosis not present

## 2020-01-29 DIAGNOSIS — N35012 Post-traumatic membranous urethral stricture: Secondary | ICD-10-CM | POA: Diagnosis not present

## 2020-01-29 DIAGNOSIS — R3912 Poor urinary stream: Secondary | ICD-10-CM | POA: Diagnosis not present

## 2020-01-29 DIAGNOSIS — Z8551 Personal history of malignant neoplasm of bladder: Secondary | ICD-10-CM | POA: Diagnosis not present

## 2020-01-29 NOTE — Telephone Encounter (Signed)
Pt's daughter is requesting a call back from a nurse to discuss a procedure he needs regarding some stents that he has, caller would like to discuss further with the nurse.

## 2020-01-29 NOTE — Telephone Encounter (Signed)
Left message on machine to call back  

## 2020-01-31 ENCOUNTER — Telehealth: Payer: Self-pay | Admitting: Gastroenterology

## 2020-01-31 ENCOUNTER — Other Ambulatory Visit: Payer: Self-pay

## 2020-01-31 ENCOUNTER — Telehealth: Payer: Self-pay

## 2020-01-31 DIAGNOSIS — K831 Obstruction of bile duct: Secondary | ICD-10-CM

## 2020-01-31 NOTE — Telephone Encounter (Signed)
ERCP scheduled for 2/21 at 12 pm with Dr Rush Landmark at [facility].  COVID scheduled for 03/07/20 at 1010 am

## 2020-01-31 NOTE — Telephone Encounter (Signed)
Left message on machine to call back  

## 2020-01-31 NOTE — Telephone Encounter (Signed)
[facility] Pre-operative Risk Assessment     Request for surgical clearance:     Endoscopy Procedure  What type of surgery is being performed?     ERCP   When is this surgery scheduled?     03/11/20  What type of clearance is required ?   Pharmacy  Are there any medications that need to be held prior to surgery and how long? Plavix  Practice name and name of physician performing surgery?      [facility]  What is your office phone and fax number?      Phone- [phone]  [phone]  Anesthesia type (None, local, MAC, general) ?       MAC

## 2020-01-31 NOTE — Telephone Encounter (Signed)
Dr. Claiborne Billings to review, patient was just seen last week to follow-up on lower extremity edema, he was doing well at the time and the lower extremity edema has significantly improved.  Patient also has a history of CAD, chronic diastolic heart failure, COPD, hypertension, hyperlipidemia and restless leg syndrome.  Last PCI was in December 2020.  Dr. Claiborne Billings, I okay with him holding Plavix for 5 to 7 days prior to ERCP  Please send your response to P CV DIV PREOP

## 2020-01-31 NOTE — Telephone Encounter (Signed)
See alternate note 1/12

## 2020-02-01 NOTE — Telephone Encounter (Signed)
Ok to hold plavix 5 days prior to the procedure. See cardiology note   ERCP scheduled, pt instructed and medications reviewed.  Patient instructions mailed to home.  Patient to call with any questions or concerns.

## 2020-02-01 NOTE — Telephone Encounter (Signed)
Noted pt will be advised

## 2020-02-01 NOTE — Telephone Encounter (Signed)
Okay to hold Plavix 5 days preop  Glenetta Hew, MD

## 2020-02-04 ENCOUNTER — Other Ambulatory Visit: Payer: Self-pay | Admitting: Internal Medicine

## 2020-02-04 ENCOUNTER — Other Ambulatory Visit: Payer: Self-pay | Admitting: Gastroenterology

## 2020-02-19 ENCOUNTER — Telehealth: Payer: Self-pay | Admitting: Internal Medicine

## 2020-02-19 NOTE — Telephone Encounter (Signed)
Pt call and stated he need a refill on  oxyCODONE-acetaminophen (PERCOCET) 10-325 MG tablet sent to  [facility], [place] [address] Phone:  [phone]  Fax:  [phone]

## 2020-02-20 NOTE — Telephone Encounter (Signed)
Left detailed message on machine for patient to schedule an office visit or virtual visit for further refills.

## 2020-02-27 ENCOUNTER — Telehealth: Payer: Self-pay | Admitting: Gastroenterology

## 2020-02-27 NOTE — Telephone Encounter (Signed)
Pt's son is requesting to change the pt's covid test time to after lunch.

## 2020-02-27 NOTE — Telephone Encounter (Addendum)
Covid test appointment time has been changed to 03/07/20 @ 1:15pm. Tried returning call, unable to leave message. Cory Roughen, I didn't schedule the patient, Patty did. I still changed the appt time for the patient. I was not able to reach son because there is no number listed for the son. I tried home phone listed and [name], but no answer.

## 2020-02-27 NOTE — Telephone Encounter (Signed)
Appointment scheduled.

## 2020-02-29 ENCOUNTER — Other Ambulatory Visit: Payer: Self-pay

## 2020-02-29 ENCOUNTER — Encounter: Payer: Self-pay | Admitting: Internal Medicine

## 2020-02-29 ENCOUNTER — Ambulatory Visit (INDEPENDENT_AMBULATORY_CARE_PROVIDER_SITE_OTHER): Payer: Medicare Other | Admitting: Internal Medicine

## 2020-02-29 VITALS — BP 130/76 | HR 56 | Temp 98.2°F | Wt 175.1 lb

## 2020-02-29 DIAGNOSIS — I5032 Chronic diastolic (congestive) heart failure: Secondary | ICD-10-CM | POA: Diagnosis not present

## 2020-02-29 DIAGNOSIS — G8929 Other chronic pain: Secondary | ICD-10-CM

## 2020-02-29 DIAGNOSIS — M545 Low back pain, unspecified: Secondary | ICD-10-CM

## 2020-02-29 DIAGNOSIS — K831 Obstruction of bile duct: Secondary | ICD-10-CM

## 2020-02-29 DIAGNOSIS — E119 Type 2 diabetes mellitus without complications: Secondary | ICD-10-CM

## 2020-02-29 LAB — POCT GLYCOSYLATED HEMOGLOBIN (HGB A1C): Hemoglobin A1C: 5.9 % — AB (ref 4.0–5.6)

## 2020-02-29 MED ORDER — OXYCODONE-ACETAMINOPHEN 10-325 MG PO TABS: 1.0000 | ORAL_TABLET | Freq: Two times a day (BID) | ORAL | 0 refills | Status: AC | PRN

## 2020-02-29 MED ORDER — OXYCODONE-ACETAMINOPHEN 10-325 MG PO TABS: 1.0000 | ORAL_TABLET | Freq: Two times a day (BID) | ORAL | 0 refills | Status: DC | PRN

## 2020-02-29 NOTE — Patient Instructions (Signed)
-  Nice seeing you today!!  -Schedule follow up in 3 months. 

## 2020-02-29 NOTE — Progress Notes (Signed)
Established Patient Office Visit     This visit occurred during the SARS-CoV-2 public health emergency.  Safety protocols were in place, including screening questions prior to the visit, additional usage of staff PPE, and extensive cleaning of exam room while observing appropriate contact time as indicated for disinfecting solutions.    CC/Reason for Visit: Follow-up chronic conditions  HPI: [name] is a 80 y.o. male who is coming in today for the above mentioned reasons.  He is here today for follow-up on his diabetes, congestive heart failure as well as opioid refills per contract.  He takes oxycodone 10/325 mg twice daily for total of 60 tablets a month.  He has been doing this for years.  He tells me he has an ERCP at the end of the month to remove his biliary stents.  He has a history of ascending cholangitis and pancreatitis due to biliary strictures.  In October we dealt with an episode of acute on chronic diastolic congestive heart failure.  His Lasix dose was increased and he continues to follow-up routinely with cardiology.  He tells me he feels much improved.   Past Medical/Surgical History: Past Medical History:  Diagnosis Date  . Allergy   . B12 deficiency anemia   . Blood transfusion without reported diagnosis   . CAD (coronary artery disease)   . Cancer ([place])    bladder-   . Colon polyps   . COPD (chronic obstructive pulmonary disease) ([place])   . Depression   . Esophagus, Barrett's   . GERD (gastroesophageal reflux disease)   . History of bladder cancer    Bladder cancer "8 times"  . History of hiatal hernia   . Hyperlipidemia   . Hypertension   . Localized osteoarthrosis, lower leg   . Restless leg syndrome   . Sleep apnea    does not wear cpap  . Stenosis of esophagus     Past Surgical History:  Procedure Laterality Date  . BILIARY BRUSHING  04/01/2018   Procedure: BILIARY BRUSHING;  Surgeon: Rush Landmark Telford Nab., MD;  Location: [place];  Service: Gastroenterology;;  . BILIARY BRUSHING  09/12/2018   Procedure: BILIARY BRUSHING;  Surgeon: Irving Copas., MD;  Location: [place];  Service: Gastroenterology;;  . BILIARY BRUSHING  11/28/2018   Procedure: BILIARY BRUSHING;  Surgeon: Irving Copas., MD;  Location: [place];  Service: Gastroenterology;;  . BILIARY DILATION  09/12/2018   Procedure: BILIARY DILATION;  Surgeon: Irving Copas., MD;  Location: [place];  Service: Gastroenterology;;  . BILIARY DILATION  11/28/2018   Procedure: BILIARY DILATION;  Surgeon: Irving Copas., MD;  Location: [place];  Service: Gastroenterology;;  . BILIARY STENT PLACEMENT  04/01/2018   Procedure: BILIARY STENT PLACEMENT;  Surgeon: Irving Copas., MD;  Location: [place];  Service: Gastroenterology;;  . BILIARY STENT PLACEMENT  09/12/2018   Procedure: BILIARY STENT PLACEMENT;  Surgeon: Irving Copas., MD;  Location: [place];  Service: Gastroenterology;;  . BILIARY STENT PLACEMENT  11/28/2018   Procedure: BILIARY STENT PLACEMENT;  Surgeon: Irving Copas., MD;  Location: [place];  Service: Gastroenterology;;  . BIOPSY  04/01/2018   Procedure: BIOPSY;  Surgeon: Irving Copas., MD;  Location: [place];  Service: Gastroenterology;;  . BIOPSY  09/12/2018   Procedure: BIOPSY;  Surgeon: Irving Copas., MD;  Location: [place];  Service: Gastroenterology;;  . BIOPSY  11/28/2018   Procedure: BIOPSY;  Surgeon: Irving Copas., MD;  Location:  [facility];  Service: Gastroenterology;;  . bladder cancer      x 8 cystoscopy  . CERVICAL DISCECTOMY     ACDF  . COLONOSCOPY  11/17/2005   normal   . CORONARY ARTERY BYPASS GRAFT     x4  . CORONARY STENT INTERVENTION N/A 01/05/2019   Procedure: CORONARY STENT INTERVENTION;  Surgeon: Nelva Bush, MD;  Location: [place] [facility];  Service: Cardiovascular;  Laterality: N/A;  .  ENDOSCOPIC MUCOSAL RESECTION  09/12/2018   Procedure: ENDOSCOPIC MUCOSAL RESECTION;  Surgeon: Rush Landmark, Telford Nab., MD;  Location: [facility];  Service: Gastroenterology;;  . ENDOSCOPIC RETROGRADE CHOLANGIOPANCREATOGRAPHY (ERCP) WITH PROPOFOL N/A 04/01/2018   Procedure: ENDOSCOPIC RETROGRADE CHOLANGIOPANCREATOGRAPHY (ERCP) WITH PROPOFOL;  Surgeon: Irving Copas., MD;  Location: [place];  Service: Gastroenterology;  Laterality: N/A;  . ENDOSCOPIC RETROGRADE CHOLANGIOPANCREATOGRAPHY (ERCP) WITH PROPOFOL N/A 09/12/2018   Procedure: ENDOSCOPIC RETROGRADE CHOLANGIOPANCREATOGRAPHY (ERCP) WITH PROPOFOL;  Surgeon: Rush Landmark Telford Nab., MD;  Location: [place];  Service: Gastroenterology;  Laterality: N/A;  . ERCP N/A 11/28/2018   Procedure: ENDOSCOPIC RETROGRADE CHOLANGIOPANCREATOGRAPHY (ERCP) +EGD with spyglass;  Surgeon: Rush Landmark Telford Nab., MD;  Location: [place];  Service: Gastroenterology;  Laterality: N/A;  . ESOPHAGOGASTRODUODENOSCOPY  04/29/2010  . ESOPHAGOGASTRODUODENOSCOPY (EGD) WITH PROPOFOL N/A 04/01/2018   Procedure: ESOPHAGOGASTRODUODENOSCOPY (EGD) WITH PROPOFOL;  Surgeon: Rush Landmark Telford Nab., MD;  Location: [place];  Service: Gastroenterology;  Laterality: N/A;  . ESOPHAGOGASTRODUODENOSCOPY (EGD) WITH PROPOFOL N/A 09/12/2018   Procedure: ESOPHAGOGASTRODUODENOSCOPY (EGD) WITH PROPOFOL;  Surgeon: Rush Landmark Telford Nab., MD;  Location: [place];  Service: Gastroenterology;  Laterality: N/A;  . ESOPHAGOGASTRODUODENOSCOPY (EGD) WITH PROPOFOL N/A 11/28/2018   Procedure: ESOPHAGOGASTRODUODENOSCOPY (EGD) WITH PROPOFOL;  Surgeon: Rush Landmark Telford Nab., MD;  Location: [place];  Service: Gastroenterology;  Laterality: N/A;  . EUS  04/01/2018   Procedure: FULL UPPER ENDOSCOPIC ULTRASOUND (EUS) RADIAL;  Surgeon: Irving Copas., MD;  Location: [place];  Service: Gastroenterology;;  . EUS N/A 09/12/2018   Procedure: UPPER ENDOSCOPIC ULTRASOUND  (EUS) RADIAL;  Surgeon: Irving Copas., MD;  Location: [place];  Service: Gastroenterology;  Laterality: N/A;  . FINE NEEDLE ASPIRATION  09/12/2018   Procedure: FINE NEEDLE ASPIRATION (FNA) LINEAR;  Surgeon: Irving Copas., MD;  Location: [place];  Service: Gastroenterology;;  . HEMOSTASIS CLIP PLACEMENT  09/12/2018   Procedure: HEMOSTASIS CLIP PLACEMENT;  Surgeon: Irving Copas., MD;  Location: [facility]COPY;  Service: Gastroenterology;;  . I & D EXTREMITY Left 11/24/2016   Procedure: IRRIGATION AND DEBRIDEMENT LEFT HAND, THUMB, INDEX, MIDDLE, RING, AND SMALL FINGERS WITH RECONSTRUCTION;  Surgeon: Roseanne Kaufman, MD;  Location: [place];  Service: Orthopedics;  Laterality: Left;  . KNEE ARTHROSCOPY Left   . LEFT HEART CATH AND CORS/GRAFTS ANGIOGRAPHY N/A 01/05/2019   Procedure: LEFT HEART CATH AND CORS/GRAFTS ANGIOGRAPHY;  Surgeon: Nelva Bush, MD;  Location: [place] [facility];  Service: Cardiovascular;  Laterality: N/A;  . LUMBAR LAMINECTOMY     and fusion x 2  . NASAL SINUS SURGERY    . POPLITEAL SYNOVIAL CYST EXCISION    . REMOVAL OF STONES  04/01/2018   Procedure: REMOVAL OF STONES;  Surgeon: Rush Landmark Telford Nab., MD;  Location: [place];  Service: Gastroenterology;;  . REMOVAL OF STONES  09/12/2018   Procedure: REMOVAL OF STONES;  Surgeon: Irving Copas., MD;  Location: [place];  Service: Gastroenterology;;  . REMOVAL OF STONES  11/28/2018   Procedure: REMOVAL OF STONES;  Surgeon: Irving Copas., MD;  Location: [place];  Service: Gastroenterology;;  .  SAVORY DILATION N/A 09/12/2018   Procedure: SAVORY DILATION;  Surgeon: Rush Landmark Telford Nab., MD;  Location: [place];  Service: Gastroenterology;  Laterality: N/A;  . SAVORY DILATION N/A 11/28/2018   Procedure: SAVORY DILATION;  Surgeon: Rush Landmark Telford Nab., MD;  Location: [place];  Service: Gastroenterology;  Laterality: N/A;  . SPHINCTEROTOMY  04/01/2018    Procedure: SPHINCTEROTOMY;  Surgeon: Rush Landmark Telford Nab., MD;  Location: [place];  Service: Gastroenterology;;  . Bess Kinds CHOLANGIOSCOPY N/A 11/28/2018   Procedure: STMHDQQI CHOLANGIOSCOPY;  Surgeon: Irving Copas., MD;  Location: [place];  Service: Gastroenterology;  Laterality: N/A;  . STENT REMOVAL  09/12/2018   Procedure: STENT REMOVAL;  Surgeon: Irving Copas., MD;  Location: [place];  Service: Gastroenterology;;  . Lavell Islam REMOVAL  11/28/2018   Procedure: STENT REMOVAL;  Surgeon: Irving Copas., MD;  Location: [place];  Service: Gastroenterology;;  . Lia Foyer LIFTING INJECTION  09/12/2018   Procedure: SUBMUCOSAL LIFTING INJECTION;  Surgeon: Irving Copas., MD;  Location: [place];  Service: Gastroenterology;;    Social History:  reports that he quit smoking about 43 years ago. His smoking use included cigarettes. He has a 2.50 pack-year smoking history. He has never used smokeless tobacco. He reports that he does not drink alcohol and does not use drugs.  Allergies: Allergies  Allergen Reactions  . Losartan Potassium Other (See Comments)    Hyperkalemia  . Tape Itching and Rash    reddened skin     Family History:  Family History  Problem Relation Age of Onset  . Melanoma Mother   . Stroke Father   . Hypertension Father   . Coronary artery disease Other   . Colon cancer Neg Hx   . Esophageal cancer Neg Hx   . Stomach cancer Neg Hx   . Rectal cancer Neg Hx   . Pancreatic cancer Neg Hx   . Liver disease Neg Hx   . Inflammatory bowel disease Neg Hx      Current Outpatient Medications:  .  acetaminophen (TYLENOL) 325 MG tablet, Take 650 mg by mouth every 6 (six) hours as needed for mild pain., Disp: , Rfl:  .  amLODipine (NORVASC) 2.5 MG tablet, TAKE 2 TABLETS BY MOUTH DAILY, Disp: 90 tablet, Rfl: 8 .  aspirin EC 81 MG tablet, Take 81 mg by mouth at bedtime., Disp: , Rfl:  .  BYSTOLIC 10 MG tablet, Take 1  tablet by mouth once daily, Disp: 90 tablet, Rfl: 3 .  clopidogrel (PLAVIX) 75 MG tablet, Take 1 tablet (75 mg total) by mouth daily., Disp: 90 tablet, Rfl: 3 .  cyanocobalamin (,VITAMIN B-12,) 1000 MCG/ML injection, INJECT 1ML INTO THE MUSCLE FOR 1 DOSE, Disp: 6 mL, Rfl: 0 .  ezetimibe (ZETIA) 10 MG tablet, Take 1 tablet (10 mg total) by mouth daily., Disp: 90 tablet, Rfl: 1 .  Fiber Complete TABS, Take 1 each by mouth daily. , Disp: , Rfl:  .  fluticasone (FLONASE) 50 MCG/ACT nasal spray, Place 1-2 sprays into both nostrils at bedtime as needed for allergies or rhinitis., Disp: , Rfl:  .  furosemide (LASIX) 40 MG tablet, Take 1 tablet (40 mg total) by mouth every other day., Disp: 90 tablet, Rfl: 1 .  isosorbide mononitrate (IMDUR) 60 MG 24 hr tablet, Take 1 tablet by mouth once daily, Disp: 90 tablet, Rfl: 3 .  loratadine (CLARITIN) 10 MG tablet, Take 10 mg by mouth daily., Disp: , Rfl:  .  Menthol, Topical Analgesic, (BENGAY EX), Apply  1 application topically daily as needed (pain)., Disp: , Rfl:  .  Multiple Vitamins-Minerals (MULTIVITAMIN WITH MINERALS) tablet, Take 1 tablet by mouth daily., Disp: , Rfl:  .  Naphazoline-Pheniramine (OPCON-A) 0.027-0.315 % SOLN, Place 1 drop into both eyes daily as needed (itching eyes)., Disp: , Rfl:  .  NEEDLE, DISP, 25 G (B-D DISP NEEDLE 25GX1") 25G X 1" MISC, Inject 1000 mcg into muscle once a month., Disp: 50 each, Rfl: 0 .  nitroGLYCERIN (NITROSTAT) 0.4 MG SL tablet, DISSOLVE ONE TABLET UNDER THE TONGUE EVERY 5 MINUTES AS NEEDED FOR CHEST PAIN., Disp: 25 tablet, Rfl: 0 .  omeprazole (PRILOSEC) 40 MG capsule, Take 1 capsule by mouth once daily, Disp: 30 capsule, Rfl: 0 .  oxyCODONE-acetaminophen (PERCOCET) 10-325 MG tablet, Take 1 tablet by mouth 2 (two) times daily as needed for up to 5 days for pain., Disp: 60 tablet, Rfl: 0 .  oxyCODONE-acetaminophen (PERCOCET) 10-325 MG tablet, Take 1 tablet by mouth 2 (two) times daily as needed for up to 5 days for  pain., Disp: 60 tablet, Rfl: 0 .  Probiotic Product (PROBIOTIC ADVANCED PO), Take 1 tablet by mouth 2 (two) times daily. , Disp: , Rfl:  .  rOPINIRole (REQUIP) 2 MG tablet, TAKE 1 TABLET BY MOUTH AT BEDTIME, Disp: 90 tablet, Rfl: 0 .  rosuvastatin (CRESTOR) 10 MG tablet, Take 1 tablet (10 mg total) by mouth daily., Disp: 90 tablet, Rfl: 3 .  saccharomyces boulardii (FLORASTOR) 250 MG capsule, Take 1 capsule (250 mg total) by mouth 2 (two) times daily., Disp: 60 capsule, Rfl: 2 .  venlafaxine (EFFEXOR) 75 MG tablet, Take 1 tablet by mouth once daily, Disp: 90 tablet, Rfl: 1 .  oxyCODONE-acetaminophen (PERCOCET) 10-325 MG tablet, Take 1 tablet by mouth 2 (two) times daily as needed for pain., Disp: 60 tablet, Rfl: 0  Review of Systems:  Constitutional: Denies fever, chills, diaphoresis, appetite change and fatigue.  HEENT: Denies photophobia, eye pain, redness, hearing loss, ear pain, congestion, sore throat, rhinorrhea, sneezing, mouth sores, trouble swallowing, neck pain, neck stiffness and tinnitus.   Respiratory: Denies SOB, DOE, cough, chest tightness,  and wheezing.   Cardiovascular: Denies chest pain, palpitations and leg swelling.  Gastrointestinal: Denies nausea, vomiting, diarrhea, constipation, blood in stool and abdominal distention.  Genitourinary: Denies dysuria, urgency, frequency, hematuria, flank pain and difficulty urinating.  Endocrine: Denies: hot or cold intolerance, sweats, changes in hair or nails, polyuria, polydipsia. Musculoskeletal: Denies myalgias, joint swelling, arthralgias and gait problem.  Skin: Denies pallor, rash and wound.  Neurological: Denies dizziness, seizures, syncope, weakness, light-headedness, numbness and headaches.  Hematological: Denies adenopathy. Easy bruising, personal or family bleeding history  Psychiatric/Behavioral: Denies suicidal ideation, mood changes, confusion, nervousness, sleep disturbance and agitation    Physical Exam: Vitals:    02/29/20 1051  BP: 130/76  Pulse: (!) 56  Temp: 98.2 F (36.8 C)  TempSrc: Oral  SpO2: 96%  Weight: 175 lb 1.6 oz (79.4 kg)    Body mass index is 25.86 kg/m.  Constitutional: NAD, calm, comfortable Eyes: PERRL, lids and conjunctivae normal ENMT: Mucous membranes are moist. Respiratory: clear to auscultation bilaterally, no wheezing, no crackles. Normal respiratory effort. No accessory muscle use.  Cardiovascular: Regular rate and rhythm, no murmurs / rubs / gallops. No extremity edema. Neurologic: Grossly intact and nonfocal Psychiatric: Normal judgment and insight. Alert and oriented x 3. Normal mood.    Impression and Plan:  Diabetes mellitus type 2, noninsulin dependent ([place])  -Well-controlled with an A1c of 5.9  today.  Chronic bilateral low back pain without sciatica -PDMP reviewed, no red flags, overdose risk score is 150. -Refill Percocet 10/325 mg to take 1 tablet twice daily for total of 60 tablets/month x 3 months.  Biliary stricture -He is going for ERCP at the end of the month for removal.  Chronic diastolic CHF (congestive heart failure) ([place]) -Currently stable and compensated.  Followed by cardiology closely.    Patient Instructions  -Nice seeing you today!!  -Schedule follow up in 3 months.     [name], MD [facility]

## 2020-03-03 ENCOUNTER — Other Ambulatory Visit: Payer: Self-pay | Admitting: Gastroenterology

## 2020-03-04 NOTE — Progress Notes (Signed)
Attempted to obtain medical history via telephone, unable to reach at this time. I left a voicemail to return pre surgical testing department's phone call.  

## 2020-03-05 ENCOUNTER — Other Ambulatory Visit: Payer: Self-pay

## 2020-03-07 ENCOUNTER — Other Ambulatory Visit (HOSPITAL_COMMUNITY)
Admission: RE | Admit: 2020-03-07 | Discharge: 2020-03-07 | Disposition: A | Discharge status: Home / Self Care | Payer: Medicare Other | Source: Ambulatory Visit | Attending: Gastroenterology | Admitting: Gastroenterology

## 2020-03-07 ENCOUNTER — Other Ambulatory Visit (HOSPITAL_COMMUNITY): Payer: Medicare Other

## 2020-03-07 DIAGNOSIS — Z01812 Encounter for preprocedural laboratory examination: Secondary | ICD-10-CM | POA: Insufficient documentation

## 2020-03-07 DIAGNOSIS — Z20822 Contact with and (suspected) exposure to covid-19: Secondary | ICD-10-CM | POA: Diagnosis not present

## 2020-03-07 LAB — SARS CORONAVIRUS 2 (TAT 6-24 HRS): SARS Coronavirus 2: NEGATIVE

## 2020-03-08 ENCOUNTER — Telehealth: Payer: Self-pay

## 2020-03-08 NOTE — Telephone Encounter (Signed)
Call placed to make the pt aware of the time change for his procedure on Monday.  He will need to arrive at [facility] at 9 am for a 1030 am case. I have also sent a My Chart message.

## 2020-03-11 ENCOUNTER — Encounter (HOSPITAL_COMMUNITY): Payer: Self-pay | Admitting: Gastroenterology

## 2020-03-11 ENCOUNTER — Ambulatory Visit (HOSPITAL_COMMUNITY): Payer: Medicare Other

## 2020-03-11 ENCOUNTER — Ambulatory Visit (HOSPITAL_COMMUNITY): Payer: Medicare Other | Admitting: Certified Registered"

## 2020-03-11 ENCOUNTER — Ambulatory Visit (HOSPITAL_COMMUNITY)
Admission: RE | Admit: 2020-03-11 | Discharge: 2020-03-11 | Disposition: A | Discharge status: Home / Self Care | Payer: Medicare Other | Attending: Gastroenterology | Admitting: Gastroenterology

## 2020-03-11 ENCOUNTER — Other Ambulatory Visit: Payer: Self-pay

## 2020-03-11 ENCOUNTER — Encounter (HOSPITAL_COMMUNITY)
Admission: RE | Disposition: A | Discharge status: Home / Self Care | Payer: Self-pay | Source: Home / Self Care | Attending: Gastroenterology

## 2020-03-11 DIAGNOSIS — Z8551 Personal history of malignant neoplasm of bladder: Secondary | ICD-10-CM | POA: Insufficient documentation

## 2020-03-11 DIAGNOSIS — K219 Gastro-esophageal reflux disease without esophagitis: Secondary | ICD-10-CM | POA: Insufficient documentation

## 2020-03-11 DIAGNOSIS — Z951 Presence of aortocoronary bypass graft: Secondary | ICD-10-CM | POA: Insufficient documentation

## 2020-03-11 DIAGNOSIS — J449 Chronic obstructive pulmonary disease, unspecified: Secondary | ICD-10-CM | POA: Insufficient documentation

## 2020-03-11 DIAGNOSIS — Z823 Family history of stroke: Secondary | ICD-10-CM | POA: Diagnosis not present

## 2020-03-11 DIAGNOSIS — Z809 Family history of malignant neoplasm, unspecified: Secondary | ICD-10-CM | POA: Diagnosis not present

## 2020-03-11 DIAGNOSIS — Z91048 Other nonmedicinal substance allergy status: Secondary | ICD-10-CM | POA: Insufficient documentation

## 2020-03-11 DIAGNOSIS — K2950 Unspecified chronic gastritis without bleeding: Secondary | ICD-10-CM | POA: Diagnosis not present

## 2020-03-11 DIAGNOSIS — K2289 Other specified disease of esophagus: Secondary | ICD-10-CM | POA: Insufficient documentation

## 2020-03-11 DIAGNOSIS — I2510 Atherosclerotic heart disease of native coronary artery without angina pectoris: Secondary | ICD-10-CM | POA: Diagnosis not present

## 2020-03-11 DIAGNOSIS — Z8249 Family history of ischemic heart disease and other diseases of the circulatory system: Secondary | ICD-10-CM | POA: Diagnosis not present

## 2020-03-11 DIAGNOSIS — K8051 Calculus of bile duct without cholangitis or cholecystitis with obstruction: Secondary | ICD-10-CM | POA: Diagnosis not present

## 2020-03-11 DIAGNOSIS — Z87891 Personal history of nicotine dependence: Secondary | ICD-10-CM | POA: Insufficient documentation

## 2020-03-11 DIAGNOSIS — K831 Obstruction of bile duct: Secondary | ICD-10-CM

## 2020-03-11 DIAGNOSIS — Z955 Presence of coronary angioplasty implant and graft: Secondary | ICD-10-CM | POA: Insufficient documentation

## 2020-03-11 DIAGNOSIS — Z888 Allergy status to other drugs, medicaments and biological substances status: Secondary | ICD-10-CM | POA: Insufficient documentation

## 2020-03-11 DIAGNOSIS — I214 Non-ST elevation (NSTEMI) myocardial infarction: Secondary | ICD-10-CM | POA: Diagnosis not present

## 2020-03-11 DIAGNOSIS — Z4659 Encounter for fitting and adjustment of other gastrointestinal appliance and device: Secondary | ICD-10-CM | POA: Diagnosis not present

## 2020-03-11 DIAGNOSIS — K3189 Other diseases of stomach and duodenum: Secondary | ICD-10-CM | POA: Diagnosis not present

## 2020-03-11 DIAGNOSIS — Z48815 Encounter for surgical aftercare following surgery on the digestive system: Secondary | ICD-10-CM | POA: Diagnosis not present

## 2020-03-11 DIAGNOSIS — R8569 Abnormal cytological findings in specimens from other digestive organs and abdominal cavity: Secondary | ICD-10-CM | POA: Diagnosis not present

## 2020-03-11 HISTORY — PX: BILIARY BRUSHING: SHX6843

## 2020-03-11 HISTORY — PX: STENT REMOVAL: SHX6421

## 2020-03-11 HISTORY — PX: BILIARY DILATION: SHX6850

## 2020-03-11 HISTORY — PX: BIOPSY: SHX5522

## 2020-03-11 HISTORY — PX: ENDOSCOPIC RETROGRADE CHOLANGIOPANCREATOGRAPHY (ERCP) WITH PROPOFOL: SHX5810

## 2020-03-11 HISTORY — PX: REMOVAL OF STONES: SHX5545

## 2020-03-11 HISTORY — PX: ESOPHAGOGASTRODUODENOSCOPY (EGD) WITH PROPOFOL: SHX5813

## 2020-03-11 SURGERY — ENDOSCOPIC RETROGRADE CHOLANGIOPANCREATOGRAPHY (ERCP) WITH PROPOFOL
Anesthesia: General

## 2020-03-11 MED ORDER — LIDOCAINE 2% (20 MG/ML) 5 ML SYRINGE
INTRAMUSCULAR | Status: DC | PRN
  Administered 2020-03-11: 40 mg via INTRAVENOUS

## 2020-03-11 MED ORDER — EPHEDRINE SULFATE-NACL 50-0.9 MG/10ML-% IV SOSY
PREFILLED_SYRINGE | INTRAVENOUS | Status: DC | PRN
  Administered 2020-03-11: 10 mg via INTRAVENOUS

## 2020-03-11 MED ORDER — INDOMETHACIN 50 MG RE SUPP
RECTAL | Status: AC
  Filled 2020-03-11: qty 2

## 2020-03-11 MED ORDER — CIPROFLOXACIN IN D5W 400 MG/200ML IV SOLN
INTRAVENOUS | Status: DC | PRN
Start: 2020-03-11 — End: 2020-03-11
  Administered 2020-03-11: 400 mg via INTRAVENOUS

## 2020-03-11 MED ORDER — GLUCAGON HCL RDNA (DIAGNOSTIC) 1 MG IJ SOLR
INTRAMUSCULAR | Status: AC
  Filled 2020-03-11: qty 1

## 2020-03-11 MED ORDER — PROPOFOL 10 MG/ML IV BOLUS
INTRAVENOUS | Status: AC
  Filled 2020-03-11: qty 20

## 2020-03-11 MED ORDER — FENTANYL CITRATE (PF) 100 MCG/2ML IJ SOLN
INTRAMUSCULAR | Status: AC
  Filled 2020-03-11: qty 2

## 2020-03-11 MED ORDER — SODIUM CHLORIDE 0.9 % IV SOLN
INTRAVENOUS | Status: DC | PRN
  Administered 2020-03-11: 40 mL

## 2020-03-11 MED ORDER — INDOMETHACIN 50 MG RE SUPP
RECTAL | Status: DC | PRN
  Administered 2020-03-11: 100 mg via RECTAL

## 2020-03-11 MED ORDER — CIPROFLOXACIN IN D5W 400 MG/200ML IV SOLN
INTRAVENOUS | Status: AC
  Filled 2020-03-11: qty 200

## 2020-03-11 MED ORDER — SODIUM CHLORIDE 0.9 % IV SOLN: INTRAVENOUS | Status: DC

## 2020-03-11 MED ORDER — DEXAMETHASONE SODIUM PHOSPHATE 10 MG/ML IJ SOLN
INTRAMUSCULAR | Status: DC | PRN
  Administered 2020-03-11: 4 mg via INTRAVENOUS

## 2020-03-11 MED ORDER — PROPOFOL 10 MG/ML IV BOLUS
INTRAVENOUS | Status: DC | PRN
  Administered 2020-03-11: 80 mg via INTRAVENOUS

## 2020-03-11 MED ORDER — CLOPIDOGREL BISULFATE 75 MG PO TABS: 75.0000 mg | ORAL_TABLET | Freq: Every day | ORAL | 3 refills | Status: DC

## 2020-03-11 MED ORDER — SUGAMMADEX SODIUM 200 MG/2ML IV SOLN
INTRAVENOUS | Status: DC | PRN
  Administered 2020-03-11: 200 mg via INTRAVENOUS

## 2020-03-11 MED ORDER — FENTANYL CITRATE (PF) 100 MCG/2ML IJ SOLN
INTRAMUSCULAR | Status: DC | PRN
  Administered 2020-03-11: 50 ug via INTRAVENOUS

## 2020-03-11 MED ORDER — ONDANSETRON HCL 4 MG/2ML IJ SOLN
INTRAMUSCULAR | Status: DC | PRN
  Administered 2020-03-11: 4 mg via INTRAVENOUS

## 2020-03-11 MED ORDER — ROCURONIUM BROMIDE 10 MG/ML (PF) SYRINGE
PREFILLED_SYRINGE | INTRAVENOUS | Status: DC | PRN
  Administered 2020-03-11: 50 mg via INTRAVENOUS

## 2020-03-11 MED ORDER — LACTATED RINGERS IV SOLN: Freq: Once | INTRAVENOUS | Status: AC

## 2020-03-11 MED ORDER — GLUCAGON HCL RDNA (DIAGNOSTIC) 1 MG IJ SOLR
INTRAMUSCULAR | Status: DC | PRN
Start: 2020-03-11 — End: 2020-03-11
  Administered 2020-03-11 (×2): .25 mg via INTRAVENOUS

## 2020-03-11 NOTE — H&P (Signed)
GASTROENTEROLOGY PROCEDURE H&P NOTE   Primary Care Physician: Isaac Bliss, Rayford Halsted, MD  HPI: [name] is a 80 y.o. male who presents for ERCP for stent exchange/pull in setting of previous undifferentiated stricture of distal CBD.  Past Medical History:  Diagnosis Date  . Allergy   . B12 deficiency anemia   . Blood transfusion without reported diagnosis   . CAD (coronary artery disease)   . Cancer ([place])    bladder-   . Colon polyps   . COPD (chronic obstructive pulmonary disease) ([place])   . Depression   . Esophagus, Barrett's   . GERD (gastroesophageal reflux disease)   . History of bladder cancer    Bladder cancer "8 times"  . History of hiatal hernia   . Hyperlipidemia   . Hypertension   . Localized osteoarthrosis, lower leg   . Restless leg syndrome   . Sleep apnea    does not wear cpap  . Stenosis of esophagus    Past Surgical History:  Procedure Laterality Date  . BILIARY BRUSHING  04/01/2018   Procedure: BILIARY BRUSHING;  Surgeon: Rush Landmark Telford Nab., MD;  Location: [place];  Service: Gastroenterology;;  . BILIARY BRUSHING  09/12/2018   Procedure: BILIARY BRUSHING;  Surgeon: Irving Copas., MD;  Location: [place];  Service: Gastroenterology;;  . BILIARY BRUSHING  11/28/2018   Procedure: BILIARY BRUSHING;  Surgeon: Irving Copas., MD;  Location: [place];  Service: Gastroenterology;;  . BILIARY DILATION  09/12/2018   Procedure: BILIARY DILATION;  Surgeon: Irving Copas., MD;  Location: [place];  Service: Gastroenterology;;  . BILIARY DILATION  11/28/2018   Procedure: BILIARY DILATION;  Surgeon: Irving Copas., MD;  Location: [place];  Service: Gastroenterology;;  . BILIARY STENT PLACEMENT  04/01/2018   Procedure: BILIARY STENT PLACEMENT;  Surgeon: Irving Copas., MD;  Location: [place];  Service: Gastroenterology;;  . BILIARY STENT PLACEMENT  09/12/2018   Procedure: BILIARY  STENT PLACEMENT;  Surgeon: Irving Copas., MD;  Location: [place];  Service: Gastroenterology;;  . BILIARY STENT PLACEMENT  11/28/2018   Procedure: BILIARY STENT PLACEMENT;  Surgeon: Irving Copas., MD;  Location: [place];  Service: Gastroenterology;;  . BIOPSY  04/01/2018   Procedure: BIOPSY;  Surgeon: Irving Copas., MD;  Location: [place];  Service: Gastroenterology;;  . BIOPSY  09/12/2018   Procedure: BIOPSY;  Surgeon: Irving Copas., MD;  Location: [place];  Service: Gastroenterology;;  . BIOPSY  11/28/2018   Procedure: BIOPSY;  Surgeon: Irving Copas., MD;  Location: [facility];  Service: Gastroenterology;;  . bladder cancer      x 8 cystoscopy  . CERVICAL DISCECTOMY     ACDF  . COLONOSCOPY  11/17/2005   normal   . CORONARY ARTERY BYPASS GRAFT     x4  . CORONARY STENT INTERVENTION N/A 01/05/2019   Procedure: CORONARY STENT INTERVENTION;  Surgeon: Nelva Bush, MD;  Location: [place] [facility];  Service: Cardiovascular;  Laterality: N/A;  . ENDOSCOPIC MUCOSAL RESECTION  09/12/2018   Procedure: ENDOSCOPIC MUCOSAL RESECTION;  Surgeon: Rush Landmark, Telford Nab., MD;  Location: [place] [facility];  Service: Gastroenterology;;  . ENDOSCOPIC RETROGRADE CHOLANGIOPANCREATOGRAPHY (ERCP) WITH PROPOFOL N/A 04/01/2018   Procedure: ENDOSCOPIC RETROGRADE CHOLANGIOPANCREATOGRAPHY (ERCP) WITH PROPOFOL;  Surgeon: Irving Copas., MD;  Location: [place];  Service: Gastroenterology;  Laterality: N/A;  . ENDOSCOPIC RETROGRADE CHOLANGIOPANCREATOGRAPHY (ERCP) WITH PROPOFOL N/A 09/12/2018   Procedure: ENDOSCOPIC RETROGRADE CHOLANGIOPANCREATOGRAPHY (ERCP) WITH PROPOFOL;  Surgeon: Irving Copas.,  MD;  Location: [place];  Service: Gastroenterology;  Laterality: N/A;  . ERCP N/A 11/28/2018   Procedure: ENDOSCOPIC RETROGRADE CHOLANGIOPANCREATOGRAPHY (ERCP) +EGD with spyglass;  Surgeon: Rush Landmark Telford Nab., MD;  Location: [place];  Service: Gastroenterology;  Laterality: N/A;  . ESOPHAGOGASTRODUODENOSCOPY  04/29/2010  . ESOPHAGOGASTRODUODENOSCOPY (EGD) WITH PROPOFOL N/A 04/01/2018   Procedure: ESOPHAGOGASTRODUODENOSCOPY (EGD) WITH PROPOFOL;  Surgeon: Rush Landmark Telford Nab., MD;  Location: [place];  Service: Gastroenterology;  Laterality: N/A;  . ESOPHAGOGASTRODUODENOSCOPY (EGD) WITH PROPOFOL N/A 09/12/2018   Procedure: ESOPHAGOGASTRODUODENOSCOPY (EGD) WITH PROPOFOL;  Surgeon: Rush Landmark Telford Nab., MD;  Location: [place];  Service: Gastroenterology;  Laterality: N/A;  . ESOPHAGOGASTRODUODENOSCOPY (EGD) WITH PROPOFOL N/A 11/28/2018   Procedure: ESOPHAGOGASTRODUODENOSCOPY (EGD) WITH PROPOFOL;  Surgeon: Rush Landmark Telford Nab., MD;  Location: [place];  Service: Gastroenterology;  Laterality: N/A;  . EUS  04/01/2018   Procedure: FULL UPPER ENDOSCOPIC ULTRASOUND (EUS) RADIAL;  Surgeon: Irving Copas., MD;  Location: [place];  Service: Gastroenterology;;  . EUS N/A 09/12/2018   Procedure: UPPER ENDOSCOPIC ULTRASOUND (EUS) RADIAL;  Surgeon: Irving Copas., MD;  Location: [place];  Service: Gastroenterology;  Laterality: N/A;  . FINE NEEDLE ASPIRATION  09/12/2018   Procedure: FINE NEEDLE ASPIRATION (FNA) LINEAR;  Surgeon: Irving Copas., MD;  Location: [place];  Service: Gastroenterology;;  . HEMOSTASIS CLIP PLACEMENT  09/12/2018   Procedure: HEMOSTASIS CLIP PLACEMENT;  Surgeon: Irving Copas., MD;  Location: [facility];  Service: Gastroenterology;;  . I & D EXTREMITY Left 11/24/2016   Procedure: IRRIGATION AND DEBRIDEMENT LEFT HAND, THUMB, INDEX, MIDDLE, RING, AND SMALL FINGERS WITH RECONSTRUCTION;  Surgeon: Roseanne Kaufman, MD;  Location: [place];  Service: Orthopedics;  Laterality: Left;  . KNEE ARTHROSCOPY Left   . LEFT HEART CATH AND CORS/GRAFTS ANGIOGRAPHY N/A 01/05/2019   Procedure: LEFT HEART CATH AND CORS/GRAFTS ANGIOGRAPHY;  Surgeon: Nelva Bush,  MD;  Location: [place] [facility];  Service: Cardiovascular;  Laterality: N/A;  . LUMBAR LAMINECTOMY     and fusion x 2  . NASAL SINUS SURGERY    . POPLITEAL SYNOVIAL CYST EXCISION    . REMOVAL OF STONES  04/01/2018   Procedure: REMOVAL OF STONES;  Surgeon: Rush Landmark Telford Nab., MD;  Location: [place];  Service: Gastroenterology;;  . REMOVAL OF STONES  09/12/2018   Procedure: REMOVAL OF STONES;  Surgeon: Irving Copas., MD;  Location: [place];  Service: Gastroenterology;;  . REMOVAL OF STONES  11/28/2018   Procedure: REMOVAL OF STONES;  Surgeon: Irving Copas., MD;  Location: [place];  Service: Gastroenterology;;  . Azzie Almas DILATION N/A 09/12/2018   Procedure: Azzie Almas DILATION;  Surgeon: Irving Copas., MD;  Location: [place];  Service: Gastroenterology;  Laterality: N/A;  . SAVORY DILATION N/A 11/28/2018   Procedure: SAVORY DILATION;  Surgeon: Rush Landmark Telford Nab., MD;  Location: [place];  Service: Gastroenterology;  Laterality: N/A;  . SPHINCTEROTOMY  04/01/2018   Procedure: SPHINCTEROTOMY;  Surgeon: Rush Landmark Telford Nab., MD;  Location: [place];  Service: Gastroenterology;;  . Bess Kinds CHOLANGIOSCOPY N/A 11/28/2018   Procedure: DGUYQIHK CHOLANGIOSCOPY;  Surgeon: Irving Copas., MD;  Location: [place];  Service: Gastroenterology;  Laterality: N/A;  . STENT REMOVAL  09/12/2018   Procedure: STENT REMOVAL;  Surgeon: Irving Copas., MD;  Location: [place];  Service: Gastroenterology;;  . Lavell Islam REMOVAL  11/28/2018   Procedure: STENT REMOVAL;  Surgeon: Irving Copas., MD;  Location: [place];  Service: Gastroenterology;;  . Lia Foyer LIFTING INJECTION  09/12/2018   Procedure:  SUBMUCOSAL LIFTING INJECTION;  Surgeon: Rush Landmark Telford Nab., MD;  Location: [place];  Service: Gastroenterology;;   Current Facility-Administered Medications  Medication Dose Route Frequency Provider Last Rate Last  Admin  . 0.9 %  sodium chloride infusion   Intravenous Continuous Mansouraty, Telford Nab., MD       Allergies  Allergen Reactions  . Losartan Potassium Other (See Comments)    Hyperkalemia  . Tape Itching and Rash    reddened skin    Family History  Problem Relation Age of Onset  . Melanoma Mother   . Stroke Father   . Hypertension Father   . Coronary artery disease Other   . Colon cancer Neg Hx   . Esophageal cancer Neg Hx   . Stomach cancer Neg Hx   . Rectal cancer Neg Hx   . Pancreatic cancer Neg Hx   . Liver disease Neg Hx   . Inflammatory bowel disease Neg Hx    Social History   Socioeconomic History  . Marital status: Married    Spouse name: Not on file  . Number of children: Not on file  . Years of education: Not on file  . Highest education level: Not on file  Occupational History  . Not on file  Tobacco Use  . Smoking status: Former Smoker    Packs/day: 0.50    Years: 5.00    Pack years: 2.50    Types: Cigarettes    Quit date: 03/30/1976    Years since quitting: 43.9  . Smokeless tobacco: Never Used  Vaping Use  . Vaping Use: Never used  Substance and Sexual Activity  . Alcohol use: No    Alcohol/week: 0.0 standard drinks  . Drug use: No  . Sexual activity: Yes  Other Topics Concern  . Not on file  Social History Narrative  . Not on file   Social Determinants of Health   Financial Resource Strain: Low Risk   . Difficulty of Paying Living Expenses: Not hard at all  Food Insecurity: Not on file  Transportation Needs: No Transportation Needs  . Lack of Transportation (Medical): No  . Lack of Transportation (Non-Medical): No  Physical Activity: Not on file  Stress: Not on file  Social Connections: Not on file  Intimate Partner Violence: Not on file    Physical Exam: Vital signs in last 24 hours:     GEN: NAD EYE: Sclerae anicteric ENT: MMM CV: Non-tachycardic GI: Soft, mild TTP in midabdomen NEURO:  Alert & Oriented x 3  Lab  Results: No results for input(s): WBC, HGB, HCT, PLT in the last 72 hours. BMET No results for input(s): NA, K, CL, CO2, GLUCOSE, BUN, CREATININE, CALCIUM in the last 72 hours. LFT No results for input(s): PROT, ALBUMIN, AST, ALT, ALKPHOS, BILITOT, BILIDIR, IBILI in the last 72 hours. PT/INR No results for input(s): LABPROT, INR in the last 72 hours.   Impression / Plan: This is a 80 y.o.male who presents for ERCP for stent exchange/pull in setting of previous undifferentiated stricture of distal CBD.  The risks of an ERCP were discussed at length, including but not limited to the risk of perforation, bleeding, abdominal pain, post-ERCP pancreatitis (while usually mild can be severe and even life threatening).  The risks and benefits of endoscopic evaluation were discussed with the patient; these include but are not limited to the risk of perforation, infection, bleeding, missed lesions, lack of diagnosis, severe illness requiring hospitalization, as well as anesthesia and sedation related illnesses.  The patient is agreeable to proceed.    Justice Britain, MD [facility] Office # [phone]

## 2020-03-11 NOTE — Op Note (Addendum)
[facility] Patient Name: [name] Procedure Date: 03/11/2020 MRN: [id number] Attending MD: Justice Britain , MD Date of Birth: 12-Aug-1940 CSN: [id number] Age: 80 Admit Type: Inpatient Procedure:                ERCP Indications:              Common bile duct stricture, Follow-up of bile duct                            stone(s), Stent change, Biliary stent removal,                            History of Choledocholithiasis and s/p                            Cholecystectomy Providers:                Justice Britain, MD, Kary Kos RN, RN, Cletis Athens, Technician Referring MD:             Victorino Sparrow. Jamelle Rushing. Isaac Bliss Medicines:                General Anesthesia, Cipro 400 mg IV, Indomethacin                            100 mg PR, Glucagon 0.5 mg IV Complications:            No immediate complications. Estimated Blood Loss:     Estimated blood loss was minimal. Procedure:                Pre-Anesthesia Assessment:                           - Prior to the procedure, a History and Physical                            was performed, and patient medications and                            allergies were reviewed. The patient's tolerance of                            previous anesthesia was also reviewed. The risks                            and benefits of the procedure and the sedation                            options and risks were discussed with the patient.                            All questions were answered, and informed consent  was obtained. Prior Anticoagulants: The patient has                            taken Plavix (clopidogrel), last dose was 5 days                            prior to procedure. ASA Grade Assessment: III - A                            patient with severe systemic disease. After                            reviewing the risks and benefits, the patient was                             deemed in satisfactory condition to undergo the                            procedure.                           After obtaining informed consent, the scope was                            passed under direct vision. Throughout the                            procedure, the patient's blood pressure, pulse, and                            oxygen saturations were monitored continuously. The                            GIF-H190 ([id number] Olympus gastroscope was                            introduced through the mouth, and used to inject                            contrast into and used to locate the major papilla.                            The [employer] D single use                            duodenoscope was introduced through the mouth, and                            used to inject contrast into and used to inject                            contrast into the bile duct. The ERCP was  accomplished without difficulty. The patient                            tolerated the procedure. Scope In: Scope Out: Findings:      A scout film of the abdomen was obtained. Three stents ending in the       main bile duct were seen. Surgical clips, consistent with previous       cholecystectomy, were seen in the area of the right upper quadrant of       the abdomen.      The upper GI tract was traversed under direct vision without detailed       examination. No gross lesions were noted in the proximal esophagus and       in the mid esophagus. Scattered islands of salmon-colored mucosa were       present from 38 to 40 cm. No other visible abnormalities were present -       these were biopsied to rule out Barrett's. Striped moderately       erythematous mucosa without bleeding was found in the gastric antrum. No       other gross lesions were noted in the entire examined stomach. Biopsies       obtained to rule out H. pylori infection. No gross lesions  were noted in       the duodenal bulb, in the first portion of the duodenum and in the       second portion of the duodenum. A biliary sphincterotomy had been       performed. The sphincterotomy appeared open. Three plastic biliary       stents originating in the biliary tree were emerging from the major       papilla. Three stents were removed from the biliary tree using a snare.      A 0.035 inch x 260 cm straight Hydra Jagwire was passed into the biliary       tree. The Hydratome sphincterotome was passed over the guidewire and the       bile duct was then deeply cannulated. Contrast was injected. I       personally interpreted the bile duct images. Ductal flow of contrast was       adequate. Image quality was adequate. Contrast extended to the hepatic       ducts. Opacification of the entire biliary tree except for the cystic       duct and gallbladder was successful. The maximum diameter of the main       bile duct was 16 mm. The lower third of the main bile duct and middle       third of the main bile duct contained filling defects thought to be       sludge v stones. The lower third of the main duct contained a single       mild tapering distally. To discover objects, the biliary tree was swept       with a retrieval balloon starting at the bifurcation. Sludge was swept       from the duct. A few stone fragments were removed. No stones remained.       Dilation of the distal common bile duct with a CRE 08-27-08 mm balloon (to       a maximum balloon size of 10 mm) dilator was successful to ensure       adequate drainage. The balloon was able  to be moved with ease. Cells for       cytology were obtained by brushing the distal CBD where previous       narrowing had been noted. The biliary tree was swept once again with a       retrieval balloon starting at the bifurcation. Nothing was found. An       occlusion cholangiogram was performed that showed no further significant       biliary  pathology. I also injected contrast with the balloon on the       ampulla and there was partial PD injection, which had not been intended       but immediately stopped injecting.      The duodenoscope was withdrawn from the patient. Impression:               - No gross lesions in esophagus proximally.                            Salmon-colored mucosal islands suspicious for                            short-segment Barrett's esophagus - biopsied.                           - Erythematous mucosa in the antrum. No other gross                            lesions in the stomach. Biopsied.                           - No gross lesions in the duodenal bulb, in the                            first portion of the duodenum and in the second                            portion of the duodenum.                           - Prior biliary sphincterotomy appeared open. Three                            stents from the biliary tree were seen in the major                            papilla - these were removed and sent for cytology.                           - The fluoroscopic examination was suspicious for                            sludge as well as biliary dilation in the main duct.                           - A fine tapering was noted distally. Not clearly a  stricture but could be narrowing. This was dilated                            and brushed for cytology.                           - Choledocholithiasis was found. Complete removal                            was accomplished by sweeping.                           - At end of procedure, the biliary tree was swept                            and nothing was found.                           - Pull through of the 12 mm balloon through the                            entire duct was felt to be adequate so I did not                            replace the previous plastic stents for a trial of                            how the patient  does without stenting in regards to                            LFT pattern and overall clinical scenario. Moderate Sedation:      Not Applicable - Patient had care per Anesthesia. Recommendation:           - The patient will be observed post-procedure,                            until all discharge criteria are met.                           - Discharge patient to home.                           - Patient has a contact number available for                            emergencies. The signs and symptoms of potential                            delayed complications were discussed with the                            patient. Return to normal activities tomorrow.  Written discharge instructions were provided to the                            patient.                           - Low fat diet.                           - Observe patient's clinical course.                           - Await cytology results and await path results.                           - Check liver enzymes (AST, ALT, alkaline                            phosphatase, bilirubin) as well as IgG4 level in                            1-2 weeks.                           - Watch for pancreatitis, bleeding, perforation,                            and cholangitis.                           - If concerning findings of progressive LFT                            abnormalities or jaundice, will need to let us know                            as he will need a FCSEMS to be placed thereafter.                           - Restart Plavix in 24 hours (2/22 PM)                           - The findings and recommendations were discussed                            with the patient.                           - The findings and recommendations were discussed                            with the patient's family. Procedure Code(s):        --- Professional ---                           [phone], Endoscopic retrograde  cholangiopancreatography (ERCP); with removal of                            foreign body(s) or stent(s) from biliary/pancreatic                            duct(s)                           43264, Endoscopic retrograde                            cholangiopancreatography (ERCP); with removal of                            calculi/debris from biliary/pancreatic duct(s) Diagnosis Code(s):        --- Professional ---                           K22.8, Other specified diseases of esophagus                           K80.51, Calculus of bile duct without cholangitis                            or cholecystitis with obstruction                           K31.89, Other diseases of stomach and duodenum                           Z96.89, Presence of other specified functional                            implants                           Z46.59, Encounter for fitting and adjustment of                            other gastrointestinal appliance and device CPT copyright 2019 [employer]. All rights reserved. The codes documented in this report are preliminary and upon coder review may  be revised to meet current compliance requirements. Justice Britain, MD 03/11/2020 11:39:36 AM Number of Addenda: 0

## 2020-03-11 NOTE — Anesthesia Procedure Notes (Signed)
Procedure Name: Intubation Date/Time: 03/11/2020 10:01 AM Performed by: Niel Hummer, CRNA Pre-anesthesia Checklist: Patient identified, Emergency Drugs available, Suction available and Patient being monitored Patient Re-evaluated:Patient Re-evaluated prior to induction Oxygen Delivery Method: Circle system utilized Induction Type: IV induction Ventilation: Oral airway inserted - appropriate to patient size and Two handed mask ventilation required Laryngoscope Size: Mac and 4 Grade View: Grade I Tube type: Oral Tube size: 7.5 mm Number of attempts: 1 Airway Equipment and Method: Stylet Placement Confirmation: ETT inserted through vocal cords under direct vision,  positive ETCO2 and breath sounds checked- equal and bilateral Secured at: 22 cm Tube secured with: Tape Dental Injury: Teeth and Oropharynx as per pre-operative assessment

## 2020-03-11 NOTE — Anesthesia Preprocedure Evaluation (Addendum)
Anesthesia Evaluation  Patient identified by MRN, date of birth, ID band Patient awake    Reviewed: Allergy & Precautions, NPO status , Patient's Chart, lab work & pertinent test results, reviewed documented beta blocker date and time   Airway Mallampati: II  TM Distance: >3 FB Neck ROM: Full    Dental  (+) Edentulous Upper, Poor Dentition, Chipped,    Pulmonary sleep apnea , COPD, former smoker,     + decreased breath sounds      Cardiovascular hypertension, Pt. on medications and Pt. on home beta blockers + CAD, + Past MI, + CABG, +CHF and + DOE   Rhythm:Regular Rate:Normal     Neuro/Psych PSYCHIATRIC DISORDERS Depression  Neuromuscular disease    GI/Hepatic Neg liver ROS, hiatal hernia, GERD  Medicated,  Endo/Other  diabetesHypothyroidism   Renal/GU Renal disease     Musculoskeletal  (+) Arthritis ,   Abdominal Normal abdominal exam  (+)   Peds  Hematology   Anesthesia Other Findings   Reproductive/Obstetrics                            Anesthesia Physical Anesthesia Plan  ASA: III  Anesthesia Plan: General   Post-op Pain Management:    Induction: Intravenous  PONV Risk Score and Plan: 1 and Propofol infusion and Ondansetron  Airway Management Planned: Oral ETT  Additional Equipment: None  Intra-op Plan:   Post-operative Plan: Extubation in OR  Informed Consent: I have reviewed the patients History and Physical, chart, labs and discussed the procedure including the risks, benefits and alternatives for the proposed anesthesia with the patient or authorized representative who has indicated his/her understanding and acceptance.     Dental advisory given  Plan Discussed with: CRNA  Anesthesia Plan Comments: (Echo:  1. Left ventricular ejection fraction, by estimation, is 50 to 55%. The  left ventricle has low normal function. The left ventricle has no regional  wall  motion abnormalities. Left ventricular diastolic parameters are  consistent with Grade II diastolic  dysfunction (pseudonormalization).  2. Right ventricular systolic function is normal. The right ventricular  size is normal. There is moderately elevated pulmonary artery systolic  pressure.  3. Left atrial size was moderately dilated.  4. The mitral valve is normal in structure. Trivial mitral valve  regurgitation. No evidence of mitral stenosis.  5. The aortic valve is grossly normal. Aortic valve regurgitation is  trivial. No aortic stenosis is present. )        Anesthesia Quick Evaluation

## 2020-03-11 NOTE — Telephone Encounter (Signed)
The pt has been advised and did arrive at the appt as planned.

## 2020-03-11 NOTE — Anesthesia Postprocedure Evaluation (Signed)
Anesthesia Post Note  Patient: [name]  Procedure(s) Performed: ENDOSCOPIC RETROGRADE CHOLANGIOPANCREATOGRAPHY (ERCP) WITH PROPOFOL (N/A ) ESOPHAGOGASTRODUODENOSCOPY (EGD) WITH PROPOFOL (N/A ) BIOPSY STENT REMOVAL REMOVAL OF STONES BILIARY DILATION BILIARY BRUSHING     Patient location during evaluation: PACU Anesthesia Type: General Level of consciousness: awake and alert Pain management: pain level controlled Vital Signs Assessment: post-procedure vital signs reviewed and stable Respiratory status: spontaneous breathing, nonlabored ventilation, respiratory function stable and patient connected to nasal cannula oxygen Cardiovascular status: blood pressure returned to baseline and stable Postop Assessment: no apparent nausea or vomiting Anesthetic complications: no   No complications documented.  Last Vitals:  Vitals:   03/11/20 1140 03/11/20 1150  BP: (!) 155/57 (!) 155/71  Pulse: (!) 56 (!) 58  Resp: 18 14  Temp:    SpO2: 100% 95%    Last Pain:  Vitals:   03/11/20 1150  TempSrc:   PainSc: 3                  Effie Berkshire

## 2020-03-11 NOTE — Discharge Instructions (Signed)
YOU HAD AN ENDOSCOPIC PROCEDURE TODAY: Refer to the procedure report and other information in the discharge instructions given to you for any specific questions about what was found during the examination. If this information does not answer your questions, please call [place] office at [phone] to clarify.   YOU SHOULD EXPECT: Some feelings of bloating in the abdomen. Passage of more gas than usual. Walking can help get rid of the air that was put into your GI tract during the procedure and reduce the bloating. If you had a lower endoscopy (such as a colonoscopy or flexible sigmoidoscopy) you may notice spotting of blood in your stool or on the toilet paper. Some abdominal soreness may be present for a day or two, also.  DIET: Your first meal following the procedure should be a light meal and then it is ok to progress to your normal diet. A half-sandwich or bowl of soup is an example of a good first meal. Heavy or fried foods are harder to digest and may make you feel nauseous or bloated. Drink plenty of fluids but you should avoid alcoholic beverages for 24 hours. If you had a esophageal dilation, please see attached instructions for diet.    ACTIVITY: Your care partner should take you home directly after the procedure. You should plan to take it easy, moving slowly for the rest of the day. You can resume normal activity the day after the procedure however YOU SHOULD NOT DRIVE, use power tools, machinery or perform tasks that involve climbing or major physical exertion for 24 hours (because of the sedation medicines used during the test).   SYMPTOMS TO REPORT IMMEDIATELY: A gastroenterologist can be reached at any hour. Please call [phone]  for any of the following symptoms:   Following upper endoscopy (EGD, EUS, ERCP, esophageal dilation) Vomiting of blood or coffee ground material  New, significant abdominal pain  New, significant chest pain or pain under the shoulder blades  Painful or  persistently difficult swallowing  New shortness of breath  Black, tarry-looking or red, bloody stools  FOLLOW UP:  If any biopsies were taken you will be contacted by phone or by letter within the next 1-3 weeks. Call [phone]  if you have not heard about the biopsies in 3 weeks.  Please also call with any specific questions about appointments or follow up tests.  

## 2020-03-11 NOTE — Transfer of Care (Signed)
Immediate Anesthesia Transfer of Care Note  Patient: [name]  Procedure(s) Performed: ENDOSCOPIC RETROGRADE CHOLANGIOPANCREATOGRAPHY (ERCP) WITH PROPOFOL (N/A ) ESOPHAGOGASTRODUODENOSCOPY (EGD) WITH PROPOFOL (N/A ) BIOPSY STENT REMOVAL REMOVAL OF STONES BILIARY DILATION  Patient Location: PACU  Anesthesia Type:General  Level of Consciousness: awake, alert  and oriented  Airway & Oxygen Therapy: Patient Spontanous Breathing and Patient connected to face mask oxygen  Post-op Assessment: Report given to RN, Post -op Vital signs reviewed and stable and Patient moving all extremities X 4  Post vital signs: Reviewed and stable  Last Vitals:  Vitals Value Taken Time  BP    Temp    Pulse    Resp    SpO2      Last Pain:  Vitals:   03/11/20 0923  TempSrc: Axillary  PainSc: 6       Patients Stated Pain Goal: 0 (40/76/80 8811)  Complications: No complications documented.

## 2020-03-12 LAB — SURGICAL PATHOLOGY

## 2020-03-13 ENCOUNTER — Encounter (HOSPITAL_COMMUNITY): Payer: Self-pay | Admitting: Gastroenterology

## 2020-03-13 LAB — CYTOLOGY - NON PAP

## 2020-03-15 ENCOUNTER — Encounter: Payer: Self-pay | Admitting: Gastroenterology

## 2020-03-15 ENCOUNTER — Other Ambulatory Visit: Payer: Self-pay

## 2020-03-15 DIAGNOSIS — K831 Obstruction of bile duct: Secondary | ICD-10-CM

## 2020-03-22 ENCOUNTER — Ambulatory Visit: Payer: Medicare Other | Admitting: Cardiovascular Disease

## 2020-03-22 ENCOUNTER — Encounter: Payer: Self-pay | Admitting: Cardiovascular Disease

## 2020-03-22 ENCOUNTER — Other Ambulatory Visit: Payer: Self-pay

## 2020-03-22 ENCOUNTER — Other Ambulatory Visit (INDEPENDENT_AMBULATORY_CARE_PROVIDER_SITE_OTHER): Payer: Medicare Other

## 2020-03-22 DIAGNOSIS — E785 Hyperlipidemia, unspecified: Secondary | ICD-10-CM | POA: Diagnosis not present

## 2020-03-22 DIAGNOSIS — Z951 Presence of aortocoronary bypass graft: Secondary | ICD-10-CM | POA: Diagnosis not present

## 2020-03-22 DIAGNOSIS — I10 Essential (primary) hypertension: Secondary | ICD-10-CM

## 2020-03-22 DIAGNOSIS — G4733 Obstructive sleep apnea (adult) (pediatric): Secondary | ICD-10-CM

## 2020-03-22 DIAGNOSIS — I5032 Chronic diastolic (congestive) heart failure: Secondary | ICD-10-CM | POA: Diagnosis not present

## 2020-03-22 DIAGNOSIS — I25709 Atherosclerosis of coronary artery bypass graft(s), unspecified, with unspecified angina pectoris: Secondary | ICD-10-CM | POA: Diagnosis not present

## 2020-03-22 DIAGNOSIS — K831 Obstruction of bile duct: Secondary | ICD-10-CM | POA: Diagnosis not present

## 2020-03-22 DIAGNOSIS — I25810 Atherosclerosis of coronary artery bypass graft(s) without angina pectoris: Secondary | ICD-10-CM

## 2020-03-22 LAB — HEPATIC FUNCTION PANEL
ALT: 9 U/L (ref 0–53)
AST: 17 U/L (ref 0–37)
Albumin: 3.7 g/dL (ref 3.5–5.2)
Alkaline Phosphatase: 67 U/L (ref 39–117)
Bilirubin, Direct: 0.1 mg/dL (ref 0.0–0.3)
Total Bilirubin: 0.4 mg/dL (ref 0.2–1.2)
Total Protein: 7.1 g/dL (ref 6.0–8.3)

## 2020-03-22 NOTE — Progress Notes (Signed)
Patient ID: [name], male   DOB: [birth year], 80 y.o.   MRN: [id number]      HPI: [name] is a 80 y.o. male who presents to the office today for a 9 month cardiology followup evaluation.  Mr. Burgo has known CAD and underwent CABG revascularization surgery in 2001 with a LIMA to his LAD, vein to the marginal, vein to the RCA. Catheterization in 2004 showed patent grafts. An echo Doppler study in August 2012 revealed an ejection fraction of 40-45% with moderate septal hypokinesis with grade 1 diastolic dysfunction as well as aortic valve sclerosis. Additional problems include GERD, mild emphysema, as well as obstructive sleep apnea. He no longer utilizes CPAP. He has lost approximately 25 pounds since his wife's death from a motor vehicle accident in January 2013.  He remains active and notes mild shortness of breath with activity particularly when walking up hills but this has not changed.  He also is a history of restless leg syndrome and has benefited with requip. Laboratory in 2013 demonstrated LDL 57 with LDL particle #1035, small LDL particle number was 611, slightly increased, HDLC was 35 triglycerides 93 total cholesterol 111 initial resistance course slightly elevated at 53.  BP blood work in March 2015 revealed normal renal function.  Fasting glucose was minimally increased at 106.  Total cholesterol was 116 triglycerides 80, and LDL 66, with an HDL of 34.  LDL particle number was slightly increased at 1190.  He undewent a five-year follow-up nuclear perfusion study in November 2016.  Ejection fraction was 54%.  He had normal perfusion.  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  He admits to arthralgias without myalgias.  He has issues with his in his legs and his peripheral neuropathy.  I further titrated his losartan to 75 mg and Bystolic to 10 mg.  He believes his blood pressure and pulse have been better.   He admits to leg discomfort which seems worse with atorvastatin.  When I last saw him, he had noticed some shortness of breath with uphill walking and with exertion but denied recurrent episodes of chest tightness.   He denied PND, orthopnea.  He underwent a follow-up echo Doppler study on 10/28/2016.  Ejection fraction was excellent at 60-65%.  There was grade 2 diastolic dysfunction.  There was evidence for aortic sclerosis without stenosis.  There was mild LA dilation.  He had trivial PR.    I saw him in October 2018. He subsequently  underwent left hand surgery.  His primary care with [name] is retired.  When I last saw him in November 2019 recently, he  noticed progressive increase in shortness of breath and this seems to develop even trying to walk up very small hill.  He denied any definitive chest tightness.  He has started to run out of some of his chronic pain meds which had been prescribed by [name].  He has not yet reestablished with another primary physician.  He was on Bystolic 10 mg and isosorbide 60 mg for his CAD and hypertension.  He was on Zetia 10 mg and rosuvastatin for hyperlipidemia.  He continues to be on chronic aspirin therapy.  He has restless legs and is on Requip.  His previous primary physician had prescribed Effexor in addition to Percocet.  In November 2019, with his change in symptomatology I recommended a follow-up echo Doppler study and a lexiscan  Myoview.  As he had had some issues with elevated potassium on ARB therapy and I started him on HCTZ other than spironolactone.  December 23, 2017 an echo Doppler study showed an EF of 60 to 65% with grade 1 diastolic dysfunction and mild aortic valve stenosis.  A Lexiscan Myoview study on January 04, 2018 was low risk and demonstrated normal perfusion without scar or ischemia.  EF was 51%.  He was hospitalized from March 11 through April 06, 2019 presenting with abdominal discomfort,.  Laboratory revealed  leukocytosis to 21,400, elevated lactic acid of 2.4, elevated lipase at 390 urinalysis notable for proteinuria and a creatinine had risen to 1.59 and there was mild elevation of AST and total bilirubin.  He was ultimately had positive blood cultures for coli to have sepsis and colitis.  An MRCP showed acute pancreatitis, mild gallbladder distention with peri-cholecystic fluid and gallbladder wall thickening.  School he underwent ERCP on April 01, 2018 which showed stricture and sludge without stone he underwent sphincterotomy and stent placement.  He is planned to be on antibiotics for several weeks.  During his hospitalization he developed acute kidney injury with peak creatinine at 2.27.  His medications were adjusted and on discharge creatinine was 1.75.    He was  evaluated by me in a telemedicine evaluation on May 19, 2018.  From a cardiac perspective he felt improved.  He was not having any leg swelling.  He denied any chest pain but continued to experience shortness of breath symptoms.  Creatinine had improved to 1.46.  He continues to have issues with back and leg discomfort and remotely had undergone steroid injections with [name].  I saw him in evaluation September 2020 at which time he continued  to experience his chronic shortness of breath but that has not increased in severity. He denies any chest pain. He has had some issues with swallowing and continues to see GI physician. He has undergone esophageal dilatation. He is unaware of any rhythm disturbance. He has been evaluated by [name] and he was referred for a lower extremity Doppler study which was negative for DVT. He had been noted to have a left pleural effusion which is followed by 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  He eventually underwent robotic cholecystectomy by Dr. Windle Guard on 01/02/2019.  He has biliary stents in place for distal biliary stricture. After his procedure, he developed a bilateral  pneumonia and shortness of breath with increasing troponin. CT of the chest was negative for PE.Patient was treated with IV Lasix and antibiotic. Troponin was elevated and he subsequently underwent cardiac catheterization on 01/05/2019 by Dr. Saunders Revel which showed 99% subtotally occluded SVG-RPDA/RPL. This was treated with 3 overlapping resolute Onyx drug-eluting stents. Postprocedure, it was recommended to continue aspirin and Plavix indefinitely and if the patient subsequently developed refractory symptoms there was consideration for PCI of ostial left main, left circumflex and OM1. Echocardiogram obtained on the same day showed EF 40 to 45%, severe hypokinesis of the entire inferior and inferoseptal wall, grade 1 DD, mild MR, cannot exclude ASD/PFO.  He was seen in follow-up by Almyra Deforest on January 19, 2019. He continued to have fatigue however gradually improving. He previously had a epigastric pain which he attributed to acid reflux which retrospectively may have been his angina since there was complete resolution of this discomfort following his hospitalization. He was walking at home multiple times throughout the day without any issue.   He was seen in  a telemedicine evaluation in March 2021 and over the several months prior to that evaluation he denied any recurrent chest pain episodes.  However he admitted to feeling "rough."  He was getting physical therapy times per week.  He has been staying at home all the time.  His heart rate has been regular.  He is not aware of any palpitations.  He had not been using CPAP.   When I last saw him in June 2021 he complained that he "hurts all over."  He states he has aches in his legs, back and shoulders.  Oftentimes he notices his legs hurt worse before he has to go to the bathroom.  He denied any anginal symptoms and was unaware of any cardiac arrhythmia.  He denied presyncope or syncope.    Since I last saw him, he has been evaluated by Coletta Memos in October 2021 and most recently January 2022.  He had been started on Lasix for leg swelling in October 2021 and continued to be on Bystolic, Imdur, aspirin/Plavix and statin.  Presently, he feels well.  He has lost weight.  He has a stent in the liver a due to his prior a sending cholangitis and pancreatitis due to biliary strictures.  He is scheduled to undergo a CT of his abdomen at the end of March for further evaluation.  He denies any chest pain or anginal symptomatology.  Breathing is better.  He admits to right shoulder discomfort.  He presents for reevaluation.  Past Medical History:  Diagnosis Date  . Allergy   . B12 deficiency anemia   . Blood transfusion without reported diagnosis   . CAD (coronary artery disease)   . Cancer ([place])    bladder-   . Colon polyps   . COPD (chronic obstructive pulmonary disease) ([place])   . Depression   . Esophagus, Barrett's   . GERD (gastroesophageal reflux disease)   . History of bladder cancer    Bladder cancer "8 times"  . History of hiatal hernia   . Hyperlipidemia   . Hypertension   . Localized osteoarthrosis, lower leg   . Restless leg syndrome   . Sleep apnea    does not wear cpap  . Stenosis of esophagus     Past Surgical History:  Procedure Laterality Date  . BILIARY BRUSHING  04/01/2018   Procedure: BILIARY BRUSHING;  Surgeon: Rush Landmark Telford Nab., MD;  Location: [place];  Service: Gastroenterology;;  . BILIARY BRUSHING  09/12/2018   Procedure: BILIARY BRUSHING;  Surgeon: Irving Copas., MD;  Location: [place];  Service: Gastroenterology;;  . BILIARY BRUSHING  11/28/2018   Procedure: BILIARY BRUSHING;  Surgeon: Irving Copas., MD;  Location: [place];  Service: Gastroenterology;;  . BILIARY BRUSHING  03/11/2020   Procedure: BILIARY BRUSHING;  Surgeon: Irving Copas., MD;  Location: [facility];  Service: Gastroenterology;;  . BILIARY DILATION  09/12/2018   Procedure: BILIARY  DILATION;  Surgeon: Irving Copas., MD;  Location: [place];  Service: Gastroenterology;;  . BILIARY DILATION  11/28/2018   Procedure: BILIARY DILATION;  Surgeon: Irving Copas., MD;  Location: [place];  Service: Gastroenterology;;  . BILIARY DILATION  03/11/2020   Procedure: BILIARY DILATION;  Surgeon: Irving Copas., MD;  Location: [facility];  Service: Gastroenterology;;  . BILIARY STENT PLACEMENT  04/01/2018   Procedure: BILIARY STENT PLACEMENT;  Surgeon: Irving Copas., MD;  Location: [place];  Service: Gastroenterology;;  . BILIARY STENT PLACEMENT  09/12/2018  Procedure: BILIARY STENT PLACEMENT;  Surgeon: Rush Landmark Telford Nab., MD;  Location: [place];  Service: Gastroenterology;;  . BILIARY STENT PLACEMENT  11/28/2018   Procedure: BILIARY STENT PLACEMENT;  Surgeon: Irving Copas., MD;  Location: [place];  Service: Gastroenterology;;  . BIOPSY  04/01/2018   Procedure: BIOPSY;  Surgeon: Irving Copas., MD;  Location: [place];  Service: Gastroenterology;;  . BIOPSY  09/12/2018   Procedure: BIOPSY;  Surgeon: Irving Copas., MD;  Location: [place];  Service: Gastroenterology;;  . BIOPSY  11/28/2018   Procedure: BIOPSY;  Surgeon: Irving Copas., MD;  Location: [place];  Service: Gastroenterology;;  . BIOPSY  03/11/2020   Procedure: BIOPSY;  Surgeon: Irving Copas., MD;  Location: [facility];  Service: Gastroenterology;;  . bladder cancer      x 8 cystoscopy  . CERVICAL DISCECTOMY     ACDF  . COLONOSCOPY  11/17/2005   normal   . CORONARY ARTERY BYPASS GRAFT     x4  . CORONARY STENT INTERVENTION N/A 01/05/2019   Procedure: CORONARY STENT INTERVENTION;  Surgeon: Nelva Bush, MD;  Location: [place] [facility];  Service: Cardiovascular;  Laterality: N/A;  . ENDOSCOPIC MUCOSAL RESECTION  09/12/2018   Procedure: ENDOSCOPIC MUCOSAL RESECTION;  Surgeon: Rush Landmark, Telford Nab., MD;  Location: [facility];  Service: Gastroenterology;;  . ENDOSCOPIC RETROGRADE CHOLANGIOPANCREATOGRAPHY (ERCP) WITH PROPOFOL N/A 04/01/2018   Procedure: ENDOSCOPIC RETROGRADE CHOLANGIOPANCREATOGRAPHY (ERCP) WITH PROPOFOL;  Surgeon: Irving Copas., MD;  Location: [place];  Service: Gastroenterology;  Laterality: N/A;  . ENDOSCOPIC RETROGRADE CHOLANGIOPANCREATOGRAPHY (ERCP) WITH PROPOFOL N/A 09/12/2018   Procedure: ENDOSCOPIC RETROGRADE CHOLANGIOPANCREATOGRAPHY (ERCP) WITH PROPOFOL;  Surgeon: Rush Landmark Telford Nab., MD;  Location: [place];  Service: Gastroenterology;  Laterality: N/A;  . ENDOSCOPIC RETROGRADE CHOLANGIOPANCREATOGRAPHY (ERCP) WITH PROPOFOL N/A 03/11/2020   Procedure: ENDOSCOPIC RETROGRADE CHOLANGIOPANCREATOGRAPHY (ERCP) WITH PROPOFOL;  Surgeon: Rush Landmark Telford Nab., MD;  Location: [facility];  Service: Gastroenterology;  Laterality: N/A;  . ERCP N/A 11/28/2018   Procedure: ENDOSCOPIC RETROGRADE CHOLANGIOPANCREATOGRAPHY (ERCP) +EGD with spyglass;  Surgeon: Rush Landmark Telford Nab., MD;  Location: [place];  Service: Gastroenterology;  Laterality: N/A;  . ESOPHAGOGASTRODUODENOSCOPY  04/29/2010  . ESOPHAGOGASTRODUODENOSCOPY (EGD) WITH PROPOFOL N/A 04/01/2018   Procedure: ESOPHAGOGASTRODUODENOSCOPY (EGD) WITH PROPOFOL;  Surgeon: Rush Landmark Telford Nab., MD;  Location: [place];  Service: Gastroenterology;  Laterality: N/A;  . ESOPHAGOGASTRODUODENOSCOPY (EGD) WITH PROPOFOL N/A 09/12/2018   Procedure: ESOPHAGOGASTRODUODENOSCOPY (EGD) WITH PROPOFOL;  Surgeon: Rush Landmark Telford Nab., MD;  Location: [place];  Service: Gastroenterology;  Laterality: N/A;  . ESOPHAGOGASTRODUODENOSCOPY (EGD) WITH PROPOFOL N/A 11/28/2018   Procedure: ESOPHAGOGASTRODUODENOSCOPY (EGD) WITH PROPOFOL;  Surgeon: Rush Landmark Telford Nab., MD;  Location: [place];  Service: Gastroenterology;  Laterality: N/A;  . ESOPHAGOGASTRODUODENOSCOPY (EGD) WITH PROPOFOL N/A 03/11/2020    Procedure: ESOPHAGOGASTRODUODENOSCOPY (EGD) WITH PROPOFOL;  Surgeon: Rush Landmark Telford Nab., MD;  Location: [facility];  Service: Gastroenterology;  Laterality: N/A;  . EUS  04/01/2018   Procedure: FULL UPPER ENDOSCOPIC ULTRASOUND (EUS) RADIAL;  Surgeon: Irving Copas., MD;  Location: [place];  Service: Gastroenterology;;  . EUS N/A 09/12/2018   Procedure: UPPER ENDOSCOPIC ULTRASOUND (EUS) RADIAL;  Surgeon: Irving Copas., MD;  Location: [place];  Service: Gastroenterology;  Laterality: N/A;  . FINE NEEDLE ASPIRATION  09/12/2018   Procedure: FINE NEEDLE ASPIRATION (FNA) LINEAR;  Surgeon: Irving Copas., MD;  Location: [place];  Service: Gastroenterology;;  . HEMOSTASIS CLIP PLACEMENT  09/12/2018   Procedure: HEMOSTASIS CLIP PLACEMENT;  Surgeon: Irving Copas., MD;  Location:  [facility] [facility];  Service: Gastroenterology;;  . I & D EXTREMITY Left 11/24/2016   Procedure: IRRIGATION AND DEBRIDEMENT LEFT HAND, THUMB, INDEX, MIDDLE, RING, AND SMALL FINGERS WITH RECONSTRUCTION;  Surgeon: Roseanne Kaufman, MD;  Location: [place];  Service: Orthopedics;  Laterality: Left;  . KNEE ARTHROSCOPY Left   . LEFT HEART CATH AND CORS/GRAFTS ANGIOGRAPHY N/A 01/05/2019   Procedure: LEFT HEART CATH AND CORS/GRAFTS ANGIOGRAPHY;  Surgeon: Nelva Bush, MD;  Location: [place] [facility];  Service: Cardiovascular;  Laterality: N/A;  . LUMBAR LAMINECTOMY     and fusion x 2  . NASAL SINUS SURGERY    . POPLITEAL SYNOVIAL CYST EXCISION    . REMOVAL OF STONES  04/01/2018   Procedure: REMOVAL OF STONES;  Surgeon: Rush Landmark Telford Nab., MD;  Location: [place];  Service: Gastroenterology;;  . REMOVAL OF STONES  09/12/2018   Procedure: REMOVAL OF STONES;  Surgeon: Irving Copas., MD;  Location: [place];  Service: Gastroenterology;;  . REMOVAL OF STONES  11/28/2018   Procedure: REMOVAL OF STONES;  Surgeon: Irving Copas., MD;  Location: [place];   Service: Gastroenterology;;  . REMOVAL OF STONES  03/11/2020   Procedure: REMOVAL OF STONES;  Surgeon: Irving Copas., MD;  Location: [facility];  Service: Gastroenterology;;  . Azzie Almas DILATION N/A 09/12/2018   Procedure: Azzie Almas DILATION;  Surgeon: Irving Copas., MD;  Location: [place];  Service: Gastroenterology;  Laterality: N/A;  . SAVORY DILATION N/A 11/28/2018   Procedure: SAVORY DILATION;  Surgeon: Rush Landmark Telford Nab., MD;  Location: [place];  Service: Gastroenterology;  Laterality: N/A;  . SPHINCTEROTOMY  04/01/2018   Procedure: SPHINCTEROTOMY;  Surgeon: Rush Landmark Telford Nab., MD;  Location: [place];  Service: Gastroenterology;;  . Bess Kinds CHOLANGIOSCOPY N/A 11/28/2018   Procedure: WPYKDXIP CHOLANGIOSCOPY;  Surgeon: Irving Copas., MD;  Location: [place];  Service: Gastroenterology;  Laterality: N/A;  . STENT REMOVAL  09/12/2018   Procedure: STENT REMOVAL;  Surgeon: Irving Copas., MD;  Location: [place];  Service: Gastroenterology;;  . Lavell Islam REMOVAL  11/28/2018   Procedure: STENT REMOVAL;  Surgeon: Irving Copas., MD;  Location: [place];  Service: Gastroenterology;;  . Lavell Islam REMOVAL  03/11/2020   Procedure: STENT REMOVAL;  Surgeon: Irving Copas., MD;  Location: [facility];  Service: Gastroenterology;;  . Lia Foyer LIFTING INJECTION  09/12/2018   Procedure: SUBMUCOSAL LIFTING INJECTION;  Surgeon: Irving Copas., MD;  Location: [facility];  Service: Gastroenterology;;    Allergies  Allergen Reactions  . Losartan Potassium Other (See Comments)    Hyperkalemia  . Tape Itching and Rash    reddened skin     Current Outpatient Medications  Medication Sig Dispense Refill  . acetaminophen (TYLENOL) 325 MG tablet Take 650 mg by mouth every 6 (six) hours as needed for mild pain.    [name] amLODipine (NORVASC) 2.5 MG tablet TAKE 2 TABLETS BY MOUTH DAILY (Patient taking differently: Take 5 mg by  mouth daily.) 90 tablet 8  . aspirin EC 81 MG tablet Take 81 mg by mouth at bedtime.    [name] BYSTOLIC 10 MG tablet Take 1 tablet by mouth once daily (Patient taking differently: Take 10 mg by mouth daily.) 90 tablet 3  . cetirizine (ZYRTEC) 10 MG tablet Take 10 mg by mouth daily.    . clopidogrel (PLAVIX) 75 MG tablet Take 1 tablet (75 mg total) by mouth daily. 90 tablet 3  . Coenzyme Q10 100 MG TABS Take 300 mg by mouth at bedtime.    [name]  cyanocobalamin (,VITAMIN B-12,) 1000 MCG/ML injection INJECT 1ML INTO THE MUSCLE FOR 1 DOSE (Patient taking differently: Inject 1,000 mcg into the skin every 30 (thirty) days.) 6 mL 0  . ezetimibe (ZETIA) 10 MG tablet Take 1 tablet (10 mg total) by mouth daily. 90 tablet 1  . ferrous sulfate 325 (65 FE) MG tablet Take 325 mg by mouth daily with breakfast.    . Fiber Complete TABS Take 1 each by mouth daily. Miralax gummie    . fluticasone (FLONASE) 50 MCG/ACT nasal spray Place 1-2 sprays into both nostrils at bedtime as needed for allergies or rhinitis.    . furosemide (LASIX) 40 MG tablet Take 1 tablet (40 mg total) by mouth every other day. (Patient taking differently: Take 40 mg by mouth daily as needed for fluid or edema.) 90 tablet 1  . isosorbide mononitrate (IMDUR) 60 MG 24 hr tablet Take 1 tablet by mouth once daily (Patient taking differently: Take 60 mg by mouth daily.) 90 tablet 3  . Multiple Vitamins-Minerals (MULTIVITAMIN WITH MINERALS) tablet Take 1 tablet by mouth daily.    . Naphazoline-Pheniramine (OPCON-A) 0.027-0.315 % SOLN Place 1 drop into both eyes daily as needed (itching eyes).    . NEEDLE, DISP, 25 G (B-D DISP NEEDLE 25GX1") 25G X 1" MISC Inject 1000 mcg into muscle once a month. 50 each 0  . nitroGLYCERIN (NITROSTAT) 0.4 MG SL tablet DISSOLVE ONE TABLET UNDER THE TONGUE EVERY 5 MINUTES AS NEEDED FOR CHEST PAIN. (Patient taking differently: Place 0.4 mg under the tongue every 5 (five) minutes as needed for chest pain.) 25 tablet 0  . omeprazole  (PRILOSEC) 40 MG capsule Take 1 capsule by mouth once daily (Patient taking differently: Take 40 mg by mouth at bedtime.) 30 capsule 0  . oxyCODONE-acetaminophen (PERCOCET) 10-325 MG tablet Take 1 tablet by mouth 2 (two) times daily as needed for pain. (Patient taking differently: Take 0.5 tablets by mouth 2 (two) times daily as needed for pain.) 60 tablet 0  . Probiotic Product (PROBIOTIC ADVANCED PO) Take 1 tablet by mouth daily. Gummie    . rOPINIRole (REQUIP) 2 MG tablet TAKE 1 TABLET BY MOUTH AT BEDTIME (Patient taking differently: Take 2 mg by mouth at bedtime.) 90 tablet 0  . rosuvastatin (CRESTOR) 10 MG tablet Take 1 tablet (10 mg total) by mouth daily. 90 tablet 3  . saccharomyces boulardii (FLORASTOR) 250 MG capsule Take 1 capsule (250 mg total) by mouth 2 (two) times daily. (Patient taking differently: Take 250 mg by mouth at bedtime.) 60 capsule 2  . venlafaxine (EFFEXOR) 75 MG tablet Take 1 tablet by mouth once daily (Patient taking differently: Take 75 mg by mouth daily.) 90 tablet 1   No current facility-administered medications for this visit.   Socially he is widowed in January 2013. He has 3 children 6 grandchildren. 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 There is no recent tobacco use.  no alcohol.  His daughter is the wife of my patient Mr. Theodosia Paling.  ROS General: Negative; No fevers, chills, or night sweats;  HEENT: Negative; No changes in vision or hearing, sinus congestion, difficulty swallowing Pulmonary: Negative; No cough, wheezing, shortness of breath, hemoptysis Cardiovascular: Negative; No chest pain, presyncope, syncope, palpatations.  Minimal shortness of breath with activity GI: GERD; recent pancreatitis and septic cholangitis GU: Recent acute kidney injury Musculoskeletal: Positive for multiple joint aches involving his  legs, hips, back, and shoulder Hematologic/Oncology:  Negative; no easy bruising, bleeding Endocrine: Negative; no heat/cold intolerance; no diabetes Neuro: Negative; no changes in balance, headaches Skin: Negative; No rashes or skin lesions Psychiatric: Negative; No behavioral problems, depression Sleep: He no longer uses CPAP with his 25 pound weight loss.  His restless legs improved with low-dose Requip; No snoring, daytime sleepiness, hypersomnolence, bruxism, hypnogognic hallucinations, no cataplexy Other comprehensive 14 point system review is negative.   PE BP 116/64   Pulse (!) 59   Ht _0  (1.753 m)   Wt 171 lb 3.2 oz (77.7 kg)   SpO2 97%   BMI 25.28 kg/m    Repeat blood pressure by me was 126/70  Wt Readings from Last 3 Encounters:  03/22/20 171 lb 3.2 oz (77.7 kg)  03/11/20 165 lb (74.8 kg)  02/29/20 175 lb 1.6 oz (79.4 kg)   General: Alert, oriented, no distress.  Skin: normal turgor, no rashes, warm and dry HEENT: Normocephalic, atraumatic. Pupils equal round and reactive to light; sclera anicteric; extraocular muscles intact;  Nose without nasal septal hypertrophy Mouth/Parynx benign; Mallinpatti scale Neck: No JVD, no carotid bruits; normal carotid upstroke Lungs: clear to ausculatation and percussion; no wheezing or rales Chest wall: without tenderness to palpitation Heart: PMI not displaced, RRR, s1 s2 normal, 4-0/3 systolic murmur, no diastolic murmur, no rubs, gallops, thrills, or heaves Abdomen: soft, nontender; no hepatosplenomehaly, BS+; abdominal aorta nontender and not dilated by palpation. Back: no CVA tenderness Pulses 2+ Musculoskeletal: full range of motion, normal strength, no joint deformities Extremities: no clubbing cyanosis or edema, Homan's sign negative  Neurologic: grossly nonfocal; Cranial nerves grossly wnl Psychologic: Normal mood and affect     ECG (independently read by me): Sinus bradycardia at 59; no ectopy, normal intervals  June 2021 ECG (independently read by me): Sinus  bradycardia 58 bpm.  Q wave in lead III.  QTc interval 426 ms.  No ectopy.  October 10, 2018 ECG (independently read by me): Sinus rhythm with mild sinus arrhythmia at 71 bpm.  QTc interval 465 ms.  No significant ST changes.  No ectopy.  December 13, 2017 ECG (independently read by me): NSR at 64; IRBBB; normal intervals  July 2018 ECG (independently read by me): Sinus bradycardia 53 bpm.  No ectopy.  Normal intervals.  No ST segment changes.  December 2017 ECG (independently read by me): Normal sinus rhythm at 74 bpm.  No ectopy.  Normal intervals.  No ST segment changes.  October 2016 ECG (independently read by me): Normal sinus rhythm at 61 bpm.  No ectopy.  Normal intervals.  May 2016 ECG (independently read by me): Sinus bradycardia 56 bpm.  No ectopy.  Mild RV conduction delay.  QTc interval 395  ECG (independently read by me): sinus bradycardia 56 bpm.  Mild RV conduction delay.  Borderline first-degree AV block with a PR interval at 208 ms.  No ectopy.  Prior 06/05/2013 ECG (independently read by me): Normal sinus rhythm at 63 beats per minute.  No ectopy; normal intervals.  Prior November 2014 ECG: Sinus rhythm at 68beats per minute. There is mild RV conduction delay. Intervals were normal.  LABS:  BMP Latest Ref Rng & Units 11/13/2019 11/09/2019 07/20/2019  Glucose 65 - 99 mg/dL 138(H) 134(H) 125(H)  BUN 8 - 27 mg/dL _1 Creatinine 0.76 - 1.27 mg/dL 1.62(H) 1.61(H) 1.46(H)  BUN/Creat Ratio 10 - _2 Sodium 134 - 144 mmol/L 145(H) 143 142  Potassium 3.5 - 5.2 mmol/L 4.4 4.5 4.7  Chloride 96 - 106 mmol/L 101 108 100  CO2 20 - 29 mmol/L 30(H) 30 25  Calcium 8.6 - 10.2 mg/dL 9.1 8.8 9.0   Hepatic Function Latest Ref Rng & Units 03/22/2020 11/09/2019 11/09/2019  Total Protein 6.0 - 8.3 g/dL 7.1 6.9 6.6  Albumin 3.5 - 5.2 g/dL 3.7 3.7 -  AST 0 - 37 U/L _0 ALT 0 - 53 U/L 9 8 7(L)  Alk Phosphatase 39 - 117 U/L 67 70 -  Total Bilirubin 0.2 - 1.2 mg/dL 0.4  0.4 0.4  Bilirubin, Direct 0.0 - 0.3 mg/dL 0.1 0.1 -    CBC Latest Ref Rng & Units 11/09/2019 07/20/2019 05/10/2019  WBC 3.8 - 10.8 Thousand/uL 7.2 8.7 6.2  Hemoglobin 13.2 - 17.1 g/dL 9.7(L) 11.4(L) 11.6(L)  Hematocrit 38.5 - 50.0 % 31.0(L) 33.8(L) 36.6(L)  Platelets 140 - 400 Thousand/uL 172 177 168     Lipid Panel     Component Value Date/Time   CHOL 113 07/20/2019 0850   CHOL 116 03/30/2013 1053   TRIG 145 07/20/2019 0850   TRIG 80 03/30/2013 1053   HDL 32 (L) 07/20/2019 0850   HDL 34 (L) 03/30/2013 1053   CHOLHDL 3.5 07/20/2019 0850   CHOLHDL 2.5 03/31/2018 0508   VLDL 14 03/31/2018 0508   LDLCALC 56 07/20/2019 0850   LDLCALC 66 03/30/2013 1053     RADIOLOGY: No results found.  ECHO 04/10/2119 IMPRESSIONS  1. Left ventricular ejection fraction, by estimation, is 50 to 55%. The  left ventricle has low normal function. The left ventricle has no regional  wall motion abnormalities. Left ventricular diastolic parameters are  consistent with Grade II diastolic  dysfunction (pseudonormalization).  2. Right ventricular systolic function is normal. The right ventricular  size is normal. There is moderately elevated pulmonary artery systolic  pressure.  3. Left atrial size was moderately dilated.  4. The mitral valve is normal in structure. Trivial mitral valve  regurgitation. No evidence of mitral stenosis.  5. The aortic valve is grossly normal. Aortic valve regurgitation is  trivial. No aortic stenosis is present.   IMPRESSION:  1. Coronary artery disease involving coronary bypass graft of native heart without angina pectoris on medical therapy ([place])   2. Coronary artery disease involving coronary bypass graft of native heart without angina pectoris   3. Hx of CABG   4. Essential hypertension   5. Chronic diastolic heart failure ([place])   6. Hyperlipidemia LDL goal <70   7. OSA (obstructive sleep apnea): No longer using CPAP     ASSESSMENT AND PLAN:  Mr.  Dorwart is a 80 year old Caucasian male who has known CAD and is 21 years status post  CABG revascularization surgery in 2001. His catheterization in 2004 for recurrent chest pain showed patent grafts. His ejection fraction of 40-45% was documented in August 2012.  In October 2018 an echo revealed an EF of 60 to 65% with grade 2 diastolic dysfunction, aortic valve sclerosis without stenosis mild left atrial dilation.   His  echo in December 2019 continued to show an EF of 60 to 65%.  There was now grade 1 diastolic dysfunction.  There was mild aortic stenosis. A Lexiscan Myoview study on January 04, 2018 was low risk and showed an EF of 51% without ischemia.  He developed  pancreatitis and felt to have acsending cholangitis with E. coli sepsis.  He underwent sphincterotomy and stent placement in  his bile duct by Dr. Justice Britain.  He has continued to have issues with significant shortness of breath which is stable but chronic.  His most recent catheterization from January 05, 2019  showed a 99% subtotally occluded vein graft to his right PDA/PL vessel which was treated successfully with 3 overlapping Resolute Onyx DES stents.  It is recommended that he continue aspirin/Plavix indefinitely.  Echo Doppler study on the same day showed an EF of 40 to 45%.  Presently he has been without recurrent anginal symptoms.  When I last saw him in June 2021 he was hurting all over.  At that time I reduced his statin therapy.  He is now on rosuvastatin 10 mg in addition to Zetia 10 mg.  LDL cholesterol in July 2021 was 56.  Blood pressure today is stable on amlodipine 5 mg, isosorbide 60 mg, Bystolic 10 mg.  He is not having anginal symptoms.  He continues to be on DAPT with aspirin/Plavix.  He will be undergoing follow-up CT imaging for further evaluation of his liver.  I will defer to the GI physician if Plavix needs to be held for the procedure and if necessary should be held for 5 days.  Currently his breathing is  improved.  He is not having any dyspnea.  He sees Dr. Isaac Bliss for primary care.  I will see him in 6 months for reevaluation   Troy Sine, MD, [facility]  03/24/2020 12:49 PM

## 2020-03-22 NOTE — Patient Instructions (Signed)
Medication Instructions:  The current medical regimen is effective;  continue present plan and medications.  *If you need a refill on your cardiac medications before your next appointment, please call your pharmacy*   Follow-Up: At [facility], you and your health needs are our priority.  As part of our continuing mission to provide you with exceptional heart care, we have created designated Provider Care Teams.  These Care Teams include your primary Cardiologist (physician) and Advanced Practice Providers (APPs -  Physician Assistants and Nurse Practitioners) who all work together to provide you with the care you need, when you need it.  We recommend signing up for the patient portal called "MyChart".  Sign up information is provided on this After Visit Summary.  MyChart is used to connect with patients for Virtual Visits (Telemedicine).  Patients are able to view lab/test results, encounter notes, upcoming appointments, etc.  Non-urgent messages can be sent to your provider as well.   To learn more about what you can do with MyChart, go to [email or web address].    Your next appointment:   6 month(s)  The format for your next appointment:   In Person  Provider:   Thomas Kelly, MD    

## 2020-03-24 ENCOUNTER — Encounter: Payer: Self-pay | Admitting: Cardiovascular Disease

## 2020-04-06 ENCOUNTER — Other Ambulatory Visit: Payer: Self-pay | Admitting: Gastroenterology

## 2020-04-06 ENCOUNTER — Other Ambulatory Visit: Payer: Self-pay | Admitting: Cardiovascular Disease

## 2020-04-09 ENCOUNTER — Other Ambulatory Visit: Payer: Self-pay

## 2020-04-09 ENCOUNTER — Ambulatory Visit (HOSPITAL_COMMUNITY)
Admission: RE | Admit: 2020-04-09 | Discharge: 2020-04-09 | Disposition: A | Discharge status: Home / Self Care | Payer: Medicare Other | Source: Ambulatory Visit | Attending: Gastroenterology | Admitting: Gastroenterology

## 2020-04-09 DIAGNOSIS — K831 Obstruction of bile duct: Secondary | ICD-10-CM

## 2020-04-09 DIAGNOSIS — Z9049 Acquired absence of other specified parts of digestive tract: Secondary | ICD-10-CM | POA: Diagnosis not present

## 2020-04-09 DIAGNOSIS — I700 Atherosclerosis of aorta: Secondary | ICD-10-CM | POA: Diagnosis not present

## 2020-04-09 DIAGNOSIS — K8070 Calculus of gallbladder and bile duct without cholecystitis without obstruction: Secondary | ICD-10-CM | POA: Diagnosis not present

## 2020-04-09 DIAGNOSIS — K8050 Calculus of bile duct without cholangitis or cholecystitis without obstruction: Secondary | ICD-10-CM | POA: Diagnosis not present

## 2020-04-09 LAB — POCT I-STAT CREATININE: Creatinine, Ser: 1.4 mg/dL — ABNORMAL HIGH (ref 0.61–1.24)

## 2020-04-09 MED ORDER — IOHEXOL 300 MG/ML  SOLN
100.0000 mL | Freq: Once | INTRAMUSCULAR | Status: AC | PRN
  Administered 2020-04-09: 100 mL via INTRAVENOUS

## 2020-04-24 ENCOUNTER — Ambulatory Visit: Payer: Medicare Other | Admitting: Gastroenterology

## 2020-04-24 ENCOUNTER — Telehealth: Payer: Self-pay

## 2020-04-24 ENCOUNTER — Encounter: Payer: Self-pay | Admitting: Gastroenterology

## 2020-04-24 VITALS — BP 112/60 | HR 53 | Ht 69.0 in | Wt 176.0 lb

## 2020-04-24 DIAGNOSIS — Z9889 Other specified postprocedural states: Secondary | ICD-10-CM | POA: Diagnosis not present

## 2020-04-24 DIAGNOSIS — R109 Unspecified abdominal pain: Secondary | ICD-10-CM | POA: Diagnosis not present

## 2020-04-24 DIAGNOSIS — R140 Abdominal distension (gaseous): Secondary | ICD-10-CM

## 2020-04-24 DIAGNOSIS — K8050 Calculus of bile duct without cholangitis or cholecystitis without obstruction: Secondary | ICD-10-CM | POA: Diagnosis not present

## 2020-04-24 DIAGNOSIS — R141 Gas pain: Secondary | ICD-10-CM

## 2020-04-24 MED ORDER — HYOSCYAMINE SULFATE 0.125 MG SL SUBL: 0.1250 mg | SUBLINGUAL_TABLET | SUBLINGUAL | 2 refills | Status: DC | PRN

## 2020-04-24 NOTE — Patient Instructions (Signed)
We have sent the following medications to your pharmacy for you to pick up at your convenience: Levsin  You have been given a testing kit to check for small intestine bacterial overgrowth (SIBO) which is completed by a company named [employer]. Make sure to return your test in the mail using the return mailing label given to you along with the kit. Your demographic and insurance information have already been sent to the company and they should be in contact with you over the next week regarding this test. [facility] will collect an upfront charge of $99.74 for commercial insurance plans and $209.74 is you are paying cash. Make sure to discuss with [facility] PRIOR to having the test if they have gotten informatoin from your insurance company as to how much your testing will cost out of pocket, if any. Please keep in mind that you will be getting a call from phone number [phone] or a similar number. If you do not hear from them within this time frame, please call our office at [phone].   You have been scheduled for an endoscopy. Please follow written instructions given to you at your visit today. If you use inhalers (even only as needed), please bring them with you on the day of your procedure.   Due to recent changes in healthcare laws, you may see the results of your imaging and laboratory studies on MyChart before your provider has had a chance to review them.  We understand that in some cases there may be results that are confusing or concerning to you. Not all laboratory results come back in the same time frame and the provider may be waiting for multiple results in order to interpret others.  Please give [place] 48 hours in order for your provider to thoroughly review all the results before contacting the office for clarification of your results.   Thank you for choosing me and [facility].  Dr. Rush Landmark

## 2020-04-24 NOTE — Telephone Encounter (Signed)
Request for surgical clearance:     Endoscopy Procedure  What type of surgery is being performed?  ERCP   When is this surgery scheduled? 06/03/20  What type of clearance is required ?   Pharmacy  Are there any medications that need to be held prior to surgery and how long? Plavix x5 days prior to procedure  Practice name and name of physician performing surgery?      [facility]-Dr.Mansouraty  What is your office phone and fax number?      Phone- [phone]  [phone]  Anesthesia type (None, local, MAC, general) ?       MAC

## 2020-04-24 NOTE — Telephone Encounter (Signed)
   Patient Name: [name]  DOB: November 30, 1940  MRN: [id number]   Primary Cardiologist: [name], MD  Chart reviewed as part of pre-operative protocol coverage. Patient is scheduled for ERCP on 06/03/2020 and we were asked to give our recommendations for holding Plavix. He was recently seen by Dr. Claiborne Billings on 03/22/2020 at which time he was feeling well and stable from a cardiac standpoint. He denied any chest pain and his breathing was better. Patient informed Dr. Claiborne Billings of upcoming CT imaging for further evaluation of his liver. Per Dr. Evette Georges note: " I will defer to the GI physician if Plavix needs to be held for the procedure and if necessary should be held for 5 days."   I will route this recommendation to the requesting party via [place] fax function and remove from pre-op pool.  Please call with questions.  Darreld Mclean, PA-C 04/24/2020, 3:14 PM

## 2020-04-26 NOTE — Progress Notes (Signed)
[place] VISIT   Primary Care Provider Isaac Bliss, Rayford Halsted, MD [place] [address] [phone]  Patient Profile: [name] is a 80 y.o. male with a pmh significant for CAD (status post PCI in 12/20), COPD, MDD, hypertension, hyperlipidemia, bladder cancer, sleep apnea, restless leg syndrome, Barrett's esophagus without dysplasia, chronic abdominal pain, prior pancreatitis secondary to gallstone disease, s/p CCK (cholelithiasis and choledocholithiasis), distal biliary stricture NOS, elevated IgG4.  The patient presents to the [facility] for an evaluation and management of problem(s) noted below:  Problem List 1. Choledocholithiasis   2. History of ERCP   3. Bloating   4. Gas pain   5. Abdominal cramping     History of Present Illness Please see initial consultation note and my prior progress notes for full details of HPI.   Interval History I most recently saw the patient for his follow-up ERCP.  Multiple stents were removed.  Significant choledocholithiasis and biliary sludge was removed.  Biliary dilatation performed of the distal duct with what looked to be adequate drainage and no further stone/debris was noted although the bile duct itself remains significantly dilated.  I proceeded with follow-up LFTs which showed normalization of all of his LFTs.  I also had the patient undergo a 6 pancreas protocol CT to ensure that we did not see any evidence of any abnormalities as had been quite a few years since any cross-sectional imaging had been performed.  The CT scan as noted below showed no evidence of a mass or lesion in the pancreas or bile duct.  However, within the biliary tree there looked to be a small calcified stone in the distal duct.  Patient continues to have his chronic abdominal discomforts that he has had for years as had been further explored by Dr. Havery Moros in the past without a clear  etiology other than being felt to be functional in origin.  He continues to have abdominal cramping.  Abdominal discomfort in the lower abdomen is beginning to cause him to have inability to do some things that he needs to and he is very concerned about this as he gets older.  No significant weight loss.  GI Review of Systems Positive as above including still having bloating Negative for pyrosis, dysphagia, odynophagia, melena, hematochezia, change in bowel habits   Review of Systems General: Denies fevers/chills/weight loss unintentionally Cardiovascular: Denies recurrent chest pain Pulmonary: Shortness of breath is stable Gastroenterological: See HPI Genitourinary: Denies darkened urine Hematological: Positive for easy bruising/bleeding due to chronic Plavix Dermatological: Denies jaundice Psychological: Mood is stable though he wonders if he will ever be pain-free    Medications Current Outpatient Medications  Medication Sig Dispense Refill  . acetaminophen (TYLENOL) 325 MG tablet Take 650 mg by mouth every 6 (six) hours as needed for mild pain.    [name] amLODipine (NORVASC) 2.5 MG tablet TAKE 2 TABLETS BY MOUTH DAILY (Patient taking differently: Take 5 mg by mouth daily.) 90 tablet 8  . aspirin EC 81 MG tablet Take 81 mg by mouth at bedtime.    [name] BYSTOLIC 10 MG tablet Take 1 tablet by mouth once daily (Patient taking differently: Take 10 mg by mouth daily.) 90 tablet 3  . cetirizine (ZYRTEC) 10 MG tablet Take 10 mg by mouth daily.    . clopidogrel (PLAVIX) 75 MG tablet Take 1 tablet by mouth once daily 90 tablet 3  . Coenzyme Q10 100 MG TABS Take 300 mg by mouth  at bedtime.    . cyanocobalamin (,VITAMIN B-12,) 1000 MCG/ML injection INJECT 1ML INTO THE MUSCLE FOR 1 DOSE (Patient taking differently: Inject 1,000 mcg into the skin every 30 (thirty) days.) 6 mL 0  . ezetimibe (ZETIA) 10 MG tablet Take 1 tablet (10 mg total) by mouth daily. 90 tablet 1  . ferrous sulfate 325 (65 FE) MG tablet  Take 325 mg by mouth daily with breakfast.    . Fiber Complete TABS Take 1 each by mouth daily. Miralax gummie    . fluticasone (FLONASE) 50 MCG/ACT nasal spray Place 1-2 sprays into both nostrils at bedtime as needed for allergies or rhinitis.    . furosemide (LASIX) 40 MG tablet Take 1 tablet (40 mg total) by mouth every other day. (Patient taking differently: Take 40 mg by mouth daily as needed for fluid or edema.) 90 tablet 1  . hyoscyamine (LEVSIN SL) 0.125 MG SL tablet Place 1 tablet (0.125 mg total) under the tongue every 4 (four) hours as needed. 30 tablet 2  . isosorbide mononitrate (IMDUR) 60 MG 24 hr tablet Take 1 tablet by mouth once daily (Patient taking differently: Take 60 mg by mouth daily.) 90 tablet 3  . Multiple Vitamins-Minerals (MULTIVITAMIN WITH MINERALS) tablet Take 1 tablet by mouth daily.    . Naphazoline-Pheniramine (OPCON-A) 0.027-0.315 % SOLN Place 1 drop into both eyes daily as needed (itching eyes).    . NEEDLE, DISP, 25 G (B-D DISP NEEDLE 25GX1") 25G X 1" MISC Inject 1000 mcg into muscle once a month. 50 each 0  . nitroGLYCERIN (NITROSTAT) 0.4 MG SL tablet DISSOLVE ONE TABLET UNDER THE TONGUE EVERY 5 MINUTES AS NEEDED FOR CHEST PAIN. (Patient taking differently: Place 0.4 mg under the tongue every 5 (five) minutes as needed for chest pain.) 25 tablet 0  . omeprazole (PRILOSEC OTC) 20 MG tablet Take 20 mg by mouth daily.    [name] oxyCODONE-acetaminophen (PERCOCET) 10-325 MG tablet Take 1 tablet by mouth 2 (two) times daily as needed for pain. (Patient taking differently: Take 0.5 tablets by mouth 2 (two) times daily as needed for pain.) 60 tablet 0  . Probiotic Product (PROBIOTIC ADVANCED PO) Take 1 tablet by mouth daily. Gummie    . rOPINIRole (REQUIP) 2 MG tablet TAKE 1 TABLET BY MOUTH AT BEDTIME (Patient taking differently: Take 2 mg by mouth at bedtime.) 90 tablet 0  . rosuvastatin (CRESTOR) 10 MG tablet Take 1 tablet (10 mg total) by mouth daily. 90 tablet 3  .  saccharomyces boulardii (FLORASTOR) 250 MG capsule Take 1 capsule (250 mg total) by mouth 2 (two) times daily. (Patient taking differently: Take 250 mg by mouth at bedtime.) 60 capsule 2  . venlafaxine (EFFEXOR) 75 MG tablet Take 1 tablet by mouth once daily (Patient taking differently: Take 75 mg by mouth daily.) 90 tablet 1   No current facility-administered medications for this visit.    Allergies Allergies  Allergen Reactions  . Losartan Potassium Other (See Comments)    Hyperkalemia  . Tape Itching and Rash    reddened skin     Histories Past Medical History:  Diagnosis Date  . Allergy   . B12 deficiency anemia   . Blood transfusion without reported diagnosis   . CAD (coronary artery disease)   . Cancer ([place])    bladder-   . Colon polyps   . COPD (chronic obstructive pulmonary disease) ([place])   . Depression   . Esophagus, Barrett's   . GERD (gastroesophageal reflux disease)   .  History of bladder cancer    Bladder cancer "8 times"  . History of hiatal hernia   . Hyperlipidemia   . Hypertension   . Localized osteoarthrosis, lower leg   . Restless leg syndrome   . Sleep apnea    does not wear cpap  . Stenosis of esophagus    Past Surgical History:  Procedure Laterality Date  . BILIARY BRUSHING  04/01/2018   Procedure: BILIARY BRUSHING;  Surgeon: Rush Landmark Telford Nab., MD;  Location: [place];  Service: Gastroenterology;;  . BILIARY BRUSHING  09/12/2018   Procedure: BILIARY BRUSHING;  Surgeon: Irving Copas., MD;  Location: [place];  Service: Gastroenterology;;  . BILIARY BRUSHING  11/28/2018   Procedure: BILIARY BRUSHING;  Surgeon: Irving Copas., MD;  Location: [place];  Service: Gastroenterology;;  . BILIARY BRUSHING  03/11/2020   Procedure: BILIARY BRUSHING;  Surgeon: Irving Copas., MD;  Location: [facility];  Service: Gastroenterology;;  . BILIARY DILATION  09/12/2018   Procedure: BILIARY DILATION;  Surgeon:  Irving Copas., MD;  Location: [place];  Service: Gastroenterology;;  . BILIARY DILATION  11/28/2018   Procedure: BILIARY DILATION;  Surgeon: Irving Copas., MD;  Location: [place];  Service: Gastroenterology;;  . BILIARY DILATION  03/11/2020   Procedure: BILIARY DILATION;  Surgeon: Irving Copas., MD;  Location: [facility];  Service: Gastroenterology;;  . BILIARY STENT PLACEMENT  04/01/2018   Procedure: BILIARY STENT PLACEMENT;  Surgeon: Irving Copas., MD;  Location: [place];  Service: Gastroenterology;;  . BILIARY STENT PLACEMENT  09/12/2018   Procedure: BILIARY STENT PLACEMENT;  Surgeon: Irving Copas., MD;  Location: [place];  Service: Gastroenterology;;  . BILIARY STENT PLACEMENT  11/28/2018   Procedure: BILIARY STENT PLACEMENT;  Surgeon: Irving Copas., MD;  Location: [place];  Service: Gastroenterology;;  . BIOPSY  04/01/2018   Procedure: BIOPSY;  Surgeon: Irving Copas., MD;  Location: [place];  Service: Gastroenterology;;  . BIOPSY  09/12/2018   Procedure: BIOPSY;  Surgeon: Irving Copas., MD;  Location: [place];  Service: Gastroenterology;;  . BIOPSY  11/28/2018   Procedure: BIOPSY;  Surgeon: Irving Copas., MD;  Location: [place];  Service: Gastroenterology;;  . BIOPSY  03/11/2020   Procedure: BIOPSY;  Surgeon: Irving Copas., MD;  Location: [facility];  Service: Gastroenterology;;  . bladder cancer      x 8 cystoscopy  . CERVICAL DISCECTOMY     ACDF  . COLONOSCOPY  11/17/2005   normal   . CORONARY ARTERY BYPASS GRAFT     x4  . CORONARY STENT INTERVENTION N/A 01/05/2019   Procedure: CORONARY STENT INTERVENTION;  Surgeon: Nelva Bush, MD;  Location: [facility];  Service: Cardiovascular;  Laterality: N/A;  . ENDOSCOPIC MUCOSAL RESECTION  09/12/2018   Procedure: ENDOSCOPIC MUCOSAL RESECTION;  Surgeon: Rush Landmark, Telford Nab., MD;  Location: [facility];  Service: Gastroenterology;;  . ENDOSCOPIC RETROGRADE CHOLANGIOPANCREATOGRAPHY (ERCP) WITH PROPOFOL N/A 04/01/2018   Procedure: ENDOSCOPIC RETROGRADE CHOLANGIOPANCREATOGRAPHY (ERCP) WITH PROPOFOL;  Surgeon: Irving Copas., MD;  Location: [place];  Service: Gastroenterology;  Laterality: N/A;  . ENDOSCOPIC RETROGRADE CHOLANGIOPANCREATOGRAPHY (ERCP) WITH PROPOFOL N/A 09/12/2018   Procedure: ENDOSCOPIC RETROGRADE CHOLANGIOPANCREATOGRAPHY (ERCP) WITH PROPOFOL;  Surgeon: Rush Landmark Telford Nab., MD;  Location: [place];  Service: Gastroenterology;  Laterality: N/A;  . ENDOSCOPIC RETROGRADE CHOLANGIOPANCREATOGRAPHY (ERCP) WITH PROPOFOL N/A 03/11/2020   Procedure: ENDOSCOPIC RETROGRADE CHOLANGIOPANCREATOGRAPHY (ERCP) WITH PROPOFOL;  Surgeon: Rush Landmark Telford Nab., MD;  Location: [facility];  Service: Gastroenterology;  Laterality: N/A;  . ERCP N/A 11/28/2018   Procedure: ENDOSCOPIC RETROGRADE CHOLANGIOPANCREATOGRAPHY (ERCP) +EGD with spyglass;  Surgeon: Rush Landmark Telford Nab., MD;  Location: [place];  Service: Gastroenterology;  Laterality: N/A;  . ESOPHAGOGASTRODUODENOSCOPY  04/29/2010  . ESOPHAGOGASTRODUODENOSCOPY (EGD) WITH PROPOFOL N/A 04/01/2018   Procedure: ESOPHAGOGASTRODUODENOSCOPY (EGD) WITH PROPOFOL;  Surgeon: Rush Landmark Telford Nab., MD;  Location: [place];  Service: Gastroenterology;  Laterality: N/A;  . ESOPHAGOGASTRODUODENOSCOPY (EGD) WITH PROPOFOL N/A 09/12/2018   Procedure: ESOPHAGOGASTRODUODENOSCOPY (EGD) WITH PROPOFOL;  Surgeon: Rush Landmark Telford Nab., MD;  Location: [place];  Service: Gastroenterology;  Laterality: N/A;  . ESOPHAGOGASTRODUODENOSCOPY (EGD) WITH PROPOFOL N/A 11/28/2018   Procedure: ESOPHAGOGASTRODUODENOSCOPY (EGD) WITH PROPOFOL;  Surgeon: Rush Landmark Telford Nab., MD;  Location: [place];  Service: Gastroenterology;  Laterality: N/A;  . ESOPHAGOGASTRODUODENOSCOPY (EGD) WITH PROPOFOL N/A 03/11/2020   Procedure:  ESOPHAGOGASTRODUODENOSCOPY (EGD) WITH PROPOFOL;  Surgeon: Rush Landmark Telford Nab., MD;  Location: [facility];  Service: Gastroenterology;  Laterality: N/A;  . EUS  04/01/2018   Procedure: FULL UPPER ENDOSCOPIC ULTRASOUND (EUS) RADIAL;  Surgeon: Irving Copas., MD;  Location: [place];  Service: Gastroenterology;;  . EUS N/A 09/12/2018   Procedure: UPPER ENDOSCOPIC ULTRASOUND (EUS) RADIAL;  Surgeon: Irving Copas., MD;  Location: [place];  Service: Gastroenterology;  Laterality: N/A;  . FINE NEEDLE ASPIRATION  09/12/2018   Procedure: FINE NEEDLE ASPIRATION (FNA) LINEAR;  Surgeon: Irving Copas., MD;  Location: [place];  Service: Gastroenterology;;  . HEMOSTASIS CLIP PLACEMENT  09/12/2018   Procedure: HEMOSTASIS CLIP PLACEMENT;  Surgeon: Irving Copas., MD;  Location: [facility];  Service: Gastroenterology;;  . I & D EXTREMITY Left 11/24/2016   Procedure: IRRIGATION AND DEBRIDEMENT LEFT HAND, THUMB, INDEX, MIDDLE, RING, AND SMALL FINGERS WITH RECONSTRUCTION;  Surgeon: Roseanne Kaufman, MD;  Location: [place];  Service: Orthopedics;  Laterality: Left;  . KNEE ARTHROSCOPY Left   . LEFT HEART CATH AND CORS/GRAFTS ANGIOGRAPHY N/A 01/05/2019   Procedure: LEFT HEART CATH AND CORS/GRAFTS ANGIOGRAPHY;  Surgeon: Nelva Bush, MD;  Location: [place] [facility];  Service: Cardiovascular;  Laterality: N/A;  . LUMBAR LAMINECTOMY     and fusion x 2  . NASAL SINUS SURGERY    . POPLITEAL SYNOVIAL CYST EXCISION    . REMOVAL OF STONES  04/01/2018   Procedure: REMOVAL OF STONES;  Surgeon: Rush Landmark Telford Nab., MD;  Location: [place];  Service: Gastroenterology;;  . REMOVAL OF STONES  09/12/2018   Procedure: REMOVAL OF STONES;  Surgeon: Irving Copas., MD;  Location: [place];  Service: Gastroenterology;;  . REMOVAL OF STONES  11/28/2018   Procedure: REMOVAL OF STONES;  Surgeon: Irving Copas., MD;  Location: [place];  Service:  Gastroenterology;;  . REMOVAL OF STONES  03/11/2020   Procedure: REMOVAL OF STONES;  Surgeon: Irving Copas., MD;  Location: [facility];  Service: Gastroenterology;;  . Azzie Almas DILATION N/A 09/12/2018   Procedure: Azzie Almas DILATION;  Surgeon: Irving Copas., MD;  Location: [place];  Service: Gastroenterology;  Laterality: N/A;  . SAVORY DILATION N/A 11/28/2018   Procedure: SAVORY DILATION;  Surgeon: Rush Landmark Telford Nab., MD;  Location: [place];  Service: Gastroenterology;  Laterality: N/A;  . SPHINCTEROTOMY  04/01/2018   Procedure: SPHINCTEROTOMY;  Surgeon: Rush Landmark Telford Nab., MD;  Location: [place];  Service: Gastroenterology;;  . Bess Kinds CHOLANGIOSCOPY N/A 11/28/2018   Procedure: KXFGHWEX CHOLANGIOSCOPY;  Surgeon: Irving Copas., MD;  Location: [place];  Service: Gastroenterology;  Laterality: N/A;  . STENT REMOVAL  09/12/2018   Procedure: STENT  REMOVAL;  Surgeon: Rush Landmark Telford Nab., MD;  Location: [place];  Service: Gastroenterology;;  . Lavell Islam REMOVAL  11/28/2018   Procedure: STENT REMOVAL;  Surgeon: Irving Copas., MD;  Location: [place];  Service: Gastroenterology;;  . Lavell Islam REMOVAL  03/11/2020   Procedure: STENT REMOVAL;  Surgeon: Irving Copas., MD;  Location: [facility];  Service: Gastroenterology;;  . Maryagnes Amos INJECTION  09/12/2018   Procedure: SUBMUCOSAL LIFTING INJECTION;  Surgeon: Irving Copas., MD;  Location: [place];  Service: Gastroenterology;;   Social History   Socioeconomic History  . Marital status: Married    Spouse name: Not on file  . Number of children: Not on file  . Years of education: Not on file  . Highest education level: Not on file  Occupational History  . Not on file  Tobacco Use  . Smoking status: Former Smoker    Packs/day: 0.50    Years: 5.00    Pack years: 2.50    Types: Cigarettes    Quit date: 03/30/1976    Years since quitting: 44.1  .  Smokeless tobacco: Never Used  Vaping Use  . Vaping Use: Never used  Substance and Sexual Activity  . Alcohol use: No    Alcohol/week: 0.0 standard drinks  . Drug use: No  . Sexual activity: Yes  Other Topics Concern  . Not on file  Social History Narrative  . Not on file   Social Determinants of Health   Financial Resource Strain: Low Risk   . Difficulty of Paying Living Expenses: Not hard at all  Food Insecurity: Not on file  Transportation Needs: No Transportation Needs  . Lack of Transportation (Medical): No  . Lack of Transportation (Non-Medical): No  Physical Activity: Not on file  Stress: Not on file  Social Connections: Not on file  Intimate Partner Violence: Not on file   Family History  Problem Relation Age of Onset  . Melanoma Mother   . Stroke Father   . Hypertension Father   . Coronary artery disease Other   . Colon cancer Neg Hx   . Esophageal cancer Neg Hx   . Stomach cancer Neg Hx   . Rectal cancer Neg Hx   . Pancreatic cancer Neg Hx   . Liver disease Neg Hx   . Inflammatory bowel disease Neg Hx    I have reviewed his medical, social, and family history in detail and updated the electronic medical record as necessary.    PHYSICAL EXAMINATION  BP 112/60   Pulse (!) 53   Ht 5\' 9"  (1.753 m)   Wt 176 lb (79.8 kg)   BMI 25.99 kg/m  GEN: NAD, appears stated age, doesn't appear chronically ill (actually looks stronger than my last visit with him a year ago) PSYCH: Cooperative, without pressured speech EYE: Conjunctivae pink, sclerae anicteric ENT: Masked CV: Nontachycardic RESP: No audible wheezing GI: NABS, soft, protuberant, mild tenderness to palpation in the lower abdomen, ND, without rebound or guarding MSK/EXT: Bilateral lower extremity edema SKIN: No jaundice, bruising noted from Plavix NEURO:  Alert & Oriented x 3, no focal deficits   REVIEW OF DATA  I reviewed the following data at the time of this encounter:  GI Procedures and  Studies  February 2022 ERCP - No gross lesions in esophagus proximally. Salmon-colored mucosal islands suspicious for short-segment Barrett's esophagus - biopsied. - Erythematous mucosa in the antrum. No other gross lesions in the stomach. Biopsied. - No gross lesions in the duodenal bulb,  in the first portion of the duodenum and in the second portion of the duodenum. - Prior biliary sphincterotomy appeared open. Three stents from the biliary tree were seen in the major papilla - these were removed and sent for cytology. - The fluoroscopic examination was suspicious for sludge as well as biliary dilation in the main duct. - A fine tapering was noted distally. Not clearly a stricture but could be narrowing. This  was dilated and brushed for cytology. - Choledocholithiasis was found. Complete removal was accomplished by sweeping. - At end of procedure, the biliary tree was swept and nothing was found. - Pull through of the 12 mm balloon through the entire duct was felt to be adequate so I did not replace the previous plastic stents for a trial of how the patient does without stenting in regards to LFT pattern and overall clinical scenario.  Laboratory Studies  Reviewed in epic  Imaging Studies  March 2022 CTAP IMPRESSION: Prior cholecystectomy. 6 mm calculus in the distal common bile duct. Mild biliary ductal dilatation, without significant change since prior exam. Aortic Atherosclerosis (ICD10-I70.0).   ASSESSMENT  Mr. Aderman is a 80 y.o. male with a pmh significant for CAD (status post PCI in 12/20), COPD, MDD, hypertension, hyperlipidemia, bladder cancer, sleep apnea, restless leg syndrome, Barrett's esophagus without dysplasia, chronic abdominal pain, prior pancreatitis secondary to gallstone disease, s/p CCK (cholelithiasis and choledocholithiasis), distal biliary stricture NOS, elevated IgG4.  The patient is seen today for evaluation and management of:  1. Choledocholithiasis   2.  History of ERCP   3. Bloating   4. Gas pain   5. Abdominal cramping    The patient is hemodynamically stable.  Clinically from a hepatobiliary perspective he is doing well.  However, most recent CT scan suggested a small stone still remains in the CBD.  ERCP is recommended for extraction.  This will need to be pursued.  Thankfully, his LFTs are normal.  May need to do spyglass cholangioscopy to ensure that everything is done at next ERCP.  Thankfully no imaging findings suggestive of a cancer or malignancy.  Patient continues to have his longer standing abdominal discomfort.  We will try antispasmodic in effort of trying to help patient.  SIBO breath testing will need to be considered.  After completion of extraction of [place], will consider repeating IgG4 to see if it remains elevated.  Since LFTs have normalized not sure that this is truly IgG4 related biliary stricturing but will need to keep this in mind.  We will obtain approval for cardiology hold of Plavix.  The risks of an ERCP were discussed at length, including but not limited to the risk of perforation, bleeding, abdominal pain, post-ERCP pancreatitis (while usually mild can be severe and even life threatening). The risks and benefits of endoscopic evaluation were discussed with the patient; these include but are not limited to the risk of perforation, infection, bleeding, missed lesions, lack of diagnosis, severe illness requiring hospitalization, as well as anesthesia and sedation related illnesses.  The patient is agreeable to proceed.  All patient questions were answered to the best of my ability, and the patient agrees to the aforementioned plan of action with follow-up as indicated.   PLAN  Consider SIBO breath testing in future Levsin 2-4 times daily as needed Repeat ERCP We will consider TCA in future  Repeat IgG4 to be considered in future   Orders Placed This Encounter  Procedures  . Procedural/ Surgical Case Request:  ENDOSCOPIC RETROGRADE CHOLANGIOPANCREATOGRAPHY (  ERCP) WITH PROPOFOL  . Ambulatory referral to Gastroenterology    New Prescriptions   HYOSCYAMINE (LEVSIN SL) 0.125 MG SL TABLET    Place 1 tablet (0.125 mg total) under the tongue every 4 (four) hours as needed.   Modified Medications   No medications on file    Planned Follow Up No follow-ups on file.   Total Time in Face-to-Face and in Coordination of Care for patient including independent/personal interpretation/review of prior testing, medical history, examination, medication adjustment, communicating results with the patient directly, and documentation with the EHR is 25 minutes.    Justice Britain, MD [facility] Office # [phone]

## 2020-04-27 ENCOUNTER — Encounter: Payer: Self-pay | Admitting: Gastroenterology

## 2020-04-27 DIAGNOSIS — R140 Abdominal distension (gaseous): Secondary | ICD-10-CM | POA: Insufficient documentation

## 2020-04-27 DIAGNOSIS — R141 Gas pain: Secondary | ICD-10-CM | POA: Insufficient documentation

## 2020-04-27 DIAGNOSIS — R109 Unspecified abdominal pain: Secondary | ICD-10-CM | POA: Insufficient documentation

## 2020-05-09 NOTE — Telephone Encounter (Signed)
Please see recommendations from Cardiology.

## 2020-05-09 NOTE — Telephone Encounter (Signed)
We will use this as well as our previous approval for 5-day hold of Plavix before his ERCP.  Thanks. GM

## 2020-05-10 NOTE — Telephone Encounter (Signed)
Left message for patient to return regarding Plavix hold prior to procedure. Will continue efforts.

## 2020-05-13 NOTE — Telephone Encounter (Signed)
Patient advised that he has been given clearance to hold Plavix 5 days prior to procedure scheduled for 06-03-2020.  Patient advised to take last dose of Plavix on 05-28-2020, and he will be advised when to restart Plavix by Dr Rush Landmark after the procedure.  Patient agreed to plan and verbalized understanding.  No further questions.

## 2020-05-17 ENCOUNTER — Other Ambulatory Visit: Payer: Self-pay | Admitting: Internal Medicine

## 2020-05-21 ENCOUNTER — Other Ambulatory Visit: Payer: Self-pay | Admitting: Cardiovascular Disease

## 2020-05-22 ENCOUNTER — Telehealth: Payer: Self-pay | Admitting: Pharmacist

## 2020-05-22 NOTE — Chronic Care Management (AMB) (Signed)
Chronic Care Management Pharmacy Assistant   Name: [name]  MRN: [id number] DOB: [birth year]  Reason for Encounter: Disease State/ General Assessment Call.   Conditions to be addressed/monitored: HTN and HLD  Recent office visits:  11/09/19 [name] MD (PCP) - started on furosemide 40mg  and discontinued hydrochlorothiazide 12.5mg . follow up in 2 weeks.  02/29/20 Estela Isaac Bliss MD (PCP) - refilled oxycodone-acetaminophen - 10-325mg . Follow up in 3 months. Will receive surgery for biliary stricture.   Recent consult visits:  11/09/19 Irving Copas. MD (Gastroenterolgy) - no medication changes. Seen for biliary stricture. No follow ups in file.  11/13/19 Coletta Memos NP (Cardiology) - no medication changes. Follow up with Dr. Claiborne Billings in 3 months.   01/26/20  Coletta Memos NP (Cardiology) - discontinued dicyclomine hcl 10mg  and doxycycline hyclate 100mg . Removed one of the oxycodone 10-325mg  tablet prescriptions. Follow up with Dr. Claiborne Billings in 3-4 months.   03/22/20 Troy Sine MD (Cardiology) - discontinued loratadine 10mg . Follow up in 6 months.   04/24/20 Irving Copas. MD (Gastroenterolgy) - patient was started on hycosamine sulfate 0.125mg . discontinued omeprazole 40mg . No follow ups on file.   Hospital visits:  Medication Reconciliation was completed by comparing discharge summary, patient's EMR and Pharmacy list, and upon discussion with patient.  Admitted to the hospital on 03/11/20 due to ENDOSCOPIC RETROGRADE CHOLANGIOPANCREATOGRAPHY (ERCP) WITH PROPOFOL . Discharge date was 03/11/20. Discharged from [facility]?Medications Started at [facility] Discharge:?? None.  Medication Changes at Hospital Discharge: None.   Medications Discontinued at Hospital Discharge: None.   Medications that remain the same after Hospital Discharge:??  -All other medications will remain the same.    Medications: Outpatient  Encounter Medications as of 05/22/2020  Medication Sig Note  . acetaminophen (TYLENOL) 325 MG tablet Take 650 mg by mouth every 6 (six) hours as needed for mild pain.   [name] amLODipine (NORVASC) 2.5 MG tablet TAKE 2 TABLETS BY MOUTH DAILY (Patient taking differently: Take 5 mg by mouth daily.)   . aspirin EC 81 MG tablet Take 81 mg by mouth at bedtime.   [name] BYSTOLIC 10 MG tablet Take 1 tablet by mouth once daily (Patient taking differently: Take 10 mg by mouth daily.)   . cetirizine (ZYRTEC) 10 MG tablet Take 10 mg by mouth daily.   . clopidogrel (PLAVIX) 75 MG tablet Take 1 tablet by mouth once daily   . Coenzyme Q10 100 MG TABS Take 300 mg by mouth at bedtime.   . cyanocobalamin (,VITAMIN B-12,) 1000 MCG/ML injection INJECT 1ML INTO THE MUSCLE FOR 1 DOSE (Patient taking differently: Inject 1,000 mcg into the skin every 30 (thirty) days.)   . ezetimibe (ZETIA) 10 MG tablet Take 1 tablet (10 mg total) by mouth daily.   . ferrous sulfate 325 (65 FE) MG tablet Take 325 mg by mouth daily with breakfast.   . Fiber Complete TABS Take 1 each by mouth daily. Miralax gummie 07/04/2019: Taking gummies.  . fluticasone (FLONASE) 50 MCG/ACT nasal spray Place 1-2 sprays into both nostrils at bedtime as needed for allergies or rhinitis.   . furosemide (LASIX) 40 MG tablet Take 1 tablet (40 mg total) by mouth every other day. (Patient taking differently: Take 40 mg by mouth daily as needed for fluid or edema.)   . hyoscyamine (LEVSIN SL) 0.125 MG SL tablet Place 1 tablet (0.125 mg total) under the tongue every 4 (four) hours as needed.   . isosorbide mononitrate (  IMDUR) 60 MG 24 hr tablet Take 1 tablet by mouth once daily (Patient taking differently: Take 60 mg by mouth daily.)   . Multiple Vitamins-Minerals (MULTIVITAMIN WITH MINERALS) tablet Take 1 tablet by mouth daily.   . Naphazoline-Pheniramine (OPCON-A) 0.027-0.315 % SOLN Place 1 drop into both eyes daily as needed (itching eyes).   . NEEDLE, DISP, 25 G (B-D  DISP NEEDLE 25GX1") 25G X 1" MISC Inject 1000 mcg into muscle once a month.   . nitroGLYCERIN (NITROSTAT) 0.4 MG SL tablet DISSOLVE ONE TABLET UNDER THE TONGUE EVERY 5 MINUTES AS NEEDED FOR CHEST PAIN. (Patient taking differently: Place 0.4 mg under the tongue every 5 (five) minutes as needed for chest pain.)   . omeprazole (PRILOSEC OTC) 20 MG tablet Take 20 mg by mouth daily.   [name] oxyCODONE-acetaminophen (PERCOCET) 10-325 MG tablet Take 1 tablet by mouth 2 (two) times daily as needed for pain. (Patient taking differently: Take 0.5 tablets by mouth 2 (two) times daily as needed for pain.)   . Probiotic Product (PROBIOTIC ADVANCED PO) Take 1 tablet by mouth daily. Gummie   . rOPINIRole (REQUIP) 2 MG tablet TAKE 1 TABLET BY MOUTH AT BEDTIME   . rosuvastatin (CRESTOR) 10 MG tablet Take 1 tablet (10 mg total) by mouth daily.   [name] saccharomyces boulardii (FLORASTOR) 250 MG capsule Take 1 capsule (250 mg total) by mouth 2 (two) times daily. (Patient taking differently: Take 250 mg by mouth at bedtime.)   . venlafaxine (EFFEXOR) 75 MG tablet Take 1 tablet by mouth once daily (Patient taking differently: Take 75 mg by mouth daily.)    No facility-administered encounter medications on file as of 05/22/2020.    Reviewed chart prior to disease state call. Spoke with patient regarding BP  Recent Office Vitals: BP Readings from Last 3 Encounters:  04/24/20 112/60  03/22/20 116/64  03/11/20 (!) 155/71   Pulse Readings from Last 3 Encounters:  04/24/20 (!) 53  03/22/20 (!) 59  03/11/20 (!) 58    Wt Readings from Last 3 Encounters:  04/24/20 176 lb (79.8 kg)  03/22/20 171 lb 3.2 oz (77.7 kg)  03/11/20 165 lb (74.8 kg)     Kidney Function Lab Results  Component Value Date/Time   CREATININE 1.40 (H) 04/09/2020 11:01 AM   CREATININE 1.62 (H) 11/13/2019 10:06 AM   CREATININE 1.61 (H) 11/09/2019 09:39 AM   CREATININE 1.17 12/26/2015 10:34 AM   GFR 41.03 (L) 07/27/2018 04:18 PM   GFRNONAA 40 (L)  11/13/2019 10:06 AM   GFRAA 47 (L) 11/13/2019 10:06 AM    BMP Latest Ref Rng & Units 04/09/2020 11/13/2019 11/09/2019  Glucose 65 - 99 mg/dL - 138(H) 134(H)  BUN 8 - 27 mg/dL - 23 25  Creatinine 0.61 - 1.24 mg/dL 1.40(H) 1.62(H) 1.61(H)  BUN/Creat Ratio 10 - 24 - 14 16  Sodium 134 - 144 mmol/L - 145(H) 143  Potassium 3.5 - 5.2 mmol/L - 4.4 4.5  Chloride 96 - 106 mmol/L - 101 108  CO2 20 - 29 mmol/L - 30(H) 30  Calcium 8.6 - 10.2 mg/dL - 9.1 8.8    . Current antihypertensive regimen:  o Amlodipine 2.5mg   - take 2 tablets by mouth daily.  o Bystolic 10mg   - take one tablet once daily.  . How often are you checking your Blood Pressure? daily as of lately.   . Current home BP readings: 116/62 on 05/27/20, 132/61 on 05/26/20, 121/66 on 05/25/20.  . What recent interventions/DTPs have been made  by any provider to improve Blood Pressure control since last CPP Visit: None.   . Any recent hospitalizations or ED visits since last visit with CPP? No. Patient only had a surgery.  . What diet changes have been made to improve Blood Pressure Control?  o Patients daughter [name] states that he eats what he wants.   . What exercise is being done to improve your Blood Pressure Control?  o Patients daughter [name] states that patient gets activity daily. He does his own yard work and anything around the house that needs done. She states that patient is always coming up with a project to do.   Adherence Review: Is the patient currently on ACE/ARB medication? No Does the patient have >5 day gap between last estimated fill dates? No   Comprehensive medication review performed; Spoke to patient regarding cholesterol  Lipid Panel    Component Value Date/Time   CHOL 113 07/20/2019 0850   CHOL 116 03/30/2013 1053   TRIG 145 07/20/2019 0850   TRIG 80 03/30/2013 1053   HDL 32 (L) 07/20/2019 0850   HDL 34 (L) 03/30/2013 1053   LDLCALC 56 07/20/2019 0850   LDLCALC 66 03/30/2013 1053   LDLDIRECT 90.3  04/17/2008 0911    10-year ASCVD risk score: The ASCVD Risk score Mikey Bussing DC Jr., et al., 2013) failed to calculate for the following reasons:   The patient has a prior MI or stroke diagnosis  . Current antihyperlipidemic regimen:  o Ezetimibe 10mg  - take once daily.  o Rosuvastatin 10mg  - take once daily.  . Previous antihyperlipidemic medications tried: atorvastatin and simvastatin.   [name] ASCVD risk enhancing conditions: age >25, DM, CHF and current smoker.  . What recent interventions/DTPs have been made by any provider to improve Cholesterol control since last CPP Visit: None.   . Any recent hospitalizations or ED visits since last visit with CPP? No  . What diet changes have been made to improve Cholesterol?  o Patients daughter [name] states that patient eats what he wants.   . What exercise is being done to improve Cholesterol?  o Patients daughter [name] states that patient gets activity on a daily basis. He mows and does all of the lawn work. Patient is able to do anything around the house that needs to be done. [name] states that patient is always coming up with a project to do.  Adherence Review: Does the patient have >5 day gap between last estimated fill dates? No    Star Rating Drugs:  Rosuvastatin 10mg  - last filled on 04/21/20 90DS at [facility].  [place] [phone]

## 2020-05-23 NOTE — Telephone Encounter (Cosign Needed)
Call 5/6

## 2020-05-28 ENCOUNTER — Other Ambulatory Visit: Payer: Self-pay

## 2020-05-29 ENCOUNTER — Encounter (HOSPITAL_COMMUNITY): Payer: Self-pay | Admitting: Gastroenterology

## 2020-05-29 ENCOUNTER — Ambulatory Visit (INDEPENDENT_AMBULATORY_CARE_PROVIDER_SITE_OTHER): Payer: Medicare Other | Admitting: Internal Medicine

## 2020-05-29 ENCOUNTER — Encounter: Payer: Self-pay | Admitting: Internal Medicine

## 2020-05-29 ENCOUNTER — Other Ambulatory Visit: Payer: Self-pay

## 2020-05-29 VITALS — BP 124/64 | HR 57 | Temp 98.1°F | Wt 172.6 lb

## 2020-05-29 DIAGNOSIS — E119 Type 2 diabetes mellitus without complications: Secondary | ICD-10-CM

## 2020-05-29 DIAGNOSIS — G894 Chronic pain syndrome: Secondary | ICD-10-CM | POA: Diagnosis not present

## 2020-05-29 DIAGNOSIS — E785 Hyperlipidemia, unspecified: Secondary | ICD-10-CM | POA: Diagnosis not present

## 2020-05-29 DIAGNOSIS — K8050 Calculus of bile duct without cholangitis or cholecystitis without obstruction: Secondary | ICD-10-CM

## 2020-05-29 LAB — POCT GLYCOSYLATED HEMOGLOBIN (HGB A1C): Hemoglobin A1C: 5.8 % — AB (ref 4.0–5.6)

## 2020-05-29 NOTE — Patient Instructions (Signed)
-  Nice seeing you today!!  -Schedule follow up in 3 months. 

## 2020-05-29 NOTE — Progress Notes (Signed)
Established Patient Office Visit     This visit occurred during the SARS-CoV-2 public health emergency.  Safety protocols were in place, including screening questions prior to the visit, additional usage of staff PPE, and extensive cleaning of exam room while observing appropriate contact time as indicated for disinfecting solutions.    CC/Reason for Visit: Follow-up chronic medical conditions  HPI: [name] is a 80 y.o. male who is coming in today for the above mentioned reasons. Past Medical History is significant for: Type 2 diabetes that has been well controlled, chronic diastolic heart failure followed by cardiology as well as chronic pain syndrome on oxycodone.  He has had a history of a sending cholangitis and pancreatitis due to biliary strictures.  On his most recent imaging he was found to have choledocholithiasis.  Dr. Rush Landmark plans on repeat ERCP on 16 May.  He complains of continued significant pain all over.  It is not controlled with his oxycodone of which he takes half a tablet 3 times a day.  He has seen different specialists including rheumatology, PMR, orthopedics without relief.  His cardiologist decreased the dose of his statin but this did not relieve his pain.  He says the pain is unbearable.  The narcotics are enough to "get me out of bed".  The pain is so severe that sometimes "I cannot think or breathe".   Past Medical/Surgical History: Past Medical History:  Diagnosis Date  . Allergy   . B12 deficiency anemia   . Blood transfusion without reported diagnosis   . CAD (coronary artery disease)   . Cancer ([place])    bladder-   . Colon polyps   . COPD (chronic obstructive pulmonary disease) ([place])   . Depression   . Esophagus, Barrett's   . GERD (gastroesophageal reflux disease)   . History of bladder cancer    Bladder cancer "8 times"  . History of hiatal hernia   . Hyperlipidemia   . Hypertension   . Localized osteoarthrosis, lower leg   .  Restless leg syndrome   . Sleep apnea    does not wear cpap  . Stenosis of esophagus     Past Surgical History:  Procedure Laterality Date  . BILIARY BRUSHING  04/01/2018   Procedure: BILIARY BRUSHING;  Surgeon: Rush Landmark Telford Nab., MD;  Location: [place];  Service: Gastroenterology;;  . BILIARY BRUSHING  09/12/2018   Procedure: BILIARY BRUSHING;  Surgeon: Irving Copas., MD;  Location: [place];  Service: Gastroenterology;;  . BILIARY BRUSHING  11/28/2018   Procedure: BILIARY BRUSHING;  Surgeon: Irving Copas., MD;  Location: [place];  Service: Gastroenterology;;  . BILIARY BRUSHING  03/11/2020   Procedure: BILIARY BRUSHING;  Surgeon: Irving Copas., MD;  Location: [facility];  Service: Gastroenterology;;  . BILIARY DILATION  09/12/2018   Procedure: BILIARY DILATION;  Surgeon: Irving Copas., MD;  Location: [place];  Service: Gastroenterology;;  . BILIARY DILATION  11/28/2018   Procedure: BILIARY DILATION;  Surgeon: Irving Copas., MD;  Location: [place];  Service: Gastroenterology;;  . BILIARY DILATION  03/11/2020   Procedure: BILIARY DILATION;  Surgeon: Irving Copas., MD;  Location: [facility];  Service: Gastroenterology;;  . BILIARY STENT PLACEMENT  04/01/2018   Procedure: BILIARY STENT PLACEMENT;  Surgeon: Irving Copas., MD;  Location: [place];  Service: Gastroenterology;;  . BILIARY STENT PLACEMENT  09/12/2018   Procedure: BILIARY STENT PLACEMENT;  Surgeon: Irving Copas., MD;  Location: [place];  Service: Gastroenterology;;  . BILIARY STENT PLACEMENT  11/28/2018   Procedure: BILIARY STENT PLACEMENT;  Surgeon: Irving Copas., MD;  Location: [place];  Service: Gastroenterology;;  . BIOPSY  04/01/2018   Procedure: BIOPSY;  Surgeon: Irving Copas., MD;  Location: [place];  Service: Gastroenterology;;  . BIOPSY  09/12/2018   Procedure: BIOPSY;  Surgeon:  Irving Copas., MD;  Location: [place];  Service: Gastroenterology;;  . BIOPSY  11/28/2018   Procedure: BIOPSY;  Surgeon: Irving Copas., MD;  Location: [place];  Service: Gastroenterology;;  . BIOPSY  03/11/2020   Procedure: BIOPSY;  Surgeon: Irving Copas., MD;  Location: [facility];  Service: Gastroenterology;;  . bladder cancer      x 8 cystoscopy  . CERVICAL DISCECTOMY     ACDF  . COLONOSCOPY  11/17/2005   normal   . CORONARY ARTERY BYPASS GRAFT     x4  . CORONARY STENT INTERVENTION N/A 01/05/2019   Procedure: CORONARY STENT INTERVENTION;  Surgeon: Nelva Bush, MD;  Location: [place] [facility];  Service: Cardiovascular;  Laterality: N/A;  . ENDOSCOPIC MUCOSAL RESECTION  09/12/2018   Procedure: ENDOSCOPIC MUCOSAL RESECTION;  Surgeon: Rush Landmark, Telford Nab., MD;  Location: [facility];  Service: Gastroenterology;;  . ENDOSCOPIC RETROGRADE CHOLANGIOPANCREATOGRAPHY (ERCP) WITH PROPOFOL N/A 04/01/2018   Procedure: ENDOSCOPIC RETROGRADE CHOLANGIOPANCREATOGRAPHY (ERCP) WITH PROPOFOL;  Surgeon: Irving Copas., MD;  Location: [place];  Service: Gastroenterology;  Laterality: N/A;  . ENDOSCOPIC RETROGRADE CHOLANGIOPANCREATOGRAPHY (ERCP) WITH PROPOFOL N/A 09/12/2018   Procedure: ENDOSCOPIC RETROGRADE CHOLANGIOPANCREATOGRAPHY (ERCP) WITH PROPOFOL;  Surgeon: Rush Landmark Telford Nab., MD;  Location: [place];  Service: Gastroenterology;  Laterality: N/A;  . ENDOSCOPIC RETROGRADE CHOLANGIOPANCREATOGRAPHY (ERCP) WITH PROPOFOL N/A 03/11/2020   Procedure: ENDOSCOPIC RETROGRADE CHOLANGIOPANCREATOGRAPHY (ERCP) WITH PROPOFOL;  Surgeon: Rush Landmark Telford Nab., MD;  Location: [facility];  Service: Gastroenterology;  Laterality: N/A;  . ERCP N/A 11/28/2018   Procedure: ENDOSCOPIC RETROGRADE CHOLANGIOPANCREATOGRAPHY (ERCP) +EGD with spyglass;  Surgeon: Rush Landmark Telford Nab., MD;  Location: [place];  Service: Gastroenterology;  Laterality: N/A;   . ESOPHAGOGASTRODUODENOSCOPY  04/29/2010  . ESOPHAGOGASTRODUODENOSCOPY (EGD) WITH PROPOFOL N/A 04/01/2018   Procedure: ESOPHAGOGASTRODUODENOSCOPY (EGD) WITH PROPOFOL;  Surgeon: Rush Landmark Telford Nab., MD;  Location: [place];  Service: Gastroenterology;  Laterality: N/A;  . ESOPHAGOGASTRODUODENOSCOPY (EGD) WITH PROPOFOL N/A 09/12/2018   Procedure: ESOPHAGOGASTRODUODENOSCOPY (EGD) WITH PROPOFOL;  Surgeon: Rush Landmark Telford Nab., MD;  Location: [place];  Service: Gastroenterology;  Laterality: N/A;  . ESOPHAGOGASTRODUODENOSCOPY (EGD) WITH PROPOFOL N/A 11/28/2018   Procedure: ESOPHAGOGASTRODUODENOSCOPY (EGD) WITH PROPOFOL;  Surgeon: Rush Landmark Telford Nab., MD;  Location: [place];  Service: Gastroenterology;  Laterality: N/A;  . ESOPHAGOGASTRODUODENOSCOPY (EGD) WITH PROPOFOL N/A 03/11/2020   Procedure: ESOPHAGOGASTRODUODENOSCOPY (EGD) WITH PROPOFOL;  Surgeon: Rush Landmark Telford Nab., MD;  Location: [facility];  Service: Gastroenterology;  Laterality: N/A;  . EUS  04/01/2018   Procedure: FULL UPPER ENDOSCOPIC ULTRASOUND (EUS) RADIAL;  Surgeon: Irving Copas., MD;  Location: [place];  Service: Gastroenterology;;  . EUS N/A 09/12/2018   Procedure: UPPER ENDOSCOPIC ULTRASOUND (EUS) RADIAL;  Surgeon: Irving Copas., MD;  Location: [place];  Service: Gastroenterology;  Laterality: N/A;  . FINE NEEDLE ASPIRATION  09/12/2018   Procedure: FINE NEEDLE ASPIRATION (FNA) LINEAR;  Surgeon: Irving Copas., MD;  Location: [place];  Service: Gastroenterology;;  . HEMOSTASIS CLIP PLACEMENT  09/12/2018   Procedure: HEMOSTASIS CLIP PLACEMENT;  Surgeon: Irving Copas., MD;  Location: [place];  Service: Gastroenterology;;  . I & D EXTREMITY Left 11/24/2016  Procedure: IRRIGATION AND DEBRIDEMENT LEFT HAND, THUMB, INDEX, MIDDLE, RING, AND SMALL FINGERS WITH RECONSTRUCTION;  Surgeon: Roseanne Kaufman, MD;  Location: [place];  Service: Orthopedics;  Laterality:  Left;  . KNEE ARTHROSCOPY Left   . LEFT HEART CATH AND CORS/GRAFTS ANGIOGRAPHY N/A 01/05/2019   Procedure: LEFT HEART CATH AND CORS/GRAFTS ANGIOGRAPHY;  Surgeon: Nelva Bush, MD;  Location: [place] [facility];  Service: Cardiovascular;  Laterality: N/A;  . LUMBAR LAMINECTOMY     and fusion x 2  . NASAL SINUS SURGERY    . POPLITEAL SYNOVIAL CYST EXCISION    . REMOVAL OF STONES  04/01/2018   Procedure: REMOVAL OF STONES;  Surgeon: Rush Landmark Telford Nab., MD;  Location: [place];  Service: Gastroenterology;;  . REMOVAL OF STONES  09/12/2018   Procedure: REMOVAL OF STONES;  Surgeon: Irving Copas., MD;  Location: [place];  Service: Gastroenterology;;  . REMOVAL OF STONES  11/28/2018   Procedure: REMOVAL OF STONES;  Surgeon: Irving Copas., MD;  Location: [place];  Service: Gastroenterology;;  . REMOVAL OF STONES  03/11/2020   Procedure: REMOVAL OF STONES;  Surgeon: Irving Copas., MD;  Location: [facility];  Service: Gastroenterology;;  . Azzie Almas DILATION N/A 09/12/2018   Procedure: Azzie Almas DILATION;  Surgeon: Irving Copas., MD;  Location: [place];  Service: Gastroenterology;  Laterality: N/A;  . SAVORY DILATION N/A 11/28/2018   Procedure: SAVORY DILATION;  Surgeon: Rush Landmark Telford Nab., MD;  Location: [place];  Service: Gastroenterology;  Laterality: N/A;  . SPHINCTEROTOMY  04/01/2018   Procedure: SPHINCTEROTOMY;  Surgeon: Rush Landmark Telford Nab., MD;  Location: [place];  Service: Gastroenterology;;  . Bess Kinds CHOLANGIOSCOPY N/A 11/28/2018   Procedure: GHWEXHBZ CHOLANGIOSCOPY;  Surgeon: Irving Copas., MD;  Location: [place];  Service: Gastroenterology;  Laterality: N/A;  . STENT REMOVAL  09/12/2018   Procedure: STENT REMOVAL;  Surgeon: Irving Copas., MD;  Location: [place];  Service: Gastroenterology;;  . Lavell Islam REMOVAL  11/28/2018   Procedure: STENT REMOVAL;  Surgeon: Irving Copas., MD;   Location: [place];  Service: Gastroenterology;;  . Lavell Islam REMOVAL  03/11/2020   Procedure: STENT REMOVAL;  Surgeon: Irving Copas., MD;  Location: [facility];  Service: Gastroenterology;;  . Lia Foyer LIFTING INJECTION  09/12/2018   Procedure: SUBMUCOSAL LIFTING INJECTION;  Surgeon: Irving Copas., MD;  Location: [place];  Service: Gastroenterology;;    Social History:  reports that he quit smoking about 44 years ago. His smoking use included cigarettes. He has a 2.50 pack-year smoking history. He has never used smokeless tobacco. He reports that he does not drink alcohol and does not use drugs.  Allergies: Allergies  Allergen Reactions  . Losartan Potassium Other (See Comments)    Hyperkalemia  . Tape Itching and Rash    reddened skin     Family History:  Family History  Problem Relation Age of Onset  . Melanoma Mother   . Stroke Father   . Hypertension Father   . Coronary artery disease Other   . Colon cancer Neg Hx   . Esophageal cancer Neg Hx   . Stomach cancer Neg Hx   . Rectal cancer Neg Hx   . Pancreatic cancer Neg Hx   . Liver disease Neg Hx   . Inflammatory bowel disease Neg Hx      Current Outpatient Medications:  .  acetaminophen (TYLENOL) 500 MG tablet, Take 1,000 mg by mouth every 6 (six) hours as needed for moderate pain., Disp: , Rfl:  .  amLODipine (NORVASC) 2.5 MG tablet, TAKE 2 TABLETS BY MOUTH DAILY (Patient taking differently: Take 5 mg by mouth daily.), Disp: 90 tablet, Rfl: 8 .  aspirin EC 81 MG tablet, Take 81 mg by mouth every evening., Disp: , Rfl:  .  BYSTOLIC 10 MG tablet, Take 1 tablet by mouth once daily (Patient taking differently: Take 10 mg by mouth daily.), Disp: 90 tablet, Rfl: 3 .  cetirizine (ZYRTEC) 10 MG tablet, Take 10 mg by mouth daily as needed for allergies., Disp: , Rfl:  .  clopidogrel (PLAVIX) 75 MG tablet, Take 1 tablet by mouth once daily (Patient taking differently: Take 75 mg by mouth daily.),  Disp: 90 tablet, Rfl: 3 .  Coenzyme Q10 300 MG CAPS, Take 300 mg by mouth every evening., Disp: , Rfl:  .  cyanocobalamin (,VITAMIN B-12,) 1000 MCG/ML injection, INJECT 1ML INTO THE MUSCLE FOR 1 DOSE (Patient taking differently: Inject 1,000 mcg into the skin every 30 (thirty) days.), Disp: 6 mL, Rfl: 0 .  ezetimibe (ZETIA) 10 MG tablet, Take 1 tablet (10 mg total) by mouth daily., Disp: 90 tablet, Rfl: 1 .  Ferrous Sulfate (IRON) 28 MG TABS, Take 28 mg by mouth daily., Disp: , Rfl:  .  fluticasone (FLONASE) 50 MCG/ACT nasal spray, Place 1-2 sprays into both nostrils at bedtime as needed for allergies or rhinitis., Disp: , Rfl:  .  furosemide (LASIX) 40 MG tablet, Take 1 tablet (40 mg total) by mouth every other day. (Patient taking differently: Take 40 mg by mouth daily as needed for fluid or edema.), Disp: 90 tablet, Rfl: 1 .  guaifenesin (HUMIBID E) 400 MG TABS tablet, Take 400 mg by mouth daily., Disp: , Rfl:  .  hyoscyamine (LEVSIN SL) 0.125 MG SL tablet, Place 1 tablet (0.125 mg total) under the tongue every 4 (four) hours as needed. (Patient taking differently: Place 0.125 mg under the tongue every 4 (four) hours as needed (spasms).), Disp: 30 tablet, Rfl: 2 .  isosorbide mononitrate (IMDUR) 60 MG 24 hr tablet, Take 1 tablet by mouth once daily (Patient taking differently: Take 60 mg by mouth daily.), Disp: 90 tablet, Rfl: 3 .  Menthol, Topical Analgesic, (BENGAY EX), Apply 1 application topically daily as needed (pain)., Disp: , Rfl:  .  METAMUCIL FIBER PO, Take 3 capsules by mouth daily., Disp: , Rfl:  .  Multiple Vitamins-Minerals (MULTIVITAMIN WITH MINERALS) tablet, Take 1 tablet by mouth daily., Disp: , Rfl:  .  Naphazoline-Pheniramine (OPCON-A) 0.027-0.315 % SOLN, Place 1 drop into both eyes daily as needed (itching eyes)., Disp: , Rfl:  .  NEEDLE, DISP, 25 G (B-D DISP NEEDLE 25GX1") 25G X 1" MISC, Inject 1000 mcg into muscle once a month., Disp: 50 each, Rfl: 0 .  nitroGLYCERIN  (NITROSTAT) 0.4 MG SL tablet, DISSOLVE ONE TABLET UNDER THE TONGUE EVERY 5 MINUTES AS NEEDED FOR CHEST PAIN. (Patient taking differently: Place 0.4 mg under the tongue every 5 (five) minutes as needed for chest pain.), Disp: 25 tablet, Rfl: 0 .  omeprazole (PRILOSEC) 40 MG capsule, Take 40 mg by mouth every evening., Disp: , Rfl:  .  oxyCODONE-acetaminophen (PERCOCET) 10-325 MG tablet, Take 1 tablet by mouth 2 (two) times daily as needed for pain. (Patient taking differently: Take 0.5 tablets by mouth 2 (two) times daily as needed for pain.), Disp: 60 tablet, Rfl: 0 .  Probiotic Product (PROBIOTIC ADVANCED PO), Take 1 tablet by mouth every evening., Disp: , Rfl:  .  rOPINIRole (REQUIP) 2 MG  tablet, TAKE 1 TABLET BY MOUTH AT BEDTIME (Patient taking differently: Take 2 mg by mouth at bedtime.), Disp: 90 tablet, Rfl: 0 .  rosuvastatin (CRESTOR) 10 MG tablet, Take 1 tablet (10 mg total) by mouth daily., Disp: 90 tablet, Rfl: 3 .  venlafaxine (EFFEXOR) 75 MG tablet, Take 1 tablet by mouth once daily (Patient taking differently: Take 75 mg by mouth daily.), Disp: 90 tablet, Rfl: 1  Review of Systems:  Constitutional: Denies fever, chills, diaphoresis, appetite change and fatigue.  HEENT: Denies photophobia, eye pain, redness, hearing loss, ear pain, congestion, sore throat, rhinorrhea, sneezing, mouth sores, trouble swallowing, neck pain, neck stiffness and tinnitus.   Respiratory: Denies SOB, DOE, cough, chest tightness,  and wheezing.   Cardiovascular: Denies chest pain, palpitations and leg swelling.  Gastrointestinal: Denies nausea, vomiting, abdominal pain, diarrhea, constipation, blood in stool and abdominal distention.  Genitourinary: Denies dysuria, urgency, frequency, hematuria, flank pain and difficulty urinating.  Endocrine: Denies: hot or cold intolerance, sweats, changes in hair or nails, polyuria, polydipsia. Musculoskeletal: Positive for myalgias, back pain, joint swelling, arthralgias and  gait problem.  Skin: Denies pallor, rash and wound.  Neurological: Denies dizziness, seizures, syncope, weakness, light-headedness, numbness and headaches.  Hematological: Denies adenopathy. Easy bruising, personal or family bleeding history  Psychiatric/Behavioral: Denies suicidal ideation, mood changes, confusion, nervousness, sleep disturbance and agitation    Physical Exam: Vitals:   05/29/20 1006  BP: 124/64  Pulse: (!) 57  Temp: 98.1 F (36.7 C)  TempSrc: Oral  SpO2: 97%  Weight: 172 lb 9.6 oz (78.3 kg)    Body mass index is 25.49 kg/m.  Constitutional: NAD, calm, comfortable Eyes: PERRL, lids and conjunctivae normal ENMT: Mucous membranes are moist. Respiratory: clear to auscultation bilaterally, no wheezing, no crackles. Normal respiratory effort. No accessory muscle use.  Cardiovascular: Regular rate and rhythm, no murmurs / rubs / gallops. No extremity edema.  Psychiatric: Normal judgment and insight. Alert and oriented x 3. Normal mood.    Impression and Plan:  Diabetes mellitus type 2, noninsulin dependent ([place])  -A1c today demonstrates good control at 5.8.  Hyperlipidemia, unspecified hyperlipidemia type -Last cholesterol panel in July 2021 showed a total cholesterol of 113 with an LDL of 56 and triglycerides of 145. -He is on ezetimibe and rosuvastatin 10 mg daily.  Choledocholithiasis -Going for ERCP next week.  Chronic pain syndrome -He has prescribed 60 tablets of oxycodone a month. -He has been advised to try taking a full tablet in the morning, and then half tablets at noon and bedtime. -He declines referral to other specialties at the moment.   Patient Instructions  -Nice seeing you today!!  -Schedule follow up in 3 months.     [name], MD [facility]

## 2020-05-30 ENCOUNTER — Other Ambulatory Visit (HOSPITAL_COMMUNITY)
Admission: RE | Admit: 2020-05-30 | Discharge: 2020-05-30 | Disposition: A | Discharge status: Home / Self Care | Payer: Medicare Other | Source: Ambulatory Visit | Attending: Gastroenterology | Admitting: Gastroenterology

## 2020-05-30 DIAGNOSIS — Z20822 Contact with and (suspected) exposure to covid-19: Secondary | ICD-10-CM | POA: Diagnosis not present

## 2020-05-30 DIAGNOSIS — Z01812 Encounter for preprocedural laboratory examination: Secondary | ICD-10-CM | POA: Insufficient documentation

## 2020-05-31 LAB — SARS CORONAVIRUS 2 (TAT 6-24 HRS): SARS Coronavirus 2: NEGATIVE

## 2020-06-02 MED ORDER — SODIUM CHLORIDE 0.9 % IV SOLN
1.5000 g | INTRAVENOUS | Status: DC
  Filled 2020-06-02: qty 4

## 2020-06-02 NOTE — Anesthesia Preprocedure Evaluation (Addendum)
Anesthesia Evaluation  Patient identified by MRN, date of birth, ID band Patient awake    Reviewed: Allergy & Precautions, NPO status , Patient's Chart, lab work & pertinent test results  Airway Mallampati: II  TM Distance: >3 FB Neck ROM: Full    Dental  (+) Edentulous Upper, Missing, Chipped, Dental Advisory Given,    Pulmonary sleep apnea , COPD, former smoker,    Pulmonary exam normal breath sounds clear to auscultation       Cardiovascular hypertension, Pt. on medications + CAD, + Past MI, + Cardiac Stents, + CABG, +CHF and + DOE  Normal cardiovascular exam Rhythm:Regular Rate:Normal  TTE 2021 1. Left ventricular ejection fraction, by estimation, is 50 to 55%. The  left ventricle has low normal function. The left ventricle has no regional  wall motion abnormalities. Left ventricular diastolic parameters are  consistent with Grade II diastolic  dysfunction (pseudonormalization).  2. Right ventricular systolic function is normal. The right ventricular  size is normal. There is moderately elevated pulmonary artery systolic  pressure.  3. Left atrial size was moderately dilated.  4. The mitral valve is normal in structure. Trivial mitral valve  regurgitation. No evidence of mitral stenosis.  5. The aortic valve is grossly normal. Aortic valve regurgitation is  trivial. No aortic stenosis is present.  LHC 2020 Conclusions: 1. Severe native coronary artery disease, including 75% ostial LMCA stenosis, 50% mid LAD disease, sequential 90% and 70% proximal/mid LCx stenoses as well as 70% mid OM1 lesion, and sequential 70% ostial, 80% proximal, and 95% distal RCA stenoses. 2. Widely patent LIMA-LAD. 3. Chronically occluded SVG-OM1. 4. Subtotally occluded SVG-rPDA-rPL with 99% mid graft stenosis followed by extensive thrombus, some of which has embolized into the distal branches. 5. Upper normal left ventricular filling  pressure. 6. Successful PCI to SVG-rPDA-rPL using overlapping Resolute Onyx 3.0 x 18 mm, 3.0 x 38 mm, and 3.0 x 38 mm drug-eluting stents with 0% residual stenosis and TIMI-3 flow.  Small amount of nonocclusive thrombus remained in the rPL branch at the end of the procedure.  Recommendations: 1.Indefinite dual antiplatelet therapy with aspirin and clopidogrel, as tolerated. 2.Medical therapy of protected LMCA and LCx disease.  If the patient has refractory symptoms, complex PCI of the ostial LMCA, LCx, and OM1 would need to be considered. 3.Aggressive secondary prevention. 4.Gentle post-catheterization hydration with close monitoring of renal function and hemoglobin, given contrast exposure and glycoprotein IIb/IIIa use during the intervention.     Neuro/Psych PSYCHIATRIC DISORDERS Depression negative neurological ROS     GI/Hepatic Neg liver ROS, hiatal hernia, GERD  Medicated and Controlled,  Endo/Other  diabetesHypothyroidism   Renal/GU Renal InsufficiencyRenal disease  negative genitourinary   Musculoskeletal  (+) narcotic dependent  Abdominal   Peds  Hematology  (+) Blood dyscrasia (on plavix), ,   Anesthesia Other Findings ERCP for choledocholithiasis. History of acsending cholangitis and pancreatitis due to biliary strictures.    Reproductive/Obstetrics                           Anesthesia Physical Anesthesia Plan  ASA: III  Anesthesia Plan: General   Post-op Pain Management:    Induction: Intravenous  PONV Risk Score and Plan: 2 and Dexamethasone, Ondansetron and Treatment may vary due to age or medical condition  Airway Management Planned: Oral ETT  Additional Equipment:   Intra-op Plan:   Post-operative Plan: Extubation in OR  Informed Consent: I have reviewed the patients History and Physical,  chart, labs and discussed the procedure including the risks, benefits and alternatives for the proposed anesthesia with the patient or  authorized representative who has indicated his/her understanding and acceptance.     Dental advisory given  Plan Discussed with: CRNA  Anesthesia Plan Comments:         Anesthesia Quick Evaluation

## 2020-06-03 ENCOUNTER — Ambulatory Visit (HOSPITAL_COMMUNITY): Payer: Medicare Other

## 2020-06-03 ENCOUNTER — Ambulatory Visit (HOSPITAL_COMMUNITY): Payer: Medicare Other | Admitting: Anesthesiology

## 2020-06-03 ENCOUNTER — Encounter (HOSPITAL_COMMUNITY): Payer: Self-pay | Admitting: Gastroenterology

## 2020-06-03 ENCOUNTER — Ambulatory Visit (HOSPITAL_COMMUNITY)
Admission: RE | Admit: 2020-06-03 | Discharge: 2020-06-03 | Disposition: A | Discharge status: Home / Self Care | Payer: Medicare Other | Attending: Gastroenterology | Admitting: Gastroenterology

## 2020-06-03 ENCOUNTER — Other Ambulatory Visit: Payer: Self-pay

## 2020-06-03 ENCOUNTER — Encounter (HOSPITAL_COMMUNITY)
Admission: RE | Disposition: A | Discharge status: Home / Self Care | Payer: Self-pay | Source: Home / Self Care | Attending: Gastroenterology

## 2020-06-03 DIAGNOSIS — Z87891 Personal history of nicotine dependence: Secondary | ICD-10-CM | POA: Diagnosis not present

## 2020-06-03 DIAGNOSIS — K8050 Calculus of bile duct without cholangitis or cholecystitis without obstruction: Secondary | ICD-10-CM

## 2020-06-03 DIAGNOSIS — S36490A Other injury of duodenum, initial encounter: Secondary | ICD-10-CM | POA: Insufficient documentation

## 2020-06-03 DIAGNOSIS — Z888 Allergy status to other drugs, medicaments and biological substances status: Secondary | ICD-10-CM | POA: Diagnosis not present

## 2020-06-03 DIAGNOSIS — Y848 Other medical procedures as the cause of abnormal reaction of the patient, or of later complication, without mention of misadventure at the time of the procedure: Secondary | ICD-10-CM | POA: Insufficient documentation

## 2020-06-03 DIAGNOSIS — K8020 Calculus of gallbladder without cholecystitis without obstruction: Secondary | ICD-10-CM | POA: Diagnosis not present

## 2020-06-03 DIAGNOSIS — Z951 Presence of aortocoronary bypass graft: Secondary | ICD-10-CM | POA: Insufficient documentation

## 2020-06-03 DIAGNOSIS — R141 Gas pain: Secondary | ICD-10-CM

## 2020-06-03 DIAGNOSIS — Y730 Diagnostic and monitoring gastroenterology and urology devices associated with adverse incidents: Secondary | ICD-10-CM | POA: Diagnosis not present

## 2020-06-03 DIAGNOSIS — Z955 Presence of coronary angioplasty implant and graft: Secondary | ICD-10-CM | POA: Insufficient documentation

## 2020-06-03 DIAGNOSIS — R140 Abdominal distension (gaseous): Secondary | ICD-10-CM

## 2020-06-03 DIAGNOSIS — R932 Abnormal findings on diagnostic imaging of liver and biliary tract: Secondary | ICD-10-CM | POA: Diagnosis present

## 2020-06-03 DIAGNOSIS — I214 Non-ST elevation (NSTEMI) myocardial infarction: Secondary | ICD-10-CM | POA: Diagnosis not present

## 2020-06-03 DIAGNOSIS — Z8551 Personal history of malignant neoplasm of bladder: Secondary | ICD-10-CM | POA: Insufficient documentation

## 2020-06-03 DIAGNOSIS — K838 Other specified diseases of biliary tract: Secondary | ICD-10-CM | POA: Insufficient documentation

## 2020-06-03 DIAGNOSIS — J984 Other disorders of lung: Secondary | ICD-10-CM | POA: Diagnosis not present

## 2020-06-03 DIAGNOSIS — Z9889 Other specified postprocedural states: Secondary | ICD-10-CM

## 2020-06-03 DIAGNOSIS — K631 Perforation of intestine (nontraumatic): Secondary | ICD-10-CM

## 2020-06-03 DIAGNOSIS — Z48815 Encounter for surgical aftercare following surgery on the digestive system: Secondary | ICD-10-CM | POA: Diagnosis not present

## 2020-06-03 HISTORY — PX: REMOVAL OF STONES: SHX5545

## 2020-06-03 HISTORY — PX: ESOPHAGOGASTRODUODENOSCOPY (EGD) WITH PROPOFOL: SHX5813

## 2020-06-03 HISTORY — PX: ENDOSCOPIC RETROGRADE CHOLANGIOPANCREATOGRAPHY (ERCP) WITH PROPOFOL: SHX5810

## 2020-06-03 HISTORY — PX: SPYGLASS CHOLANGIOSCOPY: SHX5441

## 2020-06-03 HISTORY — PX: HEMOSTASIS CLIP PLACEMENT: SHX6857

## 2020-06-03 SURGERY — ENDOSCOPIC RETROGRADE CHOLANGIOPANCREATOGRAPHY (ERCP) WITH PROPOFOL
Anesthesia: General

## 2020-06-03 MED ORDER — PROPOFOL 10 MG/ML IV BOLUS
INTRAVENOUS | Status: AC
  Filled 2020-06-03: qty 20

## 2020-06-03 MED ORDER — PROPOFOL 10 MG/ML IV BOLUS
INTRAVENOUS | Status: DC | PRN
  Administered 2020-06-03: 100 mg via INTRAVENOUS

## 2020-06-03 MED ORDER — SODIUM CHLORIDE 0.9 % IV SOLN: INTRAVENOUS | Status: DC

## 2020-06-03 MED ORDER — CIPROFLOXACIN IN D5W 400 MG/200ML IV SOLN
INTRAVENOUS | Status: AC
  Filled 2020-06-03: qty 200

## 2020-06-03 MED ORDER — FENTANYL CITRATE (PF) 100 MCG/2ML IJ SOLN
INTRAMUSCULAR | Status: AC
  Filled 2020-06-03: qty 2

## 2020-06-03 MED ORDER — ROCURONIUM BROMIDE 10 MG/ML (PF) SYRINGE
PREFILLED_SYRINGE | INTRAVENOUS | Status: DC | PRN
  Administered 2020-06-03: 70 mg via INTRAVENOUS

## 2020-06-03 MED ORDER — SODIUM CHLORIDE 0.9 % IV SOLN
INTRAVENOUS | Status: DC | PRN
  Administered 2020-06-03: 45 mL

## 2020-06-03 MED ORDER — DEXAMETHASONE SODIUM PHOSPHATE 10 MG/ML IJ SOLN
INTRAMUSCULAR | Status: DC | PRN
  Administered 2020-06-03: 10 mg via INTRAVENOUS

## 2020-06-03 MED ORDER — GLUCAGON HCL RDNA (DIAGNOSTIC) 1 MG IJ SOLR
INTRAMUSCULAR | Status: DC | PRN
  Administered 2020-06-03 (×2): .25 mg via INTRAVENOUS

## 2020-06-03 MED ORDER — FENTANYL CITRATE (PF) 100 MCG/2ML IJ SOLN
INTRAMUSCULAR | Status: DC | PRN
  Administered 2020-06-03 (×2): 50 ug via INTRAVENOUS

## 2020-06-03 MED ORDER — INDOMETHACIN 50 MG RE SUPP
RECTAL | Status: AC
  Filled 2020-06-03: qty 2

## 2020-06-03 MED ORDER — CIPROFLOXACIN IN D5W 400 MG/200ML IV SOLN
INTRAVENOUS | Status: DC | PRN
  Administered 2020-06-03: 400 mg via INTRAVENOUS

## 2020-06-03 MED ORDER — GLUCAGON HCL RDNA (DIAGNOSTIC) 1 MG IJ SOLR
INTRAMUSCULAR | Status: AC
  Filled 2020-06-03: qty 1

## 2020-06-03 MED ORDER — INDOMETHACIN 50 MG RE SUPP
RECTAL | Status: DC | PRN
  Administered 2020-06-03: 100 mg via RECTAL

## 2020-06-03 MED ORDER — PHENYLEPHRINE 40 MCG/ML (10ML) SYRINGE FOR IV PUSH (FOR BLOOD PRESSURE SUPPORT)
PREFILLED_SYRINGE | INTRAVENOUS | Status: DC | PRN
  Administered 2020-06-03 (×2): 160 ug via INTRAVENOUS

## 2020-06-03 MED ORDER — ONDANSETRON HCL 4 MG/2ML IJ SOLN
INTRAMUSCULAR | Status: DC | PRN
  Administered 2020-06-03: 4 mg via INTRAVENOUS

## 2020-06-03 MED ORDER — LACTATED RINGERS IV SOLN: INTRAVENOUS | Status: DC

## 2020-06-03 MED ORDER — LIDOCAINE 2% (20 MG/ML) 5 ML SYRINGE
INTRAMUSCULAR | Status: DC | PRN
  Administered 2020-06-03: 60 mg via INTRAVENOUS

## 2020-06-03 MED ORDER — SUGAMMADEX SODIUM 200 MG/2ML IV SOLN
INTRAVENOUS | Status: DC | PRN
  Administered 2020-06-03: 200 mg via INTRAVENOUS

## 2020-06-03 MED ORDER — CLOPIDOGREL BISULFATE 75 MG PO TABS
1.0000 | ORAL_TABLET | Freq: Every day | ORAL | 3 refills | Status: DC
Start: 2020-06-06 — End: 2021-04-14

## 2020-06-03 NOTE — Anesthesia Procedure Notes (Signed)
Procedure Name: Intubation Date/Time: 06/03/2020 11:01 AM Performed by: Maxwell Caul, CRNA Pre-anesthesia Checklist: Patient identified, Emergency Drugs available, Suction available and Patient being monitored Patient Re-evaluated:Patient Re-evaluated prior to induction Oxygen Delivery Method: Circle system utilized Preoxygenation: Pre-oxygenation with 100% oxygen Induction Type: IV induction Ventilation: Mask ventilation without difficulty Laryngoscope Size: Mac and 4 Grade View: Grade I Tube type: Oral Tube size: 7.5 mm Number of attempts: 1 Airway Equipment and Method: Stylet Placement Confirmation: ETT inserted through vocal cords under direct vision,  positive ETCO2 and breath sounds checked- equal and bilateral Secured at: 21 cm Tube secured with: Tape Dental Injury: Teeth and Oropharynx as per pre-operative assessment

## 2020-06-03 NOTE — Transfer of Care (Signed)
Immediate Anesthesia Transfer of Care Note  Patient: [name]  Procedure(s) Performed: ENDOSCOPIC RETROGRADE CHOLANGIOPANCREATOGRAPHY (ERCP) WITH PROPOFOL (N/A )  Patient Location: PACU  Anesthesia Type:General  Level of Consciousness: awake, alert  and oriented  Airway & Oxygen Therapy: Patient Spontanous Breathing and Patient connected to face mask oxygen  Post-op Assessment: Report given to RN and Post -op Vital signs reviewed and stable  Post vital signs: Reviewed and stable  Last Vitals:  Vitals Value Taken Time  BP    Temp    Pulse 51 06/03/20 1233  Resp 16 06/03/20 1233  SpO2 100 % 06/03/20 1233  Vitals shown include unvalidated device data.  Last Pain:  Vitals:   06/03/20 0929  TempSrc: Oral  PainSc: 0-No pain         Complications: No complications documented.

## 2020-06-03 NOTE — Op Note (Addendum)
[facility] Patient Name: [name] Procedure Date: 06/03/2020 MRN: [id number] Attending MD: Justice Britain , MD Date of Birth: Aug 14, 1940 CSN: [id number] Age: 80 Admit Type: Outpatient Procedure:                ERCP Indications:              Bile duct stone(s), Bile duct stone on Computed                            Tomogram Scan Providers:                Justice Britain, MD, Kary Kos RN, RN,                            Laverda Sorenson, Technician, [facility], CRNA Referring MD:             Rayford Halsted. Isaac Bliss, Chelsea A. Connor Medicines:                Cipro 400 mg IV, Indomethacin 100 mg PR, Glucagon                            0.5 mg IV, General Anesthesia Complications:            Tear Estimated Blood Loss:     Estimated blood loss was minimal. Procedure:                Pre-Anesthesia Assessment:                           - Prior to the procedure, a History and Physical                            was performed, and patient medications and                            allergies were reviewed. The patient's tolerance of                            previous anesthesia was also reviewed. The risks                            and benefits of the procedure and the sedation                            options and risks were discussed with the patient.                            All questions were answered, and informed consent                            was obtained. Prior Anticoagulants: The patient has                            taken Plavix (clopidogrel), last dose was 5 days  prior to procedure. ASA Grade Assessment: III - A                            patient with severe systemic disease. After                            reviewing the risks and benefits, the patient was                            deemed in satisfactory condition to undergo the                            procedure.                           After  obtaining informed consent, the scope was                            passed under direct vision. Throughout the                            procedure, the patient's blood pressure, pulse, and                            oxygen saturations were monitored continuously. The                            [employer] D single use                            duodenoscope was introduced through the mouth, and                            used to inject contrast into and used to inject                            contrast into the bile duct. The GIF-H190 ([id number])                            Olympus gastroscope was introduced through the                            mouth, and used to inject contrast into and used to                            locate the major papilla. The ERCP was somewhat                            difficult due to persistent scope positioning                            changes/movement that did not allow stability.  Successful completion of the procedure was aided by                            performing the maneuvers documented (below) in this                            report. The patient tolerated the procedure. Scope In: Scope Out: Findings:      A scout film of the abdomen was obtained. Surgical clips, consistent       with a previous cholecystectomy, were seen in the area of the right       upper quadrant of the abdomen.      The esophagus was successfully intubated under direct vision without       detailed examination of the pharynx, larynx, and associated structures,       and upper GI tract as the upper GI tract had been evaluated previously.       A biliary sphincterotomy had been performed. The sphincterotomy appeared       open.      A short 0.035 inch Soft Jagwire was passed into the biliary tree. The       Hydratome sphincterotome was passed over the guidewire and the bile duct       was then deeply cannulated. Contrast was  injected. I personally       interpreted the bile duct images. Ductal flow of contrast was adequate.       Image quality was adequate. Contrast extended to the hepatic ducts.       Opacification of the entire biliary tree except for the cystic duct and       gallbladder was successful. The lower third of the main bile duct       contained a single filling defect thought to be air bubble vs the       possible prior noted CT scan suggesting a retained CBD stone. The main       bile duct was moderately dilated. The largest diameter was 15 mm. It was       not clear what was different this time with ERCP, but positioning in the       short position was not as stable as it was on my previous ERCP attempt.       I lost position on different occasions but eventually in a       semi-long/semi-short position I was able to maintain my stability. To       discover objects, the biliary tree was swept with a retrieval balloon       starting at the bifurcation. Very small amount of sludge was swept from       the duct. I placed the wire in both sides of the proximal CBD and did       pull throughs, with both the 12 mm and 15 mm retrieval balloons. The       balloons passed with ease through the entire CBD. An occlusion       cholangiogram was performed that showed no further significant biliary       pathology but with concern about the prior CT suggesting a stone in the       CBD, decision made to explore the bile duct endoscopically using the       SpyGlass direct visualization system. The SpyScope was advanced to the  bifurcation. Visibility with the scope was good. The lumen of the very       distal main bile duct was mildly narrowed. Normal mucosa was found in       the main bile duct otherwise. No evidence of any stone material was       noted throughout the biliary tree.      Unfoturnately, I lost positioning of the endoscope and the entire       SpyScope and endoscope returned to the stomach  and I removed the       Spyscope within the stomach.      A pancreatogram was not performed.      When I re-evaluated the sphincterotomy site, there was no issue noted       there, however, a non-bleeding mucosal tear as a result of suspected       scope trauma (as noted above from scope positioning becoming undone with       the SpyScope still being in place) was found in the D1/D2 angle/sweep.       This measured 10 mm in length. Through the ERCP and the EGD scope five       hemostatic clips were successfully placed (MR conditional) to close this       area. There was no bleeding at the end of the procedure.      The endoscope was withdrawn from the patient and the procedure completed. Impression:               - Prior biliary sphincterotomy appeared open.                           - The entire main bile duct was moderately dilated.                           - A filling defect was noted that was felt to be                            more likely air rather than stone on fluoroscopy.                           - The biliary tree was swept and very small amount                            of sludge was found. Stones were not removed.                           - The filling defect looked to have been removed                            completely, but the size of the previous stone on                            CT scan made me feel need to ensure it was gone.                            SpyGlass interrogation was performed. There was  mild narrowing in the distal CBD, but the majority                            of the rest of the biliary tract had normal mucosa.                           - Incidental mucosal tear noted after                            Duodenoscope/SpyScope positioning loss in the D1/D2                            angle. Five hemostatic clips were successfully                            placed (MR conditional) to close this defect. Moderate Sedation:       Not Applicable - Patient had care per Anesthesia. Recommendation:           - The patient will be observed post-procedure,                            until all discharge criteria are met.                           - Discharge patient to home as long as he is stable.                           - Patient has a contact number available for                            emergencies. The signs and symptoms of potential                            delayed complications were discussed with the                            patient. Return to normal activities tomorrow.                            Written discharge instructions were provided to the                            patient.                           - Full liquid diet today and if doing well then                            return to regular diet tomorrow.                           - Watch for pancreatitis, bleeding, perforation,  and cholangitis.                           - Observe patient's clinical course.                           - KUB & CXR Portable to be obtain in recovery to                            ensure no evidence of free-air is noted to suggest                            a deeper defect or issue.                           - Watch for pancreatitis, bleeding, perforation,                            and cholangitis.                           - Increase Omeprazole to 40 mg twice daily for next                            2-weeks and then back to 40 mg twice daily                            thereafter.                           - Restart Plavix in 72 hours (5/19) to decrease                            risks of post-interventional bleeding.                           - The findings and recommendations were discussed                            with the patient.                           - The findings and recommendations were discussed                            with the patient's family. Procedure Code(s):         --- Professional ---                           [id number], Endoscopic retrograde                            cholangiopancreatography (ERCP); with removal of                            calculi/debris from biliary/pancreatic duct(s)  43273, Endoscopic cannulation of papilla with                            direct visualization of pancreatic/common bile                            duct(s) (List separately in addition to code(s) for                            primary procedure)                           [id number], Unlisted procedure, biliary tract Diagnosis Code(s):        --- Professional ---                           [phone], Other injury of duodenum, initial                            encounter                           R93.2, Abnormal findings on diagnostic imaging of                            liver and biliary tract                           K80.50, Calculus of bile duct without cholangitis                            or cholecystitis without obstruction                           K83.8, Other specified diseases of biliary tract CPT copyright 2019 [employer]. All rights reserved. The codes documented in this report are preliminary and upon coder review may  be revised to meet current compliance requirements. Justice Britain, MD 06/03/2020 12:52:18 PM Number of Addenda: 0

## 2020-06-03 NOTE — Discharge Instructions (Signed)
YOU HAD AN ENDOSCOPIC PROCEDURE TODAY: Refer to the procedure report and other information in the discharge instructions given to you for any specific questions about what was found during the examination. If this information does not answer your questions, please call [place] office at [phone] to clarify.   YOU SHOULD EXPECT: Some feelings of bloating in the abdomen. Passage of more gas than usual. Walking can help get rid of the air that was put into your GI tract during the procedure and reduce the bloating. If you had a lower endoscopy (such as a colonoscopy or flexible sigmoidoscopy) you may notice spotting of blood in your stool or on the toilet paper. Some abdominal soreness may be present for a day or two, also.  DIET: Your first meal following the procedure should be a light meal and then it is ok to progress to your normal diet. A half-sandwich or bowl of soup is an example of a good first meal. Heavy or fried foods are harder to digest and may make you feel nauseous or bloated. Drink plenty of fluids but you should avoid alcoholic beverages for 24 hours. If you had a esophageal dilation, please see attached instructions for diet.    ACTIVITY: Your care partner should take you home directly after the procedure. You should plan to take it easy, moving slowly for the rest of the day. You can resume normal activity the day after the procedure however YOU SHOULD NOT DRIVE, use power tools, machinery or perform tasks that involve climbing or major physical exertion for 24 hours (because of the sedation medicines used during the test).   SYMPTOMS TO REPORT IMMEDIATELY: A gastroenterologist can be reached at any hour. Please call [phone]  for any of the following symptoms:   Following upper endoscopy (EGD, EUS, ERCP, esophageal dilation) Vomiting of blood or coffee ground material  New, significant abdominal pain  New, significant chest pain or pain under the shoulder blades  Painful or  persistently difficult swallowing  New shortness of breath  Black, tarry-looking or red, bloody stools  FOLLOW UP:  If any biopsies were taken you will be contacted by phone or by letter within the next 1-3 weeks. Call [phone]  if you have not heard about the biopsies in 3 weeks.  Please also call with any specific questions about appointments or follow up tests.  

## 2020-06-03 NOTE — H&P (Signed)
GASTROENTEROLOGY PROCEDURE H&P NOTE   Primary Care Physician: Isaac Bliss, Rayford Halsted, MD  HPI: [name] is a 80 y.o. male who presents for ERCP for choledocholithiasis noted on last cross-sectional imaging.  Past Medical History:  Diagnosis Date  . Allergy   . B12 deficiency anemia   . Blood transfusion without reported diagnosis   . CAD (coronary artery disease)   . Cancer ([place])    bladder-   . Colon polyps   . COPD (chronic obstructive pulmonary disease) ([place])   . Depression   . Esophagus, Barrett's   . GERD (gastroesophageal reflux disease)   . History of bladder cancer    Bladder cancer "8 times"  . History of hiatal hernia   . Hyperlipidemia   . Hypertension   . Localized osteoarthrosis, lower leg   . Myocardial infarction ([place]) 2021  . Restless leg syndrome   . Sleep apnea    does not wear cpap  . Stenosis of esophagus    Past Surgical History:  Procedure Laterality Date  . BILIARY BRUSHING  04/01/2018   Procedure: BILIARY BRUSHING;  Surgeon: Rush Landmark Telford Nab., MD;  Location: [place];  Service: Gastroenterology;;  . BILIARY BRUSHING  09/12/2018   Procedure: BILIARY BRUSHING;  Surgeon: Irving Copas., MD;  Location: [place];  Service: Gastroenterology;;  . BILIARY BRUSHING  11/28/2018   Procedure: BILIARY BRUSHING;  Surgeon: Irving Copas., MD;  Location: [place];  Service: Gastroenterology;;  . BILIARY BRUSHING  03/11/2020   Procedure: BILIARY BRUSHING;  Surgeon: Irving Copas., MD;  Location: [facility];  Service: Gastroenterology;;  . BILIARY DILATION  09/12/2018   Procedure: BILIARY DILATION;  Surgeon: Irving Copas., MD;  Location: [place];  Service: Gastroenterology;;  . BILIARY DILATION  11/28/2018   Procedure: BILIARY DILATION;  Surgeon: Irving Copas., MD;  Location: [place];  Service: Gastroenterology;;  . BILIARY DILATION  03/11/2020   Procedure: BILIARY DILATION;   Surgeon: Irving Copas., MD;  Location: [facility];  Service: Gastroenterology;;  . BILIARY STENT PLACEMENT  04/01/2018   Procedure: BILIARY STENT PLACEMENT;  Surgeon: Irving Copas., MD;  Location: [place];  Service: Gastroenterology;;  . BILIARY STENT PLACEMENT  09/12/2018   Procedure: BILIARY STENT PLACEMENT;  Surgeon: Irving Copas., MD;  Location: [place];  Service: Gastroenterology;;  . BILIARY STENT PLACEMENT  11/28/2018   Procedure: BILIARY STENT PLACEMENT;  Surgeon: Irving Copas., MD;  Location: [place];  Service: Gastroenterology;;  . BIOPSY  04/01/2018   Procedure: BIOPSY;  Surgeon: Irving Copas., MD;  Location: [place];  Service: Gastroenterology;;  . BIOPSY  09/12/2018   Procedure: BIOPSY;  Surgeon: Irving Copas., MD;  Location: [place];  Service: Gastroenterology;;  . BIOPSY  11/28/2018   Procedure: BIOPSY;  Surgeon: Irving Copas., MD;  Location: [place];  Service: Gastroenterology;;  . BIOPSY  03/11/2020   Procedure: BIOPSY;  Surgeon: Irving Copas., MD;  Location: [facility];  Service: Gastroenterology;;  . bladder cancer      x 8 cystoscopy  . CERVICAL DISCECTOMY     ACDF  . COLONOSCOPY  11/17/2005   normal   . CORONARY ARTERY BYPASS GRAFT     x4  . CORONARY STENT INTERVENTION N/A 01/05/2019   Procedure: CORONARY STENT INTERVENTION;  Surgeon: Nelva Bush, MD;  Location: [place] [facility];  Service: Cardiovascular;  Laterality: N/A;  . ENDOSCOPIC MUCOSAL RESECTION  09/12/2018   Procedure: ENDOSCOPIC MUCOSAL RESECTION;  Surgeon: Irving Copas., MD;  Location: [facility];  Service: Gastroenterology;;  . ENDOSCOPIC RETROGRADE CHOLANGIOPANCREATOGRAPHY (ERCP) WITH PROPOFOL N/A 04/01/2018   Procedure: ENDOSCOPIC RETROGRADE CHOLANGIOPANCREATOGRAPHY (ERCP) WITH PROPOFOL;  Surgeon: Irving Copas., MD;  Location: [place];  Service: Gastroenterology;   Laterality: N/A;  . ENDOSCOPIC RETROGRADE CHOLANGIOPANCREATOGRAPHY (ERCP) WITH PROPOFOL N/A 09/12/2018   Procedure: ENDOSCOPIC RETROGRADE CHOLANGIOPANCREATOGRAPHY (ERCP) WITH PROPOFOL;  Surgeon: Rush Landmark Telford Nab., MD;  Location: [place];  Service: Gastroenterology;  Laterality: N/A;  . ENDOSCOPIC RETROGRADE CHOLANGIOPANCREATOGRAPHY (ERCP) WITH PROPOFOL N/A 03/11/2020   Procedure: ENDOSCOPIC RETROGRADE CHOLANGIOPANCREATOGRAPHY (ERCP) WITH PROPOFOL;  Surgeon: Rush Landmark Telford Nab., MD;  [facility]: [facility];  Service: Gastroenterology;  Laterality: N/A;  . ERCP N/A 11/28/2018   Procedure: ENDOSCOPIC RETROGRADE CHOLANGIOPANCREATOGRAPHY (ERCP) +EGD with spyglass;  Surgeon: Rush Landmark Telford Nab., MD;  Location: [place];  Service: Gastroenterology;  Laterality: N/A;  . ESOPHAGOGASTRODUODENOSCOPY  04/29/2010  . ESOPHAGOGASTRODUODENOSCOPY (EGD) WITH PROPOFOL N/A 04/01/2018   Procedure: ESOPHAGOGASTRODUODENOSCOPY (EGD) WITH PROPOFOL;  Surgeon: Rush Landmark Telford Nab., MD;  Location: [place];  Service: Gastroenterology;  Laterality: N/A;  . ESOPHAGOGASTRODUODENOSCOPY (EGD) WITH PROPOFOL N/A 09/12/2018   Procedure: ESOPHAGOGASTRODUODENOSCOPY (EGD) WITH PROPOFOL;  Surgeon: Rush Landmark Telford Nab., MD;  Location: [place];  Service: Gastroenterology;  Laterality: N/A;  . ESOPHAGOGASTRODUODENOSCOPY (EGD) WITH PROPOFOL N/A 11/28/2018   Procedure: ESOPHAGOGASTRODUODENOSCOPY (EGD) WITH PROPOFOL;  Surgeon: Rush Landmark Telford Nab., MD;  Location: [place];  Service: Gastroenterology;  Laterality: N/A;  . ESOPHAGOGASTRODUODENOSCOPY (EGD) WITH PROPOFOL N/A 03/11/2020   Procedure: ESOPHAGOGASTRODUODENOSCOPY (EGD) WITH PROPOFOL;  Surgeon: Rush Landmark Telford Nab., MD;  [facility];  Service: Gastroenterology;  Laterality: N/A;  . EUS  04/01/2018   Procedure: FULL UPPER ENDOSCOPIC ULTRASOUND (EUS) RADIAL;  Surgeon: Irving Copas., MD;  Location: [place];  Service:  Gastroenterology;;  . EUS N/A 09/12/2018   Procedure: UPPER ENDOSCOPIC ULTRASOUND (EUS) RADIAL;  Surgeon: Irving Copas., MD;  Location: [place];  Service: Gastroenterology;  Laterality: N/A;  . FINE NEEDLE ASPIRATION  09/12/2018   Procedure: FINE NEEDLE ASPIRATION (FNA) LINEAR;  Surgeon: Irving Copas., MD;  Location: [place];  Service: Gastroenterology;;  . HAND SURGERY Left 2018   saw accident  . HEMOSTASIS CLIP PLACEMENT  09/12/2018   Procedure: HEMOSTASIS CLIP PLACEMENT;  Surgeon: Irving Copas., MD;  Location: [place];  Service: Gastroenterology;;  . I & D EXTREMITY Left 11/24/2016   Procedure: IRRIGATION AND DEBRIDEMENT LEFT HAND, THUMB, INDEX, MIDDLE, RING, AND SMALL FINGERS WITH RECONSTRUCTION;  Surgeon: Roseanne Kaufman, MD;  Location: [place];  Service: Orthopedics;  Laterality: Left;  . KNEE ARTHROSCOPY Left   . LEFT HEART CATH AND CORS/GRAFTS ANGIOGRAPHY N/A 01/05/2019   Procedure: LEFT HEART CATH AND CORS/GRAFTS ANGIOGRAPHY;  Surgeon: Nelva Bush, MD;  Location: [place] [facility];  Service: Cardiovascular;  Laterality: N/A;  . LUMBAR LAMINECTOMY     and fusion x 2  . NASAL SINUS SURGERY    . POPLITEAL SYNOVIAL CYST EXCISION    . REMOVAL OF STONES  04/01/2018   Procedure: REMOVAL OF STONES;  Surgeon: Rush Landmark Telford Nab., MD;  Location: [place];  Service: Gastroenterology;;  . REMOVAL OF STONES  09/12/2018   Procedure: REMOVAL OF STONES;  Surgeon: Irving Copas., MD;  Location: [place];  Service: Gastroenterology;;  . REMOVAL OF STONES  11/28/2018   Procedure: REMOVAL OF STONES;  Surgeon: Irving Copas., MD;  Location: [place];  Service: Gastroenterology;;  . REMOVAL OF STONES  03/11/2020   Procedure: REMOVAL OF STONES;  Surgeon: Irving Copas., MD;  Location: [facility];  Service: Gastroenterology;;  . Azzie Almas DILATION N/A 09/12/2018   Procedure: Renato Battles;  Surgeon: Irving Copas., MD;  Location: [place];  Service: Gastroenterology;  Laterality: N/A;  . SAVORY DILATION N/A 11/28/2018   Procedure: SAVORY DILATION;  Surgeon: Rush Landmark Telford Nab., MD;  Location: [place];  Service: Gastroenterology;  Laterality: N/A;  . SPHINCTEROTOMY  04/01/2018   Procedure: SPHINCTEROTOMY;  Surgeon: Rush Landmark Telford Nab., MD;  Location: [place];  Service: Gastroenterology;;  . Bess Kinds CHOLANGIOSCOPY N/A 11/28/2018   Procedure: [id number] CHOLANGIOSCOPY;  Surgeon: Irving Copas., MD;  Location: [place];  Service: Gastroenterology;  Laterality: N/A;  . STENT REMOVAL  09/12/2018   Procedure: STENT REMOVAL;  Surgeon: Irving Copas., MD;  Location: [place];  Service: Gastroenterology;;  . Lavell Islam REMOVAL  11/28/2018   Procedure: STENT REMOVAL;  Surgeon: Irving Copas., MD;  Location: [place];  Service: Gastroenterology;;  . Lavell Islam REMOVAL  03/11/2020   Procedure: STENT REMOVAL;  Surgeon: Irving Copas., MD;  Location: [facility];  Service: Gastroenterology;;  . Lia Foyer LIFTING INJECTION  09/12/2018   Procedure: SUBMUCOSAL LIFTING INJECTION;  Surgeon: Irving Copas., MD;  Location: [place];  Service: Gastroenterology;;   Current Facility-Administered Medications  Medication Dose Route Frequency Provider Last Rate Last Admin  . 0.9 %  sodium chloride infusion   Intravenous Continuous Mansouraty, Telford Nab., MD      . ampicillin-sulbactam (UNASYN) 1.5 g in sodium chloride 0.9 % 100 mL IVPB  1.5 g Intravenous On Call to [place], Telford Nab., MD      . lactated ringers infusion   Intravenous Continuous Mansouraty, Telford Nab., MD 10 mL/hr at 06/03/20 0937 New Bag at 06/03/20 [id number]   Allergies  Allergen Reactions  . Losartan Potassium Other (See Comments)    Hyperkalemia  . Tape Itching and Rash    reddened skin    Family History  Problem Relation Age of Onset  . Melanoma Mother   . Stroke Father   .  Hypertension Father   . Coronary artery disease Other   . Colon cancer Neg Hx   . Esophageal cancer Neg Hx   . Stomach cancer Neg Hx   . Rectal cancer Neg Hx   . Pancreatic cancer Neg Hx   . Liver disease Neg Hx   . Inflammatory bowel disease Neg Hx    Social History   Socioeconomic History  . Marital status: Married    Spouse name: Not on file  . Number of children: Not on file  . Years of education: Not on file  . Highest education level: Not on file  Occupational History  . Not on file  Tobacco Use  . Smoking status: Former Smoker    Packs/day: 0.50    Years: 5.00    Pack years: 2.50    Types: Cigarettes    Quit date: 03/30/1976    Years since quitting: 44.2  . Smokeless tobacco: Never Used  Vaping Use  . Vaping Use: Never used  Substance and Sexual Activity  . Alcohol use: No    Alcohol/week: 0.0 standard drinks  . Drug use: No  . Sexual activity: Yes  Other Topics Concern  . Not on file  Social History Narrative  . Not on file   Social Determinants of Health   Financial Resource Strain: Low Risk   . Difficulty of Paying Living Expenses: Not hard at all  Food Insecurity: Not on  file  Transportation Needs: No Transportation Needs  . Lack of Transportation (Medical): No  . Lack of Transportation (Non-Medical): No  Physical Activity: Not on file  Stress: Not on file  Social Connections: Not on file  Intimate Partner Violence: Not on file    Physical Exam: Vital signs in last 24 hours: Temp:  [97.9 F (36.6 C)] 97.9 F (36.6 C) (05/16 0929) Pulse Rate:  [58] 58 (05/16 0929) Resp:  [22] 22 (05/16 0929) BP: (136)/(60) 136/60 (05/16 0929) SpO2:  [100 %] 100 % (05/16 0929) Weight:  [76.2 kg] 76.2 kg (05/16 0929)   GEN: NAD EYE: Sclerae anicteric ENT: MMM CV: Non-tachycardic GI: Soft, NT/ND NEURO:  Alert & Oriented x 3  Lab Results: No results for input(s): WBC, HGB, HCT, PLT in the last 72 hours. BMET No results for input(s): NA, K, CL, CO2,  GLUCOSE, BUN, CREATININE, CALCIUM in the last 72 hours. LFT No results for input(s): PROT, ALBUMIN, AST, ALT, ALKPHOS, BILITOT, BILIDIR, IBILI in the last 72 hours. PT/INR No results for input(s): LABPROT, INR in the last 72 hours.   Impression / Plan: This is a 80 y.o.male who presents for ERCP for choledocholithiasis noted on last cross-sectional imaging.  The risks of an ERCP were discussed at length, including but not limited to the risk of perforation, bleeding, abdominal pain, post-ERCP pancreatitis (while usually mild can be severe and even life threatening).  The risks and benefits of endoscopic evaluation were discussed with the patient; these include but are not limited to the risk of perforation, infection, bleeding, missed lesions, lack of diagnosis, severe illness requiring hospitalization, as well as anesthesia and sedation related illnesses.  The patient is agreeable to proceed.    Justice Britain, MD [facility] Office # [phone]

## 2020-06-04 ENCOUNTER — Encounter (HOSPITAL_COMMUNITY): Payer: Self-pay | Admitting: Gastroenterology

## 2020-06-04 NOTE — Anesthesia Postprocedure Evaluation (Signed)
Anesthesia Post Note  Patient: [name]  Procedure(s) Performed: ENDOSCOPIC RETROGRADE CHOLANGIOPANCREATOGRAPHY (ERCP) WITH PROPOFOL (N/A ) HEMOSTASIS CLIP PLACEMENT SPYGLASS CHOLANGIOSCOPY (N/A )     Patient location during evaluation: PACU Anesthesia Type: General Level of consciousness: awake and alert Pain management: pain level controlled Vital Signs Assessment: post-procedure vital signs reviewed and stable Respiratory status: spontaneous breathing, nonlabored ventilation, respiratory function stable and patient connected to nasal cannula oxygen Cardiovascular status: blood pressure returned to baseline and stable Postop Assessment: no apparent nausea or vomiting Anesthetic complications: no   No complications documented.  Last Vitals:  Vitals:   06/03/20 1240 06/03/20 1250  BP: (!) 150/61 123/74  Pulse: (!) 52 (!) 53  Resp: 16 16  Temp:    SpO2: 94% 91%    Last Pain:  Vitals:   06/03/20 1250  TempSrc:   PainSc: 8    Pain Goal: Patients Stated Pain Goal: 3 (06/03/20 1250)                 Tynisha Ogan L Rickita Forstner

## 2020-06-07 ENCOUNTER — Telehealth: Payer: Self-pay | Admitting: Internal Medicine

## 2020-06-07 ENCOUNTER — Telehealth: Payer: Self-pay | Admitting: Cardiovascular Disease

## 2020-06-07 NOTE — Telephone Encounter (Signed)
Pt is calling in wanting to know what steps he needs to take to get 8 teeth taking care of since they are broke off in the gums and he is taking blood thinner.  Pt would like to have a call back.

## 2020-06-07 NOTE — Telephone Encounter (Signed)
Pt have been off his blood thinner since his procedure on 05/28/20, he wants to know when he should restart the blood thinner.

## 2020-06-07 NOTE — Telephone Encounter (Signed)
Patient is aware 

## 2020-06-07 NOTE — Telephone Encounter (Signed)
Called patient, advised the restart of medication comes from the surgeons instruction, and he should contact them in regards to restarting this. Patient verbalized understanding.   He also has to have some teeth removed, and he wanted to know what to do in regards to his blood thinner for this. Recommended to restart and once he has an appointment or knows the information for the teeth removal we would go from that. Patient verbalized understanding.   Thankful for call back

## 2020-06-18 DIAGNOSIS — Z85828 Personal history of other malignant neoplasm of skin: Secondary | ICD-10-CM | POA: Diagnosis not present

## 2020-06-18 DIAGNOSIS — L821 Other seborrheic keratosis: Secondary | ICD-10-CM | POA: Diagnosis not present

## 2020-07-08 ENCOUNTER — Other Ambulatory Visit: Payer: Self-pay | Admitting: Internal Medicine

## 2020-07-08 DIAGNOSIS — E038 Other specified hypothyroidism: Secondary | ICD-10-CM

## 2020-07-12 ENCOUNTER — Telehealth: Payer: Self-pay | Admitting: Gastroenterology

## 2020-07-12 NOTE — Telephone Encounter (Signed)
Left message for pt. Will need to contact billing office for further questions.

## 2020-07-12 NOTE — Telephone Encounter (Signed)
Inbound call from patient stating he received a bill for medication he needed for his CT scan.  States the paperwork he received via mail stated Dr. Rush Landmark  would also be getting paperwork to fill out to have decision appealed.  Please advise.

## 2020-07-15 ENCOUNTER — Other Ambulatory Visit: Payer: Self-pay | Admitting: Internal Medicine

## 2020-07-15 NOTE — Telephone Encounter (Signed)
I will forward this on to Dr.Mansouraty. This has been an issue in the past with another patient. Not sure how it got resolved, but I will see if Dr. Rush Landmark knows anything else. Dr. Rush Landmark is a away on vacation this week, but will see this when he gets back.

## 2020-07-15 NOTE — Telephone Encounter (Signed)
Pt returned call. He stated that he already spoke with billing and was told that nothing can be done. Pt stated that her insurance told him that if it had been sent to his local pharmacy, contrast would have been covered but because it was sent to a hospital pharmacy it was not. The letter that he received states that Dr. Rush Landmark could send a letter requesting coverage on his behalf. Pt states that billing told him that he can make monthly payments of $80. However, pt states that he is on social security and cannot afford to make payments. I am not sure of how we can help this pt but will consult with [facility].

## 2020-07-20 ENCOUNTER — Other Ambulatory Visit: Payer: Self-pay | Admitting: Cardiovascular Disease

## 2020-07-23 NOTE — Telephone Encounter (Signed)
[name], I am not sure exactly what he is talking about. Can you get me some more information as to whether he is getting this bill for his CT scan or for his ERCP? If he has information that he wants to drop off so that we can see if there is something that we can do that would be helpful as well. Thanks. GM

## 2020-07-25 NOTE — Telephone Encounter (Signed)
Pt called again. He will stop by tomorrow so we can make a copy of the bills that he received. He will ask for [place]. Thank you

## 2020-07-27 ENCOUNTER — Other Ambulatory Visit: Payer: Self-pay | Admitting: Cardiovascular Disease

## 2020-07-27 DIAGNOSIS — R0600 Dyspnea, unspecified: Secondary | ICD-10-CM

## 2020-07-27 DIAGNOSIS — R0609 Other forms of dyspnea: Secondary | ICD-10-CM

## 2020-07-29 NOTE — Telephone Encounter (Signed)
Patient stopped office and gave me copies of EOB that he received. Looks like patient is being charged to oral contrast that was billed incorrectly because it was not properly sent into the insurance. I advised pt that I would see what I could do, although I do not work in [employer]. I will check with Dr.Mansouraty as well to see if he can help with this.

## 2020-07-31 ENCOUNTER — Other Ambulatory Visit: Payer: Self-pay | Admitting: Internal Medicine

## 2020-07-31 DIAGNOSIS — N35012 Post-traumatic membranous urethral stricture: Secondary | ICD-10-CM | POA: Diagnosis not present

## 2020-08-01 ENCOUNTER — Other Ambulatory Visit: Payer: Self-pay | Admitting: Internal Medicine

## 2020-08-01 DIAGNOSIS — E538 Deficiency of other specified B group vitamins: Secondary | ICD-10-CM

## 2020-08-01 DIAGNOSIS — G8929 Other chronic pain: Secondary | ICD-10-CM

## 2020-08-01 MED ORDER — OXYCODONE-ACETAMINOPHEN 10-325 MG PO TABS: 1.0000 | ORAL_TABLET | Freq: Two times a day (BID) | ORAL | 0 refills | Status: AC | PRN

## 2020-08-01 MED ORDER — NITROGLYCERIN 0.4 MG SL SUBL: SUBLINGUAL_TABLET | SUBLINGUAL | 2 refills | Status: DC

## 2020-08-01 MED ORDER — OXYCODONE-ACETAMINOPHEN 10-325 MG PO TABS: 1.0000 | ORAL_TABLET | Freq: Two times a day (BID) | ORAL | 0 refills | Status: DC | PRN

## 2020-08-01 NOTE — Telephone Encounter (Signed)
Patient had an office visit 05/29/20

## 2020-08-01 NOTE — Telephone Encounter (Signed)
Pt call and stated he need a refill on  oxyCODONE-acetaminophen (PERCOCET) 10-325 MG tablet and nitroGLYCERIN (NITROSTAT) 0.4 MG SL tablet and want it sent to  [place], [place] - [address] Phone:  [phone]  Fax:  [phone]

## 2020-08-02 NOTE — Telephone Encounter (Signed)
[name] informed him that Dr.Mansouraty called and spoke with someone from his insurance company on yesterday and we were working on this for him and will be back in touch with him once we have more information. Pt voiced understanding and was thankful for the call.

## 2020-08-10 ENCOUNTER — Other Ambulatory Visit: Payer: Self-pay | Admitting: Internal Medicine

## 2020-08-28 ENCOUNTER — Other Ambulatory Visit: Payer: Self-pay

## 2020-08-29 ENCOUNTER — Ambulatory Visit (INDEPENDENT_AMBULATORY_CARE_PROVIDER_SITE_OTHER): Payer: Medicare Other | Admitting: Internal Medicine

## 2020-08-29 ENCOUNTER — Encounter: Payer: Self-pay | Admitting: Internal Medicine

## 2020-08-29 VITALS — BP 110/68 | HR 58 | Temp 98.0°F | Wt 172.5 lb

## 2020-08-29 DIAGNOSIS — E119 Type 2 diabetes mellitus without complications: Secondary | ICD-10-CM

## 2020-08-29 DIAGNOSIS — G894 Chronic pain syndrome: Secondary | ICD-10-CM | POA: Diagnosis not present

## 2020-08-29 DIAGNOSIS — E785 Hyperlipidemia, unspecified: Secondary | ICD-10-CM | POA: Diagnosis not present

## 2020-08-29 LAB — POCT GLYCOSYLATED HEMOGLOBIN (HGB A1C): Hemoglobin A1C: 5.8 % — AB (ref 4.0–5.6)

## 2020-08-29 NOTE — Progress Notes (Signed)
Established Patient Office Visit     This visit occurred during the SARS-CoV-2 public health emergency.  Safety protocols were in place, including screening questions prior to the visit, additional usage of staff PPE, and extensive cleaning of exam room while observing appropriate contact time as indicated for disinfecting solutions.    CC/Reason for Visit: 42-monthfollow-up chronic medical conditions  HPI: Jacob BARKLEYis a 80y.o. male who is coming in today for the above mentioned reasons. Past Medical History is significant for: Type 2 diabetes that has been well controlled, heart failure with preserved ejection fraction, chronic pain syndrome with multiple joint pain including neck, shoulders, hands, hips, knees.  He has a history of ascending cholangitis and pancreatitis due to biliary strictures, choledocholithiasis followed by Dr. MRush Landmark  He is on a chronic pain contract with me for oxycodone for which he takes half a tablet 3 times a day.  He is desperate.  He is a very pleasant and tries to be active however states "every step that I take hurts".  He is requesting referral to rheumatology.   Past Medical/Surgical History: Past Medical History:  Diagnosis Date   Allergy    B12 deficiency anemia    Blood transfusion without reported diagnosis    CAD (coronary artery disease)    Cancer (HCC)    bladder-    Colon polyps    COPD (chronic obstructive pulmonary disease) (HCC)    Depression    Esophagus, Barrett's    GERD (gastroesophageal reflux disease)    History of bladder cancer    Bladder cancer "8 times"   History of hiatal hernia    Hyperlipidemia    Hypertension    Localized osteoarthrosis, lower leg    [place]   Restless leg syndrome    Sleep apnea    does not wear cpap   Stenosis of esophagus     Past Surgical History:  Procedure Laterality Date   BILIARY BRUSHING  04/01/2018   Procedure: BILIARY BRUSHING;  Surgeon:  MIrving Copas, MD;  [facility]  Service: Gastroenterology;;   BILIARY BRUSHING  09/12/2018   Procedure: BILIARY BRUSHING;  Surgeon: MIrving Copas, MD;  [facility]  Service: Gastroenterology;;   BILIARY BRUSHING  11/28/2018   Procedure: BILIARY BRUSHING;  Surgeon: MIrving Copas, MD;  [place]  Service: Gastroenterology;;   BILIARY BRUSHING  03/11/2020   Procedure: BILIARY BRUSHING;  Surgeon: MIrving Copas, MD;  [facility];  Service: Gastroenterology;;   BILIARY DILATION  09/12/2018   Procedure: BILIARY DILATION;  Surgeon: MIrving Copas, MD;  [place]  Service: Gastroenterology;;   BILIARY DILATION  11/28/2018   Procedure: BILIARY DILATION;  Surgeon: MIrving Copas, MD;  [place]  Service: Gastroenterology;;   BILIARY DILATION  03/11/2020   Procedure: BILIARY DILATION;  Surgeon: MIrving Copas, MD;  [facility];  Service: Gastroenterology;;   BILIARY STENT PLACEMENT  04/01/2018   Procedure: BILIARY STENT PLACEMENT;  Surgeon: MIrving Copas, MD;  [place] [facility] [place]  Service: Gastroenterology;;   BILIARY STENT PLACEMENT  09/12/2018   Procedure: BILIARY STENT PLACEMENT;  Surgeon: MIrving Copas, MD;  [place]  Service: Gastroenterology;;   BILIARY STENT PLACEMENT  11/28/2018   Procedure: BILIARY STENT PLACEMENT;  Surgeon: MIrving Copas, MD;  [place]  Service: Gastroenterology;;   BIOPSY  04/01/2018   Procedure: BIOPSY;  Surgeon: MIrving Copas,  MD;  Location: [place];  Service: Gastroenterology;;   BIOPSY  09/12/2018   Procedure: BIOPSY;  Surgeon: Irving Copas., MD;  Location: [place];  Service: Gastroenterology;;   BIOPSY  11/28/2018   Procedure: BIOPSY;  Surgeon: Irving Copas., MD;  Location: [place];  Service: Gastroenterology;;   BIOPSY   03/11/2020   Procedure: BIOPSY;  Surgeon: Irving Copas., MD;  [facility];  Service: Gastroenterology;;   bladder cancer      x 8 cystoscopy   CERVICAL DISCECTOMY     ACDF   COLONOSCOPY  11/17/2005   normal    CORONARY ARTERY BYPASS GRAFT     x4   CORONARY STENT INTERVENTION N/A 01/05/2019   Procedure: CORONARY STENT INTERVENTION;  Surgeon: Nelva Bush, MD;  Location: [place] [facility];  Service: Cardiovascular;  Laterality: N/A;   ENDOSCOPIC MUCOSAL RESECTION  09/12/2018   Procedure: ENDOSCOPIC MUCOSAL RESECTION;  Surgeon: Rush Landmark, Telford Nab., MD;  [facility];  Service: Gastroenterology;;   ENDOSCOPIC RETROGRADE CHOLANGIOPANCREATOGRAPHY (ERCP) WITH PROPOFOL N/A 04/01/2018   Procedure: ENDOSCOPIC RETROGRADE CHOLANGIOPANCREATOGRAPHY (ERCP) WITH PROPOFOL;  Surgeon: Irving Copas., MD;  Location: [place];  Service: Gastroenterology;  Laterality: N/A;   ENDOSCOPIC RETROGRADE CHOLANGIOPANCREATOGRAPHY (ERCP) WITH PROPOFOL N/A 09/12/2018   Procedure: ENDOSCOPIC RETROGRADE CHOLANGIOPANCREATOGRAPHY (ERCP) WITH PROPOFOL;  Surgeon: Rush Landmark Telford Nab., MD;  Location: [place];  Service: Gastroenterology;  Laterality: N/A;   ENDOSCOPIC RETROGRADE CHOLANGIOPANCREATOGRAPHY (ERCP) WITH PROPOFOL N/A 03/11/2020   Procedure: ENDOSCOPIC RETROGRADE CHOLANGIOPANCREATOGRAPHY (ERCP) WITH PROPOFOL;  Surgeon: Rush Landmark Telford Nab., MD;  [facility];  Service: Gastroenterology;  Laterality: N/A;   ENDOSCOPIC RETROGRADE CHOLANGIOPANCREATOGRAPHY (ERCP) WITH PROPOFOL N/A 06/03/2020   Procedure: ENDOSCOPIC RETROGRADE CHOLANGIOPANCREATOGRAPHY (ERCP) WITH PROPOFOL;  Surgeon: Rush Landmark Telford Nab., MD;  [facility];  Service: Gastroenterology;  Laterality: N/A;   ERCP N/A 11/28/2018   Procedure: ENDOSCOPIC RETROGRADE CHOLANGIOPANCREATOGRAPHY (ERCP) +EGD with spyglass;  Surgeon: Rush Landmark Telford Nab., MD;  Location: [place];  Service:  Gastroenterology;  Laterality: N/A;   ESOPHAGOGASTRODUODENOSCOPY  04/29/2010   ESOPHAGOGASTRODUODENOSCOPY (EGD) WITH PROPOFOL N/A 04/01/2018   Procedure: ESOPHAGOGASTRODUODENOSCOPY (EGD) WITH PROPOFOL;  Surgeon: Rush Landmark Telford Nab., MD;  Location: [place];  Service: Gastroenterology;  Laterality: N/A;   ESOPHAGOGASTRODUODENOSCOPY (EGD) WITH PROPOFOL N/A 09/12/2018   Procedure: ESOPHAGOGASTRODUODENOSCOPY (EGD) WITH PROPOFOL;  Surgeon: Rush Landmark Telford Nab., MD;  Location: [place];  Service: Gastroenterology;  Laterality: N/A;   ESOPHAGOGASTRODUODENOSCOPY (EGD) WITH PROPOFOL N/A 11/28/2018   Procedure: ESOPHAGOGASTRODUODENOSCOPY (EGD) WITH PROPOFOL;  Surgeon: Rush Landmark Telford Nab., MD;  Location: [place];  Service: Gastroenterology;  Laterality: N/A;   ESOPHAGOGASTRODUODENOSCOPY (EGD) WITH PROPOFOL N/A 03/11/2020   Procedure: ESOPHAGOGASTRODUODENOSCOPY (EGD) WITH PROPOFOL;  Surgeon: Rush Landmark Telford Nab., MD;  [facility];  Service: Gastroenterology;  Laterality: N/A;   ESOPHAGOGASTRODUODENOSCOPY (EGD) WITH PROPOFOL N/A 06/03/2020   Procedure: ESOPHAGOGASTRODUODENOSCOPY (EGD) WITH PROPOFOL;  Surgeon: Rush Landmark Telford Nab., MD;  [facility];  Service: Gastroenterology;  Laterality: N/A;   EUS  04/01/2018   Procedure: FULL UPPER ENDOSCOPIC ULTRASOUND (EUS) RADIAL;  Surgeon: Irving Copas., MD;  Location: [place];  Service: Gastroenterology;;   EUS N/A 09/12/2018   Procedure: UPPER ENDOSCOPIC ULTRASOUND (EUS) RADIAL;  Surgeon: Irving Copas., MD;  Location: [place];  Service: Gastroenterology;  Laterality: N/A;   FINE NEEDLE ASPIRATION  09/12/2018   Procedure: FINE NEEDLE ASPIRATION (FNA) LINEAR;  Surgeon: Irving Copas., MD;  [facility];  Service: Gastroenterology;;   HAND SURGERY Left 2018   saw accident  HEMOSTASIS CLIP PLACEMENT  09/12/2018   Procedure: HEMOSTASIS CLIP PLACEMENT;  Surgeon: Irving Copas., MD;  Location[place];  Service: Gastroenterology;;   HEMOSTASIS CLIP PLACEMENT  06/03/2020   Procedure: HEMOSTASIS CLIP PLACEMENT;  Surgeon: Irving Copas., MD;  [facility];  Service: Gastroenterology;;   I & D EXTREMITY Left 11/24/2016   Procedure: IRRIGATION AND DEBRIDEMENT LEFT HAND, THUMB, INDEX, MIDDLE, RING, AND SMALL FINGERS WITH RECONSTRUCTION;  Surgeon: Roseanne Kaufman, MD;  Location: [place];  Service: Orthopedics;  Laterality: Left;   KNEE ARTHROSCOPY Left    LEFT HEART CATH AND CORS/GRAFTS ANGIOGRAPHY N/A 01/05/2019   Procedure: LEFT HEART CATH AND CORS/GRAFTS ANGIOGRAPHY;  Surgeon: Nelva Bush, MD;  Location: [place] [facility];  Service: Cardiovascular;  Laterality: N/A;   LUMBAR LAMINECTOMY     and fusion x 2   NASAL SINUS SURGERY     POPLITEAL SYNOVIAL CYST EXCISION     REMOVAL OF STONES  04/01/2018   Procedure: REMOVAL OF STONES;  Surgeon: Rush Landmark Telford Nab., MD;  Location: [place];  Service: Gastroenterology;;   REMOVAL OF STONES  09/12/2018   Procedure: REMOVAL OF STONES;  Surgeon: Irving Copas., MD;  Location: [place];  Service: Gastroenterology;;   REMOVAL OF STONES  11/28/2018   Procedure: REMOVAL OF STONES;  Surgeon: Irving Copas., MD;  Location: [place];  Service: Gastroenterology;;   REMOVAL OF STONES  03/11/2020   Procedure: REMOVAL OF STONES;  Surgeon: Irving Copas., MD;  [facility];  Service: Gastroenterology;;   REMOVAL OF STONES  06/03/2020   Procedure: REMOVAL OF STONES;  Surgeon: Irving Copas., MD;  [facility];  Service: Gastroenterology;;   Azzie Almas DILATION N/A 09/12/2018   Procedure: Renato Battles;  Surgeon: Irving Copas., MD;  Location: [place];  Service: Gastroenterology;  Laterality: N/A;   SAVORY DILATION N/A 11/28/2018   Procedure: SAVORY DILATION;  Surgeon: Rush Landmark Telford Nab., MD;  Location: [place];  Service:  Gastroenterology;  Laterality: N/A;   SPHINCTEROTOMY  04/01/2018   Procedure: SPHINCTEROTOMY;  Surgeon: Mansouraty, Telford Nab., MD;  Location: [place];  Service: Gastroenterology;;   Bess Kinds CHOLANGIOSCOPY N/A 11/28/2018   Procedure: [id number] CHOLANGIOSCOPY;  Surgeon: Irving Copas., MD;  Location: [place];  Service: Gastroenterology;  Laterality: N/A;   SPYGLASS CHOLANGIOSCOPY N/A 06/03/2020   Procedure: SPYGLASS CHOLANGIOSCOPY;  Surgeon: Irving Copas., MD;  [facility];  Service: Gastroenterology;  Laterality: N/A;   STENT REMOVAL  09/12/2018   Procedure: STENT REMOVAL;  Surgeon: Irving Copas., MD;  Location: [place];  Service: Gastroenterology;;   Lavell Islam REMOVAL  11/28/2018   Procedure: STENT REMOVAL;  Surgeon: Irving Copas., MD;  Location: [place];  Service: Gastroenterology;;   Lavell Islam REMOVAL  03/11/2020   Procedure: STENT REMOVAL;  Surgeon: Irving Copas., MD;  [facility];  Service: Gastroenterology;;   SUBMUCOSAL LIFTING INJECTION  09/12/2018   Procedure: SUBMUCOSAL LIFTING INJECTION;  Surgeon: Irving Copas., MD;  Location: [place];  Service: Gastroenterology;;    Social History:  reports that he quit smoking about 44 years ago. His smoking use included cigarettes. He has a 2.50 pack-year smoking history. He has never used smokeless tobacco. He reports that he does not drink alcohol and does not use drugs.  Allergies: Allergies  Allergen Reactions   Losartan Potassium Other (See Comments)    Hyperkalemia   Tape Itching and Rash    reddened skin     Family History:  Family History  Problem Relation Age of Onset   Melanoma Mother    Stroke Father    Hypertension Father    Coronary artery disease Other    Colon cancer Neg Hx    Esophageal cancer Neg Hx    Stomach cancer Neg Hx    Rectal cancer Neg Hx    Pancreatic cancer Neg Hx    Liver disease Neg Hx    Inflammatory bowel  disease Neg Hx      Current Outpatient Medications:    acetaminophen (TYLENOL) 500 MG tablet, Take 1,000 mg by mouth every 6 (six) hours as needed for moderate pain., Disp: , Rfl:    aspirin EC 81 MG tablet, Take 81 mg by mouth every evening., Disp: , Rfl:    cetirizine (ZYRTEC) 10 MG tablet, Take 10 mg by mouth daily as needed for allergies., Disp: , Rfl:    clopidogrel (PLAVIX) 75 MG tablet, Take 1 tablet (75 mg total) by mouth daily., Disp: 90 tablet, Rfl: 3   Coenzyme Q10 300 MG CAPS, Take 300 mg by mouth every evening., Disp: , Rfl:    cyanocobalamin (,VITAMIN B-12,) 1000 MCG/ML injection, Inject 1 mL (1,000 mcg total) into the skin every 30 (thirty) days., Disp: 6 mL, Rfl: 1   ezetimibe (ZETIA) 10 MG tablet, Take 1 tablet by mouth once daily, Disp: 90 tablet, Rfl: 0   Ferrous Sulfate (IRON) 28 MG TABS, Take 28 mg by mouth daily., Disp: , Rfl:    fluticasone (FLONASE) 50 MCG/ACT nasal spray, Place 1-2 sprays into both nostrils at bedtime as needed for allergies or rhinitis., Disp: , Rfl:    furosemide (LASIX) 40 MG tablet, Take 1 tablet (40 mg total) by mouth every other day. (Patient taking differently: Take 40 mg by mouth daily as needed for fluid or edema.), Disp: 90 tablet, Rfl: 1   guaifenesin (HUMIBID E) 400 MG TABS tablet, Take 400 mg by mouth daily., Disp: , Rfl:    hyoscyamine (LEVSIN SL) 0.125 MG SL tablet, Place 1 tablet (0.125 mg total) under the tongue every 4 (four) hours as needed. (Patient taking differently: Place 0.125 mg under the tongue every 4 (four) hours as needed (spasms).), Disp: 30 tablet, Rfl: 2   isosorbide mononitrate (IMDUR) 60 MG 24 hr tablet, Take 1 tablet by mouth once daily (Patient taking differently: Take 60 mg by mouth daily.), Disp: 90 tablet, Rfl: 3   Menthol, Topical Analgesic, (BENGAY EX), Apply 1 application topically daily as needed (pain)., Disp: , Rfl:    METAMUCIL FIBER PO, Take 3 capsules by mouth daily., Disp: , Rfl:    Multiple  Vitamins-Minerals (MULTIVITAMIN WITH MINERALS) tablet, Take 1 tablet by mouth daily., Disp: , Rfl:    Naphazoline-Pheniramine (OPCON-A) 0.027-0.315 % SOLN, Place 1 drop into both eyes daily as needed (itching eyes)., Disp: , Rfl:    nebivolol (BYSTOLIC) 10 MG tablet, Take 1 tablet by mouth once daily, Disp: 90 tablet, Rfl: 0   NEEDLE, DISP, 25 G (B-D DISP NEEDLE 25GX1") 25G X 1" MISC, Inject 1000 mcg into muscle once a month., Disp: 50 each, Rfl: 0   nitroGLYCERIN (NITROSTAT) 0.4 MG SL tablet, PLACE 1 TABLET UNDER THE TONGUE EVERY 5 MINUTES AS NEEDED FOR CHEST PAIN, Disp: 25 tablet, Rfl: 2   omeprazole (PRILOSEC) 40 MG capsule, Take 40 mg by mouth every evening., Disp: , Rfl:    oxyCODONE-acetaminophen (PERCOCET) 10-325 MG tablet, Take 1 tablet by mouth 2 (two) times daily as needed for pain.,  Disp: 60 tablet, Rfl: 0   oxyCODONE-acetaminophen (PERCOCET) 10-325 MG tablet, Take 1 tablet by mouth 2 (two) times daily as needed for pain., Disp: 60 tablet, Rfl: 0   Probiotic Product (PROBIOTIC ADVANCED PO), Take 1 tablet by mouth every evening., Disp: , Rfl:    rOPINIRole (REQUIP) 2 MG tablet, TAKE 1 TABLET BY MOUTH AT BEDTIME, Disp: 90 tablet, Rfl: 0   rosuvastatin (CRESTOR) 10 MG tablet, Take 1 tablet by mouth once daily, Disp: 90 tablet, Rfl: 0   venlafaxine (EFFEXOR) 75 MG tablet, Take 1 tablet by mouth once daily, Disp: 90 tablet, Rfl: 1   amLODipine (NORVASC) 2.5 MG tablet, TAKE 2 TABLETS BY MOUTH DAILY (Patient taking differently: Take 5 mg by mouth daily.), Disp: 90 tablet, Rfl: 8  Review of Systems:  Constitutional: Denies fever, chills, diaphoresis, appetite change and fatigue.  HEENT: Denies photophobia, eye pain, redness, hearing loss, ear pain, congestion, sore throat, rhinorrhea, sneezing, mouth sores, trouble swallowing, neck pain, neck stiffness and tinnitus.   Respiratory: Denies SOB, DOE, cough, chest tightness,  and wheezing.   Cardiovascular: Denies chest pain, palpitations and leg  swelling.  Gastrointestinal: Denies nausea, vomiting, abdominal pain, diarrhea, constipation, blood in stool and abdominal distention.  Genitourinary: Denies dysuria, urgency, frequency, hematuria, flank pain and difficulty urinating.  Endocrine: Denies: hot or cold intolerance, sweats, changes in hair or nails, polyuria, polydipsia. Musculoskeletal: Positive for myalgias, back pain, joint swelling, arthralgias and gait problem.  Skin: Denies pallor, rash and wound.  Neurological: Denies dizziness, seizures, syncope, weakness, light-headedness, numbness and headaches.  Hematological: Denies adenopathy. Easy bruising, personal or family bleeding history  Psychiatric/Behavioral: Denies suicidal ideation, mood changes, confusion, nervousness, sleep disturbance and agitation    Physical Exam: Vitals:   08/29/20 0939  BP: 110/68  Pulse: (!) 58  Temp: 98 F (36.7 C)  TempSrc: Oral  SpO2: 97%  Weight: 172 lb 8 oz (78.2 kg)    Body mass index is 25.47 kg/m.   Constitutional: NAD, calm, comfortable Eyes: PERRL, lids and conjunctivae normal ENMT: Mucous membranes are moist.  Respiratory: clear to auscultation bilaterally, no wheezing, no crackles. Normal respiratory effort. No accessory muscle use.  Cardiovascular: Regular rate and rhythm, no murmurs / rubs / gallops. No extremity edema.  Psychiatric: Normal judgment and insight. Alert and oriented x 3. Normal mood.    Impression and Plan:  Diabetes mellitus type 2, noninsulin dependent ([place])  -In office A1c today demonstrates good control at 5.8.  Hyperlipidemia, unspecified hyperlipidemia type -On rosuvastatin with an LDL of 56 as of July 2021.  Chronic pain syndrome -With multiple joint pain. -He is not due for oxycodone refills today. -He is requesting referral to rheumatology.  Time spent: 32 minutes reviewing chart, interviewing and examining patient and formulating plan of care.     [name],  MD [facility]

## 2020-09-06 ENCOUNTER — Telehealth: Payer: Self-pay | Admitting: Pharmacist

## 2020-09-06 NOTE — Chronic Care Management (AMB) (Signed)
Chronic Care Management Pharmacy Assistant   Name: [name]  MRN: [id number] DOB: Nov 18, 1940  Reason for Encounter: Disease State/ General Assessment Call.   Conditions to be addressed/monitored: HTN and HLD   Recent office visits:  08/29/20 Domingo Mend MD (PCP) - seen for diabetes mellitus type 2, noninsulin dependant and other issues. Referral to rheumatology placed. No medication changes. Follow up in 3 months.  05/29/20 Domingo Mend MD (PCP) - een for diabetes mellitus type 2, noninsulin dependant and other issues. No medication changes. Follow up in 3 months.  Recent consult visits:  None.   Hospital visits:  Medication Reconciliation was completed by comparing discharge summary, patient's EMR and Pharmacy list, and upon discussion with patient.  Patient visited [facility] on 06/03/20 for 4 hours due to having procedure endoscopic retrograde cholangiopancreatography with propofol.  New?Medications Started at [facility] Discharge:?? -started None.  Medication Changes at Hospital Discharge: -Changed plavix.  Medications Discontinued at Hospital Discharge: -Stopped None.   Medications that remain the same after Hospital Discharge:??  -All other medications will remain the same.    Medications: Outpatient Encounter Medications as of 09/06/2020  Medication Sig   acetaminophen (TYLENOL) 500 MG tablet Take 1,000 mg by mouth every 6 (six) hours as needed for moderate pain.   amLODipine (NORVASC) 2.5 MG tablet TAKE 2 TABLETS BY MOUTH DAILY (Patient taking differently: Take 5 mg by mouth daily.)   aspirin EC 81 MG tablet Take 81 mg by mouth every evening.   cetirizine (ZYRTEC) 10 MG tablet Take 10 mg by mouth daily as needed for allergies.   clopidogrel (PLAVIX) 75 MG tablet Take 1 tablet (75 mg total) by mouth daily.   Coenzyme Q10 300 MG CAPS Take 300 mg by mouth every evening.   cyanocobalamin (,VITAMIN B-12,) 1000 MCG/ML injection Inject  1 mL (1,000 mcg total) into the skin every 30 (thirty) days.   ezetimibe (ZETIA) 10 MG tablet Take 1 tablet by mouth once daily   Ferrous Sulfate (IRON) 28 MG TABS Take 28 mg by mouth daily.   fluticasone (FLONASE) 50 MCG/ACT nasal spray Place 1-2 sprays into both nostrils at bedtime as needed for allergies or rhinitis.   furosemide (LASIX) 40 MG tablet Take 1 tablet (40 mg total) by mouth every other day. (Patient taking differently: Take 40 mg by mouth daily as needed for fluid or edema.)   guaifenesin (HUMIBID E) 400 MG TABS tablet Take 400 mg by mouth daily.   hyoscyamine (LEVSIN SL) 0.125 MG SL tablet Place 1 tablet (0.125 mg total) under the tongue every 4 (four) hours as needed. (Patient taking differently: Place 0.125 mg under the tongue every 4 (four) hours as needed (spasms).)   isosorbide mononitrate (IMDUR) 60 MG 24 hr tablet Take 1 tablet by mouth once daily (Patient taking differently: Take 60 mg by mouth daily.)   Menthol, Topical Analgesic, (BENGAY EX) Apply 1 application topically daily as needed (pain).   METAMUCIL FIBER PO Take 3 capsules by mouth daily.   Multiple Vitamins-Minerals (MULTIVITAMIN WITH MINERALS) tablet Take 1 tablet by mouth daily.   Naphazoline-Pheniramine (OPCON-A) 0.027-0.315 % SOLN Place 1 drop into both eyes daily as needed (itching eyes).   nebivolol (BYSTOLIC) 10 MG tablet Take 1 tablet by mouth once daily   NEEDLE, DISP, 25 G (B-D DISP NEEDLE 25GX1") 25G X 1" MISC Inject 1000 mcg into muscle once a month.   nitroGLYCERIN (NITROSTAT) 0.4 MG SL tablet PLACE 1 TABLET UNDER THE TONGUE  EVERY 5 MINUTES AS NEEDED FOR CHEST PAIN   omeprazole (PRILOSEC) 40 MG capsule Take 40 mg by mouth every evening.   oxyCODONE-acetaminophen (PERCOCET) 10-325 MG tablet Take 1 tablet by mouth 2 (two) times daily as needed for pain.   oxyCODONE-acetaminophen (PERCOCET) 10-325 MG tablet Take 1 tablet by mouth 2 (two) times daily as needed for pain.   Probiotic Product (PROBIOTIC  ADVANCED PO) Take 1 tablet by mouth every evening.   rOPINIRole (REQUIP) 2 MG tablet TAKE 1 TABLET BY MOUTH AT BEDTIME   rosuvastatin (CRESTOR) 10 MG tablet Take 1 tablet by mouth once daily   venlafaxine (EFFEXOR) 75 MG tablet Take 1 tablet by mouth once daily   No facility-administered encounter medications on file as of 09/06/2020.   Fill History: CLOPIDOGREL '75MG'$     TAB 07/07/2020 90   CYANOCOBALAM1000MCG INJ 08/02/2020 84   EZETIMIBE '10MG'$  TAB 07/16/2020 90   ISOSORB MONO ER '60MG'$  TAB 08/17/2020 90   NEBIVOLOL '10MG'$       TAB 07/29/2020 90   NITROGLYCER 0.'4MG'$    SUB 08/23/2020 25   OMEPRAZOLE DR '40MG'$   CAP 06/29/2020 90   ROSUVASTATIN '10MG'$  TAB 07/24/2020 90   VENLAFAXINE '75MG'$     TAB 07/09/2020 90   AMLODIPINE 2.'5MG'$  TAB 08/02/2020 45   OXYCOD/ACETAM 10-'325MG'$  TAB 08/01/2020 30   ROPINIROLE '2MG'$  TAB 08/12/2020 90   Reviewed chart prior to disease state call. Spoke with patient regarding BP  Recent Office Vitals: BP Readings from Last 3 Encounters:  08/29/20 110/68  06/03/20 123/74  05/29/20 124/64   Pulse Readings from Last 3 Encounters:  08/29/20 (!) 58  06/03/20 (!) 53  05/29/20 (!) 57    Wt Readings from Last 3 Encounters:  08/29/20 172 lb 8 oz (78.2 kg)  06/03/20 168 lb (76.2 kg)  05/29/20 172 lb 9.6 oz (78.3 kg)     Kidney Function Lab Results  Component Value Date/Time   CREATININE 1.40 (H) 04/09/2020 11:01 AM   CREATININE 1.62 (H) 11/13/2019 10:06 AM   CREATININE 1.61 (H) 11/09/2019 09:39 AM   CREATININE 1.17 12/26/2015 10:34 AM   GFR 41.03 (L) 07/27/2018 04:18 PM   GFRNONAA 40 (L) 11/13/2019 10:06 AM   GFRAA 47 (L) 11/13/2019 10:06 AM    BMP Latest Ref Rng & Units 04/09/2020 11/13/2019 11/09/2019  Glucose 65 - 99 mg/dL - 138(H) 134(H)  BUN 8 - 27 mg/dL - 23 25  Creatinine 0.61 - 1.24 mg/dL 1.40(H) 1.62(H) 1.61(H)  BUN/Creat Ratio 10 - 24 - 14 16  Sodium 134 - 144 mmol/L - 145(H) 143  Potassium 3.5 - 5.2 mmol/L - 4.4 4.5  Chloride 96 - 106 mmol/L  - 101 108  CO2 20 - 29 mmol/L - 30(H) 30  Calcium 8.6 - 10.2 mg/dL - 9.1 8.8    Current antihypertensive regimen:  Amlodipine 2.'5mg'$  - take 2 tablets by mouth daily. Nebivolol '10mg'$  - take 1 tablet by mouth daily.   How often are you checking your Blood Pressure? infrequently Current home BP readings: most of the time he runs 128/65, 129/70, 135/71, 138/68, 139/71, 127/65, 133/71.  What recent interventions/DTPs have been made by any provider to improve Blood Pressure control since last CPP Visit: None. Any recent hospitalizations or ED visits since last visit with CPP? No  Adherence Review: Is the patient currently on ACE/ARB medication? No Does the patient have >5 day gap between last estimated fill dates? No  Comprehensive medication review performed; Spoke to patient regarding cholesterol  Lipid Panel  Component Value Date/Time   CHOL 113 07/20/2019 0850   CHOL 116 03/30/2013 1053   TRIG 145 07/20/2019 0850   TRIG 80 03/30/2013 1053   HDL 32 (L) 07/20/2019 0850   HDL 34 (L) 03/30/2013 1053   LDLCALC 56 07/20/2019 0850   LDLCALC 66 03/30/2013 1053   LDLDIRECT 90.3 04/17/2008 0911    10-year ASCVD risk score: The ASCVD Risk score Mikey Bussing DC Jr., et al., 2013) failed to calculate for the following reasons:   The patient has a prior MI or stroke diagnosis  Current antihyperlipidemic regimen:  Rosuvastatin '10mg'$  - take 1 tablet by mouth daily. Previous antihyperlipidemic medications tried: atorvastatin and simvastatin. ASCVD risk enhancing conditions: age >51, DM, CHF, and current smoker What recent interventions/DTPs have been made by any provider to improve Cholesterol control since last CPP Visit: None. Any recent hospitalizations or ED visits since last visit with CPP? No  Adherence Review: Does the patient have >5 day gap between last estimated fill dates? No  Notes: Spoke with patient and he stated that his daughter is an Therapist, sports and she does his medications for him a week  ahead of time. He does not know the names or exactly what he takes but he's confident in his daughters knowledge. Patient does check his blood pressure here and there and keeps a log with his cuff the pharmacy sent him for free. He reported numbers as above and no issues from his medications that he knows of at this time. Patient stated he eats usually cereal or oatmeal with coffee in the mornings. He eats a lot of sandwiches through out the day and for lunch. 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 Patient states he is eating a lot of tomatoes sandwiches lately ad he does add salt. Patient stated he eats salmon or tuna at least once a week and a lot of chicken if he does eat meat. He usually eats mostly vegetables and no sweets. Patient drinks water and Gatorade all day especially being out mowing 9 acres and tending to his farm. He also restores old cars and does that for a daily hobby. Patient preaches at church as well. He stays very busy. Patient stated he gets plenty of activity and he does hurt and have a lot of pain but he is used to it after having cancer and multiple surgeries throughout the years. Patient scheduled a follow up with Jeni Salles for 11/11/20 at 10am via telephone and requested both of his numbers be called for that appointment due to being so busy all the time. Patient thanked me for my call.   Care Gaps:  AWV - message sent to [name] to schedule. Foot exam - never done Ophthalmology exam - never done Urine microalbumin - never done Hepatitis C screening - never done  Star Rating Drugs:  Rosuvastatin '10mg'$  - last filled on 07/24/20 90DS at [facility].  [place]  Clinical Pharmacist Assistant [phone]

## 2020-09-17 NOTE — Telephone Encounter (Signed)
Pt called inquiring on the status of this request. Pls call him back, try both phone numbers please per pt.

## 2020-09-24 ENCOUNTER — Other Ambulatory Visit: Payer: Self-pay | Admitting: Gastroenterology

## 2020-09-25 NOTE — Telephone Encounter (Signed)
Dr Rush Landmark I have been unable to reach [name], but do you have any updates that I can pass along to him?

## 2020-09-25 NOTE — Telephone Encounter (Signed)
I have not heard anything back since my early August email to the group. I have resent/replied back to see if any update has come from their evaluation. Thanks. GM

## 2020-09-26 NOTE — Telephone Encounter (Signed)
Return call to patient. Had to leave voicemail on answering machine with Dr.Mansouraty response.

## 2020-10-12 ENCOUNTER — Other Ambulatory Visit: Payer: Self-pay | Admitting: Internal Medicine

## 2020-10-22 ENCOUNTER — Other Ambulatory Visit: Payer: Self-pay | Admitting: Cardiovascular Disease

## 2020-10-22 DIAGNOSIS — R0609 Other forms of dyspnea: Secondary | ICD-10-CM

## 2020-10-24 ENCOUNTER — Other Ambulatory Visit: Payer: Self-pay | Admitting: Internal Medicine

## 2020-10-24 DIAGNOSIS — M79642 Pain in left hand: Secondary | ICD-10-CM

## 2020-10-24 DIAGNOSIS — M545 Low back pain, unspecified: Secondary | ICD-10-CM

## 2020-10-24 DIAGNOSIS — G8929 Other chronic pain: Secondary | ICD-10-CM

## 2020-10-24 DIAGNOSIS — M1712 Unilateral primary osteoarthritis, left knee: Secondary | ICD-10-CM

## 2020-11-02 ENCOUNTER — Other Ambulatory Visit: Payer: Self-pay | Admitting: Internal Medicine

## 2020-11-08 ENCOUNTER — Telehealth: Payer: Self-pay | Admitting: Pharmacist

## 2020-11-08 NOTE — Chronic Care Management (AMB) (Signed)
    Chronic Care Management Pharmacy Assistant   Name: [name]  MRN: [id number] DOB: 09-04-1940  11/11/2020 APPOINTMENT [place], spoke with his daughter [name], left message of appointment on 11/11/2020 at 10:00 via telephone visit with Jeni Salles Pharm D. Notified to have all medications, supplements, blood pressure and/or blood sugar logs available during appointment. [name] will forward this message to him.  Care Gaps: AWV - message previously sent to [name] to schedule. Foot exam - never done Ophthalmology exam - never done Urine microalbumin - never done Hepatitis C screening - never done Last BP - 110/68 on 08/29/2020 HGA1C - 5.9 on 02/29/2020  Star Rating Drug: Rosuvastatin 10mg  - last filled on 10/23/2020 90DS at [facility]   Any gaps in medications fill history? No  [place]  Catering manager [phone]

## 2020-11-09 ENCOUNTER — Other Ambulatory Visit: Payer: Self-pay | Admitting: Cardiovascular Disease

## 2020-11-11 ENCOUNTER — Ambulatory Visit (INDEPENDENT_AMBULATORY_CARE_PROVIDER_SITE_OTHER): Payer: Medicare Other | Admitting: Pharmacist

## 2020-11-11 DIAGNOSIS — I10 Essential (primary) hypertension: Secondary | ICD-10-CM

## 2020-11-11 DIAGNOSIS — I5032 Chronic diastolic (congestive) heart failure: Secondary | ICD-10-CM

## 2020-11-11 NOTE — Progress Notes (Signed)
Chronic Care Management Pharmacy Note  11/11/2020 Name:  [name] MRN:  [id number] DOB:  May 18, 1940  Summary: BP is at goal < 140/90 per home and office readings Pt is having more pain than usual  Recommendations/Changes made from today's visit: -Recommended bringing home BP cuff to office for next visit -Recommended switching omeprazole to alternative PPI to avoid drug interaction with Plavix -Provided telephone number for [facility] and recommended setting up appt with previous provider  Plan: Follow up BP assessment in 2-3 months  Subjective: [name] is an 80 y.o. year old male who is a primary patient of Isaac Bliss, Rayford Halsted, MD.  The CCM team was consulted for assistance with disease management and care coordination needs.    Engaged with patient by telephone for follow up visit in response to provider referral for pharmacy case management and/or care coordination services.   Consent to Services:  The patient was given information about Chronic Care Management services, agreed to services, and gave verbal consent prior to initiation of services.  Please see initial visit note for detailed documentation.   Patient Care Team: Isaac Bliss, Rayford Halsted, MD as PCP - General (Internal Medicine) Troy Sine, MD as PCP - Cardiology (Cardiology) Viona Gilmore, [facility] as Pharmacist (Pharmacist)  Recent office visits: 08/29/20 Domingo Mend MD (PCP) - seen for diabetes mellitus type 2, noninsulin dependent and other issues. Referral to rheumatology placed. No medication changes. Follow up in 3 months.   05/29/20 Domingo Mend MD (PCP) - seen for diabetes mellitus type 2, noninsulin dependent and other issues. No medication changes. Follow up in 3 months.  Recent consult visits: 06/18/20 Jarome Matin (dermatology): Patient presented for seborrheic keratosis follow up. Unable to access notes.   Hospital visits: Medication Reconciliation was  completed by comparing discharge summary, patient's EMR and Pharmacy list, and upon discussion with patient.   Patient visited [facility] on 06/03/20 for 4 hours due to having procedure endoscopic retrograde cholangiopancreatography with propofol.   New?Medications Started at [facility] Discharge:?? -started None.   Medication Changes at Hospital Discharge: -Changed plavix.   Medications Discontinued at Hospital Discharge: -Stopped None.    Medications that remain the same after Hospital Discharge:??  -All other medications will remain the same.     Objective:  Lab Results  Component Value Date   CREATININE 1.40 (H) 04/09/2020   BUN 23 11/13/2019   GFR 41.03 (L) 07/27/2018   GFRNONAA 40 (L) 11/13/2019   GFRAA 47 (L) 11/13/2019   NA 145 (H) 11/13/2019   K 4.4 11/13/2019   CALCIUM 9.1 11/13/2019   CO2 30 (H) 11/13/2019   GLUCOSE 138 (H) 11/13/2019    Lab Results  Component Value Date/Time   HGBA1C 5.8 (A) 08/29/2020 09:42 AM   HGBA1C 5.8 (A) 05/29/2020 10:10 AM   HGBA1C 7.0 (H) 03/31/2018 01:09 AM   GFR 41.03 (L) 07/27/2018 04:18 PM   GFR 46.94 (L) 05/18/2018 03:19 PM    Last diabetic Eye exam: No results found for: HMDIABEYEEXA  Last diabetic Foot exam: No results found for: HMDIABFOOTEX   Lab Results  Component Value Date   CHOL 113 07/20/2019   HDL 32 (L) 07/20/2019   LDLCALC 56 07/20/2019   LDLDIRECT 90.3 04/17/2008   TRIG 145 07/20/2019   CHOLHDL 3.5 07/20/2019    Hepatic Function Latest Ref Rng & Units 03/22/2020 11/09/2019 11/09/2019  Total Protein 6.0 - 8.3 g/dL 7.1 6.9 6.6  Albumin 3.5 - 5.2 g/dL 3.7  3.7 -  AST 0 - 37 U/L $Remo'17 18 17  'PZasa$ ALT 0 - 53 U/L 9 8 7(L)  Alk Phosphatase 39 - 117 U/L 67 70 -  Total Bilirubin 0.2 - 1.2 mg/dL 0.4 0.4 0.4  Bilirubin, Direct 0.0 - 0.3 mg/dL 0.1 0.1 -    Lab Results  Component Value Date/Time   TSH 3.040 07/20/2019 08:50 AM   TSH 3.05 07/27/2018 04:18 PM    CBC Latest Ref Rng & Units 11/09/2019  07/20/2019 05/10/2019  WBC 3.8 - 10.8 Thousand/uL 7.2 8.7 6.2  Hemoglobin 13.2 - 17.1 g/dL 9.7(L) 11.4(L) 11.6(L)  Hematocrit 38.5 - 50.0 % 31.0(L) 33.8(L) 36.6(L)  Platelets 140 - 400 Thousand/uL 172 177 168    Lab Results  Component Value Date/Time   VD25OH 28.9 (L) 07/20/2019 08:50 AM    Clinical ASCVD: Yes  The ASCVD Risk score (Arnett DK, et al., 2019) failed to calculate for the following reasons:   The patient has a prior MI or stroke diagnosis    Depression screen [facility] 2/9 11/09/2019 12/24/2017 07/02/2016  Decreased Interest 0 0 0  Down, Depressed, Hopeless 0 1 0  PHQ - 2 Score 0 1 0  Altered sleeping 0 0 -  Tired, decreased energy 0 0 -  Change in appetite 0 0 -  Feeling bad or failure about yourself  0 0 -  Trouble concentrating 0 0 -  Moving slowly or fidgety/restless 0 0 -  Suicidal thoughts 0 0 -  PHQ-9 Score 0 1 -  Difficult doing work/chores Not difficult at all - -  Some recent data might be hidden     Social History   Tobacco Use  Smoking Status Former   Packs/day: 0.50   Years: 5.00   Pack years: 2.50   Types: Cigarettes   Quit date: 03/30/1976   Years since quitting: 44.6  Smokeless Tobacco Never   BP Readings from Last 3 Encounters:  08/29/20 110/68  06/03/20 123/74  05/29/20 124/64   Pulse Readings from Last 3 Encounters:  08/29/20 (!) 58  06/03/20 (!) 53  05/29/20 (!) 57   Wt Readings from Last 3 Encounters:  08/29/20 172 lb 8 oz (78.2 kg)  06/03/20 168 lb (76.2 kg)  05/29/20 172 lb 9.6 oz (78.3 kg)   BMI Readings from Last 3 Encounters:  08/29/20 25.47 kg/m  06/03/20 24.81 kg/m  05/29/20 25.49 kg/m    Assessment/Interventions: Review of patient past medical history, allergies, medications, health status, including review of consultants reports, laboratory and other test data, was performed as part of comprehensive evaluation and provision of chronic care management services.   SDOH:  (Social Determinants of Health) assessments and  interventions performed: No  SDOH Screenings   Alcohol Screen: Not on file  Depression (PHQ2-9): Not on file  Financial Resource Strain: Not on file  Food Insecurity: Not on file  Housing: Not on file  Physical Activity: Not on file  Social Connections: Not on file  Stress: Not on file  Tobacco Use: Medium Risk   Smoking Tobacco Use: Former   Smokeless Tobacco Use: Never   Passive Exposure: Not on file  Transportation Needs: Not on file    Central  Allergies  Allergen Reactions   Losartan Potassium Other (See Comments)    Hyperkalemia   Tape Itching and Rash    reddened skin     Medications Reviewed Today     Reviewed by Isaac Bliss, Rayford Halsted, MD (Physician) on 08/29/20 at 1003  Med List Status: <None>   Medication Order Taking? Sig Documenting Provider Last Dose Status Informant  acetaminophen (TYLENOL) 500 MG tablet [id number] Yes Take 1,000 mg by mouth every 6 (six) hours as needed for moderate pain. [provider] Taking Active Family Member  amLODipine (NORVASC) 2.5 MG tablet [id number] No TAKE 2 TABLETS BY MOUTH DAILY  Patient taking differently: Take 5 mg by mouth daily.   Troy Sine, MD Unknown Active   aspirin EC 81 MG tablet [id number] Yes Take 81 mg by mouth every evening. [provider] Taking Active Family Member  cetirizine (ZYRTEC) 10 MG tablet [id number] Yes Take 10 mg by mouth daily as needed for allergies. [provider] Taking Active Family Member  clopidogrel (PLAVIX) 75 MG tablet [id number] Yes Take 1 tablet (75 mg total) by mouth daily. Mansouraty, Telford Nab., MD Taking Active   Coenzyme Q10 300 MG CAPS [id number] Yes Take 300 mg by mouth every evening. [provider] Taking Active Family Member  cyanocobalamin (,VITAMIN B-12,) 1000 MCG/ML injection [id number] Yes Inject 1 mL (1,000 mcg total) into the skin every 30 (thirty) days. Isaac Bliss, Rayford Halsted, MD Taking Active   ezetimibe (ZETIA) 10 MG  tablet [id number] Yes Take 1 tablet by mouth once daily Isaac Bliss, Rayford Halsted, MD Taking Active   Ferrous Sulfate (IRON) 28 MG TABS [id number] Yes Take 28 mg by mouth daily. [provider] Taking Active Family Member  fluticasone (FLONASE) 50 MCG/ACT nasal spray [id number] Yes Place 1-2 sprays into both nostrils at bedtime as needed for allergies or rhinitis. [provider] Taking Active Family Member  furosemide (LASIX) 40 MG tablet [id number] Yes Take 1 tablet (40 mg total) by mouth every other day.  Patient taking differently: Take 40 mg by mouth daily as needed for fluid or edema.   Deberah Pelton, NP Taking Active   guaifenesin (HUMIBID E) 400 MG TABS tablet [id number] Yes Take 400 mg by mouth daily. [provider] Taking Active Family Member  hyoscyamine (LEVSIN SL) 0.125 MG SL tablet [id number] Yes Place 1 tablet (0.125 mg total) under the tongue every 4 (four) hours as needed.  Patient taking differently: Place 0.125 mg under the tongue every 4 (four) hours as needed (spasms).   Mansouraty, Telford Nab., MD Taking Active   isosorbide mononitrate (IMDUR) 60 MG 24 hr tablet [id number] Yes Take 1 tablet by mouth once daily  Patient taking differently: Take 60 mg by mouth daily.   Troy Sine, MD Taking Active   Menthol, Topical Analgesic, [facility] EX) [id number] Yes Apply 1 application topically daily as needed (pain). [provider] Taking Active Family Member  METAMUCIL FIBER PO [id number] Yes Take 3 capsules by mouth daily. [provider] Taking Active Family Member  Multiple Vitamins-Minerals (MULTIVITAMIN WITH MINERALS) tablet [id number] Yes Take 1 tablet by mouth daily. [provider] Taking Active Family Member  Naphazoline-Pheniramine (OPCON-A) 0.027-0.315 % SOLN [id number] Yes Place 1 drop into both eyes daily as needed (itching eyes). [provider] Taking Active Family Member  nebivolol (BYSTOLIC) 10 MG tablet  [id number] Yes Take 1 tablet by mouth once daily Troy Sine, MD Taking Active   NEEDLE, DISP, 25 G (B-D DISP NEEDLE 25GX1") 25G X 1" MISC [id number] Yes Inject 1000 mcg into muscle once a month. Isaac Bliss, Rayford Halsted, MD Taking Active Family Member  nitroGLYCERIN (NITROSTAT) 0.4 MG SL tablet [id number] Yes PLACE 1 TABLET UNDER THE TONGUE EVERY 5 MINUTES AS NEEDED FOR CHEST PAIN  Isaac Bliss, Rayford Halsted, MD Taking Active   omeprazole (PRILOSEC) 40 MG capsule [id number] Yes Take 40 mg by mouth every evening. [provider] Taking Active Family Member  oxyCODONE-acetaminophen (PERCOCET) 10-325 MG tablet [id number] Yes Take 1 tablet by mouth 2 (two) times daily as needed for pain. Isaac Bliss, Rayford Halsted, MD Taking Active   oxyCODONE-acetaminophen [facility]) 10-325 MG tablet [id number] Yes Take 1 tablet by mouth 2 (two) times daily as needed for pain. Isaac Bliss, Rayford Halsted, MD Taking Active   Probiotic Product (PROBIOTIC ADVANCED PO) [id number] Yes Take 1 tablet by mouth every evening. [provider] Taking Active Family Member  rOPINIRole (REQUIP) 2 MG tablet [id number] Yes TAKE 1 TABLET BY MOUTH AT BEDTIME Isaac Bliss, Rayford Halsted, MD Taking Active   rosuvastatin (CRESTOR) 10 MG tablet [id number] Yes Take 1 tablet by mouth once daily Troy Sine, MD Taking Active   venlafaxine [facility]) 75 MG tablet [id number] Yes Take 1 tablet by mouth once daily Isaac Bliss, Rayford Halsted, MD Taking Active             Patient Active Problem List   Diagnosis Date Noted   Bloating 04/27/2020   Gas pain 04/27/2020   Abdominal cramping 04/27/2020   Functional abdominal pain syndrome 11/12/2019   Lower extremity edema 11/12/2019   Hyperlipidemia    History of cholecystectomy 01/19/2019   Chronic diarrhea 01/19/2019   Antiplatelet or antithrombotic long-term use 01/19/2019   Non-ST elevation (NSTEMI) myocardial infarction [facility])    Acute hypoxemic respiratory failure ([place])  01/04/2019   Multifocal pneumonia 01/04/2019   Severe sepsis ([place]) 01/04/2019   Elevated troponin 01/04/2019   Acute on chronic diastolic (congestive) heart failure ([place]) 01/04/2019   Acute respiratory failure ([place]) 01/04/2019   elevated IgG4 11/01/2018   Choledocholithiasis 10/27/2018   Dilation of biliary tract 10/27/2018   Esophageal dysphagia 10/27/2018   Upper airway cough syndrome 07/28/2018   Pleural effusion on left 07/28/2018   History of pancreatitis 05/12/2018   Abnormal LFTs 05/12/2018   Abnormal findings on esophagogastroduodenoscopy (EGD) 05/12/2018   History of ERCP 05/12/2018   Anemia 05/12/2018   Biliary stricture    ESBL (extended spectrum beta-lactamase) producing bacteria infection 04/04/2018   Ascending cholangitis 04/01/2018   Diabetes mellitus type 2, noninsulin dependent ([place]) 03/31/2018   Pancreatitis 03/31/2018   Sepsis ([place]) 03/31/2018   Bacteremia due to Gram-negative bacteria 03/31/2018   Abdominal pain    Dilated bile duct    Acute biliary pancreatitis 03/30/2018   Leukocytosis 03/30/2018   CAD (coronary artery disease) 03/30/2018   Osteoarthritis of left knee 10/18/2017   Cervical radiculopathy 10/04/2017   Pain of left hand 07/19/2017   Pre-operative cardiovascular examination 06/16/2017   CKD (chronic kidney disease), stage III 06/16/2017   Carpal tunnel syndrome 05/27/2017   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 05/10/2017   Open fracture of base of middle phalanx of finger 02/17/2017   Laceration of index finger 02/17/2017   Laceration of nail bed of finger 02/17/2017   Laceration of thumb 02/17/2017   Open fracture of distal phalanx of finger 02/17/2017   Open fractures of multiple sites of phalanx of left hand 11/24/2016   B12 deficiency 05/28/2015   GERD (gastroesophageal reflux disease) 11/16/2014   Hyperlipidemia LDL goal <70 11/21/2013   DOE (dyspnea on exertion) 11/21/2013   Other dysphagia 07/14/2013   History of esophageal  stricture 07/14/2013   Hx of CABG 06/05/2013   Leg pain, bilateral 10/18/2012   Restless leg syndrome 06/21/2012  ULNAR NEUROPATHY, LEFT 08/05/2009   Urinary obstruction 10/19/2008   ACTINIC KERATOSIS 06/25/2008   DRY EYE SYNDROME 10/13/2007   CARCINOMA, BLADDER, HX OF 07/02/2007   BARRETTS ESOPHAGUS 05/23/2007   Hypothyroidism 04/08/2007   Other malaise and fatigue 04/08/2007   Essential hypertension 10/12/2006   ANEMIA, B12 DEFICIENCY 09/08/2006   Depression 07/29/2006   NEUROPATHY, IDIOPATHIC PERIPHERAL NEC 07/29/2006   Allergic rhinitis 07/29/2006   LOW BACK PAIN 07/29/2006   COLONIC POLYPS 11/04/2000    Immunization History  Administered Date(s) Administered   Fluad Quad(high Dose 65+) 11/02/2018, 11/09/2019   Influenza Split 10/01/2010, 10/16/2011   Influenza Whole 01/20/2004, 11/24/2006, 10/19/2008, 10/22/2009   Influenza, High Dose Seasonal PF 10/19/2013, 10/22/2014, 09/30/2015, 10/26/2016, 11/19/2017   Influenza,inj,Quad PF,6+ Mos 10/18/2012   Influenza,inj,quad, With Preservative 10/19/2016   Moderna Sars-Covid-2 Vaccination 03/12/2019, 04/03/2019, 12/29/2019   Pfizer Covid-19 Vaccine Bivalent Booster 43yrs & up 10/22/2020   Pneumococcal Conjugate-13 05/29/2013   Pneumococcal Polysaccharide-23 10/22/2009   Tdap 11/24/2016   Tetanus 05/29/2013   Patient is sick to his stomach every morning and feels nauseous. He said this goes away after a BM.  His shoulder and neck pain is severe and can only get around for an hour or two a day. Patient's pain gets worse when the season changes in the fall. His left shoulder got significantly worse recently.  Patient reports stomach problems run in the family. He usually has more issues with diarrhea than constipation. Patient reported that he is having more urinary frequency lately and is going every hour or so.  Conditions to be addressed/monitored:  Hypertension, Hyperlipidemia, Heart Failure, Coronary Artery Disease, GERD,  and Allergic Rhinitis  Conditions addressed this visit: Hypertension, GERD, Heart failure  Care Plan : CCM Pharmacy Care Plan  Updates made by Viona Gilmore, Colp since 11/11/2020 12:00 AM     Problem: Problem: Hypertension, Hyperlipidemia, Heart Failure, Coronary Artery Disease, GERD, and Allergic Rhinitis      Long-Range Goal: Patient-Specific Goal   Start Date: 11/11/2020  Expected End Date: 11/11/2021  This Visit's Progress: On track  Priority: High  Note:   Current Barriers:  Unable to independently monitor therapeutic efficacy  Pharmacist Clinical Goal(s):  Patient will achieve adherence to monitoring guidelines and medication adherence to achieve therapeutic efficacy through collaboration with PharmD and provider.   Interventions: 1:1 collaboration with Isaac Bliss, Rayford Halsted, MD regarding development and update of comprehensive plan of care as evidenced by provider attestation and co-signature Inter-disciplinary care team collaboration (see longitudinal plan of care) Comprehensive medication review performed; medication list updated in electronic medical record  Hypertension (BP goal <140/90) -Controlled -Current treatment: Amlodipine 2.5mg , 2 tablets once daily - in AM nebivolol (Bystolic) 10mg , 1 tablet once daily - in AM -Medications previously tried: losartan  -Current home readings: 130/70, 139/72, 134/69, 138/69, 144/77 (every 2-3 days) - arm cuff (medicare gave it him) - checking  -Current dietary habits: did not discuss -Current exercise habits: minimal due to pain -Denies hypotensive/hypertensive symptoms -Educated on BP goals and benefits of medications for prevention of heart attack, stroke and kidney damage; Importance of home blood pressure monitoring; Proper BP monitoring technique; Symptoms of hypotension and importance of maintaining adequate hydration; -Counseled to monitor BP at home weekly, document, and provide log at future  appointments -Counseled on diet and exercise extensively Recommended to continue current medication  Hyperlipidemia: (LDL goal < 70) -Controlled -Current treatment: Rosuvastatin 20mg , 1 tablet once daily Ezetimibe (Zetia) 10mg , 1 tablet once daily -Medications previously tried:  atorvastatin, simvastatin  -Current dietary patterns: did not discuss -Current exercise habits: minimal with pain -Educated on Cholesterol goals;  Benefits of statin for ASCVD risk reduction; -Counseled on diet and exercise extensively Recommended to continue current medication Recommended repeat lipid panel.  History of NSTEMI (Goal: prevent future heart events) -Controlled -Current treatment  Isosorbide mononitrate 60mg , 1 tablet once daily Nitroglycerin 0.4mg  SL, 1 tablet under tongue every five minutes as needed for chest pain  Aspirin 81mg , 1 tablet once daily Clopidogrel 75mg , 1 tablet once daily -Medications previously tried: none  -Recommended to continue current medication Recommended switching PPI based on drug interaction with Plavix.  Heart Failure (Goal: manage symptoms and prevent exacerbations) -Controlled -Last ejection fraction: 50-55% (Date: 04/10/19) -HF type: Diastolic -NYHA Class: II (slight limitation of activity) -AHA HF Stage: C (Heart disease and symptoms present) -Current treatment: Furosemide 40 mg 1 tablet as needed for edema -Medications previously tried: HCTZ  -Current home BP/HR readings: refer to above -Current dietary habits: did not discuss -Current exercise habits: minimal with pain -Educated on Benefits of medications for managing symptoms and prolonging life Importance of weighing daily; if you gain more than 3 pounds in one day or 5 pounds in one week, call cardiologist. -Counseled on diet and exercise extensively Recommended to continue current medication  Pain (Goal: minimize pain) -Uncontrolled -Current treatment  Acetaminophen 325mg , 2 tablets every six  hours as needed for mild pain (patient reports taking 1 tablet at night occasionally)  Menthol analgesic ([place]ay) apply topically daily as needed for pain Oxycodone- APAP 10/325g, 1 tablet twice daily as needed for pain (patient reports taking 0.5 tablet in the morning lunch, and sometimes at dinner time)  -Medications previously tried: n/a  -Recommended discussing worsening pain with ortho and provided telephone number for previous provider.  Vitamin B12 deficiency (Goal: 211-911) -Controlled -Current treatment  Cyanocobalamin 1058mcg/ml, inject 1 ML into muscle for 1 dose (once a month)  -Medications previously tried: none  -Recommended repeat vitamin B12 level.  Barrett's esophagus/GERD (Goal: minimize symptoms) -Not ideally controlled -Current treatment  Omeprazole 40mg , 1 capsule once daily -Medications previously tried: none  -Recommended switching to pantoprazole to avoid drug interaction with clopidogrel.  Allergic rhinitis (Goal: minimize symptoms) -Controlled -Current treatment  Fluticasone (Flonase) nasal spray, 1 to 2 sprays into both nostrils at bedtime as needed for allergies or rhinitis Loratadine 10mg , 1 tablet once daily  -Medications previously tried: n/a  -Recommended to continue current medication  Restless legs syndrome (Goal: minimize symptoms) -Controlled -Current treatment  Ropinirole 2mg , 1 tablet at bedtime  -Medications previously tried: none  -Recommended to continue current medication  Depression (Goal: minimize symptoms) -Controlled -Current treatment: Venlafaxine 75mg , 1 tablet once daily  -Medications previously tried/failed: n/a -PHQ9: 0 -GAD7: n/a -Educated on Benefits of medication for symptom control Benefits of cognitive-behavioral therapy with or without medication -Recommended to continue current medication  Health Maintenance -Vaccine gaps: shingrix -Current therapy:  Florastor 250mg , 1 capsule twice  Metamucil 3 capsules daily   Multivitamin 1 tablet daily Coenzyme Q10 300 mg capsules daily -Educated on Cost vs benefit of each product must be carefully weighed by individual consumer -Patient is satisfied with current therapy and denies issues -Counseled on limiting use of metamucil as this could be contributing to loose stools.  Patient Goals/Self-Care Activities Patient will:  - take medications as prescribed check blood pressure weekly, document, and provide at future appointments  Follow Up Plan: The care management team will reach out to the patient again over  the next 30 days.         Medication Assistance: None required.  Patient affirms current coverage meets needs.  Compliance/Adherence/Medication fill history: Care Gaps: shingrix, foot exam, eye exam, urine micralbumin, Hep C screening Last BP: 110/68 Last A1c: 5.8  Star-Rating Drugs: Rosuvastatin 10mg  - last filled on 10/23/2020 90DS at [place]   Patient's preferred pharmacy is:  [facility] [address], [place] [address] [place] [place] [address] Phone: [phone] Fax: [phone]  Uses pill box? Yes Pt endorses 95% compliance  We discussed: Current pharmacy is preferred with insurance plan and patient is satisfied with pharmacy services Patient decided to: Continue current medication management strategy  Care Plan and Follow Up Patient Decision:  Patient agrees to Care Plan and Follow-up.  Plan: The care management team will reach out to the patient again over the next 30 days.  Jeni Salles, PharmD, [place] Pharmacist [place] [facility] [place]

## 2020-11-11 NOTE — Patient Instructions (Signed)
Hi [name],  It was great to get to meet you over the telephone! Below is a summary of some of the topics we discussed.   Please reach out to me if you have any questions or need anything before our follow up!  Best, Maddie  Jeni Salles, PharmD, [facility] [place]    Visit Information   Goals Addressed   None    Patient Care Plan: CCM Pharmacy Care Plan     Problem Identified: Problem: Hypertension, Hyperlipidemia, Heart Failure, Coronary Artery Disease, GERD, and Allergic Rhinitis      Long-Range Goal: Patient-Specific Goal   Start Date: 11/11/2020  Expected End Date: 11/11/2021  This Visit's Progress: On track  Priority: High  Note:   Current Barriers:  Unable to independently monitor therapeutic efficacy  Pharmacist Clinical Goal(s):  Patient will achieve adherence to monitoring guidelines and medication adherence to achieve therapeutic efficacy through collaboration with PharmD and provider.   Interventions: 1:1 collaboration with [name], Jacob Halsted, MD regarding development and update of comprehensive plan of care as evidenced by provider attestation and co-signature Inter-disciplinary care team collaboration (see longitudinal plan of care) Comprehensive medication review performed; medication list updated in electronic medical record  Hypertension (BP goal <140/90) -Controlled -Current treatment: Amlodipine 2.5mg , 2 tablets once daily - in AM nebivolol (Bystolic) 10mg , 1 tablet once daily - in AM -Medications previously tried: losartan  -Current home readings: 130/70, 139/72, 134/69, 138/69, 144/77 (every 2-3 days) - arm cuff (medicare gave it him) - checking  -Current dietary habits: did not discuss -Current exercise habits: minimal due to pain -Denies hypotensive/hypertensive symptoms -Educated on BP goals and benefits of medications for prevention of heart attack, stroke and kidney  damage; Importance of home blood pressure monitoring; Proper BP monitoring technique; Symptoms of hypotension and importance of maintaining adequate hydration; -Counseled to monitor BP at home weekly, document, and provide log at future appointments -Counseled on diet and exercise extensively Recommended to continue current medication  Hyperlipidemia: (LDL goal < 70) -Controlled -Current treatment: Rosuvastatin 20mg , 1 tablet once daily Ezetimibe (Zetia) 10mg , 1 tablet once daily -Medications previously tried: atorvastatin, simvastatin  -Current dietary patterns: did not discuss -Current exercise habits: minimal with pain -Educated on Cholesterol goals;  Benefits of statin for ASCVD risk reduction; -Counseled on diet and exercise extensively Recommended to continue current medication Recommended repeat lipid panel.  History of NSTEMI (Goal: prevent future heart events) -Controlled -Current treatment  Isosorbide mononitrate 60mg , 1 tablet once daily Nitroglycerin 0.4mg  SL, 1 tablet under tongue every five minutes as needed for chest pain  Aspirin 81mg , 1 tablet once daily Clopidogrel 75mg , 1 tablet once daily -Medications previously tried: none  -Recommended to continue current medication Recommended switching PPI based on drug interaction with Plavix.  Heart Failure (Goal: manage symptoms and prevent exacerbations) -Controlled -Last ejection fraction: 50-55% (Date: 04/10/19) -HF type: Diastolic -NYHA Class: II (slight limitation of activity) -AHA HF Stage: C (Heart disease and symptoms present) -Current treatment: Furosemide 40 mg 1 tablet as needed for edema -Medications previously tried: HCTZ  -Current home BP/HR readings: refer to above -Current dietary habits: did not discuss -Current exercise habits: minimal with pain -Educated on Benefits of medications for managing symptoms and prolonging life Importance of weighing daily; if you gain more than 3 pounds in one day  or 5 pounds in one week, call cardiologist. -Counseled on diet and exercise extensively Recommended to continue current medication  Pain (Goal: minimize pain) -Uncontrolled -Current treatment  Acetaminophen 325mg , 2 tablets every six hours as needed for mild pain (patient reports taking 1 tablet at night occasionally)  Menthol analgesic (Bengay) apply topically daily as needed for pain Oxycodone- APAP 10/325g, 1 tablet twice daily as needed for pain (patient reports taking 0.5 tablet in the morning lunch, and sometimes at dinner time)  -Medications previously tried: n/a  -Recommended discussing worsening pain with ortho and provided telephone number for previous provider.  Vitamin B12 deficiency (Goal: 211-911) -Controlled -Current treatment  Cyanocobalamin 1078mcg/ml, inject 1 ML into muscle for 1 dose (once a month)  -Medications previously tried: none  -Recommended repeat vitamin B12 level.  Barrett's esophagus/GERD (Goal: minimize symptoms) -Not ideally controlled -Current treatment  Omeprazole 40mg , 1 capsule once daily -Medications previously tried: none  -Recommended switching to pantoprazole to avoid drug interaction with clopidogrel.  Allergic rhinitis (Goal: minimize symptoms) -Controlled -Current treatment  Fluticasone (Flonase) nasal spray, 1 to 2 sprays into both nostrils at bedtime as needed for allergies or rhinitis Loratadine 10mg , 1 tablet once daily  -Medications previously tried: n/a  -Recommended to continue current medication  Restless legs syndrome (Goal: minimize symptoms) -Controlled -Current treatment  Ropinirole 2mg , 1 tablet at bedtime  -Medications previously tried: none  -Recommended to continue current medication  Depression (Goal: minimize symptoms) -Controlled -Current treatment: Venlafaxine 75mg , 1 tablet once daily  -Medications previously tried/failed: n/a -PHQ9: 0 -GAD7: n/a -Educated on Benefits of medication for symptom  control Benefits of cognitive-behavioral therapy with or without medication -Recommended to continue current medication  Health Maintenance -Vaccine gaps: shingrix -Current therapy:  Florastor 250mg , 1 capsule twice  Metamucil 3 capsules daily  Multivitamin 1 tablet daily Coenzyme Q10 300 mg capsules daily -Educated on Cost vs benefit of each product must be carefully weighed by individual consumer -Patient is satisfied with current therapy and denies issues -Counseled on limiting use of metamucil as this could be contributing to loose stools.  Patient Goals/Self-Care Activities Patient will:  - take medications as prescribed check blood pressure weekly, document, and provide at future appointments  Follow Up Plan: The care management team will reach out to the patient again over the next 30 days.        Patient verbalizes understanding of instructions provided today and agrees to view in [place].  The pharmacy team will reach out to the patient again over the next 30 days.   [name], [facility]

## 2020-11-12 ENCOUNTER — Telehealth: Payer: Self-pay

## 2020-11-12 MED ORDER — PANTOPRAZOLE SODIUM 40 MG PO TBEC: 40.0000 mg | DELAYED_RELEASE_TABLET | Freq: Every day | ORAL | 3 refills | Status: DC

## 2020-11-12 NOTE — Telephone Encounter (Signed)
Called patient to make him aware of the medication change from omeprazole to pantoprazole. Patient verbalized his understanding and is aware the new prescription was sent to his pharmacy.  Called pharmacy to deactivate the omeprazole prescription.

## 2020-11-12 NOTE — Telephone Encounter (Signed)
-----   Message from Irving Copas., MD sent at 11/12/2020  1:34 PM EDT ----- Regarding: RE: Potential drug interaction MP, No worries. Chelsey Redondo, can you please transition the patient's omeprazole to similar dose Protonix and let him know that pharmacy and I have discussed this previously. Thanks. GM ----- Message ----- From: Viona Gilmore, [facility] Sent: 11/12/2020  12:40 PM EDT To: Irving Copas., MD Subject: RE: Potential drug interaction                 Ok, would you possibly be able to send this in for him? I will call the patient and make him aware of the change. I don't have the ability to send in prescriptions, otherwise I would.  Thank you! ----- Message ----- From: Irving Copas., MD Sent: 11/11/2020   2:34 PM EDT To: Viona Gilmore, RPH Subject: RE: Potential drug interaction                 MP, If the patient and family feel more comfortable with Protonix, then I would do an equivalent dose and I am okay with that transition. I do not prescribe Plavix for him. Thanks. GM ----- Message ----- From: Viona Gilmore, [facility] Sent: 11/11/2020   2:30 PM EDT To: Irving Copas., MD Subject: Potential drug interaction                     Good afternoon,  I am a clinical pharmacist working in the office with Mr. Jagoda PCP. I had the pleasure of meeting with him and going through his medications and noticed a drug interaction with the Plavix and omeprazole, both of which are being prescribed by you. Would it be possible to change the omeprazole to an alternative PPI such as pantoprazole to avoid this drug interaction? Let me know and I would be happy to communicate any changes to the patient.  Thank you! Maddie  Jeni Salles, PharmD, [place] Pharmacist [place] [facility] [place]

## 2020-11-12 NOTE — Telephone Encounter (Signed)
The prescription has been sent to the pharmacy

## 2020-11-18 DIAGNOSIS — I5032 Chronic diastolic (congestive) heart failure: Secondary | ICD-10-CM | POA: Diagnosis not present

## 2020-11-18 DIAGNOSIS — I10 Essential (primary) hypertension: Secondary | ICD-10-CM | POA: Diagnosis not present

## 2020-11-25 ENCOUNTER — Telehealth: Payer: Self-pay | Admitting: Cardiovascular Disease

## 2020-11-25 NOTE — Telephone Encounter (Signed)
  Pt wanted to speak with Dr. Claiborne Billings or a nurse. He said he will have teeth extractions and wanted to know when he can get off coumadin. Explained dental office needs to submit dental clearance request but he said he doesn't need to do that and request a call back from a nurse or Dr. Claiborne Billings. He said he done this before and he can get it again from Dr. Claiborne Billings.

## 2020-11-25 NOTE — Telephone Encounter (Signed)
Spoke to patient's daughter she stated father needs to have over 4 teeth extracted.Advised I will send message to our pharmacist for advice if needs to hold coumadin.

## 2020-11-26 NOTE — Telephone Encounter (Signed)
Called patient's daughter left message on personal voice mail to call back.

## 2020-11-26 NOTE — Telephone Encounter (Signed)
Pts daughter returning phone call.. please advise.

## 2020-11-26 NOTE — Telephone Encounter (Signed)
I do not see warfarin on his medication list. Only ASA and plavix. We need to confirm what he is taking and the dentist needs to send over a clearance request.

## 2020-11-26 NOTE — Telephone Encounter (Signed)
Spoke to patient's daughter stated father does not take Coumadin,he is taking aspirin 81 mg daily and Clopidogrel 75 mg daily.Advised dentist will need to fax a clearance for teeth extractions.

## 2020-12-03 ENCOUNTER — Ambulatory Visit (INDEPENDENT_AMBULATORY_CARE_PROVIDER_SITE_OTHER): Payer: Medicare Other | Admitting: Internal Medicine

## 2020-12-03 ENCOUNTER — Encounter: Payer: Self-pay | Admitting: Internal Medicine

## 2020-12-03 ENCOUNTER — Telehealth: Payer: Self-pay

## 2020-12-03 VITALS — BP 110/68 | HR 61 | Temp 98.0°F | Wt 178.7 lb

## 2020-12-03 DIAGNOSIS — E119 Type 2 diabetes mellitus without complications: Secondary | ICD-10-CM

## 2020-12-03 DIAGNOSIS — G8929 Other chronic pain: Secondary | ICD-10-CM

## 2020-12-03 DIAGNOSIS — G894 Chronic pain syndrome: Secondary | ICD-10-CM | POA: Diagnosis not present

## 2020-12-03 DIAGNOSIS — E785 Hyperlipidemia, unspecified: Secondary | ICD-10-CM | POA: Diagnosis not present

## 2020-12-03 DIAGNOSIS — M545 Low back pain, unspecified: Secondary | ICD-10-CM

## 2020-12-03 DIAGNOSIS — E1169 Type 2 diabetes mellitus with other specified complication: Secondary | ICD-10-CM

## 2020-12-03 LAB — POCT GLYCOSYLATED HEMOGLOBIN (HGB A1C): Hemoglobin A1C: 6.1 % — AB (ref 4.0–5.6)

## 2020-12-03 MED ORDER — OXYCODONE-ACETAMINOPHEN 10-325 MG PO TABS: 1.0000 | ORAL_TABLET | Freq: Two times a day (BID) | ORAL | 0 refills | Status: DC | PRN

## 2020-12-03 MED ORDER — OXYCODONE-ACETAMINOPHEN 10-325 MG PO TABS
1.0000 | ORAL_TABLET | Freq: Two times a day (BID) | ORAL | 0 refills | Status: DC
Start: 2020-12-03 — End: 2021-03-05

## 2020-12-03 NOTE — Telephone Encounter (Signed)
   [facility] Pre-operative Risk Assessment    Patient Name: [name]  DOB: 16-Dec-1940 MRN: [id number]  HEARTCARE STAFF:  - IMPORTANT!!!!!! Under Visit Info/Reason for Call, type in Other and utilize the format Clearance MM/DD/YY or Clearance TBD. Do not use dashes or single digits. - Please review there is not already an duplicate clearance open for this procedure. - If request is for dental extraction, please clarify the # of teeth to be extracted. - If the patient is currently at the dentist's office, call Pre-Op Callback Staff (MA/nurse) to input urgent request.  - If the patient is not currently in the dentist office, please route to the Pre-Op pool.  Request for surgical clearance:  What type of surgery is being performed? Extraction of remaining teeth #31, 26, 25, 24, 23, 22 with alveoloplasty. Additionally, placement of two implants for implant supported overdenture.  When is this surgery scheduled? TBD  What type of clearance is required (medical clearance vs. Pharmacy clearance to hold med vs. Both)? BOTH  Are there any medications that need to be held prior to surgery and how long? Plavix, Aspirin   Practice name and name of physician performing surgery? [facility]   What is the office phone number? [phone]   7.   What is the office fax number? [phone] (attention patient care coordinator)  8.   Anesthesia type (None, local, MAC, general) ? Unknown   Ena Dawley 12/03/2020, 10:27 AM  _________________________________________________________________   (provider comments below)

## 2020-12-03 NOTE — Progress Notes (Signed)
Established Patient Office Visit     This visit occurred during the SARS-CoV-2 public health emergency.  Safety protocols were in place, including screening questions prior to the visit, additional usage of staff PPE, and extensive cleaning of exam room while observing appropriate contact time as indicated for disinfecting solutions.    CC/Reason for Visit: 63-month follow-up chronic medical conditions  HPI: [name] is a 80 y.o. male who is coming in today for the above mentioned reasons. Past Medical History is significant for: Type 2 diabetes that has been well controlled, heart failure with preserved ejection fraction, chronic pain syndrome with multiple joint pain including neck, shoulders, hands, hips, knees.  He has a history of ascending cholangitis and pancreatitis due to biliary strictures, choledocholithiasis followed by Dr. Rush Landmark.  He is on a chronic pain contract with me for oxycodone for which he takes half a tablet 3 times a day.  His main complaint continues to be diffuse joint achiness.  He had requested a rheumatology referral and this was only able to be scheduled for January so he has not seen them yet.  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   Past Medical/Surgical History: Past Medical History:  Diagnosis Date   Allergy    B12 deficiency anemia    Blood transfusion without reported diagnosis    CAD (coronary artery disease)    Cancer (HCC)    bladder-    Colon polyps    COPD (chronic obstructive pulmonary disease) (HCC)    Depression    Esophagus, Barrett's    GERD (gastroesophageal reflux disease)    History of bladder cancer    Bladder cancer "8 times"   History of hiatal hernia    Hyperlipidemia    Hypertension    Localized osteoarthrosis, lower leg    Myocardial infarction ([place]) 2021   Restless leg syndrome    Sleep apnea    does not wear cpap   Stenosis of esophagus     Past Surgical  History:  Procedure Laterality Date   BILIARY BRUSHING  04/01/2018   Procedure: BILIARY BRUSHING;  Surgeon: Irving Copas., MD;  Location: [facility] ENDOSCOPY;  Service: Gastroenterology;;   BILIARY BRUSHING  09/12/2018   Procedure: BILIARY BRUSHING;  Surgeon: Irving Copas., MD;  Location: [facility] ENDOSCOPY;  Service: Gastroenterology;;   BILIARY BRUSHING  11/28/2018   Procedure: BILIARY BRUSHING;  Surgeon: Irving Copas., MD;  Location: [place];  Service: Gastroenterology;;   BILIARY BRUSHING  03/11/2020   Procedure: BILIARY BRUSHING;  Surgeon: Irving Copas., MD;  Location: [facility];  Service: Gastroenterology;;   BILIARY DILATION  09/12/2018   Procedure: BILIARY DILATION;  Surgeon: Irving Copas., MD;  Location: [place];  Service: Gastroenterology;;   BILIARY DILATION  11/28/2018   Procedure: BILIARY DILATION;  Surgeon: Irving Copas., MD;  Location: [place];  Service: Gastroenterology;;   BILIARY DILATION  03/11/2020   Procedure: BILIARY DILATION;  Surgeon: Irving Copas., MD;  Location: [facility];  Service: Gastroenterology;;   BILIARY STENT PLACEMENT  04/01/2018   Procedure: BILIARY STENT PLACEMENT;  Surgeon: Irving Copas., MD;  Location: [place];  Service: Gastroenterology;;   BILIARY STENT PLACEMENT  09/12/2018   Procedure: BILIARY STENT PLACEMENT;  Surgeon: Irving Copas., MD;  Location: [place];  Service: Gastroenterology;;   BILIARY STENT PLACEMENT  11/28/2018   Procedure: BILIARY STENT PLACEMENT;  Surgeon: Irving Copas., MD;  Location:  [facility] ENDOSCOPY;  Service: Gastroenterology;;   BIOPSY  04/01/2018   Procedure: BIOPSY;  Surgeon: Irving Copas., MD;  Location: [place];  Service: Gastroenterology;;   BIOPSY  09/12/2018   Procedure: BIOPSY;  Surgeon: Irving Copas., MD;  Location: [place];  Service: Gastroenterology;;   BIOPSY  11/28/2018   Procedure:  BIOPSY;  Surgeon: Irving Copas., MD;  Location: [place];  Service: Gastroenterology;;   BIOPSY  03/11/2020   Procedure: BIOPSY;  Surgeon: Irving Copas., MD;  Location: [facility] ENDOSCOPY;  Service: Gastroenterology;;   bladder cancer      x 8 cystoscopy   CERVICAL DISCECTOMY     ACDF   COLONOSCOPY  11/17/2005   normal    CORONARY ARTERY BYPASS GRAFT     x4   CORONARY STENT INTERVENTION N/A 01/05/2019   Procedure: CORONARY STENT INTERVENTION;  Surgeon: Nelva Bush, MD;  Location: [place] [facility];  Service: Cardiovascular;  Laterality: N/A;   ENDOSCOPIC MUCOSAL RESECTION  09/12/2018   Procedure: ENDOSCOPIC MUCOSAL RESECTION;  Surgeon: Rush Landmark, Telford Nab., MD;  Location: [facility] ENDOSCOPY;  Service: Gastroenterology;;   ENDOSCOPIC RETROGRADE CHOLANGIOPANCREATOGRAPHY (ERCP) WITH PROPOFOL N/A 04/01/2018   Procedure: ENDOSCOPIC RETROGRADE CHOLANGIOPANCREATOGRAPHY (ERCP) WITH PROPOFOL;  Surgeon: Irving Copas., MD;  Location: [place];  Service: Gastroenterology;  Laterality: N/A;   ENDOSCOPIC RETROGRADE CHOLANGIOPANCREATOGRAPHY (ERCP) WITH PROPOFOL N/A 09/12/2018   Procedure: ENDOSCOPIC RETROGRADE CHOLANGIOPANCREATOGRAPHY (ERCP) WITH PROPOFOL;  Surgeon: Rush Landmark Telford Nab., MD;  Location: [place];  Service: Gastroenterology;  Laterality: N/A;   ENDOSCOPIC RETROGRADE CHOLANGIOPANCREATOGRAPHY (ERCP) WITH PROPOFOL N/A 03/11/2020   Procedure: ENDOSCOPIC RETROGRADE CHOLANGIOPANCREATOGRAPHY (ERCP) WITH PROPOFOL;  Surgeon: Rush Landmark Telford Nab., MD;  Location: [facility] ENDOSCOPY;  Service: Gastroenterology;  Laterality: N/A;   ENDOSCOPIC RETROGRADE CHOLANGIOPANCREATOGRAPHY (ERCP) WITH PROPOFOL N/A 06/03/2020   Procedure: ENDOSCOPIC RETROGRADE CHOLANGIOPANCREATOGRAPHY (ERCP) WITH PROPOFOL;  Surgeon: Rush Landmark Telford Nab., MD;  Location: [facility] ENDOSCOPY;  Service: Gastroenterology;  Laterality: N/A;   ERCP N/A 11/28/2018   Procedure: ENDOSCOPIC RETROGRADE  CHOLANGIOPANCREATOGRAPHY (ERCP) +EGD with spyglass;  Surgeon: Rush Landmark Telford Nab., MD;  Location: [place];  Service: Gastroenterology;  Laterality: N/A;   ESOPHAGOGASTRODUODENOSCOPY  04/29/2010   ESOPHAGOGASTRODUODENOSCOPY (EGD) WITH PROPOFOL N/A 04/01/2018   Procedure: ESOPHAGOGASTRODUODENOSCOPY (EGD) WITH PROPOFOL;  Surgeon: Rush Landmark Telford Nab., MD;  Location: [place];  Service: Gastroenterology;  Laterality: N/A;   ESOPHAGOGASTRODUODENOSCOPY (EGD) WITH PROPOFOL N/A 09/12/2018   Procedure: ESOPHAGOGASTRODUODENOSCOPY (EGD) WITH PROPOFOL;  Surgeon: Rush Landmark Telford Nab., MD;  Location: [place];  Service: Gastroenterology;  Laterality: N/A;   ESOPHAGOGASTRODUODENOSCOPY (EGD) WITH PROPOFOL N/A 11/28/2018   Procedure: ESOPHAGOGASTRODUODENOSCOPY (EGD) WITH PROPOFOL;  Surgeon: Rush Landmark Telford Nab., MD;  Location: [place];  Service: Gastroenterology;  Laterality: N/A;   ESOPHAGOGASTRODUODENOSCOPY (EGD) WITH PROPOFOL N/A 03/11/2020   Procedure: ESOPHAGOGASTRODUODENOSCOPY (EGD) WITH PROPOFOL;  Surgeon: Rush Landmark Telford Nab., MD;  Location: [facility] ENDOSCOPY;  Service: Gastroenterology;  Laterality: N/A;   ESOPHAGOGASTRODUODENOSCOPY (EGD) WITH PROPOFOL N/A 06/03/2020   Procedure: ESOPHAGOGASTRODUODENOSCOPY (EGD) WITH PROPOFOL;  Surgeon: Rush Landmark Telford Nab., MD;  Location: [facility] ENDOS[facility];  Service: Gastroenterology;  Laterality: N/A;   EUS  04/01/2018   Procedure: FULL UPPER ENDOSCOPIC ULTRASOUND (EUS) RADIAL;  Surgeon: Irving Copas., MD;  Location: [place];  Service: Gastroenterology;;   EUS N/A 09/12/2018   Procedure: UPPER ENDOSCOPIC ULTRASOUND (EUS) RADIAL;  Surgeon: Irving Copas., MD;  Location: [place];  Service: Gastroenterology;  Laterality: N/A;   FINE NEEDLE ASPIRATION  09/12/2018   Procedure: FINE NEEDLE ASPIRATION (FNA) LINEAR;  Surgeon: Irving Copas., MD;  Location: [place] ENDOSCOPY;  Service: Gastroenterology;;   HAND SURGERY Left 2018    saw accident   HEMOSTASIS CLIP PLACEMENT  09/12/2018   Procedure: HEMOSTASIS CLIP PLACEMENT;  Surgeon: Irving Copas., MD;  Location: [place];  Service: Gastroenterology;;   HEMOSTASIS CLIP PLACEMENT  06/03/2020   Procedure: HEMOSTASIS CLIP PLACEMENT;  Surgeon: Irving Copas., MD;  Location: [facility]OS[facility];  Service: Gastroenterology;;   I & D EXTREMITY Left 11/24/2016   Procedure: IRRIGATION AND DEBRIDEMENT LEFT HAND, THUMB, INDEX, MIDDLE, RING, AND SMALL FINGERS WITH RECONSTRUCTION;  Surgeon: Roseanne Kaufman, MD;  Location: [place];  Service: Orthopedics;  Laterality: Left;   KNEE ARTHROSCOPY Left    LEFT HEART CATH AND CORS/GRAFTS ANGIOGRAPHY N/A 01/05/2019   Procedure: LEFT HEART CATH AND CORS/GRAFTS ANGIOGRAPHY;  Surgeon: Nelva Bush, MD;  Location: [place] [facility];  Service: Cardiovascular;  Laterality: N/A;   LUMBAR LAMINECTOMY     and fusion x 2   NASAL SINUS SURGERY     POPLITEAL SYNOVIAL CYST EXCISION     REMOVAL OF STONES  04/01/2018   Procedure: REMOVAL OF STONES;  Surgeon: Rush Landmark Telford Nab., MD;  Location: [place];  Service: Gastroenterology;;   REMOVAL OF STONES  09/12/2018   Procedure: REMOVAL OF STONES;  Surgeon: Irving Copas., MD;  Location: [place];  Service: Gastroenterology;;   REMOVAL OF STONES  11/28/2018   Procedure: REMOVAL OF STONES;  Surgeon: Irving Copas., MD;  Location: [place];  Service: Gastroenterology;;   REMOVAL OF STONES  03/11/2020   Procedure: REMOVAL OF STONES;  Surgeon: Irving Copas., MD;  Location: [facility];  Service: Gastroenterology;;   REMOVAL OF STONES  06/03/2020   Procedure: REMOVAL OF STONES;  Surgeon: Irving Copas., MD;  Location: [facility];  Service: Gastroenterology;;   Azzie Almas DILATION N/A 09/12/2018   Procedure: Renato Battles;  Surgeon: Irving Copas., MD;  Location: [place];  Service: Gastroenterology;  Laterality: N/A;   SAVORY DILATION  N/A 11/28/2018   Procedure: SAVORY DILATION;  Surgeon: Rush Landmark Telford Nab., MD;  Location: [place];  Service: Gastroenterology;  Laterality: N/A;   SPHINCTEROTOMY  04/01/2018   Procedure: SPHINCTEROTOMY;  Surgeon: Mansouraty, Telford Nab., MD;  Location: [place];  Service: Gastroenterology;;   Bess Kinds CHOLANGIOSCOPY N/A 11/28/2018   Procedure: JSHFWYOV CHOLANGIOSCOPY;  Surgeon: Irving Copas., MD;  Location: [place];  Service: Gastroenterology;  Laterality: N/A;   SPYGLASS CHOLANGIOSCOPY N/A 06/03/2020   Procedure: SPYGLASS CHOLANGIOSCOPY;  Surgeon: Irving Copas., MD;  Location: [facility];  Service: Gastroenterology;  Laterality: N/A;   STENT REMOVAL  09/12/2018   Procedure: STENT REMOVAL;  Surgeon: Irving Copas., MD;  Location: [place];  Service: Gastroenterology;;   Lavell Islam REMOVAL  11/28/2018   Procedure: STENT REMOVAL;  Surgeon: Irving Copas., MD;  Location: [place];  Service: Gastroenterology;;   Lavell Islam REMOVAL  03/11/2020   Procedure: STENT REMOVAL;  Surgeon: Irving Copas., MD;  Location: [facility];  Service: Gastroenterology;;   SUBMUCOSAL LIFTING INJECTION  09/12/2018   Procedure: SUBMUCOSAL LIFTING INJECTION;  Surgeon: Irving Copas., MD;  Location: [place];  Service: Gastroenterology;;    Social History:  reports that he quit smoking about 44 years ago. His smoking use included cigarettes. He has a 2.50 pack-year smoking history. He has never used smokeless tobacco. He reports that he does not drink alcohol and does not use drugs.  Allergies: Allergies  Allergen Reactions   Losartan Potassium Other (See Comments)    Hyperkalemia  Tape Itching and Rash    reddened skin     Family History:  Family History  Problem Relation Age of Onset   Melanoma Mother    Stroke Father    Hypertension Father    Coronary artery disease Other    Colon cancer Neg Hx    Esophageal cancer Neg Hx     Stomach cancer Neg Hx    Rectal cancer Neg Hx    Pancreatic cancer Neg Hx    Liver disease Neg Hx    Inflammatory bowel disease Neg Hx      Current Outpatient Medications:    acetaminophen (TYLENOL) 500 MG tablet, Take 1,000 mg by mouth every 6 (six) hours as needed for moderate pain., Disp: , Rfl:    amLODipine (NORVASC) 2.5 MG tablet, Take 2 tablets by mouth once daily, Disp: 90 tablet, Rfl: 0   aspirin EC 81 MG tablet, Take 81 mg by mouth every evening., Disp: , Rfl:    cetirizine (ZYRTEC) 10 MG tablet, Take 10 mg by mouth daily as needed for allergies., Disp: , Rfl:    clopidogrel (PLAVIX) 75 MG tablet, Take 1 tablet (75 mg total) by mouth daily., Disp: 90 tablet, Rfl: 3   Coenzyme Q10 300 MG CAPS, Take 300 mg by mouth every evening., Disp: , Rfl:    cyanocobalamin (,VITAMIN B-12,) 1000 MCG/ML injection, Inject 1 mL (1,000 mcg total) into the skin every 30 (thirty) days., Disp: 6 mL, Rfl: 1   ezetimibe (ZETIA) 10 MG tablet, Take 1 tablet by mouth once daily, Disp: 90 tablet, Rfl: 0   Ferrous Sulfate (IRON) 28 MG TABS, Take 28 mg by mouth daily., Disp: , Rfl:    fluticasone (FLONASE) 50 MCG/ACT nasal spray, Place 1-2 sprays into both nostrils at bedtime as needed for allergies or rhinitis., Disp: , Rfl:    furosemide (LASIX) 40 MG tablet, Take 1 tablet (40 mg total) by mouth every other day. (Patient taking differently: Take 40 mg by mouth daily as needed for fluid or edema.), Disp: 90 tablet, Rfl: 1   guaifenesin (HUMIBID E) 400 MG TABS tablet, Take 400 mg by mouth daily., Disp: , Rfl:    hyoscyamine (LEVSIN SL) 0.125 MG SL tablet, Place 1 tablet (0.125 mg total) under the tongue every 4 (four) hours as needed. (Patient taking differently: Place 0.125 mg under the tongue every 4 (four) hours as needed (spasms).), Disp: 30 tablet, Rfl: 2   isosorbide mononitrate (IMDUR) 60 MG 24 hr tablet, Take 1 tablet by mouth once daily (Patient taking differently: Take 60 mg by mouth daily.), Disp: 90  tablet, Rfl: 3   Menthol, Topical Analgesic, (BENGAY EX), Apply 1 application topically daily as needed (pain)., Disp: , Rfl:    METAMUCIL FIBER PO, Take 3 capsules by mouth daily., Disp: , Rfl:    Multiple Vitamins-Minerals (MULTIVITAMIN WITH MINERALS) tablet, Take 1 tablet by mouth daily., Disp: , Rfl:    Naphazoline-Pheniramine (OPCON-A) 0.027-0.315 % SOLN, Place 1 drop into both eyes daily as needed (itching eyes)., Disp: , Rfl:    nebivolol (BYSTOLIC) 10 MG tablet, Take 1 tablet by mouth once daily, Disp: 90 tablet, Rfl: 1   NEEDLE, DISP, 25 G (B-D DISP NEEDLE 25GX1") 25G X 1" MISC, Inject 1000 mcg into muscle once a month., Disp: 50 each, Rfl: 0   nitroGLYCERIN (NITROSTAT) 0.4 MG SL tablet, PLACE 1 TABLET UNDER THE TONGUE EVERY 5 MINUTES AS NEEDED FOR CHEST PAIN, Disp: 25 tablet, Rfl: 2   pantoprazole (  PROTONIX) 40 MG tablet, Take 1 tablet (40 mg total) by mouth daily., Disp: 90 tablet, Rfl: 3   Probiotic Product (PROBIOTIC ADVANCED PO), Take 1 tablet by mouth every evening., Disp: , Rfl:    rOPINIRole (REQUIP) 2 MG tablet, TAKE 1 TABLET BY MOUTH AT BEDTIME, Disp: 90 tablet, Rfl: 0   rosuvastatin (CRESTOR) 10 MG tablet, Take 1 tablet by mouth once daily, Disp: 90 tablet, Rfl: 1   venlafaxine (EFFEXOR) 75 MG tablet, Take 1 tablet by mouth once daily, Disp: 90 tablet, Rfl: 1   oxyCODONE-acetaminophen (PERCOCET) 10-325 MG tablet, Take 1 tablet by mouth 2 (two) times daily., Disp: 60 tablet, Rfl: 0   oxyCODONE-acetaminophen (PERCOCET) 10-325 MG tablet, Take 1 tablet by mouth 2 (two) times daily as needed for pain., Disp: 60 tablet, Rfl: 0   oxyCODONE-acetaminophen (PERCOCET) 10-325 MG tablet, Take 1 tablet by mouth 2 (two) times daily as needed for pain., Disp: 60 tablet, Rfl: 0  Review of Systems:  Constitutional: Denies fever, chills, diaphoresis, appetite change and fatigue.  HEENT: Denies photophobia, eye pain, redness, hearing loss, ear pain, congestion, sore throat, rhinorrhea, sneezing,  mouth sores, trouble swallowing, neck pain, neck stiffness and tinnitus.   Respiratory: Denies SOB, DOE, cough, chest tightness,  and wheezing.   Cardiovascular: Denies chest pain, palpitations and leg swelling.  Gastrointestinal: Denies nausea, vomiting, abdominal pain, diarrhea, constipation, blood in stool and abdominal distention.  Genitourinary: Denies dysuria, urgency, frequency, hematuria, flank pain and difficulty urinating.  Endocrine: Denies: hot or cold intolerance, sweats, changes in hair or nails, polyuria, polydipsia. Musculoskeletal: Positive for myalgias, back pain, joint swelling, arthralgias and gait problem.  Skin: Denies pallor, rash and wound.  Neurological: Denies dizziness, seizures, syncope, weakness, light-headedness, numbness and headaches.  Hematological: Denies adenopathy. Easy bruising, personal or family bleeding history  Psychiatric/Behavioral: Denies suicidal ideation, mood changes, confusion, nervousness, sleep disturbance and agitation    Physical Exam: Vitals:   12/03/20 0949  BP: 110/68  Pulse: 61  Temp: 98 F (36.7 C)  TempSrc: Oral  SpO2: 94%  Weight: 178 lb 11.2 oz (81.1 kg)    Body mass index is 26.39 kg/m.   Constitutional: NAD, calm, comfortable Eyes: PERRL, lids and conjunctivae normal ENMT: Mucous membranes are moist.  Respiratory: clear to auscultation bilaterally, no wheezing, no crackles. Normal respiratory effort. No accessory muscle use.  Cardiovascular: Regular rate and rhythm, no murmurs / rubs / gallops. No extremity edema.  Neurologic: Grossly intact and nonfocal Psychiatric: Normal judgment and insight. Alert and oriented x 3. Normal mood.    Impression and Plan   Hyperlipidemia, unspecified hyperlipidemia type -Recent lipid panel with total cholesterol 113, triglycerides 145, LDL 56.  Type 2 diabetes mellitus with other specified complication, without long-term current use of insulin (HCC) -Well-controlled with an  A1c of 6.1 today.  Chronic pain syndrome -PDMP reviewed, no red flags, overdose risk score is 50. -Refill oxycodone 5/325 mg to take half a tablet 3 times a day for a total of 60 tablets a month x3 months.   Time spent: 31 minutes reviewing chart, interviewing and examining patient and formulating plan of care.    [name], MD [facility]

## 2020-12-04 DIAGNOSIS — D1801 Hemangioma of skin and subcutaneous tissue: Secondary | ICD-10-CM | POA: Diagnosis not present

## 2020-12-04 DIAGNOSIS — L570 Actinic keratosis: Secondary | ICD-10-CM | POA: Diagnosis not present

## 2020-12-04 DIAGNOSIS — C44311 Basal cell carcinoma of skin of nose: Secondary | ICD-10-CM | POA: Diagnosis not present

## 2020-12-04 DIAGNOSIS — L565 Disseminated superficial actinic porokeratosis (DSAP): Secondary | ICD-10-CM | POA: Diagnosis not present

## 2020-12-04 DIAGNOSIS — L814 Other melanin hyperpigmentation: Secondary | ICD-10-CM | POA: Diagnosis not present

## 2020-12-04 DIAGNOSIS — C44319 Basal cell carcinoma of skin of other parts of face: Secondary | ICD-10-CM | POA: Diagnosis not present

## 2020-12-04 DIAGNOSIS — L812 Freckles: Secondary | ICD-10-CM | POA: Diagnosis not present

## 2020-12-04 DIAGNOSIS — L820 Inflamed seborrheic keratosis: Secondary | ICD-10-CM | POA: Diagnosis not present

## 2020-12-04 DIAGNOSIS — L821 Other seborrheic keratosis: Secondary | ICD-10-CM | POA: Diagnosis not present

## 2020-12-04 DIAGNOSIS — Z85828 Personal history of other malignant neoplasm of skin: Secondary | ICD-10-CM | POA: Diagnosis not present

## 2020-12-04 NOTE — Telephone Encounter (Signed)
Contacted patient as part of the preoperative protocol.  I spoke with his daughter who reported that he has an appointment with Dr. Claiborne Billings 12/05/2020.  She also reported that he is taking turmeric.  She is concerned that the patient is not aware of the blood thinning effects.  She asked that we send a note along to Dr. Claiborne Billings so that he may discuss the patient's aspirin Plavix and that there is no need for turmeric supplementation.  I will defer cardiac clearance to Dr. Claiborne Billings during the patient's appointment tomorrow.  I will remove him from the preoperative cardiac pool.  Jossie Ng. Aariyana Manz NP-C    12/04/2020, 9:44 AM [address] Office [phone] Fax [phone]

## 2020-12-05 ENCOUNTER — Other Ambulatory Visit: Payer: Self-pay

## 2020-12-05 ENCOUNTER — Encounter: Payer: Self-pay | Admitting: Cardiovascular Disease

## 2020-12-05 ENCOUNTER — Ambulatory Visit: Payer: Medicare Other | Admitting: Cardiovascular Disease

## 2020-12-05 DIAGNOSIS — Z79899 Other long term (current) drug therapy: Secondary | ICD-10-CM

## 2020-12-05 DIAGNOSIS — I25810 Atherosclerosis of coronary artery bypass graft(s) without angina pectoris: Secondary | ICD-10-CM

## 2020-12-05 DIAGNOSIS — E785 Hyperlipidemia, unspecified: Secondary | ICD-10-CM

## 2020-12-05 DIAGNOSIS — I5032 Chronic diastolic (congestive) heart failure: Secondary | ICD-10-CM

## 2020-12-05 DIAGNOSIS — Z951 Presence of aortocoronary bypass graft: Secondary | ICD-10-CM | POA: Diagnosis not present

## 2020-12-05 DIAGNOSIS — I10 Essential (primary) hypertension: Secondary | ICD-10-CM

## 2020-12-05 NOTE — Progress Notes (Signed)
Patient ID: [name], male   DOB: May 28, 1940, 80 y.o.   MRN: [id number]      HPI: [name] is a 80 y.o. male who presents to the office today for an 8 month cardiology followup evaluation.  Mr. Corbit has known CAD and underwent CABG revascularization surgery in 2001 with a LIMA to his LAD, vein to the marginal, vein to the RCA. Catheterization in 2004 showed patent grafts. An echo Doppler study in August 2012 revealed an ejection fraction of 40-45% with moderate septal hypokinesis with grade 1 diastolic dysfunction as well as aortic valve sclerosis. Additional problems include GERD, mild emphysema, as well as obstructive sleep apnea. He no longer utilizes CPAP. He has lost approximately 25 pounds since his wife's death from a motor vehicle accident in January 2013.  He remains active and notes mild shortness of breath with activity particularly when walking up hills but this has not changed.  He also is a history of restless leg syndrome and has benefited with requip. Laboratory in 2013 demonstrated LDL 57 with LDL particle #1035, small LDL particle number was 611, slightly increased, HDLC was 35 triglycerides 93 total cholesterol 111 initial resistance course slightly elevated at 53.  BP blood work in March 2015 revealed normal renal function.  Fasting glucose was minimally increased at 106.  Total cholesterol was 116 triglycerides 80, and LDL 66, with an HDL of 34.  LDL particle number was slightly increased at 1190.  He undewent a five-year follow-up nuclear perfusion study in November 2016.  Ejection fraction was 54%.  He had normal perfusion.  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  He admits to arthralgias without myalgias.  He has issues with his in his legs and his peripheral neuropathy.  I further titrated his losartan to 75 mg and Bystolic to 10 mg.  He believes his blood pressure and pulse have been better.   He admits to leg discomfort which seems worse with atorvastatin.  When I last saw him, he had noticed some shortness of breath with uphill walking and with exertion but denied recurrent episodes of chest tightness.   He denied PND, orthopnea.  He underwent a follow-up echo Doppler study on 10/28/2016.  Ejection fraction was excellent at 60-65%.  There was grade 2 diastolic dysfunction.  There was evidence for aortic sclerosis without stenosis.  There was mild LA dilation.  He had trivial PR.    I saw him in October 2018. He subsequently  underwent left hand surgery.  His primary care with [name] is retired.  When I last saw him in November 2019 recently, he  noticed progressive increase in shortness of breath and this seems to develop even trying to walk up very small hill.  He denied any definitive chest tightness.  He has started to run out of some of his chronic pain meds which had been prescribed by [name].  He has not yet reestablished with another primary physician.  He was on Bystolic 10 mg and isosorbide 60 mg for his CAD and hypertension.  He was on Zetia 10 mg and rosuvastatin for hyperlipidemia.  He continues to be on chronic aspirin therapy.  He has restless legs and is on Requip.  His previous primary physician had prescribed Effexor in addition to Percocet.  In November 2019, with his change in symptomatology I recommended a follow-up echo Doppler study and a lexiscan  Myoview.  As he had had some issues with elevated potassium on ARB therapy and I started him on HCTZ other than spironolactone.  December 23, 2017 an echo Doppler study showed an EF of 60 to 65% with grade 1 diastolic dysfunction and mild aortic valve stenosis.  A Lexiscan Myoview study on January 04, 2018 was low risk and demonstrated normal perfusion without scar or ischemia.  EF was 51%.  He was hospitalized from March 11 through April 06, 2019 presenting with abdominal discomfort,.  Laboratory revealed  leukocytosis to 21,400, elevated lactic acid of 2.4, elevated lipase at 390 urinalysis notable for proteinuria and a creatinine had risen to 1.59 and there was mild elevation of AST and total bilirubin.  He was ultimately had positive blood cultures for coli to have sepsis and colitis.  An MRCP showed acute pancreatitis, mild gallbladder distention with peri-cholecystic fluid and gallbladder wall thickening.  School he underwent ERCP on April 01, 2018 which showed stricture and sludge without stone he underwent sphincterotomy and stent placement.  He is planned to be on antibiotics for several weeks.  During his hospitalization he developed acute kidney injury with peak creatinine at 2.27.  His medications were adjusted and on discharge creatinine was 1.75.    He was  evaluated by me in a telemedicine evaluation on May 19, 2018.  From a cardiac perspective he felt improved.  He was not having any leg swelling.  He denied any chest pain but continued to experience shortness of breath symptoms.  Creatinine had improved to 1.46.  He continues to have issues with back and leg discomfort and remotely had undergone steroid injections with [name].  I saw him in evaluation September 2020 at which time he continued  to experience his chronic shortness of breath but that has not increased in severity.  He denies any chest pain.  He has had some issues with swallowing and continues to see GI physician.  He has undergone esophageal dilatation.  He is unaware of any rhythm disturbance.  He has been evaluated by [name] and he was referred for a lower extremity Doppler study which was negative for DVT.  He had been noted to have a left pleural effusion which is followed by 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   He eventually underwent robotic cholecystectomy by Dr. Windle Guard on 01/02/2019.   He has biliary stents in place for distal biliary stricture.  After his procedure, he developed a bilateral  pneumonia and shortness of breath with increasing troponin.  CT of the chest was negative for PE.  Patient was treated with IV Lasix and antibiotic. Troponin was elevated and he subsequently underwent cardiac catheterization on 01/05/2019 by Dr. Saunders Revel which showed 99% subtotally occluded SVG-RPDA/RPL.  This was treated with 3 overlapping resolute Onyx drug-eluting stents.  Postprocedure, it was recommended to continue aspirin and Plavix indefinitely and if the patient subsequently developed  refractory symptoms there was consideration for PCI of ostial left main, left circumflex and OM1.  Echocardiogram obtained on the same day showed EF 40 to 45%, severe hypokinesis of the entire inferior and inferoseptal wall, grade 1 DD, mild MR, cannot exclude ASD/PFO.   He was seen in follow-up by Almyra Deforest on January 19, 2019.  He continued to have fatigue however gradually improving.  He previously had a epigastric pain which he attributed to acid reflux which retrospectively may have been his angina since there was complete resolution of this discomfort following his hospitalization.  He was walking at home multiple times throughout the day without any issue.     He was seen in a telemedicine evaluation in March 2021 and over the several months prior to that evaluation he denied any recurrent chest pain episodes.  However he admitted to feeling "rough."  He was getting physical therapy times per week.  He has been staying at home all the time.  His heart rate has been regular.  He is not aware of any palpitations.  He had not been using CPAP.   When I saw him in June 2021 he complained that he "hurts all over."  He states he has aches in his legs, back and shoulders.  Oftentimes he notices his legs hurt worse before he has to go to the bathroom.  He denied any anginal symptoms and was unaware of any cardiac arrhythmia.  He denied presyncope or syncope.    He was evaluated by Coletta Memos in October 2021 and most  recently January 2022.  He had been started on Lasix for leg swelling in October 2021 and continued to be on Bystolic, Imdur, aspirin/Plavix and statin.    I last saw him on March 22, 2020 at which time he felt well.  He had had purposeful weight loss.  He has a stent in the liver a due to his prior a sending cholangitis and pancreatitis due to biliary strictures.  He is scheduled to undergo a CT of his abdomen at the end of March for further evaluation.  He denies any chest pain or anginal symptomatology.  Breathing is better.  He admits to right shoulder discomfort.  During that evaluation, his blood pressure was stable amlodipine 5 mg, isosorbide 60 mg, and Bystolic 10 mg.  Since I last saw him, he feels well from a cardiac standpoint.  However, he continues to have pain in his shoulder particularly in raising his arm.  At times he developed some nausea following eating.  He will need dental extraction of approximately 10 teeth.  His blood pressure has been stable at home.  He presents for reevaluation.  Past Medical History:  Diagnosis Date   Allergy    B12 deficiency anemia    Blood transfusion without reported diagnosis    CAD (coronary artery disease)    Cancer (HCC)    bladder-    Colon polyps    COPD (chronic obstructive pulmonary disease) (HCC)    Depression    Esophagus, Barrett's    GERD (gastroesophageal reflux disease)    History of bladder cancer    Bladder cancer "8 times"   History of hiatal hernia    Hyperlipidemia    Hypertension    Localized osteoarthrosis, lower leg    Myocardial infarction ([place]) 2021   Restless leg syndrome    Sleep apnea    does not wear cpap   Stenosis of esophagus     Past Surgical History:  Procedure Laterality Date   BILIARY BRUSHING  04/01/2018   Procedure: BILIARY BRUSHING;  Surgeon: Irving Copas., MD;  Location: [facility] ENDOSCOPY;  Service: Gastroenterology;;   BILIARY BRUSHING  09/12/2018   Procedure: BILIARY BRUSHING;  Surgeon:  Irving Copas., MD;  Location: [facility] ENDOSCOPY;  Service: Gastroenterology;;   BILIARY BRUSHING  11/28/2018   Procedure: BILIARY BRUSHING;  Surgeon: Irving Copas., MD;  Location: [place];  Service: Gastroenterology;;   BILIARY BRUSHING  03/11/2020   Procedure: BILIARY BRUSHING;  Surgeon: Irving Copas., MD;  Location: [facility] ENDOSCOPY;  Service: Gastroenterology;;   BILIARY DILATION  09/12/2018   Procedure: BILIARY DILATION;  Surgeon: Rush Landmark Telford Nab., MD;  Location: [place];  Service: Gastroenterology;;   BILIARY DILATION  11/28/2018   Procedure: BILIARY DILATION;  Surgeon: Irving Copas., MD;  Location: [place];  Service: Gastroenterology;;   BILIARY DILATION  03/11/2020   Procedure: BILIARY DILATION;  Surgeon: Irving Copas., MD;  Location: [facility];  Service: Gastroenterology;;   BILIARY STENT PLACEMENT  04/01/2018   Procedure: BILIARY STENT PLACEMENT;  Surgeon: Irving Copas., MD;  Location: [place];  Service: Gastroenterology;;   BILIARY STENT PLACEMENT  09/12/2018   Procedure: BILIARY STENT PLACEMENT;  Surgeon: Irving Copas., MD;  Location: [place];  Service: Gastroenterology;;   BILIARY STENT PLACEMENT  11/28/2018   Procedure: BILIARY STENT PLACEMENT;  Surgeon: Irving Copas., MD;  Location: [place];  Service: Gastroenterology;;   BIOPSY  04/01/2018   Procedure: BIOPSY;  Surgeon: Irving Copas., MD;  Location: [place];  Service: Gastroenterology;;   BIOPSY  09/12/2018   Procedure: BIOPSY;  Surgeon: Irving Copas., MD;  Location: [place];  Service: Gastroenterology;;   BIOPSY  11/28/2018   Procedure: BIOPSY;  Surgeon: Irving Copas., MD;  Location: [place];  Service: Gastroenterology;;   BIOPSY  03/11/2020   Procedure: BIOPSY;  Surgeon: Irving Copas., MD;  Location: [facility] ENDOSCOPY;  Service: Gastroenterology;;   bladder cancer      x 8  cystoscopy   CERVICAL DISCECTOMY     ACDF   COLONOSCOPY  11/17/2005   normal    CORONARY ARTERY BYPASS GRAFT     x4   CORONARY STENT INTERVENTION N/A 01/05/2019   Procedure: CORONARY STENT INTERVENTION;  Surgeon: Nelva Bush, MD;  Location: [place] [facility];  Service: Cardiovascular;  Laterality: N/A;   ENDOSCOPIC MUCOSAL RESECTION  09/12/2018   Procedure: ENDOSCOPIC MUCOSAL RESECTION;  Surgeon: Rush Landmark, Telford Nab., MD;  Location: [facility] ENDOSCOPY;  Service: Gastroenterology;;   ENDOSCOPIC RETROGRADE CHOLANGIOPANCREATOGRAPHY (ERCP) WITH PROPOFOL N/A 04/01/2018   Procedure: ENDOSCOPIC RETROGRADE CHOLANGIOPANCREATOGRAPHY (ERCP) WITH PROPOFOL;  Surgeon: Irving Copas., MD;  Location: [place];  Service: Gastroenterology;  Laterality: N/A;   ENDOSCOPIC RETROGRADE CHOLANGIOPANCREATOGRAPHY (ERCP) WITH PROPOFOL N/A 09/12/2018   Procedure: ENDOSCOPIC RETROGRADE CHOLANGIOPANCREATOGRAPHY (ERCP) WITH PROPOFOL;  Surgeon: Rush Landmark Telford Nab., MD;  Location: [place];  Service: Gastroenterology;  Laterality: N/A;   ENDOSCOPIC RETROGRADE CHOLANGIOPANCREATOGRAPHY (ERCP) WITH PROPOFOL N/A 03/11/2020   Procedure: ENDOSCOPIC RETROGRADE CHOLANGIOPANCREATOGRAPHY (ERCP) WITH PROPOFOL;  Surgeon: Rush Landmark Telford Nab., MD;  Location: [facility]OSCOPY;  Service: Gastroenterology;  Laterality: N/A;   ENDOSCOPIC RETROGRADE CHOLANGIOPANCREATOGRAPHY (ERCP) WITH PROPOFOL N/A 06/03/2020   Procedure: ENDOSCOPIC RETROGRADE CHOLANGIOPANCREATOGRAPHY (ERCP) WITH PROPOFOL;  Surgeon: Rush Landmark Telford Nab., MD;  Location: [facility]OS[facility];  Service: Gastroenterology;  Laterality: N/A;   ERCP N/A 11/28/2018   Procedure: ENDOSCOPIC RETROGRADE CHOLANGIOPANCREATOGRAPHY (ERCP) +EGD with spyglass;  Surgeon: Rush Landmark Telford Nab., MD;  Location: [place];  Service: Gastroenterology;  Laterality: N/A;   ESOPHAGOGASTRODUODENOSCOPY  04/29/2010   ESOPHAGOGASTRODUODENOSCOPY (EGD) WITH PROPOFOL N/A 04/01/2018   Procedure:  ESOPHAGOGASTRODUODENOSCOPY (EGD) WITH PROPOFOL;  Surgeon: Rush Landmark Telford Nab., MD;  Location: [place];  Service: Gastroenterology;  Laterality: N/A;   ESOPHAGOGASTRODUODENOSCOPY (EGD) WITH PROPOFOL N/A 09/12/2018   Procedure: ESOPHAGOGASTRODUODENOSCOPY (EGD) WITH PROPOFOL;  Surgeon: Rush Landmark Telford Nab., MD;  Location: [place];  Service: Gastroenterology;  Laterality: N/A;   ESOPHAGOGASTRODUODENOSCOPY (EGD) WITH PROPOFOL N/A 11/28/2018   Procedure: ESOPHAGOGASTRODUODENOSCOPY (EGD) WITH PROPOFOL;  Surgeon: Rush Landmark Telford Nab., MD;  Location:  [place] ENDOSCOPY;  Service: Gastroenterology;  Laterality: N/A;   ESOPHAGOGASTRODUODENOSCOPY (EGD) WITH PROPOFOL N/A 03/11/2020   Procedure: ESOPHAGOGASTRODUODENOSCOPY (EGD) WITH PROPOFOL;  Surgeon: Rush Landmark Telford Nab., MD;  Location: [facility] ENDOS[facility];  Service: Gastroenterology;  Laterality: N/A;   ESOPHAGOGASTRODUODENOSCOPY (EGD) WITH PROPOFOL N/A 06/03/2020   Procedure: ESOPHAGOGASTRODUODENOSCOPY (EGD) WITH PROPOFOL;  Surgeon: Rush Landmark Telford Nab., MD;  Location: [facility] ENDOS[facility];  Service: Gastroenterology;  Laterality: N/A;   EUS  04/01/2018   Procedure: FULL UPPER ENDOSCOPIC ULTRASOUND (EUS) RADIAL;  Surgeon: Irving Copas., MD;  Location: [place];  Service: Gastroenterology;;   EUS N/A 09/12/2018   Procedure: UPPER ENDOSCOPIC ULTRASOUND (EUS) RADIAL;  Surgeon: Irving Copas., MD;  Location: [place];  Service: Gastroenterology;  Laterality: N/A;   FINE NEEDLE ASPIRATION  09/12/2018   Procedure: FINE NEEDLE ASPIRATION (FNA) LINEAR;  Surgeon: Irving Copas., MD;  Location: [place];  Service: Gastroenterology;;   HAND SURGERY Left 2018   saw accident   HEMOSTASIS CLIP PLACEMENT  09/12/2018   Procedure: HEMOSTASIS CLIP PLACEMENT;  Surgeon: Irving Copas., MD;  Location: [place];  Service: Gastroenterology;;   HEMOSTASIS CLIP PLACEMENT  06/03/2020   Procedure: HEMOSTASIS CLIP PLACEMENT;  Surgeon:  Irving Copas., MD;  Location: [facility];  Service: Gastroenterology;;   I & D EXTREMITY Left 11/24/2016   Procedure: IRRIGATION AND DEBRIDEMENT LEFT HAND, THUMB, INDEX, MIDDLE, RING, AND SMALL FINGERS WITH RECONSTRUCTION;  Surgeon: Roseanne Kaufman, MD;  Location: [place];  Service: Orthopedics;  Laterality: Left;   KNEE ARTHROSCOPY Left    LEFT HEART CATH AND CORS/GRAFTS ANGIOGRAPHY N/A 01/05/2019   Procedure: LEFT HEART CATH AND CORS/GRAFTS ANGIOGRAPHY;  Surgeon: Nelva Bush, MD;  Location: [place] [facility];  Service: Cardiovascular;  Laterality: N/A;   LUMBAR LAMINECTOMY     and fusion x 2   NASAL SINUS SURGERY     POPLITEAL SYNOVIAL CYST EXCISION     REMOVAL OF STONES  04/01/2018   Procedure: REMOVAL OF STONES;  Surgeon: Rush Landmark Telford Nab., MD;  Location: [place];  Service: Gastroenterology;;   REMOVAL OF STONES  09/12/2018   Procedure: REMOVAL OF STONES;  Surgeon: Irving Copas., MD;  Location: [place];  Service: Gastroenterology;;   REMOVAL OF STONES  11/28/2018   Procedure: REMOVAL OF STONES;  Surgeon: Irving Copas., MD;  Location: [place];  Service: Gastroenterology;;   REMOVAL OF STONES  03/11/2020   Procedure: REMOVAL OF STONES;  Surgeon: Irving Copas., MD;  Location: [facility];  Service: Gastroenterology;;   REMOVAL OF STONES  06/03/2020   Procedure: REMOVAL OF STONES;  Surgeon: Irving Copas., MD;  Location: [facility];  Service: Gastroenterology;;   Azzie Almas DILATION N/A 09/12/2018   Procedure: Renato Battles;  Surgeon: Irving Copas., MD;  Location: [place];  Service: Gastroenterology;  Laterality: N/A;   SAVORY DILATION N/A 11/28/2018   Procedure: SAVORY DILATION;  Surgeon: Rush Landmark Telford Nab., MD;  Location: [place];  Service: Gastroenterology;  Laterality: N/A;   SPHINCTEROTOMY  04/01/2018   Procedure: SPHINCTEROTOMY;  Surgeon: Mansouraty, Telford Nab., MD;  Location: [place];   Service: Gastroenterology;;   Bess Kinds CHOLANGIOSCOPY N/A 11/28/2018   Procedure: ASNKNLZJ CHOLANGIOSCOPY;  Surgeon: Irving Copas., MD;  Location: [place];  Service: Gastroenterology;  Laterality: N/A;   SPYGLASS CHOLANGIOSCOPY N/A 06/03/2020   Procedure: SPYGLASS CHOLANGIOSCOPY;  Surgeon: Irving Copas., MD;  Location: [facility];  Service: Gastroenterology;  Laterality: N/A;   STENT REMOVAL  09/12/2018   Procedure: STENT REMOVAL;  Surgeon:  Mansouraty, Telford Nab., MD;  Location: [place];  Service: Gastroenterology;;   Lavell Islam REMOVAL  11/28/2018   Procedure: STENT REMOVAL;  Surgeon: Irving Copas., MD;  Location: [place];  Service: Gastroenterology;;   Lavell Islam REMOVAL  03/11/2020   Procedure: STENT REMOVAL;  Surgeon: Irving Copas., MD;  Location: [facility];  Service: Gastroenterology;;   Maryagnes Amos INJECTION  09/12/2018   Procedure: SUBMUCOSAL LIFTING INJECTION;  Surgeon: Irving Copas., MD;  Location: [place];  Service: Gastroenterology;;    Allergies  Allergen Reactions   Losartan Potassium Other (See Comments)    Hyperkalemia   Tape Itching and Rash    reddened skin     Current Outpatient Medications  Medication Sig Dispense Refill   acetaminophen (TYLENOL) 500 MG tablet Take 1,000 mg by mouth every 6 (six) hours as needed for moderate pain.     amLODipine (NORVASC) 2.5 MG tablet Take 2 tablets by mouth once daily 90 tablet 0   aspirin EC 81 MG tablet Take 81 mg by mouth every evening.     cetirizine (ZYRTEC) 10 MG tablet Take 10 mg by mouth daily as needed for allergies.     clopidogrel (PLAVIX) 75 MG tablet Take 1 tablet (75 mg total) by mouth daily. 90 tablet 3   Coenzyme Q10 300 MG CAPS Take 300 mg by mouth every evening.     cyanocobalamin (,VITAMIN B-12,) 1000 MCG/ML injection Inject 1 mL (1,000 mcg total) into the skin every 30 (thirty) days. 6 mL 1   ezetimibe (ZETIA) 10 MG tablet Take 1 tablet by  mouth once daily 90 tablet 0   Ferrous Sulfate (IRON) 28 MG TABS Take 28 mg by mouth daily.     fluticasone (FLONASE) 50 MCG/ACT nasal spray Place 1-2 sprays into both nostrils at bedtime as needed for allergies or rhinitis.     furosemide (LASIX) 40 MG tablet Take 1 tablet (40 mg total) by mouth every other day. (Patient taking differently: Take 40 mg by mouth daily as needed for fluid or edema.) 90 tablet 1   guaifenesin (HUMIBID E) 400 MG TABS tablet Take 400 mg by mouth daily.     hyoscyamine (LEVSIN SL) 0.125 MG SL tablet Place 1 tablet (0.125 mg total) under the tongue every 4 (four) hours as needed. (Patient taking differently: Place 0.125 mg under the tongue every 4 (four) hours as needed (spasms).) 30 tablet 2   isosorbide mononitrate (IMDUR) 60 MG 24 hr tablet Take 1 tablet by mouth once daily (Patient taking differently: Take 60 mg by mouth daily.) 90 tablet 3   Menthol, Topical Analgesic, (BENGAY EX) Apply 1 application topically daily as needed (pain).     METAMUCIL FIBER PO Take 3 capsules by mouth daily.     Multiple Vitamins-Minerals (MULTIVITAMIN WITH MINERALS) tablet Take 1 tablet by mouth daily.     Naphazoline-Pheniramine (OPCON-A) 0.027-0.315 % SOLN Place 1 drop into both eyes daily as needed (itching eyes).     nebivolol (BYSTOLIC) 10 MG tablet Take 1 tablet by mouth once daily 90 tablet 1   NEEDLE, DISP, 25 G (B-D DISP NEEDLE 25GX1") 25G X 1" MISC Inject 1000 mcg into muscle once a month. 50 each 0   nitroGLYCERIN (NITROSTAT) 0.4 MG SL tablet PLACE 1 TABLET UNDER THE TONGUE EVERY 5 MINUTES AS NEEDED FOR CHEST PAIN 25 tablet 2   oxyCODONE-acetaminophen (PERCOCET) 10-325 MG tablet Take 1 tablet by mouth 2 (two) times daily. 60 tablet 0   oxyCODONE-acetaminophen (PERCOCET) 10-325  MG tablet Take 1 tablet by mouth 2 (two) times daily as needed for pain. 60 tablet 0   oxyCODONE-acetaminophen (PERCOCET) 10-325 MG tablet Take 1 tablet by mouth 2 (two) times daily as needed for pain. 60  tablet 0   pantoprazole (PROTONIX) 40 MG tablet Take 1 tablet (40 mg total) by mouth daily. 90 tablet 3   Probiotic Product (PROBIOTIC ADVANCED PO) Take 1 tablet by mouth every evening.     rOPINIRole (REQUIP) 2 MG tablet TAKE 1 TABLET BY MOUTH AT BEDTIME 90 tablet 0   rosuvastatin (CRESTOR) 10 MG tablet Take 1 tablet by mouth once daily 90 tablet 1   venlafaxine (EFFEXOR) 75 MG tablet Take 1 tablet by mouth once daily 90 tablet 1   No current facility-administered medications for this visit.   Socially he is widowed in January 2013. He has 3 children 6 grandchildren. 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 There is no recent tobacco use.  no alcohol.  His daughter is the wife of my patient Mr. Theodosia Paling.  ROS General: Negative; No fevers, chills, or night sweats;  HEENT: Negative; No changes in vision or hearing, sinus congestion, difficulty swallowing Pulmonary: Negative; No cough, wheezing, shortness of breath, hemoptysis Cardiovascular: Negative; No chest pain, presyncope, syncope, palpatations.  Minimal shortness of breath with activity GI: GERD; recent pancreatitis and septic cholangitis GU: Recent acute kidney injury Musculoskeletal: Positive for multiple joint aches involving his legs, hips, back, and shoulder Hematologic/Oncology: Negative; no easy bruising, bleeding Endocrine: Negative; no heat/cold intolerance; no diabetes Neuro: Negative; no changes in balance, headaches Skin: Negative; No rashes or skin lesions Psychiatric: Negative; No behavioral problems, depression Sleep: He no longer uses CPAP with his 25 pound weight loss.  His restless legs improved with low-dose Requip; No snoring, daytime sleepiness, hypersomnolence, bruxism, hypnogognic hallucinations, no cataplexy Other comprehensive 14 point system review is negative.   PE BP (!) 142/68   Pulse (!) 59   Ht $R'5\' 9"'Kw$  (1.753 m)    Wt 179 lb 3.2 oz (81.3 kg)   SpO2 93%   BMI 26.46 kg/m    Repeat blood pressure by me was 124/64  Wt Readings from Last 3 Encounters:  12/05/20 179 lb 3.2 oz (81.3 kg)  12/03/20 178 lb 11.2 oz (81.1 kg)  08/29/20 172 lb 8 oz (78.2 kg)   General: Alert, oriented, no distress.  Skin: normal turgor, no rashes, warm and dry HEENT: Normocephalic, atraumatic. Pupils equal round and reactive to light; sclera anicteric; extraocular muscles intact; Nose without nasal septal hypertrophy Mouth/Parynx benign; Mallinpatti scale 3 Neck: No JVD, no carotid bruits; normal carotid upstroke Lungs: clear to ausculatation and percussion; no wheezing or rales Chest wall: without tenderness to palpitation Heart: PMI not displaced, RRR, s1 s2 normal, 1/6 systolic murmur, no diastolic murmur, no rubs, gallops, thrills, or heaves Abdomen: soft, nontender; no hepatosplenomehaly, BS+; abdominal aorta nontender and not dilated by palpation. Back: no CVA tenderness Pulses 2+ Musculoskeletal: full range of motion, normal strength, no joint deformities Extremities: no clubbing cyanosis or edema, Homan's sign negative  Neurologic: grossly nonfocal; Cranial nerves grossly wnl Psychologic: Normal mood and affect   December 05, 2020 ECG (independently read by me): Sinus bradycardia at 59, no ectopy   March 22, 2020 ECG (independently read by me): Sinus bradycardia at 59; no ectopy, normal intervals  June 2021 ECG (independently read by me): Sinus bradycardia  58 bpm.  Q wave in lead III.  QTc interval 426 ms.  No ectopy.  October 10, 2018 ECG (independently read by me): Sinus rhythm with mild sinus arrhythmia at 71 bpm.  QTc interval 465 ms.  No significant ST changes.  No ectopy.  December 13, 2017 ECG (independently read by me): NSR at 64; IRBBB; normal intervals  July 2018 ECG (independently read by me): Sinus bradycardia 53 bpm.  No ectopy.  Normal intervals.  No ST segment changes.  December 2017 ECG  (independently read by me): Normal sinus rhythm at 74 bpm.  No ectopy.  Normal intervals.  No ST segment changes.  October 2016 ECG (independently read by me): Normal sinus rhythm at 61 bpm.  No ectopy.  Normal intervals.  May 2016 ECG (independently read by me): Sinus bradycardia 56 bpm.  No ectopy.  Mild RV conduction delay.  QTc interval 395  ECG (independently read by me): sinus bradycardia 56 bpm.  Mild RV conduction delay.  Borderline first-degree AV block with a PR interval at 208 ms.  No ectopy.  Prior 06/05/2013 ECG (independently read by me): Normal sinus rhythm at 63 beats per minute.  No ectopy; normal intervals.  Prior November 2014 ECG: Sinus rhythm at 68beats per minute. There is mild RV conduction delay. Intervals were normal.  LABS:  BMP Latest Ref Rng & Units 12/09/2020 04/09/2020 11/13/2019  Glucose 70 - 99 mg/dL 113(H) - 138(H)  BUN 8 - 27 mg/dL 21 - 23  Creatinine 0.76 - 1.27 mg/dL 1.42(H) 1.40(H) 1.62(H)  BUN/Creat Ratio 10 - 24 15 - 14  Sodium 134 - 144 mmol/L 144 - 145(H)  Potassium 3.5 - 5.2 mmol/L 4.8 - 4.4  Chloride 96 - 106 mmol/L 104 - 101  CO2 20 - 29 mmol/L 27 - 30(H)  Calcium 8.6 - 10.2 mg/dL 9.2 - 9.1   Hepatic Function Latest Ref Rng & Units 12/09/2020 03/22/2020 11/09/2019  Total Protein 6.0 - 8.5 g/dL 7.2 7.1 6.9  Albumin 3.7 - 4.7 g/dL 4.2 3.7 3.7  AST 0 - 40 IU/L _0 ALT 0 - 44 IU/L _1 Alk Phosphatase 44 - 121 IU/L 83 67 70  Total Bilirubin 0.0 - 1.2 mg/dL 0.4 0.4 0.4  Bilirubin, Direct 0.0 - 0.3 mg/dL - 0.1 0.1    CBC Latest Ref Rng & Units 12/09/2020 11/09/2019 07/20/2019  WBC 3.4 - 10.8 x10E3/uL 7.8 7.2 8.7  Hemoglobin 13.0 - 17.7 g/dL 13.7 9.7(L) 11.4(L)  Hematocrit 37.5 - 51.0 % 40.5 31.0(L) 33.8(L)  Platelets 150 - 450 x10E3/uL 161 172 177     Lipid Panel     Component Value Date/Time   CHOL 118 12/09/2020 0853   CHOL 116 03/30/2013 1053   TRIG 104 12/09/2020 0853   TRIG 80 03/30/2013 1053   HDL 36 (L) 12/09/2020 0853    HDL 34 (L) 03/30/2013 1053   CHOLHDL 3.3 12/09/2020 0853   CHOLHDL 2.5 03/31/2018 0508   VLDL 14 03/31/2018 0508   LDLCALC 62 12/09/2020 0853   LDLCALC 66 03/30/2013 1053     RADIOLOGY: No results found.  ECHO 04/10/2119 IMPRESSIONS   1. Left ventricular ejection fraction, by estimation, is 50 to 55%. The  left ventricle has low normal function. The left ventricle has no regional  wall motion abnormalities. Left ventricular diastolic parameters are  consistent with Grade II diastolic  dysfunction (pseudonormalization).   2. Right ventricular systolic function is normal. The right ventricular  size is  normal. There is moderately elevated pulmonary artery systolic  pressure.   3. Left atrial size was moderately dilated.   4. The mitral valve is normal in structure. Trivial mitral valve  regurgitation. No evidence of mitral stenosis.   5. The aortic valve is grossly normal. Aortic valve regurgitation is  trivial. No aortic stenosis is present.   IMPRESSION:  1. Coronary artery disease involving coronary bypass graft of native heart without angina pectoris   2. Hx of CABG   3. Essential hypertension   4. Chronic diastolic heart failure ([place])   5. Hyperlipidemia LDL goal <70   6. Medication management     ASSESSMENT AND PLAN:  Mr. Kloepfer is a 80 year old Caucasian male who has known CAD and is 21 years status post  CABG revascularization surgery in 2001. His catheterization in 2004 for recurrent chest pain showed patent grafts. His ejection fraction of 40-45% was documented in August 2012.  In October 2018 an echo revealed an EF of 60 to 65% with grade 2 diastolic dysfunction, aortic valve sclerosis without stenosis mild left atrial dilation.  An echo in December 2019 continued to show an EF of 60 to 65%.  There was now grade 1 diastolic dysfunction.  There was mild aortic stenosis. A Lexiscan Myoview study on January 04, 2018 was low risk and showed an EF of 51% without  ischemia.  He developed  pancreatitis and felt to have acsending cholangitis with E. coli sepsis.  He underwent sphincterotomy and stent placement in his bile duct by Dr. Justice Britain.  He has continued to have issues with significant shortness of breath which is stable but chronic.  His most recent catheterization from January 05, 2019  showed a 99% subtotally occluded vein graft to his right PDA/PL vessel which was treated successfully with 3 overlapping Resolute Onyx DES stents.  It is recommended that he continue aspirin/Plavix indefinitely.  Echo Doppler study on the same day showed an EF of 40 to 45%.  Presently, he is not having any anginal symptoms or significant shortness of breath.  When I saw him in June 2021 he was hurting all over.  At that time I reduced his statin therapy.  He is now on Zetia 10 mg and reduced dose of rosuvastatin 10 mg.  He has not had fasting laboratory.  His blood pressure today is stable and a repeat by me was 126/64 on amlodipine 5 mg, isosorbide 60 mg, Bystolic 10 mg.  He has a prescription for furosemide which he takes on an as-needed basis for swelling.  He has been on DAPT with aspirin/Plavix.  He is in need for upcoming dental extraction.  I have recommended he hold his clopidogrel for 4 to 5 days prior to the procedure to reduce bleeding.  He continues to be on pantoprazole 40 mg.  He is followed by GI and continues at times to experience some nausea following eating.  I am recommending complete set of fasting laboratory with a comprehensive metabolic panel, CBC, TSH and lipid studies.  I will contact him regarding the results and adjustments to his regimen will be made if necessary.  He will be seeing his primary care Dr. Isaac Bliss in February.  I will see him in 6 months for reevaluation or sooner as needed.   Troy Sine, MD, [facility]  12/19/2020 1:58 PM

## 2020-12-05 NOTE — Telephone Encounter (Signed)
Patient seen at visit today 11/17

## 2020-12-05 NOTE — Patient Instructions (Signed)
Medication Instructions:  Hold Plavix for the four days prior to your teeth extractions.  *If you need a refill on your cardiac medications before your next appointment, please call your pharmacy*   Lab Work: Return for fasting blood work (cmet, cbc, lipids, tsh)    Follow-Up: At [facility], you and your health needs are our priority.  As part of our continuing mission to provide you with exceptional heart care, we have created designated Provider Care Teams.  These Care Teams include your primary Cardiologist (physician) and Advanced Practice Providers (APPs -  Physician Assistants and Nurse Practitioners) who all work together to provide you with the care you need, when you need it.  We recommend signing up for the patient portal called "MyChart".  Sign up information is provided on this After Visit Summary.  MyChart is used to connect with patients for Virtual Visits (Telemedicine).  Patients are able to view lab/test results, encounter notes, upcoming appointments, etc.  Non-urgent messages can be sent to your provider as well.   To learn more about what you can do with MyChart, go to [email or web address].    Your next appointment:   6 month(s)  The format for your next appointment:   In Person  Provider:   [name], MD

## 2020-12-09 DIAGNOSIS — M5412 Radiculopathy, cervical region: Secondary | ICD-10-CM | POA: Diagnosis not present

## 2020-12-09 DIAGNOSIS — E7800 Pure hypercholesterolemia, unspecified: Secondary | ICD-10-CM | POA: Diagnosis not present

## 2020-12-09 DIAGNOSIS — I5033 Acute on chronic diastolic (congestive) heart failure: Secondary | ICD-10-CM | POA: Diagnosis not present

## 2020-12-10 LAB — LIPID PANEL
Chol/HDL Ratio: 3.3 ratio (ref 0.0–5.0)
Cholesterol, Total: 118 mg/dL (ref 100–199)
HDL: 36 mg/dL — ABNORMAL LOW (ref >39)
LDL Chol Calc (NIH): 62 mg/dL (ref 0–99)
Triglycerides: 104 mg/dL (ref 0–149)
VLDL Cholesterol Cal: 20 mg/dL (ref 5–40)

## 2020-12-10 LAB — COMPREHENSIVE METABOLIC PANEL
ALT: 11 IU/L (ref 0–44)
AST: 22 IU/L (ref 0–40)
Albumin/Globulin Ratio: 1.4 (ref 1.2–2.2)
Albumin: 4.2 g/dL (ref 3.7–4.7)
Alkaline Phosphatase: 83 IU/L (ref 44–121)
BUN/Creatinine Ratio: 15 (ref 10–24)
BUN: 21 mg/dL (ref 8–27)
Bilirubin Total: 0.4 mg/dL (ref 0.0–1.2)
CO2: 27 mmol/L (ref 20–29)
Calcium: 9.2 mg/dL (ref 8.6–10.2)
Chloride: 104 mmol/L (ref 96–106)
Creatinine, Ser: 1.42 mg/dL — ABNORMAL HIGH (ref 0.76–1.27)
Globulin, Total: 3 g/dL (ref 1.5–4.5)
Glucose: 113 mg/dL — ABNORMAL HIGH (ref 70–99)
Potassium: 4.8 mmol/L (ref 3.5–5.2)
Sodium: 144 mmol/L (ref 134–144)
Total Protein: 7.2 g/dL (ref 6.0–8.5)
eGFR: 51 mL/min/{1.73_m2} — ABNORMAL LOW (ref >59)

## 2020-12-10 LAB — CBC
Hematocrit: 40.5 % (ref 37.5–51.0)
Hemoglobin: 13.7 g/dL (ref 13.0–17.7)
MCH: 29.4 pg (ref 26.6–33.0)
MCHC: 33.8 g/dL (ref 31.5–35.7)
MCV: 87 fL (ref 79–97)
Platelets: 161 10*3/uL (ref 150–450)
RBC: 4.66 x10E6/uL (ref 4.14–5.80)
RDW: 13 % (ref 11.6–15.4)
WBC: 7.8 10*3/uL (ref 3.4–10.8)

## 2020-12-10 LAB — TSH: TSH: 3.72 u[IU]/mL (ref 0.450–4.500)

## 2020-12-19 ENCOUNTER — Encounter: Payer: Self-pay | Admitting: Cardiovascular Disease

## 2020-12-21 ENCOUNTER — Other Ambulatory Visit: Payer: Self-pay | Admitting: Cardiovascular Disease

## 2020-12-24 DIAGNOSIS — B351 Tinea unguium: Secondary | ICD-10-CM | POA: Diagnosis not present

## 2020-12-24 DIAGNOSIS — M79676 Pain in unspecified toe(s): Secondary | ICD-10-CM | POA: Diagnosis not present

## 2020-12-24 DIAGNOSIS — L84 Corns and callosities: Secondary | ICD-10-CM | POA: Diagnosis not present

## 2020-12-24 DIAGNOSIS — I70203 Unspecified atherosclerosis of native arteries of extremities, bilateral legs: Secondary | ICD-10-CM | POA: Diagnosis not present

## 2020-12-25 ENCOUNTER — Telehealth: Payer: Self-pay | Admitting: Pharmacist

## 2020-12-25 NOTE — Chronic Care Management (AMB) (Signed)
Chronic Care Management Pharmacy Assistant   Name: [name]  MRN: [id number] DOB: [birth year]  Reason for Encounter: Disease State / Diabetes Assessment Call   Conditions to be addressed/monitored: DMII  Recent office visits:  12/03/2020 [name] (Internal Medicine) - Diabetes mellitus and additional issues. No medication changes. Follow up in 3 months.  Recent consult visits:  12/05/2020 Jacob Majestic MD(cardiology) - Coronary artery disease involving coronary bypass graft of native heart without angina pectoris and additional issues. No medication changes. Follow up in 6 months.  Hospital visits:  None  Medications: Outpatient Encounter Medications as of 12/25/2020  Medication Sig   acetaminophen (TYLENOL) 500 MG tablet Take 1,000 mg by mouth every 6 (six) hours as needed for moderate pain.   amLODipine (NORVASC) 2.5 MG tablet Take 2 tablets by mouth once daily   aspirin EC 81 MG tablet Take 81 mg by mouth every evening.   cetirizine (ZYRTEC) 10 MG tablet Take 10 mg by mouth daily as needed for allergies.   clopidogrel (PLAVIX) 75 MG tablet Take 1 tablet (75 mg total) by mouth daily.   Coenzyme Q10 300 MG CAPS Take 300 mg by mouth every evening.   cyanocobalamin (,VITAMIN B-12,) 1000 MCG/ML injection Inject 1 mL (1,000 mcg total) into the skin every 30 (thirty) days.   ezetimibe (ZETIA) 10 MG tablet Take 1 tablet by mouth once daily   Ferrous Sulfate (IRON) 28 MG TABS Take 28 mg by mouth daily.   fluticasone (FLONASE) 50 MCG/ACT nasal spray Place 1-2 sprays into both nostrils at bedtime as needed for allergies or rhinitis.   furosemide (LASIX) 40 MG tablet Take 1 tablet (40 mg total) by mouth every other day. (Patient taking differently: Take 40 mg by mouth daily as needed for fluid or edema.)   guaifenesin (HUMIBID E) 400 MG TABS tablet Take 400 mg by mouth daily.   hyoscyamine (LEVSIN SL) 0.125 MG SL tablet Place 1 tablet (0.125 mg total) under the  tongue every 4 (four) hours as needed. (Patient taking differently: Place 0.125 mg under the tongue every 4 (four) hours as needed (spasms).)   isosorbide mononitrate (IMDUR) 60 MG 24 hr tablet Take 1 tablet by mouth once daily (Patient taking differently: Take 60 mg by mouth daily.)   Menthol, Topical Analgesic, (BENGAY EX) Apply 1 application topically daily as needed (pain).   METAMUCIL FIBER PO Take 3 capsules by mouth daily.   Multiple Vitamins-Minerals (MULTIVITAMIN WITH MINERALS) tablet Take 1 tablet by mouth daily.   Naphazoline-Pheniramine (OPCON-A) 0.027-0.315 % SOLN Place 1 drop into both eyes daily as needed (itching eyes).   nebivolol (BYSTOLIC) 10 MG tablet Take 1 tablet by mouth once daily   NEEDLE, DISP, 25 G (B-D DISP NEEDLE 25GX1") 25G X 1" MISC Inject 1000 mcg into muscle once a month.   nitroGLYCERIN (NITROSTAT) 0.4 MG SL tablet PLACE 1 TABLET UNDER THE TONGUE EVERY 5 MINUTES AS NEEDED FOR CHEST PAIN   oxyCODONE-acetaminophen (PERCOCET) 10-325 MG tablet Take 1 tablet by mouth 2 (two) times daily.   oxyCODONE-acetaminophen (PERCOCET) 10-325 MG tablet Take 1 tablet by mouth 2 (two) times daily as needed for pain.   oxyCODONE-acetaminophen (PERCOCET) 10-325 MG tablet Take 1 tablet by mouth 2 (two) times daily as needed for pain.   pantoprazole (PROTONIX) 40 MG tablet Take 1 tablet (40 mg total) by mouth daily.   Probiotic Product (PROBIOTIC ADVANCED PO) Take 1 tablet by mouth every evening.   rOPINIRole (REQUIP) 2 MG  tablet TAKE 1 TABLET BY MOUTH AT BEDTIME   rosuvastatin (CRESTOR) 10 MG tablet Take 1 tablet by mouth once daily   venlafaxine (EFFEXOR) 75 MG tablet Take 1 tablet by mouth once daily   No facility-administered encounter medications on file as of 12/25/2020.  Refill History: CLOPIDOGREL 75MG     TAB 07/07/2020 90   CYANOCOBALAM1000MCG INJ 10/15/2020 84   EZETIMIBE 10MG  TAB 10/14/2020 90   ISOSORB MONO ER 60MG  TAB 11/22/2020 90   NEBIVOLOL 10MG       TAB  10/23/2020 90   NITROGLYCER 0.4MG    SUB 08/23/2020 25   OMEPRAZOLE DR 40MG   CAP 09/24/2020 90   PANTOPRAZOLE SOD 40MG  TAB 11/12/2020 90   ROSUVASTATIN 10MG  TAB 10/23/2020 90   AMLODIPINE 2.5MG  TAB 42/59/5638 45   OXYCOD/ACETAM 10-325MG  TAB 12/03/2020 30   ROPINIROLE 2MG  TAB 11/06/2020 90   Recent Relevant Labs: Lab Results  Component Value Date/Time   HGBA1C 6.1 (A) 12/03/2020 09:54 AM   HGBA1C 5.8 (A) 08/29/2020 09:42 AM   HGBA1C 7.0 (H) 03/31/2018 01:09 AM    Kidney Function Lab Results  Component Value Date/Time   CREATININE 1.42 (H) 12/09/2020 08:53 AM   CREATININE 1.40 (H) 04/09/2020 11:01 AM   CREATININE 1.61 (H) 11/09/2019 09:39 AM   CREATININE 1.17 12/26/2015 10:34 AM   GFR 41.03 (L) 07/27/2018 04:18 PM   GFRNONAA 40 (L) 11/13/2019 10:06 AM   GFRAA 47 (L) 11/13/2019 10:06 AM    Current antihyperglycemic regimen:  None  What recent interventions/DTPs have been made to improve glycemic control:  None  Have there been any recent hospitalizations or ED visits since last visit with CPP? No  Patient denies hypoglycemic symptoms, including None  Patient denies hyperglycemic symptoms, including none  How often are you checking your blood sugar? Patient is Not checking  What are your blood sugars ranging? Patient is not checking  During the week, how often does your blood glucose drop below 70? Patient is not checking  Are you checking your feet daily/regularly? He goes to the foot doctor, he is not checking  Adherence Review: Is the patient currently on a STATIN medication? Yes Is the patient currently on ACE/ARB medication? No Does the patient have >5 day gap between last estimated fill dates? No  Care Gaps: AWV - message previously sent to [name] to schedule. Foot exam - never done Ophthalmology exam - never done Urine microalbumin - never done Hepatitis C screening - never done Last BP - 142/68 on 12/05/2020 HGA1C - 5.8 on 08/29/2020  Star  Rating Drugs: Rosuvastatin 10mg  - last filled on 10/23/2020 90DS at [place] [phone]

## 2021-01-04 ENCOUNTER — Other Ambulatory Visit: Payer: Self-pay | Admitting: Internal Medicine

## 2021-01-04 DIAGNOSIS — E038 Other specified hypothyroidism: Secondary | ICD-10-CM

## 2021-01-28 ENCOUNTER — Telehealth: Payer: Self-pay

## 2021-01-28 NOTE — Telephone Encounter (Signed)
° °  Pre-operative Risk Assessment    Patient Name: [name]  DOB: 1940-06-29 MRN: [id number]     Request for Surgical Clearance    Procedure:  Dental Extraction - Amount of Teeth to be Pulled:  8 with alveoloplasty additional placement of 2 implants for implant supported overdenture  Date of Surgery:  Clearance TBD                                 Surgeon:  Dr.Borchardt Surgeon's Group or Practice Name:  [facility] Phone number:  [phone] Fax number:  [phone]   Type of Clearance Requested:   - Medical  - Pharmacy:  Hold Aspirin Hold Plavix   Type of Anesthesia:  Not Indicated   Additional requests/questions:  Please advise surgeon/provider what medications should be held. Please fax a copy of clearance to the surgeon's office.  Evalee Mutton   01/28/2021, 4:50 PM

## 2021-01-29 DIAGNOSIS — M130 Polyarthritis, unspecified: Secondary | ICD-10-CM | POA: Diagnosis not present

## 2021-01-29 DIAGNOSIS — M5136 Other intervertebral disc degeneration, lumbar region: Secondary | ICD-10-CM | POA: Diagnosis not present

## 2021-01-29 DIAGNOSIS — M5030 Other cervical disc degeneration, unspecified cervical region: Secondary | ICD-10-CM | POA: Diagnosis not present

## 2021-01-29 NOTE — Telephone Encounter (Signed)
° °  Name: [name]  DOB: [birth year]  MRN: [id number]   Primary Cardiologist: [name], MD  Chart reviewed as part of pre-operative protocol coverage. Patient was contacted 01/29/2021 in reference to pre-operative risk assessment for pending surgery as outlined below.  [name] was last seen on 12/05/2020 by Dr. Claiborne Billings.  Since that day, [name] has done well without chest pain or worsening dyspnea.  Therefore, based on ACC/AHA guidelines, the patient would be at acceptable risk for the planned procedure without further cardiovascular testing.   Patient may hold plavix for 5 days prior to the procedure and restart as soon as possible afterward at the surgeon's discretion. He should continue on the aspirin through the procedure. This recommendation was discussed with [facility] Dr. Janeal Holmes office.   He does not need SBE prophylaxis since he does not have prior history of valve surgery, congenital heart issue or prior history of endocarditis.    The patient was advised that if he develops new symptoms prior to surgery to contact our office to arrange for a follow-up visit, and he verbalized understanding.  I will route this recommendation to the requesting party via Epic fax function and remove from pre-op pool. Please call with questions.  Almyra Deforest, [place] 01/29/2021, 9:53 AM

## 2021-01-30 DIAGNOSIS — J310 Chronic rhinitis: Secondary | ICD-10-CM | POA: Diagnosis not present

## 2021-01-30 DIAGNOSIS — J342 Deviated nasal septum: Secondary | ICD-10-CM | POA: Diagnosis not present

## 2021-01-30 DIAGNOSIS — R0982 Postnasal drip: Secondary | ICD-10-CM | POA: Diagnosis not present

## 2021-02-07 ENCOUNTER — Other Ambulatory Visit: Payer: Self-pay | Admitting: Otolaryngology

## 2021-02-08 ENCOUNTER — Other Ambulatory Visit: Payer: Self-pay | Admitting: Internal Medicine

## 2021-02-13 DIAGNOSIS — R3912 Poor urinary stream: Secondary | ICD-10-CM | POA: Diagnosis not present

## 2021-02-13 DIAGNOSIS — Z8551 Personal history of malignant neoplasm of bladder: Secondary | ICD-10-CM | POA: Diagnosis not present

## 2021-02-24 ENCOUNTER — Telehealth: Payer: Self-pay | Admitting: Pharmacist

## 2021-02-24 NOTE — Chronic Care Management (AMB) (Signed)
Chronic Care Management Pharmacy Assistant   Name: [name]  MRN: [id number] DOB: [birth year]  Reason for Encounter: Disease State / Hypertension Assessment Call   Conditions to be addressed/monitored: HTN  Recent office visits:  None  Recent consult visits:  None  Hospital visits:  None  Medications: Outpatient Encounter Medications as of 02/24/2021  Medication Sig   acetaminophen (TYLENOL) 500 MG tablet Take 1,000 mg by mouth every 6 (six) hours as needed for moderate pain.   amLODipine (NORVASC) 2.5 MG tablet Take 2 tablets by mouth once daily   aspirin EC 81 MG tablet Take 81 mg by mouth every evening.   cetirizine (ZYRTEC) 10 MG tablet Take 10 mg by mouth daily as needed for allergies.   clopidogrel (PLAVIX) 75 MG tablet Take 1 tablet (75 mg total) by mouth daily.   Coenzyme Q10 300 MG CAPS Take 300 mg by mouth every evening.   cyanocobalamin (,VITAMIN B-12,) 1000 MCG/ML injection Inject 1 mL (1,000 mcg total) into the skin every 30 (thirty) days.   ezetimibe (ZETIA) 10 MG tablet Take 1 tablet by mouth once daily   Ferrous Sulfate (IRON) 28 MG TABS Take 28 mg by mouth daily.   fluticasone (FLONASE) 50 MCG/ACT nasal spray Place 1-2 sprays into both nostrils at bedtime as needed for allergies or rhinitis.   furosemide (LASIX) 40 MG tablet Take 1 tablet (40 mg total) by mouth every other day. (Patient taking differently: Take 40 mg by mouth daily as needed for fluid or edema.)   guaifenesin (HUMIBID E) 400 MG TABS tablet Take 400 mg by mouth daily.   hyoscyamine (LEVSIN SL) 0.125 MG SL tablet Place 1 tablet (0.125 mg total) under the tongue every 4 (four) hours as needed. (Patient taking differently: Place 0.125 mg under the tongue every 4 (four) hours as needed (spasms).)   isosorbide mononitrate (IMDUR) 60 MG 24 hr tablet Take 1 tablet by mouth once daily (Patient taking differently: Take 60 mg by mouth daily.)   Menthol, Topical Analgesic, (BENGAY EX) Apply 1  application topically daily as needed (pain).   METAMUCIL FIBER PO Take 3 capsules by mouth daily.   Multiple Vitamins-Minerals (MULTIVITAMIN WITH MINERALS) tablet Take 1 tablet by mouth daily.   Naphazoline-Pheniramine (OPCON-A) 0.027-0.315 % SOLN Place 1 drop into both eyes daily as needed (itching eyes).   nebivolol (BYSTOLIC) 10 MG tablet Take 1 tablet by mouth once daily   NEEDLE, DISP, 25 G (B-D DISP NEEDLE 25GX1") 25G X 1" MISC Inject 1000 mcg into muscle once a month.   nitroGLYCERIN (NITROSTAT) 0.4 MG SL tablet PLACE 1 TABLET UNDER THE TONGUE EVERY 5 MINUTES AS NEEDED FOR CHEST PAIN   oxyCODONE-acetaminophen (PERCOCET) 10-325 MG tablet Take 1 tablet by mouth 2 (two) times daily.   oxyCODONE-acetaminophen (PERCOCET) 10-325 MG tablet Take 1 tablet by mouth 2 (two) times daily as needed for pain.   oxyCODONE-acetaminophen (PERCOCET) 10-325 MG tablet Take 1 tablet by mouth 2 (two) times daily as needed for pain.   pantoprazole (PROTONIX) 40 MG tablet Take 1 tablet (40 mg total) by mouth daily.   Probiotic Product (PROBIOTIC ADVANCED PO) Take 1 tablet by mouth every evening.   rOPINIRole (REQUIP) 2 MG tablet TAKE 1 TABLET BY MOUTH AT BEDTIME   rosuvastatin (CRESTOR) 10 MG tablet Take 1 tablet by mouth once daily   venlafaxine (EFFEXOR) 75 MG tablet Take 1 tablet by mouth once daily   No facility-administered encounter medications on file as of 02/24/2021.  Fill History: CLOPIDOGREL 75MG     TAB 01/01/2021 90   CYANOCOBALAM1000MCG INJ 01/01/2021 84   EZETIMIBE 10MG  TAB 01/07/2021 90   ISOSORB MONO ER 60MG  TAB 11/22/2020 90   NEBIVOLOL 10MG       TAB 01/17/2021 90   NITROGLYCER 0.4MG    SUB 08/23/2020 25   PANTOPRAZOLE 40MG  TAB 02/05/2021 90   ROSUVASTATIN 10MG  TAB 01/17/2021 90   VENLAFAXINE 75MG     TAB 01/07/2021 90   AMLODIPINE 2.5MG  TAB 02/05/2021 45   OXYCOD/ACETAM 10-325MG  TAB 01/22/2021 30   ROPINIROLE 2MG  TAB 11/06/2020 90  Reviewed chart prior to disease state call.  Spoke with patient regarding BP  Recent Office Vitals: BP Readings from Last 3 Encounters:  12/05/20 (!) 142/68  12/03/20 110/68  08/29/20 110/68   Pulse Readings from Last 3 Encounters:  12/05/20 (!) 59  12/03/20 61  08/29/20 (!) 58    Wt Readings from Last 3 Encounters:  12/05/20 179 lb 3.2 oz (81.3 kg)  12/03/20 178 lb 11.2 oz (81.1 kg)  08/29/20 172 lb 8 oz (78.2 kg)     Kidney Function Lab Results  Component Value Date/Time   CREATININE 1.42 (H) 12/09/2020 08:53 AM   CREATININE 1.40 (H) 04/09/2020 11:01 AM   CREATININE 1.61 (H) 11/09/2019 09:39 AM   CREATININE 1.17 12/26/2015 10:34 AM   GFR 41.03 (L) 07/27/2018 04:18 PM   GFRNONAA 40 (L) 11/13/2019 10:06 AM   GFRAA 47 (L) 11/13/2019 10:06 AM    BMP Latest Ref Rng & Units 12/09/2020 04/09/2020 11/13/2019  Glucose 70 - 99 mg/dL 113(H) - 138(H)  BUN 8 - 27 mg/dL 21 - 23  Creatinine 0.76 - 1.27 mg/dL 1.42(H) 1.40(H) 1.62(H)  BUN/Creat Ratio 10 - 24 15 - 14  Sodium 134 - 144 mmol/L 144 - 145(H)  Potassium 3.5 - 5.2 mmol/L 4.8 - 4.4  Chloride 96 - 106 mmol/L 104 - 101  CO2 20 - 29 mmol/L 27 - 30(H)  Calcium 8.6 - 10.2 mg/dL 9.2 - 9.1    Current antihypertensive regimen:  Amlodipine 2.5 mg 2 tablets daily Bystolic 10 mg daily  How often are you checking your Blood Pressure?   Current home BP readings:   What recent interventions/DTPs have been made by any provider to improve Blood Pressure control since last CPP Visit:   Any recent hospitalizations or ED visits since last visit with CPP?   What diet changes have been made to improve Blood Pressure Control?      What exercise is being done to improve your Blood Pressure Control?    Adherence Review: Is the patient currently on ACE/ARB medication? No Does the patient have >5 day gap between last estimated fill dates? No  Care Gaps: AWV - message previously sent to [name] to schedule. Last BP - 142/68 on 12/05/2020 Last A1C - 6.1 on 12/03/2020 Foot  exam - never done Ophthalmology exam - never done Urine microalbumin - never done Hepatitis C screening - never done  Star Rating Drugs: Rosuvastatin 10mg  - last filled on 01/17/2021 90DS at [place] [phone]

## 2021-02-28 ENCOUNTER — Telehealth: Payer: Self-pay

## 2021-02-28 NOTE — Telephone Encounter (Signed)
Dr. Claiborne Billings to review.  I have left a message with Dr. Deeann Saint office to see if aspirin absolutely will need to be held prior to septoplasty.  Patient previously had to hold the Plavix for 5 days prior to dental extractions.  According to his family member, he is doing fine without any exertional chest pain or worsening dyspnea.  This septoplasty is a low risk procedure and the patient should be cleared from the medical perspective.  I also instructed the family to hold the Plavix for 5 days as well prior to the procedure.  Dr. Claiborne Billings, if Dr. Deeann Saint office absolutely required the patient to come off of aspirin for 5 days, would you be okay with that?  Last PCI was in October 2020.  Dr. Benjamine Mola is currently out of office, he will be back in the office after 1 PM on Monday.  Callback pool to call Dr. Deeann Saint office to figure out if he is able to do the surgery on the aspirin.   Otherwise, I think the patient should be cleared from the medical perspective  Dr. Claiborne Billings, you can forward your response to P CV DIV PREOP

## 2021-02-28 NOTE — Telephone Encounter (Signed)
° °  Pre-operative Risk Assessment    Patient Name: [name]  DOB: 1940-08-16 MRN: [id number]      Request for Surgical Clearance    Procedure:  Septoplasty  Date of Surgery:  Clearance 04/14/21                                 Surgeon:  Dr. Benjamine Mola Surgeon's Group or Practice Name:  [facility]im, MD, PA Phone number:  [phone] Fax number:  [phone]   Type of Clearance Requested:   - Medical  - Pharmacy:  Hold Aspirin and Clopidogrel (Plavix) for 5 days.   Type of Anesthesia:  General    Additional requests/questions:  Please advise surgeon/provider what medications should be held. Please fax a copy of Clerance to the surgeon's office.  Signed, Maple Mirza   02/28/2021, 12:47 PM

## 2021-03-03 NOTE — Telephone Encounter (Signed)
I s/w Dr. Deeann Saint office today and have confirmed the pt will need to hold both Plavix and ASA as stated in clearance. I assured Dr. Deeann Saint office that I will update the pre op provider. I did inform that we are also awaiting for Dr. Evette Georges ok as well for holding ASA x 5 days along with the Plavix x 5 days. Once the pt has been cleared we will be sure to fax clearance notes.

## 2021-03-05 ENCOUNTER — Ambulatory Visit (INDEPENDENT_AMBULATORY_CARE_PROVIDER_SITE_OTHER): Payer: Medicare Other | Admitting: Internal Medicine

## 2021-03-05 ENCOUNTER — Encounter: Payer: Self-pay | Admitting: Internal Medicine

## 2021-03-05 VITALS — BP 135/65 | HR 55 | Temp 98.4°F | Wt 173.9 lb

## 2021-03-05 DIAGNOSIS — E785 Hyperlipidemia, unspecified: Secondary | ICD-10-CM | POA: Diagnosis not present

## 2021-03-05 DIAGNOSIS — G894 Chronic pain syndrome: Secondary | ICD-10-CM

## 2021-03-05 DIAGNOSIS — E119 Type 2 diabetes mellitus without complications: Secondary | ICD-10-CM | POA: Diagnosis not present

## 2021-03-05 DIAGNOSIS — E1169 Type 2 diabetes mellitus with other specified complication: Secondary | ICD-10-CM

## 2021-03-05 DIAGNOSIS — I5032 Chronic diastolic (congestive) heart failure: Secondary | ICD-10-CM

## 2021-03-05 LAB — POCT GLYCOSYLATED HEMOGLOBIN (HGB A1C): Hemoglobin A1C: 6 % — AB (ref 4.0–5.6)

## 2021-03-05 MED ORDER — OXYCODONE-ACETAMINOPHEN 10-325 MG PO TABS: 1.0000 | ORAL_TABLET | Freq: Two times a day (BID) | ORAL | 0 refills | Status: DC | PRN

## 2021-03-05 MED ORDER — OXYCODONE-ACETAMINOPHEN 10-325 MG PO TABS: 1.0000 | ORAL_TABLET | Freq: Two times a day (BID) | ORAL | 0 refills | Status: DC

## 2021-03-05 NOTE — Progress Notes (Signed)
Established Patient Office Visit     This visit occurred during the SARS-CoV-2 public health emergency.  Safety protocols were in place, including screening questions prior to the visit, additional usage of staff PPE, and extensive cleaning of exam room while observing appropriate contact time as indicated for disinfecting solutions.    CC/Reason for Visit: 28-month follow-up chronic medical conditions  HPI: [name] is a 81 y.o. male who is coming in today for the above mentioned reasons. Past Medical History is significant for: Type 2 diabetes that has been well controlled, heart failure with preserved ejection fraction, chronic pain syndrome with multiple joint pain including neck, shoulders, hands, hips, knees.  He has a history of ascending cholangitis and pancreatitis due to biliary strictures, choledocholithiasis followed by Dr. Rush Landmark.  He is on a chronic pain contract with me for oxycodone for which he takes half a tablet 3 times a day.  His main complaint continues to be diffuse joint achiness.  He tells me he saw a rheumatologist about 6 weeks ago, however I have no reports from this visit.  He will be having dental extraction in March and it appears he will be having a deviated septum repair by ENT.  He needs refills of his oxycodone today.  He continues to have pain that he believes is related to his "liver".   Past Medical/Surgical History: Past Medical History:  Diagnosis Date   Allergy    B12 deficiency anemia    Blood transfusion without reported diagnosis    CAD (coronary artery disease)    Cancer (HCC)    bladder-    Colon polyps    COPD (chronic obstructive pulmonary disease) (HCC)    Depression    Esophagus, Barrett's    GERD (gastroesophageal reflux disease)    History of bladder cancer    Bladder cancer "8 times"   History of hiatal hernia    Hyperlipidemia    Hypertension    Localized osteoarthrosis, lower leg    Myocardial infarction ([place])  2021   Restless leg syndrome    Sleep apnea    does not wear cpap   Stenosis of esophagus     Past Surgical History:  Procedure Laterality Date   BILIARY BRUSHING  04/01/2018   Procedure: BILIARY BRUSHING;  Surgeon: Irving Copas., MD;  Location: [facility] ENDOSCOPY;  Service: Gastroenterology;;   BILIARY BRUSHING  09/12/2018   Procedure: BILIARY BRUSHING;  Surgeon: Irving Copas., MD;  Location: [facility] ENDOSCOPY;  Service: Gastroenterology;;   BILIARY BRUSHING  11/28/2018   Procedure: BILIARY BRUSHING;  Surgeon: Irving Copas., MD;  Location: [place];  Service: Gastroenterology;;   BILIARY BRUSHING  03/11/2020   Procedure: BILIARY BRUSHING;  Surgeon: Irving Copas., MD;  Location: [facility] ENDOSCOPY;  Service: Gastroenterology;;   BILIARY DILATION  09/12/2018   Procedure: BILIARY DILATION;  Surgeon: Irving Copas., MD;  Location: [place];  Service: Gastroenterology;;   BILIARY DILATION  11/28/2018   Procedure: BILIARY DILATION;  Surgeon: Irving Copas., MD;  Location: [place];  Service: Gastroenterology;;   BILIARY DILATION  03/11/2020   Procedure: BILIARY DILATION;  Surgeon: Irving Copas., MD;  Location: [facility] ENDOSCOPY;  Service: Gastroenterology;;   BILIARY STENT PLACEMENT  04/01/2018   Procedure: BILIARY STENT PLACEMENT;  Surgeon: Irving Copas., MD;  Location: [place];  Service: Gastroenterology;;   BILIARY STENT PLACEMENT  09/12/2018   Procedure: BILIARY STENT PLACEMENT;  Surgeon: Irving Copas., MD;  Location: [facility]  ENDOSCOPY;  Service: Gastroenterology;;   BILIARY STENT PLACEMENT  11/28/2018   Procedure: BILIARY STENT PLACEMENT;  Surgeon: Irving Copas., MD;  Location: [place];  Service: Gastroenterology;;   BIOPSY  04/01/2018   Procedure: BIOPSY;  Surgeon: Irving Copas., MD;  Location: [place];  Service: Gastroenterology;;   BIOPSY  09/12/2018   Procedure: BIOPSY;  Surgeon:  Irving Copas., MD;  Location: [place];  Service: Gastroenterology;;   BIOPSY  11/28/2018   Procedure: BIOPSY;  Surgeon: Irving Copas., MD;  Location: [place];  Service: Gastroenterology;;   BIOPSY  03/11/2020   Procedure: BIOPSY;  Surgeon: Irving Copas., MD;  Location: [facility] ENDOSCOPY;  Service: Gastroenterology;;   bladder cancer      x 8 cystoscopy   CERVICAL DISCECTOMY     ACDF   COLONOSCOPY  11/17/2005   normal    CORONARY ARTERY BYPASS GRAFT     x4   CORONARY STENT INTERVENTION N/A 01/05/2019   Procedure: CORONARY STENT INTERVENTION;  Surgeon: Nelva Bush, MD;  Location: [place] [facility];  Service: Cardiovascular;  Laterality: N/A;   ENDOSCOPIC MUCOSAL RESECTION  09/12/2018   Procedure: ENDOSCOPIC MUCOSAL RESECTION;  Surgeon: Rush Landmark, Telford Nab., MD;  Location: [facility] ENDOSCOPY;  Service: Gastroenterology;;   ENDOSCOPIC RETROGRADE CHOLANGIOPANCREATOGRAPHY (ERCP) WITH PROPOFOL N/A 04/01/2018   Procedure: ENDOSCOPIC RETROGRADE CHOLANGIOPANCREATOGRAPHY (ERCP) WITH PROPOFOL;  Surgeon: Irving Copas., MD;  Location: [place];  Service: Gastroenterology;  Laterality: N/A;   ENDOSCOPIC RETROGRADE CHOLANGIOPANCREATOGRAPHY (ERCP) WITH PROPOFOL N/A 09/12/2018   Procedure: ENDOSCOPIC RETROGRADE CHOLANGIOPANCREATOGRAPHY (ERCP) WITH PROPOFOL;  Surgeon: Rush Landmark Telford Nab., MD;  Location: [place];  Service: Gastroenterology;  Laterality: N/A;   ENDOSCOPIC RETROGRADE CHOLANGIOPANCREATOGRAPHY (ERCP) WITH PROPOFOL N/A 03/11/2020   Procedure: ENDOSCOPIC RETROGRADE CHOLANGIOPANCREATOGRAPHY (ERCP) WITH PROPOFOL;  Surgeon: Rush Landmark Telford Nab., MD;  Location: [facility] ENDOSCOPY;  Service: Gastroenterology;  Laterality: N/A;   ENDOSCOPIC RETROGRADE CHOLANGIOPANCREATOGRAPHY (ERCP) WITH PROPOFOL N/A 06/03/2020   Procedure: ENDOSCOPIC RETROGRADE CHOLANGIOPANCREATOGRAPHY (ERCP) WITH PROPOFOL;  Surgeon: Rush Landmark Telford Nab., MD;  Location: [facility] ENDOSCOPY;   Service: Gastroenterology;  Laterality: N/A;   ERCP N/A 11/28/2018   Procedure: ENDOSCOPIC RETROGRADE CHOLANGIOPANCREATOGRAPHY (ERCP) +EGD with spyglass;  Surgeon: Rush Landmark Telford Nab., MD;  Location: [place];  Service: Gastroenterology;  Laterality: N/A;   ESOPHAGOGASTRODUODENOSCOPY  04/29/2010   ESOPHAGOGASTRODUODENOSCOPY (EGD) WITH PROPOFOL N/A 04/01/2018   Procedure: ESOPHAGOGASTRODUODENOSCOPY (EGD) WITH PROPOFOL;  Surgeon: Rush Landmark Telford Nab., MD;  Location: [place];  Service: Gastroenterology;  Laterality: N/A;   ESOPHAGOGASTRODUODENOSCOPY (EGD) WITH PROPOFOL N/A 09/12/2018   Procedure: ESOPHAGOGASTRODUODENOSCOPY (EGD) WITH PROPOFOL;  Surgeon: Rush Landmark Telford Nab., MD;  Location: [place];  Service: Gastroenterology;  Laterality: N/A;   ESOPHAGOGASTRODUODENOSCOPY (EGD) WITH PROPOFOL N/A 11/28/2018   Procedure: ESOPHAGOGASTRODUODENOSCOPY (EGD) WITH PROPOFOL;  Surgeon: Rush Landmark Telford Nab., MD;  Location: [place];  Service: Gastroenterology;  Laterality: N/A;   ESOPHAGOGASTRODUODENOSCOPY (EGD) WITH PROPOFOL N/A 03/11/2020   Procedure: ESOPHAGOGASTRODUODENOSCOPY (EGD) WITH PROPOFOL;  Surgeon: Rush Landmark Telford Nab., MD;  Location: [facility] ENDOSCOPY;  Service: Gastroenterology;  Laterality: N/A;   ESOPHAGOGASTRODUODENOSCOPY (EGD) WITH PROPOFOL N/A 06/03/2020   Procedure: ESOPHAGOGASTRODUODENOSCOPY (EGD) WITH PROPOFOL;  Surgeon: Rush Landmark Telford Nab., MD;  Location: [facility] ENDOSCOPY;  Service: Gastroenterology;  Laterality: N/A;   EUS  04/01/2018   Procedure: FULL UPPER ENDOSCOPIC ULTRASOUND (EUS) RADIAL;  Surgeon: Irving Copas., MD;  Location: [place];  Service: Gastroenterology;;   EUS N/A 09/12/2018   Procedure: UPPER ENDOSCOPIC ULTRASOUND (EUS) RADIAL;  Surgeon: Irving Copas., MD;  Location: [place];  Service:  Gastroenterology;  Laterality: N/A;   FINE NEEDLE ASPIRATION  09/12/2018   Procedure: FINE NEEDLE ASPIRATION (FNA) LINEAR;  Surgeon:  Irving Copas., MD;  Location: [place];  Service: Gastroenterology;;   HAND SURGERY Left 2018   saw accident   HEMOSTASIS CLIP PLACEMENT  09/12/2018   Procedure: HEMOSTASIS CLIP PLACEMENT;  Surgeon: Irving Copas., MD;  Location: [place];  Service: Gastroenterology;;   HEMOSTASIS CLIP PLACEMENT  06/03/2020   Procedure: HEMOSTASIS CLIP PLACEMENT;  Surgeon: Irving Copas., MD;  Location: [facility] END[facility]COPY;  Service: Gastroenterology;;   I & D EXTREMITY Left 11/24/2016   Procedure: IRRIGATION AND DEBRIDEMENT LEFT HAND, THUMB, INDEX, MIDDLE, RING, AND SMALL FINGERS WITH RECONSTRUCTION;  Surgeon: Roseanne Kaufman, MD;  Location: [place];  Service: Orthopedics;  Laterality: Left;   KNEE ARTHROSCOPY Left    LEFT HEART CATH AND CORS/GRAFTS ANGIOGRAPHY N/A 01/05/2019   Procedure: LEFT HEART CATH AND CORS/GRAFTS ANGIOGRAPHY;  Surgeon: Nelva Bush, MD;  Location: [place] [facility];  Service: Cardiovascular;  Laterality: N/A;   LUMBAR LAMINECTOMY     and fusion x 2   NASAL SINUS SURGERY     POPLITEAL SYNOVIAL CYST EXCISION     REMOVAL OF STONES  04/01/2018   Procedure: REMOVAL OF STONES;  Surgeon: Rush Landmark Telford Nab., MD;  Location: [place];  Service: Gastroenterology;;   REMOVAL OF STONES  09/12/2018   Procedure: REMOVAL OF STONES;  Surgeon: Irving Copas., MD;  Location: [place];  Service: Gastroenterology;;   REMOVAL OF STONES  11/28/2018   Procedure: REMOVAL OF STONES;  Surgeon: Irving Copas., MD;  Location: [place];  Service: Gastroenterology;;   REMOVAL OF STONES  03/11/2020   Procedure: REMOVAL OF STONES;  Surgeon: Irving Copas., MD;  Location: [facility];  Service: Gastroenterology;;   REMOVAL OF STONES  06/03/2020   Procedure: REMOVAL OF STONES;  Surgeon: Irving Copas., MD;  Location: [facility];  Service: Gastroenterology;;   Azzie Almas DILATION N/A 09/12/2018   Procedure: Renato Battles;  Surgeon:  Irving Copas., MD;  Location: [place];  Service: Gastroenterology;  Laterality: N/A;   SAVORY DILATION N/A 11/28/2018   Procedure: SAVORY DILATION;  Surgeon: Rush Landmark Telford Nab., MD;  Location: [place];  Service: Gastroenterology;  Laterality: N/A;   SPHINCTEROTOMY  04/01/2018   Procedure: SPHINCTEROTOMY;  Surgeon: Mansouraty, Telford Nab., MD;  Location: [place];  Service: Gastroenterology;;   Bess Kinds CHOLANGIOSCOPY N/A 11/28/2018   Procedure: KGMWNUUV CHOLANGIOSCOPY;  Surgeon: Irving Copas., MD;  Location: [place];  Service: Gastroenterology;  Laterality: N/A;   SPYGLASS CHOLANGIOSCOPY N/A 06/03/2020   Procedure: SPYGLASS CHOLANGIOSCOPY;  Surgeon: Irving Copas., MD;  Location: [facility] END[facility];  Service: Gastroenterology;  Laterality: N/A;   STENT REMOVAL  09/12/2018   Procedure: STENT REMOVAL;  Surgeon: Irving Copas., MD;  Location: [place];  Service: Gastroenterology;;   Lavell Islam REMOVAL  11/28/2018   Procedure: STENT REMOVAL;  Surgeon: Irving Copas., MD;  Location: [place];  Service: Gastroenterology;;   Lavell Islam REMOVAL  03/11/2020   Procedure: STENT REMOVAL;  Surgeon: Irving Copas., MD;  Location: [facility];  Service: Gastroenterology;;   SUBMUCOSAL LIFTING INJECTION  09/12/2018   Procedure: SUBMUCOSAL LIFTING INJECTION;  Surgeon: Irving Copas., MD;  Location: [place];  Service: Gastroenterology;;    Social History:  reports that he quit smoking about 44 years ago. His smoking use included cigarettes. He has a 2.50 pack-year smoking history. He has never used smokeless tobacco. He reports that he  does not drink alcohol and does not use drugs.  Allergies: Allergies  Allergen Reactions   Losartan Potassium Other (See Comments)    Hyperkalemia   Tape Itching and Rash    reddened skin     Family History:  Family History  Problem Relation Age of Onset   Melanoma Mother    Stroke Father     Hypertension Father    Coronary artery disease Other    Colon cancer Neg Hx    Esophageal cancer Neg Hx    Stomach cancer Neg Hx    Rectal cancer Neg Hx    Pancreatic cancer Neg Hx    Liver disease Neg Hx    Inflammatory bowel disease Neg Hx      Current Outpatient Medications:    acetaminophen (TYLENOL) 500 MG tablet, Take 1,000 mg by mouth every 6 (six) hours as needed for moderate pain., Disp: , Rfl:    amLODipine (NORVASC) 2.5 MG tablet, Take 2 tablets by mouth once daily, Disp: 90 tablet, Rfl: 3   aspirin EC 81 MG tablet, Take 81 mg by mouth every evening., Disp: , Rfl:    cetirizine (ZYRTEC) 10 MG tablet, Take 10 mg by mouth daily as needed for allergies., Disp: , Rfl:    clopidogrel (PLAVIX) 75 MG tablet, Take 1 tablet (75 mg total) by mouth daily., Disp: 90 tablet, Rfl: 3   Coenzyme Q10 300 MG CAPS, Take 300 mg by mouth every evening., Disp: , Rfl:    cyanocobalamin (,VITAMIN B-12,) 1000 MCG/ML injection, Inject 1 mL (1,000 mcg total) into the skin every 30 (thirty) days., Disp: 6 mL, Rfl: 1   ezetimibe (ZETIA) 10 MG tablet, Take 1 tablet by mouth once daily, Disp: 90 tablet, Rfl: 1   Ferrous Sulfate (IRON) 28 MG TABS, Take 28 mg by mouth daily., Disp: , Rfl:    fluticasone (FLONASE) 50 MCG/ACT nasal spray, Place 1-2 sprays into both nostrils at bedtime as needed for allergies or rhinitis., Disp: , Rfl:    furosemide (LASIX) 40 MG tablet, Take 1 tablet (40 mg total) by mouth every other day. (Patient taking differently: Take 40 mg by mouth daily as needed for fluid or edema.), Disp: 90 tablet, Rfl: 1   guaifenesin (HUMIBID E) 400 MG TABS tablet, Take 400 mg by mouth daily., Disp: , Rfl:    hyoscyamine (LEVSIN SL) 0.125 MG SL tablet, Place 1 tablet (0.125 mg total) under the tongue every 4 (four) hours as needed. (Patient taking differently: Place 0.125 mg under the tongue every 4 (four) hours as needed (spasms).), Disp: 30 tablet, Rfl: 2   isosorbide mononitrate (IMDUR) 60 MG 24  hr tablet, Take 1 tablet by mouth once daily (Patient taking differently: Take 60 mg by mouth daily.), Disp: 90 tablet, Rfl: 3   Menthol, Topical Analgesic, (BENGAY EX), Apply 1 application topically daily as needed (pain)., Disp: , Rfl:    METAMUCIL FIBER PO, Take 3 capsules by mouth daily., Disp: , Rfl:    Multiple Vitamins-Minerals (MULTIVITAMIN WITH MINERALS) tablet, Take 1 tablet by mouth daily., Disp: , Rfl:    Naphazoline-Pheniramine (OPCON-A) 0.027-0.315 % SOLN, Place 1 drop into both eyes daily as needed (itching eyes)., Disp: , Rfl:    nebivolol (BYSTOLIC) 10 MG tablet, Take 1 tablet by mouth once daily, Disp: 90 tablet, Rfl: 1   NEEDLE, DISP, 25 G (B-D DISP NEEDLE 25GX1") 25G X 1" MISC, Inject 1000 mcg into muscle once a month., Disp: 50 each, Rfl: 0  nitroGLYCERIN (NITROSTAT) 0.4 MG SL tablet, PLACE 1 TABLET UNDER THE TONGUE EVERY 5 MINUTES AS NEEDED FOR CHEST PAIN, Disp: 25 tablet, Rfl: 2   Probiotic Product (PROBIOTIC ADVANCED PO), Take 1 tablet by mouth every evening., Disp: , Rfl:    rOPINIRole (REQUIP) 2 MG tablet, TAKE 1 TABLET BY MOUTH AT BEDTIME, Disp: 90 tablet, Rfl: 0   rosuvastatin (CRESTOR) 10 MG tablet, Take 1 tablet by mouth once daily, Disp: 90 tablet, Rfl: 1   venlafaxine (EFFEXOR) 75 MG tablet, Take 1 tablet by mouth once daily, Disp: 90 tablet, Rfl: 1   oxyCODONE-acetaminophen (PERCOCET) 10-325 MG tablet, Take 1 tablet by mouth 2 (two) times daily., Disp: 60 tablet, Rfl: 0   oxyCODONE-acetaminophen (PERCOCET) 10-325 MG tablet, Take 1 tablet by mouth 2 (two) times daily as needed for pain., Disp: 60 tablet, Rfl: 0   oxyCODONE-acetaminophen (PERCOCET) 10-325 MG tablet, Take 1 tablet by mouth 2 (two) times daily as needed for pain., Disp: 60 tablet, Rfl: 0   pantoprazole (PROTONIX) 40 MG tablet, Take 1 tablet (40 mg total) by mouth daily., Disp: 90 tablet, Rfl: 3  Review of Systems:  Constitutional: Denies fever, chills, diaphoresis, appetite change. HEENT: Denies  photophobia, eye pain, redness, hearing loss, ear pain, congestion, sore throat, rhinorrhea, sneezing, mouth sores, trouble swallowing, neck pain, neck stiffness and tinnitus.   Respiratory: Denies SOB, DOE, cough, chest tightness,  and wheezing.   Cardiovascular: Denies chest pain, palpitations and leg swelling.  Gastrointestinal: Denies nausea, vomiting, abdominal pain, diarrhea, constipation, blood in stool and abdominal distention.  Genitourinary: Denies dysuria, urgency, frequency, hematuria, flank pain and difficulty urinating.  Endocrine: Denies: hot or cold intolerance, sweats, changes in hair or nails, polyuria, polydipsia. Musculoskeletal: Positive for myalgias, back pain, joint swelling, arthralgias and gait problem.  Skin: Denies pallor, rash and wound.  Neurological: Denies dizziness, seizures, syncope, weakness, light-headedness, numbness and headaches.  Hematological: Denies adenopathy. Easy bruising, personal or family bleeding history  Psychiatric/Behavioral: Denies suicidal ideation, mood changes, confusion, nervousness, sleep disturbance and agitation    Physical Exam: Vitals:   03/05/21 0948 03/05/21 1018  BP: (!) 150/80 135/65  Pulse: (!) 55   Temp: 98.4 F (36.9 C)   TempSrc: Oral   SpO2: 95%   Weight: 173 lb 14.4 oz (78.9 kg)     Body mass index is 25.68 kg/m.   Constitutional: NAD, calm, comfortable Eyes: PERRL, lids and conjunctivae normal ENMT: Mucous membranes are moist.  Respiratory: clear to auscultation bilaterally, no wheezing, no crackles. Normal respiratory effort. No accessory muscle use.  Cardiovascular: Regular rate and rhythm, no murmurs / rubs / gallops. No extremity edema.  Psychiatric: Normal judgment and insight. Alert and oriented x 3. Normal mood.    Impression and Plan:   Type 2 diabetes mellitus with other specified complication, without long-term current use of insulin (HCC) -A1c demonstrates excellent control at 6.0.  Chronic  pain syndrome -PDMP reviewed, no red flags, overdose risk score is 0. -Refill oxycodone 10/325 mg to take up to 1 tablet twice daily as needed for total of 60 tablets a month x3 months.  Hyperlipidemia, unspecified hyperlipidemia type -Lipid panel in November 2022 with a total cholesterol of 118, triglycerides 104 and LDL 62.  He is on ezetimibe and rosuvastatin.  Chronic heart failure with preserved ejection fraction (HCC) -Compensated, followed by cardiology.  Time spent: 32 minutes reviewing chart, interviewing and examining patient, formulating plan of care.    [name], MD  Primary Care at [facility]

## 2021-03-10 ENCOUNTER — Telehealth: Payer: Self-pay | Admitting: Pharmacist

## 2021-03-10 NOTE — Chronic Care Management (AMB) (Signed)
° ° °  Chronic Care Management Pharmacy Assistant   Name: [name]  MRN: [id number] DOB: [birth year]  Reason for Encounter: Reschedule appointment with Jeni Salles.  Rescheduled with patients daughter [name] to 07/01/2021  [place] Pharmacist Assistant [phone]

## 2021-03-11 DIAGNOSIS — I70203 Unspecified atherosclerosis of native arteries of extremities, bilateral legs: Secondary | ICD-10-CM | POA: Diagnosis not present

## 2021-03-11 DIAGNOSIS — L84 Corns and callosities: Secondary | ICD-10-CM | POA: Diagnosis not present

## 2021-03-11 DIAGNOSIS — M79676 Pain in unspecified toe(s): Secondary | ICD-10-CM | POA: Diagnosis not present

## 2021-03-11 DIAGNOSIS — B351 Tinea unguium: Secondary | ICD-10-CM | POA: Diagnosis not present

## 2021-03-13 NOTE — Telephone Encounter (Signed)
Okay to hold aspirin and Plavix for 5 days for procedure

## 2021-03-14 NOTE — Telephone Encounter (Signed)
° °  Primary Cardiologist: [name], MD  Chart reviewed as part of pre-operative protocol coverage. Given past medical history and time since last visit, based on ACC/AHA guidelines, MACARIO SHEAR would be at acceptable risk for the planned procedure without further cardiovascular testing.   His RCRI is a class III risk, 6.6% risk of major cardiac event.  He is able to complete greater than 4 METS of physical activity.  His aspirin and Plavix may be held for 5 days prior to his procedure.  Please resume aspirin and Plavix as soon as hemostasis is achieved.  Patient was advised that if he develops new symptoms prior to surgery to contact our office to arrange a follow-up appointment.  He verbalized understanding.  I will route this recommendation to the requesting party via Epic fax function and remove from pre-op pool.  Please call with questions.  Jossie Ng. Chelise Hanger NP-C    03/14/2021, 8:29 AM [place] [address] Office [phone] Fax [phone]

## 2021-04-12 ENCOUNTER — Other Ambulatory Visit: Payer: Self-pay | Admitting: Cardiovascular Disease

## 2021-04-19 ENCOUNTER — Other Ambulatory Visit: Payer: Self-pay | Admitting: Cardiovascular Disease

## 2021-04-19 DIAGNOSIS — R0609 Other forms of dyspnea: Secondary | ICD-10-CM

## 2021-05-10 ENCOUNTER — Other Ambulatory Visit: Payer: Self-pay | Admitting: Cardiovascular Disease

## 2021-05-10 ENCOUNTER — Other Ambulatory Visit: Payer: Self-pay | Admitting: Internal Medicine

## 2021-05-12 ENCOUNTER — Telehealth: Payer: Medicare Other

## 2021-05-19 ENCOUNTER — Encounter (HOSPITAL_BASED_OUTPATIENT_CLINIC_OR_DEPARTMENT_OTHER): Payer: Self-pay | Admitting: Otolaryngology

## 2021-05-19 ENCOUNTER — Other Ambulatory Visit: Payer: Self-pay

## 2021-05-20 ENCOUNTER — Encounter (HOSPITAL_BASED_OUTPATIENT_CLINIC_OR_DEPARTMENT_OTHER)
Admission: RE | Admit: 2021-05-20 | Discharge: 2021-05-20 | Disposition: A | Discharge status: Home / Self Care | Payer: Medicare Other | Source: Ambulatory Visit | Attending: Otolaryngology | Admitting: Otolaryngology

## 2021-05-20 DIAGNOSIS — Z01812 Encounter for preprocedural laboratory examination: Secondary | ICD-10-CM | POA: Insufficient documentation

## 2021-05-20 LAB — BASIC METABOLIC PANEL
Anion gap: 5 (ref 5–15)
BUN: 20 mg/dL (ref 8–23)
CO2: 28 mmol/L (ref 22–32)
Calcium: 8.9 mg/dL (ref 8.9–10.3)
Chloride: 107 mmol/L (ref 98–111)
Creatinine, Ser: 1.47 mg/dL — ABNORMAL HIGH (ref 0.61–1.24)
GFR, Estimated: 48 mL/min — ABNORMAL LOW (ref >60)
Glucose, Bld: 151 mg/dL — ABNORMAL HIGH (ref 70–99)
Potassium: 4.7 mmol/L (ref 3.5–5.1)
Sodium: 140 mmol/L (ref 135–145)

## 2021-05-26 ENCOUNTER — Encounter (HOSPITAL_BASED_OUTPATIENT_CLINIC_OR_DEPARTMENT_OTHER): Payer: Self-pay | Admitting: Otolaryngology

## 2021-05-26 ENCOUNTER — Other Ambulatory Visit: Payer: Self-pay

## 2021-05-26 ENCOUNTER — Ambulatory Visit (HOSPITAL_BASED_OUTPATIENT_CLINIC_OR_DEPARTMENT_OTHER): Payer: Medicare Other | Admitting: Anesthesiology

## 2021-05-26 ENCOUNTER — Ambulatory Visit (HOSPITAL_BASED_OUTPATIENT_CLINIC_OR_DEPARTMENT_OTHER)
Admission: RE | Admit: 2021-05-26 | Discharge: 2021-05-26 | Disposition: A | Discharge status: Home / Self Care | Payer: Medicare Other | Attending: Otolaryngology | Admitting: Otolaryngology

## 2021-05-26 ENCOUNTER — Encounter (HOSPITAL_BASED_OUTPATIENT_CLINIC_OR_DEPARTMENT_OTHER)
Admission: RE | Disposition: A | Discharge status: Home / Self Care | Payer: Self-pay | Source: Home / Self Care | Attending: Otolaryngology

## 2021-05-26 DIAGNOSIS — J3489 Other specified disorders of nose and nasal sinuses: Secondary | ICD-10-CM | POA: Insufficient documentation

## 2021-05-26 DIAGNOSIS — Z7902 Long term (current) use of antithrombotics/antiplatelets: Secondary | ICD-10-CM | POA: Diagnosis not present

## 2021-05-26 DIAGNOSIS — I110 Hypertensive heart disease with heart failure: Secondary | ICD-10-CM | POA: Diagnosis not present

## 2021-05-26 DIAGNOSIS — E1122 Type 2 diabetes mellitus with diabetic chronic kidney disease: Secondary | ICD-10-CM | POA: Insufficient documentation

## 2021-05-26 DIAGNOSIS — I2510 Atherosclerotic heart disease of native coronary artery without angina pectoris: Secondary | ICD-10-CM

## 2021-05-26 DIAGNOSIS — I130 Hypertensive heart and chronic kidney disease with heart failure and stage 1 through stage 4 chronic kidney disease, or unspecified chronic kidney disease: Secondary | ICD-10-CM | POA: Diagnosis not present

## 2021-05-26 DIAGNOSIS — N189 Chronic kidney disease, unspecified: Secondary | ICD-10-CM | POA: Insufficient documentation

## 2021-05-26 DIAGNOSIS — J342 Deviated nasal septum: Secondary | ICD-10-CM

## 2021-05-26 DIAGNOSIS — I509 Heart failure, unspecified: Secondary | ICD-10-CM | POA: Diagnosis not present

## 2021-05-26 DIAGNOSIS — Z87891 Personal history of nicotine dependence: Secondary | ICD-10-CM | POA: Diagnosis not present

## 2021-05-26 DIAGNOSIS — K219 Gastro-esophageal reflux disease without esophagitis: Secondary | ICD-10-CM | POA: Insufficient documentation

## 2021-05-26 DIAGNOSIS — Z951 Presence of aortocoronary bypass graft: Secondary | ICD-10-CM | POA: Insufficient documentation

## 2021-05-26 DIAGNOSIS — I252 Old myocardial infarction: Secondary | ICD-10-CM | POA: Diagnosis not present

## 2021-05-26 DIAGNOSIS — G4730 Sleep apnea, unspecified: Secondary | ICD-10-CM | POA: Diagnosis not present

## 2021-05-26 DIAGNOSIS — J310 Chronic rhinitis: Secondary | ICD-10-CM | POA: Diagnosis not present

## 2021-05-26 DIAGNOSIS — I10 Essential (primary) hypertension: Secondary | ICD-10-CM

## 2021-05-26 DIAGNOSIS — Z955 Presence of coronary angioplasty implant and graft: Secondary | ICD-10-CM | POA: Insufficient documentation

## 2021-05-26 HISTORY — DX: Type 2 diabetes mellitus without complications: E11.9

## 2021-05-26 HISTORY — PX: SEPTOPLASTY: SHX2393

## 2021-05-26 SURGERY — SEPTOPLASTY, NOSE
Anesthesia: General | Site: Nose | Laterality: Bilateral

## 2021-05-26 MED ORDER — OXYCODONE HCL 5 MG/5ML PO SOLN: 5.0000 mg | Freq: Once | ORAL | Status: DC | PRN

## 2021-05-26 MED ORDER — SUGAMMADEX SODIUM 200 MG/2ML IV SOLN
INTRAVENOUS | Status: DC | PRN
  Administered 2021-05-26: 200 mg via INTRAVENOUS

## 2021-05-26 MED ORDER — ACETAMINOPHEN 500 MG PO TABS
1000.0000 mg | ORAL_TABLET | Freq: Once | ORAL | Status: AC
  Administered 2021-05-26: 1000 mg via ORAL

## 2021-05-26 MED ORDER — ACETAMINOPHEN 500 MG PO TABS
ORAL_TABLET | ORAL | Status: AC
  Filled 2021-05-26: qty 2

## 2021-05-26 MED ORDER — ROCURONIUM BROMIDE 10 MG/ML (PF) SYRINGE
PREFILLED_SYRINGE | INTRAVENOUS | Status: AC
  Filled 2021-05-26: qty 10

## 2021-05-26 MED ORDER — PROPOFOL 10 MG/ML IV BOLUS
INTRAVENOUS | Status: DC | PRN
  Administered 2021-05-26: 100 mg via INTRAVENOUS

## 2021-05-26 MED ORDER — FENTANYL CITRATE (PF) 100 MCG/2ML IJ SOLN
INTRAMUSCULAR | Status: AC
  Filled 2021-05-26: qty 2

## 2021-05-26 MED ORDER — CEFAZOLIN SODIUM-DEXTROSE 1-4 GM/50ML-% IV SOLN
INTRAVENOUS | Status: DC | PRN
  Administered 2021-05-26: 2 g via INTRAVENOUS

## 2021-05-26 MED ORDER — LIDOCAINE HCL (CARDIAC) PF 100 MG/5ML IV SOSY
PREFILLED_SYRINGE | INTRAVENOUS | Status: DC | PRN
  Administered 2021-05-26: 50 mg via INTRAVENOUS

## 2021-05-26 MED ORDER — FENTANYL CITRATE (PF) 100 MCG/2ML IJ SOLN
INTRAMUSCULAR | Status: DC | PRN
Start: 2021-05-26 — End: 2021-05-26
  Administered 2021-05-26: 50 ug via INTRAVENOUS

## 2021-05-26 MED ORDER — LIDOCAINE-EPINEPHRINE 1 %-1:100000 IJ SOLN
INTRAMUSCULAR | Status: DC | PRN
  Administered 2021-05-26: 4 mL via INTRADERMAL

## 2021-05-26 MED ORDER — LACTATED RINGERS IV SOLN: INTRAVENOUS | Status: DC

## 2021-05-26 MED ORDER — PROPOFOL 10 MG/ML IV BOLUS
INTRAVENOUS | Status: AC
  Filled 2021-05-26: qty 20

## 2021-05-26 MED ORDER — OXYMETAZOLINE HCL 0.05 % NA SOLN
NASAL | Status: DC | PRN
  Administered 2021-05-26: 1 via TOPICAL

## 2021-05-26 MED ORDER — DEXAMETHASONE SODIUM PHOSPHATE 4 MG/ML IJ SOLN
INTRAMUSCULAR | Status: DC | PRN
Start: 2021-05-26 — End: 2021-05-26
  Administered 2021-05-26: 10 mg via INTRAVENOUS

## 2021-05-26 MED ORDER — EPHEDRINE SULFATE (PRESSORS) 50 MG/ML IJ SOLN
INTRAMUSCULAR | Status: DC | PRN
  Administered 2021-05-26: 10 mg via INTRAVENOUS
  Administered 2021-05-26: 5 mg via INTRAVENOUS

## 2021-05-26 MED ORDER — ROCURONIUM BROMIDE 100 MG/10ML IV SOLN
INTRAVENOUS | Status: DC | PRN
  Administered 2021-05-26: 70 mg via INTRAVENOUS

## 2021-05-26 MED ORDER — 0.9 % SODIUM CHLORIDE (POUR BTL) OPTIME
TOPICAL | Status: DC | PRN
  Administered 2021-05-26: 120 mL

## 2021-05-26 MED ORDER — AMOXICILLIN 875 MG PO TABS: 875.0000 mg | ORAL_TABLET | Freq: Two times a day (BID) | ORAL | 0 refills | Status: AC

## 2021-05-26 MED ORDER — MUPIROCIN 2 % EX OINT
TOPICAL_OINTMENT | CUTANEOUS | Status: DC | PRN
  Administered 2021-05-26: 1 via TOPICAL

## 2021-05-26 MED ORDER — OXYMETAZOLINE HCL 0.05 % NA SOLN
NASAL | Status: AC
  Filled 2021-05-26: qty 60

## 2021-05-26 MED ORDER — OXYCODONE HCL 5 MG PO TABS: 5.0000 mg | ORAL_TABLET | Freq: Once | ORAL | Status: DC | PRN

## 2021-05-26 MED ORDER — ONDANSETRON HCL 4 MG/2ML IJ SOLN
INTRAMUSCULAR | Status: DC | PRN
  Administered 2021-05-26: 4 mg via INTRAVENOUS

## 2021-05-26 MED ORDER — ONDANSETRON HCL 4 MG/2ML IJ SOLN: 4.0000 mg | Freq: Once | INTRAMUSCULAR | Status: DC | PRN

## 2021-05-26 MED ORDER — FENTANYL CITRATE (PF) 100 MCG/2ML IJ SOLN: 25.0000 ug | INTRAMUSCULAR | Status: DC | PRN

## 2021-05-26 MED ORDER — ONDANSETRON HCL 4 MG/2ML IJ SOLN
INTRAMUSCULAR | Status: AC
  Filled 2021-05-26: qty 2

## 2021-05-26 MED ORDER — AMISULPRIDE (ANTIEMETIC) 5 MG/2ML IV SOLN: 10.0000 mg | Freq: Once | INTRAVENOUS | Status: DC | PRN

## 2021-05-26 MED ORDER — DEXAMETHASONE SODIUM PHOSPHATE 10 MG/ML IJ SOLN
INTRAMUSCULAR | Status: AC
  Filled 2021-05-26: qty 1

## 2021-05-26 SURGICAL SUPPLY — 23 items
BLADE SURG 15 STRL LF DISP TIS (BLADE) IMPLANT
BLADE SURG 15 STRL SS (BLADE) ×2
CANISTER SUCT 1200ML W/VALVE (MISCELLANEOUS) ×2 IMPLANT
COAGULATOR SUCT 8FR VV (MISCELLANEOUS) ×2 IMPLANT
ELECT REM PT RETURN 9FT ADLT (ELECTROSURGICAL) ×2
ELECTRODE REM PT RTRN 9FT ADLT (ELECTROSURGICAL) ×1 IMPLANT
GLOVE BIO SURGEON STRL SZ7.5 (GLOVE) ×2 IMPLANT
GOWN STRL REUS W/ TWL LRG LVL3 (GOWN DISPOSABLE) ×2 IMPLANT
GOWN STRL REUS W/TWL LRG LVL3 (GOWN DISPOSABLE) ×4
NDL HYPO 25X1 1.5 SAFETY (NEEDLE) ×1 IMPLANT
NEEDLE HYPO 25X1 1.5 SAFETY (NEEDLE) ×2 IMPLANT
NS IRRIG 1000ML POUR BTL (IV SOLUTION) ×1 IMPLANT
PACK BASIN DAY SURGERY FS (CUSTOM PROCEDURE TRAY) ×2 IMPLANT
PACK ENT DAY SURGERY (CUSTOM PROCEDURE TRAY) ×2 IMPLANT
SLEEVE SCD COMPRESS KNEE MED (STOCKING) ×2 IMPLANT
SPLINT NASAL AIRWAY SILICONE (MISCELLANEOUS) ×2 IMPLANT
SPONGE GAUZE 2X2 8PLY STRL LF (GAUZE/BANDAGES/DRESSINGS) ×2 IMPLANT
SPONGE NEURO XRAY DETECT 1X3 (DISPOSABLE) ×2 IMPLANT
SUT CHROMIC 4 0 P 3 18 (SUTURE) ×2 IMPLANT
SUT PLAIN 4 0 ~~LOC~~ 1 (SUTURE) ×2 IMPLANT
SUT PROLENE 3 0 PS 2 (SUTURE) ×2 IMPLANT
TOWEL GREEN STERILE FF (TOWEL DISPOSABLE) ×2 IMPLANT
TUBE SALEM SUMP 16 FR W/ARV (TUBING) ×1 IMPLANT

## 2021-05-26 NOTE — Discharge Instructions (Addendum)
POSTOPERATIVE INSTRUCTIONS FOR PATIENTS HAVING NASAL OR SINUS OPERATIONS ?ACTIVITY: Restrict activity at home for the first two days, resting as much as possible. Light activity is best. You may usually return to work within a week. You should refrain from nose blowing, strenuous activity, or heavy lifting greater than 20lbs for a total of one week after your operation.  If sneezing cannot be avoided, sneeze with your mouth open. ?DISCOMFORT: You may experience a dull headache and pressure along with nasal congestion and discharge. These symptoms may be worse during the first week after the operation but may last as long as two to four weeks.  Please take Tylenol or the pain medication that has been prescribed for you. Do not take aspirin or aspirin containing medications since they may cause bleeding.  You may experience symptoms of post nasal drainage, nasal congestion, headaches and fatigue for two or three months after your operation.  ?BLEEDING: You may have some blood tinged nasal drainage for approximately two weeks after the operation.  The discharge will be worse for the first week.  Please call our office at [phone] or go to the nearest hospital emergency room if you experience any of the following: heavy, bright red blood from your nose or mouth that lasts longer than 15 minutes or coughing up or vomiting bright red blood or blood clots. ?GENERAL CONSIDERATIONS: ?A gauze dressing will be placed on your upper lip to absorb any drainage after the operation. You may need to change this several times a day.  If you do not have very much drainage, you may remove the dressing.  Remember that you may gently wipe your nose with a tissue and sniff in, but DO NOT blow your nose. ?Please keep all of your postoperative appointments.  Your final results after the operation will depend on proper follow-up.  The initial visit is usually 2 to 5 days after the operation.  During this visit, the remaining nasal  packing and internal septal splints will be removed.  Your nasal and sinus cavities will be cleaned.  During the second visit, your nasal and sinus cavities will be cleaned again. Have someone drive you to your first two postoperative appointments.  ?How you care for your nose after the operation will influence the results that you obtain.  You should follow all directions, take your medication as prescribed, and call our office [phone] with any problems or questions. ?You may be more comfortable sleeping with your head elevated on two pillows. ?Do not take any medications that we have not prescribed or recommended. ?WARNING SIGNS: if any of the following should occur, please call our office: ?Persistent fever greater than 102F. ?Persistent vomiting. ?Severe and constant pain that is not relieved by prescribed pain medication. ?Trauma to the nose. ?Rash or unusual side effects from any medicines.  ? ?You may have Tylenol again after 3:30pm today, if needed. ? ? ?Post Anesthesia Home Care Instructions ? ?Activity: ?Get plenty of rest for the remainder of the day. A responsible individual must stay with you for 24 hours following the procedure.  ?For the next 24 hours, DO NOT: ?-Drive a car ?-Paediatric nurse ?-Drink alcoholic beverages ?-Take any medication unless instructed by your physician ?-Make any legal decisions or sign important papers. ? ?Meals: ?Start with liquid foods such as gelatin or soup. Progress to regular foods as tolerated. Avoid greasy, spicy, heavy foods. If nausea and/or vomiting occur, drink only clear liquids until the nausea and/or vomiting subsides. Call your physician  if vomiting continues. ? ?Special Instructions/Symptoms: ?Your throat may feel dry or sore from the anesthesia or the breathing tube placed in your throat during surgery. If this causes discomfort, gargle with warm salt water. The discomfort should disappear within 24 hours. ? ?If you had a scopolamine patch placed  behind your ear for the management of post- operative nausea and/or vomiting: ? ?1. The medication in the patch is effective for 72 hours, after which it should be removed.  Wrap patch in a tissue and discard in the trash. Wash hands thoroughly with soap and water. ?2. You may remove the patch earlier than 72 hours if you experience unpleasant side effects which may include dry mouth, dizziness or visual disturbances. ?3. Avoid touching the patch. Wash your hands with soap and water after contact with the patch. ?    ?

## 2021-05-26 NOTE — Transfer of Care (Signed)
Immediate Anesthesia Transfer of Care Note ? ?Patient: [name] ? ?Procedure(s) Performed: SEPTOPLASTY (Bilateral: Nose) ? ?Patient Location: PACU ? ?Anesthesia Type:General ? ?Level of Consciousness: awake, alert  and oriented ? ?Airway & Oxygen Therapy: Patient Spontanous Breathing and Patient connected to face mask oxygen ? ?Post-op Assessment: Report given to RN and Post -op Vital signs reviewed and stable ? ?Post vital signs: Reviewed and stable ? ?Last Vitals:  ?Vitals Value Taken Time  ?BP    ?Temp    ?Pulse    ?Resp    ?SpO2    ? ? ?Last Pain:  ?Vitals:  ? 05/26/21 0901  ?TempSrc: Oral  ?PainSc: 5   ?   ? ?Patients Stated Pain Goal: 7 (05/26/21 0901) ? ?Complications: No notable events documented. ?

## 2021-05-26 NOTE — Anesthesia Procedure Notes (Signed)
Procedure Name: Intubation ?Date/Time: 05/26/2021 10:30 AM ?Performed by: Verita Lamb, CRNA ?Pre-anesthesia Checklist: Patient identified, Emergency Drugs available, Suction available and Patient being monitored ?Patient Re-evaluated:Patient Re-evaluated prior to induction ?Oxygen Delivery Method: Circle system utilized ?Preoxygenation: Pre-oxygenation with 100% oxygen ?Induction Type: IV induction ?Ventilation: Mask ventilation without difficulty ?Laryngoscope Size: Mac and 4 ?Grade View: Grade I ?Tube type: Oral ?Tube size: 7.5 mm ?Number of attempts: 1 ?Airway Equipment and Method: Stylet and Oral airway ?Placement Confirmation: ETT inserted through vocal cords under direct vision, positive ETCO2 and breath sounds checked- equal and bilateral ?Tube secured with: Tape ?Dental Injury: Teeth and Oropharynx as per pre-operative assessment  ? ? ? ? ?

## 2021-05-26 NOTE — Anesthesia Postprocedure Evaluation (Signed)
Anesthesia Post Note ? ?Patient: [name] ? ?Procedure(s) Performed: SEPTOPLASTY (Bilateral: Nose) ? ?  ? ?Patient location during evaluation: PACU ?Anesthesia Type: General ?Level of consciousness: awake and alert ?Pain management: pain level controlled ?Vital Signs Assessment: post-procedure vital signs reviewed and stable ?Respiratory status: spontaneous breathing, nonlabored ventilation and respiratory function stable ?Cardiovascular status: blood pressure returned to baseline and stable ?Postop Assessment: no apparent nausea or vomiting ?Anesthetic complications: no ? ? ?No notable events documented. ? ?Last Vitals:  ?Vitals:  ? 05/26/21 1130 05/26/21 1213  ?BP: (!) 121/58 116/61  ?Pulse: 60 64  ?Resp: 14 16  ?Temp:  36.6 ?C  ?SpO2: 96% 94%  ?  ?Last Pain:  ?Vitals:  ? 05/26/21 1213  ?TempSrc:   ?PainSc: 0-No pain  ? ? ?  ?  ?  ?  ?  ?  ? ?Lidia Collum ? ? ? ? ?

## 2021-05-26 NOTE — H&P (Signed)
Cc: Chronic nasal obstruction ? ?HPI: ?The patient is a 81 year old male who presents today complaining of chronic nasal obstruction and postnasal drainage.  According to the patient, he has been having difficulty breathing through his nose his entire life. He previously underwent sinonasal surgery 20 years ago.  However, he continues to be symptomatic.  The patient has been using Mucinex and Zyrtec daily.  He also uses Flonase frequently.  He is a habitual mouth breather.  He has a history of sleep apnea.  However, he could not tolerate the use of CPAP machine secondary to his nasal obstruction. The patient has a history of coronary artery disease.  He is currently on Plavix.  Currently he denies any facial pain, fever, purulent drainage, or visual change.  ? ?The patient's review of systems (constitutional, eyes, ENT, cardiovascular, respiratory, GI, musculoskeletal, skin, neurologic, psychiatric, endocrine, hematologic, allergic) is noted in the ROS questionnaire.  It is reviewed with the patient. ? ?Major events: Heart attack, bypass/ stents, back surgery, gallbladder removed.  ?Ongoing medical problems: Heart attack, reflux, arthritis, headaches, depression, anxiety disorder.  ?Family health history: Heart disease.  ?Social history: The patient is a widower. He denies the use of tobacco, alcohol or illegal drugs.   ? ?Exam: ?General: Communicates without difficulty, well nourished, no acute distress. Head: Normocephalic, no evidence injury, no tenderness, facial buttresses intact without stepoff. Face/sinus: No tenderness to palpation and percussion. Facial movement is normal and symmetric. Eyes: PERRL, EOMI. No scleral icterus, conjunctivae clear. Neuro: CN II exam reveals vision grossly intact.  No nystagmus at any point of gaze. Ears: Auricles well formed without lesions.  Ear canals are intact without mass or lesion.  No erythema or edema is appreciated.  The TMs are intact without fluid. Nose: External  evaluation reveals normal support and skin without lesions.  Dorsum is intact.  Anterior rhinoscopy reveals congested mucosa over anterior aspect of inferior turbinates and intact septum.  No purulence noted. Oral:  Oral cavity and oropharynx are intact, symmetric, without erythema or edema.  Mucosa is moist without lesions. Neck: Full range of motion without pain.  There is no significant lymphadenopathy.  No masses palpable.  Thyroid bed within normal limits to palpation.  Parotid glands and submandibular glands equal bilaterally without mass.  Trachea is midline. Neuro:  CN 2-12 grossly intact. Gait normal. A flexible scope was inserted into the right nasal cavity.  Endoscopy of the interior nasal cavity, superior, inferior, and middle meatus was performed. The sphenoid-ethmoid recess was examined. Edematous mucosa was noted.  No polyp, mass, or lesion was appreciated.  Severe nasal septal deviation noted. Olfactory cleft was clear.  Nasopharynx with postnasal drainage.  Turbinates were previously reduced.  The procedure was repeated on the contralateral side with similar findings.  ? ?Assessment  ?1.  Chronic rhinitis with nasal mucosal congestion and severe leftward septal deviation.  His inferior turbinates were previously reduced.  No polyps, mass, or lesions are noted.  ?2.  Chronic postnasal drainage.  ?3.  The rest of his ENT exam is unremarkable.  ? ?Plan  ?1.  The physical exam and nasal endoscopy findings are reviewed with the patient.  ?2.  The patient should continue with the use of Flonase, Mucinex, and Zyrtec.  ?3.  In light of his severe nasal septal deviation, he may benefit from undergoing septoplasty to improve his nasal passageways.  The risks, benefits, alternatives and details of the procedure are reviewed with the patient.  Questions are invited and answered.  ?  4.  The patient would like to proceed with the septoplasty procedure.  ? ?

## 2021-05-26 NOTE — Anesthesia Preprocedure Evaluation (Signed)
Anesthesia Evaluation  ?Patient identified by MRN, date of birth, ID band ?Patient awake ? ? ? ?Reviewed: ?Allergy & Precautions, NPO status , Patient's Chart, lab work & pertinent test results ? ?Airway ?Mallampati: II ? ?TM Distance: >3 FB ?Neck ROM: Full ? ? ? Dental ? ?(+) Edentulous Upper, Missing, Chipped, Dental Advisory Given,  ?  ?Pulmonary ?sleep apnea , COPD, former smoker,  ?  ?Pulmonary exam normal ?breath sounds clear to auscultation ? ? ? ? ? ? Cardiovascular ?hypertension, Pt. on medications ?pulmonary hypertension+ CAD, + Past MI, + Cardiac Stents, + CABG, +CHF and + DOE  ?Normal cardiovascular exam ?Rhythm:Regular Rate:Normal ? ?TTE 2021 ??1. Left ventricular ejection fraction, by estimation, is 50 to 55%. The  ?left ventricle has low normal function. The left ventricle has no regional  ?wall motion abnormalities. Left ventricular diastolic parameters are  ?consistent with Grade II diastolic  ?dysfunction (pseudonormalization).  ??2. Right ventricular systolic function is normal. The right ventricular  ?size is normal. There is moderately elevated pulmonary artery systolic  ?pressure.  ??3. Left atrial size was moderately dilated.  ??4. The mitral valve is normal in structure. Trivial mitral valve  ?regurgitation. No evidence of mitral stenosis.  ??5. The aortic valve is grossly normal. Aortic valve regurgitation is  ?trivial. No aortic stenosis is present. ? ?[place] 2020 ?Conclusions: ?1. Severe native coronary artery disease, including 75% ostial LMCA stenosis, 50% mid LAD disease, sequential 90% and 70% proximal/mid LCx stenoses as well as 70% mid OM1 lesion, and sequential 70% ostial, 80% proximal, and 95% distal RCA stenoses. ?2. Widely patent LIMA-LAD. ?3. Chronically occluded SVG-OM1. ?4. Subtotally occluded SVG-rPDA-rPL with 99% mid graft stenosis followed by extensive thrombus, some of which has embolized into the distal branches. ?5. Upper normal left  ventricular filling pressure. ?6. Successful PCI to SVG-rPDA-rPL using overlapping Resolute Onyx 3.0 x 18 mm, 3.0 x 38 mm, and 3.0 x 38 mm drug-eluting stents with 0% residual stenosis and TIMI-3 flow.  Small amount of nonocclusive thrombus remained in the rPL branch at the end of the procedure. ?? ?Recommendations: ?1.Indefinite dual antiplatelet therapy with aspirin and clopidogrel, as tolerated. ?2.Medical therapy of protected LMCA and LCx disease.  If the patient has refractory symptoms, complex PCI of the ostial LMCA, LCx, and OM1 would need to be considered. ?3.Aggressive secondary prevention. ?4.Gentle post-catheterization hydration with close monitoring of renal function and hemoglobin, given contrast exposure and glycoprotein IIb/IIIa use during the intervention. ? ? ?  ?Neuro/Psych ?PSYCHIATRIC DISORDERS Depression negative neurological ROS ?   ? GI/Hepatic ?Neg liver ROS, hiatal hernia, GERD  Medicated and Controlled,  ?Endo/Other  ?diabetesHypothyroidism  ? Renal/GU ?Renal Insufficiency and CRFRenal disease  ?negative genitourinary ?  ?Musculoskeletal ? ? Abdominal ?  ?Peds ? Hematology ? ?(+) Blood dyscrasia (on plavix), ,   ?Anesthesia Other Findings ? ? Reproductive/Obstetrics ? ?  ? ? ? ? ? ? ? ? ? ? ? ? ? ?  ?  ? ? ? ? ? ? ? ? ?Anesthesia Physical ?Anesthesia Plan ? ?ASA: 3 ? ?Anesthesia Plan: General  ? ?Post-op Pain Management:   ? ?Induction: Intravenous ? ?PONV Risk Score and Plan: Ondansetron, Dexamethasone, Treatment may vary due to age or medical condition and Midazolam ? ?Airway Management Planned: Oral ETT ? ?Additional Equipment: None ? ?Intra-op Plan:  ? ?Post-operative Plan: Extubation in OR ? ?Informed Consent:  ? ?Plan Discussed with:  ? ?Anesthesia Plan Comments:   ? ? ? ? ? ? ?Anesthesia Quick Evaluation ? ?

## 2021-05-26 NOTE — Op Note (Signed)
DATE OF PROCEDURE: 05/26/2021 ? ?OPERATIVE REPORT  ? ?SURGEON: Leta Baptist, MD  ? ?PREOPERATIVE DIAGNOSES:  ?1. Severe nasal septal deviation.  ?2. Chronic nasal obstruction. ? ?POSTOPERATIVE DIAGNOSES:  ?1. Severe nasal septal deviation.  ?2. Chronic nasal obstruction. ? ?PROCEDURE PERFORMED:  ?1. Septoplasty.  ?2. Bilateral partial inferior turbinate resection.  ? ?ANESTHESIA: General endotracheal tube anesthesia.  ? ?COMPLICATIONS: None.  ? ?ESTIMATED BLOOD LOSS: 50 mL.  ? ?INDICATION FOR PROCEDURE: [name] is a 81 y.o. male with a history of chronic nasal obstruction. The patient was treated with antihistamine, decongestant, and steroid nasal sprays. However, the patient continued to be symptomatic. On examination, the patient was noted to have severe nasal septal deviation, causing significant nasal obstruction. Based on the above findings, the decision was made for the patient to undergo the above-stated procedures. The risks, benefits, alternatives, and details of the procedures were discussed with the patient. Questions were invited and answered. Informed consent was obtained.  ? ?DESCRIPTION OF PROCEDURE: The patient was taken to the operating room and placed supine on the operating table. General endotracheal tube anesthesia was administered by the anesthesiologist. The patient was positioned, and prepped and draped in the standard fashion for nasal surgery. Pledgets soaked with Afrin were placed in both nasal cavities for decongestion. The pledgets were subsequently removed.  ? ?Examination of the nasal cavity revealed a severe nasal septal deviation. 1% lidocaine with 1:100,000 epinephrine was injected onto the nasal septum bilaterally. A hemitransfixion incision was made on the left side. The mucosal flap was carefully elevated on the left side. A cartilaginous incision was made 1 cm superior to the caudal margin of the nasal septum. Mucosal flap was also elevated on the right side in the similar  fashion. It should be noted that due to the severe septal deviation, the deviated portion of the cartilaginous and bony septum had to be removed in piecemeal fashion. Once the deviated portions were removed, a straight midline septum was achieved. The septum was then quilted with 4-0 plain gut sutures. The hemitransfixion incision was closed with interrupted 4-0 chromic sutures.  Doyle splints were applied to the nasal septum. ? ?The care of the patient was turned over to the anesthesiologist. The patient was awakened from anesthesia without difficulty. The patient was extubated and transferred to the recovery room in good condition.  ? ?OPERATIVE FINDINGS: Severe nasal septal deviation. ? ?SPECIMEN: None.  ? ?FOLLOWUP CARE: The patient be discharged home once he is awake and alert. The patient will be placed on amoxicillin 875 mg p.o. b.i.d. for 3 days. The patient will follow up in my office in 3 days for splint removal.  ? ?Myliah Medel Raynelle Bring, MD  ?

## 2021-05-27 ENCOUNTER — Encounter (HOSPITAL_BASED_OUTPATIENT_CLINIC_OR_DEPARTMENT_OTHER): Payer: Self-pay | Admitting: Otolaryngology

## 2021-06-02 ENCOUNTER — Ambulatory Visit (INDEPENDENT_AMBULATORY_CARE_PROVIDER_SITE_OTHER): Payer: Medicare Other | Admitting: Internal Medicine

## 2021-06-02 ENCOUNTER — Encounter: Payer: Self-pay | Admitting: Internal Medicine

## 2021-06-02 VITALS — BP 130/70 | HR 60 | Temp 98.2°F | Wt 173.1 lb

## 2021-06-02 DIAGNOSIS — G894 Chronic pain syndrome: Secondary | ICD-10-CM | POA: Diagnosis not present

## 2021-06-02 DIAGNOSIS — E1169 Type 2 diabetes mellitus with other specified complication: Secondary | ICD-10-CM

## 2021-06-02 LAB — POCT GLYCOSYLATED HEMOGLOBIN (HGB A1C): Hemoglobin A1C: 5.9 % — AB (ref 4.0–5.6)

## 2021-06-02 MED ORDER — OXYCODONE-ACETAMINOPHEN 10-325 MG PO TABS: 1.0000 | ORAL_TABLET | Freq: Two times a day (BID) | ORAL | 0 refills | Status: DC

## 2021-06-02 MED ORDER — OXYCODONE-ACETAMINOPHEN 10-325 MG PO TABS: 1.0000 | ORAL_TABLET | Freq: Two times a day (BID) | ORAL | 0 refills | Status: DC | PRN

## 2021-06-02 NOTE — Patient Instructions (Signed)
-  Nice seeing you today!! ? ?-Schedule follow up in 3 months or sooner as needed. ? ?

## 2021-06-02 NOTE — Progress Notes (Signed)
? ? ? ?Established Patient Office Visit ? ? ? ? ?CC/Reason for Visit: Follow-up chronic conditions, medication ? ?HPI: [name] is a 81 y.o. male who is coming in today for the above mentioned reasons. Past Medical History is significant for: Type 2 diabetes that has been well controlled, heart failure with preserved ejection fraction, chronic pain syndrome with multiple joint pain including neck, shoulders, hands, hips, knees.  He has a history of ascending cholangitis and pancreatitis due to biliary strictures, choledocholithiasis followed by Dr. Rush Landmark.  He is on a chronic pain contract with me for oxycodone for which he takes half a tablet 3 times a day.  His main complaint continues to be diffuse joint achiness.  He has seen a rheumatologist and was told "everything was normal".  I do not have reports of this visit.  He is due for pain refills per contract. ? ? ?Past Medical/Surgical History: ?Past Medical History:  ?Diagnosis Date  ? Allergy   ? B12 deficiency anemia   ? Blood transfusion without reported diagnosis   ? CAD (coronary artery disease)   ? Cancer [facility])   ? bladder-   ? Colon polyps   ? COPD (chronic obstructive pulmonary disease) ([place])   ? Depression   ? Diabetes mellitus ([place])   ? Esophagus, Barrett's   ? GERD (gastroesophageal reflux disease)   ? History of bladder cancer   ? Bladder cancer "8 times"  ? History of hiatal hernia   ? Hyperlipidemia   ? Hypertension   ? Localized osteoarthrosis, lower leg   ? Myocardial infarction [facility]) 2021  ? Restless leg syndrome   ? Sleep apnea   ? does not wear cpap  ? Stenosis of esophagus   ? ? ?Past Surgical History:  ?Procedure Laterality Date  ? BILIARY BRUSHING  04/01/2018  ? Procedure: BILIARY BRUSHING;  Surgeon: Rush Landmark Telford Nab., MD;  Location: [place];  Service: Gastroenterology;;  ? BILIARY BRUSHING  09/12/2018  ? Procedure: BILIARY BRUSHING;  Surgeon: Rush Landmark Telford Nab., MD;  Location: [place];  Service:  Gastroenterology;;  ? BILIARY BRUSHING  11/28/2018  ? Procedure: BILIARY BRUSHING;  Surgeon: Rush Landmark Telford Nab., MD;  Location: [place];  Service: Gastroenterology;;  ? BILIARY BRUSHING  03/11/2020  ? Procedure: BILIARY BRUSHING;  Surgeon: Rush Landmark Telford Nab., MD;  Location: [facility];  Service: Gastroenterology;;  ? BILIARY DILATION  09/12/2018  ? Procedure: BILIARY DILATION;  Surgeon: Rush Landmark Telford Nab., MD;  Location: [place];  Service: Gastroenterology;;  ? BILIARY DILATION  11/28/2018  ? Procedure: BILIARY DILATION;  Surgeon: Rush Landmark Telford Nab., MD;  Location: [place];  Service: Gastroenterology;;  ? BILIARY DILATION  03/11/2020  ? Procedure: BILIARY DILATION;  Surgeon: Rush Landmark Telford Nab., MD;  Location: [facility];  Service: Gastroenterology;;  ? BILIARY STENT PLACEMENT  04/01/2018  ? Procedure: BILIARY STENT PLACEMENT;  Surgeon: Rush Landmark Telford Nab., MD;  Location: [place];  Service: Gastroenterology;;  ? BILIARY STENT PLACEMENT  09/12/2018  ? Procedure: BILIARY STENT PLACEMENT;  Surgeon: Rush Landmark Telford Nab., MD;  Location: [place];  Service: Gastroenterology;;  ? BILIARY STENT PLACEMENT  11/28/2018  ? Procedure: BILIARY STENT PLACEMENT;  Surgeon: Irving Copas., MD;  Location: [place];  Service: Gastroenterology;;  ? BIOPSY  04/01/2018  ? Procedure: BIOPSY;  Surgeon: Irving Copas., MD;  Location: [place];  Service: Gastroenterology;;  ? BIOPSY  09/12/2018  ? Procedure: BIOPSY;  Surgeon: Irving Copas., MD;  Location: [place];  Service: Gastroenterology;;  ?  BIOPSY  11/28/2018  ? Procedure: BIOPSY;  Surgeon: Irving Copas., MD;  Location: [place];  Service: Gastroenterology;;  ? BIOPSY  03/11/2020  ? Procedure: BIOPSY;  Surgeon: Irving Copas., MD;  Location: [facility];  Service: Gastroenterology;;  ? bladder cancer    ?  x 8 cystoscopy  ? CERVICAL DISCECTOMY    ? ACDF  ? COLONOSCOPY   11/17/2005  ? normal   ? CORONARY ARTERY BYPASS GRAFT    ? x4  ? CORONARY STENT INTERVENTION N/A 01/05/2019  ? Procedure: CORONARY STENT INTERVENTION;  Surgeon: Nelva Bush, MD;  Location: [place] [facility];  Service: Cardiovascular;  Laterality: N/A;  ? ENDOSCOPIC MUCOSAL RESECTION  09/12/2018  ? Procedure: ENDOSCOPIC MUCOSAL RESECTION;  Surgeon: Rush Landmark Telford Nab., MD;  Location: [place];  Service: Gastroenterology;;  ? ENDOSCOPIC RETROGRADE CHOLANGIOPANCREATOGRAPHY (ERCP) WITH PROPOFOL N/A 04/01/2018  ? Procedure: ENDOSCOPIC RETROGRADE CHOLANGIOPANCREATOGRAPHY (ERCP) WITH PROPOFOL;  Surgeon: Rush Landmark Telford Nab., MD;  Location: [place];  Service: Gastroenterology;  Laterality: N/A;  ? ENDOSCOPIC RETROGRADE CHOLANGIOPANCREATOGRAPHY (ERCP) WITH PROPOFOL N/A 09/12/2018  ? Procedure: ENDOSCOPIC RETROGRADE CHOLANGIOPANCREATOGRAPHY (ERCP) WITH PROPOFOL;  Surgeon: Rush Landmark Telford Nab., MD;  Location: [place];  Service: Gastroenterology;  Laterality: N/A;  ? ENDOSCOPIC RETROGRADE CHOLANGIOPANCREATOGRAPHY (ERCP) WITH PROPOFOL N/A 03/11/2020  ? Procedure: ENDOSCOPIC RETROGRADE CHOLANGIOPANCREATOGRAPHY (ERCP) WITH PROPOFOL;  Surgeon: Rush Landmark Telford Nab., MD;  Location: [facility];  Service: Gastroenterology;  Laterality: N/A;  ? ENDOSCOPIC RETROGRADE CHOLANGIOPANCREATOGRAPHY (ERCP) WITH PROPOFOL N/A 06/03/2020  ? Procedure: ENDOSCOPIC RETROGRADE CHOLANGIOPANCREATOGRAPHY (ERCP) WITH PROPOFOL;  Surgeon: Rush Landmark Telford Nab., MD;  Location: [facility];  Service: Gastroenterology;  Laterality: N/A;  ? ERCP N/A 11/28/2018  ? Procedure: ENDOSCOPIC RETROGRADE CHOLANGIOPANCREATOGRAPHY (ERCP) +EGD with spyglass;  Surgeon: Rush Landmark Telford Nab., MD;  Location: [place];  Service: Gastroenterology;  Laterality: N/A;  ? ESOPHAGOGASTRODUODENOSCOPY  04/29/2010  ? ESOPHAGOGASTRODUODENOSCOPY (EGD) WITH PROPOFOL N/A 04/01/2018  ? Procedure: ESOPHAGOGASTRODUODENOSCOPY (EGD) WITH PROPOFOL;  Surgeon:  Rush Landmark Telford Nab., MD;  Location: [place];  Service: Gastroenterology;  Laterality: N/A;  ? ESOPHAGOGASTRODUODENOSCOPY (EGD) WITH PROPOFOL N/A 09/12/2018  ? Procedure: ESOPHAGOGASTRODUODENOSCOPY (EGD) WITH PROPOFOL;  Surgeon: Rush Landmark Telford Nab., MD;  Location: [place];  Service: Gastroenterology;  Laterality: N/A;  ? ESOPHAGOGASTRODUODENOSCOPY (EGD) WITH PROPOFOL N/A 11/28/2018  ? Procedure: ESOPHAGOGASTRODUODENOSCOPY (EGD) WITH PROPOFOL;  Surgeon: Rush Landmark Telford Nab., MD;  Location: [place];  Service: Gastroenterology;  Laterality: N/A;  ? ESOPHAGOGASTRODUODENOSCOPY (EGD) WITH PROPOFOL N/A 03/11/2020  ? Procedure: ESOPHAGOGASTRODUODENOSCOPY (EGD) WITH PROPOFOL;  Surgeon: Rush Landmark Telford Nab., MD;  Location: [facility];  Service: Gastroenterology;  Laterality: N/A;  ? ESOPHAGOGASTRODUODENOSCOPY (EGD) WITH PROPOFOL N/A 06/03/2020  ? Procedure: ESOPHAGOGASTRODUODENOSCOPY (EGD) WITH PROPOFOL;  Surgeon: Rush Landmark Telford Nab., MD;  Location: [facility];  Service: Gastroenterology;  Laterality: N/A;  ? EUS  04/01/2018  ? Procedure: FULL UPPER ENDOSCOPIC ULTRASOUND (EUS) RADIAL;  Surgeon: Irving Copas., MD;  Location: [place];  Service: Gastroenterology;;  ? EUS N/A 09/12/2018  ? Procedure: UPPER ENDOSCOPIC ULTRASOUND (EUS) RADIAL;  Surgeon: Rush Landmark Telford Nab., MD;  Location: [place];  Service: Gastroenterology;  Laterality: N/A;  ? FINE NEEDLE ASPIRATION  09/12/2018  ? Procedure: FINE NEEDLE ASPIRATION (FNA) LINEAR;  Surgeon: Rush Landmark Telford Nab., MD;  Location: [facility];  Service: Gastroenterology;;  ? HAND SURGERY Left 2018  ? saw accident  ? HEMOSTASIS CLIP PLACEMENT  09/12/2018  ? Procedure: HEMOSTASIS CLIP PLACEMENT;  Surgeon: Irving Copas., MD;  Location: [place];  Service: Gastroenterology;;  ? HEMOSTASIS CLIP PLACEMENT  06/03/2020  ?  Procedure: HEMOSTASIS CLIP PLACEMENT;  Surgeon: Irving Copas., MD;  Location: [facility];   Service: Gastroenterology;;  ? I & D EXTREMITY Left 11/24/2016  ? Procedure: IRRIGATION AND DEBRIDEMENT LEFT HAND, THUMB, INDEX, MIDDLE, RING, AND SMALL FINGERS WITH RECONSTRUCTION;  Surgeon: Roseanne Kaufman, MD;  Location: [place]

## 2021-06-03 LAB — MICROALBUMIN / CREATININE URINE RATIO
Creatinine,U: 128.6 mg/dL
Microalb Creat Ratio: 12.1 mg/g (ref 0.0–30.0)
Microalb, Ur: 15.6 mg/dL — ABNORMAL HIGH (ref 0.0–1.9)

## 2021-06-28 ENCOUNTER — Other Ambulatory Visit: Payer: Self-pay | Admitting: Cardiovascular Disease

## 2021-06-30 ENCOUNTER — Telehealth: Payer: Self-pay | Admitting: Pharmacist

## 2021-06-30 NOTE — Chronic Care Management (AMB) (Signed)
    Chronic Care Management Pharmacy Assistant   Name: [name]  MRN: [id number] DOB: 08-18-1940  07/01/2021 APPOINTMENT REMINDER  [name]'s wife [name] was reminded to have all medications, supplements and any blood glucose and blood pressure readings available for review with [name], Pharm. D, at his telephone visit on 07/01/2021 at 10:30.  Care Gaps: AWV - previous message sent to [name]  Last BP - 130/70 on 06/02/2021 Last A1C - 5.9 on 06/02/2021 Foot exam - never done Eye exam - never done Hep C Screen - never done Shingrix - never done  Star Rating Drug: Rosuvastatin 10 mg - last filled 04/22/2021 90 DS at [facility]  Any gaps in medications fill history?  No  Gennie Alma [facility]  Catering manager [phone]

## 2021-07-01 ENCOUNTER — Ambulatory Visit (INDEPENDENT_AMBULATORY_CARE_PROVIDER_SITE_OTHER): Payer: Medicare Other | Admitting: Pharmacist

## 2021-07-01 ENCOUNTER — Telehealth: Payer: Self-pay | Admitting: *Deleted

## 2021-07-01 DIAGNOSIS — I5032 Chronic diastolic (congestive) heart failure: Secondary | ICD-10-CM

## 2021-07-01 DIAGNOSIS — I10 Essential (primary) hypertension: Secondary | ICD-10-CM

## 2021-07-01 NOTE — Progress Notes (Signed)
Chronic Care Management Pharmacy Note  07/01/2021 Name:  [name] MRN:  [id number] DOB:  03/26/1940  Summary: BP is at goal < 140/90 per home and office readings Pt is having more pain than usual  Recommendations/Changes made from today's visit: -Recommended bringing home BP cuff to office for next visit -Recommended trial of Biotene -Provided telephone number for [facility] and recommended setting up appt with previous provider -Recommended discussing with Dr. Suszanne Conners about CPAP machine  Plan: Follow up BP assessment in 2-3 months Follow up in 6 months  Subjective: [name] is an 81 y.o. year old male who is a primary patient of Philip Aspen, Limmie Patricia, MD.  The CCM team was consulted for assistance with disease management and care coordination needs.    Engaged with patient by telephone for follow up visit in response to provider referral for pharmacy case management and/or care coordination services.   Consent to Services:  The patient was given information about Chronic Care Management services, agreed to services, and gave verbal consent prior to initiation of services.  Please see initial visit note for detailed documentation.   Patient Care Team: Philip Aspen, Limmie Patricia, MD as PCP - General (Internal Medicine) Lennette Bihari, MD as PCP - Cardiology (Cardiology) Verner Chol, [facility] as Pharmacist (Pharmacist)  Recent office visits: 06/02/21 Peggye Pitt, MD: Patient presented for DM follow up and pain management. Refill oxycodone/APAP x 3 months.  03/05/21 Peggye Pitt, MD: Patient presented for DM follow up and pain management. Refill oxycodone/APAP x 3 months.  Recent consult visits: 03/11/21 Adam Phenix (podiatry): Patient presented for toenail debridement. Unable to access notes.  02/13/21 Anne Fu, NP (urology): Patient presented for follow up for poor urinary stream. Unable to access notes.  01/30/21 Newman Pies (ENT): Patient  presented for chronic rhinitis. Unable to access notes.  01/29/21 [name] Deeds, MD (rheumatology): Patient presented for polyarthritis follow up. Unable to access notes.  06/18/20 Donzetta Starch (dermatology): Patient presented for seborrheic keratosis follow up. Unable to access notes.   Hospital visits: Medication Reconciliation was completed by comparing discharge summary, patient's EMR and Pharmacy list, and upon discussion with patient.   Patient [facility] on 05/26/21 for 4 hours for septoplasty procedure.   New?Medications Started at [facility] Discharge:?? -started None.   Medication Changes at Hospital Discharge: -Changed none   Medications Discontinued at Hospital Discharge: -Stopped None.    Medications that remain the same after Hospital Discharge:??  -All other medications will remain the same.     Objective:  Lab Results  Component Value Date   CREATININE 1.47 (H) 05/20/2021   BUN 20 05/20/2021   GFR 41.03 (L) 07/27/2018   GFRNONAA 48 (L) 05/20/2021   GFRAA 47 (L) 11/13/2019   NA 140 05/20/2021   K 4.7 05/20/2021   CALCIUM 8.9 05/20/2021   CO2 28 05/20/2021   GLUCOSE 151 (H) 05/20/2021    Lab Results  Component Value Date/Time   HGBA1C 5.9 (A) 06/02/2021 09:50 AM   HGBA1C 6.0 (A) 03/05/2021 09:51 AM   HGBA1C 7.0 (H) 03/31/2018 01:09 AM   GFR 41.03 (L) 07/27/2018 04:18 PM   GFR 46.94 (L) 05/18/2018 03:19 PM   MICROALBUR 15.6 (H) 06/02/2021 10:33 AM    Last diabetic Eye exam: No results found for: "HMDIABEYEEXA"  Last diabetic Foot exam: No results found for: "HMDIABFOOTEX"   Lab Results  Component Value Date   CHOL 118 12/09/2020   HDL 36 (L) 12/09/2020  LDLCALC 62 12/09/2020   LDLDIRECT 90.3 04/17/2008   TRIG 104 12/09/2020   CHOLHDL 3.3 12/09/2020       Latest Ref Rng & Units 12/09/2020    8:53 AM 03/22/2020   12:47 PM 11/09/2019    2:08 PM  Hepatic Function  Total Protein 6.0 - 8.5 g/dL 7.2  7.1  6.9   Albumin 3.7 -  4.7 g/dL 4.2  3.7  3.7   AST 0 - 40 IU/L $Remov'22  17  18   'OhSSlF$ ALT 0 - 44 IU/L $Remov'11  9  8   'wUUumf$ Alk Phosphatase 44 - 121 IU/L 83  67  70   Total Bilirubin 0.0 - 1.2 mg/dL 0.4  0.4  0.4   Bilirubin, Direct 0.0 - 0.3 mg/dL  0.1  0.1     Lab Results  Component Value Date/Time   TSH 3.720 12/09/2020 08:53 AM   TSH 3.040 07/20/2019 08:50 AM       Latest Ref Rng & Units 12/09/2020    8:53 AM 11/09/2019    9:39 AM 07/20/2019    8:50 AM  CBC  WBC 3.4 - 10.8 x10E3/uL 7.8  7.2  8.7   Hemoglobin 13.0 - 17.7 g/dL 13.7  9.7  11.4   Hematocrit 37.5 - 51.0 % 40.5  31.0  33.8   Platelets 150 - 450 x10E3/uL 161  172  177     Lab Results  Component Value Date/Time   VD25OH 28.9 (L) 07/20/2019 08:50 AM    Clinical ASCVD: Yes  The ASCVD Risk score (Arnett DK, et al., 2019) failed to calculate for the following reasons:   The patient has a prior MI or stroke diagnosis       06/02/2021   10:17 AM 03/05/2021    9:53 AM 11/09/2019    8:55 AM  Depression screen PHQ 2/9  Decreased Interest 1 3 0  Down, Depressed, Hopeless 2 2 0  PHQ - 2 Score 3 5 0  Altered sleeping 2 1 0  Tired, decreased energy 3 3 0  Change in appetite 2 2 0  Feeling bad or failure about yourself  2 2 0  Trouble concentrating 3 2 0  Moving slowly or fidgety/restless 1  0  Suicidal thoughts 1 1 0  PHQ-9 Score 17 16 0  Difficult doing work/chores Extremely dIfficult  Not difficult at all     Social History   Tobacco Use  Smoking Status Former   Packs/day: 0.50   Years: 5.00   Total pack years: 2.50   Types: Cigarettes   Quit date: 03/30/1976   Years since quitting: 45.2  Smokeless Tobacco Never   BP Readings from Last 3 Encounters:  06/02/21 130/70  05/26/21 116/61  03/05/21 135/65   Pulse Readings from Last 3 Encounters:  06/02/21 60  05/26/21 64  03/05/21 (!) 55   Wt Readings from Last 3 Encounters:  06/02/21 173 lb 1.6 oz (78.5 kg)  05/26/21 172 lb 13.5 oz (78.4 kg)  03/05/21 173 lb 14.4 oz (78.9 kg)   BMI  Readings from Last 3 Encounters:  06/02/21 25.56 kg/m  05/26/21 25.52 kg/m  03/05/21 25.68 kg/m    Assessment/Interventions: Review of patient past medical history, allergies, medications, health status, including review of consultants reports, laboratory and other test data, was performed as part of comprehensive evaluation and provision of chronic care management services.   SDOH:  (Social Determinants of Health) assessments and interventions performed: No  SDOH Screenings   Alcohol Screen:  Not on file  Depression (PHQ2-9): Medium Risk (06/02/2021)   Depression (PHQ2-9)    PHQ-2 Score: 17  Financial Resource Strain: Low Risk  (03/03/2021)   Overall Financial Resource Strain (CARDIA)    Difficulty of Paying Living Expenses: Not hard at all  Food Insecurity: No Food Insecurity (03/03/2021)   Hunger Vital Sign    Worried About Running Out of Food in the Last Year: Never true    Ran Out of Food in the Last Year: Never true  Housing: Low Risk  (03/03/2021)   Housing    Last Housing Risk Score: 0  Physical Activity: Unknown (03/03/2021)   Exercise Vital Sign    Days of Exercise per Week: 0 days    Minutes of Exercise per Session: Not on file  Social Connections: Moderately Integrated (03/03/2021)   Social Connection and Isolation Panel [NHANES]    Frequency of Communication with Friends and Family: More than three times a week    Frequency of Social Gatherings with Friends and Family: Three times a week    Attends Religious Services: More than 4 times per year    Active Member of Clubs or Organizations: Yes    Attends Archivist Meetings: Never    Marital Status: Widowed  Stress: Stress Concern Present (03/03/2021)   [place]    Feeling of Stress : To some extent  Tobacco Use: Medium Risk (06/02/2021)   Patient History    Smoking Tobacco Use: Former    Smokeless Tobacco Use: Never    Passive Exposure:  Not on file  Transportation Needs: No Transportation Needs (03/03/2021)   PRAPARE - Hydrologist (Medical): No    Lack of Transportation (Non-Medical): No    CCM Care Plan  No Active Allergies   Medications Reviewed Today     Reviewed by Isaac Bliss, Rayford Halsted, MD (Physician) on 06/02/21 at [place] List Status: <None>   Medication Order Taking? Sig Documenting Provider Last Dose Status Informant  acetaminophen (TYLENOL) 500 MG tablet [id number] Yes Take 1,000 mg by mouth every 6 (six) hours as needed for moderate pain. [provider] Taking Active Family Member  amLODipine (NORVASC) 2.5 MG tablet [id number] Yes Take 2 tablets by mouth once daily Troy Sine, MD Taking Active   cetirizine (ZYRTEC) 10 MG tablet [id number] Yes Take 10 mg by mouth daily as needed for allergies. [provider] Taking Active Family Member  Coenzyme Q10 300 MG CAPS [id number] Yes Take 300 mg by mouth every evening. [provider] Taking Active Family Member  cyanocobalamin (,VITAMIN B-12,) 1000 MCG/ML injection [id number] Yes Inject 1 mL (1,000 mcg total) into the skin every 30 (thirty) days. Isaac Bliss, Rayford Halsted, MD Taking Active   dextromethorphan-guaiFENesin [facility] DM) 30-600 MG 12hr tablet [id number] Yes Take 1 tablet by mouth 2 (two) times daily. [provider] Taking Active   ezetimibe (ZETIA) 10 MG tablet [id number] Yes Take 1 tablet by mouth once daily Isaac Bliss, Rayford Halsted, MD Taking Active   Ferrous Sulfate (IRON) 28 MG TABS [id number] Yes Take 28 mg by mouth daily. [provider] Taking Active Family Member  furosemide (LASIX) 40 MG tablet [id number] Yes Take 1 tablet (40 mg total) by mouth every other day.  Patient taking differently: Take 40 mg by mouth daily as needed for fluid or edema.   Deberah Pelton, NP Taking Active   guaifenesin (HUMIBID E) 400 MG  TABS tablet [id number] Yes Take 400 mg by mouth daily.  [provider] Taking Active Family Member  hyoscyamine (LEVSIN SL) 0.125 MG SL tablet [id number] Yes Place 1 tablet (0.125 mg total) under the tongue every 4 (four) hours as needed.  Patient taking differently: Place 0.125 mg under the tongue every 4 (four) hours as needed (spasms).   Mansouraty, Telford Nab., MD Taking Active   isosorbide mononitrate (IMDUR) 60 MG 24 hr tablet [id number] Yes Take 1 tablet by mouth once daily Troy Sine, MD Taking Active   Menthol, Topical Analgesic, [facility] EX) [id number] Yes Apply 1 application topically daily as needed (pain). [provider] Taking Active Family Member  METAMUCIL FIBER PO [id number] Yes Take 3 capsules by mouth daily. [provider] Taking Active Family Member  Multiple Vitamins-Minerals (MULTIVITAMIN WITH MINERALS) tablet [id number] Yes Take 1 tablet by mouth daily. [provider] Taking Active Family Member  Naphazoline-Pheniramine (OPCON-A) 0.027-0.315 % SOLN [id number] Yes Place 1 drop into both eyes daily as needed (itching eyes). [provider] Taking Active Family Member  nebivolol (BYSTOLIC) 10 MG tablet [id number] Yes Take 1 tablet by mouth once daily Troy Sine, MD Taking Active   NEEDLE, DISP, 25 G (B-D DISP NEEDLE 25GX1") 25G X 1" MISC [id number] Yes Inject 1000 mcg into muscle once a month. Isaac Bliss, Rayford Halsted, MD Taking Active Family Member  nitroGLYCERIN (NITROSTAT) 0.4 MG SL tablet [id number] Yes PLACE 1 TABLET UNDER THE TONGUE EVERY 5 MINUTES AS NEEDED FOR CHEST PAIN Isaac Bliss, Rayford Halsted, MD Taking Active   oxyCODONE-acetaminophen (PERCOCET) 10-325 MG tablet [id number]  Take 1 tablet by mouth 2 (two) times daily. Isaac Bliss, Rayford Halsted, MD  Active   oxyCODONE-acetaminophen [facility]) 10-325 MG tablet [id number]  Take 1 tablet by mouth 2 (two) times daily as needed for pain. Isaac Bliss, Rayford Halsted, MD  Active   oxyCODONE-acetaminophen [facility]) 10-325 MG tablet  [id number]  Take 1 tablet by mouth 2 (two) times daily as needed for pain. Isaac Bliss, Rayford Halsted, MD  Active   pantoprazole (PROTONIX) 40 MG tablet [id number]  Take 1 tablet (40 mg total) by mouth daily. Mansouraty, Telford Nab., MD  Expired 05/19/21 2359   Probiotic Product (PROBIOTIC ADVANCED PO) [id number] Yes Take 1 tablet by mouth every evening. [provider] Taking Active Family Member  rOPINIRole (REQUIP) 2 MG tablet [id number] Yes TAKE 1 TABLET BY MOUTH AT BEDTIME Isaac Bliss, Rayford Halsted, MD Taking Active   rosuvastatin (CRESTOR) 10 MG tablet [id number] Yes Take 1 tablet by mouth once daily Troy Sine, MD Taking Active   venlafaxine [facility]) 75 MG tablet [id number] Yes Take 1 tablet by mouth once daily Isaac Bliss, Rayford Halsted, MD Taking Active             Patient Active Problem List   Diagnosis Date Noted   Bloating 04/27/2020   Gas pain 04/27/2020   Abdominal cramping 04/27/2020   Functional abdominal pain syndrome 11/12/2019   Lower extremity edema 11/12/2019   Hyperlipidemia    History of cholecystectomy 01/19/2019   Chronic diarrhea 01/19/2019   Antiplatelet or antithrombotic long-term use 01/19/2019   Non-ST elevation (NSTEMI) myocardial infarction [facility])    Acute hypoxemic respiratory failure ([place]) 01/04/2019   Multifocal pneumonia 01/04/2019   Severe sepsis ([place]) 01/04/2019   Elevated troponin 01/04/2019   Acute on chronic diastolic (congestive) heart failure ([place]) 01/04/2019   Acute respiratory failure ([place]) 01/04/2019   elevated IgG4 11/01/2018   Choledocholithiasis 10/27/2018  Dilation of biliary tract 10/27/2018   Esophageal dysphagia 10/27/2018   Upper airway cough syndrome 07/28/2018   Pleural effusion on left 07/28/2018   History of pancreatitis 05/12/2018   Abnormal LFTs 05/12/2018   Abnormal findings on esophagogastroduodenoscopy (EGD) 05/12/2018   History of ERCP 05/12/2018   Anemia 05/12/2018   Biliary stricture    ESBL  (extended spectrum beta-lactamase) producing bacteria infection 04/04/2018   Ascending cholangitis 04/01/2018   Diabetes mellitus type 2, noninsulin dependent ([place]) 03/31/2018   Pancreatitis 03/31/2018   Sepsis ([place]) 03/31/2018   Bacteremia due to Gram-negative bacteria 03/31/2018   Abdominal pain    Dilated bile duct    Acute biliary pancreatitis 03/30/2018   Leukocytosis 03/30/2018   CAD (coronary artery disease) 03/30/2018   Osteoarthritis of left knee 10/18/2017   Cervical radiculopathy 10/04/2017   Pain of left hand 07/19/2017   Pre-operative cardiovascular examination 06/16/2017   CKD (chronic kidney disease), stage III 06/16/2017   Carpal tunnel syndrome 05/27/2017   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 05/10/2017   Open fracture of base of middle phalanx of finger 02/17/2017   Laceration of index finger 02/17/2017   Laceration of nail bed of finger 02/17/2017   Laceration of thumb 02/17/2017   Open fracture of distal phalanx of finger 02/17/2017   Open fractures of multiple sites of phalanx of left hand 11/24/2016   B12 deficiency 05/28/2015   GERD (gastroesophageal reflux disease) 11/16/2014   Hyperlipidemia LDL goal <70 11/21/2013   DOE (dyspnea on exertion) 11/21/2013   Other dysphagia 07/14/2013   History of esophageal stricture 07/14/2013   Hx of CABG 06/05/2013   Leg pain, bilateral 10/18/2012   Restless leg syndrome 06/21/2012   ULNAR NEUROPATHY, LEFT 08/05/2009   Urinary obstruction 10/19/2008   ACTINIC KERATOSIS 06/25/2008   DRY EYE SYNDROME 10/13/2007   CARCINOMA, BLADDER, HX OF 07/02/2007   BARRETTS ESOPHAGUS 05/23/2007   Hypothyroidism 04/08/2007   Other malaise and fatigue 04/08/2007   Essential hypertension 10/12/2006   ANEMIA, B12 DEFICIENCY 09/08/2006   Depression 07/29/2006   NEUROPATHY, IDIOPATHIC PERIPHERAL NEC 07/29/2006   Allergic rhinitis 07/29/2006   LOW BACK PAIN 07/29/2006   COLONIC POLYPS 11/04/2000    Immunization History   Administered Date(s) Administered   Fluad Quad(high Dose 65+) 11/02/2018, 11/09/2019   Influenza Split 10/01/2010, 10/16/2011   Influenza Whole 01/20/2004, 11/24/2006, 10/19/2008, 10/22/2009   Influenza, High Dose Seasonal PF 10/19/2013, 10/22/2014, 09/30/2015, 10/26/2016, 11/19/2017, 11/01/2020   Influenza,inj,Quad PF,6+ Mos 10/18/2012   Influenza,inj,quad, With Preservative 10/19/2016   Moderna Sars-Covid-2 Vaccination 03/12/2019, 04/03/2019, 12/29/2019, 10/22/2020   Pfizer Covid-19 Vaccine Bivalent Booster 85yrs & up 10/22/2020   Pneumococcal Conjugate-13 05/29/2013   Pneumococcal Polysaccharide-23 10/22/2009   Tdap 11/24/2016   Tetanus 05/29/2013   Patient reports his left side is starting to have pain now. He was mowing and hurt his left side. He is using ice and heat to wait to see if he is going to go to [name]. Provided the phone number for Suella Broad, MD.  Patient reports he has a bitter taste in his mouth. He wasn't sure if this was coming from his medications or not.   Conditions to be addressed/monitored:  Hypertension, Hyperlipidemia, Heart Failure, Coronary Artery Disease, GERD, and Allergic Rhinitis  Conditions addressed this visit: Hypertension, GERD, pain  Care Plan : [place]  Updates made by Viona Gilmore, [place] since 07/01/2021 12:00 AM     Problem: Problem: Hypertension, Hyperlipidemia, Heart Failure, Coronary Artery Disease, GERD, and Allergic  Rhinitis      Long-Range Goal: Patient-Specific Goal   Start Date: 11/11/2020  Expected End Date: 11/11/2021  Recent Progress: On track  Priority: High  Note:   Current Barriers:  Unable to independently monitor therapeutic efficacy  Pharmacist Clinical Goal(s):  Patient will achieve adherence to monitoring guidelines and medication adherence to achieve therapeutic efficacy through collaboration with PharmD and provider.   Interventions: 1:1 collaboration with Isaac Bliss, Rayford Halsted, MD regarding development and update of comprehensive plan of care as evidenced by provider attestation and co-signature Inter-disciplinary care team collaboration (see longitudinal plan of care) Comprehensive medication review performed; medication list updated in electronic medical record  Hypertension (BP goal <140/90) -Controlled -Current treatment: Amlodipine 2.5mg , 2 tablets once daily - in AM - Appropriate, Effective, Safe, Accessible nebivolol (Bystolic) 10mg , 1 tablet once daily - in AM - Appropriate, Effective, Safe, Accessible -Medications previously tried: losartan  -Current home readings: 130s/70s, 145/80, 134/69, 128/69, 144/77, 138/75, 127/70 (some 140s every once in awhile) (every 2-3 days) - arm cuff (medicare gave it him) - checking  -Current dietary habits: did not discuss -Current exercise habits: minimal due to pain -Denies hypotensive/hypertensive symptoms -Educated on BP goals and benefits of medications for prevention of heart attack, stroke and kidney damage; Importance of home blood pressure monitoring; Proper BP monitoring technique; Symptoms of hypotension and importance of maintaining adequate hydration; -Counseled to monitor BP at home weekly, document, and provide log at future appointments -Counseled on diet and exercise extensively Recommended to continue current medication  Hyperlipidemia: (LDL goal < 70) -Controlled -Current treatment: Rosuvastatin 20mg , 1 tablet once daily - Appropriate, Effective, Safe, Accessible Ezetimibe (Zetia) 10mg , 1 tablet once daily - Appropriate, Effective, Safe, Accessible -Medications previously tried: atorvastatin, simvastatin  -Current dietary patterns: did not discuss -Current exercise habits: minimal with pain -Educated on Cholesterol goals;  Benefits of statin for ASCVD risk reduction; -Counseled on diet and exercise extensively Recommended to continue current medication Recommended repeat lipid  panel.  History of NSTEMI (Goal: prevent future heart events) -Controlled -Current treatment  Isosorbide mononitrate 60mg , 1 tablet once daily - Appropriate, Effective, Safe, Accessible Nitroglycerin 0.4mg  SL, 1 tablet under tongue every five minutes as needed for chest pain - Appropriate, Effective, Safe, Accessible Aspirin 81mg , 1 tablet once daily - Appropriate, Effective, Safe, Accessible Clopidogrel 75mg , 1 tablet once daily - Appropriate, Effective, Safe, Accessible -Medications previously tried: none  -Recommended to continue current medication  Heart Failure (Goal: manage symptoms and prevent exacerbations) -Controlled -Last ejection fraction: 50-55% (Date: 04/10/19) -HF type: Diastolic -NYHA Class: II (slight limitation of activity) -AHA HF Stage: C (Heart disease and symptoms present) -Current treatment: Furosemide 40 mg 1 tablet as needed for edema - Appropriate, Effective, Safe, Accessible -Medications previously tried: HCTZ  -Current home BP/HR readings: refer to above -Current dietary habits: did not discuss -Current exercise habits: minimal with pain -Educated on Benefits of medications for managing symptoms and prolonging life Importance of weighing daily; if you gain more than 3 pounds in one day or 5 pounds in one week, call cardiologist. -Counseled on diet and exercise extensively Recommended to continue current medication  Pain (Goal: minimize pain) -Uncontrolled -Current treatment  Acetaminophen 325mg , 2 tablets every six hours as needed for mild pain (patient reports taking 1 tablet at night occasionally)  - Appropriate, Effective, Safe, Accessible Menthol analgesic ([place]) apply topically daily as needed for pain - Appropriate, Effective, Safe, Accessible Oxycodone- APAP 10/325g, 1 tablet twice daily as needed for pain (patient reports taking  0.5 tablet in the morning lunch, and sometimes at dinner time) - Appropriate, Effective, Query Safe,  Accessible -Medications previously tried: n/a  -Recommended discussing worsening pain with ortho and provided telephone number for previous provider.  Vitamin B12 deficiency (Goal: 211-911) -Controlled -Current treatment  Cyanocobalamin 1079mcg/ml, inject 1 ML into muscle for 1 dose (once a month) - Appropriate, Effective, Safe, Accessible -Medications previously tried: none  -Recommended repeat vitamin B12 level.  Barrett's esophagus/GERD (Goal: minimize symptoms) -Not ideally controlled -Current treatment  Pantoprazole 40mg , 1 capsule once daily - Appropriate, Effective, Safe, Accessible -Medications previously tried: omeprazole (drug interaction with pantoprazole) -Recommended switching to pantoprazole to avoid drug interaction with clopidogrel.  Allergic rhinitis (Goal: minimize symptoms) -Controlled -Current treatment  Fluticasone (Flonase) nasal spray, 1 to 2 sprays into both nostrils at bedtime as needed for allergies or rhinitis - Appropriate, Effective, Safe, Accessible Loratadine 10mg , 1 tablet once daily - Appropriate, Effective, Safe, Accessible -Medications previously tried: n/a  -Recommended to continue current medication  Restless legs syndrome (Goal: minimize symptoms) -Controlled -Current treatment  Ropinirole 2mg , 1 tablet at bedtime - Appropriate, Effective, Safe, Accessible -Medications previously tried: none  -Recommended to continue current medication  Depression (Goal: minimize symptoms) -Controlled -Current treatment: Venlafaxine 75mg , 1 tablet once daily - Appropriate, Effective, Safe, Accessible -Medications previously tried/failed: n/a -PHQ9: 0 -GAD7: n/a -Educated on Benefits of medication for symptom control Benefits of cognitive-behavioral therapy with or without medication -Recommended to continue current medication  Health Maintenance -Vaccine gaps: shingrix -Current therapy:  Florastor 250mg , 1 capsule twice  Metamucil 3 capsules daily   Multivitamin 1 tablet daily Coenzyme Q10 300 mg capsules daily -Educated on Cost vs benefit of each product must be carefully weighed by individual consumer -Patient is satisfied with current therapy and denies issues -Counseled on limiting use of metamucil as this could be contributing to loose stools.  Patient Goals/Self-Care Activities Patient will:  - take medications as prescribed check blood pressure weekly, document, and provide at future appointments  Follow Up Plan: Telephone follow up appointment with care management team member scheduled for: 6 months         Medication Assistance: None required.  Patient affirms current coverage meets needs.  Compliance/Adherence/Medication fill history: Care Gaps: Shingrix, foot exam, eye exam, Hep C screening Last BP - 130/70 on 06/02/2021 Last A1C - 5.9 on 06/02/2021  Star-Rating Drugs: Rosuvastatin 10 mg - last filled 04/22/2021 90 DS at [facility]  Patient's preferred pharmacy is:  [facility] [address], [place] - [place] [address] [place] [place] [address] Phone: [phone] Fax: [phone]   Uses pill box? Yes Pt endorses 95% compliance  We discussed: Current pharmacy is preferred with insurance plan and patient is satisfied with pharmacy services Patient decided to: Continue current medication management strategy  Care Plan and Follow Up Patient Decision:  Patient agrees to Care Plan and Follow-up.  Plan: Telephone follow up appointment with care management team member scheduled for:  6 months  Jeni Salles, PharmD, [facility] [place] [phone]

## 2021-07-01 NOTE — Telephone Encounter (Signed)
-----   Message from Viona Gilmore, [facility] sent at 07/01/2021 12:55 PM EDT ----- Regarding: Update telephone number Hi,  Mr. Weare requested his telephone number to be updated/added. This is his home phone number:  [phone]. Please keep the mobile number on there but add this as his home phone and he wants it to be his primary number.  Thanks! Maddie

## 2021-07-01 NOTE — Telephone Encounter (Signed)
Chart updated

## 2021-07-01 NOTE — Patient Instructions (Addendum)
Hi [name],  It was great to catch up again! Don't forget to try the Biotene for your dry mouth.  Please reach out to me if you have any questions or need anything before our follow up!  Best, Maddie  Jeni Salles, PharmD, [facility] [place]   Visit Information   Goals Addressed   None    Patient Care Plan: CCM Pharmacy Care Plan     Problem Identified: Problem: Hypertension, Hyperlipidemia, Heart Failure, Coronary Artery Disease, GERD, and Allergic Rhinitis      Long-Range Goal: Patient-Specific Goal   Start Date: 11/11/2020  Expected End Date: 11/11/2021  Recent Progress: On track  Priority: High  Note:   Current Barriers:  Unable to independently monitor therapeutic efficacy  Pharmacist Clinical Goal(s):  Patient will achieve adherence to monitoring guidelines and medication adherence to achieve therapeutic efficacy through collaboration with PharmD and provider.   Interventions: 1:1 collaboration with Isaac Bliss, Rayford Halsted, MD regarding development and update of comprehensive plan of care as evidenced by provider attestation and co-signature Inter-disciplinary care team collaboration (see longitudinal plan of care) Comprehensive medication review performed; medication list updated in electronic medical record  Hypertension (BP goal <140/90) -Controlled -Current treatment: Amlodipine 2.'5mg'$ , 2 tablets once daily - in AM - Appropriate, Effective, Safe, Accessible nebivolol (Bystolic) '10mg'$ , 1 tablet once daily - in AM - Appropriate, Effective, Safe, Accessible -Medications previously tried: losartan  -Current home readings: 130s/70s, 145/80, 134/69, 128/69, 144/77, 138/75, 127/70 (some 140s every once in awhile) (every 2-3 days) - arm cuff (medicare gave it him) - checking  -Current dietary habits: did not discuss -Current exercise habits: minimal due to pain -Denies hypotensive/hypertensive  symptoms -Educated on BP goals and benefits of medications for prevention of heart attack, stroke and kidney damage; Importance of home blood pressure monitoring; Proper BP monitoring technique; Symptoms of hypotension and importance of maintaining adequate hydration; -Counseled to monitor BP at home weekly, document, and provide log at future appointments -Counseled on diet and exercise extensively Recommended to continue current medication  Hyperlipidemia: (LDL goal < 70) -Controlled -Current treatment: Rosuvastatin '20mg'$ , 1 tablet once daily - Appropriate, Effective, Safe, Accessible Ezetimibe (Zetia) '10mg'$ , 1 tablet once daily - Appropriate, Effective, Safe, Accessible -Medications previously tried: atorvastatin, simvastatin  -Current dietary patterns: did not discuss -Current exercise habits: minimal with pain -Educated on Cholesterol goals;  Benefits of statin for ASCVD risk reduction; -Counseled on diet and exercise extensively Recommended to continue current medication Recommended repeat lipid panel.  History of NSTEMI (Goal: prevent future heart events) -Controlled -Current treatment  Isosorbide mononitrate '60mg'$ , 1 tablet once daily - Appropriate, Effective, Safe, Accessible Nitroglycerin 0.'4mg'$  SL, 1 tablet under tongue every five minutes as needed for chest pain - Appropriate, Effective, Safe, Accessible Aspirin '81mg'$ , 1 tablet once daily - Appropriate, Effective, Safe, Accessible Clopidogrel '75mg'$ , 1 tablet once daily - Appropriate, Effective, Safe, Accessible -Medications previously tried: none  -Recommended to continue current medication  Heart Failure (Goal: manage symptoms and prevent exacerbations) -Controlled -Last ejection fraction: 50-55% (Date: 04/10/19) -HF type: Diastolic -NYHA Class: II (slight limitation of activity) -AHA HF Stage: C (Heart disease and symptoms present) -Current treatment: Furosemide 40 mg 1 tablet as needed for edema - Appropriate,  Effective, Safe, Accessible -Medications previously tried: HCTZ  -Current home BP/HR readings: refer to above -Current dietary habits: did not discuss -Current exercise habits: minimal with pain -Educated on Benefits of medications for managing symptoms and prolonging life Importance of weighing daily; if you  gain more than 3 pounds in one day or 5 pounds in one week, call cardiologist. -Counseled on diet and exercise extensively Recommended to continue current medication  Pain (Goal: minimize pain) -Uncontrolled -Current treatment  Acetaminophen '325mg'$ , 2 tablets every six hours as needed for mild pain (patient reports taking 1 tablet at night occasionally)  - Appropriate, Effective, Safe, Accessible Menthol analgesic (Bengay) apply topically daily as needed for pain - Appropriate, Effective, Safe, Accessible Oxycodone- APAP 10/325g, 1 tablet twice daily as needed for pain (patient reports taking 0.5 tablet in the morning lunch, and sometimes at dinner time) - Appropriate, Effective, Query Safe, Accessible -Medications previously tried: n/a  -Recommended discussing worsening pain with ortho and provided telephone number for previous provider.  Vitamin B12 deficiency (Goal: 211-911) -Controlled -Current treatment  Cyanocobalamin 1010mg/ml, inject 1 ML into muscle for 1 dose (once a month) - Appropriate, Effective, Safe, Accessible -Medications previously tried: none  -Recommended repeat vitamin B12 level.  Barrett's esophagus/GERD (Goal: minimize symptoms) -Not ideally controlled -Current treatment  Pantoprazole '40mg'$ , 1 capsule once daily - Appropriate, Effective, Safe, Accessible -Medications previously tried: omeprazole (drug interaction with pantoprazole) -Recommended switching to pantoprazole to avoid drug interaction with clopidogrel.  Allergic rhinitis (Goal: minimize symptoms) -Controlled -Current treatment  Fluticasone (Flonase) nasal spray, 1 to 2 sprays into both  nostrils at bedtime as needed for allergies or rhinitis - Appropriate, Effective, Safe, Accessible Loratadine '10mg'$ , 1 tablet once daily - Appropriate, Effective, Safe, Accessible -Medications previously tried: n/a  -Recommended to continue current medication  Restless legs syndrome (Goal: minimize symptoms) -Controlled -Current treatment  Ropinirole '2mg'$ , 1 tablet at bedtime - Appropriate, Effective, Safe, Accessible -Medications previously tried: none  -Recommended to continue current medication  Depression (Goal: minimize symptoms) -Controlled -Current treatment: Venlafaxine '75mg'$ , 1 tablet once daily - Appropriate, Effective, Safe, Accessible -Medications previously tried/failed: n/a -PHQ9: 0 -GAD7: n/a -Educated on Benefits of medication for symptom control Benefits of cognitive-behavioral therapy with or without medication -Recommended to continue current medication  Health Maintenance -Vaccine gaps: shingrix -Current therapy:  Florastor '250mg'$ , 1 capsule twice  Metamucil 3 capsules daily  Multivitamin 1 tablet daily Coenzyme Q10 300 mg capsules daily -Educated on Cost vs benefit of each product must be carefully weighed by individual consumer -Patient is satisfied with current therapy and denies issues -Counseled on limiting use of metamucil as this could be contributing to loose stools.  Patient Goals/Self-Care Activities Patient will:  - take medications as prescribed check blood pressure weekly, document, and provide at future appointments  Follow Up Plan: Telephone follow up appointment with care management team member scheduled for: 6 months       Patient verbalizes understanding of instructions and care plan provided today and agrees to view in [facility] MyChart status and patient understanding of how to access instructions and care plan via MyChart confirmed with patient.    Telephone follow up appointment with pharmacy team member scheduled for: 6  months  [facility]iona [facility]more [facility]

## 2021-07-05 ENCOUNTER — Other Ambulatory Visit: Payer: Self-pay | Admitting: Internal Medicine

## 2021-07-05 DIAGNOSIS — E038 Other specified hypothyroidism: Secondary | ICD-10-CM

## 2021-07-16 ENCOUNTER — Other Ambulatory Visit: Payer: Self-pay | Admitting: Cardiovascular Disease

## 2021-07-16 DIAGNOSIS — R0609 Other forms of dyspnea: Secondary | ICD-10-CM

## 2021-07-18 DIAGNOSIS — I110 Hypertensive heart disease with heart failure: Secondary | ICD-10-CM

## 2021-07-18 DIAGNOSIS — E785 Hyperlipidemia, unspecified: Secondary | ICD-10-CM

## 2021-07-18 DIAGNOSIS — I5030 Unspecified diastolic (congestive) heart failure: Secondary | ICD-10-CM | POA: Diagnosis not present

## 2021-08-04 ENCOUNTER — Other Ambulatory Visit: Payer: Self-pay | Admitting: Cardiovascular Disease

## 2021-08-09 ENCOUNTER — Other Ambulatory Visit: Payer: Self-pay | Admitting: Cardiovascular Disease

## 2021-08-09 ENCOUNTER — Other Ambulatory Visit: Payer: Self-pay | Admitting: Internal Medicine

## 2021-08-09 DIAGNOSIS — E538 Deficiency of other specified B group vitamins: Secondary | ICD-10-CM

## 2021-08-11 DIAGNOSIS — N35012 Post-traumatic membranous urethral stricture: Secondary | ICD-10-CM | POA: Diagnosis not present

## 2021-08-11 DIAGNOSIS — R3915 Urgency of urination: Secondary | ICD-10-CM | POA: Diagnosis not present

## 2021-08-11 DIAGNOSIS — R3912 Poor urinary stream: Secondary | ICD-10-CM | POA: Diagnosis not present

## 2021-08-27 ENCOUNTER — Ambulatory Visit: Payer: Self-pay

## 2021-08-27 NOTE — Patient Outreach (Signed)
  Care Coordination   08/27/2021 Name: [name] MRN: [id number] DOB: [birth year]   Care Coordination Outreach Attempts:  An unsuccessful telephone outreach was attempted today to offer the patient information about available care coordination services as a benefit of their health plan.   Follow Up Plan:  Additional outreach attempts will be made to offer the patient care coordination information and services.   Encounter Outcome:  No Answer  Care Coordination Interventions Activated:  No   Care Coordination Interventions:  No, not indicated    Daneen Schick, BSW, CDP Social Worker, Certified Dementia Practitioner Care Coordination [phone]

## 2021-09-04 ENCOUNTER — Ambulatory Visit (INDEPENDENT_AMBULATORY_CARE_PROVIDER_SITE_OTHER): Payer: Medicare Other | Admitting: Internal Medicine

## 2021-09-04 ENCOUNTER — Encounter: Payer: Self-pay | Admitting: Internal Medicine

## 2021-09-04 VITALS — BP 120/70 | HR 60 | Temp 98.1°F | Wt 173.9 lb

## 2021-09-04 DIAGNOSIS — G8929 Other chronic pain: Secondary | ICD-10-CM | POA: Diagnosis not present

## 2021-09-04 DIAGNOSIS — M25512 Pain in left shoulder: Secondary | ICD-10-CM

## 2021-09-04 DIAGNOSIS — E785 Hyperlipidemia, unspecified: Secondary | ICD-10-CM | POA: Diagnosis not present

## 2021-09-04 DIAGNOSIS — M25511 Pain in right shoulder: Secondary | ICD-10-CM | POA: Diagnosis not present

## 2021-09-04 DIAGNOSIS — G894 Chronic pain syndrome: Secondary | ICD-10-CM

## 2021-09-04 DIAGNOSIS — E1169 Type 2 diabetes mellitus with other specified complication: Secondary | ICD-10-CM | POA: Diagnosis not present

## 2021-09-04 LAB — POCT GLYCOSYLATED HEMOGLOBIN (HGB A1C): Hemoglobin A1C: 6.1 % — AB (ref 4.0–5.6)

## 2021-09-04 MED ORDER — OXYCODONE-ACETAMINOPHEN 10-325 MG PO TABS: 1.0000 | ORAL_TABLET | Freq: Two times a day (BID) | ORAL | 0 refills | Status: DC | PRN

## 2021-09-04 MED ORDER — OXYCODONE-ACETAMINOPHEN 10-325 MG PO TABS: 1.0000 | ORAL_TABLET | Freq: Two times a day (BID) | ORAL | 0 refills | Status: DC

## 2021-09-04 NOTE — Progress Notes (Signed)
Established Patient Office Visit     CC/Reason for Visit: Follow-up chronic medical conditions  HPI: [name] is a 81 y.o. male who is coming in today for the above mentioned reasons. Past Medical History is significant for: Type 2 diabetes that has been well controlled, heart failure with preserved ejection fraction, chronic pain syndrome with multiple joint pain including neck, shoulders, hands, hips, knees.  He has a history of ascending cholangitis and pancreatitis due to biliary strictures, choledocholithiasis followed by Dr. Rush Landmark.  He is on a chronic pain contract with me for oxycodone for which he takes half a tablet 3 times a day.  His main complaint continues to be diffuse joint achiness.  It seems to be concentrated around his shoulders this time.  He states that a few years ago he had a steroid injection into his left shoulder and had about 3 years of pain relief.  Is wondering if that is an option again.  He has seen a rheumatologist without diagnosis.   Past Medical/Surgical History: Past Medical History:  Diagnosis Date   Allergy    B12 deficiency anemia    Blood transfusion without reported diagnosis    CAD (coronary artery disease)    Cancer (HCC)    bladder-    Colon polyps    COPD (chronic obstructive pulmonary disease) (HCC)    Depression    Diabetes mellitus (HCC)    Esophagus, Barrett's    GERD (gastroesophageal reflux disease)    History of bladder cancer    Bladder cancer "8 times"   History of hiatal hernia    Hyperlipidemia    Hypertension    Localized osteoarthrosis, lower leg    Myocardial infarction ([place]) 2021   Restless leg syndrome    Sleep apnea    does not wear cpap   Stenosis of esophagus     Past Surgical History:  Procedure Laterality Date   BILIARY BRUSHING  04/01/2018   Procedure: BILIARY BRUSHING;  Surgeon: Irving Copas., MD;  Location: [facility] ENDOSCOPY;  Service: Gastroenterology;;   BILIARY BRUSHING   09/12/2018   Procedure: BILIARY BRUSHING;  Surgeon: Irving Copas., MD;  Location: [facility] ENDOSCOPY;  Service: Gastroenterology;;   BILIARY BRUSHING  11/28/2018   Procedure: BILIARY BRUSHING;  Surgeon: Irving Copas., MD;  Location: [place];  Service: Gastroenterology;;   BILIARY BRUSHING  03/11/2020   Procedure: BILIARY BRUSHING;  Surgeon: Irving Copas., MD;  Location: [facility];  Service: Gastroenterology;;   BILIARY DILATION  09/12/2018   Procedure: BILIARY DILATION;  Surgeon: Irving Copas., MD;  Location: [place];  Service: Gastroenterology;;   BILIARY DILATION  11/28/2018   Procedure: BILIARY DILATION;  Surgeon: Irving Copas., MD;  Location: [place];  Service: Gastroenterology;;   BILIARY DILATION  03/11/2020   Procedure: BILIARY DILATION;  Surgeon: Irving Copas., MD;  Location: [facility];  Service: Gastroenterology;;   BILIARY STENT PLACEMENT  04/01/2018   Procedure: BILIARY STENT PLACEMENT;  Surgeon: Irving Copas., MD;  Location: [place];  Service: Gastroenterology;;   BILIARY STENT PLACEMENT  09/12/2018   Procedure: BILIARY STENT PLACEMENT;  Surgeon: Irving Copas., MD;  Location: [place];  Service: Gastroenterology;;   BILIARY STENT PLACEMENT  11/28/2018   Procedure: BILIARY STENT PLACEMENT;  Surgeon: Irving Copas., MD;  Location: [place];  Service: Gastroenterology;;   BIOPSY  04/01/2018   Procedure: BIOPSY;  Surgeon: Irving Copas., MD;  Location: [facility] ENDOSCOPY;  Service: Gastroenterology;;  BIOPSY  09/12/2018   Procedure: BIOPSY;  Surgeon: Rush Landmark Telford Nab., MD;  Location: [place];  Service: Gastroenterology;;   BIOPSY  11/28/2018   Procedure: BIOPSY;  Surgeon: Irving Copas., MD;  Location: [place];  Service: Gastroenterology;;   BIOPSY  03/11/2020   Procedure: BIOPSY;  Surgeon: Irving Copas., MD;  Location: [facility] ENDOSCOPY;  Service:  Gastroenterology;;   bladder cancer      x 8 cystoscopy   CERVICAL DISCECTOMY     ACDF   COLONOSCOPY  11/17/2005   normal    CORONARY ARTERY BYPASS GRAFT     x4   CORONARY STENT INTERVENTION N/A 01/05/2019   Procedure: CORONARY STENT INTERVENTION;  Surgeon: Nelva Bush, MD;  Location: [place] CV LAB;  Service: Cardiovascular;  Laterality: N/A;   ENDOSCOPIC MUCOSAL RESECTION  09/12/2018   Procedure: ENDOSCOPIC MUCOSAL RESECTION;  Surgeon: Rush Landmark, Telford Nab., MD;  Location: [facility] ENDOSCOPY;  Service: Gastroenterology;;   ENDOSCOPIC RETROGRADE CHOLANGIOPANCREATOGRAPHY (ERCP) WITH PROPOFOL N/A 04/01/2018   Procedure: ENDOSCOPIC RETROGRADE CHOLANGIOPANCREATOGRAPHY (ERCP) WITH PROPOFOL;  Surgeon: Irving Copas., MD;  Location: [place];  Service: Gastroenterology;  Laterality: N/A;   ENDOSCOPIC RETROGRADE CHOLANGIOPANCREATOGRAPHY (ERCP) WITH PROPOFOL N/A 09/12/2018   Procedure: ENDOSCOPIC RETROGRADE CHOLANGIOPANCREATOGRAPHY (ERCP) WITH PROPOFOL;  Surgeon: Rush Landmark Telford Nab., MD;  Location: [place];  Service: Gastroenterology;  Laterality: N/A;   ENDOSCOPIC RETROGRADE CHOLANGIOPANCREATOGRAPHY (ERCP) WITH PROPOFOL N/A 03/11/2020   Procedure: ENDOSCOPIC RETROGRADE CHOLANGIOPANCREATOGRAPHY (ERCP) WITH PROPOFOL;  Surgeon: Rush Landmark Telford Nab., MD;  Location: [facility] ENDOSCOPY;  Service: Gastroenterology;  Laterality: N/A;   ENDOSCOPIC RETROGRADE CHOLANGIOPANCREATOGRAPHY (ERCP) WITH PROPOFOL N/A 06/03/2020   Procedure: ENDOSCOPIC RETROGRADE CHOLANGIOPANCREATOGRAPHY (ERCP) WITH PROPOFOL;  Surgeon: Rush Landmark Telford Nab., MD;  Location: [facility] ENDOSCOPY;  Service: Gastroenterology;  Laterality: N/A;   ERCP N/A 11/28/2018   Procedure: ENDOSCOPIC RETROGRADE CHOLANGIOPANCREATOGRAPHY (ERCP) +EGD with spyglass;  Surgeon: Rush Landmark Telford Nab., MD;  Location: [place];  Service: Gastroenterology;  Laterality: N/A;   ESOPHAGOGASTRODUODENOSCOPY  04/29/2010   ESOPHAGOGASTRODUODENOSCOPY  (EGD) WITH PROPOFOL N/A 04/01/2018   Procedure: ESOPHAGOGASTRODUODENOSCOPY (EGD) WITH PROPOFOL;  Surgeon: Rush Landmark Telford Nab., MD;  Location: [place];  Service: Gastroenterology;  Laterality: N/A;   ESOPHAGOGASTRODUODENOSCOPY (EGD) WITH PROPOFOL N/A 09/12/2018   Procedure: ESOPHAGOGASTRODUODENOSCOPY (EGD) WITH PROPOFOL;  Surgeon: Rush Landmark Telford Nab., MD;  Location: [place];  Service: Gastroenterology;  Laterality: N/A;   ESOPHAGOGASTRODUODENOSCOPY (EGD) WITH PROPOFOL N/A 11/28/2018   Procedure: ESOPHAGOGASTRODUODENOSCOPY (EGD) WITH PROPOFOL;  Surgeon: Rush Landmark Telford Nab., MD;  Location: [place];  Service: Gastroenterology;  Laterality: N/A;   ESOPHAGOGASTRODUODENOSCOPY (EGD) WITH PROPOFOL N/A 03/11/2020   Procedure: ESOPHAGOGASTRODUODENOSCOPY (EGD) WITH PROPOFOL;  Surgeon: Rush Landmark Telford Nab., MD;  Location: [facility] ENDOSCOPY;  Service: Gastroenterology;  Laterality: N/A;   ESOPHAGOGASTRODUODENOSCOPY (EGD) WITH PROPOFOL N/A 06/03/2020   Procedure: ESOPHAGOGASTRODUODENOSCOPY (EGD) WITH PROPOFOL;  Surgeon: Rush Landmark Telford Nab., MD;  Location: [facility] ENDOS[facility];  Service: Gastroenterology;  Laterality: N/A;   EUS  04/01/2018   Procedure: FULL UPPER ENDOSCOPIC ULTRASOUND (EUS) RADIAL;  Surgeon: Irving Copas., MD;  Location: [place];  Service: Gastroenterology;;   EUS N/A 09/12/2018   Procedure: UPPER ENDOSCOPIC ULTRASOUND (EUS) RADIAL;  Surgeon: Irving Copas., MD;  Location: [place];  Service: Gastroenterology;  Laterality: N/A;   FINE NEEDLE ASPIRATION  09/12/2018   Procedure: FINE NEEDLE ASPIRATION (FNA) LINEAR;  Surgeon: Irving Copas., MD;  Location: [facility] ENDOSCOPY;  Service: Gastroenterology;;   HAND SURGERY Left 2018   saw accident   HEMOSTASIS CLIP [place]  09/12/2018   Procedure: HEMOSTASIS CLIP  PLACEMENT;  Surgeon: Rush Landmark Telford Nab., MD;  Location: [place];  Service: Gastroenterology;;   HEMOSTASIS CLIP PLACEMENT  06/03/2020    Procedure: HEMOSTASIS CLIP PLACEMENT;  Surgeon: Irving Copas., MD;  Location: [facility];  Service: Gastroenterology;;   I & D EXTREMITY Left 11/24/2016   Procedure: IRRIGATION AND DEBRIDEMENT LEFT HAND, THUMB, INDEX, MIDDLE, RING, AND SMALL FINGERS WITH RECONSTRUCTION;  Surgeon: Roseanne Kaufman, MD;  Location: [place];  Service: Orthopedics;  Laterality: Left;   KNEE ARTHROSCOPY Left    LEFT HEART CATH AND CORS/GRAFTS ANGIOGRAPHY N/A 01/05/2019   Procedure: LEFT HEART CATH AND CORS/GRAFTS ANGIOGRAPHY;  Surgeon: Nelva Bush, MD;  Location: [place] CV [facility];  Service: Cardiovascular;  Laterality: N/A;   LUMBAR LAMINECTOMY     and fusion x 2   NASAL SINUS SURGERY     POPLITEAL SYNOVIAL CYST EXCISION     REMOVAL OF STONES  04/01/2018   Procedure: REMOVAL OF STONES;  Surgeon: Rush Landmark Telford Nab., MD;  Location: [place];  Service: Gastroenterology;;   REMOVAL OF STONES  09/12/2018   Procedure: REMOVAL OF STONES;  Surgeon: Irving Copas., MD;  Location: [place];  Service: Gastroenterology;;   REMOVAL OF STONES  11/28/2018   Procedure: REMOVAL OF STONES;  Surgeon: Irving Copas., MD;  Location: [place];  Service: Gastroenterology;;   REMOVAL OF STONES  03/11/2020   Procedure: REMOVAL OF STONES;  Surgeon: Irving Copas., MD;  Location: [facility];  Service: Gastroenterology;;   REMOVAL OF STONES  06/03/2020   Procedure: REMOVAL OF STONES;  Surgeon: Irving Copas., MD;  Location: [facility];  Service: Gastroenterology;;   Azzie Almas DILATION N/A 09/12/2018   Procedure: Renato Battles;  Surgeon: Irving Copas., MD;  Location: [place];  Service: Gastroenterology;  Laterality: N/A;   SAVORY DILATION N/A 11/28/2018   Procedure: SAVORY DILATION;  Surgeon: Rush Landmark Telford Nab., MD;  Location: [place];  Service: Gastroenterology;  Laterality: N/A;   SEPTOPLASTY Bilateral 05/26/2021   Procedure: SEPTOPLASTY;  Surgeon: Leta Baptist, MD;  Location: [place];  Service: ENT;  Laterality: Bilateral;   SPHINCTEROTOMY  04/01/2018   Procedure: Joan Mayans;  Surgeon: Mansouraty, Telford Nab., MD;  Location: [place];  Service: Gastroenterology;;   Bess Kinds CHOLANGIOSCOPY N/A 11/28/2018   Procedure: GYIRSWNI CHOLANGIOSCOPY;  Surgeon: Irving Copas., MD;  Location: [place];  Service: Gastroenterology;  Laterality: N/A;   SPYGLASS CHOLANGIOSCOPY N/A 06/03/2020   Procedure: SPYGLASS CHOLANGIOSCOPY;  Surgeon: Irving Copas., MD;  Location: [facility];  Service: Gastroenterology;  Laterality: N/A;   STENT REMOVAL  09/12/2018   Procedure: STENT REMOVAL;  Surgeon: Irving Copas., MD;  Location: [place];  Service: Gastroenterology;;   Lavell Islam REMOVAL  11/28/2018   Procedure: STENT REMOVAL;  Surgeon: Irving Copas., MD;  Location: [place];  Service: Gastroenterology;;   Lavell Islam REMOVAL  03/11/2020   Procedure: STENT REMOVAL;  Surgeon: Irving Copas., MD;  Location: [facility];  Service: Gastroenterology;;   SUBMUCOSAL LIFTING INJECTION  09/12/2018   Procedure: SUBMUCOSAL LIFTING INJECTION;  Surgeon: Irving Copas., MD;  Location: [place];  Service: Gastroenterology;;    Social History:  reports that he quit smoking about 45 years ago. His smoking use included cigarettes. He has a 2.50 pack-year smoking history. He has never used smokeless tobacco. He reports that he does not drink alcohol and does not use drugs.  Allergies: No Active Allergies  Family History:  Family History  Problem Relation Age of Onset  Melanoma Mother    Stroke Father    Hypertension Father    Coronary artery disease Other    Colon cancer Neg Hx    Esophageal cancer Neg Hx    Stomach cancer Neg Hx    Rectal cancer Neg Hx    Pancreatic cancer Neg Hx    Liver disease Neg Hx    Inflammatory bowel disease Neg Hx      Current Outpatient Medications:     acetaminophen (TYLENOL) 500 MG tablet, Take 1,000 mg by mouth every 6 (six) hours as needed for moderate pain., Disp: , Rfl:    amLODipine (NORVASC) 2.5 MG tablet, Take 2 tablets by mouth once daily, Disp: 90 tablet, Rfl: 2   cetirizine (ZYRTEC) 10 MG tablet, Take 10 mg by mouth daily as needed for allergies., Disp: , Rfl:    Coenzyme Q10 300 MG CAPS, Take 300 mg by mouth every evening., Disp: , Rfl:    cyanocobalamin (,VITAMIN B-12,) 1000 MCG/ML injection, INJECT 1 ML  SUBCUTANEOUSLY ONCE EVERY MONTH, Disp: 3 mL, Rfl: 0   dextromethorphan-guaiFENesin (MUCINEX DM) 30-600 MG 12hr tablet, Take 1 tablet by mouth 2 (two) times daily., Disp: , Rfl:    ezetimibe (ZETIA) 10 MG tablet, Take 1 tablet by mouth once daily, Disp: 90 tablet, Rfl: 1   Ferrous Sulfate (IRON) 28 MG TABS, Take 28 mg by mouth daily., Disp: , Rfl:    furosemide (LASIX) 40 MG tablet, Take 1 tablet (40 mg total) by mouth every other day. (Patient taking differently: Take 40 mg by mouth daily as needed for fluid or edema.), Disp: 90 tablet, Rfl: 1   guaifenesin (HUMIBID E) 400 MG TABS tablet, Take 400 mg by mouth daily., Disp: , Rfl:    hyoscyamine (LEVSIN SL) 0.125 MG SL tablet, Place 1 tablet (0.125 mg total) under the tongue every 4 (four) hours as needed. (Patient taking differently: Place 0.125 mg under the tongue every 4 (four) hours as needed (spasms).), Disp: 30 tablet, Rfl: 2   isosorbide mononitrate (IMDUR) 60 MG 24 hr tablet, Take 1 tablet by mouth once daily, Disp: 90 tablet, Rfl: 0   Menthol, Topical Analgesic, (BENGAY EX), Apply 1 application topically daily as needed (pain)., Disp: , Rfl:    METAMUCIL FIBER PO, Take 3 capsules by mouth daily., Disp: , Rfl:    Multiple Vitamins-Minerals (MULTIVITAMIN WITH MINERALS) tablet, Take 1 tablet by mouth daily., Disp: , Rfl:    Naphazoline-Pheniramine (OPCON-A) 0.027-0.315 % SOLN, Place 1 drop into both eyes daily as needed (itching eyes)., Disp: , Rfl:    nebivolol (BYSTOLIC) 10 MG  tablet, Take 1 tablet by mouth once daily, Disp: 90 tablet, Rfl: 2   NEEDLE, DISP, 25 G (B-D DISP NEEDLE 25GX1") 25G X 1" MISC, Inject 1000 mcg into muscle once a month., Disp: 50 each, Rfl: 0   nitroGLYCERIN (NITROSTAT) 0.4 MG SL tablet, PLACE 1 TABLET UNDER THE TONGUE EVERY 5 MINUTES AS NEEDED FOR CHEST PAIN, Disp: 25 tablet, Rfl: 2   Probiotic Product (PROBIOTIC ADVANCED PO), Take 1 tablet by mouth every evening., Disp: , Rfl:    rOPINIRole (REQUIP) 2 MG tablet, TAKE 1 TABLET BY MOUTH AT BEDTIME, Disp: 90 tablet, Rfl: 0   rosuvastatin (CRESTOR) 10 MG tablet, Take 1 tablet by mouth once daily, Disp: 90 tablet, Rfl: 2   venlafaxine (EFFEXOR) 75 MG tablet, Take 1 tablet by mouth once daily, Disp: 90 tablet, Rfl: 1   oxyCODONE-acetaminophen (PERCOCET) 10-325 MG tablet, Take 1 tablet by  mouth 2 (two) times daily., Disp: 60 tablet, Rfl: 0   oxyCODONE-acetaminophen (PERCOCET) 10-325 MG tablet, Take 1 tablet by mouth 2 (two) times daily as needed for pain., Disp: 60 tablet, Rfl: 0   oxyCODONE-acetaminophen (PERCOCET) 10-325 MG tablet, Take 1 tablet by mouth 2 (two) times daily as needed for pain., Disp: 60 tablet, Rfl: 0   pantoprazole (PROTONIX) 40 MG tablet, Take 1 tablet (40 mg total) by mouth daily., Disp: 90 tablet, Rfl: 3  Review of Systems:  Constitutional: Denies fever, chills, diaphoresis, appetite change and fatigue.  HEENT: Denies photophobia, eye pain, redness, hearing loss, ear pain, congestion, sore throat, rhinorrhea, sneezing, mouth sores, trouble swallowing, neck pain, neck stiffness and tinnitus.   Respiratory: Denies SOB, DOE, cough, chest tightness,  and wheezing.   Cardiovascular: Denies chest pain, palpitations and leg swelling.  Gastrointestinal: Denies nausea, vomiting, abdominal pain, diarrhea, constipation, blood in stool and abdominal distention.  Genitourinary: Denies dysuria, urgency, frequency, hematuria, flank pain and difficulty urinating.  Endocrine: Denies: hot or cold  intolerance, sweats, changes in hair or nails, polyuria, polydipsia. Musculoskeletal: Positive for myalgias, back pain, joint swelling, arthralgias and gait problem.  Skin: Denies pallor, rash and wound.  Neurological: Denies dizziness, seizures, syncope, weakness, light-headedness, numbness and headaches.  Hematological: Denies adenopathy. Easy bruising, personal or family bleeding history  Psychiatric/Behavioral: Denies suicidal ideation, mood changes, confusion, nervousness, sleep disturbance and agitation    Physical Exam: Vitals:   09/04/21 0925  BP: 120/70  Pulse: 60  Temp: 98.1 F (36.7 C)  TempSrc: Oral  SpO2: 95%  Weight: 173 lb 14.4 oz (78.9 kg)    Body mass index is 25.68 kg/m.   Constitutional: NAD, calm, comfortable Eyes: PERRL, lids and conjunctivae normal ENMT: Mucous membranes are moist.  Respiratory: clear to auscultation bilaterally, no wheezing, no crackles. Normal respiratory effort. No accessory muscle use.  Cardiovascular: Regular rate and rhythm, no murmurs / rubs / gallops. No extremity edema.  Psychiatric: Normal judgment and insight. Alert and oriented x 3. Normal mood.    Impression and Plan:  Type 2 diabetes mellitus with other specified complication, without long-term current use of insulin ([place])  - Plan: POCT glycosylated hemoglobin (Hb A1C) -Well-controlled with an A1c of 6.1.  Chronic pain syndrome  -PDMP reviewed, no red flags, overdose risk score is 50. -Refill oxycodone 10 mg of which she takes half a tablet 4 times a day for total of 60 tablets a month x3 months.  Hyperlipidemia, unspecified hyperlipidemia type -Last cholesterol panel in November 2022 with a total cholesterol of 118, triglycerides 104 and LDL 62.  Chronic pain of both shoulders  - Plan: Ambulatory referral to Orthopedic Surgery    Time spent:33 minutes reviewing chart, interviewing and examining patient and formulating plan of care.    [name],  MD [facility]

## 2021-09-08 ENCOUNTER — Other Ambulatory Visit: Payer: Self-pay | Admitting: *Deleted

## 2021-09-08 NOTE — Patient Outreach (Signed)
  Care Coordination   09/08/2021 Name: [name] MRN: [id number] DOB: Dec 15, 1940   Care Coordination Outreach Attempts:  A second unsuccessful outreach was attempted today to offer the patient with information about available care coordination services as a benefit of their health plan.     Follow Up Plan:  Additional outreach attempts will be made to offer the patient care coordination information and services.   Encounter Outcome:  No Answer  Care Coordination Interventions Activated:  No   Care Coordination Interventions:  No, not indicated    Raina Mina, RN Care Management Coordinator [place] Office [phone]

## 2021-09-17 ENCOUNTER — Ambulatory Visit: Payer: Self-pay

## 2021-09-17 NOTE — Patient Outreach (Signed)
  Care Coordination   09/17/2021 Name: WITTEN CERTAIN MRN: [id number] DOB: 09-22-1940   Care Coordination Outreach Attempts:  A third unsuccessful outreach was attempted today to offer the patient with information about available care coordination services as a benefit of their health plan.   Follow Up Plan:  No further outreach attempts will be made at this time. We have been unable to contact the patient to offer or enroll patient in care coordination services  Encounter Outcome:  No Answer  Care Coordination Interventions Activated:  No   Care Coordination Interventions:  No, not indicated    Daneen Schick, BSW, CDP Social Worker, Certified Dementia Practitioner Care Coordination [phone]

## 2021-10-01 ENCOUNTER — Other Ambulatory Visit: Payer: Self-pay | Admitting: *Deleted

## 2021-10-01 DIAGNOSIS — E038 Other specified hypothyroidism: Secondary | ICD-10-CM

## 2021-10-01 MED ORDER — EZETIMIBE 10 MG PO TABS: 10.0000 mg | ORAL_TABLET | Freq: Every day | ORAL | 1 refills | Status: DC

## 2021-10-01 MED ORDER — VENLAFAXINE HCL 75 MG PO TABS: 75.0000 mg | ORAL_TABLET | Freq: Every day | ORAL | 1 refills | Status: DC

## 2021-10-06 ENCOUNTER — Telehealth: Payer: Self-pay | Admitting: Cardiovascular Disease

## 2021-10-06 DIAGNOSIS — I5033 Acute on chronic diastolic (congestive) heart failure: Secondary | ICD-10-CM

## 2021-10-06 MED ORDER — FUROSEMIDE 40 MG PO TABS: 40.0000 mg | ORAL_TABLET | ORAL | 0 refills | Status: DC

## 2021-10-06 MED ORDER — EZETIMIBE 10 MG PO TABS: 10.0000 mg | ORAL_TABLET | Freq: Every day | ORAL | 0 refills | Status: DC

## 2021-10-06 MED ORDER — ISOSORBIDE MONONITRATE ER 60 MG PO TB24: 60.0000 mg | ORAL_TABLET | Freq: Every day | ORAL | 0 refills | Status: DC

## 2021-10-06 MED ORDER — ROSUVASTATIN CALCIUM 10 MG PO TABS: 10.0000 mg | ORAL_TABLET | Freq: Every day | ORAL | 0 refills | Status: DC

## 2021-10-06 MED ORDER — AMLODIPINE BESYLATE 2.5 MG PO TABS: 5.0000 mg | ORAL_TABLET | Freq: Every day | ORAL | 0 refills | Status: DC

## 2021-10-06 NOTE — Telephone Encounter (Signed)
*  STAT* If patient is at the pharmacy, call can be transferred to refill team.   1. Which medications need to be refilled? (please list name of each medication and dose if known) all medications, daughter does not know the names and just requested all of them  2. Which pharmacy/location (including street and city if local pharmacy) is medication to be sent to? [facility]  3. Do they need a 30 day or 90 day supply? 90 day  Patient's daughter says all refills are to go to [facility].

## 2021-10-07 ENCOUNTER — Telehealth: Payer: Self-pay | Admitting: Pharmacist

## 2021-10-07 NOTE — Chronic Care Management (AMB) (Signed)
Chronic Care Management Pharmacy Assistant   Name: [name]  MRN: [id number] DOB: 07-11-1940  Reason for Encounter: Disease State / Hypertension Assessment Call   Conditions to be addressed/monitored: HTN  Recent office visits:  09/04/2021 [name] MD - Patient was seen for Type 2 diabetes mellitus with other specified complication, without long-term current use of insulin and additional concerns. No medication changes. No follow up noted.   Recent consult visits:  None  Hospital visits:  None  Medications: Outpatient Encounter Medications as of 10/07/2021  Medication Sig   acetaminophen (TYLENOL) 500 MG tablet Take 1,000 mg by mouth every 6 (six) hours as needed for moderate pain.   amLODipine (NORVASC) 2.5 MG tablet Take 2 tablets (5 mg total) by mouth daily. KEEP UPCOMING APPOINTMENT FOR FUTURE REFILLS.   cetirizine (ZYRTEC) 10 MG tablet Take 10 mg by mouth daily as needed for allergies.   Coenzyme Q10 300 MG CAPS Take 300 mg by mouth every evening.   cyanocobalamin (,VITAMIN B-12,) 1000 MCG/ML injection INJECT 1 ML  SUBCUTANEOUSLY ONCE EVERY MONTH   dextromethorphan-guaiFENesin (MUCINEX DM) 30-600 MG 12hr tablet Take 1 tablet by mouth 2 (two) times daily.   ezetimibe (ZETIA) 10 MG tablet Take 1 tablet (10 mg total) by mouth daily. KEEP UPCOMING APPOINTMENT FOR FUTURE REFILLS.   Ferrous Sulfate (IRON) 28 MG TABS Take 28 mg by mouth daily.   furosemide (LASIX) 40 MG tablet Take 1 tablet (40 mg total) by mouth every other day. KEEP UPCOMING APPOINTMENT FOR FUTURE REFILLS.   guaifenesin (HUMIBID E) 400 MG TABS tablet Take 400 mg by mouth daily.   hyoscyamine (LEVSIN SL) 0.125 MG SL tablet Place 1 tablet (0.125 mg total) under the tongue every 4 (four) hours as needed. (Patient taking differently: Place 0.125 mg under the tongue every 4 (four) hours as needed (spasms).)   isosorbide mononitrate (IMDUR) 60 MG 24 hr tablet Take 1 tablet (60 mg total) by mouth  daily. KEEP UPCOMING APPOINTMENT FOR FUTURE REFILLS.   Menthol, Topical Analgesic, (BENGAY EX) Apply 1 application topically daily as needed (pain).   METAMUCIL FIBER PO Take 3 capsules by mouth daily.   Multiple Vitamins-Minerals (MULTIVITAMIN WITH MINERALS) tablet Take 1 tablet by mouth daily.   Naphazoline-Pheniramine (OPCON-A) 0.027-0.315 % SOLN Place 1 drop into both eyes daily as needed (itching eyes).   nebivolol (BYSTOLIC) 10 MG tablet Take 1 tablet by mouth once daily   NEEDLE, DISP, 25 G (B-D DISP NEEDLE 25GX1") 25G X 1" MISC Inject 1000 mcg into muscle once a month.   nitroGLYCERIN (NITROSTAT) 0.4 MG SL tablet PLACE 1 TABLET UNDER THE TONGUE EVERY 5 MINUTES AS NEEDED FOR CHEST PAIN   oxyCODONE-acetaminophen (PERCOCET) 10-325 MG tablet Take 1 tablet by mouth 2 (two) times daily.   oxyCODONE-acetaminophen (PERCOCET) 10-325 MG tablet Take 1 tablet by mouth 2 (two) times daily as needed for pain.   oxyCODONE-acetaminophen (PERCOCET) 10-325 MG tablet Take 1 tablet by mouth 2 (two) times daily as needed for pain.   pantoprazole (PROTONIX) 40 MG tablet Take 1 tablet (40 mg total) by mouth daily.   Probiotic Product (PROBIOTIC ADVANCED PO) Take 1 tablet by mouth every evening.   rOPINIRole (REQUIP) 2 MG tablet TAKE 1 TABLET BY MOUTH AT BEDTIME   rosuvastatin (CRESTOR) 10 MG tablet Take 1 tablet (10 mg total) by mouth daily. KEEP UPCOMING APPOINTMENT FOR FUTURE REFILLS.   venlafaxine (EFFEXOR) 75 MG tablet Take 1 tablet (75 mg total) by mouth daily.  No facility-administered encounter medications on file as of 10/07/2021.  Fill History: AMLODIPINE 2.'5MG'$  TAB 09/18/2021 45   EZETIMIBE  10 MG TABS 10/01/2021 100   FUROSEMIDE  40 MG TABS 10/06/2021 30   HYOSCYAMINE 0.'125MG'$  SUB 05/17/2020 5   NEBIVOLOL '10MG'$       TAB 07/17/2021 90   NITROGLYCER 0.'4MG'$    SUB 07/18/2021 25   OXYCOD/ACETAM 10-'325MG'$  TAB 09/28/2021 30   PANTOPRAZOLE '40MG'$  TAB 07/31/2021 90   ROPINIROLE '2MG'$  TAB 08/11/2021 90    ROSUVASTATIN '10MG'$  TAB 07/17/2021 90   VENLAFAXINE HCL  75 MG TABS 10/01/2021 90   Reviewed chart prior to disease state call. Spoke with patient regarding BP  Recent Office Vitals: BP Readings from Last 3 Encounters:  09/04/21 120/70  06/02/21 130/70  05/26/21 116/61   Pulse Readings from Last 3 Encounters:  09/04/21 60  06/02/21 60  05/26/21 64    Wt Readings from Last 3 Encounters:  09/04/21 173 lb 14.4 oz (78.9 kg)  06/02/21 173 lb 1.6 oz (78.5 kg)  05/26/21 172 lb 13.5 oz (78.4 kg)     Kidney Function Lab Results  Component Value Date/Time   CREATININE 1.47 (H) 05/20/2021 12:33 PM   CREATININE 1.42 (H) 12/09/2020 08:53 AM   CREATININE 1.61 (H) 11/09/2019 09:39 AM   CREATININE 1.17 12/26/2015 10:34 AM   GFR 41.03 (L) 07/27/2018 04:18 PM   GFRNONAA 48 (L) 05/20/2021 12:33 PM   GFRAA 47 (L) 11/13/2019 10:06 AM       Latest Ref Rng & Units 05/20/2021   12:33 PM 12/09/2020    8:53 AM 04/09/2020   11:01 AM  BMP  Glucose 70 - 99 mg/dL 151  113    BUN 8 - 23 mg/dL 20  21    Creatinine 0.61 - 1.24 mg/dL 1.47  1.42  1.40   BUN/Creat Ratio 10 - 24  15    Sodium 135 - 145 mmol/L 140  144    Potassium 3.5 - 5.1 mmol/L 4.7  4.8    Chloride 98 - 111 mmol/L 107  104    CO2 22 - 32 mmol/L 28  27    Calcium 8.9 - 10.3 mg/dL 8.9  9.2      Current antihypertensive regimen:  Isosorbide 60 mg daily  How often are you checking your Blood Pressure?   Current home BP readings:   What recent interventions/DTPs have been made by any provider to improve Blood Pressure control since last CPP Visit: No recent interventions.  Any recent hospitalizations or ED visits since last visit with CPP? No recent hospital visits.   What diet changes have been made to improve Blood Pressure Control?  Patient follows [place] exercise is being done to improve your Blood Pressure Control?    Adherence Review: Is the patient currently on ACE/ARB  medication? No Does the patient have >5 day gap between last estimated fill dates? No  Care Gaps: AWV - 10/07/2021 message sent to [name]  Last BP - 120/70 on 09/04/2021 Last A1C - 6.1 on 09/04/2021 Foot exam - never done Eye exam - never done Hep C Screen - never done Covid booster - overdue Shingrix - postponed Flu - postponed  Star Rating Drugs: Rosuvastatin 10 mg - last filled 07/17/2021 90 DS at [facility] [phone]

## 2021-10-16 ENCOUNTER — Encounter: Payer: Self-pay | Admitting: Internal Medicine

## 2021-10-16 ENCOUNTER — Ambulatory Visit (INDEPENDENT_AMBULATORY_CARE_PROVIDER_SITE_OTHER): Payer: Medicare Other | Admitting: Internal Medicine

## 2021-10-16 VITALS — BP 120/88 | HR 61 | Temp 98.5°F | Resp 18 | Ht 69.0 in | Wt 162.2 lb

## 2021-10-16 DIAGNOSIS — R1319 Other dysphagia: Secondary | ICD-10-CM | POA: Diagnosis not present

## 2021-10-16 DIAGNOSIS — Z23 Encounter for immunization: Secondary | ICD-10-CM | POA: Diagnosis not present

## 2021-10-16 DIAGNOSIS — K6289 Other specified diseases of anus and rectum: Secondary | ICD-10-CM | POA: Diagnosis not present

## 2021-10-16 NOTE — Progress Notes (Signed)
Established Patient Office Visit     CC/Reason for Visit: "I think I have rectal cancer"  HPI: [name] is a 81 y.o. male who is coming in today for the above mentioned reasons. Past Medical History is significant for: Type 2 diabetes, chronic pain syndrome on oxycodone, heart failure with preserved ejection fraction.  He saw an announcement on TV last week that makes him think he has colorectal cancer.  He states he has been having the same symptoms that were described on TV.  He has been having rectal pain, states he inserted a finger into his anal verge and it is "no longer smooth".  On a couple of occasions he has noted some red blood with stools.  He has had some weight loss over the last few years and thinks this could be a symptom as well.  Of note he has chronic pain syndrome and has a signed pain contract with me for oxycodone 10 mg that he gets 60 a month.  He has not increased his usage.  He has been very compliant with the terms of his pain management contract.  He saw his urologist recently as he has a history of bladder cancer.  The urologist did a digital rectal exam and patient was told that he did not have any hemorrhoids.  He had a cystoscopy that was apparently clear per patient report.  He has also been having dysphagia and odynophagia.  States that food will get stuck in the center of his chest causing him to have to induce vomiting to expel it.   Past Medical/Surgical History: Past Medical History:  Diagnosis Date   Allergy    B12 deficiency anemia    Blood transfusion without reported diagnosis    CAD (coronary artery disease)    Cancer (HCC)    bladder-    Colon polyps    COPD (chronic obstructive pulmonary disease) (HCC)    Depression    Diabetes mellitus (HCC)    Esophagus, Barrett's    GERD (gastroesophageal reflux disease)    History of bladder cancer    Bladder cancer "8 times"   History of hiatal hernia    Hyperlipidemia    Hypertension     Localized osteoarthrosis, lower leg    Myocardial infarction ([place]) 2021   Restless leg syndrome    Sleep apnea    does not wear cpap   Stenosis of esophagus     Past Surgical History:  Procedure Laterality Date   BILIARY BRUSHING  04/01/2018   Procedure: BILIARY BRUSHING;  Surgeon: Irving Copas., MD;  Location: [facility] ENDOSCOPY;  Service: Gastroenterology;;   BILIARY BRUSHING  09/12/2018   Procedure: BILIARY BRUSHING;  Surgeon: Irving Copas., MD;  Location: [facility] ENDOSCOPY;  Service: Gastroenterology;;   BILIARY BRUSHING  11/28/2018   Procedure: BILIARY BRUSHING;  Surgeon: Irving Copas., MD;  Location: [place];  Service: Gastroenterology;;   BILIARY BRUSHING  03/11/2020   Procedure: BILIARY BRUSHING;  Surgeon: Irving Copas., MD;  Location: [facility];  Service: Gastroenterology;;   BILIARY DILATION  09/12/2018   Procedure: BILIARY DILATION;  Surgeon: Irving Copas., MD;  Location: [place];  Service: Gastroenterology;;   BILIARY DILATION  11/28/2018   Procedure: BILIARY DILATION;  Surgeon: Irving Copas., MD;  Location: [place];  Service: Gastroenterology;;   BILIARY DILATION  03/11/2020   Procedure: BILIARY DILATION;  Surgeon: Irving Copas., MD;  Location: [facility] ENDOSCOPY;  Service: Gastroenterology;;   BILIARY  STENT PLACEMENT  04/01/2018   Procedure: BILIARY STENT PLACEMENT;  Surgeon: Rush Landmark Telford Nab., MD;  Location: [place];  Service: Gastroenterology;;   BILIARY STENT PLACEMENT  09/12/2018   Procedure: BILIARY STENT PLACEMENT;  Surgeon: Irving Copas., MD;  Location: [place];  Service: Gastroenterology;;   BILIARY STENT PLACEMENT  11/28/2018   Procedure: BILIARY STENT PLACEMENT;  Surgeon: Irving Copas., MD;  Location: [place];  Service: Gastroenterology;;   BIOPSY  04/01/2018   Procedure: BIOPSY;  Surgeon: Irving Copas., MD;  Location: [place];  Service:  Gastroenterology;;   BIOPSY  09/12/2018   Procedure: BIOPSY;  Surgeon: Irving Copas., MD;  Location: [place];  Service: Gastroenterology;;   BIOPSY  11/28/2018   Procedure: BIOPSY;  Surgeon: Irving Copas., MD;  Location: [place];  Service: Gastroenterology;;   BIOPSY  03/11/2020   Procedure: BIOPSY;  Surgeon: Irving Copas., MD;  Location: [facility] ENDOSCOPY;  Service: Gastroenterology;;   bladder cancer      x 8 cystoscopy   CERVICAL DISCECTOMY     ACDF   COLONOSCOPY  11/17/2005   normal    CORONARY ARTERY BYPASS GRAFT     x4   CORONARY STENT INTERVENTION N/A 01/05/2019   Procedure: CORONARY STENT INTERVENTION;  Surgeon: Nelva Bush, MD;  Location: [place] CV LAB;  Service: Cardiovascular;  Laterality: N/A;   ENDOSCOPIC MUCOSAL RESECTION  09/12/2018   Procedure: ENDOSCOPIC MUCOSAL RESECTION;  Surgeon: Rush Landmark, Telford Nab., MD;  Location: [facility] ENDOSCOPY;  Service: Gastroenterology;;   ENDOSCOPIC RETROGRADE CHOLANGIOPANCREATOGRAPHY (ERCP) WITH PROPOFOL N/A 04/01/2018   Procedure: ENDOSCOPIC RETROGRADE CHOLANGIOPANCREATOGRAPHY (ERCP) WITH PROPOFOL;  Surgeon: Irving Copas., MD;  Location: [place];  Service: Gastroenterology;  Laterality: N/A;   ENDOSCOPIC RETROGRADE CHOLANGIOPANCREATOGRAPHY (ERCP) WITH PROPOFOL N/A 09/12/2018   Procedure: ENDOSCOPIC RETROGRADE CHOLANGIOPANCREATOGRAPHY (ERCP) WITH PROPOFOL;  Surgeon: Rush Landmark Telford Nab., MD;  Location: [place];  Service: Gastroenterology;  Laterality: N/A;   ENDOSCOPIC RETROGRADE CHOLANGIOPANCREATOGRAPHY (ERCP) WITH PROPOFOL N/A 03/11/2020   Procedure: ENDOSCOPIC RETROGRADE CHOLANGIOPANCREATOGRAPHY (ERCP) WITH PROPOFOL;  Surgeon: Rush Landmark Telford Nab., MD;  Location: [facility] ENDOSCOPY;  Service: Gastroenterology;  Laterality: N/A;   ENDOSCOPIC RETROGRADE CHOLANGIOPANCREATOGRAPHY (ERCP) WITH PROPOFOL N/A 06/03/2020   Procedure: ENDOSCOPIC RETROGRADE CHOLANGIOPANCREATOGRAPHY (ERCP) WITH  PROPOFOL;  Surgeon: Rush Landmark Telford Nab., MD;  Location: [facility]OS[facility];  Service: Gastroenterology;  Laterality: N/A;   ERCP N/A 11/28/2018   Procedure: ENDOSCOPIC RETROGRADE CHOLANGIOPANCREATOGRAPHY (ERCP) +EGD with spyglass;  Surgeon: Rush Landmark Telford Nab., MD;  Location: [place];  Service: Gastroenterology;  Laterality: N/A;   ESOPHAGOGASTRODUODENOSCOPY  04/29/2010   ESOPHAGOGASTRODUODENOSCOPY (EGD) WITH PROPOFOL N/A 04/01/2018   Procedure: ESOPHAGOGASTRODUODENOSCOPY (EGD) WITH PROPOFOL;  Surgeon: Rush Landmark Telford Nab., MD;  Location: [place];  Service: Gastroenterology;  Laterality: N/A;   ESOPHAGOGASTRODUODENOSCOPY (EGD) WITH PROPOFOL N/A 09/12/2018   Procedure: ESOPHAGOGASTRODUODENOSCOPY (EGD) WITH PROPOFOL;  Surgeon: Rush Landmark Telford Nab., MD;  Location: [place];  Service: Gastroenterology;  Laterality: N/A;   ESOPHAGOGASTRODUODENOSCOPY (EGD) WITH PROPOFOL N/A 11/28/2018   Procedure: ESOPHAGOGASTRODUODENOSCOPY (EGD) WITH PROPOFOL;  Surgeon: Rush Landmark Telford Nab., MD;  Location: [place];  Service: Gastroenterology;  Laterality: N/A;   ESOPHAGOGASTRODUODENOSCOPY (EGD) WITH PROPOFOL N/A 03/11/2020   Procedure: ESOPHAGOGASTRODUODENOSCOPY (EGD) WITH PROPOFOL;  Surgeon: Rush Landmark Telford Nab., MD;  Location: [facility] ENDOSCOPY;  Service: Gastroenterology;  Laterality: N/A;   ESOPHAGOGASTRODUODENOSCOPY (EGD) WITH PROPOFOL N/A 06/03/2020   Procedure: ESOPHAGOGASTRODUODENOSCOPY (EGD) WITH PROPOFOL;  Surgeon: Rush Landmark Telford Nab., MD;  Location: [facility] ENDOSCOPY;  Service: Gastroenterology;  Laterality: N/A;   EUS  04/01/2018  Procedure: FULL UPPER ENDOSCOPIC ULTRASOUND (EUS) RADIAL;  Surgeon: Irving Copas., MD;  Location: [place];  Service: Gastroenterology;;   EUS N/A 09/12/2018   Procedure: UPPER ENDOSCOPIC ULTRASOUND (EUS) RADIAL;  Surgeon: Irving Copas., MD;  Location: [place];  Service: Gastroenterology;  Laterality: N/A;   FINE NEEDLE ASPIRATION   09/12/2018   Procedure: FINE NEEDLE ASPIRATION (FNA) LINEAR;  Surgeon: Irving Copas., MD;  Location: [place];  Service: Gastroenterology;;   HAND SURGERY Left 2018   saw accident   HEMOSTASIS CLIP PLACEMENT  09/12/2018   Procedure: HEMOSTASIS CLIP PLACEMENT;  Surgeon: Irving Copas., MD;  Location: [place];  Service: Gastroenterology;;   HEMOSTASIS CLIP PLACEMENT  06/03/2020   Procedure: HEMOSTASIS CLIP PLACEMENT;  Surgeon: Irving Copas., MD;  Location: [facility];  Service: Gastroenterology;;   I & D EXTREMITY Left 11/24/2016   Procedure: IRRIGATION AND DEBRIDEMENT LEFT HAND, THUMB, INDEX, MIDDLE, RING, AND SMALL FINGERS WITH RECONSTRUCTION;  Surgeon: Roseanne Kaufman, MD;  Location: [place];  Service: Orthopedics;  Laterality: Left;   KNEE ARTHROSCOPY Left    LEFT HEART CATH AND CORS/GRAFTS ANGIOGRAPHY N/A 01/05/2019   Procedure: LEFT HEART CATH AND CORS/GRAFTS ANGIOGRAPHY;  Surgeon: Nelva Bush, MD;  Location: [place] [facility];  Service: Cardiovascular;  Laterality: N/A;   LUMBAR LAMINECTOMY     and fusion x 2   NASAL SINUS SURGERY     POPLITEAL SYNOVIAL CYST EXCISION     REMOVAL OF STONES  04/01/2018   Procedure: REMOVAL OF STONES;  Surgeon: Rush Landmark Telford Nab., MD;  Location: [place];  Service: Gastroenterology;;   REMOVAL OF STONES  09/12/2018   Procedure: REMOVAL OF STONES;  Surgeon: Irving Copas., MD;  Location: [place];  Service: Gastroenterology;;   REMOVAL OF STONES  11/28/2018   Procedure: REMOVAL OF STONES;  Surgeon: Irving Copas., MD;  Location: [place];  Service: Gastroenterology;;   REMOVAL OF STONES  03/11/2020   Procedure: REMOVAL OF STONES;  Surgeon: Irving Copas., MD;  Location: [facility];  Service: Gastroenterology;;   REMOVAL OF STONES  06/03/2020   Procedure: REMOVAL OF STONES;  Surgeon: Irving Copas., MD;  Location: [facility];  Service: Gastroenterology;;   Azzie Almas  DILATION N/A 09/12/2018   Procedure: Renato Battles;  Surgeon: Irving Copas., MD;  Location: [place];  Service: Gastroenterology;  Laterality: N/A;   SAVORY DILATION N/A 11/28/2018   Procedure: SAVORY DILATION;  Surgeon: Rush Landmark Telford Nab., MD;  Location: [place];  Service: Gastroenterology;  Laterality: N/A;   SEPTOPLASTY Bilateral 05/26/2021   Procedure: SEPTOPLASTY;  Surgeon: Leta Baptist, MD;  Location: [place];  Service: ENT;  Laterality: Bilateral;   SPHINCTEROTOMY  04/01/2018   Procedure: Joan Mayans;  Surgeon: Mansouraty, Telford Nab., MD;  Location: [place];  Service: Gastroenterology;;   Bess Kinds CHOLANGIOSCOPY N/A 11/28/2018   Procedure: NWGNFAOZ CHOLANGIOSCOPY;  Surgeon: Irving Copas., MD;  Location: [place];  Service: Gastroenterology;  Laterality: N/A;   SPYGLASS CHOLANGIOSCOPY N/A 06/03/2020   Procedure: SPYGLASS CHOLANGIOSCOPY;  Surgeon: Irving Copas., MD;  Location: [facility];  Service: Gastroenterology;  Laterality: N/A;   STENT REMOVAL  09/12/2018   Procedure: STENT REMOVAL;  Surgeon: Irving Copas., MD;  Location: [facility];  Service: Gastroenterology;;   Lavell Islam REMOVAL  11/28/2018   Procedure: STENT REMOVAL;  Surgeon: Irving Copas., MD;  Location: [place];  Service: Gastroenterology;;   Lavell Islam REMOVAL  03/11/2020   Procedure: STENT REMOVAL;  Surgeon: Irving Copas.,  MD;  Location: [facility];  Service: Gastroenterology;;   SUBMUCOSAL LIFTING INJECTION  09/12/2018   Procedure: SUBMUCOSAL LIFTING INJECTION;  Surgeon: Irving Copas., MD;  Location: [place];  Service: Gastroenterology;;    Social History:  reports that he quit smoking about 45 years ago. His smoking use included cigarettes. He has a 2.50 pack-year smoking history. He has never used smokeless tobacco. He reports that he does not drink alcohol and does not use drugs.  Allergies: No Active  Allergies  Family History:  Family History  Problem Relation Age of Onset   Melanoma Mother    Stroke Father    Hypertension Father    Coronary artery disease Other    Colon cancer Neg Hx    Esophageal cancer Neg Hx    Stomach cancer Neg Hx    Rectal cancer Neg Hx    Pancreatic cancer Neg Hx    Liver disease Neg Hx    Inflammatory bowel disease Neg Hx      Current Outpatient Medications:    acetaminophen (TYLENOL) 500 MG tablet, Take 1,000 mg by mouth every 6 (six) hours as needed for moderate pain., Disp: , Rfl:    amLODipine (NORVASC) 2.5 MG tablet, Take 2 tablets (5 mg total) by mouth daily. KEEP UPCOMING APPOINTMENT FOR FUTURE REFILLS., Disp: 60 tablet, Rfl: 0   cetirizine (ZYRTEC) 10 MG tablet, Take 10 mg by mouth daily as needed for allergies., Disp: , Rfl:    Coenzyme Q10 300 MG CAPS, Take 300 mg by mouth every evening., Disp: , Rfl:    cyanocobalamin (,VITAMIN B-12,) 1000 MCG/ML injection, INJECT 1 ML  SUBCUTANEOUSLY ONCE EVERY MONTH, Disp: 3 mL, Rfl: 0   dextromethorphan-guaiFENesin (MUCINEX DM) 30-600 MG 12hr tablet, Take 1 tablet by mouth 2 (two) times daily., Disp: , Rfl:    ezetimibe (ZETIA) 10 MG tablet, Take 1 tablet (10 mg total) by mouth daily. KEEP UPCOMING APPOINTMENT FOR FUTURE REFILLS., Disp: 30 tablet, Rfl: 0   Ferrous Sulfate (IRON) 28 MG TABS, Take 28 mg by mouth daily., Disp: , Rfl:    furosemide (LASIX) 40 MG tablet, Take 1 tablet (40 mg total) by mouth every other day. KEEP UPCOMING APPOINTMENT FOR FUTURE REFILLS., Disp: 15 tablet, Rfl: 0   guaifenesin (HUMIBID E) 400 MG TABS tablet, Take 400 mg by mouth daily., Disp: , Rfl:    hyoscyamine (LEVSIN SL) 0.125 MG SL tablet, Place 1 tablet (0.125 mg total) under the tongue every 4 (four) hours as needed. (Patient taking differently: Place 0.125 mg under the tongue every 4 (four) hours as needed (spasms).), Disp: 30 tablet, Rfl: 2   isosorbide mononitrate (IMDUR) 60 MG 24 hr tablet, Take 1 tablet (60 mg total) by  mouth daily. KEEP UPCOMING APPOINTMENT FOR FUTURE REFILLS., Disp: 30 tablet, Rfl: 0   Menthol, Topical Analgesic, (BENGAY EX), Apply 1 application topically daily as needed (pain)., Disp: , Rfl:    METAMUCIL FIBER PO, Take 3 capsules by mouth daily., Disp: , Rfl:    Multiple Vitamins-Minerals (MULTIVITAMIN WITH MINERALS) tablet, Take 1 tablet by mouth daily., Disp: , Rfl:    Naphazoline-Pheniramine (OPCON-A) 0.027-0.315 % SOLN, Place 1 drop into both eyes daily as needed (itching eyes)., Disp: , Rfl:    nebivolol (BYSTOLIC) 10 MG tablet, Take 1 tablet by mouth once daily, Disp: 90 tablet, Rfl: 2   NEEDLE, DISP, 25 G (B-D DISP NEEDLE 25GX1") 25G X 1" MISC, Inject 1000 mcg into muscle once a month., Disp: 50 each, Rfl:  0   nitroGLYCERIN (NITROSTAT) 0.4 MG SL tablet, PLACE 1 TABLET UNDER THE TONGUE EVERY 5 MINUTES AS NEEDED FOR CHEST PAIN, Disp: 25 tablet, Rfl: 2   oxyCODONE-acetaminophen (PERCOCET) 10-325 MG tablet, Take 1 tablet by mouth 2 (two) times daily., Disp: 60 tablet, Rfl: 0   oxyCODONE-acetaminophen (PERCOCET) 10-325 MG tablet, Take 1 tablet by mouth 2 (two) times daily as needed for pain., Disp: 60 tablet, Rfl: 0   oxyCODONE-acetaminophen (PERCOCET) 10-325 MG tablet, Take 1 tablet by mouth 2 (two) times daily as needed for pain., Disp: 60 tablet, Rfl: 0   pantoprazole (PROTONIX) 40 MG tablet, Take 1 tablet (40 mg total) by mouth daily., Disp: 90 tablet, Rfl: 3   Probiotic Product (PROBIOTIC ADVANCED PO), Take 1 tablet by mouth every evening., Disp: , Rfl:    rOPINIRole (REQUIP) 2 MG tablet, TAKE 1 TABLET BY MOUTH AT BEDTIME, Disp: 90 tablet, Rfl: 0   rosuvastatin (CRESTOR) 10 MG tablet, Take 1 tablet (10 mg total) by mouth daily. KEEP UPCOMING APPOINTMENT FOR FUTURE REFILLS., Disp: 30 tablet, Rfl: 0   venlafaxine (EFFEXOR) 75 MG tablet, Take 1 tablet (75 mg total) by mouth daily., Disp: 90 tablet, Rfl: 1  Review of Systems:  Constitutional: Denies fever, chills, diaphoresis. HEENT: Denies  photophobia, eye pain, redness, hearing loss, ear pain, congestion, sore throat, rhinorrhea, sneezing, mouth sores, trouble swallowing, neck pain, neck stiffness and tinnitus.   Respiratory: Denies SOB, DOE, cough, chest tightness,  and wheezing.   Cardiovascular: Denies chest pain, palpitations and leg swelling.  Gastrointestinal: Denies nausea, vomiting and abdominal distention.  Genitourinary: Denies dysuria, urgency, frequency, hematuria, flank pain and difficulty urinating.  Endocrine: Denies: hot or cold intolerance, sweats, changes in hair or nails, polyuria, polydipsia. Musculoskeletal: Denies myalgias, back pain, joint swelling, arthralgias and gait problem.  Skin: Denies pallor, rash and wound.  Neurological: Denies dizziness, seizures, syncope, weakness, light-headedness, numbness and headaches.  Hematological: Denies adenopathy. Easy bruising, personal or family bleeding history  Psychiatric/Behavioral: Denies suicidal ideation, mood changes, confusion, nervousness, sleep disturbance and agitation    Physical Exam: Vitals:   10/16/21 1116  BP: 120/88  Pulse: 61  Resp: 18  Temp: 98.5 F (36.9 C)  TempSrc: Oral  SpO2: 90%  Weight: 162 lb 4 oz (73.6 kg)  Height: '5\' 9"'$  (1.753 m)    Body mass index is 23.96 kg/m.   Constitutional: NAD, calm, comfortable Eyes: PERRL, lids and conjunctivae normal ENMT: Mucous membranes are moist.  Abdomen: no tenderness, no masses palpated. No hepatosplenomegaly. Bowel sounds positive.  Psychiatric: Normal judgment and insight. Alert and oriented x 3. Normal mood.    Impression and Plan:  Needs flu shot - Plan: Flu Vaccine QUAD High Dose(Fluad)  Rectal pain - Plan: Ambulatory referral to Gastroenterology  Esophageal dysphagia  -Flu vaccine administered in office today. -Colonoscopy from 2019 was reviewed.  He had an adenomatous polyp removed and was advised 5-year follow-up.  He is close enough that with his symptoms I believe  early colonoscopy is warranted.  I will send back to GI. -He may also benefit from EGD given dysphagia for dilatation purposes but will defer to GI.  Time spent:31 minutes reviewing chart, interviewing and examining patient and formulating plan of care.     [name], MD [facility]

## 2021-10-18 ENCOUNTER — Other Ambulatory Visit: Payer: Self-pay | Admitting: Cardiovascular Disease

## 2021-10-20 ENCOUNTER — Other Ambulatory Visit: Payer: Self-pay | Admitting: *Deleted

## 2021-10-20 MED ORDER — ROPINIROLE HCL 2 MG PO TABS: 2.0000 mg | ORAL_TABLET | Freq: Every day | ORAL | 1 refills | Status: DC

## 2021-10-27 ENCOUNTER — Other Ambulatory Visit: Payer: Self-pay | Admitting: Internal Medicine

## 2021-10-27 DIAGNOSIS — E538 Deficiency of other specified B group vitamins: Secondary | ICD-10-CM

## 2021-11-07 ENCOUNTER — Other Ambulatory Visit: Payer: Self-pay | Admitting: Gastroenterology

## 2021-11-08 ENCOUNTER — Other Ambulatory Visit: Payer: Self-pay | Admitting: Cardiovascular Disease

## 2021-11-10 ENCOUNTER — Other Ambulatory Visit: Payer: Self-pay | Admitting: Internal Medicine

## 2021-11-11 ENCOUNTER — Telehealth: Payer: Self-pay | Admitting: Gastroenterology

## 2021-11-11 NOTE — Telephone Encounter (Signed)
Inbound call from patient stating that he had a referral from his PCP for rectal pain and Esophageal dysphagia. Patient was scheduled for 11/29 at 9:00 with [facility] but is wanting to know if he can be seen sooner due to being in a lot of pain. Please advise.

## 2021-11-11 NOTE — Telephone Encounter (Signed)
No sooner appts available    Left message on machine to call back

## 2021-11-12 NOTE — Telephone Encounter (Signed)
Left message on machine to call back  

## 2021-11-13 NOTE — Telephone Encounter (Signed)
I have been unable to reach the pt by phone will await further communication from the pt

## 2021-11-17 ENCOUNTER — Telehealth: Payer: Self-pay | Admitting: Cardiovascular Disease

## 2021-11-17 ENCOUNTER — Ambulatory Visit: Payer: Medicare Other | Admitting: Cardiovascular Disease

## 2021-11-17 MED ORDER — ISOSORBIDE MONONITRATE ER 60 MG PO TB24: 60.0000 mg | ORAL_TABLET | Freq: Every day | ORAL | 0 refills | Status: DC

## 2021-11-17 MED ORDER — AMLODIPINE BESYLATE 2.5 MG PO TABS: 5.0000 mg | ORAL_TABLET | Freq: Every day | ORAL | 0 refills | Status: DC

## 2021-11-17 NOTE — Telephone Encounter (Signed)
*  STAT* If patient is at the pharmacy, call can be transferred to refill team.   1. Which medications need to be refilled? (please list name of each medication and dose if known) amLODipine (NORVASC) 2.5 MG tablet isosorbide mononitrate (IMDUR) 60 MG 24 hr tablet   2. Which pharmacy/location (including street and city if local pharmacy) is medication to be sent to? [place], [place]  3. Do they need a 30 day or 90 day supply? [place]

## 2021-11-17 NOTE — Telephone Encounter (Signed)
Refills sent. Follow up with Dr. Claiborne Billings in March

## 2021-11-19 ENCOUNTER — Ambulatory Visit: Payer: Medicare Other | Attending: Cardiovascular Disease | Admitting: Cardiovascular Disease

## 2021-11-19 ENCOUNTER — Encounter: Payer: Self-pay | Admitting: Cardiovascular Disease

## 2021-11-19 VITALS — BP 140/62 | HR 59 | Ht 69.0 in | Wt 177.6 lb

## 2021-11-19 DIAGNOSIS — I25709 Atherosclerosis of coronary artery bypass graft(s), unspecified, with unspecified angina pectoris: Secondary | ICD-10-CM

## 2021-11-19 DIAGNOSIS — Z951 Presence of aortocoronary bypass graft: Secondary | ICD-10-CM | POA: Diagnosis not present

## 2021-11-19 DIAGNOSIS — E785 Hyperlipidemia, unspecified: Secondary | ICD-10-CM

## 2021-11-19 DIAGNOSIS — M1990 Unspecified osteoarthritis, unspecified site: Secondary | ICD-10-CM | POA: Diagnosis not present

## 2021-11-19 DIAGNOSIS — R0602 Shortness of breath: Secondary | ICD-10-CM

## 2021-11-19 DIAGNOSIS — R1310 Dysphagia, unspecified: Secondary | ICD-10-CM | POA: Diagnosis not present

## 2021-11-19 DIAGNOSIS — I10 Essential (primary) hypertension: Secondary | ICD-10-CM

## 2021-11-19 NOTE — Progress Notes (Signed)
Patient ID: [name], male   DOB: 1940/09/30, 81 y.o.   MRN: [id number]        HPI: [name] is a 81 y.o. male who presents to the office today for a 12 month cardiology followup evaluation.  Mr. Imran has known CAD and underwent CABG revascularization surgery in 2001 with a LIMA to his LAD, vein to the marginal, vein to the RCA. Catheterization in 2004 showed patent grafts. An echo Doppler study in August 2012 revealed an ejection fraction of 40-45% with moderate septal hypokinesis with grade 1 diastolic dysfunction as well as aortic valve sclerosis. Additional problems include GERD, mild emphysema, as well as obstructive sleep apnea. He no longer utilizes CPAP. He has lost approximately 25 pounds since his wife's death from a motor vehicle accident in January 2013.  He remains active and notes mild shortness of breath with activity particularly when walking up hills but this has not changed.  He also is a history of restless leg syndrome and has benefited with requip. Laboratory in 2013 demonstrated LDL 57 with LDL particle #1035, small LDL particle number was 611, slightly increased, HDLC was 35 triglycerides 93 total cholesterol 111 initial resistance course slightly elevated at 53.  BP blood work in March 2015 revealed normal renal function.  Fasting glucose was minimally increased at 106.  Total cholesterol was 116 triglycerides 80, and LDL 66, with an HDL of 34.  LDL particle number was slightly increased at 1190.  He undewent a five-year follow-up nuclear perfusion study in November 2016.  Ejection fraction was 54%.  He had normal perfusion.  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  He admits to arthralgias without myalgias.  He has issues with his in his legs and his peripheral neuropathy.  I further titrated his losartan to 75 mg and Bystolic to 10 mg.  He believes his blood pressure and pulse have been  better.  He admits to leg discomfort which seems worse with atorvastatin.  When I last saw him, he had noticed some shortness of breath with uphill walking and with exertion but denied recurrent episodes of chest tightness.   He denied PND, orthopnea.  He underwent a follow-up echo Doppler study on 10/28/2016.  Ejection fraction was excellent at 60-65%.  There was grade 2 diastolic dysfunction.  There was evidence for aortic sclerosis without stenosis.  There was mild LA dilation.  He had trivial PR.    I saw him in October 2018. He subsequently  underwent left hand surgery.  His primary care with [name] is retired.  When I last saw him in November 2019 recently, he  noticed progressive increase in shortness of breath and this seems to develop even trying to walk up very small hill.  He denied any definitive chest tightness.  He has started to run out of some of his chronic pain meds which had been prescribed by [name].  He has not yet reestablished with another primary physician.  He was on Bystolic 10 mg and isosorbide 60 mg for his CAD and hypertension.  He was on Zetia 10 mg and rosuvastatin for hyperlipidemia.  He continues to be on chronic aspirin therapy.  He has restless legs and is on Requip.  His previous primary physician had prescribed Effexor in addition to Percocet.  In November 2019, with his change in symptomatology I recommended a follow-up echo Doppler study and a lexiscan  Myoview.  As he had had some issues with elevated potassium on ARB therapy and I started him on HCTZ other than spironolactone.  December 23, 2017 an echo Doppler study showed an EF of 60 to 65% with grade 1 diastolic dysfunction and mild aortic valve stenosis.  A Lexiscan Myoview study on January 04, 2018 was low risk and demonstrated normal perfusion without scar or ischemia.  EF was 51%.  He was hospitalized from March 11 through April 06, 2019 presenting with abdominal discomfort,.  Laboratory  revealed leukocytosis to 21,400, elevated lactic acid of 2.4, elevated lipase at 390 urinalysis notable for proteinuria and a creatinine had risen to 1.59 and there was mild elevation of AST and total bilirubin.  He was ultimately had positive blood cultures for coli to have sepsis and colitis.  An MRCP showed acute pancreatitis, mild gallbladder distention with peri-cholecystic fluid and gallbladder wall thickening.  School he underwent ERCP on April 01, 2018 which showed stricture and sludge without stone he underwent sphincterotomy and stent placement.  He is planned to be on antibiotics for several weeks.  During his hospitalization he developed acute kidney injury with peak creatinine at 2.27.  His medications were adjusted and on discharge creatinine was 1.75.    He was  evaluated by me in a telemedicine evaluation on May 19, 2018.  From a cardiac perspective he felt improved.  He was not having any leg swelling.  He denied any chest pain but continued to experience shortness of breath symptoms.  Creatinine had improved to 1.46.  He continues to have issues with back and leg discomfort and remotely had undergone steroid injections with [name].  I saw him in evaluation September 2020 at which time he continued  to experience his chronic shortness of breath but that has not increased in severity.  He denies any chest pain.  He has had some issues with swallowing and continues to see GI physician.  He has undergone esophageal dilatation.  He is unaware of any rhythm disturbance.  He has been evaluated by [name] and he was referred for a lower extremity Doppler study which was negative for DVT.  He had been noted to have a left pleural effusion which is followed by 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   He eventually underwent robotic cholecystectomy by Dr. Windle Guard on 01/02/2019.   He has biliary stents in place for distal biliary stricture.  After his procedure, he developed a  bilateral pneumonia and shortness of breath with increasing troponin.  CT of the chest was negative for PE.  Patient was treated with IV Lasix and antibiotic. Troponin was elevated and he subsequently underwent cardiac catheterization on 01/05/2019 by Dr. Saunders Revel which showed 99% subtotally occluded SVG-RPDA/RPL.  This was treated with 3 overlapping resolute Onyx drug-eluting stents.  Postprocedure, it was recommended to continue aspirin and Plavix indefinitely and if the patient subsequently developed  refractory symptoms there was consideration for PCI of ostial left main, left circumflex and OM1.  Echocardiogram obtained on the same day showed EF 40 to 45%, severe hypokinesis of the entire inferior and inferoseptal wall, grade 1 DD, mild MR, cannot exclude ASD/PFO.   He was seen in follow-up by Almyra Deforest on January 19, 2019.  He continued to have fatigue however gradually improving.  He previously had a epigastric pain which he attributed to acid reflux which retrospectively may have been his angina since there was complete resolution of this discomfort following  his hospitalization.  He was walking at home multiple times throughout the day without any issue.     He was seen in a telemedicine evaluation in March 2021 and over the several months prior to that evaluation he denied any recurrent chest pain episodes.  However he admitted to feeling "rough."  He was getting physical therapy times per week.  He has been staying at home all the time.  His heart rate has been regular.  He is not aware of any palpitations.  He had not been using CPAP.   When I saw him in June 2021 he complained that he "hurts all over."  He states he has aches in his legs, back and shoulders.  Oftentimes he notices his legs hurt worse before he has to go to the bathroom.  He denied any anginal symptoms and was unaware of any cardiac arrhythmia.  He denied presyncope or syncope.    He was evaluated by Coletta Memos in October 2021 and  most recently January 2022.  He had been started on Lasix for leg swelling in October 2021 and continued to be on Bystolic, Imdur, aspirin/Plavix and statin.    I saw him on March 22, 2020 at which time he felt well.  He had had purposeful weight loss.  He has a stent in the liver a due to his prior a sending cholangitis and pancreatitis due to biliary strictures.  He is scheduled to undergo a CT of his abdomen at the end of March for further evaluation.  He denies any chest pain or anginal symptomatology.  Breathing is better.  He admits to right shoulder discomfort.  During that evaluation, his blood pressure was stable amlodipine 5 mg, isosorbide 60 mg, and Bystolic 10 mg.  I last saw him on December 05, 2020.  At that time he felt well from a cardiac standpoint.  He was continuing to have some pain in his shoulder particularly when raising his arms.   At times he developed some nausea following eating.  He will need dental extraction of approximately 10 teeth.  His blood pressure has been stable at home.    Since I last saw him, he has felt well from a cardiac standpoint.  He has been having continued difficulty with arthritis involving his neck, back, and shoulders.  He also has had difficulty swallowing solids and is scheduled to see follow-up GI.  In the past he has required several esophageal dilatations.  He continues to be on amlodipine at low-dose 2.5 mg, furosemide which he now takes as needed for swelling, isosorbide 60 mg daily, and nebivolol 10 mg daily for blood pressure and CAD.  He is on rosuvastatin 10 mg for hyperlipidemia.  He is on pantoprazole for GERD.  He continues to be on Effexor to help with some depression.  He denies presyncope or syncope or awareness of palpitations.  He presents for follow-up evaluation.  Past Medical History:  Diagnosis Date   Allergy    B12 deficiency anemia    Blood transfusion without reported diagnosis    CAD (coronary artery disease)    Cancer (HCC)     bladder-    Colon polyps    COPD (chronic obstructive pulmonary disease) (HCC)    Depression    Diabetes mellitus (HCC)    Esophagus, Barrett's    GERD (gastroesophageal reflux disease)    History of bladder cancer    Bladder cancer "8 times"   History of hiatal hernia  Hyperlipidemia    Hypertension    Localized osteoarthrosis, lower leg    Myocardial infarction ([place]) 2021   Restless leg syndrome    Sleep apnea    does not wear cpap   Stenosis of esophagus     Past Surgical History:  Procedure Laterality Date   BILIARY BRUSHING  04/01/2018   Procedure: BILIARY BRUSHING;  Surgeon: Irving Copas., MD;  Location: [place];  Service: Gastroenterology;;   BILIARY BRUSHING  09/12/2018   Procedure: BILIARY BRUSHING;  Surgeon: Irving Copas., MD;  Location: [facility] ENDOSCOPY;  Service: Gastroenterology;;   BILIARY BRUSHING  11/28/2018   Procedure: BILIARY BRUSHING;  Surgeon: Irving Copas., MD;  Location: [facility] ENDOSCOPY;  Service: Gastroenterology;;   BILIARY BRUSHING  03/11/2020   Procedure: BILIARY BRUSHING;  Surgeon: Irving Copas., MD;  Location: [facility];  Service: Gastroenterology;;   BILIARY DILATION  09/12/2018   Procedure: BILIARY DILATION;  Surgeon: Irving Copas., MD;  Location: [place];  Service: Gastroenterology;;   BILIARY DILATION  11/28/2018   Procedure: BILIARY DILATION;  Surgeon: Irving Copas., MD;  Location: [place];  Service: Gastroenterology;;   BILIARY DILATION  03/11/2020   Procedure: BILIARY DILATION;  Surgeon: Irving Copas., MD;  Location: [facility]OSCOPY;  Service: Gastroenterology;;   BILIARY STENT PLACEMENT  04/01/2018   Procedure: BILIARY STENT PLACEMENT;  Surgeon: Irving Copas., MD;  Location: [place];  Service: Gastroenterology;;   BILIARY STENT PLACEMENT  09/12/2018   Procedure: BILIARY STENT PLACEMENT;  Surgeon: Irving Copas., MD;  Location: [place];   Service: Gastroenterology;;   BILIARY STENT PLACEMENT  11/28/2018   Procedure: BILIARY STENT PLACEMENT;  Surgeon: Irving Copas., MD;  Location: [place];  Service: Gastroenterology;;   BIOPSY  04/01/2018   Procedure: BIOPSY;  Surgeon: Irving Copas., MD;  Location: [place];  Service: Gastroenterology;;   BIOPSY  09/12/2018   Procedure: BIOPSY;  Surgeon: Irving Copas., MD;  Location: [place];  Service: Gastroenterology;;   BIOPSY  11/28/2018   Procedure: BIOPSY;  Surgeon: Irving Copas., MD;  Location: [place];  Service: Gastroenterology;;   BIOPSY  03/11/2020   Procedure: BIOPSY;  Surgeon: Irving Copas., MD;  Location: [facility] ENDOSCOPY;  Service: Gastroenterology;;   bladder cancer      x 8 cystoscopy   CERVICAL DISCECTOMY     ACDF   COLONOSCOPY  11/17/2005   normal    CORONARY ARTERY BYPASS GRAFT     x4   CORONARY STENT INTERVENTION N/A 01/05/2019   Procedure: CORONARY STENT INTERVENTION;  Surgeon: Nelva Bush, MD;  Location: [place] CV [facility];  Service: Cardiovascular;  Laterality: N/A;   ENDOSCOPIC MUCOSAL RESECTION  09/12/2018   Procedure: ENDOSCOPIC MUCOSAL RESECTION;  Surgeon: Rush Landmark, Telford Nab., MD;  Location: [facility] ENDOSCOPY;  Service: Gastroenterology;;   ENDOSCOPIC RETROGRADE CHOLANGIOPANCREATOGRAPHY (ERCP) WITH PROPOFOL N/A 04/01/2018   Procedure: ENDOSCOPIC RETROGRADE CHOLANGIOPANCREATOGRAPHY (ERCP) WITH PROPOFOL;  Surgeon: Irving Copas., MD;  Location: [place];  Service: Gastroenterology;  Laterality: N/A;   ENDOSCOPIC RETROGRADE CHOLANGIOPANCREATOGRAPHY (ERCP) WITH PROPOFOL N/A 09/12/2018   Procedure: ENDOSCOPIC RETROGRADE CHOLANGIOPANCREATOGRAPHY (ERCP) WITH PROPOFOL;  Surgeon: Rush Landmark Telford Nab., MD;  Location: [place];  Service: Gastroenterology;  Laterality: N/A;   ENDOSCOPIC RETROGRADE CHOLANGIOPANCREATOGRAPHY (ERCP) WITH PROPOFOL N/A 03/11/2020   Procedure: ENDOSCOPIC RETROGRADE  CHOLANGIOPANCREATOGRAPHY (ERCP) WITH PROPOFOL;  Surgeon: Rush Landmark Telford Nab., MD;  Location: [facility] ENDOSCOPY;  Service: Gastroenterology;  Laterality: N/A;   ENDOSCOPIC RETROGRADE CHOLANGIOPANCREATOGRAPHY (ERCP) WITH PROPOFOL N/A 06/03/2020  Procedure: ENDOSCOPIC RETROGRADE CHOLANGIOPANCREATOGRAPHY (ERCP) WITH PROPOFOL;  Surgeon: Rush Landmark Telford Nab., MD;  Location: [facility] ENDOSCOPY;  Service: Gastroenterology;  Laterality: N/A;   ERCP N/A 11/28/2018   Procedure: ENDOSCOPIC RETROGRADE CHOLANGIOPANCREATOGRAPHY (ERCP) +EGD with spyglass;  Surgeon: Rush Landmark Telford Nab., MD;  Location: [place];  Service: Gastroenterology;  Laterality: N/A;   ESOPHAGOGASTRODUODENOSCOPY  04/29/2010   ESOPHAGOGASTRODUODENOSCOPY (EGD) WITH PROPOFOL N/A 04/01/2018   Procedure: ESOPHAGOGASTRODUODENOSCOPY (EGD) WITH PROPOFOL;  Surgeon: Rush Landmark Telford Nab., MD;  Location: [place];  Service: Gastroenterology;  Laterality: N/A;   ESOPHAGOGASTRODUODENOSCOPY (EGD) WITH PROPOFOL N/A 09/12/2018   Procedure: ESOPHAGOGASTRODUODENOSCOPY (EGD) WITH PROPOFOL;  Surgeon: Rush Landmark Telford Nab., MD;  Location: [place];  Service: Gastroenterology;  Laterality: N/A;   ESOPHAGOGASTRODUODENOSCOPY (EGD) WITH PROPOFOL N/A 11/28/2018   Procedure: ESOPHAGOGASTRODUODENOSCOPY (EGD) WITH PROPOFOL;  Surgeon: Rush Landmark Telford Nab., MD;  Location: [place];  Service: Gastroenterology;  Laterality: N/A;   ESOPHAGOGASTRODUODENOSCOPY (EGD) WITH PROPOFOL N/A 03/11/2020   Procedure: ESOPHAGOGASTRODUODENOSCOPY (EGD) WITH PROPOFOL;  Surgeon: Rush Landmark Telford Nab., MD;  Location: [facility] ENDOSCOPY;  Service: Gastroenterology;  Laterality: N/A;   ESOPHAGOGASTRODUODENOSCOPY (EGD) WITH PROPOFOL N/A 06/03/2020   Procedure: ESOPHAGOGASTRODUODENOSCOPY (EGD) WITH PROPOFOL;  Surgeon: Rush Landmark Telford Nab., MD;  Location: [facility] ENDOSCOPY;  Service: Gastroenterology;  Laterality: N/A;   EUS  04/01/2018   Procedure: FULL UPPER ENDOSCOPIC ULTRASOUND (EUS)  RADIAL;  Surgeon: Irving Copas., MD;  Location: [place];  Service: Gastroenterology;;   EUS N/A 09/12/2018   Procedure: UPPER ENDOSCOPIC ULTRASOUND (EUS) RADIAL;  Surgeon: Irving Copas., MD;  Location: [place];  Service: Gastroenterology;  Laterality: N/A;   FINE NEEDLE ASPIRATION  09/12/2018   Procedure: FINE NEEDLE ASPIRATION (FNA) LINEAR;  Surgeon: Irving Copas., MD;  Location: [place];  Service: Gastroenterology;;   HAND SURGERY Left 2018   saw accident   HEMOSTASIS CLIP PLACEMENT  09/12/2018   Procedure: HEMOSTASIS CLIP PLACEMENT;  Surgeon: Irving Copas., MD;  Location: [place];  Service: Gastroenterology;;   HEMOSTASIS CLIP PLACEMENT  06/03/2020   Procedure: HEMOSTASIS CLIP PLACEMENT;  Surgeon: Irving Copas., MD;  Location: [facility];  Service: Gastroenterology;;   I & D EXTREMITY Left 11/24/2016   Procedure: IRRIGATION AND DEBRIDEMENT LEFT HAND, THUMB, INDEX, MIDDLE, RING, AND SMALL FINGERS WITH RECONSTRUCTION;  Surgeon: Roseanne Kaufman, MD;  Location: [place];  Service: Orthopedics;  Laterality: Left;   KNEE ARTHROSCOPY Left    LEFT HEART CATH AND CORS/GRAFTS ANGIOGRAPHY N/A 01/05/2019   Procedure: LEFT HEART CATH AND CORS/GRAFTS ANGIOGRAPHY;  Surgeon: Nelva Bush, MD;  Location: [place] [facility];  Service: Cardiovascular;  Laterality: N/A;   LUMBAR LAMINECTOMY     and fusion x 2   NASAL SINUS SURGERY     POPLITEAL SYNOVIAL CYST EXCISION     REMOVAL OF STONES  04/01/2018   Procedure: REMOVAL OF STONES;  Surgeon: Rush Landmark Telford Nab., MD;  Location: [place];  Service: Gastroenterology;;   REMOVAL OF STONES  09/12/2018   Procedure: REMOVAL OF STONES;  Surgeon: Irving Copas., MD;  Location: [place];  Service: Gastroenterology;;   REMOVAL OF STONES  11/28/2018   Procedure: REMOVAL OF STONES;  Surgeon: Irving Copas., MD;  Location: [place];  Service: Gastroenterology;;   REMOVAL OF  STONES  03/11/2020   Procedure: REMOVAL OF STONES;  Surgeon: Irving Copas., MD;  Location: [facility];  Service: Gastroenterology;;   REMOVAL OF STONES  06/03/2020   Procedure: REMOVAL OF STONES;  Surgeon: Irving Copas., MD;  Location: [facility];  Service:  Gastroenterology;;   Azzie Almas DILATION N/A 09/12/2018   Procedure: Azzie Almas DILATION;  Surgeon: Irving Copas., MD;  Location: [place];  Service: Gastroenterology;  Laterality: N/A;   SAVORY DILATION N/A 11/28/2018   Procedure: SAVORY DILATION;  Surgeon: Rush Landmark Telford Nab., MD;  Location: [place];  Service: Gastroenterology;  Laterality: N/A;   SEPTOPLASTY Bilateral 05/26/2021   Procedure: SEPTOPLASTY;  Surgeon: Leta Baptist, MD;  Location: [place];  Service: ENT;  Laterality: Bilateral;   SPHINCTEROTOMY  04/01/2018   Procedure: Joan Mayans;  Surgeon: Mansouraty, Telford Nab., MD;  Location: [place];  Service: Gastroenterology;;   Bess Kinds CHOLANGIOSCOPY N/A 11/28/2018   Procedure: HWTUUEKC CHOLANGIOSCOPY;  Surgeon: Irving Copas., MD;  Location: [place];  Service: Gastroenterology;  Laterality: N/A;   SPYGLASS CHOLANGIOSCOPY N/A 06/03/2020   Procedure: SPYGLASS CHOLANGIOSCOPY;  Surgeon: Irving Copas., MD;  Location: [facility];  Service: Gastroenterology;  Laterality: N/A;   STENT REMOVAL  09/12/2018   Procedure: STENT REMOVAL;  Surgeon: Irving Copas., MD;  Location: [place];  Service: Gastroenterology;;   Lavell Islam REMOVAL  11/28/2018   Procedure: STENT REMOVAL;  Surgeon: Irving Copas., MD;  Location: [place];  Service: Gastroenterology;;   Lavell Islam REMOVAL  03/11/2020   Procedure: STENT REMOVAL;  Surgeon: Irving Copas., MD;  Location: [facility];  Service: Gastroenterology;;   Lia Foyer LIFTING INJECTION  09/12/2018   Procedure: SUBMUCOSAL LIFTING INJECTION;  Surgeon: Irving Copas., MD;  Location: [place];   Service: Gastroenterology;;    No Active Allergies   Current Outpatient Medications  Medication Sig Dispense Refill   acetaminophen (TYLENOL) 500 MG tablet Take 1,000 mg by mouth every 6 (six) hours as needed for moderate pain.     amLODipine (NORVASC) 2.5 MG tablet Take 2 tablets (5 mg total) by mouth daily. KEEP UPCOMING APPOINTMENT FOR FUTURE REFILLS. 180 tablet 0   cetirizine (ZYRTEC) 10 MG tablet Take 10 mg by mouth daily as needed for allergies.     clopidogrel (PLAVIX) 75 MG tablet TAKE 1 TABLET BY MOUTH ONCE  DAILY 60 tablet 0   Coenzyme Q10 300 MG CAPS Take 300 mg by mouth every evening.     cyanocobalamin (VITAMIN B12) 1000 MCG/ML injection INJECT 1 ML  SUBCUTANEOUSLY ONCE EVERY MONTH 3 mL 0   dextromethorphan-guaiFENesin (MUCINEX DM) 30-600 MG 12hr tablet Take 1 tablet by mouth 2 (two) times daily.     ezetimibe (ZETIA) 10 MG tablet TAKE 1 TABLET BY MOUTH DAILY 100 tablet 1   Ferrous Sulfate (IRON) 28 MG TABS Take 28 mg by mouth daily.     furosemide (LASIX) 40 MG tablet Take 1 tablet (40 mg total) by mouth every other day. KEEP UPCOMING APPOINTMENT FOR FUTURE REFILLS. 15 tablet 0   guaifenesin (HUMIBID E) 400 MG TABS tablet Take 400 mg by mouth daily.     hyoscyamine (LEVSIN SL) 0.125 MG SL tablet Place 1 tablet (0.125 mg total) under the tongue every 4 (four) hours as needed. (Patient taking differently: Place 0.125 mg under the tongue every 4 (four) hours as needed (spasms).) 30 tablet 2   isosorbide mononitrate (IMDUR) 60 MG 24 hr tablet Take 1 tablet (60 mg total) by mouth daily. KEEP UPCOMING APPOINTMENT FOR FUTURE REFILLS. 90 tablet 0   Menthol, Topical Analgesic, (BENGAY EX) Apply 1 application topically daily as needed (pain).     Multiple Vitamins-Minerals (MULTIVITAMIN WITH MINERALS) tablet Take 1 tablet by mouth daily.     Naphazoline-Pheniramine (OPCON-A) 0.027-0.315 % SOLN Place  1 drop into both eyes daily as needed (itching eyes).     NEEDLE, DISP, 25 G (B-D DISP  NEEDLE 25GX1") 25G X 1" MISC Inject 1000 mcg into muscle once a month. 50 each 0   nitroGLYCERIN (NITROSTAT) 0.4 MG SL tablet PLACE 1 TABLET UNDER THE TONGUE EVERY 5 MINUTES AS NEEDED FOR CHEST PAIN 25 tablet 2   oxyCODONE-acetaminophen (PERCOCET) 10-325 MG tablet Take 1 tablet by mouth 2 (two) times daily. 60 tablet 0   oxyCODONE-acetaminophen (PERCOCET) 10-325 MG tablet Take 1 tablet by mouth 2 (two) times daily as needed for pain. 60 tablet 0   oxyCODONE-acetaminophen (PERCOCET) 10-325 MG tablet Take 1 tablet by mouth 2 (two) times daily as needed for pain. 60 tablet 0   pantoprazole (PROTONIX) 40 MG tablet Take 1 tablet by mouth once daily 90 tablet 0   Probiotic Product (PROBIOTIC ADVANCED PO) Take 1 tablet by mouth every evening.     rOPINIRole (REQUIP) 2 MG tablet Take 1 tablet (2 mg total) by mouth at bedtime. 90 tablet 1   rosuvastatin (CRESTOR) 10 MG tablet TAKE 1 TABLET BY MOUTH DAILY 60 tablet 5   venlafaxine (EFFEXOR) 75 MG tablet Take 1 tablet (75 mg total) by mouth daily. 90 tablet 1   METAMUCIL FIBER PO Take 3 capsules by mouth daily. (Patient not taking: Reported on 11/19/2021)     nebivolol (BYSTOLIC) 10 MG tablet Take 1 tablet (10 mg total) by mouth daily. Please schedule appointment for additional refills. 100 tablet 1   No current facility-administered medications for this visit.   Socially he is widowed in January 2013. He has 3 children 6 grandchildren. 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 There is no recent tobacco use.  no alcohol.  His daughter is the wife of my patient Mr. Theodosia Paling.  ROS General: Negative; No fevers, chills, or night sweats;  HEENT: Negative; No changes in vision or hearing, sinus congestion, difficulty swallowing Pulmonary: Negative; No cough, wheezing, shortness of breath, hemoptysis Cardiovascular: Negative; No chest pain, presyncope, syncope, palpatations.   Minimal shortness of breath with activity GI: GERD; recent pancreatitis and septic cholangitis GU: Recent acute kidney injury Musculoskeletal: Positive for multiple joint aches involving his legs, hips, back, neck and shoulder Hematologic/Oncology: Negative; no easy bruising, bleeding Endocrine: Negative; no heat/cold intolerance; no diabetes Neuro: Negative; no changes in balance, headaches Skin: Negative; No rashes or skin lesions Psychiatric: Negative; No behavioral problems, depression Sleep: He no longer uses CPAP with his 25 pound weight loss.  His restless legs improved with low-dose Requip; No snoring, daytime sleepiness, hypersomnolence, bruxism, hypnogognic hallucinations, no cataplexy Other comprehensive 14 point system review is negative.   PE BP (!) 140/62   Pulse (!) 59   Ht _0  (1.753 m)   Wt 177 lb 9.6 oz (80.6 kg)   SpO2 90%   BMI 26.23 kg/m    Repeat blood pressure by me was 140/64  Wt Readings from Last 3 Encounters:  11/19/21 177 lb 9.6 oz (80.6 kg)  10/16/21 162 lb 4 oz (73.6 kg)  09/04/21 173 lb 14.4 oz (78.9 kg)    General: Alert, oriented, no distress.  Skin: normal turgor, no rashes, warm and dry HEENT: Normocephalic, atraumatic. Pupils equal round and reactive to light; sclera anicteric; extraocular muscles intact;  Nose without nasal septal hypertrophy Mouth/Parynx benign; Mallinpatti scale 3 Neck: No JVD, no carotid bruits;  normal carotid upstroke Lungs: clear to ausculatation and percussion; no wheezing or rales Chest wall: without tenderness to palpitation Heart: PMI not displaced, RRR, s1 s2 normal, 1/6 systolic murmur, no diastolic murmur, no rubs, gallops, thrills, or heaves Abdomen: soft, nontender; no hepatosplenomehaly, BS+; abdominal aorta nontender and not dilated by palpation. Back: no CVA tenderness Pulses 2+ Musculoskeletal: full range of motion, normal strength, no joint deformities Extremities: no clubbing cyanosis or edema,  Homan's sign negative  Neurologic: grossly nonfocal; Cranial nerves grossly wnl Psychologic: Normal mood and affect     November 19, 2021 ECG (independently read by me):  Sinus bradycardia at 59    December 05, 2020 ECG (independently read by me): Sinus bradycardia at 59, no ectopy   March 22, 2020 ECG (independently read by me): Sinus bradycardia at 59; no ectopy, normal intervals  June 2021 ECG (independently read by me): Sinus bradycardia 58 bpm.  Q wave in lead III.  QTc interval 426 ms.  No ectopy.  October 10, 2018 ECG (independently read by me): Sinus rhythm with mild sinus arrhythmia at 71 bpm.  QTc interval 465 ms.  No significant ST changes.  No ectopy.  December 13, 2017 ECG (independently read by me): NSR at 64; IRBBB; normal intervals  July 2018 ECG (independently read by me): Sinus bradycardia 53 bpm.  No ectopy.  Normal intervals.  No ST segment changes.  December 2017 ECG (independently read by me): Normal sinus rhythm at 74 bpm.  No ectopy.  Normal intervals.  No ST segment changes.  October 2016 ECG (independently read by me): Normal sinus rhythm at 61 bpm.  No ectopy.  Normal intervals.  May 2016 ECG (independently read by me): Sinus bradycardia 56 bpm.  No ectopy.  Mild RV conduction delay.  QTc interval 395  ECG (independently read by me): sinus bradycardia 56 bpm.  Mild RV conduction delay.  Borderline first-degree AV block with a PR interval at 208 ms.  No ectopy.  Prior 06/05/2013 ECG (independently read by me): Normal sinus rhythm at 63 beats per minute.  No ectopy; normal intervals.  Prior November 2014 ECG: Sinus rhythm at 68beats per minute. There is mild RV conduction delay. Intervals were normal.  LABS:     Latest Ref Rng & Units 05/20/2021   12:33 PM 12/09/2020    8:53 AM 04/09/2020   11:01 AM  BMP  Glucose 70 - 99 mg/dL 151  113    BUN 8 - 23 mg/dL 20  21    Creatinine 0.61 - 1.24 mg/dL 1.47  1.42  1.40   BUN/Creat Ratio 10 - 24  15    Sodium  135 - 145 mmol/L 140  144    Potassium 3.5 - 5.1 mmol/L 4.7  4.8    Chloride 98 - 111 mmol/L 107  104    CO2 22 - 32 mmol/L 28  27    Calcium 8.9 - 10.3 mg/dL 8.9  9.2        Latest Ref Rng & Units 12/09/2020    8:53 AM 03/22/2020   12:47 PM 11/09/2019    2:08 PM  Hepatic Function  Total Protein 6.0 - 8.5 g/dL 7.2  7.1  6.9   Albumin 3.7 - 4.7 g/dL 4.2  3.7  3.7   AST 0 - 40 IU/L _0 ALT 0 - 44 IU/L _1 Alk Phosphatase 44 - 121 IU/L 83  67  70  Total Bilirubin 0.0 - 1.2 mg/dL 0.4  0.4  0.4   Bilirubin, Direct 0.0 - 0.3 mg/dL  0.1  0.1        Latest Ref Rng & Units 12/09/2020    8:53 AM 11/09/2019    9:39 AM 07/20/2019    8:50 AM  CBC  WBC 3.4 - 10.8 x10E3/uL 7.8  7.2  8.7   Hemoglobin 13.0 - 17.7 g/dL 13.7  9.7  11.4   Hematocrit 37.5 - 51.0 % 40.5  31.0  33.8   Platelets 150 - 450 x10E3/uL 161  172  177      Lipid Panel     Component Value Date/Time   CHOL 118 12/09/2020 0853   CHOL 116 03/30/2013 1053   TRIG 104 12/09/2020 0853   TRIG 80 03/30/2013 1053   HDL 36 (L) 12/09/2020 0853   HDL 34 (L) 03/30/2013 1053   CHOLHDL 3.3 12/09/2020 0853   CHOLHDL 2.5 03/31/2018 0508   VLDL 14 03/31/2018 0508   LDLCALC 62 12/09/2020 0853   LDLCALC 66 03/30/2013 1053     RADIOLOGY: No results found.  ECHO 04/10/2119 IMPRESSIONS   1. Left ventricular ejection fraction, by estimation, is 50 to 55%. The  left ventricle has low normal function. The left ventricle has no regional  wall motion abnormalities. Left ventricular diastolic parameters are  consistent with Grade II diastolic  dysfunction (pseudonormalization).   2. Right ventricular systolic function is normal. The right ventricular  size is normal. There is moderately elevated pulmonary artery systolic  pressure.   3. Left atrial size was moderately dilated.   4. The mitral valve is normal in structure. Trivial mitral valve  regurgitation. No evidence of mitral stenosis.   5. The aortic valve is  grossly normal. Aortic valve regurgitation is  trivial. No aortic stenosis is present.   IMPRESSION:  1. Coronary artery disease involving coronary bypass graft of native heart with angina pectoris ([place])   2. Hx of CABG   3. Essential hypertension   4. SOB (shortness of breath)   5. Hyperlipidemia LDL goal <70   6. Difficulty swallowing solids   7. Arthritis     ASSESSMENT AND PLAN:  Mr. Kwong is a 81 year old Caucasian male who has known CAD and is 22 years status post  CABG revascularization surgery in 2001. His catheterization in 2004 for recurrent chest pain showed patent grafts. His ejection fraction of 40-45% was documented in August 2012.  In October 2018 an echo revealed an EF of 60 to 65% with grade 2 diastolic dysfunction, aortic valve sclerosis without stenosis mild left atrial dilation.  An echo in December 2019 continued to show an EF of 60 to 65%.  There was now grade 1 diastolic dysfunction.  There was mild aortic stenosis. A Lexiscan Myoview study on January 04, 2018 was low risk and showed an EF of 51% without ischemia.  He developed  pancreatitis and felt to have acsending cholangitis with E. coli sepsis.  He underwent sphincterotomy and stent placement in his bile duct by Dr. Justice Britain.  He continued to have issues with significant shortness of breath which is stable but chronic.  His most recent catheterization from January 05, 2019  showed a 99% subtotally occluded vein graft to his right PDA/PL vessel which was treated successfully with 3 overlapping Resolute Onyx DES stents.  It was recommended that he continue aspirin/Plavix indefinitely.  Cardiovascularly, he presently remains stable and is not having any anginal symptomatology  or significant increasing shortness of breath.  He continues to cut his grass using a riding lawnmower.  Recently he has had difficulty swallowing solids and in the past has required esophageal dilatation.  He has a GI evaluation upcoming  in November.  His blood pressure today was 140/62.  Resting heart rate is 59 on nebivolol 10 mg daily.  There is no significant edema and he takes furosemide only as needed.  He continues to be on combination therapy with Zetia 10 mg and rosuvastatin 10 mg for hyperlipidemia with target LDL less than 70 and preferably less than 55.  He continues to see Dr. Isaac Bliss for primary care.  His last echo Doppler study was in March 2021 which showed low normal LV function with EF 50 to 55% without wall motion abnormalities.  There was grade 2 diastolic dysfunction and moderate left atrial dilatation.  I have recommended that in 6 months he undergo a follow-up echo Doppler study which will be 3 years since his last evaluation now we will see him in the office for further evaluation and recommendations.   Troy Sine, MD, [facility]  11/21/2021 12:40 PM

## 2021-11-19 NOTE — Patient Instructions (Addendum)
Medication Instructions:  Continue same medications *If you need a refill on your cardiac medications before your next appointment, please call your pharmacy*   Lab Work: None ordered   Testing/Procedures: Schedule Echo in 6 months 05/2022   Follow-Up: At [facility], you and your health needs are our priority.  As part of our continuing mission to provide you with exceptional heart care, we have created designated Provider Care Teams.  These Care Teams include your primary Cardiologist (physician) and Advanced Practice Providers (APPs -  Physician Assistants and Nurse Practitioners) who all work together to provide you with the care you need, when you need it.  We recommend signing up for the patient portal called "MyChart".  Sign up information is provided on this After Visit Summary.  MyChart is used to connect with patients for Virtual Visits (Telemedicine).  Patients are able to view lab/test results, encounter notes, upcoming appointments, etc.  Non-urgent messages can be sent to your provider as well.   To learn more about what you can do with MyChart, go to [email or web address].    Your next appointment:  6 months after Echo    The format for your next appointment:     Provider:  [facility]   Important Information About Sugar

## 2021-11-20 ENCOUNTER — Other Ambulatory Visit: Payer: Self-pay | Admitting: Cardiovascular Disease

## 2021-11-20 DIAGNOSIS — R0609 Other forms of dyspnea: Secondary | ICD-10-CM

## 2021-11-21 ENCOUNTER — Encounter: Payer: Self-pay | Admitting: Cardiovascular Disease

## 2021-12-04 ENCOUNTER — Encounter: Payer: Self-pay | Admitting: Internal Medicine

## 2021-12-04 ENCOUNTER — Ambulatory Visit (INDEPENDENT_AMBULATORY_CARE_PROVIDER_SITE_OTHER): Payer: Medicare Other | Admitting: Internal Medicine

## 2021-12-04 VITALS — BP 149/70 | HR 64 | Temp 98.4°F | Wt 175.0 lb

## 2021-12-04 DIAGNOSIS — E119 Type 2 diabetes mellitus without complications: Secondary | ICD-10-CM

## 2021-12-04 DIAGNOSIS — I10 Essential (primary) hypertension: Secondary | ICD-10-CM

## 2021-12-04 DIAGNOSIS — G894 Chronic pain syndrome: Secondary | ICD-10-CM | POA: Diagnosis not present

## 2021-12-04 DIAGNOSIS — E782 Mixed hyperlipidemia: Secondary | ICD-10-CM | POA: Diagnosis not present

## 2021-12-04 LAB — POCT GLYCOSYLATED HEMOGLOBIN (HGB A1C): Hemoglobin A1C: 5.9 % — AB (ref 4.0–5.6)

## 2021-12-04 MED ORDER — OXYCODONE-ACETAMINOPHEN 10-325 MG PO TABS: 1.0000 | ORAL_TABLET | Freq: Two times a day (BID) | ORAL | 0 refills | Status: DC | PRN

## 2021-12-04 MED ORDER — OXYCODONE-ACETAMINOPHEN 10-325 MG PO TABS: 1.0000 | ORAL_TABLET | Freq: Two times a day (BID) | ORAL | 0 refills | Status: DC

## 2021-12-04 NOTE — Progress Notes (Signed)
Established Patient Office Visit     CC/Reason for Visit: 26-monthfollow-up chronic conditions  HPI: Jacob WAILESis a 81y.o. male who is coming in today for the above mentioned reasons. Past Medical History is significant for:  Type 2 diabetes that has been well controlled, heart failure with preserved ejection fraction, chronic pain syndrome with multiple joint pain including neck, shoulders, hands, hips, knees.  He has a history of ascending cholangitis and pancreatitis due to biliary strictures, choledocholithiasis followed by Dr. MRush Landmark  He is on a chronic pain contract with me for oxycodone.  He is due for medication refills per contract.  He is otherwise feeling well.  He was trimming trees on his property and had a branch hit him causing a large skin tear and abrasion on his left upper arm.  It has been healing well.  Past Medical/Surgical History: Past Medical History:  Diagnosis Date   Allergy    B12 deficiency anemia    Blood transfusion without reported diagnosis    CAD (coronary artery disease)    Cancer (HCC)    bladder-    Colon polyps    COPD (chronic obstructive pulmonary disease) (HCC)    Depression    Diabetes mellitus (HCC)    Esophagus, Barrett's    GERD (gastroesophageal reflux disease)    History of bladder cancer    Bladder cancer "8 times"   History of hiatal hernia    Hyperlipidemia    Hypertension    Localized osteoarthrosis, lower leg    Myocardial infarction ([place]   Restless leg syndrome    Sleep apnea    does not wear cpap   Stenosis of esophagus     Past Surgical History:  Procedure Laterality Date   BILIARY BRUSHING  04/01/2018   Procedure: BILIARY BRUSHING;  Surgeon: MIrving Copas, MD;  [facility]  Service: Gastroenterology;;   BILIARY BRUSHING  09/12/2018   Procedure: BILIARY BRUSHING;  Surgeon: MIrving Copas, MD;  [facility]  Service: Gastroenterology;;   BILIARY BRUSHING   11/28/2018   Procedure: BILIARY BRUSHING;  Surgeon: MIrving Copas, MD;  [place]  Service: Gastroenterology;;   BILIARY BRUSHING  03/11/2020   Procedure: BILIARY BRUSHING;  Surgeon: MIrving Copas, MD;  [facility];  Service: Gastroenterology;;   BILIARY DILATION  09/12/2018   Procedure: BILIARY DILATION;  Surgeon: MIrving Copas, MD;  [place]  Service: Gastroenterology;;   BILIARY DILATION  11/28/2018   Procedure: BILIARY DILATION;  Surgeon: MIrving Copas, MD;  [place]  Service: Gastroenterology;;   BILIARY DILATION  03/11/2020   Procedure: BILIARY DILATION;  Surgeon: MIrving Copas, MD;  [facility];  Service: Gastroenterology;;   BILIARY STENT PLACEMENT  04/01/2018   Procedure: BILIARY STENT PLACEMENT;  Surgeon: MIrving Copas, MD;  [place]  Service: Gastroenterology;;   BILIARY STENT PLACEMENT  09/12/2018   Procedure: BILIARY STENT PLACEMENT;  Surgeon: MIrving Copas, MD;  [place]  Service: Gastroenterology;;   BILIARY STENT PLACEMENT  11/28/2018   Procedure: BILIARY STENT PLACEMENT;  Surgeon: MIrving Copas, MD;  [place]  Service: Gastroenterology;;   BIOPSY  04/01/2018   Procedure: BIOPSY;  Surgeon: MIrving Copas, MD;  [place]  Service: Gastroenterology;;   BIOPSY  09/12/2018   Procedure: BIOPSY;  Surgeon: MIrving Copas, MD;  [place]  Service: Gastroenterology;;   BIOPSY  11/28/2018  Procedure: BIOPSY;  Surgeon: Irving Copas., MD;  Location: [place];  Service: Gastroenterology;;   BIOPSY  03/11/2020   Procedure: BIOPSY;  Surgeon: Irving Copas., MD;  [facility];  Service: Gastroenterology;;   bladder cancer      x 8 cystoscopy   CERVICAL DISCECTOMY     ACDF   COLONOSCOPY  11/17/2005   normal    CORONARY ARTERY BYPASS  GRAFT     x4   CORONARY STENT INTERVENTION N/A 01/05/2019   Procedure: CORONARY STENT INTERVENTION;  Surgeon: Nelva Bush, MD;  Location: [place] [facility];  Service: Cardiovascular;  Laterality: N/A;   ENDOSCOPIC MUCOSAL RESECTION  09/12/2018   Procedure: ENDOSCOPIC MUCOSAL RESECTION;  Surgeon: Rush Landmark, Telford Nab., MD;  [facility];  Service: Gastroenterology;;   ENDOSCOPIC RETROGRADE CHOLANGIOPANCREATOGRAPHY (ERCP) WITH PROPOFOL N/A 04/01/2018   Procedure: ENDOSCOPIC RETROGRADE CHOLANGIOPANCREATOGRAPHY (ERCP) WITH PROPOFOL;  Surgeon: Irving Copas., MD;  Location: [place];  Service: Gastroenterology;  Laterality: N/A;   ENDOSCOPIC RETROGRADE CHOLANGIOPANCREATOGRAPHY (ERCP) WITH PROPOFOL N/A 09/12/2018   Procedure: ENDOSCOPIC RETROGRADE CHOLANGIOPANCREATOGRAPHY (ERCP) WITH PROPOFOL;  Surgeon: Rush Landmark Telford Nab., MD;  Location: [place];  Service: Gastroenterology;  Laterality: N/A;   ENDOSCOPIC RETROGRADE CHOLANGIOPANCREATOGRAPHY (ERCP) WITH PROPOFOL N/A 03/11/2020   Procedure: ENDOSCOPIC RETROGRADE CHOLANGIOPANCREATOGRAPHY (ERCP) WITH PROPOFOL;  Surgeon: Rush Landmark Telford Nab., MD;  [facility];  Service: Gastroenterology;  Laterality: N/A;   ENDOSCOPIC RETROGRADE CHOLANGIOPANCREATOGRAPHY (ERCP) WITH PROPOFOL N/A 06/03/2020   Procedure: ENDOSCOPIC RETROGRADE CHOLANGIOPANCREATOGRAPHY (ERCP) WITH PROPOFOL;  Surgeon: Rush Landmark Telford Nab., MD;  [facility];  Service: Gastroenterology;  Laterality: N/A;   ERCP N/A 11/28/2018   Procedure: ENDOSCOPIC RETROGRADE CHOLANGIOPANCREATOGRAPHY (ERCP) +EGD with spyglass;  Surgeon: Rush Landmark Telford Nab., MD;  Location: [place];  Service: Gastroenterology;  Laterality: N/A;   ESOPHAGOGASTRODUODENOSCOPY  04/29/2010   ESOPHAGOGASTRODUODENOSCOPY (EGD) WITH PROPOFOL N/A 04/01/2018   Procedure: ESOPHAGOGASTRODUODENOSCOPY (EGD) WITH PROPOFOL;  Surgeon: Rush Landmark Telford Nab., MD;  Location: [place];  Service: Gastroenterology;  Laterality: N/A;   ESOPHAGOGASTRODUODENOSCOPY (EGD) WITH PROPOFOL N/A 09/12/2018   Procedure: ESOPHAGOGASTRODUODENOSCOPY (EGD) WITH PROPOFOL;  Surgeon: Rush Landmark Telford Nab., MD;  Location: [place];  Service: Gastroenterology;  Laterality: N/A;   ESOPHAGOGASTRODUODENOSCOPY (EGD) WITH PROPOFOL N/A 11/28/2018   Procedure: ESOPHAGOGASTRODUODENOSCOPY (EGD) WITH PROPOFOL;  Surgeon: Rush Landmark Telford Nab., MD;  Location: [place];  Service: Gastroenterology;  Laterality: N/A;   ESOPHAGOGASTRODUODENOSCOPY (EGD) WITH PROPOFOL N/A 03/11/2020   Procedure: ESOPHAGOGASTRODUODENOSCOPY (EGD) WITH PROPOFOL;  Surgeon: Rush Landmark Telford Nab., MD;  [facility];  Service: Gastroenterology;  Laterality: N/A;   ESOPHAGOGASTRODUODENOSCOPY (EGD) WITH PROPOFOL N/A 06/03/2020   Procedure: ESOPHAGOGASTRODUODENOSCOPY (EGD) WITH PROPOFOL;  Surgeon: Rush Landmark Telford Nab., MD;  [facility];  Service: Gastroenterology;  Laterality: N/A;   EUS  04/01/2018   Procedure: FULL UPPER ENDOSCOPIC ULTRASOUND (EUS) RADIAL;  Surgeon: Irving Copas., MD;  Location: [place];  Service: Gastroenterology;;   EUS N/A 09/12/2018   Procedure: UPPER ENDOSCOPIC ULTRASOUND (EUS) RADIAL;  Surgeon: Irving Copas., MD;  Location: [place];  Service: Gastroenterology;  Laterality: N/A;   FINE NEEDLE ASPIRATION  09/12/2018   Procedure: FINE NEEDLE ASPIRATION (FNA) LINEAR;  Surgeon: Irving Copas., MD;  Location: [place];  Service: Gastroenterology;;   HAND SURGERY Left 2018   saw accident   HEMOSTASIS CLIP PLACEMENT  09/12/2018   Procedure: HEMOSTASIS CLIP PLACEMENT;  Surgeon: Irving Copas., MD;  Location: [place];  Service: Gastroenterology;;   HEMOSTASIS CLIP PLACEMENT  06/03/2020   Procedure: HEMOSTASIS CLIP PLACEMENT;  Surgeon: Irving Copas., MD;  [facility];  Service: Gastroenterology;;   I & D EXTREMITY  Left 11/24/2016   Procedure: IRRIGATION AND DEBRIDEMENT LEFT HAND, THUMB, INDEX, MIDDLE, RING, AND SMALL FINGERS WITH RECONSTRUCTION;  Surgeon: Roseanne Kaufman, MD;  Location: [place];  Service: Orthopedics;  Laterality: Left;   KNEE ARTHROSCOPY Left    LEFT HEART CATH AND CORS/GRAFTS ANGIOGRAPHY N/A 01/05/2019   Procedure: LEFT HEART CATH AND CORS/GRAFTS ANGIOGRAPHY;  Surgeon: Nelva Bush, MD;  Location: [place] [facility];  Service: Cardiovascular;  Laterality: N/A;   LUMBAR LAMINECTOMY     and fusion x 2   NASAL SINUS SURGERY     POPLITEAL SYNOVIAL CYST EXCISION     REMOVAL OF STONES  04/01/2018   Procedure: REMOVAL OF STONES;  Surgeon: Rush Landmark Telford Nab., MD;  Location: [place];  Service: Gastroenterology;;   REMOVAL OF STONES  09/12/2018   Procedure: REMOVAL OF STONES;  Surgeon: Irving Copas., MD;  Location: [place];  Service: Gastroenterology;;   REMOVAL OF STONES  11/28/2018   Procedure: REMOVAL OF STONES;  Surgeon: Irving Copas., MD;  Location: [place];  Service: Gastroenterology;;   REMOVAL OF STONES  03/11/2020   Procedure: REMOVAL OF STONES;  Surgeon: Irving Copas., MD;  [facility];  Service: Gastroenterology;;   REMOVAL OF STONES  06/03/2020   Procedure: REMOVAL OF STONES;  Surgeon: Irving Copas., MD;  [facility];  Service: Gastroenterology;;   Azzie Almas DILATION N/A 09/12/2018   Procedure: Renato Battles;  Surgeon: Irving Copas., MD;  Location: [place];  Service: Gastroenterology;  Laterality: N/A;   SAVORY DILATION N/A 11/28/2018   Procedure: SAVORY DILATION;  Surgeon: Rush Landmark Telford Nab., MD;  Location: [place];  Service: Gastroenterology;  Laterality: N/A;   SEPTOPLASTY Bilateral 05/26/2021   Procedure: SEPTOPLASTY;  Surgeon: Leta Baptist, MD;  Location: [place];  Service: ENT;  Laterality: Bilateral;   SPHINCTEROTOMY  04/01/2018   Procedure: Joan Mayans;   Surgeon: Mansouraty, Telford Nab., MD;  Location: [place];  Service: Gastroenterology;;   Bess Kinds CHOLANGIOSCOPY N/A 11/28/2018   Procedure: [id number] CHOLANGIOSCOPY;  Surgeon: Irving Copas., MD;  Location: [place];  Service: Gastroenterology;  Laterality: N/A;   SPYGLASS CHOLANGIOSCOPY N/A 06/03/2020   Procedure: SPYGLASS CHOLANGIOSCOPY;  Surgeon: Irving Copas., MD;  [facility];  Service: Gastroenterology;  Laterality: N/A;   STENT REMOVAL  09/12/2018   Procedure: STENT REMOVAL;  Surgeon: Irving Copas., MD;  Location: [place];  Service: Gastroenterology;;   Lavell Islam REMOVAL  11/28/2018   Procedure: STENT REMOVAL;  Surgeon: Irving Copas., MD;  Location: [place];  Service: Gastroenterology;;   Lavell Islam REMOVAL  03/11/2020   Procedure: STENT REMOVAL;  Surgeon: Irving Copas., MD;  [facility];  Service: Gastroenterology;;   SUBMUCOSAL LIFTING INJECTION  09/12/2018   Procedure: SUBMUCOSAL LIFTING INJECTION;  Surgeon: Irving Copas., MD;  Location: [place];  Service: Gastroenterology;;    Social History:  reports that he quit smoking about 45 years ago. His smoking use included cigarettes. He has a 2.50 pack-year smoking history. He has never used smokeless tobacco. He reports that he does not drink alcohol and does not use drugs.  Allergies: No Active Allergies  Family History:  Family History  Problem Relation Age of Onset   Melanoma Mother    Stroke Father    Hypertension Father    Coronary artery disease Other    Colon cancer Neg Hx  Esophageal cancer Neg Hx    Stomach cancer Neg Hx    Rectal cancer Neg Hx    Pancreatic cancer Neg Hx    Liver disease Neg Hx    Inflammatory bowel disease Neg Hx      Current Outpatient Medications:    acetaminophen (TYLENOL) 500 MG tablet, Take 1,000 mg by mouth every 6 (six) hours as needed for moderate pain., Disp: , Rfl:    amLODipine (NORVASC) 2.5  MG tablet, Take 2 tablets (5 mg total) by mouth daily. KEEP UPCOMING APPOINTMENT FOR FUTURE REFILLS., Disp: 180 tablet, Rfl: 0   cetirizine (ZYRTEC) 10 MG tablet, Take 10 mg by mouth daily as needed for allergies., Disp: , Rfl:    clopidogrel (PLAVIX) 75 MG tablet, TAKE 1 TABLET BY MOUTH ONCE  DAILY, Disp: 60 tablet, Rfl: 0   Coenzyme Q10 300 MG CAPS, Take 300 mg by mouth every evening., Disp: , Rfl:    cyanocobalamin (VITAMIN B12) 1000 MCG/ML injection, INJECT 1 ML  SUBCUTANEOUSLY ONCE EVERY MONTH, Disp: 3 mL, Rfl: 0   dextromethorphan-guaiFENesin (MUCINEX DM) 30-600 MG 12hr tablet, Take 1 tablet by mouth 2 (two) times daily., Disp: , Rfl:    ezetimibe (ZETIA) 10 MG tablet, TAKE 1 TABLET BY MOUTH DAILY, Disp: 100 tablet, Rfl: 1   Ferrous Sulfate (IRON) 28 MG TABS, Take 28 mg by mouth daily., Disp: , Rfl:    furosemide (LASIX) 40 MG tablet, Take 1 tablet (40 mg total) by mouth every other day. KEEP UPCOMING APPOINTMENT FOR FUTURE REFILLS., Disp: 15 tablet, Rfl: 0   guaifenesin (HUMIBID E) 400 MG TABS tablet, Take 400 mg by mouth daily., Disp: , Rfl:    hyoscyamine (LEVSIN SL) 0.125 MG SL tablet, Place 1 tablet (0.125 mg total) under the tongue every 4 (four) hours as needed. (Patient taking differently: Place 0.125 mg under the tongue every 4 (four) hours as needed (spasms).), Disp: 30 tablet, Rfl: 2   isosorbide mononitrate (IMDUR) 60 MG 24 hr tablet, Take 1 tablet (60 mg total) by mouth daily. KEEP UPCOMING APPOINTMENT FOR FUTURE REFILLS., Disp: 90 tablet, Rfl: 0   Menthol, Topical Analgesic, (BENGAY EX), Apply 1 application topically daily as needed (pain)., Disp: , Rfl:    METAMUCIL FIBER PO, Take 3 capsules by mouth daily., Disp: , Rfl:    Multiple Vitamins-Minerals (MULTIVITAMIN WITH MINERALS) tablet, Take 1 tablet by mouth daily., Disp: , Rfl:    Naphazoline-Pheniramine (OPCON-A) 0.027-0.315 % SOLN, Place 1 drop into both eyes daily as needed (itching eyes)., Disp: , Rfl:    nebivolol  (BYSTOLIC) 10 MG tablet, Take 1 tablet (10 mg total) by mouth daily. Please schedule appointment for additional refills., Disp: 100 tablet, Rfl: 1   NEEDLE, DISP, 25 G (B-D DISP NEEDLE 25GX1") 25G X 1" MISC, Inject 1000 mcg into muscle once a month., Disp: 50 each, Rfl: 0   nitroGLYCERIN (NITROSTAT) 0.4 MG SL tablet, PLACE 1 TABLET UNDER THE TONGUE EVERY 5 MINUTES AS NEEDED FOR CHEST PAIN, Disp: 25 tablet, Rfl: 2   pantoprazole (PROTONIX) 40 MG tablet, Take 1 tablet by mouth once daily, Disp: 90 tablet, Rfl: 0   Probiotic Product (PROBIOTIC ADVANCED PO), Take 1 tablet by mouth every evening., Disp: , Rfl:    rOPINIRole (REQUIP) 2 MG tablet, Take 1 tablet (2 mg total) by mouth at bedtime., Disp: 90 tablet, Rfl: 1   rosuvastatin (CRESTOR) 10 MG tablet, TAKE 1 TABLET BY MOUTH DAILY, Disp: 60 tablet, Rfl: 5   venlafaxine (EFFEXOR)  75 MG tablet, Take 1 tablet (75 mg total) by mouth daily., Disp: 90 tablet, Rfl: 1   oxyCODONE-acetaminophen (PERCOCET) 10-325 MG tablet, Take 1 tablet by mouth 2 (two) times daily as needed for pain., Disp: 60 tablet, Rfl: 0   oxyCODONE-acetaminophen (PERCOCET) 10-325 MG tablet, Take 1 tablet by mouth 2 (two) times daily as needed for pain., Disp: 60 tablet, Rfl: 0   oxyCODONE-acetaminophen (PERCOCET) 10-325 MG tablet, Take 1 tablet by mouth 2 (two) times daily., Disp: 60 tablet, Rfl: 0  Review of Systems:  Constitutional: Denies fever, chills, diaphoresis, appetite change and fatigue.  HEENT: Denies photophobia, eye pain, redness, hearing loss, ear pain, congestion, sore throat, rhinorrhea, sneezing, mouth sores, trouble swallowing, neck pain, neck stiffness and tinnitus.   Respiratory: Denies SOB, DOE, cough, chest tightness,  and wheezing.   Cardiovascular: Denies chest pain, palpitations and leg swelling.  Gastrointestinal: Denies nausea, vomiting, abdominal pain, diarrhea, constipation, blood in stool and abdominal distention.  Genitourinary: Denies dysuria, urgency,  frequency, hematuria, flank pain and difficulty urinating.  Endocrine: Denies: hot or cold intolerance, sweats, changes in hair or nails, polyuria, polydipsia. Musculoskeletal: Denies myalgias, back pain, joint swelling, arthralgias and gait problem.  Skin: Denies pallor, rash and wound.  Neurological: Denies dizziness, seizures, syncope, weakness, light-headedness, numbness and headaches.  Hematological: Denies adenopathy. Easy bruising, personal or family bleeding history  Psychiatric/Behavioral: Denies suicidal ideation, mood changes, confusion, nervousness, sleep disturbance and agitation    Physical Exam: Vitals:   12/04/21 0936 12/04/21 0941  BP: (!) 150/78 (!) 149/70  Pulse: 64   Temp: 98.4 F (36.9 C)   TempSrc: Oral   SpO2: 96%   Weight: 175 lb (79.4 kg)     Body mass index is 25.84 kg/m.   Constitutional: NAD, calm, comfortable Eyes: PERRL, lids and conjunctivae normal ENMT: Mucous membranes are moist.  Respiratory: clear to auscultation bilaterally, no wheezing, no crackles. Normal respiratory effort. No accessory muscle use.  Cardiovascular: Regular rate and rhythm, no murmurs / rubs / gallops. No extremity edema.  Psychiatric: Normal judgment and insight. Alert and oriented x 3. Normal mood.    Impression and Plan:  Diabetes mellitus type 2, noninsulin dependent ([place]) - Plan: POCT glycosylated hemoglobin (Hb A1C)  Chronic pain syndrome - Plan: oxyCODONE-acetaminophen (PERCOCET) 10-325 MG tablet, oxyCODONE-acetaminophen (PERCOCET) 10-325 MG tablet, oxyCODONE-acetaminophen (PERCOCET) 10-325 MG tablet  Mixed hyperlipidemia  Essential hypertension  -A1c of 5.9 demonstrates excellent diabetic control. -PDMP reviewed, no red flags, overdose risk score is 50. -Refill oxycodone 10/325 to take 1 tablet twice daily for total of 60 tablets a month x3 months. -Cholesterol was at goal as of last check. -Blood pressure is elevated today, he states he believes this is  because he forgot to take his pain pill this morning.  He will do ambulatory blood pressure monitoring and return in 3 months for follow-up.  Time spent:32 minutes reviewing chart, interviewing and examining patient and formulating plan of care.      [name], MD [facility] at [facility]

## 2021-12-14 ENCOUNTER — Other Ambulatory Visit: Payer: Self-pay | Admitting: Internal Medicine

## 2021-12-16 NOTE — Progress Notes (Addendum)
12/16/2021 [name] [id number] [birth year]   Chief Complaint: Dysphagia, rectal and groin pain   History of Present Illness: [name] is a 81 year old male with a past medical history of depression, hypertension, coronary artery disease s/p 2 vessel CABG 2001, s/p DES 12//2020, COPD, sleep apnea does not use CPAP, diabetes mellitus type II, restless leg syndrome, bladder cancer, GERD, Barrett's esophagus, choledocholithiasis with a distal  biliary stricture s/p multiple ERCPs with stone extraction and 3 biliary stent placement with subsequent bilary stent removal 02/2020, prior pancreatitis secondary to gallstone disease and history of elevated Ig4 level. S/P cholecystectomy 12/2018.   He presents today for further evaluation regarding dysphagia, rectal and groin pain. He describes having food which gets stuck to his mid to lower esophagus which occurs once or twice weekly then goes away for a month or two. Foods such as hot dogs, chicken and watermelon are problematic. He denies having any heartburn.  No upper or lower abdominal pain.  No nausea or vomiting. He takes Pantoprazole '40mg'$  once daily.  He is under gone numerous EGDs in the past, his most recent EGD was 09/12/2018 which showed Barrett's esophagus without dysplasia, gastritis and a hyperplastic gastric polyp.  No evidence of H. pylori.  The esophagus was dilated.  He complains of having external anal pain for the past 10 years. No external hemorrhoids, rectal bleeding or unusual rectal discharged. Sitting in a chair or sleeping on his back worsens his anal pain. He denies having any anorectal pain when passing a BM. He has right and left groin pain x 2 years which is worse when he is actively urinating or passing a BM. No noticeable bulges in groin area.  He last saw his urologist about 6 months ago.  Normal colonoscopy 12/29/2017 which identified removed from the descending colon and diverticulosis to the ascending and  sigmoid colon.  MOST RECENT GI PROCEDURES:  ERCP 06/03/2020: - Prior biliary sphincterotomy appeared open. - The entire main bile duct was moderately dilated. - A filling defect was noted that was felt to be more likely air rather than stone on fluoroscopy. - The biliary tree was swept and very small amount of sludge was found. Stones were not removed. - The filling defect looked to have been removed completely, but the size of the previous stone on CT scan made me feel need to ensure it was gone. SpyGlass interrogation was performed. There was mild narrowing in the distal CBD, but the majority of the rest of the biliary tract had normal mucosa. - Incidental mucosal tear noted after Duodenoscope/SpyScope positioning loss in the D1/D2 angle. Five hemostatic clips were successfully placed (MR conditional) to close this defect.  ERCP 03/11/2020: - No gross lesions in esophagus proximally. Salmon-colored mucosal islands suspicious for short-segment Barrett's esophagus - biopsied. - Erythematous mucosa in the antrum. No other gross lesions in the stomach. Biopsied. - No gross lesions in the duodenal bulb, in the first portion of the duodenum and in the second portion of the duodenum. - Prior biliary sphincterotomy appeared open. Three stents from the biliary tree were seen in the major papilla - these were removed and sent for cytology. - The fluoroscopic examination was suspicious for sludge as well as biliary dilation in the main duct. - A fine tapering was noted distally. Not clearly a stricture but could be narrowing. This  was dilated and brushed for cytology. - Choledocholithiasis was found. Complete removal was accomplished by sweeping. - At  end of procedure, the biliary tree was swept and nothing was found. - Pull through of the 12 mm balloon through the entire duct was felt to be adequate so I did not replace the previous plastic stents for a trial of how the patient does without  stenting in regards to LFT pattern and overall clinical scenario.  ERCP 11/28/2018: second portion of the duodenum. - Prior biliary sphincterotomy appeared open. - Two visibly patent stents from the biliary tree were seen in the major papilla. These were removed and sent for cytology. - The fluoroscopic examination was suspicious for sludge/debris. - A single localized biliary stricture was found in the lower third of the main bile duct. The stricture was indeterminate. This was dilated. This was brushed for cytology. - Choledocholithiasis and biliary sludge was found. Complete removal was accomplished by sweeping on multiple occasions. Occlusion cholangiogram showed a clear duct at end of procedure. - Moderately narrowed lumen in the biliary tract visualized via SpyGlass. This was biopsied. - Three plastic biliary stents were placed into the common bile duct to aid in dilation of the narrowing. - Overall, suspect this is more an inflammatory stricture/stenosis from stone disease. The consideration of an IgG4 mediated process remains. However, there is no doubt that the patient continues to drop stones/sludge from the gallbladder into the duct as evidenced on last 2 ERCPs of significant stone/debris being removed  EGD/EUS/ERCP 09/12/2018: - Prior biliary sphincterotomy appeared open. - Filling defects consistent with stones and sludge were seen on the cholangiogram. - A single moderate biliary stricture was found in the lower third of the main bile duct. The stricture was indeterminate. This stricture was dilated. This stricture was brushed for cytology. Two plastic biliary stents were placed into the common bile duct to traverse the stricture. - The entire main bile duct was severely dilated, secondary to aforementioned stricture. - Choledocholithiasis and clot/debris was found. Complete removal was accomplished by sweeping after sphincteroplasty. EGD Impression: - No gross lesions in  proximal/middle esophagus. Salmon-colored mucosa suspicious for short-segment Barrett's esophagus. Biopsied. Dilation of esophagus performed. - Gastritis. Biopsied. - A single gastric polyp. Resected and retrieved via mucosal resection. Clips (MR conditional) were placed to close the defect. - No gross lesions in the duodenal bulb, in the first portion of the duodenum and in the second portion of the duodenum. Biopsied for enteropathy/celiac rule out. - Plastic biliary stent in the duodenum. Removed. - Patent sphincterotomy was found. EUS Impression: - There was a suggestion of a stricture in the lower third of the main bile duct. Fine needle biopsy performed of the region (although no overt mass was noted significant thickening present). - Multiple stones were visualized endosonographically in the common bile duct. - There was dilation in the common bile duct and in the common hepatic duct which measured up to 17 mm. - The pancreatic duct had a normal endosonographic appearance in the pancreatic head, genu of the pancreas, body of the pancreas and tail of the pancreas. - Pancreatic parenchymal abnormalities consisting of diffuse echogenicity and lobularity were noted in the entire pancreas. - No malignant-appearing lymph nodes were visualized in the celiac region (level 20), peripancreatic region and porta hepatis region. 1. Duodenum, Biopsy - BENIGN SMALL BOWEL MUCOSA. - NO ACTIVE INFLAMMATION OR VILLOUS ATROPHY IDENTIFIED. 2. Stomach, biopsy - CHRONIC INACTIVE GASTRITIS. - THERE IS NO EVIDENCE OF HELICOBACTER PYLORI, DYSPLASIA OR MALIGNANCY. - SEE COMMENT. 3. Esophagus, biopsy - INTESTINAL METAPLASIA (GOBLET CELL METAPLASIA) CONSISTENT WITH BARRETT'S ESOPHAGUS. - THERE  IS NO EVIDENCE OF DYSPLASIA OR MALIGNANCY. 4. Esophagus, biopsy, random - BENIGN SQUAMOUS MUCOSA. - THERE IS NO EVIDENCE OF SIGNIFICANT INFLAMMATION, DYSPLASIA, OR MALIGNANCY. 5. Stomach, biopsy, pre-pyloric -  ULCERATED HYPERPLASTIC POLYP(S). - THERE IS NO EVIDENCE OF MALIGNANCY  EUS/ERCP 04/01/2018:   EGD Impression: - White nummular lesions on esophageal mucosa. Biopsied for Candida rule out. - Gastric mucosal atrophy at fundus. Biopsied. - Non-bleeding erosive gastropathy. Biopsied for HP. - Congested, erythematous, friable (with contact bleeding), granular, inflamed and texture changed mucosa in the pylorus. Biopsied to rule out adenoma or other processes.. - No gross lesions in the duodenal bulb, in the first portion of the duodenum and in the second portion of the duodenum. - Ampulla was flat. No bile was able to be suctioned out. EUS Impression: - Pancreatic parenchymal abnormalities consisting of diffuse echogenicity were noted in the genu of the pancreas, pancreatic body and pancreatic tail. The head of the pancreas due to the heterogeneity was not grossly abnormal however, the chance of missing subtle lesions with decreased sensitivity in setting of acute pancreatitis is possible. - The pancreatic duct had a normal endosonographic appearance in the genu of the pancreas, body of the pancreas and tail of the pancreas. - There was dilation in the common bile duct and in the common hepatic duct. - There was a suggestion of a stricture in the lower third of the main bile duct. - Hyperechoic material consistent with sludge was visualized endosonographically in the common bile duct above the likely stricture. - Hyperechoic material consistent with sludge was visualized endosonographically in the gallbladder - The major papilla appeared to be flat. - The fluoroscopic examination was suspicious for sludge. - A single moderate biliary stricture was found in the lower third of the main bile duct. The stricture was indeterminate. This was brushed for cytology. - A biliary sphincterotomy was performed. - The biliary tree was swept and sludge and debris were found. - One plastic biliary stent was  placed into the common bile duct and allowed good drainage at the end of the procedure of the biliary tree suggestive that stenosis was significant.  Colonoscopy 12/29/2017: - One 3 mm polyp in the descending colon, removed with a cold snare. Resected and retrieved. - Diverticulosis in the sigmoid colon and in the ascending colon. - The examination was otherwise normal. No cause for pain noted on this exam. 1. Surgical [P], gastric cardia nodule - HYPERPLASTIC POLYP(S). - THERE IS NO EVIDENCE OF MALIGNANCY. 2. Surgical [P], GE junction - GASTROESOPHAGEAL JUNCTION MUCOSA WITH MILD INFLAMMATION CONSISTENT WITH GASTROESOPHAGEAL REFLUX. - THERE IS NO EVIDENCE OF GOBLET CELL METAPLASIA, DYSPLASIA, OR MALIGNANCY. 3. Surgical [P], descending colon, polyp - TUBULAR ADENOMA(S). - HIGH GRADE DYSPLASIA IS NOT IDENTIFIED  ECHO 04/10/2019: 1. Left ventricular ejection fraction, by estimation, is 50 to 55%. The left ventricle has low normal function. The left ventricle has no regional wall motion abnormalities. Left ventricular diastolic parameters are consistent with Grade II diastolic dysfunction (pseudonormalization). 2. Right ventricular systolic function is normal. The right ventricular size is normal. There is moderately elevated pulmonary artery systolic pressure. 3. Left atrial size was moderately dilated. 4. The mitral valve is normal in structure. Trivial mitral valve regurgitation. No evidence of mitral stenosis. 5. The aortic valve is grossly normal. Aortic valve regurgitation is trivial. No aortic stenosis is present.   Past Medical History:  Diagnosis Date   Allergy    B12 deficiency anemia    Blood transfusion without reported diagnosis  CAD (coronary artery disease)    Cancer (HCC)    bladder-    Colon polyps    COPD (chronic obstructive pulmonary disease) (HCC)    Depression    Diabetes mellitus (HCC)    Esophagus, Barrett's    GERD (gastroesophageal reflux disease)     History of bladder cancer    Bladder cancer "8 times"   History of hiatal hernia    Hyperlipidemia    Hypertension    Localized osteoarthrosis, lower leg    Myocardial infarction ([place]) 2021   Restless leg syndrome    Sleep apnea    does not wear cpap   Stenosis of esophagus    Past Surgical History:  Procedure Laterality Date   BILIARY BRUSHING  04/01/2018   Procedure: BILIARY BRUSHING;  Surgeon: Irving Copas., MD;  Location: [facility] ENDOSCOPY;  Service: Gastroenterology;;   BILIARY BRUSHING  09/12/2018   Procedure: BILIARY BRUSHING;  Surgeon: Irving Copas., MD;  Location: [place];  Service: Gastroenterology;;   BILIARY BRUSHING  11/28/2018   Procedure: BILIARY BRUSHING;  Surgeon: Irving Copas., MD;  Location: [place];  Service: Gastroenterology;;   BILIARY BRUSHING  03/11/2020   Procedure: BILIARY BRUSHING;  Surgeon: Irving Copas., MD;  Location: [facility];  Service: Gastroenterology;;   BILIARY DILATION  09/12/2018   Procedure: BILIARY DILATION;  Surgeon: Irving Copas., MD;  Location: [place];  Service: Gastroenterology;;   BILIARY DILATION  11/28/2018   Procedure: BILIARY DILATION;  Surgeon: Irving Copas., MD;  Location: [place];  Service: Gastroenterology;;   BILIARY DILATION  03/11/2020   Procedure: BILIARY DILATION;  Surgeon: Irving Copas., MD;  Location: [facility] ENDOSCOPY;  Service: Gastroenterology;;   BILIARY STENT PLACEMENT  04/01/2018   Procedure: BILIARY STENT PLACEMENT;  Surgeon: Irving Copas., MD;  Location: [place];  Service: Gastroenterology;;   BILIARY STENT PLACEMENT  09/12/2018   Procedure: BILIARY STENT PLACEMENT;  Surgeon: Irving Copas., MD;  Location: [place];  Service: Gastroenterology;;   BILIARY STENT PLACEMENT  11/28/2018   Procedure: BILIARY STENT PLACEMENT;  Surgeon: Irving Copas., MD;  Location: [place];  Service: Gastroenterology;;    BIOPSY  04/01/2018   Procedure: BIOPSY;  Surgeon: Irving Copas., MD;  Location: [place];  Service: Gastroenterology;;   BIOPSY  09/12/2018   Procedure: BIOPSY;  Surgeon: Irving Copas., MD;  Location: [place];  Service: Gastroenterology;;   BIOPSY  11/28/2018   Procedure: BIOPSY;  Surgeon: Irving Copas., MD;  Location: [place];  Service: Gastroenterology;;   BIOPSY  03/11/2020   Procedure: BIOPSY;  Surgeon: Irving Copas., MD;  Location: [facility] ENDOSCOPY;  Service: Gastroenterology;;   bladder cancer      x 8 cystoscopy   CERVICAL DISCECTOMY     ACDF   COLONOSCOPY  11/17/2005   normal    CORONARY ARTERY BYPASS GRAFT     x4   CORONARY STENT INTERVENTION N/A 01/05/2019   Procedure: CORONARY STENT INTERVENTION;  Surgeon: Nelva Bush, MD;  Location: [place] [facility];  Service: Cardiovascular;  Laterality: N/A;   ENDOSCOPIC MUCOSAL RESECTION  09/12/2018   Procedure: ENDOSCOPIC MUCOSAL RESECTION;  Surgeon: Rush Landmark, Telford Nab., MD;  Location: [facility] ENDOSCOPY;  Service: Gastroenterology;;   ENDOSCOPIC RETROGRADE CHOLANGIOPANCREATOGRAPHY (ERCP) WITH PROPOFOL N/A 04/01/2018   Procedure: ENDOSCOPIC RETROGRADE CHOLANGIOPANCREATOGRAPHY (ERCP) WITH PROPOFOL;  Surgeon: Irving Copas., MD;  Location: [place];  Service: Gastroenterology;  Laterality: N/A;   ENDOSCOPIC RETROGRADE CHOLANGIOPANCREATOGRAPHY (ERCP) WITH PROPOFOL N/A  09/12/2018   Procedure: ENDOSCOPIC RETROGRADE CHOLANGIOPANCREATOGRAPHY (ERCP) WITH PROPOFOL;  Surgeon: Rush Landmark Telford Nab., MD;  Location: [place];  Service: Gastroenterology;  Laterality: N/A;   ENDOSCOPIC RETROGRADE CHOLANGIOPANCREATOGRAPHY (ERCP) WITH PROPOFOL N/A 03/11/2020   Procedure: ENDOSCOPIC RETROGRADE CHOLANGIOPANCREATOGRAPHY (ERCP) WITH PROPOFOL;  Surgeon: Rush Landmark Telford Nab., MD;  Location: [facility]OS[facility];  Service: Gastroenterology;  Laterality: N/A;   ENDOSCOPIC RETROGRADE  CHOLANGIOPANCREATOGRAPHY (ERCP) WITH PROPOFOL N/A 06/03/2020   Procedure: ENDOSCOPIC RETROGRADE CHOLANGIOPANCREATOGRAPHY (ERCP) WITH PROPOFOL;  Surgeon: Rush Landmark Telford Nab., MD;  Location: [facility]OS[facility];  Service: Gastroenterology;  Laterality: N/A;   ERCP N/A 11/28/2018   Procedure: ENDOSCOPIC RETROGRADE CHOLANGIOPANCREATOGRAPHY (ERCP) +EGD with spyglass;  Surgeon: Rush Landmark Telford Nab., MD;  Location: [place];  Service: Gastroenterology;  Laterality: N/A;   ESOPHAGOGASTRODUODENOSCOPY  04/29/2010   ESOPHAGOGASTRODUODENOSCOPY (EGD) WITH PROPOFOL N/A 04/01/2018   Procedure: ESOPHAGOGASTRODUODENOSCOPY (EGD) WITH PROPOFOL;  Surgeon: Rush Landmark Telford Nab., MD;  Location: [place];  Service: Gastroenterology;  Laterality: N/A;   ESOPHAGOGASTRODUODENOSCOPY (EGD) WITH PROPOFOL N/A 09/12/2018   Procedure: ESOPHAGOGASTRODUODENOSCOPY (EGD) WITH PROPOFOL;  Surgeon: Rush Landmark Telford Nab., MD;  Location: [place];  Service: Gastroenterology;  Laterality: N/A;   ESOPHAGOGASTRODUODENOSCOPY (EGD) WITH PROPOFOL N/A 11/28/2018   Procedure: ESOPHAGOGASTRODUODENOSCOPY (EGD) WITH PROPOFOL;  Surgeon: Rush Landmark Telford Nab., MD;  Location: [place];  Service: Gastroenterology;  Laterality: N/A;   ESOPHAGOGASTRODUODENOSCOPY (EGD) WITH PROPOFOL N/A 03/11/2020   Procedure: ESOPHAGOGASTRODUODENOSCOPY (EGD) WITH PROPOFOL;  Surgeon: Rush Landmark Telford Nab., MD;  Location: [facility];  Service: Gastroenterology;  Laterality: N/A;   ESOPHAGOGASTRODUODENOSCOPY (EGD) WITH PROPOFOL N/A 06/03/2020   Procedure: ESOPHAGOGASTRODUODENOSCOPY (EGD) WITH PROPOFOL;  Surgeon: Rush Landmark Telford Nab., MD;  Location: [facility]OS[facility];  Service: Gastroenterology;  Laterality: N/A;   EUS  04/01/2018   Procedure: FULL UPPER ENDOSCOPIC ULTRASOUND (EUS) RADIAL;  Surgeon: Irving Copas., MD;  Location: [place];  Service: Gastroenterology;;   EUS N/A 09/12/2018   Procedure: UPPER ENDOSCOPIC ULTRASOUND (EUS) RADIAL;   Surgeon: Irving Copas., MD;  Location: [place];  Service: Gastroenterology;  Laterality: N/A;   FINE NEEDLE ASPIRATION  09/12/2018   Procedure: FINE NEEDLE ASPIRATION (FNA) LINEAR;  Surgeon: Irving Copas., MD;  Location: [place];  Service: Gastroenterology;;   HAND SURGERY Left 2018   saw accident   HEMOSTASIS CLIP PLACEMENT  09/12/2018   Procedure: HEMOSTASIS CLIP PLACEMENT;  Surgeon: Irving Copas., MD;  Location: [place];  Service: Gastroenterology;;   HEMOSTASIS CLIP PLACEMENT  06/03/2020   Procedure: HEMOSTASIS CLIP PLACEMENT;  Surgeon: Irving Copas., MD;  Location: [facility];  Service: Gastroenterology;;   I & D EXTREMITY Left 11/24/2016   Procedure: IRRIGATION AND DEBRIDEMENT LEFT HAND, THUMB, INDEX, MIDDLE, RING, AND SMALL FINGERS WITH RECONSTRUCTION;  Surgeon: Roseanne Kaufman, MD;  Location: [place];  Service: Orthopedics;  Laterality: Left;   KNEE ARTHROSCOPY Left    LEFT HEART CATH AND CORS/GRAFTS ANGIOGRAPHY N/A 01/05/2019   Procedure: LEFT HEART CATH AND CORS/GRAFTS ANGIOGRAPHY;  Surgeon: Nelva Bush, MD;  Location: [place] [facility];  Service: Cardiovascular;  Laterality: N/A;   LUMBAR LAMINECTOMY     and fusion x 2   NASAL SINUS SURGERY     POPLITEAL SYNOVIAL CYST EXCISION     REMOVAL OF STONES  04/01/2018   Procedure: REMOVAL OF STONES;  Surgeon: Rush Landmark Telford Nab., MD;  Location: [place];  Service: Gastroenterology;;   REMOVAL OF STONES  09/12/2018   Procedure: REMOVAL OF STONES;  Surgeon: Irving Copas., MD;  Location: [place];  Service: Gastroenterology;;   REMOVAL OF  STONES  11/28/2018   Procedure: REMOVAL OF STONES;  Surgeon: Rush Landmark Telford Nab., MD;  Location: [place];  Service: Gastroenterology;;   REMOVAL OF STONES  03/11/2020   Procedure: REMOVAL OF STONES;  Surgeon: Irving Copas., MD;  Location: [facility];  Service: Gastroenterology;;   REMOVAL OF STONES  06/03/2020    Procedure: REMOVAL OF STONES;  Surgeon: Irving Copas., MD;  Location: [facility];  Service: Gastroenterology;;   Azzie Almas DILATION N/A 09/12/2018   Procedure: Renato Battles;  Surgeon: Irving Copas., MD;  Location: [place];  Service: Gastroenterology;  Laterality: N/A;   SAVORY DILATION N/A 11/28/2018   Procedure: SAVORY DILATION;  Surgeon: Rush Landmark Telford Nab., MD;  Location: [place];  Service: Gastroenterology;  Laterality: N/A;   SEPTOPLASTY Bilateral 05/26/2021   Procedure: SEPTOPLASTY;  Surgeon: Leta Baptist, MD;  Location: [place];  Service: ENT;  Laterality: Bilateral;   SPHINCTEROTOMY  04/01/2018   Procedure: Joan Mayans;  Surgeon: Mansouraty, Telford Nab., MD;  Location: [place];  Service: Gastroenterology;;   Bess Kinds CHOLANGIOSCOPY N/A 11/28/2018   Procedure: [facility] CHOLANGIOSCOPY;  Surgeon: Irving Copas., MD;  Location: [place];  Service: Gastroenterology;  Laterality: N/A;   SPYGLASS CHOLANGIOSCOPY N/A 06/03/2020   Procedure: SPYGLASS CHOLANGIOSCOPY;  Surgeon: Irving Copas., MD;  Location: [facility];  Service: Gastroenterology;  Laterality: N/A;   STENT REMOVAL  09/12/2018   Procedure: STENT REMOVAL;  Surgeon: Irving Copas., MD;  Location: [place];  Service: Gastroenterology;;   Lavell Islam REMOVAL  11/28/2018   Procedure: STENT REMOVAL;  Surgeon: Irving Copas., MD;  Location: [place];  Service: Gastroenterology;;   Lavell Islam REMOVAL  03/11/2020   Procedure: STENT REMOVAL;  Surgeon: Irving Copas., MD;  Location: [facility];  Service: Gastroenterology;;   SUBMUCOSAL LIFTING INJECTION  09/12/2018   Procedure: SUBMUCOSAL LIFTING INJECTION;  Surgeon: Irving Copas., MD;  Location: [place];  Service: Gastroenterology;;   Current Outpatient Medications on File Prior to Visit  Medication Sig Dispense Refill   acetaminophen (TYLENOL) 500 MG tablet Take 1,000 mg by mouth  every 6 (six) hours as needed for moderate pain.     amLODipine (NORVASC) 2.5 MG tablet Take 2 tablets (5 mg total) by mouth daily. KEEP UPCOMING APPOINTMENT FOR FUTURE REFILLS. 180 tablet 0   cetirizine (ZYRTEC) 10 MG tablet Take 10 mg by mouth daily as needed for allergies.     clopidogrel (PLAVIX) 75 MG tablet TAKE 1 TABLET BY MOUTH ONCE  DAILY 60 tablet 0   Coenzyme Q10 300 MG CAPS Take 300 mg by mouth every evening.     cyanocobalamin (VITAMIN B12) 1000 MCG/ML injection INJECT 1 ML  SUBCUTANEOUSLY ONCE EVERY MONTH 3 mL 0   dextromethorphan-guaiFENesin (MUCINEX DM) 30-600 MG 12hr tablet Take 1 tablet by mouth 2 (two) times daily.     ezetimibe (ZETIA) 10 MG tablet TAKE 1 TABLET BY MOUTH DAILY 100 tablet 1   Ferrous Sulfate (IRON) 28 MG TABS Take 28 mg by mouth daily.     furosemide (LASIX) 40 MG tablet Take 1 tablet (40 mg total) by mouth every other day. KEEP UPCOMING APPOINTMENT FOR FUTURE REFILLS. 15 tablet 0   guaifenesin (HUMIBID E) 400 MG TABS tablet Take 400 mg by mouth daily.     hyoscyamine (LEVSIN SL) 0.125 MG SL tablet Place 1 tablet (0.125 mg total) under the tongue every 4 (four) hours as needed. (Patient taking differently: Place 0.125 mg under the tongue every 4 (four) hours as needed (  spasms).) 30 tablet 2   isosorbide mononitrate (IMDUR) 60 MG 24 hr tablet Take 1 tablet (60 mg total) by mouth daily. KEEP UPCOMING APPOINTMENT FOR FUTURE REFILLS. 90 tablet 0   Menthol, Topical Analgesic, (BENGAY EX) Apply 1 application topically daily as needed (pain).     METAMUCIL FIBER PO Take 3 capsules by mouth daily.     Multiple Vitamins-Minerals (MULTIVITAMIN WITH MINERALS) tablet Take 1 tablet by mouth daily.     Naphazoline-Pheniramine (OPCON-A) 0.027-0.315 % SOLN Place 1 drop into both eyes daily as needed (itching eyes).     nebivolol (BYSTOLIC) 10 MG tablet Take 1 tablet (10 mg total) by mouth daily. Please schedule appointment for additional refills. 100 tablet 1   NEEDLE, DISP, 25  G (B-D DISP NEEDLE 25GX1") 25G X 1" MISC Inject 1000 mcg into muscle once a month. 50 each 0   nitroGLYCERIN (NITROSTAT) 0.4 MG SL tablet PLACE 1 TABLET UNDER THE TONGUE EVERY 5 MINUTES AS NEEDED FOR CHEST PAIN 25 tablet 2   oxyCODONE-acetaminophen (PERCOCET) 10-325 MG tablet Take 1 tablet by mouth 2 (two) times daily as needed for pain. 60 tablet 0   pantoprazole (PROTONIX) 40 MG tablet Take 1 tablet by mouth once daily 90 tablet 0   Probiotic Product (PROBIOTIC ADVANCED PO) Take 1 tablet by mouth every evening.     rOPINIRole (REQUIP) 2 MG tablet TAKE 1 TABLET BY MOUTH AT  BEDTIME 100 tablet 2   rosuvastatin (CRESTOR) 10 MG tablet TAKE 1 TABLET BY MOUTH DAILY 60 tablet 5   venlafaxine (EFFEXOR) 75 MG tablet Take 1 tablet (75 mg total) by mouth daily. 90 tablet 1   No current facility-administered medications on file prior to visit.   No Known Allergies  Current Medications, Allergies, Past Medical History, Past Surgical History, Family History and Social History were reviewed in [employer] record.  Review of Systems:   Constitutional: Negative for fever, sweats, chills or weight loss.  Respiratory: Negative for shortness of breath.   Cardiovascular: Negative for chest pain, palpitations and leg swelling.  Gastrointestinal: See HPI.  Musculoskeletal: Negative for back pain or muscle aches.  Neurological: Negative for dizziness, headaches or paresthesias.   Physical Exam: BP (!) 140/62   Pulse 62   Ht '5\' 9"'$  (1.753 m)   Wt 177 lb (80.3 kg)   BMI 26.14 kg/m   Wt Readings from Last 3 Encounters:  12/17/21 177 lb (80.3 kg)  12/04/21 175 lb (79.4 kg)  11/19/21 177 lb 9.6 oz (80.6 kg)    General: 81 year old male in NAD.  Head: Normocephalic and atraumatic. Eyes: No scleral icterus. Conjunctiva pink . Ears: Normal auditory acuity. Mouth: Dentition intact. No ulcers or lesions.  Lungs: Clear throughout to auscultation. Heart: Regular rate and rhythm, no  murmur. Abdomen: Soft, nontender and nondistended. No masses or hepatomegaly. Normal bowel sounds x 4 quadrants.  Rectal: No external hemorrhoids or fissures. Enlarged prostate when examined in the left lateral position. No blood or stool in the rectal vault. Coccyx nontender. Anal pain was not reproduced on exam. Raquel Sarna CMA present during exam.  Musculoskeletal: Symmetrical with no gross deformities. Extremities: No edema. Neurological: Alert oriented x 4. No focal deficits.  Psychological: Alert and cooperative. Normal mood and affect  Assessment and Recommendations:  13) 81 year old male with chronic anal pain x 10 years. Colonoscopy 12/2017 findings did not explain his anal pain.  -Patient instructed to follow up with PCP, to consider lumbar/sacral MRI -Consider  flex sig at vs repeat colonoscopy, to discuss further with Dr. Rush Landmark  -CBC -Follow up with urologist   2) Dysphagia, recurrent  -EGD with possible esophageal dilatation  -Our office will contact cardiologist Dr. Fredric Dine to verify Plavix hold instructions prior to EGD  3) History of GERD, Barrett's esophagus  -Continue Pantoprazole 40 mg once daily -See plan in # 2  4) Bilateral groin pain x 2 years, no obvious hernia on exam -Follow up with PCP, consider CTAP  5) History of a small tubular adenomatous polyp removed from the colon per colonoscopy 12/2017      6) History of recurrent choledocholithiasis, biliary stricture  with biliary stent placement with subsequent follow ERCP with stent removal  -CMP  7) History of CAD s/p 2 vessel CABG 2001, s/p DES 12//2020 on Plavix  ADDENDUM: See phone note.  Patient elected to proceed with a flexible sigmoidoscopy at the time of his EGD.  Patient was instructed previously to hold Plavix 5 days prior to his procedure date as verified by Laurann Montana cardiology NP on 12/17/2021.

## 2021-12-17 ENCOUNTER — Ambulatory Visit (INDEPENDENT_AMBULATORY_CARE_PROVIDER_SITE_OTHER): Payer: Medicare Other | Admitting: Nurse Practitioner

## 2021-12-17 ENCOUNTER — Telehealth: Payer: Self-pay

## 2021-12-17 ENCOUNTER — Other Ambulatory Visit (INDEPENDENT_AMBULATORY_CARE_PROVIDER_SITE_OTHER): Payer: Medicare Other

## 2021-12-17 ENCOUNTER — Encounter: Payer: Self-pay | Admitting: Nurse Practitioner

## 2021-12-17 VITALS — BP 140/62 | HR 62 | Ht 69.0 in | Wt 177.0 lb

## 2021-12-17 DIAGNOSIS — K6289 Other specified diseases of anus and rectum: Secondary | ICD-10-CM | POA: Diagnosis not present

## 2021-12-17 DIAGNOSIS — R1319 Other dysphagia: Secondary | ICD-10-CM

## 2021-12-17 DIAGNOSIS — K219 Gastro-esophageal reflux disease without esophagitis: Secondary | ICD-10-CM

## 2021-12-17 LAB — COMPREHENSIVE METABOLIC PANEL
ALT: 6 U/L (ref 0–53)
AST: 15 U/L (ref 0–37)
Albumin: 4.2 g/dL (ref 3.5–5.2)
Alkaline Phosphatase: 80 U/L (ref 39–117)
BUN: 23 mg/dL (ref 6–23)
CO2: 32 mEq/L (ref 19–32)
Calcium: 9 mg/dL (ref 8.4–10.5)
Chloride: 104 mEq/L (ref 96–112)
Creatinine, Ser: 1.46 mg/dL (ref 0.40–1.50)
GFR: 45.43 mL/min — ABNORMAL LOW (ref >60.00)
Glucose, Bld: 101 mg/dL — ABNORMAL HIGH (ref 70–99)
Potassium: 4.6 mEq/L (ref 3.5–5.1)
Sodium: 141 mEq/L (ref 135–145)
Total Bilirubin: 0.4 mg/dL (ref 0.2–1.2)
Total Protein: 7.6 g/dL (ref 6.0–8.3)

## 2021-12-17 LAB — CBC WITH DIFFERENTIAL/PLATELET
Basophils Absolute: 0.1 10*3/uL (ref 0.0–0.1)
Basophils Relative: 0.9 % (ref 0.0–3.0)
Eosinophils Absolute: 0.4 10*3/uL (ref 0.0–0.7)
Eosinophils Relative: 4.9 % (ref 0.0–5.0)
HCT: 38 % — ABNORMAL LOW (ref 39.0–52.0)
Hemoglobin: 13.2 g/dL (ref 13.0–17.0)
Lymphocytes Relative: 21.3 % (ref 12.0–46.0)
Lymphs Abs: 1.9 10*3/uL (ref 0.7–4.0)
MCHC: 34.7 g/dL (ref 30.0–36.0)
MCV: 86.1 fl (ref 78.0–100.0)
Monocytes Absolute: 0.7 10*3/uL (ref 0.1–1.0)
Monocytes Relative: 8.2 % (ref 3.0–12.0)
Neutro Abs: 5.7 10*3/uL (ref 1.4–7.7)
Neutrophils Relative %: 64.7 % (ref 43.0–77.0)
Platelets: 193 10*3/uL (ref 150.0–400.0)
RBC: 4.41 Mil/uL (ref 4.22–5.81)
RDW: 14.3 % (ref 11.5–15.5)
WBC: 8.8 10*3/uL (ref 4.0–10.5)

## 2021-12-17 NOTE — Telephone Encounter (Signed)
[facility] Pre-operative Risk Assessment     Request for surgical clearance:     Endoscopy Procedure  What type of surgery is being performed?     Endoscopy  When is this surgery scheduled?     02/10/22  What type of clearance is required ?   Pharmacy  Are there any medications that need to be held prior to surgery and how long? Plavix & 5 days  Practice name and name of physician performing surgery?      [facility]  What is your office phone and fax number?      Phone- [phone]  [phone]  Anesthesia type (None, local, MAC, general) ?       MAC

## 2021-12-17 NOTE — Patient Instructions (Addendum)
You have been scheduled for an endoscopy. Please follow written instructions given to you at your visit today. If you use inhalers (even only as needed), please bring them with you on the day of your procedure.  Your provider has requested that you go to the basement level for lab work before leaving today. Press "B" on the elevator. The lab is located at the first door on the left as you exit the elevator.   Follow up with your urologist.  Due to recent changes in healthcare laws, you may see the results of your imaging and laboratory studies on MyChart before your provider has had a chance to review them.  We understand that in some cases there may be results that are confusing or concerning to you. Not all laboratory results come back in the same time frame and the provider may be waiting for multiple results in order to interpret others.  Please give [place] 48 hours in order for your provider to thoroughly review all the results before contacting the office for clarification of your results.    Thank you for trusting me with your gastrointestinal care!   Carl Best, CRNP

## 2021-12-17 NOTE — Telephone Encounter (Signed)
Called pt and informed patient that his cardiologist said it was ok to hold Plavix 5 days prior to procedure (02/05/21)

## 2021-12-17 NOTE — Telephone Encounter (Signed)
Called patient and was unable to leave a voicemail to call back regarding Plavix hold.

## 2021-12-17 NOTE — Telephone Encounter (Signed)
   Patient Name: [name]  DOB: [birth year] MRN: [id number]  Primary Cardiologist: [name], MD  Chart reviewed as part of pre-operative protocol coverage.Pharmacy clearance only. Per office protocols and previous recommendations, [name] may hold Plavix 5 days prior to planned procedure.   I will route this recommendation to the requesting party via Epic fax function and remove from pre-op pool.  Please call with questions.  Loel Dubonnet, NP 12/17/2021, 10:05 AM

## 2021-12-22 ENCOUNTER — Ambulatory Visit: Payer: Medicare Other | Admitting: Cardiovascular Disease

## 2021-12-22 NOTE — Progress Notes (Signed)
Attending Physician's Attestation   I have reviewed the chart.   I agree with the Advanced Practitioner's note, impression, and recommendations with any updates as below. Endoscopic reevaluation makes sense in regards to dysphagia symptoms.  Regards to chronic rectal/anal pain, may end up being proctalgia, but can consider flexible sigmoidoscopy at same time as EGD to rule out proctitis.  If patient agrees this can be added at the same time.   Justice Britain, MD [facility] Advanced Endoscopy Office # [phone]

## 2021-12-25 NOTE — Progress Notes (Signed)
[name], refer to Dr. Donneta Romberg addendum. Please contact patient and if he wishes to purse a flex sig at the time of his EGD then please add the flex sig to EGD date and provide patient with flex sig prep. Please make sure we receive Plavix hold instructions prior to procedure date. Pls let me know if patient wants to have flex sig.  THX

## 2021-12-26 ENCOUNTER — Telehealth: Payer: Self-pay

## 2021-12-26 NOTE — Telephone Encounter (Signed)
Routed Note  Author: Noralyn Pick, NP Service: Gastroenterology Author Type: Nurse Practitioner  Filed: 12/25/2021  6:03 AM Encounter Date: 12/17/2021 Status: Signed  Editor: Noralyn Pick, NP (Nurse Practitioner)   Remo Lipps, refer to Dr. Donneta Romberg addendum. Please contact patient and if he wishes to purse a flex sig at the time of his EGD then please add the flex sig to EGD date and provide patient with flex sig prep. Please make sure we receive Plavix hold instructions prior to procedure date. Pls let me know if patient wants to have flex sig.  THX

## 2021-12-26 NOTE — Telephone Encounter (Signed)
Pt was made aware of Carl Best NP and Dr. Rush Landmark recommendations: Pt aggred to add on Flex Sig to the EGD Pt was scheduled for the Flex Sig/EGD 02/13/2022 at 3:30 PM: Pt made aware  Prep instructions sent to pt via my chart: Pt made aware: Message sent to Pre Cert team to make aware of update: Chart reviewed. Pt previously has been given clearance to hold Plavix for 5 days and pt aware  Pt verbalized understanding with all questions answered.

## 2021-12-29 ENCOUNTER — Telehealth: Payer: Self-pay | Admitting: Pharmacist

## 2021-12-29 NOTE — Chronic Care Management (AMB) (Signed)
    Chronic Care Management Pharmacy Assistant   Name: [name]  MRN: [id number] DOB: 01/30/1940  12/30/2021 APPOINTMENT REMINDER  [name] was reminded to have all medications, supplements and any blood glucose and blood pressure readings available for review with Jeni Salles, Pharm. D, at his telephone visit on 12/30/2021 at 11:00.  Care Gaps: AWV - 10/07/2021 message sent to [name]  Last BP - 140/62 on 12/17/2021 Last A1C - 5.9 on 12/04/2021 AWV - never done Foot exam - never done Hep C Screen - never done Shingrix - never done Eye exam - postponed  Star Rating Drug: Rosuvastatin 10 mg - last filled 12/12/2021 100 DS at [facility]  Any gaps in medications fill history? No  Gennie Alma [facility]  Catering manager [phone]

## 2021-12-30 ENCOUNTER — Ambulatory Visit (INDEPENDENT_AMBULATORY_CARE_PROVIDER_SITE_OTHER): Payer: Medicare Other | Admitting: Pharmacist

## 2021-12-30 DIAGNOSIS — I10 Essential (primary) hypertension: Secondary | ICD-10-CM

## 2021-12-30 DIAGNOSIS — E119 Type 2 diabetes mellitus without complications: Secondary | ICD-10-CM

## 2021-12-30 NOTE — Progress Notes (Signed)
Chronic Care Management Pharmacy Note  12/30/2021 Name:  [name] MRN:  [id number] DOB:  [birth year]  Summary: BP is not at goal < 140/90 per home and office readings Pt is having more sinus headaches than usual  Recommendations/Changes made from today's visit: -Recommended bringing home BP cuff to office for next visit -Recommended Mucinex 1200 mg BID and stopping Mucinex DM -Recommended reaching out to ENT if congestion does not improve  Plan: BP assessment in 1 month Follow up in 4 months  Subjective: [name] is an 81 y.o. year old male who is a primary patient of Isaac Bliss, Rayford Halsted, MD.  The CCM team was consulted for assistance with disease management and care coordination needs.    Engaged with patient by telephone for follow up visit in response to provider referral for pharmacy case management and/or care coordination services.   Consent to Services:  The patient was given information about Chronic Care Management services, agreed to services, and gave verbal consent prior to initiation of services.  Please see initial visit note for detailed documentation.   Patient Care Team: Isaac Bliss, Rayford Halsted, MD as PCP - General (Internal Medicine) Troy Sine, MD as PCP - Cardiology (Cardiology) Viona Gilmore, [facility] as Pharmacist (Pharmacist)  Recent office visits: 12/04/21 Domingo Mend, MD: Patient presented for DM follow up and pain management. Refill oxycodone/APAP x 3 months. Follow up in 3 months for elevated BP and pain management refill.  10/16/21 Domingo Mend, MD: Patient presented for back pain.  Referred to GI for possible repeat colonoscopy.   09/04/2021 [name] MD - Patient was seen for Type 2 diabetes mellitus with other specified complication, without long-term current use of insulin and additional concerns. No medication changes. No follow up noted.    Recent consult visits: 12/17/21 Carl Best, NP (gastro): Patient presented for rectal pain and dysphagia. Plan for EGD.  11/20/21 [name], MD (cardiology): Patient presented for CAD follow up. Plan for repeat Echo in 6 months.  08/11/21 Irine Seal, MD (urology): Patient presented for follow up for poor urinary stream. Unable to access notes.  Hospital visits: Medication Reconciliation was completed by comparing discharge summary, patient's EMR and Pharmacy list, and upon discussion with patient.   Patient visited [facility] on 05/26/21 for 4 hours for septoplasty procedure.   New?Medications Started at [facility] Discharge:?? -started None.   Medication Changes at Hospital Discharge: -Changed none   Medications Discontinued at Hospital Discharge: -Stopped None.    Medications that remain the same after Hospital Discharge:??  -All other medications will remain the same.     Objective:  Lab Results  Component Value Date   CREATININE 1.46 12/17/2021   BUN 23 12/17/2021   GFR 45.43 (L) 12/17/2021   GFRNONAA 48 (L) 05/20/2021   GFRAA 47 (L) 11/13/2019   NA 141 12/17/2021   K 4.6 12/17/2021   CALCIUM 9.0 12/17/2021   CO2 32 12/17/2021   GLUCOSE 101 (H) 12/17/2021    Lab Results  Component Value Date/Time   HGBA1C 5.9 (A) 12/04/2021 09:44 AM   HGBA1C 6.1 (A) 09/04/2021 09:29 AM   HGBA1C 7.0 (H) 03/31/2018 01:09 AM   GFR 45.43 (L) 12/17/2021 10:03 AM   GFR 41.03 (L) 07/27/2018 04:18 PM   MICROALBUR 15.6 (H) 06/02/2021 10:33 AM    Last diabetic Eye exam: No results found for: "HMDIABEYEEXA"  Last diabetic Foot exam: No results found for: "HMDIABFOOTEX"   Lab Results  Component Value Date   CHOL 118 12/09/2020   HDL 36 (L) 12/09/2020   LDLCALC 62 12/09/2020   LDLDIRECT 90.3 04/17/2008   TRIG 104 12/09/2020   CHOLHDL 3.3 12/09/2020       Latest Ref Rng & Units 12/17/2021   10:03 AM 12/09/2020    8:53 AM 03/22/2020   12:47 PM  Hepatic Function  Total Protein 6.0 - 8.3 g/dL 7.6   7.2  7.1   Albumin 3.5 - 5.2 g/dL 4.2  4.2  3.7   AST 0 - 37 U/L _0 ALT 0 - 53 U/L _1 Alk Phosphatase 39 - 117 U/L 80  83  67   Total Bilirubin 0.2 - 1.2 mg/dL 0.4  0.4  0.4   Bilirubin, Direct 0.0 - 0.3 mg/dL   0.1     Lab Results  Component Value Date/Time   TSH 3.720 12/09/2020 08:53 AM   TSH 3.040 07/20/2019 08:50 AM       Latest Ref Rng & Units 12/17/2021   10:03 AM 12/09/2020    8:53 AM 11/09/2019    9:39 AM  CBC  WBC 4.0 - 10.5 K/uL 8.8  7.8  7.2   Hemoglobin 13.0 - 17.0 g/dL 13.2  13.7  9.7   Hematocrit 39.0 - 52.0 % 38.0  40.5  31.0   Platelets 150.0 - 400.0 K/uL 193.0  161  172     Lab Results  Component Value Date/Time   VD25OH 28.9 (L) 07/20/2019 08:50 AM    Clinical ASCVD: Yes  The ASCVD Risk score (Arnett DK, et al., 2019) failed to calculate for the following reasons:   The patient has a prior MI or stroke diagnosis       12/04/2021    9:39 AM 10/16/2021   11:26 AM 09/04/2021    9:30 AM  Depression screen PHQ 2/9  Decreased Interest _2 Down, Depressed, Hopeless _3 PHQ - 2 Score _4 Altered sleeping 2 1 0  Tired, decreased energy _5 Change in appetite _6 Feeling bad or failure about yourself  _7 Trouble concentrating _8 Moving slowly or fidgety/restless 0 1 1  Suicidal thoughts 0 0 0  PHQ-9 Score _9 Difficult doing work/chores Extremely dIfficult Extremely dIfficult Extremely dIfficult     Social History   Tobacco Use  Smoking Status Former   Packs/day: 0.50   Years: 5.00   Total pack years: 2.50   Types: Cigarettes   Quit date: 03/30/1976   Years since quitting: 45.7  Smokeless Tobacco Never   BP Readings from Last 3 Encounters:  12/17/21 (!) 140/62  12/04/21 (!) 149/70  11/19/21 (!) 140/62   Pulse Readings from Last 3 Encounters:  12/17/21 62  12/04/21 64  11/19/21 (!) 59   Wt Readings from Last 3 Encounters:  12/17/21 177 lb (80.3 kg)  12/04/21 175 lb (79.4 kg)  11/19/21  177 lb 9.6 oz (80.6 kg)   BMI Readings from Last 3 Encounters:  12/17/21 26.14 kg/m  12/04/21 25.84 kg/m  11/19/21 26.23 kg/m    Assessment/Interventions: Review of patient past medical history, allergies, medications, health status, including review of consultants reports, laboratory and other test data, was performed as part of comprehensive evaluation and provision of chronic care management services.   SDOH:  (Social Determinants of Health) assessments  and interventions performed: Yes  SDOH Interventions    Flowsheet Row Chronic Care Management from 12/30/2021 in [place] at [place] from 03/05/2021 in [place] at [place] Management from 07/04/2019 in [place] at [place]  SDOH Interventions     Transportation Interventions -- -- Intervention Not Indicated  Depression Interventions/Treatment  -- Currently on Treatment --  Financial Strain Interventions Intervention Not Indicated -- Intervention Not Indicated      SDOH Screenings   Food Insecurity: No Food Insecurity (03/03/2021)  Housing: Low Risk  (03/03/2021)  Transportation Needs: No Transportation Needs (03/03/2021)  Depression (PHQ2-9): High Risk (12/04/2021)  Financial Resource Strain: Low Risk  (12/30/2021)  Physical Activity: Unknown (03/03/2021)  Social Connections: Moderately Integrated (03/03/2021)  Stress: Stress Concern Present (03/03/2021)  Tobacco Use: Medium Risk (12/17/2021)    CCM Care Plan  No Known Allergies   Medications Reviewed Today     Reviewed by Viona Gilmore, [facility] (Pharmacist) on 12/30/21 at 1152  Med List Status: <None>   Medication Order Taking? Sig Documenting Provider Last Dose Status Informant  acetaminophen (TYLENOL) 500 MG tablet [id number]  Take 1,000 mg by mouth every 6 (six) hours as needed for moderate pain. [provider]  Active Family Member  amLODipine (NORVASC) 2.5 MG tablet [id number]  Take 2 tablets (5 mg  total) by mouth daily. KEEP UPCOMING APPOINTMENT FOR FUTURE REFILLS. Troy Sine, MD  Active   cetirizine (ZYRTEC) 10 MG tablet [id number] Yes Take 10 mg by mouth daily as needed for allergies. [provider] Taking Active Family Member  clopidogrel (PLAVIX) 75 MG tablet [id number]  TAKE 1 TABLET BY MOUTH ONCE  DAILY Troy Sine, MD  Active   Coenzyme Q10 300 MG CAPS [id number]  Take 300 mg by mouth every evening. [provider]  Active Family Member  cyanocobalamin (VITAMIN B12) 1000 MCG/ML injection [id number]  INJECT 1 ML  SUBCUTANEOUSLY ONCE EVERY MONTH Isaac Bliss, Rayford Halsted, MD  Active   ezetimibe (ZETIA) 10 MG tablet [id number]  TAKE 1 TABLET BY MOUTH DAILY Isaac Bliss, Rayford Halsted, MD  Active   Ferrous Sulfate (IRON) 28 MG TABS [id number]  Take 28 mg by mouth daily. [provider]  Active Family Member  fluticasone (FLONASE) 50 MCG/ACT nasal spray [id number] Yes Place 1 spray into both nostrils daily. [provider] Taking Active   furosemide (LASIX) 40 MG tablet [id number]  Take 1 tablet (40 mg total) by mouth every other day. KEEP UPCOMING APPOINTMENT FOR FUTURE REFILLS. Troy Sine, MD  Active   hyoscyamine (LEVSIN SL) 0.125 MG SL tablet [id number]  Place 1 tablet (0.125 mg total) under the tongue every 4 (four) hours as needed.  Patient taking differently: Place 0.125 mg under the tongue every 4 (four) hours as needed (spasms).   Mansouraty, Telford Nab., MD  Active   isosorbide mononitrate (IMDUR) 60 MG 24 hr tablet [id number]  Take 1 tablet (60 mg total) by mouth daily. KEEP UPCOMING APPOINTMENT FOR FUTURE REFILLS. Troy Sine, MD  Active   Menthol, Topical Analgesic, [facility] EX) [id number]  Apply 1 application topically daily as needed (pain). [provider]  Active Family Member  METAMUCIL FIBER PO [id number]  Take 3 capsules by mouth daily. [provider]  Active Family Member  Multiple Vitamins-Minerals  (MULTIVITAMIN WITH MINERALS) tablet [id number]  Take 1 tablet by mouth daily. [provider]  Active Family Member  Naphazoline-Pheniramine (OPCON-A) 0.027-0.315 % SOLN [id number]  Place 1 drop  into both eyes daily as needed (itching eyes). [provider]  Active Family Member  nebivolol (BYSTOLIC) 10 MG tablet [id number]  Take 1 tablet (10 mg total) by mouth daily. Please schedule appointment for additional refills. Troy Sine, MD  Active   NEEDLE, DISP, 25 G (B-D DISP NEEDLE 25GX1") 25G X 1" MISC [id number]  Inject 1000 mcg into muscle once a month. Isaac Bliss, Rayford Halsted, MD  Active Family Member  nitroGLYCERIN (NITROSTAT) 0.4 MG SL tablet [id number]  PLACE 1 TABLET UNDER THE TONGUE EVERY 5 MINUTES AS NEEDED FOR CHEST PAIN Isaac Bliss, Rayford Halsted, MD  Active   oxyCODONE-acetaminophen (PERCOCET) 10-325 MG tablet [id number]  Take 1 tablet by mouth 2 (two) times daily as needed for pain. Isaac Bliss, Rayford Halsted, MD  Active   pantoprazole (PROTONIX) 40 MG tablet [id number]  Take 1 tablet by mouth once daily Mansouraty, Telford Nab., MD  Active   Probiotic Product (PROBIOTIC ADVANCED PO) [id number]  Take 1 tablet by mouth every evening. [provider]  Active Family Member  rOPINIRole (REQUIP) 2 MG tablet [id number]  TAKE 1 TABLET BY MOUTH AT  BEDTIME Isaac Bliss, Rayford Halsted, MD  Active   rosuvastatin (CRESTOR) 10 MG tablet [id number]  TAKE 1 TABLET BY MOUTH DAILY Troy Sine, MD  Active   venlafaxine [facility]) 75 MG tablet [id number]  Take 1 tablet (75 mg total) by mouth daily. Isaac Bliss, Rayford Halsted, MD  Active             Patient Active Problem List   Diagnosis Date Noted   Bloating 04/27/2020   Gas pain 04/27/2020   Abdominal cramping 04/27/2020   Functional abdominal pain syndrome 11/12/2019   Lower extremity edema 11/12/2019   Hyperlipidemia    History of cholecystectomy 01/19/2019   Chronic diarrhea 01/19/2019   Antiplatelet or  antithrombotic long-term use 01/19/2019   Non-ST elevation (NSTEMI) myocardial infarction [facility])    Acute hypoxemic respiratory failure ([place]) 01/04/2019   Multifocal pneumonia 01/04/2019   Severe sepsis ([place]) 01/04/2019   Elevated troponin 01/04/2019   Acute on chronic diastolic (congestive) heart failure ([place]) 01/04/2019   Acute respiratory failure ([place]) 01/04/2019   elevated IgG4 11/01/2018   Choledocholithiasis 10/27/2018   Dilation of biliary tract 10/27/2018   Esophageal dysphagia 10/27/2018   Upper airway cough syndrome 07/28/2018   Pleural effusion on left 07/28/2018   History of pancreatitis 05/12/2018   Abnormal LFTs 05/12/2018   Abnormal findings on esophagogastroduodenoscopy (EGD) 05/12/2018   History of ERCP 05/12/2018   Anemia 05/12/2018   Biliary stricture    ESBL (extended spectrum beta-lactamase) producing bacteria infection 04/04/2018   Ascending cholangitis 04/01/2018   Diabetes mellitus type 2, noninsulin dependent ([place]) 03/31/2018   Pancreatitis 03/31/2018   Sepsis ([place]) 03/31/2018   Bacteremia due to Gram-negative bacteria 03/31/2018   Abdominal pain    Dilated bile duct    Acute biliary pancreatitis 03/30/2018   Leukocytosis 03/30/2018   CAD (coronary artery disease) 03/30/2018   Osteoarthritis of left knee 10/18/2017   Cervical radiculopathy 10/04/2017   Pain of left hand 07/19/2017   Pre-operative cardiovascular examination 06/16/2017   CKD (chronic kidney disease), stage III 06/16/2017   Carpal tunnel syndrome 05/27/2017   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 05/10/2017   Open fracture of base of middle phalanx of finger 02/17/2017   Laceration of index finger 02/17/2017   Laceration of nail bed of finger 02/17/2017   Laceration of thumb 02/17/2017   Open fracture of distal phalanx  of finger 02/17/2017   Open fractures of multiple sites of phalanx of left hand 11/24/2016   B12 deficiency 05/28/2015   GERD (gastroesophageal reflux disease)  11/16/2014   Hyperlipidemia LDL goal <70 11/21/2013   DOE (dyspnea on exertion) 11/21/2013   Other dysphagia 07/14/2013   History of esophageal stricture 07/14/2013   Hx of CABG 06/05/2013   Leg pain, bilateral 10/18/2012   Restless leg syndrome 06/21/2012   ULNAR NEUROPATHY, LEFT 08/05/2009   Urinary obstruction 10/19/2008   ACTINIC KERATOSIS 06/25/2008   DRY EYE SYNDROME 10/13/2007   CARCINOMA, BLADDER, HX OF 07/02/2007   BARRETTS ESOPHAGUS 05/23/2007   Hypothyroidism 04/08/2007   Other malaise and fatigue 04/08/2007   Essential hypertension 10/12/2006   ANEMIA, B12 DEFICIENCY 09/08/2006   Depression 07/29/2006   NEUROPATHY, IDIOPATHIC PERIPHERAL NEC 07/29/2006   Allergic rhinitis 07/29/2006   LOW BACK PAIN 07/29/2006   COLONIC POLYPS 11/04/2000    Immunization History  Administered Date(s) Administered   COVID-19, mRNA, vaccine(Comirnaty)12 years and older 11/19/2021   Fluad Quad(high Dose 65+) 11/02/2018, 11/09/2019, 10/16/2021   Influenza Split 10/01/2010, 10/16/2011   Influenza Whole 01/20/2004, 11/24/2006, 10/19/2008, 10/22/2009   Influenza, High Dose Seasonal PF 10/19/2013, 10/22/2014, 09/30/2015, 10/26/2016, 11/19/2017, 11/01/2020   Influenza,inj,Quad PF,6+ Mos 10/18/2012   Influenza,inj,quad, With Preservative 10/19/2016   Moderna Sars-Covid-2 Vaccination 03/12/2019, 04/03/2019, 12/29/2019, 10/22/2020   Pfizer Covid-19 Vaccine Bivalent Booster 72yr & up 10/22/2020   Pneumococcal Conjugate-13 05/29/2013   Pneumococcal Polysaccharide-23 10/22/2009   Tdap 11/24/2016   Tetanus 05/29/2013    Patient reports his head is not in great shape and has been taking sinus pills and Mucinex for the last 2-3 years and he only takes it every few days. He was taking Mucinex DM and advised against doing this due to the mechanism of the two different medications within the combo. Patient just switched to the cetirizine from the loratadine but neither seem to be helping. Patient  doesn't drink a lot of water but he isn't a big fan of it. He does use the nasal spray when he can. He will try to drink more water.  Patient never brought in his BP cuff into the office. Patient will bring his cuff when he sees his PCP in February.   Patient reports ever since his gallbladder was removed he is having more pain. He doesn't vomit much but just pain in his stomach. He has chills during the day and this affects his whole body.   Conditions to be addressed/monitored:  Hypertension, Hyperlipidemia, Heart Failure, Coronary Artery Disease, GERD, and Allergic Rhinitis  Conditions addressed this visit: Hypertension, GERD, pain  Care Plan : CSelma Updates made by PViona Gilmore RNiobrarasince 12/30/2021 12:00 AM     Problem: Problem: Hypertension, Hyperlipidemia, Heart Failure, Coronary Artery Disease, GERD, and Allergic Rhinitis      Long-Range Goal: Patient-Specific Goal   Start Date: 11/11/2020  Expected End Date: 11/11/2021  Recent Progress: On track  Priority: High  Note:   Current Barriers:  Unable to independently monitor therapeutic efficacy  Pharmacist Clinical Goal(s):  Patient will achieve adherence to monitoring guidelines and medication adherence to achieve therapeutic efficacy through collaboration with PharmD and provider.   Interventions: 1:1 collaboration with HIsaac Bliss ERayford Halsted MD regarding development and update of comprehensive plan of care as evidenced by provider attestation and co-signature Inter-disciplinary care team collaboration (see longitudinal plan of care) Comprehensive medication review performed; medication list updated in electronic medical record  Hypertension (BP  goal <140/90) -Controlled -Current treatment: Amlodipine 2.17m, 2 tablets once daily - in AM - Appropriate, Effective, Safe, Accessible nebivolol (Bystolic) 166QH 1 tablet once daily - in AM - Appropriate, Effective, Safe, Accessible -Medications  previously tried: losartan  -Current home readings: 130s/70s, 149/67, 151/72, 147/81, 125/71, 131/69, 149/69, 147/69, 128/59, 144/68 (checking every 2-3 days) - arm cuff (medicare gave it him) - -Current dietary habits: did not discuss -Current exercise habits: minimal due to pain -Denies hypotensive/hypertensive symptoms -Educated on BP goals and benefits of medications for prevention of heart attack, stroke and kidney damage; Importance of home blood pressure monitoring; Proper BP monitoring technique; Symptoms of hypotension and importance of maintaining adequate hydration; -Counseled to monitor BP at home weekly, document, and provide log at future appointments -Counseled on diet and exercise extensively Recommended to continue current medication  Hyperlipidemia: (LDL goal < 70) -Controlled -Current treatment: Rosuvastatin 269m 1 tablet once daily - Appropriate, Effective, Safe, Accessible Ezetimibe (Zetia) 1057m1 tablet once daily - Appropriate, Effective, Safe, Accessible -Medications previously tried: atorvastatin, simvastatin  -Current dietary patterns: did not discuss -Current exercise habits: minimal with pain -Educated on Cholesterol goals;  Benefits of statin for ASCVD risk reduction; -Counseled on diet and exercise extensively Recommended to continue current medication Recommended repeat lipid panel.  History of NSTEMI (Goal: prevent future heart events) -Controlled -Current treatment  Isosorbide mononitrate 55m48m tablet once daily - Appropriate, Effective, Safe, Accessible Nitroglycerin 0.4mg 36m 1 tablet under tongue every five minutes as needed for chest pain - Appropriate, Effective, Safe, Accessible Aspirin 81 mg, 1 tablet once daily - Appropriate, Effective, Safe, Accessible Clopidogrel 75 mg, 1 tablet once daily - Appropriate, Effective, Safe, Accessible -Medications previously tried: none  -Recommended to continue current medication  Heart Failure (Goal:  manage symptoms and prevent exacerbations) -Controlled -Last ejection fraction: 50-55% (Date: 04/10/19) -HF type: Diastolic -NYHA Class: II (slight limitation of activity) -AHA HF Stage: C (Heart disease and symptoms present) -Current treatment: Furosemide 40 mg 1 tablet as needed for edema - Appropriate, Effective, Safe, Accessible -Medications previously tried: HCTZ  -Current home BP/HR readings: refer to above -Current dietary habits: did not discuss -Current exercise habits: minimal with pain -Educated on Benefits of medications for managing symptoms and prolonging life Importance of weighing daily; if you gain more than 3 pounds in one day or 5 pounds in one week, call cardiologist. -Counseled on diet and exercise extensively Recommended to continue current medication  Pain (Goal: minimize pain) -Uncontrolled -Current treatment  Acetaminophen 325mg,68mablets every six hours as needed for mild pain (patient reports taking 1 tablet at night occasionally)  - Appropriate, Effective, Safe, Accessible Menthol analgesic ([place]) apply topically daily as needed for pain - Appropriate, Effective, Safe, Accessible Oxycodone- APAP 10/325g, 1 tablet twice daily as needed for pain (patient reports taking 0.5 tablet in the morning lunch, and sometimes at dinner time) - Appropriate, Effective, Query Safe, Accessible -Medications previously tried: n/a  -Recommended discussing worsening pain with ortho and provided telephone number for previous provider.  Vitamin B12 deficiency (Goal: 211-911) -Controlled -Current treatment  Cyanocobalamin 1000mcg/40minject 1 ML into muscle for 1 dose (once a month) - Appropriate, Effective, Safe, Accessible -Medications previously tried: none  -Recommended repeat vitamin B12 level.  Barrett's esophagus/GERD (Goal: minimize symptoms) -Not ideally controlled -Current treatment  Pantoprazole 40mg, 143msule once daily - Appropriate, Effective, Safe,  Accessible -Medications previously tried: omeprazole (drug interaction with pantoprazole) -Recommended to continue current medication  Allergic rhinitis (Goal: minimize symptoms) -Controlled -Current  treatment  Fluticasone (Flonase) nasal spray, 1 to 2 sprays into both nostrils at bedtime as needed for allergies or rhinitis - Appropriate, Effective, Safe, Accessible Cetirizine 62m, 1 tablet once daily - Appropriate, Effective, Safe, Accessible (alternates with loratadine) -Medications previously tried: n/a  -Recommended to continue current medication  Restless legs syndrome (Goal: minimize symptoms) -Controlled -Current treatment  Ropinirole 221m 1 tablet at bedtime - Appropriate, Effective, Safe, Accessible -Medications previously tried: none  -Recommended to continue current medication  Depression (Goal: minimize symptoms) -Controlled -Current treatment: Venlafaxine 7553m1 tablet once daily - Appropriate, Effective, Safe, Accessible -Medications previously tried/failed: n/a -PHQ9: 0 -GAD7: n/a -Educated on Benefits of medication for symptom control Benefits of cognitive-behavioral therapy with or without medication -Recommended to continue current medication  Health Maintenance -Vaccine gaps: shingrix -Current therapy:  Florastor 250m44m capsule twice  Metamucil 3 capsules daily  Multivitamin 1 tablet daily Coenzyme Q10 300 mg capsules daily -Educated on Cost vs benefit of each product must be carefully weighed by individual consumer -Patient is satisfied with current therapy and denies issues -Counseled on limiting use of metamucil as this could be contributing to loose stools.  Patient Goals/Self-Care Activities Patient will:  - take medications as prescribed check blood pressure weekly, document, and provide at future appointments  Follow Up Plan: The care management team will reach out to the patient again over the next 30 days.        Medication  Assistance: None required.  Patient affirms current coverage meets needs.  Compliance/Adherence/Medication fill history: Care Gaps: Shingrix, foot exam, eye exam, Hep C screening, AWV Last BP - 140/62 on 12/17/2021 Last A1C - 5.9 on 12/04/2021  Star[facility] Drugs: Rosuvastatin 10 mg - last filled 12/12/2021 100 DS at [facility]  [facility]:  [facility] [place] [place]: [phone]: [id number]: [phone]: [phone] pill box? Yes Pt endorses 95% compliance  We discussed: Current pharmacy is preferred with insurance plan and patient is satisfied with pharmacy services Patient decided to: Continue current medication management strategy  Care Plan and Follow Up Patient Decision:  Patient agrees to Care Plan and Follow-up.  Plan: The care management team will reach out to the patient again over the next 30 days.  MadeJeni SallesarmD, BCACKingsfordrmacist [place]rasVan [place]

## 2021-12-30 NOTE — Patient Instructions (Signed)
Hi [name],  It was great to catch up again! I hope you feel better soon. Let me know how the plain Mucinex 1200 mg twice a day works.  Don't forget to bring your blood pressure cuff to your appointment with Dr. Jerilee Hoh in February.  Please reach out to me if you have any questions or need anything before our follow up!  Best, Maddie  Jeni Salles, PharmD, [place] [facility] [place]   Visit Information   Goals Addressed   None    Patient Care Plan: CCM Pharmacy Care Plan     Problem Identified: Problem: Hypertension, Hyperlipidemia, Heart Failure, Coronary Artery Disease, GERD, and Allergic Rhinitis      Long-Range Goal: Patient-Specific Goal   Start Date: 11/11/2020  Expected End Date: 11/11/2021  Recent Progress: On track  Priority: High  Note:   Current Barriers:  Unable to independently monitor therapeutic efficacy  Pharmacist Clinical Goal(s):  Patient will achieve adherence to monitoring guidelines and medication adherence to achieve therapeutic efficacy through collaboration with PharmD and provider.   Interventions: 1:1 collaboration with Isaac Bliss, Rayford Halsted, MD regarding development and update of comprehensive plan of care as evidenced by provider attestation and co-signature Inter-disciplinary care team collaboration (see longitudinal plan of care) Comprehensive medication review performed; medication list updated in electronic medical record  Hypertension (BP goal <140/90) -Controlled -Current treatment: Amlodipine 2.'5mg'$ , 2 tablets once daily - in AM - Appropriate, Effective, Safe, Accessible nebivolol (Bystolic) '10mg'$ , 1 tablet once daily - in AM - Appropriate, Effective, Safe, Accessible -Medications previously tried: losartan  -Current home readings: 130s/70s, 149/67, 151/72, 147/81, 125/71, 131/69, 149/69, 147/69, 128/59, 144/68 (checking every 2-3 days) - arm cuff (medicare gave it him) - -Current  dietary habits: did not discuss -Current exercise habits: minimal due to pain -Denies hypotensive/hypertensive symptoms -Educated on BP goals and benefits of medications for prevention of heart attack, stroke and kidney damage; Importance of home blood pressure monitoring; Proper BP monitoring technique; Symptoms of hypotension and importance of maintaining adequate hydration; -Counseled to monitor BP at home weekly, document, and provide log at future appointments -Counseled on diet and exercise extensively Recommended to continue current medication  Hyperlipidemia: (LDL goal < 70) -Controlled -Current treatment: Rosuvastatin '20mg'$ , 1 tablet once daily - Appropriate, Effective, Safe, Accessible Ezetimibe (Zetia) '10mg'$ , 1 tablet once daily - Appropriate, Effective, Safe, Accessible -Medications previously tried: atorvastatin, simvastatin  -Current dietary patterns: did not discuss -Current exercise habits: minimal with pain -Educated on Cholesterol goals;  Benefits of statin for ASCVD risk reduction; -Counseled on diet and exercise extensively Recommended to continue current medication Recommended repeat lipid panel.  History of NSTEMI (Goal: prevent future heart events) -Controlled -Current treatment  Isosorbide mononitrate '60mg'$ , 1 tablet once daily - Appropriate, Effective, Safe, Accessible Nitroglycerin 0.'4mg'$  SL, 1 tablet under tongue every five minutes as needed for chest pain - Appropriate, Effective, Safe, Accessible Aspirin 81 mg, 1 tablet once daily - Appropriate, Effective, Safe, Accessible Clopidogrel 75 mg, 1 tablet once daily - Appropriate, Effective, Safe, Accessible -Medications previously tried: none  -Recommended to continue current medication  Heart Failure (Goal: manage symptoms and prevent exacerbations) -Controlled -Last ejection fraction: 50-55% (Date: 04/10/19) -HF type: Diastolic -NYHA Class: II (slight limitation of activity) -AHA HF Stage: C (Heart  disease and symptoms present) -Current treatment: Furosemide 40 mg 1 tablet as needed for edema - Appropriate, Effective, Safe, Accessible -Medications previously tried: HCTZ  -Current home BP/HR readings: refer to above -Current dietary habits: did not  discuss -Current exercise habits: minimal with pain -Educated on Benefits of medications for managing symptoms and prolonging life Importance of weighing daily; if you gain more than 3 pounds in one day or 5 pounds in one week, call cardiologist. -Counseled on diet and exercise extensively Recommended to continue current medication  Pain (Goal: minimize pain) -Uncontrolled -Current treatment  Acetaminophen '325mg'$ , 2 tablets every six hours as needed for mild pain (patient reports taking 1 tablet at night occasionally)  - Appropriate, Effective, Safe, Accessible Menthol analgesic (Bengay) apply topically daily as needed for pain - Appropriate, Effective, Safe, Accessible Oxycodone- APAP 10/325g, 1 tablet twice daily as needed for pain (patient reports taking 0.5 tablet in the morning lunch, and sometimes at dinner time) - Appropriate, Effective, Query Safe, Accessible -Medications previously tried: n/a  -Recommended discussing worsening pain with ortho and provided telephone number for previous provider.  Vitamin B12 deficiency (Goal: 211-911) -Controlled -Current treatment  Cyanocobalamin 1048mg/ml, inject 1 ML into muscle for 1 dose (once a month) - Appropriate, Effective, Safe, Accessible -Medications previously tried: none  -Recommended repeat vitamin B12 level.  Barrett's esophagus/GERD (Goal: minimize symptoms) -Not ideally controlled -Current treatment  Pantoprazole '40mg'$ , 1 capsule once daily - Appropriate, Effective, Safe, Accessible -Medications previously tried: omeprazole (drug interaction with pantoprazole) -Recommended to continue current medication  Allergic rhinitis (Goal: minimize symptoms) -Controlled -Current  treatment  Fluticasone (Flonase) nasal spray, 1 to 2 sprays into both nostrils at bedtime as needed for allergies or rhinitis - Appropriate, Effective, Safe, Accessible Cetirizine '10mg'$ , 1 tablet once daily - Appropriate, Effective, Safe, Accessible (alternates with loratadine) -Medications previously tried: n/a  -Recommended to continue current medication  Restless legs syndrome (Goal: minimize symptoms) -Controlled -Current treatment  Ropinirole '2mg'$ , 1 tablet at bedtime - Appropriate, Effective, Safe, Accessible -Medications previously tried: none  -Recommended to continue current medication  Depression (Goal: minimize symptoms) -Controlled -Current treatment: Venlafaxine '75mg'$ , 1 tablet once daily - Appropriate, Effective, Safe, Accessible -Medications previously tried/failed: n/a -PHQ9: 0 -GAD7: n/a -Educated on Benefits of medication for symptom control Benefits of cognitive-behavioral therapy with or without medication -Recommended to continue current medication  Health Maintenance -Vaccine gaps: shingrix -Current therapy:  Florastor '250mg'$ , 1 capsule twice  Metamucil 3 capsules daily  Multivitamin 1 tablet daily Coenzyme Q10 300 mg capsules daily -Educated on Cost vs benefit of each product must be carefully weighed by individual consumer -Patient is satisfied with current therapy and denies issues -Counseled on limiting use of metamucil as this could be contributing to loose stools.  Patient Goals/Self-Care Activities Patient will:  - take medications as prescribed check blood pressure weekly, document, and provide at future appointments  Follow Up Plan: The care management team will reach out to the patient again over the next 30 days.         Patient verbalizes understanding of instructions and care plan provided today and agrees to view in [facility] status and patient understanding of how to access instructions and care plan via MyChart confirmed  with patient.    The pharmacy team will reach out to the patient again over the next 30 days.   MViona Gilmore [facility]

## 2022-01-01 ENCOUNTER — Telehealth: Payer: Self-pay | Admitting: *Deleted

## 2022-01-01 NOTE — Patient Outreach (Signed)
  Care Coordination   01/01/2022 Name: DESTINY TRICKEY MRN: [id number] DOB: March 02, 1940   Care Coordination Outreach Attempts:  An unsuccessful telephone outreach was attempted today to offer the patient information about available care coordination services as a benefit of their health plan.   Follow Up Plan:  Additional outreach attempts will be made to offer the patient care coordination information and services.   Encounter Outcome:  No Answer   Care Coordination Interventions:  No, not indicated    Raina Mina, RN Care Management Coordinator [place] Office [phone]

## 2022-01-02 ENCOUNTER — Other Ambulatory Visit: Payer: Self-pay | Admitting: Cardiovascular Disease

## 2022-01-03 ENCOUNTER — Other Ambulatory Visit: Payer: Self-pay | Admitting: Internal Medicine

## 2022-01-03 DIAGNOSIS — E038 Other specified hypothyroidism: Secondary | ICD-10-CM

## 2022-01-05 NOTE — Telephone Encounter (Signed)
Last OV 12/04/21. No mention of depression during OV or in last year. Will defer to PCP to determine refills.

## 2022-01-07 ENCOUNTER — Other Ambulatory Visit: Payer: Self-pay | Admitting: *Deleted

## 2022-01-07 DIAGNOSIS — E038 Other specified hypothyroidism: Secondary | ICD-10-CM

## 2022-01-07 MED ORDER — VENLAFAXINE HCL 75 MG PO TABS: 75.0000 mg | ORAL_TABLET | Freq: Every day | ORAL | 1 refills | Status: DC

## 2022-01-18 ENCOUNTER — Other Ambulatory Visit: Payer: Self-pay | Admitting: Internal Medicine

## 2022-01-18 DIAGNOSIS — E785 Hyperlipidemia, unspecified: Secondary | ICD-10-CM

## 2022-01-18 DIAGNOSIS — I10 Essential (primary) hypertension: Secondary | ICD-10-CM

## 2022-01-18 DIAGNOSIS — E538 Deficiency of other specified B group vitamins: Secondary | ICD-10-CM

## 2022-01-30 DIAGNOSIS — N35012 Post-traumatic membranous urethral stricture: Secondary | ICD-10-CM | POA: Diagnosis not present

## 2022-01-30 DIAGNOSIS — R3121 Asymptomatic microscopic hematuria: Secondary | ICD-10-CM | POA: Diagnosis not present

## 2022-01-30 DIAGNOSIS — R3912 Poor urinary stream: Secondary | ICD-10-CM | POA: Diagnosis not present

## 2022-01-30 DIAGNOSIS — R3915 Urgency of urination: Secondary | ICD-10-CM | POA: Diagnosis not present

## 2022-01-30 DIAGNOSIS — R82998 Other abnormal findings in urine: Secondary | ICD-10-CM | POA: Diagnosis not present

## 2022-02-03 ENCOUNTER — Telehealth: Payer: Self-pay | Admitting: Nurse Practitioner

## 2022-02-03 MED ORDER — PANTOPRAZOLE SODIUM 40 MG PO TBEC: 40.0000 mg | DELAYED_RELEASE_TABLET | Freq: Every day | ORAL | 2 refills | Status: DC

## 2022-02-03 NOTE — Telephone Encounter (Signed)
Patient is calling to get refill on pantoprazole. It has expired. It needs to be sent to [facility] mail order service. Please advise

## 2022-02-03 NOTE — Addendum Note (Signed)
Addended by: Annabell Howells on: 02/03/2022 04:11 PM   Modules accepted: Orders

## 2022-02-03 NOTE — Telephone Encounter (Signed)
Pantoprazole refilled and sent to [facility].

## 2022-02-04 ENCOUNTER — Telehealth: Payer: Self-pay | Admitting: Pharmacist

## 2022-02-04 NOTE — Progress Notes (Signed)
Care Management & Coordination Services Pharmacy Team  Name: [name]  MRN: [id number] DOB: May 29, 1940  Reason for Encounter: Jacob update   Contacted patient on 02/04/2022 for Jacob disease state and medication adherence call.   Recent office visits:  None  Recent consult visits:  None  Hospital visits:  None  Medications: Outpatient Encounter Medications as of 02/04/2022  Medication Sig   acetaminophen (TYLENOL) 500 MG tablet Take 1,000 mg by mouth every 6 (six) hours as needed for moderate pain.   amLODipine (NORVASC) 2.5 MG tablet TAKE 2 TABLETS BY MOUTH DAILY   cetirizine (ZYRTEC) 10 MG tablet Take 10 mg by mouth daily as needed for allergies.   clopidogrel (PLAVIX) 75 MG tablet TAKE 1 TABLET BY MOUTH ONCE  DAILY   Coenzyme Q10 300 MG CAPS Take 300 mg by mouth every evening.   cyanocobalamin (VITAMIN B12) 1000 MCG/ML injection INJECT 1 ML SUBCUTANEOUSLY ONCE EVERY MONTH   ezetimibe (ZETIA) 10 MG tablet TAKE 1 TABLET BY MOUTH DAILY   Ferrous Sulfate (IRON) 28 MG TABS Take 28 mg by mouth daily.   fluticasone (FLONASE) 50 MCG/ACT nasal spray Place 1 spray into both nostrils daily.   furosemide (LASIX) 40 MG tablet Take 1 tablet (40 mg total) by mouth every other day. KEEP UPCOMING APPOINTMENT FOR FUTURE REFILLS.   hyoscyamine (LEVSIN SL) 0.125 MG SL tablet Place 1 tablet (0.125 mg total) under the tongue every 4 (four) hours as needed. (Patient taking differently: Place 0.125 mg under the tongue every 4 (four) hours as needed (spasms).)   isosorbide mononitrate (IMDUR) 60 MG 24 hr tablet TAKE 1 TABLET BY MOUTH DAILY   Menthol, Topical Analgesic, (BENGAY EX) Apply 1 application topically daily as needed (pain).   METAMUCIL FIBER PO Take 3 capsules by mouth daily.   Multiple Vitamins-Minerals (MULTIVITAMIN WITH MINERALS) tablet Take 1 tablet by mouth daily.   Naphazoline-Pheniramine (OPCON-A) 0.027-0.315 % SOLN Place 1 drop into both eyes daily as needed (itching eyes).    nebivolol (BYSTOLIC) 10 MG tablet Take 1 tablet (10 mg total) by mouth daily. Please schedule appointment for additional refills.   NEEDLE, DISP, 25 G (B-D DISP NEEDLE 25GX1") 25G X 1" MISC Inject 1000 mcg into muscle once a month.   nitroGLYCERIN (NITROSTAT) 0.4 MG SL tablet PLACE 1 TABLET UNDER THE TONGUE EVERY 5 MINUTES AS NEEDED FOR CHEST PAIN   oxyCODONE-acetaminophen (PERCOCET) 10-325 MG tablet Take 1 tablet by mouth 2 (two) times daily as needed for pain.   pantoprazole (PROTONIX) 40 MG tablet Take 1 tablet (40 mg total) by mouth daily.   Probiotic Product (PROBIOTIC ADVANCED PO) Take 1 tablet by mouth every evening.   rOPINIRole (REQUIP) 2 MG tablet TAKE 1 TABLET BY MOUTH AT  BEDTIME   rosuvastatin (CRESTOR) 10 MG tablet TAKE 1 TABLET BY MOUTH DAILY   venlafaxine (EFFEXOR) 75 MG tablet Take 1 tablet (75 mg total) by mouth daily.   No facility-administered encounter medications on file as of 02/04/2022.    Recent vitals BP Readings from Last 3 Encounters:  12/17/21 (!) 140/62  12/04/21 (!) 149/70  11/19/21 (!) 140/62   Pulse Readings from Last 3 Encounters:  12/17/21 62  12/04/21 64  11/19/21 (!) 59   Wt Readings from Last 3 Encounters:  12/17/21 177 lb (80.3 kg)  12/04/21 175 lb (79.4 kg)  11/19/21 177 lb 9.6 oz (80.6 kg)   BMI Readings from Last 3 Encounters:  12/17/21 26.14 kg/m  12/04/21 25.84 kg/m  11/19/21  26.23 kg/m    Recent lab results    Component Value Date/Time   NA 141 12/17/2021 1003   NA 144 12/09/2020 0853   K 4.6 12/17/2021 1003   CL 104 12/17/2021 1003   CO2 32 12/17/2021 1003   GLUCOSE 101 (H) 12/17/2021 1003   BUN 23 12/17/2021 1003   BUN 21 12/09/2020 0853   CREATININE 1.46 12/17/2021 1003   CREATININE 1.61 (H) 11/09/2019 0939   CALCIUM 9.0 12/17/2021 1003    Lab Results  Component Value Date   CREATININE 1.46 12/17/2021   GFR 45.43 (L) 12/17/2021   EGFR 51 (L) 12/09/2020   GFRNONAA 48 (L) 05/20/2021   GFRAA 47 (L) 11/13/2019    Lab Results  Component Value Date/Time   HGBA1C 5.9 (A) 12/04/2021 09:44 AM   HGBA1C 6.1 (A) 09/04/2021 09:29 AM   HGBA1C 7.0 (H) 03/31/2018 01:09 AM   MICROALBUR 15.6 (H) 06/02/2021 10:33 AM    Lab Results  Component Value Date   CHOL 118 12/09/2020   HDL 36 (L) 12/09/2020   LDLCALC 62 12/09/2020   LDLDIRECT 90.3 04/17/2008   TRIG 104 12/09/2020   CHOLHDL 3.3 12/09/2020   Follow up congestion from 12/30/2021 visit with Jeni Salles, Clinical Pharmacist.        Patient states he has been using plain Mucinex which is helping some with the congestion, he states he has had this congestion for some time now and has been working with Dr. Isaac Bliss concerning this.   Follow up headaches, have they improved or resolved?        Patient states he continues to wake up most mornings with a mild headache which will resolve on its own through the day, he states he has been working with Dr. Isaac Bliss concerning this.  Has patient been checking blood pressures? Patient states he has been checking his blood pressures a couple times per week, he states they have all been normal readings.   Recent blood pressure readings: Patient did not have his readings with him however, states they are always between 130/65 and 140/80.  Note:  Patient advised to follow with Dr. Isaac Bliss if congestion and headache continue or worsen. Patient agrees.   Care Gaps: AWV - 10/07/2021 message sent to [name]  Last BP - 140/62 on 12/17/2021 Last A1C - 5.9 on 12/04/2021 AWV - never done Foot exam - never done Eye exam - never done Hep C Screen - never done Shingrix - never done Covid - overdue  Star Rating Drugs: Rosuvastatin 10 mg - last filled 12/12/2021 100 DS at [place] [phone]

## 2022-02-06 ENCOUNTER — Encounter: Payer: Self-pay | Admitting: Gastroenterology

## 2022-02-10 ENCOUNTER — Encounter: Payer: Medicare Other | Admitting: Gastroenterology

## 2022-02-10 ENCOUNTER — Encounter: Payer: Self-pay | Admitting: Gastroenterology

## 2022-02-10 NOTE — Telephone Encounter (Signed)
Dr Rush Landmark see the message below and advise    Yes you should have stopped the plavix 5 days prior to your procedure.  See the note below in regards to speaking with the CMA on 11/29.  I will forward this message to Dr Rush Landmark to confirm if we can proceed since you have not been off of the blood thinner as recommended.     Annabell Howells, CMA      12/17/21  1:52 PM Note Called pt and informed patient that his cardiologist said it was ok to hold Plavix 5 days prior to procedure (02/05/21)

## 2022-02-10 NOTE — Telephone Encounter (Signed)
I am OK to proceed on Friday, if he has not taken his blood thinner today (4-days off is OK). If however, he has already taken the blood thinner, then we can certainly do the procedures but will not be able to do a a significant dilatation or significant polyp removal with only being off blood thinners for 3-days, though. If he has taken the blood thinner today, then will need to reschedule. Thanks. GM

## 2022-02-11 ENCOUNTER — Telehealth: Payer: Self-pay | Admitting: Nurse Practitioner

## 2022-02-11 NOTE — Telephone Encounter (Signed)
Inbound call from patients daughter stating that patient had taken his blood thinner yesterday and was scheduled to have endo and colon procedure with Dr. Rush Landmark on 1/26 at 3:30. Patients daughter had sent a Mychart message and Dr. Donneta Romberg nurse stated that he needed to be reschedule. Patient was rescheduled for 3/22 at 3:30, but daughter requested I send a message to the nurse to see if he can have procedures done before then because he is going to be upset and will not want to wait that long to have the procedures. Please advise.

## 2022-02-12 NOTE — Telephone Encounter (Signed)
Dr. Rush Landmark, please see message from patient below. Family requests earlier procedure than March 2024. Patty, please contact daughter/patient with Dr. Donneta Romberg response. THX

## 2022-02-12 NOTE — Telephone Encounter (Signed)
Thank you for trying. GM

## 2022-02-12 NOTE — Telephone Encounter (Signed)
I spoke with the pt daughter and made her aware that she will be contacted with any sooner appts.

## 2022-02-13 ENCOUNTER — Other Ambulatory Visit: Payer: Medicare Other | Admitting: Gastroenterology

## 2022-02-13 ENCOUNTER — Encounter: Payer: Medicare Other | Admitting: Gastroenterology

## 2022-02-25 DIAGNOSIS — R3121 Asymptomatic microscopic hematuria: Secondary | ICD-10-CM | POA: Diagnosis not present

## 2022-02-26 ENCOUNTER — Telehealth: Payer: Self-pay

## 2022-02-26 NOTE — Progress Notes (Signed)
Done in error.

## 2022-03-03 ENCOUNTER — Ambulatory Visit (INDEPENDENT_AMBULATORY_CARE_PROVIDER_SITE_OTHER): Payer: Medicare Other | Admitting: Internal Medicine

## 2022-03-03 VITALS — BP 136/78 | HR 61 | Temp 98.3°F | Wt 177.1 lb

## 2022-03-03 DIAGNOSIS — I10 Essential (primary) hypertension: Secondary | ICD-10-CM

## 2022-03-03 DIAGNOSIS — E038 Other specified hypothyroidism: Secondary | ICD-10-CM | POA: Diagnosis not present

## 2022-03-03 DIAGNOSIS — E119 Type 2 diabetes mellitus without complications: Secondary | ICD-10-CM

## 2022-03-03 DIAGNOSIS — G894 Chronic pain syndrome: Secondary | ICD-10-CM | POA: Diagnosis not present

## 2022-03-03 LAB — POCT GLYCOSYLATED HEMOGLOBIN (HGB A1C): Hemoglobin A1C: 6 % — AB (ref 4.0–5.6)

## 2022-03-03 MED ORDER — OXYCODONE-ACETAMINOPHEN 10-325 MG PO TABS: 1.0000 | ORAL_TABLET | Freq: Two times a day (BID) | ORAL | 0 refills | Status: DC | PRN

## 2022-03-03 MED ORDER — VENLAFAXINE HCL 75 MG PO TABS: 75.0000 mg | ORAL_TABLET | Freq: Every day | ORAL | 1 refills | Status: DC

## 2022-03-03 NOTE — Progress Notes (Signed)
Established Patient Office Visit     CC/Reason for Visit: Follow-up chronic conditions  HPI: [name] is a 82 y.o. male who is coming in today for the above mentioned reasons. Past Medical History is significant for: Type 2 diabetes that has been well controlled, heart failure with preserved ejection fraction, chronic pain syndrome with multiple joint pain including neck, shoulders, hands, hips, knees.  He has a history of ascending cholangitis and pancreatitis.  He is here for chronic follow-up.  He tells me that in a recent visit with his urologist he was found to have hematuria and he has a CT scan that has been ordered for next week.   Past Medical/Surgical History: Past Medical History:  Diagnosis Date   Allergy    B12 deficiency anemia    Blood transfusion without reported diagnosis    CAD (coronary artery disease)    Cancer (HCC)    bladder-    Colon polyps    COPD (chronic obstructive pulmonary disease) (HCC)    Depression    Diabetes mellitus (HCC)    Esophagus, Barrett's    GERD (gastroesophageal reflux disease)    History of bladder cancer    Bladder cancer "8 times"   History of hiatal hernia    Hyperlipidemia    Hypertension    Localized osteoarthrosis, lower leg    Myocardial infarction ([place]) 2021   Restless leg syndrome    Sleep apnea    does not wear cpap   Stenosis of esophagus     Past Surgical History:  Procedure Laterality Date   BILIARY BRUSHING  04/01/2018   Procedure: BILIARY BRUSHING;  Surgeon: Irving Copas., MD;  Location: [facility] ENDOSCOPY;  Service: Gastroenterology;;   BILIARY BRUSHING  09/12/2018   Procedure: BILIARY BRUSHING;  Surgeon: Irving Copas., MD;  Location: [facility] ENDOSCOPY;  Service: Gastroenterology;;   BILIARY BRUSHING  11/28/2018   Procedure: BILIARY BRUSHING;  Surgeon: Irving Copas., MD;  Location: [facility] ENDOSCOPY;  Service: Gastroenterology;;   BILIARY BRUSHING  03/11/2020   Procedure: BILIARY  BRUSHING;  Surgeon: Irving Copas., MD;  Location: [facility];  Service: Gastroenterology;;   BILIARY DILATION  09/12/2018   Procedure: BILIARY DILATION;  Surgeon: Irving Copas., MD;  Location: [place];  Service: Gastroenterology;;   BILIARY DILATION  11/28/2018   Procedure: BILIARY DILATION;  Surgeon: Irving Copas., MD;  Location: [place];  Service: Gastroenterology;;   BILIARY DILATION  03/11/2020   Procedure: BILIARY DILATION;  Surgeon: Irving Copas., MD;  Location: [facility];  Service: Gastroenterology;;   BILIARY STENT PLACEMENT  04/01/2018   Procedure: BILIARY STENT PLACEMENT;  Surgeon: Irving Copas., MD;  Location: [place];  Service: Gastroenterology;;   BILIARY STENT PLACEMENT  09/12/2018   Procedure: BILIARY STENT PLACEMENT;  Surgeon: Irving Copas., MD;  Location: [place];  Service: Gastroenterology;;   BILIARY STENT PLACEMENT  11/28/2018   Procedure: BILIARY STENT PLACEMENT;  Surgeon: Irving Copas., MD;  Location: [place];  Service: Gastroenterology;;   BIOPSY  04/01/2018   Procedure: BIOPSY;  Surgeon: Irving Copas., MD;  Location: [place];  Service: Gastroenterology;;   BIOPSY  09/12/2018   Procedure: BIOPSY;  Surgeon: Irving Copas., MD;  Location: [place];  Service: Gastroenterology;;   BIOPSY  11/28/2018   Procedure: BIOPSY;  Surgeon: Irving Copas., MD;  Location: [place];  Service: Gastroenterology;;   BIOPSY  03/11/2020   Procedure: BIOPSY;  Surgeon: Irving Copas.,  MD;  Location: [facility] ENDOSCOPY;  Service: Gastroenterology;;   bladder cancer      x 8 cystoscopy   CERVICAL DISCECTOMY     ACDF   COLONOSCOPY  11/17/2005   normal    CORONARY ARTERY BYPASS GRAFT     x4   CORONARY STENT INTERVENTION N/A 01/05/2019   Procedure: CORONARY STENT INTERVENTION;  Surgeon: Nelva Bush, MD;  Location: [place] CV [facility];  Service:  Cardiovascular;  Laterality: N/A;   ENDOSCOPIC MUCOSAL RESECTION  09/12/2018   Procedure: ENDOSCOPIC MUCOSAL RESECTION;  Surgeon: Rush Landmark, Telford Nab., MD;  Location: [facility] ENDOSCOPY;  Service: Gastroenterology;;   ENDOSCOPIC RETROGRADE CHOLANGIOPANCREATOGRAPHY (ERCP) WITH PROPOFOL N/A 04/01/2018   Procedure: ENDOSCOPIC RETROGRADE CHOLANGIOPANCREATOGRAPHY (ERCP) WITH PROPOFOL;  Surgeon: Irving Copas., MD;  Location: [place];  Service: Gastroenterology;  Laterality: N/A;   ENDOSCOPIC RETROGRADE CHOLANGIOPANCREATOGRAPHY (ERCP) WITH PROPOFOL N/A 09/12/2018   Procedure: ENDOSCOPIC RETROGRADE CHOLANGIOPANCREATOGRAPHY (ERCP) WITH PROPOFOL;  Surgeon: Rush Landmark Telford Nab., MD;  Location: [place];  Service: Gastroenterology;  Laterality: N/A;   ENDOSCOPIC RETROGRADE CHOLANGIOPANCREATOGRAPHY (ERCP) WITH PROPOFOL N/A 03/11/2020   Procedure: ENDOSCOPIC RETROGRADE CHOLANGIOPANCREATOGRAPHY (ERCP) WITH PROPOFOL;  Surgeon: Rush Landmark Telford Nab., MD;  Location: [facility] ENDOSCOPY;  Service: Gastroenterology;  Laterality: N/A;   ENDOSCOPIC RETROGRADE CHOLANGIOPANCREATOGRAPHY (ERCP) WITH PROPOFOL N/A 06/03/2020   Procedure: ENDOSCOPIC RETROGRADE CHOLANGIOPANCREATOGRAPHY (ERCP) WITH PROPOFOL;  Surgeon: Rush Landmark Telford Nab., MD;  Location: [facility] ENDOSCOPY;  Service: Gastroenterology;  Laterality: N/A;   ERCP N/A 11/28/2018   Procedure: ENDOSCOPIC RETROGRADE CHOLANGIOPANCREATOGRAPHY (ERCP) +EGD with spyglass;  Surgeon: Rush Landmark Telford Nab., MD;  Location: [place];  Service: Gastroenterology;  Laterality: N/A;   ESOPHAGOGASTRODUODENOSCOPY  04/29/2010   ESOPHAGOGASTRODUODENOSCOPY (EGD) WITH PROPOFOL N/A 04/01/2018   Procedure: ESOPHAGOGASTRODUODENOSCOPY (EGD) WITH PROPOFOL;  Surgeon: Rush Landmark Telford Nab., MD;  Location: [place];  Service: Gastroenterology;  Laterality: N/A;   ESOPHAGOGASTRODUODENOSCOPY (EGD) WITH PROPOFOL N/A 09/12/2018   Procedure: ESOPHAGOGASTRODUODENOSCOPY (EGD) WITH PROPOFOL;   Surgeon: Rush Landmark Telford Nab., MD;  Location: [place];  Service: Gastroenterology;  Laterality: N/A;   ESOPHAGOGASTRODUODENOSCOPY (EGD) WITH PROPOFOL N/A 11/28/2018   Procedure: ESOPHAGOGASTRODUODENOSCOPY (EGD) WITH PROPOFOL;  Surgeon: Rush Landmark Telford Nab., MD;  Location: [place];  Service: Gastroenterology;  Laterality: N/A;   ESOPHAGOGASTRODUODENOSCOPY (EGD) WITH PROPOFOL N/A 03/11/2020   Procedure: ESOPHAGOGASTRODUODENOSCOPY (EGD) WITH PROPOFOL;  Surgeon: Rush Landmark Telford Nab., MD;  Location: [facility] ENDOSCOPY;  Service: Gastroenterology;  Laterality: N/A;   ESOPHAGOGASTRODUODENOSCOPY (EGD) WITH PROPOFOL N/A 06/03/2020   Procedure: ESOPHAGOGASTRODUODENOSCOPY (EGD) WITH PROPOFOL;  Surgeon: Rush Landmark Telford Nab., MD;  Location: [facility] ENDOSCOPY;  Service: Gastroenterology;  Laterality: N/A;   EUS  04/01/2018   Procedure: FULL UPPER ENDOSCOPIC ULTRASOUND (EUS) RADIAL;  Surgeon: Irving Copas., MD;  Location: [place];  Service: Gastroenterology;;   EUS N/A 09/12/2018   Procedure: UPPER ENDOSCOPIC ULTRASOUND (EUS) RADIAL;  Surgeon: Irving Copas., MD;  Location: [place];  Service: Gastroenterology;  Laterality: N/A;   FINE NEEDLE ASPIRATION  09/12/2018   Procedure: FINE NEEDLE ASPIRATION (FNA) LINEAR;  Surgeon: Irving Copas., MD;  Location: [place];  Service: Gastroenterology;;   HAND SURGERY Left 2018   saw accident   HEMOSTASIS CLIP PLACEMENT  09/12/2018   Procedure: HEMOSTASIS CLIP PLACEMENT;  Surgeon: Irving Copas., MD;  Location: [place];  Service: Gastroenterology;;   HEMOSTASIS CLIP PLACEMENT  06/03/2020   Procedure: HEMOSTASIS CLIP PLACEMENT;  Surgeon: Irving Copas., MD;  Location: [facility];  Service: Gastroenterology;;   I & D EXTREMITY Left 11/24/2016   Procedure: IRRIGATION AND DEBRIDEMENT LEFT HAND,  THUMB, INDEX, MIDDLE, RING, AND SMALL FINGERS WITH RECONSTRUCTION;  Surgeon: Roseanne Kaufman, MD;  Location: [employer];   Service: Orthopedics;  Laterality: Left;   KNEE ARTHROSCOPY Left    LEFT HEART CATH AND CORS/GRAFTS ANGIOGRAPHY N/A 01/05/2019   Procedure: LEFT HEART CATH AND CORS/GRAFTS ANGIOGRAPHY;  Surgeon: Nelva Bush, MD;  Location: [place] [facility];  Service: Cardiovascular;  Laterality: N/A;   LUMBAR LAMINECTOMY     and fusion x 2   NASAL SINUS SURGERY     POPLITEAL SYNOVIAL CYST EXCISION     REMOVAL OF STONES  04/01/2018   Procedure: REMOVAL OF STONES;  Surgeon: Rush Landmark Telford Nab., MD;  Location: [place];  Service: Gastroenterology;;   REMOVAL OF STONES  09/12/2018   Procedure: REMOVAL OF STONES;  Surgeon: Irving Copas., MD;  Location: [place];  Service: Gastroenterology;;   REMOVAL OF STONES  11/28/2018   Procedure: REMOVAL OF STONES;  Surgeon: Irving Copas., MD;  Location: [place];  Service: Gastroenterology;;   REMOVAL OF STONES  03/11/2020   Procedure: REMOVAL OF STONES;  Surgeon: Irving Copas., MD;  Location: [facility];  Service: Gastroenterology;;   REMOVAL OF STONES  06/03/2020   Procedure: REMOVAL OF STONES;  Surgeon: Irving Copas., MD;  Location: [facility];  Service: Gastroenterology;;   Azzie Almas DILATION N/A 09/12/2018   Procedure: Renato Battles;  Surgeon: Irving Copas., MD;  Location: [place];  Service: Gastroenterology;  Laterality: N/A;   SAVORY DILATION N/A 11/28/2018   Procedure: SAVORY DILATION;  Surgeon: Rush Landmark Telford Nab., MD;  Location: [place];  Service: Gastroenterology;  Laterality: N/A;   SEPTOPLASTY Bilateral 05/26/2021   Procedure: SEPTOPLASTY;  Surgeon: Leta Baptist, MD;  Location: [place];  Service: ENT;  Laterality: Bilateral;   SPHINCTEROTOMY  04/01/2018   Procedure: Joan Mayans;  Surgeon: Mansouraty, Telford Nab., MD;  Location: [place];  Service: Gastroenterology;;   Bess Kinds CHOLANGIOSCOPY N/A 11/28/2018   Procedure: [id number] CHOLANGIOSCOPY;  Surgeon:  Irving Copas., MD;  Location: [place];  Service: Gastroenterology;  Laterality: N/A;   SPYGLASS CHOLANGIOSCOPY N/A 06/03/2020   Procedure: SPYGLASS CHOLANGIOSCOPY;  Surgeon: Irving Copas., MD;  Location: [facility];  Service: Gastroenterology;  Laterality: N/A;   STENT REMOVAL  09/12/2018   Procedure: STENT REMOVAL;  Surgeon: Irving Copas., MD;  Location: [place];  Service: Gastroenterology;;   Lavell Islam REMOVAL  11/28/2018   Procedure: STENT REMOVAL;  Surgeon: Irving Copas., MD;  Location: [place];  Service: Gastroenterology;;   Lavell Islam REMOVAL  03/11/2020   Procedure: STENT REMOVAL;  Surgeon: Irving Copas., MD;  Location: [facility];  Service: Gastroenterology;;   SUBMUCOSAL LIFTING INJECTION  09/12/2018   Procedure: SUBMUCOSAL LIFTING INJECTION;  Surgeon: Irving Copas., MD;  Location: [place];  Service: Gastroenterology;;    Social History:  reports that he quit smoking about 45 years ago. His smoking use included cigarettes. He has a 2.50 pack-year smoking history. He has never used smokeless tobacco. He reports that he does not drink alcohol and does not use drugs.  Allergies: No Known Allergies  Family History:  Family History  Problem Relation Age of Onset   Melanoma Mother    Stroke Father    Hypertension Father    Coronary artery disease Other    Colon cancer Neg Hx    Esophageal cancer Neg Hx    Stomach cancer Neg Hx    Rectal cancer Neg Hx    Pancreatic cancer Neg Hx  Liver disease Neg Hx    Inflammatory bowel disease Neg Hx      Current Outpatient Medications:    acetaminophen (TYLENOL) 500 MG tablet, Take 1,000 mg by mouth every 6 (six) hours as needed for moderate pain., Disp: , Rfl:    amLODipine (NORVASC) 2.5 MG tablet, TAKE 2 TABLETS BY MOUTH DAILY, Disp: 180 tablet, Rfl: 3   cetirizine (ZYRTEC) 10 MG tablet, Take 10 mg by mouth daily as needed for allergies., Disp: , Rfl:     clopidogrel (PLAVIX) 75 MG tablet, TAKE 1 TABLET BY MOUTH ONCE  DAILY, Disp: 60 tablet, Rfl: 5   Coenzyme Q10 300 MG CAPS, Take 300 mg by mouth every evening., Disp: , Rfl:    cyanocobalamin (VITAMIN B12) 1000 MCG/ML injection, INJECT 1 ML SUBCUTANEOUSLY ONCE EVERY MONTH, Disp: 6 mL, Rfl: 11   ezetimibe (ZETIA) 10 MG tablet, TAKE 1 TABLET BY MOUTH DAILY, Disp: 100 tablet, Rfl: 1   Ferrous Sulfate (IRON) 28 MG TABS, Take 28 mg by mouth daily., Disp: , Rfl:    fluticasone (FLONASE) 50 MCG/ACT nasal spray, Place 1 spray into both nostrils daily., Disp: , Rfl:    furosemide (LASIX) 40 MG tablet, Take 1 tablet (40 mg total) by mouth every other day. KEEP UPCOMING APPOINTMENT FOR FUTURE REFILLS., Disp: 15 tablet, Rfl: 0   hyoscyamine (LEVSIN SL) 0.125 MG SL tablet, Place 1 tablet (0.125 mg total) under the tongue every 4 (four) hours as needed. (Patient taking differently: Place 0.125 mg under the tongue every 4 (four) hours as needed (spasms).), Disp: 30 tablet, Rfl: 2   isosorbide mononitrate (IMDUR) 60 MG 24 hr tablet, TAKE 1 TABLET BY MOUTH DAILY, Disp: 90 tablet, Rfl: 3   Menthol, Topical Analgesic, (BENGAY EX), Apply 1 application topically daily as needed (pain)., Disp: , Rfl:    METAMUCIL FIBER PO, Take 3 capsules by mouth daily., Disp: , Rfl:    Multiple Vitamins-Minerals (MULTIVITAMIN WITH MINERALS) tablet, Take 1 tablet by mouth daily., Disp: , Rfl:    Naphazoline-Pheniramine (OPCON-A) 0.027-0.315 % SOLN, Place 1 drop into both eyes daily as needed (itching eyes)., Disp: , Rfl:    nebivolol (BYSTOLIC) 10 MG tablet, Take 1 tablet (10 mg total) by mouth daily. Please schedule appointment for additional refills., Disp: 100 tablet, Rfl: 1   NEEDLE, DISP, 25 G (B-D DISP NEEDLE 25GX1") 25G X 1" MISC, Inject 1000 mcg into muscle once a month., Disp: 50 each, Rfl: 0   nitroGLYCERIN (NITROSTAT) 0.4 MG SL tablet, PLACE 1 TABLET UNDER THE TONGUE EVERY 5 MINUTES AS NEEDED FOR CHEST PAIN, Disp: 25 tablet,  Rfl: 2   oxyCODONE-acetaminophen (PERCOCET) 10-325 MG tablet, Take 1 tablet by mouth 2 (two) times daily as needed for pain., Disp: 60 tablet, Rfl: 0   pantoprazole (PROTONIX) 40 MG tablet, Take 1 tablet (40 mg total) by mouth daily., Disp: 90 tablet, Rfl: 2   Probiotic Product (PROBIOTIC ADVANCED PO), Take 1 tablet by mouth every evening., Disp: , Rfl:    rOPINIRole (REQUIP) 2 MG tablet, TAKE 1 TABLET BY MOUTH AT  BEDTIME, Disp: 100 tablet, Rfl: 2   rosuvastatin (CRESTOR) 10 MG tablet, TAKE 1 TABLET BY MOUTH DAILY, Disp: 60 tablet, Rfl: 5   venlafaxine (EFFEXOR) 75 MG tablet, Take 1 tablet (75 mg total) by mouth daily., Disp: 90 tablet, Rfl: 1  Review of Systems:  Negative unless indicated in HPI.   Physical Exam: Vitals:   03/03/22 0946 03/03/22 0953  BP: (!) 143/74 136/78  Pulse: 61   Temp: 98.3 F (36.8 C)   TempSrc: Oral   SpO2: 94%   Weight: 177 lb 1.6 oz (80.3 kg)     Body mass index is 26.15 kg/m.   Physical Exam Vitals reviewed.  Constitutional:      Appearance: Normal appearance.  HENT:     Head: Normocephalic and atraumatic.  Eyes:     Conjunctiva/sclera: Conjunctivae normal.     Pupils: Pupils are equal, round, and reactive to light.  Cardiovascular:     Rate and Rhythm: Normal rate and regular rhythm.  Pulmonary:     Effort: Pulmonary effort is normal.     Breath sounds: Normal breath sounds.  Skin:    General: Skin is warm and dry.  Neurological:     General: No focal deficit present.     Mental Status: He is alert and oriented to person, place, and time.  Psychiatric:        Mood and Affect: Mood normal.        Behavior: Behavior normal.        Thought Content: Thought content normal.        Judgment: Judgment normal.    Diabetic Foot Exam - Simple   Simple Foot Form Diabetic Foot exam was performed with the following findings: Yes 03/03/2022 10:12 AM  Visual Inspection No deformities, no ulcerations, no other skin breakdown bilaterally:  Yes Sensation Testing Intact to touch and monofilament testing bilaterally: Yes Pulse Check Posterior Tibialis and Dorsalis pulse intact bilaterally: Yes Comments Varicosities present Onychomycosis present Decreased vibration bilaterally       Impression and Plan:  Diabetes mellitus type 2, noninsulin dependent ([place]) - Plan: POC HgB A1c  Other specified hypothyroidism - Plan: venlafaxine (EFFEXOR) 75 MG tablet  Chronic pain syndrome  Essential hypertension  -A1c of 6.0 demonstrates excellent diabetic control. -Blood pressure is fairly well-controlled. -PDMP reviewed, overdose risk score is 100, no red flags. -Refill oxycodone 10/325 mg to take 1 tablet twice a day for total of 60 tablets a month x 3 months.  This is a long-term opioid prescription, there has been no sign of misuse.  Time spent:33 minutes reviewing chart, interviewing and examining patient and formulating plan of care.     [name], MD [facility]

## 2022-03-03 NOTE — Patient Instructions (Signed)
-  Nice seeing you today!!  -Diabetes is well controlled.  -Schedule your wellness visit in 3 months.

## 2022-03-04 DIAGNOSIS — R3121 Asymptomatic microscopic hematuria: Secondary | ICD-10-CM | POA: Diagnosis not present

## 2022-03-04 DIAGNOSIS — N200 Calculus of kidney: Secondary | ICD-10-CM | POA: Diagnosis not present

## 2022-03-04 DIAGNOSIS — C679 Malignant neoplasm of bladder, unspecified: Secondary | ICD-10-CM | POA: Diagnosis not present

## 2022-03-18 ENCOUNTER — Other Ambulatory Visit: Payer: Self-pay | Admitting: Urology

## 2022-03-18 ENCOUNTER — Telehealth: Payer: Self-pay

## 2022-03-18 ENCOUNTER — Telehealth: Payer: Self-pay | Admitting: Cardiovascular Disease

## 2022-03-18 NOTE — Telephone Encounter (Signed)
   Pre-operative Risk Assessment    Patient Name: [name]  DOB: 1940-08-30 MRN: [id number]      Request for Surgical Clearance    Procedure:   right ureteroscopy with biopsy   Date of Surgery:  Clearance 03/27/22                             +    Surgeon:  Dr. Sherrell Puller Group or Practice Name:  [facility] Phone number:  [phone] Fax number:  [phone]   Type of Clearance Requested:   - Medical  - Pharmacy:  Hold Clopidogrel (Plavix) 5 days   Type of Anesthesia:  General    Additional requests/questions:  Does this patient need antibiotics?    Signed, Milbert Coulter   03/18/2022, 10:35 AM

## 2022-03-18 NOTE — Telephone Encounter (Signed)
   Name: [name]  DOB: 1940/10/19  MRN: [id number]  Primary Cardiologist: [name], MD   Preoperative team, please contact this patient and set up a phone call appointment for further preoperative risk assessment. Please obtain consent and complete medication review. Thank you for your help.  I confirm that guidance regarding antiplatelet and oral anticoagulation therapy has been completed and, if necessary, noted below.  OK to hold plavix 5 days   Ledora Bottcher, [place] 03/18/2022, 1:42 PM [facility]

## 2022-03-18 NOTE — Telephone Encounter (Addendum)
Patient scheduled for telehealth visit tomorrow 2/29. Med rec and consent done

## 2022-03-18 NOTE — Telephone Encounter (Signed)
  Patient Consent for Virtual Visit         [name] has provided verbal consent on 03/18/2022 for a virtual visit (video or telephone).   CONSENT FOR VIRTUAL VISIT FOR:  [name]  By participating in this virtual visit I agree to the following:  I hereby voluntarily request, consent and authorize [facility] and its employed or contracted physicians, physician assistants, nurse practitioners or other licensed health care professionals (the Practitioner), to provide me with telemedicine health care services (the "Services") as deemed necessary by the treating Practitioner. I acknowledge and consent to receive the Services by the Practitioner via telemedicine. I understand that the telemedicine visit will involve communicating with the Practitioner through live audiovisual communication technology and the disclosure of certain medical information by electronic transmission. I acknowledge that I have been given the opportunity to request an in-person assessment or other available alternative prior to the telemedicine visit and am voluntarily participating in the telemedicine visit.  I understand that I have the right to withhold or withdraw my consent to the use of telemedicine in the course of my care at any time, without affecting my right to future care or treatment, and that the Practitioner or I may terminate the telemedicine visit at any time. I understand that I have the right to inspect all information obtained and/or recorded in the course of the telemedicine visit and may receive copies of available information for a reasonable fee.  I understand that some of the potential risks of receiving the Services via telemedicine include:  Delay or interruption in medical evaluation due to technological equipment failure or disruption; Information transmitted may not be sufficient (e.g. poor resolution of images) to allow for appropriate medical decision making by the  Practitioner; and/or  In rare instances, security protocols could fail, causing a breach of personal health information.  Furthermore, I acknowledge that it is my responsibility to provide information about my medical history, conditions and care that is complete and accurate to the best of my ability. I acknowledge that Practitioner's advice, recommendations, and/or decision may be based on factors not within their control, such as incomplete or inaccurate data provided by me or distortions of diagnostic images or specimens that may result from electronic transmissions. I understand that the practice of medicine is not an exact science and that Practitioner makes no warranties or guarantees regarding treatment outcomes. I acknowledge that a copy of this consent can be made available to me via my patient portal ([facility]), or I can request a printed copy by calling the office of [place].    I understand that my insurance will be billed for this visit.   I have read or had this consent read to me. I understand the contents of this consent, which adequately explains the benefits and risks of the Services being provided via telemedicine.  I have been provided ample opportunity to ask questions regarding this consent and the Services and have had my questions answered to my satisfaction. I give my informed consent for the services to be provided through the use of telemedicine in my medical care

## 2022-03-19 ENCOUNTER — Ambulatory Visit: Payer: Medicare Other | Attending: Internal Medicine

## 2022-03-19 DIAGNOSIS — Z01810 Encounter for preprocedural cardiovascular examination: Secondary | ICD-10-CM | POA: Diagnosis not present

## 2022-03-19 NOTE — Progress Notes (Signed)
Virtual Visit via Telephone Note   Because of [name]'s co-morbid illnesses, he is at least at moderate risk for complications without adequate follow up.  This format is felt to be most appropriate for this patient at this time.  The patient did not have access to video technology/had technical difficulties with video requiring transitioning to audio format only (telephone).  All issues noted in this document were discussed and addressed.  No physical exam could be performed with this format.  Please refer to the patient's chart for his consent to telehealth for [facility].  Evaluation Performed:  Preoperative cardiovascular risk assessment _____________   Date:  03/19/2022   Patient ID:  [name], DOB 01/11/1941, MRN [id number] Patient Location:  Home Provider location:   Office  Primary Care Provider:  Isaac Bliss, Rayford Halsted, MD Primary Cardiologist:  [name], MD  Chief Complaint / Patient Profile   82 y.o. y/o male with a h/o CAD s/p CABG 2001 with PCI in 2020, Grade 1 DD, GERD, emphysema, OSA with CPAP use who is pending right ureteroscopy with biopsy and presents today for telephonic preoperative cardiovascular risk assessment.  History of Present Illness    [name] is a 82 y.o. male who presents via audio/video conferencing for a telehealth visit today.  Pt was last seen in cardiology clinic on 11/19/21 by Dr. Claiborne Billings.  At that time [name] was doing well .  The patient is now pending procedure as outlined above. Since his last visit, he says he is always SOB but this has been stable. He has a bad knee and body pain that limits his activity. No chest pain. He has scored a 5.29 on the DASI, this exceed the 4 mets minimum requirement.  Okay to hold plavix x 5 days prior to procedure and resume when medically safe to do so.  Past Medical History    Past Medical History:  Diagnosis Date   Allergy    B12 deficiency anemia     Blood transfusion without reported diagnosis    CAD (coronary artery disease)    Cancer (HCC)    bladder-    Colon polyps    COPD (chronic obstructive pulmonary disease) (HCC)    Depression    Diabetes mellitus (HCC)    Esophagus, Barrett's    GERD (gastroesophageal reflux disease)    History of bladder cancer    Bladder cancer "8 times"   History of hiatal hernia    Hyperlipidemia    Hypertension    Localized osteoarthrosis, lower leg    Myocardial infarction ([place]) 2021   Restless leg syndrome    Sleep apnea    does not wear cpap   Stenosis of esophagus    Past Surgical History:  Procedure Laterality Date   BILIARY BRUSHING  04/01/2018   Procedure: BILIARY BRUSHING;  Surgeon: Irving Copas., MD;  Location: [facility] ENDOSCOPY;  Service: Gastroenterology;;   BILIARY BRUSHING  09/12/2018   Procedure: BILIARY BRUSHING;  Surgeon: Irving Copas., MD;  Location: [facility] ENDOSCOPY;  Service: Gastroenterology;;   BILIARY BRUSHING  11/28/2018   Procedure: BILIARY BRUSHING;  Surgeon: Irving Copas., MD;  Location: [facility] ENDOSCOPY;  Service: Gastroenterology;;   BILIARY BRUSHING  03/11/2020   Procedure: BILIARY BRUSHING;  Surgeon: Irving Copas., MD;  Location: [facility]OS[facility];  Service: Gastroenterology;;   BILIARY DILATION  09/12/2018   Procedure: BILIARY DILATION;  Surgeon: Irving Copas., MD;  Location: [place];  Service:  Gastroenterology;;   BILIARY DILATION  11/28/2018   Procedure: BILIARY DILATION;  Surgeon: Rush Landmark Telford Nab., MD;  Location: [place];  Service: Gastroenterology;;   BILIARY DILATION  03/11/2020   Procedure: BILIARY DILATION;  Surgeon: Irving Copas., MD;  Location: [facility] ENDOSCOPY;  Service: Gastroenterology;;   BILIARY STENT PLACEMENT  04/01/2018   Procedure: BILIARY STENT PLACEMENT;  Surgeon: Irving Copas., MD;  Location: [place];  Service: Gastroenterology;;   BILIARY STENT PLACEMENT  09/12/2018    Procedure: BILIARY STENT PLACEMENT;  Surgeon: Irving Copas., MD;  Location: [place];  Service: Gastroenterology;;   BILIARY STENT PLACEMENT  11/28/2018   Procedure: BILIARY STENT PLACEMENT;  Surgeon: Irving Copas., MD;  Location: [place];  Service: Gastroenterology;;   BIOPSY  04/01/2018   Procedure: BIOPSY;  Surgeon: Irving Copas., MD;  Location: [place];  Service: Gastroenterology;;   BIOPSY  09/12/2018   Procedure: BIOPSY;  Surgeon: Irving Copas., MD;  Location: [place];  Service: Gastroenterology;;   BIOPSY  11/28/2018   Procedure: BIOPSY;  Surgeon: Irving Copas., MD;  Location: [place];  Service: Gastroenterology;;   BIOPSY  03/11/2020   Procedure: BIOPSY;  Surgeon: Irving Copas., MD;  Location: [facility] ENDOSCOPY;  Service: Gastroenterology;;   bladder cancer      x 8 cystoscopy   CERVICAL DISCECTOMY     ACDF   COLONOSCOPY  11/17/2005   normal    CORONARY ARTERY BYPASS GRAFT     x4   CORONARY STENT INTERVENTION N/A 01/05/2019   Procedure: CORONARY STENT INTERVENTION;  Surgeon: Nelva Bush, MD;  Location: [place] CV [facility];  Service: Cardiovascular;  Laterality: N/A;   ENDOSCOPIC MUCOSAL RESECTION  09/12/2018   Procedure: ENDOSCOPIC MUCOSAL RESECTION;  Surgeon: Rush Landmark, Telford Nab., MD;  Location: [facility] ENDOSCOPY;  Service: Gastroenterology;;   ENDOSCOPIC RETROGRADE CHOLANGIOPANCREATOGRAPHY (ERCP) WITH PROPOFOL N/A 04/01/2018   Procedure: ENDOSCOPIC RETROGRADE CHOLANGIOPANCREATOGRAPHY (ERCP) WITH PROPOFOL;  Surgeon: Irving Copas., MD;  Location: [place];  Service: Gastroenterology;  Laterality: N/A;   ENDOSCOPIC RETROGRADE CHOLANGIOPANCREATOGRAPHY (ERCP) WITH PROPOFOL N/A 09/12/2018   Procedure: ENDOSCOPIC RETROGRADE CHOLANGIOPANCREATOGRAPHY (ERCP) WITH PROPOFOL;  Surgeon: Rush Landmark Telford Nab., MD;  Location: [place];  Service: Gastroenterology;  Laterality: N/A;   ENDOSCOPIC RETROGRADE  CHOLANGIOPANCREATOGRAPHY (ERCP) WITH PROPOFOL N/A 03/11/2020   Procedure: ENDOSCOPIC RETROGRADE CHOLANGIOPANCREATOGRAPHY (ERCP) WITH PROPOFOL;  Surgeon: Rush Landmark Telford Nab., MD;  Location: [facility] ENDOSCOPY;  Service: Gastroenterology;  Laterality: N/A;   ENDOSCOPIC RETROGRADE CHOLANGIOPANCREATOGRAPHY (ERCP) WITH PROPOFOL N/A 06/03/2020   Procedure: ENDOSCOPIC RETROGRADE CHOLANGIOPANCREATOGRAPHY (ERCP) WITH PROPOFOL;  Surgeon: Rush Landmark Telford Nab., MD;  Location: [facility] ENDOSCOPY;  Service: Gastroenterology;  Laterality: N/A;   ERCP N/A 11/28/2018   Procedure: ENDOSCOPIC RETROGRADE CHOLANGIOPANCREATOGRAPHY (ERCP) +EGD with spyglass;  Surgeon: Rush Landmark Telford Nab., MD;  Location: [place];  Service: Gastroenterology;  Laterality: N/A;   ESOPHAGOGASTRODUODENOSCOPY  04/29/2010   ESOPHAGOGASTRODUODENOSCOPY (EGD) WITH PROPOFOL N/A 04/01/2018   Procedure: ESOPHAGOGASTRODUODENOSCOPY (EGD) WITH PROPOFOL;  Surgeon: Rush Landmark Telford Nab., MD;  Location: [place];  Service: Gastroenterology;  Laterality: N/A;   ESOPHAGOGASTRODUODENOSCOPY (EGD) WITH PROPOFOL N/A 09/12/2018   Procedure: ESOPHAGOGASTRODUODENOSCOPY (EGD) WITH PROPOFOL;  Surgeon: Rush Landmark Telford Nab., MD;  Location: [place];  Service: Gastroenterology;  Laterality: N/A;   ESOPHAGOGASTRODUODENOSCOPY (EGD) WITH PROPOFOL N/A 11/28/2018   Procedure: ESOPHAGOGASTRODUODENOSCOPY (EGD) WITH PROPOFOL;  Surgeon: Rush Landmark Telford Nab., MD;  Location: [place];  Service: Gastroenterology;  Laterality: N/A;   ESOPHAGOGASTRODUODENOSCOPY (EGD) WITH PROPOFOL N/A 03/11/2020   Procedure: ESOPHAGOGASTRODUODENOSCOPY (EGD) WITH PROPOFOL;  Surgeon:  Mansouraty, Telford Nab., MD;  Location: [facility] ENDOSCOPY;  Service: Gastroenterology;  Laterality: N/A;   ESOPHAGOGASTRODUODENOSCOPY (EGD) WITH PROPOFOL N/A 06/03/2020   Procedure: ESOPHAGOGASTRODUODENOSCOPY (EGD) WITH PROPOFOL;  Surgeon: Rush Landmark Telford Nab., MD;  Location: [facility] ENDOSCOPY;  Service:  Gastroenterology;  Laterality: N/A;   EUS  04/01/2018   Procedure: FULL UPPER ENDOSCOPIC ULTRASOUND (EUS) RADIAL;  Surgeon: Irving Copas., MD;  Location: [place];  Service: Gastroenterology;;   EUS N/A 09/12/2018   Procedure: UPPER ENDOSCOPIC ULTRASOUND (EUS) RADIAL;  Surgeon: Irving Copas., MD;  Location: [place];  Service: Gastroenterology;  Laterality: N/A;   FINE NEEDLE ASPIRATION  09/12/2018   Procedure: FINE NEEDLE ASPIRATION (FNA) LINEAR;  Surgeon: Irving Copas., MD;  Location: [place];  Service: Gastroenterology;;   HAND SURGERY Left 2018   saw accident   HEMOSTASIS CLIP PLACEMENT  09/12/2018   Procedure: HEMOSTASIS CLIP PLACEMENT;  Surgeon: Irving Copas., MD;  Location: [place];  Service: Gastroenterology;;   HEMOSTASIS CLIP PLACEMENT  06/03/2020   Procedure: HEMOSTASIS CLIP PLACEMENT;  Surgeon: Irving Copas., MD;  Location: [facility];  Service: Gastroenterology;;   I & D EXTREMITY Left 11/24/2016   Procedure: IRRIGATION AND DEBRIDEMENT LEFT HAND, THUMB, INDEX, MIDDLE, RING, AND SMALL FINGERS WITH RECONSTRUCTION;  Surgeon: Roseanne Kaufman, MD;  Location: [place];  Service: Orthopedics;  Laterality: Left;   KNEE ARTHROSCOPY Left    LEFT HEART CATH AND CORS/GRAFTS ANGIOGRAPHY N/A 01/05/2019   Procedure: LEFT HEART CATH AND CORS/GRAFTS ANGIOGRAPHY;  Surgeon: Nelva Bush, MD;  Location: [place] [facility];  Service: Cardiovascular;  Laterality: N/A;   LUMBAR LAMINECTOMY     and fusion x 2   NASAL SINUS SURGERY     POPLITEAL SYNOVIAL CYST EXCISION     REMOVAL OF STONES  04/01/2018   Procedure: REMOVAL OF STONES;  Surgeon: Rush Landmark Telford Nab., MD;  Location: [place];  Service: Gastroenterology;;   REMOVAL OF STONES  09/12/2018   Procedure: REMOVAL OF STONES;  Surgeon: Irving Copas., MD;  Location: [place];  Service: Gastroenterology;;   REMOVAL OF STONES  11/28/2018   Procedure: REMOVAL OF STONES;   Surgeon: Irving Copas., MD;  Location: [place];  Service: Gastroenterology;;   REMOVAL OF STONES  03/11/2020   Procedure: REMOVAL OF STONES;  Surgeon: Irving Copas., MD;  Location: [facility];  Service: Gastroenterology;;   REMOVAL OF STONES  06/03/2020   Procedure: REMOVAL OF STONES;  Surgeon: Irving Copas., MD;  Location: [facility];  Service: Gastroenterology;;   Azzie Almas DILATION N/A 09/12/2018   Procedure: Renato Battles;  Surgeon: Irving Copas., MD;  Location: [place];  Service: Gastroenterology;  Laterality: N/A;   SAVORY DILATION N/A 11/28/2018   Procedure: SAVORY DILATION;  Surgeon: Rush Landmark Telford Nab., MD;  Location: [place];  Service: Gastroenterology;  Laterality: N/A;   SEPTOPLASTY Bilateral 05/26/2021   Procedure: SEPTOPLASTY;  Surgeon: Leta Baptist, MD;  Location: [place];  Service: ENT;  Laterality: Bilateral;   SPHINCTEROTOMY  04/01/2018   Procedure: Joan Mayans;  Surgeon: Mansouraty, Telford Nab., MD;  Location: [place];  Service: Gastroenterology;;   Bess Kinds CHOLANGIOSCOPY N/A 11/28/2018   Procedure: [id number] CHOLANGIOSCOPY;  Surgeon: Irving Copas., MD;  Location: [place];  Service: Gastroenterology;  Laterality: N/A;   SPYGLASS CHOLANGIOSCOPY N/A 06/03/2020   Procedure: SPYGLASS CHOLANGIOSCOPY;  Surgeon: Irving Copas., MD;  Location[facility];  Service: Gastroenterology;  Laterality: N/A;   STENT REMOVAL  09/12/2018   Procedure: STENT REMOVAL;  Surgeon: Irving Copas., MD;  Location: [place];  Service: Gastroenterology;;   Lavell Islam REMOVAL  11/28/2018   Procedure: Lavell Islam REMOVAL;  Surgeon: Irving Copas., MD;  Location: [place];  Service: Gastroenterology;;   Lavell Islam REMOVAL  03/11/2020   Procedure: STENT REMOVAL;  Surgeon: Irving Copas., MD;  Location[facility];  Service: Gastroenterology;;   Maryagnes Amos INJECTION  09/12/2018    Procedure: SUBMUCOSAL LIFTING INJECTION;  Surgeon: Irving Copas., MD;  Location: [place];  Service: Gastroenterology;;    Allergies  No Known Allergies  Home Medications    Prior to Admission medications   Medication Sig Start Date End Date Taking? Authorizing Provider  acetaminophen (TYLENOL) 500 MG tablet Take 1,000 mg by mouth every 6 (six) hours as needed for moderate pain.    [provider]  amLODipine (NORVASC) 2.5 MG tablet TAKE 2 TABLETS BY MOUTH DAILY 01/05/22   Troy Sine, MD  cetirizine (ZYRTEC) 10 MG tablet Take 10 mg by mouth daily as needed for allergies.    [provider]  clopidogrel (PLAVIX) 75 MG tablet TAKE 1 TABLET BY MOUTH ONCE  DAILY 01/05/22   Troy Sine, MD  Coenzyme Q10 300 MG CAPS Take 300 mg by mouth every evening.    [provider]  cyanocobalamin (VITAMIN B12) 1000 MCG/ML injection INJECT 1 ML SUBCUTANEOUSLY ONCE EVERY MONTH 01/20/22   Isaac Bliss, Rayford Halsted, MD  ezetimibe (ZETIA) 10 MG tablet TAKE 1 TABLET BY MOUTH DAILY 11/11/21   Isaac Bliss, Rayford Halsted, MD  Ferrous Sulfate (IRON) 28 MG TABS Take 28 mg by mouth daily.    [provider]  fluticasone (FLONASE) 50 MCG/ACT nasal spray Place 1 spray into both nostrils daily.    [provider]  furosemide (LASIX) 40 MG tablet Take 1 tablet (40 mg total) by mouth every other day. KEEP UPCOMING APPOINTMENT FOR FUTURE REFILLS. 10/06/21   Troy Sine, MD  hyoscyamine (LEVSIN SL) 0.125 MG SL tablet Place 1 tablet (0.125 mg total) under the tongue every 4 (four) hours as needed. Patient taking differently: Place 0.125 mg under the tongue every 4 (four) hours as needed (spasms). 04/24/20   Mansouraty, Telford Nab., MD  isosorbide mononitrate (IMDUR) 60 MG 24 hr tablet TAKE 1 TABLET BY MOUTH DAILY 01/05/22   Troy Sine, MD  Menthol, Topical Analgesic, (BENGAY EX) Apply 1 application topically daily as needed (pain).    [provider]  METAMUCIL FIBER PO Take 3 capsules by mouth daily.    [provider]  Multiple Vitamins-Minerals (MULTIVITAMIN WITH MINERALS) tablet Take 1 tablet by mouth daily.    [provider]  Naphazoline-Pheniramine (OPCON-A) 0.027-0.315 % SOLN Place 1 drop into both eyes daily as needed (itching eyes).    [provider]  nebivolol (BYSTOLIC) 10 MG tablet Take 1 tablet (10 mg total) by mouth daily. Please schedule appointment for additional refills. 11/20/21   Troy Sine, MD  NEEDLE, DISP, 25 G (B-D DISP NEEDLE 25GX1") 25G X 1" MISC Inject 1000 mcg into muscle once a month. 12/24/17   Isaac Bliss, Rayford Halsted, MD  nitroGLYCERIN (NITROSTAT) 0.4 MG SL tablet PLACE 1 TABLET UNDER THE TONGUE EVERY 5 MINUTES AS NEEDED FOR CHEST PAIN 08/01/20   Isaac Bliss, Rayford Halsted, MD  oxyCODONE-acetaminophen (PERCOCET) 10-325 MG tablet Take 1 tablet by mouth 2 (two) times daily as needed for pain. 03/03/22   Isaac Bliss, Rayford Halsted, MD  oxyCODONE-acetaminophen (PERCOCET) 10-325 MG tablet  Take 1 tablet by mouth 2 (two) times daily as needed for pain. 03/03/22   Isaac Bliss, Rayford Halsted, MD  oxyCODONE-acetaminophen (PERCOCET) 10-325 MG tablet Take 1 tablet by mouth 2 (two) times daily as needed for pain. 03/03/22   Isaac Bliss, Rayford Halsted, MD  pantoprazole (PROTONIX) 40 MG tablet Take 1 tablet (40 mg total) by mouth daily. 02/03/22   Noralyn Pick, NP  Probiotic Product (PROBIOTIC ADVANCED PO) Take 1 tablet by mouth every evening.    [provider]  rOPINIRole (REQUIP) 2 MG tablet TAKE 1 TABLET BY MOUTH AT  BEDTIME 12/15/21   Isaac Bliss, Rayford Halsted, MD  rosuvastatin (CRESTOR) 10 MG tablet TAKE 1 TABLET BY MOUTH DAILY 11/10/21   Troy Sine, MD  venlafaxine (EFFEXOR) 75 MG tablet Take 1 tablet (75 mg total) by mouth daily. 03/03/22   Isaac Bliss, Rayford Halsted, MD    Physical Exam    Vital Signs:  [name] does not have vital signs available for  review today.  Given telephonic nature of communication, physical exam is limited. AAOx3. NAD. Normal affect.  Speech and respirations are unlabored.  Accessory Clinical Findings    None  Assessment & Plan    1.  Preoperative Cardiovascular Risk Assessment:  [name] perioperative risk of a major cardiac event is 0.9% according to the Revised Cardiac Risk Index (RCRI).  Therefore, he is at low risk for perioperative complications.   His functional capacity is good at 5.29 METs according to the [facility] Activity Status Index (DASI). Recommendations: According to ACC/AHA guidelines, no further cardiovascular testing needed.  The patient may proceed to surgery at acceptable risk.   Antiplatelet and/or Anticoagulation Recommendations: Clopidogrel (Plavix) can be held for 5 days prior to his surgery and resumed as soon as possible post op.  The patient was advised that if he develops new symptoms prior to surgery to contact our office to arrange for a follow-up visit, and he verbalized understanding.   A copy of this note will be routed to requesting surgeon.  Time:   Today, I have spent 10 minutes with the patient with telehealth technology discussing medical history, symptoms, and management plan.     Elgie Collard, PA-C  03/19/2022, 3:19 PM

## 2022-03-20 NOTE — Patient Instructions (Signed)
SURGICAL WAITING ROOM VISITATION Patients having surgery or a procedure may have no more than 2 support people in the waiting area - these visitors may rotate.    If the patient needs to stay at the hospital during part of their recovery, the visitor guidelines for inpatient rooms apply. Pre-op nurse will coordinate an appropriate time for 1 support person to accompany patient in pre-op.  This support person may not rotate.    Please refer to the [facility] website for the visitor guidelines for Inpatients (after your surgery is over and you are in a regular room).   Due to an increase in RSV and influenza rates and associated hospitalizations, children ages 89 and under may not visit patients in [place].     Your procedure is scheduled on:  03-27-22   Report to [facility] Main Entrance    Report to admitting at 7:45 AM   Call this number if you have problems the morning of surgery [phone]   Do not eat food or drink liquids :After Midnight.            If you have questions, please contact your surgeon's office.   FOLLOW ANY ADDITIONAL PRE OP INSTRUCTIONS YOU RECEIVED FROM YOUR SURGEON'S OFFICE!!!     Oral Hygiene is also important to reduce your risk of infection.                                    Remember - BRUSH YOUR TEETH THE MORNING OF SURGERY WITH YOUR REGULAR TOOTHPASTE   Do NOT smoke after Midnight   Take these medicines the morning of surgery with A SIP OF WATER:   Amlodipine  Zyrtec  Ezetimibe  Isosorbide  Nebivolol  Rosuvastatin  Venlafaxine  Oxycodone if needed  Tylenol if needed  Okay to use eyedrops   Hold Plavix 5 days before surgery   Bring CPAP mask and tubing day of surgery.                              You may not have any metal on your body including  jewelry, and body piercing             Do not wear lotions, powders, cologne, or deodorant              Men may shave face and neck.   Do not bring valuables to the  hospital. [place].   Contacts, dentures or bridgework may not be worn into surgery.  DO NOT [place]. PHARMACY WILL DISPENSE MEDICATIONS LISTED ON YOUR MEDICATION LIST TO YOU DURING YOUR ADMISSION [place]!    Patients discharged on the day of surgery will not be allowed to drive home.  Someone NEEDS to stay with you for the first 24 hours after anesthesia.   Special Instructions: Bring a copy of your healthcare power of attorney and living will documents the day of surgery if you haven't scanned them before.              Please read over the following fact sheets you were given: IF YOU HAVE QUESTIONS ABOUT YOUR PRE-OP INSTRUCTIONS PLEASE CALL [place]  If you received a COVID test during your pre-op visit  it is requested that you wear a mask  when out in public, stay away from anyone that may not be feeling well and notify your surgeon if you develop symptoms. If you test positive for Covid or have been in contact with anyone that has tested positive in the last 10 days please notify you surgeon.  [place] - Preparing for Surgery Before surgery, you can play an important role.  Because skin is not sterile, your skin needs to be as free of germs as possible.  You can reduce the number of germs on your skin by washing with CHG (chlorahexidine gluconate) soap before surgery.  CHG is an antiseptic cleaner which kills germs and bonds with the skin to continue killing germs even after washing. Please DO NOT use if you have an allergy to CHG or antibacterial soaps.  If your skin becomes reddened/irritated stop using the CHG and inform your nurse when you arrive at Short Stay. Do not shave (including legs and underarms) for at least 48 hours prior to the first CHG shower.  You may shave your face/neck.  Please follow these instructions carefully:  1.  Shower with CHG Soap the night before surgery and the   morning of surgery.  2.  If you choose to wash your hair, wash your hair first as usual with your normal  shampoo.  3.  After you shampoo, rinse your hair and body thoroughly to remove the shampoo.                             4.  Use CHG as you would any other liquid soap.  You can apply chg directly to the skin and wash.  Gently with a scrungie or clean washcloth.  5.  Apply the CHG Soap to your body ONLY FROM THE NECK DOWN.   Do   not use on face/ open                           Wound or open sores. Avoid contact with eyes, ears mouth and   genitals (private parts).                       Wash face,  Genitals (private parts) with your normal soap.             6.  Wash thoroughly, paying special attention to the area where your    surgery  will be performed.  7.  Thoroughly rinse your body with warm water from the neck down.  8.  DO NOT shower/wash with your normal soap after using and rinsing off the CHG Soap.                9.  Pat yourself dry with a clean towel.            10.  Wear clean pajamas.            11.  Place clean sheets on your bed the night of your first shower and do not  sleep with pets. Day of Surgery : Do not apply any lotions/deodorants the morning of surgery.  Please wear clean clothes to the hospital/surgery center.  FAILURE TO FOLLOW THESE INSTRUCTIONS MAY RESULT IN THE CANCELLATION OF YOUR SURGERY  PATIENT SIGNATURE_________________________________  NURSE SIGNATURE__________________________________  ________________________________________________________________________

## 2022-03-20 NOTE — Progress Notes (Addendum)
COVID Vaccine Completed:  Yes  Date of COVID positive in last 90 days:  PCP - Isaac Bliss, MD Cardiologist - [name], MD Pulmonologist - Christinia Gully, MD (last OV 2021)  Cardiac clearance in Epic dated 03-19-22 by Nicholes Rough, PA-C  Chest x-ray -  EKG - 11-19-21 Epic Stress Test - 01-04-18 Epic ECHO - 04-10-19 Epic Cardiac Cath - 01-05-19 Epic Pacemaker/ICD device last checked: Spinal Cord Stimulator:  Bowel Prep -   Sleep Study -  CPAP -   Fasting Blood Sugar -  Checks Blood Sugar _____ times a day  Last dose of GLP1 agonist-  N/A GLP1 instructions:  N/A   Last dose of SGLT-2 inhibitors-  N/A SGLT-2 instructions: N/A   Blood Thinner Instructions:  Plavix.  Hold x5 days Aspirin Instructions: ASA 81  Last Dose:  Activity level:  Can go up a flight of stairs and perform activities of daily living without stopping and without symptoms of chest pain or shortness of breath.  Able to exercise without symptoms  Unable to go up a flight of stairs without symptoms of     Anesthesia review:  Hx of MI, CAD with hx of CABG,  CHF, COPD, HTN, OSA  Patient denies shortness of breath, fever, cough and chest pain at PAT appointment  Patient verbalized understanding of instructions that were given to them at the PAT appointment. Patient was also instructed that they will need to review over the PAT instructions again at home before surgery.

## 2022-03-23 DIAGNOSIS — R3121 Asymptomatic microscopic hematuria: Secondary | ICD-10-CM | POA: Diagnosis not present

## 2022-03-23 DIAGNOSIS — R9341 Abnormal radiologic findings on diagnostic imaging of renal pelvis, ureter, or bladder: Secondary | ICD-10-CM | POA: Diagnosis not present

## 2022-03-23 DIAGNOSIS — Z8551 Personal history of malignant neoplasm of bladder: Secondary | ICD-10-CM | POA: Diagnosis not present

## 2022-03-24 ENCOUNTER — Other Ambulatory Visit (HOSPITAL_COMMUNITY): Payer: Medicare Other

## 2022-03-25 ENCOUNTER — Other Ambulatory Visit: Payer: Self-pay

## 2022-03-25 ENCOUNTER — Encounter (HOSPITAL_COMMUNITY): Payer: Self-pay

## 2022-03-25 ENCOUNTER — Encounter (HOSPITAL_COMMUNITY)
Admission: RE | Admit: 2022-03-25 | Discharge: 2022-03-25 | Disposition: A | Discharge status: Home / Self Care | Payer: Medicare Other | Source: Ambulatory Visit | Attending: Urology | Admitting: Urology

## 2022-03-25 VITALS — BP 144/65 | HR 59 | Temp 98.3°F | Resp 20 | Ht 69.0 in | Wt 167.0 lb

## 2022-03-25 DIAGNOSIS — I2510 Atherosclerotic heart disease of native coronary artery without angina pectoris: Secondary | ICD-10-CM | POA: Insufficient documentation

## 2022-03-25 DIAGNOSIS — E119 Type 2 diabetes mellitus without complications: Secondary | ICD-10-CM | POA: Diagnosis not present

## 2022-03-25 DIAGNOSIS — Z01812 Encounter for preprocedural laboratory examination: Secondary | ICD-10-CM | POA: Diagnosis not present

## 2022-03-25 DIAGNOSIS — J449 Chronic obstructive pulmonary disease, unspecified: Secondary | ICD-10-CM | POA: Insufficient documentation

## 2022-03-25 DIAGNOSIS — D649 Anemia, unspecified: Secondary | ICD-10-CM | POA: Insufficient documentation

## 2022-03-25 DIAGNOSIS — I10 Essential (primary) hypertension: Secondary | ICD-10-CM | POA: Insufficient documentation

## 2022-03-25 DIAGNOSIS — Z87891 Personal history of nicotine dependence: Secondary | ICD-10-CM | POA: Insufficient documentation

## 2022-03-25 DIAGNOSIS — Z951 Presence of aortocoronary bypass graft: Secondary | ICD-10-CM | POA: Diagnosis not present

## 2022-03-25 DIAGNOSIS — D4101 Neoplasm of uncertain behavior of right kidney: Secondary | ICD-10-CM | POA: Insufficient documentation

## 2022-03-25 HISTORY — DX: Anxiety disorder, unspecified: F41.9

## 2022-03-25 HISTORY — DX: Headache, unspecified: R51.9

## 2022-03-25 HISTORY — DX: Dyspnea, unspecified: R06.00

## 2022-03-25 HISTORY — DX: Pneumonia, unspecified organism: J18.9

## 2022-03-25 LAB — BASIC METABOLIC PANEL
Anion gap: 9 (ref 5–15)
BUN: 18 mg/dL (ref 8–23)
CO2: 28 mmol/L (ref 22–32)
Calcium: 8.9 mg/dL (ref 8.9–10.3)
Chloride: 107 mmol/L (ref 98–111)
Creatinine, Ser: 1.45 mg/dL — ABNORMAL HIGH (ref 0.61–1.24)
GFR, Estimated: 49 mL/min — ABNORMAL LOW (ref >60)
Glucose, Bld: 126 mg/dL — ABNORMAL HIGH (ref 70–99)
Potassium: 4.3 mmol/L (ref 3.5–5.1)
Sodium: 144 mmol/L (ref 135–145)

## 2022-03-25 LAB — CBC
HCT: 41.8 % (ref 39.0–52.0)
Hemoglobin: 13.3 g/dL (ref 13.0–17.0)
MCH: 28.4 pg (ref 26.0–34.0)
MCHC: 31.8 g/dL (ref 30.0–36.0)
MCV: 89.3 fL (ref 80.0–100.0)
Platelets: 164 10*3/uL (ref 150–400)
RBC: 4.68 MIL/uL (ref 4.22–5.81)
RDW: 14 % (ref 11.5–15.5)
WBC: 8.7 10*3/uL (ref 4.0–10.5)
nRBC: 0 % (ref 0.0–0.2)

## 2022-03-25 LAB — GLUCOSE, CAPILLARY: Glucose-Capillary: 126 mg/dL — ABNORMAL HIGH (ref 70–99)

## 2022-03-25 NOTE — Hospital Course (Signed)
I have a renal mass.  HPI: [name] is a 82 year-old male established patient who is here for a renal mass.    [name] returns today in f/u. He had a CT hematuria study for his history of bladder cancer with hematuria and was found to have a filling defect in the RUP that is concerning for a neoplasm. He is scheduled for ureteroscopy on 03/27/22. He has a 2 year history of some right flank pain. He has 3-10 RBC's but no gross hematuria. He has a history of a membranous stricture and an IPSS of 24 with a weak stream and a sensation of incomplete emptying.      ALLERGIES: No Allergies    MEDICATIONS: Aspirin 1 PO Daily  Doxycycline Hyclate 100 mg tablet  Hydrochlorothiazide  Omeprazole 1 PO Daily  Plavix 75 mg tablet  Acetaminophen  Amlodipine Besilate  Bengay Greaseless  Bentyl  Bystolic 1 PO Daily  Cymbalta  Fiber  Flonase Allergy Relief  Folic Acid 1 PO Daily  Isosorbide 1 PO Daily  Isosorbide  Lodine 1 PO Daily  Mucinex  Nitroglycerin 1 PO Daily  Opcon-A  Oxycodone-Acetaminophen 5 mg-325 mg tablet 1 tablet PO Daily  Probiotic  Ropinirole Hcl 2 mg tablet 1 tablet PO Daily  Rosuvastatin Calcium  Vitamin B12 1 PO Daily  Zetia     GU PSH: Catheterization For Collection Of Specimen, Single Patient, All Places Of Service - 2017 Catheterize For Residual - 2017 Cysto Bladder Ureth Biopsy - 2008 Cysto Dilate Stricture (M or F) - 08/11/2021, 07/31/2020, 2021, 2017 Cysto Uretero Biopsy Fulgura - 2010 Cystoscopy Insert Stent - 2010, 2010 Cystoscopy TURBT <2 cm - 2009 Cystoscopy Ureteroscopy - 2010 Locm 300-'399Mg'$ /Ml Iodine,1Ml - 03/04/2022       PSH Notes: Heart Surgery  Knee Surgery   Nasal surgery 2023   NON-GU PSH: Back surgery CABG (coronary artery bypass grafting) - about 2003 Cardiac Stent Placement - about 2021 Cholecystectomy (laparoscopic)     GU PMH: History of bladder cancer - 03/04/2022, No recurrences seen. Cystoscopy in 1 year. , - 08/11/2021, -  02/13/2021, He has no recurrent tumors. f/u in 1 year for cystoscopy., - 07/31/2020, cystoscopy at f/u. He has had no hematuria. , - 2022, History of bladder cancer, - 2017, Personal history of bladder cancer, - 2016 Microscopic hematuria - 03/04/2022, - 01/30/2022 Membranous urethral stricture, He will be due for cystoscopy at time of next exam in 6 months. - 01/30/2022, The stricture recurred and was dilated successfully. f/u in 6 months. , - 08/11/2021, - 02/13/2021, He has a recurrent stricture which I dilated. f/u in 6 months for a flowrat.e , - 07/31/2020, - 2022, The stricture had recurred and was dilated. he will return in 6 months with a flowrate. , - 2021 Urinary Urgency - 01/30/2022, - 08/11/2021, - 2022 Weak Urinary Stream - 01/30/2022, - 08/11/2021, - 02/13/2021, - 2022, Weak urinary stream, - 2017 Pelvic/perineal pain, He has some chronic discomfort with urgency. - 08/11/2021, He has perineal/anal pain but a negative exam. I discussed consideration of PT but he wants to wait until after he has surgery in March on the gallbladder. , - 2022 BPH w/LUTS - 02/13/2021, He has stable LUTS with no further decrease in the stream. I will have him return in 12month for a cystoscopy and cytology and will dilate the urethra if indicated. , - 2022, Benign prostatic hyperplasia with urinary obstruction, - 2017 Epididymo-orchitis - 2017 Other microscopic hematuria - 2017 Urethral Stricture, Unspec,  Urethral stricture - 2017 Oth GU systems Signs/Symptoms, Bladder pain - 2016 Urinary Frequency, Urinary frequency - 2015 ED due to arterial insufficiency, Erectile dysfunction due to arterial insufficiency - 2014 Hematuria, Unspec, Hematuria - 2014 Inflammatory Disease Prostate, Unspec, Prostatitis - 2014 Peyronies Disease, Peyronie's disease - 2014 Ureter, Unspecified, Neoplasm of uncertain behavior, Neoplasm of uncertain behavior of ureter, unspecified laterality - 2014 Urinary Tract Inf, Unspec site, Urinary tract  infection - 2014 Chronic kidney disease stage 3 (GFR 30-60)      PMH Notes:  Past Gu Hx:  [name] presents today for a 28-monthfollow-up. He has a very complicated history and has been a longstanding patient of Dr. PElam Dutch We spent considerable time reviewing some of his old notes. The main issue has been transitional cell carcinoma. This seems to date back to 1995 and he has had 7-8 recurrences of transitional cell carcinoma. It appears that the tumors have been high grade but always noninvasive. He has had 2-3 courses of BCG therapy. His last BCG appears to have been in 2009. That is the last time he has had a documented tumor. In March of 2009 he underwent a resection by Dr. DDiona Fantiwhich revealed a noninvasive high-grade papillary carcinoma. The patient had fairly routine FISH testing, but the last was done in November of 2010 and was negative. His last cytology was in May of 2011 and that was also negative. The patient's last follow-up here was May of 2012. He has had chronic bulbar urethral stricture issues and has required dilation before every cystoscopy. He does have considerable voiding symptoms with both obstructive and irritative symptoms. He does have hesitancy with a weak stream. No pain with voiding, no hematuria recent. The patient also has a long history of organic erectile dysfunction. He had a penile implant and is still in situ but does not function.   He f/u in April of this year. His stream has slowed down. He thought about doing some intermittent catheter to try to keep his stricture open but he ended up abandoning that. Urinalysis today is unremarkable. He has no other concerns. He remains on tamsulosin. Overall voiding is relatively acceptable weak urinary stream. He has had no gross hematuria. He is currently 82years of age.   Cystoscopy was performed and his stricture re-dilated. There was no evidence of tumor recurrence on exam.    June 2017: Since that April  visit, pt has complained of constant pain in his urethra extending through the perineum and into the rectum. He also endorses worsening stream and feeling of incomplete emptying.   He was seen by his PCP last friday for the same complaint and rx'd flomax and bactrim. Review of those notes also indicate interval fevers. C/s showed mixed colonies >100,00 k. Pt stopped both medications when gross hematuria developed a few days ago.     NON-GU PMH: Encounter for general adult medical examination without abnormal findings, Encounter for preventive health examination - 2017 Asthma, Asthma - 2014 Personal history of other diseases of the circulatory system, History of cardiac disorder - 2014 Personal history of other diseases of the nervous system and sense organs, History of sleep apnea - 2014 Personal history of other specified conditions, History of heartburn - 2014 Anxiety Arthritis Coronary Artery Disease Depression GERD Hypercholesterolemia Hypertension Myocardial Infarction Sleep Apnea    FAMILY HISTORY: Cancer - Mother Death of family member - [place] R[place] In Family Heart Disease - Runs In Family  stroke - Father   SOCIAL HISTORY: Marital Status: Widowed Preferred Language: English; Ethnicity: Not Hispanic Or Latino; Race: White Current Smoking Status: Patient does not smoke anymore. Has not smoked since 08/20/1987. Smoked for 15 years.  Has never drank.  Drinks 2 caffeinated drinks per day.     Notes: 3 daughters   REVIEW OF SYSTEMS:    GU Review Male:   Patient denies frequent urination, hard to postpone urination, burning/ pain with urination, get up at night to urinate, leakage of urine, stream starts and stops, trouble starting your stream, have to strain to urinate , erection problems, and penile pain.  Gastrointestinal (Upper):   Patient denies nausea, vomiting, and indigestion/ heartburn.  Gastrointestinal (Lower):   Patient denies  diarrhea and constipation.  Constitutional:   Patient denies fever, night sweats, weight loss, and fatigue.  Skin:   Patient denies skin rash/ lesion and itching.  Eyes:   Patient denies blurred vision and double vision.  Ears/ Nose/ Throat:   Patient denies sore throat and sinus problems.  Hematologic/Lymphatic:   Patient denies swollen glands and easy bruising.  Cardiovascular:   Patient denies leg swelling and chest pains.  Respiratory:   Patient reports shortness of breath. Patient denies cough.  Endocrine:   Patient denies excessive thirst.  Musculoskeletal:   Patient denies back pain and joint pain.  Neurological:   Patient denies headaches and dizziness.  Psychologic:   Patient denies depression and anxiety.   Notes: HE has a sensation of incomplete emptying and a weak stream.     VITAL SIGNS:      03/23/2022 10:34 AM  Weight 171 lb / 77.56 kg  Height 69 in / 175.26 cm  BP 119/61 mmHg  Heart Rate 56 /min  Temperature 98.0 F / 36.6 C  BMI 25.2 kg/m   MULTI-SYSTEM PHYSICAL EXAMINATION:    Constitutional: Well-nourished. No physical deformities. Normally developed. Good grooming.  Respiratory: Normal breath sounds. No labored breathing, no use of accessory muscles.   Cardiovascular: Bradycardia with Regular rhythm. No murmur, no gallop.      Complexity of Data:  Records Review:   AUA Symptom Score, Previous Patient Records  Urine Test Review:   Urinalysis  X-Ray Review: C.T. Hematuria: Reviewed Films. Reviewed Report. Discussed With Patient. filling defect in the RUP.     12/17/10 12/05/09 12/05/08 12/01/07 06/16/06 03/06/05 02/27/04 10/11/02  PSA  Total PSA 0.37  0.32  0.43  0.86  0.63  0.64  1.25  0.96     PROCEDURES:          Visit Complexity - G2211 Chronic management of his history of microhematuria and bladder cancer.          Urinalysis w/Scope Dipstick Dipstick Cont'd Micro  Color: Yellow Bilirubin: Neg mg/dL WBC/hpf: 0 - 5/hpf  Appearance: Clear  Ketones: Neg mg/dL RBC/hpf: 3 - 10/hpf  Specific Gravity: 1.020 Blood: 2+ ery/uL Bacteria: Few (10-25/hpf)  pH: 5.5 Protein: 2+ mg/dL Cystals: NS (Not Seen)  Glucose: Neg mg/dL Urobilinogen: 0.2 mg/dL Casts: Hyaline    Nitrites: Neg Trichomonas: Not Present    Leukocyte Esterase: Neg leu/uL Mucous: Present      Epithelial Cells: 0 - 5/hpf      Yeast: NS (Not Seen)      Sperm: Not Present    Notes: Unconcentrated microscopic exam due to qns    ASSESSMENT:      ICD-10 Details  1 GU:   Microscopic hematuria - R31.21 Minor - He has microhematuria  and on CT there is a filling defect in the upper pole on the right that is concerning for a urothelial neoplasm. He is scheduled for ureteroscopy on Friday and I reviewed the risks of the procedure including bleeding, infection, injury to the urinary tract, need for a stent and secondary procedures, thrombotic events and anesthetic complications. He is off of his plavix.   2   Abnormal radiologic findings on diagnostic imaging of renal pelvis, ureter, or bladder - R93.41 Undiagnosed New Problem  3   History of bladder cancer - Z85.51 Chronic, Stable - No bladder lesions noted on CT.    PLAN:           Schedule Return Visit/Planned Activity: Keep Scheduled Appointment - Office Visit  Procedure: 03/27/2022 - Cysto Uretero Biopsy Fulgura - [phone], right          Document Letter(s):  Created for Patient: Clinical Summary         Notes:   CC: Dr. [name].         Next Appointment:      Next Appointment: 03/27/2022 10:00 AM    Appointment Type: Surgery     Location: [facility] [phone]    Provider: Irine Seal, M.D.    Reason for Visit: OP WL CYSTO RT RPG URS W/ BX STENT

## 2022-03-25 NOTE — H&P (Signed)
I have a renal mass.  HPI: [name] is a 82 year-old male established patient who is here for a renal mass.    [name] returns today in f/u. He had a CT hematuria study for his history of bladder cancer with hematuria and was found to have a filling defect in the RUP that is concerning for a neoplasm. He is scheduled for ureteroscopy on 03/27/22. He has a 2 year history of some right flank pain. He has 3-10 RBC's but no gross hematuria. He has a history of a membranous stricture and an IPSS of 24 with a weak stream and a sensation of incomplete emptying.      ALLERGIES: No Allergies    MEDICATIONS: Aspirin 1 PO Daily  Doxycycline Hyclate 100 mg tablet  Hydrochlorothiazide  Omeprazole 1 PO Daily  Plavix 75 mg tablet  Acetaminophen  Amlodipine Besilate  Bengay Greaseless  Bentyl  Bystolic 1 PO Daily  Cymbalta  Fiber  Flonase Allergy Relief  Folic Acid 1 PO Daily  Isosorbide 1 PO Daily  Isosorbide  Lodine 1 PO Daily  Mucinex  Nitroglycerin 1 PO Daily  Opcon-A  Oxycodone-Acetaminophen 5 mg-325 mg tablet 1 tablet PO Daily  Probiotic  Ropinirole Hcl 2 mg tablet 1 tablet PO Daily  Rosuvastatin Calcium  Vitamin B12 1 PO Daily  Zetia     GU PSH: Catheterization For Collection Of Specimen, Single Patient, All Places Of Service - 2017 Catheterize For Residual - 2017 Cysto Bladder Ureth Biopsy - 2008 Cysto Dilate Stricture (M or F) - 08/11/2021, 07/31/2020, 2021, 2017 Cysto Uretero Biopsy Fulgura - 2010 Cystoscopy Insert Stent - 2010, 2010 Cystoscopy TURBT <2 cm - 2009 Cystoscopy Ureteroscopy - 2010 Locm 300-'399Mg'$ /Ml Iodine,1Ml - 03/04/2022       PSH Notes: Heart Surgery  Knee Surgery   Nasal surgery 2023   NON-GU PSH: Back surgery CABG (coronary artery bypass grafting) - about 2003 Cardiac Stent Placement - about 2021 Cholecystectomy (laparoscopic)     GU PMH: History of bladder cancer - 03/04/2022, No recurrences seen. Cystoscopy in 1 year. , - 08/11/2021, -  02/13/2021, He has no recurrent tumors. f/u in 1 year for cystoscopy., - 07/31/2020, cystoscopy at f/u. He has had no hematuria. , - 2022, History of bladder cancer, - 2017, Personal history of bladder cancer, - 2016 Microscopic hematuria - 03/04/2022, - 01/30/2022 Membranous urethral stricture, He will be due for cystoscopy at time of next exam in 6 months. - 01/30/2022, The stricture recurred and was dilated successfully. f/u in 6 months. , - 08/11/2021, - 02/13/2021, He has a recurrent stricture which I dilated. f/u in 6 months for a flowrat.e , - 07/31/2020, - 2022, The stricture had recurred and was dilated. he will return in 6 months with a flowrate. , - 2021 Urinary Urgency - 01/30/2022, - 08/11/2021, - 2022 Weak Urinary Stream - 01/30/2022, - 08/11/2021, - 02/13/2021, - 2022, Weak urinary stream, - 2017 Pelvic/perineal pain, He has some chronic discomfort with urgency. - 08/11/2021, He has perineal/anal pain but a negative exam. I discussed consideration of PT but he wants to wait until after he has surgery in March on the gallbladder. , - 2022 BPH w/LUTS - 02/13/2021, He has stable LUTS with no further decrease in the stream. I will have him return in 13month for a cystoscopy and cytology and will dilate the urethra if indicated. , - 2022, Benign prostatic hyperplasia with urinary obstruction, - 2017 Epididymo-orchitis - 2017 Other microscopic hematuria - 2017 Urethral Stricture, Unspec,  Urethral stricture - 2017 Oth GU systems Signs/Symptoms, Bladder pain - 2016 Urinary Frequency, Urinary frequency - 2015 ED due to arterial insufficiency, Erectile dysfunction due to arterial insufficiency - 2014 Hematuria, Unspec, Hematuria - 2014 Inflammatory Disease Prostate, Unspec, Prostatitis - 2014 Peyronies Disease, Peyronie's disease - 2014 Ureter, Unspecified, Neoplasm of uncertain behavior, Neoplasm of uncertain behavior of ureter, unspecified laterality - 2014 Urinary Tract Inf, Unspec site, Urinary tract  infection - 2014 Chronic kidney disease stage 3 (GFR 30-60)      PMH Notes:  Past Gu Hx:  [name] presents today for a 67-monthfollow-up. He has a very complicated history and has been a longstanding patient of Dr. PElam Dutch We spent considerable time reviewing some of his old notes. The main issue has been transitional cell carcinoma. This seems to date back to 1995 and he has had 7-8 recurrences of transitional cell carcinoma. It appears that the tumors have been high grade but always noninvasive. He has had 2-3 courses of BCG therapy. His last BCG appears to have been in 2009. That is the last time he has had a documented tumor. In March of 2009 he underwent a resection by Dr. DDiona Fantiwhich revealed a noninvasive high-grade papillary carcinoma. The patient had fairly routine FISH testing, but the last was done in November of 2010 and was negative. His last cytology was in May of 2011 and that was also negative. The patient's last follow-up here was May of 2012. He has had chronic bulbar urethral stricture issues and has required dilation before every cystoscopy. He does have considerable voiding symptoms with both obstructive and irritative symptoms. He does have hesitancy with a weak stream. No pain with voiding, no hematuria recent. The patient also has a long history of organic erectile dysfunction. He had a penile implant and is still in situ but does not function.   He f/u in April of this year. His stream has slowed down. He thought about doing some intermittent catheter to try to keep his stricture open but he ended up abandoning that. Urinalysis today is unremarkable. He has no other concerns. He remains on tamsulosin. Overall voiding is relatively acceptable weak urinary stream. He has had no gross hematuria. He is currently 82years of age.   Cystoscopy was performed and his stricture re-dilated. There was no evidence of tumor recurrence on exam.    June 2017: Since that April  visit, pt has complained of constant pain in his urethra extending through the perineum and into the rectum. He also endorses worsening stream and feeling of incomplete emptying.   He was seen by his PCP last friday for the same complaint and rx'd flomax and bactrim. Review of those notes also indicate interval fevers. C/s showed mixed colonies >100,00 k. Pt stopped both medications when gross hematuria developed a few days ago.     NON-GU PMH: Encounter for general adult medical examination without abnormal findings, Encounter for preventive health examination - 2017 Asthma, Asthma - 2014 Personal history of other diseases of the circulatory system, History of cardiac disorder - 2014 Personal history of other diseases of the nervous system and sense organs, History of sleep apnea - 2014 Personal history of other specified conditions, History of heartburn - 2014 Anxiety Arthritis Coronary Artery Disease Depression GERD Hypercholesterolemia Hypertension Myocardial Infarction Sleep Apnea    FAMILY HISTORY: Cancer - Mother Death of family member - [place] Runs In Family Heart Disease - Runs In Family  stroke - Father   SOCIAL HISTORY: Marital Status: Widowed Preferred Language: English; Ethnicity: Not Hispanic Or Latino; Race: White Current Smoking Status: Patient does not smoke anymore. Has not smoked since 08/20/1987. Smoked for 15 years.  Has never drank.  Drinks 2 caffeinated drinks per day.     Notes: 3 daughters   REVIEW OF SYSTEMS:    GU Review Male:   Patient denies frequent urination, hard to postpone urination, burning/ pain with urination, get up at night to urinate, leakage of urine, stream starts and stops, trouble starting your stream, have to strain to urinate , erection problems, and penile pain.  Gastrointestinal (Upper):   Patient denies nausea, vomiting, and indigestion/ heartburn.  Gastrointestinal (Lower):   Patient denies  diarrhea and constipation.  Constitutional:   Patient denies fever, night sweats, weight loss, and fatigue.  Skin:   Patient denies skin rash/ lesion and itching.  Eyes:   Patient denies blurred vision and double vision.  Ears/ Nose/ Throat:   Patient denies sore throat and sinus problems.  Hematologic/Lymphatic:   Patient denies swollen glands and easy bruising.  Cardiovascular:   Patient denies leg swelling and chest pains.  Respiratory:   Patient reports shortness of breath. Patient denies cough.  Endocrine:   Patient denies excessive thirst.  Musculoskeletal:   Patient denies back pain and joint pain.  Neurological:   Patient denies headaches and dizziness.  Psychologic:   Patient denies depression and anxiety.   Notes: HE has a sensation of incomplete emptying and a weak stream.     VITAL SIGNS:      03/23/2022 10:34 AM  Weight 171 lb / 77.56 kg  Height 69 in / 175.26 cm  BP 119/61 mmHg  Heart Rate 56 /min  Temperature 98.0 F / 36.6 C  BMI 25.2 kg/m   MULTI-SYSTEM PHYSICAL EXAMINATION:    Constitutional: Well-nourished. No physical deformities. Normally developed. Good grooming.  Respiratory: Normal breath sounds. No labored breathing, no use of accessory muscles.   Cardiovascular: Bradycardia with Regular rhythm. No murmur, no gallop.      Complexity of Data:  Records Review:   AUA Symptom Score, Previous Patient Records  Urine Test Review:   Urinalysis  X-Ray Review: C.T. Hematuria: Reviewed Films. Reviewed Report. Discussed With Patient. filling defect in the RUP.     12/17/10 12/05/09 12/05/08 12/01/07 06/16/06 03/06/05 02/27/04 10/11/02  PSA  Total PSA 0.37  0.32  0.43  0.86  0.63  0.64  1.25  0.96     PROCEDURES:          Visit Complexity - G2211 Chronic management of his history of microhematuria and bladder cancer.          Urinalysis w/Scope Dipstick Dipstick Cont'd Micro  Color: Yellow Bilirubin: Neg mg/dL WBC/hpf: 0 - 5/hpf  Appearance: Clear  Ketones: Neg mg/dL RBC/hpf: 3 - 10/hpf  Specific Gravity: 1.020 Blood: 2+ ery/uL Bacteria: Few (10-25/hpf)  pH: 5.5 Protein: 2+ mg/dL Cystals: NS (Not Seen)  Glucose: Neg mg/dL Urobilinogen: 0.2 mg/dL Casts: Hyaline    Nitrites: Neg Trichomonas: Not Present    Leukocyte Esterase: Neg leu/uL Mucous: Present      Epithelial Cells: 0 - 5/hpf      Yeast: NS (Not Seen)      Sperm: Not Present    Notes: Unconcentrated microscopic exam due to qns    ASSESSMENT:      ICD-10 Details  1 GU:   Microscopic hematuria - R31.21 Minor - He has microhematuria  and on CT there is a filling defect in the upper pole on the right that is concerning for a urothelial neoplasm. He is scheduled for ureteroscopy on Friday and I reviewed the risks of the procedure including bleeding, infection, injury to the urinary tract, need for a stent and secondary procedures, thrombotic events and anesthetic complications. He is off of his plavix.   2   Abnormal radiologic findings on diagnostic imaging of renal pelvis, ureter, or bladder - R93.41 Undiagnosed New Problem  3   History of bladder cancer - Z85.51 Chronic, Stable - No bladder lesions noted on CT.    PLAN:           Schedule Return Visit/Planned Activity: Keep Scheduled Appointment - Office Visit  Procedure: 03/27/2022 - Cysto Uretero Biopsy Fulgura - [phone], right          Document Letter(s):  Created for Patient: Clinical Summary         Notes:   CC: Dr. [name].         Next Appointment:      Next Appointment: 03/27/2022 10:00 AM    Appointment Type: Surgery     Location: [facility] [phone]    Provider: Irine Seal, M.D.    Reason for Visit: OP WL CYSTO RT RPG URS W/ BX STENT

## 2022-03-26 NOTE — Progress Notes (Signed)
Anesthesia Chart Review   Case: [id number] Date/Time: 03/27/22 0945   Procedures:      CYSTOSCOPY WITH RIGHT RETROGRADE PYELOGRAM, URETEROSCOPY AND STENT PLACEMENT (Right)     CYSTOSCOPY WITH BIOPSY   Anesthesia type: General   Pre-op diagnosis: RIGHT RENAL UROTHELIAL NEOPLASM   Location: [place] [facility]   Surgeons: Irine Seal, MD       DISCUSSION:82 y.o. former smoker with h/o HTN, COPD, sleep apnea, CAD (CABG 2001, PCI 2020), DM II, right renal urothelial neoplasm scheduled for above procedure 03/27/2022 with Dr. Irine Seal.   Pt last seen by cardiology 03/19/2022. Per OV note, "[name]'s perioperative risk of a major cardiac event is 0.9% according to the Revised Cardiac Risk Index (RCRI).  Therefore, he is at low risk for perioperative complications.   His functional capacity is good at 5.29 METs according to the Duke Activity Status Index (DASI). Recommendations: According to ACC/AHA guidelines, no further cardiovascular testing needed.  The patient may proceed to surgery at acceptable risk.   Antiplatelet and/or Anticoagulation Recommendations: Clopidogrel (Plavix) can be held for 5 days prior to his surgery and resumed as soon as possible post op."  Anticipate pt can proceed with planned procedure barring acute status change.   VS: BP (!) 144/65   Pulse (!) 59   Temp 36.8 C (Oral)   Resp 20   Ht '5\' 9"'$  (1.753 m)   Wt 75.8 kg   SpO2 91%   BMI 24.66 kg/m   PROVIDERS: Isaac Bliss, Rayford Halsted, MD is PCP   [name], MD is Cardiologist  LABS: Labs reviewed: Acceptable for surgery. (all labs ordered are listed, but only abnormal results are displayed)  Labs Reviewed  BASIC METABOLIC PANEL - Abnormal; Notable for the following components:      Result Value   Glucose, Bld 126 (*)    Creatinine, Ser 1.45 (*)    GFR, Estimated 49 (*)    All other components within normal limits  GLUCOSE, CAPILLARY - Abnormal; Notable for the following components:    Glucose-Capillary 126 (*)    All other components within normal limits  CBC     IMAGES:   EKG:   CV: Echo 04/10/19 1. Left ventricular ejection fraction, by estimation, is 50 to 55%. The  left ventricle has low normal function. The left ventricle has no regional  wall motion abnormalities. Left ventricular diastolic parameters are  consistent with Grade II diastolic  dysfunction (pseudonormalization).   2. Right ventricular systolic function is normal. The right ventricular  size is normal. There is moderately elevated pulmonary artery systolic  pressure.   3. Left atrial size was moderately dilated.   4. The mitral valve is normal in structure. Trivial mitral valve  regurgitation. No evidence of mitral stenosis.   5. The aortic valve is grossly normal. Aortic valve regurgitation is  trivial. No aortic stenosis is present.   Myocardial Perfusion 01/04/2018 The left ventricular ejection fraction is mildly decreased (45-54%). Nuclear stress EF: 51%. This is a low risk study.   Normal resting and stress perfusion. No ischemia or infarction EF Estimated at 51% apex appears hypokinetic RV appears prominent  Past Medical History:  Diagnosis Date   Allergy    Anxiety    B12 deficiency anemia    Blood transfusion without reported diagnosis    CAD (coronary artery disease)    Cancer (HCC)    bladder-    Colon polyps    COPD (chronic obstructive pulmonary  disease) ([place])    Depression    Diabetes mellitus ([place])    Dyspnea    Esophagus, Barrett's    GERD (gastroesophageal reflux disease)    Headache    History of bladder cancer    Bladder cancer "8 times"   History of hiatal hernia    Hyperlipidemia    Hypertension    Localized osteoarthrosis, lower leg    Myocardial infarction ([place]) 2021   Pneumonia    Restless leg syndrome    Sleep apnea    does not wear cpap   Stenosis of esophagus     Past Surgical History:  Procedure Laterality Date   BILIARY BRUSHING   04/01/2018   Procedure: BILIARY BRUSHING;  Surgeon: Irving Copas., MD;  Location: [facility];  Service: Gastroenterology;;   BILIARY BRUSHING  09/12/2018   Procedure: BILIARY BRUSHING;  Surgeon: Irving Copas., MD;  Location: [place];  Service: Gastroenterology;;   BILIARY BRUSHING  11/28/2018   Procedure: BILIARY BRUSHING;  Surgeon: Irving Copas., MD;  Location: [place];  Service: Gastroenterology;;   BILIARY BRUSHING  03/11/2020   Procedure: BILIARY BRUSHING;  Surgeon: Irving Copas., MD;  Location: [facility];  Service: Gastroenterology;;   BILIARY DILATION  09/12/2018   Procedure: BILIARY DILATION;  Surgeon: Irving Copas., MD;  Location: [place];  Service: Gastroenterology;;   BILIARY DILATION  11/28/2018   Procedure: BILIARY DILATION;  Surgeon: Irving Copas., MD;  Location: [place];  Service: Gastroenterology;;   BILIARY DILATION  03/11/2020   Procedure: BILIARY DILATION;  Surgeon: Irving Copas., MD;  Location: [facility];  Service: Gastroenterology;;   BILIARY STENT PLACEMENT  04/01/2018   Procedure: BILIARY STENT PLACEMENT;  Surgeon: Irving Copas., MD;  Location: [place];  Service: Gastroenterology;;   BILIARY STENT PLACEMENT  09/12/2018   Procedure: BILIARY STENT PLACEMENT;  Surgeon: Irving Copas., MD;  Location: [place];  Service: Gastroenterology;;   BILIARY STENT PLACEMENT  11/28/2018   Procedure: BILIARY STENT PLACEMENT;  Surgeon: Irving Copas., MD;  Location: [place];  Service: Gastroenterology;;   BIOPSY  04/01/2018   Procedure: BIOPSY;  Surgeon: Irving Copas., MD;  Location: [place];  Service: Gastroenterology;;   BIOPSY  09/12/2018   Procedure: BIOPSY;  Surgeon: Irving Copas., MD;  Location: [place];  Service: Gastroenterology;;   BIOPSY  11/28/2018   Procedure: BIOPSY;  Surgeon: Irving Copas., MD;   Location: [place];  Service: Gastroenterology;;   BIOPSY  03/11/2020   Procedure: BIOPSY;  Surgeon: Irving Copas., MD;  Location: [facility];  Service: Gastroenterology;;   bladder cancer      x 8 cystoscopy   CERVICAL DISCECTOMY     ACDF   CHOLECYSTECTOMY     COLONOSCOPY  11/17/2005   normal    CORONARY ARTERY BYPASS GRAFT     x4   CORONARY STENT INTERVENTION N/A 01/05/2019   Procedure: CORONARY STENT INTERVENTION;  Surgeon: Nelva Bush, MD;  Location: [place] [facility];  Service: Cardiovascular;  Laterality: N/A;   ENDOSCOPIC MUCOSAL RESECTION  09/12/2018   Procedure: ENDOSCOPIC MUCOSAL RESECTION;  Surgeon: Rush Landmark, Telford Nab., MD;  Location: [facility] ENDOSCOPY;  Service: Gastroenterology;;   ENDOSCOPIC RETROGRADE CHOLANGIOPANCREATOGRAPHY (ERCP) WITH PROPOFOL N/A 04/01/2018   Procedure: ENDOSCOPIC RETROGRADE CHOLANGIOPANCREATOGRAPHY (ERCP) WITH PROPOFOL;  Surgeon: Irving Copas., MD;  Location: [place];  Service: Gastroenterology;  Laterality: N/A;   ENDOSCOPIC RETROGRADE CHOLANGIOPANCREATOGRAPHY (ERCP) WITH PROPOFOL N/A 09/12/2018   Procedure: ENDOSCOPIC RETROGRADE CHOLANGIOPANCREATOGRAPHY (  ERCP) WITH PROPOFOL;  Surgeon: Mansouraty, Telford Nab., MD;  Location: [place];  Service: Gastroenterology;  Laterality: N/A;   ENDOSCOPIC RETROGRADE CHOLANGIOPANCREATOGRAPHY (ERCP) WITH PROPOFOL N/A 03/11/2020   Procedure: ENDOSCOPIC RETROGRADE CHOLANGIOPANCREATOGRAPHY (ERCP) WITH PROPOFOL;  Surgeon: Rush Landmark Telford Nab., MD;  [facility];  Service: Gastroenterology;  Laterality: N/A;   ENDOSCOPIC RETROGRADE CHOLANGIOPANCREATOGRAPHY (ERCP) WITH PROPOFOL N/A 06/03/2020   Procedure: ENDOSCOPIC RETROGRADE CHOLANGIOPANCREATOGRAPHY (ERCP) WITH PROPOFOL;  Surgeon: Rush Landmark Telford Nab., MD;  Location[facility];  Service: Gastroenterology;  Laterality: N/A;   ERCP N/A 11/28/2018   Procedure: ENDOSCOPIC RETROGRADE CHOLANGIOPANCREATOGRAPHY (ERCP) +EGD  with spyglass;  Surgeon: Rush Landmark Telford Nab., MD;  Location: [place];  Service: Gastroenterology;  Laterality: N/A;   ESOPHAGOGASTRODUODENOSCOPY  04/29/2010   ESOPHAGOGASTRODUODENOSCOPY (EGD) WITH PROPOFOL N/A 04/01/2018   Procedure: ESOPHAGOGASTRODUODENOSCOPY (EGD) WITH PROPOFOL;  Surgeon: Rush Landmark Telford Nab., MD;  Location: [place];  Service: Gastroenterology;  Laterality: N/A;   ESOPHAGOGASTRODUODENOSCOPY (EGD) WITH PROPOFOL N/A 09/12/2018   Procedure: ESOPHAGOGASTRODUODENOSCOPY (EGD) WITH PROPOFOL;  Surgeon: Rush Landmark Telford Nab., MD;  Location: [place];  Service: Gastroenterology;  Laterality: N/A;   ESOPHAGOGASTRODUODENOSCOPY (EGD) WITH PROPOFOL N/A 11/28/2018   Procedure: ESOPHAGOGASTRODUODENOSCOPY (EGD) WITH PROPOFOL;  Surgeon: Rush Landmark Telford Nab., MD;  Location: [place];  Service: Gastroenterology;  Laterality: N/A;   ESOPHAGOGASTRODUODENOSCOPY (EGD) WITH PROPOFOL N/A 03/11/2020   Procedure: ESOPHAGOGASTRODUODENOSCOPY (EGD) WITH PROPOFOL;  Surgeon: Rush Landmark Telford Nab., MD;  Location[facility];  Service: Gastroenterology;  Laterality: N/A;   ESOPHAGOGASTRODUODENOSCOPY (EGD) WITH PROPOFOL N/A 06/03/2020   Procedure: ESOPHAGOGASTRODUODENOSCOPY (EGD) WITH PROPOFOL;  Surgeon: Rush Landmark Telford Nab., MD;  Location[facility];  Service: Gastroenterology;  Laterality: N/A;   EUS  04/01/2018   Procedure: FULL UPPER ENDOSCOPIC ULTRASOUND (EUS) RADIAL;  Surgeon: Irving Copas., MD;  Location: [place];  Service: Gastroenterology;;   EUS N/A 09/12/2018   Procedure: UPPER ENDOSCOPIC ULTRASOUND (EUS) RADIAL;  Surgeon: Irving Copas., MD;  Location: [place];  Service: Gastroenterology;  Laterality: N/A;   FINE NEEDLE ASPIRATION  09/12/2018   Procedure: FINE NEEDLE ASPIRATION (FNA) LINEAR;  Surgeon: Irving Copas., MD;  Location: [place];  Service: Gastroenterology;;   HAND SURGERY Left 2018   saw accident    HEMOSTASIS CLIP PLACEMENT  09/12/2018   Procedure: HEMOSTASIS CLIP PLACEMENT;  Surgeon: Irving Copas., MD;  Location: [place];  Service: Gastroenterology;;   HEMOSTASIS CLIP PLACEMENT  06/03/2020   Procedure: HEMOSTASIS CLIP PLACEMENT;  Surgeon: Irving Copas., MD;  Location: [facility];  Service: Gastroenterology;;   I & D EXTREMITY Left 11/24/2016   Procedure: IRRIGATION AND DEBRIDEMENT LEFT HAND, THUMB, INDEX, MIDDLE, RING, AND SMALL FINGERS WITH RECONSTRUCTION;  Surgeon: Roseanne Kaufman, MD;  Location: [place];  Service: Orthopedics;  Laterality: Left;   KNEE ARTHROSCOPY Left    LEFT HEART CATH AND CORS/GRAFTS ANGIOGRAPHY N/A 01/05/2019   Procedure: LEFT HEART CATH AND CORS/GRAFTS ANGIOGRAPHY;  Surgeon: Nelva Bush, MD;  Location: [place] [facility];  Service: Cardiovascular;  Laterality: N/A;   LUMBAR LAMINECTOMY     and fusion x 2   NASAL SINUS SURGERY     POPLITEAL SYNOVIAL CYST EXCISION     REMOVAL OF STONES  04/01/2018   Procedure: REMOVAL OF STONES;  Surgeon: Rush Landmark Telford Nab., MD;  Location: [place];  Service: Gastroenterology;;   REMOVAL OF STONES  09/12/2018   Procedure: REMOVAL OF STONES;  Surgeon: Irving Copas., MD;  Location: [place];  Service: Gastroenterology;;   REMOVAL OF STONES  11/28/2018   Procedure: REMOVAL  OF STONES;  Surgeon: Mansouraty, Telford Nab., MD;  Location: [place];  Service: Gastroenterology;;   REMOVAL OF STONES  03/11/2020   Procedure: REMOVAL OF STONES;  Surgeon: Irving Copas., MD;  Location: [facility];  Service: Gastroenterology;;   REMOVAL OF STONES  06/03/2020   Procedure: REMOVAL OF STONES;  Surgeon: Irving Copas., MD;  Location: [facility];  Service: Gastroenterology;;   Azzie Almas DILATION N/A 09/12/2018   Procedure: Renato Battles;  Surgeon: Irving Copas., MD;  Location: [place];  Service: Gastroenterology;  Laterality: N/A;   SAVORY DILATION N/A  11/28/2018   Procedure: SAVORY DILATION;  Surgeon: Rush Landmark Telford Nab., MD;  Location: [place];  Service: Gastroenterology;  Laterality: N/A;   SEPTOPLASTY Bilateral 05/26/2021   Procedure: SEPTOPLASTY;  Surgeon: Leta Baptist, MD;  Location: [place];  Service: ENT;  Laterality: Bilateral;   SPHINCTEROTOMY  04/01/2018   Procedure: Joan Mayans;  Surgeon: Mansouraty, Telford Nab., MD;  Location: [place];  Service: Gastroenterology;;   Bess Kinds CHOLANGIOSCOPY N/A 11/28/2018   Procedure: [id number] CHOLANGIOSCOPY;  Surgeon: Irving Copas., MD;  Location: [place];  Service: Gastroenterology;  Laterality: N/A;   SPYGLASS CHOLANGIOSCOPY N/A 06/03/2020   Procedure: SPYGLASS CHOLANGIOSCOPY;  Surgeon: Irving Copas., MD;  Location: [facility];  Service: Gastroenterology;  Laterality: N/A;   STENT REMOVAL  09/12/2018   Procedure: STENT REMOVAL;  Surgeon: Irving Copas., MD;  Location: [place];  Service: Gastroenterology;;   Lavell Islam REMOVAL  11/28/2018   Procedure: STENT REMOVAL;  Surgeon: Irving Copas., MD;  Location: [place];  Service: Gastroenterology;;   Lavell Islam REMOVAL  03/11/2020   Procedure: STENT REMOVAL;  Surgeon: Irving Copas., MD;  Location: [facility];  Service: Gastroenterology;;   Lia Foyer LIFTING INJECTION  09/12/2018   Procedure: SUBMUCOSAL LIFTING INJECTION;  Surgeon: Irving Copas., MD;  Location: [place];  Service: Gastroenterology;;    MEDICATIONS:  acetaminophen (TYLENOL) 500 MG tablet   amLODipine (NORVASC) 2.5 MG tablet   aspirin EC 81 MG tablet   cetirizine (ZYRTEC) 10 MG tablet   clopidogrel (PLAVIX) 75 MG tablet   Coenzyme Q10 300 MG CAPS   cyanocobalamin (VITAMIN B12) 1000 MCG/ML injection   ezetimibe (ZETIA) 10 MG tablet   Ferrous Sulfate (IRON PO)   fluticasone (FLONASE) 50 MCG/ACT nasal spray   furosemide (LASIX) 40 MG tablet   isosorbide mononitrate (IMDUR) 60 MG  24 hr tablet   Menthol, Topical Analgesic, (BENGAY EX)   METAMUCIL FIBER PO   Multiple Vitamins-Minerals (MULTIVITAMIN WITH MINERALS) tablet   Naphazoline-Pheniramine (OPCON-A) 0.027-0.315 % SOLN   nebivolol (BYSTOLIC) 10 MG tablet   NEEDLE, DISP, 25 G (B-D DISP NEEDLE 25GX1") 25G X 1" MISC   nitroGLYCERIN (NITROSTAT) 0.4 MG SL tablet   oxyCODONE-acetaminophen (PERCOCET) 10-325 MG tablet   oxyCODONE-acetaminophen (PERCOCET) 10-325 MG tablet   oxyCODONE-acetaminophen (PERCOCET) 10-325 MG tablet   pantoprazole (PROTONIX) 40 MG tablet   Probiotic Product (PROBIOTIC ADVANCED PO)   Probiotic Product (PROBIOTIC GUMMIES PO)   rOPINIRole (REQUIP) 2 MG tablet   rosuvastatin (CRESTOR) 10 MG tablet   venlafaxine (EFFEXOR) 75 MG tablet   No current facility-administered medications for this encounter.     Konrad Felix Ward, PA-C [facility] [phone]

## 2022-03-27 ENCOUNTER — Other Ambulatory Visit: Payer: Self-pay

## 2022-03-27 ENCOUNTER — Encounter (HOSPITAL_COMMUNITY)
Admission: RE | Disposition: A | Discharge status: Home / Self Care | Payer: Self-pay | Source: Ambulatory Visit | Attending: Urology

## 2022-03-27 ENCOUNTER — Ambulatory Visit (HOSPITAL_COMMUNITY): Payer: Medicare Other | Admitting: Physician Assistant

## 2022-03-27 ENCOUNTER — Ambulatory Visit (HOSPITAL_COMMUNITY): Payer: Medicare Other

## 2022-03-27 ENCOUNTER — Encounter (HOSPITAL_COMMUNITY): Payer: Self-pay | Admitting: Urology

## 2022-03-27 ENCOUNTER — Ambulatory Visit (HOSPITAL_BASED_OUTPATIENT_CLINIC_OR_DEPARTMENT_OTHER): Payer: Medicare Other | Admitting: Anesthesiology

## 2022-03-27 ENCOUNTER — Ambulatory Visit (HOSPITAL_COMMUNITY)
Admission: RE | Admit: 2022-03-27 | Discharge: 2022-03-27 | Disposition: A | Discharge status: Home / Self Care | Payer: Medicare Other | Source: Ambulatory Visit | Attending: Urology | Admitting: Urology

## 2022-03-27 DIAGNOSIS — N2889 Other specified disorders of kidney and ureter: Secondary | ICD-10-CM | POA: Diagnosis present

## 2022-03-27 DIAGNOSIS — Z79899 Other long term (current) drug therapy: Secondary | ICD-10-CM | POA: Diagnosis not present

## 2022-03-27 DIAGNOSIS — G4730 Sleep apnea, unspecified: Secondary | ICD-10-CM | POA: Insufficient documentation

## 2022-03-27 DIAGNOSIS — I10 Essential (primary) hypertension: Secondary | ICD-10-CM

## 2022-03-27 DIAGNOSIS — I509 Heart failure, unspecified: Secondary | ICD-10-CM | POA: Insufficient documentation

## 2022-03-27 DIAGNOSIS — I2510 Atherosclerotic heart disease of native coronary artery without angina pectoris: Secondary | ICD-10-CM | POA: Diagnosis not present

## 2022-03-27 DIAGNOSIS — Z8551 Personal history of malignant neoplasm of bladder: Secondary | ICD-10-CM | POA: Diagnosis not present

## 2022-03-27 DIAGNOSIS — K219 Gastro-esophageal reflux disease without esophagitis: Secondary | ICD-10-CM | POA: Insufficient documentation

## 2022-03-27 DIAGNOSIS — K449 Diaphragmatic hernia without obstruction or gangrene: Secondary | ICD-10-CM | POA: Insufficient documentation

## 2022-03-27 DIAGNOSIS — D4959 Neoplasm of unspecified behavior of other genitourinary organ: Secondary | ICD-10-CM | POA: Diagnosis not present

## 2022-03-27 DIAGNOSIS — I252 Old myocardial infarction: Secondary | ICD-10-CM

## 2022-03-27 DIAGNOSIS — I110 Hypertensive heart disease with heart failure: Secondary | ICD-10-CM

## 2022-03-27 DIAGNOSIS — N35912 Unspecified bulbous urethral stricture, male: Secondary | ICD-10-CM | POA: Insufficient documentation

## 2022-03-27 DIAGNOSIS — J449 Chronic obstructive pulmonary disease, unspecified: Secondary | ICD-10-CM | POA: Insufficient documentation

## 2022-03-27 DIAGNOSIS — M1990 Unspecified osteoarthritis, unspecified site: Secondary | ICD-10-CM | POA: Diagnosis not present

## 2022-03-27 DIAGNOSIS — Z87891 Personal history of nicotine dependence: Secondary | ICD-10-CM | POA: Diagnosis not present

## 2022-03-27 DIAGNOSIS — C641 Malignant neoplasm of right kidney, except renal pelvis: Secondary | ICD-10-CM | POA: Diagnosis not present

## 2022-03-27 DIAGNOSIS — D649 Anemia, unspecified: Secondary | ICD-10-CM

## 2022-03-27 DIAGNOSIS — F418 Other specified anxiety disorders: Secondary | ICD-10-CM | POA: Diagnosis not present

## 2022-03-27 DIAGNOSIS — Z8249 Family history of ischemic heart disease and other diseases of the circulatory system: Secondary | ICD-10-CM | POA: Diagnosis not present

## 2022-03-27 DIAGNOSIS — E119 Type 2 diabetes mellitus without complications: Secondary | ICD-10-CM

## 2022-03-27 DIAGNOSIS — C651 Malignant neoplasm of right renal pelvis: Secondary | ICD-10-CM | POA: Diagnosis not present

## 2022-03-27 HISTORY — PX: CYSTOSCOPY WITH RETROGRADE PYELOGRAM, URETEROSCOPY AND STENT PLACEMENT: SHX5789

## 2022-03-27 HISTORY — PX: CYSTOSCOPY WITH BIOPSY: SHX5122

## 2022-03-27 LAB — GLUCOSE, CAPILLARY
Glucose-Capillary: 139 mg/dL — ABNORMAL HIGH (ref 70–99)
Glucose-Capillary: 118 mg/dL — ABNORMAL HIGH (ref 70–99)

## 2022-03-27 SURGERY — CYSTOURETEROSCOPY, WITH RETROGRADE PYELOGRAM AND STENT INSERTION
Anesthesia: General | Site: Ureter | Laterality: Right

## 2022-03-27 MED ORDER — ACETAMINOPHEN 325 MG PO TABS: 650.0000 mg | ORAL_TABLET | ORAL | Status: DC | PRN

## 2022-03-27 MED ORDER — LIDOCAINE HCL (CARDIAC) PF 100 MG/5ML IV SOSY
PREFILLED_SYRINGE | INTRAVENOUS | Status: DC | PRN
  Administered 2022-03-27: 60 mg via INTRAVENOUS

## 2022-03-27 MED ORDER — DEXAMETHASONE SODIUM PHOSPHATE 10 MG/ML IJ SOLN
INTRAMUSCULAR | Status: DC | PRN
  Administered 2022-03-27: 4 mg via INTRAVENOUS

## 2022-03-27 MED ORDER — 0.9 % SODIUM CHLORIDE (POUR BTL) OPTIME
TOPICAL | Status: DC | PRN
  Administered 2022-03-27: 1000 mL

## 2022-03-27 MED ORDER — LACTATED RINGERS IV SOLN: INTRAVENOUS | Status: DC

## 2022-03-27 MED ORDER — CEFAZOLIN SODIUM-DEXTROSE 2-4 GM/100ML-% IV SOLN
2.0000 g | INTRAVENOUS | Status: AC
  Administered 2022-03-27: 2 g via INTRAVENOUS

## 2022-03-27 MED ORDER — MORPHINE SULFATE (PF) 2 MG/ML IV SOLN: 2.0000 mg | INTRAVENOUS | Status: DC | PRN

## 2022-03-27 MED ORDER — FENTANYL CITRATE (PF) 250 MCG/5ML IJ SOLN
INTRAMUSCULAR | Status: AC
  Filled 2022-03-27: qty 5

## 2022-03-27 MED ORDER — OXYCODONE HCL 5 MG PO TABS
ORAL_TABLET | ORAL | Status: AC
  Filled 2022-03-27: qty 2

## 2022-03-27 MED ORDER — PHENYLEPHRINE HCL (PRESSORS) 10 MG/ML IV SOLN
INTRAVENOUS | Status: AC
  Filled 2022-03-27: qty 1

## 2022-03-27 MED ORDER — FENTANYL CITRATE PF 50 MCG/ML IJ SOSY
PREFILLED_SYRINGE | INTRAMUSCULAR | Status: AC
  Filled 2022-03-27: qty 1

## 2022-03-27 MED ORDER — ACETAMINOPHEN 10 MG/ML IV SOLN
1000.0000 mg | Freq: Once | INTRAVENOUS | Status: DC | PRN
  Administered 2022-03-27: 1000 mg via INTRAVENOUS

## 2022-03-27 MED ORDER — SODIUM CHLORIDE 0.9 % IV SOLN: 250.0000 mL | INTRAVENOUS | Status: DC | PRN

## 2022-03-27 MED ORDER — ACETAMINOPHEN 650 MG RE SUPP: 650.0000 mg | RECTAL | Status: DC | PRN

## 2022-03-27 MED ORDER — CHLORHEXIDINE GLUCONATE 0.12 % MT SOLN
15.0000 mL | Freq: Once | OROMUCOSAL | Status: AC
  Administered 2022-03-27: 15 mL via OROMUCOSAL

## 2022-03-27 MED ORDER — EPHEDRINE SULFATE (PRESSORS) 50 MG/ML IJ SOLN
INTRAMUSCULAR | Status: DC | PRN
  Administered 2022-03-27: 5 mg via INTRAVENOUS
  Administered 2022-03-27: 10 mg via INTRAVENOUS

## 2022-03-27 MED ORDER — FENTANYL CITRATE (PF) 100 MCG/2ML IJ SOLN
INTRAMUSCULAR | Status: DC | PRN
  Administered 2022-03-27 (×4): 25 ug via INTRAVENOUS

## 2022-03-27 MED ORDER — ONDANSETRON HCL 4 MG/2ML IJ SOLN
INTRAMUSCULAR | Status: AC
  Filled 2022-03-27: qty 2

## 2022-03-27 MED ORDER — PROPOFOL 10 MG/ML IV BOLUS
INTRAVENOUS | Status: DC | PRN
  Administered 2022-03-27: 150 mg via INTRAVENOUS

## 2022-03-27 MED ORDER — ACETAMINOPHEN 10 MG/ML IV SOLN
INTRAVENOUS | Status: AC
  Filled 2022-03-27: qty 100

## 2022-03-27 MED ORDER — CEFAZOLIN SODIUM-DEXTROSE 2-4 GM/100ML-% IV SOLN
INTRAVENOUS | Status: AC
  Filled 2022-03-27: qty 100

## 2022-03-27 MED ORDER — SODIUM CHLORIDE 0.9% FLUSH: 3.0000 mL | INTRAVENOUS | Status: DC | PRN

## 2022-03-27 MED ORDER — FENTANYL CITRATE PF 50 MCG/ML IJ SOSY
25.0000 ug | PREFILLED_SYRINGE | INTRAMUSCULAR | Status: DC | PRN
  Administered 2022-03-27: 50 ug via INTRAVENOUS

## 2022-03-27 MED ORDER — SODIUM CHLORIDE 0.9 % IR SOLN
Status: DC | PRN
  Administered 2022-03-27: 3000 mL

## 2022-03-27 MED ORDER — PHENYLEPHRINE HCL-NACL 20-0.9 MG/250ML-% IV SOLN
INTRAVENOUS | Status: DC | PRN
  Administered 2022-03-27: 40 ug/min via INTRAVENOUS

## 2022-03-27 MED ORDER — LIDOCAINE HCL (PF) 2 % IJ SOLN
INTRAMUSCULAR | Status: AC
  Filled 2022-03-27: qty 5

## 2022-03-27 MED ORDER — DEXAMETHASONE SODIUM PHOSPHATE 10 MG/ML IJ SOLN
INTRAMUSCULAR | Status: AC
  Filled 2022-03-27: qty 1

## 2022-03-27 MED ORDER — OXYCODONE HCL 5 MG PO TABS
5.0000 mg | ORAL_TABLET | ORAL | Status: DC | PRN
  Administered 2022-03-27: 10 mg via ORAL

## 2022-03-27 MED ORDER — EPHEDRINE 5 MG/ML INJ
INTRAVENOUS | Status: AC
  Filled 2022-03-27: qty 5

## 2022-03-27 MED ORDER — ONDANSETRON HCL 4 MG/2ML IJ SOLN
INTRAMUSCULAR | Status: DC | PRN
  Administered 2022-03-27: 4 mg via INTRAVENOUS

## 2022-03-27 MED ORDER — IOHEXOL 300 MG/ML  SOLN
INTRAMUSCULAR | Status: DC | PRN
  Administered 2022-03-27: 8 mL

## 2022-03-27 MED ORDER — ORAL CARE MOUTH RINSE: 15.0000 mL | Freq: Once | OROMUCOSAL | Status: AC

## 2022-03-27 MED ORDER — SODIUM CHLORIDE 0.9% FLUSH: 3.0000 mL | Freq: Two times a day (BID) | INTRAVENOUS | Status: DC

## 2022-03-27 SURGICAL SUPPLY — 37 items
BAG DRN RND TRDRP ANRFLXCHMBR (UROLOGICAL SUPPLIES)
BAG URINE DRAIN 2000ML AR STRL (UROLOGICAL SUPPLIES) IMPLANT
BAG URO CATCHER STRL LF (MISCELLANEOUS) ×2 IMPLANT
BALLN CATH NEPHR ULTRAXX 8X125 (BALLOONS) ×2
BASKET STONE NCOMPASS (UROLOGICAL SUPPLIES) IMPLANT
CATH BALLOON NEPH ULTRXX 8X125 (BALLOONS) IMPLANT
CATH FOLEY 3WAY 30CC 22FR (CATHETERS) IMPLANT
CATH URETERAL DUAL LUMEN 10F (MISCELLANEOUS) IMPLANT
CATH URETL OPEN 5X70 (CATHETERS) IMPLANT
CLOTH BEACON ORANGE TIMEOUT ST (SAFETY) ×2 IMPLANT
DRAPE FOOT SWITCH (DRAPES) ×2 IMPLANT
ELECT REM PT RETURN 15FT ADLT (MISCELLANEOUS) ×2 IMPLANT
EXTRACTOR STONE NITINOL NGAGE (UROLOGICAL SUPPLIES) IMPLANT
GLOVE SURG SS PI 8.0 STRL IVOR (GLOVE) ×2 IMPLANT
GOWN STRL REUS W/ TWL XL LVL3 (GOWN DISPOSABLE) ×2 IMPLANT
GOWN STRL REUS W/TWL XL LVL3 (GOWN DISPOSABLE) ×2
GUIDEWIRE STR DUAL SENSOR (WIRE) ×2 IMPLANT
HOLDER FOLEY CATH W/STRAP (MISCELLANEOUS) IMPLANT
IV NS IRRIG 3000ML ARTHROMATIC (IV SOLUTION) ×2 IMPLANT
KIT TURNOVER KIT A (KITS) ×2 IMPLANT
LASER FIB FLEXIVA PULSE ID 365 (Laser) IMPLANT
LASER FIB FLEXIVA PULSE ID 550 (Laser) IMPLANT
LASER FIB FLEXIVA PULSE ID 910 (Laser) IMPLANT
LOOP CUT BIPOLAR 24F LRG (ELECTROSURGICAL) IMPLANT
MANIFOLD NEPTUNE II (INSTRUMENTS) ×2 IMPLANT
PACK CYSTO (CUSTOM PROCEDURE TRAY) ×2 IMPLANT
SHEATH NAVIGATOR HD 11/13X36 (SHEATH) IMPLANT
STENT URET 6FRX26 CONTOUR (STENTS) IMPLANT
SUT ETHILON 3 0 PS 1 (SUTURE) IMPLANT
SYR 30ML LL (SYRINGE) IMPLANT
SYR TOOMEY IRRIG 70ML (MISCELLANEOUS)
SYRINGE TOOMEY IRRIG 70ML (MISCELLANEOUS) IMPLANT
TRACTIP FLEXIVA PULS ID 200XHI (Laser) IMPLANT
TRACTIP FLEXIVA PULSE ID 200 (Laser)
TRAY FOLEY MTR SLVR 16FR STAT (SET/KITS/TRAYS/PACK) IMPLANT
TUBING CONNECTING 10 (TUBING) ×2 IMPLANT
TUBING UROLOGY SET (TUBING) ×2 IMPLANT

## 2022-03-27 NOTE — Op Note (Signed)
Procedure: 1.  Cystoscopy with urethral dilation. 2.  Cystoscopy with right retrograde pyelogram and interpretation. 3.  Right ureteroscopy with biopsy and fulguration of urothelial neoplasm. 4.  Cystoscopy with insertion of right ureteral stent. 5.  Application of fluoroscopy.  Preop diagnosis: Right upper pole filling defect with a history of urothelial neoplasms.  Postop diagnosis: Bulbar urethral stricture with the urothelial neoplasm of the right upper pole.  Surgeon: Dr. Irine Seal.  Anesthesia: General.  Specimen: Ureteroscopic biopsies of the right upper pole urothelial neoplasm and cytologic washings from the right upper pole.  Drains: 6 French by 26 cm right contour double-J stent and 18 French Foley catheter.  EBL: None.  Complications: None.  Indications: The patient is a 82 year old male with a history of urothelial carcinoma of the bladder who was recently evaluated with a hematuria CT for hematuria and was found to have a filling defect in the upper pole concerning for urothelial neoplasm.  Procedure: He was taken the operating room where general anesthetic was induced and he was given antibiotics.  He is placed in lithotomy position and fitted with PAS hose.  His perineum and genitalia were prepped with Betadine solution he was draped in usual sterile fashion.  Cystoscopy was performed using a 21 [place] scope and 30 degree lens.  Examination revealed a normal anterior urethra but there was a stricture in the bulbar which she is had in the past and required dilations for that had recurred.  A sensor wire was passed through the stricture into the bladder under fluoroscopic guidance and the cystoscope was removed.  The 15 French by 24 cm urethral balloon dilation catheter was then passed over the wire and the balloon was inflated to 20 atm with a mixture of contrast and saline.  Fluoroscopically the waist was eliminated.  The balloon was then removed and the cystoscope was  reinserted alongside the wire.  He was noted to have excellent disruption of the stricture.  The external sphincter was somewhat patulous.  The prostatic urethra did not have significant obstruction but was rather fixed.  Examination of the bladder demonstrated mild to moderate trabeculation without tumors, stones or inflammation.  Ureteral orifices were unremarkable.  The right ureteral orifice was cannulated with a 5 [place] open-ended catheter and contrast was instilled.  Right retrograde pyelogram revealed some mild narrowing of the distal ureter but an otherwise normal caliber ureter to the kidney.  There was a filling defect in the upper pole consistent with the CT findings but the mid and lower calyces were unremarkable.  The sensor wire was then reinserted to the kidney and the cystoscope and open-ended catheter were removed.  The inner core of an 11/13 [place] by 46 cm ureteral access sheath was inserted with minimal resistance to the kidney over the wire and fluoroscopic guidance.  This was followed by the assembled sheath.  Once the sheath was in good position, the inner core and wire were removed.  The digital dual-lumen ureteroscope was then passed through the sheath to the kidney and the collecting system was inspected.  The mid and lower calyces were unremarkable.  The upper pole however harbored a papillary urothelial neoplasm as expected.  A cup biopsy forceps was used to obtain 2 specimens from the tumor mass and during the fulguration procedure additional washings were obtained for cytopathology.  Once the biopsies have been obtained the 47 m holmium laser fiber was passed through the scope and the tumor was then ablated the tumor ablation setting on  the Moses laser with 1 J and 10 Hz on the left pedal and 1 J and 20 Hz on the right pedal.  I was able to ablate a moderate amount of the tumor but visualization became poor and I do not think I was able to complete the  ablation.  Ureteroscope was then removed and a sensor wire was replaced the kidney through the access sheath.  The access sheath was removed and the cystoscope was reinserted over the wire.  A 6 French by 26 cm contour double-J stent was inserted to the kidney under fluoroscopic guidance.  The wire was removed, leaving a good coil in the kidney and a good coil in the bladder.  The cystoscope was removed and an 27 French Foley catheter was inserted.  The balloon was filled with 10 mL of sterile fluid and the catheter was placed to straight drainage.  He was taken down from lithotomy position, his anesthetic was reversed and he was moved recovery room in stable condition.  There were no complications.

## 2022-03-27 NOTE — Anesthesia Postprocedure Evaluation (Signed)
Anesthesia Post Note  Patient: [name]  Procedure(s) Performed: CYSTOSCOPY WITH RIGHT RETROGRADE PYELOGRAM, URETEROSCOPY AND STENT PLACEMENT urethral dilation (Right: Ureter) CYSTOSCOPY WITH BIOPSY (Ureter)     Patient location during evaluation: PACU Anesthesia Type: General Level of consciousness: awake and alert Pain management: pain level controlled Vital Signs Assessment: post-procedure vital signs reviewed and stable Respiratory status: spontaneous breathing, nonlabored ventilation, respiratory function stable and patient connected to nasal cannula oxygen Cardiovascular status: blood pressure returned to baseline and stable Postop Assessment: no apparent nausea or vomiting Anesthetic complications: no   No notable events documented.  Last Vitals:  Vitals:   03/27/22 1400 03/27/22 1430  BP: (!) 150/70 (!) 137/93  Pulse: 65 76  Resp:  16  Temp:    SpO2: 92% 92%    Last Pain:  Vitals:   03/27/22 1430  TempSrc:   PainSc: 8                  Shacola Schussler P Everrett Lacasse

## 2022-03-27 NOTE — Anesthesia Procedure Notes (Signed)
Procedure Name: LMA Insertion Date/Time: 03/27/2022 10:11 AM  Performed by: Garrel Ridgel, CRNAPre-anesthesia Checklist: Patient identified, Emergency Drugs available, Suction available and Patient being monitored Patient Re-evaluated:Patient Re-evaluated prior to induction Oxygen Delivery Method: Circle system utilized Preoxygenation: Pre-oxygenation with 100% oxygen Induction Type: IV induction Ventilation: Mask ventilation without difficulty LMA: LMA with gastric port inserted LMA Size: 4.0 Tube type: Oral Number of attempts: 1 Placement Confirmation: ETT inserted through vocal cords under direct vision, positive ETCO2 and breath sounds checked- equal and bilateral Tube secured with: Tape Dental Injury: Teeth and Oropharynx as per pre-operative assessment

## 2022-03-27 NOTE — Discharge Instructions (Addendum)
You may remove the catheter at home on Monday by following the attached instruction and if you don't feel you can do that, please let me know.  You may resume the plavix on Monday.

## 2022-03-27 NOTE — Transfer of Care (Signed)
Immediate Anesthesia Transfer of Care Note  Patient: [name]  Procedure(s) Performed: CYSTOSCOPY WITH RIGHT RETROGRADE PYELOGRAM, URETEROSCOPY AND STENT PLACEMENT urethral dilation (Right: Ureter) CYSTOSCOPY WITH BIOPSY (Ureter)  Patient Location: PACU  Anesthesia Type:General  Level of Consciousness: awake, alert , oriented, and patient cooperative  Airway & Oxygen Therapy: Patient Spontanous Breathing and Patient connected to face mask oxygen  Post-op Assessment: Report given to RN and Post -op Vital signs reviewed and stable  Post vital signs: Reviewed and stable  Last Vitals:  Vitals Value Taken Time  BP 163/87 03/27/22 1111  Temp    Pulse 67 03/27/22 1115  Resp 18 03/27/22 1115  SpO2 97 % 03/27/22 1115  Vitals shown include unvalidated device data.  Last Pain:  Vitals:   03/27/22 0808  TempSrc:   PainSc: 6       Patients Stated Pain Goal: 5 ([id number])  Complications: No notable events documented.

## 2022-03-27 NOTE — Anesthesia Preprocedure Evaluation (Addendum)
Anesthesia Evaluation  Patient identified by MRN, date of birth, ID band Patient awake    Reviewed: Allergy & Precautions, NPO status , Patient's Chart, lab work & pertinent test results  Airway Mallampati: II  TM Distance: >3 FB Neck ROM: Full    Dental  (+) Edentulous Upper, Edentulous Lower   Pulmonary sleep apnea , COPD, former smoker   Pulmonary exam normal        Cardiovascular hypertension, Pt. on medications and Pt. on home beta blockers + CAD, + Past MI, +CHF and + DOE   Rhythm:Regular Rate:Normal  ECHO 2021:  1. Left ventricular ejection fraction, by estimation, is 50 to 55%. The  left ventricle has low normal function. The left ventricle has no regional  wall motion abnormalities. Left ventricular diastolic parameters are  consistent with Grade II diastolic  dysfunction (pseudonormalization).   2. Right ventricular systolic function is normal. The right ventricular  size is normal. There is moderately elevated pulmonary artery systolic  pressure.   3. Left atrial size was moderately dilated.   4. The mitral valve is normal in structure. Trivial mitral valve  regurgitation. No evidence of mitral stenosis.   5. The aortic valve is grossly normal. Aortic valve regurgitation is  trivial. No aortic stenosis is present.     Neuro/Psych  Headaches  Anxiety Depression       GI/Hepatic Neg liver ROS, hiatal hernia,GERD  Medicated,,  Endo/Other  diabetesHypothyroidism    Renal/GU   negative genitourinary   Musculoskeletal  (+) Arthritis , Osteoarthritis,    Abdominal Normal abdominal exam  (+)   Peds  Hematology  (+) Blood dyscrasia, anemia   Anesthesia Other Findings   Reproductive/Obstetrics                             Anesthesia Physical Anesthesia Plan  ASA: 3  Anesthesia Plan: General   Post-op Pain Management:    Induction: Intravenous  PONV Risk Score and Plan: 2 and  Ondansetron, Dexamethasone and Treatment may vary due to age or medical condition  Airway Management Planned: Mask and LMA  Additional Equipment: None  Intra-op Plan:   Post-operative Plan: Extubation in OR  Informed Consent: I have reviewed the patients History and Physical, chart, labs and discussed the procedure including the risks, benefits and alternatives for the proposed anesthesia with the patient or authorized representative who has indicated his/her understanding and acceptance.     Dental advisory given  Plan Discussed with:   Anesthesia Plan Comments:        Anesthesia Quick Evaluation

## 2022-03-27 NOTE — Interval H&P Note (Signed)
History and Physical Interval Note:  03/27/2022 8:24 AM  [name]  has presented today for surgery, with the diagnosis of RIGHT RENAL UROTHELIAL NEOPLASM.  The various methods of treatment have been discussed with the patient and family. After consideration of risks, benefits and other options for treatment, the patient has consented to  Procedure(s): CYSTOSCOPY WITH RIGHT RETROGRADE PYELOGRAM, URETEROSCOPY AND STENT PLACEMENT (Right) CYSTOSCOPY WITH BIOPSY (N/A) as a surgical intervention.  The patient's history has been reviewed, patient examined, no change in status, stable for surgery.  I have reviewed the patient's chart and labs.  Questions were answered to the patient's satisfaction.     Irine Seal

## 2022-03-30 ENCOUNTER — Encounter (HOSPITAL_COMMUNITY): Payer: Self-pay | Admitting: Urology

## 2022-03-30 LAB — SURGICAL PATHOLOGY

## 2022-04-01 LAB — CYTOLOGY - NON PAP

## 2022-04-03 DIAGNOSIS — C651 Malignant neoplasm of right renal pelvis: Secondary | ICD-10-CM | POA: Diagnosis not present

## 2022-04-06 ENCOUNTER — Telehealth: Payer: Self-pay | Admitting: Internal Medicine

## 2022-04-06 DIAGNOSIS — E038 Other specified hypothyroidism: Secondary | ICD-10-CM

## 2022-04-06 MED ORDER — VENLAFAXINE HCL 75 MG PO TABS: 75.0000 mg | ORAL_TABLET | Freq: Every day | ORAL | 1 refills | Status: DC

## 2022-04-06 NOTE — Telephone Encounter (Signed)
Paper fax and escript sent.

## 2022-04-06 NOTE — Telephone Encounter (Signed)
Pt son in law is calling and [employer] need more info on venlafaxine (EFFEXOR) 75 MG tablet . Pt son in law does not know the reason

## 2022-04-08 ENCOUNTER — Ambulatory Visit: Payer: Medicare Other | Admitting: Cardiovascular Disease

## 2022-04-10 ENCOUNTER — Ambulatory Visit (AMBULATORY_SURGERY_CENTER): Payer: Medicare Other | Admitting: Gastroenterology

## 2022-04-10 ENCOUNTER — Encounter: Payer: Self-pay | Admitting: Gastroenterology

## 2022-04-10 VITALS — BP 137/72 | HR 68 | Temp 97.1°F | Resp 17 | Ht 69.0 in | Wt 177.0 lb

## 2022-04-10 DIAGNOSIS — K2289 Other specified disease of esophagus: Secondary | ICD-10-CM | POA: Diagnosis not present

## 2022-04-10 DIAGNOSIS — K6289 Other specified diseases of anus and rectum: Secondary | ICD-10-CM

## 2022-04-10 DIAGNOSIS — K635 Polyp of colon: Secondary | ICD-10-CM

## 2022-04-10 DIAGNOSIS — K219 Gastro-esophageal reflux disease without esophagitis: Secondary | ICD-10-CM | POA: Diagnosis not present

## 2022-04-10 DIAGNOSIS — R195 Other fecal abnormalities: Secondary | ICD-10-CM

## 2022-04-10 DIAGNOSIS — G4730 Sleep apnea, unspecified: Secondary | ICD-10-CM | POA: Diagnosis not present

## 2022-04-10 DIAGNOSIS — B3781 Candidal esophagitis: Secondary | ICD-10-CM | POA: Diagnosis not present

## 2022-04-10 DIAGNOSIS — D123 Benign neoplasm of transverse colon: Secondary | ICD-10-CM | POA: Diagnosis not present

## 2022-04-10 DIAGNOSIS — K648 Other hemorrhoids: Secondary | ICD-10-CM | POA: Diagnosis not present

## 2022-04-10 DIAGNOSIS — K229 Disease of esophagus, unspecified: Secondary | ICD-10-CM

## 2022-04-10 DIAGNOSIS — K644 Residual hemorrhoidal skin tags: Secondary | ICD-10-CM

## 2022-04-10 DIAGNOSIS — J449 Chronic obstructive pulmonary disease, unspecified: Secondary | ICD-10-CM | POA: Diagnosis not present

## 2022-04-10 DIAGNOSIS — R1319 Other dysphagia: Secondary | ICD-10-CM

## 2022-04-10 DIAGNOSIS — K449 Diaphragmatic hernia without obstruction or gangrene: Secondary | ICD-10-CM

## 2022-04-10 DIAGNOSIS — D125 Benign neoplasm of sigmoid colon: Secondary | ICD-10-CM | POA: Diagnosis not present

## 2022-04-10 DIAGNOSIS — E119 Type 2 diabetes mellitus without complications: Secondary | ICD-10-CM | POA: Diagnosis not present

## 2022-04-10 DIAGNOSIS — F32A Depression, unspecified: Secondary | ICD-10-CM | POA: Diagnosis not present

## 2022-04-10 MED ORDER — SODIUM CHLORIDE 0.9 % IV SOLN: 500.0000 mL | Freq: Once | INTRAVENOUS | Status: DC

## 2022-04-10 MED ORDER — PANTOPRAZOLE SODIUM 40 MG PO TBEC: 40.0000 mg | DELAYED_RELEASE_TABLET | Freq: Two times a day (BID) | ORAL | 3 refills | Status: DC

## 2022-04-10 MED ORDER — SUCRALFATE 1 GM/10ML PO SUSP: 1.0000 g | Freq: Two times a day (BID) | ORAL | 1 refills | Status: DC

## 2022-04-10 NOTE — Op Note (Signed)
[facility] Patient Name: [name] Procedure Date: 04/10/2022 3:33 PM MRN: [id number] Endoscopist: Justice Britain , MD, [id number] Age: 82 Referring MD:  Date of Birth: 1940-02-16 Gender: Male Account #: [id number] Procedure:                Upper GI endoscopy Indications:              Upper abdominal pain, Dysphagia Medicines:                Monitored Anesthesia Care Procedure:                Pre-Anesthesia Assessment:                           - Prior to the procedure, a History and Physical                            was performed, and patient medications and                            allergies were reviewed. The patient's tolerance of                            previous anesthesia was also reviewed. The risks                            and benefits of the procedure and the sedation                            options and risks were discussed with the patient.                            All questions were answered, and informed consent                            was obtained. Prior Anticoagulants: The patient                            last took Plavix (clopidogrel) 5 days prior to the                            procedure and has taken no anticoagulant or                            antiplatelet agents except for aspirin. ASA Grade                            Assessment: III - A patient with severe systemic                            disease. After reviewing the risks and benefits,                            the patient was deemed in satisfactory condition to  undergo the procedure.                           After obtaining informed consent, the endoscope was                            passed under direct vision. Throughout the                            procedure, the patient's blood pressure, pulse, and                            oxygen saturations were monitored continuously. The                            GIF [id number] was  introduced through the                            mouth, and advanced to the second part of duodenum.                            The upper GI endoscopy was accomplished without                            difficulty. The patient tolerated the procedure. Scope In: Scope Out: Findings:                 No gross lesions were noted in the majority of the                            esophagus. Biopsies were taken with a cold forceps                            for histology to rule out EOE/LOE.                           One tongue of salmon-colored mucosa was present                            from 38 to 40 cm in the distal esophagus. No other                            visible abnormalities were present. Biopsies were                            taken with a cold forceps for histology.                           The Z-line was irregular and was found 40 cm from                            the incisors.  After the rest of the EGD was completed, a                            guidewire was placed and the scope was withdrawn.                            Dilation was performed in the esophagus with a                            Savary dilator with no resistance at 16 mm and mild                            resistance at 18 mm. The dilation site was examined                            following endoscope reinsertion and showed at the                            GE junction, moderate mucosal disruption, moderate                            improvement in luminal narrowing and no perforation.                           A 1 cm hiatal hernia was present.                           Striped moderately erythematous mucosa without                            bleeding was found in the gastric antrum.                           No other gross lesions were noted in the entire                            examined stomach. Biopsies were taken with a cold                            forceps for histology  and Helicobacter pylori                            testing.                           A mild angulation deformity was found in the                            duodenal sweep.                           No gross mucosal lesions were noted in the duodenal  bulb, in the first portion of the duodenum and in                            the second portion of the duodenum. Biopsies were                            taken with a cold forceps for histology. Complications:            No immediate complications. Estimated Blood Loss:     Estimated blood loss was minimal. Impression:               - No gross lesions in the majority of the                            esophagus. Biopsied.                           - Salmon-colored mucosal tongue suspicious for                            short-segment Barrett's esophagus found distally.                            Biopsied.                           - Z-line irregular, 40 cm from the incisors.                           - 1 cm hiatal hernia.                           - Dilation performed in the esophagus with savory                            16 mm and 18 mm with mucosal wrent noted at the GE                            junction.                           - Erythematous mucosa in the antrum. No other gross                            lesions in the entire stomach. Biopsied.                           - Duodenal angulation deformity in the duodenal                            sweep. No other gross mucosal lesions in the                            duodenal bulb, in the first portion of the duodenum  and in the second portion of the duodenum. Biopsied. Recommendation:           - Proceed to scheduled Flexible Sigmoidoscopy.                           - Increase PPI to 40 mg twice daily for 2 months                            then decrease back to once daily.                           - Carafate liquid twice daily for  2 weeks.                           - Dilation diet as per protocol.                           - Please use Cepacol or Halls Lozenges +/-                            Chloraseptic spray for next 72-96 hours to aid in                            sore thoat should you experience this.                           - See flexible sigmoidoscopy for timing of restart                            of Plavix.                           - The findings and recommendations were discussed                            with the patient.                           - The findings and recommendations were discussed                            with the patient's family. Justice Britain, MD 04/10/2022 4:32:35 PM

## 2022-04-10 NOTE — Progress Notes (Unsigned)
Uneventful anesthetic. Report to pacu rn. Vss. Care resumed by rn. 

## 2022-04-10 NOTE — Progress Notes (Signed)
Called to room to assist during endoscopic procedure.  Patient ID and intended procedure confirmed with present staff. Received instructions for my participation in the procedure from the performing physician.  

## 2022-04-10 NOTE — Op Note (Signed)
[facility] Patient Name: [name] Procedure Date: 04/10/2022 3:31 PM MRN: [id number] Endoscopist: Justice Britain , MD, [id number] Age: 82 Referring MD:  Date of Birth: 1941-01-04 Gender: Male Account #: [id number] Procedure:                Flexible Sigmoidoscopy Indications:              Anal pain, Change in stool caliber Medicines:                Monitored Anesthesia Care Procedure:                Pre-Anesthesia Assessment:                           - Prior to the procedure, a History and Physical                            was performed, and patient medications and                            allergies were reviewed. The patient's tolerance of                            previous anesthesia was also reviewed. The risks                            and benefits of the procedure and the sedation                            options and risks were discussed with the patient.                            All questions were answered, and informed consent                            was obtained. Prior Anticoagulants: The patient                            last took Plavix (clopidogrel) 5 days prior to the                            procedure and has taken no anticoagulant or                            antiplatelet agents except for aspirin. ASA Grade                            Assessment: III - A patient with severe systemic                            disease. After reviewing the risks and benefits,                            the patient was deemed in satisfactory condition to  undergo the procedure.                           After obtaining informed consent, the scope was                            passed under direct vision. The Olympus [id number]                            [id number]) Colonoscope was introduced through the                            anus and advanced to the the left transverse colon.                            The flexible  sigmoidoscopy was accomplished without                            difficulty. The patient tolerated the procedure.                            The quality of the bowel preparation was poor. Scope In: 4:12:02 PM Scope Out: 4:21:47 PM Total Procedure Duration: 0 hours 9 minutes 45 seconds  Findings:                 The digital rectal exam findings include                            hemorrhoids. Pertinent negatives include no                            palpable rectal lesions.                           Two sessile polyps were found in the transverse                            colon. The polyps were 6 to 8 mm in size. These                            polyps were removed with a cold snare. Resection                            and retrieval were complete.                           A large amount of semi-liquid semi-solid stool was                            found in the descending colon, at the splenic                            flexure and in the transverse colon, interfering  with visualization. Lavage of the area was                            performed using copious amounts, resulting in                            incomplete clearance with fair visualization.                           Normal mucosa was found in the rest of the                            visualized rectum, in the recto-sigmoid colon, in                            the sigmoid colon and in the descending colon.                            Biopsies were taken with a cold forceps for                            histology of the left colon. Biopsies were taken                            with a cold forceps for histology of the rectum.                           Non-bleeding non-thrombosed external and internal                            hemorrhoids were found during retroflexion, during                            perianal exam and during digital exam. The                            hemorrhoids were Grade II  (internal hemorrhoids                            that prolapse but reduce spontaneously). Complications:            No immediate complications. Estimated Blood Loss:     Estimated blood loss was minimal. Impression:               - Preparation of the colon was poor.                           - Hemorrhoids found on digital rectal exam.                           - Two 6 to 8 mm polyps in the transverse colon,                            removed with a cold snare. Resected and retrieved.                           -  Stool in the descending colon, at the splenic                            flexure and in the transverse colon.                           - Normal mucosa in the rectum, in the recto-sigmoid                            colon, in the sigmoid colon and in the descending                            colon. Biopsied.                           - Non-bleeding non-thrombosed external and internal                            hemorrhoids. Recommendation:           - The patient will be observed post-procedure,                            until all discharge criteria are met.                           - Discharge patient to home.                           - Patient has a contact number available for                            emergencies. The signs and symptoms of potential                            delayed complications were discussed with the                            patient. Return to normal activities tomorrow.                            Written discharge instructions were provided to the                            patient.                           - High fiber diet.                           - Await pathology results.                           - The polyps that have been removed are most likely  adenomas, but will wait for final pathology. His                            previous full colonoscopy in 2019 done by another                            provider had not  shown any evidence of adenomatous                            tissue. In normal circumstances, would recommend a                            full colonoscopy within the next year, but with the                            patient now dealing with a new bladder/urothelial                            cancer, it is not clear how much benefit patient                            will have. Can certainly consider depending on                            patient's overall clinical standing in the course                            of the coming months.                           - Etiology of patient's rectal pain/anal pain still                            remains unclear. Query proctalgia fugax.                           - The findings and recommendations were discussed                            with the patient.                           - The findings and recommendations were discussed                            with the patient's family. Justice Britain, MD 04/10/2022 4:39:17 PM

## 2022-04-10 NOTE — Progress Notes (Unsigned)
GASTROENTEROLOGY PROCEDURE H&P NOTE   Primary Care Physician: Isaac Bliss, Rayford Halsted, MD  HPI: [name] is a 82 y.o. male who presents for EGD/Flex for evaluation of dysphagia, GERD, proctalgia, rectal pain.  Past Medical History:  Diagnosis Date   Allergy    Anxiety    B12 deficiency anemia    Blood transfusion without reported diagnosis    CAD (coronary artery disease)    Cancer (HCC)    bladder-    Colon polyps    COPD (chronic obstructive pulmonary disease) (HCC)    Depression    Diabetes mellitus (HCC)    Dyspnea    Esophagus, Barrett's    GERD (gastroesophageal reflux disease)    Headache    History of bladder cancer    Bladder cancer "8 times"   History of hiatal hernia    Hyperlipidemia    Hypertension    Localized osteoarthrosis, lower leg    Myocardial infarction ([place]) 2021   Pneumonia    Restless leg syndrome    Sleep apnea    does not wear cpap   Stenosis of esophagus    Past Surgical History:  Procedure Laterality Date   BILIARY BRUSHING  04/01/2018   Procedure: BILIARY BRUSHING;  Surgeon: Irving Copas., MD;  Location: [facility];  Service: Gastroenterology;;   BILIARY BRUSHING  09/12/2018   Procedure: BILIARY BRUSHING;  Surgeon: Irving Copas., MD;  Location: [facility]OSCOPY;  Service: Gastroenterology;;   BILIARY BRUSHING  11/28/2018   Procedure: BILIARY BRUSHING;  Surgeon: Irving Copas., MD;  Location: [place];  Service: Gastroenterology;;   BILIARY BRUSHING  03/11/2020   Procedure: BILIARY BRUSHING;  Surgeon: Irving Copas., MD;  Location: [facility];  Service: Gastroenterology;;   BILIARY DILATION  09/12/2018   Procedure: BILIARY DILATION;  Surgeon: Irving Copas., MD;  Location: [place];  Service: Gastroenterology;;   BILIARY DILATION  11/28/2018   Procedure: BILIARY DILATION;  Surgeon: Irving Copas., MD;  Location: [place];  Service: Gastroenterology;;    BILIARY DILATION  03/11/2020   Procedure: BILIARY DILATION;  Surgeon: Irving Copas., MD;  Location: [facility];  Service: Gastroenterology;;   BILIARY STENT PLACEMENT  04/01/2018   Procedure: BILIARY STENT PLACEMENT;  Surgeon: Irving Copas., MD;  Location: [place];  Service: Gastroenterology;;   BILIARY STENT PLACEMENT  09/12/2018   Procedure: BILIARY STENT PLACEMENT;  Surgeon: Irving Copas., MD;  Location: [place];  Service: Gastroenterology;;   BILIARY STENT PLACEMENT  11/28/2018   Procedure: BILIARY STENT PLACEMENT;  Surgeon: Irving Copas., MD;  Location: [place];  Service: Gastroenterology;;   BIOPSY  04/01/2018   Procedure: BIOPSY;  Surgeon: Irving Copas., MD;  Location: [place];  Service: Gastroenterology;;   BIOPSY  09/12/2018   Procedure: BIOPSY;  Surgeon: Irving Copas., MD;  Location: [place];  Service: Gastroenterology;;   BIOPSY  11/28/2018   Procedure: BIOPSY;  Surgeon: Irving Copas., MD;  Location: [place];  Service: Gastroenterology;;   BIOPSY  03/11/2020   Procedure: BIOPSY;  Surgeon: Irving Copas., MD;  Location: [facility];  Service: Gastroenterology;;   bladder cancer      x 8 cystoscopy   CERVICAL DISCECTOMY     ACDF   CHOLECYSTECTOMY     COLONOSCOPY  11/17/2005   normal    CORONARY ARTERY BYPASS GRAFT     x4   CORONARY STENT INTERVENTION N/A 01/05/2019   Procedure: CORONARY STENT INTERVENTION;  Surgeon: End,  Harrell Gave, MD;  Location: [place] [facility];  Service: Cardiovascular;  Laterality: N/A;   CYSTOSCOPY WITH BIOPSY N/A 03/27/2022   Procedure: CYSTOSCOPY WITH BIOPSY;  Surgeon: Irine Seal, MD;  Location: [facility];  Service: Urology;  Laterality: N/A;   CYSTOSCOPY WITH RETROGRADE PYELOGRAM, URETEROSCOPY AND STENT PLACEMENT Right 03/27/2022   Procedure: CYSTOSCOPY WITH RIGHT RETROGRADE PYELOGRAM, URETEROSCOPY AND STENT PLACEMENT urethral dilation;   Surgeon: Irine Seal, MD;  Location: [facility];  Service: Urology;  Laterality: Right;   ENDOSCOPIC MUCOSAL RESECTION  09/12/2018   Procedure: ENDOSCOPIC MUCOSAL RESECTION;  Surgeon: Rush Landmark Telford Nab., MD;  Location: [facility];  Service: Gastroenterology;;   ENDOSCOPIC RETROGRADE CHOLANGIOPANCREATOGRAPHY (ERCP) WITH PROPOFOL N/A 04/01/2018   Procedure: ENDOSCOPIC RETROGRADE CHOLANGIOPANCREATOGRAPHY (ERCP) WITH PROPOFOL;  Surgeon: Irving Copas., MD;  Location: [place];  Service: Gastroenterology;  Laterality: N/A;   ENDOSCOPIC RETROGRADE CHOLANGIOPANCREATOGRAPHY (ERCP) WITH PROPOFOL N/A 09/12/2018   Procedure: ENDOSCOPIC RETROGRADE CHOLANGIOPANCREATOGRAPHY (ERCP) WITH PROPOFOL;  Surgeon: Rush Landmark Telford Nab., MD;  Location: [place];  Service: Gastroenterology;  Laterality: N/A;   ENDOSCOPIC RETROGRADE CHOLANGIOPANCREATOGRAPHY (ERCP) WITH PROPOFOL N/A 03/11/2020   Procedure: ENDOSCOPIC RETROGRADE CHOLANGIOPANCREATOGRAPHY (ERCP) WITH PROPOFOL;  Surgeon: Rush Landmark Telford Nab., MD;  Location: [facility];  Service: Gastroenterology;  Laterality: N/A;   ENDOSCOPIC RETROGRADE CHOLANGIOPANCREATOGRAPHY (ERCP) WITH PROPOFOL N/A 06/03/2020   Procedure: ENDOSCOPIC RETROGRADE CHOLANGIOPANCREATOGRAPHY (ERCP) WITH PROPOFOL;  Surgeon: Rush Landmark Telford Nab., MD;  Location: [facility];  Service: Gastroenterology;  Laterality: N/A;   ERCP N/A 11/28/2018   Procedure: ENDOSCOPIC RETROGRADE CHOLANGIOPANCREATOGRAPHY (ERCP) +EGD with spyglass;  Surgeon: Rush Landmark Telford Nab., MD;  Location: [place];  Service: Gastroenterology;  Laterality: N/A;   ESOPHAGOGASTRODUODENOSCOPY  04/29/2010   ESOPHAGOGASTRODUODENOSCOPY (EGD) WITH PROPOFOL N/A 04/01/2018   Procedure: ESOPHAGOGASTRODUODENOSCOPY (EGD) WITH PROPOFOL;  Surgeon: Rush Landmark Telford Nab., MD;  Location: [place];  Service: Gastroenterology;  Laterality: N/A;   ESOPHAGOGASTRODUODENOSCOPY (EGD) WITH PROPOFOL N/A 09/12/2018    Procedure: ESOPHAGOGASTRODUODENOSCOPY (EGD) WITH PROPOFOL;  Surgeon: Rush Landmark Telford Nab., MD;  Location: [place];  Service: Gastroenterology;  Laterality: N/A;   ESOPHAGOGASTRODUODENOSCOPY (EGD) WITH PROPOFOL N/A 11/28/2018   Procedure: ESOPHAGOGASTRODUODENOSCOPY (EGD) WITH PROPOFOL;  Surgeon: Rush Landmark Telford Nab., MD;  Location: [place];  Service: Gastroenterology;  Laterality: N/A;   ESOPHAGOGASTRODUODENOSCOPY (EGD) WITH PROPOFOL N/A 03/11/2020   Procedure: ESOPHAGOGASTRODUODENOSCOPY (EGD) WITH PROPOFOL;  Surgeon: Rush Landmark Telford Nab., MD;  Location: [facility];  Service: Gastroenterology;  Laterality: N/A;   ESOPHAGOGASTRODUODENOSCOPY (EGD) WITH PROPOFOL N/A 06/03/2020   Procedure: ESOPHAGOGASTRODUODENOSCOPY (EGD) WITH PROPOFOL;  Surgeon: Rush Landmark Telford Nab., MD;  Location: [facility];  Service: Gastroenterology;  Laterality: N/A;   EUS  04/01/2018   Procedure: FULL UPPER ENDOSCOPIC ULTRASOUND (EUS) RADIAL;  Surgeon: Irving Copas., MD;  Location: [place];  Service: Gastroenterology;;   EUS N/A 09/12/2018   Procedure: UPPER ENDOSCOPIC ULTRASOUND (EUS) RADIAL;  Surgeon: Irving Copas., MD;  Location: [place];  Service: Gastroenterology;  Laterality: N/A;   FINE NEEDLE ASPIRATION  09/12/2018   Procedure: FINE NEEDLE ASPIRATION (FNA) LINEAR;  Surgeon: Irving Copas., MD;  Location: [place];  Service: Gastroenterology;;   HAND SURGERY Left 2018   saw accident   HEMOSTASIS CLIP PLACEMENT  09/12/2018   Procedure: HEMOSTASIS CLIP PLACEMENT;  Surgeon: Irving Copas., MD;  Location: [place];  Service: Gastroenterology;;   HEMOSTASIS CLIP PLACEMENT  06/03/2020   Procedure: HEMOSTASIS CLIP PLACEMENT;  Surgeon: Irving Copas., MD;  Location: [facility];  Service: Gastroenterology;;   I & D EXTREMITY Left 11/24/2016   Procedure: IRRIGATION  AND DEBRIDEMENT LEFT HAND, THUMB, INDEX, MIDDLE, RING, AND SMALL FINGERS  WITH RECONSTRUCTION;  Surgeon: Roseanne Kaufman, MD;  Location: [place];  Service: Orthopedics;  Laterality: Left;   KNEE ARTHROSCOPY Left    LEFT HEART CATH AND CORS/GRAFTS ANGIOGRAPHY N/A 01/05/2019   Procedure: LEFT HEART CATH AND CORS/GRAFTS ANGIOGRAPHY;  Surgeon: Nelva Bush, MD;  Location: [place] [facility];  Service: Cardiovascular;  Laterality: N/A;   LUMBAR LAMINECTOMY     and fusion x 2   NASAL SINUS SURGERY     POPLITEAL SYNOVIAL CYST EXCISION     REMOVAL OF STONES  04/01/2018   Procedure: REMOVAL OF STONES;  Surgeon: Rush Landmark Telford Nab., MD;  Location: [place];  Service: Gastroenterology;;   REMOVAL OF STONES  09/12/2018   Procedure: REMOVAL OF STONES;  Surgeon: Irving Copas., MD;  Location: [place];  Service: Gastroenterology;;   REMOVAL OF STONES  11/28/2018   Procedure: REMOVAL OF STONES;  Surgeon: Irving Copas., MD;  Location: [place];  Service: Gastroenterology;;   REMOVAL OF STONES  03/11/2020   Procedure: REMOVAL OF STONES;  Surgeon: Irving Copas., MD;  Location: [facility];  Service: Gastroenterology;;   REMOVAL OF STONES  06/03/2020   Procedure: REMOVAL OF STONES;  Surgeon: Irving Copas., MD;  Location: [facility];  Service: Gastroenterology;;   Azzie Almas DILATION N/A 09/12/2018   Procedure: Renato Battles;  Surgeon: Irving Copas., MD;  Location: [place];  Service: Gastroenterology;  Laterality: N/A;   SAVORY DILATION N/A 11/28/2018   Procedure: SAVORY DILATION;  Surgeon: Rush Landmark Telford Nab., MD;  Location: [place];  Service: Gastroenterology;  Laterality: N/A;   SEPTOPLASTY Bilateral 05/26/2021   Procedure: SEPTOPLASTY;  Surgeon: Leta Baptist, MD;  Location: [place];  Service: ENT;  Laterality: Bilateral;   SPHINCTEROTOMY  04/01/2018   Procedure: Joan Mayans;  Surgeon: Mansouraty, Telford Nab., MD;  Location: [place];  Service: Gastroenterology;;   Bess Kinds  CHOLANGIOSCOPY N/A 11/28/2018   Procedure: [id number] CHOLANGIOSCOPY;  Surgeon: Irving Copas., MD;  Location: [place];  Service: Gastroenterology;  Laterality: N/A;   SPYGLASS CHOLANGIOSCOPY N/A 06/03/2020   Procedure: SPYGLASS CHOLANGIOSCOPY;  Surgeon: Irving Copas., MD;  Location: [facility];  Service: Gastroenterology;  Laterality: N/A;   STENT REMOVAL  09/12/2018   Procedure: STENT REMOVAL;  Surgeon: Irving Copas., MD;  Location: [place];  Service: Gastroenterology;;   Lavell Islam REMOVAL  11/28/2018   Procedure: STENT REMOVAL;  Surgeon: Irving Copas., MD;  Location: [place];  Service: Gastroenterology;;   Lavell Islam REMOVAL  03/11/2020   Procedure: STENT REMOVAL;  Surgeon: Irving Copas., MD;  Location: [facility];  Service: Gastroenterology;;   SUBMUCOSAL LIFTING INJECTION  09/12/2018   Procedure: SUBMUCOSAL LIFTING INJECTION;  Surgeon: Irving Copas., MD;  Location: [place];  Service: Gastroenterology;;   Current Outpatient Medications  Medication Sig Dispense Refill   acetaminophen (TYLENOL) 500 MG tablet Take 1,000 mg by mouth every 6 (six) hours as needed for moderate pain.     amLODipine (NORVASC) 2.5 MG tablet TAKE 2 TABLETS BY MOUTH DAILY 180 tablet 3   aspirin EC 81 MG tablet Take 81 mg by mouth every evening. Swallow whole.     cetirizine (ZYRTEC) 10 MG tablet Take 10 mg by mouth daily as needed for allergies.     Coenzyme Q10 300 MG CAPS Take 300 mg by mouth every evening.     cyanocobalamin (VITAMIN B12) 1000 MCG/ML injection INJECT 1 ML SUBCUTANEOUSLY ONCE EVERY MONTH  6 mL 11   ezetimibe (ZETIA) 10 MG tablet TAKE 1 TABLET BY MOUTH DAILY 100 tablet 1   Ferrous Sulfate (IRON PO) Take 1 tablet by mouth in the morning.     isosorbide mononitrate (IMDUR) 60 MG 24 hr tablet TAKE 1 TABLET BY MOUTH DAILY 90 tablet 3   METAMUCIL FIBER PO Take 1 capsule by mouth in the morning.     Multiple Vitamins-Minerals  (MULTIVITAMIN WITH MINERALS) tablet Take 1 tablet by mouth daily. Gummy     nebivolol (BYSTOLIC) 10 MG tablet Take 1 tablet (10 mg total) by mouth daily. Please schedule appointment for additional refills. 100 tablet 1   oxyCODONE-acetaminophen (PERCOCET) 10-325 MG tablet Take 1 tablet by mouth 2 (two) times daily as needed for pain. 60 tablet 0   pantoprazole (PROTONIX) 40 MG tablet Take 1 tablet (40 mg total) by mouth daily. (Patient taking differently: Take 40 mg by mouth every evening.) 90 tablet 2   Probiotic Product (PROBIOTIC ADVANCED PO) Take 1 tablet by mouth every evening. Capsule     Probiotic Product (PROBIOTIC GUMMIES PO) Take 1 capsule by mouth in the morning.     rOPINIRole (REQUIP) 2 MG tablet TAKE 1 TABLET BY MOUTH AT  BEDTIME 100 tablet 2   rosuvastatin (CRESTOR) 10 MG tablet TAKE 1 TABLET BY MOUTH DAILY 60 tablet 5   venlafaxine (EFFEXOR) 75 MG tablet Take 1 tablet (75 mg total) by mouth daily. 90 tablet 1   clopidogrel (PLAVIX) 75 MG tablet TAKE 1 TABLET BY MOUTH ONCE  DAILY 60 tablet 5   fluticasone (FLONASE) 50 MCG/ACT nasal spray Place 1 spray into both nostrils daily as needed for allergies.     Menthol, Topical Analgesic, (BENGAY EX) Apply 1 application topically daily as needed (pain).     Naphazoline-Pheniramine (OPCON-A) 0.027-0.315 % SOLN Place 1 drop into both eyes daily as needed (itching eyes).     NEEDLE, DISP, 25 G (B-D DISP NEEDLE 25GX1") 25G X 1" MISC Inject 1000 mcg into muscle once a month. 50 each 0   nitroGLYCERIN (NITROSTAT) 0.4 MG SL tablet PLACE 1 TABLET UNDER THE TONGUE EVERY 5 MINUTES AS NEEDED FOR CHEST PAIN 25 tablet 2   Current Facility-Administered Medications  Medication Dose Route Frequency Provider Last Rate Last Admin   0.9 %  sodium chloride infusion  500 mL Intravenous Once Mansouraty, Telford Nab., MD        Current Outpatient Medications:    acetaminophen (TYLENOL) 500 MG tablet, Take 1,000 mg by mouth every 6 (six) hours as needed for  moderate pain., Disp: , Rfl:    amLODipine (NORVASC) 2.5 MG tablet, TAKE 2 TABLETS BY MOUTH DAILY, Disp: 180 tablet, Rfl: 3   aspirin EC 81 MG tablet, Take 81 mg by mouth every evening. Swallow whole., Disp: , Rfl:    cetirizine (ZYRTEC) 10 MG tablet, Take 10 mg by mouth daily as needed for allergies., Disp: , Rfl:    Coenzyme Q10 300 MG CAPS, Take 300 mg by mouth every evening., Disp: , Rfl:    cyanocobalamin (VITAMIN B12) 1000 MCG/ML injection, INJECT 1 ML SUBCUTANEOUSLY ONCE EVERY MONTH, Disp: 6 mL, Rfl: 11   ezetimibe (ZETIA) 10 MG tablet, TAKE 1 TABLET BY MOUTH DAILY, Disp: 100 tablet, Rfl: 1   Ferrous Sulfate (IRON PO), Take 1 tablet by mouth in the morning., Disp: , Rfl:    isosorbide mononitrate (IMDUR) 60 MG 24 hr tablet, TAKE 1 TABLET BY MOUTH DAILY, Disp: 90 tablet, Rfl: 3  METAMUCIL FIBER PO, Take 1 capsule by mouth in the morning., Disp: , Rfl:    Multiple Vitamins-Minerals (MULTIVITAMIN WITH MINERALS) tablet, Take 1 tablet by mouth daily. Gummy, Disp: , Rfl:    nebivolol (BYSTOLIC) 10 MG tablet, Take 1 tablet (10 mg total) by mouth daily. Please schedule appointment for additional refills., Disp: 100 tablet, Rfl: 1   oxyCODONE-acetaminophen (PERCOCET) 10-325 MG tablet, Take 1 tablet by mouth 2 (two) times daily as needed for pain., Disp: 60 tablet, Rfl: 0   pantoprazole (PROTONIX) 40 MG tablet, Take 1 tablet (40 mg total) by mouth daily. (Patient taking differently: Take 40 mg by mouth every evening.), Disp: 90 tablet, Rfl: 2   Probiotic Product (PROBIOTIC ADVANCED PO), Take 1 tablet by mouth every evening. Capsule, Disp: , Rfl:    Probiotic Product (PROBIOTIC GUMMIES PO), Take 1 capsule by mouth in the morning., Disp: , Rfl:    rOPINIRole (REQUIP) 2 MG tablet, TAKE 1 TABLET BY MOUTH AT  BEDTIME, Disp: 100 tablet, Rfl: 2   rosuvastatin (CRESTOR) 10 MG tablet, TAKE 1 TABLET BY MOUTH DAILY, Disp: 60 tablet, Rfl: 5   venlafaxine (EFFEXOR) 75 MG tablet, Take 1 tablet (75 mg total) by  mouth daily., Disp: 90 tablet, Rfl: 1   clopidogrel (PLAVIX) 75 MG tablet, TAKE 1 TABLET BY MOUTH ONCE  DAILY, Disp: 60 tablet, Rfl: 5   fluticasone (FLONASE) 50 MCG/ACT nasal spray, Place 1 spray into both nostrils daily as needed for allergies., Disp: , Rfl:    Menthol, Topical Analgesic, (BENGAY EX), Apply 1 application topically daily as needed (pain)., Disp: , Rfl:    Naphazoline-Pheniramine (OPCON-A) 0.027-0.315 % SOLN, Place 1 drop into both eyes daily as needed (itching eyes)., Disp: , Rfl:    NEEDLE, DISP, 25 G (B-D DISP NEEDLE 25GX1") 25G X 1" MISC, Inject 1000 mcg into muscle once a month., Disp: 50 each, Rfl: 0   nitroGLYCERIN (NITROSTAT) 0.4 MG SL tablet, PLACE 1 TABLET UNDER THE TONGUE EVERY 5 MINUTES AS NEEDED FOR CHEST PAIN, Disp: 25 tablet, Rfl: 2  Current Facility-Administered Medications:    0.9 %  sodium chloride infusion, 500 mL, Intravenous, Once, Mansouraty, Telford Nab., MD No Known Allergies Family History  Problem Relation Age of Onset   Melanoma Mother    Stroke Father    Hypertension Father    Coronary artery disease Other    Colon cancer Neg Hx    Esophageal cancer Neg Hx    Stomach cancer Neg Hx    Rectal cancer Neg Hx    Pancreatic cancer Neg Hx    Liver disease Neg Hx    Inflammatory bowel disease Neg Hx    Social History   Socioeconomic History   Marital status: Widowed    Spouse name: Not on file   Number of children: Not on file   Years of education: Not on file   Highest education level: 12th grade  Occupational History   Not on file  Tobacco Use   Smoking status: Former    Packs/day: 0.50    Years: 5.00    Additional pack years: 0.00    Total pack years: 2.50    Types: Cigarettes    Quit date: 03/30/1976    Years since quitting: 46.0   Smokeless tobacco: Never  Vaping Use   Vaping Use: Never used  Substance and Sexual Activity   Alcohol use: No    Alcohol/week: 0.0 standard drinks of alcohol   Drug use: No   Sexual  activity: Yes   Other Topics Concern   Not on file  Social History Narrative   Not on file   Social Determinants of Health   Financial Resource Strain: Low Risk  (02/27/2022)   Overall Financial Resource Strain (CARDIA)    Difficulty of Paying Living Expenses: Not hard at all  Food Insecurity: No Food Insecurity (02/27/2022)   Hunger Vital Sign    Worried About Running Out of Food in the Last Year: Never true    Ran Out of Food in the Last Year: Never true  Transportation Needs: No Transportation Needs (02/27/2022)   PRAPARE - Hydrologist (Medical): No    Lack of Transportation (Non-Medical): No  Physical Activity: Unknown (02/27/2022)   Exercise Vital Sign    Days of Exercise per Week: 0 days    Minutes of Exercise per Session: Not on file  Stress: Stress Concern Present (02/27/2022)   [place]    Feeling of Stress : To some extent  Social Connections: Moderately Integrated (02/27/2022)   Social Connection and Isolation Panel [NHANES]    Frequency of Communication with Friends and Family: More than three times a week    Frequency of Social Gatherings with Friends and Family: Three times a week    Attends Religious Services: More than 4 times per year    Active Member of Clubs or Organizations: Yes    Attends Archivist Meetings: Never    Marital Status: Widowed  Intimate Partner Violence: Not on file    Physical Exam: Today's Vitals   04/10/22 1443 04/10/22 1448  BP: 122/71   Pulse: 60   Temp:  (!) 97.1 F (36.2 C)  SpO2: 94%   Weight: 177 lb (80.3 kg)   Height: 5\' 9"  (1.753 m)    Body mass index is 26.14 kg/m. GEN: NAD EYE: Sclerae anicteric ENT: MMM CV: Non-tachycardic GI: Soft, NT/ND NEURO:  Alert & Oriented x 3  Lab Results: No results for input(s): "WBC", "HGB", "HCT", "PLT" in the last 72 hours. BMET No results for input(s): "NA", "K", "CL", "CO2", "GLUCOSE", "BUN",  "CREATININE", "CALCIUM" in the last 72 hours. LFT No results for input(s): "PROT", "ALBUMIN", "AST", "ALT", "ALKPHOS", "BILITOT", "BILIDIR", "IBILI" in the last 72 hours. PT/INR No results for input(s): "LABPROT", "INR" in the last 72 hours.   Impression / Plan: This is a 82 y.o.male who presents for EGD/Flex for evaluation of dysphagia, GERD, proctalgia, rectal pain.  The risks and benefits of endoscopic evaluation/treatment were discussed with the patient and/or family; these include but are not limited to the risk of perforation, infection, bleeding, missed lesions, lack of diagnosis, severe illness requiring hospitalization, as well as anesthesia and sedation related illnesses.  The patient's history has been reviewed, patient examined, no change in status, and deemed stable for procedure.  The patient and/or family is agreeable to proceed.    Justice Britain, MD [facility] # [phone]

## 2022-04-10 NOTE — Progress Notes (Signed)
Pt's states no medical or surgical changes since previsit or office visit. Dx. Kidney cancer.

## 2022-04-10 NOTE — Patient Instructions (Addendum)
Information on hiatal hernia, polyps and hemorrhoids given to you today.  Follow dilation diet today - see handout.  Increase PPI to 40 mg twice a day for 2 months , then decrease back to once daily.  Carafate liquid twice daily for 2 weeks.    Please use Cepacol or Halls lozenges and chloraseptic spray for next 72-96 hours to aid in sore throat if you are experiencing this.  Restart Plavix in two days.     YOU HAD AN ENDOSCOPIC PROCEDURE TODAY AT [facility]:   Refer to the procedure report that was given to you for any specific questions about what was found during the examination.  If the procedure report does not answer your questions, please call your gastroenterologist to clarify.  If you requested that your care partner not be given the details of your procedure findings, then the procedure report has been included in a sealed envelope for you to review at your convenience later.  YOU SHOULD EXPECT: Some feelings of bloating in the abdomen. Passage of more gas than usual.  Walking can help get rid of the air that was put into your GI tract during the procedure and reduce the bloating. If you had a lower endoscopy (such as a colonoscopy or flexible sigmoidoscopy) you may notice spotting of blood in your stool or on the toilet paper. If you underwent a bowel prep for your procedure, you may not have a normal bowel movement for a few days.  Please Note:  You might notice some irritation and congestion in your nose or some drainage.  This is from the oxygen used during your procedure.  There is no need for concern and it should clear up in a day or so.  SYMPTOMS TO REPORT IMMEDIATELY:  Following lower endoscopy (colonoscopy or flexible sigmoidoscopy):  Excessive amounts of blood in the stool  Significant tenderness or worsening of abdominal pains  Swelling of the abdomen that is new, acute  Fever of 100F or higher  Following upper endoscopy (EGD)  Vomiting of blood  or coffee ground material  New chest pain or pain under the shoulder blades  Painful or persistently difficult swallowing  New shortness of breath  Fever of 100F or higher  Black, tarry-looking stools  For urgent or emergent issues, a gastroenterologist can be reached at any hour by calling [phone]. Do not use MyChart messaging for urgent concerns.    DIET:  We do recommend a small meal at first, but then you may proceed to your regular diet.  Drink plenty of fluids but you should avoid alcoholic beverages for 24 hours.  ACTIVITY:  You should plan to take it easy for the rest of today and you should NOT DRIVE or use heavy machinery until tomorrow (because of the sedation medicines used during the test).    FOLLOW UP: Our staff will call the number listed on your records the next business day following your procedure.  We will call around 7:15- 8:00 am to check on you and address any questions or concerns that you may have regarding the information given to you following your procedure. If we do not reach you, we will leave a message.     If any biopsies were taken you will be contacted by phone or by letter within the next 1-3 weeks.  Please call us at [phone] if you have not heard about the biopsies in 3 weeks.    SIGNATURES/CONFIDENTIALITY: You and/or your care partner  have signed paperwork which will be entered into your electronic medical record.  These signatures attest to the fact that that the information above on your After Visit Summary has been reviewed and is understood.  Full responsibility of the confidentiality of this discharge information lies with you and/or your care-partner.

## 2022-04-11 ENCOUNTER — Other Ambulatory Visit: Payer: Self-pay | Admitting: Internal Medicine

## 2022-04-13 ENCOUNTER — Telehealth: Payer: Self-pay

## 2022-04-13 NOTE — Telephone Encounter (Signed)
Left message on follow up call. 

## 2022-04-17 ENCOUNTER — Encounter: Payer: Self-pay | Admitting: Gastroenterology

## 2022-04-20 ENCOUNTER — Telehealth: Payer: Self-pay

## 2022-04-20 ENCOUNTER — Telehealth: Payer: Self-pay | Admitting: Internal Medicine

## 2022-04-20 DIAGNOSIS — C651 Malignant neoplasm of right renal pelvis: Secondary | ICD-10-CM | POA: Diagnosis not present

## 2022-04-20 DIAGNOSIS — C678 Malignant neoplasm of overlapping sites of bladder: Secondary | ICD-10-CM | POA: Diagnosis not present

## 2022-04-20 DIAGNOSIS — N35012 Post-traumatic membranous urethral stricture: Secondary | ICD-10-CM | POA: Diagnosis not present

## 2022-04-20 NOTE — Progress Notes (Signed)
Care Management & Coordination Services Pharmacy Team  Reason for Encounter: Hypertension  Contacted patient to discuss hypertension disease state. Unsuccessful outreach. Left voicemail for patient to return call. Multiple attempts   Current antihypertensive regimen:  Amlodipine 2.5 mg 2 tablets daily Isosorbide 60 mg daily Bystolic 10 mg daily Patient verbally confirms he is taking the above medications as directed.   How often are you checking your Blood Pressure?   he checks his blood pressure   taking his medication.  Current home BP readings:  DATE:             BP               PULSE   Wrist or arm cuff:  OTC medications including pseudoephedrine or NSAIDs?  Any readings above 180/100?  If yes any symptoms of hypertensive emergency?   What recent interventions/DTPs have been made by any provider to improve Blood Pressure control since last CPP Visit:   Any recent hospitalizations or ED visits since last visit with CPP?   What diet changes have been made to improve Blood Pressure Control?  Patient follows Breakfast -  Lunch -  Dinner -  Caffeine intake -  Salt intake -   What exercise is being done to improve your Blood Pressure Control?    Adherence Review: Is the patient currently on ACE/ARB medication? No Does the patient have >5 day gap between last estimated fill dates? No  Care Gaps: AWV -  completed 07/02/2016 Foot exam - 03/03/2022 Eye exam - never done Next appt -  Hep C Screen - never done Shingrix - never done Covid - never done   Star Rating Drugs: Rosuvastatin 10 mg - last filled 03/15/2022 100 DS at [facility]   Chart Updates: Recent office visits:  03/03/2022 Chaya Jan MD - Patient was seen for Diabetes mellitus type 2, noninsulin dependent and additional concerns. No medication changes.   Recent consult visits:  12/18/2022 Alcide Evener NP (GI) - Patient was seen for proctalgia and additional concerns. No  medication changes.   11/20/2022 Nicki Guadalajara MD (cardiology) - Patient was seen for Coronary artery disease involving coronary bypass graft of native heart with angina pectoris and additional concerns. No medication changes.   Hospital visits:  Admitted to [facility] on 03/27/2022 (7 hours) due to cystoscopy with right retrograde pyelogram, ureteroscopy and stent placement and ureteral dilation.  New?Medications Started at [facility] Discharge:?? None Medication Changes at Hospital Discharge: oxyCODONE-acetaminophen (PERCOCET) Medications Discontinued at Hospital Discharge: furosemide 40 MG tablet (LASIX) Medications that remain the same after Hospital Discharge:??  -All other medications will remain the same.    Medications: Outpatient Encounter Medications as of 04/20/2022  Medication Sig Note   acetaminophen (TYLENOL) 500 MG tablet Take 1,000 mg by mouth every 6 (six) hours as needed for moderate pain.    amLODipine (NORVASC) 2.5 MG tablet TAKE 2 TABLETS BY MOUTH DAILY    aspirin EC 81 MG tablet Take 81 mg by mouth every evening. Swallow whole.    B-D 3CC LUER-LOK SYR 23GX1" 23G X 1" 3 ML MISC USE WITH B12 INJECTION    cetirizine (ZYRTEC) 10 MG tablet Take 10 mg by mouth daily as needed for allergies.    clopidogrel (PLAVIX) 75 MG tablet TAKE 1 TABLET BY MOUTH ONCE  DAILY    Coenzyme Q10 300 MG CAPS Take 300 mg by mouth every evening.    cyanocobalamin (VITAMIN B12) 1000 MCG/ML injection INJECT 1 ML SUBCUTANEOUSLY ONCE  EVERY MONTH 03/20/2022: Last dose:03/18/22   ezetimibe (ZETIA) 10 MG tablet TAKE 1 TABLET BY MOUTH DAILY    Ferrous Sulfate (IRON PO) Take 1 tablet by mouth in the morning.    fluticasone (FLONASE) 50 MCG/ACT nasal spray Place 1 spray into both nostrils daily as needed for allergies.    isosorbide mononitrate (IMDUR) 60 MG 24 hr tablet TAKE 1 TABLET BY MOUTH DAILY    Menthol, Topical Analgesic, (BENGAY EX) Apply 1 application topically daily as needed (pain).     METAMUCIL FIBER PO Take 1 capsule by mouth in the morning.    Multiple Vitamins-Minerals (MULTIVITAMIN WITH MINERALS) tablet Take 1 tablet by mouth daily. Gummy    Naphazoline-Pheniramine (OPCON-A) 0.027-0.315 % SOLN Place 1 drop into both eyes daily as needed (itching eyes).    nebivolol (BYSTOLIC) 10 MG tablet Take 1 tablet (10 mg total) by mouth daily. Please schedule appointment for additional refills.    NEEDLE, DISP, 25 G (B-D DISP NEEDLE 25GX1") 25G X 1" MISC Inject 1000 mcg into muscle once a month.    nitroGLYCERIN (NITROSTAT) 0.4 MG SL tablet PLACE 1 TABLET UNDER THE TONGUE EVERY 5 MINUTES AS NEEDED FOR CHEST PAIN    oxyCODONE-acetaminophen (PERCOCET) 10-325 MG tablet Take 1 tablet by mouth 2 (two) times daily as needed for pain.    pantoprazole (PROTONIX) 40 MG tablet Take 1 tablet (40 mg total) by mouth 2 (two) times daily.    Probiotic Product (PROBIOTIC ADVANCED PO) Take 1 tablet by mouth every evening. Capsule    Probiotic Product (PROBIOTIC GUMMIES PO) Take 1 capsule by mouth in the morning.    rOPINIRole (REQUIP) 2 MG tablet TAKE 1 TABLET BY MOUTH AT  BEDTIME    rosuvastatin (CRESTOR) 10 MG tablet TAKE 1 TABLET BY MOUTH DAILY    sucralfate (CARAFATE) 1 GM/10ML suspension Take 10 mLs (1 g total) by mouth 2 (two) times daily.    venlafaxine (EFFEXOR) 75 MG tablet Take 1 tablet (75 mg total) by mouth daily.    No facility-administered encounter medications on file as of 04/20/2022.  Fill History:   Dispensed Days Supply Quantity Provider Pharmacy  AMLODIPINE BESYLATE  2.5 MG TABS 01/23/2022 100 200 tablet      Dispensed Days Supply Quantity Provider Pharmacy  CLOPIDOGREL  75 MG TABS 01/23/2022 100 100 tablet      Dispensed Days Supply Quantity Provider Pharmacy  CYANOCOBALAMIN  1000 MCG/ML SOLN 04/16/2022 84 3 mL      Dispensed Days Supply Quantity Provider Pharmacy  EZETIMIBE  10 MG TABS 03/15/2022 100 100 tablet      Dispensed Days Supply Quantity Provider Pharmacy   ISOSORBIDE MONONITRATE ER  60 MG TB24 01/23/2022 100 100 tablet      Dispensed Days Supply Quantity Provider Pharmacy  NEBIVOLOL HYDROCHLORIDE  10 MG TABS 03/15/2022 100 100 tablet      Dispensed Days Supply Quantity Provider Pharmacy  NITROGLYCER 0.4MG    SUB 07/18/2021 25 25 each      Dispensed Days Supply Quantity Provider Pharmacy  OXYCOD/ACETAM 10-325MG  TAB 03/31/2022 30 60 each      Dispensed Days Supply Quantity Provider Pharmacy  PANTOPRAZOLE SOD 40MG  TAB 04/10/2022 45 90 each      Dispensed Days Supply Quantity Provider Pharmacy  ROPINIROLE HCL  2 MG TABS 04/03/2022 100 100 tablet      Dispensed Days Supply Quantity Provider Pharmacy  ROSUVASTATIN CALCIUM  10 MG TABS 03/15/2022 100 100 tablet      Dispensed Days Supply  Quantity Provider Pharmacy  SUCRALFATE 1GM/10ML SUS 04/10/2022 21 420 mL      Dispensed Days Supply Quantity Provider Pharmacy  VENLAFAXINE HCL  75 MG TABS 04/06/2022 90 90 tablet      Recent Office Vitals: BP Readings from Last 3 Encounters:  04/10/22 137/72  03/27/22 (!) 153/99  03/25/22 (!) 144/65   Pulse Readings from Last 3 Encounters:  04/10/22 68  03/27/22 68  03/25/22 (!) 59    Wt Readings from Last 3 Encounters:  04/10/22 177 lb (80.3 kg)  03/27/22 167 lb 1.7 oz (75.8 kg)  03/25/22 167 lb (75.8 kg)     Kidney Function Lab Results  Component Value Date/Time   CREATININE 1.45 (H) 03/25/2022 08:55 AM   CREATININE 1.46 12/17/2021 10:03 AM   CREATININE 1.61 (H) 11/09/2019 09:39 AM   CREATININE 1.17 12/26/2015 10:34 AM   GFR 45.43 (L) 12/17/2021 10:03 AM   GFRNONAA 49 (L) 03/25/2022 08:55 AM   GFRAA 47 (L) 11/13/2019 10:06 AM       Latest Ref Rng & Units 03/25/2022    8:55 AM 12/17/2021   10:03 AM 05/20/2021   12:33 PM  BMP  Glucose 70 - 99 mg/dL 427  062  376   BUN 8 - 23 mg/dL 18  23  20    Creatinine 0.61 - 1.24 mg/dL 2.83  1.51  7.61   Sodium 135 - 145 mmol/L 144  141  140   Potassium 3.5 - 5.1 mmol/L 4.3  4.6  4.7    Chloride 98 - 111 mmol/L 107  104  107   CO2 22 - 32 mmol/L 28  32  28   Calcium 8.9 - 10.3 mg/dL 8.9  9.0  8.9    Inetta Fermo [facility]  Clinical Pharmacist Assistant [phone]

## 2022-04-20 NOTE — Telephone Encounter (Signed)
scheduled per 3/22 referral, pt has been called and confirmed date and time. Pt is aware of location and to arrive early for check in   

## 2022-04-21 ENCOUNTER — Telehealth: Payer: Self-pay | Admitting: Cardiovascular Disease

## 2022-04-21 ENCOUNTER — Other Ambulatory Visit: Payer: Self-pay | Admitting: Urology

## 2022-04-21 NOTE — Telephone Encounter (Signed)
   Patient Name: [name]  DOB: March 15, 1940 MRN: [id number]  Primary Cardiologist: [name], MD  Chart reviewed as part of pre-operative protocol coverage.   Patient was contacted to discuss upcoming procedure and to evaluate for any changes to current state of health.  He was unavailable at this time and detailed message left for him to return call at his earliest convenience.   Okay to hold plavix x 5 days prior to procedure and resume when medically safe to do so.   Mable Fill, Marissa Nestle, NP 04/21/2022, 11:34 AM

## 2022-04-21 NOTE — Telephone Encounter (Signed)
   [facility] Pre-operative Risk Assessment    Request for surgical clearance:  What type of surgery is being performed?  Right Nephroureterectomy   When is this surgery scheduled?  06/03/22   What type of clearance is required (medical clearance vs. Pharmacy clearance to hold med vs. Both)?  Both   Are there any medications that need to be held prior to surgery and how long? Plavix, 5-7 days prior Aspirin, 5 days prior   Practice name and name of physician performing surgery?  [facility]  Dr. Tresa Moore    What is your office phone number? [phone] (ext#: [phone])    7.   What is your office fax number? [phone]  8.   Anesthesia type (None, local, MAC, general)?  General    Zara Council 04/21/2022, 11:20 AM

## 2022-04-27 NOTE — Telephone Encounter (Signed)
Attempted to contact patient as part of preoperative protocol.  Patient unavailable at the time of call.  His Plavix may be held for 5 days prior to his procedure.  He will need callback.

## 2022-04-28 ENCOUNTER — Encounter: Payer: Self-pay | Admitting: Cardiovascular Disease

## 2022-04-28 ENCOUNTER — Other Ambulatory Visit: Payer: Self-pay

## 2022-04-28 ENCOUNTER — Ambulatory Visit: Payer: Medicare Other | Attending: Cardiovascular Disease | Admitting: Cardiovascular Disease

## 2022-04-28 VITALS — BP 128/62 | Ht 69.0 in | Wt 170.0 lb

## 2022-04-28 DIAGNOSIS — C680 Malignant neoplasm of urethra: Secondary | ICD-10-CM

## 2022-04-28 DIAGNOSIS — I10 Essential (primary) hypertension: Secondary | ICD-10-CM

## 2022-04-28 DIAGNOSIS — C641 Malignant neoplasm of right kidney, except renal pelvis: Secondary | ICD-10-CM

## 2022-04-28 DIAGNOSIS — Z01810 Encounter for preprocedural cardiovascular examination: Secondary | ICD-10-CM | POA: Diagnosis not present

## 2022-04-28 DIAGNOSIS — Z951 Presence of aortocoronary bypass graft: Secondary | ICD-10-CM

## 2022-04-28 DIAGNOSIS — E785 Hyperlipidemia, unspecified: Secondary | ICD-10-CM

## 2022-04-28 DIAGNOSIS — I25810 Atherosclerosis of coronary artery bypass graft(s) without angina pectoris: Secondary | ICD-10-CM

## 2022-04-28 NOTE — Patient Instructions (Addendum)
Medication Instructions:  No changes   *If you need a refill on your cardiac medications before your next appointment, please call your pharmacy*   Lab Work: Not needed    Testing/Procedures: Your physician has requested that you have an echocardiogram. Echocardiography is a painless test that uses sound waves to create images of your heart. It provides your doctor with information about the size and shape of your heart and how well your heart's chambers and valves are working. This procedure takes approximately one hour. There are no restrictions for this procedure. Please do NOT wear cologne, perfume, aftershave, or lotions (deodorant is allowed). Please arrive 15 minutes prior to your appointment time.    And  Your doctor has scheduled you for a Myocardial Perfusion scan to get information about the blood flow to your heart. The test consists of taking pictures of your heart in two phases: while resting and after a stress test.  The stress test part -you will be given a drug intended to have a similar effect on the heart to that of exercise.  The test will take approximately 3 to 4  hours to complete.  How to prepare for your test: Do not eat or drink 2 hours prior to your test Do not consume products containing caffeine 12 hours prior to your test (examples: coffee (regular OR decaf), chocolate, sodas, tea) Your doctor may need you to hold certain medications prior to the test.  If so, these are listed below and should not be taken for 24 hours prior to the test.  If not listed below, you may take your medications as normal.  You may resume taking held medications on your normal schedule once the test is complete.   Meds to hold: none Do bring a list of your current medications with you.  If you have held any meds in preparation for the test, please bring them, as you may be required to take them once the test is completed. Do wear comfortable clothes and walking shoes.  Do not wear  dresses or overalls. Do NOT wear cologne, perfume, aftershave, or fragranced lotions the day of your test (deodorants okay). If these instructions are not followed your test will have to be rescheduled.   A nuclear cardiologist will review your test, prepare a report and send it to your physician.   If you have questions or concerns about your appointment, you can call the Nuclear Cardiology department at [phone]. If you cannot keep your appointment, please provide 48 hours notification to avoid a possible $50.00 charge to your account.   Please arrive 15 minutes prior to your appointment time for registration and insurance purposes  Follow-Up: At [facility], you and your health needs are our priority.  As part of our continuing mission to provide you with exceptional heart care, we have created designated Provider Care Teams.  These Care Teams include your primary Cardiologist (physician) and Advanced Practice Providers (APPs -  Physician Assistants and Nurse Practitioners) who all work together to provide you with the care you need, when you need it.     Your next appointment:   3 to 4 week(s)  The format for your next appointment:   In Person  Provider:   Joni Reining, DNP, ANP, Azalee Course, PA-C, Bernadene Person, NP, or Carlos Levering, NP  .   Other Instructions    With clearance for surgery - you will hold Plavix 5 days prior

## 2022-04-28 NOTE — Progress Notes (Signed)
Patient ID: [name], male   DOB: September 23, 1940, 82 y.o.   MRN: [id number]        HPI: [name] is a 82 y.o. male who presents to the office today for a 6 month cardiology followup evaluation.  Mr. Cabiness has known CAD and underwent CABG revascularization surgery in 2001 with a LIMA to his LAD, vein to the marginal, vein to the RCA. Catheterization in 2004 showed patent grafts. An echo Doppler study in August 2012 revealed an ejection fraction of 40-45% with moderate septal hypokinesis with grade 1 diastolic dysfunction as well as aortic valve sclerosis. Additional problems include GERD, mild emphysema, as well as obstructive sleep apnea. He no longer utilizes CPAP. He has lost approximately 25 pounds since his wife's death from a motor vehicle accident in January 2013.  He remains active and notes mild shortness of breath with activity particularly when walking up hills but this has not changed.  He also is a history of restless leg syndrome and has benefited with requip. Laboratory in 2013 demonstrated LDL 57 with LDL particle #1035, small LDL particle number was 611, slightly increased, HDLC was 35 triglycerides 93 total cholesterol 111 initial resistance course slightly elevated at 53.  BP blood work in March 2015 revealed normal renal function.  Fasting glucose was minimally increased at 106.  Total cholesterol was 116 triglycerides 80, and LDL 66, with an HDL of 34.  LDL particle number was slightly increased at 1190.  He undewent a five-year follow-up nuclear perfusion study in November 2016.  Ejection fraction was 54%.  He had normal perfusion.  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  He admits to arthralgias without myalgias.  He has issues with his in his legs and his peripheral neuropathy.  I further titrated his losartan to 75 mg and Bystolic to 10 mg.  He believes his blood pressure and pulse have been  better.  He admits to leg discomfort which seems worse with atorvastatin.  When I last saw him, he had noticed some shortness of breath with uphill walking and with exertion but denied recurrent episodes of chest tightness.   He denied PND, orthopnea.  He underwent a follow-up echo Doppler study on 10/28/2016.  Ejection fraction was excellent at 60-65%.  There was grade 2 diastolic dysfunction.  There was evidence for aortic sclerosis without stenosis.  There was mild LA dilation.  He had trivial PR.    I saw him in October 2018. He subsequently  underwent left hand surgery.  His primary care with Dr. Amador Cunas is retired.  When I last saw him in November 2019 recently, he  noticed progressive increase in shortness of breath and this seems to develop even trying to walk up very small hill.  He denied any definitive chest tightness.  He has started to run out of some of his chronic pain meds which had been prescribed by Dr. Amador Cunas.  He has not yet reestablished with another primary physician.  He was on Bystolic 10 mg and isosorbide 60 mg for his CAD and hypertension.  He was on Zetia 10 mg and rosuvastatin for hyperlipidemia.  He continues to be on chronic aspirin therapy.  He has restless legs and is on Requip.  His previous primary physician had prescribed Effexor in addition to Percocet.  In November 2019, with his change in symptomatology I recommended a follow-up echo Doppler study and a lexiscan  Myoview.  As he had had some issues with elevated potassium on ARB therapy and I started him on HCTZ other than spironolactone.  December 23, 2017 an echo Doppler study showed an EF of 60 to 65% with grade 1 diastolic dysfunction and mild aortic valve stenosis.  A Lexiscan Myoview study on January 04, 2018 was low risk and demonstrated normal perfusion without scar or ischemia.  EF was 51%.  He was hospitalized from March 11 through April 06, 2019 presenting with abdominal discomfort,.  Laboratory  revealed leukocytosis to 21,400, elevated lactic acid of 2.4, elevated lipase at 390 urinalysis notable for proteinuria and a creatinine had risen to 1.59 and there was mild elevation of AST and total bilirubin.  He was ultimately had positive blood cultures for coli to have sepsis and colitis.  An MRCP showed acute pancreatitis, mild gallbladder distention with peri-cholecystic fluid and gallbladder wall thickening.  School he underwent ERCP on April 01, 2018 which showed stricture and sludge without stone he underwent sphincterotomy and stent placement.  He is planned to be on antibiotics for several weeks.  During his hospitalization he developed acute kidney injury with peak creatinine at 2.27.  His medications were adjusted and on discharge creatinine was 1.75.    He was  evaluated by me in a telemedicine evaluation on May 19, 2018.  From a cardiac perspective he felt improved.  He was not having any leg swelling.  He denied any chest pain but continued to experience shortness of breath symptoms.  Creatinine had improved to 1.46.  He continues to have issues with back and leg discomfort and remotely had undergone steroid injections with [name].  I saw him in evaluation September 2020 at which time he continued  to experience his chronic shortness of breath but that has not increased in severity.  He denies any chest pain.  He has had some issues with swallowing and continues to see GI physician.  He has undergone esophageal dilatation.  He is unaware of any rhythm disturbance.  He has been evaluated by [name] and he was referred for a lower extremity Doppler study which was negative for DVT.  He had been noted to have a left pleural effusion which is followed by 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   He eventually underwent robotic cholecystectomy by Dr. Windle Guard on 01/02/2019.   He has biliary stents in place for distal biliary stricture.  After his procedure, he developed a  bilateral pneumonia and shortness of breath with increasing troponin.  CT of the chest was negative for PE.  Patient was treated with IV Lasix and antibiotic. Troponin was elevated and he subsequently underwent cardiac catheterization on 01/05/2019 by Dr. Saunders Revel which showed 99% subtotally occluded SVG-RPDA/RPL.  This was treated with 3 overlapping resolute Onyx drug-eluting stents.  Postprocedure, it was recommended to continue aspirin and Plavix indefinitely and if the patient subsequently developed  refractory symptoms there was consideration for PCI of ostial left main, left circumflex and OM1.  Echocardiogram obtained on the same day showed EF 40 to 45%, severe hypokinesis of the entire inferior and inferoseptal wall, grade 1 DD, mild MR, cannot exclude ASD/PFO.   He was seen in follow-up by Almyra Deforest on January 19, 2019.  He continued to have fatigue however gradually improving.  He previously had a epigastric pain which he attributed to acid reflux which retrospectively may have been his angina since there was complete resolution of this discomfort following  his hospitalization.  He was walking at home multiple times throughout the day without any issue.     He was seen in a telemedicine evaluation in March 2021 and over the several months prior to that evaluation he denied any recurrent chest pain episodes.  However he admitted to feeling "rough."  He was getting physical therapy times per week.  He has been staying at home all the time.  His heart rate has been regular.  He is not aware of any palpitations.  He had not been using CPAP.   When I saw him in June 2021 he complained that he "hurts all over."  He states he has aches in his legs, back and shoulders.  Oftentimes he notices his legs hurt worse before he has to go to the bathroom.  He denied any anginal symptoms and was unaware of any cardiac arrhythmia.  He denied presyncope or syncope.    He was evaluated by Edd Fabian in October 2021 and  most recently January 2022.  He had been started on Lasix for leg swelling in October 2021 and continued to be on Bystolic, Imdur, aspirin/Plavix and statin.    I saw him on March 22, 2020 at which time he felt well.  He had had purposeful weight loss.  He has a stent in the liver a due to his prior a sending cholangitis and pancreatitis due to biliary strictures.  He is scheduled to undergo a CT of his abdomen at the end of March for further evaluation.  He denies any chest pain or anginal symptomatology.  Breathing is better.  He admits to right shoulder discomfort.  During that evaluation, his blood pressure was stable amlodipine 5 mg, isosorbide 60 mg, and Bystolic 10 mg.  I saw him on December 05, 2020.  At that time he felt well from a cardiac standpoint.  He was continuing to have some pain in his shoulder particularly when raising his arms.   At times he developed some nausea following eating.  He will need dental extraction of approximately 10 teeth.  His blood pressure has been stable at home.    I last saw him on November 19, 2020.  He felt well from a cardiac standpoint.   He has been having continued difficulty with arthritis involving his neck, back, and shoulders.  He also has had difficulty swallowing solids and is scheduled to see follow-up GI.  In the past he has required several esophageal dilatations.  He continues to be on amlodipine at low-dose 2.5 mg, furosemide which he now takes as needed for swelling, isosorbide 60 mg daily, and nebivolol 10 mg daily for blood pressure and CAD.  He is on rosuvastatin 10 mg for hyperlipidemia.  He is on pantoprazole for GERD.  He continues to be on Effexor to help with some depression.  He denies presyncope or syncope or awareness of palpitations.    Since I last saw him, he continues to be followed by Dr. Philip Aspen for primary care he sees Dr. Bjorn Pippin for urology and recently underwent cystoscopy with right retrograde pyelogram uteroscopy  and stent placement on May 27, 2022.  He denies any chest pain.  He does experience shortness of breath with walking.  He is scheduled to undergo robot-assisted laparoscopic nephro ureterectomy by Dr. Sebastian Ache at [facility] on Jun 03, 2022 for renal cell CA.  He admits to trace edema.  He now sees Dr. Shirline Frees for oncology.  He continues to  be on amlodipine 5 mg, Plavix 75 mg without aspirin, nebivolol 10 mg, isosorbide 60 mg, and is on Zetia 10 mg and rosuvastatin 10 mg for hyperlipidemia.  He is on Effexor for depression.  He presents for evaluation.  Past Medical History:  Diagnosis Date   Allergy    Anxiety    B12 deficiency anemia    Blood transfusion without reported diagnosis    CAD (coronary artery disease)    Cancer    bladder-    Colon polyps    COPD (chronic obstructive pulmonary disease)    Depression    Diabetes mellitus    Dyspnea    Esophagus, Barrett's    GERD (gastroesophageal reflux disease)    Headache    History of bladder cancer    Bladder cancer "8 times"   History of hiatal hernia    Hyperlipidemia    Hypertension    Localized osteoarthrosis, lower leg    Myocardial infarction 2021   Pneumonia    Restless leg syndrome    Sleep apnea    does not wear cpap   Stenosis of esophagus     Past Surgical History:  Procedure Laterality Date   BILIARY BRUSHING  04/01/2018   Procedure: BILIARY BRUSHING;  Surgeon: Lemar Lofty., MD;  Location: [facility] ENDOSCOPY;  Service: Gastroenterology;;   BILIARY BRUSHING  09/12/2018   Procedure: BILIARY BRUSHING;  Surgeon: Lemar Lofty., MD;  Location: [facility] ENDOSCOPY;  Service: Gastroenterology;;   BILIARY BRUSHING  11/28/2018   Procedure: BILIARY BRUSHING;  Surgeon: Lemar Lofty., MD;  Location: [facility] ENDOSCOPY;  Service: Gastroenterology;;   BILIARY BRUSHING  03/11/2020   Procedure: BILIARY BRUSHING;  Surgeon: Lemar Lofty., MD;  Location: [facility] ENDOSCOPY;  Service:  Gastroenterology;;   BILIARY DILATION  09/12/2018   Procedure: BILIARY DILATION;  Surgeon: Lemar Lofty., MD;  Location: [facility] ENDOSCOPY;  Service: Gastroenterology;;   BILIARY DILATION  11/28/2018   Procedure: BILIARY DILATION;  Surgeon: Lemar Lofty., MD;  Location: [facility] ENDOSCOPY;  Service: Gastroenterology;;   BILIARY DILATION  03/11/2020   Procedure: BILIARY DILATION;  Surgeon: Lemar Lofty., MD;  Location: [facility] ENDOSCOPY;  Service: Gastroenterology;;   BILIARY STENT PLACEMENT  04/01/2018   Procedure: BILIARY STENT PLACEMENT;  Surgeon: Lemar Lofty., MD;  Location: [facility] ENDOSCOPY;  Service: Gastroenterology;;   BILIARY STENT PLACEMENT  09/12/2018   Procedure: BILIARY STENT PLACEMENT;  Surgeon: Lemar Lofty., MD;  Location: [facility] ENDOSCOPY;  Service: Gastroenterology;;   BILIARY STENT PLACEMENT  11/28/2018   Procedure: BILIARY STENT PLACEMENT;  Surgeon: Lemar Lofty., MD;  Location: [facility] ENDOSCOPY;  Service: Gastroenterology;;   BIOPSY  04/01/2018   Procedure: BIOPSY;  Surgeon: Lemar Lofty., MD;  Location: [facility] ENDOSCOPY;  Service: Gastroenterology;;   BIOPSY  09/12/2018   Procedure: BIOPSY;  Surgeon: Lemar Lofty., MD;  Location: [facility] ENDOSCOPY;  Service: Gastroenterology;;   BIOPSY  11/28/2018   Procedure: BIOPSY;  Surgeon: Lemar Lofty., MD;  Location: [facility] ENDOSCOPY;  Service: Gastroenterology;;   BIOPSY  03/11/2020   Procedure: BIOPSY;  Surgeon: Lemar Lofty., MD;  Location: [facility] ENDOSCOPY;  Service: Gastroenterology;;   bladder cancer      x 8 cystoscopy   CERVICAL DISCECTOMY     ACDF   CHOLECYSTECTOMY     COLONOSCOPY  11/17/2005   normal    CORONARY ARTERY BYPASS GRAFT     x4   CORONARY STENT INTERVENTION N/A 01/05/2019   Procedure: CORONARY STENT INTERVENTION;  Surgeon: End,  Cristal Deer, MD;  Location: [facility] INVASIVE CV LAB;  Service: Cardiovascular;  Laterality: N/A;   CYSTOSCOPY WITH BIOPSY N/A  03/27/2022   Procedure: CYSTOSCOPY WITH BIOPSY;  Surgeon: Bjorn Pippin, MD;  Location: [facility] ORS;  Service: Urology;  Laterality: N/A;   CYSTOSCOPY WITH RETROGRADE PYELOGRAM, URETEROSCOPY AND STENT PLACEMENT Right 03/27/2022   Procedure: CYSTOSCOPY WITH RIGHT RETROGRADE PYELOGRAM, URETEROSCOPY AND STENT PLACEMENT urethral dilation;  Surgeon: Bjorn Pippin, MD;  Location: [facility] ORS;  Service: Urology;  Laterality: Right;   ENDOSCOPIC MUCOSAL RESECTION  09/12/2018   Procedure: ENDOSCOPIC MUCOSAL RESECTION;  Surgeon: Meridee Score Netty Starring., MD;  Location: [facility] ENDOSCOPY;  Service: Gastroenterology;;   ENDOSCOPIC RETROGRADE CHOLANGIOPANCREATOGRAPHY (ERCP) WITH PROPOFOL N/A 04/01/2018   Procedure: ENDOSCOPIC RETROGRADE CHOLANGIOPANCREATOGRAPHY (ERCP) WITH PROPOFOL;  Surgeon: Lemar Lofty., MD;  Location: [facility] ENDOSCOPY;  Service: Gastroenterology;  Laterality: N/A;   ENDOSCOPIC RETROGRADE CHOLANGIOPANCREATOGRAPHY (ERCP) WITH PROPOFOL N/A 09/12/2018   Procedure: ENDOSCOPIC RETROGRADE CHOLANGIOPANCREATOGRAPHY (ERCP) WITH PROPOFOL;  Surgeon: Meridee Score Netty Starring., MD;  Location: [facility] ENDOSCOPY;  Service: Gastroenterology;  Laterality: N/A;   ENDOSCOPIC RETROGRADE CHOLANGIOPANCREATOGRAPHY (ERCP) WITH PROPOFOL N/A 03/11/2020   Procedure: ENDOSCOPIC RETROGRADE CHOLANGIOPANCREATOGRAPHY (ERCP) WITH PROPOFOL;  Surgeon: Meridee Score Netty Starring., MD;  Location: [facility] ENDOSCOPY;  Service: Gastroenterology;  Laterality: N/A;   ENDOSCOPIC RETROGRADE CHOLANGIOPANCREATOGRAPHY (ERCP) WITH PROPOFOL N/A 06/03/2020   Procedure: ENDOSCOPIC RETROGRADE CHOLANGIOPANCREATOGRAPHY (ERCP) WITH PROPOFOL;  Surgeon: Meridee Score Netty Starring., MD;  Location: [facility] ENDOSCOPY;  Service: Gastroenterology;  Laterality: N/A;   ERCP N/A 11/28/2018   Procedure: ENDOSCOPIC RETROGRADE CHOLANGIOPANCREATOGRAPHY (ERCP) +EGD with spyglass;  Surgeon: Meridee Score Netty Starring., MD;  Location: [facility] ENDOSCOPY;  Service: Gastroenterology;  Laterality: N/A;    ESOPHAGOGASTRODUODENOSCOPY  04/29/2010   ESOPHAGOGASTRODUODENOSCOPY (EGD) WITH PROPOFOL N/A 04/01/2018   Procedure: ESOPHAGOGASTRODUODENOSCOPY (EGD) WITH PROPOFOL;  Surgeon: Meridee Score Netty Starring., MD;  Location: [facility] ENDOSCOPY;  Service: Gastroenterology;  Laterality: N/A;   ESOPHAGOGASTRODUODENOSCOPY (EGD) WITH PROPOFOL N/A 09/12/2018   Procedure: ESOPHAGOGASTRODUODENOSCOPY (EGD) WITH PROPOFOL;  Surgeon: Meridee Score Netty Starring., MD;  Location: [facility] ENDOSCOPY;  Service: Gastroenterology;  Laterality: N/A;   ESOPHAGOGASTRODUODENOSCOPY (EGD) WITH PROPOFOL N/A 11/28/2018   Procedure: ESOPHAGOGASTRODUODENOSCOPY (EGD) WITH PROPOFOL;  Surgeon: Meridee Score Netty Starring., MD;  Location: [facility] ENDOSCOPY;  Service: Gastroenterology;  Laterality: N/A;   ESOPHAGOGASTRODUODENOSCOPY (EGD) WITH PROPOFOL N/A 03/11/2020   Procedure: ESOPHAGOGASTRODUODENOSCOPY (EGD) WITH PROPOFOL;  Surgeon: Meridee Score Netty Starring., MD;  Location: [facility] ENDOSCOPY;  Service: Gastroenterology;  Laterality: N/A;   ESOPHAGOGASTRODUODENOSCOPY (EGD) WITH PROPOFOL N/A 06/03/2020   Procedure: ESOPHAGOGASTRODUODENOSCOPY (EGD) WITH PROPOFOL;  Surgeon: Meridee Score Netty Starring., MD;  Location: [facility] ENDOSCOPY;  Service: Gastroenterology;  Laterality: N/A;   EUS  04/01/2018   Procedure: FULL UPPER ENDOSCOPIC ULTRASOUND (EUS) RADIAL;  Surgeon: Lemar Lofty., MD;  Location: [facility] ENDOSCOPY;  Service: Gastroenterology;;   EUS N/A 09/12/2018   Procedure: UPPER ENDOSCOPIC ULTRASOUND (EUS) RADIAL;  Surgeon: Lemar Lofty., MD;  Location: [facility] ENDOSCOPY;  Service: Gastroenterology;  Laterality: N/A;   FINE NEEDLE ASPIRATION  09/12/2018   Procedure: FINE NEEDLE ASPIRATION (FNA) LINEAR;  Surgeon: Lemar Lofty., MD;  Location: [facility] ENDOSCOPY;  Service: Gastroenterology;;   HAND SURGERY Left 2018   saw accident   HEMOSTASIS CLIP PLACEMENT  09/12/2018   Procedure: HEMOSTASIS CLIP PLACEMENT;  Surgeon: Lemar Lofty., MD;  Location: [facility]  ENDOSCOPY;  Service: Gastroenterology;;   HEMOSTASIS CLIP PLACEMENT  06/03/2020   Procedure: HEMOSTASIS CLIP PLACEMENT;  Surgeon: Lemar Lofty., MD;  Location: [facility] ENDOSCOPY;  Service: Gastroenterology;;   I & D EXTREMITY Left 11/24/2016   Procedure:  IRRIGATION AND DEBRIDEMENT LEFT HAND, THUMB, INDEX, MIDDLE, RING, AND SMALL FINGERS WITH RECONSTRUCTION;  Surgeon: Dominica Severin, MD;  Location: [facility] OR;  Service: Orthopedics;  Laterality: Left;   KNEE ARTHROSCOPY Left    LEFT HEART CATH AND CORS/GRAFTS ANGIOGRAPHY N/A 01/05/2019   Procedure: LEFT HEART CATH AND CORS/GRAFTS ANGIOGRAPHY;  Surgeon: Yvonne Kendall, MD;  Location: [facility] INVASIVE CV LAB;  Service: Cardiovascular;  Laterality: N/A;   LUMBAR LAMINECTOMY     and fusion x 2   NASAL SINUS SURGERY     POPLITEAL SYNOVIAL CYST EXCISION     REMOVAL OF STONES  04/01/2018   Procedure: REMOVAL OF STONES;  Surgeon: Meridee Score Netty Starring., MD;  Location: [facility] ENDOSCOPY;  Service: Gastroenterology;;   REMOVAL OF STONES  09/12/2018   Procedure: REMOVAL OF STONES;  Surgeon: Lemar Lofty., MD;  Location: [facility] ENDOSCOPY;  Service: Gastroenterology;;   REMOVAL OF STONES  11/28/2018   Procedure: REMOVAL OF STONES;  Surgeon: Lemar Lofty., MD;  Location: [facility] ENDOSCOPY;  Service: Gastroenterology;;   REMOVAL OF STONES  03/11/2020   Procedure: REMOVAL OF STONES;  Surgeon: Lemar Lofty., MD;  Location: [facility] ENDOSCOPY;  Service: Gastroenterology;;   REMOVAL OF STONES  06/03/2020   Procedure: REMOVAL OF STONES;  Surgeon: Lemar Lofty., MD;  Location: [facility] ENDOSCOPY;  Service: Gastroenterology;;   Gaspar Bidding DILATION N/A 09/12/2018   Procedure: Jacklyn Shell;  Surgeon: Lemar Lofty., MD;  Location: [facility] ENDOSCOPY;  Service: Gastroenterology;  Laterality: N/A;   SAVORY DILATION N/A 11/28/2018   Procedure: SAVORY DILATION;  Surgeon: Meridee Score Netty Starring., MD;  Location: [facility] ENDOSCOPY;  Service: Gastroenterology;   Laterality: N/A;   SEPTOPLASTY Bilateral 05/26/2021   Procedure: SEPTOPLASTY;  Surgeon: Newman Pies, MD;  Location: [facility];  Service: ENT;  Laterality: Bilateral;   SPHINCTEROTOMY  04/01/2018   Procedure: Dennison Mascot;  Surgeon: Mansouraty, Netty Starring., MD;  Location: [facility] ENDOSCOPY;  Service: Gastroenterology;;   Burman Freestone CHOLANGIOSCOPY N/A 11/28/2018   Procedure: ZOXWRUEA CHOLANGIOSCOPY;  Surgeon: Lemar Lofty., MD;  Location: [facility] ENDOSCOPY;  Service: Gastroenterology;  Laterality: N/A;   SPYGLASS CHOLANGIOSCOPY N/A 06/03/2020   Procedure: SPYGLASS CHOLANGIOSCOPY;  Surgeon: Lemar Lofty., MD;  Location: [facility] ENDOSCOPY;  Service: Gastroenterology;  Laterality: N/A;   STENT REMOVAL  09/12/2018   Procedure: STENT REMOVAL;  Surgeon: Lemar Lofty., MD;  Location: [facility] ENDOSCOPY;  Service: Gastroenterology;;   Francine Graven REMOVAL  11/28/2018   Procedure: STENT REMOVAL;  Surgeon: Lemar Lofty., MD;  Location: [facility] ENDOSCOPY;  Service: Gastroenterology;;   Francine Graven REMOVAL  03/11/2020   Procedure: STENT REMOVAL;  Surgeon: Lemar Lofty., MD;  Location: [facility] ENDOSCOPY;  Service: Gastroenterology;;   SUBMUCOSAL LIFTING INJECTION  09/12/2018   Procedure: SUBMUCOSAL LIFTING INJECTION;  Surgeon: Lemar Lofty., MD;  Location: [facility] ENDOSCOPY;  Service: Gastroenterology;;    No Known Allergies   Current Outpatient Medications  Medication Sig Dispense Refill   acetaminophen (TYLENOL) 500 MG tablet Take 1,000 mg by mouth every 6 (six) hours as needed for moderate pain.     amLODipine (NORVASC) 2.5 MG tablet TAKE 2 TABLETS BY MOUTH DAILY 180 tablet 3   aspirin EC 81 MG tablet Take 81 mg by mouth every evening. Swallow whole.     B-D 3CC LUER-LOK SYR 23GX1" 23G X 1" 3 ML MISC USE WITH B12 INJECTION 1 each 0   cetirizine (ZYRTEC) 10 MG tablet Take 10 mg by mouth daily as needed for allergies.     clopidogrel (PLAVIX) 75  MG tablet TAKE 1 TABLET BY MOUTH  ONCE  DAILY 60 tablet 5   Coenzyme Q10 300 MG CAPS Take 300 mg by mouth every evening.     cyanocobalamin (VITAMIN B12) 1000 MCG/ML injection INJECT 1 ML SUBCUTANEOUSLY ONCE EVERY MONTH 6 mL 11   ezetimibe (ZETIA) 10 MG tablet TAKE 1 TABLET BY MOUTH DAILY 100 tablet 1   Ferrous Sulfate (IRON PO) Take 1 tablet by mouth in the morning.     fluticasone (FLONASE) 50 MCG/ACT nasal spray Place 1 spray into both nostrils daily as needed for allergies.     isosorbide mononitrate (IMDUR) 60 MG 24 hr tablet TAKE 1 TABLET BY MOUTH DAILY 90 tablet 3   Menthol, Topical Analgesic, (BENGAY EX) Apply 1 application topically daily as needed (pain).     METAMUCIL FIBER PO Take 1 capsule by mouth in the morning.     Multiple Vitamins-Minerals (MULTIVITAMIN WITH MINERALS) tablet Take 1 tablet by mouth daily. Gummy     Naphazoline-Pheniramine (OPCON-A) 0.027-0.315 % SOLN Place 1 drop into both eyes daily as needed (itching eyes).     nebivolol (BYSTOLIC) 10 MG tablet Take 1 tablet (10 mg total) by mouth daily. Please schedule appointment for additional refills. 100 tablet 1   NEEDLE, DISP, 25 G (B-D DISP NEEDLE 25GX1") 25G X 1" MISC Inject 1000 mcg into muscle once a month. 50 each 0   nitroGLYCERIN (NITROSTAT) 0.4 MG SL tablet PLACE 1 TABLET UNDER THE TONGUE EVERY 5 MINUTES AS NEEDED FOR CHEST PAIN (Patient not taking: Reported on 04/29/2022) 25 tablet 2   oxyCODONE-acetaminophen (PERCOCET) 10-325 MG tablet Take 1 tablet by mouth 2 (two) times daily as needed for pain. 60 tablet 0   pantoprazole (PROTONIX) 40 MG tablet Take 1 tablet (40 mg total) by mouth 2 (two) times daily. 90 tablet 3   Probiotic Product (PROBIOTIC GUMMIES PO) Take 1 capsule by mouth in the morning.     rOPINIRole (REQUIP) 2 MG tablet TAKE 1 TABLET BY MOUTH AT  BEDTIME 100 tablet 2   rosuvastatin (CRESTOR) 10 MG tablet TAKE 1 TABLET BY MOUTH DAILY 60 tablet 5   sucralfate (CARAFATE) 1 GM/10ML suspension Take 10 mLs (1 g total) by mouth 2 (two)  times daily. 420 mL 1   venlafaxine (EFFEXOR) 75 MG tablet Take 1 tablet (75 mg total) by mouth daily. 90 tablet 1   No current facility-administered medications for this visit.   Socially he is widowed in January 2013. He has 3 children 6 grandchildren. 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 There is no recent tobacco use.  no alcohol.  His daughter is the wife of my patient Mr. Tye Savoy.  ROS General: Negative; No fevers, chills, or night sweats;  HEENT: Negative; No changes in vision or hearing, sinus congestion, difficulty swallowing Pulmonary: Negative; No cough, wheezing, shortness of breath, hemoptysis Cardiovascular: Negative; No chest pain, presyncope, syncope, palpatations.  Minimal shortness of breath with activity GI: GERD; recent pancreatitis and septic cholangitis GU: Recent acute kidney injury; urothelial carcinoma of the right kidney Musculoskeletal: Positive for multiple joint aches involving his legs, hips, back, neck and shoulder Hematologic/Oncology: Negative; no easy bruising, bleeding Endocrine: Negative; no heat/cold intolerance; no diabetes Neuro: Negative; no changes in balance, headaches Skin: Negative; No rashes or skin lesions Psychiatric: Negative; No behavioral problems, depression Sleep: He no longer uses CPAP with his 25 pound weight  loss.  His restless legs improved with low-dose Requip; No snoring, daytime sleepiness, hypersomnolence, bruxism, hypnogognic hallucinations, no cataplexy Other comprehensive 14 point system review is negative.   PE BP 128/62 (BP Location: Right Arm, Patient Position: Lying right side, Cuff Size: Normal)   Ht  (1.753 m)   Wt 170 lb (77.1 kg)   SpO2 91%   BMI 25.10 kg/m    Repeat blood pressure by me was 140/64  Wt Readings from Last 3 Encounters:  04/29/22 171 lb 1.6 oz (77.6 kg)  04/28/22 170 lb (77.1 kg)  04/10/22 177 lb  (80.3 kg)   General: Alert, oriented, no distress.  More frail in appearance Skin: normal turgor, no rashes, warm and dry HEENT: Normocephalic, atraumatic. Pupils equal round and reactive to light; sclera anicteric; extraocular muscles intact;  Nose without nasal septal hypertrophy Mouth/Parynx benign; Mallinpatti scale Neck: No JVD, no carotid bruits; normal carotid upstroke Lungs: clear to ausculatation and percussion; no wheezing or rales Chest wall: without tenderness to palpitation Heart: PMI not displaced, RRR, s1 s2 normal, 1/6 systolic murmur, no diastolic murmur, no rubs, gallops, thrills, or heaves Abdomen: soft, nontender; no hepatosplenomehaly, BS+; abdominal aorta nontender and not dilated by palpation. Back: no CVA tenderness Pulses 2+ Musculoskeletal: full range of motion, normal strength, no joint deformities Extremities: no clubbing cyanosis or edema, Homan's sign negative  Neurologic: grossly nonfocal; Cranial nerves grossly wnl Psychologic: Normal mood and affect  April 28, 2022  ECG (independently read by me): NSR at 65  November 19, 2021 ECG (independently read by me):  Sinus bradycardia at 59    December 05, 2020 ECG (independently read by me): Sinus bradycardia at 59, no ectopy   March 22, 2020 ECG (independently read by me): Sinus bradycardia at 59; no ectopy, normal intervals  June 2021 ECG (independently read by me): Sinus bradycardia 58 bpm.  Q wave in lead III.  QTc interval 426 ms.  No ectopy.  October 10, 2018 ECG (independently read by me): Sinus rhythm with mild sinus arrhythmia at 71 bpm.  QTc interval 465 ms.  No significant ST changes.  No ectopy.  December 13, 2017 ECG (independently read by me): NSR at 64; IRBBB; normal intervals  July 2018 ECG (independently read by me): Sinus bradycardia 53 bpm.  No ectopy.  Normal intervals.  No ST segment changes.  December 2017 ECG (independently read by me): Normal sinus rhythm at 74 bpm.  No ectopy.   Normal intervals.  No ST segment changes.  October 2016 ECG (independently read by me): Normal sinus rhythm at 61 bpm.  No ectopy.  Normal intervals.  May 2016 ECG (independently read by me): Sinus bradycardia 56 bpm.  No ectopy.  Mild RV conduction delay.  QTc interval 395  ECG (independently read by me): sinus bradycardia 56 bpm.  Mild RV conduction delay.  Borderline first-degree AV block with a PR interval at 208 ms.  No ectopy.  Prior 06/05/2013 ECG (independently read by me): Normal sinus rhythm at 63 beats per minute.  No ectopy; normal intervals.  Prior November 2014 ECG: Sinus rhythm at 68beats per minute. There is mild RV conduction delay. Intervals were normal.  LABS:     Latest Ref Rng & Units 04/29/2022    1:30 PM 03/25/2022    8:55 AM 12/17/2021   10:03 AM  BMP  Glucose 70 - 99 mg/dL 147  829  562   BUN 8 - 23 mg/dL 22  18  23  Creatinine 0.61 - 1.24 mg/dL 7.54  3.60  6.77   Sodium 135 - 145 mmol/L 145  144  141   Potassium 3.5 - 5.1 mmol/L 4.2  4.3  4.6   Chloride 98 - 111 mmol/L 107  107  104   CO2 22 - 32 mmol/L 34  28  32   Calcium 8.9 - 10.3 mg/dL 8.8  8.9  9.0       Latest Ref Rng & Units 04/29/2022    1:30 PM 12/17/2021   10:03 AM 12/09/2020    8:53 AM  Hepatic Function  Total Protein 6.5 - 8.1 g/dL 6.9  7.6  7.2   Albumin 3.5 - 5.0 g/dL 3.8  4.2  4.2   AST 15 - 41 U/L 16  15  22    ALT 0 - 44 U/L 7  6  11    Alk Phosphatase 38 - 126 U/L 57  80  83   Total Bilirubin 0.3 - 1.2 mg/dL 0.3  0.4  0.4        Latest Ref Rng & Units 04/29/2022    1:30 PM 03/25/2022    8:55 AM 12/17/2021   10:03 AM  CBC  WBC 4.0 - 10.5 K/uL 6.8  8.7  8.8   Hemoglobin 13.0 - 17.0 g/dL 03.4  03.5  24.8   Hematocrit 39.0 - 52.0 % 36.8  41.8  38.0   Platelets 150 - 400 K/uL 158  164  193.0      Lipid Panel     Component Value Date/Time   CHOL 118 12/09/2020 0853   CHOL 116 03/30/2013 1053   TRIG 104 12/09/2020 0853   TRIG 80 03/30/2013 1053   HDL 36 (L) 12/09/2020 0853    HDL 34 (L) 03/30/2013 1053   CHOLHDL 3.3 12/09/2020 0853   CHOLHDL 2.5 03/31/2018 0508   VLDL 14 03/31/2018 0508   LDLCALC 62 12/09/2020 0853   LDLCALC 66 03/30/2013 1053     RADIOLOGY: No results found.  ECHO 04/10/2119 IMPRESSIONS   1. Left ventricular ejection fraction, by estimation, is 50 to 55%. The  left ventricle has low normal function. The left ventricle has no regional  wall motion abnormalities. Left ventricular diastolic parameters are  consistent with Grade II diastolic  dysfunction (pseudonormalization).   2. Right ventricular systolic function is normal. The right ventricular  size is normal. There is moderately elevated pulmonary artery systolic  pressure.   3. Left atrial size was moderately dilated.   4. The mitral valve is normal in structure. Trivial mitral valve  regurgitation. No evidence of mitral stenosis.   5. The aortic valve is grossly normal. Aortic valve regurgitation is  trivial. No aortic stenosis is present.   IMPRESSION:  1. Preop cardiovascular exam   2. Coronary artery disease involving coronary bypass graft of native heart without angina pectoris   3. Hx of CABG   4. Essential hypertension   5. Hyperlipidemia LDL goal <70   6. Renal cancer, right     ASSESSMENT AND PLAN:  Mr. Mailhot is a 82 year old Caucasian male who has known CAD and is 23 years status post  CABG revascularization surgery in 2001. His catheterization in 2004 for recurrent chest pain showed patent grafts. His ejection fraction of 40-45% was documented in August 2012.  In October 2018 an echo revealed an EF of 60 to 65% with grade 2 diastolic dysfunction, aortic valve sclerosis without stenosis mild left atrial dilation.  An echo in  December 2019 continued to show an EF of 60 to 65%.  There was now grade 1 diastolic dysfunction.  There was mild aortic stenosis. A Lexiscan Myoview study on January 04, 2018 was low risk and showed an EF of 51% without ischemia.  He  developed  pancreatitis and felt to have acsending cholangitis with E. coli sepsis.  He underwent sphincterotomy and stent placement in his bile duct by Dr. Corliss ParishGabriel Mansouraty.  He continued to have issues with significant shortness of breath which is stable but chronic.  His last catheterization from January 05, 2019  showed a 99% subtotally occluded vein graft to his right PDA/PL vessel which was treated successfully with 3 overlapping Resolute Onyx DES stents.  It was recommended that he continue aspirin/Plavix indefinitely.  His last echo Doppler study was in March 2021 which showed low normal LV function with EF 50 to 55% without wall motion abnormalities.  There was grade 2 diastolic dysfunction and moderate left atrial dilatation.  Presently, he has been stable cardiovascularly and has not had any angina.  He does admit to shortness of breath with walking.  His blood pressure today is stable and on repeat by me was 118/66 on his regimen of amlodipine 5 mg, isosorbide 60 mg, and nebivolol 10 mg.  He continues to be on combination therapy with Zetia and rosuvastatin.  He has developed renal CA and is scheduled to undergo robot assisted laparoscopic nephroureterectomy by Dr. Timoteo GaulManiny on Jun 03, 2022.  I am recommending that he undergo a preoperative Lexiscan Myoview study for restratification and to make certain there is no high-grade potential ischemia.  I am also recommending he undergo a preoperative 2D echo Doppler study.  He will need to hold clopidogrel for 5 days prior to surgery.  I will see him in early May for follow-up evaluation of the above studies and further recommendations will be made at that time.    Lennette Biharihomas A. Vishwa Dais, MD, [facility] CenterFACC  05/05/2022 7:52 PM

## 2022-04-29 ENCOUNTER — Encounter: Payer: Self-pay | Admitting: Internal Medicine

## 2022-04-29 ENCOUNTER — Inpatient Hospital Stay: Payer: Medicare Other

## 2022-04-29 ENCOUNTER — Other Ambulatory Visit: Payer: Self-pay

## 2022-04-29 ENCOUNTER — Inpatient Hospital Stay: Payer: Medicare Other | Attending: Internal Medicine | Admitting: Internal Medicine

## 2022-04-29 ENCOUNTER — Other Ambulatory Visit: Payer: Medicare Other

## 2022-04-29 ENCOUNTER — Ambulatory Visit: Payer: Medicare Other | Admitting: Internal Medicine

## 2022-04-29 VITALS — BP 140/66 | HR 56 | Temp 98.7°F | Resp 14 | Ht 67.72 in | Wt 171.1 lb

## 2022-04-29 DIAGNOSIS — C680 Malignant neoplasm of urethra: Secondary | ICD-10-CM

## 2022-04-29 DIAGNOSIS — C641 Malignant neoplasm of right kidney, except renal pelvis: Secondary | ICD-10-CM

## 2022-04-29 DIAGNOSIS — C679 Malignant neoplasm of bladder, unspecified: Secondary | ICD-10-CM | POA: Diagnosis not present

## 2022-04-29 DIAGNOSIS — I2510 Atherosclerotic heart disease of native coronary artery without angina pectoris: Secondary | ICD-10-CM | POA: Diagnosis not present

## 2022-04-29 DIAGNOSIS — I10 Essential (primary) hypertension: Secondary | ICD-10-CM | POA: Diagnosis not present

## 2022-04-29 DIAGNOSIS — Z800 Family history of malignant neoplasm of digestive organs: Secondary | ICD-10-CM | POA: Diagnosis not present

## 2022-04-29 DIAGNOSIS — Z87891 Personal history of nicotine dependence: Secondary | ICD-10-CM | POA: Diagnosis not present

## 2022-04-29 DIAGNOSIS — E119 Type 2 diabetes mellitus without complications: Secondary | ICD-10-CM | POA: Diagnosis not present

## 2022-04-29 DIAGNOSIS — Z808 Family history of malignant neoplasm of other organs or systems: Secondary | ICD-10-CM

## 2022-04-29 DIAGNOSIS — R531 Weakness: Secondary | ICD-10-CM

## 2022-04-29 LAB — CBC WITH DIFFERENTIAL/PLATELET
Abs Immature Granulocytes: 0.02 10*3/uL (ref 0.00–0.07)
Basophils Absolute: 0.1 10*3/uL (ref 0.0–0.1)
Basophils Relative: 1 %
Eosinophils Absolute: 0.5 10*3/uL (ref 0.0–0.5)
Eosinophils Relative: 8 %
HCT: 36.8 % — ABNORMAL LOW (ref 39.0–52.0)
Hemoglobin: 12.5 g/dL — ABNORMAL LOW (ref 13.0–17.0)
Immature Granulocytes: 0 %
Lymphocytes Relative: 30 %
Lymphs Abs: 2.1 10*3/uL (ref 0.7–4.0)
MCH: 29.3 pg (ref 26.0–34.0)
MCHC: 34 g/dL (ref 30.0–36.0)
MCV: 86.4 fL (ref 80.0–100.0)
Monocytes Absolute: 0.6 10*3/uL (ref 0.1–1.0)
Monocytes Relative: 9 %
Neutro Abs: 3.6 10*3/uL (ref 1.7–7.7)
Neutrophils Relative %: 52 %
Platelets: 158 10*3/uL (ref 150–400)
RBC: 4.26 MIL/uL (ref 4.22–5.81)
RDW: 13.9 % (ref 11.5–15.5)
WBC: 6.8 10*3/uL (ref 4.0–10.5)
nRBC: 0 % (ref 0.0–0.2)

## 2022-04-29 LAB — COMPREHENSIVE METABOLIC PANEL
ALT: 7 U/L (ref 0–44)
AST: 16 U/L (ref 15–41)
Albumin: 3.8 g/dL (ref 3.5–5.0)
Alkaline Phosphatase: 57 U/L (ref 38–126)
Anion gap: 4 — ABNORMAL LOW (ref 5–15)
BUN: 22 mg/dL (ref 8–23)
CO2: 34 mmol/L — ABNORMAL HIGH (ref 22–32)
Calcium: 8.8 mg/dL — ABNORMAL LOW (ref 8.9–10.3)
Chloride: 107 mmol/L (ref 98–111)
Creatinine, Ser: 1.71 mg/dL — ABNORMAL HIGH (ref 0.61–1.24)
GFR, Estimated: 40 mL/min — ABNORMAL LOW (ref >60)
Glucose, Bld: 159 mg/dL — ABNORMAL HIGH (ref 70–99)
Potassium: 4.2 mmol/L (ref 3.5–5.1)
Sodium: 145 mmol/L (ref 135–145)
Total Bilirubin: 0.3 mg/dL (ref 0.3–1.2)
Total Protein: 6.9 g/dL (ref 6.5–8.1)

## 2022-04-29 NOTE — Progress Notes (Signed)
[facility] [phone]   [phone]  CONSULT NOTE  REFERRING PHYSICIAN: Dr. Bjorn Pippin  REASON FOR CONSULTATION:  82 years old white male with urothelial carcinoma  HPI [name] is a 82 y.o. male with past medical history significant for multiple medical problems including history of coronary artery disease, COPD, hypertension, diabetes mellitus, depression, myocardial infarction, pneumonia, sleep apnea, anxiety, seasonal allergy and several back surgery.  The patient was seen by urology since 1990s when he was found to have superficial and recurrent bladder cancer status post several surgical resection and BCG induction the most recently was performed in 2009.  He has been followed by Dr. [name] then Dr. Isabel Caprice and most recently Dr. Annabell Howells.  He had frequent cystoscopy and evaluation over the last 20 years.  Most recent cystoscopy and ureteroscopy performed by Dr. Annabell Howells on March 27, 2022 showed detach high-grade papillary urothelial carcinoma.  The patient also has a history of a stage IIIa renal insufficiency as well as bulbar urethral stricture.  He was referred to Dr. Kathrynn Running for consideration of surgical resection which is scheduled for Jun 03, 2022 but he was also referred to me today for evaluation and recommendation regarding his condition. When seen today he continues to have generalized weakness and fatigue especially in the lower extremities with pain from the back with radiation to the front.  He has several back surgeries in the past.  He also has inability to raise his arm above the shoulder.  He continues to have shortness of breath with exertion but no significant chest pain, cough or hemoptysis.  He has no nausea, vomiting, diarrhea or constipation.  He has no headache or visual changes. Family history significant for mother with melanoma and father died from stroke.  He also has a brother with liver cancer. The patient is a widow and  has 3 daughters.  He was accompanied by his daughter [name] who is a Engineer, civil (consulting) at the [employer].  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  He has a history for smoking but quit 4 years ago.  He has no history of alcohol or drug abuse.  HPI  Past Medical History:  Diagnosis Date   Allergy    Anxiety    B12 deficiency anemia    Blood transfusion without reported diagnosis    CAD (coronary artery disease)    Cancer    bladder-    Colon polyps    COPD (chronic obstructive pulmonary disease)    Depression    Diabetes mellitus    Dyspnea    Esophagus, Barrett's    GERD (gastroesophageal reflux disease)    Headache    History of bladder cancer    Bladder cancer "8 times"   History of hiatal hernia    Hyperlipidemia    Hypertension    Localized osteoarthrosis, lower leg    Myocardial infarction 2021   Pneumonia    Restless leg syndrome    Sleep apnea    does not wear cpap   Stenosis of esophagus     Past Surgical History:  Procedure Laterality Date   BILIARY BRUSHING  04/01/2018   Procedure: BILIARY BRUSHING;  Surgeon: Lemar Lofty., MD;  Location: [facility] ENDOSCOPY;  Service: Gastroenterology;;   BILIARY BRUSHING  09/12/2018   Procedure: BILIARY BRUSHING;  Surgeon: Lemar Lofty., MD;  Location: [facility] ENDOSCOPY;  Service: Gastroenterology;;   BILIARY BRUSHING  11/28/2018  Procedure: BILIARY BRUSHING;  Surgeon: Meridee Score Netty Starring., MD;  Location: [facility] ENDOSCOPY;  Service: Gastroenterology;;   BILIARY BRUSHING  03/11/2020   Procedure: BILIARY BRUSHING;  Surgeon: Lemar Lofty., MD;  Location: [facility] ENDOSCOPY;  Service: Gastroenterology;;   BILIARY DILATION  09/12/2018   Procedure: BILIARY DILATION;  Surgeon: Lemar Lofty., MD;  Location: [facility] ENDOSCOPY;  Service: Gastroenterology;;   BILIARY DILATION  11/28/2018   Procedure: BILIARY DILATION;  Surgeon: Lemar Lofty., MD;  Location: [facility]  ENDOSCOPY;  Service: Gastroenterology;;   BILIARY DILATION  03/11/2020   Procedure: BILIARY DILATION;  Surgeon: Lemar Lofty., MD;  Location: [facility] ENDOSCOPY;  Service: Gastroenterology;;   BILIARY STENT PLACEMENT  04/01/2018   Procedure: BILIARY STENT PLACEMENT;  Surgeon: Lemar Lofty., MD;  Location: [facility] ENDOSCOPY;  Service: Gastroenterology;;   BILIARY STENT PLACEMENT  09/12/2018   Procedure: BILIARY STENT PLACEMENT;  Surgeon: Lemar Lofty., MD;  Location: [facility] ENDOSCOPY;  Service: Gastroenterology;;   BILIARY STENT PLACEMENT  11/28/2018   Procedure: BILIARY STENT PLACEMENT;  Surgeon: Lemar Lofty., MD;  Location: [facility] ENDOSCOPY;  Service: Gastroenterology;;   BIOPSY  04/01/2018   Procedure: BIOPSY;  Surgeon: Lemar Lofty., MD;  Location: [facility] ENDOSCOPY;  Service: Gastroenterology;;   BIOPSY  09/12/2018   Procedure: BIOPSY;  Surgeon: Lemar Lofty., MD;  Location: [facility] ENDOSCOPY;  Service: Gastroenterology;;   BIOPSY  11/28/2018   Procedure: BIOPSY;  Surgeon: Lemar Lofty., MD;  Location: [facility] ENDOSCOPY;  Service: Gastroenterology;;   BIOPSY  03/11/2020   Procedure: BIOPSY;  Surgeon: Lemar Lofty., MD;  Location: [facility] ENDOSCOPY;  Service: Gastroenterology;;   bladder cancer      x 8 cystoscopy   CERVICAL DISCECTOMY     ACDF   CHOLECYSTECTOMY     COLONOSCOPY  11/17/2005   normal    CORONARY ARTERY BYPASS GRAFT     x4   CORONARY STENT INTERVENTION N/A 01/05/2019   Procedure: CORONARY STENT INTERVENTION;  Surgeon: Yvonne Kendall, MD;  Location: [facility] INVASIVE CV LAB;  Service: Cardiovascular;  Laterality: N/A;   CYSTOSCOPY WITH BIOPSY N/A 03/27/2022   Procedure: CYSTOSCOPY WITH BIOPSY;  Surgeon: Bjorn Pippin, MD;  Location: [facility] ORS;  Service: Urology;  Laterality: N/A;   CYSTOSCOPY WITH RETROGRADE PYELOGRAM, URETEROSCOPY AND STENT PLACEMENT Right 03/27/2022   Procedure: CYSTOSCOPY WITH RIGHT RETROGRADE PYELOGRAM, URETEROSCOPY AND  STENT PLACEMENT urethral dilation;  Surgeon: Bjorn Pippin, MD;  Location: [facility] ORS;  Service: Urology;  Laterality: Right;   ENDOSCOPIC MUCOSAL RESECTION  09/12/2018   Procedure: ENDOSCOPIC MUCOSAL RESECTION;  Surgeon: Meridee Score Netty Starring., MD;  Location: [facility] ENDOSCOPY;  Service: Gastroenterology;;   ENDOSCOPIC RETROGRADE CHOLANGIOPANCREATOGRAPHY (ERCP) WITH PROPOFOL N/A 04/01/2018   Procedure: ENDOSCOPIC RETROGRADE CHOLANGIOPANCREATOGRAPHY (ERCP) WITH PROPOFOL;  Surgeon: Lemar Lofty., MD;  Location: [facility] ENDOSCOPY;  Service: Gastroenterology;  Laterality: N/A;   ENDOSCOPIC RETROGRADE CHOLANGIOPANCREATOGRAPHY (ERCP) WITH PROPOFOL N/A 09/12/2018   Procedure: ENDOSCOPIC RETROGRADE CHOLANGIOPANCREATOGRAPHY (ERCP) WITH PROPOFOL;  Surgeon: Meridee Score Netty Starring., MD;  Location: [facility] ENDOSCOPY;  Service: Gastroenterology;  Laterality: N/A;   ENDOSCOPIC RETROGRADE CHOLANGIOPANCREATOGRAPHY (ERCP) WITH PROPOFOL N/A 03/11/2020   Procedure: ENDOSCOPIC RETROGRADE CHOLANGIOPANCREATOGRAPHY (ERCP) WITH PROPOFOL;  Surgeon: Meridee Score Netty Starring., MD;  Location: [facility] ENDOSCOPY;  Service: Gastroenterology;  Laterality: N/A;   ENDOSCOPIC RETROGRADE CHOLANGIOPANCREATOGRAPHY (ERCP) WITH PROPOFOL N/A 06/03/2020   Procedure: ENDOSCOPIC RETROGRADE CHOLANGIOPANCREATOGRAPHY (ERCP) WITH PROPOFOL;  Surgeon: Meridee Score Netty Starring., MD;  Location: [facility] ENDOSCOPY;  Service: Gastroenterology;  Laterality: N/A;   ERCP N/A 11/28/2018   Procedure: ENDOSCOPIC  RETROGRADE CHOLANGIOPANCREATOGRAPHY (ERCP) +EGD with spyglass;  Surgeon: Mansouraty, Netty Starring., MD;  Location: [facility] ENDOSCOPY;  Service: Gastroenterology;  Laterality: N/A;   ESOPHAGOGASTRODUODENOSCOPY  04/29/2010   ESOPHAGOGASTRODUODENOSCOPY (EGD) WITH PROPOFOL N/A 04/01/2018   Procedure: ESOPHAGOGASTRODUODENOSCOPY (EGD) WITH PROPOFOL;  Surgeon: Meridee Score Netty Starring., MD;  Location: [facility] ENDOSCOPY;  Service: Gastroenterology;  Laterality: N/A;   ESOPHAGOGASTRODUODENOSCOPY  (EGD) WITH PROPOFOL N/A 09/12/2018   Procedure: ESOPHAGOGASTRODUODENOSCOPY (EGD) WITH PROPOFOL;  Surgeon: Meridee Score Netty Starring., MD;  Location: [facility] ENDOSCOPY;  Service: Gastroenterology;  Laterality: N/A;   ESOPHAGOGASTRODUODENOSCOPY (EGD) WITH PROPOFOL N/A 11/28/2018   Procedure: ESOPHAGOGASTRODUODENOSCOPY (EGD) WITH PROPOFOL;  Surgeon: Meridee Score Netty Starring., MD;  Location: [facility] ENDOSCOPY;  Service: Gastroenterology;  Laterality: N/A;   ESOPHAGOGASTRODUODENOSCOPY (EGD) WITH PROPOFOL N/A 03/11/2020   Procedure: ESOPHAGOGASTRODUODENOSCOPY (EGD) WITH PROPOFOL;  Surgeon: Meridee Score Netty Starring., MD;  Location: [facility] ENDOSCOPY;  Service: Gastroenterology;  Laterality: N/A;   ESOPHAGOGASTRODUODENOSCOPY (EGD) WITH PROPOFOL N/A 06/03/2020   Procedure: ESOPHAGOGASTRODUODENOSCOPY (EGD) WITH PROPOFOL;  Surgeon: Meridee Score Netty Starring., MD;  Location: [facility] ENDOSCOPY;  Service: Gastroenterology;  Laterality: N/A;   EUS  04/01/2018   Procedure: FULL UPPER ENDOSCOPIC ULTRASOUND (EUS) RADIAL;  Surgeon: Lemar Lofty., MD;  Location[facility] ENDOSCOPY;  Service: Gastroenterology;;   EUS N/A 09/12/2018   Procedure: UPPER ENDOSCOPIC ULTRASOUND (EUS) RADIAL;  Surgeon: Lemar Lofty., MD;  Location: [facility] ENDOSCOPY;  Service: Gastroenterology;  Laterality: N/A;   FINE NEEDLE ASPIRATION  09/12/2018   Procedure: FINE NEEDLE ASPIRATION (FNA) LINEAR;  Surgeon: Lemar Lofty., MD;  Location: [facility] ENDOSCOPY;  Service: Gastroenterology;;   HAND SURGERY Left 2018   saw accident   HEMOSTASIS CLIP PLACEMENT  09/12/2018   Procedure: HEMOSTASIS CLIP PLACEMENT;  Surgeon: Lemar Lofty., MD;  Location: [facility] ENDOSCOPY;  Service: Gastroenterology;;   HEMOSTASIS CLIP PLACEMENT  06/03/2020   Procedure: HEMOSTASIS CLIP PLACEMENT;  Surgeon: Lemar Lofty., MD;  Location: [facility] ENDOSCOPY;  Service: Gastroenterology;;   I & D EXTREMITY Left 11/24/2016   Procedure: IRRIGATION AND DEBRIDEMENT LEFT HAND, THUMB,  INDEX, MIDDLE, RING, AND SMALL FINGERS WITH RECONSTRUCTION;  Surgeon: Dominica Severin, MD;  Location: [facility] OR;  Service: Orthopedics;  Laterality: Left;   KNEE ARTHROSCOPY Left    LEFT HEART CATH AND CORS/GRAFTS ANGIOGRAPHY N/A 01/05/2019   Procedure: LEFT HEART CATH AND CORS/GRAFTS ANGIOGRAPHY;  Surgeon: Yvonne Kendall, MD;  Location: [facility] INVASIVE CV LAB;  Service: Cardiovascular;  Laterality: N/A;   LUMBAR LAMINECTOMY     and fusion x 2   NASAL SINUS SURGERY     POPLITEAL SYNOVIAL CYST EXCISION     REMOVAL OF STONES  04/01/2018   Procedure: REMOVAL OF STONES;  Surgeon: Meridee Score Netty Starring., MD;  Location: [facility] ENDOSCOPY;  Service: Gastroenterology;;   REMOVAL OF STONES  09/12/2018   Procedure: REMOVAL OF STONES;  Surgeon: Lemar Lofty., MD;  Location: [facility] ENDOSCOPY;  Service: Gastroenterology;;   REMOVAL OF STONES  11/28/2018   Procedure: REMOVAL OF STONES;  Surgeon: Lemar Lofty., MD;  Location: [facility] ENDOSCOPY;  Service: Gastroenterology;;   REMOVAL OF STONES  03/11/2020   Procedure: REMOVAL OF STONES;  Surgeon: Lemar Lofty., MD;  Location: [facility] ENDOSCOPY;  Service: Gastroenterology;;   REMOVAL OF STONES  06/03/2020   Procedure: REMOVAL OF STONES;  Surgeon: Lemar Lofty., MD;  Location: [facility] ENDOSCOPY;  Service: Gastroenterology;;   Gaspar Bidding DILATION N/A 09/12/2018   Procedure: Jacklyn Shell;  Surgeon: Lemar Lofty., MD;  Location: [facility] ENDOSCOPY;  Service: Gastroenterology;  Laterality: N/A;   SAVORY DILATION  N/A 11/28/2018   Procedure: SAVORY DILATION;  Surgeon: Lemar Lofty., MD;  Location: [facility] ENDOSCOPY;  Service: Gastroenterology;  Laterality: N/A;   SEPTOPLASTY Bilateral 05/26/2021   Procedure: SEPTOPLASTY;  Surgeon: Newman Pies, MD;  Location: [facility];  Service: ENT;  Laterality: Bilateral;   SPHINCTEROTOMY  04/01/2018   Procedure: Dennison Mascot;  Surgeon: Mansouraty, Netty Starring., MD;  Location: [facility] ENDOSCOPY;   Service: Gastroenterology;;   Burman Freestone CHOLANGIOSCOPY N/A 11/28/2018   Procedure: ZOXWRUEA CHOLANGIOSCOPY;  Surgeon: Lemar Lofty., MD;  Location: [facility] ENDOSCOPY;  Service: Gastroenterology;  Laterality: N/A;   SPYGLASS CHOLANGIOSCOPY N/A 06/03/2020   Procedure: SPYGLASS CHOLANGIOSCOPY;  Surgeon: Lemar Lofty., MD;  Location: [facility] ENDOSCOPY;  Service: Gastroenterology;  Laterality: N/A;   STENT REMOVAL  09/12/2018   Procedure: STENT REMOVAL;  Surgeon: Lemar Lofty., MD;  Location: [facility] ENDOSCOPY;  Service: Gastroenterology;;   Francine Graven REMOVAL  11/28/2018   Procedure: STENT REMOVAL;  Surgeon: Lemar Lofty., MD;  Location: [facility] ENDOSCOPY;  Service: Gastroenterology;;   Francine Graven REMOVAL  03/11/2020   Procedure: STENT REMOVAL;  Surgeon: Lemar Lofty., MD;  Location: [facility] ENDOSCOPY;  Service: Gastroenterology;;   SUBMUCOSAL LIFTING INJECTION  09/12/2018   Procedure: SUBMUCOSAL LIFTING INJECTION;  Surgeon: Lemar Lofty., MD;  Location: [facility] ENDOSCOPY;  Service: Gastroenterology;;    Family History  Problem Relation Age of Onset   Melanoma Mother    Stroke Father    Hypertension Father    Coronary artery disease Other    Colon cancer Neg Hx    Esophageal cancer Neg Hx    Stomach cancer Neg Hx    Rectal cancer Neg Hx    Pancreatic cancer Neg Hx    Liver disease Neg Hx    Inflammatory bowel disease Neg Hx     Social History Social History   Tobacco Use   Smoking status: Former    Packs/day: 0.50    Years: 5.00    Additional pack years: 0.00    Total pack years: 2.50    Types: Cigarettes    Quit date: 03/30/1976    Years since quitting: 46.1   Smokeless tobacco: Never  Vaping Use   Vaping Use: Never used  Substance Use Topics   Alcohol use: No    Alcohol/week: 0.0 standard drinks of alcohol   Drug use: No    No Known Allergies  Current Outpatient Medications  Medication Sig Dispense Refill   acetaminophen (TYLENOL) 500 MG tablet  Take 1,000 mg by mouth every 6 (six) hours as needed for moderate pain.     amLODipine (NORVASC) 2.5 MG tablet TAKE 2 TABLETS BY MOUTH DAILY 180 tablet 3   aspirin EC 81 MG tablet Take 81 mg by mouth every evening. Swallow whole.     B-D 3CC LUER-LOK SYR 23GX1" 23G X 1" 3 ML MISC USE WITH B12 INJECTION 1 each 0   cetirizine (ZYRTEC) 10 MG tablet Take 10 mg by mouth daily as needed for allergies.     clopidogrel (PLAVIX) 75 MG tablet TAKE 1 TABLET BY MOUTH ONCE  DAILY 60 tablet 5   Coenzyme Q10 300 MG CAPS Take 300 mg by mouth every evening.     cyanocobalamin (VITAMIN B12) 1000 MCG/ML injection INJECT 1 ML SUBCUTANEOUSLY ONCE EVERY MONTH 6 mL 11   ezetimibe (ZETIA) 10 MG tablet TAKE 1 TABLET BY MOUTH DAILY 100 tablet 1   Ferrous Sulfate (IRON PO) Take 1 tablet by mouth in the morning.  fluticasone (FLONASE) 50 MCG/ACT nasal spray Place 1 spray into both nostrils daily as needed for allergies.     isosorbide mononitrate (IMDUR) 60 MG 24 hr tablet TAKE 1 TABLET BY MOUTH DAILY 90 tablet 3   Menthol, Topical Analgesic, (BENGAY EX) Apply 1 application topically daily as needed (pain).     METAMUCIL FIBER PO Take 1 capsule by mouth in the morning.     Multiple Vitamins-Minerals (MULTIVITAMIN WITH MINERALS) tablet Take 1 tablet by mouth daily. Gummy     Naphazoline-Pheniramine (OPCON-A) 0.027-0.315 % SOLN Place 1 drop into both eyes daily as needed (itching eyes).     nebivolol (BYSTOLIC) 10 MG tablet Take 1 tablet (10 mg total) by mouth daily. Please schedule appointment for additional refills. 100 tablet 1   NEEDLE, DISP, 25 G (B-D DISP NEEDLE 25GX1") 25G X 1" MISC Inject 1000 mcg into muscle once a month. 50 each 0   nitroGLYCERIN (NITROSTAT) 0.4 MG SL tablet PLACE 1 TABLET UNDER THE TONGUE EVERY 5 MINUTES AS NEEDED FOR CHEST PAIN 25 tablet 2   oxyCODONE-acetaminophen (PERCOCET) 10-325 MG tablet Take 1 tablet by mouth 2 (two) times daily as needed for pain. 60 tablet 0   pantoprazole (PROTONIX) 40  MG tablet Take 1 tablet (40 mg total) by mouth 2 (two) times daily. 90 tablet 3   Probiotic Product (PROBIOTIC ADVANCED PO) Take 1 tablet by mouth every evening. Capsule     Probiotic Product (PROBIOTIC GUMMIES PO) Take 1 capsule by mouth in the morning.     rOPINIRole (REQUIP) 2 MG tablet TAKE 1 TABLET BY MOUTH AT  BEDTIME 100 tablet 2   rosuvastatin (CRESTOR) 10 MG tablet TAKE 1 TABLET BY MOUTH DAILY 60 tablet 5   sucralfate (CARAFATE) 1 GM/10ML suspension Take 10 mLs (1 g total) by mouth 2 (two) times daily. 420 mL 1   venlafaxine (EFFEXOR) 75 MG tablet Take 1 tablet (75 mg total) by mouth daily. 90 tablet 1   No current facility-administered medications for this visit.    Review of Systems  Constitutional: positive for fatigue Eyes: negative Ears, nose, mouth, throat, and face: negative Respiratory: positive for dyspnea on exertion Cardiovascular: negative Gastrointestinal: negative Genitourinary:negative Integument/breast: negative Hematologic/lymphatic: negative Musculoskeletal:positive for back pain and muscle weakness Neurological: negative Behavioral/Psych: negative Endocrine: negative Allergic/Immunologic: negative  Physical Exam  UJW:JXBJY, healthy, no distress, well nourished, and well developed SKIN: skin color, texture, turgor are normal, no rashes or significant lesions HEAD: Normocephalic, No masses, lesions, tenderness or abnormalities EYES: normal, PERRLA, Conjunctiva are pink and non-injected EARS: External ears normal, Canals clear OROPHARYNX:no exudate, no erythema, and lips, buccal mucosa, and tongue normal  NECK: supple, no adenopathy, no JVD LYMPH:  no palpable lymphadenopathy, no hepatosplenomegaly LUNGS: clear to auscultation , and palpation HEART: regular rate & rhythm, no murmurs, and no gallops ABDOMEN:abdomen soft, non-tender, normal bowel sounds, and no masses or organomegaly BACK: Back symmetric, no curvature., No CVA tenderness EXTREMITIES:no  joint deformities, effusion, or inflammation, no edema  NEURO: alert & oriented x 3 with fluent speech, no focal motor/sensory deficits  PERFORMANCE STATUS: ECOG 1  LABORATORY DATA: Lab Results  Component Value Date   WBC 6.8 04/29/2022   HGB 12.5 (L) 04/29/2022   HCT 36.8 (L) 04/29/2022   MCV 86.4 04/29/2022   PLT 158 04/29/2022      Chemistry      Component Value Date/Time   NA 145 04/29/2022 1330   NA 144 12/09/2020 0853   K 4.2 04/29/2022 1330  CL 107 04/29/2022 1330   CO2 34 (H) 04/29/2022 1330   BUN 22 04/29/2022 1330   BUN 21 12/09/2020 0853   CREATININE 1.71 (H) 04/29/2022 1330   CREATININE 1.61 (H) 11/09/2019 0939      Component Value Date/Time   CALCIUM 8.8 (L) 04/29/2022 1330   ALKPHOS 57 04/29/2022 1330   AST 16 04/29/2022 1330   ALT 7 04/29/2022 1330   BILITOT 0.3 04/29/2022 1330   BILITOT 0.4 12/09/2020 0853       RADIOGRAPHIC STUDIES: No results found.  ASSESSMENT: This is a very pleasant 82 years old white male with recurrent papillary urothelial carcinoma, high-grade involving the right upper pole of the kidney.  He has a diagnosis of urothelial carcinoma for more than 25 years.  He was treated in the past with BCG as well as surgical resection of the superficial bladder cancer.   PLAN: I had a lengthy discussion with the patient and his daughter today about his current disease condition and potential treatment options. Looking at the NCCN guidelines there is no indication for neoadjuvant treatment except for consideration of treatment with [facility] for unresectable high-grade patient. The patient is scheduled to have surgical resection of the right kidney and bladder on Jun 03, 2022 under the care of Dr. Berneice HeinrichManny. I recommended for him to proceed with the surgery as planned and I will see him back for follow-up visit 1 month after the surgery for evaluation and consideration of any adjuvant treatment if needed. For the back pain and weakness,  He was  advised to follow-up with his orthopedic surgery after the kidney surgery. The patient was advised to call immediately if he has any other concerning symptoms in the interval. The patient voices understanding of current disease status and treatment options and is in agreement with the current care plan.  All questions were answered. The patient knows to call the clinic with any problems, questions or concerns. We can certainly see the patient much sooner if necessary.  Thank you so much for allowing me to participate in the care of [name]. I will continue to follow up the patient with you and assist in his care.  The total time spent in the appointment was 60 minutes.  Disclaimer: This note was dictated with voice recognition software. Similar sounding words can inadvertently be transcribed and may not be corrected upon review.   Lajuana MatteMohamed K Demetris Meinhardt April 29, 2022, 2:24 PM

## 2022-05-01 NOTE — Telephone Encounter (Signed)
Dr. Tresa Endo,  You saw this patient on 04/28/2022. Will you please comment on medical clearance for right nephroureterectomy scheduled for 06/03/2022?  Please route your response to P CV DIV Preop. I will communicate with requesting office once you have given recommendations.   Thank you!  Carlos Levering, NP

## 2022-05-04 ENCOUNTER — Telehealth (HOSPITAL_COMMUNITY): Payer: Self-pay | Admitting: *Deleted

## 2022-05-04 NOTE — Telephone Encounter (Signed)
Left message on voicemail per DPR in reference to upcoming appointment scheduled on 05/12/22  with detailed instructions given per Myocardial Perfusion Study Information Sheet for the test. LM to arrive 15 minutes early, and that it is imperative to arrive on time for appointment to keep from having the test rescheduled. If you need to cancel or reschedule your appointment, please call the office within 24 hours of your appointment. Failure to do so may result in a cancellation of your appointment, and a $50 no show fee. Phone number given for call back for any questions. Ricky Ala, RN

## 2022-05-05 ENCOUNTER — Encounter: Payer: Self-pay | Admitting: Cardiovascular Disease

## 2022-05-05 NOTE — Telephone Encounter (Signed)
Left message to call back to schedule a tele pre op appt. Pt has echo 05/12/22, tele appt should be 1-2 days after echo so that we may have results available if needed for pre op.

## 2022-05-06 NOTE — Telephone Encounter (Signed)
See notes from Dr. Tresa Endo. Pt has a f/u appt with Bernadene Person, NP 05/27/22. I will add pre op clearance needed to appt notes. I will update all parties involved.

## 2022-05-06 NOTE — Telephone Encounter (Signed)
I just saw patient on April 28, 2022.  I scheduled him for 2D echo Doppler study and a Lexiscan Myoview for preoperative assessment prior to his planned surgery.  He has a follow-up appointment scheduled after the above studies

## 2022-05-12 ENCOUNTER — Ambulatory Visit (HOSPITAL_COMMUNITY): Payer: Medicare Other

## 2022-05-12 ENCOUNTER — Other Ambulatory Visit: Payer: Self-pay | Admitting: Internal Medicine

## 2022-05-12 ENCOUNTER — Ambulatory Visit (HOSPITAL_COMMUNITY): Payer: Medicare Other | Attending: Cardiovascular Disease

## 2022-05-12 DIAGNOSIS — I25810 Atherosclerosis of coronary artery bypass graft(s) without angina pectoris: Secondary | ICD-10-CM | POA: Insufficient documentation

## 2022-05-12 DIAGNOSIS — Z01810 Encounter for preprocedural cardiovascular examination: Secondary | ICD-10-CM | POA: Diagnosis not present

## 2022-05-12 DIAGNOSIS — Z951 Presence of aortocoronary bypass graft: Secondary | ICD-10-CM | POA: Insufficient documentation

## 2022-05-12 DIAGNOSIS — I10 Essential (primary) hypertension: Secondary | ICD-10-CM | POA: Diagnosis not present

## 2022-05-12 LAB — ECHOCARDIOGRAM COMPLETE
Area-P 1/2: 3.37 cm2
S' Lateral: 4 cm

## 2022-05-12 MED ORDER — PERFLUTREN LIPID MICROSPHERE
1.0000 mL | INTRAVENOUS | Status: AC | PRN
Start: 2022-05-12 — End: 2022-05-12
  Administered 2022-05-12: 2 mL via INTRAVENOUS

## 2022-05-13 ENCOUNTER — Encounter: Payer: Self-pay | Admitting: Internal Medicine

## 2022-05-14 ENCOUNTER — Other Ambulatory Visit (HOSPITAL_COMMUNITY): Payer: Self-pay | Admitting: Cardiovascular Disease

## 2022-05-14 ENCOUNTER — Ambulatory Visit (HOSPITAL_COMMUNITY): Payer: Medicare Other | Attending: Cardiovascular Disease

## 2022-05-14 VITALS — Ht 67.0 in | Wt 171.0 lb

## 2022-05-14 DIAGNOSIS — I25810 Atherosclerosis of coronary artery bypass graft(s) without angina pectoris: Secondary | ICD-10-CM | POA: Diagnosis not present

## 2022-05-14 DIAGNOSIS — R0609 Other forms of dyspnea: Secondary | ICD-10-CM | POA: Insufficient documentation

## 2022-05-14 DIAGNOSIS — I10 Essential (primary) hypertension: Secondary | ICD-10-CM | POA: Insufficient documentation

## 2022-05-14 DIAGNOSIS — Z951 Presence of aortocoronary bypass graft: Secondary | ICD-10-CM | POA: Insufficient documentation

## 2022-05-14 DIAGNOSIS — Z01810 Encounter for preprocedural cardiovascular examination: Secondary | ICD-10-CM | POA: Insufficient documentation

## 2022-05-14 DIAGNOSIS — I5032 Chronic diastolic (congestive) heart failure: Secondary | ICD-10-CM | POA: Insufficient documentation

## 2022-05-14 LAB — MYOCARDIAL PERFUSION IMAGING
LV dias vol: 87 mL (ref 62–150)
LV dias vol: 87 mL (ref 62–150)
LV sys vol: 39 mL
LV sys vol: 39 mL
Nuc Stress EF: 56 %
Nuc Stress EF: 56 %
Peak HR: 71 {beats}/min
Peak HR: 70 {beats}/min
Rest HR: 64 {beats}/min
Rest HR: 63 {beats}/min
Rest Nuclear Isotope Dose: 10.1 mCi
SDS: 0
SDS: 0
SRS: 0
SRS: 0
SSS: 0
SSS: 0
ST Depression (mm): 0 mm
Stress Nuclear Isotope Dose: 31.7 mCi
TID: 0.81
TID: 0.81

## 2022-05-14 MED ORDER — TECHNETIUM TC 99M TETROFOSMIN IV KIT
31.7000 | PACK | Freq: Once | INTRAVENOUS | Status: AC | PRN
  Administered 2022-05-14: 31.7 via INTRAVENOUS

## 2022-05-14 MED ORDER — TECHNETIUM TC 99M TETROFOSMIN IV KIT
10.1000 | PACK | Freq: Once | INTRAVENOUS | Status: AC | PRN
  Administered 2022-05-14: 10.1 via INTRAVENOUS

## 2022-05-14 MED ORDER — REGADENOSON 0.4 MG/5ML IV SOLN
0.4000 mg | Freq: Once | INTRAVENOUS | Status: AC
Start: 2022-05-14 — End: 2022-05-14
  Administered 2022-05-14: 0.4 mg via INTRAVENOUS

## 2022-05-19 ENCOUNTER — Other Ambulatory Visit: Payer: Self-pay | Admitting: Cardiovascular Disease

## 2022-05-19 ENCOUNTER — Other Ambulatory Visit: Payer: Self-pay | Admitting: Internal Medicine

## 2022-05-19 DIAGNOSIS — R0609 Other forms of dyspnea: Secondary | ICD-10-CM

## 2022-05-21 NOTE — Patient Instructions (Addendum)
SURGICAL WAITING ROOM VISITATION Patients having surgery or a procedure may have no more than 2 support people in the waiting area - these visitors may rotate in the visitor waiting room.   Due to an increase in RSV and influenza rates and associated hospitalizations, children ages 64 and under may not visit patients in [facility] hospitals. If the patient needs to stay at the hospital during part of their recovery, the visitor guidelines for inpatient rooms apply.  PRE-OP VISITATION  Pre-op nurse will coordinate an appropriate time for 1 support person to accompany the patient in pre-op.  This support person may not rotate.  This visitor will be contacted when the time is appropriate for the visitor to come back in the pre-op area.  Please refer to the [facility] website for the visitor guidelines for Inpatients (after your surgery is over and you are in a regular room).  You are not required to quarantine at this time prior to your surgery. However, you must do this: Hand Hygiene often Do NOT share personal items Notify your provider if you are in close contact with someone who has COVID or you develop fever 100.4 or greater, new onset of sneezing, cough, sore throat, shortness of breath or body aches.  If you test positive for Covid or have been in contact with anyone that has tested positive in the last 10 days please notify you surgeon.    Your procedure is scheduled on:  Wednesday  Jun 03, 2022  Report to [facility] Main Entrance: [facility] entrance where the [employer] is available.   Report to admitting at: 09:45   AM  +++++Call this number if you have any questions or problems the morning of surgery [phone]  DO NOT EAT OR DRINK ANYTHING AFTER MIDNIGHT THE NIGHT PRIOR TO YOUR SURGERY / PROCEDURE.   FOLLOW BOWEL PREP AND ANY ADDITIONAL PRE OP INSTRUCTIONS YOU RECEIVED FROM YOUR SURGEON'S OFFICE!!!  Magnesium Citrate- Obtain one 8 oz bottle and drink it before  noon the day before your surgery ( Tuesday 06-02-2022)   Oral Hygiene is also important to reduce your risk of infection.        Remember - BRUSH YOUR TEETH THE MORNING OF SURGERY WITH YOUR REGULAR TOOTHPASTE  Do NOT smoke after Midnight the night before surgery.  Aspirin 81 mg and PLAVIX: Stop both of these medicines 5 days prior to your surgery.   Take ONLY these medicines the morning of surgery with A SIP OF WATER: Isosorbide (Imdur), venlafaxine (Effexor), nebivolol (Bystolic), amlodipine, Pantoprazole (Protonix). You may take Percocet or Tylenol if needed for pain. You may use your Flonase nasal spray if needed.                      You may not have any metal on your body including  jewelry, and body piercing  Do not wear lotions, powders, cologne, or deodorant  Men may shave face and neck.  Contacts, Hearing Aids, dentures or bridgework may not be worn into surgery. DENTURES WILL BE REMOVED PRIOR TO SURGERY PLEASE DO NOT APPLY "Poly grip" OR ADHESIVES!!!  You may bring a small overnight bag with you on the day of surgery, only pack items that are not valuable. [facility] IS NOT RESPONSIBLE   FOR VALUABLES THAT ARE LOST OR STOLEN.   Do not bring your home medications to the hospital. The Pharmacy will dispense medications listed on your medication list to you during your admission  in the Hospital.  Special Instructions: Bring a copy of your healthcare power of attorney and living will documents the day of surgery, if you wish to have them scanned into your [facility]- EPIC  Please read over the following fact sheets you were given: IF YOU HAVE QUESTIONS ABOUT YOUR PRE-OP INSTRUCTIONS, PLEASE CALL [phone].   [place] - Preparing for Surgery Before surgery, you can play an important role.  Because skin is not sterile, your skin needs to be as free of germs as possible.  You can reduce the number of germs on your skin by washing with CHG (chlorahexidine  gluconate) soap before surgery.  CHG is an antiseptic cleaner which kills germs and bonds with the skin to continue killing germs even after washing. Please DO NOT use if you have an allergy to CHG or antibacterial soaps.  If your skin becomes reddened/irritated stop using the CHG and inform your nurse when you arrive at Short Stay. Do not shave (including legs and underarms) for at least 48 hours prior to the first CHG shower.  You may shave your face/neck.  Please follow these instructions carefully:  1.  Shower with CHG Soap the night before surgery and the  morning of surgery.  2.  If you choose to wash your hair, wash your hair first as usual with your normal  shampoo.  3.  After you shampoo, rinse your hair and body thoroughly to remove the shampoo.                             4.  Use CHG as you would any other liquid soap.  You can apply chg directly to the skin and wash.  Gently with a scrungie or clean washcloth.  5.  Apply the CHG Soap to your body ONLY FROM THE NECK DOWN.   Do not use on face/ open                           Wound or open sores. Avoid contact with eyes, ears mouth and genitals (private parts).                       Wash face,  Genitals (private parts) with your normal soap.             6.  Wash thoroughly, paying special attention to the area where your  surgery  will be performed.  7.  Thoroughly rinse your body with warm water from the neck down.  8.  DO NOT shower/wash with your normal soap after using and rinsing off the CHG Soap.            9.  Pat yourself dry with a clean towel.            10.  Wear clean pajamas.            11.  Place clean sheets on your bed the night of your first shower and do not  sleep with pets.  ON THE DAY OF SURGERY : Do not apply any lotions/deodorants the morning of surgery.  Please wear clean clothes to the hospital/surgery center.    FAILURE TO FOLLOW THESE INSTRUCTIONS MAY RESULT IN THE CANCELLATION OF YOUR SURGERY  PATIENT  SIGNATURE_________________________________  NURSE SIGNATURE__________________________________  ________________________________________________________________________

## 2022-05-21 NOTE — Progress Notes (Addendum)
COVID Vaccine received:  []  No [x]  Yes Date of any COVID positive Test in last 90 days:  none  PCP - Philip Aspen, MD Cardiologist - Nicki Guadalajara, MD cardiac clearance  in 05-05-22 note Oncology- Si Gaul, MD GI- Corliss Parish, MD  Chest x-ray - 06-03-2020 1v  Epic EKG -  04-28-2022  Epic Stress Test - 05-14-2022  Epic ECHO - 05-12-2022  Epic Cardiac Cath - 01-05-2019  Epic  PCR screen: []  Ordered & Completed           []   No Order but Needs PROFEND           [x]   N/A for this surgery  Surgery Plan:  []  Ambulatory                            []  Outpatient in bed                            [x]  Admit  Anesthesia:    [x]  General  []  Spinal                           []   Choice []   MAC  Bowel Prep - []  No  [x]   Yes Magnesium Citrate 1 bottle by noon on 06-02-22  Pacemaker / ICD device [x]  No []  Yes   Spinal Cord Stimulator:[x]  No []  Yes       History of Sleep Apnea? []  No [x]  Yes   CPAP used?- [x]  No []  Yes   can't tolerate  Does the patient monitor blood sugar?          [x]  No []  Yes  []  N/A  Patient has: []  NO Hx DM   []  Pre-DM                 []  DM1  [x]   DM2 Does patient have a [employer] or Dexacom? []  No []  Yes   Fasting Blood Sugar Ranges-  Checks Blood Sugar __0___ times a day  Blood Thinner / Instructions:  Plavix  Hold x 5 days OK'd per Dr. Tresa Endo 05-05-22 note Aspirin Instructions:  ASA 81 mg    hold x 5-7 days  patient is aware.  ERAS Protocol Ordered: [x]  No  []  Yes Patient is to be NPO after: midnight prior  Activity level: Can go up a flight of stairs and perform activities of daily living without stopping and without symptoms of chest pain.  Patient does have shortness of breath with exertion at times.  States that this has not worsened. .   Anesthesia review: Hx of MI, CAD - CABG x4 (2001),  CHF, COPD, HTN, OSA (no CPAP), GERD,   Patient denies shortness of breath, fever, cough and chest pain at PAT appointment.  Patient verbalized  understanding and agreement to the Pre-Surgical Instructions that were given to them at this PAT appointment. Patient was also educated of the need to review these PAT instructions again prior to his surgery.I reviewed the appropriate phone numbers to call if they have any and questions or concerns.

## 2022-05-22 ENCOUNTER — Encounter (HOSPITAL_COMMUNITY)
Admission: RE | Admit: 2022-05-22 | Discharge: 2022-05-22 | Disposition: A | Discharge status: Home / Self Care | Payer: Medicare Other | Source: Ambulatory Visit | Attending: Urology | Admitting: Urology

## 2022-05-22 ENCOUNTER — Encounter (HOSPITAL_COMMUNITY): Payer: Self-pay

## 2022-05-22 ENCOUNTER — Other Ambulatory Visit: Payer: Self-pay

## 2022-05-22 VITALS — BP 109/54 | HR 58 | Temp 98.7°F | Resp 18 | Ht 67.0 in | Wt 170.0 lb

## 2022-05-22 DIAGNOSIS — Z87891 Personal history of nicotine dependence: Secondary | ICD-10-CM | POA: Diagnosis not present

## 2022-05-22 DIAGNOSIS — C763 Malignant neoplasm of pelvis: Secondary | ICD-10-CM | POA: Diagnosis not present

## 2022-05-22 DIAGNOSIS — I2510 Atherosclerotic heart disease of native coronary artery without angina pectoris: Secondary | ICD-10-CM | POA: Diagnosis not present

## 2022-05-22 DIAGNOSIS — Z01812 Encounter for preprocedural laboratory examination: Secondary | ICD-10-CM | POA: Diagnosis not present

## 2022-05-22 DIAGNOSIS — N183 Chronic kidney disease, stage 3 unspecified: Secondary | ICD-10-CM | POA: Diagnosis not present

## 2022-05-22 DIAGNOSIS — J449 Chronic obstructive pulmonary disease, unspecified: Secondary | ICD-10-CM | POA: Diagnosis not present

## 2022-05-22 DIAGNOSIS — E1122 Type 2 diabetes mellitus with diabetic chronic kidney disease: Secondary | ICD-10-CM | POA: Diagnosis not present

## 2022-05-22 DIAGNOSIS — Z79891 Long term (current) use of opiate analgesic: Secondary | ICD-10-CM | POA: Insufficient documentation

## 2022-05-22 DIAGNOSIS — I129 Hypertensive chronic kidney disease with stage 1 through stage 4 chronic kidney disease, or unspecified chronic kidney disease: Secondary | ICD-10-CM | POA: Diagnosis not present

## 2022-05-22 DIAGNOSIS — G4730 Sleep apnea, unspecified: Secondary | ICD-10-CM | POA: Diagnosis not present

## 2022-05-22 DIAGNOSIS — Z951 Presence of aortocoronary bypass graft: Secondary | ICD-10-CM | POA: Insufficient documentation

## 2022-05-22 DIAGNOSIS — E119 Type 2 diabetes mellitus without complications: Secondary | ICD-10-CM

## 2022-05-22 HISTORY — DX: Chronic kidney disease, unspecified: N18.9

## 2022-05-22 LAB — COMPREHENSIVE METABOLIC PANEL
ALT: 11 U/L (ref 0–44)
AST: 18 U/L (ref 15–41)
Albumin: 3.8 g/dL (ref 3.5–5.0)
Alkaline Phosphatase: 56 U/L (ref 38–126)
Anion gap: 7 (ref 5–15)
BUN: 28 mg/dL — ABNORMAL HIGH (ref 8–23)
CO2: 27 mmol/L (ref 22–32)
Calcium: 8.8 mg/dL — ABNORMAL LOW (ref 8.9–10.3)
Chloride: 106 mmol/L (ref 98–111)
Creatinine, Ser: 1.87 mg/dL — ABNORMAL HIGH (ref 0.61–1.24)
GFR, Estimated: 36 mL/min — ABNORMAL LOW (ref >60)
Glucose, Bld: 125 mg/dL — ABNORMAL HIGH (ref 70–99)
Potassium: 3.9 mmol/L (ref 3.5–5.1)
Sodium: 140 mmol/L (ref 135–145)
Total Bilirubin: 0.6 mg/dL (ref 0.3–1.2)
Total Protein: 7.3 g/dL (ref 6.5–8.1)

## 2022-05-22 LAB — CBC
HCT: 39.7 % (ref 39.0–52.0)
Hemoglobin: 12.6 g/dL — ABNORMAL LOW (ref 13.0–17.0)
MCH: 28.3 pg (ref 26.0–34.0)
MCHC: 31.7 g/dL (ref 30.0–36.0)
MCV: 89.2 fL (ref 80.0–100.0)
Platelets: 167 10*3/uL (ref 150–400)
RBC: 4.45 MIL/uL (ref 4.22–5.81)
RDW: 13.7 % (ref 11.5–15.5)
WBC: 7.5 10*3/uL (ref 4.0–10.5)
nRBC: 0 % (ref 0.0–0.2)

## 2022-05-22 LAB — HEMOGLOBIN A1C
Hgb A1c MFr Bld: 6 % — ABNORMAL HIGH (ref 4.8–5.6)
Mean Plasma Glucose: 125.5 mg/dL

## 2022-05-22 LAB — GLUCOSE, CAPILLARY: Glucose-Capillary: 140 mg/dL — ABNORMAL HIGH (ref 70–99)

## 2022-05-26 ENCOUNTER — Other Ambulatory Visit (HOSPITAL_COMMUNITY): Payer: Medicare Other

## 2022-05-26 NOTE — Progress Notes (Signed)
Anesthesia Chart Review   Case: [id number] Date/Time: 06/03/22 1145   Procedure: XI ROBOT ASSITED LAPAROSCOPIC NEPHROURETERECTOMY (Right) - 3 HRS   Anesthesia type: General   Pre-op diagnosis: RIGHT RENAL PELVIS CANCER   [facility] / [facility]   Surgeons: Loletta Parish., MD       DISCUSSION: 82 year old former smoker with history of COPD, sleep apnea, HTN, DM, CAD, CKD Stage III, right renal pelvic cancer scheduled for above procedure 06/03/2022 with Dr. Sebastian Ache.  Patient last seen by cardiology 04/28/2022 for preoperative evaluation.  He has a follow up 05/27/2022, will follow.  VS: BP (!) 109/54 Comment: right arm sitting  Pulse (!) 58   Temp 37.1 C (Oral)   Resp 18   Ht 5\' 7"  (1.702 m)   Wt 77.1 kg   SpO2 96%   BMI 26.63 kg/m   PROVIDERS: Philip Aspen, Limmie Patricia, MD is PCP  Cardiologist - Nicki Guadalajara, MD  LABS: Labs reviewed: Acceptable for surgery. (all labs ordered are listed, but only abnormal results are displayed)  Labs Reviewed  HEMOGLOBIN A1C - Abnormal; Notable for the following components:      Result Value   Hgb A1c MFr Bld 6.0 (*)    All other components within normal limits  COMPREHENSIVE METABOLIC PANEL - Abnormal; Notable for the following components:   Glucose, Bld 125 (*)    BUN 28 (*)    Creatinine, Ser 1.87 (*)    Calcium 8.8 (*)    GFR, Estimated 36 (*)    All other components within normal limits  CBC - Abnormal; Notable for the following components:   Hemoglobin 12.6 (*)    All other components within normal limits  GLUCOSE, CAPILLARY - Abnormal; Notable for the following components:   Glucose-Capillary 140 (*)    All other components within normal limits     IMAGES:   EKG:   CV: Echo 05/11/2022 1. Left ventricular ejection fraction, by estimation, is 50 to 55%. The  left ventricle has low normal function. The left ventricle has no regional  wall motion abnormalities. Left ventricular diastolic parameters were   normal.   2. Right ventricular systolic function is normal. The right ventricular  size is normal.   3. Left atrial size was moderately dilated.   4. The mitral valve is abnormal. Mild mitral valve regurgitation. No  evidence of mitral stenosis.   5. The aortic valve is tricuspid. There is mild calcification of the  aortic valve. There is mild thickening of the aortic valve. Aortic valve  regurgitation is trivial. Aortic valve sclerosis is present, with no  evidence of aortic valve stenosis.   6. Aortic dilatation noted. There is mild dilatation of the ascending  aorta, measuring 39 mm.   7. The inferior vena cava is normal in size with greater than 50%  respiratory variability, suggesting right atrial pressure of 3 mmHg.  Myocardial perfusion 05/14/2022   Findings are consistent with infarction. The study is intermediate risk.   No ST deviation was noted.   Left ventricular function is normal. Nuclear stress EF: 56 %. The left ventricular ejection fraction is normal (55-65%). End diastolic cavity size is normal.   Prior study available for comparison from 01/04/2018.   Moderate size and intensity fixed perfusion defect in the inferior wall basal-mid c/f scar. However, appears worse at rest and there's no specific wall motion abnormalities, may have an artifact component. No inducible ischemia Past Medical History:  Diagnosis Date  Allergy    Anxiety    B12 deficiency anemia    Blood transfusion without reported diagnosis    CAD (coronary artery disease)    Cancer (HCC)    bladder, Right renal cell also   Chronic kidney disease    Colon polyps    COPD (chronic obstructive pulmonary disease) (HCC)    Depression    Diabetes mellitus (HCC)    Dyspnea    Esophagus, Barrett's    GERD (gastroesophageal reflux disease)    Headache    History of bladder cancer    Bladder cancer "8 times"   History of hiatal hernia    Hyperlipidemia    Hypertension    Localized osteoarthrosis,  lower leg    Myocardial infarction (HCC) 2021   Pneumonia    Restless leg syndrome    Sleep apnea    does not wear cpap   Stenosis of esophagus     Past Surgical History:  Procedure Laterality Date   BILIARY BRUSHING  04/01/2018   Procedure: BILIARY BRUSHING;  Surgeon: Lemar Lofty., MD;  Location: [facility] ENDOSCOPY;  Service: Gastroenterology;;   BILIARY BRUSHING  09/12/2018   Procedure: BILIARY BRUSHING;  Surgeon: Lemar Lofty., MD;  Location: [facility] ENDOSCOPY;  Service: Gastroenterology;;   BILIARY BRUSHING  11/28/2018   Procedure: BILIARY BRUSHING;  Surgeon: Lemar Lofty., MD;  Location: [facility] ENDOSCOPY;  Service: Gastroenterology;;   BILIARY BRUSHING  03/11/2020   Procedure: BILIARY BRUSHING;  Surgeon: Lemar Lofty., MD;  Location: [facility] ENDOSCOPY;  Service: Gastroenterology;;   BILIARY DILATION  09/12/2018   Procedure: BILIARY DILATION;  Surgeon: Lemar Lofty., MD;  Location: [facility] ENDOSCOPY;  Service: Gastroenterology;;   BILIARY DILATION  11/28/2018   Procedure: BILIARY DILATION;  Surgeon: Lemar Lofty., MD;  Location: [facility] ENDOSCOPY;  Service: Gastroenterology;;   BILIARY DILATION  03/11/2020   Procedure: BILIARY DILATION;  Surgeon: Lemar Lofty., MD;  Location: [facility] ENDOSCOPY;  Service: Gastroenterology;;   BILIARY STENT PLACEMENT  04/01/2018   Procedure: BILIARY STENT PLACEMENT;  Surgeon: Lemar Lofty., MD;  Location: [facility] ENDOSCOPY;  Service: Gastroenterology;;   BILIARY STENT PLACEMENT  09/12/2018   Procedure: BILIARY STENT PLACEMENT;  Surgeon: Lemar Lofty., MD;  Location: [facility] ENDOSCOPY;  Service: Gastroenterology;;   BILIARY STENT PLACEMENT  11/28/2018   Procedure: BILIARY STENT PLACEMENT;  Surgeon: Lemar Lofty., MD;  Location: [facility] ENDOSCOPY;  Service: Gastroenterology;;   BIOPSY  04/01/2018   Procedure: BIOPSY;  Surgeon: Lemar Lofty., MD;  Location: [facility] ENDOSCOPY;  Service:  Gastroenterology;;   BIOPSY  09/12/2018   Procedure: BIOPSY;  Surgeon: Lemar Lofty., MD;  Location: [facility] ENDOSCOPY;  Service: Gastroenterology;;   BIOPSY  11/28/2018   Procedure: BIOPSY;  Surgeon: Lemar Lofty., MD;  Location: [facility] ENDOSCOPY;  Service: Gastroenterology;;   BIOPSY  03/11/2020   Procedure: BIOPSY;  Surgeon: Lemar Lofty., MD;  [facility];  Service: Gastroenterology;;   bladder cancer      x 8 cystoscopy   CERVICAL DISCECTOMY     ACDF   CHOLECYSTECTOMY     COLONOSCOPY  11/17/2005   normal    CORONARY ARTERY BYPASS GRAFT     x4   CORONARY STENT INTERVENTION N/A 01/05/2019   Procedure: CORONARY STENT INTERVENTION;  Surgeon: Yvonne Kendall, MD;  [facility]: [facility] INVASIVE CV LAB;  Service: Cardiovascular;  Laterality: N/A;   CYSTOSCOPY WITH BIOPSY N/A 03/27/2022   Procedure: CYSTOSCOPY WITH BIOPSY;  Surgeon: Bjorn Pippin, MD;  Location: [facility]  ORS;  Service: Urology;  Laterality: N/A;   CYSTOSCOPY WITH RETROGRADE PYELOGRAM, URETEROSCOPY AND STENT PLACEMENT Right 03/27/2022   Procedure: CYSTOSCOPY WITH RIGHT RETROGRADE PYELOGRAM, URETEROSCOPY AND STENT PLACEMENT urethral dilation;  Surgeon: Bjorn Pippin, MD;  [facility];  Service: Urology;  Laterality: Right;   ENDOSCOPIC MUCOSAL RESECTION  09/12/2018   Procedure: ENDOSCOPIC MUCOSAL RESECTION;  Surgeon: Meridee Score Netty Starring., MD;  Location: [facility] ENDOSCOPY;  Service: Gastroenterology;;   ENDOSCOPIC RETROGRADE CHOLANGIOPANCREATOGRAPHY (ERCP) WITH PROPOFOL N/A 04/01/2018   Procedure: ENDOSCOPIC RETROGRADE CHOLANGIOPANCREATOGRAPHY (ERCP) WITH PROPOFOL;  Surgeon: Lemar Lofty., MD;  Location: [facility] ENDOSCOPY;  Service: Gastroenterology;  Laterality: N/A;   ENDOSCOPIC RETROGRADE CHOLANGIOPANCREATOGRAPHY (ERCP) WITH PROPOFOL N/A 09/12/2018   Procedure: ENDOSCOPIC RETROGRADE CHOLANGIOPANCREATOGRAPHY (ERCP) WITH PROPOFOL;  Surgeon: Meridee Score Netty Starring., MD;  Location: [facility] ENDOSCOPY;  Service:  Gastroenterology;  Laterality: N/A;   ENDOSCOPIC RETROGRADE CHOLANGIOPANCREATOGRAPHY (ERCP) WITH PROPOFOL N/A 03/11/2020   Procedure: ENDOSCOPIC RETROGRADE CHOLANGIOPANCREATOGRAPHY (ERCP) WITH PROPOFOL;  Surgeon: Meridee Score Netty Starring., MD;  [facility]OS[facility];  Service: Gastroenterology;  Laterality: N/A;   ENDOSCOPIC RETROGRADE CHOLANGIOPANCREATOGRAPHY (ERCP) WITH PROPOFOL N/A 06/03/2020   Procedure: ENDOSCOPIC RETROGRADE CHOLANGIOPANCREATOGRAPHY (ERCP) WITH PROPOFOL;  Surgeon: Meridee Score Netty Starring., MD;  [facility];  Service: Gastroenterology;  Laterality: N/A;   ERCP N/A 11/28/2018   Procedure: ENDOSCOPIC RETROGRADE CHOLANGIOPANCREATOGRAPHY (ERCP) +EGD with spyglass;  Surgeon: Meridee Score Netty Starring., MD;  Location: [facility] ENDOSCOPY;  Service: Gastroenterology;  Laterality: N/A;   ESOPHAGOGASTRODUODENOSCOPY  04/29/2010   ESOPHAGOGASTRODUODENOSCOPY (EGD) WITH PROPOFOL N/A 04/01/2018   Procedure: ESOPHAGOGASTRODUODENOSCOPY (EGD) WITH PROPOFOL;  Surgeon: Meridee Score Netty Starring., MD;  Location: [facility] ENDOSCOPY;  Service: Gastroenterology;  Laterality: N/A;   ESOPHAGOGASTRODUODENOSCOPY (EGD) WITH PROPOFOL N/A 09/12/2018   Procedure: ESOPHAGOGASTRODUODENOSCOPY (EGD) WITH PROPOFOL;  Surgeon: Meridee Score Netty Starring., MD;  Location: [facility] ENDOSCOPY;  Service: Gastroenterology;  Laterality: N/A;   ESOPHAGOGASTRODUODENOSCOPY (EGD) WITH PROPOFOL N/A 11/28/2018   Procedure: ESOPHAGOGASTRODUODENOSCOPY (EGD) WITH PROPOFOL;  Surgeon: Meridee Score Netty Starring., MD;  Location: [facility] ENDOSCOPY;  Service: Gastroenterology;  Laterality: N/A;   ESOPHAGOGASTRODUODENOSCOPY (EGD) WITH PROPOFOL N/A 03/11/2020   Procedure: ESOPHAGOGASTRODUODENOSCOPY (EGD) WITH PROPOFOL;  Surgeon: Meridee Score Netty Starring., MD;  [facility]OS[facility];  Service: Gastroenterology;  Laterality: N/A;   ESOPHAGOGASTRODUODENOSCOPY (EGD) WITH PROPOFOL N/A 06/03/2020   Procedure: ESOPHAGOGASTRODUODENOSCOPY (EGD) WITH PROPOFOL;  Surgeon:  Meridee Score Netty Starring., MD;  [facility] ENDOS[facility];  Service: Gastroenterology;  Laterality: N/A;   EUS  04/01/2018   Procedure: FULL UPPER ENDOSCOPIC ULTRASOUND (EUS) RADIAL;  Surgeon: Lemar Lofty., MD;  Location: [facility] ENDOSCOPY;  Service: Gastroenterology;;   EUS N/A 09/12/2018   Procedure: UPPER ENDOSCOPIC ULTRASOUND (EUS) RADIAL;  Surgeon: Lemar Lofty., MD;  Location: [facility] ENDOSCOPY;  Service: Gastroenterology;  Laterality: N/A;   FINE NEEDLE ASPIRATION  09/12/2018   Procedure: FINE NEEDLE ASPIRATION (FNA) LINEAR;  Surgeon: Lemar Lofty., MD;  Location: [facility] ENDOSCOPY;  Service: Gastroenterology;;   HAND SURGERY Left 2018   saw accident   HEMOSTASIS CLIP PLACEMENT  09/12/2018   Procedure: HEMOSTASIS CLIP PLACEMENT;  Surgeon: Lemar Lofty., MD;  Location: [facility] ENDOSCOPY;  Service: Gastroenterology;;   HEMOSTASIS CLIP PLACEMENT  06/03/2020   Procedure: HEMOSTASIS CLIP PLACEMENT;  Surgeon: Lemar Lofty., MD;  Location: [facility] ENDOSCOPY;  Service: Gastroenterology;;   I & D EXTREMITY Left 11/24/2016   Procedure: IRRIGATION AND DEBRIDEMENT LEFT HAND, THUMB, INDEX, MIDDLE, RING, AND SMALL FINGERS WITH RECONSTRUCTION;  Surgeon: Dominica Severin, MD;  Location[facility];  Service: Orthopedics;  Laterality: Left;   KNEE ARTHROSCOPY Left  LEFT HEART CATH AND CORS/GRAFTS ANGIOGRAPHY N/A 01/05/2019   Procedure: LEFT HEART CATH AND CORS/GRAFTS ANGIOGRAPHY;  Surgeon: Yvonne Kendall, MD;  [facility] LAB;  Service: Cardiovascular;  Laterality: N/A;   LUMBAR LAMINECTOMY     and fusion x 2   NASAL SINUS SURGERY     POPLITEAL SYNOVIAL CYST EXCISION     REMOVAL OF STONES  04/01/2018   Procedure: REMOVAL OF STONES;  Surgeon: Meridee Score Netty Starring., MD;  Location: [facility] ENDOSCOPY;  Service: Gastroenterology;;   REMOVAL OF STONES  09/12/2018   Procedure: REMOVAL OF STONES;  Surgeon: Lemar Lofty., MD;  Location: [facility] ENDOSCOPY;  Service:  Gastroenterology;;   REMOVAL OF STONES  11/28/2018   Procedure: REMOVAL OF STONES;  Surgeon: Lemar Lofty., MD;  Location: [facility] ENDOSCOPY;  Service: Gastroenterology;;   REMOVAL OF STONES  03/11/2020   Procedure: REMOVAL OF STONES;  Surgeon: Lemar Lofty., MD;  Location: [facility] ENDOSCOPY;  Service: Gastroenterology;;   REMOVAL OF STONES  06/03/2020   Procedure: REMOVAL OF STONES;  Surgeon: Lemar Lofty., MD;  Location: [facility] ENDOSCOPY;  Service: Gastroenterology;;   Gaspar Bidding DILATION N/A 09/12/2018   Procedure: Jacklyn Shell;  Surgeon: Lemar Lofty., MD;  Location: [facility] ENDOSCOPY;  Service: Gastroenterology;  Laterality: N/A;   SAVORY DILATION N/A 11/28/2018   Procedure: SAVORY DILATION;  Surgeon: Meridee Score Netty Starring., MD;  Location: [facility] ENDOSCOPY;  Service: Gastroenterology;  Laterality: N/A;   SEPTOPLASTY Bilateral 05/26/2021   Procedure: SEPTOPLASTY;  Surgeon: Newman Pies, MD;  Location: [facility];  Service: ENT;  Laterality: Bilateral;   SPHINCTEROTOMY  04/01/2018   Procedure: Dennison Mascot;  Surgeon: Mansouraty, Netty Starring., MD;  Location: [facility] ENDOSCOPY;  Service: Gastroenterology;;   Burman Freestone CHOLANGIOSCOPY N/A 11/28/2018   Procedure: [id number] CHOLANGIOSCOPY;  Surgeon: Lemar Lofty., MD;  Location: [facility] ENDOSCOPY;  Service: Gastroenterology;  Laterality: N/A;   SPYGLASS CHOLANGIOSCOPY N/A 06/03/2020   Procedure: SPYGLASS CHOLANGIOSCOPY;  Surgeon: Lemar Lofty., MD;  Location[facility] ENDOS[facility];  Service: Gastroenterology;  Laterality: N/A;   STENT REMOVAL  09/12/2018   Procedure: STENT REMOVAL;  Surgeon: Lemar Lofty., MD;  Location: [facility] ENDOSCOPY;  Service: Gastroenterology;;   Francine Graven REMOVAL  11/28/2018   Procedure: STENT REMOVAL;  Surgeon: Lemar Lofty., MD;  Location: [facility] ENDOSCOPY;  Service: Gastroenterology;;   Francine Graven REMOVAL  03/11/2020   Procedure: STENT REMOVAL;  Surgeon: Lemar Lofty., MD;   Location[facility] ENDOSCOPY;  Service: Gastroenterology;;   Sunnie Nielsen LIFTING INJECTION  09/12/2018   Procedure: SUBMUCOSAL LIFTING INJECTION;  Surgeon: Lemar Lofty., MD;  Location: [facility] ENDOSCOPY;  Service: Gastroenterology;;    MEDICATIONS:  acetaminophen (TYLENOL) 500 MG tablet   amLODipine (NORVASC) 2.5 MG tablet   aspirin EC 81 MG tablet   B-D 3CC LUER-LOK SYR 23GX1" 23G X 1" 3 ML MISC   cetirizine (ZYRTEC) 10 MG tablet   clopidogrel (PLAVIX) 75 MG tablet   Coenzyme Q10 300 MG CAPS   cyanocobalamin (VITAMIN B12) 1000 MCG/ML injection   ezetimibe (ZETIA) 10 MG tablet   Ferrous Sulfate (IRON PO)   fluticasone (FLONASE) 50 MCG/ACT nasal spray   isosorbide mononitrate (IMDUR) 60 MG 24 hr tablet   Menthol, Topical Analgesic, (BENGAY EX)   METAMUCIL FIBER PO   Multiple Vitamins-Minerals (MULTIVITAMIN WITH MINERALS) tablet   Naphazoline-Pheniramine (OPCON-A) 0.027-0.315 % SOLN   nebivolol (BYSTOLIC) 10 MG tablet   NEEDLE, DISP, 25 G (B-D DISP NEEDLE 25GX1") 25G X 1" MISC   nitroGLYCERIN (NITROSTAT) 0.4 MG SL tablet  oxyCODONE-acetaminophen (PERCOCET) 10-325 MG tablet   pantoprazole (PROTONIX) 40 MG tablet   Probiotic Product (PROBIOTIC GUMMIES PO)   rOPINIRole (REQUIP) 2 MG tablet   rosuvastatin (CRESTOR) 10 MG tablet   sucralfate (CARAFATE) 1 GM/10ML suspension   venlafaxine (EFFEXOR) 75 MG tablet   No current facility-administered medications for this encounter.     Jodell Cipro Ward, PA-C [facility] [phone]

## 2022-05-27 ENCOUNTER — Ambulatory Visit: Payer: Medicare Other | Attending: Nurse Practitioner | Admitting: Nurse Practitioner

## 2022-05-27 ENCOUNTER — Encounter: Payer: Self-pay | Admitting: Nurse Practitioner

## 2022-05-27 VITALS — BP 112/62 | HR 59 | Ht 67.0 in | Wt 168.2 lb

## 2022-05-27 DIAGNOSIS — Z01810 Encounter for preprocedural cardiovascular examination: Secondary | ICD-10-CM | POA: Diagnosis not present

## 2022-05-27 DIAGNOSIS — I10 Essential (primary) hypertension: Secondary | ICD-10-CM

## 2022-05-27 DIAGNOSIS — I2510 Atherosclerotic heart disease of native coronary artery without angina pectoris: Secondary | ICD-10-CM

## 2022-05-27 DIAGNOSIS — E785 Hyperlipidemia, unspecified: Secondary | ICD-10-CM

## 2022-05-27 DIAGNOSIS — G4733 Obstructive sleep apnea (adult) (pediatric): Secondary | ICD-10-CM

## 2022-05-27 DIAGNOSIS — Z7985 Long-term (current) use of injectable non-insulin antidiabetic drugs: Secondary | ICD-10-CM

## 2022-05-27 DIAGNOSIS — E119 Type 2 diabetes mellitus without complications: Secondary | ICD-10-CM | POA: Diagnosis not present

## 2022-05-27 NOTE — Patient Instructions (Signed)
Medication Instructions:  Your physician recommends that you continue on your current medications as directed. Please refer to the Current Medication list given to you today.   *If you need a refill on your cardiac medications before your next appointment, please call your pharmacy*   Lab Work: NONE ordered at this time of appointment   If you have labs (blood work) drawn today and your tests are completely normal, you will receive your results only by: MyChart Message (if you have MyChart) OR A paper copy in the mail If you have any lab test that is abnormal or we need to change your treatment, we will call you to review the results.   Testing/Procedures: NONE ordered at this time of appointment     Follow-Up: At [facility], you and your health needs are our priority.  As part of our continuing mission to provide you with exceptional heart care, we have created designated Provider Care Teams.  These Care Teams include your primary Cardiologist (physician) and Advanced Practice Providers (APPs -  Physician Assistants and Nurse Practitioners) who all work together to provide you with the care you need, when you need it.  We recommend signing up for the patient portal called "MyChart".  Sign up information is provided on this After Visit Summary.  MyChart is used to connect with patients for Virtual Visits (Telemedicine).  Patients are able to view lab/test results, encounter notes, upcoming appointments, etc.  Non-urgent messages can be sent to your provider as well.   To learn more about what you can do with MyChart, go to [email or web address].    Your next appointment:   6 month(s)  Provider:   Thomas Kelly, MD     Other Instructions   

## 2022-05-27 NOTE — Anesthesia Preprocedure Evaluation (Addendum)
Anesthesia Evaluation  Patient identified by MRN, date of birth, ID band Patient awake    Reviewed: Allergy & Precautions, NPO status , Patient's Chart, lab work & pertinent test results  Airway Mallampati: II  TM Distance: >3 FB Neck ROM: Full    Dental no notable dental hx. (+) Edentulous Upper, Edentulous Lower   Pulmonary sleep apnea , COPD, former smoker   Pulmonary exam normal breath sounds clear to auscultation       Cardiovascular hypertension, Pt. on medications and Pt. on home beta blockers + CAD, + Past MI (2022), +CHF and + DOE  Normal cardiovascular exam Rhythm:Regular Rate:Normal  ECHO 2021:  1. Left ventricular ejection fraction, by estimation, is 50 to 55%. The  left ventricle has low normal function. The left ventricle has no regional  wall motion abnormalities. Left ventricular diastolic parameters are  consistent with Grade II diastolic  dysfunction (pseudonormalization).   2. Right ventricular systolic function is normal. The right ventricular  size is normal. There is moderately elevated pulmonary artery systolic  pressure.   3. Left atrial size was moderately dilated.   4. The mitral valve is normal in structure. Trivial mitral valve  regurgitation. No evidence of mitral stenosis.   5. The aortic valve is grossly normal. Aortic valve regurgitation is  trivial. No aortic stenosis is present.     Neuro/Psych  Headaches  Anxiety Depression       GI/Hepatic Neg liver ROS, hiatal hernia,GERD  Medicated,,  Endo/Other  diabetes    Renal/GU Renal InsufficiencyRenal diseaseR renal pelvis CA Lab Results      Component                Value               Date                      CREATININE               1.87 (H)            05/22/2022                BUN                      28 (H)              05/22/2022                NA                       140                 05/22/2022                K                         3.9                 05/22/2022                CL                       106                 05/22/2022                CO2  27                  05/22/2022          \   negative genitourinary   Musculoskeletal  (+) Arthritis , Osteoarthritis,    Abdominal   Peds  Hematology  (+) Blood dyscrasia, anemia Lab Results      Component                Value               Date                      WBC                      7.5                 05/22/2022                HGB                      12.6 (L)            05/22/2022                HCT                      39.7                05/22/2022                MCV                      89.2                05/22/2022                PLT                      167                 05/22/2022              Anesthesia Other Findings   Reproductive/Obstetrics                             Anesthesia Physical Anesthesia Plan  ASA: 3  Anesthesia Plan: General   Post-op Pain Management: Lidocaine infusion*, Ofirmev IV (intra-op)* and Ketamine IV*   Induction: Intravenous  PONV Risk Score and Plan: 2 and Ondansetron, Dexamethasone and Treatment may vary due to age or medical condition  Airway Management Planned: Oral ETT  Additional Equipment: None  Intra-op Plan:   Post-operative Plan: Extubation in OR  Informed Consent: I have reviewed the patients History and Physical, chart, labs and discussed the procedure including the risks, benefits and alternatives for the proposed anesthesia with the patient or authorized representative who has indicated his/her understanding and acceptance.     Dental advisory given  Plan Discussed with: CRNA and Anesthesiologist  Anesthesia Plan Comments: (See PAT note 05/22/2022)       Anesthesia Quick Evaluation

## 2022-05-27 NOTE — Progress Notes (Signed)
Office Visit    Patient Name: [name] Date of Encounter: 05/27/2022  Primary Care Provider:  Philip Aspen, Limmie Patricia, MD Primary Cardiologist:  Nicki Guadalajara, MD  Chief Complaint    82 year old male with a history of CAD s/p CABG x 4 in 2001, DESx3 overlapping- SVG-RPDA/RPL in 2020, hypertension, hyperlipidemia, OSA not on CPAP, type 2 diabetes, bladder cancer, R renal cell carcinoma, COPD, and GERD who presents for follow-up related to CAD and for preoperative cardiac evaluation.  Past Medical History    Past Medical History:  Diagnosis Date   Allergy    Anxiety    B12 deficiency anemia    Blood transfusion without reported diagnosis    CAD (coronary artery disease)    Cancer (HCC)    bladder, Right renal cell also   Chronic kidney disease    Colon polyps    COPD (chronic obstructive pulmonary disease) (HCC)    Depression    Diabetes mellitus (HCC)    Dyspnea    Esophagus, Barrett's    GERD (gastroesophageal reflux disease)    Headache    History of bladder cancer    Bladder cancer "8 times"   History of hiatal hernia    Hyperlipidemia    Hypertension    Localized osteoarthrosis, lower leg    Myocardial infarction (HCC) 2021   Pneumonia    Restless leg syndrome    Sleep apnea    does not wear cpap   Stenosis of esophagus    Past Surgical History:  Procedure Laterality Date   BILIARY BRUSHING  04/01/2018   Procedure: BILIARY BRUSHING;  Surgeon: Lemar Lofty., MD;  Location: [facility] ENDOSCOPY;  Service: Gastroenterology;;   BILIARY BRUSHING  09/12/2018   Procedure: BILIARY BRUSHING;  Surgeon: Lemar Lofty., MD;  Location: [facility] ENDOSCOPY;  Service: Gastroenterology;;   BILIARY BRUSHING  11/28/2018   Procedure: BILIARY BRUSHING;  Surgeon: Lemar Lofty., MD;  Location: [facility] ENDOSCOPY;  Service: Gastroenterology;;   BILIARY BRUSHING  03/11/2020   Procedure: BILIARY BRUSHING;  Surgeon: Lemar Lofty., MD;  Location: [facility]  ENDOSCOPY;  Service: Gastroenterology;;   BILIARY DILATION  09/12/2018   Procedure: BILIARY DILATION;  Surgeon: Lemar Lofty., MD;  Location: [facility] ENDOSCOPY;  Service: Gastroenterology;;   BILIARY DILATION  11/28/2018   Procedure: BILIARY DILATION;  Surgeon: Lemar Lofty., MD;  Location: [facility] ENDOSCOPY;  Service: Gastroenterology;;   BILIARY DILATION  03/11/2020   Procedure: BILIARY DILATION;  Surgeon: Lemar Lofty., MD;  Location: [facility] ENDOSCOPY;  Service: Gastroenterology;;   BILIARY STENT PLACEMENT  04/01/2018   Procedure: BILIARY STENT PLACEMENT;  Surgeon: Lemar Lofty., MD;  Location: [facility] ENDOSCOPY;  Service: Gastroenterology;;   BILIARY STENT PLACEMENT  09/12/2018   Procedure: BILIARY STENT PLACEMENT;  Surgeon: Lemar Lofty., MD;  Location: [facility] ENDOSCOPY;  Service: Gastroenterology;;   BILIARY STENT PLACEMENT  11/28/2018   Procedure: BILIARY STENT PLACEMENT;  Surgeon: Lemar Lofty., MD;  Location: [facility] ENDOSCOPY;  Service: Gastroenterology;;   BIOPSY  04/01/2018   Procedure: BIOPSY;  Surgeon: Lemar Lofty., MD;  Location: [facility] ENDOSCOPY;  Service: Gastroenterology;;   BIOPSY  09/12/2018   Procedure: BIOPSY;  Surgeon: Lemar Lofty., MD;  Location: [facility] ENDOSCOPY;  Service: Gastroenterology;;   BIOPSY  11/28/2018   Procedure: BIOPSY;  Surgeon: Lemar Lofty., MD;  Location: [facility] ENDOSCOPY;  Service: Gastroenterology;;   BIOPSY  03/11/2020   Procedure: BIOPSY;  Surgeon: Lemar Lofty., MD;  Location: [facility] ENDOSCOPY;  Service:  Gastroenterology;;   bladder cancer      x 8 cystoscopy   CERVICAL DISCECTOMY     ACDF   CHOLECYSTECTOMY     COLONOSCOPY  11/17/2005   normal    CORONARY ARTERY BYPASS GRAFT     x4   CORONARY STENT INTERVENTION N/A 01/05/2019   Procedure: CORONARY STENT INTERVENTION;  Surgeon: Yvonne Kendall, MD;  Location: [facility] INVASIVE CV LAB;  Service: Cardiovascular;  Laterality: N/A;    CYSTOSCOPY WITH BIOPSY N/A 03/27/2022   Procedure: CYSTOSCOPY WITH BIOPSY;  Surgeon: Bjorn Pippin, MD;  Location: [facility] ORS;  Service: Urology;  Laterality: N/A;   CYSTOSCOPY WITH RETROGRADE PYELOGRAM, URETEROSCOPY AND STENT PLACEMENT Right 03/27/2022   Procedure: CYSTOSCOPY WITH RIGHT RETROGRADE PYELOGRAM, URETEROSCOPY AND STENT PLACEMENT urethral dilation;  Surgeon: Bjorn Pippin, MD;  Location: [facility] ORS;  Service: Urology;  Laterality: Right;   ENDOSCOPIC MUCOSAL RESECTION  09/12/2018   Procedure: ENDOSCOPIC MUCOSAL RESECTION;  Surgeon: Meridee Score Netty Starring., MD;  Location: [facility] ENDOSCOPY;  Service: Gastroenterology;;   ENDOSCOPIC RETROGRADE CHOLANGIOPANCREATOGRAPHY (ERCP) WITH PROPOFOL N/A 04/01/2018   Procedure: ENDOSCOPIC RETROGRADE CHOLANGIOPANCREATOGRAPHY (ERCP) WITH PROPOFOL;  Surgeon: Lemar Lofty., MD;  Location: [facility] ENDOSCOPY;  Service: Gastroenterology;  Laterality: N/A;   ENDOSCOPIC RETROGRADE CHOLANGIOPANCREATOGRAPHY (ERCP) WITH PROPOFOL N/A 09/12/2018   Procedure: ENDOSCOPIC RETROGRADE CHOLANGIOPANCREATOGRAPHY (ERCP) WITH PROPOFOL;  Surgeon: Meridee Score Netty Starring., MD;  Location: [facility] ENDOSCOPY;  Service: Gastroenterology;  Laterality: N/A;   ENDOSCOPIC RETROGRADE CHOLANGIOPANCREATOGRAPHY (ERCP) WITH PROPOFOL N/A 03/11/2020   Procedure: ENDOSCOPIC RETROGRADE CHOLANGIOPANCREATOGRAPHY (ERCP) WITH PROPOFOL;  Surgeon: Meridee Score Netty Starring., MD;  Location: [facility] ENDOSCOPY;  Service: Gastroenterology;  Laterality: N/A;   ENDOSCOPIC RETROGRADE CHOLANGIOPANCREATOGRAPHY (ERCP) WITH PROPOFOL N/A 06/03/2020   Procedure: ENDOSCOPIC RETROGRADE CHOLANGIOPANCREATOGRAPHY (ERCP) WITH PROPOFOL;  Surgeon: Meridee Score Netty Starring., MD;  Location: [facility] ENDOSCOPY;  Service: Gastroenterology;  Laterality: N/A;   ERCP N/A 11/28/2018   Procedure: ENDOSCOPIC RETROGRADE CHOLANGIOPANCREATOGRAPHY (ERCP) +EGD with spyglass;  Surgeon: Meridee Score Netty Starring., MD;  Location: [facility] ENDOSCOPY;  Service: Gastroenterology;   Laterality: N/A;   ESOPHAGOGASTRODUODENOSCOPY  04/29/2010   ESOPHAGOGASTRODUODENOSCOPY (EGD) WITH PROPOFOL N/A 04/01/2018   Procedure: ESOPHAGOGASTRODUODENOSCOPY (EGD) WITH PROPOFOL;  Surgeon: Meridee Score Netty Starring., MD;  Location: [facility] ENDOSCOPY;  Service: Gastroenterology;  Laterality: N/A;   ESOPHAGOGASTRODUODENOSCOPY (EGD) WITH PROPOFOL N/A 09/12/2018   Procedure: ESOPHAGOGASTRODUODENOSCOPY (EGD) WITH PROPOFOL;  Surgeon: Meridee Score Netty Starring., MD;  Location: [facility] ENDOSCOPY;  Service: Gastroenterology;  Laterality: N/A;   ESOPHAGOGASTRODUODENOSCOPY (EGD) WITH PROPOFOL N/A 11/28/2018   Procedure: ESOPHAGOGASTRODUODENOSCOPY (EGD) WITH PROPOFOL;  Surgeon: Meridee Score Netty Starring., MD;  Location: [facility] ENDOSCOPY;  Service: Gastroenterology;  Laterality: N/A;   ESOPHAGOGASTRODUODENOSCOPY (EGD) WITH PROPOFOL N/A 03/11/2020   Procedure: ESOPHAGOGASTRODUODENOSCOPY (EGD) WITH PROPOFOL;  Surgeon: Meridee Score Netty Starring., MD;  Location: [facility] ENDOSCOPY;  Service: Gastroenterology;  Laterality: N/A;   ESOPHAGOGASTRODUODENOSCOPY (EGD) WITH PROPOFOL N/A 06/03/2020   Procedure: ESOPHAGOGASTRODUODENOSCOPY (EGD) WITH PROPOFOL;  Surgeon: Meridee Score Netty Starring., MD;  Location: [facility] ENDOSCOPY;  Service: Gastroenterology;  Laterality: N/A;   EUS  04/01/2018   Procedure: FULL UPPER ENDOSCOPIC ULTRASOUND (EUS) RADIAL;  Surgeon: Lemar Lofty., MD;  Location: [facility] ENDOSCOPY;  Service: Gastroenterology;;   EUS N/A 09/12/2018   Procedure: UPPER ENDOSCOPIC ULTRASOUND (EUS) RADIAL;  Surgeon: Lemar Lofty., MD;  Location: [facility] ENDOSCOPY;  Service: Gastroenterology;  Laterality: N/A;   FINE NEEDLE ASPIRATION  09/12/2018   Procedure: FINE NEEDLE ASPIRATION (FNA) LINEAR;  Surgeon: Lemar Lofty., MD;  Location: [facility] ENDOSCOPY;  Service: Gastroenterology;;   HAND SURGERY Left 2018   saw accident  HEMOSTASIS CLIP PLACEMENT  09/12/2018   Procedure: HEMOSTASIS CLIP PLACEMENT;  Surgeon: Lemar Lofty., MD;   Location: [facility] ENDOSCOPY;  Service: Gastroenterology;;   HEMOSTASIS CLIP PLACEMENT  06/03/2020   Procedure: HEMOSTASIS CLIP PLACEMENT;  Surgeon: Lemar Lofty., MD;  Location: [facility] ENDOSCOPY;  Service: Gastroenterology;;   I & D EXTREMITY Left 11/24/2016   Procedure: IRRIGATION AND DEBRIDEMENT LEFT HAND, THUMB, INDEX, MIDDLE, RING, AND SMALL FINGERS WITH RECONSTRUCTION;  Surgeon: Dominica Severin, MD;  Location: [facility] OR;  Service: Orthopedics;  Laterality: Left;   KNEE ARTHROSCOPY Left    LEFT HEART CATH AND CORS/GRAFTS ANGIOGRAPHY N/A 01/05/2019   Procedure: LEFT HEART CATH AND CORS/GRAFTS ANGIOGRAPHY;  Surgeon: Yvonne Kendall, MD;  Location: [facility] INVASIVE CV LAB;  Service: Cardiovascular;  Laterality: N/A;   LUMBAR LAMINECTOMY     and fusion x 2   NASAL SINUS SURGERY     POPLITEAL SYNOVIAL CYST EXCISION     REMOVAL OF STONES  04/01/2018   Procedure: REMOVAL OF STONES;  Surgeon: Meridee Score Netty Starring., MD;  Location: [facility] ENDOSCOPY;  Service: Gastroenterology;;   REMOVAL OF STONES  09/12/2018   Procedure: REMOVAL OF STONES;  Surgeon: Lemar Lofty., MD;  Location: [facility] ENDOSCOPY;  Service: Gastroenterology;;   REMOVAL OF STONES  11/28/2018   Procedure: REMOVAL OF STONES;  Surgeon: Lemar Lofty., MD;  Location: [facility] ENDOSCOPY;  Service: Gastroenterology;;   REMOVAL OF STONES  03/11/2020   Procedure: REMOVAL OF STONES;  Surgeon: Lemar Lofty., MD;  Location: [facility] ENDOSCOPY;  Service: Gastroenterology;;   REMOVAL OF STONES  06/03/2020   Procedure: REMOVAL OF STONES;  Surgeon: Lemar Lofty., MD;  Location: [facility] ENDOSCOPY;  Service: Gastroenterology;;   Gaspar Bidding DILATION N/A 09/12/2018   Procedure: Jacklyn Shell;  Surgeon: Lemar Lofty., MD;  Location: [facility] ENDOSCOPY;  Service: Gastroenterology;  Laterality: N/A;   SAVORY DILATION N/A 11/28/2018   Procedure: SAVORY DILATION;  Surgeon: Meridee Score Netty Starring., MD;  Location: [facility] ENDOSCOPY;  Service:  Gastroenterology;  Laterality: N/A;   SEPTOPLASTY Bilateral 05/26/2021   Procedure: SEPTOPLASTY;  Surgeon: Newman Pies, MD;  Location: [facility];  Service: ENT;  Laterality: Bilateral;   SPHINCTEROTOMY  04/01/2018   Procedure: Dennison Mascot;  Surgeon: Mansouraty, Netty Starring., MD;  Location: [facility] ENDOSCOPY;  Service: Gastroenterology;;   Burman Freestone CHOLANGIOSCOPY N/A 11/28/2018   Procedure: ZOXWRUEA CHOLANGIOSCOPY;  Surgeon: Lemar Lofty., MD;  Location: [facility] ENDOSCOPY;  Service: Gastroenterology;  Laterality: N/A;   SPYGLASS CHOLANGIOSCOPY N/A 06/03/2020   Procedure: SPYGLASS CHOLANGIOSCOPY;  Surgeon: Lemar Lofty., MD;  Location: [facility] ENDOSCOPY;  Service: Gastroenterology;  Laterality: N/A;   STENT REMOVAL  09/12/2018   Procedure: STENT REMOVAL;  Surgeon: Lemar Lofty., MD;  Location: [facility] ENDOSCOPY;  Service: Gastroenterology;;   Francine Graven REMOVAL  11/28/2018   Procedure: STENT REMOVAL;  Surgeon: Lemar Lofty., MD;  Location: [facility] ENDOSCOPY;  Service: Gastroenterology;;   Francine Graven REMOVAL  03/11/2020   Procedure: STENT REMOVAL;  Surgeon: Lemar Lofty., MD;  Location: [facility] ENDOSCOPY;  Service: Gastroenterology;;   SUBMUCOSAL LIFTING INJECTION  09/12/2018   Procedure: SUBMUCOSAL LIFTING INJECTION;  Surgeon: Lemar Lofty., MD;  Location: [facility] ENDOSCOPY;  Service: Gastroenterology;;    Allergies  No Known Allergies   Labs/Other Studies Reviewed    The following studies were reviewed today: Echo 04/2022: IMPRESSIONS     1. Left ventricular ejection fraction, by estimation, is 50 to 55%. The  left ventricle has low normal function. The left ventricle has no regional  wall motion abnormalities. Left ventricular diastolic parameters were  normal.   2. Right ventricular systolic function is normal. The right ventricular  size is normal.   3. Left atrial size was moderately dilated.   4. The mitral valve is abnormal. Mild mitral valve  regurgitation. No  evidence of mitral stenosis.   5. The aortic valve is tricuspid. There is mild calcification of the  aortic valve. There is mild thickening of the aortic valve. Aortic valve  regurgitation is trivial. Aortic valve sclerosis is present, with no  evidence of aortic valve stenosis.   6. Aortic dilatation noted. There is mild dilatation of the ascending  aorta, measuring 39 mm.   7. The inferior vena cava is normal in size with greater than 50%  respiratory variability, suggesting right atrial pressure of 3 mmHg.   Eugenie Birks Myoview 04/2022:      Findings are consistent with infarction. The study is intermediate risk.   No ST deviation was noted.   Left ventricular function is normal. Nuclear stress EF: 56 %. The left ventricular ejection fraction is normal (55-65%). End diastolic cavity size is normal.   Prior study available for comparison from 01/04/2018.   Moderate size and intensity fixed perfusion defect in the inferior wall basal-mid c/f scar. However, appears worse at rest and there's no specific wall motion abnormalities, may have an artifact component. No inducible ischemia  Recent Labs: 05/22/2022: ALT 11; BUN 28; Creatinine, Ser 1.87; Hemoglobin 12.6; Platelets 167; Potassium 3.9; Sodium 140  Recent Lipid Panel    Component Value Date/Time   CHOL 118 12/09/2020 0853   CHOL 116 03/30/2013 1053   TRIG 104 12/09/2020 0853   TRIG 80 03/30/2013 1053   HDL 36 (L) 12/09/2020 0853   HDL 34 (L) 03/30/2013 1053   CHOLHDL 3.3 12/09/2020 0853   CHOLHDL 2.5 03/31/2018 0508   VLDL 14 03/31/2018 0508   LDLCALC 62 12/09/2020 0853   LDLCALC 66 03/30/2013 1053   LDLDIRECT 90.3 04/17/2008 0911    History of Present Illness    82 year old male with the above past medical history including CAD s/p CABG x 4 in 2001, DESx3 overlapping-SVG-RPDA/RPL in 2020, hypertension, hyperlipidemia, OSA not on CPAP, type 2 diabetes, bladder cancer, R renal cell carcinoma, COPD, and  GERD.  He has had stable chronic dyspnea over the years.  Cardiac catheterization in 12/2018 revealed 90% subtotally occluded SVG-RPDA/RPL that was treated with 3 overlapping stents.  Echocardiogram at the time showed EF 40 to 40-45%, severe hypokinesis of the entire inferior and inferoseptal wall, G1 DD, mild MR, unable to exclude ASD/PFO.  He was last seen in the office on 04-2022 was stable from a cardiac standpoint.  He was pending robot-assisted laparoscopic nephro ureterectomy with Dr. Sebastian Ache on 06/03/2022 for renal cell cancer.  Lexiscan Myoview as part of preoperative cardiac evaluation was consistent with prior infarction with fixed inferior wall defect, EF 56%, no inducible ischemia.  Repeat echocardiogram in 04/2022 showed EF 50 to 55%, low normal LV function, normal RV systolic function, mild mitral valve regurgitation, aortic valve sclerosis with no evidence of aortic stenosis, mild dilation of the ascending aorta measuring 39 mm.  He presents today for follow-up accompanied by his daughters.  Since his last visit he has been stable from a cardiac standpoint. He notes stable chronic dyspnea, he denies chest pain, palpitations, dizziness, edema, PND, orthopnea, weight gain.  He stays active and continues to mow his own yard, does yard work, and prepares cars.  Overall, he reports feeling well.  Home Medications    Current Outpatient Medications  Medication Sig Dispense Refill   acetaminophen (TYLENOL) 500 MG tablet Take 1,000 mg by mouth every 6 (six) hours as needed for moderate pain.     amLODipine (NORVASC) 2.5 MG tablet TAKE 2 TABLETS BY MOUTH DAILY 180 tablet 3   aspirin EC 81 MG tablet Take 81 mg by mouth every evening. Swallow whole.     B-D 3CC LUER-LOK SYR 23GX1" 23G X 1" 3 ML MISC USE WITH B12 INJECTION 1 each 0   cetirizine (ZYRTEC) 10 MG tablet Take 10 mg by mouth daily as needed for allergies.     clopidogrel (PLAVIX) 75 MG tablet TAKE 1 TABLET BY MOUTH ONCE  DAILY 60  tablet 5   Coenzyme Q10 300 MG CAPS Take 300 mg by mouth every evening.     cyanocobalamin (VITAMIN B12) 1000 MCG/ML injection INJECT 1 ML SUBCUTANEOUSLY ONCE EVERY MONTH 6 mL 11   ezetimibe (ZETIA) 10 MG tablet TAKE 1 TABLET BY MOUTH DAILY 100 tablet 1   Ferrous Sulfate (IRON PO) Take 1 tablet by mouth in the morning.     fluticasone (FLONASE) 50 MCG/ACT nasal spray Place 1 spray into both nostrils daily as needed for allergies.     isosorbide mononitrate (IMDUR) 60 MG 24 hr tablet TAKE 1 TABLET BY MOUTH DAILY 90 tablet 3   Menthol, Topical Analgesic, (BENGAY EX) Apply 1 application topically daily as needed (pain).     METAMUCIL FIBER PO Take 1 capsule by mouth in the morning.     Multiple Vitamins-Minerals (MULTIVITAMIN WITH MINERALS) tablet Take 1 tablet by mouth daily. Gummy     Naphazoline-Pheniramine (OPCON-A) 0.027-0.315 % SOLN Place 1 drop into both eyes daily as needed (itching eyes).     nebivolol (BYSTOLIC) 10 MG tablet TAKE 1 TABLET BY MOUTH DAILY 100 tablet 3   NEEDLE, DISP, 25 G (B-D DISP NEEDLE 25GX1") 25G X 1" MISC Inject 1000 mcg into muscle once a month. 50 each 0   nitroGLYCERIN (NITROSTAT) 0.4 MG SL tablet DISSOLVE ONE TABLET UNDER THE TONGUE EVERY 5 MINUTES AS NEEDED FOR CHEST PAIN. 25 tablet 0   oxyCODONE-acetaminophen (PERCOCET) 10-325 MG tablet Take 1 tablet by mouth 2 (two) times daily as needed for pain. 60 tablet 0   pantoprazole (PROTONIX) 40 MG tablet Take 1 tablet (40 mg total) by mouth 2 (two) times daily. 90 tablet 3   Probiotic Product (PROBIOTIC GUMMIES PO) Take 1 capsule by mouth in the morning.     rOPINIRole (REQUIP) 2 MG tablet TAKE 1 TABLET BY MOUTH AT  BEDTIME 100 tablet 2   rosuvastatin (CRESTOR) 10 MG tablet TAKE 1 TABLET BY MOUTH DAILY 60 tablet 5   venlafaxine (EFFEXOR) 75 MG tablet Take 1 tablet (75 mg total) by mouth daily. 90 tablet 1   sucralfate (CARAFATE) 1 GM/10ML suspension Take 10 mLs (1 g total) by mouth 2 (two) times daily. (Patient not  taking: Reported on 05/21/2022) 420 mL 1   No current facility-administered medications for this visit.     Review of Systems    He denies chest pain, palpitations, pnd, orthopnea, n, v, dizziness, syncope, edema, weight gain, or early satiety. All other systems reviewed and are otherwise negative except as noted above.   Physical Exam    VS:  BP 112/62   Pulse (!) 59   Ht 5\' 7"  (1.702 m)   Wt 168 lb 3.2 oz (76.3 kg)  SpO2 96%   BMI 26.34 kg/m   GEN: Well nourished, well developed, in no acute distress. HEENT: normal. Neck: Supple, no JVD, carotid bruits, or masses. Cardiac: RRR, no murmurs, rubs, or gallops. No clubbing, cyanosis, edema.  Radials/DP/PT 2+ and equal bilaterally.  Respiratory:  Respirations regular and unlabored, clear to auscultation bilaterally. GI: Soft, nontender, nondistended, BS + x 4. MS: no deformity or atrophy. Skin: warm and dry, no rash. Neuro:  Strength and sensation are intact. Psych: Normal affect.  Accessory Clinical Findings    ECG personally reviewed by me today -sinus bradycardia, 59 bpm- no acute changes.   Lab Results  Component Value Date   WBC 7.5 05/22/2022   HGB 12.6 (L) 05/22/2022   HCT 39.7 05/22/2022   MCV 89.2 05/22/2022   PLT 167 05/22/2022   Lab Results  Component Value Date   CREATININE 1.87 (H) 05/22/2022   BUN 28 (H) 05/22/2022   NA 140 05/22/2022   K 3.9 05/22/2022   CL 106 05/22/2022   CO2 27 05/22/2022   Lab Results  Component Value Date   ALT 11 05/22/2022   AST 18 05/22/2022   ALKPHOS 56 05/22/2022   BILITOT 0.6 05/22/2022   Lab Results  Component Value Date   CHOL 118 12/09/2020   HDL 36 (L) 12/09/2020   LDLCALC 62 12/09/2020   LDLDIRECT 90.3 04/17/2008   TRIG 104 12/09/2020   CHOLHDL 3.3 12/09/2020    Lab Results  Component Value Date   HGBA1C 6.0 (H) 05/22/2022    Assessment & Plan    1. CAD: S/p CABG x 4 in 2001, DESx3 overlapping-SVG-RPDA/RPL in 2020. Recent lexiscan Myoview was  consistent with prior infarction with fixed inferior wall defect, EF 56%, no inducible ischemia.  Repeat echocardiogram in 04/2022 showed EF 50 to 55%, low normal LV function, normal RV systolic function, mild mitral valve regurgitation, aortic valve sclerosis with no evidence of aortic stenosis, mild dilation of the ascending aorta measuring 39 mm.  He has stable chronic dyspnea, denies any other symptoms concerning for angina. Recent cardiac evaluation overall reassuring.  Continue aspirin, Plavix, amlodipine, nebivolol, Imdur, Crestor, and Zetia.  2. Hypertension: BP well controlled. Continue current antihypertensive regimen.   3. Hyperlipidemia: LDL was 62 in 11/2020.  Due for repeat labs.  Consider at next follow-up visit or at follow-up with PCP.  Continue Crestor, Zetia.  4. OSA: Not on CPAP.  5. Type 2 diabetes: A1c was 6.0 in 05/2022.  Monitored and managed per PCP.  6. Preoperative cardiac exam: According to the Revised Cardiac Risk Index (RCRI), his Perioperative Risk of Major Cardiac Event is (%): 0.9. His Functional Capacity in METs is: 4.95 according to the Duke Activity Status Index (DASI).  Has stable chronic dyspnea.  Recent [employer] consistent with prior infarction with fixed inferior wall defect, no inducible ischemia.  Echocardiogram showed EF 50 to 55%, mild mitral valve regurgitation.  Overall reassuring.  Therefore, based on ACC/AHA guidelines, patient would be at acceptable risk for the planned procedure without further cardiovascular testing.  Per office protocol, he may hold Plavix for 5 days prior to procedure.  Please resume Plavix as soon as possible postprocedure, the discretion of the surgeon.  Regarding ASA therapy, we recommend continuation of ASA throughout the perioperative period.  However, if the surgeon feels that cessation of ASA is required in the perioperative period, it may be stopped 5-7 days prior to surgery with a plan to resume it as soon as felt to  be  feasible from a surgical standpoint in the post-operative period. I will route this recommendation to the requesting party via Epic fax function.  7. Disposition: Follow-up in 6 months.       Joylene Grapes, NP 05/27/2022, 10:32 AM

## 2022-06-02 ENCOUNTER — Encounter: Payer: Self-pay | Admitting: Internal Medicine

## 2022-06-02 ENCOUNTER — Ambulatory Visit (INDEPENDENT_AMBULATORY_CARE_PROVIDER_SITE_OTHER): Payer: Medicare Other | Admitting: Internal Medicine

## 2022-06-02 VITALS — BP 130/70 | HR 60 | Temp 98.2°F | Ht 68.0 in | Wt 171.3 lb

## 2022-06-02 DIAGNOSIS — Z125 Encounter for screening for malignant neoplasm of prostate: Secondary | ICD-10-CM | POA: Diagnosis not present

## 2022-06-02 DIAGNOSIS — Z7984 Long term (current) use of oral hypoglycemic drugs: Secondary | ICD-10-CM | POA: Diagnosis not present

## 2022-06-02 DIAGNOSIS — E038 Other specified hypothyroidism: Secondary | ICD-10-CM | POA: Diagnosis not present

## 2022-06-02 DIAGNOSIS — G894 Chronic pain syndrome: Secondary | ICD-10-CM | POA: Diagnosis not present

## 2022-06-02 DIAGNOSIS — R7989 Other specified abnormal findings of blood chemistry: Secondary | ICD-10-CM

## 2022-06-02 DIAGNOSIS — E119 Type 2 diabetes mellitus without complications: Secondary | ICD-10-CM | POA: Diagnosis not present

## 2022-06-02 DIAGNOSIS — I10 Essential (primary) hypertension: Secondary | ICD-10-CM | POA: Diagnosis not present

## 2022-06-02 DIAGNOSIS — E538 Deficiency of other specified B group vitamins: Secondary | ICD-10-CM | POA: Diagnosis not present

## 2022-06-02 DIAGNOSIS — Z0000 Encounter for general adult medical examination without abnormal findings: Secondary | ICD-10-CM | POA: Diagnosis not present

## 2022-06-02 DIAGNOSIS — E785 Hyperlipidemia, unspecified: Secondary | ICD-10-CM | POA: Diagnosis not present

## 2022-06-02 LAB — CBC WITH DIFFERENTIAL/PLATELET
Basophils Absolute: 0.1 10*3/uL (ref 0.0–0.1)
Basophils Relative: 0.8 % (ref 0.0–3.0)
Eosinophils Absolute: 0.3 10*3/uL (ref 0.0–0.7)
Eosinophils Relative: 4.9 % (ref 0.0–5.0)
HCT: 39.3 % (ref 39.0–52.0)
Hemoglobin: 13.1 g/dL (ref 13.0–17.0)
Lymphocytes Relative: 30.4 % (ref 12.0–46.0)
Lymphs Abs: 1.9 10*3/uL (ref 0.7–4.0)
MCHC: 33.4 g/dL (ref 30.0–36.0)
MCV: 86.7 fl (ref 78.0–100.0)
Monocytes Absolute: 0.5 10*3/uL (ref 0.1–1.0)
Monocytes Relative: 7.7 % (ref 3.0–12.0)
Neutro Abs: 3.6 10*3/uL (ref 1.4–7.7)
Neutrophils Relative %: 56.2 % (ref 43.0–77.0)
Platelets: 158 10*3/uL (ref 150.0–400.0)
RBC: 4.53 Mil/uL (ref 4.22–5.81)
RDW: 14.3 % (ref 11.5–15.5)
WBC: 6.3 10*3/uL (ref 4.0–10.5)

## 2022-06-02 LAB — COMPREHENSIVE METABOLIC PANEL
ALT: 7 U/L (ref 0–53)
AST: 16 U/L (ref 0–37)
Albumin: 3.9 g/dL (ref 3.5–5.2)
Alkaline Phosphatase: 58 U/L (ref 39–117)
BUN: 20 mg/dL (ref 6–23)
CO2: 31 mEq/L (ref 19–32)
Calcium: 9.2 mg/dL (ref 8.4–10.5)
Chloride: 104 mEq/L (ref 96–112)
Creatinine, Ser: 1.54 mg/dL — ABNORMAL HIGH (ref 0.40–1.50)
GFR: 42.48 mL/min — ABNORMAL LOW (ref >60.00)
Glucose, Bld: 129 mg/dL — ABNORMAL HIGH (ref 70–99)
Potassium: 4.6 mEq/L (ref 3.5–5.1)
Sodium: 142 mEq/L (ref 135–145)
Total Bilirubin: 0.5 mg/dL (ref 0.2–1.2)
Total Protein: 6.8 g/dL (ref 6.0–8.3)

## 2022-06-02 LAB — LIPID PANEL
Cholesterol: 121 mg/dL (ref 0–200)
HDL: 35.6 mg/dL — ABNORMAL LOW (ref >39.00)
LDL Cholesterol: 61 mg/dL (ref 0–99)
NonHDL: 85.59
Total CHOL/HDL Ratio: 3
Triglycerides: 123 mg/dL (ref 0.0–149.0)
VLDL: 24.6 mg/dL (ref 0.0–40.0)

## 2022-06-02 LAB — MICROALBUMIN / CREATININE URINE RATIO
Creatinine,U: 86.5 mg/dL
Microalb Creat Ratio: 51.7 mg/g — ABNORMAL HIGH (ref 0.0–30.0)
Microalb, Ur: 44.7 mg/dL — ABNORMAL HIGH (ref 0.0–1.9)

## 2022-06-02 LAB — PSA: PSA: 0.21 ng/mL (ref 0.10–4.00)

## 2022-06-02 LAB — VITAMIN B12: Vitamin B-12: 466 pg/mL (ref 211–911)

## 2022-06-02 LAB — HEMOGLOBIN A1C: Hgb A1c MFr Bld: 6.1 % (ref 4.6–6.5)

## 2022-06-02 MED ORDER — CLOBETASOL PROPIONATE 0.05 % EX SOLN: 1.0000 | Freq: Two times a day (BID) | CUTANEOUS | 0 refills | Status: DC

## 2022-06-02 MED ORDER — OXYCODONE-ACETAMINOPHEN 10-325 MG PO TABS
1.0000 | ORAL_TABLET | Freq: Two times a day (BID) | ORAL | 0 refills | Status: DC | PRN
Start: 2022-06-02 — End: 2022-06-05

## 2022-06-02 MED ORDER — OXYCODONE-ACETAMINOPHEN 10-325 MG PO TABS
1.0000 | ORAL_TABLET | Freq: Two times a day (BID) | ORAL | 0 refills | Status: DC | PRN
Start: 2022-06-02 — End: 2022-06-03

## 2022-06-02 NOTE — Progress Notes (Signed)
Established Patient Office Visit     CC/Reason for Visit: Annual preventive exam and subsequent Medicare wellness visit, medication refill  HPI: [name] is a 82 y.o. male who is coming in today for the above mentioned reasons. Past Medical History is significant for: Type 2 diabetes that has been well controlled, heart failure with preserved ejection fraction, chronic pain syndrome with multiple joint pain including neck, shoulders, hands, hips, knees.  He has a history of ascending cholangitis and pancreatitis.  He was recently diagnosed with right renal cell carcinoma and is going for nephrectomy tomorrow.  She is otherwise feeling well.  He is due for his pain medication refills.  He has routine eye and dental care.  He is due for shingles and RSV vaccines.   Past Medical/Surgical History: Past Medical History:  Diagnosis Date   Allergy    Anxiety    B12 deficiency anemia    Blood transfusion without reported diagnosis    CAD (coronary artery disease)    Cancer (HCC)    bladder, Right renal cell also   Chronic kidney disease    Colon polyps    COPD (chronic obstructive pulmonary disease) (HCC)    Depression    Diabetes mellitus (HCC)    Dyspnea    Esophagus, Barrett's    GERD (gastroesophageal reflux disease)    Headache    History of bladder cancer    Bladder cancer "8 times"   History of hiatal hernia    Hyperlipidemia    Hypertension    Localized osteoarthrosis, lower leg    Myocardial infarction (HCC) 2021   Pneumonia    Restless leg syndrome    Sleep apnea    does not wear cpap   Stenosis of esophagus     Past Surgical History:  Procedure Laterality Date   BILIARY BRUSHING  04/01/2018   Procedure: BILIARY BRUSHING;  Surgeon: Lemar Lofty., MD;  Location: [facility] ENDOSCOPY;  Service: Gastroenterology;;   BILIARY BRUSHING  09/12/2018   Procedure: BILIARY BRUSHING;  Surgeon: Lemar Lofty., MD;  Location: [facility] ENDOSCOPY;  Service:  Gastroenterology;;   BILIARY BRUSHING  11/28/2018   Procedure: BILIARY BRUSHING;  Surgeon: Lemar Lofty., MD;  Location: [facility] ENDOSCOPY;  Service: Gastroenterology;;   BILIARY BRUSHING  03/11/2020   Procedure: BILIARY BRUSHING;  Surgeon: Lemar Lofty., MD;  Location: [facility] ENDOSCOPY;  Service: Gastroenterology;;   BILIARY DILATION  09/12/2018   Procedure: BILIARY DILATION;  Surgeon: Lemar Lofty., MD;  Location: [facility] ENDOSCOPY;  Service: Gastroenterology;;   BILIARY DILATION  11/28/2018   Procedure: BILIARY DILATION;  Surgeon: Lemar Lofty., MD;  Location: [facility] ENDOSCOPY;  Service: Gastroenterology;;   BILIARY DILATION  03/11/2020   Procedure: BILIARY DILATION;  Surgeon: Lemar Lofty., MD;  Location: [facility] ENDOSCOPY;  Service: Gastroenterology;;   BILIARY STENT PLACEMENT  04/01/2018   Procedure: BILIARY STENT PLACEMENT;  Surgeon: Lemar Lofty., MD;  Location: [facility] ENDOSCOPY;  Service: Gastroenterology;;   BILIARY STENT PLACEMENT  09/12/2018   Procedure: BILIARY STENT PLACEMENT;  Surgeon: Lemar Lofty., MD;  Location: [facility] ENDOSCOPY;  Service: Gastroenterology;;   BILIARY STENT PLACEMENT  11/28/2018   Procedure: BILIARY STENT PLACEMENT;  Surgeon: Lemar Lofty., MD;  Location: [facility] ENDOSCOPY;  Service: Gastroenterology;;   BIOPSY  04/01/2018   Procedure: BIOPSY;  Surgeon: Lemar Lofty., MD;  Location: [facility] ENDOSCOPY;  Service: Gastroenterology;;   BIOPSY  09/12/2018   Procedure: BIOPSY;  Surgeon: Lemar Lofty., MD;  Location: [facility] ENDOSCOPY;  Service: Gastroenterology;;   BIOPSY  11/28/2018   Procedure: BIOPSY;  Surgeon: Lemar Lofty., MD;  Location: [facility] ENDOSCOPY;  Service: Gastroenterology;;   BIOPSY  03/11/2020   Procedure: BIOPSY;  Surgeon: Lemar Lofty., MD;  Location: [facility] ENDOSCOPY;  Service: Gastroenterology;;   bladder cancer      x 8 cystoscopy   CERVICAL DISCECTOMY     ACDF    CHOLECYSTECTOMY     COLONOSCOPY  11/17/2005   normal    CORONARY ARTERY BYPASS GRAFT     x4   CORONARY STENT INTERVENTION N/A 01/05/2019   Procedure: CORONARY STENT INTERVENTION;  Surgeon: Yvonne Kendall, MD;  Location: [facility] INVASIVE CV LAB;  Service: Cardiovascular;  Laterality: N/A;   CYSTOSCOPY WITH BIOPSY N/A 03/27/2022   Procedure: CYSTOSCOPY WITH BIOPSY;  Surgeon: Bjorn Pippin, MD;  Location: [facility] ORS;  Service: Urology;  Laterality: N/A;   CYSTOSCOPY WITH RETROGRADE PYELOGRAM, URETEROSCOPY AND STENT PLACEMENT Right 03/27/2022   Procedure: CYSTOSCOPY WITH RIGHT RETROGRADE PYELOGRAM, URETEROSCOPY AND STENT PLACEMENT urethral dilation;  Surgeon: Bjorn Pippin, MD;  Location: [facility] ORS;  Service: Urology;  Laterality: Right;   ENDOSCOPIC MUCOSAL RESECTION  09/12/2018   Procedure: ENDOSCOPIC MUCOSAL RESECTION;  Surgeon: Meridee Score Netty Starring., MD;  Location: [facility] ENDOSCOPY;  Service: Gastroenterology;;   ENDOSCOPIC RETROGRADE CHOLANGIOPANCREATOGRAPHY (ERCP) WITH PROPOFOL N/A 04/01/2018   Procedure: ENDOSCOPIC RETROGRADE CHOLANGIOPANCREATOGRAPHY (ERCP) WITH PROPOFOL;  Surgeon: Lemar Lofty., MD;  Location: [facility] ENDOSCOPY;  Service: Gastroenterology;  Laterality: N/A;   ENDOSCOPIC RETROGRADE CHOLANGIOPANCREATOGRAPHY (ERCP) WITH PROPOFOL N/A 09/12/2018   Procedure: ENDOSCOPIC RETROGRADE CHOLANGIOPANCREATOGRAPHY (ERCP) WITH PROPOFOL;  Surgeon: Meridee Score Netty Starring., MD;  Location: [facility] ENDOSCOPY;  Service: Gastroenterology;  Laterality: N/A;   ENDOSCOPIC RETROGRADE CHOLANGIOPANCREATOGRAPHY (ERCP) WITH PROPOFOL N/A 03/11/2020   Procedure: ENDOSCOPIC RETROGRADE CHOLANGIOPANCREATOGRAPHY (ERCP) WITH PROPOFOL;  Surgeon: Meridee Score Netty Starring., MD;  Location: [facility] ENDOSCOPY;  Service: Gastroenterology;  Laterality: N/A;   ENDOSCOPIC RETROGRADE CHOLANGIOPANCREATOGRAPHY (ERCP) WITH PROPOFOL N/A 06/03/2020   Procedure: ENDOSCOPIC RETROGRADE CHOLANGIOPANCREATOGRAPHY (ERCP) WITH PROPOFOL;  Surgeon: Meridee Score  Netty Starring., MD;  Location: [facility] ENDOSCOPY;  Service: Gastroenterology;  Laterality: N/A;   ERCP N/A 11/28/2018   Procedure: ENDOSCOPIC RETROGRADE CHOLANGIOPANCREATOGRAPHY (ERCP) +EGD with spyglass;  Surgeon: Meridee Score Netty Starring., MD;  Location: [facility] ENDOSCOPY;  Service: Gastroenterology;  Laterality: N/A;   ESOPHAGOGASTRODUODENOSCOPY  04/29/2010   ESOPHAGOGASTRODUODENOSCOPY (EGD) WITH PROPOFOL N/A 04/01/2018   Procedure: ESOPHAGOGASTRODUODENOSCOPY (EGD) WITH PROPOFOL;  Surgeon: Meridee Score Netty Starring., MD;  Location: [facility] ENDOSCOPY;  Service: Gastroenterology;  Laterality: N/A;   ESOPHAGOGASTRODUODENOSCOPY (EGD) WITH PROPOFOL N/A 09/12/2018   Procedure: ESOPHAGOGASTRODUODENOSCOPY (EGD) WITH PROPOFOL;  Surgeon: Meridee Score Netty Starring., MD;  Location: [facility] ENDOSCOPY;  Service: Gastroenterology;  Laterality: N/A;   ESOPHAGOGASTRODUODENOSCOPY (EGD) WITH PROPOFOL N/A 11/28/2018   Procedure: ESOPHAGOGASTRODUODENOSCOPY (EGD) WITH PROPOFOL;  Surgeon: Meridee Score Netty Starring., MD;  Location: [facility] ENDOSCOPY;  Service: Gastroenterology;  Laterality: N/A;   ESOPHAGOGASTRODUODENOSCOPY (EGD) WITH PROPOFOL N/A 03/11/2020   Procedure: ESOPHAGOGASTRODUODENOSCOPY (EGD) WITH PROPOFOL;  Surgeon: Meridee Score Netty Starring., MD;  Location: [facility] ENDOSCOPY;  Service: Gastroenterology;  Laterality: N/A;   ESOPHAGOGASTRODUODENOSCOPY (EGD) WITH PROPOFOL N/A 06/03/2020   Procedure: ESOPHAGOGASTRODUODENOSCOPY (EGD) WITH PROPOFOL;  Surgeon: Meridee Score Netty Starring., MD;  Location: [facility] ENDOSCOPY;  Service: Gastroenterology;  Laterality: N/A;   EUS  04/01/2018   Procedure: FULL UPPER ENDOSCOPIC ULTRASOUND (EUS) RADIAL;  Surgeon: Lemar Lofty., MD;  Location: [facility] ENDOSCOPY;  Service: Gastroenterology;;   EUS N/A 09/12/2018   Procedure: UPPER ENDOSCOPIC ULTRASOUND (EUS) RADIAL;  Surgeon: Lemar Lofty., MD;  Location: [facility] ENDOSCOPY;  Service: Gastroenterology;  Laterality: N/A;   FINE NEEDLE ASPIRATION  09/12/2018   Procedure:  FINE NEEDLE ASPIRATION (FNA) LINEAR;  Surgeon: Lemar Lofty., MD;  Location: [facility] ENDOSCOPY;  Service: Gastroenterology;;   HAND SURGERY Left 2018   saw accident   HEMOSTASIS CLIP PLACEMENT  09/12/2018   Procedure: HEMOSTASIS CLIP PLACEMENT;  Surgeon: Lemar Lofty., MD;  Location: [facility] ENDOSCOPY;  Service: Gastroenterology;;   HEMOSTASIS CLIP PLACEMENT  06/03/2020   Procedure: HEMOSTASIS CLIP PLACEMENT;  Surgeon: Lemar Lofty., MD;  Location: [facility] ENDOSCOPY;  Service: Gastroenterology;;   I & D EXTREMITY Left 11/24/2016   Procedure: IRRIGATION AND DEBRIDEMENT LEFT HAND, THUMB, INDEX, MIDDLE, RING, AND SMALL FINGERS WITH RECONSTRUCTION;  Surgeon: Dominica Severin, MD;  Location: [facility] OR;  Service: Orthopedics;  Laterality: Left;   KNEE ARTHROSCOPY Left    LEFT HEART CATH AND CORS/GRAFTS ANGIOGRAPHY N/A 01/05/2019   Procedure: LEFT HEART CATH AND CORS/GRAFTS ANGIOGRAPHY;  Surgeon: Yvonne Kendall, MD;  Location: [facility] INVASIVE CV LAB;  Service: Cardiovascular;  Laterality: N/A;   LUMBAR LAMINECTOMY     and fusion x 2   NASAL SINUS SURGERY     POPLITEAL SYNOVIAL CYST EXCISION     REMOVAL OF STONES  04/01/2018   Procedure: REMOVAL OF STONES;  Surgeon: Meridee Score Netty Starring., MD;  Location: [facility] ENDOSCOPY;  Service: Gastroenterology;;   REMOVAL OF STONES  09/12/2018   Procedure: REMOVAL OF STONES;  Surgeon: Lemar Lofty., MD;  Location: [facility] ENDOSCOPY;  Service: Gastroenterology;;   REMOVAL OF STONES  11/28/2018   Procedure: REMOVAL OF STONES;  Surgeon: Lemar Lofty., MD;  Location: [facility] ENDOSCOPY;  Service: Gastroenterology;;   REMOVAL OF STONES  03/11/2020   Procedure: REMOVAL OF STONES;  Surgeon: Lemar Lofty., MD;  Location: [facility] ENDOSCOPY;  Service: Gastroenterology;;   REMOVAL OF STONES  06/03/2020   Procedure: REMOVAL OF STONES;  Surgeon: Lemar Lofty., MD;  Location: [facility] ENDOSCOPY;  Service: Gastroenterology;;   Gaspar Bidding DILATION N/A  09/12/2018   Procedure: Jacklyn Shell;  Surgeon: Lemar Lofty., MD;  Location: [facility] ENDOSCOPY;  Service: Gastroenterology;  Laterality: N/A;   SAVORY DILATION N/A 11/28/2018   Procedure: SAVORY DILATION;  Surgeon: Meridee Score Netty Starring., MD;  Location: [facility] ENDOSCOPY;  Service: Gastroenterology;  Laterality: N/A;   SEPTOPLASTY Bilateral 05/26/2021   Procedure: SEPTOPLASTY;  Surgeon: Newman Pies, MD;  Location: [facility];  Service: ENT;  Laterality: Bilateral;   SPHINCTEROTOMY  04/01/2018   Procedure: Dennison Mascot;  Surgeon: Mansouraty, Netty Starring., MD;  Location: [facility] ENDOSCOPY;  Service: Gastroenterology;;   Burman Freestone CHOLANGIOSCOPY N/A 11/28/2018   Procedure: [id number] CHOLANGIOSCOPY;  Surgeon: Lemar Lofty., MD;  Location: [facility] ENDOSCOPY;  Service: Gastroenterology;  Laterality: N/A;   SPYGLASS CHOLANGIOSCOPY N/A 06/03/2020   Procedure: SPYGLASS CHOLANGIOSCOPY;  Surgeon: Lemar Lofty., MD;  Location: [facility] ENDOSCOPY;  Service: Gastroenterology;  Laterality: N/A;   STENT REMOVAL  09/12/2018   Procedure: STENT REMOVAL;  Surgeon: Lemar Lofty., MD;  Location: [facility] ENDOSCOPY;  Service: Gastroenterology;;   Francine Graven REMOVAL  11/28/2018   Procedure: STENT REMOVAL;  Surgeon: Lemar Lofty., MD;  Location: [facility] ENDOSCOPY;  Service: Gastroenterology;;   Francine Graven REMOVAL  03/11/2020   Procedure: STENT REMOVAL;  Surgeon: Lemar Lofty., MD;  Location: [facility] ENDOSCOPY;  Service: Gastroenterology;;   SUBMUCOSAL LIFTING INJECTION  09/12/2018   Procedure: SUBMUCOSAL LIFTING INJECTION;  Surgeon: Lemar Lofty., MD;  Location: [facility] ENDOSCOPY;  Service: Gastroenterology;;    Social History:  reports that he quit smoking about 46 years ago. His smoking use included cigarettes. He has a 2.50 pack-year smoking history. He has never used smokeless tobacco. He reports that he does not drink alcohol and does not use drugs.  Allergies: No Known  Allergies  Family History:  Family History  Problem Relation Age of Onset   Melanoma Mother    Stroke Father    Hypertension Father    Coronary artery disease Other    Colon cancer Neg Hx    Esophageal cancer Neg Hx    Stomach cancer Neg Hx    Rectal cancer Neg Hx    Pancreatic cancer Neg Hx    Liver disease Neg Hx    Inflammatory bowel disease Neg Hx      Current Outpatient Medications:    acetaminophen (TYLENOL) 500 MG tablet, Take 1,000 mg by mouth every 6 (six) hours as needed for moderate pain., Disp: , Rfl:    amLODipine (NORVASC) 2.5 MG tablet, TAKE 2 TABLETS BY MOUTH DAILY, Disp: 180 tablet, Rfl: 3   aspirin EC 81 MG tablet, Take 81 mg by mouth every evening. Swallow whole., Disp: , Rfl:    B-D 3CC LUER-LOK SYR 23GX1" 23G X 1" 3 ML MISC, USE WITH B12 INJECTION, Disp: 1 each, Rfl: 0   cetirizine (ZYRTEC) 10 MG tablet, Take 10 mg by mouth daily as needed for allergies., Disp: , Rfl:    clobetasol (TEMOVATE) 0.05 % external solution, Apply 1 Application topically 2 (two) times daily., Disp: 50 mL, Rfl: 0   clopidogrel (PLAVIX) 75 MG tablet, TAKE 1 TABLET BY MOUTH ONCE  DAILY, Disp: 60 tablet, Rfl: 5   Coenzyme Q10 300 MG CAPS, Take 300 mg by mouth every evening., Disp: , Rfl:    cyanocobalamin (VITAMIN B12) 1000 MCG/ML injection, INJECT 1 ML SUBCUTANEOUSLY ONCE EVERY MONTH, Disp: 6 mL, Rfl: 11   ezetimibe (ZETIA) 10 MG tablet, TAKE 1 TABLET BY MOUTH DAILY, Disp: 100 tablet, Rfl: 1   Ferrous Sulfate (IRON PO), Take 1 tablet by mouth in the morning., Disp: , Rfl:    fluticasone (FLONASE) 50 MCG/ACT nasal spray, Place 1 spray into both nostrils daily as needed for allergies., Disp: , Rfl:    isosorbide mononitrate (IMDUR) 60 MG 24 hr tablet, TAKE 1 TABLET BY MOUTH DAILY, Disp: 90 tablet, Rfl: 3   Menthol, Topical Analgesic, (BENGAY EX), Apply 1 application topically daily as needed (pain)., Disp: , Rfl:    METAMUCIL FIBER PO, Take 1 capsule by mouth in the morning., Disp: , Rfl:     Multiple Vitamins-Minerals (MULTIVITAMIN WITH MINERALS) tablet, Take 1 tablet by mouth daily. Gummy, Disp: , Rfl:    Naphazoline-Pheniramine (OPCON-A) 0.027-0.315 % SOLN, Place 1 drop into both eyes daily as needed (itching eyes)., Disp: , Rfl:    nebivolol (BYSTOLIC) 10 MG tablet, TAKE 1 TABLET BY MOUTH DAILY, Disp: 100 tablet, Rfl: 3   NEEDLE, DISP, 25 G (B-D DISP NEEDLE 25GX1") 25G X 1" MISC, Inject 1000 mcg into muscle once a month., Disp: 50 each, Rfl: 0   nitroGLYCERIN (NITROSTAT) 0.4 MG SL tablet, DISSOLVE ONE TABLET UNDER THE TONGUE EVERY 5 MINUTES AS NEEDED FOR CHEST PAIN., Disp: 25 tablet, Rfl: 0   pantoprazole (PROTONIX) 40 MG tablet, Take 1 tablet (40 mg total) by mouth 2 (two) times daily., Disp: 90 tablet, Rfl: 3   Probiotic Product (PROBIOTIC GUMMIES PO), Take 1 capsule by mouth in the morning., Disp: , Rfl:    rOPINIRole (REQUIP)  2 MG tablet, TAKE 1 TABLET BY MOUTH AT  BEDTIME, Disp: 100 tablet, Rfl: 2   rosuvastatin (CRESTOR) 10 MG tablet, TAKE 1 TABLET BY MOUTH DAILY, Disp: 60 tablet, Rfl: 5   sucralfate (CARAFATE) 1 GM/10ML suspension, Take 10 mLs (1 g total) by mouth 2 (two) times daily., Disp: 420 mL, Rfl: 1   venlafaxine (EFFEXOR) 75 MG tablet, Take 1 tablet (75 mg total) by mouth daily., Disp: 90 tablet, Rfl: 1   oxyCODONE-acetaminophen (PERCOCET) 10-325 MG tablet, Take 1 tablet by mouth 2 (two) times daily as needed for pain., Disp: 60 tablet, Rfl: 0   oxyCODONE-acetaminophen (PERCOCET) 10-325 MG tablet, Take 1 tablet by mouth 2 (two) times daily as needed for pain., Disp: 60 tablet, Rfl: 0   oxyCODONE-acetaminophen (PERCOCET) 10-325 MG tablet, Take 1 tablet by mouth 2 (two) times daily as needed for pain., Disp: 60 tablet, Rfl: 0  Review of Systems:  Negative unless indicated in HPI.   Physical Exam: Vitals:   06/02/22 0924  BP: 130/70  Pulse: 60  Temp: 98.2 F (36.8 C)  TempSrc: Oral  SpO2: 97%  Weight: 171 lb 4.8 oz (77.7 kg)  Height: 5\' 8"  (1.727 m)     Body mass index is 26.05 kg/m.   Physical Exam Vitals reviewed.  Constitutional:      General: He is not in acute distress.    Appearance: Normal appearance. He is not ill-appearing, toxic-appearing or diaphoretic.  HENT:     Head: Normocephalic.     Right Ear: Tympanic membrane, ear canal and external ear normal. There is no impacted cerumen.     Left Ear: Tympanic membrane, ear canal and external ear normal. There is no impacted cerumen.     Nose: Nose normal.     Mouth/Throat:     Mouth: Mucous membranes are moist.     Pharynx: Oropharynx is clear. No oropharyngeal exudate or posterior oropharyngeal erythema.  Eyes:     General: No scleral icterus.       Right eye: No discharge.        Left eye: No discharge.     Conjunctiva/sclera: Conjunctivae normal.     Pupils: Pupils are equal, round, and reactive to light.  Neck:     Vascular: No carotid bruit.  Cardiovascular:     Rate and Rhythm: Normal rate and regular rhythm.     Pulses: Normal pulses.     Heart sounds: Normal heart sounds.  Pulmonary:     Effort: Pulmonary effort is normal. No respiratory distress.     Breath sounds: Normal breath sounds.  Abdominal:     General: Abdomen is flat. Bowel sounds are normal.     Palpations: Abdomen is soft.  Musculoskeletal:        General: Normal range of motion.     Cervical back: Normal range of motion.  Skin:    General: Skin is warm and dry.  Neurological:     General: No focal deficit present.     Mental Status: He is alert and oriented to person, place, and time. Mental status is at baseline.  Psychiatric:        Mood and Affect: Mood normal.        Behavior: Behavior normal.        Thought Content: Thought content normal.        Judgment: Judgment normal.    Subsequent Medicare wellness visit   1. Risk factors, based on past  M,S,F - Cardiac Risk Factors  include: advanced age (>8men, >62 women);diabetes mellitus   2.  Physical activities: Dietary issues  and exercise activities discussed:  Current Exercise Habits: Home exercise routine, Type of exercise: walking, Time (Minutes): 30, Frequency (Times/Week): 7, Weekly Exercise (Minutes/Week): 210, Exercise limited by: orthopedic condition(s);cardiac condition(s)   3.  Depression/mood:  Flowsheet Row Office Visit from 06/02/2022 in [facility] Total Score 21        4.  ADL's:    06/02/2022    9:22 AM 05/29/2022    8:59 PM  In your present state of health, do you have any difficulty performing the following activities:  Hearing? 1 0  Vision? 0 0  Difficulty concentrating or making decisions? 1 0  Walking or climbing stairs? 1 1  Dressing or bathing? 0 0  Doing errands, shopping? 0 0  Preparing Food and eating ? N N  Using the Toilet?  N  In the past six months, have you accidently leaked urine? Y N  Do you have problems with loss of bowel control? Y Y  Managing your Medications? Malvin Johns  Comment daughter   Managing your Finances? Alpha Gula  Comment daughter   Housekeeping or managing your Housekeeping? Y N  Comment has help      5.  Fall risk:     10/16/2021   11:28 AM 12/04/2021    9:40 AM 02/27/2022   10:45 AM 05/29/2022    8:59 PM 06/02/2022    9:53 AM  Fall Risk  Falls in the past year? 1 1 1 1 1   Was there an injury with Fall? 0 0 0 0   Fall Risk Category Calculator 2 1 2 1    Fall Risk Category (Retired) Moderate Low     (RETIRED) Patient Fall Risk Level  Low fall risk     Patient at Risk for Falls Due to  No Fall Risks     Fall risk Follow up  Falls evaluation completed   Falls evaluation completed     6.  Home safety: No problems identified   7.  Height weight, and visual acuity: height and weight as above, vision/hearing: Vision Screening   Right eye Left eye Both eyes  Without correction 20/40 20/40 20/40   With correction        8.  Counseling: Counseling given: Not Answered    9. Lab orders based on risk factors: Laboratory  update will be reviewed   10. Cognitive assessment:        06/02/2022    9:27 AM  6CIT Screen  What Year? 0 points  What month? 0 points  What time? 0 points  Count back from 20 0 points  Months in reverse 0 points  Repeat phrase 0 points  Total Score 0 points     11. Screening: Patient provided with a written and personalized 5-10 year screening schedule in the AVS. Health Maintenance  Topic Date Due   Eye exam for diabetics  Never done   Zoster (Shingles) Vaccine (1 of 2) Never done   COVID-19 Vaccine (6 - 2023-24 season) 01/14/2022   Yearly kidney health urinalysis for diabetes  06/03/2022   Flu Shot  08/20/2022   Hemoglobin A1C  11/22/2022   Colon Cancer Screening  12/30/2022   Complete foot exam   03/04/2023   Yearly kidney function blood test for diabetes  05/22/2023   Medicare Annual Wellness Visit  06/02/2023   DTaP/Tdap/Td vaccine (2 - Td or Tdap)  11/25/2026   Pneumonia Vaccine  Completed   HPV Vaccine  Aged Out    12. Provider List Update: Patient Care Team    Relationship Specialty Notifications Start End  Philip Aspen, Limmie Patricia, MD PCP - General Internal Medicine  12/24/17   Lennette Bihari, MD PCP - Cardiology Cardiology Admissions 06/09/17   Verner Chol, [facility] (Inactive) Pharmacist Pharmacist  11/11/20    Comment: Phone: [phone]     13. Advance Directives: Does Patient Have a Medical Advance Directive?: No Would patient like information on creating a medical advance directive?: No - Patient declined  14. Opioids: Patient is on chronic opioids. He has a signed pain contract with me. Has not displayed any signs of an opioid-use disorder.   15.   Goals      Protect My Health     Timeframe:  Long-Range Goal Priority:  Medium Start Date:                             Expected End Date:                       Follow Up Date 06/02/2023    - schedule and keep appointment for annual check-up    Why is this important?   Screening tests can  find diseases early when they are easier to treat.  Your doctor or nurse will talk with you about which tests are important for you.  Getting shots for common diseases like the flu and shingles will help prevent them.     Notes:          I have personally reviewed and noted the following in the patient's chart:   Medical and social history Use of alcohol, tobacco or illicit drugs  Current medications and supplements Functional ability and status Nutritional status Physical activity Advanced directives List of other physicians Hospitalizations, surgeries, and ER visits in previous 12 months Vitals Screenings to include cognitive, depression, and falls Referrals and appointments  In addition, I have reviewed and discussed with patient certain preventive protocols, quality metrics, and best practice recommendations. A written personalized care plan for preventive services as well as general preventive health recommendations were provided to patient.   Impression and Plan:  Medicare annual wellness visit, subsequent  B12 deficiency -     Vitamin B12; Future  Other specified hypothyroidism  Essential hypertension  Diabetes mellitus type 2, noninsulin dependent (HCC) -     CBC with Differential/Platelet; Future -     Comprehensive metabolic panel; Future -     Hemoglobin A1c; Future -     Microalbumin / creatinine urine ratio; Future  Abnormal LFTs  Hyperlipidemia, unspecified hyperlipidemia type -     Lipid panel; Future  Chronic pain syndrome -     oxyCODONE-Acetaminophen; Take 1 tablet by mouth 2 (two) times daily as needed for pain.  Dispense: 60 tablet; Refill: 0 -     oxyCODONE-Acetaminophen; Take 1 tablet by mouth 2 (two) times daily as needed for pain.  Dispense: 60 tablet; Refill: 0 -     oxyCODONE-Acetaminophen; Take 1 tablet by mouth 2 (two) times daily as needed for pain.  Dispense: 60 tablet; Refill: 0  Prostate cancer screening -     PSA;  Future  Other orders -     Clobetasol Propionate; Apply 1 Application topically 2 (two) times daily.  Dispense: 50 mL; Refill: 0   -Recommend  routine eye and dental care. -Healthy lifestyle discussed in detail. -Labs to be updated today. -Prostate cancer screening: PSA today Health Maintenance  Topic Date Due   Eye exam for diabetics  Never done   Zoster (Shingles) Vaccine (1 of 2) Never done   COVID-19 Vaccine (6 - 2023-24 season) 01/14/2022   Yearly kidney health urinalysis for diabetes  06/03/2022   Flu Shot  08/20/2022   Hemoglobin A1C  11/22/2022   Colon Cancer Screening  12/30/2022   Complete foot exam   03/04/2023   Yearly kidney function blood test for diabetes  05/22/2023   Medicare Annual Wellness Visit  06/02/2023   DTaP/Tdap/Td vaccine (2 - Td or Tdap) 11/25/2026   Pneumonia Vaccine  Completed   HPV Vaccine  Aged Out    -PDMP reviewed, no red flags, overdose risk score is 100.  Refill oxycodone to take 1 tablet twice daily for total of 60 tablets a month x 3 months.     Chaya Jan, MD [facility] at [facility]

## 2022-06-03 ENCOUNTER — Inpatient Hospital Stay (HOSPITAL_COMMUNITY): Payer: Medicare Other | Admitting: Certified Registered"

## 2022-06-03 ENCOUNTER — Other Ambulatory Visit: Payer: Self-pay

## 2022-06-03 ENCOUNTER — Inpatient Hospital Stay (HOSPITAL_COMMUNITY): Payer: Medicare Other | Admitting: Physician Assistant

## 2022-06-03 ENCOUNTER — Encounter (HOSPITAL_COMMUNITY)
Admission: RE | Disposition: A | Discharge status: Home / Self Care | Payer: Self-pay | Source: Home / Self Care | Attending: Urology

## 2022-06-03 ENCOUNTER — Other Ambulatory Visit (HOSPITAL_COMMUNITY): Payer: Self-pay

## 2022-06-03 ENCOUNTER — Encounter (HOSPITAL_COMMUNITY): Payer: Self-pay | Admitting: Urology

## 2022-06-03 ENCOUNTER — Inpatient Hospital Stay (HOSPITAL_COMMUNITY)
Admission: RE | Admit: 2022-06-03 | Discharge: 2022-06-05 | DRG: 657 | Disposition: A | Discharge status: Home / Self Care | Payer: Medicare Other | Attending: Urology | Admitting: Urology

## 2022-06-03 DIAGNOSIS — N1831 Chronic kidney disease, stage 3a: Secondary | ICD-10-CM | POA: Diagnosis not present

## 2022-06-03 DIAGNOSIS — I252 Old myocardial infarction: Secondary | ICD-10-CM

## 2022-06-03 DIAGNOSIS — K219 Gastro-esophageal reflux disease without esophagitis: Secondary | ICD-10-CM | POA: Diagnosis present

## 2022-06-03 DIAGNOSIS — R319 Hematuria, unspecified: Secondary | ICD-10-CM | POA: Diagnosis not present

## 2022-06-03 DIAGNOSIS — Z8551 Personal history of malignant neoplasm of bladder: Secondary | ICD-10-CM

## 2022-06-03 DIAGNOSIS — J449 Chronic obstructive pulmonary disease, unspecified: Secondary | ICD-10-CM | POA: Diagnosis present

## 2022-06-03 DIAGNOSIS — Z955 Presence of coronary angioplasty implant and graft: Secondary | ICD-10-CM

## 2022-06-03 DIAGNOSIS — C651 Malignant neoplasm of right renal pelvis: Principal | ICD-10-CM | POA: Diagnosis present

## 2022-06-03 DIAGNOSIS — Z808 Family history of malignant neoplasm of other organs or systems: Secondary | ICD-10-CM

## 2022-06-03 DIAGNOSIS — T83410A Breakdown (mechanical) of penile (implanted) prosthesis, initial encounter: Secondary | ICD-10-CM | POA: Diagnosis present

## 2022-06-03 DIAGNOSIS — Z9049 Acquired absence of other specified parts of digestive tract: Secondary | ICD-10-CM

## 2022-06-03 DIAGNOSIS — I509 Heart failure, unspecified: Secondary | ICD-10-CM | POA: Diagnosis not present

## 2022-06-03 DIAGNOSIS — Z823 Family history of stroke: Secondary | ICD-10-CM

## 2022-06-03 DIAGNOSIS — N529 Male erectile dysfunction, unspecified: Secondary | ICD-10-CM | POA: Diagnosis present

## 2022-06-03 DIAGNOSIS — G2581 Restless legs syndrome: Secondary | ICD-10-CM | POA: Diagnosis not present

## 2022-06-03 DIAGNOSIS — C641 Malignant neoplasm of right kidney, except renal pelvis: Secondary | ICD-10-CM | POA: Diagnosis not present

## 2022-06-03 DIAGNOSIS — H5713 Ocular pain, bilateral: Secondary | ICD-10-CM | POA: Diagnosis not present

## 2022-06-03 DIAGNOSIS — E1122 Type 2 diabetes mellitus with diabetic chronic kidney disease: Secondary | ICD-10-CM | POA: Diagnosis present

## 2022-06-03 DIAGNOSIS — Z951 Presence of aortocoronary bypass graft: Secondary | ICD-10-CM

## 2022-06-03 DIAGNOSIS — I129 Hypertensive chronic kidney disease with stage 1 through stage 4 chronic kidney disease, or unspecified chronic kidney disease: Secondary | ICD-10-CM | POA: Diagnosis present

## 2022-06-03 DIAGNOSIS — G894 Chronic pain syndrome: Secondary | ICD-10-CM

## 2022-06-03 DIAGNOSIS — Z87891 Personal history of nicotine dependence: Secondary | ICD-10-CM

## 2022-06-03 DIAGNOSIS — Z8249 Family history of ischemic heart disease and other diseases of the circulatory system: Secondary | ICD-10-CM | POA: Diagnosis not present

## 2022-06-03 DIAGNOSIS — E785 Hyperlipidemia, unspecified: Secondary | ICD-10-CM | POA: Diagnosis present

## 2022-06-03 DIAGNOSIS — I2510 Atherosclerotic heart disease of native coronary artery without angina pectoris: Secondary | ICD-10-CM

## 2022-06-03 DIAGNOSIS — N35912 Unspecified bulbous urethral stricture, male: Secondary | ICD-10-CM | POA: Diagnosis not present

## 2022-06-03 DIAGNOSIS — I110 Hypertensive heart disease with heart failure: Secondary | ICD-10-CM | POA: Diagnosis not present

## 2022-06-03 DIAGNOSIS — Z8719 Personal history of other diseases of the digestive system: Secondary | ICD-10-CM

## 2022-06-03 DIAGNOSIS — R310 Gross hematuria: Secondary | ICD-10-CM | POA: Diagnosis not present

## 2022-06-03 DIAGNOSIS — Z79899 Other long term (current) drug therapy: Secondary | ICD-10-CM

## 2022-06-03 DIAGNOSIS — C679 Malignant neoplasm of bladder, unspecified: Secondary | ICD-10-CM | POA: Diagnosis not present

## 2022-06-03 DIAGNOSIS — E119 Type 2 diabetes mellitus without complications: Secondary | ICD-10-CM

## 2022-06-03 DIAGNOSIS — D630 Anemia in neoplastic disease: Secondary | ICD-10-CM | POA: Diagnosis not present

## 2022-06-03 HISTORY — PX: ROBOT ASSITED LAPAROSCOPIC NEPHROURETERECTOMY: SHX6077

## 2022-06-03 LAB — BASIC METABOLIC PANEL
Anion gap: 10 (ref 5–15)
BUN: 26 mg/dL — ABNORMAL HIGH (ref 8–23)
CO2: 24 mmol/L (ref 22–32)
Calcium: 8.5 mg/dL — ABNORMAL LOW (ref 8.9–10.3)
Chloride: 105 mmol/L (ref 98–111)
Creatinine, Ser: 1.83 mg/dL — ABNORMAL HIGH (ref 0.61–1.24)
GFR, Estimated: 37 mL/min — ABNORMAL LOW (ref >60)
Glucose, Bld: 115 mg/dL — ABNORMAL HIGH (ref 70–99)
Potassium: 4.5 mmol/L (ref 3.5–5.1)
Sodium: 139 mmol/L (ref 135–145)

## 2022-06-03 LAB — HEMOGLOBIN AND HEMATOCRIT, BLOOD
HCT: 38 % — ABNORMAL LOW (ref 39.0–52.0)
Hemoglobin: 12.2 g/dL — ABNORMAL LOW (ref 13.0–17.0)

## 2022-06-03 LAB — GLUCOSE, CAPILLARY
Glucose-Capillary: 94 mg/dL (ref 70–99)
Glucose-Capillary: 89 mg/dL (ref 70–99)

## 2022-06-03 SURGERY — NEPHROURETERECTOMY, ROBOT-ASSISTED, LAPAROSCOPIC
Anesthesia: General | Laterality: Right

## 2022-06-03 MED ORDER — CEFAZOLIN SODIUM-DEXTROSE 1-4 GM/50ML-% IV SOLN
1.0000 g | Freq: Three times a day (TID) | INTRAVENOUS | Status: AC
  Administered 2022-06-03 – 2022-06-04 (×2): 1 g via INTRAVENOUS
  Filled 2022-06-03 (×2): qty 50

## 2022-06-03 MED ORDER — DOCUSATE SODIUM 100 MG PO CAPS
100.0000 mg | ORAL_CAPSULE | Freq: Two times a day (BID) | ORAL | Status: DC
  Administered 2022-06-03 – 2022-06-05 (×4): 100 mg via ORAL
  Filled 2022-06-03 (×4): qty 1

## 2022-06-03 MED ORDER — HYDROMORPHONE HCL 1 MG/ML IJ SOLN
0.2500 mg | INTRAMUSCULAR | Status: DC | PRN
  Administered 2022-06-03: 0.5 mg via INTRAVENOUS

## 2022-06-03 MED ORDER — BUPIVACAINE LIPOSOME 1.3 % IJ SUSP
INTRAMUSCULAR | Status: DC | PRN
  Administered 2022-06-03: 20 mL

## 2022-06-03 MED ORDER — ROSUVASTATIN CALCIUM 10 MG PO TABS
10.0000 mg | ORAL_TABLET | Freq: Every day | ORAL | Status: DC
  Administered 2022-06-04 – 2022-06-05 (×2): 10 mg via ORAL
  Filled 2022-06-03 (×2): qty 1

## 2022-06-03 MED ORDER — ROPINIROLE HCL 1 MG PO TABS
2.0000 mg | ORAL_TABLET | Freq: Every day | ORAL | Status: DC
  Administered 2022-06-03 – 2022-06-04 (×2): 2 mg via ORAL
  Filled 2022-06-03 (×2): qty 2

## 2022-06-03 MED ORDER — LACTATED RINGERS IR SOLN
Status: DC | PRN
  Administered 2022-06-03: 1000 mL

## 2022-06-03 MED ORDER — LACTATED RINGERS IV SOLN: INTRAVENOUS | Status: DC | PRN

## 2022-06-03 MED ORDER — VENLAFAXINE HCL 75 MG PO TABS
75.0000 mg | ORAL_TABLET | Freq: Every day | ORAL | Status: DC
  Administered 2022-06-04 – 2022-06-05 (×2): 75 mg via ORAL
  Filled 2022-06-03 (×2): qty 1

## 2022-06-03 MED ORDER — OXYCODONE HCL 5 MG PO TABS: 5.0000 mg | ORAL_TABLET | Freq: Once | ORAL | Status: DC | PRN

## 2022-06-03 MED ORDER — BUPIVACAINE LIPOSOME 1.3 % IJ SUSP
INTRAMUSCULAR | Status: AC
  Filled 2022-06-03: qty 20

## 2022-06-03 MED ORDER — DOCUSATE SODIUM 100 MG PO CAPS: 100.0000 mg | ORAL_CAPSULE | Freq: Two times a day (BID) | ORAL | Status: DC

## 2022-06-03 MED ORDER — ALBUMIN HUMAN 5 % IV SOLN: INTRAVENOUS | Status: DC | PRN

## 2022-06-03 MED ORDER — KETAMINE HCL 10 MG/ML IJ SOLN
INTRAMUSCULAR | Status: DC | PRN
  Administered 2022-06-03: 20 mg via INTRAVENOUS

## 2022-06-03 MED ORDER — GLYCOPYRROLATE 0.2 MG/ML IJ SOLN
INTRAMUSCULAR | Status: DC | PRN
  Administered 2022-06-03: .1 mg via INTRAVENOUS

## 2022-06-03 MED ORDER — KETAMINE HCL 50 MG/5ML IJ SOSY
PREFILLED_SYRINGE | INTRAMUSCULAR | Status: AC
  Filled 2022-06-03: qty 5

## 2022-06-03 MED ORDER — AMLODIPINE BESYLATE 5 MG PO TABS
5.0000 mg | ORAL_TABLET | Freq: Every day | ORAL | Status: DC
  Administered 2022-06-04 – 2022-06-05 (×2): 5 mg via ORAL
  Filled 2022-06-03 (×2): qty 1

## 2022-06-03 MED ORDER — ONDANSETRON HCL 4 MG/2ML IJ SOLN: 4.0000 mg | INTRAMUSCULAR | Status: DC | PRN

## 2022-06-03 MED ORDER — OXYCODONE HCL 5 MG PO TABS
5.0000 mg | ORAL_TABLET | ORAL | Status: DC | PRN
  Administered 2022-06-04 – 2022-06-05 (×6): 5 mg via ORAL
  Filled 2022-06-03 (×6): qty 1

## 2022-06-03 MED ORDER — CHLORHEXIDINE GLUCONATE 0.12 % MT SOLN
15.0000 mL | Freq: Once | OROMUCOSAL | Status: AC
  Administered 2022-06-03: 15 mL via OROMUCOSAL

## 2022-06-03 MED ORDER — SODIUM CHLORIDE (PF) 0.9 % IJ SOLN
INTRAMUSCULAR | Status: AC
  Filled 2022-06-03: qty 20

## 2022-06-03 MED ORDER — ROCURONIUM BROMIDE 10 MG/ML (PF) SYRINGE
PREFILLED_SYRINGE | INTRAVENOUS | Status: AC
  Filled 2022-06-03: qty 10

## 2022-06-03 MED ORDER — PHENYLEPHRINE HCL-NACL 20-0.9 MG/250ML-% IV SOLN
INTRAVENOUS | Status: DC | PRN
  Administered 2022-06-03: 20 ug/min via INTRAVENOUS

## 2022-06-03 MED ORDER — CEFAZOLIN SODIUM-DEXTROSE 2-4 GM/100ML-% IV SOLN
2.0000 g | INTRAVENOUS | Status: AC
  Administered 2022-06-03: 2 g via INTRAVENOUS
  Filled 2022-06-03: qty 100

## 2022-06-03 MED ORDER — MAGNESIUM CITRATE PO SOLN: 1.0000 | Freq: Once | ORAL | Status: DC

## 2022-06-03 MED ORDER — DIPHENHYDRAMINE HCL 12.5 MG/5ML PO ELIX: 12.5000 mg | ORAL_SOLUTION | Freq: Four times a day (QID) | ORAL | Status: DC | PRN

## 2022-06-03 MED ORDER — EPHEDRINE SULFATE-NACL 50-0.9 MG/10ML-% IV SOSY
PREFILLED_SYRINGE | INTRAVENOUS | Status: DC | PRN
  Administered 2022-06-03 (×3): 5 mg via INTRAVENOUS

## 2022-06-03 MED ORDER — SODIUM CHLORIDE (PF) 0.9 % IJ SOLN
INTRAMUSCULAR | Status: DC | PRN
  Administered 2022-06-03: 20 mL

## 2022-06-03 MED ORDER — SODIUM CHLORIDE 0.9 % IR SOLN
Status: DC | PRN
  Administered 2022-06-03: 1000 mL via INTRAVESICAL

## 2022-06-03 MED ORDER — LIDOCAINE HCL (PF) 2 % IJ SOLN
INTRAMUSCULAR | Status: AC
  Filled 2022-06-03: qty 15

## 2022-06-03 MED ORDER — FENTANYL CITRATE (PF) 100 MCG/2ML IJ SOLN
INTRAMUSCULAR | Status: DC | PRN
  Administered 2022-06-03 (×3): 50 ug via INTRAVENOUS

## 2022-06-03 MED ORDER — DEXTROSE-SODIUM CHLORIDE 5-0.45 % IV SOLN: INTRAVENOUS | Status: DC

## 2022-06-03 MED ORDER — OXYCODONE-ACETAMINOPHEN 10-325 MG PO TABS
1.0000 | ORAL_TABLET | Freq: Four times a day (QID) | ORAL | 0 refills | Status: DC | PRN
  Filled 2022-06-03: qty 10, 3d supply, fill #0

## 2022-06-03 MED ORDER — ONDANSETRON HCL 4 MG/2ML IJ SOLN
INTRAMUSCULAR | Status: AC
  Filled 2022-06-03: qty 2

## 2022-06-03 MED ORDER — SUGAMMADEX SODIUM 200 MG/2ML IV SOLN
INTRAVENOUS | Status: DC | PRN
  Administered 2022-06-03: 200 mg via INTRAVENOUS

## 2022-06-03 MED ORDER — PROPOFOL 10 MG/ML IV BOLUS
INTRAVENOUS | Status: DC | PRN
  Administered 2022-06-03: 130 mg via INTRAVENOUS

## 2022-06-03 MED ORDER — FENTANYL CITRATE (PF) 100 MCG/2ML IJ SOLN
INTRAMUSCULAR | Status: AC
  Filled 2022-06-03: qty 2

## 2022-06-03 MED ORDER — DIPHENHYDRAMINE HCL 50 MG/ML IJ SOLN: 12.5000 mg | Freq: Four times a day (QID) | INTRAMUSCULAR | Status: DC | PRN

## 2022-06-03 MED ORDER — LIDOCAINE 2% (20 MG/ML) 5 ML SYRINGE
INTRAMUSCULAR | Status: DC | PRN
  Administered 2022-06-03: 1 mg/kg/h via INTRAVENOUS
  Administered 2022-06-03: 100 mg via INTRAVENOUS

## 2022-06-03 MED ORDER — STERILE WATER FOR IRRIGATION IR SOLN
Status: DC | PRN
  Administered 2022-06-03: 2000 mL

## 2022-06-03 MED ORDER — ISOSORBIDE MONONITRATE ER 60 MG PO TB24
60.0000 mg | ORAL_TABLET | Freq: Every day | ORAL | Status: DC
  Administered 2022-06-04 – 2022-06-05 (×2): 60 mg via ORAL
  Filled 2022-06-03 (×2): qty 1

## 2022-06-03 MED ORDER — OXYCODONE HCL 5 MG/5ML PO SOLN: 5.0000 mg | Freq: Once | ORAL | Status: DC | PRN

## 2022-06-03 MED ORDER — HYDROMORPHONE HCL 1 MG/ML IJ SOLN
INTRAMUSCULAR | Status: AC
  Administered 2022-06-03: 0.5 mg via INTRAVENOUS
  Filled 2022-06-03: qty 1

## 2022-06-03 MED ORDER — PROPOFOL 10 MG/ML IV BOLUS
INTRAVENOUS | Status: AC
  Filled 2022-06-03: qty 20

## 2022-06-03 MED ORDER — ONDANSETRON HCL 4 MG/2ML IJ SOLN: 4.0000 mg | Freq: Once | INTRAMUSCULAR | Status: DC | PRN

## 2022-06-03 MED ORDER — ROCURONIUM BROMIDE 100 MG/10ML IV SOLN
INTRAVENOUS | Status: DC | PRN
  Administered 2022-06-03: 20 mg via INTRAVENOUS
  Administered 2022-06-03: 70 mg via INTRAVENOUS

## 2022-06-03 MED ORDER — ORAL CARE MOUTH RINSE: 15.0000 mL | Freq: Once | OROMUCOSAL | Status: AC

## 2022-06-03 MED ORDER — MORPHINE SULFATE (PF) 2 MG/ML IV SOLN
2.0000 mg | INTRAVENOUS | Status: DC | PRN
  Administered 2022-06-04 – 2022-06-05 (×2): 2 mg via INTRAVENOUS
  Filled 2022-06-03 (×4): qty 1

## 2022-06-03 MED ORDER — 0.9 % SODIUM CHLORIDE (POUR BTL) OPTIME
TOPICAL | Status: DC | PRN
  Administered 2022-06-03: 1000 mL

## 2022-06-03 MED ORDER — ACETAMINOPHEN 500 MG PO TABS
1000.0000 mg | ORAL_TABLET | Freq: Four times a day (QID) | ORAL | Status: AC
  Administered 2022-06-03 – 2022-06-04 (×4): 1000 mg via ORAL
  Filled 2022-06-03 (×4): qty 2

## 2022-06-03 MED ORDER — PANTOPRAZOLE SODIUM 40 MG PO TBEC
40.0000 mg | DELAYED_RELEASE_TABLET | Freq: Two times a day (BID) | ORAL | Status: DC
  Administered 2022-06-03 – 2022-06-05 (×4): 40 mg via ORAL
  Filled 2022-06-03 (×4): qty 1

## 2022-06-03 MED ORDER — LACTATED RINGERS IV SOLN: INTRAVENOUS | Status: DC

## 2022-06-03 MED ORDER — PHENYLEPHRINE 80 MCG/ML (10ML) SYRINGE FOR IV PUSH (FOR BLOOD PRESSURE SUPPORT)
PREFILLED_SYRINGE | INTRAVENOUS | Status: DC | PRN
  Administered 2022-06-03 (×2): 80 ug via INTRAVENOUS

## 2022-06-03 SURGICAL SUPPLY — 76 items
ADH SKN CLS APL DERMABOND .7 (GAUZE/BANDAGES/DRESSINGS) ×2
APL PRP STRL LF DISP 70% ISPRP (MISCELLANEOUS) ×2
APL SWBSTK 6 STRL LF DISP (MISCELLANEOUS) ×5
APPLICATOR COTTON TIP 6 STRL (MISCELLANEOUS) IMPLANT
APPLICATOR COTTON TIP 6IN STRL (MISCELLANEOUS) ×5
BAG COUNTER SPONGE SURGICOUNT (BAG) IMPLANT
BAG LAPAROSCOPIC 12 15 PORT 16 (BASKET) ×1 IMPLANT
BAG RETRIEVAL 12/15 (BASKET) ×1
BAG SPNG CNTER NS LX DISP (BAG)
CATH FOLEY 2W COUNCIL 5CC 16FR (CATHETERS) IMPLANT
CATH FOLEY 2WAY SLVR  5CC 12FR (CATHETERS)
CATH FOLEY 2WAY SLVR 5CC 12FR (CATHETERS) IMPLANT
CHLORAPREP W/TINT 26 (MISCELLANEOUS) ×1 IMPLANT
CLIP LIGATING HEM O LOK PURPLE (MISCELLANEOUS) ×1 IMPLANT
CLIP LIGATING HEMO LOK XL GOLD (MISCELLANEOUS) ×1 IMPLANT
CLIP LIGATING HEMO O LOK GREEN (MISCELLANEOUS) ×1 IMPLANT
COVER SURGICAL LIGHT HANDLE (MISCELLANEOUS) ×1 IMPLANT
COVER TIP SHEARS 8 DVNC (MISCELLANEOUS) ×1 IMPLANT
CUTTER ECHEON FLEX ENDO 45 340 (ENDOMECHANICALS) IMPLANT
DERMABOND ADVANCED .7 DNX12 (GAUZE/BANDAGES/DRESSINGS) ×2 IMPLANT
DRAIN CHANNEL 15F RND FF 3/16 (WOUND CARE) IMPLANT
DRAPE ARM DVNC X/XI (DISPOSABLE) ×4 IMPLANT
DRAPE COLUMN DVNC XI (DISPOSABLE) ×1 IMPLANT
DRAPE INCISE IOBAN 66X45 STRL (DRAPES) ×1 IMPLANT
DRAPE SHEET LG 3/4 BI-LAMINATE (DRAPES) ×1 IMPLANT
DRIVER NDL LRG 8 DVNC XI (INSTRUMENTS) ×2 IMPLANT
DRIVER NDLE LRG 8 DVNC XI (INSTRUMENTS) IMPLANT
ELECT PENCIL ROCKER SW 15FT (MISCELLANEOUS) ×1 IMPLANT
ELECT REM PT RETURN 15FT ADLT (MISCELLANEOUS) ×1 IMPLANT
EVACUATOR SILICONE 100CC (DRAIN) IMPLANT
FORCEPS BPLR FENES DVNC XI (FORCEP) ×1 IMPLANT
FORCEPS PROGRASP DVNC XI (FORCEP) ×1 IMPLANT
GLOVE BIO SURGEON STRL SZ 6.5 (GLOVE) ×1 IMPLANT
GLOVE SURG LX STRL 7.5 STRW (GLOVE) ×2 IMPLANT
GOWN SRG XL LVL 4 BRTHBL STRL (GOWNS) ×1 IMPLANT
GOWN STRL NON-REIN XL LVL4 (GOWNS) ×1
GOWN STRL REUS W/ TWL XL LVL3 (GOWN DISPOSABLE) ×2 IMPLANT
GOWN STRL REUS W/TWL XL LVL3 (GOWN DISPOSABLE) ×4
GUIDEWIRE STR DUAL SENSOR (WIRE) IMPLANT
IRRIG SUCT STRYKERFLOW 2 WTIP (MISCELLANEOUS) ×1
IRRIGATION SUCT STRKRFLW 2 WTP (MISCELLANEOUS) ×1 IMPLANT
KIT BASIN OR (CUSTOM PROCEDURE TRAY) ×1 IMPLANT
KIT TURNOVER KIT A (KITS) IMPLANT
LOOP VESSEL MAXI BLUE (MISCELLANEOUS) ×1 IMPLANT
MARKER SKIN DUAL TIP RULER LAB (MISCELLANEOUS) ×1 IMPLANT
NDL INSUFFLATION 14GA 120MM (NEEDLE) ×1 IMPLANT
NEEDLE INSUFFLATION 14GA 120MM (NEEDLE) ×1 IMPLANT
NS IRRIG 1000ML POUR BTL (IV SOLUTION) ×1 IMPLANT
PORT ACCESS TROCAR AIRSEAL 12 (TROCAR) ×1 IMPLANT
PROTECTOR NERVE ULNAR (MISCELLANEOUS) ×2 IMPLANT
RELOAD STAPLE 45 2.6 WHT THIN (STAPLE) IMPLANT
SCISSORS MNPLR CVD DVNC XI (INSTRUMENTS) ×1 IMPLANT
SEAL UNIV 5-12 XI (MISCELLANEOUS) ×3 IMPLANT
SET CYSTO W/LG BORE CLAMP LF (SET/KITS/TRAYS/PACK) IMPLANT
SET TRI-LUMEN FLTR TB AIRSEAL (TUBING) ×1 IMPLANT
SLEEVE ADV FIXATION 12X100MM (TROCAR) ×1 IMPLANT
SOL ELECTROSURG ANTI STICK (MISCELLANEOUS) ×1
SOLUTION ELECTROSURG ANTI STCK (MISCELLANEOUS) ×1 IMPLANT
SPIKE FLUID TRANSFER (MISCELLANEOUS) ×1 IMPLANT
STAPLE RELOAD 45 WHT (STAPLE) ×4 IMPLANT
STAPLE RELOAD 45MM WHITE (STAPLE) ×4
SUT ETHILON 3 0 PS 1 (SUTURE) IMPLANT
SUT MNCRL AB 4-0 PS2 18 (SUTURE) ×2 IMPLANT
SUT PDS AB 1 CT1 27 (SUTURE) ×3 IMPLANT
SUT VIC AB 2-0 SH 27 (SUTURE) ×1
SUT VIC AB 2-0 SH 27X BRD (SUTURE) ×1 IMPLANT
SUT VLOC BARB 180 ABS3/0GR12 (SUTURE) ×1
SUTURE VLOC BRB 180 ABS3/0GR12 (SUTURE) ×1 IMPLANT
TOWEL OR 17X26 10 PK STRL BLUE (TOWEL DISPOSABLE) ×1 IMPLANT
TOWEL OR NON WOVEN STRL DISP B (DISPOSABLE) ×1 IMPLANT
TRAY FOLEY MTR SLVR 16FR STAT (SET/KITS/TRAYS/PACK) ×1 IMPLANT
TRAY LAPAROSCOPIC (CUSTOM PROCEDURE TRAY) ×1 IMPLANT
TROCAR ADV FIXATION 12X100MM (TROCAR) ×1 IMPLANT
TROCAR Z THREAD OPTICAL 12X100 (TROCAR) IMPLANT
TROCAR Z-THREAD OPTICAL 5X100M (TROCAR) IMPLANT
WATER STERILE IRR 1000ML POUR (IV SOLUTION) ×1 IMPLANT

## 2022-06-03 NOTE — Anesthesia Postprocedure Evaluation (Signed)
Anesthesia Post Note  Patient: [name]  Procedure(s) Performed: XI ROBOT ASSITED LAPAROSCOPIC NEPHROURETERECTOMY, FLEXIBLE CYSTOSCOPY (Right)     Patient location during evaluation: PACU Anesthesia Type: General Level of consciousness: awake and alert Pain management: pain level controlled Vital Signs Assessment: post-procedure vital signs reviewed and stable Respiratory status: spontaneous breathing, nonlabored ventilation, respiratory function stable and patient connected to nasal cannula oxygen Cardiovascular status: blood pressure returned to baseline and stable Postop Assessment: no apparent nausea or vomiting Anesthetic complications: no  No notable events documented.  Last Vitals:  Vitals:   06/03/22 1645 06/03/22 1841  BP: (!) 121/56 (!) 116/55  Pulse: (!) 57 (!) 53  Resp: 15 18  Temp:  36.6 C  SpO2: 100% 100%    Last Pain:  Vitals:   06/03/22 1841  TempSrc: Axillary  PainSc:                  Trevor Iha

## 2022-06-03 NOTE — Transfer of Care (Signed)
Immediate Anesthesia Transfer of Care Note  Patient: ESSA HANDSHOE  Procedure(s) Performed: XI ROBOT ASSITED LAPAROSCOPIC NEPHROURETERECTOMY (Right)  Patient Location: PACU  Anesthesia Type:General  Level of Consciousness: sedated  Airway & Oxygen Therapy: Patient Spontanous Breathing and Patient connected to face mask oxygen  Post-op Assessment: Report given to RN and Post -op Vital signs reviewed and stable  Post vital signs: Reviewed and stable  Last Vitals:  Vitals Value Taken Time  BP    Temp    Pulse 54 06/03/22 1521  Resp 18 06/03/22 1521  SpO2 100 % 06/03/22 1521  Vitals shown include unvalidated device data.  Last Pain:  Vitals:   06/03/22 1037  TempSrc:   PainSc: 7       Patients Stated Pain Goal: 4 (06/03/22 1037)  Complications: No notable events documented.

## 2022-06-03 NOTE — Brief Op Note (Signed)
06/03/2022  3:10 PM  PATIENT:  [name]  82 y.o. male  PRE-OPERATIVE DIAGNOSIS:  RIGHT RENAL PELVIS CANCER  POST-OPERATIVE DIAGNOSIS:  RIGHT RENAL PELVIS CANCER  PROCEDURE:  Procedure(s) with comments: XI ROBOT ASSITED LAPAROSCOPIC NEPHROURETERECTOMY (Right) - 3 HRS  SURGEON:  Surgeon(s) and Role:    * Coco Sharpnack, Delbert Phenix., MD - Primary  PHYSICIAN ASSISTANT:   ASSISTANTS: Harrie Foreman PA   ANESTHESIA:   local and general  EBL:  50mL   BLOOD ADMINISTERED:none  DRAINS:  1 - JP to bulb; 2 - Foley to gravity    LOCAL MEDICATIONS USED:  MARCAINE     SPECIMEN:  Source of Specimen:  Rt kidney + ureter + bladder Cuff  DISPOSITION OF SPECIMEN:  PATHOLOGY  COUNTS:  YES  TOURNIQUET:  * No tourniquets in log *  DICTATION: .Other Dictation: Dictation Number [id number]  PLAN OF CARE: Admit to inpatient   PATIENT DISPOSITION:  PACU - hemodynamically stable.   Delay start of Pharmacological VTE agent (>24hrs) due to surgical blood loss or risk of bleeding: yes

## 2022-06-03 NOTE — H&P (Signed)
[name] is an 82 y.o. male.    Chief Complaint: Pre-Op RIGHT Nephro-Ureterectomy  HPI:   1 - Recurrent Bladder Cancer - TaG3 since 1990s s/p BCG induction x 2 most recetnly 2009.   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 2 artery (upper very early brach behind IVC ==> 3 arteries by IVC edge) / 1 vein right renovascular anatomy.   3 - Stage 3a Renal Insuficiency - GFR upper 40s at baselien. NO hydro on imaging x many c/w medical renal disease   4 - Severe Erectile Dysfunction - s/p penile prosthesis (now non functional) years ago by [name]. Resevoir in left space of retzius via inguinal canal   5 - Bulbar Urethral Stricture - requres PRN dilastion of bulbar stricture incluidng OR baloon dilation 03/2022.   PMH sig for CAD/CABG/Plavix (follows Jacob Limbo MD, not limiting day to day). 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 His daugher [name] lives near and is very involved. He is independent in most all ADL's. His PCP is Jacob Gentles MD.    Today " [name] " is seen to proceed with RIGHT robotic nephro-ureterectomy. Holding plavix as directed. Cards clearance on file, favorable recent chemical stress test. Hgb 13, cr 1.5.   Past Medical History:  Diagnosis Date   Allergy    Anxiety    B12 deficiency anemia    Blood transfusion without reported diagnosis    CAD (coronary artery disease)    Cancer (HCC)    bladder, Right renal cell also   Chronic kidney disease    Colon polyps    COPD (chronic obstructive pulmonary disease) (HCC)    Depression    Diabetes mellitus (HCC)    Dyspnea    Esophagus, Barrett's    GERD (gastroesophageal reflux disease)    Headache    History of bladder cancer    Bladder cancer "8 times"   History of hiatal hernia    Hyperlipidemia    Hypertension    Localized osteoarthrosis, lower leg    Myocardial infarction (HCC) 2021   Pneumonia    Restless leg  syndrome    Sleep apnea    does not wear cpap   Stenosis of esophagus     Past Surgical History:  Procedure Laterality Date   BILIARY BRUSHING  04/01/2018   Procedure: BILIARY BRUSHING;  Surgeon: Lemar Lofty., MD;  Location: [facility] ENDOSCOPY;  Service: Gastroenterology;;   BILIARY BRUSHING  09/12/2018   Procedure: BILIARY BRUSHING;  Surgeon: Lemar Lofty., MD;  Location: [facility] ENDOSCOPY;  Service: Gastroenterology;;   BILIARY BRUSHING  11/28/2018   Procedure: BILIARY BRUSHING;  Surgeon: Lemar Lofty., MD;  Location: [facility] ENDOSCOPY;  Service: Gastroenterology;;   BILIARY BRUSHING  03/11/2020   Procedure: BILIARY BRUSHING;  Surgeon: Lemar Lofty., MD;  Location: [facility] ENDOSCOPY;  Service: Gastroenterology;;   BILIARY DILATION  09/12/2018   Procedure: BILIARY DILATION;  Surgeon: Lemar Lofty., MD;  Location: [facility] ENDOSCOPY;  Service: Gastroenterology;;   BILIARY DILATION  11/28/2018   Procedure: BILIARY DILATION;  Surgeon: Lemar Lofty., MD;  Location: [facility] ENDOSCOPY;  Service: Gastroenterology;;   BILIARY DILATION  03/11/2020   Procedure: BILIARY DILATION;  Surgeon: Lemar Lofty., MD;  Location: [facility] ENDOSCOPY;  Service: Gastroenterology;;   BILIARY STENT PLACEMENT  04/01/2018   Procedure: BILIARY STENT PLACEMENT;  Surgeon: Lemar Lofty., MD;  Location: [facility] ENDOSCOPY;  Service: Gastroenterology;;  BILIARY STENT PLACEMENT  09/12/2018   Procedure: BILIARY STENT PLACEMENT;  Surgeon: Meridee Score Netty Starring., MD;  Location: [facility] ENDOSCOPY;  Service: Gastroenterology;;   BILIARY STENT PLACEMENT  11/28/2018   Procedure: BILIARY STENT PLACEMENT;  Surgeon: Lemar Lofty., MD;  Location: [facility] ENDOSCOPY;  Service: Gastroenterology;;   BIOPSY  04/01/2018   Procedure: BIOPSY;  Surgeon: Lemar Lofty., MD;  Location: [facility] ENDOSCOPY;  Service: Gastroenterology;;   BIOPSY  09/12/2018   Procedure: BIOPSY;  Surgeon: Lemar Lofty., MD;  Location: [facility] ENDOSCOPY;  Service: Gastroenterology;;   BIOPSY  11/28/2018   Procedure: BIOPSY;  Surgeon: Lemar Lofty., MD;  Location: [facility] ENDOSCOPY;  Service: Gastroenterology;;   BIOPSY  03/11/2020   Procedure: BIOPSY;  Surgeon: Lemar Lofty., MD;  Location: [facility] ENDOSCOPY;  Service: Gastroenterology;;   bladder cancer      x 8 cystoscopy   CERVICAL DISCECTOMY     ACDF   CHOLECYSTECTOMY     COLONOSCOPY  11/17/2005   normal    CORONARY ARTERY BYPASS GRAFT     x4   CORONARY STENT INTERVENTION N/A 01/05/2019   Procedure: CORONARY STENT INTERVENTION;  Surgeon: Yvonne Kendall, MD;  Location: [facility] INVASIVE CV LAB;  Service: Cardiovascular;  Laterality: N/A;   CYSTOSCOPY WITH BIOPSY N/A 03/27/2022   Procedure: CYSTOSCOPY WITH BIOPSY;  Surgeon: Bjorn Pippin, MD;  Location: [facility] ORS;  Service: Urology;  Laterality: N/A;   CYSTOSCOPY WITH RETROGRADE PYELOGRAM, URETEROSCOPY AND STENT PLACEMENT Right 03/27/2022   Procedure: CYSTOSCOPY WITH RIGHT RETROGRADE PYELOGRAM, URETEROSCOPY AND STENT PLACEMENT urethral dilation;  Surgeon: Bjorn Pippin, MD;  Location: [facility] ORS;  Service: Urology;  Laterality: Right;   ENDOSCOPIC MUCOSAL RESECTION  09/12/2018   Procedure: ENDOSCOPIC MUCOSAL RESECTION;  Surgeon: Meridee Score Netty Starring., MD;  Location: [facility] ENDOSCOPY;  Service: Gastroenterology;;   ENDOSCOPIC RETROGRADE CHOLANGIOPANCREATOGRAPHY (ERCP) WITH PROPOFOL N/A 04/01/2018   Procedure: ENDOSCOPIC RETROGRADE CHOLANGIOPANCREATOGRAPHY (ERCP) WITH PROPOFOL;  Surgeon: Lemar Lofty., MD;  Location: [facility] ENDOSCOPY;  Service: Gastroenterology;  Laterality: N/A;   ENDOSCOPIC RETROGRADE CHOLANGIOPANCREATOGRAPHY (ERCP) WITH PROPOFOL N/A 09/12/2018   Procedure: ENDOSCOPIC RETROGRADE CHOLANGIOPANCREATOGRAPHY (ERCP) WITH PROPOFOL;  Surgeon: Meridee Score Netty Starring., MD;  Location: [facility] ENDOSCOPY;  Service: Gastroenterology;  Laterality: N/A;   ENDOSCOPIC RETROGRADE CHOLANGIOPANCREATOGRAPHY  (ERCP) WITH PROPOFOL N/A 03/11/2020   Procedure: ENDOSCOPIC RETROGRADE CHOLANGIOPANCREATOGRAPHY (ERCP) WITH PROPOFOL;  Surgeon: Meridee Score Netty Starring., MD;  Location: [facility] ENDOSCOPY;  Service: Gastroenterology;  Laterality: N/A;   ENDOSCOPIC RETROGRADE CHOLANGIOPANCREATOGRAPHY (ERCP) WITH PROPOFOL N/A 06/03/2020   Procedure: ENDOSCOPIC RETROGRADE CHOLANGIOPANCREATOGRAPHY (ERCP) WITH PROPOFOL;  Surgeon: Meridee Score Netty Starring., MD;  Location: [facility] ENDOSCOPY;  Service: Gastroenterology;  Laterality: N/A;   ERCP N/A 11/28/2018   Procedure: ENDOSCOPIC RETROGRADE CHOLANGIOPANCREATOGRAPHY (ERCP) +EGD with spyglass;  Surgeon: Meridee Score Netty Starring., MD;  Location: [facility] ENDOSCOPY;  Service: Gastroenterology;  Laterality: N/A;   ESOPHAGOGASTRODUODENOSCOPY  04/29/2010   ESOPHAGOGASTRODUODENOSCOPY (EGD) WITH PROPOFOL N/A 04/01/2018   Procedure: ESOPHAGOGASTRODUODENOSCOPY (EGD) WITH PROPOFOL;  Surgeon: Meridee Score Netty Starring., MD;  Location: [facility] ENDOSCOPY;  Service: Gastroenterology;  Laterality: N/A;   ESOPHAGOGASTRODUODENOSCOPY (EGD) WITH PROPOFOL N/A 09/12/2018   Procedure: ESOPHAGOGASTRODUODENOSCOPY (EGD) WITH PROPOFOL;  Surgeon: Meridee Score Netty Starring., MD;  Location: [facility] ENDOSCOPY;  Service: Gastroenterology;  Laterality: N/A;   ESOPHAGOGASTRODUODENOSCOPY (EGD) WITH PROPOFOL N/A 11/28/2018   Procedure: ESOPHAGOGASTRODUODENOSCOPY (EGD) WITH PROPOFOL;  Surgeon: Meridee Score Netty Starring., MD;  Location: [facility] ENDOSCOPY;  Service: Gastroenterology;  Laterality: N/A;   ESOPHAGOGASTRODUODENOSCOPY (EGD) WITH PROPOFOL N/A 03/11/2020   Procedure: ESOPHAGOGASTRODUODENOSCOPY (EGD) WITH PROPOFOL;  Surgeon: Lemar Lofty.,  MD;  Location: [facility] ENDOSCOPY;  Service: Gastroenterology;  Laterality: N/A;   ESOPHAGOGASTRODUODENOSCOPY (EGD) WITH PROPOFOL N/A 06/03/2020   Procedure: ESOPHAGOGASTRODUODENOSCOPY (EGD) WITH PROPOFOL;  Surgeon: Meridee Score Netty Starring., MD;  Location: [facility] ENDOSCOPY;  Service: Gastroenterology;   Laterality: N/A;   EUS  04/01/2018   Procedure: FULL UPPER ENDOSCOPIC ULTRASOUND (EUS) RADIAL;  Surgeon: Lemar Lofty., MD;  Location[facility] ENDOSCOPY;  Service: Gastroenterology;;   EUS N/A 09/12/2018   Procedure: UPPER ENDOSCOPIC ULTRASOUND (EUS) RADIAL;  Surgeon: Lemar Lofty., MD;  Location: [facility] ENDOSCOPY;  Service: Gastroenterology;  Laterality: N/A;   FINE NEEDLE ASPIRATION  09/12/2018   Procedure: FINE NEEDLE ASPIRATION (FNA) LINEAR;  Surgeon: Lemar Lofty., MD;  Location: [facility] ENDOSCOPY;  Service: Gastroenterology;;   HAND SURGERY Left 2018   saw accident   HEMOSTASIS CLIP PLACEMENT  09/12/2018   Procedure: HEMOSTASIS CLIP PLACEMENT;  Surgeon: Lemar Lofty., MD;  Location: [facility] ENDOSCOPY;  Service: Gastroenterology;;   HEMOSTASIS CLIP PLACEMENT  06/03/2020   Procedure: HEMOSTASIS CLIP PLACEMENT;  Surgeon: Lemar Lofty., MD;  Location: [facility] ENDOSCOPY;  Service: Gastroenterology;;   I & D EXTREMITY Left 11/24/2016   Procedure: IRRIGATION AND DEBRIDEMENT LEFT HAND, THUMB, INDEX, MIDDLE, RING, AND SMALL FINGERS WITH RECONSTRUCTION;  Surgeon: Dominica Severin, MD;  Location: [facility] OR;  Service: Orthopedics;  Laterality: Left;   KNEE ARTHROSCOPY Left    LEFT HEART CATH AND CORS/GRAFTS ANGIOGRAPHY N/A 01/05/2019   Procedure: LEFT HEART CATH AND CORS/GRAFTS ANGIOGRAPHY;  Surgeon: Yvonne Kendall, MD;  Location: [facility] INVASIVE CV LAB;  Service: Cardiovascular;  Laterality: N/A;   LUMBAR LAMINECTOMY     and fusion x 2   NASAL SINUS SURGERY     POPLITEAL SYNOVIAL CYST EXCISION     REMOVAL OF STONES  04/01/2018   Procedure: REMOVAL OF STONES;  Surgeon: Meridee Score Netty Starring., MD;  Location: [facility] ENDOSCOPY;  Service: Gastroenterology;;   REMOVAL OF STONES  09/12/2018   Procedure: REMOVAL OF STONES;  Surgeon: Lemar Lofty., MD;  Location: [facility] ENDOSCOPY;  Service: Gastroenterology;;   REMOVAL OF STONES  11/28/2018   Procedure: REMOVAL OF STONES;  Surgeon:  Lemar Lofty., MD;  Location: [facility] ENDOSCOPY;  Service: Gastroenterology;;   REMOVAL OF STONES  03/11/2020   Procedure: REMOVAL OF STONES;  Surgeon: Lemar Lofty., MD;  Location: [facility] ENDOSCOPY;  Service: Gastroenterology;;   REMOVAL OF STONES  06/03/2020   Procedure: REMOVAL OF STONES;  Surgeon: Lemar Lofty., MD;  Location: [facility] ENDOSCOPY;  Service: Gastroenterology;;   Gaspar Bidding DILATION N/A 09/12/2018   Procedure: Jacklyn Shell;  Surgeon: Lemar Lofty., MD;  Location: [facility] ENDOSCOPY;  Service: Gastroenterology;  Laterality: N/A;   SAVORY DILATION N/A 11/28/2018   Procedure: SAVORY DILATION;  Surgeon: Meridee Score Netty Starring., MD;  Location: [facility] ENDOSCOPY;  Service: Gastroenterology;  Laterality: N/A;   SEPTOPLASTY Bilateral 05/26/2021   Procedure: SEPTOPLASTY;  Surgeon: Newman Pies, MD;  Location: [facility];  Service: ENT;  Laterality: Bilateral;   SPHINCTEROTOMY  04/01/2018   Procedure: Dennison Mascot;  Surgeon: Mansouraty, Netty Starring., MD;  Location: [facility] ENDOSCOPY;  Service: Gastroenterology;;   Burman Freestone CHOLANGIOSCOPY N/A 11/28/2018   Procedure: NWGNFAOZ CHOLANGIOSCOPY;  Surgeon: Lemar Lofty., MD;  Location: [facility] ENDOSCOPY;  Service: Gastroenterology;  Laterality: N/A;   SPYGLASS CHOLANGIOSCOPY N/A 06/03/2020   Procedure: SPYGLASS CHOLANGIOSCOPY;  Surgeon: Lemar Lofty., MD;  Location: [facility] ENDOSCOPY;  Service: Gastroenterology;  Laterality: N/A;   STENT REMOVAL  09/12/2018   Procedure: STENT REMOVAL;  Surgeon: Corliss Parish  Montez Hageman., MD;  Location: [facility] ENDOSCOPY;  Service: Gastroenterology;;   Francine Graven REMOVAL  11/28/2018   Procedure: STENT REMOVAL;  Surgeon: Lemar Lofty., MD;  Location: [facility] ENDOSCOPY;  Service: Gastroenterology;;   Francine Graven REMOVAL  03/11/2020   Procedure: STENT REMOVAL;  Surgeon: Lemar Lofty., MD;  Location: [facility] ENDOSCOPY;  Service: Gastroenterology;;   Brooke Dare INJECTION  09/12/2018    Procedure: SUBMUCOSAL LIFTING INJECTION;  Surgeon: Lemar Lofty., MD;  Location: [facility] ENDOSCOPY;  Service: Gastroenterology;;    Family History  Problem Relation Age of Onset   Melanoma Mother    Stroke Father    Hypertension Father    Coronary artery disease Other    Colon cancer Neg Hx    Esophageal cancer Neg Hx    Stomach cancer Neg Hx    Rectal cancer Neg Hx    Pancreatic cancer Neg Hx    Liver disease Neg Hx    Inflammatory bowel disease Neg Hx    Social History:  reports that he quit smoking about 46 years ago. His smoking use included cigarettes. He has a 2.50 pack-year smoking history. He has never used smokeless tobacco. He reports that he does not drink alcohol and does not use drugs.  Allergies: No Known Allergies  No medications prior to admission.    Results for orders placed or performed in visit on 06/02/22 (from the past 48 hour(s))  Vitamin B12     Status: None   Collection Time: 06/02/22 10:00 AM  Result Value Ref Range   Vitamin B-12 466 211 - 911 pg/mL  Urine microalbumin-creatinine with uACR     Status: Abnormal   Collection Time: 06/02/22 10:00 AM  Result Value Ref Range   Microalb, Ur 44.7 (H) 0.0 - 1.9 mg/dL   Creatinine,U 16.1 mg/dL   Microalb Creat Ratio 51.7 (H) 0.0 - 30.0 mg/g  Hemoglobin A1c     Status: None   Collection Time: 06/02/22 10:00 AM  Result Value Ref Range   Hgb A1c MFr Bld 6.1 4.6 - 6.5 %    Comment: Glycemic Control Guidelines for People with Diabetes:Non Diabetic:  <6%Goal of Therapy: <7%Additional Action Suggested:  >8%   PSA     Status: None   Collection Time: 06/02/22 10:00 AM  Result Value Ref Range   PSA 0.21 0.10 - 4.00 ng/mL    Comment: Test performed using Access Hybritech PSA Assay, a parmagnetic partical, chemiluminecent immunoassay.  Lipid panel     Status: Abnormal   Collection Time: 06/02/22 10:00 AM  Result Value Ref Range   Cholesterol 121 0 - 200 mg/dL    Comment: ATP III Classification        Desirable:  < 200 mg/dL               Borderline High:  200 - 239 mg/dL          High:  > = 096 mg/dL   Triglycerides 045.4 0.0 - 149.0 mg/dL    Comment: Normal:  <098 mg/dLBorderline High:  150 - 199 mg/dL   HDL 11.91 (L) >47.82 mg/dL   VLDL 95.6 0.0 - 21.3 mg/dL   LDL Cholesterol 61 0 - 99 mg/dL   Total CHOL/HDL Ratio 3     Comment:                Men          Women1/2 Average Risk     3.4  3.3Average Risk          5.0          4.42X Average Risk          9.6          7.13X Average Risk          15.0          11.0                       NonHDL 85.59     Comment: NOTE:  Non-HDL goal should be 30 mg/dL higher than patient's LDL goal (i.e. LDL goal of < 70 mg/dL, would have non-HDL goal of < 100 mg/dL)  Comprehensive metabolic panel     Status: Abnormal   Collection Time: 06/02/22 10:00 AM  Result Value Ref Range   Sodium 142 135 - 145 mEq/L   Potassium 4.6 3.5 - 5.1 mEq/L   Chloride 104 96 - 112 mEq/L   CO2 31 19 - 32 mEq/L   Glucose, Bld 129 (H) 70 - 99 mg/dL   BUN 20 6 - 23 mg/dL   Creatinine, Ser 1.61 (H) 0.40 - 1.50 mg/dL   Total Bilirubin 0.5 0.2 - 1.2 mg/dL   Alkaline Phosphatase 58 39 - 117 U/L   AST 16 0 - 37 U/L   ALT 7 0 - 53 U/L   Total Protein 6.8 6.0 - 8.3 g/dL   Albumin 3.9 3.5 - 5.2 g/dL   GFR 09.60 (L) >45.40 mL/min    Comment: Calculated using the CKD-EPI Creatinine Equation (2021)   Calcium 9.2 8.4 - 10.5 mg/dL  CBC with Differential/Platelet     Status: None   Collection Time: 06/02/22 10:00 AM  Result Value Ref Range   WBC 6.3 4.0 - 10.5 K/uL   RBC 4.53 4.22 - 5.81 Mil/uL   Hemoglobin 13.1 13.0 - 17.0 g/dL   HCT 98.1 19.1 - 47.8 %   MCV 86.7 78.0 - 100.0 fl   MCHC 33.4 30.0 - 36.0 g/dL   RDW 29.5 62.1 - 30.8 %   Platelets 158.0 150.0 - 400.0 K/uL   Neutrophils Relative % 56.2 43.0 - 77.0 %   Lymphocytes Relative 30.4 12.0 - 46.0 %   Monocytes Relative 7.7 3.0 - 12.0 %   Eosinophils Relative 4.9 0.0 - 5.0 %   Basophils Relative 0.8 0.0 - 3.0 %    Neutro Abs 3.6 1.4 - 7.7 K/uL   Lymphs Abs 1.9 0.7 - 4.0 K/uL   Monocytes Absolute 0.5 0.1 - 1.0 K/uL   Eosinophils Absolute 0.3 0.0 - 0.7 K/uL   Basophils Absolute 0.1 0.0 - 0.1 K/uL   No results found.  Review of Systems  There were no vitals taken for this visit. Physical Exam   Assessment/Plan  Proceed as planned with RIGHT robotic nephroureterectomy. Risks, benefits, alternatives, expected peri-op course discussed previously and reiterated today. He understands that his baseline GFR compromise, CAD history, and advanced age increases risk of peri-op complicaitons including MI and mortality substantially.     Loletta Parish., MD 06/03/2022, 6:55 AM

## 2022-06-03 NOTE — Op Note (Signed)
NAME: [name], [name]. MEDICAL RECORD NO: [id number] ACCOUNT NO: [id number] DATE OF BIRTH: 08-Jan-1941 FACILITY: [facility] LOCATION: [id number] PHYSICIAN: Sebastian Ache, MD  Operative Report   DATE OF PROCEDURE: 06/03/2022  PREOPERATIVE DIAGNOSIS:  Right renal pelvis cancer, high-grade with refractory hematuria.  PROCEDURE: 1  Robotic-assisted laparoscopic right nephroureterectomy. 2.  Cystoscopy with urethral dilation.  ESTIMATED BLOOD LOSS:  50 mL  COMPLICATIONS:  None.  SPECIMEN:  Right kidney with ureter and bladder cuff en bloc and ureteral stent.  ASSISTANT:  Harrie Foreman, PA  FINDINGS:   1.  Two artery one very early branching, one vein right renovascular anatomy. 2.  Significant desmoplastic reaction around the kidney and upper ureter. 3.  Significant desmoplastic reaction around the inferior aspect of the liver and duodenum. 4.  Partially recurrent bulbar urethral stricture 12-French pre-dilation 16-French post-dilation.  DRAINS:   1.  Jackson-Pratt drain to bulb suction. 2.  Foley per urethra to straight drain.  INDICATIONS:  The patient is a very pleasant 82 year old man with history of bladder cancer 30 years ago.  He is compliant with surveillance.  He was found on workup of hematuria to have a significant volume right renal pelvis cancer.  This is high-grade  and not completely amenable to endoscopic management.  His renal function is acceptable and bleeding has been problematic from this lesion as well. The staging imaging was clinically localized.  Options were discussed for management including curative  and noncurative protocols.  He will proceed with curative intent nephroureterectomy.  He has received cardiac clearance.  He has been off his Plavix as instructed. He presents for this today.  He does have extensive history of upper GI procedure,  informed consent obtained and placed in medical record.  PROCEDURE DETAILS:  The patient being identified and verified,  procedure being right nephroureterectomy was confirmed.  Procedure timeout was performed.  Intravenous antibiotics administered.  General endotracheal anesthesia induced.  An attempt was made  to place Foley catheter per urethra to straight drain.  However, this did met resistance in the area of the presumed bulbar urethra.  He does have history of bulbar stricture and has required endoscopic evaluation previously.   Cystourethroscopy was performed using a 16-French flexible cystoscope and in the bulbar urethra, there was a relatively short segment area of narrowing.  It was estimated to be approximately 12-French.  This fortunately was  able to be dilated with the scope alone and very gentle pressure under direct vision navigates the urinary bladder, which was mildly trabeculated.  Sensory wire was advanced, over which a new 16-French Council-tip catheter was placed per urethra to  straight drain, 10 mL water in the balloon.  The patient was then repositioned into a right side up full flank position pulling 15 degrees of table flexion, superior arm elevator, axillary roll, sequential compression devices, bottom leg bent, top leg  straight.  He was further fastened up to operating table using 3-inch tape over foam padding across supraxiphoid chest and his pelvis.  Sterile field was created first using chlorhexidine gluconate to his left flank and abdomen and a high flow, low pressure, pneumoperitoneum was  obtained using Veress technique in the right upper quadrant, having passed the aspiration and drop test.  An 8 mm robotic camera port was then position approximately one handbreadth inferior to the costal margin.  Laparoscopic examination of peritoneal  cavity did reveal some omentum draped over the area of the right upper quadrant.  There was some adhesions.  Additional ports were  placed as follows:  Right subcostal 8 mm robotic port, approximately 2 fingerbreadths lateral to the plane of the camera  port,  8 mm inferior robotic port, approximately 4 fingerbreadths superior to pubic ramus and 2 fingerbreadths medial to the plane of camera port and a right far lateral 8 mm robotic port, approximately 4 fingerbreadths superomedial to the anterior iliac  spine and two assistant port sites in the midline, one in the supraumbilical crease and another approximately 4 fingerbreadths superior to this.  An additional 5 mm robotic port was placed in the subxiphoid location for liver retraction through which a  self-locking grasper was used to elevate the inferior liver edge off the anterior surface of Gerota's fascia.  Robot was docked and passed the electronic checks.  Initial attention was directed at development of retroperitoneum and there were significant  adhesions mostly omentum in the area of the inferior liver edge and draping it towards the right upper quadrant so the most likely consistent with his prior history of upper GI endoscopy and was very carefully released and rotated medially.  The  ascending colon was then encountered.  Incision was made lateral to this from the area of the cecum towards the area of the hepatic flexure carefully mobilized medially.  The duodenum was encountered and very, very carefully kocherized medially.  There  was some desmoplastic reaction around this, but no evidence of any injury.  Lower pole kidney area was identified, placed on gentle lateral traction.  Dissection proceeded medial to this, the ureter and gonadal vessels were encountered.  There was some  desmoplasia around the ureter as expected given his chronic stent and tumor was placed on lateral traction.  Gonadal vessels were allowed to fall medially.  Dissection proceeded within this triangle towards the renal hilum.  Renal hilum was somewhat  complex consisting of a single very early branching artery superiorly another artery inferiorly and single vein renal vascular anatomy.  The branching of the artery was such  that I felt that the most prudent means of management would be to take these  three separate arteries. A purple clip was placed proximal on the inferior most 2 and vascular stapler distal to these and similarly the upper branch controlled using Hem-o-Lok clip proximal, vascular stapler distal and another vascular stapler was used  on the vein.  This resulted in excellent hemostatic control of the hilum and the superior attachments between the adrenal gland and upper pole of the kidney were also released using vascular stapler resulting in excellent hemostatic control.  Superior  attachments and lateral attachments were taken down with cautery scissors.  This completely exposed and mobilized the kidney specimen, which was then just tethered down the ureter alone.  The vessel loop was placed and the ureter was traced inferiorly  towards the area of the deep pelvis.  Incision made lateral to the right median umbilical ligament towards the area of internal wall.  This furthered a peritoneal incision just lateral to the bladder.  The ureter was circumferentially mobilized into the  true pelvis to meet the area of the iliac vessels towards the area of the ureterovesical junction.  The obturator nerve and vessels were visualized and the dissection was quite complete all the way to the UVJ.  I was quite happy with the safety and  completeness of this dissection.  A 3-0 V-Loc suture was placed in the medial aspect of this approximately 1 cm anterior to the area of the UPJ and then using a  partial pluck technique placing the ureters on superior traction.  This was circumferentially  dissected.  The anterior surface purposely incised. In situ stent was exposed and released out of this and the posterior circumference was then released.  A Hem-o-lock clip was placed on the ureter to prevent inadvertent antegrade urinary spillage and to keep  the stent in situ with the specimen, which was inspected and completely intact.   The previously placed suture was then run over the area of the small cystotomy, which closed it well visually.  Sponge and needle counts were correct.  Hemostasis was  excellent.  We achieved the goals extubated portion of procedure today.  Closed suction drain was brought out the previous lateral most robotic port site into the peritoneal cavity.  Robot was then undocked.  Specimen was retrieved by connecting the 2  previous assistant port sites in the midline, removing the kidney plus right ureter and bladder cuff specimen en bloc setting aside for permanent pathology.  Extraction site was closed at the level of the fascia using figure-of-eight PDS x8 followed by  reapproximation of Scarpa's with running Vicryl.  All incision sites were infiltrated with dilute lipolyzed Marcaine and closed at the level of the skin using subcuticular Monocryl following Dermabond.  Procedure was then terminated.  The patient  tolerated procedure well, no immediate perioperative complications.  The patient was taken to postanesthesia care in stable condition.  Plan for inpatient admission.  Please note, first assistant, Harrie Foreman, was crucial for all portions of the surgery today.  She provided invaluable retraction, suctioning, specimen manipulation, vascular clipping, vascular stapling, robotic and instrument exchange and general first  assistance.   PUS D: 06/03/2022 3:20:50 pm T: 06/03/2022 8:13:00 pm  JOB: [id number]/ [id number]

## 2022-06-03 NOTE — Discharge Instructions (Signed)
Activity:  You are encouraged to ambulate frequently (about every hour during waking hours) to help prevent blood clots from forming in your legs or lungs.  However, you should not engage in any heavy lifting (> 10-15 lbs), strenuous activity, or straining. Diet: You should advance your diet as instructed by your physician.  It will be normal to have some bloating, nausea, and abdominal discomfort intermittently. Prescriptions:  You will be provided a prescription for pain medication to take as needed.  If your pain is not severe enough to require the prescription pain medication, you may take extra strength Tylenol instead which will have less side effects.  You should also take a prescribed stool softener to avoid straining with bowel movements as the prescription pain medication may constipate you. Incisions: You may remove your dressing bandages 48 hours after surgery if not removed in the hospital.  You will either have some small staples or special tissue glue at each of the incision sites. Once the bandages are removed (if present), the incisions may stay open to air.  You may start showering (but not soaking or bathing in water) the 2nd day after surgery and the incisions simply need to be patted dry after the shower.  No additional care is needed. What to call us about: You should call the office [phone]) if you develop fever > 101 or develop persistent vomiting.   You can restart Plavix Mon 06/08/22.  You may resume advil, aleve, vitamins, and supplements 7 days after surgery.

## 2022-06-03 NOTE — Anesthesia Procedure Notes (Signed)
Procedure Name: Intubation Date/Time: 06/03/2022 12:06 PM  Performed by: Marny Lowenstein, CRNAPre-anesthesia Checklist: Patient identified, Emergency Drugs available, Suction available and Patient being monitored Patient Re-evaluated:Patient Re-evaluated prior to induction Oxygen Delivery Method: Circle system utilized Preoxygenation: Pre-oxygenation with 100% oxygen Induction Type: IV induction Ventilation: Mask ventilation without difficulty and Oral airway inserted - appropriate to patient size Laryngoscope Size: Hyacinth Meeker and 2 Grade View: Grade I Tube type: Oral Tube size: 7.5 mm Number of attempts: 1 Airway Equipment and Method: Stylet Placement Confirmation: ETT inserted through vocal cords under direct vision, positive ETCO2 and breath sounds checked- equal and bilateral Secured at: 22 cm Tube secured with: Tape Dental Injury: Teeth and Oropharynx as per pre-operative assessment

## 2022-06-04 ENCOUNTER — Encounter (HOSPITAL_COMMUNITY): Payer: Self-pay | Admitting: Urology

## 2022-06-04 LAB — BASIC METABOLIC PANEL
Anion gap: 7 (ref 5–15)
BUN: 32 mg/dL — ABNORMAL HIGH (ref 8–23)
CO2: 26 mmol/L (ref 22–32)
Calcium: 7.8 mg/dL — ABNORMAL LOW (ref 8.9–10.3)
Chloride: 100 mmol/L (ref 98–111)
Creatinine, Ser: 2.18 mg/dL — ABNORMAL HIGH (ref 0.61–1.24)
GFR, Estimated: 30 mL/min — ABNORMAL LOW (ref >60)
Glucose, Bld: 178 mg/dL — ABNORMAL HIGH (ref 70–99)
Potassium: 4 mmol/L (ref 3.5–5.1)
Sodium: 133 mmol/L — ABNORMAL LOW (ref 135–145)

## 2022-06-04 LAB — HEMOGLOBIN AND HEMATOCRIT, BLOOD
HCT: 32.1 % — ABNORMAL LOW (ref 39.0–52.0)
Hemoglobin: 10.3 g/dL — ABNORMAL LOW (ref 13.0–17.0)

## 2022-06-04 MED ORDER — NAPHAZOLINE-GLYCERIN 0.012-0.25 % OP SOLN
1.0000 [drp] | Freq: Four times a day (QID) | OPHTHALMIC | Status: DC | PRN
  Filled 2022-06-04: qty 15

## 2022-06-04 MED ORDER — CHLORHEXIDINE GLUCONATE CLOTH 2 % EX PADS
6.0000 | MEDICATED_PAD | Freq: Every day | CUTANEOUS | Status: DC
  Administered 2022-06-04: 6 via TOPICAL

## 2022-06-04 NOTE — Progress Notes (Signed)
1 Day Post-Op Subjective: NAEO. Some eye discomfort, placed wet washcloth over eyes throughout the night with improvement by this am. Soreness in abdomen. Otherwise no complaints.  Objective: Vital signs in last 24 hours: Temp:  [97.3 F (36.3 C)-98.5 F (36.9 C)] 98.1 F (36.7 C) (05/16 0512) Pulse Rate:  [53-62] 55 (05/16 0512) Resp:  [15-20] 20 (05/16 0512) BP: (106-146)/(50-72) 115/58 (05/16 0512) SpO2:  [86 %-100 %] 96 % (05/16 0512) Weight:  [77.7 kg] 77.7 kg (05/15 1037)  Intake/Output from previous day: 05/15 0701 - 05/16 0700 In: 2758.3 [I.V.:2358.3; IV Piggyback:400] Out: 670 [Urine:600; Drains:70] Intake/Output this shift: No intake/output data recorded.  Physical Exam:  General: Alert and oriented CV: RRR Lungs: Clear Abdomen: Soft, ND. JP with ss output. Incisions: c/d/i Ext: NT, No erythema  Lab Results: Recent Labs    06/02/22 1000 06/03/22 1539 06/04/22 0503  HGB 13.1 12.2* 10.3*  HCT 39.3 38.0* 32.1*   BMET Recent Labs    06/03/22 1539 06/04/22 0503  NA 139 133*  K 4.5 4.0  CL 105 100  CO2 24 26  GLUCOSE 115* 178*  BUN 26* 32*  CREATININE 1.83* 2.18*  CALCIUM 8.5* 7.8*     Studies/Results: No results found.  Assessment/Plan: #Right renal pelvis cancer - S/p R Nephroureterectomy - Continue foley catheter x10-14 days; cystogram prior to removal - D/c JP drain today - Trend Cr, UOP - PRN analgesia - Medlock, regular diet - AROBF - Will continue to observe and anticipate discharge tomorrow with continued clinical improvement  #Eye pain - Visine eye drops PRN - If failure to improve, will consult anesthesia/ophtho for eval for possible corneal injury   LOS: 1 day   [name] 06/04/2022, 7:19 AM

## 2022-06-04 NOTE — Progress Notes (Signed)
  Transition of Care [facility]) Screening Note   Patient Details  Name: [name] Date of Birth: 20-Dec-1940   Transition of Care [facility]) CM/SW Contact:    Lanier Clam, RN Phone Number: 06/04/2022, 11:25 AM    Transition of Care Department [facility]) has reviewed patient and no TOC needs have been identified at this time. We will continue to monitor patient advancement through interdisciplinary progression rounds. If new patient transition needs arise, please place a TOC consult.

## 2022-06-05 ENCOUNTER — Other Ambulatory Visit (HOSPITAL_COMMUNITY): Payer: Self-pay

## 2022-06-05 LAB — SURGICAL PATHOLOGY

## 2022-06-05 NOTE — Discharge Summary (Signed)
[name] is an 82 year old male presenting to [facility] for scheduled robotic assisted laparoscopic right nephroureterectomy and treatment of high-grade upper pole renal cancer.  PMH also significant for recurrent bladder cancer, stage III renal insufficiency, and bulbar urethral stricture.  The patient's procedure was completed without complication and he was admitted to the floor for labs, monitoring, and pain management.  On rounds this morning he is alert, oriented, no distress.  He has not yet had return of bowel function but is tolerating a regular diet.  He has been ambulating the floor and has met most of his postoperative metrics.  He is cleared to discharge home today with close follow-up in clinic, where trial of void and reassessment will be performed.  Physical Exam Vitals reviewed.  Constitutional:      Appearance: Normal appearance. He is not ill-appearing, toxic-appearing or diaphoretic.  HENT:     Head: Normocephalic.  Cardiovascular:     Rate and Rhythm: Normal rate.  Pulmonary:     Effort: Pulmonary effort is normal.  Abdominal:     General: Abdomen is flat.     Palpations: Abdomen is soft.     Comments: Incisions are C/D/I.  No erythema or purulent drainage.  Genitourinary:    Comments: Foley catheter in place draining clear yellow urine. Skin:    General: Skin is warm and dry.  Neurological:     General: No focal deficit present.     Mental Status: He is alert and oriented to person, place, and time.  Psychiatric:        Mood and Affect: Mood normal.        Behavior: Behavior normal.    Vitals:   06/05/22 1101 06/05/22 1353  BP:  126/61  Pulse:  72  Resp:  18  Temp:  99.5 F (37.5 C)  SpO2: (!) 88% 91%   Allergies as of 06/05/2022   No Known Allergies      Medication List     STOP taking these medications    B-D 3CC LUER-LOK SYR 23GX1" 23G X 1" 3 ML Misc Generic drug: SYRINGE-NEEDLE (DISP) 3 ML   clopidogrel 75 MG  tablet Commonly known as: PLAVIX   Coenzyme Q10 300 MG Caps   cyanocobalamin 1000 MCG/ML injection Commonly known as: VITAMIN B12   IRON PO   multivitamin with minerals tablet   PROBIOTIC GUMMIES PO       TAKE these medications    acetaminophen 500 MG tablet Commonly known as: TYLENOL Take 1,000 mg by mouth every 6 (six) hours as needed for moderate pain.   amLODipine 2.5 MG tablet Commonly known as: NORVASC TAKE 2 TABLETS BY MOUTH DAILY Notes to patient: TAKE THIS EVENING   aspirin EC 81 MG tablet Take 81 mg by mouth every evening. Swallow whole. Notes to patient: START ON [facility] 06/08/2022   BENGAY EX Apply 1 application topically daily as needed (pain).   cetirizine 10 MG tablet Commonly known as: ZYRTEC Take 10 mg by mouth daily as needed for allergies.   clobetasol 0.05 % external solution Commonly known as: TEMOVATE Apply 1 Application topically 2 (two) times daily.   docusate sodium 100 MG capsule Commonly known as: COLACE Take 1 capsule (100 mg total) by mouth 2 (two) times daily. Notes to patient: TAKE THIS EVENING   ezetimibe 10 MG tablet Commonly known as: ZETIA TAKE 1 TABLET BY MOUTH DAILY   fluticasone 50 MCG/ACT nasal spray Commonly known as: FLONASE Place 1 spray  into both nostrils daily as needed for allergies.   isosorbide mononitrate 60 MG 24 hr tablet Commonly known as: IMDUR TAKE 1 TABLET BY MOUTH DAILY Notes to patient: TAKE TOMORROW   METAMUCIL FIBER PO Take 1 capsule by mouth in the morning.   nebivolol 10 MG tablet Commonly known as: BYSTOLIC TAKE 1 TABLET BY MOUTH DAILY   NEEDLE (DISP) 25 G 25G X 1" Misc Commonly known as: B-D DISP NEEDLE 25GX1" Inject 1000 mcg into muscle once a month.   nitroGLYCERIN 0.4 MG SL tablet Commonly known as: NITROSTAT DISSOLVE ONE TABLET UNDER THE TONGUE EVERY 5 MINUTES AS NEEDED FOR CHEST PAIN.   Opcon-A 0.027-0.315 % Soln Generic drug: Naphazoline-Pheniramine Place 1 drop into both  eyes daily as needed (itching eyes).   oxyCODONE-acetaminophen 10-325 MG tablet Commonly known as: PERCOCET Take 1 tablet by mouth every 6 (six) hours as needed for pain. What changed:  when to take this Another medication with the same name was removed. Continue taking this medication, and follow the directions you see here. Notes to patient: TAKE EVERY 6 HOURS AS NEEDED FOR PAIN   pantoprazole 40 MG tablet Commonly known as: PROTONIX Take 1 tablet (40 mg total) by mouth 2 (two) times daily.   rOPINIRole 2 MG tablet Commonly known as: REQUIP TAKE 1 TABLET BY MOUTH AT  BEDTIME   rosuvastatin 10 MG tablet Commonly known as: CRESTOR TAKE 1 TABLET BY MOUTH DAILY   sucralfate 1 GM/10ML suspension Commonly known as: CARAFATE Take 10 mLs (1 g total) by mouth 2 (two) times daily.   venlafaxine 75 MG tablet Commonly known as: EFFEXOR Take 1 tablet (75 mg total) by mouth daily.               Discharge Care Instructions  (From admission, onward)           Start     Ordered   06/05/22 0000  Discharge wound care:       Comments: Clean incisions gently with soap and water.  Okay to shower avoiding having stream directly on incision.  Do not soak in bath or any body of water for at least 1 week postoperatively.   06/05/22 1502

## 2022-06-05 NOTE — Progress Notes (Signed)
Mobility Specialist - Progress Note   06/05/22 1101  Oxygen Therapy  SpO2 (!) 88 %  O2 Device Room Air  Patient Activity (if Appropriate) Ambulating  Mobility  Activity Ambulated with assistance in hallway  Level of Assistance Modified independent, requires aide device or extra time  Assistive Device Front wheel walker  Distance Ambulated (ft) 500 ft  Activity Response Tolerated well  Mobility Referral Yes  $Mobility charge 1 Mobility  Mobility Specialist Start Time (ACUTE ONLY) 1040  Mobility Specialist Stop Time (ACUTE ONLY) 1100  Mobility Specialist Time Calculation (min) (ACUTE ONLY) 20 min   Pt received in bed and agreeable to mobility. After ambulation ~245ft pt c/o feeling SOB. O2 at 88%. Encouraged a standing rest break (~28min) & pursed lip breaths allowing O2 to come back up to 90%. No other complaints during session.  Pt to bed after session with all needs met.    During mobility: 83 HR, 88% SpO2 (RA)  Chief Technology Officer

## 2022-06-08 ENCOUNTER — Ambulatory Visit: Payer: Self-pay

## 2022-06-08 ENCOUNTER — Telehealth: Payer: Self-pay

## 2022-06-08 NOTE — Transitions of Care (Post Inpatient/ED Visit) (Signed)
   06/08/2022  Name: KAIMANA PARKER MRN: [id number] DOB: 10-16-1940  Today's TOC FU Call Status: Today's TOC FU Call Status:: Unsuccessul Call (1st Attempt) Unsuccessful Call (1st Attempt) Date: 06/08/22  Attempted to reach the patient regarding the most recent Inpatient/ED visit.  Follow Up Plan: Additional outreach attempts will be made to reach the patient to complete the Transitions of Care (Post Inpatient/ED visit) call.      Antionette Fairy, RN,BSN,CCM [facility] Care Management Care Management Community Coordinator Direct Phone: [phone] Toll Free: [phone] Fax: [phone]

## 2022-06-08 NOTE — Chronic Care Management (AMB) (Signed)
   06/08/2022  [name] [birth year] [id number]   Reason for Encounter: Patient is not currently enrolled in the CCM program. CCM status changed to previously enrolled.   [facility]/Chronic Care Management [phone]

## 2022-06-09 ENCOUNTER — Telehealth: Payer: Self-pay

## 2022-06-09 ENCOUNTER — Ambulatory Visit: Payer: Medicare Other | Admitting: Cardiovascular Disease

## 2022-06-09 ENCOUNTER — Other Ambulatory Visit: Payer: Self-pay | Admitting: Internal Medicine

## 2022-06-09 NOTE — Transitions of Care (Post Inpatient/ED Visit) (Signed)
06/09/2022  Name: RAHKEEM TORBECK MRN: [id number] DOB: 1940/04/07  Today's TOC FU Call Status: Today's TOC FU Call Status:: Successful TOC FU Call Competed TOC FU Call Complete Date: 06/09/22 (Incoming call from daughter-Ellen returning RN CM call.)  Transition Care Management Follow-up Telephone Call Date of Discharge: 06/05/22 Discharge Facility: [facility]) Type of Discharge: Inpatient Admission Primary Inpatient Discharge Diagnosis:: "cancer of renal pelvis,right" How have you been since you were released from the hospital?: Better (Daughter states pt is doing good. He is getting around okay-pain is managed/controlled at present. Foley is in place and no issues noted.) Any questions or concerns?: No  Items Reviewed: Did you receive and understand the discharge instructions provided?: Yes Medications obtained,verified, and reconciled?: No Medications Not Reviewed Reasons:: Other: (Daughter not with meds at present to review them-confirms they have all meds and no issues/cocnerns) Any new allergies since your discharge?: No Dietary orders reviewed?: Yes Type of Diet Ordered:: low salt/heart healthy Do you have support at home?: Yes People in Home: alone Name of Support/Comfort Primary Source: daugther reports that they are checking on him frequently and has several people assisitng with caring for pt  Medications Reviewed Today: Medications Reviewed Today     Reviewed by Marny Lowenstein, CRNA (Certified Registered Nurse Anesthetist) on 06/03/22 at 1033  Med List Status: Complete   Medication Order Taking? Sig Documenting Provider Last Dose Status Informant  acetaminophen (TYLENOL) 500 MG tablet [id number] Yes Take 1,000 mg by mouth every 6 (six) hours as needed for moderate pain. [provider] Past Week Active Family Member  amLODipine (NORVASC) 2.5 MG tablet [id number] Yes TAKE 2 TABLETS BY MOUTH DAILY Lennette Bihari, MD 06/03/2022 0600 Active Family Member   aspirin EC 81 MG tablet [id number] Yes Take 81 mg by mouth every evening. Swallow whole. [provider] 05/28/2022 Active Family Member  B-D 3CC LUER-LOK SYR 23GX1" 23G X 1" 3 ML MISC [id number] No USE WITH B12 INJECTION Philip Aspen, Limmie Patricia, MD 05/28/2022 Active Family Member  cetirizine (ZYRTEC) 10 MG tablet [id number] No Take 10 mg by mouth daily as needed for allergies. [provider] More than a month Active Family Member  clobetasol (TEMOVATE) 0.05 % external solution [id number] Yes Apply 1 Application topically 2 (two) times daily. Philip Aspen, Limmie Patricia, MD Past Week Active   clopidogrel (PLAVIX) 75 MG tablet [id number] Yes TAKE 1 TABLET BY MOUTH ONCE  DAILY Lennette Bihari, MD 05/28/2022 Active Family Member  Coenzyme Q10 300 MG CAPS [id number] Yes Take 300 mg by mouth every evening. [provider] Past Week Active Family Member  cyanocobalamin (VITAMIN B12) 1000 MCG/ML injection [id number] Yes INJECT 1 ML SUBCUTANEOUSLY ONCE EVERY MONTH Philip Aspen, Limmie Patricia, MD Past Week Active Family Member           Med Note Richardean Canal May 21, 2022  9:34 AM)    ezetimibe (ZETIA) 10 MG tablet [id number] Yes TAKE 1 TABLET BY MOUTH DAILY Philip Aspen, Limmie Patricia, MD 06/03/2022 0600 Active Family Member  Ferrous Sulfate (IRON PO) [id number] Yes Take 1 tablet by mouth in the morning. [provider] 06/02/2022 Active Family Member  fluticasone (FLONASE) 50 MCG/ACT nasal spray [id number] Yes Place 1 spray into both nostrils daily as needed for allergies. [provider] 06/02/2022 Active Family Member  isosorbide mononitrate (IMDUR) 60 MG 24 hr tablet [id number] Yes TAKE 1 TABLET BY MOUTH DAILY Lennette Bihari, MD 06/03/2022 0600 Active Family Member  Menthol, Topical Analgesic, (BENGAY EX) [id number] Yes Apply 1 application topically daily as needed (pain). [provider] Past Week Active Family Member  METAMUCIL FIBER PO [id number] Yes Take 1  capsule by mouth in the morning. [provider] Past Week Active Family Member  Multiple Vitamins-Minerals (MULTIVITAMIN WITH MINERALS) tablet [id number] Yes Take 1 tablet by mouth daily. Gummy [provider] Past Week Active Family Member  Naphazoline-Pheniramine (OPCON-A) 0.027-0.315 % SOLN [id number] Yes Place 1 drop into both eyes daily as needed (itching eyes). [provider] Past Month Active Family Member  nebivolol (BYSTOLIC) 10 MG tablet [id number] Yes TAKE 1 TABLET BY MOUTH DAILY Lennette Bihari, MD 06/03/2022 060 Active Family Member  NEEDLE, DISP, 25 G (B-D DISP NEEDLE 25GX1") 25G X 1" MISC [id number] Yes Inject 1000 mcg into muscle once a month. Philip Aspen, Limmie Patricia, MD Past Week Active Family Member  nitroGLYCERIN (NITROSTAT) 0.4 MG SL tablet [id number] Yes DISSOLVE ONE TABLET UNDER THE TONGUE EVERY 5 MINUTES AS NEEDED FOR CHEST PAIN. Philip Aspen, Limmie Patricia, MD  Active Family Member  oxyCODONE-acetaminophen [facility]) 10-325 MG tablet [id number] Yes Take 1 tablet by mouth 2 (two) times daily as needed for pain. Philip Aspen, Limmie Patricia, MD 06/03/2022 0500 Active   oxyCODONE-acetaminophen (PERCOCET) 10-325 MG tablet [id number] Yes Take 1 tablet by mouth 2 (two) times daily as needed for pain. Philip Aspen, Limmie Patricia, MD 06/03/2022 0500 Active   oxyCODONE-acetaminophen (PERCOCET) 10-325 MG tablet [id number] Yes Take 1 tablet by mouth 2 (two) times daily as needed for pain. Philip Aspen, Limmie Patricia, MD 06/03/2022 0500 Active            Med Note Alyse Low Jun 03, 2022 10:29 AM) Rochele Pages 1/2 tablet  pantoprazole (PROTONIX) 40 MG tablet [id number] Yes Take 1 tablet (40 mg total) by mouth 2 (two) times daily. Mansouraty, Netty Starring., MD 06/03/2022 0600 Active Family Member  Probiotic Product (PROBIOTIC GUMMIES PO) [id number] Yes Take 1 capsule by mouth in the morning. [provider] Past Week Active Family Member  rOPINIRole (REQUIP) 2 MG tablet  [id number] Yes TAKE 1 TABLET BY MOUTH AT  BEDTIME Philip Aspen, Limmie Patricia, MD 06/02/2022 Active Family Member  rosuvastatin (CRESTOR) 10 MG tablet [id number] Yes TAKE 1 TABLET BY MOUTH DAILY Lennette Bihari, MD 06/02/2022 Active Family Member  sucralfate (CARAFATE) 1 GM/10ML suspension [id number] Yes Take 10 mLs (1 g total) by mouth 2 (two) times daily. Mansouraty, Netty Starring., MD Past Month Active Family Member  venlafaxine [facility]) 75 MG tablet [id number] Yes Take 1 tablet (75 mg total) by mouth daily. Philip Aspen, Limmie Patricia, MD 06/02/2022 Active Family Member            Home Care and Equipment/Supplies: Were Home Health Services Ordered?: NA Any new equipment or medical supplies ordered?: NA  Functional Questionnaire: Do you need assistance with bathing/showering or dressing?: No Do you need assistance with meal preparation?: No Do you need assistance with eating?: No Do you have difficulty maintaining continence: No Do you need assistance with getting out of bed/getting out of a chair/moving?: No Do you have difficulty managing or taking your medications?: No  Follow up appointments reviewed: PCP Follow-up appointment confirmed?: NA Specialist Hospital Follow-up appointment confirmed?: Yes Date of Specialist follow-up appointment?: 06/16/22 Follow-Up Specialty Provider:: Dr. Berneice Heinrich Do you need transportation to your follow-up appointment?: No (daughter states she will be taking pt to appt) Do you understand care options if your condition(s) worsen?: Yes-patient verbalized  understanding  SDOH Interventions Today    Flowsheet Row Most Recent Value  SDOH Interventions   Food Insecurity Interventions Intervention Not Indicated  Transportation Interventions Intervention Not Indicated      TOC Interventions Today    Flowsheet Row Most Recent Value  TOC Interventions   TOC Interventions Discussed/Reviewed TOC Interventions Discussed      Interventions Today    Flowsheet  Row Most Recent Value  General Interventions   General Interventions Discussed/Reviewed General Interventions Discussed, Doctor Visits  Doctor Visits Discussed/Reviewed Doctor Visits Discussed, PCP, Specialist  PCP/Specialist Visits Compliance with follow-up visit  Education Interventions   Education Provided Provided Education  Provided Verbal Education On Nutrition, When to see the doctor, Medication, Other  [foley catheter mgmt]  Nutrition Interventions   Nutrition Discussed/Reviewed Nutrition Discussed  Pharmacy Interventions   Pharmacy Dicussed/Reviewed Pharmacy Topics Discussed, Medications and their functions  Safety Interventions   Safety Discussed/Reviewed Safety Discussed       Alessandra Grout [facility] Care Management Care Management Community Coordinator Direct Phone: [phone] Toll Free: [phone] Fax: [phone]

## 2022-06-09 NOTE — Transitions of Care (Post Inpatient/ED Visit) (Signed)
   06/09/2022  Name: [name] MRN: [id number] DOB: 06-25-1940  Today's TOC FU Call Status: Today's TOC FU Call Status:: Unsuccessful Call (2nd Attempt) Unsuccessful Call (2nd Attempt) Date: 06/09/22  Attempted to reach the patient regarding the most recent Inpatient/ED visit.  Follow Up Plan: Additional outreach attempts will be made to reach the patient to complete the Transitions of Care (Post Inpatient/ED visit) call.     Antionette Fairy, RN,BSN,CCM [facility] Care Management Care Management Community Coordinator Direct Phone: [phone] Toll Free: [phone] Fax: [phone]

## 2022-06-16 DIAGNOSIS — N35012 Post-traumatic membranous urethral stricture: Secondary | ICD-10-CM | POA: Diagnosis not present

## 2022-06-16 DIAGNOSIS — C651 Malignant neoplasm of right renal pelvis: Secondary | ICD-10-CM | POA: Diagnosis not present

## 2022-06-16 DIAGNOSIS — C678 Malignant neoplasm of overlapping sites of bladder: Secondary | ICD-10-CM | POA: Diagnosis not present

## 2022-06-26 ENCOUNTER — Telehealth: Payer: Self-pay | Admitting: Internal Medicine

## 2022-06-26 NOTE — Telephone Encounter (Signed)
Patient's daughter has expressed that they will no longer be need care for the time being, since surgery. Patient's child has requested to have all appointments cancelled.

## 2022-06-30 ENCOUNTER — Other Ambulatory Visit: Payer: Medicare Other

## 2022-06-30 ENCOUNTER — Ambulatory Visit: Payer: Medicare Other | Admitting: Internal Medicine

## 2022-07-06 ENCOUNTER — Telehealth: Payer: Self-pay

## 2022-07-06 NOTE — Progress Notes (Signed)
   Care Guide Note  07/06/2022 Name: [name] MRN: [id number] DOB: Jan 29, 1940  Referred by: Philip Aspen, Limmie Patricia, MD Reason for referral : Care Coordination (Outreach to rs with [facility] pharm d )   [name] is a 82 y.o. year old male who is a primary care patient of Philip Aspen, Limmie Patricia, MD. [name] was referred to the pharmacist for assistance related to HTN and DM.    Successful contact was made with the patient to discuss pharmacy services including being ready for the pharmacist to call at least 5 minutes before the scheduled appointment time, to have medication bottles and any blood sugar or blood pressure readings ready for review. The patient agreed to meet with the pharmacist via with the pharmacist via telephone visit on (date/time).  07/15/2022  [name], [name] Care Guide [facility]  [place], [place] [address] Direct Dial: [phone] [email or web address]

## 2022-07-06 NOTE — Progress Notes (Signed)
   Care Guide Note  07/06/2022 Name: [name] MRN: [id number] DOB: 1940-07-24  Referred by: Philip Aspen, Limmie Patricia, MD Reason for referral : Care Coordination (Outreach to rs with [facility] pharm d )   [name] is a 82 y.o. year old male who is a primary care patient of Philip Aspen, Limmie Patricia, MD. [name] was referred to the pharmacist for assistance related to HTN and DM.    An unsuccessful telephone outreach was attempted today to contact the patient who was referred to the pharmacy team for assistance with medication management. Additional attempts will be made to contact the patient.   [name], RMA Care Guide [facility]  [place], [place] [address] Direct Dial: [phone] [email or web address]

## 2022-07-15 ENCOUNTER — Other Ambulatory Visit: Payer: Medicare Other

## 2022-07-15 NOTE — Progress Notes (Signed)
   07/15/2022  Patient ID: [name], male   DOB: Jan 12, 1941, 82 y.o.   MRN: [id number]  Patient outreach for scheduled telephone visit to transition care to [facility] Pharm[facility] unsuccessful.  Called and left voicemail on home phone x2 with my name and direct number for patient to call at his convenience.  Tried to call mobile number listed, too; and this is no longer a working number.  Will work with team scheduler to try to get appointment rescheduled.  [name], PharmD, DPLA

## 2022-07-24 ENCOUNTER — Other Ambulatory Visit: Payer: Self-pay | Admitting: Nurse Practitioner

## 2022-07-24 DIAGNOSIS — K229 Disease of esophagus, unspecified: Secondary | ICD-10-CM

## 2022-07-25 ENCOUNTER — Emergency Department (HOSPITAL_COMMUNITY): Admission: EM | Admit: 2022-07-25 | Payer: Medicare Other | Source: Home / Self Care

## 2022-08-03 ENCOUNTER — Telehealth: Payer: Self-pay

## 2022-08-03 NOTE — Progress Notes (Signed)
   Care Guide Note  08/03/2022 Name: [name] MRN: [id number] DOB: 04/04/1940  Referred by: Philip Aspen, Limmie Patricia, MD Reason for referral : Care Coordination (Outreach to reschedule with Pharm d )   [name] is a 82 y.o. year old male who is a primary care patient of Philip Aspen, Limmie Patricia, MD. [name] was referred to the pharmacist for assistance related to DM.    An unsuccessful telephone outreach was attempted today to contact the patient who was referred to the pharmacy team for assistance with medication management. Additional attempts will be made to contact the patient.   [name], RMA Care Guide [facility]  [place], [place] [address] Direct Dial: [phone] [name]

## 2022-08-03 NOTE — Progress Notes (Signed)
Error

## 2022-08-14 ENCOUNTER — Other Ambulatory Visit: Payer: Self-pay | Admitting: Internal Medicine

## 2022-08-14 DIAGNOSIS — E038 Other specified hypothyroidism: Secondary | ICD-10-CM

## 2022-08-19 ENCOUNTER — Other Ambulatory Visit: Payer: Medicare Other

## 2022-08-19 NOTE — Progress Notes (Signed)
   08/19/2022  Patient ID: [name], male   DOB: 1940-03-11, 82 y.o.   MRN: [id number]  Outreach for scheduled telephone visit with [name], and he requested that I call his daughter, [name], that takes care of his medications.  He stated he did not have any questions or concerns at this time.  I contacted [name] and have a telephone visit to review [name] medications next Wednesday at 10:30am.  [name], PharmD, DPLA

## 2022-08-25 ENCOUNTER — Other Ambulatory Visit: Payer: Self-pay | Admitting: Cardiovascular Disease

## 2022-08-26 ENCOUNTER — Other Ambulatory Visit: Payer: Medicare Other

## 2022-08-26 DIAGNOSIS — I10 Essential (primary) hypertension: Secondary | ICD-10-CM

## 2022-08-26 NOTE — Progress Notes (Signed)
08/26/2022 Name: [name] MRN: [id number] DOB: Nov 19, 1940  Chief Complaint  Patient presents with   Medication Management   [name] is a 82 y.o. year old male who presented for a telephone visit to establish care with [facility] PharmD.  Telephone visit was conducted with patient's daughter, [name] ([place]), that handles his medications  Subjective:  Care Team: Primary Care Provider: Philip Aspen, Limmie Patricia, MD  Medication Access/Adherence  Current Pharmacy:  [facility] [address], [place] - [address] [place] [address] Phone: [phone] Fax: [phone]  [facility] - [place], [place] - [address] [place] [address] Phone: [phone] Fax: [phone]  [facility] [address], [place] - [address] [place] [address] [place] [address] Phone: [phone] Fax: [phone]  Patient reports affordability concerns with their medications: No  Patient reports access/transportation concerns to their pharmacy: No  Receives medications through [facility] Patient reports adherence concerns with their medications:  No  Daughter does weekly pill organizer and states he rarely misses any medication doses  Diabetes: Current medications: None -06/02/22:  A1c 6.1, eGFR 30, Microalbumin Creatinine ratio >30  Hypertension: Current medications: amlodipine 5mg  daily, isosorbide [place] ER 60mg  daily, nebivolol 10mg  daily -Medications previously tried:  patient cannot tolerate ACE/ARB due to hyperkalemia with prior therapy -Recent clinic BP 126/61 -Patient has been taking 2 tablets of amlodipine 2.5mg , and daughter states taking 1 tablet of 5mg  would simplify his routine  Hyperlipidemia/ASCVD Risk Reduction Current lipid lowering medications: rosuvastatin 10mg  daily, ezetemibe 10mg  daily  Antiplatelet regimen: ASA 81mg  daily  -CAD and CHF diagnoses    Objective: Lab Results  Component Value Date   HGBA1C 6.1 06/02/2022   Lab Results  Component Value Date   CREATININE 2.18 (H) 06/04/2022   BUN 32 (H) 06/04/2022   NA 133 (L) 06/04/2022   K 4.0 06/04/2022   CL 100 06/04/2022   CO2 26 06/04/2022   Lab Results  Component Value Date   CHOL 121 06/02/2022   HDL 35.60 (L) 06/02/2022   LDLCALC 61 06/02/2022   LDLDIRECT 90.3 04/17/2008   TRIG 123.0 06/02/2022   CHOLHDL 3 06/02/2022   Medications Reviewed Today     Reviewed by [name], RPH (Pharmacist) on 08/26/22 at 1050  Med List Status: <None>   Medication Order Taking? Sig Documenting Provider Last Dose Status Informant  acetaminophen (TYLENOL) 500 MG tablet [id number] Yes Take 1,000 mg by mouth every 6 (six) hours as needed for moderate pain. [provider] Taking Active Family Member  amLODipine (NORVASC) 2.5 MG tablet [id number] Yes TAKE 2 TABLETS BY MOUTH DAILY Lennette Bihari, MD Taking Active Family Member  aspirin EC 81 MG tablet [id number] Yes Take 81 mg by mouth every evening. Swallow whole. [provider] Taking Active Family Member  cetirizine (ZYRTEC) 10 MG tablet [id number] Yes Take 10 mg by mouth daily as needed for allergies. [provider] Taking Active Family Member  clopidogrel (PLAVIX) 75 MG tablet [id number] Yes Take 75 mg by mouth daily. [provider] Taking Active   Coenzyme Q10 200 MG capsule [id number] Yes Take 200 mg by mouth daily. [provider] Taking Active   cyanocobalamin (VITAMIN B12) 1000 MCG/ML injection [id number] Yes Inject 1,000 mcg into the skin every 30 (thirty) days. [provider] Taking Active   ezetimibe (ZETIA) 10 MG tablet [id number] Yes TAKE 1 TABLET BY  MOUTH DAILY Philip Aspen, Limmie Patricia, MD Taking Active Family Member  Fe Bisgly-Vit C-Vit B12-FA (GENTLE IRON PO) [id number] Yes Take by mouth. daily [provider] Taking Active   fluticasone (FLONASE) 50 MCG/ACT  nasal spray [id number] Yes Place 1 spray into both nostrils daily as needed for allergies. [provider] Taking Active Family Member  isosorbide mononitrate (IMDUR) 60 MG 24 hr tablet [id number] Yes TAKE 1 TABLET BY MOUTH DAILY Lennette Bihari, MD Taking Active Family Member  Menthol, Topical Analgesic, [facility] EX) [id number] Yes Apply 1 application topically daily as needed (pain). [provider] Taking Active Family Member  METAMUCIL FIBER PO [id number] Yes Take 1 capsule by mouth in the morning. [provider] Taking Active Family Member  Multiple Vitamin (MULTIVITAMIN) tablet [id number] Yes Take 1 tablet by mouth daily. [provider] Taking Active   Naphazoline-Pheniramine (OPCON-A) 0.027-0.315 % SOLN [id number] Yes Place 1 drop into both eyes daily as needed (itching eyes). [provider] Taking Active Family Member  nebivolol (BYSTOLIC) 10 MG tablet [id number] Yes TAKE 1 TABLET BY MOUTH DAILY Lennette Bihari, MD Taking Active Family Member  NEEDLE, DISP, 25 G (B-D DISP NEEDLE 25GX1") 25G X 1" MISC [id number] Yes Inject 1000 mcg into muscle once a month. Philip Aspen, Limmie Patricia, MD Taking Active Family Member  nitroGLYCERIN (NITROSTAT) 0.4 MG SL tablet [id number] Yes DISSOLVE ONE TABLET UNDER THE TONGUE EVERY 5 MINUTES AS NEEDED FOR CHEST PAIN. Philip Aspen, Limmie Patricia, MD Taking Active   oxyCODONE-acetaminophen [facility]) 10-325 MG tablet [id number] Yes Take 1 tablet by mouth every 6 (six) hours as needed for pain. Harrie Foreman, PA-C Taking Active   pantoprazole (PROTONIX) 40 MG tablet [id number] Yes TAKE 1 TABLET BY MOUTH ONCE  DAILY Arnaldo Natal, NP Taking Active   polyethylene glycol powder (GLYCOLAX/MIRALAX) 17 GM/SCOOP powder [id number] Yes Take 1 Container by mouth daily. As needed [provider] Taking Active   rOPINIRole (REQUIP) 2 MG tablet [id number] Yes TAKE 1 TABLET BY MOUTH AT  BEDTIME Philip Aspen, Limmie Patricia, MD  Taking Active Family Member  rosuvastatin (CRESTOR) 10 MG tablet [id number] Yes TAKE 1 TABLET BY MOUTH DAILY Lennette Bihari, MD Taking Active   saccharomyces boulardii (FLORASTOR) 250 MG capsule [id number] Yes Take 250 mg by mouth 2 (two) times daily. [provider] Taking Active   venlafaxine (EFFEXOR) 75 MG tablet [id number] Yes TAKE 1 TABLET BY MOUTH DAILY Philip Aspen, Limmie Patricia, MD Taking Active            Assessment/Plan:   Diabetes: - Currently controlled - Evaluated patient eligibility for treatment with Jardiance 10mg  daily based on diabetes, CKD, and CAD diagnosis.  However, with a recent history of renal cancer and urethral stricture, SGLT2 therapy would not be recommended.  - Continue current management with lifestyle modifications  Hypertension: - Currently controlled - Continue current regimen and regular follow-up with providers - Prescription pending for amlodipine 5mg  1 tablet daily pending for PCP to sign  Hyperlipidemia/ASCVD Risk Reduction: - Currently controlled.  - Continue current regimen and regular follow-up with providers.   Follow Up Plan: None scheduled at this time but can as needed/recommended per PCP  [name], PharmD, DPLA

## 2022-09-03 ENCOUNTER — Other Ambulatory Visit: Payer: Self-pay | Admitting: Internal Medicine

## 2022-09-07 MED ORDER — AMLODIPINE BESYLATE 5 MG PO TABS: 5.0000 mg | ORAL_TABLET | Freq: Every day | ORAL | 1 refills | Status: DC

## 2022-10-05 ENCOUNTER — Other Ambulatory Visit: Payer: Self-pay | Admitting: Cardiovascular Disease

## 2022-10-05 ENCOUNTER — Other Ambulatory Visit: Payer: Self-pay | Admitting: Internal Medicine

## 2022-10-05 DIAGNOSIS — G894 Chronic pain syndrome: Secondary | ICD-10-CM

## 2022-10-05 MED ORDER — OXYCODONE-ACETAMINOPHEN 10-325 MG PO TABS
1.0000 | ORAL_TABLET | Freq: Four times a day (QID) | ORAL | 0 refills | Status: AC | PRN
Start: 2022-10-05 — End: ?

## 2022-10-05 NOTE — Telephone Encounter (Signed)
Prescription Request  10/05/2022  LOV: 06/02/2022  What is the name of the medication or equipment? oxyCODONE-acetaminophen (PERCOCET) 10-325 MG tablet   Have you contacted your pharmacy to request a refill? No   Which pharmacy would you like this sent to?  [facility] [address], [place] - [address] [place] [address] Phone: [phone] Fax: [phone]     Patient notified that their request is being sent to the clinical staff for review and that they should receive a response within 2 business days.   Please advise at [facility] [phone]

## 2022-10-05 NOTE — Telephone Encounter (Signed)
Refill sent.

## 2022-10-09 NOTE — Telephone Encounter (Signed)
Pt asking why he only received 10 tablets when he picked up his oxyCODONE-acetaminophen (PERCOCET) 10-325 MG tablet.

## 2022-10-12 ENCOUNTER — Emergency Department (HOSPITAL_COMMUNITY)
Admission: EM | Admit: 2022-10-12 | Discharge: 2022-10-12 | Discharge status: Against Medical Advice | Payer: Medicare Other

## 2022-10-12 NOTE — ED Notes (Signed)
Pt states that he cannot wait any longer. Pt leaves AMA

## 2022-10-19 ENCOUNTER — Other Ambulatory Visit: Payer: Self-pay | Admitting: Internal Medicine

## 2022-10-19 DIAGNOSIS — G894 Chronic pain syndrome: Secondary | ICD-10-CM

## 2022-10-19 NOTE — Telephone Encounter (Signed)
Prescription Request  10/19/2022  LOV: 06/02/2022  What is the name of the medication or equipment?  oxyCODONE-acetaminophen oxyCODONE-acetaminophen (PERCOCET) 10-325 MG tablet  Have you contacted your pharmacy to request a refill? No   Pt states he was only allowed 10 pills last time, and would like to know why? Please call Pt back to discuss.   Which pharmacy would you like this sent to?   [facility] [address] [place] - [address] [place] [address] Phone: [phone] Fax: [phone]  Patient notified that their request is being sent to the clinical staff for review and that they should receive a response within 2 business days.   Please advise at Mobile [phone] (mobile)

## 2022-10-20 MED ORDER — OXYCODONE-ACETAMINOPHEN 10-325 MG PO TABS
1.0000 | ORAL_TABLET | Freq: Four times a day (QID) | ORAL | 0 refills | Status: DC | PRN
Start: 2022-10-20 — End: 2022-11-12

## 2022-10-20 NOTE — Telephone Encounter (Signed)
Refill sent.

## 2022-10-29 ENCOUNTER — Other Ambulatory Visit: Payer: Self-pay | Admitting: Internal Medicine

## 2022-10-29 ENCOUNTER — Other Ambulatory Visit: Payer: Self-pay | Admitting: Nurse Practitioner

## 2022-10-29 DIAGNOSIS — K229 Disease of esophagus, unspecified: Secondary | ICD-10-CM

## 2022-11-12 ENCOUNTER — Encounter: Payer: Self-pay | Admitting: Internal Medicine

## 2022-11-12 ENCOUNTER — Other Ambulatory Visit: Payer: Self-pay | Admitting: Internal Medicine

## 2022-11-12 ENCOUNTER — Ambulatory Visit (INDEPENDENT_AMBULATORY_CARE_PROVIDER_SITE_OTHER): Payer: Medicare Other | Admitting: Internal Medicine

## 2022-11-12 VITALS — BP 130/70 | HR 59 | Temp 98.4°F | Wt 164.6 lb

## 2022-11-12 DIAGNOSIS — G894 Chronic pain syndrome: Secondary | ICD-10-CM | POA: Diagnosis not present

## 2022-11-12 DIAGNOSIS — H539 Unspecified visual disturbance: Secondary | ICD-10-CM

## 2022-11-12 DIAGNOSIS — M25552 Pain in left hip: Secondary | ICD-10-CM

## 2022-11-12 DIAGNOSIS — M25511 Pain in right shoulder: Secondary | ICD-10-CM

## 2022-11-12 DIAGNOSIS — M25551 Pain in right hip: Secondary | ICD-10-CM

## 2022-11-12 DIAGNOSIS — I10 Essential (primary) hypertension: Secondary | ICD-10-CM

## 2022-11-12 DIAGNOSIS — G8929 Other chronic pain: Secondary | ICD-10-CM

## 2022-11-12 DIAGNOSIS — E119 Type 2 diabetes mellitus without complications: Secondary | ICD-10-CM | POA: Diagnosis not present

## 2022-11-12 LAB — POCT GLYCOSYLATED HEMOGLOBIN (HGB A1C): Hemoglobin A1C: 5.6 % (ref 4.0–5.6)

## 2022-11-12 MED ORDER — OXYCODONE-ACETAMINOPHEN 10-325 MG PO TABS
1.0000 | ORAL_TABLET | Freq: Two times a day (BID) | ORAL | 0 refills | Status: DC
Start: 2022-11-12 — End: 2022-12-06

## 2022-11-12 MED ORDER — OXYCODONE-ACETAMINOPHEN 10-325 MG PO TABS
1.0000 | ORAL_TABLET | Freq: Two times a day (BID) | ORAL | 0 refills | Status: DC
Start: 2022-11-12 — End: 2023-02-11

## 2022-11-12 NOTE — Progress Notes (Addendum)
Established Patient Office Visit     CC/Reason for Visit: Follow-up chronic conditions  HPI: [name] is a 82 y.o. male who is coming in today for the above mentioned reasons. Past Medical History is significant for: Type 2 diabetes, heart failure with preserved ejection fraction, chronic pain syndrome, history of ascending cholangitis and pancreatitis.  He had a right renal cell carcinoma and had a nephrectomy in May 2024.  He states 2 days after his nephrectomy he was readmitted to the hospital with a heart attack double pneumonia and sepsis.  He is 100% sure that this hospital stay was within [facility] health network, unfortunately I am unable to find any records either from [facility] or in Care Everywhere.  He states he has seen his cardiologist's PA, Dr. Tresa Endo, since his discharge from the hospital and I cannot see this in his chart either.  I have to wonder if another chart was created for him, I tried accessing additional records by his name and date of birth and was unable to be successful.  He recently obtained his flu vaccine.  He states his depression is worse but believes it is because of his chronic pain.  He is currently identifying his right shoulder and bilateral hips as main sources of pain and is requesting referral to orthopedics to see if he can obtain a cortisone injection for pain relief.  He has a signed pain contract with me for oxycodone 10 mg for which he gets 60 a month.  He states he is having some vision changes to his left eye with inability to focus.   Past Medical/Surgical History: Past Medical History:  Diagnosis Date   Allergy    Anxiety    B12 deficiency anemia    Blood transfusion without reported diagnosis    CAD (coronary artery disease)    Cancer (HCC)    bladder, Right renal cell also   Chronic kidney disease    Colon polyps    COPD (chronic obstructive pulmonary disease) (HCC)    Depression    Diabetes mellitus (HCC)    Dyspnea    Esophagus,  Barrett's    GERD (gastroesophageal reflux disease)    Headache    History of bladder cancer    Bladder cancer "8 times"   History of hiatal hernia    Hyperlipidemia    Hypertension    Localized osteoarthrosis, lower leg    Myocardial infarction (HCC) 2021   Pneumonia    Restless leg syndrome    Sleep apnea    does not wear cpap   Stenosis of esophagus     Past Surgical History:  Procedure Laterality Date   BILIARY BRUSHING  04/01/2018   Procedure: BILIARY BRUSHING;  Surgeon: Lemar Lofty., MD;  Location: [facility] ENDOSCOPY;  Service: Gastroenterology;;   BILIARY BRUSHING  09/12/2018   Procedure: BILIARY BRUSHING;  Surgeon: Lemar Lofty., MD;  Location: [facility] ENDOSCOPY;  Service: Gastroenterology;;   BILIARY BRUSHING  11/28/2018   Procedure: BILIARY BRUSHING;  Surgeon: Lemar Lofty., MD;  Location: [facility] ENDOSCOPY;  Service: Gastroenterology;;   BILIARY BRUSHING  03/11/2020   Procedure: BILIARY BRUSHING;  Surgeon: Lemar Lofty., MD;  Location: [facility] ENDOSCOPY;  Service: Gastroenterology;;   BILIARY DILATION  09/12/2018   Procedure: BILIARY DILATION;  Surgeon: Lemar Lofty., MD;  Location: [facility] ENDOSCOPY;  Service: Gastroenterology;;   BILIARY DILATION  11/28/2018   Procedure: BILIARY DILATION;  Surgeon: Lemar Lofty., MD;  Location: [facility] ENDOSCOPY;  Service: Gastroenterology;;   BILIARY DILATION  03/11/2020   Procedure: BILIARY DILATION;  Surgeon: Meridee Score Netty Starring., MD;  Location: [facility] ENDOSCOPY;  Service: Gastroenterology;;   BILIARY STENT PLACEMENT  04/01/2018   Procedure: BILIARY STENT PLACEMENT;  Surgeon: Lemar Lofty., MD;  Location: [facility] ENDOSCOPY;  Service: Gastroenterology;;   BILIARY STENT PLACEMENT  09/12/2018   Procedure: BILIARY STENT PLACEMENT;  Surgeon: Lemar Lofty., MD;  Location: [facility] ENDOSCOPY;  Service: Gastroenterology;;   BILIARY STENT PLACEMENT  11/28/2018   Procedure: BILIARY STENT PLACEMENT;   Surgeon: Lemar Lofty., MD;  Location: [facility] ENDOSCOPY;  Service: Gastroenterology;;   BIOPSY  04/01/2018   Procedure: BIOPSY;  Surgeon: Lemar Lofty., MD;  Location: [facility] ENDOSCOPY;  Service: Gastroenterology;;   BIOPSY  09/12/2018   Procedure: BIOPSY;  Surgeon: Lemar Lofty., MD;  Location: [facility] ENDOSCOPY;  Service: Gastroenterology;;   BIOPSY  11/28/2018   Procedure: BIOPSY;  Surgeon: Lemar Lofty., MD;  Location: [facility] ENDOSCOPY;  Service: Gastroenterology;;   BIOPSY  03/11/2020   Procedure: BIOPSY;  Surgeon: Lemar Lofty., MD;  Location: [facility] ENDOSCOPY;  Service: Gastroenterology;;   bladder cancer      x 8 cystoscopy   CERVICAL DISCECTOMY     ACDF   CHOLECYSTECTOMY     COLONOSCOPY  11/17/2005   normal    CORONARY ARTERY BYPASS GRAFT     x4   CORONARY STENT INTERVENTION N/A 01/05/2019   Procedure: CORONARY STENT INTERVENTION;  Surgeon: Yvonne Kendall, MD;  Location: [facility] INVASIVE CV LAB;  Service: Cardiovascular;  Laterality: N/A;   CYSTOSCOPY WITH BIOPSY N/A 03/27/2022   Procedure: CYSTOSCOPY WITH BIOPSY;  Surgeon: Bjorn Pippin, MD;  Location: [facility] ORS;  Service: Urology;  Laterality: N/A;   CYSTOSCOPY WITH RETROGRADE PYELOGRAM, URETEROSCOPY AND STENT PLACEMENT Right 03/27/2022   Procedure: CYSTOSCOPY WITH RIGHT RETROGRADE PYELOGRAM, URETEROSCOPY AND STENT PLACEMENT urethral dilation;  Surgeon: Bjorn Pippin, MD;  Location: [facility] ORS;  Service: Urology;  Laterality: Right;   ENDOSCOPIC MUCOSAL RESECTION  09/12/2018   Procedure: ENDOSCOPIC MUCOSAL RESECTION;  Surgeon: Meridee Score Netty Starring., MD;  Location[facility]COPY;  Service: Gastroenterology;;   ENDOSCOPIC RETROGRADE CHOLANGIOPANCREATOGRAPHY (ERCP) WITH PROPOFOL N/A 04/01/2018   Procedure: ENDOSCOPIC RETROGRADE CHOLANGIOPANCREATOGRAPHY (ERCP) WITH PROPOFOL;  Surgeon: Lemar Lofty., MD;  Location: [facility] ENDOSCOPY;  Service: Gastroenterology;  Laterality: N/A;   ENDOSCOPIC RETROGRADE  CHOLANGIOPANCREATOGRAPHY (ERCP) WITH PROPOFOL N/A 09/12/2018   Procedure: ENDOSCOPIC RETROGRADE CHOLANGIOPANCREATOGRAPHY (ERCP) WITH PROPOFOL;  Surgeon: Meridee Score Netty Starring., MD;  Location: [facility] ENDOSCOPY;  Service: Gastroenterology;  Laterality: N/A;   ENDOSCOPIC RETROGRADE CHOLANGIOPANCREATOGRAPHY (ERCP) WITH PROPOFOL N/A 03/11/2020   Procedure: ENDOSCOPIC RETROGRADE CHOLANGIOPANCREATOGRAPHY (ERCP) WITH PROPOFOL;  Surgeon: Meridee Score Netty Starring., MD;  Location: [facility] ENDOSCOPY;  Service: Gastroenterology;  Laterality: N/A;   ENDOSCOPIC RETROGRADE CHOLANGIOPANCREATOGRAPHY (ERCP) WITH PROPOFOL N/A 06/03/2020   Procedure: ENDOSCOPIC RETROGRADE CHOLANGIOPANCREATOGRAPHY (ERCP) WITH PROPOFOL;  Surgeon: Meridee Score Netty Starring., MD;  Location: [facility] ENDOSCOPY;  Service: Gastroenterology;  Laterality: N/A;   ERCP N/A 11/28/2018   Procedure: ENDOSCOPIC RETROGRADE CHOLANGIOPANCREATOGRAPHY (ERCP) +EGD with spyglass;  Surgeon: Meridee Score Netty Starring., MD;  Location: [facility] ENDOSCOPY;  Service: Gastroenterology;  Laterality: N/A;   ESOPHAGOGASTRODUODENOSCOPY  04/29/2010   ESOPHAGOGASTRODUODENOSCOPY (EGD) WITH PROPOFOL N/A 04/01/2018   Procedure: ESOPHAGOGASTRODUODENOSCOPY (EGD) WITH PROPOFOL;  Surgeon: Meridee Score Netty Starring., MD;  Location: [facility] ENDOSCOPY;  Service: Gastroenterology;  Laterality: N/A;   ESOPHAGOGASTRODUODENOSCOPY (EGD) WITH PROPOFOL N/A 09/12/2018   Procedure: ESOPHAGOGASTRODUODENOSCOPY (EGD) WITH PROPOFOL;  Surgeon: Meridee Score Netty Starring., MD;  Location: [facility] ENDOSCOPY;  Service: Gastroenterology;  Laterality: N/A;   ESOPHAGOGASTRODUODENOSCOPY (EGD) WITH PROPOFOL N/A 11/28/2018   Procedure: ESOPHAGOGASTRODUODENOSCOPY (EGD) WITH PROPOFOL;  Surgeon: Meridee Score Netty Starring., MD;  Location: [facility] ENDOSCOPY;  Service: Gastroenterology;  Laterality: N/A;   ESOPHAGOGASTRODUODENOSCOPY (EGD) WITH PROPOFOL N/A 03/11/2020   Procedure: ESOPHAGOGASTRODUODENOSCOPY (EGD) WITH PROPOFOL;  Surgeon: Meridee Score Netty Starring.,  MD;  Location: [facility] ENDOSCOPY;  Service: Gastroenterology;  Laterality: N/A;   ESOPHAGOGASTRODUODENOSCOPY (EGD) WITH PROPOFOL N/A 06/03/2020   Procedure: ESOPHAGOGASTRODUODENOSCOPY (EGD) WITH PROPOFOL;  Surgeon: Meridee Score Netty Starring., MD;  Location: [facility] ENDOSCOPY;  Service: Gastroenterology;  Laterality: N/A;   EUS  04/01/2018   Procedure: FULL UPPER ENDOSCOPIC ULTRASOUND (EUS) RADIAL;  Surgeon: Lemar Lofty., MD;  Location: [facility] ENDOSCOPY;  Service: Gastroenterology;;   EUS N/A 09/12/2018   Procedure: UPPER ENDOSCOPIC ULTRASOUND (EUS) RADIAL;  Surgeon: Lemar Lofty., MD;  Location: [facility] ENDOSCOPY;  Service: Gastroenterology;  Laterality: N/A;   FINE NEEDLE ASPIRATION  09/12/2018   Procedure: FINE NEEDLE ASPIRATION (FNA) LINEAR;  Surgeon: Lemar Lofty., MD;  Location: [facility] ENDOSCOPY;  Service: Gastroenterology;;   HAND SURGERY Left 2018   saw accident   HEMOSTASIS CLIP PLACEMENT  09/12/2018   Procedure: HEMOSTASIS CLIP PLACEMENT;  Surgeon: Lemar Lofty., MD;  Location: [facility] ENDOSCOPY;  Service: Gastroenterology;;   HEMOSTASIS CLIP PLACEMENT  06/03/2020   Procedure: HEMOSTASIS CLIP PLACEMENT;  Surgeon: Lemar Lofty., MD;  Location: [facility] ENDOSCOPY;  Service: Gastroenterology;;   I & D EXTREMITY Left 11/24/2016   Procedure: IRRIGATION AND DEBRIDEMENT LEFT HAND, THUMB, INDEX, MIDDLE, RING, AND SMALL FINGERS WITH RECONSTRUCTION;  Surgeon: Dominica Severin, MD;  Location: [facility] OR;  Service: Orthopedics;  Laterality: Left;   KNEE ARTHROSCOPY Left    LEFT HEART CATH AND CORS/GRAFTS ANGIOGRAPHY N/A 01/05/2019   Procedure: LEFT HEART CATH AND CORS/GRAFTS ANGIOGRAPHY;  Surgeon: Yvonne Kendall, MD;  Location: [facility] INVASIVE CV LAB;  Service: Cardiovascular;  Laterality: N/A;   LUMBAR LAMINECTOMY     and fusion x 2   NASAL SINUS SURGERY     POPLITEAL SYNOVIAL CYST EXCISION     REMOVAL OF STONES  04/01/2018   Procedure: REMOVAL OF STONES;  Surgeon: Meridee Score Netty Starring., MD;  Location: [facility] ENDOSCOPY;  Service: Gastroenterology;;   REMOVAL OF STONES  09/12/2018   Procedure: REMOVAL OF STONES;  Surgeon: Lemar Lofty., MD;  Location: [facility] ENDOSCOPY;  Service: Gastroenterology;;   REMOVAL OF STONES  11/28/2018   Procedure: REMOVAL OF STONES;  Surgeon: Lemar Lofty., MD;  Location: [facility] ENDOSCOPY;  Service: Gastroenterology;;   REMOVAL OF STONES  03/11/2020   Procedure: REMOVAL OF STONES;  Surgeon: Lemar Lofty., MD;  Location: [facility] ENDOSCOPY;  Service: Gastroenterology;;   REMOVAL OF STONES  06/03/2020   Procedure: REMOVAL OF STONES;  Surgeon: Lemar Lofty., MD;  Location: [facility] ENDOSCOPY;  Service: Gastroenterology;;   ROBOT ASSITED LAPAROSCOPIC NEPHROURETERECTOMY Right 06/03/2022   Procedure: XI ROBOT ASSITED LAPAROSCOPIC NEPHROURETERECTOMY, FLEXIBLE CYSTOSCOPY;  Surgeon: Loletta Parish., MD;  Location: [facility] ORS;  Service: Urology;  Laterality: Right;  3 HRS   SAVORY DILATION N/A 09/12/2018   Procedure: SAVORY DILATION;  Surgeon: Meridee Score Netty Starring., MD;  Location: [facility] ENDOSCOPY;  Service: Gastroenterology;  Laterality: N/A;   SAVORY DILATION N/A 11/28/2018   Procedure: SAVORY DILATION;  Surgeon: Meridee Score Netty Starring., MD;  Location: [facility] ENDOSCOPY;  Service: Gastroenterology;  Laterality: N/A;   SEPTOPLASTY Bilateral 05/26/2021   Procedure: SEPTOPLASTY;  Surgeon: Newman Pies, MD;  Location: [facility];  Service: ENT;  Laterality: Bilateral;  SPHINCTEROTOMY  04/01/2018   Procedure: SPHINCTEROTOMY;  Surgeon: Mansouraty, Netty Starring., MD;  Location: [facility] ENDOSCOPY;  Service: Gastroenterology;;   Burman Freestone CHOLANGIOSCOPY N/A 11/28/2018   Procedure: GNFAOZHY CHOLANGIOSCOPY;  Surgeon: Lemar Lofty., MD;  Location: [facility] ENDOSCOPY;  Service: Gastroenterology;  Laterality: N/A;   SPYGLASS CHOLANGIOSCOPY N/A 06/03/2020   Procedure: SPYGLASS CHOLANGIOSCOPY;  Surgeon: Lemar Lofty., MD;  Location: [facility]  ENDOSCOPY;  Service: Gastroenterology;  Laterality: N/A;   STENT REMOVAL  09/12/2018   Procedure: STENT REMOVAL;  Surgeon: Lemar Lofty., MD;  Location: [facility] ENDOSCOPY;  Service: Gastroenterology;;   Francine Graven REMOVAL  11/28/2018   Procedure: STENT REMOVAL;  Surgeon: Lemar Lofty., MD;  Location: [facility] ENDOSCOPY;  Service: Gastroenterology;;   Francine Graven REMOVAL  03/11/2020   Procedure: STENT REMOVAL;  Surgeon: Lemar Lofty., MD;  Location: [facility] ENDOSCOPY;  Service: Gastroenterology;;   SUBMUCOSAL LIFTING INJECTION  09/12/2018   Procedure: SUBMUCOSAL LIFTING INJECTION;  Surgeon: Lemar Lofty., MD;  Location: [facility] ENDOSCOPY;  Service: Gastroenterology;;    Social History:  reports that he quit smoking about 46 years ago. His smoking use included cigarettes. He started smoking about 51 years ago. He has a 2.5 pack-year smoking history. He has never used smokeless tobacco. He reports that he does not drink alcohol and does not use drugs.  Allergies: No Known Allergies  Family History:  Family History  Problem Relation Age of Onset   Melanoma Mother    Stroke Father    Hypertension Father    Coronary artery disease Other    Colon cancer Neg Hx    Esophageal cancer Neg Hx    Stomach cancer Neg Hx    Rectal cancer Neg Hx    Pancreatic cancer Neg Hx    Liver disease Neg Hx    Inflammatory bowel disease Neg Hx      Current Outpatient Medications:    acetaminophen (TYLENOL) 500 MG tablet, Take 1,000 mg by mouth every 6 (six) hours as needed for moderate pain., Disp: , Rfl:    aspirin EC 81 MG tablet, Take 81 mg by mouth every evening. Swallow whole., Disp: , Rfl:    cetirizine (ZYRTEC) 10 MG tablet, Take 10 mg by mouth daily as needed for allergies., Disp: , Rfl:    clopidogrel (PLAVIX) 75 MG tablet, TAKE 1 TABLET BY MOUTH ONCE  DAILY, Disp: 90 tablet, Rfl: 2   Coenzyme Q10 200 MG capsule, Take 200 mg by mouth daily., Disp: , Rfl:    cyanocobalamin (VITAMIN B12)  1000 MCG/ML injection, Inject 1,000 mcg into the skin every 30 (thirty) days., Disp: , Rfl:    ezetimibe (ZETIA) 10 MG tablet, TAKE 1 TABLET BY MOUTH DAILY, Disp: 100 tablet, Rfl: 0   Fe Bisgly-Vit C-Vit B12-FA (GENTLE IRON PO), Take by mouth. daily, Disp: , Rfl:    fluticasone (FLONASE) 50 MCG/ACT nasal spray, Place 1 spray into both nostrils daily as needed for allergies., Disp: , Rfl:    isosorbide mononitrate (IMDUR) 60 MG 24 hr tablet, TAKE 1 TABLET BY MOUTH DAILY, Disp: 90 tablet, Rfl: 2   Menthol, Topical Analgesic, (BENGAY EX), Apply 1 application topically daily as needed (pain)., Disp: , Rfl:    METAMUCIL FIBER PO, Take 1 capsule by mouth in the morning., Disp: , Rfl:    Multiple Vitamin (MULTIVITAMIN) tablet, Take 1 tablet by mouth daily., Disp: , Rfl:    Naphazoline-Pheniramine (OPCON-A) 0.027-0.315 % SOLN, Place 1 drop into both eyes daily as needed (itching eyes)., Disp: ,  Rfl:    nebivolol (BYSTOLIC) 10 MG tablet, TAKE 1 TABLET BY MOUTH DAILY, Disp: 100 tablet, Rfl: 3   NEEDLE, DISP, 25 G (B-D DISP NEEDLE 25GX1") 25G X 1" MISC, Inject 1000 mcg into muscle once a month., Disp: 50 each, Rfl: 0   nitroGLYCERIN (NITROSTAT) 0.4 MG SL tablet, DISSOLVE ONE TABLET UNDER THE TONGUE EVERY 5 MINUTES AS NEEDED FOR CHEST PAIN., Disp: 25 tablet, Rfl: 0   pantoprazole (PROTONIX) 40 MG tablet, TAKE 1 TABLET BY MOUTH ONCE  DAILY, Disp: 100 tablet, Rfl: 2   polyethylene glycol powder (GLYCOLAX/MIRALAX) 17 GM/SCOOP powder, Take 1 Container by mouth daily. As needed, Disp: , Rfl:    rOPINIRole (REQUIP) 2 MG tablet, TAKE 1 TABLET BY MOUTH AT  BEDTIME, Disp: 100 tablet, Rfl: 2   rosuvastatin (CRESTOR) 10 MG tablet, TAKE 1 TABLET BY MOUTH DAILY, Disp: 100 tablet, Rfl: 2   saccharomyces boulardii (FLORASTOR) 250 MG capsule, Take 250 mg by mouth 2 (two) times daily., Disp: , Rfl:    venlafaxine (EFFEXOR) 75 MG tablet, TAKE 1 TABLET BY MOUTH DAILY, Disp: 90 tablet, Rfl: 1   amLODipine (NORVASC) 5 MG tablet,  TAKE 1 TABLET BY MOUTH DAILY, Disp: 100 tablet, Rfl: 2   oxyCODONE-acetaminophen (PERCOCET) 10-325 MG tablet, Take 1 tablet by mouth 2 (two) times daily., Disp: 60 tablet, Rfl: 0   oxyCODONE-acetaminophen (PERCOCET) 10-325 MG tablet, Take 1 tablet by mouth 2 (two) times daily., Disp: 60 tablet, Rfl: 0   oxyCODONE-acetaminophen (PERCOCET) 10-325 MG tablet, Take 1 tablet by mouth 2 (two) times daily., Disp: 60 tablet, Rfl: 0  Review of Systems:  Negative unless indicated in HPI.   Physical Exam: Vitals:   11/12/22 0707  BP: 130/70  Pulse: (!) 59  Temp: 98.4 F (36.9 C)  TempSrc: Oral  SpO2: 96%  Weight: 164 lb 9.6 oz (74.7 kg)    Body mass index is 25.03 kg/m.   Physical Exam Vitals reviewed.  Constitutional:      Appearance: Normal appearance.  HENT:     Head: Normocephalic and atraumatic.  Eyes:     Conjunctiva/sclera: Conjunctivae normal.     Pupils: Pupils are equal, round, and reactive to light.  Cardiovascular:     Rate and Rhythm: Normal rate and regular rhythm.  Pulmonary:     Effort: Pulmonary effort is normal.     Breath sounds: Normal breath sounds.  Skin:    General: Skin is warm and dry.  Neurological:     General: No focal deficit present.     Mental Status: He is alert and oriented to person, place, and time.  Psychiatric:        Mood and Affect: Mood normal.        Behavior: Behavior normal.        Thought Content: Thought content normal.        Judgment: Judgment normal.     Flowsheet Row Office Visit from 11/12/2022 in [facility] [place]  PHQ-9 Total Score 25        Impression and Plan:  Diabetes mellitus type 2, noninsulin dependent (HCC) -     POCT glycosylated hemoglobin (Hb A1C) -     Ambulatory referral to Ophthalmology  Chronic pain syndrome -     oxyCODONE-Acetaminophen; Take 1 tablet by mouth 2 (two) times daily.  Dispense: 60 tablet; Refill: 0 -     oxyCODONE-Acetaminophen; Take 1 tablet by mouth 2  (two) times daily.  Dispense: 60 tablet; Refill:  0 -     oxyCODONE-Acetaminophen; Take 1 tablet by mouth 2 (two) times daily.  Dispense: 60 tablet; Refill: 0  Vision changes -     Ambulatory referral to Ophthalmology  Bilateral hip pain -     Ambulatory referral to Orthopedics  Chronic right shoulder pain   -He believes a lot of his depression stems from his chronic pain.  He states that right now his main issues are bilateral hip and right shoulder pain.  States in the past he received injections to these areas with significant pain relief and so is requesting referral to orthopedics for the same. -PDMP reviewed, no red flags, overdose risk score is 100.  Refill oxycodone 10/325 mg to take 1 tablet twice a day for a total of 60 tablets a month x 3 months.  He is not requesting increase in oxycodone for now. -A1c of 5.6 demonstrates excellent diabetic management. -I will initiate a referral to ophthalmology for his vision changes particularly of his left eye. -Will try and call the hospital medical records to see if we can identify the issue with his missing hospital stay.  They must have created a new chart for him that I cannot access.  Time spent:32 minutes reviewing chart, interviewing and examining patient and formulating plan of care.     Chaya Jan, MD [facility]

## 2022-11-24 ENCOUNTER — Ambulatory Visit: Payer: Medicare Other | Admitting: Orthopaedic Surgery

## 2022-11-24 ENCOUNTER — Other Ambulatory Visit (INDEPENDENT_AMBULATORY_CARE_PROVIDER_SITE_OTHER): Payer: Medicare Other

## 2022-11-24 DIAGNOSIS — M25511 Pain in right shoulder: Secondary | ICD-10-CM

## 2022-11-24 DIAGNOSIS — M545 Low back pain, unspecified: Secondary | ICD-10-CM | POA: Diagnosis not present

## 2022-11-24 DIAGNOSIS — G8929 Other chronic pain: Secondary | ICD-10-CM

## 2022-11-24 MED ORDER — PREDNISONE 10 MG (21) PO TBPK: ORAL_TABLET | ORAL | 3 refills | Status: DC

## 2022-11-24 NOTE — Progress Notes (Signed)
Office Visit Note   Patient: [name]           Date of Birth: Nov 19, 1940           MRN: [id number] Visit Date: 11/24/2022              Requested by: Philip Aspen, Limmie Patricia, MD [address] [place],  [place] [address] PCP: Philip Aspen, Limmie Patricia, MD   Assessment & Plan: Visit Diagnoses:  1. Chronic bilateral low back pain without sciatica   2. Chronic right shoulder pain     Plan: [name] is a 82 year old gentleman with chronic right shoulder pain and chronic low back pain.  In regards to the back he has an established patient with Dr. Lovell Sheehan of neurosurgery.  He is on chronic oxycodone.  He has done multiple rounds of physical therapy and ESI's.  From my standpoint the best thing to do is for him to follow-up with Dr. Lovell Sheehan to discuss other options.  As this is outside of my scope of practice at this point.  From a shoulder standpoint is a fair amount of degeneration I think to the rotator cuff as well as the glenohumeral joint.  I would recommend trying a prednisone Dosepak and some physical therapy.  He would like to hold off on injection for now.  Follow-Up Instructions: No follow-ups on file.   Orders:  Orders Placed This Encounter  Procedures   XR Shoulder Right   XR Lumbar Spine 2-3 Views   Ambulatory referral to Physical Therapy   Meds ordered this encounter  Medications   predniSONE (STERAPRED UNI-PAK 21 TAB) 10 MG (21) TBPK tablet    Sig: Take as directed    Dispense:  21 tablet    Refill:  3      Procedures: No procedures performed   Clinical Data: No additional findings.   Subjective: Chief Complaint  Patient presents with   Right Hip - Pain   Right Shoulder - Pain    HPI [name] is a 82 year old gentleman comes in for chronic right shoulder pain and bilateral posterior hip and buttock pain.  Radiates down to the body.  Denies any red flag symptoms.  States he has trouble with standing more than an hour at a time.  He reports  pain to the lateral deltoid is worse with raising his arm above his head. Review of Systems  Constitutional: Negative.   HENT: Negative.    Eyes: Negative.   Respiratory: Negative.    Cardiovascular: Negative.   Gastrointestinal: Negative.   Endocrine: Negative.   Genitourinary: Negative.   Skin: Negative.   Allergic/Immunologic: Negative.   Neurological: Negative.   Hematological: Negative.   Psychiatric/Behavioral: Negative.    All other systems reviewed and are negative.    Objective: Vital Signs: There were no vitals taken for this visit.  Physical Exam Vitals and nursing note reviewed.  Constitutional:      Appearance: He is well-developed.  HENT:     Head: Normocephalic and atraumatic.  Eyes:     Pupils: Pupils are equal, round, and reactive to light.  Pulmonary:     Effort: Pulmonary effort is normal.  Abdominal:     Palpations: Abdomen is soft.  Musculoskeletal:        General: Normal range of motion.     Cervical back: Neck supple.  Skin:    General: Skin is warm.  Neurological:     Mental Status: He is alert and oriented to person,  place, and time.  Psychiatric:        Behavior: Behavior normal.        Thought Content: Thought content normal.        Judgment: Judgment normal.     Ortho Exam Exam of the lumbar spine lower extremities showed no focal motor or sensory deficits.  Decreased range of motion lumbar spine secondary to pain.  Exam of the right shoulder shows mild limitation in range of motion.  He can abduct and flex to greater than 90 degrees.  He has pain with all ranges of motion. Specialty Comments:  No specialty comments available.  Imaging: XR Lumbar Spine 2-3 Views  Result Date: 11/24/2022 X-rays of the lumbar spine show diffuse degenerative changes without any acute abnormalities.  XR Shoulder Right  Result Date: 11/24/2022 X-rays of the right shoulder showed no acute abnormalities.  Slight degenerative changes of the greater  tuberosity.    PMFS History: Patient Active Problem List   Diagnosis Date Noted   Cancer of renal pelvis, right (HCC) 06/03/2022   Bloating 04/27/2020   Gas pain 04/27/2020   Abdominal cramping 04/27/2020   Functional abdominal pain syndrome 11/12/2019   Lower extremity edema 11/12/2019   Hyperlipidemia    History of cholecystectomy 01/19/2019   Chronic diarrhea 01/19/2019   Antiplatelet or antithrombotic long-term use 01/19/2019   Non-ST elevation (NSTEMI) myocardial infarction [facility])    Acute hypoxemic respiratory failure (HCC) 01/04/2019   Multifocal pneumonia 01/04/2019   Severe sepsis (HCC) 01/04/2019   Elevated troponin 01/04/2019   Acute on chronic diastolic (congestive) heart failure (HCC) 01/04/2019   Acute respiratory failure (HCC) 01/04/2019   elevated IgG4 11/01/2018   Choledocholithiasis 10/27/2018   Dilation of biliary tract 10/27/2018   Esophageal dysphagia 10/27/2018   Upper airway cough syndrome 07/28/2018   Pleural effusion on left 07/28/2018   History of pancreatitis 05/12/2018   Abnormal LFTs 05/12/2018   Abnormal findings on esophagogastroduodenoscopy (EGD) 05/12/2018   History of ERCP 05/12/2018   Anemia 05/12/2018   Biliary stricture    ESBL (extended spectrum beta-lactamase) producing bacteria infection 04/04/2018   Ascending cholangitis 04/01/2018   Diabetes mellitus type 2, noninsulin dependent (HCC) 03/31/2018   Pancreatitis 03/31/2018   Sepsis (HCC) 03/31/2018   Bacteremia due to Gram-negative bacteria 03/31/2018   Abdominal pain    Dilated bile duct    Acute biliary pancreatitis 03/30/2018   Leukocytosis 03/30/2018   CAD (coronary artery disease) 03/30/2018   Osteoarthritis of left knee 10/18/2017   Cervical radiculopathy 10/04/2017   Pain of left hand 07/19/2017   Pre-operative cardiovascular examination 06/16/2017   CKD (chronic kidney disease), stage III 06/16/2017   Carpal tunnel syndrome 05/27/2017   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  stricture 05/10/2017   Open fracture of base of middle phalanx of finger 02/17/2017   Laceration of index finger 02/17/2017   Laceration of nail bed of finger 02/17/2017   Laceration of thumb 02/17/2017   Open fracture of distal phalanx of finger 02/17/2017   Open fractures of multiple sites of phalanx of left hand 11/24/2016   B12 deficiency 05/28/2015   GERD (gastroesophageal reflux disease) 11/16/2014   Hyperlipidemia LDL goal <70 11/21/2013   DOE (dyspnea on exertion) 11/21/2013   Other dysphagia 07/14/2013   History of esophageal stricture 07/14/2013   Hx of CABG 06/05/2013   Leg pain, bilateral 10/18/2012   Restless leg syndrome 06/21/2012   ULNAR NEUROPATHY, LEFT 08/05/2009   Urinary obstruction 10/19/2008   ACTINIC KERATOSIS 06/25/2008   DRY  EYE SYNDROME 10/13/2007   CARCINOMA, BLADDER, HX OF 07/02/2007   BARRETTS ESOPHAGUS 05/23/2007   Hypothyroidism 04/08/2007   Other malaise and fatigue 04/08/2007   Essential hypertension 10/12/2006   ANEMIA, B12 DEFICIENCY 09/08/2006   Depression 07/29/2006   NEUROPATHY, IDIOPATHIC PERIPHERAL NEC 07/29/2006   Allergic rhinitis 07/29/2006   LOW BACK PAIN 07/29/2006   COLONIC POLYPS 11/04/2000   Past Medical History:  Diagnosis Date   Allergy    Anxiety    B12 deficiency anemia    Blood transfusion without reported diagnosis    CAD (coronary artery disease)    Cancer (HCC)    bladder, Right renal cell also   Chronic kidney disease    Colon polyps    COPD (chronic obstructive pulmonary disease) (HCC)    Depression    Diabetes mellitus (HCC)    Dyspnea    Esophagus, Barrett's    GERD (gastroesophageal reflux disease)    Headache    History of bladder cancer    Bladder cancer "8 times"   History of hiatal hernia    Hyperlipidemia    Hypertension    Localized osteoarthrosis, lower leg    Myocardial infarction (HCC) 2021   Pneumonia    Restless leg syndrome    Sleep apnea    does not wear cpap   Stenosis of esophagus      Family History  Problem Relation Age of Onset   Melanoma Mother    Stroke Father    Hypertension Father    Coronary artery disease Other    Colon cancer Neg Hx    Esophageal cancer Neg Hx    Stomach cancer Neg Hx    Rectal cancer Neg Hx    Pancreatic cancer Neg Hx    Liver disease Neg Hx    Inflammatory bowel disease Neg Hx     Past Surgical History:  Procedure Laterality Date   BILIARY BRUSHING  04/01/2018   Procedure: BILIARY BRUSHING;  Surgeon: Lemar Lofty., MD;  Location: [facility] ENDOSCOPY;  Service: Gastroenterology;;   BILIARY BRUSHING  09/12/2018   Procedure: BILIARY BRUSHING;  Surgeon: Lemar Lofty., MD;  Location: [facility] ENDOSCOPY;  Service: Gastroenterology;;   BILIARY BRUSHING  11/28/2018   Procedure: BILIARY BRUSHING;  Surgeon: Lemar Lofty., MD;  Location: [facility] ENDOSCOPY;  Service: Gastroenterology;;   BILIARY BRUSHING  03/11/2020   Procedure: BILIARY BRUSHING;  Surgeon: Lemar Lofty., MD;  Location: [facility] ENDOSCOPY;  Service: Gastroenterology;;   BILIARY DILATION  09/12/2018   Procedure: BILIARY DILATION;  Surgeon: Lemar Lofty., MD;  Location: [facility] ENDOSCOPY;  Service: Gastroenterology;;   BILIARY DILATION  11/28/2018   Procedure: BILIARY DILATION;  Surgeon: Lemar Lofty., MD;  Location: [facility] ENDOSCOPY;  Service: Gastroenterology;;   BILIARY DILATION  03/11/2020   Procedure: BILIARY DILATION;  Surgeon: Lemar Lofty., MD;  Location: [facility] ENDOSCOPY;  Service: Gastroenterology;;   BILIARY STENT PLACEMENT  04/01/2018   Procedure: BILIARY STENT PLACEMENT;  Surgeon: Lemar Lofty., MD;  Location: [facility] ENDOSCOPY;  Service: Gastroenterology;;   BILIARY STENT PLACEMENT  09/12/2018   Procedure: BILIARY STENT PLACEMENT;  Surgeon: Lemar Lofty., MD;  Location: [facility] ENDOSCOPY;  Service: Gastroenterology;;   BILIARY STENT PLACEMENT  11/28/2018   Procedure: BILIARY STENT PLACEMENT;  Surgeon: Lemar Lofty., MD;  Location: [facility] ENDOSCOPY;  Service: Gastroenterology;;   BIOPSY  04/01/2018   Procedure: BIOPSY;  Surgeon: Lemar Lofty., MD;  Location: [facility] ENDOSCOPY;  Service: Gastroenterology;;   BIOPSY  09/12/2018  Procedure: BIOPSY;  Surgeon: Lemar Lofty., MD;  Location: [facility] ENDOSCOPY;  Service: Gastroenterology;;   BIOPSY  11/28/2018   Procedure: BIOPSY;  Surgeon: Lemar Lofty., MD;  Location: [facility] ENDOSCOPY;  Service: Gastroenterology;;   BIOPSY  03/11/2020   Procedure: BIOPSY;  Surgeon: Lemar Lofty., MD;  Location: [facility] ENDOSCOPY;  Service: Gastroenterology;;   bladder cancer      x 8 cystoscopy   CERVICAL DISCECTOMY     ACDF   CHOLECYSTECTOMY     COLONOSCOPY  11/17/2005   normal    CORONARY ARTERY BYPASS GRAFT     x4   CORONARY STENT INTERVENTION N/A 01/05/2019   Procedure: CORONARY STENT INTERVENTION;  Surgeon: Yvonne Kendall, MD;  Location: [facility] INVASIVE CV LAB;  Service: Cardiovascular;  Laterality: N/A;   CYSTOSCOPY WITH BIOPSY N/A 03/27/2022   Procedure: CYSTOSCOPY WITH BIOPSY;  Surgeon: Bjorn Pippin, MD;  Location: [facility] ORS;  Service: Urology;  Laterality: N/A;   CYSTOSCOPY WITH RETROGRADE PYELOGRAM, URETEROSCOPY AND STENT PLACEMENT Right 03/27/2022   Procedure: CYSTOSCOPY WITH RIGHT RETROGRADE PYELOGRAM, URETEROSCOPY AND STENT PLACEMENT urethral dilation;  Surgeon: Bjorn Pippin, MD;  Location: [facility] ORS;  Service: Urology;  Laterality: Right;   ENDOSCOPIC MUCOSAL RESECTION  09/12/2018   Procedure: ENDOSCOPIC MUCOSAL RESECTION;  Surgeon: Meridee Score Netty Starring., MD;  Location: [facility] ENDOSCOPY;  Service: Gastroenterology;;   ENDOSCOPIC RETROGRADE CHOLANGIOPANCREATOGRAPHY (ERCP) WITH PROPOFOL N/A 04/01/2018   Procedure: ENDOSCOPIC RETROGRADE CHOLANGIOPANCREATOGRAPHY (ERCP) WITH PROPOFOL;  Surgeon: Lemar Lofty., MD;  Location: [facility] ENDOSCOPY;  Service: Gastroenterology;  Laterality: N/A;   ENDOSCOPIC RETROGRADE CHOLANGIOPANCREATOGRAPHY (ERCP) WITH  PROPOFOL N/A 09/12/2018   Procedure: ENDOSCOPIC RETROGRADE CHOLANGIOPANCREATOGRAPHY (ERCP) WITH PROPOFOL;  Surgeon: Meridee Score Netty Starring., MD;  Location: [facility] ENDOSCOPY;  Service: Gastroenterology;  Laterality: N/A;   ENDOSCOPIC RETROGRADE CHOLANGIOPANCREATOGRAPHY (ERCP) WITH PROPOFOL N/A 03/11/2020   Procedure: ENDOSCOPIC RETROGRADE CHOLANGIOPANCREATOGRAPHY (ERCP) WITH PROPOFOL;  Surgeon: Meridee Score Netty Starring., MD;  Location: [facility] ENDOSCOPY;  Service: Gastroenterology;  Laterality: N/A;   ENDOSCOPIC RETROGRADE CHOLANGIOPANCREATOGRAPHY (ERCP) WITH PROPOFOL N/A 06/03/2020   Procedure: ENDOSCOPIC RETROGRADE CHOLANGIOPANCREATOGRAPHY (ERCP) WITH PROPOFOL;  Surgeon: Meridee Score Netty Starring., MD;  Location: [facility] ENDOSCOPY;  Service: Gastroenterology;  Laterality: N/A;   ERCP N/A 11/28/2018   Procedure: ENDOSCOPIC RETROGRADE CHOLANGIOPANCREATOGRAPHY (ERCP) +EGD with spyglass;  Surgeon: Meridee Score Netty Starring., MD;  Location: [facility] ENDOSCOPY;  Service: Gastroenterology;  Laterality: N/A;   ESOPHAGOGASTRODUODENOSCOPY  04/29/2010   ESOPHAGOGASTRODUODENOSCOPY (EGD) WITH PROPOFOL N/A 04/01/2018   Procedure: ESOPHAGOGASTRODUODENOSCOPY (EGD) WITH PROPOFOL;  Surgeon: Meridee Score Netty Starring., MD;  Location: [facility] ENDOSCOPY;  Service: Gastroenterology;  Laterality: N/A;   ESOPHAGOGASTRODUODENOSCOPY (EGD) WITH PROPOFOL N/A 09/12/2018   Procedure: ESOPHAGOGASTRODUODENOSCOPY (EGD) WITH PROPOFOL;  Surgeon: Meridee Score Netty Starring., MD;  Location: [facility] ENDOSCOPY;  Service: Gastroenterology;  Laterality: N/A;   ESOPHAGOGASTRODUODENOSCOPY (EGD) WITH PROPOFOL N/A 11/28/2018   Procedure: ESOPHAGOGASTRODUODENOSCOPY (EGD) WITH PROPOFOL;  Surgeon: Meridee Score Netty Starring., MD;  Location: [facility] ENDOSCOPY;  Service: Gastroenterology;  Laterality: N/A;   ESOPHAGOGASTRODUODENOSCOPY (EGD) WITH PROPOFOL N/A 03/11/2020   Procedure: ESOPHAGOGASTRODUODENOSCOPY (EGD) WITH PROPOFOL;  Surgeon: Meridee Score Netty Starring., MD;  Location: [facility] ENDOSCOPY;  Service:  Gastroenterology;  Laterality: N/A;   ESOPHAGOGASTRODUODENOSCOPY (EGD) WITH PROPOFOL N/A 06/03/2020   Procedure: ESOPHAGOGASTRODUODENOSCOPY (EGD) WITH PROPOFOL;  Surgeon: Meridee Score Netty Starring., MD;  Location: [facility] ENDOSCOPY;  Service: Gastroenterology;  Laterality: N/A;   EUS  04/01/2018   Procedure: FULL UPPER ENDOSCOPIC ULTRASOUND (EUS) RADIAL;  Surgeon: Lemar Lofty., MD;  Location: [facility] ENDOSCOPY;  Service: Gastroenterology;;   EUS N/A 09/12/2018   Procedure: UPPER ENDOSCOPIC  ULTRASOUND (EUS) RADIAL;  Surgeon: Meridee Score Netty Starring., MD;  Location: [facility] ENDOSCOPY;  Service: Gastroenterology;  Laterality: N/A;   FINE NEEDLE ASPIRATION  09/12/2018   Procedure: FINE NEEDLE ASPIRATION (FNA) LINEAR;  Surgeon: Lemar Lofty., MD;  Location: [facility] ENDOSCOPY;  Service: Gastroenterology;;   HAND SURGERY Left 2018   saw accident   HEMOSTASIS CLIP PLACEMENT  09/12/2018   Procedure: HEMOSTASIS CLIP PLACEMENT;  Surgeon: Lemar Lofty., MD;  Location: [facility] ENDOSCOPY;  Service: Gastroenterology;;   HEMOSTASIS CLIP PLACEMENT  06/03/2020   Procedure: HEMOSTASIS CLIP PLACEMENT;  Surgeon: Lemar Lofty., MD;  Location: [facility] ENDOSCOPY;  Service: Gastroenterology;;   I & D EXTREMITY Left 11/24/2016   Procedure: IRRIGATION AND DEBRIDEMENT LEFT HAND, THUMB, INDEX, MIDDLE, RING, AND SMALL FINGERS WITH RECONSTRUCTION;  Surgeon: Dominica Severin, MD;  Location: [facility] OR;  Service: Orthopedics;  Laterality: Left;   KNEE ARTHROSCOPY Left    LEFT HEART CATH AND CORS/GRAFTS ANGIOGRAPHY N/A 01/05/2019   Procedure: LEFT HEART CATH AND CORS/GRAFTS ANGIOGRAPHY;  Surgeon: Yvonne Kendall, MD;  Location: [facility] INVASIVE CV LAB;  Service: Cardiovascular;  Laterality: N/A;   LUMBAR LAMINECTOMY     and fusion x 2   NASAL SINUS SURGERY     POPLITEAL SYNOVIAL CYST EXCISION     REMOVAL OF STONES  04/01/2018   Procedure: REMOVAL OF STONES;  Surgeon: Meridee Score Netty Starring., MD;  Location: [facility] ENDOSCOPY;   Service: Gastroenterology;;   REMOVAL OF STONES  09/12/2018   Procedure: REMOVAL OF STONES;  Surgeon: Lemar Lofty., MD;  Location: [facility] ENDOSCOPY;  Service: Gastroenterology;;   REMOVAL OF STONES  11/28/2018   Procedure: REMOVAL OF STONES;  Surgeon: Lemar Lofty., MD;  Location: [facility] ENDOSCOPY;  Service: Gastroenterology;;   REMOVAL OF STONES  03/11/2020   Procedure: REMOVAL OF STONES;  Surgeon: Lemar Lofty., MD;  Location: [facility] ENDOSCOPY;  Service: Gastroenterology;;   REMOVAL OF STONES  06/03/2020   Procedure: REMOVAL OF STONES;  Surgeon: Lemar Lofty., MD;  Location: [facility] ENDOSCOPY;  Service: Gastroenterology;;   ROBOT ASSITED LAPAROSCOPIC NEPHROURETERECTOMY Right 06/03/2022   Procedure: XI ROBOT ASSITED LAPAROSCOPIC NEPHROURETERECTOMY, FLEXIBLE CYSTOSCOPY;  Surgeon: Loletta Parish., MD;  Location: [facility] ORS;  Service: Urology;  Laterality: Right;  3 HRS   SAVORY DILATION N/A 09/12/2018   Procedure: SAVORY DILATION;  Surgeon: Meridee Score Netty Starring., MD;  Location: [facility] ENDOSCOPY;  Service: Gastroenterology;  Laterality: N/A;   SAVORY DILATION N/A 11/28/2018   Procedure: SAVORY DILATION;  Surgeon: Meridee Score Netty Starring., MD;  Location: [facility] ENDOSCOPY;  Service: Gastroenterology;  Laterality: N/A;   SEPTOPLASTY Bilateral 05/26/2021   Procedure: SEPTOPLASTY;  Surgeon: Newman Pies, MD;  Location: [facility];  Service: ENT;  Laterality: Bilateral;   SPHINCTEROTOMY  04/01/2018   Procedure: Dennison Mascot;  Surgeon: Mansouraty, Netty Starring., MD;  Location: [facility] ENDOSCOPY;  Service: Gastroenterology;;   Burman Freestone CHOLANGIOSCOPY N/A 11/28/2018   Procedure: [id number] CHOLANGIOSCOPY;  Surgeon: Lemar Lofty., MD;  Location: [facility] ENDOSCOPY;  Service: Gastroenterology;  Laterality: N/A;   SPYGLASS CHOLANGIOSCOPY N/A 06/03/2020   Procedure: SPYGLASS CHOLANGIOSCOPY;  Surgeon: Lemar Lofty., MD;  Location: [facility] ENDOSCOPY;  Service: Gastroenterology;   Laterality: N/A;   STENT REMOVAL  09/12/2018   Procedure: STENT REMOVAL;  Surgeon: Lemar Lofty., MD;  Location: [facility] ENDOSCOPY;  Service: Gastroenterology;;   Francine Graven REMOVAL  11/28/2018   Procedure: STENT REMOVAL;  Surgeon: Lemar Lofty., MD;  Location: [facility] ENDOSCOPY;  Service: Gastroenterology;;   Francine Graven REMOVAL  03/11/2020  Procedure: STENT REMOVAL;  Surgeon: Lemar Lofty., MD;  Location: [facility] ENDOSCOPY;  Service: Gastroenterology;;   SUBMUCOSAL LIFTING INJECTION  09/12/2018   Procedure: SUBMUCOSAL LIFTING INJECTION;  Surgeon: Lemar Lofty., MD;  Location: [facility] ENDOSCOPY;  Service: Gastroenterology;;   Social History   Occupational History   Not on file  Tobacco Use   Smoking status: Former    Current packs/day: 0.00    Average packs/day: 0.5 packs/day for 5.0 years (2.5 ttl pk-yrs)    Types: Cigarettes    Start date: 03/31/1971    Quit date: 03/30/1976    Years since quitting: 46.6   Smokeless tobacco: Never  Vaping Use   Vaping status: Never Used  Substance and Sexual Activity   Alcohol use: No    Alcohol/week: 0.0 standard drinks of alcohol   Drug use: No   Sexual activity: Yes

## 2022-11-25 ENCOUNTER — Telehealth: Payer: Self-pay | Admitting: Orthopaedic Surgery

## 2022-11-25 NOTE — Telephone Encounter (Signed)
Tried to call patient. Number not in service. Mailed out a copy of his images to address on file.

## 2022-11-25 NOTE — Telephone Encounter (Signed)
Patient called. He would like a copy of his xray mailed to him. His cb# is [phone]

## 2022-12-01 ENCOUNTER — Encounter: Payer: Self-pay | Admitting: Cardiovascular Disease

## 2022-12-01 ENCOUNTER — Ambulatory Visit: Payer: Medicare Other | Attending: Cardiovascular Disease | Admitting: Cardiovascular Disease

## 2022-12-01 DIAGNOSIS — I358 Other nonrheumatic aortic valve disorders: Secondary | ICD-10-CM

## 2022-12-01 DIAGNOSIS — R001 Bradycardia, unspecified: Secondary | ICD-10-CM | POA: Diagnosis not present

## 2022-12-01 DIAGNOSIS — C641 Malignant neoplasm of right kidney, except renal pelvis: Secondary | ICD-10-CM

## 2022-12-01 DIAGNOSIS — I10 Essential (primary) hypertension: Secondary | ICD-10-CM | POA: Diagnosis not present

## 2022-12-01 DIAGNOSIS — I2510 Atherosclerotic heart disease of native coronary artery without angina pectoris: Secondary | ICD-10-CM

## 2022-12-01 DIAGNOSIS — E785 Hyperlipidemia, unspecified: Secondary | ICD-10-CM | POA: Diagnosis not present

## 2022-12-01 DIAGNOSIS — Z951 Presence of aortocoronary bypass graft: Secondary | ICD-10-CM | POA: Diagnosis not present

## 2022-12-01 NOTE — Patient Instructions (Signed)
Medication Instructions:  NO CHANGES *If you need a refill on your cardiac medications before your next appointment, please call your pharmacy*   Lab Work: NO CHANGES If you have labs (blood work) drawn today and your tests are completely normal, you will receive your results only by: MyChart Message (if you have MyChart) OR A paper copy in the mail If you have any lab test that is abnormal or we need to change your treatment, we will call you to review the results.   Testing/Procedures: NO TESTING   Follow-Up: At [facility], you and your health needs are our priority.  As part of our continuing mission to provide you with exceptional heart care, we have created designated Provider Care Teams.  These Care Teams include your primary Cardiologist (physician) and Advanced Practice Providers (APPs -  Physician Assistants and Nurse Practitioners) who all work together to provide you with the care you need, when you need it.  We recommend signing up for the patient portal called "MyChart".  Sign up information is provided on this After Visit Summary.  MyChart is used to connect with patients for Virtual Visits (Telemedicine).  Patients are able to view lab/test results, encounter notes, upcoming appointments, etc.  Non-urgent messages can be sent to your provider as well.   To learn more about what you can do with MyChart, go to [email or web address].    Your next appointment:   6 month(s)  Provider:   Bernadene Person, NP

## 2022-12-01 NOTE — Progress Notes (Signed)
Patient ID: [name], male   DOB: 04-29-1940, 82 y.o.   MRN: [id number]        HPI: [name] is a 82 y.o. male who presents to the office today for a 7 month cardiology followup evaluation.  [name] has known CAD and underwent CABG revascularization surgery in 2001 with a LIMA to his LAD, vein to the marginal, vein to the RCA. Catheterization in 2004 showed patent grafts. An echo Doppler study in August 2012 revealed an ejection fraction of 40-45% with moderate septal hypokinesis with grade 1 diastolic dysfunction as well as aortic valve sclerosis. Additional problems include GERD, mild emphysema, as well as obstructive sleep apnea. He no longer utilizes CPAP. He has lost approximately 25 pounds since his wife's death from a motor vehicle accident in January 2013.  He remains active and notes mild shortness of breath with activity particularly when walking up hills but this has not changed.  He also is a history of restless leg syndrome and has benefited with requip. Laboratory in 2013 demonstrated LDL 57 with LDL particle #1035, small LDL particle number was 611, slightly increased, HDLC was 35 triglycerides 93 total cholesterol 111 initial resistance course slightly elevated at 53.  BP blood work in March 2015 revealed normal renal function.  Fasting glucose was minimally increased at 106.  Total cholesterol was 116 triglycerides 80, and LDL 66, with an HDL of 34.  LDL particle number was slightly increased at 1190.  He undewent a five-year follow-up nuclear perfusion study in November 2016.  Ejection fraction was 54%.  He had normal perfusion.  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  He admits to arthralgias without myalgias.  He has issues with his in his legs and his peripheral neuropathy.  I further titrated his losartan to 75 mg and Bystolic to 10 mg.  He believes his blood pressure and pulse have been  better.  He admits to leg discomfort which seems worse with atorvastatin.  When I last saw him, he had noticed some shortness of breath with uphill walking and with exertion but denied recurrent episodes of chest tightness.   He denied PND, orthopnea.  He underwent a follow-up echo Doppler study on 10/28/2016.  Ejection fraction was excellent at 60-65%.  There was grade 2 diastolic dysfunction.  There was evidence for aortic sclerosis without stenosis.  There was mild LA dilation.  He had trivial PR.    I saw him in October 2018. He subsequently  underwent left hand surgery.  His primary care with Dr. Amador Cunas is retired.  When I last saw him in November 2019 recently, he  noticed progressive increase in shortness of breath and this seems to develop even trying to walk up very small hill.  He denied any definitive chest tightness.  He has started to run out of some of his chronic pain meds which had been prescribed by Dr. Amador Cunas.  He has not yet reestablished with another primary physician.  He was on Bystolic 10 mg and isosorbide 60 mg for his CAD and hypertension.  He was on Zetia 10 mg and rosuvastatin for hyperlipidemia.  He continues to be on chronic aspirin therapy.  He has restless legs and is on Requip.  His previous primary physician had prescribed Effexor in addition to Percocet.  In November 2019, with his change in symptomatology I recommended a follow-up echo Doppler study and a lexiscan  Myoview.  As he had had some issues with elevated potassium on ARB therapy and I started him on HCTZ other than spironolactone.  December 23, 2017 an echo Doppler study showed an EF of 60 to 65% with grade 1 diastolic dysfunction and mild aortic valve stenosis.  A Lexiscan Myoview study on January 04, 2018 was low risk and demonstrated normal perfusion without scar or ischemia.  EF was 51%.  He was hospitalized from March 11 through April 06, 2019 presenting with abdominal discomfort,.  Laboratory  revealed leukocytosis to 21,400, elevated lactic acid of 2.4, elevated lipase at 390 urinalysis notable for proteinuria and a creatinine had risen to 1.59 and there was mild elevation of AST and total bilirubin.  He was ultimately had positive blood cultures for coli to have sepsis and colitis.  An MRCP showed acute pancreatitis, mild gallbladder distention with peri-cholecystic fluid and gallbladder wall thickening.  School he underwent ERCP on April 01, 2018 which showed stricture and sludge without stone he underwent sphincterotomy and stent placement.  He is planned to be on antibiotics for several weeks.  During his hospitalization he developed acute kidney injury with peak creatinine at 2.27.  His medications were adjusted and on discharge creatinine was 1.75.    He was  evaluated by me in a telemedicine evaluation on May 19, 2018.  From a cardiac perspective he felt improved.  He was not having any leg swelling.  He denied any chest pain but continued to experience shortness of breath symptoms.  Creatinine had improved to 1.46.  He continues to have issues with back and leg discomfort and remotely had undergone steroid injections with Dr. Lovell Sheehan.  I saw him in evaluation September 2020 at which time he continued  to experience his chronic shortness of breath but that has not increased in severity.  He denies any chest pain.  He has had some issues with swallowing and continues to see GI physician.  He has undergone esophageal dilatation.  He is unaware of any rhythm disturbance.  He has been evaluated by Dr. Sherene Sires and he was referred for a lower extremity Doppler study which was negative for DVT.  He had been noted to have a left pleural effusion which is followed by Dr. Sherene Sires and is felt to have permanent pleural scarring.   He eventually underwent robotic cholecystectomy by Dr. Doylene Canard on 01/02/2019.   He has biliary stents in place for distal biliary stricture.  After his procedure, he developed a  bilateral pneumonia and shortness of breath with increasing troponin.  CT of the chest was negative for PE.  Patient was treated with IV Lasix and antibiotic. Troponin was elevated and he subsequently underwent cardiac catheterization on 01/05/2019 by Dr. Okey Dupre which showed 99% subtotally occluded SVG-RPDA/RPL.  This was treated with 3 overlapping resolute Onyx drug-eluting stents.  Postprocedure, it was recommended to continue aspirin and Plavix indefinitely and if the patient subsequently developed  refractory symptoms there was consideration for PCI of ostial left main, left circumflex and OM1.  Echocardiogram obtained on the same day showed EF 40 to 45%, severe hypokinesis of the entire inferior and inferoseptal wall, grade 1 DD, mild MR, cannot exclude ASD/PFO.   He was seen in follow-up by Azalee Course on January 19, 2019.  He continued to have fatigue however gradually improving.  He previously had a epigastric pain which he attributed to acid reflux which retrospectively may have been his angina since there was complete resolution of this discomfort following  his hospitalization.  He was walking at home multiple times throughout the day without any issue.     He was seen in a telemedicine evaluation in March 2021 and over the several months prior to that evaluation he denied any recurrent chest pain episodes.  However he admitted to feeling "rough."  He was getting physical therapy times per week.  He has been staying at home all the time.  His heart rate has been regular.  He is not aware of any palpitations.  He had not been using CPAP.   When I saw him in June 2021 he complained that he "hurts all over."  He states he has aches in his legs, back and shoulders.  Oftentimes he notices his legs hurt worse before he has to go to the bathroom.  He denied any anginal symptoms and was unaware of any cardiac arrhythmia.  He denied presyncope or syncope.    He was evaluated by Edd Fabian in October 2021 and  most recently January 2022.  He had been started on Lasix for leg swelling in October 2021 and continued to be on Bystolic, Imdur, aspirin/Plavix and statin.    I saw him on March 22, 2020 at which time he felt well.  He had had purposeful weight loss.  He has a stent in the liver a due to his prior a sending cholangitis and pancreatitis due to biliary strictures.  He is scheduled to undergo a CT of his abdomen at the end of March for further evaluation.  He denies any chest pain or anginal symptomatology.  Breathing is better.  He admits to right shoulder discomfort.  During that evaluation, his blood pressure was stable amlodipine 5 mg, isosorbide 60 mg, and Bystolic 10 mg.  I saw him on December 05, 2020.  At that time he felt well from a cardiac standpoint.  He was continuing to have some pain in his shoulder particularly when raising his arms.   At times he developed some nausea following eating.  He will need dental extraction of approximately 10 teeth.  His blood pressure has been stable at home.    When I saw him on November 19, 2020 he felt well from a cardiac standpoint.   He has been having continued difficulty with arthritis involving his neck, back, and shoulders.  He also has had difficulty swallowing solids and is scheduled to see follow-up GI.  In the past he has required several esophageal dilatations.  He continues to be on amlodipine at low-dose 2.5 mg, furosemide which he now takes as needed for swelling, isosorbide 60 mg daily, and nebivolol 10 mg daily for blood pressure and CAD.  He is on rosuvastatin 10 mg for hyperlipidemia.  He is on pantoprazole for GERD.  He continues to be on Effexor to help with some depression.  He denies presyncope or syncope or awareness of palpitations.    I saw him on November 19, 2021 and last saw him on April 28, 2022.[name]  He continues to be followed by Dr. Philip Aspen for primary care he sees Dr. Bjorn Pippin for urology and recently underwent cystoscopy  with right retrograde pyelogram uteroscopy and stent placement on May 27, 2022.  He denies any chest pain.  He does experience shortness of breath with walking.  He is scheduled to undergo robot-assisted laparoscopic nephro ureterectomy by Dr. Sebastian Ache at [facility] on Jun 03, 2022 for renal cell CA.  He admits to trace edema.  He  now sees Dr. Shirline Frees for oncology.  He continues to be on amlodipine 5 mg, Plavix 75 mg without aspirin, nebivolol 10 mg, isosorbide 60 mg, and is on Zetia 10 mg and rosuvastatin 10 mg for hyperlipidemia.  He is on Effexor for depression.    He underwent echo Doppler study on May 12, 2022.  EF was 50 to 55% without regional wall motion abnormalities.  There was mild aortic sclerosis without stenosis.  There is very mild dilation of ascending aorta at 39 mm.  On May 18, 2022 he underwent a pharmacologic Lexiscan Myoview study which showed findings consistent with a fixed perfusion defect in the inferior basal to mid wall consistent with scar.  There was no ischemia.  The defect appeared worse at rest without specific wall motion abnormalities and an artifact component was raised.  He was evaluated by Bernadene Person, NP on May 27, 2022 for preoperative evaluation and was given clearance and advised to hold Plavix for 5 days prior to surgery.  He underwent robotic assisted laparoscopic right nephro ureterectomy and treatment of high-grade upper pole renal cancer by Dr. Berneice Heinrich on May 17. 2024.   Presently, he feels well.  He denies chest pain or shortness of breath.  He continues to be on amlodipine 5 mg, isosorbide 60 mg, and nebivolol 10 mg for hypertension and CAD.  He is back on aspirin/Plavix.  He is on Zetia 10 mg in addition to rosuvastatin 10 mg for hyperlipidemia.  He continues to be on Effexor 75 mg daily.  He continues to see Dr. Philip Aspen and has an appointment in January for follow-up evaluation.  Past Medical History:  Diagnosis Date   Allergy     Anxiety    B12 deficiency anemia    Blood transfusion without reported diagnosis    CAD (coronary artery disease)    Cancer (HCC)    bladder, Right renal cell also   Chronic kidney disease    Colon polyps    COPD (chronic obstructive pulmonary disease) (HCC)    Depression    Diabetes mellitus (HCC)    Dyspnea    Esophagus, Barrett's    GERD (gastroesophageal reflux disease)    Headache    History of bladder cancer    Bladder cancer "8 times"   History of hiatal hernia    Hyperlipidemia    Hypertension    Localized osteoarthrosis, lower leg    Myocardial infarction (HCC) 2021   Pneumonia    Restless leg syndrome    Sleep apnea    does not wear cpap   Stenosis of esophagus     Past Surgical History:  Procedure Laterality Date   BILIARY BRUSHING  04/01/2018   Procedure: BILIARY BRUSHING;  Surgeon: Lemar Lofty., MD;  Location: [facility] ENDOSCOPY;  Service: Gastroenterology;;   BILIARY BRUSHING  09/12/2018   Procedure: BILIARY BRUSHING;  Surgeon: Lemar Lofty., MD;  Location: [facility] ENDOSCOPY;  Service: Gastroenterology;;   BILIARY BRUSHING  11/28/2018   Procedure: BILIARY BRUSHING;  Surgeon: Lemar Lofty., MD;  Location: [facility] ENDOSCOPY;  Service: Gastroenterology;;   BILIARY BRUSHING  03/11/2020   Procedure: BILIARY BRUSHING;  Surgeon: Lemar Lofty., MD;  Location: [facility] ENDOSCOPY;  Service: Gastroenterology;;   BILIARY DILATION  09/12/2018   Procedure: BILIARY DILATION;  Surgeon: Lemar Lofty., MD;  Location: [facility] ENDOSCOPY;  Service: Gastroenterology;;   BILIARY DILATION  11/28/2018   Procedure: BILIARY DILATION;  Surgeon: Lemar Lofty., MD;  Location: [facility] ENDOSCOPY;  Service: Gastroenterology;;  BILIARY DILATION  03/11/2020   Procedure: BILIARY DILATION;  Surgeon: Mansouraty, Netty Starring., MD;  Location: [facility] ENDOSCOPY;  Service: Gastroenterology;;   BILIARY STENT PLACEMENT  04/01/2018   Procedure: BILIARY STENT PLACEMENT;   Surgeon: Lemar Lofty., MD;  Location: [facility] ENDOSCOPY;  Service: Gastroenterology;;   BILIARY STENT PLACEMENT  09/12/2018   Procedure: BILIARY STENT PLACEMENT;  Surgeon: Lemar Lofty., MD;  Location: [facility] ENDOSCOPY;  Service: Gastroenterology;;   BILIARY STENT PLACEMENT  11/28/2018   Procedure: BILIARY STENT PLACEMENT;  Surgeon: Lemar Lofty., MD;  Location: [facility] ENDOSCOPY;  Service: Gastroenterology;;   BIOPSY  04/01/2018   Procedure: BIOPSY;  Surgeon: Lemar Lofty., MD;  Location: [facility] ENDOSCOPY;  Service: Gastroenterology;;   BIOPSY  09/12/2018   Procedure: BIOPSY;  Surgeon: Lemar Lofty., MD;  Location: [facility] ENDOSCOPY;  Service: Gastroenterology;;   BIOPSY  11/28/2018   Procedure: BIOPSY;  Surgeon: Lemar Lofty., MD;  Location: [facility] ENDOSCOPY;  Service: Gastroenterology;;   BIOPSY  03/11/2020   Procedure: BIOPSY;  Surgeon: Lemar Lofty., MD;  Location: [facility] ENDOSCOPY;  Service: Gastroenterology;;   bladder cancer      x 8 cystoscopy   CERVICAL DISCECTOMY     ACDF   CHOLECYSTECTOMY     COLONOSCOPY  11/17/2005   normal    CORONARY ARTERY BYPASS GRAFT     x4   CORONARY STENT INTERVENTION N/A 01/05/2019   Procedure: CORONARY STENT INTERVENTION;  Surgeon: Yvonne Kendall, MD;  Location: [facility] INVASIVE CV LAB;  Service: Cardiovascular;  Laterality: N/A;   CYSTOSCOPY WITH BIOPSY N/A 03/27/2022   Procedure: CYSTOSCOPY WITH BIOPSY;  Surgeon: Bjorn Pippin, MD;  Location: [facility] ORS;  Service: Urology;  Laterality: N/A;   CYSTOSCOPY WITH RETROGRADE PYELOGRAM, URETEROSCOPY AND STENT PLACEMENT Right 03/27/2022   Procedure: CYSTOSCOPY WITH RIGHT RETROGRADE PYELOGRAM, URETEROSCOPY AND STENT PLACEMENT urethral dilation;  Surgeon: Bjorn Pippin, MD;  Location: [facility] ORS;  Service: Urology;  Laterality: Right;   ENDOSCOPIC MUCOSAL RESECTION  09/12/2018   Procedure: ENDOSCOPIC MUCOSAL RESECTION;  Surgeon: Meridee Score Netty Starring., MD;  Location: [facility] ENDOSCOPY;   Service: Gastroenterology;;   ENDOSCOPIC RETROGRADE CHOLANGIOPANCREATOGRAPHY (ERCP) WITH PROPOFOL N/A 04/01/2018   Procedure: ENDOSCOPIC RETROGRADE CHOLANGIOPANCREATOGRAPHY (ERCP) WITH PROPOFOL;  Surgeon: Lemar Lofty., MD;  Location: [facility] ENDOSCOPY;  Service: Gastroenterology;  Laterality: N/A;   ENDOSCOPIC RETROGRADE CHOLANGIOPANCREATOGRAPHY (ERCP) WITH PROPOFOL N/A 09/12/2018   Procedure: ENDOSCOPIC RETROGRADE CHOLANGIOPANCREATOGRAPHY (ERCP) WITH PROPOFOL;  Surgeon: Meridee Score Netty Starring., MD;  Location: [facility] ENDOSCOPY;  Service: Gastroenterology;  Laterality: N/A;   ENDOSCOPIC RETROGRADE CHOLANGIOPANCREATOGRAPHY (ERCP) WITH PROPOFOL N/A 03/11/2020   Procedure: ENDOSCOPIC RETROGRADE CHOLANGIOPANCREATOGRAPHY (ERCP) WITH PROPOFOL;  Surgeon: Meridee Score Netty Starring., MD;  Location: [facility] ENDOSCOPY;  Service: Gastroenterology;  Laterality: N/A;   ENDOSCOPIC RETROGRADE CHOLANGIOPANCREATOGRAPHY (ERCP) WITH PROPOFOL N/A 06/03/2020   Procedure: ENDOSCOPIC RETROGRADE CHOLANGIOPANCREATOGRAPHY (ERCP) WITH PROPOFOL;  Surgeon: Meridee Score Netty Starring., MD;  Location: [facility] ENDOSCOPY;  Service: Gastroenterology;  Laterality: N/A;   ERCP N/A 11/28/2018   Procedure: ENDOSCOPIC RETROGRADE CHOLANGIOPANCREATOGRAPHY (ERCP) +EGD with spyglass;  Surgeon: Meridee Score Netty Starring., MD;  Location: [facility] ENDOSCOPY;  Service: Gastroenterology;  Laterality: N/A;   ESOPHAGOGASTRODUODENOSCOPY  04/29/2010   ESOPHAGOGASTRODUODENOSCOPY (EGD) WITH PROPOFOL N/A 04/01/2018   Procedure: ESOPHAGOGASTRODUODENOSCOPY (EGD) WITH PROPOFOL;  Surgeon: Meridee Score Netty Starring., MD;  Location: [facility] ENDOSCOPY;  Service: Gastroenterology;  Laterality: N/A;   ESOPHAGOGASTRODUODENOSCOPY (EGD) WITH PROPOFOL N/A 09/12/2018   Procedure: ESOPHAGOGASTRODUODENOSCOPY (EGD) WITH PROPOFOL;  Surgeon: Meridee Score Netty Starring., MD;  Location: [facility] ENDOSCOPY;  Service: Gastroenterology;  Laterality: N/A;  ESOPHAGOGASTRODUODENOSCOPY (EGD) WITH PROPOFOL N/A 11/28/2018    Procedure: ESOPHAGOGASTRODUODENOSCOPY (EGD) WITH PROPOFOL;  Surgeon: Meridee Score Netty Starring., MD;  Location: [facility] ENDOSCOPY;  Service: Gastroenterology;  Laterality: N/A;   ESOPHAGOGASTRODUODENOSCOPY (EGD) WITH PROPOFOL N/A 03/11/2020   Procedure: ESOPHAGOGASTRODUODENOSCOPY (EGD) WITH PROPOFOL;  Surgeon: Meridee Score Netty Starring., MD;  Location: [facility] ENDOSCOPY;  Service: Gastroenterology;  Laterality: N/A;   ESOPHAGOGASTRODUODENOSCOPY (EGD) WITH PROPOFOL N/A 06/03/2020   Procedure: ESOPHAGOGASTRODUODENOSCOPY (EGD) WITH PROPOFOL;  Surgeon: Meridee Score Netty Starring., MD;  Location: [facility] ENDOSCOPY;  Service: Gastroenterology;  Laterality: N/A;   EUS  04/01/2018   Procedure: FULL UPPER ENDOSCOPIC ULTRASOUND (EUS) RADIAL;  Surgeon: Lemar Lofty., MD;  Location: [facility] ENDOSCOPY;  Service: Gastroenterology;;   EUS N/A 09/12/2018   Procedure: UPPER ENDOSCOPIC ULTRASOUND (EUS) RADIAL;  Surgeon: Lemar Lofty., MD;  Location: [facility] ENDOSCOPY;  Service: Gastroenterology;  Laterality: N/A;   FINE NEEDLE ASPIRATION  09/12/2018   Procedure: FINE NEEDLE ASPIRATION (FNA) LINEAR;  Surgeon: Lemar Lofty., MD;  Location: [facility] ENDOSCOPY;  Service: Gastroenterology;;   HAND SURGERY Left 2018   saw accident   HEMOSTASIS CLIP PLACEMENT  09/12/2018   Procedure: HEMOSTASIS CLIP PLACEMENT;  Surgeon: Lemar Lofty., MD;  Location: [facility] ENDOSCOPY;  Service: Gastroenterology;;   HEMOSTASIS CLIP PLACEMENT  06/03/2020   Procedure: HEMOSTASIS CLIP PLACEMENT;  Surgeon: Lemar Lofty., MD;  Location: [facility] ENDOSCOPY;  Service: Gastroenterology;;   I & D EXTREMITY Left 11/24/2016   Procedure: IRRIGATION AND DEBRIDEMENT LEFT HAND, THUMB, INDEX, MIDDLE, RING, AND SMALL FINGERS WITH RECONSTRUCTION;  Surgeon: Dominica Severin, MD;  Location: [facility] OR;  Service: Orthopedics;  Laterality: Left;   KNEE ARTHROSCOPY Left    LEFT HEART CATH AND CORS/GRAFTS ANGIOGRAPHY N/A 01/05/2019   Procedure: LEFT HEART CATH AND  CORS/GRAFTS ANGIOGRAPHY;  Surgeon: Yvonne Kendall, MD;  Location: [facility] INVASIVE CV LAB;  Service: Cardiovascular;  Laterality: N/A;   LUMBAR LAMINECTOMY     and fusion x 2   NASAL SINUS SURGERY     POPLITEAL SYNOVIAL CYST EXCISION     REMOVAL OF STONES  04/01/2018   Procedure: REMOVAL OF STONES;  Surgeon: Meridee Score Netty Starring., MD;  Location: [facility] ENDOSCOPY;  Service: Gastroenterology;;   REMOVAL OF STONES  09/12/2018   Procedure: REMOVAL OF STONES;  Surgeon: Lemar Lofty., MD;  Location: [facility] ENDOSCOPY;  Service: Gastroenterology;;   REMOVAL OF STONES  11/28/2018   Procedure: REMOVAL OF STONES;  Surgeon: Lemar Lofty., MD;  Location: [facility] ENDOSCOPY;  Service: Gastroenterology;;   REMOVAL OF STONES  03/11/2020   Procedure: REMOVAL OF STONES;  Surgeon: Lemar Lofty., MD;  Location: [facility] ENDOSCOPY;  Service: Gastroenterology;;   REMOVAL OF STONES  06/03/2020   Procedure: REMOVAL OF STONES;  Surgeon: Lemar Lofty., MD;  Location: [facility] ENDOSCOPY;  Service: Gastroenterology;;   ROBOT ASSITED LAPAROSCOPIC NEPHROURETERECTOMY Right 06/03/2022   Procedure: XI ROBOT ASSITED LAPAROSCOPIC NEPHROURETERECTOMY, FLEXIBLE CYSTOSCOPY;  Surgeon: Loletta Parish., MD;  Location: [facility] ORS;  Service: Urology;  Laterality: Right;  3 HRS   SAVORY DILATION N/A 09/12/2018   Procedure: SAVORY DILATION;  Surgeon: Meridee Score Netty Starring., MD;  Location: [facility] ENDOSCOPY;  Service: Gastroenterology;  Laterality: N/A;   SAVORY DILATION N/A 11/28/2018   Procedure: SAVORY DILATION;  Surgeon: Meridee Score Netty Starring., MD;  Location: [facility] ENDOSCOPY;  Service: Gastroenterology;  Laterality: N/A;   SEPTOPLASTY Bilateral 05/26/2021   Procedure: SEPTOPLASTY;  Surgeon: Newman Pies, MD;  Location: [facility];  Service: ENT;  Laterality: Bilateral;   SPHINCTEROTOMY  04/01/2018   Procedure: SPHINCTEROTOMY;  Surgeon: Mansouraty, Netty Starring., MD;  Location: [facility] ENDOSCOPY;  Service:  Gastroenterology;;   Burman Freestone CHOLANGIOSCOPY N/A 11/28/2018   Procedure: ZOXWRUEA CHOLANGIOSCOPY;  Surgeon: Lemar Lofty., MD;  Location: [facility] ENDOSCOPY;  Service: Gastroenterology;  Laterality: N/A;   SPYGLASS CHOLANGIOSCOPY N/A 06/03/2020   Procedure: SPYGLASS CHOLANGIOSCOPY;  Surgeon: Lemar Lofty., MD;  Location: [facility] ENDOSCOPY;  Service: Gastroenterology;  Laterality: N/A;   STENT REMOVAL  09/12/2018   Procedure: STENT REMOVAL;  Surgeon: Lemar Lofty., MD;  Location: [facility] ENDOSCOPY;  Service: Gastroenterology;;   Francine Graven REMOVAL  11/28/2018   Procedure: STENT REMOVAL;  Surgeon: Lemar Lofty., MD;  Location: [facility] ENDOSCOPY;  Service: Gastroenterology;;   Francine Graven REMOVAL  03/11/2020   Procedure: STENT REMOVAL;  Surgeon: Lemar Lofty., MD;  Location: [facility] ENDOSCOPY;  Service: Gastroenterology;;   SUBMUCOSAL LIFTING INJECTION  09/12/2018   Procedure: SUBMUCOSAL LIFTING INJECTION;  Surgeon: Lemar Lofty., MD;  Location: [facility] ENDOSCOPY;  Service: Gastroenterology;;    No Known Allergies   Current Outpatient Medications  Medication Sig Dispense Refill   acetaminophen (TYLENOL) 500 MG tablet Take 1,000 mg by mouth every 6 (six) hours as needed for moderate pain.     amLODipine (NORVASC) 5 MG tablet TAKE 1 TABLET BY MOUTH DAILY 100 tablet 2   aspirin EC 81 MG tablet Take 81 mg by mouth every evening. Swallow whole.     cetirizine (ZYRTEC) 10 MG tablet Take 10 mg by mouth daily as needed for allergies.     clopidogrel (PLAVIX) 75 MG tablet TAKE 1 TABLET BY MOUTH ONCE  DAILY 90 tablet 2   Coenzyme Q10 200 MG capsule Take 200 mg by mouth daily.     cyanocobalamin (VITAMIN B12) 1000 MCG/ML injection Inject 1,000 mcg into the skin every 30 (thirty) days.     ezetimibe (ZETIA) 10 MG tablet TAKE 1 TABLET BY MOUTH DAILY 100 tablet 0   Fe Bisgly-Vit C-Vit B12-FA (GENTLE IRON PO) Take by mouth. daily     fluticasone (FLONASE) 50 MCG/ACT nasal spray Place 1  spray into both nostrils daily as needed for allergies.     isosorbide mononitrate (IMDUR) 60 MG 24 hr tablet TAKE 1 TABLET BY MOUTH DAILY 90 tablet 2   Menthol, Topical Analgesic, (BENGAY EX) Apply 1 application topically daily as needed (pain).     METAMUCIL FIBER PO Take 1 capsule by mouth in the morning.     Multiple Vitamin (MULTIVITAMIN) tablet Take 1 tablet by mouth daily.     Naphazoline-Pheniramine (OPCON-A) 0.027-0.315 % SOLN Place 1 drop into both eyes daily as needed (itching eyes).     nebivolol (BYSTOLIC) 10 MG tablet TAKE 1 TABLET BY MOUTH DAILY 100 tablet 3   NEEDLE, DISP, 25 G (B-D DISP NEEDLE 25GX1") 25G X 1" MISC Inject 1000 mcg into muscle once a month. 50 each 0   nitroGLYCERIN (NITROSTAT) 0.4 MG SL tablet DISSOLVE ONE TABLET UNDER THE TONGUE EVERY 5 MINUTES AS NEEDED FOR CHEST PAIN. 25 tablet 0   oxyCODONE-acetaminophen (PERCOCET) 10-325 MG tablet Take 1 tablet by mouth 2 (two) times daily. 60 tablet 0   oxyCODONE-acetaminophen (PERCOCET) 10-325 MG tablet Take 1 tablet by mouth 2 (two) times daily. 60 tablet 0   oxyCODONE-acetaminophen (PERCOCET) 10-325 MG tablet Take 1 tablet by mouth 2 (two) times daily. 60 tablet 0   pantoprazole (PROTONIX) 40 MG tablet TAKE 1 TABLET BY MOUTH ONCE  DAILY 100 tablet 2   polyethylene glycol powder (  GLYCOLAX/MIRALAX) 17 GM/SCOOP powder Take 1 Container by mouth daily. As needed     predniSONE (STERAPRED UNI-PAK 21 TAB) 10 MG (21) TBPK tablet Take as directed 21 tablet 3   rOPINIRole (REQUIP) 2 MG tablet TAKE 1 TABLET BY MOUTH AT  BEDTIME 100 tablet 2   rosuvastatin (CRESTOR) 10 MG tablet TAKE 1 TABLET BY MOUTH DAILY 100 tablet 2   saccharomyces boulardii (FLORASTOR) 250 MG capsule Take 250 mg by mouth 2 (two) times daily.     venlafaxine (EFFEXOR) 75 MG tablet TAKE 1 TABLET BY MOUTH DAILY 90 tablet 1   No current facility-administered medications for this visit.   Socially he is widowed in January 2013. He has 3 children 6 grandchildren.  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 There is no recent tobacco use.  no alcohol.  His daughter is the wife of my patient [name].  ROS General: Negative; No fevers, chills, or night sweats;  HEENT: Negative; No changes in vision or hearing, sinus congestion, difficulty swallowing Pulmonary: Negative; No cough, wheezing, shortness of breath, hemoptysis Cardiovascular: Negative; No chest pain, presyncope, syncope, palpatations.  Minimal shortness of breath with activity GI: GERD; recent pancreatitis and septic cholangitis GU: Recent acute kidney injury; urothelial carcinoma of the right kidney Musculoskeletal: Positive for multiple joint aches involving his legs, hips, back, neck and shoulder Hematologic/Oncology: Negative; no easy bruising, bleeding Endocrine: Negative; no heat/cold intolerance; no diabetes Neuro: Negative; no changes in balance, headaches Skin: Negative; No rashes or skin lesions Psychiatric: Negative; No behavioral problems, depression Sleep: He no longer uses CPAP with his 25 pound weight loss.  His restless legs improved with low-dose Requip; No snoring, daytime sleepiness, hypersomnolence, bruxism, hypnogognic hallucinations, no cataplexy Other comprehensive 14 point system review is negative.   PE BP (!) 140/62 (BP Location: Left Arm, Patient Position: Sitting, Cuff Size: Normal)   Pulse 61   Ht 5\' 9"  (1.753 m)   Wt 160 lb 9.6 oz (72.8 kg)   SpO2 93%   BMI 23.72 kg/m    Repeat blood pressure by me was 136/70  Wt Readings from Last 3 Encounters:  12/01/22 160 lb 9.6 oz (72.8 kg)  11/12/22 164 lb 9.6 oz (74.7 kg)  06/03/22 171 lb 4.8 oz (77.7 kg)    General: Alert, oriented, no distress.  Skin: normal turgor, no rashes, warm and dry HEENT: Normocephalic, atraumatic. Pupils equal round and reactive to light; sclera anicteric; extraocular muscles intact; Fundi  ** Nose without nasal septal hypertrophy Mouth/Parynx benign; Mallinpatti scale Neck: No JVD, no carotid bruits; normal carotid upstroke Lungs: clear to ausculatation and percussion; no wheezing or rales Chest wall: without tenderness to palpitation Heart: PMI not displaced, RRR, s1 s2 normal, 1/6 systolic murmur, no diastolic murmur, no rubs, gallops, thrills, or heaves Abdomen: soft, nontender; no hepatosplenomehaly, BS+; abdominal aorta nontender and not dilated by palpation. Back: no CVA tenderness Pulses 2+ Musculoskeletal: full range of motion, normal strength, no joint deformities Extremities: no clubbing cyanosis or edema, Homan's sign negative  Neurologic: grossly nonfocal; Cranial nerves grossly wnl Psychologic: Normal mood and affect    EKG Interpretation Date/Time:  Tuesday December 01 2022 08:07:59 EST Ventricular Rate:  61 PR Interval:  140 QRS Duration:  86 QT Interval:  408 QTC Calculation: 410 R Axis:   17  Text Interpretation: Normal sinus rhythm Normal ECG When compared with ECG of 06-Jan-2019 04:34,  Criteria for Inferior infarct are no longer Present ST no longer depressed in Anterior leads Nonspecific T wave abnormality no longer evident in Lateral leads Confirmed by Nicki Guadalajara ([id number]) on 12/01/2022 8:26:21 AM    April 28, 2022  ECG (independently read by me): NSR at 65  November 19, 2021 ECG (independently read by me):  Sinus bradycardia at 59    December 05, 2020 ECG (independently read by me): Sinus bradycardia at 59, no ectopy   March 22, 2020 ECG (independently read by me): Sinus bradycardia at 59; no ectopy, normal intervals  June 2021 ECG (independently read by me): Sinus bradycardia 58 bpm.  Q wave in lead III.  QTc interval 426 ms.  No ectopy.  October 10, 2018 ECG (independently read by me): Sinus rhythm with mild sinus arrhythmia at 71 bpm.  QTc interval 465 ms.  No significant ST changes.  No ectopy.  December 13, 2017 ECG (independently read  by me): NSR at 64; IRBBB; normal intervals  July 2018 ECG (independently read by me): Sinus bradycardia 53 bpm.  No ectopy.  Normal intervals.  No ST segment changes.  December 2017 ECG (independently read by me): Normal sinus rhythm at 74 bpm.  No ectopy.  Normal intervals.  No ST segment changes.  October 2016 ECG (independently read by me): Normal sinus rhythm at 61 bpm.  No ectopy.  Normal intervals.  May 2016 ECG (independently read by me): Sinus bradycardia 56 bpm.  No ectopy.  Mild RV conduction delay.  QTc interval 395  ECG (independently read by me): sinus bradycardia 56 bpm.  Mild RV conduction delay.  Borderline first-degree AV block with a PR interval at 208 ms.  No ectopy.  Prior 06/05/2013 ECG (independently read by me): Normal sinus rhythm at 63 beats per minute.  No ectopy; normal intervals.  Prior November 2014 ECG: Sinus rhythm at 68beats per minute. There is mild RV conduction delay. Intervals were normal.  LABS:     Latest Ref Rng & Units 06/04/2022    5:03 AM 06/03/2022    3:39 PM 06/02/2022   10:00 AM  BMP  Glucose 70 - 99 mg/dL 604  540  981   BUN 8 - 23 mg/dL 32  26  20   Creatinine 0.61 - 1.24 mg/dL 1.91  4.78  2.95   Sodium 135 - 145 mmol/L 133  139  142   Potassium 3.5 - 5.1 mmol/L 4.0  4.5  4.6   Chloride 98 - 111 mmol/L 100  105  104   CO2 22 - 32 mmol/L 26  24  31    Calcium 8.9 - 10.3 mg/dL 7.8  8.5  9.2       Latest Ref Rng & Units 06/02/2022   10:00 AM 05/22/2022    9:34 AM 04/29/2022    1:30 PM  Hepatic Function  Total Protein 6.0 - 8.3 g/dL 6.8  7.3  6.9   Albumin 3.5 - 5.2 g/dL 3.9  3.8  3.8   AST 0 - 37 U/L 16  18  16    ALT 0 - 53 U/L 7  11  7    Alk Phosphatase 39 - 117 U/L 58  56  57   Total Bilirubin 0.2 - 1.2 mg/dL 0.5  0.6  0.3        Latest Ref Rng & Units 06/04/2022    5:03 AM 06/03/2022    3:39 PM 06/02/2022   10:00 AM  CBC  WBC 4.0 - 10.5  K/uL   6.3   Hemoglobin 13.0 - 17.0 g/dL 16.1  09.6  04.5   Hematocrit 39.0 - 52.0 % 32.1   38.0  39.3   Platelets 150.0 - 400.0 K/uL   158.0      Lipid Panel     Component Value Date/Time   CHOL 121 06/02/2022 1000   CHOL 118 12/09/2020 0853   CHOL 116 03/30/2013 1053   TRIG 123.0 06/02/2022 1000   TRIG 80 03/30/2013 1053   HDL 35.60 (L) 06/02/2022 1000   HDL 36 (L) 12/09/2020 0853   HDL 34 (L) 03/30/2013 1053   CHOLHDL 3 06/02/2022 1000   VLDL 24.6 06/02/2022 1000   LDLCALC 61 06/02/2022 1000   LDLCALC 62 12/09/2020 0853   LDLCALC 66 03/30/2013 1053     RADIOLOGY: No results found.  ECHO 04/10/2119 IMPRESSIONS   1. Left ventricular ejection fraction, by estimation, is 50 to 55%. The  left ventricle has low normal function. The left ventricle has no regional  wall motion abnormalities. Left ventricular diastolic parameters are  consistent with Grade II diastolic  dysfunction (pseudonormalization).   2. Right ventricular systolic function is normal. The right ventricular  size is normal. There is moderately elevated pulmonary artery systolic  pressure.   3. Left atrial size was moderately dilated.   4. The mitral valve is normal in structure. Trivial mitral valve  regurgitation. No evidence of mitral stenosis.   5. The aortic valve is grossly normal. Aortic valve regurgitation is  trivial. No aortic stenosis is present.   ECHO: 05/12/2022  1. Left ventricular ejection fraction, by estimation, is 50 to 55%. The  left ventricle has low normal function. The left ventricle has no regional  wall motion abnormalities. Left ventricular diastolic parameters were  normal.   2. Right ventricular systolic function is normal. The right ventricular  size is normal.   3. Left atrial size was moderately dilated.   4. The mitral valve is abnormal. Mild mitral valve regurgitation. No  evidence of mitral stenosis.   5. The aortic valve is tricuspid. There is mild calcification of the  aortic valve. There is mild thickening of the aortic valve. Aortic valve  regurgitation  is trivial. Aortic valve sclerosis is present, with no  evidence of aortic valve stenosis.   6. Aortic dilatation noted. There is mild dilatation of the ascending  aorta, measuring 39 mm.   7. The inferior vena cava is normal in size with greater than 50%  respiratory variability, suggesting right atrial pressure of 3 mmHg.   LEXISCAN MYOVIEW: 05/14/2022   Findings are consistent with infarction. The study is intermediate risk.   No ST deviation was noted.   Left ventricular function is normal. Nuclear stress EF: 56 %. The left ventricular ejection fraction is normal (55-65%). End diastolic cavity size is normal.   Prior study available for comparison from 01/04/2018.   Moderate size and intensity fixed perfusion defect in the inferior wall basal-mid c/f scar. However, appears worse at rest and there's no specific wall motion abnormalities, may have an artifact component. No inducible ischemia  IMPRESSION:  1. Coronary artery disease involving native coronary artery of native heart without angina pectoris   2. Hx of CABG   3. Essential hypertension   4. Hyperlipidemia LDL goal <70   5. Mild aortic valve sclerosis   6. Renal cancer, right [facility])     ASSESSMENT AND PLAN:  Mr. Kuehler is a 82 year old Caucasian male who has  known CAD and is 23 years status post  CABG revascularization surgery in 2001. His catheterization in 2004 for recurrent chest pain showed patent grafts. His ejection fraction of 40-45% was documented in August 2012.  In October 2018 an echo revealed an EF of 60 to 65% with grade 2 diastolic dysfunction, aortic valve sclerosis without stenosis mild left atrial dilation.  An echo in December 2019 continued to show an EF of 60 to 65%.  There was now grade 1 diastolic dysfunction.  There was mild aortic stenosis. A Lexiscan Myoview study on January 04, 2018 was low risk and showed an EF of 51% without ischemia.  He developed  pancreatitis and felt to have acsending cholangitis  with E. coli sepsis.  He underwent sphincterotomy and stent placement in his bile duct by Dr. Corliss Parish.  He continued to have issues with significant shortness of breath which is stable but chronic.  His last catheterization from January 05, 2019  showed a 99% subtotally occluded vein graft to his right PDA/PL vessel which was treated successfully with 3 overlapping Resolute Onyx DES stents.  It was recommended that he continue aspirin/Plavix indefinitely.  An echo Doppler study in March 2021 showed low normal LV function with EF 50 to 55% without wall motion abnormalities, grade 2 diastolic dysfunction and moderate left atrial dilatation.  Prior to undergoing recent robotic assisted laparoscopic nephroureterectomy by Dr. Kathrynn Running, a preoperative echo in April 2024 showed EF at 50 to 55% without regional wall motion abnormality.  A nuclear perfusion study showed normal wall motion and raise concern for possible small fixed inferior defect but imaging was actually improved with stress compared to rest raising concern for possible artifact.  Clinically he is doing well.  Blood pressure today is stable on his current regimen of amlodipine isosorbide and nebivolol.  He is not having anginal symptomatology.  He continues to be on Zetia and rosuvastatin for hyperlipidemia.  He is followed by Dr. Philip Aspen and has scheduled follow-up appointment in January.  Clinically, he is stable.  I will see him in 6 months for reevaluation or sooner as needed.  Jacob Bihari, MD, [facility]  12/06/2022 9:31 AM

## 2022-12-06 ENCOUNTER — Encounter: Payer: Self-pay | Admitting: Cardiovascular Disease

## 2022-12-10 DIAGNOSIS — Z905 Acquired absence of kidney: Secondary | ICD-10-CM | POA: Diagnosis not present

## 2022-12-14 ENCOUNTER — Encounter: Payer: Self-pay | Admitting: Gastroenterology

## 2022-12-24 DIAGNOSIS — C678 Malignant neoplasm of overlapping sites of bladder: Secondary | ICD-10-CM | POA: Diagnosis not present

## 2022-12-24 DIAGNOSIS — N35012 Post-traumatic membranous urethral stricture: Secondary | ICD-10-CM | POA: Diagnosis not present

## 2022-12-24 DIAGNOSIS — C651 Malignant neoplasm of right renal pelvis: Secondary | ICD-10-CM | POA: Diagnosis not present

## 2022-12-24 NOTE — Progress Notes (Unsigned)
12/24/2022 [name] [id number] April 05, 1940  Referring provider: Philip Aspen, [place]* Primary GI doctor: {acdocs:[id number]}  ASSESSMENT AND PLAN:   Assessment and Plan              Patient Care Team: Philip Aspen, Limmie Patricia, MD as PCP - General (Internal Medicine) Lennette Bihari, MD as PCP - Cardiology (Cardiology)  HISTORY OF PRESENT ILLNESS: 82 y.o. male with a past medical history of  depression, hypertension, coronary artery disease s/p 2 vessel CABG 2001, s/p DES 12/2018, COPD, sleep apnea does not use CPAP, diabetes mellitus type II, restless leg syndrome, bladder cancer, GERD, Barrett's esophagus, choledocholithiasis with a distal  biliary stricture s/p multiple ERCPs with stone extraction and 3 biliary stent placement with subsequent bilary stent removal 02/2020, prior pancreatitis secondary to gallstone disease and history of elevated Ig4 level. S/P cholecystectomy 12/2018 and others listed below presents for evaluation of recall colon.   12/29/2017 colonoscopy 3 mm polyp descending colon, diverticulosis, TA polyp, no dysplasia 09/12/2018 EGD which showed Barrett's esophagus without dysplasia, gastritis and a hyperplastic gastric polyp.  No evidence of H. pylori.  The esophagus was dilated.  04/10/2022 EGD and flex sigmoidoscopy   Discussed the use of AI scribe software for clinical note transcription with the patient, who gave verbal consent to proceed.  History of Present Illness             He  reports that he quit smoking about 46 years ago. His smoking use included cigarettes. He started smoking about 51 years ago. He has a 2.5 pack-year smoking history. He has never used smokeless tobacco. He reports that he does not drink alcohol and does not use drugs.   MOST RECENT GI PROCEDURES: EGD 04/10/2022  - Salmon- colored mucosal tongue suspicious for short- segment Barrett' s esophagus found distally. Biopsied. - Z- line irregular, 40 cm from the  incisors. - 1 cm hiatal hernia. - Dilation performed in the esophagus with savory 16 mm and 18 mm with mucosal wrent noted at the GE junction. - Erythematous mucosa in the antrum. No other gross lesions in the entire stomach. Biopsied. - Duodenal angulation deformity in the duodenal sweep. No other gross mucosal lesions in the duodenal bulb, in the first portion of the duodenum and in the second portion of the duodenum. Biopsied.  Flexible sigmoidoscopy 04/10/2022 - Preparation of the colon was poor. - Hemorrhoids found on digital rectal exam. - Two 6 to 8 mm polyps in the transverse colon, removed with a cold snare. Resected and retrieved. - Stool in the descending colon, at the splenic flexure and in the transverse colon. - Normal mucosa in the rectum, in the recto- sigmoid colon, in the sigmoid colon and in the descending colon. Biopsied. - Non- bleeding non- thrombosed external and internal hemorrhoids.  TA polyp and hyperplastic polyp. PATH: 1. Surgical [P], gastric antrum and gastric body - ANTRAL AND OXYNTIC MUCOSA WITH NO SIGNIFICANT PATHOLOGY. - NO HELICOBACTER PYLORI ORGANISMS IDENTIFIED ON H&E STAINED SLIDE. 2. Surgical [P], duodenal bulb, 2nd portion of duodenum, and distal duodenum - DUODENAL MUCOSA WITH PROMINENT BRUNNER'S GLANDS, OTHERWISE NO SIGNIFICANT PATHOLOGY. 3. Surgical [P], distal esophagus - SQUAMOCOLUMNAR JUNCTIONAL MUCOSA WITH MILD CHANGES CONSISTENT WITH REFLUX. - NEGATIVE FOR INTESTINAL METAPLASIA. 4. Surgical [P], random esophagus biopsies - SQUAMOUS MUCOSA WITH INCREASED INTRAEPITHELIAL LYMPHOCYTES. NOTE: THE ABOVE FINDINGS ARE NONSPECIFIC AND HAVE BEEN ASSOCIATED WITH CANDIDIASIS (A PAS STAIN IS PENDING AND WILL BE REPORTED IN AN ADDENDUM), DYSMOTILITY DISORDERS/ACHALASIA, AS  WELL AS CROHN'S DISEASE (TYPICALLY IN THE YOUNGER NATURE) AMONGST OTHERS. 5. Surgical [P], colon, transverse, sigmoid, polyp (3) - TUBULAR ADENOMA, FRAGMENTS. - HYPERPLASTIC POLYP. 6. Surgical  [P], colon nos, random sites - COLONIC MUCOSA WITHIN NORMAL LIMITS. 7. Surgical [P], colon, rectum - COLONIC MUCOSA WITHIN NORMAL LIMITS.  ERCP 06/03/2020: - Prior biliary sphincterotomy appeared open. - The entire main bile duct was moderately dilated. - A filling defect was noted that was felt to be more likely air rather than stone on fluoroscopy. - The biliary tree was swept and very small amount of sludge was found. Stones were not removed. - The filling defect looked to have been removed completely, but the size of the previous stone on CT scan made me feel need to ensure it was gone. SpyGlass interrogation was performed. There was mild narrowing in the distal CBD, but the majority of the rest of the biliary tract had normal mucosa. - Incidental mucosal tear noted after Duodenoscope/SpyScope positioning loss in the D1/D2 angle. Five hemostatic clips were successfully placed (MR conditional) to close this defect.   ERCP 03/11/2020: - No gross lesions in esophagus proximally. Salmon-colored mucosal islands suspicious for short-segment Barrett's esophagus - biopsied. - Erythematous mucosa in the antrum. No other gross lesions in the stomach. Biopsied. - No gross lesions in the duodenal bulb, in the first portion of the duodenum and in the second portion of the duodenum. - Prior biliary sphincterotomy appeared open. Three stents from the biliary tree were seen in the major papilla - these were removed and sent for cytology. - The fluoroscopic examination was suspicious for sludge as well as biliary dilation in the main duct. - A fine tapering was noted distally. Not clearly a stricture but could be narrowing. This  was dilated and brushed for cytology. - Choledocholithiasis was found. Complete removal was accomplished by sweeping. - At end of procedure, the biliary tree was swept and nothing was found. - Pull through of the 12 mm balloon through the entire duct was felt to be adequate so  I did not replace the previous plastic stents for a trial of how the patient does without stenting in regards to LFT pattern and overall clinical scenario.   ERCP 11/28/2018: second portion of the duodenum. - Prior biliary sphincterotomy appeared open. - Two visibly patent stents from the biliary tree were seen in the major papilla. These were removed and sent for cytology. - The fluoroscopic examination was suspicious for sludge/debris. - A single localized biliary stricture was found in the lower third of the main bile duct. The stricture was indeterminate. This was dilated. This was brushed for cytology. - Choledocholithiasis and biliary sludge was found. Complete removal was accomplished by sweeping on multiple occasions. Occlusion cholangiogram showed a clear duct at end of procedure. - Moderately narrowed lumen in the biliary tract visualized via SpyGlass. This was biopsied. - Three plastic biliary stents were placed into the common bile duct to aid in dilation of the narrowing. - Overall, suspect this is more an inflammatory stricture/stenosis from stone disease. The consideration of an IgG4 mediated process remains. However, there is no doubt that the patient continues to drop stones/sludge from the gallbladder into the duct as evidenced on last 2 ERCPs of significant stone/debris being removed   EGD/EUS/ERCP 09/12/2018: - Prior biliary sphincterotomy appeared open. - Filling defects consistent with stones and sludge were seen on the cholangiogram. - A single moderate biliary stricture was found in the lower third of the main bile  duct. The stricture was indeterminate. This stricture was dilated. This stricture was brushed for cytology. Two plastic biliary stents were placed into the common bile duct to traverse the stricture. - The entire main bile duct was severely dilated, secondary to aforementioned stricture. - Choledocholithiasis and clot/debris was found. Complete removal  was accomplished by sweeping after sphincteroplasty. EGD Impression: - No gross lesions in proximal/middle esophagus. Salmon-colored mucosa suspicious for short-segment Barrett's esophagus. Biopsied. Dilation of esophagus performed. - Gastritis. Biopsied. - A single gastric polyp. Resected and retrieved via mucosal resection. Clips (MR conditional) were placed to close the defect. - No gross lesions in the duodenal bulb, in the first portion of the duodenum and in the second portion of the duodenum. Biopsied for enteropathy/celiac rule out. - Plastic biliary stent in the duodenum. Removed. - Patent sphincterotomy was found. EUS Impression: - There was a suggestion of a stricture in the lower third of the main bile duct. Fine needle biopsy performed of the region (although no overt mass was noted significant thickening present). - Multiple stones were visualized endosonographically in the common bile duct. - There was dilation in the common bile duct and in the common hepatic duct which measured up to 17 mm. - The pancreatic duct had a normal endosonographic appearance in the pancreatic head, genu of the pancreas, body of the pancreas and tail of the pancreas. - Pancreatic parenchymal abnormalities consisting of diffuse echogenicity and lobularity were noted in the entire pancreas. - No malignant-appearing lymph nodes were visualized in the celiac region (level 20), peripancreatic region and porta hepatis region. 1. Duodenum, Biopsy - BENIGN SMALL BOWEL MUCOSA. - NO ACTIVE INFLAMMATION OR VILLOUS ATROPHY IDENTIFIED. 2. Stomach, biopsy - CHRONIC INACTIVE GASTRITIS. - THERE IS NO EVIDENCE OF HELICOBACTER PYLORI, DYSPLASIA OR MALIGNANCY. - SEE COMMENT. 3. Esophagus, biopsy - INTESTINAL METAPLASIA (GOBLET CELL METAPLASIA) CONSISTENT WITH BARRETT'S ESOPHAGUS. - THERE IS NO EVIDENCE OF DYSPLASIA OR MALIGNANCY. 4. Esophagus, biopsy, random - BENIGN SQUAMOUS MUCOSA. - THERE IS NO EVIDENCE  OF SIGNIFICANT INFLAMMATION, DYSPLASIA, OR MALIGNANCY. 5. Stomach, biopsy, pre-pyloric - ULCERATED HYPERPLASTIC POLYP(S). - THERE IS NO EVIDENCE OF MALIGNANCY   EUS/ERCP 04/01/2018:   EGD Impression: - White nummular lesions on esophageal mucosa. Biopsied for Candida rule out. - Gastric mucosal atrophy at fundus. Biopsied. - Non-bleeding erosive gastropathy. Biopsied for HP. - Congested, erythematous, friable (with contact bleeding), granular, inflamed and texture changed mucosa in the pylorus. Biopsied to rule out adenoma or other processes.. - No gross lesions in the duodenal bulb, in the first portion of the duodenum and in the second portion of the duodenum. - Ampulla was flat. No bile was able to be suctioned out. EUS Impression: - Pancreatic parenchymal abnormalities consisting of diffuse echogenicity were noted in the genu of the pancreas, pancreatic body and pancreatic tail. The head of the pancreas due to the heterogeneity was not grossly abnormal however, the chance of missing subtle lesions with decreased sensitivity in setting of acute pancreatitis is possible. - The pancreatic duct had a normal endosonographic appearance in the genu of the pancreas, body of the pancreas and tail of the pancreas. - There was dilation in the common bile duct and in the common hepatic duct. - There was a suggestion of a stricture in the lower third of the main bile duct. - Hyperechoic material consistent with sludge was visualized endosonographically in the common bile duct above the likely stricture. - Hyperechoic material consistent with sludge was visualized endosonographically in the gallbladder - The major  papilla appeared to be flat. - The fluoroscopic examination was suspicious for sludge. - A single moderate biliary stricture was found in the lower third of the main bile duct. The stricture was indeterminate. This was brushed for cytology. - A biliary sphincterotomy was performed. -  The biliary tree was swept and sludge and debris were found. - One plastic biliary stent was placed into the common bile duct and allowed good drainage at the end of the procedure of the biliary tree suggestive that stenosis was significant.   Colonoscopy 12/29/2017: - One 3 mm polyp in the descending colon, removed with a cold snare. Resected and retrieved. - Diverticulosis in the sigmoid colon and in the ascending colon. - The examination was otherwise normal. No cause for pain noted on this exam. 1. Surgical [P], gastric cardia nodule - HYPERPLASTIC POLYP(S). - THERE IS NO EVIDENCE OF MALIGNANCY. 2. Surgical [P], GE junction - GASTROESOPHAGEAL JUNCTION MUCOSA WITH MILD INFLAMMATION CONSISTENT WITH GASTROESOPHAGEAL REFLUX. - THERE IS NO EVIDENCE OF GOBLET CELL METAPLASIA, DYSPLASIA, OR MALIGNANCY. 3. Surgical [P], descending colon, polyp - TUBULAR ADENOMA(S). - HIGH GRADE DYSPLASIA IS NOT IDENTIFIED  RELEVANT LABS AND IMAGING:  CBC    Component Value Date/Time   WBC 6.3 06/02/2022 1000   RBC 4.53 06/02/2022 1000   HGB 10.3 (L) 06/04/2022 0503   HGB 13.7 12/09/2020 0853   HGB 13.0 04/25/2007 1127   HCT 32.1 (L) 06/04/2022 0503   HCT 40.5 12/09/2020 0853   HCT 37.0 (L) 04/25/2007 1127   PLT 158.0 06/02/2022 1000   PLT 161 12/09/2020 0853   MCV 86.7 06/02/2022 1000   MCV 87 12/09/2020 0853   MCV 89.0 04/25/2007 1127   MCH 28.3 05/22/2022 0934   MCHC 33.4 06/02/2022 1000   RDW 14.3 06/02/2022 1000   RDW 13.0 12/09/2020 0853   RDW 13.8 04/25/2007 1127   LYMPHSABS 1.9 06/02/2022 1000   LYMPHSABS 2.5 04/25/2007 1127   MONOABS 0.5 06/02/2022 1000   MONOABS 0.6 04/25/2007 1127   EOSABS 0.3 06/02/2022 1000   EOSABS 0.0 04/25/2007 1127   BASOSABS 0.1 06/02/2022 1000   BASOSABS 0.0 04/25/2007 1127   Recent Labs    03/25/22 0855 04/29/22 1330 05/22/22 0934 06/02/22 1000 06/03/22 1539 06/04/22 0503  HGB 13.3 12.5* 12.6* 13.1 12.2* 10.3*    CMP     Component Value  Date/Time   NA 133 (L) 06/04/2022 0503   NA 144 12/09/2020 0853   K 4.0 06/04/2022 0503   CL 100 06/04/2022 0503   CO2 26 06/04/2022 0503   GLUCOSE 178 (H) 06/04/2022 0503   BUN 32 (H) 06/04/2022 0503   BUN 21 12/09/2020 0853   CREATININE 2.18 (H) 06/04/2022 0503   CREATININE 1.61 (H) 11/09/2019 0939   CALCIUM 7.8 (L) 06/04/2022 0503   PROT 6.8 06/02/2022 1000   PROT 7.2 12/09/2020 0853   ALBUMIN 3.9 06/02/2022 1000   ALBUMIN 4.2 12/09/2020 0853   AST 16 06/02/2022 1000   ALT 7 06/02/2022 1000   ALKPHOS 58 06/02/2022 1000   BILITOT 0.5 06/02/2022 1000   BILITOT 0.4 12/09/2020 0853   GFRNONAA 30 (L) 06/04/2022 0503   GFRAA 47 (L) 11/13/2019 1006      Latest Ref Rng & Units 06/02/2022   10:00 AM 05/22/2022    9:34 AM 04/29/2022    1:30 PM  Hepatic Function  Total Protein 6.0 - 8.3 g/dL 6.8  7.3  6.9   Albumin 3.5 - 5.2 g/dL 3.9  3.8  3.8  AST 0 - 37 U/L 16  18  16    ALT 0 - 53 U/L 7  11  7    Alk Phosphatase 39 - 117 U/L 58  56  57   Total Bilirubin 0.2 - 1.2 mg/dL 0.5  0.6  0.3       Current Medications:   Current Outpatient Medications (Endocrine & Metabolic):    predniSONE (STERAPRED UNI-PAK 21 TAB) 10 MG (21) TBPK tablet, Take as directed  Current Outpatient Medications (Cardiovascular):    amLODipine (NORVASC) 5 MG tablet, TAKE 1 TABLET BY MOUTH DAILY   ezetimibe (ZETIA) 10 MG tablet, TAKE 1 TABLET BY MOUTH DAILY   isosorbide mononitrate (IMDUR) 60 MG 24 hr tablet, TAKE 1 TABLET BY MOUTH DAILY   nebivolol (BYSTOLIC) 10 MG tablet, TAKE 1 TABLET BY MOUTH DAILY   nitroGLYCERIN (NITROSTAT) 0.4 MG SL tablet, DISSOLVE ONE TABLET UNDER THE TONGUE EVERY 5 MINUTES AS NEEDED FOR CHEST PAIN.   rosuvastatin (CRESTOR) 10 MG tablet, TAKE 1 TABLET BY MOUTH DAILY  Current Outpatient Medications (Respiratory):    cetirizine (ZYRTEC) 10 MG tablet, Take 10 mg by mouth daily as needed for allergies.   fluticasone (FLONASE) 50 MCG/ACT nasal spray, Place 1 spray into both nostrils  daily as needed for allergies.  Current Outpatient Medications (Analgesics):    acetaminophen (TYLENOL) 500 MG tablet, Take 1,000 mg by mouth every 6 (six) hours as needed for moderate pain.   aspirin EC 81 MG tablet, Take 81 mg by mouth every evening. Swallow whole.   oxyCODONE-acetaminophen (PERCOCET) 10-325 MG tablet, Take 1 tablet by mouth 2 (two) times daily.   oxyCODONE-acetaminophen (PERCOCET) 10-325 MG tablet, Take 1 tablet by mouth 2 (two) times daily.  Current Outpatient Medications (Hematological):    clopidogrel (PLAVIX) 75 MG tablet, TAKE 1 TABLET BY MOUTH ONCE  DAILY   cyanocobalamin (VITAMIN B12) 1000 MCG/ML injection, Inject 1,000 mcg into the skin every 30 (thirty) days.   Fe Bisgly-Vit C-Vit B12-FA (GENTLE IRON PO), Take by mouth. daily  Current Outpatient Medications (Other):    Coenzyme Q10 200 MG capsule, Take 200 mg by mouth daily.   Menthol, Topical Analgesic, (BENGAY EX), Apply 1 application topically daily as needed (pain).   METAMUCIL FIBER PO, Take 1 capsule by mouth in the morning.   Multiple Vitamin (MULTIVITAMIN) tablet, Take 1 tablet by mouth daily.   Naphazoline-Pheniramine (OPCON-A) 0.027-0.315 % SOLN, Place 1 drop into both eyes daily as needed (itching eyes).   NEEDLE, DISP, 25 G (B-D DISP NEEDLE 25GX1") 25G X 1" MISC, Inject 1000 mcg into muscle once a month.   pantoprazole (PROTONIX) 40 MG tablet, TAKE 1 TABLET BY MOUTH ONCE  DAILY   polyethylene glycol powder (GLYCOLAX/MIRALAX) 17 GM/SCOOP powder, Take 1 Container by mouth daily. As needed   rOPINIRole (REQUIP) 2 MG tablet, TAKE 1 TABLET BY MOUTH AT  BEDTIME   saccharomyces boulardii (FLORASTOR) 250 MG capsule, Take 250 mg by mouth 2 (two) times daily.   venlafaxine (EFFEXOR) 75 MG tablet, TAKE 1 TABLET BY MOUTH DAILY  Medical History:  Past Medical History:  Diagnosis Date   Allergy    Anxiety    B12 deficiency anemia    Blood transfusion without reported diagnosis    CAD (coronary artery  disease)    Cancer (HCC)    bladder, Right renal cell also   Chronic kidney disease    Colon polyps    COPD (chronic obstructive pulmonary disease) (HCC)    Depression  Diabetes mellitus (HCC)    Dyspnea    Esophagus, Barrett's    GERD (gastroesophageal reflux disease)    Headache    History of bladder cancer    Bladder cancer "8 times"   History of hiatal hernia    Hyperlipidemia    Hypertension    Localized osteoarthrosis, lower leg    Myocardial infarction (HCC) 2021   Pneumonia    Restless leg syndrome    Sleep apnea    does not wear cpap   Stenosis of esophagus    Allergies: No Known Allergies   Surgical History:  He  has a past surgical history that includes Coronary artery bypass graft; Cervical discectomy; Lumbar laminectomy; bladder cancer; Popliteal synovial cyst excision; Knee arthroscopy (Left); Esophagogastroduodenoscopy (04/29/2010); Colonoscopy (11/17/2005); I & D extremity (Left, 11/24/2016); Nasal sinus surgery; Endoscopic retrograde cholangiopancreatography (ercp) with propofol (N/A, 04/01/2018); biopsy (04/01/2018); sphincterotomy (04/01/2018); Biliary brushing (04/01/2018); biliary stent placement (04/01/2018); Esophagogastroduodenoscopy (egd) with propofol (N/A, 04/01/2018); EUS (04/01/2018); removal of stones (04/01/2018); Esophagogastroduodenoscopy (egd) with propofol (N/A, 09/12/2018); EUS (N/A, 09/12/2018); Endoscopic retrograde cholangiopancreatography (ercp) with propofol (N/A, 09/12/2018); Stent removal (09/12/2018); biopsy (09/12/2018); Fine needle aspiration (09/12/2018); Biliary dilation (09/12/2018); biliary stent placement (09/12/2018); Biliary brushing (09/12/2018); removal of stones (09/12/2018); Endoscopic mucosal resection (09/12/2018); Submucosal lifting injection (09/12/2018); Hemostasis clip placement (09/12/2018); Savory dilation (N/A, 09/12/2018); ERCP (N/A, 11/28/2018); Spyglass cholangioscopy (N/A, 11/28/2018); Savory dilation (N/A,  11/28/2018); Esophagogastroduodenoscopy (egd) with propofol (N/A, 11/28/2018); Stent removal (11/28/2018); Biliary dilation (11/28/2018); biopsy (11/28/2018); Biliary brushing (11/28/2018); removal of stones (11/28/2018); biliary stent placement (11/28/2018); LEFT HEART CATH AND CORS/GRAFTS ANGIOGRAPHY (N/A, 01/05/2019); CORONARY STENT INTERVENTION (N/A, 01/05/2019); Endoscopic retrograde cholangiopancreatography (ercp) with propofol (N/A, 03/11/2020); Esophagogastroduodenoscopy (egd) with propofol (N/A, 03/11/2020); biopsy (03/11/2020); Stent removal (03/11/2020); removal of stones (03/11/2020); Biliary dilation (03/11/2020); Biliary brushing (03/11/2020); Hand surgery (Left, 2018); Endoscopic retrograde cholangiopancreatography (ercp) with propofol (N/A, 06/03/2020); Hemostasis clip placement (06/03/2020); Spyglass cholangioscopy (N/A, 06/03/2020); removal of stones (06/03/2020); Esophagogastroduodenoscopy (egd) with propofol (N/A, 06/03/2020); Septoplasty (Bilateral, 05/26/2021); Cholecystectomy; Cystoscopy with retrograde pyelogram, ureteroscopy and stent placement (Right, 03/27/2022); Cystoscopy with biopsy (N/A, 03/27/2022); and Robot assited laparoscopic nephroureterectomy (Right, 06/03/2022). Family History:  His family history includes Coronary artery disease in an other family member; Hypertension in his father; Melanoma in his mother; Stroke in his father.  REVIEW OF SYSTEMS  : All other systems reviewed and negative except where noted in the History of Present Illness.  PHYSICAL EXAM: There were no vitals taken for this visit. General Appearance: Well nourished, in no apparent distress. Head:   Normocephalic and atraumatic. Eyes:  sclerae anicteric,conjunctive pink  Respiratory: Respiratory effort normal, BS equal bilaterally without rales, rhonchi, wheezing. Cardio: RRR with no MRGs. Peripheral pulses intact.  Abdomen: Soft,  {BlankSingle:19197::"Flat","Obese","Non-distended"} ,active bowel  sounds. {actendernessAB:27319} tenderness {anatomy; site abdomen:5010}. {BlankMultiple:19196::"Without guarding","With guarding","Without rebound","With rebound"}. No masses. Rectal: {acrectalexam:27461} Musculoskeletal: Full ROM, {PSY - GAIT AND STATION:22860} gait. {With/Without:[id number]} edema. Skin:  Dry and intact without significant lesions or rashes Neuro: Alert and  oriented x4;  No focal deficits. Psych:  Cooperative. Normal mood and affect.    Doree Albee, PA-C 3:17 PM

## 2022-12-28 ENCOUNTER — Ambulatory Visit: Payer: Medicare Other | Admitting: Physician Assistant

## 2022-12-28 ENCOUNTER — Encounter: Payer: Self-pay | Admitting: Physician Assistant

## 2022-12-28 ENCOUNTER — Other Ambulatory Visit (INDEPENDENT_AMBULATORY_CARE_PROVIDER_SITE_OTHER): Payer: Medicare Other

## 2022-12-28 VITALS — BP 102/64 | HR 63 | Wt 164.0 lb

## 2022-12-28 DIAGNOSIS — R1013 Epigastric pain: Secondary | ICD-10-CM | POA: Diagnosis not present

## 2022-12-28 DIAGNOSIS — R109 Unspecified abdominal pain: Secondary | ICD-10-CM

## 2022-12-28 DIAGNOSIS — K449 Diaphragmatic hernia without obstruction or gangrene: Secondary | ICD-10-CM

## 2022-12-28 DIAGNOSIS — K219 Gastro-esophageal reflux disease without esophagitis: Secondary | ICD-10-CM | POA: Diagnosis not present

## 2022-12-28 DIAGNOSIS — R1310 Dysphagia, unspecified: Secondary | ICD-10-CM | POA: Diagnosis not present

## 2022-12-28 DIAGNOSIS — R634 Abnormal weight loss: Secondary | ICD-10-CM

## 2022-12-28 DIAGNOSIS — K6289 Other specified diseases of anus and rectum: Secondary | ICD-10-CM | POA: Diagnosis not present

## 2022-12-28 DIAGNOSIS — K838 Other specified diseases of biliary tract: Secondary | ICD-10-CM

## 2022-12-28 DIAGNOSIS — I25709 Atherosclerosis of coronary artery bypass graft(s), unspecified, with unspecified angina pectoris: Secondary | ICD-10-CM

## 2022-12-28 DIAGNOSIS — R1319 Other dysphagia: Secondary | ICD-10-CM

## 2022-12-28 LAB — CBC WITH DIFFERENTIAL/PLATELET
Basophils Absolute: 0.1 10*3/uL (ref 0.0–0.1)
Basophils Relative: 1 % (ref 0.0–3.0)
Eosinophils Absolute: 0.4 10*3/uL (ref 0.0–0.7)
Eosinophils Relative: 4.9 % (ref 0.0–5.0)
HCT: 36.9 % — ABNORMAL LOW (ref 39.0–52.0)
Hemoglobin: 12.3 g/dL — ABNORMAL LOW (ref 13.0–17.0)
Lymphocytes Relative: 30.6 % (ref 12.0–46.0)
Lymphs Abs: 2.3 10*3/uL (ref 0.7–4.0)
MCHC: 33.2 g/dL (ref 30.0–36.0)
MCV: 86.8 fL (ref 78.0–100.0)
Monocytes Absolute: 0.7 10*3/uL (ref 0.1–1.0)
Monocytes Relative: 9 % (ref 3.0–12.0)
Neutro Abs: 4.1 10*3/uL (ref 1.4–7.7)
Neutrophils Relative %: 54.5 % (ref 43.0–77.0)
Platelets: 190 10*3/uL (ref 150.0–400.0)
RBC: 4.25 Mil/uL (ref 4.22–5.81)
RDW: 14.2 % (ref 11.5–15.5)
WBC: 7.5 10*3/uL (ref 4.0–10.5)

## 2022-12-28 LAB — COMPREHENSIVE METABOLIC PANEL
ALT: 7 U/L (ref 0–53)
AST: 16 U/L (ref 0–37)
Albumin: 4 g/dL (ref 3.5–5.2)
Alkaline Phosphatase: 66 U/L (ref 39–117)
BUN: 35 mg/dL — ABNORMAL HIGH (ref 6–23)
CO2: 27 meq/L (ref 19–32)
Calcium: 8.8 mg/dL (ref 8.4–10.5)
Chloride: 107 meq/L (ref 96–112)
Creatinine, Ser: 2.6 mg/dL — ABNORMAL HIGH (ref 0.40–1.50)
GFR: 22.57 mL/min — ABNORMAL LOW (ref >60.00)
Glucose, Bld: 99 mg/dL (ref 70–99)
Potassium: 5.3 meq/L — ABNORMAL HIGH (ref 3.5–5.1)
Sodium: 143 meq/L (ref 135–145)
Total Bilirubin: 0.3 mg/dL (ref 0.2–1.2)
Total Protein: 7 g/dL (ref 6.0–8.3)

## 2022-12-28 LAB — SEDIMENTATION RATE: Sed Rate: 12 mm/h (ref 0–20)

## 2022-12-28 LAB — TSH: TSH: 3.24 u[IU]/mL (ref 0.35–5.50)

## 2022-12-28 MED ORDER — METRONIDAZOLE 250 MG PO TABS: 250.0000 mg | ORAL_TABLET | Freq: Three times a day (TID) | ORAL | 0 refills | Status: AC

## 2022-12-28 MED ORDER — NON FORMULARY: 1 refills | Status: DC

## 2022-12-28 NOTE — Progress Notes (Signed)
Attending Physician's Attestation   I have reviewed the chart.   I agree with the Advanced Practitioner's note, impression, and recommendations with any updates as below.    Emersyn Wyss Mansouraty, MD [facility] Advanced [facility]oscopy Office # [phone]  

## 2022-12-28 NOTE — Patient Instructions (Addendum)
You have been scheduled for a CT scan of the abdomen and pelvis at [facility], 1st floor Radiology. You are scheduled on 01/01/2023 at 3:45pm . You should arrive 15 minutes prior to your appointment time for registration. Please follow the written instructions below on the day of your exam:   1) Do not eat anything after 4 hours prior to your test.  You may take any medications as prescribed with a small amount of water, if necessary. If you take any of the following medications: METFORMIN, GLUCOPHAGE, GLUCOVANCE, AVANDAMET, RIOMET, FORTAMET, ACTOPLUS MET, JANUMET, GLUMETZA or METAGLIP, you MAY be asked to HOLD this medication 48 hours AFTER the exam.   The purpose of you drinking the oral contrast is to aid in the visualization of your intestinal tract. The contrast solution may cause some diarrhea. Depending on your individual set of symptoms, you may also receive an intravenous injection of x-ray contrast/dye. Plan on being at [facility] for 45 minutes or longer, depending on the type of exam you are having performed.   If you have any questions regarding your exam or if you need to reschedule, you may call [facility] at [phone] between the hours of 8:00 am and 5:00 pm, Monday-Friday.     Your provider has requested that you go to the basement level for lab work before leaving today. Press "B" on the elevator. The lab is located at the first door on the left as you exit the elevator.    Anal Fissure, Adult  A fissure is a linear defect in the anal mucosa, symptoms include burning, itching, discomfort especially with a bowel movement with associated rectal bleeding.  Risk factors include low fiber diet, chronic constipation and straining. Anal fissures can take a very long time to heal so this will be a 2 to 19-month process.  Treatment for a fissure includes:  -decreasing time in the toilet should not be more than 5 minutes -adding fiber supplement such as  Benefiber or Citrucel -increasing water.  Diltiazem/lidocaine 3 x daily for 2 months sent to compound pharmacy   Sent this medication to a compound pharmacy:  [facility] [address] [place], [place], [place] [address]  [phone]    FODMAP stands for fermentable oligo-, di-, mono-saccharides and polyols (1). These are the scientific terms used to classify groups of carbs that are difficult for our body to digest and that are notorious for triggering digestive symptoms like bloating, gas, loose stools and stomach pain.   You can try low FODMAP diet  - start with eliminating just one column at a time that you feel may be a trigger for you. - the table at the very bottom contains foods that are low in FODMAPs   Sometimes trying to eliminate the FODMAP's from your diet is difficult or tricky, if you are stuggling with trying to do the elimination diet you can try an enzyme.  There is a food enzymes that you sprinkle in or on your food that helps break down the FODMAP. You can read more about the enzyme by going to this site: https://fodzyme.com/

## 2022-12-29 ENCOUNTER — Other Ambulatory Visit: Payer: Self-pay

## 2022-12-29 DIAGNOSIS — R1013 Epigastric pain: Secondary | ICD-10-CM

## 2023-01-01 ENCOUNTER — Other Ambulatory Visit: Payer: Medicare Other

## 2023-01-01 ENCOUNTER — Ambulatory Visit (HOSPITAL_COMMUNITY)
Admission: RE | Admit: 2023-01-01 | Discharge: 2023-01-01 | Disposition: A | Discharge status: Home / Self Care | Payer: Medicare Other | Source: Ambulatory Visit | Attending: Physician Assistant | Admitting: Physician Assistant

## 2023-01-01 DIAGNOSIS — K838 Other specified diseases of biliary tract: Secondary | ICD-10-CM

## 2023-01-01 DIAGNOSIS — R1013 Epigastric pain: Secondary | ICD-10-CM

## 2023-01-01 DIAGNOSIS — K5730 Diverticulosis of large intestine without perforation or abscess without bleeding: Secondary | ICD-10-CM | POA: Diagnosis not present

## 2023-01-01 DIAGNOSIS — Z905 Acquired absence of kidney: Secondary | ICD-10-CM | POA: Diagnosis not present

## 2023-01-01 DIAGNOSIS — R109 Unspecified abdominal pain: Secondary | ICD-10-CM | POA: Diagnosis not present

## 2023-01-05 LAB — FECAL FAT, QUALITATIVE
Fat Qual Neutral, Stl: NORMAL
Fat Qual Total, Stl: NORMAL

## 2023-01-07 ENCOUNTER — Other Ambulatory Visit: Payer: Self-pay | Admitting: Internal Medicine

## 2023-01-07 LAB — PANCREATIC ELASTASE, FECAL: Pancreatic Elastase-1, Stool: >800 ug/g (ref >200)

## 2023-01-25 ENCOUNTER — Other Ambulatory Visit: Payer: Self-pay | Admitting: Internal Medicine

## 2023-01-25 DIAGNOSIS — E038 Other specified hypothyroidism: Secondary | ICD-10-CM

## 2023-02-11 ENCOUNTER — Ambulatory Visit: Payer: Medicare Other | Admitting: Internal Medicine

## 2023-02-11 ENCOUNTER — Encounter: Payer: Self-pay | Admitting: Internal Medicine

## 2023-02-11 VITALS — BP 120/60 | HR 62 | Temp 97.6°F | Wt 167.2 lb

## 2023-02-11 DIAGNOSIS — E119 Type 2 diabetes mellitus without complications: Secondary | ICD-10-CM

## 2023-02-11 DIAGNOSIS — G894 Chronic pain syndrome: Secondary | ICD-10-CM

## 2023-02-11 LAB — POCT GLYCOSYLATED HEMOGLOBIN (HGB A1C): Hemoglobin A1C: 5.7 % — AB (ref 4.0–5.6)

## 2023-02-11 MED ORDER — OXYCODONE-ACETAMINOPHEN 10-325 MG PO TABS: 1.0000 | ORAL_TABLET | Freq: Two times a day (BID) | ORAL | 0 refills | Status: DC

## 2023-02-11 NOTE — Progress Notes (Signed)
Established Patient Office Visit     CC/Reason for Visit: Follow-up chronic conditions, medication refills  HPI: [name] is a 83 y.o. male who is coming in today for the above mentioned reasons. Past Medical History is significant for: Type 2 diabetes, heart failure with preserved ejection fraction, chronic pain syndrome on oxycodone, ascending cholangitis, pancreatitis, right renal cell carcinoma status post nephrectomy, history of MI.  He continues to have chronic joint pain.  Has been complaining of some abdominal pain with eating.  He saw GI in December who did a CT abdomen that was without abnormality.  Has follow-up with them scheduled.   Past Medical/Surgical History: Past Medical History:  Diagnosis Date   Allergy    Anxiety    B12 deficiency anemia    Blood transfusion without reported diagnosis    CAD (coronary artery disease)    Cancer (HCC)    bladder, Right renal cell also   Chronic kidney disease    Colon polyps    COPD (chronic obstructive pulmonary disease) (HCC)    Depression    Diabetes mellitus (HCC)    Dyspnea    Esophagus, Barrett's    GERD (gastroesophageal reflux disease)    Headache    History of bladder cancer    Bladder cancer "8 times"   History of hiatal hernia    Hyperlipidemia    Hypertension    Localized osteoarthrosis, lower leg    Myocardial infarction (HCC) 2021   Pneumonia    Restless leg syndrome    Sleep apnea    does not wear cpap   Stenosis of esophagus     Past Surgical History:  Procedure Laterality Date   BILIARY BRUSHING  04/01/2018   Procedure: BILIARY BRUSHING;  Surgeon: Lemar Lofty., MD;  Location: [facility] ENDOSCOPY;  Service: Gastroenterology;;   BILIARY BRUSHING  09/12/2018   Procedure: BILIARY BRUSHING;  Surgeon: Lemar Lofty., MD;  Location: [facility] ENDOSCOPY;  Service: Gastroenterology;;   BILIARY BRUSHING  11/28/2018   Procedure: BILIARY BRUSHING;  Surgeon: Lemar Lofty., MD;   Location: [facility] ENDOSCOPY;  Service: Gastroenterology;;   BILIARY BRUSHING  03/11/2020   Procedure: BILIARY BRUSHING;  Surgeon: Lemar Lofty., MD;  Location: [facility] ENDOSCOPY;  Service: Gastroenterology;;   BILIARY DILATION  09/12/2018   Procedure: BILIARY DILATION;  Surgeon: Lemar Lofty., MD;  Location: [facility] ENDOSCOPY;  Service: Gastroenterology;;   BILIARY DILATION  11/28/2018   Procedure: BILIARY DILATION;  Surgeon: Lemar Lofty., MD;  Location: [facility] ENDOSCOPY;  Service: Gastroenterology;;   BILIARY DILATION  03/11/2020   Procedure: BILIARY DILATION;  Surgeon: Lemar Lofty., MD;  Location: [facility] ENDOSCOPY;  Service: Gastroenterology;;   BILIARY STENT PLACEMENT  04/01/2018   Procedure: BILIARY STENT PLACEMENT;  Surgeon: Lemar Lofty., MD;  Location: [facility] ENDOSCOPY;  Service: Gastroenterology;;   BILIARY STENT PLACEMENT  09/12/2018   Procedure: BILIARY STENT PLACEMENT;  Surgeon: Lemar Lofty., MD;  Location: [facility] ENDOSCOPY;  Service: Gastroenterology;;   BILIARY STENT PLACEMENT  11/28/2018   Procedure: BILIARY STENT PLACEMENT;  Surgeon: Lemar Lofty., MD;  Location: [facility] ENDOSCOPY;  Service: Gastroenterology;;   BIOPSY  04/01/2018   Procedure: BIOPSY;  Surgeon: Lemar Lofty., MD;  Location: [facility] ENDOSCOPY;  Service: Gastroenterology;;   BIOPSY  09/12/2018   Procedure: BIOPSY;  Surgeon: Lemar Lofty., MD;  Location: [facility] ENDOSCOPY;  Service: Gastroenterology;;   BIOPSY  11/28/2018   Procedure: BIOPSY;  Surgeon: Lemar Lofty., MD;  Location: [facility]  ENDOSCOPY;  Service: Gastroenterology;;   BIOPSY  03/11/2020   Procedure: BIOPSY;  Surgeon: Lemar Lofty., MD;  Location: [facility] ENDOSCOPY;  Service: Gastroenterology;;   bladder cancer      x 8 cystoscopy   CERVICAL DISCECTOMY     ACDF   CHOLECYSTECTOMY     COLONOSCOPY  11/17/2005   normal    CORONARY ARTERY BYPASS GRAFT     x4   CORONARY STENT INTERVENTION N/A  01/05/2019   Procedure: CORONARY STENT INTERVENTION;  Surgeon: Yvonne Kendall, MD;  Location: [facility] INVASIVE CV LAB;  Service: Cardiovascular;  Laterality: N/A;   CYSTOSCOPY WITH BIOPSY N/A 03/27/2022   Procedure: CYSTOSCOPY WITH BIOPSY;  Surgeon: Bjorn Pippin, MD;  Location: [facility] ORS;  Service: Urology;  Laterality: N/A;   CYSTOSCOPY WITH RETROGRADE PYELOGRAM, URETEROSCOPY AND STENT PLACEMENT Right 03/27/2022   Procedure: CYSTOSCOPY WITH RIGHT RETROGRADE PYELOGRAM, URETEROSCOPY AND STENT PLACEMENT urethral dilation;  Surgeon: Bjorn Pippin, MD;  Location: [facility] ORS;  Service: Urology;  Laterality: Right;   ENDOSCOPIC MUCOSAL RESECTION  09/12/2018   Procedure: ENDOSCOPIC MUCOSAL RESECTION;  Surgeon: Meridee Score Netty Starring., MD;  Location: [facility] ENDOSCOPY;  Service: Gastroenterology;;   ENDOSCOPIC RETROGRADE CHOLANGIOPANCREATOGRAPHY (ERCP) WITH PROPOFOL N/A 04/01/2018   Procedure: ENDOSCOPIC RETROGRADE CHOLANGIOPANCREATOGRAPHY (ERCP) WITH PROPOFOL;  Surgeon: Lemar Lofty., MD;  Location: [facility] ENDOSCOPY;  Service: Gastroenterology;  Laterality: N/A;   ENDOSCOPIC RETROGRADE CHOLANGIOPANCREATOGRAPHY (ERCP) WITH PROPOFOL N/A 09/12/2018   Procedure: ENDOSCOPIC RETROGRADE CHOLANGIOPANCREATOGRAPHY (ERCP) WITH PROPOFOL;  Surgeon: Meridee Score Netty Starring., MD;  Location: [facility] ENDOSCOPY;  Service: Gastroenterology;  Laterality: N/A;   ENDOSCOPIC RETROGRADE CHOLANGIOPANCREATOGRAPHY (ERCP) WITH PROPOFOL N/A 03/11/2020   Procedure: ENDOSCOPIC RETROGRADE CHOLANGIOPANCREATOGRAPHY (ERCP) WITH PROPOFOL;  Surgeon: Meridee Score Netty Starring., MD;  Location: [facility] ENDOSCOPY;  Service: Gastroenterology;  Laterality: N/A;   ENDOSCOPIC RETROGRADE CHOLANGIOPANCREATOGRAPHY (ERCP) WITH PROPOFOL N/A 06/03/2020   Procedure: ENDOSCOPIC RETROGRADE CHOLANGIOPANCREATOGRAPHY (ERCP) WITH PROPOFOL;  Surgeon: Meridee Score Netty Starring., MD;  Location: [facility] ENDOSCOPY;  Service: Gastroenterology;  Laterality: N/A;   ERCP N/A 11/28/2018   Procedure: ENDOSCOPIC  RETROGRADE CHOLANGIOPANCREATOGRAPHY (ERCP) +EGD with spyglass;  Surgeon: Meridee Score Netty Starring., MD;  Location: [facility] ENDOSCOPY;  Service: Gastroenterology;  Laterality: N/A;   ESOPHAGOGASTRODUODENOSCOPY  04/29/2010   ESOPHAGOGASTRODUODENOSCOPY (EGD) WITH PROPOFOL N/A 04/01/2018   Procedure: ESOPHAGOGASTRODUODENOSCOPY (EGD) WITH PROPOFOL;  Surgeon: Meridee Score Netty Starring., MD;  Location[facility] ENDOSCOPY;  Service: Gastroenterology;  Laterality: N/A;   ESOPHAGOGASTRODUODENOSCOPY (EGD) WITH PROPOFOL N/A 09/12/2018   Procedure: ESOPHAGOGASTRODUODENOSCOPY (EGD) WITH PROPOFOL;  Surgeon: Meridee Score Netty Starring., MD;  Location: [facility] ENDOSCOPY;  Service: Gastroenterology;  Laterality: N/A;   ESOPHAGOGASTRODUODENOSCOPY (EGD) WITH PROPOFOL N/A 11/28/2018   Procedure: ESOPHAGOGASTRODUODENOSCOPY (EGD) WITH PROPOFOL;  Surgeon: Meridee Score Netty Starring., MD;  Location: [facility] ENDOSCOPY;  Service: Gastroenterology;  Laterality: N/A;   ESOPHAGOGASTRODUODENOSCOPY (EGD) WITH PROPOFOL N/A 03/11/2020   Procedure: ESOPHAGOGASTRODUODENOSCOPY (EGD) WITH PROPOFOL;  Surgeon: Meridee Score Netty Starring., MD;  Location: [facility] ENDOSCOPY;  Service: Gastroenterology;  Laterality: N/A;   ESOPHAGOGASTRODUODENOSCOPY (EGD) WITH PROPOFOL N/A 06/03/2020   Procedure: ESOPHAGOGASTRODUODENOSCOPY (EGD) WITH PROPOFOL;  Surgeon: Meridee Score Netty Starring., MD;  Location: [facility] ENDOSCOPY;  Service: Gastroenterology;  Laterality: N/A;   EUS  04/01/2018   Procedure: FULL UPPER ENDOSCOPIC ULTRASOUND (EUS) RADIAL;  Surgeon: Lemar Lofty., MD;  Location: [facility] ENDOSCOPY;  Service: Gastroenterology;;   EUS N/A 09/12/2018   Procedure: UPPER ENDOSCOPIC ULTRASOUND (EUS) RADIAL;  Surgeon: Lemar Lofty., MD;  Location: [facility] ENDOSCOPY;  Service: Gastroenterology;  Laterality: N/A;   FINE NEEDLE ASPIRATION  09/12/2018   Procedure: FINE NEEDLE ASPIRATION (FNA)  LINEAR;  Surgeon: Meridee Score Netty Starring., MD;  Location: [facility] ENDOSCOPY;  Service: Gastroenterology;;    HAND SURGERY Left 2018   saw accident   HEMOSTASIS CLIP PLACEMENT  09/12/2018   Procedure: HEMOSTASIS CLIP PLACEMENT;  Surgeon: Lemar Lofty., MD;  Location: [facility] ENDOSCOPY;  Service: Gastroenterology;;   HEMOSTASIS CLIP PLACEMENT  06/03/2020   Procedure: HEMOSTASIS CLIP PLACEMENT;  Surgeon: Lemar Lofty., MD;  Location: [facility] ENDOSCOPY;  Service: Gastroenterology;;   I & D EXTREMITY Left 11/24/2016   Procedure: IRRIGATION AND DEBRIDEMENT LEFT HAND, THUMB, INDEX, MIDDLE, RING, AND SMALL FINGERS WITH RECONSTRUCTION;  Surgeon: Dominica Severin, MD;  Location: [facility] OR;  Service: Orthopedics;  Laterality: Left;   KNEE ARTHROSCOPY Left    LEFT HEART CATH AND CORS/GRAFTS ANGIOGRAPHY N/A 01/05/2019   Procedure: LEFT HEART CATH AND CORS/GRAFTS ANGIOGRAPHY;  Surgeon: Yvonne Kendall, MD;  Location: [facility] INVASIVE CV LAB;  Service: Cardiovascular;  Laterality: N/A;   LUMBAR LAMINECTOMY     and fusion x 2   NASAL SINUS SURGERY     POPLITEAL SYNOVIAL CYST EXCISION     REMOVAL OF STONES  04/01/2018   Procedure: REMOVAL OF STONES;  Surgeon: Meridee Score Netty Starring., MD;  Location: [facility] ENDOSCOPY;  Service: Gastroenterology;;   REMOVAL OF STONES  09/12/2018   Procedure: REMOVAL OF STONES;  Surgeon: Lemar Lofty., MD;  Location: [facility] ENDOSCOPY;  Service: Gastroenterology;;   REMOVAL OF STONES  11/28/2018   Procedure: REMOVAL OF STONES;  Surgeon: Lemar Lofty., MD;  Location: [facility] ENDOSCOPY;  Service: Gastroenterology;;   REMOVAL OF STONES  03/11/2020   Procedure: REMOVAL OF STONES;  Surgeon: Lemar Lofty., MD;  Location: [facility] ENDOSCOPY;  Service: Gastroenterology;;   REMOVAL OF STONES  06/03/2020   Procedure: REMOVAL OF STONES;  Surgeon: Lemar Lofty., MD;  Location: [facility] ENDOSCOPY;  Service: Gastroenterology;;   ROBOT ASSITED LAPAROSCOPIC NEPHROURETERECTOMY Right 06/03/2022   Procedure: XI ROBOT ASSITED LAPAROSCOPIC NEPHROURETERECTOMY, FLEXIBLE CYSTOSCOPY;  Surgeon:  Loletta Parish., MD;  Location: [facility] ORS;  Service: Urology;  Laterality: Right;  3 HRS   SAVORY DILATION N/A 09/12/2018   Procedure: SAVORY DILATION;  Surgeon: Meridee Score Netty Starring., MD;  Location: [facility] ENDOSCOPY;  Service: Gastroenterology;  Laterality: N/A;   SAVORY DILATION N/A 11/28/2018   Procedure: SAVORY DILATION;  Surgeon: Meridee Score Netty Starring., MD;  Location: [facility] ENDOSCOPY;  Service: Gastroenterology;  Laterality: N/A;   SEPTOPLASTY Bilateral 05/26/2021   Procedure: SEPTOPLASTY;  Surgeon: Newman Pies, MD;  Location: [facility];  Service: ENT;  Laterality: Bilateral;   SPHINCTEROTOMY  04/01/2018   Procedure: Dennison Mascot;  Surgeon: Mansouraty, Netty Starring., MD;  Location: [facility] ENDOSCOPY;  Service: Gastroenterology;;   [place] CHOLANGIOSCOPY N/A 11/28/2018   Procedure: DGUYQIHK CHOLANGIOSCOPY;  Surgeon: Lemar Lofty., MD;  Location: [facility] ENDOSCOPY;  Service: Gastroenterology;  Laterality: N/A;   SPYGLASS CHOLANGIOSCOPY N/A 06/03/2020   Procedure: SPYGLASS CHOLANGIOSCOPY;  Surgeon: Lemar Lofty., MD;  Location: [facility] ENDOSCOPY;  Service: Gastroenterology;  Laterality: N/A;   STENT REMOVAL  09/12/2018   Procedure: STENT REMOVAL;  Surgeon: Lemar Lofty., MD;  Location: [facility] ENDOSCOPY;  Service: Gastroenterology;;   Francine Graven REMOVAL  11/28/2018   Procedure: STENT REMOVAL;  Surgeon: Lemar Lofty., MD;  Location: [facility] ENDOSCOPY;  Service: Gastroenterology;;   Francine Graven REMOVAL  03/11/2020   Procedure: STENT REMOVAL;  Surgeon: Lemar Lofty., MD;  Location: [facility] ENDOSCOPY;  Service: Gastroenterology;;   SUBMUCOSAL LIFTING INJECTION  09/12/2018   Procedure: SUBMUCOSAL LIFTING INJECTION;  Surgeon: Corliss Parish  Montez Hageman., MD;  Location: [facility] ENDOSCOPY;  Service: Gastroenterology;;    Social History:  reports that he quit smoking about 46 years ago. His smoking use included cigarettes. He started smoking about 51 years ago. He has a 2.5 pack-year  smoking history. He has never used smokeless tobacco. He reports that he does not drink alcohol and does not use drugs.  Allergies: No Known Allergies  Family History:  Family History  Problem Relation Age of Onset   Melanoma Mother    Stroke Father    Hypertension Father    Coronary artery disease Other    Colon cancer Neg Hx    Esophageal cancer Neg Hx    Stomach cancer Neg Hx    Rectal cancer Neg Hx    Pancreatic cancer Neg Hx    Liver disease Neg Hx    Inflammatory bowel disease Neg Hx      Current Outpatient Medications:    acetaminophen (TYLENOL) 500 MG tablet, Take 1,000 mg by mouth every 6 (six) hours as needed for moderate pain., Disp: , Rfl:    amLODipine (NORVASC) 5 MG tablet, TAKE 1 TABLET BY MOUTH DAILY, Disp: 100 tablet, Rfl: 2   aspirin EC 81 MG tablet, Take 81 mg by mouth every evening. Swallow whole., Disp: , Rfl:    cetirizine (ZYRTEC) 10 MG tablet, Take 10 mg by mouth daily as needed for allergies., Disp: , Rfl:    clopidogrel (PLAVIX) 75 MG tablet, TAKE 1 TABLET BY MOUTH ONCE  DAILY, Disp: 90 tablet, Rfl: 2   Coenzyme Q10 200 MG capsule, Take 200 mg by mouth daily., Disp: , Rfl:    cyanocobalamin (VITAMIN B12) 1000 MCG/ML injection, Inject 1,000 mcg into the skin every 30 (thirty) days., Disp: , Rfl:    ezetimibe (ZETIA) 10 MG tablet, TAKE 1 TABLET BY MOUTH DAILY, Disp: 100 tablet, Rfl: 1   Fe Bisgly-Vit C-Vit B12-FA (GENTLE IRON PO), Take by mouth. daily, Disp: , Rfl:    fluticasone (FLONASE) 50 MCG/ACT nasal spray, Place 1 spray into both nostrils daily as needed for allergies., Disp: , Rfl:    isosorbide mononitrate (IMDUR) 60 MG 24 hr tablet, TAKE 1 TABLET BY MOUTH DAILY, Disp: 90 tablet, Rfl: 2   Menthol, Topical Analgesic, (BENGAY EX), Apply 1 application topically daily as needed (pain)., Disp: , Rfl:    METAMUCIL FIBER PO, Take 1 capsule by mouth in the morning., Disp: , Rfl:    Multiple Vitamin (MULTIVITAMIN) tablet, Take 1 tablet by mouth daily.,  Disp: , Rfl:    Naphazoline-Pheniramine (OPCON-A) 0.027-0.315 % SOLN, Place 1 drop into both eyes daily as needed (itching eyes)., Disp: , Rfl:    nebivolol (BYSTOLIC) 10 MG tablet, TAKE 1 TABLET BY MOUTH DAILY, Disp: 100 tablet, Rfl: 3   NEEDLE, DISP, 25 G (B-D DISP NEEDLE 25GX1") 25G X 1" MISC, Inject 1000 mcg into muscle once a month., Disp: 50 each, Rfl: 0   nitroGLYCERIN (NITROSTAT) 0.4 MG SL tablet, DISSOLVE ONE TABLET UNDER THE TONGUE EVERY 5 MINUTES AS NEEDED FOR CHEST PAIN., Disp: 25 tablet, Rfl: 0   NON FORMULARY, Diltiazem 2%/Lidocaine5% compound Use 3 x rectally daily for 2 months to heal anal fissure, Disp: 30 g, Rfl: 1   pantoprazole (PROTONIX) 40 MG tablet, TAKE 1 TABLET BY MOUTH ONCE  DAILY, Disp: 100 tablet, Rfl: 2   polyethylene glycol powder (GLYCOLAX/MIRALAX) 17 GM/SCOOP powder, Take 1 Container by mouth daily. As needed, Disp: , Rfl:    rOPINIRole (REQUIP) 2 MG  tablet, TAKE 1 TABLET BY MOUTH AT  BEDTIME, Disp: 100 tablet, Rfl: 2   rosuvastatin (CRESTOR) 10 MG tablet, TAKE 1 TABLET BY MOUTH DAILY, Disp: 100 tablet, Rfl: 2   saccharomyces boulardii (FLORASTOR) 250 MG capsule, Take 250 mg by mouth 2 (two) times daily., Disp: , Rfl:    venlafaxine (EFFEXOR) 75 MG tablet, TAKE 1 TABLET BY MOUTH DAILY, Disp: 90 tablet, Rfl: 1   oxyCODONE-acetaminophen (PERCOCET) 10-325 MG tablet, Take 1 tablet by mouth 2 (two) times daily., Disp: 60 tablet, Rfl: 0   oxyCODONE-acetaminophen (PERCOCET) 10-325 MG tablet, Take 1 tablet by mouth 2 (two) times daily., Disp: 60 tablet, Rfl: 0   oxyCODONE-acetaminophen (PERCOCET) 10-325 MG tablet, Take 1 tablet by mouth 2 (two) times daily., Disp: 60 tablet, Rfl: 0  Review of Systems:  Negative unless indicated in HPI.   Physical Exam: Vitals:   02/11/23 0811  BP: 120/60  Pulse: 62  Temp: 97.6 F (36.4 C)  TempSrc: Oral  SpO2: 91%  Weight: 167 lb 3.2 oz (75.8 kg)    Body mass index is 24.69 kg/m.   Physical Exam Vitals reviewed.   Constitutional:      Appearance: Normal appearance.  HENT:     Head: Normocephalic and atraumatic.  Eyes:     Conjunctiva/sclera: Conjunctivae normal.  Cardiovascular:     Rate and Rhythm: Normal rate and regular rhythm.  Pulmonary:     Effort: Pulmonary effort is normal.     Breath sounds: Normal breath sounds.  Skin:    General: Skin is warm and dry.  Neurological:     General: No focal deficit present.     Mental Status: He is alert and oriented to person, place, and time.  Psychiatric:        Mood and Affect: Mood normal.        Behavior: Behavior normal.        Thought Content: Thought content normal.        Judgment: Judgment normal.      Impression and Plan:  Diabetes mellitus type 2, noninsulin dependent (HCC) -     POCT glycosylated hemoglobin (Hb A1C)  Chronic pain syndrome -     oxyCODONE-Acetaminophen; Take 1 tablet by mouth 2 (two) times daily.  Dispense: 60 tablet; Refill: 0 -     oxyCODONE-Acetaminophen; Take 1 tablet by mouth 2 (two) times daily.  Dispense: 60 tablet; Refill: 0 -     oxyCODONE-Acetaminophen; Take 1 tablet by mouth 2 (two) times daily.  Dispense: 60 tablet; Refill: 0   -Diabetes is well-controlled with an A1c of 5.7. -PDMP reviewed, no red flags, overdose risk score 110.  Refill oxycodone for 60 tablets a month x 3 months.  This is his long-term dosing, no signs of misuse.  Time spent:31 minutes reviewing chart, interviewing and examining patient and formulating plan of care.     Chaya Jan, MD [facility] at [facility]

## 2023-03-08 DIAGNOSIS — H353131 Nonexudative age-related macular degeneration, bilateral, early dry stage: Secondary | ICD-10-CM | POA: Diagnosis not present

## 2023-03-09 ENCOUNTER — Other Ambulatory Visit: Payer: Self-pay | Admitting: Internal Medicine

## 2023-03-09 ENCOUNTER — Other Ambulatory Visit: Payer: Self-pay | Admitting: Cardiovascular Disease

## 2023-03-16 ENCOUNTER — Ambulatory Visit: Payer: Medicare Other | Admitting: Physician Assistant

## 2023-03-16 ENCOUNTER — Encounter: Payer: Self-pay | Admitting: Physician Assistant

## 2023-03-16 VITALS — BP 110/70 | HR 69 | Ht 69.0 in | Wt 165.0 lb

## 2023-03-16 DIAGNOSIS — R1319 Other dysphagia: Secondary | ICD-10-CM

## 2023-03-16 DIAGNOSIS — K219 Gastro-esophageal reflux disease without esophagitis: Secondary | ICD-10-CM | POA: Diagnosis not present

## 2023-03-16 DIAGNOSIS — R1013 Epigastric pain: Secondary | ICD-10-CM | POA: Diagnosis not present

## 2023-03-16 DIAGNOSIS — K6289 Other specified diseases of anus and rectum: Secondary | ICD-10-CM

## 2023-03-16 DIAGNOSIS — R1310 Dysphagia, unspecified: Secondary | ICD-10-CM | POA: Diagnosis not present

## 2023-03-16 DIAGNOSIS — R0609 Other forms of dyspnea: Secondary | ICD-10-CM

## 2023-03-16 DIAGNOSIS — I25709 Atherosclerosis of coronary artery bypass graft(s), unspecified, with unspecified angina pectoris: Secondary | ICD-10-CM

## 2023-03-16 MED ORDER — DOXYCYCLINE HYCLATE 100 MG PO TABS: 100.0000 mg | ORAL_TABLET | Freq: Two times a day (BID) | ORAL | 0 refills | Status: DC

## 2023-03-16 NOTE — Progress Notes (Signed)
 Refill 1 if you want oh [facility]    03/16/2023 [name] [id number] [birth year]  Referring provider: Philip Aspen, [place]* Primary GI doctor: Dr. Meridee Score  ASSESSMENT AND PLAN:     Dysphagia with history of GERD Intermittent difficulty swallowing, both solids and liquids. No painful swallowing.  History of esophageal dilation 03/2022 which really did not help the patient has never had barium swallow.  Discussed with the patient be prefers to defer at this time -Continue diet modifications -May also have component of dysmotility, gastroparesis after discussion given gastroparesis diet would not suggest Reglan at this time but could potentially consider in the future versus gastric emptying study -Continue pantoprazole  Epigastric pain, with increased noises CT abdomen pelvis for abdominal discomfort unremarkable 2024 Did trial of flagyl with some help Negative pancreatic insuff Labs unremarkable Will do trial of doxycycline for possible SIBO, possible gastroparesis component with GERD and early satiety.  Given gastroparesis diet  Rectal Pain Suspect proctalgia fugax -significantly better after diltiazem, continue as needed.  -Patient has recall for colonoscopy/flex sig but currently with DOE and very high risk for endoscopic evaluation suggest deferment Follow-up 2 months  DOE progressive with sinusitis Following with Dr. Tresa Endo in cardiology Has some wheezing, watery eyes, sinus congestion Suggest follow up with pulmonary, saw Dr. Sherene Sires previously 2021 -Did doxycycline for SIBO may also help sinuses -ER precautions discussed with the patient   Patient Care Team: Philip Aspen, Limmie Patricia, MD as PCP - General (Internal Medicine) Lennette Bihari, MD as PCP - Cardiology (Cardiology) Mansouraty, Netty Starring., MD as Consulting Physician (Gastroenterology)  HISTORY OF PRESENT ILLNESS: 83 y.o. male with a past medical history of  depression, hypertension, coronary  artery disease s/p 2 vessel CABG 2001, s/p DES 12/2018, COPD, sleep apnea does not use CPAP, diabetes mellitus type II, restless leg syndrome, bladder cancer, GERD, Barrett's esophagus, choledocholithiasis with a distal  biliary stricture s/p multiple ERCPs with stone extraction and 3 biliary stent placement with subsequent bilary stent removal 02/2020, prior pancreatitis secondary to gallstone disease and history of elevated Ig4 level. S/P cholecystectomy 12/2018 and others listed below presents for evaluation of recall colon.   May 12, 2022. EF was 50 to 55% without regional wall motion abnormalities. There was mild aortic sclerosis without stenosis. There is very mild dilation of ascending aorta at 39 mm. On May 18, 2022 he underwent a pharmacologic Lexiscan Myoview study which showed findings consistent with a fixed perfusion defect in the inferior basal to mid wall consistent with scar. There was no ischemia.  He underwent robotic assisted laparoscopic right nephro ureterectomy and treatment of high-grade upper pole renal cancer by Dr. Berneice Heinrich on May 17. 2024.   12/29/2017 colonoscopy 3 mm polyp descending colon, diverticulosis, TA polyp, no dysplasia 09/12/2018 EGD which showed Barrett's esophagus without dysplasia, gastritis and a hyperplastic gastric polyp.  No evidence of H. pylori.  The esophagus was dilated.  04/10/2022 EGD and flex sigmoidoscopy  Flex sig had poor bowel prep, - Preparation of the colon was poor. - Hemorrhoids found on digital rectal exam. - Two 6 to 8 mm polyps in the transverse colon, removed with a cold snare. Resected and retrieved. - Stool in the descending colon, at the splenic flexure and in the transverse colon. - Normal mucosa in the rectum, in the recto- sigmoid colon, in the sigmoid colon and in the descending colon. Biopsied. - Non- bleeding non- thrombosed external and internal hemorrhoids. Recommended 1 year follow up   Patient seen in  the office December of this  past year for abdominal discomfort with associated weight loss had CT abdomen pelvis with no acute pathology did show sigmoid diverticulosis without diverticulitis patient had negative pancreatic elastase, fecal fat, sed rate, thyroid.   Patient had worsening BUN and creatinine told to follow-up primary care also had slightly increased potassium stable hemoglobin. Patient was given trial of Flagyl, FODMAP, treated rectal pain for suspected proctalgia fugax with diltiazem suggested adding on fiber supplement.  Patient here for follow-up. Consider colonoscopy but patient high risk.  Discussed the use of AI scribe software for clinical note transcription with the patient, who gave verbal consent to proceed.  History of Present Illness   [name] "[name]" is a 83 year old male with a history of hiatal hernia and potential Barrett's esophagus who presents with gastrointestinal symptoms and shortness of breath.  He has a history of gastrointestinal issues, including a hiatal hernia and potential Barrett's esophagus identified during an endoscopy. He experiences intermittent swallowing difficulties with both solids and liquids, ongoing since his twenties and progressively worsening. The sensation is described as discomfort rather than pain, alleviated by eating small amounts like a cracker or candy. Previous treatments, including dilation procedures, have not provided significant improvement. He also reports nausea and discomfort, particularly in the mornings, which improves after eating. No significant changes in bowel movements are noted, but there are ongoing abdominal noises and discomfort. He has experienced significant weight loss, losing over thirty pounds, but his weight has stabilized recently. He maintains a diet of small portions and reports a reduced appetite.  He has a history of shortness of breath, progressively worsening since a heart attack and stent placement in 2020. Significant  breathlessness occurs with minimal exertion, such as walking to the mailbox or climbing stairs, and bending over exacerbates the issue. He denies being on oxygen therapy and has not had recent pulmonary evaluations. A history of heart bypass surgery in 2001 is noted, with shortness of breath present since before the heart attack. No wheezing is reported, but there is shortness of breath with exertion.  He describes chronic sinus congestion and mucus drainage from his head, worsening over the past two to three years. Daily headaches, particularly in the afternoon, are reported, along with constant chills and difficulty warming up. Watery eyes and changes in vision are attributed to congestion. Previous antibiotic treatments have not provided relief.  He reports rectal pain, managed with Vaseline, which is more effective than prescribed medication. The pain is less bothersome when not lying down or sitting for extended periods, and some discomfort is attributed to heat from his mattress.       Wt Readings from Last 5 Encounters:  03/16/23 165 lb (74.8 kg)  02/11/23 167 lb 3.2 oz (75.8 kg)  12/28/22 164 lb (74.4 kg)  12/01/22 160 lb 9.6 oz (72.8 kg)  11/12/22 164 lb 9.6 oz (74.7 kg)     He  reports that he quit smoking about 46 years ago. His smoking use included cigarettes. He started smoking about 51 years ago. He has a 2.5 pack-year smoking history. He has never used smokeless tobacco. He reports that he does not drink alcohol and does not use drugs.   MOST RECENT GI PROCEDURES: 01/01/2023 CT AB and pelvis WITHOUT due to kidney function IMPRESSION: 1. No acute intra-abdominal or pelvic pathology. 2. Sigmoid diverticulosis. No bowel obstruction. Normal appendix. 3. Status post prior right nephrectomy. 4.  Aortic Atherosclerosis (ICD10-I70.0).  EGD 04/10/2022  -  Salmon- colored mucosal tongue suspicious for short- segment Barrett' s esophagus found distally. Biopsied. - Z- line irregular,  40 cm from the incisors. - 1 cm hiatal hernia. - Dilation performed in the esophagus with savory 16 mm and 18 mm with mucosal wrent noted at the GE junction. - Erythematous mucosa in the antrum. No other gross lesions in the entire stomach. Biopsied. - Duodenal angulation deformity in the duodenal sweep. No other gross mucosal lesions in the duodenal bulb, in the first portion of the duodenum and in the second portion of the duodenum. Biopsied. 1. Surgical [P], gastric antrum and gastric body - ANTRAL AND OXYNTIC MUCOSA WITH NO SIGNIFICANT PATHOLOGY. - NO HELICOBACTER PYLORI ORGANISMS IDENTIFIED ON H&E STAINED SLIDE. 2. Surgical [P], duodenal bulb, 2nd portion of duodenum, and distal duodenum - DUODENAL MUCOSA WITH PROMINENT BRUNNER'S GLANDS, OTHERWISE NO SIGNIFICANT PATHOLOGY. 3. Surgical [P], distal esophagus - SQUAMOCOLUMNAR JUNCTIONAL MUCOSA WITH MILD CHANGES CONSISTENT WITH REFLUX. - NEGATIVE FOR INTESTINAL METAPLASIA. 4. Surgical [P], random esophagus biopsies - SQUAMOUS MUCOSA WITH INCREASED INTRAEPITHELIAL LYMPHOCYTES Flexible sigmoidoscopy 04/10/2022 - Preparation of the colon was poor. - Hemorrhoids found on digital rectal exam. - Two 6 to 8 mm polyps in the transverse colon, removed with a cold snare. Resected and retrieved. - Stool in the descending colon, at the splenic flexure and in the transverse colon. - Normal mucosa in the rectum, in the recto- sigmoid colon, in the sigmoid colon and in the descending colon. Biopsied. - Non- bleeding non- thrombosed external and internal hemorrhoids.  TA polyp and hyperplastic polyp. PATH: 1. Surgical [P], gastric antrum and gastric body - ANTRAL AND OXYNTIC MUCOSA WITH NO SIGNIFICANT PATHOLOGY. - NO HELICOBACTER PYLORI ORGANISMS IDENTIFIED ON H&E STAINED SLIDE. 2. Surgical [P], duodenal bulb, 2nd portion of duodenum, and distal duodenum - DUODENAL MUCOSA WITH PROMINENT BRUNNER'S GLANDS, OTHERWISE NO SIGNIFICANT PATHOLOGY. 3. Surgical [P], distal  esophagus - SQUAMOCOLUMNAR JUNCTIONAL MUCOSA WITH MILD CHANGES CONSISTENT WITH REFLUX. - NEGATIVE FOR INTESTINAL METAPLASIA. 4. Surgical [P], random esophagus biopsies - SQUAMOUS MUCOSA WITH INCREASED INTRAEPITHELIAL LYMPHOCYTES. NOTE: THE ABOVE FINDINGS ARE NONSPECIFIC AND HAVE BEEN ASSOCIATED WITH CANDIDIASIS (A PAS STAIN IS PENDING AND WILL BE REPORTED IN AN ADDENDUM), DYSMOTILITY DISORDERS/ACHALASIA, AS WELL AS CROHN'S DISEASE (TYPICALLY IN THE YOUNGER NATURE) AMONGST OTHERS. 5. Surgical [P], colon, transverse, sigmoid, polyp (3) - TUBULAR ADENOMA, FRAGMENTS. - HYPERPLASTIC POLYP. 6. Surgical [P], colon nos, random sites - COLONIC MUCOSA WITHIN NORMAL LIMITS. 7. Surgical [P], colon, rectum - COLONIC MUCOSA WITHIN NORMAL LIMITS.  ERCP 06/03/2020: - Prior biliary sphincterotomy appeared open. - The entire main bile duct was moderately dilated. - A filling defect was noted that was felt to be more likely air rather than stone on fluoroscopy. - The biliary tree was swept and very small amount of sludge was found. Stones were not removed. - The filling defect looked to have been removed completely, but the size of the previous stone on CT scan made me feel need to ensure it was gone. SpyGlass interrogation was performed. There was mild narrowing in the distal CBD, but the majority of the rest of the biliary tract had normal mucosa. - Incidental mucosal tear noted after Duodenoscope/SpyScope positioning loss in the D1/D2 angle. Five hemostatic clips were successfully placed (MR conditional) to close this defect.   ERCP 03/11/2020: - No gross lesions in esophagus proximally. Salmon-colored mucosal islands suspicious for short-segment Barrett's esophagus - biopsied. - Erythematous mucosa in the antrum. No other gross lesions in the stomach. Biopsied. -  No gross lesions in the duodenal bulb, in the first portion of the duodenum and in the second portion of the duodenum. - Prior biliary  sphincterotomy appeared open. Three stents from the biliary tree were seen in the major papilla - these were removed and sent for cytology. - The fluoroscopic examination was suspicious for sludge as well as biliary dilation in the main duct. - A fine tapering was noted distally. Not clearly a stricture but could be narrowing. This  was dilated and brushed for cytology. - Choledocholithiasis was found. Complete removal was accomplished by sweeping. - At end of procedure, the biliary tree was swept and nothing was found. - Pull through of the 12 mm balloon through the entire duct was felt to be adequate so I did not replace the previous plastic stents for a trial of how the patient does without stenting in regards to LFT pattern and overall clinical scenario.   ERCP 11/28/2018: second portion of the duodenum. - Prior biliary sphincterotomy appeared open. - Two visibly patent stents from the biliary tree were seen in the major papilla. These were removed and sent for cytology. - The fluoroscopic examination was suspicious for sludge/debris. - A single localized biliary stricture was found in the lower third of the main bile duct. The stricture was indeterminate. This was dilated. This was brushed for cytology. - Choledocholithiasis and biliary sludge was found. Complete removal was accomplished by sweeping on multiple occasions. Occlusion cholangiogram showed a clear duct at end of procedure. - Moderately narrowed lumen in the biliary tract visualized via SpyGlass. This was biopsied. - Three plastic biliary stents were placed into the common bile duct to aid in dilation of the narrowing. - Overall, suspect this is more an inflammatory stricture/stenosis from stone disease. The consideration of an IgG4 mediated process remains. However, there is no doubt that the patient continues to drop stones/sludge from the gallbladder into the duct as evidenced on last 2 ERCPs of significant stone/debris  being removed   EGD/EUS/ERCP 09/12/2018: - Prior biliary sphincterotomy appeared open. - Filling defects consistent with stones and sludge were seen on the cholangiogram. - A single moderate biliary stricture was found in the lower third of the main bile duct. The stricture was indeterminate. This stricture was dilated. This stricture was brushed for cytology. Two plastic biliary stents were placed into the common bile duct to traverse the stricture. - The entire main bile duct was severely dilated, secondary to aforementioned stricture. - Choledocholithiasis and clot/debris was found. Complete removal was accomplished by sweeping after sphincteroplasty. EGD Impression: - No gross lesions in proximal/middle esophagus. Salmon-colored mucosa suspicious for short-segment Barrett's esophagus. Biopsied. Dilation of esophagus performed. - Gastritis. Biopsied. - A single gastric polyp. Resected and retrieved via mucosal resection. Clips (MR conditional) were placed to close the defect. - No gross lesions in the duodenal bulb, in the first portion of the duodenum and in the second portion of the duodenum. Biopsied for enteropathy/celiac rule out. - Plastic biliary stent in the duodenum. Removed. - Patent sphincterotomy was found. EUS Impression: - There was a suggestion of a stricture in the lower third of the main bile duct. Fine needle biopsy performed of the region (although no overt mass was noted significant thickening present). - Multiple stones were visualized endosonographically in the common bile duct. - There was dilation in the common bile duct and in the common hepatic duct which measured up to 17 mm. - The pancreatic duct had a normal endosonographic appearance in the  pancreatic head, genu of the pancreas, body of the pancreas and tail of the pancreas. - Pancreatic parenchymal abnormalities consisting of diffuse echogenicity and lobularity were noted in the entire pancreas. - No  malignant-appearing lymph nodes were visualized in the celiac region (level 20), peripancreatic region and porta hepatis region. 1. Duodenum, Biopsy - BENIGN SMALL BOWEL MUCOSA. - NO ACTIVE INFLAMMATION OR VILLOUS ATROPHY IDENTIFIED. 2. Stomach, biopsy - CHRONIC INACTIVE GASTRITIS. - THERE IS NO EVIDENCE OF HELICOBACTER PYLORI, DYSPLASIA OR MALIGNANCY. - SEE COMMENT. 3. Esophagus, biopsy - INTESTINAL METAPLASIA (GOBLET CELL METAPLASIA) CONSISTENT WITH BARRETT'S ESOPHAGUS. - THERE IS NO EVIDENCE OF DYSPLASIA OR MALIGNANCY. 4. Esophagus, biopsy, random - BENIGN SQUAMOUS MUCOSA. - THERE IS NO EVIDENCE OF SIGNIFICANT INFLAMMATION, DYSPLASIA, OR MALIGNANCY. 5. Stomach, biopsy, pre-pyloric - ULCERATED HYPERPLASTIC POLYP(S). - THERE IS NO EVIDENCE OF MALIGNANCY   EUS/ERCP 04/01/2018:   EGD Impression: - White nummular lesions on esophageal mucosa. Biopsied for Candida rule out. - Gastric mucosal atrophy at fundus. Biopsied. - Non-bleeding erosive gastropathy. Biopsied for HP. - Congested, erythematous, friable (with contact bleeding), granular, inflamed and texture changed mucosa in the pylorus. Biopsied to rule out adenoma or other processes.. - No gross lesions in the duodenal bulb, in the first portion of the duodenum and in the second portion of the duodenum. - Ampulla was flat. No bile was able to be suctioned out. EUS Impression: - Pancreatic parenchymal abnormalities consisting of diffuse echogenicity were noted in the genu of the pancreas, pancreatic body and pancreatic tail. The head of the pancreas due to the heterogeneity was not grossly abnormal however, the chance of missing subtle lesions with decreased sensitivity in setting of acute pancreatitis is possible. - The pancreatic duct had a normal endosonographic appearance in the genu of the pancreas, body of the pancreas and tail of the pancreas. - There was dilation in the common bile duct and in the common hepatic duct. -  There was a suggestion of a stricture in the lower third of the main bile duct. - Hyperechoic material consistent with sludge was visualized endosonographically in the common bile duct above the likely stricture. - Hyperechoic material consistent with sludge was visualized endosonographically in the gallbladder - The major papilla appeared to be flat. - The fluoroscopic examination was suspicious for sludge. - A single moderate biliary stricture was found in the lower third of the main bile duct. The stricture was indeterminate. This was brushed for cytology. - A biliary sphincterotomy was performed. - The biliary tree was swept and sludge and debris were found. - One plastic biliary stent was placed into the common bile duct and allowed good drainage at the end of the procedure of the biliary tree suggestive that stenosis was significant.   Colonoscopy 12/29/2017: - One 3 mm polyp in the descending colon, removed with a cold snare. Resected and retrieved. - Diverticulosis in the sigmoid colon and in the ascending colon. - The examination was otherwise normal. No cause for pain noted on this exam. 1. Surgical [P], gastric cardia nodule - HYPERPLASTIC POLYP(S). - THERE IS NO EVIDENCE OF MALIGNANCY. 2. Surgical [P], GE junction - GASTROESOPHAGEAL JUNCTION MUCOSA WITH MILD INFLAMMATION CONSISTENT WITH GASTROESOPHAGEAL REFLUX. - THERE IS NO EVIDENCE OF GOBLET CELL METAPLASIA, DYSPLASIA, OR MALIGNANCY. 3. Surgical [P], descending colon, polyp - TUBULAR ADENOMA(S). - HIGH GRADE DYSPLASIA IS NOT IDENTIFIED  RELEVANT LABS AND IMAGING:  CBC    Component Value Date/Time   WBC 7.5 12/28/2022 1214   RBC 4.25 12/28/2022 1214  HGB 12.3 (L) 12/28/2022 1214   HGB 13.7 12/09/2020 0853   HGB 13.0 04/25/2007 1127   HCT 36.9 (L) 12/28/2022 1214   HCT 40.5 12/09/2020 0853   HCT 37.0 (L) 04/25/2007 1127   PLT 190.0 12/28/2022 1214   PLT 161 12/09/2020 0853   MCV 86.8 12/28/2022 1214   MCV 87  12/09/2020 0853   MCV 89.0 04/25/2007 1127   MCH 28.3 05/22/2022 0934   MCHC 33.2 12/28/2022 1214   RDW 14.2 12/28/2022 1214   RDW 13.0 12/09/2020 0853   RDW 13.8 04/25/2007 1127   LYMPHSABS 2.3 12/28/2022 1214   LYMPHSABS 2.5 04/25/2007 1127   MONOABS 0.7 12/28/2022 1214   MONOABS 0.6 04/25/2007 1127   EOSABS 0.4 12/28/2022 1214   EOSABS 0.0 04/25/2007 1127   BASOSABS 0.1 12/28/2022 1214   BASOSABS 0.0 04/25/2007 1127   Recent Labs    03/25/22 0855 04/29/22 1330 05/22/22 0934 06/02/22 1000 06/03/22 1539 06/04/22 0503 12/28/22 1214  HGB 13.3 12.5* 12.6* 13.1 12.2* 10.3* 12.3*    CMP     Component Value Date/Time   NA 143 12/28/2022 1214   NA 144 12/09/2020 0853   K 5.3 No hemolysis seen (H) 12/28/2022 1214   CL 107 12/28/2022 1214   CO2 27 12/28/2022 1214   GLUCOSE 99 12/28/2022 1214   BUN 35 (H) 12/28/2022 1214   BUN 21 12/09/2020 0853   CREATININE 2.60 (H) 12/28/2022 1214   CREATININE 1.61 (H) 11/09/2019 0939   CALCIUM 8.8 12/28/2022 1214   PROT 7.0 12/28/2022 1214   PROT 7.2 12/09/2020 0853   ALBUMIN 4.0 12/28/2022 1214   ALBUMIN 4.2 12/09/2020 0853   AST 16 12/28/2022 1214   ALT 7 12/28/2022 1214   ALKPHOS 66 12/28/2022 1214   BILITOT 0.3 12/28/2022 1214   BILITOT 0.4 12/09/2020 0853   GFRNONAA 30 (L) 06/04/2022 0503   GFRAA 47 (L) 11/13/2019 1006      Latest Ref Rng & Units 12/28/2022   12:14 PM 06/02/2022   10:00 AM 05/22/2022    9:34 AM  Hepatic Function  Total Protein 6.0 - 8.3 g/dL 7.0  6.8  7.3   Albumin 3.5 - 5.2 g/dL 4.0  3.9  3.8   AST 0 - 37 U/L 16  16  18    ALT 0 - 53 U/L 7  7  11    Alk Phosphatase 39 - 117 U/L 66  58  56   Total Bilirubin 0.2 - 1.2 mg/dL 0.3  0.5  0.6       Current Medications:    Current Outpatient Medications (Cardiovascular):    amLODipine (NORVASC) 5 MG tablet, TAKE 1 TABLET BY MOUTH DAILY   ezetimibe (ZETIA) 10 MG tablet, TAKE 1 TABLET BY MOUTH DAILY   isosorbide mononitrate (IMDUR) 60 MG 24 hr tablet, TAKE 1  TABLET BY MOUTH DAILY   nebivolol (BYSTOLIC) 10 MG tablet, TAKE 1 TABLET BY MOUTH DAILY   rosuvastatin (CRESTOR) 10 MG tablet, TAKE 1 TABLET BY MOUTH DAILY   nitroGLYCERIN (NITROSTAT) 0.4 MG SL tablet, DISSOLVE ONE TABLET UNDER THE TONGUE EVERY 5 MINUTES AS NEEDED FOR CHEST PAIN. (Patient not taking: Reported on 03/16/2023)  Current Outpatient Medications (Respiratory):    cetirizine (ZYRTEC) 10 MG tablet, Take 10 mg by mouth daily as needed for allergies.   fluticasone (FLONASE) 50 MCG/ACT nasal spray, Place 1 spray into both nostrils daily as needed for allergies.  Current Outpatient Medications (Analgesics):    acetaminophen (TYLENOL) 500 MG tablet, Take  1,000 mg by mouth every 6 (six) hours as needed for moderate pain.   aspirin EC 81 MG tablet, Take 81 mg by mouth every evening. Swallow whole.   oxyCODONE-acetaminophen (PERCOCET) 10-325 MG tablet, Take 1 tablet by mouth 2 (two) times daily.   oxyCODONE-acetaminophen (PERCOCET) 10-325 MG tablet, Take 1 tablet by mouth 2 (two) times daily.   oxyCODONE-acetaminophen (PERCOCET) 10-325 MG tablet, Take 1 tablet by mouth 2 (two) times daily.  Current Outpatient Medications (Hematological):    clopidogrel (PLAVIX) 75 MG tablet, TAKE 1 TABLET BY MOUTH ONCE  DAILY   cyanocobalamin (VITAMIN B12) 1000 MCG/ML injection, INJECT 1 ML SUBCUTANEOUSLY ONCE EVERY MONTH   Fe Bisgly-Vit C-Vit B12-FA (GENTLE IRON PO), Take by mouth. daily  Current Outpatient Medications (Other):    Coenzyme Q10 200 MG capsule, Take 200 mg by mouth daily.   doxycycline (VIBRA-TABS) 100 MG tablet, Take 1 tablet (100 mg total) by mouth 2 (two) times daily.   Menthol, Topical Analgesic, (BENGAY EX), Apply 1 application topically daily as needed (pain).   METAMUCIL FIBER PO, Take 1 capsule by mouth in the morning.   Multiple Vitamin (MULTIVITAMIN) tablet, Take 1 tablet by mouth daily.   Naphazoline-Pheniramine (OPCON-A) 0.027-0.315 % SOLN, Place 1 drop into both eyes daily as  needed (itching eyes).   NEEDLE, DISP, 25 G (B-D DISP NEEDLE 25GX1") 25G X 1" MISC, Inject 1000 mcg into muscle once a month.   NON FORMULARY, Diltiazem 2%/Lidocaine5% compound Use 3 x rectally daily for 2 months to heal anal fissure   pantoprazole (PROTONIX) 40 MG tablet, TAKE 1 TABLET BY MOUTH ONCE  DAILY   polyethylene glycol powder (GLYCOLAX/MIRALAX) 17 GM/SCOOP powder, Take 1 Container by mouth daily. As needed   prednisoLONE acetate (PRED FORTE) 1 % ophthalmic suspension, Place 1 drop into both eyes in the morning, at noon, and at bedtime.   rOPINIRole (REQUIP) 2 MG tablet, TAKE 1 TABLET BY MOUTH AT  BEDTIME   saccharomyces boulardii (FLORASTOR) 250 MG capsule, Take 250 mg by mouth 2 (two) times daily.   venlafaxine (EFFEXOR) 75 MG tablet, TAKE 1 TABLET BY MOUTH DAILY  Medical History:  Past Medical History:  Diagnosis Date   Allergy    Anxiety    B12 deficiency anemia    Blood transfusion without reported diagnosis    CAD (coronary artery disease)    Cancer (HCC)    bladder, Right renal cell also   Chronic kidney disease    Colon polyps    COPD (chronic obstructive pulmonary disease) (HCC)    Depression    Diabetes mellitus (HCC)    Dyspnea    Esophagus, Barrett's    GERD (gastroesophageal reflux disease)    Headache    History of bladder cancer    Bladder cancer "8 times"   History of hiatal hernia    Hyperlipidemia    Hypertension    Localized osteoarthrosis, lower leg    Myocardial infarction (HCC) 2021   Pneumonia    Restless leg syndrome    Sleep apnea    does not wear cpap   Stenosis of esophagus    Allergies: No Known Allergies   Surgical History:  He  has a past surgical history that includes Coronary artery bypass graft; Cervical discectomy; Lumbar laminectomy; bladder cancer; Popliteal synovial cyst excision; Knee arthroscopy (Left); Esophagogastroduodenoscopy (04/29/2010); Colonoscopy (11/17/2005); I & D extremity (Left, 11/24/2016); Nasal sinus surgery;  Endoscopic retrograde cholangiopancreatography (ercp) with propofol (N/A, 04/01/2018); biopsy (04/01/2018); sphincterotomy (04/01/2018); Biliary brushing (04/01/2018);  biliary stent placement (04/01/2018); Esophagogastroduodenoscopy (egd) with propofol (N/A, 04/01/2018); EUS (04/01/2018); removal of stones (04/01/2018); Esophagogastroduodenoscopy (egd) with propofol (N/A, 09/12/2018); EUS (N/A, 09/12/2018); Endoscopic retrograde cholangiopancreatography (ercp) with propofol (N/A, 09/12/2018); Stent removal (09/12/2018); biopsy (09/12/2018); Fine needle aspiration (09/12/2018); Biliary dilation (09/12/2018); biliary stent placement (09/12/2018); Biliary brushing (09/12/2018); removal of stones (09/12/2018); Endoscopic mucosal resection (09/12/2018); Submucosal lifting injection (09/12/2018); Hemostasis clip placement (09/12/2018); Savory dilation (N/A, 09/12/2018); ERCP (N/A, 11/28/2018); Spyglass cholangioscopy (N/A, 11/28/2018); Savory dilation (N/A, 11/28/2018); Esophagogastroduodenoscopy (egd) with propofol (N/A, 11/28/2018); Stent removal (11/28/2018); Biliary dilation (11/28/2018); biopsy (11/28/2018); Biliary brushing (11/28/2018); removal of stones (11/28/2018); biliary stent placement (11/28/2018); LEFT HEART CATH AND CORS/GRAFTS ANGIOGRAPHY (N/A, 01/05/2019); CORONARY STENT INTERVENTION (N/A, 01/05/2019); Endoscopic retrograde cholangiopancreatography (ercp) with propofol (N/A, 03/11/2020); Esophagogastroduodenoscopy (egd) with propofol (N/A, 03/11/2020); biopsy (03/11/2020); Stent removal (03/11/2020); removal of stones (03/11/2020); Biliary dilation (03/11/2020); Biliary brushing (03/11/2020); Hand surgery (Left, 2018); Endoscopic retrograde cholangiopancreatography (ercp) with propofol (N/A, 06/03/2020); Hemostasis clip placement (06/03/2020); Spyglass cholangioscopy (N/A, 06/03/2020); removal of stones (06/03/2020); Esophagogastroduodenoscopy (egd) with propofol (N/A, 06/03/2020); Septoplasty  (Bilateral, 05/26/2021); Cholecystectomy; Cystoscopy with retrograde pyelogram, ureteroscopy and stent placement (Right, 03/27/2022); Cystoscopy with biopsy (N/A, 03/27/2022); and Robot assited laparoscopic nephroureterectomy (Right, 06/03/2022). Family History:  His family history includes Coronary artery disease in an other family member; Hypertension in his father; Melanoma in his mother; Stroke in his father.  REVIEW OF SYSTEMS  : All other systems reviewed and negative except where noted in the History of Present Illness.  PHYSICAL EXAM: BP 110/70 (BP Location: Left Arm, Patient Position: Sitting, Cuff Size: Normal)   Pulse 69   Ht 5\' 9"  (1.753 m)   Wt 165 lb (74.8 kg)   BMI 24.37 kg/m  General Appearance: Well nourished, in no apparent distress. Head:   Normocephalic and atraumatic. Eyes:  sclerae anicteric,conjunctive pink  Respiratory: Respiratory effort normal, BS equal bilaterally with wheezing, without rales, rhonchi, Cardio: RRR with no MRGs. Peripheral pulses intact.  Abdomen: Soft,  Obese ,active bowel sounds.  Mild epigastric tenderness. Without guarding and Without rebound. No masses. Rectal: Not evaluated Musculoskeletal: Full ROM, Normal gait. Without edema. Skin:  Dry and intact without significant lesions or rashes Neuro: Alert and  oriented x4;  No focal deficits. Psych:  Cooperative. Normal mood and affect.    Doree Albee, PA-C 12:32 PM

## 2023-03-16 NOTE — Patient Instructions (Addendum)
 Can do vaseline or desitin as needed for rectum Can use diltaizem as needed  Follow up with pulmonary for shortness of breath, will put in referral.  Do the doxycycline twice a day for 10 days for your sinuses, and your stomach.   For the AB pain, try the gastroparesis diet below Consider UGI series versus gastric emptying study  Gastroparesis Please do small frequent meals like 4-6 meals a day.  Eat and drink liquids at separate times.  Avoid high fiber foods, cook your vegetables, avoid high fat food.  Suggest spreading protein throughout the day (greek yogurt, glucerna, soft meat, milk, eggs) Choose soft foods that you can mash with a fork When you are more symptomatic, change to pureed foods foods and liquids.  Consider reading "Living well with Gastroparesis" by Reuel Derby Gastroparesis is a condition in which food takes longer than normal to empty from the stomach. This condition is also known as delayed gastric emptying. It is usually a long-term (chronic) condition. There is no cure, but there are treatments and things that you can do at home to help relieve symptoms. Treating the underlying condition that causes gastroparesis can also help relieve symptoms What are the causes? In many cases, the cause of this condition is not known. Possible causes include: A hormone (endocrine) disorder, such as hypothyroidism or diabetes. A nervous system disease, such as Parkinson's disease or multiple sclerosis. Cancer, infection, or surgery that affects the stomach or vagus nerve. The vagus nerve runs from your chest, through your neck, and to the lower part of your brain. A connective tissue disorder, such as scleroderma. Certain medicines. What increases the risk? You are more likely to develop this condition if: You have certain disorders or diseases. These may include: An endocrine disorder. An eating disorder. Amyloidosis. Scleroderma. Parkinson's disease. Multiple  sclerosis. Cancer or infection of the stomach or the vagus nerve. You have had surgery on your stomach or vagus nerve. You take certain medicines. You are male. What are the signs or symptoms? Symptoms of this condition include: Feeling full after eating very little or a loss of appetite. Nausea, vomiting, or heartburn. Bloating of your abdomen. Inconsistent blood sugar (glucose) levels on blood tests. Unexplained weight loss. Acid from the stomach coming up into the esophagus (gastroesophageal reflux). Sudden tightening (spasm) of the stomach, which can be painful. Symptoms may come and go. Some people may not notice any symptoms. How is this diagnosed? This condition is diagnosed with tests, such as: Tests that check how long it takes food to move through the stomach and intestines. These tests include: Upper gastrointestinal (GI) series. For this test, you drink a liquid that shows up well on X-rays, and then X-rays are taken of your intestines. Gastric emptying scintigraphy. For this test, you eat food that contains a small amount of radioactive material, and then scans are taken. Wireless capsule GI monitoring system. For this test, you swallow a pill (capsule) that records information about how foods and fluid move through your stomach. Gastric manometry. For this test, a tube is passed down your throat and into your stomach to measure electrical and muscular activity. Endoscopy. For this test, a long, thin tube with a camera and light on the end is passed down your throat and into your stomach to check for problems in your stomach lining. Ultrasound. This test uses sound waves to create images of the inside of your body. This can help rule out gallbladder disease or pancreatitis as a cause of  your symptoms. How is this treated? There is no cure for this condition, but treatment and home care may relieve symptoms. Treatment may include: Treating the underlying cause. Managing your  symptoms by making changes to your diet and exercise habits. Taking medicines to control nausea and vomiting and to stimulate stomach muscles. Getting food through a feeding tube in the hospital. This may be done in severe cases. Having surgery to insert a device called a gastric electrical stimulator into your body. This device helps improve stomach emptying and control nausea and vomiting. Follow these instructions at home: Take over-the-counter and prescription medicines only as told by your health care provider. Follow instructions from your health care provider about eating or drinking restrictions. Your health care provider may recommend that you: Eat smaller meals more often. Eat low-fat foods. Eat low-fiber forms of high-fiber foods. For example, eat cooked vegetables instead of raw vegetables. Have only liquid foods instead of solid foods. Liquid foods are easier to digest. Drink enough fluid to keep your urine pale yellow. Exercise as often as told by your health care provider. Keep all follow-up visits. This is important. Contact a health care provider if you: Notice that your symptoms do not improve with treatment. Have new symptoms. Get help right away if you: Have severe pain in your abdomen that does not improve with treatment. Have nausea that is severe or does not go away. Vomit every time you drink fluids. Summary Gastroparesis is a long-term (chronic) condition in which food takes longer than normal to empty from the stomach. Symptoms include nausea, vomiting, heartburn, bloating of your abdomen, and loss of appetite. Eating smaller portions, low-fat foods, and low-fiber forms of high-fiber foods may help you manage your symptoms. Get help right away if you have severe pain in your abdomen. This information is not intended to replace advice given to you by your health care provider. Make sure you discuss any questions you have with your health care provider. Document  Revised: 05/15/2019 Document Reviewed: 05/15/2019 Elsevier Patient Education  2021 [employer]   Small intestinal bacterial overgrowth (SIBO) occurs when there is an abnormal increase in the overall bacterial population in the small intestine -- particularly types of bacteria not commonly found in that part of the digestive tract. Small intestinal bacterial overgrowth (SIBO) commonly results when a circumstance -- such as surgery or disease -- slows the passage of food and waste products in the digestive tract, creating a breeding ground for bacteria.  Signs and symptoms of SIBO often include: Loss of appetite Abdominal pain Nausea Bloating An uncomfortable feeling of fullness after eating Diarrhea or constipation, depending on the type of gas produced  What foods trigger SIBO? While foods aren't the original cause of SIBO, certain foods do encourage the overgrowth of the wrong bacteria in your small intestine. If you're feeding them their favorite foods, they're going to grow more, and that will trigger more of your SIBO symptoms. By the same token, you can help reduce the overgrowth by starving the problematic bacteria of their favorite foods. This strategy has led to a number of proposed SIBO eating plans. The plans vary, and so do individual results. But in general, they tend to recommend limiting carbohydrates.  These include: Sugars and sweeteners. Fruits and starchy vegetables. Dairy products. Grains.  There is a test for this we can do called a breath test, if you are positive we will treat you with an antibiotic to see if it helps.  Your symptoms are  very suspicious for this condition, as discussed, we will start you on an antibiotic to see if this helps.

## 2023-03-16 NOTE — Progress Notes (Signed)
 Attending Physician's Attestation   I have reviewed the chart.   I agree with the Advanced Practitioner's note, impression, and recommendations with any updates as below.    Corliss Parish, MD [facility] Office # [phone]

## 2023-03-22 DIAGNOSIS — H04123 Dry eye syndrome of bilateral lacrimal glands: Secondary | ICD-10-CM | POA: Diagnosis not present

## 2023-03-22 DIAGNOSIS — H353131 Nonexudative age-related macular degeneration, bilateral, early dry stage: Secondary | ICD-10-CM | POA: Diagnosis not present

## 2023-04-23 ENCOUNTER — Ambulatory Visit: Payer: Medicare Other | Admitting: Physician Assistant

## 2023-04-23 NOTE — Progress Notes (Deleted)
 Refill 1 if you want oh [facility]    04/23/2023 [name] [id number] 10-16-1940  Referring provider: Philip Aspen, [place]* Primary GI doctor: Dr. Meridee Score  ASSESSMENT AND PLAN:     Dysphagia with history of GERD Intermittent difficulty swallowing, both solids and liquids. No painful swallowing.  S/p esophageal dilation 03/2022 which really did not help, consider barium swallow -Continue diet modifications -consider GES study -Continue pantoprazole  Epigastric pain, with increased noises CT abdomen pelvis for abdominal discomfort unremarkable 12/2022 Did trial of flagyl with some help, doxycycline *** Negative pancreatic insuff   Rectal Pain Suspect proctalgia fugax -significantly better after diltiazem, continue as needed.  -Patient has recall for colonoscopy/flex sig but currently with DOE and very high risk for endoscopic evaluation suggest deferment Follow-up 2 months  CAD Following with Dr. Tresa Endo in cardiology May 12, 2022. EF was 50 to 55% without regional wall motion abnormalities. There was mild aortic sclerosis without stenosis. There is very mild dilation of ascending aorta at 39 mm. pril 29, 2024 he underwent a pharmacologic Lexiscan Myoview study which showed findings consistent with a fixed perfusion defect in the inferior basal to mid wall consistent with scar. There was no ischemia.  -ER precautions discussed with the patient  History of right renal cancer S/p robotic assisted laparoscopic right nephro ureterectomy and treatment of high-grade upper pole renal cancer by Dr. Berneice Heinrich on May 17. 2024.  Patient Care Team: Philip Aspen, Limmie Patricia, MD as PCP - General (Internal Medicine) Lennette Bihari, MD as PCP - Cardiology (Cardiology) Mansouraty, Netty Starring., MD as Consulting Physician (Gastroenterology)  HISTORY OF PRESENT ILLNESS: 83 y.o. male with a past medical history of  depression, hypertension, coronary artery disease s/p 2 vessel CABG  2001, s/p DES 12/2018, COPD, sleep apnea does not use CPAP, diabetes mellitus type II, restless leg syndrome, bladder cancer, GERD, Barrett's esophagus, choledocholithiasis with a distal  biliary stricture s/p multiple ERCPs with stone extraction and 3 biliary stent placement with subsequent bilary stent removal 02/2020, prior pancreatitis secondary to gallstone disease and history of elevated Ig4 level. S/P cholecystectomy 12/2018 and others listed below presents for follow up.  12/29/2017 colonoscopy 3 mm polyp descending colon, diverticulosis, TA polyp, no dysplasia 09/12/2018 EGD which showed Barrett's esophagus without dysplasia, gastritis and a hyperplastic gastric polyp.  No evidence of H. pylori.  The esophagus was dilated.  04/10/2022 EGD and flex sigmoidoscopy  Flex sig had poor bowel prep, - Preparation of the colon was poor. - Hemorrhoids found on digital rectal exam. - Two 6 to 8 mm polyps in the transverse colon, removed with a cold snare. Resected and retrieved. - Stool in the descending colon, at the splenic flexure and in the transverse colon. - Normal mucosa in the rectum, in the recto- sigmoid colon, in the sigmoid colon and in the descending colon. Biopsied. - Non- bleeding non- thrombosed external and internal hemorrhoids. Recommended 1 year follow up    Discussed the use of AI scribe software for clinical note transcription with the patient, who gave verbal consent to proceed.  History of Present Illness           Wt Readings from Last 5 Encounters:  03/16/23 165 lb (74.8 kg)  02/11/23 167 lb 3.2 oz (75.8 kg)  12/28/22 164 lb (74.4 kg)  12/01/22 160 lb 9.6 oz (72.8 kg)  11/12/22 164 lb 9.6 oz (74.7 kg)     He  reports that he quit smoking about 47 years ago. His smoking use  included cigarettes. He started smoking about 52 years ago. He has a 2.5 pack-year smoking history. He has never used smokeless tobacco. He reports that he does not drink alcohol and does not use  drugs.   MOST RECENT GI PROCEDURES: 01/01/2023 CT AB and pelvis WITHOUT due to kidney function IMPRESSION: 1. No acute intra-abdominal or pelvic pathology. 2. Sigmoid diverticulosis. No bowel obstruction. Normal appendix. 3. Status post prior right nephrectomy. 4.  Aortic Atherosclerosis (ICD10-I70.0).  EGD 04/10/2022  - Salmon- colored mucosal tongue suspicious for short- segment Barrett' s esophagus found distally. Biopsied. - Z- line irregular, 40 cm from the incisors. - 1 cm hiatal hernia. - Dilation performed in the esophagus with savory 16 mm and 18 mm with mucosal wrent noted at the GE junction. - Erythematous mucosa in the antrum. No other gross lesions in the entire stomach. Biopsied. - Duodenal angulation deformity in the duodenal sweep. No other gross mucosal lesions in the duodenal bulb, in the first portion of the duodenum and in the second portion of the duodenum. Biopsied. 1. Surgical [P], gastric antrum and gastric body - ANTRAL AND OXYNTIC MUCOSA WITH NO SIGNIFICANT PATHOLOGY. - NO HELICOBACTER PYLORI ORGANISMS IDENTIFIED ON H&E STAINED SLIDE. 2. Surgical [P], duodenal bulb, 2nd portion of duodenum, and distal duodenum - DUODENAL MUCOSA WITH PROMINENT BRUNNER'S GLANDS, OTHERWISE NO SIGNIFICANT PATHOLOGY. 3. Surgical [P], distal esophagus - SQUAMOCOLUMNAR JUNCTIONAL MUCOSA WITH MILD CHANGES CONSISTENT WITH REFLUX. - NEGATIVE FOR INTESTINAL METAPLASIA. 4. Surgical [P], random esophagus biopsies - SQUAMOUS MUCOSA WITH INCREASED INTRAEPITHELIAL LYMPHOCYTES Flexible sigmoidoscopy 04/10/2022 - Preparation of the colon was poor. - Hemorrhoids found on digital rectal exam. - Two 6 to 8 mm polyps in the transverse colon, removed with a cold snare. Resected and retrieved. - Stool in the descending colon, at the splenic flexure and in the transverse colon. - Normal mucosa in the rectum, in the recto- sigmoid colon, in the sigmoid colon and in the descending colon. Biopsied. - Non-  bleeding non- thrombosed external and internal hemorrhoids.  TA polyp and hyperplastic polyp. PATH: 1. Surgical [P], gastric antrum and gastric body - ANTRAL AND OXYNTIC MUCOSA WITH NO SIGNIFICANT PATHOLOGY. - NO HELICOBACTER PYLORI ORGANISMS IDENTIFIED ON H&E STAINED SLIDE. 2. Surgical [P], duodenal bulb, 2nd portion of duodenum, and distal duodenum - DUODENAL MUCOSA WITH PROMINENT BRUNNER'S GLANDS, OTHERWISE NO SIGNIFICANT PATHOLOGY. 3. Surgical [P], distal esophagus - SQUAMOCOLUMNAR JUNCTIONAL MUCOSA WITH MILD CHANGES CONSISTENT WITH REFLUX. - NEGATIVE FOR INTESTINAL METAPLASIA. 4. Surgical [P], random esophagus biopsies - SQUAMOUS MUCOSA WITH INCREASED INTRAEPITHELIAL LYMPHOCYTES. NOTE: THE ABOVE FINDINGS ARE NONSPECIFIC AND HAVE BEEN ASSOCIATED WITH CANDIDIASIS (A PAS STAIN IS PENDING AND WILL BE REPORTED IN AN ADDENDUM), DYSMOTILITY DISORDERS/ACHALASIA, AS WELL AS CROHN'S DISEASE (TYPICALLY IN THE YOUNGER NATURE) AMONGST OTHERS. 5. Surgical [P], colon, transverse, sigmoid, polyp (3) - TUBULAR ADENOMA, FRAGMENTS. - HYPERPLASTIC POLYP. 6. Surgical [P], colon nos, random sites - COLONIC MUCOSA WITHIN NORMAL LIMITS. 7. Surgical [P], colon, rectum - COLONIC MUCOSA WITHIN NORMAL LIMITS.  ERCP 06/03/2020: - Prior biliary sphincterotomy appeared open. - The entire main bile duct was moderately dilated. - A filling defect was noted that was felt to be more likely air rather than stone on fluoroscopy. - The biliary tree was swept and very small amount of sludge was found. Stones were not removed. - The filling defect looked to have been removed completely, but the size of the previous stone on CT scan made me feel need to ensure it was gone. SpyGlass interrogation was  performed. There was mild narrowing in the distal CBD, but the majority of the rest of the biliary tract had normal mucosa. - Incidental mucosal tear noted after Duodenoscope/SpyScope positioning loss in the D1/D2 angle.  Five hemostatic clips were successfully placed (MR conditional) to close this defect.   ERCP 03/11/2020: - No gross lesions in esophagus proximally. Salmon-colored mucosal islands suspicious for short-segment Barrett's esophagus - biopsied. - Erythematous mucosa in the antrum. No other gross lesions in the stomach. Biopsied. - No gross lesions in the duodenal bulb, in the first portion of the duodenum and in the second portion of the duodenum. - Prior biliary sphincterotomy appeared open. Three stents from the biliary tree were seen in the major papilla - these were removed and sent for cytology. - The fluoroscopic examination was suspicious for sludge as well as biliary dilation in the main duct. - A fine tapering was noted distally. Not clearly a stricture but could be narrowing. This  was dilated and brushed for cytology. - Choledocholithiasis was found. Complete removal was accomplished by sweeping. - At end of procedure, the biliary tree was swept and nothing was found. - Pull through of the 12 mm balloon through the entire duct was felt to be adequate so I did not replace the previous plastic stents for a trial of how the patient does without stenting in regards to LFT pattern and overall clinical scenario.   ERCP 11/28/2018: second portion of the duodenum. - Prior biliary sphincterotomy appeared open. - Two visibly patent stents from the biliary tree were seen in the major papilla. These were removed and sent for cytology. - The fluoroscopic examination was suspicious for sludge/debris. - A single localized biliary stricture was found in the lower third of the main bile duct. The stricture was indeterminate. This was dilated. This was brushed for cytology. - Choledocholithiasis and biliary sludge was found. Complete removal was accomplished by sweeping on multiple occasions. Occlusion cholangiogram showed a clear duct at end of procedure. - Moderately narrowed lumen in the biliary  tract visualized via SpyGlass. This was biopsied. - Three plastic biliary stents were placed into the common bile duct to aid in dilation of the narrowing. - Overall, suspect this is more an inflammatory stricture/stenosis from stone disease. The consideration of an IgG4 mediated process remains. However, there is no doubt that the patient continues to drop stones/sludge from the gallbladder into the duct as evidenced on last 2 ERCPs of significant stone/debris being removed   EGD/EUS/ERCP 09/12/2018: - Prior biliary sphincterotomy appeared open. - Filling defects consistent with stones and sludge were seen on the cholangiogram. - A single moderate biliary stricture was found in the lower third of the main bile duct. The stricture was indeterminate. This stricture was dilated. This stricture was brushed for cytology. Two plastic biliary stents were placed into the common bile duct to traverse the stricture. - The entire main bile duct was severely dilated, secondary to aforementioned stricture. - Choledocholithiasis and clot/debris was found. Complete removal was accomplished by sweeping after sphincteroplasty. EGD Impression: - No gross lesions in proximal/middle esophagus. Salmon-colored mucosa suspicious for short-segment Barrett's esophagus. Biopsied. Dilation of esophagus performed. - Gastritis. Biopsied. - A single gastric polyp. Resected and retrieved via mucosal resection. Clips (MR conditional) were placed to close the defect. - No gross lesions in the duodenal bulb, in the first portion of the duodenum and in the second portion of the duodenum. Biopsied for enteropathy/celiac rule out. - Plastic biliary stent in the duodenum. Removed. -  Patent sphincterotomy was found. EUS Impression: - There was a suggestion of a stricture in the lower third of the main bile duct. Fine needle biopsy performed of the region (although no overt mass was noted significant thickening present). -  Multiple stones were visualized endosonographically in the common bile duct. - There was dilation in the common bile duct and in the common hepatic duct which measured up to 17 mm. - The pancreatic duct had a normal endosonographic appearance in the pancreatic head, genu of the pancreas, body of the pancreas and tail of the pancreas. - Pancreatic parenchymal abnormalities consisting of diffuse echogenicity and lobularity were noted in the entire pancreas. - No malignant-appearing lymph nodes were visualized in the celiac region (level 20), peripancreatic region and porta hepatis region. 1. Duodenum, Biopsy - BENIGN SMALL BOWEL MUCOSA. - NO ACTIVE INFLAMMATION OR VILLOUS ATROPHY IDENTIFIED. 2. Stomach, biopsy - CHRONIC INACTIVE GASTRITIS. - THERE IS NO EVIDENCE OF HELICOBACTER PYLORI, DYSPLASIA OR MALIGNANCY. - SEE COMMENT. 3. Esophagus, biopsy - INTESTINAL METAPLASIA (GOBLET CELL METAPLASIA) CONSISTENT WITH BARRETT'S ESOPHAGUS. - THERE IS NO EVIDENCE OF DYSPLASIA OR MALIGNANCY. 4. Esophagus, biopsy, random - BENIGN SQUAMOUS MUCOSA. - THERE IS NO EVIDENCE OF SIGNIFICANT INFLAMMATION, DYSPLASIA, OR MALIGNANCY. 5. Stomach, biopsy, pre-pyloric - ULCERATED HYPERPLASTIC POLYP(S). - THERE IS NO EVIDENCE OF MALIGNANCY   EUS/ERCP 04/01/2018:   EGD Impression: - White nummular lesions on esophageal mucosa. Biopsied for Candida rule out. - Gastric mucosal atrophy at fundus. Biopsied. - Non-bleeding erosive gastropathy. Biopsied for HP. - Congested, erythematous, friable (with contact bleeding), granular, inflamed and texture changed mucosa in the pylorus. Biopsied to rule out adenoma or other processes.. - No gross lesions in the duodenal bulb, in the first portion of the duodenum and in the second portion of the duodenum. - Ampulla was flat. No bile was able to be suctioned out. EUS Impression: - Pancreatic parenchymal abnormalities consisting of diffuse echogenicity were noted in the genu  of the pancreas, pancreatic body and pancreatic tail. The head of the pancreas due to the heterogeneity was not grossly abnormal however, the chance of missing subtle lesions with decreased sensitivity in setting of acute pancreatitis is possible. - The pancreatic duct had a normal endosonographic appearance in the genu of the pancreas, body of the pancreas and tail of the pancreas. - There was dilation in the common bile duct and in the common hepatic duct. - There was a suggestion of a stricture in the lower third of the main bile duct. - Hyperechoic material consistent with sludge was visualized endosonographically in the common bile duct above the likely stricture. - Hyperechoic material consistent with sludge was visualized endosonographically in the gallbladder - The major papilla appeared to be flat. - The fluoroscopic examination was suspicious for sludge. - A single moderate biliary stricture was found in the lower third of the main bile duct. The stricture was indeterminate. This was brushed for cytology. - A biliary sphincterotomy was performed. - The biliary tree was swept and sludge and debris were found. - One plastic biliary stent was placed into the common bile duct and allowed good drainage at the end of the procedure of the biliary tree suggestive that stenosis was significant.   Colonoscopy 12/29/2017: - One 3 mm polyp in the descending colon, removed with a cold snare. Resected and retrieved. - Diverticulosis in the sigmoid colon and in the ascending colon. - The examination was otherwise normal. No cause for pain noted on this exam. 1. Surgical [P], gastric  cardia nodule - HYPERPLASTIC POLYP(S). - THERE IS NO EVIDENCE OF MALIGNANCY. 2. Surgical [P], GE junction - GASTROESOPHAGEAL JUNCTION MUCOSA WITH MILD INFLAMMATION CONSISTENT WITH GASTROESOPHAGEAL REFLUX. - THERE IS NO EVIDENCE OF GOBLET CELL METAPLASIA, DYSPLASIA, OR MALIGNANCY. 3. Surgical [P], descending  colon, polyp - TUBULAR ADENOMA(S). - HIGH GRADE DYSPLASIA IS NOT IDENTIFIED  RELEVANT LABS AND IMAGING:  CBC    Component Value Date/Time   WBC 7.5 12/28/2022 1214   RBC 4.25 12/28/2022 1214   HGB 12.3 (L) 12/28/2022 1214   HGB 13.7 12/09/2020 0853   HGB 13.0 04/25/2007 1127   HCT 36.9 (L) 12/28/2022 1214   HCT 40.5 12/09/2020 0853   HCT 37.0 (L) 04/25/2007 1127   PLT 190.0 12/28/2022 1214   PLT 161 12/09/2020 0853   MCV 86.8 12/28/2022 1214   MCV 87 12/09/2020 0853   MCV 89.0 04/25/2007 1127   MCH 28.3 05/22/2022 0934   MCHC 33.2 12/28/2022 1214   RDW 14.2 12/28/2022 1214   RDW 13.0 12/09/2020 0853   RDW 13.8 04/25/2007 1127   LYMPHSABS 2.3 12/28/2022 1214   LYMPHSABS 2.5 04/25/2007 1127   MONOABS 0.7 12/28/2022 1214   MONOABS 0.6 04/25/2007 1127   EOSABS 0.4 12/28/2022 1214   EOSABS 0.0 04/25/2007 1127   BASOSABS 0.1 12/28/2022 1214   BASOSABS 0.0 04/25/2007 1127   Recent Labs    04/29/22 1330 05/22/22 0934 06/02/22 1000 06/03/22 1539 06/04/22 0503 12/28/22 1214  HGB 12.5* 12.6* 13.1 12.2* 10.3* 12.3*    CMP     Component Value Date/Time   NA 143 12/28/2022 1214   NA 144 12/09/2020 0853   K 5.3 No hemolysis seen (H) 12/28/2022 1214   CL 107 12/28/2022 1214   CO2 27 12/28/2022 1214   GLUCOSE 99 12/28/2022 1214   BUN 35 (H) 12/28/2022 1214   BUN 21 12/09/2020 0853   CREATININE 2.60 (H) 12/28/2022 1214   CREATININE 1.61 (H) 11/09/2019 0939   CALCIUM 8.8 12/28/2022 1214   PROT 7.0 12/28/2022 1214   PROT 7.2 12/09/2020 0853   ALBUMIN 4.0 12/28/2022 1214   ALBUMIN 4.2 12/09/2020 0853   AST 16 12/28/2022 1214   ALT 7 12/28/2022 1214   ALKPHOS 66 12/28/2022 1214   BILITOT 0.3 12/28/2022 1214   BILITOT 0.4 12/09/2020 0853   GFRNONAA 30 (L) 06/04/2022 0503   GFRAA 47 (L) 11/13/2019 1006      Latest Ref Rng & Units 12/28/2022   12:14 PM 06/02/2022   10:00 AM 05/22/2022    9:34 AM  Hepatic Function  Total Protein 6.0 - 8.3 g/dL 7.0  6.8  7.3   Albumin  3.5 - 5.2 g/dL 4.0  3.9  3.8   AST 0 - 37 U/L 16  16  18    ALT 0 - 53 U/L 7  7  11    Alk Phosphatase 39 - 117 U/L 66  58  56   Total Bilirubin 0.2 - 1.2 mg/dL 0.3  0.5  0.6       Current Medications:    Current Outpatient Medications (Cardiovascular):    amLODipine (NORVASC) 5 MG tablet, TAKE 1 TABLET BY MOUTH DAILY   ezetimibe (ZETIA) 10 MG tablet, TAKE 1 TABLET BY MOUTH DAILY   isosorbide mononitrate (IMDUR) 60 MG 24 hr tablet, TAKE 1 TABLET BY MOUTH DAILY   nebivolol (BYSTOLIC) 10 MG tablet, TAKE 1 TABLET BY MOUTH DAILY   nitroGLYCERIN (NITROSTAT) 0.4 MG SL tablet, DISSOLVE ONE TABLET UNDER THE TONGUE EVERY 5 MINUTES  AS NEEDED FOR CHEST PAIN. (Patient not taking: Reported on 03/16/2023)   rosuvastatin (CRESTOR) 10 MG tablet, TAKE 1 TABLET BY MOUTH DAILY  Current Outpatient Medications (Respiratory):    cetirizine (ZYRTEC) 10 MG tablet, Take 10 mg by mouth daily as needed for allergies.   fluticasone (FLONASE) 50 MCG/ACT nasal spray, Place 1 spray into both nostrils daily as needed for allergies.  Current Outpatient Medications (Analgesics):    acetaminophen (TYLENOL) 500 MG tablet, Take 1,000 mg by mouth every 6 (six) hours as needed for moderate pain.   aspirin EC 81 MG tablet, Take 81 mg by mouth every evening. Swallow whole.   oxyCODONE-acetaminophen (PERCOCET) 10-325 MG tablet, Take 1 tablet by mouth 2 (two) times daily.   oxyCODONE-acetaminophen (PERCOCET) 10-325 MG tablet, Take 1 tablet by mouth 2 (two) times daily.   oxyCODONE-acetaminophen (PERCOCET) 10-325 MG tablet, Take 1 tablet by mouth 2 (two) times daily.  Current Outpatient Medications (Hematological):    clopidogrel (PLAVIX) 75 MG tablet, TAKE 1 TABLET BY MOUTH ONCE  DAILY   cyanocobalamin (VITAMIN B12) 1000 MCG/ML injection, INJECT 1 ML SUBCUTANEOUSLY ONCE EVERY MONTH   Fe Bisgly-Vit C-Vit B12-FA (GENTLE IRON PO), Take by mouth. daily  Current Outpatient Medications (Other):    Coenzyme Q10 200 MG capsule, Take  200 mg by mouth daily.   doxycycline (VIBRA-TABS) 100 MG tablet, Take 1 tablet (100 mg total) by mouth 2 (two) times daily.   Menthol, Topical Analgesic, (BENGAY EX), Apply 1 application topically daily as needed (pain).   METAMUCIL FIBER PO, Take 1 capsule by mouth in the morning.   Multiple Vitamin (MULTIVITAMIN) tablet, Take 1 tablet by mouth daily.   Naphazoline-Pheniramine (OPCON-A) 0.027-0.315 % SOLN, Place 1 drop into both eyes daily as needed (itching eyes).   NEEDLE, DISP, 25 G (B-D DISP NEEDLE 25GX1") 25G X 1" MISC, Inject 1000 mcg into muscle once a month.   NON FORMULARY, Diltiazem 2%/Lidocaine5% compound Use 3 x rectally daily for 2 months to heal anal fissure   pantoprazole (PROTONIX) 40 MG tablet, TAKE 1 TABLET BY MOUTH ONCE  DAILY   polyethylene glycol powder (GLYCOLAX/MIRALAX) 17 GM/SCOOP powder, Take 1 Container by mouth daily. As needed   prednisoLONE acetate (PRED FORTE) 1 % ophthalmic suspension, Place 1 drop into both eyes in the morning, at noon, and at bedtime.   rOPINIRole (REQUIP) 2 MG tablet, TAKE 1 TABLET BY MOUTH AT  BEDTIME   saccharomyces boulardii (FLORASTOR) 250 MG capsule, Take 250 mg by mouth 2 (two) times daily.   venlafaxine (EFFEXOR) 75 MG tablet, TAKE 1 TABLET BY MOUTH DAILY  Medical History:  Past Medical History:  Diagnosis Date   Allergy    Anxiety    B12 deficiency anemia    Blood transfusion without reported diagnosis    CAD (coronary artery disease)    Cancer (HCC)    bladder, Right renal cell also   Chronic kidney disease    Colon polyps    COPD (chronic obstructive pulmonary disease) (HCC)    Depression    Diabetes mellitus (HCC)    Dyspnea    Esophagus, Barrett's    GERD (gastroesophageal reflux disease)    Headache    History of bladder cancer    Bladder cancer "8 times"   History of hiatal hernia    Hyperlipidemia    Hypertension    Localized osteoarthrosis, lower leg    Myocardial infarction (HCC) 2021   Pneumonia     Restless leg syndrome  Sleep apnea    does not wear cpap   Stenosis of esophagus    Allergies: No Known Allergies   Surgical History:  He  has a past surgical history that includes Coronary artery bypass graft; Cervical discectomy; Lumbar laminectomy; bladder cancer; Popliteal synovial cyst excision; Knee arthroscopy (Left); Esophagogastroduodenoscopy (04/29/2010); Colonoscopy (11/17/2005); I & D extremity (Left, 11/24/2016); Nasal sinus surgery; Endoscopic retrograde cholangiopancreatography (ercp) with propofol (N/A, 04/01/2018); biopsy (04/01/2018); sphincterotomy (04/01/2018); Biliary brushing (04/01/2018); biliary stent placement (04/01/2018); Esophagogastroduodenoscopy (egd) with propofol (N/A, 04/01/2018); EUS (04/01/2018); removal of stones (04/01/2018); Esophagogastroduodenoscopy (egd) with propofol (N/A, 09/12/2018); EUS (N/A, 09/12/2018); Endoscopic retrograde cholangiopancreatography (ercp) with propofol (N/A, 09/12/2018); Stent removal (09/12/2018); biopsy (09/12/2018); Fine needle aspiration (09/12/2018); Biliary dilation (09/12/2018); biliary stent placement (09/12/2018); Biliary brushing (09/12/2018); removal of stones (09/12/2018); Endoscopic mucosal resection (09/12/2018); Submucosal lifting injection (09/12/2018); Hemostasis clip placement (09/12/2018); Savory dilation (N/A, 09/12/2018); ERCP (N/A, 11/28/2018); Spyglass cholangioscopy (N/A, 11/28/2018); Savory dilation (N/A, 11/28/2018); Esophagogastroduodenoscopy (egd) with propofol (N/A, 11/28/2018); Stent removal (11/28/2018); Biliary dilation (11/28/2018); biopsy (11/28/2018); Biliary brushing (11/28/2018); removal of stones (11/28/2018); biliary stent placement (11/28/2018); LEFT HEART CATH AND CORS/GRAFTS ANGIOGRAPHY (N/A, 01/05/2019); CORONARY STENT INTERVENTION (N/A, 01/05/2019); Endoscopic retrograde cholangiopancreatography (ercp) with propofol (N/A, 03/11/2020); Esophagogastroduodenoscopy (egd) with propofol (N/A, 03/11/2020);  biopsy (03/11/2020); Stent removal (03/11/2020); removal of stones (03/11/2020); Biliary dilation (03/11/2020); Biliary brushing (03/11/2020); Hand surgery (Left, 2018); Endoscopic retrograde cholangiopancreatography (ercp) with propofol (N/A, 06/03/2020); Hemostasis clip placement (06/03/2020); Spyglass cholangioscopy (N/A, 06/03/2020); removal of stones (06/03/2020); Esophagogastroduodenoscopy (egd) with propofol (N/A, 06/03/2020); Septoplasty (Bilateral, 05/26/2021); Cholecystectomy; Cystoscopy with retrograde pyelogram, ureteroscopy and stent placement (Right, 03/27/2022); Cystoscopy with biopsy (N/A, 03/27/2022); and Robot assited laparoscopic nephroureterectomy (Right, 06/03/2022). Family History:  His family history includes Coronary artery disease in an other family member; Hypertension in his father; Melanoma in his mother; Stroke in his father.  REVIEW OF SYSTEMS  : All other systems reviewed and negative except where noted in the History of Present Illness.  PHYSICAL EXAM: There were no vitals taken for this visit. General Appearance: Well nourished, in no apparent distress. Head:   Normocephalic and atraumatic. Eyes:  sclerae anicteric,conjunctive pink  Respiratory: Respiratory effort normal, BS equal bilaterally with wheezing, without rales, rhonchi, Cardio: RRR with no MRGs. Peripheral pulses intact.  Abdomen: Soft,  Obese ,active bowel sounds.  Mild epigastric tenderness. Without guarding and Without rebound. No masses. Rectal: Not evaluated Musculoskeletal: Full ROM, Normal gait. Without edema. Skin:  Dry and intact without significant lesions or rashes Neuro: Alert and  oriented x4;  No focal deficits. Psych:  Cooperative. Normal mood and affect.    Doree Albee, PA-C 7:32 AM

## 2023-05-09 ENCOUNTER — Other Ambulatory Visit: Payer: Self-pay | Admitting: Cardiovascular Disease

## 2023-05-09 DIAGNOSIS — R0609 Other forms of dyspnea: Secondary | ICD-10-CM

## 2023-05-10 ENCOUNTER — Telehealth: Payer: Self-pay

## 2023-05-10 NOTE — Telephone Encounter (Signed)
 Copied from CRM [id number]. Topic: General - Other >> May 10, 2023 10:20 AM Emylou G wrote: Reason for CRM: Patient called.. said isn't familiar w/my chart.. can you call him versus video for tomorrows appt.. number on file is good

## 2023-05-10 NOTE — Telephone Encounter (Signed)
 Left message on machine for patient letting him know that tomorrow's visit will be by telephone.

## 2023-05-11 ENCOUNTER — Ambulatory Visit (INDEPENDENT_AMBULATORY_CARE_PROVIDER_SITE_OTHER): Payer: Medicare Other | Admitting: Family Medicine

## 2023-05-11 DIAGNOSIS — Z0000 Encounter for general adult medical examination without abnormal findings: Secondary | ICD-10-CM | POA: Diagnosis not present

## 2023-05-11 NOTE — Progress Notes (Signed)
 PATIENT CHECK-IN and HEALTH RISK ASSESSMENT QUESTIONNAIRE:  -completed by phone/video for upcoming Medicare Preventive Visit  Pre-Visit Check-in: 1)Vitals (height, wt, BP, etc) - record in vitals section for visit on day of visit Request home vitals (wt, BP, etc.) and enter into vitals, THEN update Vital Signs SmartPhrase below at the top of the HPI. See below.  2)Review and Update Medications, Allergies PMH, Surgeries, Social history in Epic 3)Hospitalizations in the last year with date/reason? Nephrectomy about 1 year ago 4)Review and Update Care Team (patient's specialists) in Epic 5) Complete PHQ9 in Epic  6) Complete Fall Screening in Epic 7)Review all Health Maintenance Due and order under PCP if not done.  Medicare Wellness Patient Questionnaire:  Answer theses question about your habits: How often do you have a drink containing alcohol?n How many drinks containing alcohol do you have on a typical day when you are drinking?na How often do you have six or more drinks on one occasion?n Have you ever smoked?n Do you use an illicit drugs?n On average, how many days per week do you engage in moderate to strenuous exercise (like a brisk walk)? Active in the yards some, does some exercises in the morning and in the evening Typical breakfast: waffles Typical lunch:veggies and protein Typical dinner: veggies and proteins and beans, collards  Beverages: coffee, a little water , milk  Answer theses question about your everyday activities: Can you perform most household chores?y Are you deaf or have significant trouble hearing?n, mild Do you feel that you have a problem with memory?n Do you feel safe at home?y Last dentist visit? dentures 8. Do you have any difficulty performing your everyday activities?n Are you having any difficulty walking, taking medications on your own, and or difficulty managing daily home needs?n Do you have difficulty walking or climbing stairs?n Do you have  difficulty dressing or bathing?n Do you have difficulty doing errands alone such as visiting a doctor's office or shopping?n Do you currently have any difficulty preparing food and eating?n Do you currently have any difficulty using the toilet?n Do you have any difficulty managing your finances?n Do you have any difficulties with housekeeping of managing your housekeeping?n   Do you have Advanced Directives in place (Living Will, Healthcare Power or Attorney)? [email or web address]   Last eye Exam and location? Had eye exam a few months ago with Dr. Francetta Innocent   Do you currently use prescribed or non-prescribed narcotic or opioid pain medications? Tries to take as little as possible, takes 5mg  oxycodone  a few times a day, reports limits  Do you have a history or close family history of breast, ovarian, tubal or peritoneal cancer or a family member with BRCA (breast cancer susceptibility 1 and 2) gene mutations?    ----------------------------------------------------------------------------------------------------------------------------------------------------------------------------------------------------------------------  Because this visit was a virtual/telehealth visit, some criteria may be missing or patient reported. Any vitals not documented were not able to be obtained and vitals that have been documented are patient reported.    MEDICARE ANNUAL PREVENTIVE CARE VISIT WITH PROVIDER (Welcome to Medicare, initial annual wellness or annual wellness exam)  Virtual Visit via Phone Note  I connected with [name] on 05/11/23  by phone and verified that I am speaking with the correct person using two identifiers. Declines video visit and prefers to do phone visit.   Location patient: home Location provider:work or home office Persons participating in the virtual visit: patient, provider  Concerns and/or follow up today: doing ok per his report   See HM section in  Epic for other details of  completed HM.    ROS: negative for report of fevers, unintentional weight loss, vision changes, vision loss, hearing loss or change, chest pain, sob stable, hemoptysis, melena, hematochezia, hematuria, falls, bleeding or bruising, thoughts of suicide or self harm, memory loss  Patient-completed extensive health risk assessment - reviewed and discussed with the patient: See Health Risk Assessment completed with patient prior to the visit either above or in recent phone note. This was reviewed in detailed with the patient today and appropriate recommendations, orders and referrals were placed as needed per Summary below and patient instructions.   Review of Medical History: -PMH, PSH, Family History and current specialty and care providers reviewed and updated and listed below   Patient Care Team: Zilphia Hilt, Charyl Coppersmith, MD as PCP - General (Internal Medicine) Millicent Ally, MD as PCP - Cardiology (Cardiology) Mansouraty, Albino Alu., MD as Consulting Physician (Gastroenterology)   Past Medical History:  Diagnosis Date   Allergy     Anxiety    B12 deficiency anemia    Blood transfusion without reported diagnosis    CAD (coronary artery disease)    Cancer (HCC)    bladder, Right renal cell also   Chronic kidney disease    Colon polyps    COPD (chronic obstructive pulmonary disease) (HCC)    Depression    Diabetes mellitus (HCC)    Dyspnea    Esophagus, Barrett's    GERD (gastroesophageal reflux disease)    Headache    History of bladder cancer    Bladder cancer "8 times"   History of hiatal hernia    Hyperlipidemia    Hypertension    Localized osteoarthrosis, lower leg    Myocardial infarction (HCC) 2021   Pneumonia    Restless leg syndrome    Sleep apnea    does not wear cpap   Stenosis of esophagus     Past Surgical History:  Procedure Laterality Date   BILIARY BRUSHING  04/01/2018   Procedure: BILIARY BRUSHING;  Surgeon: Normie Becton., MD;  Location:  [facility] ENDOSCOPY;  Service: Gastroenterology;;   BILIARY BRUSHING  09/12/2018   Procedure: BILIARY BRUSHING;  Surgeon: Normie Becton., MD;  Location: [facility] ENDOSCOPY;  Service: Gastroenterology;;   BILIARY BRUSHING  11/28/2018   Procedure: BILIARY BRUSHING;  Surgeon: Normie Becton., MD;  Location: [facility] ENDOSCOPY;  Service: Gastroenterology;;   BILIARY BRUSHING  03/11/2020   Procedure: BILIARY BRUSHING;  Surgeon: Normie Becton., MD;  Location: [facility] ENDOSCOPY;  Service: Gastroenterology;;   BILIARY DILATION  09/12/2018   Procedure: BILIARY DILATION;  Surgeon: Normie Becton., MD;  Location: [facility] ENDOSCOPY;  Service: Gastroenterology;;   BILIARY DILATION  11/28/2018   Procedure: BILIARY DILATION;  Surgeon: Normie Becton., MD;  Location: [facility] ENDOSCOPY;  Service: Gastroenterology;;   BILIARY DILATION  03/11/2020   Procedure: BILIARY DILATION;  Surgeon: Normie Becton., MD;  Location: [facility] ENDOSCOPY;  Service: Gastroenterology;;   BILIARY STENT PLACEMENT  04/01/2018   Procedure: BILIARY STENT PLACEMENT;  Surgeon: Normie Becton., MD;  Location: [facility] ENDOSCOPY;  Service: Gastroenterology;;   BILIARY STENT PLACEMENT  09/12/2018   Procedure: BILIARY STENT PLACEMENT;  Surgeon: Normie Becton., MD;  Location: [facility] ENDOSCOPY;  Service: Gastroenterology;;   BILIARY STENT PLACEMENT  11/28/2018   Procedure: BILIARY STENT PLACEMENT;  Surgeon: Normie Becton., MD;  Location: [facility] ENDOSCOPY;  Service: Gastroenterology;;   BIOPSY  04/01/2018   Procedure: BIOPSY;  Surgeon: Normie Becton., MD;  Location:  [facility] ENDOSCOPY;  Service: Gastroenterology;;   BIOPSY  09/12/2018   Procedure: BIOPSY;  Surgeon: Normie Becton., MD;  Location[facility] ENDOSCOPY;  Service: Gastroenterology;;   BIOPSY  11/28/2018   Procedure: BIOPSY;  Surgeon: Normie Becton., MD;  Location: [facility] ENDOSCOPY;  Service: Gastroenterology;;   BIOPSY  03/11/2020   Procedure:  BIOPSY;  Surgeon: Normie Becton., MD;  Location: [facility] ENDOSCOPY;  Service: Gastroenterology;;   bladder cancer      x 8 cystoscopy   CERVICAL DISCECTOMY     ACDF   CHOLECYSTECTOMY     COLONOSCOPY  11/17/2005   normal    CORONARY ARTERY BYPASS GRAFT     x4   CORONARY STENT INTERVENTION N/A 01/05/2019   Procedure: CORONARY STENT INTERVENTION;  Surgeon: Sammy Crisp, MD;  Location: [facility] INVASIVE CV LAB;  Service: Cardiovascular;  Laterality: N/A;   CYSTOSCOPY WITH BIOPSY N/A 03/27/2022   Procedure: CYSTOSCOPY WITH BIOPSY;  Surgeon: Homero Luster, MD;  Location: [facility] ORS;  Service: Urology;  Laterality: N/A;   CYSTOSCOPY WITH RETROGRADE PYELOGRAM, URETEROSCOPY AND STENT PLACEMENT Right 03/27/2022   Procedure: CYSTOSCOPY WITH RIGHT RETROGRADE PYELOGRAM, URETEROSCOPY AND STENT PLACEMENT urethral dilation;  Surgeon: Homero Luster, MD;  Location: [facility] ORS;  Service: Urology;  Laterality: Right;   ENDOSCOPIC MUCOSAL RESECTION  09/12/2018   Procedure: ENDOSCOPIC MUCOSAL RESECTION;  Surgeon: Brice Campi Albino Alu., MD;  Location: [facility] ENDOSCOPY;  Service: Gastroenterology;;   ENDOSCOPIC RETROGRADE CHOLANGIOPANCREATOGRAPHY (ERCP) WITH PROPOFOL  N/A 04/01/2018   Procedure: ENDOSCOPIC RETROGRADE CHOLANGIOPANCREATOGRAPHY (ERCP) WITH PROPOFOL ;  Surgeon: Normie Becton., MD;  Location: [facility] ENDOSCOPY;  Service: Gastroenterology;  Laterality: N/A;   ENDOSCOPIC RETROGRADE CHOLANGIOPANCREATOGRAPHY (ERCP) WITH PROPOFOL  N/A 09/12/2018   Procedure: ENDOSCOPIC RETROGRADE CHOLANGIOPANCREATOGRAPHY (ERCP) WITH PROPOFOL ;  Surgeon: Brice Campi Albino Alu., MD;  Location: [facility] ENDOSCOPY;  Service: Gastroenterology;  Laterality: N/A;   ENDOSCOPIC RETROGRADE CHOLANGIOPANCREATOGRAPHY (ERCP) WITH PROPOFOL  N/A 03/11/2020   Procedure: ENDOSCOPIC RETROGRADE CHOLANGIOPANCREATOGRAPHY (ERCP) WITH PROPOFOL ;  Surgeon: Brice Campi Albino Alu., MD;  Location: [facility] ENDOSCOPY;  Service: Gastroenterology;  Laterality: N/A;   ENDOSCOPIC  RETROGRADE CHOLANGIOPANCREATOGRAPHY (ERCP) WITH PROPOFOL  N/A 06/03/2020   Procedure: ENDOSCOPIC RETROGRADE CHOLANGIOPANCREATOGRAPHY (ERCP) WITH PROPOFOL ;  Surgeon: Brice Campi Albino Alu., MD;  Location: [facility] ENDOSCOPY;  Service: Gastroenterology;  Laterality: N/A;   ERCP N/A 11/28/2018   Procedure: ENDOSCOPIC RETROGRADE CHOLANGIOPANCREATOGRAPHY (ERCP) +EGD with spyglass;  Surgeon: Brice Campi Albino Alu., MD;  Location: [facility] ENDOSCOPY;  Service: Gastroenterology;  Laterality: N/A;   ESOPHAGOGASTRODUODENOSCOPY  04/29/2010   ESOPHAGOGASTRODUODENOSCOPY (EGD) WITH PROPOFOL  N/A 04/01/2018   Procedure: ESOPHAGOGASTRODUODENOSCOPY (EGD) WITH PROPOFOL ;  Surgeon: Brice Campi Albino Alu., MD;  Location: [facility] ENDOSCOPY;  Service: Gastroenterology;  Laterality: N/A;   ESOPHAGOGASTRODUODENOSCOPY (EGD) WITH PROPOFOL  N/A 09/12/2018   Procedure: ESOPHAGOGASTRODUODENOSCOPY (EGD) WITH PROPOFOL ;  Surgeon: Brice Campi Albino Alu., MD;  Location: [facility] ENDOSCOPY;  Service: Gastroenterology;  Laterality: N/A;   ESOPHAGOGASTRODUODENOSCOPY (EGD) WITH PROPOFOL  N/A 11/28/2018   Procedure: ESOPHAGOGASTRODUODENOSCOPY (EGD) WITH PROPOFOL ;  Surgeon: Brice Campi Albino Alu., MD;  Location: [facility] ENDOSCOPY;  Service: Gastroenterology;  Laterality: N/A;   ESOPHAGOGASTRODUODENOSCOPY (EGD) WITH PROPOFOL  N/A 03/11/2020   Procedure: ESOPHAGOGASTRODUODENOSCOPY (EGD) WITH PROPOFOL ;  Surgeon: Brice Campi Albino Alu., MD;  Location: [facility] ENDOSCOPY;  Service: Gastroenterology;  Laterality: N/A;   ESOPHAGOGASTRODUODENOSCOPY (EGD) WITH PROPOFOL  N/A 06/03/2020   Procedure: ESOPHAGOGASTRODUODENOSCOPY (EGD) WITH PROPOFOL ;  Surgeon: Brice Campi Albino Alu., MD;  Location: [facility] ENDOSCOPY;  Service: Gastroenterology;  Laterality: N/A;   EUS  04/01/2018   Procedure: FULL UPPER ENDOSCOPIC ULTRASOUND (EUS) RADIAL;  Surgeon: Brice Campi Albino Alu., MD;  Location: [facility] ENDOSCOPY;  Service: Gastroenterology;;   EUS N/A 09/12/2018   Procedure: UPPER ENDOSCOPIC ULTRASOUND  (EUS) RADIAL;  Surgeon: Normie Becton., MD;  Location: [facility] ENDOSCOPY;  Service: Gastroenterology;  Laterality: N/A;   FINE NEEDLE ASPIRATION  09/12/2018   Procedure: FINE NEEDLE ASPIRATION (FNA) LINEAR;  Surgeon: Normie Becton., MD;  Location: [facility] ENDOSCOPY;  Service: Gastroenterology;;   HAND SURGERY Left 2018   saw accident   HEMOSTASIS CLIP PLACEMENT  09/12/2018   Procedure: HEMOSTASIS CLIP PLACEMENT;  Surgeon: Normie Becton., MD;  Location: [facility] ENDOSCOPY;  Service: Gastroenterology;;   HEMOSTASIS CLIP PLACEMENT  06/03/2020   Procedure: HEMOSTASIS CLIP PLACEMENT;  Surgeon: Normie Becton., MD;  Location: [facility] ENDOSCOPY;  Service: Gastroenterology;;   I & D EXTREMITY Left 11/24/2016   Procedure: IRRIGATION AND DEBRIDEMENT LEFT HAND, THUMB, INDEX, MIDDLE, RING, AND SMALL FINGERS WITH RECONSTRUCTION;  Surgeon: Ronn Cohn, MD;  Location: [facility] OR;  Service: Orthopedics;  Laterality: Left;   KNEE ARTHROSCOPY Left    LEFT HEART CATH AND CORS/GRAFTS ANGIOGRAPHY N/A 01/05/2019   Procedure: LEFT HEART CATH AND CORS/GRAFTS ANGIOGRAPHY;  Surgeon: Sammy Crisp, MD;  [facility] INVASIVE CV LAB;  Service: Cardiovascular;  Laterality: N/A;   LUMBAR LAMINECTOMY     and fusion x 2   NASAL SINUS SURGERY     POPLITEAL SYNOVIAL CYST EXCISION     REMOVAL OF STONES  04/01/2018   Procedure: REMOVAL OF STONES;  Surgeon: Brice Campi Albino Alu., MD;  Location: [facility] ENDOSCOPY;  Service: Gastroenterology;;   REMOVAL OF STONES  09/12/2018   Procedure: REMOVAL OF STONES;  Surgeon: Normie Becton., MD;  Location: [facility] ENDOSCOPY;  Service: Gastroenterology;;   REMOVAL OF STONES  11/28/2018   Procedure: REMOVAL OF STONES;  Surgeon: Normie Becton., MD;  Location: [facility] ENDOSCOPY;  Service: Gastroenterology;;   REMOVAL OF STONES  03/11/2020   Procedure: REMOVAL OF STONES;  Surgeon: Normie Becton., MD;  Location: [facility]OSCOPY;  Service: Gastroenterology;;   REMOVAL  OF STONES  06/03/2020   Procedure: REMOVAL OF STONES;  Surgeon: Normie Becton., MD;  Location: [facility] ENDOSCOPY;  Service: Gastroenterology;;   ROBOT ASSITED LAPAROSCOPIC NEPHROURETERECTOMY Right 06/03/2022   Procedure: XI ROBOT ASSITED LAPAROSCOPIC NEPHROURETERECTOMY, FLEXIBLE CYSTOSCOPY;  Surgeon: Melody Spurling., MD;  Location: [facility] ORS;  Service: Urology;  Laterality: Right;  3 HRS   SAVORY DILATION N/A 09/12/2018   Procedure: SAVORY DILATION;  Surgeon: Brice Campi Albino Alu., MD;  Location: [facility] ENDOSCOPY;  Service: Gastroenterology;  Laterality: N/A;   SAVORY DILATION N/A 11/28/2018   Procedure: SAVORY DILATION;  Surgeon: Brice Campi Albino Alu., MD;  Location: [facility] ENDOSCOPY;  Service: Gastroenterology;  Laterality: N/A;   SEPTOPLASTY Bilateral 05/26/2021   Procedure: SEPTOPLASTY;  Surgeon: Reynold Caves, MD;  Location: [facility];  Service: ENT;  Laterality: Bilateral;   SPHINCTEROTOMY  04/01/2018   Procedure: [address];  Surgeon: Mansouraty, Albino Alu., MD;  Location: [facility] ENDOSCOPY;  Service: Gastroenterology;;   Alberteen Aloe CHOLANGIOSCOPY N/A 11/28/2018   Procedure: [id number] CHOLANGIOSCOPY;  Surgeon: Normie Becton., MD;  Location: [facility] ENDOSCOPY;  Service: Gastroenterology;  Laterality: N/A;   SPYGLASS CHOLANGIOSCOPY N/A 06/03/2020   Procedure: SPYGLASS CHOLANGIOSCOPY;  Surgeon: Normie Becton., MD;  Location: [facility] ENDOSCOPY;  Service: Gastroenterology;  Laterality: N/A;   STENT REMOVAL  09/12/2018   Procedure: STENT REMOVAL;  Surgeon: Normie Becton., MD;  Location: [facility] ENDOSCOPY;  Service: Gastroenterology;;   Yuvonne Herald REMOVAL  11/28/2018   Procedure: STENT REMOVAL;  Surgeon: Normie Becton., MD;  Location: [facility]  ENDOSCOPY;  Service: Gastroenterology;;   Yuvonne Herald REMOVAL  03/11/2020   Procedure: STENT REMOVAL;  Surgeon: Normie Becton., MD;  Location: [facility]OSCOPY;  Service: Gastroenterology;;   Jona Negro INJECTION  09/12/2018    Procedure: SUBMUCOSAL LIFTING INJECTION;  Surgeon: Normie Becton., MD;  Location: [facility] ENDOSCOPY;  Service: Gastroenterology;;    Social History   Socioeconomic History   Marital status: Widowed    Spouse name: Not on file   Number of children: Not on file   Years of education: Not on file   Highest education level: 12th grade  Occupational History   Not on file  Tobacco Use   Smoking status: Former    Current packs/day: 0.00    Average packs/day: 0.5 packs/day for 5.0 years (2.5 ttl pk-yrs)    Types: Cigarettes    Start date: 03/31/1971    Quit date: 03/30/1976    Years since quitting: 47.1   Smokeless tobacco: Never  Vaping Use   Vaping status: Never Used  Substance and Sexual Activity   Alcohol use: No    Alcohol/week: 0.0 standard drinks of alcohol   Drug use: No   Sexual activity: Yes  Other Topics Concern   Not on file  Social History Narrative   Not on file   Social Drivers of Health   Financial Resource Strain: Low Risk  (02/10/2023)   Overall Financial Resource Strain (CARDIA)    Difficulty of Paying Living Expenses: Not hard at all  Food Insecurity: No Food Insecurity (02/10/2023)   Hunger Vital Sign    Worried About Running Out of Food in the Last Year: Never true    Ran Out of Food in the Last Year: Never true  Transportation Needs: No Transportation Needs (02/10/2023)   PRAPARE - Administrator, Civil Service (Medical): No    Lack of Transportation (Non-Medical): No  Physical Activity: Inactive (02/10/2023)   Exercise Vital Sign    Days of Exercise per Week: 0 days    Minutes of Exercise per Session: 30 min  Stress: No Stress Concern Present (02/10/2023)   [employer] of Occupational Health - Occupational Stress Questionnaire    Feeling of Stress : Not at all  Social Connections: Moderately Integrated (02/10/2023)   Social Connection and Isolation Panel [NHANES]    Frequency of Communication with Friends and Family: More than three  times a week    Frequency of Social Gatherings with Friends and Family: Twice a week    Attends Religious Services: More than 4 times per year    Active Member of [employer] or Organizations: Yes    Attends Banker Meetings: More than 4 times per year    Marital Status: Widowed  Intimate Partner Violence: Not At Risk (06/02/2022)   Humiliation, Afraid, Rape, and Kick questionnaire    Fear of Current or Ex-Partner: No    Emotionally Abused: No    Physically Abused: No    Sexually Abused: No    Family History  Problem Relation Age of Onset   Melanoma Mother    Stroke Father    Hypertension Father    Coronary artery disease Other    Colon cancer Neg Hx    Esophageal cancer Neg Hx    Stomach cancer Neg Hx    Rectal cancer Neg Hx    Pancreatic cancer Neg Hx    Liver disease Neg Hx    Inflammatory bowel disease Neg Hx     Current Outpatient Medications on  File Prior to Visit  Medication Sig Dispense Refill   acetaminophen  (TYLENOL ) 500 MG tablet Take 1,000 mg by mouth every 6 (six) hours as needed for moderate pain.     amLODipine  (NORVASC ) 5 MG tablet TAKE 1 TABLET BY MOUTH DAILY 100 tablet 2   aspirin  EC 81 MG tablet Take 81 mg by mouth every evening. Swallow whole.     cetirizine (ZYRTEC) 10 MG tablet Take 10 mg by mouth daily as needed for allergies.     clopidogrel  (PLAVIX ) 75 MG tablet TAKE 1 TABLET BY MOUTH ONCE  DAILY 100 tablet 2   Coenzyme Q10 200 MG capsule Take 200 mg by mouth daily.     cyanocobalamin  (VITAMIN B12) 1000 MCG/ML injection INJECT 1 ML SUBCUTANEOUSLY ONCE EVERY MONTH 3 mL 0   doxycycline  (VIBRA -TABS) 100 MG tablet Take 1 tablet (100 mg total) by mouth 2 (two) times daily. 20 tablet 0   ezetimibe  (ZETIA ) 10 MG tablet TAKE 1 TABLET BY MOUTH DAILY 100 tablet 1   Fe Bisgly-Vit C-Vit B12-FA (GENTLE IRON PO) Take by mouth. daily     fluticasone  (FLONASE ) 50 MCG/ACT nasal spray Place 1 spray into both nostrils daily as needed for allergies.     isosorbide   mononitrate (IMDUR ) 60 MG 24 hr tablet TAKE 1 TABLET BY MOUTH DAILY 100 tablet 2   Menthol, Topical Analgesic, (BENGAY EX) Apply 1 application topically daily as needed (pain).     METAMUCIL FIBER PO Take 1 capsule by mouth in the morning.     Multiple Vitamin (MULTIVITAMIN) tablet Take 1 tablet by mouth daily.     Naphazoline-Pheniramine (OPCON-A) 0.027-0.315 % SOLN Place 1 drop into both eyes daily as needed (itching eyes).     nebivolol  (BYSTOLIC ) 10 MG tablet TAKE 1 TABLET BY MOUTH DAILY 100 tablet 3   NEEDLE, DISP, 25 G (B-D DISP NEEDLE 25GX1") 25G X 1" MISC Inject 1000 mcg into muscle once a month. 50 each 0   nitroGLYCERIN  (NITROSTAT ) 0.4 MG SL tablet DISSOLVE ONE TABLET UNDER THE TONGUE EVERY 5 MINUTES AS NEEDED FOR CHEST PAIN. (Patient not taking: Reported on 03/16/2023) 25 tablet 0   NON FORMULARY Diltiazem 2%/Lidocaine5% compound Use 3 x rectally daily for 2 months to heal anal fissure 30 g 1   oxyCODONE -acetaminophen  (PERCOCET) 10-325 MG tablet Take 1 tablet by mouth 2 (two) times daily. 60 tablet 0   oxyCODONE -acetaminophen  (PERCOCET) 10-325 MG tablet Take 1 tablet by mouth 2 (two) times daily. 60 tablet 0   oxyCODONE -acetaminophen  (PERCOCET) 10-325 MG tablet Take 1 tablet by mouth 2 (two) times daily. 60 tablet 0   pantoprazole  (PROTONIX ) 40 MG tablet TAKE 1 TABLET BY MOUTH ONCE  DAILY 100 tablet 2   polyethylene glycol powder (GLYCOLAX/MIRALAX) 17 GM/SCOOP powder Take 1 Container by mouth daily. As needed     prednisoLONE acetate (PRED FORTE) 1 % ophthalmic suspension Place 1 drop into both eyes in the morning, at noon, and at bedtime.     rOPINIRole  (REQUIP ) 2 MG tablet TAKE 1 TABLET BY MOUTH AT  BEDTIME 100 tablet 2   rosuvastatin  (CRESTOR ) 10 MG tablet TAKE 1 TABLET BY MOUTH DAILY 100 tablet 2   saccharomyces boulardii (FLORASTOR) 250 MG capsule Take 250 mg by mouth 2 (two) times daily.     venlafaxine  (EFFEXOR ) 75 MG tablet TAKE 1 TABLET BY MOUTH DAILY 90 tablet 1   No current  facility-administered medications on file prior to visit.    No Known Allergies  Physical Exam Vitals requested from patient and listed below if patient had equipment and was able to obtain at home for this virtual visit: There were no vitals filed for this visit. Estimated body mass index is 24.37 kg/m as calculated from the following:   Height as of 03/16/23: 5\' 9"  (1.753 m).   Weight as of 03/16/23: 165 lb (74.8 kg).  EKG (optional): deferred due to virtual visit  GENERAL: alert, oriented, no acute distress detected; full vision exam deferred due to pandemic and/or virtual encounter  PSYCH/NEURO: pleasant and cooperative, no obvious depression or anxiety, speech and thought processing grossly intact, Cognitive function grossly intact  Flowsheet Row Office Visit from 11/12/2022 in [facility] [place]  PHQ-9 Total Score 25           05/11/2023   10:53 AM 02/11/2023    8:13 AM 11/12/2022    7:20 AM 06/02/2022    9:53 AM 12/04/2021    9:39 AM  Depression screen PHQ 2/9  Decreased Interest 0 2 3 3 2   Down, Depressed, Hopeless 1 2 3 2 2   PHQ - 2 Score 1 4 6 5 4   Altered sleeping   3 3 2   Tired, decreased energy   3 3 1   Change in appetite   2 2 1   Feeling bad or failure about yourself    3 2 2   Trouble concentrating   3 3 3   Moving slowly or fidgety/restless   3 2 0  Suicidal thoughts   2 1 0  PHQ-9 Score   25 21 13   Difficult doing work/chores   Extremely dIfficult Very difficult Extremely dIfficult  He has had some depression for a long time, but reports no SI or desire for counseling, reports he has a strong faith.      05/29/2022    8:59 PM 06/02/2022    9:53 AM 11/12/2022    7:19 AM 02/11/2023    8:13 AM 05/11/2023   10:47 AM  Fall Risk  Falls in the past year? 1 1 1 1 1   Was there an injury with Fall? 0  0 0 0  Fall Risk Category Calculator 1  2 2 2   Fall risk Follow up  Falls evaluation completed Falls evaluation completed Falls  evaluation completed Falls evaluation completed;Education provided     SUMMARY AND PLAN:  Encounter for Medicare annual wellness exam   Discussed applicable health maintenance/preventive health measures and advised and referred or ordered per patient preferences: -he saw GI about the colonoscopy and opted not to do -declined the shingles vaccine -he reports he had the covid 19 vaccine already in the fall -report had diabetic eye exam -plans to do labs/ foot exam at in office visit  Health Maintenance  Topic Date Due   OPHTHALMOLOGY EXAM  Never done   COVID-19 Vaccine (6 - 2024-25 season) 09/20/2022   FOOT EXAM  03/04/2023   Diabetic kidney evaluation - Urine ACR  06/02/2023   Zoster Vaccines- Shingrix (1 of 2) 08/10/2023 (Originally 05/16/1959)   Colonoscopy  05/10/2024 (Originally 12/30/2022)   HEMOGLOBIN A1C  08/11/2023   INFLUENZA VACCINE  08/20/2023   Diabetic kidney evaluation - eGFR measurement  12/28/2023   Medicare Annual Wellness (AWV)  05/10/2024   DTaP/Tdap/Td (2 - Td or Tdap) 11/25/2026   Pneumonia Vaccine 12+ Years old  Completed   HPV VACCINES  Aged Out   Meningococcal B Vaccine  Aged [employer] and counseling on  the following was provided based on the above review of health and a plan/checklist for the patient, along with additional information discussed, was provided for the patient in the patient instructions :  -Provided counseling and plan for difficulty hearing  -Provided counseling and plan for increased risk of falling if applicable per above screening. Reviewed and  safe balance exercises that can be done at home to improve balance and discussed exercise guidelines for adults with include balance exercises at least 3 days per week. He reports he has had PT several times and has some exercises he is working on himself. -Advised and counseled on a healthy lifestyle - including the importance of a healthy diet, regular physical activity,  -Reviewed  patient's current diet. Advised and counseled on a whole foods based healthy diet. A summary of a healthy diet was provided in the Patient Instructions.  -reviewed patient's current physical activity level and discussed exercise guidelines for adults. Discussed community resources and ideas for safe exercise at home to assist in meeting exercise guideline recommendations in a safe and healthy way.  -Advise yearly dental visits at minimum and regular eye exams   Follow up: see patient instructions   Patient Instructions  I really enjoyed getting to talk with you today! I am available on Tuesdays and Thursdays for virtual visits if you have any questions or concerns, or if I can be of any further assistance.   CHECKLIST FROM ANNUAL WELLNESS VISIT:  -Follow up (please call to schedule if not scheduled after visit):   -yearly for annual wellness visit with primary care office  Here is a list of your preventive care/health maintenance measures and the plan for each if any are due:  PLAN For any measures below that may be due:  -can get labs and foot exam in office -for any vaccines you get, please let us  know so that we can update your record Health Maintenance  Topic Date Due   OPHTHALMOLOGY EXAM  Never done   COVID-19 Vaccine (6 - 2024-25 season) 09/20/2022   FOOT EXAM  03/04/2023   Diabetic kidney evaluation - Urine ACR  06/02/2023   Zoster Vaccines- Shingrix (1 of 2) 08/10/2023 (Originally 05/16/1959)   Colonoscopy  05/10/2024 (Originally 12/30/2022)   HEMOGLOBIN A1C  08/11/2023   INFLUENZA VACCINE  08/20/2023   Diabetic kidney evaluation - eGFR measurement  12/28/2023   Medicare Annual Wellness (AWV)  05/10/2024   DTaP/Tdap/Td (2 - Td or Tdap) 11/25/2026   Pneumonia Vaccine 89+ Years old  Completed   HPV VACCINES  Aged Out   Meningococcal B Vaccine  Aged Out    -See a dentist at least yearly  -Get your eyes checked and then per your eye specialist's recommendations  -Other  issues addressed today:   -I have included below further information regarding a healthy whole foods based diet, physical activity guidelines for adults, stress management and opportunities for social connections. I hope you find this information useful.   -----------------------------------------------------------------------------------------------------------------------------------------------------------------------------------------------------------------------------------------------------------    NUTRITION: -eat real food: lots of colorful vegetables (half the plate) and fruits -5-7 servings of vegetables and fruits per day (fresh or steamed is best), exp. 2 servings of vegetables with lunch and dinner and 2 servings of fruit per day. Berries and greens such as kale and collards are great choices.  -consume on a regular basis:  fresh fruits, fresh veggies, fish, nuts, seeds, healthy oils (such as olive oil, avocado oil), whole grains (make sure for bread/pasta/crackers/etc., that the first ingredient on  label contains the word "whole"), legumes. -can eat small amounts of dairy and lean meat (no larger than the palm of your hand), but avoid processed meats such as ham, bacon, lunch meat, etc. -drink water  -try to avoid fast food and pre-packaged foods, processed meat, ultra processed foods/beverages (donuts, candy, etc.) -most experts advise limiting sodium to < 2300mg  per day, should limit further is any chronic conditions such as high blood pressure, heart disease, diabetes, etc. The American Heart Association advised that < 1500mg  is is ideal -try to avoid foods/beverages that contain any ingredients with names you do not recognize  -try to avoid foods/beverages  with added sugar or sweeteners/sweets  -try to avoid sweet drinks (including diet drinks): soda, juice, Gatorade, sweet tea, power drinks, diet drinks -try to avoid white rice, white bread, pasta (unless whole  grain)  EXERCISE GUIDELINES FOR ADULTS: -if you wish to increase your physical activity, do so gradually and with the approval of your doctor -STOP and seek medical care immediately if you have any chest pain, chest discomfort or trouble breathing when starting or increasing exercise  -move and stretch your body, legs, feet and arms when sitting for long periods -Physical activity guidelines for optimal health in adults: -get at least 150 minutes per week of moderate exercise (can talk, but not sing); this is about 20-30 minutes of sustained activity 5-7 days per week or two 10-15 minute episodes of sustained activity 5-7 days per week -do some muscle building/resistance training/strength training at least 2 days per week  -balance exercises 3+ days per week:   Stand somewhere where you have something sturdy to hold onto if you lose balance    1) lift up on toes, then back down, start with 5x per day and work up to 20x   2) stand and lift one leg straight out to the side so that foot is a few inches of the floor, start with 5x each side and work up to 20x each side   3) stand on one foot, start with 5 seconds each side and work up to 20 seconds on each side  If you need ideas or help with getting more active:  -Silver sneakers https://tools.silversneakers.com  -Walk with a Doc: http://www.duncan-williams.com/  -try to include resistance (weight lifting/strength building) and balance exercises twice per week: or the following link for ideas: http://castillo-powell.com/  BuyDucts.dk  STRESS MANAGEMENT: -can try meditating, or just sitting quietly with deep breathing while intentionally relaxing all parts of your body for 5 minutes daily -if you need further help with stress, anxiety or depression please follow up with your primary doctor or contact the wonderful folks at [facility]:  [phone]  SOCIAL CONNECTIONS: -options in [place] if you wish to engage in more social and exercise related activities:  -Silver sneakers https://tools.silversneakers.com  -Walk with a Doc: http://www.duncan-williams.com/  -Check out the [facility] Active Adults 50+ section on the [place] of [employer] (hiking clubs, book clubs, cards and games, chess, exercise classes, aquatic classes and much more) - see the website for details: https://www.[place].gov/departments/parks-recreation/active-adults50  -YouTube has lots of exercise videos for different ages and abilities as well  -[facility] (a variety of indoor and outdoor inperson activities for adults). [phone]. [address]  -Virtual Online Classes (a variety of topics): see seniorplanet.org or call [phone]  -consider volunteering at a school, hospice center, church, senior center or elsewhere           Maurie Southern, DO

## 2023-05-11 NOTE — Patient Instructions (Signed)
 I really enjoyed getting to talk with you today! I am available on Tuesdays and Thursdays for virtual visits if you have any questions or concerns, or if I can be of any further assistance.   CHECKLIST FROM ANNUAL WELLNESS VISIT:  -Follow up (please call to schedule if not scheduled after visit):   -yearly for annual wellness visit with primary care office  Here is a list of your preventive care/health maintenance measures and the plan for each if any are due:  PLAN For any measures below that may be due:  -can get labs and foot exam in office -for any vaccines you get, please let us  know so that we can update your record Health Maintenance  Topic Date Due   OPHTHALMOLOGY EXAM  Never done   COVID-19 Vaccine (6 - 2024-25 season) 09/20/2022   FOOT EXAM  03/04/2023   Diabetic kidney evaluation - Urine ACR  06/02/2023   Zoster Vaccines- Shingrix (1 of 2) 08/10/2023 (Originally 05/16/1959)   Colonoscopy  05/10/2024 (Originally 12/30/2022)   HEMOGLOBIN A1C  08/11/2023   INFLUENZA VACCINE  08/20/2023   Diabetic kidney evaluation - eGFR measurement  12/28/2023   Medicare Annual Wellness (AWV)  05/10/2024   DTaP/Tdap/Td (2 - Td or Tdap) 11/25/2026   Pneumonia Vaccine 47+ Years old  Completed   HPV VACCINES  Aged Out   Meningococcal B Vaccine  Aged Out    -See a dentist at least yearly  -Get your eyes checked and then per your eye specialist's recommendations  -Other issues addressed today:   -I have included below further information regarding a healthy whole foods based diet, physical activity guidelines for adults, stress management and opportunities for social connections. I hope you find this information useful.    -----------------------------------------------------------------------------------------------------------------------------------------------------------------------------------------------------------------------------------------------------------    NUTRITION: -eat real food: lots of colorful vegetables (half the plate) and fruits -5-7 servings of vegetables and fruits per day (fresh or steamed is best), exp. 2 servings of vegetables with lunch and dinner and 2 servings of fruit per day. Berries and greens such as kale and collards are great choices.  -consume on a regular basis:  fresh fruits, fresh veggies, fish, nuts, seeds, healthy oils (such as olive oil, avocado oil), whole grains (make sure for bread/pasta/crackers/etc., that the first ingredient on label contains the word "whole"), legumes. -can eat small amounts of dairy and lean meat (no larger than the palm of your hand), but avoid processed meats such as ham, bacon, lunch meat, etc. -drink water  -try to avoid fast food and pre-packaged foods, processed meat, ultra processed foods/beverages (donuts, candy, etc.) -most experts advise limiting sodium to < 2300mg  per day, should limit further is any chronic conditions such as high blood pressure, heart disease, diabetes, etc. The American Heart Association advised that < 1500mg  is is ideal -try to avoid foods/beverages that contain any ingredients with names you do not recognize  -try to avoid foods/beverages  with added sugar or sweeteners/sweets  -try to avoid sweet drinks (including diet drinks): soda, juice, Gatorade, sweet tea, power drinks, diet drinks -try to avoid white rice, white bread, pasta (unless whole grain)  EXERCISE GUIDELINES FOR ADULTS: -if you wish to increase your physical activity, do so gradually and with the approval of your doctor -STOP and seek medical care immediately if you have any chest pain, chest discomfort or trouble breathing when starting or  increasing exercise  -move and stretch your body, legs, feet and arms when sitting for long periods -Physical activity guidelines for  optimal health in adults: -get at least 150 minutes per week of moderate exercise (can talk, but not sing); this is about 20-30 minutes of sustained activity 5-7 days per week or two 10-15 minute episodes of sustained activity 5-7 days per week -do some muscle building/resistance training/strength training at least 2 days per week  -balance exercises 3+ days per week:   Stand somewhere where you have something sturdy to hold onto if you lose balance    1) lift up on toes, then back down, start with 5x per day and work up to 20x   2) stand and lift one leg straight out to the side so that foot is a few inches of the floor, start with 5x each side and work up to 20x each side   3) stand on one foot, start with 5 seconds each side and work up to 20 seconds on each side  If you need ideas or help with getting more active:  -Silver sneakers https://tools.silversneakers.com  -Walk with a Doc: http://www.duncan-williams.com/  -try to include resistance (weight lifting/strength building) and balance exercises twice per week: or the following link for ideas: http://castillo-powell.com/  BuyDucts.dk  STRESS MANAGEMENT: -can try meditating, or just sitting quietly with deep breathing while intentionally relaxing all parts of your body for 5 minutes daily -if you need further help with stress, anxiety or depression please follow up with your primary doctor or contact the wonderful folks at [facility]: [phone]  SOCIAL CONNECTIONS: -options in [place] if you wish to engage in more social and exercise related activities:  -Silver sneakers https://tools.silversneakers.com  -Walk with a Doc: http://www.duncan-williams.com/  -Check out the [facility] Active Adults 50+  section on the [place] of [employer] (hiking clubs, book clubs, cards and games, chess, exercise classes, aquatic classes and much more) - see the website for details: https://www.[place].gov/departments/parks-recreation/active-adults50  -YouTube has lots of exercise videos for different ages and abilities as well  -[facility] (a variety of indoor and outdoor inperson activities for adults). [phone]. [address]  -Virtual Online Classes (a variety of topics): see seniorplanet.org or call [phone]  -consider volunteering at a school, hospice center, church, senior center or elsewhere

## 2023-05-12 ENCOUNTER — Ambulatory Visit (INDEPENDENT_AMBULATORY_CARE_PROVIDER_SITE_OTHER): Payer: Medicare Other | Admitting: Internal Medicine

## 2023-05-12 ENCOUNTER — Encounter: Payer: Self-pay | Admitting: Internal Medicine

## 2023-05-12 VITALS — BP 120/64 | HR 60 | Temp 97.8°F | Resp 16 | Ht 69.0 in | Wt 163.2 lb

## 2023-05-12 DIAGNOSIS — L219 Seborrheic dermatitis, unspecified: Secondary | ICD-10-CM

## 2023-05-12 DIAGNOSIS — E119 Type 2 diabetes mellitus without complications: Secondary | ICD-10-CM

## 2023-05-12 DIAGNOSIS — E782 Mixed hyperlipidemia: Secondary | ICD-10-CM | POA: Diagnosis not present

## 2023-05-12 DIAGNOSIS — G894 Chronic pain syndrome: Secondary | ICD-10-CM

## 2023-05-12 DIAGNOSIS — M170 Bilateral primary osteoarthritis of knee: Secondary | ICD-10-CM

## 2023-05-12 LAB — POCT GLYCOSYLATED HEMOGLOBIN (HGB A1C): Hemoglobin A1C: 5.5 % (ref 4.0–5.6)

## 2023-05-12 MED ORDER — OXYCODONE-ACETAMINOPHEN 10-325 MG PO TABS: 1.0000 | ORAL_TABLET | Freq: Two times a day (BID) | ORAL | 0 refills | Status: DC

## 2023-05-12 NOTE — Progress Notes (Signed)
 Established Patient Office Visit     CC/Reason for Visit: Follow-up chronic conditions, medication refills  HPI: [name] is a 83 y.o. male who is coming in today for the above mentioned reasons. Past Medical History is significant for: Type 2 diabetes, heart failure with preserved ejection fraction, chronic pain syndrome on oxycodone  on pain management contract requesting refills, hyperlipidemia.  He has a history of chronic seborrheic dermatitis of the scalp and is requesting a dermatology referral.  He also believes it is time to update his knee cortisone injections and is requesting a referral to orthopedist.   Past Medical/Surgical History: Past Medical History:  Diagnosis Date   Allergy     Anxiety    B12 deficiency anemia    Blood transfusion without reported diagnosis    CAD (coronary artery disease)    Cancer (HCC)    bladder, Right renal cell also   Chronic kidney disease    Colon polyps    COPD (chronic obstructive pulmonary disease) (HCC)    Depression    Diabetes mellitus (HCC)    Dyspnea    Esophagus, Barrett's    GERD (gastroesophageal reflux disease)    Headache    History of bladder cancer    Bladder cancer "8 times"   History of hiatal hernia    Hyperlipidemia    Hypertension    Localized osteoarthrosis, lower leg    Myocardial infarction (HCC) 2021   Pneumonia    Restless leg syndrome    Sleep apnea    does not wear cpap   Stenosis of esophagus     Past Surgical History:  Procedure Laterality Date   BILIARY BRUSHING  04/01/2018   Procedure: BILIARY BRUSHING;  Surgeon: Normie Becton., MD;  Location: [facility] ENDOSCOPY;  Service: Gastroenterology;;   BILIARY BRUSHING  09/12/2018   Procedure: BILIARY BRUSHING;  Surgeon: Normie Becton., MD;  Location: [facility] ENDOSCOPY;  Service: Gastroenterology;;   BILIARY BRUSHING  11/28/2018   Procedure: BILIARY BRUSHING;  Surgeon: Normie Becton., MD;  Location: [facility] ENDOSCOPY;   Service: Gastroenterology;;   BILIARY BRUSHING  03/11/2020   Procedure: BILIARY BRUSHING;  Surgeon: Normie Becton., MD;  Location: [facility] ENDOSCOPY;  Service: Gastroenterology;;   BILIARY DILATION  09/12/2018   Procedure: BILIARY DILATION;  Surgeon: Normie Becton., MD;  Location: [facility] ENDOSCOPY;  Service: Gastroenterology;;   BILIARY DILATION  11/28/2018   Procedure: BILIARY DILATION;  Surgeon: Normie Becton., MD;  Location: [facility] ENDOSCOPY;  Service: Gastroenterology;;   BILIARY DILATION  03/11/2020   Procedure: BILIARY DILATION;  Surgeon: Normie Becton., MD;  Location: [facility] ENDOSCOPY;  Service: Gastroenterology;;   BILIARY STENT PLACEMENT  04/01/2018   Procedure: BILIARY STENT PLACEMENT;  Surgeon: Normie Becton., MD;  Location: [facility] ENDOSCOPY;  Service: Gastroenterology;;   BILIARY STENT PLACEMENT  09/12/2018   Procedure: BILIARY STENT PLACEMENT;  Surgeon: Normie Becton., MD;  Location: [facility] ENDOSCOPY;  Service: Gastroenterology;;   BILIARY STENT PLACEMENT  11/28/2018   Procedure: BILIARY STENT PLACEMENT;  Surgeon: Normie Becton., MD;  Location: [facility] ENDOSCOPY;  Service: Gastroenterology;;   BIOPSY  04/01/2018   Procedure: BIOPSY;  Surgeon: Normie Becton., MD;  Location: [facility] ENDOSCOPY;  Service: Gastroenterology;;   BIOPSY  09/12/2018   Procedure: BIOPSY;  Surgeon: Normie Becton., MD;  Location: [facility] ENDOSCOPY;  Service: Gastroenterology;;   BIOPSY  11/28/2018   Procedure: BIOPSY;  Surgeon: Normie Becton., MD;  Location: [facility] ENDOSCOPY;  Service: Gastroenterology;;   BIOPSY  03/11/2020   Procedure: BIOPSY;  Surgeon: Normie Becton., MD;  Location: [facility] ENDOSCOPY;  Service: Gastroenterology;;   bladder cancer      x 8 cystoscopy   CERVICAL DISCECTOMY     ACDF   CHOLECYSTECTOMY     COLONOSCOPY  11/17/2005   normal    CORONARY ARTERY BYPASS GRAFT     x4   CORONARY STENT INTERVENTION N/A 01/05/2019   Procedure:  CORONARY STENT INTERVENTION;  Surgeon: Sammy Crisp, MD;  Location: [facility] INVASIVE CV LAB;  Service: Cardiovascular;  Laterality: N/A;   CYSTOSCOPY WITH BIOPSY N/A 03/27/2022   Procedure: CYSTOSCOPY WITH BIOPSY;  Surgeon: Homero Luster, MD;  Location: [facility] ORS;  Service: Urology;  Laterality: N/A;   CYSTOSCOPY WITH RETROGRADE PYELOGRAM, URETEROSCOPY AND STENT PLACEMENT Right 03/27/2022   Procedure: CYSTOSCOPY WITH RIGHT RETROGRADE PYELOGRAM, URETEROSCOPY AND STENT PLACEMENT urethral dilation;  Surgeon: Homero Luster, MD;  Location: [facility] ORS;  Service: Urology;  Laterality: Right;   ENDOSCOPIC MUCOSAL RESECTION  09/12/2018   Procedure: ENDOSCOPIC MUCOSAL RESECTION;  Surgeon: Brice Campi Albino Alu., MD;  Location: [facility] ENDOSCOPY;  Service: Gastroenterology;;   ENDOSCOPIC RETROGRADE CHOLANGIOPANCREATOGRAPHY (ERCP) WITH PROPOFOL  N/A 04/01/2018   Procedure: ENDOSCOPIC RETROGRADE CHOLANGIOPANCREATOGRAPHY (ERCP) WITH PROPOFOL ;  Surgeon: Normie Becton., MD;  Location: [facility] ENDOSCOPY;  Service: Gastroenterology;  Laterality: N/A;   ENDOSCOPIC RETROGRADE CHOLANGIOPANCREATOGRAPHY (ERCP) WITH PROPOFOL  N/A 09/12/2018   Procedure: ENDOSCOPIC RETROGRADE CHOLANGIOPANCREATOGRAPHY (ERCP) WITH PROPOFOL ;  Surgeon: Brice Campi Albino Alu., MD;  Location: [facility] ENDOSCOPY;  Service: Gastroenterology;  Laterality: N/A;   ENDOSCOPIC RETROGRADE CHOLANGIOPANCREATOGRAPHY (ERCP) WITH PROPOFOL  N/A 03/11/2020   Procedure: ENDOSCOPIC RETROGRADE CHOLANGIOPANCREATOGRAPHY (ERCP) WITH PROPOFOL ;  Surgeon: Brice Campi Albino Alu., MD;  Location: [facility] ENDOSCOPY;  Service: Gastroenterology;  Laterality: N/A;   ENDOSCOPIC RETROGRADE CHOLANGIOPANCREATOGRAPHY (ERCP) WITH PROPOFOL  N/A 06/03/2020   Procedure: ENDOSCOPIC RETROGRADE CHOLANGIOPANCREATOGRAPHY (ERCP) WITH PROPOFOL ;  Surgeon: Brice Campi Albino Alu., MD;  Location: [facility] ENDOSCOPY;  Service: Gastroenterology;  Laterality: N/A;   ERCP N/A 11/28/2018   Procedure: ENDOSCOPIC RETROGRADE  CHOLANGIOPANCREATOGRAPHY (ERCP) +EGD with spyglass;  Surgeon: Brice Campi Albino Alu., MD;  Location: [facility] ENDOSCOPY;  Service: Gastroenterology;  Laterality: N/A;   ESOPHAGOGASTRODUODENOSCOPY  04/29/2010   ESOPHAGOGASTRODUODENOSCOPY (EGD) WITH PROPOFOL  N/A 04/01/2018   Procedure: ESOPHAGOGASTRODUODENOSCOPY (EGD) WITH PROPOFOL ;  Surgeon: Brice Campi Albino Alu., MD;  Location: [facility] ENDOSCOPY;  Service: Gastroenterology;  Laterality: N/A;   ESOPHAGOGASTRODUODENOSCOPY (EGD) WITH PROPOFOL  N/A 09/12/2018   Procedure: ESOPHAGOGASTRODUODENOSCOPY (EGD) WITH PROPOFOL ;  Surgeon: Brice Campi Albino Alu., MD;  Location: [facility] ENDOSCOPY;  Service: Gastroenterology;  Laterality: N/A;   ESOPHAGOGASTRODUODENOSCOPY (EGD) WITH PROPOFOL  N/A 11/28/2018   Procedure: ESOPHAGOGASTRODUODENOSCOPY (EGD) WITH PROPOFOL ;  Surgeon: Brice Campi Albino Alu., MD;  Location: [facility] ENDOSCOPY;  Service: Gastroenterology;  Laterality: N/A;   ESOPHAGOGASTRODUODENOSCOPY (EGD) WITH PROPOFOL  N/A 03/11/2020   Procedure: ESOPHAGOGASTRODUODENOSCOPY (EGD) WITH PROPOFOL ;  Surgeon: Brice Campi Albino Alu., MD;  Location: [facility] ENDOSCOPY;  Service: Gastroenterology;  Laterality: N/A;   ESOPHAGOGASTRODUODENOSCOPY (EGD) WITH PROPOFOL  N/A 06/03/2020   Procedure: ESOPHAGOGASTRODUODENOSCOPY (EGD) WITH PROPOFOL ;  Surgeon: Brice Campi Albino Alu., MD;  Location: [facility] ENDOSCOPY;  Service: Gastroenterology;  Laterality: N/A;   EUS  04/01/2018   Procedure: FULL UPPER ENDOSCOPIC ULTRASOUND (EUS) RADIAL;  Surgeon: Normie Becton., MD;  Location: [facility] ENDOSCOPY;  Service: Gastroenterology;;   EUS N/A 09/12/2018   Procedure: UPPER ENDOSCOPIC ULTRASOUND (EUS) RADIAL;  Surgeon: Normie Becton., MD;  Location: [facility] ENDOSCOPY;  Service: Gastroenterology;  Laterality: N/A;   FINE NEEDLE ASPIRATION  09/12/2018   Procedure: FINE NEEDLE ASPIRATION (FNA) LINEAR;  Surgeon: Normie Becton., MD;  Location: [facility] ENDOSCOPY;  Service: Gastroenterology;;   HAND SURGERY  Left 2018   saw accident   HEMOSTASIS CLIP PLACEMENT  09/12/2018   Procedure: HEMOSTASIS CLIP PLACEMENT;  Surgeon: Normie Becton., MD;  Location: [facility] ENDOSCOPY;  Service: Gastroenterology;;   HEMOSTASIS CLIP PLACEMENT  06/03/2020   Procedure: HEMOSTASIS CLIP PLACEMENT;  Surgeon: Normie Becton., MD;  Location: [facility] ENDOSCOPY;  Service: Gastroenterology;;   I & D EXTREMITY Left 11/24/2016   Procedure: IRRIGATION AND DEBRIDEMENT LEFT HAND, THUMB, INDEX, MIDDLE, RING, AND SMALL FINGERS WITH RECONSTRUCTION;  Surgeon: Ronn Cohn, MD;  Location: [facility] OR;  Service: Orthopedics;  Laterality: Left;   KNEE ARTHROSCOPY Left    LEFT HEART CATH AND CORS/GRAFTS ANGIOGRAPHY N/A 01/05/2019   Procedure: LEFT HEART CATH AND CORS/GRAFTS ANGIOGRAPHY;  Surgeon: Sammy Crisp, MD;  [facility]: [facility] CV LAB;  Service: Cardiovascular;  Laterality: N/A;   LUMBAR LAMINECTOMY     and fusion x 2   NASAL SINUS SURGERY     POPLITEAL SYNOVIAL CYST EXCISION     REMOVAL OF STONES  04/01/2018   Procedure: REMOVAL OF STONES;  Surgeon: Brice Campi Albino Alu., MD;  Location: [facility] ENDOSCOPY;  Service: Gastroenterology;;   REMOVAL OF STONES  09/12/2018   Procedure: REMOVAL OF STONES;  Surgeon: Normie Becton., MD;  Location: [facility] ENDOSCOPY;  Service: Gastroenterology;;   REMOVAL OF STONES  11/28/2018   Procedure: REMOVAL OF STONES;  Surgeon: Normie Becton., MD;  Location: [facility] ENDOSCOPY;  Service: Gastroenterology;;   REMOVAL OF STONES  03/11/2020   Procedure: REMOVAL OF STONES;  Surgeon: Normie Becton., MD;  Location: [facility]OSCOPY;  Service: Gastroenterology;;   REMOVAL OF STONES  06/03/2020   Procedure: REMOVAL OF STONES;  Surgeon: Normie Becton., MD;  Location: [facility] ENDOSCOPY;  Service: Gastroenterology;;   ROBOT ASSITED LAPAROSCOPIC NEPHROURETERECTOMY Right 06/03/2022   Procedure: XI ROBOT ASSITED LAPAROSCOPIC NEPHROURETERECTOMY, FLEXIBLE CYSTOSCOPY;  Surgeon: Melody Spurling., MD;  Location: [facility] ORS;  Service: Urology;  Laterality: Right;  3 HRS   SAVORY DILATION N/A 09/12/2018   Procedure: SAVORY DILATION;  Surgeon: Brice Campi Albino Alu., MD;  Location: [facility] ENDOSCOPY;  Service: Gastroenterology;  Laterality: N/A;   SAVORY DILATION N/A 11/28/2018   Procedure: SAVORY DILATION;  Surgeon: Brice Campi Albino Alu., MD;  Location: [facility] ENDOSCOPY;  Service: Gastroenterology;  Laterality: N/A;   SEPTOPLASTY Bilateral 05/26/2021   Procedure: SEPTOPLASTY;  Surgeon: Reynold Caves, MD;  Location: [facility];  Service: ENT;  Laterality: Bilateral;   SPHINCTEROTOMY  04/01/2018   Procedure: [address];  Surgeon: Mansouraty, Albino Alu., MD;  Location: [facility] ENDOSCOPY;  Service: Gastroenterology;;   Alberteen Aloe CHOLANGIOSCOPY N/A 11/28/2018   Procedure: [id number] CHOLANGIOSCOPY;  Surgeon: Normie Becton., MD;  Location: [facility] ENDOSCOPY;  Service: Gastroenterology;  Laterality: N/A;   SPYGLASS CHOLANGIOSCOPY N/A 06/03/2020   Procedure: SPYGLASS CHOLANGIOSCOPY;  Surgeon: Normie Becton., MD;  Location: [facility] ENDOSCOPY;  Service: Gastroenterology;  Laterality: N/A;   STENT REMOVAL  09/12/2018   Procedure: STENT REMOVAL;  Surgeon: Normie Becton., MD;  Location: [facility] ENDOSCOPY;  Service: Gastroenterology;;   [place] REMOVAL  11/28/2018   Procedure: STENT REMOVAL;  Surgeon: Normie Becton., MD;  Location: [facility] ENDOSCOPY;  Service: Gastroenterology;;   Y[place]onne Herald REMOVAL  03/11/2020   Procedure: STENT REMOVAL;  Surgeon: Normie Becton., MD;  Location: [facility]OSCOPY;  Service: Gastroenterology;;   SUBMUCOSAL LIFTING INJECTION  09/12/2018   Procedure: SUBMUCOSAL LIFTING INJECTION;  Surgeon: Normie Becton., MD;  Location: [facility] ENDOSCOPY;  Service:  Gastroenterology;;    Social History:  reports that he quit smoking about 47 years ago. His smoking use included cigarettes. He started smoking about 52 years ago. He has a 2.5 pack-year smoking  history. He has never used smokeless tobacco. He reports that he does not drink alcohol and does not use drugs.  Allergies: No Known Allergies  Family History:  Family History  Problem Relation Age of Onset   Melanoma Mother    Stroke Father    Hypertension Father    Coronary artery disease Other    Colon cancer Neg Hx    Esophageal cancer Neg Hx    Stomach cancer Neg Hx    Rectal cancer Neg Hx    Pancreatic cancer Neg Hx    Liver disease Neg Hx    Inflammatory bowel disease Neg Hx      Current Outpatient Medications:    acetaminophen  (TYLENOL ) 500 MG tablet, Take 1,000 mg by mouth every 6 (six) hours as needed for moderate pain., Disp: , Rfl:    amLODipine  (NORVASC ) 5 MG tablet, TAKE 1 TABLET BY MOUTH DAILY, Disp: 100 tablet, Rfl: 2   aspirin  EC 81 MG tablet, Take 81 mg by mouth every evening. Swallow whole., Disp: , Rfl:    cetirizine (ZYRTEC) 10 MG tablet, Take 10 mg by mouth daily as needed for allergies., Disp: , Rfl:    clopidogrel  (PLAVIX ) 75 MG tablet, TAKE 1 TABLET BY MOUTH ONCE  DAILY, Disp: 100 tablet, Rfl: 2   Coenzyme Q10 200 MG capsule, Take 200 mg by mouth daily., Disp: , Rfl:    cyanocobalamin  (VITAMIN B12) 1000 MCG/ML injection, INJECT 1 ML SUBCUTANEOUSLY ONCE EVERY MONTH, Disp: 3 mL, Rfl: 0   doxycycline  (VIBRA -TABS) 100 MG tablet, Take 1 tablet (100 mg total) by mouth 2 (two) times daily., Disp: 20 tablet, Rfl: 0   ezetimibe  (ZETIA ) 10 MG tablet, TAKE 1 TABLET BY MOUTH DAILY, Disp: 100 tablet, Rfl: 1   Fe Bisgly-Vit C-Vit B12-FA (GENTLE IRON PO), Take by mouth. daily, Disp: , Rfl:    fluticasone  (FLONASE ) 50 MCG/ACT nasal spray, Place 1 spray into both nostrils daily as needed for allergies., Disp: , Rfl:    isosorbide  mononitrate (IMDUR ) 60 MG 24 hr tablet, TAKE 1 TABLET BY MOUTH DAILY, Disp: 100 tablet, Rfl: 2   Menthol, Topical Analgesic, (BENGAY EX), Apply 1 application topically daily as needed (pain)., Disp: , Rfl:    METAMUCIL FIBER PO, Take 1 capsule by  mouth in the morning., Disp: , Rfl:    Multiple Vitamin (MULTIVITAMIN) tablet, Take 1 tablet by mouth daily., Disp: , Rfl:    Naphazoline-Pheniramine (OPCON-A) 0.027-0.315 % SOLN, Place 1 drop into both eyes daily as needed (itching eyes)., Disp: , Rfl:    nebivolol  (BYSTOLIC ) 10 MG tablet, TAKE 1 TABLET BY MOUTH DAILY, Disp: 90 tablet, Rfl: 2   NEEDLE, DISP, 25 G (B-D DISP NEEDLE 25GX1") 25G X 1" MISC, Inject 1000 mcg into muscle once a month., Disp: 50 each, Rfl: 0   nitroGLYCERIN  (NITROSTAT ) 0.4 MG SL tablet, DISSOLVE ONE TABLET UNDER THE TONGUE EVERY 5 MINUTES AS NEEDED FOR CHEST PAIN., Disp: 25 tablet, Rfl: 0   NON FORMULARY, Diltiazem 2%/Lidocaine5% compound Use 3 x rectally daily for 2 months to heal anal fissure, Disp: 30 g, Rfl: 1   pantoprazole  (PROTONIX ) 40 MG tablet, TAKE 1 TABLET BY MOUTH ONCE  DAILY, Disp: 100 tablet, Rfl: 2   polyethylene glycol powder (GLYCOLAX/MIRALAX) 17 GM/SCOOP powder, Take 1 Container by  mouth daily. As needed, Disp: , Rfl:    prednisoLONE acetate (PRED FORTE) 1 % ophthalmic suspension, Place 1 drop into both eyes in the morning, at noon, and at bedtime., Disp: , Rfl:    rOPINIRole  (REQUIP ) 2 MG tablet, TAKE 1 TABLET BY MOUTH AT  BEDTIME, Disp: 100 tablet, Rfl: 2   rosuvastatin  (CRESTOR ) 10 MG tablet, TAKE 1 TABLET BY MOUTH DAILY, Disp: 90 tablet, Rfl: 2   saccharomyces boulardii (FLORASTOR) 250 MG capsule, Take 250 mg by mouth 2 (two) times daily., Disp: , Rfl:    venlafaxine  (EFFEXOR ) 75 MG tablet, TAKE 1 TABLET BY MOUTH DAILY, Disp: 90 tablet, Rfl: 1   oxyCODONE -acetaminophen  (PERCOCET) 10-325 MG tablet, Take 1 tablet by mouth 2 (two) times daily., Disp: 60 tablet, Rfl: 0   oxyCODONE -acetaminophen  (PERCOCET) 10-325 MG tablet, Take 1 tablet by mouth 2 (two) times daily., Disp: 60 tablet, Rfl: 0   oxyCODONE -acetaminophen  (PERCOCET) 10-325 MG tablet, Take 1 tablet by mouth 2 (two) times daily., Disp: 60 tablet, Rfl: 0  Review of Systems:  Negative unless  indicated in HPI.   Physical Exam: Vitals:   05/12/23 0814  BP: 120/64  Pulse: 60  Resp: 16  Temp: 97.8 F (36.6 C)  TempSrc: Oral  SpO2: 92%  Weight: 163 lb 3.2 oz (74 kg)  Height: 5\' 9"  (1.753 m)    Body mass index is 24.1 kg/m.   Physical Exam Vitals reviewed.  Constitutional:      Appearance: Normal appearance.  HENT:     Head: Normocephalic and atraumatic.  Eyes:     Conjunctiva/sclera: Conjunctivae normal.  Cardiovascular:     Rate and Rhythm: Normal rate and regular rhythm.  Pulmonary:     Effort: Pulmonary effort is normal.     Breath sounds: Normal breath sounds.  Skin:    General: Skin is warm and dry.  Neurological:     General: No focal deficit present.     Mental Status: He is alert and oriented to person, place, and time.  Psychiatric:        Mood and Affect: Mood normal.        Behavior: Behavior normal.        Thought Content: Thought content normal.        Judgment: Judgment normal.      Impression and Plan:  Diabetes mellitus type 2, noninsulin dependent (HCC) -     POCT glycosylated hemoglobin (Hb A1C) -     Collection capillary blood specimen  Chronic pain syndrome -     oxyCODONE -Acetaminophen ; Take 1 tablet by mouth 2 (two) times daily.  Dispense: 60 tablet; Refill: 0 -     oxyCODONE -Acetaminophen ; Take 1 tablet by mouth 2 (two) times daily.  Dispense: 60 tablet; Refill: 0 -     oxyCODONE -Acetaminophen ; Take 1 tablet by mouth 2 (two) times daily.  Dispense: 60 tablet; Refill: 0  Mixed hyperlipidemia  Seborrheic dermatitis of scalp -     Ambulatory referral to Dermatology  Primary osteoarthritis of both knees -     Ambulatory referral to Orthopedic Surgery  -Referrals to dermatology and orthopedics have been placed. - A1c demonstrates excellent control at 5.7.  - Cholesterol is at goal of 61.   Time spent:32 minutes reviewing chart, interviewing and examining patient and formulating plan of care.     Marguerita Shih, MD [facility]

## 2023-05-18 ENCOUNTER — Other Ambulatory Visit (INDEPENDENT_AMBULATORY_CARE_PROVIDER_SITE_OTHER)

## 2023-05-18 ENCOUNTER — Ambulatory Visit: Admitting: Orthopaedic Surgery

## 2023-05-18 DIAGNOSIS — G8929 Other chronic pain: Secondary | ICD-10-CM

## 2023-05-18 DIAGNOSIS — M25562 Pain in left knee: Secondary | ICD-10-CM

## 2023-05-18 DIAGNOSIS — M25511 Pain in right shoulder: Secondary | ICD-10-CM

## 2023-05-18 MED ORDER — BUPIVACAINE HCL 0.5 % IJ SOLN
2.0000 mL | INTRAMUSCULAR | Status: AC | PRN
  Administered 2023-05-18: 2 mL via INTRA_ARTICULAR

## 2023-05-18 MED ORDER — METHYLPREDNISOLONE ACETATE 40 MG/ML IJ SUSP
40.0000 mg | INTRAMUSCULAR | Status: AC | PRN
  Administered 2023-05-18: 40 mg via INTRA_ARTICULAR

## 2023-05-18 MED ORDER — LIDOCAINE HCL 1 % IJ SOLN
2.0000 mL | INTRAMUSCULAR | Status: AC | PRN
Start: 2023-05-18 — End: 2023-05-18
  Administered 2023-05-18: 2 mL

## 2023-05-18 NOTE — Progress Notes (Signed)
 Office Visit Note   Patient: [name]           Date of Birth: [birth year]           MRN: [id number] Visit Date: 05/18/2023              Requested by: [name], Jacob Coppersmith, MD [address] [place],  [place] [address] PCP: [name], Jacob Coppersmith, MD   Assessment & Plan: Visit Diagnoses:  1. Chronic right shoulder pain   2. Chronic pain of left knee     Plan: Assessment and Plan    Osteoarthritis of left knee Chronic osteoarthritis with severe pain and functional limitation due to advanced degenerative changes. - Administer cortisone injection to the left knee for pain relief.  Osteoarthritis of right shoulder Chronic osteoarthritis with pain and weakness, likely involving rotator cuff degeneration. - Schedule cortisone injection for the right shoulder with Dr. Vaughn Hughes.      Follow-Up Instructions: No follow-ups on file.   Orders:  Orders Placed This Encounter  Procedures   XR KNEE 3 VIEW LEFT   XR Shoulder Right   AMB referral to sports medicine   No orders of the defined types were placed in this encounter.     Procedures: Large Joint Inj: L knee on 05/18/2023 12:52 PM Details: 22 G needle Medications: 2 mL bupivacaine  0.5 %; 2 mL lidocaine  1 %; 40 mg methylPREDNISolone  acetate 40 MG/ML Outcome: tolerated well, no immediate complications Patient was prepped and draped in the usual sterile fashion.       Clinical Data: No additional findings.   Subjective: Chief Complaint  Patient presents with   Left Knee - Pain   Right Shoulder - Pain    HPI Discussed the use of AI scribe software for clinical note transcription with the patient, who gave verbal consent to proceed.  History of Present Illness   [name] "[name]" is an 83 year old male who presents for pain management due to severe knee and shoulder pain.  He experiences severe pain in both knees, with the left knee being particularly problematic. The pain is  described as 'bone on bone' and significantly limits his ability to leave the house and perform daily activities. He has previously considered knee replacement surgery but decided against it due to concerns about age and recovery. Injections have provided inconsistent relief in the past.  He also experiences significant pain in both shoulders, with the right shoulder being more affected. Movement exacerbates the pain, which radiates down his arm, and there is weakness when the rotator cuff is tested. The shoulder pain is similar in severity to his left knee pain.  He is seeking relief from his pain and is open to receiving injections to manage his symptoms. He desires a treatment plan that addresses both his knee and shoulder pain over time.      Review of Systems  Constitutional: Negative.   HENT: Negative.    Eyes: Negative.   Respiratory: Negative.    Cardiovascular: Negative.   Gastrointestinal: Negative.   Endocrine: Negative.   Genitourinary: Negative.   Skin: Negative.   Allergic/Immunologic: Negative.   Neurological: Negative.   Hematological: Negative.   Psychiatric/Behavioral: Negative.    All other systems reviewed and are negative.    Objective: Vital Signs: There were no vitals taken for this visit.  Physical Exam Vitals and nursing note reviewed.  Constitutional:      Appearance: He is well-developed.  HENT:  Head: Normocephalic and atraumatic.  Eyes:     Pupils: Pupils are equal, round, and reactive to light.  Pulmonary:     Effort: Pulmonary effort is normal.  Abdominal:     Palpations: Abdomen is soft.  Musculoskeletal:        General: Normal range of motion.     Cervical back: Neck supple.  Skin:    General: Skin is warm.  Neurological:     Mental Status: He is alert and oriented to person, place, and time.  Psychiatric:        Behavior: Behavior normal.        Thought Content: Thought content normal.        Judgment: Judgment normal.      Ortho Exam Exam of the right shoulder shows pain with range of motion and manual muscle testing of the rotator cuff.  He has pain with Hawkins impingement sign.  Exam of the left knee shows varus deformity and crepitus with range of motion.  Range of motion is 5 to 95 degrees with pain.  Medial joint line tenderness. Specialty Comments:  No specialty comments available.  Imaging: XR Shoulder Right Result Date: 05/18/2023 X-rays of the right shoulder show mild degenerative changes of the humeral head and degenerative spurring of the acromion.  XR KNEE 3 VIEW LEFT Result Date: 05/18/2023 X-rays of the left knee show advanced tricompartmental degenerative joint disease.  Bone-on-bone joint space narrowing.  Varus deformity.  Erosion of the medial femoral condyle.    PMFS History: Patient Active Problem List   Diagnosis Date Noted   Cancer of renal pelvis, right (HCC) 06/03/2022   Bloating 04/27/2020   Gas pain 04/27/2020   Abdominal cramping 04/27/2020   Functional abdominal pain syndrome 11/12/2019   Lower extremity edema 11/12/2019   Hyperlipidemia    History of cholecystectomy 01/19/2019   Chronic diarrhea 01/19/2019   Antiplatelet or antithrombotic long-term use 01/19/2019   Non-ST elevation (NSTEMI) myocardial infarction [facility])    Acute hypoxemic respiratory failure (HCC) 01/04/2019   Multifocal pneumonia 01/04/2019   Severe sepsis (HCC) 01/04/2019   Elevated troponin 01/04/2019   Acute on chronic diastolic (congestive) heart failure (HCC) 01/04/2019   Acute respiratory failure (HCC) 01/04/2019   elevated IgG4 11/01/2018   Choledocholithiasis 10/27/2018   Dilation of biliary tract 10/27/2018   Esophageal dysphagia 10/27/2018   Upper airway cough syndrome 07/28/2018   Pleural effusion on left 07/28/2018   History of pancreatitis 05/12/2018   Abnormal LFTs 05/12/2018   Abnormal findings on esophagogastroduodenoscopy (EGD) 05/12/2018   History of ERCP 05/12/2018    Anemia 05/12/2018   Biliary stricture    ESBL (extended spectrum beta-lactamase) producing bacteria infection 04/04/2018   Ascending cholangitis 04/01/2018   Diabetes mellitus type 2, noninsulin dependent (HCC) 03/31/2018   Pancreatitis 03/31/2018   Sepsis (HCC) 03/31/2018   Bacteremia due to Gram-negative bacteria 03/31/2018   Abdominal pain    Dilated bile duct    Acute biliary pancreatitis 03/30/2018   Leukocytosis 03/30/2018   CAD (coronary artery disease) 03/30/2018   Osteoarthritis of left knee 10/18/2017   Cervical radiculopathy 10/04/2017   Pain of left hand 07/19/2017   Pre-operative cardiovascular examination 06/16/2017   CKD (chronic kidney disease), stage III 06/16/2017   Carpal tunnel syndrome 05/27/2017   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 05/10/2017   Open fracture of base of middle phalanx of finger 02/17/2017   Laceration of index finger 02/17/2017   Laceration of nail bed of finger 02/17/2017   Laceration of  thumb 02/17/2017   Open fracture of distal phalanx of finger 02/17/2017   Open fractures of multiple sites of phalanx of left hand 11/24/2016   B12 deficiency 05/28/2015   GERD (gastroesophageal reflux disease) 11/16/2014   Hyperlipidemia LDL goal <70 11/21/2013   DOE (dyspnea on exertion) 11/21/2013   Other dysphagia 07/14/2013   History of esophageal stricture 07/14/2013   Hx of CABG 06/05/2013   Leg pain, bilateral 10/18/2012   Restless leg syndrome 06/21/2012   ULNAR NEUROPATHY, LEFT 08/05/2009   Urinary obstruction 10/19/2008   ACTINIC KERATOSIS 06/25/2008   DRY EYE SYNDROME 10/13/2007   CARCINOMA, BLADDER, HX OF 07/02/2007   BARRETTS ESOPHAGUS 05/23/2007   Hypothyroidism 04/08/2007   Other malaise and fatigue 04/08/2007   Essential hypertension 10/12/2006   ANEMIA, B12 DEFICIENCY 09/08/2006   Depression 07/29/2006   NEUROPATHY, IDIOPATHIC PERIPHERAL NEC 07/29/2006   Allergic rhinitis 07/29/2006   LOW BACK PAIN 07/29/2006   COLONIC  POLYPS 11/04/2000   Past Medical History:  Diagnosis Date   Allergy     Anxiety    B12 deficiency anemia    Blood transfusion without reported diagnosis    CAD (coronary artery disease)    Cancer (HCC)    bladder, Right renal cell also   Chronic kidney disease    Colon polyps    COPD (chronic obstructive pulmonary disease) (HCC)    Depression    Diabetes mellitus (HCC)    Dyspnea    Esophagus, Barrett's    GERD (gastroesophageal reflux disease)    Headache    History of bladder cancer    Bladder cancer "8 times"   History of hiatal hernia    Hyperlipidemia    Hypertension    Localized osteoarthrosis, lower leg    Myocardial infarction (HCC) 2021   Pneumonia    Restless leg syndrome    Sleep apnea    does not wear cpap   Stenosis of esophagus     Family History  Problem Relation Age of Onset   Melanoma Mother    Stroke Father    Hypertension Father    Coronary artery disease Other    Colon cancer Neg Hx    Esophageal cancer Neg Hx    Stomach cancer Neg Hx    Rectal cancer Neg Hx    Pancreatic cancer Neg Hx    Liver disease Neg Hx    Inflammatory bowel disease Neg Hx     Past Surgical History:  Procedure Laterality Date   BILIARY BRUSHING  04/01/2018   Procedure: BILIARY BRUSHING;  Surgeon: Normie Becton., MD;  Location: [facility] ENDOSCOPY;  Service: Gastroenterology;;   BILIARY BRUSHING  09/12/2018   Procedure: BILIARY BRUSHING;  Surgeon: Normie Becton., MD;  Location: [facility] ENDOSCOPY;  Service: Gastroenterology;;   BILIARY BRUSHING  11/28/2018   Procedure: BILIARY BRUSHING;  Surgeon: Normie Becton., MD;  Location: [facility] ENDOSCOPY;  Service: Gastroenterology;;   BILIARY BRUSHING  03/11/2020   Procedure: BILIARY BRUSHING;  Surgeon: Normie Becton., MD;  Location: [facility] ENDOSCOPY;  Service: Gastroenterology;;   BILIARY DILATION  09/12/2018   Procedure: BILIARY DILATION;  Surgeon: Normie Becton., MD;  Location: [facility] ENDOSCOPY;  Service:  Gastroenterology;;   BILIARY DILATION  11/28/2018   Procedure: BILIARY DILATION;  Surgeon: Normie Becton., MD;  Location: [facility] ENDOSCOPY;  Service: Gastroenterology;;   BILIARY DILATION  03/11/2020   Procedure: BILIARY DILATION;  Surgeon: Normie Becton., MD;  Location: [facility] ENDOSCOPY;  Service: Gastroenterology;;   BILIARY STENT PLACEMENT  04/01/2018   Procedure: BILIARY STENT PLACEMENT;  Surgeon: Brice Campi Albino Alu., MD;  Location: [facility] ENDOSCOPY;  Service: Gastroenterology;;   BILIARY STENT PLACEMENT  09/12/2018   Procedure: BILIARY STENT PLACEMENT;  Surgeon: Normie Becton., MD;  Location: [facility] ENDOSCOPY;  Service: Gastroenterology;;   BILIARY STENT PLACEMENT  11/28/2018   Procedure: BILIARY STENT PLACEMENT;  Surgeon: Normie Becton., MD;  Location: [facility] ENDOSCOPY;  Service: Gastroenterology;;   BIOPSY  04/01/2018   Procedure: BIOPSY;  Surgeon: Normie Becton., MD;  Location: [facility] ENDOSCOPY;  Service: Gastroenterology;;   BIOPSY  09/12/2018   Procedure: BIOPSY;  Surgeon: Normie Becton., MD;  Location: [facility] ENDOSCOPY;  Service: Gastroenterology;;   BIOPSY  11/28/2018   Procedure: BIOPSY;  Surgeon: Normie Becton., MD;  Location: [facility] ENDOSCOPY;  Service: Gastroenterology;;   BIOPSY  03/11/2020   Procedure: BIOPSY;  Surgeon: Normie Becton., MD;  Location: [facility] ENDOSCOPY;  Service: Gastroenterology;;   bladder cancer      x 8 cystoscopy   CERVICAL DISCECTOMY     ACDF   CHOLECYSTECTOMY     COLONOSCOPY  11/17/2005   normal    CORONARY ARTERY BYPASS GRAFT     x4   CORONARY STENT INTERVENTION N/A 01/05/2019   Procedure: CORONARY STENT INTERVENTION;  Surgeon: Sammy Crisp, MD;  Location: [facility] INVASIVE CV LAB;  Service: Cardiovascular;  Laterality: N/A;   CYSTOSCOPY WITH BIOPSY N/A 03/27/2022   Procedure: CYSTOSCOPY WITH BIOPSY;  Surgeon: Homero Luster, MD;  Location: [facility] ORS;  Service: Urology;  Laterality: N/A;   CYSTOSCOPY WITH  RETROGRADE PYELOGRAM, URETEROSCOPY AND STENT PLACEMENT Right 03/27/2022   Procedure: CYSTOSCOPY WITH RIGHT RETROGRADE PYELOGRAM, URETEROSCOPY AND STENT PLACEMENT urethral dilation;  Surgeon: Homero Luster, MD;  Location: [facility] ORS;  Service: Urology;  Laterality: Right;   ENDOSCOPIC MUCOSAL RESECTION  09/12/2018   Procedure: ENDOSCOPIC MUCOSAL RESECTION;  Surgeon: Brice Campi Albino Alu., MD;  Location: [facility] ENDOSCOPY;  Service: Gastroenterology;;   ENDOSCOPIC RETROGRADE CHOLANGIOPANCREATOGRAPHY (ERCP) WITH PROPOFOL  N/A 04/01/2018   Procedure: ENDOSCOPIC RETROGRADE CHOLANGIOPANCREATOGRAPHY (ERCP) WITH PROPOFOL ;  Surgeon: Normie Becton., MD;  Location: [facility] ENDOSCOPY;  Service: Gastroenterology;  Laterality: N/A;   ENDOSCOPIC RETROGRADE CHOLANGIOPANCREATOGRAPHY (ERCP) WITH PROPOFOL  N/A 09/12/2018   Procedure: ENDOSCOPIC RETROGRADE CHOLANGIOPANCREATOGRAPHY (ERCP) WITH PROPOFOL ;  Surgeon: Brice Campi Albino Alu., MD;  Location: [facility] ENDOSCOPY;  Service: Gastroenterology;  Laterality: N/A;   ENDOSCOPIC RETROGRADE CHOLANGIOPANCREATOGRAPHY (ERCP) WITH PROPOFOL  N/A 03/11/2020   Procedure: ENDOSCOPIC RETROGRADE CHOLANGIOPANCREATOGRAPHY (ERCP) WITH PROPOFOL ;  Surgeon: Brice Campi Albino Alu., MD;  Location: [facility] ENDOSCOPY;  Service: Gastroenterology;  Laterality: N/A;   ENDOSCOPIC RETROGRADE CHOLANGIOPANCREATOGRAPHY (ERCP) WITH PROPOFOL  N/A 06/03/2020   Procedure: ENDOSCOPIC RETROGRADE CHOLANGIOPANCREATOGRAPHY (ERCP) WITH PROPOFOL ;  Surgeon: Brice Campi Albino Alu., MD;  Location: [facility] ENDOSCOPY;  Service: Gastroenterology;  Laterality: N/A;   ERCP N/A 11/28/2018   Procedure: ENDOSCOPIC RETROGRADE CHOLANGIOPANCREATOGRAPHY (ERCP) +EGD with spyglass;  Surgeon: Brice Campi Albino Alu., MD;  Location: [facility] ENDOSCOPY;  Service: Gastroenterology;  Laterality: N/A;   ESOPHAGOGASTRODUODENOSCOPY  04/29/2010   ESOPHAGOGASTRODUODENOSCOPY (EGD) WITH PROPOFOL  N/A 04/01/2018   Procedure: ESOPHAGOGASTRODUODENOSCOPY (EGD) WITH  PROPOFOL ;  Surgeon: Brice Campi Albino Alu., MD;  Location: [facility] ENDOSCOPY;  Service: Gastroenterology;  Laterality: N/A;   ESOPHAGOGASTRODUODENOSCOPY (EGD) WITH PROPOFOL  N/A 09/12/2018   Procedure: ESOPHAGOGASTRODUODENOSCOPY (EGD) WITH PROPOFOL ;  Surgeon: Brice Campi Albino Alu., MD;  Location: [facility] ENDOSCOPY;  Service: Gastroenterology;  Laterality: N/A;   ESOPHAGOGASTRODUODENOSCOPY (EGD) WITH PROPOFOL  N/A 11/28/2018   Procedure: ESOPHAGOGASTRODUODENOSCOPY (EGD) WITH PROPOFOL ;  Surgeon: Brice Campi Albino Alu., MD;  Location: [facility] ENDOSCOPY;  Service: Gastroenterology;  Laterality: N/A;   ESOPHAGOGASTRODUODENOSCOPY (EGD) WITH PROPOFOL  N/A 03/11/2020   Procedure: ESOPHAGOGASTRODUODENOSCOPY (EGD) WITH PROPOFOL ;  Surgeon: Brice Campi Albino Alu., MD;  Location: [facility] ENDOSCOPY;  Service: Gastroenterology;  Laterality: N/A;   ESOPHAGOGASTRODUODENOSCOPY (EGD) WITH PROPOFOL  N/A 06/03/2020   Procedure: ESOPHAGOGASTRODUODENOSCOPY (EGD) WITH PROPOFOL ;  Surgeon: Brice Campi Albino Alu., MD;  Location: [facility] ENDOSCOPY;  Service: Gastroenterology;  Laterality: N/A;   EUS  04/01/2018   Procedure: FULL UPPER ENDOSCOPIC ULTRASOUND (EUS) RADIAL;  Surgeon: Normie Becton., MD;  Location: [facility] ENDOSCOPY;  Service: Gastroenterology;;   EUS N/A 09/12/2018   Procedure: UPPER ENDOSCOPIC ULTRASOUND (EUS) RADIAL;  Surgeon: Normie Becton., MD;  Location: [facility] ENDOSCOPY;  Service: Gastroenterology;  Laterality: N/A;   FINE NEEDLE ASPIRATION  09/12/2018   Procedure: FINE NEEDLE ASPIRATION (FNA) LINEAR;  Surgeon: Normie Becton., MD;  Location: [facility] ENDOSCOPY;  Service: Gastroenterology;;   HAND SURGERY Left 2018   saw accident   HEMOSTASIS CLIP PLACEMENT  09/12/2018   Procedure: HEMOSTASIS CLIP PLACEMENT;  Surgeon: Normie Becton., MD;  Location: [facility] ENDOSCOPY;  Service: Gastroenterology;;   HEMOSTASIS CLIP PLACEMENT  06/03/2020   Procedure: HEMOSTASIS CLIP PLACEMENT;  Surgeon: Normie Becton., MD;   Location: [facility]OSCOPY;  Service: Gastroenterology;;   I & D EXTREMITY Left 11/24/2016   Procedure: IRRIGATION AND DEBRIDEMENT LEFT HAND, THUMB, INDEX, MIDDLE, RING, AND SMALL FINGERS WITH RECONSTRUCTION;  Surgeon: Ronn Cohn, MD;  Location: [facility] OR;  Service: Orthopedics;  Laterality: Left;   KNEE ARTHROSCOPY Left    LEFT HEART CATH AND CORS/GRAFTS ANGIOGRAPHY N/A 01/05/2019   Procedure: LEFT HEART CATH AND CORS/GRAFTS ANGIOGRAPHY;  Surgeon: Sammy Crisp, MD;  Location: [facility] INVASIVE CV LAB;  Service: Cardiovascular;  Laterality: N/A;   LUMBAR LAMINECTOMY     and fusion x 2   NASAL SINUS SURGERY     POPLITEAL SYNOVIAL CYST EXCISION     REMOVAL OF STONES  04/01/2018   Procedure: REMOVAL OF STONES;  Surgeon: Brice Campi Albino Alu., MD;  Location: [facility] ENDOSCOPY;  Service: Gastroenterology;;   REMOVAL OF STONES  09/12/2018   Procedure: REMOVAL OF STONES;  Surgeon: Normie Becton., MD;  Location: [facility] ENDOSCOPY;  Service: Gastroenterology;;   REMOVAL OF STONES  11/28/2018   Procedure: REMOVAL OF STONES;  Surgeon: Normie Becton., MD;  Location: [facility] ENDOSCOPY;  Service: Gastroenterology;;   REMOVAL OF STONES  03/11/2020   Procedure: REMOVAL OF STONES;  Surgeon: Normie Becton., MD;  Location: [facility]OSCOPY;  Service: Gastroenterology;;   REMOVAL OF STONES  06/03/2020   Procedure: REMOVAL OF STONES;  Surgeon: Normie Becton., MD;  Location: [facility]OSCOPY;  Service: Gastroenterology;;   ROBOT ASSITED LAPAROSCOPIC NEPHROURETERECTOMY Right 06/03/2022   Procedure: XI ROBOT ASSITED LAPAROSCOPIC NEPHROURETERECTOMY, FLEXIBLE CYSTOSCOPY;  Surgeon: Melody Spurling., MD;  Location: [facility] ORS;  Service: Urology;  Laterality: Right;  3 HRS   SAVORY DILATION N/A 09/12/2018   Procedure: SAVORY DILATION;  Surgeon: Brice Campi Albino Alu., MD;  Location: [facility] ENDOSCOPY;  Service: Gastroenterology;  Laterality: N/A;   SAVORY DILATION N/A 11/28/2018   Procedure: SAVORY DILATION;   Surgeon: Brice Campi Albino Alu., MD;  Location: [facility] ENDOSCOPY;  Service: Gastroenterology;  Laterality: N/A;   SEPTOPLASTY Bilateral 05/26/2021   Procedure: SEPTOPLASTY;  Surgeon: Reynold Caves, MD;  Location: [facility];  Service: ENT;  Laterality: Bilateral;   SPHINCTEROTOMY  04/01/2018   Procedure: [address];  Surgeon: Mansouraty, Albino Alu., MD;  Location: [facility] ENDOSCOPY;  Service: Gastroenterology;;   Alberteen Aloe CHOLANGIOSCOPY N/A 11/28/2018   Procedure:  SPYGLASS CHOLANGIOSCOPY;  Surgeon: Brice Campi Albino Alu., MD;  Location: [facility] ENDOSCOPY;  Service: Gastroenterology;  Laterality: N/A;   SPYGLASS CHOLANGIOSCOPY N/A 06/03/2020   Procedure: SPYGLASS CHOLANGIOSCOPY;  Surgeon: Normie Becton., MD;  Location: [facility] ENDOSCOPY;  Service: Gastroenterology;  Laterality: N/A;   STENT REMOVAL  09/12/2018   Procedure: STENT REMOVAL;  Surgeon: Normie Becton., MD;  Location: [facility] ENDOSCOPY;  Service: Gastroenterology;;   Y[place]onne Herald REMOVAL  11/28/2018   Procedure: STENT REMOVAL;  Surgeon: Normie Becton., MD;  Location: [facility] ENDOSCOPY;  Service: Gastroenterology;;   Yuvonne Herald REMOVAL  03/11/2020   Procedure: STENT REMOVAL;  Surgeon: Normie Becton., MD;  Location: [facility]OS[facility];  Service: Gastroenterology;;   SUBMUCOSAL LIFTING INJECTION  09/12/2018   Procedure: SUBMUCOSAL LIFTING INJECTION;  Surgeon: Normie Becton., MD;  Location: [facility] ENDOSCOPY;  Service: Gastroenterology;;   Social History   Occupational History   Not on file  Tobacco Use   Smoking status: Former    Current packs/day: 0.00    Average packs/day: 0.5 packs/day for 5.0 years (2.5 ttl pk-yrs)    Types: Cigarettes    Start date: 03/31/1971    Quit date: 03/30/1976    Years since quitting: 47.1   Smokeless tobacco: Never  Vaping Use   Vaping status: Never Used  Substance and Sexual Activity   Alcohol use: No    Alcohol/week: 0.0 standard drinks of alcohol   Drug use: No   Sexual activity:  Yes

## 2023-05-19 ENCOUNTER — Ambulatory Visit: Admitting: Pulmonary Disease

## 2023-05-19 ENCOUNTER — Encounter: Payer: Self-pay | Admitting: Pulmonary Disease

## 2023-05-19 VITALS — BP 108/62 | HR 67 | Ht 69.0 in | Wt 167.2 lb

## 2023-05-19 DIAGNOSIS — Z87891 Personal history of nicotine dependence: Secondary | ICD-10-CM | POA: Diagnosis not present

## 2023-05-19 DIAGNOSIS — J309 Allergic rhinitis, unspecified: Secondary | ICD-10-CM | POA: Diagnosis not present

## 2023-05-19 DIAGNOSIS — J90 Pleural effusion, not elsewhere classified: Secondary | ICD-10-CM

## 2023-05-19 DIAGNOSIS — R0609 Other forms of dyspnea: Secondary | ICD-10-CM | POA: Diagnosis not present

## 2023-05-19 MED ORDER — TRELEGY ELLIPTA 200-62.5-25 MCG/ACT IN AEPB
1.0000 | INHALATION_SPRAY | Freq: Every day | RESPIRATORY_TRACT | Status: AC
Start: 2023-05-19 — End: ?

## 2023-05-19 MED ORDER — TRELEGY ELLIPTA 200-62.5-25 MCG/ACT IN AEPB: 1.0000 | INHALATION_SPRAY | Freq: Every day | RESPIRATORY_TRACT | 11 refills | Status: DC

## 2023-05-19 NOTE — Patient Instructions (Addendum)
 Nice to meet you  I think to try strong inhaler for something like asthma, this may not be the right diagnosis but based on your prior pulmonary function test and CT images the lungs otherwise look good  I think you could be short of breath related to thickening of the lining of the lung on the left, this may not let the left lung expand totally and this can cause shortness of breath.  Unfortunate is no fix for that.  In addition I think your heart is contributing, you have a leaky valve and evidence this has been chronic and putting extra pressure on the lungs which can make people very short of breath with exertion.  We will get pulmonary function test for further evaluation  Use Trelegy 1 puff once a day.  Rinse your  mouth out with every use.  I provided samples.  Look on the front of the inhaler it says have any doses are left.  Please make sure you do not use an empty inhaler.  Is easy to do.  I sent a prescription as well to the pharmacy.  If it is too expensive please to me a message and I will look for an alternative.  Return to clinic in 3 months or sooner as needed with Dr. Marygrace Snellen after PFTs, please schedule ASAP next available

## 2023-05-19 NOTE — Progress Notes (Signed)
 @Patient  ID: [name], male    DOB: [birth year], 83 y.o.   MRN: [id number]  Chief Complaint  Patient presents with   Establish Care    Dyspnea with activity x several years    Referring provider: Devorah Fonder  HPI:   83 y.o. man with CAD status post CABG 2002 as well as a history of heart failure with recovered EF who were seen for evaluation of dyspnea on exertion.  Multiple cardiology notes reviewed.  Multiple prior pulmonary notes many years ago reviewed.  Patient states has been short of breath his whole life.  Had breathing issues his whole life.  As a child in his adult.  I guess he just longer live with it.  Eventually he was diagnosed with significant CAD in 2002 and underwent multivessel CABG.  This did not improve his shortness of breath at all per his report.  He stated he could not keep up with his peers in cardiac rehab.  This is progressed over time.  He was seen  Questionaires / Pulmonary Flowsheets:   ACT:      No data to display          MMRC:     No data to display          Epworth:      No data to display          Tests:   FENO:  No results found for: "NITRICOXIDE"  PFT:     No data to display          WALK:     11/02/2018   11:01 AM 09/07/2018   10:46 AM 07/27/2018    3:59 PM  SIX MIN WALK  Supplimental Oxygen during Test? (L/min) No No No  Tech Comments: average pace/min SOB//lmr Pt. did first lap slow pace and second lap fast pace. He did get sob on second lap. ER average to moderate pace/SOB//lmr    Imaging: Personally reviewed and as per EMR in discussion this note XR Shoulder Right Result Date: 05/18/2023 X-rays of the right shoulder show mild degenerative changes of the humeral head and degenerative spurring of the acromion.  XR KNEE 3 VIEW LEFT Result Date: 05/18/2023 X-rays of the left knee show advanced tricompartmental degenerative joint disease.  Bone-on-bone joint space narrowing.  Varus  deformity.  Erosion of the medial femoral condyle.   Lab Results: Personally reviewed CBC    Component Value Date/Time   WBC 7.5 12/28/2022 1214   RBC 4.25 12/28/2022 1214   HGB 12.3 (L) 12/28/2022 1214   HGB 13.7 12/09/2020 0853   HGB 13.0 04/25/2007 1127   HCT 36.9 (L) 12/28/2022 1214   HCT 40.5 12/09/2020 0853   HCT 37.0 (L) 04/25/2007 1127   PLT 190.0 12/28/2022 1214   PLT 161 12/09/2020 0853   MCV 86.8 12/28/2022 1214   MCV 87 12/09/2020 0853   MCV 89.0 04/25/2007 1127   MCH 28.3 05/22/2022 0934   MCHC 33.2 12/28/2022 1214   RDW 14.2 12/28/2022 1214   RDW 13.0 12/09/2020 0853   RDW 13.8 04/25/2007 1127   LYMPHSABS 2.3 12/28/2022 1214   LYMPHSABS 2.5 04/25/2007 1127   MONOABS 0.7 12/28/2022 1214   MONOABS 0.6 04/25/2007 1127   EOSABS 0.4 12/28/2022 1214   EOSABS 0.0 04/25/2007 1127   BASOSABS 0.1 12/28/2022 1214   BASOSABS 0.0 04/25/2007 1127    BMET    Component Value Date/Time   NA 143 12/28/2022 1214  NA 144 12/09/2020 0853   K 5.3 No hemolysis seen (H) 12/28/2022 1214   CL 107 12/28/2022 1214   CO2 27 12/28/2022 1214   GLUCOSE 99 12/28/2022 1214   BUN 35 (H) 12/28/2022 1214   BUN 21 12/09/2020 0853   CREATININE 2.60 (H) 12/28/2022 1214   CREATININE 1.61 (H) 11/09/2019 0939   CALCIUM  8.8 12/28/2022 1214   GFRNONAA 30 (L) 06/04/2022 0503   GFRAA 47 (L) 11/13/2019 1006    BNP    Component Value Date/Time   BNP 190.9 (H) 05/10/2019 1634   BNP 1,392.1 (H) 01/04/2019 1327    ProBNP    Component Value Date/Time   PROBNP 150.0 (H) 07/27/2018 1618    Specialty Problems       Pulmonary Problems   Allergic rhinitis   Qualifier: Diagnosis of  By: Broadus Canes, LPN, [facility] [employer]ing       DOE (dyspnea on exertion)   Onset late 1990's  - Spirometry 10/22/09   FEV1 2.96 (86%)  With  fev1/fvc ratio 0.85 no graphics avail  - 07/27/2018   Walked RA  2 laps @  approx 236ft each @ avg pace  stopped due to  End of study, sob but not out of breath with sats still  94% -  D dimer 09/27/18 = 4.01 >>> perfusion lung scan 07/28/2018 > no evidence of pe - venous dopplers 07/29/18  done due to pos d dimer > neg bilaterally  - 09/07/2018   Walked RA  2 laps @  approx 250 ft each @ slow then fast pace  stopped due to  End of study, no sob and sats 94% at end of study       Pleural effusion on left   First noted 06/23/18 Abd ct f/u from pancreatitis -  Perfusion lung scan 07/28/18  neg for pe/ venous dopplers nl - L chest u/s  08/04/2018  No free effusion to tap - f/u cxr 11/02/2018 no change very small/ loculated L effsuion  - CT 01/04/2019  As dz //Pleura: Mild diffuse pleural thickening overlying the left Lung  - cxr 02/07/2019 residual scarring only, no further f/u needed        Upper airway cough syndrome   Onset "as far back as he can remember with sense of nasal congestion and pnds, better p sinus surgery 2006 [facility]  - Allergy  profile  08/08/2018 >  Eos 0.3 /  IgE  258 RAST Pos dog > cat  - Sinus CT 09/14/2018 : Very mild mucosal thickening right maxillary sinus, anterior left ethmoid air cells and left frontal sinus. Status post partial ethmoidectomy and bilateral maxillary antrostomy.      Acute hypoxemic respiratory failure (HCC)   Acute respiratory failure (HCC)   Multifocal pneumonia    No Known Allergies  Immunization History  Administered Date(s) Administered   Fluad Quad(high Dose 65+) 11/02/2018, 11/09/2019, 10/16/2021   Influenza Split 10/01/2010, 10/16/2011   Influenza Whole 01/20/2004, 11/24/2006, 10/19/2008, 10/22/2009   Influenza, High Dose Seasonal PF 10/19/2013, 10/22/2014, 09/30/2015, 10/26/2016, 11/19/2017, 11/01/2020, 11/04/2022   Influenza,inj,Quad PF,6+ Mos 10/18/2012   Influenza,inj,quad, With Preservative 10/19/2016   Moderna Sars-Covid-2 Vaccination 03/12/2019, 04/03/2019, 12/29/2019, 10/22/2020   Pfizer Covid-19 Vaccine Bivalent Booster 31yrs & up 10/22/2020   Pfizer(Comirnaty)Fall Seasonal Vaccine 12 years and older  11/19/2021   Pneumococcal Conjugate-13 05/29/2013   Pneumococcal Polysaccharide-23 10/22/2009   Tdap 11/24/2016   Tetanus 05/29/2013    Past Medical History:  Diagnosis Date   Allergy   Anxiety    B12 deficiency anemia    Blood transfusion without reported diagnosis    CAD (coronary artery disease)    Cancer (HCC)    bladder, Right renal cell also   Chronic kidney disease    Colon polyps    COPD (chronic obstructive pulmonary disease) (HCC)    Depression    Diabetes mellitus (HCC)    Dyspnea    Esophagus, Barrett's    GERD (gastroesophageal reflux disease)    Headache    History of bladder cancer    Bladder cancer "8 times"   History of hiatal hernia    Hyperlipidemia    Hypertension    Localized osteoarthrosis, lower leg    Myocardial infarction (HCC) 2021   Pneumonia    Restless leg syndrome    Sleep apnea    does not wear cpap   Stenosis of esophagus     Tobacco History: Social History   Tobacco Use  Smoking Status Former   Current packs/day: 0.00   Average packs/day: 0.5 packs/day for 5.0 years (2.5 ttl pk-yrs)   Types: Cigarettes   Start date: 03/31/1971   Quit date: 03/30/1976   Years since quitting: 47.1  Smokeless Tobacco Never   Counseling given: Not Answered   Continue to not smoke  Outpatient Encounter Medications as of 05/19/2023  Medication Sig   acetaminophen  (TYLENOL ) 500 MG tablet Take 1,000 mg by mouth every 6 (six) hours as needed for moderate pain.   amLODipine  (NORVASC ) 5 MG tablet TAKE 1 TABLET BY MOUTH DAILY   aspirin  EC 81 MG tablet Take 81 mg by mouth every evening. Swallow whole.   cetirizine (ZYRTEC) 10 MG tablet Take 10 mg by mouth daily as needed for allergies.   clopidogrel  (PLAVIX ) 75 MG tablet TAKE 1 TABLET BY MOUTH ONCE  DAILY   Coenzyme Q10 200 MG capsule Take 200 mg by mouth daily.   cyanocobalamin  (VITAMIN B12) 1000 MCG/ML injection INJECT 1 ML SUBCUTANEOUSLY ONCE EVERY MONTH   doxycycline  (VIBRA -TABS) 100 MG tablet  Take 1 tablet (100 mg total) by mouth 2 (two) times daily.   ezetimibe  (ZETIA ) 10 MG tablet TAKE 1 TABLET BY MOUTH DAILY   Fe Bisgly-Vit C-Vit B12-FA (GENTLE IRON PO) Take by mouth. daily   fluticasone  (FLONASE ) 50 MCG/ACT nasal spray Place 1 spray into both nostrils daily as needed for allergies.   Fluticasone -Umeclidin-Vilant (TRELEGY ELLIPTA) 200-62.5-25 MCG/ACT AEPB Inhale 1 puff into the lungs daily.   isosorbide  mononitrate (IMDUR ) 60 MG 24 hr tablet TAKE 1 TABLET BY MOUTH DAILY   Menthol, Topical Analgesic, (BENGAY EX) Apply 1 application topically daily as needed (pain).   METAMUCIL FIBER PO Take 1 capsule by mouth in the morning.   Multiple Vitamin (MULTIVITAMIN) tablet Take 1 tablet by mouth daily.   Naphazoline-Pheniramine (OPCON-A) 0.027-0.315 % SOLN Place 1 drop into both eyes daily as needed (itching eyes).   nebivolol  (BYSTOLIC ) 10 MG tablet TAKE 1 TABLET BY MOUTH DAILY   NEEDLE, DISP, 25 G (B-D DISP NEEDLE 25GX1") 25G X 1" MISC Inject 1000 mcg into muscle once a month.   nitroGLYCERIN  (NITROSTAT ) 0.4 MG SL tablet DISSOLVE ONE TABLET UNDER THE TONGUE EVERY 5 MINUTES AS NEEDED FOR CHEST PAIN.   NON FORMULARY Diltiazem 2%/Lidocaine5% compound Use 3 x rectally daily for 2 months to heal anal fissure   oxyCODONE -acetaminophen  (PERCOCET) 10-325 MG tablet Take 1 tablet by mouth 2 (two) times daily.   oxyCODONE -acetaminophen  (PERCOCET) 10-325 MG tablet Take 1 tablet by mouth 2 (  two) times daily.   oxyCODONE -acetaminophen  (PERCOCET) 10-325 MG tablet Take 1 tablet by mouth 2 (two) times daily.   pantoprazole  (PROTONIX ) 40 MG tablet TAKE 1 TABLET BY MOUTH ONCE  DAILY   polyethylene glycol powder (GLYCOLAX/MIRALAX) 17 GM/SCOOP powder Take 1 Container by mouth daily. As needed   prednisoLONE acetate (PRED FORTE) 1 % ophthalmic suspension Place 1 drop into both eyes in the morning, at noon, and at bedtime.   rOPINIRole  (REQUIP ) 2 MG tablet TAKE 1 TABLET BY MOUTH AT  BEDTIME   rosuvastatin   (CRESTOR ) 10 MG tablet TAKE 1 TABLET BY MOUTH DAILY   saccharomyces boulardii (FLORASTOR) 250 MG capsule Take 250 mg by mouth 2 (two) times daily.   venlafaxine  (EFFEXOR ) 75 MG tablet TAKE 1 TABLET BY MOUTH DAILY   No facility-administered encounter medications on file as of 05/19/2023.     Review of Systems  Review of Systems  No chest pain with exertion.  No orthopnea or PND.  Comprehensive review of systems otherwise negative. Physical Exam  BP 108/62   Pulse 67   Ht 5\' 9"  (1.753 m)   Wt 167 lb 3.2 oz (75.8 kg)   SpO2 95%   BMI 24.69 kg/m   Wt Readings from Last 5 Encounters:  05/19/23 167 lb 3.2 oz (75.8 kg)  05/12/23 163 lb 3.2 oz (74 kg)  03/16/23 165 lb (74.8 kg)  02/11/23 167 lb 3.2 oz (75.8 kg)  12/28/22 164 lb (74.4 kg)    BMI Readings from Last 5 Encounters:  05/19/23 24.69 kg/m  05/12/23 24.10 kg/m  03/16/23 24.37 kg/m  02/11/23 24.69 kg/m  12/28/22 24.22 kg/m     Physical Exam General: Sitting in chair, no acute distress Eyes: EOMI, no icterus Neck: Supple, no JVP Pulmonary: Clear, normal work of breathing Cardiovascular: Warm, no edema noted MSK: No synovitis, no joint effusion Neuro: Normal gait, no weakness Psych: Normal mood, full affect   Assessment & Plan:   Dyspnea on exertion: Suspect multifactorial largely due to deconditioning given gradual decrease in activity with multiple medical complications and hospitalizations over the years.  As well as decline with age.  In the setting of baseline shortness of breath his whole life, no improvement after CABG in the early 2000's.  Likely element of cardiac dysfunction given his dilatation and mild MVR, suspect this causes shortness of breath with transient pulmonary venous hypertension with normal physiology of exercise.  His chest parenchyma is relatively clear on serial chest images with exception of chronic pleural scarring or thickening.  Quite possible there is restrictive physiology from  this.  There is no real fix or solution to this.  Will obtain PFTs for further evaluation.  Trial of Trelegy aggressive in case asthma is the reason for his lifelong dyspnea.  This seems less likely.  Inhalers have not helped in the past.   Return in about 3 months (around 08/18/2023) for f/u Dr. Marygrace Snellen, after PFT.   Guerry Leek, MD 05/19/2023   This appointment required 60 minutes of patient care (this includes precharting, chart review, review of results, face-to-face care, etc.).

## 2023-05-20 ENCOUNTER — Other Ambulatory Visit: Payer: Self-pay | Admitting: Internal Medicine

## 2023-05-30 ENCOUNTER — Other Ambulatory Visit: Payer: Self-pay | Admitting: Internal Medicine

## 2023-05-31 ENCOUNTER — Ambulatory Visit: Admitting: Sports Medicine

## 2023-06-12 ENCOUNTER — Other Ambulatory Visit: Payer: Self-pay | Admitting: Internal Medicine

## 2023-06-15 ENCOUNTER — Other Ambulatory Visit: Payer: Self-pay | Admitting: Internal Medicine

## 2023-06-15 DIAGNOSIS — E038 Other specified hypothyroidism: Secondary | ICD-10-CM

## 2023-06-17 DIAGNOSIS — C678 Malignant neoplasm of overlapping sites of bladder: Secondary | ICD-10-CM | POA: Diagnosis not present

## 2023-06-21 ENCOUNTER — Other Ambulatory Visit (HOSPITAL_COMMUNITY): Payer: Self-pay | Admitting: Urology

## 2023-06-21 DIAGNOSIS — C678 Malignant neoplasm of overlapping sites of bladder: Secondary | ICD-10-CM

## 2023-06-21 DIAGNOSIS — C651 Malignant neoplasm of right renal pelvis: Secondary | ICD-10-CM

## 2023-06-22 ENCOUNTER — Encounter: Payer: Self-pay | Admitting: Sports Medicine

## 2023-06-22 ENCOUNTER — Ambulatory Visit (INDEPENDENT_AMBULATORY_CARE_PROVIDER_SITE_OTHER): Admitting: Sports Medicine

## 2023-06-22 ENCOUNTER — Other Ambulatory Visit: Payer: Self-pay

## 2023-06-22 DIAGNOSIS — G8929 Other chronic pain: Secondary | ICD-10-CM | POA: Diagnosis not present

## 2023-06-22 DIAGNOSIS — M19011 Primary osteoarthritis, right shoulder: Secondary | ICD-10-CM | POA: Diagnosis not present

## 2023-06-22 DIAGNOSIS — M25511 Pain in right shoulder: Secondary | ICD-10-CM | POA: Diagnosis not present

## 2023-06-22 MED ORDER — BUPIVACAINE HCL 0.25 % IJ SOLN
2.0000 mL | INTRAMUSCULAR | Status: AC | PRN
  Administered 2023-06-22: 2 mL via INTRA_ARTICULAR

## 2023-06-22 MED ORDER — METHYLPREDNISOLONE ACETATE 40 MG/ML IJ SUSP
2.0000 mL | INTRAMUSCULAR | Status: AC | PRN
Start: 2023-06-22 — End: 2023-06-22
  Administered 2023-06-22: 2 mL via INTRA_ARTICULAR

## 2023-06-22 MED ORDER — LIDOCAINE HCL 1 % IJ SOLN
2.0000 mL | INTRAMUSCULAR | Status: AC | PRN
Start: 2023-06-22 — End: 2023-06-22
  Administered 2023-06-22: 2 mL

## 2023-06-22 NOTE — Progress Notes (Signed)
 Office Visit Note   Patient: [name]           Date of Birth: 02-21-1940           MRN: [id number] Visit Date: 06/22/2023              Requested by: Zilphia Hilt, Charyl Coppersmith, MD [address] [place],  [place] [address] PCP: Zilphia Hilt, Charyl Coppersmith, MD  Medical Resident, Sports Medicine Fellow - Attending Physician Addendum:   I have independently interviewed and examined the patient myself. I have discussed the above with the original author and agree with their documentation. My edits for correction/addition/clarification have been made, see any changes above and below.   In summary, pleasant 83 year old male with chronic right shoulder pain with an exacerbation.  This is likely multifactorial with mild to moderate osteoarthritic and glenohumeral joint generative changes but also with some rotator cuff arthropathy.  Through shared decision making, did proceed with ultrasound-guided glenohumeral joint injection, patient tolerated well.  Advised on postinjection protocol.  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  We could consider formal PT only if he does not improve with the above.  He will follow-up with Dr. Christiane Cowing as indicated; we are happy to see him back as needed.  Shauna Del, DO Primary Care Sports Medicine Physician  [facility] - Orthopedics   Assessment & Plan: Visit Diagnoses:  1. Chronic right shoulder pain   2. Primary osteoarthritis, right shoulder    Plan: Patient presenting with right shoulder pain that is likely multifactorial in nature with some glenohumeral joint degeneration as well as possible rotator cuff pathology.  Will go ahead and proceed with glenohumeral joint injection at this time under US -guidance, tolerated well.  Patient tolerated procedure well, advised patient to follow-up with Dr.Xu.  Also advised patient to take it easy  over the next 2 days and can then start to increase activity as tolerated with his shoulder.  Patient understanding and agreeable with plan.  Follow-Up Instructions: Dr. Christiane Cowing as needed  Orders:  Orders Placed This Encounter  Procedures   Large Joint Inj: R glenohumeral   US  Guided Needle Placement - No Linked Charges   No orders of the defined types were placed in this encounter.     Procedures: Large Joint Inj: R glenohumeral on 06/22/2023 8:56 AM Details: 22 G 3.5 in needle, ultrasound-guided Medications: 2 mL lidocaine  1 %; 2 mL bupivacaine  0.25 %; 2 mL methylPREDNISolone  acetate 40 MG/ML  US -guided glenohumeral joint injection, Right shoulder After discussion on risks/benefits/indications, informed verbal consent was obtained. A timeout was then performed. The patient was positioned lying lateral recumbent on examination table. The patient's shoulder was prepped with betadine and multiple alcohol swabs and utilizing ultrasound guidance, the patient's glenohumeral joint was identified on ultrasound. Using ultrasound guidance a 22-gauge, 3.5 inch needle with a mixture of 2:2:2 cc's lidocaine :bupivicaine:depomedrol was directed from a lateral to medial direction via in-plane technique into the glenohumeral joint with visualization of appropriate spread of injectate into the joint. Patient tolerated the procedure well without immediate complications.   Procedure performed with Dr. Francina Irish     Procedure, treatment alternatives, risks and benefits explained, specific risks discussed. Consent was given by the patient. Patient was prepped and draped in the usual sterile fashion.       Clinical Data: No additional findings.   Subjective: Chief  Complaint  Patient presents with   Right Shoulder - Pain     Patient is presenting for right shoulder pain.  Patient notes that he has been dealing with a few years now but notes that past 2 years.  Patient is very active manages some [employer]  and  states that he is not as active with the form as he like to be.  Patient recently saw Dr. Christiane Cowing and was noted to have some osteoarthritic change of the right shoulder as well as some possible rotator cuff pathology.  Patient states that he is comfortable range of motion 90 degrees he has some pain.  Patient was advised to have a shoulder injection into the glenohumeral joint.  Patient notes that on his left shoulder he was told he had bursitis in the past and injection of the subacromial space and had significant amount relief    Review of Systems   Objective:  Physical Exam  Ortho Exam Inspection reveals no gross abnormalities of the right shoulder.  Range of motion is full with some mildly decreased external rotation.  Abduction is full with some pain noted after 90 degrees of abduction.  Internal rotation is intact.  There are some weakness with abduction against resistance, as well as some pain with external rotation against resistance.  Neer's is positive, empty cans is positive. Specialty Comments:  No specialty comments available.  Imaging:  05/18/23: X-rays of the right shoulder show mild degenerative changes of the humeral  head and degenerative spurring of the acromion.   PMFS History: Patient Active Problem List   Diagnosis Date Noted   Cancer of renal pelvis, right (HCC) 06/03/2022   Bloating 04/27/2020   Gas pain 04/27/2020   Abdominal cramping 04/27/2020   Functional abdominal pain syndrome 11/12/2019   Lower extremity edema 11/12/2019   Hyperlipidemia    History of cholecystectomy 01/19/2019   Chronic diarrhea 01/19/2019   Antiplatelet or antithrombotic long-term use 01/19/2019   Non-ST elevation (NSTEMI) myocardial infarction [facility])    Acute hypoxemic respiratory failure (HCC) 01/04/2019   Multifocal pneumonia 01/04/2019   Severe sepsis (HCC) 01/04/2019   Elevated troponin 01/04/2019   Acute on chronic diastolic (congestive) heart failure (HCC) 01/04/2019   Acute  respiratory failure (HCC) 01/04/2019   elevated IgG4 11/01/2018   Choledocholithiasis 10/27/2018   Dilation of biliary tract 10/27/2018   Esophageal dysphagia 10/27/2018   Upper airway cough syndrome 07/28/2018   Pleural effusion on left 07/28/2018   History of pancreatitis 05/12/2018   Abnormal LFTs 05/12/2018   Abnormal findings on esophagogastroduodenoscopy (EGD) 05/12/2018   History of ERCP 05/12/2018   Anemia 05/12/2018   Biliary stricture    ESBL (extended spectrum beta-lactamase) producing bacteria infection 04/04/2018   Ascending cholangitis 04/01/2018   Diabetes mellitus type 2, noninsulin dependent (HCC) 03/31/2018   Pancreatitis 03/31/2018   Sepsis (HCC) 03/31/2018   Bacteremia due to Gram-negative bacteria 03/31/2018   Abdominal pain    Dilated bile duct    Acute biliary pancreatitis 03/30/2018   Leukocytosis 03/30/2018   CAD (coronary artery disease) 03/30/2018   Osteoarthritis of left knee 10/18/2017   Cervical radiculopathy 10/04/2017   Pain of left hand 07/19/2017   Pre-operative cardiovascular examination 06/16/2017   CKD (chronic kidney disease), stage III 06/16/2017   Carpal tunnel syndrome 05/27/2017   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 05/10/2017   Open fracture of base of middle phalanx of finger 02/17/2017   Laceration of index finger 02/17/2017   Laceration of nail bed of finger 02/17/2017  Laceration of thumb 02/17/2017   Open fracture of distal phalanx of finger 02/17/2017   Open fractures of multiple sites of phalanx of left hand 11/24/2016   B12 deficiency 05/28/2015   GERD (gastroesophageal reflux disease) 11/16/2014   Hyperlipidemia LDL goal <70 11/21/2013   DOE (dyspnea on exertion) 11/21/2013   Other dysphagia 07/14/2013   History of esophageal stricture 07/14/2013   Hx of CABG 06/05/2013   Leg pain, bilateral 10/18/2012   Restless leg syndrome 06/21/2012   ULNAR NEUROPATHY, LEFT 08/05/2009   Urinary obstruction 10/19/2008   ACTINIC  KERATOSIS 06/25/2008   DRY EYE SYNDROME 10/13/2007   CARCINOMA, BLADDER, HX OF 07/02/2007   BARRETTS ESOPHAGUS 05/23/2007   Hypothyroidism 04/08/2007   Other malaise and fatigue 04/08/2007   Essential hypertension 10/12/2006   ANEMIA, B12 DEFICIENCY 09/08/2006   Depression 07/29/2006   NEUROPATHY, IDIOPATHIC PERIPHERAL NEC 07/29/2006   Allergic rhinitis 07/29/2006   LOW BACK PAIN 07/29/2006   COLONIC POLYPS 11/04/2000   Past Medical History:  Diagnosis Date   Allergy     Anxiety    B12 deficiency anemia    Blood transfusion without reported diagnosis    CAD (coronary artery disease)    Cancer (HCC)    bladder, Right renal cell also   Chronic kidney disease    Colon polyps    COPD (chronic obstructive pulmonary disease) (HCC)    Depression    Diabetes mellitus (HCC)    Dyspnea    Esophagus, Barrett's    GERD (gastroesophageal reflux disease)    Headache    History of bladder cancer    Bladder cancer "8 times"   History of hiatal hernia    Hyperlipidemia    Hypertension    Localized osteoarthrosis, lower leg    Myocardial infarction (HCC) 2021   Pneumonia    Restless leg syndrome    Sleep apnea    does not wear cpap   Stenosis of esophagus     Family History  Problem Relation Age of Onset   Melanoma Mother    Stroke Father    Hypertension Father    Coronary artery disease Other    Colon cancer Neg Hx    Esophageal cancer Neg Hx    Stomach cancer Neg Hx    Rectal cancer Neg Hx    Pancreatic cancer Neg Hx    Liver disease Neg Hx    Inflammatory bowel disease Neg Hx     Past Surgical History:  Procedure Laterality Date   BILIARY BRUSHING  04/01/2018   Procedure: BILIARY BRUSHING;  Surgeon: Normie Becton., MD;  Location: [facility] ENDOSCOPY;  Service: Gastroenterology;;   BILIARY BRUSHING  09/12/2018   Procedure: BILIARY BRUSHING;  Surgeon: Normie Becton., MD;  Location: [facility] ENDOSCOPY;  Service: Gastroenterology;;   BILIARY BRUSHING  11/28/2018    Procedure: BILIARY BRUSHING;  Surgeon: Normie Becton., MD;  Location: [facility] ENDOSCOPY;  Service: Gastroenterology;;   BILIARY BRUSHING  03/11/2020   Procedure: BILIARY BRUSHING;  Surgeon: Normie Becton., MD;  Location: [facility]OSCOPY;  Service: Gastroenterology;;   BILIARY DILATION  09/12/2018   Procedure: BILIARY DILATION;  Surgeon: Normie Becton., MD;  Location: [facility] ENDOSCOPY;  Service: Gastroenterology;;   BILIARY DILATION  11/28/2018   Procedure: BILIARY DILATION;  Surgeon: Normie Becton., MD;  Location: [facility] ENDOSCOPY;  Service: Gastroenterology;;   BILIARY DILATION  03/11/2020   Procedure: BILIARY DILATION;  Surgeon: Normie Becton., MD;  Location: [facility]OSCOPY;  Service: Gastroenterology;;   BILIARY STENT  PLACEMENT  04/01/2018   Procedure: BILIARY STENT PLACEMENT;  Surgeon: Brice Campi Albino Alu., MD;  Location: [facility] ENDOSCOPY;  Service: Gastroenterology;;   BILIARY STENT PLACEMENT  09/12/2018   Procedure: BILIARY STENT PLACEMENT;  Surgeon: Normie Becton., MD;  Location: [facility] ENDOSCOPY;  Service: Gastroenterology;;   BILIARY STENT PLACEMENT  11/28/2018   Procedure: BILIARY STENT PLACEMENT;  Surgeon: Normie Becton., MD;  Location: [facility] ENDOSCOPY;  Service: Gastroenterology;;   BIOPSY  04/01/2018   Procedure: BIOPSY;  Surgeon: Normie Becton., MD;  Location: [facility] ENDOSCOPY;  Service: Gastroenterology;;   BIOPSY  09/12/2018   Procedure: BIOPSY;  Surgeon: Normie Becton., MD;  Location: [facility] ENDOSCOPY;  Service: Gastroenterology;;   BIOPSY  11/28/2018   Procedure: BIOPSY;  Surgeon: Normie Becton., MD;  Location: [facility] ENDOSCOPY;  Service: Gastroenterology;;   BIOPSY  03/11/2020   Procedure: BIOPSY;  Surgeon: Normie Becton., MD;  Location: [facility] ENDOSCOPY;  Service: Gastroenterology;;   bladder cancer      x 8 cystoscopy   CERVICAL DISCECTOMY     ACDF   CHOLECYSTECTOMY     COLONOSCOPY  11/17/2005   normal     CORONARY ARTERY BYPASS GRAFT     x4   CORONARY STENT INTERVENTION N/A 01/05/2019   Procedure: CORONARY STENT INTERVENTION;  Surgeon: Sammy Crisp, MD;  Location: [facility] INVASIVE CV LAB;  Service: Cardiovascular;  Laterality: N/A;   CYSTOSCOPY WITH BIOPSY N/A 03/27/2022   Procedure: CYSTOSCOPY WITH BIOPSY;  Surgeon: Homero Luster, MD;  Location: [facility] ORS;  Service: Urology;  Laterality: N/A;   CYSTOSCOPY WITH RETROGRADE PYELOGRAM, URETEROSCOPY AND STENT PLACEMENT Right 03/27/2022   Procedure: CYSTOSCOPY WITH RIGHT RETROGRADE PYELOGRAM, URETEROSCOPY AND STENT PLACEMENT urethral dilation;  Surgeon: Homero Luster, MD;  Location: [facility] ORS;  Service: Urology;  Laterality: Right;   ENDOSCOPIC MUCOSAL RESECTION  09/12/2018   Procedure: ENDOSCOPIC MUCOSAL RESECTION;  Surgeon: Brice Campi Albino Alu., MD;  Location: [facility] ENDOSCOPY;  Service: Gastroenterology;;   ENDOSCOPIC RETROGRADE CHOLANGIOPANCREATOGRAPHY (ERCP) WITH PROPOFOL  N/A 04/01/2018   Procedure: ENDOSCOPIC RETROGRADE CHOLANGIOPANCREATOGRAPHY (ERCP) WITH PROPOFOL ;  Surgeon: Normie Becton., MD;  Location: [facility] ENDOSCOPY;  Service: Gastroenterology;  Laterality: N/A;   ENDOSCOPIC RETROGRADE CHOLANGIOPANCREATOGRAPHY (ERCP) WITH PROPOFOL  N/A 09/12/2018   Procedure: ENDOSCOPIC RETROGRADE CHOLANGIOPANCREATOGRAPHY (ERCP) WITH PROPOFOL ;  Surgeon: Brice Campi Albino Alu., MD;  Location: [facility] ENDOSCOPY;  Service: Gastroenterology;  Laterality: N/A;   ENDOSCOPIC RETROGRADE CHOLANGIOPANCREATOGRAPHY (ERCP) WITH PROPOFOL  N/A 03/11/2020   Procedure: ENDOSCOPIC RETROGRADE CHOLANGIOPANCREATOGRAPHY (ERCP) WITH PROPOFOL ;  Surgeon: Brice Campi Albino Alu., MD;  Location: [facility] ENDOSCOPY;  Service: Gastroenterology;  Laterality: N/A;   ENDOSCOPIC RETROGRADE CHOLANGIOPANCREATOGRAPHY (ERCP) WITH PROPOFOL  N/A 06/03/2020   Procedure: ENDOSCOPIC RETROGRADE CHOLANGIOPANCREATOGRAPHY (ERCP) WITH PROPOFOL ;  Surgeon: Brice Campi Albino Alu., MD;  Location: [facility] ENDOSCOPY;  Service:  Gastroenterology;  Laterality: N/A;   ERCP N/A 11/28/2018   Procedure: ENDOSCOPIC RETROGRADE CHOLANGIOPANCREATOGRAPHY (ERCP) +EGD with spyglass;  Surgeon: Brice Campi Albino Alu., MD;  Location: [facility] ENDOSCOPY;  Service: Gastroenterology;  Laterality: N/A;   ESOPHAGOGASTRODUODENOSCOPY  04/29/2010   ESOPHAGOGASTRODUODENOSCOPY (EGD) WITH PROPOFOL  N/A 04/01/2018   Procedure: ESOPHAGOGASTRODUODENOSCOPY (EGD) WITH PROPOFOL ;  Surgeon: Brice Campi Albino Alu., MD;  Location: [facility] ENDOSCOPY;  Service: Gastroenterology;  Laterality: N/A;   ESOPHAGOGASTRODUODENOSCOPY (EGD) WITH PROPOFOL  N/A 09/12/2018   Procedure: ESOPHAGOGASTRODUODENOSCOPY (EGD) WITH PROPOFOL ;  Surgeon: Brice Campi Albino Alu., MD;  Location: [facility] ENDOSCOPY;  Service: Gastroenterology;  Laterality: N/A;   ESOPHAGOGASTRODUODENOSCOPY (EGD) WITH PROPOFOL  N/A 11/28/2018   Procedure: ESOPHAGOGASTRODUODENOSCOPY (EGD) WITH PROPOFOL ;  Surgeon: Brice Campi Albino Alu., MD;  Location: [facility] ENDOSCOPY;  Service:  Gastroenterology;  Laterality: N/A;   ESOPHAGOGASTRODUODENOSCOPY (EGD) WITH PROPOFOL  N/A 03/11/2020   Procedure: ESOPHAGOGASTRODUODENOSCOPY (EGD) WITH PROPOFOL ;  Surgeon: Brice Campi Albino Alu., MD;  Location: [facility] ENDOSCOPY;  Service: Gastroenterology;  Laterality: N/A;   ESOPHAGOGASTRODUODENOSCOPY (EGD) WITH PROPOFOL  N/A 06/03/2020   Procedure: ESOPHAGOGASTRODUODENOSCOPY (EGD) WITH PROPOFOL ;  Surgeon: Brice Campi Albino Alu., MD;  Location: [facility] ENDOSCOPY;  Service: Gastroenterology;  Laterality: N/A;   EUS  04/01/2018   Procedure: FULL UPPER ENDOSCOPIC ULTRASOUND (EUS) RADIAL;  Surgeon: Normie Becton., MD;  Location: [facility] ENDOSCOPY;  Service: Gastroenterology;;   EUS N/A 09/12/2018   Procedure: UPPER ENDOSCOPIC ULTRASOUND (EUS) RADIAL;  Surgeon: Normie Becton., MD;  Location: [facility] ENDOSCOPY;  Service: Gastroenterology;  Laterality: N/A;   FINE NEEDLE ASPIRATION  09/12/2018   Procedure: FINE NEEDLE ASPIRATION (FNA) LINEAR;  Surgeon:  Normie Becton., MD;  Location: [facility] ENDOSCOPY;  Service: Gastroenterology;;   HAND SURGERY Left 2018   saw accident   HEMOSTASIS CLIP PLACEMENT  09/12/2018   Procedure: HEMOSTASIS CLIP PLACEMENT;  Surgeon: Normie Becton., MD;  Location: [facility] ENDOSCOPY;  Service: Gastroenterology;;   HEMOSTASIS CLIP PLACEMENT  06/03/2020   Procedure: HEMOSTASIS CLIP PLACEMENT;  Surgeon: Normie Becton., MD;  Location: [facility]OSCOPY;  Service: Gastroenterology;;   I & D EXTREMITY Left 11/24/2016   Procedure: IRRIGATION AND DEBRIDEMENT LEFT HAND, THUMB, INDEX, MIDDLE, RING, AND SMALL FINGERS WITH RECONSTRUCTION;  Surgeon: Ronn Cohn, MD;  Location: [facility] OR;  Service: Orthopedics;  Laterality: Left;   KNEE ARTHROSCOPY Left    LEFT HEART CATH AND CORS/GRAFTS ANGIOGRAPHY N/A 01/05/2019   Procedure: LEFT HEART CATH AND CORS/GRAFTS ANGIOGRAPHY;  Surgeon: Sammy Crisp, MD;  Location: [facility] INVASIVE CV LAB;  Service: Cardiovascular;  Laterality: N/A;   LUMBAR LAMINECTOMY     and fusion x 2   NASAL SINUS SURGERY     POPLITEAL SYNOVIAL CYST EXCISION     REMOVAL OF STONES  04/01/2018   Procedure: REMOVAL OF STONES;  Surgeon: Brice Campi Albino Alu., MD;  Location: [facility] ENDOSCOPY;  Service: Gastroenterology;;   REMOVAL OF STONES  09/12/2018   Procedure: REMOVAL OF STONES;  Surgeon: Normie Becton., MD;  Location: [facility] ENDOSCOPY;  Service: Gastroenterology;;   REMOVAL OF STONES  11/28/2018   Procedure: REMOVAL OF STONES;  Surgeon: Normie Becton., MD;  Location: [facility] ENDOSCOPY;  Service: Gastroenterology;;   REMOVAL OF STONES  03/11/2020   Procedure: REMOVAL OF STONES;  Surgeon: Normie Becton., MD;  Location: [facility]OSCOPY;  Service: Gastroenterology;;   REMOVAL OF STONES  06/03/2020   Procedure: REMOVAL OF STONES;  Surgeon: Normie Becton., MD;  Location: [facility]OSCOPY;  Service: Gastroenterology;;   ROBOT ASSITED LAPAROSCOPIC NEPHROURETERECTOMY Right 06/03/2022    Procedure: XI ROBOT ASSITED LAPAROSCOPIC NEPHROURETERECTOMY, FLEXIBLE CYSTOSCOPY;  Surgeon: Melody Spurling., MD;  Location: [facility] ORS;  Service: Urology;  Laterality: Right;  3 HRS   SAVORY DILATION N/A 09/12/2018   Procedure: SAVORY DILATION;  Surgeon: Brice Campi Albino Alu., MD;  Location: [facility] ENDOSCOPY;  Service: Gastroenterology;  Laterality: N/A;   SAVORY DILATION N/A 11/28/2018   Procedure: SAVORY DILATION;  Surgeon: Brice Campi Albino Alu., MD;  Location: [facility] ENDOSCOPY;  Service: Gastroenterology;  Laterality: N/A;   SEPTOPLASTY Bilateral 05/26/2021   Procedure: SEPTOPLASTY;  Surgeon: Reynold Caves, MD;  Location: [facility];  Service: ENT;  Laterality: Bilateral;   SPHINCTEROTOMY  04/01/2018   Procedure: [address];  Surgeon: Mansouraty, Albino Alu., MD;  Location: [facility] ENDOSCOPY;  Service: Gastroenterology;;   Alberteen Aloe CHOLANGIOSCOPY N/A 11/28/2018  Procedure: SPYGLASS CHOLANGIOSCOPY;  Surgeon: Normie Becton., MD;  Location: [facility] ENDOSCOPY;  Service: Gastroenterology;  Laterality: N/A;   SPYGLASS CHOLANGIOSCOPY N/A 06/03/2020   Procedure: SPYGLASS CHOLANGIOSCOPY;  Surgeon: Normie Becton., MD;  Location: [facility] ENDOSCOPY;  Service: Gastroenterology;  Laterality: N/A;   STENT REMOVAL  09/12/2018   Procedure: STENT REMOVAL;  Surgeon: Normie Becton., MD;  Location: [facility] ENDOSCOPY;  Service: Gastroenterology;;   [place] Herald REMOVAL  11/28/2018   Procedure: STENT REMOVAL;  Surgeon: Normie Becton., MD;  Location: [facility] ENDOSCOPY;  Service: Gastroenterology;;   Y[place]onne Herald REMOVAL  03/11/2020   Procedure: STENT REMOVAL;  Surgeon: Normie Becton., MD;  Location: [facility];  Service: Gastroenterology;;   SUBMUCOSAL LIFTING INJECTION  09/12/2018   Procedure: SUBMUCOSAL LIFTING INJECTION;  Surgeon: Normie Becton., MD;  Location: [facility] ENDOSCOPY;  Service: Gastroenterology;;   Social History   Occupational History   Not on file  Tobacco Use    Smoking status: Former    Current packs/day: 0.00    Average packs/day: 0.5 packs/day for 5.0 years (2.5 ttl pk-yrs)    Types: Cigarettes    Start date: 03/31/1971    Quit date: 03/30/1976    Years since quitting: 47.2   Smokeless tobacco: Never  Vaping Use   Vaping status: Never Used  Substance and Sexual Activity   Alcohol use: No    Alcohol/week: 0.0 standard drinks of alcohol   Drug use: No   Sexual activity: Yes

## 2023-06-25 ENCOUNTER — Other Ambulatory Visit

## 2023-06-25 ENCOUNTER — Encounter: Payer: Self-pay | Admitting: Physician Assistant

## 2023-06-25 ENCOUNTER — Ambulatory Visit: Admitting: Physician Assistant

## 2023-06-25 VITALS — BP 122/62 | HR 59 | Ht 69.0 in | Wt 160.0 lb

## 2023-06-25 DIAGNOSIS — K6289 Other specified diseases of anus and rectum: Secondary | ICD-10-CM

## 2023-06-25 DIAGNOSIS — R1013 Epigastric pain: Secondary | ICD-10-CM

## 2023-06-25 DIAGNOSIS — K219 Gastro-esophageal reflux disease without esophagitis: Secondary | ICD-10-CM | POA: Diagnosis not present

## 2023-06-25 DIAGNOSIS — R1319 Other dysphagia: Secondary | ICD-10-CM

## 2023-06-25 DIAGNOSIS — Z8719 Personal history of other diseases of the digestive system: Secondary | ICD-10-CM | POA: Diagnosis not present

## 2023-06-25 DIAGNOSIS — Z860101 Personal history of adenomatous and serrated colon polyps: Secondary | ICD-10-CM

## 2023-06-25 DIAGNOSIS — R0609 Other forms of dyspnea: Secondary | ICD-10-CM

## 2023-06-25 DIAGNOSIS — R1310 Dysphagia, unspecified: Secondary | ICD-10-CM | POA: Diagnosis not present

## 2023-06-25 DIAGNOSIS — I25709 Atherosclerosis of coronary artery bypass graft(s), unspecified, with unspecified angina pectoris: Secondary | ICD-10-CM

## 2023-06-25 LAB — COMPREHENSIVE METABOLIC PANEL WITH GFR
ALT: 15 U/L (ref 0–53)
AST: 27 U/L (ref 0–37)
Albumin: 4.1 g/dL (ref 3.5–5.2)
Alkaline Phosphatase: 65 U/L (ref 39–117)
BUN: 53 mg/dL — ABNORMAL HIGH (ref 6–23)
CO2: 30 meq/L (ref 19–32)
Calcium: 8.6 mg/dL (ref 8.4–10.5)
Chloride: 107 meq/L (ref 96–112)
Creatinine, Ser: 2.78 mg/dL — ABNORMAL HIGH (ref 0.40–1.50)
GFR: 20.75 mL/min — ABNORMAL LOW (ref >60.00)
Glucose, Bld: 114 mg/dL — ABNORMAL HIGH (ref 70–99)
Potassium: 5.8 meq/L — ABNORMAL HIGH (ref 3.5–5.1)
Sodium: 141 meq/L (ref 135–145)
Total Bilirubin: 0.4 mg/dL (ref 0.2–1.2)
Total Protein: 6.8 g/dL (ref 6.0–8.3)

## 2023-06-25 LAB — CBC WITH DIFFERENTIAL/PLATELET
Basophils Absolute: 0.1 10*3/uL (ref 0.0–0.1)
Basophils Relative: 0.7 % (ref 0.0–3.0)
Eosinophils Absolute: 0.1 10*3/uL (ref 0.0–0.7)
Eosinophils Relative: 1.3 % (ref 0.0–5.0)
HCT: 36 % — ABNORMAL LOW (ref 39.0–52.0)
Hemoglobin: 12.1 g/dL — ABNORMAL LOW (ref 13.0–17.0)
Lymphocytes Relative: 13.2 % (ref 12.0–46.0)
Lymphs Abs: 1.3 10*3/uL (ref 0.7–4.0)
MCHC: 33.5 g/dL (ref 30.0–36.0)
MCV: 85.9 fl (ref 78.0–100.0)
Monocytes Absolute: 1 10*3/uL (ref 0.1–1.0)
Monocytes Relative: 9.8 % (ref 3.0–12.0)
Neutro Abs: 7.4 10*3/uL (ref 1.4–7.7)
Neutrophils Relative %: 75 % (ref 43.0–77.0)
Platelets: 171 10*3/uL (ref 150.0–400.0)
RBC: 4.19 Mil/uL — ABNORMAL LOW (ref 4.22–5.81)
RDW: 14.4 % (ref 11.5–15.5)
WBC: 9.9 10*3/uL (ref 4.0–10.5)

## 2023-06-25 LAB — SEDIMENTATION RATE: Sed Rate: 14 mm/h (ref 0–20)

## 2023-06-25 LAB — LIPASE: Lipase: 44 U/L (ref 11.0–59.0)

## 2023-06-25 MED ORDER — METRONIDAZOLE 250 MG PO TABS: 250.0000 mg | ORAL_TABLET | Freq: Three times a day (TID) | ORAL | 0 refills | Status: AC

## 2023-06-25 NOTE — Progress Notes (Addendum)
 Refill 1 if you want oh [facility]    06/25/2023 [name] [id number] 1940-12-28  Referring provider: Zilphia Hilt, [place]* Primary GI doctor: Dr. Brice Campi  ASSESSMENT AND PLAN:     Dysphagia at end of esophagus, with history of GERD, post prandial epigastric pain, rule out CMI 03/2022 with esophageal dilation which really did not help the patient has never had barium swallow.   12/2022 CT AB and pelvis without contrast no acute process - get barium swallow - consider GES with description, given gastroparesis diet - consider CTA for chronic mesenteric ischemia but patient has CKD stage 4, will get mesenteric dopplers -Continue diet modifications -Continue pantoprazole   Epigastric pain worse post prandial, with increased noises S/p cholecystectomy  History of ERCP and pancreatitis 2020  CT abdomen pelvis for abdominal discomfort unremarkable 03/2022 Did trial of flagyl  with some help, no help with doxy Negative pancreatic insuff Pending CT AB with Dr. Secundino Dach on Tuesday but without contrast due to kidney function -Will retreat with flagy -Check CBC, LFTs, lipase -Consider further imaging based off labs -Consider GES  Rectal Pain  Suspect proctalgia fugax, pelvic floor dysfunction, back Will get sed rate -continue diltiazem as needed.  -Patient has recall for colonoscopy/flex sig, declines at this time - consider referral to pelvic floor PT for evaluation  CAD s/p CABG 2001 Echo 2012 EF 40-45% Following with Dr. Loetta Ringer in cardiology, last visit 11/2022 Myoview  stress test low risk 05/17/2022 Followed Dr. Marygrace Snellen and started on inhaler and feels this has improved  Right RCC S/p right renal nephrectomy Follows with Dr. Secundino Dach  Patient Care Team: Zilphia Hilt, Charyl Coppersmith, MD as PCP - General (Internal Medicine) Millicent Ally, MD as PCP - Cardiology (Cardiology) Mansouraty, Albino Alu., MD as Consulting Physician (Gastroenterology)  HISTORY OF PRESENT  ILLNESS: 83 y.o. male with a past medical history of  depression, hypertension, coronary artery disease s/p 2 vessel CABG 2001, s/p DES 12/2018, COPD, sleep apnea does not use CPAP, diabetes mellitus type II, restless leg syndrome, bladder cancer, GERD, Barrett's esophagus, choledocholithiasis with a distal  biliary stricture s/p multiple ERCPs with stone extraction and 3 biliary stent placement with subsequent bilary stent removal 02/2020, prior pancreatitis secondary to gallstone disease and history of elevated Ig4 level. S/P cholecystectomy 12/2018 and others listed below presents for post prandial AB pain  12/29/2017 colonoscopy 3 mm polyp descending colon, diverticulosis, TA polyp, no dysplasia 09/12/2018 EGD which showed Barrett's esophagus without dysplasia, gastritis and a hyperplastic gastric polyp.  No evidence of H. pylori.  The esophagus was dilated.  04/10/2022 EGD and flex sigmoidoscopy  Flex sig had poor bowel prep, - Preparation of the colon was poor. - Hemorrhoids found on digital rectal exam. - Two 6 to 8 mm polyps in the transverse colon, removed with a cold snare. Resected and retrieved. - Stool in the descending colon, at the splenic flexure and in the transverse colon. - Normal mucosa in the rectum, in the recto- sigmoid colon, in the sigmoid colon and in the descending colon. Biopsied. - Non- bleeding non- thrombosed external and internal hemorrhoids.Recommended 1 year follow up   Discussed the use of AI scribe software for clinical note transcription with the patient, who gave verbal consent to proceed.  History of Present Illness   [name] "[name]" is an 83 year old male with a history of esophageal issues and bacterial overgrowth who presents with abdominal pain and swallowing difficulties.  He experiences severe abdominal pain, particularly in the lower abdomen,  described as a 'rolling kind of pain' that occurs after eating. The pain is not constant but can be severe  and is accompanied by a sensation of something moving inside. He has a history of gastrointestinal issues and has been treated with metronidazole  and doxycycline . He experiences alternating constipation and loose stools, with urgency occurring shortly after eating. He typically has about three bowel movements per day, which is usual for him, and denies any blood in the stool.  He has difficulty swallowing, particularly with solids, and experiences a sensation of food getting stuck at the junction of the esophagus and stomach. This sometimes feels like it is 'tearing it open.' He has been on pantoprazole , which has helped reduce his reflux symptoms, allowing him to drink water  without discomfort. However, he still experiences discomfort and a feeling of sickness after eating, which he describes as feeling 'three, four times bad' and includes weakness and tiredness. He mentions a history of esophageal dilation, which did not alleviate his swallowing difficulties.  He reports persistent rectal pain, which is exacerbated by sitting or lying down but not associated with bowel movements. He has had two back surgeries and wonders if the pain might be related to his back issues.  He also reports a consistent headache and congestion that started about a year ago, along with episodes of shaking that occur randomly, not necessarily related to eating. No nausea and vomiting, except when food gets stuck. No blood in stool. Experiences weakness and tiredness after eating.       Wt Readings from Last 5 Encounters:  06/25/23 160 lb (72.6 kg)  05/19/23 167 lb 3.2 oz (75.8 kg)  05/12/23 163 lb 3.2 oz (74 kg)  03/16/23 165 lb (74.8 kg)  02/11/23 167 lb 3.2 oz (75.8 kg)     He  reports that he quit smoking about 47 years ago. His smoking use included cigarettes. He started smoking about 52 years ago. He has a 2.5 pack-year smoking history. He has never used smokeless tobacco. He reports that he does not drink alcohol  and does not use drugs.   MOST RECENT GI PROCEDURES: 01/01/2023 CT AB and pelvis WITHOUT due to kidney function IMPRESSION: 1. No acute intra-abdominal or pelvic pathology. 2. Sigmoid diverticulosis. No bowel obstruction. Normal appendix. 3. Status post prior right nephrectomy. 4.  Aortic Atherosclerosis (ICD10-I70.0).  EGD 04/10/2022  - Salmon- colored mucosal tongue suspicious for short- segment Barrett' s esophagus found distally. Biopsied. - Z- line irregular, 40 cm from the incisors. - 1 cm hiatal hernia. - Dilation performed in the esophagus with savory 16 mm and 18 mm with mucosal wrent noted at the GE junction. - Erythematous mucosa in the antrum. No other gross lesions in the entire stomach. Biopsied. - Duodenal angulation deformity in the duodenal sweep. No other gross mucosal lesions in the duodenal bulb, in the first portion of the duodenum and in the second portion of the duodenum. Biopsied. 1. Surgical [P], gastric antrum and gastric body - ANTRAL AND OXYNTIC MUCOSA WITH NO SIGNIFICANT PATHOLOGY. - NO HELICOBACTER PYLORI ORGANISMS IDENTIFIED ON H&E STAINED SLIDE. 2. Surgical [P], duodenal bulb, 2nd portion of duodenum, and distal duodenum - DUODENAL MUCOSA WITH PROMINENT BRUNNER'S GLANDS, OTHERWISE NO SIGNIFICANT PATHOLOGY. 3. Surgical [P], distal esophagus - SQUAMOCOLUMNAR JUNCTIONAL MUCOSA WITH MILD CHANGES CONSISTENT WITH REFLUX. - NEGATIVE FOR INTESTINAL METAPLASIA. 4. Surgical [P], random esophagus biopsies - SQUAMOUS MUCOSA WITH INCREASED INTRAEPITHELIAL LYMPHOCYTES Flexible sigmoidoscopy 04/10/2022 - Preparation of the colon was poor. - Hemorrhoids found  on digital rectal exam. - Two 6 to 8 mm polyps in the transverse colon, removed with a cold snare. Resected and retrieved. - Stool in the descending colon, at the splenic flexure and in the transverse colon. - Normal mucosa in the rectum, in the recto- sigmoid colon, in the sigmoid colon and in the descending colon.  Biopsied. - Non- bleeding non- thrombosed external and internal hemorrhoids.  TA polyp and hyperplastic polyp. PATH: 1. Surgical [P], gastric antrum and gastric body - ANTRAL AND OXYNTIC MUCOSA WITH NO SIGNIFICANT PATHOLOGY. - NO HELICOBACTER PYLORI ORGANISMS IDENTIFIED ON H&E STAINED SLIDE. 2. Surgical [P], duodenal bulb, 2nd portion of duodenum, and distal duodenum - DUODENAL MUCOSA WITH PROMINENT BRUNNER'S GLANDS, OTHERWISE NO SIGNIFICANT PATHOLOGY. 3. Surgical [P], distal esophagus - SQUAMOCOLUMNAR JUNCTIONAL MUCOSA WITH MILD CHANGES CONSISTENT WITH REFLUX. - NEGATIVE FOR INTESTINAL METAPLASIA. 4. Surgical [P], random esophagus biopsies - SQUAMOUS MUCOSA WITH INCREASED INTRAEPITHELIAL LYMPHOCYTES. NOTE: THE ABOVE FINDINGS ARE NONSPECIFIC AND HAVE BEEN ASSOCIATED WITH CANDIDIASIS (A PAS STAIN IS PENDING AND WILL BE REPORTED IN AN ADDENDUM), DYSMOTILITY DISORDERS/ACHALASIA, AS WELL AS CROHN'S DISEASE (TYPICALLY IN THE YOUNGER NATURE) AMONGST OTHERS. 5. Surgical [P], colon, transverse, sigmoid, polyp (3) - TUBULAR ADENOMA, FRAGMENTS. - HYPERPLASTIC POLYP. 6. Surgical [P], colon nos, random sites - COLONIC MUCOSA WITHIN NORMAL LIMITS. 7. Surgical [P], colon, rectum - COLONIC MUCOSA WITHIN NORMAL LIMITS.  ERCP 06/03/2020: - Prior biliary sphincterotomy appeared open. - The entire main bile duct was moderately dilated. - A filling defect was noted that was felt to be more likely air rather than stone on fluoroscopy. - The biliary tree was swept and very small amount of sludge was found. Stones were not removed. - The filling defect looked to have been removed completely, but the size of the previous stone on CT scan made me feel need to ensure it was gone. SpyGlass interrogation was performed. There was mild narrowing in the distal CBD, but the majority of the rest of the biliary tract had normal mucosa. - Incidental mucosal tear noted after Duodenoscope/SpyScope positioning loss in  the D1/D2 angle. Five hemostatic clips were successfully placed (MR conditional) to close this defect.   ERCP 03/11/2020: - No gross lesions in esophagus proximally. Salmon-colored mucosal islands suspicious for short-segment Barrett's esophagus - biopsied. - Erythematous mucosa in the antrum. No other gross lesions in the stomach. Biopsied. - No gross lesions in the duodenal bulb, in the first portion of the duodenum and in the second portion of the duodenum. - Prior biliary sphincterotomy appeared open. Three stents from the biliary tree were seen in the major papilla - these were removed and sent for cytology. - The fluoroscopic examination was suspicious for sludge as well as biliary dilation in the main duct. - A fine tapering was noted distally. Not clearly a stricture but could be narrowing. This  was dilated and brushed for cytology. - Choledocholithiasis was found. Complete removal was accomplished by sweeping. - At end of procedure, the biliary tree was swept and nothing was found. - Pull through of the 12 mm balloon through the entire duct was felt to be adequate so I did not replace the previous plastic stents for a trial of how the patient does without stenting in regards to LFT pattern and overall clinical scenario.   ERCP 11/28/2018: second portion of the duodenum. - Prior biliary sphincterotomy appeared open. - Two visibly patent stents from the biliary tree were seen in the major papilla. These were removed and sent for  cytology. - The fluoroscopic examination was suspicious for sludge/debris. - A single localized biliary stricture was found in the lower third of the main bile duct. The stricture was indeterminate. This was dilated. This was brushed for cytology. - Choledocholithiasis and biliary sludge was found. Complete removal was accomplished by sweeping on multiple occasions. Occlusion cholangiogram showed a clear duct at end of procedure. - Moderately narrowed lumen  in the biliary tract visualized via SpyGlass. This was biopsied. - Three plastic biliary stents were placed into the common bile duct to aid in dilation of the narrowing. - Overall, suspect this is more an inflammatory stricture/stenosis from stone disease. The consideration of an IgG4 mediated process remains. However, there is no doubt that the patient continues to drop stones/sludge from the gallbladder into the duct as evidenced on last 2 ERCPs of significant stone/debris being removed   EGD/EUS/ERCP 09/12/2018: - Prior biliary sphincterotomy appeared open. - Filling defects consistent with stones and sludge were seen on the cholangiogram. - A single moderate biliary stricture was found in the lower third of the main bile duct. The stricture was indeterminate. This stricture was dilated. This stricture was brushed for cytology. Two plastic biliary stents were placed into the common bile duct to traverse the stricture. - The entire main bile duct was severely dilated, secondary to aforementioned stricture. - Choledocholithiasis and clot/debris was found. Complete removal was accomplished by sweeping after sphincteroplasty. EGD Impression: - No gross lesions in proximal/middle esophagus. Salmon-colored mucosa suspicious for short-segment Barrett's esophagus. Biopsied. Dilation of esophagus performed. - Gastritis. Biopsied. - A single gastric polyp. Resected and retrieved via mucosal resection. Clips (MR conditional) were placed to close the defect. - No gross lesions in the duodenal bulb, in the first portion of the duodenum and in the second portion of the duodenum. Biopsied for enteropathy/celiac rule out. - Plastic biliary stent in the duodenum. Removed. - Patent sphincterotomy was found. EUS Impression: - There was a suggestion of a stricture in the lower third of the main bile duct. Fine needle biopsy performed of the region (although no overt mass was noted significant  thickening present). - Multiple stones were visualized endosonographically in the common bile duct. - There was dilation in the common bile duct and in the common hepatic duct which measured up to 17 mm. - The pancreatic duct had a normal endosonographic appearance in the pancreatic head, genu of the pancreas, body of the pancreas and tail of the pancreas. - Pancreatic parenchymal abnormalities consisting of diffuse echogenicity and lobularity were noted in the entire pancreas. - No malignant-appearing lymph nodes were visualized in the celiac region (level 20), peripancreatic region and porta hepatis region. 1. Duodenum, Biopsy - BENIGN SMALL BOWEL MUCOSA. - NO ACTIVE INFLAMMATION OR VILLOUS ATROPHY IDENTIFIED. 2. Stomach, biopsy - CHRONIC INACTIVE GASTRITIS. - THERE IS NO EVIDENCE OF HELICOBACTER PYLORI, DYSPLASIA OR MALIGNANCY. - SEE COMMENT. 3. Esophagus, biopsy - INTESTINAL METAPLASIA (GOBLET CELL METAPLASIA) CONSISTENT WITH BARRETT'S ESOPHAGUS. - THERE IS NO EVIDENCE OF DYSPLASIA OR MALIGNANCY. 4. Esophagus, biopsy, random - BENIGN SQUAMOUS MUCOSA. - THERE IS NO EVIDENCE OF SIGNIFICANT INFLAMMATION, DYSPLASIA, OR MALIGNANCY. 5. Stomach, biopsy, pre-pyloric - ULCERATED HYPERPLASTIC POLYP(S). - THERE IS NO EVIDENCE OF MALIGNANCY   EUS/ERCP 04/01/2018:   EGD Impression: - White nummular lesions on esophageal mucosa. Biopsied for Candida rule out. - Gastric mucosal atrophy at fundus. Biopsied. - Non-bleeding erosive gastropathy. Biopsied for HP. - Congested, erythematous, friable (with contact bleeding), granular, inflamed and texture changed mucosa in the pylorus.  Biopsied to rule out adenoma or other processes.. - No gross lesions in the duodenal bulb, in the first portion of the duodenum and in the second portion of the duodenum. - Ampulla was flat. No bile was able to be suctioned out. EUS Impression: - Pancreatic parenchymal abnormalities consisting of diffuse  echogenicity were noted in the genu of the pancreas, pancreatic body and pancreatic tail. The head of the pancreas due to the heterogeneity was not grossly abnormal however, the chance of missing subtle lesions with decreased sensitivity in setting of acute pancreatitis is possible. - The pancreatic duct had a normal endosonographic appearance in the genu of the pancreas, body of the pancreas and tail of the pancreas. - There was dilation in the common bile duct and in the common hepatic duct. - There was a suggestion of a stricture in the lower third of the main bile duct. - Hyperechoic material consistent with sludge was visualized endosonographically in the common bile duct above the likely stricture. - Hyperechoic material consistent with sludge was visualized endosonographically in the gallbladder - The major papilla appeared to be flat. - The fluoroscopic examination was suspicious for sludge. - A single moderate biliary stricture was found in the lower third of the main bile duct. The stricture was indeterminate. This was brushed for cytology. - A biliary sphincterotomy was performed. - The biliary tree was swept and sludge and debris were found. - One plastic biliary stent was placed into the common bile duct and allowed good drainage at the end of the procedure of the biliary tree suggestive that stenosis was significant.   Colonoscopy 12/29/2017: - One 3 mm polyp in the descending colon, removed with a cold snare. Resected and retrieved. - Diverticulosis in the sigmoid colon and in the ascending colon. - The examination was otherwise normal. No cause for pain noted on this exam. 1. Surgical [P], gastric cardia nodule - HYPERPLASTIC POLYP(S). - THERE IS NO EVIDENCE OF MALIGNANCY. 2. Surgical [P], GE junction - GASTROESOPHAGEAL JUNCTION MUCOSA WITH MILD INFLAMMATION CONSISTENT WITH GASTROESOPHAGEAL REFLUX. - THERE IS NO EVIDENCE OF GOBLET CELL METAPLASIA, DYSPLASIA, OR  MALIGNANCY. 3. Surgical [P], descending colon, polyp - TUBULAR ADENOMA(S). - HIGH GRADE DYSPLASIA IS NOT IDENTIFIED  RELEVANT LABS AND IMAGING:  CBC    Component Value Date/Time   WBC 7.5 12/28/2022 1214   RBC 4.25 12/28/2022 1214   HGB 12.3 (L) 12/28/2022 1214   HGB 13.7 12/09/2020 0853   HGB 13.0 04/25/2007 1127   HCT 36.9 (L) 12/28/2022 1214   HCT 40.5 12/09/2020 0853   HCT 37.0 (L) 04/25/2007 1127   PLT 190.0 12/28/2022 1214   PLT 161 12/09/2020 0853   MCV 86.8 12/28/2022 1214   MCV 87 12/09/2020 0853   MCV 89.0 04/25/2007 1127   MCH 28.3 05/22/2022 0934   MCHC 33.2 12/28/2022 1214   RDW 14.2 12/28/2022 1214   RDW 13.0 12/09/2020 0853   RDW 13.8 04/25/2007 1127   LYMPHSABS 2.3 12/28/2022 1214   LYMPHSABS 2.5 04/25/2007 1127   MONOABS 0.7 12/28/2022 1214   MONOABS 0.6 04/25/2007 1127   EOSABS 0.4 12/28/2022 1214   EOSABS 0.0 04/25/2007 1127   BASOSABS 0.1 12/28/2022 1214   BASOSABS 0.0 04/25/2007 1127   Recent Labs    12/28/22 1214  HGB 12.3*    CMP     Component Value Date/Time   NA 143 12/28/2022 1214   NA 144 12/09/2020 0853   K 5.3 No hemolysis seen (H) 12/28/2022 1214  CL 107 12/28/2022 1214   CO2 27 12/28/2022 1214   GLUCOSE 99 12/28/2022 1214   BUN 35 (H) 12/28/2022 1214   BUN 21 12/09/2020 0853   CREATININE 2.60 (H) 12/28/2022 1214   CREATININE 1.61 (H) 11/09/2019 0939   CALCIUM  8.8 12/28/2022 1214   PROT 7.0 12/28/2022 1214   PROT 7.2 12/09/2020 0853   ALBUMIN  4.0 12/28/2022 1214   ALBUMIN  4.2 12/09/2020 0853   AST 16 12/28/2022 1214   ALT 7 12/28/2022 1214   ALKPHOS 66 12/28/2022 1214   BILITOT 0.3 12/28/2022 1214   BILITOT 0.4 12/09/2020 0853   GFRNONAA 30 (L) 06/04/2022 0503   GFRAA 47 (L) 11/13/2019 1006      Latest Ref Rng & Units 12/28/2022   12:14 PM 06/02/2022   10:00 AM 05/22/2022    9:34 AM  Hepatic Function  Total Protein 6.0 - 8.3 g/dL 7.0  6.8  7.3   Albumin  3.5 - 5.2 g/dL 4.0  3.9  3.8   AST 0 - 37 U/L 16  16  18     ALT 0 - 53 U/L 7  7  11    Alk Phosphatase 39 - 117 U/L 66  58  56   Total Bilirubin 0.2 - 1.2 mg/dL 0.3  0.5  0.6       Current Medications:    Current Outpatient Medications (Cardiovascular):    amLODipine  (NORVASC ) 5 MG tablet, TAKE 1 TABLET BY MOUTH DAILY   ezetimibe  (ZETIA ) 10 MG tablet, TAKE 1 TABLET BY MOUTH DAILY   isosorbide  mononitrate (IMDUR ) 60 MG 24 hr tablet, TAKE 1 TABLET BY MOUTH DAILY   nebivolol  (BYSTOLIC ) 10 MG tablet, TAKE 1 TABLET BY MOUTH DAILY   nitroGLYCERIN  (NITROSTAT ) 0.4 MG SL tablet, PLACE 1 TABLET UNDER THE TONGUE EVERY 5 MINUTES AS NEEDED FOR CHEST PAIN   rosuvastatin  (CRESTOR ) 10 MG tablet, TAKE 1 TABLET BY MOUTH DAILY  Current Outpatient Medications (Respiratory):    cetirizine (ZYRTEC) 10 MG tablet, Take 10 mg by mouth daily as needed for allergies.   fluticasone  (FLONASE ) 50 MCG/ACT nasal spray, Place 1 spray into both nostrils daily as needed for allergies.   Fluticasone -Umeclidin-Vilant (TRELEGY ELLIPTA ) 200-62.5-25 MCG/ACT AEPB, Inhale 1 puff into the lungs daily.   Fluticasone -Umeclidin-Vilant (TRELEGY ELLIPTA ) 200-62.5-25 MCG/ACT AEPB, Inhale 1 puff into the lungs daily.  Current Outpatient Medications (Analgesics):    acetaminophen  (TYLENOL ) 500 MG tablet, Take 1,000 mg by mouth every 6 (six) hours as needed for moderate pain.   aspirin  EC 81 MG tablet, Take 81 mg by mouth every evening. Swallow whole.   oxyCODONE -acetaminophen  (PERCOCET) 10-325 MG tablet, Take 1 tablet by mouth 2 (two) times daily.   oxyCODONE -acetaminophen  (PERCOCET) 10-325 MG tablet, Take 1 tablet by mouth 2 (two) times daily.   oxyCODONE -acetaminophen  (PERCOCET) 10-325 MG tablet, Take 1 tablet by mouth 2 (two) times daily.  Current Outpatient Medications (Hematological):    clopidogrel  (PLAVIX ) 75 MG tablet, TAKE 1 TABLET BY MOUTH ONCE  DAILY   cyanocobalamin  (VITAMIN B12) 1000 MCG/ML injection, INJECT 1 ML SUBCUTANEOUSLY ONCE EVERY MONTH   Fe Bisgly-Vit C-Vit B12-FA  (GENTLE IRON PO), Take by mouth. daily  Current Outpatient Medications (Other):    Coenzyme Q10 200 MG capsule, Take 200 mg by mouth daily.   doxycycline  (VIBRA -TABS) 100 MG tablet, Take 1 tablet (100 mg total) by mouth 2 (two) times daily.   Menthol, Topical Analgesic, (BENGAY EX), Apply 1 application topically daily as needed (pain).   METAMUCIL FIBER PO, Take 1  capsule by mouth in the morning.   metroNIDAZOLE  (FLAGYL ) 250 MG tablet, Take 1 tablet (250 mg total) by mouth 3 (three) times daily for 10 days.   Multiple Vitamin (MULTIVITAMIN) tablet, Take 1 tablet by mouth daily.   Naphazoline-Pheniramine (OPCON-A) 0.027-0.315 % SOLN, Place 1 drop into both eyes daily as needed (itching eyes).   NEEDLE, DISP, 25 G (B-D DISP NEEDLE 25GX1") 25G X 1" MISC, Inject 1000 mcg into muscle once a month.   NON FORMULARY, Diltiazem 2%/Lidocaine5% compound Use 3 x rectally daily for 2 months to heal anal fissure   pantoprazole  (PROTONIX ) 40 MG tablet, TAKE 1 TABLET BY MOUTH ONCE  DAILY   polyethylene glycol powder (GLYCOLAX/MIRALAX) 17 GM/SCOOP powder, Take 1 Container by mouth daily. As needed   prednisoLONE acetate (PRED FORTE) 1 % ophthalmic suspension, Place 1 drop into both eyes in the morning, at noon, and at bedtime.   rOPINIRole  (REQUIP ) 2 MG tablet, TAKE 1 TABLET BY MOUTH AT  BEDTIME   saccharomyces boulardii (FLORASTOR) 250 MG capsule, Take 250 mg by mouth 2 (two) times daily.   venlafaxine  (EFFEXOR ) 75 MG tablet, TAKE 1 TABLET BY MOUTH DAILY  Medical History:  Past Medical History:  Diagnosis Date   Allergy     Anxiety    B12 deficiency anemia    Blood transfusion without reported diagnosis    CAD (coronary artery disease)    Cancer (HCC)    bladder, Right renal cell also   Chronic kidney disease    Colon polyps    COPD (chronic obstructive pulmonary disease) (HCC)    Depression    Diabetes mellitus (HCC)    Dyspnea    Esophagus, Barrett's    GERD (gastroesophageal reflux disease)     Headache    History of bladder cancer    Bladder cancer "8 times"   History of hiatal hernia    Hyperlipidemia    Hypertension    Localized osteoarthrosis, lower leg    Myocardial infarction (HCC) 2021   Pneumonia    Restless leg syndrome    Sleep apnea    does not wear cpap   Stenosis of esophagus    Allergies: No Known Allergies   Surgical History:  He  has a past surgical history that includes Coronary artery bypass graft; Cervical discectomy; Lumbar laminectomy; bladder cancer; Popliteal synovial cyst excision; Knee arthroscopy (Left); Esophagogastroduodenoscopy (04/29/2010); Colonoscopy (11/17/2005); I & D extremity (Left, 11/24/2016); Nasal sinus surgery; Endoscopic retrograde cholangiopancreatography (ercp) with propofol  (N/A, 04/01/2018); biopsy (04/01/2018); sphincterotomy (04/01/2018); Biliary brushing (04/01/2018); biliary stent placement (04/01/2018); Esophagogastroduodenoscopy (egd) with propofol  (N/A, 04/01/2018); EUS (04/01/2018); removal of stones (04/01/2018); Esophagogastroduodenoscopy (egd) with propofol  (N/A, 09/12/2018); EUS (N/A, 09/12/2018); Endoscopic retrograde cholangiopancreatography (ercp) with propofol  (N/A, 09/12/2018); Stent removal (09/12/2018); biopsy (09/12/2018); Fine needle aspiration (09/12/2018); Biliary dilation (09/12/2018); biliary stent placement (09/12/2018); Biliary brushing (09/12/2018); removal of stones (09/12/2018); Endoscopic mucosal resection (09/12/2018); Submucosal lifting injection (09/12/2018); Hemostasis clip placement (09/12/2018); Savory dilation (N/A, 09/12/2018); ERCP (N/A, 11/28/2018); Spyglass cholangioscopy (N/A, 11/28/2018); Savory dilation (N/A, 11/28/2018); Esophagogastroduodenoscopy (egd) with propofol  (N/A, 11/28/2018); Stent removal (11/28/2018); Biliary dilation (11/28/2018); biopsy (11/28/2018); Biliary brushing (11/28/2018); removal of stones (11/28/2018); biliary stent placement (11/28/2018); LEFT HEART CATH AND CORS/GRAFTS  ANGIOGRAPHY (N/A, 01/05/2019); CORONARY STENT INTERVENTION (N/A, 01/05/2019); Endoscopic retrograde cholangiopancreatography (ercp) with propofol  (N/A, 03/11/2020); Esophagogastroduodenoscopy (egd) with propofol  (N/A, 03/11/2020); biopsy (03/11/2020); Stent removal (03/11/2020); removal of stones (03/11/2020); Biliary dilation (03/11/2020); Biliary brushing (03/11/2020); Hand surgery (Left, 2018); Endoscopic retrograde cholangiopancreatography (ercp) with propofol  (N/A, 06/03/2020); Hemostasis clip  placement (06/03/2020); Spyglass cholangioscopy (N/A, 06/03/2020); removal of stones (06/03/2020); Esophagogastroduodenoscopy (egd) with propofol  (N/A, 06/03/2020); Septoplasty (Bilateral, 05/26/2021); Cholecystectomy; Cystoscopy with retrograde pyelogram, ureteroscopy and stent placement (Right, 03/27/2022); Cystoscopy with biopsy (N/A, 03/27/2022); and Robot assited laparoscopic nephroureterectomy (Right, 06/03/2022). Family History:  His family history includes Coronary artery disease in an other family member; Hypertension in his father; Melanoma in his mother; Stroke in his father.  REVIEW OF SYSTEMS  : All other systems reviewed and negative except where noted in the History of Present Illness.  PHYSICAL EXAM: BP 122/62   Pulse (!) 59   Ht 5\' 9"  (1.753 m)   Wt 160 lb (72.6 kg)   BMI 23.63 kg/m  General Appearance: Well nourished, in no apparent distress. Head:   Normocephalic and atraumatic. Eyes:  sclerae anicteric,conjunctive pink  Respiratory: Respiratory effort normal, BS equal bilaterally with wheezing, without rales, rhonchi, Cardio: RRR with no MRGs. Peripheral pulses intact.  Abdomen: Soft,  Obese ,active bowel sounds.  Mild epigastric tenderness. Without guarding and Without rebound. No masses. Rectal: Not evaluated Musculoskeletal: Full ROM, Normal gait. Without edema. Skin:  Dry and intact without significant lesions or rashes Neuro: Alert and  oriented x4;  No focal deficits. Psych:   Cooperative. Normal mood and affect.    Edmonia Gottron, PA-C 3:50 PM

## 2023-06-25 NOTE — Patient Instructions (Addendum)
 Your provider has requested that you go to the basement level for lab work before leaving today. Press "B" on the elevator. The lab is located at the first door on the left as you exit the elevator.  We have sent the following medications to your pharmacy for you to pick up at your convenience: flagyl   You have been scheduled for a Barium Esophogram at [facility] (1st floor of the hospital) on 07/13/2023 at 11:00am Please arrive 30 minutes prior to your appointment for registration. Make certain not to have anything to eat or drink 3 hours prior to your test. If you need to reschedule for any reason, please contact radiology at [phone] to do so. __________________________________________________________________ A barium swallow is an examination that concentrates on views of the esophagus. This tends to be a double contrast exam (barium and two liquids which, when combined, create a gas to distend the wall of the oesophagus) or single contrast (non-ionic iodine based). The study is usually tailored to your symptoms so a good history is essential. Attention is paid during the study to the form, structure and configuration of the esophagus, looking for functional disorders (such as aspiration, dysphagia, achalasia, motility and reflux) EXAMINATION You may be asked to change into a gown, depending on the type of swallow being performed. A radiologist and radiographer will perform the procedure. The radiologist will advise you of the type of contrast selected for your procedure and direct you during the exam. You will be asked to stand, sit or lie in several different positions and to hold a small amount of fluid in your mouth before being asked to swallow while the imaging is performed .In some instances you may be asked to swallow barium coated marshmallows to assess the motility of a solid food bolus. The exam can be recorded as a digital or video fluoroscopy procedure. POST PROCEDURE It  will take 1-2 days for the barium to pass through your system. To facilitate this, it is important, unless otherwise directed, to increase your fluids for the next 24-48hrs and to resume your normal diet.  This test typically takes about 30 minutes to perform. __________________________________________________________________________________   Please take your proton pump inhibitor medication, pantoprazole   Please take this medication 30 minutes to 1 hour before meals- this makes it more effective.  Avoid spicy and acidic foods Avoid fatty foods Limit your intake of coffee, tea, alcohol, and carbonated drinks Work to maintain a healthy weight Keep the head of the bed elevated at least 3 inches with blocks or a wedge pillow if you are having any nighttime symptoms Stay upright for 2 hours after eating Avoid meals and snacks three to four hours before bedtime   Gastroparesis Please do small frequent meals like 4-6 meals a day.  Eat and drink liquids at separate times.  Avoid high fiber foods, cook your vegetables, avoid high fat food.  Suggest spreading protein throughout the day (greek yogurt, glucerna, soft meat, milk, eggs) Choose soft foods that you can mash with a fork When you are more symptomatic, change to pureed foods foods and liquids.  Consider reading "Living well with Gastroparesis" by Creasie Doctor Gastroparesis is a condition in which food takes longer than normal to empty from the stomach. This condition is also known as delayed gastric emptying. It is usually a long-term (chronic) condition. There is no cure, but there are treatments and things that you can do at home to help relieve symptoms. Treating the underlying condition that causes  gastroparesis can also help relieve symptoms What are the causes? In many cases, the cause of this condition is not known. Possible causes include: A hormone (endocrine) disorder, such as hypothyroidism or diabetes. A nervous system  disease, such as Parkinson's disease or multiple sclerosis. Cancer, infection, or surgery that affects the stomach or vagus nerve. The vagus nerve runs from your chest, through your neck, and to the lower part of your brain. A connective tissue disorder, such as scleroderma. Certain medicines. What increases the risk? You are more likely to develop this condition if: You have certain disorders or diseases. These may include: An endocrine disorder. An eating disorder. Amyloidosis. Scleroderma. Parkinson's disease. Multiple sclerosis. Cancer or infection of the stomach or the vagus nerve. You have had surgery on your stomach or vagus nerve. You take certain medicines. You are male. What are the signs or symptoms? Symptoms of this condition include: Feeling full after eating very little or a loss of appetite. Nausea, vomiting, or heartburn. Bloating of your abdomen. Inconsistent blood sugar (glucose) levels on blood tests. Unexplained weight loss. Acid from the stomach coming up into the esophagus (gastroesophageal reflux). Sudden tightening (spasm) of the stomach, which can be painful. Symptoms may come and go. Some people may not notice any symptoms. How is this diagnosed? This condition is diagnosed with tests, such as: Tests that check how long it takes food to move through the stomach and intestines. These tests include: Upper gastrointestinal (GI) series. For this test, you drink a liquid that shows up well on X-rays, and then X-rays are taken of your intestines. Gastric emptying scintigraphy. For this test, you eat food that contains a small amount of radioactive material, and then scans are taken. Wireless capsule GI monitoring system. For this test, you swallow a pill (capsule) that records information about how foods and fluid move through your stomach. Gastric manometry. For this test, a tube is passed down your throat and into your stomach to measure electrical and  muscular activity. Endoscopy. For this test, a long, thin tube with a camera and light on the end is passed down your throat and into your stomach to check for problems in your stomach lining. Ultrasound. This test uses sound waves to create images of the inside of your body. This can help rule out gallbladder disease or pancreatitis as a cause of your symptoms. How is this treated? There is no cure for this condition, but treatment and home care may relieve symptoms. Treatment may include: Treating the underlying cause. Managing your symptoms by making changes to your diet and exercise habits. Taking medicines to control nausea and vomiting and to stimulate stomach muscles. Getting food through a feeding tube in the hospital. This may be done in severe cases. Having surgery to insert a device called a gastric electrical stimulator into your body. This device helps improve stomach emptying and control nausea and vomiting. Follow these instructions at home: Take over-the-counter and prescription medicines only as told by your health care provider. Follow instructions from your health care provider about eating or drinking restrictions. Your health care provider may recommend that you: Eat smaller meals more often. Eat low-fat foods. Eat low-fiber forms of high-fiber foods. For example, eat cooked vegetables instead of raw vegetables. Have only liquid foods instead of solid foods. Liquid foods are easier to digest. Drink enough fluid to keep your urine pale yellow. Exercise as often as told by your health care provider. Keep all follow-up visits. This is important. Contact a  health care provider if you: Notice that your symptoms do not improve with treatment. Have new symptoms. Get help right away if you: Have severe pain in your abdomen that does not improve with treatment. Have nausea that is severe or does not go away. Vomit every time you drink fluids. Summary Gastroparesis is a  long-term (chronic) condition in which food takes longer than normal to empty from the stomach. Symptoms include nausea, vomiting, heartburn, bloating of your abdomen, and loss of appetite. Eating smaller portions, low-fat foods, and low-fiber forms of high-fiber foods may help you manage your symptoms. Get help right away if you have severe pain in your abdomen. This information is not intended to replace advice given to you by your health care provider. Make sure you discuss any questions you have with your health care provider. Document Revised: 05/15/2019 Document Reviewed: 05/15/2019 Elsevier Patient Education  2021 [employer]  We will contact you to set up a follow up appointment with Dr Yong Henle when his schedule opens up.   I appreciate the opportunity to care for you. Santina Cull, PA

## 2023-06-26 NOTE — Progress Notes (Signed)
 Attending Physician's Attestation   I have reviewed the chart.   I agree with the Advanced Practitioner's note, impression, and recommendations with any updates as below. Could consider mesenteric Dopplers to evaluate chronic mesenteric ischemia since high risk for angiography as well.  May be helpful to consider pelvic floor biofeedback as well for proctalgia fugax.   Yong Henle, MD [facility]oscopy Office # [phone]

## 2023-06-28 ENCOUNTER — Telehealth: Payer: Self-pay

## 2023-06-28 NOTE — Addendum Note (Signed)
 Addended by: Santina Cull on: 06/28/2023 10:00 AM   Modules accepted: Orders

## 2023-06-28 NOTE — Telephone Encounter (Signed)
-----   Message from Edmonia Gottron sent at 06/28/2023 10:00 AM EDT ----- Discussed with Dr. Brice Campi, please get mesenteric Dopplers to rule out chronic mesenteric ischemia since patient is unable to have contrast with his kidney function.  We can also offer pelvic floor physical therapy for rectal pain if he is interested.

## 2023-06-29 DIAGNOSIS — K5730 Diverticulosis of large intestine without perforation or abscess without bleeding: Secondary | ICD-10-CM | POA: Diagnosis not present

## 2023-06-29 DIAGNOSIS — C679 Malignant neoplasm of bladder, unspecified: Secondary | ICD-10-CM | POA: Diagnosis not present

## 2023-06-29 DIAGNOSIS — C651 Malignant neoplasm of right renal pelvis: Secondary | ICD-10-CM | POA: Diagnosis not present

## 2023-06-29 DIAGNOSIS — C678 Malignant neoplasm of overlapping sites of bladder: Secondary | ICD-10-CM | POA: Diagnosis not present

## 2023-06-29 NOTE — Telephone Encounter (Signed)
 MyChart message sent to patient with recommendations.  Secure staff message sent to radiology scheduling to contact patient to set up appt.

## 2023-06-30 NOTE — Telephone Encounter (Signed)
 US  mesenteric dopplers scheduled for 07/07/23

## 2023-07-01 ENCOUNTER — Ambulatory Visit (HOSPITAL_COMMUNITY)

## 2023-07-01 ENCOUNTER — Ambulatory Visit: Payer: Self-pay | Admitting: Physician Assistant

## 2023-07-01 DIAGNOSIS — K551 Chronic vascular disorders of intestine: Secondary | ICD-10-CM

## 2023-07-01 LAB — TSH: TSH: 3.15 u[IU]/mL (ref 0.35–5.50)

## 2023-07-05 ENCOUNTER — Encounter: Payer: Self-pay | Admitting: Family Medicine

## 2023-07-05 ENCOUNTER — Ambulatory Visit (INDEPENDENT_AMBULATORY_CARE_PROVIDER_SITE_OTHER): Admitting: Family Medicine

## 2023-07-05 ENCOUNTER — Other Ambulatory Visit

## 2023-07-05 ENCOUNTER — Ambulatory Visit: Payer: Self-pay

## 2023-07-05 VITALS — BP 100/50 | HR 87 | Temp 98.3°F | Wt 150.4 lb

## 2023-07-05 DIAGNOSIS — N179 Acute kidney failure, unspecified: Secondary | ICD-10-CM

## 2023-07-05 DIAGNOSIS — K529 Noninfective gastroenteritis and colitis, unspecified: Secondary | ICD-10-CM

## 2023-07-05 DIAGNOSIS — E875 Hyperkalemia: Secondary | ICD-10-CM

## 2023-07-05 DIAGNOSIS — N189 Chronic kidney disease, unspecified: Secondary | ICD-10-CM

## 2023-07-05 DIAGNOSIS — R634 Abnormal weight loss: Secondary | ICD-10-CM | POA: Diagnosis not present

## 2023-07-05 NOTE — Telephone Encounter (Signed)
 Noted

## 2023-07-05 NOTE — Telephone Encounter (Signed)
 FYI Only or Action Required?: FYI only for provider  Patient was last seen in primary care on 05/12/2023 by Zilphia Hilt, Charyl Coppersmith, MD. Called Nurse Triage reporting Diarrhea, Fatigue, and Back Pain. Symptoms began several days ago. Interventions attempted: OTC medications: Pepto Bismol, (daughter tried to get patient to take Imodium but he refused and stated he was unsure if he was allowed to take that) and Prescription medications: oxycodone . Symptoms are: lower abdominal pain wrapping around to lower back pain, watery foul odor diarrhea, generalized weakness, depression, gradually worsening.  Triage Disposition: See HCP Within 4 Hours (Or PCP Triage)  Patient/caregiver understands and will follow disposition?: Yes                    Reason for Disposition  [1] Constant abdominal pain AND [2] present > 2 hours  Answer Assessment - Initial Assessment Questions 1. DIARRHEA SEVERITY: How bad is the diarrhea? How many more stools have you had in the past 24 hours than normal?    - NO DIARRHEA (SCALE 0)   - MILD (SCALE 1-3): Few loose or mushy BMs; increase of 1-3 stools over normal daily number of stools; mild increase in ostomy output.   -  MODERATE (SCALE 4-7): Increase of 4-6 stools daily over normal; moderate increase in ostomy output.   -  SEVERE (SCALE 8-10; OR WORST POSSIBLE): Increase of 7 or more stools daily over normal; moderate increase in ostomy output; incontinence.     Unsure how many episodes he had yesterday and has had a few episodes this morning.  2. ONSET: When did the diarrhea begin?      Patient reports he has had it since Wednesday, Willetta Harpin states she has known about it since Saturday.  3. BM CONSISTENCY: How loose or watery is the diarrhea?      Watery.  4. VOMITING: Are you also vomiting? If Yes, ask: How many times in the past 24 hours?      No.  5. ABDOMEN PAIN: Are you having any abdomen pain? If Yes, ask: What does it feel  like? (e.g., crampy, dull, intermittent, constant)      Yes, lower abdomen goes all the way around to his back.  6. ABDOMEN PAIN SEVERITY: If present, ask: How bad is the pain?  (e.g., Scale 1-10; mild, moderate, or severe)   - MILD (1-3): doesn't interfere with normal activities, abdomen soft and not tender to touch    - MODERATE (4-7): interferes with normal activities or awakens from sleep, abdomen tender to touch    - SEVERE (8-10): excruciating pain, doubled over, unable to do any normal activities       Moderate.  7. ORAL INTAKE: If vomiting, Have you been able to drink liquids? How much liquids have you had in the past 24 hours?     No vomiting. Daughter states he ate ice cream, cake and a donut yesterday but would not eat the lunch she bought him. She states this morning he ate most of a bowl of cereal. She states in general he eats junk food and doesn't drink enough fluids.  8. HYDRATION: Any signs of dehydration? (e.g., dry mouth [not just dry lips], too weak to stand, dizziness, new weight loss) When did you last urinate?     She states she is concerned about dehydration, she states he is eating and drinking but she does not think he is drinking enough. She states patient is making urine still and is weak, but  states he is able to stand and walk on his own.  9. EXPOSURE: Have you traveled to a foreign country recently? Have you been exposed to anyone with diarrhea? Could you have eaten any food that was spoiled?     No.  10. ANTIBIOTIC USE: Are you taking antibiotics now or have you taken antibiotics in the past 2 months?       Flagyl  in the past month.  11. OTHER SYMPTOMS: Do you have any other symptoms? (e.g., fever, blood in stool)       Lower back pain, generalized weakness, foul odor to stool, depression.  12. PREGNANCY: Is there any chance you are pregnant? When was your last menstrual period?       N/A.  Patient's daughter denies fever, chest  pain, vomiting. Daughter states she tried to take him to the ED yesterday and he refused. She states she is concerned due to he does not drink enough fluids and she does not want him to get dehydrated. She states she is also concerned that his venlafaxine  is not effective for his depression and she thinks he needs to talk to someone about it.  Protocols used: [facility]

## 2023-07-05 NOTE — Patient Instructions (Signed)
 Increase fluid intake to at least 2 liters per day (4 water  bottles)  Consider supplement such as Glucerna once daily  I will be putting in nephrology consultation- but will check lab today first.

## 2023-07-05 NOTE — Progress Notes (Signed)
 Established Patient Office Visit  Subjective   Patient ID: [name], male    DOB: Jun 09, 1940  Age: 83 y.o. MRN: [id number]  Chief Complaint  Patient presents with   Diarrhea    Patient complains of diarrhea, xr5 days    Abdominal Pain    Patient complains of abdominal pain, x5 days    Fatigue    Patient complains of fatigue, x5 days    Back Pain    Patient complains of back pain, x5 days     HPI   [name] is seen today as a work in.  He has multiple chronic problems include history of diastolic heart failure, CAD, hypertension, GERD, Barrett's esophagus, chronic kidney disease, history of pancreatitis, chronic diarrhea, history of renal cancer, chronic normocytic anemia, history of bladder cancer.  Followed by multiple specialist.  Seen today with multitude of complaints which are somewhat migratory.  He has had some diarrhea for the past few days but apparently this is not new.  No bloody diarrhea.  Has had some vague mid abdominal pains and again this sounds to be chronic as well.  Had some recent more acute low back pain but was very transient.  Denies any fall or injury.  Increased fatigue past few days.  Had pancreatic elastase done 6 months ago which was normal.  No recent antibiotics.  Had multiple labs recently with GI including CBC, CMP, TSH, lipase, sed rate.  Sed rate was 14.  Lipase normal.  Recent A1c 5.5.  Testing was 5.8 and creatinine up to 2.78 which is above his prior baseline around 1.8.  Daughter states he does not drink much.  Family thinks he may be chronically dehydrated.  No recent reported fever.  Followed by multiple specialist including urology, gastroenterology, orthopedics.  Is on chronic oxycodone   Past Medical History:  Diagnosis Date   Allergy     Anxiety    B12 deficiency anemia    Blood transfusion without reported diagnosis    CAD (coronary artery disease)    Cancer (HCC)    bladder, Right renal cell also   Chronic kidney disease     Colon polyps    COPD (chronic obstructive pulmonary disease) (HCC)    Depression    Diabetes mellitus (HCC)    Dyspnea    Esophagus, Barrett's    GERD (gastroesophageal reflux disease)    Headache    History of bladder cancer    Bladder cancer 8 times   History of hiatal hernia    Hyperlipidemia    Hypertension    Localized osteoarthrosis, lower leg    Myocardial infarction (HCC) 2021   Pneumonia    Restless leg syndrome    Sleep apnea    does not wear cpap   Stenosis of esophagus    Past Surgical History:  Procedure Laterality Date   BILIARY BRUSHING  04/01/2018   Procedure: BILIARY BRUSHING;  Surgeon: Normie Becton., MD;  Location: [facility] ENDOSCOPY;  Service: Gastroenterology;;   BILIARY BRUSHING  09/12/2018   Procedure: BILIARY BRUSHING;  Surgeon: Normie Becton., MD;  Location: [facility] ENDOSCOPY;  Service: Gastroenterology;;   BILIARY BRUSHING  11/28/2018   Procedure: BILIARY BRUSHING;  Surgeon: Normie Becton., MD;  Location: [facility] ENDOSCOPY;  Service: Gastroenterology;;   BILIARY BRUSHING  03/11/2020   Procedure: BILIARY BRUSHING;  Surgeon: Normie Becton., MD;  Location: [facility] ENDOSCOPY;  Service: Gastroenterology;;   BILIARY DILATION  09/12/2018   Procedure: BILIARY DILATION;  Surgeon: Normie Becton.,  MD;  Location: [facility] ENDOSCOPY;  Service: Gastroenterology;;   BILIARY DILATION  11/28/2018   Procedure: BILIARY DILATION;  Surgeon: Normie Becton., MD;  Location: [facility] ENDOSCOPY;  Service: Gastroenterology;;   BILIARY DILATION  03/11/2020   Procedure: BILIARY DILATION;  Surgeon: Normie Becton., MD;  Location: [facility] ENDOSCOPY;  Service: Gastroenterology;;   BILIARY STENT PLACEMENT  04/01/2018   Procedure: BILIARY STENT PLACEMENT;  Surgeon: Normie Becton., MD;  Location: [facility] ENDOSCOPY;  Service: Gastroenterology;;   BILIARY STENT PLACEMENT  09/12/2018   Procedure: BILIARY STENT PLACEMENT;  Surgeon: Normie Becton.,  MD;  Location: [facility] ENDOSCOPY;  Service: Gastroenterology;;   BILIARY STENT PLACEMENT  11/28/2018   Procedure: BILIARY STENT PLACEMENT;  Surgeon: Normie Becton., MD;  Location: [facility] ENDOSCOPY;  Service: Gastroenterology;;   BIOPSY  04/01/2018   Procedure: BIOPSY;  Surgeon: Normie Becton., MD;  Location: [facility] ENDOSCOPY;  Service: Gastroenterology;;   BIOPSY  09/12/2018   Procedure: BIOPSY;  Surgeon: Normie Becton., MD;  Location: [facility] ENDOSCOPY;  Service: Gastroenterology;;   BIOPSY  11/28/2018   Procedure: BIOPSY;  Surgeon: Normie Becton., MD;  Location: [facility] ENDOSCOPY;  Service: Gastroenterology;;   BIOPSY  03/11/2020   Procedure: BIOPSY;  Surgeon: Normie Becton., MD;  Location: [facility] ENDOSCOPY;  Service: Gastroenterology;;   bladder cancer      x 8 cystoscopy   CERVICAL DISCECTOMY     ACDF   CHOLECYSTECTOMY     COLONOSCOPY  11/17/2005   normal    CORONARY ARTERY BYPASS GRAFT     x4   CORONARY STENT INTERVENTION N/A 01/05/2019   Procedure: CORONARY STENT INTERVENTION;  Surgeon: Sammy Crisp, MD;  Location: [facility] INVASIVE CV LAB;  Service: Cardiovascular;  Laterality: N/A;   CYSTOSCOPY WITH BIOPSY N/A 03/27/2022   Procedure: CYSTOSCOPY WITH BIOPSY;  Surgeon: Homero Luster, MD;  Location: [facility] ORS;  Service: Urology;  Laterality: N/A;   CYSTOSCOPY WITH RETROGRADE PYELOGRAM, URETEROSCOPY AND STENT PLACEMENT Right 03/27/2022   Procedure: CYSTOSCOPY WITH RIGHT RETROGRADE PYELOGRAM, URETEROSCOPY AND STENT PLACEMENT urethral dilation;  Surgeon: Homero Luster, MD;  Location: [facility] ORS;  Service: Urology;  Laterality: Right;   ENDOSCOPIC MUCOSAL RESECTION  09/12/2018   Procedure: ENDOSCOPIC MUCOSAL RESECTION;  Surgeon: Brice Campi Albino Alu., MD;  Location: [facility] ENDOSCOPY;  Service: Gastroenterology;;   ENDOSCOPIC RETROGRADE CHOLANGIOPANCREATOGRAPHY (ERCP) WITH PROPOFOL  N/A 04/01/2018   Procedure: ENDOSCOPIC RETROGRADE CHOLANGIOPANCREATOGRAPHY (ERCP) WITH PROPOFOL ;  Surgeon:  Normie Becton., MD;  Location: [facility] ENDOSCOPY;  Service: Gastroenterology;  Laterality: N/A;   ENDOSCOPIC RETROGRADE CHOLANGIOPANCREATOGRAPHY (ERCP) WITH PROPOFOL  N/A 09/12/2018   Procedure: ENDOSCOPIC RETROGRADE CHOLANGIOPANCREATOGRAPHY (ERCP) WITH PROPOFOL ;  Surgeon: Brice Campi Albino Alu., MD;  Location: [facility] ENDOSCOPY;  Service: Gastroenterology;  Laterality: N/A;   ENDOSCOPIC RETROGRADE CHOLANGIOPANCREATOGRAPHY (ERCP) WITH PROPOFOL  N/A 03/11/2020   Procedure: ENDOSCOPIC RETROGRADE CHOLANGIOPANCREATOGRAPHY (ERCP) WITH PROPOFOL ;  Surgeon: Brice Campi Albino Alu., MD;  Location: [facility] ENDOSCOPY;  Service: Gastroenterology;  Laterality: N/A;   ENDOSCOPIC RETROGRADE CHOLANGIOPANCREATOGRAPHY (ERCP) WITH PROPOFOL  N/A 06/03/2020   Procedure: ENDOSCOPIC RETROGRADE CHOLANGIOPANCREATOGRAPHY (ERCP) WITH PROPOFOL ;  Surgeon: Brice Campi Albino Alu., MD;  Location: [facility] ENDOSCOPY;  Service: Gastroenterology;  Laterality: N/A;   ERCP N/A 11/28/2018   Procedure: ENDOSCOPIC RETROGRADE CHOLANGIOPANCREATOGRAPHY (ERCP) +EGD with spyglass;  Surgeon: Brice Campi Albino Alu., MD;  Location: [facility] ENDOSCOPY;  Service: Gastroenterology;  Laterality: N/A;   ESOPHAGOGASTRODUODENOSCOPY  04/29/2010   ESOPHAGOGASTRODUODENOSCOPY (EGD) WITH PROPOFOL  N/A 04/01/2018   Procedure: ESOPHAGOGASTRODUODENOSCOPY (EGD) WITH PROPOFOL ;  Surgeon: Brice Campi Albino Alu., MD;  Location: [facility] ENDOSCOPY;  Service: Gastroenterology;  Laterality: N/A;   ESOPHAGOGASTRODUODENOSCOPY (EGD) WITH PROPOFOL  N/A 09/12/2018   Procedure: ESOPHAGOGASTRODUODENOSCOPY (EGD) WITH PROPOFOL ;  Surgeon: Brice Campi Albino Alu., MD;  Location: [facility] ENDOSCOPY;  Service: Gastroenterology;  Laterality: N/A;   ESOPHAGOGASTRODUODENOSCOPY (EGD) WITH PROPOFOL  N/A 11/28/2018   Procedure: ESOPHAGOGASTRODUODENOSCOPY (EGD) WITH PROPOFOL ;  Surgeon: Brice Campi Albino Alu., MD;  Location: [facility] ENDOSCOPY;  Service: Gastroenterology;  Laterality: N/A;   ESOPHAGOGASTRODUODENOSCOPY (EGD)  WITH PROPOFOL  N/A 03/11/2020   Procedure: ESOPHAGOGASTRODUODENOSCOPY (EGD) WITH PROPOFOL ;  Surgeon: Brice Campi Albino Alu., MD;  Location: [facility] ENDOSCOPY;  Service: Gastroenterology;  Laterality: N/A;   ESOPHAGOGASTRODUODENOSCOPY (EGD) WITH PROPOFOL  N/A 06/03/2020   Procedure: ESOPHAGOGASTRODUODENOSCOPY (EGD) WITH PROPOFOL ;  Surgeon: Brice Campi Albino Alu., MD;  Location: [facility] ENDOSCOPY;  Service: Gastroenterology;  Laterality: N/A;   EUS  04/01/2018   Procedure: FULL UPPER ENDOSCOPIC ULTRASOUND (EUS) RADIAL;  Surgeon: Normie Becton., MD;  Location: [facility] ENDOSCOPY;  Service: Gastroenterology;;   EUS N/A 09/12/2018   Procedure: UPPER ENDOSCOPIC ULTRASOUND (EUS) RADIAL;  Surgeon: Normie Becton., MD;  Location: [facility] ENDOSCOPY;  Service: Gastroenterology;  Laterality: N/A;   FINE NEEDLE ASPIRATION  09/12/2018   Procedure: FINE NEEDLE ASPIRATION (FNA) LINEAR;  Surgeon: Normie Becton., MD;  Location: [facility] ENDOSCOPY;  Service: Gastroenterology;;   HAND SURGERY Left 2018   saw accident   HEMOSTASIS CLIP PLACEMENT  09/12/2018   Procedure: HEMOSTASIS CLIP PLACEMENT;  Surgeon: Normie Becton., MD;  Location: [facility] ENDOSCOPY;  Service: Gastroenterology;;   HEMOSTASIS CLIP PLACEMENT  06/03/2020   Procedure: HEMOSTASIS CLIP PLACEMENT;  Surgeon: Normie Becton., MD;  Location: [facility] ENDOSCOPY;  Service: Gastroenterology;;   I & D EXTREMITY Left 11/24/2016   Procedure: IRRIGATION AND DEBRIDEMENT LEFT HAND, THUMB, INDEX, MIDDLE, RING, AND SMALL FINGERS WITH RECONSTRUCTION;  Surgeon: Ronn Cohn, MD;  Location: [facility] OR;  Service: Orthopedics;  Laterality: Left;   KNEE ARTHROSCOPY Left    LEFT HEART CATH AND CORS/GRAFTS ANGIOGRAPHY N/A 01/05/2019   Procedure: LEFT HEART CATH AND CORS/GRAFTS ANGIOGRAPHY;  Surgeon: Sammy Crisp, MD;  Location: [facility] INVASIVE CV LAB;  Service: Cardiovascular;  Laterality: N/A;   LUMBAR LAMINECTOMY     and fusion x 2   NASAL SINUS SURGERY      POPLITEAL SYNOVIAL CYST EXCISION     REMOVAL OF STONES  04/01/2018   Procedure: REMOVAL OF STONES;  Surgeon: Brice Campi Albino Alu., MD;  Location: [facility] ENDOSCOPY;  Service: Gastroenterology;;   REMOVAL OF STONES  09/12/2018   Procedure: REMOVAL OF STONES;  Surgeon: Normie Becton., MD;  Location: [facility] ENDOSCOPY;  Service: Gastroenterology;;   REMOVAL OF STONES  11/28/2018   Procedure: REMOVAL OF STONES;  Surgeon: Normie Becton., MD;  Location: [facility] ENDOSCOPY;  Service: Gastroenterology;;   REMOVAL OF STONES  03/11/2020   Procedure: REMOVAL OF STONES;  Surgeon: Normie Becton., MD;  Location: [facility] ENDOSCOPY;  Service: Gastroenterology;;   REMOVAL OF STONES  06/03/2020   Procedure: REMOVAL OF STONES;  Surgeon: Normie Becton., MD;  Location: [facility] ENDOSCOPY;  Service: Gastroenterology;;   ROBOT ASSITED LAPAROSCOPIC NEPHROURETERECTOMY Right 06/03/2022   Procedure: XI ROBOT ASSITED LAPAROSCOPIC NEPHROURETERECTOMY, FLEXIBLE CYSTOSCOPY;  Surgeon: Melody Spurling., MD;  Location: [facility] ORS;  Service: Urology;  Laterality: Right;  3 HRS   SAVORY DILATION N/A 09/12/2018   Procedure: SAVORY DILATION;  Surgeon: Brice Campi Albino Alu., MD;  Location: [facility] ENDOSCOPY;  Service: Gastroenterology;  Laterality: N/A;   SAVORY DILATION N/A 11/28/2018   Procedure: SAVORY DILATION;  Surgeon: Brice Campi Albino Alu., MD;  Location: [facility] ENDOSCOPY;  Service: Gastroenterology;  Laterality: N/A;   SEPTOPLASTY Bilateral 05/26/2021   Procedure: SEPTOPLASTY;  Surgeon: Reynold Caves, MD;  Location:  SURGERY CENTER;  Service: ENT;  Laterality: Bilateral;   SPHINCTEROTOMY  04/01/2018   Procedure: [address];  Surgeon: Mansouraty, Albino Alu., MD;  Location: [facility] ENDOSCOPY;  Service: Gastroenterology;;   Alberteen Aloe CHOLANGIOSCOPY N/A 11/28/2018   Procedure: YNWGNFAO CHOLANGIOSCOPY;  Surgeon: Normie Becton., MD;  Location: [facility] ENDOSCOPY;  Service: Gastroenterology;  Laterality: N/A;    SPYGLASS CHOLANGIOSCOPY N/A 06/03/2020   Procedure: SPYGLASS CHOLANGIOSCOPY;  Surgeon: Normie Becton., MD;  Location: [facility] ENDOSCOPY;  Service: Gastroenterology;  Laterality: N/A;   STENT REMOVAL  09/12/2018   Procedure: STENT REMOVAL;  Surgeon: Normie Becton., MD;  Location: [facility] ENDOSCOPY;  Service: Gastroenterology;;   [place] REMOVAL  11/28/2018   Procedure: STENT REMOVAL;  Surgeon: Normie Becton., MD;  Location[facility] ENDOSCOPY;  Service: Gastroenterology;;   Y[place]onne Herald REMOVAL  03/11/2020   Procedure: STENT REMOVAL;  Surgeon: Normie Becton., MD;  Location: [facility] ENDOSCOPY;  Service: Gastroenterology;;   SUBMUCOSAL LIFTING INJECTION  09/12/2018   Procedure: SUBMUCOSAL LIFTING INJECTION;  Surgeon: Normie Becton., MD;  Location: [facility] ENDOSCOPY;  Service: Gastroenterology;;    reports that he quit smoking about 47 years ago. His smoking use included cigarettes. He started smoking about 52 years ago. He has a 2.5 pack-year smoking history. He has never used smokeless tobacco. He reports that he does not drink alcohol and does not use drugs. family history includes Coronary artery disease in an other family member; Hypertension in his father; Melanoma in his mother; Stroke in his father. No Known Allergies  Review of Systems  Constitutional:  Positive for malaise/fatigue and weight loss. Negative for chills and fever.  Respiratory:  Negative for cough and hemoptysis.   Cardiovascular:  Negative for chest pain and leg swelling.  Gastrointestinal:  Positive for abdominal pain and diarrhea. Negative for blood in stool and melena.  Genitourinary:  Negative for dysuria.  Musculoskeletal:  Positive for back pain.      Objective:     BP (!) 100/50 (BP Location: Left Arm, Patient Position: Sitting, Cuff Size: Normal)   Pulse 87   Temp 98.3 F (36.8 C) (Oral)   Wt 150 lb 6.4 oz (68.2 kg)   SpO2 95%   BMI 22.21 kg/m  BP Readings from Last 3 Encounters:  07/05/23  (!) 100/50  06/25/23 122/62  05/19/23 108/62   Wt Readings from Last 3 Encounters:  07/05/23 150 lb 6.4 oz (68.2 kg)  06/25/23 160 lb (72.6 kg)  05/19/23 167 lb 3.2 oz (75.8 kg)      Physical Exam Vitals reviewed.  HENT:     Mouth/Throat:     Comments: Oropharynx reveals dry tongue and dry posterior pharynx  Cardiovascular:     Rate and Rhythm: Normal rate and regular rhythm.  Pulmonary:     Effort: Pulmonary effort is normal.     Breath sounds: Normal breath sounds.  Abdominal:     Comments: Abdomen is soft with no localizing tenderness.  No guarding or rebound.  Genitourinary:    Testes:        Right: Swelling not present.        Left: Swelling not present.   Neurological:     Mental Status: He is alert.      No results found for any visits on 07/05/23.  Last CBC Lab Results  Component Value Date   WBC 9.9 06/25/2023  HGB 12.1 (L) 06/25/2023   HCT 36.0 (L) 06/25/2023   MCV 85.9 06/25/2023   MCH 28.3 05/22/2022   RDW 14.4 06/25/2023   PLT 171.0 06/25/2023   Last metabolic panel Lab Results  Component Value Date   GLUCOSE 114 (H) 06/25/2023   NA 141 06/25/2023   K 5.8 No hemolysis seen (H) 06/25/2023   CL 107 06/25/2023   CO2 30 06/25/2023   BUN 53 (H) 06/25/2023   CREATININE 2.78 (H) 06/25/2023   GFR 20.75 (L) 06/25/2023   CALCIUM  8.6 06/25/2023   PROT 6.8 06/25/2023   ALBUMIN  4.1 06/25/2023   LABGLOB 3.0 12/09/2020   AGRATIO 1.4 12/09/2020   BILITOT 0.4 06/25/2023   ALKPHOS 65 06/25/2023   AST 27 06/25/2023   ALT 15 06/25/2023   ANIONGAP 7 06/04/2022   Last hemoglobin A1c Lab Results  Component Value Date   HGBA1C 5.5 05/12/2023   Last thyroid  functions Lab Results  Component Value Date   TSH 3.15 06/25/2023      The ASCVD Risk score (Arnett DK, et al., 2019) failed to calculate for the following reasons:   The 2019 ASCVD risk score is only valid for ages 56 to 66   Risk score cannot be calculated because patient has a medical  history suggesting prior/existing ASCVD    Assessment & Plan:   Mr. Shiraishi has complex past medical history as above.  Followed by multiple specialists.  Seen today with multitude of complaints.  Appears on exam to be dehydrated and had recent labs significant for hyperkalemia with potassium 5.8.  He does not take any potassium supplements.  Recommend:  -Recheck basic metabolic panel - Increase hydration.  We recommended goal of at least 2 L/day - Consider nutrition supplements such as Glucerna - Consider nephrology consultation but will get labs back first   No follow-ups on file.    Glean Lamy, MD

## 2023-07-06 ENCOUNTER — Ambulatory Visit: Payer: Self-pay | Admitting: Family Medicine

## 2023-07-06 ENCOUNTER — Other Ambulatory Visit: Payer: Self-pay | Admitting: Internal Medicine

## 2023-07-06 DIAGNOSIS — N289 Disorder of kidney and ureter, unspecified: Secondary | ICD-10-CM

## 2023-07-06 LAB — BASIC METABOLIC PANEL WITH GFR
BUN/Creatinine Ratio: 16 (calc) (ref 6–22)
BUN: 52 mg/dL — ABNORMAL HIGH (ref 7–25)
CO2: 19 mmol/L — ABNORMAL LOW (ref 20–32)
Calcium: 8.5 mg/dL — ABNORMAL LOW (ref 8.6–10.3)
Chloride: 107 mmol/L (ref 98–110)
Creat: 3.35 mg/dL — ABNORMAL HIGH (ref 0.70–1.22)
Glucose, Bld: 95 mg/dL (ref 65–99)
Potassium: 5.3 mmol/L (ref 3.5–5.3)
Sodium: 139 mmol/L (ref 135–146)
eGFR: 18 mL/min/{1.73_m2} — ABNORMAL LOW (ref >=60)

## 2023-07-07 ENCOUNTER — Emergency Department (HOSPITAL_COMMUNITY)

## 2023-07-07 ENCOUNTER — Other Ambulatory Visit: Payer: Self-pay

## 2023-07-07 ENCOUNTER — Encounter (HOSPITAL_COMMUNITY): Payer: Self-pay | Admitting: Radiology

## 2023-07-07 ENCOUNTER — Inpatient Hospital Stay (HOSPITAL_COMMUNITY)
Admission: EM | Admit: 2023-07-07 | Discharge: 2023-07-10 | DRG: 371 | Disposition: A | Discharge status: Home Health | Attending: Family Medicine | Admitting: Family Medicine

## 2023-07-07 ENCOUNTER — Ambulatory Visit (HOSPITAL_COMMUNITY): Admission: RE | Admit: 2023-07-07 | Source: Ambulatory Visit

## 2023-07-07 DIAGNOSIS — R197 Diarrhea, unspecified: Secondary | ICD-10-CM

## 2023-07-07 DIAGNOSIS — Z951 Presence of aortocoronary bypass graft: Secondary | ICD-10-CM

## 2023-07-07 DIAGNOSIS — Z7982 Long term (current) use of aspirin: Secondary | ICD-10-CM | POA: Diagnosis not present

## 2023-07-07 DIAGNOSIS — K219 Gastro-esophageal reflux disease without esophagitis: Secondary | ICD-10-CM | POA: Diagnosis present

## 2023-07-07 DIAGNOSIS — Z860100 Personal history of colon polyps, unspecified: Secondary | ICD-10-CM

## 2023-07-07 DIAGNOSIS — E860 Dehydration: Secondary | ICD-10-CM | POA: Diagnosis present

## 2023-07-07 DIAGNOSIS — Z905 Acquired absence of kidney: Secondary | ICD-10-CM

## 2023-07-07 DIAGNOSIS — Z87891 Personal history of nicotine dependence: Secondary | ICD-10-CM | POA: Diagnosis not present

## 2023-07-07 DIAGNOSIS — N170 Acute kidney failure with tubular necrosis: Principal | ICD-10-CM | POA: Diagnosis present

## 2023-07-07 DIAGNOSIS — M549 Dorsalgia, unspecified: Secondary | ICD-10-CM | POA: Diagnosis present

## 2023-07-07 DIAGNOSIS — G8929 Other chronic pain: Secondary | ICD-10-CM | POA: Diagnosis present

## 2023-07-07 DIAGNOSIS — E039 Hypothyroidism, unspecified: Secondary | ICD-10-CM | POA: Diagnosis not present

## 2023-07-07 DIAGNOSIS — I252 Old myocardial infarction: Secondary | ICD-10-CM | POA: Diagnosis not present

## 2023-07-07 DIAGNOSIS — Z823 Family history of stroke: Secondary | ICD-10-CM

## 2023-07-07 DIAGNOSIS — Z8249 Family history of ischemic heart disease and other diseases of the circulatory system: Secondary | ICD-10-CM

## 2023-07-07 DIAGNOSIS — K6289 Other specified diseases of anus and rectum: Secondary | ICD-10-CM | POA: Diagnosis present

## 2023-07-07 DIAGNOSIS — I2510 Atherosclerotic heart disease of native coronary artery without angina pectoris: Secondary | ICD-10-CM | POA: Diagnosis not present

## 2023-07-07 DIAGNOSIS — E785 Hyperlipidemia, unspecified: Secondary | ICD-10-CM | POA: Diagnosis present

## 2023-07-07 DIAGNOSIS — R519 Headache, unspecified: Secondary | ICD-10-CM | POA: Diagnosis not present

## 2023-07-07 DIAGNOSIS — J159 Unspecified bacterial pneumonia: Secondary | ICD-10-CM | POA: Diagnosis present

## 2023-07-07 DIAGNOSIS — I10 Essential (primary) hypertension: Secondary | ICD-10-CM | POA: Diagnosis present

## 2023-07-07 DIAGNOSIS — R531 Weakness: Secondary | ICD-10-CM

## 2023-07-07 DIAGNOSIS — E871 Hypo-osmolality and hyponatremia: Secondary | ICD-10-CM | POA: Diagnosis not present

## 2023-07-07 DIAGNOSIS — Z7902 Long term (current) use of antithrombotics/antiplatelets: Secondary | ICD-10-CM | POA: Diagnosis not present

## 2023-07-07 DIAGNOSIS — N184 Chronic kidney disease, stage 4 (severe): Secondary | ICD-10-CM | POA: Diagnosis present

## 2023-07-07 DIAGNOSIS — E875 Hyperkalemia: Principal | ICD-10-CM | POA: Diagnosis present

## 2023-07-07 DIAGNOSIS — Z981 Arthrodesis status: Secondary | ICD-10-CM

## 2023-07-07 DIAGNOSIS — N179 Acute kidney failure, unspecified: Secondary | ICD-10-CM | POA: Diagnosis present

## 2023-07-07 DIAGNOSIS — E1122 Type 2 diabetes mellitus with diabetic chronic kidney disease: Secondary | ICD-10-CM | POA: Diagnosis present

## 2023-07-07 DIAGNOSIS — G2581 Restless legs syndrome: Secondary | ICD-10-CM | POA: Diagnosis not present

## 2023-07-07 DIAGNOSIS — M48061 Spinal stenosis, lumbar region without neurogenic claudication: Secondary | ICD-10-CM | POA: Diagnosis not present

## 2023-07-07 DIAGNOSIS — A0472 Enterocolitis due to Clostridium difficile, not specified as recurrent: Principal | ICD-10-CM | POA: Diagnosis present

## 2023-07-07 DIAGNOSIS — Z8551 Personal history of malignant neoplasm of bladder: Secondary | ICD-10-CM

## 2023-07-07 DIAGNOSIS — Z955 Presence of coronary angioplasty implant and graft: Secondary | ICD-10-CM

## 2023-07-07 DIAGNOSIS — J189 Pneumonia, unspecified organism: Secondary | ICD-10-CM

## 2023-07-07 DIAGNOSIS — R109 Unspecified abdominal pain: Secondary | ICD-10-CM | POA: Diagnosis not present

## 2023-07-07 DIAGNOSIS — Z9049 Acquired absence of other specified parts of digestive tract: Secondary | ICD-10-CM

## 2023-07-07 DIAGNOSIS — I129 Hypertensive chronic kidney disease with stage 1 through stage 4 chronic kidney disease, or unspecified chronic kidney disease: Secondary | ICD-10-CM | POA: Diagnosis present

## 2023-07-07 DIAGNOSIS — Z79899 Other long term (current) drug therapy: Secondary | ICD-10-CM

## 2023-07-07 DIAGNOSIS — R0602 Shortness of breath: Secondary | ICD-10-CM | POA: Diagnosis not present

## 2023-07-07 DIAGNOSIS — J440 Chronic obstructive pulmonary disease with acute lower respiratory infection: Secondary | ICD-10-CM | POA: Diagnosis not present

## 2023-07-07 DIAGNOSIS — M545 Low back pain, unspecified: Secondary | ICD-10-CM | POA: Diagnosis not present

## 2023-07-07 DIAGNOSIS — I6782 Cerebral ischemia: Secondary | ICD-10-CM | POA: Diagnosis not present

## 2023-07-07 DIAGNOSIS — Z85528 Personal history of other malignant neoplasm of kidney: Secondary | ICD-10-CM

## 2023-07-07 DIAGNOSIS — Z808 Family history of malignant neoplasm of other organs or systems: Secondary | ICD-10-CM

## 2023-07-07 LAB — CBC WITH DIFFERENTIAL/PLATELET
Abs Immature Granulocytes: 0.02 10*3/uL (ref 0.00–0.07)
Basophils Absolute: 0 10*3/uL (ref 0.0–0.1)
Basophils Relative: 0 %
Eosinophils Absolute: 0 10*3/uL (ref 0.0–0.5)
Eosinophils Relative: 1 %
HCT: 42.1 % (ref 39.0–52.0)
Hemoglobin: 13 g/dL (ref 13.0–17.0)
Immature Granulocytes: 0 %
Lymphocytes Relative: 30 %
Lymphs Abs: 2.1 10*3/uL (ref 0.7–4.0)
MCH: 28 pg (ref 26.0–34.0)
MCHC: 30.9 g/dL (ref 30.0–36.0)
MCV: 90.5 fL (ref 80.0–100.0)
Monocytes Absolute: 0.5 10*3/uL (ref 0.1–1.0)
Monocytes Relative: 7 %
Neutro Abs: 4.2 10*3/uL (ref 1.7–7.7)
Neutrophils Relative %: 62 %
Platelets: 103 10*3/uL — ABNORMAL LOW (ref 150–400)
RBC: 4.65 MIL/uL (ref 4.22–5.81)
RDW: 14.2 % (ref 11.5–15.5)
WBC: 6.9 10*3/uL (ref 4.0–10.5)
nRBC: 0 % (ref 0.0–0.2)

## 2023-07-07 LAB — URINALYSIS, W/ REFLEX TO CULTURE (INFECTION SUSPECTED)
Bilirubin Urine: NEGATIVE
Glucose, UA: NEGATIVE mg/dL
Hgb urine dipstick: NEGATIVE
Ketones, ur: NEGATIVE mg/dL
Leukocytes,Ua: NEGATIVE
Nitrite: NEGATIVE
Protein, ur: 100 mg/dL — AB
Specific Gravity, Urine: 1.014 (ref 1.005–1.030)
pH: 5 (ref 5.0–8.0)

## 2023-07-07 LAB — COMPREHENSIVE METABOLIC PANEL WITH GFR
ALT: 41 U/L (ref 0–44)
AST: 82 U/L — ABNORMAL HIGH (ref 15–41)
Albumin: 3.3 g/dL — ABNORMAL LOW (ref 3.5–5.0)
Alkaline Phosphatase: 71 U/L (ref 38–126)
Anion gap: 9 (ref 5–15)
BUN: 73 mg/dL — ABNORMAL HIGH (ref 8–23)
CO2: 22 mmol/L (ref 22–32)
Calcium: 8.4 mg/dL — ABNORMAL LOW (ref 8.9–10.3)
Chloride: 111 mmol/L (ref 98–111)
Creatinine, Ser: 3.76 mg/dL — ABNORMAL HIGH (ref 0.61–1.24)
GFR, Estimated: 15 mL/min — ABNORMAL LOW (ref >60)
Glucose, Bld: 109 mg/dL — ABNORMAL HIGH (ref 70–99)
Potassium: 6.3 mmol/L (ref 3.5–5.1)
Sodium: 142 mmol/L (ref 135–145)
Total Bilirubin: 0.8 mg/dL (ref 0.0–1.2)
Total Protein: 6.9 g/dL (ref 6.5–8.1)

## 2023-07-07 LAB — MAGNESIUM: Magnesium: 1.5 mg/dL — ABNORMAL LOW (ref 1.7–2.4)

## 2023-07-07 LAB — BASIC METABOLIC PANEL WITH GFR
Anion gap: 8 (ref 5–15)
BUN: 65 mg/dL — ABNORMAL HIGH (ref 8–23)
CO2: 22 mmol/L (ref 22–32)
Calcium: 7.5 mg/dL — ABNORMAL LOW (ref 8.9–10.3)
Chloride: 112 mmol/L — ABNORMAL HIGH (ref 98–111)
Creatinine, Ser: 3.15 mg/dL — ABNORMAL HIGH (ref 0.61–1.24)
GFR, Estimated: 19 mL/min — ABNORMAL LOW (ref >60)
Glucose, Bld: 76 mg/dL (ref 70–99)
Potassium: 5.3 mmol/L — ABNORMAL HIGH (ref 3.5–5.1)
Sodium: 142 mmol/L (ref 135–145)

## 2023-07-07 LAB — CBG MONITORING, ED
Glucose-Capillary: 85 mg/dL (ref 70–99)
Glucose-Capillary: 112 mg/dL — ABNORMAL HIGH (ref 70–99)

## 2023-07-07 LAB — LIPASE, BLOOD: Lipase: 71 U/L — ABNORMAL HIGH (ref 11–51)

## 2023-07-07 MED ORDER — INSULIN ASPART 100 UNIT/ML IV SOLN
5.0000 [IU] | Freq: Once | INTRAVENOUS | Status: AC
Start: 2023-07-07 — End: 2023-07-07
  Administered 2023-07-07: 5 [IU] via INTRAVENOUS
  Filled 2023-07-07: qty 0.05

## 2023-07-07 MED ORDER — ONDANSETRON HCL 4 MG/2ML IJ SOLN
4.0000 mg | Freq: Once | INTRAMUSCULAR | Status: AC
  Administered 2023-07-07: 4 mg via INTRAVENOUS
  Filled 2023-07-07: qty 2

## 2023-07-07 MED ORDER — CLOPIDOGREL BISULFATE 75 MG PO TABS
75.0000 mg | ORAL_TABLET | Freq: Every day | ORAL | Status: DC
  Administered 2023-07-07 – 2023-07-10 (×4): 75 mg via ORAL
  Filled 2023-07-07 (×4): qty 1

## 2023-07-07 MED ORDER — SODIUM CHLORIDE 0.9 % IV SOLN
1.0000 g | Freq: Once | INTRAVENOUS | Status: AC
  Administered 2023-07-07: 1 g via INTRAVENOUS
  Filled 2023-07-07: qty 10

## 2023-07-07 MED ORDER — ALBUTEROL SULFATE (2.5 MG/3ML) 0.083% IN NEBU: 2.5000 mg | INHALATION_SOLUTION | RESPIRATORY_TRACT | Status: DC | PRN

## 2023-07-07 MED ORDER — ASPIRIN 81 MG PO TBEC
81.0000 mg | DELAYED_RELEASE_TABLET | Freq: Every evening | ORAL | Status: DC
  Administered 2023-07-07 – 2023-07-09 (×3): 81 mg via ORAL
  Filled 2023-07-07 (×3): qty 1

## 2023-07-07 MED ORDER — SODIUM CHLORIDE 0.9 % IV SOLN
500.0000 mg | INTRAVENOUS | Status: DC
  Administered 2023-07-08 – 2023-07-09 (×2): 500 mg via INTRAVENOUS
  Filled 2023-07-07 (×3): qty 5

## 2023-07-07 MED ORDER — CALCIUM GLUCONATE 10 % IV SOLN
1.0000 g | Freq: Once | INTRAVENOUS | Status: AC
  Administered 2023-07-07: 1 g via INTRAVENOUS
  Filled 2023-07-07: qty 10

## 2023-07-07 MED ORDER — AZITHROMYCIN 250 MG PO TABS
500.0000 mg | ORAL_TABLET | Freq: Once | ORAL | Status: AC
  Administered 2023-07-07: 500 mg via ORAL
  Filled 2023-07-07: qty 2

## 2023-07-07 MED ORDER — FAMOTIDINE IN NACL 20-0.9 MG/50ML-% IV SOLN
20.0000 mg | Freq: Once | INTRAVENOUS | Status: AC
  Administered 2023-07-07: 20 mg via INTRAVENOUS
  Filled 2023-07-07: qty 50

## 2023-07-07 MED ORDER — DEXTROSE 50 % IV SOLN
1.0000 | Freq: Once | INTRAVENOUS | Status: AC
Start: 2023-07-07 — End: 2023-07-07
  Administered 2023-07-07: 50 mL via INTRAVENOUS
  Filled 2023-07-07: qty 50

## 2023-07-07 MED ORDER — ONDANSETRON HCL 4 MG/2ML IJ SOLN: 4.0000 mg | Freq: Four times a day (QID) | INTRAMUSCULAR | Status: DC | PRN

## 2023-07-07 MED ORDER — ACETAMINOPHEN 325 MG PO TABS
650.0000 mg | ORAL_TABLET | Freq: Four times a day (QID) | ORAL | Status: DC | PRN
Start: 2023-07-07 — End: 2023-07-10

## 2023-07-07 MED ORDER — OXYCODONE-ACETAMINOPHEN 5-325 MG PO TABS
1.0000 | ORAL_TABLET | Freq: Two times a day (BID) | ORAL | Status: DC | PRN
  Administered 2023-07-08 – 2023-07-09 (×3): 1 via ORAL
  Filled 2023-07-07 (×3): qty 1

## 2023-07-07 MED ORDER — OXYCODONE-ACETAMINOPHEN 10-325 MG PO TABS: 1.0000 | ORAL_TABLET | Freq: Two times a day (BID) | ORAL | Status: DC | PRN

## 2023-07-07 MED ORDER — BUDESON-GLYCOPYRROL-FORMOTEROL 160-9-4.8 MCG/ACT IN AERO
2.0000 | INHALATION_SPRAY | Freq: Two times a day (BID) | RESPIRATORY_TRACT | Status: DC
  Administered 2023-07-07 – 2023-07-10 (×6): 2 via RESPIRATORY_TRACT
  Filled 2023-07-07: qty 5.9

## 2023-07-07 MED ORDER — ONDANSETRON HCL 4 MG PO TABS: 4.0000 mg | ORAL_TABLET | Freq: Four times a day (QID) | ORAL | Status: DC | PRN

## 2023-07-07 MED ORDER — SACCHAROMYCES BOULARDII 250 MG PO CAPS
250.0000 mg | ORAL_CAPSULE | Freq: Every day | ORAL | Status: DC
  Administered 2023-07-07 – 2023-07-10 (×4): 250 mg via ORAL
  Filled 2023-07-07 (×5): qty 1

## 2023-07-07 MED ORDER — FLUTICASONE PROPIONATE 50 MCG/ACT NA SUSP: 1.0000 | Freq: Every day | NASAL | Status: DC | PRN

## 2023-07-07 MED ORDER — ACETAMINOPHEN 650 MG RE SUPP: 650.0000 mg | Freq: Four times a day (QID) | RECTAL | Status: DC | PRN

## 2023-07-07 MED ORDER — PANTOPRAZOLE SODIUM 40 MG PO TBEC
40.0000 mg | DELAYED_RELEASE_TABLET | Freq: Every day | ORAL | Status: DC
  Administered 2023-07-07 – 2023-07-10 (×4): 40 mg via ORAL
  Filled 2023-07-07 (×4): qty 1

## 2023-07-07 MED ORDER — SODIUM ZIRCONIUM CYCLOSILICATE 10 G PO PACK
10.0000 g | PACK | Freq: Once | ORAL | Status: AC
  Administered 2023-07-07: 10 g via ORAL
  Filled 2023-07-07: qty 1

## 2023-07-07 MED ORDER — OXYCODONE HCL 5 MG PO TABS
5.0000 mg | ORAL_TABLET | Freq: Two times a day (BID) | ORAL | Status: DC | PRN
  Administered 2023-07-08 – 2023-07-09 (×2): 5 mg via ORAL
  Filled 2023-07-07 (×2): qty 1

## 2023-07-07 MED ORDER — VENLAFAXINE HCL 75 MG PO TABS
75.0000 mg | ORAL_TABLET | Freq: Every day | ORAL | Status: DC
  Administered 2023-07-07 – 2023-07-10 (×4): 75 mg via ORAL
  Filled 2023-07-07 (×5): qty 1

## 2023-07-07 MED ORDER — LACTATED RINGERS IV BOLUS
1000.0000 mL | Freq: Once | INTRAVENOUS | Status: AC
  Administered 2023-07-07: 1000 mL via INTRAVENOUS

## 2023-07-07 MED ORDER — HEPARIN SODIUM (PORCINE) 5000 UNIT/ML IJ SOLN
5000.0000 [IU] | Freq: Three times a day (TID) | INTRAMUSCULAR | Status: DC
  Administered 2023-07-07 – 2023-07-10 (×8): 5000 [IU] via SUBCUTANEOUS
  Filled 2023-07-07 (×8): qty 1

## 2023-07-07 MED ORDER — PSYLLIUM 95 % PO PACK
1.0000 | PACK | Freq: Every day | ORAL | Status: DC
  Administered 2023-07-08 – 2023-07-10 (×2): 1 via ORAL
  Filled 2023-07-07 (×2): qty 1

## 2023-07-07 MED ORDER — SODIUM CHLORIDE 0.9 % IV SOLN
1.0000 g | INTRAVENOUS | Status: DC
  Administered 2023-07-08 – 2023-07-09 (×2): 1 g via INTRAVENOUS
  Filled 2023-07-07 (×2): qty 10

## 2023-07-07 MED ORDER — PSYLLIUM 51.7 % PO PACK: PACK | Freq: Every day | ORAL | Status: DC

## 2023-07-07 MED ORDER — SODIUM CHLORIDE 0.9 % IV SOLN: INTRAVENOUS | Status: AC

## 2023-07-07 MED ORDER — ROPINIROLE HCL 1 MG PO TABS
2.0000 mg | ORAL_TABLET | Freq: Every day | ORAL | Status: DC
  Administered 2023-07-07 – 2023-07-09 (×3): 2 mg via ORAL
  Filled 2023-07-07 (×3): qty 2

## 2023-07-07 MED ORDER — ROSUVASTATIN CALCIUM 10 MG PO TABS
10.0000 mg | ORAL_TABLET | Freq: Every day | ORAL | Status: DC
  Administered 2023-07-07 – 2023-07-10 (×4): 10 mg via ORAL
  Filled 2023-07-07 (×4): qty 1

## 2023-07-07 MED ORDER — LOPERAMIDE HCL 2 MG PO CAPS: 2.0000 mg | ORAL_CAPSULE | ORAL | Status: DC | PRN

## 2023-07-07 NOTE — ED Notes (Addendum)
 Pt has been changed into pajamas provided by family and repositioned in the bed. Pts dentures have been placed inside a pink denture container on the table inside room. Pt has a set of keys inside his shoe that is in a separate belongings bag from the one with his clothes.

## 2023-07-07 NOTE — ED Provider Notes (Signed)
 [place] EMERGENCY DEPARTMENT AT [address] [facility] Provider Note   CSN: [id number] Arrival date & time: 07/07/23  8295     Patient presents with: Back Pain, Diarrhea, and Nausea   [name] is a 83 y.o. male.   Patient is an 83 year old male with past medical history of prior kidney and bladder cancer status post right nephrectomy, CAD status post CABG, diastolic CHF, GERD, chronic back pain on home Percocet presenting to the emergency department with weakness and diarrhea.  Patient is here with his daughter who helps provide history.  Reports that over the last week he has had worsening weakness where he has been hardly able to get up and walk.  She states that he has had a decreased appetite and has had significant diarrhea the last several days.  Did see his PCP on Monday and had labs and his kidney function was worsening.  She states that he did take Imodium last night and he states he has not had diarrhea since then but was previously having it 4-5 times a day.  He reports associated abdominal pain.  Does state that he has chronic abdominal pain related to his GERD but it does seem worse with the recent illness as well as worsening of his chronic back pain.  His daughter reports when she checked in on him on Sunday he was having severe pain wrapping around from his lower abdomen into his low back.  He denies any new numbness or tingling in his legs, dysuria or hematuria.  Reports he has chronic rectal pain that he has previously seen GI for.  Also states that he has been having chronic daily headaches for several months that he was told was related to his sinuses but states he has never had head CT imaging.  The history is provided by the patient and a relative.  Back Pain Diarrhea      Prior to Admission medications   Medication Sig Start Date End Date Taking? Authorizing Provider  acetaminophen  (TYLENOL ) 500 MG tablet Take 1,000 mg by mouth every 6 (six) hours as needed  for moderate pain.    [provider]  amLODipine  (NORVASC ) 5 MG tablet TAKE 1 TABLET BY MOUTH DAILY 11/12/22   Zilphia Hilt, Charyl Coppersmith, MD  aspirin  EC 81 MG tablet Take 81 mg by mouth every evening. Swallow whole.    [provider]  cetirizine (ZYRTEC) 10 MG tablet Take 10 mg by mouth daily as needed for allergies.    [provider]  clopidogrel  (PLAVIX ) 75 MG tablet TAKE 1 TABLET BY MOUTH ONCE  DAILY 03/11/23   Millicent Ally, MD  Coenzyme Q10 200 MG capsule Take 200 mg by mouth daily.    [provider]  cyanocobalamin  (VITAMIN B12) 1000 MCG/ML injection INJECT 1 ML SUBCUTANEOUSLY ONCE EVERY MONTH 05/31/23   Zilphia Hilt, Charyl Coppersmith, MD  doxycycline  (VIBRA -TABS) 100 MG tablet Take 1 tablet (100 mg total) by mouth 2 (two) times daily. 03/16/23   Santina Cull R, PA-C  ezetimibe  (ZETIA ) 10 MG tablet TAKE 1 TABLET BY MOUTH DAILY 01/11/23   Zilphia Hilt, Charyl Coppersmith, MD  Fe Bisgly-Vit C-Vit B12-FA (GENTLE IRON PO) Take by mouth. daily    [provider]  fluticasone  (FLONASE ) 50 MCG/ACT nasal spray Place 1 spray into both nostrils daily as needed for allergies.    [provider]  Fluticasone -Umeclidin-Vilant (TRELEGY ELLIPTA ) 200-62.5-25 MCG/ACT AEPB Inhale 1 puff into the lungs daily. 05/19/23   Hunsucker, Zoila Hines  R, MD  Fluticasone -Umeclidin-Vilant (TRELEGY ELLIPTA ) 200-62.5-25 MCG/ACT AEPB Inhale 1 puff into the lungs daily. 05/19/23   Hunsucker, Archer Kobs, MD  isosorbide  mononitrate (IMDUR ) 60 MG 24 hr tablet TAKE 1 TABLET BY MOUTH DAILY 03/11/23   Millicent Ally, MD  Menthol, Topical Analgesic, (BENGAY EX) Apply 1 application topically daily as needed (pain).    [provider]  METAMUCIL FIBER PO Take 1 capsule by mouth in the morning.    [provider]  Multiple Vitamin (MULTIVITAMIN) tablet Take 1 tablet by mouth daily.    [provider]  Naphazoline-Pheniramine (OPCON-A) 0.027-0.315 % SOLN Place 1 drop  into both eyes daily as needed (itching eyes).    [provider]  nebivolol  (BYSTOLIC ) 10 MG tablet TAKE 1 TABLET BY MOUTH DAILY 05/11/23   Millicent Ally, MD  NEEDLE, DISP, 25 G (B-D DISP NEEDLE 25GX1) 25G X 1 MISC Inject 1000 mcg into muscle once a month. 12/24/17   Zilphia Hilt, Charyl Coppersmith, MD  nitroGLYCERIN  (NITROSTAT ) 0.4 MG SL tablet PLACE 1 TABLET UNDER THE TONGUE EVERY 5 MINUTES AS NEEDED FOR CHEST PAIN 07/06/23   Zilphia Hilt, Charyl Coppersmith, MD  NON FORMULARY Diltiazem 2%/Lidocaine5% compound Use 3 x rectally daily for 2 months to heal anal fissure 12/28/22   Edmonia Gottron, PA-C  oxyCODONE -acetaminophen  (PERCOCET) 10-325 MG tablet Take 1 tablet by mouth 2 (two) times daily. 05/12/23   Zilphia Hilt, Charyl Coppersmith, MD  oxyCODONE -acetaminophen  (PERCOCET) 10-325 MG tablet Take 1 tablet by mouth 2 (two) times daily. 05/12/23   Zilphia Hilt, Charyl Coppersmith, MD  oxyCODONE -acetaminophen  (PERCOCET) 10-325 MG tablet Take 1 tablet by mouth 2 (two) times daily. 05/12/23   Zilphia Hilt, Charyl Coppersmith, MD  pantoprazole  (PROTONIX ) 40 MG tablet TAKE 1 TABLET BY MOUTH ONCE  DAILY 10/30/22   Kennedy-Smith, Colleen M, NP  polyethylene glycol powder (GLYCOLAX/MIRALAX) 17 GM/SCOOP powder Take 1 Container by mouth daily. As needed    [provider]  prednisoLONE acetate (PRED FORTE) 1 % ophthalmic suspension Place 1 drop into both eyes in the morning, at noon, and at bedtime. 03/12/23   [provider]  rOPINIRole  (REQUIP ) 2 MG tablet TAKE 1 TABLET BY MOUTH AT  BEDTIME 05/24/23   Zilphia Hilt, Charyl Coppersmith, MD  rosuvastatin  (CRESTOR ) 10 MG tablet TAKE 1 TABLET BY MOUTH DAILY 05/11/23   Millicent Ally, MD  saccharomyces boulardii (FLORASTOR) 250 MG capsule Take 250 mg by mouth 2 (two) times daily.    [provider]  venlafaxine  (EFFEXOR ) 75 MG tablet TAKE 1 TABLET BY MOUTH DAILY 06/16/23   Zilphia Hilt, Charyl Coppersmith, MD    Allergies: Patient has no known allergies.    Review of  Systems  Gastrointestinal:  Positive for diarrhea.  Musculoskeletal:  Positive for back pain.    Updated Vital Signs BP 109/62   Pulse 65   Temp 97.6 F (36.4 C)   Resp 16   Ht 5' 9 (1.753 m)   Wt 68 kg   SpO2 98%   BMI 22.14 kg/m   Physical Exam Vitals and nursing note reviewed.  Constitutional:      General: He is not in acute distress.    Appearance: Normal appearance.  HENT:     Head: Normocephalic and atraumatic.     Nose: Nose normal.     Mouth/Throat:     Mouth: Mucous membranes are dry.     Pharynx: Oropharynx is clear.   Eyes:     Extraocular Movements:  Extraocular movements intact.     Conjunctiva/sclera: Conjunctivae normal.    Cardiovascular:     Rate and Rhythm: Normal rate and regular rhythm.     Heart sounds: Normal heart sounds.  Pulmonary:     Effort: Pulmonary effort is normal.     Breath sounds: Normal breath sounds.  Abdominal:     General: Abdomen is flat.     Palpations: Abdomen is soft.     Tenderness: There is abdominal tenderness (mild epigastric). There is no right CVA tenderness or left CVA tenderness.   Musculoskeletal:        General: Normal range of motion.     Cervical back: Normal range of motion and neck supple.     Comments: No reproducible midline back tenderness   Skin:    General: Skin is warm and dry.   Neurological:     General: No focal deficit present.     Mental Status: He is alert and oriented to person, place, and time.     Sensory: No sensory deficit.     Motor: No weakness.   Psychiatric:        Mood and Affect: Mood normal.        Behavior: Behavior normal.     (all labs ordered are listed, but only abnormal results are displayed) Labs Reviewed  COMPREHENSIVE METABOLIC PANEL WITH GFR - Abnormal; Notable for the following components:      Result Value   Potassium 6.3 (*)    Glucose, Bld 109 (*)    BUN 73 (*)    Creatinine, Ser 3.76 (*)    Calcium  8.4 (*)    Albumin  3.3 (*)    AST 82 (*)    GFR,  Estimated 15 (*)    All other components within normal limits  LIPASE, BLOOD - Abnormal; Notable for the following components:   Lipase 71 (*)    All other components within normal limits  CBC WITH DIFFERENTIAL/PLATELET - Abnormal; Notable for the following components:   Platelets 103 (*)    All other components within normal limits  MAGNESIUM  - Abnormal; Notable for the following components:   Magnesium  1.5 (*)    All other components within normal limits  C DIFFICILE QUICK SCREEN W PCR REFLEX    GASTROINTESTINAL PANEL BY PCR, STOOL (REPLACES STOOL CULTURE)  URINALYSIS, W/ REFLEX TO CULTURE (INFECTION SUSPECTED)  CBG MONITORING, ED    EKG: EKG Interpretation Date/Time:  Wednesday July 07 2023 13:31:52 EDT Ventricular Rate:  65 PR Interval:  164 QRS Duration:  97 QT Interval:  417 QTC Calculation: 434 R Axis:   47  Text Interpretation: Sinus rhythm RSR' in V1 or V2, probably normal variant Mildly peaked T-waves compared to prior EKG Confirmed by Celesta Coke ([id number]) on 07/07/2023 1:52:34 PM  Radiology: CT L-SPINE NO CHARGE Result Date: 07/07/2023 CLINICAL DATA:  Chronic low back pain EXAM: CT LUMBAR SPINE WITHOUT CONTRAST TECHNIQUE: Multidetector CT imaging of the lumbar spine was performed without intravenous contrast administration. Multiplanar CT image reconstructions were also generated. RADIATION DOSE REDUCTION: This exam was performed according to the departmental dose-optimization program which includes automated exposure control, adjustment of the mA and/or kV according to patient size and/or use of iterative reconstruction technique. COMPARISON:  01/01/2023 FINDINGS: Segmentation: 5 non rib-bearing lumbar segments assigned L1-L5. Alignment: Normal Vertebrae: No acute fracture or focal lesion. Chronic endplate deformities L1-2, stable since previous. Paraspinal and other soft tissues: Aortic Atherosclerosis (ICD10-170.0). Left urolithiasis. Post right nephrectomy. Disc  levels:  Exuberant anterior endplate spurring W09-W1. Chronic moderate narrowing of the L4-5 and L5-S1 interspaces with small endplate spurs. Post left laminotomy L4-5. IMPRESSION: 1. No acute findings. 2. Chronic degenerative and postoperative changes as above. 3. Left urolithiasis. 4.  Aortic Atherosclerosis (ICD10-I70.0). Electronically Signed   By: Nicoletta Barrier M.D.   On: 07/07/2023 14:31   CT ABDOMEN PELVIS WO CONTRAST Result Date: 07/07/2023 CLINICAL DATA:  Abdominal pain, acute, nonlocalized EXAM: CT ABDOMEN AND PELVIS WITHOUT CONTRAST TECHNIQUE: Multidetector CT imaging of the abdomen and pelvis was performed following the standard protocol without IV contrast. RADIATION DOSE REDUCTION: This exam was performed according to the departmental dose-optimization program which includes automated exposure control, adjustment of the mA and/or kV according to patient size and/or use of iterative reconstruction technique. COMPARISON:  01/01/2023 FINDINGS: Lower chest: Chronic pleural thickening at the left lung base. Pleural based focal airspace consolidation in the anteromedial right middle lobe, new since previous. Hepatobiliary: Post cholecystectomy. Pneumobilia implying patent sphincterotomy. No new liver lesion or biliary ductal dilatation. Pancreas: Unremarkable. No pancreatic ductal dilatation or surrounding inflammatory changes. Spleen: Normal in size without focal abnormality. Adrenals/Urinary Tract: No adrenal mass. Post right nephrectomy. Left kidney is normal in contour. 2 mm calcification peripherally in the mid renal collecting system. No hydronephrosis. Urinary bladder incompletely distended. Stomach/Bowel: stomach is partially distended, without acute finding. Small bowel decompressed. Normal appendix. Colon is partially distended, without acute finding. Vascular/Lymphatic: Heavy calcified aortoiliac atheromatous plaque without aneurysm. Circumaortic left renal vein, anatomic variant. No abdominal or  pelvic adenopathy. Reproductive: Penile prosthesis components as before. Prostate unremarkable. Other: No ascites.  No free air. Musculoskeletal: Stable lumbar spondylitic changes. Bilateral hip DJD right worse than left. IMPRESSION: 1. No acute findings. 2. Right middle lobe pneumonia. 3. Post right nephrectomy. 4.  Aortic Atherosclerosis (ICD10-I70.0). Electronically Signed   By: Nicoletta Barrier M.D.   On: 07/07/2023 14:27   DG Chest Port 1 View Result Date: 07/07/2023 CLINICAL DATA:  Shortness of breath EXAM: PORTABLE CHEST 1 VIEW COMPARISON:  Chest x-ray 06/03/2020. FINDINGS: Sternal wires. Stable cardiopericardial silhouette. Fixation hardware along the lower cervical spine. Blunting of the left costophrenic angle is stable from previous. Tiny effusion and or pleural thickening. Mild linear opacity left lung base as well as stable. No pneumothorax or new consolidation. No edema. Degenerative changes along the spine. IMPRESSION: No significant will change when adjusted for technique. Electronically Signed   By: Adrianna Horde M.D.   On: 07/07/2023 12:35   CT Head Wo Contrast Result Date: 07/07/2023 CLINICAL DATA:  Headache, new onset. EXAM: CT HEAD WITHOUT CONTRAST TECHNIQUE: Contiguous axial images were obtained from the base of the skull through the vertex without intravenous contrast. RADIATION DOSE REDUCTION: This exam was performed according to the departmental dose-optimization program which includes automated exposure control, adjustment of the mA and/or kV according to patient size and/or use of iterative reconstruction technique. COMPARISON:  None Available. FINDINGS: Brain: No acute intracranial hemorrhage. No CT evidence of acute infarct. Nonspecific hypoattenuation in the periventricular and subcortical white matter favored to reflect chronic microvascular ischemic changes. No edema, mass effect, or midline shift. The basilar cisterns are patent. Ventricles: The ventricles are normal. Vascular:  Atherosclerotic calcifications of the carotid siphons and intracranial vertebral arteries. No hyperdense vessel. Skull: No acute or aggressive finding. Orbits: Orbits are symmetric. Sinuses: The visualized paranasal sinuses are clear. Other: Mastoid air cells are clear. IMPRESSION: No CT evidence of acute intracranial abnormality. Mild chronic microvascular ischemic changes. Electronically Signed   By:  Denny Flack M.D.   On: 07/07/2023 12:04     .Critical Care  Performed by: Kingsley, Nerida Boivin K, DO Authorized by: Nolberto Batty, DO   Critical care provider statement:    Critical care time (minutes):  30   Critical care was necessary to treat or prevent imminent or life-threatening deterioration of the following conditions:  Renal failure   Critical care was time spent personally by me on the following activities:  Development of treatment plan with patient or surrogate, discussions with consultants, evaluation of patient's response to treatment, examination of patient, ordering and review of laboratory studies, ordering and review of radiographic studies, ordering and performing treatments and interventions, pulse oximetry, re-evaluation of patient's condition and review of old charts    Medications Ordered in the ED  cefTRIAXone  (ROCEPHIN ) 1 g in sodium chloride  0.9 % 100 mL IVPB (1 g Intravenous New Bag/Given 07/07/23 1505)  lactated ringers  bolus 1,000 mL (0 mLs Intravenous Stopped 07/07/23 1233)  famotidine  (PEPCID ) IVPB 20 mg premix (0 mg Intravenous Stopped 07/07/23 1233)  ondansetron  (ZOFRAN ) injection 4 mg (4 mg Intravenous Given 07/07/23 1054)  insulin  aspart (novoLOG ) injection 5 Units (5 Units Intravenous Given 07/07/23 1442)    And  dextrose  50 % solution 50 mL (50 mLs Intravenous Given 07/07/23 1456)  sodium zirconium cyclosilicate (LOKELMA) packet 10 g (10 g Oral Given 07/07/23 1442)  calcium  gluconate inj 10% (1 g) URGENT USE ONLY! (1 g Intravenous Given 07/07/23 1445)   azithromycin  (ZITHROMAX ) tablet 500 mg (500 mg Oral Given 07/07/23 1503)    Clinical Course as of 07/07/23 1519  Wed Jul 07, 2023  1119 Mild thrombocytopenia on labs. [VK]  1222 No acute disease on CTH. [VK]  1246 K 6.3, will have EKG and need repletion. Cr worsening from baseline and labs by PCP on Monday. CT read is pending.  [VK]  1353 Mild peaked t-waves on EKG. Will be given calcium , insulin , dextrose  and lokelma.  [VK]  1436 No acute abnormality on CTAP with R middle lobe pneumonia. Will be started on antibiotics and will be admitted. [VK]    Clinical Course User Index [VK] Kingsley, Garth Diffley K, DO                                 Medical Decision Making This patient presents to the ED with chief complaint(s) of weakness, diarrhea with pertinent past medical history of CAD s/p CABG, prior bladder/kidney cancer s/p nephrectomy, CKD, GERD, chronic back pain which further complicates the presenting complaint. The complaint involves an extensive differential diagnosis and also carries with it a high risk of complications and morbidity.    The differential diagnosis includes dehydration, electrolyte abnormality, gastroenteritis, colitis, intra-abdominal infection, no focal neurologic deficits making CVA or TIA unlikely, considering possible mass effect with chronic headaches  Additional history obtained: Additional history obtained from family Records reviewed Primary Care Documents and outpatient GI records  ED Course and Reassessment: On patient's arrival he is hemodynamically stable in no acute distress.  Does seem to have acute worsening of multiple chronic problems I am concerned for dehydration.  Patient will of labs and imaging performed, will be started on fluids and will be given Pepcid  and Zofran , declined any additional pain control at this time, will be closely reassessed.  Independent labs interpretation:  The following labs were independently interpreted: AKI on CKD,  hyperkalemia, mild hypomag and mildly elevated lipase  Independent visualization  of imaging: - I independently visualized the following imaging with scope of interpretation limited to determining acute life threatening conditions related to emergency care: CTH, CTAP, CT lumbar spine, CXR, which revealed RML pneumonia, otherwise no acute findings  Consultation: - Consulted or discussed management/test interpretation w/ external professional: hospitalist  Consideration for admission or further workup: patient requires admission for AKI on CKD with hyperkalemia and pneumonia Social Determinants of health: N/A    Amount and/or Complexity of Data Reviewed Labs: ordered. Radiology: ordered.  Risk OTC drugs. Prescription drug management. Decision regarding hospitalization.       Final diagnoses:  Hyperkalemia  AKI (acute kidney injury) (HCC)  Generalized weakness  Pneumonia of right middle lobe due to infectious organism  Diarrhea, unspecified type    ED Discharge Orders     None          Kingsley, Sanaai Doane K, DO 07/07/23 1519

## 2023-07-07 NOTE — H&P (Signed)
 History and Physical  [name] WUJ:[id number] DOB: 01/22/1940 DOA: 07/07/2023  PCP: Zilphia Hilt, Charyl Coppersmith, MD   Chief Complaint: Nausea, diarrhea  HPI: [name] is a 83 y.o. male with medical history significant for COPD on room air, depression, hypertension, hyperlipidemia, esophageal stenosis, renal cell carcinoma status post nephrectomy being admitted to the hospital with acute kidney injury in the setting of diarrhea.  Patient states he does not have chronic diarrhea, started having watery diarrhea up to 7 times a day about 1 week ago.  He has some associated subjective fevers and chills, and some nausea without vomiting.  He denies any new medications, or any antibiotics in the last 6 months.  No sick contacts.  He was seen by his PCP a couple days ago, was noted to have acute on chronic kidney disease, was started on Imodium and since yesterday has not had any more diarrhea.  However he continued to still feel very weak and so he came to the ER for evaluation, was found to have worsening AKI.  Review of Systems: Please see HPI for pertinent positives and negatives. A complete 10 system review of systems are otherwise negative.  Past Medical History:  Diagnosis Date   Allergy     Anxiety    B12 deficiency anemia    Blood transfusion without reported diagnosis    CAD (coronary artery disease)    Cancer (HCC)    bladder, Right renal cell also   Chronic kidney disease    Colon polyps    COPD (chronic obstructive pulmonary disease) (HCC)    Depression    Diabetes mellitus (HCC)    Dyspnea    Esophagus, Barrett's    GERD (gastroesophageal reflux disease)    Headache    History of bladder cancer    Bladder cancer 8 times   History of hiatal hernia    Hyperlipidemia    Hypertension    Localized osteoarthrosis, lower leg    Myocardial infarction (HCC) 2021   Pneumonia    Restless leg syndrome    Sleep apnea    does not wear cpap   Stenosis of esophagus     Past Surgical History:  Procedure Laterality Date   BILIARY BRUSHING  04/01/2018   Procedure: BILIARY BRUSHING;  Surgeon: Normie Becton., MD;  Location: [facility] ENDOSCOPY;  Service: Gastroenterology;;   BILIARY BRUSHING  09/12/2018   Procedure: BILIARY BRUSHING;  Surgeon: Normie Becton., MD;  Location: [facility] ENDOSCOPY;  Service: Gastroenterology;;   BILIARY BRUSHING  11/28/2018   Procedure: BILIARY BRUSHING;  Surgeon: Normie Becton., MD;  Location: [facility] ENDOSCOPY;  Service: Gastroenterology;;   BILIARY BRUSHING  03/11/2020   Procedure: BILIARY BRUSHING;  Surgeon: Normie Becton., MD;  Location: [facility] ENDOSCOPY;  Service: Gastroenterology;;   BILIARY DILATION  09/12/2018   Procedure: BILIARY DILATION;  Surgeon: Normie Becton., MD;  Location: [facility] ENDOSCOPY;  Service: Gastroenterology;;   BILIARY DILATION  11/28/2018   Procedure: BILIARY DILATION;  Surgeon: Normie Becton., MD;  Location: [facility] ENDOSCOPY;  Service: Gastroenterology;;   BILIARY DILATION  03/11/2020   Procedure: BILIARY DILATION;  Surgeon: Normie Becton., MD;  Location: [facility] ENDOSCOPY;  Service: Gastroenterology;;   BILIARY STENT PLACEMENT  04/01/2018   Procedure: BILIARY STENT PLACEMENT;  Surgeon: Normie Becton., MD;  Location: [facility] ENDOSCOPY;  Service: Gastroenterology;;   BILIARY STENT PLACEMENT  09/12/2018   Procedure: BILIARY STENT PLACEMENT;  Surgeon: Normie Becton., MD;  Location: [facility] ENDOSCOPY;  Service: Gastroenterology;;  BILIARY STENT PLACEMENT  11/28/2018   Procedure: BILIARY STENT PLACEMENT;  Surgeon: Brice Campi Albino Alu., MD;  Location: [facility] ENDOSCOPY;  Service: Gastroenterology;;   BIOPSY  04/01/2018   Procedure: BIOPSY;  Surgeon: Normie Becton., MD;  Location: [facility] ENDOSCOPY;  Service: Gastroenterology;;   BIOPSY  09/12/2018   Procedure: BIOPSY;  Surgeon: Normie Becton., MD;  Location: [facility] ENDOSCOPY;  Service: Gastroenterology;;    BIOPSY  11/28/2018   Procedure: BIOPSY;  Surgeon: Normie Becton., MD;  Location: [facility] ENDOSCOPY;  Service: Gastroenterology;;   BIOPSY  03/11/2020   Procedure: BIOPSY;  Surgeon: Normie Becton., MD;  Location: [facility] ENDOSCOPY;  Service: Gastroenterology;;   bladder cancer      x 8 cystoscopy   CERVICAL DISCECTOMY     ACDF   CHOLECYSTECTOMY     COLONOSCOPY  11/17/2005   normal    CORONARY ARTERY BYPASS GRAFT     x4   CORONARY STENT INTERVENTION N/A 01/05/2019   Procedure: CORONARY STENT INTERVENTION;  Surgeon: Sammy Crisp, MD;  Location: [facility] INVASIVE CV LAB;  Service: Cardiovascular;  Laterality: N/A;   CYSTOSCOPY WITH BIOPSY N/A 03/27/2022   Procedure: CYSTOSCOPY WITH BIOPSY;  Surgeon: Homero Luster, MD;  Location: [facility] ORS;  Service: Urology;  Laterality: N/A;   CYSTOSCOPY WITH RETROGRADE PYELOGRAM, URETEROSCOPY AND STENT PLACEMENT Right 03/27/2022   Procedure: CYSTOSCOPY WITH RIGHT RETROGRADE PYELOGRAM, URETEROSCOPY AND STENT PLACEMENT urethral dilation;  Surgeon: Homero Luster, MD;  Location: [facility] ORS;  Service: Urology;  Laterality: Right;   ENDOSCOPIC MUCOSAL RESECTION  09/12/2018   Procedure: ENDOSCOPIC MUCOSAL RESECTION;  Surgeon: Brice Campi Albino Alu., MD;  Location: [facility] ENDOSCOPY;  Service: Gastroenterology;;   ENDOSCOPIC RETROGRADE CHOLANGIOPANCREATOGRAPHY (ERCP) WITH PROPOFOL  N/A 04/01/2018   Procedure: ENDOSCOPIC RETROGRADE CHOLANGIOPANCREATOGRAPHY (ERCP) WITH PROPOFOL ;  Surgeon: Normie Becton., MD;  Location: [facility] ENDOSCOPY;  Service: Gastroenterology;  Laterality: N/A;   ENDOSCOPIC RETROGRADE CHOLANGIOPANCREATOGRAPHY (ERCP) WITH PROPOFOL  N/A 09/12/2018   Procedure: ENDOSCOPIC RETROGRADE CHOLANGIOPANCREATOGRAPHY (ERCP) WITH PROPOFOL ;  Surgeon: Brice Campi Albino Alu., MD;  Location: [facility] ENDOSCOPY;  Service: Gastroenterology;  Laterality: N/A;   ENDOSCOPIC RETROGRADE CHOLANGIOPANCREATOGRAPHY (ERCP) WITH PROPOFOL  N/A 03/11/2020   Procedure: ENDOSCOPIC RETROGRADE  CHOLANGIOPANCREATOGRAPHY (ERCP) WITH PROPOFOL ;  Surgeon: Brice Campi Albino Alu., MD;  Location: [facility] ENDOSCOPY;  Service: Gastroenterology;  Laterality: N/A;   ENDOSCOPIC RETROGRADE CHOLANGIOPANCREATOGRAPHY (ERCP) WITH PROPOFOL  N/A 06/03/2020   Procedure: ENDOSCOPIC RETROGRADE CHOLANGIOPANCREATOGRAPHY (ERCP) WITH PROPOFOL ;  Surgeon: Brice Campi Albino Alu., MD;  Location: [facility] ENDOSCOPY;  Service: Gastroenterology;  Laterality: N/A;   ERCP N/A 11/28/2018   Procedure: ENDOSCOPIC RETROGRADE CHOLANGIOPANCREATOGRAPHY (ERCP) +EGD with spyglass;  Surgeon: Brice Campi Albino Alu., MD;  Location: [facility] ENDOSCOPY;  Service: Gastroenterology;  Laterality: N/A;   ESOPHAGOGASTRODUODENOSCOPY  04/29/2010   ESOPHAGOGASTRODUODENOSCOPY (EGD) WITH PROPOFOL  N/A 04/01/2018   Procedure: ESOPHAGOGASTRODUODENOSCOPY (EGD) WITH PROPOFOL ;  Surgeon: Brice Campi Albino Alu., MD;  Location: [facility] ENDOSCOPY;  Service: Gastroenterology;  Laterality: N/A;   ESOPHAGOGASTRODUODENOSCOPY (EGD) WITH PROPOFOL  N/A 09/12/2018   Procedure: ESOPHAGOGASTRODUODENOSCOPY (EGD) WITH PROPOFOL ;  Surgeon: Brice Campi Albino Alu., MD;  Location: [facility] ENDOSCOPY;  Service: Gastroenterology;  Laterality: N/A;   ESOPHAGOGASTRODUODENOSCOPY (EGD) WITH PROPOFOL  N/A 11/28/2018   Procedure: ESOPHAGOGASTRODUODENOSCOPY (EGD) WITH PROPOFOL ;  Surgeon: Brice Campi Albino Alu., MD;  Location: [facility] ENDOSCOPY;  Service: Gastroenterology;  Laterality: N/A;   ESOPHAGOGASTRODUODENOSCOPY (EGD) WITH PROPOFOL  N/A 03/11/2020   Procedure: ESOPHAGOGASTRODUODENOSCOPY (EGD) WITH PROPOFOL ;  Surgeon: Brice Campi Albino Alu., MD;  Location: [facility] ENDOSCOPY;  Service: Gastroenterology;  Laterality: N/A;   ESOPHAGOGASTRODUODENOSCOPY (EGD) WITH PROPOFOL  N/A 06/03/2020   Procedure: ESOPHAGOGASTRODUODENOSCOPY (EGD) WITH PROPOFOL ;  Surgeon: Normie Becton., MD;  Location: [facility] ENDOSCOPY;  Service: Gastroenterology;  Laterality: N/A;   EUS  04/01/2018   Procedure: FULL UPPER ENDOSCOPIC ULTRASOUND  (EUS) RADIAL;  Surgeon: Normie Becton., MD;  Location: [facility] ENDOSCOPY;  Service: Gastroenterology;;   EUS N/A 09/12/2018   Procedure: UPPER ENDOSCOPIC ULTRASOUND (EUS) RADIAL;  Surgeon: Normie Becton., MD;  Location: [facility] ENDOSCOPY;  Service: Gastroenterology;  Laterality: N/A;   FINE NEEDLE ASPIRATION  09/12/2018   Procedure: FINE NEEDLE ASPIRATION (FNA) LINEAR;  Surgeon: Normie Becton., MD;  Location: [facility] ENDOSCOPY;  Service: Gastroenterology;;   HAND SURGERY Left 2018   saw accident   HEMOSTASIS CLIP PLACEMENT  09/12/2018   Procedure: HEMOSTASIS CLIP PLACEMENT;  Surgeon: Normie Becton., MD;  Location: [facility] ENDOSCOPY;  Service: Gastroenterology;;   HEMOSTASIS CLIP PLACEMENT  06/03/2020   Procedure: HEMOSTASIS CLIP PLACEMENT;  Surgeon: Normie Becton., MD;  Location: [facility] ENDOSCOPY;  Service: Gastroenterology;;   I & D EXTREMITY Left 11/24/2016   Procedure: IRRIGATION AND DEBRIDEMENT LEFT HAND, THUMB, INDEX, MIDDLE, RING, AND SMALL FINGERS WITH RECONSTRUCTION;  Surgeon: Ronn Cohn, MD;  Location: [facility] OR;  Service: Orthopedics;  Laterality: Left;   KNEE ARTHROSCOPY Left    LEFT HEART CATH AND CORS/GRAFTS ANGIOGRAPHY N/A 01/05/2019   Procedure: LEFT HEART CATH AND CORS/GRAFTS ANGIOGRAPHY;  Surgeon: Sammy Crisp, MD;  Location: [facility] INVASIVE CV LAB;  Service: Cardiovascular;  Laterality: N/A;   LUMBAR LAMINECTOMY     and fusion x 2   NASAL SINUS SURGERY     POPLITEAL SYNOVIAL CYST EXCISION     REMOVAL OF STONES  04/01/2018   Procedure: REMOVAL OF STONES;  Surgeon: Brice Campi Albino Alu., MD;  Location: [facility] ENDOSCOPY;  Service: Gastroenterology;;   REMOVAL OF STONES  09/12/2018   Procedure: REMOVAL OF STONES;  Surgeon: Normie Becton., MD;  Location: [facility] ENDOSCOPY;  Service: Gastroenterology;;   REMOVAL OF STONES  11/28/2018   Procedure: REMOVAL OF STONES;  Surgeon: Normie Becton., MD;  Location: [facility] ENDOSCOPY;  Service: Gastroenterology;;    REMOVAL OF STONES  03/11/2020   Procedure: REMOVAL OF STONES;  Surgeon: Normie Becton., MD;  Location: [facility]OSCOPY;  Service: Gastroenterology;;   REMOVAL OF STONES  06/03/2020   Procedure: REMOVAL OF STONES;  Surgeon: Normie Becton., MD;  Location: [facility] ENDOSCOPY;  Service: Gastroenterology;;   ROBOT ASSITED LAPAROSCOPIC NEPHROURETERECTOMY Right 06/03/2022   Procedure: XI ROBOT ASSITED LAPAROSCOPIC NEPHROURETERECTOMY, FLEXIBLE CYSTOSCOPY;  Surgeon: Melody Spurling., MD;  Location: [facility] ORS;  Service: Urology;  Laterality: Right;  3 HRS   SAVORY DILATION N/A 09/12/2018   Procedure: SAVORY DILATION;  Surgeon: Brice Campi Albino Alu., MD;  Location: [facility] ENDOSCOPY;  Service: Gastroenterology;  Laterality: N/A;   SAVORY DILATION N/A 11/28/2018   Procedure: SAVORY DILATION;  Surgeon: Brice Campi Albino Alu., MD;  Location: [facility] ENDOSCOPY;  Service: Gastroenterology;  Laterality: N/A;   SEPTOPLASTY Bilateral 05/26/2021   Procedure: SEPTOPLASTY;  Surgeon: Reynold Caves, MD;  Location: [facility];  Service: ENT;  Laterality: Bilateral;   SPHINCTEROTOMY  04/01/2018   Procedure: [address];  Surgeon: Mansouraty, Albino Alu., MD;  Location: [facility] ENDOSCOPY;  Service: Gastroenterology;;   Alberteen Aloe CHOLANGIOSCOPY N/A 11/28/2018   Procedure: ZOXWRUEA CHOLANGIOSCOPY;  Surgeon: Normie Becton., MD;  Location: [facility] ENDOSCOPY;  Service: Gastroenterology;  Laterality: N/A;   SPYGLASS CHOLANGIOSCOPY N/A 06/03/2020   Procedure: SPYGLASS CHOLANGIOSCOPY;  Surgeon: Normie Becton., MD;  Location: [facility] ENDOSCOPY;  Service: Gastroenterology;  Laterality: N/A;   STENT REMOVAL  09/12/2018   Procedure: STENT REMOVAL;  Surgeon: Normie Becton., MD;  Location: [facility] ENDOSCOPY;  Service: Gastroenterology;;   Yuvonne Herald REMOVAL  11/28/2018   Procedure: STENT REMOVAL;  Surgeon: Normie Becton., MD;  Location: [facility] ENDOSCOPY;  Service: Gastroenterology;;   Yuvonne Herald REMOVAL  03/11/2020    Procedure: STENT REMOVAL;  Surgeon: Normie Becton., MD;  Location: [facility] ENDOSCOPY;  Service: Gastroenterology;;   Jona Negro INJECTION  09/12/2018   Procedure: SUBMUCOSAL LIFTING INJECTION;  Surgeon: Normie Becton., MD;  Location: [facility] ENDOSCOPY;  Service: Gastroenterology;;   Social History:  reports that he quit smoking about 47 years ago. His smoking use included cigarettes. He started smoking about 52 years ago. He has a 2.5 pack-year smoking history. He has never used smokeless tobacco. He reports that he does not drink alcohol and does not use drugs.  No Known Allergies  Family History  Problem Relation Age of Onset   Melanoma Mother    Stroke Father    Hypertension Father    Coronary artery disease Other    Colon cancer Neg Hx    Esophageal cancer Neg Hx    Stomach cancer Neg Hx    Rectal cancer Neg Hx    Pancreatic cancer Neg Hx    Liver disease Neg Hx    Inflammatory bowel disease Neg Hx      Prior to Admission medications   Medication Sig Start Date End Date Taking? Authorizing Provider  acetaminophen  (TYLENOL ) 500 MG tablet Take 1,000 mg by mouth every 6 (six) hours as needed for moderate pain.   Yes [provider]  amLODipine  (NORVASC ) 5 MG tablet TAKE 1 TABLET BY MOUTH DAILY 11/12/22  Yes Zilphia Hilt, Charyl Coppersmith, MD  aspirin  EC 81 MG tablet Take 81 mg by mouth every evening. Swallow whole.   Yes [provider]  cetirizine (ZYRTEC) 10 MG tablet Take 10 mg by mouth daily as needed for allergies.   Yes [provider]  clopidogrel  (PLAVIX ) 75 MG tablet TAKE 1 TABLET BY MOUTH ONCE  DAILY 03/11/23  Yes Millicent Ally, MD  Coenzyme Q10 200 MG capsule Take 200 mg by mouth daily.   Yes [provider]  cyanocobalamin  (VITAMIN B12) 1000 MCG/ML injection INJECT 1 ML SUBCUTANEOUSLY ONCE EVERY MONTH 05/31/23  Yes Zilphia Hilt, Charyl Coppersmith, MD  ezetimibe  (ZETIA ) 10 MG tablet TAKE 1 TABLET BY MOUTH DAILY 01/11/23  Yes  Zilphia Hilt, Charyl Coppersmith, MD  Fe Bisgly-Vit C-Vit B12-FA (GENTLE IRON PO) Take 1 tablet by mouth daily.   Yes [provider]  fluticasone  (FLONASE ) 50 MCG/ACT nasal spray Place 1 spray into both nostrils daily as needed for allergies.   Yes [provider]  Fluticasone -Umeclidin-Vilant (TRELEGY ELLIPTA ) 200-62.5-25 MCG/ACT AEPB Inhale 1 puff into the lungs daily. 05/19/23  Yes Hunsucker, Archer Kobs, MD  isosorbide  mononitrate (IMDUR ) 60 MG 24 hr tablet TAKE 1 TABLET BY MOUTH DAILY 03/11/23  Yes Millicent Ally, MD  Menthol, Topical Analgesic, (BENGAY EX) Apply 1 application topically daily as needed (pain).   Yes [provider]  METAMUCIL FIBER PO Take 1 capsule by mouth in the morning.   Yes [provider]  Multiple Vitamin (MULTIVITAMIN) tablet Take 1 tablet by mouth daily.   Yes [provider]  Naphazoline-Pheniramine (OPCON-A) 0.027-0.315 % SOLN Place 1 drop into both eyes daily as needed (itching eyes).   Yes [provider]  nebivolol  (BYSTOLIC ) 10 MG tablet TAKE 1 TABLET BY MOUTH DAILY 05/11/23  Yes Millicent Ally, MD  nitroGLYCERIN  (NITROSTAT ) 0.4 MG SL tablet PLACE 1 TABLET UNDER THE TONGUE EVERY 5 MINUTES AS NEEDED FOR CHEST PAIN 07/06/23  Yes Zilphia Hilt, Charyl Coppersmith, MD  oxyCODONE -acetaminophen  (PERCOCET) 10-325 MG tablet Take 1 tablet by mouth 2 (two) times daily. 05/12/23  Yes Zilphia Hilt, Charyl Coppersmith, MD  pantoprazole  (PROTONIX ) 40 MG tablet TAKE 1 TABLET BY MOUTH ONCE  DAILY 10/30/22  Yes Kennedy-Smith, Colleen M, NP  polyethylene glycol powder (GLYCOLAX/MIRALAX) 17 GM/SCOOP powder Take 1 Container by mouth daily. As needed   Yes [provider]  rOPINIRole  (REQUIP ) 2 MG tablet TAKE 1 TABLET BY MOUTH AT  BEDTIME 05/24/23  Yes Zilphia Hilt, Charyl Coppersmith, MD  rosuvastatin  (CRESTOR ) 10 MG tablet TAKE 1 TABLET BY MOUTH DAILY 05/11/23  Yes Millicent Ally, MD  saccharomyces boulardii (FLORASTOR) 250 MG capsule Take 250 mg by  mouth daily.   Yes [provider]  venlafaxine  (EFFEXOR ) 75 MG tablet TAKE 1 TABLET BY MOUTH DAILY 06/16/23  Yes Zilphia Hilt, Charyl Coppersmith, MD  NEEDLE, DISP, 25 G (B-D DISP NEEDLE 25GX1) 25G X 1 MISC Inject 1000 mcg into muscle once a month. 12/24/17   Zilphia Hilt, Charyl Coppersmith, MD    Physical Exam: BP 109/62   Pulse 65   Temp 97.6 F (36.4 C)   Resp 16   Ht 5' 9 (1.753 m)   Wt 68 kg   SpO2 98%   BMI 22.14 kg/m  General:  Alert, oriented, calm, in no acute distress, pleasant and cooperative Cardiovascular: RRR, no murmurs or rubs, no peripheral edema  Respiratory: clear to auscultation bilaterally, no wheezes, no crackles  Abdomen: soft, nontender, nondistended, normal bowel tones heard  Skin: dry, no rashes  Musculoskeletal: no joint effusions, normal range of motion  Psychiatric: appropriate affect, normal speech  Neurologic: extraocular muscles intact, clear speech, moving all extremities with intact sensorium         Labs on Admission:  Basic Metabolic Panel: Recent Labs  Lab 07/05/23 1232 07/07/23 1058  NA 139 142  K 5.3 6.3*  CL 107 111  CO2 19* 22  GLUCOSE 95 109*  BUN 52* 73*  CREATININE 3.35* 3.76*  CALCIUM  8.5* 8.4*  MG  --  1.5*   Liver Function Tests: Recent Labs  Lab 07/07/23 1058  AST 82*  ALT 41  ALKPHOS 71  BILITOT 0.8  PROT 6.9  ALBUMIN  3.3*   Recent Labs  Lab 07/07/23 1058  LIPASE 71*   No results for input(s): AMMONIA in the last 168 hours. CBC: Recent Labs  Lab 07/07/23 1058  WBC 6.9  NEUTROABS 4.2  HGB 13.0  HCT 42.1  MCV 90.5  PLT 103*   Cardiac Enzymes: No results for input(s): CKTOTAL, CKMB, CKMBINDEX, TROPONINI in the last 168 hours. BNP (last 3 results) No results for input(s): BNP in the last 8760 hours.  ProBNP (last 3 results) No results for input(s): PROBNP in the last 8760 hours.  CBG: Recent Labs  Lab 07/07/23 1418  GLUCAP 85    Radiological Exams on Admission: CT L-SPINE  NO CHARGE Result Date: 07/07/2023 CLINICAL DATA:  Chronic low back pain EXAM: CT LUMBAR SPINE WITHOUT CONTRAST TECHNIQUE: Multidetector CT imaging of the lumbar spine was performed without intravenous contrast administration. Multiplanar CT image reconstructions were also generated. RADIATION DOSE REDUCTION: This exam was performed according to the departmental dose-optimization program which includes automated exposure control, adjustment of the mA and/or kV according to patient size and/or  use of iterative reconstruction technique. COMPARISON:  01/01/2023 FINDINGS: Segmentation: 5 non rib-bearing lumbar segments assigned L1-L5. Alignment: Normal Vertebrae: No acute fracture or focal lesion. Chronic endplate deformities L1-2, stable since previous. Paraspinal and other soft tissues: Aortic Atherosclerosis (ICD10-170.0). Left urolithiasis. Post right nephrectomy. Disc levels: Exuberant anterior endplate spurring G95-A2. Chronic moderate narrowing of the L4-5 and L5-S1 interspaces with small endplate spurs. Post left laminotomy L4-5. IMPRESSION: 1. No acute findings. 2. Chronic degenerative and postoperative changes as above. 3. Left urolithiasis. 4.  Aortic Atherosclerosis (ICD10-I70.0). Electronically Signed   By: Nicoletta Barrier M.D.   On: 07/07/2023 14:31   CT ABDOMEN PELVIS WO CONTRAST Result Date: 07/07/2023 CLINICAL DATA:  Abdominal pain, acute, nonlocalized EXAM: CT ABDOMEN AND PELVIS WITHOUT CONTRAST TECHNIQUE: Multidetector CT imaging of the abdomen and pelvis was performed following the standard protocol without IV contrast. RADIATION DOSE REDUCTION: This exam was performed according to the departmental dose-optimization program which includes automated exposure control, adjustment of the mA and/or kV according to patient size and/or use of iterative reconstruction technique. COMPARISON:  01/01/2023 FINDINGS: Lower chest: Chronic pleural thickening at the left lung base. Pleural based focal airspace  consolidation in the anteromedial right middle lobe, new since previous. Hepatobiliary: Post cholecystectomy. Pneumobilia implying patent sphincterotomy. No new liver lesion or biliary ductal dilatation. Pancreas: Unremarkable. No pancreatic ductal dilatation or surrounding inflammatory changes. Spleen: Normal in size without focal abnormality. Adrenals/Urinary Tract: No adrenal mass. Post right nephrectomy. Left kidney is normal in contour. 2 mm calcification peripherally in the mid renal collecting system. No hydronephrosis. Urinary bladder incompletely distended. Stomach/Bowel: stomach is partially distended, without acute finding. Small bowel decompressed. Normal appendix. Colon is partially distended, without acute finding. Vascular/Lymphatic: Heavy calcified aortoiliac atheromatous plaque without aneurysm. Circumaortic left renal vein, anatomic variant. No abdominal or pelvic adenopathy. Reproductive: Penile prosthesis components as before. Prostate unremarkable. Other: No ascites.  No free air. Musculoskeletal: Stable lumbar spondylitic changes. Bilateral hip DJD right worse than left. IMPRESSION: 1. No acute findings. 2. Right middle lobe pneumonia. 3. Post right nephrectomy. 4.  Aortic Atherosclerosis (ICD10-I70.0). Electronically Signed   By: Nicoletta Barrier M.D.   On: 07/07/2023 14:27   DG Chest Port 1 View Result Date: 07/07/2023 CLINICAL DATA:  Shortness of breath EXAM: PORTABLE CHEST 1 VIEW COMPARISON:  Chest x-ray 06/03/2020. FINDINGS: Sternal wires. Stable cardiopericardial silhouette. Fixation hardware along the lower cervical spine. Blunting of the left costophrenic angle is stable from previous. Tiny effusion and or pleural thickening. Mild linear opacity left lung base as well as stable. No pneumothorax or new consolidation. No edema. Degenerative changes along the spine. IMPRESSION: No significant will change when adjusted for technique. Electronically Signed   By: Adrianna Horde M.D.   On:  07/07/2023 12:35   CT Head Wo Contrast Result Date: 07/07/2023 CLINICAL DATA:  Headache, new onset. EXAM: CT HEAD WITHOUT CONTRAST TECHNIQUE: Contiguous axial images were obtained from the base of the skull through the vertex without intravenous contrast. RADIATION DOSE REDUCTION: This exam was performed according to the departmental dose-optimization program which includes automated exposure control, adjustment of the mA and/or kV according to patient size and/or use of iterative reconstruction technique. COMPARISON:  None Available. FINDINGS: Brain: No acute intracranial hemorrhage. No CT evidence of acute infarct. Nonspecific hypoattenuation in the periventricular and subcortical white matter favored to reflect chronic microvascular ischemic changes. No edema, mass effect, or midline shift. The basilar cisterns are patent. Ventricles: The ventricles are normal. Vascular: Atherosclerotic calcifications of the carotid siphons  and intracranial vertebral arteries. No hyperdense vessel. Skull: No acute or aggressive finding. Orbits: Orbits are symmetric. Sinuses: The visualized paranasal sinuses are clear. Other: Mastoid air cells are clear. IMPRESSION: No CT evidence of acute intracranial abnormality. Mild chronic microvascular ischemic changes. Electronically Signed   By: Denny Flack M.D.   On: 07/07/2023 12:04   Assessment/Plan [name] is a 83 y.o. male with medical history significant for COPD on room air, depression, hypertension, hyperlipidemia, esophageal stenosis, renal cell carcinoma status post nephrectomy being admitted to the hospital with acute kidney injury in the setting of diarrhea.  AKI superimposed on CKD stage III-baseline creatinine appears to be about 2.6.  Now has worsening renal function likely from ATN due to dehydration from his diarrhea. -Inpatient admission -IV fluids -Avoid nephrotoxins, and renally dose medications  Hyperkalemia-with minimal EKG changes, received  Lokelma, IV insulin /glucose in the emergency department.  Hyperkalemia due to AKI. -Recheck BMP in 3 hours  Diarrhea-unclear etiology, but seems to be improving.  CT abdomen pelvis without acute findings, patient hemodynamically stable without fever or leukocytosis.  No indication for antibiotics, no recent history of antibiotics. -Continue supportive care -Imodium as needed  Right middle lobe pneumonia-patient without fever, leukocytosis or hypoxia, though has had some dyspnea in the last few days.  Query whether he aspirated, though he has not vomited.  He does have a history of esophageal stricture and recent dysphagia. -Empiric IV azithromycin  and IV Rocephin   Hypertension-hold home medications due to relative hypotension  DVT prophylaxis: HepSQ     Code Status: Full Code  Consults called: None  Admission status: The appropriate patient status for this patient is INPATIENT. Inpatient status is judged to be reasonable and necessary in order to provide the required intensity of service to ensure the patient's safety. The patient's presenting symptoms, physical exam findings, and initial radiographic and laboratory data in the context of their chronic comorbidities is felt to place them at high risk for further clinical deterioration. Furthermore, it is not anticipated that the patient will be medically stable for discharge from the hospital within 2 midnights of admission.    I certify that at the point of admission it is my clinical judgment that the patient will require inpatient hospital care spanning beyond 2 midnights from the point of admission due to high intensity of service, high risk for further deterioration and high frequency of surveillance required  Time spent: 49 minutes  Atiana Levier Rickey Charm MD [facility] Pager [phone]  If 7PM-7AM, please contact night-coverage [email or web address] Password [facility]  07/07/2023, 3:59 PM

## 2023-07-07 NOTE — ED Triage Notes (Signed)
 Pt arrived reporting chronic lower back pain, states has taken meds, minimal relief. No new injury. Also reports diarrhea and weakness for 1 week, unsure about any blood in stool. Has establish G.I appt, unable to go to app

## 2023-07-08 ENCOUNTER — Encounter (HOSPITAL_COMMUNITY): Payer: Self-pay | Admitting: Internal Medicine

## 2023-07-08 DIAGNOSIS — N179 Acute kidney failure, unspecified: Secondary | ICD-10-CM | POA: Diagnosis not present

## 2023-07-08 LAB — C DIFFICILE QUICK SCREEN W PCR REFLEX
C Diff antigen: POSITIVE — AB
C Diff interpretation: DETECTED
C Diff toxin: POSITIVE — AB

## 2023-07-08 LAB — COMPREHENSIVE METABOLIC PANEL WITH GFR
ALT: 37 U/L (ref 0–44)
AST: 65 U/L — ABNORMAL HIGH (ref 15–41)
Albumin: 3.1 g/dL — ABNORMAL LOW (ref 3.5–5.0)
Alkaline Phosphatase: 69 U/L (ref 38–126)
Anion gap: 7 (ref 5–15)
BUN: 61 mg/dL — ABNORMAL HIGH (ref 8–23)
CO2: 21 mmol/L — ABNORMAL LOW (ref 22–32)
Calcium: 8 mg/dL — ABNORMAL LOW (ref 8.9–10.3)
Chloride: 114 mmol/L — ABNORMAL HIGH (ref 98–111)
Creatinine, Ser: 3.2 mg/dL — ABNORMAL HIGH (ref 0.61–1.24)
GFR, Estimated: 19 mL/min — ABNORMAL LOW (ref >60)
Glucose, Bld: 108 mg/dL — ABNORMAL HIGH (ref 70–99)
Potassium: 5.2 mmol/L — ABNORMAL HIGH (ref 3.5–5.1)
Sodium: 142 mmol/L (ref 135–145)
Total Bilirubin: 0.6 mg/dL (ref 0.0–1.2)
Total Protein: 6.3 g/dL — ABNORMAL LOW (ref 6.5–8.1)

## 2023-07-08 LAB — MAGNESIUM: Magnesium: 1.4 mg/dL — ABNORMAL LOW (ref 1.7–2.4)

## 2023-07-08 LAB — CBC WITH DIFFERENTIAL/PLATELET
Abs Immature Granulocytes: 0.02 10*3/uL (ref 0.00–0.07)
Basophils Absolute: 0 10*3/uL (ref 0.0–0.1)
Basophils Relative: 0 %
Eosinophils Absolute: 0.1 10*3/uL (ref 0.0–0.5)
Eosinophils Relative: 1 %
HCT: 39.9 % (ref 39.0–52.0)
Hemoglobin: 12.2 g/dL — ABNORMAL LOW (ref 13.0–17.0)
Immature Granulocytes: 0 %
Lymphocytes Relative: 29 %
Lymphs Abs: 1.8 10*3/uL (ref 0.7–4.0)
MCH: 28.2 pg (ref 26.0–34.0)
MCHC: 30.6 g/dL (ref 30.0–36.0)
MCV: 92.4 fL (ref 80.0–100.0)
Monocytes Absolute: 0.4 10*3/uL (ref 0.1–1.0)
Monocytes Relative: 6 %
Neutro Abs: 3.9 10*3/uL (ref 1.7–7.7)
Neutrophils Relative %: 64 %
Platelets: 101 10*3/uL — ABNORMAL LOW (ref 150–400)
RBC: 4.32 MIL/uL (ref 4.22–5.81)
RDW: 14 % (ref 11.5–15.5)
WBC: 6.2 10*3/uL (ref 4.0–10.5)
nRBC: 0 % (ref 0.0–0.2)

## 2023-07-08 LAB — GLUCOSE, CAPILLARY: Glucose-Capillary: 70 mg/dL (ref 70–99)

## 2023-07-08 LAB — POTASSIUM: Potassium: 4.8 mmol/L (ref 3.5–5.1)

## 2023-07-08 LAB — STREP PNEUMONIAE URINARY ANTIGEN: Strep Pneumo Urinary Antigen: NEGATIVE

## 2023-07-08 MED ORDER — NEBIVOLOL HCL 10 MG PO TABS
10.0000 mg | ORAL_TABLET | Freq: Every day | ORAL | Status: DC
  Administered 2023-07-08 – 2023-07-10 (×3): 10 mg via ORAL
  Filled 2023-07-08 (×3): qty 1

## 2023-07-08 MED ORDER — SODIUM CHLORIDE 0.9 % IV SOLN: INTRAVENOUS | Status: DC

## 2023-07-08 MED ORDER — SODIUM ZIRCONIUM CYCLOSILICATE 10 G PO PACK
10.0000 g | PACK | Freq: Once | ORAL | Status: AC
  Administered 2023-07-08: 10 g via ORAL
  Filled 2023-07-08: qty 1

## 2023-07-08 MED ORDER — ADULT MULTIVITAMIN W/MINERALS CH
1.0000 | ORAL_TABLET | Freq: Every day | ORAL | Status: DC
  Administered 2023-07-08 – 2023-07-10 (×3): 1 via ORAL
  Filled 2023-07-08 (×3): qty 1

## 2023-07-08 MED ORDER — EZETIMIBE 10 MG PO TABS
10.0000 mg | ORAL_TABLET | Freq: Every day | ORAL | Status: DC
  Administered 2023-07-08 – 2023-07-10 (×3): 10 mg via ORAL
  Filled 2023-07-08 (×3): qty 1

## 2023-07-08 MED ORDER — VANCOMYCIN HCL 125 MG PO CAPS
125.0000 mg | ORAL_CAPSULE | Freq: Four times a day (QID) | ORAL | Status: DC
  Administered 2023-07-08 – 2023-07-10 (×7): 125 mg via ORAL
  Filled 2023-07-08 (×10): qty 1

## 2023-07-08 MED ORDER — ENSURE PLUS HIGH PROTEIN PO LIQD
237.0000 mL | Freq: Two times a day (BID) | ORAL | Status: DC
  Administered 2023-07-08 – 2023-07-10 (×5): 237 mL via ORAL

## 2023-07-08 NOTE — TOC Initial Note (Signed)
 Transition of Care [facility]) - Initial/Assessment Note   Patient Details  Name: [name] MRN: [id number] Date of Birth: Aug 19, 1940  [facility] CM/SW Contact:    Zenon Hilda, LCSW Phone Number: 07/08/2023, 9:57 AM  Clinical Narrative: Patient is from home alone. Patient is currently on IV antibiotics. TOC following for possible discharge needs.  Expected Discharge Plan: Home/Self Care Barriers to Discharge: Continued Medical Work up  Expected Discharge Plan and Services In-house Referral: Clinical Social Work Living arrangements for the past 2 months: Single Family Home           DME Arranged: N/A DME Agency: NA  Prior Living Arrangements/Services Living arrangements for the past 2 months: Single Family Home Lives with:: Self Patient language and need for interpreter reviewed:: Yes Do you feel safe going back to the place where you live?: Yes      Need for Family Participation in Patient Care: No (Comment) Care giver support system in place?: Yes (comment) Criminal Activity/Legal Involvement Pertinent to Current Situation/Hospitalization: No - Comment as needed  Activities of Daily Living ADL Screening (condition at time of admission) Independently performs ADLs?: Yes (appropriate for developmental age) Is the patient deaf or have difficulty hearing?: No Does the patient have difficulty seeing, even when wearing glasses/contacts?: No Does the patient have difficulty concentrating, remembering, or making decisions?: No  Emotional Assessment Alcohol / Substance Use: Not Applicable Psych Involvement: No (comment)  Admission diagnosis:  Hyperkalemia [E87.5] Generalized weakness [R53.1] AKI (acute kidney injury) (HCC) [N17.9] Pneumonia of right middle lobe due to infectious organism [J18.9] Diarrhea, unspecified type [R19.7] Patient Active Problem List   Diagnosis Date Noted   AKI (acute kidney injury) (HCC) 07/07/2023   Cancer of renal pelvis, right  (HCC) 06/03/2022   Bloating 04/27/2020   Gas pain 04/27/2020   Abdominal cramping 04/27/2020   Functional abdominal pain syndrome 11/12/2019   Lower extremity edema 11/12/2019   Hyperlipidemia    History of cholecystectomy 01/19/2019   Chronic diarrhea 01/19/2019   Antiplatelet or antithrombotic long-term use 01/19/2019   Non-ST elevation (NSTEMI) myocardial infarction [facility])    Acute hypoxemic respiratory failure (HCC) 01/04/2019   Multifocal pneumonia 01/04/2019   Severe sepsis (HCC) 01/04/2019   Elevated troponin 01/04/2019   Acute on chronic diastolic (congestive) heart failure (HCC) 01/04/2019   Acute respiratory failure (HCC) 01/04/2019   elevated IgG4 11/01/2018   Choledocholithiasis 10/27/2018   Dilation of biliary tract 10/27/2018   Esophageal dysphagia 10/27/2018   Upper airway cough syndrome 07/28/2018   Pleural effusion on left 07/28/2018   History of pancreatitis 05/12/2018   Abnormal LFTs 05/12/2018   Abnormal findings on esophagogastroduodenoscopy (EGD) 05/12/2018   History of ERCP 05/12/2018   Anemia 05/12/2018   Biliary stricture    ESBL (extended spectrum beta-lactamase) producing bacteria infection 04/04/2018   Ascending cholangitis 04/01/2018   Diabetes mellitus type 2, noninsulin dependent (HCC) 03/31/2018   Pancreatitis 03/31/2018   Sepsis (HCC) 03/31/2018   Bacteremia due to Gram-negative bacteria 03/31/2018   Abdominal pain    Dilated bile duct    Acute biliary pancreatitis 03/30/2018   Leukocytosis 03/30/2018   CAD (coronary artery disease) 03/30/2018   Osteoarthritis of left knee 10/18/2017   Cervical radiculopathy 10/04/2017   Pain of left hand 07/19/2017   Pre-operative cardiovascular examination 06/16/2017   CKD (chronic kidney disease), stage III 06/16/2017   Carpal tunnel syndrome 05/27/2017   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 05/10/2017   Open fracture of base of middle phalanx of  finger 02/17/2017   Laceration of index finger  02/17/2017   Laceration of nail bed of finger 02/17/2017   Laceration of thumb 02/17/2017   Open fracture of distal phalanx of finger 02/17/2017   Open fractures of multiple sites of phalanx of left hand 11/24/2016   B12 deficiency 05/28/2015   GERD (gastroesophageal reflux disease) 11/16/2014   Hyperlipidemia LDL goal <70 11/21/2013   DOE (dyspnea on exertion) 11/21/2013   Other dysphagia 07/14/2013   History of esophageal stricture 07/14/2013   Hx of CABG 06/05/2013   Leg pain, bilateral 10/18/2012   Restless leg syndrome 06/21/2012   ULNAR NEUROPATHY, LEFT 08/05/2009   Urinary obstruction 10/19/2008   ACTINIC KERATOSIS 06/25/2008   DRY EYE SYNDROME 10/13/2007   CARCINOMA, BLADDER, HX OF 07/02/2007   BARRETTS ESOPHAGUS 05/23/2007   Hypothyroidism 04/08/2007   Other malaise and fatigue 04/08/2007   Essential hypertension 10/12/2006   ANEMIA, B12 DEFICIENCY 09/08/2006   Depression 07/29/2006   NEUROPATHY, IDIOPATHIC PERIPHERAL NEC 07/29/2006   Allergic rhinitis 07/29/2006   LOW BACK PAIN 07/29/2006   COLONIC POLYPS 11/04/2000   PCP:  Zilphia Hilt, Charyl Coppersmith, MD Pharmacy:   [facility] [address] [place] - [address] [place] [address] Phone: [phone] Fax: [phone]  [facility] [place] - [address] [place] [address] Phone: [phone] Fax: [phone]  Social Drivers of Health (SDOH) Social History: SDOH Screenings   Food Insecurity: No Food Insecurity (07/08/2023)  Housing: Low Risk  (07/08/2023)  Transportation Needs: No Transportation Needs (07/08/2023)  Utilities: Not At Risk (07/08/2023)  Depression (PHQ2-9): Low Risk  (05/11/2023)  Financial Resource Strain: Low Risk  (02/10/2023)  Physical Activity: Unknown (02/10/2023)  Recent Concern: Physical Activity - Inactive (02/10/2023)  Social Connections: Moderately Integrated (07/08/2023)   Stress: No Stress Concern Present (02/10/2023)  Tobacco Use: Medium Risk (07/08/2023)   SDOH Interventions:    Readmission Risk Interventions    07/08/2023    9:48 AM  Readmission Risk Prevention Plan  Transportation Screening Complete  HRI or Home Care Consult Complete  Social Work Consult for Recovery Care Planning/Counseling Complete  Palliative Care Screening Not Applicable  Medication Review Oceanographer) Complete

## 2023-07-08 NOTE — Progress Notes (Signed)
 Lab contacted this nurse with critical value of stool positive for Cdiff. Dr. Modena Andes notified.

## 2023-07-08 NOTE — Progress Notes (Addendum)
 PROGRESS NOTE    Jacob RICARDO  Hughes:[id number] DOB: September 16, 1940 DOA: 07/07/2023 PCP: Zilphia Hilt, Charyl Coppersmith, MD   Brief Narrative:  HPI: [name] is a 83 y.o. male with medical history significant for COPD on room air, depression, hypertension, hyperlipidemia, esophageal stenosis, renal cell carcinoma status post nephrectomy being admitted to the hospital with acute kidney injury in the setting of diarrhea.  Patient states he does not have chronic diarrhea, started having watery diarrhea up to 7 times a day about 1 week ago.  He has some associated subjective fevers and chills, and some nausea without vomiting.  He denies any new medications, or any antibiotics in the last 6 months.  No sick contacts.  He was seen by his PCP a couple days ago, was noted to have acute on chronic kidney disease, was started on Imodium and since yesterday has not had any more diarrhea.  However he continued to still feel very weak and so he came to the ER for evaluation, was found to have worsening AKI.   Assessment & Plan:   Principal Problem:   AKI (acute kidney injury) (HCC)  Diarrhea: Unclear etiology.  CT abdomen unremarkable.  No fever.  He has not had any bowel movement since admission.  GI pathogen panel and C. difficile pending.  Imodium has been ordered.    Addendum/update: Was informed by the nurse right now right now that patient was tested positive for C. difficile.  Will start patient on Flagyl , discontinue Imodium.  AKI superimposed on CKD stage IV (CKD stage III ruled out)-baseline creatinine appears to be about 2.6.  Now has worsening renal function likely from ATN due to dehydration from his diarrhea.  Creatinine upon arrival was 3.76 which has improved to 3.15.  Still not back to baseline.  Will resume IV hydration.  Avoid nephrotoxic agents.  Recheck labs in the morning.   Hyperkalemia-with minimal EKG changes, received Lokelma, IV insulin /glucose in the emergency department.   Hyperkalemia due to AKI.  Initially 6.3, now down to 5.3.  Will give him a dose of Lokelma.  Monitor on telemetry.  Repeat potassium later and tomorrow morning.   Right middle lobe pneumonia-patient without fever, leukocytosis or hypoxia, though has had some dyspnea in the last few days.  Query whether he aspirated, though he has not vomited.  He does have a history of esophageal stricture and recent dysphagia.  Also rhonchi on examination.  Continue Rocephin  and Zithromax .  I will check urine antigen for Legionella and streptococci and sputum culture.   Hypertension-blood pressure within normal limit.  Continue to hold antihypertensives for now.  CAD: Asymptomatic.  Cardiology's left heart catheterization note reviewed.  Per them, patient is supposed to be taking DAPT aspirin  and Plavix  indefinitely.  Resumed both of them.  Hyperlipidemia: Resume home medications.  Rectal pain: Patient complained to me that he has been having rectal pain for 3 to 4 years.  He said that he has asked several providers in the past prior to coming to the hospital but no one ever checked him.  I examined his rectum in presence of patient's primary nurse as a chaperone.  I could not find any abnormality.  I informed the patient that he may be feeling this way because of multiple episodes of watery diarrhea.  If he continues to feel that feeling despite of resolution of diarrhea then he should consider consulting with general surgery as outpatient.  DVT prophylaxis: heparin  injection 5,000 Units Start: 07/07/23 2200  Code Status: Full Code  Family Communication:  None present at bedside.  Plan of care discussed with patient in length and he/she verbalized understanding and agreed with it.  Status is: Inpatient Remains inpatient appropriate because: Still dehydrated, needs fluids.   Estimated body mass index is 22.14 kg/m as calculated from the following:   Height as of this encounter: 5' 9 (1.753 m).   Weight as  of this encounter: 68 kg.    Nutritional Assessment: Body mass index is 22.14 kg/m.Aaron Aas Seen by dietician.  I agree with the assessment and plan as outlined below: Nutrition Status:        . Skin Assessment: I have examined the patient's skin and I agree with the wound assessment as performed by the wound care RN as outlined below:    Consultants:  None  Procedures:  None  Antimicrobials:  Anti-infectives (From admission, onward)    Start     Dose/Rate Route Frequency Ordered Stop   07/08/23 1500  azithromycin  (ZITHROMAX ) 500 mg in sodium chloride  0.9 % 250 mL IVPB        500 mg 250 mL/hr over 60 Minutes Intravenous Every 24 hours 07/07/23 1559     07/08/23 1400  cefTRIAXone  (ROCEPHIN ) 1 g in sodium chloride  0.9 % 100 mL IVPB        1 g 200 mL/hr over 30 Minutes Intravenous Every 24 hours 07/07/23 1559     07/07/23 1445  cefTRIAXone  (ROCEPHIN ) 1 g in sodium chloride  0.9 % 100 mL IVPB        1 g 200 mL/hr over 30 Minutes Intravenous  Once 07/07/23 1436 07/07/23 1535   07/07/23 1445  azithromycin  (ZITHROMAX ) tablet 500 mg        500 mg Oral  Once 07/07/23 1436 07/07/23 1503         Subjective: Patient seen and examined.  He says that he feels a lot better.  No more diarrhea, nausea or vomiting.  Only complaint was rectal pain as mentioned above.  Denies any shortness of breath.  Objective: Vitals:   07/08/23 0100 07/08/23 0149 07/08/23 0746 07/08/23 0830  BP: 116/66 139/67 120/61   Pulse: 62 70 65   Resp: 18 19 16    Temp:  97.7 F (36.5 C) 98.2 F (36.8 C)   TempSrc:  Oral Oral   SpO2: 99% 98% 96% 98%  Weight:      Height:        Intake/Output Summary (Last 24 hours) at 07/08/2023 1106 Last data filed at 07/07/2023 1535 Gross per 24 hour  Intake 100 ml  Output --  Net 100 ml   Filed Weights   07/07/23 1005  Weight: 68 kg    Examination:  General exam: Appears calm and comfortable  Respiratory system: Right middle lobe rhonchi. Respiratory effort  normal. Cardiovascular system: S1 & S2 heard, RRR. No JVD, murmurs, rubs, gallops or clicks. No pedal edema. Gastrointestinal system: Abdomen is nondistended, soft and nontender. No organomegaly or masses felt. Normal bowel sounds heard. Central nervous system: Alert and oriented. No focal neurological deficits. Extremities: Symmetric 5 x 5 power. Skin: No rashes, lesions or ulcers Psychiatry: Judgement and insight appear normal. Mood & affect appropriate.    Data Reviewed: I have personally reviewed following labs and imaging studies  CBC: Recent Labs  Lab 07/07/23 1058 07/08/23 1019  WBC 6.9 6.2  NEUTROABS 4.2 3.9  HGB 13.0 12.2*  HCT 42.1 39.9  MCV 90.5 92.4  PLT 103* 101*  Basic Metabolic Panel: Recent Labs  Lab 07/05/23 1232 07/07/23 1058 07/07/23 1932  NA 139 142 142  K 5.3 6.3* 5.3*  CL 107 111 112*  CO2 19* 22 22  GLUCOSE 95 109* 76  BUN 52* 73* 65*  CREATININE 3.35* 3.76* 3.15*  CALCIUM  8.5* 8.4* 7.5*  MG  --  1.5*  --    GFR: Estimated Creatinine Clearance: 17.7 mL/min (A) (by C-G formula based on SCr of 3.15 mg/dL (H)). Liver Function Tests: Recent Labs  Lab 07/07/23 1058  AST 82*  ALT 41  ALKPHOS 71  BILITOT 0.8  PROT 6.9  ALBUMIN  3.3*   Recent Labs  Lab 07/07/23 1058  LIPASE 71*   No results for input(s): AMMONIA in the last 168 hours. Coagulation Profile: No results for input(s): INR, PROTIME in the last 168 hours. Cardiac Enzymes: No results for input(s): CKTOTAL, CKMB, CKMBINDEX, TROPONINI in the last 168 hours. BNP (last 3 results) No results for input(s): PROBNP in the last 8760 hours. HbA1C: No results for input(s): HGBA1C in the last 72 hours. CBG: Recent Labs  Lab 07/07/23 1418 07/07/23 1659 07/08/23 0838  GLUCAP 85 112* 70   Lipid Profile: No results for input(s): CHOL, HDL, LDLCALC, TRIG, CHOLHDL, LDLDIRECT in the last 72 hours. Thyroid  Function Tests: No results for input(s): TSH,  T4TOTAL, FREET4, T3FREE, THYROIDAB in the last 72 hours. Anemia Panel: No results for input(s): VITAMINB12, FOLATE, FERRITIN, TIBC, IRON, RETICCTPCT in the last 72 hours. Sepsis Labs: No results for input(s): PROCALCITON, LATICACIDVEN in the last 168 hours.  No results found for this or any previous visit (from the past 240 hours).   Radiology Studies: CT L-SPINE NO CHARGE Result Date: 07/07/2023 CLINICAL DATA:  Chronic low back pain EXAM: CT LUMBAR SPINE WITHOUT CONTRAST TECHNIQUE: Multidetector CT imaging of the lumbar spine was performed without intravenous contrast administration. Multiplanar CT image reconstructions were also generated. RADIATION DOSE REDUCTION: This exam was performed according to the departmental dose-optimization program which includes automated exposure control, adjustment of the mA and/or kV according to patient size and/or use of iterative reconstruction technique. COMPARISON:  01/01/2023 FINDINGS: Segmentation: 5 non rib-bearing lumbar segments assigned L1-L5. Alignment: Normal Vertebrae: No acute fracture or focal lesion. Chronic endplate deformities L1-2, stable since previous. Paraspinal and other soft tissues: Aortic Atherosclerosis (ICD10-170.0). Left urolithiasis. Post right nephrectomy. Disc levels: Exuberant anterior endplate spurring Z61-W9. Chronic moderate narrowing of the L4-5 and L5-S1 interspaces with small endplate spurs. Post left laminotomy L4-5. IMPRESSION: 1. No acute findings. 2. Chronic degenerative and postoperative changes as above. 3. Left urolithiasis. 4.  Aortic Atherosclerosis (ICD10-I70.0). Electronically Signed   By: Nicoletta Barrier M.D.   On: 07/07/2023 14:31   CT ABDOMEN PELVIS WO CONTRAST Result Date: 07/07/2023 CLINICAL DATA:  Abdominal pain, acute, nonlocalized EXAM: CT ABDOMEN AND PELVIS WITHOUT CONTRAST TECHNIQUE: Multidetector CT imaging of the abdomen and pelvis was performed following the standard protocol without IV  contrast. RADIATION DOSE REDUCTION: This exam was performed according to the departmental dose-optimization program which includes automated exposure control, adjustment of the mA and/or kV according to patient size and/or use of iterative reconstruction technique. COMPARISON:  01/01/2023 FINDINGS: Lower chest: Chronic pleural thickening at the left lung base. Pleural based focal airspace consolidation in the anteromedial right middle lobe, new since previous. Hepatobiliary: Post cholecystectomy. Pneumobilia implying patent sphincterotomy. No new liver lesion or biliary ductal dilatation. Pancreas: Unremarkable. No pancreatic ductal dilatation or surrounding inflammatory changes. Spleen: Normal in size without focal abnormality. Adrenals/Urinary Tract:  No adrenal mass. Post right nephrectomy. Left kidney is normal in contour. 2 mm calcification peripherally in the mid renal collecting system. No hydronephrosis. Urinary bladder incompletely distended. Stomach/Bowel: stomach is partially distended, without acute finding. Small bowel decompressed. Normal appendix. Colon is partially distended, without acute finding. Vascular/Lymphatic: Heavy calcified aortoiliac atheromatous plaque without aneurysm. Circumaortic left renal vein, anatomic variant. No abdominal or pelvic adenopathy. Reproductive: Penile prosthesis components as before. Prostate unremarkable. Other: No ascites.  No free air. Musculoskeletal: Stable lumbar spondylitic changes. Bilateral hip DJD right worse than left. IMPRESSION: 1. No acute findings. 2. Right middle lobe pneumonia. 3. Post right nephrectomy. 4.  Aortic Atherosclerosis (ICD10-I70.0). Electronically Signed   By: Nicoletta Barrier M.D.   On: 07/07/2023 14:27   DG Chest Port 1 View Result Date: 07/07/2023 CLINICAL DATA:  Shortness of breath EXAM: PORTABLE CHEST 1 VIEW COMPARISON:  Chest x-ray 06/03/2020. FINDINGS: Sternal wires. Stable cardiopericardial silhouette. Fixation hardware along the  lower cervical spine. Blunting of the left costophrenic angle is stable from previous. Tiny effusion and or pleural thickening. Mild linear opacity left lung base as well as stable. No pneumothorax or new consolidation. No edema. Degenerative changes along the spine. IMPRESSION: No significant will change when adjusted for technique. Electronically Signed   By: Adrianna Horde M.D.   On: 07/07/2023 12:35   CT Head Wo Contrast Result Date: 07/07/2023 CLINICAL DATA:  Headache, new onset. EXAM: CT HEAD WITHOUT CONTRAST TECHNIQUE: Contiguous axial images were obtained from the base of the skull through the vertex without intravenous contrast. RADIATION DOSE REDUCTION: This exam was performed according to the departmental dose-optimization program which includes automated exposure control, adjustment of the mA and/or kV according to patient size and/or use of iterative reconstruction technique. COMPARISON:  None Available. FINDINGS: Brain: No acute intracranial hemorrhage. No CT evidence of acute infarct. Nonspecific hypoattenuation in the periventricular and subcortical white matter favored to reflect chronic microvascular ischemic changes. No edema, mass effect, or midline shift. The basilar cisterns are patent. Ventricles: The ventricles are normal. Vascular: Atherosclerotic calcifications of the carotid siphons and intracranial vertebral arteries. No hyperdense vessel. Skull: No acute or aggressive finding. Orbits: Orbits are symmetric. Sinuses: The visualized paranasal sinuses are clear. Other: Mastoid air cells are clear. IMPRESSION: No CT evidence of acute intracranial abnormality. Mild chronic microvascular ischemic changes. Electronically Signed   By: Denny Flack M.D.   On: 07/07/2023 12:04    Scheduled Meds:  aspirin  EC  81 mg Oral QPM   budesonide-glycopyrrolate -formoterol  2 puff Inhalation BID   clopidogrel   75 mg Oral Daily   feeding supplement  237 mL Oral BID BM   heparin   5,000 Units  Subcutaneous Q8H   pantoprazole   40 mg Oral Daily   psyllium  1 packet Oral Daily   rOPINIRole   2 mg Oral QHS   rosuvastatin   10 mg Oral Daily   saccharomyces boulardii  250 mg Oral Daily   venlafaxine   75 mg Oral Daily   Continuous Infusions:  sodium chloride  100 mL/hr at 07/08/23 1000   azithromycin      cefTRIAXone  (ROCEPHIN )  IV       LOS: 1 day   Modena Andes, MD [facility]  07/08/2023, 11:06 AM   *Please note that this is a verbal dictation therefore any spelling or grammatical errors are due to the Dragon [employer] One system interpretation.  Please page via Amion and do not message via secure chat for urgent patient care matters. Secure chat can be used  for non urgent patient care matters.  How to contact the [facility] Attending or Consulting provider 7A - 7P or covering provider during after hours 7P -7A, for this patient?  Check the care team in [facility] and look for a) attending/consulting [facility] provider listed and b) the [facility] team listed. Page or secure chat 7A-7P. Log into [email or web address] and use [place]'s universal password to access. If you do not have the password, please contact the hospital operator. Locate the [facility] provider you are looking for under [facility] and page to a number that you can be directly reached. If you still have difficulty reaching the provider, please page the [facility] (Director on Call) for the Hospitalists listed on amion for assistance.

## 2023-07-08 NOTE — Plan of Care (Signed)

## 2023-07-09 DIAGNOSIS — N179 Acute kidney failure, unspecified: Secondary | ICD-10-CM | POA: Diagnosis not present

## 2023-07-09 LAB — GASTROINTESTINAL PANEL BY PCR, STOOL (REPLACES STOOL CULTURE)

## 2023-07-09 LAB — BASIC METABOLIC PANEL WITH GFR
Anion gap: 9 (ref 5–15)
BUN: 54 mg/dL — ABNORMAL HIGH (ref 8–23)
CO2: 21 mmol/L — ABNORMAL LOW (ref 22–32)
Calcium: 7.6 mg/dL — ABNORMAL LOW (ref 8.9–10.3)
Chloride: 116 mmol/L — ABNORMAL HIGH (ref 98–111)
Creatinine, Ser: 3.14 mg/dL — ABNORMAL HIGH (ref 0.61–1.24)
GFR, Estimated: 19 mL/min — ABNORMAL LOW (ref >60)
Glucose, Bld: 82 mg/dL (ref 70–99)
Potassium: 5 mmol/L (ref 3.5–5.1)
Sodium: 146 mmol/L — ABNORMAL HIGH (ref 135–145)

## 2023-07-09 LAB — MAGNESIUM: Magnesium: 1.4 mg/dL — ABNORMAL LOW (ref 1.7–2.4)

## 2023-07-09 MED ORDER — MAGNESIUM SULFATE 4 GM/100ML IV SOLN
4.0000 g | Freq: Once | INTRAVENOUS | Status: AC
  Administered 2023-07-09: 4 g via INTRAVENOUS
  Filled 2023-07-09: qty 100

## 2023-07-09 MED ORDER — SODIUM CHLORIDE 0.9 % IV SOLN: INTRAVENOUS | Status: AC

## 2023-07-09 NOTE — Plan of Care (Signed)

## 2023-07-09 NOTE — Telephone Encounter (Signed)
Left message for patients daughter [name] to call back.

## 2023-07-09 NOTE — Progress Notes (Addendum)
 PROGRESS NOTE    OLEY LAHAIE  XBJ:[id number] DOB: Oct 31, 1940 DOA: 07/07/2023 PCP: Zilphia Hilt, Charyl Coppersmith, MD   Brief Narrative:  HPI: [name] is a 83 y.o. male with medical history significant for COPD on room air, depression, hypertension, hyperlipidemia, esophageal stenosis, renal cell carcinoma status post nephrectomy being admitted to the hospital with acute kidney injury in the setting of diarrhea.  Patient states he does not have chronic diarrhea, started having watery diarrhea up to 7 times a day about 1 week ago.  He has some associated subjective fevers and chills, and some nausea without vomiting.  He denies any new medications, or any antibiotics in the last 6 months.  No sick contacts.  He was seen by his PCP a couple days ago, was noted to have acute on chronic kidney disease, was started on Imodium and since yesterday has not had any more diarrhea.  However he continued to still feel very weak and so he came to the ER for evaluation, was found to have worsening AKI.   Assessment & Plan:   Principal Problem:   AKI (acute kidney injury) (HCC)  Diarrhea due to C. difficile infection and EPEC:CT abdomen unremarkable.  Tested positive for C. difficile as well as EPEC, and started on Flagyl .  Has had 4 watery but nonbloody bowel movements in the last 24 hours.  AKI superimposed on CKD stage IV (CKD stage III ruled out)-baseline creatinine appears to be about 2.6.  Creatinine upon arrival was 3.76 which has improved to 3.15.  Still not back to baseline.  Resume IV fluids for another 24 hours and recheck labs in the morning.   Hyperkalemia-with minimal EKG changes, received Lokelma, IV insulin /glucose in the emergency department.  Hyperkalemia due to AKI.  Initially 6.3, now down to 5.3.  Will give him a dose of Lokelma.  Monitor on telemetry.  Repeat potassium later and tomorrow morning.  Hypomagnesemia: Replenished.  Hyponatremia: Mild.  Asymptomatic.  Monitor.    Right middle lobe pneumonia-patient without fever, leukocytosis or hypoxia, though has had some dyspnea in the last few days.  Query whether he aspirated, though he has not vomited.  He does have a history of esophageal stricture and recent dysphagia.  Also rhonchi on examination.  Continue Rocephin  and Zithromax .  I will check urine antigen for Legionella and streptococci and sputum culture.   Hypertension-blood pressure within normal limit.  Continue to hold antihypertensives for now.  CAD: Asymptomatic.  Cardiology's left heart catheterization note reviewed.  Per them, patient is supposed to be taking DAPT aspirin  and Plavix  indefinitely.  Resumed both of them.  Hyperlipidemia: Continue statin.  Rectal pain: On 07/08/2023, patient complained to me that he has been having rectal pain for 3 to 4 years.  He said that he has asked several providers in the past prior to coming to the hospital but no one ever checked him.  I examined his rectum in presence of patient's primary nurse as a chaperone.  I could not find any abnormality.  I informed the patient that he may be feeling this way because of multiple episodes of watery diarrhea.  If he continues to feel that feeling despite of resolution of diarrhea then he should consider consulting with general surgery as outpatient.  DVT prophylaxis: heparin  injection 5,000 Units Start: 07/07/23 2200   Code Status: Full Code  Family Communication: Daughter present at bedside.  Plan of care discussed with patient in length and he/she verbalized understanding and agreed with  it.  Status is: Inpatient Remains inpatient appropriate because: Still dehydrated, with frequent bowel movements   Estimated body mass index is 22.14 kg/m as calculated from the following:   Height as of this encounter: 5' 9 (1.753 m).   Weight as of this encounter: 68 kg.    Nutritional Assessment: Body mass index is 22.14 kg/m.Aaron Aas Seen by dietician.  I agree with the assessment  and plan as outlined below: Nutrition Status:        . Skin Assessment: I have examined the patient's skin and I agree with the wound assessment as performed by the wound care RN as outlined below:    Consultants:  None  Procedures:  None  Antimicrobials:  Anti-infectives (From admission, onward)    Start     Dose/Rate Route Frequency Ordered Stop   07/08/23 1500  azithromycin  (ZITHROMAX ) 500 mg in sodium chloride  0.9 % 250 mL IVPB        500 mg 250 mL/hr over 60 Minutes Intravenous Every 24 hours 07/07/23 1559     07/08/23 1400  cefTRIAXone  (ROCEPHIN ) 1 g in sodium chloride  0.9 % 100 mL IVPB        1 g 200 mL/hr over 30 Minutes Intravenous Every 24 hours 07/07/23 1559     07/08/23 1400  vancomycin  (VANCOCIN ) capsule 125 mg        125 mg Oral 4 times daily 07/08/23 1120 07/18/23 1359   07/07/23 1445  cefTRIAXone  (ROCEPHIN ) 1 g in sodium chloride  0.9 % 100 mL IVPB        1 g 200 mL/hr over 30 Minutes Intravenous  Once 07/07/23 1436 07/07/23 1535   07/07/23 1445  azithromycin  (ZITHROMAX ) tablet 500 mg        500 mg Oral  Once 07/07/23 1436 07/07/23 1503         Subjective: Patient seen and examined, sitting in the recliner.  Daughter at the bedside.  Patient appears to be much more alert and oriented today.  He had no complaint.  He tells me that he has had 4 bowel movements in the last 24 hours however only 2 are documented.  Per him, all of them are watery.  I had a lengthy discussion with the patient about going home.  Patient as well as daughter both afraid of him being dehydrated again as he tends to not drink water  as much as he should and then he is also having diarrhea.  He prefers to stay overnight.  Objective: Vitals:   07/08/23 1151 07/08/23 1844 07/08/23 2204 07/09/23 0135  BP: 114/65  131/64 (!) 108/55  Pulse: 71  65 70  Resp: 16  18 20   Temp: 98 F (36.7 C)  98 F (36.7 C) 98.5 F (36.9 C)  TempSrc: Oral  Oral Oral  SpO2: 96% 98% 98% 98%  Weight:       Height:        Intake/Output Summary (Last 24 hours) at 07/09/2023 1151 Last data filed at 07/09/2023 0831 Gross per 24 hour  Intake 730.86 ml  Output 200 ml  Net 530.86 ml   Filed Weights   07/07/23 1005  Weight: 68 kg    Examination:  General exam: Appears calm and comfortable  Respiratory system: Right middle lobe rhonchi. Respiratory effort normal. Cardiovascular system: S1 & S2 heard, RRR. No JVD, murmurs, rubs, gallops or clicks. No pedal edema. Gastrointestinal system: Abdomen is nondistended, soft and nontender. No organomegaly or masses felt. Normal bowel sounds heard. Central nervous  system: Alert and oriented. No focal neurological deficits. Extremities: Symmetric 5 x 5 power. Skin: No rashes, lesions or ulcers Psychiatry: Judgement and insight appear normal. Mood & affect appropriate.    Data Reviewed: I have personally reviewed following labs and imaging studies  CBC: Recent Labs  Lab 07/07/23 1058 07/08/23 1019  WBC 6.9 6.2  NEUTROABS 4.2 3.9  HGB 13.0 12.2*  HCT 42.1 39.9  MCV 90.5 92.4  PLT 103* 101*   Basic Metabolic Panel: Recent Labs  Lab 07/05/23 1232 07/07/23 1058 07/07/23 1932 07/08/23 1019 07/08/23 1802 07/09/23 0342  NA 139 142 142 142  --  146*  K 5.3 6.3* 5.3* 5.2* 4.8 5.0  CL 107 111 112* 114*  --  116*  CO2 19* 22 22 21*  --  21*  GLUCOSE 95 109* 76 108*  --  82  BUN 52* 73* 65* 61*  --  54*  CREATININE 3.35* 3.76* 3.15* 3.20*  --  3.14*  CALCIUM  8.5* 8.4* 7.5* 8.0*  --  7.6*  MG  --  1.5*  --  1.4*  --  1.4*   GFR: Estimated Creatinine Clearance: 17.7 mL/min (A) (by C-G formula based on SCr of 3.14 mg/dL (H)). Liver Function Tests: Recent Labs  Lab 07/07/23 1058 07/08/23 1019  AST 82* 65*  ALT 41 37  ALKPHOS 71 69  BILITOT 0.8 0.6  PROT 6.9 6.3*  ALBUMIN  3.3* 3.1*   Recent Labs  Lab 07/07/23 1058  LIPASE 71*   No results for input(s): AMMONIA in the last 168 hours. Coagulation Profile: No results for  input(s): INR, PROTIME in the last 168 hours. Cardiac Enzymes: No results for input(s): CKTOTAL, CKMB, CKMBINDEX, TROPONINI in the last 168 hours. BNP (last 3 results) No results for input(s): PROBNP in the last 8760 hours. HbA1C: No results for input(s): HGBA1C in the last 72 hours. CBG: Recent Labs  Lab 07/07/23 1418 07/07/23 1659 07/08/23 0838  GLUCAP 85 112* 70   Lipid Profile: No results for input(s): CHOL, HDL, LDLCALC, TRIG, CHOLHDL, LDLDIRECT in the last 72 hours. Thyroid  Function Tests: No results for input(s): TSH, T4TOTAL, FREET4, T3FREE, THYROIDAB in the last 72 hours. Anemia Panel: No results for input(s): VITAMINB12, FOLATE, FERRITIN, TIBC, IRON, RETICCTPCT in the last 72 hours. Sepsis Labs: No results for input(s): PROCALCITON, LATICACIDVEN in the last 168 hours.  Recent Results (from the past 240 hours)  C Difficile Quick Screen w PCR reflex     Status: Abnormal   Collection Time: 07/07/23 10:35 AM   Specimen: STOOL  Result Value Ref Range Status   C Diff antigen POSITIVE (A) NEGATIVE Final   C Diff toxin POSITIVE (A) NEGATIVE Final   C Diff interpretation Toxin producing C. difficile detected.  Final    Comment: CRITICAL RESULT CALLED TO, READ BACK BY AND VERIFIED WITH: [place], T. 1119 07/08/23 by JE Performed at [facility], [address] [place], [place] [address]   Gastrointestinal Panel by PCR , Stool     Status: Abnormal   Collection Time: 07/07/23 10:35 AM   Specimen: STOOL  Result Value Ref Range Status   Campylobacter species NOT DETECTED NOT DETECTED Final   Plesimonas shigelloides NOT DETECTED NOT DETECTED Final   Salmonella species NOT DETECTED NOT DETECTED Final   Yersinia enterocolitica NOT DETECTED NOT DETECTED Final   Vibrio species NOT DETECTED NOT DETECTED Final   Vibrio cholerae NOT DETECTED NOT DETECTED Final   Enteroaggregative E coli (EAEC) NOT DETECTED NOT  DETECTED Final   Enteropathogenic E coli (EPEC) DETECTED (A) NOT DETECTED Final    Comment: RESULT CALLED TO, READ BACK BY AND VERIFIED WITH: Kathe Pallas @0002  on 07/09/23 skl    Enterotoxigenic E coli (ETEC) NOT DETECTED NOT DETECTED Final   Shiga like toxin producing E coli (STEC) NOT DETECTED NOT DETECTED Final   Shigella/Enteroinvasive E coli (EIEC) NOT DETECTED NOT DETECTED Final   Cryptosporidium NOT DETECTED NOT DETECTED Final   Cyclospora cayetanensis NOT DETECTED NOT DETECTED Final   Entamoeba histolytica NOT DETECTED NOT DETECTED Final   Giardia lamblia NOT DETECTED NOT DETECTED Final   Adenovirus F40/41 NOT DETECTED NOT DETECTED Final   Astrovirus NOT DETECTED NOT DETECTED Final   Norovirus GI/GII NOT DETECTED NOT DETECTED Final   Rotavirus A NOT DETECTED NOT DETECTED Final   Sapovirus (I, II, IV, and V) NOT DETECTED NOT DETECTED Final    Comment: Performed at [facility], [address] [place], [place] [address]     Radiology Studies: CT L-SPINE NO CHARGE Result Date: 07/07/2023 CLINICAL DATA:  Chronic low back pain EXAM: CT LUMBAR SPINE WITHOUT CONTRAST TECHNIQUE: Multidetector CT imaging of the lumbar spine was performed without intravenous contrast administration. Multiplanar CT image reconstructions were also generated. RADIATION DOSE REDUCTION: This exam was performed according to the departmental dose-optimization program which includes automated exposure control, adjustment of the mA and/or kV according to patient size and/or use of iterative reconstruction technique. COMPARISON:  01/01/2023 FINDINGS: Segmentation: 5 non rib-bearing lumbar segments assigned L1-L5. Alignment: Normal Vertebrae: No acute fracture or focal lesion. Chronic endplate deformities L1-2, stable since previous. Paraspinal and other soft tissues: Aortic Atherosclerosis (ICD10-170.0). Left urolithiasis. Post right nephrectomy. Disc levels: Exuberant anterior endplate spurring U04-V4.  Chronic moderate narrowing of the L4-5 and L5-S1 interspaces with small endplate spurs. Post left laminotomy L4-5. IMPRESSION: 1. No acute findings. 2. Chronic degenerative and postoperative changes as above. 3. Left urolithiasis. 4.  Aortic Atherosclerosis (ICD10-I70.0). Electronically Signed   By: Nicoletta Barrier M.D.   On: 07/07/2023 14:31   CT ABDOMEN PELVIS WO CONTRAST Result Date: 07/07/2023 CLINICAL DATA:  Abdominal pain, acute, nonlocalized EXAM: CT ABDOMEN AND PELVIS WITHOUT CONTRAST TECHNIQUE: Multidetector CT imaging of the abdomen and pelvis was performed following the standard protocol without IV contrast. RADIATION DOSE REDUCTION: This exam was performed according to the departmental dose-optimization program which includes automated exposure control, adjustment of the mA and/or kV according to patient size and/or use of iterative reconstruction technique. COMPARISON:  01/01/2023 FINDINGS: Lower chest: Chronic pleural thickening at the left lung base. Pleural based focal airspace consolidation in the anteromedial right middle lobe, new since previous. Hepatobiliary: Post cholecystectomy. Pneumobilia implying patent sphincterotomy. No new liver lesion or biliary ductal dilatation. Pancreas: Unremarkable. No pancreatic ductal dilatation or surrounding inflammatory changes. Spleen: Normal in size without focal abnormality. Adrenals/Urinary Tract: No adrenal mass. Post right nephrectomy. Left kidney is normal in contour. 2 mm calcification peripherally in the mid renal collecting system. No hydronephrosis. Urinary bladder incompletely distended. Stomach/Bowel: stomach is partially distended, without acute finding. Small bowel decompressed. Normal appendix. Colon is partially distended, without acute finding. Vascular/Lymphatic: Heavy calcified aortoiliac atheromatous plaque without aneurysm. Circumaortic left renal vein, anatomic variant. No abdominal or pelvic adenopathy. Reproductive: Penile prosthesis  components as before. Prostate unremarkable. Other: No ascites.  No free air. Musculoskeletal: Stable lumbar spondylitic changes. Bilateral hip DJD right worse than left. IMPRESSION: 1. No acute findings. 2. Right middle lobe pneumonia. 3. Post right nephrectomy. 4.  Aortic  Atherosclerosis (ICD10-I70.0). Electronically Signed   By: Nicoletta Barrier M.D.   On: 07/07/2023 14:27   CT Head Wo Contrast Result Date: 07/07/2023 CLINICAL DATA:  Headache, new onset. EXAM: CT HEAD WITHOUT CONTRAST TECHNIQUE: Contiguous axial images were obtained from the base of the skull through the vertex without intravenous contrast. RADIATION DOSE REDUCTION: This exam was performed according to the departmental dose-optimization program which includes automated exposure control, adjustment of the mA and/or kV according to patient size and/or use of iterative reconstruction technique. COMPARISON:  None Available. FINDINGS: Brain: No acute intracranial hemorrhage. No CT evidence of acute infarct. Nonspecific hypoattenuation in the periventricular and subcortical white matter favored to reflect chronic microvascular ischemic changes. No edema, mass effect, or midline shift. The basilar cisterns are patent. Ventricles: The ventricles are normal. Vascular: Atherosclerotic calcifications of the carotid siphons and intracranial vertebral arteries. No hyperdense vessel. Skull: No acute or aggressive finding. Orbits: Orbits are symmetric. Sinuses: The visualized paranasal sinuses are clear. Other: Mastoid air cells are clear. IMPRESSION: No CT evidence of acute intracranial abnormality. Mild chronic microvascular ischemic changes. Electronically Signed   By: Denny Flack M.D.   On: 07/07/2023 12:04    Scheduled Meds:  aspirin  EC  81 mg Oral QPM   budesonide-glycopyrrolate -formoterol  2 puff Inhalation BID   clopidogrel   75 mg Oral Daily   ezetimibe   10 mg Oral Daily   feeding supplement  237 mL Oral BID BM   heparin   5,000 Units  Subcutaneous Q8H   multivitamin with minerals  1 tablet Oral Daily   nebivolol   10 mg Oral Daily   pantoprazole   40 mg Oral Daily   psyllium  1 packet Oral Daily   rOPINIRole   2 mg Oral QHS   rosuvastatin   10 mg Oral Daily   saccharomyces boulardii  250 mg Oral Daily   vancomycin   125 mg Oral QID   venlafaxine   75 mg Oral Daily   Continuous Infusions:  sodium chloride  100 mL/hr at 07/09/23 0911   azithromycin  200 mL/hr at 07/08/23 1516   cefTRIAXone  (ROCEPHIN )  IV Stopped (07/08/23 1436)     LOS: 2 days   Modena Andes, MD [facility]  07/09/2023, 11:51 AM   *Please note that this is a verbal dictation therefore any spelling or grammatical errors are due to the Dragon Medical One system interpretation.  Please page via Amion and do not message via secure chat for urgent patient care matters. Secure chat can be used for non urgent patient care matters.  How to contact the [facility] Attending or Consulting provider 7A - 7P or covering provider during after hours 7P -7A, for this patient?  Check the care team in [employer] and look for a) attending/consulting [facility] provider listed and b) the [facility] team listed. Page or secure chat 7A-7P. Log into [email or web address] and use [place]'s universal password to access. If you do not have the password, please contact the hospital operator. Locate the [facility] provider you are looking for under [facility] and page to a number that you can be directly reached. If you still have difficulty reaching the provider, please page the [facility] (Director on Call) for the Hospitalists listed on amion for assistance.

## 2023-07-09 NOTE — Plan of Care (Signed)

## 2023-07-09 NOTE — Telephone Encounter (Signed)
 Inbound call from patientl daughter ellen. Requesting f/u call with a nurse to discuss patient being rescheduled for a procedure he had a wl for next week. States patient is currently hospitalized and the hospital has advised her that they are not able to do the procedure while patient is admitted. Please advise.   [phone] Thank you.

## 2023-07-09 NOTE — Telephone Encounter (Signed)
 Pt's daughter returned call & would like patient's barium swallow cancelled next week 07/13/23 d/t pt being hospitalized. It has been cancelled & radiology scheduling number has been provided to daughter for rescheduling.

## 2023-07-10 DIAGNOSIS — N179 Acute kidney failure, unspecified: Secondary | ICD-10-CM | POA: Diagnosis not present

## 2023-07-10 DIAGNOSIS — A0472 Enterocolitis due to Clostridium difficile, not specified as recurrent: Secondary | ICD-10-CM | POA: Insufficient documentation

## 2023-07-10 LAB — BASIC METABOLIC PANEL WITH GFR
Anion gap: 10 (ref 5–15)
BUN: 41 mg/dL — ABNORMAL HIGH (ref 8–23)
CO2: 19 mmol/L — ABNORMAL LOW (ref 22–32)
Calcium: 7.6 mg/dL — ABNORMAL LOW (ref 8.9–10.3)
Chloride: 114 mmol/L — ABNORMAL HIGH (ref 98–111)
Creatinine, Ser: 2.59 mg/dL — ABNORMAL HIGH (ref 0.61–1.24)
GFR, Estimated: 24 mL/min — ABNORMAL LOW (ref >60)
Glucose, Bld: 102 mg/dL — ABNORMAL HIGH (ref 70–99)
Potassium: 4.9 mmol/L (ref 3.5–5.1)
Sodium: 143 mmol/L (ref 135–145)

## 2023-07-10 LAB — MAGNESIUM: Magnesium: 2.3 mg/dL (ref 1.7–2.4)

## 2023-07-10 MED ORDER — VANCOMYCIN HCL 125 MG PO CAPS
125.0000 mg | ORAL_CAPSULE | Freq: Four times a day (QID) | ORAL | 0 refills | Status: AC
Start: 2023-07-10 — End: 2023-07-18

## 2023-07-10 MED ORDER — AMOXICILLIN-POT CLAVULANATE 500-125 MG PO TABS
1.0000 | ORAL_TABLET | Freq: Two times a day (BID) | ORAL | 0 refills | Status: AC
Start: 2023-07-10 — End: 2023-07-13

## 2023-07-10 NOTE — Progress Notes (Signed)
 Mobility Specialist - Progress Note   07/10/23 0946  Mobility  Activity Ambulated with assistance in hallway  Level of Assistance Standby assist, set-up cues, supervision of patient - no hands on  Assistive Device Front wheel walker  Distance Ambulated (ft) 120 ft  Activity Response Tolerated well  Mobility Referral Yes  Mobility visit 1 Mobility  Mobility Specialist Start Time (ACUTE ONLY) [id number]  Mobility Specialist Stop Time (ACUTE ONLY) 0945  Mobility Specialist Time Calculation (min) (ACUTE ONLY) 9 min   Pt received in bed and agreeable to mobility. Fatigued with exertion. No complaints during session. Pt to EOB after session with all needs met.    [facility]

## 2023-07-10 NOTE — Evaluation (Addendum)
 Physical Therapy Evaluation Patient Details Name: [name] MRN: [id number] DOB: Oct 04, 1940 Today's Date: 07/10/2023  History of Present Illness  Patient is a 83 year old male who presented with diarrhea. Patient was admitted with AKI, C diff, hyperkalemia, hypomagnesemia, right middle lobe pneumonia. PMH: COPD, depression, HTM esophageal stenosis, renal cell carcinoma s/p nephrectomy, bypass.  Clinical Impression  Pt admitted with above diagnosis.  Pt reports ind at his baseline, gardens and enjoys mowing his lawn. He reports he is able to get around better in the afternoon vs morning at home. Amb ~ 200' with no device, slightly unsteady however no overt LOB. Pt will benefit from HHPT. Pt and family agreeable        If plan is discharge home, recommend the following: A little help with bathing/dressing/bathroom;Assist for transportation;Assistance with cooking/housework;Help with stairs or ramp for entrance   Can travel by private vehicle        Equipment Recommendations None recommended by PT  Recommendations for Other Services       Functional Status Assessment Patient has had a recent decline in their functional status and demonstrates the ability to make significant improvements in function in a reasonable and predictable amount of time.     Precautions / Restrictions Precautions Precautions: Fall Restrictions Weight Bearing Restrictions Per Provider Order: No      Mobility  Bed Mobility Overal bed mobility: Modified Independent                  Transfers Overall transfer level: Needs assistance Equipment used: None Transfers: Sit to/from Stand Sit to Stand: Supervision           General transfer comment: pt prefers no hands on  assist    Ambulation/Gait Ambulation/Gait assistance: Supervision Gait Distance (Feet): 200 Feet Assistive device: None Gait Pattern/deviations: Step-through pattern, Drifts right/left Gait velocity: decr      General Gait Details: supervision for safety, mild drifting R/L, no overt LOB  Stairs            Wheelchair Mobility     Tilt Bed    Modified Rankin (Stroke Patients Only)       Balance Overall balance assessment: Needs assistance Sitting-balance support: Feet supported, No upper extremity supported Sitting balance-Leahy Scale: Good     Standing balance support: During functional activity, Reliant on assistive device for balance, No upper extremity supported Standing balance-Leahy Scale: Fair Standing balance comment: NT to mod challenges;             High level balance activites: Head turns, Turns, Side stepping High Level Balance Comments: unsteady however no overt LOB             Pertinent Vitals/Pain Pain Assessment Pain Assessment: No/denies pain    Home Living Family/patient expects to be discharged to:: Private residence Living Arrangements: Alone Available Help at Discharge: Family;Available PRN/intermittently Type of Home: House Home Access: Stairs to enter   Entrance Stairs-Number of Steps: 2   Home Layout: Multi-level;Able to live on main level with bedroom/bathroom Home Equipment: Shower seat - built [employer] fundraiser (2 wheels);Cane - single point      Prior Function Prior Level of Function : Independent/Modified Independent             Mobility Comments: pt reports he uses cane at times, has RW but does not typically use it. pt reports he is  typically able to move better as the day progresses ADLs Comments: patients daughter in room indicating she could help  more with sister with patient reporting he did not need any help     Extremity/Trunk Assessment   Upper Extremity Assessment Upper Extremity Assessment: Defer to OT evaluation LUE Deficits / Details: noted to wear construction glove on L hand with patient showing old injury from time in contruction of first two digits. patient reporting that BUE have different ROM  depending on day with neck pain from time employed as [employer] investment [employer]. declined to demonstrate ROM on this date.    Lower Extremity Assessment Lower Extremity Assessment: Generalized weakness    Cervical / Trunk Assessment Cervical / Trunk Assessment: Kyphotic  Communication   Communication Communication: No apparent difficulties    Cognition Arousal: Alert Behavior During Therapy: WFL for tasks assessed/performed   PT - Cognitive impairments: No apparent impairments                         Following commands: Intact       Cueing Cueing Techniques: Verbal cues     General Comments      Exercises     Assessment/Plan    PT Assessment All further PT needs can be met in the next venue of care  PT Problem List         PT Treatment Interventions      PT Goals (Current goals can be found in the Care Plan section)  Acute Rehab PT Goals PT Goal Formulation: All assessment and education complete, DC therapy (pt to d/c home today)    Frequency       Co-evaluation               AM-PAC PT 6 Clicks Mobility  Outcome Measure Help needed turning from your back to your side while in a flat bed without using bedrails?: None Help needed moving from lying on your back to sitting on the side of a flat bed without using bedrails?: None Help needed moving to and from a bed to a chair (including a wheelchair)?: None Help needed standing up from a chair using your arms (e.g., wheelchair or bedside chair)?: None Help needed to walk in hospital room?: A Little Help needed climbing 3-5 steps with a railing? : A Little 6 Click Score: 22    End of Session Equipment Utilized During Treatment: Other (comment) (declined gait belt) Activity Tolerance: Patient tolerated treatment well Patient left: in bed;with family/visitor present;with bed alarm set Nurse Communication: Mobility status PT Visit Diagnosis: Other abnormalities of gait and mobility (R26.89)     Time: 8847-8788 PT Time Calculation (min) (ACUTE ONLY):   Charges:   PT Evaluation $PT Eval Low Complexity: 1 Low   PT General Charges $$ ACUTE PT VISIT: 1 Visit         Scarleth Brame, PT  Acute Rehab Dept [facility]) [phone]  07/10/2023   [facility] 07/10/2023, 12:54 PM

## 2023-07-10 NOTE — Discharge Summary (Signed)
 Physician Discharge Summary  [name] FMW:[id number] DOB: 02-15-1940 DOA: 07/07/2023  PCP: Theophilus Andrews, Tully GRADE, MD  Admit date: 07/07/2023 Discharge date: 07/10/2023 30 Day Unplanned Readmission Risk Score    Flowsheet Row ED to Hosp-Admission (Current) from 07/07/2023 in [facility]  30 Day Unplanned Readmission Risk Score (%) 27.57 Filed at 07/10/2023 1200    This score is the patient's risk of an unplanned readmission within 30 days of being discharged (0 -100%). The score is based on dignosis, age, lab data, medications, orders, and past utilization.   Low:  0-14.9   Medium: 15-21.9   High: 22-29.9   Extreme: 30 and above          Admitted From: Home Disposition: Home  Recommendations for Outpatient Follow-up:  Follow up with PCP in 1-2 weeks Please obtain BMP/CBC in one week Please follow up with your PCP on the following pending results: Unresulted Labs (From admission, onward)     Start     Ordered   07/08/23 1114  Expectorated Sputum Assessment w Gram Stain, Rflx to Resp Cult  Once,   R        07/08/23 1113   07/08/23 1113  Legionella Pneumophila Serogp 1 Ur Ag  Once,   R        07/08/23 1113              Home Health: Yes Equipment/Devices: None  Discharge Condition: Stable CODE STATUS: Full code Diet recommendation: Cardiac  Subjective: Seen and examined.  No complaints other than diarrhea.  He claims that he has had at least 6 loose bowel movements in the last 24 hours since I saw him.  However there is only 1 bowel movement charted in the epic.  I discussed with patient's primary nurse today, this is the same nurse who took care of him yesterday.  According to her, patient had only 1 bowel movement during her shift all day yesterday.  She further told me that she was not told by the night nurse that patient had any bowel movement overnight.  I then had a lengthy discussion with patient's daughter [name] over  the phone.  [name] tells me that patient is very obsessed about his health conditions and he often complains a lot.  She believes that he is more depressed than he typically is.  She is going to arrange outpatient follow-up with patient's PCP as well as psychiatry as well.  She was in full agreement with me discharging him today especially that his labs are looking better and he does not appear to have any electrolyte abnormalities to support multiple bowel movements as he is claiming.  Brief/Interim Summary:  [name] is a 83 y.o. male with medical history significant for COPD on room air, depression, hypertension, hyperlipidemia, esophageal stenosis, renal cell carcinoma status post nephrectomy came to the emergency department with complaint of 1 week of diarrhea.  Patient denied using any antibiotics lately however our pharmacist did some research and found out that patient was treated with antibiotics twice in the last 6 months.  Patient did not have any other complaint such as abdominal pain or nausea.  CT abdomen was unremarkable.  Patient was then admitted under hospital service for further workup for the diarrhea and he eventually tested positive for EPEC as well as C. difficile.  He was started on vancomycin  oral.  Patient has remained afebrile with no abdominal pain or tenderness and no electrolyte  abnormalities anymore as of today.  He did have hyperkalemia upon presentation, details about that as below.  Further discussion about the diarrhea as above.  Seen by PT OT, home health recommended so we are discharging him home and we will prescribe him 8 more days of oral vancomycin .   AKI superimposed on CKD stage IV (CKD stage III ruled out)-baseline creatinine appears to be about 2.6.  Creatinine upon arrival was 3.76 which has improved and back to 2.6 today which is his baseline.   Hyperkalemia-with minimal EKG changes, received Lokelma , IV insulin /glucose in the emergency department.   Hyperkalemia due to AKI.  Finally improved and has been normal since last 2 days now.  Hypomagnesemia: Replenished.   Hyponatremia: Mild.  Resolved.   Right middle lobe pneumonia-patient without fever, leukocytosis or hypoxia, though has had some dyspnea in the last few days.  Query whether he aspirated, though he has not vomited.  He does have a history of esophageal stricture and recent dysphagia. He was started and continued on Rocephin  and Zithromax .  Now discharging on Augmentin/renally dosed.   Hypertension-blood pressure within normal limit but elevated at times.  Resuming home medications.   CAD: Asymptomatic.  Cardiology's left heart catheterization note reviewed.  Per them, patient is supposed to be taking DAPT aspirin  and Plavix  indefinitely.  Resumed both of them.   Hyperlipidemia: Continue statin.   Rectal pain: On 07/08/2023, patient complained to me that he has been having rectal pain for 3 to 4 years.  He said that he has asked several providers in the past prior to coming to the hospital but no one ever checked him.  I examined his rectum in presence of patient's primary nurse as a chaperone.  I could not find any abnormality.  I informed the patient that he may be feeling this way because of multiple episodes of watery diarrhea.  If he continues to feel that feeling despite of resolution of diarrhea then he should consider consulting with general surgery as outpatient.  Discharge plan was discussed with patient and/or family member and they verbalized understanding and agreed with it.  Discharge Diagnoses:  Principal Problem:   AKI (acute kidney injury) (HCC) Active Problems:   Hypothyroidism   Essential hypertension   Hyperlipidemia LDL goal <70   C. difficile diarrhea    Discharge Instructions   Allergies as of 07/10/2023   No Known Allergies      Medication List     TAKE these medications    acetaminophen  500 MG tablet Commonly known as: TYLENOL  Take  1,000 mg by mouth every 6 (six) hours as needed for moderate pain.   amLODipine  5 MG tablet Commonly known as: NORVASC  TAKE 1 TABLET BY MOUTH DAILY   amoxicillin -clavulanate 500-125 MG tablet Commonly known as: Augmentin Take 1 tablet by mouth 2 (two) times daily for 3 days.   aspirin  EC 81 MG tablet Take 81 mg by mouth every evening. Swallow whole.   BENGAY EX Apply 1 application topically daily as needed (pain).   cetirizine 10 MG tablet Commonly known as: ZYRTEC Take 10 mg by mouth daily as needed for allergies.   clopidogrel  75 MG tablet Commonly known as: PLAVIX  TAKE 1 TABLET BY MOUTH ONCE  DAILY   Coenzyme Q10 200 MG capsule Take 200 mg by mouth daily.   cyanocobalamin  1000 MCG/ML injection Commonly known as: VITAMIN B12 INJECT 1 ML SUBCUTANEOUSLY ONCE EVERY MONTH   ezetimibe  10 MG tablet Commonly known as: ZETIA  TAKE 1 TABLET  BY MOUTH DAILY   fluticasone  50 MCG/ACT nasal spray Commonly known as: FLONASE  Place 1 spray into both nostrils daily as needed for allergies.   GENTLE IRON PO Take 1 tablet by mouth daily.   isosorbide  mononitrate 60 MG 24 hr tablet Commonly known as: IMDUR  TAKE 1 TABLET BY MOUTH DAILY   METAMUCIL FIBER PO Take 1 capsule by mouth in the morning.   multivitamin tablet Take 1 tablet by mouth daily.   nebivolol  10 MG tablet Commonly known as: BYSTOLIC  TAKE 1 TABLET BY MOUTH DAILY   NEEDLE (DISP) 25 G 25G X 1 Misc Commonly known as: B-D DISP NEEDLE 25GX1 Inject 1000 mcg into muscle once a month.   nitroGLYCERIN  0.4 MG SL tablet Commonly known as: NITROSTAT  PLACE 1 TABLET UNDER THE TONGUE EVERY 5 MINUTES AS NEEDED FOR CHEST PAIN   Opcon-A 0.027-0.315 % Soln Generic drug: Naphazoline-Pheniramine Place 1 drop into both eyes daily as needed (itching eyes).   oxyCODONE -acetaminophen  10-325 MG tablet Commonly known as: PERCOCET Take 1 tablet by mouth 2 (two) times daily.   pantoprazole  40 MG tablet Commonly known as:  PROTONIX  TAKE 1 TABLET BY MOUTH ONCE  DAILY   polyethylene glycol powder 17 GM/SCOOP powder Commonly known as: GLYCOLAX/MIRALAX Take 1 Container by mouth daily. As needed   rOPINIRole  2 MG tablet Commonly known as: REQUIP  TAKE 1 TABLET BY MOUTH AT  BEDTIME   rosuvastatin  10 MG tablet Commonly known as: CRESTOR  TAKE 1 TABLET BY MOUTH DAILY   saccharomyces boulardii 250 MG capsule Commonly known as: FLORASTOR Take 250 mg by mouth daily.   Trelegy Ellipta  200-62.5-25 MCG/ACT Aepb Generic drug: Fluticasone -Umeclidin-Vilant Inhale 1 puff into the lungs daily.   vancomycin  125 MG capsule Commonly known as: VANCOCIN  Take 1 capsule (125 mg total) by mouth 4 (four) times daily for 8 days.   venlafaxine  75 MG tablet Commonly known as: EFFEXOR  TAKE 1 TABLET BY MOUTH DAILY        Follow-up Information     Theophilus Andrews, Tully GRADE, MD Follow up in 1 week(s).   Specialty: Internal Medicine Contact information: [address] [place] [address] [phone]         [facility], [facility] Follow up.   Specialty: Home Health Services Why: [facility] will provide PT and OT in the home after discharge. Contact information: [address] [place] [address] [phone]                No Known Allergies  Consultations: None   Procedures/Studies: CT L-SPINE NO CHARGE Result Date: 07/07/2023 CLINICAL DATA:  Chronic low back pain EXAM: CT LUMBAR SPINE WITHOUT CONTRAST TECHNIQUE: Multidetector CT imaging of the lumbar spine was performed without intravenous contrast administration. Multiplanar CT image reconstructions were also generated. RADIATION DOSE REDUCTION: This exam was performed according to the departmental dose-optimization program which includes automated exposure control, adjustment of the mA and/or kV according to patient size and/or use of iterative reconstruction technique. COMPARISON:  01/01/2023 FINDINGS: Segmentation: 5 non  rib-bearing lumbar segments assigned L1-L5. Alignment: Normal Vertebrae: No acute fracture or focal lesion. Chronic endplate deformities L1-2, stable since previous. Paraspinal and other soft tissues: Aortic Atherosclerosis (ICD10-170.0). Left urolithiasis. Post right nephrectomy. Disc levels: Exuberant anterior endplate spurring U87-O5. Chronic moderate narrowing of the L4-5 and L5-S1 interspaces with small endplate spurs. Post left laminotomy L4-5. IMPRESSION: 1. No acute findings. 2. Chronic degenerative and postoperative changes as above. 3. Left urolithiasis. 4.  Aortic Atherosclerosis (ICD10-I70.0). Electronically Signed  By: JONETTA Faes M.D.   On: 07/07/2023 14:31   CT ABDOMEN PELVIS WO CONTRAST Result Date: 07/07/2023 CLINICAL DATA:  Abdominal pain, acute, nonlocalized EXAM: CT ABDOMEN AND PELVIS WITHOUT CONTRAST TECHNIQUE: Multidetector CT imaging of the abdomen and pelvis was performed following the standard protocol without IV contrast. RADIATION DOSE REDUCTION: This exam was performed according to the departmental dose-optimization program which includes automated exposure control, adjustment of the mA and/or kV according to patient size and/or use of iterative reconstruction technique. COMPARISON:  01/01/2023 FINDINGS: Lower chest: Chronic pleural thickening at the left lung base. Pleural based focal airspace consolidation in the anteromedial right middle lobe, new since previous. Hepatobiliary: Post cholecystectomy. Pneumobilia implying patent sphincterotomy. No new liver lesion or biliary ductal dilatation. Pancreas: Unremarkable. No pancreatic ductal dilatation or surrounding inflammatory changes. Spleen: Normal in size without focal abnormality. Adrenals/Urinary Tract: No adrenal mass. Post right nephrectomy. Left kidney is normal in contour. 2 mm calcification peripherally in the mid renal collecting system. No hydronephrosis. Urinary bladder incompletely distended. Stomach/Bowel: stomach is  partially distended, without acute finding. Small bowel decompressed. Normal appendix. Colon is partially distended, without acute finding. Vascular/Lymphatic: Heavy calcified aortoiliac atheromatous plaque without aneurysm. Circumaortic left renal vein, anatomic variant. No abdominal or pelvic adenopathy. Reproductive: Penile prosthesis components as before. Prostate unremarkable. Other: No ascites.  No free air. Musculoskeletal: Stable lumbar spondylitic changes. Bilateral hip DJD right worse than left. IMPRESSION: 1. No acute findings. 2. Right middle lobe pneumonia. 3. Post right nephrectomy. 4.  Aortic Atherosclerosis (ICD10-I70.0). Electronically Signed   By: JONETTA Faes M.D.   On: 07/07/2023 14:27   DG Chest Port 1 View Result Date: 07/07/2023 CLINICAL DATA:  Shortness of breath EXAM: PORTABLE CHEST 1 VIEW COMPARISON:  Chest x-ray 06/03/2020. FINDINGS: Sternal wires. Stable cardiopericardial silhouette. Fixation hardware along the lower cervical spine. Blunting of the left costophrenic angle is stable from previous. Tiny effusion and or pleural thickening. Mild linear opacity left lung base as well as stable. No pneumothorax or new consolidation. No edema. Degenerative changes along the spine. IMPRESSION: No significant will change when adjusted for technique. Electronically Signed   By: Ranell Bring M.D.   On: 07/07/2023 12:35   CT Head Wo Contrast Result Date: 07/07/2023 CLINICAL DATA:  Headache, new onset. EXAM: CT HEAD WITHOUT CONTRAST TECHNIQUE: Contiguous axial images were obtained from the base of the skull through the vertex without intravenous contrast. RADIATION DOSE REDUCTION: This exam was performed according to the departmental dose-optimization program which includes automated exposure control, adjustment of the mA and/or kV according to patient size and/or use of iterative reconstruction technique. COMPARISON:  None Available. FINDINGS: Brain: No acute intracranial hemorrhage. No CT  evidence of acute infarct. Nonspecific hypoattenuation in the periventricular and subcortical white matter favored to reflect chronic microvascular ischemic changes. No edema, mass effect, or midline shift. The basilar cisterns are patent. Ventricles: The ventricles are normal. Vascular: Atherosclerotic calcifications of the carotid siphons and intracranial vertebral arteries. No hyperdense vessel. Skull: No acute or aggressive finding. Orbits: Orbits are symmetric. Sinuses: The visualized paranasal sinuses are clear. Other: Mastoid air cells are clear. IMPRESSION: No CT evidence of acute intracranial abnormality. Mild chronic microvascular ischemic changes. Electronically Signed   By: Donnice Mania M.D.   On: 07/07/2023 12:04     Discharge Exam: Vitals:   07/10/23 0456 07/10/23 1219  BP: (!) 143/65 136/69  Pulse: 67 67  Resp: 16 17  Temp: (!) 97.5 F (36.4 C) 97.9 F (36.6 C)  SpO2: 96%    Vitals:   07/09/23 1959 07/09/23 2113 07/10/23 0456 07/10/23 1219  BP:  133/62 (!) 143/65 136/69  Pulse:  69 67 67  Resp:  18 16 17   Temp:  98 F (36.7 C) (!) 97.5 F (36.4 C) 97.9 F (36.6 C)  TempSrc:  Oral  Oral  SpO2: 98% 98% 96%   Weight:      Height:        General: Pt is alert, awake, not in acute distress Cardiovascular: RRR, S1/S2 +, no rubs, no gallops Respiratory: CTA bilaterally, no wheezing, no rhonchi Abdominal: Soft, NT, ND, bowel sounds + Extremities: no edema, no cyanosis    The results of significant diagnostics from this hospitalization (including imaging, microbiology, ancillary and laboratory) are listed below for reference.     Microbiology: Recent Results (from the past 240 hours)  C Difficile Quick Screen w PCR reflex     Status: Abnormal   Collection Time: 07/07/23 10:35 AM   Specimen: STOOL  Result Value Ref Range Status   C Diff antigen POSITIVE (A) NEGATIVE Final   C Diff toxin POSITIVE (A) NEGATIVE Final   C Diff interpretation Toxin producing C.  difficile detected.  Final    Comment: CRITICAL RESULT CALLED TO, READ BACK BY AND VERIFIED WITH: Whitesell, T. 1119 07/08/23 by JE Performed at [facility], [address] [place], [place] [address]   Gastrointestinal Panel by PCR , Stool     Status: Abnormal   Collection Time: 07/07/23 10:35 AM   Specimen: STOOL  Result Value Ref Range Status   Campylobacter species NOT DETECTED NOT DETECTED Final   Plesimonas shigelloides NOT DETECTED NOT DETECTED Final   Salmonella species NOT DETECTED NOT DETECTED Final   Yersinia enterocolitica NOT DETECTED NOT DETECTED Final   Vibrio species NOT DETECTED NOT DETECTED Final   Vibrio cholerae NOT DETECTED NOT DETECTED Final   Enteroaggregative E coli (EAEC) NOT DETECTED NOT DETECTED Final   Enteropathogenic E coli (EPEC) DETECTED (A) NOT DETECTED Final    Comment: RESULT CALLED TO, READ BACK BY AND VERIFIED WITH: Rosaline Irish @0002  on 07/09/23 skl    Enterotoxigenic E coli (ETEC) NOT DETECTED NOT DETECTED Final   Shiga like toxin producing E coli (STEC) NOT DETECTED NOT DETECTED Final   Shigella/Enteroinvasive E coli (EIEC) NOT DETECTED NOT DETECTED Final   Cryptosporidium NOT DETECTED NOT DETECTED Final   Cyclospora cayetanensis NOT DETECTED NOT DETECTED Final   Entamoeba histolytica NOT DETECTED NOT DETECTED Final   Giardia lamblia NOT DETECTED NOT DETECTED Final   Adenovirus F40/41 NOT DETECTED NOT DETECTED Final   Astrovirus NOT DETECTED NOT DETECTED Final   Norovirus GI/GII NOT DETECTED NOT DETECTED Final   Rotavirus A NOT DETECTED NOT DETECTED Final   Sapovirus (I, II, IV, and V) NOT DETECTED NOT DETECTED Final    Comment: Performed at [facility], [address] [place], [place] [address]     Labs: BNP (last 3 results) No results for input(s): BNP in the last 8760 hours. Basic Metabolic Panel: Recent Labs  Lab 07/07/23 1058 07/07/23 1932 07/08/23 1019 07/08/23 1802 07/09/23 0342  07/10/23 0415  NA 142 142 142  --  146* 143  K 6.3* 5.3* 5.2* 4.8 5.0 4.9  CL 111 112* 114*  --  116* 114*  CO2 22 22 21*  --  21* 19*  GLUCOSE 109* 76 108*  --  82 102*  BUN 73* 65* 61*  --  54* 41*  CREATININE 3.76* 3.15* 3.20*  --  3.14* 2.59*  CALCIUM  8.4* 7.5* 8.0*  --  7.6* 7.6*  MG 1.5*  --  1.4*  --  1.4* 2.3   Liver Function Tests: Recent Labs  Lab 07/07/23 1058 07/08/23 1019  AST 82* 65*  ALT 41 37  ALKPHOS 71 69  BILITOT 0.8 0.6  PROT 6.9 6.3*  ALBUMIN  3.3* 3.1*   Recent Labs  Lab 07/07/23 1058  LIPASE 71*   No results for input(s): AMMONIA in the last 168 hours. CBC: Recent Labs  Lab 07/07/23 1058 07/08/23 1019  WBC 6.9 6.2  NEUTROABS 4.2 3.9  HGB 13.0 12.2*  HCT 42.1 39.9  MCV 90.5 92.4  PLT 103* 101*   Cardiac Enzymes: No results for input(s): CKTOTAL, CKMB, CKMBINDEX, TROPONINI in the last 168 hours. BNP: Invalid input(s): POCBNP CBG: Recent Labs  Lab 07/07/23 1418 07/07/23 1659 07/08/23 0838  GLUCAP 85 112* 70   D-Dimer No results for input(s): DDIMER in the last 72 hours. Hgb A1c No results for input(s): HGBA1C in the last 72 hours. Lipid Profile No results for input(s): CHOL, HDL, LDLCALC, TRIG, CHOLHDL, LDLDIRECT in the last 72 hours. Thyroid  function studies No results for input(s): TSH, T4TOTAL, T3FREE, THYROIDAB in the last 72 hours.  Invalid input(s): FREET3 Anemia work up No results for input(s): VITAMINB12, FOLATE, FERRITIN, TIBC, IRON, RETICCTPCT in the last 72 hours. Urinalysis    Component Value Date/Time   COLORURINE YELLOW 07/07/2023 1426   APPEARANCEUR CLEAR 07/07/2023 1426   LABSPEC 1.014 07/07/2023 1426   PHURINE 5.0 07/07/2023 1426   GLUCOSEU NEGATIVE 07/07/2023 1426   HGBUR NEGATIVE 07/07/2023 1426   BILIRUBINUR NEGATIVE 07/07/2023 1426   BILIRUBINUR 3 06/21/2015 0838   KETONESUR NEGATIVE 07/07/2023 1426   PROTEINUR 100 (A) 07/07/2023 1426   UROBILINOGEN  0.2 06/21/2015 0838   NITRITE NEGATIVE 07/07/2023 1426   LEUKOCYTESUR NEGATIVE 07/07/2023 1426   Sepsis Labs Recent Labs  Lab 07/07/23 1058 07/08/23 1019  WBC 6.9 6.2   Microbiology Recent Results (from the past 240 hours)  C Difficile Quick Screen w PCR reflex     Status: Abnormal   Collection Time: 07/07/23 10:35 AM   Specimen: STOOL  Result Value Ref Range Status   C Diff antigen POSITIVE (A) NEGATIVE Final   C Diff toxin POSITIVE (A) NEGATIVE Final   C Diff interpretation Toxin producing C. difficile detected.  Final    Comment: CRITICAL RESULT CALLED TO, READ BACK BY AND VERIFIED WITH: Whitesell, T. 1119 07/08/23 by JE Performed at [facility], [address] [place], [place] [address]   Gastrointestinal Panel by PCR , Stool     Status: Abnormal   Collection Time: 07/07/23 10:35 AM   Specimen: STOOL  Result Value Ref Range Status   Campylobacter species NOT DETECTED NOT DETECTED Final   Plesimonas shigelloides NOT DETECTED NOT DETECTED Final   Salmonella species NOT DETECTED NOT DETECTED Final   Yersinia enterocolitica NOT DETECTED NOT DETECTED Final   Vibrio species NOT DETECTED NOT DETECTED Final   Vibrio cholerae NOT DETECTED NOT DETECTED Final   Enteroaggregative E coli (EAEC) NOT DETECTED NOT DETECTED Final   Enteropathogenic E coli (EPEC) DETECTED (A) NOT DETECTED Final    Comment: RESULT CALLED TO, READ BACK BY AND VERIFIED WITH: Rosaline Irish @0002  on 07/09/23 skl    Enterotoxigenic E coli (ETEC) NOT DETECTED NOT DETECTED Final   Shiga like toxin producing E coli (STEC) NOT DETECTED  NOT DETECTED Final   Shigella/Enteroinvasive E coli (EIEC) NOT DETECTED NOT DETECTED Final   Cryptosporidium NOT DETECTED NOT DETECTED Final   Cyclospora cayetanensis NOT DETECTED NOT DETECTED Final   Entamoeba histolytica NOT DETECTED NOT DETECTED Final   Giardia lamblia NOT DETECTED NOT DETECTED Final   Adenovirus F40/41 NOT DETECTED NOT DETECTED Final    Astrovirus NOT DETECTED NOT DETECTED Final   Norovirus GI/GII NOT DETECTED NOT DETECTED Final   Rotavirus A NOT DETECTED NOT DETECTED Final   Sapovirus (I, II, IV, and V) NOT DETECTED NOT DETECTED Final    Comment: Performed at [facility], [address] [place], [place] [address]    FURTHER DISCHARGE INSTRUCTIONS:   Get Medicines reviewed and adjusted: Please take all your medications with you for your next visit with your Primary MD   Laboratory/radiological data: Please request your Primary MD to go over all hospital tests and procedure/radiological results at the follow up, please ask your Primary MD to get all Hospital records sent to his/her office.   In some cases, they will be blood work, cultures and biopsy results pending at the time of your discharge. Please request that your primary care M.D. goes through all the records of your hospital data and follows up on these results.   Also Note the following: If you experience worsening of your admission symptoms, develop shortness of breath, life threatening emergency, suicidal or homicidal thoughts you must seek medical attention immediately by calling 911 or calling your MD immediately  if symptoms less severe.   You must read complete instructions/literature along with all the possible adverse reactions/side effects for all the Medicines you take and that have been prescribed to you. Take any new Medicines after you have completely understood and accpet all the possible adverse reactions/side effects.    patient was instructed, not to drive, operate heavy machinery, perform activities at heights, swimming or participation in water  activities or provide baby-sitting services while on Pain, Sleep and Anxiety Medications; until their outpatient Physician has advised to do so again. Also recommended to not to take more than prescribed Pain, Sleep and Anxiety Medications.  It is not advisable to combine anxiety, sleep and  pain medications without talking with your primary care provider.     Wear Seat belts while driving.   Please note: You were cared for by a hospitalist during your hospital stay. Once you are discharged, your primary care physician will handle any further medical issues. Please note that NO REFILLS for any discharge medications will be authorized once you are discharged, as it is imperative that you return to your primary care physician (or establish a relationship with a primary care physician if you do not have one) for your post hospital discharge needs so that they can reassess your need for medications and monitor your lab values  Time coordinating discharge: Over 30 minutes  SIGNED:   Fredia Skeeter, MD  [facility] 07/10/2023, 1:11 PM *Please note that this is a verbal dictation therefore any spelling or grammatical errors are due to the Dragon [employer] One system interpretation. If 7PM-7AM, please contact night-coverage [email or web address]

## 2023-07-10 NOTE — TOC Transition Note (Signed)
 Transition of Care [facility]) - Discharge Note  Patient Details  Name: [name] MRN: [id number] Date of Birth: 11-02-1940  Transition of Care [facility]) CM/SW Contact:  Duwaine GORMAN Aran, LCSW Phone Number: 07/10/2023, 1:09 PM  Clinical Narrative: PT/OT evaluations recommended HH. Patient agreeable to [facility] being set up and has used [facility] before, so he would like to use the agency again. CSW made [facility] referral to Cindie with [facility], which was accepted. HH orders placed by hospitalist. Daughter and patient updated. TOC signing off.  Final next level of care: Home w Home Health Services Barriers to Discharge: Barriers Resolved  Patient Goals and CMS Choice Patient states their goals for this hospitalization and ongoing recovery are:: Get [facility] through [facility].gov Compare Post Acute Care list provided to:: Patient Choice offered to / list presented to : Patient  Discharge Plan and Services Additional resources added to the After Visit Summary for   In-house Referral: Clinical Social Work    DME Arranged: N/A DME Agency: NA HH Arranged: PT, OT HH Agency: [facility] Date [facility] Agency Contacted: 07/10/23 Time HH Agency Contacted: 1227 Representative spoke with at [facility] Agency: Cindie  Social Drivers of Health (SDOH) Interventions SDOH Screenings   Food Insecurity: No Food Insecurity (07/08/2023)  Housing: Low Risk  (07/08/2023)  Transportation Needs: No Transportation Needs (07/08/2023)  Utilities: Not At Risk (07/08/2023)  Depression (PHQ2-9): Low Risk  (05/11/2023)  Financial Resource Strain: Low Risk  (02/10/2023)  Physical Activity: Unknown (02/10/2023)  Recent Concern: Physical Activity - Inactive (02/10/2023)  Social Connections: Moderately Integrated (07/08/2023)  Stress: No Stress Concern Present (02/10/2023)  Tobacco Use: Medium Risk (07/08/2023)   Readmission Risk Interventions    07/08/2023    9:48 AM  Readmission Risk Prevention Plan  Transportation Screening  Complete  HRI or Home Care Consult Complete  Social Work Consult for Recovery Care Planning/Counseling Complete  Palliative Care Screening Not Applicable  Medication Review Oceanographer) Complete

## 2023-07-10 NOTE — Evaluation (Signed)
 Occupational Therapy Evaluation Patient Details Name: [name] MRN: [id number] DOB: 18-Aug-1940 Today's Date: 07/10/2023   History of Present Illness   Patient is a 83 year old male who presented with diarrhea. Patient was admitted with AKI, C diff, hyperkalemia, hypomagnesemia, right middle lobe pneumonia. PMH: COPD, depression, HTM esophageal stenosis, renal cell carcinoma s/p nephrectomy, bypass.     Clinical Impressions Patient is a 83 year old male who was admitted for above. Patient was living at home alone furniture walking prior level. Currently, patient endorsed feeling weaker but not open to any compensatory strategies attempted to educate patient on. Patient was noted to have decreased functional activity tolerance,poor intellectual awareness of deficits, decreased endurance, decreased standing balance, decreased safety awareness, and decreased knowledge of AD/AE impacting participation in ADLs. Patients daughter in room reporting they can help more if patient would allow it in next level of care. Plan is for patient to d/c home with family support and [facility] services.       If plan is discharge home, recommend the following:   A little help with walking and/or transfers;A little help with bathing/dressing/bathroom;Assistance with cooking/housework;Direct supervision/assist for medications management;Assist for transportation;Help with stairs or ramp for entrance;Direct supervision/assist for financial management     Functional Status Assessment   Patient has had a recent decline in their functional status and demonstrates the ability to make significant improvements in function in a reasonable and predictable amount of time.     Equipment Recommendations   None recommended by OT      Precautions/Restrictions   Precautions Precautions: Fall Restrictions Weight Bearing Restrictions Per Provider Order: No     Mobility Bed Mobility Overal bed mobility:  Modified Independent                         Balance Overall balance assessment: Mild deficits observed, not formally tested           ADL either performed or assessed with clinical judgement   ADL Overall ADL's : Needs assistance/impaired Eating/Feeding: Supervision/ safety   Grooming: Sitting;Supervision/safety;Set up Grooming Details (indicate cue type and reason): would assume more assist needed on days where UE/neck dont feel well Upper Body Bathing: Sitting;Supervision/ safety;Set up   Lower Body Bathing: Sitting/lateral leans;Set up;Supervison/ safety Lower Body Bathing Details (indicate cue type and reason): able to figure four BLE seated with no c/o pain. Upper Body Dressing : Sitting;Supervision/safety;Set up   Lower Body Dressing: Sitting/lateral leans;Supervision/safety;Contact guard assist;Set up Lower Body Dressing Details (indicate cue type and reason): patient declining to use RW when standing. Toilet Transfer: Supervision/safety;Set up Toilet Transfer Details (indicate cue type and reason): patient declining for therapsit to touch him with noted unsteadiness with movemetn. patient furniture walking and declining for therapist to provide education or use RW. Toileting- Clothing Manipulation and Hygiene: Sitting/lateral lean;Supervision/safety         General ADL Comments: patient was educated on ECT and speaking with family about support in next level of care. patient was not receptive to education with reports that family is not helpful with daughter in room attempting to say she would help him more if he allowed it. patient and family encouraged to speak more with each other and TOC about care options to reduce risk of rehospitalization.     Vision   Vision Assessment?: No apparent visual deficits            Pertinent Vitals/Pain Pain Assessment Pain Assessment: Faces Faces Pain Scale:  Hurts a little bit Pain Location: shoulders, and neck Pain  Descriptors / Indicators: Constant Pain Intervention(s): Repositioned, Limited activity within patient's tolerance, Monitored during session     Extremity/Trunk Assessment Upper Extremity Assessment Upper Extremity Assessment: Overall WFL for tasks assessed;LUE deficits/detail LUE Deficits / Details: noted to wear construction glove on L hand with patient showing old injury from time in contruction of first two digits. 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 declined to demonstrate ROM on this date.       Cervical / Trunk Assessment Cervical / Trunk Assessment: Kyphotic      Cognition Arousal: Alert Behavior During Therapy: Flat affect Cognition: Cognition impaired     Awareness: Intellectual awareness impaired, Online awareness impaired       OT - Cognition Comments: patient is a strong independent man who was not receptive to any compensatory strategies during session. daughter was present during session.                 Following commands: Intact                  Home Living Family/patient expects to be discharged to:: Private residence Living Arrangements: Alone Available Help at Discharge: Family;Available PRN/intermittently Type of Home: House Home Access: Stairs to enter Entrance Stairs-Number of Steps: 2   Home Layout: Multi-level;Able to live on main level with bedroom/bathroom     Bathroom Shower/Tub: Walk-in shower         Home Equipment: Shower seat - built in          Prior Functioning/Environment Prior Level of Function : Independent/Modified Independent               ADLs Comments: patients daughter in room indicating she could help more with sister with patient reporting he did not need any help    OT Problem List: Impaired balance (sitting and/or standing);Decreased knowledge of precautions;Decreased safety awareness;Decreased activity  tolerance;Decreased knowledge of use of DME or AE   OT Treatment/Interventions: Self-care/ADL training;DME and/or AE instruction;Therapeutic activities;Balance training;Energy conservation;Patient/family education      OT Goals(Current goals can be found in the care plan section)   Acute Rehab OT Goals Patient Stated Goal: to go home today OT Goal Formulation: With patient Time For Goal Achievement: 07/24/23 Potential to Achieve Goals: Fair   OT Frequency:  Min 1X/week       AM-PAC OT 6 Clicks Daily Activity     Outcome Measure Help from another person eating meals?: None Help from another person taking care of personal grooming?: None Help from another person toileting, which includes using toliet, bedpan, or urinal?: A Little Help from another person bathing (including washing, rinsing, drying)?: A Little Help from another person to put on and taking off regular upper body clothing?: None Help from another person to put on and taking off regular lower body clothing?: A Little 6 Click Score: 21   End of Session    Activity Tolerance: Patient tolerated treatment well Patient left: in bed;with call bell/phone within reach;with family/visitor present  OT Visit Diagnosis: Unsteadiness on feet (R26.81);Other abnormalities of gait and mobility (R26.89)                Time: 9040-8982 OT Time Calculation (min): 18 min Charges:  OT General Charges $OT Visit: 1 Visit OT Evaluation $OT Eval Low Complexity: 1 Low  Kadija Cruzen OTR/L, MS Acute Rehabilitation Department Office# [phone]  Geofm CHRISTELLA Dance 07/10/2023, 10:38 AM

## 2023-07-10 NOTE — Plan of Care (Signed)

## 2023-07-12 ENCOUNTER — Telehealth: Payer: Self-pay

## 2023-07-12 ENCOUNTER — Encounter: Payer: Self-pay | Admitting: Internal Medicine

## 2023-07-12 LAB — LEGIONELLA PNEUMOPHILA SEROGP 1 UR AG: L. pneumophila Serogp 1 Ur Ag: NEGATIVE

## 2023-07-12 NOTE — Transitions of Care (Post Inpatient/ED Visit) (Signed)
 07/12/2023  Name: [name] MRN: [id number] DOB: Jun 11, 1940  Today's TOC FU Call Status: Today's TOC FU Call Status:: Successful TOC FU Call Completed TOC FU Call Complete Date: 07/12/23 Patient's Name and Date of Birth confirmed.  Transition Care Management Follow-up Telephone Call Date of Discharge: 07/10/23 Discharge Facility: [facility]) Type of Discharge: Inpatient Admission Primary Inpatient Discharge Diagnosis:: AKI How have you been since you were released from the hospital?: Better (decrease diarrhea stools.  unknown number- patient unclear.) Any questions or concerns?: No  Items Reviewed: Did you receive and understand the discharge instructions provided?: Yes Medications obtained,verified, and reconciled?: Yes (Medications Reviewed) Any new allergies since your discharge?: No Dietary orders reviewed?: Yes Type of Diet Ordered:: low sodium diet Do you have support at home?: Yes People in Home [RPT]: alone, child(ren), adult Name of Support/Comfort Primary Source: Daughter Leeroy lives down the street.  Medications Reviewed Today: Medications Reviewed Today     Reviewed by Rumalda Alan PENNER, RN (Registered Nurse) on 07/12/23 at 1142  Med List Status: <None>   Medication Order Taking? Sig Documenting Provider Last Dose Status Informant  acetaminophen  (TYLENOL ) 500 MG tablet [id number] Yes Take 1,000 mg by mouth every 6 (six) hours as needed for moderate pain. [provider]  Active Child, Pharmacy Records  amLODipine  (NORVASC ) 5 MG tablet [id number] Yes TAKE 1 TABLET BY MOUTH DAILY Theophilus Andrews, Tully GRADE, MD  Active Child, Pharmacy Records  amoxicillin -clavulanate (AUGMENTIN) 500-125 MG tablet [id number]  Take 1 tablet by mouth 2 (two) times daily for 3 days.  Patient not taking: Reported on 07/12/2023   Vernon Ranks, MD  Active   aspirin  EC 81 MG tablet [id number] Yes Take 81 mg by mouth every evening. Swallow whole. [provider]  Active  Child, Pharmacy Records  cetirizine (ZYRTEC) 10 MG tablet [id number] Yes Take 10 mg by mouth daily as needed for allergies. [provider]  Active Child, Pharmacy Records  clopidogrel  (PLAVIX ) 75 MG tablet [id number] Yes TAKE 1 TABLET BY MOUTH ONCE  DAILY Burnard Debby LABOR, MD  Active Child, Pharmacy Records  Coenzyme Q10 200 MG capsule [id number] Yes Take 200 mg by mouth daily. [provider]  Active Child, Pharmacy Records  cyanocobalamin  (VITAMIN B12) 1000 MCG/ML injection [id number] Yes INJECT 1 ML SUBCUTANEOUSLY ONCE EVERY MONTH Theophilus Andrews, Tully GRADE, MD  Active Child, Pharmacy Records  ezetimibe  (ZETIA ) 10 MG tablet [id number] Yes TAKE 1 TABLET BY MOUTH DAILY Theophilus Andrews, Tully GRADE, MD  Active Child, Pharmacy Records  Fe Bisgly-Vit C-Vit B12-FA (GENTLE IRON PO) [id number] Yes Take 1 tablet by mouth daily. [provider]  Active Child, Pharmacy Records  fluticasone  (FLONASE ) 50 MCG/ACT nasal spray [id number] Yes Place 1 spray into both nostrils daily as needed for allergies. [provider]  Active Child, Pharmacy Records  Fluticasone -Umeclidin-Vilant (TRELEGY ELLIPTA ) 200-62.5-25 MCG/ACT AEPB [id number] Yes Inhale 1 puff into the lungs daily. Hunsucker, Donnice SAUNDERS, MD  Active Child, Pharmacy Records  isosorbide  mononitrate (IMDUR ) 60 MG 24 hr tablet [id number] Yes TAKE 1 TABLET BY MOUTH DAILY Burnard Debby LABOR, MD  Active Child, Pharmacy Records  Menthol, Topical Analgesic, [facility] EX) [id number] Yes Apply 1 application topically daily as needed (pain). [provider]  Active Child, Pharmacy Records  METAMUCIL FIBER PO [id number] Yes Take 1 capsule by mouth in the morning. [provider]  Active Child, Pharmacy Records  Multiple Vitamin (MULTIVITAMIN) tablet [id number] Yes Take 1 tablet by mouth daily. [provider]  Active Child,  Pharmacy Records  Naphazoline-Pheniramine (OPCON-A) 0.027-0.315 % SOLN [id number] Yes Place 1 drop into both  eyes daily as needed (itching eyes). [provider]  Active Child, Pharmacy Records  nebivolol  (BYSTOLIC ) 10 MG tablet [id number] Yes TAKE 1 TABLET BY MOUTH DAILY Burnard Debby LABOR, MD  Active Child, Pharmacy Records  NEEDLE, DISP, 25 G (B-D DISP NEEDLE 25GX1) 25G X 1 MISC [id number] Yes Inject 1000 mcg into muscle once a month. Theophilus Andrews, Tully GRADE, MD  Active Child, Pharmacy Records  nitroGLYCERIN  (NITROSTAT ) 0.4 MG SL tablet [id number]  PLACE 1 TABLET UNDER THE TONGUE EVERY 5 MINUTES AS NEEDED FOR CHEST PAIN  Patient not taking: Reported on 07/12/2023   Theophilus Andrews, Tully GRADE, MD  Active Child, Pharmacy Records  oxyCODONE -acetaminophen  (PERCOCET) 10-325 MG tablet [id number] Yes Take 1 tablet by mouth 2 (two) times daily. Theophilus Andrews, Tully GRADE, MD  Active Child, Pharmacy Records  pantoprazole  (PROTONIX ) 40 MG tablet [id number] Yes TAKE 1 TABLET BY MOUTH ONCE  DAILY Kennedy-Smith, Colleen M, NP  Active Child, Pharmacy Records  polyethylene glycol powder (GLYCOLAX/MIRALAX) 17 GM/SCOOP powder [id number]  Take 1 Container by mouth daily. As needed  Patient not taking: Reported on 07/12/2023   [provider]  Active Child, Pharmacy Records  rOPINIRole  (REQUIP ) 2 MG tablet [id number] Yes TAKE 1 TABLET BY MOUTH AT  BEDTIME Theophilus Andrews, Tully GRADE, MD  Active Child, Pharmacy Records  rosuvastatin  (CRESTOR ) 10 MG tablet [id number] Yes TAKE 1 TABLET BY MOUTH DAILY Burnard Debby LABOR, MD  Active Child, Pharmacy Records  saccharomyces boulardii [facility]) 250 MG capsule [id number] Yes Take 250 mg by mouth daily. [provider]  Active Child, Pharmacy Records  vancomycin  (VANCOCIN ) 125 MG capsule [id number] Yes Take 1 capsule (125 mg total) by mouth 4 (four) times daily for 8 days. Vernon Ranks, MD  Active   venlafaxine  (EFFEXOR ) 75 MG tablet [id number] Yes TAKE 1 TABLET BY MOUTH DAILY Theophilus Andrews, Tully GRADE, MD  Active Child, Pharmacy Records            Home Care  and Equipment/Supplies: Were Home Health Services Ordered?: Yes Name of Home Health Agency:: [facility] Has Agency set up a time to come to your home?: No EMR reviewed for Home Health Orders:  (Home health has not called yet.) Any new equipment or medical supplies ordered?: No  Functional Questionnaire: Do you need assistance with bathing/showering or dressing?: No Do you need assistance with meal preparation?: Yes (family) Do you need assistance with eating?: No Do you have difficulty maintaining continence: Yes (h/o incontience-  no changes.) Do you need assistance with getting out of bed/getting out of a chair/moving?: No Do you have difficulty managing or taking your medications?: Yes (daughter manages.)  Follow up appointments reviewed: PCP Follow-up appointment confirmed?: Yes Date of PCP follow-up appointment?: 07/20/23 Follow-up Provider: [facility] Follow-up appointment confirmed?: Yes Date of Specialist follow-up appointment?: 07/21/23 Follow-Up Specialty Provider:: Vascular Do you need transportation to your follow-up appointment?: No Do you understand care options if your condition(s) worsen?: Yes-patient verbalized understanding  SDOH Interventions Today    Flowsheet Row Most Recent Value  SDOH Interventions   Food Insecurity Interventions Intervention Not Indicated  Housing Interventions Intervention Not Indicated  Transportation Interventions Intervention Not Indicated  Utilities Interventions Intervention Not Indicated   Spoke with daughter, Leeroy for this post discharge call. Daughter reports that she is concerned about her fathers mental health.  Daughter reports that patient is never happy, depressed everyday, complains daily.  Daughter reports concerns for pain medication use.  Daughter manages all medication except pain medications.  Patient refuses to allow daughter to manage pain medications.  Daughter who is a Engineer, civil (consulting) lives down the road  and goes to see patient multiple times per day.  [facility] has not started yet. Reviewed EMR , noted referral for [facility].  Provide daughter with phone and number and she states she will call to inquire when someone is coming.  Daughter has already sent a my chart message to PCP about her concerns and PCP will address at hospital follow up appointment.  Reviewed and offered 30 day TOC program and daughter declined. Provided my contact information for daughter to call me if needed.   Reviewed importance of hydration. Reviewed importance of taking all medications as prescribed.  Alan Ee, RN, BSN, CEN [employer]- Transition of Care Team.  [facility] [phone]

## 2023-07-13 ENCOUNTER — Other Ambulatory Visit (HOSPITAL_COMMUNITY)

## 2023-07-14 ENCOUNTER — Telehealth: Payer: Self-pay

## 2023-07-14 NOTE — Telephone Encounter (Signed)
 Toc made already on pt

## 2023-07-15 DIAGNOSIS — G8929 Other chronic pain: Secondary | ICD-10-CM | POA: Diagnosis not present

## 2023-07-15 DIAGNOSIS — Z7982 Long term (current) use of aspirin: Secondary | ICD-10-CM | POA: Diagnosis not present

## 2023-07-15 DIAGNOSIS — I2510 Atherosclerotic heart disease of native coronary artery without angina pectoris: Secondary | ICD-10-CM | POA: Diagnosis not present

## 2023-07-15 DIAGNOSIS — Z7951 Long term (current) use of inhaled steroids: Secondary | ICD-10-CM | POA: Diagnosis not present

## 2023-07-15 DIAGNOSIS — R1310 Dysphagia, unspecified: Secondary | ICD-10-CM | POA: Diagnosis not present

## 2023-07-15 DIAGNOSIS — N184 Chronic kidney disease, stage 4 (severe): Secondary | ICD-10-CM | POA: Diagnosis not present

## 2023-07-15 DIAGNOSIS — Z9181 History of falling: Secondary | ICD-10-CM | POA: Diagnosis not present

## 2023-07-15 DIAGNOSIS — M545 Low back pain, unspecified: Secondary | ICD-10-CM | POA: Diagnosis not present

## 2023-07-15 DIAGNOSIS — N209 Urinary calculus, unspecified: Secondary | ICD-10-CM | POA: Diagnosis not present

## 2023-07-15 DIAGNOSIS — I700 Atherosclerosis of aorta: Secondary | ICD-10-CM | POA: Diagnosis not present

## 2023-07-15 DIAGNOSIS — I129 Hypertensive chronic kidney disease with stage 1 through stage 4 chronic kidney disease, or unspecified chronic kidney disease: Secondary | ICD-10-CM | POA: Diagnosis not present

## 2023-07-15 DIAGNOSIS — E785 Hyperlipidemia, unspecified: Secondary | ICD-10-CM | POA: Diagnosis not present

## 2023-07-15 DIAGNOSIS — N179 Acute kidney failure, unspecified: Secondary | ICD-10-CM | POA: Diagnosis not present

## 2023-07-15 DIAGNOSIS — Z85828 Personal history of other malignant neoplasm of skin: Secondary | ICD-10-CM | POA: Diagnosis not present

## 2023-07-15 DIAGNOSIS — Z7902 Long term (current) use of antithrombotics/antiplatelets: Secondary | ICD-10-CM | POA: Diagnosis not present

## 2023-07-15 DIAGNOSIS — K222 Esophageal obstruction: Secondary | ICD-10-CM | POA: Diagnosis not present

## 2023-07-15 DIAGNOSIS — E039 Hypothyroidism, unspecified: Secondary | ICD-10-CM | POA: Diagnosis not present

## 2023-07-15 DIAGNOSIS — J440 Chronic obstructive pulmonary disease with acute lower respiratory infection: Secondary | ICD-10-CM | POA: Diagnosis not present

## 2023-07-18 ENCOUNTER — Other Ambulatory Visit: Payer: Self-pay | Admitting: Internal Medicine

## 2023-07-19 DIAGNOSIS — C678 Malignant neoplasm of overlapping sites of bladder: Secondary | ICD-10-CM | POA: Diagnosis not present

## 2023-07-20 ENCOUNTER — Encounter: Payer: Self-pay | Admitting: Internal Medicine

## 2023-07-20 ENCOUNTER — Ambulatory Visit: Admitting: Internal Medicine

## 2023-07-20 VITALS — BP 110/60 | HR 67 | Temp 98.2°F | Wt 156.9 lb

## 2023-07-20 DIAGNOSIS — Z7902 Long term (current) use of antithrombotics/antiplatelets: Secondary | ICD-10-CM | POA: Diagnosis not present

## 2023-07-20 DIAGNOSIS — E119 Type 2 diabetes mellitus without complications: Secondary | ICD-10-CM

## 2023-07-20 DIAGNOSIS — N179 Acute kidney failure, unspecified: Secondary | ICD-10-CM | POA: Diagnosis not present

## 2023-07-20 DIAGNOSIS — G8929 Other chronic pain: Secondary | ICD-10-CM | POA: Diagnosis not present

## 2023-07-20 DIAGNOSIS — I10 Essential (primary) hypertension: Secondary | ICD-10-CM

## 2023-07-20 DIAGNOSIS — R1310 Dysphagia, unspecified: Secondary | ICD-10-CM | POA: Diagnosis not present

## 2023-07-20 DIAGNOSIS — Z09 Encounter for follow-up examination after completed treatment for conditions other than malignant neoplasm: Secondary | ICD-10-CM

## 2023-07-20 DIAGNOSIS — Z7982 Long term (current) use of aspirin: Secondary | ICD-10-CM | POA: Diagnosis not present

## 2023-07-20 DIAGNOSIS — I700 Atherosclerosis of aorta: Secondary | ICD-10-CM | POA: Diagnosis not present

## 2023-07-20 DIAGNOSIS — Z9181 History of falling: Secondary | ICD-10-CM | POA: Diagnosis not present

## 2023-07-20 DIAGNOSIS — M542 Cervicalgia: Secondary | ICD-10-CM

## 2023-07-20 DIAGNOSIS — E538 Deficiency of other specified B group vitamins: Secondary | ICD-10-CM

## 2023-07-20 DIAGNOSIS — I2510 Atherosclerotic heart disease of native coronary artery without angina pectoris: Secondary | ICD-10-CM | POA: Diagnosis not present

## 2023-07-20 DIAGNOSIS — Z7951 Long term (current) use of inhaled steroids: Secondary | ICD-10-CM | POA: Diagnosis not present

## 2023-07-20 DIAGNOSIS — E038 Other specified hypothyroidism: Secondary | ICD-10-CM | POA: Diagnosis not present

## 2023-07-20 DIAGNOSIS — K222 Esophageal obstruction: Secondary | ICD-10-CM | POA: Diagnosis not present

## 2023-07-20 DIAGNOSIS — E785 Hyperlipidemia, unspecified: Secondary | ICD-10-CM | POA: Diagnosis not present

## 2023-07-20 DIAGNOSIS — Z85828 Personal history of other malignant neoplasm of skin: Secondary | ICD-10-CM | POA: Diagnosis not present

## 2023-07-20 DIAGNOSIS — A0472 Enterocolitis due to Clostridium difficile, not specified as recurrent: Secondary | ICD-10-CM

## 2023-07-20 DIAGNOSIS — J440 Chronic obstructive pulmonary disease with acute lower respiratory infection: Secondary | ICD-10-CM | POA: Diagnosis not present

## 2023-07-20 DIAGNOSIS — N209 Urinary calculus, unspecified: Secondary | ICD-10-CM | POA: Diagnosis not present

## 2023-07-20 DIAGNOSIS — E039 Hypothyroidism, unspecified: Secondary | ICD-10-CM | POA: Diagnosis not present

## 2023-07-20 DIAGNOSIS — M545 Low back pain, unspecified: Secondary | ICD-10-CM | POA: Diagnosis not present

## 2023-07-20 DIAGNOSIS — N184 Chronic kidney disease, stage 4 (severe): Secondary | ICD-10-CM | POA: Diagnosis not present

## 2023-07-20 DIAGNOSIS — I129 Hypertensive chronic kidney disease with stage 1 through stage 4 chronic kidney disease, or unspecified chronic kidney disease: Secondary | ICD-10-CM | POA: Diagnosis not present

## 2023-07-20 LAB — BASIC METABOLIC PANEL WITH GFR
BUN: 36 mg/dL — ABNORMAL HIGH (ref 6–23)
CO2: 28 meq/L (ref 19–32)
Calcium: 8.8 mg/dL (ref 8.4–10.5)
Chloride: 105 meq/L (ref 96–112)
Creatinine, Ser: 2.53 mg/dL — ABNORMAL HIGH (ref 0.40–1.50)
GFR: 23.23 mL/min — ABNORMAL LOW (ref >60.00)
Glucose, Bld: 135 mg/dL — ABNORMAL HIGH (ref 70–99)
Potassium: 4.8 meq/L (ref 3.5–5.1)
Sodium: 139 meq/L (ref 135–145)

## 2023-07-20 LAB — MICROALBUMIN / CREATININE URINE RATIO
Creatinine,U: 128.1 mg/dL
Microalb Creat Ratio: 230.5 mg/g — ABNORMAL HIGH (ref 0.0–30.0)
Microalb, Ur: 29.5 mg/dL — ABNORMAL HIGH (ref 0.0–1.9)

## 2023-07-20 LAB — POCT GLYCOSYLATED HEMOGLOBIN (HGB A1C): Hemoglobin A1C: 5.7 % — AB (ref 4.0–5.6)

## 2023-07-20 LAB — CBC WITH DIFFERENTIAL/PLATELET
Basophils Absolute: 0 10*3/uL (ref 0.0–0.1)
Basophils Relative: 0.8 % (ref 0.0–3.0)
Eosinophils Absolute: 0.1 10*3/uL (ref 0.0–0.7)
Eosinophils Relative: 1.5 % (ref 0.0–5.0)
HCT: 30.9 % — ABNORMAL LOW (ref 39.0–52.0)
Hemoglobin: 10.3 g/dL — ABNORMAL LOW (ref 13.0–17.0)
Lymphocytes Relative: 24.2 % (ref 12.0–46.0)
Lymphs Abs: 1.5 10*3/uL (ref 0.7–4.0)
MCHC: 33.3 g/dL (ref 30.0–36.0)
MCV: 83.8 fl (ref 78.0–100.0)
Monocytes Absolute: 0.8 10*3/uL (ref 0.1–1.0)
Monocytes Relative: 12.7 % — ABNORMAL HIGH (ref 3.0–12.0)
Neutro Abs: 3.9 10*3/uL (ref 1.4–7.7)
Neutrophils Relative %: 60.8 % (ref 43.0–77.0)
Platelets: 242 10*3/uL (ref 150.0–400.0)
RBC: 3.69 Mil/uL — ABNORMAL LOW (ref 4.22–5.81)
RDW: 14 % (ref 11.5–15.5)
WBC: 6.3 10*3/uL (ref 4.0–10.5)

## 2023-07-20 MED ORDER — CYCLOBENZAPRINE HCL 5 MG PO TABS
5.0000 mg | ORAL_TABLET | Freq: Every evening | ORAL | 0 refills | Status: AC | PRN
Start: 2023-07-20 — End: ?

## 2023-07-20 MED ORDER — CYANOCOBALAMIN 1000 MCG/ML IJ SOLN: 1000.0000 ug | INTRAMUSCULAR | 11 refills | Status: AC

## 2023-07-20 MED ORDER — NITROGLYCERIN 0.4 MG SL SUBL: SUBLINGUAL_TABLET | SUBLINGUAL | 0 refills | Status: DC

## 2023-07-20 MED ORDER — BD DISP NEEDLE 25G X 1" MISC: 0 refills | Status: DC

## 2023-07-20 MED ORDER — VENLAFAXINE HCL 100 MG PO TABS
100.0000 mg | ORAL_TABLET | Freq: Every day | ORAL | 1 refills | Status: DC
Start: 2023-07-20 — End: 2023-12-01

## 2023-07-20 NOTE — Progress Notes (Signed)
 Hospital follow-up visit     CC/Reason for Visit: Hospitalization follow-up  HPI: [name] is a 83 y.o. male who is coming in today for the above mentioned reasons, specifically transitional care services face-to-face visit.    Dates hospitalized: 07/07/23-07/10/2023 Days since discharge from hospital: 11 Patient was discharged from the hospital to: Home Reason for admission to hospital: Diarrhea Date of interactive phone contact with patient and/or caregiver: 07/12/2023  I have reviewed in detail patient's discharge summary plus pertinent specific notes, labs, and images from the hospitalization.  Yes  Patient was admitted due to diarrhea and dehydration causing acute renal failure.  He was found to have C. difficile diarrhea as well as enteropathogenic E. coli diarrhea.  He was treated with antibiotic therapy.  Was found to have an incidental pneumonia.  He has started physical therapy, has completed antibiotic therapy.  He continues to feel very weak and depressed.  Daughter is here with him today.  Her main concern is his ongoing depression.  Medication reconciliation was done today and patient is taking meds as recommended by discharging hospitalist/specialist.  Yes   Past Medical/Surgical History: Past Medical History:  Diagnosis Date   Allergy     Anxiety    B12 deficiency anemia    Blood transfusion without reported diagnosis    CAD (coronary artery disease)    Cancer (HCC)    bladder, Right renal cell also   Chronic kidney disease    Colon polyps    COPD (chronic obstructive pulmonary disease) (HCC)    Depression    Diabetes mellitus (HCC)    Dyspnea    Esophagus, Barrett's    GERD (gastroesophageal reflux disease)    Headache    History of bladder cancer    Bladder cancer 8 times   History of hiatal hernia    Hyperlipidemia    Hypertension    Localized osteoarthrosis, lower leg    Myocardial infarction (HCC) 2021   Pneumonia    Restless leg  syndrome    Sleep apnea    does not wear cpap   Stenosis of esophagus     Past Surgical History:  Procedure Laterality Date   BILIARY BRUSHING  04/01/2018   Procedure: BILIARY BRUSHING;  Surgeon: Wilhelmenia Aloha Raddle., MD;  Location: [facility] ENDOSCOPY;  Service: Gastroenterology;;   BILIARY BRUSHING  09/12/2018   Procedure: BILIARY BRUSHING;  Surgeon: Wilhelmenia Aloha Raddle., MD;  Location: [facility] ENDOSCOPY;  Service: Gastroenterology;;   BILIARY BRUSHING  11/28/2018   Procedure: BILIARY BRUSHING;  Surgeon: Wilhelmenia Aloha Raddle., MD;  Location: [facility] ENDOSCOPY;  Service: Gastroenterology;;   BILIARY BRUSHING  03/11/2020   Procedure: BILIARY BRUSHING;  Surgeon: Wilhelmenia Aloha Raddle., MD;  Location: [facility] ENDOSCOPY;  Service: Gastroenterology;;   BILIARY DILATION  09/12/2018   Procedure: BILIARY DILATION;  Surgeon: Wilhelmenia Aloha Raddle., MD;  Location: [facility] ENDOSCOPY;  Service: Gastroenterology;;   BILIARY DILATION  11/28/2018   Procedure: BILIARY DILATION;  Surgeon: Wilhelmenia Aloha Raddle., MD;  Location: [facility] ENDOSCOPY;  Service: Gastroenterology;;   BILIARY DILATION  03/11/2020   Procedure: BILIARY DILATION;  Surgeon: Wilhelmenia Aloha Raddle., MD;  Location: [facility] ENDOSCOPY;  Service: Gastroenterology;;   BILIARY STENT PLACEMENT  04/01/2018   Procedure: BILIARY STENT PLACEMENT;  Surgeon: Wilhelmenia Aloha Raddle., MD;  Location: [facility] ENDOSCOPY;  Service: Gastroenterology;;   BILIARY STENT PLACEMENT  09/12/2018   Procedure: BILIARY STENT PLACEMENT;  Surgeon: Wilhelmenia Aloha Raddle., MD;  Location: [facility] ENDOSCOPY;  Service: Gastroenterology;;   BILIARY STENT PLACEMENT  11/28/2018   Procedure: BILIARY STENT PLACEMENT;  Surgeon: Wilhelmenia Aloha Raddle., MD;  Location: [facility] ENDOSCOPY;  Service: Gastroenterology;;   BIOPSY  04/01/2018   Procedure: BIOPSY;  Surgeon: Wilhelmenia Aloha Raddle., MD;  Location: [facility] ENDOSCOPY;  Service: Gastroenterology;;   BIOPSY  09/12/2018   Procedure: BIOPSY;  Surgeon: Wilhelmenia Aloha Raddle., MD;  Location: [facility] ENDOSCOPY;  Service: Gastroenterology;;   BIOPSY  11/28/2018   Procedure: BIOPSY;  Surgeon: Wilhelmenia Aloha Raddle., MD;  Location: [facility] ENDOSCOPY;  Service: Gastroenterology;;   BIOPSY  03/11/2020   Procedure: BIOPSY;  Surgeon: Wilhelmenia Aloha Raddle., MD;  Location: [facility] ENDOSCOPY;  Service: Gastroenterology;;   bladder cancer      x 8 cystoscopy   CERVICAL DISCECTOMY     ACDF   CHOLECYSTECTOMY     COLONOSCOPY  11/17/2005   normal    CORONARY ARTERY BYPASS GRAFT     x4   CORONARY STENT INTERVENTION N/A 01/05/2019   Procedure: CORONARY STENT INTERVENTION;  Surgeon: Mady Bruckner, MD;  Location: [facility] INVASIVE CV LAB;  Service: Cardiovascular;  Laterality: N/A;   CYSTOSCOPY WITH BIOPSY N/A 03/27/2022   Procedure: CYSTOSCOPY WITH BIOPSY;  Surgeon: Watt Rush, MD;  Location: [facility] ORS;  Service: Urology;  Laterality: N/A;   CYSTOSCOPY WITH RETROGRADE PYELOGRAM, URETEROSCOPY AND STENT PLACEMENT Right 03/27/2022   Procedure: CYSTOSCOPY WITH RIGHT RETROGRADE PYELOGRAM, URETEROSCOPY AND STENT PLACEMENT urethral dilation;  Surgeon: Watt Rush, MD;  Location: [facility] ORS;  Service: Urology;  Laterality: Right;   ENDOSCOPIC MUCOSAL RESECTION  09/12/2018   Procedure: ENDOSCOPIC MUCOSAL RESECTION;  Surgeon: Wilhelmenia Aloha Raddle., MD;  Location: [facility] ENDOSCOPY;  Service: Gastroenterology;;   ENDOSCOPIC RETROGRADE CHOLANGIOPANCREATOGRAPHY (ERCP) WITH PROPOFOL  N/A 04/01/2018   Procedure: ENDOSCOPIC RETROGRADE CHOLANGIOPANCREATOGRAPHY (ERCP) WITH PROPOFOL ;  Surgeon: Wilhelmenia Aloha Raddle., MD;  Location: [facility] ENDOSCOPY;  Service: Gastroenterology;  Laterality: N/A;   ENDOSCOPIC RETROGRADE CHOLANGIOPANCREATOGRAPHY (ERCP) WITH PROPOFOL  N/A 09/12/2018   Procedure: ENDOSCOPIC RETROGRADE CHOLANGIOPANCREATOGRAPHY (ERCP) WITH PROPOFOL ;  Surgeon: Wilhelmenia Aloha Raddle., MD;  Location: [facility] ENDOSCOPY;  Service: Gastroenterology;  Laterality: N/A;   ENDOSCOPIC RETROGRADE CHOLANGIOPANCREATOGRAPHY  (ERCP) WITH PROPOFOL  N/A 03/11/2020   Procedure: ENDOSCOPIC RETROGRADE CHOLANGIOPANCREATOGRAPHY (ERCP) WITH PROPOFOL ;  Surgeon: Wilhelmenia Aloha Raddle., MD;  Location: [facility] ENDOSCOPY;  Service: Gastroenterology;  Laterality: N/A;   ENDOSCOPIC RETROGRADE CHOLANGIOPANCREATOGRAPHY (ERCP) WITH PROPOFOL  N/A 06/03/2020   Procedure: ENDOSCOPIC RETROGRADE CHOLANGIOPANCREATOGRAPHY (ERCP) WITH PROPOFOL ;  Surgeon: Wilhelmenia Aloha Raddle., MD;  Location: [facility] ENDOSCOPY;  Service: Gastroenterology;  Laterality: N/A;   ERCP N/A 11/28/2018   Procedure: ENDOSCOPIC RETROGRADE CHOLANGIOPANCREATOGRAPHY (ERCP) +EGD with spyglass;  Surgeon: Wilhelmenia Aloha Raddle., MD;  Location: [facility] ENDOSCOPY;  Service: Gastroenterology;  Laterality: N/A;   ESOPHAGOGASTRODUODENOSCOPY  04/29/2010   ESOPHAGOGASTRODUODENOSCOPY (EGD) WITH PROPOFOL  N/A 04/01/2018   Procedure: ESOPHAGOGASTRODUODENOSCOPY (EGD) WITH PROPOFOL ;  Surgeon: Wilhelmenia Aloha Raddle., MD;  Location: [facility] ENDOSCOPY;  Service: Gastroenterology;  Laterality: N/A;   ESOPHAGOGASTRODUODENOSCOPY (EGD) WITH PROPOFOL  N/A 09/12/2018   Procedure: ESOPHAGOGASTRODUODENOSCOPY (EGD) WITH PROPOFOL ;  Surgeon: Wilhelmenia Aloha Raddle., MD;  Location: [facility] ENDOSCOPY;  Service: Gastroenterology;  Laterality: N/A;   ESOPHAGOGASTRODUODENOSCOPY (EGD) WITH PROPOFOL  N/A 11/28/2018   Procedure: ESOPHAGOGASTRODUODENOSCOPY (EGD) WITH PROPOFOL ;  Surgeon: Wilhelmenia Aloha Raddle., MD;  Location: [facility] ENDOSCOPY;  Service: Gastroenterology;  Laterality: N/A;   ESOPHAGOGASTRODUODENOSCOPY (EGD) WITH PROPOFOL  N/A 03/11/2020   Procedure: ESOPHAGOGASTRODUODENOSCOPY (EGD) WITH PROPOFOL ;  Surgeon: Wilhelmenia Aloha Raddle., MD;  Location: [facility] ENDOSCOPY;  Service: Gastroenterology;  Laterality: N/A;   ESOPHAGOGASTRODUODENOSCOPY (EGD) WITH PROPOFOL  N/A 06/03/2020   Procedure: ESOPHAGOGASTRODUODENOSCOPY (EGD) WITH PROPOFOL ;  Surgeon: Wilhelmenia Aloha  Mickey., MD;  Location: [facility] ENDOSCOPY;  Service: Gastroenterology;   Laterality: N/A;   EUS  04/01/2018   Procedure: FULL UPPER ENDOSCOPIC ULTRASOUND (EUS) RADIAL;  Surgeon: Wilhelmenia Aloha Mickey., MD;  Location: [facility] ENDOSCOPY;  Service: Gastroenterology;;   EUS N/A 09/12/2018   Procedure: UPPER ENDOSCOPIC ULTRASOUND (EUS) RADIAL;  Surgeon: Wilhelmenia Aloha Mickey., MD;  Location: [facility] ENDOSCOPY;  Service: Gastroenterology;  Laterality: N/A;   FINE NEEDLE ASPIRATION  09/12/2018   Procedure: FINE NEEDLE ASPIRATION (FNA) LINEAR;  Surgeon: Wilhelmenia Aloha Mickey., MD;  Location: [facility] ENDOSCOPY;  Service: Gastroenterology;;   HAND SURGERY Left 2018   saw accident   HEMOSTASIS CLIP PLACEMENT  09/12/2018   Procedure: HEMOSTASIS CLIP PLACEMENT;  Surgeon: Wilhelmenia Aloha Mickey., MD;  Location: [facility] ENDOSCOPY;  Service: Gastroenterology;;   HEMOSTASIS CLIP PLACEMENT  06/03/2020   Procedure: HEMOSTASIS CLIP PLACEMENT;  Surgeon: Wilhelmenia Aloha Mickey., MD;  Location: [facility] ENDOSCOPY;  Service: Gastroenterology;;   I & D EXTREMITY Left 11/24/2016   Procedure: IRRIGATION AND DEBRIDEMENT LEFT HAND, THUMB, INDEX, MIDDLE, RING, AND SMALL FINGERS WITH RECONSTRUCTION;  Surgeon: Camella Fallow, MD;  Location: [facility] OR;  Service: Orthopedics;  Laterality: Left;   KNEE ARTHROSCOPY Left    LEFT HEART CATH AND CORS/GRAFTS ANGIOGRAPHY N/A 01/05/2019   Procedure: LEFT HEART CATH AND CORS/GRAFTS ANGIOGRAPHY;  Surgeon: Mady Bruckner, MD;  Location: [facility] INVASIVE CV LAB;  Service: Cardiovascular;  Laterality: N/A;   LUMBAR LAMINECTOMY     and fusion x 2   NASAL SINUS SURGERY     POPLITEAL SYNOVIAL CYST EXCISION     REMOVAL OF STONES  04/01/2018   Procedure: REMOVAL OF STONES;  Surgeon: Wilhelmenia Aloha Mickey., MD;  Location: [facility] ENDOSCOPY;  Service: Gastroenterology;;   REMOVAL OF STONES  09/12/2018   Procedure: REMOVAL OF STONES;  Surgeon: Wilhelmenia Aloha Mickey., MD;  Location: [facility] ENDOSCOPY;  Service: Gastroenterology;;   REMOVAL OF STONES  11/28/2018   Procedure: REMOVAL OF STONES;  Surgeon:  Wilhelmenia Aloha Mickey., MD;  Location: [facility] ENDOSCOPY;  Service: Gastroenterology;;   REMOVAL OF STONES  03/11/2020   Procedure: REMOVAL OF STONES;  Surgeon: Wilhelmenia Aloha Mickey., MD;  Location: [facility] ENDOSCOPY;  Service: Gastroenterology;;   REMOVAL OF STONES  06/03/2020   Procedure: REMOVAL OF STONES;  Surgeon: Wilhelmenia Aloha Mickey., MD;  Location: [facility] ENDOSCOPY;  Service: Gastroenterology;;   ROBOT ASSITED LAPAROSCOPIC NEPHROURETERECTOMY Right 06/03/2022   Procedure: XI ROBOT ASSITED LAPAROSCOPIC NEPHROURETERECTOMY, FLEXIBLE CYSTOSCOPY;  Surgeon: Alvaro Ricardo KATHEE Mickey., MD;  Location: [facility] ORS;  Service: Urology;  Laterality: Right;  3 HRS   SAVORY DILATION N/A 09/12/2018   Procedure: SAVORY DILATION;  Surgeon: Wilhelmenia Aloha Mickey., MD;  Location: [facility] ENDOSCOPY;  Service: Gastroenterology;  Laterality: N/A;   SAVORY DILATION N/A 11/28/2018   Procedure: SAVORY DILATION;  Surgeon: Wilhelmenia Aloha Mickey., MD;  Location: [facility] ENDOSCOPY;  Service: Gastroenterology;  Laterality: N/A;   SEPTOPLASTY Bilateral 05/26/2021   Procedure: SEPTOPLASTY;  Surgeon: Karis Clunes, MD;  Location: [facility];  Service: ENT;  Laterality: Bilateral;   SPHINCTEROTOMY  04/01/2018   Procedure: ANNETT;  Surgeon: Mansouraty, Aloha Mickey., MD;  Location: [facility] ENDOSCOPY;  Service: Gastroenterology;;   LAHOMA CHOLANGIOSCOPY N/A 11/28/2018   Procedure: DEBHOJDD CHOLANGIOSCOPY;  Surgeon: Wilhelmenia Aloha Mickey., MD;  Location: [facility] ENDOSCOPY;  Service: Gastroenterology;  Laterality: N/A;   SPYGLASS CHOLANGIOSCOPY N/A 06/03/2020   Procedure: SPYGLASS CHOLANGIOSCOPY;  Surgeon: Wilhelmenia Aloha Mickey., MD;  Location: [facility] ENDOSCOPY;  Service: Gastroenterology;  Laterality: N/A;   STENT REMOVAL  09/12/2018  Procedure: STENT REMOVAL;  Surgeon: Wilhelmenia Aloha Raddle., MD;  Location: [facility] ENDOSCOPY;  Service: Gastroenterology;;   CLEDA REMOVAL  11/28/2018   Procedure: STENT REMOVAL;  Surgeon: Wilhelmenia Aloha Raddle., MD;   Location: [facility] ENDOSCOPY;  Service: Gastroenterology;;   CLEDA REMOVAL  03/11/2020   Procedure: STENT REMOVAL;  Surgeon: Wilhelmenia Aloha Raddle., MD;  Location: [facility] ENDOSCOPY;  Service: Gastroenterology;;   ROBLEY MEYER INJECTION  09/12/2018   Procedure: SUBMUCOSAL LIFTING INJECTION;  Surgeon: Wilhelmenia Aloha Raddle., MD;  Location: [facility] ENDOSCOPY;  Service: Gastroenterology;;    Social History:  reports that he quit smoking about 47 years ago. His smoking use included cigarettes. He started smoking about 52 years ago. He has a 2.5 pack-year smoking history. He has never used smokeless tobacco. He reports that he does not drink alcohol and does not use drugs.  Allergies: No Known Allergies  Family History:  Family History  Problem Relation Age of Onset   Melanoma Mother    Stroke Father    Hypertension Father    Coronary artery disease Other    Colon cancer Neg Hx    Esophageal cancer Neg Hx    Stomach cancer Neg Hx    Rectal cancer Neg Hx    Pancreatic cancer Neg Hx    Liver disease Neg Hx    Inflammatory bowel disease Neg Hx      Current Outpatient Medications:    acetaminophen  (TYLENOL ) 500 MG tablet, Take 1,000 mg by mouth every 6 (six) hours as needed for moderate pain., Disp: , Rfl:    amLODipine  (NORVASC ) 5 MG tablet, TAKE 1 TABLET BY MOUTH DAILY, Disp: 100 tablet, Rfl: 2   aspirin  EC 81 MG tablet, Take 81 mg by mouth every evening. Swallow whole., Disp: , Rfl:    cetirizine (ZYRTEC) 10 MG tablet, Take 10 mg by mouth daily as needed for allergies., Disp: , Rfl:    clopidogrel  (PLAVIX ) 75 MG tablet, TAKE 1 TABLET BY MOUTH ONCE  DAILY, Disp: 100 tablet, Rfl: 2   Coenzyme Q10 200 MG capsule, Take 200 mg by mouth daily., Disp: , Rfl:    cyclobenzaprine  (FLEXERIL ) 5 MG tablet, Take 1 tablet (5 mg total) by mouth at bedtime as needed for muscle spasms., Disp: 30 tablet, Rfl: 0   ezetimibe  (ZETIA ) 10 MG tablet, TAKE 1 TABLET BY MOUTH DAILY, Disp: 100 tablet, Rfl: 1   Fe  Bisgly-Vit C-Vit B12-FA (GENTLE IRON PO), Take 1 tablet by mouth daily., Disp: , Rfl:    fluticasone  (FLONASE ) 50 MCG/ACT nasal spray, Place 1 spray into both nostrils daily as needed for allergies., Disp: , Rfl:    Fluticasone -Umeclidin-Vilant (TRELEGY ELLIPTA ) 200-62.5-25 MCG/ACT AEPB, Inhale 1 puff into the lungs daily., Disp: 60 each, Rfl: 11   isosorbide  mononitrate (IMDUR ) 60 MG 24 hr tablet, TAKE 1 TABLET BY MOUTH DAILY, Disp: 100 tablet, Rfl: 2   Menthol, Topical Analgesic, (BENGAY EX), Apply 1 application topically daily as needed (pain)., Disp: , Rfl:    METAMUCIL FIBER PO, Take 1 capsule by mouth in the morning., Disp: , Rfl:    Multiple Vitamin (MULTIVITAMIN) tablet, Take 1 tablet by mouth daily., Disp: , Rfl:    Naphazoline-Pheniramine (OPCON-A) 0.027-0.315 % SOLN, Place 1 drop into both eyes daily as needed (itching eyes)., Disp: , Rfl:    nebivolol  (BYSTOLIC ) 10 MG tablet, TAKE 1 TABLET BY MOUTH DAILY, Disp: 90 tablet, Rfl: 2   oxyCODONE -acetaminophen  (PERCOCET) 10-325 MG tablet, Take 1 tablet by mouth 2 (two) times daily., Disp:  60 tablet, Rfl: 0   pantoprazole  (PROTONIX ) 40 MG tablet, TAKE 1 TABLET BY MOUTH ONCE  DAILY, Disp: 100 tablet, Rfl: 2   polyethylene glycol powder (GLYCOLAX/MIRALAX) 17 GM/SCOOP powder, Take 1 Container by mouth daily. As needed, Disp: , Rfl:    rOPINIRole  (REQUIP ) 2 MG tablet, TAKE 1 TABLET BY MOUTH AT  BEDTIME, Disp: 100 tablet, Rfl: 1   rosuvastatin  (CRESTOR ) 10 MG tablet, TAKE 1 TABLET BY MOUTH DAILY, Disp: 90 tablet, Rfl: 2   saccharomyces boulardii (FLORASTOR) 250 MG capsule, Take 250 mg by mouth daily., Disp: , Rfl:    cyanocobalamin  (VITAMIN B12) 1000 MCG/ML injection, Inject 1 mL (1,000 mcg total) into the muscle every 30 (thirty) days., Disp: 6 mL, Rfl: 11   NEEDLE, DISP, 25 G (B-D DISP NEEDLE 25GX1) 25G X 1 MISC, Inject 1000 mcg into muscle once a month., Disp: 50 each, Rfl: 0   nitroGLYCERIN  (NITROSTAT ) 0.4 MG SL tablet, PLACE 1 TABLET UNDER  THE TONGUE EVERY 5 MINUTES AS NEEDED FOR CHEST PAIN, Disp: 25 tablet, Rfl: 0   venlafaxine  (EFFEXOR ) 100 MG tablet, Take 1 tablet (100 mg total) by mouth daily., Disp: 90 tablet, Rfl: 1  Review of Systems:  Negative except as mentioned in HPI.    Physical Exam: Vitals:   07/20/23 0901  BP: 110/60  Pulse: 67  Temp: 98.2 F (36.8 C)  TempSrc: Oral  SpO2: 95%  Weight: 156 lb 14.4 oz (71.2 kg)    Body mass index is 23.17 kg/m.   Physical Exam Vitals reviewed.  Constitutional:      Appearance: Normal appearance.  HENT:     Head: Normocephalic and atraumatic.   Eyes:     Conjunctiva/sclera: Conjunctivae normal.    Cardiovascular:     Rate and Rhythm: Normal rate and regular rhythm.  Pulmonary:     Effort: Pulmonary effort is normal.     Breath sounds: Normal breath sounds.   Skin:    General: Skin is warm and dry.   Neurological:     General: No focal deficit present.     Mental Status: He is alert and oriented to person, place, and time.     Impression and Plan:  Diabetes mellitus type 2, noninsulin dependent (HCC) - Plan: POC HgB A1c, Urine microalbumin-creatinine with uACR, Urine microalbumin-creatinine with uACR  B12 deficiency - Plan: NEEDLE, DISP, 25 G (B-D DISP NEEDLE 25GX1) 25G X 1 MISC  Hospital discharge follow-up  Essential hypertension - Plan: Basic metabolic panel with GFR, CBC with Differential/Platelet, CBC with Differential/Platelet, Basic metabolic panel with GFR  AKI (acute kidney injury) (HCC)  C. difficile diarrhea  Cervicalgia - Plan: cyclobenzaprine  (FLEXERIL ) 5 MG tablet  Other specified hypothyroidism - Plan: venlafaxine  (EFFEXOR ) 100 MG tablet [facility] charts have been reviewed in detail. - A1c of 5.7 demonstrates excellent diabetic management. - Blood pressure is well-controlled. - He has completed antibiotic therapy. - Due to ongoing depression increase venlafaxine  from 75 to 100 mg.  I have advised CBT but he is  reluctant. - Check labs today to follow renal function and electrolytes.  Medical decision making of high complexity was utilized today.     Tully Theophilus Andrews, MD [place]

## 2023-07-21 ENCOUNTER — Ambulatory Visit: Payer: Self-pay | Admitting: Internal Medicine

## 2023-07-21 ENCOUNTER — Ambulatory Visit (HOSPITAL_COMMUNITY)
Admission: RE | Admit: 2023-07-21 | Discharge: 2023-07-21 | Disposition: A | Discharge status: Home / Self Care | Source: Ambulatory Visit | Attending: Vascular Surgery | Admitting: Vascular Surgery

## 2023-07-21 DIAGNOSIS — R1013 Epigastric pain: Secondary | ICD-10-CM | POA: Diagnosis not present

## 2023-07-26 ENCOUNTER — Other Ambulatory Visit: Payer: Self-pay

## 2023-07-26 ENCOUNTER — Ambulatory Visit: Attending: Surgery | Admitting: Surgery

## 2023-07-26 ENCOUNTER — Encounter: Payer: Self-pay | Admitting: Surgery

## 2023-07-26 VITALS — BP 129/64 | HR 60 | Temp 97.9°F | Ht 69.0 in | Wt 154.0 lb

## 2023-07-26 DIAGNOSIS — K551 Chronic vascular disorders of intestine: Secondary | ICD-10-CM | POA: Diagnosis not present

## 2023-07-26 NOTE — Progress Notes (Signed)
 [facility] [place]  Patient name: [name] MRN: [id number] DOB: 1940-08-18 Sex: male   REQUESTING PROVIDER:   Alan Coombs   REASON FOR CONSULT:    SMA stenosis  HISTORY OF PRESENT ILLNESS:    [name] is a 83 y.o. male, who is referred for mesenteric artery stenosis that was detected on ultrasound.  He was recently in the hospital for diarrhea which was ultimately treated for C. difficile.  Patient has a multiple year history of motility issues including diarrhea and constipation.  He states that approximately 15 minutes after he eats his stomach starts to ramble and he has to use the bathroom immediately.  He does not endorse any postprandial abdominal pain.  He has lost approximately 40 pounds in the past year  The patient has a history of coronary artery disease, status post CABG in 2001 and PCI in 2020.  He suffers from COPD secondary to former tobacco abuse.  He is a diabetic.SABRA  He has undergone right nephrectomy and has chronic renal insufficiency.  His most recent creatinine was 2.5.  He has a history of biliary stricture status post ERCP and stent placement, all of which have been removed.  He has had gallstone pancreatitis in the past.  He is status post cholecystectomy.  His last EGD and sigmoidoscopy was in 2020.    PAST MEDICAL HISTORY    Past Medical History:  Diagnosis Date   Allergy     Anxiety    B12 deficiency anemia    Blood transfusion without reported diagnosis    CAD (coronary artery disease)    Cancer (HCC)    bladder, Right renal cell also   Chronic kidney disease    Colon polyps    COPD (chronic obstructive pulmonary disease) (HCC)    Depression    Diabetes mellitus (HCC)    Dyspnea    Esophagus, Barrett's    GERD (gastroesophageal reflux disease)    Headache    History of bladder cancer    Bladder cancer 8 times   History of hiatal hernia    Hyperlipidemia     Hypertension    Localized osteoarthrosis, lower leg    Myocardial infarction (HCC) 2021   Pneumonia    Restless leg syndrome    Sleep apnea    does not wear cpap   Stenosis of esophagus      FAMILY HISTORY   Family History  Problem Relation Age of Onset   Melanoma Mother    Stroke Father    Hypertension Father    Coronary artery disease Other    Colon cancer Neg Hx    Esophageal cancer Neg Hx    Stomach cancer Neg Hx    Rectal cancer Neg Hx    Pancreatic cancer Neg Hx    Liver disease Neg Hx    Inflammatory bowel disease Neg Hx     SOCIAL HISTORY:   Social History   Socioeconomic History   Marital status: Widowed    Spouse name: Not on file   Number of children: Not on file   Years of education: Not on file   Highest education level: 12th grade  Occupational History   Not on file  Tobacco Use   Smoking status: Former    Current packs/day: 0.00    Average packs/day: 0.5 packs/day for 5.0 years (2.5 ttl pk-yrs)    Types: Cigarettes    Start date: 03/31/1971    Quit date: 03/30/1976  Years since quitting: 47.3   Smokeless tobacco: Never  Vaping Use   Vaping status: Never Used  Substance and Sexual Activity   Alcohol use: No    Alcohol/week: 0.0 standard drinks of alcohol   Drug use: No   Sexual activity: Yes  Other Topics Concern   Not on file  Social History Narrative   Not on file   Social Drivers of Health   Financial Resource Strain: Low Risk  (02/10/2023)   Overall Financial Resource Strain (CARDIA)    Difficulty of Paying Living Expenses: Not hard at all  Food Insecurity: No Food Insecurity (07/12/2023)   Hunger Vital Sign    Worried About Running Out of Food in the Last Year: Never true    Ran Out of Food in the Last Year: Never true  Transportation Needs: No Transportation Needs (07/12/2023)   PRAPARE - Administrator, Civil Service (Medical): No    Lack of Transportation (Non-Medical): No  Physical Activity: Unknown (02/10/2023)    Exercise Vital Sign    Days of Exercise per Week: 0 days    Minutes of Exercise per Session: Not on file  Recent Concern: Physical Activity - Inactive (02/10/2023)   Exercise Vital Sign    Days of Exercise per Week: 0 days    Minutes of Exercise per Session: 30 min  Stress: No Stress Concern Present (02/10/2023)   [employer] of Occupational Health - Occupational Stress Questionnaire    Feeling of Stress : Not at all  Social Connections: Moderately Integrated (07/08/2023)   Social Connection and Isolation Panel    Frequency of Communication with Friends and Family: More than three times a week    Frequency of Social Gatherings with Friends and Family: Twice a week    Attends Religious Services: More than 4 times per year    Active Member of [employer] or Organizations: Yes    Attends Banker Meetings: More than 4 times per year    Marital Status: Widowed  Intimate Partner Violence: Not At Risk (07/08/2023)   Humiliation, Afraid, Rape, and Kick questionnaire    Fear of Current or Ex-Partner: No    Emotionally Abused: No    Physically Abused: No    Sexually Abused: No    ALLERGIES:    No Known Allergies  CURRENT MEDICATIONS:    Current Outpatient Medications  Medication Sig Dispense Refill   acetaminophen  (TYLENOL ) 500 MG tablet Take 1,000 mg by mouth every 6 (six) hours as needed for moderate pain.     amLODipine  (NORVASC ) 5 MG tablet TAKE 1 TABLET BY MOUTH DAILY 100 tablet 2   aspirin  EC 81 MG tablet Take 81 mg by mouth every evening. Swallow whole.     cetirizine (ZYRTEC) 10 MG tablet Take 10 mg by mouth daily as needed for allergies.     clopidogrel  (PLAVIX ) 75 MG tablet TAKE 1 TABLET BY MOUTH ONCE  DAILY 100 tablet 2   Coenzyme Q10 200 MG capsule Take 200 mg by mouth daily.     cyanocobalamin  (VITAMIN B12) 1000 MCG/ML injection Inject 1 mL (1,000 mcg total) into the muscle every 30 (thirty) days. 6 mL 11   cyclobenzaprine  (FLEXERIL ) 5 MG tablet Take 1 tablet  (5 mg total) by mouth at bedtime as needed for muscle spasms. 30 tablet 0   ezetimibe  (ZETIA ) 10 MG tablet TAKE 1 TABLET BY MOUTH DAILY 100 tablet 1   Fe Bisgly-Vit C-Vit B12-FA (GENTLE IRON PO) Take 1 tablet by  mouth daily.     fluticasone  (FLONASE ) 50 MCG/ACT nasal spray Place 1 spray into both nostrils daily as needed for allergies.     Fluticasone -Umeclidin-Vilant (TRELEGY ELLIPTA ) 200-62.5-25 MCG/ACT AEPB Inhale 1 puff into the lungs daily. 60 each 11   isosorbide  mononitrate (IMDUR ) 60 MG 24 hr tablet TAKE 1 TABLET BY MOUTH DAILY 100 tablet 2   Menthol, Topical Analgesic, (BENGAY EX) Apply 1 application topically daily as needed (pain).     METAMUCIL FIBER PO Take 1 capsule by mouth in the morning.     Multiple Vitamin (MULTIVITAMIN) tablet Take 1 tablet by mouth daily.     Naphazoline-Pheniramine (OPCON-A) 0.027-0.315 % SOLN Place 1 drop into both eyes daily as needed (itching eyes).     nebivolol  (BYSTOLIC ) 10 MG tablet TAKE 1 TABLET BY MOUTH DAILY 90 tablet 2   NEEDLE, DISP, 25 G (B-D DISP NEEDLE 25GX1) 25G X 1 MISC Inject 1000 mcg into muscle once a month. 50 each 0   nitroGLYCERIN  (NITROSTAT ) 0.4 MG SL tablet PLACE 1 TABLET UNDER THE TONGUE EVERY 5 MINUTES AS NEEDED FOR CHEST PAIN 25 tablet 0   oxyCODONE -acetaminophen  (PERCOCET) 10-325 MG tablet Take 1 tablet by mouth 2 (two) times daily. 60 tablet 0   pantoprazole  (PROTONIX ) 40 MG tablet TAKE 1 TABLET BY MOUTH ONCE  DAILY 100 tablet 2   polyethylene glycol powder (GLYCOLAX/MIRALAX) 17 GM/SCOOP powder Take 1 Container by mouth daily. As needed     rOPINIRole  (REQUIP ) 2 MG tablet TAKE 1 TABLET BY MOUTH AT  BEDTIME 100 tablet 1   rosuvastatin  (CRESTOR ) 10 MG tablet TAKE 1 TABLET BY MOUTH DAILY 90 tablet 2   saccharomyces boulardii (FLORASTOR) 250 MG capsule Take 250 mg by mouth daily.     venlafaxine  (EFFEXOR ) 100 MG tablet Take 1 tablet (100 mg total) by mouth daily. 90 tablet 1   No current facility-administered medications for this  visit.    REVIEW OF SYSTEMS:   [X]  denotes positive finding, [ ]  denotes negative finding Cardiac  Comments:  Chest pain or chest pressure:    Shortness of breath upon exertion:    Short of breath when lying flat:    Irregular heart rhythm:        Vascular    Pain in calf, thigh, or hip brought on by ambulation:    Pain in feet at night that wakes you up from your sleep:     Blood clot in your veins:    Leg swelling:         Pulmonary    Oxygen at home:    Productive cough:     Wheezing:         Neurologic    Sudden weakness in arms or legs:     Sudden numbness in arms or legs:     Sudden onset of difficulty speaking or slurred speech:    Temporary loss of vision in one eye:     Problems with dizziness:         Gastrointestinal    Blood in stool:      Vomited blood:         Genitourinary    Burning when urinating:     Blood in urine:        Psychiatric    Major depression:         Hematologic    Bleeding problems:    Problems with blood clotting too easily:        Skin  Rashes or ulcers:        Constitutional    Fever or chills:     PHYSICAL EXAM:   Vitals:   07/26/23 1033  BP: 129/64  Pulse: 60  Temp: 97.9 F (36.6 C)  SpO2: 94%  Weight: 154 lb (69.9 kg)  Height: 5' 9 (1.753 m)    GENERAL: The patient is a well-nourished male, in no acute distress. The vital signs are documented above. CARDIAC: There is a regular rate and rhythm. PULMONARY: Nonlabored respirations ABDOMEN: Soft and non-tender   MUSCULOSKELETAL: There are no major deformities or cyanosis. NEUROLOGIC: No focal weakness or paresthesias are detected. SKIN: There are no ulcers or rashes noted. PSYCHIATRIC: The patient has a normal affect.  STUDIES:   I have reviewed the following ultrasound: Mesenteric:  70 to 99% stenosis in the superior mesenteric artery.  Note: This was a technically difficult exam.   ASSESSMENT and PLAN   Mesenteric artery stenosis: I discussed  with the patient and his daughter that I am unsure whether or not his mesenteric stenosis is contributing to his current symptomatology.  I would expect him to have postprandial abdominal pain rather than postprandial abdominal rumbling and diarrhea.  He has had 40 pounds weight loss but that did not necessarily have a fear of food but rather eats bland food.  He has undergone an exhaustive GI workup and nothing has significantly improved his quality of life.  I think that with his ultrasound findings he needs to have a more diagnostic test to determine whether or not he is truly having mesenteric ischemic symptoms.  Because of his renal disease he is not a candidate for CT scan and so I think that angiography is next which can be done with less contrast.  I discussed abdominal aortography and likely selective imaging of his mesenteric vessels.  If I find that his superior mesenteric artery has a critical stenosis that was stented at that time.  He understands that even with stenting it may not resolve his symptoms.  This will be scheduled for Tuesday, July 14.   Malvina Serene CLORE, MD, FACS [facility] [phone] Pager [phone]

## 2023-07-26 NOTE — H&P (View-Only) (Signed)
 [facility] [place]  Patient name: [name] MRN: [id number] DOB: 1940-08-18 Sex: male   REQUESTING PROVIDER:   Alan Coombs   REASON FOR CONSULT:    SMA stenosis  HISTORY OF PRESENT ILLNESS:    [name] is a 83 y.o. male, who is referred for mesenteric artery stenosis that was detected on ultrasound.  He was recently in the hospital for diarrhea which was ultimately treated for C. difficile.  Patient has a multiple year history of motility issues including diarrhea and constipation.  He states that approximately 15 minutes after he eats his stomach starts to ramble and he has to use the bathroom immediately.  He does not endorse any postprandial abdominal pain.  He has lost approximately 40 pounds in the past year  The patient has a history of coronary artery disease, status post CABG in 2001 and PCI in 2020.  He suffers from COPD secondary to former tobacco abuse.  He is a diabetic.SABRA  He has undergone right nephrectomy and has chronic renal insufficiency.  His most recent creatinine was 2.5.  He has a history of biliary stricture status post ERCP and stent placement, all of which have been removed.  He has had gallstone pancreatitis in the past.  He is status post cholecystectomy.  His last EGD and sigmoidoscopy was in 2020.    PAST MEDICAL HISTORY    Past Medical History:  Diagnosis Date   Allergy     Anxiety    B12 deficiency anemia    Blood transfusion without reported diagnosis    CAD (coronary artery disease)    Cancer (HCC)    bladder, Right renal cell also   Chronic kidney disease    Colon polyps    COPD (chronic obstructive pulmonary disease) (HCC)    Depression    Diabetes mellitus (HCC)    Dyspnea    Esophagus, Barrett's    GERD (gastroesophageal reflux disease)    Headache    History of bladder cancer    Bladder cancer 8 times   History of hiatal hernia    Hyperlipidemia     Hypertension    Localized osteoarthrosis, lower leg    Myocardial infarction (HCC) 2021   Pneumonia    Restless leg syndrome    Sleep apnea    does not wear cpap   Stenosis of esophagus      FAMILY HISTORY   Family History  Problem Relation Age of Onset   Melanoma Mother    Stroke Father    Hypertension Father    Coronary artery disease Other    Colon cancer Neg Hx    Esophageal cancer Neg Hx    Stomach cancer Neg Hx    Rectal cancer Neg Hx    Pancreatic cancer Neg Hx    Liver disease Neg Hx    Inflammatory bowel disease Neg Hx     SOCIAL HISTORY:   Social History   Socioeconomic History   Marital status: Widowed    Spouse name: Not on file   Number of children: Not on file   Years of education: Not on file   Highest education level: 12th grade  Occupational History   Not on file  Tobacco Use   Smoking status: Former    Current packs/day: 0.00    Average packs/day: 0.5 packs/day for 5.0 years (2.5 ttl pk-yrs)    Types: Cigarettes    Start date: 03/31/1971    Quit date: 03/30/1976  Years since quitting: 47.3   Smokeless tobacco: Never  Vaping Use   Vaping status: Never Used  Substance and Sexual Activity   Alcohol use: No    Alcohol/week: 0.0 standard drinks of alcohol   Drug use: No   Sexual activity: Yes  Other Topics Concern   Not on file  Social History Narrative   Not on file   Social Drivers of Health   Financial Resource Strain: Low Risk  (02/10/2023)   Overall Financial Resource Strain (CARDIA)    Difficulty of Paying Living Expenses: Not hard at all  Food Insecurity: No Food Insecurity (07/12/2023)   Hunger Vital Sign    Worried About Running Out of Food in the Last Year: Never true    Ran Out of Food in the Last Year: Never true  Transportation Needs: No Transportation Needs (07/12/2023)   PRAPARE - Administrator, Civil Service (Medical): No    Lack of Transportation (Non-Medical): No  Physical Activity: Unknown (02/10/2023)    Exercise Vital Sign    Days of Exercise per Week: 0 days    Minutes of Exercise per Session: Not on file  Recent Concern: Physical Activity - Inactive (02/10/2023)   Exercise Vital Sign    Days of Exercise per Week: 0 days    Minutes of Exercise per Session: 30 min  Stress: No Stress Concern Present (02/10/2023)   [employer] of Occupational Health - Occupational Stress Questionnaire    Feeling of Stress : Not at all  Social Connections: Moderately Integrated (07/08/2023)   Social Connection and Isolation Panel    Frequency of Communication with Friends and Family: More than three times a week    Frequency of Social Gatherings with Friends and Family: Twice a week    Attends Religious Services: More than 4 times per year    Active Member of [employer] or Organizations: Yes    Attends Banker Meetings: More than 4 times per year    Marital Status: Widowed  Intimate Partner Violence: Not At Risk (07/08/2023)   Humiliation, Afraid, Rape, and Kick questionnaire    Fear of Current or Ex-Partner: No    Emotionally Abused: No    Physically Abused: No    Sexually Abused: No    ALLERGIES:    No Known Allergies  CURRENT MEDICATIONS:    Current Outpatient Medications  Medication Sig Dispense Refill   acetaminophen  (TYLENOL ) 500 MG tablet Take 1,000 mg by mouth every 6 (six) hours as needed for moderate pain.     amLODipine  (NORVASC ) 5 MG tablet TAKE 1 TABLET BY MOUTH DAILY 100 tablet 2   aspirin  EC 81 MG tablet Take 81 mg by mouth every evening. Swallow whole.     cetirizine (ZYRTEC) 10 MG tablet Take 10 mg by mouth daily as needed for allergies.     clopidogrel  (PLAVIX ) 75 MG tablet TAKE 1 TABLET BY MOUTH ONCE  DAILY 100 tablet 2   Coenzyme Q10 200 MG capsule Take 200 mg by mouth daily.     cyanocobalamin  (VITAMIN B12) 1000 MCG/ML injection Inject 1 mL (1,000 mcg total) into the muscle every 30 (thirty) days. 6 mL 11   cyclobenzaprine  (FLEXERIL ) 5 MG tablet Take 1 tablet  (5 mg total) by mouth at bedtime as needed for muscle spasms. 30 tablet 0   ezetimibe  (ZETIA ) 10 MG tablet TAKE 1 TABLET BY MOUTH DAILY 100 tablet 1   Fe Bisgly-Vit C-Vit B12-FA (GENTLE IRON PO) Take 1 tablet by  mouth daily.     fluticasone  (FLONASE ) 50 MCG/ACT nasal spray Place 1 spray into both nostrils daily as needed for allergies.     Fluticasone -Umeclidin-Vilant (TRELEGY ELLIPTA ) 200-62.5-25 MCG/ACT AEPB Inhale 1 puff into the lungs daily. 60 each 11   isosorbide  mononitrate (IMDUR ) 60 MG 24 hr tablet TAKE 1 TABLET BY MOUTH DAILY 100 tablet 2   Menthol, Topical Analgesic, (BENGAY EX) Apply 1 application topically daily as needed (pain).     METAMUCIL FIBER PO Take 1 capsule by mouth in the morning.     Multiple Vitamin (MULTIVITAMIN) tablet Take 1 tablet by mouth daily.     Naphazoline-Pheniramine (OPCON-A) 0.027-0.315 % SOLN Place 1 drop into both eyes daily as needed (itching eyes).     nebivolol  (BYSTOLIC ) 10 MG tablet TAKE 1 TABLET BY MOUTH DAILY 90 tablet 2   NEEDLE, DISP, 25 G (B-D DISP NEEDLE 25GX1) 25G X 1 MISC Inject 1000 mcg into muscle once a month. 50 each 0   nitroGLYCERIN  (NITROSTAT ) 0.4 MG SL tablet PLACE 1 TABLET UNDER THE TONGUE EVERY 5 MINUTES AS NEEDED FOR CHEST PAIN 25 tablet 0   oxyCODONE -acetaminophen  (PERCOCET) 10-325 MG tablet Take 1 tablet by mouth 2 (two) times daily. 60 tablet 0   pantoprazole  (PROTONIX ) 40 MG tablet TAKE 1 TABLET BY MOUTH ONCE  DAILY 100 tablet 2   polyethylene glycol powder (GLYCOLAX/MIRALAX) 17 GM/SCOOP powder Take 1 Container by mouth daily. As needed     rOPINIRole  (REQUIP ) 2 MG tablet TAKE 1 TABLET BY MOUTH AT  BEDTIME 100 tablet 1   rosuvastatin  (CRESTOR ) 10 MG tablet TAKE 1 TABLET BY MOUTH DAILY 90 tablet 2   saccharomyces boulardii (FLORASTOR) 250 MG capsule Take 250 mg by mouth daily.     venlafaxine  (EFFEXOR ) 100 MG tablet Take 1 tablet (100 mg total) by mouth daily. 90 tablet 1   No current facility-administered medications for this  visit.    REVIEW OF SYSTEMS:   [X]  denotes positive finding, [ ]  denotes negative finding Cardiac  Comments:  Chest pain or chest pressure:    Shortness of breath upon exertion:    Short of breath when lying flat:    Irregular heart rhythm:        Vascular    Pain in calf, thigh, or hip brought on by ambulation:    Pain in feet at night that wakes you up from your sleep:     Blood clot in your veins:    Leg swelling:         Pulmonary    Oxygen at home:    Productive cough:     Wheezing:         Neurologic    Sudden weakness in arms or legs:     Sudden numbness in arms or legs:     Sudden onset of difficulty speaking or slurred speech:    Temporary loss of vision in one eye:     Problems with dizziness:         Gastrointestinal    Blood in stool:      Vomited blood:         Genitourinary    Burning when urinating:     Blood in urine:        Psychiatric    Major depression:         Hematologic    Bleeding problems:    Problems with blood clotting too easily:        Skin  Rashes or ulcers:        Constitutional    Fever or chills:     PHYSICAL EXAM:   Vitals:   07/26/23 1033  BP: 129/64  Pulse: 60  Temp: 97.9 F (36.6 C)  SpO2: 94%  Weight: 154 lb (69.9 kg)  Height: 5' 9 (1.753 m)    GENERAL: The patient is a well-nourished male, in no acute distress. The vital signs are documented above. CARDIAC: There is a regular rate and rhythm. PULMONARY: Nonlabored respirations ABDOMEN: Soft and non-tender   MUSCULOSKELETAL: There are no major deformities or cyanosis. NEUROLOGIC: No focal weakness or paresthesias are detected. SKIN: There are no ulcers or rashes noted. PSYCHIATRIC: The patient has a normal affect.  STUDIES:   I have reviewed the following ultrasound: Mesenteric:  70 to 99% stenosis in the superior mesenteric artery.  Note: This was a technically difficult exam.   ASSESSMENT and PLAN   Mesenteric artery stenosis: I discussed  with the patient and his daughter that I am unsure whether or not his mesenteric stenosis is contributing to his current symptomatology.  I would expect him to have postprandial abdominal pain rather than postprandial abdominal rumbling and diarrhea.  He has had 40 pounds weight loss but that did not necessarily have a fear of food but rather eats bland food.  He has undergone an exhaustive GI workup and nothing has significantly improved his quality of life.  I think that with his ultrasound findings he needs to have a more diagnostic test to determine whether or not he is truly having mesenteric ischemic symptoms.  Because of his renal disease he is not a candidate for CT scan and so I think that angiography is next which can be done with less contrast.  I discussed abdominal aortography and likely selective imaging of his mesenteric vessels.  If I find that his superior mesenteric artery has a critical stenosis that was stented at that time.  He understands that even with stenting it may not resolve his symptoms.  This will be scheduled for Tuesday, July 14.   Malvina Serene CLORE, MD, FACS [facility] [phone] Pager [phone]

## 2023-07-27 DIAGNOSIS — I700 Atherosclerosis of aorta: Secondary | ICD-10-CM | POA: Diagnosis not present

## 2023-07-27 DIAGNOSIS — Z7951 Long term (current) use of inhaled steroids: Secondary | ICD-10-CM | POA: Diagnosis not present

## 2023-07-27 DIAGNOSIS — Z7902 Long term (current) use of antithrombotics/antiplatelets: Secondary | ICD-10-CM | POA: Diagnosis not present

## 2023-07-27 DIAGNOSIS — N179 Acute kidney failure, unspecified: Secondary | ICD-10-CM | POA: Diagnosis not present

## 2023-07-27 DIAGNOSIS — Z9181 History of falling: Secondary | ICD-10-CM | POA: Diagnosis not present

## 2023-07-27 DIAGNOSIS — Z7982 Long term (current) use of aspirin: Secondary | ICD-10-CM | POA: Diagnosis not present

## 2023-07-27 DIAGNOSIS — M545 Low back pain, unspecified: Secondary | ICD-10-CM | POA: Diagnosis not present

## 2023-07-27 DIAGNOSIS — G8929 Other chronic pain: Secondary | ICD-10-CM | POA: Diagnosis not present

## 2023-07-27 DIAGNOSIS — E039 Hypothyroidism, unspecified: Secondary | ICD-10-CM | POA: Diagnosis not present

## 2023-07-27 DIAGNOSIS — I129 Hypertensive chronic kidney disease with stage 1 through stage 4 chronic kidney disease, or unspecified chronic kidney disease: Secondary | ICD-10-CM | POA: Diagnosis not present

## 2023-07-27 DIAGNOSIS — J440 Chronic obstructive pulmonary disease with acute lower respiratory infection: Secondary | ICD-10-CM | POA: Diagnosis not present

## 2023-07-27 DIAGNOSIS — Z85828 Personal history of other malignant neoplasm of skin: Secondary | ICD-10-CM | POA: Diagnosis not present

## 2023-07-27 DIAGNOSIS — N184 Chronic kidney disease, stage 4 (severe): Secondary | ICD-10-CM | POA: Diagnosis not present

## 2023-07-27 DIAGNOSIS — N209 Urinary calculus, unspecified: Secondary | ICD-10-CM | POA: Diagnosis not present

## 2023-07-27 DIAGNOSIS — I2510 Atherosclerotic heart disease of native coronary artery without angina pectoris: Secondary | ICD-10-CM | POA: Diagnosis not present

## 2023-07-27 DIAGNOSIS — K222 Esophageal obstruction: Secondary | ICD-10-CM | POA: Diagnosis not present

## 2023-07-27 DIAGNOSIS — E785 Hyperlipidemia, unspecified: Secondary | ICD-10-CM | POA: Diagnosis not present

## 2023-07-27 DIAGNOSIS — R1310 Dysphagia, unspecified: Secondary | ICD-10-CM | POA: Diagnosis not present

## 2023-07-29 ENCOUNTER — Telehealth: Payer: Self-pay

## 2023-07-29 ENCOUNTER — Ambulatory Visit: Payer: Self-pay | Admitting: Physician Assistant

## 2023-07-29 ENCOUNTER — Ambulatory Visit (HOSPITAL_COMMUNITY)
Admission: RE | Admit: 2023-07-29 | Discharge: 2023-07-29 | Disposition: A | Discharge status: Home / Self Care | Source: Ambulatory Visit | Attending: Physician Assistant | Admitting: Physician Assistant

## 2023-07-29 ENCOUNTER — Other Ambulatory Visit: Payer: Self-pay | Admitting: Physician Assistant

## 2023-07-29 DIAGNOSIS — K219 Gastro-esophageal reflux disease without esophagitis: Secondary | ICD-10-CM

## 2023-07-29 DIAGNOSIS — R1310 Dysphagia, unspecified: Secondary | ICD-10-CM | POA: Diagnosis not present

## 2023-07-29 DIAGNOSIS — R1319 Other dysphagia: Secondary | ICD-10-CM

## 2023-07-29 DIAGNOSIS — I25709 Atherosclerosis of coronary artery bypass graft(s), unspecified, with unspecified angina pectoris: Secondary | ICD-10-CM

## 2023-07-29 DIAGNOSIS — R0609 Other forms of dyspnea: Secondary | ICD-10-CM

## 2023-07-29 DIAGNOSIS — K6289 Other specified diseases of anus and rectum: Secondary | ICD-10-CM

## 2023-07-29 DIAGNOSIS — Z860101 Personal history of adenomatous and serrated colon polyps: Secondary | ICD-10-CM

## 2023-07-29 DIAGNOSIS — R1013 Epigastric pain: Secondary | ICD-10-CM

## 2023-07-29 DIAGNOSIS — K222 Esophageal obstruction: Secondary | ICD-10-CM

## 2023-07-29 NOTE — Telephone Encounter (Signed)
   Patient Name: [name]  DOB: 1941/01/03 MRN: [id number]  Primary Cardiologist: Debby Sor, MD  Patient is on Plavix  for history of CAD.  Patient has previously been approved by Dr. Sor to hold Plavix  for 5 days prior to procedure.  Patient may hold Plavix  for 5 days prior to EGD with dilation, patient should remain on aspirin  81 mg daily through out perioperative period.  Please resume Plavix  as soon as safe to do so from a bleeding standpoint.  I will route this recommendation to the requesting party via Epic fax function and remove from pre-op pool.  Please call with questions.  Carron Mcmurry D Ithzel Fedorchak, NP 07/29/2023, 2:09 PM

## 2023-07-29 NOTE — Telephone Encounter (Signed)
 [facility] Pre-operative Risk Assessment     Request for surgical clearance:     Endoscopy Procedure  What type of surgery is being performed?     EGD with dilation  When is this surgery scheduled?     TBD  What type of clearance is required ?   Pharmacy  Are there any medications that need to be held prior to surgery and how long? Plavix  5 days    Practice name and name of physician performing surgery?      [facility]  What is your office phone and fax number?      Phone- [phone]  Fax- [phone]  Anesthesia type (None, local, MAC, general) ?       MAC   Please route your response to Cristino Single, RN

## 2023-07-30 DIAGNOSIS — I700 Atherosclerosis of aorta: Secondary | ICD-10-CM | POA: Diagnosis not present

## 2023-07-30 DIAGNOSIS — G8929 Other chronic pain: Secondary | ICD-10-CM | POA: Diagnosis not present

## 2023-07-30 DIAGNOSIS — K222 Esophageal obstruction: Secondary | ICD-10-CM | POA: Diagnosis not present

## 2023-07-30 DIAGNOSIS — J440 Chronic obstructive pulmonary disease with acute lower respiratory infection: Secondary | ICD-10-CM | POA: Diagnosis not present

## 2023-07-30 DIAGNOSIS — E785 Hyperlipidemia, unspecified: Secondary | ICD-10-CM | POA: Diagnosis not present

## 2023-07-30 DIAGNOSIS — Z7951 Long term (current) use of inhaled steroids: Secondary | ICD-10-CM | POA: Diagnosis not present

## 2023-07-30 DIAGNOSIS — E039 Hypothyroidism, unspecified: Secondary | ICD-10-CM | POA: Diagnosis not present

## 2023-07-30 DIAGNOSIS — I2510 Atherosclerotic heart disease of native coronary artery without angina pectoris: Secondary | ICD-10-CM | POA: Diagnosis not present

## 2023-07-30 DIAGNOSIS — Z7982 Long term (current) use of aspirin: Secondary | ICD-10-CM | POA: Diagnosis not present

## 2023-07-30 DIAGNOSIS — Z7902 Long term (current) use of antithrombotics/antiplatelets: Secondary | ICD-10-CM | POA: Diagnosis not present

## 2023-07-30 DIAGNOSIS — N179 Acute kidney failure, unspecified: Secondary | ICD-10-CM | POA: Diagnosis not present

## 2023-07-30 DIAGNOSIS — R1310 Dysphagia, unspecified: Secondary | ICD-10-CM | POA: Diagnosis not present

## 2023-07-30 DIAGNOSIS — M545 Low back pain, unspecified: Secondary | ICD-10-CM | POA: Diagnosis not present

## 2023-07-30 DIAGNOSIS — Z85828 Personal history of other malignant neoplasm of skin: Secondary | ICD-10-CM | POA: Diagnosis not present

## 2023-07-30 DIAGNOSIS — N209 Urinary calculus, unspecified: Secondary | ICD-10-CM | POA: Diagnosis not present

## 2023-07-30 DIAGNOSIS — Z9181 History of falling: Secondary | ICD-10-CM | POA: Diagnosis not present

## 2023-07-30 DIAGNOSIS — I129 Hypertensive chronic kidney disease with stage 1 through stage 4 chronic kidney disease, or unspecified chronic kidney disease: Secondary | ICD-10-CM | POA: Diagnosis not present

## 2023-07-30 DIAGNOSIS — N184 Chronic kidney disease, stage 4 (severe): Secondary | ICD-10-CM | POA: Diagnosis not present

## 2023-08-02 ENCOUNTER — Telehealth: Payer: Self-pay | Admitting: Pulmonary Disease

## 2023-08-02 NOTE — Telephone Encounter (Unsigned)
 Copied from CRM [id number]. Topic: Clinical - Medication Refill >> Aug 02, 2023 12:59 PM Celestine F wrote: Medication: Fluticasone -Umeclidin-Vilant (TRELEGY ELLIPTA ) 200-62.5-25 MCG/ACT AEPB [[id number]]  Has the patient contacted their pharmacy? Yes; needs a new prescription. (Agent: If no, request that the patient contact the pharmacy for the refill. If patient does not wish to contact the pharmacy document the reason why and proceed with request.) (Agent: If yes, when and what did the pharmacy advise?)  This is the patient's preferred pharmacy:   [facility] - [place], [place] - [address] [place] [address] Phone: [phone] Fax: [phone]  Is this the correct pharmacy for this prescription? Yes If no, delete pharmacy and type the correct one.   Has the prescription been filled recently? Yes; 05/19/2023   Is the patient out of the medication? Yes  Has the patient been seen for an appointment in the last year OR does the patient have an upcoming appointment? Yes  Can we respond through MyChart? Yes  Agent: Please be advised that Rx refills may take up to 3 business days. We ask that you follow-up with your pharmacy.

## 2023-08-03 ENCOUNTER — Encounter (HOSPITAL_COMMUNITY)
Admission: RE | Disposition: A | Discharge status: Home / Self Care | Payer: Self-pay | Source: Home / Self Care | Attending: Surgery

## 2023-08-03 ENCOUNTER — Ambulatory Visit (HOSPITAL_COMMUNITY)
Admission: RE | Admit: 2023-08-03 | Discharge: 2023-08-03 | Disposition: A | Discharge status: Home / Self Care | Attending: Surgery | Admitting: Surgery

## 2023-08-03 ENCOUNTER — Other Ambulatory Visit: Payer: Self-pay

## 2023-08-03 DIAGNOSIS — Z905 Acquired absence of kidney: Secondary | ICD-10-CM | POA: Insufficient documentation

## 2023-08-03 DIAGNOSIS — E1122 Type 2 diabetes mellitus with diabetic chronic kidney disease: Secondary | ICD-10-CM | POA: Insufficient documentation

## 2023-08-03 DIAGNOSIS — Z87891 Personal history of nicotine dependence: Secondary | ICD-10-CM | POA: Insufficient documentation

## 2023-08-03 DIAGNOSIS — K55059 Acute (reversible) ischemia of intestine, part and extent unspecified: Secondary | ICD-10-CM

## 2023-08-03 DIAGNOSIS — I129 Hypertensive chronic kidney disease with stage 1 through stage 4 chronic kidney disease, or unspecified chronic kidney disease: Secondary | ICD-10-CM | POA: Diagnosis not present

## 2023-08-03 DIAGNOSIS — Z951 Presence of aortocoronary bypass graft: Secondary | ICD-10-CM | POA: Diagnosis not present

## 2023-08-03 DIAGNOSIS — Z9049 Acquired absence of other specified parts of digestive tract: Secondary | ICD-10-CM | POA: Insufficient documentation

## 2023-08-03 DIAGNOSIS — K551 Chronic vascular disorders of intestine: Secondary | ICD-10-CM | POA: Diagnosis not present

## 2023-08-03 DIAGNOSIS — N189 Chronic kidney disease, unspecified: Secondary | ICD-10-CM | POA: Insufficient documentation

## 2023-08-03 HISTORY — PX: ABDOMINAL AORTOGRAM: CATH118222

## 2023-08-03 LAB — POCT I-STAT, CHEM 8
BUN: 36 mg/dL — ABNORMAL HIGH (ref 8–23)
Calcium, Ion: 1.16 mmol/L (ref 1.15–1.40)
Chloride: 109 mmol/L (ref 98–111)
Creatinine, Ser: 2.6 mg/dL — ABNORMAL HIGH (ref 0.61–1.24)
Glucose, Bld: 106 mg/dL — ABNORMAL HIGH (ref 70–99)
HCT: 34 % — ABNORMAL LOW (ref 39.0–52.0)
Hemoglobin: 11.6 g/dL — ABNORMAL LOW (ref 13.0–17.0)
Potassium: 5.3 mmol/L — ABNORMAL HIGH (ref 3.5–5.1)
Sodium: 142 mmol/L (ref 135–145)
TCO2: 26 mmol/L (ref 22–32)

## 2023-08-03 SURGERY — ABDOMINAL AORTOGRAM
Anesthesia: LOCAL

## 2023-08-03 MED ORDER — SODIUM CHLORIDE 0.9 % IV SOLN: 250.0000 mL | INTRAVENOUS | Status: DC | PRN

## 2023-08-03 MED ORDER — HEPARIN (PORCINE) IN NACL 1000-0.9 UT/500ML-% IV SOLN
INTRAVENOUS | Status: DC | PRN
  Administered 2023-08-03 (×2): 500 mL

## 2023-08-03 MED ORDER — FENTANYL CITRATE (PF) 100 MCG/2ML IJ SOLN
INTRAMUSCULAR | Status: DC | PRN
  Administered 2023-08-03: 50 ug via INTRAVENOUS

## 2023-08-03 MED ORDER — ACETAMINOPHEN 325 MG PO TABS: 650.0000 mg | ORAL_TABLET | ORAL | Status: DC | PRN

## 2023-08-03 MED ORDER — MIDAZOLAM HCL 2 MG/2ML IJ SOLN
INTRAMUSCULAR | Status: DC | PRN
  Administered 2023-08-03: 1 mg via INTRAVENOUS

## 2023-08-03 MED ORDER — SODIUM CHLORIDE 0.9 % IV SOLN: INTRAVENOUS | Status: DC

## 2023-08-03 MED ORDER — TRELEGY ELLIPTA 200-62.5-25 MCG/ACT IN AEPB
1.0000 | INHALATION_SPRAY | Freq: Every day | RESPIRATORY_TRACT | 11 refills | Status: AC
Start: 2023-08-03 — End: ?

## 2023-08-03 MED ORDER — SODIUM CHLORIDE 0.9 % WEIGHT BASED INFUSION: 1.0000 mL/kg/h | INTRAVENOUS | Status: DC

## 2023-08-03 MED ORDER — IODIXANOL 320 MG/ML IV SOLN
INTRAVENOUS | Status: DC | PRN
  Administered 2023-08-03: 30 mL

## 2023-08-03 MED ORDER — LIDOCAINE HCL (PF) 1 % IJ SOLN
INTRAMUSCULAR | Status: AC
  Filled 2023-08-03: qty 30

## 2023-08-03 MED ORDER — SODIUM CHLORIDE 0.9% FLUSH: 3.0000 mL | Freq: Two times a day (BID) | INTRAVENOUS | Status: DC

## 2023-08-03 MED ORDER — FENTANYL CITRATE (PF) 100 MCG/2ML IJ SOLN
INTRAMUSCULAR | Status: AC
Start: 2023-08-03 — End: 2023-08-03
  Filled 2023-08-03: qty 2

## 2023-08-03 MED ORDER — HYDRALAZINE HCL 20 MG/ML IJ SOLN: 5.0000 mg | INTRAMUSCULAR | Status: DC | PRN

## 2023-08-03 MED ORDER — MIDAZOLAM HCL 2 MG/2ML IJ SOLN
INTRAMUSCULAR | Status: AC
  Filled 2023-08-03: qty 2

## 2023-08-03 MED ORDER — SODIUM CHLORIDE 0.9% FLUSH: 3.0000 mL | INTRAVENOUS | Status: DC | PRN

## 2023-08-03 MED ORDER — LABETALOL HCL 5 MG/ML IV SOLN: 10.0000 mg | INTRAVENOUS | Status: DC | PRN

## 2023-08-03 MED ORDER — LIDOCAINE HCL (PF) 1 % IJ SOLN
INTRAMUSCULAR | Status: DC | PRN
  Administered 2023-08-03: 15 mL via INTRADERMAL

## 2023-08-03 MED ORDER — ONDANSETRON HCL 4 MG/2ML IJ SOLN: 4.0000 mg | Freq: Four times a day (QID) | INTRAMUSCULAR | Status: DC | PRN

## 2023-08-03 SURGICAL SUPPLY — 11 items
CATH OMNI FLUSH 5F 65CM (CATHETERS) IMPLANT
CATH SOS OMNI O 5F 80CM (CATHETERS) IMPLANT
COVER DOME SNAP 22 D (MISCELLANEOUS) IMPLANT
DEVICE VASC CLSR CELT ART 5 (Vascular Products) IMPLANT
KIT ANGIASSIST CO2 SYSTEM (KITS) IMPLANT
KIT MICROPUNCTURE NIT STIFF (SHEATH) IMPLANT
SET ATX-X65L (MISCELLANEOUS) IMPLANT
SHEATH PINNACLE 5F 10CM (SHEATH) IMPLANT
SHEATH PROBE COVER 6X72 (BAG) IMPLANT
TRAY PV CATH (CUSTOM PROCEDURE TRAY) ×1 IMPLANT
WIRE BENTSON .035X145CM (WIRE) IMPLANT

## 2023-08-03 NOTE — Telephone Encounter (Signed)
 Tried calling the patient, no answer went straight to vm. Sending mychart msg. Nothing further needed.

## 2023-08-03 NOTE — Progress Notes (Signed)
 Up and walked and tolerated well; right groin stable, no bleeding or hematoma

## 2023-08-03 NOTE — Interval H&P Note (Signed)
 History and Physical Interval Note:  08/03/2023 8:48 AM  [name]  has presented today for surgery, with the diagnosis of mesteric stenosis.  The various methods of treatment have been discussed with the patient and family. After consideration of risks, benefits and other options for treatment, the patient has consented to  Procedure(s): Lower Extremity Angiography (N/A) UPPER EXTREMITY INTERVENTION (N/A) VISCERAL ARTERY INTERVENTION (N/A) as a surgical intervention.  The patient's history has been reviewed, patient examined, no change in status, stable for surgery.  I have reviewed the patient's chart and labs.  Questions were answered to the patient's satisfaction.     [name]

## 2023-08-03 NOTE — Op Note (Signed)
    Patient name: [name] MRN: [id number] DOB: Mar 04, 1940 Sex: male  08/03/2023 Pre-operative Diagnosis: Possible mesenteric stenosis Post-operative diagnosis:  Same Surgeon:  [name] Procedure Performed:  1.  Ultrasound-guided access, right femoral artery  2.  Abdominal aortogram with CO2 and contrast  3.  Selective injection of the superior mesenteric artery  4.  Conscious sedation, 26 minutes  5.  Closure device, Celt   Indications: This is a 83 year old gentleman with postprandial abdominal issues, significant weight loss, and an ultrasound that suggested mesenteric stenosis.  He comes in today for further evaluation.  Because of his kidney function he was not a candidate for CT angiogram  Procedure:  The patient was identified in the holding area and taken to room 8.  The patient was then placed supine on the table and prepped and draped in the usual sterile fashion.  A time out was called.  Conscious sedation was administered with the use of IV fentanyl  and Versed  under continuous physician and nurse monitoring.  Heart rate, blood pressure, and oxygen saturation were continuously monitored.  Total sedation time was 26 minutes.  Ultrasound was used to evaluate the right common femoral artery.  It was patent .  A digital ultrasound image was acquired.  A micropuncture needle was used to access the right common femoral artery under ultrasound guidance.  An 018 wire was advanced without resistance and a micropuncture sheath was placed.  The 018 wire was removed and a benson wire was placed.  The micropuncture sheath was exchanged for a 5 french sheath.  An omniflush catheter was advanced over the wire to the level of T12 and an abdominal aortogram was performed with CO2 in the AP and lateral projections Findings:   Aortogram: No significant stenosis noted within the celiac artery.  Approximately 30 to 40% ostial superior mesenteric artery stenosis.  The inferior mesenteric artery  is patent.  No renal artery stenosis was visualized.  The infrarenal abdominal aorta was widely patent.  Bilateral common external iliac arteries are widely patent.  Superior mesenteric artery: Approximate 30-40% ostial superior mesenteric artery stenosis.    Intervention: In order to get better images of the superior mesenteric artery was cannulated with a SOS catheter and selective images in the supra mesenteric artery were performed.  The groin was closed with a Celt  Impression:  #1  Minimal supra mesenteric artery stenosis.  The celiac and inferior mesenteric artery are widely patent.  His mesenteric vascular issues do not explain his symptoms  V. [name], M.D., [facility] [place] Office: [phone] Pager:  [phone]

## 2023-08-04 ENCOUNTER — Encounter (HOSPITAL_COMMUNITY): Payer: Self-pay | Admitting: Surgery

## 2023-08-04 DIAGNOSIS — I2510 Atherosclerotic heart disease of native coronary artery without angina pectoris: Secondary | ICD-10-CM | POA: Diagnosis not present

## 2023-08-04 DIAGNOSIS — N184 Chronic kidney disease, stage 4 (severe): Secondary | ICD-10-CM | POA: Diagnosis not present

## 2023-08-04 DIAGNOSIS — G8929 Other chronic pain: Secondary | ICD-10-CM | POA: Diagnosis not present

## 2023-08-04 DIAGNOSIS — N179 Acute kidney failure, unspecified: Secondary | ICD-10-CM | POA: Diagnosis not present

## 2023-08-04 DIAGNOSIS — Z7951 Long term (current) use of inhaled steroids: Secondary | ICD-10-CM | POA: Diagnosis not present

## 2023-08-04 DIAGNOSIS — Z85828 Personal history of other malignant neoplasm of skin: Secondary | ICD-10-CM | POA: Diagnosis not present

## 2023-08-04 DIAGNOSIS — Z9181 History of falling: Secondary | ICD-10-CM | POA: Diagnosis not present

## 2023-08-04 DIAGNOSIS — J440 Chronic obstructive pulmonary disease with acute lower respiratory infection: Secondary | ICD-10-CM | POA: Diagnosis not present

## 2023-08-04 DIAGNOSIS — N209 Urinary calculus, unspecified: Secondary | ICD-10-CM | POA: Diagnosis not present

## 2023-08-04 DIAGNOSIS — K222 Esophageal obstruction: Secondary | ICD-10-CM | POA: Diagnosis not present

## 2023-08-04 DIAGNOSIS — E039 Hypothyroidism, unspecified: Secondary | ICD-10-CM | POA: Diagnosis not present

## 2023-08-04 DIAGNOSIS — M545 Low back pain, unspecified: Secondary | ICD-10-CM | POA: Diagnosis not present

## 2023-08-04 DIAGNOSIS — I700 Atherosclerosis of aorta: Secondary | ICD-10-CM | POA: Diagnosis not present

## 2023-08-04 DIAGNOSIS — R1310 Dysphagia, unspecified: Secondary | ICD-10-CM | POA: Diagnosis not present

## 2023-08-04 DIAGNOSIS — I129 Hypertensive chronic kidney disease with stage 1 through stage 4 chronic kidney disease, or unspecified chronic kidney disease: Secondary | ICD-10-CM | POA: Diagnosis not present

## 2023-08-04 DIAGNOSIS — Z7902 Long term (current) use of antithrombotics/antiplatelets: Secondary | ICD-10-CM | POA: Diagnosis not present

## 2023-08-04 DIAGNOSIS — Z7982 Long term (current) use of aspirin: Secondary | ICD-10-CM | POA: Diagnosis not present

## 2023-08-04 DIAGNOSIS — E785 Hyperlipidemia, unspecified: Secondary | ICD-10-CM | POA: Diagnosis not present

## 2023-08-06 DIAGNOSIS — E785 Hyperlipidemia, unspecified: Secondary | ICD-10-CM | POA: Diagnosis not present

## 2023-08-06 DIAGNOSIS — Z7902 Long term (current) use of antithrombotics/antiplatelets: Secondary | ICD-10-CM | POA: Diagnosis not present

## 2023-08-06 DIAGNOSIS — I700 Atherosclerosis of aorta: Secondary | ICD-10-CM | POA: Diagnosis not present

## 2023-08-06 DIAGNOSIS — N209 Urinary calculus, unspecified: Secondary | ICD-10-CM | POA: Diagnosis not present

## 2023-08-06 DIAGNOSIS — M545 Low back pain, unspecified: Secondary | ICD-10-CM | POA: Diagnosis not present

## 2023-08-06 DIAGNOSIS — N179 Acute kidney failure, unspecified: Secondary | ICD-10-CM | POA: Diagnosis not present

## 2023-08-06 DIAGNOSIS — K222 Esophageal obstruction: Secondary | ICD-10-CM | POA: Diagnosis not present

## 2023-08-06 DIAGNOSIS — E039 Hypothyroidism, unspecified: Secondary | ICD-10-CM | POA: Diagnosis not present

## 2023-08-06 DIAGNOSIS — J440 Chronic obstructive pulmonary disease with acute lower respiratory infection: Secondary | ICD-10-CM | POA: Diagnosis not present

## 2023-08-06 DIAGNOSIS — Z85828 Personal history of other malignant neoplasm of skin: Secondary | ICD-10-CM | POA: Diagnosis not present

## 2023-08-06 DIAGNOSIS — R1310 Dysphagia, unspecified: Secondary | ICD-10-CM | POA: Diagnosis not present

## 2023-08-06 DIAGNOSIS — Z7951 Long term (current) use of inhaled steroids: Secondary | ICD-10-CM | POA: Diagnosis not present

## 2023-08-06 DIAGNOSIS — G8929 Other chronic pain: Secondary | ICD-10-CM | POA: Diagnosis not present

## 2023-08-06 DIAGNOSIS — I2510 Atherosclerotic heart disease of native coronary artery without angina pectoris: Secondary | ICD-10-CM | POA: Diagnosis not present

## 2023-08-06 DIAGNOSIS — Z9181 History of falling: Secondary | ICD-10-CM | POA: Diagnosis not present

## 2023-08-06 DIAGNOSIS — Z7982 Long term (current) use of aspirin: Secondary | ICD-10-CM | POA: Diagnosis not present

## 2023-08-06 DIAGNOSIS — N184 Chronic kidney disease, stage 4 (severe): Secondary | ICD-10-CM | POA: Diagnosis not present

## 2023-08-06 DIAGNOSIS — I129 Hypertensive chronic kidney disease with stage 1 through stage 4 chronic kidney disease, or unspecified chronic kidney disease: Secondary | ICD-10-CM | POA: Diagnosis not present

## 2023-08-11 ENCOUNTER — Ambulatory Visit: Admitting: Pulmonary Disease

## 2023-08-11 ENCOUNTER — Encounter: Payer: Self-pay | Admitting: Pulmonary Disease

## 2023-08-11 VITALS — BP 128/62 | HR 66 | Temp 97.5°F | Ht 69.0 in | Wt 155.0 lb

## 2023-08-11 DIAGNOSIS — E785 Hyperlipidemia, unspecified: Secondary | ICD-10-CM | POA: Diagnosis not present

## 2023-08-11 DIAGNOSIS — R1310 Dysphagia, unspecified: Secondary | ICD-10-CM | POA: Diagnosis not present

## 2023-08-11 DIAGNOSIS — M545 Low back pain, unspecified: Secondary | ICD-10-CM | POA: Diagnosis not present

## 2023-08-11 DIAGNOSIS — Z7951 Long term (current) use of inhaled steroids: Secondary | ICD-10-CM | POA: Diagnosis not present

## 2023-08-11 DIAGNOSIS — N209 Urinary calculus, unspecified: Secondary | ICD-10-CM | POA: Diagnosis not present

## 2023-08-11 DIAGNOSIS — R0609 Other forms of dyspnea: Secondary | ICD-10-CM | POA: Diagnosis not present

## 2023-08-11 DIAGNOSIS — J440 Chronic obstructive pulmonary disease with acute lower respiratory infection: Secondary | ICD-10-CM | POA: Diagnosis not present

## 2023-08-11 DIAGNOSIS — Z7902 Long term (current) use of antithrombotics/antiplatelets: Secondary | ICD-10-CM | POA: Diagnosis not present

## 2023-08-11 DIAGNOSIS — N179 Acute kidney failure, unspecified: Secondary | ICD-10-CM | POA: Diagnosis not present

## 2023-08-11 DIAGNOSIS — J4540 Moderate persistent asthma, uncomplicated: Secondary | ICD-10-CM | POA: Diagnosis not present

## 2023-08-11 DIAGNOSIS — G8929 Other chronic pain: Secondary | ICD-10-CM | POA: Diagnosis not present

## 2023-08-11 DIAGNOSIS — Z9181 History of falling: Secondary | ICD-10-CM | POA: Diagnosis not present

## 2023-08-11 DIAGNOSIS — E039 Hypothyroidism, unspecified: Secondary | ICD-10-CM | POA: Diagnosis not present

## 2023-08-11 DIAGNOSIS — Z85828 Personal history of other malignant neoplasm of skin: Secondary | ICD-10-CM | POA: Diagnosis not present

## 2023-08-11 DIAGNOSIS — I700 Atherosclerosis of aorta: Secondary | ICD-10-CM | POA: Diagnosis not present

## 2023-08-11 DIAGNOSIS — I129 Hypertensive chronic kidney disease with stage 1 through stage 4 chronic kidney disease, or unspecified chronic kidney disease: Secondary | ICD-10-CM | POA: Diagnosis not present

## 2023-08-11 DIAGNOSIS — I2510 Atherosclerotic heart disease of native coronary artery without angina pectoris: Secondary | ICD-10-CM | POA: Diagnosis not present

## 2023-08-11 DIAGNOSIS — Z7982 Long term (current) use of aspirin: Secondary | ICD-10-CM | POA: Diagnosis not present

## 2023-08-11 DIAGNOSIS — N184 Chronic kidney disease, stage 4 (severe): Secondary | ICD-10-CM | POA: Diagnosis not present

## 2023-08-11 DIAGNOSIS — K222 Esophageal obstruction: Secondary | ICD-10-CM | POA: Diagnosis not present

## 2023-08-11 LAB — PULMONARY FUNCTION TEST
DL/VA % pred: 133 %
DL/VA: 5.44 ml/min/mmHg/L
DLCO cor % pred: 91 %
DLCO cor: 15.43 ml/min/mmHg
DLCO unc % pred: 83 %
DLCO unc: 13.95 ml/min/mmHg
FEF 25-75 Post: 2.46 L/s
FEF 25-75 Pre: 1.93 L/s
FEF2575-%Change-Post: 27 %
FEF2575-%Pred-Post: 251 %
FEF2575-%Pred-Pre: 197 %
FEV1-%Change-Post: 5 %
FEV1-%Pred-Post: 119 %
FEV1-%Pred-Pre: 113 %
FEV1-Post: 1.84 L
FEV1-Pre: 1.74 L
FEV1FVC-%Change-Post: -1 %
FEV1FVC-%Pred-Pre: 120 %
FEV6-%Change-Post: 7 %
FEV6-%Pred-Post: 106 %
FEV6-%Pred-Pre: 98 %
FEV6-Post: 2.18 L
FEV6-Pre: 2.03 L
FEV6FVC-%Pred-Post: 111 %
FEV6FVC-%Pred-Pre: 111 %
FVC-%Change-Post: 7 %
FVC-%Pred-Post: 95 %
FVC-%Pred-Pre: 88 %
FVC-Post: 2.18 L
FVC-Pre: 2.03 L
Post FEV1/FVC ratio: 84 %
Post FEV6/FVC ratio: 100 %
Pre FEV1/FVC ratio: 86 %
Pre FEV6/FVC Ratio: 100 %
RV % pred: 125 %
RV: 2.6 L
TLC % pred: 92 %
TLC: 4.47 L

## 2023-08-11 MED ORDER — ALBUTEROL SULFATE HFA 108 (90 BASE) MCG/ACT IN AERS: 2.0000 | INHALATION_SPRAY | Freq: Four times a day (QID) | RESPIRATORY_TRACT | 6 refills | Status: DC | PRN

## 2023-08-11 NOTE — Progress Notes (Signed)
 @Patient  ID: [name], male    DOB: Apr 13, 1940, 83 y.o.   MRN: [id number]  Chief Complaint  Patient presents with   Medical Management of Chronic Issues    PFT f/u , trelegy working well    Referring provider: Annella Donnice SAUNDERS, MD  HPI:   83 y.o. man with CAD status post CABG 2002 as well as a history of heart failure with recovered EF who were seen for evaluation of dyspnea on exertion.  Multiple PCP notes reviewed, discharge summary of 06/2023 with diarrhea reviewed.  Doing well.  Shortness of breath much improved with Trelegy.  He can do more.  Less dyspneic.  Go further etc.  He is very pleased by this.  PFTs performed today, full interpretation below, these are large with normal signs of air trapping.  We described and discussed at length how bronchodilators help with the setting of air trapping as it relates to the shortness of breath.  HPI initial visit: Patient states has been short of breath his whole life.  Had breathing issues his whole life.  As a child in his adult.  I guess he just longer live with it.  Eventually he was diagnosed with significant CAD in 2002 and underwent multivessel CABG.  This did not improve his shortness of breath at all per his report.  He stated he could not keep up with his peers in cardiac rehab.  This is progressed over time.  He was seen  Questionaires / Pulmonary Flowsheets:   ACT:      No data to display          MMRC:     No data to display          Epworth:      No data to display          Tests:   FENO:  No results found for: NITRICOXIDE  PFT:    Latest Ref Rng & Units 08/11/2023    8:52 AM  PFT Results  FVC-Pre L 2.03  P  FVC-Predicted Pre % 88  P  FVC-Post L 2.18  P  FVC-Predicted Post % 95  P  Pre FEV1/FVC % % 86  P  Post FEV1/FCV % % 84  P  FEV1-Pre L 1.74  P  FEV1-Predicted Pre % 113  P  FEV1-Post L 1.84  P  DLCO uncorrected ml/min/mmHg 13.95  P  DLCO UNC% % 83  P  DLCO corrected  ml/min/mmHg 15.43  P  DLCO COR %Predicted % 91  P  DLVA Predicted % 133  P  TLC L 4.47  P  TLC % Predicted % 92  P  RV % Predicted % 125  P    P Preliminary result  Personally interpreted normal spirometry, no bronchodilator spots, lung volumes consistent with air trapping, DLCO within normal limits.  WALK:     11/02/2018   11:01 AM 09/07/2018   10:46 AM 07/27/2018    3:59 PM  SIX MIN WALK  Supplimental Oxygen during Test? (L/min) No No No  Tech Comments: average pace/min SOB//lmr Pt. did first lap slow pace and second lap fast pace. He did get sob on second lap. ER average to moderate pace/SOB//lmr    Imaging: Personally reviewed and as per EMR in discussion this note PERIPHERAL VASCULAR CATHETERIZATION Result Date: 08/03/2023 Images from the original result were not included. Patient name: [name] MRN: [id number] DOB: [birth year] Sex: male 08/03/2023 Pre-operative Diagnosis: Possible mesenteric stenosis Post-operative  diagnosis:  Same Surgeon:  [name] Procedure Performed:  1.  Ultrasound-guided access, right femoral artery  2.  Abdominal aortogram with CO2 and contrast  3.  Selective injection of the superior mesenteric artery  4.  Conscious sedation, 26 minutes  5.  Closure device, Celt Indications: This is a 83 year old gentleman with postprandial abdominal issues, significant weight loss, and an ultrasound that suggested mesenteric stenosis.  He comes in today for further evaluation.  Because of his kidney function he was not a candidate for CT angiogram Procedure:  The patient was identified in the holding area and taken to room 8.  The patient was then placed supine on the table and prepped and draped in the usual sterile fashion.  A time out was called.  Conscious sedation was administered with the use of IV fentanyl  and Versed  under continuous physician and nurse monitoring.  Heart rate, blood pressure, and oxygen saturation were continuously monitored.  Total sedation time  was 26 minutes.  Ultrasound was used to evaluate the right common femoral artery.  It was patent .  A digital ultrasound image was acquired.  A micropuncture needle was used to access the right common femoral artery under ultrasound guidance.  An 018 wire was advanced without resistance and a micropuncture sheath was placed.  The 018 wire was removed and a benson wire was placed.  The micropuncture sheath was exchanged for a 5 french sheath.  An omniflush catheter was advanced over the wire to the level of T12 and an abdominal aortogram was performed with CO2 in the AP and lateral projections Findings:  Aortogram: No significant stenosis noted within the celiac artery.  Approximately 30 to 40% ostial superior mesenteric artery stenosis.  The inferior mesenteric artery is patent.  No renal artery stenosis was visualized.  The infrarenal abdominal aorta was widely patent.  Bilateral common external iliac arteries are widely patent.  Superior mesenteric artery: Approximate 30-40% ostial superior mesenteric artery stenosis.  Intervention: In order to get better images of the superior mesenteric artery was cannulated with a SOS catheter and selective images in the supra mesenteric artery were performed.  The groin was closed with a Celt Impression:  #1  Minimal supra mesenteric artery stenosis.  The celiac and inferior mesenteric artery are widely patent.  His mesenteric vascular issues do not explain his symptoms V. [name], M.D., [facility] [place] Office: [phone] Pager:  [phone]   DG ESOPHAGUS W SINGLE CM (SOL OR THIN BA) Result Date: 07/29/2023 CLINICAL DATA:  83 year old male with history of prior esophageal strictures requiring multiple EGD with dilation in the past who now presents with trouble swallowing. Esophagram requested. EXAM: ESOPHAGUS/BARIUM SWALLOW/TABLET STUDY TECHNIQUE: Single contrast examination was performed using thin liquid barium. This exam was  performed by Kacie Matthews PA-C, and was supervised and interpreted by Ryan Salvage, MD. FLUOROSCOPY: Radiation Exposure Index (as provided by the fluoroscopic device): 27.3 mGy Kerma COMPARISON:  None Available. FINDINGS: Swallowing: Appears normal. No vestibular penetration or aspiration seen. Pharynx: Unremarkable. Esophagus: Smooth narrowing/stricturing the distal esophagus. Esophageal motility: Generally preserved primary peristaltic waves, rare tertiary contractions noted and also mild proximal escape. Hiatal Hernia: No significant hiatal hernia. Gastroesophageal reflux: None visualized. Ingested 13mm barium tablet: Paused briefly at the gastroesophageal junction before ultimately passing. Other: ACDF hardward in place. IMPRESSION: 1. 1. Smooth narrowing/stricturing of the distal esophagus. There is transient delay of the 13 mm barium tablet in this vicinity, but the tablet eventually passed. 2.  No overt dysmotility although there were some occasional tertiary esophageal contractions. Electronically Signed   By: Ryan Salvage M.D.   On: 07/29/2023 12:00   VAS US  MESENTERIC DUPLEX Result Date: 07/21/2023 ABDOMINAL VISCERAL Patient Name:  ANIK WESCH  Date of Exam:   07/21/2023 Medical Rec #: [id number]          Accession #:    [id number] Date of Birth: January 20, 1940          Patient Gender: M Patient Age:   55 years Exam Location:  [address] Procedure:      VAS US  MESENTERIC Referring Phys: [id number] AMANDA R COLLIER -------------------------------------------------------------------------------- Limitations: Air/bowel gas and breathing artifact. Performing Technologist: Devere Dark RVT  Examination Guidelines: A complete evaluation includes B-mode imaging, spectral Doppler, color Doppler, and power Doppler as needed of all accessible portions of each vessel. Bilateral testing is considered an integral part of a complete examination. Limited examinations for reoccurring indications may be  performed as noted.  Duplex Findings: +----------------------+--------+--------+------+--------+ Mesenteric            PSV cm/sEDV cm/sPlaqueComments +----------------------+--------+--------+------+--------+ Aorta at SMA             97                          +----------------------+--------+--------+------+--------+ Aorta Prox              100                          +----------------------+--------+--------+------+--------+ Aorta Distal            129                          +----------------------+--------+--------+------+--------+ Celiac Artery Origin    119                          +----------------------+--------+--------+------+--------+ Celiac Artery Proximal   88      21                  +----------------------+--------+--------+------+--------+ SMA Origin              199      33                  +----------------------+--------+--------+------+--------+ SMA Proximal            199      33                  +----------------------+--------+--------+------+--------+ SMA Mid                 318      40                  +----------------------+--------+--------+------+--------+ SMA Distal              132      15                  +----------------------+--------+--------+------+--------+ Splenic                 194      37                  +----------------------+--------+--------+------+--------+    Summary: Mesenteric: 70 to 99% stenosis in the superior mesenteric artery. Note: This was a technically difficult exam.  *  See table(s) above for measurements and observations.  Diagnosing physician: Gaile New MD  Electronically signed by Gaile New MD on 07/21/2023 at 1:41:33 PM.    Final     Lab Results: Personally reviewed CBC    Component Value Date/Time   WBC 6.3 07/20/2023 1007   RBC 3.69 (L) 07/20/2023 1007   HGB 11.6 (L) 08/03/2023 0830   HGB 13.7 12/09/2020 0853   HGB 13.0 04/25/2007 1127   HCT 34.0 (L) 08/03/2023 0830    HCT 40.5 12/09/2020 0853   HCT 37.0 (L) 04/25/2007 1127   PLT 242.0 07/20/2023 1007   PLT 161 12/09/2020 0853   MCV 83.8 07/20/2023 1007   MCV 87 12/09/2020 0853   MCV 89.0 04/25/2007 1127   MCH 28.2 07/08/2023 1019   MCHC 33.3 07/20/2023 1007   RDW 14.0 07/20/2023 1007   RDW 13.0 12/09/2020 0853   RDW 13.8 04/25/2007 1127   LYMPHSABS 1.5 07/20/2023 1007   LYMPHSABS 2.5 04/25/2007 1127   MONOABS 0.8 07/20/2023 1007   MONOABS 0.6 04/25/2007 1127   EOSABS 0.1 07/20/2023 1007   EOSABS 0.0 04/25/2007 1127   BASOSABS 0.0 07/20/2023 1007   BASOSABS 0.0 04/25/2007 1127    BMET    Component Value Date/Time   NA 142 08/03/2023 0830   NA 144 12/09/2020 0853   K 5.3 (H) 08/03/2023 0830   CL 109 08/03/2023 0830   CO2 28 07/20/2023 1007   GLUCOSE 106 (H) 08/03/2023 0830   BUN 36 (H) 08/03/2023 0830   BUN 21 12/09/2020 0853   CREATININE 2.60 (H) 08/03/2023 0830   CREATININE 3.35 (H) 07/05/2023 1232   CALCIUM  8.8 07/20/2023 1007   GFRNONAA 24 (L) 07/10/2023 0415   GFRAA 47 (L) 11/13/2019 1006    BNP    Component Value Date/Time   BNP 190.9 (H) 05/10/2019 1634   BNP 1,392.1 (H) 01/04/2019 1327    ProBNP    Component Value Date/Time   PROBNP 150.0 (H) 07/27/2018 1618    Specialty Problems       Pulmonary Problems   Allergic rhinitis   Qualifier: Diagnosis of  By: Trudy, LPN, Kendell HERO       DOE (dyspnea on exertion)   Onset late 1990's  - Spirometry 10/22/09   FEV1 2.96 (86%)  With  fev1/fvc ratio 0.85 no graphics avail  - 07/27/2018   Walked RA  2 laps @  approx 259ft each @ avg pace  stopped due to  End of study, sob but not out of breath with sats still 94% -  D dimer 09/27/18 = 4.01 >>> perfusion lung scan 07/28/2018 > no evidence of pe - venous dopplers 07/29/18  done due to pos d dimer > neg bilaterally  - 09/07/2018   Walked RA  2 laps @  approx 250 ft each @ slow then fast pace  stopped due to  End of study, no sob and sats 94% at end of study       Pleural  effusion on left   First noted 06/23/18 Abd ct f/u from pancreatitis -  Perfusion lung scan 07/28/18  neg for pe/ venous dopplers nl - L chest u/s  08/04/2018  No free effusion to tap - f/u cxr 11/02/2018 no change very small/ loculated L effsuion  - CT 01/04/2019  As dz //Pleura: Mild diffuse pleural thickening overlying the left Lung  - cxr 02/07/2019 residual scarring only, no further f/u needed        Upper airway  cough syndrome   Onset as far back as he can remember with sense of nasal congestion and pnds, better p sinus surgery 2006 [place]  - Allergy  profile  08/08/2018 >  Eos 0.3 /  IgE  258 RAST Pos dog > cat  - Sinus CT 09/14/2018 : Very mild mucosal thickening right maxillary sinus, anterior left ethmoid air cells and left frontal sinus. Status post partial ethmoidectomy and bilateral maxillary antrostomy.      Acute hypoxemic respiratory failure (HCC)   Acute respiratory failure (HCC)   Multifocal pneumonia    No Known Allergies  Immunization History  Administered Date(s) Administered   Fluad Quad(high Dose 65+) 11/02/2018, 11/09/2019, 10/16/2021   Influenza Split 10/01/2010, 10/16/2011   Influenza Whole 01/20/2004, 11/24/2006, 10/19/2008, 10/22/2009   Influenza, High Dose Seasonal PF 10/19/2013, 10/22/2014, 09/30/2015, 10/26/2016, 11/19/2017, 11/01/2020, 11/04/2022   Influenza,inj,Quad PF,6+ Mos 10/18/2012   Influenza,inj,quad, With Preservative 10/19/2016   Moderna Sars-Covid-2 Vaccination 03/12/2019, 04/03/2019, 12/29/2019, 10/22/2020   Pfizer Covid-19 Vaccine Bivalent Booster 43yrs & up 10/22/2020   Pfizer(Comirnaty)Fall Seasonal Vaccine 12 years and older 11/19/2021   Pneumococcal Conjugate-13 05/29/2013   Pneumococcal Polysaccharide-23 10/22/2009   Tdap 11/24/2016   Tetanus 05/29/2013    Past Medical History:  Diagnosis Date   Allergy     Anxiety    B12 deficiency anemia    Blood transfusion without reported diagnosis    CAD (coronary artery disease)     Cancer (HCC)    bladder, Right renal cell also   Chronic kidney disease    Colon polyps    COPD (chronic obstructive pulmonary disease) (HCC)    Depression    Diabetes mellitus (HCC)    Dyspnea    Esophagus, Barrett's    GERD (gastroesophageal reflux disease)    Headache    History of bladder cancer    Bladder cancer 8 times   History of hiatal hernia    Hyperlipidemia    Hypertension    Localized osteoarthrosis, lower leg    Myocardial infarction (HCC) 2021   Pneumonia    Restless leg syndrome    Sleep apnea    does not wear cpap   Stenosis of esophagus     Tobacco History: Social History   Tobacco Use  Smoking Status Former   Current packs/day: 0.00   Average packs/day: 0.5 packs/day for 5.0 years (2.5 ttl pk-yrs)   Types: Cigarettes   Start date: 03/31/1971   Quit date: 03/30/1976   Years since quitting: 47.3  Smokeless Tobacco Never   Counseling given: Not Answered   Continue to not smoke  Outpatient Encounter Medications as of 08/11/2023  Medication Sig   acetaminophen  (TYLENOL ) 500 MG tablet Take 1,000 mg by mouth every 6 (six) hours as needed for moderate pain.   albuterol  (VENTOLIN  HFA) 108 (90 Base) MCG/ACT inhaler Inhale 2 puffs into the lungs every 6 (six) hours as needed for wheezing or shortness of breath.   amLODipine  (NORVASC ) 5 MG tablet TAKE 1 TABLET BY MOUTH DAILY   aspirin  EC 81 MG tablet Take 81 mg by mouth every evening. Swallow whole.   clopidogrel  (PLAVIX ) 75 MG tablet TAKE 1 TABLET BY MOUTH ONCE  DAILY   Coenzyme Q10 (CO Q-10) 300 MG CAPS Take 300 mg by mouth daily.   cyanocobalamin  (VITAMIN B12) 1000 MCG/ML injection Inject 1 mL (1,000 mcg total) into the muscle every 30 (thirty) days.   cyclobenzaprine  (FLEXERIL ) 5 MG tablet Take 1 tablet (5 mg total) by mouth at bedtime as needed  for muscle spasms.   ezetimibe  (ZETIA ) 10 MG tablet TAKE 1 TABLET BY MOUTH DAILY   Fe Bisgly-Vit C-Vit B12-FA (GENTLE IRON PO) Take 1 tablet by mouth daily.    fluticasone  (FLONASE ) 50 MCG/ACT nasal spray Place 1 spray into both nostrils daily as needed for allergies.   Fluticasone -Umeclidin-Vilant (TRELEGY ELLIPTA ) 200-62.5-25 MCG/ACT AEPB Inhale 1 puff into the lungs daily.   isosorbide  mononitrate (IMDUR ) 60 MG 24 hr tablet TAKE 1 TABLET BY MOUTH DAILY   Menthol, Topical Analgesic, (BENGAY EX) Apply 1 application topically daily as needed (pain).   Multiple Vitamin (MULTIVITAMIN) tablet Take 1 tablet by mouth daily.   nebivolol  (BYSTOLIC ) 10 MG tablet TAKE 1 TABLET BY MOUTH DAILY   NEEDLE, DISP, 25 G (B-D DISP NEEDLE 25GX1) 25G X 1 MISC Inject 1000 mcg into muscle once a month.   nitroGLYCERIN  (NITROSTAT ) 0.4 MG SL tablet PLACE 1 TABLET UNDER THE TONGUE EVERY 5 MINUTES AS NEEDED FOR CHEST PAIN   oxyCODONE -acetaminophen  (PERCOCET) 10-325 MG tablet Take 1 tablet by mouth 2 (two) times daily. (Patient taking differently: Take 0.5 tablets by mouth 3 (three) times daily.)   pantoprazole  (PROTONIX ) 40 MG tablet TAKE 1 TABLET BY MOUTH ONCE  DAILY   rOPINIRole  (REQUIP ) 2 MG tablet TAKE 1 TABLET BY MOUTH AT  BEDTIME   rosuvastatin  (CRESTOR ) 10 MG tablet TAKE 1 TABLET BY MOUTH DAILY   saccharomyces boulardii (FLORASTOR) 250 MG capsule Take 250 mg by mouth daily.   venlafaxine  (EFFEXOR ) 100 MG tablet Take 1 tablet (100 mg total) by mouth daily.   venlafaxine  (EFFEXOR ) 75 MG tablet Take 150 mg by mouth daily. (Patient not taking: Reported on 08/11/2023)   No facility-administered encounter medications on file as of 08/11/2023.     Review of Systems  Review of Systems  N/a Physical Exam  BP 128/62   Pulse 66   Temp (!) 97.5 F (36.4 C) (Oral)   Ht 5' 9 (1.753 m)   Wt 155 lb (70.3 kg)   SpO2 95%   BMI 22.89 kg/m   Wt Readings from Last 5 Encounters:  08/11/23 155 lb (70.3 kg)  08/03/23 150 lb (68 kg)  07/26/23 154 lb (69.9 kg)  07/20/23 156 lb 14.4 oz (71.2 kg)  07/07/23 149 lb 14.6 oz (68 kg)    BMI Readings from Last 5 Encounters:   08/11/23 22.89 kg/m  08/03/23 22.81 kg/m  07/26/23 22.74 kg/m  07/20/23 23.17 kg/m  07/07/23 22.14 kg/m     Physical Exam General: Sitting in chair, no acute distress Eyes: EOMI, no icterus Neck: Supple, no JVP Pulmonary: Clear, normal work of breathing Cardiovascular: Warm, no edema noted MSK: No synovitis, no joint effusion Neuro: Normal gait, no weakness Psych: Normal mood, full affect   Assessment & Plan:   Dyspnea on exertion: Suspect multifactorial largely due to deconditioning given gradual decrease in activity with multiple medical complications and hospitalizations over the years.  As well as decline with age.  In the setting of baseline shortness of breath his whole life, no improvement after CABG in the early 2000's.  Likely element of cardiac dysfunction given his dilatation and mild MVR, suspect this causes shortness of breath with transient pulmonary venous hypertension with normal physiology of exercise.  His chest parenchyma is relatively clear on serial chest images with exception of chronic pleural scarring or thickening.  Quite possible there is restrictive physiology from this.  PFTs performed indicative of air trapping.  Asthma suspected given prolonged symptoms.  Asthma: With  lifelong or long term dyspnea on exertion.  PFTs with air trapping no fixed obstruction DLCO within normal limits all fits with asthma.  Trelegy has helped immensely with his shortness of breath.  Encouraged him to continue with this.  Albuterol  for rescue use prescribed today.   Return in about 6 months (around 02/11/2024) for f/u Dr. Annella.   Donnice JONELLE Annella, MD 08/11/2023

## 2023-08-11 NOTE — Patient Instructions (Signed)
 Full PFT performed today.

## 2023-08-11 NOTE — Progress Notes (Deleted)
 Full PFT performed today.

## 2023-08-11 NOTE — Progress Notes (Signed)
 Full PFT performed today.

## 2023-08-11 NOTE — Patient Instructions (Addendum)
 Nice to see you again  Glad the Trelegy has helped, we should continue this  I will send the albuterol  inhaler to have on hand, use 2 puffs every 4-6 hours as needed for increased shortness of breath or wheezing  Return to clinic in 6 months or sooner as needed with Dr. Annella

## 2023-08-12 ENCOUNTER — Other Ambulatory Visit: Payer: Self-pay | Admitting: Nurse Practitioner

## 2023-08-12 DIAGNOSIS — K229 Disease of esophagus, unspecified: Secondary | ICD-10-CM

## 2023-08-17 ENCOUNTER — Other Ambulatory Visit: Payer: Self-pay

## 2023-08-17 DIAGNOSIS — K229 Disease of esophagus, unspecified: Secondary | ICD-10-CM

## 2023-08-17 MED ORDER — PANTOPRAZOLE SODIUM 40 MG PO TBEC: 40.0000 mg | DELAYED_RELEASE_TABLET | Freq: Every day | ORAL | 3 refills | Status: AC

## 2023-08-18 DIAGNOSIS — E1122 Type 2 diabetes mellitus with diabetic chronic kidney disease: Secondary | ICD-10-CM | POA: Diagnosis not present

## 2023-08-18 DIAGNOSIS — N184 Chronic kidney disease, stage 4 (severe): Secondary | ICD-10-CM | POA: Diagnosis not present

## 2023-08-18 DIAGNOSIS — E785 Hyperlipidemia, unspecified: Secondary | ICD-10-CM | POA: Diagnosis not present

## 2023-08-18 DIAGNOSIS — I129 Hypertensive chronic kidney disease with stage 1 through stage 4 chronic kidney disease, or unspecified chronic kidney disease: Secondary | ICD-10-CM | POA: Diagnosis not present

## 2023-08-18 DIAGNOSIS — N179 Acute kidney failure, unspecified: Secondary | ICD-10-CM | POA: Diagnosis not present

## 2023-08-18 DIAGNOSIS — I2510 Atherosclerotic heart disease of native coronary artery without angina pectoris: Secondary | ICD-10-CM | POA: Diagnosis not present

## 2023-08-18 DIAGNOSIS — C641 Malignant neoplasm of right kidney, except renal pelvis: Secondary | ICD-10-CM | POA: Diagnosis not present

## 2023-08-18 DIAGNOSIS — E039 Hypothyroidism, unspecified: Secondary | ICD-10-CM | POA: Diagnosis not present

## 2023-08-18 DIAGNOSIS — C679 Malignant neoplasm of bladder, unspecified: Secondary | ICD-10-CM | POA: Diagnosis not present

## 2023-08-18 LAB — COMPREHENSIVE METABOLIC PANEL WITH GFR
Albumin: 4.1 (ref 3.5–5.0)
Calcium: 8.8 (ref 8.7–10.7)
Globulin: 2.8
eGFR: 22

## 2023-08-18 LAB — CBC: RBC: 3.91 (ref 3.87–5.11)

## 2023-08-18 LAB — BASIC METABOLIC PANEL WITH GFR
BUN: 38 — AB (ref 4–21)
CO2: 26 — AB (ref 13–22)
Chloride: 106 (ref 99–108)
Creatinine: 2.8 — AB (ref 0.6–1.3)
Glucose: 103
Potassium: 6.1 meq/L — AB (ref 3.5–5.1)
Sodium: 143 (ref 137–147)

## 2023-08-18 LAB — CBC AND DIFFERENTIAL
HCT: 34 — AB (ref 41–53)
Hemoglobin: 10.9 — AB (ref 13.5–17.5)
Platelets: 156 K/uL (ref 150–400)
WBC: 7.7

## 2023-08-18 LAB — HEPATIC FUNCTION PANEL
ALT: 21 U/L (ref 10–40)
AST: 24 (ref 14–40)
Alkaline Phosphatase: 83 (ref 25–125)
Bilirubin, Total: 0.3

## 2023-08-18 LAB — IRON,TIBC AND FERRITIN PANEL
Iron: 91
TIBC: 239

## 2023-08-19 DIAGNOSIS — N184 Chronic kidney disease, stage 4 (severe): Secondary | ICD-10-CM | POA: Diagnosis not present

## 2023-08-19 DIAGNOSIS — E039 Hypothyroidism, unspecified: Secondary | ICD-10-CM | POA: Diagnosis not present

## 2023-08-19 DIAGNOSIS — Z7982 Long term (current) use of aspirin: Secondary | ICD-10-CM | POA: Diagnosis not present

## 2023-08-19 DIAGNOSIS — K222 Esophageal obstruction: Secondary | ICD-10-CM | POA: Diagnosis not present

## 2023-08-19 DIAGNOSIS — M545 Low back pain, unspecified: Secondary | ICD-10-CM | POA: Diagnosis not present

## 2023-08-19 DIAGNOSIS — G8929 Other chronic pain: Secondary | ICD-10-CM | POA: Diagnosis not present

## 2023-08-19 DIAGNOSIS — N179 Acute kidney failure, unspecified: Secondary | ICD-10-CM | POA: Diagnosis not present

## 2023-08-19 DIAGNOSIS — Z7902 Long term (current) use of antithrombotics/antiplatelets: Secondary | ICD-10-CM | POA: Diagnosis not present

## 2023-08-19 DIAGNOSIS — I129 Hypertensive chronic kidney disease with stage 1 through stage 4 chronic kidney disease, or unspecified chronic kidney disease: Secondary | ICD-10-CM | POA: Diagnosis not present

## 2023-08-19 DIAGNOSIS — Z9181 History of falling: Secondary | ICD-10-CM | POA: Diagnosis not present

## 2023-08-19 DIAGNOSIS — Z85828 Personal history of other malignant neoplasm of skin: Secondary | ICD-10-CM | POA: Diagnosis not present

## 2023-08-19 DIAGNOSIS — J440 Chronic obstructive pulmonary disease with acute lower respiratory infection: Secondary | ICD-10-CM | POA: Diagnosis not present

## 2023-08-19 DIAGNOSIS — R1310 Dysphagia, unspecified: Secondary | ICD-10-CM | POA: Diagnosis not present

## 2023-08-19 DIAGNOSIS — Z7951 Long term (current) use of inhaled steroids: Secondary | ICD-10-CM | POA: Diagnosis not present

## 2023-08-19 DIAGNOSIS — E785 Hyperlipidemia, unspecified: Secondary | ICD-10-CM | POA: Diagnosis not present

## 2023-08-19 DIAGNOSIS — I700 Atherosclerosis of aorta: Secondary | ICD-10-CM | POA: Diagnosis not present

## 2023-08-19 DIAGNOSIS — N209 Urinary calculus, unspecified: Secondary | ICD-10-CM | POA: Diagnosis not present

## 2023-08-19 DIAGNOSIS — I2510 Atherosclerotic heart disease of native coronary artery without angina pectoris: Secondary | ICD-10-CM | POA: Diagnosis not present

## 2023-08-20 LAB — LAB REPORT - SCANNED
Albumin, Urine POC: 329.8
Albumin/Creatinine Ratio, Urine, POC: 223
Creatinine, POC: 147.9 mg/dL
EGFR: 22

## 2023-08-21 ENCOUNTER — Other Ambulatory Visit: Payer: Self-pay | Admitting: Internal Medicine

## 2023-08-21 DIAGNOSIS — I10 Essential (primary) hypertension: Secondary | ICD-10-CM

## 2023-08-25 ENCOUNTER — Encounter: Payer: Self-pay | Admitting: Internal Medicine

## 2023-08-31 DIAGNOSIS — N184 Chronic kidney disease, stage 4 (severe): Secondary | ICD-10-CM | POA: Diagnosis not present

## 2023-09-07 ENCOUNTER — Emergency Department (HOSPITAL_BASED_OUTPATIENT_CLINIC_OR_DEPARTMENT_OTHER): Admitting: Radiology

## 2023-09-07 ENCOUNTER — Other Ambulatory Visit: Payer: Self-pay

## 2023-09-07 ENCOUNTER — Encounter (HOSPITAL_BASED_OUTPATIENT_CLINIC_OR_DEPARTMENT_OTHER): Payer: Self-pay

## 2023-09-07 ENCOUNTER — Inpatient Hospital Stay (HOSPITAL_BASED_OUTPATIENT_CLINIC_OR_DEPARTMENT_OTHER)
Admission: EM | Admit: 2023-09-07 | Discharge: 2023-09-14 | DRG: 871 | Disposition: A | Discharge status: Home / Self Care | Attending: Family Medicine | Admitting: Family Medicine

## 2023-09-07 ENCOUNTER — Emergency Department (HOSPITAL_BASED_OUTPATIENT_CLINIC_OR_DEPARTMENT_OTHER)

## 2023-09-07 DIAGNOSIS — G2581 Restless legs syndrome: Secondary | ICD-10-CM | POA: Diagnosis present

## 2023-09-07 DIAGNOSIS — I5023 Acute on chronic systolic (congestive) heart failure: Secondary | ICD-10-CM | POA: Diagnosis not present

## 2023-09-07 DIAGNOSIS — E785 Hyperlipidemia, unspecified: Secondary | ICD-10-CM | POA: Diagnosis not present

## 2023-09-07 DIAGNOSIS — D631 Anemia in chronic kidney disease: Secondary | ICD-10-CM | POA: Diagnosis not present

## 2023-09-07 DIAGNOSIS — R0602 Shortness of breath: Secondary | ICD-10-CM | POA: Diagnosis not present

## 2023-09-07 DIAGNOSIS — R0902 Hypoxemia: Secondary | ICD-10-CM | POA: Diagnosis not present

## 2023-09-07 DIAGNOSIS — Z7984 Long term (current) use of oral hypoglycemic drugs: Secondary | ICD-10-CM | POA: Diagnosis not present

## 2023-09-07 DIAGNOSIS — Z9889 Other specified postprocedural states: Secondary | ICD-10-CM

## 2023-09-07 DIAGNOSIS — K219 Gastro-esophageal reflux disease without esophagitis: Secondary | ICD-10-CM | POA: Diagnosis not present

## 2023-09-07 DIAGNOSIS — F32A Depression, unspecified: Secondary | ICD-10-CM | POA: Diagnosis present

## 2023-09-07 DIAGNOSIS — I4892 Unspecified atrial flutter: Secondary | ICD-10-CM | POA: Diagnosis present

## 2023-09-07 DIAGNOSIS — N184 Chronic kidney disease, stage 4 (severe): Secondary | ICD-10-CM | POA: Diagnosis not present

## 2023-09-07 DIAGNOSIS — I483 Typical atrial flutter: Secondary | ICD-10-CM

## 2023-09-07 DIAGNOSIS — I700 Atherosclerosis of aorta: Secondary | ICD-10-CM | POA: Diagnosis present

## 2023-09-07 DIAGNOSIS — I2489 Other forms of acute ischemic heart disease: Secondary | ICD-10-CM | POA: Diagnosis not present

## 2023-09-07 DIAGNOSIS — I5031 Acute diastolic (congestive) heart failure: Secondary | ICD-10-CM | POA: Diagnosis not present

## 2023-09-07 DIAGNOSIS — R7989 Other specified abnormal findings of blood chemistry: Secondary | ICD-10-CM | POA: Insufficient documentation

## 2023-09-07 DIAGNOSIS — E119 Type 2 diabetes mellitus without complications: Secondary | ICD-10-CM

## 2023-09-07 DIAGNOSIS — K222 Esophageal obstruction: Secondary | ICD-10-CM | POA: Diagnosis not present

## 2023-09-07 DIAGNOSIS — G4733 Obstructive sleep apnea (adult) (pediatric): Secondary | ICD-10-CM | POA: Diagnosis present

## 2023-09-07 DIAGNOSIS — R0609 Other forms of dyspnea: Secondary | ICD-10-CM

## 2023-09-07 DIAGNOSIS — Z823 Family history of stroke: Secondary | ICD-10-CM

## 2023-09-07 DIAGNOSIS — J440 Chronic obstructive pulmonary disease with acute lower respiratory infection: Secondary | ICD-10-CM | POA: Diagnosis present

## 2023-09-07 DIAGNOSIS — Z1152 Encounter for screening for COVID-19: Secondary | ICD-10-CM | POA: Diagnosis not present

## 2023-09-07 DIAGNOSIS — Z7901 Long term (current) use of anticoagulants: Secondary | ICD-10-CM | POA: Diagnosis not present

## 2023-09-07 DIAGNOSIS — I130 Hypertensive heart and chronic kidney disease with heart failure and stage 1 through stage 4 chronic kidney disease, or unspecified chronic kidney disease: Secondary | ICD-10-CM | POA: Diagnosis not present

## 2023-09-07 DIAGNOSIS — A419 Sepsis, unspecified organism: Principal | ICD-10-CM | POA: Diagnosis present

## 2023-09-07 DIAGNOSIS — I509 Heart failure, unspecified: Secondary | ICD-10-CM | POA: Diagnosis not present

## 2023-09-07 DIAGNOSIS — Z8551 Personal history of malignant neoplasm of bladder: Secondary | ICD-10-CM

## 2023-09-07 DIAGNOSIS — J189 Pneumonia, unspecified organism: Principal | ICD-10-CM | POA: Diagnosis present

## 2023-09-07 DIAGNOSIS — Z7902 Long term (current) use of antithrombotics/antiplatelets: Secondary | ICD-10-CM

## 2023-09-07 DIAGNOSIS — Z7982 Long term (current) use of aspirin: Secondary | ICD-10-CM

## 2023-09-07 DIAGNOSIS — Z8249 Family history of ischemic heart disease and other diseases of the circulatory system: Secondary | ICD-10-CM

## 2023-09-07 DIAGNOSIS — Z8659 Personal history of other mental and behavioral disorders: Secondary | ICD-10-CM | POA: Diagnosis not present

## 2023-09-07 DIAGNOSIS — Z8679 Personal history of other diseases of the circulatory system: Secondary | ICD-10-CM | POA: Diagnosis not present

## 2023-09-07 DIAGNOSIS — Z8719 Personal history of other diseases of the digestive system: Secondary | ICD-10-CM

## 2023-09-07 DIAGNOSIS — E1122 Type 2 diabetes mellitus with diabetic chronic kidney disease: Secondary | ICD-10-CM | POA: Diagnosis not present

## 2023-09-07 DIAGNOSIS — Z79899 Other long term (current) drug therapy: Secondary | ICD-10-CM

## 2023-09-07 DIAGNOSIS — G894 Chronic pain syndrome: Secondary | ICD-10-CM | POA: Diagnosis present

## 2023-09-07 DIAGNOSIS — I25810 Atherosclerosis of coronary artery bypass graft(s) without angina pectoris: Secondary | ICD-10-CM | POA: Diagnosis not present

## 2023-09-07 DIAGNOSIS — J9601 Acute respiratory failure with hypoxia: Secondary | ICD-10-CM | POA: Diagnosis present

## 2023-09-07 DIAGNOSIS — Z808 Family history of malignant neoplasm of other organs or systems: Secondary | ICD-10-CM

## 2023-09-07 DIAGNOSIS — Z905 Acquired absence of kidney: Secondary | ICD-10-CM | POA: Diagnosis not present

## 2023-09-07 DIAGNOSIS — I10 Essential (primary) hypertension: Secondary | ICD-10-CM | POA: Diagnosis present

## 2023-09-07 DIAGNOSIS — R590 Localized enlarged lymph nodes: Secondary | ICD-10-CM | POA: Diagnosis not present

## 2023-09-07 DIAGNOSIS — I5033 Acute on chronic diastolic (congestive) heart failure: Secondary | ICD-10-CM | POA: Diagnosis not present

## 2023-09-07 DIAGNOSIS — I252 Old myocardial infarction: Secondary | ICD-10-CM

## 2023-09-07 DIAGNOSIS — N179 Acute kidney failure, unspecified: Secondary | ICD-10-CM | POA: Diagnosis not present

## 2023-09-07 DIAGNOSIS — Z85528 Personal history of other malignant neoplasm of kidney: Secondary | ICD-10-CM

## 2023-09-07 DIAGNOSIS — J949 Pleural condition, unspecified: Secondary | ICD-10-CM | POA: Diagnosis not present

## 2023-09-07 DIAGNOSIS — E7849 Other hyperlipidemia: Secondary | ICD-10-CM | POA: Diagnosis not present

## 2023-09-07 DIAGNOSIS — Z87891 Personal history of nicotine dependence: Secondary | ICD-10-CM

## 2023-09-07 DIAGNOSIS — R918 Other nonspecific abnormal finding of lung field: Secondary | ICD-10-CM | POA: Diagnosis not present

## 2023-09-07 LAB — RESP PANEL BY RT-PCR (RSV, FLU A&B, COVID)  RVPGX2
Influenza A by PCR: NEGATIVE
Influenza B by PCR: NEGATIVE
Resp Syncytial Virus by PCR: NEGATIVE
SARS Coronavirus 2 by RT PCR: NEGATIVE

## 2023-09-07 LAB — CBC WITH DIFFERENTIAL/PLATELET
Abs Immature Granulocytes: 0.05 K/uL (ref 0.00–0.07)
Basophils Absolute: 0.1 K/uL (ref 0.0–0.1)
Basophils Relative: 0 %
Eosinophils Absolute: 0.2 K/uL (ref 0.0–0.5)
Eosinophils Relative: 2 %
HCT: 32.8 % — ABNORMAL LOW (ref 39.0–52.0)
Hemoglobin: 10.6 g/dL — ABNORMAL LOW (ref 13.0–17.0)
Immature Granulocytes: 0 %
Lymphocytes Relative: 13 %
Lymphs Abs: 1.9 K/uL (ref 0.7–4.0)
MCH: 28.7 pg (ref 26.0–34.0)
MCHC: 32.3 g/dL (ref 30.0–36.0)
MCV: 88.9 fL (ref 80.0–100.0)
Monocytes Absolute: 1.2 K/uL — ABNORMAL HIGH (ref 0.1–1.0)
Monocytes Relative: 8 %
Neutro Abs: 11.8 K/uL — ABNORMAL HIGH (ref 1.7–7.7)
Neutrophils Relative %: 77 %
Platelets: 203 K/uL (ref 150–400)
RBC: 3.69 MIL/uL — ABNORMAL LOW (ref 4.22–5.81)
RDW: 14.6 % (ref 11.5–15.5)
WBC: 15.2 K/uL — ABNORMAL HIGH (ref 4.0–10.5)
nRBC: 0 % (ref 0.0–0.2)

## 2023-09-07 LAB — COMPREHENSIVE METABOLIC PANEL WITH GFR
ALT: 18 U/L (ref 0–44)
AST: 37 U/L (ref 15–41)
Albumin: 4 g/dL (ref 3.5–5.0)
Alkaline Phosphatase: 89 U/L (ref 38–126)
Anion gap: 14 (ref 5–15)
BUN: 37 mg/dL — ABNORMAL HIGH (ref 8–23)
CO2: 24 mmol/L (ref 22–32)
Calcium: 8.6 mg/dL — ABNORMAL LOW (ref 8.9–10.3)
Chloride: 102 mmol/L (ref 98–111)
Creatinine, Ser: 2.65 mg/dL — ABNORMAL HIGH (ref 0.61–1.24)
GFR, Estimated: 23 mL/min — ABNORMAL LOW (ref >60)
Glucose, Bld: 126 mg/dL — ABNORMAL HIGH (ref 70–99)
Potassium: 4.4 mmol/L (ref 3.5–5.1)
Sodium: 140 mmol/L (ref 135–145)
Total Bilirubin: 0.6 mg/dL (ref 0.0–1.2)
Total Protein: 7.4 g/dL (ref 6.5–8.1)

## 2023-09-07 LAB — TROPONIN T, HIGH SENSITIVITY: Troponin T High Sensitivity: 47 ng/L — ABNORMAL HIGH (ref 0–19)

## 2023-09-07 LAB — PRO BRAIN NATRIURETIC PEPTIDE: Pro Brain Natriuretic Peptide: 13045 pg/mL — ABNORMAL HIGH (ref <300.0)

## 2023-09-07 LAB — LACTIC ACID, PLASMA: Lactic Acid, Venous: 1.4 mmol/L (ref 0.5–1.9)

## 2023-09-07 MED ORDER — LEVOFLOXACIN IN D5W 750 MG/150ML IV SOLN
750.0000 mg | Freq: Once | INTRAVENOUS | Status: AC
  Administered 2023-09-07: 750 mg via INTRAVENOUS
  Filled 2023-09-07: qty 150

## 2023-09-07 MED ORDER — LACTATED RINGERS IV BOLUS (SEPSIS): 1000.0000 mL | Freq: Once | INTRAVENOUS | Status: DC

## 2023-09-07 MED ORDER — LACTATED RINGERS IV SOLN: INTRAVENOUS | Status: DC

## 2023-09-07 MED ORDER — LACTATED RINGERS IV BOLUS (SEPSIS)
1000.0000 mL | Freq: Once | INTRAVENOUS | Status: AC
  Administered 2023-09-07: 1000 mL via INTRAVENOUS

## 2023-09-07 MED ORDER — LACTATED RINGERS IV BOLUS (SEPSIS): 250.0000 mL | Freq: Once | INTRAVENOUS | Status: DC

## 2023-09-07 MED ORDER — FUROSEMIDE 10 MG/ML IJ SOLN
40.0000 mg | Freq: Once | INTRAMUSCULAR | Status: AC
  Administered 2023-09-07: 40 mg via INTRAVENOUS
  Filled 2023-09-07: qty 4

## 2023-09-07 MED ORDER — ACETAMINOPHEN 500 MG PO TABS
1000.0000 mg | ORAL_TABLET | Freq: Once | ORAL | Status: AC
  Administered 2023-09-07: 1000 mg via ORAL
  Filled 2023-09-07: qty 2

## 2023-09-07 NOTE — Plan of Care (Signed)
 Plan of Care Note for accepted transfer  Patient: [name]    FMW:[id number]  DOA: 09/07/2023     Facility requesting transfer: [facility]  Requesting Provider: Jerrol Agent, MD  Reason for transfer: Sepsis, secondary to multifocal pneumonia. / ? Complicated parapneumonic effusion  Facility course:   32 M with hx CAD, hx CABG x 4, COPD on room air, depression, hypertension, hyperlipidemia, esophageal stenosis, renal cell carcinoma status post nephrectomy, CKD IV, biliary stent, former smoker, presented with acute SOB, AHRF 89% on RA requiring 2L. Found to be septic with source appearing multifocal pneumonia, likely superimposed pulmonary edema. Small loculated fluid in the L major fissure and moderate R pleural effusion. ? Complex parapneumonic effusion will need evaluation for thoracentesis / chest tube. Was initially treated with 1 L IVF and then got lasix  40 IV. Got Levofloxacin  for abx.    Plan of care: The patient is accepted for admission to Telemetry unit, at [facility].  Author: Dorn Dawson, MD  09/07/2023  Check [email or web address] for on-call coverage.  Nursing staff, Please call [facility] Admits & Consults System-Wide number on Amion as soon as patient's arrival, so appropriate admitting provider can evaluate the pt.

## 2023-09-07 NOTE — ED Notes (Signed)
 Pt states c/o increasing ShOB over the last couple of days. He states he always has some degree of ShOB, but this is worse than normal. Respirations labored. RR 23-26. SpO2 91-92% on RA. Unilateral rales noted on auscultation.

## 2023-09-07 NOTE — ED Provider Notes (Addendum)
 [place] EMERGENCY DEPARTMENT AT [facility] Provider Note   CSN: [id number] Arrival date & time: 09/07/23  2003     Patient presents with: Shortness of Breath   [name] is a 83 y.o. male.   Patient with worsening shortness of breath for the past 2 days.  Today had significant chills.  Did not note that he had a fever.  But his temp here was 103.  No cough.  Oxygen levels here on arrival were 89% on room air.  Patient does not have oxygen at home.  Past medical history is significant for bladder cancer stenosis of esophagus esophageal reflux disease hyperlipidemia coronary artery disease COPD myocardial infarction in 2020 diabetes chronic kidney disease.  Patient had coronary bypass graft x 4.  Biliary stent in 2020.  Dilatation in 2022 patient is a former smoker quit 1978       Prior to Admission medications   Medication Sig Start Date End Date Taking? Authorizing Provider  acetaminophen  (TYLENOL ) 500 MG tablet Take 1,000 mg by mouth every 6 (six) hours as needed for moderate pain.    [provider]  albuterol  (VENTOLIN  HFA) 108 (90 Base) MCG/ACT inhaler Inhale 2 puffs into the lungs every 6 (six) hours as needed for wheezing or shortness of breath. 08/11/23   Hunsucker, Donnice SAUNDERS, MD  amLODipine  (NORVASC ) 5 MG tablet TAKE 1 TABLET BY MOUTH DAILY 08/23/23   Theophilus Andrews, Tully GRADE, MD  aspirin  EC 81 MG tablet Take 81 mg by mouth every evening. Swallow whole.    [provider]  clopidogrel  (PLAVIX ) 75 MG tablet TAKE 1 TABLET BY MOUTH ONCE  DAILY 03/11/23   Burnard Debby LABOR, MD  Coenzyme Q10 (CO Q-10) 300 MG CAPS Take 300 mg by mouth daily.    [provider]  cyanocobalamin  (VITAMIN B12) 1000 MCG/ML injection Inject 1 mL (1,000 mcg total) into the muscle every 30 (thirty) days. 07/20/23   Theophilus Andrews, Tully GRADE, MD  cyclobenzaprine  (FLEXERIL ) 5 MG tablet Take 1 tablet (5 mg total) by mouth at bedtime as needed for muscle spasms. 07/20/23    Theophilus Andrews, Tully GRADE, MD  ezetimibe  (ZETIA ) 10 MG tablet TAKE 1 TABLET BY MOUTH DAILY 07/19/23   Theophilus Andrews, Tully GRADE, MD  Fe Bisgly-Vit C-Vit B12-FA (GENTLE IRON PO) Take 1 tablet by mouth daily.    [provider]  fluticasone  (FLONASE ) 50 MCG/ACT nasal spray Place 1 spray into both nostrils daily as needed for allergies.    [provider]  Fluticasone -Umeclidin-Vilant (TRELEGY ELLIPTA ) 200-62.5-25 MCG/ACT AEPB Inhale 1 puff into the lungs daily. 08/03/23   Hunsucker, Donnice SAUNDERS, MD  isosorbide  mononitrate (IMDUR ) 60 MG 24 hr tablet TAKE 1 TABLET BY MOUTH DAILY 03/11/23   Burnard Debby LABOR, MD  Menthol, Topical Analgesic, (BENGAY EX) Apply 1 application topically daily as needed (pain).    [provider]  Multiple Vitamin (MULTIVITAMIN) tablet Take 1 tablet by mouth daily.    [provider]  nebivolol  (BYSTOLIC ) 10 MG tablet TAKE 1 TABLET BY MOUTH DAILY 05/11/23   Burnard Debby LABOR, MD  NEEDLE, DISP, 25 G (B-D DISP NEEDLE 25GX1) 25G X 1 MISC Inject 1000 mcg into muscle once a month. 07/20/23   Theophilus Andrews, Tully GRADE, MD  nitroGLYCERIN  (NITROSTAT ) 0.4 MG SL tablet PLACE 1 TABLET UNDER THE TONGUE EVERY 5 MINUTES AS NEEDED FOR CHEST PAIN 07/20/23   Theophilus Andrews, Tully GRADE, MD  oxyCODONE -acetaminophen  (PERCOCET) 10-325 MG tablet Take 1 tablet by  mouth 2 (two) times daily. Patient taking differently: Take 0.5 tablets by mouth 3 (three) times daily. 05/12/23   Theophilus Andrews, Tully GRADE, MD  pantoprazole  (PROTONIX ) 40 MG tablet Take 1 tablet (40 mg total) by mouth daily. 08/17/23   Craig Palma R, PA-C  rOPINIRole  (REQUIP ) 2 MG tablet TAKE 1 TABLET BY MOUTH AT  BEDTIME 05/24/23   Theophilus Andrews, Tully GRADE, MD  rosuvastatin  (CRESTOR ) 10 MG tablet TAKE 1 TABLET BY MOUTH DAILY 05/11/23   Burnard Debby LABOR, MD  saccharomyces boulardii (FLORASTOR) 250 MG capsule Take 250 mg by mouth daily.    [provider]  venlafaxine  (EFFEXOR ) 100 MG tablet Take 1  tablet (100 mg total) by mouth daily. 07/20/23   Theophilus Andrews, Tully GRADE, MD  venlafaxine  (EFFEXOR ) 75 MG tablet Take 150 mg by mouth daily. Patient not taking: Reported on 08/11/2023    [provider]    Allergies: Patient has no known allergies.    Review of Systems  Constitutional:  Positive for chills and fever.  HENT:  Negative for ear pain and sore throat.   Eyes:  Negative for pain and visual disturbance.  Respiratory:  Positive for shortness of breath. Negative for cough.   Cardiovascular:  Negative for chest pain and palpitations.  Gastrointestinal:  Negative for abdominal pain and vomiting.  Genitourinary:  Negative for dysuria and hematuria.  Musculoskeletal:  Negative for arthralgias and back pain.  Skin:  Negative for color change and rash.  Neurological:  Negative for seizures and syncope.  All other systems reviewed and are negative.   Updated Vital Signs BP (!) 152/83 (BP Location: Left Arm)   Pulse 79   Temp (!) 103.2 F (39.6 C) (Oral)   Resp (!) 23   SpO2 93%   Physical Exam Vitals and nursing note reviewed.  Constitutional:      General: He is not in acute distress.    Appearance: Normal appearance. He is well-developed.  HENT:     Head: Normocephalic and atraumatic.     Mouth/Throat:     Mouth: Mucous membranes are moist.  Eyes:     Extraocular Movements: Extraocular movements intact.     Conjunctiva/sclera: Conjunctivae normal.     Pupils: Pupils are equal, round, and reactive to light.  Cardiovascular:     Rate and Rhythm: Normal rate and regular rhythm.     Heart sounds: No murmur heard. Pulmonary:     Effort: Pulmonary effort is normal. No respiratory distress.     Breath sounds: Rhonchi present. No wheezing or rales.  Abdominal:     Palpations: Abdomen is soft.     Tenderness: There is no abdominal tenderness.  Musculoskeletal:        General: No swelling.     Cervical back: Normal range of motion and neck supple. No rigidity.      Comments: Trace edema both lower extremities  Skin:    General: Skin is warm and dry.     Capillary Refill: Capillary refill takes less than 2 seconds.  Neurological:     General: No focal deficit present.     Mental Status: He is alert and oriented to person, place, and time.  Psychiatric:        Mood and Affect: Mood normal.     (all labs ordered are listed, but only abnormal results are displayed) Labs Reviewed  CBC WITH DIFFERENTIAL/PLATELET - Abnormal; Notable for the following components:      Result Value   WBC  15.2 (*)    RBC 3.69 (*)    Hemoglobin 10.6 (*)    HCT 32.8 (*)    Neutro Abs 11.8 (*)    Monocytes Absolute 1.2 (*)    All other components within normal limits  CULTURE, BLOOD (ROUTINE X 2)  CULTURE, BLOOD (ROUTINE X 2)  RESP PANEL BY RT-PCR (RSV, FLU A&B, COVID)  RVPGX2  COMPREHENSIVE METABOLIC PANEL WITH GFR  PRO BRAIN NATRIURETIC PEPTIDE  LACTIC ACID, PLASMA  LACTIC ACID, PLASMA    EKG: None  Radiology: [facility] Chest Port 1 View Result Date: 09/07/2023 CLINICAL DATA:  10026. Shortness of breath worsening over the past 2 days. EXAM: PORTABLE CHEST 1 VIEW COMPARISON:  Portable chest 07/07/2023. FINDINGS: 8:26 p.m. there is chronic pleural-parenchymal disease in the left lower lung field. Small pleural effusions are beginning to form. The heart is slightly enlarged with CABG changes again noted and increased central vascular prominence. There is mild generalized interstitial consolidation consistent with interstitial edema. Findings consistent with CHF or fluid overload. There is patchy increased opacity in the left lower lung field which could be atelectasis, pneumonia or alveolar edema. No other focal infiltrate is seen. The mediastinum is normally outlined. Aortic atherosclerosis. Multilevel lower cervical fusion plating is again shown. Degenerative change thoracic spine. IMPRESSION: 1. Findings consistent with mild CHF or fluid overload. 2. Patchy increased  opacity in the left lower lung field which could be atelectasis, pneumonia or alveolar edema. 3. Chronic pleural-parenchymal disease in the left lower lung field. 4. Aortic atherosclerosis. Electronically Signed   By: Francis Quam M.D.   On: 09/07/2023 20:38     Procedures   Medications Ordered in the ED  lactated ringers  infusion (has no administration in time range)  lactated ringers  bolus 1,000 mL (has no administration in time range)    And  lactated ringers  bolus 1,000 mL (has no administration in time range)    And  lactated ringers  bolus 250 mL (has no administration in time range)  levofloxacin  (LEVAQUIN ) IVPB 750 mg (has no administration in time range)  acetaminophen  (TYLENOL ) tablet 1,000 mg (1,000 mg Oral Given 09/07/23 2048)                                    Medical Decision Making Amount and/or Complexity of Data Reviewed Labs: ordered.  Risk OTC drugs. Prescription drug management. Decision regarding hospitalization.  CRITICAL CARE Performed by: Saafir Abdullah Total critical care time: 45 minutes Critical care time was exclusive of separately billable procedures and treating other patients. Critical care was necessary to treat or prevent imminent or life-threatening deterioration. Critical care was time spent personally by me on the following activities: development of treatment plan with patient and/or surrogate as well as nursing, discussions with consultants, evaluation of patient's response to treatment, examination of patient, obtaining history from patient or surrogate, ordering and performing treatments and interventions, ordering and review of laboratory studies, ordering and review of radiographic studies, pulse oximetry and re-evaluation of patient's condition.  Patient was significant fever here temp 103.2 heart rate is 79 though blood pressure 152/83 oxygen saturation 99% on room air.  Patient was started on 2 L of oxygen.  Patient's white blood count  came back at 15.2.  Blood cultures and lactic acid still pending.  However chest x-ray seems to imply some mild CHF but patchy increased opacity left lower lung field which could be atelectasis and pneumonia.  Most likely pneumonia based on the presentation with the white count and fever.  Will start on sepsis order set.  Patient will need admission for probable left lower lobe pneumonia and hypoxia.  Start antibiotics for pneumonia.  Patient's lactic acid is very normal at 1.4.  So is less than 2.  Based on this we will stop the fluid boluses.  Do not want a put him in volume overload.  Complete metabolic panel is pending.  He is known to have chronic kidney disease.  I think this is just a developing pneumonia with hypoxia.  Complete metabolic panel sodium 140 potassium 4.4 chloride 102 glucose 126 creatinine 2.65 for GFR of 23 LFTs are normal.  BNP is markedly elevated at 13,000.  Another good reason to stop the fluids.  Will stop the maintenance fluids as well.  Could be a component of CHF as well.   Final diagnoses:  Community acquired pneumonia of left lower lobe of lung  Sepsis, due to unspecified organism, unspecified whether acute organ dysfunction present [facility])  Hypoxia    ED Discharge Orders     None          Geraldene Hamilton, MD 09/07/23 2055    Geraldene Hamilton, MD 09/07/23 2121    Geraldene Hamilton, MD 09/07/23 2134

## 2023-09-07 NOTE — Sepsis Progress Note (Signed)
 Elink monitoring for the code sepsis protocol.

## 2023-09-07 NOTE — ED Triage Notes (Addendum)
 Patient reports shortness of breath and has been taking nitroglycerin  today and gets some releif. He denies chest pain but does have a cardiac history. He states he's had it intermittently for a couple days but it was worse yesterday and today. He is able to speak full sentences in triage and has a cough. States that he also has been producing more mucous.   Patient's family member mentions he was hospitalized last month for sepsis, and that he has issues with his potassium as well from having his kidney removed.

## 2023-09-08 ENCOUNTER — Encounter: Admitting: Gastroenterology

## 2023-09-08 ENCOUNTER — Inpatient Hospital Stay (HOSPITAL_COMMUNITY)

## 2023-09-08 ENCOUNTER — Telehealth: Payer: Self-pay | Admitting: Gastroenterology

## 2023-09-08 ENCOUNTER — Encounter (HOSPITAL_COMMUNITY): Payer: Self-pay | Admitting: Internal Medicine

## 2023-09-08 DIAGNOSIS — N184 Chronic kidney disease, stage 4 (severe): Secondary | ICD-10-CM | POA: Diagnosis not present

## 2023-09-08 DIAGNOSIS — J189 Pneumonia, unspecified organism: Secondary | ICD-10-CM | POA: Diagnosis present

## 2023-09-08 DIAGNOSIS — I25810 Atherosclerosis of coronary artery bypass graft(s) without angina pectoris: Secondary | ICD-10-CM | POA: Diagnosis present

## 2023-09-08 DIAGNOSIS — R0902 Hypoxemia: Secondary | ICD-10-CM | POA: Diagnosis not present

## 2023-09-08 DIAGNOSIS — G894 Chronic pain syndrome: Secondary | ICD-10-CM | POA: Insufficient documentation

## 2023-09-08 DIAGNOSIS — E7849 Other hyperlipidemia: Secondary | ICD-10-CM

## 2023-09-08 DIAGNOSIS — I5021 Acute systolic (congestive) heart failure: Secondary | ICD-10-CM | POA: Diagnosis not present

## 2023-09-08 DIAGNOSIS — I509 Heart failure, unspecified: Secondary | ICD-10-CM

## 2023-09-08 DIAGNOSIS — I483 Typical atrial flutter: Secondary | ICD-10-CM | POA: Diagnosis not present

## 2023-09-08 DIAGNOSIS — J811 Chronic pulmonary edema: Secondary | ICD-10-CM | POA: Diagnosis not present

## 2023-09-08 DIAGNOSIS — E785 Hyperlipidemia, unspecified: Secondary | ICD-10-CM | POA: Diagnosis present

## 2023-09-08 DIAGNOSIS — I4892 Unspecified atrial flutter: Secondary | ICD-10-CM | POA: Diagnosis not present

## 2023-09-08 DIAGNOSIS — R269 Unspecified abnormalities of gait and mobility: Secondary | ICD-10-CM | POA: Diagnosis not present

## 2023-09-08 DIAGNOSIS — R0602 Shortness of breath: Secondary | ICD-10-CM | POA: Diagnosis not present

## 2023-09-08 DIAGNOSIS — R7989 Other specified abnormal findings of blood chemistry: Secondary | ICD-10-CM | POA: Insufficient documentation

## 2023-09-08 DIAGNOSIS — Z905 Acquired absence of kidney: Secondary | ICD-10-CM | POA: Diagnosis not present

## 2023-09-08 DIAGNOSIS — K219 Gastro-esophageal reflux disease without esophagitis: Secondary | ICD-10-CM

## 2023-09-08 DIAGNOSIS — I700 Atherosclerosis of aorta: Secondary | ICD-10-CM | POA: Diagnosis not present

## 2023-09-08 DIAGNOSIS — G2581 Restless legs syndrome: Secondary | ICD-10-CM | POA: Diagnosis not present

## 2023-09-08 DIAGNOSIS — Z981 Arthrodesis status: Secondary | ICD-10-CM | POA: Diagnosis not present

## 2023-09-08 DIAGNOSIS — I130 Hypertensive heart and chronic kidney disease with heart failure and stage 1 through stage 4 chronic kidney disease, or unspecified chronic kidney disease: Secondary | ICD-10-CM | POA: Diagnosis not present

## 2023-09-08 DIAGNOSIS — D631 Anemia in chronic kidney disease: Secondary | ICD-10-CM | POA: Diagnosis not present

## 2023-09-08 DIAGNOSIS — K222 Esophageal obstruction: Secondary | ICD-10-CM | POA: Diagnosis not present

## 2023-09-08 DIAGNOSIS — A419 Sepsis, unspecified organism: Secondary | ICD-10-CM

## 2023-09-08 DIAGNOSIS — I10 Essential (primary) hypertension: Secondary | ICD-10-CM

## 2023-09-08 DIAGNOSIS — Z8679 Personal history of other diseases of the circulatory system: Secondary | ICD-10-CM

## 2023-09-08 DIAGNOSIS — Z1152 Encounter for screening for COVID-19: Secondary | ICD-10-CM | POA: Diagnosis not present

## 2023-09-08 DIAGNOSIS — I5023 Acute on chronic systolic (congestive) heart failure: Secondary | ICD-10-CM | POA: Diagnosis not present

## 2023-09-08 DIAGNOSIS — Z7984 Long term (current) use of oral hypoglycemic drugs: Secondary | ICD-10-CM | POA: Diagnosis not present

## 2023-09-08 DIAGNOSIS — R0989 Other specified symptoms and signs involving the circulatory and respiratory systems: Secondary | ICD-10-CM | POA: Diagnosis not present

## 2023-09-08 DIAGNOSIS — I5031 Acute diastolic (congestive) heart failure: Secondary | ICD-10-CM | POA: Diagnosis not present

## 2023-09-08 DIAGNOSIS — F32A Depression, unspecified: Secondary | ICD-10-CM | POA: Diagnosis present

## 2023-09-08 DIAGNOSIS — Z7901 Long term (current) use of anticoagulants: Secondary | ICD-10-CM | POA: Diagnosis not present

## 2023-09-08 DIAGNOSIS — J440 Chronic obstructive pulmonary disease with acute lower respiratory infection: Secondary | ICD-10-CM | POA: Diagnosis not present

## 2023-09-08 DIAGNOSIS — E1122 Type 2 diabetes mellitus with diabetic chronic kidney disease: Secondary | ICD-10-CM | POA: Diagnosis not present

## 2023-09-08 DIAGNOSIS — N179 Acute kidney failure, unspecified: Secondary | ICD-10-CM | POA: Diagnosis not present

## 2023-09-08 DIAGNOSIS — J90 Pleural effusion, not elsewhere classified: Secondary | ICD-10-CM | POA: Diagnosis not present

## 2023-09-08 DIAGNOSIS — J810 Acute pulmonary edema: Secondary | ICD-10-CM | POA: Diagnosis not present

## 2023-09-08 DIAGNOSIS — Z8659 Personal history of other mental and behavioral disorders: Secondary | ICD-10-CM

## 2023-09-08 DIAGNOSIS — D649 Anemia, unspecified: Secondary | ICD-10-CM | POA: Diagnosis not present

## 2023-09-08 DIAGNOSIS — E8779 Other fluid overload: Secondary | ICD-10-CM | POA: Diagnosis not present

## 2023-09-08 DIAGNOSIS — I5033 Acute on chronic diastolic (congestive) heart failure: Secondary | ICD-10-CM | POA: Diagnosis not present

## 2023-09-08 DIAGNOSIS — E119 Type 2 diabetes mellitus without complications: Secondary | ICD-10-CM | POA: Diagnosis not present

## 2023-09-08 DIAGNOSIS — J9601 Acute respiratory failure with hypoxia: Secondary | ICD-10-CM | POA: Diagnosis not present

## 2023-09-08 DIAGNOSIS — R918 Other nonspecific abnormal finding of lung field: Secondary | ICD-10-CM | POA: Diagnosis not present

## 2023-09-08 DIAGNOSIS — I2489 Other forms of acute ischemic heart disease: Secondary | ICD-10-CM | POA: Diagnosis not present

## 2023-09-08 LAB — ECHOCARDIOGRAM COMPLETE
Area-P 1/2: 3.99 cm2
Calc EF: 51.3 %
Height: 69 in
S' Lateral: 3.65 cm
Single Plane A2C EF: 45.3 %
Single Plane A4C EF: 57.3 %
Weight: 2564.39 [oz_av]

## 2023-09-08 LAB — COMPREHENSIVE METABOLIC PANEL WITH GFR
ALT: 16 U/L (ref 0–44)
AST: 21 U/L (ref 15–41)
Albumin: 2.9 g/dL — ABNORMAL LOW (ref 3.5–5.0)
Alkaline Phosphatase: 68 U/L (ref 38–126)
Anion gap: 11 (ref 5–15)
BUN: 37 mg/dL — ABNORMAL HIGH (ref 8–23)
CO2: 26 mmol/L (ref 22–32)
Calcium: 8.3 mg/dL — ABNORMAL LOW (ref 8.9–10.3)
Chloride: 104 mmol/L (ref 98–111)
Creatinine, Ser: 2.65 mg/dL — ABNORMAL HIGH (ref 0.61–1.24)
GFR, Estimated: 23 mL/min — ABNORMAL LOW (ref >60)
Glucose, Bld: 109 mg/dL — ABNORMAL HIGH (ref 70–99)
Potassium: 3.7 mmol/L (ref 3.5–5.1)
Sodium: 141 mmol/L (ref 135–145)
Total Bilirubin: 1.2 mg/dL (ref 0.0–1.2)
Total Protein: 6.6 g/dL (ref 6.5–8.1)

## 2023-09-08 LAB — CBC
HCT: 31 % — ABNORMAL LOW (ref 39.0–52.0)
Hemoglobin: 10.1 g/dL — ABNORMAL LOW (ref 13.0–17.0)
MCH: 28.8 pg (ref 26.0–34.0)
MCHC: 32.6 g/dL (ref 30.0–36.0)
MCV: 88.3 fL (ref 80.0–100.0)
Platelets: 159 K/uL (ref 150–400)
RBC: 3.51 MIL/uL — ABNORMAL LOW (ref 4.22–5.81)
RDW: 14.6 % (ref 11.5–15.5)
WBC: 12 K/uL — ABNORMAL HIGH (ref 4.0–10.5)
nRBC: 0 % (ref 0.0–0.2)

## 2023-09-08 LAB — STREP PNEUMONIAE URINARY ANTIGEN: Strep Pneumo Urinary Antigen: NEGATIVE

## 2023-09-08 LAB — MRSA NEXT GEN BY PCR, NASAL: MRSA by PCR Next Gen: DETECTED — AB

## 2023-09-08 LAB — PROCALCITONIN: Procalcitonin: 0.36 ng/mL

## 2023-09-08 LAB — LACTIC ACID, PLASMA: Lactic Acid, Venous: 0.7 mmol/L (ref 0.5–1.9)

## 2023-09-08 MED ORDER — VENLAFAXINE HCL 50 MG PO TABS
100.0000 mg | ORAL_TABLET | Freq: Every day | ORAL | Status: DC
  Administered 2023-09-08 – 2023-09-14 (×7): 100 mg via ORAL
  Filled 2023-09-08 (×8): qty 2

## 2023-09-08 MED ORDER — SODIUM CHLORIDE 0.9 % IV SOLN
2.0000 g | INTRAVENOUS | Status: DC
  Administered 2023-09-08 – 2023-09-13 (×6): 2 g via INTRAVENOUS
  Filled 2023-09-08 (×6): qty 20

## 2023-09-08 MED ORDER — SODIUM CHLORIDE 0.9% FLUSH: 3.0000 mL | INTRAVENOUS | Status: DC | PRN

## 2023-09-08 MED ORDER — SODIUM CHLORIDE 0.9 % IV SOLN
250.0000 mL | INTRAVENOUS | Status: DC | PRN
Start: 2023-09-08 — End: 2023-09-09

## 2023-09-08 MED ORDER — OXYCODONE-ACETAMINOPHEN 5-325 MG PO TABS: 1.0000 | ORAL_TABLET | Freq: Two times a day (BID) | ORAL | Status: DC

## 2023-09-08 MED ORDER — DOCUSATE SODIUM 100 MG PO CAPS
100.0000 mg | ORAL_CAPSULE | Freq: Two times a day (BID) | ORAL | Status: DC
  Administered 2023-09-08 – 2023-09-10 (×4): 100 mg via ORAL
  Filled 2023-09-08 (×10): qty 1

## 2023-09-08 MED ORDER — EZETIMIBE 10 MG PO TABS
10.0000 mg | ORAL_TABLET | Freq: Every day | ORAL | Status: DC
Start: 2023-09-08 — End: 2023-09-14
  Administered 2023-09-08 – 2023-09-14 (×7): 10 mg via ORAL
  Filled 2023-09-08 (×7): qty 1

## 2023-09-08 MED ORDER — AMLODIPINE BESYLATE 5 MG PO TABS: 5.0000 mg | ORAL_TABLET | Freq: Every day | ORAL | Status: DC

## 2023-09-08 MED ORDER — BUDESON-GLYCOPYRROL-FORMOTEROL 160-9-4.8 MCG/ACT IN AERO
2.0000 | INHALATION_SPRAY | Freq: Two times a day (BID) | RESPIRATORY_TRACT | Status: DC
  Administered 2023-09-08 – 2023-09-14 (×12): 2 via RESPIRATORY_TRACT
  Filled 2023-09-08: qty 5.9

## 2023-09-08 MED ORDER — SODIUM CHLORIDE 0.9% FLUSH
3.0000 mL | Freq: Two times a day (BID) | INTRAVENOUS | Status: DC
  Administered 2023-09-08 – 2023-09-14 (×9): 3 mL via INTRAVENOUS

## 2023-09-08 MED ORDER — NEBIVOLOL HCL 10 MG PO TABS
10.0000 mg | ORAL_TABLET | Freq: Every day | ORAL | Status: DC
  Administered 2023-09-08 – 2023-09-12 (×5): 10 mg via ORAL
  Filled 2023-09-08 (×6): qty 1

## 2023-09-08 MED ORDER — ASPIRIN 81 MG PO TBEC
81.0000 mg | DELAYED_RELEASE_TABLET | Freq: Every evening | ORAL | Status: DC
  Administered 2023-09-08 – 2023-09-09 (×2): 81 mg via ORAL
  Filled 2023-09-08 (×2): qty 1

## 2023-09-08 MED ORDER — ROPINIROLE HCL 1 MG PO TABS
2.0000 mg | ORAL_TABLET | Freq: Every day | ORAL | Status: DC
  Administered 2023-09-08 – 2023-09-13 (×6): 2 mg via ORAL
  Filled 2023-09-08 (×7): qty 2

## 2023-09-08 MED ORDER — OXYCODONE-ACETAMINOPHEN 5-325 MG PO TABS
1.0000 | ORAL_TABLET | Freq: Two times a day (BID) | ORAL | Status: DC
  Administered 2023-09-08: 1 via ORAL
  Filled 2023-09-08: qty 1

## 2023-09-08 MED ORDER — SODIUM CHLORIDE 0.9% FLUSH
3.0000 mL | Freq: Two times a day (BID) | INTRAVENOUS | Status: DC
  Administered 2023-09-08 (×2): 3 mL via INTRAVENOUS

## 2023-09-08 MED ORDER — CLOPIDOGREL BISULFATE 75 MG PO TABS
75.0000 mg | ORAL_TABLET | Freq: Every day | ORAL | Status: DC
  Administered 2023-09-08 – 2023-09-11 (×4): 75 mg via ORAL
  Filled 2023-09-08 (×4): qty 1

## 2023-09-08 MED ORDER — NITROGLYCERIN 0.4 MG SL SUBL: 0.4000 mg | SUBLINGUAL_TABLET | SUBLINGUAL | Status: DC | PRN

## 2023-09-08 MED ORDER — ISOSORBIDE MONONITRATE ER 60 MG PO TB24
60.0000 mg | ORAL_TABLET | Freq: Every day | ORAL | Status: DC
  Administered 2023-09-08 – 2023-09-10 (×3): 60 mg via ORAL
  Filled 2023-09-08 (×3): qty 1

## 2023-09-08 MED ORDER — ONDANSETRON HCL 4 MG/2ML IJ SOLN: 4.0000 mg | Freq: Four times a day (QID) | INTRAMUSCULAR | Status: DC | PRN

## 2023-09-08 MED ORDER — SODIUM CHLORIDE 0.9 % IV SOLN
100.0000 mg | Freq: Two times a day (BID) | INTRAVENOUS | Status: DC
  Administered 2023-09-08 – 2023-09-10 (×5): 100 mg via INTRAVENOUS
  Filled 2023-09-08 (×6): qty 100

## 2023-09-08 MED ORDER — OXYCODONE-ACETAMINOPHEN 5-325 MG PO TABS
1.0000 | ORAL_TABLET | Freq: Three times a day (TID) | ORAL | Status: DC | PRN
  Administered 2023-09-08 – 2023-09-14 (×11): 1 via ORAL
  Filled 2023-09-08 (×12): qty 1

## 2023-09-08 MED ORDER — ACETAMINOPHEN 650 MG RE SUPP: 650.0000 mg | Freq: Four times a day (QID) | RECTAL | Status: DC | PRN

## 2023-09-08 MED ORDER — IPRATROPIUM-ALBUTEROL 0.5-2.5 (3) MG/3ML IN SOLN: 3.0000 mL | Freq: Four times a day (QID) | RESPIRATORY_TRACT | Status: DC | PRN

## 2023-09-08 MED ORDER — FUROSEMIDE 10 MG/ML IJ SOLN
40.0000 mg | Freq: Three times a day (TID) | INTRAMUSCULAR | Status: AC
  Administered 2023-09-08 (×2): 40 mg via INTRAVENOUS
  Filled 2023-09-08 (×2): qty 4

## 2023-09-08 MED ORDER — ACETAMINOPHEN 325 MG PO TABS: 650.0000 mg | ORAL_TABLET | Freq: Four times a day (QID) | ORAL | Status: DC | PRN

## 2023-09-08 MED ORDER — SODIUM CHLORIDE 0.9 % IV SOLN: 1.0000 g | INTRAVENOUS | Status: DC

## 2023-09-08 MED ORDER — OXYCODONE HCL 5 MG PO TABS
5.0000 mg | ORAL_TABLET | Freq: Three times a day (TID) | ORAL | Status: DC | PRN
  Administered 2023-09-08 – 2023-09-12 (×5): 5 mg via ORAL
  Filled 2023-09-08 (×8): qty 1

## 2023-09-08 MED ORDER — ROSUVASTATIN CALCIUM 5 MG PO TABS
10.0000 mg | ORAL_TABLET | Freq: Every day | ORAL | Status: DC
Start: 2023-09-08 — End: 2023-09-14
  Administered 2023-09-08 – 2023-09-14 (×7): 10 mg via ORAL
  Filled 2023-09-08 (×7): qty 2

## 2023-09-08 MED ORDER — PANTOPRAZOLE SODIUM 40 MG PO TBEC
40.0000 mg | DELAYED_RELEASE_TABLET | Freq: Every day | ORAL | Status: DC
Start: 2023-09-08 — End: 2023-09-14
  Administered 2023-09-08 – 2023-09-14 (×7): 40 mg via ORAL
  Filled 2023-09-08 (×7): qty 1

## 2023-09-08 MED ORDER — OXYCODONE-ACETAMINOPHEN 10-325 MG PO TABS: 0.5000 | ORAL_TABLET | Freq: Two times a day (BID) | ORAL | Status: DC | PRN

## 2023-09-08 MED ORDER — HEPARIN SODIUM (PORCINE) 5000 UNIT/ML IJ SOLN
5000.0000 [IU] | Freq: Three times a day (TID) | INTRAMUSCULAR | Status: DC
  Administered 2023-09-08 – 2023-09-11 (×10): 5000 [IU] via SUBCUTANEOUS
  Filled 2023-09-08 (×10): qty 1

## 2023-09-08 MED ORDER — OXYCODONE HCL 5 MG PO TABS: 5.0000 mg | ORAL_TABLET | Freq: Two times a day (BID) | ORAL | Status: DC

## 2023-09-08 MED ORDER — OXYCODONE-ACETAMINOPHEN 10-325 MG PO TABS: 0.5000 | ORAL_TABLET | Freq: Three times a day (TID) | ORAL | Status: DC

## 2023-09-08 MED ORDER — ONDANSETRON HCL 4 MG PO TABS: 4.0000 mg | ORAL_TABLET | Freq: Four times a day (QID) | ORAL | Status: DC | PRN

## 2023-09-08 NOTE — Progress Notes (Signed)
  Echocardiogram 2D Echocardiogram has been performed.  Devora Ellouise SAUNDERS 09/08/2023, 2:41 PM

## 2023-09-08 NOTE — Plan of Care (Signed)
  Problem: Education: Goal: Knowledge of General Education information will improve Description: Including pain rating scale, medication(s)/side effects and non-pharmacologic comfort measures 09/08/2023 0120 by Lennie Rodgers BIRCH, RN Outcome: Progressing 09/08/2023 0116 by Lennie Rodgers BIRCH, RN Outcome: Progressing   Problem: Health Behavior/Discharge Planning: Goal: Ability to manage health-related needs will improve 09/08/2023 0120 by Lennie Rodgers BIRCH, RN Outcome: Progressing 09/08/2023 0116 by Lennie Rodgers BIRCH, RN Outcome: Progressing   Problem: Clinical Measurements: Goal: Ability to maintain clinical measurements within normal limits will improve 09/08/2023 0120 by Lennie Rodgers BIRCH, RN Outcome: Progressing 09/08/2023 0116 by Lennie Rodgers BIRCH, RN Outcome: Progressing Goal: Will remain free from infection 09/08/2023 0120 by Lennie Rodgers BIRCH, RN Outcome: Progressing 09/08/2023 0116 by Lennie Rodgers BIRCH, RN Outcome: Progressing Goal: Diagnostic test results will improve 09/08/2023 0120 by Lennie Rodgers BIRCH, RN Outcome: Progressing 09/08/2023 0116 by Lennie Rodgers BIRCH, RN Outcome: Progressing Goal: Respiratory complications will improve 09/08/2023 0120 by Lennie Rodgers BIRCH, RN Outcome: Progressing 09/08/2023 0116 by Lennie Rodgers BIRCH, RN Outcome: Progressing Goal: Cardiovascular complication will be avoided 09/08/2023 0120 by Lennie Rodgers BIRCH, RN Outcome: Progressing 09/08/2023 0116 by Lennie Rodgers BIRCH, RN Outcome: Progressing   Problem: Activity: Goal: Risk for activity intolerance will decrease 09/08/2023 0120 by Lennie Rodgers BIRCH, RN Outcome: Progressing 09/08/2023 0116 by Lennie Rodgers BIRCH, RN Outcome: Progressing   Problem: Nutrition: Goal: Adequate nutrition will be maintained 09/08/2023 0120 by Lennie Rodgers BIRCH, RN Outcome: Progressing 09/08/2023 0116 by Lennie Rodgers BIRCH, RN Outcome: Progressing   Problem: Coping: Goal: Level of anxiety will decrease 09/08/2023 0120 by Lennie Rodgers BIRCH, RN Outcome: Progressing 09/08/2023 0116 by Lennie Rodgers BIRCH, RN Outcome: Progressing   Problem: Elimination: Goal: Will not experience complications related to bowel motility 09/08/2023 0120 by Lennie Rodgers BIRCH, RN Outcome: Progressing 09/08/2023 0116 by Lennie Rodgers BIRCH, RN Outcome: Progressing Goal: Will not experience complications related to urinary retention 09/08/2023 0120 by Lennie Rodgers BIRCH, RN Outcome: Progressing 09/08/2023 0116 by Lennie Rodgers BIRCH, RN Outcome: Progressing   Problem: Pain Managment: Goal: General experience of comfort will improve and/or be controlled 09/08/2023 0120 by Lennie Rodgers BIRCH, RN Outcome: Progressing 09/08/2023 0116 by Lennie Rodgers BIRCH, RN Outcome: Progressing   Problem: Safety: Goal: Ability to remain free from injury will improve 09/08/2023 0120 by Lennie Rodgers BIRCH, RN Outcome: Progressing 09/08/2023 0116 by Lennie Rodgers BIRCH, RN Outcome: Progressing   Problem: Skin Integrity: Goal: Risk for impaired skin integrity will decrease 09/08/2023 0120 by Lennie Rodgers BIRCH, RN Outcome: Progressing 09/08/2023 0116 by Lennie Rodgers BIRCH, RN Outcome: Progressing

## 2023-09-08 NOTE — Progress Notes (Signed)
 Mobility Specialist Progress Note;   09/08/23 0905  Mobility  Activity Dangled on edge of bed  Level of Assistance Contact guard assist, steadying assist  Assistive Device None  Range of Motion/Exercises Active Assistive;Right leg;Left leg  Activity Response Tolerated well  Mobility Referral Yes  Mobility visit 1 Mobility  Mobility Specialist Start Time (ACUTE ONLY) 0905  Mobility Specialist Stop Time (ACUTE ONLY) 0911  Mobility Specialist Time Calculation (min) (ACUTE ONLY) 6 min   Pt deferred transferring to chair or ambulating this session d/t fatigue. Agreeable to sit up on EoB for a bit and perform BLE exercises. Actively assisted pt in exercises and pt agreeable to sit up for a bit as well. VSS throughout. Pt left with all needs met, call bell in reach.   Lauraine Erm Mobility Specialist Please contact via SecureChat or [employer] [phone]

## 2023-09-08 NOTE — Plan of Care (Signed)

## 2023-09-08 NOTE — Progress Notes (Signed)
    Patient: [name] FMW:[id number] DOB: [birth year]      Brief hospital course: 83 y.o. M with CAD s/p CABG, COPD not on home O2, HTN, RCC s/p nephrectomy, CKD IV baseline 2.8 who presented with SOB, found to have CHF flare and pneumonia.    This is a no charge note, for further details, please see the H&P by my partner, Dr. Sundil from earlier today.   Principal Problem:   CHF exacerbation (HCC) Active Problems:   Multifocal pneumonia   Depression   Essential hypertension   Restless leg syndrome   GERD (gastroesophageal reflux disease)   Diabetes mellitus type 2, noninsulin dependent (HCC)   History of pancreatitis   History of biliary stent insertion   Sepsis due to pneumonia (HCC)   Hyperlipidemia   Elevated brain natriuretic peptide (BNP) level   CKD (chronic kidney disease), stage IV (HCC)   History of CAD (coronary artery disease)   History of anxiety   History of renal cell cancer s/p  nephrectomy   Chronic pain syndrome    Continue antibiotics Sputum not able to be collected Cr stable, continue IV Lasix  Strict I/Os Daily BMP Needs outpatient follow up of CT chest with Pulm       Physical Exam: BP (!) 144/73 (BP Location: Right Arm)   Pulse (!) 101   Temp 98.8 F (37.1 C) (Oral)   Resp (!) 21   Ht 5' 9 (1.753 m)   Wt 72.7 kg   SpO2 92%   BMI 23.67 kg/m   Patient seen and examined.     Family Communication: None present        Author: Lonni SHAUNNA Dalton, MD 09/08/2023 1:49 PM

## 2023-09-08 NOTE — H&P (Signed)
 History and Physical    [name] FMW:[id number] DOB: Feb 07, 1940 DOA: 09/07/2023  PCP: Theophilus Andrews, Tully GRADE, MD   Patient coming from: Home   Chief Complaint:  Chief Complaint  Patient presents with   Shortness of Breath   ED TRIAGE note:    Patient reports shortness of breath and has been taking nitroglycerin  today and gets some releif. He denies chest pain but does have a cardiac history. He states he's had it intermittently for a couple days but it was worse yesterday and today. He is able to speak full sentences in triage and has a cough. States that he also has been producing more mucous.    Patient's family member mentions he was hospitalized last month for sepsis, and that he has issues with his potassium as well from having his kidney removed.      HPI:  [name] is a 83 y.o. male hx CAD s/pCABG x 4, COPD on room air, depression, hypertension, hyperlipidemia, esophageal stenosis, renal cell carcinoma status post nephrectomy, CKD IV, biliary stent, former smoke, essential hypertension who has been presented to [facility] emergency department complaining of shortness of breath that has been progressively getting worse.  In the ED patient has been treated both for pneumonia and CHF. In the ED patient received 1 L of LR bolus after that received Lasix  40 mg and after that started on maintenance fluid LR in the setting of low blood pressure.  Received Levaquin .  Hospitalist consulted for management of pneumonia and CHF exacerbation.  Patient was transferred from [facility] transferred to [facility].  At presentation to ED patient O2 sat dropped to 89% currently maintaining 95-96 on 2 L oxygen also 5 febrile Tachypnea and borderline hypertensive. Lab work, troponin within normal range. CBC showing leukocytosis 15, stable H&H 10.6 and 32 normal platelet count. CMP unremarkable with creatinine 3.65 and GFR 30 2300 function at baseline. Elevated proBNP  13,000. Respiratory panel negative for COVID, RSV and flu. Blood cultures are in process.  Chest x-ray showed finding consistent of CHF and fluid overload.  Patchy airspace disease concern for left lower lobe pneumonia.  Chronic overall parenchymal disease of the left lower lobe.  Aortic atherosclerosis.  CT chest showed: 1. Findings suggestive of pulmonary edema with moderate right pleural effusion and loculated fluid in the left major fissure. There are also multifocal consolidative opacities in the bilateral lungs, right greater than left, which could be seen with superimposed infection in the appropriate clinical setting.  2.  Mediastinal lymphadenopathy, likely reactive.  Discussed CT scan findings with pulmonology Dr. Maree, after reviewing the imaging stating that patient does not need neither chest tube or thoracentesis as there is a very minimal fluid however he is concerned about the right sided lung lesion which could be pneumonia versus malignancy.  Recommended patient need repeat CT chest in 4 weeks for better clarity and on discharge please make sure he does follow-up with pulmonology to establish care/ discuss the CT chest image findings.  Pulmonary recommended treat with pneumonia IV antibiotics that should be sufficient.  During my evaluation to bedside patient reported that he is unable to lie flat due to short of breath and walks too far he has to catch his breath when he due to short of breath.  Patient denies any new change of lower extremity swelling reported he has chronic right lower extremity swelling.  Patient also reporting nonproductive cough.  Denies any fever, chill abdominal pain, nausea, vomiting and diarrhea.  No other complaint at this time.  Significant labs in the ED: Lab Orders         Culture, blood (Routine X 2) w Reflex to ID Panel         Resp panel by RT-PCR (RSV, Flu A&B, Covid) Anterior Nasal Swab         Expectorated Sputum Assessment w Gram Stain,  Rflx to Resp Cult         CBC with Differential/Platelet         Comprehensive metabolic panel with GFR         Pro Brain natriuretic peptide         Lactic acid, plasma         Strep pneumoniae urinary antigen         Legionella Pneumophila Serogp 1 Ur Ag         Procalcitonin         CBC         Comprehensive metabolic panel       Review of Systems:  Review of Systems  Constitutional:  Negative for chills, fever, malaise/fatigue and weight loss.  Respiratory:  Positive for cough, sputum production and shortness of breath. Negative for hemoptysis and wheezing.   Cardiovascular:  Positive for orthopnea and PND. Negative for chest pain, palpitations and leg swelling.  Gastrointestinal:  Negative for abdominal pain, diarrhea, heartburn, nausea and vomiting.  Genitourinary:  Negative for dysuria.  Musculoskeletal:  Negative for falls and myalgias.  Neurological:  Negative for dizziness and headaches.  Psychiatric/Behavioral:  The patient is not nervous/anxious.     Past Medical History:  Diagnosis Date   Allergy     Anxiety    B12 deficiency anemia    Blood transfusion without reported diagnosis    CAD (coronary artery disease)    Cancer (HCC)    bladder, Right renal cell also   Chronic kidney disease    Colon polyps    COPD (chronic obstructive pulmonary disease) (HCC)    Depression    Diabetes mellitus (HCC)    Dyspnea    Esophagus, Barrett's    GERD (gastroesophageal reflux disease)    Headache    History of bladder cancer    Bladder cancer 8 times   History of hiatal hernia    Hyperlipidemia    Hypertension    Localized osteoarthrosis, lower leg    Myocardial infarction (HCC) 2021   Pneumonia    Restless leg syndrome    Sleep apnea    does not wear cpap   Stenosis of esophagus     Past Surgical History:  Procedure Laterality Date   ABDOMINAL AORTOGRAM N/A 08/03/2023   Procedure: ABDOMINAL AORTOGRAM;  Surgeon: Serene Gaile ORN, MD;  [facility];  Service: Cardiovascular;  Laterality: N/A;   BILIARY BRUSHING  04/01/2018   Procedure: BILIARY BRUSHING;  Surgeon: Wilhelmenia Aloha Raddle., MD;  Location: [facility] ENDOSCOPY;  Service: Gastroenterology;;   BILIARY BRUSHING  09/12/2018   Procedure: BILIARY BRUSHING;  Surgeon: Wilhelmenia Aloha Raddle., MD;  Location: [facility] ENDOSCOPY;  Service: Gastroenterology;;   BILIARY BRUSHING  11/28/2018   Procedure: BILIARY BRUSHING;  Surgeon: Wilhelmenia Aloha Raddle., MD;  Location: [facility] ENDOSCOPY;  Service: Gastroenterology;;   BILIARY BRUSHING  03/11/2020   Procedure: BILIARY BRUSHING;  Surgeon: Wilhelmenia Aloha Raddle., MD;  Location: [facility] ENDOSCOPY;  Service: Gastroenterology;;   BILIARY DILATION  09/12/2018   Procedure: BILIARY DILATION;  Surgeon: Wilhelmenia Aloha Raddle., MD;  Location: [facility] ENDOSCOPY;  Service:  Gastroenterology;;   BILIARY DILATION  11/28/2018   Procedure: BILIARY DILATION;  Surgeon: Wilhelmenia Aloha Raddle., MD;  Location: [facility] ENDOSCOPY;  Service: Gastroenterology;;   BILIARY DILATION  03/11/2020   Procedure: BILIARY DILATION;  Surgeon: Wilhelmenia Aloha Raddle., MD;  Location: [facility] ENDOSCOPY;  Service: Gastroenterology;;   BILIARY STENT PLACEMENT  04/01/2018   Procedure: BILIARY STENT PLACEMENT;  Surgeon: Wilhelmenia Aloha Raddle., MD;  Location: [facility] ENDOSCOPY;  Service: Gastroenterology;;   BILIARY STENT PLACEMENT  09/12/2018   Procedure: BILIARY STENT PLACEMENT;  Surgeon: Wilhelmenia Aloha Raddle., MD;  Location: [facility] ENDOSCOPY;  Service: Gastroenterology;;   BILIARY STENT PLACEMENT  11/28/2018   Procedure: BILIARY STENT PLACEMENT;  Surgeon: Wilhelmenia Aloha Raddle., MD;  Location: [facility] ENDOSCOPY;  Service: Gastroenterology;;   BIOPSY  04/01/2018   Procedure: BIOPSY;  Surgeon: Wilhelmenia Aloha Raddle., MD;  Location: [facility] ENDOSCOPY;  Service: Gastroenterology;;   BIOPSY  09/12/2018   Procedure: BIOPSY;  Surgeon: Wilhelmenia Aloha Raddle., MD;  Location: [facility] ENDOSCOPY;  Service: Gastroenterology;;   BIOPSY   11/28/2018   Procedure: BIOPSY;  Surgeon: Wilhelmenia Aloha Raddle., MD;  Location: [facility] ENDOSCOPY;  Service: Gastroenterology;;   BIOPSY  03/11/2020   Procedure: BIOPSY;  Surgeon: Wilhelmenia Aloha Raddle., MD;  Location: [facility] ENDOSCOPY;  Service: Gastroenterology;;   bladder cancer      x 8 cystoscopy   CERVICAL DISCECTOMY     ACDF   CHOLECYSTECTOMY     COLONOSCOPY  11/17/2005   normal    CORONARY ARTERY BYPASS GRAFT     x4   CORONARY STENT INTERVENTION N/A 01/05/2019   Procedure: CORONARY STENT INTERVENTION;  Surgeon: Mady Bruckner, MD;  [facility]: [facility]AS[facility] CV [facility];  Service: Cardiovascular;  Laterality: N/A;   CYSTOSCOPY WITH BIOPSY N/A 03/27/2022   Procedure: CYSTOSCOPY WITH BIOPSY;  Surgeon: Watt Rush, MD;  Location: [facility] ORS;  Service: Urology;  Laterality: N/A;   CYSTOSCOPY WITH RETROGRADE PYELOGRAM, URETEROSCOPY AND STENT PLACEMENT Right 03/27/2022   Procedure: CYSTOSCOPY WITH RIGHT RETROGRADE PYELOGRAM, URETEROSCOPY AND STENT PLACEMENT urethral dilation;  Surgeon: Watt Rush, MD;  Location: [facility] ORS;  Service: Urology;  Laterality: Right;   ENDOSCOPIC MUCOSAL RESECTION  09/12/2018   Procedure: ENDOSCOPIC MUCOSAL RESECTION;  Surgeon: Wilhelmenia Aloha Raddle., MD;  Location: [facility] ENDOSCOPY;  Service: Gastroenterology;;   ENDOSCOPIC RETROGRADE CHOLANGIOPANCREATOGRAPHY (ERCP) WITH PROPOFOL  N/A 04/01/2018   Procedure: ENDOSCOPIC RETROGRADE CHOLANGIOPANCREATOGRAPHY (ERCP) WITH PROPOFOL ;  Surgeon: Wilhelmenia Aloha Raddle., MD;  Location: [facility] ENDOSCOPY;  Service: Gastroenterology;  Laterality: N/A;   ENDOSCOPIC RETROGRADE CHOLANGIOPANCREATOGRAPHY (ERCP) WITH PROPOFOL  N/A 09/12/2018   Procedure: ENDOSCOPIC RETROGRADE CHOLANGIOPANCREATOGRAPHY (ERCP) WITH PROPOFOL ;  Surgeon: Wilhelmenia Aloha Raddle., MD;  Location: [facility] ENDOSCOPY;  Service: Gastroenterology;  Laterality: N/A;   ENDOSCOPIC RETROGRADE CHOLANGIOPANCREATOGRAPHY (ERCP) WITH PROPOFOL  N/A 03/11/2020   Procedure: ENDOSCOPIC RETROGRADE  CHOLANGIOPANCREATOGRAPHY (ERCP) WITH PROPOFOL ;  Surgeon: Wilhelmenia Aloha Raddle., MD;  Location: [facility] ENDOSCOPY;  Service: Gastroenterology;  Laterality: N/A;   ENDOSCOPIC RETROGRADE CHOLANGIOPANCREATOGRAPHY (ERCP) WITH PROPOFOL  N/A 06/03/2020   Procedure: ENDOSCOPIC RETROGRADE CHOLANGIOPANCREATOGRAPHY (ERCP) WITH PROPOFOL ;  Surgeon: Wilhelmenia Aloha Raddle., MD;  Location: [facility] ENDOSCOPY;  Service: Gastroenterology;  Laterality: N/A;   ERCP N/A 11/28/2018   Procedure: ENDOSCOPIC RETROGRADE CHOLANGIOPANCREATOGRAPHY (ERCP) +EGD with spyglass;  Surgeon: Wilhelmenia Aloha Raddle., MD;  Location: [facility] ENDOSCOPY;  Service: Gastroenterology;  Laterality: N/A;   ESOPHAGOGASTRODUODENOSCOPY  04/29/2010   ESOPHAGOGASTRODUODENOSCOPY (EGD) WITH PROPOFOL  N/A 04/01/2018   Procedure: ESOPHAGOGASTRODUODENOSCOPY (EGD) WITH PROPOFOL ;  Surgeon: Wilhelmenia Aloha Raddle., MD;  Location: [facility] ENDOSCOPY;  Service: Gastroenterology;  Laterality: N/A;   ESOPHAGOGASTRODUODENOSCOPY (EGD) WITH  PROPOFOL  N/A 09/12/2018   Procedure: ESOPHAGOGASTRODUODENOSCOPY (EGD) WITH PROPOFOL ;  Surgeon: Wilhelmenia Aloha Raddle., MD;  Location: [facility] ENDOSCOPY;  Service: Gastroenterology;  Laterality: N/A;   ESOPHAGOGASTRODUODENOSCOPY (EGD) WITH PROPOFOL  N/A 11/28/2018   Procedure: ESOPHAGOGASTRODUODENOSCOPY (EGD) WITH PROPOFOL ;  Surgeon: Wilhelmenia Aloha Raddle., MD;  Location: [facility] ENDOSCOPY;  Service: Gastroenterology;  Laterality: N/A;   ESOPHAGOGASTRODUODENOSCOPY (EGD) WITH PROPOFOL  N/A 03/11/2020   Procedure: ESOPHAGOGASTRODUODENOSCOPY (EGD) WITH PROPOFOL ;  Surgeon: Wilhelmenia Aloha Raddle., MD;  Location: [facility] ENDOSCOPY;  Service: Gastroenterology;  Laterality: N/A;   ESOPHAGOGASTRODUODENOSCOPY (EGD) WITH PROPOFOL  N/A 06/03/2020   Procedure: ESOPHAGOGASTRODUODENOSCOPY (EGD) WITH PROPOFOL ;  Surgeon: Wilhelmenia Aloha Raddle., MD;  Location: [facility] ENDOSCOPY;  Service: Gastroenterology;  Laterality: N/A;   EUS  04/01/2018   Procedure: FULL UPPER ENDOSCOPIC ULTRASOUND  (EUS) RADIAL;  Surgeon: Wilhelmenia Aloha Raddle., MD;  Location: [facility] ENDOSCOPY;  Service: Gastroenterology;;   EUS N/A 09/12/2018   Procedure: UPPER ENDOSCOPIC ULTRASOUND (EUS) RADIAL;  Surgeon: Wilhelmenia Aloha Raddle., MD;  Location: [facility] ENDOSCOPY;  Service: Gastroenterology;  Laterality: N/A;   FINE NEEDLE ASPIRATION  09/12/2018   Procedure: FINE NEEDLE ASPIRATION (FNA) LINEAR;  Surgeon: Wilhelmenia Aloha Raddle., MD;  Location: [facility] ENDOSCOPY;  Service: Gastroenterology;;   HAND SURGERY Left 2018   saw accident   HEMOSTASIS CLIP PLACEMENT  09/12/2018   Procedure: HEMOSTASIS CLIP PLACEMENT;  Surgeon: Wilhelmenia Aloha Raddle., MD;  Location: [facility] ENDOSCOPY;  Service: Gastroenterology;;   HEMOSTASIS CLIP PLACEMENT  06/03/2020   Procedure: HEMOSTASIS CLIP PLACEMENT;  Surgeon: Wilhelmenia Aloha Raddle., MD;  Location: [facility] ENDOSCOPY;  Service: Gastroenterology;;   I & D EXTREMITY Left 11/24/2016   Procedure: IRRIGATION AND DEBRIDEMENT LEFT HAND, THUMB, INDEX, MIDDLE, RING, AND SMALL FINGERS WITH RECONSTRUCTION;  Surgeon: Camella Fallow, MD;  Location: [facility];  Service: Orthopedics;  Laterality: Left;   KNEE ARTHROSCOPY Left    LEFT HEART CATH AND CORS/GRAFTS ANGIOGRAPHY N/A 01/05/2019   Procedure: LEFT HEART CATH AND CORS/GRAFTS ANGIOGRAPHY;  Surgeon: Mady Bruckner, MD;  Location: [facility];  Service: Cardiovascular;  Laterality: N/A;   LUMBAR LAMINECTOMY     and fusion x 2   NASAL SINUS SURGERY     POPLITEAL SYNOVIAL CYST EXCISION     REMOVAL OF STONES  04/01/2018   Procedure: REMOVAL OF STONES;  Surgeon: Wilhelmenia Aloha Raddle., MD;  Location: [facility] ENDOSCOPY;  Service: Gastroenterology;;   REMOVAL OF STONES  09/12/2018   Procedure: REMOVAL OF STONES;  Surgeon: Wilhelmenia Aloha Raddle., MD;  Location: [facility] ENDOSCOPY;  Service: Gastroenterology;;   REMOVAL OF STONES  11/28/2018   Procedure: REMOVAL OF STONES;  Surgeon: Wilhelmenia Aloha Raddle., MD;  Location: [facility] ENDOSCOPY;  Service: Gastroenterology;;    REMOVAL OF STONES  03/11/2020   Procedure: REMOVAL OF STONES;  Surgeon: Wilhelmenia Aloha Raddle., MD;  Location: [facility]OSCOPY;  Service: Gastroenterology;;   REMOVAL OF STONES  06/03/2020   Procedure: REMOVAL OF STONES;  Surgeon: Wilhelmenia Aloha Raddle., MD;  Location: [facility] ENDOSCOPY;  Service: Gastroenterology;;   ROBOT ASSITED LAPAROSCOPIC NEPHROURETERECTOMY Right 06/03/2022   Procedure: XI ROBOT ASSITED LAPAROSCOPIC NEPHROURETERECTOMY, FLEXIBLE CYSTOSCOPY;  Surgeon: Alvaro Ricardo KATHEE Raddle., MD;  Location: [facility] ORS;  Service: Urology;  Laterality: Right;  3 HRS   SAVORY DILATION N/A 09/12/2018   Procedure: SAVORY DILATION;  Surgeon: Wilhelmenia Aloha Raddle., MD;  Location: [facility] ENDOSCOPY;  Service: Gastroenterology;  Laterality: N/A;   SAVORY DILATION N/A 11/28/2018   Procedure: SAVORY DILATION;  Surgeon: Wilhelmenia Aloha Raddle., MD;  Location: [facility] ENDOSCOPY;  Service: Gastroenterology;  Laterality: N/A;  SEPTOPLASTY Bilateral 05/26/2021   Procedure: SEPTOPLASTY;  Surgeon: Karis Clunes, MD;  Location: [facility];  Service: ENT;  Laterality: Bilateral;   SPHINCTEROTOMY  04/01/2018   Procedure: ANNETT;  Surgeon: Mansouraty, Aloha Raddle., MD;  Location: [facility] ENDOSCOPY;  Service: Gastroenterology;;   LAHOMA CHOLANGIOSCOPY N/A 11/28/2018   Procedure: DEBHOJDD CHOLANGIOSCOPY;  Surgeon: Wilhelmenia Aloha Raddle., MD;  Location: [facility] ENDOSCOPY;  Service: Gastroenterology;  Laterality: N/A;   SPYGLASS CHOLANGIOSCOPY N/A 06/03/2020   Procedure: SPYGLASS CHOLANGIOSCOPY;  Surgeon: Wilhelmenia Aloha Raddle., MD;  Location: [facility] ENDOSCOPY;  Service: Gastroenterology;  Laterality: N/A;   STENT REMOVAL  09/12/2018   Procedure: STENT REMOVAL;  Surgeon: Wilhelmenia Aloha Raddle., MD;  Location: [facility] ENDOSCOPY;  Service: Gastroenterology;;   CLEDA REMOVAL  11/28/2018   Procedure: STENT REMOVAL;  Surgeon: Wilhelmenia Aloha Raddle., MD;  Location: [facility] ENDOSCOPY;  Service: Gastroenterology;;   CLEDA REMOVAL  03/11/2020    Procedure: STENT REMOVAL;  Surgeon: Wilhelmenia Aloha Raddle., MD;  Location: [facility] ENDOSCOPY;  Service: Gastroenterology;;   SUBMUCOSAL LIFTING INJECTION  09/12/2018   Procedure: SUBMUCOSAL LIFTING INJECTION;  Surgeon: Wilhelmenia Aloha Raddle., MD;  Location: [facility] ENDOSCOPY;  Service: Gastroenterology;;     reports that he quit smoking about 47 years ago. His smoking use included cigarettes. He started smoking about 52 years ago. He has a 2.5 pack-year smoking history. He has never used smokeless tobacco. He reports that he does not drink alcohol and does not use drugs.  No Known Allergies  Family History  Problem Relation Age of Onset   Melanoma Mother    Stroke Father    Hypertension Father    Coronary artery disease Other    Colon cancer Neg Hx    Esophageal cancer Neg Hx    Stomach cancer Neg Hx    Rectal cancer Neg Hx    Pancreatic cancer Neg Hx    Liver disease Neg Hx    Inflammatory bowel disease Neg Hx     Prior to Admission medications   Medication Sig Start Date End Date Taking? Authorizing Provider  acetaminophen  (TYLENOL ) 500 MG tablet Take 1,000 mg by mouth every 6 (six) hours as needed for moderate pain.    [provider]  albuterol  (VENTOLIN  HFA) 108 (90 Base) MCG/ACT inhaler Inhale 2 puffs into the lungs every 6 (six) hours as needed for wheezing or shortness of breath. 08/11/23   Hunsucker, Donnice SAUNDERS, MD  amLODipine  (NORVASC ) 5 MG tablet TAKE 1 TABLET BY MOUTH DAILY 08/23/23   Theophilus Andrews, Tully GRADE, MD  aspirin  EC 81 MG tablet Take 81 mg by mouth every evening. Swallow whole.    [provider]  clopidogrel  (PLAVIX ) 75 MG tablet TAKE 1 TABLET BY MOUTH ONCE  DAILY 03/11/23   Burnard Debby LABOR, MD  Coenzyme Q10 (CO Q-10) 300 MG CAPS Take 300 mg by mouth daily.    [provider]  cyanocobalamin  (VITAMIN B12) 1000 MCG/ML injection Inject 1 mL (1,000 mcg total) into the muscle every 30 (thirty) days. 07/20/23   Theophilus Andrews, Tully GRADE, MD   cyclobenzaprine  (FLEXERIL ) 5 MG tablet Take 1 tablet (5 mg total) by mouth at bedtime as needed for muscle spasms. 07/20/23   Theophilus Andrews, Tully GRADE, MD  ezetimibe  (ZETIA ) 10 MG tablet TAKE 1 TABLET BY MOUTH DAILY 07/19/23   Theophilus Andrews, Tully GRADE, MD  Fe Bisgly-Vit C-Vit B12-FA (GENTLE IRON PO) Take 1 tablet by mouth daily.    [provider]  fluticasone  (FLONASE ) 50 MCG/ACT nasal spray  Place 1 spray into both nostrils daily as needed for allergies.    [provider]  Fluticasone -Umeclidin-Vilant (TRELEGY ELLIPTA ) 200-62.5-25 MCG/ACT AEPB Inhale 1 puff into the lungs daily. 08/03/23   Hunsucker, Donnice SAUNDERS, MD  isosorbide  mononitrate (IMDUR ) 60 MG 24 hr tablet TAKE 1 TABLET BY MOUTH DAILY 03/11/23   Burnard Debby LABOR, MD  Menthol, Topical Analgesic, (BENGAY EX) Apply 1 application topically daily as needed (pain).    [provider]  Multiple Vitamin (MULTIVITAMIN) tablet Take 1 tablet by mouth daily.    [provider]  nebivolol  (BYSTOLIC ) 10 MG tablet TAKE 1 TABLET BY MOUTH DAILY 05/11/23   Burnard Debby LABOR, MD  NEEDLE, DISP, 25 G (B-D DISP NEEDLE 25GX1) 25G X 1 MISC Inject 1000 mcg into muscle once a month. 07/20/23   Theophilus Andrews, Tully GRADE, MD  nitroGLYCERIN  (NITROSTAT ) 0.4 MG SL tablet PLACE 1 TABLET UNDER THE TONGUE EVERY 5 MINUTES AS NEEDED FOR CHEST PAIN 07/20/23   Theophilus Andrews, Tully GRADE, MD  oxyCODONE -acetaminophen  (PERCOCET) 10-325 MG tablet Take 1 tablet by mouth 2 (two) times daily. Patient taking differently: Take 0.5 tablets by mouth 3 (three) times daily. 05/12/23   Theophilus Andrews, Tully GRADE, MD  pantoprazole  (PROTONIX ) 40 MG tablet Take 1 tablet (40 mg total) by mouth daily. 08/17/23   Craig Alan SAUNDERS, PA-C  rOPINIRole  (REQUIP ) 2 MG tablet TAKE 1 TABLET BY MOUTH AT  BEDTIME 05/24/23   Theophilus Andrews, Tully GRADE, MD  rosuvastatin  (CRESTOR ) 10 MG tablet TAKE 1 TABLET BY MOUTH DAILY 05/11/23   Burnard Debby LABOR, MD  saccharomyces boulardii  (FLORASTOR) 250 MG capsule Take 250 mg by mouth daily.    [provider]  venlafaxine  (EFFEXOR ) 100 MG tablet Take 1 tablet (100 mg total) by mouth daily. 07/20/23   Theophilus Andrews, Tully GRADE, MD  venlafaxine  (EFFEXOR ) 75 MG tablet Take 150 mg by mouth daily. Patient not taking: Reported on 08/11/2023    [provider]     Physical Exam: Vitals:   09/08/23 0004 09/08/23 0053 09/08/23 0210 09/08/23 0416  BP:  (!) 131/97  (!) 137/104  Pulse:  78  78  Resp:      Temp: 98.4 F (36.9 C) 98.6 F (37 C)  98.1 F (36.7 C)  TempSrc: Oral Oral  Oral  SpO2:  95%  92%  Weight:   70.3 kg   Height:   5' 9 (1.753 m)     Physical Exam Vitals and nursing note reviewed.  Constitutional:      Appearance: He is well-developed.  HENT:     Mouth/Throat:     Mouth: Mucous membranes are moist.  Cardiovascular:     Rate and Rhythm: Normal rate and regular rhythm.  Pulmonary:     Effort: Pulmonary effort is normal.     Breath sounds: Normal breath sounds. No decreased breath sounds, wheezing, rhonchi or rales.  Musculoskeletal:     Cervical back: Normal range of motion and neck supple.     Comments: Bilateral lower extremity trace edema  Skin:    Capillary Refill: Capillary refill takes less than 2 seconds.  Neurological:     Mental Status: He is alert and oriented to person, place, and time.  Psychiatric:        Mood and Affect: Mood normal.      Labs on Admission: I have personally reviewed following labs and imaging studies  CBC: Recent Labs  Lab 09/07/23 2019  WBC 15.2*  NEUTROABS 11.8*  HGB 10.6*  HCT 32.8*  MCV 88.9  PLT 203   Basic Metabolic Panel: Recent Labs  Lab 09/07/23 2019  NA 140  K 4.4  CL 102  CO2 24  GLUCOSE 126*  BUN 37*  CREATININE 2.65*  CALCIUM  8.6*   GFR: Estimated Creatinine Clearance: 21.7 mL/min (A) (by C-G formula based on SCr of 2.65 mg/dL (H)). Liver Function Tests: Recent Labs  Lab 09/07/23 2019  AST 37  ALT 18   ALKPHOS 89  BILITOT 0.6  PROT 7.4  ALBUMIN  4.0   No results for input(s): LIPASE, AMYLASE in the last 168 hours. No results for input(s): AMMONIA in the last 168 hours. Coagulation Profile: No results for input(s): INR, PROTIME in the last 168 hours. Cardiac Enzymes: No results for input(s): CKTOTAL, CKMB, CKMBINDEX, TROPONINI, TROPONINIHS in the last 168 hours. BNP (last 3 results) No results for input(s): BNP in the last 8760 hours. HbA1C: No results for input(s): HGBA1C in the last 72 hours. CBG: No results for input(s): GLUCAP in the last 168 hours. Lipid Profile: No results for input(s): CHOL, HDL, LDLCALC, TRIG, CHOLHDL, LDLDIRECT in the last 72 hours. Thyroid  Function Tests: No results for input(s): TSH, T4TOTAL, FREET4, T3FREE, THYROIDAB in the last 72 hours. Anemia Panel: No results for input(s): VITAMINB12, FOLATE, FERRITIN, TIBC, IRON, RETICCTPCT in the last 72 hours. Urine analysis:    Component Value Date/Time   COLORURINE YELLOW 07/07/2023 1426   APPEARANCEUR CLEAR 07/07/2023 1426   LABSPEC 1.014 07/07/2023 1426   PHURINE 5.0 07/07/2023 1426   GLUCOSEU NEGATIVE 07/07/2023 1426   HGBUR NEGATIVE 07/07/2023 1426   BILIRUBINUR NEGATIVE 07/07/2023 1426   BILIRUBINUR 3 06/21/2015 0838   KETONESUR NEGATIVE 07/07/2023 1426   PROTEINUR 100 (A) 07/07/2023 1426   UROBILINOGEN 0.2 06/21/2015 0838   NITRITE NEGATIVE 07/07/2023 1426   LEUKOCYTESUR NEGATIVE 07/07/2023 1426    Radiological Exams on Admission: I have personally reviewed images CT Chest Wo Contrast Result Date: 09/07/2023 CLINICAL DATA:  Pneumonia, complications suspected, same date chest radiograph done EXAM: CT CHEST WITHOUT CONTRAST TECHNIQUE: Multidetector CT imaging of the chest was performed following the standard protocol without IV contrast. RADIATION DOSE REDUCTION: This exam was performed according to the departmental dose-optimization  program which includes automated exposure control, adjustment of the mA and/or kV according to patient size and/or use of iterative reconstruction technique. COMPARISON:  Same day chest radiograph FINDINGS: Cardiovascular: Sequelae of prior CABG. Normal caliber thoracic aorta. No pericardial effusion. Mediastinum/Nodes: Prominent mediastinal lymphadenopathy throughout, with the right paratracheal lymph node measuring up to 1.3 cm in short axis. Lungs/Pleura: Multifocal ground-glass opacities bilaterally with some septal wall thickening suggestive of pulmonary edema. There are also multifocal consolidative opacities, right greater than left. Moderate right pleural effusion. Trace left pleural effusion with loculated fluid in the left major fissure. Upper Abdomen: Pneumobilia. Status post cholecystectomy. Status post right nephrectomy. Musculoskeletal: Partially imaged ACDF hardware. Status post sternotomy. No acute findings. IMPRESSION: 1. Findings suggestive of pulmonary edema with moderate right pleural effusion and loculated fluid in the left major fissure. There are also multifocal consolidative opacities in the bilateral lungs, right greater than left, which could be seen with superimposed infection in the appropriate clinical setting. 2.  Mediastinal lymphadenopathy, likely reactive. Aortic Atherosclerosis (ICD10-I70.0). Electronically Signed   By: Michaeline Blanch M.D.   On: 09/07/2023 22:03   DG Chest Port 1 View Result Date: 09/07/2023 CLINICAL DATA:  10026. Shortness of breath worsening over the past 2 days. EXAM: PORTABLE CHEST 1  VIEW COMPARISON:  Portable chest 07/07/2023. FINDINGS: 8:26 p.m. there is chronic pleural-parenchymal disease in the left lower lung field. Small pleural effusions are beginning to form. The heart is slightly enlarged with CABG changes again noted and increased central vascular prominence. There is mild generalized interstitial consolidation consistent with interstitial edema.  Findings consistent with CHF or fluid overload. There is patchy increased opacity in the left lower lung field which could be atelectasis, pneumonia or alveolar edema. No other focal infiltrate is seen. The mediastinum is normally outlined. Aortic atherosclerosis. Multilevel lower cervical fusion plating is again shown. Degenerative change thoracic spine. IMPRESSION: 1. Findings consistent with mild CHF or fluid overload. 2. Patchy increased opacity in the left lower lung field which could be atelectasis, pneumonia or alveolar edema. 3. Chronic pleural-parenchymal disease in the left lower lung field. 4. Aortic atherosclerosis. Electronically Signed   By: Francis Quam M.D.   On: 09/07/2023 20:38     EKG: My personal interpretation of EKG shows: Normal sinus rhythm heart rate 79    Assessment/Plan: Principal Problem:   CHF exacerbation (HCC) Active Problems:   Multifocal pneumonia   Depression   Essential hypertension   Restless leg syndrome   GERD (gastroesophageal reflux disease)   Diabetes mellitus type 2, noninsulin dependent (HCC)   History of pancreatitis   History of biliary stent insertion   Sepsis due to pneumonia (HCC)   Hyperlipidemia   Elevated brain natriuretic peptide (BNP) level   CKD (chronic kidney disease), stage IV (HCC)   History of CAD (coronary artery disease)   History of anxiety   History of renal cell cancer s/p  nephrectomy   Chronic pain syndrome    Assessment and Plan: New onset of CHF with exacerbation Essential hypertension -Presented emergency department complaining of orthopnea, dyspnea and PND which has been progressively getting worse.  Patient reported that at home he took sublingual nitroglycerin  with improvement of some shortness of breath however it again came back.  Patient denies any chest pain. -At presentation to ED patient found febrile, tachypneic, tachycardic, leukocytosis, normal lactic acid and chest x-ray showing and CT scan showed  evidence of both pulmonary vascular congestion and pneumonia. - Found to have elevated BNP 13,000.  First troponin 47 pending second troponin.  EKG showing normal sinus rhythm heart rate 79. - As patient meets sepsis criteria in the ED initially he received 1 L of LR bolus afterward received Lasix  40 mg p.o. then placed on maintenance fluid.  Also treated with Levaquin . - The setting of acute CHF exacerbation holding further IV fluids as patient blood pressure is normal, hemodynamically stable and normal lactic acid level. - If patient blood pressure allow will continue further IV Lasix  based on volume status.  Strict I's/O, daily weight and monitor urine output - Obtaining echocardiogram - Holding amlodipine  in the setting of sepsis.  Continue BiDil to prevent reflex tachycardia and continue Imdur . -Continue cardiac monitoring   Multifocal pneumonia Sepsis secondary to pneumonia -At presentation to ED patient found febrile, tachypneic, tachycardic, leukocytosis, normal lactic acid and chest x-ray showing and CT scan showed evidence of both pulmonary vascular congestion and pneumonia. -Patient meets sepsis criteria. - Respiratory panel negative for COVID test, RSV/flu. - Chest x-ray showing pulmonary vascular congestion and multifocal infiltrate. -CT scan showed multifocal pneumonia, pulmonary vascular congestion.  And left major fissure loculated fluid. - Discussed with pulmonology/PCCM Dr. Maree there is no significant fluid to drain for chest tube neither thoracentesis. However he is concerned that there  is some right lobe consolidation which could be pneumonia versus malignancy recommended repeat CT chest in 4 weeks and patient has to follow-up with pulmonology outpatient regarding the findings. -Pending blood culture, sputum culture, procalcitonin, urine Legionella and urine strep antigen test. - Continue IV ceftriaxone  and doxycycline . - Holding off further IV fluid as hemodynamically  stable.  -Continue supportive care.  Elevated troponin-secondary to demand ischemia -Troponin 47.  EKG showing normal sinus rhythm heart rate 79.  There is no history of abnormality.  Patient denies any chest pain. - At this time there is no concern for acute coronary syndrome. -Will follow-up with second troponin level  History of CAD status post CABG - Continue aspirin , Plavix , BiDil, Zetia , Imdur  and Crestor .  History of COPD - Stable.  Continue DuoNeb as needed and Breztri  twice daily.  History of renal cancer status post nephrectomy CKD stage IV -Creatinine 2.65 and GFR 23.  Renal function at baseline.  History of depression History of anxiety -Continue Effexor   History of non-insulin -dependent DM type II History of DM type II diet controlled.  A1c is 5.7.  Hyperlipidemia -Continue Zetia  and Crestor   Chronic pain syndrome - Continue Percocet twice daily as needed  Restless leg syndrome - Continue ropinirole   DVT prophylaxis:  SQ Heparin  Code Status:  Full Code Diet: Heart healthy carb modified Family Communication:   Family was present at bedside, at the time of interview. Opportunity was given to ask question and all questions were answered satisfactorily.  Disposition Plan: Based on the volume status and blood pressure trend can continue IV Lasix .  Pending culture results per pneumonia. Consults: None indicated at this time Admission status:   Inpatient, Telemetry bed  Severity of Illness: The appropriate patient status for this patient is INPATIENT. Inpatient status is judged to be reasonable and necessary in order to provide the required intensity of service to ensure the patient's safety. The patient's presenting symptoms, physical exam findings, and initial radiographic and laboratory data in the context of their chronic comorbidities is felt to place them at high risk for further clinical deterioration. Furthermore, it is not anticipated that the patient will  be medically stable for discharge from the hospital within 2 midnights of admission.   * I certify that at the point of admission it is my clinical judgment that the patient will require inpatient hospital care spanning beyond 2 midnights from the point of admission due to high intensity of service, high risk for further deterioration and high frequency of surveillance required.DEWAINE    Mickie Badders, MD [facility]  How to contact the [facility] Attending or Consulting provider 7A - 7P or covering provider during after hours 7P -7A, for this patient.  Check the care team in [facility] and look for a) attending/consulting TR[facility] provider listed and b) the [facility] team listed Log into [email or web address] and use [place]'s universal password to access. If you do not have the password, please contact the hospital operator. Locate the [facility] provider you are looking for under [facility] and page to a number that you can be directly reached. If you still have difficulty reaching the provider, please page the [facility] (Director on Call) for the Hospitalists listed on amion for assistance.  09/08/2023, 4:35 AM

## 2023-09-08 NOTE — Plan of Care (Signed)
  Problem: Education: Goal: Knowledge of General Education information will improve Description: Including pain rating scale, medication(s)/side effects and non-pharmacologic comfort measures Outcome: Progressing   Problem: Health Behavior/Discharge Planning: Goal: Ability to manage health-related needs will improve Outcome: Progressing   Problem: Clinical Measurements: Goal: Ability to maintain clinical measurements within normal limits will improve Outcome: Progressing Goal: Diagnostic test results will improve Outcome: Progressing   Problem: Clinical Measurements: Goal: Diagnostic test results will improve Outcome: Progressing   Problem: Activity: Goal: Risk for activity intolerance will decrease Outcome: Progressing   Problem: Nutrition: Goal: Adequate nutrition will be maintained Outcome: Progressing   Problem: Pain Managment: Goal: General experience of comfort will improve and/or be controlled Outcome: Progressing   Problem: Safety: Goal: Ability to remain free from injury will improve Outcome: Progressing   Problem: Education: Goal: Ability to demonstrate management of disease process will improve Outcome: Progressing Goal: Ability to verbalize understanding of medication therapies will improve Outcome: Progressing   Problem: Cardiac: Goal: Ability to achieve and maintain adequate cardiopulmonary perfusion will improve Outcome: Progressing   Problem: Activity: Goal: Ability to tolerate increased activity will improve Outcome: Progressing   Problem: Clinical Measurements: Goal: Ability to maintain a body temperature in the normal range will improve Outcome: Progressing

## 2023-09-08 NOTE — Hospital Course (Addendum)
 83 y.o. M with CAD s/p CABG, COPD not on home O2, HTN, RCC s/p nephrectomy, CKD IV baseline 2.8 who presented with SOB, found to have CHF flare and pneumonia and new onset Aflutter as well as abnormal chest CT.

## 2023-09-08 NOTE — ED Notes (Signed)
 [facility] at bedside

## 2023-09-08 NOTE — Telephone Encounter (Signed)
 I saw that this was the case. I am sorry to hear about this. No late cancellation fee. Place recall into the system for 4 months from now with clinic follow-up in 2 months to ensure safety for us  to plan repeat endoscopy attempt. Thanks. GM

## 2023-09-08 NOTE — TOC Initial Note (Signed)
 Transition of Care [facility]) - Initial/Assessment Note    Patient Details  Name: [name] MRN: [id number] Date of Birth: [birth year]  Transition of [facility]) CM/SW Contact:    Lauraine FORBES Saa, LCSWA Phone Number: 09/08/2023, 11:36 AM  Clinical Narrative:                  11:36 AM CSW introduced self and role to patient. Patient confirmed he resides at home alone. Patient confirmed he drives self to appointments but that daughter would provide transportation for patient at discharge. Patient denied SNF history. Patient confirmed HH history with [facility] (per chart review, also has HH history with [facility]). Patient confirmed DME (BSC, rolling walker, cane) history and that DME was paid for privately. Per chart review, patient has a PCP and insurance. Patient's preferred pharmacy's are [facility] [address] and [facility] [place].  Expected Discharge Plan: Home/Self Care Barriers to Discharge: Continued Medical Work up   Patient Goals and CMS Choice Patient states their goals for this hospitalization and ongoing recovery are:: to return home          Expected Discharge Plan and Services       Living arrangements for the past 2 months: Single Family Home                                      Prior Living Arrangements/Services Living arrangements for the past 2 months: Single Family Home Lives with:: Self Patient language and need for interpreter reviewed:: Yes        Need for Family Participation in Patient Care: No (Comment) Care giver support system in place?: No (comment) Current home services: DME Criminal Activity/Legal Involvement Pertinent to Current Situation/Hospitalization: No - Comment as needed  Activities of Daily Living   ADL Screening (condition at time of admission) Independently performs ADLs?: Yes (appropriate for developmental age) Is the patient deaf or have difficulty hearing?: No Does the patient have difficulty seeing,  even when wearing glasses/contacts?: No Does the patient have difficulty concentrating, remembering, or making decisions?: No  Permission Sought/Granted Permission sought to share information with : Family Supports Permission granted to share information with : No (Contact information on chart)  Share Information with NAME: [name]     Permission granted to share info w Relationship: Daughter  Permission granted to share info w Contact Information: [phone]  Emotional Assessment Appearance:: Appears stated age Attitude/Demeanor/Rapport: Engaged Affect (typically observed): Accepting, Adaptable, Pleasant, Appropriate, Calm, Stable Orientation: : Oriented to Self, Oriented to  Time, Oriented to Situation, Oriented to Place Alcohol / Substance Use: Not Applicable Psych Involvement: No (comment)  Admission diagnosis:  Hypoxia [R09.02] Multifocal pneumonia [J18.9] Community acquired pneumonia of left lower lobe of lung [J18.9] Sepsis, due to unspecified organism, unspecified whether acute organ dysfunction present [facility]) [A41.9] Patient Active Problem List   Diagnosis Date Noted   CHF exacerbation (HCC) 09/08/2023   Elevated brain natriuretic peptide (BNP) level 09/08/2023   CKD (chronic kidney disease), stage IV (HCC) 09/08/2023   History of CAD (coronary artery disease) 09/08/2023   History of anxiety 09/08/2023   History of renal cell cancer s/p  nephrectomy 09/08/2023   Chronic pain syndrome 09/08/2023   C. 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   Bloating 04/27/2020   Gas pain 04/27/2020   Abdominal cramping  04/27/2020   Functional abdominal pain syndrome 11/12/2019   Lower extremity edema 11/12/2019   Hyperlipidemia    History of cholecystectomy 01/19/2019   Chronic diarrhea 01/19/2019   Antiplatelet or antithrombotic long-term use 01/19/2019   Non-ST elevation (NSTEMI)  myocardial infarction [facility])    Acute hypoxemic respiratory failure (HCC) 01/04/2019   Multifocal pneumonia 01/04/2019   Sepsis due to pneumonia (HCC) 01/04/2019   Elevated troponin 01/04/2019   Acute on chronic diastolic (congestive) heart failure (HCC) 01/04/2019   Acute respiratory failure (HCC) 01/04/2019   elevated IgG4 11/01/2018   Choledocholithiasis 10/27/2018   Dilation of biliary tract 10/27/2018   Esophageal dysphagia 10/27/2018   Upper airway cough syndrome 07/28/2018   Pleural effusion on left 07/28/2018   History of pancreatitis 05/12/2018   Abnormal LFTs 05/12/2018   Abnormal findings on esophagogastroduodenoscopy (EGD) 05/12/2018   History of biliary stent insertion 05/12/2018   Anemia 05/12/2018   Biliary stricture    ESBL (extended spectrum beta-lactamase) producing bacteria infection 04/04/2018   Ascending cholangitis 04/01/2018   Diabetes mellitus type 2, noninsulin dependent (HCC) 03/31/2018   Pancreatitis 03/31/2018   Sepsis (HCC) 03/31/2018   Bacteremia due to Gram-negative bacteria 03/31/2018   Abdominal pain    Dilated bile duct    Acute biliary pancreatitis 03/30/2018   Leukocytosis 03/30/2018   CAD (coronary artery disease) 03/30/2018   Osteoarthritis of left knee 10/18/2017   Cervical radiculopathy 10/04/2017   Pain of left hand 07/19/2017   Pre-operative cardiovascular examination 06/16/2017   CKD (chronic kidney disease), stage III 06/16/2017   Carpal tunnel syndrome 05/27/2017   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 05/10/2017   Open fracture of base of middle phalanx of finger 02/17/2017   Laceration of index finger 02/17/2017   Laceration of nail bed of finger 02/17/2017   Laceration of thumb 02/17/2017   Open fracture of distal phalanx of finger 02/17/2017   Open fractures of multiple sites of phalanx of left hand 11/24/2016   B12 deficiency 05/28/2015   GERD (gastroesophageal reflux disease) 11/16/2014   Hyperlipidemia LDL goal <70  11/21/2013   DOE (dyspnea on exertion) 11/21/2013   Other dysphagia 07/14/2013   History of esophageal stricture 07/14/2013   Hx of CABG 06/05/2013   Leg pain, bilateral 10/18/2012   Restless leg syndrome 06/21/2012   ULNAR NEUROPATHY, LEFT 08/05/2009   Urinary obstruction 10/19/2008   ACTINIC KERATOSIS 06/25/2008   DRY EYE SYNDROME 10/13/2007   CARCINOMA, BLADDER, HX OF 07/02/2007   BARRETTS ESOPHAGUS 05/23/2007   Hypothyroidism 04/08/2007   Other malaise and fatigue 04/08/2007   Essential hypertension 10/12/2006   ANEMIA, B12 DEFICIENCY 09/08/2006   Depression 07/29/2006   NEUROPATHY, IDIOPATHIC PERIPHERAL NEC 07/29/2006   Allergic rhinitis 07/29/2006   LOW BACK PAIN 07/29/2006   COLONIC POLYPS 11/04/2000   PCP:  Theophilus Andrews, Tully GRADE, MD Pharmacy:   [facility] [address], [place] - [address] [facility] [place] [address] Phone: [phone] Fax: [phone]  [facility] - [place], [place] - [address] [place] [address] Phone: [phone] Fax: [phone]     Social Drivers of Health (SDOH) Social History: SDOH Screenings   Food Insecurity: No Food Insecurity (07/12/2023)  Housing: Unknown (07/12/2023)  Transportation Needs: No Transportation Needs (07/12/2023)  Utilities: Not At Risk (07/12/2023)  Depression (PHQ2-9): High Risk (07/20/2023)  Financial Resource Strain: Low Risk  (02/10/2023)  Physical Activity: Unknown (02/10/2023)  Recent Concern: Physical Activity - Inactive (02/10/2023)  Social Connections: Moderately Integrated (07/08/2023)  Stress: No Stress Concern Present (02/10/2023)  Tobacco Use: Medium Risk (09/08/2023)   SDOH Interventions:     Readmission Risk Interventions    07/08/2023    9:48 AM  Readmission Risk Prevention Plan  Transportation Screening Complete  HRI or Home Care Consult Complete  Social Work Consult for Recovery Care  Planning/Counseling Complete  Palliative Care Screening Not Applicable  Medication Review Oceanographer) Complete

## 2023-09-08 NOTE — Telephone Encounter (Signed)
 Inbound call from patients daughter stating patient had a fever last night and ended up admitted at the hospital, states that patient has fluid around his heart and pneumonia so they are needing to cancel his procedure for today at 9:30am.  Please advise  Thank you

## 2023-09-09 ENCOUNTER — Ambulatory Visit: Admitting: Internal Medicine

## 2023-09-09 ENCOUNTER — Encounter (HOSPITAL_COMMUNITY): Payer: Self-pay | Admitting: Internal Medicine

## 2023-09-09 DIAGNOSIS — I509 Heart failure, unspecified: Secondary | ICD-10-CM | POA: Diagnosis not present

## 2023-09-09 LAB — LEGIONELLA PNEUMOPHILA SEROGP 1 UR AG: L. pneumophila Serogp 1 Ur Ag: NEGATIVE

## 2023-09-09 LAB — CBC
HCT: 28.3 % — ABNORMAL LOW (ref 39.0–52.0)
Hemoglobin: 9.2 g/dL — ABNORMAL LOW (ref 13.0–17.0)
MCH: 28.6 pg (ref 26.0–34.0)
MCHC: 32.5 g/dL (ref 30.0–36.0)
MCV: 87.9 fL (ref 80.0–100.0)
Platelets: 153 K/uL (ref 150–400)
RBC: 3.22 MIL/uL — ABNORMAL LOW (ref 4.22–5.81)
RDW: 14.4 % (ref 11.5–15.5)
WBC: 10.7 K/uL — ABNORMAL HIGH (ref 4.0–10.5)
nRBC: 0 % (ref 0.0–0.2)

## 2023-09-09 LAB — COMPREHENSIVE METABOLIC PANEL WITH GFR
ALT: 14 U/L (ref 0–44)
AST: 18 U/L (ref 15–41)
Albumin: 2.6 g/dL — ABNORMAL LOW (ref 3.5–5.0)
Alkaline Phosphatase: 58 U/L (ref 38–126)
Anion gap: 13 (ref 5–15)
BUN: 41 mg/dL — ABNORMAL HIGH (ref 8–23)
CO2: 27 mmol/L (ref 22–32)
Calcium: 8.2 mg/dL — ABNORMAL LOW (ref 8.9–10.3)
Chloride: 98 mmol/L (ref 98–111)
Creatinine, Ser: 2.89 mg/dL — ABNORMAL HIGH (ref 0.61–1.24)
GFR, Estimated: 21 mL/min — ABNORMAL LOW (ref >60)
Glucose, Bld: 106 mg/dL — ABNORMAL HIGH (ref 70–99)
Potassium: 3.6 mmol/L (ref 3.5–5.1)
Sodium: 138 mmol/L (ref 135–145)
Total Bilirubin: 1 mg/dL (ref 0.0–1.2)
Total Protein: 6.1 g/dL — ABNORMAL LOW (ref 6.5–8.1)

## 2023-09-09 MED ORDER — FUROSEMIDE 10 MG/ML IJ SOLN
40.0000 mg | Freq: Once | INTRAMUSCULAR | Status: AC
  Administered 2023-09-09: 40 mg via INTRAVENOUS
  Filled 2023-09-09: qty 4

## 2023-09-09 NOTE — Progress Notes (Signed)
 Heart Failure Nurse Navigator Progress Note  PCP: Theophilus Andrews, Tully GRADE, MD PCP-Cardiologist: Prior Dr. Burnard  Admission Diagnosis: Community acquired pneumonia, Sepsis, Hypoxia  Admitted from: Home  Presentation:   [name] presented with shortness of breath for last couple of days, took some nitroglycerin  day of admission with some relief.BLE Edema, Temperature of 103 in ED.  Per family member patient was hospitalized last month for sepsis. BP 153/82, HR 79,O2 on room air 89%,  Pro BNP 13,045, Creatine 2.65. Chest x-ray showed finding consistent of CHF and fluid overload. Patchy airspace disease concern for left lower lobe pneumonia. Chronic overall parenchymal disease of the left lower lobe. Aortic atherosclerosis.   Patient was educated on the sign and symptoms of heart failure, daily weights, when to call his doctor or go to the ED. Diet/ fluid restrictions Patient reports that he eats a lot of canned foods including, potatoes, kidney beans , meals, as well as eating lunch meat, especially ham. He states that he drinks some water  and a can or two of pepsi only, because he doesn't like clear soda's.) Patients daughter , whom is a Engineer, civil (consulting), makes his pill box weekly and also gives him his medications. He doesn't know what he takes, only knows that he takes whatever pills his daughter hands Him Continued education on attending all medical appointments. Patient verbalized his understanding of education, a HF TOC appointment was scheduled per Dr. Marysue request on 09/17/2023 @ 10:30 am.   ECHO/ LVEF: 50-55%  Clinical Course:  Past Medical History:  Diagnosis Date   Allergy     Anxiety    B12 deficiency anemia    Blood transfusion without reported diagnosis    CAD (coronary artery disease)    Cancer (HCC)    bladder, Right renal cell also   Chronic kidney disease    Colon polyps    COPD (chronic obstructive pulmonary disease) (HCC)    Depression    Diabetes mellitus (HCC)     Dyspnea    Esophagus, Barrett's    GERD (gastroesophageal reflux disease)    Headache    History of bladder cancer    Bladder cancer 8 times   History of hiatal hernia    Hyperlipidemia    Hypertension    Localized osteoarthrosis, lower leg    Myocardial infarction (HCC) 2021   Pneumonia    Restless leg syndrome    Sleep apnea    does not wear cpap   Stenosis of esophagus      Social History   Socioeconomic History   Marital status: Widowed    Spouse name: Not on file   Number of children: Not on file   Years of education: Not on file   Highest education level: 12th grade  Occupational History   Not on file  Tobacco Use   Smoking status: Former    Current packs/day: 0.00    Average packs/day: 0.5 packs/day for 5.0 years (2.5 ttl pk-yrs)    Types: Cigarettes    Start date: 03/31/1971    Quit date: 03/30/1976    Years since quitting: 47.4   Smokeless tobacco: Never  Vaping Use   Vaping status: Never Used  Substance and Sexual Activity   Alcohol use: No    Alcohol/week: 0.0 standard drinks of alcohol   Drug use: No   Sexual activity: Yes  Other Topics Concern   Not on file  Social History Narrative   Not on file   Social Drivers of Health  Financial Resource Strain: Low Risk  (02/10/2023)   Overall Financial Resource Strain (CARDIA)    Difficulty of Paying Living Expenses: Not hard at all  Food Insecurity: No Food Insecurity (07/12/2023)   Hunger Vital Sign    Worried About Running Out of Food in the Last Year: Never true    Ran Out of Food in the Last Year: Never true  Transportation Needs: No Transportation Needs (07/12/2023)   PRAPARE - Administrator, Civil Service (Medical): No    Lack of Transportation (Non-Medical): No  Physical Activity: Unknown (02/10/2023)   Exercise Vital Sign    Days of Exercise per Week: 0 days    Minutes of Exercise per Session: Not on file  Recent Concern: Physical Activity - Inactive (02/10/2023)   Exercise  Vital Sign    Days of Exercise per Week: 0 days    Minutes of Exercise per Session: 30 min  Stress: No Stress Concern Present (02/10/2023)   [employer] of Occupational Health - Occupational Stress Questionnaire    Feeling of Stress : Not at all  Social Connections: Moderately Integrated (07/08/2023)   Social Connection and Isolation Panel    Frequency of Communication with Friends and Family: More than three times a week    Frequency of Social Gatherings with Friends and Family: Twice a week    Attends Religious Services: More than 4 times per year    Active Member of [employer] or Organizations: Yes    Attends Banker Meetings: More than 4 times per year    Marital Status: Widowed   Education Assessment and Provision:  Detailed education and instructions provided on heart failure disease management including the following:  Signs and symptoms of Heart Failure When to call the physician Importance of daily weights Low sodium diet Fluid restriction Medication management Anticipated future follow-up appointments  Patient education given on each of the above topics.  Patient acknowledges understanding via teach back method and acceptance of all instructions.  Education Materials:  Living Better With Heart Failure Booklet, HF zone tool, & Daily Weight Tracker Tool.  Patient has scale at home: Yes Patient has pill box at home: Yes, His daughter ( who is a Engineer, civil (consulting)) makes up his pill box weekly and also gives him the medication.     High Risk Criteria for Readmission and/or Poor Patient Outcomes: Heart failure hospital admissions (last 6 months): 0  No Show rate: 2 %  Difficult social situation: No, Lives alone Demonstrates medication adherence: Yes Primary Language: English  Literacy level: reading, writing,  Barriers of Care:   Decreased medical comprehension Diet/ fluid restrictions ( salt, soda, canned foods, lunch meat)  Daily  weights  Considerations/Referrals:   Referral made to Heart Failure Pharmacist Stewardship: NA Referral made to Heart Failure CSW/NCM TOC: NA Referral made to Heart & Vascular TOC clinic: Yes, Per Dr. Marysue  09/17/2023 @ 10:30 am.   Items for Follow-up on DC/TOC: Continued HF education Diet/ fluid restrictions/ daily weights   Stephane Haddock, BSN, RN Heart Failure Print production planner Chat Only

## 2023-09-09 NOTE — Plan of Care (Signed)
  Problem: Clinical Measurements: Goal: Respiratory complications will improve Outcome: Progressing Goal: Cardiovascular complication will be avoided Outcome: Progressing   Problem: Activity: Goal: Risk for activity intolerance will decrease Outcome: Progressing   Problem: Coping: Goal: Level of anxiety will decrease Outcome: Progressing   Problem: Elimination: Goal: Will not experience complications related to urinary retention Outcome: Progressing   Problem: Pain Managment: Goal: General experience of comfort will improve and/or be controlled Outcome: Progressing   Problem: Safety: Goal: Ability to remain free from injury will improve Outcome: Progressing

## 2023-09-09 NOTE — Progress Notes (Signed)
  Progress Note   Patient: [name] FMW:[id number] DOB: September 13, 1940 DOA: 09/07/2023     1 DOS: the patient was seen and examined on 09/09/2023 at 7:59 AM      Brief hospital course: 83 y.o. M with CAD s/p CABG, COPD not on home O2, HTN, RCC s/p nephrectomy, CKD IV baseline 2.8 who presented with SOB, found to have CHF flare and pneumonia.     Assessment and Plan: Sepsis due to community-acquired pneumonia Multifocal pneumonia Acute respiratory failure with hypoxia Presented with fever, tachypnea, tachycardia, leukocytosis, respiratory failure due to multifocal pneumonia. -Continue Rocephin  and doxycycline  - IS and flutter   Acute congestive heart failure with moderately reduced ejection fraction Essential hypertension Breathing is better after Lasix .  Echocardiogram shows EF 50 to 55%.  No significant valvular disease. - Continue IV furosemide  - Continue strict ins and outs - Continue daily BMP  Demand ischemia Coronary artery disease with chronic angina Hypertension Hyperlipidemia No further ischemic workup needed - Continue aspirin , Plavix , Zetia , Imdur , Nebivolol  Crestor  - Hold amlodipine   Lung nodule Case was discussed with pulmonology on admission, given his abnormal findings on chest CT, this could be consolidation from pneumonia versus malignancy and repeat CT chest in 4 weeks is recommended - Repeat CT chest in 4 weeks - Pulmonology follow-up at discharge  Chronic kidney disease stage IV History of renal cancer status post nephrectomy Creatinine slightly up to 2.8, but within baseline range  Diabetes Diet controlled, glucose here normal  Chronic pain syndrome - Continue home Percocet  COPD No evidence of flare -Continue Breztri   Mood disorder -Continue venlafaxine   Anemia of chronic kidney disease CBC remained stable        Subjective: Patient feeling somewhat better.  No fever overnight.  No confusion.     Physical Exam: BP  121/63 (BP Location: Right Arm)   Pulse 81   Temp 98 F (36.7 C) (Oral)   Resp 19   Ht 5' 9 (1.753 m)   Wt 70.7 kg   SpO2 94%   BMI 23.02 kg/m   Adult male, thin, lying in bed RRR, no murmurs, no peripheral edema, JVP still looks elevated above the clavicle Respiratory effort appears normal, lung sounds diminished overall, but no rales or wheezes appreciated Abdomen soft, no tenderness palpation Oriented to person, place, time, generalized weakness but symmetric strength  Data Reviewed: Basic metabolic panel shows creatinine up to 2.9, electrolytes normal CBC shows improved leukocytosis, hemoglobin trending slightly down     Family Communication: Daughter by phone    Disposition: Status is: Inpatient         Author: Lonni SHAUNNA Dalton, MD 09/09/2023 2:24 PM  For on call review [email or web address].

## 2023-09-09 NOTE — Progress Notes (Signed)
 Mobility Specialist: Progress Note   09/09/23 1500  Mobility  Activity Ambulated with assistance  Level of Assistance Standby assist, set-up cues, supervision of patient - no hands on  Assistive Device Front wheel walker  Distance Ambulated (ft) 400 ft  Activity Response Tolerated well  Mobility Referral Yes  Mobility visit 1 Mobility  Mobility Specialist Start Time (ACUTE ONLY) 1110  Mobility Specialist Stop Time (ACUTE ONLY) 1124  Mobility Specialist Time Calculation (min) (ACUTE ONLY) 14 min    Pt received in bed, very pleasant and eager for OOB mobility. SV throughout. SpO2 85-91% on 6LO2 throughout ambulation. C/o SOB and fatigue towards EOS. Returned to room without fault. Left on EOB with all needs met, call bell in reach.   Ileana Lute Mobility Specialist Please contact via SecureChat or Rehab office at [phone]

## 2023-09-09 NOTE — Plan of Care (Signed)
  Problem: Education: Goal: Knowledge of General Education information will improve Description: Including pain rating scale, medication(s)/side effects and non-pharmacologic comfort measures Outcome: Progressing   Problem: Health Behavior/Discharge Planning: Goal: Ability to manage health-related needs will improve Outcome: Progressing   Problem: Nutrition: Goal: Adequate nutrition will be maintained Outcome: Progressing   Problem: Elimination: Goal: Will not experience complications related to bowel motility Outcome: Progressing Goal: Will not experience complications related to urinary retention Outcome: Progressing   Problem: Pain Managment: Goal: General experience of comfort will improve and/or be controlled Outcome: Progressing   Problem: Skin Integrity: Goal: Risk for impaired skin integrity will decrease Outcome: Progressing   Problem: Education: Goal: Ability to verbalize understanding of medication therapies will improve Outcome: Not Progressing   Problem: Activity: Goal: Capacity to carry out activities will improve Outcome: Progressing   Problem: Cardiac: Goal: Ability to achieve and maintain adequate cardiopulmonary perfusion will improve Outcome: Progressing   Problem: Activity: Goal: Ability to tolerate increased activity will improve Outcome: Progressing   Problem: Clinical Measurements: Goal: Ability to maintain a body temperature in the normal range will improve Outcome: Progressing   Problem: Respiratory: Goal: Ability to maintain adequate ventilation will improve Outcome: Progressing Goal: Ability to maintain a clear airway will improve Outcome: Progressing

## 2023-09-10 ENCOUNTER — Inpatient Hospital Stay (HOSPITAL_COMMUNITY)

## 2023-09-10 DIAGNOSIS — I509 Heart failure, unspecified: Secondary | ICD-10-CM | POA: Diagnosis not present

## 2023-09-10 LAB — BASIC METABOLIC PANEL WITH GFR
Anion gap: 12 (ref 5–15)
BUN: 56 mg/dL — ABNORMAL HIGH (ref 8–23)
CO2: 27 mmol/L (ref 22–32)
Calcium: 8.5 mg/dL — ABNORMAL LOW (ref 8.9–10.3)
Chloride: 100 mmol/L (ref 98–111)
Creatinine, Ser: 3.11 mg/dL — ABNORMAL HIGH (ref 0.61–1.24)
GFR, Estimated: 19 mL/min — ABNORMAL LOW (ref >60)
Glucose, Bld: 112 mg/dL — ABNORMAL HIGH (ref 70–99)
Potassium: 3.8 mmol/L (ref 3.5–5.1)
Sodium: 139 mmol/L (ref 135–145)

## 2023-09-10 MED ORDER — MUPIROCIN 2 % EX OINT
1.0000 | TOPICAL_OINTMENT | Freq: Two times a day (BID) | CUTANEOUS | Status: DC
  Administered 2023-09-10 – 2023-09-14 (×8): 1 via NASAL
  Filled 2023-09-10 (×3): qty 22

## 2023-09-10 MED ORDER — ASPIRIN 81 MG PO TBEC
81.0000 mg | DELAYED_RELEASE_TABLET | Freq: Every evening | ORAL | Status: DC
Start: 2023-09-12 — End: 2023-09-12

## 2023-09-10 MED ORDER — DOXYCYCLINE HYCLATE 100 MG PO TABS
100.0000 mg | ORAL_TABLET | Freq: Two times a day (BID) | ORAL | Status: DC
  Administered 2023-09-10 – 2023-09-14 (×8): 100 mg via ORAL
  Filled 2023-09-10 (×8): qty 1

## 2023-09-10 MED ORDER — ISOSORBIDE MONONITRATE ER 30 MG PO TB24
30.0000 mg | ORAL_TABLET | Freq: Every day | ORAL | Status: DC
  Administered 2023-09-11 – 2023-09-12 (×2): 30 mg via ORAL
  Filled 2023-09-10 (×2): qty 1

## 2023-09-10 NOTE — Progress Notes (Addendum)
  Progress Note   Patient: [name] FMW:[id number] DOB: [birth year] DOA: 09/07/2023     2 DOS: the patient was seen and examined on 09/10/2023 around mid-day      Brief hospital course: 83 y.o. M with CAD s/p CABG, COPD not on home O2, HTN, RCC s/p nephrectomy, CKD IV baseline 2.8 who presented with SOB, found to have CHF flare and pneumonia.     Assessment and Plan: Sepsis due to community-acquired pneumonia Respiratory failure with hypoxia, acute See note from 8/21 - Continue Rocephin  and doxycycline  - Continue incentive spirometer and flutter - Wean oxygen as able - Repeat chest x-ray today, 2 view, evaluation for pleural effusion  Acute congestive heart failure with mildly reduced ejection fraction Echocardiogram shows no significant change, creatinine up today - Hold IV furosemide   Coronary artery disease Demand ischemia, hypertension, hyperlipidemia -Continue aspirin , Plavix , Zetia , Nebivolol , Crestor  - Reduce Imdur  dose to avoid hypotension - Hold amlodipine   Lung nodule - Repeat CT chest in 4 weeks - Pulmonology follow-up with discharge  Chronic kidney disease stage IV Creatinine slightly up to 3.1 today - Daily BMP  Chronic pain syndrome - Continue home Percocet  COPD No wheezing to suggest flare - Continue Breztri  - Chest physiotherapy  Mood disorder - Continue venlafaxine   Diabetes Diet controlled  Anemia of chronic kidney disease        Subjective: Patient is feeling better and better.  He said no fever overnight.  He had no confusion.  He said no respiratory distress.  He is not bringing up sputum or hemoptysis.  Still on 5 L.     Physical Exam: BP (!) 114/55 (BP Location: Right Arm)   Pulse 80   Temp 98.3 F (36.8 C) (Oral)   Resp 20   Ht 5' 9 (1.753 m)   Wt 70.5 kg   SpO2 94%   BMI 22.95 kg/m   Adult male, sitting up in bed, eating lunch RRR, no murmurs, no peripheral edema, JVP not visible sitting straight  up Respiratory rate seems normal, lung sounds very diminished in the bases, no rales or wheezes appreciated Abdomen soft, no tenderness palpation, no ascites or distention Generalized weakness but symmetric, oriented to person, place, and time, memory seems impaired     Data Reviewed: Basic metabolic panel shows creatinine up to 3.1    Family Communication:     Disposition: Status is: Inpatient         Author: Lonni SHAUNNA Dalton, MD 09/10/2023 2:38 PM  For on call review [email or web address].

## 2023-09-10 NOTE — Evaluation (Signed)
 Physical Therapy Evaluation Patient Details Name: [name] MRN: [id number] DOB: 03-09-1940 Today's Date: 09/10/2023  History of Present Illness  83 y.o. M who presented 8/19 with SOB, found to have CHF flare and pneumonia. PMH:CAD s/p CABG, COPD not on home O2, HTN, RCC s/p nephrectomy, CKD IV.  Clinical Impression  Pt admitted with above diagnosis. Previously independent but limited by DOE. Has 1 step to enter home with a single rail. Can't stay on the main floor with full bath and bedroom. Daughter lives nearby to check on him. Able to ambulate 250 feet today with SpO2 94%and greater on 4L HFNC. RW utilized to help with balance and efficiency of gait. Pt currently with functional limitations due to the deficits listed below (see PT Problem List). Pt will benefit from acute skilled PT to increase their independence and safety with mobility to allow discharge.           If plan is discharge home, recommend the following: Assistance with cooking/housework;Assist for transportation;Help with stairs or ramp for entrance   Can travel by private vehicle        Equipment Recommendations Rollator (4 wheels)  Recommendations for Other Services       Functional Status Assessment Patient has had a recent decline in their functional status and demonstrates the ability to make significant improvements in function in a reasonable and predictable amount of time.     Precautions / Restrictions Precautions Precautions: Fall Recall of Precautions/Restrictions: Intact Precaution/Restrictions Comments: Monitor O2 Restrictions Weight Bearing Restrictions Per Provider Order: No      Mobility  Bed Mobility Overal bed mobility: Modified Independent             General bed mobility comments: Extra time no assist.    Transfers Overall transfer level: Needs assistance Equipment used: Rolling walker (2 wheels) Transfers: Sit to/from Stand Sit to Stand: Supervision            General transfer comment: Supervision for safety no physical assist required. Light UE support from RW.    Ambulation/Gait Ambulation/Gait assistance: Supervision Gait Distance (Feet): 250 Feet Assistive device: Rolling walker (2 wheels) Gait Pattern/deviations: Step-through pattern, Decreased stride length, Trunk flexed Gait velocity: dec Gait velocity interpretation: <1.8 ft/sec, indicate of risk for recurrent falls   General Gait Details: Educated on safe AD use with RW. Cues for upright posture. No overt LOB or buckling noted. Pt on 4L supplemental O2 HFNC with SpO2 94% and greater throughout distance. No dyspnea.  Stairs            Wheelchair Mobility     Tilt Bed    Modified Rankin (Stroke Patients Only)       Balance Overall balance assessment: Needs assistance Sitting-balance support: No upper extremity supported, Feet supported Sitting balance-Leahy Scale: Normal     Standing balance support: No upper extremity supported Standing balance-Leahy Scale: Fair                               Pertinent Vitals/Pain Pain Assessment Pain Assessment: No/denies pain    Home Living Family/patient expects to be discharged to:: Private residence Living Arrangements: Alone Available Help at Discharge: Family;Available PRN/intermittently Type of Home: House Home Access: Stairs to enter Entrance Stairs-Rails: Right Entrance Stairs-Number of Steps: 1   Home Layout: Multi-level;Able to live on main level with bedroom/bathroom Home Equipment: Shower seat - built Char[employer] fundraiser (2 wheels);Cane - single point;Wheelchair - manual;BSC/3in1  Prior Function Prior Level of Function : Independent/Modified Independent             Mobility Comments: Not using an AD, states mobility limited by SOB but pretty active. ADLs Comments: Ind but limited by SOB with exertion. Able to pick his tomatoes and mow his yard on a riding mower.     Extremity/Trunk  Assessment   Upper Extremity Assessment Upper Extremity Assessment: Defer to OT evaluation    Lower Extremity Assessment Lower Extremity Assessment: Generalized weakness       Communication   Communication Communication: No apparent difficulties    Cognition Arousal: Alert Behavior During Therapy: WFL for tasks assessed/performed   PT - Cognitive impairments: No apparent impairments                         Following commands: Intact       Cueing Cueing Techniques: Verbal cues     General Comments General comments (skin integrity, edema, etc.): Pre activity: SpO2 98% 5L HFNC, HR 80. BP 116/68. Post activity: SpO2 100% on 4L HFNC.    Exercises     Assessment/Plan    PT Assessment Patient needs continued PT services  PT Problem List Decreased strength;Decreased activity tolerance;Decreased mobility;Decreased balance;Decreased knowledge of use of DME;Cardiopulmonary status limiting activity       PT Treatment Interventions DME instruction;Gait training;Stair training;Functional mobility training;Therapeutic activities;Therapeutic exercise;Balance training;Neuromuscular re-education;Patient/family education    PT Goals (Current goals can be found in the Care Plan section)  Acute Rehab PT Goals Patient Stated Goal: Go home PT Goal Formulation: With patient Time For Goal Achievement: 09/24/23 Potential to Achieve Goals: Good    Frequency Min 2X/week     Co-evaluation               AM-PAC PT 6 Clicks Mobility  Outcome Measure Help needed turning from your back to your side while in a flat bed without using bedrails?: None Help needed moving from lying on your back to sitting on the side of a flat bed without using bedrails?: None Help needed moving to and from a bed to a chair (including a wheelchair)?: A Little Help needed standing up from a chair using your arms (e.g., wheelchair or bedside chair)?: A Little Help needed to walk in hospital  room?: A Little Help needed climbing 3-5 steps with a railing? : A Little 6 Click Score: 20    End of Session Equipment Utilized During Treatment: Oxygen Activity Tolerance: Patient tolerated treatment well Patient left: in chair;with call bell/phone within reach   PT Visit Diagnosis: Unsteadiness on feet (R26.81);Other abnormalities of gait and mobility (R26.89);Muscle weakness (generalized) (M62.81);Difficulty in walking, not elsewhere classified (R26.2)    Time: 9146-9074 PT Time Calculation (min) (ACUTE ONLY): 32 min   Charges:   PT Evaluation $PT Eval Low Complexity: 1 Low PT Treatments $Gait Training: 8-22 mins PT General Charges $$ ACUTE PT VISIT: 1 Visit         .lsb   Leontine GORMAN Roads 09/10/2023, 10:15 AM

## 2023-09-10 NOTE — Progress Notes (Signed)
 Mobility Specialist: Progress Note   09/10/23 1500  Mobility  Activity Ambulated with assistance  Level of Assistance Standby assist, set-up cues, supervision of patient - no hands on  Assistive Device Front wheel walker  Distance Ambulated (ft) 400 ft  Activity Response Tolerated well  Mobility Referral Yes  Mobility visit 1 Mobility  Mobility Specialist Start Time (ACUTE ONLY) 1220  Mobility Specialist Stop Time (ACUTE ONLY) 1236  Mobility Specialist Time Calculation (min) (ACUTE ONLY) 16 min    Pt received in bed, pleasant and agreeable to mobility session. SV throughout. SpO2 >90% on 4LO2. Feeling fatigued and SOB by EOS but otherwise no complaints. Left on EOB on 3.5L with all needs met, call bell in reach.   Ileana Lute Mobility Specialist Please contact via SecureChat or Rehab office at [phone]

## 2023-09-10 NOTE — Plan of Care (Signed)
  Problem: Education: Goal: Knowledge of General Education information will improve Description: Including pain rating scale, medication(s)/side effects and non-pharmacologic comfort measures Outcome: Progressing   Problem: Health Behavior/Discharge Planning: Goal: Ability to manage health-related needs will improve Outcome: Progressing   Problem: Clinical Measurements: Goal: Respiratory complications will improve Outcome: Progressing   Problem: Activity: Goal: Risk for activity intolerance will decrease Outcome: Progressing   Problem: Nutrition: Goal: Adequate nutrition will be maintained Outcome: Progressing   Problem: Coping: Goal: Level of anxiety will decrease Outcome: Progressing   Problem: Pain Managment: Goal: General experience of comfort will improve and/or be controlled Outcome: Progressing   Problem: Safety: Goal: Ability to remain free from injury will improve Outcome: Progressing   Problem: Education: Goal: Ability to demonstrate management of disease process will improve Outcome: Progressing

## 2023-09-11 ENCOUNTER — Other Ambulatory Visit: Payer: Self-pay

## 2023-09-11 DIAGNOSIS — I509 Heart failure, unspecified: Secondary | ICD-10-CM | POA: Diagnosis not present

## 2023-09-11 LAB — BASIC METABOLIC PANEL WITH GFR
Anion gap: 11 (ref 5–15)
BUN: 59 mg/dL — ABNORMAL HIGH (ref 8–23)
CO2: 26 mmol/L (ref 22–32)
Calcium: 8.2 mg/dL — ABNORMAL LOW (ref 8.9–10.3)
Chloride: 100 mmol/L (ref 98–111)
Creatinine, Ser: 2.93 mg/dL — ABNORMAL HIGH (ref 0.61–1.24)
GFR, Estimated: 21 mL/min — ABNORMAL LOW (ref >60)
Glucose, Bld: 108 mg/dL — ABNORMAL HIGH (ref 70–99)
Potassium: 3.8 mmol/L (ref 3.5–5.1)
Sodium: 137 mmol/L (ref 135–145)

## 2023-09-11 LAB — CBC
HCT: 26.8 % — ABNORMAL LOW (ref 39.0–52.0)
Hemoglobin: 8.8 g/dL — ABNORMAL LOW (ref 13.0–17.0)
MCH: 28.7 pg (ref 26.0–34.0)
MCHC: 32.8 g/dL (ref 30.0–36.0)
MCV: 87.3 fL (ref 80.0–100.0)
Platelets: 188 K/uL (ref 150–400)
RBC: 3.07 MIL/uL — ABNORMAL LOW (ref 4.22–5.81)
RDW: 14.5 % (ref 11.5–15.5)
WBC: 9.7 K/uL (ref 4.0–10.5)
nRBC: 0 % (ref 0.0–0.2)

## 2023-09-11 MED ORDER — APIXABAN 2.5 MG PO TABS
2.5000 mg | ORAL_TABLET | Freq: Two times a day (BID) | ORAL | Status: DC
  Administered 2023-09-11 – 2023-09-14 (×6): 2.5 mg via ORAL
  Filled 2023-09-11 (×6): qty 1

## 2023-09-11 MED ORDER — FUROSEMIDE 10 MG/ML IJ SOLN
40.0000 mg | Freq: Once | INTRAMUSCULAR | Status: AC
  Administered 2023-09-11: 40 mg via INTRAVENOUS
  Filled 2023-09-11: qty 4

## 2023-09-11 NOTE — Progress Notes (Signed)
 Progress Note   Patient: [name] FMW:[id number] DOB: 12/26/1940 DOA: 09/07/2023     3 DOS: the patient was seen and examined on 09/11/2023 at 11:15AM      Brief hospital course: 83 y.o. M with CAD s/p CABG, COPD not on home O2, HTN, RCC s/p nephrectomy, CKD IV baseline 2.8 who presented with SOB, found to have CHF flare and pneumonia and new onset Aflutter as well as abnormal chest CT.     Assessment and Plan: Sepsis due to community-acquired pneumonia Acute respiratory failure with hypoxia Presented with fever, tachypnea, tachycardia, leukocytosis, respiratory failure due to multifocal pneumonia.  White count and fever resolved.  Still with rales today, still with hazy opacities on CXR yesterday afternoon. - Continue Rocephin  and doxycycline  - Continue incentive spirometer and flutter - Wean oxygen as able   Acute congestive heart failure with mildly reduced ejection fraction Echocardiogram shows EF 50 to 55% (previously 40 to 45%).  Creatinine slightly better today - Repeat IV furosemide  - Strict ins and outs - Monitor potassium - Continue nebivolol  - ARB/ARNI avoided given renal function   New onset atrial flutter Patient noted to be in atrial flutter this hospitalization, rate stable, 4:1 conduction. Troponin low, no ST chagnes, no chest pain, echo without RWMA or valvular disease.  TSH normal 2 months ago. - Continue nebivolol  - Start Eliquis    Coronary artery disease Status post CABG 2001 Last left heart cath 2020 with severe multivessel disease, no PCI, recommended indefinite aspirin  and clopidogrel  at that time. -Continue aspirin  - Stop Plavix  given new start Eliquis  - Continue Nebivolol  -Continue reduced dose Imdur  to avoid hypotension - Continue Zetia , Crestor  - Hold amlodipine   Abnormal chest CT CT chest on admission showed pulmonary edema with right pleural effusion and loculated fluid in the left major fissure with some consolidative opacities  throughout the lungs and mediastinal lymphadenopathy.  This was discussed with pulmonology at the time of admission who recommended against chest tube or thoracentesis, but follow-up with pulmonology in the lung nodule clinic  Chronic kidney disease stage IV Cr improved back to 2.9, slightly above baseline - Daily BMP  Chronic pain syndrome - Continue home Percocet  COPD No wheezing to suggest flare - Continue Breztri  - Chest PT  Mood disorder - Continue venlafaxine   Diabetes Diet controlled  Anemia of chronic kidney disease           Subjective: Patient is feeling better but still on 2-3L and still OOB with just walking to the bathroom.  No fever, not bring up much sputum. Still in flutter on monitor     Physical Exam: BP 128/69 (BP Location: Right Arm)   Pulse 81   Temp 98.1 F (36.7 C) (Oral)   Resp 17   Ht 5' 9 (1.753 m)   Wt 70.3 kg   SpO2 92%   BMI 22.89 kg/m   Adult male, sitting up in bed on the edge of the bed, eating lunch, appears tired RRR, no murmurs, no peripheral edema, JVP looks normal Respiratory effort seems normal at rest but out of breath with exertion, crackles bilaterally, diminished at bases, no wheezing at all Abdomen soft, no tenderness palpation Attention normal, generalized weakness, oriented to person, place, and time, symmetric strength, speech fluent    Data Reviewed: Basic metabolic panel shows creatinine back down to 2.9 CBC shows hemoglobin down to 8.8 Chest x-ray shows bilateral small effusions, bilateral opacities EKG, personally reviewed, shows atrial flutter    Family Communication: Daughter  at the bedside    Disposition: Status is: Inpatient         Author: Lonni SHAUNNA Dalton, MD 09/11/2023 3:17 PM  For on call review [email or web address].

## 2023-09-11 NOTE — Plan of Care (Signed)

## 2023-09-12 ENCOUNTER — Inpatient Hospital Stay (HOSPITAL_COMMUNITY)

## 2023-09-12 ENCOUNTER — Encounter (HOSPITAL_COMMUNITY): Payer: Self-pay | Admitting: Internal Medicine

## 2023-09-12 DIAGNOSIS — I509 Heart failure, unspecified: Secondary | ICD-10-CM | POA: Diagnosis not present

## 2023-09-12 DIAGNOSIS — I5031 Acute diastolic (congestive) heart failure: Secondary | ICD-10-CM

## 2023-09-12 LAB — BASIC METABOLIC PANEL WITH GFR
Anion gap: 12 (ref 5–15)
BUN: 62 mg/dL — ABNORMAL HIGH (ref 8–23)
CO2: 27 mmol/L (ref 22–32)
Calcium: 8.6 mg/dL — ABNORMAL LOW (ref 8.9–10.3)
Chloride: 100 mmol/L (ref 98–111)
Creatinine, Ser: 3.11 mg/dL — ABNORMAL HIGH (ref 0.61–1.24)
GFR, Estimated: 19 mL/min — ABNORMAL LOW (ref >60)
Glucose, Bld: 114 mg/dL — ABNORMAL HIGH (ref 70–99)
Potassium: 4 mmol/L (ref 3.5–5.1)
Sodium: 139 mmol/L (ref 135–145)

## 2023-09-12 LAB — CBC
HCT: 29.6 % — ABNORMAL LOW (ref 39.0–52.0)
Hemoglobin: 9.5 g/dL — ABNORMAL LOW (ref 13.0–17.0)
MCH: 28.6 pg (ref 26.0–34.0)
MCHC: 32.1 g/dL (ref 30.0–36.0)
MCV: 89.2 fL (ref 80.0–100.0)
Platelets: 191 K/uL (ref 150–400)
RBC: 3.32 MIL/uL — ABNORMAL LOW (ref 4.22–5.81)
RDW: 14.3 % (ref 11.5–15.5)
WBC: 7.7 K/uL (ref 4.0–10.5)
nRBC: 0 % (ref 0.0–0.2)

## 2023-09-12 MED ORDER — FUROSEMIDE 10 MG/ML IJ SOLN
40.0000 mg | Freq: Once | INTRAMUSCULAR | Status: AC
  Administered 2023-09-12: 40 mg via INTRAVENOUS
  Filled 2023-09-12: qty 4

## 2023-09-12 MED ORDER — NEBIVOLOL HCL 5 MG PO TABS
5.0000 mg | ORAL_TABLET | Freq: Every day | ORAL | Status: DC
  Administered 2023-09-13 – 2023-09-14 (×2): 5 mg via ORAL
  Filled 2023-09-12 (×2): qty 1

## 2023-09-12 MED ORDER — CLOPIDOGREL BISULFATE 75 MG PO TABS
75.0000 mg | ORAL_TABLET | Freq: Every day | ORAL | Status: DC
  Administered 2023-09-13 – 2023-09-14 (×2): 75 mg via ORAL
  Filled 2023-09-12 (×2): qty 1

## 2023-09-12 NOTE — Plan of Care (Signed)

## 2023-09-12 NOTE — Progress Notes (Signed)
 Mobility Specialist Progress Note:   09/12/23 0921  Mobility  Activity Ambulated with assistance  Level of Assistance Standby assist, set-up cues, supervision of patient - no hands on  Assistive Device Front wheel walker  Distance Ambulated (ft) 400 ft  Activity Response Tolerated well  Mobility Referral Yes  Mobility visit 1 Mobility  Mobility Specialist Start Time (ACUTE ONLY) [id number]  Mobility Specialist Stop Time (ACUTE ONLY) 0933  Mobility Specialist Time Calculation (min) (ACUTE ONLY) 12 min   Pt agreeable to mobility session. Required only supervision to ambulate with RW. VSS on 2LO2 throughout. Pt back in bed with all needs met.   Therisa Rana Mobility Specialist Please contact via SecureChat or  Rehab office at [phone]

## 2023-09-12 NOTE — Progress Notes (Signed)
 Progress Note   Patient: [name] FMW:[id number] DOB: [birth year] DOA: 09/07/2023     4 DOS: the patient was seen and examined on 09/12/2023 at 9:48AM      Brief hospital course: 83 y.o. M with CAD s/p CABG, COPD not on home O2, HTN, RCC s/p nephrectomy, CKD IV baseline 2.8 who presented with SOB, fever 103F, found to have pneumonia, CHF flare, and new onset Aflutter as well as abnormal chest CT.     Assessment and Plan: Community-acquired pneumonia sepsis Acute respiratory failure with hypoxia - Continue Rocephin  and doxycycline  day 5 of 7 -Pulmonary toilet   Acute congestive heart failure with mildly reduced ejection fraction Previous EF 40 to 45%, recently recovered.  Still has some crackles on exam, chest x-ray 2 days ago showed what appeared to be edema - Consult cardiology - Defer further diuretics to cardiology - Continue Nebivolol  - ARB/ARNI avoided given renal function  New onset atrial flutter This was new this admission.  Rate has remained stable 4 1 conduction.  TSH normal 2 months ago.  Echo without valvular disease or regional wall motion abnormalities.  Cardiology astute that his longstanding issues with esophageal stenosis complicate TEE DCCV.  Also agree, diuresis challenging due to prior nephrectomy, baseline Cr 2.5. - Consult cardiology regarding timing of DCCV - Continue Nebivolol , Eliquis   Coronary artery disease S/p CABG 2001 Consult cardiology for antiplatelet recommendations - Continue Eliquis  - Cardiology recommend stopping aspirin , continuing Plavix  - Continue Nebivolol , reduced dose Imdur , Zetia , Crestor  - Hold amlodipine   Abnormal chest CT He has some atypical consolidations, loculated effusion, malignancy not completely ruled out.  Will need outpatient pulmonology follow-up.  This was discussed with pulmonology on admission.  Chronic kidney disease stage IV Creatinine back up to 3.1 today with a dose of Lasix  yesterday -Consult  nephrology for optimization in case right heart cath needed  Chronic pain syndrome -Continue home Percocet  COPD No wheezing here to suggest flare - Continue Breztri   Mood disorder -Continue venlafaxine   Diabetes Diet controlled, glucose normal here  Anemia of chronic kidney disease No clinical bleeding reported or observed, overall hemoglobin has remained stable         Subjective: Patient overall is feeling okay.  He has no chest pain, confusion.  He has memory loss it appears, but this seems close to his baseline.  He has shortness of breath which is unchanged for over the last 48 hours.  No swelling.  Cardiology and nephrology consulted today.     Physical Exam: BP 124/67 (BP Location: Right Arm)   Pulse 76   Temp 97.8 F (36.6 C) (Oral)   Resp 18   Ht 5' 9 (1.753 m)   Wt 68.4 kg   SpO2 98%   BMI 22.27 kg/m   Thin adult male, lying in bed, pleasant and interactive but weak RRR, I do not appreciate systolic murmurs, neck veins elevated above the clavicle, essentially unchanged Respiratory rate seems normal, at rest, lung sounds diminished at bases, some crackles still present but improved from yesterday Abdomen soft, no tenderness palpation, no ascites or distention Attention normal, short-term memory appears impaired but is at baseline, he has symmetric strength 5/5 in upper and lower extremities bilaterally    Data Reviewed: Discussed with cardiology Basic metabolic panel shows creatinine up to 3.1 CBC shows hemoglobin stable at 9.5, white count resolved     Family Communication: Daughter by phone    Disposition: Status is: Inpatient  Author: Lonni SHAUNNA Dalton, MD 09/12/2023 11:09 AM  For on call review [email or web address].

## 2023-09-12 NOTE — Consult Note (Addendum)
 Cardiology Consultation   Patient ID: [name] MRN: [id number]; DOB: 29-Jun-1940  Admit date: 09/07/2023 Date of Consult: 09/12/2023  PCP:  Theophilus Andrews, Tully GRADE, MD   [facility] Providers Cardiologist:  Debby Sor, MD (Inactive) seen by Dr. DOROTHA Ross this admission, will need to establish with a cardiologist in [place]     Patient Profile: [name] is a 83 y.o. male with a hx of CAD s/p CABG x 4 in 2001, DES x 3 overlapping in 2020, hypertension hyperlipidemia, OSA not on CPAP, DM2, bladder cancer, right renal cell carcinoma, COPD, and GERD who is being seen 09/12/2023 for the evaluation of CHF at the request of Dr. Jonel.  History of Present Illness: [name] underwent CABG x 4 in 2001.  He has a history of renal cell carcinoma and is s/p nephrectomy.  He has baseline CKD 4 with creatinine near 2.6.  LHC 12/2018 with severe native CAD including 75% ostial left main stenosis.  He had a widely patent LIMA-LAD, chronically occluded SVG-OM1, subtotally occluded SVG-RPDA-RPL with 99% mid Stenosis following by extensive thrombus.  This was treated with successful PCI to SVG-RPDA-RPL using overlapping DES 3.0 x 18 mm, 3.0 x 38 mm, 3.0 x 38 mm.  At that time he was recommended for indefinite DAPT with aspirin  and Plavix .  Echo during that admission showed an LVEF 40-45%, severe hypokinesis of the entire inferior wall and inferoseptal wall, elevated LVEDP, grade 1 DD, moderate LAE, mild MR, could not exclude ASD/PFO.  Follow-up echocardiogram 03/2019 following revascularization and GDMT showed improved LVEF to 45%, grade 2 DD, moderate LAE.  Lasix  anticoagulation with nuclear stress test/25/24 showed fixed inferior wall defect but no reversible ischemia.  He was admitted 06/2023 with AKI on CKD 4 in the setting of sepsis and hyperkalemia.  He was treated for RML pneumonia.   He has had ongoing abdominal pain with a 40 pound weight loss last year.  He was recently  evaluated by [facility] for mesenteric artery stenosis and underwent abdominal aortogram 08/03/2023 resulting in 30-40% ostial superior mesenteric artery stenosis.  This was treated medically and felt not to explain his symptoms.  He also underwent barium esophagram 07/2023 that showed smooth narrowing stricture at distal esophagus and was planned for EGD with dilation this month, which was canceled due to his current admission.    He presented to [facility] ED 09/07/23 with orthopnea, dyspnea, and PND that was progressively worsening.  He reported taking SL NTG prior to arrival.  This transiently improved his shortness of breath but symptoms returned.  He was febrile, tachypneic, tachycardic, and had a leukocytosis with a normal lactic acid.  Chest imaging felt consistent with both pulmonary vascular congestion and pneumonia.  BNP was 13,000.  Code sepsis was initiated and he received 1 L of LR followed by 40 mg Lasix  p.o.  He has been treated with antibiotic but developed atrial flutter 09/11/2023.  Echocardiogram 09/08/2023 showed a normal normal LVEF of 50-55%, normal RV function, moderate LAE, trivial MR.   Cardiology consulted for management of fluid status and atrial flutter.   He has undergone diuresis but sCr has increased to 3.11 from a baseline of 2.6.   EKG with atrial flutter 4:1, VR 77.  He was started on 2.5 mg eliquis  BID, appropriately dosed. He remains on 81 mg ASA.   During my exam, he is lying flat. He reports about 1 day of fever and chills with tem 103 and shortness of breath prior  to presenting to [facility]. He is feeling improved from a breathing standpoint, but remains no O2 and he states he is not back to baseline. He denies chest pain. He called EMS due to severe dyspnea.   He is generally unaware of his rhythm. He occasionally feels fluttering but this has been occurring for several months.   Past Medical History:  Diagnosis Date   Allergy     Anxiety    B12 deficiency anemia    Blood  transfusion without reported diagnosis    CAD (coronary artery disease)    Cancer (HCC)    bladder, Right renal cell also   Chronic kidney disease    Colon polyps    COPD (chronic obstructive pulmonary disease) (HCC)    Depression    Diabetes mellitus (HCC)    Dyspnea    Esophagus, Barrett's    GERD (gastroesophageal reflux disease)    Headache    History of bladder cancer    Bladder cancer 8 times   History of hiatal hernia    Hyperlipidemia    Hypertension    Localized osteoarthrosis, lower leg    Myocardial infarction (HCC) 2021   Pneumonia    Restless leg syndrome    Sleep apnea    does not wear cpap   Stenosis of esophagus     Past Surgical History:  Procedure Laterality Date   ABDOMINAL AORTOGRAM N/A 08/03/2023   Procedure: ABDOMINAL AORTOGRAM;  Surgeon: Serene Gaile ORN, MD;  [facility];  Service: Cardiovascular;  Laterality: N/A;   BILIARY BRUSHING  04/01/2018   Procedure: BILIARY BRUSHING;  Surgeon: Wilhelmenia Aloha Raddle., MD;  Location: [facility] ENDOSCOPY;  Service: Gastroenterology;;   BILIARY BRUSHING  09/12/2018   Procedure: BILIARY BRUSHING;  Surgeon: Wilhelmenia Aloha Raddle., MD;  Location: [facility] ENDOSCOPY;  Service: Gastroenterology;;   BILIARY BRUSHING  11/28/2018   Procedure: BILIARY BRUSHING;  Surgeon: Wilhelmenia Aloha Raddle., MD;  Location: [facility] ENDOSCOPY;  Service: Gastroenterology;;   BILIARY BRUSHING  03/11/2020   Procedure: BILIARY BRUSHING;  Surgeon: Wilhelmenia Aloha Raddle., MD;  Location: [facility] ENDOSCOPY;  Service: Gastroenterology;;   BILIARY DILATION  09/12/2018   Procedure: BILIARY DILATION;  Surgeon: Wilhelmenia Aloha Raddle., MD;  Location: [facility] ENDOSCOPY;  Service: Gastroenterology;;   BILIARY DILATION  11/28/2018   Procedure: BILIARY DILATION;  Surgeon: Wilhelmenia Aloha Raddle., MD;  Location: [facility] ENDOSCOPY;  Service: Gastroenterology;;   BILIARY DILATION  03/11/2020   Procedure: BILIARY DILATION;  Surgeon: Wilhelmenia Aloha Raddle., MD;   Location: [facility]OSCOPY;  Service: Gastroenterology;;   BILIARY STENT PLACEMENT  04/01/2018   Procedure: BILIARY STENT PLACEMENT;  Surgeon: Wilhelmenia Aloha Raddle., MD;  Location: [facility] ENDOSCOPY;  Service: Gastroenterology;;   BILIARY STENT PLACEMENT  09/12/2018   Procedure: BILIARY STENT PLACEMENT;  Surgeon: Wilhelmenia Aloha Raddle., MD;  Location: [facility] ENDOSCOPY;  Service: Gastroenterology;;   BILIARY STENT PLACEMENT  11/28/2018   Procedure: BILIARY STENT PLACEMENT;  Surgeon: Wilhelmenia Aloha Raddle., MD;  Location: [facility] ENDOSCOPY;  Service: Gastroenterology;;   BIOPSY  04/01/2018   Procedure: BIOPSY;  Surgeon: Wilhelmenia Aloha Raddle., MD;  Location: [facility] ENDOSCOPY;  Service: Gastroenterology;;   BIOPSY  09/12/2018   Procedure: BIOPSY;  Surgeon: Wilhelmenia Aloha Raddle., MD;  Location: [facility] ENDOSCOPY;  Service: Gastroenterology;;   BIOPSY  11/28/2018   Procedure: BIOPSY;  Surgeon: Wilhelmenia Aloha Raddle., MD;  Location: [facility] ENDOSCOPY;  Service: Gastroenterology;;   BIOPSY  03/11/2020   Procedure: BIOPSY;  Surgeon: Wilhelmenia Aloha Raddle., MD;  Location: [facility] ENDOSCOPY;  Service: Gastroenterology;;  bladder cancer      x 8 cystoscopy   CERVICAL DISCECTOMY     ACDF   CHOLECYSTECTOMY     COLONOSCOPY  11/17/2005   normal    CORONARY ARTERY BYPASS GRAFT     x4   CORONARY STENT INTERVENTION N/A 01/05/2019   Procedure: CORONARY STENT INTERVENTION;  Surgeon: Mady Bruckner, MD;  [facility];  Service: Cardiovascular;  Laterality: N/A;   CYSTOSCOPY WITH BIOPSY N/A 03/27/2022   Procedure: CYSTOSCOPY WITH BIOPSY;  Surgeon: Watt Rush, MD;  Location: [facility] ORS;  Service: Urology;  Laterality: N/A;   CYSTOSCOPY WITH RETROGRADE PYELOGRAM, URETEROSCOPY AND STENT PLACEMENT Right 03/27/2022   Procedure: CYSTOSCOPY WITH RIGHT RETROGRADE PYELOGRAM, URETEROSCOPY AND STENT PLACEMENT urethral dilation;  Surgeon: Watt Rush, MD;  Location: [facility] ORS;  Service: Urology;  Laterality: Right;   ENDOSCOPIC MUCOSAL  RESECTION  09/12/2018   Procedure: ENDOSCOPIC MUCOSAL RESECTION;  Surgeon: Wilhelmenia Aloha Raddle., MD;  Location: [facility] ENDOSCOPY;  Service: Gastroenterology;;   ENDOSCOPIC RETROGRADE CHOLANGIOPANCREATOGRAPHY (ERCP) WITH PROPOFOL  N/A 04/01/2018   Procedure: ENDOSCOPIC RETROGRADE CHOLANGIOPANCREATOGRAPHY (ERCP) WITH PROPOFOL ;  Surgeon: Wilhelmenia Aloha Raddle., MD;  Location: [facility] ENDOSCOPY;  Service: Gastroenterology;  Laterality: N/A;   ENDOSCOPIC RETROGRADE CHOLANGIOPANCREATOGRAPHY (ERCP) WITH PROPOFOL  N/A 09/12/2018   Procedure: ENDOSCOPIC RETROGRADE CHOLANGIOPANCREATOGRAPHY (ERCP) WITH PROPOFOL ;  Surgeon: Wilhelmenia Aloha Raddle., MD;  Location: [facility] ENDOSCOPY;  Service: Gastroenterology;  Laterality: N/A;   ENDOSCOPIC RETROGRADE CHOLANGIOPANCREATOGRAPHY (ERCP) WITH PROPOFOL  N/A 03/11/2020   Procedure: ENDOSCOPIC RETROGRADE CHOLANGIOPANCREATOGRAPHY (ERCP) WITH PROPOFOL ;  Surgeon: Wilhelmenia Aloha Raddle., MD;  Location[facility] ENDOSCOPY;  Service: Gastroenterology;  Laterality: N/A;   ENDOSCOPIC RETROGRADE CHOLANGIOPANCREATOGRAPHY (ERCP) WITH PROPOFOL  N/A 06/03/2020   Procedure: ENDOSCOPIC RETROGRADE CHOLANGIOPANCREATOGRAPHY (ERCP) WITH PROPOFOL ;  Surgeon: Wilhelmenia Aloha Raddle., MD;  Location: [facility] ENDOSCOPY;  Service: Gastroenterology;  Laterality: N/A;   ERCP N/A 11/28/2018   Procedure: ENDOSCOPIC RETROGRADE CHOLANGIOPANCREATOGRAPHY (ERCP) +EGD with spyglass;  Surgeon: Wilhelmenia Aloha Raddle., MD;  Location: [facility] ENDOSCOPY;  Service: Gastroenterology;  Laterality: N/A;   ESOPHAGOGASTRODUODENOSCOPY  04/29/2010   ESOPHAGOGASTRODUODENOSCOPY (EGD) WITH PROPOFOL  N/A 04/01/2018   Procedure: ESOPHAGOGASTRODUODENOSCOPY (EGD) WITH PROPOFOL ;  Surgeon: Wilhelmenia Aloha Raddle., MD;  Location: [facility] ENDOSCOPY;  Service: Gastroenterology;  Laterality: N/A;   ESOPHAGOGASTRODUODENOSCOPY (EGD) WITH PROPOFOL  N/A 09/12/2018   Procedure: ESOPHAGOGASTRODUODENOSCOPY (EGD) WITH PROPOFOL ;  Surgeon: Wilhelmenia Aloha Raddle., MD;   Location: [facility] ENDOSCOPY;  Service: Gastroenterology;  Laterality: N/A;   ESOPHAGOGASTRODUODENOSCOPY (EGD) WITH PROPOFOL  N/A 11/28/2018   Procedure: ESOPHAGOGASTRODUODENOSCOPY (EGD) WITH PROPOFOL ;  Surgeon: Wilhelmenia Aloha Raddle., MD;  Location: [facility] ENDOSCOPY;  Service: Gastroenterology;  Laterality: N/A;   ESOPHAGOGASTRODUODENOSCOPY (EGD) WITH PROPOFOL  N/A 03/11/2020   Procedure: ESOPHAGOGASTRODUODENOSCOPY (EGD) WITH PROPOFOL ;  Surgeon: Wilhelmenia Aloha Raddle., MD;  Location[facility] ENDOSCOPY;  Service: Gastroenterology;  Laterality: N/A;   ESOPHAGOGASTRODUODENOSCOPY (EGD) WITH PROPOFOL  N/A 06/03/2020   Procedure: ESOPHAGOGASTRODUODENOSCOPY (EGD) WITH PROPOFOL ;  Surgeon: Wilhelmenia Aloha Raddle., MD;  Location[facility] ENDOSCOPY;  Service: Gastroenterology;  Laterality: N/A;   EUS  04/01/2018   Procedure: FULL UPPER ENDOSCOPIC ULTRASOUND (EUS) RADIAL;  Surgeon: Wilhelmenia Aloha Raddle., MD;  Location: [facility] ENDOSCOPY;  Service: Gastroenterology;;   EUS N/A 09/12/2018   Procedure: UPPER ENDOSCOPIC ULTRASOUND (EUS) RADIAL;  Surgeon: Wilhelmenia Aloha Raddle., MD;  Location: [facility] ENDOSCOPY;  Service: Gastroenterology;  Laterality: N/A;   FINE NEEDLE ASPIRATION  09/12/2018   Procedure: FINE NEEDLE ASPIRATION (FNA) LINEAR;  Surgeon: Wilhelmenia Aloha Raddle., MD;  Location: [facility] ENDOSCOPY;  Service: Gastroenterology;;   HAND SURGERY Left 2018   saw accident   HEMOSTASIS CLIP  PLACEMENT  09/12/2018   Procedure: HEMOSTASIS CLIP PLACEMENT;  Surgeon: Wilhelmenia Aloha Raddle., MD;  Location: [facility] ENDOSCOPY;  Service: Gastroenterology;;   HEMOSTASIS CLIP PLACEMENT  06/03/2020   Procedure: HEMOSTASIS CLIP PLACEMENT;  Surgeon: Wilhelmenia Aloha Raddle., MD;  Location: [facility] ENDOSCOPY;  Service: Gastroenterology;;   I & D EXTREMITY Left 11/24/2016   Procedure: IRRIGATION AND DEBRIDEMENT LEFT HAND, THUMB, INDEX, MIDDLE, RING, AND SMALL FINGERS WITH RECONSTRUCTION;  Surgeon: Camella Fallow, MD;  Location: [facility];  Service: Orthopedics;   Laterality: Left;   KNEE ARTHROSCOPY Left    LEFT HEART CATH AND CORS/GRAFTS ANGIOGRAPHY N/A 01/05/2019   Procedure: LEFT HEART CATH AND CORS/GRAFTS ANGIOGRAPHY;  Surgeon: Mady Bruckner, MD;  Location: [facility];  Service: Cardiovascular;  Laterality: N/A;   LUMBAR LAMINECTOMY     and fusion x 2   NASAL SINUS SURGERY     POPLITEAL SYNOVIAL CYST EXCISION     REMOVAL OF STONES  04/01/2018   Procedure: REMOVAL OF STONES;  Surgeon: Wilhelmenia Aloha Raddle., MD;  Location: [facility] ENDOSCOPY;  Service: Gastroenterology;;   REMOVAL OF STONES  09/12/2018   Procedure: REMOVAL OF STONES;  Surgeon: Wilhelmenia Aloha Raddle., MD;  Location: [facility] ENDOSCOPY;  Service: Gastroenterology;;   REMOVAL OF STONES  11/28/2018   Procedure: REMOVAL OF STONES;  Surgeon: Wilhelmenia Aloha Raddle., MD;  Location: [facility] ENDOSCOPY;  Service: Gastroenterology;;   REMOVAL OF STONES  03/11/2020   Procedure: REMOVAL OF STONES;  Surgeon: Wilhelmenia Aloha Raddle., MD;  Location: [facility]COPY;  Service: Gastroenterology;;   REMOVAL OF STONES  06/03/2020   Procedure: REMOVAL OF STONES;  Surgeon: Wilhelmenia Aloha Raddle., MD;  Location: [facility] ENDOSCOPY;  Service: Gastroenterology;;   ROBOT ASSITED LAPAROSCOPIC NEPHROURETERECTOMY Right 06/03/2022   Procedure: XI ROBOT ASSITED LAPAROSCOPIC NEPHROURETERECTOMY, FLEXIBLE CYSTOSCOPY;  Surgeon: Alvaro Ricardo KATHEE Raddle., MD;  Location: [facility];  Service: Urology;  Laterality: Right;  3 HRS   SAVORY DILATION N/A 09/12/2018   Procedure: SAVORY DILATION;  Surgeon: Wilhelmenia Aloha Raddle., MD;  Location: [facility] ENDOSCOPY;  Service: Gastroenterology;  Laterality: N/A;   SAVORY DILATION N/A 11/28/2018   Procedure: SAVORY DILATION;  Surgeon: Wilhelmenia Aloha Raddle., MD;  Location: [facility] ENDOSCOPY;  Service: Gastroenterology;  Laterality: N/A;   SEPTOPLASTY Bilateral 05/26/2021   Procedure: SEPTOPLASTY;  Surgeon: Karis Clunes, MD;  Location: [facility];  Service: ENT;  Laterality: Bilateral;    SPHINCTEROTOMY  04/01/2018   Procedure: ANNETT;  Surgeon: Mansouraty, Aloha Raddle., MD;  Location: [facility] ENDOSCOPY;  Service: Gastroenterology;;   LAHOMA CHOLANGIOSCOPY N/A 11/28/2018   Procedure: DEBHOJDD CHOLANGIOSCOPY;  Surgeon: Wilhelmenia Aloha Raddle., MD;  Location: [facility] ENDOSCOPY;  Service: Gastroenterology;  Laterality: N/A;   SPYGLASS CHOLANGIOSCOPY N/A 06/03/2020   Procedure: SPYGLASS CHOLANGIOSCOPY;  Surgeon: Wilhelmenia Aloha Raddle., MD;  Location[facility] ENDOSCOPY;  Service: Gastroenterology;  Laterality: N/A;   STENT REMOVAL  09/12/2018   Procedure: STENT REMOVAL;  Surgeon: Wilhelmenia Aloha Raddle., MD;  Location: [facility] ENDOSCOPY;  Service: Gastroenterology;;   CLEDA REMOVAL  11/28/2018   Procedure: STENT REMOVAL;  Surgeon: Wilhelmenia Aloha Raddle., MD;  Location: [facility] ENDOSCOPY;  Service: Gastroenterology;;   CLEDA REMOVAL  03/11/2020   Procedure: STENT REMOVAL;  Surgeon: Wilhelmenia Aloha Raddle., MD;  Location: [facility];  Service: Gastroenterology;;   SUBMUCOSAL LIFTING INJECTION  09/12/2018   Procedure: SUBMUCOSAL LIFTING INJECTION;  Surgeon: Wilhelmenia Aloha Raddle., MD;  Location: [facility] ENDOSCOPY;  Service: Gastroenterology;;     Home Medications:  Prior to Admission medications   Medication Sig Start Date End Date  Taking? Authorizing Provider  acetaminophen  (TYLENOL ) 500 MG tablet Take 1,000 mg by mouth daily as needed for moderate pain (pain score 4-6) or mild pain (pain score 1-3).   Yes [provider]  albuterol  (VENTOLIN  HFA) 108 (90 Base) MCG/ACT inhaler Inhale 2 puffs into the lungs every 6 (six) hours as needed for wheezing or shortness of breath. 08/11/23  Yes Hunsucker, Donnice SAUNDERS, MD  amLODipine  (NORVASC ) 5 MG tablet TAKE 1 TABLET BY MOUTH DAILY 08/23/23  Yes Theophilus Andrews, Tully GRADE, MD  aspirin  EC 81 MG tablet Take 81 mg by mouth every evening. Swallow whole.   Yes [provider]  clopidogrel  (PLAVIX ) 75 MG tablet TAKE 1 TABLET BY MOUTH ONCE  DAILY  03/11/23  Yes Burnard Debby LABOR, MD  Coenzyme Q10 (CO Q-10) 300 MG CAPS Take 300 mg by mouth every evening.   Yes [provider]  cyanocobalamin  (VITAMIN B12) 1000 MCG/ML injection Inject 1 mL (1,000 mcg total) into the muscle every 30 (thirty) days. 07/20/23  Yes Theophilus Andrews, Tully GRADE, MD  ezetimibe  (ZETIA ) 10 MG tablet TAKE 1 TABLET BY MOUTH DAILY 07/19/23  Yes Theophilus Andrews, Tully GRADE, MD  ferrous sulfate 325 (65 FE) MG EC tablet Take 325 mg by mouth daily.   Yes [provider]  fluticasone  (FLONASE ) 50 MCG/ACT nasal spray Place 1 spray into both nostrils daily.   Yes [provider]  Fluticasone -Umeclidin-Vilant (TRELEGY ELLIPTA ) 200-62.5-25 MCG/ACT AEPB Inhale 1 puff into the lungs daily. 08/03/23  Yes Hunsucker, Donnice SAUNDERS, MD  isosorbide  mononitrate (IMDUR ) 60 MG 24 hr tablet TAKE 1 TABLET BY MOUTH DAILY 03/11/23  Yes Burnard Debby LABOR, MD  Menthol, Topical Analgesic, (BENGAY EX) Apply 1 application topically daily as needed (pain).   Yes [provider]  Multiple Vitamin (MULTIVITAMIN) tablet Take 1 tablet by mouth daily.   Yes [provider]  nebivolol  (BYSTOLIC ) 10 MG tablet TAKE 1 TABLET BY MOUTH DAILY 05/11/23  Yes Burnard Debby LABOR, MD  nitroGLYCERIN  (NITROSTAT ) 0.4 MG SL tablet PLACE 1 TABLET UNDER THE TONGUE EVERY 5 MINUTES AS NEEDED FOR CHEST PAIN 07/20/23  Yes Theophilus Andrews, Tully GRADE, MD  oxyCODONE -acetaminophen  (PERCOCET) 10-325 MG tablet Take 1 tablet by mouth 2 (two) times daily. Patient taking differently: Take 0.5 tablets by mouth 3 (three) times daily. 05/12/23  Yes Theophilus Andrews, Tully GRADE, MD  pantoprazole  (PROTONIX ) 40 MG tablet Take 1 tablet (40 mg total) by mouth daily. Patient taking differently: Take 40 mg by mouth every evening. 08/17/23  Yes Craig Alan SAUNDERS, PA-C  prednisoLONE acetate (PRED FORTE) 1 % ophthalmic suspension Place 1 drop into both eyes 4 (four) times daily.   Yes [provider]  rOPINIRole  (REQUIP ) 2  MG tablet TAKE 1 TABLET BY MOUTH AT  BEDTIME Patient taking differently: Take 2 mg by mouth every evening. 05/24/23  Yes Theophilus Andrews, Tully GRADE, MD  rosuvastatin  (CRESTOR ) 10 MG tablet TAKE 1 TABLET BY MOUTH DAILY 05/11/23  Yes Burnard Debby LABOR, MD  saccharomyces boulardii (FLORASTOR) 250 MG capsule Take 250 mg by mouth every evening.   Yes [provider]  sodium zirconium cyclosilicate  (LOKELMA ) 5 g packet Take 5 g by mouth 2 (two) times daily.   Yes [provider]  venlafaxine  (EFFEXOR ) 100 MG tablet Take 1 tablet (100 mg total) by mouth daily. 07/20/23  Yes Theophilus Andrews, Tully GRADE, MD  cyclobenzaprine  (FLEXERIL ) 5 MG tablet Take 1 tablet (5 mg total) by mouth at bedtime as needed for muscle  spasms. Patient not taking: Reported on 09/08/2023 07/20/23   Theophilus Andrews, Tully GRADE, MD  LOKELMA  10 g PACK packet Take 1 packet by mouth 2 (two) times daily. Patient not taking: Reported on 09/08/2023 09/01/23   [provider]  venlafaxine  (EFFEXOR ) 75 MG tablet Take 150 mg by mouth daily. Patient not taking: Reported on 08/11/2023    [provider]    Scheduled Meds:  apixaban   2.5 mg Oral BID   aspirin  EC  81 mg Oral QPM   budesonide -glycopyrrolate -formoterol   2 puff Inhalation BID   docusate sodium   100 mg Oral BID   doxycycline   100 mg Oral Q12H   ezetimibe   10 mg Oral Daily   isosorbide  mononitrate  30 mg Oral Daily   mupirocin  ointment  1 Application Nasal BID   nebivolol   10 mg Oral Daily   pantoprazole   40 mg Oral Daily   rOPINIRole   2 mg Oral QHS   rosuvastatin   10 mg Oral Daily   sodium chloride  flush  3 mL Intravenous Q12H   venlafaxine   100 mg Oral Daily   Continuous Infusions:  cefTRIAXone  (ROCEPHIN )  IV Stopped (09/11/23 2134)   PRN Meds: acetaminophen  **OR** acetaminophen , ipratropium-albuterol , nitroGLYCERIN , ondansetron  **OR** ondansetron  (ZOFRAN ) IV, oxyCODONE -acetaminophen  **AND** oxyCODONE   Allergies:   No Known Allergies  Social  History:   Social History   Socioeconomic History   Marital status: Widowed    Spouse name: Not on file   Number of children: 3   Years of education: Not on file   Highest education level: 12th grade  Occupational History   Occupation: Education officer, environmental ( part-time)  Tobacco Use   Smoking status: Former    Current packs/day: 0.00    Average packs/day: 0.5 packs/day for 5.0 years (2.5 ttl pk-yrs)    Types: Cigarettes    Start date: 03/31/1971    Quit date: 03/30/1976    Years since quitting: 47.4   Smokeless tobacco: Never  Vaping Use   Vaping status: Never Used  Substance and Sexual Activity   Alcohol use: No    Alcohol/week: 0.0 standard drinks of alcohol   Drug use: No   Sexual activity: Yes  Other Topics Concern   Not on file  Social History Narrative   Not on file   Social Drivers of Health   Financial Resource Strain: Low Risk  (09/09/2023)   Overall Financial Resource Strain (CARDIA)    Difficulty of Paying Living Expenses: Not very hard  Food Insecurity: No Food Insecurity (07/12/2023)   Hunger Vital Sign    Worried About Running Out of Food in the Last Year: Never true    Ran Out of Food in the Last Year: Never true  Transportation Needs: No Transportation Needs (07/12/2023)   PRAPARE - Administrator, Civil Service (Medical): No    Lack of Transportation (Non-Medical): No  Physical Activity: Unknown (02/10/2023)   Exercise Vital Sign    Days of Exercise per Week: 0 days    Minutes of Exercise per Session: Not on file  Recent Concern: Physical Activity - Inactive (02/10/2023)   Exercise Vital Sign    Days of Exercise per Week: 0 days    Minutes of Exercise per Session: 30 min  Stress: No Stress Concern Present (02/10/2023)   [employer] of Occupational Health - Occupational Stress Questionnaire    Feeling of Stress : Not at all  Social Connections: Moderately Integrated (07/08/2023)   Social Connection and Isolation Panel  Frequency of Communication  with Friends and Family: More than three times a week    Frequency of Social Gatherings with Friends and Family: Twice a week    Attends Religious Services: More than 4 times per year    Active Member of [employer] or Organizations: Yes    Attends Banker Meetings: More than 4 times per year    Marital Status: Widowed  Intimate Partner Violence: Not At Risk (07/08/2023)   Humiliation, Afraid, Rape, and Kick questionnaire    Fear of Current or Ex-Partner: No    Emotionally Abused: No    Physically Abused: No    Sexually Abused: No    Family History:    Family History  Problem Relation Age of Onset   Melanoma Mother    Stroke Father    Hypertension Father    Coronary artery disease Other    Colon cancer Neg Hx    Esophageal cancer Neg Hx    Stomach cancer Neg Hx    Rectal cancer Neg Hx    Pancreatic cancer Neg Hx    Liver disease Neg Hx    Inflammatory bowel disease Neg Hx      ROS:  Please see the history of present illness.   All other ROS reviewed and negative.     Physical Exam/Data: Vitals:   09/11/23 2346 09/12/23 0349 09/12/23 0500 09/12/23 0730  BP: 130/65 123/63  124/67  Pulse: 87 77 77 76  Resp: 19 17 19 18   Temp: 98.2 F (36.8 C) 97.9 F (36.6 C)  97.8 F (36.6 C)  TempSrc: Oral Oral  Oral  SpO2: 90% 96% 97% 98%  Weight:   68.4 kg   Height:        Intake/Output Summary (Last 24 hours) at 09/12/2023 0831 Last data filed at 09/12/2023 0349 Gross per 24 hour  Intake 1060 ml  Output 2200 ml  Net -1140 ml      09/12/2023    5:00 AM 09/11/2023    4:26 AM 09/10/2023    7:10 AM  Last 3 Weights  Weight (lbs) 150 lb 12.7 oz 154 lb 15.7 oz 155 lb 6.8 oz  Weight (kg) 68.4 kg 70.3 kg 70.5 kg     Body mass index is 22.27 kg/m.  General:  elderly male in NAD, first seen lying flat HEENT: normal Neck: + JVD Cardiac:  irregular rhythm, regular rate Lungs:  crackles throughout, L > R Abd: mildly tender Ext: no edema Musculoskeletal:  No  deformities, BUE and BLE strength normal and equal Skin: warm and dry  Neuro:  CNs 2-12 intact, no focal abnormalities noted Psych:  Normal affect   EKG:  The EKG was personally reviewed and demonstrates:  atrial flutter with 3:1 and 4:1 conduction, rate controlled 77 Telemetry:  Telemetry was personally reviewed and demonstrates:  atrial flutter with rate 70s  Relevant CV Studies:  Echo 09/08/23:  1. Left ventricular ejection fraction, by estimation, is 50 to 55%. The  left ventricle has low normal function. The left ventricle has no regional  wall motion abnormalities. Left ventricular diastolic parameters are  indeterminate.   2. Right ventricular systolic function is normal. The right ventricular  size is normal. Tricuspid regurgitation signal is inadequate for assessing  PA pressure.   3. Left atrial size was moderately dilated.   4. The mitral valve is normal in structure. Trivial mitral valve  regurgitation. No evidence of mitral stenosis.   5. The aortic valve is tricuspid. There  is mild calcification of the  aortic valve. Aortic valve regurgitation is not visualized. Aortic valve  sclerosis/calcification is present, without any evidence of aortic  stenosis.   6. The inferior vena cava is normal in size with greater than 50%  respiratory variability, suggesting right atrial pressure of 3 mmHg.   Laboratory Data: High Sensitivity Troponin:  No results for input(s): TROPONINIHS in the last 720 hours.   Chemistry Recent Labs  Lab 09/10/23 0734 09/11/23 0229 09/12/23 0256  NA 139 137 139  K 3.8 3.8 4.0  CL 100 100 100  CO2 27 26 27   GLUCOSE 112* 108* 114*  BUN 56* 59* 62*  CREATININE 3.11* 2.93* 3.11*  CALCIUM  8.5* 8.2* 8.6*  GFRNONAA 19* 21* 19*  ANIONGAP 12 11 12     Recent Labs  Lab 09/07/23 2019 09/08/23 0639 09/09/23 0227  PROT 7.4 6.6 6.1*  ALBUMIN  4.0 2.9* 2.6*  AST 37 21 18  ALT 18 16 14   ALKPHOS 89 68 58  BILITOT 0.6 1.2 1.0   Lipids No results  for input(s): CHOL, TRIG, HDL, LABVLDL, LDLCALC, CHOLHDL in the last 168 hours.  Hematology Recent Labs  Lab 09/09/23 0227 09/11/23 0229 09/12/23 0256  WBC 10.7* 9.7 7.7  RBC 3.22* 3.07* 3.32*  HGB 9.2* 8.8* 9.5*  HCT 28.3* 26.8* 29.6*  MCV 87.9 87.3 89.2  MCH 28.6 28.7 28.6  MCHC 32.5 32.8 32.1  RDW 14.4 14.5 14.3  PLT 153 188 191   Thyroid  No results for input(s): TSH, FREET4 in the last 168 hours.  BNP Recent Labs  Lab 09/07/23 2019  PROBNP 13,045.0*    DDimer No results for input(s): DDIMER in the last 168 hours.  Radiology/Studies:  DG Chest 2 View Result Date: 09/10/2023 CLINICAL DATA:  [id number] Hypoxia [id number] EXAM: CHEST - 2 VIEW COMPARISON:  09/07/2023. FINDINGS: Stable cardiomediastinal contours. Prior median sternotomy and CABG. Low lung volumes. Similar small bilateral pleural effusions. Similar bilateral interstitial prominence, suggestive of pulmonary edema, with patchy bilateral airspace opacities, more pronounced on the right. Visualized osseous structures are unchanged. IMPRESSION: 1. Small bilateral pleural effusions, not significantly changed. 2. Similar bilateral interstitial prominence, suggestive of pulmonary edema, with patchy bilateral airspace opacities, more pronounced on the right. Electronically Signed   By: Harrietta Sherry M.D.   On: 09/10/2023 18:05   ECHOCARDIOGRAM COMPLETE Result Date: 09/08/2023    ECHOCARDIOGRAM REPORT   Patient Name:   [name] Date of Exam: 09/08/2023 Medical Rec #:  [id number]         Height:       69.0 in Accession #:    [id number]        Weight:       160.3 lb Date of Birth:  [birth year]         BSA:          1.880 m Patient Age:    83 years          BP:           141/63 mmHg Patient Gender: M                 HR:           100 bpm. Exam Location:  Inpatient Procedure: 2D Echo, Cardiac Doppler and Color Doppler (Both Spectral and Color            Flow Doppler were utilized during procedure). Indications:     I50.40* Unspecified combined systolic (congestive) and diastolic                 (  congestive) heart failure  History:        Patient has prior history of Echocardiogram examinations, most                 recent 05/12/2022. CAD and Previous Myocardial Infarction, Prior                 CABG, Arrythmias:Atrial Fibrillation, Signs/Symptoms:Shortness                 of Breath and Dyspnea; Risk Factors:Dyslipidemia, Diabetes and                 Hypertension. Cancer.  Sonographer:    Ellouise Mose RDCS Referring Phys: [id number] [facility]  Sonographer Comments: Technically difficult study due to poor echo windows. Patient in high fowler's position. Patient in AFIB and destatted to 72 during exam. IMPRESSIONS  1. Left ventricular ejection fraction, by estimation, is 50 to 55%. The left ventricle has low normal function. The left ventricle has no regional wall motion abnormalities. Left ventricular diastolic parameters are indeterminate.  2. Right ventricular systolic function is normal. The right ventricular size is normal. Tricuspid regurgitation signal is inadequate for assessing PA pressure.  3. Left atrial size was moderately dilated.  4. The mitral valve is normal in structure. Trivial mitral valve regurgitation. No evidence of mitral stenosis.  5. The aortic valve is tricuspid. There is mild calcification of the aortic valve. Aortic valve regurgitation is not visualized. Aortic valve sclerosis/calcification is present, without any evidence of aortic stenosis.  6. The inferior vena cava is normal in size with greater than 50% respiratory variability, suggesting right atrial pressure of 3 mmHg. FINDINGS  Left Ventricle: Left ventricular ejection fraction, by estimation, is 50 to 55%. The left ventricle has low normal function. The left ventricle has no regional wall motion abnormalities. The left ventricular internal cavity size was normal in size. There is no left ventricular hypertrophy. Left ventricular diastolic  parameters are indeterminate. Right Ventricle: The right ventricular size is normal. No increase in right ventricular wall thickness. Right ventricular systolic function is normal. Tricuspid regurgitation signal is inadequate for assessing PA pressure. Left Atrium: Left atrial size was moderately dilated. Right Atrium: Right atrial size was normal in size. Pericardium: There is no evidence of pericardial effusion. Mitral Valve: The mitral valve is normal in structure. Mild mitral annular calcification. Trivial mitral valve regurgitation. No evidence of mitral valve stenosis. Tricuspid Valve: The tricuspid valve is normal in structure. Tricuspid valve regurgitation is trivial. No evidence of tricuspid stenosis. Aortic Valve: The aortic valve is tricuspid. There is mild calcification of the aortic valve. Aortic valve regurgitation is not visualized. Aortic valve sclerosis/calcification is present, without any evidence of aortic stenosis. Pulmonic Valve: The pulmonic valve was normal in structure. Pulmonic valve regurgitation is trivial. No evidence of pulmonic stenosis. Aorta: The aortic root is normal in size and structure. Venous: The inferior vena cava is normal in size with greater than 50% respiratory variability, suggesting right atrial pressure of 3 mmHg. IAS/Shunts: No atrial level shunt detected by color flow Doppler.  LEFT VENTRICLE PLAX 2D LVIDd:         5.02 cm LVIDs:         3.65 cm LV PW:         1.13 cm LV IVS:        1.09 cm LVOT diam:     2.38 cm LV SV:         77 LV SV Index:  41 LVOT Area:     4.45 cm  LV Volumes (MOD) LV vol d, MOD A2C: 67.4 ml LV vol d, MOD A4C: 95.3 ml LV vol s, MOD A2C: 36.9 ml LV vol s, MOD A4C: 40.7 ml LV SV MOD A2C:     30.5 ml LV SV MOD A4C:     95.3 ml LV SV MOD BP:      42.5 ml RIGHT VENTRICLE            IVC RV S prime:     9.90 cm/s  IVC diam: 1.37 cm TAPSE (M-mode): 1.2 cm LEFT ATRIUM             Index        RIGHT ATRIUM           Index LA diam:        4.99 cm 2.65  cm/m   RA Area:     12.80 cm LA Vol (A2C):   28.4 ml 15.10 ml/m  RA Volume:   32.30 ml  17.18 ml/m LA Vol (A4C):   45.8 ml 24.36 ml/m LA Biplane Vol: 37.5 ml 19.94 ml/m  AORTIC VALVE LVOT Vmax:   104.00 cm/s LVOT Vmean:  67.600 cm/s LVOT VTI:    0.173 m  AORTA Ao Root diam: 3.42 cm MITRAL VALVE MV Area (PHT): 3.99 cm     SHUNTS MV Decel Time: 190 msec     Systemic VTI:  0.17 m MV E velocity: 164.00 cm/s  Systemic Diam: 2.38 cm MV A velocity: 110.00 cm/s MV E/A ratio:  1.49 Toribio Fuel MD Electronically signed by Toribio Fuel MD Signature Date/Time: 09/08/2023/3:25:14 PM    Final      Assessment and Plan:  Atrial flutter - new diagnosis this admission - rate controlled on nebivolol  - was in 4:1 atrial flutter on arrival, also noted during echo - telemetry with rate controlled flutter - continue BB - would benefit from DCCV, but likely could not do TEE with esophageal stricture - may ultimately need to anticoagulated for 3 weeks and then OP DCCV   Chronic anticoagulation - has been started on low dose eliquis  for age and creatinine - will stop ASA, continue plavix    CAD s/p CABG x 4 Overlapping DES x 3 placed in SVG-PDA in 2020 - lifelong DAPT with ASA and plavix  - remains on imdur  30 mg and bystolic  - hs troponin 47, no delta available - no chest pain - will stop ASA and restart plavix  in the setting of eliquis    Hx of cardiomyopathy - HFimEF - possibly ischemic cardiomyopathy in the setting of SVG stenosis treated with DEX x 3 in 2020 - LVEF improved from 40-45% to 55% with revascularization and continued medical therapy - echo this admission with LVEF low normal at 50-55% - has received targeted doses of IV lasix  - question if current respiratory failure due to atrial flutter vs PNA - remains volume up with crackles in bases L > R - may need RHC along with nephrology consult to help with diuresis   PNA - code sepsis initiated on arrival - leukocytosis has  improved - remains on ABX, but remains on O2 and SOB   Difficult situation. He would benefit from DCCV; however, was in flutter on admission with start of OAC last night. He would need a TEE-DCCV once more euvolemic and PNA resolved. However, given recent esophagram with stricture and plans for esophageal dilation this month.   He remains volume up on  exam. However, diuresis complicated by solitary kidney and acute on chronic renal failure with baseline CKD IV. Recommend consulting nephrology. Will likely also need a right heart cath tomorrow to help determine volume status.   Stop ASA, restart plavix . Continue BB and eliquis .    Risk Assessment/Risk Scores:       [place]  Heart Association (NYHA) Functional Class NYHA Class III  CHA2DS2-VASc Score = 6   This indicates a 9.7% annual risk of stroke. The patient's score is based upon: CHF History: 1 HTN History: 1 Diabetes History: 1 Stroke History: 0 Vascular Disease History: 1 Age Score: 2 Gender Score: 0      For questions or updates, please contact [facility] Please consult [email or web address] for contact info under    Signed, Jon Nat Hails, [place]  09/12/2023 8:31 AM  Attending Note  Patient seen and discussed with PA Duke, I agree with her documentation. 83 yo male history of CAD with CABG in 2011 LIMA to his LAD, vein to the marginal, vein to the RCA. His last catheterization from January 05, 2019 showed a 99% subtotally occluded vein graft to his right PDA/PL vessel which was treated successfully with 3 overlapping Resolute Onyx DES stents with recs for DAPT indefintely.   History of COPD, HTN, HLD, renal cell CA s/p neprhectomy, CKD IV. Admitted with SOB, productive cough. In ER hypoxic to 89% on RA.    Admit labs WBC 15.2 Hgb 10.6 Plt 203 K 4.4 Cr 2.65 BUN 37 ProBBP 13,045 Lactic acid 1.4 Procalc 0.36 Trop 47--> EKG: rate controlled aflutter CXR: mild HF, LLL patch opacity possible pneumonia.  CT  chest: suggestive of pulm edema, moderate right pleural effusions, loculated fluid left major fissure.  Repeat CXR: small bilateral effusions, probably edema with patchy airspace opacity Echo: LVEF 55%, indet diastolic function, normal RV, mod LAE. Normal IVC   1.SOB -appears multifactorial in setting of COPD with evidence of probable pneumonia and also volume overload - COPD/pneumonia per primary team  2. Acute HFpEF - Echo: LVEF 55%, indet diastolic function, normal RV, mod LAE. Normal IVC -proBNP 13,045 -CXR: mild HF, LLL patch opacity possible pneumonia.  -CT chest: suggestive of pulm edema, moderate right pleural effusions, loculated fluid left major fissure  - received IV lasix  40mg  x 1 yesterday. Negative 1.1 L yesterday and 4.5 L documented since admission. Diuresis has been limited by uptrend in Cr. Cr baseline around  2.6 to 2.8, higher Cr in 06/2023 admitted with cdiff.  - accept higher Cr for now to further diurese. Normal RV and LV systolic function, the rise in Cr with diuresis  is not a low cardiac output issue but likely limited by solitary poorly functioning kidney.  -If progressive decline in renal function may warrant RHC to better assess volume status and also nephrology consultation. Dose IV lasix  40mg  today and follow clinically.  - GFR 19, would avoid SGLT2i   2.CAD - CABG in 2011 LIMA to his LAD, vein to the marginal, vein to the RCA.  - January 05, 2019 showed a 99% subtotally occluded vein graft to his right PDA/PL vessel which was treated successfully with 3 overlapping Resolute Onyx DES stents with recs for DAPT indefintely.  - started on eliquis  for new aflutter, would d/c ASA and continue plavix .    3. CKD IV - single kidney, prior nephrectomy.  - baseline Cr 2.6 to 2.8  4. Aflutter - new diagnosis this admission - has been started on eliquis  2.5mg  bid.  -  well rate controlled on his home bystoIic. No urgent indication for DCCV - TEE would also be  complicated by esophageal stricture, ongoing dysphagia with food and pills. Had plans for dilatation per patient, if DCCV that would also need to be postponed 1 month.  - continue rate control of his flutter -possibly consider DCCV after 3 weeks of anticoag when recovered from pneumonia and once dysphagia/esophageal stricture has been addressed  5. Sepsis/CAP - per primary team  Dorn Ross MD

## 2023-09-12 NOTE — Progress Notes (Incomplete)
 Attending Note  Patient seen and discussed with PA Duke, I agree with her documentation. 83 yo male history of CAD with CABG in 2011 LIMA to his LAD, vein to the marginal, vein to the RCA. His last catheterization from January 05, 2019 showed a 99% subtotally occluded vein graft to his right PDA/PL vessel which was treated successfully with 3 overlapping Resolute Onyx DES stents with recs for DAPT indefintely.   History of COPD, HTN, HLD, renal cell CA s/p neprhectomy, CKD IV. Admitted with SOB, productive cough. In ER hypoxic to 89% on RA.    Admit labs WBC 15.2 Hgb 10.6 Plt 203 K 4.4 Cr 2.65 BUN 37 ProBBP 13,045 Lactic acid 1.4 Procalc 0.36 Trop 47--> EKG: rate controlled aflutter CXR: mild HF, LLL patch opacity possible pneumonia.  CT chest: suggestive of pulm edema, moderate right pleural effusions, loculated fluid left major fissure.  Repeat CXR: small bilateral effusions, probably edema with patchy airspace opacity Echo: LVEF 55%, indet diastolic function, normal RV, mod LAE. Normal IVC   1.SOB -appears multifactorial in setting of COPD with evidence of probable pneumonia and also volume overload - COPD/pneumonia per primary team  2. Acute HFpEF - Echo: LVEF 55%, indet diastolic function, normal RV, mod LAE. Normal IVC -proBNP 13,045 -CXR: mild HF, LLL patch opacity possible pneumonia.  -CT chest: suggestive of pulm edema, moderate right pleural effusions, loculated fluid left major fissure  - received IV lasix  40mg  x 1 yesterday. Negative 1.1 L yesterday and 4.5 L documented since admission. Diuresis has been limited by uptrend in Cr. Cr baseline around  2.6 to 2.8, higher Cr in 06/2023 admitted with cdiff.  - accept higher Cr for now to further diurese. Normal RV and LV systolic function, the rise in Cr with diuresis  is not a low cardiac output issue.  -If progressive decline in renal function may warrant RHC to better assess volume status and also nephrology consultation. Dose  IV lasix  40mg  today and follow clinically.    2.CAD - CABG in 2011 LIMA to his LAD, vein to the marginal, vein to the RCA.  - January 05, 2019 showed a 99% subtotally occluded vein graft to his right PDA/PL vessel which was treated successfully with 3 overlapping Resolute Onyx DES stents with recs for DAPT indefintely.  - started on eliquis  for new aflutter, would d/c ASA and continue plavix .    3. CKD IV - single kidney, prior nephrectomy.  - baseline Cr 2.6 to 2.8  4. Aflutter - new diagnosis this admission - has been started on eliquis  2.5mg  bid.  - well rate controlled on his home bystoIic. No urgent indication for DCCV - TEE would also be complicated by esophageal stricture, ongoing dysphagia with food and pills. Had plans for dilatation per patient, if DCCV that would also need to be postponed 1 month.  - continue rate control of his flutter -possibly consider DCCV after 3 weeks of anticoag when recovered from pneumonia and once dysphagia/esophageal stricture has been addressed  5. Sepsis/CAP - per primary team  Dorn Ross MD

## 2023-09-12 NOTE — Consult Note (Signed)
 Renal Service Consult Note [facility]ar JONETTA Fret, MD  Patient: [name] Date: 09/12/2023 Requesting Physician: Dr. Jonel  Reason for Consult: Renal failure HPI: The patient is a 83 y.o. year-old w/ PMH of CAD hx CABG, bladder cancer and renal cell cancer, CKD, COPD, depression, HL, HTN who presented on 09/07/2023 complaining of shortness of breath.  He was taking his nitroglycerin  but did not have chest pain.  Also coughing.  Per family he was hospitalized the prior month for sepsis.  Also having potassium issues from having 1 of his kidneys removed.  In the ED patient was treated both for pneumonia and fluid overload, got IV Lasix  40 mg and Levaquin .  He was admitted to [facility].  Chest x-ray showed left lower no lobar pneumonia also fluid overload/ pulmonary edema.  CT chest also done.  Patient was admitted for new onset CHF with multifocal pneumonia, new onset atrial flutter and abnormal chest CT. with antibiotics the white count and fever resolved.  He got repeat IV Lasix .  Last heart catheterization 2020 with severe multivessel disease, no PCI, recommended indefinite aspirin  and Plavix  at that time.  Creatinine was 2.6 on admission and then went up to 2.9- 3.1 range.  We are asked to see for acute on chronic renal failure.   Pt seen in room. Underwent R nephrectomy for kidney cancer in may 2024. States he has had bladder cancer 8 times rx'd w/ cystoscopic procedures. States his SOB improved the most the 1st day he was here, since then has been slowly improving day by day. Is not on home O2.  Does not swell in the legs, even when vol overloaded. Only remembers one other time he was in hospital for vol overload and that was post-CABG.    ROS - denies CP, no joint pain, no HA, no blurry vision, no rash, no diarrhea, no nausea/ vomiting   Past Medical History  Past Medical History:  Diagnosis Date   Allergy     Anxiety    B12 deficiency anemia    Blood  transfusion without reported diagnosis    CAD (coronary artery disease)    Cancer (HCC)    bladder, Right renal cell also   Chronic kidney disease    Colon polyps    COPD (chronic obstructive pulmonary disease) (HCC)    Depression    Diabetes mellitus (HCC)    Dyspnea    Esophagus, Barrett's    GERD (gastroesophageal reflux disease)    Headache    History of bladder cancer    Bladder cancer 8 times   History of hiatal hernia    Hyperlipidemia    Hypertension    Localized osteoarthrosis, lower leg    Myocardial infarction (HCC) 2021   Pneumonia    Restless leg syndrome    Sleep apnea    does not wear cpap   Stenosis of esophagus    Past Surgical History  Past Surgical History:  Procedure Laterality Date   ABDOMINAL AORTOGRAM N/A 08/03/2023   Procedure: ABDOMINAL AORTOGRAM;  Surgeon: Serene Gaile LELON, MD;  [facility];  Service: Cardiovascular;  Laterality: N/A;   BILIARY BRUSHING  04/01/2018   Procedure: BILIARY BRUSHING;  Surgeon: Wilhelmenia Aloha Raddle., MD;  Location: [facility] ENDOSCOPY;  Service: Gastroenterology;;   BILIARY BRUSHING  09/12/2018   Procedure: BILIARY BRUSHING;  Surgeon: Wilhelmenia Aloha Raddle., MD;  Location: [facility] ENDOSCOPY;  Service: Gastroenterology;;   BILIARY BRUSHING  11/28/2018   Procedure: BILIARY BRUSHING;  Surgeon: Wilhelmenia Aloha Raddle., MD;  Location: [facility] ENDOSCOPY;  Service: Gastroenterology;;   BILIARY BRUSHING  03/11/2020   Procedure: BILIARY BRUSHING;  Surgeon: Wilhelmenia Aloha Raddle., MD;  Location: [facility] ENDOSCOPY;  Service: Gastroenterology;;   BILIARY DILATION  09/12/2018   Procedure: BILIARY DILATION;  Surgeon: Wilhelmenia Aloha Raddle., MD;  Location: [facility] ENDOSCOPY;  Service: Gastroenterology;;   BILIARY DILATION  11/28/2018   Procedure: BILIARY DILATION;  Surgeon: Wilhelmenia Aloha Raddle., MD;  Location: [facility] ENDOSCOPY;  Service: Gastroenterology;;   BILIARY DILATION  03/11/2020   Procedure: BILIARY DILATION;  Surgeon: Wilhelmenia Aloha Raddle., MD;  Location: [facility] ENDOSCOPY;  Service: Gastroenterology;;   BILIARY STENT PLACEMENT  04/01/2018   Procedure: BILIARY STENT PLACEMENT;  Surgeon: Wilhelmenia Aloha Raddle., MD;  Location: [facility] ENDOSCOPY;  Service: Gastroenterology;;   BILIARY STENT PLACEMENT  09/12/2018   Procedure: BILIARY STENT PLACEMENT;  Surgeon: Wilhelmenia Aloha Raddle., MD;  Location: [facility] ENDOSCOPY;  Service: Gastroenterology;;   BILIARY STENT PLACEMENT  11/28/2018   Procedure: BILIARY STENT PLACEMENT;  Surgeon: Wilhelmenia Aloha Raddle., MD;  Location: [facility] ENDOSCOPY;  Service: Gastroenterology;;   BIOPSY  04/01/2018   Procedure: BIOPSY;  Surgeon: Wilhelmenia Aloha Raddle., MD;  Location: [facility] ENDOSCOPY;  Service: Gastroenterology;;   BIOPSY  09/12/2018   Procedure: BIOPSY;  Surgeon: Wilhelmenia Aloha Raddle., MD;  Location: [facility] ENDOSCOPY;  Service: Gastroenterology;;   BIOPSY  11/28/2018   Procedure: BIOPSY;  Surgeon: Wilhelmenia Aloha Raddle., MD;  Location: [facility] ENDOSCOPY;  Service: Gastroenterology;;   BIOPSY  03/11/2020   Procedure: BIOPSY;  Surgeon: Wilhelmenia Aloha Raddle., MD;  Location: [facility] ENDOSCOPY;  Service: Gastroenterology;;   bladder cancer      x 8 cystoscopy   CERVICAL DISCECTOMY     ACDF   CHOLECYSTECTOMY     COLONOSCOPY  11/17/2005   normal    CORONARY ARTERY BYPASS GRAFT     x4   CORONARY STENT INTERVENTION N/A 01/05/2019   Procedure: CORONARY STENT INTERVENTION;  Surgeon: Mady Bruckner, MD;  [facility];  Service: Cardiovascular;  Laterality: N/A;   CYSTOSCOPY WITH BIOPSY N/A 03/27/2022   Procedure: CYSTOSCOPY WITH BIOPSY;  Surgeon: Watt Rush, MD;  Location: [facility] ORS;  Service: Urology;  Laterality: N/A;   CYSTOSCOPY WITH RETROGRADE PYELOGRAM, URETEROSCOPY AND STENT PLACEMENT Right 03/27/2022   Procedure: CYSTOSCOPY WITH RIGHT RETROGRADE PYELOGRAM, URETEROSCOPY AND STENT PLACEMENT urethral dilation;  Surgeon: Watt Rush, MD;  Location: [facility] ORS;  Service: Urology;  Laterality: Right;    ENDOSCOPIC MUCOSAL RESECTION  09/12/2018   Procedure: ENDOSCOPIC MUCOSAL RESECTION;  Surgeon: Wilhelmenia Aloha Raddle., MD;  Location: [facility] ENDOSCOPY;  Service: Gastroenterology;;   ENDOSCOPIC RETROGRADE CHOLANGIOPANCREATOGRAPHY (ERCP) WITH PROPOFOL  N/A 04/01/2018   Procedure: ENDOSCOPIC RETROGRADE CHOLANGIOPANCREATOGRAPHY (ERCP) WITH PROPOFOL ;  Surgeon: Wilhelmenia Aloha Raddle., MD;  Location: [facility] ENDOSCOPY;  Service: Gastroenterology;  Laterality: N/A;   ENDOSCOPIC RETROGRADE CHOLANGIOPANCREATOGRAPHY (ERCP) WITH PROPOFOL  N/A 09/12/2018   Procedure: ENDOSCOPIC RETROGRADE CHOLANGIOPANCREATOGRAPHY (ERCP) WITH PROPOFOL ;  Surgeon: Wilhelmenia Aloha Raddle., MD;  Location: [facility] ENDOSCOPY;  Service: Gastroenterology;  Laterality: N/A;   ENDOSCOPIC RETROGRADE CHOLANGIOPANCREATOGRAPHY (ERCP) WITH PROPOFOL  N/A 03/11/2020   Procedure: ENDOSCOPIC RETROGRADE CHOLANGIOPANCREATOGRAPHY (ERCP) WITH PROPOFOL ;  Surgeon: Wilhelmenia Aloha Raddle., MD;  Location: [facility] ENDOSCOPY;  Service: Gastroenterology;  Laterality: N/A;   ENDOSCOPIC RETROGRADE CHOLANGIOPANCREATOGRAPHY (ERCP) WITH PROPOFOL  N/A 06/03/2020   Procedure: ENDOSCOPIC RETROGRADE CHOLANGIOPANCREATOGRAPHY (ERCP) WITH PROPOFOL ;  Surgeon: Wilhelmenia Aloha Raddle., MD;  Location: [facility] ENDOSCOPY;  Service: Gastroenterology;  Laterality: N/A;   ERCP N/A 11/28/2018   Procedure: ENDOSCOPIC RETROGRADE CHOLANGIOPANCREATOGRAPHY (ERCP) +EGD  with spyglass;  Surgeon: Mansouraty, Aloha Raddle., MD;  Location: [facility] ENDOSCOPY;  Service: Gastroenterology;  Laterality: N/A;   ESOPHAGOGASTRODUODENOSCOPY  04/29/2010   ESOPHAGOGASTRODUODENOSCOPY (EGD) WITH PROPOFOL  N/A 04/01/2018   Procedure: ESOPHAGOGASTRODUODENOSCOPY (EGD) WITH PROPOFOL ;  Surgeon: Wilhelmenia Aloha Raddle., MD;  Location: [facility] ENDOSCOPY;  Service: Gastroenterology;  Laterality: N/A;   ESOPHAGOGASTRODUODENOSCOPY (EGD) WITH PROPOFOL  N/A 09/12/2018   Procedure: ESOPHAGOGASTRODUODENOSCOPY (EGD) WITH PROPOFOL ;  Surgeon: Wilhelmenia  Aloha Raddle., MD;  Location: [facility] ENDOSCOPY;  Service: Gastroenterology;  Laterality: N/A;   ESOPHAGOGASTRODUODENOSCOPY (EGD) WITH PROPOFOL  N/A 11/28/2018   Procedure: ESOPHAGOGASTRODUODENOSCOPY (EGD) WITH PROPOFOL ;  Surgeon: Wilhelmenia Aloha Raddle., MD;  Location: [facility] ENDOSCOPY;  Service: Gastroenterology;  Laterality: N/A;   ESOPHAGOGASTRODUODENOSCOPY (EGD) WITH PROPOFOL  N/A 03/11/2020   Procedure: ESOPHAGOGASTRODUODENOSCOPY (EGD) WITH PROPOFOL ;  Surgeon: Wilhelmenia Aloha Raddle., MD;  Location: [facility] ENDOSCOPY;  Service: Gastroenterology;  Laterality: N/A;   ESOPHAGOGASTRODUODENOSCOPY (EGD) WITH PROPOFOL  N/A 06/03/2020   Procedure: ESOPHAGOGASTRODUODENOSCOPY (EGD) WITH PROPOFOL ;  Surgeon: Wilhelmenia Aloha Raddle., MD;  Location: [facility] ENDOSCOPY;  Service: Gastroenterology;  Laterality: N/A;   EUS  04/01/2018   Procedure: FULL UPPER ENDOSCOPIC ULTRASOUND (EUS) RADIAL;  Surgeon: Wilhelmenia Aloha Raddle., MD;  Location: [facility] ENDOSCOPY;  Service: Gastroenterology;;   EUS N/A 09/12/2018   Procedure: UPPER ENDOSCOPIC ULTRASOUND (EUS) RADIAL;  Surgeon: Wilhelmenia Aloha Raddle., MD;  Location: [facility] ENDOSCOPY;  Service: Gastroenterology;  Laterality: N/A;   FINE NEEDLE ASPIRATION  09/12/2018   Procedure: FINE NEEDLE ASPIRATION (FNA) LINEAR;  Surgeon: Wilhelmenia Aloha Raddle., MD;  Location: [facility] ENDOSCOPY;  Service: Gastroenterology;;   HAND SURGERY Left 2018   saw accident   HEMOSTASIS CLIP PLACEMENT  09/12/2018   Procedure: HEMOSTASIS CLIP PLACEMENT;  Surgeon: Wilhelmenia Aloha Raddle., MD;  Location: [facility] ENDOSCOPY;  Service: Gastroenterology;;   HEMOSTASIS CLIP PLACEMENT  06/03/2020   Procedure: HEMOSTASIS CLIP PLACEMENT;  Surgeon: Wilhelmenia Aloha Raddle., MD;  Location: [facility] ENDOSCOPY;  Service: Gastroenterology;;   I & D EXTREMITY Left 11/24/2016   Procedure: IRRIGATION AND DEBRIDEMENT LEFT HAND, THUMB, INDEX, MIDDLE, RING, AND SMALL FINGERS WITH RECONSTRUCTION;  Surgeon: Camella Fallow, MD;  Location: [facility];  Service:  Orthopedics;  Laterality: Left;   KNEE ARTHROSCOPY Left    LEFT HEART CATH AND CORS/GRAFTS ANGIOGRAPHY N/A 01/05/2019   Procedure: LEFT HEART CATH AND CORS/GRAFTS ANGIOGRAPHY;  Surgeon: Mady Bruckner, MD;  [facility]: [facility];  Service: Cardiovascular;  Laterality: N/A;   LUMBAR LAMINECTOMY     and fusion x 2   NASAL SINUS SURGERY     POPLITEAL SYNOVIAL CYST EXCISION     REMOVAL OF STONES  04/01/2018   Procedure: REMOVAL OF STONES;  Surgeon: Wilhelmenia Aloha Raddle., MD;  Location: [facility] ENDOSCOPY;  Service: Gastroenterology;;   REMOVAL OF STONES  09/12/2018   Procedure: REMOVAL OF STONES;  Surgeon: Wilhelmenia Aloha Raddle., MD;  Location: [facility] ENDOSCOPY;  Service: Gastroenterology;;   REMOVAL OF STONES  11/28/2018   Procedure: REMOVAL OF STONES;  Surgeon: Wilhelmenia Aloha Raddle., MD;  Location: [facility] ENDOSCOPY;  Service: Gastroenterology;;   REMOVAL OF STONES  03/11/2020   Procedure: REMOVAL OF STONES;  Surgeon: Wilhelmenia Aloha Raddle., MD;  Location: [facility] ENDOSCOPY;  Service: Gastroenterology;;   REMOVAL OF STONES  06/03/2020   Procedure: REMOVAL OF STONES;  Surgeon: Wilhelmenia Aloha Raddle., MD;  Location: [facility] ENDOSCOPY;  Service: Gastroenterology;;   ROBOT ASSITED LAPAROSCOPIC NEPHROURETERECTOMY Right 06/03/2022   Procedure: XI ROBOT ASSITED LAPAROSCOPIC NEPHROURETERECTOMY, FLEXIBLE CYSTOSCOPY;  Surgeon: Alvaro Ricardo KATHEE Raddle., MD;  Location: [facility] ORS;  Service: Urology;  Laterality: Right;  3 HRS   SAVORY DILATION N/A 09/12/2018   Procedure: SAVORY DILATION;  Surgeon: Wilhelmenia Aloha Raddle., MD;  Location: [facility] ENDOSCOPY;  Service: Gastroenterology;  Laterality: N/A;   SAVORY DILATION N/A 11/28/2018   Procedure: SAVORY DILATION;  Surgeon: Wilhelmenia Aloha Raddle., MD;  Location: [facility] ENDOSCOPY;  Service: Gastroenterology;  Laterality: N/A;   SEPTOPLASTY Bilateral 05/26/2021   Procedure: SEPTOPLASTY;  Surgeon: Karis Clunes, MD;  Location: [facility];  Service: ENT;  Laterality:  Bilateral;   SPHINCTEROTOMY  04/01/2018   Procedure: ANNETT;  Surgeon: Mansouraty, Aloha Raddle., MD;  Location: [facility] ENDOSCOPY;  Service: Gastroenterology;;   LAHOMA CHOLANGIOSCOPY N/A 11/28/2018   Procedure: DEBHOJDD CHOLANGIOSCOPY;  Surgeon: Wilhelmenia Aloha Raddle., MD;  Location: [facility] ENDOSCOPY;  Service: Gastroenterology;  Laterality: N/A;   SPYGLASS CHOLANGIOSCOPY N/A 06/03/2020   Procedure: SPYGLASS CHOLANGIOSCOPY;  Surgeon: Wilhelmenia Aloha Raddle., MD;  Location: [facility] ENDOSCOPY;  Service: Gastroenterology;  Laterality: N/A;   STENT REMOVAL  09/12/2018   Procedure: STENT REMOVAL;  Surgeon: Wilhelmenia Aloha Raddle., MD;  Location: [facility] ENDOSCOPY;  Service: Gastroenterology;;   CLEDA REMOVAL  11/28/2018   Procedure: STENT REMOVAL;  Surgeon: Wilhelmenia Aloha Raddle., MD;  Location: [facility] ENDOSCOPY;  Service: Gastroenterology;;   CLEDA REMOVAL  03/11/2020   Procedure: STENT REMOVAL;  Surgeon: Wilhelmenia Aloha Raddle., MD;  Location: [facility] ENDOSCOPY;  Service: Gastroenterology;;   SUBMUCOSAL LIFTING INJECTION  09/12/2018   Procedure: SUBMUCOSAL LIFTING INJECTION;  Surgeon: Wilhelmenia Aloha Raddle., MD;  Location: [facility] ENDOSCOPY;  Service: Gastroenterology;;   Family History  Family History  Problem Relation Age of Onset   Melanoma Mother    Stroke Father    Hypertension Father    Coronary artery disease Other    Colon cancer Neg Hx    Esophageal cancer Neg Hx    Stomach cancer Neg Hx    Rectal cancer Neg Hx    Pancreatic cancer Neg Hx    Liver disease Neg Hx    Inflammatory bowel disease Neg Hx    Social History  reports that he quit smoking about 47 years ago. His smoking use included cigarettes. He started smoking about 52 years ago. He has a 2.5 pack-year smoking history. He has never used smokeless tobacco. He reports that he does not drink alcohol and does not use drugs. Allergies No Known Allergies Home medications Prior to Admission medications   Medication Sig Start Date End Date  Taking? Authorizing Provider  acetaminophen  (TYLENOL ) 500 MG tablet Take 1,000 mg by mouth daily as needed for moderate pain (pain score 4-6) or mild pain (pain score 1-3).   Yes [provider]  albuterol  (VENTOLIN  HFA) 108 (90 Base) MCG/ACT inhaler Inhale 2 puffs into the lungs every 6 (six) hours as needed for wheezing or shortness of breath. 08/11/23  Yes Hunsucker, Donnice SAUNDERS, MD  amLODipine  (NORVASC ) 5 MG tablet TAKE 1 TABLET BY MOUTH DAILY 08/23/23  Yes Theophilus Andrews, Tully GRADE, MD  aspirin  EC 81 MG tablet Take 81 mg by mouth every evening. Swallow whole.   Yes [provider]  clopidogrel  (PLAVIX ) 75 MG tablet TAKE 1 TABLET BY MOUTH ONCE  DAILY 03/11/23  Yes Burnard Debby LABOR, MD  Coenzyme Q10 (CO Q-10) 300 MG CAPS Take 300 mg by mouth every evening.   Yes [provider]  cyanocobalamin  (VITAMIN B12) 1000 MCG/ML injection Inject 1 mL (1,000 mcg total) into the muscle every 30 (thirty) days. 07/20/23  Yes Theophilus Andrews Tully GRADE, MD  ezetimibe  (ZETIA ) 10 MG tablet TAKE  1 TABLET BY MOUTH DAILY 07/19/23  Yes Theophilus Andrews, Tully GRADE, MD  ferrous sulfate 325 (65 FE) MG EC tablet Take 325 mg by mouth daily.   Yes [provider]  fluticasone  (FLONASE ) 50 MCG/ACT nasal spray Place 1 spray into both nostrils daily.   Yes [provider]  Fluticasone -Umeclidin-Vilant (TRELEGY ELLIPTA ) 200-62.5-25 MCG/ACT AEPB Inhale 1 puff into the lungs daily. 08/03/23  Yes Hunsucker, Donnice SAUNDERS, MD  isosorbide  mononitrate (IMDUR ) 60 MG 24 hr tablet TAKE 1 TABLET BY MOUTH DAILY 03/11/23  Yes Burnard Debby LABOR, MD  Menthol, Topical Analgesic, (BENGAY EX) Apply 1 application topically daily as needed (pain).   Yes [provider]  Multiple Vitamin (MULTIVITAMIN) tablet Take 1 tablet by mouth daily.   Yes [provider]  nebivolol  (BYSTOLIC ) 10 MG tablet TAKE 1 TABLET BY MOUTH DAILY 05/11/23  Yes Burnard Debby LABOR, MD  nitroGLYCERIN  (NITROSTAT ) 0.4 MG SL tablet  PLACE 1 TABLET UNDER THE TONGUE EVERY 5 MINUTES AS NEEDED FOR CHEST PAIN 07/20/23  Yes Theophilus Andrews, Tully GRADE, MD  oxyCODONE -acetaminophen  (PERCOCET) 10-325 MG tablet Take 1 tablet by mouth 2 (two) times daily. Patient taking differently: Take 0.5 tablets by mouth 3 (three) times daily. 05/12/23  Yes Theophilus Andrews, Tully GRADE, MD  pantoprazole  (PROTONIX ) 40 MG tablet Take 1 tablet (40 mg total) by mouth daily. Patient taking differently: Take 40 mg by mouth every evening. 08/17/23  Yes Craig Alan SAUNDERS, PA-C  prednisoLONE acetate (PRED FORTE) 1 % ophthalmic suspension Place 1 drop into both eyes 4 (four) times daily.   Yes [provider]  rOPINIRole  (REQUIP ) 2 MG tablet TAKE 1 TABLET BY MOUTH AT  BEDTIME Patient taking differently: Take 2 mg by mouth every evening. 05/24/23  Yes Theophilus Andrews, Tully GRADE, MD  rosuvastatin  (CRESTOR ) 10 MG tablet TAKE 1 TABLET BY MOUTH DAILY 05/11/23  Yes Burnard Debby LABOR, MD  saccharomyces boulardii (FLORASTOR) 250 MG capsule Take 250 mg by mouth every evening.   Yes [provider]  sodium zirconium cyclosilicate  (LOKELMA ) 5 g packet Take 5 g by mouth 2 (two) times daily.   Yes [provider]  venlafaxine  (EFFEXOR ) 100 MG tablet Take 1 tablet (100 mg total) by mouth daily. 07/20/23  Yes Theophilus Andrews, Tully GRADE, MD  cyclobenzaprine  (FLEXERIL ) 5 MG tablet Take 1 tablet (5 mg total) by mouth at bedtime as needed for muscle spasms. Patient not taking: Reported on 09/08/2023 07/20/23   Theophilus Andrews, Tully GRADE, MD  LOKELMA  10 g PACK packet Take 1 packet by mouth 2 (two) times daily. Patient not taking: Reported on 09/08/2023 09/01/23   [provider]  venlafaxine  (EFFEXOR ) 75 MG tablet Take 150 mg by mouth daily. Patient not taking: Reported on 08/11/2023    [provider]     Vitals:   09/12/23 0349 09/12/23 0500 09/12/23 0730 09/12/23 1126  BP: 123/63  124/67 120/66  Pulse: 77 77 76 77  Resp: 17 19 18 19   Temp: 97.9  F (36.6 C)  97.8 F (36.6 C) 97.9 F (36.6 C)  TempSrc: Oral  Oral Oral  SpO2: 96% 97% 98% 95%  Weight:  68.4 kg    Height:       Exam Gen alert, no distress, [place] O2 No rash, cyanosis or gangrene Sclera anicteric, throat clear  No jvd or bruits, flat neck veins Chest clear bilat to bases, no rales/ wheezing RRR no MRG Abd soft ntnd no mass or ascites +bs GU deferred MS  no joint effusions or deformity Ext no LE or UE edema, no other edema Neuro is alert, Ox 3 , nf   Home bp meds: Norvasc  5 daily Bystolic  10mg  every day   Date   Creat  eGFR (ml/min) 2008- 2015  0.93- 1.20 > 60 ml/min 2016   1.29- 1.32 2017   1.17- 1.42   2018   1.29- 2.15 2019   1.61 Mar-July 2020  1.46- 2.15 Jan 2019  1.26- 1.64 40- 55 ml/min, IIIz  2021- 22  1.40- 1.62 2023   1.46- 1.47 45- 48, IIIa  Mar- dec 2024  1.54- 2.60 22- 42 ml/min, IIIb Jun - July 2025 2.53- 3.76 15- 23 ml/min, IV 09/07/23  2.65  23 ml/min  8/20   2.65   8/21   2.89 8/22   3.11 8/23   2.93 8/24   3.11  19 ml/min    BP: lowest BP's this admit were in the 105/ 65 range, most BP's here have been 110-140/55- 75.   I/O: 5.2 L in and 9.8 L UOP since admit = net neg 4.5 L Wts: was 70-72kg on admit --> down to 70 yest and 68kg today IP meds of interest:  IV doxy 8/20- 8/22 IV rocephin  8/20 - current IV lasix  40mg  1-2x per day from 8/19 to current  No acie/ arb/ nsaids/ contrast   UA: pend UNa, UCr pend  CT chest 8/19: ground-glass changes, +thickened septae c/w vol ^, also patchy multifocal opacities; L kidney seen w/ no hydro, R kidney gone CXR 8/19 - bilat patchy pulm edema CXR 8/22 - still pathcy pulm edema  Labs: Na 139  K 4.0, BUN 63, creat 3.11  Assessment/ Plan: AKI on CKD 4: b/l creat 2.5-2.7 from July- aug 2025, eGFR 23 ml/min. H/o R nephrectomy in may 2024. Is f/b [facility]. Creat here was 2.6 on admission, now is up to 2.9- 3.1 after admit for SOB w/ pulm edema and MF pna, treated w/ IV abx and daily IV lasix  40  mg 1-2 x per day. Has diuresed and is down 4-5 kg, breathing better but still on [place] O2. Never had LE edema per pt. Bump in creat w/ diuresis to 2.9- 3.1 may be just approaching euvolemia/new baseline creatinine for him. Only other item may or may not be contributing is his BP's are on the lower side of normal now, will d/w pmd about possibly lowering BB dosing in case this is effecting renal perfusion. Getting IV lasix  40mg  x 1 today per cardiology, agree. Will get another f/u CXR. Will follow.  SOB: w/ pulm edema and multifocal PNA. Improved w/ abx and IV lasix  as above.  CAD hx CABG Acute HFrEF: with EF 40-45% HTN: on BB for CHF, atrial flutter      Myer Fret  MD [facility] 09/12/2023, 2:34 PM  Recent Labs  Lab 09/11/23 0229 09/12/23 0256  CREATININE 2.93* 3.11*  K 3.8 4.0   Inpatient medications:  apixaban   2.5 mg Oral BID   budesonide -glycopyrrolate -formoterol   2 puff Inhalation BID   [START ON 09/13/2023] clopidogrel   75 mg Oral Daily   docusate sodium   100 mg Oral BID   doxycycline   100 mg Oral Q12H   ezetimibe   10 mg Oral Daily   isosorbide  mononitrate  30 mg Oral Daily   mupirocin  ointment  1 Application Nasal BID   nebivolol   10 mg Oral Daily   pantoprazole   40 mg Oral Daily   rOPINIRole   2 mg Oral QHS  rosuvastatin   10 mg Oral Daily   sodium chloride  flush  3 mL Intravenous Q12H   venlafaxine   100 mg Oral Daily    cefTRIAXone  (ROCEPHIN )  IV Stopped (09/11/23 2134)   acetaminophen  **OR** acetaminophen , ipratropium-albuterol , nitroGLYCERIN , ondansetron  **OR** ondansetron  (ZOFRAN ) IV, oxyCODONE -acetaminophen  **AND** oxyCODONE 

## 2023-09-13 DIAGNOSIS — I509 Heart failure, unspecified: Secondary | ICD-10-CM | POA: Diagnosis not present

## 2023-09-13 DIAGNOSIS — I483 Typical atrial flutter: Secondary | ICD-10-CM

## 2023-09-13 LAB — CBC
HCT: 31 % — ABNORMAL LOW (ref 39.0–52.0)
Hemoglobin: 9.9 g/dL — ABNORMAL LOW (ref 13.0–17.0)
MCH: 28.1 pg (ref 26.0–34.0)
MCHC: 31.9 g/dL (ref 30.0–36.0)
MCV: 88.1 fL (ref 80.0–100.0)
Platelets: 228 K/uL (ref 150–400)
RBC: 3.52 MIL/uL — ABNORMAL LOW (ref 4.22–5.81)
RDW: 14.2 % (ref 11.5–15.5)
WBC: 8.1 K/uL (ref 4.0–10.5)
nRBC: 0 % (ref 0.0–0.2)

## 2023-09-13 LAB — BASIC METABOLIC PANEL WITH GFR
Anion gap: 12 (ref 5–15)
BUN: 61 mg/dL — ABNORMAL HIGH (ref 8–23)
CO2: 28 mmol/L (ref 22–32)
Calcium: 8.7 mg/dL — ABNORMAL LOW (ref 8.9–10.3)
Chloride: 101 mmol/L (ref 98–111)
Creatinine, Ser: 2.92 mg/dL — ABNORMAL HIGH (ref 0.61–1.24)
GFR, Estimated: 21 mL/min — ABNORMAL LOW (ref >60)
Glucose, Bld: 122 mg/dL — ABNORMAL HIGH (ref 70–99)
Potassium: 3.9 mmol/L (ref 3.5–5.1)
Sodium: 141 mmol/L (ref 135–145)

## 2023-09-13 LAB — CULTURE, BLOOD (ROUTINE X 2)
Culture: NO GROWTH
Culture: NO GROWTH
Special Requests: ADEQUATE

## 2023-09-13 MED ORDER — ALUM & MAG HYDROXIDE-SIMETH 200-200-20 MG/5ML PO SUSP
30.0000 mL | Freq: Once | ORAL | Status: AC
  Administered 2023-09-13: 30 mL via ORAL
  Filled 2023-09-13: qty 30

## 2023-09-13 MED ORDER — ALUM & MAG HYDROXIDE-SIMETH 200-200-20 MG/5ML PO SUSP
30.0000 mL | ORAL | Status: DC | PRN
  Administered 2023-09-13: 30 mL via ORAL
  Filled 2023-09-13: qty 30

## 2023-09-13 NOTE — Progress Notes (Signed)
 Progress Note  Patient Name: [name] Date of Encounter: 09/13/2023  Primary Cardiologist: Debby Sor, MD (Inactive)   Subjective   Patient seen and examined at his bedside. Sitting in bed when I arrived.   Inpatient Medications    Scheduled Meds:  apixaban   2.5 mg Oral BID   budesonide -glycopyrrolate -formoterol   2 puff Inhalation BID   clopidogrel   75 mg Oral Daily   docusate sodium   100 mg Oral BID   doxycycline   100 mg Oral Q12H   ezetimibe   10 mg Oral Daily   mupirocin  ointment  1 Application Nasal BID   nebivolol   5 mg Oral Daily   pantoprazole   40 mg Oral Daily   rOPINIRole   2 mg Oral QHS   rosuvastatin   10 mg Oral Daily   sodium chloride  flush  3 mL Intravenous Q12H   venlafaxine   100 mg Oral Daily   Continuous Infusions:  cefTRIAXone  (ROCEPHIN )  IV 2 g (09/12/23 2141)   PRN Meds: acetaminophen  **OR** acetaminophen , ipratropium-albuterol , nitroGLYCERIN , ondansetron  **OR** ondansetron  (ZOFRAN ) IV, oxyCODONE -acetaminophen  **AND** oxyCODONE    Vital Signs    Vitals:   09/12/23 1921 09/12/23 2328 09/13/23 0533 09/13/23 0741  BP: 125/65 91/61 138/68 114/61  Pulse: 79 78  78  Resp: 20 18 19  (!) 24  Temp: 98.2 F (36.8 C) 98.5 F (36.9 C) 97.9 F (36.6 C) 97.8 F (36.6 C)  TempSrc: Oral Oral Oral Oral  SpO2: 98% 99% 98% 98%  Weight:   67.3 kg   Height:        Intake/Output Summary (Last 24 hours) at 09/13/2023 9191 Last data filed at 09/13/2023 0700 Gross per 24 hour  Intake 700 ml  Output 1225 ml  Net -525 ml   Filed Weights   09/11/23 0426 09/12/23 0500 09/13/23 0533  Weight: 70.3 kg 68.4 kg 67.3 kg    Telemetry    Typical atrial flutter with controlled ventricular rate - Personally Reviewed  ECG   Atrial flutter with 4:1 A-V conduction- Personally Reviewed  Physical Exam    General: Comfortable, sitting up in his bed Head: Atraumatic, normal size  Eyes: PEERLA, EOMI  Neck: Supple, normal JVD Cardiac: Normal S1, S2; RRR; no  murmurs, rubs, or gallops Lungs: Clear to auscultation bilaterally Abd: Soft, nontender, no hepatomegaly  Ext: warm, no edema Musculoskeletal: No deformities, BUE and BLE strength normal and equal Skin: Warm and dry, no rashes   Neuro: Alert and oriented to person, place, time, and situation, CNII-XII grossly intact, no focal deficits  Psych: Normal mood and affect   Labs    Chemistry Recent Labs  Lab 09/07/23 2019 09/08/23 9360 09/09/23 0227 09/10/23 0734 09/11/23 0229 09/12/23 0256 09/13/23 0239  NA 140 141 138   < > 137 139 141  K 4.4 3.7 3.6   < > 3.8 4.0 3.9  CL 102 104 98   < > 100 100 101  CO2 24 26 27    < > 26 27 28   GLUCOSE 126* 109* 106*   < > 108* 114* 122*  BUN 37* 37* 41*   < > 59* 62* 61*  CREATININE 2.65* 2.65* 2.89*   < > 2.93* 3.11* 2.92*  CALCIUM  8.6* 8.3* 8.2*   < > 8.2* 8.6* 8.7*  PROT 7.4 6.6 6.1*  --   --   --   --   ALBUMIN  4.0 2.9* 2.6*  --   --   --   --   AST 37 21 18  --   --   --   --  ALT 18 16 14   --   --   --   --   ALKPHOS 89 68 58  --   --   --   --   BILITOT 0.6 1.2 1.0  --   --   --   --   GFRNONAA 23* 23* 21*   < > 21* 19* 21*  ANIONGAP 14 11 13    < > 11 12 12    < > = values in this interval not displayed.     Hematology Recent Labs  Lab 09/11/23 0229 09/12/23 0256 09/13/23 0239  WBC 9.7 7.7 8.1  RBC 3.07* 3.32* 3.52*  HGB 8.8* 9.5* 9.9*  HCT 26.8* 29.6* 31.0*  MCV 87.3 89.2 88.1  MCH 28.7 28.6 28.1  MCHC 32.8 32.1 31.9  RDW 14.5 14.3 14.2  PLT 188 191 228    Cardiac EnzymesNo results for input(s): TROPONINI in the last 168 hours. No results for input(s): TROPIPOC in the last 168 hours.   BNP Recent Labs  Lab 09/07/23 2019  PROBNP 13,045.0*     DDimer No results for input(s): DDIMER in the last 168 hours.   Radiology    DG CHEST PORT 1 VIEW Result Date: 09/12/2023 CLINICAL DATA:  Follow-up exam.  Pulmonary edema. EXAM: PORTABLE CHEST 1 VIEW COMPARISON:  09/10/2023. FINDINGS: The heart size and  mediastinal contours are stable. There is atherosclerotic calcification of the aorta. There is improved aeration of the lungs bilaterally with mild residual airspace disease at the left lung base. There is a small left pleural effusion. No pneumothorax is seen. Sternotomy wires and cervical spinal fusion hardware are noted. No acute osseous abnormality. IMPRESSION: Improved aeration of the lungs bilaterally. Small residual left pleural effusion with atelectasis, edema, or infiltrate at the left lung base. Electronically Signed   By: Leita Birmingham M.D.   On: 09/12/2023 18:32    Cardiac Studies  Echo   Patient Profile     83 y.o. male  with a hx of CAD s/p CABG x 4 in 2001, DES x 3 overlapping in 2020, hypertension hyperlipidemia, OSA not on CPAP, DM2, bladder cancer, right renal cell carcinoma, COPD, and GERD   Assessment & Plan    Atrial flutter  Chronic anticoagulation  CAD s/p CABG x 4 Hx of Cardiomyopathy- HFimEF  He is is still in atrial flutter - will benefit from DCCV, but the problem is his hx of esophageal stricture prohibits moving to TEE DCCV which would be the appropriate was to proceed if need rhythm control prior to 3 weeks. Otherwise it will be best to wait for 3 weeks of anticoagulation prior to cardioversion.   Now on Nebivolol  and Eliquis   Agree with stopping aspirin . Continue Plavix . No reports of anginal symptoms.   Clinically he does not appear to be significantly volume overloaded.  He has gotten some IV Lasix .  His creatinine today is trending down 2.92 was 3.11 yesterday.  Will hold off on IV Lasix  for now and reassess tomorrow.  Plan to resume Lasix  tomorrow if creatinine continue to trend down.  For questions or updates, please contact [facility] Please consult [email or web address] for contact info under Cardiology/STEMI.      Signed, Chrystina Naff, DO  09/13/2023, 8:08 AM

## 2023-09-13 NOTE — Progress Notes (Signed)
 Mobility Specialist Progress Note;   09/13/23 0933  Mobility  Activity Ambulated with assistance  Level of Assistance Standby assist, set-up cues, supervision of patient - no hands on  Assistive Device Front wheel walker  Distance Ambulated (ft) 350 ft  Activity Response Tolerated well  Mobility Referral Yes  Mobility visit 1 Mobility  Mobility Specialist Start Time (ACUTE ONLY) 0933  Mobility Specialist Stop Time (ACUTE ONLY) 0947  Mobility Specialist Time Calculation (min) (ACUTE ONLY) 14 min   Pt eager for mobility. Required no physical assistance during ambulation, SV. Ambulated on 1LO2. VSS throughout and no c/o SOB when asked. Requested to sit up on EoB once returned to room. Pt left with all needs met, call bell in reach.   Lauraine Erm Mobility Specialist Please contact via SecureChat or [employer] [phone]

## 2023-09-13 NOTE — Progress Notes (Signed)
 Nephrology Follow-Up Consult note   Assessment/Recommendations: [name] is a/an 83 y.o. male with a past medical history significant for CAD hx CABG, bladder cancer and renal cell cancer s/p R nephrectomy, CKD, HTN , admitted for SOB, sepsis 2/2 PNA.      Non-Oliguric AKI (Improved): Likely secondary to Sepsis c/b solitary kidney -Chart reviewed: (medications acceptable, does not appear to have been exposed to nephrotoxins with imaging or hypotensive x1 overnight).   -Continue to monitor daily Cr, Dose meds for GFR -Monitor Daily I/Os, Daily weight  -Maintain MAP>65 for optimal renal perfusion.  -Avoid nephrotoxic medications including NSAIDs -Use synthetic opioids (Fentanyl /Dilaudid ) if needed -Currently no indication for HD  Volume Status: Appears euvolemic on exam. Based on our examination and review of available imaging, our recommendation is continue supportive care.  Hypertension: Hypotensive overnight.  Monitor.  Medical Problem: CAP Sepsis: On IV Abx Acute HFrEF: On BB Aflutter: on BB, Eliquis  CAD: On BB, Imdur , Eliquis    Recommendations conveyed to primary service.    [name]  [facility] 09/13/2023 7:02 AM  ___________________________________________________________  CC: SOB  Interval History/Subjective: No major events reported overnight.  Seen in bed.  Feels good.  Creatinine slightly proved.  Remains nonoliguric   Medications:  Current Facility-Administered Medications  Medication Dose Route Frequency Provider Last Rate Last Admin   acetaminophen  (TYLENOL ) tablet 650 mg  650 mg Oral Q6H PRN Sundil, Subrina, MD       Or   acetaminophen  (TYLENOL ) suppository 650 mg  650 mg Rectal Q6H PRN Sundil, Subrina, MD       apixaban  (ELIQUIS ) tablet 2.5 mg  2.5 mg Oral BID Jonel Lonni SQUIBB, MD   2.5 mg at 09/12/23 2137   budesonide -glycopyrrolate -formoterol  (BREZTRI ) 160-9-4.8 MCG/ACT inhaler 2 puff  2 puff Inhalation BID Sundil,  Subrina, MD   2 puff at 09/12/23 1919   cefTRIAXone  (ROCEPHIN ) 2 g in sodium chloride  0.9 % 100 mL IVPB  2 g Intravenous Q24H Sundil, Subrina, MD 200 mL/hr at 09/12/23 2141 2 g at 09/12/23 2141   clopidogrel  (PLAVIX ) tablet 75 mg  75 mg Oral Daily Alvan Dorn FALCON, MD       docusate sodium  (COLACE) capsule 100 mg  100 mg Oral BID Sundil, Subrina, MD   100 mg at 09/10/23 9043   doxycycline  (VIBRA -TABS) tablet 100 mg  100 mg Oral Q12H Jonel Lonni SQUIBB, MD   100 mg at 09/12/23 2137   ezetimibe  (ZETIA ) tablet 10 mg  10 mg Oral Daily Sundil, Subrina, MD   10 mg at 09/12/23 9164   ipratropium-albuterol  (DUONEB) 0.5-2.5 (3) MG/3ML nebulizer solution 3 mL  3 mL Nebulization Q6H PRN Lee, Subrina, MD       mupirocin  ointment (BACTROBAN ) 2 % 1 Application  1 Application Nasal BID Jonel Lonni SQUIBB, MD   1 Application at 09/12/23 2138   nebivolol  (BYSTOLIC ) tablet 5 mg  5 mg Oral Daily Danford, Lonni SQUIBB, MD       nitroGLYCERIN  (NITROSTAT ) SL tablet 0.4 mg  0.4 mg Sublingual Q5 Min x 3 PRN Sundil, Subrina, MD       ondansetron  (ZOFRAN ) tablet 4 mg  4 mg Oral Q6H PRN Sundil, Subrina, MD       Or   ondansetron  (ZOFRAN ) injection 4 mg  4 mg Intravenous Q6H PRN Sundil, Subrina, MD       oxyCODONE -acetaminophen  (PERCOCET/ROXICET) 5-325 MG per tablet 1 tablet  1 tablet Oral Q8H PRN Danford, Lonni SQUIBB, MD   1 tablet  at 09/12/23 2306   And   oxyCODONE  (Oxy IR/ROXICODONE ) immediate release tablet 5 mg  5 mg Oral Q8H PRN Jonel Lonni SQUIBB, MD   5 mg at 09/12/23 0835   pantoprazole  (PROTONIX ) EC tablet 40 mg  40 mg Oral Daily Sundil, Subrina, MD   40 mg at 09/12/23 0835   rOPINIRole  (REQUIP ) tablet 2 mg  2 mg Oral QHS Sundil, Subrina, MD   2 mg at 09/12/23 2137   rosuvastatin  (CRESTOR ) tablet 10 mg  10 mg Oral Daily Sundil, Subrina, MD   10 mg at 09/12/23 9165   sodium chloride  flush (NS) 0.9 % injection 3 mL  3 mL Intravenous Q12H Sundil, Subrina, MD   3 mL at 09/11/23 2059   venlafaxine   (EFFEXOR ) tablet 100 mg  100 mg Oral Daily Sundil, Subrina, MD   100 mg at 09/12/23 9165      Review of Systems: 10 systems reviewed and negative except per interval history/subjective  Physical Exam: Vitals:   09/12/23 2328 09/13/23 0533  BP: 91/61 138/68  Pulse: 78   Resp: 18 19  Temp: 98.5 F (36.9 C) 97.9 F (36.6 C)  SpO2: 99% 98%   No intake/output data recorded.  Intake/Output Summary (Last 24 hours) at 09/13/2023 0702 Last data filed at 09/13/2023 0536 Gross per 24 hour  Intake 700 ml  Output 1025 ml  Net -325 ml   Constitutional: well-appearing, no acute distress ENMT: ears and nose without scars or lesions, MMM CV: normal rate, no edema Respiratory: clear to auscultation, normal work of breathing.  On RA Gastrointestinal: soft, non-tender, no palpable masses or hernias Skin: no visible lesions or rashes Psych: alert, judgement/insight appropriate, appropriate mood and affect No Foley   Test Results I personally reviewed new and old clinical labs and radiology tests Lab Results  Component Value Date   NA 141 09/13/2023   K 3.9 09/13/2023   CL 101 09/13/2023   CO2 28 09/13/2023   BUN 61 (H) 09/13/2023   CREATININE 2.92 (H) 09/13/2023   GFR 23.23 (L) 07/20/2023   GLU 103 08/18/2023   CALCIUM  8.7 (L) 09/13/2023   ALBUMIN  2.6 (L) 09/09/2023    CBC Recent Labs  Lab 09/07/23 2019 09/08/23 0639 09/11/23 0229 09/12/23 0256 09/13/23 0239  WBC 15.2*   < > 9.7 7.7 8.1  NEUTROABS 11.8*  --   --   --   --   HGB 10.6*   < > 8.8* 9.5* 9.9*  HCT 32.8*   < > 26.8* 29.6* 31.0*  MCV 88.9   < > 87.3 89.2 88.1  PLT 203   < > 188 191 228   < > = values in this interval not displayed.

## 2023-09-13 NOTE — TOC Progression Note (Signed)
 Transition of Care (TOC) - Progression Note   Received secure chat from PT , recommending Rollator and OP PT.   NCM spoke to patient and Leeroy at bedside. Offered choice. Leeroy would like OP PT at [facility]. Referral entered asked MD to sign  Ordered Rollator also asked MD to sign. Ordered with Mitch with [facility]  Patient Details  Name: [name] MRN: [id number] Date of Birth: [birth year]  Transition of Care [place]) CM/SW Contact  Clair Alfieri, Powell Jansky, RN Phone Number: 09/13/2023, 1:13 PM  Clinical Narrative:       Expected Discharge Plan: Home/Self Care Barriers to Discharge: Continued Medical Work up               Expected Discharge Plan and Services       Living arrangements for the past 2 months: Single Family Home                                       Social Drivers of Health (SDOH) Interventions SDOH Screenings   Food Insecurity: No Food Insecurity (07/12/2023)  Housing: Unknown (09/09/2023)  Transportation Needs: No Transportation Needs (07/12/2023)  Utilities: Not At Risk (07/12/2023)  Alcohol Screen: Low Risk  (09/09/2023)  Depression (PHQ2-9): High Risk (07/20/2023)  Financial Resource Strain: Low Risk  (09/09/2023)  Physical Activity: Unknown (02/10/2023)  Recent Concern: Physical Activity - Inactive (02/10/2023)  Social Connections: Moderately Integrated (07/08/2023)  Stress: No Stress Concern Present (02/10/2023)  Tobacco Use: Medium Risk (09/08/2023)    Readmission Risk Interventions    07/08/2023    9:48 AM  Readmission Risk Prevention Plan  Transportation Screening Complete  HRI or Home Care Consult Complete  Social Work Consult for Recovery Care Planning/Counseling Complete  Palliative Care Screening Not Applicable  Medication Review Oceanographer) Complete

## 2023-09-13 NOTE — Progress Notes (Signed)
 Physical Therapy Treatment Patient Details Name: [name] MRN: [id number] DOB: June 24, 1940 Today's Date: 09/13/2023   History of Present Illness 83 y.o. M who presented 8/19 with SOB, found to have CHF flare and pneumonia. PMH:CAD s/p CABG, COPD not on home O2, HTN, RCC s/p nephrectomy, CKD IV.    PT Comments  Progressing well towards acute functional goals. Pt able to ambulate safely with rollator today >150 feet, SpO2 90% and greater on room air. Find rollator to be very helpful. Practiced navigating steps similar to his home set-up. Feels confident he can manage, daughter present and observed during session today, can assist upon d/c home and able to take pt to OPPT visit to further improve his functional abilities. Patient will continue to benefit from skilled physical therapy services to further improve independence with functional mobility.     If plan is discharge home, recommend the following: Assistance with cooking/housework;Assist for transportation;Help with stairs or ramp for entrance   Can travel by private vehicle        Equipment Recommendations  Rollator (4 wheels)    Recommendations for Other Services       Precautions / Restrictions Precautions Precautions: Fall Recall of Precautions/Restrictions: Intact Precaution/Restrictions Comments: Monitor O2 Restrictions Weight Bearing Restrictions Per Provider Order: No     Mobility  Bed Mobility Overal bed mobility: Modified Independent             General bed mobility comments: Extra time no assist.    Transfers Overall transfer level: Needs assistance Equipment used: Rollator (4 wheels), None Transfers: Sit to/from Stand Sit to Stand: Supervision           General transfer comment: Supervision for safety, practiced with and without AD. With rollator, educated on set-up (including break application) and practiced from bed x 2 and from rollator seat in hallway (braced against wall.)     Ambulation/Gait Ambulation/Gait assistance: Supervision Gait Distance (Feet): 180 Feet Assistive device: Rollator (4 wheels) Gait Pattern/deviations: Step-through pattern, Decreased stride length, Trunk flexed Gait velocity: dec Gait velocity interpretation: <1.8 ft/sec, indicate of risk for recurrent falls   General Gait Details: Cues for upright posture, brake use on rollator, and safety awareness. SpO2 90% and greater on RA when good waveform present monitor. No overt buckling or LOB with this device. Finds it to be very helpful.   Stairs Stairs: Yes Stairs assistance: Contact guard assist Stair Management: No rails, One rail Right, Step to pattern, Forwards, With walker Number of Stairs: 1 (x4) General stair comments: Practiced navigating steps with similar set-up to home to simulate. Pt able to complete at [facility] level for safety with rollator, and single rail to immulate column he normally uses. Daughter will be present to supervise when he returns home. Practiced several technique and pt feels he can safely complete on his own steps at home.   Wheelchair Mobility     Tilt Bed    Modified Rankin (Stroke Patients Only)       Balance Overall balance assessment: Needs assistance Sitting-balance support: No upper extremity supported, Feet supported Sitting balance-Leahy Scale: Normal     Standing balance support: No upper extremity supported Standing balance-Leahy Scale: Fair                              Hotel manager: No apparent difficulties  Cognition Arousal: Alert Behavior During Therapy: WFL for tasks assessed/performed   PT - Cognitive impairments: No apparent  impairments                         Following commands: Intact      Cueing Cueing Techniques: Verbal cues  Exercises      General Comments General comments (skin integrity, edema, etc.): Spo2 92% on RA.      Pertinent Vitals/Pain Pain  Assessment Pain Assessment: No/denies pain    Home Living                          Prior Function            PT Goals (current goals can now be found in the care plan section) Acute Rehab PT Goals Patient Stated Goal: Go home PT Goal Formulation: With patient Time For Goal Achievement: 09/24/23 Potential to Achieve Goals: Good Progress towards PT goals: Progressing toward goals    Frequency    Min 2X/week      PT Plan      Co-evaluation              AM-PAC PT 6 Clicks Mobility   Outcome Measure  Help needed turning from your back to your side while in a flat bed without using bedrails?: None Help needed moving from lying on your back to sitting on the side of a flat bed without using bedrails?: None Help needed moving to and from a bed to a chair (including a wheelchair)?: A Little Help needed standing up from a chair using your arms (e.g., wheelchair or bedside chair)?: A Little Help needed to walk in hospital room?: A Little Help needed climbing 3-5 steps with a railing? : A Little 6 Click Score: 20    End of Session   Activity Tolerance: Patient tolerated treatment well Patient left: with call bell/phone within reach;in bed;with bed alarm set;with family/visitor present Nurse Communication: Mobility status PT Visit Diagnosis: Unsteadiness on feet (R26.81);Other abnormalities of gait and mobility (R26.89);Muscle weakness (generalized) (M62.81);Difficulty in walking, not elsewhere classified (R26.2)     Time: 8787-8758 PT Time Calculation (min) (ACUTE ONLY): 29 min  Charges:    $Gait Training: 8-22 mins $Therapeutic Activity: 8-22 mins PT General Charges $$ ACUTE PT VISIT: 1 Visit                     Leontine Roads, PT, DPT [facility]  Rehabilitation Services Physical Therapist Office: [phone] Website: [email or web address]    Leontine GORMAN Roads 09/13/2023, 2:08 PM

## 2023-09-13 NOTE — Care Management Important Message (Signed)
 Important Message  Patient Details  Name: [name] MRN: [id number] Date of Birth: 1940/02/11   Important Message Given:  Yes - Medicare IM   IM Given on 8/23/02025  Claretta Deed 09/13/2023, 8:27 AM

## 2023-09-13 NOTE — Progress Notes (Addendum)
  Progress Note   Patient: [name] FMW:[id number] DOB: Jul 07, 1940 DOA: 09/07/2023     5 DOS: the patient was seen and examined on 09/13/2023 at 9:48AM      Brief hospital course: 83 y.o. M with CAD s/p CABG, COPD not on home O2, HTN, RCC s/p nephrectomy, CKD IV baseline 2.8 who presented with SOB, fever 103F, found to have pneumonia, CHF flare, and new onset Aflutter as well as abnormal chest CT.     Assessment and Plan: Community-acquired pneumonia sepsis Acute respiratory failure with hypoxia - Continue Rocephin  and doxycycline  day 6 of 7 -Pulmonary toilet   Acute congestive heart failure with mildly reduced ejection fraction See note from 8/24 - Continue Nebivolol , reduced dose  New onset atrial flutter Stable - Continue Nebivolol , Eliquis   Coronary artery disease S/p CABG 2001 - Consult Cardiology, appreciate cares - Continue Eliquis  - Continue Plavix  - Continue Nebivolol , reduced dose Imdur , Zetia , Crestor  - Hold amlodipine   Abnormal chest CT He has some atypical consolidations, loculated effusion, malignancy not completely ruled out.  Will need outpatient pulmonology follow-up.  This was discussed with pulmonology on admission.  Chronic kidney disease stage IV Creatinine back up to 3.1 today with a dose of Lasix  yesterday -Consult nephrology for optimization in case right heart cath needed  Chronic pain syndrome -Continue home Percocet  COPD No wheezing here to suggest flare - Continue Breztri   Mood disorder -Continue venlafaxine   Diabetes Diet controlled, glucose normal here  Anemia of chronic kidney disease No clinical bleeding reported or observed, overall hemoglobin has remained stable         Subjective: Weaned off O2.  No new fever, overall feeling better.  Nephrology and Cardiology have seen, want to monitor Cr one more day.     Physical Exam: BP 106/67 (BP Location: Right Arm)   Pulse 83   Temp 97.8 F (36.6 C) (Oral)    Resp 15   Ht 5' 9 (1.753 m)   Wt 67.3 kg   SpO2 94%   BMI 21.91 kg/m   Thin adult male, lying in bed, pleasant and interactive but weak RRR, I do not appreciate systolic murmurs, neck veins elevated above the clavicle, essentially unchanged Respiratory rate seems normal, at rest, lung sounds diminished at bases, some crackles still present but improved from yesterday Abdomen soft, no tenderness palpation, no ascites or distention Attention normal, short-term memory appears impaired but is at baseline, he has symmetric strength 5/5 in upper and lower extremities bilaterally    Data Reviewed: Discussed with nephrology Basic metabolic panel shows stable creatinine CBC unremarkable    Family Communication: Called daughter, left VM    Disposition: Status is: Inpatient         Author: Lonni SHAUNNA Dalton, MD 09/13/2023 6:24 PM  For on call review [email or web address].

## 2023-09-13 NOTE — Plan of Care (Signed)

## 2023-09-14 ENCOUNTER — Other Ambulatory Visit (HOSPITAL_COMMUNITY): Payer: Self-pay

## 2023-09-14 DIAGNOSIS — I5033 Acute on chronic diastolic (congestive) heart failure: Secondary | ICD-10-CM | POA: Diagnosis not present

## 2023-09-14 DIAGNOSIS — F32A Depression, unspecified: Secondary | ICD-10-CM

## 2023-09-14 DIAGNOSIS — G894 Chronic pain syndrome: Secondary | ICD-10-CM | POA: Diagnosis not present

## 2023-09-14 DIAGNOSIS — N184 Chronic kidney disease, stage 4 (severe): Secondary | ICD-10-CM | POA: Diagnosis not present

## 2023-09-14 DIAGNOSIS — J189 Pneumonia, unspecified organism: Secondary | ICD-10-CM | POA: Diagnosis not present

## 2023-09-14 LAB — BASIC METABOLIC PANEL WITH GFR
Anion gap: 14 (ref 5–15)
BUN: 58 mg/dL — ABNORMAL HIGH (ref 8–23)
CO2: 28 mmol/L (ref 22–32)
Calcium: 8.9 mg/dL (ref 8.9–10.3)
Chloride: 99 mmol/L (ref 98–111)
Creatinine, Ser: 2.78 mg/dL — ABNORMAL HIGH (ref 0.61–1.24)
GFR, Estimated: 22 mL/min — ABNORMAL LOW (ref >60)
Glucose, Bld: 107 mg/dL — ABNORMAL HIGH (ref 70–99)
Potassium: 4.2 mmol/L (ref 3.5–5.1)
Sodium: 141 mmol/L (ref 135–145)

## 2023-09-14 LAB — CBC
HCT: 32.4 % — ABNORMAL LOW (ref 39.0–52.0)
Hemoglobin: 10.4 g/dL — ABNORMAL LOW (ref 13.0–17.0)
MCH: 28.2 pg (ref 26.0–34.0)
MCHC: 32.1 g/dL (ref 30.0–36.0)
MCV: 87.8 fL (ref 80.0–100.0)
Platelets: 230 K/uL (ref 150–400)
RBC: 3.69 MIL/uL — ABNORMAL LOW (ref 4.22–5.81)
RDW: 14.2 % (ref 11.5–15.5)
WBC: 8.9 K/uL (ref 4.0–10.5)
nRBC: 0 % (ref 0.0–0.2)

## 2023-09-14 MED ORDER — ISOSORBIDE MONONITRATE ER 30 MG PO TB24
30.0000 mg | ORAL_TABLET | Freq: Every day | ORAL | 0 refills | Status: DC
  Filled 2023-09-14: qty 90, 90d supply, fill #0

## 2023-09-14 MED ORDER — GERHARDT'S BUTT CREAM
TOPICAL_CREAM | Freq: Every day | CUTANEOUS | Status: DC
  Filled 2023-09-14: qty 60

## 2023-09-14 MED ORDER — FUROSEMIDE 40 MG PO TABS
40.0000 mg | ORAL_TABLET | ORAL | Status: DC
  Administered 2023-09-14: 40 mg via ORAL
  Filled 2023-09-14: qty 1

## 2023-09-14 MED ORDER — APIXABAN 2.5 MG PO TABS
2.5000 mg | ORAL_TABLET | Freq: Two times a day (BID) | ORAL | 0 refills | Status: DC
  Filled 2023-09-14: qty 180, 90d supply, fill #0

## 2023-09-14 MED ORDER — NEBIVOLOL HCL 10 MG PO TABS
5.0000 mg | ORAL_TABLET | Freq: Every day | ORAL | 0 refills | Status: DC
  Filled 2023-09-14: qty 15, 30d supply, fill #0

## 2023-09-14 MED ORDER — GERHARDT'S BUTT CREAM
1.0000 | TOPICAL_CREAM | Freq: Every day | CUTANEOUS | 0 refills | Status: AC
  Filled 2023-09-14: qty 60, 15d supply, fill #0

## 2023-09-14 MED ORDER — FUROSEMIDE 20 MG PO TABS
20.0000 mg | ORAL_TABLET | ORAL | 0 refills | Status: AC
  Filled 2023-09-14: qty 15, 30d supply, fill #0

## 2023-09-14 NOTE — Discharge Summary (Signed)
 Physician Discharge Summary   Patient: [name] MRN: [id number] DOB: December 26, 1940  Admit date:     09/07/2023  Discharge date: 09/14/23  Discharge Physician: Lonni SHAUNNA Dalton   PCP: Theophilus Andrews, Tully GRADE, MD     Recommendations at discharge:  Follow up with Pulmonology Tammy Parrett in 1 week for abnormal chest CT and resolving pneumonia Follow up with Cardiology in 1 week for new Atrial flutter and CHF flare Follow up with Nephrology Dr. Macel in 1-2 weeks for CKD, hyperkalemia Follow up with GI Dr. Wilhelmenia when able, for esophageal stenosis Please obtain BMP and CBC in 1 week (discharge Cr 2.78 mg/dL, K 4.2 mmol/L, Hgb 89.5 g/dl)     Discharge Diagnoses: Principal Problem:   Sepsis due to community-acquired pneumonia Active Problems:   Acute respiratory failure with hypoxia   Acute on chronic congestive heart failure with mildly reduced ejection fraction   New onset atrial flutter   Coronary artery disease with angina   Abnormal chest CT   Stage IV chronic kidney disease   Chronic pain syndrome   COPD   Anemia of chronic kidney disease   Essential hypertension   Diabetes mellitus type 2, noninsulin dependent (HCC)   History of renal cell cancer s/p  nephrectomy   Chronic pain syndrome     Hospital Course: 83 y.o. M with CAD s/p CABG, COPD not on home O2, HTN, RCC s/p nephrectomy, CKD IV baseline 2.8 who presented with SOB, fever 103F, found to have pneumonia, CHF flare, and new onset Aflutter as well as abnormal chest CT.     Sepsis due to community-acquired pneumonia Acute respiratory failure with hypoxia Presented with fever, tachycardia, tachypnea, leukocytosis, and respiratory failure due to multifocal pneumonia.  Patient now mentating at baseline, taking orals.  Temp < 100 F, heart rate < 100bpm, RR < 24, SpO2 at baseline.   Stable for discharge.  Completed 7 days Rocephin .      Acute congestive heart failure with mildly reduced  ejection fraction Previous EF 40 to 45%, recently recovered to 50 to 55% this admission.  No significant valvular disease.  The patient was diuresed on admission in the setting of bilateral infiltrates, pleural effusion, elevated JVP, and elevated proBNP.  Cardiology were consulted, he was diuresed to euvolemia and discharged on Lasix  every other day. - Close follow-up and renal function     New onset atrial flutter CHA2DS2-Vasc 5 (age, DM, CHF, vascular disease).  Recent TSH normal. Cardiology consulted.   Aspirin  stopped.  Eliquis  started.    Plan for outpatient re-evaluation re: DCCV    Coronary artery disease S/p CABG 2001 Plavix  continued.  Imdur  and nebivolol  reduced to improve renal perfusion given lower BP with Aflutter and worsening renal function.       Abnormal chest CT This was an incidental finding on admission CT chest: He has some atypical consolidations, loculated effusion, malignancy not completely ruled out.  Will need outpatient pulmonology follow-up.  This was arranged and pulmonology follow up is pending with Tammy Parrett on 9/9.    Chronic kidney disease stage IV Cr fluctuated between 2.6-3.1 during hospitalization.  Nebivolol  and Imdur  reduced for renal perfusion.    Needs follow up with Dr. Macel re: Lokelma  in 1-2 weeks     Chronic pain syndrome Continued on home Percocet    COPD No wheezing here to suggest flare.  Stable on LABA/LAMA/ICS   Diabetes Diet controlled, glucose normal here   Anemia of chronic kidney disease No  clinical bleeding reported or observed, overall hemoglobin has remained stable  Esophageal stenosis Towards the end of hospitalization, he had some increased difficulty swallowing, which he felt was related to hospital food, and not adhering to his typical home diet of small meals more frequently, different consistencies.            The [place]  Controlled Substances Registry was reviewed for this patient prior  to discharge.  Consultants: Cardiology Dr. Sheena Nephrology Dr. Windle    Disposition: Home Diet recommendation:  Discharge Diet Orders (From admission, onward)     Start     Ordered   09/14/23 0000  Diet - low sodium heart healthy        09/14/23 0846             DISCHARGE MEDICATION: Allergies as of 09/14/2023   No Known Allergies      Medication List     PAUSE taking these medications    amLODipine  5 MG tablet Wait to take this until your doctor or other care provider tells you to start again. Commonly known as: NORVASC  TAKE 1 TABLET BY MOUTH DAILY       STOP taking these medications    aspirin  EC 81 MG tablet       TAKE these medications    acetaminophen  500 MG tablet Commonly known as: TYLENOL  Take 1,000 mg by mouth daily as needed for moderate pain (pain score 4-6) or mild pain (pain score 1-3).   albuterol  108 (90 Base) MCG/ACT inhaler Commonly known as: VENTOLIN  HFA Inhale 2 puffs into the lungs every 6 (six) hours as needed for wheezing or shortness of breath.   BENGAY EX Apply 1 application topically daily as needed (pain).   clopidogrel  75 MG tablet Commonly known as: PLAVIX  TAKE 1 TABLET BY MOUTH ONCE  DAILY   Co Q-10 300 MG Caps Take 300 mg by mouth every evening.   cyanocobalamin  1000 MCG/ML injection Commonly known as: VITAMIN B12 Inject 1 mL (1,000 mcg total) into the muscle every 30 (thirty) days.   cyclobenzaprine  5 MG tablet Commonly known as: FLEXERIL  Take 1 tablet (5 mg total) by mouth at bedtime as needed for muscle spasms.   Eliquis  2.5 MG Tabs tablet Generic drug: apixaban  Take 1 tablet (2.5 mg total) by mouth 2 (two) times daily.   ezetimibe  10 MG tablet Commonly known as: ZETIA  TAKE 1 TABLET BY MOUTH DAILY   ferrous sulfate 325 (65 FE) MG EC tablet Take 325 mg by mouth daily.   fluticasone  50 MCG/ACT nasal spray Commonly known as: FLONASE  Place 1 spray into both nostrils daily.   furosemide  20 MG  tablet Commonly known as: LASIX  Take 1 tablet (20 mg total) by mouth every other day.   Gerhardt's butt cream Crea Apply 1 Application topically daily.   isosorbide  mononitrate 30 MG 24 hr tablet Commonly known as: IMDUR  Take 1 tablet (30 mg total) by mouth daily. What changed:  medication strength how much to take   Lokelma  5 g packet Generic drug: sodium zirconium cyclosilicate  Take 5 g by mouth 2 (two) times daily.   multivitamin tablet Take 1 tablet by mouth daily.   nebivolol  10 MG tablet Commonly known as: BYSTOLIC  Take 0.5 tablets (5 mg total) by mouth daily. What changed: how much to take   nitroGLYCERIN  0.4 MG SL tablet Commonly known as: NITROSTAT  PLACE 1 TABLET UNDER THE TONGUE EVERY 5 MINUTES AS NEEDED FOR CHEST PAIN   oxyCODONE -acetaminophen  10-325 MG tablet  Commonly known as: PERCOCET Take 1 tablet by mouth 2 (two) times daily. What changed:  how much to take when to take this   pantoprazole  40 MG tablet Commonly known as: PROTONIX  Take 1 tablet (40 mg total) by mouth daily. What changed: when to take this   prednisoLONE acetate 1 % ophthalmic suspension Commonly known as: PRED FORTE Place 1 drop into both eyes 4 (four) times daily.   rOPINIRole  2 MG tablet Commonly known as: REQUIP  TAKE 1 TABLET BY MOUTH AT  BEDTIME What changed: when to take this   rosuvastatin  10 MG tablet Commonly known as: CRESTOR  TAKE 1 TABLET BY MOUTH DAILY   saccharomyces boulardii 250 MG capsule Commonly known as: FLORASTOR Take 250 mg by mouth every evening.   Trelegy Ellipta  200-62.5-25 MCG/ACT Aepb Generic drug: Fluticasone -Umeclidin-Vilant Inhale 1 puff into the lungs daily.   venlafaxine  100 MG tablet Commonly known as: EFFEXOR  Take 1 tablet (100 mg total) by mouth daily.               Durable Medical Equipment  (From admission, onward)           Start     Ordered   09/13/23 1311  For home use only DME 4 wheeled rolling walker with seat   Once       Question:  Patient needs a walker to treat with the following condition  Answer:  Weakness   09/13/23 1310            Follow-up Information     [facility]. Go in 4 day(s).   Specialty: Cardiology Why: Hospital follow up 09/17/23 @ 10:30 am PLEASE bring a current medication list to appointment FREE valet parking, Entrance C, off [employer] [facility] entrance Contact information: [address] [place]  [phone]        [facility] Follow up.   Specialty: Rehabilitation Contact information: [address] [place], [address] [place]  [address] [phone]        Mansouraty, Aloha Raddle., MD Follow up.   Specialties: Gastroenterology, Internal Medicine Contact information: [address] [place] [address] [phone]         [facility]. Schedule an appointment as soon as possible for a visit in 1 week(s).   Why: 09/22/23 @10 :40 AM Contact information: [address] [place] [address] [phone]         [facility] Follow up.   Specialty: Pulmonology Why: 09/28/23 @10 :30AM Contact information: [address] [place]  [address] [phone] Additional information: [address]  [address]  [place], [place] [address]                Discharge Instructions     Ambulatory referral to Physical Therapy   Complete by: As directed    Iontophoresis - 4 mg/ml of dexamethasone : No   T.E.N.S. Unit Evaluation and Dispense as Indicated: No   Diet - low sodium heart healthy   Complete by: As directed    Discharge instructions   Complete by: As directed    **IMPORTANT DISCHARGE INSTRUCTIONS**   From Dr. Jonel: You were admitted for pneumonia and congestive heart failure  You had a CT of the chest  that showed a combination of pneumonia and fluid/edema  Pneumonia is typically a bacterial infection of the lungs, and  you were treated here with antibiotics Continue to use the flutter valve and incentive spirometer Resume all your home inhalers  There were also some abnormal findings on your chest CAT scan, and you should follow up with the Pulmonary clinic.  I have sent a referral and they will call you.  If you haven't heard from them in 1 week, call the phone number below for [facility]  For the congestive heart failure, you had an echocardiogram (ultrasound of the heart) that was reassuring. You were treated with diuretics and this helped.  You were also found to have atrial flutter, which is an abnormal heart rhythm, associated with strokes.  You should continue a very low dose diuretic for the heart failure You should start the blood thinner Eliquis  2.5 mg twice daily for the afib (to prevent strokes) If you notice black or tar-like or asphalt-like bowel movements, stop Eliquis  immediately and call your doctor that day  Go see the heart doctors at the date and time listed below in the To Do section  Have your labs checked in 1 week  Call Dr. Wilhelmenia to rearrange your esophageal procedure if possible  To help your kidneys, REDUCE your nebivolol  and Imdur  (reduce to 5 mg of nebivolol  daily and 30 mg Imdur  daily) STOP amlodipine  until you see your doctor  Now that you are on ELiquis , STOP aspirin , and don't take this anymore   Follow up appointments needed: Cardiology Nephrology/kidney Pulmonology Gastroenterology   Increase activity slowly   Complete by: As directed        Discharge Exam: Filed Weights   09/12/23 0500 09/13/23 0533 09/14/23 0625  Weight: 68.4 kg 67.3 kg 66.7 kg    General: Pt is alert, awake, not in acute distress Cardiovascular: RRR, nl S1-S2, no murmurs appreciated.   No LE edema.   Respiratory: Normal respiratory rate and  rhythm.  CTAB without rales or wheezes. Abdominal: Abdomen soft and non-tender.  No distension or HSM.   Neuro/Psych: Strength symmetric in upper and lower extremities.  Judgment and insight appear slightly impaired but at baseline.   Condition at discharge: good  The results of significant diagnostics from this hospitalization (including imaging, microbiology, ancillary and laboratory) are listed below for reference.   Imaging Studies: DG CHEST PORT 1 VIEW Result Date: 09/12/2023 CLINICAL DATA:  Follow-up exam.  Pulmonary edema. EXAM: PORTABLE CHEST 1 VIEW COMPARISON:  09/10/2023. FINDINGS: The heart size and mediastinal contours are stable. There is atherosclerotic calcification of the aorta. There is improved aeration of the lungs bilaterally with mild residual airspace disease at the left lung base. There is a small left pleural effusion. No pneumothorax is seen. Sternotomy wires and cervical spinal fusion hardware are noted. No acute osseous abnormality. IMPRESSION: Improved aeration of the lungs bilaterally. Small residual left pleural effusion with atelectasis, edema, or infiltrate at the left lung base. Electronically Signed   By: Leita Birmingham M.D.   On: 09/12/2023 18:32   DG Chest 2 View Result Date: 09/10/2023 CLINICAL DATA:  200808 Hypoxia [id number] EXAM: CHEST - 2 VIEW COMPARISON:  09/07/2023. FINDINGS: Stable cardiomediastinal contours. Prior median sternotomy and CABG. Low lung volumes. Similar small bilateral pleural effusions. Similar bilateral interstitial prominence, suggestive of pulmonary edema, with patchy bilateral airspace opacities, more pronounced on the right. Visualized osseous structures are unchanged. IMPRESSION: 1. Small bilateral pleural effusions, not significantly changed. 2. Similar bilateral interstitial prominence, suggestive of pulmonary edema, with patchy bilateral airspace opacities, more pronounced on the right. Electronically Signed  By: Harrietta Sherry M.D.    On: 09/10/2023 18:05   ECHOCARDIOGRAM COMPLETE Result Date: 09/08/2023    ECHOCARDIOGRAM REPORT   Patient Name:   BOE DEANS Date of Exam: 09/08/2023 Medical Rec #:  [id number]         Height:       69.0 in Accession #:    [id number]        Weight:       160.3 lb Date of Birth:  08-09-1940         BSA:          1.880 m Patient Age:    83 years          BP:           141/63 mmHg Patient Gender: M                 HR:           100 bpm. Exam Location:  Inpatient Procedure: 2D Echo, Cardiac Doppler and Color Doppler (Both Spectral and Color            Flow Doppler were utilized during procedure). Indications:    I50.40* Unspecified combined systolic (congestive) and diastolic                 (congestive) heart failure  History:        Patient has prior history of Echocardiogram examinations, most                 recent 05/12/2022. CAD and Previous Myocardial Infarction, Prior                 CABG, Arrythmias:Atrial Fibrillation, Signs/Symptoms:Shortness                 of Breath and Dyspnea; Risk Factors:Dyslipidemia, Diabetes and                 Hypertension. Cancer.  Sonographer:    Ellouise Mose RDCS Referring Phys: [id number] [facility]  Sonographer Comments: Technically difficult study due to poor echo windows. Patient in high fowler's position. Patient in AFIB and destatted to 72 during exam. IMPRESSIONS  1. Left ventricular ejection fraction, by estimation, is 50 to 55%. The left ventricle has low normal function. The left ventricle has no regional wall motion abnormalities. Left ventricular diastolic parameters are indeterminate.  2. Right ventricular systolic function is normal. The right ventricular size is normal. Tricuspid regurgitation signal is inadequate for assessing PA pressure.  3. Left atrial size was moderately dilated.  4. The mitral valve is normal in structure. Trivial mitral valve regurgitation. No evidence of mitral stenosis.  5. The aortic valve is tricuspid. There is mild calcification of  the aortic valve. Aortic valve regurgitation is not visualized. Aortic valve sclerosis/calcification is present, without any evidence of aortic stenosis.  6. The inferior vena cava is normal in size with greater than 50% respiratory variability, suggesting right atrial pressure of 3 mmHg. FINDINGS  Left Ventricle: Left ventricular ejection fraction, by estimation, is 50 to 55%. The left ventricle has low normal function. The left ventricle has no regional wall motion abnormalities. The left ventricular internal cavity size was normal in size. There is no left ventricular hypertrophy. Left ventricular diastolic parameters are indeterminate. Right Ventricle: The right ventricular size is normal. No increase in right ventricular wall thickness. Right ventricular systolic function is normal. Tricuspid regurgitation signal is inadequate for assessing PA pressure. Left Atrium: Left atrial size was moderately dilated.  Right Atrium: Right atrial size was normal in size. Pericardium: There is no evidence of pericardial effusion. Mitral Valve: The mitral valve is normal in structure. Mild mitral annular calcification. Trivial mitral valve regurgitation. No evidence of mitral valve stenosis. Tricuspid Valve: The tricuspid valve is normal in structure. Tricuspid valve regurgitation is trivial. No evidence of tricuspid stenosis. Aortic Valve: The aortic valve is tricuspid. There is mild calcification of the aortic valve. Aortic valve regurgitation is not visualized. Aortic valve sclerosis/calcification is present, without any evidence of aortic stenosis. Pulmonic Valve: The pulmonic valve was normal in structure. Pulmonic valve regurgitation is trivial. No evidence of pulmonic stenosis. Aorta: The aortic root is normal in size and structure. Venous: The inferior vena cava is normal in size with greater than 50% respiratory variability, suggesting right atrial pressure of 3 mmHg. IAS/Shunts: No atrial level shunt detected by  color flow Doppler.  LEFT VENTRICLE PLAX 2D LVIDd:         5.02 cm LVIDs:         3.65 cm LV PW:         1.13 cm LV IVS:        1.09 cm LVOT diam:     2.38 cm LV SV:         77 LV SV Index:   41 LVOT Area:     4.45 cm  LV Volumes (MOD) LV vol d, MOD A2C: 67.4 ml LV vol d, MOD A4C: 95.3 ml LV vol s, MOD A2C: 36.9 ml LV vol s, MOD A4C: 40.7 ml LV SV MOD A2C:     30.5 ml LV SV MOD A4C:     95.3 ml LV SV MOD BP:      42.5 ml RIGHT VENTRICLE            IVC RV S prime:     9.90 cm/s  IVC diam: 1.37 cm TAPSE (M-mode): 1.2 cm LEFT ATRIUM             Index        RIGHT ATRIUM           Index LA diam:        4.99 cm 2.65 cm/m   RA Area:     12.80 cm LA Vol (A2C):   28.4 ml 15.10 ml/m  RA Volume:   32.30 ml  17.18 ml/m LA Vol (A4C):   45.8 ml 24.36 ml/m LA Biplane Vol: 37.5 ml 19.94 ml/m  AORTIC VALVE LVOT Vmax:   104.00 cm/s LVOT Vmean:  67.600 cm/s LVOT VTI:    0.173 m  AORTA Ao Root diam: 3.42 cm MITRAL VALVE MV Area (PHT): 3.99 cm     SHUNTS MV Decel Time: 190 msec     Systemic VTI:  0.17 m MV E velocity: 164.00 cm/s  Systemic Diam: 2.38 cm MV A velocity: 110.00 cm/s MV E/A ratio:  1.49 Toribio Fuel MD Electronically signed by Toribio Fuel MD Signature Date/Time: 09/08/2023/3:25:14 PM    Final    CT Chest Wo Contrast Result Date: 09/07/2023 CLINICAL DATA:  Pneumonia, complications suspected, same date chest radiograph done EXAM: CT CHEST WITHOUT CONTRAST TECHNIQUE: Multidetector CT imaging of the chest was performed following the standard protocol without IV contrast. RADIATION DOSE REDUCTION: This exam was performed according to the departmental dose-optimization program which includes automated exposure control, adjustment of the mA and/or kV according to patient size and/or use of iterative reconstruction technique. COMPARISON:  Same day chest radiograph FINDINGS: Cardiovascular: Sequelae  of prior CABG. Normal caliber thoracic aorta. No pericardial effusion. Mediastinum/Nodes: Prominent mediastinal  lymphadenopathy throughout, with the right paratracheal lymph node measuring up to 1.3 cm in short axis. Lungs/Pleura: Multifocal ground-glass opacities bilaterally with some septal wall thickening suggestive of pulmonary edema. There are also multifocal consolidative opacities, right greater than left. Moderate right pleural effusion. Trace left pleural effusion with loculated fluid in the left major fissure. Upper Abdomen: Pneumobilia. Status post cholecystectomy. Status post right nephrectomy. Musculoskeletal: Partially imaged ACDF hardware. Status post sternotomy. No acute findings. IMPRESSION: 1. Findings suggestive of pulmonary edema with moderate right pleural effusion and loculated fluid in the left major fissure. There are also multifocal consolidative opacities in the bilateral lungs, right greater than left, which could be seen with superimposed infection in the appropriate clinical setting. 2.  Mediastinal lymphadenopathy, likely reactive. Aortic Atherosclerosis (ICD10-I70.0). Electronically Signed   By: Michaeline Blanch M.D.   On: 09/07/2023 22:03   DG Chest Port 1 View Result Date: 09/07/2023 CLINICAL DATA:  10026. Shortness of breath worsening over the past 2 days. EXAM: PORTABLE CHEST 1 VIEW COMPARISON:  Portable chest 07/07/2023. FINDINGS: 8:26 p.m. there is chronic pleural-parenchymal disease in the left lower lung field. Small pleural effusions are beginning to form. The heart is slightly enlarged with CABG changes again noted and increased central vascular prominence. There is mild generalized interstitial consolidation consistent with interstitial edema. Findings consistent with CHF or fluid overload. There is patchy increased opacity in the left lower lung field which could be atelectasis, pneumonia or alveolar edema. No other focal infiltrate is seen. The mediastinum is normally outlined. Aortic atherosclerosis. Multilevel lower cervical fusion plating is again shown. Degenerative change  thoracic spine. IMPRESSION: 1. Findings consistent with mild CHF or fluid overload. 2. Patchy increased opacity in the left lower lung field which could be atelectasis, pneumonia or alveolar edema. 3. Chronic pleural-parenchymal disease in the left lower lung field. 4. Aortic atherosclerosis. Electronically Signed   By: Francis Quam M.D.   On: 09/07/2023 20:38    Microbiology: Results for orders placed or performed during the hospital encounter of 09/07/23  Culture, blood (Routine X 2) w Reflex to ID Panel     Status: None   Collection Time: 09/07/23  8:15 PM   Specimen: BLOOD LEFT ARM  Result Value Ref Range Status   Specimen Description   Final    BLOOD LEFT ARM Performed at [facility], [address], [place], [place] [address]    Special Requests   Final    BOTTLES DRAWN AEROBIC AND ANAEROBIC Blood Culture adequate volume Performed at [facility], [address], [place], [place] [address]    Culture   Final    NO GROWTH 5 DAYS Performed at [facility], [address] [place], [place] [address]    Report Status 09/13/2023 FINAL  Final  Resp panel by RT-PCR (RSV, Flu A&B, Covid) Anterior Nasal Swab     Status: None   Collection Time: 09/07/23  8:19 PM   Specimen: Anterior Nasal Swab  Result Value Ref Range Status   SARS Coronavirus 2 by RT PCR NEGATIVE NEGATIVE Final    Comment: (NOTE) SARS-CoV-2 target nucleic acids are NOT DETECTED.  The SARS-CoV-2 RNA is generally detectable in upper respiratory specimens during the acute phase of infection. The lowest concentration of SARS-CoV-2 viral copies this assay can detect is 138 copies/mL. A negative result does not preclude SARS-Cov-2 infection and should not be used as the sole  basis for treatment or other patient management decisions. A negative result may occur with  improper specimen collection/handling, submission of specimen other than nasopharyngeal swab, presence of  viral mutation(s) within the areas targeted by this assay, and inadequate number of viral copies(<138 copies/mL). A negative result must be combined with clinical observations, patient history, and epidemiological information. The expected result is Negative.  Fact Sheet for Patients:  BloggerCourse.com  Fact Sheet for Healthcare Providers:  SeriousBroker.it  This test is no t yet approved or cleared by the [place]  FDA and  has been authorized for detection and/or diagnosis of SARS-CoV-2 by FDA under an Emergency Use Authorization (EUA). This EUA will remain  in effect (meaning this test can be used) for the duration of the COVID-19 declaration under Section 564(b)(1) of the Act, 21 U.S.C.section 360bbb-3(b)(1), unless the authorization is terminated  or revoked sooner.       Influenza A by PCR NEGATIVE NEGATIVE Final   Influenza B by PCR NEGATIVE NEGATIVE Final    Comment: (NOTE) The Xpert Xpress SARS-CoV-2/FLU/RSV plus assay is intended as an aid in the diagnosis of influenza from Nasopharyngeal swab specimens and should not be used as a sole basis for treatment. Nasal washings and aspirates are unacceptable for Xpert Xpress SARS-CoV-2/FLU/RSV testing.  Fact Sheet for Patients: BloggerCourse.com  Fact Sheet for Healthcare Providers: SeriousBroker.it  This test is not yet approved or cleared by the [place]  FDA and has been authorized for detection and/or diagnosis of SARS-CoV-2 by FDA under an Emergency Use Authorization (EUA). This EUA will remain in effect (meaning this test can be used) for the duration of the COVID-19 declaration under Section 564(b)(1) of the Act, 21 U.S.C. section 360bbb-3(b)(1), unless the authorization is terminated or revoked.     Resp Syncytial Virus by PCR NEGATIVE NEGATIVE Final    Comment: (NOTE) Fact Sheet for  Patients: BloggerCourse.com  Fact Sheet for Healthcare Providers: SeriousBroker.it  This test is not yet approved or cleared by the [place]  FDA and has been authorized for detection and/or diagnosis of SARS-CoV-2 by FDA under an Emergency Use Authorization (EUA). This EUA will remain in effect (meaning this test can be used) for the duration of the COVID-19 declaration under Section 564(b)(1) of the Act, 21 U.S.C. section 360bbb-3(b)(1), unless the authorization is terminated or revoked.  Performed at [facility], [address], [place], [place] [address]   Culture, blood (Routine X 2) w Reflex to ID Panel     Status: None   Collection Time: 09/07/23  8:20 PM   Specimen: BLOOD LEFT FOREARM  Result Value Ref Range Status   Specimen Description   Final    BLOOD LEFT FOREARM Performed at [facility], [address] [place], [place] [address]    Special Requests   Final    BOTTLES DRAWN AEROBIC AND ANAEROBIC Blood Culture results may not be optimal due to an inadequate volume of blood received in culture bottles Performed at [facility], [address], [place], [place] [address]    Culture   Final    NO GROWTH 5 DAYS Performed at [facility], [address] [place], [place] [address]    Report Status 09/13/2023 FINAL  Final  MRSA Next Gen by PCR, Nasal     Status: Abnormal   Collection Time: 09/08/23  5:56 AM   Specimen: Nasal Mucosa; Nasal Swab  Result Value Ref Range Status   MRSA by PCR Next Gen DETECTED (A) NOT  DETECTED Final    Comment: RESULT CALLED TO, READ BACK BY AND VERIFIED WITH: RN CRESCENDA MCKENNY ON 09/08/23 @ 1209 BY JC (NOTE) The GeneXpert MRSA Assay (FDA approved for NASAL specimens only), is one component of a comprehensive MRSA colonization surveillance program. It is not intended to diagnose MRSA infection nor to guide or monitor  treatment for MRSA infections. Test performance is not FDA approved in patients less than 61 years old. Performed at [facility], [address] [place], [place] [address]    *Note: Due to a large number of results and/or encounters for the requested time period, some results have not been displayed. A complete set of results can be found in Results Review.    Labs: CBC: Recent Labs  Lab 09/07/23 2019 09/08/23 9360 09/09/23 0227 09/11/23 0229 09/12/23 0256 09/13/23 0239 09/14/23 0229  WBC 15.2*   < > 10.7* 9.7 7.7 8.1 8.9  NEUTROABS 11.8*  --   --   --   --   --   --   HGB 10.6*   < > 9.2* 8.8* 9.5* 9.9* 10.4*  HCT 32.8*   < > 28.3* 26.8* 29.6* 31.0* 32.4*  MCV 88.9   < > 87.9 87.3 89.2 88.1 87.8  PLT 203   < > 153 188 191 228 230   < > = values in this interval not displayed.   Basic Metabolic Panel: Recent Labs  Lab 09/10/23 0734 09/11/23 0229 09/12/23 0256 09/13/23 0239 09/14/23 0229  NA 139 137 139 141 141  K 3.8 3.8 4.0 3.9 4.2  CL 100 100 100 101 99  CO2 27 26 27 28 28   GLUCOSE 112* 108* 114* 122* 107*  BUN 56* 59* 62* 61* 58*  CREATININE 3.11* 2.93* 3.11* 2.92* 2.78*  CALCIUM  8.5* 8.2* 8.6* 8.7* 8.9   Liver Function Tests: Recent Labs  Lab 09/07/23 2019 09/08/23 0639 09/09/23 0227  AST 37 21 18  ALT 18 16 14   ALKPHOS 89 68 58  BILITOT 0.6 1.2 1.0  PROT 7.4 6.6 6.1*  ALBUMIN  4.0 2.9* 2.6*   CBG: No results for input(s): GLUCAP in the last 168 hours.  Discharge time spent: approximately 45 minutes spent on discharge counseling, evaluation of patient on day of discharge, and coordination of discharge planning with nursing, social work, pharmacy and case management  Signed: Lonni SHAUNNA Dalton, MD [facility] 09/14/2023

## 2023-09-14 NOTE — Progress Notes (Incomplete)
 HEART & VASCULAR TRANSITION OF CARE CONSULT NOTE    Referring Physician: Dr. Jonel PCP: Theophilus Andrews, Tully GRADE, MD  Cardiologist: Debby Sor, MD (Inactive)   Chief Complaint: HFimpEF  HPI: Referred to clinic by Dr. Jonel for heart failure consultation.   [name] is a 83 y.o. male with HFimpEF, CAD s/p CABG, COPD, HTN, RCC s/p nephrectomy and CKD IV. Followed by Dr. Sor.   Admitted 8/25 with sepsis 2/2 CAP and new onset a flutter. Completed abx. Now with HFimpEF, echo showed EF 50-55% (previously 40-45%). Cardiology was consulted with concern for volume overload. Diuresed well with IV lasix  and transitioned to PO at d/c. Unable to get TEE/DCCV with hx of esophageal stricture. Plan to continue 3 weeks of A/C for DCCV.  Today he presents for AHF [facility] clinic visit with his daughter and grandson. Overall feeling pretty good. Denies palpitations, CP, edema, or PND/Orthopnea. Intt dizziness in the morning and with abrupt movements. SOB with activity but much improved. Appetite ok, he watches what he eats. Drinks less that 64oz/day. No fever or chills. Weight at home 145-146 pounds. Taking all medications, his daughter is a Engineer, civil (consulting) and helps set up a pill box for him. Denies ETOH, tobacco or drug use.   Cardiac Testing  - Echo 8/25: EF 50-55%, no RWMA, RV normal, LA mod dilated, trivial MR/TR - Lexi scan 4/24: fixed inferior wall defect. No ischemia. EF 56% - Echo 4/24: EF 50-55%, LV no RWMA, RV normal, LA mod dilated, mild MR, mild dilatation of the ascending aorta 39 mm.  Past Medical History:  Diagnosis Date   Allergy     Anxiety    B12 deficiency anemia    Blood transfusion without reported diagnosis    CAD (coronary artery disease)    Cancer (HCC)    bladder, Right renal cell also   Chronic kidney disease    Colon polyps    COPD (chronic obstructive pulmonary disease) (HCC)    Depression    Diabetes mellitus (HCC)    Dyspnea    Esophagus, Barrett's    GERD  (gastroesophageal reflux disease)    Headache    History of bladder cancer    Bladder cancer 8 times   History of hiatal hernia    Hyperlipidemia    Hypertension    Localized osteoarthrosis, lower leg    Myocardial infarction (HCC) 2021   Pneumonia    Restless leg syndrome    Sleep apnea    does not wear cpap   Stenosis of esophagus     Current Outpatient Medications  Medication Sig Dispense Refill   acetaminophen  (TYLENOL ) 500 MG tablet Take 1,000 mg by mouth daily as needed for moderate pain (pain score 4-6) or mild pain (pain score 1-3).     albuterol  (VENTOLIN  HFA) 108 (90 Base) MCG/ACT inhaler Inhale 2 puffs into the lungs every 6 (six) hours as needed for wheezing or shortness of breath. 1 each 6   apixaban  (ELIQUIS ) 2.5 MG TABS tablet Take 1 tablet (2.5 mg total) by mouth 2 (two) times daily. 180 tablet 0   clopidogrel  (PLAVIX ) 75 MG tablet TAKE 1 TABLET BY MOUTH ONCE  DAILY 100 tablet 2   Coenzyme Q10 (CO Q-10) 300 MG CAPS Take 300 mg by mouth every evening.     cyanocobalamin  (VITAMIN B12) 1000 MCG/ML injection Inject 1 mL (1,000 mcg total) into the muscle every 30 (thirty) days. 6 mL 11   cyclobenzaprine  (FLEXERIL ) 5 MG tablet Take 1  tablet (5 mg total) by mouth at bedtime as needed for muscle spasms. 30 tablet 0   ezetimibe  (ZETIA ) 10 MG tablet TAKE 1 TABLET BY MOUTH DAILY 100 tablet 1   ferrous sulfate 325 (65 FE) MG EC tablet Take 325 mg by mouth daily.     fluticasone  (FLONASE ) 50 MCG/ACT nasal spray Place 1 spray into both nostrils daily.     Fluticasone -Umeclidin-Vilant (TRELEGY ELLIPTA ) 200-62.5-25 MCG/ACT AEPB Inhale 1 puff into the lungs daily. 60 each 11   furosemide  (LASIX ) 20 MG tablet Take 1 tablet (20 mg total) by mouth every other day. 15 tablet 0   isosorbide  mononitrate (IMDUR ) 30 MG 24 hr tablet Take 1 tablet (30 mg total) by mouth daily. 90 tablet 0   Menthol, Topical Analgesic, (BENGAY EX) Apply 1 application topically daily as needed (pain).      Multiple Vitamin (MULTIVITAMIN) tablet Take 1 tablet by mouth daily.     nebivolol  (BYSTOLIC ) 10 MG tablet Take 0.5 tablets (5 mg total) by mouth daily. 45 tablet 0   nitroGLYCERIN  (NITROSTAT ) 0.4 MG SL tablet PLACE 1 TABLET UNDER THE TONGUE EVERY 5 MINUTES AS NEEDED FOR CHEST PAIN 25 tablet 0   Nystatin (GERHARDT'S BUTT CREAM) CREA Apply 1 Application topically daily. 60 each 0   oxyCODONE -acetaminophen  (PERCOCET) 10-325 MG tablet Take 1 tablet by mouth 2 (two) times daily. 60 tablet 0   oxyCODONE -acetaminophen  (PERCOCET) 10-325 MG tablet Take 1 tablet by mouth 2 (two) times daily. 60 tablet 0   oxyCODONE -acetaminophen  (PERCOCET) 10-325 MG tablet Take 1 tablet by mouth 2 (two) times daily. 60 tablet 0   pantoprazole  (PROTONIX ) 40 MG tablet Take 1 tablet (40 mg total) by mouth daily. (Patient taking differently: Take 40 mg by mouth every evening.) 100 tablet 3   prednisoLONE acetate (PRED FORTE) 1 % ophthalmic suspension Place 1 drop into both eyes 4 (four) times daily.     rOPINIRole  (REQUIP ) 2 MG tablet TAKE 1 TABLET BY MOUTH AT  BEDTIME (Patient taking differently: Take 2 mg by mouth every evening.) 100 tablet 1   rosuvastatin  (CRESTOR ) 10 MG tablet TAKE 1 TABLET BY MOUTH DAILY 90 tablet 2   saccharomyces boulardii (FLORASTOR) 250 MG capsule Take 250 mg by mouth every evening.     sodium zirconium cyclosilicate  (LOKELMA ) 5 g packet Take 5 g by mouth 2 (two) times daily.     venlafaxine  (EFFEXOR ) 100 MG tablet Take 1 tablet (100 mg total) by mouth daily. 90 tablet 1   [Paused] amLODipine  (NORVASC ) 5 MG tablet TAKE 1 TABLET BY MOUTH DAILY (Patient not taking: Reported on 09/17/2023) 100 tablet 1   No current facility-administered medications for this encounter.    No Known Allergies   Social History   Socioeconomic History   Marital status: Widowed    Spouse name: Not on file   Number of children: 3   Years of education: Not on file   Highest education level: 12th grade  Occupational  History   Occupation: Education officer, environmental ( part-time)  Tobacco Use   Smoking status: Former    Current packs/day: 0.00    Average packs/day: 0.5 packs/day for 5.0 years (2.5 ttl pk-yrs)    Types: Cigarettes    Start date: 03/31/1971    Quit date: 03/30/1976    Years since quitting: 47.4   Smokeless tobacco: Never  Vaping Use   Vaping status: Never Used  Substance and Sexual Activity   Alcohol use: No    Alcohol/week: 0.0 standard drinks of  alcohol   Drug use: No   Sexual activity: Yes  Other Topics Concern   Not on file  Social History Narrative   Not on file   Social Drivers of Health   Financial Resource Strain: Low Risk  (09/09/2023)   Overall Financial Resource Strain (CARDIA)    Difficulty of Paying Living Expenses: Not very hard  Food Insecurity: No Food Insecurity (09/15/2023)   Hunger Vital Sign    Worried About Running Out of Food in the Last Year: Never true    Ran Out of Food in the Last Year: Never true  Transportation Needs: No Transportation Needs (09/15/2023)   PRAPARE - Administrator, Civil Service (Medical): No    Lack of Transportation (Non-Medical): No  Physical Activity: Unknown (02/10/2023)   Exercise Vital Sign    Days of Exercise per Week: 0 days    Minutes of Exercise per Session: Not on file  Recent Concern: Physical Activity - Inactive (02/10/2023)   Exercise Vital Sign    Days of Exercise per Week: 0 days    Minutes of Exercise per Session: 30 min  Stress: No Stress Concern Present (02/10/2023)   [employer] of Occupational Health - Occupational Stress Questionnaire    Feeling of Stress : Not at all  Social Connections: Moderately Integrated (07/08/2023)   Social Connection and Isolation Panel    Frequency of Communication with Friends and Family: More than three times a week    Frequency of Social Gatherings with Friends and Family: Twice a week    Attends Religious Services: More than 4 times per year    Active Member of [employer] or  Organizations: Yes    Attends Banker Meetings: More than 4 times per year    Marital Status: Widowed  Intimate Partner Violence: Not At Risk (07/08/2023)   Humiliation, Afraid, Rape, and Kick questionnaire    Fear of Current or Ex-Partner: No    Emotionally Abused: No    Physically Abused: No    Sexually Abused: No   Family History  Problem Relation Age of Onset   Melanoma Mother    Stroke Father    Hypertension Father    Coronary artery disease Other    Colon cancer Neg Hx    Esophageal cancer Neg Hx    Stomach cancer Neg Hx    Rectal cancer Neg Hx    Pancreatic cancer Neg Hx    Liver disease Neg Hx    Inflammatory bowel disease Neg Hx     Vitals:   09/17/23 1054  BP: (!) 130/54  Pulse: 68  SpO2: 96%  Weight: 69.3 kg (152 lb 12.8 oz)  Height: 5' 9 (1.753 m)    PHYSICAL EXAM: General:  elderly appearing.  No respiratory difficulty. Walked into clinic.  Neck: JVD flat.  Cor: Regular rate & rhythm. No murmurs. Lungs: clear Extremities: no edema  Neuro: alert & oriented x 3. Affect pleasant.   Wt Readings from Last 3 Encounters:  09/17/23 69.3 kg (152 lb 12.8 oz)  09/16/23 68 kg (150 lb)  09/15/23 66.2 kg (146 lb)   ReDs reading: 24 %, abnormal   ECG: NSR 68 bpm (Personally reviewed)    ASSESSMENT & PLAN: HFimpEF - Echo 8/25: EF 50-55%, no RWMA, RV normal, LA mod dilated, trivial MR/TR - Euvolemic to mildly hypovolemic on exam. Lab work from yesterday reviewed with K 5.3 and SCr up to 3.36. Suspect over diuresed. Plan to hold lasix  through the  weekend to be resumed Tuesday. (Has nephrology visit coming up Wednesday). ReDS clip low.  NYHA IIIa Diuretic- Holding lasix  as above. Plan to restart Tuesday EOD. Can consider changing to PRN dosing.  BB- Continue nevibolol 5 mg daily - GDMT limited with CKD stage IV - Not a candidate for advanced therapies at this time with advanced age and multiple comorbidities.   Atrial flutter - Hx esophageal  stricture which would prohibit TEE. TEE/DCCV deferred during admission. Plan for 3 weeks uninterrupted A/C with plans of DCCV after. - Back in NSR on EKG today! - Continue nevibolol and eliquis  2.5 mg BID  CAD s/p CABG x4 01' - Denies CP - Continue statin, plavix  - Off Imdur  to allow renal perfusion  4. CKD stage IV - Baseline appears to be around 2.6-2.8 - SCr up to 3.36 on labs yesterday. Changes as above - Has nephrology follow up Wednesday  5. Hyperkalemia - Was only taking prescribed BID Lokelma  once a day. Instructed to take BID as prescribed. Plan for repeat labs at nephrology f/u.   - 5.3 on labs yesterday - Plan for repeat labs at nephrology visit   Referred to HFSW (PCP, Medications, Transportation, ETOH Abuse, Drug Abuse, Insurance, Financial ): No Refer to Pharmacy: No Refer to Home Health: No Refer to Advanced Heart Failure Clinic: No  Refer to General Cardiology: already established  Has f/u with gen cards coming up.

## 2023-09-14 NOTE — Plan of Care (Signed)
  Problem: Health Behavior/Discharge Planning: Goal: Ability to manage health-related needs will improve Outcome: Progressing   Problem: Clinical Measurements: Goal: Ability to maintain clinical measurements within normal limits will improve Outcome: Progressing Goal: Will remain free from infection Outcome: Progressing Goal: Diagnostic test results will improve Outcome: Progressing Goal: Respiratory complications will improve Outcome: Progressing Goal: Cardiovascular complication will be avoided Outcome: Progressing   Problem: Activity: Goal: Risk for activity intolerance will decrease Outcome: Progressing   Problem: Nutrition: Goal: Adequate nutrition will be maintained Outcome: Progressing   Problem: Coping: Goal: Level of anxiety will decrease Outcome: Progressing   Problem: Elimination: Goal: Will not experience complications related to bowel motility Outcome: Progressing Goal: Will not experience complications related to urinary retention Outcome: Progressing   Problem: Pain Managment: Goal: General experience of comfort will improve and/or be controlled Outcome: Progressing   Problem: Safety: Goal: Ability to remain free from injury will improve Outcome: Progressing   Problem: Skin Integrity: Goal: Risk for impaired skin integrity will decrease Outcome: Progressing   Problem: Education: Goal: Ability to demonstrate management of disease process will improve Outcome: Progressing Goal: Ability to verbalize understanding of medication therapies will improve Outcome: Progressing Goal: Individualized Educational Video(s) Outcome: Progressing   Problem: Activity: Goal: Capacity to carry out activities will improve Outcome: Progressing   Problem: Cardiac: Goal: Ability to achieve and maintain adequate cardiopulmonary perfusion will improve Outcome: Progressing   Problem: Activity: Goal: Ability to tolerate increased activity will improve Outcome:  Progressing   Problem: Clinical Measurements: Goal: Ability to maintain a body temperature in the normal range will improve Outcome: Progressing   Problem: Respiratory: Goal: Ability to maintain adequate ventilation will improve Outcome: Progressing Goal: Ability to maintain a clear airway will improve Outcome: Progressing

## 2023-09-14 NOTE — Progress Notes (Signed)
 Progress Note  Patient Name: [name] Date of Encounter: 09/14/2023  Primary Cardiologist: Debby Sor, MD (Inactive)   Subjective   Patient seen and examined at his bedside.   Inpatient Medications    Scheduled Meds:  apixaban   2.5 mg Oral BID   budesonide -glycopyrrolate -formoterol   2 puff Inhalation BID   clopidogrel   75 mg Oral Daily   docusate sodium   100 mg Oral BID   doxycycline   100 mg Oral Q12H   ezetimibe   10 mg Oral Daily   mupirocin  ointment  1 Application Nasal BID   nebivolol   5 mg Oral Daily   pantoprazole   40 mg Oral Daily   rOPINIRole   2 mg Oral QHS   rosuvastatin   10 mg Oral Daily   sodium chloride  flush  3 mL Intravenous Q12H   venlafaxine   100 mg Oral Daily   Continuous Infusions:  cefTRIAXone  (ROCEPHIN )  IV 2 g (09/13/23 2158)   PRN Meds: acetaminophen  **OR** acetaminophen , alum & mag hydroxide-simeth, ipratropium-albuterol , nitroGLYCERIN , ondansetron  **OR** ondansetron  (ZOFRAN ) IV, oxyCODONE -acetaminophen  **AND** [DISCONTINUED] oxyCODONE    Vital Signs    Vitals:   09/13/23 2305 09/14/23 0625 09/14/23 0730 09/14/23 0738  BP: (!) 101/56 134/81  139/62  Pulse: 82 96 80 78  Resp: 17 18 18 20   Temp: 98.4 F (36.9 C) 97.8 F (36.6 C)  97.8 F (36.6 C)  TempSrc: Oral Oral  Oral  SpO2: 92% 92% 95% 92%  Weight:  66.7 kg    Height:        Intake/Output Summary (Last 24 hours) at 09/14/2023 0748 Last data filed at 09/13/2023 2200 Gross per 24 hour  Intake 360 ml  Output 500 ml  Net -140 ml   Filed Weights   09/12/23 0500 09/13/23 0533 09/14/23 0625  Weight: 68.4 kg 67.3 kg 66.7 kg    Telemetry    Typical atrial flutter with controlled ventricular rate - Personally Reviewed  ECG   Atrial flutter with 4:1 A-V conduction- Personally Reviewed  Physical Exam    General: Comfortable, sitting up in his bed Head: Atraumatic, normal size  Eyes: PEERLA, EOMI  Neck: Supple, normal JVD Cardiac: Normal S1, S2; RRR; no murmurs, rubs,  or gallops Lungs: Clear to auscultation bilaterally Abd: Soft, nontender, no hepatomegaly  Ext: warm, no edema Musculoskeletal: No deformities, BUE and BLE strength normal and equal Skin: Warm and dry, no rashes   Neuro: Alert and oriented to person, place, time, and situation, CNII-XII grossly intact, no focal deficits  Psych: Normal mood and affect   Labs    Chemistry Recent Labs  Lab 09/07/23 2019 09/08/23 9360 09/09/23 0227 09/10/23 0734 09/12/23 0256 09/13/23 0239 09/14/23 0229  NA 140 141 138   < > 139 141 141  K 4.4 3.7 3.6   < > 4.0 3.9 4.2  CL 102 104 98   < > 100 101 99  CO2 24 26 27    < > 27 28 28   GLUCOSE 126* 109* 106*   < > 114* 122* 107*  BUN 37* 37* 41*   < > 62* 61* 58*  CREATININE 2.65* 2.65* 2.89*   < > 3.11* 2.92* 2.78*  CALCIUM  8.6* 8.3* 8.2*   < > 8.6* 8.7* 8.9  PROT 7.4 6.6 6.1*  --   --   --   --   ALBUMIN  4.0 2.9* 2.6*  --   --   --   --   AST 37 21 18  --   --   --   --  ALT 18 16 14   --   --   --   --   ALKPHOS 89 68 58  --   --   --   --   BILITOT 0.6 1.2 1.0  --   --   --   --   GFRNONAA 23* 23* 21*   < > 19* 21* 22*  ANIONGAP 14 11 13    < > 12 12 14    < > = values in this interval not displayed.     Hematology Recent Labs  Lab 09/12/23 0256 09/13/23 0239 09/14/23 0229  WBC 7.7 8.1 8.9  RBC 3.32* 3.52* 3.69*  HGB 9.5* 9.9* 10.4*  HCT 29.6* 31.0* 32.4*  MCV 89.2 88.1 87.8  MCH 28.6 28.1 28.2  MCHC 32.1 31.9 32.1  RDW 14.3 14.2 14.2  PLT 191 228 230    Cardiac EnzymesNo results for input(s): TROPONINI in the last 168 hours. No results for input(s): TROPIPOC in the last 168 hours.   BNP Recent Labs  Lab 09/07/23 2019  PROBNP 13,045.0*     DDimer No results for input(s): DDIMER in the last 168 hours.   Radiology    DG CHEST PORT 1 VIEW Result Date: 09/12/2023 CLINICAL DATA:  Follow-up exam.  Pulmonary edema. EXAM: PORTABLE CHEST 1 VIEW COMPARISON:  09/10/2023. FINDINGS: The heart size and mediastinal contours are  stable. There is atherosclerotic calcification of the aorta. There is improved aeration of the lungs bilaterally with mild residual airspace disease at the left lung base. There is a small left pleural effusion. No pneumothorax is seen. Sternotomy wires and cervical spinal fusion hardware are noted. No acute osseous abnormality. IMPRESSION: Improved aeration of the lungs bilaterally. Small residual left pleural effusion with atelectasis, edema, or infiltrate at the left lung base. Electronically Signed   By: Leita Birmingham M.D.   On: 09/12/2023 18:32    Cardiac Studies  Echo   Patient Profile     83 y.o. male  with a hx of CAD s/p CABG x 4 in 2001, DES x 3 overlapping in 2020, hypertension hyperlipidemia, OSA not on CPAP, DM2, bladder cancer, right renal cell carcinoma, COPD, and GERD   Assessment & Plan    Atrial flutter  Chronic anticoagulation  CAD s/p CABG x 4 Hx of Cardiomyopathy- HFimEF  He is is still in atrial flutter - will benefit from DCCV, but the problem is his hx of esophageal stricture prohibits moving to TEE DCCV which would be the appropriate was to proceed if need rhythm control prior to 3 weeks. Otherwise it will be best to wait for 3 weeks of anticoagulation prior to cardioversion.   Now on Nebivolol  and Eliquis  - no dosing change today  Agree with stopping aspirin . Continue Plavix . No reports of anginal symptoms.   Clinically he does not appear to be significantly volume overloaded.  He has gotten some IV Lasix .  His creatinine today continues to trend down now at his baseline 2.78 from 2.92 yesterday. Can restart  diuretics - lasix  po 40 mg every other day.  He has improved clinically and does not need a heart catheterization at this time.  In terms of his discharge medication I agree with going home on Nebivolol  5 mg daily and Imdur  30 mg daily.  We will set him up for follow-up in the outpatient setting.   For questions or updates, please contact [facility] Please consult [email or web address] for contact info under Cardiology/STEMI.      Signed, Yama Nielson  Salim Forero, DO  09/14/2023, 7:48 AM

## 2023-09-14 NOTE — TOC Progression Note (Signed)
 Transition of Care [facility]) - Progression Note    Patient Details  Name: [name] MRN: [id number] Date of Birth: Mar 13, 1940  Transition of Care [facility]) CM/SW Contact  Leoni Goodness, Mliss HERO, RN Phone Number: 09/14/2023, 11:48 AM  Clinical Narrative:    Patient medically stable for discharge today per MD.  Outpatient physical therapy referral has been made and DME delivered to room.  No other discharge needs identified.    Expected Discharge Plan: OP Rehab Barriers to Discharge: Barriers Resolved               Expected Discharge Plan and Services   Discharge Planning Services: CM Consult   Living arrangements for the past 2 months: Single Family Home Expected Discharge Date: 09/14/23               DME Arranged: Vannie rolling with seat DME Agency: [employer]     Representative spoke with at DME Agency: [place]             Social Drivers of Health (SDOH) Interventions SDOH Screenings   Food Insecurity: No Food Insecurity (07/12/2023)  Housing: Unknown (09/09/2023)  Transportation Needs: No Transportation Needs (07/12/2023)  Utilities: Not At Risk (07/12/2023)  Alcohol Screen: Low Risk  (09/09/2023)  Depression (PHQ2-9): High Risk (07/20/2023)  Financial Resource Strain: Low Risk  (09/09/2023)  Physical Activity: Unknown (02/10/2023)  Recent Concern: Physical Activity - Inactive (02/10/2023)  Social Connections: Moderately Integrated (07/08/2023)  Stress: No Stress Concern Present (02/10/2023)  Tobacco Use: Medium Risk (09/08/2023)    Readmission Risk Interventions    07/08/2023    9:48 AM  Readmission Risk Prevention Plan  Transportation Screening Complete  HRI or Home Care Consult Complete  Social Work Consult for Recovery Care Planning/Counseling Complete  Palliative Care Screening Not Applicable  Medication Review (RN Care Manager) Complete   Mliss MICAEL Fass, RN, BSN  Trauma/Neuro ICU Case Manager [phone]

## 2023-09-14 NOTE — Progress Notes (Signed)
 Nephrology Follow-Up Consult note   Assessment/Recommendations: [name] is a/an 83 y.o. male with a past medical history significant for CAD hx CABG, bladder cancer and renal cell cancer s/p R nephrectomy, CKD, HTN , admitted for SOB, sepsis 2/2 PNA.      Non-Oliguric AKI (improved): Likely secondary to Sepsis c/b solitary kidney -Chart reviewed: (medications acceptable, does not appear to have been exposed to nephrotoxins with imaging or hypotensive x1 overnight).   -Continue to monitor daily Cr, Dose meds for GFR -Monitor Daily I/Os, Daily weight  -Maintain MAP>65 for optimal renal perfusion.  -Avoid nephrotoxic medications including NSAIDs -Use synthetic opioids (Fentanyl /Dilaudid ) if needed -Currently no indication for HD - Pending discharge.  Will arrange for nephrology hospital follow-up.  Volume Status: Appears euvolemic on exam. Based on our examination and review of available imaging, our recommendation is continue supportive care.  Hypertension: Fairly normotensive.  Monitor.  Medical Problem: CAP Sepsis: On IV Abx Acute HFrEF: On BB, PO lasix  Aflutter: on BB, Eliquis  CAD: On BB, Imdur , Eliquis    Recommendations conveyed to primary service.    Evalene HERO Deena Shaub [facility] 09/14/2023 7:54 AM  ___________________________________________________________  CC: SOB  Interval History/Subjective: No events reported overnight.  Feels good.  Pending discharge.  Nonoliguric.   Medications:  Current Facility-Administered Medications  Medication Dose Route Frequency Provider Last Rate Last Admin   acetaminophen  (TYLENOL ) tablet 650 mg  650 mg Oral Q6H PRN Sundil, Subrina, MD       Or   acetaminophen  (TYLENOL ) suppository 650 mg  650 mg Rectal Q6H PRN Sundil, Subrina, MD       alum & mag hydroxide-simeth (MAALOX/MYLANTA) 200-200-20 MG/5ML suspension 30 mL  30 mL Oral Q4H PRN Jonel Lonni SQUIBB, MD   30 mL at 09/13/23 2308   apixaban  (ELIQUIS )  tablet 2.5 mg  2.5 mg Oral BID Jonel Lonni SQUIBB, MD   2.5 mg at 09/13/23 2153   budesonide -glycopyrrolate -formoterol  (BREZTRI ) 160-9-4.8 MCG/ACT inhaler 2 puff  2 puff Inhalation BID Sundil, Subrina, MD   2 puff at 09/14/23 0730   cefTRIAXone  (ROCEPHIN ) 2 g in sodium chloride  0.9 % 100 mL IVPB  2 g Intravenous Q24H Sundil, Subrina, MD 200 mL/hr at 09/13/23 2158 2 g at 09/13/23 2158   clopidogrel  (PLAVIX ) tablet 75 mg  75 mg Oral Daily Alvan Dorn FALCON, MD   75 mg at 09/13/23 9153   docusate sodium  (COLACE) capsule 100 mg  100 mg Oral BID Sundil, Subrina, MD   100 mg at 09/10/23 9043   doxycycline  (VIBRA -TABS) tablet 100 mg  100 mg Oral Q12H Jonel Lonni SQUIBB, MD   100 mg at 09/13/23 2153   ezetimibe  (ZETIA ) tablet 10 mg  10 mg Oral Daily Sundil, Subrina, MD   10 mg at 09/13/23 9153   furosemide  (LASIX ) tablet 40 mg  40 mg Oral QODAY Tobb, Kardie, DO       ipratropium-albuterol  (DUONEB) 0.5-2.5 (3) MG/3ML nebulizer solution 3 mL  3 mL Nebulization Q6H PRN Sundil, Subrina, MD       mupirocin  ointment (BACTROBAN ) 2 % 1 Application  1 Application Nasal BID Jonel Lonni SQUIBB, MD   1 Application at 09/13/23 2153   nebivolol  (BYSTOLIC ) tablet 5 mg  5 mg Oral Daily Danford, Lonni SQUIBB, MD   5 mg at 09/13/23 0847   nitroGLYCERIN  (NITROSTAT ) SL tablet 0.4 mg  0.4 mg Sublingual Q5 Min x 3 PRN Sundil, Subrina, MD       ondansetron  (ZOFRAN ) tablet 4 mg  4 mg Oral Q6H PRN Sundil, Subrina, MD       Or   ondansetron  (ZOFRAN ) injection 4 mg  4 mg Intravenous Q6H PRN Sundil, Subrina, MD       oxyCODONE -acetaminophen  (PERCOCET/ROXICET) 5-325 MG per tablet 1 tablet  1 tablet Oral Q8H PRN Jonel Lonni SQUIBB, MD   1 tablet at 09/13/23 2152   pantoprazole  (PROTONIX ) EC tablet 40 mg  40 mg Oral Daily Sundil, Subrina, MD   40 mg at 09/13/23 0846   rOPINIRole  (REQUIP ) tablet 2 mg  2 mg Oral QHS Sundil, Subrina, MD   2 mg at 09/13/23 2153   rosuvastatin  (CRESTOR ) tablet 10 mg  10 mg Oral Daily  Sundil, Subrina, MD   10 mg at 09/13/23 0846   sodium chloride  flush (NS) 0.9 % injection 3 mL  3 mL Intravenous Q12H Sundil, Subrina, MD   3 mL at 09/13/23 2303   venlafaxine  (EFFEXOR ) tablet 100 mg  100 mg Oral Daily Sundil, Subrina, MD   100 mg at 09/13/23 9152      Review of Systems: 10 systems reviewed and negative except per interval history/subjective  Physical Exam: Vitals:   09/14/23 0730 09/14/23 0738  BP:  139/62  Pulse: 80 78  Resp: 18 20  Temp:  97.8 F (36.6 C)  SpO2: 95% 92%   No intake/output data recorded.  Intake/Output Summary (Last 24 hours) at 09/14/2023 0754 Last data filed at 09/13/2023 2200 Gross per 24 hour  Intake 360 ml  Output 500 ml  Net -140 ml   Constitutional: well-appearing, no acute distress ENMT: ears and nose without scars or lesions, MMM CV: normal rate, no edema Respiratory: clear to auscultation, normal work of breathing.  On RA Gastrointestinal: soft, non-tender, no palpable masses or hernias Skin: no visible lesions or rashes Psych: alert, judgement/insight appropriate, appropriate mood and affect No Foley   Test Results I personally reviewed new and old clinical labs and radiology tests Lab Results  Component Value Date   NA 141 09/14/2023   K 4.2 09/14/2023   CL 99 09/14/2023   CO2 28 09/14/2023   BUN 58 (H) 09/14/2023   CREATININE 2.78 (H) 09/14/2023   GFR 23.23 (L) 07/20/2023   GLU 103 08/18/2023   CALCIUM  8.9 09/14/2023   ALBUMIN  2.6 (L) 09/09/2023    CBC Recent Labs  Lab 09/07/23 2019 09/08/23 0639 09/12/23 0256 09/13/23 0239 09/14/23 0229  WBC 15.2*   < > 7.7 8.1 8.9  NEUTROABS 11.8*  --   --   --   --   HGB 10.6*   < > 9.5* 9.9* 10.4*  HCT 32.8*   < > 29.6* 31.0* 32.4*  MCV 88.9   < > 89.2 88.1 87.8  PLT 203   < > 191 228 230   < > = values in this interval not displayed.

## 2023-09-14 NOTE — Plan of Care (Signed)
  Problem: Education: Goal: Knowledge of General Education information will improve Description: Including pain rating scale, medication(s)/side effects and non-pharmacologic comfort measures Outcome: Adequate for Discharge   Problem: Health Behavior/Discharge Planning: Goal: Ability to manage health-related needs will improve Outcome: Adequate for Discharge   Problem: Clinical Measurements: Goal: Ability to maintain clinical measurements within normal limits will improve Outcome: Adequate for Discharge Goal: Will remain free from infection Outcome: Adequate for Discharge Goal: Diagnostic test results will improve Outcome: Adequate for Discharge Goal: Respiratory complications will improve Outcome: Adequate for Discharge Goal: Cardiovascular complication will be avoided Outcome: Adequate for Discharge   Problem: Activity: Goal: Risk for activity intolerance will decrease Outcome: Adequate for Discharge   Problem: Nutrition: Goal: Adequate nutrition will be maintained Outcome: Adequate for Discharge   Problem: Coping: Goal: Level of anxiety will decrease Outcome: Adequate for Discharge   Problem: Elimination: Goal: Will not experience complications related to bowel motility Outcome: Adequate for Discharge Goal: Will not experience complications related to urinary retention Outcome: Adequate for Discharge   Problem: Pain Managment: Goal: General experience of comfort will improve and/or be controlled Outcome: Adequate for Discharge   Problem: Safety: Goal: Ability to remain free from injury will improve Outcome: Adequate for Discharge   Problem: Skin Integrity: Goal: Risk for impaired skin integrity will decrease Outcome: Adequate for Discharge   Problem: Education: Goal: Ability to demonstrate management of disease process will improve Outcome: Adequate for Discharge Goal: Ability to verbalize understanding of medication therapies will improve Outcome: Adequate  for Discharge Goal: Individualized Educational Video(s) Outcome: Adequate for Discharge   Problem: Activity: Goal: Capacity to carry out activities will improve Outcome: Adequate for Discharge   Problem: Cardiac: Goal: Ability to achieve and maintain adequate cardiopulmonary perfusion will improve Outcome: Adequate for Discharge   Problem: Activity: Goal: Ability to tolerate increased activity will improve Outcome: Adequate for Discharge   Problem: Clinical Measurements: Goal: Ability to maintain a body temperature in the normal range will improve Outcome: Adequate for Discharge   Problem: Respiratory: Goal: Ability to maintain adequate ventilation will improve Outcome: Adequate for Discharge Goal: Ability to maintain a clear airway will improve Outcome: Adequate for Discharge

## 2023-09-14 NOTE — Progress Notes (Signed)
 Mobility Specialist Progress Note;   09/14/23 0925  Mobility  Activity Ambulated with assistance  Level of Assistance Standby assist, set-up cues, supervision of patient - no hands on  Assistive Device Four wheel walker  Distance Ambulated (ft) 150 ft  Activity Response Tolerated well  Mobility Referral Yes  Mobility visit 1 Mobility  Mobility Specialist Start Time (ACUTE ONLY) 0925  Mobility Specialist Stop Time (ACUTE ONLY) 0934  Mobility Specialist Time Calculation (min) (ACUTE ONLY) 9 min   Pt eager for mobility. Required no physical assistance during ambulation, SV. Ambulated on RA, VSS throughout. HR up to 126 bpm w/ activity. Took 1x standing rest break d/t fatigue. Pt returned back to bed and left with all needs met, call bell in reach.   Lauraine Erm Mobility Specialist Please contact via SecureChat or [employer] [phone]

## 2023-09-14 NOTE — Progress Notes (Signed)
Daughter notified of pt's discharge.

## 2023-09-15 ENCOUNTER — Telehealth: Payer: Self-pay

## 2023-09-15 NOTE — Patient Instructions (Signed)
 Visit Information  Thank you for taking time to visit with me today. Please don't hesitate to contact me if I can be of assistance to you before our next scheduled telephone appointment.  Our next appointment is by telephone on 09/23/23 at 0900  Following is a copy of your care plan:   Goals Addressed             This Visit's Progress    [facility] Transitions of Care (TOC) Care Plan       Problems:  Recent Hospitalization for treatment of Sepsis due to community-acquired pneumonia Knowledge Deficit Related to Sepsis due to community-acquired pneumonia and heart failure  09/15/23 Spoke with daughter Leeroy. She reports patient is better but still weak from hospitalization.  Med review complete. Patient is weighing daily.  Reviewed pneumonia and heart failure.  Follow up appointments scheduled.  Discussed 30 day TOC program. She feels patient will benefit from education and support related to diet education in managing heart failure.  She is agreeable to 30 day program.   Goal:  Over the next 30 days, the patient will not experience hospital readmission  Interventions:  Transitions of Care: Doctor Visits  - discussed the importance of doctor visits Reviewed Signs and symptoms of infection Discussed getting life alert. Advised to call Medicare Advantage plan to inquire about life alert providers.  Heart Failure Interventions: Provided education on low sodium diet Provided education about placing scale on hard, flat surface Discussed importance of daily weight and advised patient to weigh and record daily Discussed the importance of keeping all appointments with provider  Patient Self Care Activities:  Call pharmacy for medication refills 3-7 days in advance of running out of medications Call provider office for new concerns or questions  Notify RN Care Manager of TOC call rescheduling needs Participate in Transition of Care Program/Attend TOC scheduled calls Take medications as  prescribed   call office if I gain more than 2 pounds in one day or 5 pounds in one week use salt in moderation watch for swelling in feet, ankles and legs every day weigh myself daily  Plan:  The patient has been provided with contact information for the care management team and has been advised to call with any health related questions or concerns.         Patient verbalizes understanding of instructions and care plan provided today and agrees to view in MyChart. Active MyChart status and patient understanding of how to access instructions and care plan via MyChart confirmed with patient.     The patient has been provided with contact information for the care management team and has been advised to call with any health related questions or concerns.   Please call the care guide team at [phone] if you need to cancel or reschedule your appointment.   Please call the Suicide and Crisis Lifeline: 988 if you are experiencing a Mental Health or Behavioral Health Crisis or need someone to talk to.  Alesi Zachery J. Kipper Buch RN, MSN [facility], [facility] RN Care Manager Direct Dial: [phone]  Fax: [phone] Website: [email or web address]

## 2023-09-15 NOTE — Transitions of Care (Post Inpatient/ED Visit) (Signed)
 09/15/2023  Name: [name] MRN: [id number] DOB: 07-Oct-1940  Today's TOC FU Call Status: Today's TOC FU Call Status:: Successful TOC FU Call Completed TOC FU Call Complete Date: 09/15/23 Patient's Name and Date of Birth confirmed.  Transition Care Management Follow-up Telephone Call Date of Discharge: 09/14/23 Discharge Facility: [facility]) Type of Discharge: Inpatient Admission Primary Inpatient Discharge Diagnosis:: Sepsis due to community-acquired pneumonia How have you been since you were released from the hospital?: Better Any questions or concerns?: No  Items Reviewed: Did you receive and understand the discharge instructions provided?: Yes Medications obtained,verified, and reconciled?: Yes (Medications Reviewed) Any new allergies since your discharge?: No Dietary orders reviewed?: Yes Type of Diet Ordered:: LowSodium and heart Healthy Do you have support at home?: Yes People in Home [RPT]: child(ren), adult Name of Support/Comfort Primary Source: Leeroy  Medications Reviewed Today: Medications Reviewed Today     Reviewed by Brittnee Gaetano, RN (Case Manager) on 09/15/23 at [id number]  Med List Status: <None>   Medication Order Taking? Sig Documenting Provider Last Dose Status Informant  acetaminophen  (TYLENOL ) 500 MG tablet [id number] Yes Take 1,000 mg by mouth daily as needed for moderate pain (pain score 4-6) or mild pain (pain score 1-3). [provider]  Active Child, Pharmacy Records  albuterol  (VENTOLIN  HFA) [phone] Base) MCG/ACT inhaler [id number] Yes Inhale 2 puffs into the lungs every 6 (six) hours as needed for wheezing or shortness of breath. Hunsucker, Donnice SAUNDERS, MD  Active Child, Pharmacy Records  amLODipine  (NORVASC ) 5 MG tablet [id number] Yes TAKE 1 TABLET BY MOUTH DAILY Theophilus Andrews, Tully GRADE, MD  Active Child, Pharmacy Records  apixaban  (ELIQUIS ) 2.5 MG TABS tablet [id number]  Take 1 tablet (2.5 mg total) by mouth 2 (two) times daily.  Jonel Lonni SQUIBB, MD  Active   clopidogrel  (PLAVIX ) 75 MG tablet [id number] Yes TAKE 1 TABLET BY MOUTH ONCE  DAILY Burnard Debby LABOR, MD  Active Child, Pharmacy Records           Med Note (CRUTHIS, CHLOE C   Wed Sep 08, 2023  8:11 AM) Has been holding for procedure.   Coenzyme Q10 (CO Q-10) 300 MG CAPS [id number] Yes Take 300 mg by mouth every evening. [provider]  Active Child, Pharmacy Records  cyanocobalamin  (VITAMIN B12) 1000 MCG/ML injection [id number] Yes Inject 1 mL (1,000 mcg total) into the muscle every 30 (thirty) days. Theophilus Andrews, Tully GRADE, MD  Active Child, Pharmacy Records  cyclobenzaprine  (FLEXERIL ) 5 MG tablet [id number] Yes Take 1 tablet (5 mg total) by mouth at bedtime as needed for muscle spasms. Theophilus Andrews, Tully GRADE, MD  Active Child, Pharmacy Records           Med Note (CRUTHIS, SHEFFIELD JAYSON [place] Sep 08, 2023  7:14 AM)    ezetimibe  (ZETIA ) 10 MG tablet [id number] Yes TAKE 1 TABLET BY MOUTH DAILY Theophilus Andrews, Tully GRADE, MD  Active Child, Pharmacy Records  ferrous sulfate 325 (65 FE) MG EC tablet [id number] Yes Take 325 mg by mouth daily. [provider]  Active Child, Pharmacy Records  fluticasone  (FLONASE ) 50 MCG/ACT nasal spray [id number] Yes Place 1 spray into both nostrils daily. [provider]  Active Child, Pharmacy Records  Fluticasone -Umeclidin-Vilant (TRELEGY ELLIPTA ) 200-62.5-25 MCG/ACT AEPB [id number] Yes Inhale 1 puff into the lungs daily. Hunsucker, Donnice SAUNDERS, MD  Active Child, Pharmacy Records  furosemide  (LASIX ) 20 MG tablet [id number] Yes Take 1 tablet (20 mg total) by mouth every other day. Danford,  Lonni SQUIBB, MD  Active   isosorbide  mononitrate (IMDUR ) 30 MG 24 hr tablet [id number] Yes Take 1 tablet (30 mg total) by mouth daily. Jonel Lonni SQUIBB, MD  Active   Menthol, Topical Analgesic, [facility] EX) [id number] Yes Apply 1 application topically daily as needed (pain). [provider]  Active Child, Pharmacy  Records  Multiple Vitamin (MULTIVITAMIN) tablet [id number] Yes Take 1 tablet by mouth daily. [provider]  Active Child, Pharmacy Records  nebivolol  (BYSTOLIC ) 10 MG tablet [id number] Yes Take 0.5 tablets (5 mg total) by mouth daily. Jonel Lonni SQUIBB, MD  Active   nitroGLYCERIN  (NITROSTAT ) 0.4 MG SL tablet [id number] Yes PLACE 1 TABLET UNDER THE TONGUE EVERY 5 MINUTES AS NEEDED FOR CHEST PAIN Theophilus Andrews, Tully GRADE, MD  Active Child, Pharmacy Records           Med Note (CRUTHIS, CHLOE C   Wed Sep 08, 2023  8:25 AM) Pt took three doses of this medication on 09/08/23.   Nystatin (GERHARDT'S BUTT CREAM) CREA [id number] Yes Apply 1 Application topically daily. Danford, Lonni SQUIBB, MD  Active   oxyCODONE -acetaminophen  (PERCOCET) 10-325 MG tablet [id number] Yes Take 1 tablet by mouth 2 (two) times daily. Theophilus Andrews, Tully GRADE, MD  Active Child, Pharmacy Records  pantoprazole  (PROTONIX ) 40 MG tablet [id number] Yes Take 1 tablet (40 mg total) by mouth daily.  Patient taking differently: Take 40 mg by mouth every evening.   Craig Alan SAUNDERS, PA-C  Active Child, Pharmacy Records  prednisoLONE acetate (PRED FORTE) 1 % ophthalmic suspension [id number]  Place 1 drop into both eyes 4 (four) times daily. [provider]  Active Child, Pharmacy Records           Med Note (CRUTHIS, CHLOE C   Wed Sep 08, 2023  8:24 AM) Per daughter the pt randomly took this medication out of his medication box and decided to start taking it again on 09/07/23. This prescription is from December of 2024 per daughter. No fill hx found.   rOPINIRole  (REQUIP ) 2 MG tablet [id number]  TAKE 1 TABLET BY MOUTH AT  BEDTIME  Patient taking differently: Take 2 mg by mouth every evening.   Theophilus Andrews, Tully GRADE, MD  Active Child, Pharmacy Records  rosuvastatin  (CRESTOR ) 10 MG tablet [id number] Yes TAKE 1 TABLET BY MOUTH DAILY Burnard Debby LABOR, MD  Active Child, Pharmacy Records  saccharomyces boulardii  [facility]) 250 MG capsule [id number] Yes Take 250 mg by mouth every evening. [provider]  Active Child, Pharmacy Records  sodium zirconium cyclosilicate  (LOKELMA ) 5 g packet [id number] Yes Take 5 g by mouth 2 (two) times daily. [provider]  Active Child, Pharmacy Records  venlafaxine  (EFFEXOR ) 100 MG tablet [id number] Yes Take 1 tablet (100 mg total) by mouth daily. Theophilus Andrews, Tully GRADE, MD  Active Child, Pharmacy Records           Med Note (CRUTHIS, CHLOE C   Wed Sep 08, 2023  7:14 AM)              Home Care and Equipment/Supplies: Were Home Health Services Ordered?: No Any new equipment or medical supplies ordered?: Yes Name of Medical supply agency?: [employer] Were you able to get the equipment/medical supplies?: Yes (Patient received a rollator) Do you have any questions related to the use of the equipment/supplies?: No  Functional Questionnaire: Do you need assistance with bathing/showering or dressing?: No Do you need assistance with meal preparation?: No Do you need assistance with eating?: No  Do you have difficulty maintaining continence: No Do you need assistance with getting out of bed/getting out of a chair/moving?: No Do you have difficulty managing or taking your medications?: Yes  Follow up appointments reviewed: PCP Follow-up appointment confirmed?: No (Daughter to call) MD Provider Line Number:[phone] Given: No Specialist Hospital Follow-up appointment confirmed?: Yes Date of Specialist follow-up appointment?: 09/17/23 Follow-Up Specialty Provider:: Heart Do you need transportation to your follow-up appointment?: No Do you understand care options if your condition(s) worsen?: Yes-patient verbalized understanding  SDOH Interventions Today    Flowsheet Row Most Recent Value  SDOH Interventions   Food Insecurity Interventions Intervention Not Indicated  Housing Interventions Intervention Not Indicated  Transportation Interventions  Intervention Not Indicated  Utilities Interventions Intervention Not Indicated    Goals Addressed             This Visit's Progress    [facility] Transitions of Care (TOC) Care Plan       Problems:  Recent Hospitalization for treatment of Sepsis due to community-acquired pneumonia Knowledge Deficit Related to Sepsis due to community-acquired pneumonia and heart failure  09/15/23 Spoke with daughter Leeroy. She reports patient is better but still weak from hospitalization.  Med review complete. Patient is weighing daily.  Reviewed pneumonia and heart failure.  Follow up appointments scheduled.  Discussed 30 day TOC program. She feels patient will benefit from education and support related to diet education in managing heart failure.  She is agreeable to 30 day program.   Goal:  Over the next 30 days, the patient will not experience hospital readmission  Interventions:  Transitions of Care: Doctor Visits  - discussed the importance of doctor visits Reviewed Signs and symptoms of infection Discussed getting life alert. Advised to call Medicare Advantage plan to inquire about life alert providers.  Heart Failure Interventions: Provided education on low sodium diet Provided education about placing scale on hard, flat surface Discussed importance of daily weight and advised patient to weigh and record daily Discussed the importance of keeping all appointments with provider  Patient Self Care Activities:  Call pharmacy for medication refills 3-7 days in advance of running out of medications Call provider office for new concerns or questions  Notify RN Care Manager of TOC call rescheduling needs Participate in Transition of Care Program/Attend TOC scheduled calls Take medications as prescribed   call office if I gain more than 2 pounds in one day or 5 pounds in one week use salt in moderation watch for swelling in feet, ankles and legs every day weigh myself daily  Plan:  The patient has  been provided with contact information for the care management team and has been advised to call with any health related questions or concerns.         Theophile Harvie J. Ahmya Bernick RN, MSN [facility], [facility] RN Care Manager Direct Dial: [phone]  Fax: [phone] Website: [email or web address]

## 2023-09-16 ENCOUNTER — Encounter: Payer: Self-pay | Admitting: Internal Medicine

## 2023-09-16 ENCOUNTER — Telehealth (HOSPITAL_COMMUNITY): Payer: Self-pay

## 2023-09-16 ENCOUNTER — Ambulatory Visit: Admitting: Internal Medicine

## 2023-09-16 VITALS — BP 120/64 | HR 110 | Temp 98.4°F | Wt 150.0 lb

## 2023-09-16 DIAGNOSIS — Z8701 Personal history of pneumonia (recurrent): Secondary | ICD-10-CM | POA: Diagnosis not present

## 2023-09-16 DIAGNOSIS — I483 Typical atrial flutter: Secondary | ICD-10-CM

## 2023-09-16 DIAGNOSIS — N184 Chronic kidney disease, stage 4 (severe): Secondary | ICD-10-CM | POA: Diagnosis not present

## 2023-09-16 DIAGNOSIS — I2510 Atherosclerotic heart disease of native coronary artery without angina pectoris: Secondary | ICD-10-CM

## 2023-09-16 DIAGNOSIS — G894 Chronic pain syndrome: Secondary | ICD-10-CM

## 2023-09-16 DIAGNOSIS — J189 Pneumonia, unspecified organism: Secondary | ICD-10-CM

## 2023-09-16 DIAGNOSIS — Z09 Encounter for follow-up examination after completed treatment for conditions other than malignant neoplasm: Secondary | ICD-10-CM

## 2023-09-16 LAB — CBC WITH DIFFERENTIAL/PLATELET
Basophils Absolute: 0.1 K/uL (ref 0.0–0.1)
Basophils Relative: 1 % (ref 0.0–3.0)
Eosinophils Absolute: 0.3 K/uL (ref 0.0–0.7)
Eosinophils Relative: 2.9 % (ref 0.0–5.0)
HCT: 33.4 % — ABNORMAL LOW (ref 39.0–52.0)
Hemoglobin: 11 g/dL — ABNORMAL LOW (ref 13.0–17.0)
Lymphocytes Relative: 19.4 % (ref 12.0–46.0)
Lymphs Abs: 2.1 K/uL (ref 0.7–4.0)
MCHC: 33 g/dL (ref 30.0–36.0)
MCV: 86.2 fl (ref 78.0–100.0)
Monocytes Absolute: 1 K/uL (ref 0.1–1.0)
Monocytes Relative: 8.9 % (ref 3.0–12.0)
Neutro Abs: 7.3 K/uL (ref 1.4–7.7)
Neutrophils Relative %: 67.8 % (ref 43.0–77.0)
Platelets: 315 K/uL (ref 150.0–400.0)
RBC: 3.88 Mil/uL — ABNORMAL LOW (ref 4.22–5.81)
RDW: 15.1 % (ref 11.5–15.5)
WBC: 10.8 K/uL — ABNORMAL HIGH (ref 4.0–10.5)

## 2023-09-16 LAB — BASIC METABOLIC PANEL WITH GFR
BUN: 66 mg/dL — ABNORMAL HIGH (ref 6–23)
CO2: 31 meq/L (ref 19–32)
Calcium: 9.4 mg/dL (ref 8.4–10.5)
Chloride: 100 meq/L (ref 96–112)
Creatinine, Ser: 3.36 mg/dL — ABNORMAL HIGH (ref 0.40–1.50)
GFR: 16.51 mL/min — ABNORMAL LOW (ref >60.00)
Glucose, Bld: 148 mg/dL — ABNORMAL HIGH (ref 70–99)
Potassium: 5.3 meq/L — ABNORMAL HIGH (ref 3.5–5.1)
Sodium: 143 meq/L (ref 135–145)

## 2023-09-16 MED ORDER — OXYCODONE-ACETAMINOPHEN 10-325 MG PO TABS: 1.0000 | ORAL_TABLET | Freq: Two times a day (BID) | ORAL | 0 refills | Status: DC

## 2023-09-16 MED ORDER — NITROGLYCERIN 0.4 MG SL SUBL: SUBLINGUAL_TABLET | SUBLINGUAL | 0 refills | Status: AC

## 2023-09-16 NOTE — Progress Notes (Signed)
 Hospital follow-up visit     CC/Reason for Visit: Hospitalization follow-up  HPI: [name] is a 83 y.o. male who is coming in today for the above mentioned reasons, specifically transitional care services face-to-face visit.    Dates hospitalized: 09/07/2023-09/14/2023 Days since discharge from hospital: 2 Patient was discharged from the hospital to: Home Reason for admission to hospital: Sepsis due to community-acquired pneumonia Date of interactive phone contact with patient and/or caregiver: 09/15/2023  I have reviewed in detail patient's discharge summary plus pertinent specific notes, labs, and images from the hospitalization.  Yes  Patient was admitted due to dyspnea on exertion, fatigue and chills.  He was found to have community-acquired pneumonia with a temp of 103.5 and signs of sepsis.  He was admitted on IV antibiotics.  During that hospitalization he was found to have new onset a flutter with signs of acute congestive heart failure with mildly reduced EF.  He was discharged on furosemide  and has follow-up with cardiology scheduled.  He is on Eliquis .  On chest CT they were unable to rule out malignancy.  He has outpatient follow-up scheduled with pulmonology on September 9.  He has stage IV chronic kidney disease, has follow-up scheduled with pulmonary and is due to have labs today.  He is requesting refills of his oxycodone .  He has a history of esophageal stenosis and was having some difficulty swallowing.  They we will schedule a future appointment with GI for dilatation once other acute issues are better controlled.  Here with his daughter today.  Medication reconciliation was done today and patient is taking meds as recommended by discharging hospitalist/specialist.  Yes   Past Medical/Surgical History: Past Medical History:  Diagnosis Date   Allergy     Anxiety    B12 deficiency anemia    Blood transfusion without reported diagnosis    CAD (coronary artery  disease)    Cancer (HCC)    bladder, Right renal cell also   Chronic kidney disease    Colon polyps    COPD (chronic obstructive pulmonary disease) (HCC)    Depression    Diabetes mellitus (HCC)    Dyspnea    Esophagus, Barrett's    GERD (gastroesophageal reflux disease)    Headache    History of bladder cancer    Bladder cancer 8 times   History of hiatal hernia    Hyperlipidemia    Hypertension    Localized osteoarthrosis, lower leg    Myocardial infarction (HCC) 2021   Pneumonia    Restless leg syndrome    Sleep apnea    does not wear cpap   Stenosis of esophagus     Past Surgical History:  Procedure Laterality Date   ABDOMINAL AORTOGRAM N/A 08/03/2023   Procedure: ABDOMINAL AORTOGRAM;  Surgeon: Serene Gaile LELON, MD;  [facility];  Service: Cardiovascular;  Laterality: N/A;   BILIARY BRUSHING  04/01/2018   Procedure: BILIARY BRUSHING;  Surgeon: Wilhelmenia Aloha Raddle., MD;  Location: [facility] ENDOSCOPY;  Service: Gastroenterology;;   BILIARY BRUSHING  09/12/2018   Procedure: BILIARY BRUSHING;  Surgeon: Wilhelmenia Aloha Raddle., MD;  Location: [facility] ENDOSCOPY;  Service: Gastroenterology;;   BILIARY BRUSHING  11/28/2018   Procedure: BILIARY BRUSHING;  Surgeon: Wilhelmenia Aloha Raddle., MD;  Location: [facility] ENDOSCOPY;  Service: Gastroenterology;;   BILIARY BRUSHING  03/11/2020   Procedure: BILIARY BRUSHING;  Surgeon: Wilhelmenia Aloha Raddle., MD;  Location: [facility] ENDOSCOPY;  Service: Gastroenterology;;   BILIARY DILATION  09/12/2018  Procedure: BILIARY DILATION;  Surgeon: Wilhelmenia Aloha Raddle., MD;  Location: [facility] ENDOSCOPY;  Service: Gastroenterology;;   BILIARY DILATION  11/28/2018   Procedure: BILIARY DILATION;  Surgeon: Wilhelmenia Aloha Raddle., MD;  Location: [facility] ENDOSCOPY;  Service: Gastroenterology;;   BILIARY DILATION  03/11/2020   Procedure: BILIARY DILATION;  Surgeon: Wilhelmenia Aloha Raddle., MD;  Location: [facility]OSCOPY;  Service: Gastroenterology;;   BILIARY  STENT PLACEMENT  04/01/2018   Procedure: BILIARY STENT PLACEMENT;  Surgeon: Wilhelmenia Aloha Raddle., MD;  Location: [facility] ENDOSCOPY;  Service: Gastroenterology;;   BILIARY STENT PLACEMENT  09/12/2018   Procedure: BILIARY STENT PLACEMENT;  Surgeon: Wilhelmenia Aloha Raddle., MD;  Location: [facility] ENDOSCOPY;  Service: Gastroenterology;;   BILIARY STENT PLACEMENT  11/28/2018   Procedure: BILIARY STENT PLACEMENT;  Surgeon: Wilhelmenia Aloha Raddle., MD;  Location: [facility] ENDOSCOPY;  Service: Gastroenterology;;   BIOPSY  04/01/2018   Procedure: BIOPSY;  Surgeon: Wilhelmenia Aloha Raddle., MD;  Location: [facility] ENDOSCOPY;  Service: Gastroenterology;;   BIOPSY  09/12/2018   Procedure: BIOPSY;  Surgeon: Wilhelmenia Aloha Raddle., MD;  Location: [facility] ENDOSCOPY;  Service: Gastroenterology;;   BIOPSY  11/28/2018   Procedure: BIOPSY;  Surgeon: Wilhelmenia Aloha Raddle., MD;  Location: [facility] ENDOSCOPY;  Service: Gastroenterology;;   BIOPSY  03/11/2020   Procedure: BIOPSY;  Surgeon: Wilhelmenia Aloha Raddle., MD;  Location: [facility] ENDOSCOPY;  Service: Gastroenterology;;   bladder cancer      x 8 cystoscopy   CERVICAL DISCECTOMY     ACDF   CHOLECYSTECTOMY     COLONOSCOPY  11/17/2005   normal    CORONARY ARTERY BYPASS GRAFT     x4   CORONARY STENT INTERVENTION N/A 01/05/2019   Procedure: CORONARY STENT INTERVENTION;  Surgeon: Mady Bruckner, MD;  [facility]: [facility];  Service: Cardiovascular;  Laterality: N/A;   CYSTOSCOPY WITH BIOPSY N/A 03/27/2022   Procedure: CYSTOSCOPY WITH BIOPSY;  Surgeon: Watt Rush, MD;  Location: [facility] ORS;  Service: Urology;  Laterality: N/A;   CYSTOSCOPY WITH RETROGRADE PYELOGRAM, URETEROSCOPY AND STENT PLACEMENT Right 03/27/2022   Procedure: CYSTOSCOPY WITH RIGHT RETROGRADE PYELOGRAM, URETEROSCOPY AND STENT PLACEMENT urethral dilation;  Surgeon: Watt Rush, MD;  Location: [facility] ORS;  Service: Urology;  Laterality: Right;   ENDOSCOPIC MUCOSAL RESECTION  09/12/2018   Procedure: ENDOSCOPIC MUCOSAL RESECTION;   Surgeon: Wilhelmenia Aloha Raddle., MD;  Location: [facility] ENDOSCOPY;  Service: Gastroenterology;;   ENDOSCOPIC RETROGRADE CHOLANGIOPANCREATOGRAPHY (ERCP) WITH PROPOFOL  N/A 04/01/2018   Procedure: ENDOSCOPIC RETROGRADE CHOLANGIOPANCREATOGRAPHY (ERCP) WITH PROPOFOL ;  Surgeon: Wilhelmenia Aloha Raddle., MD;  Location: [facility] ENDOSCOPY;  Service: Gastroenterology;  Laterality: N/A;   ENDOSCOPIC RETROGRADE CHOLANGIOPANCREATOGRAPHY (ERCP) WITH PROPOFOL  N/A 09/12/2018   Procedure: ENDOSCOPIC RETROGRADE CHOLANGIOPANCREATOGRAPHY (ERCP) WITH PROPOFOL ;  Surgeon: Wilhelmenia Aloha Raddle., MD;  Location: [facility] ENDOSCOPY;  Service: Gastroenterology;  Laterality: N/A;   ENDOSCOPIC RETROGRADE CHOLANGIOPANCREATOGRAPHY (ERCP) WITH PROPOFOL  N/A 03/11/2020   Procedure: ENDOSCOPIC RETROGRADE CHOLANGIOPANCREATOGRAPHY (ERCP) WITH PROPOFOL ;  Surgeon: Wilhelmenia Aloha Raddle., MD;  Location: [facility] ENDOSCOPY;  Service: Gastroenterology;  Laterality: N/A;   ENDOSCOPIC RETROGRADE CHOLANGIOPANCREATOGRAPHY (ERCP) WITH PROPOFOL  N/A 06/03/2020   Procedure: ENDOSCOPIC RETROGRADE CHOLANGIOPANCREATOGRAPHY (ERCP) WITH PROPOFOL ;  Surgeon: Wilhelmenia Aloha Raddle., MD;  Location: [facility] ENDOSCOPY;  Service: Gastroenterology;  Laterality: N/A;   ERCP N/A 11/28/2018   Procedure: ENDOSCOPIC RETROGRADE CHOLANGIOPANCREATOGRAPHY (ERCP) +EGD with spyglass;  Surgeon: Wilhelmenia Aloha Raddle., MD;  Location: [facility] ENDOSCOPY;  Service: Gastroenterology;  Laterality: N/A;   ESOPHAGOGASTRODUODENOSCOPY  04/29/2010   ESOPHAGOGASTRODUODENOSCOPY (EGD) WITH PROPOFOL  N/A 04/01/2018   Procedure: ESOPHAGOGASTRODUODENOSCOPY (EGD) WITH PROPOFOL ;  Surgeon: Wilhelmenia Aloha Raddle., MD;  Location[facility] ENDOSCOPY;  Service: Gastroenterology;  Laterality: N/A;   ESOPHAGOGASTRODUODENOSCOPY (EGD) WITH PROPOFOL  N/A 09/12/2018   Procedure: ESOPHAGOGASTRODUODENOSCOPY (EGD) WITH PROPOFOL ;  Surgeon: Wilhelmenia Aloha Raddle., MD;  Location: [facility] ENDOSCOPY;  Service: Gastroenterology;  Laterality: N/A;    ESOPHAGOGASTRODUODENOSCOPY (EGD) WITH PROPOFOL  N/A 11/28/2018   Procedure: ESOPHAGOGASTRODUODENOSCOPY (EGD) WITH PROPOFOL ;  Surgeon: Wilhelmenia Aloha Raddle., MD;  Location: [facility] ENDOSCOPY;  Service: Gastroenterology;  Laterality: N/A;   ESOPHAGOGASTRODUODENOSCOPY (EGD) WITH PROPOFOL  N/A 03/11/2020   Procedure: ESOPHAGOGASTRODUODENOSCOPY (EGD) WITH PROPOFOL ;  Surgeon: Wilhelmenia Aloha Raddle., MD;  Location: [facility] ENDOSCOPY;  Service: Gastroenterology;  Laterality: N/A;   ESOPHAGOGASTRODUODENOSCOPY (EGD) WITH PROPOFOL  N/A 06/03/2020   Procedure: ESOPHAGOGASTRODUODENOSCOPY (EGD) WITH PROPOFOL ;  Surgeon: Wilhelmenia Aloha Raddle., MD;  Location: [facility] ENDOSCOPY;  Service: Gastroenterology;  Laterality: N/A;   EUS  04/01/2018   Procedure: FULL UPPER ENDOSCOPIC ULTRASOUND (EUS) RADIAL;  Surgeon: Wilhelmenia Aloha Raddle., MD;  Location: [facility] ENDOSCOPY;  Service: Gastroenterology;;   EUS N/A 09/12/2018   Procedure: UPPER ENDOSCOPIC ULTRASOUND (EUS) RADIAL;  Surgeon: Wilhelmenia Aloha Raddle., MD;  Location: [facility] ENDOSCOPY;  Service: Gastroenterology;  Laterality: N/A;   FINE NEEDLE ASPIRATION  09/12/2018   Procedure: FINE NEEDLE ASPIRATION (FNA) LINEAR;  Surgeon: Wilhelmenia Aloha Raddle., MD;  Location: [facility] ENDOSCOPY;  Service: Gastroenterology;;   HAND SURGERY Left 2018   saw accident   HEMOSTASIS CLIP PLACEMENT  09/12/2018   Procedure: HEMOSTASIS CLIP PLACEMENT;  Surgeon: Wilhelmenia Aloha Raddle., MD;  Location: [facility] ENDOSCOPY;  Service: Gastroenterology;;   HEMOSTASIS CLIP PLACEMENT  06/03/2020   Procedure: HEMOSTASIS CLIP PLACEMENT;  Surgeon: Wilhelmenia Aloha Raddle., MD;  Location: [facility] ENDOSCOPY;  Service: Gastroenterology;;   I & D EXTREMITY Left 11/24/2016   Procedure: IRRIGATION AND DEBRIDEMENT LEFT HAND, THUMB, INDEX, MIDDLE, RING, AND SMALL FINGERS WITH RECONSTRUCTION;  Surgeon: Camella Fallow, MD;  Location: [facility];  Service: Orthopedics;  Laterality: Left;   KNEE ARTHROSCOPY Left    LEFT HEART CATH AND CORS/GRAFTS  ANGIOGRAPHY N/A 01/05/2019   Procedure: LEFT HEART CATH AND CORS/GRAFTS ANGIOGRAPHY;  Surgeon: Mady Bruckner, MD;  [facility]: [facility];  Service: Cardiovascular;  Laterality: N/A;   LUMBAR LAMINECTOMY     and fusion x 2   NASAL SINUS SURGERY     POPLITEAL SYNOVIAL CYST EXCISION     REMOVAL OF STONES  04/01/2018   Procedure: REMOVAL OF STONES;  Surgeon: Wilhelmenia Aloha Raddle., MD;  Location: [facility] ENDOSCOPY;  Service: Gastroenterology;;   REMOVAL OF STONES  09/12/2018   Procedure: REMOVAL OF STONES;  Surgeon: Wilhelmenia Aloha Raddle., MD;  Location: [facility] ENDOSCOPY;  Service: Gastroenterology;;   REMOVAL OF STONES  11/28/2018   Procedure: REMOVAL OF STONES;  Surgeon: Wilhelmenia Aloha Raddle., MD;  Location: [facility] ENDOSCOPY;  Service: Gastroenterology;;   REMOVAL OF STONES  03/11/2020   Procedure: REMOVAL OF STONES;  Surgeon: Wilhelmenia Aloha Raddle., MD;  Location: [facility] ENDOSCOPY;  Service: Gastroenterology;;   REMOVAL OF STONES  06/03/2020   Procedure: REMOVAL OF STONES;  Surgeon: Wilhelmenia Aloha Raddle., MD;  Location: [facility] ENDOSCOPY;  Service: Gastroenterology;;   ROBOT ASSITED LAPAROSCOPIC NEPHROURETERECTOMY Right 06/03/2022   Procedure: XI ROBOT ASSITED LAPAROSCOPIC NEPHROURETERECTOMY, FLEXIBLE CYSTOSCOPY;  Surgeon: Alvaro Ricardo KATHEE Raddle., MD;  Location: [facility] ORS;  Service: Urology;  Laterality: Right;  3 HRS   SAVORY DILATION N/A 09/12/2018   Procedure: SAVORY DILATION;  Surgeon: Wilhelmenia Aloha Raddle., MD;  Location: [facility] ENDOSCOPY;  Service: Gastroenterology;  Laterality: N/A;   SAVORY DILATION N/A 11/28/2018   Procedure: SAVORY DILATION;  Surgeon: Wilhelmenia Aloha  Mickey., MD;  Location: [facility] ENDOSCOPY;  Service: Gastroenterology;  Laterality: N/A;   SEPTOPLASTY Bilateral 05/26/2021   Procedure: SEPTOPLASTY;  Surgeon: Karis Clunes, MD;  Location:  SURGERY CENTER;  Service: ENT;  Laterality: Bilateral;   SPHINCTEROTOMY  04/01/2018   Procedure: ANNETT;  Surgeon: Mansouraty, Aloha Mickey., MD;  Location: [facility] ENDOSCOPY;  Service: Gastroenterology;;   LAHOMA CHOLANGIOSCOPY N/A 11/28/2018   Procedure: DEBHOJDD CHOLANGIOSCOPY;  Surgeon: Wilhelmenia Aloha Mickey., MD;  Location: [facility] ENDOSCOPY;  Service: Gastroenterology;  Laterality: N/A;   SPYGLASS CHOLANGIOSCOPY N/A 06/03/2020   Procedure: SPYGLASS CHOLANGIOSCOPY;  Surgeon: Wilhelmenia Aloha Mickey., MD;  Location: [facility] ENDOSCOPY;  Service: Gastroenterology;  Laterality: N/A;   STENT REMOVAL  09/12/2018   Procedure: STENT REMOVAL;  Surgeon: Wilhelmenia Aloha Mickey., MD;  Location: [facility] ENDOSCOPY;  Service: Gastroenterology;;   CLEDA REMOVAL  11/28/2018   Procedure: STENT REMOVAL;  Surgeon: Wilhelmenia Aloha Mickey., MD;  Location: [facility] ENDOSCOPY;  Service: Gastroenterology;;   CLEDA REMOVAL  03/11/2020   Procedure: STENT REMOVAL;  Surgeon: Wilhelmenia Aloha Mickey., MD;  Location: [facility] ENDOSCOPY;  Service: Gastroenterology;;   SUBMUCOSAL LIFTING INJECTION  09/12/2018   Procedure: SUBMUCOSAL LIFTING INJECTION;  Surgeon: Wilhelmenia Aloha Mickey., MD;  Location: [facility] ENDOSCOPY;  Service: Gastroenterology;;    Social History:  reports that he quit smoking about 47 years ago. His smoking use included cigarettes. He started smoking about 52 years ago. He has a 2.5 pack-year smoking history. He has never used smokeless tobacco. He reports that he does not drink alcohol and does not use drugs.  Allergies: No Known Allergies  Family History:  Family History  Problem Relation Age of Onset   Melanoma Mother    Stroke Father    Hypertension Father    Coronary artery disease Other    Colon cancer Neg Hx    Esophageal cancer Neg Hx    Stomach cancer Neg Hx    Rectal cancer Neg Hx    Pancreatic cancer Neg Hx    Liver disease Neg Hx    Inflammatory bowel disease Neg Hx      Current Outpatient Medications:    acetaminophen  (TYLENOL ) 500 MG tablet, Take 1,000 mg by mouth daily as needed for moderate pain (pain score 4-6) or mild pain (pain score  1-3)., Disp: , Rfl:    albuterol  (VENTOLIN  HFA) 108 (90 Base) MCG/ACT inhaler, Inhale 2 puffs into the lungs every 6 (six) hours as needed for wheezing or shortness of breath., Disp: 1 each, Rfl: 6   [Paused] amLODipine  (NORVASC ) 5 MG tablet, TAKE 1 TABLET BY MOUTH DAILY, Disp: 100 tablet, Rfl: 1   apixaban  (ELIQUIS ) 2.5 MG TABS tablet, Take 1 tablet (2.5 mg total) by mouth 2 (two) times daily., Disp: 180 tablet, Rfl: 0   clopidogrel  (PLAVIX ) 75 MG tablet, TAKE 1 TABLET BY MOUTH ONCE  DAILY, Disp: 100 tablet, Rfl: 2   Coenzyme Q10 (CO Q-10) 300 MG CAPS, Take 300 mg by mouth every evening., Disp: , Rfl:    cyanocobalamin  (VITAMIN B12) 1000 MCG/ML injection, Inject 1 mL (1,000 mcg total) into the muscle every 30 (thirty) days., Disp: 6 mL, Rfl: 11   cyclobenzaprine  (FLEXERIL ) 5 MG tablet, Take 1 tablet (5 mg total) by mouth at bedtime as needed for muscle spasms., Disp: 30 tablet, Rfl: 0   ezetimibe  (ZETIA ) 10 MG tablet, TAKE 1 TABLET BY MOUTH DAILY, Disp: 100 tablet, Rfl: 1   ferrous sulfate 325 (65 FE) MG EC tablet, Take 325 mg by mouth daily., Disp: ,  Rfl:    fluticasone  (FLONASE ) 50 MCG/ACT nasal spray, Place 1 spray into both nostrils daily., Disp: , Rfl:    Fluticasone -Umeclidin-Vilant (TRELEGY ELLIPTA ) 200-62.5-25 MCG/ACT AEPB, Inhale 1 puff into the lungs daily., Disp: 60 each, Rfl: 11   furosemide  (LASIX ) 20 MG tablet, Take 1 tablet (20 mg total) by mouth every other day., Disp: 15 tablet, Rfl: 0   isosorbide  mononitrate (IMDUR ) 30 MG 24 hr tablet, Take 1 tablet (30 mg total) by mouth daily., Disp: 90 tablet, Rfl: 0   Menthol, Topical Analgesic, (BENGAY EX), Apply 1 application topically daily as needed (pain)., Disp: , Rfl:    Multiple Vitamin (MULTIVITAMIN) tablet, Take 1 tablet by mouth daily., Disp: , Rfl:    nebivolol  (BYSTOLIC ) 10 MG tablet, Take 0.5 tablets (5 mg total) by mouth daily., Disp: 45 tablet, Rfl: 0   Nystatin (GERHARDT'S BUTT CREAM) CREA, Apply 1 Application topically  daily., Disp: 60 each, Rfl: 0   pantoprazole  (PROTONIX ) 40 MG tablet, Take 1 tablet (40 mg total) by mouth daily. (Patient taking differently: Take 40 mg by mouth every evening.), Disp: 100 tablet, Rfl: 3   prednisoLONE acetate (PRED FORTE) 1 % ophthalmic suspension, Place 1 drop into both eyes 4 (four) times daily., Disp: , Rfl:    rOPINIRole  (REQUIP ) 2 MG tablet, TAKE 1 TABLET BY MOUTH AT  BEDTIME (Patient taking differently: Take 2 mg by mouth every evening.), Disp: 100 tablet, Rfl: 1   rosuvastatin  (CRESTOR ) 10 MG tablet, TAKE 1 TABLET BY MOUTH DAILY, Disp: 90 tablet, Rfl: 2   saccharomyces boulardii (FLORASTOR) 250 MG capsule, Take 250 mg by mouth every evening., Disp: , Rfl:    sodium zirconium cyclosilicate  (LOKELMA ) 5 g packet, Take 5 g by mouth 2 (two) times daily., Disp: , Rfl:    venlafaxine  (EFFEXOR ) 100 MG tablet, Take 1 tablet (100 mg total) by mouth daily., Disp: 90 tablet, Rfl: 1   nitroGLYCERIN  (NITROSTAT ) 0.4 MG SL tablet, PLACE 1 TABLET UNDER THE TONGUE EVERY 5 MINUTES AS NEEDED FOR CHEST PAIN, Disp: 25 tablet, Rfl: 0   oxyCODONE -acetaminophen  (PERCOCET) 10-325 MG tablet, Take 1 tablet by mouth 2 (two) times daily., Disp: 60 tablet, Rfl: 0   oxyCODONE -acetaminophen  (PERCOCET) 10-325 MG tablet, Take 1 tablet by mouth 2 (two) times daily., Disp: 60 tablet, Rfl: 0   oxyCODONE -acetaminophen  (PERCOCET) 10-325 MG tablet, Take 1 tablet by mouth 2 (two) times daily., Disp: 60 tablet, Rfl: 0  Review of Systems:  Negative except as mentioned in HPI.    Physical Exam: Vitals:   09/16/23 1133  BP: 120/64  Pulse: (!) 110  Temp: 98.4 F (36.9 C)  TempSrc: Oral  SpO2: 96%  Weight: 150 lb (68 kg)    Body mass index is 22.15 kg/m.   Physical Exam Vitals reviewed.  Constitutional:      Appearance: Normal appearance.  HENT:     Head: Normocephalic and atraumatic.  Eyes:     Conjunctiva/sclera: Conjunctivae normal.  Cardiovascular:     Rate and Rhythm: Tachycardia present.  Rhythm irregular.  Pulmonary:     Effort: Pulmonary effort is normal.     Breath sounds: Normal breath sounds.  Skin:    General: Skin is warm and dry.  Neurological:     General: No focal deficit present.     Mental Status: He is alert and oriented to person, place, and time.  Psychiatric:        Mood and Affect: Mood normal.  Behavior: Behavior normal.        Thought Content: Thought content normal.        Judgment: Judgment normal.     Impression and Plan:  CKD (chronic kidney disease), stage IV (HCC) - Plan: Basic Metabolic Panel, Basic Metabolic Panel  Chronic pain syndrome - Plan: oxyCODONE -acetaminophen  (PERCOCET) 10-325 MG tablet, oxyCODONE -acetaminophen  (PERCOCET) 10-325 MG tablet, oxyCODONE -acetaminophen  (PERCOCET) 10-325 MG tablet  Coronary artery disease involving native coronary artery of native heart without angina pectoris - Plan: nitroGLYCERIN  (NITROSTAT ) 0.4 MG SL tablet, CBC with Differential/Platelet, CBC with Differential/Platelet  Hospital discharge follow-up  Community acquired pneumonia, unspecified laterality  Typical atrial flutter [facility])  -Hospital charts have been reviewed in great detail. - He feels like he is close to baseline in regards to his dyspnea on exertion. - Has follow-up already scheduled with cardiology and with pulmonary. - CBC and BMP will be ordered today as requested by discharging hospitalist. - A1c is stable at 5.7.  Medical decision making of high complexity was utilized today.      Tully Theophilus Andrews, MD [place]

## 2023-09-16 NOTE — Telephone Encounter (Signed)
 Called to confirm/remind patient of their appointment [facility] on 09/17/23.   Appointment:   [x] Confirmed  [] Left mess   [] No answer/No voice mail  [] VM Full/unable to leave message  [] Phone not in service  Patient reminded to bring all medications and/or complete list.  Confirmed patient has transportation. Gave directions, instructed to utilize valet parking.

## 2023-09-17 ENCOUNTER — Encounter (HOSPITAL_COMMUNITY): Payer: Self-pay

## 2023-09-17 ENCOUNTER — Ambulatory Visit (HOSPITAL_COMMUNITY)
Admit: 2023-09-17 | Discharge: 2023-09-17 | Disposition: A | Discharge status: Home / Self Care | Source: Ambulatory Visit | Attending: Internal Medicine | Admitting: Internal Medicine

## 2023-09-17 VITALS — BP 130/54 | HR 68 | Ht 69.0 in | Wt 152.8 lb

## 2023-09-17 DIAGNOSIS — I2510 Atherosclerotic heart disease of native coronary artery without angina pectoris: Secondary | ICD-10-CM | POA: Insufficient documentation

## 2023-09-17 DIAGNOSIS — Z7901 Long term (current) use of anticoagulants: Secondary | ICD-10-CM | POA: Insufficient documentation

## 2023-09-17 DIAGNOSIS — I4892 Unspecified atrial flutter: Secondary | ICD-10-CM | POA: Diagnosis not present

## 2023-09-17 DIAGNOSIS — Z951 Presence of aortocoronary bypass graft: Secondary | ICD-10-CM | POA: Insufficient documentation

## 2023-09-17 DIAGNOSIS — J449 Chronic obstructive pulmonary disease, unspecified: Secondary | ICD-10-CM | POA: Insufficient documentation

## 2023-09-17 DIAGNOSIS — I5032 Chronic diastolic (congestive) heart failure: Secondary | ICD-10-CM | POA: Insufficient documentation

## 2023-09-17 DIAGNOSIS — E1122 Type 2 diabetes mellitus with diabetic chronic kidney disease: Secondary | ICD-10-CM | POA: Insufficient documentation

## 2023-09-17 DIAGNOSIS — E875 Hyperkalemia: Secondary | ICD-10-CM | POA: Insufficient documentation

## 2023-09-17 DIAGNOSIS — Z79899 Other long term (current) drug therapy: Secondary | ICD-10-CM | POA: Diagnosis not present

## 2023-09-17 DIAGNOSIS — N184 Chronic kidney disease, stage 4 (severe): Secondary | ICD-10-CM | POA: Insufficient documentation

## 2023-09-17 DIAGNOSIS — I130 Hypertensive heart and chronic kidney disease with heart failure and stage 1 through stage 4 chronic kidney disease, or unspecified chronic kidney disease: Secondary | ICD-10-CM | POA: Diagnosis not present

## 2023-09-17 DIAGNOSIS — Z7902 Long term (current) use of antithrombotics/antiplatelets: Secondary | ICD-10-CM | POA: Insufficient documentation

## 2023-09-17 DIAGNOSIS — Z905 Acquired absence of kidney: Secondary | ICD-10-CM | POA: Diagnosis not present

## 2023-09-17 DIAGNOSIS — Z87891 Personal history of nicotine dependence: Secondary | ICD-10-CM | POA: Insufficient documentation

## 2023-09-17 DIAGNOSIS — Z85528 Personal history of other malignant neoplasm of kidney: Secondary | ICD-10-CM | POA: Insufficient documentation

## 2023-09-17 NOTE — Patient Instructions (Addendum)
 Please hold your Lasix  until next Tuesday (09/21/23). Follow up with general cardiology on 10/19/23. You may call us  at [phone] if any questions for us .

## 2023-09-17 NOTE — Progress Notes (Signed)
 ReDS Vest / Clip - 09/17/23 1054       ReDS Vest / Clip   Station Marker C    Ruler Value 35    ReDS Value Range Low volume    ReDS Actual Value 24

## 2023-09-22 DIAGNOSIS — N184 Chronic kidney disease, stage 4 (severe): Secondary | ICD-10-CM | POA: Diagnosis not present

## 2023-09-22 DIAGNOSIS — E875 Hyperkalemia: Secondary | ICD-10-CM | POA: Diagnosis not present

## 2023-09-22 DIAGNOSIS — I509 Heart failure, unspecified: Secondary | ICD-10-CM | POA: Diagnosis not present

## 2023-09-22 DIAGNOSIS — J449 Chronic obstructive pulmonary disease, unspecified: Secondary | ICD-10-CM | POA: Diagnosis not present

## 2023-09-22 DIAGNOSIS — E1122 Type 2 diabetes mellitus with diabetic chronic kidney disease: Secondary | ICD-10-CM | POA: Diagnosis not present

## 2023-09-22 DIAGNOSIS — I2510 Atherosclerotic heart disease of native coronary artery without angina pectoris: Secondary | ICD-10-CM | POA: Diagnosis not present

## 2023-09-22 DIAGNOSIS — I129 Hypertensive chronic kidney disease with stage 1 through stage 4 chronic kidney disease, or unspecified chronic kidney disease: Secondary | ICD-10-CM | POA: Diagnosis not present

## 2023-09-23 ENCOUNTER — Other Ambulatory Visit: Payer: Self-pay | Admitting: Internal Medicine

## 2023-09-23 ENCOUNTER — Other Ambulatory Visit: Payer: Self-pay

## 2023-09-23 DIAGNOSIS — G894 Chronic pain syndrome: Secondary | ICD-10-CM

## 2023-09-23 NOTE — Patient Instructions (Signed)
 Visit Information  Thank you for taking time to visit with me today. Please don't hesitate to contact me if I can be of assistance to you before our next scheduled telephone appointment.  Our next appointment is by telephone on 09/30/2023 at 0900  Following is a copy of your care plan:   Goals Addressed             This Visit's Progress    [facility] Transitions of Care (TOC) Care Plan       Problems:  Recent Hospitalization for treatment of Sepsis due to community-acquired pneumonia  09/23/2023  Spoke with daughter and patient via phone. Patient is feeling well. No cough. Has follow up planned with pulmonary next week. Denies shortness of breath today.  Knowledge Deficit Related to Sepsis due to community-acquired pneumonia and heart failure  09/23/2023  Weight unchanged per daughters reports.  Not following low salt diet as patient likes to snack.  Nephrology placed on 2 liter fluid restriction, and changed lasix  to as needed.  Denies chest pain or swelling today.    Goal:  Over the next 30 days, the patient will not experience hospital readmission  Interventions:  Transitions of Care: Doctor Visits  - discussed the importance of doctor visits Reviewed Signs and symptoms of infection Reviewed PCP and nephrology visits with patient and daughter. Reviewed changes in medication and medication list updated. . Assessed pain Assessed weight Encouraged patient to continue to use incentive spirometer.  Encouraged daughter to check BP   Heart Failure Interventions: Provided education on low sodium diet- reviewed in detail the importance of low salt. Especially since lasix  is now PRN. Encouraged daughter and patient to read labels and find snack stuff that is lower in salt.  Provided education about placing scale on hard, flat surface Discussed importance of daily weight and advised patient to weigh and record daily Discussed the importance of keeping all appointments with provider Reviewed  difficulty with swallowing and pending stretch of esophagus  Patient Self Care Activities:  Call pharmacy for medication refills 3-7 days in advance of running out of medications Call provider office for new concerns or questions  Notify RN Care Manager of TOC call rescheduling needs Participate in Transition of Care Program/Attend TOC scheduled calls Take medications as prescribed   call office if I gain more than 2 pounds in one day or 5 pounds in one week use salt in moderation- read labels and make better choices for low salt. watch for swelling in feet, ankles and legs every day weigh myself daily Attend pulmonary appointment next week  Plan:  Telephone follow up appointment with care management team member scheduled for:  09/30/2023 at 9am The patient has been provided with contact information for the care management team and has been advised to call with any health related questions or concerns.         Patient verbalizes understanding of instructions and care plan provided today and agrees to view in MyChart. Active MyChart status and patient understanding of how to access instructions and care plan via MyChart confirmed with patient.     Telephone follow up appointment with care management team member scheduled for:  Please call the care guide team at [phone] if you need to cancel or reschedule your appointment.   Please call the Suicide and Crisis Lifeline: 988 call the [place]  National Suicide Prevention Lifeline: 317-837-3446 or TTY: (913)401-3595 TTY 351-139-5591) to talk to a trained counselor call 1-800-273-TALK (toll free, 24 hour hotline) call 911  if you are experiencing a Mental Health or Behavioral Health Crisis or need someone to talk to.  Alan Ee, RN, BSN, CEN [employer]- Transition of Care Team.  [facility] [phone]

## 2023-09-23 NOTE — Addendum Note (Signed)
 Addended by: KATHRYNE MILLMAN B on: 09/23/2023 12:11 PM   Modules accepted: Orders

## 2023-09-23 NOTE — Telephone Encounter (Signed)
 Copied from CRM [id number]. Topic: Clinical - Prescription Issue >> Sep 23, 2023 10:44 AM Larissa RAMAN wrote: Reason for CRM: Patient is requesting to have prescription for oxyCODONE -acetaminophen  (PERCOCET) 10-325 MG, sent to:   [facility] [address], [place] - [address] [place] [address] Phone: [phone] Fax: [phone] Hours: Not open 24 hours  He states [facility] will not ship medication due to the type of medication

## 2023-09-23 NOTE — Transitions of Care (Post Inpatient/ED Visit) (Signed)
 Transition of Care week 2  Visit Note  09/23/2023  Name: [name] MRN: [id number]          DOB: Apr 08, 1940  Situation: Patient enrolled in [facility] 30-day program. Visit completed with patient and daughter by telephone.   Background:   Initial Transition Care Management Follow-up Telephone Call    Past Medical History:  Diagnosis Date   Allergy     Anxiety    B12 deficiency anemia    Blood transfusion without reported diagnosis    CAD (coronary artery disease)    Cancer (HCC)    bladder, Right renal cell also   Chronic kidney disease    Colon polyps    COPD (chronic obstructive pulmonary disease) (HCC)    Depression    Diabetes mellitus (HCC)    Dyspnea    Esophagus, Barrett's    GERD (gastroesophageal reflux disease)    Headache    History of bladder cancer    Bladder cancer 8 times   History of hiatal hernia    Hyperlipidemia    Hypertension    Localized osteoarthrosis, lower leg    Myocardial infarction (HCC) 2021   Pneumonia    Restless leg syndrome    Sleep apnea    does not wear cpap   Stenosis of esophagus     Assessment: Feeling well today. No chest pain, no shortness of breath. No swelling. Weight unchanged.  Patient Reported Symptoms: Cognitive Cognitive Status: Able to follow simple commands, Alert and oriented to person, place, and time, Normal speech and language skills      Neurological Neurological Review of Symptoms: Vision changes (reports blurred vision - does not wear glasses.)    HEENT HEENT Symptoms Reported: Other: HEENT Comment: Reports that he has been having difficulty with swallowing. Reports that he was scheduled for esophageal stretch prior to admission, daughter will reschedule after pulmonary follow up next week.  Encouraged patient to eat soft foods.    Cardiovascular Cardiovascular Symptoms Reported: No symptoms reported Does patient have uncontrolled Hypertension?: No Weight: 149 lb (67.6 kg) Cardiovascular  Self-Management Outcome: 4 (good) Cardiovascular Comment: denies chest pain. denies shortness of breath,  denies irregular heart beat.  Respiratory Respiratory Symptoms Reported: No symptoms reported    Endocrine Endocrine Symptoms Reported: No symptoms reported Is patient diabetic?: No Endocrine Self-Management Outcome: 4 (good)  Gastrointestinal Gastrointestinal Symptoms Reported: No symptoms reported Additional Gastrointestinal Details: LBM daily Gastrointestinal Self-Management Outcome: 4 (good)    Genitourinary Genitourinary Symptoms Reported: No symptoms reported    Integumentary Integumentary Symptoms Reported: Itching Additional Integumentary Details: itchy skin- Skin Comment: lotion  Musculoskeletal Musculoskelatal Symptoms Reviewed: Joint pain Additional Musculoskeletal Details: chronic pain all over        Psychosocial Psychosocial Symptoms Reported: No symptoms reported         Today's Vitals   09/23/23 0927 09/23/23 0928  Weight: 149 lb (67.6 kg)   PainSc:  7       Medications Reviewed Today     Reviewed by Rumalda Alan PENNER, RN (Registered Nurse) on 09/23/23 at [phone]  Med List Status: <None>   Medication Order Taking? Sig Documenting Provider Last Dose Status Informant  acetaminophen  (TYLENOL ) 500 MG tablet [id number] Yes Take 1,000 mg by mouth daily as needed for moderate pain (pain score 4-6) or mild pain (pain score 1-3). [provider]  Active Child, Pharmacy Records  albuterol  (VENTOLIN  HFA) 108 [phone] Base) MCG/ACT inhaler [id number] Yes Inhale 2 puffs into the lungs every 6 (six) hours as  needed for wheezing or shortness of breath. Hunsucker, Donnice SAUNDERS, MD  Active Child, Pharmacy Records  amLODipine  (NORVASC ) 5 MG tablet [id number]  TAKE 1 TABLET BY MOUTH DAILY  Patient not taking: Reported on 09/23/2023   Theophilus Andrews, Tully GRADE, MD  Active Child, Pharmacy Records  apixaban  (ELIQUIS ) 2.5 MG TABS tablet [id number] Yes Take 1 tablet (2.5 mg total) by  mouth 2 (two) times daily. Jonel Lonni SQUIBB, MD  Active   clopidogrel  (PLAVIX ) 75 MG tablet [id number] Yes TAKE 1 TABLET BY MOUTH ONCE  DAILY Burnard Debby LABOR, MD  Active Child, Pharmacy Records           Med Note (CRUTHIS, CHLOE C   Wed Sep 08, 2023  8:11 AM) Has been holding for procedure.   Coenzyme Q10 (CO Q-10) 300 MG CAPS [id number] Yes Take 300 mg by mouth every evening. [provider]  Active Child, Pharmacy Records  cyanocobalamin  (VITAMIN B12) 1000 MCG/ML injection [id number] Yes Inject 1 mL (1,000 mcg total) into the muscle every 30 (thirty) days. Theophilus Andrews, Tully GRADE, MD  Active Child, Pharmacy Records  cyclobenzaprine  (FLEXERIL ) 5 MG tablet [id number] Yes Take 1 tablet (5 mg total) by mouth at bedtime as needed for muscle spasms. Theophilus Andrews, Tully GRADE, MD  Active Child, Pharmacy Records           Med Note (CRUTHIS, SHEFFIELD BROCKS   Wed Sep 08, 2023  7:14 AM)    ezetimibe  (ZETIA ) 10 MG tablet [id number] Yes TAKE 1 TABLET BY MOUTH DAILY Theophilus Andrews, Tully GRADE, MD  Active Child, Pharmacy Records  ferrous sulfate 325 (65 FE) MG EC tablet [id number] Yes Take 325 mg by mouth daily. [provider]  Active Child, Pharmacy Records  fluticasone  (FLONASE ) 50 MCG/ACT nasal spray [id number] Yes Place 1 spray into both nostrils daily. [provider]  Active Child, Pharmacy Records  Fluticasone -Umeclidin-Vilant (TRELEGY ELLIPTA ) 200-62.5-25 MCG/ACT AEPB [id number] Yes Inhale 1 puff into the lungs daily. Hunsucker, Donnice SAUNDERS, MD  Active Child, Pharmacy Records  furosemide  (LASIX ) 20 MG tablet [id number] Yes Take 1 tablet (20 mg total) by mouth every other day.  Patient taking differently: Take 20 mg by mouth as needed.   Jonel Lonni SQUIBB, MD  Active   isosorbide  mononitrate (IMDUR ) 30 MG 24 hr tablet [id number] Yes Take 1 tablet (30 mg total) by mouth daily. Jonel Lonni SQUIBB, MD  Active   Menthol, Topical Analgesic, [facility] EX) [id number] Yes Apply 1  application topically daily as needed (pain). [provider]  Active Child, Pharmacy Records  Multiple Vitamin (MULTIVITAMIN) tablet [id number] Yes Take 1 tablet by mouth daily. [provider]  Active Child, Pharmacy Records  nebivolol  (BYSTOLIC ) 10 MG tablet [id number] Yes Take 0.5 tablets (5 mg total) by mouth daily. Jonel Lonni SQUIBB, MD  Active   nitroGLYCERIN  (NITROSTAT ) 0.4 MG SL tablet [id number]  PLACE 1 TABLET UNDER THE TONGUE EVERY 5 MINUTES AS NEEDED FOR CHEST PAIN  Patient not taking: Reported on 09/23/2023   Theophilus Andrews, Tully GRADE, MD  Active   Nystatin (GERHARDT'S BUTT CREAM) CREA [id number] Yes Apply 1 Application topically daily. Danford, Lonni SQUIBB, MD  Active   oxyCODONE -acetaminophen  (PERCOCET) 10-325 MG tablet [id number]  Take 1 tablet by mouth 2 (two) times daily. Theophilus Andrews, Tully GRADE, MD  Active   oxyCODONE -acetaminophen  (PERCOCET) 10-325 MG tablet [id number]  Take 1 tablet by mouth 2 (two) times daily. Theophilus Andrews, Tully GRADE, MD  Consider Medication Status and Discontinue (Duplicate)   oxyCODONE -acetaminophen  (  PERCOCET) 10-325 MG tablet [id number] Yes Take 1 tablet by mouth 2 (two) times daily. Theophilus Andrews, Tully GRADE, MD  Active   pantoprazole  (PROTONIX ) 40 MG tablet [id number] Yes Take 1 tablet (40 mg total) by mouth daily. Craig Alan SAUNDERS, PA-C  Active Child, Pharmacy Records  prednisoLONE acetate (PRED FORTE) 1 % ophthalmic suspension [id number] Yes Place 1 drop into both eyes 4 (four) times daily. [provider]  Active Child, Pharmacy Records           Med Note (CRUTHIS, CHLOE C   Wed Sep 08, 2023  8:24 AM) Per daughter the pt randomly took this medication out of his medication box and decided to start taking it again on 09/07/23. This prescription is from December of 2024 per daughter. No fill hx found.   rOPINIRole  (REQUIP ) 2 MG tablet [id number] Yes TAKE 1 TABLET BY MOUTH AT  BEDTIME Theophilus Andrews, Tully GRADE, MD  Active  Child, Pharmacy Records  rosuvastatin  (CRESTOR ) 10 MG tablet [id number] Yes TAKE 1 TABLET BY MOUTH DAILY Burnard Debby LABOR, MD  Active Child, Pharmacy Records  saccharomyces boulardii [facility]) 250 MG capsule [id number] Yes Take 250 mg by mouth every evening. [provider]  Active Child, Pharmacy Records  sodium zirconium cyclosilicate  (LOKELMA ) 5 g packet [id number] Yes Take 10 g by mouth daily. [provider]  Active Child, Pharmacy Records  venlafaxine  (EFFEXOR ) 100 MG tablet [id number] Yes Take 1 tablet (100 mg total) by mouth daily. Theophilus Andrews, Tully GRADE, MD  Active Child, Pharmacy Records           Med Note (CRUTHIS, CHLOE C   Wed Sep 08, 2023  7:14 AM)               Goals Addressed             This Visit's Progress    [facility] Transitions of Care (TOC) Care Plan       Problems:  Recent Hospitalization for treatment of Sepsis due to community-acquired pneumonia  09/23/2023  Spoke with daughter and patient via phone. Patient is feeling well. No cough. Has follow up planned with pulmonary next week. Denies shortness of breath today.  Knowledge Deficit Related to Sepsis due to community-acquired pneumonia and heart failure  09/23/2023  Weight unchanged per daughters reports.  Not following low salt diet as patient likes to snack.  Nephrology placed on 2 liter fluid restriction, and changed lasix  to as needed.  Denies chest pain or swelling today.    Goal:  Over the next 30 days, the patient will not experience hospital readmission  Interventions:  Transitions of Care: Doctor Visits  - discussed the importance of doctor visits Reviewed Signs and symptoms of infection Reviewed PCP and nephrology visits with patient and daughter. Reviewed changes in medication and medication list updated. . Assessed pain Assessed weight Encouraged patient to continue to use incentive spirometer.  Encouraged daughter to check BP   Heart Failure Interventions: Provided  education on low sodium diet- reviewed in detail the importance of low salt. Especially since lasix  is now PRN. Encouraged daughter and patient to read labels and find snack stuff that is lower in salt.  Provided education about placing scale on hard, flat surface Discussed importance of daily weight and advised patient to weigh and record daily Discussed the importance of keeping all appointments with provider Reviewed difficulty with swallowing and pending stretch of esophagus  Patient Self Care Activities:  Call pharmacy for medication refills 3-7 days in  advance of running out of medications Call provider office for new concerns or questions  Notify RN Care Manager of TOC call rescheduling needs Participate in Transition of Care Program/Attend TOC scheduled calls Take medications as prescribed   call office if I gain more than 2 pounds in one day or 5 pounds in one week use salt in moderation- read labels and make better choices for low salt. watch for swelling in feet, ankles and legs every day weigh myself daily Attend pulmonary appointment next week  Plan:  Telephone follow up appointment with care management team member scheduled for:  09/30/2023 at 9am The patient has been provided with contact information for the care management team and has been advised to call with any health related questions or concerns.          Recommendation:   Continue Current Plan of Care  Follow Up Plan:   Telephone follow up appointment date/time:  09/30/2023 0900  Alan Ee, RN, BSN, CEN [employer] [facility]- Transition of Care Team.  [facility] [phone]

## 2023-09-27 MED ORDER — OXYCODONE-ACETAMINOPHEN 10-325 MG PO TABS: 1.0000 | ORAL_TABLET | Freq: Two times a day (BID) | ORAL | 0 refills | Status: DC

## 2023-09-27 MED ORDER — OXYCODONE-ACETAMINOPHEN 10-325 MG PO TABS
1.0000 | ORAL_TABLET | Freq: Two times a day (BID) | ORAL | 0 refills | Status: DC
Start: 2023-09-27 — End: 2023-10-19

## 2023-09-28 ENCOUNTER — Ambulatory Visit (INDEPENDENT_AMBULATORY_CARE_PROVIDER_SITE_OTHER): Admitting: Adult Health

## 2023-09-28 ENCOUNTER — Ambulatory Visit: Payer: Self-pay | Admitting: Internal Medicine

## 2023-09-28 ENCOUNTER — Ambulatory Visit (INDEPENDENT_AMBULATORY_CARE_PROVIDER_SITE_OTHER)

## 2023-09-28 ENCOUNTER — Encounter: Payer: Self-pay | Admitting: Internal Medicine

## 2023-09-28 ENCOUNTER — Encounter: Payer: Self-pay | Admitting: Adult Health

## 2023-09-28 VITALS — BP 118/60 | HR 68 | Temp 97.7°F | Ht 68.0 in | Wt 155.6 lb

## 2023-09-28 DIAGNOSIS — J189 Pneumonia, unspecified organism: Secondary | ICD-10-CM

## 2023-09-28 DIAGNOSIS — I483 Typical atrial flutter: Secondary | ICD-10-CM

## 2023-09-28 DIAGNOSIS — I5033 Acute on chronic diastolic (congestive) heart failure: Secondary | ICD-10-CM | POA: Diagnosis not present

## 2023-09-28 DIAGNOSIS — A419 Sepsis, unspecified organism: Secondary | ICD-10-CM

## 2023-09-28 DIAGNOSIS — N184 Chronic kidney disease, stage 4 (severe): Secondary | ICD-10-CM

## 2023-09-28 DIAGNOSIS — K219 Gastro-esophageal reflux disease without esophagitis: Secondary | ICD-10-CM

## 2023-09-28 DIAGNOSIS — R918 Other nonspecific abnormal finding of lung field: Secondary | ICD-10-CM | POA: Diagnosis not present

## 2023-09-28 DIAGNOSIS — G894 Chronic pain syndrome: Secondary | ICD-10-CM | POA: Diagnosis not present

## 2023-09-28 DIAGNOSIS — Z23 Encounter for immunization: Secondary | ICD-10-CM

## 2023-09-28 NOTE — Patient Instructions (Addendum)
 Continue on Trelegy 1 puff daily, rinse after use.  Albuterol  inhaler As needed   Activity as tolerated.   Chest xray today.  Flu shot today.  Aspiration precautions as discussed   Keep follow up with Cardiology and Nephrology.   Follow up up with Dr. Annella in 2-3 months and As needed

## 2023-09-28 NOTE — Progress Notes (Signed)
 @Patient  ID: [name], male    DOB: 07-Jun-1940, 83 y.o.   MRN: [id number]  Chief Complaint  Patient presents with   Hospitalization Follow-up   Shortness of Breath    Referring provider: Theophilus Andrews, Jonna*  HPI: 83 year old male with minimum smoking history followed for asthma and chronic dyspnea Medical history significant for coronary disease status post CABG, renal cell carcinoma s/p nephrectomy, Stage IV Chronic kidney disease.   TEST/EVENTS :   09/28/2023 Follow up ; posthospital follow-up for pneumonia Discussed the use of AI scribe software for clinical note transcription with the patient, who gave verbal consent to proceed.  History of Present Illness [name] is an 83 year old male with Asthma who presents for follow-up after hospitalization for Sepsis due to Community acquired pneumonia. Complicated by acute hypoxic respiratory failure and decompensated CHF. New onset Atrial Flutter.  He was recently hospitalized with fever, severe dyspnea, hypoxia found to pneumonia and decompensated CHF and new onset Atrial Flutter.  CT chest showed multifocal ground glass opacities bilaterally with septal wall thickening , consolidative opacities R>L Moderate Right pleural effusion, Treated with aggressive antibiotics and diuresis Started on Eliquis . .. He feels much better since discharge and has been more active, engaging in activities outside and considering fishing. He monitors his weight daily and is under the care of a heart failure clinic. Leg swelling is improved.   For Asthma , he uses a Trelegy inhaler once daily. He also has an albuterol  inhaler but uses it infrequently. He reports improved breathing and no current use of oxygen.  He has a history of chronic pain due chronic joint pain. He manages his pain with Percocet,.   He has a history of esophageal stenosis. Recent dilation, which was postponed due to his recent hospitalization. Symptoms are  currently improved .   He has experienced recurrent pneumonia in the past.   His social history includes a long career as a Scientist, product/process development, with potential exposure to dust and asbestos. He smoked for about five to six years, approximately a pack a day, but quit many years ago.    No Known Allergies  Immunization History  Administered Date(s) Administered   Fluad Quad(high Dose 65+) 11/02/2018, 11/09/2019, 10/16/2021   INFLUENZA, HIGH DOSE SEASONAL PF 10/19/2013, 10/22/2014, 09/30/2015, 10/26/2016, 11/19/2017, 11/01/2020, 11/04/2022, 09/28/2023   Influenza Split 10/01/2010, 10/16/2011   Influenza Whole 01/20/2004, 11/24/2006, 10/19/2008, 10/22/2009   Influenza,inj,Quad PF,6+ Mos 10/18/2012   Influenza,inj,quad, With Preservative 10/19/2016   Moderna Sars-Covid-2 Vaccination 03/12/2019, 04/03/2019, 12/29/2019, 10/22/2020   Pfizer Covid-19 Vaccine Bivalent Booster 77yrs & up 10/22/2020   Pfizer(Comirnaty)Fall Seasonal Vaccine 12 years and older 11/19/2021   Pneumococcal Conjugate-13 05/29/2013   Pneumococcal Polysaccharide-23 10/22/2009   Tdap 11/24/2016   Tetanus 05/29/2013    Past Medical History:  Diagnosis Date   Allergy     Anxiety    B12 deficiency anemia    Blood transfusion without reported diagnosis    CAD (coronary artery disease)    Cancer (HCC)    bladder, Right renal cell also   Chronic kidney disease    Colon polyps    COPD (chronic obstructive pulmonary disease) (HCC)    Depression    Diabetes mellitus (HCC)    Dyspnea    Esophagus, Barrett's    GERD (gastroesophageal reflux disease)    Headache    History of bladder cancer    Bladder cancer 8 times   History of hiatal hernia    Hyperlipidemia  Hypertension    Localized osteoarthrosis, lower leg    Myocardial infarction (HCC) 2021   Pneumonia    Restless leg syndrome    Sleep apnea    does not wear cpap   Stenosis of esophagus     Tobacco History: Social History   Tobacco  Use  Smoking Status Former   Current packs/day: 0.00   Average packs/day: 0.5 packs/day for 5.0 years (2.5 ttl pk-yrs)   Types: Cigarettes   Start date: 03/31/1971   Quit date: 03/30/1976   Years since quitting: 47.5  Smokeless Tobacco Never   Counseling given: Not Answered   Outpatient Medications Prior to Visit  Medication Sig Dispense Refill   acetaminophen  (TYLENOL ) 500 MG tablet Take 1,000 mg by mouth daily as needed for moderate pain (pain score 4-6) or mild pain (pain score 1-3).     albuterol  (VENTOLIN  HFA) 108 (90 Base) MCG/ACT inhaler Inhale 2 puffs into the lungs every 6 (six) hours as needed for wheezing or shortness of breath. 1 each 6   apixaban  (ELIQUIS ) 2.5 MG TABS tablet Take 1 tablet (2.5 mg total) by mouth 2 (two) times daily. 180 tablet 0   clopidogrel  (PLAVIX ) 75 MG tablet TAKE 1 TABLET BY MOUTH ONCE  DAILY 100 tablet 2   Coenzyme Q10 (CO Q-10) 300 MG CAPS Take 300 mg by mouth every evening.     cyanocobalamin  (VITAMIN B12) 1000 MCG/ML injection Inject 1 mL (1,000 mcg total) into the muscle every 30 (thirty) days. 6 mL 11   cyclobenzaprine  (FLEXERIL ) 5 MG tablet Take 1 tablet (5 mg total) by mouth at bedtime as needed for muscle spasms. 30 tablet 0   ezetimibe  (ZETIA ) 10 MG tablet TAKE 1 TABLET BY MOUTH DAILY 100 tablet 1   ferrous sulfate 325 (65 FE) MG EC tablet Take 325 mg by mouth daily.     fluticasone  (FLONASE ) 50 MCG/ACT nasal spray Place 1 spray into both nostrils daily.     Fluticasone -Umeclidin-Vilant (TRELEGY ELLIPTA ) 200-62.5-25 MCG/ACT AEPB Inhale 1 puff into the lungs daily. 60 each 11   furosemide  (LASIX ) 20 MG tablet Take 1 tablet (20 mg total) by mouth every other day. 15 tablet 0   isosorbide  mononitrate (IMDUR ) 30 MG 24 hr tablet Take 1 tablet (30 mg total) by mouth daily. 90 tablet 0   Menthol, Topical Analgesic, (BENGAY EX) Apply 1 application topically daily as needed (pain).     nebivolol  (BYSTOLIC ) 10 MG tablet Take 0.5 tablets (5 mg total) by  mouth daily. 45 tablet 0   nitroGLYCERIN  (NITROSTAT ) 0.4 MG SL tablet PLACE 1 TABLET UNDER THE TONGUE EVERY 5 MINUTES AS NEEDED FOR CHEST PAIN 25 tablet 0   Nystatin (GERHARDT'S BUTT CREAM) CREA Apply 1 Application topically daily. 60 each 0   oxyCODONE -acetaminophen  (PERCOCET) 10-325 MG tablet Take 1 tablet by mouth 2 (two) times daily. 60 tablet 0   pantoprazole  (PROTONIX ) 40 MG tablet Take 1 tablet (40 mg total) by mouth daily. 100 tablet 3   rOPINIRole  (REQUIP ) 2 MG tablet TAKE 1 TABLET BY MOUTH AT  BEDTIME 100 tablet 1   rosuvastatin  (CRESTOR ) 10 MG tablet TAKE 1 TABLET BY MOUTH DAILY 90 tablet 2   saccharomyces boulardii (FLORASTOR) 250 MG capsule Take 250 mg by mouth every evening.     sodium zirconium cyclosilicate  (LOKELMA ) 5 g packet Take 10 g by mouth daily.     venlafaxine  (EFFEXOR ) 100 MG tablet Take 1 tablet (100 mg total) by mouth daily. 90 tablet 1  amLODipine  (NORVASC ) 5 MG tablet TAKE 1 TABLET BY MOUTH DAILY (Patient not taking: Reported on 09/28/2023) 100 tablet 1   Multiple Vitamin (MULTIVITAMIN) tablet Take 1 tablet by mouth daily. (Patient not taking: Reported on 09/28/2023)     oxyCODONE -acetaminophen  (PERCOCET) 10-325 MG tablet Take 1 tablet by mouth 2 (two) times daily. (Patient not taking: Reported on 09/28/2023) 60 tablet 0   oxyCODONE -acetaminophen  (PERCOCET) 10-325 MG tablet Take 1 tablet by mouth 2 (two) times daily. (Patient not taking: Reported on 09/28/2023) 60 tablet 0   prednisoLONE acetate (PRED FORTE) 1 % ophthalmic suspension Place 1 drop into both eyes 4 (four) times daily. (Patient not taking: Reported on 09/28/2023)     No facility-administered medications prior to visit.     Review of Systems:   Constitutional:   No  weight loss, night sweats,  Fevers, chills, fatigue, or  lassitude.  HEENT:   No headaches,  Difficulty swallowing,  Tooth/dental problems, or  Sore throat,                No sneezing, itching, ear ache, nasal congestion, post nasal drip,   CV:   No chest pain,  Orthopnea, PND, swelling in lower extremities, anasarca, dizziness, palpitations, syncope.   GI  No heartburn, indigestion, abdominal pain, nausea, vomiting, diarrhea, change in bowel habits, loss of appetite, bloody stools.   Resp:   No chest wall deformity  Skin: no rash or lesions.  GU: no dysuria, change in color of urine, no urgency or frequency.  No flank pain, no hematuria   MS:  +joint pain    Physical Exam  BP 118/60 (BP Location: Left Arm, Patient Position: Sitting)   Pulse 68   Temp 97.7 F (36.5 C) (Oral)   Ht 5' 8 (1.727 m)   Wt 155 lb 9.6 oz (70.6 kg)   SpO2 94% Comment: RA  BMI 23.66 kg/m   GEN: A/Ox3; pleasant , NAD, elderly    HEENT:  Julian/AT,  EACs-clear, TMs-wnl, NOSE-clear, THROAT-clear, no lesions, no postnasal drip or exudate noted.   NECK:  Supple w/ fair ROM; no JVD; normal carotid impulses w/o bruits; no thyromegaly or nodules palpated; no lymphadenopathy.    RESP  Clear  P & A; w/o, wheezes/ rales/ or rhonchi. no accessory muscle use, no dullness to percussion  CARD:  RRR, no m/r/g, no peripheral edema, pulses intact, no cyanosis or clubbing.  GI:   Soft & nt; nml bowel sounds; no organomegaly or masses detected.   Musco: Warm bil, no deformities or joint swelling noted.   Neuro: alert, no focal deficits noted.    Skin: Warm, no lesions or rashes    Lab Results:    BMET      Imaging: DG CHEST PORT 1 VIEW Result Date: 09/12/2023 CLINICAL DATA:  Follow-up exam.  Pulmonary edema. EXAM: PORTABLE CHEST 1 VIEW COMPARISON:  09/10/2023. FINDINGS: The heart size and mediastinal contours are stable. There is atherosclerotic calcification of the aorta. There is improved aeration of the lungs bilaterally with mild residual airspace disease at the left lung base. There is a small left pleural effusion. No pneumothorax is seen. Sternotomy wires and cervical spinal fusion hardware are noted. No acute osseous abnormality. IMPRESSION:  Improved aeration of the lungs bilaterally. Small residual left pleural effusion with atelectasis, edema, or infiltrate at the left lung base. Electronically Signed   By: Leita Birmingham M.D.   On: 09/12/2023 18:32   DG Chest 2 View Result Date: 09/10/2023 CLINICAL DATA:  799191 Hypoxia [id number] EXAM: CHEST - 2 VIEW COMPARISON:  09/07/2023. FINDINGS: Stable cardiomediastinal contours. Prior median sternotomy and CABG. Low lung volumes. Similar small bilateral pleural effusions. Similar bilateral interstitial prominence, suggestive of pulmonary edema, with patchy bilateral airspace opacities, more pronounced on the right. Visualized osseous structures are unchanged. IMPRESSION: 1. Small bilateral pleural effusions, not significantly changed. 2. Similar bilateral interstitial prominence, suggestive of pulmonary edema, with patchy bilateral airspace opacities, more pronounced on the right. Electronically Signed   By: Harrietta Sherry M.D.   On: 09/10/2023 18:05   ECHOCARDIOGRAM COMPLETE Result Date: 09/08/2023    ECHOCARDIOGRAM REPORT   Patient Name:   LEYTON MAGOON Date of Exam: 09/08/2023 Medical Rec #:  [id number]         Height:       69.0 in Accession #:    [id number]        Weight:       160.3 lb Date of Birth:  [birth year]         BSA:          1.880 m Patient Age:    83 years          BP:           141/63 mmHg Patient Gender: M                 HR:           100 bpm. Exam Location:  Inpatient Procedure: 2D Echo, Cardiac Doppler and Color Doppler (Both Spectral and Color            Flow Doppler were utilized during procedure). Indications:    I50.40* Unspecified combined systolic (congestive) and diastolic                 (congestive) heart failure  History:        Patient has prior history of Echocardiogram examinations, most                 recent 05/12/2022. CAD and Previous Myocardial Infarction, Prior                 CABG, Arrythmias:Atrial Fibrillation, Signs/Symptoms:Shortness                 of Breath  and Dyspnea; Risk Factors:Dyslipidemia, Diabetes and                 Hypertension. Cancer.  Sonographer:    Ellouise Mose RDCS Referring Phys: [id number] [facility]  Sonographer Comments: Technically difficult study due to poor echo windows. Patient in high fowler's position. Patient in AFIB and destatted to 72 during exam. IMPRESSIONS  1. Left ventricular ejection fraction, by estimation, is 50 to 55%. The left ventricle has low normal function. The left ventricle has no regional wall motion abnormalities. Left ventricular diastolic parameters are indeterminate.  2. Right ventricular systolic function is normal. The right ventricular size is normal. Tricuspid regurgitation signal is inadequate for assessing PA pressure.  3. Left atrial size was moderately dilated.  4. The mitral valve is normal in structure. Trivial mitral valve regurgitation. No evidence of mitral stenosis.  5. The aortic valve is tricuspid. There is mild calcification of the aortic valve. Aortic valve regurgitation is not visualized. Aortic valve sclerosis/calcification is present, without any evidence of aortic stenosis.  6. The inferior vena cava is normal in size with greater than 50% respiratory variability, suggesting right atrial pressure of 3 mmHg. FINDINGS  Left Ventricle: Left ventricular ejection fraction,  by estimation, is 50 to 55%. The left ventricle has low normal function. The left ventricle has no regional wall motion abnormalities. The left ventricular internal cavity size was normal in size. There is no left ventricular hypertrophy. Left ventricular diastolic parameters are indeterminate. Right Ventricle: The right ventricular size is normal. No increase in right ventricular wall thickness. Right ventricular systolic function is normal. Tricuspid regurgitation signal is inadequate for assessing PA pressure. Left Atrium: Left atrial size was moderately dilated. Right Atrium: Right atrial size was normal in size. Pericardium: There  is no evidence of pericardial effusion. Mitral Valve: The mitral valve is normal in structure. Mild mitral annular calcification. Trivial mitral valve regurgitation. No evidence of mitral valve stenosis. Tricuspid Valve: The tricuspid valve is normal in structure. Tricuspid valve regurgitation is trivial. No evidence of tricuspid stenosis. Aortic Valve: The aortic valve is tricuspid. There is mild calcification of the aortic valve. Aortic valve regurgitation is not visualized. Aortic valve sclerosis/calcification is present, without any evidence of aortic stenosis. Pulmonic Valve: The pulmonic valve was normal in structure. Pulmonic valve regurgitation is trivial. No evidence of pulmonic stenosis. Aorta: The aortic root is normal in size and structure. Venous: The inferior vena cava is normal in size with greater than 50% respiratory variability, suggesting right atrial pressure of 3 mmHg. IAS/Shunts: No atrial level shunt detected by color flow Doppler.  LEFT VENTRICLE PLAX 2D LVIDd:         5.02 cm LVIDs:         3.65 cm LV PW:         1.13 cm LV IVS:        1.09 cm LVOT diam:     2.38 cm LV SV:         77 LV SV Index:   41 LVOT Area:     4.45 cm  LV Volumes (MOD) LV vol d, MOD A2C: 67.4 ml LV vol d, MOD A4C: 95.3 ml LV vol s, MOD A2C: 36.9 ml LV vol s, MOD A4C: 40.7 ml LV SV MOD A2C:     30.5 ml LV SV MOD A4C:     95.3 ml LV SV MOD BP:      42.5 ml RIGHT VENTRICLE            IVC RV S prime:     9.90 cm/s  IVC diam: 1.37 cm TAPSE (M-mode): 1.2 cm LEFT ATRIUM             Index        RIGHT ATRIUM           Index LA diam:        4.99 cm 2.65 cm/m   RA Area:     12.80 cm LA Vol (A2C):   28.4 ml 15.10 ml/m  RA Volume:   32.30 ml  17.18 ml/m LA Vol (A4C):   45.8 ml 24.36 ml/m LA Biplane Vol: 37.5 ml 19.94 ml/m  AORTIC VALVE LVOT Vmax:   104.00 cm/s LVOT Vmean:  67.600 cm/s LVOT VTI:    0.173 m  AORTA Ao Root diam: 3.42 cm MITRAL VALVE MV Area (PHT): 3.99 cm     SHUNTS MV Decel Time: 190 msec     Systemic VTI:   0.17 m MV E velocity: 164.00 cm/s  Systemic Diam: 2.38 cm MV A velocity: 110.00 cm/s MV E/A ratio:  1.49 Toribio Fuel MD Electronically signed by Toribio Fuel MD Signature Date/Time: 09/08/2023/3:25:14 PM    Final  CT Chest Wo Contrast Result Date: 09/07/2023 CLINICAL DATA:  Pneumonia, complications suspected, same date chest radiograph done EXAM: CT CHEST WITHOUT CONTRAST TECHNIQUE: Multidetector CT imaging of the chest was performed following the standard protocol without IV contrast. RADIATION DOSE REDUCTION: This exam was performed according to the departmental dose-optimization program which includes automated exposure control, adjustment of the mA and/or kV according to patient size and/or use of iterative reconstruction technique. COMPARISON:  Same day chest radiograph FINDINGS: Cardiovascular: Sequelae of prior CABG. Normal caliber thoracic aorta. No pericardial effusion. Mediastinum/Nodes: Prominent mediastinal lymphadenopathy throughout, with the right paratracheal lymph node measuring up to 1.3 cm in short axis. Lungs/Pleura: Multifocal ground-glass opacities bilaterally with some septal wall thickening suggestive of pulmonary edema. There are also multifocal consolidative opacities, right greater than left. Moderate right pleural effusion. Trace left pleural effusion with loculated fluid in the left major fissure. Upper Abdomen: Pneumobilia. Status post cholecystectomy. Status post right nephrectomy. Musculoskeletal: Partially imaged ACDF hardware. Status post sternotomy. No acute findings. IMPRESSION: 1. Findings suggestive of pulmonary edema with moderate right pleural effusion and loculated fluid in the left major fissure. There are also multifocal consolidative opacities in the bilateral lungs, right greater than left, which could be seen with superimposed infection in the appropriate clinical setting. 2.  Mediastinal lymphadenopathy, likely reactive. Aortic Atherosclerosis (ICD10-I70.0).  Electronically Signed   By: Michaeline Blanch M.D.   On: 09/07/2023 22:03   DG Chest Port 1 View Result Date: 09/07/2023 CLINICAL DATA:  10026. Shortness of breath worsening over the past 2 days. EXAM: PORTABLE CHEST 1 VIEW COMPARISON:  Portable chest 07/07/2023. FINDINGS: 8:26 p.m. there is chronic pleural-parenchymal disease in the left lower lung field. Small pleural effusions are beginning to form. The heart is slightly enlarged with CABG changes again noted and increased central vascular prominence. There is mild generalized interstitial consolidation consistent with interstitial edema. Findings consistent with CHF or fluid overload. There is patchy increased opacity in the left lower lung field which could be atelectasis, pneumonia or alveolar edema. No other focal infiltrate is seen. The mediastinum is normally outlined. Aortic atherosclerosis. Multilevel lower cervical fusion plating is again shown. Degenerative change thoracic spine. IMPRESSION: 1. Findings consistent with mild CHF or fluid overload. 2. Patchy increased opacity in the left lower lung field which could be atelectasis, pneumonia or alveolar edema. 3. Chronic pleural-parenchymal disease in the left lower lung field. 4. Aortic atherosclerosis. Electronically Signed   By: Francis Quam M.D.   On: 09/07/2023 20:38    Administration History     None          Latest Ref Rng & Units 08/11/2023    8:52 AM  PFT Results  FVC-Pre L 2.03   FVC-Predicted Pre % 88   FVC-Post L 2.18   FVC-Predicted Post % 95   Pre FEV1/FVC % % 86   Post FEV1/FCV % % 84   FEV1-Pre L 1.74   FEV1-Predicted Pre % 113   FEV1-Post L 1.84   DLCO uncorrected ml/min/mmHg 13.95   DLCO UNC% % 83   DLCO corrected ml/min/mmHg 15.43   DLCO COR %Predicted % 91   DLVA Predicted % 133   TLC L 4.47   TLC % Predicted % 92   RV % Predicted % 125     No results found for: NITRICOXIDE      Assessment & Plan:   No problem-specific Assessment & Plan notes  found for this encounter.  Assessment and Plan Assessment & Plan Pneumonia-CAP with  Sepsis , recent hospitalization and follow-up   He was recently hospitalized for pneumonia with Sepsis  and has shown significant improvement post-discharge. Potential aspiration may contribute to recurrent pneumonia. Order a chest x-ray to assess resolution. Consider a CT scan of the lungs once he is better to evaluate scarring and establish a baseline lung condition.Aspiration precautions discussed.   Decompensated Congestive heart failure with pulmonary edema complicated by chronic kidney disease  -improved with diuresis during hospital stay.  Continue monitoring daily weights and follow up with cardiology and nephrology.  New onset atrial Flutter, now on anticoagulation   Continue follow up with Cards. Monitor closely as on both Eliquis  and Plavix ,  Asthma-continue on Trelegy . SABRA Continue the Trelegy inhaler once daily, ensure mouth rinsing after use, and monitor asthma symptoms and activity levels.   Gastroesophageal reflux disease with esophageal stricture and chronic swallow issues   Chronic GERD with esophageal stricture and swallow issues is present, with potential aspiration contributing to recurrent pneumonia. Advise on dietary habits to minimize aspiration risk, such as not eating close to bedtime and staying upright after meals. Follow up with GI to discuss rescheduling GI dilation.   Chronic pain syndrome   Chronic pain is managed with Percocet due to multiple surgeries. Continue the current Percocet regimen with caution.   Plan  Patient Instructions  Continue on Trelegy 1 puff daily, rinse after use.  Albuterol  inhaler As needed   Activity as tolerated.   Chest xray today.  Flu shot today.  Aspiration precautions as discussed   Keep follow up with Cardiology and Nephrology.   Follow up up with Dr. Annella in 2-3 months and As needed           I spent   42 minutes dedicated  to the care of this patient on the date of this encounter to include pre-visit review of records, face-to-face time with the patient discussing conditions above, post visit ordering of testing, clinical documentation with the electronic health record, making appropriate referrals as documented, and communicating necessary findings to members of the patients care team.   Madelin Stank, NP 09/28/2023

## 2023-09-30 ENCOUNTER — Other Ambulatory Visit: Payer: Self-pay

## 2023-09-30 NOTE — Transitions of Care (Post Inpatient/ED Visit) (Signed)
 Transition of Care week 3  Visit Note  09/30/2023  Name: [name] MRN: [id number]          DOB: Oct 07, 1940  Situation: Patient enrolled in [facility] 30-day program. Visit completed with patient and daughter by telephone.   Background:   Initial Transition Care Management Follow-up Telephone Call    Past Medical History:  Diagnosis Date   Allergy     Anxiety    B12 deficiency anemia    Blood transfusion without reported diagnosis    CAD (coronary artery disease)    Cancer (HCC)    bladder, Right renal cell also   Chronic kidney disease    Colon polyps    COPD (chronic obstructive pulmonary disease) (HCC)    Depression    Diabetes mellitus (HCC)    Dyspnea    Esophagus, Barrett's    GERD (gastroesophageal reflux disease)    Headache    History of bladder cancer    Bladder cancer 8 times   History of hiatal hernia    Hyperlipidemia    Hypertension    Localized osteoarthrosis, lower leg    Myocardial infarction (HCC) 2021   Pneumonia    Restless leg syndrome    Sleep apnea    does not wear cpap   Stenosis of esophagus    Vitals:   09/30/23 1123  BP: (!) 140/70    Assessment: Patient reports that he is feeling better.  No new concerns today.  Patient Reported Symptoms: Cognitive Cognitive Status: Able to follow simple commands, Alert and oriented to person, place, and time, Difficulties with attention and concentration      Neurological Neurological Review of Symptoms: Vision changes Neurological Management Strategies: Medication therapy Neurological Comment: blurred vision ongoing since being in the hospital for 4 months.  saw eye specialist  HEENT HEENT Symptoms Reported: Other: HEENT Comment: patient reports that he is swallowing better.    Cardiovascular Cardiovascular Symptoms Reported: Other: Other Cardiovascular Symptoms: denies any cardiac symptoms. Does patient have uncontrolled Hypertension?: No Cardiovascular Management Strategies: Medication  therapy Weight: 151 lb (68.5 kg) Cardiovascular Self-Management Outcome: 4 (good)  Respiratory Respiratory Symptoms Reported: Shortness of breath Other Respiratory Symptoms: reports that he thought he was having an asthma attack yesterday. used an albuterol  inhalers and then felt better Respiratory Self-Management Outcome: 4 (good)  Endocrine Endocrine Symptoms Reported: No symptoms reported Is patient diabetic?: No Endocrine Self-Management Outcome: 4 (good)  Gastrointestinal Gastrointestinal Symptoms Reported: Other Other Gastrointestinal Symptoms: denies NVD. Last Bm last night      Genitourinary Genitourinary Symptoms Reported: No symptoms reported    Integumentary Integumentary Symptoms Reported: Other Other Integumentary Symptoms: not itching as much    Musculoskeletal Musculoskelatal Symptoms Reviewed: Joint pain Additional Musculoskeletal Details: chronic pain is unchanged        Psychosocial Psychosocial Symptoms Reported: No symptoms reported         Vitals:   09/30/23 1123  BP: (!) 140/70    Medications Reviewed Today     Reviewed by Rumalda Alan PENNER, RN (Registered Nurse) on 09/30/23 at 1115  Med List Status: <None>   Medication Order Taking? Sig Documenting Provider Last Dose Status Informant  acetaminophen  (TYLENOL ) 500 MG tablet [id number]  Take 1,000 mg by mouth daily as needed for moderate pain (pain score 4-6) or mild pain (pain score 1-3). [provider]  Active Child, Pharmacy Records  albuterol  (VENTOLIN  HFA) [phone] Base) MCG/ACT inhaler [id number]  Inhale 2 puffs into the lungs every 6 (six) hours as  needed for wheezing or shortness of breath. Hunsucker, Donnice SAUNDERS, MD  Active Child, Pharmacy Records  amLODipine  (NORVASC ) 5 MG tablet [id number]  TAKE 1 TABLET BY MOUTH DAILY  Patient not taking: Reported on 09/28/2023   Theophilus Andrews, Tully GRADE, MD  Active Child, Pharmacy Records  apixaban  (ELIQUIS ) 2.5 MG TABS tablet [id number]  Take 1 tablet (2.5  mg total) by mouth 2 (two) times daily. Jonel Lonni SQUIBB, MD  Active   clopidogrel  (PLAVIX ) 75 MG tablet [id number]  TAKE 1 TABLET BY MOUTH ONCE  DAILY Burnard Debby LABOR, MD  Active Child, Pharmacy Records           Med Note (CRUTHIS, CHLOE C   Wed Sep 08, 2023  8:11 AM) Has been holding for procedure.   Coenzyme Q10 (CO Q-10) 300 MG CAPS [id number]  Take 300 mg by mouth every evening. [provider]  Active Child, Pharmacy Records  cyanocobalamin  (VITAMIN B12) 1000 MCG/ML injection [id number]  Inject 1 mL (1,000 mcg total) into the muscle every 30 (thirty) days. Theophilus Andrews, Tully GRADE, MD  Active Child, Pharmacy Records  cyclobenzaprine  (FLEXERIL ) 5 MG tablet [id number]  Take 1 tablet (5 mg total) by mouth at bedtime as needed for muscle spasms. Theophilus Andrews, Tully GRADE, MD  Active Child, Pharmacy Records           Med Note (CRUTHIS, SHEFFIELD BROCKS   Wed Sep 08, 2023  7:14 AM)    ezetimibe  (ZETIA ) 10 MG tablet [id number]  TAKE 1 TABLET BY MOUTH DAILY Theophilus Andrews, Tully GRADE, MD  Active Child, Pharmacy Records  ferrous sulfate 325 (65 FE) MG EC tablet [id number]  Take 325 mg by mouth daily. [provider]  Active Child, Pharmacy Records  fluticasone  (FLONASE ) 50 MCG/ACT nasal spray [id number]  Place 1 spray into both nostrils daily. [provider]  Active Child, Pharmacy Records  Fluticasone -Umeclidin-Vilant (TRELEGY ELLIPTA ) 200-62.5-25 MCG/ACT AEPB [id number]  Inhale 1 puff into the lungs daily. Hunsucker, Donnice SAUNDERS, MD  Active Child, Pharmacy Records  furosemide  (LASIX ) 20 MG tablet [id number]  Take 1 tablet (20 mg total) by mouth every other day. Jonel Lonni SQUIBB, MD  Active   isosorbide  mononitrate (IMDUR ) 30 MG 24 hr tablet [id number]  Take 1 tablet (30 mg total) by mouth daily. Jonel Lonni SQUIBB, MD  Active   Menthol, Topical Analgesic, [facility] EX) [id number]  Apply 1 application topically daily as needed (pain). [provider]  Active Child,  Pharmacy Records  Multiple Vitamin (MULTIVITAMIN) tablet [id number]  Take 1 tablet by mouth daily.  Patient not taking: Reported on 09/28/2023   [provider]  Active Child, Pharmacy Records  nebivolol  (BYSTOLIC ) 10 MG tablet [id number]  Take 0.5 tablets (5 mg total) by mouth daily. Jonel Lonni SQUIBB, MD  Active   nitroGLYCERIN  (NITROSTAT ) 0.4 MG SL tablet [id number]  PLACE 1 TABLET UNDER THE TONGUE EVERY 5 MINUTES AS NEEDED FOR CHEST PAIN Theophilus Andrews, Tully GRADE, MD  Active   Nystatin (GERHARDT'S BUTT CREAM) CREA [id number]  Apply 1 Application topically daily. Danford, Lonni SQUIBB, MD  Active   oxyCODONE -acetaminophen  (PERCOCET) 10-325 MG tablet [id number]  Take 1 tablet by mouth 2 (two) times daily. Theophilus Andrews, Tully GRADE, MD  Active   oxyCODONE -acetaminophen  (PERCOCET) 10-325 MG tablet [id number]  Take 1 tablet by mouth 2 (two) times daily.  Patient not taking: Reported on 09/28/2023   Theophilus Andrews, Tully GRADE, MD  Active   oxyCODONE -acetaminophen  (PERCOCET) 10-325 MG tablet [id number]  Take 1 tablet  by mouth 2 (two) times daily.  Patient not taking: Reported on 09/28/2023   Theophilus Andrews, Tully GRADE, MD  Active   pantoprazole  (PROTONIX ) 40 MG tablet [id number]  Take 1 tablet (40 mg total) by mouth daily. Craig Alan SAUNDERS, PA-C  Active Child, Pharmacy Records  prednisoLONE acetate (PRED FORTE) 1 % ophthalmic suspension [id number]  Place 1 drop into both eyes 4 (four) times daily.  Patient not taking: Reported on 09/28/2023   [provider]  Active Child, Pharmacy Records           Med Note (CRUTHIS, CHLOE C   Wed Sep 08, 2023  8:24 AM) Per daughter the pt randomly took this medication out of his medication box and decided to start taking it again on 09/07/23. This prescription is from December of 2024 per daughter. No fill hx found.   rOPINIRole  (REQUIP ) 2 MG tablet [id number]  TAKE 1 TABLET BY MOUTH AT  BEDTIME Theophilus Andrews, Tully GRADE, MD  Active Child, Pharmacy  Records  rosuvastatin  (CRESTOR ) 10 MG tablet [id number]  TAKE 1 TABLET BY MOUTH DAILY Burnard Debby LABOR, MD  Active Child, Pharmacy Records  saccharomyces boulardii (FLORASTOR) 250 MG capsule [id number]  Take 250 mg by mouth every evening. [provider]  Active Child, Pharmacy Records  sodium zirconium cyclosilicate  (LOKELMA ) 5 g packet [id number]  Take 10 g by mouth daily. [provider]  Active Child, Pharmacy Records  venlafaxine  (EFFEXOR ) 100 MG tablet [id number]  Take 1 tablet (100 mg total) by mouth daily. Theophilus Andrews, Tully GRADE, MD  Active Child, Pharmacy Records           Med Note (CRUTHIS, CHLOE C   Wed Sep 08, 2023  7:14 AM)              Goals Addressed             This Visit's Progress    [facility] Transitions of Care (TOC) Care Plan       Problems:  Recent Hospitalization for treatment of Sepsis due to community-acquired pneumonia  09/23/2023  Spoke with daughter and patient via phone. Patient is feeling well. No cough. Has follow up planned with pulmonary next week. Denies shortness of breath today. 09/30/2023 Patient reports no signs shortness of breath except yesterday when he felt like he was having asthma. Used his inhaler and felt better.  Knowledge Deficit Related to Sepsis due to community-acquired pneumonia and heart failure  09/23/2023  Weight unchanged per daughters reports.  Not following low salt diet as patient likes to snack.  Nephrology placed on 2 liter fluid restriction, and changed lasix  to as needed.  Denies chest pain or swelling today. 09/30/2023  Reports that he has decreased his sodium intake. Feels better. No signs of infections.    Goal:  Over the next 30 days, the patient will not experience hospital readmission  Interventions:  Transitions of Care: Doctor Visits  - discussed the importance of doctor visits Reviewed Signs and symptoms of infection Reviewed pulmonary follow up Assessed pain Assessed weight Encouraged patient to  continue to use incentive spirometer.  Encouraged daughter to check BP Reviewed aspiration precautions. - stay upright after eating. Chew food well. Pay attention to swallowing.  Encouraged daughter to call eye doctor about blurred vision   Heart Failure Interventions: Provided education on low sodium diet- reviewed in detail the importance of low salt. Especially since lasix  is now PRN. Encouraged daughter and patient to read labels and find snack stuff that  is lower in salt.  Provided education about placing scale on hard, flat surface Discussed importance of daily weight and advised patient to weigh and record daily Discussed the importance of keeping all appointments with provider Reviewed difficulty with swallowing and pending stretch of esophagus  Patient Self Care Activities:  Call pharmacy for medication refills 3-7 days in advance of running out of medications Call provider office for new concerns or questions  Notify RN Care Manager of TOC call rescheduling needs Participate in Transition of Care Program/Attend TOC scheduled calls Take medications as prescribed   call office if I gain more than 2 pounds in one day or 5 pounds in one week use salt in moderation- read labels and make better choices for low salt. watch for swelling in feet, ankles and legs every day weigh myself daily Plan:  Telephone follow up appointment with care management team member scheduled for:  10/07/2023 at 11am  The patient has been provided with contact information for the care management team and has been advised to call with any health related questions or concerns.          Recommendation:   Continue Current Plan of Care  Follow Up Plan:   Telephone follow up appointment date/time:  10/07/2023   1100  Alan Ee, RN, BSN, [employer]- Transition of Care Team.  [facility] [phone]

## 2023-09-30 NOTE — Patient Instructions (Signed)
 Visit Information  Thank you for taking time to visit with me today. Please don't hesitate to contact me if I can be of assistance to you before our next scheduled telephone appointment.  Our next appointment is by telephone on 10/07/2023 at 11am  Following is a copy of your care plan:   Goals Addressed             This Visit's Progress    [facility] Transitions of Care (TOC) Care Plan       Problems:  Recent Hospitalization for treatment of Sepsis due to community-acquired pneumonia  09/23/2023  Spoke with daughter and patient via phone. Patient is feeling well. No cough. Has follow up planned with pulmonary next week. Denies shortness of breath today. 09/30/2023 Patient reports no signs shortness of breath except yesterday when he felt like he was having asthma. Used his inhaler and felt better.  Knowledge Deficit Related to Sepsis due to community-acquired pneumonia and heart failure  09/23/2023  Weight unchanged per daughters reports.  Not following low salt diet as patient likes to snack.  Nephrology placed on 2 liter fluid restriction, and changed lasix  to as needed.  Denies chest pain or swelling today. 09/30/2023  Reports that he has decreased his sodium intake. Feels better. No signs of infections.    Goal:  Over the next 30 days, the patient will not experience hospital readmission  Interventions:  Transitions of Care: Doctor Visits  - discussed the importance of doctor visits Reviewed Signs and symptoms of infection Reviewed pulmonary follow up Assessed pain Assessed weight Encouraged patient to continue to use incentive spirometer.  Encouraged daughter to check BP Reviewed aspiration precautions. - stay upright after eating. Chew food well. Pay attention to swallowing.  Encouraged daughter to call eye doctor about blurred vision   Heart Failure Interventions: Provided education on low sodium diet- reviewed in detail the importance of low salt. Especially since lasix  is now  PRN. Encouraged daughter and patient to read labels and find snack stuff that is lower in salt.  Provided education about placing scale on hard, flat surface Discussed importance of daily weight and advised patient to weigh and record daily Discussed the importance of keeping all appointments with provider Reviewed difficulty with swallowing and pending stretch of esophagus  Patient Self Care Activities:  Call pharmacy for medication refills 3-7 days in advance of running out of medications Call provider office for new concerns or questions  Notify RN Care Manager of TOC call rescheduling needs Participate in Transition of Care Program/Attend TOC scheduled calls Take medications as prescribed   call office if I gain more than 2 pounds in one day or 5 pounds in one week use salt in moderation- read labels and make better choices for low salt. watch for swelling in feet, ankles and legs every day weigh myself daily Plan:  Telephone follow up appointment with care management team member scheduled for:  10/07/2023 at 11am  The patient has been provided with contact information for the care management team and has been advised to call with any health related questions or concerns.         Patient verbalizes understanding of instructions and care plan provided today and agrees to view in MyChart. Active MyChart status and patient understanding of how to access instructions and care plan via MyChart confirmed with patient.     Telephone follow up appointment with care management team member scheduled for:  Please call the care guide team at [phone] if  you need to cancel or reschedule your appointment.   Please call the Suicide and Crisis Lifeline: 988 call the [place]  National Suicide Prevention Lifeline: (254)050-0642 or TTY: (503) 591-7738 TTY 808-088-7013) to talk to a trained counselor call 1-800-273-TALK (toll free, 24 hour hotline) call 911 if you are experiencing a Mental Health or  Behavioral Health Crisis or need someone to talk to.  Alan Ee, RN, BSN, CEN [employer]- Transition of Care Team.  [facility] [phone]

## 2023-10-01 NOTE — Therapy (Signed)
 OUTPATIENT PHYSICAL THERAPY NEURO EVALUATION   Patient Name: [name] MRN: [id number] DOB:[birth year], 83 y.o., male Today's Date: 10/04/2023   PCP: Theophilus Andrews, Tully GRADE, MD  REFERRING PROVIDER: Jonel Lonni SQUIBB, MD  END OF SESSION:  PT End of Session - 10/04/23 1237     Visit Number 1    Number of Visits 7    Date for PT Re-Evaluation 11/22/23    Authorization Type [employer] Medicare    Authorization Time Period auth submitted    PT Start Time 0934    PT Stop Time 1013    PT Time Calculation (min) 39 min    Equipment Utilized During Treatment Gait belt    Activity Tolerance Patient tolerated treatment well    Behavior During Therapy WFL for tasks assessed/performed          Past Medical History:  Diagnosis Date   Allergy     Anxiety    B12 deficiency anemia    Blood transfusion without reported diagnosis    CAD (coronary artery disease)    Cancer (HCC)    bladder, Right renal cell also   Chronic kidney disease    Colon polyps    COPD (chronic obstructive pulmonary disease) (HCC)    Depression    Diabetes mellitus (HCC)    Dyspnea    Esophagus, Barrett's    GERD (gastroesophageal reflux disease)    Headache    History of bladder cancer    Bladder cancer 8 times   History of hiatal hernia    Hyperlipidemia    Hypertension    Localized osteoarthrosis, lower leg    Myocardial infarction (HCC) 2021   Pneumonia    Restless leg syndrome    Sleep apnea    does not wear cpap   Stenosis of esophagus    Past Surgical History:  Procedure Laterality Date   ABDOMINAL AORTOGRAM N/A 08/03/2023   Procedure: ABDOMINAL AORTOGRAM;  Surgeon: Serene Gaile LELON, MD;  [facility];  Service: Cardiovascular;  Laterality: N/A;   BILIARY BRUSHING  04/01/2018   Procedure: BILIARY BRUSHING;  Surgeon: Wilhelmenia Aloha Raddle., MD;  Location: [facility] ENDOSCOPY;  Service: Gastroenterology;;   BILIARY BRUSHING  09/12/2018   Procedure: BILIARY BRUSHING;   Surgeon: Wilhelmenia Aloha Raddle., MD;  Location: [facility] ENDOSCOPY;  Service: Gastroenterology;;   BILIARY BRUSHING  11/28/2018   Procedure: BILIARY BRUSHING;  Surgeon: Wilhelmenia Aloha Raddle., MD;  Location: [facility] ENDOSCOPY;  Service: Gastroenterology;;   BILIARY BRUSHING  03/11/2020   Procedure: BILIARY BRUSHING;  Surgeon: Wilhelmenia Aloha Raddle., MD;  Location: [facility] ENDOSCOPY;  Service: Gastroenterology;;   BILIARY DILATION  09/12/2018   Procedure: BILIARY DILATION;  Surgeon: Wilhelmenia Aloha Raddle., MD;  Location: [facility] ENDOSCOPY;  Service: Gastroenterology;;   BILIARY DILATION  11/28/2018   Procedure: BILIARY DILATION;  Surgeon: Wilhelmenia Aloha Raddle., MD;  Location: [facility] ENDOSCOPY;  Service: Gastroenterology;;   BILIARY DILATION  03/11/2020   Procedure: BILIARY DILATION;  Surgeon: Wilhelmenia Aloha Raddle., MD;  Location: [facility] ENDOSCOPY;  Service: Gastroenterology;;   BILIARY STENT PLACEMENT  04/01/2018   Procedure: BILIARY STENT PLACEMENT;  Surgeon: Wilhelmenia Aloha Raddle., MD;  Location: [facility] ENDOSCOPY;  Service: Gastroenterology;;   BILIARY STENT PLACEMENT  09/12/2018   Procedure: BILIARY STENT PLACEMENT;  Surgeon: Wilhelmenia Aloha Raddle., MD;  Location: [facility] ENDOSCOPY;  Service: Gastroenterology;;   BILIARY STENT PLACEMENT  11/28/2018   Procedure: BILIARY STENT PLACEMENT;  Surgeon: Wilhelmenia Aloha Raddle., MD;  Location: [facility] ENDOSCOPY;  Service: Gastroenterology;;   BIOPSY  04/01/2018  Procedure: BIOPSY;  Surgeon: Wilhelmenia Aloha Raddle., MD;  Location: [facility] ENDOSCOPY;  Service: Gastroenterology;;   BIOPSY  09/12/2018   Procedure: BIOPSY;  Surgeon: Wilhelmenia Aloha Raddle., MD;  Location: [facility] ENDOSCOPY;  Service: Gastroenterology;;   BIOPSY  11/28/2018   Procedure: BIOPSY;  Surgeon: Wilhelmenia Aloha Raddle., MD;  Location: [facility] ENDOSCOPY;  Service: Gastroenterology;;   BIOPSY  03/11/2020   Procedure: BIOPSY;  Surgeon: Wilhelmenia Aloha Raddle., MD;  Location: [facility] ENDOSCOPY;  Service: Gastroenterology;;   bladder  cancer      x 8 cystoscopy   CERVICAL DISCECTOMY     ACDF   CHOLECYSTECTOMY     COLONOSCOPY  11/17/2005   normal    CORONARY ARTERY BYPASS GRAFT     x4   CORONARY STENT INTERVENTION N/A 01/05/2019   Procedure: CORONARY STENT INTERVENTION;  Surgeon: Mady Bruckner, MD;  [facility];  Service: Cardiovascular;  Laterality: N/A;   CYSTOSCOPY WITH BIOPSY N/A 03/27/2022   Procedure: CYSTOSCOPY WITH BIOPSY;  Surgeon: Watt Rush, MD;  Location: [facility] ORS;  Service: Urology;  Laterality: N/A;   CYSTOSCOPY WITH RETROGRADE PYELOGRAM, URETEROSCOPY AND STENT PLACEMENT Right 03/27/2022   Procedure: CYSTOSCOPY WITH RIGHT RETROGRADE PYELOGRAM, URETEROSCOPY AND STENT PLACEMENT urethral dilation;  Surgeon: Watt Rush, MD;  Location: [facility] ORS;  Service: Urology;  Laterality: Right;   ENDOSCOPIC MUCOSAL RESECTION  09/12/2018   Procedure: ENDOSCOPIC MUCOSAL RESECTION;  Surgeon: Wilhelmenia Aloha Raddle., MD;  Location: [facility] ENDOSCOPY;  Service: Gastroenterology;;   ENDOSCOPIC RETROGRADE CHOLANGIOPANCREATOGRAPHY (ERCP) WITH PROPOFOL  N/A 04/01/2018   Procedure: ENDOSCOPIC RETROGRADE CHOLANGIOPANCREATOGRAPHY (ERCP) WITH PROPOFOL ;  Surgeon: Wilhelmenia Aloha Raddle., MD;  Location: [facility] ENDOSCOPY;  Service: Gastroenterology;  Laterality: N/A;   ENDOSCOPIC RETROGRADE CHOLANGIOPANCREATOGRAPHY (ERCP) WITH PROPOFOL  N/A 09/12/2018   Procedure: ENDOSCOPIC RETROGRADE CHOLANGIOPANCREATOGRAPHY (ERCP) WITH PROPOFOL ;  Surgeon: Wilhelmenia Aloha Raddle., MD;  Location: [facility] ENDOSCOPY;  Service: Gastroenterology;  Laterality: N/A;   ENDOSCOPIC RETROGRADE CHOLANGIOPANCREATOGRAPHY (ERCP) WITH PROPOFOL  N/A 03/11/2020   Procedure: ENDOSCOPIC RETROGRADE CHOLANGIOPANCREATOGRAPHY (ERCP) WITH PROPOFOL ;  Surgeon: Wilhelmenia Aloha Raddle., MD;  Location[facility] ENDOSCOPY;  Service: Gastroenterology;  Laterality: N/A;   ENDOSCOPIC RETROGRADE CHOLANGIOPANCREATOGRAPHY (ERCP) WITH PROPOFOL  N/A 06/03/2020   Procedure: ENDOSCOPIC RETROGRADE  CHOLANGIOPANCREATOGRAPHY (ERCP) WITH PROPOFOL ;  Surgeon: Wilhelmenia Aloha Raddle., MD;  Location: [facility] ENDOSCOPY;  Service: Gastroenterology;  Laterality: N/A;   ERCP N/A 11/28/2018   Procedure: ENDOSCOPIC RETROGRADE CHOLANGIOPANCREATOGRAPHY (ERCP) +EGD with spyglass;  Surgeon: Wilhelmenia Aloha Raddle., MD;  Location: [facility] ENDOSCOPY;  Service: Gastroenterology;  Laterality: N/A;   ESOPHAGOGASTRODUODENOSCOPY  04/29/2010   ESOPHAGOGASTRODUODENOSCOPY (EGD) WITH PROPOFOL  N/A 04/01/2018   Procedure: ESOPHAGOGASTRODUODENOSCOPY (EGD) WITH PROPOFOL ;  Surgeon: Wilhelmenia Aloha Raddle., MD;  Location: [facility] ENDOSCOPY;  Service: Gastroenterology;  Laterality: N/A;   ESOPHAGOGASTRODUODENOSCOPY (EGD) WITH PROPOFOL  N/A 09/12/2018   Procedure: ESOPHAGOGASTRODUODENOSCOPY (EGD) WITH PROPOFOL ;  Surgeon: Wilhelmenia Aloha Raddle., MD;  Location: [facility] ENDOSCOPY;  Service: Gastroenterology;  Laterality: N/A;   ESOPHAGOGASTRODUODENOSCOPY (EGD) WITH PROPOFOL  N/A 11/28/2018   Procedure: ESOPHAGOGASTRODUODENOSCOPY (EGD) WITH PROPOFOL ;  Surgeon: Wilhelmenia Aloha Raddle., MD;  Location: [facility] ENDOSCOPY;  Service: Gastroenterology;  Laterality: N/A;   ESOPHAGOGASTRODUODENOSCOPY (EGD) WITH PROPOFOL  N/A 03/11/2020   Procedure: ESOPHAGOGASTRODUODENOSCOPY (EGD) WITH PROPOFOL ;  Surgeon: Wilhelmenia Aloha Raddle., MD;  [facility] ENDOSCOPY;  Service: Gastroenterology;  Laterality: N/A;   ESOPHAGOGASTRODUODENOSCOPY (EGD) WITH PROPOFOL  N/A 06/03/2020   Procedure: ESOPHAGOGASTRODUODENOSCOPY (EGD) WITH PROPOFOL ;  Surgeon: Wilhelmenia Aloha Raddle., MD;  Location[facility] ENDOSCOPY;  Service: Gastroenterology;  Laterality: N/A;   EUS  04/01/2018   Procedure: FULL UPPER ENDOSCOPIC ULTRASOUND (EUS) RADIAL;  Surgeon: Wilhelmenia Aloha Raddle., MD;  Location: [facility] ENDOSCOPY;  Service: Gastroenterology;;   EUS N/A 09/12/2018   Procedure: UPPER ENDOSCOPIC ULTRASOUND (EUS) RADIAL;  Surgeon: Wilhelmenia Aloha Raddle., MD;  Location: [facility] ENDOSCOPY;  Service: Gastroenterology;   Laterality: N/A;   FINE NEEDLE ASPIRATION  09/12/2018   Procedure: FINE NEEDLE ASPIRATION (FNA) LINEAR;  Surgeon: Wilhelmenia Aloha Raddle., MD;  Location: [facility] ENDOSCOPY;  Service: Gastroenterology;;   HAND SURGERY Left 2018   saw accident   HEMOSTASIS CLIP PLACEMENT  09/12/2018   Procedure: HEMOSTASIS CLIP PLACEMENT;  Surgeon: Wilhelmenia Aloha Raddle., MD;  Location: [facility] ENDOSCOPY;  Service: Gastroenterology;;   HEMOSTASIS CLIP PLACEMENT  06/03/2020   Procedure: HEMOSTASIS CLIP PLACEMENT;  Surgeon: Wilhelmenia Aloha Raddle., MD;  Location: [facility] ENDOSCOPY;  Service: Gastroenterology;;   I & D EXTREMITY Left 11/24/2016   Procedure: IRRIGATION AND DEBRIDEMENT LEFT HAND, THUMB, INDEX, MIDDLE, RING, AND SMALL FINGERS WITH RECONSTRUCTION;  Surgeon: Camella Fallow, MD;  Location: [facility];  Service: Orthopedics;  Laterality: Left;   KNEE ARTHROSCOPY Left    LEFT HEART CATH AND CORS/GRAFTS ANGIOGRAPHY N/A 01/05/2019   Procedure: LEFT HEART CATH AND CORS/GRAFTS ANGIOGRAPHY;  Surgeon: Mady Bruckner, MD;  [facility];  Service: Cardiovascular;  Laterality: N/A;   LUMBAR LAMINECTOMY     and fusion x 2   NASAL SINUS SURGERY     POPLITEAL SYNOVIAL CYST EXCISION     REMOVAL OF STONES  04/01/2018   Procedure: REMOVAL OF STONES;  Surgeon: Wilhelmenia Aloha Raddle., MD;  Location: [facility] ENDOSCOPY;  Service: Gastroenterology;;   REMOVAL OF STONES  09/12/2018   Procedure: REMOVAL OF STONES;  Surgeon: Wilhelmenia Aloha Raddle., MD;  Location: [facility] ENDOSCOPY;  Service: Gastroenterology;;   REMOVAL OF STONES  11/28/2018   Procedure: REMOVAL OF STONES;  Surgeon: Wilhelmenia Aloha Raddle., MD;  Location: [facility] ENDOSCOPY;  Service: Gastroenterology;;   REMOVAL OF STONES  03/11/2020   Procedure: REMOVAL OF STONES;  Surgeon: Wilhelmenia Aloha Raddle., MD;  Location: [facility] ENDOSCOPY;  Service: Gastroenterology;;   REMOVAL OF STONES  06/03/2020   Procedure: REMOVAL OF STONES;  Surgeon: Wilhelmenia Aloha Raddle., MD;  Location: [facility]  ENDOSCOPY;  Service: Gastroenterology;;   ROBOT ASSITED LAPAROSCOPIC NEPHROURETERECTOMY Right 06/03/2022   Procedure: XI ROBOT ASSITED LAPAROSCOPIC NEPHROURETERECTOMY, FLEXIBLE CYSTOSCOPY;  Surgeon: Alvaro Ricardo KATHEE Raddle., MD;  Location: [facility] ORS;  Service: Urology;  Laterality: Right;  3 HRS   SAVORY DILATION N/A 09/12/2018   Procedure: SAVORY DILATION;  Surgeon: Wilhelmenia Aloha Raddle., MD;  Location: [facility] ENDOSCOPY;  Service: Gastroenterology;  Laterality: N/A;   SAVORY DILATION N/A 11/28/2018   Procedure: SAVORY DILATION;  Surgeon: Wilhelmenia Aloha Raddle., MD;  Location: [facility] ENDOSCOPY;  Service: Gastroenterology;  Laterality: N/A;   SEPTOPLASTY Bilateral 05/26/2021   Procedure: SEPTOPLASTY;  Surgeon: Karis Clunes, MD;  Location: [facility];  Service: ENT;  Laterality: Bilateral;   SPHINCTEROTOMY  04/01/2018   Procedure: ANNETT;  Surgeon: Mansouraty, Aloha Raddle., MD;  Location: [facility] ENDOSCOPY;  Service: Gastroenterology;;   LAHOMA CHOLANGIOSCOPY N/A 11/28/2018   Procedure: DEBHOJDD CHOLANGIOSCOPY;  Surgeon: Wilhelmenia Aloha Raddle., MD;  Location: [facility] ENDOSCOPY;  Service: Gastroenterology;  Laterality: N/A;   SPYGLASS CHOLANGIOSCOPY N/A 06/03/2020   Procedure: SPYGLASS CHOLANGIOSCOPY;  Surgeon: Wilhelmenia Aloha Raddle., MD;  Location[facility] ENDOSCOPY;  Service: Gastroenterology;  Laterality: N/A;   STENT REMOVAL  09/12/2018   Procedure: STENT REMOVAL;  Surgeon: Wilhelmenia Aloha Raddle., MD;  Location: [facility] ENDOSCOPY;  Service: Gastroenterology;;   STENT REMOVAL  11/28/2018   Procedure: CLEDA  REMOVAL;  Surgeon: Wilhelmenia Aloha Raddle., MD;  Location: [facility] ENDOSCOPY;  Service: Gastroenterology;;   CLEDA REMOVAL  03/11/2020   Procedure: STENT REMOVAL;  Surgeon: Wilhelmenia Aloha Raddle., MD;  Location: [facility]OSCOPY;  Service: Gastroenterology;;   ROBLEY MEYER INJECTION  09/12/2018   Procedure: SUBMUCOSAL LIFTING INJECTION;  Surgeon: Wilhelmenia Aloha Raddle., MD;  Location: [facility] ENDOSCOPY;   Service: Gastroenterology;;   Patient Active Problem List   Diagnosis Date Noted   Typical atrial flutter (HCC) 09/13/2023   CHF exacerbation (HCC) 09/08/2023   Elevated brain natriuretic peptide (BNP) level 09/08/2023   CKD (chronic kidney disease), stage IV (HCC) 09/08/2023   History of CAD (coronary artery disease) 09/08/2023   History of anxiety 09/08/2023   History of renal cell cancer s/p  nephrectomy 09/08/2023   Chronic pain syndrome 09/08/2023   C. 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   Bloating 04/27/2020   Gas pain 04/27/2020   Abdominal cramping 04/27/2020   Functional abdominal pain syndrome 11/12/2019   Lower extremity edema 11/12/2019   Hyperlipidemia    History of cholecystectomy 01/19/2019   Chronic diarrhea 01/19/2019   Antiplatelet or antithrombotic long-term use 01/19/2019   Non-ST elevation (NSTEMI) myocardial infarction [facility])    Acute hypoxemic respiratory failure (HCC) 01/04/2019   Multifocal pneumonia 01/04/2019   Sepsis due to pneumonia (HCC) 01/04/2019   Elevated troponin 01/04/2019   Acute on chronic diastolic (congestive) heart failure (HCC) 01/04/2019   Acute respiratory failure (HCC) 01/04/2019   elevated IgG4 11/01/2018   Choledocholithiasis 10/27/2018   Dilation of biliary tract 10/27/2018   Esophageal dysphagia 10/27/2018   Upper airway cough syndrome 07/28/2018   Pleural effusion on left 07/28/2018   History of pancreatitis 05/12/2018   Abnormal LFTs 05/12/2018   Abnormal findings on esophagogastroduodenoscopy (EGD) 05/12/2018   History of biliary stent insertion 05/12/2018   Anemia 05/12/2018   Biliary stricture    ESBL (extended spectrum beta-lactamase) producing bacteria infection 04/04/2018   Ascending cholangitis 04/01/2018   Diabetes mellitus type 2, noninsulin dependent (HCC) 03/31/2018   Pancreatitis 03/31/2018   Sepsis (HCC) 03/31/2018    Bacteremia due to Gram-negative bacteria 03/31/2018   Abdominal pain    Dilated bile duct    Acute biliary pancreatitis 03/30/2018   Leukocytosis 03/30/2018   CAD (coronary artery disease) 03/30/2018   Osteoarthritis of left knee 10/18/2017   Cervical radiculopathy 10/04/2017   Pain of left hand 07/19/2017   Pre-operative cardiovascular examination 06/16/2017   CKD (chronic kidney disease), stage III 06/16/2017   Carpal tunnel syndrome 05/27/2017   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 05/10/2017   Open fracture of base of middle phalanx of finger 02/17/2017   Laceration of index finger 02/17/2017   Laceration of nail bed of finger 02/17/2017   Laceration of thumb 02/17/2017   Open fracture of distal phalanx of finger 02/17/2017   Open fractures of multiple sites of phalanx of left hand 11/24/2016   B12 deficiency 05/28/2015   GERD (gastroesophageal reflux disease) 11/16/2014   Hyperlipidemia LDL goal <70 11/21/2013   DOE (dyspnea on exertion) 11/21/2013   Other dysphagia 07/14/2013   History of esophageal stricture 07/14/2013   Hx of CABG 06/05/2013   Leg pain, bilateral 10/18/2012   Restless leg syndrome 06/21/2012   ULNAR NEUROPATHY, LEFT 08/05/2009   Urinary obstruction 10/19/2008   ACTINIC KERATOSIS 06/25/2008   DRY EYE SYNDROME 10/13/2007   CARCINOMA, BLADDER, HX OF 07/02/2007   BARRETTS  ESOPHAGUS 05/23/2007   Hypothyroidism 04/08/2007   Other malaise and fatigue 04/08/2007   Essential hypertension 10/12/2006   ANEMIA, B12 DEFICIENCY 09/08/2006   Depression 07/29/2006   NEUROPATHY, IDIOPATHIC PERIPHERAL NEC 07/29/2006   Allergic rhinitis 07/29/2006   LOW BACK PAIN 07/29/2006   COLONIC POLYPS 11/04/2000    ONSET DATE: 8/19/250   REFERRING DIAG: J18.9 (ICD-10-CM) - Community acquired pneumonia of left lower lobe of lung  THERAPY DIAG:  Difficulty in walking, not elsewhere classified  Unsteadiness on feet  Muscle weakness (generalized)  Other symptoms and  signs involving the musculoskeletal system  Rationale for Evaluation and Treatment: Habilitation  SUBJECTIVE:                                                                                                                                                                                             SUBJECTIVE STATEMENT: I'm much better since I was in the hospital. Able to do some things around the house like sowing and watering grass, riding the lawnmower. Reports that he has not left his house much for the past 3 years d/t chronic pain from neck all the way down to my feet as well as SOB.    Pt accompanied by: self  PERTINENT HISTORY: Anxiety, CAD, bladder CA, CKD, COPD, depression, DM, hiatal hernia, HLD, HTN, MI 2021, cervical discectomy, CABG x4, lumbar laminectomy and fusion  PAIN:  Are you having pain? Pt reports chronic generalized pain- currently 7-8/10 with meds    PRECAUTIONS: Fall  RED FLAGS: None   WEIGHT BEARING RESTRICTIONS: No  FALLS: Has patient fallen in last 6 months? Yes. Number of falls 2-3  LIVING ENVIRONMENT: Lives with: lives alone Lives in: House/apartment Stairs: 2 steps to enter; 3 story home; bedroom on main floor Has following equipment at home: Single point cane, Environmental consultant - 4 wheeled, Crutches, Tour manager, and Grab bars  PLOF: Independent with basic ADLs and has a cleaning service and daughter assists with laundry and med management   PATIENT GOALS: I'm not really sure you can help me  OBJECTIVE:  Note: Objective measures were completed at Evaluation unless otherwise noted.  DIAGNOSTIC FINDINGS: none recent    Vitals: 92% spO2, 71 bpm  COGNITION: Overall cognitive status: Within functional limits for tasks assessed   SENSATION: Pt reports numbness in B feet   POSTURE: rounded shoulders, forward head, and increased thoracic kyphosis  LOWER EXTREMITY ROM:     Active  Right Eval Left Eval  Hip flexion    Hip extension    Hip  abduction    Hip adduction    Hip internal rotation  Hip external rotation    Knee flexion    Knee extension    Ankle dorsiflexion 16 7  Ankle plantarflexion    Ankle inversion    Ankle eversion     (Blank rows = not tested)  LOWER EXTREMITY MMT:    MMT (in sitting) Right Eval Left Eval  Hip flexion 4+ 4  Hip extension    Hip abduction 4+ 4+  Hip adduction 4 4  Hip internal rotation    Hip external rotation    Knee flexion 5 5  Knee extension 5 5  Ankle dorsiflexion 4+ 4  Ankle plantarflexion 4+ 3+  Ankle inversion    Ankle eversion    (Blank rows = not tested)   GAIT: Findings: Assistive device utilized:None, Level of assistance: SBA, and Comments: trunk flexed and limited arm swing, decreased R>L foot clearance, slowed speed  After ambulating 176ft: 88% spO2, 88bpm   FUNCTIONAL TESTS:  5 times sit to stand: 32.18 sec pushing off knees and not standing fully  10 meter walk test: 17.43 sec (1.89 ft/sec) Vitals: 93% spO2, 83bpm                                                                                                                               TREATMENT DATE: 10/03/24    PATIENT EDUCATION: Education details: prognosis, POC, edu on benefits of PT to address pt's exam deficits as they relate to functional impairments, HEP Person educated: Patient Education method: Explanation, Demonstration, Tactile cues, Verbal cues, and Handouts Education comprehension: verbalized understanding and returned demonstration  HOME EXERCISE PROGRAM: Access Code: [email or web address] URL: [email or web address] Date: 10/04/2023 Prepared by: [facility] - Outpatient  Rehab - [facility]  Program Notes Monitor your oxygen levels- generally want at least 90%  Exercises - Sit to Stand with Hands on Knees  - 1 x daily - 5 x weekly - 2 sets - 5 reps - Standing March with Unilateral Counter Support  - 1 x daily - 5 x weekly - 2 sets - 20 reps - Side Stepping with Counter  Support  - 1 x daily - 5 x weekly - 2-3 sets - 1 min hold    GOALS: Goals reviewed with patient? Yes  SHORT TERM GOALS: Target date: 10/25/2023  Patient to be independent with initial HEP. Baseline: HEP initiated Goal status: INITIAL    LONG TERM GOALS: Target date: 11/22/23  Patient to be independent with advanced HEP. Baseline: Not yet initiated  Goal status: INITIAL  Patient to demonstrate B LE strength >/=4+/5.  Baseline: See above Goal status: INITIAL  Patient to score at least 20/24 on DGI in order to decrease risk of falls.  Baseline: NT Goal status: INITIAL  Patient to demonstrate 5xSTS test in <20 sec in order to decrease risk of falls.  Baseline: 32.18 sec pushing off knees and not standing fully Goal status: INITIAL  Patient to ambulate at least 250 ft with SpO2 at  least 90% on room air and without excessive SOB. Baseline: After ambulating 159ft: 88% spO2, 88bpm  Goal status: INITIAL  Patient to demonstrate gait speed of at least 2.3 ft/sec in order to improve access to community. Baseline: 1.89 ft/sec Goal status: INITIAL   ASSESSMENT:  CLINICAL IMPRESSION:  Patient is an 83 y/o M presenting to OPPT with c/o limited activity levels s/p hospitalization 09/07/23-09/14/23 for pneumonia and CHF flare. Patient today presenting with Flexed posture, mild LE weakness, gait deviations, decreased gait speed and transfer speed, and poor endurance. Patient was educated on gentle endurance HEP and reported understanding.  Would benefit from skilled PT services 1 x/week for 6 weeks to address aforementioned impairments in order to optimize level of function.    OBJECTIVE IMPAIRMENTS: Abnormal gait, decreased activity tolerance, decreased balance, decreased endurance, decreased mobility, difficulty walking, decreased strength, postural dysfunction, and pain.   ACTIVITY LIMITATIONS: carrying, lifting, bending, sitting, standing, squatting, sleeping, stairs, transfers, bed  mobility, bathing, toileting, dressing, reach over head, hygiene/grooming, and locomotion level  PARTICIPATION LIMITATIONS: meal prep, cleaning, laundry, shopping, community activity, yard work, and church  PERSONAL FACTORS: Age, Financial risk analyst, Past/current experiences, Time since onset of injury/illness/exacerbation, Transportation, and 3+ comorbidities: Anxiety, CAD, bladder CA, CKD, COPD, depression, DM, hiatal hernia, HLD, HTN, MI 2021, cervical discectomy, CABG x4, lumbar laminectomy and fusion are also affecting patient's functional outcome.   REHAB POTENTIAL: Good  CLINICAL DECISION MAKING: Evolving/moderate complexity  EVALUATION COMPLEXITY: Moderate  PLAN:  PT FREQUENCY: 1x/week  PT DURATION: 6 weeks  PLANNED INTERVENTIONS: 97164- PT Re-evaluation, 97750- Physical Performance Testing, 97110-Therapeutic exercises, 97530- Therapeutic activity, 97112- Neuromuscular re-education, 97535- Self Care, [id number]- Manual therapy, [phone]- Gait training, [phone]- Canalith repositioning, [id number]- Aquatic Therapy, [phone]- Electrical stimulation (manual), [phone] (1-2 muscles), [phone] (3+ muscles)- Dry Needling, Patient/Family education, Balance training, Stair training, Taping, Joint mobilization, Spinal mobilization, Vestibular training, Cryotherapy, and Moist heat  PLAN FOR NEXT SESSION: DGI; progress HEP for endurance, balance, transfers    Louana Terrilyn Christians, PT, DPT 10/04/23 12:46 PM  [facility] at [facility] [address], [address] [place], [place] [address] Phone # [phone] Fax # [phone]

## 2023-10-04 ENCOUNTER — Other Ambulatory Visit: Payer: Self-pay

## 2023-10-04 ENCOUNTER — Encounter: Payer: Self-pay | Admitting: Physical Therapy

## 2023-10-04 ENCOUNTER — Ambulatory Visit: Attending: Family Medicine | Admitting: Physical Therapy

## 2023-10-04 DIAGNOSIS — R2681 Unsteadiness on feet: Secondary | ICD-10-CM | POA: Diagnosis not present

## 2023-10-04 DIAGNOSIS — R262 Difficulty in walking, not elsewhere classified: Secondary | ICD-10-CM | POA: Diagnosis not present

## 2023-10-04 DIAGNOSIS — J189 Pneumonia, unspecified organism: Secondary | ICD-10-CM | POA: Insufficient documentation

## 2023-10-04 DIAGNOSIS — R29898 Other symptoms and signs involving the musculoskeletal system: Secondary | ICD-10-CM | POA: Diagnosis not present

## 2023-10-04 DIAGNOSIS — M6281 Muscle weakness (generalized): Secondary | ICD-10-CM | POA: Insufficient documentation

## 2023-10-07 ENCOUNTER — Telehealth: Payer: Self-pay

## 2023-10-07 ENCOUNTER — Ambulatory Visit: Payer: Self-pay | Admitting: Adult Health

## 2023-10-07 NOTE — Progress Notes (Signed)
 Called and spoke with pts daughter Leeroy in dpr regarding Mr. Whisner cxr results. She confirmed her understanding nfn

## 2023-10-08 ENCOUNTER — Other Ambulatory Visit: Payer: Self-pay

## 2023-10-08 NOTE — Transitions of Care (Post Inpatient/ED Visit) (Signed)
 Transition of Care week 4  Visit Note  10/08/2023  Name: [name] MRN: [id number]          DOB: [birth year]  Situation: Patient enrolled in [facility] 30-day program. Visit completed with patient by telephone.   Background:   Initial Transition Care Management Follow-up Telephone Call    Past Medical History:  Diagnosis Date   Allergy     Anxiety    B12 deficiency anemia    Blood transfusion without reported diagnosis    CAD (coronary artery disease)    Cancer (HCC)    bladder, Right renal cell also   Chronic kidney disease    Colon polyps    COPD (chronic obstructive pulmonary disease) (HCC)    Depression    Diabetes mellitus (HCC)    Dyspnea    Esophagus, Barrett's    GERD (gastroesophageal reflux disease)    Headache    History of bladder cancer    Bladder cancer 8 times   History of hiatal hernia    Hyperlipidemia    Hypertension    Localized osteoarthrosis, lower leg    Myocardial infarction (HCC) 2021   Pneumonia    Restless leg syndrome    Sleep apnea    does not wear cpap   Stenosis of esophagus     Assessment: Reports he got up at 4am and took some medications and went back to bed. Recently having some shortness of breath and using his inhaler. No swelling and no weight gain. Denies any new problems or concerns today.  Patient Reported Symptoms: Cognitive Cognitive Status: Able to follow simple commands, Alert and oriented to person, place, and time, Normal speech and language skills      Neurological Neurological Review of Symptoms: No symptoms reported    HEENT HEENT Symptoms Reported: No symptoms reported      Cardiovascular Cardiovascular Symptoms Reported: No symptoms reported Weight: 149 lb (67.6 kg) (home monitoring)  Respiratory Respiratory Symptoms Reported: Shortness of breath Other Respiratory Symptoms: Has had some occasional shortness of breath. using his inhaler as needed but mostly daily    Endocrine Endocrine Symptoms Reported:  No symptoms reported Is patient diabetic?: No    Gastrointestinal Gastrointestinal Symptoms Reported: Other Other Gastrointestinal Symptoms: feels empty. Gastrointestinal Comment: Reports bowels moving well.  Reviewed eating more frequent small meals.    Genitourinary Genitourinary Symptoms Reported: No symptoms reported    Integumentary Integumentary Symptoms Reported: Itching    Musculoskeletal Musculoskelatal Symptoms Reviewed: Joint pain Additional Musculoskeletal Details: chronic pain        Psychosocial Psychosocial Symptoms Reported: No symptoms reported         Today's Vitals   10/08/23 1013  Weight: 149 lb (67.6 kg)  PainSc: 3      Medications Reviewed Today     Reviewed by Rumalda Alan PENNER, RN (Registered Nurse) on 10/08/23 at 1023  Med List Status: <None>   Medication Order Taking? Sig Documenting Provider Last Dose Status Informant  acetaminophen  (TYLENOL ) 500 MG tablet [id number] Yes Take 1,000 mg by mouth daily as needed for moderate pain (pain score 4-6) or mild pain (pain score 1-3). [provider]  Active Child, Pharmacy Records  albuterol  (VENTOLIN  HFA) 108 [phone] Base) MCG/ACT inhaler [id number] Yes Inhale 2 puffs into the lungs every 6 (six) hours as needed for wheezing or shortness of breath. Hunsucker, Donnice SAUNDERS, MD  Active Child, Pharmacy Records  amLODipine  (NORVASC ) 5 MG tablet [id number] Yes TAKE 1 TABLET BY MOUTH DAILY Theophilus Andrews, Tully GRADE,  MD  Active Child, Pharmacy Records  apixaban  (ELIQUIS ) 2.5 MG TABS tablet [id number] Yes Take 1 tablet (2.5 mg total) by mouth 2 (two) times daily. Jonel Lonni SQUIBB, MD  Active   clopidogrel  (PLAVIX ) 75 MG tablet [id number] Yes TAKE 1 TABLET BY MOUTH ONCE  DAILY Burnard Debby LABOR, MD  Active Child, Pharmacy Records           Med Note (CRUTHIS, CHLOE C   Wed Sep 08, 2023  8:11 AM) Has been holding for procedure.   Coenzyme Q10 (CO Q-10) 300 MG CAPS [id number] Yes Take 300 mg by mouth every evening.  [provider]  Active Child, Pharmacy Records  cyanocobalamin  (VITAMIN B12) 1000 MCG/ML injection [id number] Yes Inject 1 mL (1,000 mcg total) into the muscle every 30 (thirty) days. Theophilus Andrews, Tully GRADE, MD  Active Child, Pharmacy Records  cyclobenzaprine  (FLEXERIL ) 5 MG tablet [id number] Yes Take 1 tablet (5 mg total) by mouth at bedtime as needed for muscle spasms. Theophilus Andrews, Tully GRADE, MD  Active Child, Pharmacy Records           Med Note (CRUTHIS, SHEFFIELD JAYSON [place] Sep 08, 2023  7:14 AM)    ezetimibe  (ZETIA ) 10 MG tablet [id number] Yes TAKE 1 TABLET BY MOUTH DAILY Theophilus Andrews, Tully GRADE, MD  Active Child, Pharmacy Records  ferrous sulfate 325 (65 FE) MG EC tablet [id number] Yes Take 325 mg by mouth daily. [provider]  Active Child, Pharmacy Records  fluticasone  (FLONASE ) 50 MCG/ACT nasal spray [id number] Yes Place 1 spray into both nostrils daily. [provider]  Active Child, Pharmacy Records  Fluticasone -Umeclidin-Vilant (TRELEGY ELLIPTA ) 200-62.5-25 MCG/ACT AEPB [id number] Yes Inhale 1 puff into the lungs daily. Hunsucker, Donnice SAUNDERS, MD  Active Child, Pharmacy Records  furosemide  (LASIX ) 20 MG tablet [id number] Yes Take 1 tablet (20 mg total) by mouth every other day. Jonel Lonni SQUIBB, MD  Active   isosorbide  mononitrate (IMDUR ) 30 MG 24 hr tablet [id number] Yes Take 1 tablet (30 mg total) by mouth daily. Jonel Lonni SQUIBB, MD  Active   Menthol, Topical Analgesic, [facility] EX) [id number] Yes Apply 1 application topically daily as needed (pain). [provider]  Active Child, Pharmacy Records  Multiple Vitamin (MULTIVITAMIN) tablet [id number] Yes Take 1 tablet by mouth daily. [provider]  Active Child, Pharmacy Records  nebivolol  (BYSTOLIC ) 10 MG tablet [id number] Yes Take 0.5 tablets (5 mg total) by mouth daily. Jonel Lonni SQUIBB, MD  Active   nitroGLYCERIN  (NITROSTAT ) 0.4 MG SL tablet [id number] Yes PLACE 1 TABLET UNDER THE  TONGUE EVERY 5 MINUTES AS NEEDED FOR CHEST PAIN Theophilus Andrews, Tully GRADE, MD  Active   Nystatin (GERHARDT'S BUTT CREAM) CREA [id number] Yes Apply 1 Application topically daily. Danford, Lonni SQUIBB, MD  Active   oxyCODONE -acetaminophen  (PERCOCET) 10-325 MG tablet [id number] Yes Take 1 tablet by mouth 2 (two) times daily. Theophilus Andrews, Tully GRADE, MD  Active   oxyCODONE -acetaminophen  (PERCOCET) 10-325 MG tablet [id number] Yes Take 1 tablet by mouth 2 (two) times daily. Theophilus Andrews, Tully GRADE, MD  Active   oxyCODONE -acetaminophen  (PERCOCET) 10-325 MG tablet [id number] Yes Take 1 tablet by mouth 2 (two) times daily. Theophilus Andrews, Tully GRADE, MD  Active   pantoprazole  (PROTONIX ) 40 MG tablet [id number] Yes Take 1 tablet (40 mg total) by mouth daily. Craig Alan SAUNDERS, PA-C  Active Child, Pharmacy Records  prednisoLONE acetate (PRED FORTE) 1 % ophthalmic suspension [id number] Yes Place 1 drop into both eyes 4 (four) times daily.  [provider]  Active Child, Pharmacy Records           Med Note (CRUTHIS, CHLOE C   Wed Sep 08, 2023  8:24 AM) Per daughter the pt randomly took this medication out of his medication box and decided to start taking it again on 09/07/23. This prescription is from December of 2024 per daughter. No fill hx found.   rOPINIRole  (REQUIP ) 2 MG tablet [id number] Yes TAKE 1 TABLET BY MOUTH AT  BEDTIME Theophilus Andrews, Tully GRADE, MD  Active Child, Pharmacy Records  rosuvastatin  (CRESTOR ) 10 MG tablet [id number] Yes TAKE 1 TABLET BY MOUTH DAILY Burnard Debby LABOR, MD  Active Child, Pharmacy Records  saccharomyces boulardii [facility]) 250 MG capsule [id number] Yes Take 250 mg by mouth every evening. [provider]  Active Child, Pharmacy Records  sodium zirconium cyclosilicate  (LOKELMA ) 5 g packet [id number] Yes Take 5 g by mouth daily. [provider]  Active Child, Pharmacy Records  venlafaxine  (EFFEXOR ) 100 MG tablet [id number] Yes Take 1 tablet (100 mg total) by  mouth daily. Theophilus Andrews, Tully GRADE, MD  Active Child, Pharmacy Records           Med Note (CRUTHIS, CHLOE C   Wed Sep 08, 2023  7:14 AM)               Goals Addressed             This Visit's Progress    [facility] Transitions of Care (TOC) Care Plan       Problems:  Recent Hospitalization for treatment of Sepsis due to community-acquired pneumonia  09/23/2023  Spoke with daughter and patient via phone. Patient is feeling well. No cough. Has follow up planned with pulmonary next week. Denies shortness of breath today. 09/30/2023 Patient reports no signs shortness of breath except yesterday when he felt like he was having asthma. Used his inhaler and felt better. 10/08/2023  patient reports that he is doing well. Has had some occasional shortness of breath when working outside.  Using his inhaler as needed. Denies fever. Denies cough.  Knowledge Deficit Related to Sepsis due to community-acquired pneumonia and heart failure  09/23/2023  Weight unchanged per daughters reports.  Not following low salt diet as patient likes to snack.  Nephrology placed on 2 liter fluid restriction, and changed lasix  to as needed.  Denies chest pain or swelling today. 09/30/2023  Reports that he has decreased his sodium intake. Feels better. No signs of infections. 10/08/2023  Reports doing better with decreasing salt intake.    Goal:  Over the next 30 days, the patient will not experience hospital readmission  Interventions:  Transitions of Care: Doctor Visits  - discussed the importance of doctor visits Reviewed Signs and symptoms of infection Assessed pain Assessed weight Encouraged patient to continue to use incentive spirometer.  Encouraged daughter to check BP Reviewed progress with decreasing salt intake Reviewed weight range of 149-151 pounds    Heart Failure Interventions: Provided education on low sodium diet- reviewed in detail the importance of low salt. Especially since lasix  is now  PRN. Encouraged daughter and patient to read labels and find snack stuff that is lower in salt.  Provided education about placing scale on hard, flat surface Discussed importance of daily weight and advised patient to weigh and record daily Discussed the importance of keeping all appointments with provider Patient Self Care Activities:  Call pharmacy for medication refills 3-7 days in advance of running out of medications Call  provider office for new concerns or questions  Notify RN Care Manager of TOC call rescheduling needs Participate in Transition of Care Program/Attend TOC scheduled calls Take medications as prescribed   call office if I gain more than 2 pounds in one day or 5 pounds in one week use salt in moderation- read labels and make better choices for low salt. watch for swelling in feet, ankles and legs every day weigh myself daily Keep my rescue inhaler with me at all times.  Plan:  Telephone follow up appointment with care management team member scheduled for:  10/13/2023 at 10am  The patient has been provided with contact information for the care management team and has been advised to call with any health related questions or concerns.         Recommendation:   Continue Current Plan of Care  Follow Up Plan:   Telephone follow up appointment date/time:  10/13/2023 at 10 am   Alan Ee, RN, BSN, [employer]- Transition of Care Team.  [facility] [phone]

## 2023-10-08 NOTE — Patient Instructions (Signed)
 Visit Information  Thank you for taking time to visit with me today. Please don't hesitate to contact me if I can be of assistance to you before our next scheduled telephone appointment.  Our next appointment is by telephone on 10/13/2023 at 10 am  Following is a copy of your care plan:   Goals Addressed             This Visit's Progress    [facility] Transitions of Care (TOC) Care Plan       Problems:  Recent Hospitalization for treatment of Sepsis due to community-acquired pneumonia  09/23/2023  Spoke with daughter and patient via phone. Patient is feeling well. No cough. Has follow up planned with pulmonary next week. Denies shortness of breath today. 09/30/2023 Patient reports no signs shortness of breath except yesterday when he felt like he was having asthma. Used his inhaler and felt better. 10/08/2023  patient reports that he is doing well. Has had some occasional shortness of breath when working outside.  Using his inhaler as needed. Denies fever. Denies cough.  Knowledge Deficit Related to Sepsis due to community-acquired pneumonia and heart failure  09/23/2023  Weight unchanged per daughters reports.  Not following low salt diet as patient likes to snack.  Nephrology placed on 2 liter fluid restriction, and changed lasix  to as needed.  Denies chest pain or swelling today. 09/30/2023  Reports that he has decreased his sodium intake. Feels better. No signs of infections. 10/08/2023  Reports doing better with decreasing salt intake.    Goal:  Over the next 30 days, the patient will not experience hospital readmission  Interventions:  Transitions of Care: Doctor Visits  - discussed the importance of doctor visits Reviewed Signs and symptoms of infection Assessed pain Assessed weight Encouraged patient to continue to use incentive spirometer.  Encouraged daughter to check BP Reviewed progress with decreasing salt intake Reviewed weight range of 149-151 pounds    Heart Failure  Interventions: Provided education on low sodium diet- reviewed in detail the importance of low salt. Especially since lasix  is now PRN. Encouraged daughter and patient to read labels and find snack stuff that is lower in salt.  Provided education about placing scale on hard, flat surface Discussed importance of daily weight and advised patient to weigh and record daily Discussed the importance of keeping all appointments with provider Patient Self Care Activities:  Call pharmacy for medication refills 3-7 days in advance of running out of medications Call provider office for new concerns or questions  Notify RN Care Manager of TOC call rescheduling needs Participate in Transition of Care Program/Attend TOC scheduled calls Take medications as prescribed   call office if I gain more than 2 pounds in one day or 5 pounds in one week use salt in moderation- read labels and make better choices for low salt. watch for swelling in feet, ankles and legs every day weigh myself daily Keep my rescue inhaler with me at all times.  Plan:  Telephone follow up appointment with care management team member scheduled for:  10/13/2023 at 10am  The patient has been provided with contact information for the care management team and has been advised to call with any health related questions or concerns.         Patient verbalizes understanding of instructions and care plan provided today and agrees to view in MyChart. Active MyChart status and patient understanding of how to access instructions and care plan via MyChart confirmed with patient.  Telephone follow up appointment with care management team member scheduled for:  Please call the care guide team at [phone] if you need to cancel or reschedule your appointment.   Please call the Suicide and Crisis Lifeline: 988 call the [place]  National Suicide Prevention Lifeline: 508-324-6267 or TTY: 601-352-7599 TTY 609-100-2332) to talk to a trained  counselor call 1-800-273-TALK (toll free, 24 hour hotline) call 911 if you are experiencing a Mental Health or Behavioral Health Crisis or need someone to talk to.  Alan Ee, RN, BSN, CEN [employer]- Transition of Care Team.  [facility] [phone]

## 2023-10-13 ENCOUNTER — Other Ambulatory Visit: Payer: Self-pay

## 2023-10-13 NOTE — Transitions of Care (Post Inpatient/ED Visit) (Signed)
 Transition of Care week 5  Visit Note  10/13/2023  Name: [name] MRN: [id number]          DOB: [birth year]  Situation: Patient enrolled in [facility] 30-day program. Visit completed with patient/ daughter by telephone.   Background:   Initial Transition Care Management Follow-up Telephone Call    Past Medical History:  Diagnosis Date   Allergy     Anxiety    B12 deficiency anemia    Blood transfusion without reported diagnosis    CAD (coronary artery disease)    Cancer (HCC)    bladder, Right renal cell also   Chronic kidney disease    Colon polyps    COPD (chronic obstructive pulmonary disease) (HCC)    Depression    Diabetes mellitus (HCC)    Dyspnea    Esophagus, Barrett's    GERD (gastroesophageal reflux disease)    Headache    History of bladder cancer    Bladder cancer 8 times   History of hiatal hernia    Hyperlipidemia    Hypertension    Localized osteoarthrosis, lower leg    Myocardial infarction (HCC) 2021   Pneumonia    Restless leg syndrome    Sleep apnea    does not wear cpap   Stenosis of esophagus     Assessment: Doing well. No cough or respiratory concerns.  Denies any new concerns today. Daughter is taking great care of patient.  Patient Reported Symptoms: Cognitive Cognitive Status: Able to follow simple commands, Alert and oriented to person, place, and time, Normal speech and language skills      Neurological Neurological Review of Symptoms: No symptoms reported    HEENT HEENT Symptoms Reported: No symptoms reported      Cardiovascular Cardiovascular Symptoms Reported: No symptoms reported Does patient have uncontrolled Hypertension?: No Cardiovascular Management Strategies: Medication therapy Weight: 150 lb (68 kg) Cardiovascular Comment: denies cough, shortness of breath.  Respiratory Respiratory Symptoms Reported: Shortness of breath Other Respiratory Symptoms: Reports no shortness of breath today. Respiratory Management  Strategies: Routine screening Respiratory Self-Management Outcome: 4 (good)  Endocrine Endocrine Symptoms Reported: No symptoms reported Is patient diabetic?: No Endocrine Self-Management Outcome: 4 (good)  Gastrointestinal Gastrointestinal Symptoms Reported: Abdominal pain or discomfort Other Gastrointestinal Symptoms: continues to feel empty, growls,LBM yesterday      Genitourinary Genitourinary Symptoms Reported: No symptoms reported    Integumentary Integumentary Symptoms Reported: No symptoms reported    Musculoskeletal Musculoskelatal Symptoms Reviewed: Back pain Additional Musculoskeletal Details: chronic pain        Psychosocial Psychosocial Symptoms Reported: No symptoms reported         Today's Vitals   10/13/23 1006 10/13/23 1008  Weight: 150 lb (68 kg)   PainSc:  7      Medications Reviewed Today     Reviewed by Rumalda Alan PENNER, RN (Registered Nurse) on 10/13/23 at 1004  Med List Status: <None>   Medication Order Taking? Sig Documenting Provider Last Dose Status Informant  acetaminophen  (TYLENOL ) 500 MG tablet [id number] Yes Take 1,000 mg by mouth daily as needed for moderate pain (pain score 4-6) or mild pain (pain score 1-3). [provider]  Active Child, Pharmacy Records  albuterol  (VENTOLIN  HFA) 108 [phone] Base) MCG/ACT inhaler [id number] Yes Inhale 2 puffs into the lungs every 6 (six) hours as needed for wheezing or shortness of breath. Hunsucker, Donnice SAUNDERS, MD  Active Child, Pharmacy Records  amLODipine  (NORVASC ) 5 MG tablet [id number] Yes TAKE 1 TABLET BY MOUTH DAILY Theophilus  Delma Tully GRADE, MD  Active Child, Pharmacy Records  apixaban  (ELIQUIS ) 2.5 MG TABS tablet [id number] Yes Take 1 tablet (2.5 mg total) by mouth 2 (two) times daily. Jonel Lonni SQUIBB, MD  Active   clopidogrel  (PLAVIX ) 75 MG tablet [id number] Yes TAKE 1 TABLET BY MOUTH ONCE  DAILY Burnard Debby LABOR, MD  Active Child, Pharmacy Records           Med Note (CRUTHIS, CHLOE C   Wed Sep 08, 2023  8:11 AM) Has been holding for procedure.   Coenzyme Q10 (CO Q-10) 300 MG CAPS [id number] Yes Take 300 mg by mouth every evening. [provider]  Active Child, Pharmacy Records  cyanocobalamin  (VITAMIN B12) 1000 MCG/ML injection [id number] Yes Inject 1 mL (1,000 mcg total) into the muscle every 30 (thirty) days. Theophilus Delma, Tully GRADE, MD  Active Child, Pharmacy Records  cyclobenzaprine  (FLEXERIL ) 5 MG tablet [id number] Yes Take 1 tablet (5 mg total) by mouth at bedtime as needed for muscle spasms. Theophilus Delma, Tully GRADE, MD  Active Child, Pharmacy Records           Med Note (CRUTHIS, SHEFFIELD BROCKS   Wed Sep 08, 2023  7:14 AM)    ezetimibe  (ZETIA ) 10 MG tablet [id number] Yes TAKE 1 TABLET BY MOUTH DAILY Theophilus Delma, Tully GRADE, MD  Active Child, Pharmacy Records  ferrous sulfate 325 (65 FE) MG EC tablet [id number] Yes Take 325 mg by mouth daily. [provider]  Active Child, Pharmacy Records  fluticasone  (FLONASE ) 50 MCG/ACT nasal spray [id number] Yes Place 1 spray into both nostrils daily. [provider]  Active Child, Pharmacy Records  Fluticasone -Umeclidin-Vilant (TRELEGY ELLIPTA ) 200-62.5-25 MCG/ACT AEPB [id number] Yes Inhale 1 puff into the lungs daily. Hunsucker, Donnice SAUNDERS, MD  Active Child, Pharmacy Records  furosemide  (LASIX ) 20 MG tablet [id number] Yes Take 1 tablet (20 mg total) by mouth every other day. Jonel Lonni SQUIBB, MD  Active   isosorbide  mononitrate (IMDUR ) 30 MG 24 hr tablet [id number] Yes Take 1 tablet (30 mg total) by mouth daily. Jonel Lonni SQUIBB, MD  Active   Menthol, Topical Analgesic, [facility] EX) [id number] Yes Apply 1 application topically daily as needed (pain). [provider]  Active Child, Pharmacy Records  Multiple Vitamin (MULTIVITAMIN) tablet [id number] Yes Take 1 tablet by mouth daily. [provider]  Active Child, Pharmacy Records  nebivolol  (BYSTOLIC ) 10 MG tablet [id number] Yes Take 0.5 tablets (5 mg  total) by mouth daily. Jonel Lonni SQUIBB, MD  Active   nitroGLYCERIN  (NITROSTAT ) 0.4 MG SL tablet [id number] Yes PLACE 1 TABLET UNDER THE TONGUE EVERY 5 MINUTES AS NEEDED FOR CHEST PAIN Theophilus Delma, Tully GRADE, MD  Active   Nystatin (GERHARDT'S BUTT CREAM) CREA [id number] Yes Apply 1 Application topically daily. Danford, Lonni SQUIBB, MD  Active   oxyCODONE -acetaminophen  (PERCOCET) 10-325 MG tablet [id number] Yes Take 1 tablet by mouth 2 (two) times daily. Theophilus Delma, Tully GRADE, MD  Active   oxyCODONE -acetaminophen  (PERCOCET) 10-325 MG tablet [id number] Yes Take 1 tablet by mouth 2 (two) times daily. Theophilus Delma, Tully GRADE, MD  Active   oxyCODONE -acetaminophen  (PERCOCET) 10-325 MG tablet [id number] Yes Take 1 tablet by mouth 2 (two) times daily. Theophilus Delma, Tully GRADE, MD  Active   pantoprazole  (PROTONIX ) 40 MG tablet [id number] Yes Take 1 tablet (40 mg total) by mouth daily. Craig Alan SAUNDERS, PA-C  Active Child, Pharmacy Records  prednisoLONE acetate (PRED FORTE) 1 % ophthalmic suspension [id number] Yes Place 1 drop into both eyes 4 (  four) times daily. [provider]  Active Child, Pharmacy Records           Med Note (CRUTHIS, CHLOE C   Wed Sep 08, 2023  8:24 AM) Per daughter the pt randomly took this medication out of his medication box and decided to start taking it again on 09/07/23. This prescription is from December of 2024 per daughter. No fill hx found.   rOPINIRole  (REQUIP ) 2 MG tablet [id number] Yes TAKE 1 TABLET BY MOUTH AT  BEDTIME Theophilus Andrews, Tully GRADE, MD  Active Child, Pharmacy Records  rosuvastatin  (CRESTOR ) 10 MG tablet [id number] Yes TAKE 1 TABLET BY MOUTH DAILY Burnard Debby LABOR, MD  Active Child, Pharmacy Records  saccharomyces boulardii [facility]) 250 MG capsule [id number] Yes Take 250 mg by mouth every evening. [provider]  Active Child, Pharmacy Records  sodium zirconium cyclosilicate  (LOKELMA ) 5 g packet [id number] Yes Take 5 g by mouth  daily. [provider]  Active Child, Pharmacy Records  venlafaxine  (EFFEXOR ) 100 MG tablet [id number] Yes Take 1 tablet (100 mg total) by mouth daily. Theophilus Andrews, Tully GRADE, MD  Active Child, Pharmacy Records           Med Note (CRUTHIS, CHLOE C   Wed Sep 08, 2023  7:14 AM)              Goals Addressed             This Visit's Progress    COMPLETED: [facility] Transitions of Care (TOC) Care Plan       Problems:  Recent Hospitalization for treatment of Sepsis due to community-acquired pneumonia  09/23/2023  Spoke with daughter and patient via phone. Patient is feeling well. No cough. Has follow up planned with pulmonary next week. Denies shortness of breath today. 09/30/2023 Patient reports no signs shortness of breath except yesterday when he felt like he was having asthma. Used his inhaler and felt better. 10/08/2023  patient reports that he is doing well. Has had some occasional shortness of breath when working outside.  Using his inhaler as needed. Denies fever. Denies cough. 10/12/2023 Reports breathing has improved. Denies cough.  Knowledge Deficit Related to Sepsis due to community-acquired pneumonia and heart failure  09/23/2023  Weight unchanged per daughters reports.  Not following low salt diet as patient likes to snack.  Nephrology placed on 2 liter fluid restriction, and changed lasix  to as needed.  Denies chest pain or swelling today. 09/30/2023  Reports that he has decreased his sodium intake. Feels better. No signs of infections. 10/08/2023  Reports doing better with decreasing salt intake.   10/12/2023  Reports understanding of heart failure and his chronic conditions.   Goal:  Over the next 30 days, the patient will not experience hospital readmission  Interventions:  Transitions of Care: Doctor Visits  - discussed the importance of doctor visits Reviewed Signs and symptoms of infection Assessed pain Assessed weight Reviewed progress with decreasing salt  intake Reviewed weight range of 149-151 pounds Reviewed GI issues and reviewed medical records from past GI visits. Encouraged patient to eat when he feels empty.  Encouraged patient to take his medications on a full stomach. Reviewed things that make reflux worse like caffeine, sodas, chocolate. Reviewed importance of being upright after eating. Reviewed benefits of small meals more frequent vs several large meals.  Encouraged patient to discuss this concern with MD.  Reviewed completion of 30 day TOC program.  Reviewed and offered chronic case management and patient declined.  Heart Failure Interventions: Provided education on low sodium diet- reviewed in detail the importance of low salt. Especially since lasix  is now PRN. Encouraged daughter and patient to read labels and find snack stuff that is lower in salt.  Provided education about placing scale on hard, flat surface Discussed importance of daily weight and advised patient to weigh and record daily Discussed the importance of keeping all appointments with provider Patient Self Care Activities:  Call pharmacy for medication refills 3-7 days in advance of running out of medications Call provider office for new concerns or questions  Take medications as prescribed   call office if I gain more than 2 pounds in one day or 5 pounds in one week use salt in moderation- read labels and make better choices for low salt. watch for swelling in feet, ankles and legs every day weigh myself daily Keep my rescue inhaler with me at all times.  Plan:  Case closed. Completed program without a readmission. Declines CCM services.          Recommendation:   Continue Current Plan of Care  Follow Up Plan:   Closing From:  Transitions of Care Program  Alan Ee, RN, BSN, [employer]- Transition of Care Team.  [facility] [phone]

## 2023-10-13 NOTE — Patient Instructions (Signed)
 Visit Information  Thank you for taking time to visit with me today.   Following is a copy of your care plan:   Goals Addressed             This Visit's Progress    COMPLETED: [facility] Transitions of Care (TOC) Care Plan       Problems:  Recent Hospitalization for treatment of Sepsis due to community-acquired pneumonia  09/23/2023  Spoke with daughter and patient via phone. Patient is feeling well. No cough. Has follow up planned with pulmonary next week. Denies shortness of breath today. 09/30/2023 Patient reports no signs shortness of breath except yesterday when he felt like he was having asthma. Used his inhaler and felt better. 10/08/2023  patient reports that he is doing well. Has had some occasional shortness of breath when working outside.  Using his inhaler as needed. Denies fever. Denies cough. 10/12/2023 Reports breathing has improved. Denies cough.  Knowledge Deficit Related to Sepsis due to community-acquired pneumonia and heart failure  09/23/2023  Weight unchanged per daughters reports.  Not following low salt diet as patient likes to snack.  Nephrology placed on 2 liter fluid restriction, and changed lasix  to as needed.  Denies chest pain or swelling today. 09/30/2023  Reports that he has decreased his sodium intake. Feels better. No signs of infections. 10/08/2023  Reports doing better with decreasing salt intake.   10/12/2023  Reports understanding of heart failure and his chronic conditions.   Goal:  Over the next 30 days, the patient will not experience hospital readmission  Interventions:  Transitions of Care: Doctor Visits  - discussed the importance of doctor visits Reviewed Signs and symptoms of infection Assessed pain Assessed weight Reviewed progress with decreasing salt intake Reviewed weight range of 149-151 pounds Reviewed GI issues and reviewed medical records from past GI visits. Encouraged patient to eat when he feels empty.  Encouraged patient to take his  medications on a full stomach. Reviewed things that make reflux worse like caffeine, sodas, chocolate. Reviewed importance of being upright after eating. Reviewed benefits of small meals more frequent vs several large meals.  Encouraged patient to discuss this concern with MD.  Reviewed completion of 30 day TOC program.  Reviewed and offered chronic case management and patient declined.   Heart Failure Interventions: Provided education on low sodium diet- reviewed in detail the importance of low salt. Especially since lasix  is now PRN. Encouraged daughter and patient to read labels and find snack stuff that is lower in salt.  Provided education about placing scale on hard, flat surface Discussed importance of daily weight and advised patient to weigh and record daily Discussed the importance of keeping all appointments with provider Patient Self Care Activities:  Call pharmacy for medication refills 3-7 days in advance of running out of medications Call provider office for new concerns or questions  Take medications as prescribed   call office if I gain more than 2 pounds in one day or 5 pounds in one week use salt in moderation- read labels and make better choices for low salt. watch for swelling in feet, ankles and legs every day weigh myself daily Keep my rescue inhaler with me at all times.  Plan:  Case closed. Completed program without a readmission. Declines CCM services.         Patient verbalizes understanding of instructions and care plan provided today and agrees to view in MyChart. Active MyChart status and patient understanding of how to access instructions and  care plan via MyChart confirmed with patient.     No further follow up required: Follow up with PCP as suggested  Please call the care guide team at [phone] if you need to cancel or reschedule your appointment.   Please call the Suicide and Crisis Lifeline: 988 call the [place]  National Suicide Prevention Lifeline:  959-253-2189 or TTY: 629-385-6636 TTY 902-147-2299) to talk to a trained counselor call 1-800-273-TALK (toll free, 24 hour hotline) call 911 if you are experiencing a Mental Health or Behavioral Health Crisis or need someone to talk to.  Alan Ee, RN, BSN, CEN [employer]- Transition of Care Team.  [facility] [phone]

## 2023-10-14 ENCOUNTER — Telehealth

## 2023-10-18 ENCOUNTER — Ambulatory Visit: Admitting: Physical Therapy

## 2023-10-18 NOTE — Therapy (Incomplete)
 OUTPATIENT PHYSICAL THERAPY NEURO TREATMENT NOTE   Patient Name: [name] MRN: [id number] DOB:20-Dec-1940, 83 y.o., male Today's Date: 10/18/2023   PCP: Theophilus Andrews, Tully GRADE, MD  REFERRING PROVIDER: Jonel Lonni SQUIBB, MD  END OF SESSION:    Past Medical History:  Diagnosis Date   Allergy     Anxiety    B12 deficiency anemia    Blood transfusion without reported diagnosis    CAD (coronary artery disease)    Cancer (HCC)    bladder, Right renal cell also   Chronic kidney disease    Colon polyps    COPD (chronic obstructive pulmonary disease) (HCC)    Depression    Diabetes mellitus (HCC)    Dyspnea    Esophagus, Barrett's    GERD (gastroesophageal reflux disease)    Headache    History of bladder cancer    Bladder cancer 8 times   History of hiatal hernia    Hyperlipidemia    Hypertension    Localized osteoarthrosis, lower leg    Myocardial infarction (HCC) 2021   Pneumonia    Restless leg syndrome    Sleep apnea    does not wear cpap   Stenosis of esophagus    Past Surgical History:  Procedure Laterality Date   ABDOMINAL AORTOGRAM N/A 08/03/2023   Procedure: ABDOMINAL AORTOGRAM;  Surgeon: Serene Gaile LELON, MD;  [facility];  Service: Cardiovascular;  Laterality: N/A;   BILIARY BRUSHING  04/01/2018   Procedure: BILIARY BRUSHING;  Surgeon: Wilhelmenia Aloha Raddle., MD;  Location: [facility] ENDOSCOPY;  Service: Gastroenterology;;   BILIARY BRUSHING  09/12/2018   Procedure: BILIARY BRUSHING;  Surgeon: Wilhelmenia Aloha Raddle., MD;  Location: [facility] ENDOSCOPY;  Service: Gastroenterology;;   BILIARY BRUSHING  11/28/2018   Procedure: BILIARY BRUSHING;  Surgeon: Wilhelmenia Aloha Raddle., MD;  Location: [facility] ENDOSCOPY;  Service: Gastroenterology;;   BILIARY BRUSHING  03/11/2020   Procedure: BILIARY BRUSHING;  Surgeon: Wilhelmenia Aloha Raddle., MD;  Location: [facility] ENDOSCOPY;  Service: Gastroenterology;;   BILIARY DILATION  09/12/2018   Procedure:  BILIARY DILATION;  Surgeon: Wilhelmenia Aloha Raddle., MD;  Location: [facility] ENDOSCOPY;  Service: Gastroenterology;;   BILIARY DILATION  11/28/2018   Procedure: BILIARY DILATION;  Surgeon: Wilhelmenia Aloha Raddle., MD;  Location: [facility] ENDOSCOPY;  Service: Gastroenterology;;   BILIARY DILATION  03/11/2020   Procedure: BILIARY DILATION;  Surgeon: Wilhelmenia Aloha Raddle., MD;  Location: [facility]COPY;  Service: Gastroenterology;;   BILIARY STENT PLACEMENT  04/01/2018   Procedure: BILIARY STENT PLACEMENT;  Surgeon: Wilhelmenia Aloha Raddle., MD;  Location: [facility] ENDOSCOPY;  Service: Gastroenterology;;   BILIARY STENT PLACEMENT  09/12/2018   Procedure: BILIARY STENT PLACEMENT;  Surgeon: Wilhelmenia Aloha Raddle., MD;  Location: [facility] ENDOSCOPY;  Service: Gastroenterology;;   BILIARY STENT PLACEMENT  11/28/2018   Procedure: BILIARY STENT PLACEMENT;  Surgeon: Wilhelmenia Aloha Raddle., MD;  Location: [facility] ENDOSCOPY;  Service: Gastroenterology;;   BIOPSY  04/01/2018   Procedure: BIOPSY;  Surgeon: Wilhelmenia Aloha Raddle., MD;  Location: [facility] ENDOSCOPY;  Service: Gastroenterology;;   BIOPSY  09/12/2018   Procedure: BIOPSY;  Surgeon: Wilhelmenia Aloha Raddle., MD;  Location: [facility] ENDOSCOPY;  Service: Gastroenterology;;   BIOPSY  11/28/2018   Procedure: BIOPSY;  Surgeon: Wilhelmenia Aloha Raddle., MD;  Location: [facility] ENDOSCOPY;  Service: Gastroenterology;;   BIOPSY  03/11/2020   Procedure: BIOPSY;  Surgeon: Wilhelmenia Aloha Raddle., MD;  Location: [facility] ENDOSCOPY;  Service: Gastroenterology;;   bladder cancer      x 8 cystoscopy   CERVICAL DISCECTOMY  ACDF   CHOLECYSTECTOMY     COLONOSCOPY  11/17/2005   normal    CORONARY ARTERY BYPASS GRAFT     x4   CORONARY STENT INTERVENTION N/A 01/05/2019   Procedure: CORONARY STENT INTERVENTION;  Surgeon: Mady Bruckner, MD;  [facility];  Service: Cardiovascular;  Laterality: N/A;   CYSTOSCOPY WITH BIOPSY N/A 03/27/2022   Procedure: CYSTOSCOPY WITH BIOPSY;  Surgeon: Watt Rush, MD;  Location: [facility] ORS;  Service: Urology;  Laterality: N/A;   CYSTOSCOPY WITH RETROGRADE PYELOGRAM, URETEROSCOPY AND STENT PLACEMENT Right 03/27/2022   Procedure: CYSTOSCOPY WITH RIGHT RETROGRADE PYELOGRAM, URETEROSCOPY AND STENT PLACEMENT urethral dilation;  Surgeon: Watt Rush, MD;  Location: [facility] ORS;  Service: Urology;  Laterality: Right;   ENDOSCOPIC MUCOSAL RESECTION  09/12/2018   Procedure: ENDOSCOPIC MUCOSAL RESECTION;  Surgeon: Wilhelmenia Aloha Raddle., MD;  Location: [facility] ENDOSCOPY;  Service: Gastroenterology;;   ENDOSCOPIC RETROGRADE CHOLANGIOPANCREATOGRAPHY (ERCP) WITH PROPOFOL  N/A 04/01/2018   Procedure: ENDOSCOPIC RETROGRADE CHOLANGIOPANCREATOGRAPHY (ERCP) WITH PROPOFOL ;  Surgeon: Wilhelmenia Aloha Raddle., MD;  Location: [facility] ENDOSCOPY;  Service: Gastroenterology;  Laterality: N/A;   ENDOSCOPIC RETROGRADE CHOLANGIOPANCREATOGRAPHY (ERCP) WITH PROPOFOL  N/A 09/12/2018   Procedure: ENDOSCOPIC RETROGRADE CHOLANGIOPANCREATOGRAPHY (ERCP) WITH PROPOFOL ;  Surgeon: Wilhelmenia Aloha Raddle., MD;  Location: [facility] ENDOSCOPY;  Service: Gastroenterology;  Laterality: N/A;   ENDOSCOPIC RETROGRADE CHOLANGIOPANCREATOGRAPHY (ERCP) WITH PROPOFOL  N/A 03/11/2020   Procedure: ENDOSCOPIC RETROGRADE CHOLANGIOPANCREATOGRAPHY (ERCP) WITH PROPOFOL ;  Surgeon: Wilhelmenia Aloha Raddle., MD;  [facility] ENDOSCOPY;  Service: Gastroenterology;  Laterality: N/A;   ENDOSCOPIC RETROGRADE CHOLANGIOPANCREATOGRAPHY (ERCP) WITH PROPOFOL  N/A 06/03/2020   Procedure: ENDOSCOPIC RETROGRADE CHOLANGIOPANCREATOGRAPHY (ERCP) WITH PROPOFOL ;  Surgeon: Wilhelmenia Aloha Raddle., MD;  Location[facility] ENDOSCOPY;  Service: Gastroenterology;  Laterality: N/A;   ERCP N/A 11/28/2018   Procedure: ENDOSCOPIC RETROGRADE CHOLANGIOPANCREATOGRAPHY (ERCP) +EGD with spyglass;  Surgeon: Wilhelmenia Aloha Raddle., MD;  Location: [facility] ENDOSCOPY;  Service: Gastroenterology;  Laterality: N/A;   ESOPHAGOGASTRODUODENOSCOPY  04/29/2010   ESOPHAGOGASTRODUODENOSCOPY (EGD)  WITH PROPOFOL  N/A 04/01/2018   Procedure: ESOPHAGOGASTRODUODENOSCOPY (EGD) WITH PROPOFOL ;  Surgeon: Wilhelmenia Aloha Raddle., MD;  Location: [facility] ENDOSCOPY;  Service: Gastroenterology;  Laterality: N/A;   ESOPHAGOGASTRODUODENOSCOPY (EGD) WITH PROPOFOL  N/A 09/12/2018   Procedure: ESOPHAGOGASTRODUODENOSCOPY (EGD) WITH PROPOFOL ;  Surgeon: Wilhelmenia Aloha Raddle., MD;  Location: [facility] ENDOSCOPY;  Service: Gastroenterology;  Laterality: N/A;   ESOPHAGOGASTRODUODENOSCOPY (EGD) WITH PROPOFOL  N/A 11/28/2018   Procedure: ESOPHAGOGASTRODUODENOSCOPY (EGD) WITH PROPOFOL ;  Surgeon: Wilhelmenia Aloha Raddle., MD;  Location: [facility] ENDOSCOPY;  Service: Gastroenterology;  Laterality: N/A;   ESOPHAGOGASTRODUODENOSCOPY (EGD) WITH PROPOFOL  N/A 03/11/2020   Procedure: ESOPHAGOGASTRODUODENOSCOPY (EGD) WITH PROPOFOL ;  Surgeon: Wilhelmenia Aloha Raddle., MD;  [facility]OSCOPY;  Service: Gastroenterology;  Laterality: N/A;   ESOPHAGOGASTRODUODENOSCOPY (EGD) WITH PROPOFOL  N/A 06/03/2020   Procedure: ESOPHAGOGASTRODUODENOSCOPY (EGD) WITH PROPOFOL ;  Surgeon: Wilhelmenia Aloha Raddle., MD;  Location[facility] ENDOSCOPY;  Service: Gastroenterology;  Laterality: N/A;   EUS  04/01/2018   Procedure: FULL UPPER ENDOSCOPIC ULTRASOUND (EUS) RADIAL;  Surgeon: Wilhelmenia Aloha Raddle., MD;  Location: [facility] ENDOSCOPY;  Service: Gastroenterology;;   EUS N/A 09/12/2018   Procedure: UPPER ENDOSCOPIC ULTRASOUND (EUS) RADIAL;  Surgeon: Wilhelmenia Aloha Raddle., MD;  Location: [facility] ENDOSCOPY;  Service: Gastroenterology;  Laterality: N/A;   FINE NEEDLE ASPIRATION  09/12/2018   Procedure: FINE NEEDLE ASPIRATION (FNA) LINEAR;  Surgeon: Wilhelmenia Aloha Raddle., MD;  Location: [facility] ENDOSCOPY;  Service: Gastroenterology;;   HAND SURGERY Left 2018   saw accident   HEMOSTASIS CLIP PLACEMENT  09/12/2018   Procedure: HEMOSTASIS CLIP PLACEMENT;  Surgeon: Wilhelmenia Aloha Raddle., MD;  Location: [facility]  ENDOSCOPY;  Service: Gastroenterology;;   HEMOSTASIS CLIP PLACEMENT  06/03/2020    Procedure: HEMOSTASIS CLIP PLACEMENT;  Surgeon: Wilhelmenia Aloha Raddle., MD;  Location: [facility] ENDOSCOPY;  Service: Gastroenterology;;   I & D EXTREMITY Left 11/24/2016   Procedure: IRRIGATION AND DEBRIDEMENT LEFT HAND, THUMB, INDEX, MIDDLE, RING, AND SMALL FINGERS WITH RECONSTRUCTION;  Surgeon: Camella Fallow, MD;  Location: [facility] OR;  Service: Orthopedics;  Laterality: Left;   KNEE ARTHROSCOPY Left    LEFT HEART CATH AND CORS/GRAFTS ANGIOGRAPHY N/A 01/05/2019   Procedure: LEFT HEART CATH AND CORS/GRAFTS ANGIOGRAPHY;  Surgeon: Mady Bruckner, MD;  [facility]: [facility];  Service: Cardiovascular;  Laterality: N/A;   LUMBAR LAMINECTOMY     and fusion x 2   NASAL SINUS SURGERY     POPLITEAL SYNOVIAL CYST EXCISION     REMOVAL OF STONES  04/01/2018   Procedure: REMOVAL OF STONES;  Surgeon: Wilhelmenia Aloha Raddle., MD;  Location: [facility] ENDOSCOPY;  Service: Gastroenterology;;   REMOVAL OF STONES  09/12/2018   Procedure: REMOVAL OF STONES;  Surgeon: Wilhelmenia Aloha Raddle., MD;  Location: [facility] ENDOSCOPY;  Service: Gastroenterology;;   REMOVAL OF STONES  11/28/2018   Procedure: REMOVAL OF STONES;  Surgeon: Wilhelmenia Aloha Raddle., MD;  Location: [facility] ENDOSCOPY;  Service: Gastroenterology;;   REMOVAL OF STONES  03/11/2020   Procedure: REMOVAL OF STONES;  Surgeon: Wilhelmenia Aloha Raddle., MD;  Location: [facility]COPY;  Service: Gastroenterology;;   REMOVAL OF STONES  06/03/2020   Procedure: REMOVAL OF STONES;  Surgeon: Wilhelmenia Aloha Raddle., MD;  Location: [facility] ENDOSCOPY;  Service: Gastroenterology;;   ROBOT ASSITED LAPAROSCOPIC NEPHROURETERECTOMY Right 06/03/2022   Procedure: XI ROBOT ASSITED LAPAROSCOPIC NEPHROURETERECTOMY, FLEXIBLE CYSTOSCOPY;  Surgeon: Alvaro Ricardo KATHEE Raddle., MD;  Location: [facility] ORS;  Service: Urology;  Laterality: Right;  3 HRS   SAVORY DILATION N/A 09/12/2018   Procedure: SAVORY DILATION;  Surgeon: Wilhelmenia Aloha Raddle., MD;  Location: [facility] ENDOSCOPY;  Service: Gastroenterology;   Laterality: N/A;   SAVORY DILATION N/A 11/28/2018   Procedure: SAVORY DILATION;  Surgeon: Wilhelmenia Aloha Raddle., MD;  Location: [facility] ENDOSCOPY;  Service: Gastroenterology;  Laterality: N/A;   SEPTOPLASTY Bilateral 05/26/2021   Procedure: SEPTOPLASTY;  Surgeon: Karis Clunes, MD;  Location: [facility];  Service: ENT;  Laterality: Bilateral;   SPHINCTEROTOMY  04/01/2018   Procedure: ANNETT;  Surgeon: Mansouraty, Aloha Raddle., MD;  Location: [facility] ENDOSCOPY;  Service: Gastroenterology;;   LAHOMA CHOLANGIOSCOPY N/A 11/28/2018   Procedure: DEBHOJDD CHOLANGIOSCOPY;  Surgeon: Wilhelmenia Aloha Raddle., MD;  Location: [facility] ENDOSCOPY;  Service: Gastroenterology;  Laterality: N/A;   SPYGLASS CHOLANGIOSCOPY N/A 06/03/2020   Procedure: SPYGLASS CHOLANGIOSCOPY;  Surgeon: Wilhelmenia Aloha Raddle., MD;  Location[facility] ENDOSCOPY;  Service: Gastroenterology;  Laterality: N/A;   STENT REMOVAL  09/12/2018   Procedure: STENT REMOVAL;  Surgeon: Wilhelmenia Aloha Raddle., MD;  Location: [facility] ENDOSCOPY;  Service: Gastroenterology;;   CLEDA REMOVAL  11/28/2018   Procedure: STENT REMOVAL;  Surgeon: Wilhelmenia Aloha Raddle., MD;  Location: [facility] ENDOSCOPY;  Service: Gastroenterology;;   CLEDA REMOVAL  03/11/2020   Procedure: STENT REMOVAL;  Surgeon: Wilhelmenia Aloha Raddle., MD;  Location: [facility];  Service: Gastroenterology;;   SUBMUCOSAL LIFTING INJECTION  09/12/2018   Procedure: SUBMUCOSAL LIFTING INJECTION;  Surgeon: Wilhelmenia Aloha Raddle., MD;  Location: [facility] ENDOSCOPY;  Service: Gastroenterology;;   Patient Active Problem List   Diagnosis Date Noted   Typical atrial flutter (HCC) 09/13/2023   CHF exacerbation (HCC) 09/08/2023   Elevated brain natriuretic peptide (BNP) level 09/08/2023   CKD (chronic  kidney disease), stage IV (HCC) 09/08/2023   History of CAD (coronary artery disease) 09/08/2023   History of anxiety 09/08/2023   History of renal cell cancer s/p  nephrectomy 09/08/2023   Chronic pain  syndrome 09/08/2023   C. difficile diarrhea 07/10/2023   AKI (acute kidney injury) 07/07/2023   Cancer of renal pelvis, right (HCC) 06/03/2022   Bloating 04/27/2020   Gas pain 04/27/2020   Abdominal cramping 04/27/2020   Functional abdominal pain syndrome 11/12/2019   Lower extremity edema 11/12/2019   Hyperlipidemia    History of cholecystectomy 01/19/2019   Chronic diarrhea 01/19/2019   Antiplatelet or antithrombotic long-term use 01/19/2019   Non-ST elevation (NSTEMI) myocardial infarction [facility])    Acute hypoxemic respiratory failure (HCC) 01/04/2019   Multifocal pneumonia 01/04/2019   Sepsis due to pneumonia (HCC) 01/04/2019   Elevated troponin 01/04/2019   Acute on chronic diastolic (congestive) heart failure (HCC) 01/04/2019   Acute respiratory failure (HCC) 01/04/2019   elevated IgG4 11/01/2018   Choledocholithiasis 10/27/2018   Dilation of biliary tract 10/27/2018   Esophageal dysphagia 10/27/2018   Upper airway cough syndrome 07/28/2018   Pleural effusion on left 07/28/2018   History of pancreatitis 05/12/2018   Abnormal LFTs 05/12/2018   Abnormal findings on esophagogastroduodenoscopy (EGD) 05/12/2018   History of biliary stent insertion 05/12/2018   Anemia 05/12/2018   Biliary stricture    ESBL (extended spectrum beta-lactamase) producing bacteria infection 04/04/2018   Ascending cholangitis 04/01/2018   Diabetes mellitus type 2, noninsulin dependent (HCC) 03/31/2018   Pancreatitis 03/31/2018   Sepsis (HCC) 03/31/2018   Bacteremia due to Gram-negative bacteria 03/31/2018   Abdominal pain    Dilated bile duct    Acute biliary pancreatitis 03/30/2018   Leukocytosis 03/30/2018   CAD (coronary artery disease) 03/30/2018   Osteoarthritis of left knee 10/18/2017   Cervical radiculopathy 10/04/2017   Pain of left hand 07/19/2017   Pre-operative cardiovascular examination 06/16/2017   CKD (chronic kidney disease), stage III 06/16/2017   Carpal tunnel syndrome  05/27/2017   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 05/10/2017   Open fracture of base of middle phalanx of finger 02/17/2017   Laceration of index finger 02/17/2017   Laceration of nail bed of finger 02/17/2017   Laceration of thumb 02/17/2017   Open fracture of distal phalanx of finger 02/17/2017   Open fractures of multiple sites of phalanx of left hand 11/24/2016   B12 deficiency 05/28/2015   GERD (gastroesophageal reflux disease) 11/16/2014   Hyperlipidemia LDL goal <70 11/21/2013   DOE (dyspnea on exertion) 11/21/2013   Other dysphagia 07/14/2013   History of esophageal stricture 07/14/2013   Hx of CABG 06/05/2013   Leg pain, bilateral 10/18/2012   Restless leg syndrome 06/21/2012   ULNAR NEUROPATHY, LEFT 08/05/2009   Urinary obstruction 10/19/2008   ACTINIC KERATOSIS 06/25/2008   DRY EYE SYNDROME 10/13/2007   CARCINOMA, BLADDER, HX OF 07/02/2007   BARRETTS ESOPHAGUS 05/23/2007   Hypothyroidism 04/08/2007   Other malaise and fatigue 04/08/2007   Essential hypertension 10/12/2006   ANEMIA, B12 DEFICIENCY 09/08/2006   Depression 07/29/2006   NEUROPATHY, IDIOPATHIC PERIPHERAL NEC 07/29/2006   Allergic rhinitis 07/29/2006   LOW BACK PAIN 07/29/2006   COLONIC POLYPS 11/04/2000    ONSET DATE: 8/19/250   REFERRING DIAG: J18.9 (ICD-10-CM) - Community acquired pneumonia of left lower lobe of lung  THERAPY DIAG:  No diagnosis found.  Rationale for Evaluation and Treatment: Habilitation  SUBJECTIVE:  SUBJECTIVE STATEMENT: ***I'm much better since I was in the hospital. Able to do some things around the house like sowing and watering grass, riding the lawnmower. Reports that he has not left his house much for the past 3 years d/t chronic pain from neck all the way down to my feet as well  as SOB.    Pt accompanied by: self  PERTINENT HISTORY: Anxiety, CAD, bladder CA, CKD, COPD, depression, DM, hiatal hernia, HLD, HTN, MI 2021, cervical discectomy, CABG x4, lumbar laminectomy and fusion  PAIN:  Are you having pain? Pt reports chronic generalized pain- currently 7-8/10 with meds    PRECAUTIONS: Fall  RED FLAGS: None   WEIGHT BEARING RESTRICTIONS: No  FALLS: Has patient fallen in last 6 months? Yes. Number of falls 2-3  LIVING ENVIRONMENT: Lives with: lives alone Lives in: House/apartment Stairs: 2 steps to enter; 3 story home; bedroom on main floor Has following equipment at home: Single point cane, Environmental consultant - 4 wheeled, Crutches, Tour manager, and Grab bars  PLOF: Independent with basic ADLs and has a cleaning service and daughter assists with laundry and med management   PATIENT GOALS: I'm not really sure you can help me  OBJECTIVE:   TODAY'S TREATMENT: 10/18/2023 Activity Comments                      ---------------------------------- Note: Objective measures were completed at Evaluation unless otherwise noted.  DIAGNOSTIC FINDINGS: none recent    Vitals: 92% spO2, 71 bpm  COGNITION: Overall cognitive status: Within functional limits for tasks assessed   SENSATION: Pt reports numbness in B feet   POSTURE: rounded shoulders, forward head, and increased thoracic kyphosis  LOWER EXTREMITY ROM:     Active  Right Eval Left Eval  Hip flexion    Hip extension    Hip abduction    Hip adduction    Hip internal rotation    Hip external rotation    Knee flexion    Knee extension    Ankle dorsiflexion 16 7  Ankle plantarflexion    Ankle inversion    Ankle eversion     (Blank rows = not tested)  LOWER EXTREMITY MMT:    MMT (in sitting) Right Eval Left Eval  Hip flexion 4+ 4  Hip extension    Hip abduction 4+ 4+  Hip adduction 4 4  Hip internal rotation    Hip external rotation    Knee flexion 5 5  Knee extension 5 5  Ankle  dorsiflexion 4+ 4  Ankle plantarflexion 4+ 3+  Ankle inversion    Ankle eversion    (Blank rows = not tested)   GAIT: Findings: Assistive device utilized:None, Level of assistance: SBA, and Comments: trunk flexed and limited arm swing, decreased R>L foot clearance, slowed speed  After ambulating 1108ft: 88% spO2, 88bpm   FUNCTIONAL TESTS:  5 times sit to stand: 32.18 sec pushing off knees and not standing fully  10 meter walk test: 17.43 sec (1.89 ft/sec) Vitals: 93% spO2, 83bpm  TREATMENT DATE: 10/03/24    PATIENT EDUCATION: Education details: prognosis, POC, edu on benefits of PT to address pt's exam deficits as they relate to functional impairments, HEP Person educated: Patient Education method: Explanation, Demonstration, Tactile cues, Verbal cues, and Handouts Education comprehension: verbalized understanding and returned demonstration  HOME EXERCISE PROGRAM: Access Code: [email or web address] URL: [email or web address] Date: 10/04/2023 Prepared by: [facility] - [facility]  Program Notes Monitor your oxygen levels- generally want at least 90%  Exercises - Sit to Stand with Hands on Knees  - 1 x daily - 5 x weekly - 2 sets - 5 reps - Standing March with Unilateral Counter Support  - 1 x daily - 5 x weekly - 2 sets - 20 reps - Side Stepping with Counter Support  - 1 x daily - 5 x weekly - 2-3 sets - 1 min hold    GOALS: Goals reviewed with patient? Yes  SHORT TERM GOALS: Target date: 10/25/2023  Patient to be independent with initial HEP. Baseline: HEP initiated Goal status: INITIAL    LONG TERM GOALS: Target date: 11/22/23  Patient to be independent with advanced HEP. Baseline: Not yet initiated  Goal status: INITIAL  Patient to demonstrate B LE strength >/=4+/5.  Baseline: See above Goal status:  INITIAL  Patient to score at least 20/24 on DGI in order to decrease risk of falls.  Baseline: NT Goal status: INITIAL  Patient to demonstrate 5xSTS test in <20 sec in order to decrease risk of falls.  Baseline: 32.18 sec pushing off knees and not standing fully Goal status: INITIAL  Patient to ambulate at least 250 ft with SpO2 at least 90% on room air and without excessive SOB. Baseline: After ambulating 122ft: 88% spO2, 88bpm  Goal status: INITIAL  Patient to demonstrate gait speed of at least 2.3 ft/sec in order to improve access to community. Baseline: 1.89 ft/sec Goal status: INITIAL   ASSESSMENT:  CLINICAL IMPRESSION: Pt presents today ***. Skilled PT session focused on ***. Pt needs ***. Pt will continue to benefit from skilled PT towards goals for improved functional mobility and decreased fall risk.   Patient is an 83 y/o M presenting to OPPT with c/o limited activity levels s/p hospitalization 09/07/23-09/14/23 for pneumonia and CHF flare. Patient today presenting with Flexed posture, mild LE weakness, gait deviations, decreased gait speed and transfer speed, and poor endurance. Patient was educated on gentle endurance HEP and reported understanding.  Would benefit from skilled PT services 1 x/week for 6 weeks to address aforementioned impairments in order to optimize level of function.    OBJECTIVE IMPAIRMENTS: Abnormal gait, decreased activity tolerance, decreased balance, decreased endurance, decreased mobility, difficulty walking, decreased strength, postural dysfunction, and pain.   ACTIVITY LIMITATIONS: carrying, lifting, bending, sitting, standing, squatting, sleeping, stairs, transfers, bed mobility, bathing, toileting, dressing, reach over head, hygiene/grooming, and locomotion level  PARTICIPATION LIMITATIONS: meal prep, cleaning, laundry, shopping, community activity, yard work, and church  PERSONAL FACTORS: Age, Financial risk analyst, Past/current experiences, Time since onset  of injury/illness/exacerbation, Transportation, and 3+ comorbidities: Anxiety, CAD, bladder CA, CKD, COPD, depression, DM, hiatal hernia, HLD, HTN, MI 2021, cervical discectomy, CABG x4, lumbar laminectomy and fusion are also affecting patient's functional outcome.   REHAB POTENTIAL: Good  CLINICAL DECISION MAKING: Evolving/moderate complexity  EVALUATION COMPLEXITY: Moderate  PLAN:  PT FREQUENCY: 1x/week  PT DURATION: 6 weeks  PLANNED INTERVENTIONS: 97164- PT Re-evaluation, 97750- Physical Performance Testing, 97110-Therapeutic exercises, 97530- Therapeutic activity, [id number]- Neuromuscular re-education, 97535- Self Care, 02859-  Manual therapy, [id number]- Gait training, 04007- Canalith repositioning, 02886- Aquatic Therapy, [phone]- Electrical stimulation (manual), [phone] (1-2 muscles), 20561 (3+ muscles)- Dry Needling, Patient/Family education, Balance training, Stair training, Taping, Joint mobilization, Spinal mobilization, Vestibular training, Cryotherapy, and Moist heat  PLAN FOR NEXT SESSION: ***DGI; progress HEP for endurance, balance, transfers    Greig Anon, PT 10/18/23 7:51 AM Phone: [phone] Fax: [phone]  [facility] at [facility] [address], [address] [place], [place] [address] Phone # [phone] Fax # [phone]

## 2023-10-18 NOTE — Progress Notes (Unsigned)
 Cardiology Office Note:    Date:  10/19/2023   ID:  Tejas, Seawood 12/12/1940, MRN [id number]  PCP:  Theophilus Andrews, Tully GRADE, MD   [facility] Providers Cardiologist:  Jerel Balding, MD     Referring MD: Theophilus Andrews, Jonna*   Chief complaint: Hospital follow up   History of Present Illness:    [name] is a 83 y.o. male with a hx of CABG X4 in 2001 (LIMA-LAD, SVG-OM, SVG-RPDA/PLA), GERD w/ esophageal dilitations, mild emphysema, OSA, sepsis secondary to colitis/pancreatitis/cholecystitis s/p choleystectomy, right nephroureterectomy (2024) for renal cell carcinoma, biliary stricture s/p ERCP, presenting to office for hospital follow up.   2004 Cardiac cath showed patent grafts. Echo 2012 showed EF 40-45% w/ moderate septal hypokinesis, G1DD. NM stress in 2016 showed EF 54%, normal perfusion.   10/28/2016 echo: EF 60-65%, G2DD, aortic sclerosis present without stenosis, mild LA dilation, trivial PR.   2019 had change in symptoms, Echo and myoview  ordered. New mild aortic valve stenosis observed on echo. Lexiscan  myoview  was low risk, no ischemia, EF 51%.  Following robotic cholecystectomy by Dr. Cameron on 01/02/2019 he developed a bilateral pneumonia and SOB w/ increasing troponin. Subsequent cardiac cath on 01/05/2019 revealed 99% subtotally occluded SVG-RPDA/RPL, treated w/ 3 overlapping resolute onyx DES. ASA and plavix  recommended to be continued indefinitely following procedure. If refractory symptoms continued, it was noted that there would be consideration for PCI of ostial LM, LCx, and OM1. Echo obtained same day showed EF 40-45%, severe hypokinesis of entire inferior and inferoseptal wall, G1DD, mild MR, could not exclude ASD/PFO.  Seen by Dr. Serene on 07/26/2023 for a mesenteric artery US  that showed possible stenosis, scheduled for angiogram on 7/14 that showed 30-40% ostial superior mesenteric artery stenosis, no intervention necessary at that  time.   Admitted to the hospital from 09/07/2023-09/14/2023 for pneumonia, new atrial flutter, CHF exacerbation. Arrived to ED w/ worsening SOB w/ fever x 2 days(103.2 in ED). WBCs 15.2, hgb 10.6, proBNP 13,045, HS troponin 47, CXR showed findings consistent w/ mild CHF/fluid overload, patchy opacity in LLL possibly atelectasis/pneumonia/alveolar edema. Echocardiogram 09/08/2023 showed a normal normal LVEF of 50-55%, normal RV function, moderate LAE, trivial MR. Cardiology consulted for management of fluid status and atrial flutter. He was diuresed, but sCr increased to 3.11 from a baseline of 2.6, complicated by single kidney w/ baseline CKD IV. EKG showed atrial flutter 4:1, 77 bpm, was started on 2.5 mg Eliquis  BID, d/c ASA, continuing plavix . Needed DCCV post TEE, but could not perform TEE 2/2 hx of esophageal strictures, to be considered outpatient following 1 month of anticoagulation.  Patient presents to the office today with his granddaughter, appears stable from a cardiovascular standpoint.  Denies chest pain, states even when he had his prior heart attacks he did not have chest pain, his previous anginal equivalent was SOB/DOE, fatigue.  Patient states his SOB/DOE is better than it has been over the last 3 years since leaving the hospital.  Does complain of orthopnea that occurs after a few hours of sleeping, tried a CPAP in the remote past and was unable to tolerate it.  Reports some unsteadiness initially when standing, denies dizziness/lightheadedness/near syncope.  Reports chronic nausea that has been an ongoing issue for years secondary to previous GI problems.  Denies dark/tarry/bloody stools, hematuria.  Checks his weight daily at home, states that it has maintained between 148-150 lbs on his scale.  Denies weight gain of >3lb overnight/5 lbs in a week. Denies  LE edema.  Does report bilateral lower extremity muscular pain exacerbated with walking ongoing for a while.  Past Medical History:   Diagnosis Date   Allergy     Anxiety    B12 deficiency anemia    Blood transfusion without reported diagnosis    CAD (coronary artery disease)    Cancer (HCC)    bladder, Right renal cell also   Chronic kidney disease    Colon polyps    COPD (chronic obstructive pulmonary disease) (HCC)    Depression    Diabetes mellitus (HCC)    Dyspnea    Esophagus, Barrett's    GERD (gastroesophageal reflux disease)    Headache    History of bladder cancer    Bladder cancer 8 times   History of hiatal hernia    Hyperlipidemia    Hypertension    Localized osteoarthrosis, lower leg    Myocardial infarction (HCC) 2021   Pneumonia    Restless leg syndrome    Sleep apnea    does not wear cpap   Stenosis of esophagus     Past Surgical History:  Procedure Laterality Date   ABDOMINAL AORTOGRAM N/A 08/03/2023   Procedure: ABDOMINAL AORTOGRAM;  Surgeon: Serene Gaile ORN, MD;  [facility]: [facility];  Service: Cardiovascular;  Laterality: N/A;   BILIARY BRUSHING  04/01/2018   Procedure: BILIARY BRUSHING;  Surgeon: Wilhelmenia Aloha Raddle., MD;  Location: [facility] ENDOSCOPY;  Service: Gastroenterology;;   BILIARY BRUSHING  09/12/2018   Procedure: BILIARY BRUSHING;  Surgeon: Wilhelmenia Aloha Raddle., MD;  Location: [facility] ENDOSCOPY;  Service: Gastroenterology;;   BILIARY BRUSHING  11/28/2018   Procedure: BILIARY BRUSHING;  Surgeon: Wilhelmenia Aloha Raddle., MD;  Location: [facility] ENDOSCOPY;  Service: Gastroenterology;;   BILIARY BRUSHING  03/11/2020   Procedure: BILIARY BRUSHING;  Surgeon: Wilhelmenia Aloha Raddle., MD;  Location: [facility] ENDOSCOPY;  Service: Gastroenterology;;   BILIARY DILATION  09/12/2018   Procedure: BILIARY DILATION;  Surgeon: Wilhelmenia Aloha Raddle., MD;  Location: [facility] ENDOSCOPY;  Service: Gastroenterology;;   BILIARY DILATION  11/28/2018   Procedure: BILIARY DILATION;  Surgeon: Wilhelmenia Aloha Raddle., MD;  Location: [facility] ENDOSCOPY;  Service: Gastroenterology;;   BILIARY DILATION  03/11/2020    Procedure: BILIARY DILATION;  Surgeon: Wilhelmenia Aloha Raddle., MD;  Location: [facility] ENDOSCOPY;  Service: Gastroenterology;;   BILIARY STENT PLACEMENT  04/01/2018   Procedure: BILIARY STENT PLACEMENT;  Surgeon: Wilhelmenia Aloha Raddle., MD;  Location: [facility] ENDOSCOPY;  Service: Gastroenterology;;   BILIARY STENT PLACEMENT  09/12/2018   Procedure: BILIARY STENT PLACEMENT;  Surgeon: Wilhelmenia Aloha Raddle., MD;  Location: [facility] ENDOSCOPY;  Service: Gastroenterology;;   BILIARY STENT PLACEMENT  11/28/2018   Procedure: BILIARY STENT PLACEMENT;  Surgeon: Wilhelmenia Aloha Raddle., MD;  Location: [facility] ENDOSCOPY;  Service: Gastroenterology;;   BIOPSY  04/01/2018   Procedure: BIOPSY;  Surgeon: Wilhelmenia Aloha Raddle., MD;  Location: [facility] ENDOSCOPY;  Service: Gastroenterology;;   BIOPSY  09/12/2018   Procedure: BIOPSY;  Surgeon: Wilhelmenia Aloha Raddle., MD;  Location: [facility] ENDOSCOPY;  Service: Gastroenterology;;   BIOPSY  11/28/2018   Procedure: BIOPSY;  Surgeon: Wilhelmenia Aloha Raddle., MD;  Location: [facility] ENDOSCOPY;  Service: Gastroenterology;;   BIOPSY  03/11/2020   Procedure: BIOPSY;  Surgeon: Wilhelmenia Aloha Raddle., MD;  Location: [facility] ENDOSCOPY;  Service: Gastroenterology;;   bladder cancer      x 8 cystoscopy   CERVICAL DISCECTOMY     ACDF   CHOLECYSTECTOMY     COLONOSCOPY  11/17/2005   normal    CORONARY ARTERY BYPASS GRAFT  x4   CORONARY STENT INTERVENTION N/A 01/05/2019   Procedure: CORONARY STENT INTERVENTION;  Surgeon: Mady Bruckner, MD;  Location: [facility];  Service: Cardiovascular;  Laterality: N/A;   CYSTOSCOPY WITH BIOPSY N/A 03/27/2022   Procedure: CYSTOSCOPY WITH BIOPSY;  Surgeon: Watt Rush, MD;  Location: [facility] ORS;  Service: Urology;  Laterality: N/A;   CYSTOSCOPY WITH RETROGRADE PYELOGRAM, URETEROSCOPY AND STENT PLACEMENT Right 03/27/2022   Procedure: CYSTOSCOPY WITH RIGHT RETROGRADE PYELOGRAM, URETEROSCOPY AND STENT PLACEMENT urethral dilation;  Surgeon: Watt Rush, MD;  Location:  [facility] ORS;  Service: Urology;  Laterality: Right;   ENDOSCOPIC MUCOSAL RESECTION  09/12/2018   Procedure: ENDOSCOPIC MUCOSAL RESECTION;  Surgeon: Wilhelmenia Aloha Raddle., MD;  Location: [facility] ENDOSCOPY;  Service: Gastroenterology;;   ENDOSCOPIC RETROGRADE CHOLANGIOPANCREATOGRAPHY (ERCP) WITH PROPOFOL  N/A 04/01/2018   Procedure: ENDOSCOPIC RETROGRADE CHOLANGIOPANCREATOGRAPHY (ERCP) WITH PROPOFOL ;  Surgeon: Wilhelmenia Aloha Raddle., MD;  Location: [facility] ENDOSCOPY;  Service: Gastroenterology;  Laterality: N/A;   ENDOSCOPIC RETROGRADE CHOLANGIOPANCREATOGRAPHY (ERCP) WITH PROPOFOL  N/A 09/12/2018   Procedure: ENDOSCOPIC RETROGRADE CHOLANGIOPANCREATOGRAPHY (ERCP) WITH PROPOFOL ;  Surgeon: Wilhelmenia Aloha Raddle., MD;  Location: [facility] ENDOSCOPY;  Service: Gastroenterology;  Laterality: N/A;   ENDOSCOPIC RETROGRADE CHOLANGIOPANCREATOGRAPHY (ERCP) WITH PROPOFOL  N/A 03/11/2020   Procedure: ENDOSCOPIC RETROGRADE CHOLANGIOPANCREATOGRAPHY (ERCP) WITH PROPOFOL ;  Surgeon: Wilhelmenia Aloha Raddle., MD;  Location: [facility] ENDOSCOPY;  Service: Gastroenterology;  Laterality: N/A;   ENDOSCOPIC RETROGRADE CHOLANGIOPANCREATOGRAPHY (ERCP) WITH PROPOFOL  N/A 06/03/2020   Procedure: ENDOSCOPIC RETROGRADE CHOLANGIOPANCREATOGRAPHY (ERCP) WITH PROPOFOL ;  Surgeon: Wilhelmenia Aloha Raddle., MD;  Location: [facility] ENDOSCOPY;  Service: Gastroenterology;  Laterality: N/A;   ERCP N/A 11/28/2018   Procedure: ENDOSCOPIC RETROGRADE CHOLANGIOPANCREATOGRAPHY (ERCP) +EGD with spyglass;  Surgeon: Wilhelmenia Aloha Raddle., MD;  Location: [facility] ENDOSCOPY;  Service: Gastroenterology;  Laterality: N/A;   ESOPHAGOGASTRODUODENOSCOPY  04/29/2010   ESOPHAGOGASTRODUODENOSCOPY (EGD) WITH PROPOFOL  N/A 04/01/2018   Procedure: ESOPHAGOGASTRODUODENOSCOPY (EGD) WITH PROPOFOL ;  Surgeon: Wilhelmenia Aloha Raddle., MD;  Location: [facility] ENDOSCOPY;  Service: Gastroenterology;  Laterality: N/A;   ESOPHAGOGASTRODUODENOSCOPY (EGD) WITH PROPOFOL  N/A 09/12/2018   Procedure: ESOPHAGOGASTRODUODENOSCOPY  (EGD) WITH PROPOFOL ;  Surgeon: Wilhelmenia Aloha Raddle., MD;  Location: [facility] ENDOSCOPY;  Service: Gastroenterology;  Laterality: N/A;   ESOPHAGOGASTRODUODENOSCOPY (EGD) WITH PROPOFOL  N/A 11/28/2018   Procedure: ESOPHAGOGASTRODUODENOSCOPY (EGD) WITH PROPOFOL ;  Surgeon: Wilhelmenia Aloha Raddle., MD;  Location: [facility] ENDOSCOPY;  Service: Gastroenterology;  Laterality: N/A;   ESOPHAGOGASTRODUODENOSCOPY (EGD) WITH PROPOFOL  N/A 03/11/2020   Procedure: ESOPHAGOGASTRODUODENOSCOPY (EGD) WITH PROPOFOL ;  Surgeon: Wilhelmenia Aloha Raddle., MD;  Location: [facility] ENDOSCOPY;  Service: Gastroenterology;  Laterality: N/A;   ESOPHAGOGASTRODUODENOSCOPY (EGD) WITH PROPOFOL  N/A 06/03/2020   Procedure: ESOPHAGOGASTRODUODENOSCOPY (EGD) WITH PROPOFOL ;  Surgeon: Wilhelmenia Aloha Raddle., MD;  Location: [facility] ENDOSCOPY;  Service: Gastroenterology;  Laterality: N/A;   EUS  04/01/2018   Procedure: FULL UPPER ENDOSCOPIC ULTRASOUND (EUS) RADIAL;  Surgeon: Wilhelmenia Aloha Raddle., MD;  Location: [facility] ENDOSCOPY;  Service: Gastroenterology;;   EUS N/A 09/12/2018   Procedure: UPPER ENDOSCOPIC ULTRASOUND (EUS) RADIAL;  Surgeon: Wilhelmenia Aloha Raddle., MD;  Location: [facility] ENDOSCOPY;  Service: Gastroenterology;  Laterality: N/A;   FINE NEEDLE ASPIRATION  09/12/2018   Procedure: FINE NEEDLE ASPIRATION (FNA) LINEAR;  Surgeon: Wilhelmenia Aloha Raddle., MD;  Location: [facility] ENDOSCOPY;  Service: Gastroenterology;;   HAND SURGERY Left 2018   saw accident   HEMOSTASIS CLIP PLACEMENT  09/12/2018   Procedure: HEMOSTASIS CLIP PLACEMENT;  Surgeon: Wilhelmenia Aloha Raddle., MD;  Location: [facility] ENDOSCOPY;  Service: Gastroenterology;;   HEMOSTASIS CLIP PLACEMENT  06/03/2020   Procedure: HEMOSTASIS CLIP PLACEMENT;  Surgeon: Wilhelmenia Aloha Raddle., MD;  Location:  [facility] ENDOSCOPY;  Service: Gastroenterology;;   I & D EXTREMITY Left 11/24/2016   Procedure: IRRIGATION AND DEBRIDEMENT LEFT HAND, THUMB, INDEX, MIDDLE, RING, AND SMALL FINGERS WITH RECONSTRUCTION;  Surgeon: Camella Fallow, MD;  Location: [facility];  Service: Orthopedics;  Laterality: Left;   KNEE ARTHROSCOPY Left    LEFT HEART CATH AND CORS/GRAFTS ANGIOGRAPHY N/A 01/05/2019   Procedure: LEFT HEART CATH AND CORS/GRAFTS ANGIOGRAPHY;  Surgeon: Mady Bruckner, MD;  Location: [facility];  Service: Cardiovascular;  Laterality: N/A;   LUMBAR LAMINECTOMY     and fusion x 2   NASAL SINUS SURGERY     POPLITEAL SYNOVIAL CYST EXCISION     REMOVAL OF STONES  04/01/2018   Procedure: REMOVAL OF STONES;  Surgeon: Wilhelmenia Aloha Raddle., MD;  Location: [facility] ENDOSCOPY;  Service: Gastroenterology;;   REMOVAL OF STONES  09/12/2018   Procedure: REMOVAL OF STONES;  Surgeon: Wilhelmenia Aloha Raddle., MD;  Location: [facility] ENDOSCOPY;  Service: Gastroenterology;;   REMOVAL OF STONES  11/28/2018   Procedure: REMOVAL OF STONES;  Surgeon: Wilhelmenia Aloha Raddle., MD;  Location: [facility] ENDOSCOPY;  Service: Gastroenterology;;   REMOVAL OF STONES  03/11/2020   Procedure: REMOVAL OF STONES;  Surgeon: Wilhelmenia Aloha Raddle., MD;  Location: [facility] ENDOSCOPY;  Service: Gastroenterology;;   REMOVAL OF STONES  06/03/2020   Procedure: REMOVAL OF STONES;  Surgeon: Wilhelmenia Aloha Raddle., MD;  Location: [facility] ENDOSCOPY;  Service: Gastroenterology;;   ROBOT ASSITED LAPAROSCOPIC NEPHROURETERECTOMY Right 06/03/2022   Procedure: XI ROBOT ASSITED LAPAROSCOPIC NEPHROURETERECTOMY, FLEXIBLE CYSTOSCOPY;  Surgeon: Alvaro Ricardo KATHEE Raddle., MD;  Location: [facility];  Service: Urology;  Laterality: Right;  3 HRS   SAVORY DILATION N/A 09/12/2018   Procedure: SAVORY DILATION;  Surgeon: Wilhelmenia Aloha Raddle., MD;  Location: [facility] ENDOSCOPY;  Service: Gastroenterology;  Laterality: N/A;   SAVORY DILATION N/A 11/28/2018   Procedure: SAVORY DILATION;  Surgeon: Wilhelmenia Aloha Raddle., MD;  Location: [facility] ENDOSCOPY;  Service: Gastroenterology;  Laterality: N/A;   SEPTOPLASTY Bilateral 05/26/2021   Procedure: SEPTOPLASTY;  Surgeon: Karis Clunes, MD;  Location: [facility];  Service: ENT;  Laterality: Bilateral;   SPHINCTEROTOMY  04/01/2018   Procedure: ANNETT;  Surgeon: Mansouraty, Aloha Raddle., MD;  Location: [facility] ENDOSCOPY;  Service: Gastroenterology;;   LAHOMA CHOLANGIOSCOPY N/A 11/28/2018   Procedure: DEBHOJDD CHOLANGIOSCOPY;  Surgeon: Wilhelmenia Aloha Raddle., MD;  Location: [facility] ENDOSCOPY;  Service: Gastroenterology;  Laterality: N/A;   SPYGLASS CHOLANGIOSCOPY N/A 06/03/2020   Procedure: SPYGLASS CHOLANGIOSCOPY;  Surgeon: Wilhelmenia Aloha Raddle., MD;  Location: [facility] ENDOSCOPY;  Service: Gastroenterology;  Laterality: N/A;   STENT REMOVAL  09/12/2018   Procedure: STENT REMOVAL;  Surgeon: Wilhelmenia Aloha Raddle., MD;  Location: [facility] ENDOSCOPY;  Service: Gastroenterology;;   CLEDA REMOVAL  11/28/2018   Procedure: STENT REMOVAL;  Surgeon: Wilhelmenia Aloha Raddle., MD;  Location: [facility] ENDOSCOPY;  Service: Gastroenterology;;   CLEDA REMOVAL  03/11/2020   Procedure: STENT REMOVAL;  Surgeon: Wilhelmenia Aloha Raddle., MD;  Location: [facility];  Service: Gastroenterology;;   SUBMUCOSAL LIFTING INJECTION  09/12/2018   Procedure: SUBMUCOSAL LIFTING INJECTION;  Surgeon: Wilhelmenia Aloha Raddle., MD;  Location: [facility] ENDOSCOPY;  Service: Gastroenterology;;    Current Medications: Current Meds  Medication Sig   acetaminophen  (TYLENOL ) 500 MG tablet Take 1,000 mg by mouth daily as needed for moderate pain (pain score 4-6) or mild pain (pain score 1-3).   albuterol  (VENTOLIN  HFA) 108 (90 Base) MCG/ACT inhaler Inhale 2 puffs into the lungs every 6 (six) hours as needed for wheezing or  shortness of breath.   [Paused] amLODipine  (NORVASC ) 5 MG tablet TAKE 1 TABLET BY MOUTH DAILY   apixaban  (ELIQUIS ) 2.5 MG TABS tablet Take 1 tablet (2.5 mg total) by mouth 2 (two) times daily.   clopidogrel  (PLAVIX ) 75 MG tablet TAKE 1 TABLET BY MOUTH ONCE  DAILY   Coenzyme Q10 (CO Q-10) 300 MG CAPS Take 300 mg by mouth every evening.   cyanocobalamin  (VITAMIN B12) 1000 MCG/ML injection  Inject 1 mL (1,000 mcg total) into the muscle every 30 (thirty) days.   cyclobenzaprine  (FLEXERIL ) 5 MG tablet Take 1 tablet (5 mg total) by mouth at bedtime as needed for muscle spasms.   ezetimibe  (ZETIA ) 10 MG tablet TAKE 1 TABLET BY MOUTH DAILY   ferrous sulfate 325 (65 FE) MG EC tablet Take 325 mg by mouth daily.   fluticasone  (FLONASE ) 50 MCG/ACT nasal spray Place 1 spray into both nostrils daily.   Fluticasone -Umeclidin-Vilant (TRELEGY ELLIPTA ) 200-62.5-25 MCG/ACT AEPB Inhale 1 puff into the lungs daily.   furosemide  (LASIX ) 20 MG tablet Take 1 tablet (20 mg total) by mouth every other day. (Patient taking differently: Take 20 mg by mouth as needed for edema or fluid.)   isosorbide  mononitrate (IMDUR ) 30 MG 24 hr tablet Take 1 tablet (30 mg total) by mouth daily.   Menthol, Topical Analgesic, (BENGAY EX) Apply 1 application topically daily as needed (pain).   Multiple Vitamin (MULTIVITAMIN) tablet Take 1 tablet by mouth daily.   nebivolol  (BYSTOLIC ) 10 MG tablet Take 0.5 tablets (5 mg total) by mouth daily.   nitroGLYCERIN  (NITROSTAT ) 0.4 MG SL tablet PLACE 1 TABLET UNDER THE TONGUE EVERY 5 MINUTES AS NEEDED FOR CHEST PAIN   Nystatin (GERHARDT'S BUTT CREAM) CREA Apply 1 Application topically daily.   oxyCODONE -acetaminophen  (PERCOCET) 10-325 MG tablet Take 1 tablet by mouth 2 (two) times daily.   pantoprazole  (PROTONIX ) 40 MG tablet Take 1 tablet (40 mg total) by mouth daily.   rOPINIRole  (REQUIP ) 2 MG tablet TAKE 1 TABLET BY MOUTH AT  BEDTIME   rosuvastatin  (CRESTOR ) 10 MG tablet TAKE 1 TABLET BY MOUTH DAILY   saccharomyces boulardii (FLORASTOR) 250 MG capsule Take 250 mg by mouth every evening.   sodium zirconium cyclosilicate  (LOKELMA ) 5 g packet Take 5 g by mouth daily.   venlafaxine  (EFFEXOR ) 100 MG tablet Take 1 tablet (100 mg total) by mouth daily.   [DISCONTINUED] oxyCODONE -acetaminophen  (PERCOCET) 10-325 MG tablet Take 1 tablet by mouth 2 (two) times daily.     Allergies:    Patient has no known allergies.   Social History   Socioeconomic History   Marital status: Widowed    Spouse name: Not on file   Number of children: 3   Years of education: Not on file   Highest education level: 12th grade  Occupational History   Occupation: Education officer, environmental ( part-time)  Tobacco Use   Smoking status: Former    Current packs/day: 0.00    Average packs/day: 0.5 packs/day for 5.0 years (2.5 ttl pk-yrs)    Types: Cigarettes    Start date: 03/31/1971    Quit date: 03/30/1976    Years since quitting: 47.5   Smokeless tobacco: Never  Vaping Use   Vaping status: Never Used  Substance and Sexual Activity   Alcohol use: No    Alcohol/week: 0.0 standard drinks of alcohol   Drug use: No   Sexual activity: Yes  Other Topics Concern   Not on file  Social History Narrative   Not on file  Social Drivers of Corporate investment banker Strain: Low Risk  (09/09/2023)   Overall Financial Resource Strain (CARDIA)    Difficulty of Paying Living Expenses: Not very hard  Food Insecurity: No Food Insecurity (09/15/2023)   Hunger Vital Sign    Worried About Running Out of Food in the Last Year: Never true    Ran Out of Food in the Last Year: Never true  Transportation Needs: No Transportation Needs (09/15/2023)   PRAPARE - Administrator, Civil Service (Medical): No    Lack of Transportation (Non-Medical): No  Physical Activity: Unknown (02/10/2023)   Exercise Vital Sign    Days of Exercise per Week: 0 days    Minutes of Exercise per Session: Not on file  Recent Concern: Physical Activity - Inactive (02/10/2023)   Exercise Vital Sign    Days of Exercise per Week: 0 days    Minutes of Exercise per Session: 30 min  Stress: No Stress Concern Present (02/10/2023)   [employer] of Occupational Health - Occupational Stress Questionnaire    Feeling of Stress : Not at all  Social Connections: Moderately Integrated (07/08/2023)   Social Connection and Isolation Panel     Frequency of Communication with Friends and Family: More than three times a week    Frequency of Social Gatherings with Friends and Family: Twice a week    Attends Religious Services: More than 4 times per year    Active Member of [employer] or Organizations: Yes    Attends Banker Meetings: More than 4 times per year    Marital Status: Widowed     Family History: The patient's family history includes Coronary artery disease in an other family member; Hypertension in his father; Melanoma in his mother; Stroke in his father. There is no history of Colon cancer, Esophageal cancer, Stomach cancer, Rectal cancer, Pancreatic cancer, Liver disease, or Inflammatory bowel disease.  ROS:   Please see the history of present illness.     All other systems reviewed and are negative.  EKGs/Labs/Other Studies Reviewed:    The following studies were reviewed today:  EKG Interpretation Date/Time:  Tuesday October 19 2023 09:05:43 EDT Ventricular Rate:  61 PR Interval:  190 QRS Duration:  98 QT Interval:  448 QTC Calculation: 450 R Axis:   31  Text Interpretation: Normal sinus rhythm Incomplete right bundle branch block Nonspecific T wave abnormality When compared with ECG of 17-Sep-2023 11:08, No significant change was found Confirmed by Purl Claytor [phone]) on 10/19/2023 9:09:43 AM    Recent Labs: 06/25/2023: TSH 3.15 07/10/2023: Magnesium  2.3 09/07/2023: Pro Brain Natriuretic Peptide 13,045.0 09/09/2023: ALT 14 09/16/2023: BUN 66; Creatinine, Ser 3.36; Hemoglobin 11.0; Platelets 315.0; Potassium 5.3 No hemolysis seen; Sodium 143  Recent Lipid Panel    Component Value Date/Time   CHOL 121 06/02/2022 1000   CHOL 118 12/09/2020 0853   CHOL 116 03/30/2013 1053   TRIG 123.0 06/02/2022 1000   TRIG 80 03/30/2013 1053   HDL 35.60 (L) 06/02/2022 1000   HDL 36 (L) 12/09/2020 0853   HDL 34 (L) 03/30/2013 1053   CHOLHDL 3 06/02/2022 1000   VLDL 24.6 06/02/2022 1000   LDLCALC 61  06/02/2022 1000   LDLCALC 62 12/09/2020 0853   LDLCALC 66 03/30/2013 1053   LDLDIRECT 90.3 04/17/2008 0911     Risk Assessment/Calculations:    CHA2DS2-VASc Score = 6   This indicates a 9.7% annual risk of stroke. The patient's score is based upon: CHF  History: 1 HTN History: 1 Diabetes History: 1 Stroke History: 0 Vascular Disease History: 1 Age Score: 2 Gender Score: 0      Physical Exam:    VS:  BP 108/60   Pulse 63   Ht 5' 8 (1.727 m)   Wt 153 lb 12.8 oz (69.8 kg)   SpO2 94%   BMI 23.39 kg/m     Wt Readings from Last 3 Encounters:  10/19/23 153 lb 12.8 oz (69.8 kg)  10/13/23 150 lb (68 kg)  10/08/23 149 lb (67.6 kg)     GEN:  Well nourished, well developed in no acute distress HEENT: Normal NECK: No JVD; No carotid bruits CARDIAC: RRR, no murmurs, rubs, gallops RESPIRATORY:  Clear to auscultation without rales, wheezing or rhonchi  MUSCULOSKELETAL:  No edema; No deformity, LE warm, dry, pulses difficult to palpate bilaterally  SKIN: Warm and dry NEUROLOGIC:  Alert and oriented x 3 PSYCHIATRIC:  Normal affect   ASSESSMENT:    1. Heart failure with improved ejection fraction (HFimpEF) (HCC)   2. Atrial flutter, unspecified type (HCC)   3. Coronary artery disease involving native coronary artery of native heart, unspecified whether angina present   4. S/P CABG x 4   5. H/O right nephrectomy   6. CKD (chronic kidney disease), stage IV (HCC)   7. Anemia of chronic disease   8. Hyperlipidemia, unspecified hyperlipidemia type   9. Pain in both lower extremities    PLAN:    In order of problems listed above:  HFimpEF Echo 09/08/2023 during hospitalization showed LVEF 50-55% low normal LV function, no RWMA. Baseline SOB improved following hospital stay. Asymptomatic at rest.  Reports orthopnea occurring after sleeping x 3-4 hours, relieved on sitting up. CPAP use attempted in past, unable to tolerate mask, will follow up with pulmonology. Appears  euvolemic, weight unchanged from home scales, no LE edema Discussed importance of daily weights, monitoring for >3 lbs overnight or >5 lbs in a week Patient states nephrology is monitoring kidney function/potassium once every 3 weeks States nephrology started Lokelma  and changed Lasix  to as needed for fluid volume overload. Will differ lasix  dosing and potassium management to nephrology Continue Lasix  20 mg PRN fluid retention GDMT limited in the setting of single kidney/CKD IV, low normal BP Continue nebivolol  5 mg daily Not a candidate for spironolactone given hx of hyperkalemia Not a candidate for ACE/ARB/ARNI/SGLT2 given kidney function proBNP in hospital 09/07/2023, will recheck today to verify improving trend following changing lasix  to PRN  Atrial Flutter/Fib New onset 09/08/2023 in the setting of pneumonia, acute HF exacerbation.  EKG: NSR 61 bpm Continue eliquis  2.5 mg BID  Continue nebivolol  5 mg daily  CAD s/p CABG x 4 Denies CP, worsening SOB/DOE, palpitations, leg swelling Has not used any nitroglycerin  SL tabs Continue eliquis  2.5 mg BID Continue plavix  75 mg daily Continue imdur  30 mg daily Continue nebivolol  5 mg daily Continue nitroglycerin  0.4 mg SL tab PRN chest pain every 5 minutes Continue crestor  10 mg daily  Lower extremity pain Bilateral lower extremity muscular pain present from hips to feet Feet warm and dry, bilateral DP/PT pulses difficult to palpate Will order ABIs  Right nephrectomy w/ CKD IV Managed by nephrology BMP 10/04/2023: BUN 30, creatinine 2.33, GFR 27, potassium 4.1 Continue Lasix  and Lokelma  as prescribed by nephrology  Anemia of chronic disease Denies dark/tarry/bloody stools, abnormal bleeding/bruising  Continue ferrous sulfate EC 325 mg  HLD Order fasting lipid panel  Continue zetia  10 mg daily  Continue crestor  10 mg daily  Follow up in 3 months  Medication Adjustments/Labs and Tests Ordered: Current medicines are reviewed at  length with the patient today.  Concerns regarding medicines are outlined above.  Orders Placed This Encounter  Procedures   Lipid panel   Pro b natriuretic peptide (BNP)   EKG 12-Lead   VAS US  ABI WITH/WO TBI   No orders of the defined types were placed in this encounter.   Patient Instructions  Medication Instructions:  Your physician recommends that you continue on your current medications as directed. Please refer to the Current Medication list given to you today.  *If you need a refill on your cardiac medications before your next appointment, please call your pharmacy*  Lab Work: Fasting Lipid panel and Pro BNP today  Testing/Procedures: Your physician has requested that you have an ankle brachial index (ABI). During this test an ultrasound and blood pressure cuff are used to evaluate the arteries that supply the arms and legs with blood. Allow thirty minutes for this exam. There are no restrictions or special instructions.  Please note: We ask at that you not bring children with you during ultrasound (echo/ vascular) testing. Due to room size and safety concerns, children are not allowed in the ultrasound rooms during exams. Our front office staff cannot provide observation of children in our lobby area while testing is being conducted. An adult accompanying a patient to their appointment will only be allowed in the ultrasound room at the discretion of the ultrasound technician under special circumstances. We apologize for any inconvenience.   Follow-Up: At [facility], you and your health needs are our priority.  As part of our continuing mission to provide you with exceptional heart care, our providers are all part of one team.  This team includes your primary Cardiologist (physician) and Advanced Practice Providers or APPs (Physician Assistants and Nurse Practitioners) who all work together to provide you with the care you need, when you need it.  Your next appointment:    3 month(s)  Provider:   Dr. Francyne    We recommend signing up for the patient portal called MyChart.  Sign up information is provided on this After Visit Summary.  MyChart is used to connect with patients for Virtual Visits (Telemedicine).  Patients are able to view lab/test results, encounter notes, upcoming appointments, etc.  Non-urgent messages can be sent to your provider as well.   To learn more about what you can do with MyChart, go to [email or web address].   Other Instructions [employer] recommended for early detection of AFIB           Signed, Atwood Adcock E Nicolai Labonte, NP  10/19/2023 9:58 AM    [facility]

## 2023-10-19 ENCOUNTER — Encounter: Payer: Self-pay | Admitting: Nurse Practitioner

## 2023-10-19 ENCOUNTER — Ambulatory Visit: Attending: Nurse Practitioner | Admitting: Emergency Medicine

## 2023-10-19 ENCOUNTER — Other Ambulatory Visit: Payer: Self-pay | Admitting: Internal Medicine

## 2023-10-19 VITALS — BP 108/60 | HR 63 | Ht 68.0 in | Wt 153.8 lb

## 2023-10-19 DIAGNOSIS — N184 Chronic kidney disease, stage 4 (severe): Secondary | ICD-10-CM | POA: Diagnosis not present

## 2023-10-19 DIAGNOSIS — E785 Hyperlipidemia, unspecified: Secondary | ICD-10-CM | POA: Diagnosis not present

## 2023-10-19 DIAGNOSIS — Z951 Presence of aortocoronary bypass graft: Secondary | ICD-10-CM

## 2023-10-19 DIAGNOSIS — I4892 Unspecified atrial flutter: Secondary | ICD-10-CM

## 2023-10-19 DIAGNOSIS — Z905 Acquired absence of kidney: Secondary | ICD-10-CM | POA: Diagnosis not present

## 2023-10-19 DIAGNOSIS — M79605 Pain in left leg: Secondary | ICD-10-CM

## 2023-10-19 DIAGNOSIS — M79604 Pain in right leg: Secondary | ICD-10-CM

## 2023-10-19 DIAGNOSIS — I2510 Atherosclerotic heart disease of native coronary artery without angina pectoris: Secondary | ICD-10-CM | POA: Diagnosis not present

## 2023-10-19 DIAGNOSIS — D638 Anemia in other chronic diseases classified elsewhere: Secondary | ICD-10-CM | POA: Diagnosis not present

## 2023-10-19 DIAGNOSIS — I5032 Chronic diastolic (congestive) heart failure: Secondary | ICD-10-CM

## 2023-10-19 NOTE — Patient Instructions (Signed)
 Medication Instructions:  Your physician recommends that you continue on your current medications as directed. Please refer to the Current Medication list given to you today.  *If you need a refill on your cardiac medications before your next appointment, please call your pharmacy*  Lab Work: Fasting Lipid panel and Pro BNP today  Testing/Procedures: Your physician has requested that you have an ankle brachial index (ABI). During this test an ultrasound and blood pressure cuff are used to evaluate the arteries that supply the arms and legs with blood. Allow thirty minutes for this exam. There are no restrictions or special instructions.  Please note: We ask at that you not bring children with you during ultrasound (echo/ vascular) testing. Due to room size and safety concerns, children are not allowed in the ultrasound rooms during exams. Our front office staff cannot provide observation of children in our lobby area while testing is being conducted. An adult accompanying a patient to their appointment will only be allowed in the ultrasound room at the discretion of the ultrasound technician under special circumstances. We apologize for any inconvenience.   Follow-Up: At [facility], you and your health needs are our priority.  As part of our continuing mission to provide you with exceptional heart care, our providers are all part of one team.  This team includes your primary Cardiologist (physician) and Advanced Practice Providers or APPs (Physician Assistants and Nurse Practitioners) who all work together to provide you with the care you need, when you need it.  Your next appointment:   3 month(s)  Provider:   Dr. Francyne    We recommend signing up for the patient portal called MyChart.  Sign up information is provided on this After Visit Summary.  MyChart is used to connect with patients for Virtual Visits (Telemedicine).  Patients are able to view lab/test results, encounter  notes, upcoming appointments, etc.  Non-urgent messages can be sent to your provider as well.   To learn more about what you can do with MyChart, go to [email or web address].   Other Instructions [employer] recommended for early detection of AFIB

## 2023-10-20 ENCOUNTER — Ambulatory Visit: Payer: Self-pay | Admitting: Emergency Medicine

## 2023-10-20 ENCOUNTER — Other Ambulatory Visit: Payer: Self-pay

## 2023-10-20 ENCOUNTER — Ambulatory Visit: Admitting: Internal Medicine

## 2023-10-20 ENCOUNTER — Encounter: Payer: Self-pay | Admitting: Internal Medicine

## 2023-10-20 VITALS — BP 120/64 | HR 64 | Temp 98.3°F | Wt 153.9 lb

## 2023-10-20 DIAGNOSIS — N184 Chronic kidney disease, stage 4 (severe): Secondary | ICD-10-CM

## 2023-10-20 DIAGNOSIS — E1122 Type 2 diabetes mellitus with diabetic chronic kidney disease: Secondary | ICD-10-CM | POA: Diagnosis not present

## 2023-10-20 DIAGNOSIS — M79604 Pain in right leg: Secondary | ICD-10-CM

## 2023-10-20 DIAGNOSIS — I10 Essential (primary) hypertension: Secondary | ICD-10-CM

## 2023-10-20 DIAGNOSIS — E038 Other specified hypothyroidism: Secondary | ICD-10-CM | POA: Diagnosis not present

## 2023-10-20 DIAGNOSIS — G894 Chronic pain syndrome: Secondary | ICD-10-CM | POA: Diagnosis not present

## 2023-10-20 DIAGNOSIS — Z951 Presence of aortocoronary bypass graft: Secondary | ICD-10-CM

## 2023-10-20 DIAGNOSIS — Z23 Encounter for immunization: Secondary | ICD-10-CM | POA: Diagnosis not present

## 2023-10-20 DIAGNOSIS — E119 Type 2 diabetes mellitus without complications: Secondary | ICD-10-CM

## 2023-10-20 DIAGNOSIS — I2510 Atherosclerotic heart disease of native coronary artery without angina pectoris: Secondary | ICD-10-CM

## 2023-10-20 DIAGNOSIS — R0609 Other forms of dyspnea: Secondary | ICD-10-CM

## 2023-10-20 LAB — POCT GLYCOSYLATED HEMOGLOBIN (HGB A1C): Hemoglobin A1C: 5.2 % (ref 4.0–5.6)

## 2023-10-20 LAB — LIPID PANEL
Chol/HDL Ratio: 2.8 ratio (ref 0.0–5.0)
Cholesterol, Total: 97 mg/dL — ABNORMAL LOW (ref 100–199)
HDL: 35 mg/dL — ABNORMAL LOW (ref >39)
LDL Chol Calc (NIH): 44 mg/dL (ref 0–99)
Triglycerides: 90 mg/dL (ref 0–149)
VLDL Cholesterol Cal: 18 mg/dL (ref 5–40)

## 2023-10-20 LAB — PRO B NATRIURETIC PEPTIDE: NT-Pro BNP: 7410 pg/mL — ABNORMAL HIGH (ref 0–486)

## 2023-10-20 MED ORDER — OXYCODONE-ACETAMINOPHEN 10-325 MG PO TABS: 1.0000 | ORAL_TABLET | Freq: Two times a day (BID) | ORAL | 0 refills | Status: AC

## 2023-10-20 MED ORDER — ROSUVASTATIN CALCIUM 10 MG PO TABS: 10.0000 mg | ORAL_TABLET | Freq: Every day | ORAL | 0 refills | Status: DC

## 2023-10-20 MED ORDER — NEBIVOLOL HCL 10 MG PO TABS: 5.0000 mg | ORAL_TABLET | Freq: Every day | ORAL | 0 refills | Status: DC

## 2023-10-20 MED ORDER — OXYCODONE-ACETAMINOPHEN 10-325 MG PO TABS: 1.0000 | ORAL_TABLET | Freq: Two times a day (BID) | ORAL | 0 refills | Status: DC

## 2023-10-20 NOTE — Assessment & Plan Note (Signed)
 Well-controlled on current.

## 2023-10-20 NOTE — Assessment & Plan Note (Signed)
 Most recent creatinine 3.360 with a GFR of 16.  Followed by nephrology.  Is getting labs every 3 weeks at this point due to hyperkalemia.

## 2023-10-20 NOTE — Addendum Note (Signed)
 Addended by: KATHRYNE MILLMAN B on: 10/20/2023 05:03 PM   Modules accepted: Orders

## 2023-10-20 NOTE — Assessment & Plan Note (Signed)
 Well-controlled with an A1c of 5.2.

## 2023-10-20 NOTE — Assessment & Plan Note (Signed)
 On levothyroxine, last TSH was normal at 3.150.

## 2023-10-20 NOTE — Progress Notes (Signed)
 Established Patient Office Visit     CC/Reason for Visit: Follow-up chronic conditions  HPI: [name] is a 83 y.o. male who is coming in today for the above mentioned reasons. Past Medical History is significant for: Hypertension, hyperlipidemia, type 2 diabetes, heart failure with impaired ejection fraction, chronic kidney disease stage IV, chronic pain syndrome among other issues.  He follows routinely at this point with cardiology and nephrology.  Has no acute concerns, has chronic shortness of breath and fatigue.  This has not changed.   Past Medical/Surgical History: Past Medical History:  Diagnosis Date   Allergy     Anxiety    B12 deficiency anemia    Blood transfusion without reported diagnosis    CAD (coronary artery disease)    Cancer (HCC)    bladder, Right renal cell also   Chronic kidney disease    Colon polyps    COPD (chronic obstructive pulmonary disease) (HCC)    Depression    Diabetes mellitus (HCC)    Dyspnea    Esophagus, Barrett's    GERD (gastroesophageal reflux disease)    Headache    History of bladder cancer    Bladder cancer 8 times   History of hiatal hernia    Hyperlipidemia    Hypertension    Localized osteoarthrosis, lower leg    Myocardial infarction (HCC) 2021   Pneumonia    Restless leg syndrome    Sleep apnea    does not wear cpap   Stenosis of esophagus     Past Surgical History:  Procedure Laterality Date   ABDOMINAL AORTOGRAM N/A 08/03/2023   Procedure: ABDOMINAL AORTOGRAM;  Surgeon: Serene Gaile LELON, MD;  [facility];  Service: Cardiovascular;  Laterality: N/A;   BILIARY BRUSHING  04/01/2018   Procedure: BILIARY BRUSHING;  Surgeon: Wilhelmenia Aloha Raddle., MD;  Location: [facility] ENDOSCOPY;  Service: Gastroenterology;;   BILIARY BRUSHING  09/12/2018   Procedure: BILIARY BRUSHING;  Surgeon: Wilhelmenia Aloha Raddle., MD;  Location: [facility] ENDOSCOPY;  Service: Gastroenterology;;   BILIARY BRUSHING  11/28/2018    Procedure: BILIARY BRUSHING;  Surgeon: Wilhelmenia Aloha Raddle., MD;  Location: [facility] ENDOSCOPY;  Service: Gastroenterology;;   BILIARY BRUSHING  03/11/2020   Procedure: BILIARY BRUSHING;  Surgeon: Wilhelmenia Aloha Raddle., MD;  Location: [facility];  Service: Gastroenterology;;   BILIARY DILATION  09/12/2018   Procedure: BILIARY DILATION;  Surgeon: Wilhelmenia Aloha Raddle., MD;  Location: [facility] ENDOSCOPY;  Service: Gastroenterology;;   BILIARY DILATION  11/28/2018   Procedure: BILIARY DILATION;  Surgeon: Wilhelmenia Aloha Raddle., MD;  Location: [facility] ENDOSCOPY;  Service: Gastroenterology;;   BILIARY DILATION  03/11/2020   Procedure: BILIARY DILATION;  Surgeon: Wilhelmenia Aloha Raddle., MD;  Location: [facility];  Service: Gastroenterology;;   BILIARY STENT PLACEMENT  04/01/2018   Procedure: BILIARY STENT PLACEMENT;  Surgeon: Wilhelmenia Aloha Raddle., MD;  Location: [facility] ENDOSCOPY;  Service: Gastroenterology;;   BILIARY STENT PLACEMENT  09/12/2018   Procedure: BILIARY STENT PLACEMENT;  Surgeon: Wilhelmenia Aloha Raddle., MD;  Location: [facility] ENDOSCOPY;  Service: Gastroenterology;;   BILIARY STENT PLACEMENT  11/28/2018   Procedure: BILIARY STENT PLACEMENT;  Surgeon: Wilhelmenia Aloha Raddle., MD;  Location: [facility] ENDOSCOPY;  Service: Gastroenterology;;   BIOPSY  04/01/2018   Procedure: BIOPSY;  Surgeon: Wilhelmenia Aloha Raddle., MD;  Location: [facility] ENDOSCOPY;  Service: Gastroenterology;;   BIOPSY  09/12/2018   Procedure: BIOPSY;  Surgeon: Wilhelmenia Aloha Raddle., MD;  Location: [facility] ENDOSCOPY;  Service: Gastroenterology;;   BIOPSY  11/28/2018  Procedure: BIOPSY;  Surgeon: Wilhelmenia Aloha Raddle., MD;  Location: [facility] ENDOSCOPY;  Service: Gastroenterology;;   BIOPSY  03/11/2020   Procedure: BIOPSY;  Surgeon: Wilhelmenia Aloha Raddle., MD;  Location: [facility];  Service: Gastroenterology;;   bladder cancer      x 8 cystoscopy   CERVICAL DISCECTOMY     ACDF   CHOLECYSTECTOMY     COLONOSCOPY  11/17/2005   normal     CORONARY ARTERY BYPASS GRAFT     x4   CORONARY STENT INTERVENTION N/A 01/05/2019   Procedure: CORONARY STENT INTERVENTION;  Surgeon: Mady Bruckner, MD;  [facility];  Service: Cardiovascular;  Laterality: N/A;   CYSTOSCOPY WITH BIOPSY N/A 03/27/2022   Procedure: CYSTOSCOPY WITH BIOPSY;  Surgeon: Watt Rush, MD;  Location: [facility] ORS;  Service: Urology;  Laterality: N/A;   CYSTOSCOPY WITH RETROGRADE PYELOGRAM, URETEROSCOPY AND STENT PLACEMENT Right 03/27/2022   Procedure: CYSTOSCOPY WITH RIGHT RETROGRADE PYELOGRAM, URETEROSCOPY AND STENT PLACEMENT urethral dilation;  Surgeon: Watt Rush, MD;  Location: [facility];  Service: Urology;  Laterality: Right;   ENDOSCOPIC MUCOSAL RESECTION  09/12/2018   Procedure: ENDOSCOPIC MUCOSAL RESECTION;  Surgeon: Wilhelmenia Aloha Raddle., MD;  Location: [facility]COPY;  Service: Gastroenterology;;   ENDOSCOPIC RETROGRADE CHOLANGIOPANCREATOGRAPHY (ERCP) WITH PROPOFOL  N/A 04/01/2018   Procedure: ENDOSCOPIC RETROGRADE CHOLANGIOPANCREATOGRAPHY (ERCP) WITH PROPOFOL ;  Surgeon: Wilhelmenia Aloha Raddle., MD;  Location: [facility] ENDOSCOPY;  Service: Gastroenterology;  Laterality: N/A;   ENDOSCOPIC RETROGRADE CHOLANGIOPANCREATOGRAPHY (ERCP) WITH PROPOFOL  N/A 09/12/2018   Procedure: ENDOSCOPIC RETROGRADE CHOLANGIOPANCREATOGRAPHY (ERCP) WITH PROPOFOL ;  Surgeon: Wilhelmenia Aloha Raddle., MD;  Location: [facility] ENDOSCOPY;  Service: Gastroenterology;  Laterality: N/A;   ENDOSCOPIC RETROGRADE CHOLANGIOPANCREATOGRAPHY (ERCP) WITH PROPOFOL  N/A 03/11/2020   Procedure: ENDOSCOPIC RETROGRADE CHOLANGIOPANCREATOGRAPHY (ERCP) WITH PROPOFOL ;  Surgeon: Wilhelmenia Aloha Raddle., MD;  Location[facility]OSCOPY;  Service: Gastroenterology;  Laterality: N/A;   ENDOSCOPIC RETROGRADE CHOLANGIOPANCREATOGRAPHY (ERCP) WITH PROPOFOL  N/A 06/03/2020   Procedure: ENDOSCOPIC RETROGRADE CHOLANGIOPANCREATOGRAPHY (ERCP) WITH PROPOFOL ;  Surgeon: Wilhelmenia Aloha Raddle., MD;  Location[facility]OS[facility];  Service:  Gastroenterology;  Laterality: N/A;   ERCP N/A 11/28/2018   Procedure: ENDOSCOPIC RETROGRADE CHOLANGIOPANCREATOGRAPHY (ERCP) +EGD with spyglass;  Surgeon: Wilhelmenia Aloha Raddle., MD;  Location: [facility] ENDOSCOPY;  Service: Gastroenterology;  Laterality: N/A;   ESOPHAGOGASTRODUODENOSCOPY  04/29/2010   ESOPHAGOGASTRODUODENOSCOPY (EGD) WITH PROPOFOL  N/A 04/01/2018   Procedure: ESOPHAGOGASTRODUODENOSCOPY (EGD) WITH PROPOFOL ;  Surgeon: Wilhelmenia Aloha Raddle., MD;  Location: [facility] ENDOSCOPY;  Service: Gastroenterology;  Laterality: N/A;   ESOPHAGOGASTRODUODENOSCOPY (EGD) WITH PROPOFOL  N/A 09/12/2018   Procedure: ESOPHAGOGASTRODUODENOSCOPY (EGD) WITH PROPOFOL ;  Surgeon: Wilhelmenia Aloha Raddle., MD;  Location: [facility] ENDOSCOPY;  Service: Gastroenterology;  Laterality: N/A;   ESOPHAGOGASTRODUODENOSCOPY (EGD) WITH PROPOFOL  N/A 11/28/2018   Procedure: ESOPHAGOGASTRODUODENOSCOPY (EGD) WITH PROPOFOL ;  Surgeon: Wilhelmenia Aloha Raddle., MD;  Location: [facility] ENDOSCOPY;  Service: Gastroenterology;  Laterality: N/A;   ESOPHAGOGASTRODUODENOSCOPY (EGD) WITH PROPOFOL  N/A 03/11/2020   Procedure: ESOPHAGOGASTRODUODENOSCOPY (EGD) WITH PROPOFOL ;  Surgeon: Wilhelmenia Aloha Raddle., MD;  [facility];  Service: Gastroenterology;  Laterality: N/A;   ESOPHAGOGASTRODUODENOSCOPY (EGD) WITH PROPOFOL  N/A 06/03/2020   Procedure: ESOPHAGOGASTRODUODENOSCOPY (EGD) WITH PROPOFOL ;  Surgeon: Wilhelmenia Aloha Raddle., MD;  Location[facility];  Service: Gastroenterology;  Laterality: N/A;   EUS  04/01/2018   Procedure: FULL UPPER ENDOSCOPIC ULTRASOUND (EUS) RADIAL;  Surgeon: Wilhelmenia Aloha Raddle., MD;  Location: [facility] ENDOSCOPY;  Service: Gastroenterology;;   EUS N/A 09/12/2018   Procedure: UPPER ENDOSCOPIC ULTRASOUND (EUS) RADIAL;  Surgeon: Wilhelmenia Aloha Raddle., MD;  Location: [facility] ENDOSCOPY;  Service: Gastroenterology;  Laterality: N/A;   FINE  NEEDLE ASPIRATION  09/12/2018   Procedure: FINE NEEDLE ASPIRATION (FNA) LINEAR;  Surgeon:  Wilhelmenia Aloha Raddle., MD;  Location: [facility] ENDOSCOPY;  Service: Gastroenterology;;   HAND SURGERY Left 2018   saw accident   HEMOSTASIS CLIP PLACEMENT  09/12/2018   Procedure: HEMOSTASIS CLIP PLACEMENT;  Surgeon: Wilhelmenia Aloha Raddle., MD;  Location: [facility] ENDOSCOPY;  Service: Gastroenterology;;   HEMOSTASIS CLIP PLACEMENT  06/03/2020   Procedure: HEMOSTASIS CLIP PLACEMENT;  Surgeon: Wilhelmenia Aloha Raddle., MD;  Location: [facility]OSCOPY;  Service: Gastroenterology;;   I & D EXTREMITY Left 11/24/2016   Procedure: IRRIGATION AND DEBRIDEMENT LEFT HAND, THUMB, INDEX, MIDDLE, RING, AND SMALL FINGERS WITH RECONSTRUCTION;  Surgeon: Camella Fallow, MD;  Location: [facility];  Service: Orthopedics;  Laterality: Left;   KNEE ARTHROSCOPY Left    LEFT HEART CATH AND CORS/GRAFTS ANGIOGRAPHY N/A 01/05/2019   Procedure: LEFT HEART CATH AND CORS/GRAFTS ANGIOGRAPHY;  Surgeon: Mady Bruckner, MD;  Location: [facility];  Service: Cardiovascular;  Laterality: N/A;   LUMBAR LAMINECTOMY     and fusion x 2   NASAL SINUS SURGERY     POPLITEAL SYNOVIAL CYST EXCISION     REMOVAL OF STONES  04/01/2018   Procedure: REMOVAL OF STONES;  Surgeon: Wilhelmenia Aloha Raddle., MD;  Location: [facility] ENDOSCOPY;  Service: Gastroenterology;;   REMOVAL OF STONES  09/12/2018   Procedure: REMOVAL OF STONES;  Surgeon: Wilhelmenia Aloha Raddle., MD;  Location: [facility] ENDOSCOPY;  Service: Gastroenterology;;   REMOVAL OF STONES  11/28/2018   Procedure: REMOVAL OF STONES;  Surgeon: Wilhelmenia Aloha Raddle., MD;  Location: [facility] ENDOSCOPY;  Service: Gastroenterology;;   REMOVAL OF STONES  03/11/2020   Procedure: REMOVAL OF STONES;  Surgeon: Wilhelmenia Aloha Raddle., MD;  Location: [facility];  Service: Gastroenterology;;   REMOVAL OF STONES  06/03/2020   Procedure: REMOVAL OF STONES;  Surgeon: Wilhelmenia Aloha Raddle., MD;  Location: [facility];  Service: Gastroenterology;;   ROBOT ASSITED LAPAROSCOPIC NEPHROURETERECTOMY Right 06/03/2022    Procedure: XI ROBOT ASSITED LAPAROSCOPIC NEPHROURETERECTOMY, FLEXIBLE CYSTOSCOPY;  Surgeon: Alvaro Ricardo KATHEE Raddle., MD;  Location: [facility];  Service: Urology;  Laterality: Right;  3 HRS   SAVORY DILATION N/A 09/12/2018   Procedure: SAVORY DILATION;  Surgeon: Wilhelmenia Aloha Raddle., MD;  Location: [facility] ENDOSCOPY;  Service: Gastroenterology;  Laterality: N/A;   SAVORY DILATION N/A 11/28/2018   Procedure: SAVORY DILATION;  Surgeon: Wilhelmenia Aloha Raddle., MD;  Location: [facility] ENDOSCOPY;  Service: Gastroenterology;  Laterality: N/A;   SEPTOPLASTY Bilateral 05/26/2021   Procedure: SEPTOPLASTY;  Surgeon: Karis Clunes, MD;  Location: [facility];  Service: ENT;  Laterality: Bilateral;   SPHINCTEROTOMY  04/01/2018   Procedure: ANNETT;  Surgeon: Mansouraty, Aloha Raddle., MD;  Location: [facility] ENDOSCOPY;  Service: Gastroenterology;;   LAHOMA CHOLANGIOSCOPY N/A 11/28/2018   Procedure: DEBHOJDD CHOLANGIOSCOPY;  Surgeon: Wilhelmenia Aloha Raddle., MD;  Location: [facility] ENDOSCOPY;  Service: Gastroenterology;  Laterality: N/A;   SPYGLASS CHOLANGIOSCOPY N/A 06/03/2020   Procedure: SPYGLASS CHOLANGIOSCOPY;  Surgeon: Wilhelmenia Aloha Raddle., MD;  Location[facility]OS[facility];  Service: Gastroenterology;  Laterality: N/A;   STENT REMOVAL  09/12/2018   Procedure: STENT REMOVAL;  Surgeon: Wilhelmenia Aloha Raddle., MD;  Location: [facility] ENDOSCOPY;  Service: Gastroenterology;;   CLEDA REMOVAL  11/28/2018   Procedure: STENT REMOVAL;  Surgeon: Wilhelmenia Aloha Raddle., MD;  Location: [facility] ENDOSCOPY;  Service: Gastroenterology;;   CLEDA REMOVAL  03/11/2020   Procedure: STENT REMOVAL;  Surgeon: Wilhelmenia Aloha Raddle., MD;  Location: [facility];  Service: Gastroenterology;;   SUBMUCOSAL LIFTING INJECTION  09/12/2018   Procedure: SUBMUCOSAL LIFTING INJECTION;  Surgeon: Wilhelmenia Aloha Raddle., MD;  Location: [facility] ENDOSCOPY;  Service: Gastroenterology;;    Social History:  reports that he quit smoking about 47 years ago. His  smoking use included cigarettes. He started smoking about 52 years ago. He has a 2.5 pack-year smoking history. He has never used smokeless tobacco. He reports that he does not drink alcohol and does not use drugs.  Allergies: No Known Allergies  Family History:  Family History  Problem Relation Age of Onset   Melanoma Mother    Stroke Father    Hypertension Father    Coronary artery disease Other    Colon cancer Neg Hx    Esophageal cancer Neg Hx    Stomach cancer Neg Hx    Rectal cancer Neg Hx    Pancreatic cancer Neg Hx    Liver disease Neg Hx    Inflammatory bowel disease Neg Hx      Current Outpatient Medications:    acetaminophen  (TYLENOL ) 500 MG tablet, Take 1,000 mg by mouth daily as needed for moderate pain (pain score 4-6) or mild pain (pain score 1-3)., Disp: , Rfl:    albuterol  (VENTOLIN  HFA) 108 (90 Base) MCG/ACT inhaler, Inhale 2 puffs into the lungs every 6 (six) hours as needed for wheezing or shortness of breath., Disp: 1 each, Rfl: 6   amLODipine  (NORVASC ) 5 MG tablet, TAKE 1 TABLET BY MOUTH DAILY, Disp: 100 tablet, Rfl: 1   apixaban  (ELIQUIS ) 2.5 MG TABS tablet, Take 1 tablet (2.5 mg total) by mouth 2 (two) times daily., Disp: 180 tablet, Rfl: 0   clopidogrel  (PLAVIX ) 75 MG tablet, TAKE 1 TABLET BY MOUTH ONCE  DAILY, Disp: 100 tablet, Rfl: 2   Coenzyme Q10 (CO Q-10) 300 MG CAPS, Take 300 mg by mouth every evening., Disp: , Rfl:    cyanocobalamin  (VITAMIN B12) 1000 MCG/ML injection, Inject 1 mL (1,000 mcg total) into the muscle every 30 (thirty) days., Disp: 6 mL, Rfl: 11   cyclobenzaprine  (FLEXERIL ) 5 MG tablet, Take 1 tablet (5 mg total) by mouth at bedtime as needed for muscle spasms., Disp: 30 tablet, Rfl: 0   ezetimibe  (ZETIA ) 10 MG tablet, TAKE 1 TABLET BY MOUTH DAILY, Disp: 100 tablet, Rfl: 1   ferrous sulfate 325 (65 FE) MG EC tablet, Take 325 mg by mouth daily., Disp: , Rfl:    fluticasone  (FLONASE ) 50 MCG/ACT nasal spray, Place 1 spray into both nostrils  daily., Disp: , Rfl:    Fluticasone -Umeclidin-Vilant (TRELEGY ELLIPTA ) 200-62.5-25 MCG/ACT AEPB, Inhale 1 puff into the lungs daily., Disp: 60 each, Rfl: 11   furosemide  (LASIX ) 20 MG tablet, Take 1 tablet (20 mg total) by mouth every other day. (Patient taking differently: Take 20 mg by mouth as needed for edema or fluid.), Disp: 15 tablet, Rfl: 0   isosorbide  mononitrate (IMDUR ) 30 MG 24 hr tablet, Take 1 tablet (30 mg total) by mouth daily., Disp: 90 tablet, Rfl: 0   Menthol, Topical Analgesic, (BENGAY EX), Apply 1 application topically daily as needed (pain)., Disp: , Rfl:    Multiple Vitamin (MULTIVITAMIN) tablet, Take 1 tablet by mouth daily., Disp: , Rfl:    nebivolol  (BYSTOLIC ) 10 MG tablet, Take 0.5 tablets (5 mg total) by mouth daily., Disp: 45 tablet, Rfl: 0   nitroGLYCERIN  (NITROSTAT ) 0.4 MG SL tablet, PLACE 1 TABLET UNDER THE TONGUE EVERY 5 MINUTES AS NEEDED FOR CHEST PAIN, Disp: 25 tablet, Rfl: 0   Nystatin (GERHARDT'S BUTT CREAM) CREA,  Apply 1 Application topically daily., Disp: 60 each, Rfl: 0   pantoprazole  (PROTONIX ) 40 MG tablet, Take 1 tablet (40 mg total) by mouth daily., Disp: 100 tablet, Rfl: 3   rOPINIRole  (REQUIP ) 2 MG tablet, TAKE 1 TABLET BY MOUTH AT  BEDTIME, Disp: 100 tablet, Rfl: 1   rosuvastatin  (CRESTOR ) 10 MG tablet, TAKE 1 TABLET BY MOUTH DAILY, Disp: 90 tablet, Rfl: 2   saccharomyces boulardii (FLORASTOR) 250 MG capsule, Take 250 mg by mouth every evening., Disp: , Rfl:    sodium zirconium cyclosilicate  (LOKELMA ) 5 g packet, Take 5 g by mouth daily., Disp: , Rfl:    venlafaxine  (EFFEXOR ) 100 MG tablet, Take 1 tablet (100 mg total) by mouth daily., Disp: 90 tablet, Rfl: 1   oxyCODONE -acetaminophen  (PERCOCET) 10-325 MG tablet, Take 1 tablet by mouth 2 (two) times daily., Disp: 60 tablet, Rfl: 0   oxyCODONE -acetaminophen  (PERCOCET) 10-325 MG tablet, Take 1 tablet by mouth 2 (two) times daily., Disp: 60 tablet, Rfl: 0   oxyCODONE -acetaminophen  (PERCOCET) 10-325 MG  tablet, Take 1 tablet by mouth 2 (two) times daily., Disp: 60 tablet, Rfl: 0  Review of Systems:  Negative unless indicated in HPI.   Physical Exam: Vitals:   10/20/23 0906  BP: 120/64  Pulse: 64  Temp: 98.3 F (36.8 C)  TempSrc: Oral  SpO2: 95%  Weight: 153 lb 14.4 oz (69.8 kg)    Body mass index is 23.4 kg/m.   Physical Exam Vitals reviewed.  Constitutional:      Appearance: Normal appearance.  HENT:     Head: Normocephalic and atraumatic.  Eyes:     Conjunctiva/sclera: Conjunctivae normal.  Cardiovascular:     Rate and Rhythm: Normal rate and regular rhythm.  Pulmonary:     Effort: Pulmonary effort is normal.     Breath sounds: Normal breath sounds.  Skin:    General: Skin is warm and dry.  Neurological:     General: No focal deficit present.     Mental Status: He is alert and oriented to person, place, and time.  Psychiatric:        Mood and Affect: Mood normal.        Behavior: Behavior normal.        Thought Content: Thought content normal.        Judgment: Judgment normal.      Impression and Plan:  Diabetes mellitus type 2, noninsulin dependent (HCC) Assessment & Plan: Well-controlled with an A1c of 5.2.  Orders: -     POCT glycosylated hemoglobin (Hb A1C)  Chronic pain syndrome Assessment & Plan: PDMP reviewed, no red flags, overdose rescore is 110.  Refill oxycodone  x 3 months.  Orders: -     oxyCODONE -Acetaminophen ; Take 1 tablet by mouth 2 (two) times daily.  Dispense: 60 tablet; Refill: 0 -     oxyCODONE -Acetaminophen ; Take 1 tablet by mouth 2 (two) times daily.  Dispense: 60 tablet; Refill: 0 -     oxyCODONE -Acetaminophen ; Take 1 tablet by mouth 2 (two) times daily.  Dispense: 60 tablet; Refill: 0  Essential hypertension Assessment & Plan: Well-controlled on current.   Other specified hypothyroidism Assessment & Plan: On levothyroxine, last TSH was normal at 3.150.   CKD (chronic kidney disease), stage IV (HCC) Assessment &  Plan: Most recent creatinine 3.360 with a GFR of 16.  Followed by nephrology.  Is getting labs every 3 weeks at this point due to hyperkalemia.   Immunization due  -PCV 20 administered in office today.  Time spent:33 minutes reviewing chart, interviewing and examining patient and formulating plan of care.     Tully Theophilus Andrews, MD [facility]

## 2023-10-20 NOTE — Assessment & Plan Note (Signed)
 PDMP reviewed, no red flags, overdose rescore is 110.  Refill oxycodone  x 3 months.

## 2023-10-25 ENCOUNTER — Ambulatory Visit: Admitting: Physical Therapy

## 2023-10-25 NOTE — Therapy (Incomplete)
 OUTPATIENT PHYSICAL THERAPY NEURO TREATMENT NOTE   Patient Name: [name] MRN: [id number] DOB:[birth year], 83 y.o., male Today's Date: 10/25/2023   PCP: Theophilus Andrews, Tully GRADE, MD  REFERRING PROVIDER: Jonel Lonni SQUIBB, MD  END OF SESSION:    Past Medical History:  Diagnosis Date   Allergy     Anxiety    B12 deficiency anemia    Blood transfusion without reported diagnosis    CAD (coronary artery disease)    Cancer (HCC)    bladder, Right renal cell also   Chronic kidney disease    Colon polyps    COPD (chronic obstructive pulmonary disease) (HCC)    Depression    Diabetes mellitus (HCC)    Dyspnea    Esophagus, Barrett's    GERD (gastroesophageal reflux disease)    Headache    History of bladder cancer    Bladder cancer 8 times   History of hiatal hernia    Hyperlipidemia    Hypertension    Localized osteoarthrosis, lower leg    Myocardial infarction (HCC) 2021   Pneumonia    Restless leg syndrome    Sleep apnea    does not wear cpap   Stenosis of esophagus    Past Surgical History:  Procedure Laterality Date   ABDOMINAL AORTOGRAM N/A 08/03/2023   Procedure: ABDOMINAL AORTOGRAM;  Surgeon: Serene Gaile LELON, MD;  [facility]: [facility];  Service: Cardiovascular;  Laterality: N/A;   BILIARY BRUSHING  04/01/2018   Procedure: BILIARY BRUSHING;  Surgeon: Wilhelmenia Aloha Raddle., MD;  Location: [facility] ENDOSCOPY;  Service: Gastroenterology;;   BILIARY BRUSHING  09/12/2018   Procedure: BILIARY BRUSHING;  Surgeon: Wilhelmenia Aloha Raddle., MD;  Location: [facility] ENDOSCOPY;  Service: Gastroenterology;;   BILIARY BRUSHING  11/28/2018   Procedure: BILIARY BRUSHING;  Surgeon: Wilhelmenia Aloha Raddle., MD;  Location: [facility] ENDOSCOPY;  Service: Gastroenterology;;   BILIARY BRUSHING  03/11/2020   Procedure: BILIARY BRUSHING;  Surgeon: Wilhelmenia Aloha Raddle., MD;  Location: [facility] ENDOSCOPY;  Service: Gastroenterology;;   BILIARY DILATION  09/12/2018   Procedure:  BILIARY DILATION;  Surgeon: Wilhelmenia Aloha Raddle., MD;  Location: [facility] ENDOSCOPY;  Service: Gastroenterology;;   BILIARY DILATION  11/28/2018   Procedure: BILIARY DILATION;  Surgeon: Wilhelmenia Aloha Raddle., MD;  Location: [facility] ENDOSCOPY;  Service: Gastroenterology;;   BILIARY DILATION  03/11/2020   Procedure: BILIARY DILATION;  Surgeon: Wilhelmenia Aloha Raddle., MD;  Location: [facility];  Service: Gastroenterology;;   BILIARY STENT PLACEMENT  04/01/2018   Procedure: BILIARY STENT PLACEMENT;  Surgeon: Wilhelmenia Aloha Raddle., MD;  Location: [facility] ENDOSCOPY;  Service: Gastroenterology;;   BILIARY STENT PLACEMENT  09/12/2018   Procedure: BILIARY STENT PLACEMENT;  Surgeon: Wilhelmenia Aloha Raddle., MD;  Location: [facility] ENDOSCOPY;  Service: Gastroenterology;;   BILIARY STENT PLACEMENT  11/28/2018   Procedure: BILIARY STENT PLACEMENT;  Surgeon: Wilhelmenia Aloha Raddle., MD;  Location: [facility] ENDOSCOPY;  Service: Gastroenterology;;   BIOPSY  04/01/2018   Procedure: BIOPSY;  Surgeon: Wilhelmenia Aloha Raddle., MD;  Location: [facility] ENDOSCOPY;  Service: Gastroenterology;;   BIOPSY  09/12/2018   Procedure: BIOPSY;  Surgeon: Wilhelmenia Aloha Raddle., MD;  Location: [facility] ENDOSCOPY;  Service: Gastroenterology;;   BIOPSY  11/28/2018   Procedure: BIOPSY;  Surgeon: Wilhelmenia Aloha Raddle., MD;  Location: [facility] ENDOSCOPY;  Service: Gastroenterology;;   BIOPSY  03/11/2020   Procedure: BIOPSY;  Surgeon: Wilhelmenia Aloha Raddle., MD;  Location: [facility] ENDOSCOPY;  Service: Gastroenterology;;   bladder cancer      x 8 cystoscopy   CERVICAL DISCECTOMY  ACDF   CHOLECYSTECTOMY     COLONOSCOPY  11/17/2005   normal    CORONARY ARTERY BYPASS GRAFT     x4   CORONARY STENT INTERVENTION N/A 01/05/2019   Procedure: CORONARY STENT INTERVENTION;  Surgeon: Mady Bruckner, MD;  [facility];  Service: Cardiovascular;  Laterality: N/A;   CYSTOSCOPY WITH BIOPSY N/A 03/27/2022   Procedure: CYSTOSCOPY WITH BIOPSY;  Surgeon: Watt Rush, MD;  Location: [facility] ORS;  Service: Urology;  Laterality: N/A;   CYSTOSCOPY WITH RETROGRADE PYELOGRAM, URETEROSCOPY AND STENT PLACEMENT Right 03/27/2022   Procedure: CYSTOSCOPY WITH RIGHT RETROGRADE PYELOGRAM, URETEROSCOPY AND STENT PLACEMENT urethral dilation;  Surgeon: Watt Rush, MD;  Location: [facility] ORS;  Service: Urology;  Laterality: Right;   ENDOSCOPIC MUCOSAL RESECTION  09/12/2018   Procedure: ENDOSCOPIC MUCOSAL RESECTION;  Surgeon: Wilhelmenia Aloha Raddle., MD;  Location: [facility] ENDOSCOPY;  Service: Gastroenterology;;   ENDOSCOPIC RETROGRADE CHOLANGIOPANCREATOGRAPHY (ERCP) WITH PROPOFOL  N/A 04/01/2018   Procedure: ENDOSCOPIC RETROGRADE CHOLANGIOPANCREATOGRAPHY (ERCP) WITH PROPOFOL ;  Surgeon: Wilhelmenia Aloha Raddle., MD;  Location: [facility] ENDOSCOPY;  Service: Gastroenterology;  Laterality: N/A;   ENDOSCOPIC RETROGRADE CHOLANGIOPANCREATOGRAPHY (ERCP) WITH PROPOFOL  N/A 09/12/2018   Procedure: ENDOSCOPIC RETROGRADE CHOLANGIOPANCREATOGRAPHY (ERCP) WITH PROPOFOL ;  Surgeon: Wilhelmenia Aloha Raddle., MD;  Location: [facility] ENDOSCOPY;  Service: Gastroenterology;  Laterality: N/A;   ENDOSCOPIC RETROGRADE CHOLANGIOPANCREATOGRAPHY (ERCP) WITH PROPOFOL  N/A 03/11/2020   Procedure: ENDOSCOPIC RETROGRADE CHOLANGIOPANCREATOGRAPHY (ERCP) WITH PROPOFOL ;  Surgeon: Wilhelmenia Aloha Raddle., MD;  Location[facility] ENDOSCOPY;  Service: Gastroenterology;  Laterality: N/A;   ENDOSCOPIC RETROGRADE CHOLANGIOPANCREATOGRAPHY (ERCP) WITH PROPOFOL  N/A 06/03/2020   Procedure: ENDOSCOPIC RETROGRADE CHOLANGIOPANCREATOGRAPHY (ERCP) WITH PROPOFOL ;  Surgeon: Wilhelmenia Aloha Raddle., MD;  Location[facility] ENDOSCOPY;  Service: Gastroenterology;  Laterality: N/A;   ERCP N/A 11/28/2018   Procedure: ENDOSCOPIC RETROGRADE CHOLANGIOPANCREATOGRAPHY (ERCP) +EGD with spyglass;  Surgeon: Wilhelmenia Aloha Raddle., MD;  Location: [facility] ENDOSCOPY;  Service: Gastroenterology;  Laterality: N/A;   ESOPHAGOGASTRODUODENOSCOPY  04/29/2010   ESOPHAGOGASTRODUODENOSCOPY (EGD)  WITH PROPOFOL  N/A 04/01/2018   Procedure: ESOPHAGOGASTRODUODENOSCOPY (EGD) WITH PROPOFOL ;  Surgeon: Wilhelmenia Aloha Raddle., MD;  Location: [facility] ENDOSCOPY;  Service: Gastroenterology;  Laterality: N/A;   ESOPHAGOGASTRODUODENOSCOPY (EGD) WITH PROPOFOL  N/A 09/12/2018   Procedure: ESOPHAGOGASTRODUODENOSCOPY (EGD) WITH PROPOFOL ;  Surgeon: Wilhelmenia Aloha Raddle., MD;  Location: [facility] ENDOSCOPY;  Service: Gastroenterology;  Laterality: N/A;   ESOPHAGOGASTRODUODENOSCOPY (EGD) WITH PROPOFOL  N/A 11/28/2018   Procedure: ESOPHAGOGASTRODUODENOSCOPY (EGD) WITH PROPOFOL ;  Surgeon: Wilhelmenia Aloha Raddle., MD;  Location: [facility] ENDOSCOPY;  Service: Gastroenterology;  Laterality: N/A;   ESOPHAGOGASTRODUODENOSCOPY (EGD) WITH PROPOFOL  N/A 03/11/2020   Procedure: ESOPHAGOGASTRODUODENOSCOPY (EGD) WITH PROPOFOL ;  Surgeon: Wilhelmenia Aloha Raddle., MD;  Location[facility]OSCOPY;  Service: Gastroenterology;  Laterality: N/A;   ESOPHAGOGASTRODUODENOSCOPY (EGD) WITH PROPOFOL  N/A 06/03/2020   Procedure: ESOPHAGOGASTRODUODENOSCOPY (EGD) WITH PROPOFOL ;  Surgeon: Wilhelmenia Aloha Raddle., MD;  Location[facility] ENDOSCOPY;  Service: Gastroenterology;  Laterality: N/A;   EUS  04/01/2018   Procedure: FULL UPPER ENDOSCOPIC ULTRASOUND (EUS) RADIAL;  Surgeon: Wilhelmenia Aloha Raddle., MD;  Location: [facility] ENDOSCOPY;  Service: Gastroenterology;;   EUS N/A 09/12/2018   Procedure: UPPER ENDOSCOPIC ULTRASOUND (EUS) RADIAL;  Surgeon: Wilhelmenia Aloha Raddle., MD;  Location: [facility] ENDOSCOPY;  Service: Gastroenterology;  Laterality: N/A;   FINE NEEDLE ASPIRATION  09/12/2018   Procedure: FINE NEEDLE ASPIRATION (FNA) LINEAR;  Surgeon: Wilhelmenia Aloha Raddle., MD;  Location: [facility] ENDOSCOPY;  Service: Gastroenterology;;   HAND SURGERY Left 2018   saw accident   HEMOSTASIS CLIP PLACEMENT  09/12/2018   Procedure: HEMOSTASIS CLIP PLACEMENT;  Surgeon: Wilhelmenia Aloha Raddle., MD;  Location: [facility]  ENDOSCOPY;  Service: Gastroenterology;;   HEMOSTASIS CLIP PLACEMENT  06/03/2020    Procedure: HEMOSTASIS CLIP PLACEMENT;  Surgeon: Wilhelmenia Aloha Raddle., MD;  Location: [facility] ENDOSCOPY;  Service: Gastroenterology;;   I & D EXTREMITY Left 11/24/2016   Procedure: IRRIGATION AND DEBRIDEMENT LEFT HAND, THUMB, INDEX, MIDDLE, RING, AND SMALL FINGERS WITH RECONSTRUCTION;  Surgeon: Camella Fallow, MD;  Location: [facility] OR;  Service: Orthopedics;  Laterality: Left;   KNEE ARTHROSCOPY Left    LEFT HEART CATH AND CORS/GRAFTS ANGIOGRAPHY N/A 01/05/2019   Procedure: LEFT HEART CATH AND CORS/GRAFTS ANGIOGRAPHY;  Surgeon: Mady Bruckner, MD;  Location: [facility];  Service: Cardiovascular;  Laterality: N/A;   LUMBAR LAMINECTOMY     and fusion x 2   NASAL SINUS SURGERY     POPLITEAL SYNOVIAL CYST EXCISION     REMOVAL OF STONES  04/01/2018   Procedure: REMOVAL OF STONES;  Surgeon: Wilhelmenia Aloha Raddle., MD;  Location: [facility] ENDOSCOPY;  Service: Gastroenterology;;   REMOVAL OF STONES  09/12/2018   Procedure: REMOVAL OF STONES;  Surgeon: Wilhelmenia Aloha Raddle., MD;  Location: [facility] ENDOSCOPY;  Service: Gastroenterology;;   REMOVAL OF STONES  11/28/2018   Procedure: REMOVAL OF STONES;  Surgeon: Wilhelmenia Aloha Raddle., MD;  Location: [facility] ENDOSCOPY;  Service: Gastroenterology;;   REMOVAL OF STONES  03/11/2020   Procedure: REMOVAL OF STONES;  Surgeon: Wilhelmenia Aloha Raddle., MD;  Location: [facility] ENDOSCOPY;  Service: Gastroenterology;;   REMOVAL OF STONES  06/03/2020   Procedure: REMOVAL OF STONES;  Surgeon: Wilhelmenia Aloha Raddle., MD;  Location: [facility] ENDOSCOPY;  Service: Gastroenterology;;   ROBOT ASSITED LAPAROSCOPIC NEPHROURETERECTOMY Right 06/03/2022   Procedure: XI ROBOT ASSITED LAPAROSCOPIC NEPHROURETERECTOMY, FLEXIBLE CYSTOSCOPY;  Surgeon: Alvaro Ricardo KATHEE Raddle., MD;  Location: [facility] ORS;  Service: Urology;  Laterality: Right;  3 HRS   SAVORY DILATION N/A 09/12/2018   Procedure: SAVORY DILATION;  Surgeon: Wilhelmenia Aloha Raddle., MD;  Location: [facility] ENDOSCOPY;  Service: Gastroenterology;   Laterality: N/A;   SAVORY DILATION N/A 11/28/2018   Procedure: SAVORY DILATION;  Surgeon: Wilhelmenia Aloha Raddle., MD;  Location: [facility] ENDOSCOPY;  Service: Gastroenterology;  Laterality: N/A;   SEPTOPLASTY Bilateral 05/26/2021   Procedure: SEPTOPLASTY;  Surgeon: Karis Clunes, MD;  Location: [facility];  Service: ENT;  Laterality: Bilateral;   SPHINCTEROTOMY  04/01/2018   Procedure: ANNETT;  Surgeon: Mansouraty, Aloha Raddle., MD;  Location: [facility] ENDOSCOPY;  Service: Gastroenterology;;   LAHOMA CHOLANGIOSCOPY N/A 11/28/2018   Procedure: DEBHOJDD CHOLANGIOSCOPY;  Surgeon: Wilhelmenia Aloha Raddle., MD;  Location: [facility] ENDOSCOPY;  Service: Gastroenterology;  Laterality: N/A;   SPYGLASS CHOLANGIOSCOPY N/A 06/03/2020   Procedure: SPYGLASS CHOLANGIOSCOPY;  Surgeon: Wilhelmenia Aloha Raddle., MD;  Location: [facility] ENDOSCOPY;  Service: Gastroenterology;  Laterality: N/A;   STENT REMOVAL  09/12/2018   Procedure: STENT REMOVAL;  Surgeon: Wilhelmenia Aloha Raddle., MD;  Location: [facility] ENDOSCOPY;  Service: Gastroenterology;;   CLEDA REMOVAL  11/28/2018   Procedure: STENT REMOVAL;  Surgeon: Wilhelmenia Aloha Raddle., MD;  Location: [facility] ENDOSCOPY;  Service: Gastroenterology;;   CLEDA REMOVAL  03/11/2020   Procedure: STENT REMOVAL;  Surgeon: Wilhelmenia Aloha Raddle., MD;  Location: [facility]OSCOPY;  Service: Gastroenterology;;   SUBMUCOSAL LIFTING INJECTION  09/12/2018   Procedure: SUBMUCOSAL LIFTING INJECTION;  Surgeon: Wilhelmenia Aloha Raddle., MD;  Location: [facility] ENDOSCOPY;  Service: Gastroenterology;;   Patient Active Problem List   Diagnosis Date Noted   Typical atrial flutter (HCC) 09/13/2023   CHF exacerbation (HCC) 09/08/2023   Elevated brain natriuretic peptide (BNP) level 09/08/2023   CKD (chronic  kidney disease), stage IV (HCC) 09/08/2023   History of CAD (coronary artery disease) 09/08/2023   History of anxiety 09/08/2023   History of renal cell cancer s/p  nephrectomy 09/08/2023   Chronic pain  syndrome 09/08/2023   C. difficile diarrhea 07/10/2023   AKI (acute kidney injury) 07/07/2023   Cancer of renal pelvis, right (HCC) 06/03/2022   Bloating 04/27/2020   Gas pain 04/27/2020   Abdominal cramping 04/27/2020   Functional abdominal pain syndrome 11/12/2019   Lower extremity edema 11/12/2019   Hyperlipidemia    History of cholecystectomy 01/19/2019   Chronic diarrhea 01/19/2019   Antiplatelet or antithrombotic long-term use 01/19/2019   Non-ST elevation (NSTEMI) myocardial infarction [facility])    Acute hypoxemic respiratory failure (HCC) 01/04/2019   Multifocal pneumonia 01/04/2019   Sepsis due to pneumonia (HCC) 01/04/2019   Elevated troponin 01/04/2019   Acute on chronic diastolic (congestive) heart failure (HCC) 01/04/2019   Acute respiratory failure (HCC) 01/04/2019   elevated IgG4 11/01/2018   Choledocholithiasis 10/27/2018   Dilation of biliary tract 10/27/2018   Esophageal dysphagia 10/27/2018   Upper airway cough syndrome 07/28/2018   Pleural effusion on left 07/28/2018   History of pancreatitis 05/12/2018   Abnormal LFTs 05/12/2018   Abnormal findings on esophagogastroduodenoscopy (EGD) 05/12/2018   History of biliary stent insertion 05/12/2018   Anemia 05/12/2018   Biliary stricture (HCC)    ESBL (extended spectrum beta-lactamase) producing bacteria infection 04/04/2018   Ascending cholangitis (HCC) 04/01/2018   Diabetes mellitus type 2, noninsulin dependent (HCC) 03/31/2018   Pancreatitis 03/31/2018   Sepsis (HCC) 03/31/2018   Bacteremia due to Gram-negative bacteria 03/31/2018   Abdominal pain    Dilated bile duct    Acute biliary pancreatitis 03/30/2018   Leukocytosis 03/30/2018   CAD (coronary artery disease) 03/30/2018   Osteoarthritis of left knee 10/18/2017   Cervical radiculopathy 10/04/2017   Pain of left hand 07/19/2017   Pre-operative cardiovascular examination 06/16/2017   CKD (chronic kidney disease), stage III 06/16/2017   Carpal tunnel  syndrome 05/27/2017   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 05/10/2017   Open fracture of base of middle phalanx of finger 02/17/2017   Laceration of index finger 02/17/2017   Laceration of nail bed of finger 02/17/2017   Laceration of thumb 02/17/2017   Open fracture of distal phalanx of finger 02/17/2017   Open fractures of multiple sites of phalanx of left hand 11/24/2016   B12 deficiency 05/28/2015   GERD (gastroesophageal reflux disease) 11/16/2014   Hyperlipidemia LDL goal <70 11/21/2013   DOE (dyspnea on exertion) 11/21/2013   Other dysphagia 07/14/2013   History of esophageal stricture 07/14/2013   Hx of CABG 06/05/2013   Leg pain, bilateral 10/18/2012   Restless leg syndrome 06/21/2012   ULNAR NEUROPATHY, LEFT 08/05/2009   Urinary obstruction 10/19/2008   ACTINIC KERATOSIS 06/25/2008   DRY EYE SYNDROME 10/13/2007   CARCINOMA, BLADDER, HX OF 07/02/2007   BARRETTS ESOPHAGUS 05/23/2007   Hypothyroidism 04/08/2007   Other malaise and fatigue 04/08/2007   Essential hypertension 10/12/2006   ANEMIA, B12 DEFICIENCY 09/08/2006   Depression 07/29/2006   NEUROPATHY, IDIOPATHIC PERIPHERAL NEC 07/29/2006   Allergic rhinitis 07/29/2006   LOW BACK PAIN 07/29/2006   COLONIC POLYPS 11/04/2000    ONSET DATE: 8/19/250   REFERRING DIAG: J18.9 (ICD-10-CM) - Community acquired pneumonia of left lower lobe of lung  THERAPY DIAG:  No diagnosis found.  Rationale for Evaluation and Treatment: Habilitation  SUBJECTIVE:  SUBJECTIVE STATEMENT: ***I'm much better since I was in the hospital. Able to do some things around the house like sowing and watering grass, riding the lawnmower. Reports that he has not left his house much for the past 3 years d/t chronic pain from neck all the way down to my feet  as well as SOB.    Pt accompanied by: self  PERTINENT HISTORY: Anxiety, CAD, bladder CA, CKD, COPD, depression, DM, hiatal hernia, HLD, HTN, MI 2021, cervical discectomy, CABG x4, lumbar laminectomy and fusion  PAIN:  Are you having pain? Pt reports chronic generalized pain- currently 7-8/10 with meds    PRECAUTIONS: Fall  RED FLAGS: None   WEIGHT BEARING RESTRICTIONS: No  FALLS: Has patient fallen in last 6 months? Yes. Number of falls 2-3  LIVING ENVIRONMENT: Lives with: lives alone Lives in: House/apartment Stairs: 2 steps to enter; 3 story home; bedroom on main floor Has following equipment at home: Single point cane, Environmental consultant - 4 wheeled, Crutches, Tour manager, and Grab bars  PLOF: Independent with basic ADLs and has a cleaning service and daughter assists with laundry and med management   PATIENT GOALS: I'm not really sure you can help me  OBJECTIVE:   TODAY'S TREATMENT: 10/25/2023 Activity Comments                      ---------------------------------- Note: Objective measures were completed at Evaluation unless otherwise noted.  DIAGNOSTIC FINDINGS: none recent    Vitals: 92% spO2, 71 bpm  COGNITION: Overall cognitive status: Within functional limits for tasks assessed   SENSATION: Pt reports numbness in B feet   POSTURE: rounded shoulders, forward head, and increased thoracic kyphosis  LOWER EXTREMITY ROM:     Active  Right Eval Left Eval  Hip flexion    Hip extension    Hip abduction    Hip adduction    Hip internal rotation    Hip external rotation    Knee flexion    Knee extension    Ankle dorsiflexion 16 7  Ankle plantarflexion    Ankle inversion    Ankle eversion     (Blank rows = not tested)  LOWER EXTREMITY MMT:    MMT (in sitting) Right Eval Left Eval  Hip flexion 4+ 4  Hip extension    Hip abduction 4+ 4+  Hip adduction 4 4  Hip internal rotation    Hip external rotation    Knee flexion 5 5  Knee extension 5 5   Ankle dorsiflexion 4+ 4  Ankle plantarflexion 4+ 3+  Ankle inversion    Ankle eversion    (Blank rows = not tested)   GAIT: Findings: Assistive device utilized:None, Level of assistance: SBA, and Comments: trunk flexed and limited arm swing, decreased R>L foot clearance, slowed speed  After ambulating 171ft: 88% spO2, 88bpm   FUNCTIONAL TESTS:  5 times sit to stand: 32.18 sec pushing off knees and not standing fully  10 meter walk test: 17.43 sec (1.89 ft/sec) Vitals: 93% spO2, 83bpm  TREATMENT DATE: 10/03/24    PATIENT EDUCATION: Education details: prognosis, POC, edu on benefits of PT to address pt's exam deficits as they relate to functional impairments, HEP Person educated: Patient Education method: Explanation, Demonstration, Tactile cues, Verbal cues, and Handouts Education comprehension: verbalized understanding and returned demonstration  HOME EXERCISE PROGRAM: Access Code: [email or web address] URL: [email or web address] Date: 10/04/2023 Prepared by: [facility] - Outpatient  Rehab - [facility]  Program Notes Monitor your oxygen levels- generally want at least 90%  Exercises - Sit to Stand with Hands on Knees  - 1 x daily - 5 x weekly - 2 sets - 5 reps - Standing March with Unilateral Counter Support  - 1 x daily - 5 x weekly - 2 sets - 20 reps - Side Stepping with Counter Support  - 1 x daily - 5 x weekly - 2-3 sets - 1 min hold    GOALS: Goals reviewed with patient? Yes  SHORT TERM GOALS: Target date: 10/25/2023  Patient to be independent with initial HEP. Baseline: HEP initiated Goal status: INITIAL    LONG TERM GOALS: Target date: 11/22/23  Patient to be independent with advanced HEP. Baseline: Not yet initiated  Goal status: INITIAL  Patient to demonstrate B LE strength >/=4+/5.  Baseline: See above Goal status:  INITIAL  Patient to score at least 20/24 on DGI in order to decrease risk of falls.  Baseline: NT Goal status: INITIAL  Patient to demonstrate 5xSTS test in <20 sec in order to decrease risk of falls.  Baseline: 32.18 sec pushing off knees and not standing fully Goal status: INITIAL  Patient to ambulate at least 250 ft with SpO2 at least 90% on room air and without excessive SOB. Baseline: After ambulating 143ft: 88% spO2, 88bpm  Goal status: INITIAL  Patient to demonstrate gait speed of at least 2.3 ft/sec in order to improve access to community. Baseline: 1.89 ft/sec Goal status: INITIAL   ASSESSMENT:  CLINICAL IMPRESSION: Pt presents today ***. Skilled PT session focused on ***. Pt needs ***. Pt will continue to benefit from skilled PT towards goals for improved functional mobility and decreased fall risk.   Patient is an 83 y/o M presenting to OPPT with c/o limited activity levels s/p hospitalization 09/07/23-09/14/23 for pneumonia and CHF flare. Patient today presenting with Flexed posture, mild LE weakness, gait deviations, decreased gait speed and transfer speed, and poor endurance. Patient was educated on gentle endurance HEP and reported understanding.  Would benefit from skilled PT services 1 x/week for 6 weeks to address aforementioned impairments in order to optimize level of function.    OBJECTIVE IMPAIRMENTS: Abnormal gait, decreased activity tolerance, decreased balance, decreased endurance, decreased mobility, difficulty walking, decreased strength, postural dysfunction, and pain.   ACTIVITY LIMITATIONS: carrying, lifting, bending, sitting, standing, squatting, sleeping, stairs, transfers, bed mobility, bathing, toileting, dressing, reach over head, hygiene/grooming, and locomotion level  PARTICIPATION LIMITATIONS: meal prep, cleaning, laundry, shopping, community activity, yard work, and church  PERSONAL FACTORS: Age, Financial risk analyst, Past/current experiences, Time since onset  of injury/illness/exacerbation, Transportation, and 3+ comorbidities: Anxiety, CAD, bladder CA, CKD, COPD, depression, DM, hiatal hernia, HLD, HTN, MI 2021, cervical discectomy, CABG x4, lumbar laminectomy and fusion are also affecting patient's functional outcome.   REHAB POTENTIAL: Good  CLINICAL DECISION MAKING: Evolving/moderate complexity  EVALUATION COMPLEXITY: Moderate  PLAN:  PT FREQUENCY: 1x/week  PT DURATION: 6 weeks  PLANNED INTERVENTIONS: 97164- PT Re-evaluation, 97750- Physical Performance Testing, 97110-Therapeutic exercises, 97530- Therapeutic activity, [id number]- Neuromuscular re-education, 97535- Self Care, 02859-  Manual therapy, [id number]- Gait training, 04007- Canalith repositioning, 02886- Aquatic Therapy, [phone]- Electrical stimulation (manual), [phone] (1-2 muscles), [id number] (3+ muscles)- Dry Needling, Patient/Family education, Balance training, Stair training, Taping, Joint mobilization, Spinal mobilization, Vestibular training, Cryotherapy, and Moist heat  PLAN FOR NEXT SESSION: ***DGI; progress HEP for endurance, balance, transfers    Greig Anon, PT 10/25/23 7:52 AM Phone: [phone] Fax: [phone]  [facility] at [facility] [address] [place] [employer], [place] [address] Phone # [phone] Fax # [phone]

## 2023-11-03 ENCOUNTER — Ambulatory Visit: Admitting: Physician Assistant

## 2023-11-03 ENCOUNTER — Ambulatory Visit

## 2023-11-03 DIAGNOSIS — N184 Chronic kidney disease, stage 4 (severe): Secondary | ICD-10-CM | POA: Diagnosis not present

## 2023-11-08 ENCOUNTER — Ambulatory Visit

## 2023-11-10 ENCOUNTER — Ambulatory Visit (HOSPITAL_COMMUNITY)
Admission: RE | Admit: 2023-11-10 | Discharge: 2023-11-10 | Disposition: A | Discharge status: Home / Self Care | Source: Ambulatory Visit | Attending: Emergency Medicine | Admitting: Emergency Medicine

## 2023-11-10 DIAGNOSIS — M79604 Pain in right leg: Secondary | ICD-10-CM | POA: Insufficient documentation

## 2023-11-10 DIAGNOSIS — M79605 Pain in left leg: Secondary | ICD-10-CM | POA: Diagnosis not present

## 2023-11-11 LAB — VAS US ABI WITH/WO TBI
Left ABI: 0.99
Right ABI: 1.08

## 2023-11-15 ENCOUNTER — Ambulatory Visit

## 2023-11-16 ENCOUNTER — Other Ambulatory Visit: Payer: Self-pay | Admitting: Internal Medicine

## 2023-11-16 DIAGNOSIS — M542 Cervicalgia: Secondary | ICD-10-CM | POA: Diagnosis not present

## 2023-11-22 ENCOUNTER — Encounter: Payer: Self-pay | Admitting: Radiology

## 2023-11-22 ENCOUNTER — Ambulatory Visit

## 2023-11-24 ENCOUNTER — Ambulatory Visit: Payer: Self-pay | Admitting: Emergency Medicine

## 2023-11-24 ENCOUNTER — Ambulatory Visit (HOSPITAL_COMMUNITY)
Admission: RE | Admit: 2023-11-24 | Discharge: 2023-11-24 | Disposition: A | Discharge status: Home / Self Care | Source: Ambulatory Visit | Attending: Emergency Medicine | Admitting: Emergency Medicine

## 2023-11-24 DIAGNOSIS — M79605 Pain in left leg: Secondary | ICD-10-CM | POA: Insufficient documentation

## 2023-11-24 DIAGNOSIS — M79604 Pain in right leg: Secondary | ICD-10-CM | POA: Diagnosis not present

## 2023-11-24 DIAGNOSIS — I2510 Atherosclerotic heart disease of native coronary artery without angina pectoris: Secondary | ICD-10-CM | POA: Diagnosis present

## 2023-11-24 DIAGNOSIS — Z951 Presence of aortocoronary bypass graft: Secondary | ICD-10-CM | POA: Insufficient documentation

## 2023-11-24 DIAGNOSIS — I739 Peripheral vascular disease, unspecified: Secondary | ICD-10-CM

## 2023-11-25 NOTE — Telephone Encounter (Signed)
 Spoke with pts daughter regarding test results. Pts daughter verbalized understanding. Pts daughter was advised pt would be referred to [facility] clinic for further evaluation, and they will get a call to schedule an appt. Referral sent. Pts daughter was advised to call our office if her or the pt has any questions.

## 2023-11-30 ENCOUNTER — Encounter: Payer: Self-pay | Admitting: Adult Health

## 2023-11-30 ENCOUNTER — Ambulatory Visit: Admitting: Adult Health

## 2023-11-30 ENCOUNTER — Other Ambulatory Visit: Payer: Self-pay | Admitting: Internal Medicine

## 2023-11-30 VITALS — BP 130/66 | HR 67 | Ht 69.0 in | Wt 147.0 lb

## 2023-11-30 DIAGNOSIS — A419 Sepsis, unspecified organism: Secondary | ICD-10-CM

## 2023-11-30 DIAGNOSIS — R0609 Other forms of dyspnea: Secondary | ICD-10-CM | POA: Diagnosis not present

## 2023-11-30 DIAGNOSIS — J4530 Mild persistent asthma, uncomplicated: Secondary | ICD-10-CM | POA: Diagnosis not present

## 2023-11-30 DIAGNOSIS — K219 Gastro-esophageal reflux disease without esophagitis: Secondary | ICD-10-CM

## 2023-11-30 DIAGNOSIS — J189 Pneumonia, unspecified organism: Secondary | ICD-10-CM | POA: Diagnosis not present

## 2023-11-30 DIAGNOSIS — E038 Other specified hypothyroidism: Secondary | ICD-10-CM

## 2023-11-30 DIAGNOSIS — E440 Moderate protein-calorie malnutrition: Secondary | ICD-10-CM

## 2023-11-30 DIAGNOSIS — R1310 Dysphagia, unspecified: Secondary | ICD-10-CM

## 2023-11-30 DIAGNOSIS — I509 Heart failure, unspecified: Secondary | ICD-10-CM

## 2023-11-30 NOTE — Patient Instructions (Addendum)
 Continue on Trelegy 1 puff daily, rinse after use.  Albuterol  inhaler As needed   Activity as tolerated.   Aspiration precautions as discussed   Keep follow up with Cardiology and Nephrology.   High protein diet.   Set up for HRCT Chest   Follow up up with Dr. Annella or Liahm Grivas NP in 3 months and As needed

## 2023-11-30 NOTE — Progress Notes (Signed)
 @Patient  ID: [name], male    DOB: 1940-04-01, 83 y.o.   MRN: [id number]  Chief Complaint  Patient presents with   Follow-up    Pneumonia f/u    Referring provider: Theophilus Andrews, [place]*  HPI: 83 yo male with minimal smoking history followed for asthma and chronic dyspnea Medical history significant for coronary artery disease status post CABG, renal cell carcinoma status post nephrectomy and stage IV chronic kidney disease, esophageal stenosis s/p dilation, chronic neck/back pain, CHF   social history includes a long career as a scientist, product/process development, with potential exposure to dust and asbestos. He smoked for about five to six years, approximately a pack a day, but quit many years ago.   TEST/EVENTS : Reviewed 11/30/2023  Hospitalization August 2025 sepsis due to community-acquired pneumonia acute hypoxic respiratory failure and decompensated heart failure and new onset a flutter -CT chest showed multifocal ground glass opacities bilaterally with septal wall thickening , consolidative opacities R>L Moderate Right pleural effusion,  -Treated with aggressive antibiotics and diuresis Started on Eliquis    PFT August 11, 2023 normal, normal diffusing capacity  Discussed the use of AI scribe software for clinical note transcription with the patient, who gave verbal consent to proceed.  History of Present Illness [name] is an 83 year old male with presents for 1 month follow-up after recent hospitalization in August for sepsis with multifocal pneumonia and decompensated heart failure.  He experiences persistent shortness of breath, particularly during ambulation within his home, with no improvement since the last visit. He has a history of pneumonia and has been using a Trelegy inhaler since April.  Since last visit says he is doing better.  Still has some shortness of breath and decreased activity tolerance but overall is feeling much better  He  has had multiple episodes of pneumonia and has received a pneumonia vaccination.  Patient has an extensive cardiac history with coronary disease status post CABG, congestive heart failure and chronic kidney disease .  He remains on Eliquis  2.5 mg twice daily.  He has chronic kidney disease and is under the care of nephrologists at [facility]. His kidney function has shown improvement, with labs monitored every three weeks.  We do not have current access to his most recent labs however his daughter is in attendance and says that his most recent renal function has improved.  Regarding his cardiac condition, he was previously in atrial fibrillation but is now in normal sinus rhythm. He monitors his heart rhythm daily using a device connected to his phone and has an upcoming cardiology appointment in December. He has undergone tests for his legs due to concerns about blockages, particularly in the left leg, and is awaiting further evaluation by vascular doctors.  He has experienced significant weight loss, from 145-146 pounds to 131 pounds. He initially consumed protein shakes after his hospital discharge but has since stopped. He experiences weakness and fatigue, particularly when standing or walking, and attributes some of this to his medication regimen.  He has a history of multiple back surgeries and experiences significant pain in his back, hips, legs, and neck. He takes oxycodone  for pain management. He has had surgeries on his shoulder and neck and is awaiting further treatment for his shoulder pain.  He reports a recent decline in vision, with everything appearing out of focus. He is scheduled to see a specialist for evaluation of cataracts and possible macular degeneration. He questions whether his recent illness or  medications could be contributing to his vision changes.     No Known Allergies  Immunization History  Administered Date(s) Administered   Fluad Quad(high Dose 65+)  11/02/2018, 11/09/2019, 10/16/2021   INFLUENZA, HIGH DOSE SEASONAL PF 10/19/2013, 10/22/2014, 09/30/2015, 10/26/2016, 11/19/2017, 11/01/2020, 11/04/2022, 09/28/2023   Influenza Split 10/01/2010, 10/16/2011   Influenza Whole 01/20/2004, 11/24/2006, 10/19/2008, 10/22/2009   Influenza,inj,Quad PF,6+ Mos 10/18/2012   Influenza,inj,quad, With Preservative 10/19/2016   Moderna Covid-19 Fall Seasonal Vaccine 45yrs & older 11/03/2023   Moderna Sars-Covid-2 Vaccination 03/12/2019, 04/03/2019, 12/29/2019, 10/22/2020   PNEUMOCOCCAL CONJUGATE-20 10/20/2023   Pfizer Covid-19 Vaccine Bivalent Booster 27yrs & up 10/22/2020   Pfizer(Comirnaty)Fall Seasonal Vaccine 12 years and older 11/19/2021   Pneumococcal Conjugate-13 05/29/2013   Pneumococcal Polysaccharide-23 10/22/2009   Respiratory Syncytial Virus Vaccine,Recomb Aduvanted(Arexvy) 11/16/2023   Tdap 11/24/2016   Tetanus 05/29/2013    Past Medical History:  Diagnosis Date   Allergy     Anxiety    B12 deficiency anemia    Blood transfusion without reported diagnosis    CAD (coronary artery disease)    Cancer (HCC)    bladder, Right renal cell also   Chronic kidney disease    Colon polyps    COPD (chronic obstructive pulmonary disease) (HCC)    Depression    Diabetes mellitus (HCC)    Dyspnea    Esophagus, Barrett's    GERD (gastroesophageal reflux disease)    Headache    History of bladder cancer    Bladder cancer 8 times   History of hiatal hernia    Hyperlipidemia    Hypertension    Localized osteoarthrosis, lower leg    Myocardial infarction (HCC) 2021   Pneumonia    Restless leg syndrome    Sleep apnea    does not wear cpap   Stenosis of esophagus     Tobacco History: Social History   Tobacco Use  Smoking Status Former   Current packs/day: 0.00   Average packs/day: 0.5 packs/day for 5.0 years (2.5 ttl pk-yrs)   Types: Cigarettes   Start date: 03/31/1971   Quit date: 03/30/1976   Years since quitting: 47.7  Smokeless  Tobacco Never   Counseling given: Not Answered   Outpatient Medications Prior to Visit  Medication Sig Dispense Refill   acetaminophen  (TYLENOL ) 500 MG tablet Take 1,000 mg by mouth daily as needed for moderate pain (pain score 4-6) or mild pain (pain score 1-3).     albuterol  (VENTOLIN  HFA) 108 (90 Base) MCG/ACT inhaler Inhale 2 puffs into the lungs every 6 (six) hours as needed for wheezing or shortness of breath. 1 each 6   apixaban  (ELIQUIS ) 2.5 MG TABS tablet Take 1 tablet (2.5 mg total) by mouth 2 (two) times daily. 180 tablet 0   clopidogrel  (PLAVIX ) 75 MG tablet TAKE 1 TABLET BY MOUTH ONCE  DAILY 100 tablet 2   Coenzyme Q10 (CO Q-10) 300 MG CAPS Take 300 mg by mouth every evening.     cyanocobalamin  (VITAMIN B12) 1000 MCG/ML injection Inject 1 mL (1,000 mcg total) into the muscle every 30 (thirty) days. 6 mL 11   cyclobenzaprine  (FLEXERIL ) 5 MG tablet Take 1 tablet (5 mg total) by mouth at bedtime as needed for muscle spasms. 30 tablet 0   ezetimibe  (ZETIA ) 10 MG tablet TAKE 1 TABLET BY MOUTH DAILY 100 tablet 1   ferrous sulfate 325 (65 FE) MG EC tablet Take 325 mg by mouth daily.     fluticasone  (FLONASE ) 50 MCG/ACT nasal spray Place 1  spray into both nostrils daily.     Fluticasone -Umeclidin-Vilant (TRELEGY ELLIPTA ) 200-62.5-25 MCG/ACT AEPB Inhale 1 puff into the lungs daily. 60 each 11   furosemide  (LASIX ) 20 MG tablet Take 1 tablet (20 mg total) by mouth every other day. (Patient taking differently: Take 20 mg by mouth as needed for edema or fluid.) 15 tablet 0   isosorbide  mononitrate (IMDUR ) 30 MG 24 hr tablet Take 1 tablet (30 mg total) by mouth daily. 90 tablet 0   Menthol, Topical Analgesic, (BENGAY EX) Apply 1 application topically daily as needed (pain).     Multiple Vitamin (MULTIVITAMIN) tablet Take 1 tablet by mouth daily.     nebivolol  (BYSTOLIC ) 10 MG tablet Take 0.5 tablets (5 mg total) by mouth daily. 45 tablet 0   nitroGLYCERIN  (NITROSTAT ) 0.4 MG SL tablet PLACE 1  TABLET UNDER THE TONGUE EVERY 5 MINUTES AS NEEDED FOR CHEST PAIN 25 tablet 0   Nystatin (GERHARDT'S BUTT CREAM) CREA Apply 1 Application topically daily. 60 each 0   oxyCODONE -acetaminophen  (PERCOCET) 10-325 MG tablet Take 1 tablet by mouth 2 (two) times daily. 60 tablet 0   pantoprazole  (PROTONIX ) 40 MG tablet Take 1 tablet (40 mg total) by mouth daily. 100 tablet 3   rOPINIRole  (REQUIP ) 2 MG tablet TAKE 1 TABLET BY MOUTH AT  BEDTIME 100 tablet 1   rosuvastatin  (CRESTOR ) 10 MG tablet Take 1 tablet (10 mg total) by mouth daily. 90 tablet 0   saccharomyces boulardii (FLORASTOR) 250 MG capsule Take 250 mg by mouth every evening.     sodium zirconium cyclosilicate  (LOKELMA ) 5 g packet Take 5 g by mouth daily.     venlafaxine  (EFFEXOR ) 100 MG tablet Take 1 tablet (100 mg total) by mouth daily. 90 tablet 1   amLODipine  (NORVASC ) 5 MG tablet TAKE 1 TABLET BY MOUTH DAILY (Patient not taking: Reported on 11/30/2023) 100 tablet 1   oxyCODONE -acetaminophen  (PERCOCET) 10-325 MG tablet Take 1 tablet by mouth 2 (two) times daily. (Patient not taking: Reported on 11/30/2023) 60 tablet 0   oxyCODONE -acetaminophen  (PERCOCET) 10-325 MG tablet Take 1 tablet by mouth 2 (two) times daily. (Patient not taking: Reported on 11/30/2023) 60 tablet 0   No facility-administered medications prior to visit.     Review of Systems:   Constitutional:   No  weight loss, night sweats,  Fevers, chills, +fatigue, or  lassitude.  HEENT:   No headaches,  Difficulty swallowing,  Tooth/dental problems, or  Sore throat,                No sneezing, itching, ear ache, nasal congestion, post nasal drip,   CV:  No chest pain,  Orthopnea, PND, swelling in lower extremities, anasarca, dizziness, palpitations, syncope.   GI  No heartburn, indigestion, abdominal pain, nausea, vomiting, diarrhea, change in bowel habits, loss of appetite, bloody stools.   Resp:   No chest wall deformity  Skin: no rash or lesions.  GU: no dysuria,  change in color of urine, no urgency or frequency.  No flank pain, no hematuria   MS:  No joint pain or swelling.  No decreased range of motion.  No back pain.    Physical Exam  BP 130/66   Pulse 67   Ht 5' 9 (1.753 m) Comment: Per pt  Wt 147 lb (66.7 kg)   SpO2 97% Comment: RA  BMI 21.71 kg/m   GEN: A/Ox3; pleasant , NAD, well nourished , elderly    HEENT:  [place]/AT,   , NOSE-clear, THROAT-clear,  no lesions, no postnasal drip or exudate noted.   NECK:  Supple w/ fair ROM; no JVD; normal carotid impulses w/o bruits; no thyromegaly or nodules palpated; no lymphadenopathy.    RESP  Clear  P & A; w/o, wheezes/ rales/ or rhonchi. no accessory muscle use, no dullness to percussion  CARD:  RRR, no m/r/g, no peripheral edema, pulses intact, no cyanosis or clubbing.  GI:   Soft & nt; nml bowel sounds; no organomegaly or masses detected.   Musco: Warm bil, no deformities or joint swelling noted.   Neuro: alert, no focal deficits noted.    Skin: Warm, no lesions or rashes    Lab Results:Reviewed 11/30/2023   CBC    Component Value Date/Time   WBC 10.8 (H) 09/16/2023 1202   RBC 3.88 (L) 09/16/2023 1202   HGB 11.0 (L) 09/16/2023 1202   HGB 13.7 12/09/2020 0853   HGB 13.0 04/25/2007 1127   HCT 33.4 (L) 09/16/2023 1202   HCT 40.5 12/09/2020 0853   HCT 37.0 (L) 04/25/2007 1127   PLT 315.0 09/16/2023 1202   PLT 161 12/09/2020 0853   MCV 86.2 09/16/2023 1202   MCV 87 12/09/2020 0853   MCV 89.0 04/25/2007 1127   MCH 28.2 09/14/2023 0229   MCHC 33.0 09/16/2023 1202   RDW 15.1 09/16/2023 1202   RDW 13.0 12/09/2020 0853   RDW 13.8 04/25/2007 1127   LYMPHSABS 2.1 09/16/2023 1202   LYMPHSABS 2.5 04/25/2007 1127   MONOABS 1.0 09/16/2023 1202   MONOABS 0.6 04/25/2007 1127   EOSABS 0.3 09/16/2023 1202   EOSABS 0.0 04/25/2007 1127   BASOSABS 0.1 09/16/2023 1202   BASOSABS 0.0 04/25/2007 1127    BMET    Component Value Date/Time   NA 143 09/16/2023 1202   NA 143 08/18/2023  0000   K 5.3 No hemolysis seen (H) 09/16/2023 1202   CL 100 09/16/2023 1202   CO2 31 09/16/2023 1202   GLUCOSE 148 (H) 09/16/2023 1202   BUN 66 (H) 09/16/2023 1202   BUN 38 (A) 08/18/2023 0000   CREATININE 3.36 (H) 09/16/2023 1202   CREATININE 3.35 (H) 07/05/2023 1232   CALCIUM  9.4 09/16/2023 1202   GFRNONAA 22 (L) 09/14/2023 0229   GFRAA 47 (L) 11/13/2019 1006    BNP    Component Value Date/Time   BNP 190.9 (H) 05/10/2019 1634   BNP 1,392.1 (H) 01/04/2019 1327    ProBNP    Component Value Date/Time   PROBNP 7,410 (H) 10/19/2023 1016   PROBNP 150.0 (H) 07/27/2018 1618    Imaging: VAS US  LOWER EXTREMITY ARTERIAL DUPLEX Result Date: 11/24/2023 LOWER EXTREMITY ARTERIAL DUPLEX STUDY Patient Name:  RAS KOLLMAN  Date of Exam:   11/24/2023 Medical Rec #: [id number]          Accession #:    [id number] Date of Birth: 01-15-1941          Patient Gender: M Patient Age:   74 years Exam Location:  [address] Procedure:      LE ARTERIAL Referring Phys: MIRIAM CAMPBELL --------------------------------------------------------------------------------  Indications: Pain. High Risk Factors: Hypertension, hyperlipidemia, Diabetes, coronary artery                    disease.  Current ABI: R 1.08 L 0.99 Performing Technologist: Duwaine Hives RVS  Examination Guidelines: A complete evaluation includes B-mode imaging, spectral Doppler, color Doppler, and power Doppler as needed of all accessible portions of each vessel. Bilateral testing is considered an integral part  of a complete examination. Limited examinations for reoccurring indications may be performed as noted.  +-----------+--------+-----+---------------+--------+--------+ RIGHT      PSV cm/sRatioStenosis       WaveformComments +-----------+--------+-----+---------------+--------+--------+ CFA Distal 120                         biphasic         +-----------+--------+-----+---------------+--------+--------+ DFA        168                          biphasic         +-----------+--------+-----+---------------+--------+--------+ SFA Prox   71                          biphasic         +-----------+--------+-----+---------------+--------+--------+ SFA Mid    311          50-74% stenosisbiphasic         +-----------+--------+-----+---------------+--------+--------+ SFA Distal 80                          biphasic         +-----------+--------+-----+---------------+--------+--------+ POP Prox   69                          biphasic         +-----------+--------+-----+---------------+--------+--------+ POP Distal 61                          biphasic         +-----------+--------+-----+---------------+--------+--------+ TP Trunk   62                          biphasic         +-----------+--------+-----+---------------+--------+--------+ ATA Distal 83                          biphasic         +-----------+--------+-----+---------------+--------+--------+ PTA Distal 17                          biphasic         +-----------+--------+-----+---------------+--------+--------+ PERO Distal52                          biphasic         +-----------+--------+-----+---------------+--------+--------+  +-----------+--------+-----+---------------+--------+--------+ LEFT       PSV cm/sRatioStenosis       WaveformComments +-----------+--------+-----+---------------+--------+--------+ CFA Distal 103                         biphasic         +-----------+--------+-----+---------------+--------+--------+ DFA        100                         biphasic         +-----------+--------+-----+---------------+--------+--------+ SFA Prox   74                          biphasic         +-----------+--------+-----+---------------+--------+--------+ SFA Mid    269  50-74% stenosisbiphasic         +-----------+--------+-----+---------------+--------+--------+ SFA Distal 83                           biphasic         +-----------+--------+-----+---------------+--------+--------+ POP Prox   103                         biphasic         +-----------+--------+-----+---------------+--------+--------+ POP Distal 114                         biphasic         +-----------+--------+-----+---------------+--------+--------+ TP Trunk   126                         biphasic         +-----------+--------+-----+---------------+--------+--------+ ATA Distal 60                          biphasic         +-----------+--------+-----+---------------+--------+--------+ PTA Distal 58                          biphasic         +-----------+--------+-----+---------------+--------+--------+ PERO Distal51                          biphasic         +-----------+--------+-----+---------------+--------+--------+  Summary: Right: 50-74% stenosis noted in the superficial femoral artery. Left: 50-74% stenosis noted in the superficial femoral artery.  See table(s) above for measurements and observations. Electronically signed by Maude Emmer MD on 11/24/2023 at 5:32:57 PM.    Final    VAS US  ABI WITH/WO TBI Result Date: 11/11/2023  LOWER EXTREMITY DOPPLER STUDY Patient Name:  TADHG ESKEW  Date of Exam:   11/10/2023 Medical Rec #: [id number]          Accession #:    [id number] Date of Birth: 09-25-1940          Patient Gender: M Patient Age:   5 years Exam Location:  [address] Procedure:      VAS US  ABI WITH/WO TBI Referring Phys: MIRIAM CAMPBELL --------------------------------------------------------------------------------  Indications: NP note 10/19/23: Bilateral lower extremity muscular pain present              from hips to feet.              Feet warm and dry, bilateral DP/PT pulses difficult to palpate.              Will order ABIs. High Risk Factors: Hypertension, Diabetes, past history of smoking.  Performing Technologist: [place] RVT  Examination  Guidelines: A complete evaluation includes at minimum, Doppler waveform signals and systolic blood pressure reading at the level of bilateral brachial, anterior tibial, and posterior tibial arteries, when vessel segments are accessible. Bilateral testing is considered an integral part of a complete examination. Photoelectric Plethysmograph (PPG) waveforms and toe systolic pressure readings are included as required and additional duplex testing as needed. Limited examinations for reoccurring indications may be performed as noted.  ABI Findings: +---------+------------------+-----+---------+--------+ Right    Rt Pressure (mmHg)IndexWaveform Comment  +---------+------------------+-----+---------+--------+ Brachial 157                                      +---------+------------------+-----+---------+--------+  ATA      169               1.08 triphasic         +---------+------------------+-----+---------+--------+ PTA      162               1.03 biphasic          +---------+------------------+-----+---------+--------+ Great Toe130               0.83                   +---------+------------------+-----+---------+--------+ +---------+------------------+-----+---------+-------+ Left     Lt Pressure (mmHg)IndexWaveform Comment +---------+------------------+-----+---------+-------+ Brachial 152                                     +---------+------------------+-----+---------+-------+ ATA      155               0.99 triphasic        +---------+------------------+-----+---------+-------+ PTA      152               0.97 biphasic         +---------+------------------+-----+---------+-------+ Great Toe114               0.73                  +---------+------------------+-----+---------+-------+ +-------+-----------+-----------+------------+------------+ ABI/TBIToday's ABIToday's TBIPrevious ABIPrevious TBI  +-------+-----------+-----------+------------+------------+ Right  1.08       0.83                                +-------+-----------+-----------+------------+------------+ Left   0.99       0.73                                +-------+-----------+-----------+------------+------------+  No previous ABI.  Summary: Right: Resting right ankle-brachial index is within normal range. The right toe-brachial index is normal.  Left: Resting left ankle-brachial index indicates noncompressible left lower extremity arteries. The left toe-brachial index is normal.  *See table(s) above for measurements and observations.  Electronically signed by Dorn Ross MD on 11/11/2023 at 1:24:52 PM.    Final     Administration History     None          Latest Ref Rng & Units 08/11/2023    8:52 AM  PFT Results  FVC-Pre L 2.03   FVC-Predicted Pre % 88   FVC-Post L 2.18   FVC-Predicted Post % 95   Pre FEV1/FVC % % 86   Post FEV1/FCV % % 84   FEV1-Pre L 1.74   FEV1-Predicted Pre % 113   FEV1-Post L 1.84   DLCO uncorrected ml/min/mmHg 13.95   DLCO UNC% % 83   DLCO corrected ml/min/mmHg 15.43   DLCO COR %Predicted % 91   DLVA Predicted % 133   TLC L 4.47   TLC % Predicted % 92   RV % Predicted % 125     No results found for: NITRICOXIDE      No data to display              Assessment & Plan:   Assessment and Plan Assessment & Plan Persistent dyspnea following recent pneumonia and heart failure exacerbation   Persistent dyspnea is likely due to residual  effects of multiple comorbidities including heart failure, chronic renal disease, deconditioning .Pulmonary function tests in July were normal, ruling out COPD .  No significant oxygen desaturations are noted.  Recurrent pneumonia.  Review of previous CT scans have showed multifocal bilateral ground glass densities and airspace opacities.  Concern for possible recurrent aspiration pneumonia.  Will order high-resolution CT chest  to further assess.  Aspiration precautions were discussed in detail.  Asthma, stable on current inhaler therapy   Asthma is well-controlled with Trelegy inhaler. Pulmonary function tests show normal lung function with no obstruction or restriction. Continue Trelegy inhaler.  On return visit if breathing is stable consider de-escalating to ICS/LABA combo only.  May need to change out LAMA if ophthalmology indicates affecting his vision.  Chronic kidney disease, stable with recent improvement in labs per patient report. Chronic kidney disease is stable with recent improvement in labs. . Continue follow-up with nephrology.  Atrial fibrillation, history of, currently in normal sinus rhythm   Currently in normal sinus rhythm.  Continue on current regimen and follow-up with cardiology  Heart failure, improved since hospitalization   Heart failure has improved since hospitalization. Weight monitoring is ongoing to prevent fluid overload. Continue follow-up with cardiology.  Peripheral artery disease -notable on recent arterial Dopplers follow-up with vascular as recommended   Protein calorie malnutrition.  Encouraged on a high-protein diet.  Unintentional weight loss and muscle wasting   Likely due to recent illness and chronic conditions. Emphasis on high protein diet to regain muscle mass and improve energy levels. Encouraged high protein diet and protein shakes. Instructed to monitor weight and muscle mass.  Chronic pain due to neck and back pain    History of multiple back surgeries and procedures. Continue current pain management regimen.  Use caution with sedating medications  Esophageal dysmotility with history of esophageal dilation and aspiration risk   Esophageal dysmotility with aspiration risk. Previous esophageal dilations were not effective. Advised to eat upright and avoid certain foods to prevent aspiration. Continue dietary modifications to prevent aspiration.  Vision impairment,  suspected cataracts and macular degeneration, pending ophthalmology evaluation   Vision impairment suspected to be due to cataracts and macular degeneration. Scheduled for ophthalmology evaluation. Potential impact of medications on vision discussed. Attend ophthalmology appointment for evaluation. Discuss potential medication impact on vision with ophthalmologist.        Madelin Stank, NP 11/30/2023  I spent  36 minutes dedicated to the care of this patient on the date of this encounter to include pre-visit review of records, face-to-face time with the patient discussing conditions above, post visit ordering of testing, clinical documentation with the electronic health record, making appropriate referrals as documented, and communicating necessary findings to members of the patients care team.

## 2023-12-01 ENCOUNTER — Other Ambulatory Visit: Payer: Self-pay | Admitting: Cardiovascular Disease

## 2023-12-01 MED ORDER — CLOPIDOGREL BISULFATE 75 MG PO TABS: 75.0000 mg | ORAL_TABLET | Freq: Every day | ORAL | 3 refills | Status: AC

## 2023-12-03 ENCOUNTER — Other Ambulatory Visit: Payer: Self-pay | Admitting: Cardiovascular Disease

## 2023-12-06 ENCOUNTER — Encounter: Payer: Self-pay | Admitting: Cardiovascular Disease

## 2023-12-06 ENCOUNTER — Ambulatory Visit: Attending: Cardiovascular Disease | Admitting: Cardiovascular Disease

## 2023-12-06 VITALS — BP 112/58 | HR 66 | Ht 69.0 in | Wt 149.0 lb

## 2023-12-06 DIAGNOSIS — I739 Peripheral vascular disease, unspecified: Secondary | ICD-10-CM | POA: Insufficient documentation

## 2023-12-06 MED ORDER — ISOSORBIDE MONONITRATE ER 30 MG PO TB24: 30.0000 mg | ORAL_TABLET | Freq: Every day | ORAL | 3 refills | Status: AC

## 2023-12-06 NOTE — Progress Notes (Signed)
 12/06/2023 Nancyann LELON Herring   May 24, 1940  [id number]  Primary Physician Theophilus Andrews, Tully GRADE, MD Primary Cardiologist: Dorn JINNY Lesches MD [facility] [place], [place]  HPI:  PEARLY APACHITO is a 83 y.o. thin-appearing widowed Caucasian male father of 3 children, grandfather of 6 grandchildren who is accompanied by one of his daughters Leeroy today.  He was referred by Lynda Shams, PA-C for evaluation of PAD.  He is a former patient of Dr. Joesphine.  He underwent CABG x 4 in 2001 presented probs include history of hypertension and hyperlipidemia.  He had a non-STEMI 01/02/2019 in the setting of cholecystitis underwent cardiac catheterization by Dr. Mady at that time he was found to have a subtotally occluded SVG to PDA/PLA.  He had 3 overlapping Onyx drug-eluting stents placed.  He currently denies chest pain but does get dyspnea on exertion.  He had abdominal aortogram performed by Dr. Serene 08/03/2023 for the evaluation of mesenteric ischemia using CO2.  Dr. Serene did image his distal abdominal aorta and iliac arteries and found to be widely patent.  He had Doppler studies performed in our office 12/04/2023 revealing normal ABIs bilaterally with mild to moderate bilateral mid SFA disease which I did not think was contributing to his symptoms which were characterized by pain in his back and hips.  He does have restless leg syndrome as well.   Current Meds  Medication Sig   acetaminophen  (TYLENOL ) 500 MG tablet Take 1,000 mg by mouth daily as needed for moderate pain (pain score 4-6) or mild pain (pain score 1-3).   albuterol  (VENTOLIN  HFA) 108 (90 Base) MCG/ACT inhaler Inhale 2 puffs into the lungs every 6 (six) hours as needed for wheezing or shortness of breath.   apixaban  (ELIQUIS ) 2.5 MG TABS tablet Take 1 tablet (2.5 mg total) by mouth 2 (two) times daily.   clopidogrel  (PLAVIX ) 75 MG tablet Take 1 tablet (75 mg total) by mouth daily.   Coenzyme Q10 (CO Q-10) 300 MG CAPS Take  300 mg by mouth every evening.   cyanocobalamin  (VITAMIN B12) 1000 MCG/ML injection Inject 1 mL (1,000 mcg total) into the muscle every 30 (thirty) days.   cyclobenzaprine  (FLEXERIL ) 5 MG tablet Take 1 tablet (5 mg total) by mouth at bedtime as needed for muscle spasms.   ezetimibe  (ZETIA ) 10 MG tablet TAKE 1 TABLET BY MOUTH DAILY   ferrous sulfate 325 (65 FE) MG EC tablet Take 325 mg by mouth daily.   fluticasone  (FLONASE ) 50 MCG/ACT nasal spray Place 1 spray into both nostrils daily.   Fluticasone -Umeclidin-Vilant (TRELEGY ELLIPTA ) 200-62.5-25 MCG/ACT AEPB Inhale 1 puff into the lungs daily.   furosemide  (LASIX ) 20 MG tablet Take 1 tablet (20 mg total) by mouth every other day. (Patient taking differently: Take 20 mg by mouth as needed for edema or fluid.)   isosorbide  mononitrate (IMDUR ) 30 MG 24 hr tablet Take 1 tablet (30 mg total) by mouth daily.   Menthol, Topical Analgesic, (BENGAY EX) Apply 1 application topically daily as needed (pain).   Multiple Vitamin (MULTIVITAMIN) tablet Take 1 tablet by mouth daily.   nebivolol  (BYSTOLIC ) 10 MG tablet Take 0.5 tablets (5 mg total) by mouth daily.   nitroGLYCERIN  (NITROSTAT ) 0.4 MG SL tablet PLACE 1 TABLET UNDER THE TONGUE EVERY 5 MINUTES AS NEEDED FOR CHEST PAIN   Nystatin (GERHARDT'S BUTT CREAM) CREA Apply 1 Application topically daily.   oxyCODONE -acetaminophen  (PERCOCET) 10-325 MG tablet Take 1 tablet by mouth 2 (two) times daily.  pantoprazole  (PROTONIX ) 40 MG tablet Take 1 tablet (40 mg total) by mouth daily.   rOPINIRole  (REQUIP ) 2 MG tablet TAKE 1 TABLET BY MOUTH AT  BEDTIME   rosuvastatin  (CRESTOR ) 10 MG tablet Take 1 tablet (10 mg total) by mouth daily.   saccharomyces boulardii (FLORASTOR) 250 MG capsule Take 250 mg by mouth every evening.   sodium zirconium cyclosilicate  (LOKELMA ) 5 g packet Take 5 g by mouth daily.   venlafaxine  (EFFEXOR ) 100 MG tablet TAKE 1 TABLET BY MOUTH DAILY     No Known Allergies  Social History    Socioeconomic History   Marital status: Widowed    Spouse name: Not on file   Number of children: 3   Years of education: Not on file   Highest education level: 12th grade  Occupational History   Occupation: Education Officer, Environmental ( part-time)  Tobacco Use   Smoking status: Former    Current packs/day: 0.00    Average packs/day: 0.5 packs/day for 5.0 years (2.5 ttl pk-yrs)    Types: Cigarettes    Start date: 03/31/1971    Quit date: 03/30/1976    Years since quitting: 47.7   Smokeless tobacco: Never  Vaping Use   Vaping status: Never Used  Substance and Sexual Activity   Alcohol use: No    Alcohol/week: 0.0 standard drinks of alcohol   Drug use: No   Sexual activity: Yes  Other Topics Concern   Not on file  Social History Narrative   Not on file   Social Drivers of Health   Financial Resource Strain: Low Risk  (09/09/2023)   Overall Financial Resource Strain (CARDIA)    Difficulty of Paying Living Expenses: Not very hard  Food Insecurity: No Food Insecurity (09/15/2023)   Hunger Vital Sign    Worried About Running Out of Food in the Last Year: Never true    Ran Out of Food in the Last Year: Never true  Transportation Needs: No Transportation Needs (09/15/2023)   PRAPARE - Administrator, Civil Service (Medical): No    Lack of Transportation (Non-Medical): No  Physical Activity: Unknown (02/10/2023)   Exercise Vital Sign    Days of Exercise per Week: 0 days    Minutes of Exercise per Session: Not on file  Recent Concern: Physical Activity - Inactive (02/10/2023)   Exercise Vital Sign    Days of Exercise per Week: 0 days    Minutes of Exercise per Session: 30 min  Stress: No Stress Concern Present (02/10/2023)   [employer] of Occupational Health - Occupational Stress Questionnaire    Feeling of Stress : Not at all  Social Connections: Moderately Integrated (07/08/2023)   Social Connection and Isolation Panel    Frequency of Communication with Friends and Family: More  than three times a week    Frequency of Social Gatherings with Friends and Family: Twice a week    Attends Religious Services: More than 4 times per year    Active Member of [employer] or Organizations: Yes    Attends Banker Meetings: More than 4 times per year    Marital Status: Widowed  Intimate Partner Violence: Not At Risk (07/08/2023)   Humiliation, Afraid, Rape, and Kick questionnaire    Fear of Current or Ex-Partner: No    Emotionally Abused: No    Physically Abused: No    Sexually Abused: No     Review of Systems: General: negative for chills, fever, night sweats or weight changes.  Cardiovascular: negative  for chest pain, dyspnea on exertion, edema, orthopnea, palpitations, paroxysmal nocturnal dyspnea or shortness of breath Dermatological: negative for rash Respiratory: negative for cough or wheezing Urologic: negative for hematuria Abdominal: negative for nausea, vomiting, diarrhea, bright red blood per rectum, melena, or hematemesis Neurologic: negative for visual changes, syncope, or dizziness All other systems reviewed and are otherwise negative except as noted above.    Blood pressure (!) 112/58, pulse 66, height 5' 9 (1.753 m), weight 149 lb (67.6 kg), SpO2 95%.  General appearance: alert and no distress Neck: no adenopathy, no carotid bruit, no JVD, supple, symmetrical, trachea midline, and thyroid  not enlarged, symmetric, no tenderness/mass/nodules Lungs: clear to auscultation bilaterally Heart: regular rate and rhythm, S1, S2 normal, no murmur, click, rub or gallop Extremities: extremities normal, atraumatic, no cyanosis or edema Pulses: 2+ and symmetric Skin: Skin color, texture, turgor normal. No rashes or lesions Neurologic: Grossly normal  EKG not performed today      ASSESSMENT AND PLAN:   Peripheral arterial disease Mr. Shughart was referred to me by Lynda Shams, PA-C for peripheral vascular valuation.  He is previously a patient of Dr.  Joesphine and is scheduled to see Dr. Francyne in December.  He did have a peripheral angiogram performed by Dr. Serene 08/03/2023 for evaluation of mesenteric ischemia although his SMA was patent.  Dr. Serene also did an angiogram of the iliac arteries which were widely patent.  Patient has history of back surgery and has pain at night when in bed and also when standing.  He really denies claudication in his calves.  He had Doppler studies performed 11/24/2023 revealing essentially normal ABIs with mild to moderate disease in his mid right SFA and mid left SFA with biphasic waveforms.  I do not think his symptoms sound vascular and I do not think his SFA disease is contributing to any of his symptoms.  I recommended conservative therapy at this time.     Dorn DOROTHA Lesches MD FACP,FACC,FAHA, [facility] 12/06/2023 8:52 AM

## 2023-12-06 NOTE — Assessment & Plan Note (Signed)
 [name] was referred to me by Lynda Shams, PA-C for peripheral vascular valuation.  He is previously a patient of Dr. Joesphine and is scheduled to see Dr. Francyne in December.  He did have a peripheral angiogram performed by Dr. Serene 08/03/2023 for evaluation of mesenteric ischemia although his SMA was patent.  Dr. Serene also did an angiogram of the iliac arteries which were widely patent.  Patient has history of back surgery and has pain at night when in bed and also when standing.  He really denies claudication in his calves.  He had Doppler studies performed 11/24/2023 revealing essentially normal ABIs with mild to moderate disease in his mid right SFA and mid left SFA with biphasic waveforms.  I do not think his symptoms sound vascular and I do not think his SFA disease is contributing to any of his symptoms.  I recommended conservative therapy at this time.

## 2023-12-06 NOTE — Patient Instructions (Addendum)

## 2023-12-07 ENCOUNTER — Ambulatory Visit (HOSPITAL_BASED_OUTPATIENT_CLINIC_OR_DEPARTMENT_OTHER)
Admission: RE | Admit: 2023-12-07 | Discharge: 2023-12-07 | Disposition: A | Source: Ambulatory Visit | Attending: Adult Health | Admitting: Adult Health

## 2023-12-07 ENCOUNTER — Other Ambulatory Visit (HOSPITAL_COMMUNITY)

## 2023-12-07 ENCOUNTER — Encounter (HOSPITAL_COMMUNITY): Payer: Self-pay

## 2023-12-07 ENCOUNTER — Ambulatory Visit (HOSPITAL_COMMUNITY)
Admission: RE | Admit: 2023-12-07 | Discharge: 2023-12-07 | Disposition: A | Discharge status: Home / Self Care | Source: Ambulatory Visit | Attending: Adult Health | Admitting: Adult Health

## 2023-12-07 DIAGNOSIS — J189 Pneumonia, unspecified organism: Secondary | ICD-10-CM | POA: Insufficient documentation

## 2023-12-07 DIAGNOSIS — A419 Sepsis, unspecified organism: Secondary | ICD-10-CM | POA: Diagnosis present

## 2023-12-13 ENCOUNTER — Encounter: Payer: Self-pay | Admitting: Cardiovascular Disease

## 2023-12-13 ENCOUNTER — Other Ambulatory Visit: Payer: Self-pay | Admitting: Cardiovascular Disease

## 2023-12-13 MED ORDER — APIXABAN 2.5 MG PO TABS: 2.5000 mg | ORAL_TABLET | Freq: Two times a day (BID) | ORAL | 1 refills | Status: DC

## 2023-12-13 NOTE — Telephone Encounter (Signed)
 Looks like he is seeing Dr. Dorn Lesches now. Please notify the family that had this point, due to age 83 and creatinine greater than 1.5 the appropriate dose of Eliquis  is 2.5 mg twice daily.  Please refill at that dose.

## 2023-12-13 NOTE — Telephone Encounter (Signed)
 Prescription refill request for Eliquis  received. Indication: A flutter/possible CV Last office visit: 12/06/2023 Scr: 3.36 Age: 83 Weight: 67.6kg  Dose appropriate forwarding to MD and pharmacy for further advisement d/t increased creatinine and clarification of indication

## 2023-12-14 ENCOUNTER — Ambulatory Visit: Payer: Self-pay | Admitting: Adult Health

## 2023-12-14 DIAGNOSIS — R918 Other nonspecific abnormal finding of lung field: Secondary | ICD-10-CM

## 2023-12-14 NOTE — Progress Notes (Signed)
ATC x1.  LVM to return call. 

## 2023-12-15 ENCOUNTER — Other Ambulatory Visit: Payer: Self-pay | Admitting: Cardiovascular Disease

## 2023-12-15 MED ORDER — APIXABAN 2.5 MG PO TABS: 2.5000 mg | ORAL_TABLET | Freq: Two times a day (BID) | ORAL | 2 refills | Status: AC

## 2023-12-15 NOTE — Progress Notes (Signed)
 I called and spoke with patient's daughter, Leeroy ([email or web address]), provided results/recommendations per Madelin Stank NP.  She verbalized understanding.  F/u with Dr. Annella set up as well as spiro/DLCO.  Nothing further needed.  Leeroy asked that I call the patient as well.  I called and spoke with patient and provided results/recommendations per Madelin Stank NP and advised I scheduled his f/u with Dr. Annella.  He verbalized understanding.  Nothing further needed.

## 2023-12-15 NOTE — Telephone Encounter (Signed)
 Eliquis  2.5mg  refill request received. Patient is 83 years old, weight-67.6kg, Crea-3.36 on 09/16/23, Diagnosis-Aflutter, and last seen by Dr. Court on 12/06/23. Dose is appropriate based on dosing criteria. Will send in refill to requested pharmacy.

## 2023-12-28 ENCOUNTER — Other Ambulatory Visit: Payer: Self-pay | Admitting: Internal Medicine

## 2024-01-04 ENCOUNTER — Telehealth (HOSPITAL_BASED_OUTPATIENT_CLINIC_OR_DEPARTMENT_OTHER): Payer: Self-pay

## 2024-01-04 NOTE — Telephone Encounter (Addendum)
° °  Pre-operative Risk Assessment    Patient Name: [name]  DOB: [birth year] MRN: [id number]   Date of last office visit: 12/06/23 with Court Date of next office visit: 01/18/24 with Croitoru  Request for Surgical Clearance    Procedure:  selective nerve root block (PROC) right C4 and C5 snrb  Date of Surgery:  Clearance TBD                                 Surgeon:  Dr. Bonner Surgeon's Group or Practice Name:  [facility] Phone number:  [phone] Fax number:  [phone]   Type of Clearance Requested:   - Medical  - Pharmacy:  Hold Clopidogrel  (Plavix ) for 7 days and Eliquis  for 3 days    Type of Anesthesia:  Not Indicated   Additional requests/questions:    [name]   01/04/2024, 5:05 PM

## 2024-01-05 ENCOUNTER — Ambulatory Visit: Admitting: Pulmonary Disease

## 2024-01-05 ENCOUNTER — Ambulatory Visit (INDEPENDENT_AMBULATORY_CARE_PROVIDER_SITE_OTHER)

## 2024-01-05 ENCOUNTER — Other Ambulatory Visit: Payer: Self-pay

## 2024-01-05 ENCOUNTER — Encounter: Payer: Self-pay | Admitting: Pulmonary Disease

## 2024-01-05 ENCOUNTER — Telehealth (HOSPITAL_BASED_OUTPATIENT_CLINIC_OR_DEPARTMENT_OTHER): Payer: Self-pay

## 2024-01-05 VITALS — BP 142/74 | HR 85 | Ht 69.0 in | Wt 148.2 lb

## 2024-01-05 DIAGNOSIS — J4540 Moderate persistent asthma, uncomplicated: Secondary | ICD-10-CM

## 2024-01-05 DIAGNOSIS — R0609 Other forms of dyspnea: Secondary | ICD-10-CM

## 2024-01-05 DIAGNOSIS — Z87891 Personal history of nicotine dependence: Secondary | ICD-10-CM

## 2024-01-05 DIAGNOSIS — J45909 Unspecified asthma, uncomplicated: Secondary | ICD-10-CM | POA: Diagnosis not present

## 2024-01-05 DIAGNOSIS — J849 Interstitial pulmonary disease, unspecified: Secondary | ICD-10-CM | POA: Diagnosis not present

## 2024-01-05 LAB — PULMONARY FUNCTION TEST
DL/VA % pred: 95 %
DL/VA: 3.91 ml/min/mmHg/L
DLCO unc % pred: 102 %
DLCO unc: 17.17 ml/min/mmHg
FEF 25-75 Pre: 2.69 L/s
FEF2575-%Pred-Pre: 274 %
FEV1-%Pred-Pre: 121 %
FEV1-Pre: 1.86 L
FEV1FVC-%Pred-Pre: 127 %
FEV6-%Pred-Pre: 99 %
FEV6-Pre: 2.04 L
FEV6FVC-%Pred-Pre: 111 %
FVC-%Pred-Pre: 89 %
FVC-Pre: 2.04 L
Pre FEV1/FVC ratio: 91 %
Pre FEV6/FVC Ratio: 100 %

## 2024-01-05 NOTE — Progress Notes (Signed)
Spiro/DLCO performed today. 

## 2024-01-05 NOTE — Progress Notes (Signed)
 @Patient  ID: [name], male    DOB: 1940-04-13, 83 y.o.   MRN: [id number]  Chief Complaint  Patient presents with   Medical Management of Chronic Issues    Post PFT     Referring provider: Theophilus Andrews, Tully GRADE, MD  HPI:   83 y.o. 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  Most recent note via pulmonary NP x 2 reviewed.  Doing well.  Remains much improved on Trelegy.  Unfortunate, hospitalized again 8/25 with pneumonia.  CT high-resolution obtained in interim shows mild peripheral interstitial lobular thickening really throughout all my interpretation.  Compared to 08/2023 no real change.  Reviewed CT scan in 2020 that showed significant infiltrates throughout the lungs.  We discussed I suspect these changes are more related to postinflammatory scarring.  Given mild appearance etc.  We discussed treatment of fibrotic NSIP if that was the case.  Based on his weight, other comorbidities, additional antifibrotic's are felt not to be beneficial, more harmful.  He had PFTs performed today.  Compared to PFT 07/2023 prior to most recent hospitalization these are unchanged, overall values are essentially exactly the same, nearly identical.  He states he feels the best he is having all the time.  HPI initial visit: Patient states has been short of breath his whole life.  Had breathing issues his whole life.  As a child in his adult.  I guess he just longer live with it.  Eventually he was diagnosed with significant CAD in 2002 and underwent multivessel CABG.  This did not improve his shortness of breath at all per his report.  He stated he could not keep up with his peers in cardiac rehab.  This is progressed over time.  He was seen  Questionaires / Pulmonary Flowsheets:   ACT:  Asthma Control Test ACT Total Score  11/30/2023  9:08 AM 12     MMRC:     No data to display          Epworth:      No data to display          Tests:   FENO:  No results found for: NITRICOXIDE  PFT:    Latest Ref Rng & Units 01/05/2024    9:20 AM 08/11/2023    8:52 AM  PFT Results  FVC-Pre L 2.04  P 2.03   FVC-Predicted Pre % 89  P 88   FVC-Post L  2.18   FVC-Predicted Post %  95   Pre FEV1/FVC % % 91  P 86   Post FEV1/FCV % %  84   FEV1-Pre L 1.86  P 1.74   FEV1-Predicted Pre % 121  P 113   FEV1-Post L  1.84   DLCO uncorrected ml/min/mmHg 17.17  P 13.95   DLCO UNC% % 102  P 83   DLCO corrected ml/min/mmHg  15.43   DLCO COR %Predicted %  91   DLVA Predicted % 95  P 133   TLC L  4.47   TLC % Predicted %  92   RV % Predicted %  125     P Preliminary result  Personally interpreted 12/2023 normal spirometry, DLCO within normal limits, 07/2023 normal spirometry, no bronchodilator response, lung volumes consistent with air trapping, DLCO within normal limits.  WALK:     11/02/2018  11:01 AM 09/07/2018   10:46 AM 07/27/2018    3:59 PM  SIX MIN WALK  Supplimental Oxygen during Test? (L/min) No No No  Tech Comments: average pace/min SOB//lmr Pt. did first lap slow pace and second lap fast pace. He did get sob on second lap. ER average to moderate pace/SOB//lmr    Imaging: Personally reviewed and as per EMR in discussion this note CT CHEST HIGH RESOLUTION Result Date: 12/10/2023 CLINICAL DATA:  Interstitial lung disease, recent community acquired pneumonia. Question aspiration. EXAM: CT CHEST WITHOUT CONTRAST TECHNIQUE: Multidetector CT imaging of the chest was performed following the standard protocol without intravenous contrast. High resolution imaging of the lungs, as well as inspiratory and expiratory imaging, was performed. RADIATION DOSE REDUCTION: This exam was performed according to the departmental dose-optimization program which includes automated exposure control, adjustment of the mA and/or kV according to  patient size and/or use of iterative reconstruction technique. COMPARISON:  09/07/2023. FINDINGS: Cardiovascular: Atherosclerotic calcification of the aorta, aortic valve and coronary arteries. Heart is enlarged. No pericardial effusion. Mediastinum/Nodes: No pathologically enlarged mediastinal or axillary lymph nodes. Hilar regions are difficult to definitively evaluate without IV contrast. Esophagus is grossly unremarkable. Lungs/Pleura: Centrilobular and paraseptal emphysema. Previously seen areas of peribronchovascular nodularity and consolidation have resolved. New somewhat ill-defined peribronchovascular nodularity in the right lower lobe. Scarring in the left upper and anterior left lower lobes. Mild subpleural reticulation and ground-glass with associated traction bronchiolectasis, upper and midlung zone predominant. New 6 mm medial left upper lobe nodule (12/34). Pleuroparenchymal scarring at the base of the left hemithorax. Postinfectious peribronchovascular nodularity and consolidation in the posterior segment right upper lobe and right middle lobe. Trace right pleural effusion. Airway is unremarkable. Mild air trapping. Upper Abdomen: Chronic pneumobilia. Right nephrectomy. Visualized portions of the liver, adrenal glands, kidneys, spleen, pancreas, stomach and bowel are otherwise grossly unremarkable. No upper abdominal adenopathy. Musculoskeletal: Degenerative changes in the spine. IMPRESSION: 1. Interval clearing of previously seen multi lobar pneumonia with scattered scarring. New peribronchovascular nodularity in the right lower lobe is indicative of an infectious bronchiolitis. 2. Mild upper and midlung zone predominant subpleural reticulation, ground-glass and bronchiolectasis, findings indicative of fibrotic nonspecific interstitial pneumonitis. Findings are suggestive of an alternative diagnosis (not UIP) per consensus guidelines: Diagnosis of Idiopathic Pulmonary Fibrosis: An Official  ATS/ERS/JRS/ALAT Clinical Practice Guideline. Am JINNY [employer] Crit Care Med Vol 198, Iss 5, [phone], Sep 19 2016. 3. New 6 mm left upper lobe nodule, likely infectious/inflammatory in etiology given its appearance in the short interval from 09/07/2023. This could be followed in 3-6 months, as clinically indicated. 4. Air trapping is indicative of small airways disease. 5. Aortic atherosclerosis (ICD10-I70.0). Coronary artery calcification. 6.  Emphysema (ICD10-J43.9). Electronically Signed   By: Newell Eke M.D.   On: 12/10/2023 08:57    Lab Results: Personally reviewed CBC    Component Value Date/Time   WBC 10.8 (H) 09/16/2023 1202   RBC 3.88 (L) 09/16/2023 1202   HGB 11.0 (L) 09/16/2023 1202   HGB 13.7 12/09/2020 0853   HGB 13.0 04/25/2007 1127   HCT 33.4 (L) 09/16/2023 1202   HCT 40.5 12/09/2020 0853   HCT 37.0 (L) 04/25/2007 1127   PLT 315.0 09/16/2023 1202   PLT 161 12/09/2020 0853   MCV 86.2 09/16/2023 1202   MCV 87 12/09/2020 0853   MCV 89.0 04/25/2007 1127   MCH 28.2 09/14/2023 0229   MCHC 33.0 09/16/2023 1202   RDW 15.1 09/16/2023 1202   RDW 13.0 12/09/2020 0853  RDW 13.8 04/25/2007 1127   LYMPHSABS 2.1 09/16/2023 1202   LYMPHSABS 2.5 04/25/2007 1127   MONOABS 1.0 09/16/2023 1202   MONOABS 0.6 04/25/2007 1127   EOSABS 0.3 09/16/2023 1202   EOSABS 0.0 04/25/2007 1127   BASOSABS 0.1 09/16/2023 1202   BASOSABS 0.0 04/25/2007 1127    BMET    Component Value Date/Time   NA 143 09/16/2023 1202   NA 143 08/18/2023 0000   K 5.3 No hemolysis seen (H) 09/16/2023 1202   CL 100 09/16/2023 1202   CO2 31 09/16/2023 1202   GLUCOSE 148 (H) 09/16/2023 1202   BUN 66 (H) 09/16/2023 1202   BUN 38 (A) 08/18/2023 0000   CREATININE 3.36 (H) 09/16/2023 1202   CREATININE 3.35 (H) 07/05/2023 1232   CALCIUM  9.4 09/16/2023 1202   GFRNONAA 22 (L) 09/14/2023 0229   GFRAA 47 (L) 11/13/2019 1006    BNP    Component Value Date/Time   BNP 190.9 (H) 05/10/2019 1634   BNP 1,392.1 (H)  01/04/2019 1327    ProBNP    Component Value Date/Time   PROBNP 7,410 (H) 10/19/2023 1016   PROBNP 150.0 (H) 07/27/2018 1618    Specialty Problems       Pulmonary Problems   Allergic rhinitis   Qualifier: Diagnosis of  By: Trudy, LPN, Kendell HERO       DOE (dyspnea on exertion)   Onset late 1990's  - Spirometry 10/22/09   FEV1 2.96 (86%)  With  fev1/fvc ratio 0.85 no graphics avail  - 07/27/2018   Walked RA  2 laps @  approx 264ft each @ avg pace  stopped due to  End of study, sob but not out of breath with sats still 94% -  D dimer 09/27/18 = 4.01 >>> perfusion lung scan 07/28/2018 > no evidence of pe - venous dopplers 07/29/18  done due to pos d dimer > neg bilaterally  - 09/07/2018   Walked RA  2 laps @  approx 250 ft each @ slow then fast pace  stopped due to  End of study, no sob and sats 94% at end of study       Pleural effusion on left   First noted 06/23/18 Abd ct f/u from pancreatitis -  Perfusion lung scan 07/28/18  neg for pe/ venous dopplers nl - L chest u/s  08/04/2018  No free effusion to tap - f/u cxr 11/02/2018 no change very small/ loculated L effsuion  - CT 01/04/2019  As dz //Pleura: Mild diffuse pleural thickening overlying the left Lung  - cxr 02/07/2019 residual scarring only, no further f/u needed        Upper airway cough syndrome   Onset as far back as he can remember with sense of nasal congestion and pnds, better p sinus surgery 2006 [facility]  - Allergy  profile  08/08/2018 >  Eos 0.3 /  IgE  258 RAST Pos dog > cat  - Sinus CT 09/14/2018 : Very mild mucosal thickening right maxillary sinus, anterior left ethmoid air cells and left frontal sinus. Status post partial ethmoidectomy and bilateral maxillary antrostomy.      Acute hypoxemic respiratory failure (HCC)   Acute respiratory failure (HCC)   Multifocal pneumonia   Sepsis due to pneumonia (HCC)    No Known Allergies  Immunization History  Administered Date(s) Administered   Fluad Quad(high Dose  65+) 11/02/2018, 11/09/2019, 10/16/2021   INFLUENZA, HIGH DOSE SEASONAL PF 10/19/2013, 10/22/2014, 09/30/2015, 10/26/2016, 11/19/2017, 11/01/2020, 11/04/2022, 09/28/2023  Influenza Split 10/01/2010, 10/16/2011   Influenza Whole 01/20/2004, 11/24/2006, 10/19/2008, 10/22/2009   Influenza,inj,Quad PF,6+ Mos 10/18/2012   Influenza,inj,quad, With Preservative 10/19/2016   Moderna Covid-19 Fall Seasonal Vaccine 36yrs & older 11/03/2023   Moderna Sars-Covid-2 Vaccination 03/12/2019, 04/03/2019, 12/29/2019, 10/22/2020   PNEUMOCOCCAL CONJUGATE-20 10/20/2023   Pfizer Covid-19 Vaccine Bivalent Booster 98yrs & up 10/22/2020   Pfizer(Comirnaty)Fall Seasonal Vaccine 12 years and older 11/19/2021   Pneumococcal Conjugate-13 05/29/2013   Pneumococcal Polysaccharide-23 10/22/2009   Respiratory Syncytial Virus Vaccine,Recomb Aduvanted(Arexvy) 11/16/2023   Tdap 11/24/2016   Tetanus 05/29/2013    Past Medical History:  Diagnosis Date   Allergy     Anxiety    B12 deficiency anemia    Blood transfusion without reported diagnosis    CAD (coronary artery disease)    Cancer (HCC)    bladder, Right renal cell also   Chronic kidney disease    Colon polyps    COPD (chronic obstructive pulmonary disease) (HCC)    Depression    Diabetes mellitus (HCC)    Dyspnea    Esophagus, Barrett's    GERD (gastroesophageal reflux disease)    Headache    History of bladder cancer    Bladder cancer 8 times   History of hiatal hernia    Hyperlipidemia    Hypertension    Localized osteoarthrosis, lower leg    Myocardial infarction (HCC) 2021   Pneumonia    Restless leg syndrome    Sleep apnea    does not wear cpap   Stenosis of esophagus     Tobacco History: Social History   Tobacco Use  Smoking Status Former   Current packs/day: 0.00   Average packs/day: 0.5 packs/day for 5.0 years (2.5 ttl pk-yrs)   Types: Cigarettes   Start date: 03/31/1971   Quit date: 03/30/1976   Years since quitting: 47.8   Smokeless Tobacco Never   Counseling given: Not Answered   Continue to not smoke  Outpatient Encounter Medications as of 01/05/2024  Medication Sig   acetaminophen  (TYLENOL ) 500 MG tablet Take 1,000 mg by mouth daily as needed for moderate pain (pain score 4-6) or mild pain (pain score 1-3).   albuterol  (VENTOLIN  HFA) 108 (90 Base) MCG/ACT inhaler Inhale 2 puffs into the lungs every 6 (six) hours as needed for wheezing or shortness of breath.   apixaban  (ELIQUIS ) 2.5 MG TABS tablet Take 1 tablet (2.5 mg total) by mouth 2 (two) times daily.   clopidogrel  (PLAVIX ) 75 MG tablet Take 1 tablet (75 mg total) by mouth daily.   Coenzyme Q10 (CO Q-10) 300 MG CAPS Take 300 mg by mouth every evening.   cyanocobalamin  (VITAMIN B12) 1000 MCG/ML injection Inject 1 mL (1,000 mcg total) into the muscle every 30 (thirty) days.   cyclobenzaprine  (FLEXERIL ) 5 MG tablet Take 1 tablet (5 mg total) by mouth at bedtime as needed for muscle spasms.   ezetimibe  (ZETIA ) 10 MG tablet TAKE 1 TABLET BY MOUTH DAILY   ferrous sulfate 325 (65 FE) MG EC tablet Take 325 mg by mouth daily.   fluticasone  (FLONASE ) 50 MCG/ACT nasal spray Place 1 spray into both nostrils daily.   Fluticasone -Umeclidin-Vilant (TRELEGY ELLIPTA ) 200-62.5-25 MCG/ACT AEPB Inhale 1 puff into the lungs daily.   furosemide  (LASIX ) 20 MG tablet Take 1 tablet (20 mg total) by mouth every other day. (Patient taking differently: Take 20 mg by mouth as needed for edema or fluid.)   isosorbide  mononitrate (IMDUR ) 30 MG 24 hr tablet Take 1 tablet (30 mg total)  by mouth daily.   Menthol, Topical Analgesic, (BENGAY EX) Apply 1 application topically daily as needed (pain).   Multiple Vitamin (MULTIVITAMIN) tablet Take 1 tablet by mouth daily.   nebivolol  (BYSTOLIC ) 10 MG tablet Take 0.5 tablets (5 mg total) by mouth daily.   nitroGLYCERIN  (NITROSTAT ) 0.4 MG SL tablet PLACE 1 TABLET UNDER THE TONGUE EVERY 5 MINUTES AS NEEDED FOR CHEST PAIN   Nystatin  (GERHARDT'S BUTT CREAM) CREA Apply 1 Application topically daily.   oxyCODONE -acetaminophen  (PERCOCET) 10-325 MG tablet Take 1 tablet by mouth 2 (two) times daily.   pantoprazole  (PROTONIX ) 40 MG tablet Take 1 tablet (40 mg total) by mouth daily.   rOPINIRole  (REQUIP ) 2 MG tablet TAKE 1 TABLET BY MOUTH AT  BEDTIME   rosuvastatin  (CRESTOR ) 10 MG tablet Take 1 tablet (10 mg total) by mouth daily.   saccharomyces boulardii (FLORASTOR) 250 MG capsule Take 250 mg by mouth every evening.   sodium zirconium cyclosilicate  (LOKELMA ) 5 g packet Take 5 g by mouth daily.   venlafaxine  (EFFEXOR ) 100 MG tablet TAKE 1 TABLET BY MOUTH DAILY   No facility-administered encounter medications on file as of 01/05/2024.     Review of Systems  Review of Systems  N/a Physical Exam  BP (!) 142/74   Pulse 85   Ht 5' 9 (1.753 m) Comment: per RRt notes  Wt 148 lb 3.2 oz (67.2 kg) Comment: per RRT notes  SpO2 96%   BMI 21.89 kg/m   Wt Readings from Last 5 Encounters:  01/05/24 148 lb 3.2 oz (67.2 kg)  12/06/23 149 lb (67.6 kg)  11/30/23 147 lb (66.7 kg)  10/20/23 153 lb 14.4 oz (69.8 kg)  10/19/23 153 lb 12.8 oz (69.8 kg)    BMI Readings from Last 5 Encounters:  01/05/24 21.89 kg/m  12/06/23 22.00 kg/m  11/30/23 21.71 kg/m  10/20/23 23.40 kg/m  10/19/23 23.39 kg/m     Physical Exam General: Sitting up, well-appearing Eyes: No icterus Neck: No JVP Pulmonary: Clear, good air excursion, normal work of breathing Cardiovascular: Warm no edema Abdomen: Nondistended   Assessment & Plan:   Dyspnea on exertion: Suspect multifactorial largely due to deconditioning given gradual decrease in activity with multiple medical complications and hospitalizations over the years.  As well as decline with age.  In the setting of baseline shortness of breath his whole life, no improvement after CABG in the early 2000's.  Likely element of cardiac dysfunction given his dilatation and mild MVR, suspect this  causes shortness of breath with transient pulmonary venous hypertension with normal physiology of exercise.  His chest parenchyma is relatively clear on serial chest images with exception of chronic pleural scarring or thickening.  Quite possible there is restrictive physiology from this.  PFTs performed indicative of air trapping.  Asthma suspected given prolonged symptoms.  Overall improved with Trelegy and albuterol  as needed.  Asthma: With lifelong or long term dyspnea on exertion.  Improved with steroid therapy.  Dyspnea symptoms better with Trelegy and albuterol .  ILD: Mild, peripheral interlobular septal thickening.  Not consistent with IPF.  Possible NSIP.  Largely unchanged last few months.  Not present in 2020 but 2020 scan has severe infiltrates.  I wonder or think more likely this is postinfectious fibrosis.  Discussed antifibrotic's, risk felt to outweigh benefits.   Return in about 6 months (around 07/05/2024) for f/u Dr. Annella.   Donnice JONELLE Annella, MD 01/05/2024

## 2024-01-05 NOTE — Telephone Encounter (Signed)
° °  Pre-operative Risk Assessment    Patient Name: [name]  DOB: July 23, 1940 MRN: [id number]   Date of last office visit: 12/06/2023 with Dr. Court  Date of next office visit: 01/18/2024 with Dr. Francyne  Request for Surgical Clearance    Procedure:  Cataract Extraction by phacoemulsification with Intraocular lens implant - Left eye and Goniotomy on 01/24/24. We plan to do the same procedures for the right eye on 02/14/2024   Date of Surgery:  Clearance 01/24/24                                 Surgeon:  Dr. Corbin Socks Group or Practice Name:  [facility]  Phone number:  [phone] Fax number:  [phone]   Type of Clearance Requested:   - Medical  - Pharmacy:  Hold -Holding the following medications perioperatively for each surgery as noted above due to needing goniotomy (cataract surgery by itself does not need antithrombotic hold, but goniotomy does). - Eliquis  2-3 days prior and Plavix  5-7 days prior.       Type of Anesthesia:  -does not specify   Additional requests/questions:  Pt with recent hospitalization for CAP complicated by  HF exacerbation in 08/2023. Hospital course sounded largely as expected. Has done well since discharge. The surgical center where he is slated to have his surgery requires formal risk stratification for anyone who has had a HF exacerbation in the past 12 months. We appreciate your expertise and time.   SignedPatrcia Hong L   01/05/2024, 4:51 PM

## 2024-01-05 NOTE — Telephone Encounter (Signed)
 Attempted to contact surgeon's office about deferment of procedure date (TBD) until patient is seen in office on 12/30. LVMFCB

## 2024-01-05 NOTE — Telephone Encounter (Signed)
 Callback - can you please reach out to requesting provider's office and find out if OK to defer until patient seen by primary cardiologist 01/18/24? Very complex medical history, best served being seen in office before providing recommendation.   Will also route to pharm for input on OAC.

## 2024-01-05 NOTE — Patient Instructions (Signed)
Spiro/DLCO performed today. 

## 2024-01-06 NOTE — Telephone Encounter (Signed)
 Will update all parties involved per requesting office for patient to see cardiologist, Dec 30th. Clearance notes will be faxed once patient has been cleared by MD.

## 2024-01-06 NOTE — Telephone Encounter (Signed)
° °  Name: [name]  DOB: 1940-06-05  MRN: [id number]  Primary Cardiologist: Jerel Balding, MD  Chart reviewed as part of pre-operative protocol coverage. Because of [name]'s past medical history and time since last visit, he will require a follow-up in-office visit in order to better assess preoperative cardiovascular risk.  Patient has an office visit scheduled on 01/18/2024 with Dr. Balding. Appointment notes have been updated to reflect need for pre-op evaluation.   Pre-op covering staff:  - Please contact requesting surgeon's office via preferred method (i.e, phone, fax) to inform them of need for appointment prior to surgery.  This message will also be routed to pharmacy pool for input on holding Eliquis  as requested below so that this information is available to the clearing provider at time of patient's appointment.   Barnie Hila, NP  01/06/2024, 10:36 AM

## 2024-01-06 NOTE — Telephone Encounter (Signed)
 Please advise holding Eliquis  prior to cataract and goniotomy on 1/5. Office visit on 12/30. Last labs August 2025.  Thank you!  DW

## 2024-01-06 NOTE — Telephone Encounter (Signed)
 Pt has appt with Dr. Francyne Dec 30th. Will update all parties involved. Thank you

## 2024-01-06 NOTE — Telephone Encounter (Signed)
 Office returned call and stated the OV date of 12/30 is fine and they do not schedule the procedure until the PT attends the appt.

## 2024-01-07 NOTE — Telephone Encounter (Signed)
 Patient with diagnosis of atrial fibrillation on Eliquis  for anticoagulation.    Procedure:  Cataract Extraction by phacoemulsification with Intraocular lens implant - Left eye and Goniotomy on 01/24/24. We plan to do the same procedures for the right eye on 02/14/2024    Date of Surgery:  Clearance 01/24/24     CHA2DS2-VASc Score = 6   This indicates a 9.7% annual risk of stroke. The patient's score is based upon: CHF History: 1 HTN History: 1 Diabetes History: 1 Stroke History: 0 Vascular Disease History: 1 Age Score: 2 Gender Score: 0    CrCl 16 Platelet count 315  Patient has not had an Afib/aflutter ablation in the last 3 months, DCCV within the last 4 weeks or a watchman implanted in the last 45 days   Per office protocol, patient can hold Eliquis  for 3 days prior to procedure. (Recommend 3 days due to low CrCl)    Patient will not need bridging with Lovenox  (enoxaparin ) around procedure.  **This guidance is not considered finalized until pre-operative APP has relayed final recommendations.**

## 2024-01-07 NOTE — Telephone Encounter (Signed)
" ° °  Patient Name: [name]  DOB: September 09, 1940 MRN: [id number]  Primary Cardiologist: Jerel Balding, MD  Chart reviewed as part of pre-operative protocol coverage. Pre-op clearance already addressed by colleagues in earlier phone notes. To summarize recommendations:  - Has an office visit with Dr. Balding 01/18/24. - Pharmacy recommendations: Per office protocol, patient can hold Eliquis  for 3 days prior to procedure (Recommend 3 days due to low CrCl). Patient will not need bridging with Lovenox  (enoxaparin ) around procedure.  Awaiting visit 01/18/24 for medical clearance. Will remove from the pool.  Jacob GORMAN Cleaves, NP 01/07/2024, 11:19 AM  "

## 2024-01-14 ENCOUNTER — Other Ambulatory Visit: Payer: Self-pay | Admitting: Pulmonary Disease

## 2024-01-17 ENCOUNTER — Other Ambulatory Visit: Payer: Self-pay | Admitting: Cardiovascular Disease

## 2024-01-17 DIAGNOSIS — R0609 Other forms of dyspnea: Secondary | ICD-10-CM

## 2024-01-18 ENCOUNTER — Ambulatory Visit: Attending: Cardiovascular Disease | Admitting: Cardiovascular Disease

## 2024-01-18 ENCOUNTER — Encounter: Payer: Self-pay | Admitting: Cardiovascular Disease

## 2024-01-18 VITALS — BP 132/56 | HR 61 | Ht 68.0 in | Wt 150.0 lb

## 2024-01-18 DIAGNOSIS — I5020 Unspecified systolic (congestive) heart failure: Secondary | ICD-10-CM | POA: Diagnosis not present

## 2024-01-18 DIAGNOSIS — R0609 Other forms of dyspnea: Secondary | ICD-10-CM

## 2024-01-18 DIAGNOSIS — I483 Typical atrial flutter: Secondary | ICD-10-CM

## 2024-01-18 DIAGNOSIS — I739 Peripheral vascular disease, unspecified: Secondary | ICD-10-CM | POA: Diagnosis not present

## 2024-01-18 DIAGNOSIS — I25810 Atherosclerosis of coronary artery bypass graft(s) without angina pectoris: Secondary | ICD-10-CM

## 2024-01-18 DIAGNOSIS — N184 Chronic kidney disease, stage 4 (severe): Secondary | ICD-10-CM

## 2024-01-18 DIAGNOSIS — I700 Atherosclerosis of aorta: Secondary | ICD-10-CM | POA: Diagnosis not present

## 2024-01-18 DIAGNOSIS — D6869 Other thrombophilia: Secondary | ICD-10-CM

## 2024-01-18 DIAGNOSIS — E785 Hyperlipidemia, unspecified: Secondary | ICD-10-CM | POA: Diagnosis not present

## 2024-01-18 DIAGNOSIS — Z01810 Encounter for preprocedural cardiovascular examination: Secondary | ICD-10-CM | POA: Diagnosis not present

## 2024-01-18 DIAGNOSIS — I10 Essential (primary) hypertension: Secondary | ICD-10-CM

## 2024-01-18 MED ORDER — NEBIVOLOL HCL 10 MG PO TABS: 5.0000 mg | ORAL_TABLET | Freq: Every day | ORAL | 3 refills | Status: AC

## 2024-01-18 NOTE — Patient Instructions (Signed)
 Medication Instructions:  Take a half dose of Isosorbide  Mononitrate for one week, then stop.  Hold clopidogrel  for 7 days before eye surgery Hold Apixaban  for 3 days before eye surgery. You can restart them both after your shoulder injection  *If you need a refill on your cardiac medications before your next appointment, please call your pharmacy*  Lab Work: None ordered If you have labs (blood work) drawn today and your tests are completely normal, you will receive your results only by: MyChart Message (if you have MyChart) OR A paper copy in the mail If you have any lab test that is abnormal or we need to change your treatment, we will call you to review the results.  Testing/Procedures: None ordered  Follow-Up: At [facility], you and your health needs are our priority.  As part of our continuing mission to provide you with exceptional heart care, our providers are all part of one team.  This team includes your primary Cardiologist (physician) and Advanced Practice Providers or APPs (Physician Assistants and Nurse Practitioners) who all work together to provide you with the care you need, when you need it.  Your next appointment:   6 month(s)  Provider:   Jerel Balding, MD    We recommend signing up for the patient portal called MyChart.  Sign up information is provided on this After Visit Summary.  MyChart is used to connect with patients for Virtual Visits (Telemedicine).  Patients are able to view lab/test results, encounter notes, upcoming appointments, etc.  Non-urgent messages can be sent to your provider as well.   To learn more about what you can do with MyChart, go to [email or web address].

## 2024-01-19 ENCOUNTER — Other Ambulatory Visit: Payer: Self-pay | Admitting: Cardiovascular Disease

## 2024-01-21 DIAGNOSIS — I5020 Unspecified systolic (congestive) heart failure: Secondary | ICD-10-CM | POA: Insufficient documentation

## 2024-01-21 DIAGNOSIS — I700 Atherosclerosis of aorta: Secondary | ICD-10-CM | POA: Insufficient documentation

## 2024-01-21 MED ORDER — ROSUVASTATIN CALCIUM 10 MG PO TABS: 10.0000 mg | ORAL_TABLET | Freq: Every day | ORAL | 3 refills | Status: AC

## 2024-01-21 NOTE — Progress Notes (Signed)
 " Cardiology Office Note   Date:  01/21/2024  ID:  Cuyler, Vandyken 1940-06-15, MRN [id number] PCP: Theophilus Andrews, Tully GRADE, MD  [facility] Providers Cardiologist:  Jerel Balding, MD     History of Present Illness [name] is a 84 y.o. male who is a longtime patient of Dr. Charlena Sor, here to transition cardiology care after his retirement.  He has a long history of CAD (CABG times 04/1999, LIMA-LAD, SVG-OM, sequential SVG-PDA/PLA),  history of non-STEMI 2020 with placement of 3 overlapping DES in the sequential SVG to the branches of the right coronary artery, PAD (mild-moderate SFA stenosis bilaterally), history of heart failure with mildly reduced ejection fraction (most recently 50-55%), aortic atherosclerosis, dyslipidemia, CKD stage IV, COPD, OSA not on treatment with CPAP, history of right renal cell carcinoma s/p right nephrectomy, GERD with history of esophageal stenosis requiring dilation, history of biliary stricture requiring ERCP.  There was suspicion for mesenteric artery stenosis based on ultrasonography, but CO2 angiography showed this was only 30-40% and no intervention was necessary (07/26/2023).  He is generally doing well.  He denies dyspnea or angina with activity.  He has not taken nitroglycerin  in years.  He has had 3 falls over the last several months, likely none of them with head impact or other serious injuries.  They sound like mechanical falls, but note that his diastolic blood pressures in the 50s.  He is on the lower dose of Eliquis  due to renal dysfunction and age and he has not had any bleeding problems.  He has not had problems with lower extremity edema, orthopnea, PND.  He weighs daily on his home scale, which consistently shows a weight of about 148-150 pounds.  He has not been taking daily furosemide , but only as needed (none in the last month).  Activity is limited by bilateral knee pain.  He was hospitalized in August 2025 with pneumonia  and atrial flutter with heart failure exacerbation.  At that time he had his most recent echocardiogram showing EF 50-55%.  He had returned to normal sinus rhythm at the time of his office follow-up October 19, 2023.  His most recent functional study was a SPECT nuclear perfusion study in April 2024 that showed a fixed inferior wall defect and EF 56%.  ABI performed 11/10/2023 was normal, although the duplex ultrasound did show 50 to 74% bilateral mid SFA stenoses.  He is planning cataract surgery with goniotomy with Dr. Grissom as well as a steroid injection to his right shoulder with Dr. Burnetta and will require interruption in anticoagulation for both procedures.  His ECG today shows sinus rhythm and is a normal tracing.  Studies Reviewed EKG Interpretation Date/Time:  Tuesday January 18 2024 09:50:49 EST Ventricular Rate:  61 PR Interval:  164 QRS Duration:  98 QT Interval:  410 QTC Calculation: 412 R Axis:   28  Text Interpretation: Normal sinus rhythm Normal ECG When compared with ECG of 19-Oct-2023 09:05, Nonspecific T wave abnormality no longer evident in Lateral leads Confirmed by Addley Ballinger ([id number]) on 01/18/2024 9:58:50 AM     Risk Assessment/Calculations  CHA2DS2-VASc Score = 6   This indicates a 9.7% annual risk of stroke. The patient's score is based upon: CHF History: 1 HTN History: 1 Diabetes History: 1 Stroke History: 0 Vascular Disease History: 1 Age Score: 2 Gender Score: 0         Physical Exam VS:  BP (!) 132/56   Pulse 61   Ht  5' 8 (1.727 m)   Wt 150 lb (68 kg)   SpO2 96%   BMI 22.81 kg/m        Wt Readings from Last 3 Encounters:  01/18/24 150 lb (68 kg)  01/05/24 148 lb 3.2 oz (67.2 kg)  12/06/23 149 lb (67.6 kg)    GEN: Well nourished, well developed in no acute distress NECK: No JVD; No carotid bruits CARDIAC: RRR, no murmurs, rubs, gallops RESPIRATORY:  Clear to auscultation without rales, wheezing or rhonchi  ABDOMEN: Soft,  non-tender, non-distended EXTREMITIES:  No edema; No deformity   ASSESSMENT AND PLAN CHF:   Previous episodes of exacerbation have generally been associated with intercurrent illnesses and transient arrhythmia.  Currently appears well compensated.  Functional status limited more by his knees and gait stability than it is by any cardiac complaints.  Appears clinically euvolemic without taking any diuretics.  Has furosemide  available as needed(none in the last month). CAD: Had bypass surgery roughly 25 years ago and then required extensive revascularization of the SVG to the PDA and PLA, with 3 overlapping stents.  The plan is for him to be on lifelong antiplatelet therapy.  Currently asymptomatic.  On beta-blocker and lipid-lowering therapy.  Option to wean off the isosorbide .  Asked him to cut the dose in half for about a week and then discontinue it completely if he does not require nitroglycerin . AFlutter: Occurred during acute illness (cholecystitis, pneumonia, heart failure exacerbation) and resolved spontaneously.  On anticoagulants.  Currently in normal sinus rhythm. Anticoagulation: Increased risk of bleeding due to combination with antiplatelet therapy, but both appear to be indicated at this time and he has not had any serious bleeding issues.  Both can be interrupted for his planned cataract surgery and shoulder injection.  It would be great if these procedures could be performed in close temporal proximity, to event prolonged anticoagulant interruption. HTN: His diastolic blood pressure is actually quite low and we discussed the fact that this can promote falls.  Try to get him off the isosorbide .  The only other medication with hemodynamic effects that he takes is the Nebivolol , which we would prefer to continue. Aortic atherosclerosis/PAD: He does not have intermittent claudication, but rather his joints preclude more exertion.  He does not appear to have mesenteric angina or food aversion.   By recent angiography, the mesenteric artery stenosis does not appear to be severe.  The focus is on preventing disease progression. CKD4: Surgical single kidney.  GFR is around 25.  Avoid any contrast based procedures unless truly critical.  Avoid NSAIDs and other nephrotoxics. HLP: Most recent lipid profile is excellent with LDL cholesterol 44, albeit with a rather low HDL at 35.  Continue rosuvastatin  and ezetimibe . Preop CV risk: Both his cataract surgery and his shoulder injection are low risk procedures but will require temporary interruption of antiplatelet and anticoagulant therapy.  Since he has renal dysfunction he should stop the Eliquis  for 3 days.  He can resume both medications once he is cleared by his ophthalmologist and orthopedic surgeon.       Dispo:  Patient Instructions  Medication Instructions:  Take a half dose of Isosorbide  Mononitrate for one week, then stop.  Hold clopidogrel  for 7 days before eye surgery Hold Apixaban  for 3 days before eye surgery. You can restart them both after your shoulder injection  *If you need a refill on your cardiac medications before your next appointment, please call your pharmacy*  Lab Work: None ordered If you  have labs (blood work) drawn today and your tests are completely normal, you will receive your results only by: MyChart Message (if you have MyChart) OR A paper copy in the mail If you have any lab test that is abnormal or we need to change your treatment, we will call you to review the results.  Testing/Procedures: None ordered  Follow-Up: At [facility], you and your health needs are our priority.  As part of our continuing mission to provide you with exceptional heart care, our providers are all part of one team.  This team includes your primary Cardiologist (physician) and Advanced Practice Providers or APPs (Physician Assistants and Nurse Practitioners) who all work together to provide you with the care you  need, when you need it.  Your next appointment:   6 month(s)  Provider:   Jerel Balding, MD    We recommend signing up for the patient portal called MyChart.  Sign up information is provided on this After Visit Summary.  MyChart is used to connect with patients for Virtual Visits (Telemedicine).  Patients are able to view lab/test results, encounter notes, upcoming appointments, etc.  Non-urgent messages can be sent to your provider as well.   To learn more about what you can do with MyChart, go to [email or web address].      Signed, Jerel Balding, MD   "

## 2024-02-15 ENCOUNTER — Encounter: Admitting: Internal Medicine

## 2024-03-02 ENCOUNTER — Ambulatory Visit: Admitting: Adult Health

## 2024-05-22 ENCOUNTER — Encounter: Admitting: Internal Medicine

## 2024-07-06 ENCOUNTER — Ambulatory Visit: Admitting: Pulmonary Disease
